# Patient Record
Sex: Male | Born: 1960
Health system: Southern US, Community
[De-identification: ages and names within clinical notes are randomized; demographics above are authoritative.]

## PROBLEM LIST (undated history)

## (undated) DIAGNOSIS — I619 Nontraumatic intracerebral hemorrhage, unspecified: Secondary | ICD-10-CM

## (undated) DIAGNOSIS — J869 Pyothorax without fistula: Secondary | ICD-10-CM

## (undated) DIAGNOSIS — E119 Type 2 diabetes mellitus without complications: Secondary | ICD-10-CM

## (undated) DIAGNOSIS — G9340 Encephalopathy, unspecified: Secondary | ICD-10-CM

## (undated) DIAGNOSIS — I5020 Unspecified systolic (congestive) heart failure: Secondary | ICD-10-CM

## (undated) DIAGNOSIS — I429 Cardiomyopathy, unspecified: Secondary | ICD-10-CM

## (undated) DIAGNOSIS — L732 Hidradenitis suppurativa: Secondary | ICD-10-CM

## (undated) DIAGNOSIS — K659 Peritonitis, unspecified: Secondary | ICD-10-CM

## (undated) DIAGNOSIS — N289 Disorder of kidney and ureter, unspecified: Secondary | ICD-10-CM

## (undated) DIAGNOSIS — A419 Sepsis, unspecified organism: Secondary | ICD-10-CM

## (undated) DIAGNOSIS — I639 Cerebral infarction, unspecified: Secondary | ICD-10-CM

## (undated) DIAGNOSIS — K5090 Crohn's disease, unspecified, without complications: Secondary | ICD-10-CM

## (undated) DIAGNOSIS — I82409 Acute embolism and thrombosis of unspecified deep veins of unspecified lower extremity: Secondary | ICD-10-CM

## (undated) DIAGNOSIS — D649 Anemia, unspecified: Secondary | ICD-10-CM

## (undated) DIAGNOSIS — I2510 Atherosclerotic heart disease of native coronary artery without angina pectoris: Secondary | ICD-10-CM

## (undated) DIAGNOSIS — M009 Pyogenic arthritis, unspecified: Secondary | ICD-10-CM

## (undated) DIAGNOSIS — I10 Essential (primary) hypertension: Secondary | ICD-10-CM

## (undated) DIAGNOSIS — N186 End stage renal disease: Secondary | ICD-10-CM

## (undated) HISTORY — DX: Cardiomyopathy, unspecified: I42.9

## (undated) HISTORY — DX: Unspecified systolic (congestive) heart failure: I50.20

## (undated) HISTORY — DX: End stage renal disease: N18.6

## (undated) HISTORY — PX: ANGIOPLASTY: SHX39

## (undated) HISTORY — PX: ABDOMINAL SURGERY: SHX537

## (undated) HISTORY — PX: COLONOSCOPY: SHX174

## (undated) HISTORY — DX: Pyogenic arthritis, unspecified: M00.9

## (undated) HISTORY — DX: Sepsis, unspecified organism: A41.9

## (undated) HISTORY — DX: Atherosclerotic heart disease of native coronary artery without angina pectoris: I25.10

## (undated) HISTORY — DX: Peritonitis, unspecified: K65.9

---
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
## 2009-02-24 ENCOUNTER — Ambulatory Visit: Payer: Self-pay | Admitting: Family Medicine

## 2010-02-02 ENCOUNTER — Ambulatory Visit: Payer: Self-pay | Admitting: Nephrology

## 2011-08-03 DIAGNOSIS — K5090 Crohn's disease, unspecified, without complications: Secondary | ICD-10-CM | POA: Insufficient documentation

## 2011-08-21 ENCOUNTER — Emergency Department: Payer: Self-pay | Admitting: Emergency Medicine

## 2012-03-28 DIAGNOSIS — E11649 Type 2 diabetes mellitus with hypoglycemia without coma: Secondary | ICD-10-CM | POA: Insufficient documentation

## 2012-03-28 DIAGNOSIS — Z794 Long term (current) use of insulin: Secondary | ICD-10-CM | POA: Insufficient documentation

## 2012-03-28 DIAGNOSIS — R809 Proteinuria, unspecified: Secondary | ICD-10-CM | POA: Insufficient documentation

## 2012-03-28 DIAGNOSIS — E1129 Type 2 diabetes mellitus with other diabetic kidney complication: Secondary | ICD-10-CM | POA: Insufficient documentation

## 2013-01-25 DIAGNOSIS — N185 Chronic kidney disease, stage 5: Secondary | ICD-10-CM | POA: Insufficient documentation

## 2013-01-25 DIAGNOSIS — E559 Vitamin D deficiency, unspecified: Secondary | ICD-10-CM | POA: Insufficient documentation

## 2013-08-13 DIAGNOSIS — N4340 Spermatocele of epididymis, unspecified: Secondary | ICD-10-CM | POA: Insufficient documentation

## 2013-08-13 DIAGNOSIS — N138 Other obstructive and reflux uropathy: Secondary | ICD-10-CM | POA: Insufficient documentation

## 2013-09-11 DIAGNOSIS — R972 Elevated prostate specific antigen [PSA]: Secondary | ICD-10-CM | POA: Insufficient documentation

## 2013-11-02 DIAGNOSIS — N4231 Prostatic intraepithelial neoplasia: Secondary | ICD-10-CM | POA: Insufficient documentation

## 2014-07-24 ENCOUNTER — Ambulatory Visit: Payer: Self-pay | Admitting: Internal Medicine

## 2014-07-24 LAB — HEMOGLOBIN A1C: Hemoglobin A1C: 8.3 % — ABNORMAL HIGH (ref 4.2–6.3)

## 2014-07-24 LAB — CBC WITH DIFFERENTIAL/PLATELET
Basophil #: 0.1 10*3/uL (ref 0.0–0.1)
Basophil %: 0.3 %
Eosinophil #: 0 10*3/uL (ref 0.0–0.7)
Eosinophil %: 0.1 %
HCT: 29.2 % — ABNORMAL LOW (ref 40.0–52.0)
HGB: 8.8 g/dL — ABNORMAL LOW (ref 13.0–18.0)
Lymphocyte #: 2.2 10*3/uL (ref 1.0–3.6)
Lymphocyte %: 9 %
MCH: 25.2 pg — ABNORMAL LOW (ref 26.0–34.0)
MCHC: 30.2 g/dL — ABNORMAL LOW (ref 32.0–36.0)
MCV: 83 fL (ref 80–100)
Monocyte #: 1.2 x10 3/mm — ABNORMAL HIGH (ref 0.2–1.0)
Monocyte %: 5.1 %
Neutrophil #: 21 10*3/uL — ABNORMAL HIGH (ref 1.4–6.5)
Neutrophil %: 85.5 %
Platelet: 279 10*3/uL (ref 150–440)
RBC: 3.5 10*6/uL — ABNORMAL LOW (ref 4.40–5.90)
RDW: 15.7 % — ABNORMAL HIGH (ref 11.5–14.5)
WBC: 24.6 10*3/uL — ABNORMAL HIGH (ref 3.8–10.6)

## 2014-07-24 LAB — BASIC METABOLIC PANEL
Anion Gap: 10 (ref 7–16)
BUN: 62 mg/dL — ABNORMAL HIGH (ref 7–18)
Calcium, Total: 9.3 mg/dL (ref 8.5–10.1)
Chloride: 103 mmol/L (ref 98–107)
Co2: 21 mmol/L (ref 21–32)
Creatinine: 4.95 mg/dL — ABNORMAL HIGH (ref 0.60–1.30)
EGFR (African American): 16 — ABNORMAL LOW
EGFR (Non-African Amer.): 13 — ABNORMAL LOW
Glucose: 168 mg/dL — ABNORMAL HIGH (ref 65–99)
Osmolality: 290 (ref 275–301)
Potassium: 4.3 mmol/L (ref 3.5–5.1)
Sodium: 134 mmol/L — ABNORMAL LOW (ref 136–145)

## 2014-07-24 LAB — PROTEIN / CREATININE RATIO, URINE
Creatinine, Urine: 231.6 mg/dL — ABNORMAL HIGH (ref 30.0–125.0)
Protein, Random Urine: 225 mg/dL — ABNORMAL HIGH (ref 0–12)
Protein/Creat. Ratio: 971 mg/gCREAT — ABNORMAL HIGH (ref 0–200)

## 2014-08-16 DIAGNOSIS — I82409 Acute embolism and thrombosis of unspecified deep veins of unspecified lower extremity: Secondary | ICD-10-CM

## 2014-08-16 DIAGNOSIS — Z86718 Personal history of other venous thrombosis and embolism: Secondary | ICD-10-CM | POA: Insufficient documentation

## 2014-09-04 DIAGNOSIS — I872 Venous insufficiency (chronic) (peripheral): Secondary | ICD-10-CM | POA: Insufficient documentation

## 2014-10-18 DIAGNOSIS — I82409 Acute embolism and thrombosis of unspecified deep veins of unspecified lower extremity: Secondary | ICD-10-CM

## 2014-10-18 HISTORY — DX: Acute embolism and thrombosis of unspecified deep veins of unspecified lower extremity: I82.409

## 2015-01-20 DIAGNOSIS — E872 Acidosis, unspecified: Secondary | ICD-10-CM | POA: Insufficient documentation

## 2015-02-26 ENCOUNTER — Other Ambulatory Visit: Payer: Self-pay | Admitting: Family Medicine

## 2015-02-26 ENCOUNTER — Ambulatory Visit
Admission: RE | Admit: 2015-02-26 | Discharge: 2015-02-26 | Disposition: A | Discharge status: Home / Self Care | Payer: BLUE CROSS/BLUE SHIELD | Source: Ambulatory Visit | Attending: Family Medicine | Admitting: Family Medicine

## 2015-02-26 DIAGNOSIS — R042 Hemoptysis: Secondary | ICD-10-CM

## 2015-05-26 DIAGNOSIS — N189 Chronic kidney disease, unspecified: Secondary | ICD-10-CM | POA: Insufficient documentation

## 2015-05-26 DIAGNOSIS — D631 Anemia in chronic kidney disease: Secondary | ICD-10-CM | POA: Insufficient documentation

## 2015-08-25 ENCOUNTER — Other Ambulatory Visit: Payer: Self-pay | Admitting: Family Medicine

## 2015-08-25 DIAGNOSIS — N184 Chronic kidney disease, stage 4 (severe): Principal | ICD-10-CM

## 2015-08-25 DIAGNOSIS — E1122 Type 2 diabetes mellitus with diabetic chronic kidney disease: Secondary | ICD-10-CM

## 2015-08-29 ENCOUNTER — Other Ambulatory Visit: Payer: Self-pay | Admitting: Family Medicine

## 2015-08-29 DIAGNOSIS — R609 Edema, unspecified: Secondary | ICD-10-CM | POA: Insufficient documentation

## 2015-08-29 DIAGNOSIS — I999 Unspecified disorder of circulatory system: Secondary | ICD-10-CM | POA: Insufficient documentation

## 2015-08-29 DIAGNOSIS — E785 Hyperlipidemia, unspecified: Secondary | ICD-10-CM | POA: Insufficient documentation

## 2015-08-29 DIAGNOSIS — N529 Male erectile dysfunction, unspecified: Secondary | ICD-10-CM | POA: Insufficient documentation

## 2015-08-29 DIAGNOSIS — E7800 Pure hypercholesterolemia, unspecified: Secondary | ICD-10-CM | POA: Insufficient documentation

## 2015-08-29 DIAGNOSIS — I10 Essential (primary) hypertension: Secondary | ICD-10-CM | POA: Insufficient documentation

## 2015-08-29 DIAGNOSIS — M9979 Connective tissue and disc stenosis of intervertebral foramina of abdomen and other regions: Secondary | ICD-10-CM | POA: Insufficient documentation

## 2015-08-29 NOTE — Telephone Encounter (Signed)
Last OV 02/2015  Thanks,   -Mickel Baas

## 2015-09-28 ENCOUNTER — Other Ambulatory Visit: Payer: Self-pay | Admitting: Family Medicine

## 2015-09-28 DIAGNOSIS — I10 Essential (primary) hypertension: Secondary | ICD-10-CM

## 2015-10-23 ENCOUNTER — Other Ambulatory Visit: Payer: Self-pay | Admitting: Family Medicine

## 2015-10-23 DIAGNOSIS — E1122 Type 2 diabetes mellitus with diabetic chronic kidney disease: Secondary | ICD-10-CM

## 2015-10-23 DIAGNOSIS — N184 Chronic kidney disease, stage 4 (severe): Principal | ICD-10-CM

## 2015-10-23 NOTE — Telephone Encounter (Signed)
Last OV 02/2015  Thanks,   -Taelyn Broecker  

## 2015-11-06 ENCOUNTER — Emergency Department: Payer: BLUE CROSS/BLUE SHIELD

## 2015-11-06 ENCOUNTER — Inpatient Hospital Stay
Admission: EM | Admit: 2015-11-06 | Discharge: 2015-11-07 | DRG: 683 | Disposition: A | Discharge status: Home / Self Care | Payer: BLUE CROSS/BLUE SHIELD | Attending: Internal Medicine | Admitting: Internal Medicine

## 2015-11-06 ENCOUNTER — Encounter: Payer: Self-pay | Admitting: Emergency Medicine

## 2015-11-06 DIAGNOSIS — Z7682 Awaiting organ transplant status: Secondary | ICD-10-CM

## 2015-11-06 DIAGNOSIS — Z79899 Other long term (current) drug therapy: Secondary | ICD-10-CM

## 2015-11-06 DIAGNOSIS — E11649 Type 2 diabetes mellitus with hypoglycemia without coma: Secondary | ICD-10-CM | POA: Diagnosis present

## 2015-11-06 DIAGNOSIS — N184 Chronic kidney disease, stage 4 (severe): Secondary | ICD-10-CM | POA: Diagnosis present

## 2015-11-06 DIAGNOSIS — I120 Hypertensive chronic kidney disease with stage 5 chronic kidney disease or end stage renal disease: Secondary | ICD-10-CM | POA: Diagnosis present

## 2015-11-06 DIAGNOSIS — Z86718 Personal history of other venous thrombosis and embolism: Secondary | ICD-10-CM

## 2015-11-06 DIAGNOSIS — N179 Acute kidney failure, unspecified: Secondary | ICD-10-CM | POA: Diagnosis present

## 2015-11-06 DIAGNOSIS — Z992 Dependence on renal dialysis: Secondary | ICD-10-CM

## 2015-11-06 DIAGNOSIS — D631 Anemia in chronic kidney disease: Secondary | ICD-10-CM | POA: Diagnosis present

## 2015-11-06 DIAGNOSIS — Z881 Allergy status to other antibiotic agents status: Secondary | ICD-10-CM | POA: Diagnosis not present

## 2015-11-06 DIAGNOSIS — K5090 Crohn's disease, unspecified, without complications: Secondary | ICD-10-CM | POA: Diagnosis present

## 2015-11-06 DIAGNOSIS — Z794 Long term (current) use of insulin: Secondary | ICD-10-CM | POA: Diagnosis not present

## 2015-11-06 DIAGNOSIS — Z888 Allergy status to other drugs, medicaments and biological substances status: Secondary | ICD-10-CM | POA: Diagnosis not present

## 2015-11-06 DIAGNOSIS — E1122 Type 2 diabetes mellitus with diabetic chronic kidney disease: Secondary | ICD-10-CM | POA: Diagnosis present

## 2015-11-06 DIAGNOSIS — N186 End stage renal disease: Secondary | ICD-10-CM | POA: Diagnosis present

## 2015-11-06 DIAGNOSIS — N19 Unspecified kidney failure: Secondary | ICD-10-CM

## 2015-11-06 DIAGNOSIS — E162 Hypoglycemia, unspecified: Secondary | ICD-10-CM

## 2015-11-06 HISTORY — DX: Hidradenitis suppurativa: L73.2

## 2015-11-06 HISTORY — DX: Acute embolism and thrombosis of unspecified deep veins of unspecified lower extremity: I82.409

## 2015-11-06 HISTORY — DX: Crohn's disease, unspecified, without complications: K50.90

## 2015-11-06 HISTORY — DX: Anemia, unspecified: D64.9

## 2015-11-06 HISTORY — DX: Essential (primary) hypertension: I10

## 2015-11-06 HISTORY — DX: Disorder of kidney and ureter, unspecified: N28.9

## 2015-11-06 HISTORY — DX: Type 2 diabetes mellitus without complications: E11.9

## 2015-11-06 LAB — CBC
HCT: 26.6 % — ABNORMAL LOW (ref 40.0–52.0)
Hemoglobin: 8.5 g/dL — ABNORMAL LOW (ref 13.0–18.0)
MCH: 25.4 pg — ABNORMAL LOW (ref 26.0–34.0)
MCHC: 31.9 g/dL — ABNORMAL LOW (ref 32.0–36.0)
MCV: 79.3 fL — ABNORMAL LOW (ref 80.0–100.0)
Platelets: 179 10*3/uL (ref 150–440)
RBC: 3.35 MIL/uL — ABNORMAL LOW (ref 4.40–5.90)
RDW: 18.4 % — ABNORMAL HIGH (ref 11.5–14.5)
WBC: 7.1 10*3/uL (ref 3.8–10.6)

## 2015-11-06 LAB — GLUCOSE, CAPILLARY
Glucose-Capillary: 38 mg/dL — CL (ref 65–99)
Glucose-Capillary: 55 mg/dL — ABNORMAL LOW (ref 65–99)
Glucose-Capillary: 72 mg/dL (ref 65–99)
Glucose-Capillary: 119 mg/dL — ABNORMAL HIGH (ref 65–99)
Glucose-Capillary: 143 mg/dL — ABNORMAL HIGH (ref 65–99)
Glucose-Capillary: 143 mg/dL — ABNORMAL HIGH (ref 65–99)

## 2015-11-06 LAB — MAGNESIUM: Magnesium: 1.5 mg/dL — ABNORMAL LOW (ref 1.7–2.4)

## 2015-11-06 LAB — COMPREHENSIVE METABOLIC PANEL
ALT: 12 U/L — ABNORMAL LOW (ref 17–63)
AST: 17 U/L (ref 15–41)
Albumin: 3.3 g/dL — ABNORMAL LOW (ref 3.5–5.0)
Alkaline Phosphatase: 74 U/L (ref 38–126)
Anion gap: 12 (ref 5–15)
BUN: 67 mg/dL — ABNORMAL HIGH (ref 6–20)
CO2: 22 mmol/L (ref 22–32)
Calcium: 6.4 mg/dL — CL (ref 8.9–10.3)
Chloride: 106 mmol/L (ref 101–111)
Creatinine, Ser: 6.93 mg/dL — ABNORMAL HIGH (ref 0.61–1.24)
GFR calc Af Amer: 9 mL/min — ABNORMAL LOW (ref >60)
GFR calc non Af Amer: 8 mL/min — ABNORMAL LOW (ref >60)
Glucose, Bld: 42 mg/dL — CL (ref 65–99)
Potassium: 4.1 mmol/L (ref 3.5–5.1)
Sodium: 140 mmol/L (ref 135–145)
Total Bilirubin: 0.4 mg/dL (ref 0.3–1.2)
Total Protein: 7.8 g/dL (ref 6.5–8.1)

## 2015-11-06 MED ORDER — HEPARIN SODIUM (PORCINE) 5000 UNIT/ML IJ SOLN
5000.0000 [IU] | Freq: Three times a day (TID) | INTRAMUSCULAR | Status: DC
  Administered 2015-11-06 – 2015-11-07 (×2): 5000 [IU] via SUBCUTANEOUS
  Filled 2015-11-06 (×2): qty 1

## 2015-11-06 MED ORDER — MAGNESIUM SULFATE 2 GM/50ML IV SOLN
2.0000 g | Freq: Once | INTRAVENOUS | Status: AC
  Administered 2015-11-06: 2 g via INTRAVENOUS
  Filled 2015-11-06: qty 50

## 2015-11-06 MED ORDER — CARVEDILOL 12.5 MG PO TABS
12.5000 mg | ORAL_TABLET | Freq: Two times a day (BID) | ORAL | Status: DC
  Administered 2015-11-06 – 2015-11-07 (×2): 12.5 mg via ORAL
  Filled 2015-11-06 (×4): qty 1

## 2015-11-06 MED ORDER — SODIUM CHLORIDE 0.9 % IV SOLN
1.0000 g | Freq: Once | INTRAVENOUS | Status: AC
  Administered 2015-11-06: 1 g via INTRAVENOUS
  Filled 2015-11-06: qty 10

## 2015-11-06 MED ORDER — CALCITRIOL 0.25 MCG PO CAPS
0.5000 ug | ORAL_CAPSULE | ORAL | Status: DC
  Administered 2015-11-06 (×2): 0.5 ug via ORAL
  Filled 2015-11-06: qty 1

## 2015-11-06 MED ORDER — INSULIN ASPART 100 UNIT/ML ~~LOC~~ SOLN
0.0000 [IU] | Freq: Three times a day (TID) | SUBCUTANEOUS | Status: DC
Start: 2015-11-07 — End: 2015-11-07
  Administered 2015-11-07: 1 [IU] via SUBCUTANEOUS
  Filled 2015-11-06: qty 1

## 2015-11-06 MED ORDER — SODIUM CHLORIDE 0.9 % IV SOLN
INTRAVENOUS | Status: DC
  Administered 2015-11-06 – 2015-11-07 (×2): via INTRAVENOUS

## 2015-11-06 MED ORDER — AMLODIPINE BESYLATE 10 MG PO TABS
10.0000 mg | ORAL_TABLET | Freq: Every day | ORAL | Status: DC
  Administered 2015-11-06 – 2015-11-07 (×2): 10 mg via ORAL
  Filled 2015-11-06 (×2): qty 1

## 2015-11-06 MED ORDER — ACETAMINOPHEN 650 MG RE SUPP: 650.0000 mg | Freq: Four times a day (QID) | RECTAL | Status: DC | PRN

## 2015-11-06 MED ORDER — INSULIN ASPART 100 UNIT/ML ~~LOC~~ SOLN
0.0000 [IU] | Freq: Every day | SUBCUTANEOUS | Status: DC
Start: 2015-11-06 — End: 2015-11-07

## 2015-11-06 MED ORDER — ACETAMINOPHEN 325 MG PO TABS: 650.0000 mg | ORAL_TABLET | Freq: Four times a day (QID) | ORAL | Status: DC | PRN

## 2015-11-06 MED ORDER — PRAVASTATIN SODIUM 20 MG PO TABS
40.0000 mg | ORAL_TABLET | Freq: Every day | ORAL | Status: DC
  Administered 2015-11-06 – 2015-11-07 (×2): 40 mg via ORAL
  Filled 2015-11-06 (×3): qty 2

## 2015-11-06 MED ORDER — DEXTROSE 50 % IV SOLN
1.0000 | Freq: Once | INTRAVENOUS | Status: AC
  Administered 2015-11-06: 50 mL via INTRAVENOUS
  Filled 2015-11-06: qty 50

## 2015-11-06 MED ORDER — SODIUM CHLORIDE 0.9 % IV BOLUS (SEPSIS)
500.0000 mL | Freq: Once | INTRAVENOUS | Status: AC
  Administered 2015-11-06: 500 mL via INTRAVENOUS

## 2015-11-06 NOTE — ED Notes (Signed)
Admitting MD at bedside.

## 2015-11-06 NOTE — ED Provider Notes (Signed)
[facility] Emergency Department Provider Note  ____________________________________________  Time seen: Approximately 3:03 PM  I have reviewed the triage vital signs and the nursing notes.   HISTORY  Chief Complaint Hypoglycemia    HPI [name] is a 55 y.o. male with CKD IV, hypertension, Crohn's disease,  Anemia, history of DVT in the left lower extremity, not chronically anticoagulated, diabetes treated only with sliding scale Humalog and nightly Lantus who presents for evaluation of hypoglycemia today, currently severe, no modifying factors. The patient reports that he awoke this morning in his usual state of health and took 15 units of Humalog without checking his blood sugar. He reports he usually does this and there has been no change in his insulin regimen recently. He then ate a sausage biscuit and about an hour later began feeling "like my sugar was dropping". He reports that he was going to go to the vending machine to eat a candy bar however his coworker told him "I was acting funny". He does not think he fainted but his wife received a phone call from coworkers stating that he might have fainted. He reports he feels a little bit better after eating. He denies any pain complaints. He denies any chest pain, difficulty breathing, palpitations, headache, numbness, weakness. No recent cough, sneezing, runny nose, congestion, vomiting, diarrhea, fevers or chills. He was treated for cellulitis of the left leg several weeks ago but reports that that has resolved. He reports chronic intermittent swelling of the left lower extremity since his diagnosis of DVT approximately a year ago.   Past Medical History  Diagnosis Date  . Diabetes mellitus without complication ([place])   . Renal disorder   . Hypertension   . Crohn disease [facility])     Patient Active Problem List   Diagnosis Date Noted  . Narrowing of intervertebral disc space 08/29/2015  . Vascular  disorder of lower extremity 08/29/2015  . Failure of erection 08/29/2015  . Accumulation of fluid in tissues 08/29/2015  . Hypercholesteremia 08/29/2015  . BP (high blood pressure) 08/29/2015  . Deep vein thrombosis of lower extremity ([place]) 08/16/2014  . Abnormal prostate specific antigen 09/11/2013  . Benign prostatic hyperplasia with urinary obstruction 08/13/2013  . CD (Crohn's disease) ([place]) 02/28/2013  . Diabetes ([place]) 01/25/2013  . CKD stage 4 due to type 2 diabetes mellitus ([place]) 01/25/2013  . Chronic kidney disease (CKD), stage III (moderate) 03/28/2012  . Absolute anemia 08/03/2011    Past Surgical History  Procedure Laterality Date  . Abdominal surgery      Current Outpatient Rx  Name  Route  Sig  Dispense  Refill  . acetaminophen (TYLENOL) 500 MG tablet   Oral   Take 500 mg by mouth every 6 (six) hours as needed.         Marland Kitchen amLODipine (NORVASC) 10 MG tablet      TAKE ONE TABLET BY MOUTH ONCE DAILY   30 tablet   0     Pt needs to schedule a follow up office visit befo ...   . calcitRIOL (ROCALTROL) 0.25 MCG capsule   Oral   Take 0.5 mcg by mouth every other day.      1   . carvedilol (COREG) 12.5 MG tablet   Oral   Take 12.5 mg by mouth 2 (two) times daily.      3   . furosemide (LASIX) 40 MG tablet   Oral   Take 20-40 mg by mouth daily. Take 40  mg in morning and 20 mg at night      4   . LANTUS SOLOSTAR 100 UNIT/ML Solostar Pen      INJECT 45 UNITS SUBCUTANEOUSLY ONCE DAILY. INCREASE BY 1 UNIT A DAY UNTIL SUGARS ARE IMPROVED   15 pen   1   . pravastatin (PRAVACHOL) 40 MG tablet   Oral   Take 40 mg by mouth daily.      2     Allergies Vancomycin; Methotrexate; and Tape  No family history on file.  Social History Social History  Substance Use Topics  . Smoking status: Never Smoker   . Smokeless tobacco: None  . Alcohol Use: No    Review of Systems Constitutional: No fever/chills Eyes: No visual changes. ENT: No sore  throat. Cardiovascular: Denies chest pain. Respiratory: Denies shortness of breath. Gastrointestinal: No abdominal pain.  No nausea, no vomiting.  No diarrhea.  No constipation. Genitourinary: Negative for dysuria. Musculoskeletal: Negative for back pain. Skin: Negative for rash. Neurological: Negative for headaches, focal weakness or numbness.  10-point ROS otherwise negative.  ____________________________________________   PHYSICAL EXAM:  VITAL SIGNS: ED Triage Vitals  Enc Vitals Group     BP 11/06/15 1414 162/78 mmHg     Pulse Rate 11/06/15 1414 56     Resp 11/06/15 1414 16     Temp --      Temp src --      SpO2 11/06/15 1414 98 %     Weight 11/06/15 1414 250 lb (113.399 kg)     Height 11/06/15 1414 5' 10"  (1.778 m)     Head Cir --      Peak Flow --      Pain Score --      Pain Loc --      Pain Edu? --      Excl. in [place]? --     Constitutional: Alert and oriented. Well appearing and in no acute distress. Sitting up in bed, eating graham crackers, drinking milk. Eyes: Conjunctivae are normal. PERRL. EOMI. Head: Atraumatic. Nose: No congestion/rhinnorhea. Mouth/Throat: Mucous membranes are moist.  Oropharynx non-erythematous. Neck: No stridor. Cardiovascular: Normal rate, regular rhythm. Grossly normal heart sounds.  Good peripheral circulation. Respiratory: Normal respiratory effort.  No retractions. Lungs CTAB. Gastrointestinal: Soft and nontender. No distention. No CVA tenderness. Genitourinary: deferred Musculoskeletal: 2+ edema of the left lower extremity with venous stasis color change of the lower leg, palpable DP pulse bilaterally, no calf tenderness Neurologic:  Normal speech and language. No gross focal neurologic deficits are appreciated. No gait instability. 5 out of 5 strength in bilateral upper and lower extremities, sensation intact to light touch throughout. Cranial nerves II through XII intact. Skin:  Skin is warm, dry and intact. No rash  noted. Psychiatric: Mood and affect are normal. Speech and behavior are normal.  ____________________________________________   LABS (all labs ordered are listed, but only abnormal results are displayed)  Labs Reviewed  COMPREHENSIVE METABOLIC PANEL - Abnormal; Notable for the following:    Glucose, Bld 42 (*)    BUN 67 (*)    Creatinine, Ser 6.93 (*)    Calcium 6.4 (*)    Albumin 3.3 (*)    ALT 12 (*)    GFR calc non Af Amer 8 (*)    GFR calc Af Amer 9 (*)    All other components within normal limits  CBC - Abnormal; Notable for the following:    RBC 3.35 (*)    Hemoglobin 8.5 (*)  HCT 26.6 (*)    MCV 79.3 (*)    MCH 25.4 (*)    MCHC 31.9 (*)    RDW 18.4 (*)    All other components within normal limits  GLUCOSE, CAPILLARY - Abnormal; Notable for the following:    Glucose-Capillary 38 (*)    All other components within normal limits  GLUCOSE, CAPILLARY - Abnormal; Notable for the following:    Glucose-Capillary 55 (*)    All other components within normal limits  MAGNESIUM - Abnormal; Notable for the following:    Magnesium 1.5 (*)    All other components within normal limits  GLUCOSE, CAPILLARY - Abnormal; Notable for the following:    Glucose-Capillary 119 (*)    All other components within normal limits  GLUCOSE, CAPILLARY - Abnormal; Notable for the following:    Glucose-Capillary 143 (*)    All other components within normal limits  GLUCOSE, CAPILLARY - Abnormal; Notable for the following:    Glucose-Capillary 143 (*)    All other components within normal limits  GLUCOSE, CAPILLARY  CBG MONITORING, ED   ____________________________________________  EKG  ED ECG REPORT I, Joanne Gavel, the attending physician, personally viewed and interpreted this ECG.   Date: 11/06/2015  EKG Time: 14:21  Rate: 55  Rhythm: atrial fibrillation, rate 55  Axis: normal  Intervals: QTCs lightly prolonged at 526 ms.  ST&T Change: No acute ST elevation. No acute ST  depression. ____________________________________________  RADIOLOGY  CT head IMPRESSION: 1. No acute intracranial abnormalities. 2. Mild chronic microvascular ischemic changes in the cerebral white matter, as above.  CXR IMPRESSION: 1. No acute intracranial abnormalities. 2. Mild chronic microvascular ischemic changes in the cerebral white matter, as above.  Doppler ultrasound left leg  IMPRESSION: No evidence of deep venous thrombosis. ____________________________________________   PROCEDURES  Procedure(s) performed: None  Critical Care performed: No  ____________________________________________   INITIAL IMPRESSION / ASSESSMENT AND PLAN / ED COURSE  Pertinent labs & imaging results that were available during my care of the patient were reviewed by me and considered in my medical decision making (see chart for details).  Creedence B Asfour is a 55 y.o. male with CKD IV, hypertension, Crohn's disease, history of DVT in the left lower extremity, not chronically anticoagulated, diabetes treated only with sliding scale Humalog and nightly Lantus who presents for evaluation of hypoglycemia today as well as possibly altered mental status which has resolved. It is unclear whether not he had a fainting spell or not as there are conflicting details. On exam, he is very well-appearing and in no acute distress. Vital signs are stable, he is afebrile. He is an intact neurological examination. He is alert and oriented. His glucose on arrival was 38, improved only to 55 after eating. We'll give an amp of D50. We'll obtain basic labs, screening EKG. Given that it is unclear whether not he fainted today, we'll obtain screening x-ray and CT head. We'll check his blood sugar every hour and reassess for disposition.  ----------------------------------------- 6:45 PM on 11/06/2015 ----------------------------------------- Patient reports that he feels well. He has had a several normal glucoses  over the past 3 hours. Labs reviewed and are notable for a creatinine elevation at 6.93, baseline appears to be closer to 4.5. I have ordered IV fluids. Calcium is critically low at 6.4. We'll give calcium. Magnesium was low and repleted. QTC has narrowed from 526 ms to 513 ms on repeat EKG after Mg repletion. Case discussed with hospitalist for admission. Case discussed  with Dr. Holley Raring of nephrology as well. ____________________________________________   FINAL CLINICAL IMPRESSION(S) / ED DIAGNOSES  Final diagnoses:  Hypoglycemia      Joanne Gavel, MD 11/06/15 2315

## 2015-11-06 NOTE — ED Notes (Signed)
Blood Sugar 72

## 2015-11-06 NOTE — ED Notes (Signed)
Attempted to call report. The receiving nurse was in an isolation room. Gave my name and number and waiting for a call back.

## 2015-11-06 NOTE — ED Notes (Signed)
Brought in via ems from work  Per ems he had low blood sugar       Pt is diaphoretic and sl lethargic

## 2015-11-06 NOTE — H&P (Signed)
[facility] [facility] NAME: [name]    MR#:  [id number]  DATE OF BIRTH:  1961-07-24  DATE OF ADMISSION:  11/06/2015  PRIMARY CARE PHYSICIAN: Margarita Rana, MD   REQUESTING/REFERRING PHYSICIAN: Dr. Edd Fabian  CHIEF COMPLAINT:   Chief Complaint  Patient presents with  . Hypoglycemia    HISTORY OF PRESENT ILLNESS:  [name]  is a 55 y.o. male with a known history of end-stage renal disease approaching need for hemodialysis, diabetes mellitus type 2 requiring insulin, hypertension presents today with syncope due to hypoglycemia. He was in his usual state of health this morning, ate breakfast, took his usual dose of Humalog without checking his blood sugar. About 1 hour later he became jittery and sweaty, he was unable to find anything to eat and became disoriented. Coworkers saw him standing at the water fountain looking very confused. They put him in a chair and he had possible syncopal episode. He did not have any palpitation, chest pain, shortness of breath or focal neurologic changes. On presentation to the emergency room his blood sugar is 38. He is also found to be in acute on chronic renal failure with worsening creatinine from 5.9 in 09/2015 to 6.9 today.  PAST MEDICAL HISTORY:   Past Medical History  Diagnosis Date  . Diabetes mellitus without complication ([place])   . Renal disorder   . Hypertension   . Crohn disease ([place])   . Hidradenitis suppurativa   . Anemia   . DVT of lower extremity (deep venous thrombosis) ([place]) 2016    PAST SURGICAL HISTORY:   Past Surgical History  Procedure Laterality Date  . Abdominal surgery      SOCIAL HISTORY:   Social History  Substance Use Topics  . Smoking status: Never Smoker   . Smokeless tobacco: Not on file  . Alcohol Use: No    FAMILY HISTORY:   Family History  Problem Relation Age of Onset  . Irritable bowel syndrome Sister     DRUG ALLERGIES:   Allergies   Allergen Reactions  . Vancomycin Shortness Of Breath    Eyes watering, SOB, wheezing  . Methotrexate Other (See Comments)    Blood count drops  . Tape     REVIEW OF SYSTEMS:   Review of Systems  Constitutional: Positive for malaise/fatigue. Negative for fever, chills and weight loss.  HENT: Negative for congestion and hearing loss.   Eyes: Negative for blurred vision and pain.  Respiratory: Negative for cough, hemoptysis, sputum production, shortness of breath and stridor.   Cardiovascular: Positive for leg swelling. Negative for chest pain, palpitations and orthopnea.  Gastrointestinal: Negative for nausea, vomiting, abdominal pain, diarrhea, constipation and blood in stool.  Genitourinary: Negative for dysuria and frequency.  Musculoskeletal: Negative for myalgias, back pain, joint pain and neck pain.  Skin: Negative for rash.  Neurological: Positive for dizziness, tingling, loss of consciousness and weakness. Negative for focal weakness and headaches.  Endo/Heme/Allergies: Does not bruise/bleed easily.  Psychiatric/Behavioral: Negative for depression and hallucinations. The patient is not nervous/anxious.     MEDICATIONS AT HOME:   Prior to Admission medications   Medication Sig Start Date End Date Taking? Authorizing Provider  acetaminophen (TYLENOL) 500 MG tablet Take 500 mg by mouth every 6 (six) hours as needed.   Yes Historical Provider, MD  amLODipine (NORVASC) 10 MG tablet TAKE ONE TABLET BY MOUTH ONCE DAILY 08/29/15  Yes Margarita Rana, MD  calcitRIOL (ROCALTROL) 0.25 MCG capsule Take  0.5 mcg by mouth every other day. 11/02/15  Yes Historical Provider, MD  carvedilol (COREG) 12.5 MG tablet Take 12.5 mg by mouth 2 (two) times daily. 11/02/15  Yes Historical Provider, MD  furosemide (LASIX) 40 MG tablet Take 20-40 mg by mouth daily. Take 40 mg in morning and 20 mg at night 10/24/15  Yes Historical Provider, MD  LANTUS SOLOSTAR 100 UNIT/ML Solostar Pen INJECT 45 UNITS  SUBCUTANEOUSLY ONCE DAILY. INCREASE BY 1 UNIT A DAY UNTIL SUGARS ARE IMPROVED 10/23/15  Yes Margarita Rana, MD  pravastatin (PRAVACHOL) 40 MG tablet Take 40 mg by mouth daily. 10/24/15  Yes Historical Provider, MD      VITAL SIGNS:  Blood pressure 159/93, pulse 73, resp. rate 19, height 5\' 10"  (1.778 m), weight 113.399 kg (250 lb), SpO2 100 %.  PHYSICAL EXAMINATION:  GENERAL:  55 y.o.-year-old patient lying in the bed with no acute distress.  EYES: Pupils equal, round, reactive to light and accommodation. No scleral icterus. Extraocular muscles intact.  HEENT: Head atraumatic, normocephalic. Oropharynx and nasopharynx clear. Mucous membranes are pink and moist NECK:  Supple, no jugular venous distention. No thyroid enlargement, no tenderness.  LUNGS: Normal breath sounds bilaterally, no wheezing, rales,rhonchi or crepitation. No use of accessory muscles of respiration.  CARDIOVASCULAR: S1, S2 normal. No murmurs, rubs, or gallops.  ABDOMEN: Soft, nontender, nondistended. Bowel sounds present. No organomegaly or mass. No guarding no rebound EXTREMITIES: Trace pedal edema over right lower extremity, 1+ pedal edema over the left lower extremity with pitting, cyanosis, or clubbing.  NEUROLOGIC: Cranial nerves II through XII are intact. Muscle strength 5/5 in all extremities. Sensation intact. Gait not checked.  PSYCHIATRIC: The patient is alert and oriented x 3.  SKIN: No obvious rash, lesion, or ulcer.   LABORATORY PANEL:   CBC  Recent Labs Lab 11/06/15 1433  WBC 7.1  HGB 8.5*  HCT 26.6*  PLT 179   ------------------------------------------------------------------------------------------------------------------  Chemistries   Recent Labs Lab 11/06/15 1433  NA 140  K 4.1  CL 106  CO2 22  GLUCOSE 42*  BUN 67*  CREATININE 6.93*  CALCIUM 6.4*  MG 1.5*  AST 17  ALT 12*  ALKPHOS 74  BILITOT 0.4    ------------------------------------------------------------------------------------------------------------------  Cardiac Enzymes No results for input(s): TROPONINI in the last 168 hours. ------------------------------------------------------------------------------------------------------------------  RADIOLOGY:  Dg Chest 2 View  11/06/2015  CLINICAL DATA:  Pt had an episode at work today, his sugar dropped, syncope episode, edema in left leg EXAM: CHEST  2 VIEW COMPARISON:  02/26/2015 FINDINGS: The heart is enlarged. There are no focal consolidations or pleural effusions. No pulmonary edema. Mildly prominent interstitial markings appear stable. IMPRESSION: Cardiomegaly. Electronically Signed   By: Nolon Nations M.D.   On: 11/06/2015 16:54   Ct Head Wo Contrast  11/06/2015  CLINICAL DATA:  55 year old male with history of headaches and confusion. Low blood sugar. EXAM: CT HEAD WITHOUT CONTRAST TECHNIQUE: Contiguous axial images were obtained from the base of the skull through the vertex without intravenous contrast. COMPARISON:  No priors. FINDINGS: Patchy areas of decreased attenuation are noted throughout the deep and periventricular white matter of the cerebral hemispheres bilaterally, compatible with mild chronic microvascular ischemic disease. No acute intracranial abnormalities. Specifically, no evidence of acute intracranial hemorrhage, no definite findings of acute/subacute cerebral ischemia, no mass, mass effect, hydrocephalus or abnormal intra or extra-axial fluid collections. Visualized paranasal sinuses and mastoids are well pneumatized. No acute displaced skull fractures are identified. IMPRESSION: 1. No acute intracranial abnormalities. 2.  Mild chronic microvascular ischemic changes in the cerebral white matter, as above. Electronically Signed   By: Vinnie Langton M.D.   On: 11/06/2015 16:56   US Venous Img Lower Unilateral Left  11/06/2015  CLINICAL DATA:  55 year old male  with intermittent left lower extremity swelling for the past year EXAM: LEFT LOWER EXTREMITY VENOUS DOPPLER ULTRASOUND TECHNIQUE: Gray-scale sonography with graded compression, as well as color Doppler and duplex ultrasound were performed to evaluate the lower extremity deep venous systems from the level of the common femoral vein and including the common femoral, femoral, profunda femoral, popliteal and calf veins including the posterior tibial, peroneal and gastrocnemius veins when visible. The superficial great saphenous vein was also interrogated. Spectral Doppler was utilized to evaluate flow at rest and with distal augmentation maneuvers in the common femoral, femoral and popliteal veins. COMPARISON:  Prior duplex ultrasound 07/24/2014 FINDINGS: Contralateral Common Femoral Vein: Respiratory phasicity is normal and symmetric with the symptomatic side. No evidence of thrombus. Normal compressibility. Common Femoral Vein: No evidence of thrombus. Normal compressibility, respiratory phasicity and response to augmentation. Saphenofemoral Junction: No evidence of thrombus. Normal compressibility and flow on color Doppler imaging. Profunda Femoral Vein: No evidence of thrombus. Normal compressibility and flow on color Doppler imaging. Femoral Vein: No evidence of thrombus. Normal compressibility, respiratory phasicity and response to augmentation. Popliteal Vein: No evidence of thrombus. Normal compressibility, respiratory phasicity and response to augmentation. Calf Veins: No evidence of thrombus. Normal compressibility and flow on color Doppler imaging. Superficial Great Saphenous Vein: No evidence of thrombus. Normal compressibility and flow on color Doppler imaging. Venous Reflux:  None. Other Findings: Mildly prominent superficial inguinal lymph nodes in the left groin. These nodes were present on the prior ultrasound from October of 2015. There is been no significant interval change. IMPRESSION: No evidence of  deep venous thrombosis. Electronically Signed   By: Jacqulynn Cadet M.D.   On: 11/06/2015 17:28    EKG:   Orders placed or performed during the hospital encounter of 11/06/15  . ED EKG  . ED EKG  . EKG 12-Lead  . EKG 12-Lead  . ED EKG  . ED EKG  . EKG 12-Lead  . EKG 12-Lead    IMPRESSION AND PLAN:   1. Acute renal failure on CK D4: He has end-stage renal disease and is in the process of obtaining dialysis access as well as applying to the transplant list. Creatinine is worse today. We'll provide gentle hydration in hopes of improving renal function. [facility] consult nephrology. He usually follows with you and see nephrology.  2. Hypoglycemia: He reports that his oral intake has been normal. May need to adjust insulin doses due to worsening renal function. He has received 1 amp of D50 in the emergency room and blood sugars are now 143. Check A1c, start sliding scale insulin. Hold long acting insulin until blood sugars improve  3. Hypertension: Blood pressure well controlled at this time. Continue Norvasc and Coreg  4. Hypocalcemia: Has received calcium gluconate. Continue to monitor.  5. Hypomagnesemia: Has received 2 g of magnesium. Continue to monitor.  6. Anemia of chronic disease: Due to renal disease. Stable. Receives epogen through [facility] nephrology. No bleeding noted.  7. History of DVT: Reports that he took 6 months of warfarin and is now off of anticoagulation. Has chronic postphlebitic changes which are stable.  8. Prophylaxis: Heparin for DVT prophylaxis. No GI prophylaxis  All the records are reviewed and case discussed with ED provider. Management plans discussed  with the patient and his wife and they are in agreement.  CODE STATUS: Full  TOTAL TIME TAKING CARE OF THIS PATIENT:55 minutes.  Greater than 50% of time spent in coordination of care and counseling.  Myrtis Ser M.D on 11/06/2015 at 7:43 PM  Between 7am to 6pm - Pager - [phone]  After 6pm go to  [email or web address] - password [email or web address] [facility]  [place] [facility]  Office  [phone]  CC: Primary care physician; Margarita Rana, MD

## 2015-11-07 ENCOUNTER — Inpatient Hospital Stay: Payer: BLUE CROSS/BLUE SHIELD

## 2015-11-07 LAB — RENAL FUNCTION PANEL
Albumin: 2.8 g/dL — ABNORMAL LOW (ref 3.5–5.0)
Anion gap: 11 (ref 5–15)
BUN: 64 mg/dL — ABNORMAL HIGH (ref 6–20)
CO2: 21 mmol/L — ABNORMAL LOW (ref 22–32)
Calcium: 6.5 mg/dL — ABNORMAL LOW (ref 8.9–10.3)
Chloride: 108 mmol/L (ref 101–111)
Creatinine, Ser: 6.58 mg/dL — ABNORMAL HIGH (ref 0.61–1.24)
GFR calc Af Amer: 10 mL/min — ABNORMAL LOW (ref >60)
GFR calc non Af Amer: 9 mL/min — ABNORMAL LOW (ref >60)
Glucose, Bld: 73 mg/dL (ref 65–99)
Phosphorus: 6.8 mg/dL — ABNORMAL HIGH (ref 2.5–4.6)
Potassium: 4 mmol/L (ref 3.5–5.1)
Sodium: 140 mmol/L (ref 135–145)

## 2015-11-07 LAB — CBC
HCT: 24.7 % — ABNORMAL LOW (ref 40.0–52.0)
Hemoglobin: 8 g/dL — ABNORMAL LOW (ref 13.0–18.0)
MCH: 25.4 pg — ABNORMAL LOW (ref 26.0–34.0)
MCHC: 32.3 g/dL (ref 32.0–36.0)
MCV: 78.6 fL — ABNORMAL LOW (ref 80.0–100.0)
Platelets: 154 10*3/uL (ref 150–440)
RBC: 3.14 MIL/uL — ABNORMAL LOW (ref 4.40–5.90)
RDW: 18.7 % — ABNORMAL HIGH (ref 11.5–14.5)
WBC: 6.2 10*3/uL (ref 3.8–10.6)

## 2015-11-07 LAB — GLUCOSE, CAPILLARY
Glucose-Capillary: 100 mg/dL — ABNORMAL HIGH (ref 65–99)
Glucose-Capillary: 90 mg/dL (ref 65–99)
Glucose-Capillary: 137 mg/dL — ABNORMAL HIGH (ref 65–99)

## 2015-11-07 LAB — HEMOGLOBIN A1C: Hgb A1c MFr Bld: 7.1 % — ABNORMAL HIGH (ref 4.0–6.0)

## 2015-11-07 MED ORDER — INSULIN GLARGINE 100 UNIT/ML SOLOSTAR PEN: 10.0000 [IU] | PEN_INJECTOR | Freq: Every day | SUBCUTANEOUS | Status: DC

## 2015-11-07 NOTE — Discharge Summary (Signed)
[facility] [place] NAME: [name]    MR#:  [id number]  DATE OF BIRTH:  1961-08-26  DATE OF ADMISSION:  11/06/2015 ADMITTING PHYSICIAN: Aldean Jewett, MD  DATE OF DISCHARGE: 11/07/2015  PRIMARY CARE PHYSICIAN: Margarita Rana, MD    ADMISSION DIAGNOSIS:  Hypocalcemia [E83.51] Hypomagnesemia [E83.42] Renal failure [N19] Hypoglycemia [E16.2] Acute renal failure, unspecified acute renal failure type ([place]) [N17.9]  DISCHARGE DIAGNOSIS:  Active Problems:   Acute renal failure superimposed on stage 4 chronic kidney disease ([place])   SECONDARY DIAGNOSIS:   Past Medical History  Diagnosis Date  . Diabetes mellitus without complication ([place])   . Renal disorder   . Hypertension   . Crohn disease ([place])   . Hidradenitis suppurativa   . Anemia   . DVT of lower extremity (deep venous thrombosis) ([place]) 2016    HOSPITAL COURSE:   55 year old male with chronic kidney disease stage IV need for hemodialysis and type 2 diabetes who presented with hypoglycemia. Further details please refer the H&P.  1. Hypoglycemia: This is due to the fact the patient's kidney function is decreasing and he is continuing to take higher dose of insulin. His blood sugars have improved. Patient was seen by the diabetic coordinator. He will decrease Lantus to 10 units at bedtime. He will follow instructions as advised by the diabetes coordinator to monitor his blood sugars every before meals and daily at bedtime.  2. Chronic kidney disease stage IV/5: Patient is approaching dialysis. His creatinine baseline is approximate 6. His creatinine has improved with IV fluids. We also held Lasix. I spoke with [facility] nephrology on call. Patient has an appointment on Monday to see his regular nephrologist. Patient is asymptomatic and creatinine is decreasing. Renal ultrasound was normal. Electrolytes were within normal limits. Therefore patient will be discharged and have  follow-up as planned 8 AM on Monday morning with [facility] nephrology. He was told that if he should become symptomatic with shortness of breath, fatigue then he should come to the ER.  3. Anemia of chronic disease: This is due to renal disease. Patient will have follow-up on Monday for his anemia.  4. Essential hypertension: Patient will continue Norvasc and Coreg.  5. History of DVT: Patient is now off of anticoagulation and was treated for 6 months.  DISCHARGE CONDITIONS AND DIET:   Patient stable for discharge on a renal diet/diabetic diet  CONSULTS OBTAINED:  Treatment Team:  Anthonette Legato, MD  DRUG ALLERGIES:   Allergies  Allergen Reactions  . Vancomycin Shortness Of Breath    Eyes watering, SOB, wheezing  . Methotrexate Other (See Comments)    Blood count drops  . Tape     DISCHARGE MEDICATIONS:   Current Discharge Medication List    CONTINUE these medications which have CHANGED   Details  Insulin Glargine (LANTUS SOLOSTAR) 100 UNIT/ML Solostar Pen Inject 10 Units into the skin daily at 10 pm. Qty: 15 pen, Refills: 1   Associated Diagnoses: CKD stage 4 due to type 2 diabetes mellitus ([place])      CONTINUE these medications which have NOT CHANGED   Details  acetaminophen (TYLENOL) 500 MG tablet Take 500 mg by mouth every 6 (six) hours as needed.    amLODipine (NORVASC) 10 MG tablet TAKE ONE TABLET BY MOUTH ONCE DAILY Qty: 30 tablet, Refills: 0   Associated Diagnoses: Essential hypertension    calcitRIOL (ROCALTROL) 0.25 MCG capsule Take 0.5 mcg by mouth every other day. Refills:  1    carvedilol (COREG) 12.5 MG tablet Take 12.5 mg by mouth 2 (two) times daily. Refills: 3    pravastatin (PRAVACHOL) 40 MG tablet Take 40 mg by mouth daily. Refills: 2      STOP taking these medications     furosemide (LASIX) 40 MG tablet               Today   CHIEF COMPLAINT:  Patient is doing well this morning. Patient reports no chest pain, shortness of breath or  fatigue.  VITAL SIGNS:  Blood pressure 171/79, pulse 72, temperature 98.7 F (37.1 C), temperature source Oral, resp. rate 18, height 5' 10"  (1.778 m), weight 108.092 kg (238 lb 4.8 oz), SpO2 99 %.   REVIEW OF SYSTEMS:  Review of Systems  Constitutional: Negative for fever, chills and malaise/fatigue.  HENT: Negative for sore throat.   Eyes: Negative for blurred vision.  Respiratory: Negative for cough, hemoptysis, shortness of breath and wheezing.   Cardiovascular: Negative for chest pain, palpitations and leg swelling.  Gastrointestinal: Negative for nausea, vomiting, abdominal pain, diarrhea and blood in stool.  Genitourinary: Negative for dysuria.  Musculoskeletal: Negative for back pain.  Neurological: Negative for dizziness, tremors and headaches.  Endo/Heme/Allergies: Does not bruise/bleed easily.     PHYSICAL EXAMINATION:  GENERAL:  55 y.o.-year-old patient lying in the bed with no acute distress.  NECK:  Supple, no jugular venous distention. No thyroid enlargement, no tenderness.  LUNGS: Normal breath sounds bilaterally, no wheezing, rales,rhonchi  No use of accessory muscles of respiration.  CARDIOVASCULAR: S1, S2 normal. No murmurs, rubs, or gallops.  ABDOMEN: Soft, non-tender, non-distended. Bowel sounds present. No organomegaly or mass.  EXTREMITIES: No pedal edema, cyanosis, or clubbing.  PSYCHIATRIC: The patient is alert and oriented x 3.  SKIN: No obvious rash, lesion, or ulcer.   DATA REVIEW:   CBC  Recent Labs Lab 11/07/15 0446  WBC 6.2  HGB 8.0*  HCT 24.7*  PLT 154    Chemistries   Recent Labs Lab 11/06/15 1433 11/07/15 0406  NA 140 140  K 4.1 4.0  CL 106 108  CO2 22 21*  GLUCOSE 42* 73  BUN 67* 64*  CREATININE 6.93* 6.58*  CALCIUM 6.4* 6.5*  MG 1.5*  --   AST 17  --   ALT 12*  --   ALKPHOS 74  --   BILITOT 0.4  --     Cardiac Enzymes No results for input(s): TROPONINI in the last 168 hours.  Microbiology Results   @[email or web address] @  RADIOLOGY:  Dg Chest 2 View  11/06/2015  CLINICAL DATA:  Pt had an episode at work today, his sugar dropped, syncope episode, edema in left leg EXAM: CHEST  2 VIEW COMPARISON:  02/26/2015 FINDINGS: The heart is enlarged. There are no focal consolidations or pleural effusions. No pulmonary edema. Mildly prominent interstitial markings appear stable. IMPRESSION: Cardiomegaly. Electronically Signed   By: Nolon Nations M.D.   On: 11/06/2015 16:54   Ct Head Wo Contrast  11/06/2015  CLINICAL DATA:  55 year old male with history of headaches and confusion. Low blood sugar. EXAM: CT HEAD WITHOUT CONTRAST TECHNIQUE: Contiguous axial images were obtained from the base of the skull through the vertex without intravenous contrast. COMPARISON:  No priors. FINDINGS: Patchy areas of decreased attenuation are noted throughout the deep and periventricular white matter of the cerebral hemispheres bilaterally, compatible with mild chronic microvascular ischemic disease. No acute intracranial abnormalities. Specifically, no evidence of acute intracranial hemorrhage, no definite  findings of acute/subacute cerebral ischemia, no mass, mass effect, hydrocephalus or abnormal intra or extra-axial fluid collections. Visualized paranasal sinuses and mastoids are well pneumatized. No acute displaced skull fractures are identified. IMPRESSION: 1. No acute intracranial abnormalities. 2. Mild chronic microvascular ischemic changes in the cerebral white matter, as above. Electronically Signed   By: Vinnie Langton M.D.   On: 11/06/2015 16:56   US Renal  11/07/2015  CLINICAL DATA:  Renal failure EXAM: RENAL / URINARY TRACT ULTRASOUND COMPLETE COMPARISON:  None. FINDINGS: Right Kidney: Length: 13.5 cm. Mildly increased echotexture. No mass or hydronephrosis. Left Kidney: Length: 12.6 cm. Mildly increased echotexture. No mass or hydronephrosis. Bladder: Appears normal for degree of bladder distention. Mildly prominent  prostate. IMPRESSION: Mildly increased echotexture in the kidneys bilaterally suggesting chronic medical renal disease. No hydronephrosis. Electronically Signed   By: Rolm Baptise M.D.   On: 11/07/2015 10:25   US Venous Img Lower Unilateral Left  11/06/2015  CLINICAL DATA:  55 year old male with intermittent left lower extremity swelling for the past year EXAM: LEFT LOWER EXTREMITY VENOUS DOPPLER ULTRASOUND TECHNIQUE: Gray-scale sonography with graded compression, as well as color Doppler and duplex ultrasound were performed to evaluate the lower extremity deep venous systems from the level of the common femoral vein and including the common femoral, femoral, profunda femoral, popliteal and calf veins including the posterior tibial, peroneal and gastrocnemius veins when visible. The superficial great saphenous vein was also interrogated. Spectral Doppler was utilized to evaluate flow at rest and with distal augmentation maneuvers in the common femoral, femoral and popliteal veins. COMPARISON:  Prior duplex ultrasound 07/24/2014 FINDINGS: Contralateral Common Femoral Vein: Respiratory phasicity is normal and symmetric with the symptomatic side. No evidence of thrombus. Normal compressibility. Common Femoral Vein: No evidence of thrombus. Normal compressibility, respiratory phasicity and response to augmentation. Saphenofemoral Junction: No evidence of thrombus. Normal compressibility and flow on color Doppler imaging. Profunda Femoral Vein: No evidence of thrombus. Normal compressibility and flow on color Doppler imaging. Femoral Vein: No evidence of thrombus. Normal compressibility, respiratory phasicity and response to augmentation. Popliteal Vein: No evidence of thrombus. Normal compressibility, respiratory phasicity and response to augmentation. Calf Veins: No evidence of thrombus. Normal compressibility and flow on color Doppler imaging. Superficial Great Saphenous Vein: No evidence of thrombus. Normal  compressibility and flow on color Doppler imaging. Venous Reflux:  None. Other Findings: Mildly prominent superficial inguinal lymph nodes in the left groin. These nodes were present on the prior ultrasound from October of 2015. There is been no significant interval change. IMPRESSION: No evidence of deep venous thrombosis. Electronically Signed   By: Jacqulynn Cadet M.D.   On: 11/06/2015 17:28      Management plans discussed with the patient and he is in agreement. Stable for discharge   Patient should follow up with [facility] nephrology on Monday  CODE STATUS:     Code Status Orders        Start     Ordered   11/06/15 2036  Full code   Continuous     11/06/15 2035    Code Status History    Date Active Date Inactive Code Status Order ID Comments User Context   This patient has a current code status but no historical code status.      TOTAL TIME TAKING CARE OF THIS PATIENT: 35 minutes.    Note: This dictation was prepared with Dragon dictation along with smaller phrase technology. Any transcriptional errors that result from this process are unintentional.  Jaquin Coy M.D on 11/07/2015 at 11:30 AM  Between 7am to 6pm - Pager - [phone] After 6pm go to [email or web address] - password [email or web address]  [place] [facility]  Office  [phone]  CC: Primary care physician; Margarita Rana, MD

## 2015-11-07 NOTE — Progress Notes (Signed)
Inpatient Diabetes Program Recommendations  AACE/ADA: New Consensus Statement on Inpatient Glycemic Control (2015)  Target Ranges:  Prepandial:   less than 140 mg/dL      Peak postprandial:   less than 180 mg/dL (1-2 hours)      Critically ill patients:  140 - 180 mg/dL   Review of Glycemic Control  Results for EYTHAN, BEBEAU (MRN [id number]) as of 11/07/2015 09:19  Ref. Range 11/06/2015 15:16 11/06/2015 16:53 11/06/2015 18:01 11/06/2015 18:31 11/07/2015 08:01  Glucose-Capillary Latest Ref Range: 65-99 mg/dL 72 119 (H) 143 (H) 143 (H) 90    Diabetes history: Type 2 Outpatient Diabetes medications: Ordered- Lantus 45 units qhs, Humalog 15 units tid      Takes-Lantus 20-25 units qhs, Humalog 15 units tid Current orders for Inpatient glycemic control: Novolog correction 0-9 units tid, Novolog 0-5 units qhs  Inpatient Diabetes Program Recommendations: Spoke with patient- he Does Not take Humalog insulin as ordered.   Please consider ordering Lantus 10 units qhs and Novolog correction 0-9 units tid (will likely require a consistent Novolog mealtime insulin but will continue to follow and make recommendations)  Gentry Fitz, RN, BA, MHA, CDE Diabetes Coordinator Inpatient Diabetes Program  [phone] (Team Pager) [phone] ([place]) 11/07/2015 9:24 AM

## 2015-11-07 NOTE — Care Management (Signed)
I have notified [facility] dialysis liaison of patient admission.

## 2015-11-07 NOTE — Progress Notes (Signed)
Brief Nutrition Note:  Dietitian Consult for Diet Education received. Pt discharged from [facility]. Education materials mailed to address provided in [facility] on 11/07/2015.  Dwyane Luo, RD, LDN Pager [phone] Weekend/On-Call Pager [phone]

## 2016-02-04 DIAGNOSIS — M009 Pyogenic arthritis, unspecified: Secondary | ICD-10-CM | POA: Insufficient documentation

## 2016-02-04 HISTORY — DX: Pyogenic arthritis, unspecified: M00.9

## 2016-02-04 HISTORY — PX: KNEE SURGERY: SHX244

## 2016-02-10 ENCOUNTER — Telehealth: Payer: Self-pay | Admitting: Family Medicine

## 2016-02-10 NOTE — Telephone Encounter (Signed)
Talked with Dr. Nancie Neas, patient being discharged from [facility] on IV antibiotics.  Will sign paper work as needed.  Thanks.

## 2016-02-20 ENCOUNTER — Encounter: Payer: Self-pay | Admitting: Family Medicine

## 2016-02-20 ENCOUNTER — Ambulatory Visit (INDEPENDENT_AMBULATORY_CARE_PROVIDER_SITE_OTHER): Payer: BLUE CROSS/BLUE SHIELD | Admitting: Family Medicine

## 2016-02-20 VITALS — BP 146/76 | HR 64 | Temp 98.3°F | Resp 16 | Wt 232.0 lb

## 2016-02-20 DIAGNOSIS — E7800 Pure hypercholesterolemia, unspecified: Secondary | ICD-10-CM

## 2016-02-20 DIAGNOSIS — Z09 Encounter for follow-up examination after completed treatment for conditions other than malignant neoplasm: Secondary | ICD-10-CM

## 2016-02-20 DIAGNOSIS — R079 Chest pain, unspecified: Secondary | ICD-10-CM | POA: Insufficient documentation

## 2016-02-20 DIAGNOSIS — E1129 Type 2 diabetes mellitus with other diabetic kidney complication: Secondary | ICD-10-CM

## 2016-02-20 DIAGNOSIS — K120 Recurrent oral aphthae: Secondary | ICD-10-CM | POA: Insufficient documentation

## 2016-02-20 DIAGNOSIS — M009 Pyogenic arthritis, unspecified: Secondary | ICD-10-CM | POA: Diagnosis not present

## 2016-02-20 DIAGNOSIS — L732 Hidradenitis suppurativa: Secondary | ICD-10-CM | POA: Insufficient documentation

## 2016-02-20 DIAGNOSIS — M79606 Pain in leg, unspecified: Secondary | ICD-10-CM | POA: Insufficient documentation

## 2016-02-20 DIAGNOSIS — G629 Polyneuropathy, unspecified: Secondary | ICD-10-CM | POA: Insufficient documentation

## 2016-02-20 NOTE — Progress Notes (Signed)
Patient ID: [name], male   DOB: 03-14-1961, 55 y.o.   MRN: [id number]       Patient: [name] Male    DOB: 1961-10-16   55 y.o.   MRN: [id number] Visit Date: 02/20/2016  Today's Provider: Margarita Rana, MD   Chief Complaint  Patient presents with  . Hospitalization Follow-up   Subjective:    HPI  Follow up Hospitalization  Patient was admitted to [facility] on 02/03/2016 and discharged on 02/10/2016. He was treated for knee surgery. Treatment for this included. He reports excellent compliance with treatment. He reports this condition is Improved.   Reviewed hospital records and treatment plan.    ------------------------------------------------------------------------------------     Diabetes Mellitus Type II, Follow-up:   Lab Results  Component Value Date   HGBA1C 7.1* 11/06/2015   HGBA1C 8.3* 07/24/2014    Last seen for diabetes over 1 year ago.  Management since then includes no changes. He reports excellent compliance with treatment. He is not having side effects. Current symptoms include none and have been stable. Home blood sugar records: fasting 85-135  Episodes of hypoglycemia? Yes    Current Insulin Regimen: Novolog and Lantus Most Recent Eye Exam: no to date. Weight trend: stable Prior visit with dietician: no Current diet: in general, a "healthy" diet   Current exercise: none  Pertinent Labs:    Component Value Date/Time   CREATININE 6.58* 11/07/2015 0406   CREATININE 4.95* 07/24/2014 1524    Wt Readings from Last 3 Encounters:  02/20/16 232 lb (105.235 kg)  11/06/15 238 lb 4.8 oz (108.092 kg)    Also, has been taking his cholesterol medication without any difficulty.   Has not had labs checked in quite awhile.    ------------------------------------------------------------------------   Allergies  Allergen Reactions  . Vancomycin Shortness Of Breath    Eyes watering, SOB, wheezing  . Methotrexate Other (See Comments)   Blood count drops  . Tape    Previous Medications   ACETAMINOPHEN (TYLENOL) 500 MG TABLET    Take 500 mg by mouth every 6 (six) hours as needed.   AMLODIPINE (NORVASC) 10 MG TABLET    TAKE ONE TABLET BY MOUTH ONCE DAILY   CALCITRIOL (ROCALTROL) 0.25 MCG CAPSULE    Take 0.5 mcg by mouth every other day.   CARVEDILOL (COREG) 12.5 MG TABLET    Take 12.5 mg by mouth 2 (two) times daily.   FERROUS SULFATE 325 (65 FE) MG TABLET    Take 325 mg by mouth 2 (two) times daily with a meal.   FOLIC ACID (FOLVITE) 1 MG TABLET    Take 1 mg by mouth daily.   INSULIN ASPART (NOVOLOG) 100 UNIT/ML INJECTION    Inject 10 Units into the skin 3 (three) times daily before meals.   INSULIN GLARGINE (LANTUS SOLOSTAR) 100 UNIT/ML SOLOSTAR PEN    Inject 10 Units into the skin daily at 10 pm.   PRAVASTATIN (PRAVACHOL) 40 MG TABLET    Take 40 mg by mouth daily.   SODIUM BICARBONATE 650 MG TABLET    Take 650 mg by mouth 2 (two) times daily.    Review of Systems  Constitutional: Negative.   Endocrine: Negative.   Musculoskeletal: Positive for arthralgias.    Social History  Substance Use Topics  . Smoking status: Never Smoker   . Smokeless tobacco: Never Used  . Alcohol Use: No   Objective:   BP 146/76 mmHg  Pulse 64  Temp(Src) 98.3 F (36.8 C) (Oral)  Resp 16  Wt 232 lb (105.235 kg)  SpO2 100%  Physical Exam  Constitutional: He is oriented to person, place, and time. He appears well-developed and well-nourished.  Musculoskeletal:  Walking with cane. Left knee in brace.   Neurological: He is alert and oriented to person, place, and time.       Assessment & Plan:     1. Hospital discharge follow-up Stable. Follow up with ID as below.   2. Pyogenic arthritis of right knee joint, due to unspecified organism ([place]) Stable. Patient advised to continue amoxicillin 500 as prescribed by Dr. Uvaldo Rising ID from [facility].  3. Type 2 diabetes mellitus with other diabetic kidney complication (H[facility]) Stable. Patient  advised to continue current medication and plan of care. Will check labs.  - Hemoglobin A1c  4. Hypercholesteremia Condition is stable. Please continue current medication and  plan of care as noted.  Will check labs.   - Lipid Panel With LDL/HDL Ratio      Patient seen and examined by Dr. Jerrell Belfast, and note scribed by Philbert Riser. [place], CMA.  I have reviewed the document for accuracy and completeness and I agree with above. - Jerrell Belfast, MD   Margarita Rana, MD  [facility]

## 2016-02-21 LAB — LIPID PANEL WITH LDL/HDL RATIO
Cholesterol, Total: 167 mg/dL (ref 100–199)
HDL: 43 mg/dL (ref >39)
LDL Calculated: 97 mg/dL (ref 0–99)
LDl/HDL Ratio: 2.3 ratio units (ref 0.0–3.6)
Triglycerides: 136 mg/dL (ref 0–149)
VLDL Cholesterol Cal: 27 mg/dL (ref 5–40)

## 2016-02-21 LAB — HEMOGLOBIN A1C
Est. average glucose Bld gHb Est-mCnc: 177 mg/dL
Hgb A1c MFr Bld: 7.8 % — ABNORMAL HIGH (ref 4.8–5.6)

## 2016-02-23 ENCOUNTER — Telehealth: Payer: Self-pay

## 2016-02-23 NOTE — Telephone Encounter (Signed)
-----   Message from Margarita Rana, MD sent at 02/21/2016  8:05 AM EDT ----- Cholesterol looks good.   HgbA1c is  7.8.  Not as good as previous. May be related to infection.  Please continue to adjust insulin and recheck labs in 3 months with Dr. Caryn Section. Thanks.

## 2016-02-23 NOTE — Telephone Encounter (Signed)
Left message to call back  

## 2016-02-25 NOTE — Telephone Encounter (Signed)
Advised pt of lab results. Pt verbally acknowledges understanding. Raif Chachere Drozdowski, CMA   

## 2016-03-16 ENCOUNTER — Other Ambulatory Visit: Payer: Self-pay | Admitting: Family Medicine

## 2016-03-16 DIAGNOSIS — N529 Male erectile dysfunction, unspecified: Secondary | ICD-10-CM

## 2016-03-30 ENCOUNTER — Telehealth: Payer: Self-pay | Admitting: Family Medicine

## 2016-03-30 NOTE — Telephone Encounter (Signed)
Pt called stated that he was advised by [facility] that he needed to call and schedule an appt to get a TB skin test. I advised that we don't do TB skin test in the office, we order the blood test for patients to have done at the lab. I advised pt that a message has been sent to Dr. Venia Minks. Please advise Thanks TNP

## 2016-03-30 NOTE — Telephone Encounter (Signed)
Hassan Rowan that we do not perform PPD skin tests, and would order the Quantiferon Gold. Reports their office uses [facility] as well, and may be able to order the lab themselves. Lovey Newcomer is going to report this to Dr. Johnney Ou, and call back if needed. Renaldo Fiddler, CMA

## 2016-03-30 NOTE — Telephone Encounter (Signed)
Please review-aa 

## 2016-03-30 NOTE — Telephone Encounter (Signed)
Lovey Newcomer [facility] ) calling from Dr. Johnney Ou office  to see if Dr. Venia Minks would do a PPD Placement tomorrow morning on pt. Please call [facility] @ [phone]. Thanks CC

## 2016-03-30 NOTE — Telephone Encounter (Signed)
Ok to order. Takes a few days. Needs to get soon. Thanks.

## 2016-03-30 NOTE — Telephone Encounter (Signed)
Sandy calling back to check on status of her message from earlier. Lovey Newcomer is wanting a return call by this afternoon if possible.  Thanks CC

## 2016-04-12 DIAGNOSIS — Z992 Dependence on renal dialysis: Secondary | ICD-10-CM

## 2016-04-12 DIAGNOSIS — N186 End stage renal disease: Secondary | ICD-10-CM | POA: Insufficient documentation

## 2016-04-12 DIAGNOSIS — L03116 Cellulitis of left lower limb: Secondary | ICD-10-CM | POA: Insufficient documentation

## 2016-04-20 ENCOUNTER — Other Ambulatory Visit: Payer: Self-pay | Admitting: Family Medicine

## 2016-04-20 DIAGNOSIS — E1129 Type 2 diabetes mellitus with other diabetic kidney complication: Secondary | ICD-10-CM

## 2016-04-22 NOTE — Telephone Encounter (Signed)
LOV 02/20/2016. Has appt 04/27/2016. Renaldo Fiddler, CMA

## 2016-04-27 ENCOUNTER — Ambulatory Visit (INDEPENDENT_AMBULATORY_CARE_PROVIDER_SITE_OTHER): Payer: BLUE CROSS/BLUE SHIELD | Admitting: Family Medicine

## 2016-04-27 ENCOUNTER — Encounter: Payer: Self-pay | Admitting: Family Medicine

## 2016-04-27 VITALS — BP 116/64 | HR 68 | Temp 98.4°F | Resp 16 | Wt 213.0 lb

## 2016-04-27 DIAGNOSIS — N529 Male erectile dysfunction, unspecified: Secondary | ICD-10-CM

## 2016-04-27 DIAGNOSIS — L03116 Cellulitis of left lower limb: Secondary | ICD-10-CM

## 2016-04-27 MED ORDER — SILDENAFIL CITRATE 100 MG PO TABS: 100.0000 mg | ORAL_TABLET | Freq: Every day | ORAL | Status: DC | PRN

## 2016-04-27 NOTE — Progress Notes (Signed)
Patient: [name] Male    DOB: 09/17/1961   55 y.o.   MRN: [id number] Visit Date: 04/27/2016  Today's Provider: [name], MD   Chief Complaint  Patient presents with  . Hospitalization Follow-up  . Cellulitis    Left leg   Subjective:    HPI    Follow up Hospitalization  Patient was admitted to [facility] on 04/12/2016 and discharged on 06/272017. He was treated for Cellulitis on his left leg. Treatment for this included IV linezolid and was discharged on Clindamycin. He was also given PRBC. PVL was negative for DVT He states he has been to dialysis since discharge and states has had labs checked.  He reports excellent compliance with treatment. He reports this condition is Improved.  ------------------------------------------------------------------------------------     Allergies  Allergen Reactions  . Vancomycin Shortness Of Breath    Eyes watering, SOB, wheezing  . Methotrexate Other (See Comments)    Blood count drops  . Tape    Current Meds  Medication Sig  . acetaminophen (TYLENOL) 500 MG tablet Take 500 mg by mouth every 6 (six) hours as needed.  Marland Kitchen amLODipine (NORVASC) 10 MG tablet TAKE ONE TABLET BY MOUTH ONCE DAILY  . calcitRIOL (ROCALTROL) 0.25 MCG capsule Take 0.5 mcg by mouth 2 (two) times daily.   . carvedilol (COREG) 12.5 MG tablet Take 12.5 mg by mouth 2 (two) times daily.  . clindamycin (CLEOCIN) 150 MG capsule   . ferrous sulfate 325 (65 FE) MG tablet Take 325 mg by mouth 2 (two) times daily with a meal.  . folic acid (FOLVITE) 1 MG tablet Take 1 mg by mouth daily.  Marland Kitchen inFLIXimab (REMICADE) 100 MG injection Inject into the vein every 6 (six) weeks.   . ONE TOUCH ULTRA TEST test strip USE TO CHECK BLOOD SUGAR THREE TIMES DAILY  . pravastatin (PRAVACHOL) 40 MG tablet Take 40 mg by mouth daily.  . sodium bicarbonate 650 MG tablet Take 650 mg by mouth 2 (two) times daily.  Marland Kitchen VIAGRA 100 MG tablet TAKE ONE TABLET BY MOUTH EVERY DAY AS  NEEDED    Review of Systems  Constitutional: Negative.   Respiratory: Negative.   Cardiovascular: Positive for leg swelling (Left leg swelling; secondary to cellulitis. Pt reports it has improved greatly. ). Negative for chest pain and palpitations.  Gastrointestinal: Positive for nausea (Pt reports having occasional nausea secondary to the antibiotic. ). Negative for vomiting, abdominal pain, diarrhea, constipation, blood in stool, abdominal distention, anal bleeding and rectal pain.  Neurological: Negative for dizziness, light-headedness and headaches.    Social History  Substance Use Topics  . Smoking status: Never Smoker   . Smokeless tobacco: Never Used  . Alcohol Use: No   Objective:   BP 116/64 mmHg  Pulse 68  Temp(Src) 98.4 F (36.9 C) (Oral)  Resp 16  Wt 213 lb (96.616 kg)  Physical Exam   General Appearance:    Alert, cooperative, no distress  Eyes:    PERRL, conjunctiva/corneas clear, EOM's intact       Lungs:     Clear to auscultation bilaterally, respirations unlabored  Heart:    Regular rate and rhythm  Neurologic:   Awake, alert, oriented x 3. No apparent focal neurological           defect.   Extremities:   2+ edema of left lower leg from ankle to proximal calf. No erythema. Not tender. Not unusually warm to touch.  Assessment & Plan:     1. Cellulitis of left lower extremity Greatly improved. Has been recurrent problem likely due to ESRD. He is to finish up clindamycin and follow up in 2 weeks to make sure no recurrence. Will also be due for A1c.   2. Erectile dysfunction, unspecified erectile dysfunction type Requests refill for Viagra.  - sildenafil (VIAGRA) 100 MG tablet; Take 1 tablet (100 mg total) by mouth daily as needed.  Dispense: 6 tablet; Refill: 1       [name], MD  [facility]

## 2016-05-11 ENCOUNTER — Encounter: Payer: Self-pay | Admitting: Family Medicine

## 2016-05-11 ENCOUNTER — Ambulatory Visit (INDEPENDENT_AMBULATORY_CARE_PROVIDER_SITE_OTHER): Payer: BLUE CROSS/BLUE SHIELD | Admitting: Family Medicine

## 2016-05-11 VITALS — BP 100/60 | HR 72 | Temp 98.7°F | Resp 16 | Ht 70.0 in | Wt 214.0 lb

## 2016-05-11 DIAGNOSIS — E1165 Type 2 diabetes mellitus with hyperglycemia: Secondary | ICD-10-CM | POA: Diagnosis not present

## 2016-05-11 DIAGNOSIS — L03116 Cellulitis of left lower limb: Secondary | ICD-10-CM

## 2016-05-11 DIAGNOSIS — E1122 Type 2 diabetes mellitus with diabetic chronic kidney disease: Secondary | ICD-10-CM

## 2016-05-11 DIAGNOSIS — Z794 Long term (current) use of insulin: Secondary | ICD-10-CM

## 2016-05-11 DIAGNOSIS — N186 End stage renal disease: Secondary | ICD-10-CM

## 2016-05-11 DIAGNOSIS — Z992 Dependence on renal dialysis: Secondary | ICD-10-CM

## 2016-05-11 DIAGNOSIS — E1129 Type 2 diabetes mellitus with other diabetic kidney complication: Secondary | ICD-10-CM

## 2016-05-11 LAB — POCT GLYCOSYLATED HEMOGLOBIN (HGB A1C): Hemoglobin A1C: 8.7

## 2016-05-11 MED ORDER — INSULIN GLARGINE 100 UNIT/ML SOLOSTAR PEN: 24.0000 [IU] | PEN_INJECTOR | Freq: Every day | SUBCUTANEOUS | 3 refills | Status: DC

## 2016-05-11 NOTE — Progress Notes (Signed)
Patient: [name] Male    DOB: [birth year]   55 y.o.   MRN: [id number] Visit Date: 05/11/2016  Today's Provider: [name], MD   Chief Complaint  Patient presents with  . Follow-up  . Diabetes  . Cellulitis   Subjective:    HPI   Cellulitis of left lower extremity Greatly improved. Has been recurrent problem likely due to ESRD. He is to finish up clindamycin and follow up in 2 weeks to make sure no recurrence. Symptoms have improved.    Diabetes Mellitus Type II, Follow-up:   Lab Results  Component Value Date   HGBA1C 7.8 (H) 02/20/2016   HGBA1C 7.1 (H) 11/06/2015   HGBA1C 8.3 (H) 07/24/2014   Last seen for diabetes 2 months ago.  Management since then includes; no changes. He reports good compliance with treatment. He is not having side effects. none Current symptoms include none and have been unchanged. Home blood sugar records: fasting range: 200's  Episodes of hypoglycemia? no   Current Insulin Regimen: 20 units Lantus at bedtime and 5-8 units before meals.  Most Recent Eye Exam: due Weight trend: stable Prior visit with dietician: no Current diet: in general, an "unhealthy" diet Current exercise: none  ----------------------------------------------------------------    Allergies  Allergen Reactions  . Vancomycin Shortness Of Breath    Eyes watering, SOB, wheezing  . Methotrexate Other (See Comments)    Blood count drops  . Tape    Current Meds  Medication Sig  . acetaminophen (TYLENOL) 500 MG tablet Take 500 mg by mouth every 6 (six) hours as needed.  Marland Kitchen amLODipine (NORVASC) 10 MG tablet TAKE ONE TABLET BY MOUTH ONCE DAILY  . calcitRIOL (ROCALTROL) 0.25 MCG capsule Take 0.5 mcg by mouth 2 (two) times daily.   . carvedilol (COREG) 12.5 MG tablet Take 12.5 mg by mouth 2 (two) times daily.  . ferrous sulfate 325 (65 FE) MG tablet Take 325 mg by mouth 2 (two) times daily with a meal.  . folic acid (FOLVITE) 1 MG tablet Take 1 mg by  mouth daily.  Marland Kitchen inFLIXimab (REMICADE) 100 MG injection Inject into the vein every 6 (six) weeks.   Marland Kitchen LANTUS SOLOSTAR 100 UNIT/ML Solostar Pen INJECT 45 UNITS SUBCUTANEOUSLY ONCE DAILY. INCREASE BY 1 UNIT A DAY UNTIL SUGARS ARE IMPROVED (Patient taking differently: Inject 15-20 units at bedtime.)  . NOVOLOG FLEXPEN 100 UNIT/ML FlexPen INJECT 15 UNITS SUBCUTANEOUSLY THREE TIMES DAILY (Patient taking differently: Inject 5-6 Units before a meal.)  . ONE TOUCH ULTRA TEST test strip USE TO CHECK BLOOD SUGAR THREE TIMES DAILY  . pravastatin (PRAVACHOL) 40 MG tablet Take 40 mg by mouth daily.  . sildenafil (VIAGRA) 100 MG tablet Take 1 tablet (100 mg total) by mouth daily as needed.  . sodium bicarbonate 650 MG tablet Take 650 mg by mouth 2 (two) times daily.    Review of Systems  Constitutional: Negative for appetite change, chills and fever.  Respiratory: Negative for chest tightness, shortness of breath and wheezing.   Cardiovascular: Negative for chest pain and palpitations.  Gastrointestinal: Negative for abdominal pain, nausea and vomiting.    Social History  Substance Use Topics  . Smoking status: Never Smoker  . Smokeless tobacco: Never Used  . Alcohol use No   Objective:   BP 100/60 (BP Location: Left Arm, Patient Position: Sitting, Cuff Size: Large)   Pulse 72   Temp 98.7 F (37.1 C) (Oral)   Resp 16  Ht 5\' 10"  (1.778 m)   Wt 214 lb (97.1 kg)   SpO2 98%   BMI 30.71 kg/m   Physical Exam   General Appearance:    Alert, cooperative, no distress  Eyes:    PERRL, conjunctiva/corneas clear, EOM's intact       Lungs:     Clear to auscultation bilaterally, respirations unlabored  Heart:    Regular rate and rhythm  Neurologic:   Awake, alert, oriented x 3. No apparent focal neurological           defect.   Ext:  2+ edema left LE, tr on right. No redness, tenderness or erythema.    A1c=8.7      Assessment & Plan:     1. Uncontrolled type 2 diabetes mellitus with chronic  kidney disease, with long-term current use of insulin, unspecified CKD stage (HCC) Increase lantus from 20 to 24 units daily Follow up to check A1c in 3 months.   - Insulin Glargine (LANTUS SOLOSTAR) 100 UNIT/ML Solostar Pen; Inject 24 Units into the skin daily at 10 pm.  Dispense: 1 pen; Refill: 3  2. Cellulitis of left lower extremity Resolved. Edema greatly improved since starting dialysis by patient report. Call if any other symptoms return.   3. End stage renal failure on dialysis [facility]) Doing better since starting dialysis        [name], MD  [place]

## 2016-06-23 ENCOUNTER — Ambulatory Visit: Payer: BLUE CROSS/BLUE SHIELD | Admitting: Family Medicine

## 2016-08-12 ENCOUNTER — Ambulatory Visit: Payer: BLUE CROSS/BLUE SHIELD | Admitting: Family Medicine

## 2016-08-18 ENCOUNTER — Ambulatory Visit (INDEPENDENT_AMBULATORY_CARE_PROVIDER_SITE_OTHER): Payer: Self-pay | Admitting: Family Medicine

## 2016-08-18 ENCOUNTER — Encounter: Payer: Self-pay | Admitting: Family Medicine

## 2016-08-18 VITALS — BP 138/70 | HR 59 | Temp 98.2°F | Resp 16 | Ht 70.0 in | Wt 227.0 lb

## 2016-08-18 DIAGNOSIS — E1129 Type 2 diabetes mellitus with other diabetic kidney complication: Secondary | ICD-10-CM

## 2016-08-18 DIAGNOSIS — Z794 Long term (current) use of insulin: Secondary | ICD-10-CM

## 2016-08-18 LAB — POCT GLYCOSYLATED HEMOGLOBIN (HGB A1C)
Est. average glucose Bld gHb Est-mCnc: 272
Hemoglobin A1C: 11.1

## 2016-08-18 MED ORDER — INSULIN GLARGINE 100 UNIT/ML SOLOSTAR PEN: 30.0000 [IU] | PEN_INJECTOR | Freq: Every day | SUBCUTANEOUS | 3 refills | Status: DC

## 2016-08-18 MED ORDER — INSULIN ASPART 100 UNIT/ML FLEXPEN: PEN_INJECTOR | SUBCUTANEOUS | 3 refills | Status: DC

## 2016-08-18 NOTE — Patient Instructions (Signed)
Please contact your eyecare professional to schedule a routine eye exam  Increase Lantus insulin to 30 units daily

## 2016-08-18 NOTE — Progress Notes (Signed)
Patient: [name] Male    DOB: 1960/10/31   55 y.o.   MRN: [id number] Visit Date: 08/18/2016  Today's Provider: [name], MD   Chief Complaint  Patient presents with  . Follow-up  . Diabetes   Subjective:    HPI  End stage renal failure on dialysis [facility]) Is now doing home dialysis and generally feeling better.    Diabetes Mellitus Type II, Follow-up:   Lab Results  Component Value Date   HGBA1C 8.7 05/11/2016   HGBA1C 7.8 (H) 02/20/2016   HGBA1C 7.1 (H) 11/06/2015   Last seen for diabetes 3 months ago.  Management since then includes; increased Lantus from 20 to 24 units qd. He reports good compliance with treatment. He is not having side effects. none Current symptoms include none and have been unchanged. Home blood sugar records: fasting range: 174-200  Episodes of hypoglycemia? no   Current Insulin Regimen: 24 units of lantus qd and 8-15 units Novolog by SS before meals. Most Recent Eye Exam: due Weight trend: stable Prior visit with dietician: no Current diet: in general, an "unhealthy" diet Current exercise: none  ----------------------------------------------------------------    Allergies  Allergen Reactions  . Vancomycin Shortness Of Breath    Eyes watering, SOB, wheezing  . Methotrexate Other (See Comments)    Blood count drops  . Tape      Current Outpatient Prescriptions:  .  acetaminophen (TYLENOL) 500 MG tablet, Take 500 mg by mouth every 6 (six) hours as needed., Disp: , Rfl:  .  amLODipine (NORVASC) 10 MG tablet, TAKE ONE TABLET BY MOUTH ONCE DAILY, Disp: 30 tablet, Rfl: 0 .  calcitRIOL (ROCALTROL) 0.25 MCG capsule, Take 0.5 mcg by mouth 2 (two) times daily. , Disp: , Rfl: 1 .  carvedilol (COREG) 12.5 MG tablet, Take 12.5 mg by mouth 2 (two) times daily., Disp: , Rfl: 3 .  ferrous sulfate 325 (65 FE) MG tablet, Take 325 mg by mouth 2 (two) times daily with a meal., Disp: , Rfl:  .  folic acid (FOLVITE) 1 MG tablet, Take  1 mg by mouth daily., Disp: , Rfl:  .  inFLIXimab (REMICADE) 100 MG injection, Inject into the vein every 6 (six) weeks. , Disp: , Rfl:  .  Insulin Glargine (LANTUS SOLOSTAR) 100 UNIT/ML Solostar Pen, Inject 24 Units into the skin daily at 10 pm., Disp: 1 pen, Rfl: 3 .  NOVOLOG FLEXPEN 100 UNIT/ML FlexPen, INJECT 15 UNITS SUBCUTANEOUSLY THREE TIMES DAILY (Patient taking differently: Inject 5-6 Units before a meal.), Disp: 1 pen, Rfl: 3 .  ONE TOUCH ULTRA TEST test strip, USE TO CHECK BLOOD SUGAR THREE TIMES DAILY, Disp: 100 each, Rfl: 3 .  pravastatin (PRAVACHOL) 40 MG tablet, Take 40 mg by mouth daily., Disp: , Rfl: 2 .  sildenafil (VIAGRA) 100 MG tablet, Take 1 tablet (100 mg total) by mouth daily as needed., Disp: 6 tablet, Rfl: 1 .  sodium bicarbonate 650 MG tablet, Take 650 mg by mouth 2 (two) times daily., Disp: , Rfl:   Review of Systems  Constitutional: Negative for appetite change, chills and fever.  Respiratory: Negative for chest tightness, shortness of breath and wheezing.   Cardiovascular: Negative for chest pain and palpitations.  Gastrointestinal: Negative for abdominal pain, nausea and vomiting.    Social History  Substance Use Topics  . Smoking status: Never Smoker  . Smokeless tobacco: Never Used  . Alcohol use No   Objective:   BP 138/70 (  BP Location: Left Arm, Patient Position: Sitting, Cuff Size: Large)   Pulse (!) 59   Temp 98.2 F (36.8 C) (Oral)   Resp 16   Ht 5\' 10"  (1.778 m)   Wt 227 lb (103 kg)   SpO2 98%   BMI 32.57 kg/m    Depression screen [facility] 2/9 08/18/2016  Decreased Interest 0  Down, Depressed, Hopeless 0  PHQ - 2 Score 0  Altered sleeping 1  Tired, decreased energy 1  Change in appetite 1  Feeling bad or failure about yourself  2  Trouble concentrating 0  Moving slowly or fidgety/restless 0  Suicidal thoughts 0  PHQ-9 Score 5  Difficult doing work/chores Somewhat difficult     Physical Exam   General Appearance:    Alert,  cooperative, no distress  Eyes:    PERRL, conjunctiva/corneas clear, EOM's intact       Lungs:     Clear to auscultation bilaterally, respirations unlabored  Heart:    Regular rate and rhythm  Neurologic:   Awake, alert, oriented x 3. No apparent focal neurological           defect.       Results for orders placed or performed in visit on 08/18/16  POCT glycosylated hemoglobin (Hb A1C)  Result Value Ref Range   Hemoglobin A1C 11.1    Est. average glucose Bld gHb Est-mCnc 272        Assessment & Plan:     1. Type 2 diabetes mellitus with other diabetic kidney complication, with long-term current use of insulin (HCC) Uncontrolled. Increase Lantus to 30. Continue SSI novolog. Follow up 3 months.  - POCT glycosylated hemoglobin (Hb A1C) - Insulin Glargine (LANTUS SOLOSTAR) 100 UNIT/ML Solostar Pen; Inject 30 Units into the skin daily at 10 pm.  Dispense: 1 pen; Refill: 3       [name], MD  [facility]

## 2016-11-18 ENCOUNTER — Encounter: Payer: Self-pay | Admitting: Family Medicine

## 2016-11-18 ENCOUNTER — Ambulatory Visit (INDEPENDENT_AMBULATORY_CARE_PROVIDER_SITE_OTHER): Payer: BLUE CROSS/BLUE SHIELD | Admitting: Family Medicine

## 2016-11-18 VITALS — BP 144/80 | HR 72 | Temp 98.5°F | Resp 16 | Wt 229.0 lb

## 2016-11-18 DIAGNOSIS — E1129 Type 2 diabetes mellitus with other diabetic kidney complication: Secondary | ICD-10-CM | POA: Diagnosis not present

## 2016-11-18 DIAGNOSIS — Z794 Long term (current) use of insulin: Secondary | ICD-10-CM | POA: Diagnosis not present

## 2016-11-18 DIAGNOSIS — E559 Vitamin D deficiency, unspecified: Secondary | ICD-10-CM | POA: Diagnosis not present

## 2016-11-18 DIAGNOSIS — G629 Polyneuropathy, unspecified: Secondary | ICD-10-CM | POA: Diagnosis not present

## 2016-11-18 NOTE — Progress Notes (Signed)
Patient: [name] Male    DOB: Oct 23, 1960   56 y.o.   MRN: [id number] Visit Date: 11/18/2016  Today's Provider: [name], MD   Chief Complaint  Patient presents with  . Diabetes  . Hand Pain   Subjective:    HPI  Diabetes Mellitus Type II, Follow-up:   Lab Results  Component Value Date   HGBA1C 11.1 08/18/2016   HGBA1C 8.7 05/11/2016   HGBA1C 7.8 (H) 02/20/2016    Last seen for diabetes 3 months ago.  Management since then includes increasing Lantus to 30 units daily. He reports good compliance with treatment. He is not having side effects.  Current symptoms include none and have been stable. Home blood sugar records: fasting range: 101 this morning  Episodes of hypoglycemia? no   Current Insulin Regimen: lantus 36 daily and sliding scale novolog Most Recent Eye Exam: Patient has one scheduled later this month.  Weight trend: stable Prior visit with dietician: no Current diet: in general, an "unhealthy" diet Current exercise: none  Pertinent Labs:    Component Value Date/Time   CHOL 167 02/20/2016 1522   TRIG 136 02/20/2016 1522   HDL 43 02/20/2016 1522   LDLCALC 97 02/20/2016 1522   CREATININE 6.58 (H) 11/07/2015 0406   CREATININE 4.95 (H) 07/24/2014 1524    Wt Readings from Last 3 Encounters:  11/18/16 229 lb (103.9 kg)  08/18/16 227 lb (103 kg)  05/11/16 214 lb (97.1 kg)     Hand Pain: Patient comes in today c/o pain in both hands. He reports that he has had pain for about 1 month. He describes pain as an achy feeling with occasional numbness. He reports that his left hand is worse than the right.      Allergies  Allergen Reactions  . Vancomycin Shortness Of Breath    Eyes watering, SOB, wheezing  . Methotrexate Other (See Comments)    Blood count drops  . Tape      Current Outpatient Prescriptions:  .  acetaminophen (TYLENOL) 500 MG tablet, Take 500 mg by mouth every 6 (six) hours as needed., Disp: , Rfl:  .  amLODipine  (NORVASC) 10 MG tablet, TAKE ONE TABLET BY MOUTH ONCE DAILY, Disp: 30 tablet, Rfl: 0 .  calcitRIOL (ROCALTROL) 0.25 MCG capsule, Take 0.5 mcg by mouth 2 (two) times daily. , Disp: , Rfl: 1 .  carvedilol (COREG) 12.5 MG tablet, Take 12.5 mg by mouth 2 (two) times daily., Disp: , Rfl: 3 .  ferrous sulfate 325 (65 FE) MG tablet, Take 325 mg by mouth 2 (two) times daily with a meal., Disp: , Rfl:  .  folic acid (FOLVITE) 1 MG tablet, Take 1 mg by mouth daily., Disp: , Rfl:  .  inFLIXimab (REMICADE) 100 MG injection, Inject into the vein every 6 (six) weeks. , Disp: , Rfl:  .  insulin aspart (NOVOLOG FLEXPEN) 100 UNIT/ML FlexPen, Take 8-15 units three times a day before each meal, Disp: 1 pen, Rfl: 3 .  Insulin Glargine (LANTUS SOLOSTAR) 100 UNIT/ML Solostar Pen, Inject 30 Units into the skin daily at 10 pm., Disp: 1 pen, Rfl: 3 .  ONE TOUCH ULTRA TEST test strip, USE TO CHECK BLOOD SUGAR THREE TIMES DAILY, Disp: 100 each, Rfl: 3 .  pravastatin (PRAVACHOL) 40 MG tablet, Take 40 mg by mouth daily., Disp: , Rfl: 2 .  sildenafil (VIAGRA) 100 MG tablet, Take 1 tablet (100 mg total) by mouth daily as needed.,  Disp: 6 tablet, Rfl: 1 .  sodium bicarbonate 650 MG tablet, Take 650 mg by mouth 2 (two) times daily., Disp: , Rfl:   Review of Systems  Constitutional: Negative.   Respiratory: Negative.   Cardiovascular: Negative.   Endocrine: Negative.   Musculoskeletal: Positive for arthralgias, joint swelling and myalgias.  Neurological: Positive for numbness.    Social History  Substance Use Topics  . Smoking status: Never Smoker  . Smokeless tobacco: Never Used  . Alcohol use No   Objective:   BP (!) 144/80 (BP Location: Right Arm, Patient Position: Sitting, Cuff Size: Large)   Pulse 72   Temp 98.5 F (36.9 C)   Resp 16   Wt 229 lb (103.9 kg)   SpO2 96%   BMI 32.86 kg/m   Physical Exam   General Appearance:    Alert, cooperative, no distress  Eyes:    PERRL, conjunctiva/corneas clear,  EOM's intact       Lungs:     Clear to auscultation bilaterally, respirations unlabored  Heart:    Regular rate and rhythm  Neurologic:   Awake, alert, oriented x 3. No apparent focal neurological           defect.       POCT HgbA1c error due to low hemoglobin.     Assessment & Plan:     1. Type 2 diabetes mellitus with other diabetic kidney complication, with long-term current use of insulin (HCC) Home sugars significantly since titration up on basal insulin. See how serum A1c looks.  - Hemoglobin A1c  2. Avitaminosis D  - VITAMIN D 25 Hydroxy (Vit-D Deficiency, Fractures)  3. Neuropathy ([place])  - VITAMIN D 25 Hydroxy (Vit-D Deficiency, Fractures) - Vitamin B12       [name], MD  [facility]

## 2016-11-19 LAB — VITAMIN B12: Vitamin B-12: 374 pg/mL (ref 232–1245)

## 2016-11-19 LAB — HEMOGLOBIN A1C
Est. average glucose Bld gHb Est-mCnc: 140 mg/dL
Hgb A1c MFr Bld: 6.5 % — ABNORMAL HIGH (ref 4.8–5.6)

## 2016-11-19 LAB — VITAMIN D 25 HYDROXY (VIT D DEFICIENCY, FRACTURES): Vit D, 25-Hydroxy: 17.5 ng/mL — ABNORMAL LOW (ref 30.0–100.0)

## 2016-12-06 ENCOUNTER — Encounter: Payer: Self-pay | Admitting: Family Medicine

## 2016-12-09 ENCOUNTER — Other Ambulatory Visit: Payer: Self-pay | Admitting: Family Medicine

## 2016-12-10 ENCOUNTER — Other Ambulatory Visit: Payer: Self-pay | Admitting: Family Medicine

## 2016-12-10 MED ORDER — GLUCOSE BLOOD VI STRP: ORAL_STRIP | 12 refills | Status: DC

## 2016-12-10 MED ORDER — BAYER CONTOUR MONITOR W/DEVICE KIT: PACK | 0 refills | Status: AC

## 2017-03-31 ENCOUNTER — Emergency Department: Payer: BLUE CROSS/BLUE SHIELD

## 2017-03-31 ENCOUNTER — Emergency Department
Admission: EM | Admit: 2017-03-31 | Discharge: 2017-03-31 | Disposition: A | Discharge status: Home / Self Care | Payer: BLUE CROSS/BLUE SHIELD | Attending: Emergency Medicine | Admitting: Emergency Medicine

## 2017-03-31 ENCOUNTER — Encounter: Payer: Self-pay | Admitting: Emergency Medicine

## 2017-03-31 DIAGNOSIS — Y929 Unspecified place or not applicable: Secondary | ICD-10-CM | POA: Insufficient documentation

## 2017-03-31 DIAGNOSIS — E119 Type 2 diabetes mellitus without complications: Secondary | ICD-10-CM | POA: Diagnosis not present

## 2017-03-31 DIAGNOSIS — I120 Hypertensive chronic kidney disease with stage 5 chronic kidney disease or end stage renal disease: Secondary | ICD-10-CM | POA: Insufficient documentation

## 2017-03-31 DIAGNOSIS — Y999 Unspecified external cause status: Secondary | ICD-10-CM | POA: Insufficient documentation

## 2017-03-31 DIAGNOSIS — Z992 Dependence on renal dialysis: Secondary | ICD-10-CM | POA: Diagnosis not present

## 2017-03-31 DIAGNOSIS — Y93K9 Activity, other involving animal care: Secondary | ICD-10-CM | POA: Diagnosis not present

## 2017-03-31 DIAGNOSIS — L231 Allergic contact dermatitis due to adhesives: Secondary | ICD-10-CM | POA: Diagnosis not present

## 2017-03-31 DIAGNOSIS — S299XXA Unspecified injury of thorax, initial encounter: Secondary | ICD-10-CM | POA: Diagnosis present

## 2017-03-31 DIAGNOSIS — W010XXA Fall on same level from slipping, tripping and stumbling without subsequent striking against object, initial encounter: Secondary | ICD-10-CM | POA: Insufficient documentation

## 2017-03-31 DIAGNOSIS — S2232XA Fracture of one rib, left side, initial encounter for closed fracture: Secondary | ICD-10-CM | POA: Insufficient documentation

## 2017-03-31 DIAGNOSIS — Z794 Long term (current) use of insulin: Secondary | ICD-10-CM | POA: Diagnosis not present

## 2017-03-31 DIAGNOSIS — N186 End stage renal disease: Secondary | ICD-10-CM | POA: Diagnosis not present

## 2017-03-31 MED ORDER — MELOXICAM 7.5 MG PO TABS: 7.5000 mg | ORAL_TABLET | Freq: Every day | ORAL | 1 refills | Status: AC

## 2017-03-31 MED ORDER — OXYCODONE-ACETAMINOPHEN 5-325 MG PO TABS: 1.0000 | ORAL_TABLET | Freq: Once | ORAL | Status: DC

## 2017-03-31 MED ORDER — OXYCODONE-ACETAMINOPHEN 5-325 MG PO TABS: 1.0000 | ORAL_TABLET | Freq: Three times a day (TID) | ORAL | 0 refills | Status: AC | PRN

## 2017-03-31 NOTE — ED Provider Notes (Signed)
[facility] Emergency Department Provider Note  ____________________________________________  Time seen: Approximately 7:33 PM  I have reviewed the triage vital signs and the nursing notes.   HISTORY  Chief Complaint Fall and Rib Injury    HPI [name] is a 56 y.o. male presenting to the emergency department with 6 out of 10 left anterior rib pain after patient was chasing a neighbor's dog earlier this morning. Patient states that he fell during the chase. Patient did not hit his head or lose consciousness. Patient reports that his symptoms are worsened with deep inspiration and with movement or coughing. Patient has been taking Tylenol. He denies chest tightness, chest pain or shortness of breath.   Past Medical History:  Diagnosis Date  . Anemia   . Crohn disease ([place])   . Diabetes mellitus without complication ([place])   . DVT of lower extremity (deep venous thrombosis) ([place]) 2016  . Hidradenitis suppurativa   . Hypertension   . Renal disorder     Patient Active Problem List   Diagnosis Date Noted  . End stage renal failure on dialysis ([place]) 04/12/2016  . Aphthae 02/20/2016  . Hidradenitis 02/20/2016  . Leg pain 02/20/2016  . Microalbuminuria 02/20/2016  . Neuropathy 02/20/2016  . Chest pain 02/20/2016  . Pyogenic arthritis of knee ([place]) 02/04/2016  . Narrowing of intervertebral disc space 08/29/2015  . Vascular disorder of lower extremity 08/29/2015  . Failure of erection 08/29/2015  . Accumulation of fluid in tissues 08/29/2015  . Hypercholesteremia 08/29/2015  . BP (high blood pressure) 08/29/2015  . Anemia due to chronic kidney disease 05/26/2015  . Anemia in chronic illness 05/26/2015  . Venous insufficiency of leg 09/04/2014  . Deep vein thrombosis of lower extremity ([place]) 08/16/2014  . Prostatic intraepithelial neoplasia 11/02/2013  . Abnormal prostate specific antigen 09/11/2013  . Elevated prostate specific antigen (PSA)  09/11/2013  . Benign prostatic hyperplasia with urinary obstruction 08/13/2013  . Spermatocele 08/13/2013  . CD (Crohn's disease) ([place]) 02/28/2013  . Chronic kidney disease, stage V ([place]) 01/25/2013  . Avitaminosis D 01/25/2013  . Abnormal presence of protein in urine 03/28/2012  . Diabetes ([place]) 03/28/2012  . Absolute anemia 08/03/2011  . Crohn's disease ([place]) 08/03/2011    Past Surgical History:  Procedure Laterality Date  . ABDOMINAL SURGERY    . KNEE SURGERY Left 02/04/2016   [facility]    Prior to Admission medications   Medication Sig Start Date End Date Taking? Authorizing Provider  acetaminophen (TYLENOL) 500 MG tablet Take 500 mg by mouth every 6 (six) hours as needed.    [provider]  amLODipine (NORVASC) 10 MG tablet TAKE ONE TABLET BY MOUTH ONCE DAILY 08/29/15   Margarita Rana, MD  calcitRIOL (ROCALTROL) 0.25 MCG capsule Take 0.5 mcg by mouth 2 (two) times daily.  11/02/15   [provider]  carvedilol (COREG) 12.5 MG tablet Take 12.5 mg by mouth 2 (two) times daily. 11/02/15   [provider]  ferrous sulfate 325 (65 FE) MG tablet Take 325 mg by mouth 2 (two) times daily with a meal.    [provider]  folic acid (FOLVITE) 1 MG tablet Take 1 mg by mouth daily.    [provider]  glucose blood (BAYER CONTOUR TEST) test strip Check sugar three times daily for insulin dependent diabetes 12/10/16   [name], MD  inFLIXimab (REMICADE) 100 MG injection Inject into the vein every 6 (six) weeks.     [provider]  insulin aspart (NOVOLOG FLEXPEN) 100 UNIT/ML FlexPen Take 8-15 units three times a day before each meal 08/18/16   [name], MD  LANTUS SOLOSTAR 100 UNIT/ML Solostar Pen INJECT 45 UNITS SUBCUTANEOUSLY ONCE DAILY -  INCREASE  BY  1  UNIT  DAILY  UNTIL  SUGARS  ARE  IMPROVED 12/09/16   [name], MD  meloxicam (MOBIC) 7.5 MG tablet Take 1 tablet (7.5 mg total) by mouth daily. 03/31/17 04/07/17  Lannie Fields, PA-C  oxyCODONE-acetaminophen (ROXICET) 5-325 MG tablet Take 1 tablet by mouth every 8 (eight) hours as needed for severe pain. 03/31/17 04/03/17  Lannie Fields, PA-C  pravastatin (PRAVACHOL) 40 MG tablet Take 40 mg by mouth daily. 10/24/15   [provider]  sildenafil (VIAGRA) 100 MG tablet Take 1 tablet (100 mg total) by mouth daily as needed. 04/27/16   [name], MD  sodium bicarbonate 650 MG tablet Take 650 mg by mouth 2 (two) times daily.    [provider]    Allergies Vancomycin; Methotrexate; and Tape  Family History  Problem Relation Age of Onset  . Irritable bowel syndrome Sister   . Diabetes Sister   . Heart disease Mother   . Diabetes Mother   . Heart disease Father   . Rheumatic fever Father        as child  . Psoriasis Brother   . Arthritis Brother   . Diabetes Sister   . Diabetes Sister     Social History Social History  Substance Use Topics  . Smoking status: Never Smoker  . Smokeless tobacco: Never Used  . Alcohol use No     Review of Systems  Constitutional: No fever/chills Eyes: No visual changes. No discharge ENT: No upper respiratory complaints. Cardiovascular: no chest pain. Respiratory: no cough. No SOB. Musculoskeletal: Patient has left anterior rib pain.  Skin: Negative for rash, abrasions, lacerations, ecchymosis. Neurological: Negative for headaches, focal weakness or numbness.  ____________________________________________   PHYSICAL EXAM:  VITAL SIGNS: ED Triage Vitals  Enc Vitals Group     BP 03/31/17 1805 (!) 176/85     Pulse Rate 03/31/17 1805 64     Resp 03/31/17 1805 18     Temp 03/31/17 1805 98.7 F (37.1 C)     Temp Source 03/31/17 1805 Oral     SpO2 03/31/17 1805 97 %     Weight 03/31/17 1805 226 lb (102.5 kg)     Height 03/31/17 1805 5' 11"  (1.803 m)     Head Circumference --      Peak Flow --      Pain Score 03/31/17 1811 3     Pain Loc --      Pain Edu? --      Excl. in [place]? --       Constitutional: Alert and oriented. Well appearing and in no acute distress. Eyes: Conjunctivae are normal. PERRL. EOMI. Head: Atraumatic. Neck: Full range of motion. No pain with palpation along the cervical spine. Cardiovascular: Normal rate, regular rhythm. Normal S1 and S2.  Good peripheral circulation. Respiratory: Normal respiratory effort without tachypnea or retractions. Lungs CTAB. Good air entry to the bases with no decreased or absent breath sounds. Musculoskeletal: Patient has 5 out of 5 strength in the upper extremities bilaterally. Patient has pain elicited to palpation over the left anterior ribs, 6 through 10th. Palpable radial, ulnar and dorsalis pedis pulses bilaterally and symmetrically. Neurologic:  Normal speech and language. No  gross focal neurologic deficits are appreciated.  Skin:  Skin is warm, dry and intact. No rash noted. Psychiatric: Mood and affect are normal. Speech and behavior are normal. Patient exhibits appropriate insight and judgement.   ____________________________________________   LABS (all labs ordered are listed, but only abnormal results are displayed)  Labs Reviewed - No data to display ____________________________________________  EKG   ____________________________________________  RADIOLOGY Unk Pinto, personally viewed and evaluated these images (plain radiographs) as part of my medical decision making, as well as reviewing the written report by the radiologist.  Dg Ribs Unilateral W/chest Left  Result Date: 03/31/2017 CLINICAL DATA:  Patient fell while chasing a dog today. Pain to the left ribs EXAM: LEFT RIBS AND CHEST - 3+ VIEW COMPARISON:  None. FINDINGS: The lungs are clear without focal pneumonia, edema, pneumothorax or pleural effusion. Interstitial markings are diffusely coarsened with chronic features. The cardiopericardial silhouette is within normal limits for size. Oblique views of the left ribs the show  nondisplaced fracture of the left sixth rib. IMPRESSION: Nondisplaced fracture left sixth rib. Electronically Signed   By: Misty Stanley M.D.   On: 03/31/2017 19:03    ____________________________________________    PROCEDURES  Procedure(s) performed:    Procedures    Medications - No data to display   ____________________________________________   INITIAL IMPRESSION / ASSESSMENT AND PLAN / ED COURSE  Pertinent labs & imaging results that were available during my care of the patient were reviewed by me and considered in my medical decision making (see chart for details).  Review of the [place] CSRS was performed in accordance of the [place] prior to dispensing any controlled drugs.     Assessment and plan: Left Rib Fracture Patient presents to the emergency department with left anterior rib pain. DG left ribs reveals a nondisplaced sixth anterior rib fracture. Patient was given a Roxicet the emergency department. He was discharged with Mobic and Roxicet for pain and inflammation. A referral was made to orthopedics, Dr. Mack Guise. Vital signs are reassuring prior to discharge. All patient questions were answered. ____________________________________________  FINAL CLINICAL IMPRESSION(S) / ED DIAGNOSES  Final diagnoses:  Closed fracture of one rib of left side, initial encounter      NEW MEDICATIONS STARTED DURING THIS VISIT:  New Prescriptions   MELOXICAM (MOBIC) 7.5 MG TABLET    Take 1 tablet (7.5 mg total) by mouth daily.   OXYCODONE-ACETAMINOPHEN (ROXICET) 5-325 MG TABLET    Take 1 tablet by mouth every 8 (eight) hours as needed for severe pain.        This chart was dictated using voice recognition software/Dragon. Despite best efforts to proofread, errors can occur which can change the meaning. Any change was purely unintentional.    Lannie Fields, PA-C 03/31/17 1941    Drenda Freeze, MD 03/31/17 2014

## 2017-03-31 NOTE — ED Notes (Signed)
See triage note presents with pain to left lateral rib  S/p fall states he was chasing a dog

## 2017-03-31 NOTE — ED Triage Notes (Signed)
Pt to ED via POV with c/o mechanical fall after chasing  dog today. Pt c/o pain to LEFT rib cage area, worsens with cough. Pt ambulatory, denies any LOC or other injuries.

## 2017-03-31 NOTE — ED Notes (Signed)

## 2017-04-13 ENCOUNTER — Emergency Department
Admission: EM | Admit: 2017-04-13 | Discharge: 2017-04-13 | Disposition: A | Discharge status: Other Acute Inpatient Hospital | Payer: BLUE CROSS/BLUE SHIELD | Attending: Emergency Medicine | Admitting: Emergency Medicine

## 2017-04-13 ENCOUNTER — Emergency Department: Payer: BLUE CROSS/BLUE SHIELD

## 2017-04-13 DIAGNOSIS — Z992 Dependence on renal dialysis: Secondary | ICD-10-CM | POA: Diagnosis not present

## 2017-04-13 DIAGNOSIS — I629 Nontraumatic intracranial hemorrhage, unspecified: Secondary | ICD-10-CM | POA: Diagnosis not present

## 2017-04-13 DIAGNOSIS — N186 End stage renal disease: Secondary | ICD-10-CM | POA: Diagnosis not present

## 2017-04-13 DIAGNOSIS — R4182 Altered mental status, unspecified: Secondary | ICD-10-CM | POA: Diagnosis present

## 2017-04-13 DIAGNOSIS — E871 Hypo-osmolality and hyponatremia: Secondary | ICD-10-CM | POA: Insufficient documentation

## 2017-04-13 DIAGNOSIS — R739 Hyperglycemia, unspecified: Secondary | ICD-10-CM

## 2017-04-13 DIAGNOSIS — E1122 Type 2 diabetes mellitus with diabetic chronic kidney disease: Secondary | ICD-10-CM | POA: Insufficient documentation

## 2017-04-13 DIAGNOSIS — Z794 Long term (current) use of insulin: Secondary | ICD-10-CM | POA: Diagnosis not present

## 2017-04-13 DIAGNOSIS — G40901 Epilepsy, unspecified, not intractable, with status epilepticus: Secondary | ICD-10-CM | POA: Diagnosis not present

## 2017-04-13 DIAGNOSIS — E1165 Type 2 diabetes mellitus with hyperglycemia: Secondary | ICD-10-CM | POA: Diagnosis not present

## 2017-04-13 DIAGNOSIS — I120 Hypertensive chronic kidney disease with stage 5 chronic kidney disease or end stage renal disease: Secondary | ICD-10-CM | POA: Diagnosis not present

## 2017-04-13 DIAGNOSIS — I619 Nontraumatic intracerebral hemorrhage, unspecified: Secondary | ICD-10-CM

## 2017-04-13 LAB — BLOOD GAS, ARTERIAL
Acid-base deficit: 1.5 mmol/L (ref 0.0–2.0)
Bicarbonate: 22.8 mmol/L (ref 20.0–28.0)
FIO2: 0.5
MECHVT: 500 mL
Mechanical Rate: 14
O2 Saturation: 87.4 %
PEEP: 10 cmH2O
Patient temperature: 37
RATE: 24 resp/min
pCO2 arterial: 36 mmHg (ref 32.0–48.0)
pH, Arterial: 7.41 (ref 7.350–7.450)
pO2, Arterial: 53 mmHg — ABNORMAL LOW (ref 83.0–108.0)

## 2017-04-13 LAB — CBC WITH DIFFERENTIAL/PLATELET
Basophils Absolute: 0 10*3/uL (ref 0–0.1)
Basophils Relative: 0 %
Eosinophils Absolute: 0 10*3/uL (ref 0–0.7)
Eosinophils Relative: 0 %
HCT: 35.1 % — ABNORMAL LOW (ref 40.0–52.0)
Hemoglobin: 10.9 g/dL — ABNORMAL LOW (ref 13.0–18.0)
Lymphocytes Relative: 4 %
Lymphs Abs: 0.5 10*3/uL — ABNORMAL LOW (ref 1.0–3.6)
MCH: 27.1 pg (ref 26.0–34.0)
MCHC: 31.1 g/dL — ABNORMAL LOW (ref 32.0–36.0)
MCV: 86.9 fL (ref 80.0–100.0)
Monocytes Absolute: 0.5 10*3/uL (ref 0.2–1.0)
Monocytes Relative: 3 %
Neutro Abs: 12.8 10*3/uL — ABNORMAL HIGH (ref 1.4–6.5)
Neutrophils Relative %: 93 %
Platelets: 270 10*3/uL (ref 150–440)
RBC: 4.04 MIL/uL — ABNORMAL LOW (ref 4.40–5.90)
RDW: 17.5 % — ABNORMAL HIGH (ref 11.5–14.5)
WBC: 13.9 10*3/uL — ABNORMAL HIGH (ref 3.8–10.6)

## 2017-04-13 LAB — URINE DRUG SCREEN, QUALITATIVE (ARMC ONLY)
Amphetamines, Ur Screen: NOT DETECTED
Barbiturates, Ur Screen: NOT DETECTED
Benzodiazepine, Ur Scrn: NOT DETECTED
Cannabinoid 50 Ng, Ur ~~LOC~~: POSITIVE — AB
Cocaine Metabolite,Ur ~~LOC~~: NOT DETECTED
MDMA (Ecstasy)Ur Screen: NOT DETECTED
Methadone Scn, Ur: NOT DETECTED
Opiate, Ur Screen: NOT DETECTED
Phencyclidine (PCP) Ur S: NOT DETECTED
Tricyclic, Ur Screen: NOT DETECTED

## 2017-04-13 LAB — SALICYLATE LEVEL: Salicylate Lvl: <7 mg/dL (ref 2.8–30.0)

## 2017-04-13 LAB — COMPREHENSIVE METABOLIC PANEL
ALT: 22 U/L (ref 17–63)
AST: 19 U/L (ref 15–41)
Albumin: 3.4 g/dL — ABNORMAL LOW (ref 3.5–5.0)
Alkaline Phosphatase: 90 U/L (ref 38–126)
Anion gap: 14 (ref 5–15)
BUN: 74 mg/dL — ABNORMAL HIGH (ref 6–20)
CO2: 26 mmol/L (ref 22–32)
Calcium: 8.1 mg/dL — ABNORMAL LOW (ref 8.9–10.3)
Chloride: 84 mmol/L — ABNORMAL LOW (ref 101–111)
Creatinine, Ser: 12.35 mg/dL — ABNORMAL HIGH (ref 0.61–1.24)
GFR calc Af Amer: 5 mL/min — ABNORMAL LOW (ref >60)
GFR calc non Af Amer: 4 mL/min — ABNORMAL LOW (ref >60)
Glucose, Bld: 919 mg/dL (ref 65–99)
Potassium: 3.9 mmol/L (ref 3.5–5.1)
Sodium: 124 mmol/L — ABNORMAL LOW (ref 135–145)
Total Bilirubin: 0.7 mg/dL (ref 0.3–1.2)
Total Protein: 7.9 g/dL (ref 6.5–8.1)

## 2017-04-13 LAB — URINALYSIS, COMPLETE (UACMP) WITH MICROSCOPIC
Bacteria, UA: NONE SEEN
Bilirubin Urine: NEGATIVE
Glucose, UA: >=500 mg/dL — AB
Ketones, ur: NEGATIVE mg/dL
Leukocytes, UA: NEGATIVE
Nitrite: NEGATIVE
Protein, ur: 100 mg/dL — AB
Specific Gravity, Urine: 1.016 (ref 1.005–1.030)
pH: 6 (ref 5.0–8.0)

## 2017-04-13 LAB — ETHANOL: Alcohol, Ethyl (B): <5 mg/dL (ref <5)

## 2017-04-13 LAB — PROTIME-INR
INR: 1.1
Prothrombin Time: 14.2 seconds (ref 11.4–15.2)

## 2017-04-13 LAB — ACETAMINOPHEN LEVEL: Acetaminophen (Tylenol), Serum: <10 ug/mL — ABNORMAL LOW (ref 10–30)

## 2017-04-13 MED ORDER — ROCURONIUM BROMIDE 50 MG/5ML IV SOLN
INTRAVENOUS | Status: AC | PRN
  Administered 2017-04-13: 80 mg via INTRAVENOUS

## 2017-04-13 MED ORDER — SODIUM CHLORIDE 0.9 % IV BOLUS (SEPSIS)
1000.0000 mL | Freq: Once | INTRAVENOUS | Status: AC
  Administered 2017-04-13: 1000 mL via INTRAVENOUS

## 2017-04-13 MED ORDER — PHENYTOIN SODIUM 50 MG/ML IJ SOLN
1000.0000 mg | Freq: Once | INTRAMUSCULAR | Status: AC
  Administered 2017-04-13: 1000 mg via INTRAVENOUS
  Filled 2017-04-13: qty 20

## 2017-04-13 MED ORDER — FENTANYL CITRATE (PF) 100 MCG/2ML IJ SOLN
INTRAMUSCULAR | Status: AC
  Filled 2017-04-13: qty 2

## 2017-04-13 MED ORDER — FENTANYL CITRATE (PF) 100 MCG/2ML IJ SOLN
100.0000 ug | INTRAMUSCULAR | Status: DC | PRN
  Administered 2017-04-13: 100 ug via INTRAVENOUS

## 2017-04-13 MED ORDER — PROPOFOL 1000 MG/100ML IV EMUL
0.0000 ug/kg/min | INTRAVENOUS | Status: DC
  Administered 2017-04-13: 60 ug/kg/min via INTRAVENOUS
  Administered 2017-04-13: 40 ug/kg/min via INTRAVENOUS
  Filled 2017-04-13 (×2): qty 100

## 2017-04-13 MED ORDER — LORAZEPAM 2 MG/ML IJ SOLN
2.0000 mg | Freq: Once | INTRAMUSCULAR | Status: AC
  Administered 2017-04-13: 2 mg via INTRAVENOUS

## 2017-04-13 MED ORDER — LABETALOL HCL 5 MG/ML IV SOLN
10.0000 mg | INTRAVENOUS | Status: DC | PRN
  Filled 2017-04-13: qty 4

## 2017-04-13 MED ORDER — FENTANYL CITRATE (PF) 100 MCG/2ML IJ SOLN
INTRAMUSCULAR | Status: DC | PRN
  Administered 2017-04-13: 100 ug via INTRAVENOUS

## 2017-04-13 MED ORDER — PROPOFOL 1000 MG/100ML IV EMUL
INTRAVENOUS | Status: AC | PRN
  Administered 2017-04-13: 20 ug/kg/min via INTRAVENOUS

## 2017-04-13 MED ORDER — PROPOFOL 10 MG/ML IV BOLUS
INTRAVENOUS | Status: AC | PRN
  Administered 2017-04-13: 200 mg via INTRAVENOUS

## 2017-04-13 MED ORDER — SODIUM CHLORIDE 0.9 % IV SOLN
1000.0000 mg | Freq: Once | INTRAVENOUS | Status: AC
  Administered 2017-04-13: 1000 mg via INTRAVENOUS
  Filled 2017-04-13: qty 20

## 2017-04-13 NOTE — ED Notes (Signed)
[facility] transfer center for Neurosurgery ICU  Spoke to [place]

## 2017-04-13 NOTE — ED Notes (Signed)
Pt left with Brigid Re and Sarah. Pt propofol discontinued by transport.

## 2017-04-13 NOTE — ED Notes (Signed)
[facility] accepted patient to MICU  1615

## 2017-04-13 NOTE — ED Triage Notes (Signed)
Pt arrives to ER with seizure like activity and altered mental status.

## 2017-04-13 NOTE — ED Notes (Signed)
Pt resting comfortably. Keeping hands still at this time.

## 2017-04-13 NOTE — ED Notes (Signed)
HOB elevated to approx 30 degrees per order from Dr. Joni Fears.

## 2017-04-13 NOTE — ED Notes (Signed)
No call back from [facility] transport regarding if patient is going by ground or air. Helicopter here.

## 2017-04-13 NOTE — ED Notes (Signed)
New bottle of propofol hung.

## 2017-04-13 NOTE — ED Notes (Addendum)
Report given to [facility], at MICU and Wells Guiles with [employer].  States that trasnport will call back with regards to if patient is going via helicopter or ground.

## 2017-04-13 NOTE — ED Notes (Signed)
Critical result of glucose 919 verbal readback to Dr. Joni Fears.

## 2017-04-13 NOTE — ED Notes (Signed)
Called Lynn at CT Scan to [employer] all images  [phone]

## 2017-04-13 NOTE — ED Provider Notes (Signed)
[facility] Emergency Department Provider Note  ____________________________________________  Time seen: Approximately 2:15 PM  I have reviewed the triage vital signs and the nursing notes.   HISTORY  Chief Complaint Altered mental status Level 5 caveat:  Portions of the history and physical were unable to be obtained due to: Unresponsive    HPI [name] is a 56 y.o. male comes to the ED with altered mental status. EMS report that the patient had been observed to be having generalized shaking activity for 2 days according to the family. At other times they had told EMS that it was all morning today. Patient was noted to have bloody emesis at home as well. Family had reported the patient had a fall 1 week ago.     Past Medical History:  Diagnosis Date  . Anemia   . Crohn disease ([place])   . Diabetes mellitus without complication ([place])   . DVT of lower extremity (deep venous thrombosis) ([place]) 2016  . Hidradenitis suppurativa   . Hypertension   . Renal disorder      Patient Active Problem List   Diagnosis Date Noted  . End stage renal failure on dialysis ([place]) 04/12/2016  . Aphthae 02/20/2016  . Hidradenitis 02/20/2016  . Leg pain 02/20/2016  . Microalbuminuria 02/20/2016  . Neuropathy 02/20/2016  . Chest pain 02/20/2016  . Pyogenic arthritis of knee ([place]) 02/04/2016  . Narrowing of intervertebral disc space 08/29/2015  . Vascular disorder of lower extremity 08/29/2015  . Failure of erection 08/29/2015  . Accumulation of fluid in tissues 08/29/2015  . Hypercholesteremia 08/29/2015  . BP (high blood pressure) 08/29/2015  . Anemia due to chronic kidney disease 05/26/2015  . Anemia in chronic illness 05/26/2015  . Venous insufficiency of leg 09/04/2014  . Deep vein thrombosis of lower extremity ([place]) 08/16/2014  . Prostatic intraepithelial neoplasia 11/02/2013  . Abnormal prostate specific antigen 09/11/2013  . Elevated prostate specific  antigen (PSA) 09/11/2013  . Benign prostatic hyperplasia with urinary obstruction 08/13/2013  . Spermatocele 08/13/2013  . CD (Crohn's disease) ([place]) 02/28/2013  . Chronic kidney disease, stage V ([place]) 01/25/2013  . Avitaminosis D 01/25/2013  . Abnormal presence of protein in urine 03/28/2012  . Diabetes ([place]) 03/28/2012  . Absolute anemia 08/03/2011  . Crohn's disease ([place]) 08/03/2011     Past Surgical History:  Procedure Laterality Date  . ABDOMINAL SURGERY    . KNEE SURGERY Left 02/04/2016   [facility]     Prior to Admission medications   Medication Sig Start Date End Date Taking? Authorizing Provider  amLODipine (NORVASC) 10 MG tablet TAKE ONE TABLET BY MOUTH ONCE DAILY 08/29/15  Yes Margarita Rana, MD  calcitRIOL (ROCALTROL) 0.25 MCG capsule Take 0.5 mcg by mouth 2 (two) times daily.  11/02/15  Yes [provider]  carvedilol (COREG) 25 MG tablet Take 25 mg by mouth 2 (two) times daily.  11/02/15  Yes [provider]  ferrous sulfate 325 (65 FE) MG tablet Take 325 mg by mouth 2 (two) times daily with a meal.   Yes [provider]  folic acid (FOLVITE) 1 MG tablet Take 1 mg by mouth daily.   Yes [provider]  LANTUS SOLOSTAR 100 UNIT/ML Solostar Pen INJECT 45 UNITS SUBCUTANEOUSLY ONCE DAILY -  INCREASE  BY  1  UNIT  DAILY  UNTIL  SUGARS  ARE  IMPROVED 12/09/16  Yes [name], MD  pravastatin (PRAVACHOL) 40 MG tablet Take 40 mg by mouth daily.  10/24/15  Yes [provider]  acetaminophen (TYLENOL) 500 MG tablet Take 500 mg by mouth every 6 (six) hours as needed.    [provider]  glucose blood (BAYER CONTOUR TEST) test strip Check sugar three times daily for insulin dependent diabetes 12/10/16   [name], MD  inFLIXimab (REMICADE) 100 MG injection Inject into the vein every 6 (six) weeks.     [provider]  insulin aspart (NOVOLOG FLEXPEN) 100 UNIT/ML FlexPen Take 8-15 units three times a day before each meal  08/18/16   [name], MD  sildenafil (VIAGRA) 100 MG tablet Take 1 tablet (100 mg total) by mouth daily as needed. 04/27/16   [name], MD  sodium bicarbonate 650 MG tablet Take 650 mg by mouth 2 (two) times daily.    [provider]     Allergies Vancomycin; Methotrexate; and Tape   Family History  Problem Relation Age of Onset  . Irritable bowel syndrome Sister   . Diabetes Sister   . Heart disease Mother   . Diabetes Mother   . Heart disease Father   . Rheumatic fever Father        as child  . Psoriasis Brother   . Arthritis Brother   . Diabetes Sister   . Diabetes Sister     Social History Social History  Substance Use Topics  . Smoking status: Never Smoker  . Smokeless tobacco: Never Used  . Alcohol use No    Review of Systems Unable to obtain due to altered mental status  ____________________________________________   PHYSICAL EXAM:  VITAL SIGNS: ED Triage Vitals [04/13/17 1348]  Enc Vitals Group     BP (!) 166/115     Pulse Rate (!) 101     Resp (!) 33     Temp      Temp src      SpO2 94 %     Weight      Height      Head Circumference      Peak Flow      Pain Score      Pain Loc      Pain Edu?      Excl. in Winter?     Vital signs reviewed, nursing assessments reviewed.   Constitutional:   Seizing, unresponsive. Eyes:   No scleral icterus.  Gaze fixed to the left ENT   Head:   Normocephalic and atraumatic.   Nose:   No congestion/rhinnorhea. No epistaxis   Mouth/Throat:   Small laceration of left lateral tongue. No gag reflex..    Neck:   No meningismus. No crepitus Hematological/Lymphatic/Immunilogical:   No cervical lymphadenopathy. Cardiovascular:   Tachycardia heart rate 105. Symmetric bilateral radial and DP pulses.  No murmurs.  Respiratory:   Spontaneous respirations, shallow and irregular. Initial oxygen saturation 94-96% on nonrebreather.. Gastrointestinal:   Soft and nontender. Non distended.  There is no CVA tenderness.  No rebound, rigidity, or guarding. No ecchymosis. PD catheter in place Genitourinary:   Unremarkable. Extensive scars tissue likely related to history of hidradenitis Musculoskeletal:   Normal range of motion in all extremities. No joint effusions.  No lower extremity tenderness.  No edema. Neurologic:   Clinically seizing, unresponsive Skin:    Skin is warm, dry and intact. No rash noted.  No petechiae, purpura, or bullae.  ____________________________________________    LABS (pertinent positives/negatives) (all labs ordered are listed, but only abnormal results are displayed) Labs Reviewed  COMPREHENSIVE METABOLIC  PANEL - Abnormal; Notable for the following:       Result Value   Sodium 124 (*)    Chloride 84 (*)    Glucose, Bld 919 (*)    BUN 74 (*)    Creatinine, Ser 12.35 (*)    Calcium 8.1 (*)    Albumin 3.4 (*)    GFR calc non Af Amer 4 (*)    GFR calc Af Amer 5 (*)    All other components within normal limits  ACETAMINOPHEN LEVEL - Abnormal; Notable for the following:    Acetaminophen (Tylenol), Serum <10 (*)    All other components within normal limits  CBC WITH DIFFERENTIAL/PLATELET - Abnormal; Notable for the following:    WBC 13.9 (*)    RBC 4.04 (*)    Hemoglobin 10.9 (*)    HCT 35.1 (*)    MCHC 31.1 (*)    RDW 17.5 (*)    Neutro Abs 12.8 (*)    Lymphs Abs 0.5 (*)    All other components within normal limits  URINALYSIS, COMPLETE (UACMP) WITH MICROSCOPIC - Abnormal; Notable for the following:    Color, Urine YELLOW (*)    APPearance HAZY (*)    Glucose, UA >=500 (*)    Hgb urine dipstick MODERATE (*)    Protein, ur 100 (*)    Squamous Epithelial / LPF 0-5 (*)    All other components within normal limits  URINE DRUG SCREEN, QUALITATIVE ([facility] ONLY) - Abnormal; Notable for the following:    Cannabinoid 50 Ng, Ur Ecru POSITIVE (*)    All other components within normal limits  BLOOD GAS, ARTERIAL - Abnormal; Notable for the following:     pO2, Arterial 53 (*)    All other components within normal limits  URINE CULTURE  ETHANOL  SALICYLATE LEVEL  PROTIME-INR  TYPE AND SCREEN  TYPE AND SCREEN   ____________________________________________   EKG  Interpreted by me Sinus rhythm rate of 86, normal axis, prolonged QTC of 511. Normal QRS ST segments and T waves. No acute ischemic changes.  ____________________________________________    RADIOLOGY  Ct Head Wo Contrast  Result Date: 04/13/2017 CLINICAL DATA:  Status epilepticus. Altered mental status. Fall 1 week ago. EXAM: CT HEAD WITHOUT CONTRAST TECHNIQUE: Contiguous axial images were obtained from the base of the skull through the vertex without intravenous contrast. COMPARISON:  CT head 11/06/2015 FINDINGS: Brain: Right frontal lobe hypodensity and involving cortex and white matter. Scattered small areas of mild hyper density within the hypodensity likely due to hemorrhagic infarction. Mass lesion considered less likely. Ventricle size normal. No shift of the midline structure. No other acute infarct. Vascular: Negative for hyperdense vessel. Skull: Negative Sinuses/Orbits: Mucosal edema paranasal sinuses.  Normal orbit Other: Non IMPRESSION: Right frontal hypodensity most compatible with subacute infarct and superimposed mild hemorrhage. MRI brain without with contrast suggested confirm and to exclude underlying mass lesion. These results were called by telephone at the time of interpretation on 04/13/2017 at 2:49 pm to Dr. Carrie Mew , who verbally acknowledged these results. Electronically Signed   By: Franchot Gallo M.D.   On: 04/13/2017 14:49   Dg Chest Portable 1 View  Result Date: 04/13/2017 CLINICAL DATA:  Intubation. EXAM: PORTABLE CHEST 1 VIEW COMPARISON:  03/31/2017. FINDINGS: Endotracheal tube noted with tip 2.6 cm above the carina. NG tube noted with tip below left hemidiaphragm. Mild gastric distention. Heart size normal. No focal infiltrate. No pleural  effusion or pneumothorax . IMPRESSION: 1. Endotracheal tube  noted with tip 2.6 cm above the carina. NG tube noted with tip projected over the stomach. Mild gastric distention. 2. No acute cardiopulmonary disease . Electronically Signed   By: Marcello Moores  Register   On: 04/13/2017 14:22    ____________________________________________   PROCEDURES Procedures CRITICAL CARE Performed by: Carrie Mew   Total critical care time: 35 minutes  Critical care time was exclusive of separately billable procedures and treating other patients.  Critical care was necessary to treat or prevent imminent or life-threatening deterioration.  Critical care was time spent personally by me on the following activities: development of treatment plan with patient and/or surrogate as well as nursing, discussions with consultants, evaluation of patient's response to treatment, examination of patient, obtaining history from patient or surrogate, ordering and performing treatments and interventions, ordering and review of laboratory studies, ordering and review of radiographic studies, pulse oximetry and re-evaluation of patient's condition.  INTUBATION Performed by: Joni Fears, Myndi Wamble  Required items: required blood products, implants, devices, and special equipment available Patient identity confirmed: provided demographic data and hospital-assigned identification number Time out: Immediately prior to procedure a "time out" was called to verify the correct patient, procedure, equipment, support staff and site/side marked as required.  Indications: Airway protection   Intubation method: Direct laryngoscopy Preoxygenation: 100% BVM. Patient was difficult to oxygenate with a peak oxygen level prior to intubation of 90%   Sedatives: Propofol  Paralytic: Rocuronium  Tube Size: 8-0 cuffed  Post-procedure assessment: chest rise and ETCO2 monitor Breath sounds: equal and absent over the epigastrium Tube secured  with: ETT holder Chest x-ray interpreted by radiologist and me.  Chest x-ray findings: endotracheal tube in appropriate position  Intubation was somewhat challenging. There were lots of secretions in the pharynx requiring suctioning. Initial intubation was attempted with kaleidoscope to minimize airway trauma, and it appeared that he was successfully intubated with this method, but despite symmetric breath sounds and no breath sounds in the stomach, oxygen saturation remained very low reaching a nadir of 33% . The tube was therefore removed, and the intubation was reattempted with direct laryngoscopy which provided a grade 1 view and easy intubation with symmetric breath sounds in the chest and no breath sounds over the stomach. During both attempts there was fogging the tube. Good color change.   No bradycardia or cardiac arrest during the procedure. On the vent, oxygen saturation reached 100% with initiation of PEEP.     ____________________________________________   INITIAL IMPRESSION / ASSESSMENT AND PLAN / ED COURSE  Pertinent labs & imaging results that were available during my care of the patient were reviewed by me and considered in my medical decision making (see chart for details).  Patient presents with status epilepticus, high blood pressure, concern for intracranial hemorrhage. Patient intubated on arrival due to critical illness and airway protection needed. We'll obtain CT head and labs and plan for ICU admission. OG-tube and Foley.  Clinical Course as of Apr 15 2313  Wed Apr 13, 2017  1543 Discussed with neurology Dr. Doy Mince, critical care doctor [place], and neurosurgery Dr. Cari Caraway. Consensus the patient is to be transferred to a neuro ICU. Dr. Doy Mince suggests going ahead and loading the patient with Dilantin. Discussed with family, they prefer [facility].  [PS]  1557 No DKA. Continue IVF. Noted low po2 on abg, but pt was agitated and fighting vent at the time of draw.  So2 is  95% on 50% fio2 when pt is calm. Pt's condition and care discussed with family at  length, they are fully updated.  Ketones, ur: NEGATIVE [PS]  9169 D/w [facility] transfer center, awaiting callback from neuro ICU  [PS]    Clinical Course User Index [PS] Carrie Mew, MD   Patient was accepted to neuro ICU, but they did note there would be a delay in having a bed assignment so they suggested contacting the medical ICU. I spoke with the medical ICU team and they did accept and had beds so the patient was ultimate transfer to the [facility] MICU.  ____________________________________________   FINAL CLINICAL IMPRESSION(S) / ED DIAGNOSES  Final diagnoses:  Status epilepticus ([place])  Hemorrhagic stroke ([place])  Hyperglycemia  Hyponatremia      New Prescriptions   No medications on file     Portions of this note were generated with dragon dictation software. Dictation errors may occur despite best attempts at proofreading.    Carrie Mew, MD 04/15/17 2315

## 2017-04-13 NOTE — ED Notes (Signed)
Called [facility] to inquire about transportation, informed needed to be RN to [employer], initiated process 1729. Asked why at 1640 was not RN to RN initiated, informed they had been busy.

## 2017-04-13 NOTE — ED Notes (Signed)
Family at bedside. 

## 2017-04-13 NOTE — ED Notes (Signed)
Called [facility] to inquire about transportation by [employer]

## 2017-04-13 NOTE — ED Notes (Signed)
ED Provider at bedside. 

## 2017-04-13 NOTE — ED Notes (Signed)
[employer] at bedside.

## 2017-04-13 NOTE — ED Notes (Signed)
PIV to right side infiltrated. Removed by this RN. Attempt start another PIV X2 by this RN unsuccessful. Dilantin stopped at this time due to only having 1 IV access.

## 2017-04-15 DIAGNOSIS — R569 Unspecified convulsions: Secondary | ICD-10-CM | POA: Insufficient documentation

## 2017-04-15 DIAGNOSIS — Z8673 Personal history of transient ischemic attack (TIA), and cerebral infarction without residual deficits: Secondary | ICD-10-CM | POA: Insufficient documentation

## 2017-04-15 DIAGNOSIS — G40909 Epilepsy, unspecified, not intractable, without status epilepticus: Secondary | ICD-10-CM | POA: Insufficient documentation

## 2017-04-15 LAB — URINE CULTURE: Culture: <10000 — AB

## 2017-04-19 ENCOUNTER — Telehealth: Payer: Self-pay

## 2017-04-19 NOTE — Telephone Encounter (Signed)
Patient is been discharged from [facility] and the scheduler did not know what is the reason for patient's stay or discharge. He said records will be faxed over. Appointment for hospital follow up was made for [facility]

## 2017-04-21 ENCOUNTER — Emergency Department: Payer: BLUE CROSS/BLUE SHIELD

## 2017-04-21 ENCOUNTER — Inpatient Hospital Stay
Admission: EM | Admit: 2017-04-21 | Discharge: 2017-04-25 | DRG: 371 | Disposition: A | Discharge status: Home Health | Payer: BLUE CROSS/BLUE SHIELD | Attending: Internal Medicine | Admitting: Internal Medicine

## 2017-04-21 ENCOUNTER — Telehealth: Payer: Self-pay | Admitting: Emergency Medicine

## 2017-04-21 ENCOUNTER — Encounter: Payer: Self-pay | Admitting: *Deleted

## 2017-04-21 DIAGNOSIS — Z833 Family history of diabetes mellitus: Secondary | ICD-10-CM

## 2017-04-21 DIAGNOSIS — R410 Disorientation, unspecified: Secondary | ICD-10-CM

## 2017-04-21 DIAGNOSIS — K659 Peritonitis, unspecified: Principal | ICD-10-CM | POA: Diagnosis present

## 2017-04-21 DIAGNOSIS — Z86718 Personal history of other venous thrombosis and embolism: Secondary | ICD-10-CM | POA: Diagnosis not present

## 2017-04-21 DIAGNOSIS — I10 Essential (primary) hypertension: Secondary | ICD-10-CM

## 2017-04-21 DIAGNOSIS — N186 End stage renal disease: Secondary | ICD-10-CM | POA: Diagnosis present

## 2017-04-21 DIAGNOSIS — I120 Hypertensive chronic kidney disease with stage 5 chronic kidney disease or end stage renal disease: Secondary | ICD-10-CM | POA: Diagnosis present

## 2017-04-21 DIAGNOSIS — K5090 Crohn's disease, unspecified, without complications: Secondary | ICD-10-CM | POA: Diagnosis present

## 2017-04-21 DIAGNOSIS — Z881 Allergy status to other antibiotic agents status: Secondary | ICD-10-CM

## 2017-04-21 DIAGNOSIS — E1165 Type 2 diabetes mellitus with hyperglycemia: Secondary | ICD-10-CM | POA: Diagnosis present

## 2017-04-21 DIAGNOSIS — T380X5A Adverse effect of glucocorticoids and synthetic analogues, initial encounter: Secondary | ICD-10-CM | POA: Diagnosis present

## 2017-04-21 DIAGNOSIS — R739 Hyperglycemia, unspecified: Secondary | ICD-10-CM

## 2017-04-21 DIAGNOSIS — Z8261 Family history of arthritis: Secondary | ICD-10-CM | POA: Diagnosis not present

## 2017-04-21 DIAGNOSIS — Z8673 Personal history of transient ischemic attack (TIA), and cerebral infarction without residual deficits: Secondary | ICD-10-CM

## 2017-04-21 DIAGNOSIS — D72829 Elevated white blood cell count, unspecified: Secondary | ICD-10-CM

## 2017-04-21 DIAGNOSIS — Z888 Allergy status to other drugs, medicaments and biological substances status: Secondary | ICD-10-CM | POA: Diagnosis not present

## 2017-04-21 DIAGNOSIS — N400 Enlarged prostate without lower urinary tract symptoms: Secondary | ICD-10-CM | POA: Diagnosis present

## 2017-04-21 DIAGNOSIS — Z91048 Other nonmedicinal substance allergy status: Secondary | ICD-10-CM | POA: Diagnosis not present

## 2017-04-21 DIAGNOSIS — Z8249 Family history of ischemic heart disease and other diseases of the circulatory system: Secondary | ICD-10-CM | POA: Diagnosis not present

## 2017-04-21 DIAGNOSIS — Z992 Dependence on renal dialysis: Secondary | ICD-10-CM | POA: Diagnosis not present

## 2017-04-21 DIAGNOSIS — Z794 Long term (current) use of insulin: Secondary | ICD-10-CM

## 2017-04-21 DIAGNOSIS — Z7982 Long term (current) use of aspirin: Secondary | ICD-10-CM | POA: Diagnosis not present

## 2017-04-21 DIAGNOSIS — J209 Acute bronchitis, unspecified: Secondary | ICD-10-CM | POA: Diagnosis present

## 2017-04-21 DIAGNOSIS — R1310 Dysphagia, unspecified: Secondary | ICD-10-CM | POA: Diagnosis present

## 2017-04-21 DIAGNOSIS — N2581 Secondary hyperparathyroidism of renal origin: Secondary | ICD-10-CM | POA: Diagnosis present

## 2017-04-21 DIAGNOSIS — I672 Cerebral atherosclerosis: Secondary | ICD-10-CM | POA: Diagnosis present

## 2017-04-21 DIAGNOSIS — E1122 Type 2 diabetes mellitus with diabetic chronic kidney disease: Secondary | ICD-10-CM | POA: Diagnosis present

## 2017-04-21 DIAGNOSIS — D631 Anemia in chronic kidney disease: Secondary | ICD-10-CM | POA: Diagnosis present

## 2017-04-21 DIAGNOSIS — J45909 Unspecified asthma, uncomplicated: Secondary | ICD-10-CM | POA: Diagnosis present

## 2017-04-21 DIAGNOSIS — R109 Unspecified abdominal pain: Secondary | ICD-10-CM

## 2017-04-21 HISTORY — DX: Nontraumatic intracerebral hemorrhage, unspecified: I61.9

## 2017-04-21 HISTORY — DX: Peritonitis, unspecified: K65.9

## 2017-04-21 LAB — CBC
HCT: 27.8 % — ABNORMAL LOW (ref 40.0–52.0)
Hemoglobin: 8.8 g/dL — ABNORMAL LOW (ref 13.0–18.0)
MCH: 26.5 pg (ref 26.0–34.0)
MCHC: 31.8 g/dL — ABNORMAL LOW (ref 32.0–36.0)
MCV: 83.5 fL (ref 80.0–100.0)
Platelets: 272 10*3/uL (ref 150–440)
RBC: 3.33 MIL/uL — ABNORMAL LOW (ref 4.40–5.90)
RDW: 17.9 % — ABNORMAL HIGH (ref 11.5–14.5)
WBC: 32.4 10*3/uL — ABNORMAL HIGH (ref 3.8–10.6)

## 2017-04-21 LAB — BASIC METABOLIC PANEL
Anion gap: 18 — ABNORMAL HIGH (ref 5–15)
BUN: 76 mg/dL — ABNORMAL HIGH (ref 6–20)
CO2: 24 mmol/L (ref 22–32)
Calcium: 8 mg/dL — ABNORMAL LOW (ref 8.9–10.3)
Chloride: 89 mmol/L — ABNORMAL LOW (ref 101–111)
Creatinine, Ser: 11.52 mg/dL — ABNORMAL HIGH (ref 0.61–1.24)
GFR calc Af Amer: 5 mL/min — ABNORMAL LOW (ref >60)
GFR calc non Af Amer: 4 mL/min — ABNORMAL LOW (ref >60)
Glucose, Bld: 481 mg/dL — ABNORMAL HIGH (ref 65–99)
Potassium: 3.4 mmol/L — ABNORMAL LOW (ref 3.5–5.1)
Sodium: 131 mmol/L — ABNORMAL LOW (ref 135–145)

## 2017-04-21 LAB — GLUCOSE, CAPILLARY
Glucose-Capillary: 409 mg/dL — ABNORMAL HIGH (ref 65–99)
Glucose-Capillary: 387 mg/dL — ABNORMAL HIGH (ref 65–99)
Glucose-Capillary: 439 mg/dL — ABNORMAL HIGH (ref 65–99)

## 2017-04-21 LAB — LACTIC ACID, PLASMA
Lactic Acid, Venous: 2.2 mmol/L (ref 0.5–1.9)
Lactic Acid, Venous: 1.6 mmol/L (ref 0.5–1.9)

## 2017-04-21 MED ORDER — INSULIN GLARGINE 100 UNIT/ML ~~LOC~~ SOLN
50.0000 [IU] | Freq: Every day | SUBCUTANEOUS | Status: DC
  Administered 2017-04-21: 50 [IU] via SUBCUTANEOUS
  Filled 2017-04-21 (×2): qty 0.5

## 2017-04-21 MED ORDER — ACETAMINOPHEN 650 MG RE SUPP: 650.0000 mg | Freq: Four times a day (QID) | RECTAL | Status: DC | PRN

## 2017-04-21 MED ORDER — ACETAMINOPHEN 500 MG PO TABS: 500.0000 mg | ORAL_TABLET | Freq: Four times a day (QID) | ORAL | Status: DC | PRN

## 2017-04-21 MED ORDER — CALCITRIOL 0.25 MCG PO CAPS
0.5000 ug | ORAL_CAPSULE | Freq: Two times a day (BID) | ORAL | Status: DC
  Administered 2017-04-21 – 2017-04-25 (×8): 0.5 ug via ORAL
  Filled 2017-04-21: qty 1
  Filled 2017-04-21 (×2): qty 2
  Filled 2017-04-21: qty 1
  Filled 2017-04-21 (×3): qty 2
  Filled 2017-04-21: qty 1
  Filled 2017-04-21: qty 2

## 2017-04-21 MED ORDER — INSULIN ASPART 100 UNIT/ML ~~LOC~~ SOLN
20.0000 [IU] | Freq: Once | SUBCUTANEOUS | Status: AC
  Administered 2017-04-21: 20 [IU] via SUBCUTANEOUS
  Filled 2017-04-21: qty 1

## 2017-04-21 MED ORDER — HEPARIN 1000 UNIT/ML FOR PERITONEAL DIALYSIS: 500.0000 [IU] | INTRAMUSCULAR | Status: DC | PRN

## 2017-04-21 MED ORDER — MEROPENEM-SODIUM CHLORIDE 500 MG/50ML IV SOLR
500.0000 mg | INTRAVENOUS | Status: DC
  Administered 2017-04-21 – 2017-04-24 (×4): 500 mg via INTRAVENOUS
  Filled 2017-04-21 (×6): qty 50

## 2017-04-21 MED ORDER — ACETAMINOPHEN 325 MG PO TABS
650.0000 mg | ORAL_TABLET | Freq: Four times a day (QID) | ORAL | Status: DC | PRN
  Administered 2017-04-21 – 2017-04-22 (×3): 650 mg via ORAL
  Filled 2017-04-21 (×3): qty 2

## 2017-04-21 MED ORDER — SODIUM BICARBONATE 650 MG PO TABS
650.0000 mg | ORAL_TABLET | Freq: Two times a day (BID) | ORAL | Status: DC
  Administered 2017-04-21 – 2017-04-25 (×8): 650 mg via ORAL
  Filled 2017-04-21 (×8): qty 1

## 2017-04-21 MED ORDER — SODIUM CHLORIDE 0.9 % IV SOLN
Freq: Once | INTRAVENOUS | Status: AC
  Administered 2017-04-21: 16:00:00 via INTRAVENOUS

## 2017-04-21 MED ORDER — FOLIC ACID 1 MG PO TABS
1.0000 mg | ORAL_TABLET | Freq: Every day | ORAL | Status: DC
  Administered 2017-04-21 – 2017-04-25 (×5): 1 mg via ORAL
  Filled 2017-04-21 (×5): qty 1

## 2017-04-21 MED ORDER — GLUCOSE 40 % PO GEL
ORAL | Status: AC
  Filled 2017-04-21: qty 1

## 2017-04-21 MED ORDER — INSULIN ASPART 100 UNIT/ML ~~LOC~~ SOLN
4.0000 [IU] | Freq: Three times a day (TID) | SUBCUTANEOUS | Status: DC
  Filled 2017-04-21 (×3): qty 1

## 2017-04-21 MED ORDER — PIPERACILLIN-TAZOBACTAM 3.375 G IVPB
3.3750 g | Freq: Once | INTRAVENOUS | Status: AC
  Administered 2017-04-21: 3.375 g via INTRAVENOUS
  Filled 2017-04-21: qty 50

## 2017-04-21 MED ORDER — GENTAMICIN SULFATE 0.1 % EX CREA
1.0000 "application " | TOPICAL_CREAM | Freq: Every day | CUTANEOUS | Status: DC
  Administered 2017-04-22 – 2017-04-24 (×4): 1 via TOPICAL
  Filled 2017-04-21: qty 15

## 2017-04-21 MED ORDER — CARVEDILOL 25 MG PO TABS
25.0000 mg | ORAL_TABLET | Freq: Two times a day (BID) | ORAL | Status: DC
  Administered 2017-04-21 – 2017-04-25 (×8): 25 mg via ORAL
  Filled 2017-04-21 (×8): qty 1

## 2017-04-21 MED ORDER — ONDANSETRON HCL 4 MG/2ML IJ SOLN: 4.0000 mg | Freq: Four times a day (QID) | INTRAMUSCULAR | Status: DC | PRN

## 2017-04-21 MED ORDER — AMLODIPINE BESYLATE 10 MG PO TABS
10.0000 mg | ORAL_TABLET | Freq: Every day | ORAL | Status: DC
  Administered 2017-04-22 – 2017-04-25 (×4): 10 mg via ORAL
  Filled 2017-04-21 (×4): qty 1

## 2017-04-21 MED ORDER — PRAVASTATIN SODIUM 20 MG PO TABS
40.0000 mg | ORAL_TABLET | Freq: Every day | ORAL | Status: DC
  Administered 2017-04-21 – 2017-04-25 (×5): 40 mg via ORAL
  Filled 2017-04-21 (×5): qty 2

## 2017-04-21 MED ORDER — FERROUS SULFATE 325 (65 FE) MG PO TABS
325.0000 mg | ORAL_TABLET | Freq: Two times a day (BID) | ORAL | Status: DC
  Administered 2017-04-22 – 2017-04-23 (×3): 325 mg via ORAL
  Filled 2017-04-21 (×3): qty 1

## 2017-04-21 MED ORDER — INSULIN ASPART 100 UNIT/ML ~~LOC~~ SOLN
0.0000 [IU] | Freq: Three times a day (TID) | SUBCUTANEOUS | Status: DC
  Administered 2017-04-22: 3 [IU] via SUBCUTANEOUS
  Administered 2017-04-22: 10 [IU] via SUBCUTANEOUS
  Administered 2017-04-22: 3 [IU] via SUBCUTANEOUS
  Administered 2017-04-23: 7 [IU] via SUBCUTANEOUS
  Administered 2017-04-23: 15 [IU] via SUBCUTANEOUS
  Administered 2017-04-23: 12 [IU] via SUBCUTANEOUS
  Administered 2017-04-24: 15 [IU] via SUBCUTANEOUS
  Administered 2017-04-24: 2 [IU] via SUBCUTANEOUS
  Administered 2017-04-25: 11 [IU] via SUBCUTANEOUS
  Filled 2017-04-21 (×9): qty 1

## 2017-04-21 MED ORDER — ONDANSETRON HCL 4 MG PO TABS: 4.0000 mg | ORAL_TABLET | Freq: Four times a day (QID) | ORAL | Status: DC | PRN

## 2017-04-21 MED ORDER — HYDROCODONE-ACETAMINOPHEN 5-325 MG PO TABS
1.0000 | ORAL_TABLET | ORAL | Status: DC | PRN
  Administered 2017-04-24 (×2): 2 via ORAL
  Filled 2017-04-21 (×2): qty 2

## 2017-04-21 NOTE — Telephone Encounter (Signed)
That's fine

## 2017-04-21 NOTE — ED Provider Notes (Signed)
[facility] Emergency Department Provider Note       Time seen: ----------------------------------------- 3:45 PM on 04/21/2017 -----------------------------------------     I have reviewed the triage vital signs and the nursing notes.   HISTORY   Chief Complaint Hyperglycemia    HPI [name] is a 56 y.o. male who presents to the ED for hyperglycemia started last night and persisted into this morning. Family reports blood sugars in the 500s at home. Patient reports she did not want his sugars to by mouth so he drank orange juice thinking he was hypoglycemic. Patient reports he took his diabetes medicines before leaving home to come in here. He denies fevers or chills, has had a cough and some diarrhea. He was recently admitted at [facility] for a hemorrhagic stroke. Patient states he is on peritoneal dialysis and recently his dialysis catheter was withdrawn a little bit. He did receive a dose of antibiotics via his peritoneal dialysis catheter.   Past Medical History:  Diagnosis Date  . Anemia   . Crohn disease ([place])   . Diabetes mellitus without complication ([place])   . DVT of lower extremity (deep venous thrombosis) ([place]) 2016  . Hidradenitis suppurativa   . Hypertension   . Renal disorder     Patient Active Problem List   Diagnosis Date Noted  . End stage renal failure on dialysis ([place]) 04/12/2016  . Aphthae 02/20/2016  . Hidradenitis 02/20/2016  . Leg pain 02/20/2016  . Microalbuminuria 02/20/2016  . Neuropathy 02/20/2016  . Chest pain 02/20/2016  . Pyogenic arthritis of knee ([place]) 02/04/2016  . Narrowing of intervertebral disc space 08/29/2015  . Vascular disorder of lower extremity 08/29/2015  . Failure of erection 08/29/2015  . Accumulation of fluid in tissues 08/29/2015  . Hypercholesteremia 08/29/2015  . BP (high blood pressure) 08/29/2015  . Anemia due to chronic kidney disease 05/26/2015  . Anemia in chronic illness 05/26/2015  .  Venous insufficiency of leg 09/04/2014  . Deep vein thrombosis of lower extremity ([place]) 08/16/2014  . Prostatic intraepithelial neoplasia 11/02/2013  . Abnormal prostate specific antigen 09/11/2013  . Elevated prostate specific antigen (PSA) 09/11/2013  . Benign prostatic hyperplasia with urinary obstruction 08/13/2013  . Spermatocele 08/13/2013  . CD (Crohn's disease) ([place]) 02/28/2013  . Chronic kidney disease, stage V ([place]) 01/25/2013  . Avitaminosis D 01/25/2013  . Abnormal presence of protein in urine 03/28/2012  . Diabetes ([place]) 03/28/2012  . Absolute anemia 08/03/2011  . Crohn's disease ([place]) 08/03/2011    Past Surgical History:  Procedure Laterality Date  . ABDOMINAL SURGERY    . KNEE SURGERY Left 02/04/2016   [facility]    Allergies Vancomycin; Methotrexate; and Tape  Social History Social History  Substance Use Topics  . Smoking status: Never Smoker  . Smokeless tobacco: Never Used  . Alcohol use No    Review of Systems Constitutional: Negative for fever. Eyes: Negative for vision changes ENT:  Negative for congestion, sore throat Cardiovascular: Negative for chest pain. Respiratory: Negative for shortness of breath.Positive for cough Gastrointestinal: Negative for abdominal pain, Positive for diarrhea Genitourinary: Negative for dysuria. Musculoskeletal: Negative for back pain. Skin: Negative for rash. Neurological: Positive for weakness  All systems negative/normal/unremarkable except as stated in the HPI  ____________________________________________   PHYSICAL EXAM:  VITAL SIGNS: ED Triage Vitals  Enc Vitals Group     BP 04/21/17 1422 (!) 90/49     Pulse Rate 04/21/17 1422 76     Resp 04/21/17 1422 16  Temp 04/21/17 1422 99.2 F (37.3 C)     Temp Source 04/21/17 1422 Oral     SpO2 04/21/17 1422 96 %     Weight 04/21/17 1423 226 lb (102.5 kg)     Height 04/21/17 1423 5' 11"  (1.803 m)     Head Circumference --      Peak Flow --      Pain Score  --      Pain Loc --      Pain Edu? --      Excl. in [place]? --     Constitutional: Alert and oriented. Well appearing and in no distress. Eyes: Conjunctivae are normal. Normal extraocular movements. ENT   Head: Normocephalic and atraumatic.   Nose: No congestion/rhinnorhea.   Mouth/Throat: Mucous membranes are moist.   Neck: No stridor. Cardiovascular: Normal rate, regular rhythm. No murmurs, rubs, or gallops. Respiratory: Normal respiratory effort without tachypnea nor retractions. Breath sounds are clear and equal bilaterally. No wheezes/rales/rhonchi. Gastrointestinal: Soft and nontender. Normal bowel sounds. Dialysis catheter is unremarkable, dialysate appears clear Musculoskeletal: Nontender with normal range of motion in extremities. No lower extremity tenderness nor edema. Neurologic:  Normal speech and language. No gross focal neurologic deficits are appreciated.  Skin:  Skin is warm, dry and intact. No rash noted. Psychiatric: Mood and affect are normal. Speech and behavior are normal.  ___________________________________________  ED COURSE:  Pertinent labs & imaging results that were available during my care of the patient were reviewed by me and considered in my medical decision making (see chart for details). Patient presents for hyperglycemia, we will assess with labs and imaging as indicated.   Procedures ____________________________________________   LABS (pertinent positives/negatives)  Labs Reviewed  BASIC METABOLIC PANEL - Abnormal; Notable for the following:       Result Value   Sodium 131 (*)    Potassium 3.4 (*)    Chloride 89 (*)    Glucose, Bld 481 (*)    BUN 76 (*)    Creatinine, Ser 11.52 (*)    Calcium 8.0 (*)    GFR calc non Af Amer 4 (*)    GFR calc Af Amer 5 (*)    Anion gap 18 (*)    All other components within normal limits  CBC - Abnormal; Notable for the following:    WBC 32.4 (*)    RBC 3.33 (*)    Hemoglobin 8.8 (*)    HCT  27.8 (*)    MCHC 31.8 (*)    RDW 17.9 (*)    All other components within normal limits  GLUCOSE, CAPILLARY - Abnormal; Notable for the following:    Glucose-Capillary 409 (*)    All other components within normal limits  LACTIC ACID, PLASMA - Abnormal; Notable for the following:    Lactic Acid, Venous 2.2 (*)    All other components within normal limits  CULTURE, BLOOD (ROUTINE X 2)  CULTURE, BLOOD (ROUTINE X 2)  GRAM STAIN  BODY FLUID CULTURE  URINALYSIS, COMPLETE (UACMP) WITH MICROSCOPIC  LACTIC ACID, PLASMA  CBG MONITORING, ED  CRITICAL CARE Performed by: Earleen Newport   Total critical care time: 30 minutes  Critical care time was exclusive of separately billable procedures and treating other patients.  Critical care was necessary to treat or prevent imminent or life-threatening deterioration.  Critical care was time spent personally by me on the following activities: development of treatment plan with patient and/or surrogate as well as nursing, discussions with consultants, evaluation of  patient's response to treatment, examination of patient, obtaining history from patient or surrogate, ordering and performing treatments and interventions, ordering and review of laboratory studies, ordering and review of radiographic studies, pulse oximetry and re-evaluation of patient's condition.   RADIOLOGY Images were viewed by me  Chest x-ray IMPRESSION: 1. The appearance of the chest suggests mild congestive heart failure, as above. CT head IMPRESSION: Re- demonstration of right frontal hypodensity, presumed to represent evolving subacute/ chronic infarction in this patient with given clinical history of prior stroke. The gyriform hyperdensity may represent evolving cortical necrosis/ mineralization, although superimposed small volume petechial hemorrhage not excluded. No evidence of subarachnoid hemorrhage or mass effect.  Intracranial  atherosclerosis. ____________________________________________  FINAL ASSESSMENT AND PLAN  Hyperglycemia, end-stage renal disease on peritoneal dialysis, leukocytosis  Plan: Patient's labs and imaging were dictated above. Patient had presented for hyperglycemia and was found to have marked leukocytosis. This is presumed to be secondary to infection which is likely causing the hyperglycemia as well. We have ordered broad-spectrum antibiotics. I discussed with nephrology on call. We will obtain a sample of his dialysate fluid. Patient has not produced any urine for [place] at this time. I will discuss with the hospitalist for admission.   Earleen Newport, MD   Note: This note was generated in part or whole with voice recognition software. Voice recognition is usually quite accurate but there are transcription errors that can and very often do occur. I apologize for any typographical errors that were not detected and corrected.     Earleen Newport, MD 04/21/17 [phone]

## 2017-04-21 NOTE — ED Triage Notes (Signed)
PT to ED with family reporting hyperglycemia last night and again this morning. Family reports CBG was 540 at home. Pt reports he did not want sugars to bottom out so he drank orange juice this morning. Pt denies other symptoms and denies pain other than a headache the he reports he has had since d/c from [facility] after a stroke last week. Pt reports he took diabetes medication before leaving home for ED. CBG at this time is 430.

## 2017-04-21 NOTE — Progress Notes (Signed)
Start of pd

## 2017-04-21 NOTE — H&P (Signed)
[place] [facility] [place] NAME: [name]    MR#:  [id number]  DATE OF BIRTH:  [birth year]  DATE OF ADMISSION:  04/21/2017  PRIMARY CARE PHYSICIAN: [name], MD   REQUESTING/REFERRING PHYSICIAN: Lenise Arena mD  CHIEF COMPLAINT:   Chief Complaint  Patient presents with  . Hyperglycemia    HISTORY OF PRESENT ILLNESS: [name]  is a 56 y.o. male with a known history of Diabetes type 2, Crohn's disease, end-stage renal disease on pericardial dialysis was actually hospitalized at [facility] and discharged last week after he was transferred there for intracranial hemorrhage which was conservatively treated with medical management. Patient apparently fell on Tuesday on his abdomen. So yesterday his paratonia or dialysis Or had to be adjusted at [facility] nephrology office. According to him. Now he is coming to the emergency room because his blood sugars are noted to be high at home. Patient's WBC count is noted to be significantly elevated. There is concern for peritonitis. Patient does complain of nausea and some vomiting and diarrhea but no abdominal pain. Did not have any fevers at home. Denies any chest pain or palpitations. Does complain of some chest congestion     PAST MEDICAL HISTORY:   Past Medical History:  Diagnosis Date  . Anemia   . Crohn disease ([place])   . Diabetes mellitus without complication ([place])   . DVT of lower extremity (deep venous thrombosis) ([place]) 2016  . Hidradenitis suppurativa   . Hypertension   . ICH (intracerebral hemorrhage) ([place])   . Renal disorder     PAST SURGICAL HISTORY: Past Surgical History:  Procedure Laterality Date  . ABDOMINAL SURGERY    . KNEE SURGERY Left 02/04/2016   [facility]    SOCIAL HISTORY:  Social History  Substance Use Topics  . Smoking status: Never Smoker  . Smokeless tobacco: Never Used  . Alcohol use No    FAMILY HISTORY:  Family History  Problem Relation Age of Onset  . Irritable  bowel syndrome Sister   . Diabetes Sister   . Heart disease Mother   . Diabetes Mother   . Heart disease Father   . Rheumatic fever Father        as child  . Psoriasis Brother   . Arthritis Brother   . Diabetes Sister   . Diabetes Sister     DRUG ALLERGIES:  Allergies  Allergen Reactions  . Vancomycin Shortness Of Breath    Eyes watering, SOB, wheezing  . Methotrexate Other (See Comments)    Blood count drops  . Tape     REVIEW OF SYSTEMS:   CONSTITUTIONAL: No fever, fatigue or weakness.  EYES: No blurred or double vision.  EARS, NOSE, AND THROAT: No tinnitus or ear pain.  RESPIRATORY: No cough, shortness of breath, wheezing or hemoptysis.  CARDIOVASCULAR: No chest pain, orthopnea, edema.  GASTROINTESTINAL: Positive nausea, vomiting, diarrhea or abdominal pain.  GENITOURINARY: No dysuria, hematuria.  ENDOCRINE: No polyuria, nocturia,  HEMATOLOGY: No anemia, easy bruising or bleeding SKIN: No rash or lesion. MUSCULOSKELETAL: No joint pain or arthritis.   NEUROLOGIC: No tingling, numbness, weakness.  PSYCHIATRY: No anxiety or depression.   MEDICATIONS AT HOME:  Prior to Admission medications   Medication Sig Start Date End Date Taking? Authorizing Provider  acetaminophen (TYLENOL) 500 MG tablet Take 500 mg by mouth every 6 (six) hours as needed.   Yes [provider]  amLODipine (NORVASC) 10 MG tablet TAKE ONE TABLET BY  MOUTH ONCE DAILY 08/29/15  Yes Margarita Rana, MD  aspirin EC 81 MG tablet Take 81 mg by mouth daily.   Yes [provider]  atorvastatin (LIPITOR) 80 MG tablet Take 80 mg by mouth daily. 04/20/17  Yes [provider]  calcitRIOL (ROCALTROL) 0.25 MCG capsule Take 0.5 mcg by mouth daily.  11/02/15  Yes [provider]  carvedilol (COREG) 25 MG tablet Take 25 mg by mouth 2 (two) times daily.  11/02/15  Yes [provider]  furosemide (LASIX) 80 MG tablet Take 80 mg by mouth 2 (two) times daily.   Yes [provider]  inFLIXimab (REMICADE) 100 MG injection Inject into the vein every 6 (six) weeks.    Yes [provider]  insulin aspart (NOVOLOG FLEXPEN) 100 UNIT/ML FlexPen Take 8-15 units three times a day before each meal Patient taking differently: Inject 6 Units into the skin 3 (three) times daily with meals.  08/18/16  Yes [name], MD  LANTUS SOLOSTAR 100 UNIT/ML Solostar Pen INJECT 45 UNITS SUBCUTANEOUSLY ONCE DAILY -  INCREASE  BY  1  UNIT  DAILY  UNTIL  SUGARS  ARE  IMPROVED Patient taking differently: INJECT 18 UNITS SUBCUTANEOUSLY QHS 12/09/16  Yes [name], MD  levETIRAcetam (KEPPRA) 1000 MG tablet Take 1,000 mg by mouth at bedtime.  04/20/17  Yes [provider]  lisinopril (PRINIVIL,ZESTRIL) 40 MG tablet Take 40 mg by mouth daily. 04/20/17  Yes [provider]  sildenafil (VIAGRA) 100 MG tablet Take 1 tablet (100 mg total) by mouth daily as needed. 04/27/16  Yes [name], MD  sodium bicarbonate 650 MG tablet Take 650 mg by mouth 2 (two) times daily.   Yes [provider]  glucose blood (BAYER CONTOUR TEST) test strip Check sugar three times daily for insulin dependent diabetes 12/10/16   [name], MD      PHYSICAL EXAMINATION:   VITAL SIGNS: Blood pressure 123/61, pulse 79, temperature 99.2 F (37.3 C), temperature source Oral, resp. rate 17, height 5' 11"  (1.803 m), weight 226 lb (102.5 kg), SpO2 93 %.  GENERAL:  56 y.o.-year-old patient lying in the bed with no acute distress.  EYES: Pupils equal, round, reactive to light and accommodation. No scleral icterus. Extraocular muscles intact.  HEENT: Head atraumatic, normocephalic. Oropharynx and nasopharynx clear.  NECK:  Supple, no jugular venous distention. No thyroid enlargement, no tenderness.  LUNGS: Normal breath sounds bilaterally, no wheezing, rales,rhonchi or crepitation. No use of accessory muscles of respiration.  CARDIOVASCULAR: S1, S2 normal. No murmurs,  rubs, or gallops.  ABDOMEN: Soft, nontender, nondistended. Bowel sounds present. No organomegaly or mass. peritonial dialyses cathter  appears intact no drainage noted EXTREMITIES: No pedal edema, cyanosis, or clubbing.  NEUROLOGIC: Cranial nerves II through XII are intact. Muscle strength 5/5 in all extremities. Sensation intact. Gait not checked.  PSYCHIATRIC: The patient is alert and oriented x 3.  SKIN: No obvious rash, lesion, or ulcer.   LABORATORY PANEL:   CBC  Recent Labs Lab 04/21/17 1422  WBC 32.4*  HGB 8.8*  HCT 27.8*  PLT 272  MCV 83.5  MCH 26.5  MCHC 31.8*  RDW 17.9*   ------------------------------------------------------------------------------------------------------------------  Chemistries   Recent Labs Lab 04/21/17 1422  NA 131*  K 3.4*  CL 89*  CO2 24  GLUCOSE 481*  BUN 76*  CREATININE 11.52*  CALCIUM 8.0*   ------------------------------------------------------------------------------------------------------------------ estimated creatinine clearance is 8.9 mL/min (A) (by C-G formula based on SCr  of 11.52 mg/dL (H)). ------------------------------------------------------------------------------------------------------------------ No results for input(s): TSH, T4TOTAL, T3FREE, THYROIDAB in the last 72 hours.  Invalid input(s): FREET3   Coagulation profile No results for input(s): INR, PROTIME in the last 168 hours. ------------------------------------------------------------------------------------------------------------------- No results for input(s): DDIMER in the last 72 hours. -------------------------------------------------------------------------------------------------------------------  Cardiac Enzymes No results for input(s): CKMB, TROPONINI, MYOGLOBIN in the last 168 hours.  Invalid input(s): CK ------------------------------------------------------------------------------------------------------------------ Invalid input(s):  POCBNP  ---------------------------------------------------------------------------------------------------------------  Urinalysis    Component Value Date/Time   COLORURINE YELLOW (A) 04/13/2017 1455   APPEARANCEUR HAZY (A) 04/13/2017 1455   LABSPEC 1.016 04/13/2017 1455   PHURINE 6.0 04/13/2017 1455   GLUCOSEU >=500 (A) 04/13/2017 1455   HGBUR MODERATE (A) 04/13/2017 1455   BILIRUBINUR NEGATIVE 04/13/2017 1455   KETONESUR NEGATIVE 04/13/2017 1455   PROTEINUR 100 (A) 04/13/2017 1455   NITRITE NEGATIVE 04/13/2017 1455   LEUKOCYTESUR NEGATIVE 04/13/2017 1455     RADIOLOGY: Dg Chest 2 View  Result Date: 04/21/2017 CLINICAL DATA:  56 year old male with history of hyperglycemia and vomiting. Diarrhea earlier days. EXAM: CHEST  2 VIEW COMPARISON:  Chest x-ray 04/13/2017. FINDINGS: There is cephalization of the pulmonary vasculature and slight indistinctness of the interstitial markings suggestive of mild pulmonary edema. Trace bilateral pleural effusions. Mild cardiomegaly. Upper mediastinal contours are within normal limits. Aortic atherosclerosis. IMPRESSION: 1. The appearance of the chest suggests mild congestive heart failure, as above. Electronically Signed   By: Vinnie Langton M.D.   On: 04/21/2017 15:41   Ct Head Wo Contrast  Result Date: 04/21/2017 CLINICAL DATA:  56 year old male with a history of prior stroke with admission at [place]. Headache EXAM: CT HEAD WITHOUT CONTRAST TECHNIQUE: Contiguous axial images were obtained from the base of the skull through the vertex without intravenous contrast. COMPARISON:  04/13/2017, 11/06/2015 FINDINGS: Brain: Re- demonstration of confluent hypodensity in the right frontal lobe in this patient with known prior stroke. Gyriform hyperdensity with configuration of the frontal gyration. No definite evidence of acute subarachnoid hemorrhage, subdural hemorrhage, or epidural hemorrhage. No local mass effect. Unchanged configuration of the  ventricles. Remainder of the gray-white differentiation maintained. Vascular: Calcifications of the anterior circulation. Skull: No displaced fracture. Sinuses/Orbits: Unremarkable Other: None IMPRESSION: Re- demonstration of right frontal hypodensity, presumed to represent evolving subacute/ chronic infarction in this patient with given clinical history of prior stroke. The gyriform hyperdensity may represent evolving cortical necrosis/ mineralization, although superimposed small volume petechial hemorrhage not excluded. No evidence of subarachnoid hemorrhage or mass effect. Intracranial atherosclerosis. Electronically Signed   By: Corrie Mckusick D.O.   On: 04/21/2017 16:29    EKG: Orders placed or performed during the hospital encounter of 04/13/17  . ED EKG  . ED EKG    IMPRESSION AND PLAN: Patient is a 56 year old African-American male who is on peritonial  dialysis presents with hyperglycemia  1. Acute. Peritonitis likely related to the PD catheter Fluid sample has been sent for culture Continue meropenem for now Nephrology has been notified Depending on fluid Evaluation may need the catheter out  2. Hyperglycemia likely related to underlying infection I will increase his Lantus dose at nighttime as well as place him on a high sensitivity sliding scale We will also place him on pre-meal insulin schedule  3. End-stage renal disease further therapy per nephrology  4. Essential hypertension we'll continue amlodipine  5. Misc: heparin dvt proph   All the records are reviewed and case discussed with ED provider. Management plans discussed with the patient, family and they are  in agreement.  CODE STATUS: Code Status History    Date Active Date Inactive Code Status Order ID Comments User Context   11/06/2015  8:35 PM 11/07/2015  6:18 PM Full Code [id number]  Aldean Jewett, MD ED       TOTAL TIME TAKING CARE OF THIS PATIENT:55 minutes.    Dustin Flock M.D on 04/21/2017 at  6:10 PM  Between 7am to 6pm - Pager - [phone]  After 6pm go to [email or web address] - password [email or web address] [place] [facility]  Office  [phone]  CC: Primary care physician; [name], MD

## 2017-04-21 NOTE — ED Notes (Signed)
Patient unable to provide urine sample at this time

## 2017-04-21 NOTE — Progress Notes (Signed)
PD start. Pt alert, no c/o. Dressing change. Pre PD weight was 102.2 kg.

## 2017-04-21 NOTE — Progress Notes (Signed)
Pharmacist - Prescriber Communication  Meropenem dose modified from 1 gm IV Q8H to 500 mg IV Q24H due to peritoneal dialysis and CrCl < 10 mL/min.  Vanya Carberry A. [place], [place].D., BCPS Clinical Pharmacist 04/21/2017 [phone]

## 2017-04-21 NOTE — ED Notes (Signed)
Patient transported to CT 

## 2017-04-21 NOTE — ED Notes (Signed)
Patient transported to X-ray 

## 2017-04-21 NOTE — Telephone Encounter (Signed)
[name] with home health nursing wants verbal orders for nursing once a week for 7 weeks and twice a week for 2 weeks. Also verbal order to continue OT and PT and speech therapy. Please advise.   Sharyn Lull [phone]. She states that she has a secure voicemail and can leave this on her VM.

## 2017-04-21 NOTE — Progress Notes (Addendum)
per pharmacy place order for insulin 20 units 1 time as this is what sliding scale coverage should be and  It is not set to start until tomorrow morning.

## 2017-04-21 NOTE — Progress Notes (Signed)
Per Dr. Posey Pronto okay to just give 1 time dose of 20 units as sliding scale and not 4 units for meal coverage. Will place 1 time order for 20 units.

## 2017-04-21 NOTE — Telephone Encounter (Signed)
[employer] as below.

## 2017-04-21 NOTE — ED Notes (Signed)
FIRST NURSE NOTE-per daughter sugars at home over 500 and getting higher. On insulin, has been taking.  Has had vomiting/diarrhea.

## 2017-04-22 ENCOUNTER — Inpatient Hospital Stay: Payer: BLUE CROSS/BLUE SHIELD

## 2017-04-22 LAB — URINALYSIS, COMPLETE (UACMP) WITH MICROSCOPIC
Bilirubin Urine: NEGATIVE
Glucose, UA: >=500 mg/dL — AB
Ketones, ur: NEGATIVE mg/dL
Nitrite: NEGATIVE
Protein, ur: 100 mg/dL — AB
Specific Gravity, Urine: 1.017 (ref 1.005–1.030)
pH: 5 (ref 5.0–8.0)

## 2017-04-22 LAB — CBC
HCT: 27.2 % — ABNORMAL LOW (ref 40.0–52.0)
HCT: 27.9 % — ABNORMAL LOW (ref 40.0–52.0)
Hemoglobin: 8.9 g/dL — ABNORMAL LOW (ref 13.0–18.0)
Hemoglobin: 8.9 g/dL — ABNORMAL LOW (ref 13.0–18.0)
MCH: 27.2 pg (ref 26.0–34.0)
MCH: 26.3 pg (ref 26.0–34.0)
MCHC: 32.8 g/dL (ref 32.0–36.0)
MCHC: 31.8 g/dL — ABNORMAL LOW (ref 32.0–36.0)
MCV: 83 fL (ref 80.0–100.0)
MCV: 82.7 fL (ref 80.0–100.0)
Platelets: 267 10*3/uL (ref 150–440)
Platelets: 294 10*3/uL (ref 150–440)
RBC: 3.28 MIL/uL — ABNORMAL LOW (ref 4.40–5.90)
RBC: 3.37 MIL/uL — ABNORMAL LOW (ref 4.40–5.90)
RDW: 17.8 % — ABNORMAL HIGH (ref 11.5–14.5)
RDW: 17.9 % — ABNORMAL HIGH (ref 11.5–14.5)
WBC: 28.1 10*3/uL — ABNORMAL HIGH (ref 3.8–10.6)
WBC: 25 10*3/uL — ABNORMAL HIGH (ref 3.8–10.6)

## 2017-04-22 LAB — BASIC METABOLIC PANEL
Anion gap: 16 — ABNORMAL HIGH (ref 5–15)
BUN: 75 mg/dL — ABNORMAL HIGH (ref 6–20)
CO2: 25 mmol/L (ref 22–32)
Calcium: 8.4 mg/dL — ABNORMAL LOW (ref 8.9–10.3)
Chloride: 92 mmol/L — ABNORMAL LOW (ref 101–111)
Creatinine, Ser: 10.56 mg/dL — ABNORMAL HIGH (ref 0.61–1.24)
GFR calc Af Amer: 6 mL/min — ABNORMAL LOW (ref >60)
GFR calc non Af Amer: 5 mL/min — ABNORMAL LOW (ref >60)
Glucose, Bld: 505 mg/dL (ref 65–99)
Potassium: 3.3 mmol/L — ABNORMAL LOW (ref 3.5–5.1)
Sodium: 133 mmol/L — ABNORMAL LOW (ref 135–145)

## 2017-04-22 LAB — GLUCOSE, CAPILLARY
Glucose-Capillary: 450 mg/dL — ABNORMAL HIGH (ref 65–99)
Glucose-Capillary: 453 mg/dL — ABNORMAL HIGH (ref 65–99)
Glucose-Capillary: 472 mg/dL — ABNORMAL HIGH (ref 65–99)
Glucose-Capillary: 231 mg/dL — ABNORMAL HIGH (ref 65–99)
Glucose-Capillary: 145 mg/dL — ABNORMAL HIGH (ref 65–99)
Glucose-Capillary: 135 mg/dL — ABNORMAL HIGH (ref 65–99)
Glucose-Capillary: 140 mg/dL — ABNORMAL HIGH (ref 65–99)
Glucose-Capillary: 190 mg/dL — ABNORMAL HIGH (ref 65–99)

## 2017-04-22 LAB — HEMOGLOBIN A1C
Hgb A1c MFr Bld: 10.1 % — ABNORMAL HIGH (ref 4.8–5.6)
Mean Plasma Glucose: 243 mg/dL

## 2017-04-22 MED ORDER — IPRATROPIUM-ALBUTEROL 0.5-2.5 (3) MG/3ML IN SOLN
3.0000 mL | RESPIRATORY_TRACT | Status: DC | PRN
  Administered 2017-04-23: 3 mL via RESPIRATORY_TRACT
  Filled 2017-04-22: qty 3

## 2017-04-22 MED ORDER — INSULIN GLARGINE 100 UNIT/ML ~~LOC~~ SOLN
30.0000 [IU] | Freq: Every day | SUBCUTANEOUS | Status: DC
  Administered 2017-04-22: 30 [IU] via SUBCUTANEOUS
  Filled 2017-04-22 (×2): qty 0.3

## 2017-04-22 MED ORDER — IPRATROPIUM-ALBUTEROL 0.5-2.5 (3) MG/3ML IN SOLN
3.0000 mL | RESPIRATORY_TRACT | Status: DC
  Administered 2017-04-22: 3 mL via RESPIRATORY_TRACT
  Filled 2017-04-22: qty 3

## 2017-04-22 MED ORDER — INSULIN ASPART 100 UNIT/ML ~~LOC~~ SOLN
24.0000 [IU] | Freq: Once | SUBCUTANEOUS | Status: AC
  Administered 2017-04-22: 24 [IU] via SUBCUTANEOUS
  Filled 2017-04-22: qty 1

## 2017-04-22 MED ORDER — METHYLPREDNISOLONE SODIUM SUCC 40 MG IJ SOLR
40.0000 mg | Freq: Every day | INTRAMUSCULAR | Status: DC
  Administered 2017-04-22 – 2017-04-24 (×3): 40 mg via INTRAVENOUS
  Filled 2017-04-22 (×3): qty 1

## 2017-04-22 MED ORDER — PANTOPRAZOLE SODIUM 40 MG IV SOLR
40.0000 mg | Freq: Two times a day (BID) | INTRAVENOUS | Status: DC
  Administered 2017-04-22 – 2017-04-25 (×7): 40 mg via INTRAVENOUS
  Filled 2017-04-22 (×7): qty 40

## 2017-04-22 NOTE — Progress Notes (Signed)
Patient has audible wheezing and being tachypniec. MD was notified and order received.

## 2017-04-22 NOTE — Progress Notes (Addendum)
[place] [facility] [place] NAME: [name]    MR#:  [id number]  DATE OF BIRTH:  09/03/1961  SUBJECTIVE: Patient is seen at bedside, admitted for fall, found to to have infection and possible peritonitis. Wife thinks that he has throat pain and difficulty swallowing and she does not think he has peritonitis. Patient has no abdominal pain.  Low Grade temp. Was at [facility] recently. Was intubated that time. Since then after extubation he is been having throat pain, difficulty swallowing, burning in the chest. He also has some cough unable to get the phlegm and when he coughs he is having pain in his abdomen. Her sugars have been in the range of 500s last night, now it's better.  was just hospitalized at [facility] from 6/27 to 7/3 for hemorrhagic conversion of ischemic stroke.   Patient went home when his dialysis catheter was contaminated. Patient then went to the dialysis center on Wednesday 7/4 where he was given empiric ceftazidime and cephalexin. He had a new catheter placed.   He started to develop elevated glucose levels, subjective chills and abdominal pain and presented to [facility].       CHIEF COMPLAINT:   Chief Complaint  Patient presents with  . Hyperglycemia    REVIEW OF SYSTEMS:    Review of Systems  Constitutional: Negative for chills and fever.  HENT: Negative for hearing loss and sore throat.   Eyes: Negative for blurred vision, double vision and photophobia.  Respiratory: Positive for cough and sputum production. Negative for hemoptysis and shortness of breath.   Cardiovascular: Negative for palpitations, orthopnea and leg swelling.  Gastrointestinal: Positive for heartburn. Negative for abdominal pain, diarrhea and vomiting.  Genitourinary: Negative for dysuria and urgency.  Musculoskeletal: Negative for myalgias and neck pain.  Skin: Negative for rash.  Neurological: Negative for dizziness, focal weakness, seizures, weakness and  headaches.  Psychiatric/Behavioral: Negative for memory loss. The patient does not have insomnia.     Nutrition:  Tolerating Diet: Tolerating PT:      DRUG ALLERGIES:   Allergies  Allergen Reactions  . Vancomycin Shortness Of Breath    Eyes watering, SOB, wheezing  . Methotrexate Other (See Comments)    Blood count drops  . Tape     VITALS:  Blood pressure (!) 101/59, pulse 79, temperature 99.5 F (37.5 C), temperature source Oral, resp. rate (!) 0, height 5' 11"  (1.803 m), weight 101.3 kg (223 lb 4.8 oz), SpO2 97 %.  PHYSICAL EXAMINATION:   Physical Exam  GENERAL:  56 y.o.-year-old patient lying in the bed with no acute distress.  EYES: Pupils equal, round, reactive to light and accommodation. No scleral icterus. Extraocular muscles intact.  HEENT: Head atraumatic, normocephalic. Oropharynx and nasopharynx clear.  NECK:  Supple, no jugular venous distention. No thyroid enlargement, no tenderness.  LUNGS: Normal breath sounds bilaterally, no wheezing, rales,rhonchi or crepitation. No use of accessory muscles of respiration.  CARDIOVASCULAR: S1, S2 normal. No murmurs, rubs, or gallops.  ABDOMEN: Soft, nontender, nondistended. Bowel sounds present. No organomegaly or mass. Peritoneal catheter site is clean. EXTREMITIES: No pedal edema, cyanosis, or clubbing.  NEUROLOGIC: Cranial nerves II through XII are intact. Muscle strength 5/5 in all extremities. Sensation intact. Gait not checked.  PSYCHIATRIC: The patient is alert and oriented x 3.  SKIN: No obvious rash, lesion, or ulcer.    LABORATORY PANEL:   CBC  Recent Labs Lab 04/22/17 0546  WBC 28.1*  HGB 8.9*  HCT  27.2*  PLT 267   ------------------------------------------------------------------------------------------------------------------  Chemistries   Recent Labs Lab 04/22/17 0546  NA 133*  K 3.3*  CL 92*  CO2 25  GLUCOSE 505*  BUN 75*  CREATININE 10.56*  CALCIUM 8.4*    ------------------------------------------------------------------------------------------------------------------  Cardiac Enzymes No results for input(s): TROPONINI in the last 168 hours. ------------------------------------------------------------------------------------------------------------------  RADIOLOGY:  Dg Chest 2 View  Result Date: 04/21/2017 CLINICAL DATA:  56 year old male with history of hyperglycemia and vomiting. Diarrhea earlier days. EXAM: CHEST  2 VIEW COMPARISON:  Chest x-ray 04/13/2017. FINDINGS: There is cephalization of the pulmonary vasculature and slight indistinctness of the interstitial markings suggestive of mild pulmonary edema. Trace bilateral pleural effusions. Mild cardiomegaly. Upper mediastinal contours are within normal limits. Aortic atherosclerosis. IMPRESSION: 1. The appearance of the chest suggests mild congestive heart failure, as above. Electronically Signed   By: Vinnie Langton M.D.   On: 04/21/2017 15:41   Ct Head Wo Contrast  Result Date: 04/21/2017 CLINICAL DATA:  56 year old male with a history of prior stroke with admission at [facility]. Headache EXAM: CT HEAD WITHOUT CONTRAST TECHNIQUE: Contiguous axial images were obtained from the base of the skull through the vertex without intravenous contrast. COMPARISON:  04/13/2017, 11/06/2015 FINDINGS: Brain: Re- demonstration of confluent hypodensity in the right frontal lobe in this patient with known prior stroke. Gyriform hyperdensity with configuration of the frontal gyration. No definite evidence of acute subarachnoid hemorrhage, subdural hemorrhage, or epidural hemorrhage. No local mass effect. Unchanged configuration of the ventricles. Remainder of the gray-white differentiation maintained. Vascular: Calcifications of the anterior circulation. Skull: No displaced fracture. Sinuses/Orbits: Unremarkable Other: None IMPRESSION: Re- demonstration of right frontal hypodensity, presumed to represent  evolving subacute/ chronic infarction in this patient with given clinical history of prior stroke. The gyriform hyperdensity may represent evolving cortical necrosis/ mineralization, although superimposed small volume petechial hemorrhage not excluded. No evidence of subarachnoid hemorrhage or mass effect. Intracranial atherosclerosis. Electronically Signed   By: Corrie Mckusick D.O.   On: 04/21/2017 16:29     ASSESSMENT AND PLAN:   Active Problems:   Peritonitis ([place])  #1 history of fall, leukocytosis likely due to acute bronchitis and possible peritonitis. Patient has no abdominal pain, no purulent discharge from peritoneal catheter site, wife complains that he is been having throat pain, cough, unable to follow. Continue empiric antibiotics, follow cultures, nephrology consult for his need for peritoneal dialysis. Peritoneal catheter site is clean without evidence of infection. 2 diabetes mellitus type 2: Uncontrolled. Blood glucose more than 500 at times. Received a morning dose of Lantus, extra dose of NovoLog, no blood sugar is better in 200 range. No evidence of DKA. #3 history of Crohn's disease,; patient is on Remicade, denies any issues with diarrhea or abdominal pain.   4.history of right frontal lobe stroke: Small petechial hemorrhages by CAT scan. He is alert, awake, oriented.  NO AC,PT to follow D/w wife,RN  All the records are reviewed and case discussed with Care Management/Social Workerr. Management plans discussed with the patient, family and they are in agreement.  CODE STATUS:full  TOTAL TIME TAKING CARE OF THIS PATIENT: 77mnutes.   POSSIBLE D/C IN 1-2 DAYS, DEPENDING ON CLINICAL CONDITION.   KEpifanio LeschesM.D on 04/22/2017 at 1:05 PM  Between 7am to 6pm - Pager - [phone]  After 6pm go to [email or web address] - password [email or web address]  Office  [phone] CC: Primary care physician; FBirdie Sons MD

## 2017-04-22 NOTE — Progress Notes (Signed)
Lab informed of critical glucose of 505, pt stable, denies any distress. Dr. Estanislado Pandy notified, new orders for insulin received. Will monitor response.

## 2017-04-22 NOTE — Progress Notes (Signed)
PD COMPLETED

## 2017-04-22 NOTE — Evaluation (Signed)
Clinical/Bedside Swallow Evaluation Patient Details  Name: [name] MRN: [id number] Date of Birth: 07/02/1961  Today's Date: 04/22/2017 Time: SLP Start Time (ACUTE ONLY): 1400 SLP Stop Time (ACUTE ONLY): 1500 SLP Time Calculation (min) (ACUTE ONLY): 60 min  Past Medical History:  Past Medical History:  Diagnosis Date  . Anemia   . Crohn disease ([place])   . Diabetes mellitus without complication ([place])   . DVT of lower extremity (deep venous thrombosis) ([place]) 2016  . Hidradenitis suppurativa   . Hypertension   . ICH (intracerebral hemorrhage) ([place])   . Renal disorder    Past Surgical History:  Past Surgical History:  Procedure Laterality Date  . ABDOMINAL SURGERY    . KNEE SURGERY Left 02/04/2016   [facility]   HPI:  Pt  is a 56 y.o. male with a known history of multiple medical issues and dxs including IHC ~2 weeks ago, seizure activity(oral intubation/extubation during that time), diabetes type 2, Crohn's disease, end-stage renal disease on pericardial dialysis. Pt was actually hospitalized at [facility] ~6-7 days and discharged last week after he was transferred there for intracranial hemorrhage which was conservatively treated with medical management. His paratonia dialysis had to be adjusted at [facility] nephrology office according to him.  Now he is coming to the emergency room because his blood sugars are noted to be high at home. Patient's WBC count is noted to be significantly elevated. There is concern for peritonitis. Patient does complain of nausea and some vomiting and diarrhea but no abdominal pain. Did not have any fevers at home. Denies any chest pain or palpitations. Pt c/o throat pain when swallowing since extubation. Wife stated pt has not been sleeping or eating well since coming home from [facility].    Assessment / Plan / Recommendation Clinical Impression  This was a somewhat limited evaluation d/t pt having to leave the room for a Head CT (d/t concern for change of status). Pt presented  w/ slight-min drowiness but was fully awake to hold cup for drinking independently. Pt consumed few trials of ice chips and thin liquids via cup w/ no overt s/s of aspiration noted; no decline in respiratory status or change in vocal quality and timely oral phase management. Pt then consumed few tsp trials of puree w/ no overt s/s of aspiration noted, however, min increased oral phase time for bolus management and A-P transfer. Pt appeared to be hesitant in initiating bolus propulsion and the pharyngeal swallowing w/ puree trials - unsure if related to a physiological impact or related to his odynophagia (discomfort/pain) when swallowing. Wife and pt stated he has experienced this discomfort when swallowing since shortly after his oral extubation at [facility]. Due to the dx of IHC(R frontal) and oropharyngeal phase dysphagia w/ odynophagia present, recommend a modified diet to a Dysphagia level 2 w/ thin liquids w/ aspiration precautions, Pills in Puree(ice cream) for easier swallowing, and monitoring at meals for s/s of aspiration. Recommend a Dietician consult for the decreased appetite and overall intake for possible need for nutritional supplement (a drink form would be best at this time). NSG updated, Wife agreed. ST services would like to f/u w/ pt for further toleration of diet d/t the limited po trials assessed at this evaluation today d/t leaving for head CT. In addition, would recommend an ENT consult if pt continues to experience odynophagia (and d/t recent oral intubation/extubation). SLP Visit Diagnosis: Dysphagia, oropharyngeal phase (R13.12)    Aspiration Risk   (reduced following precautions)  Diet Recommendation  Dysphagia level 2 w/ Thin liquids; aspiration precautions; monitoring at meals for support  Medication Administration: Whole meds with puree (or Crushed if needed for easier swallowing)    Other  Recommendations Recommended Consults:  (Dietician consult) Oral Care Recommendations:  Oral care BID;Staff/trained caregiver to provide oral care;Patient independent with oral care; ENT consult as determined necessary  Follow up Recommendations Home health SLP (TBD)      Frequency and Duration min 3x week  1 week       Prognosis Prognosis for Safe Diet Advancement: Fair (-Good) Barriers/Prognosis Comment: premorbid deficits      Swallow Study   General Date of Onset: 04/21/17 HPI: Pt  is a 56 y.o. male with a known history of multiple medical issues and dxs including IHC ~2 weeks ago, seizure activity(oral intubation/extubation during that time), diabetes type 2, Crohn's disease, end-stage renal disease on pericardial dialysis. Pt was actually hospitalized at [facility] ~6-7 days and discharged last week after he was transferred there for intracranial hemorrhage which was conservatively treated with medical management. His paratonia dialysis had to be adjusted at [facility] nephrology office according to him.  Now he is coming to the emergency room because his blood sugars are noted to be high at home. Patient's WBC count is noted to be significantly elevated. There is concern for peritonitis. Patient does complain of nausea and some vomiting and diarrhea but no abdominal pain. Did not have any fevers at home. Denies any chest pain or palpitations. Pt c/o throat pain when swallowing since extubation. Wife stated pt has not been sleeping or eating well since coming home from [facility].  Type of Study: Bedside Swallow Evaluation Previous Swallow Assessment: pt stated he had a "swallowing test" while at [facility].  Diet Prior to this Study: Regular;Thin liquids Temperature Spikes Noted: Yes (temp 100; wbc 28.1) Respiratory Status: Room air History of Recent Intubation: No (not this admit; intubated 6.27.2018 for "few days") Behavior/Cognition: Alert;Cooperative;Pleasant mood;Distractible;Requires cueing (awake but min drowsy; verbally responsive given time) Oral Cavity Assessment: Dry (sticky) Oral Care  Completed by SLP: Recent completion by staff Oral Cavity - Dentition: Adequate natural dentition Vision: Functional for self-feeding Self-Feeding Abilities: Able to feed self;Needs assist;Needs set up Patient Positioning: Upright in chair Baseline Vocal Quality: Low vocal intensity (min pain(recent extubation)) Volitional Cough: Strong Volitional Swallow: Able to elicit    Oral/Motor/Sensory Function Overall Oral Motor/Sensory Function: Within functional limits   Ice Chips Ice chips: Within functional limits Presentation: Spoon (fed; 3 trials)   Thin Liquid Thin Liquid: Within functional limits Presentation: Cup;Self Fed (4 trials) Other Comments: pt had to leave for head CT    Nectar Thick Nectar Thick Liquid: Not tested   Honey Thick Honey Thick Liquid: Not tested   Puree Puree: Impaired Presentation: Self Fed;Spoon (assisted; 2 trials) Oral Phase Impairments:  (slower oral phase management) Oral Phase Functional Implications: Prolonged oral transit Pharyngeal Phase Impairments:  (none) Other Comments: pt appeared hesitant when swallowing d/t discomfort   Solid   GO   Solid: Not tested Other Comments: pt had to leave for head CT         Orinda Kenner, MS, CCC-SLP Kittie Krizan 04/22/2017,3:46 PM

## 2017-04-22 NOTE — Consult Note (Signed)
[facility]  CONSULT NOTE    Date: 04/22/2017                  Patient Name:  [name]  MRN: [id number]  DOB: 15-Sep-1961  Age / Sex: 56 y.o., male         PCP: [name], MD                 Service Requesting Consult: Dr. Vianne Bulls                 Reason for Consult: End Stage Renal Disease            History of Present Illness: [name] is a 56 y.o. black male with End Stage Renal Disease on peritoneal dialysis, hypertension, insulin dependent diabetes mellitus type II, history of intracerebral hemorrhage, history fo DVT, crohn's disease on infliximab, seizure disorder who was admitted to [facility] on 04/21/2017 for Hyperglycemia [R73.9] End-stage renal disease on peritoneal dialysis ([place]) [N18.6, Z99.2] Leukocytosis, unspecified type [D72.829]  Patient was just hospitalized at [facility] from 6/27 to 7/3 for hemorrhagic conversion of ischemic stroke.   Patient went home when his dialysis catheter was contaminated. Patient then went to the dialysis center on Wednesday 7/4 where he was given empiric ceftazidime and cephalexin. He had a new catheter placed.   He started to develop elevated glucose levels, subjective chills and abdominal pain and presented to [facility].   Peritoneal dialysis last night. Tolerated treatment well.   CCPD 9 hours 4 exchanges 2.5 L fills with no last fill. 2.5% dextrose.    Medications: Outpatient medications: Prescriptions Prior to Admission  Medication Sig Dispense Refill Last Dose  . acetaminophen (TYLENOL) 500 MG tablet Take 500 mg by mouth every 6 (six) hours as needed.   prn at prn  . amLODipine (NORVASC) 10 MG tablet TAKE ONE TABLET BY MOUTH ONCE DAILY 30 tablet 0 04/21/2017 at am  . aspirin EC 81 MG tablet Take 81 mg by mouth daily.   04/21/2017 at am  . atorvastatin (LIPITOR) 80 MG tablet Take 80 mg by mouth daily.  0 04/21/2017 at am  . calcitRIOL (ROCALTROL) 0.25 MCG capsule Take 0.5 mcg by mouth daily.   1 04/21/2017  at am  . carvedilol (COREG) 25 MG tablet Take 25 mg by mouth 2 (two) times daily.   3 04/21/2017 at am  . furosemide (LASIX) 80 MG tablet Take 80 mg by mouth 2 (two) times daily.   04/21/2017 at am  . inFLIXimab (REMICADE) 100 MG injection Inject into the vein every 6 (six) weeks.    Taking  . insulin aspart (NOVOLOG FLEXPEN) 100 UNIT/ML FlexPen Take 8-15 units three times a day before each meal (Patient taking differently: Inject 6 Units into the skin 3 (three) times daily with meals. ) 1 pen 3 04/21/2017 at Unknown time  . LANTUS SOLOSTAR 100 UNIT/ML Solostar Pen INJECT 45 UNITS SUBCUTANEOUSLY ONCE DAILY -  INCREASE  BY  1  UNIT  DAILY  UNTIL  SUGARS  ARE  IMPROVED (Patient taking differently: INJECT 18 UNITS SUBCUTANEOUSLY QHS) 5 pen 3 04/20/2017 at qhs  . levETIRAcetam (KEPPRA) 1000 MG tablet Take 1,000 mg by mouth at bedtime.   0 04/20/2017 at qhs  . lisinopril (PRINIVIL,ZESTRIL) 40 MG tablet Take 40 mg by mouth daily.  0 04/21/2017 at am  . sildenafil (VIAGRA) 100 MG tablet Take 1 tablet (100 mg total) by mouth daily as needed. 6  tablet 1 prn at prn  . sodium bicarbonate 650 MG tablet Take 650 mg by mouth 2 (two) times daily.   04/21/2017 at am  . glucose blood (BAYER CONTOUR TEST) test strip Check sugar three times daily for insulin dependent diabetes 100 each 12     Current medications: Current Facility-Administered Medications  Medication Dose Route Frequency Provider Last Rate Last Dose  . acetaminophen (TYLENOL) tablet 650 mg  650 mg Oral Q6H PRN Dustin Flock, MD   650 mg at 04/22/17 1220   Or  . acetaminophen (TYLENOL) suppository 650 mg  650 mg Rectal Q6H PRN Dustin Flock, MD      . amLODipine (NORVASC) tablet 10 mg  10 mg Oral Daily Dustin Flock, MD   10 mg at 04/22/17 1038  . calcitRIOL (ROCALTROL) capsule 0.5 mcg  0.5 mcg Oral BID Dustin Flock, MD   0.5 mcg at 04/22/17 1220  . carvedilol (COREG) tablet 25 mg  25 mg Oral BID Dustin Flock, MD   25 mg at 04/22/17 1038  . ferrous  sulfate tablet 325 mg  325 mg Oral BID WC Dustin Flock, MD   325 mg at 04/22/17 0820  . folic acid (FOLVITE) tablet 1 mg  1 mg Oral Daily Dustin Flock, MD   1 mg at 04/22/17 1038  . gentamicin cream (GARAMYCIN) 0.1 % 1 application  1 application Topical Daily Ishi Danser, MD      . HYDROcodone-acetaminophen (NORCO/VICODIN) 5-325 MG per tablet 1-2 tablet  1-2 tablet Oral Q4H PRN Dustin Flock, MD      . insulin aspart (novoLOG) injection 0-20 Units  0-20 Units Subcutaneous TID WC Dustin Flock, MD   10 Units at 04/22/17 0820  . insulin aspart (novoLOG) injection 4 Units  4 Units Subcutaneous TID WC Dustin Flock, MD      . insulin glargine (LANTUS) injection 30 Units  30 Units Subcutaneous Q2200 Epifanio Lesches, MD      . meropenem (MERREM) IVPB SOLR 500 mg  500 mg Intravenous Q24H Earleen [place], MD   Stopped at 04/21/17 2306  . ondansetron (ZOFRAN) tablet 4 mg  4 mg Oral Q6H PRN Dustin Flock, MD       Or  . ondansetron (ZOFRAN) injection 4 mg  4 mg Intravenous Q6H PRN Dustin Flock, MD      . pantoprazole (PROTONIX) injection 40 mg  40 mg Intravenous Q12H Epifanio Lesches, MD      . pravastatin (PRAVACHOL) tablet 40 mg  40 mg Oral Daily Dustin Flock, MD   40 mg at 04/22/17 1038  . sodium bicarbonate tablet 650 mg  650 mg Oral BID Dustin Flock, MD   650 mg at 04/22/17 1038      Allergies: Allergies  Allergen Reactions  . Vancomycin Shortness Of Breath    Eyes watering, SOB, wheezing  . Methotrexate Other (See Comments)    Blood count drops  . Tape       Past Medical History: Past Medical History:  Diagnosis Date  . Anemia   . Crohn disease ([place])   . Diabetes mellitus without complication ([place])   . DVT of lower extremity (deep venous thrombosis) ([place]) 2016  . Hidradenitis suppurativa   . Hypertension   . ICH (intracerebral hemorrhage) ([place])   . Renal disorder      Past Surgical History: Past Surgical History:  Procedure Laterality  Date  . ABDOMINAL SURGERY    . KNEE SURGERY Left 02/04/2016   [facility]  Family History: Family History  Problem Relation Age of Onset  . Irritable bowel syndrome Sister   . Diabetes Sister   . Heart disease Mother   . Diabetes Mother   . Heart disease Father   . Rheumatic fever Father        as child  . Psoriasis Brother   . Arthritis Brother   . Diabetes Sister   . Diabetes Sister      Social History: Social History   Social History  . Marital status: Married    Spouse name: N/A  . Number of children: N/A  . Years of education: N/A   Occupational History  . Not on file.   Social History Main Topics  . Smoking status: Never Smoker  . Smokeless tobacco: Never Used  . Alcohol use No  . Drug use: Yes    Types: Marijuana  . Sexual activity: Not on file   Other Topics Concern  . Not on file   Social History Narrative  . No narrative on file     Review of Systems: Review of Systems  Constitutional: Negative.  Negative for chills, diaphoresis, fever, malaise/fatigue and weight loss.  HENT: Negative.  Negative for congestion, ear discharge, ear pain, hearing loss, nosebleeds, sinus pain, sore throat and tinnitus.   Eyes: Negative.  Negative for blurred vision, double vision, photophobia, pain, discharge and redness.  Respiratory: Positive for shortness of breath and wheezing. Negative for cough, hemoptysis, sputum production and stridor.   Cardiovascular: Positive for leg swelling. Negative for chest pain, palpitations, orthopnea, claudication and PND.  Gastrointestinal: Positive for abdominal pain and constipation. Negative for blood in stool, diarrhea, heartburn, melena, nausea and vomiting.  Genitourinary: Negative.  Negative for dysuria, flank pain, frequency, hematuria and urgency.  Musculoskeletal: Negative.  Negative for back pain, falls, joint pain, myalgias and neck pain.  Skin: Negative.  Negative for itching and rash.  Neurological: Negative.  Negative  for dizziness, tingling, tremors, sensory change, speech change, focal weakness, seizures, loss of consciousness, weakness and headaches.  Endo/Heme/Allergies: Negative.  Negative for environmental allergies and polydipsia. Does not bruise/bleed easily.  Psychiatric/Behavioral: Negative.  Negative for depression, hallucinations, memory loss, substance abuse and suicidal ideas. The patient is not nervous/anxious and does not have insomnia.     Vital Signs: Blood pressure (!) 101/59, pulse 79, temperature 99.5 F (37.5 C), temperature source Oral, resp. rate (!) 0, height 5\' 11"  (1.803 m), weight 101.3 kg (223 lb 4.8 oz), SpO2 97 %.  Weight trends: Filed Weights   04/21/17 1423 04/22/17 0602  Weight: 102.5 kg (226 lb) 101.3 kg (223 lb 4.8 oz)    Physical Exam: General: NAD, sitting in chair  Head: Normocephalic, atraumatic. Moist oral mucosal membranes  Eyes: Anicteric, PERRL  Neck: Supple, trachea midline  Lungs:  Clear to auscultation  Heart: Regular rate and rhythm  Abdomen:  Soft, nontender,   Extremities: Trace bilatera peripheral edema.  Neurologic: Nonfocal, moving all four extremities  Skin: No lesions  Access: PD catheter, exit site clean     Lab results: Basic Metabolic Panel:  Recent Labs Lab 04/21/17 1422 04/22/17 0546  NA 131* 133*  K 3.4* 3.3*  CL 89* 92*  CO2 24 25  GLUCOSE 481* 505*  BUN 76* 75*  CREATININE 11.52* 10.56*  CALCIUM 8.0* 8.4*    Liver Function Tests: No results for input(s): AST, ALT, ALKPHOS, BILITOT, PROT, ALBUMIN in the last 168 hours. No results for input(s): LIPASE, AMYLASE in the last 168 hours.  No results for input(s): AMMONIA in the last 168 hours.  CBC:  Recent Labs Lab 04/21/17 1422 04/22/17 0546  WBC 32.4* 28.1*  HGB 8.8* 8.9*  HCT 27.8* 27.2*  MCV 83.5 83.0  PLT 272 267    Cardiac Enzymes: No results for input(s): CKTOTAL, CKMB, CKMBINDEX, TROPONINI in the last 168 hours.  BNP: Invalid input(s):  POCBNP  CBG:  Recent Labs Lab 04/22/17 0744 04/22/17 0745 04/22/17 0802 04/22/17 1025 04/22/17 1204  GLUCAP 472* 450* 453* 231* 145*    Microbiology: Results for orders placed or performed during the hospital encounter of 04/21/17  Blood culture (routine x 2)     Status: None (Preliminary result)   Collection Time: 04/21/17  3:43 PM  Result Value Ref Range Status   Specimen Description BLOOD BLOOD LEFT HAND  Final   Special Requests   Final    BOTTLES DRAWN AEROBIC AND ANAEROBIC Blood Culture results may not be optimal due to an excessive volume of blood received in culture bottles   Culture NO GROWTH < 24 HOURS  Final   Report Status PENDING  Incomplete  Blood culture (routine x 2)     Status: None (Preliminary result)   Collection Time: 04/21/17  3:43 PM  Result Value Ref Range Status   Specimen Description BLOOD RIGHT ANTECUBITAL  Final   Special Requests   Final    BOTTLES DRAWN AEROBIC AND ANAEROBIC Blood Culture results may not be optimal due to an excessive volume of blood received in culture bottles   Culture NO GROWTH < 24 HOURS  Final   Report Status PENDING  Incomplete    Coagulation Studies: No results for input(s): LABPROT, INR in the last 72 hours.  Urinalysis:  Recent Labs  04/21/17 1422  COLORURINE YELLOW*  LABSPEC 1.017  PHURINE 5.0  GLUCOSEU >=500*  HGBUR SMALL*  BILIRUBINUR NEGATIVE  KETONESUR NEGATIVE  PROTEINUR 100*  NITRITE NEGATIVE  LEUKOCYTESUR SMALL*      Imaging: Dg Chest 2 View  Result Date: 04/21/2017 CLINICAL DATA:  56 year old male with history of hyperglycemia and vomiting. Diarrhea earlier days. EXAM: CHEST  2 VIEW COMPARISON:  Chest x-ray 04/13/2017. FINDINGS: There is cephalization of the pulmonary vasculature and slight indistinctness of the interstitial markings suggestive of mild pulmonary edema. Trace bilateral pleural effusions. Mild cardiomegaly. Upper mediastinal contours are within normal limits. Aortic  atherosclerosis. IMPRESSION: 1. The appearance of the chest suggests mild congestive heart failure, as above. Electronically Signed   By: Vinnie Langton M.D.   On: 04/21/2017 15:41   Ct Head Wo Contrast  Result Date: 04/21/2017 CLINICAL DATA:  56 year old male with a history of prior stroke with admission at [facility]. Headache EXAM: CT HEAD WITHOUT CONTRAST TECHNIQUE: Contiguous axial images were obtained from the base of the skull through the vertex without intravenous contrast. COMPARISON:  04/13/2017, 11/06/2015 FINDINGS: Brain: Re- demonstration of confluent hypodensity in the right frontal lobe in this patient with known prior stroke. Gyriform hyperdensity with configuration of the frontal gyration. No definite evidence of acute subarachnoid hemorrhage, subdural hemorrhage, or epidural hemorrhage. No local mass effect. Unchanged configuration of the ventricles. Remainder of the gray-white differentiation maintained. Vascular: Calcifications of the anterior circulation. Skull: No displaced fracture. Sinuses/Orbits: Unremarkable Other: None IMPRESSION: Re- demonstration of right frontal hypodensity, presumed to represent evolving subacute/ chronic infarction in this patient with given clinical history of prior stroke. The gyriform hyperdensity may represent evolving cortical necrosis/ mineralization, although superimposed small volume petechial hemorrhage not excluded. No evidence of  subarachnoid hemorrhage or mass effect. Intracranial atherosclerosis. Electronically Signed   By: Corrie Mckusick D.O.   On: 04/21/2017 16:29      Assessment & Plan: Mr. Jaquell Seddon Graber is a 56 y.o. black male with End Stage Renal Disease on peritoneal dialysis, hypertension, insulin dependent diabetes mellitus type II, history of intracerebral hemorrhage, history fo DVT, crohn's disease on infliximab, seizure disorder who was admitted to [facility] on 04/21/2017 for Hyperglycemia [R73.9] End-stage renal disease on peritoneal  dialysis ([place]) [N18.6, Z99.2] Leukocytosis, unspecified type [D72.829]  Peritoneal Dialysis Prescription CCPD 9 hours 2.5 litres 4 exchanges. No last fill  [facility]   1. End Stage Renal Disease:  On peritoneal dialysis - Tolerated peritoneal dialysis last night. Orders prepared for tonight - on sodium bicarbonate  2. Abdominal pain: peritonitis versus constipation:  - check abdominal ultrasound - empiric antibiotics - pending cell count and cultures.   3. Hypertension: hypotensive this morning  - carvedilol  4. Diabetes mellitus type II with chronic kidney disease: insulin dependent. Not well controlled. Hemoglobin A1c 10.2% on 04/13/17  5. Anemia of chronic kidney disease: Hemoglobin 8.9 - consult neurology to see if patient may receive erythropoietin due to recent hemorrhagic stroke.   LOS: 1 Takeela Peil 7/6/201812:54 PM

## 2017-04-22 NOTE — Progress Notes (Addendum)
Inpatient Diabetes Program Recommendations  AACE/ADA: New Consensus Statement on Inpatient Glycemic Control (2015)  Target Ranges:  Prepandial:   less than 140 mg/dL      Peak postprandial:   less than 180 mg/dL (1-2 hours)      Critically ill patients:  140 - 180 mg/dL   Results for [name], [name] (MRN [id number]) as of 04/22/2017 09:31  Ref. Range 11/18/2016 10:18 04/21/2017 14:22  Hemoglobin A1C Latest Ref Range: 4.8 - 5.6 % 6.5 (H) 10.1 (H)   Results for [name], [name] (MRN [id number]) as of 04/22/2017 09:31  Ref. Range 04/21/2017 14:21 04/21/2017 18:15 04/21/2017 20:29 04/22/2017 07:44 04/22/2017 07:45 04/22/2017 08:02  Glucose-Capillary Latest Ref Range: 65 - 99 mg/dL 409 (H) 387 (H)  20 units Novolog 439 (H)  50 units Lantus 472 (H)  24 units Novolog 450 (H) 453 (H)  10 units Novolog    Admit with: Hyperglycemia/ Acute Peritonitis   History: DM, ESRD, ICH  Home DM Meds: Lantus 18 units QHS (per recent discharge summary from [facility]- Was taking Lantus 45 units daily per PCP notes)       Novolog 6 units TID (per recent discharge summary from [facility]- Was taking 8-15 units TID wit meals per PCP notes)  Current Insulin Orders: Lantus 50 units QHS      Novolog Resistant Correction Scale/ SSI (0-20 units) TID AC      Novolog 4 units TID with meals      -Note patient recently discharged from [facility] (06/27 through 07/03) for [place] treatment.  -Per review of Care Everywhere, pt's Lantus and Novolog insulins were reduced at time of discharge from the hospital.  Was taking Lantus 45 units daily + Novolog 8-15 units TID with meals prior to being hospitalized at [facility].  -Hyperglycemic on admission yesterday (glucose 481 mg/dl yesterday at 2pm).  -Note patient's A1c has worsened since [place] when pt saw his PCP (Dr. Caryn Section with [facility]).   Addendum 1:30pm- Spoke with pt earlier this morning with wife present at bedside.  Discussed pt's current A1c of 10.1%  and how this is increased from the last value of 6.5% back in February (drawn at PCP visit).  Reminded patient that his goal A1c is 7% or less per ADA standards to prevent both acute and long-term complications.  Encouraged patient to check his CBGs TID AC at home and to record all CBGs in a logbook for his PCP to review.  Has hospital follow-up visit with his PCP scheduled for 04/29/17 at 11am.  Discussed with pt and wife that we will be checking his CBGs QID in hospital and that we will be adjusting his insulins to keep his CBGs under control.  Encouraged pt to ask for additional CBG checks if needed if he has any symptoms for low CBGs.  Pt nor wife had any questions for me about his DM at this time.       --Will follow patient during hospitalization--  Wyn Quaker RN, MSN, CDE Diabetes Coordinator Inpatient Glycemic Control Team Team Pager: [phone] (8a-5p)

## 2017-04-22 NOTE — Progress Notes (Signed)
PD STARTED

## 2017-04-23 ENCOUNTER — Inpatient Hospital Stay: Payer: BLUE CROSS/BLUE SHIELD

## 2017-04-23 DIAGNOSIS — R410 Disorientation, unspecified: Secondary | ICD-10-CM

## 2017-04-23 LAB — GLUCOSE, CAPILLARY
Glucose-Capillary: 466 mg/dL — ABNORMAL HIGH (ref 65–99)
Glucose-Capillary: 468 mg/dL — ABNORMAL HIGH (ref 65–99)
Glucose-Capillary: 341 mg/dL — ABNORMAL HIGH (ref 65–99)
Glucose-Capillary: 217 mg/dL — ABNORMAL HIGH (ref 65–99)
Glucose-Capillary: 216 mg/dL — ABNORMAL HIGH (ref 65–99)

## 2017-04-23 LAB — HIV ANTIBODY (ROUTINE TESTING W REFLEX): HIV Screen 4th Generation wRfx: NONREACTIVE

## 2017-04-23 MED ORDER — INSULIN GLARGINE 100 UNIT/ML ~~LOC~~ SOLN
40.0000 [IU] | Freq: Every day | SUBCUTANEOUS | Status: DC
  Administered 2017-04-23 – 2017-04-24 (×2): 40 [IU] via SUBCUTANEOUS
  Filled 2017-04-23 (×3): qty 0.4

## 2017-04-23 MED ORDER — NYSTATIN 100000 UNIT/ML MT SUSP
5.0000 mL | Freq: Four times a day (QID) | OROMUCOSAL | Status: DC
  Administered 2017-04-23 – 2017-04-25 (×9): 500000 [IU] via OROMUCOSAL
  Filled 2017-04-23 (×9): qty 5

## 2017-04-23 MED ORDER — GUAIFENESIN-DM 100-10 MG/5ML PO SYRP
5.0000 mL | ORAL_SOLUTION | ORAL | Status: DC | PRN
  Administered 2017-04-23 – 2017-04-24 (×3): 5 mL via ORAL
  Filled 2017-04-23 (×3): qty 5

## 2017-04-23 MED ORDER — LACTULOSE 10 GM/15ML PO SOLN
30.0000 g | Freq: Once | ORAL | Status: AC
  Administered 2017-04-23: 30 g via ORAL
  Filled 2017-04-23: qty 60

## 2017-04-23 MED ORDER — FERRIC CITRATE 1 GM 210 MG(FE) PO TABS
420.0000 mg | ORAL_TABLET | Freq: Three times a day (TID) | ORAL | Status: DC
Start: 2017-04-23 — End: 2017-04-25
  Administered 2017-04-23 – 2017-04-25 (×7): 420 mg via ORAL
  Filled 2017-04-23 (×8): qty 2

## 2017-04-23 MED ORDER — INSULIN ASPART 100 UNIT/ML ~~LOC~~ SOLN
6.0000 [IU] | Freq: Three times a day (TID) | SUBCUTANEOUS | Status: DC
  Administered 2017-04-23 – 2017-04-25 (×5): 6 [IU] via SUBCUTANEOUS
  Filled 2017-04-23 (×5): qty 1

## 2017-04-23 NOTE — Evaluation (Signed)
Physical Therapy Evaluation Patient Details Name: [name] MRN: [id number] DOB: Jan 25, 1961 Today's Date: 04/23/2017   History of Present Illness  56 yo male had ischemic stroke and admitted for hemorrhagic progression noted after falling, had HD cath contamination and replaced this week.  Was intubated in [facility] after stroke, now cough and sore throat.  Uncontrolled DM blamed for progression of stroke.  Admitted to [facility] for elevated BS, concern for peritonitis and on aspiration precautions.  PMHx:  stroke, DM, DVT, HTN,   Clinical Impression  Pt is up to walk with assistance and noted his RW is safe with minor help.  He is too unsteady and requires too much help to walk alone, and talked with he and wife about HHPT and progressing to outpatient as he is released from home care. WIll follow acutely for same goals, strengthening and increasing standing and gait balance.     Follow Up Recommendations Home health PT;Supervision for mobility/OOB    Equipment Recommendations  None recommended by PT    Recommendations for Other Services       Precautions / Restrictions Precautions Precautions: Fall Restrictions Weight Bearing Restrictions: No      Mobility  Bed Mobility Overal bed mobility: Needs Assistance Bed Mobility: Supine to Sit     Supine to sit: Min guard;Min assist     General bed mobility comments: minor help to sit up and then can control sitting balance  Transfers Overall transfer level: Needs assistance Equipment used: Rolling walker (2 wheeled);1 person hand held assist Transfers: Sit to/from Stand Sit to Stand: From elevated surface;Min assist;Mod assist (cued hands)         General transfer comment: assistance to power up   Ambulation/Gait Ambulation/Gait assistance: Min guard;Min assist Ambulation Distance (Feet): 20 Feet Assistive device: Rolling walker (2 wheeled) Gait Pattern/deviations: Step-through pattern;Decreased stride length;Wide base of  support;Trunk flexed;Shuffle Gait velocity: reduced Gait velocity interpretation: Below normal speed for age/gender General Gait Details: minor help but includes initiating changes of direction for pt  Stairs            Wheelchair Mobility    Modified Rankin (Stroke Patients Only) Modified Rankin (Stroke Patients Only) Pre-Morbid Rankin Score: Moderate disability Modified Rankin: Moderately severe disability     Balance Overall balance assessment: Needs assistance Sitting-balance support: Feet supported Sitting balance-Leahy Scale: Fair   Postural control: Posterior lean Standing balance support: Bilateral upper extremity supported Standing balance-Leahy Scale: Poor                               Pertinent Vitals/Pain Pain Assessment: No/denies pain    Home Living Family/patient expects to be discharged to:: Private residence Living Arrangements: Spouse/significant other;Children Available Help at Discharge: Family;Available 24 hours/day Type of Home: House Home Access: Stairs to enter Entrance Stairs-Rails: Right;Left;Can reach both Entrance Stairs-Number of Steps: 4 Home Layout: One level Home Equipment: Environmental consultant - 2 wheels;Crutches      Prior Function Level of Independence: Needs assistance   Gait / Transfers Assistance Needed: RW with some supervision at home after dc from [facility]  ADL's / Homemaking Assistance Needed: wife is caring for home while pt recovers        Hand Dominance        Extremity/Trunk Assessment   Upper Extremity Assessment Upper Extremity Assessment: Generalized weakness    Lower Extremity Assessment Lower Extremity Assessment: Generalized weakness    Cervical / Trunk Assessment Cervical / Trunk  Assessment: Normal  Communication   Communication: No difficulties  Cognition Arousal/Alertness: Awake/alert Behavior During Therapy: Flat affect Overall Cognitive Status: Within Functional Limits for tasks assessed                                         General Comments General comments (skin integrity, edema, etc.): O2 sats were low with the effort, noted his start number was 92% and ending 93%    Exercises     Assessment/Plan    PT Assessment Patient needs continued PT services  PT Problem List Decreased strength;Decreased range of motion;Decreased activity tolerance;Decreased balance;Decreased mobility;Decreased coordination;Decreased cognition;Decreased knowledge of use of DME;Decreased safety awareness;Decreased knowledge of precautions       PT Treatment Interventions DME instruction;Gait training;Functional mobility training;Stair training;Therapeutic activities;Therapeutic exercise;Balance training;Neuromuscular re-education;Patient/family education    PT Goals (Current goals can be found in the Care Plan section)  Acute Rehab PT Goals Patient Stated Goal: to get up to walk PT Goal Formulation: With patient/family Time For Goal Achievement: 05/07/17 Potential to Achieve Goals: Good    Frequency 7X/week   Barriers to discharge Inaccessible home environment has stairs to enter house, needs to practice here    Co-evaluation               AM-PAC PT "6 Clicks" Daily Activity  Outcome Measure Difficulty turning over in bed (including adjusting bedclothes, sheets and blankets)?: Total Difficulty moving from lying on back to sitting on the side of the bed? : Total Difficulty sitting down on and standing up from a chair with arms (e.g., wheelchair, bedside commode, etc,.)?: Total Help needed moving to and from a bed to chair (including a wheelchair)?: A Little Help needed walking in hospital room?: A Little Help needed climbing 3-5 steps with a railing? : A Lot 6 Click Score: 11    End of Session Equipment Utilized During Treatment: Gait belt Activity Tolerance: Patient tolerated treatment well;Patient limited by fatigue Patient left: in chair;with call bell/phone  within reach;with chair alarm set;with family/visitor present Nurse Communication: Mobility status PT Visit Diagnosis: Hemiplegia and hemiparesis;Difficulty in walking, not elsewhere classified (R26.2);Unsteadiness on feet (R26.81) Hemiplegia - Right/Left: Left Hemiplegia - dominant/non-dominant: Non-dominant Hemiplegia - caused by: Nontraumatic intracerebral hemorrhage    Time: 1325-1347 PT Time Calculation (min) (ACUTE ONLY): 22 min   Charges:   PT Evaluation $PT Eval Moderate Complexity: 1 Procedure     PT G Codes:   PT G-Codes **NOT FOR INPATIENT CLASS** Functional Assessment Tool Used: AM-PAC 6 Clicks Basic Mobility    Ramond Dial 04/23/2017, 2:54 PM   Mee Hives, PT MS Acute Rehab Dept. Number: [place] and [place]

## 2017-04-23 NOTE — Progress Notes (Signed)
PD Started  

## 2017-04-23 NOTE — Progress Notes (Signed)
PT Cancellation Note  Patient Details Name: [name] MRN: [id number] DOB: 08-Feb-1961   Cancelled Treatment:    Reason Eval/Treat Not Completed: Patient at procedure or test/unavailable.  Will try to see later after [place] is done.  Family in room waiting for pt.   Ramond Dial 04/23/2017, 12:25 PM   Mee Hives, PT MS Acute Rehab Dept. Number: [place]

## 2017-04-23 NOTE — Progress Notes (Signed)
SLP Cancellation Note  Patient Details Name: [name] MRN: [id number] DOB: 1961-02-06   Cancelled treatment:       Reason Eval/Treat Not Completed: Patient at procedure or test/unavailable (chart reviewed; consulted pt/wife in room. Pt NPO)  Pt is currently NPO for abd test today. Pt and wife stated he tolerated his dysphagia diet last evening w/ no overt s/s of aspiration occurring. Pt is experiencing increased phlegm and spitting often; no decline in respiratory status noted and not on any O2 support - suspect could be GI phlegm related issues? MD consulted re: pt's status this morning and agreed w/ SLP recommendations for continuing current diet w/ aspiration precautions and adding a swish and swallow oral rinse for any potential thrush d/t extended illness and antibiotics. ST services will f/u on Monday. Discussed general aspiration precautions and diet consistency w/ pt/wife.     Orinda Kenner, MS, CCC-SLP Watson,Katherine 04/23/2017, 10:29 AM

## 2017-04-23 NOTE — Consult Note (Signed)
Reason for Consult: Referring Physician: [place]  CC: Lethargy  HPI: [name] is an 56 y.o. male with multiple medical problems recently hospitalized at [facility] and discharged last week after he was transferred there for hemorrhagic infarct which was conservatively treated with medical management. Presented to the emergency room because his blood sugars were noted to be high at home. Patient's WBC count was noted to be significantly elevated. There was concern for peritonitis. Has not slept well at night and has been more lethargic during the day.  Is close to baseline today per wife.  Past Medical History:  Diagnosis Date  . Anemia   . Crohn disease ([place])   . Diabetes mellitus without complication ([place])   . DVT of lower extremity (deep venous thrombosis) ([place]) 2016  . Hidradenitis suppurativa   . Hypertension   . ICH (intracerebral hemorrhage) ([place])   . Renal disorder     Past Surgical History:  Procedure Laterality Date  . ABDOMINAL SURGERY    . KNEE SURGERY Left 02/04/2016   [facility]    Family History  Problem Relation Age of Onset  . Irritable bowel syndrome Sister   . Diabetes Sister   . Heart disease Mother   . Diabetes Mother   . Heart disease Father   . Rheumatic fever Father        as child  . Psoriasis Brother   . Arthritis Brother   . Diabetes Sister   . Diabetes Sister     Social History:  reports that he has never smoked. He has never used smokeless tobacco. He reports that he uses drugs, including Marijuana. He reports that he does not drink alcohol.  Allergies  Allergen Reactions  . Vancomycin Shortness Of Breath    Eyes watering, SOB, wheezing  . Methotrexate Other (See Comments)    Blood count drops  . Tape     Medications:  I have reviewed the patient's current medications. Prior to Admission:  Prescriptions Prior to Admission  Medication Sig Dispense Refill Last Dose  . acetaminophen (TYLENOL) 500 MG tablet Take 500 mg by mouth every 6 (six)  hours as needed.   prn at prn  . amLODipine (NORVASC) 10 MG tablet TAKE ONE TABLET BY MOUTH ONCE DAILY 30 tablet 0 04/21/2017 at am  . aspirin EC 81 MG tablet Take 81 mg by mouth daily.   04/21/2017 at am  . atorvastatin (LIPITOR) 80 MG tablet Take 80 mg by mouth daily.  0 04/21/2017 at am  . calcitRIOL (ROCALTROL) 0.25 MCG capsule Take 0.5 mcg by mouth daily.   1 04/21/2017 at am  . carvedilol (COREG) 25 MG tablet Take 25 mg by mouth 2 (two) times daily.   3 04/21/2017 at am  . furosemide (LASIX) 80 MG tablet Take 80 mg by mouth 2 (two) times daily.   04/21/2017 at am  . inFLIXimab (REMICADE) 100 MG injection Inject into the vein every 6 (six) weeks.    Taking  . insulin aspart (NOVOLOG FLEXPEN) 100 UNIT/ML FlexPen Take 8-15 units three times a day before each meal (Patient taking differently: Inject 6 Units into the skin 3 (three) times daily with meals. ) 1 pen 3 04/21/2017 at Unknown time  . LANTUS SOLOSTAR 100 UNIT/ML Solostar Pen INJECT 45 UNITS SUBCUTANEOUSLY ONCE DAILY -  INCREASE  BY  1  UNIT  DAILY  UNTIL  SUGARS  ARE  IMPROVED (Patient taking differently: INJECT 18 UNITS SUBCUTANEOUSLY QHS) 5 pen 3 04/20/2017 at qhs  .  levETIRAcetam (KEPPRA) 1000 MG tablet Take 1,000 mg by mouth at bedtime.   0 04/20/2017 at qhs  . lisinopril (PRINIVIL,ZESTRIL) 40 MG tablet Take 40 mg by mouth daily.  0 04/21/2017 at am  . sildenafil (VIAGRA) 100 MG tablet Take 1 tablet (100 mg total) by mouth daily as needed. 6 tablet 1 prn at prn  . sodium bicarbonate 650 MG tablet Take 650 mg by mouth 2 (two) times daily.   04/21/2017 at am  . glucose blood (BAYER CONTOUR TEST) test strip Check sugar three times daily for insulin dependent diabetes 100 each 12    Scheduled: . amLODipine  10 mg Oral Daily  . calcitRIOL  0.5 mcg Oral BID  . carvedilol  25 mg Oral BID  . ferric citrate  420 mg Oral TID WC  . folic acid  1 mg Oral Daily  . gentamicin cream  1 application Topical Daily  . insulin aspart  0-20 Units Subcutaneous TID WC  .  insulin aspart  6 Units Subcutaneous TID WC  . insulin glargine  40 Units Subcutaneous Q2200  . methylPREDNISolone (SOLU-MEDROL) injection  40 mg Intravenous Daily  . nystatin  5 mL Mouth/Throat QID  . pantoprazole (PROTONIX) IV  40 mg Intravenous Q12H  . pravastatin  40 mg Oral Daily  . sodium bicarbonate  650 mg Oral BID    ROS: History obtained from the patient and wife  General ROS: easily fatigued Psychological ROS: emotionally labile Ophthalmic ROS: negative for - blurry vision, double vision, eye pain or loss of vision ENT ROS: negative for - epistaxis, nasal discharge, oral lesions, sore throat, tinnitus or vertigo Allergy and Immunology ROS: negative for - hives or itchy/watery eyes Hematological and Lymphatic ROS: negative for - bleeding problems, bruising or swollen lymph nodes Endocrine ROS: negative for - galactorrhea, hair pattern changes, polydipsia/polyuria or temperature intolerance Respiratory ROS: cough Cardiovascular ROS: negative for - chest pain, dyspnea on exertion, edema or irregular heartbeat Gastrointestinal ROS: abdominal pain, nausea/vomiting Genito-Urinary ROS: negative for - dysuria, hematuria, incontinence or urinary frequency/urgency Musculoskeletal ROS: negative for - joint swelling or muscular weakness Neurological ROS: as noted in HPI Dermatological ROS: negative for rash and skin lesion changes  Physical Examination: Blood pressure 118/65, pulse 79, temperature 98.4 F (36.9 C), temperature source Oral, resp. rate 20, height 5' 11"  (1.803 m), weight 100.5 kg (221 lb 8 oz), SpO2 94 %.  HEENT-  Normocephalic, no lesions, without obvious abnormality.  Normal external eye and conjunctiva.  Normal TM's bilaterally.  Normal auditory canals and external ears. Normal external nose, mucus membranes and septum.  Normal pharynx. Cardiovascular- S1, S2 normal, pulses palpable throughout   Lungs- chest clear, no wheezing, rales, normal symmetric air  entry Abdomen- soft, non-tender; bowel sounds normal; no masses,  no organomegaly Extremities- LE edema Lymph-no adenopathy palpable Musculoskeletal-no joint tenderness, deformity or swelling Skin-warm and dry, no hyperpigmentation, vitiligo, or suspicious lesions  Neurological Examination   Mental Status: Lethargic but able to be alerted. Thought content appropriate.  Speech fluent without evidence of aphasia.  Able to follow 3 step commands. Cranial Nerves: II: Discs flat bilaterally; Visual fields grossly normal, pupils equal, round, reactive to light and accommodation III,IV, VI: ptosis not present, extra-ocular motions intact bilaterally V,VII: left facial droop, facial light touch sensation normal bilaterally VIII: hearing normal bilaterally IX,X: gag reflex present XI: bilateral shoulder shrug XII: midline tongue extension Motor: Right : Upper extremity   5/5    Left:     Upper  extremity   5-/5  Lower extremity   5/5     Lower extremity   5-/5 Tone and bulk:normal tone throughout; no atrophy noted Sensory: Pinprick and light touch intact throughout, bilaterally Deep Tendon Reflexes: 2+ and symmetric throughout Plantars: Right: mute   Left: mute Cerebellar: Normal finger-to-nose and normal heel-to-shin testing bilaterally Gait: not tested due to safety concerns   Laboratory Studies:   Basic Metabolic Panel:  Recent Labs Lab 04/21/17 1422 04/22/17 0546  NA 131* 133*  K 3.4* 3.3*  CL 89* 92*  CO2 24 25  GLUCOSE 481* 505*  BUN 76* 75*  CREATININE 11.52* 10.56*  CALCIUM 8.0* 8.4*    Liver Function Tests: No results for input(s): AST, ALT, ALKPHOS, BILITOT, PROT, ALBUMIN in the last 168 hours. No results for input(s): LIPASE, AMYLASE in the last 168 hours. No results for input(s): AMMONIA in the last 168 hours.  CBC:  Recent Labs Lab 04/21/17 1422 04/22/17 0546 04/22/17 1458  WBC 32.4* 28.1* 25.0*  HGB 8.8* 8.9* 8.9*  HCT 27.8* 27.2* 27.9*  MCV 83.5  83.0 82.7  PLT 272 267 294    Cardiac Enzymes: No results for input(s): CKTOTAL, CKMB, CKMBINDEX, TROPONINI in the last 168 hours.  BNP: Invalid input(s): POCBNP  CBG:  Recent Labs Lab 04/22/17 1340 04/22/17 1653 04/22/17 2109 04/23/17 0737 04/23/17 0752  GLUCAP 135* 140* 190* 466* 53*    Microbiology: Results for orders placed or performed during the hospital encounter of 04/21/17  Blood culture (routine x 2)     Status: None (Preliminary result)   Collection Time: 04/21/17  3:43 PM  Result Value Ref Range Status   Specimen Description BLOOD BLOOD LEFT HAND  Final   Special Requests   Final    BOTTLES DRAWN AEROBIC AND ANAEROBIC Blood Culture results may not be optimal due to an excessive volume of blood received in culture bottles   Culture NO GROWTH 2 DAYS  Final   Report Status PENDING  Incomplete  Blood culture (routine x 2)     Status: None (Preliminary result)   Collection Time: 04/21/17  3:43 PM  Result Value Ref Range Status   Specimen Description BLOOD RIGHT ANTECUBITAL  Final   Special Requests   Final    BOTTLES DRAWN AEROBIC AND ANAEROBIC Blood Culture results may not be optimal due to an excessive volume of blood received in culture bottles   Culture NO GROWTH 2 DAYS  Final   Report Status PENDING  Incomplete    Coagulation Studies: No results for input(s): LABPROT, INR in the last 72 hours.  Urinalysis:  Recent Labs Lab 04/21/17 1422  COLORURINE YELLOW*  LABSPEC 1.017  PHURINE 5.0  GLUCOSEU >=500*  HGBUR SMALL*  BILIRUBINUR NEGATIVE  KETONESUR NEGATIVE  PROTEINUR 100*  NITRITE NEGATIVE  LEUKOCYTESUR SMALL*    Lipid Panel:     Component Value Date/Time   CHOL 167 02/20/2016 1522   TRIG 136 02/20/2016 1522   HDL 43 02/20/2016 1522   LDLCALC 97 02/20/2016 1522    HgbA1C:  Lab Results  Component Value Date   HGBA1C 10.1 (H) 04/21/2017    Urine Drug Screen:     Component Value Date/Time   LABOPIA NONE DETECTED 04/13/2017 1455    COCAINSCRNUR NONE DETECTED 04/13/2017 1455   LABBENZ NONE DETECTED 04/13/2017 1455   AMPHETMU NONE DETECTED 04/13/2017 1455   THCU POSITIVE (A) 04/13/2017 1455   LABBARB NONE DETECTED 04/13/2017 1455    Alcohol Level: No results for input(s):  ETH in the last 168 hours.   Imaging: Dg Chest 2 View  Result Date: 04/21/2017 CLINICAL DATA:  56 year old male with history of hyperglycemia and vomiting. Diarrhea earlier days. EXAM: CHEST  2 VIEW COMPARISON:  Chest x-ray 04/13/2017. FINDINGS: There is cephalization of the pulmonary vasculature and slight indistinctness of the interstitial markings suggestive of mild pulmonary edema. Trace bilateral pleural effusions. Mild cardiomegaly. Upper mediastinal contours are within normal limits. Aortic atherosclerosis. IMPRESSION: 1. The appearance of the chest suggests mild congestive heart failure, as above. Electronically Signed   By: Vinnie Langton M.D.   On: 04/21/2017 15:41   Ct Head Wo Contrast  Result Date: 04/22/2017 CLINICAL DATA:  Increasing confusion over the past 24 hours. Abnormal head CT with a likely right frontal infarct. EXAM: CT HEAD WITHOUT CONTRAST TECHNIQUE: Contiguous axial images were obtained from the base of the skull through the vertex without intravenous contrast. COMPARISON:  Head CT scan 04/21/2017, 04/13/2017 and 11/06/2015. FINDINGS: Brain: Hypoattenuation the right frontal lobe is again seen. Scattered areas of peripheral hyperattenuation about the lesion are again seen and not notably changed since the most recent examination and likely represent a small amount of associated hemorrhage. No subdural hemorrhage is identified. No midline shift or hydrocephalus. Vascular: Age advanced atherosclerosis of the carotid siphons is noted. Skull: Negative. Sinuses/Orbits: Negative. Other: None. IMPRESSION: No change in the appearance of hypoattenuation in the right frontal lobe likely due to infarct with an associated small amount of  hemorrhage. MRI with and without contrast could be used for confirmation. No new abnormality compared to the most recent exam. Electronically Signed   By: Inge Rise M.D.   On: 04/22/2017 15:01   Ct Head Wo Contrast  Result Date: 04/21/2017 CLINICAL DATA:  56 year old male with a history of prior stroke with admission at [facility]. Headache EXAM: CT HEAD WITHOUT CONTRAST TECHNIQUE: Contiguous axial images were obtained from the base of the skull through the vertex without intravenous contrast. COMPARISON:  04/13/2017, 11/06/2015 FINDINGS: Brain: Re- demonstration of confluent hypodensity in the right frontal lobe in this patient with known prior stroke. Gyriform hyperdensity with configuration of the frontal gyration. No definite evidence of acute subarachnoid hemorrhage, subdural hemorrhage, or epidural hemorrhage. No local mass effect. Unchanged configuration of the ventricles. Remainder of the gray-white differentiation maintained. Vascular: Calcifications of the anterior circulation. Skull: No displaced fracture. Sinuses/Orbits: Unremarkable Other: None IMPRESSION: Re- demonstration of right frontal hypodensity, presumed to represent evolving subacute/ chronic infarction in this patient with given clinical history of prior stroke. The gyriform hyperdensity may represent evolving cortical necrosis/ mineralization, although superimposed small volume petechial hemorrhage not excluded. No evidence of subarachnoid hemorrhage or mass effect. Intracranial atherosclerosis. Electronically Signed   By: Corrie Mckusick D.O.   On: 04/21/2017 16:29     Assessment/Plan: 56 year old male s/p recent hemorrhagic infarct.  On ASA.  Hemoglobin of 8.9 due to anemia of chronic disease.  Question as to whether to start patient on Procrit.  Procrit with an increased risk of stroke.  Patient also with multiple stroke risk factors that are not well controlled including blood sugars.  Recommendations: 1.  Would hold  Procrit for now.   2.  Continue therapy 3.  Continue ASA and attempts at blood sugar control.    Alexis Goodell, MD Neurology [phone] 04/23/2017, 11:11 AM

## 2017-04-23 NOTE — Progress Notes (Signed)
[place] [facility] [place] NAME: [name]    MR#:  [id number]  DATE OF BIRTH:  09/05/1961  SUBJECTIVE: Patient is seen at bedside, admitted for fall, found to to have infection and possible peritonitis. Wife thinks that he has throat pain and difficulty swallowing and she does not think he has peritonitis. Patient has no abdominal pain.  Low Grade temp. Was at [facility] recently. Was intubated that time. Since then after extubation he is been having throat pain, difficulty swallowing, burning in the chest. He also has some cough unable to get the phlegm and when he coughs he is having pain in his abdomen. Her sugars have been in the range of 500s last night, now it's better.  was just hospitalized at [facility] from 6/27 to 7/3 for hemorrhagic conversion of ischemic stroke.   Patient went home when his dialysis catheter was contaminated. Patient then went to the dialysis center on Wednesday 7/4 where he was given empiric ceftazidime and cephalexin. He had a new catheter placed.   He started to develop elevated glucose levels, subjective chills and abdominal pain and presented to [facility].       CHIEF COMPLAINT:   Chief Complaint  Patient presents with  . Hyperglycemia    REVIEW OF SYSTEMS:    Review of Systems  Constitutional: Negative for chills and fever.  HENT: Negative for hearing loss and sore throat.   Eyes: Negative for blurred vision, double vision and photophobia.  Respiratory: Positive for cough and sputum production. Negative for hemoptysis and shortness of breath.   Cardiovascular: Negative for palpitations, orthopnea and leg swelling.  Gastrointestinal: Positive for heartburn. Negative for abdominal pain, diarrhea and vomiting.  Genitourinary: Negative for dysuria and urgency.  Musculoskeletal: Negative for myalgias and neck pain.  Skin: Negative for rash.  Neurological: Negative for dizziness, focal weakness, seizures, weakness and  headaches.  Psychiatric/Behavioral: Negative for memory loss. The patient does not have insomnia.     Nutrition:  Tolerating Diet: Tolerating PT:      DRUG ALLERGIES:   Allergies  Allergen Reactions  . Vancomycin Shortness Of Breath    Eyes watering, SOB, wheezing  . Methotrexate Other (See Comments)    Blood count drops  . Tape     VITALS:  Blood pressure 118/65, pulse 79, temperature 98.4 F (36.9 C), temperature source Oral, resp. rate 20, height 5' 11"  (1.803 m), weight 100.5 kg (221 lb 8 oz), SpO2 94 %.  PHYSICAL EXAMINATION:   Physical Exam  GENERAL:  56 y.o.-year-old patient lying in the bed with no acute distress.  EYES: Pupils equal, round, reactive to light and accommodation. No scleral icterus. Extraocular muscles intact.  HEENT: Head atraumatic, normocephalic. Oropharynx and nasopharynx clear.  NECK:  Supple, no jugular venous distention. No thyroid enlargement, no tenderness.  LUNGS: Normal breath sounds bilaterally, no wheezing, rales,rhonchi or crepitation. No use of accessory muscles of respiration.  CARDIOVASCULAR: S1, S2 normal. No murmurs, rubs, or gallops.  ABDOMEN: Soft, nontender, nondistended. Bowel sounds present. No organomegaly or mass. Peritoneal catheter site is clean. EXTREMITIES: No pedal edema, cyanosis, or clubbing.  NEUROLOGIC: Cranial nerves II through XII are intact. Muscle strength 5/5 in all extremities. Sensation intact. Gait not checked.  PSYCHIATRIC: The patient is alert and oriented x 3.  SKIN: No obvious rash, lesion, or ulcer.    LABORATORY PANEL:   CBC  Recent Labs Lab 04/22/17 1458  WBC 25.0*  HGB 8.9*  HCT 27.9*  PLT 294   ------------------------------------------------------------------------------------------------------------------  Chemistries   Recent Labs Lab 04/22/17 0546  NA 133*  K 3.3*  CL 92*  CO2 25  GLUCOSE 505*  BUN 75*  CREATININE 10.56*  CALCIUM 8.4*    ------------------------------------------------------------------------------------------------------------------  Cardiac Enzymes No results for input(s): TROPONINI in the last 168 hours. ------------------------------------------------------------------------------------------------------------------  RADIOLOGY:  Dg Chest 2 View  Result Date: 04/21/2017 CLINICAL DATA:  56 year old male with history of hyperglycemia and vomiting. Diarrhea earlier days. EXAM: CHEST  2 VIEW COMPARISON:  Chest x-ray 04/13/2017. FINDINGS: There is cephalization of the pulmonary vasculature and slight indistinctness of the interstitial markings suggestive of mild pulmonary edema. Trace bilateral pleural effusions. Mild cardiomegaly. Upper mediastinal contours are within normal limits. Aortic atherosclerosis. IMPRESSION: 1. The appearance of the chest suggests mild congestive heart failure, as above. Electronically Signed   By: Vinnie Langton M.D.   On: 04/21/2017 15:41   Ct Head Wo Contrast  Result Date: 04/22/2017 CLINICAL DATA:  Increasing confusion over the past 24 hours. Abnormal head CT with a likely right frontal infarct. EXAM: CT HEAD WITHOUT CONTRAST TECHNIQUE: Contiguous axial images were obtained from the base of the skull through the vertex without intravenous contrast. COMPARISON:  Head CT scan 04/21/2017, 04/13/2017 and 11/06/2015. FINDINGS: Brain: Hypoattenuation the right frontal lobe is again seen. Scattered areas of peripheral hyperattenuation about the lesion are again seen and not notably changed since the most recent examination and likely represent a small amount of associated hemorrhage. No subdural hemorrhage is identified. No midline shift or hydrocephalus. Vascular: Age advanced atherosclerosis of the carotid siphons is noted. Skull: Negative. Sinuses/Orbits: Negative. Other: None. IMPRESSION: No change in the appearance of hypoattenuation in the right frontal lobe likely due to infarct with an  associated small amount of hemorrhage. MRI with and without contrast could be used for confirmation. No new abnormality compared to the most recent exam. Electronically Signed   By: Inge Rise M.D.   On: 04/22/2017 15:01   Ct Head Wo Contrast  Result Date: 04/21/2017 CLINICAL DATA:  56 year old male with a history of prior stroke with admission at [place]. Headache EXAM: CT HEAD WITHOUT CONTRAST TECHNIQUE: Contiguous axial images were obtained from the base of the skull through the vertex without intravenous contrast. COMPARISON:  04/13/2017, 11/06/2015 FINDINGS: Brain: Re- demonstration of confluent hypodensity in the right frontal lobe in this patient with known prior stroke. Gyriform hyperdensity with configuration of the frontal gyration. No definite evidence of acute subarachnoid hemorrhage, subdural hemorrhage, or epidural hemorrhage. No local mass effect. Unchanged configuration of the ventricles. Remainder of the gray-white differentiation maintained. Vascular: Calcifications of the anterior circulation. Skull: No displaced fracture. Sinuses/Orbits: Unremarkable Other: None IMPRESSION: Re- demonstration of right frontal hypodensity, presumed to represent evolving subacute/ chronic infarction in this patient with given clinical history of prior stroke. The gyriform hyperdensity may represent evolving cortical necrosis/ mineralization, although superimposed small volume petechial hemorrhage not excluded. No evidence of subarachnoid hemorrhage or mass effect. Intracranial atherosclerosis. Electronically Signed   By: Corrie Mckusick D.O.   On: 04/21/2017 16:29     ASSESSMENT AND PLAN:   Active Problems:   Peritonitis ([place])  #1 history of fall, leukocytosis likely due to acute bronchitis and possible peritonitis. Patient has no abdominal pain, no purulent discharge from peritoneal catheter site,continue Antibiotics, WBC is coming down. Abdominal  pain: Patient had abdominal ultrasound  today.continue PPI,treat constipation.  2 diabetes mellitus type 2: Uncontrolled. Due to steroids. Adjusted the Lantus, NovoLog.    #3  history of Crohn's disease,; patient is on Remicade, denies any issues with diarrhea or abdominal pain.   4.history of right frontal lobe stroke: Small petechial hemorrhages . Physical therapy consult. Continue statins, avoid aspirin for recent history of hemorrhage   odynophagia and dysphagia: Likely due to multifactorial from diabetes mellitus and stroke, ESRD, Crohn's disease. Admitted nystatin swish and state to see if it helps because wife said that after recent extubation he is feeling throat pain, difficulty swallowing. He isn't MB a strict [facility] was within normal limits, spoke with patient's speech therapist.Katherine.  NO AC,PT to follow   D/w wife,RN   All the records are reviewed and case discussed with Care Management/Social Workerr. Management plans discussed with the patient, family and they are in agreement.  CODE STATUS:full  TOTAL TIME TAKING CARE OF THIS PATIENT: 38mnutes.   POSSIBLE D/C IN 1-2 DAYS, DEPENDING ON CLINICAL CONDITION.   KEpifanio LeschesM.D on 04/23/2017 at 11:00 AM  Between 7am to 6pm - Pager - [phone]  After 6pm go to [email or web address] - password [email or web address]  Office  [phone] CC: Primary care physician; FBirdie Sons MD

## 2017-04-23 NOTE — Progress Notes (Signed)
[facility]  ROUNDING NOTE   Subjective:   Peritoneal dialysis last night. Tolerated treatment. UF 357mL.   Wife at bedside.   Objective:  Vital signs in last 24 hours:  Temp:  [98.4 F (36.9 C)-99.5 F (37.5 C)] 98.4 F (36.9 C) (07/07 0624) Pulse Rate:  [78-81] 79 (07/07 0624) Resp:  [0-28] 20 (07/07 0624) BP: (101-118)/(58-65) 118/65 (07/07 0624) SpO2:  [94 %-97 %] 94 % (07/07 0624) Weight:  [100.5 kg (221 lb 8 oz)] 100.5 kg (221 lb 8 oz) (07/07 0624)  Weight change: -2.041 kg (-4 lb 8 oz) Filed Weights   04/21/17 1423 04/22/17 0602 04/23/17 0624  Weight: 102.5 kg (226 lb) 101.3 kg (223 lb 4.8 oz) 100.5 kg (221 lb 8 oz)    Intake/Output: I/O last 3 completed shifts: In: 700 [P.O.:600; IV Piggyback:100] Out: 155 [Other:155]   Intake/Output this shift:  Total I/O In: -  Out: 331 [Other:331]  Physical Exam: General: NAD,   Head: Normocephalic, atraumatic. Moist oral mucosal membranes  Eyes: Anicteric, PERRL  Neck: Supple, trachea midline  Lungs:  Clear to auscultation  Heart: Regular rate and rhythm  Abdomen:  Soft, nontender,   Extremities: no peripheral edema.  Neurologic: Nonfocal, moving all four extremities  Skin: No lesions  Access: PD catheter    Basic Metabolic Panel:  Recent Labs Lab 04/21/17 1422 04/22/17 0546  NA 131* 133*  K 3.4* 3.3*  CL 89* 92*  CO2 24 25  GLUCOSE 481* 505*  BUN 76* 75*  CREATININE 11.52* 10.56*  CALCIUM 8.0* 8.4*    Liver Function Tests: No results for input(s): AST, ALT, ALKPHOS, BILITOT, PROT, ALBUMIN in the last 168 hours. No results for input(s): LIPASE, AMYLASE in the last 168 hours. No results for input(s): AMMONIA in the last 168 hours.  CBC:  Recent Labs Lab 04/21/17 1422 04/22/17 0546 04/22/17 1458  WBC 32.4* 28.1* 25.0*  HGB 8.8* 8.9* 8.9*  HCT 27.8* 27.2* 27.9*  MCV 83.5 83.0 82.7  PLT 272 267 294    Cardiac Enzymes: No results for input(s): CKTOTAL, CKMB, CKMBINDEX,  TROPONINI in the last 168 hours.  BNP: Invalid input(s): POCBNP  CBG:  Recent Labs Lab 04/22/17 1340 04/22/17 1653 04/22/17 2109 04/23/17 0737 04/23/17 0752  GLUCAP 135* 140* 190* 466* 92*    Microbiology: Results for orders placed or performed during the hospital encounter of 04/21/17  Blood culture (routine x 2)     Status: None (Preliminary result)   Collection Time: 04/21/17  3:43 PM  Result Value Ref Range Status   Specimen Description BLOOD BLOOD LEFT HAND  Final   Special Requests   Final    BOTTLES DRAWN AEROBIC AND ANAEROBIC Blood Culture results may not be optimal due to an excessive volume of blood received in culture bottles   Culture NO GROWTH 2 DAYS  Final   Report Status PENDING  Incomplete  Blood culture (routine x 2)     Status: None (Preliminary result)   Collection Time: 04/21/17  3:43 PM  Result Value Ref Range Status   Specimen Description BLOOD RIGHT ANTECUBITAL  Final   Special Requests   Final    BOTTLES DRAWN AEROBIC AND ANAEROBIC Blood Culture results may not be optimal due to an excessive volume of blood received in culture bottles   Culture NO GROWTH 2 DAYS  Final   Report Status PENDING  Incomplete    Coagulation Studies: No results for input(s): LABPROT, INR in the last 72 hours.  Urinalysis:  Recent Labs  04/21/17 1422  COLORURINE YELLOW*  LABSPEC 1.017  PHURINE 5.0  GLUCOSEU >=500*  HGBUR SMALL*  BILIRUBINUR NEGATIVE  KETONESUR NEGATIVE  PROTEINUR 100*  NITRITE NEGATIVE  LEUKOCYTESUR SMALL*      Imaging: Dg Chest 2 View  Result Date: 04/21/2017 CLINICAL DATA:  56 year old male with history of hyperglycemia and vomiting. Diarrhea earlier days. EXAM: CHEST  2 VIEW COMPARISON:  Chest x-ray 04/13/2017. FINDINGS: There is cephalization of the pulmonary vasculature and slight indistinctness of the interstitial markings suggestive of mild pulmonary edema. Trace bilateral pleural effusions. Mild cardiomegaly. Upper mediastinal  contours are within normal limits. Aortic atherosclerosis. IMPRESSION: 1. The appearance of the chest suggests mild congestive heart failure, as above. Electronically Signed   By: Vinnie Langton M.D.   On: 04/21/2017 15:41   Ct Head Wo Contrast  Result Date: 04/22/2017 CLINICAL DATA:  Increasing confusion over the past 24 hours. Abnormal head CT with a likely right frontal infarct. EXAM: CT HEAD WITHOUT CONTRAST TECHNIQUE: Contiguous axial images were obtained from the base of the skull through the vertex without intravenous contrast. COMPARISON:  Head CT scan 04/21/2017, 04/13/2017 and 11/06/2015. FINDINGS: Brain: Hypoattenuation the right frontal lobe is again seen. Scattered areas of peripheral hyperattenuation about the lesion are again seen and not notably changed since the most recent examination and likely represent a small amount of associated hemorrhage. No subdural hemorrhage is identified. No midline shift or hydrocephalus. Vascular: Age advanced atherosclerosis of the carotid siphons is noted. Skull: Negative. Sinuses/Orbits: Negative. Other: None. IMPRESSION: No change in the appearance of hypoattenuation in the right frontal lobe likely due to infarct with an associated small amount of hemorrhage. MRI with and without contrast could be used for confirmation. No new abnormality compared to the most recent exam. Electronically Signed   By: Inge Rise M.D.   On: 04/22/2017 15:01   Ct Head Wo Contrast  Result Date: 04/21/2017 CLINICAL DATA:  56 year old male with a history of prior stroke with admission at [place]. Headache EXAM: CT HEAD WITHOUT CONTRAST TECHNIQUE: Contiguous axial images were obtained from the base of the skull through the vertex without intravenous contrast. COMPARISON:  04/13/2017, 11/06/2015 FINDINGS: Brain: Re- demonstration of confluent hypodensity in the right frontal lobe in this patient with known prior stroke. Gyriform hyperdensity with configuration of the  frontal gyration. No definite evidence of acute subarachnoid hemorrhage, subdural hemorrhage, or epidural hemorrhage. No local mass effect. Unchanged configuration of the ventricles. Remainder of the gray-white differentiation maintained. Vascular: Calcifications of the anterior circulation. Skull: No displaced fracture. Sinuses/Orbits: Unremarkable Other: None IMPRESSION: Re- demonstration of right frontal hypodensity, presumed to represent evolving subacute/ chronic infarction in this patient with given clinical history of prior stroke. The gyriform hyperdensity may represent evolving cortical necrosis/ mineralization, although superimposed small volume petechial hemorrhage not excluded. No evidence of subarachnoid hemorrhage or mass effect. Intracranial atherosclerosis. Electronically Signed   By: Corrie Mckusick D.O.   On: 04/21/2017 16:29     Medications:   . meropenem (MERREM) IV Stopped (04/22/17 2000)   . amLODipine  10 mg Oral Daily  . calcitRIOL  0.5 mcg Oral BID  . carvedilol  25 mg Oral BID  . ferrous sulfate  325 mg Oral BID WC  . folic acid  1 mg Oral Daily  . gentamicin cream  1 application Topical Daily  . insulin aspart  0-20 Units Subcutaneous TID WC  . insulin aspart  4 Units Subcutaneous TID WC  . insulin  glargine  30 Units Subcutaneous Q2200  . methylPREDNISolone (SOLU-MEDROL) injection  40 mg Intravenous Daily  . pantoprazole (PROTONIX) IV  40 mg Intravenous Q12H  . pravastatin  40 mg Oral Daily  . sodium bicarbonate  650 mg Oral BID   acetaminophen **OR** acetaminophen, guaiFENesin-dextromethorphan, HYDROcodone-acetaminophen, ipratropium-albuterol, ondansetron **OR** ondansetron (ZOFRAN) IV  Assessment/ Plan:  [name] is a 56 y.o.  male [name] is a 56 y.o. black male with End Stage Renal Disease on peritoneal dialysis, hypertension, insulin dependent diabetes mellitus type II, history of intracerebral hemorrhage, history fo DVT, crohn's disease  on infliximab, seizure disorder who was admitted to [facility] on 04/21/2017   Peritoneal Dialysis Prescription CCPD 9 hours 2.5 litres 4 exchanges. No last fill  [facility] [place]   1. End Stage Renal Disease:  On peritoneal dialysis - Tolerated peritoneal dialysis last night. Orders prepared for tonight - on sodium bicarbonate  2. Abdominal pain: peritonitis versus constipation:  - check abdominal ultrasound - empiric antibiotics - pending cell count and cultures.  - start stool softeners.   3. Hypertension: blood pressure at goal.  - carvedilol, amlodipine  4. Diabetes mellitus type II with chronic kidney disease: insulin dependent. Not well controlled. Hemoglobin A1c 10.2% on 04/13/17  5. Anemia of chronic kidney disease: Hemoglobin 8.9 - consult neurology to see if patient may receive erythropoietin due to recent hemorrhagic stroke.  - Restart Auryxia  6. Secondary Hyperparathyroidism - restart Auryxia    LOS: 2 Niklaus Mamaril 7/7/201810:15 AM

## 2017-04-24 LAB — GLUCOSE, CAPILLARY
Glucose-Capillary: 329 mg/dL — ABNORMAL HIGH (ref 65–99)
Glucose-Capillary: 161 mg/dL — ABNORMAL HIGH (ref 65–99)
Glucose-Capillary: 71 mg/dL (ref 65–99)
Glucose-Capillary: 84 mg/dL (ref 65–99)
Glucose-Capillary: 123 mg/dL — ABNORMAL HIGH (ref 65–99)
Glucose-Capillary: 239 mg/dL — ABNORMAL HIGH (ref 65–99)

## 2017-04-24 LAB — CBC
HCT: 26.9 % — ABNORMAL LOW (ref 40.0–52.0)
Hemoglobin: 8.9 g/dL — ABNORMAL LOW (ref 13.0–18.0)
MCH: 27.8 pg (ref 26.0–34.0)
MCHC: 33.3 g/dL (ref 32.0–36.0)
MCV: 83.3 fL (ref 80.0–100.0)
Platelets: 260 10*3/uL (ref 150–440)
RBC: 3.22 MIL/uL — ABNORMAL LOW (ref 4.40–5.90)
RDW: 18.6 % — ABNORMAL HIGH (ref 11.5–14.5)
WBC: 19.8 10*3/uL — ABNORMAL HIGH (ref 3.8–10.6)

## 2017-04-24 MED ORDER — PREDNISONE 20 MG PO TABS
10.0000 mg | ORAL_TABLET | Freq: Every day | ORAL | Status: DC
  Administered 2017-04-25: 10 mg via ORAL
  Filled 2017-04-24: qty 1

## 2017-04-24 NOTE — Progress Notes (Signed)
Physical Therapy Treatment Patient Details Name: [name] MRN: [id number] DOB: Sep 04, 1961 Today's Date: 04/24/2017    History of Present Illness 56 yo male had ischemic stroke and admitted for hemorrhagic progression noted after falling, had HD cath contamination and replaced this week.  Was intubated in [facility] after stroke, now cough and sore throat.  Uncontrolled DM blamed for progression of stroke.  Admitted to [facility] for elevated BS, concern for peritonitis and on aspiration precautions.  PMHx:  stroke, DM, DVT, HTN,     PT Comments    Patient is progress well. He exhibits better balance and stance control being able to do hygiene tasks with 1 HHA on counter with supervision. Patient exhibits good weight shift and no loss of balance. He also progressed gait tasks with walking x175 feet with RW, supervision. Patient does require cues to increase step length and improve ankle DF at heel strike for better gait mechanics. He walks at a slower gait speed (1.05 feet/sec) indicating increased risk for falls. Patient would benefit from additional skilled PT intervention to improve strength, balance and gait safety;     Follow Up Recommendations  Home health PT;Supervision for mobility/OOB     Equipment Recommendations  None recommended by PT    Recommendations for Other Services       Precautions / Restrictions Precautions Precautions: Fall Restrictions Weight Bearing Restrictions: No    Mobility  Bed Mobility Overal bed mobility: Needs Assistance Bed Mobility: Supine to Sit     Supine to sit: Supervision;HOB elevated     General bed mobility comments: Requires min VCs for hand placement on bed rail; able to push self up well with elevated head of bed;   Transfers Overall transfer level: Needs assistance Equipment used: Rolling walker (2 wheeled) Transfers: Sit to/from Stand Sit to Stand: Supervision (cued hands)         General transfer comment: Transfers Sit<>Stand  commode with RW/grab bar, supervision exhibiting good safety and hand placement x1 reps; Transfers sit<>stand from bed with RW supervision with good hand placement and weight shift x1 reps;  Ambulation/Gait Ambulation/Gait assistance: Supervision Ambulation Distance (Feet): 175 Feet Assistive device: Rolling walker (2 wheeled) Gait Pattern/deviations: Step-through pattern;Decreased stride length;Trunk flexed;Shuffle Gait velocity: 1.05 m/s with RW Gait velocity interpretation: <1.8 ft/sec, indicative of risk for recurrent falls General Gait Details: patient ambulated with supervision exhibiting good safety awareness; He did require cues to increase step length and improve ankle DF at heel strike for less foot drag; Patient denies any fatigue;    Stairs            Wheelchair Mobility    Modified Rankin (Stroke Patients Only) Modified Rankin (Stroke Patients Only) Pre-Morbid Rankin Score: Moderate disability Modified Rankin: Moderately severe disability     Balance Overall balance assessment: Needs assistance Sitting-balance support: Feet supported Sitting balance-Leahy Scale: Good Sitting balance - Comments: demonstrates good trunk awareness with no loss of balance with UE unassisted;    Standing balance support: Single extremity supported;During functional activity Standing balance-Leahy Scale: Good Standing balance comment: Patient instructed in static standing balance with 1 HHA on counter with other UE doing hygiene including washing face, brushing teeth, combing hair x10 min; Patient able to complete all tasks with supervision exhibiting good stance control without loss of balance even when reaching outside base of support;  Cognition Arousal/Alertness: Awake/alert Behavior During Therapy: Flat affect Overall Cognitive Status: Within Functional Limits for tasks assessed                                         Exercises Other Exercises Other Exercises: Instructed patient in static/dynamic balance with standing tasks, see above. He is able to exhibit good trunk control without loss of balance;     General Comments        Pertinent Vitals/Pain Pain Assessment: No/denies pain    Home Living                      Prior Function            PT Goals (current goals can now be found in the care plan section) Acute Rehab PT Goals Patient Stated Goal: to get up to walk PT Goal Formulation: With patient/family Time For Goal Achievement: 05/07/17 Potential to Achieve Goals: Good Progress towards PT goals: Progressing toward goals    Frequency    7X/week      PT Plan      Co-evaluation              AM-PAC PT "6 Clicks" Daily Activity  Outcome Measure  Difficulty turning over in bed (including adjusting bedclothes, sheets and blankets)?: A Little Difficulty moving from lying on back to sitting on the side of the bed? : A Little Difficulty sitting down on and standing up from a chair with arms (e.g., wheelchair, bedside commode, etc,.)?: A Little Help needed moving to and from a bed to chair (including a wheelchair)?: A Little Help needed walking in hospital room?: A Little Help needed climbing 3-5 steps with a railing? : A Lot 6 Click Score: 17    End of Session Equipment Utilized During Treatment: Gait belt Activity Tolerance: Patient tolerated treatment well Patient left: in chair;with call bell/phone within reach;with chair alarm set;with family/visitor present Nurse Communication: Mobility status PT Visit Diagnosis: Hemiplegia and hemiparesis;Difficulty in walking, not elsewhere classified (R26.2);Unsteadiness on feet (R26.81) Hemiplegia - Right/Left: Left Hemiplegia - dominant/non-dominant: Non-dominant Hemiplegia - caused by: Nontraumatic intracerebral hemorrhage     Time: 1230-1302 PT Time Calculation (min) (ACUTE ONLY): 32 min  Charges:  $Gait  Training: 8-22 mins $Neuromuscular Re-education: 8-22 mins                    G Codes:         Hanalei Glace PT, DPT 04/24/2017, 2:00 PM

## 2017-04-24 NOTE — Progress Notes (Signed)
[facility] NAME: [name]    MR#:  [id number]  DATE OF BIRTH:  [birth year]  SUBJECTIVE: He says he feels slightly better today. Less difficulty swallowing. Slept well. Afebrile.     CHIEF COMPLAINT:   Chief Complaint  Patient presents with  . Hyperglycemia    REVIEW OF SYSTEMS:    Review of Systems  Constitutional: Negative for chills and fever.  HENT: Negative for hearing loss and sore throat.   Eyes: Negative for blurred vision, double vision and photophobia.  Respiratory: Negative for cough, hemoptysis, sputum production and shortness of breath.   Cardiovascular: Negative for palpitations, orthopnea and leg swelling.  Gastrointestinal: Negative for abdominal pain, diarrhea, heartburn and vomiting.  Genitourinary: Negative for dysuria and urgency.  Musculoskeletal: Negative for myalgias and neck pain.  Skin: Negative for rash.  Neurological: Negative for dizziness, focal weakness, seizures, weakness and headaches.  Psychiatric/Behavioral: Negative for memory loss. The patient does not have insomnia.     Nutrition:  Tolerating Diet: Tolerating PT:      DRUG ALLERGIES:   Allergies  Allergen Reactions  . Vancomycin Shortness Of Breath    Eyes watering, SOB, wheezing  . Methotrexate Other (See Comments)    Blood count drops  . Tape     VITALS:  Blood pressure 120/69, pulse 71, temperature 98.7 F (37.1 C), temperature source Oral, resp. rate 20, height 5' 11"  (1.803 m), weight 103.2 kg (227 lb 9.6 oz), SpO2 93 %.  PHYSICAL EXAMINATION:   Physical Exam  GENERAL:  56 y.o.-year-old patient lying in the bed with no acute distress. Over all  Ill-looking male. EYES: Pupils equal, round, reactive to light and accommodation. No scleral icterus. Extraocular muscles intact.  HEENT: Head atraumatic, normocephalic. Oropharynx and nasopharynx clear.  NECK:  Supple, no jugular venous distention. No thyroid  enlargement, no tenderness.  LUNGS: Normal breath sounds bilaterally, no wheezing, rales,rhonchi or crepitation. No use of accessory muscles of respiration.  CARDIOVASCULAR: S1, S2 normal. No murmurs, rubs, or gallops.  ABDOMEN: Soft, nontender, nondistended. Bowel sounds present. No organomegaly or mass. Peritoneal catheter site is clean. EXTREMITIES: No pedal edema, cyanosis, or clubbing.  NEUROLOGIC: Cranial nerves II through XII are intact. Muscle strength 5/5 in all extremities. Sensation intact. Gait not checked.  PSYCHIATRIC: The patient is alert and oriented x 3.  SKIN: No obvious rash, lesion, or ulcer.    LABORATORY PANEL:   CBC  Recent Labs Lab 04/24/17 0509  WBC 19.8*  HGB 8.9*  HCT 26.9*  PLT 260   ------------------------------------------------------------------------------------------------------------------  Chemistries   Recent Labs Lab 04/22/17 0546  NA 133*  K 3.3*  CL 92*  CO2 25  GLUCOSE 505*  BUN 75*  CREATININE 10.56*  CALCIUM 8.4*   ------------------------------------------------------------------------------------------------------------------  Cardiac Enzymes No results for input(s): TROPONINI in the last 168 hours. ------------------------------------------------------------------------------------------------------------------  RADIOLOGY:  Ct Head Wo Contrast  Result Date: 04/22/2017 CLINICAL DATA:  Increasing confusion over the past 24 hours. Abnormal head CT with a likely right frontal infarct. EXAM: CT HEAD WITHOUT CONTRAST TECHNIQUE: Contiguous axial images were obtained from the base of the skull through the vertex without intravenous contrast. COMPARISON:  Head CT scan 04/21/2017, 04/13/2017 and 11/06/2015. FINDINGS: Brain: Hypoattenuation the right frontal lobe is again seen. Scattered areas of peripheral hyperattenuation about the lesion are again seen and not notably changed since the most recent examination and likely represent a  small amount of associated hemorrhage. No subdural hemorrhage is  identified. No midline shift or hydrocephalus. Vascular: Age advanced atherosclerosis of the carotid siphons is noted. Skull: Negative. Sinuses/Orbits: Negative. Other: None. IMPRESSION: No change in the appearance of hypoattenuation in the right frontal lobe likely due to infarct with an associated small amount of hemorrhage. MRI with and without contrast could be used for confirmation. No new abnormality compared to the most recent exam. Electronically Signed   By: Inge Rise M.D.   On: 04/22/2017 15:01   US Abdomen Complete  Result Date: 04/23/2017 CLINICAL DATA:  Patient with abdominal pain for 1 week. EXAM: ABDOMEN ULTRASOUND COMPLETE COMPARISON:  Renal ultrasound 11/07/2015. FINDINGS: Gallbladder: Small amount of sludge within the gallbladder lumen. No gallbladder wall thickening or pericholecystic fluid. Negative sonographic Murphy sign. Common bile duct: Diameter: 4 mm Liver: Mildly increased in echogenicity. No focal lesion identified. IVC: No abnormality visualized. Pancreas: Visualized portion unremarkable. Spleen: Size and appearance within normal limits. Right Kidney: Length: 12.4 cm. Normal renal cortical thickness. Increased renal cortical echogenicity. 5 mm stone within the inferior pole. No hydronephrosis. Left Kidney: Length: 11.8 cm. Normal renal cortical thickness. Increased renal cortical echogenicity. No hydronephrosis. Abdominal aorta: No aneurysm visualized. Other findings: Bilateral pleural effusions. IMPRESSION: Possible small amount of sludge within the gallbladder lumen. No secondary signs to suggest acute cholecystitis. Echogenic kidneys bilaterally raising the possibility of chronic medical renal disease. Mild increased hepatic parenchymal echogenicity as can be seen with hepatic steatosis. Pleural effusions. Electronically Signed   By: Lovey Newcomer M.D.   On: 04/23/2017 11:20     ASSESSMENT AND PLAN:    Active Problems:   Peritonitis ([place])  #1 history of fall, leukocytosis likely due to acute bronchitis and possible peritonitis. Patient has no abdominal pain, no purulent discharge from peritoneal catheter site,continue Antibiotics, WBC is coming down.Blood cultures negative so far.  Abdominal  pain: Abdomen ultrasound showed sludge within the gallbladder no signs of acute cholecystitis. Tolerated diet without nausea. No acute surgical consult is needed at this time. With surgery as an outpatient..  2 diabetes mellitus type 2: Uncontrolled. Due to steroids. Adjusted the Lantus, NovoLog. decrease  IV steroids today, breathing is better, less wheezing today..    #3 history of Crohn's disease,; patient is on Remicade, denies any issues with diarrhea or abdominal pain.   4.history of right frontal lobe stroke: Small petechial hemorrhages . Physical therapy consult. Continue statins, avoid aspirin for recent history of hemorrhage or therapy recommended home and physical therapy.;   odynophagia and dysphagia:started on nystatin swish and swallow, he says is helping him.        D/w wife,RN   All the records are reviewed and case discussed with Care Management/Social Workerr. Management plans discussed with the patient, family and they are in agreement.  CODE STATUS:full  TOTAL TIME TAKING CARE OF THIS PATIENT: 84mnutes.   POSSIBLE D/C IN 1-2 DAYS, DEPENDING ON CLINICAL CONDITION.   KEpifanio LeschesM.D on 04/24/2017 at 10:33 AM  Between 7am to 6pm - Pager - [phone]  After 6pm go to [email or web address] - password [email or web address] [facility]  Office  [phone] CC: Primary care physician; FBirdie Sons MD

## 2017-04-24 NOTE — Progress Notes (Signed)
PD STARTED

## 2017-04-24 NOTE — Progress Notes (Signed)
[facility]  ROUNDING NOTE   Subjective:   Peritoneal dialysis last night. Tolerated treatment. UF 378mL. Dextrose 2.5%  More alert and oriented today.   Objective:  Vital signs in last 24 hours:  Temp:  [98.1 F (36.7 C)-98.9 F (37.2 C)] 98.7 F (37.1 C) (07/08 0525) Pulse Rate:  [69-77] 71 (07/08 0525) Resp:  [19-20] 20 (07/08 0525) BP: (114-120)/(63-69) 120/69 (07/08 0525) SpO2:  [93 %-97 %] 93 % (07/08 0525) Weight:  [103.2 kg (227 lb 9.6 oz)] 103.2 kg (227 lb 9.6 oz) (07/08 0500)  Weight change: 2.767 kg (6 lb 1.6 oz) Filed Weights   04/22/17 0602 04/23/17 0624 04/24/17 0500  Weight: 101.3 kg (223 lb 4.8 oz) 100.5 kg (221 lb 8 oz) 103.2 kg (227 lb 9.6 oz)    Intake/Output: I/O last 3 completed shifts: In: 27 [P.O.:480; IV Piggyback:50] Out: 331 [Other:331]   Intake/Output this shift:  Total I/O In: -  Out: 333 [Other:333]  Physical Exam: General: NAD,   Head: Normocephalic, atraumatic. Moist oral mucosal membranes  Eyes: Anicteric, PERRL  Neck: Supple, trachea midline  Lungs:  Clear to auscultation  Heart: Regular rate and rhythm  Abdomen:  Soft, nontender,   Extremities: no peripheral edema.  Neurologic: Nonfocal, moving all four extremities  Skin: No lesions  Access: PD catheter    Basic Metabolic Panel:  Recent Labs Lab 04/21/17 1422 04/22/17 0546  NA 131* 133*  K 3.4* 3.3*  CL 89* 92*  CO2 24 25  GLUCOSE 481* 505*  BUN 76* 75*  CREATININE 11.52* 10.56*  CALCIUM 8.0* 8.4*    Liver Function Tests: No results for input(s): AST, ALT, ALKPHOS, BILITOT, PROT, ALBUMIN in the last 168 hours. No results for input(s): LIPASE, AMYLASE in the last 168 hours. No results for input(s): AMMONIA in the last 168 hours.  CBC:  Recent Labs Lab 04/21/17 1422 04/22/17 0546 04/22/17 1458 04/24/17 0509  WBC 32.4* 28.1* 25.0* 19.8*  HGB 8.8* 8.9* 8.9* 8.9*  HCT 27.8* 27.2* 27.9* 26.9*  MCV 83.5 83.0 82.7 83.3  PLT 272 267 294 260     Cardiac Enzymes: No results for input(s): CKTOTAL, CKMB, CKMBINDEX, TROPONINI in the last 168 hours.  BNP: Invalid input(s): POCBNP  CBG:  Recent Labs Lab 04/23/17 1206 04/23/17 1650 04/23/17 2120 04/24/17 0747 04/24/17 1138  GLUCAP 341* 217* 216* 329* 161*    Microbiology: Results for orders placed or performed during the hospital encounter of 04/21/17  Blood culture (routine x 2)     Status: None (Preliminary result)   Collection Time: 04/21/17  3:43 PM  Result Value Ref Range Status   Specimen Description BLOOD BLOOD LEFT HAND  Final   Special Requests   Final    BOTTLES DRAWN AEROBIC AND ANAEROBIC Blood Culture results may not be optimal due to an excessive volume of blood received in culture bottles   Culture NO GROWTH 3 DAYS  Final   Report Status PENDING  Incomplete  Blood culture (routine x 2)     Status: None (Preliminary result)   Collection Time: 04/21/17  3:43 PM  Result Value Ref Range Status   Specimen Description BLOOD RIGHT ANTECUBITAL  Final   Special Requests   Final    BOTTLES DRAWN AEROBIC AND ANAEROBIC Blood Culture results may not be optimal due to an excessive volume of blood received in culture bottles   Culture NO GROWTH 3 DAYS  Final   Report Status PENDING  Incomplete    Coagulation Studies:  No results for input(s): LABPROT, INR in the last 72 hours.  Urinalysis:  Recent Labs  04/21/17 1422  COLORURINE YELLOW*  LABSPEC 1.017  PHURINE 5.0  GLUCOSEU >=500*  HGBUR SMALL*  BILIRUBINUR NEGATIVE  KETONESUR NEGATIVE  PROTEINUR 100*  NITRITE NEGATIVE  LEUKOCYTESUR SMALL*      Imaging: Ct Head Wo Contrast  Result Date: 04/22/2017 CLINICAL DATA:  Increasing confusion over the past 24 hours. Abnormal head CT with a likely right frontal infarct. EXAM: CT HEAD WITHOUT CONTRAST TECHNIQUE: Contiguous axial images were obtained from the base of the skull through the vertex without intravenous contrast. COMPARISON:  Head CT scan 04/21/2017,  04/13/2017 and 11/06/2015. FINDINGS: Brain: Hypoattenuation the right frontal lobe is again seen. Scattered areas of peripheral hyperattenuation about the lesion are again seen and not notably changed since the most recent examination and likely represent a small amount of associated hemorrhage. No subdural hemorrhage is identified. No midline shift or hydrocephalus. Vascular: Age advanced atherosclerosis of the carotid siphons is noted. Skull: Negative. Sinuses/Orbits: Negative. Other: None. IMPRESSION: No change in the appearance of hypoattenuation in the right frontal lobe likely due to infarct with an associated small amount of hemorrhage. MRI with and without contrast could be used for confirmation. No new abnormality compared to the most recent exam. Electronically Signed   By: Inge Rise M.D.   On: 04/22/2017 15:01   US Abdomen Complete  Result Date: 04/23/2017 CLINICAL DATA:  Patient with abdominal pain for 1 week. EXAM: ABDOMEN ULTRASOUND COMPLETE COMPARISON:  Renal ultrasound 11/07/2015. FINDINGS: Gallbladder: Small amount of sludge within the gallbladder lumen. No gallbladder wall thickening or pericholecystic fluid. Negative sonographic Murphy sign. Common bile duct: Diameter: 4 mm Liver: Mildly increased in echogenicity. No focal lesion identified. IVC: No abnormality visualized. Pancreas: Visualized portion unremarkable. Spleen: Size and appearance within normal limits. Right Kidney: Length: 12.4 cm. Normal renal cortical thickness. Increased renal cortical echogenicity. 5 mm stone within the inferior pole. No hydronephrosis. Left Kidney: Length: 11.8 cm. Normal renal cortical thickness. Increased renal cortical echogenicity. No hydronephrosis. Abdominal aorta: No aneurysm visualized. Other findings: Bilateral pleural effusions. IMPRESSION: Possible small amount of sludge within the gallbladder lumen. No secondary signs to suggest acute cholecystitis. Echogenic kidneys bilaterally raising the  possibility of chronic medical renal disease. Mild increased hepatic parenchymal echogenicity as can be seen with hepatic steatosis. Pleural effusions. Electronically Signed   By: Lovey Newcomer M.D.   On: 04/23/2017 11:20     Medications:   . meropenem (MERREM) IV Stopped (04/23/17 1758)   . amLODipine  10 mg Oral Daily  . calcitRIOL  0.5 mcg Oral BID  . carvedilol  25 mg Oral BID  . ferric citrate  420 mg Oral TID WC  . folic acid  1 mg Oral Daily  . gentamicin cream  1 application Topical Daily  . insulin aspart  0-20 Units Subcutaneous TID WC  . insulin aspart  6 Units Subcutaneous TID WC  . insulin glargine  40 Units Subcutaneous Q2200  . nystatin  5 mL Mouth/Throat QID  . pantoprazole (PROTONIX) IV  40 mg Intravenous Q12H  . pravastatin  40 mg Oral Daily  . [START ON 04/25/2017] predniSONE  10 mg Oral Q breakfast  . sodium bicarbonate  650 mg Oral BID   acetaminophen **OR** acetaminophen, guaiFENesin-dextromethorphan, HYDROcodone-acetaminophen, ipratropium-albuterol, ondansetron **OR** ondansetron (ZOFRAN) IV  Assessment/ Plan:  [name] is a 56 y.o.  male [name] is a 56 y.o. black male  with End Stage Renal Disease on peritoneal dialysis, hypertension, insulin dependent diabetes mellitus type II, history of intracerebral hemorrhage, history fo DVT, crohn's disease on infliximab, seizure disorder who was admitted to [facility] on 04/21/2017   Peritoneal Dialysis Prescription CCPD 9 hours 2.5 litres 4 exchanges. No last fill  [facility] [place]   1. End Stage Renal Disease:  On peritoneal dialysis - Tolerated peritoneal dialysis last night. Orders prepared for tonight - on sodium bicarbonate  2. Abdominal pain: peritonitis versus constipation: Cell count from [place] shows 64 with wbc 76%.  Empiric meropenem.  - pending cultures from [place].  - started stool softeners.   3. Hypertension: blood pressure at goal.  - carvedilol,  amlodipine  4. Diabetes mellitus type II with chronic kidney disease: insulin dependent. Not well controlled. Hemoglobin A1c 10.2% on 04/13/17  5. Anemia of chronic kidney disease: Hemoglobin 8.9 - consult neurology to see if patient may receive erythropoietin due to recent hemorrhagic stroke.  - Restarted Auryxia  6. Secondary Hyperparathyroidism - Auryxia    LOS: [place], Kaid Seeberger 7/8/201811:51 AM

## 2017-04-24 NOTE — Progress Notes (Signed)
PD COMPLETED

## 2017-04-25 LAB — RENAL FUNCTION PANEL
Albumin: 1.8 g/dL — ABNORMAL LOW (ref 3.5–5.0)
Anion gap: 17 — ABNORMAL HIGH (ref 5–15)
BUN: 94 mg/dL — ABNORMAL HIGH (ref 6–20)
CO2: 23 mmol/L (ref 22–32)
Calcium: 8.3 mg/dL — ABNORMAL LOW (ref 8.9–10.3)
Chloride: 92 mmol/L — ABNORMAL LOW (ref 101–111)
Creatinine, Ser: 10 mg/dL — ABNORMAL HIGH (ref 0.61–1.24)
GFR calc Af Amer: 6 mL/min — ABNORMAL LOW (ref >60)
GFR calc non Af Amer: 5 mL/min — ABNORMAL LOW (ref >60)
Glucose, Bld: 400 mg/dL — ABNORMAL HIGH (ref 65–99)
Phosphorus: 10.5 mg/dL — ABNORMAL HIGH (ref 2.5–4.6)
Potassium: 3.4 mmol/L — ABNORMAL LOW (ref 3.5–5.1)
Sodium: 132 mmol/L — ABNORMAL LOW (ref 135–145)

## 2017-04-25 LAB — GLUCOSE, CAPILLARY
Glucose-Capillary: 293 mg/dL — ABNORMAL HIGH (ref 65–99)
Glucose-Capillary: 110 mg/dL — ABNORMAL HIGH (ref 65–99)

## 2017-04-25 MED ORDER — INSULIN ASPART 100 UNIT/ML ~~LOC~~ SOLN: 0.0000 [IU] | Freq: Three times a day (TID) | SUBCUTANEOUS | Status: DC

## 2017-04-25 MED ORDER — NYSTATIN 100000 UNIT/ML MT SUSP: 5.0000 mL | Freq: Four times a day (QID) | OROMUCOSAL | 0 refills | Status: DC

## 2017-04-25 MED ORDER — PREDNISONE 10 MG PO TABS: 10.0000 mg | ORAL_TABLET | Freq: Every day | ORAL | 0 refills | Status: DC

## 2017-04-25 MED ORDER — INSULIN GLARGINE 100 UNIT/ML ~~LOC~~ SOLN: 40.0000 [IU] | Freq: Every day | SUBCUTANEOUS | 11 refills | Status: DC

## 2017-04-25 MED ORDER — LINEZOLID 600 MG PO TABS: 600.0000 mg | ORAL_TABLET | Freq: Every day | ORAL | 0 refills | Status: DC

## 2017-04-25 NOTE — Progress Notes (Signed)
Physical Therapy Treatment Patient Details Name: [name] MRN: [id number] DOB: 1961-05-23 Today's Date: 04/25/2017    History of Present Illness 56 yo male had ischemic stroke and admitted for hemorrhagic progression noted after falling, had HD cath contamination and replaced this week.  Was intubated in [facility] after stroke, now cough and sore throat.  Uncontrolled DM blamed for progression of stroke.  Admitted to [facility] for elevated BS, concern for peritonitis and on aspiration precautions.  PMHx:  stroke, DM, DVT, HTN,     PT Comments    Pt agreeable to PT; denies pain. Pt up in chair on arrival; denies much difficulty with bed mobility. Observed to perform sit to/from stand transfer with Mod I using rolling walker. Mild lightheadedness noted with upright position that does not change throughout session. Pt progressing ambulation distance, speed and quality using rolling walker; pt notes he prefers to use rolling walker at this time versus cane or no AD. Pt demonstrates stand balance with stand exercises with occasional need for 1 -2 upper extremity support required. Stair climbing performed for 4 steps with step to pattern and need for 1 upper extremity support and Min guard on other side for confidence/safety descending and bilateral upper extremity support for ascending. Continue PT to progress strength, endurance, balance to improve functional mobility and provide an optimal, safe return home.    Follow Up Recommendations  Home health PT;Supervision for mobility/OOB     Equipment Recommendations  None recommended by PT    Recommendations for Other Services       Precautions / Restrictions Precautions Precautions: Fall Restrictions Weight Bearing Restrictions: No    Mobility  Bed Mobility               General bed mobility comments: Not tested; up in chair  Transfers Overall transfer level: Modified independent Equipment used: Rolling walker (2 wheeled) Transfers: Sit  to/from Stand Sit to Stand: Modified independent (Device/Increase time)         General transfer comment: Good use of hands; prefers to use rw   Ambulation/Gait Ambulation/Gait assistance: Supervision;Min guard (Min guard initially for safety due to subjective dizziness) Ambulation Distance (Feet): 360 Feet Assistive device: Rolling walker (2 wheeled) (mild trunk flexion) Gait Pattern/deviations: Step-through pattern;Decreased stride length;Trunk flexed   Gait velocity interpretation: Below normal speed for age/gender General Gait Details: Fluid cadence with mild descrease in stride and mild trunk flexion. No LOB, good safety awareness. Does not mild lightheadedness that does not worsen/improve throughout walk.    Stairs Stairs: Yes   Stair Management: One rail Right;Step to pattern;One rail Left;Forwards;Sideways Number of Stairs: 4 General stair comments: Leads with LLE descending with R rail; mild unsteadiness/weakness. Leads with RLE with 2 hands on L rail ascending with mild weakness, but improved from descending. Requires Min guard  Wheelchair Mobility    Modified Rankin (Stroke Patients Only)       Balance Overall balance assessment: Needs assistance Sitting-balance support: Feet supported Sitting balance-Leahy Scale: Good     Standing balance support: No upper extremity supported Standing balance-Leahy Scale: Fair (Good with BUE support)                              Cognition Arousal/Alertness: Awake/alert Behavior During Therapy: WFL for tasks assessed/performed Overall Cognitive Status: Within Functional Limits for tasks assessed  General Comments: Carries conversation      Exercises General Exercises - Lower Extremity Hip ABduction/ADduction: Strengthening;Both;10 reps;Standing (1-2 UE support as needed) Straight Leg Raises: Strengthening;Both;10 reps;Standing (1 UE support) Hip  Flexion/Marching: Strengthening;Both;20 reps;Standing (no UE support) Heel Raises: Strengthening;Both;10 reps;Standing (No UE support, Min guard) Other Exercises Other Exercises: B hip extension 1 UE support 10x each    General Comments        Pertinent Vitals/Pain Pain Assessment: No/denies pain    Home Living                      Prior Function            PT Goals (current goals can now be found in the care plan section) Progress towards PT goals: Progressing toward goals    Frequency    7X/week      PT Plan Current plan remains appropriate    Co-evaluation              AM-PAC PT "6 Clicks" Daily Activity  Outcome Measure  Difficulty turning over in bed (including adjusting bedclothes, sheets and blankets)?: None Difficulty moving from lying on back to sitting on the side of the bed? : A Little Difficulty sitting down on and standing up from a chair with arms (e.g., wheelchair, bedside commode, etc,.)?: None Help needed moving to and from a bed to chair (including a wheelchair)?: None Help needed walking in hospital room?: A Little Help needed climbing 3-5 steps with a railing? : A Little 6 Click Score: 21    End of Session Equipment Utilized During Treatment: Gait belt Activity Tolerance: Patient tolerated treatment well Patient left: in chair;with call bell/phone within reach;with family/visitor present (No alarm; will call for assist)   PT Visit Diagnosis: Hemiplegia and hemiparesis;Difficulty in walking, not elsewhere classified (R26.2);Unsteadiness on feet (R26.81) Hemiplegia - Right/Left: Left Hemiplegia - dominant/non-dominant: Non-dominant Hemiplegia - caused by: Nontraumatic intracerebral hemorrhage     Time: 1030-1106 PT Time Calculation (min) (ACUTE ONLY): 36 min  Charges:  $Gait Training: 8-22 mins $Therapeutic Exercise: 8-22 mins                    G Codes:        Larae Grooms, PTA 04/25/2017, 12:06 PM

## 2017-04-25 NOTE — Progress Notes (Signed)
[facility]  ROUNDING NOTE   Subjective:    Patient is much improved today.  He walks with physical therapist in the hallway.  He states he is able to eat without nausea or vomiting.  No fevers. Requesting to go home.  Objective:  Vital signs in last 24 hours:  Temp:  [98.1 F (36.7 C)-99.1 F (37.3 C)] 98.3 F (36.8 C) (07/09 1216) Pulse Rate:  [65-71] 65 (07/09 1216) Resp:  [20] 20 (07/09 0513) BP: (112-138)/(64-69) 113/68 (07/09 1216) SpO2:  [95 %-99 %] 99 % (07/09 1216)  Weight change:  Filed Weights   04/22/17 0602 04/23/17 0624 04/24/17 0500  Weight: 101.3 kg (223 lb 4.8 oz) 100.5 kg (221 lb 8 oz) 103.2 kg (227 lb 9.6 oz)    Intake/Output: I/O last 3 completed shifts: In: -  Out: 333 [Other:333]   Intake/Output this shift:  No intake/output data recorded.  Physical Exam: General: NAD,   Head: Normocephalic, atraumatic. Moist oral mucosal membranes  Eyes: Anicteric,  Neck: Supple,   Lungs:  Clear to auscultation  Heart: Regular rate and rhythm  Abdomen:  Soft,    Extremities: Trace to 1+ peripheral edema.  Neurologic: Nonfocal, ambulatory  Skin: No lesions  Access: PD catheter    Basic Metabolic Panel:  Recent Labs Lab 04/21/17 1422 04/22/17 0546 04/25/17 0417  NA 131* 133* 132*  K 3.4* 3.3* 3.4*  CL 89* 92* 92*  CO2 24 25 23   GLUCOSE 481* 505* 400*  BUN 76* 75* 94*  CREATININE 11.52* 10.56* 10.00*  CALCIUM 8.0* 8.4* 8.3*  PHOS  --   --  10.5*    Liver Function Tests:  Recent Labs Lab 04/25/17 0417  ALBUMIN 1.8*   No results for input(s): LIPASE, AMYLASE in the last 168 hours. No results for input(s): AMMONIA in the last 168 hours.  CBC:  Recent Labs Lab 04/21/17 1422 04/22/17 0546 04/22/17 1458 04/24/17 0509  WBC 32.4* 28.1* 25.0* 19.8*  HGB 8.8* 8.9* 8.9* 8.9*  HCT 27.8* 27.2* 27.9* 26.9*  MCV 83.5 83.0 82.7 83.3  PLT 272 267 294 260    Cardiac Enzymes: No results for input(s): CKTOTAL, CKMB, CKMBINDEX,  TROPONINI in the last 168 hours.  BNP: Invalid input(s): POCBNP  CBG:  Recent Labs Lab 04/24/17 1815 04/24/17 1930 04/24/17 2212 04/25/17 0736 04/25/17 1216  GLUCAP 84 123* 239* 293* 110*    Microbiology: Results for orders placed or performed during the hospital encounter of 04/21/17  Blood culture (routine x 2)     Status: None (Preliminary result)   Collection Time: 04/21/17  3:43 PM  Result Value Ref Range Status   Specimen Description BLOOD BLOOD LEFT HAND  Final   Special Requests   Final    BOTTLES DRAWN AEROBIC AND ANAEROBIC Blood Culture results may not be optimal due to an excessive volume of blood received in culture bottles   Culture NO GROWTH 4 DAYS  Final   Report Status PENDING  Incomplete  Blood culture (routine x 2)     Status: None (Preliminary result)   Collection Time: 04/21/17  3:43 PM  Result Value Ref Range Status   Specimen Description BLOOD RIGHT ANTECUBITAL  Final   Special Requests   Final    BOTTLES DRAWN AEROBIC AND ANAEROBIC Blood Culture results may not be optimal due to an excessive volume of blood received in culture bottles   Culture NO GROWTH 4 DAYS  Final   Report Status PENDING  Incomplete  Coagulation Studies: No results for input(s): LABPROT, INR in the last 72 hours.  Urinalysis: No results for input(s): COLORURINE, LABSPEC, PHURINE, GLUCOSEU, HGBUR, BILIRUBINUR, KETONESUR, PROTEINUR, UROBILINOGEN, NITRITE, LEUKOCYTESUR in the last 72 hours.  Invalid input(s): APPERANCEUR    Imaging: No results found.   Medications:   . meropenem (MERREM) IV Stopped (04/24/17 1717)   . amLODipine  10 mg Oral Daily  . calcitRIOL  0.5 mcg Oral BID  . carvedilol  25 mg Oral BID  . ferric citrate  420 mg Oral TID WC  . folic acid  1 mg Oral Daily  . gentamicin cream  1 application Topical Daily  . insulin aspart  0-15 Units Subcutaneous TID WC  . insulin aspart  6 Units Subcutaneous TID WC  . insulin glargine  40 Units Subcutaneous  Q2200  . nystatin  5 mL Mouth/Throat QID  . pantoprazole (PROTONIX) IV  40 mg Intravenous Q12H  . pravastatin  40 mg Oral Daily  . predniSONE  10 mg Oral Q breakfast  . sodium bicarbonate  650 mg Oral BID   acetaminophen **OR** acetaminophen, guaiFENesin-dextromethorphan, HYDROcodone-acetaminophen, ipratropium-albuterol, ondansetron **OR** ondansetron (ZOFRAN) IV  Assessment/ Plan:  [name] is a 56 y.o.  male [name] is a 56 y.o. black male with End Stage Renal Disease on peritoneal dialysis, hypertension, insulin dependent diabetes mellitus type II, history of intracerebral hemorrhage, history fo DVT, crohn's disease on infliximab, seizure disorder who was admitted to [facility] on 04/21/2017   Peritoneal Dialysis Prescription CCPD 9 hours 2.5 litres 4 exchanges. No last fill  [facility] [place]   1. End Stage Renal Disease:  On peritoneal dialysis -  Patient is expected to discharge today.  He will continue his routine prescription at home.  2. Abdominal pain: peritonitis versus constipation: Cell count from [place] shows 64 with wbc 76%.  Outpatient preliminary microbiology reports showed growth of Staphylococcus hemolyticus.  It is sensitive to gentamicin, linezolid, rifampin and vancomycin.  It is resistant to other antibiotics on the panel.  Patient is allergic to vancomycin therefore discussed with Dr Vianne Bulls to use linezolid  3.  Diabetes mellitus type II with chronic kidney disease: insulin dependent. Not well controlled. Hemoglobin A1c 10.2% on 04/13/17  4. Anemia of chronic kidney disease: Hemoglobin 8.9 - probably should hold EPO for a few days to weeks given recent stroke - Restarted Auryxia  5. Secondary Hyperparathyroidism - Auryxia as binder    LOS: 4 [name] 7/9/20185:41 PM

## 2017-04-25 NOTE — Care Management (Signed)
Patient admitted with Peritonitis.  Patient lives with wife.  PCP [name].  Patient has RW in the home for ambulation.  Patient open with [facility] home health.  [place] with [facility] notified. Resumption orders placed for RN and PT.  Elvera Bicker dialysis liaison notified of discharge.  RNCM signing off.

## 2017-04-25 NOTE — Progress Notes (Signed)
Inpatient Diabetes Program Recommendations  AACE/ADA: New Consensus Statement on Inpatient Glycemic Control (2015)  Target Ranges:  Prepandial:   less than 140 mg/dL      Peak postprandial:   less than 180 mg/dL (1-2 hours)      Critically ill patients:  140 - 180 mg/dL   Lab Results  Component Value Date   GLUCAP 293 (H) 04/25/2017   HGBA1C 10.1 (H) 04/21/2017    Review of Glycemic Control  Results for Brady, [name] AMBLE (MRN [id number]) as of 04/25/2017 09:47  Ref. Range 04/24/2017 16:39 04/24/2017 18:15 04/24/2017 19:30 04/24/2017 22:12 04/25/2017 07:36  Glucose-Capillary Latest Ref Range: 65 - 99 mg/dL 71 84 123 (H) 239 (H) 293 (H)    History: DM  Home DM Meds: Lantus 18 units QHS (per recent discharge summary from [facility]- Was taking Lantus 45 units daily per PCP notes)                             Novolog 6 units TID (per recent discharge summary from [facility]- Was taking 8-15 units TID wit meals per PCP notes)  Current Insulin Orders: Lantus 40 units QHS                                       Novolog Resistant Correction Scale/ SSI (0-20 units) TID AC                                       Novolog 6 units TID with meals  Elevated CBG this am because wife and patient refused suppertime insulin.   Please decrease Novolog correction to moderate correction scale 0-15 units tid.   Continue all other insulins as ordered.   Gentry Fitz, RN, BA, MHA, CDE Diabetes Coordinator Inpatient Diabetes Program  [phone] (Team Pager) [phone] ([place]) 04/25/2017 9:50 AM

## 2017-04-25 NOTE — Progress Notes (Signed)
[place] [facility] [place] NAME: [name]    MR#:  [id number]  DATE OF BIRTH:  13-Jan-1961  SUBJECTIVE: seen at bedside, he is doing better denies any complaints. Waiting for culture data from [place]:   Chief Complaint  Patient presents with  . Hyperglycemia    REVIEW OF SYSTEMS:    Review of Systems  Constitutional: Negative for chills and fever.  HENT: Negative for hearing loss and sore throat.   Eyes: Negative for blurred vision, double vision and photophobia.  Respiratory: Negative for cough, hemoptysis, sputum production and shortness of breath.   Cardiovascular: Negative for palpitations, orthopnea and leg swelling.  Gastrointestinal: Negative for abdominal pain, diarrhea, heartburn and vomiting.  Genitourinary: Negative for dysuria and urgency.  Musculoskeletal: Negative for myalgias and neck pain.  Skin: Negative for rash.  Neurological: Negative for dizziness, focal weakness, seizures, weakness and headaches.  Psychiatric/Behavioral: Negative for memory loss. The patient does not have insomnia.     Nutrition:  Tolerating Diet: Tolerating PT:      DRUG ALLERGIES:   Allergies  Allergen Reactions  . Vancomycin Shortness Of Breath    Eyes watering, SOB, wheezing  . Methotrexate Other (See Comments)    Blood count drops  . Tape     VITALS:  Blood pressure 112/64, pulse 67, temperature 99.1 F (37.3 C), temperature source Oral, resp. rate 20, height 5' 11"  (1.803 m), weight 103.2 kg (227 lb 9.6 oz), SpO2 95 %.  PHYSICAL EXAMINATION:   Physical Exam  GENERAL:  56 y.o.-year-old patient lying in the bed with no acute distress. Over all  Ill-looking male. EYES: Pupils equal, round, reactive to light and accommodation. No scleral icterus. Extraocular muscles intact.  HEENT: Head atraumatic, normocephalic. Oropharynx and nasopharynx clear.  NECK:  Supple, no jugular venous distention. No thyroid  enlargement, no tenderness.  LUNGS: Normal breath sounds bilaterally, no wheezing, rales,rhonchi or crepitation. No use of accessory muscles of respiration.  CARDIOVASCULAR: S1, S2 normal. No murmurs, rubs, or gallops.  ABDOMEN: Soft, nontender, nondistended. Bowel sounds present. No organomegaly or mass. Peritoneal catheter site is clean. EXTREMITIES: No pedal edema, cyanosis, or clubbing.  NEUROLOGIC: Cranial nerves II through XII are intact. Muscle strength 5/5 in all extremities. Sensation intact. Gait not checked.  PSYCHIATRIC: The patient is alert and oriented x 3.  SKIN: No obvious rash, lesion, or ulcer.    LABORATORY PANEL:   CBC  Recent Labs Lab 04/24/17 0509  WBC 19.8*  HGB 8.9*  HCT 26.9*  PLT 260   ------------------------------------------------------------------------------------------------------------------  Chemistries   Recent Labs Lab 04/25/17 0417  NA 132*  K 3.4*  CL 92*  CO2 23  GLUCOSE 400*  BUN 94*  CREATININE 10.00*  CALCIUM 8.3*   ------------------------------------------------------------------------------------------------------------------  Cardiac Enzymes No results for input(s): TROPONINI in the last 168 hours. ------------------------------------------------------------------------------------------------------------------  RADIOLOGY:  No results found.   ASSESSMENT AND PLAN:   Active Problems:   Peritonitis ([place])  #1 history of fall, leukocytosis likely due to acute bronchitis and possible peritonitis. Patient has no abdominal pain, no purulent discharge from peritoneal catheter site,continue Antibiotics, WBC is coming down.Blood cultures negative so far.Blood cultures drawn peritoneal fluid, waiting for that information, cell count from diabetes at 64 with WBC 76%. He is on cefepime. Spoke with Dr. Candiss Norse who will update me, then we can plan for discharge.     Abdominal  pain: Abdomen ultrasound showed sludge within the  gallbladder no  signs of acute cholecystitis. Tolerated diet without nausea. No acute surgical consult is needed at this time. With surgery as an outpatient..  2 diabetes mellitus type 2: better now . Adjusted the Lantus, NovoLog.    #3 history of Crohn's disease,; patient is on Remicade, denies any issues with diarrhea or abdominal pain.   4.history of right frontal lobe stroke: Small petechial hemorrhages . Physical therapy consult. Continue statins, avoid aspirin for recent history of hemorrhage or therapy recommended home and physical therapy.;   odynophagia and dysphagia:started on nystatin swish and swallow, he says is helping him.    Likely discharge either today or tomorrow depending on culture data [place]    D/w wife,RN   All the records are reviewed and case discussed with Care Management/Social Workerr. Management plans discussed with the patient, family and they are in agreement.  CODE STATUS:full  TOTAL TIME TAKING CARE OF THIS PATIENT: 65mnutes.   POSSIBLE D/C IN 1-2 DAYS, DEPENDING ON CLINICAL CONDITION.   KEpifanio LeschesM.D on 04/25/2017 at 11:50 AM  Between 7am to 6pm - Pager - [phone]  After 6pm go to [email or web address] - password [email or web address] [facility]  Office  [phone] CC: Primary care physician; FBirdie Sons MD

## 2017-04-25 NOTE — Progress Notes (Signed)
Peritoneal fluid culturesfrom showed staph hemolyticus.pt has allergy to vanco;so we will give him zyvox fro 7 days and discharge him today.

## 2017-04-26 ENCOUNTER — Telehealth: Payer: Self-pay | Admitting: Family Medicine

## 2017-04-26 LAB — CULTURE, BLOOD (ROUTINE X 2)
Culture: NO GROWTH
Culture: NO GROWTH

## 2017-04-26 NOTE — Telephone Encounter (Signed)
Pt daughter called asking why the hospital told them to stop giving him tylenol.  She states he has been taking tylenol for a long time for pain as needed.  Their call back is [phone]  Thanks teri

## 2017-04-27 ENCOUNTER — Telehealth: Payer: Self-pay | Admitting: Family Medicine

## 2017-04-27 NOTE — Telephone Encounter (Signed)
Stephanie with [facility] requesting verbal order for OT  1 time for 1 week and 2 times a week for 4 weeks.  Please advise  teri

## 2017-04-27 NOTE — Telephone Encounter (Signed)
Advised daughter as below.

## 2017-04-27 NOTE — Telephone Encounter (Signed)
He should NOT take any anti-inflammatory medications such as advil, motrin, or aleve. but I don't see any reason that he can't take regular Tylenol.

## 2017-04-27 NOTE — Telephone Encounter (Signed)
Dr Caryn Section, Not sure if you needed to see this info or not.  Just an FYI to be saved to the chart?

## 2017-04-27 NOTE — Telephone Encounter (Signed)
Please review. Thanks!  

## 2017-04-27 NOTE — Telephone Encounter (Signed)
Message only.  Pt is enrolled in case mgmt services and recently discharge [place].  Just FYI  Thanks [employer]

## 2017-04-28 ENCOUNTER — Telehealth: Payer: Self-pay | Admitting: Family Medicine

## 2017-04-28 NOTE — Telephone Encounter (Signed)
Please review. Thanks!  

## 2017-04-28 NOTE — Telephone Encounter (Signed)
Please review-aa 

## 2017-04-28 NOTE — Telephone Encounter (Signed)
ok 

## 2017-04-28 NOTE — Telephone Encounter (Signed)
Stephanie advised-aa 

## 2017-04-28 NOTE — Telephone Encounter (Signed)
Nikki with [facility] called for speech 1 time a week for 3 weeks.  Please advise  Thanks, Con Memos

## 2017-04-28 NOTE — Discharge Summary (Signed)
[name], is a 56 y.o. male  DOB 05-17-1961  MRN [id number].  Admission date:  04/21/2017  Admitting Physician  Dustin Flock, MD  Discharge Date:  04/28/2017   Primary MD  [name], MD  Recommendations for primary care physician for things to follow:   Follow-up with PCP in 1 week   Admission Diagnosis  Hyperglycemia [R73.9] End-stage renal disease on peritoneal dialysis ([place]) [N18.6, Z99.2] Leukocytosis, unspecified type [D72.829]   Discharge Diagnosis  Hyperglycemia [R73.9] End-stage renal disease on peritoneal dialysis ([place]) [N18.6, Z99.2] Leukocytosis, unspecified type [D72.829]   Active Problems:   Peritonitis [facility])      Past Medical History:  Diagnosis Date  . Anemia   . Crohn disease ([place])   . Diabetes mellitus without complication ([place])   . DVT of lower extremity (deep venous thrombosis) ([place]) 2016  . Hidradenitis suppurativa   . Hypertension   . ICH (intracerebral hemorrhage) ([place])   . Renal disorder     Past Surgical History:  Procedure Laterality Date  . ABDOMINAL SURGERY    . KNEE SURGERY Left 02/04/2016   [facility]       History of present illness and  Hospital Course:     Kindly see H&P for history of present illness and admission details, please review complete Labs, Consult reports and Test reports for all details in brief  HPI  from the history and physical done on the day of admission [name]  is a 56 y.o. male with a known history of Diabetes type 2, Crohn's disease, end-stage renal disease on pericardial dialysis was actually hospitalized at [facility] and discharged last week after he was transferred there for intracranial hemorrhage which was conservatively treated with medical management. Patient apparently fell on Tuesday on his abdomen. UAccording to him. Now he is coming  to the emergency room because his blood sugars are noted to be high at home. Patient's WBC count is noted to be significantly elevated. There is concern for peritonitis. Patient does complain of nausea and some vomiting and diarrhea but no abdominal pain. Did not have any fevers at home. Denies any chest pain or palpitations. Does complain of some chest congestion   Hospital Course   Abdominal pain with infected dialysis catheter, patient had dialysis catheter changed at [facility] on July 4, cultures were sent from old catheter site and he was given empiric Fortaz and Keflex. And he had a new catheter placed. Patient was at [facility] from June 27 to July 3 for hemorrhagic conversion of ischemic stroke. Patient had peritoneal fluid culture showing staph hemolyticus from  [facility] dialysis center, discharged home with Zyvox as per nephrology instructions. Discharged with Zyvox 600 MG daily for 7 days.   #2. hyperglycemia due to uncontrolled diabetes mellitus; blood sugars were in 300s to 400 range when he is admitted, seen by diabetic coordinator, blood sugars were improved at the time of discharge, blood sugar 110 at the time of discharge. patient is on Lantus 40 units daily at bedtime, NovoLog 6 units 3 times a day,Novolog correction to moderate correction scale 0-15 units tid.   3.history of Crohn's disease,; patient is on Remicade, denies any issues with diarrhea or abdominal pain.   4.history of right frontal lobe stroke: Small petechial hemorrhages . Physical therapy consulted.. Continue statins, avoid aspirin for recent history of hemorrhage or therapy recommended home and physical therapy.; Discharge home with home health physical therapy.   odynophagia and dysphagia:started on nystatin swish and  swallow,    #5/reactive airway disease with wheezing, received IV steroids, bronchodilators, discharged with tapering course of prednisone. Wheezing improved after starting nebulizers, steroids. She  said that he had shortness of breath, trouble swallowing after extubation from recent stay at [facility]. Chest x-ray did not show any pneumonia.   Discharge Condition: stable   Follow UP  Follow-up Information    [name], MD. Go on 04/29/2017.   Specialty:  Family Medicine Why:  You already have a appointment April 23, 2017 at 11:00am. Contact information: [address] [place] [address] [phone]             Discharge Instructions  and  Discharge Medications     Discharge Instructions    Face-to-face encounter (required for Medicare/Medicaid patients)    Complete by:  As directed    I Valrie Jia certify that this patient is under my care and that I, or a nurse practitioner or physician's assistant working with me, had a face-to-face encounter that meets the physician face-to-face encounter requirements with this patient on 04/25/2017. The encounter with the patient was in whole, or in part for the following medical condition(s) which is the primary reason for home health care  htn  Subacute CVA dmii Peritoneal dialysis   The encounter with the patient was in whole, or in part, for the following medical condition, which is the primary reason for home health care:  whole   I certify that, based on my findings, the following services are medically necessary home health services:   Nursing Physical therapy     Reason for Medically Necessary Home Health Services:  Therapy- Personnel officer, Public librarian   My clinical findings support the need for the above services:  Unable to leave home safely without assistance and/or assistive device   Further, I certify that my clinical findings support that this patient is homebound due to:  Unable to leave home safely without assistance   Home Health    Complete by:  As directed    To provide the following care/treatments:   PT RN       Allergies as of 04/25/2017      Reactions    Vancomycin Shortness Of Breath   Eyes watering, SOB, wheezing   Methotrexate Other (See Comments)   Blood count drops   Tape       Medication List    STOP taking these medications   acetaminophen 500 MG tablet Commonly known as:  TYLENOL   LANTUS SOLOSTAR 100 UNIT/ML Solostar Pen Generic drug:  Insulin Glargine Replaced by:  insulin glargine 100 UNIT/ML injection     TAKE these medications   amLODipine 10 MG tablet Commonly known as:  NORVASC TAKE ONE TABLET BY MOUTH ONCE DAILY   aspirin EC 81 MG tablet Take 81 mg by mouth daily.   atorvastatin 80 MG tablet Commonly known as:  LIPITOR Take 80 mg by mouth daily.   calcitRIOL 0.25 MCG capsule Commonly known as:  ROCALTROL Take 0.5 mcg by mouth daily.   carvedilol 25 MG tablet Commonly known as:  COREG Take 25 mg by mouth 2 (two) times daily.   furosemide 80 MG tablet Commonly known as:  LASIX Take 80 mg by mouth 2 (two) times daily.   glucose blood test strip Commonly known as:  BAYER CONTOUR TEST Check sugar three times daily for insulin dependent diabetes   inFLIXimab 100 MG injection Commonly known as:  REMICADE Inject into the  vein every 6 (six) weeks.   insulin aspart 100 UNIT/ML FlexPen Commonly known as:  NOVOLOG FLEXPEN Take 8-15 units three times a day before each meal What changed:  how much to take  how to take this  when to take this  additional instructions   insulin glargine 100 UNIT/ML injection Commonly known as:  LANTUS Inject 0.4 mLs (40 Units total) into the skin daily at 10 pm. Replaces:  LANTUS SOLOSTAR 100 UNIT/ML Solostar Pen   levETIRAcetam 1000 MG tablet Commonly known as:  KEPPRA Take 1,000 mg by mouth at bedtime.   linezolid 600 MG tablet Commonly known as:  ZYVOX Take 1 tablet (600 mg total) by mouth daily.   lisinopril 40 MG tablet Commonly known as:  PRINIVIL,ZESTRIL Take 40 mg by mouth daily.   nystatin 100000 UNIT/ML suspension Commonly known as:   MYCOSTATIN Use as directed 5 mLs (500,000 Units total) in the mouth or throat 4 (four) times daily.   predniSONE 10 MG tablet Commonly known as:  DELTASONE Take 1 tablet (10 mg total) by mouth daily with breakfast.   sildenafil 100 MG tablet Commonly known as:  VIAGRA Take 1 tablet (100 mg total) by mouth daily as needed.   sodium bicarbonate 650 MG tablet Take 650 mg by mouth 2 (two) times daily.         Diet and Activity recommendation: See Discharge Instructions above   Consults obtained - nephrology   Major procedures and Radiology Reports - PLEASE review detailed and final reports for all details, in brief -      Dg Chest 2 View  Result Date: 04/21/2017 CLINICAL DATA:  56 year old male with history of hyperglycemia and vomiting. Diarrhea earlier days. EXAM: CHEST  2 VIEW COMPARISON:  Chest x-ray 04/13/2017. FINDINGS: There is cephalization of the pulmonary vasculature and slight indistinctness of the interstitial markings suggestive of mild pulmonary edema. Trace bilateral pleural effusions. Mild cardiomegaly. Upper mediastinal contours are within normal limits. Aortic atherosclerosis. IMPRESSION: 1. The appearance of the chest suggests mild congestive heart failure, as above. Electronically Signed   By: Vinnie Langton M.D.   On: 04/21/2017 15:41   Dg Ribs Unilateral W/chest Left  Result Date: 03/31/2017 CLINICAL DATA:  Patient fell while chasing a dog today. Pain to the left ribs EXAM: LEFT RIBS AND CHEST - 3+ VIEW COMPARISON:  None. FINDINGS: The lungs are clear without focal pneumonia, edema, pneumothorax or pleural effusion. Interstitial markings are diffusely coarsened with chronic features. The cardiopericardial silhouette is within normal limits for size. Oblique views of the left ribs the show nondisplaced fracture of the left sixth rib. IMPRESSION: Nondisplaced fracture left sixth rib. Electronically Signed   By: Misty Stanley M.D.   On: 03/31/2017 19:03   Ct Head  Wo Contrast  Result Date: 04/22/2017 CLINICAL DATA:  Increasing confusion over the past 24 hours. Abnormal head CT with a likely right frontal infarct. EXAM: CT HEAD WITHOUT CONTRAST TECHNIQUE: Contiguous axial images were obtained from the base of the skull through the vertex without intravenous contrast. COMPARISON:  Head CT scan 04/21/2017, 04/13/2017 and 11/06/2015. FINDINGS: Brain: Hypoattenuation the right frontal lobe is again seen. Scattered areas of peripheral hyperattenuation about the lesion are again seen and not notably changed since the most recent examination and likely represent a small amount of associated hemorrhage. No subdural hemorrhage is identified. No midline shift or hydrocephalus. Vascular: Age advanced atherosclerosis of the carotid siphons is noted. Skull: Negative. Sinuses/Orbits: Negative. Other: None. IMPRESSION: No change in  the appearance of hypoattenuation in the right frontal lobe likely due to infarct with an associated small amount of hemorrhage. MRI with and without contrast could be used for confirmation. No new abnormality compared to the most recent exam. Electronically Signed   By: Inge Rise M.D.   On: 04/22/2017 15:01   Ct Head Wo Contrast  Result Date: 04/21/2017 CLINICAL DATA:  56 year old male with a history of prior stroke with admission at [place]. Headache EXAM: CT HEAD WITHOUT CONTRAST TECHNIQUE: Contiguous axial images were obtained from the base of the skull through the vertex without intravenous contrast. COMPARISON:  04/13/2017, 11/06/2015 FINDINGS: Brain: Re- demonstration of confluent hypodensity in the right frontal lobe in this patient with known prior stroke. Gyriform hyperdensity with configuration of the frontal gyration. No definite evidence of acute subarachnoid hemorrhage, subdural hemorrhage, or epidural hemorrhage. No local mass effect. Unchanged configuration of the ventricles. Remainder of the gray-white differentiation maintained.  Vascular: Calcifications of the anterior circulation. Skull: No displaced fracture. Sinuses/Orbits: Unremarkable Other: None IMPRESSION: Re- demonstration of right frontal hypodensity, presumed to represent evolving subacute/ chronic infarction in this patient with given clinical history of prior stroke. The gyriform hyperdensity may represent evolving cortical necrosis/ mineralization, although superimposed small volume petechial hemorrhage not excluded. No evidence of subarachnoid hemorrhage or mass effect. Intracranial atherosclerosis. Electronically Signed   By: Corrie Mckusick D.O.   On: 04/21/2017 16:29   Ct Head Wo Contrast  Result Date: 04/13/2017 CLINICAL DATA:  Status epilepticus. Altered mental status. Fall 1 week ago. EXAM: CT HEAD WITHOUT CONTRAST TECHNIQUE: Contiguous axial images were obtained from the base of the skull through the vertex without intravenous contrast. COMPARISON:  CT head 11/06/2015 FINDINGS: Brain: Right frontal lobe hypodensity and involving cortex and white matter. Scattered small areas of mild hyper density within the hypodensity likely due to hemorrhagic infarction. Mass lesion considered less likely. Ventricle size normal. No shift of the midline structure. No other acute infarct. Vascular: Negative for hyperdense vessel. Skull: Negative Sinuses/Orbits: Mucosal edema paranasal sinuses.  Normal orbit Other: Non IMPRESSION: Right frontal hypodensity most compatible with subacute infarct and superimposed mild hemorrhage. MRI brain without with contrast suggested confirm and to exclude underlying mass lesion. These results were called by telephone at the time of interpretation on 04/13/2017 at 2:49 pm to Dr. Carrie Mew , who verbally acknowledged these results. Electronically Signed   By: Franchot Gallo M.D.   On: 04/13/2017 14:49   US Abdomen Complete  Result Date: 04/23/2017 CLINICAL DATA:  Patient with abdominal pain for 1 week. EXAM: ABDOMEN ULTRASOUND COMPLETE  COMPARISON:  Renal ultrasound 11/07/2015. FINDINGS: Gallbladder: Small amount of sludge within the gallbladder lumen. No gallbladder wall thickening or pericholecystic fluid. Negative sonographic Murphy sign. Common bile duct: Diameter: 4 mm Liver: Mildly increased in echogenicity. No focal lesion identified. IVC: No abnormality visualized. Pancreas: Visualized portion unremarkable. Spleen: Size and appearance within normal limits. Right Kidney: Length: 12.4 cm. Normal renal cortical thickness. Increased renal cortical echogenicity. 5 mm stone within the inferior pole. No hydronephrosis. Left Kidney: Length: 11.8 cm. Normal renal cortical thickness. Increased renal cortical echogenicity. No hydronephrosis. Abdominal aorta: No aneurysm visualized. Other findings: Bilateral pleural effusions. IMPRESSION: Possible small amount of sludge within the gallbladder lumen. No secondary signs to suggest acute cholecystitis. Echogenic kidneys bilaterally raising the possibility of chronic medical renal disease. Mild increased hepatic parenchymal echogenicity as can be seen with hepatic steatosis. Pleural effusions. Electronically Signed   By: Lovey Newcomer M.D.   On:  04/23/2017 11:20   Dg Chest Portable 1 View  Result Date: 04/13/2017 CLINICAL DATA:  Intubation. EXAM: PORTABLE CHEST 1 VIEW COMPARISON:  03/31/2017. FINDINGS: Endotracheal tube noted with tip 2.6 cm above the carina. NG tube noted with tip below left hemidiaphragm. Mild gastric distention. Heart size normal. No focal infiltrate. No pleural effusion or pneumothorax . IMPRESSION: 1. Endotracheal tube noted with tip 2.6 cm above the carina. NG tube noted with tip projected over the stomach. Mild gastric distention. 2. No acute cardiopulmonary disease . Electronically Signed   By: Marcello Moores  Register   On: 04/13/2017 14:22    Micro Results     Recent Results (from the past 240 hour(s))  Blood culture (routine x 2)     Status: None   Collection Time: 04/21/17   3:43 PM  Result Value Ref Range Status   Specimen Description BLOOD BLOOD LEFT HAND  Final   Special Requests   Final    BOTTLES DRAWN AEROBIC AND ANAEROBIC Blood Culture results may not be optimal due to an excessive volume of blood received in culture bottles   Culture NO GROWTH 5 DAYS  Final   Report Status 04/26/2017 FINAL  Final  Blood culture (routine x 2)     Status: None   Collection Time: 04/21/17  3:43 PM  Result Value Ref Range Status   Specimen Description BLOOD RIGHT ANTECUBITAL  Final   Special Requests   Final    BOTTLES DRAWN AEROBIC AND ANAEROBIC Blood Culture results may not be optimal due to an excessive volume of blood received in culture bottles   Culture NO GROWTH 5 DAYS  Final   Report Status 04/26/2017 FINAL  Final       Today   Subjective:   [name] today has no headache,no chest abdominal pain,no new weakness tingling or numbness, feels much better wants to go home today.   Objective:   Blood pressure 113/68, pulse 65, temperature 98.3 F (36.8 C), temperature source Oral, resp. rate 20, height 5' 11"  (1.803 m), weight 103.2 kg (227 lb 9.6 oz), SpO2 99 %.  No intake or output data in the 24 hours ending 04/28/17 1413  Exam Awake Alert, Oriented x 3, No new F.N deficits, Normal affect [place].AT,PERRAL Supple Neck,No JVD, No cervical lymphadenopathy appriciated.  Symmetrical Chest wall movement, Good air movement bilaterally, CTAB RRR,No Gallops,Rubs or new Murmurs, No Parasternal Heave +ve B.Sounds, Abd Soft, Non tender, No organomegaly appriciated, No rebound -guarding or rigidity. No Cyanosis, Clubbing or edema, No new Rash or bruise  Data Review   CBC w Diff:  Lab Results  Component Value Date   WBC 19.8 (H) 04/24/2017   HGB 8.9 (L) 04/24/2017   HGB 8.8 (L) 07/24/2014   HCT 26.9 (L) 04/24/2017   HCT 29.2 (L) 07/24/2014   PLT 260 04/24/2017   PLT 279 07/24/2014   LYMPHOPCT 4 04/13/2017   LYMPHOPCT 9.0 07/24/2014   MONOPCT 3  04/13/2017   MONOPCT 5.1 07/24/2014   EOSPCT 0 04/13/2017   EOSPCT 0.1 07/24/2014   BASOPCT 0 04/13/2017   BASOPCT 0.3 07/24/2014    CMP:  Lab Results  Component Value Date   NA 132 (L) 04/25/2017   NA 134 (L) 07/24/2014   K 3.4 (L) 04/25/2017   K 4.3 07/24/2014   CL 92 (L) 04/25/2017   CL 103 07/24/2014   CO2 23 04/25/2017   CO2 21 07/24/2014   BUN 94 (H) 04/25/2017   BUN 62 (H) 07/24/2014  CREATININE 10.00 (H) 04/25/2017   CREATININE 4.95 (H) 07/24/2014   PROT 7.9 04/13/2017   ALBUMIN 1.8 (L) 04/25/2017   BILITOT 0.7 04/13/2017   ALKPHOS 90 04/13/2017   AST 19 04/13/2017   ALT 22 04/13/2017  .   Total Time in preparing paper work, data evaluation and todays exam - 62 minutes  Camri Molloy M.D on 04/25/2017 at 2:13 PM    Note: This dictation was prepared with Dragon dictation along with smaller phrase technology. Any transcriptional errors that result from this process are unintentional.

## 2017-04-28 NOTE — Telephone Encounter (Signed)
OK 

## 2017-04-29 ENCOUNTER — Encounter: Payer: Self-pay | Admitting: Family Medicine

## 2017-04-29 ENCOUNTER — Ambulatory Visit (INDEPENDENT_AMBULATORY_CARE_PROVIDER_SITE_OTHER): Payer: BLUE CROSS/BLUE SHIELD | Admitting: Family Medicine

## 2017-04-29 VITALS — BP 100/60 | HR 71 | Temp 98.7°F | Resp 18 | Ht 71.0 in | Wt 218.0 lb

## 2017-04-29 DIAGNOSIS — K659 Peritonitis, unspecified: Secondary | ICD-10-CM

## 2017-04-29 DIAGNOSIS — I638 Other cerebral infarction: Secondary | ICD-10-CM | POA: Diagnosis not present

## 2017-04-29 DIAGNOSIS — N185 Chronic kidney disease, stage 5: Secondary | ICD-10-CM

## 2017-04-29 DIAGNOSIS — R569 Unspecified convulsions: Secondary | ICD-10-CM | POA: Diagnosis not present

## 2017-04-29 DIAGNOSIS — D649 Anemia, unspecified: Secondary | ICD-10-CM

## 2017-04-29 DIAGNOSIS — Z794 Long term (current) use of insulin: Secondary | ICD-10-CM

## 2017-04-29 DIAGNOSIS — I6389 Other cerebral infarction: Secondary | ICD-10-CM

## 2017-04-29 DIAGNOSIS — E1129 Type 2 diabetes mellitus with other diabetic kidney complication: Secondary | ICD-10-CM | POA: Diagnosis not present

## 2017-04-29 NOTE — Progress Notes (Signed)
Patient: [name] Male    DOB: Aug 29, 1961   56 y.o.   MRN: [id number] Visit Date: 04/29/2017  Today's Provider: [name], MD   Chief Complaint  Patient presents with  . Hospitalization Follow-up   Subjective:    HPI  Follow up Hospitalization  Patient was admitted to [facility] on 04/21/2017 and discharged on 04/25/2017. He was treated for hyperglycemia and found to have Peritonitis. Lab Results  Component Value Date   HGBA1C 10.1 (H) 04/21/2017  He reports he presented to hospital due to high blood sugars, but was also having abdominal pains which have since resolved.  Treatment for this included prescribing Zyvox, Nystatin and Prednisone. Home health was also ordered upon discharge.  He had previously been admitted to [facility] on 04/13/2017 after presenting to [facility] for seizure with suspect right frontal ICH.  Seizure was managed with fosphenytoic and converted 1gram daily keppra anticipating outpatient neurology follow up. However he was readmitted as above before neurology follow up was arranged.  He reports good compliance with treatment. He reports this condition is Improved. Patient currently takes 34 units daily of Lantus.  He reports his random blood sugars are averaging around 200's, but states they have been consistently above 300 fasting before taking insulin.  He continues to feel very fatigued, but no coughing, shortness of breath, abdominal pains, fevers, chills or sweats.  ------------------------------------------------------------------------------------       Allergies  Allergen Reactions  . Vancomycin Shortness Of Breath    Eyes watering, SOB, wheezing  . Methotrexate Other (See Comments)    Blood count drops  . Tape      Current Outpatient Prescriptions:  .  amLODipine (NORVASC) 10 MG tablet, TAKE ONE TABLET BY MOUTH ONCE DAILY, Disp: 30 tablet, Rfl: 0 .  aspirin EC 81 MG tablet, Take 81 mg by mouth daily., Disp: , Rfl:  .  atorvastatin  (LIPITOR) 80 MG tablet, Take 80 mg by mouth daily., Disp: , Rfl: 0 .  calcitRIOL (ROCALTROL) 0.25 MCG capsule, Take 0.5 mcg by mouth daily. , Disp: , Rfl: 1 .  carvedilol (COREG) 25 MG tablet, Take 25 mg by mouth 2 (two) times daily. , Disp: , Rfl: 3 .  furosemide (LASIX) 80 MG tablet, Take 80 mg by mouth 2 (two) times daily., Disp: , Rfl:  .  glucose blood (BAYER CONTOUR TEST) test strip, Check sugar three times daily for insulin dependent diabetes, Disp: 100 each, Rfl: 12 .  inFLIXimab (REMICADE) 100 MG injection, Inject into the vein every 6 (six) weeks. , Disp: , Rfl:  .  insulin aspart (NOVOLOG FLEXPEN) 100 UNIT/ML FlexPen, Take 8-15 units three times a day before each meal (Patient taking differently: Inject 6 Units into the skin 3 (three) times daily with meals. ), Disp: 1 pen, Rfl: 3 .  insulin glargine (LANTUS) 100 UNIT/ML injection, Inject 0.4 mLs (40 Units total) into the skin daily at 10 pm., Disp: 10 mL, Rfl: 11 .  levETIRAcetam (KEPPRA) 1000 MG tablet, Take 1,000 mg by mouth at bedtime. , Disp: , Rfl: 0 .  linezolid (ZYVOX) 600 MG tablet, Take 1 tablet (600 mg total) by mouth daily., Disp: 7 tablet, Rfl: 0 .  lisinopril (PRINIVIL,ZESTRIL) 40 MG tablet, Take 40 mg by mouth daily., Disp: , Rfl: 0 .  nystatin (MYCOSTATIN) 100000 UNIT/ML suspension, Use as directed 5 mLs (500,000 Units total) in the mouth or throat 4 (four) times daily., Disp: 60 mL, Rfl: 0 .  predniSONE (  DELTASONE) 10 MG tablet, Take 1 tablet (10 mg total) by mouth daily with breakfast., Disp: 5 tablet, Rfl: 0 .  sildenafil (VIAGRA) 100 MG tablet, Take 1 tablet (100 mg total) by mouth daily as needed., Disp: 6 tablet, Rfl: 1 .  sodium bicarbonate 650 MG tablet, Take 650 mg by mouth 2 (two) times daily., Disp: , Rfl:   Review of Systems  Constitutional: Positive for appetite change and fatigue. Negative for chills, diaphoresis and fever.  Respiratory: Positive for cough (productive with yellow/ brown phlegm), shortness of  breath and wheezing. Negative for chest tightness.   Cardiovascular: Positive for leg swelling. Negative for chest pain and palpitations.  Gastrointestinal: Positive for abdominal pain (improved ). Negative for nausea and vomiting.  Psychiatric/Behavioral: Positive for sleep disturbance.    Social History  Substance Use Topics  . Smoking status: Never Smoker  . Smokeless tobacco: Never Used  . Alcohol use No   Objective:   BP 100/60 (BP Location: Left Arm, Patient Position: Sitting, Cuff Size: Large)   Pulse 71   Temp 98.7 F (37.1 C) (Oral)   Resp 18   Ht 5\' 11"  (1.803 m)   Wt 218 lb (98.9 kg)   SpO2 94% Comment: room air  BMI 30.40 kg/m  There were no vitals filed for this visit.   Physical Exam   General Appearance:    Alert, cooperative, no distress, appears weak, but no focal weakness. Requiring walker to ambulate.   Eyes:    PERRL, conjunctiva/corneas clear, EOM's intact       Lungs:     Clear to auscultation bilaterally, respirations unlabored  Heart:    Regular rate and rhythm  Neurologic:   Awake, alert, oriented x 3. No apparent focal neurological           defect.   Abd:   No tenderness.         Assessment & Plan:     1. Cerebrovascular accident (CVA), hemorrhagic, due to other mechanism ([place])  Continue high intensity statin. Will check lipids when sugars are back under control. Needs follow up with neurology, he prefers to see local neurologist  - Ambulatory referral to Neurology  2. Seizure ([place]) Presumably secondary to CVA. He states he had EEG and [facility].  - Ambulatory referral to Neurology  3. Type 2 diabetes mellitus with other diabetic kidney complication, with long-term current use of insulin (HCC) Increase Lantus to 40 units a day for better control of fastings.   4. Anemia, unspecified type  - CBC  5. Chronic kidney disease, stage V [facility]) Peritoneal dialysis daily managed by Dr. Rexene Agent - Comprehensive metabolic panel  6. Peritonitis  ([place]) Symptomatically resolved.        [name], MD  [facility]

## 2017-04-29 NOTE — Telephone Encounter (Signed)
[name]

## 2017-04-29 NOTE — Patient Instructions (Addendum)
   Increase Lantus to 40 units daily.

## 2017-04-30 LAB — CBC
Hematocrit: 28.6 % — ABNORMAL LOW (ref 37.5–51.0)
Hemoglobin: 8.9 g/dL — ABNORMAL LOW (ref 13.0–17.7)
MCH: 27.1 pg (ref 26.6–33.0)
MCHC: 31.1 g/dL — ABNORMAL LOW (ref 31.5–35.7)
MCV: 87 fL (ref 79–97)
Platelets: 211 10*3/uL (ref 150–379)
RBC: 3.29 x10E6/uL — ABNORMAL LOW (ref 4.14–5.80)
RDW: 17.3 % — ABNORMAL HIGH (ref 12.3–15.4)
WBC: 22.6 10*3/uL (ref 3.4–10.8)

## 2017-04-30 LAB — COMPREHENSIVE METABOLIC PANEL
ALT: 14 IU/L (ref 0–44)
AST: 20 IU/L (ref 0–40)
Albumin/Globulin Ratio: 0.6 — ABNORMAL LOW (ref 1.2–2.2)
Albumin: 2.4 g/dL — ABNORMAL LOW (ref 3.5–5.5)
Alkaline Phosphatase: 117 IU/L (ref 39–117)
BUN/Creatinine Ratio: 8 — ABNORMAL LOW (ref 9–20)
BUN: 95 mg/dL (ref 6–24)
Bilirubin Total: 0.2 mg/dL (ref 0.0–1.2)
CO2: 23 mmol/L (ref 20–29)
Calcium: 8.1 mg/dL — ABNORMAL LOW (ref 8.7–10.2)
Chloride: 89 mmol/L — ABNORMAL LOW (ref 96–106)
Creatinine, Ser: 11.34 mg/dL — ABNORMAL HIGH (ref 0.76–1.27)
GFR calc Af Amer: 5 mL/min/{1.73_m2} — ABNORMAL LOW (ref >59)
GFR calc non Af Amer: 5 mL/min/{1.73_m2} — ABNORMAL LOW (ref >59)
Globulin, Total: 4 g/dL (ref 1.5–4.5)
Glucose: 157 mg/dL — ABNORMAL HIGH (ref 65–99)
Potassium: 3.7 mmol/L (ref 3.5–5.2)
Sodium: 137 mmol/L (ref 134–144)
Total Protein: 6.4 g/dL (ref 6.0–8.5)

## 2017-05-03 ENCOUNTER — Telehealth: Payer: Self-pay | Admitting: Family Medicine

## 2017-05-03 NOTE — Telephone Encounter (Signed)
Pt is returning call.  CB#[phone]/MW

## 2017-05-12 ENCOUNTER — Encounter: Payer: Self-pay | Admitting: Family Medicine

## 2017-05-12 ENCOUNTER — Ambulatory Visit (INDEPENDENT_AMBULATORY_CARE_PROVIDER_SITE_OTHER): Payer: BLUE CROSS/BLUE SHIELD | Admitting: Family Medicine

## 2017-05-12 VITALS — BP 88/60 | HR 84 | Temp 99.6°F | Resp 16 | Wt 190.0 lb

## 2017-05-12 DIAGNOSIS — Z794 Long term (current) use of insulin: Secondary | ICD-10-CM

## 2017-05-12 DIAGNOSIS — D631 Anemia in chronic kidney disease: Secondary | ICD-10-CM | POA: Diagnosis not present

## 2017-05-12 DIAGNOSIS — D72829 Elevated white blood cell count, unspecified: Secondary | ICD-10-CM | POA: Diagnosis not present

## 2017-05-12 DIAGNOSIS — R531 Weakness: Secondary | ICD-10-CM | POA: Diagnosis not present

## 2017-05-12 DIAGNOSIS — J40 Bronchitis, not specified as acute or chronic: Secondary | ICD-10-CM

## 2017-05-12 DIAGNOSIS — E1129 Type 2 diabetes mellitus with other diabetic kidney complication: Secondary | ICD-10-CM

## 2017-05-12 DIAGNOSIS — N189 Chronic kidney disease, unspecified: Secondary | ICD-10-CM

## 2017-05-12 MED ORDER — LISINOPRIL 20 MG PO TABS: 20.0000 mg | ORAL_TABLET | Freq: Every day | ORAL | 3 refills | Status: DC

## 2017-05-12 MED ORDER — LEVOFLOXACIN 750 MG PO TABS: 750.0000 mg | ORAL_TABLET | Freq: Every day | ORAL | 0 refills | Status: AC

## 2017-05-12 NOTE — Progress Notes (Signed)
Patient: [name] Male    DOB: 1961-05-07   56 y.o.   MRN: [id number] Visit Date: 05/12/2017  Today's Provider: [name], MD   Chief Complaint  Patient presents with  . Follow-up  . Fatigue  . Cough   Subjective:    HPI  Hospitalized at [facility] the last week of June for hemorrhogic CVA treated conservatively. Subsequently admitted [facility] 04/21/17 for hyperglycemia. Noted to have leukocytosis and abdominal pain at that time. He was treated for suspected perotinitis with broad spectrum abx adn discharged on 04/18/2017 with 7 day course of Zyvox.   He had remicade infusion at [facility] on 05/09/2017 shows persistent elevation of WBC, up to 19.7, and persistenet mild anemia with hgb dropping to 9.2  Today he states he continues to feel very fatigued with general malaise since discharge. He also report persistent cough, recently becoming productive of brown sputum. Is worse at night. No dyspnea. No blood in sputum.   His wife also reports that his sugars have been down to 37s. Is taking 34 units Lantus, not using Novolog at al due to low sugars. His BP has also remained low since discharge, despite having discontinued amlodipine. He remains on carvedilol furosemide, and lisinopril.    He reports poor appetite since discharge, but is no longer having any abdominal pains.     Wt Readings from Last 3 Encounters:  05/12/17 190 lb (86.2 kg)  04/29/17 218 lb (98.9 kg)  04/24/17 227 lb 9.6 oz (103.2 kg)    \ BP Readings from Last 3 Encounters:  05/12/17 (!) 88/60  04/29/17 100/60  04/25/17 113/68     Allergies  Allergen Reactions  . Vancomycin Shortness Of Breath    Eyes watering, SOB, wheezing  . Methotrexate Other (See Comments)    Blood count drops  . Tape      Current Outpatient Prescriptions:  .  aspirin EC 81 MG tablet, Take 81 mg by mouth daily., Disp: , Rfl:  .  atorvastatin (LIPITOR) 80 MG tablet, Take 80 mg by mouth daily., Disp: , Rfl: 0 .  calcitRIOL  (ROCALTROL) 0.25 MCG capsule, Take 0.5 mcg by mouth daily. , Disp: , Rfl: 1 .  carvedilol (COREG) 25 MG tablet, Take 25 mg by mouth 2 (two) times daily. , Disp: , Rfl: 3 .  furosemide (LASIX) 80 MG tablet, Take 80 mg by mouth 2 (two) times daily., Disp: , Rfl:  .  glucose blood (BAYER CONTOUR TEST) test strip, Check sugar three times daily for insulin dependent diabetes, Disp: 100 each, Rfl: 12 .  inFLIXimab (REMICADE) 100 MG injection, Inject into the vein every 6 (six) weeks. , Disp: , Rfl:  .  insulin aspart (NOVOLOG FLEXPEN) 100 UNIT/ML FlexPen, Take 8-15 units three times a day before each meal (Patient taking differently: Inject 6 Units into the skin 3 (three) times daily with meals. ), Disp: 1 pen, Rfl: 3 .  insulin glargine (LANTUS) 100 UNIT/ML injection, Inject 0.4 mLs (40 Units total) into the skin daily at 10 pm., Disp: 10 mL, Rfl: 11 .  levETIRAcetam (KEPPRA) 1000 MG tablet, Take 1,000 mg by mouth at bedtime. , Disp: , Rfl: 0 .  lisinopril (PRINIVIL,ZESTRIL) 40 MG tablet, Take 40 mg by mouth daily., Disp: , Rfl: 0 .  sildenafil (VIAGRA) 100 MG tablet, Take 1 tablet (100 mg total) by mouth daily as needed., Disp: 6 tablet, Rfl: 1 .  sodium bicarbonate 650 MG tablet, Take 650 mg  by mouth 2 (two) times daily., Disp: , Rfl:   Review of Systems  Constitutional: Positive for appetite change and fatigue. Negative for chills and fever.  HENT: Positive for congestion.   Respiratory: Positive for cough. Negative for chest tightness, shortness of breath and wheezing.   Cardiovascular: Negative for chest pain and palpitations.  Gastrointestinal: Positive for diarrhea. Negative for abdominal pain, nausea and vomiting.  Musculoskeletal: Positive for gait problem.  Neurological: Positive for weakness.    Social History  Substance Use Topics  . Smoking status: Never Smoker  . Smokeless tobacco: Never Used  . Alcohol use No   Objective:   BP (!) 88/60 (BP Location: Right Arm, Patient  Position: Sitting, Cuff Size: Large)   Pulse 84   Temp 99.6 F (37.6 C) (Oral)   Resp 16   Wt 190 lb (86.2 kg)   SpO2 (!) 88%   BMI 26.50 kg/m     Physical Exam   General Appearance:    Alert, cooperative, no distress, appears weak.   Eyes:    PERRL, conjunctiva/corneas clear, EOM's intact       Lungs:     Occasional expiratory rhonchi, respirations unlabored  Heart:    Regular rate and rhythm  Neurologic:   Awake, alert, oriented x 3. No apparent focal neurological           defect.           Assessment & Plan:     1. Weakness Multifactorial. Likely exacerbate by low blood sugars and low BP. Will reduce Lantus to 20 units daily and lisinopril to 20mg  daily.   2. Leukocytosis, unspecified type No sign of peritonitis at this point. Starting on levoquin for LRI as below.Recheck CBC at follow up.   3. Bronchitis  - levofloxacin (LEVAQUIN) 750 MG tablet; Take 1 tablet (750 mg total) by mouth daily.  Dispense: 10 tablet; Refill: 0  4. Type 2 diabetes mellitus with other diabetic kidney complication, with long-term current use of insulin (HCC) Reduce lantus to 20units daily.   5. Anemia due to chronic kidney disease Recheck at follow up.        [name], MD  [facility]

## 2017-05-12 NOTE — Patient Instructions (Addendum)
   Reduce dose of Lantus (Insulin glargine) to 20 units every day.    You make cut Keppra, atorvastatin, and sodium bicarbonate tablets in half to make them easier to swallow   Change lisinopril dose to 50m, a new prescription has been sent to your pharmacy

## 2017-05-13 ENCOUNTER — Telehealth: Payer: Self-pay | Admitting: Family Medicine

## 2017-05-13 NOTE — Telephone Encounter (Signed)
Contacted patient's wife [name] to get some further details. She states patient has not ate in several days due to difficulty swallowing and sore throat. She states patient had the same symptoms while in the hospital and they were unsure if it was thrush or result from being incubated.   [name] also wanted to let Dr. Caryn Section know that patient is telling her that he is depressed. She states it was mentioned at his OV yesterday, but she wanted to know your recommendations for treatment.  Please see other message from therapist regarding hypotension and discontinuing Lisinopril.   Please advise.

## 2017-05-13 NOTE — Telephone Encounter (Signed)
Avery with OT for [facility] called to say Mr. Ang blood pressure has been consistently low  90/57 today.  Very fatigue.  OT wants to discontinue blood for the weekend to see if I twill go up  Please advise  [phone]  Thanks teri

## 2017-05-13 NOTE — Telephone Encounter (Signed)
Pt wife called states pt is having a hard time swalling due to a sore throat.  Pt has has this issue before and in the past he got a Rx for Nystatin.  Pt rec'd this through the hospital.  [place].  ZM#[phone]

## 2017-05-13 NOTE — Telephone Encounter (Signed)
May d/c lisinopril

## 2017-05-13 NOTE — Telephone Encounter (Signed)
[name] with [facility] advised.

## 2017-05-16 DIAGNOSIS — I69354 Hemiplegia and hemiparesis following cerebral infarction affecting left non-dominant side: Secondary | ICD-10-CM | POA: Diagnosis not present

## 2017-05-16 DIAGNOSIS — I120 Hypertensive chronic kidney disease with stage 5 chronic kidney disease or end stage renal disease: Secondary | ICD-10-CM | POA: Diagnosis not present

## 2017-05-16 DIAGNOSIS — E1122 Type 2 diabetes mellitus with diabetic chronic kidney disease: Secondary | ICD-10-CM | POA: Diagnosis not present

## 2017-05-16 DIAGNOSIS — E1165 Type 2 diabetes mellitus with hyperglycemia: Secondary | ICD-10-CM | POA: Diagnosis not present

## 2017-05-16 MED ORDER — NYSTATIN 100000 UNIT/ML MT SUSP: 5.0000 mL | Freq: Four times a day (QID) | OROMUCOSAL | 2 refills | Status: DC

## 2017-05-16 NOTE — Telephone Encounter (Signed)
Have sent prescription for nystatin. I think depression will improve when his energy improves, but if he would like to try antidepressant then can send in prescription for sertraline 29m, 1/2 tablet daily for 6 days, then increase to 1 tablet daily, #30, rf x 1.

## 2017-05-16 NOTE — Telephone Encounter (Signed)
Wife, Mrs Flessner, advised, they want to wait on starting anything for depression at this time-aa

## 2017-05-17 ENCOUNTER — Telehealth: Payer: Self-pay | Admitting: Family Medicine

## 2017-05-17 DIAGNOSIS — R0602 Shortness of breath: Secondary | ICD-10-CM

## 2017-05-17 NOTE — Telephone Encounter (Signed)
Ok to order 

## 2017-05-17 NOTE — Telephone Encounter (Signed)
He needs chest XR PA and lateral today   Also, decrease carvedilolol to 1/2 tablet BID

## 2017-05-17 NOTE — Telephone Encounter (Signed)
OK to order bedside commode

## 2017-05-17 NOTE — Telephone Encounter (Signed)
[name] (sp?) wanted to inform you that pt is being treated for bronchitis with abx, but is still experiencing SOB. His O2 sats were 88% when checked yesterday. He also fell on Saturday due to loss of balance. There was no injury with the fall. Please advise.

## 2017-05-17 NOTE — Telephone Encounter (Signed)
Cindra with [facility] left message on voicemail asking for a nurse to return her call. CB# [phone]. Please advise. Thanks TNP

## 2017-05-17 NOTE — Telephone Encounter (Signed)
lmtcb

## 2017-05-17 NOTE — Telephone Encounter (Signed)
Order faxed.

## 2017-05-17 NOTE — Telephone Encounter (Signed)
Avery from [facility] called wanting to order a bed side commode.  He asked if this could be faxed to [phone] Attnt: Robynn Pane  Thanks  teri

## 2017-05-17 NOTE — Telephone Encounter (Signed)
Left message with daughter to call back.

## 2017-05-18 NOTE — Addendum Note (Signed)
Addended by: Wilburt Finlay L on: 05/18/2017 02:20 PM   Modules accepted: Orders

## 2017-05-18 NOTE — Telephone Encounter (Signed)
Patient called back c/o shortness of breath. He denies any chest pain, headache, numbness of his extremities, or blurred vision. Patient was advised to decrease carvedilol to 1/2 tablet BID, and order was placed for CXR. Patient was advised to have this done today.

## 2017-05-19 ENCOUNTER — Ambulatory Visit (HOSPITAL_COMMUNITY)
Admission: AD | Admit: 2017-05-19 | Discharge: 2017-05-19 | Disposition: A | Discharge status: Home / Self Care | Payer: BLUE CROSS/BLUE SHIELD | Source: Other Acute Inpatient Hospital | Attending: Emergency Medicine | Admitting: Emergency Medicine

## 2017-05-19 ENCOUNTER — Encounter: Payer: Self-pay | Admitting: Emergency Medicine

## 2017-05-19 ENCOUNTER — Telehealth: Payer: Self-pay

## 2017-05-19 ENCOUNTER — Ambulatory Visit
Admission: RE | Admit: 2017-05-19 | Discharge: 2017-05-19 | Disposition: A | Discharge status: Home / Self Care | Payer: BLUE CROSS/BLUE SHIELD | Source: Ambulatory Visit | Attending: Family Medicine | Admitting: Family Medicine

## 2017-05-19 ENCOUNTER — Emergency Department
Admission: EM | Admit: 2017-05-19 | Discharge: 2017-05-19 | Disposition: A | Discharge status: Other Acute Inpatient Hospital | Payer: BLUE CROSS/BLUE SHIELD | Attending: Emergency Medicine | Admitting: Emergency Medicine

## 2017-05-19 ENCOUNTER — Emergency Department: Payer: BLUE CROSS/BLUE SHIELD

## 2017-05-19 DIAGNOSIS — R918 Other nonspecific abnormal finding of lung field: Secondary | ICD-10-CM

## 2017-05-19 DIAGNOSIS — I120 Hypertensive chronic kidney disease with stage 5 chronic kidney disease or end stage renal disease: Secondary | ICD-10-CM | POA: Insufficient documentation

## 2017-05-19 DIAGNOSIS — J9859 Other diseases of mediastinum, not elsewhere classified: Secondary | ICD-10-CM

## 2017-05-19 DIAGNOSIS — R0602 Shortness of breath: Secondary | ICD-10-CM | POA: Insufficient documentation

## 2017-05-19 DIAGNOSIS — I700 Atherosclerosis of aorta: Secondary | ICD-10-CM | POA: Insufficient documentation

## 2017-05-19 DIAGNOSIS — I517 Cardiomegaly: Secondary | ICD-10-CM

## 2017-05-19 DIAGNOSIS — N185 Chronic kidney disease, stage 5: Secondary | ICD-10-CM | POA: Insufficient documentation

## 2017-05-19 DIAGNOSIS — Z794 Long term (current) use of insulin: Secondary | ICD-10-CM | POA: Insufficient documentation

## 2017-05-19 DIAGNOSIS — E119 Type 2 diabetes mellitus without complications: Secondary | ICD-10-CM | POA: Insufficient documentation

## 2017-05-19 DIAGNOSIS — J90 Pleural effusion, not elsewhere classified: Secondary | ICD-10-CM

## 2017-05-19 DIAGNOSIS — R0902 Hypoxemia: Secondary | ICD-10-CM

## 2017-05-19 DIAGNOSIS — Z7982 Long term (current) use of aspirin: Secondary | ICD-10-CM | POA: Insufficient documentation

## 2017-05-19 DIAGNOSIS — Z79899 Other long term (current) drug therapy: Secondary | ICD-10-CM | POA: Diagnosis not present

## 2017-05-19 LAB — CBC WITH DIFFERENTIAL/PLATELET
Band Neutrophils: 2 %
Basophils Absolute: 0 10*3/uL (ref 0–0.1)
Basophils Relative: 0 %
Blasts: 0 %
Eosinophils Absolute: 0 10*3/uL (ref 0–0.7)
Eosinophils Relative: 0 %
HCT: 26.8 % — ABNORMAL LOW (ref 40.0–52.0)
Hemoglobin: 8.5 g/dL — ABNORMAL LOW (ref 13.0–18.0)
Lymphocytes Relative: 10 %
Lymphs Abs: 1.8 10*3/uL (ref 1.0–3.6)
MCH: 26.9 pg (ref 26.0–34.0)
MCHC: 31.7 g/dL — ABNORMAL LOW (ref 32.0–36.0)
MCV: 84.8 fL (ref 80.0–100.0)
Metamyelocytes Relative: 0 %
Monocytes Absolute: 0.5 10*3/uL (ref 0.2–1.0)
Monocytes Relative: 3 %
Myelocytes: 1 %
Neutro Abs: 15.4 10*3/uL — ABNORMAL HIGH (ref 1.4–6.5)
Neutrophils Relative %: 84 %
Other: 0 %
Platelets: 193 10*3/uL (ref 150–440)
Promyelocytes Absolute: 0 %
RBC: 3.16 MIL/uL — ABNORMAL LOW (ref 4.40–5.90)
RDW: 18.6 % — ABNORMAL HIGH (ref 11.5–14.5)
WBC: 17.7 10*3/uL — ABNORMAL HIGH (ref 3.8–10.6)
nRBC: 0 /100 WBC

## 2017-05-19 LAB — COMPREHENSIVE METABOLIC PANEL
ALT: 14 U/L — ABNORMAL LOW (ref 17–63)
AST: 22 U/L (ref 15–41)
Albumin: 2 g/dL — ABNORMAL LOW (ref 3.5–5.0)
Alkaline Phosphatase: 64 U/L (ref 38–126)
Anion gap: 17 — ABNORMAL HIGH (ref 5–15)
BUN: 89 mg/dL — ABNORMAL HIGH (ref 6–20)
CO2: 25 mmol/L (ref 22–32)
Calcium: 7.9 mg/dL — ABNORMAL LOW (ref 8.9–10.3)
Chloride: 87 mmol/L — ABNORMAL LOW (ref 101–111)
Creatinine, Ser: 14.29 mg/dL — ABNORMAL HIGH (ref 0.61–1.24)
GFR calc Af Amer: 4 mL/min — ABNORMAL LOW (ref >60)
GFR calc non Af Amer: 3 mL/min — ABNORMAL LOW (ref >60)
Glucose, Bld: 254 mg/dL — ABNORMAL HIGH (ref 65–99)
Potassium: 3.2 mmol/L — ABNORMAL LOW (ref 3.5–5.1)
Sodium: 129 mmol/L — ABNORMAL LOW (ref 135–145)
Total Bilirubin: 0.7 mg/dL (ref 0.3–1.2)
Total Protein: 7 g/dL (ref 6.5–8.1)

## 2017-05-19 LAB — RAPID HIV SCREEN (HIV 1/2 AB+AG)
HIV 1/2 Antibodies: NONREACTIVE
HIV-1 P24 Antigen - HIV24: NONREACTIVE

## 2017-05-19 LAB — LACTIC ACID, PLASMA: Lactic Acid, Venous: 1.5 mmol/L (ref 0.5–1.9)

## 2017-05-19 LAB — PROTIME-INR
INR: 1.21
Prothrombin Time: 15.4 seconds — ABNORMAL HIGH (ref 11.4–15.2)

## 2017-05-19 LAB — TROPONIN I: Troponin I: 0.1 ng/mL (ref <0.03)

## 2017-05-19 LAB — BRAIN NATRIURETIC PEPTIDE: B Natriuretic Peptide: 157 pg/mL — ABNORMAL HIGH (ref 0.0–100.0)

## 2017-05-19 MED ORDER — PIPERACILLIN-TAZOBACTAM 3.375 G IVPB 30 MIN
3.3750 g | Freq: Once | INTRAVENOUS | Status: AC
  Administered 2017-05-19: 3.375 g via INTRAVENOUS

## 2017-05-19 MED ORDER — IOPAMIDOL (ISOVUE-300) INJECTION 61%
75.0000 mL | Freq: Once | INTRAVENOUS | Status: AC | PRN
  Administered 2017-05-19: 75 mL via INTRAVENOUS

## 2017-05-19 MED ORDER — CLINDAMYCIN PHOSPHATE 600 MG/50ML IV SOLN
600.0000 mg | Freq: Once | INTRAVENOUS | Status: AC
  Administered 2017-05-19: 600 mg via INTRAVENOUS
  Filled 2017-05-19: qty 50

## 2017-05-19 MED ORDER — SODIUM CHLORIDE 0.9 % IV SOLN
Freq: Once | INTRAVENOUS | Status: AC
  Administered 2017-05-19: 20:00:00 via INTRAVENOUS

## 2017-05-19 NOTE — Telephone Encounter (Signed)
-----   Message from [name], MD sent at 05/19/2017  1:29 PM EDT ----- Patient has fluid on his lung and possibly a lung abscess. He needs to go ER for further evaluation and treatment

## 2017-05-19 NOTE — ED Notes (Signed)
Date and time results received: 05/19/17    Test: troponin Critical Value: 0.10  Name of Provider Notified: Dr. Burlene Arnt

## 2017-05-19 NOTE — ED Notes (Signed)
Pt states he lost 30 lbs in a month, daughter states 40 lbs lost in last month.  Daughter states pt fell a month ago and broke a rib, given pain medication. Made him loopy and forgetful. States he forgot to take his BP medications and suffered a stroke from it. Daughter states that pt has had a decline since then.   Pt stated he needed to defecate, ambulatory on oxygen to toilet to defecate, used walker.

## 2017-05-19 NOTE — ED Notes (Signed)
Pt assist to toilet by this RN. Steady gait with walker noted. Pt in NAD. Returned to bed with no complications. VSS.

## 2017-05-19 NOTE — ED Notes (Signed)
Pt states SOB and cough with yellow sputum x 2 weeks. States has been receiving peritoneal dialysis (RLQ) but today received a R chest hemodialysis catheter. Then had x ray done and was told possible lung abcess with fluid on lung and told to come to ED. Pt answers questions and speech is clear. Pt states SOB while lying and moving. Pt appears in no distress. Was 77% on RA initially, placed on 2 L nasal cannula and 97% currently.

## 2017-05-19 NOTE — ED Notes (Signed)
Pt requested a longer bed because feet were hanging off. Pt informed that the ED doesn't have that but this RN could put pt in a recliner. Pt stated that he would try that. Pt moved to recliner without difficulty. Pt verbalized comfort.

## 2017-05-19 NOTE — ED Notes (Signed)
Pharmacy tech at bedside 

## 2017-05-19 NOTE — ED Provider Notes (Addendum)
[facility] Emergency Department Provider Note  ____________________________________________   I have reviewed the triage vital signs and the nursing notes.   HISTORY  Chief Complaint Shortness of Breath    HPI [name] is a 56 y.o. male who is end-stage renal disease, on peritoneal dialysis but willl start again regular dialysis tomorrow, had a Port-A-Cath placed today on theright chest, has been short of breath, has he thinks 30-40 pound weight loss over the last month. He is on Remicade for Crohn's disease, presents today complaining terms of breath. He said shortness of breath for several days at least a week he believes. He was found to have low sats, chest x-ray was obtained as an outpatient which showed an effusion and he is sent here for further evaluation. He denies fever, cough, leg swelling. He states he is taking out the normal amount of dialysis ultrafiltrate. He denies any chest pain. He just states he feels short of breath worse when he lies flat sometimes but also when he walks around. No cough no wheeze. Patient was recently admitted what was presumed to be SBP, with a leukocytosis however the leukocytosis did persist until at least 2 weeks ago.  Past Medical History:  Diagnosis Date  . Anemia   . Crohn disease ([place])   . Diabetes mellitus without complication ([place])   . DVT of lower extremity (deep venous thrombosis) ([place]) 2016  . Hidradenitis suppurativa   . Hypertension   . ICH (intracerebral hemorrhage) ([place])   . Renal disorder     Patient Active Problem List   Diagnosis Date Noted  . Peritonitis ([place]) 04/21/2017  . Stroke ([place]) 04/15/2017  . Seizure ([place]) 04/15/2017  . End stage renal failure on dialysis ([place]) 04/12/2016  . Aphthae 02/20/2016  . Hidradenitis 02/20/2016  . Leg pain 02/20/2016  . Microalbuminuria 02/20/2016  . Neuropathy 02/20/2016  . Chest pain 02/20/2016  . Pyogenic arthritis of knee ([place]) 02/04/2016  .  Narrowing of intervertebral disc space 08/29/2015  . Vascular disorder of lower extremity 08/29/2015  . Failure of erection 08/29/2015  . Accumulation of fluid in tissues 08/29/2015  . Hypercholesteremia 08/29/2015  . BP (high blood pressure) 08/29/2015  . Anemia due to chronic kidney disease 05/26/2015  . Venous insufficiency of leg 09/04/2014  . Deep vein thrombosis of lower extremity ([place]) 08/16/2014  . Prostatic intraepithelial neoplasia 11/02/2013  . Abnormal prostate specific antigen 09/11/2013  . Elevated prostate specific antigen (PSA) 09/11/2013  . Benign prostatic hyperplasia with urinary obstruction 08/13/2013  . Spermatocele 08/13/2013  . Chronic kidney disease, stage V ([employer]) 01/25/2013  . Avitaminosis D 01/25/2013  . Abnormal presence of protein in urine 03/28/2012  . Diabetes ([place]) 03/28/2012  . Crohn's disease ([place]) 08/03/2011    Past Surgical History:  Procedure Laterality Date  . ABDOMINAL SURGERY    . KNEE SURGERY Left 02/04/2016   [facility]    Prior to Admission medications   Medication Sig Start Date End Date Taking? Authorizing Provider  aspirin EC 81 MG tablet Take 81 mg by mouth daily.    [provider]  atorvastatin (LIPITOR) 80 MG tablet Take 80 mg by mouth daily. 04/20/17   [provider]  calcitRIOL (ROCALTROL) 0.25 MCG capsule Take 0.5 mcg by mouth daily.  11/02/15   [provider]  carvedilol (COREG) 25 MG tablet Take 25 mg by mouth 2 (two) times daily.  11/02/15   [provider]  furosemide (LASIX) 80 MG tablet Take 80 mg  by mouth 2 (two) times daily.    [provider]  glucose blood (BAYER CONTOUR TEST) test strip Check sugar three times daily for insulin dependent diabetes 12/10/16   [name], MD  inFLIXimab (REMICADE) 100 MG injection Inject into the vein every 6 (six) weeks.     [provider]  insulin aspart (NOVOLOG FLEXPEN) 100 UNIT/ML FlexPen Take 8-15 units three times a day before each  meal Patient taking differently: Inject 6 Units into the skin 3 (three) times daily with meals.  08/18/16   [name], MD  insulin glargine (LANTUS) 100 UNIT/ML injection Inject 0.4 mLs (40 Units total) into the skin daily at 10 pm. 04/25/17   Epifanio Lesches, MD  levETIRAcetam (KEPPRA) 1000 MG tablet Take 1,000 mg by mouth at bedtime.  04/20/17   [provider]  levofloxacin (LEVAQUIN) 750 MG tablet Take 1 tablet (750 mg total) by mouth daily. 05/12/17 05/22/17  [name], MD  lisinopril (PRINIVIL,ZESTRIL) 20 MG tablet Take 1 tablet (20 mg total) by mouth daily. 05/12/17   [name], MD  nystatin (MYCOSTATIN) 100000 UNIT/ML suspension Take 5 mLs (500,000 Units total) by mouth 4 (four) times daily. 05/16/17   [name], MD  sildenafil (VIAGRA) 100 MG tablet Take 1 tablet (100 mg total) by mouth daily as needed. 04/27/16   [name], MD  sodium bicarbonate 650 MG tablet Take 650 mg by mouth 2 (two) times daily.    [provider]    Allergies Vancomycin; Methotrexate; and Tape  Family History  Problem Relation Age of Onset  . Irritable bowel syndrome Sister   . Diabetes Sister   . Heart disease Mother   . Diabetes Mother   . Heart disease Father   . Rheumatic fever Father        as child  . Psoriasis Brother   . Arthritis Brother   . Diabetes Sister   . Diabetes Sister     Social History Social History  Substance Use Topics  . Smoking status: Never Smoker  . Smokeless tobacco: Never Used  . Alcohol use No    Review of Systems Constitutional: No fever/chills Eyes: No visual changes. ENT: No sore throat. No stiff neck no neck pain Cardiovascular: Denies chest pain. Respiratory: Positive shortness of breath. Gastrointestinal:   no vomiting.  No diarrhea.  No constipation. Genitourinary: Negative for dysuria. Musculoskeletal: Negative lower extremity swelling Skin: Negative for rash. Neurological: Negative for severe headaches,  focal weakness or numbness.   ____________________________________________   PHYSICAL EXAM:  VITAL SIGNS: ED Triage Vitals  Enc Vitals Group     BP 05/19/17 1456 (!) 96/56     Pulse Rate 05/19/17 1456 79     Resp 05/19/17 1456 20     Temp 05/19/17 1456 98.5 F (36.9 C)     Temp Source 05/19/17 1456 Oral     SpO2 05/19/17 1527 99 %     Weight 05/19/17 1457 190 lb (86.2 kg)     Height 05/19/17 1457 5' 11"  (1.803 m)     Head Circumference --      Peak Flow --      Pain Score --      Pain Loc --      Pain Edu? --      Excl. in [place]? --     Constitutional: Alert and oriented. Well appearing and in no acute distress. Eyes: Conjunctivae are normal Head: Atraumatic HEENT: No congestion/rhinnorhea. Mucous  membranes are moist.  Oropharynx non-erythematous Neck:   Nontender with no meningismus, no masses, no stridor Cardiovascular: Normal rate, regular rhythm. Grossly normal heart sounds, Albeit somewhat muffled questionable rub.  Good peripheral circulation. Respiratory: Normal respiratory effort.  No retractions. Diminished in the bases on the right, otherwise, no wheeze or rhonchi appreciated mild increased rr Abdominal: Soft and nontender. No distention. No guarding no rebound Back:  There is no focal tenderness or step off.  there is no midline tenderness there are no lesions noted. there is no CVA tenderness Musculoskeletal: No lower extremity tenderness, no upper extremity tenderness. No joint effusions, no DVT signs strong distal pulses mild bilateral edema Neurologic:  Normal speech and language. No gross focal neurologic deficits are appreciated.  Skin:  Skin is warm, dry and intact. No rash noted. Psychiatric: Mood and affect are normal. Speech and behavior are normal.  ____________________________________________   LABS (all labs ordered are listed, but only abnormal results are displayed)  Labs Reviewed  COMPREHENSIVE METABOLIC PANEL - Abnormal; Notable for the  following:       Result Value   Sodium 129 (*)    Potassium 3.2 (*)    Chloride 87 (*)    Glucose, Bld 254 (*)    BUN 89 (*)    Creatinine, Ser 14.29 (*)    Calcium 7.9 (*)    Albumin 2.0 (*)    ALT 14 (*)    GFR calc non Af Amer 3 (*)    GFR calc Af Amer 4 (*)    Anion gap 17 (*)    All other components within normal limits  CBC WITH DIFFERENTIAL/PLATELET - Abnormal; Notable for the following:    WBC 17.7 (*)    RBC 3.16 (*)    Hemoglobin 8.5 (*)    HCT 26.8 (*)    MCHC 31.7 (*)    RDW 18.6 (*)    All other components within normal limits  TROPONIN I - Abnormal; Notable for the following:    Troponin I 0.10 (*)    All other components within normal limits  PROTIME-INR - Abnormal; Notable for the following:    Prothrombin Time 15.4 (*)    All other components within normal limits  CULTURE, BLOOD (ROUTINE X 2)  CULTURE, BLOOD (ROUTINE X 2)  LACTIC ACID, PLASMA  LACTIC ACID, PLASMA  BRAIN NATRIURETIC PEPTIDE   ____________________________________________  EKG  I personally interpreted any EKGs ordered by me or triage  ____________________________________________  RADIOLOGY  I reviewed any imaging ordered by me or triage that were performed during my shift and, if possible, patient and/or family made aware of any abnormal findings. ____________________________________________   PROCEDURES  Procedure(s) performed: None  Procedures  Critical Care performed: CRITICAL CARE Performed by: Schuyler Amor   Total critical care time: 45 minutes  Critical care time was exclusive of separately billable procedures and treating other patients.  Critical care was necessary to treat or prevent imminent or life-threatening deterioration.  Critical care was time spent personally by me on the following activities: development of treatment plan with patient and/or surrogate as well as nursing, discussions with consultants, evaluation of patient's response to treatment,  examination of patient, obtaining history from patient or surrogate, ordering and performing treatments and interventions, ordering and review of laboratory studies, ordering and review of radiographic studies, pulse oximetry and re-evaluation of patient's condition.   ____________________________________________   INITIAL IMPRESSION / ASSESSMENT AND PLAN / ED COURSE  Pertinent labs & imaging results  that were available during my care of the patient were reviewed by me and considered in my medical decision making (see chart for details).  ----------------------------------------- 4:10 PM on 05/19/2017 -----------------------------------------  Paging ID  ----------------------------------------- 4:41 PM on 05/19/2017 -----------------------------------------  Discussed with Dr. Adrian Prows of infectious disease who agrees with starting Zosyn and clindamycin given patient's vancomycin allergy. Differential does include lymphoma, which I think is likely, or infectious among other pathologies. Low suspicion for TB and patient has had interferon gold negative. We will send HIV test. Patient made aware of findings. We are paging Dr. Faith Rogue of CT surgery for further assessment of whether the patient can safely stay here for aspiration or he needs to be sent. Of note, there is no indication of pericardial tamponade on exam or CT fortunately.  ----------------------------------------- 4:49 PM on 05/19/2017 -----------------------------------------  Discussed with Dr. Faith Rogue of her thoracic surgery, who will evaluate the images and tell me if he feels the patient is appropriate to stay in this facility. Patient is in no acute distress, we'll keep the family informed as we know what the plan is.,  ----------------------------------------- 5:00 PM on 05/19/2017 -----------------------------------------  Dr. Faith Rogue did call back, he states that he looks at the patient's CT scan, degree of  mediastinal involvement suggest the patient transcends the ability of this facility to take care of him, as Dr. Genevive Bi does not have the capacity to sternotomy here. I have therefore paged [facility] at family request, we are waiting for [facility] CT surgery to call back. Patient remains asymptomatic here essentially on oxygen. Lactic is negative. He has had no fevers at all either today or for the last several weeks during the time he has been gradually getting more short of breath. All this to inclines one to suspect that this might be more of an oncologic process however, clearly without CT read one must be aggressive with antibiotics which we are being. We are awaiting definitive disposition from consultation with CT surgery at [facility]. Family aware of all findings.  ----------------------------------------- 6:58 PM on 05/19/2017 -----------------------------------------  Patient has been accepted by [facility] by Dr. Laverta Baltimore, we are waiting placement with a bed the patient in no acute distress. He does have slightly increased respiratory rate when he lies flat when he sits up he gets better he is not complaining of ongoing dyspnea he feels much better nausea and sats are good vital signs thus far reassuring, no evidence of eminent decompensation. HIV is nonreactive. CT surgery agrees with our management thus far. Awaiting transfer.   ----------------------------------------- 7:31 PM on 05/19/2017 -----------------------------------------  Sitting up in the chair, patient's respiratory rate is 17, he is in no acute distress, he is breathing comfortably and he has no issues at this moment. We are still awaiting transfer from [facility].   ____________________________________________   FINAL CLINICAL IMPRESSION(S) / ED DIAGNOSES  Final diagnoses:  None      This chart was dictated using voice recognition software.  Despite best efforts to proofread,  errors can occur which can change meaning.      Schuyler Amor,  MD 05/19/17 1642    Schuyler Amor, MD 05/19/17 1650    Schuyler Amor, MD 05/19/17 1731    Schuyler Amor, MD 05/19/17 Mauro Kaufmann    Schuyler Amor, MD 05/19/17 7416    Schuyler Amor, MD 05/20/17 [phone]

## 2017-05-19 NOTE — ED Notes (Signed)
Pt resting at this time. Family at bedside

## 2017-05-19 NOTE — ED Triage Notes (Addendum)
Pt here from PCP office. Went for [facility]. Xray shows lung abscess vs hydropneumothorax. BP normally 992 systolic. Pt has PD port but just got HD permcath to right chest placed today.

## 2017-05-19 NOTE — ED Notes (Signed)
Called pharmacy for clindamycin

## 2017-05-19 NOTE — Telephone Encounter (Signed)
Pt advised.Marland KitchenMarland KitchenHe agreed to go to the ED.  He says he has a ride.  Thanks,   -Mickel Baas

## 2017-05-19 NOTE — ED Notes (Signed)
EMTALA form reviewed for completion

## 2017-05-20 DIAGNOSIS — J869 Pyothorax without fistula: Secondary | ICD-10-CM | POA: Insufficient documentation

## 2017-05-20 HISTORY — DX: Pyothorax without fistula: J86.9

## 2017-05-20 LAB — PATHOLOGIST SMEAR REVIEW

## 2017-05-23 LAB — HM HEPATITIS C SCREENING LAB: HM Hepatitis Screen: NEGATIVE

## 2017-05-24 ENCOUNTER — Ambulatory Visit: Payer: Self-pay | Admitting: Family Medicine

## 2017-05-24 LAB — CULTURE, BLOOD (ROUTINE X 2)
Culture: NO GROWTH
Culture: NO GROWTH
Special Requests: ADEQUATE

## 2017-05-25 ENCOUNTER — Telehealth: Payer: Self-pay

## 2017-05-25 NOTE — Telephone Encounter (Signed)
Received a curtesy call from nurse case manager from [facility] that patient was recently admitted at [facility]. Patient has pneumonia and pleural effusions from what she can see in her system, she can not tell if he has been discharged yet. Just wanted to let the provider know she states-aa

## 2017-05-26 ENCOUNTER — Telehealth: Payer: Self-pay | Admitting: Family Medicine

## 2017-05-26 NOTE — Telephone Encounter (Signed)
James with [facility] called stating he was suppose to see pt Tuesday and today.  Pt has been in the hospital since Monday and he has not been able to see pt.  KS#[phone]/IX

## 2017-05-30 ENCOUNTER — Other Ambulatory Visit: Payer: Self-pay | Admitting: Family Medicine

## 2017-05-30 DIAGNOSIS — J40 Bronchitis, not specified as acute or chronic: Secondary | ICD-10-CM

## 2017-05-31 NOTE — Telephone Encounter (Signed)
Received refill request for levofloxacin, but I haven't heard anything from patient. Please see if he is still in hospital and why they are sending refill request for antibiotic.

## 2017-05-31 NOTE — Telephone Encounter (Signed)
Spoke with patient's wife, and she reports that the patient is currently taking Augmentin. She says that he just got out of the hospital yesterday.   She is also requesting a refill on amlodipine and furosemide because the patient is out. He uses [facility] on [address].   She also mentions that the patient missed his neurology appt due to being in the hospital. She is asking if this can be rescheduled? Please advise. Thanks!

## 2017-06-01 ENCOUNTER — Telehealth: Payer: Self-pay | Admitting: Family Medicine

## 2017-06-01 MED ORDER — AMLODIPINE BESYLATE 10 MG PO TABS: 10.0000 mg | ORAL_TABLET | Freq: Every day | ORAL | 5 refills | Status: DC | PRN

## 2017-06-01 MED ORDER — FUROSEMIDE 80 MG PO TABS: 80.0000 mg | ORAL_TABLET | Freq: Two times a day (BID) | ORAL | 4 refills | Status: DC

## 2017-06-01 NOTE — Telephone Encounter (Signed)
Dr. Weber Cooks office wanted patient to call to reschedule neurology appointment. Number is [phone]

## 2017-06-01 NOTE — Telephone Encounter (Signed)
Verbal okay given to Lana with [facility].

## 2017-06-01 NOTE — Telephone Encounter (Signed)
Lana with [facility] called asking for verbal order to continue skilled home health and wound care.  Can leave message if she does not answer.  [phone]  Thanks Con Memos

## 2017-06-01 NOTE — Telephone Encounter (Signed)
Please advise 

## 2017-06-01 NOTE — Telephone Encounter (Signed)
OK to continue home health as requested below.

## 2017-06-01 NOTE — Telephone Encounter (Signed)
Advised patient's wife as below.  

## 2017-06-02 ENCOUNTER — Telehealth: Payer: Self-pay | Admitting: Family Medicine

## 2017-06-02 NOTE — Telephone Encounter (Signed)
Tammy with [facility] request a verbal order to see pt for nursing 2 times a week for 4 weeks.  CB#[phone]/MW

## 2017-06-02 NOTE — Telephone Encounter (Signed)
Stephanie with [facility] request a verbal order for occupational therapy, 1 time a week for 1 week and 2 times for for 4 weeks.  FM#[phone]/VP

## 2017-06-02 NOTE — Telephone Encounter (Signed)
OK for home nursing twice a week as below.

## 2017-06-02 NOTE — Telephone Encounter (Signed)
Please review. Thanks!  

## 2017-06-03 ENCOUNTER — Telehealth: Payer: Self-pay | Admitting: Family Medicine

## 2017-06-03 NOTE — Telephone Encounter (Signed)
Please advise 

## 2017-06-03 NOTE — Telephone Encounter (Signed)
Pt wife [name] is asking if the FMLA paper work is ready.  This will need to be faxed or she will need to pick this up on Tuesday morning.  QK#[phone]/LV

## 2017-06-03 NOTE — Telephone Encounter (Signed)
OK 

## 2017-06-03 NOTE — Telephone Encounter (Signed)
Stephanie with [facility] was given verbal ok.

## 2017-06-03 NOTE — Telephone Encounter (Signed)
Left detailed message on Tammy's vm giving okay for pt.

## 2017-06-04 NOTE — Telephone Encounter (Signed)
I have not seen any FMLA forms for him

## 2017-06-06 NOTE — Telephone Encounter (Signed)
Patient's wife [name] advised. She states she faxed FMLA forms on 8/9. She is going to re fax forms today. She needs them complete tomorrow so her FMLA will not be denied. Molli Hazard that Dr. Caryn Section is out of the office today. Will put forms on his desk when we receive them.

## 2017-06-09 ENCOUNTER — Inpatient Hospital Stay: Payer: Self-pay | Admitting: Family Medicine

## 2017-06-09 ENCOUNTER — Ambulatory Visit (INDEPENDENT_AMBULATORY_CARE_PROVIDER_SITE_OTHER): Payer: BLUE CROSS/BLUE SHIELD | Admitting: Family Medicine

## 2017-06-09 ENCOUNTER — Encounter: Payer: Self-pay | Admitting: Family Medicine

## 2017-06-09 VITALS — BP 148/68 | HR 84 | Temp 98.4°F | Resp 16 | Wt 195.0 lb

## 2017-06-09 DIAGNOSIS — Z8673 Personal history of transient ischemic attack (TIA), and cerebral infarction without residual deficits: Secondary | ICD-10-CM | POA: Diagnosis not present

## 2017-06-09 DIAGNOSIS — J869 Pyothorax without fistula: Secondary | ICD-10-CM

## 2017-06-09 DIAGNOSIS — Z992 Dependence on renal dialysis: Secondary | ICD-10-CM

## 2017-06-09 DIAGNOSIS — Z794 Long term (current) use of insulin: Secondary | ICD-10-CM | POA: Diagnosis not present

## 2017-06-09 DIAGNOSIS — E1129 Type 2 diabetes mellitus with other diabetic kidney complication: Secondary | ICD-10-CM

## 2017-06-09 DIAGNOSIS — N186 End stage renal disease: Secondary | ICD-10-CM

## 2017-06-09 MED ORDER — ATORVASTATIN CALCIUM 80 MG PO TABS: 80.0000 mg | ORAL_TABLET | Freq: Every day | ORAL | 3 refills | Status: DC

## 2017-06-09 NOTE — Progress Notes (Signed)
Patient: [name] Male    DOB: [birth year]   56 y.o.   MRN: [id number] Visit Date: 06/09/2017  Today's Provider: [name], MD   Chief Complaint  Patient presents with  . Hospitalization Follow-up   Subjective:    HPI  Follow up Hospitalization  Patient was admitted to [facility] on 05/19/2017 and discharged on 05/30/2017. He was treated for empyema and septic shock. Upon discharge patient was transitioned to an oral antibiotic regimen to be contnued through 06/11/2017. Patient was to continue Physical therapy and Occupational therapy 3 times a week. Patient was discharged home with home health care and was to follow up with thoracic surgery clinic where he has follow up scheduled for 8-28 He reports good compliance with treatment. He reports this condition is Improved. Patient still has pain in the upper right side near the incision site. He states his energy level is steadily improving, his appetite is slowly improving. Gets short of breath with exertion, but is steadily improving.   He states his blood sugars are running in the low 100s, no hypoglycemia. Not requiring mealtime insulin, only taking 7 units of Lantus at night  He continues hemodialysis and has follow up at [facility] September 10.   ------------------------------------------------------------------------------------       Allergies  Allergen Reactions  . Vancomycin Shortness Of Breath    Eyes watering, SOB, wheezing  . Methotrexate Other (See Comments)    Blood count drops  . Tape      Current Outpatient Prescriptions:  .  amLODipine (NORVASC) 10 MG tablet, Take 1 tablet (10 mg total) by mouth daily as needed., Disp: 30 tablet, Rfl: 5 .  aspirin EC 81 MG tablet, Take 81 mg by mouth daily., Disp: , Rfl:  .  atorvastatin (LIPITOR) 80 MG tablet, Take 80 mg by mouth daily., Disp: , Rfl: 0 .  calcitRIOL (ROCALTROL) 0.25 MCG capsule, Take 0.5 mcg by mouth daily. , Disp: , Rfl: 1 .  carvedilol  (COREG) 25 MG tablet, Take 12.5 mg by mouth 2 (two) times daily. , Disp: , Rfl: 3 .  furosemide (LASIX) 80 MG tablet, Take 1 tablet (80 mg total) by mouth 2 (two) times daily., Disp: 60 tablet, Rfl: 4 .  glucose blood (BAYER CONTOUR TEST) test strip, Check sugar three times daily for insulin dependent diabetes, Disp: 100 each, Rfl: 12 .  inFLIXimab (REMICADE) 100 MG injection, Inject into the vein every 6 (six) weeks. , Disp: , Rfl:  .  insulin aspart (NOVOLOG FLEXPEN) 100 UNIT/ML FlexPen, Take 8-15 units three times a day before each meal (Patient taking differently: Inject 6 Units into the skin 3 (three) times daily with meals. ), Disp: 1 pen, Rfl: 3 .  insulin glargine (LANTUS) 100 UNIT/ML injection, Inject 0.4 mLs (40 Units total) into the skin daily at 10 pm., Disp: 10 mL, Rfl: 11 .  levETIRAcetam (KEPPRA) 1000 MG tablet, Take 1,000 mg by mouth at bedtime. , Disp: , Rfl: 0 .  lisinopril (PRINIVIL,ZESTRIL) 20 MG tablet, Take 1 tablet (20 mg total) by mouth daily. (Patient not taking: Reported on 05/19/2017), Disp: 90 tablet, Rfl: 3 .  nystatin (MYCOSTATIN) 100000 UNIT/ML suspension, Take 5 mLs (500,000 Units total) by mouth 4 (four) times daily., Disp: 60 mL, Rfl: 2 .  sildenafil (VIAGRA) 100 MG tablet, Take 1 tablet (100 mg total) by mouth daily as needed., Disp: 6 tablet, Rfl: 1 .  sodium bicarbonate 650 MG tablet, Take 650 mg by mouth  2 (two) times daily., Disp: , Rfl:   Review of Systems  Constitutional: Positive for fatigue. Negative for appetite change, chills, diaphoresis and fever.  Respiratory: Negative for chest tightness, shortness of breath and wheezing.   Cardiovascular: Positive for leg swelling (in feet and ankles). Negative for chest pain and palpitations.  Gastrointestinal: Positive for abdominal pain (upper right side). Negative for blood in stool, constipation, diarrhea, nausea and vomiting.  Musculoskeletal: Positive for myalgias (pain in his feet).    Social History    Substance Use Topics  . Smoking status: Never Smoker  . Smokeless tobacco: Never Used  . Alcohol use No   Objective:   BP (!) 148/68 (BP Location: Right Arm, Patient Position: Sitting, Cuff Size: Normal)   Pulse 84   Temp 98.4 F (36.9 C) (Oral)   Resp 16   Wt 195 lb (88.5 kg)   BMI 27.20 kg/m  There were no vitals filed for this visit.   Physical Exam   General Appearance:    Alert, cooperative, no distress  Eyes:    PERRL, conjunctiva/corneas clear, EOM's intact       Lungs:     Clear to auscultation bilaterally, respirations unlabored  Heart:    Regular rate and rhythm  Neurologic:   Awake, alert, oriented x 3. No apparent focal neurological           defect.           Assessment & Plan:     1. History of CVA (cerebrovascular accident) Doing well with change from pravastatin to atorvastatin after CVA. Refilled today.  - atorvastatin (LIPITOR) 80 MG tablet; Take 1 tablet (80 mg total) by mouth daily.  Dispense: 90 tablet; Refill: 3  2. Type 2 diabetes mellitus with other diabetic kidney complication, with long-term current use of insulin (HCC) Stable on 7 units lantus daily.   3. End stage renal failure on dialysis [facility]) Continues dialysis with follow up at [facility] scheduled on 06-27-2017  4. Empyema [facility]) Recovering well, is to finish antibiotic and follow up [facility] next week as scheduled.         [name], MD  [facility]

## 2017-06-14 ENCOUNTER — Telehealth: Payer: Self-pay

## 2017-06-14 NOTE — Telephone Encounter (Signed)
[name] with [facility] called to let us know that patient missed PT appointment due to other appointments scheduled. sd

## 2017-06-16 ENCOUNTER — Telehealth: Payer: Self-pay | Admitting: Family Medicine

## 2017-06-16 NOTE — Telephone Encounter (Signed)
OK 

## 2017-06-16 NOTE — Telephone Encounter (Signed)
Verbal order given to Lana with [facility]

## 2017-06-16 NOTE — Telephone Encounter (Signed)
[name] is requesting a verbal order for continued skilled nursing for 60 days.  1 visit a week for 8 weeks and 3 visits as needed.  CB#[phone]/MW

## 2017-06-16 NOTE — Telephone Encounter (Signed)
Please review-aa 

## 2017-06-17 ENCOUNTER — Emergency Department
Admission: EM | Admit: 2017-06-17 | Discharge: 2017-06-17 | Disposition: A | Discharge status: Home / Self Care | Payer: BLUE CROSS/BLUE SHIELD | Attending: Emergency Medicine | Admitting: Emergency Medicine

## 2017-06-17 ENCOUNTER — Other Ambulatory Visit
Admission: RE | Admit: 2017-06-17 | Discharge: 2017-06-17 | Disposition: A | Discharge status: Home / Self Care | Payer: BLUE CROSS/BLUE SHIELD | Source: Other Acute Inpatient Hospital | Attending: Nephrology | Admitting: Nephrology

## 2017-06-17 ENCOUNTER — Encounter: Payer: Self-pay | Admitting: Emergency Medicine

## 2017-06-17 DIAGNOSIS — Z79899 Other long term (current) drug therapy: Secondary | ICD-10-CM | POA: Diagnosis not present

## 2017-06-17 DIAGNOSIS — I120 Hypertensive chronic kidney disease with stage 5 chronic kidney disease or end stage renal disease: Secondary | ICD-10-CM | POA: Diagnosis not present

## 2017-06-17 DIAGNOSIS — N186 End stage renal disease: Secondary | ICD-10-CM | POA: Insufficient documentation

## 2017-06-17 DIAGNOSIS — Z992 Dependence on renal dialysis: Secondary | ICD-10-CM | POA: Diagnosis not present

## 2017-06-17 DIAGNOSIS — E119 Type 2 diabetes mellitus without complications: Secondary | ICD-10-CM | POA: Insufficient documentation

## 2017-06-17 DIAGNOSIS — R710 Precipitous drop in hematocrit: Secondary | ICD-10-CM | POA: Diagnosis present

## 2017-06-17 DIAGNOSIS — Z8673 Personal history of transient ischemic attack (TIA), and cerebral infarction without residual deficits: Secondary | ICD-10-CM | POA: Insufficient documentation

## 2017-06-17 DIAGNOSIS — Z794 Long term (current) use of insulin: Secondary | ICD-10-CM | POA: Insufficient documentation

## 2017-06-17 DIAGNOSIS — E876 Hypokalemia: Secondary | ICD-10-CM | POA: Diagnosis not present

## 2017-06-17 DIAGNOSIS — Z7982 Long term (current) use of aspirin: Secondary | ICD-10-CM | POA: Insufficient documentation

## 2017-06-17 DIAGNOSIS — E1140 Type 2 diabetes mellitus with diabetic neuropathy, unspecified: Secondary | ICD-10-CM | POA: Insufficient documentation

## 2017-06-17 LAB — BASIC METABOLIC PANEL
Anion gap: 8 (ref 5–15)
BUN: <5 mg/dL — ABNORMAL LOW (ref 6–20)
CO2: 30 mmol/L (ref 22–32)
Calcium: 8.2 mg/dL — ABNORMAL LOW (ref 8.9–10.3)
Chloride: 96 mmol/L — ABNORMAL LOW (ref 101–111)
Creatinine, Ser: 2.11 mg/dL — ABNORMAL HIGH (ref 0.61–1.24)
GFR calc Af Amer: 39 mL/min — ABNORMAL LOW (ref >60)
GFR calc non Af Amer: 34 mL/min — ABNORMAL LOW (ref >60)
Glucose, Bld: 110 mg/dL — ABNORMAL HIGH (ref 65–99)
Potassium: 2.8 mmol/L — ABNORMAL LOW (ref 3.5–5.1)
Sodium: 134 mmol/L — ABNORMAL LOW (ref 135–145)

## 2017-06-17 LAB — CBC WITH DIFFERENTIAL/PLATELET
Basophils Absolute: 0.1 10*3/uL (ref 0–0.1)
Basophils Relative: 1 %
Eosinophils Absolute: 0.1 10*3/uL (ref 0–0.7)
Eosinophils Relative: 1 %
HCT: 23.1 % — ABNORMAL LOW (ref 40.0–52.0)
Hemoglobin: 7.5 g/dL — ABNORMAL LOW (ref 13.0–18.0)
Lymphocytes Relative: 25 %
Lymphs Abs: 2.2 10*3/uL (ref 1.0–3.6)
MCH: 28.6 pg (ref 26.0–34.0)
MCHC: 32.4 g/dL (ref 32.0–36.0)
MCV: 88.3 fL (ref 80.0–100.0)
Monocytes Absolute: 0.9 10*3/uL (ref 0.2–1.0)
Monocytes Relative: 10 %
Neutro Abs: 5.3 10*3/uL (ref 1.4–6.5)
Neutrophils Relative %: 63 %
Platelets: 367 10*3/uL (ref 150–440)
RBC: 2.61 MIL/uL — ABNORMAL LOW (ref 4.40–5.90)
RDW: 17.2 % — ABNORMAL HIGH (ref 11.5–14.5)
WBC: 8.6 10*3/uL (ref 3.8–10.6)

## 2017-06-17 LAB — COMPREHENSIVE METABOLIC PANEL
ALT: 8 U/L — ABNORMAL LOW (ref 17–63)
AST: 16 U/L (ref 15–41)
Albumin: 2.2 g/dL — ABNORMAL LOW (ref 3.5–5.0)
Alkaline Phosphatase: 81 U/L (ref 38–126)
Anion gap: 9 (ref 5–15)
BUN: <5 mg/dL — ABNORMAL LOW (ref 6–20)
CO2: 29 mmol/L (ref 22–32)
Calcium: 7.9 mg/dL — ABNORMAL LOW (ref 8.9–10.3)
Chloride: 95 mmol/L — ABNORMAL LOW (ref 101–111)
Creatinine, Ser: 1.69 mg/dL — ABNORMAL HIGH (ref 0.61–1.24)
GFR calc Af Amer: 51 mL/min — ABNORMAL LOW (ref >60)
GFR calc non Af Amer: 44 mL/min — ABNORMAL LOW (ref >60)
Glucose, Bld: 109 mg/dL — ABNORMAL HIGH (ref 65–99)
Potassium: 2.4 mmol/L — CL (ref 3.5–5.1)
Sodium: 133 mmol/L — ABNORMAL LOW (ref 135–145)
Total Bilirubin: 0.6 mg/dL (ref 0.3–1.2)
Total Protein: 7 g/dL (ref 6.5–8.1)

## 2017-06-17 LAB — TYPE AND SCREEN
ABO/RH(D): B POS
Antibody Screen: NEGATIVE

## 2017-06-17 LAB — HEMOGLOBIN: Hemoglobin: 6.7 g/dL — ABNORMAL LOW (ref 13.0–18.0)

## 2017-06-17 MED ORDER — POTASSIUM CHLORIDE 20 MEQ/15ML (10%) PO SOLN: 40.0000 meq | Freq: Once | ORAL | Status: DC

## 2017-06-17 MED ORDER — POTASSIUM CHLORIDE 20 MEQ/15ML (10%) PO SOLN
20.0000 meq | Freq: Once | ORAL | Status: AC
  Administered 2017-06-17: 20 meq via ORAL
  Filled 2017-06-17: qty 15

## 2017-06-17 MED ORDER — POTASSIUM CHLORIDE 10 MEQ/100ML IV SOLN
10.0000 meq | Freq: Once | INTRAVENOUS | Status: AC
  Administered 2017-06-17: 10 meq via INTRAVENOUS
  Filled 2017-06-17: qty 100

## 2017-06-17 MED ORDER — POTASSIUM CHLORIDE CRYS ER 20 MEQ PO TBCR: 40.0000 meq | EXTENDED_RELEASE_TABLET | Freq: Once | ORAL | Status: DC

## 2017-06-17 MED ORDER — POTASSIUM CHLORIDE 20 MEQ/15ML (10%) PO SOLN
10.0000 meq | Freq: Once | ORAL | Status: AC
  Administered 2017-06-17: 10 meq via ORAL
  Filled 2017-06-17: qty 7.5

## 2017-06-17 MED ORDER — POTASSIUM CHLORIDE CRYS ER 20 MEQ PO TBCR
40.0000 meq | EXTENDED_RELEASE_TABLET | Freq: Once | ORAL | Status: AC
  Administered 2017-06-17: 40 meq via ORAL
  Filled 2017-06-17: qty 2

## 2017-06-17 NOTE — ED Provider Notes (Signed)
[facility] Emergency Department Provider Note   ____________________________________________   First MD Initiated Contact with Patient 06/17/17 1736     (approximate)  I have reviewed the triage vital signs and the nursing notes.   HISTORY  Chief Complaint Low hemoglobin    HPI [name] is a 56 y.o. male Comes into the emergency room after being told his blood count was low and to go to the emergency room. His blood count initially was 6.7 but on repeat was 7.5. This was done after dialysis. His potassium was also somewhat low. I discussed this with the dialysis doctor who recommended some by mouth and IV potassium.this was done his potassium was repeated at somewhat better now I will give him a little bit more potassium in the left him go. Patient has been feeling well the whole time he is not weak or lightheaded or short of breath or anything else.   Past Medical History:  Diagnosis Date  . Anemia   . Crohn disease ([place])   . Diabetes mellitus without complication ([place])   . DVT of lower extremity (deep venous thrombosis) ([place]) 2016  . Hidradenitis suppurativa   . Hypertension   . ICH (intracerebral hemorrhage) ([place])   . Renal disorder     Patient Active Problem List   Diagnosis Date Noted  . Empyema ([place]) 05/20/2017  . Peritonitis ([place]) 04/21/2017  . History of CVA (cerebrovascular accident) 04/15/2017  . Seizure ([place]) 04/15/2017  . End stage renal failure on dialysis ([place]) 04/12/2016  . Aphthae 02/20/2016  . Hidradenitis 02/20/2016  . Leg pain 02/20/2016  . Microalbuminuria 02/20/2016  . Neuropathy 02/20/2016  . Chest pain 02/20/2016  . Pyogenic arthritis of knee ([place]) 02/04/2016  . Narrowing of intervertebral disc space 08/29/2015  . Vascular disorder of lower extremity 08/29/2015  . Failure of erection 08/29/2015  . Accumulation of fluid in tissues 08/29/2015  . Hypercholesteremia 08/29/2015  . BP (high blood pressure)  08/29/2015  . Anemia due to chronic kidney disease 05/26/2015  . Venous insufficiency of leg 09/04/2014  . Deep vein thrombosis of lower extremity ([place]) 08/16/2014  . Prostatic intraepithelial neoplasia 11/02/2013  . Elevated prostate specific antigen (PSA) 09/11/2013  . Benign prostatic hyperplasia with urinary obstruction 08/13/2013  . Spermatocele 08/13/2013  . Avitaminosis D 01/25/2013  . Abnormal presence of protein in urine 03/28/2012  . Diabetes ([place]) 03/28/2012  . Crohn's disease ([place]) 08/03/2011    Past Surgical History:  Procedure Laterality Date  . ABDOMINAL SURGERY    . KNEE SURGERY Left 02/04/2016   [facility]    Prior to Admission medications   Medication Sig Start Date End Date Taking? Authorizing Provider  amLODipine (NORVASC) 10 MG tablet Take 1 tablet (10 mg total) by mouth daily as needed. 06/01/17  Yes [name], MD  aspirin EC 81 MG tablet Take 81 mg by mouth daily.   Yes [provider]  atorvastatin (LIPITOR) 80 MG tablet Take 1 tablet (80 mg total) by mouth daily. 06/09/17  Yes [name], MD  calcitRIOL (ROCALTROL) 0.25 MCG capsule Take 0.5 mcg by mouth daily.  11/02/15  Yes [provider]  carvedilol (COREG) 25 MG tablet Take 12.5 mg by mouth 2 (two) times daily.  11/02/15  Yes [provider]  furosemide (LASIX) 80 MG tablet Take 1 tablet (80 mg total) by mouth 2 (two) times daily. 06/01/17  Yes [name], MD  glucose blood (BAYER CONTOUR TEST) test strip Check  sugar three times daily for insulin dependent diabetes 12/10/16  Yes [name], [name], MD  inFLIXimab (REMICADE) 100 MG injection Inject into the vein every 6 (six) weeks.    Yes [provider]  insulin glargine (LANTUS) 100 UNIT/ML injection Inject 0.4 mLs (40 Units total) into the skin daily at 10 pm. Patient taking differently: Inject 7 Units into the skin at bedtime.  04/25/17  Yes Epifanio Lesches, MD  levETIRAcetam (KEPPRA) 1000 MG tablet Take 1,000  mg by mouth at bedtime.  04/20/17  Yes [provider]  oxyCODONE (OXY IR/ROXICODONE) 5 MG immediate release tablet Take 1 tablet by mouth as needed. 05/30/17  Yes [provider]  polyethylene glycol (MIRALAX / GLYCOLAX) packet Take 1 packet by mouth as needed. 05/30/17  Yes [provider]  sodium bicarbonate 650 MG tablet Take 650 mg by mouth 2 (two) times daily.   Yes [provider]  triamcinolone ointment (KENALOG) 0.1 % Apply 1 application topically as needed.  05/02/17  Yes [provider]  insulin aspart (NOVOLOG FLEXPEN) 100 UNIT/ML FlexPen Take 8-15 units three times a day before each meal Patient not taking: Reported on 06/17/2017 08/18/16   [name], MD  lisinopril (PRINIVIL,ZESTRIL) 20 MG tablet Take 1 tablet (20 mg total) by mouth daily. Patient not taking: Reported on 05/19/2017 05/12/17   [name], MD  nystatin (MYCOSTATIN) 100000 UNIT/ML suspension Take 5 mLs (500,000 Units total) by mouth 4 (four) times daily. Patient not taking: Reported on 06/17/2017 05/16/17   [name], MD  sildenafil (VIAGRA) 100 MG tablet Take 1 tablet (100 mg total) by mouth daily as needed. Patient not taking: Reported on 06/17/2017 04/27/16   [name], MD    Allergies Vancomycin; Methotrexate; and Tape  Family History  Problem Relation Age of Onset  . Irritable bowel syndrome Sister   . Diabetes Sister   . Heart disease Mother   . Diabetes Mother   . Heart disease Father   . Rheumatic fever Father        as child  . Psoriasis Brother   . Arthritis Brother   . Diabetes Sister   . Diabetes Sister     Social History Social History  Substance Use Topics  . Smoking status: Never Smoker  . Smokeless tobacco: Never Used  . Alcohol use No    Review of Systems  Constitutional: No fever/chills Eyes: No visual changes. ENT: No sore throat. Cardiovascular: Denies chest pain. Respiratory: Denies shortness of  breath. Gastrointestinal: No abdominal pain.  No nausea, no vomiting.  No diarrhea.  No constipation. Genitourinary: Negative for dysuria. Musculoskeletal: Negative for back pain. Skin: Negative for rash. Neurological: Negative for headaches, focal weakness   ____________________________________________   PHYSICAL EXAM:  VITAL SIGNS: ED Triage Vitals  Enc Vitals Group     BP 06/17/17 1701 140/67     Pulse Rate 06/17/17 1701 75     Resp 06/17/17 1701 18     Temp 06/17/17 1701 99 F (37.2 C)     Temp Source 06/17/17 1701 Oral     SpO2 06/17/17 1701 100 %     Weight 06/17/17 1702 228 lb (103.4 kg)     Height 06/17/17 1702 5' 11"  (1.803 m)     Head Circumference --      Peak Flow --      Pain Score 06/17/17 1705 3     Pain Loc --      Pain Edu? --  Excl. in [place]? --     Constitutional: Alert and oriented. Well appearing and in no acute distress. Eyes: Conjunctivae are normal.  Head: Atraumatic. Nose: No congestion/rhinnorhea. Mouth/Throat: Mucous membranes are moist.  Oropharynx non-erythematous. Neck: No stridor. Cardiovascular: Normal rate, regular rhythm. Grossly normal heart sounds.  Good peripheral circulation. Respiratory: Normal respiratory effort.  No retractions. Lungs CTAB. Gastrointestinal: Soft and nontender. No distention. No abdominal bruits. No CVA tenderness. Rectal:Normal tone no prostate tenderness Hemoccult negative  Musculoskeletal: No lower extremity tenderness nor edema.  No joint effusions. Neurologic:  Normal speech and language. No gross focal neurologic deficits are appreciated.  Skin:  Skin is warm, dry and intact. No rash noted. Psychiatric: Mood and affect are normal. Speech and behavior are normal.  ____________________________________________   LABS (all labs ordered are listed, but only abnormal results are displayed)  Labs Reviewed  CBC WITH DIFFERENTIAL/PLATELET - Abnormal; Notable for the following:       Result Value   RBC 2.61  (*)    Hemoglobin 7.5 (*)    HCT 23.1 (*)    RDW 17.2 (*)    All other components within normal limits  COMPREHENSIVE METABOLIC PANEL - Abnormal; Notable for the following:    Sodium 133 (*)    Potassium 2.4 (*)    Chloride 95 (*)    Glucose, Bld 109 (*)    BUN <5 (*)    Creatinine, Ser 1.69 (*)    Calcium 7.9 (*)    Albumin 2.2 (*)    ALT 8 (*)    GFR calc non Af Amer 44 (*)    GFR calc Af Amer 51 (*)    All other components within normal limits  BASIC METABOLIC PANEL - Abnormal; Notable for the following:    Sodium 134 (*)    Potassium 2.8 (*)    Chloride 96 (*)    Glucose, Bld 110 (*)    BUN <5 (*)    Creatinine, Ser 2.11 (*)    Calcium 8.2 (*)    GFR calc non Af Amer 34 (*)    GFR calc Af Amer 39 (*)    All other components within normal limits  TYPE AND SCREEN   ____________________________________________  EKG   ____________________________________________  RADIOLOGY    ____________________________________________   PROCEDURES  Procedure(s) performed:   Procedures  Critical Care performed:   ____________________________________________   INITIAL IMPRESSION / ASSESSMENT AND PLAN / ED COURSE  Pertinent labs & imaging results that were available during my care of the patient were reviewed by me and considered in my medical decision making (see chart for details).        ____________________________________________   FINAL CLINICAL IMPRESSION(S) / ED DIAGNOSES  Final diagnoses:  Hypokalemia      NEW MEDICATIONS STARTED DURING THIS VISIT:  New Prescriptions   No medications on file     Note:  This document was prepared using Dragon voice recognition software and may include unintentional dictation errors.    Nena Polio, MD 06/17/17 [phone]

## 2017-06-17 NOTE — Discharge Instructions (Signed)
Please return to the emergency room for any weakness or cramps or lightheadedness or any other problems. Make sure you get your  dialysis in 2 days as planned.we'll give you one more dose of potassium to take in the morning otherwise do not take any more potassium.

## 2017-06-17 NOTE — ED Triage Notes (Signed)
Pt sent by PCP for evaluation of hemoglobin 6.7. Pt denies any known bleeding. Pt reports side removed Tuesday to right side after having a lung infection.

## 2017-06-17 NOTE — ED Notes (Signed)
AAOx3.  Skin warm and dry.  NAD 

## 2017-06-22 ENCOUNTER — Telehealth: Payer: Self-pay | Admitting: Family Medicine

## 2017-06-22 NOTE — Telephone Encounter (Signed)
[facility] just wanted to advise [place] of a missed visit today.  They will see him tomorrow for PT  Thanks teri

## 2017-06-30 ENCOUNTER — Telehealth: Payer: Self-pay | Admitting: Family Medicine

## 2017-06-30 NOTE — Telephone Encounter (Signed)
Home health wants verbal for OT for 2 times a week for 4 weeks.  Please give verbal [phone]  Thanks Con Memos

## 2017-06-30 NOTE — Telephone Encounter (Signed)
That's fine

## 2017-07-04 NOTE — Telephone Encounter (Signed)
L/M advising below.  

## 2017-07-08 DIAGNOSIS — T827XXD Infection and inflammatory reaction due to other cardiac and vascular devices, implants and grafts, subsequent encounter: Secondary | ICD-10-CM | POA: Diagnosis not present

## 2017-07-08 DIAGNOSIS — J869 Pyothorax without fistula: Secondary | ICD-10-CM | POA: Diagnosis not present

## 2017-07-08 DIAGNOSIS — I69354 Hemiplegia and hemiparesis following cerebral infarction affecting left non-dominant side: Secondary | ICD-10-CM | POA: Diagnosis not present

## 2017-07-08 DIAGNOSIS — Z48813 Encounter for surgical aftercare following surgery on the respiratory system: Secondary | ICD-10-CM | POA: Diagnosis not present

## 2017-07-21 ENCOUNTER — Encounter: Payer: Self-pay | Admitting: Family Medicine

## 2017-07-21 ENCOUNTER — Ambulatory Visit (INDEPENDENT_AMBULATORY_CARE_PROVIDER_SITE_OTHER): Payer: BLUE CROSS/BLUE SHIELD | Admitting: Family Medicine

## 2017-07-21 VITALS — BP 155/88 | HR 115 | Temp 98.1°F | Resp 16 | Ht 71.0 in | Wt 186.0 lb

## 2017-07-21 DIAGNOSIS — Z23 Encounter for immunization: Secondary | ICD-10-CM

## 2017-07-21 DIAGNOSIS — L600 Ingrowing nail: Secondary | ICD-10-CM

## 2017-07-21 MED ORDER — CEPHALEXIN 500 MG PO CAPS: 500.0000 mg | ORAL_CAPSULE | Freq: Every day | ORAL | 0 refills | Status: AC

## 2017-07-21 NOTE — Patient Instructions (Signed)
Ingrown Toenail An ingrown toenail occurs when the corner or sides of your toenail grow into the surrounding skin. The big toe is most commonly affected, but it can happen to any of your toes. If your ingrown toenail is not treated, you will be at risk for infection. What are the causes? This condition may be caused by:  Wearing shoes that are too small or tight.  Injury or trauma, such as stubbing your toe or having your toe stepped on.  Improper cutting or care of your toenails.  Being born with (congenital) nail or foot abnormalities, such as having a nail that is too big for your toe.  What increases the risk? Risk factors for an ingrown toenail include:  Age. Your nails tend to thicken as you get older, so ingrown nails are more common in older people.  Diabetes.  Cutting your toenails incorrectly.  Blood circulation problems.  What are the signs or symptoms? Symptoms may include:  Pain, soreness, or tenderness.  Redness.  Swelling.  Hardening of the skin surrounding the toe.  Your ingrown toenail may be infected if there is fluid, pus, or drainage. How is this diagnosed? An ingrown toenail may be diagnosed by medical history and physical exam. If your toenail is infected, your health care provider may test a sample of the drainage. How is this treated? Treatment depends on the severity of your ingrown toenail. Some ingrown toenails may be treated at home. More severe or infected ingrown toenails may require surgery to remove all or part of the nail. Infected ingrown toenails may also be treated with antibiotic medicines. Follow these instructions at home:  If you were prescribed an antibiotic medicine, finish all of it even if you start to feel better.  Soak your foot in warm soapy water for 20 minutes, 3 times per day or as directed by your health care provider.  Carefully lift the edge of the nail away from the sore skin by wedging a small piece of cotton under  the corner of the nail. This may help with the pain. Be careful not to cause more injury to the area.  Wear shoes that fit well. If your ingrown toenail is causing you pain, try wearing sandals, if possible.  Trim your toenails regularly and carefully. Do not cut them in a curved shape. Cut your toenails straight across. This prevents injury to the skin at the corners of the toenail.  Keep your feet clean and dry.  If you are having trouble walking and are given crutches by your health care provider, use them as directed.  Do not pick at your toenail or try to remove it yourself.  Take medicines only as directed by your health care provider.  Keep all follow-up visits as directed by your health care provider. This is important. Contact a health care provider if:  Your symptoms do not improve with treatment. Get help right away if:  You have red streaks that start at your foot and go up your leg.  You have a fever.  You have increased redness, swelling, or pain.  You have fluid, blood, or pus coming from your toenail. This information is not intended to replace advice given to you by your health care provider. Make sure you discuss any questions you have with your health care provider. Document Released: 10/01/2000 Document Revised: 03/05/2016 Document Reviewed: 08/28/2014 Elsevier Interactive Patient Education  2018 Elsevier Inc.  

## 2017-07-21 NOTE — Progress Notes (Signed)
Patient: [name] Male    DOB: 18-Apr-1961   56 y.o.   MRN: [id number] Visit Date: 07/21/2017  Today's Provider: [name], MD   Chief Complaint  Patient presents with  . Sore   Subjective:    Patient states that his right big toe has a sore near the cuticle. Patient thinks it mat be an ingrown toenail. His toe started hurting 2 weeks ago. He has been soaking his foot in epsom salt which seems to be helping . However his toe is very painful.       Allergies  Allergen Reactions  . Vancomycin Shortness Of Breath    Eyes watering, SOB, wheezing  . Methotrexate Other (See Comments)    Blood count drops  . Tape      Current Outpatient Prescriptions:  .  amLODipine (NORVASC) 10 MG tablet, Take 1 tablet (10 mg total) by mouth daily as needed., Disp: 30 tablet, Rfl: 5 .  aspirin EC 81 MG tablet, Take 81 mg by mouth daily., Disp: , Rfl:  .  atorvastatin (LIPITOR) 80 MG tablet, Take 1 tablet (80 mg total) by mouth daily., Disp: 90 tablet, Rfl: 3 .  calcitRIOL (ROCALTROL) 0.25 MCG capsule, Take 0.5 mcg by mouth daily. , Disp: , Rfl: 1 .  carvedilol (COREG) 25 MG tablet, Take 12.5 mg by mouth 2 (two) times daily. , Disp: , Rfl: 3 .  furosemide (LASIX) 80 MG tablet, Take 1 tablet (80 mg total) by mouth 2 (two) times daily., Disp: 60 tablet, Rfl: 4 .  glucose blood (BAYER CONTOUR TEST) test strip, Check sugar three times daily for insulin dependent diabetes, Disp: 100 each, Rfl: 12 .  inFLIXimab (REMICADE) 100 MG injection, Inject into the vein every 6 (six) weeks. , Disp: , Rfl:  .  insulin aspart (NOVOLOG FLEXPEN) 100 UNIT/ML FlexPen, Take 8-15 units three times a day before each meal, Disp: 1 pen, Rfl: 3 .  insulin glargine (LANTUS) 100 UNIT/ML injection, Inject 0.4 mLs (40 Units total) into the skin daily at 10 pm. (Patient taking differently: Inject 7 Units into the skin at bedtime. ), Disp: 10 mL, Rfl: 11 .  levETIRAcetam (KEPPRA) 1000 MG tablet, Take 1,000 mg by  mouth at bedtime. , Disp: , Rfl: 0 .  lisinopril (PRINIVIL,ZESTRIL) 20 MG tablet, Take 1 tablet (20 mg total) by mouth daily., Disp: 90 tablet, Rfl: 3 .  nystatin (MYCOSTATIN) 100000 UNIT/ML suspension, Take 5 mLs (500,000 Units total) by mouth 4 (four) times daily., Disp: 60 mL, Rfl: 2 .  oxyCODONE (OXY IR/ROXICODONE) 5 MG immediate release tablet, Take 1 tablet by mouth as needed., Disp: , Rfl: 0 .  polyethylene glycol (MIRALAX / GLYCOLAX) packet, Take 1 packet by mouth as needed., Disp: , Rfl: 0 .  sildenafil (VIAGRA) 100 MG tablet, Take 1 tablet (100 mg total) by mouth daily as needed., Disp: 6 tablet, Rfl: 1 .  sodium bicarbonate 650 MG tablet, Take 650 mg by mouth 2 (two) times daily., Disp: , Rfl:  .  triamcinolone ointment (KENALOG) 0.1 %, Apply 1 application topically as needed. , Disp: , Rfl: 0  Review of Systems  Constitutional: Negative for appetite change, chills and fever.  Respiratory: Negative for chest tightness, shortness of breath and wheezing.   Cardiovascular: Negative for chest pain and palpitations.  Gastrointestinal: Negative for abdominal pain, nausea and vomiting.    Social History  Substance Use Topics  . Smoking status: Never Smoker  . Smokeless  tobacco: Never Used  . Alcohol use No   Objective:   BP (!) 155/88 (BP Location: Right Arm, Patient Position: Sitting, Cuff Size: Normal)   Pulse (!) 115   Temp 98.1 F (36.7 C) (Oral)   Resp 16   Ht 5' 11"  (1.803 m)   Wt 186 lb (84.4 kg)   SpO2 99%   BMI 25.94 kg/m  Vitals:   07/21/17 0836  BP: (!) 155/88  Pulse: (!) 115  Resp: 16  Temp: 98.1 F (36.7 C)  TempSrc: Oral  SpO2: 99%  Weight: 186 lb (84.4 kg)  Height: 5' 11"  (1.803 m)     Physical Exam  General appearance: alert, well developed, well nourished, cooperative and in no distress Head: Normocephalic, without obvious abnormality, atraumatic Respiratory: Respirations even and unlabored, normal respiratory rate Extremities: right great  toenail ingrown with surrounding erythema and swelling with no discharge.      Assessment & Plan:     1. Ingrown toenail of right foot with infection  - Ambulatory referral to Podiatry - cephALEXin (KEFLEX) 500 MG capsule; Take 1 capsule (500 mg total) by mouth daily. (take after dialysis)  Dispense: 10 capsule; Refill: 0  Call if symptoms change or if not rapidly improving.     2. Need for influenza vaccination  - Flu Vaccine QUAD 36+ mos IM       [name], MD  [facility]

## 2017-07-28 ENCOUNTER — Ambulatory Visit: Payer: Self-pay | Admitting: Podiatry

## 2017-08-02 ENCOUNTER — Ambulatory Visit (INDEPENDENT_AMBULATORY_CARE_PROVIDER_SITE_OTHER): Payer: BLUE CROSS/BLUE SHIELD | Admitting: Podiatry

## 2017-08-02 ENCOUNTER — Encounter: Payer: Self-pay | Admitting: Podiatry

## 2017-08-02 VITALS — HR 73

## 2017-08-02 DIAGNOSIS — I998 Other disorder of circulatory system: Secondary | ICD-10-CM

## 2017-08-02 MED ORDER — HYDROCODONE-ACETAMINOPHEN 5-300 MG PO TABS
1.0000 | ORAL_TABLET | Freq: Three times a day (TID) | ORAL | 0 refills | Status: DC | PRN
Start: 2017-08-02 — End: 2017-10-20

## 2017-08-02 NOTE — Progress Notes (Signed)
   HPI: 56 year old male with a history of diabetes mellitus presents today for severe pain and tenderness to the right great toe as well as the second digit right foot. Patient states that he's experienced significant pain and tenderness and he is unable to sleep at nighttime. Patient states he has not slept for the past week. Patient denies trauma. No alleviating factors.  Past Medical History:  Diagnosis Date  . Anemia   . Crohn disease ([place])   . Diabetes mellitus without complication ([place])   . DVT of lower extremity (deep venous thrombosis) ([place]) 2016  . Hidradenitis suppurativa   . Hypertension   . ICH (intracerebral hemorrhage) ([place])   . Renal disorder      Physical Exam: General: The patient is alert and oriented x3 in no acute distress.  Dermatology: The digits to the right great toe as well as second digit right foot are cold to touch compared to the contralateral limb. Absent capillary refill. There appears to be some dry stable ischemia to the right great toe lateral border. Right foot is cold compared to the contralateral limb  Vascular: Palpable pedal pulses bilaterally. Although pedal pulses are palpable to the right foot there is ischemia noted to the digits 1 and 2 of the right foot as well as cold extremity compared to the contralateral limb.  Neurological: Epicritic and protective threshold grossly intact bilaterally.   Musculoskeletal Exam: Range of motion within normal limits to all pedal and ankle joints bilateral. Muscle strength 5/5 in all groups bilateral.   Assessment: -  Critical limb ischemia right lower extremity - Ischemic digits 1-2 right foot - Diabetes mellitus   Plan of Care:  - Patient was evaluated today. - Today I explained to the patient that his symptoms are mostly of a vascular etiology. - Today we will place an immediate referral for vascular consultation -  Prescription for Vicodin 5/325 mg #30 - Return to clinic in 4 weeks once vascular  workup and consultation is been performed   Edrick Kins, DPM [facility]  Dr. Edrick Kins, DPM    [address], [place] [address]                Office [phone]  Fax [phone]

## 2017-08-04 ENCOUNTER — Ambulatory Visit: Payer: Self-pay | Admitting: Podiatry

## 2017-08-05 ENCOUNTER — Other Ambulatory Visit (INDEPENDENT_AMBULATORY_CARE_PROVIDER_SITE_OTHER): Payer: Self-pay | Admitting: Vascular Surgery

## 2017-08-05 ENCOUNTER — Ambulatory Visit (INDEPENDENT_AMBULATORY_CARE_PROVIDER_SITE_OTHER): Payer: BLUE CROSS/BLUE SHIELD | Admitting: Vascular Surgery

## 2017-08-05 ENCOUNTER — Encounter (INDEPENDENT_AMBULATORY_CARE_PROVIDER_SITE_OTHER): Payer: Self-pay | Admitting: Vascular Surgery

## 2017-08-05 ENCOUNTER — Encounter (INDEPENDENT_AMBULATORY_CARE_PROVIDER_SITE_OTHER): Payer: Self-pay

## 2017-08-05 ENCOUNTER — Ambulatory Visit: Payer: Self-pay | Admitting: Podiatry

## 2017-08-05 VITALS — BP 151/83 | HR 64 | Resp 16 | Ht 71.0 in | Wt 181.0 lb

## 2017-08-05 DIAGNOSIS — E1129 Type 2 diabetes mellitus with other diabetic kidney complication: Secondary | ICD-10-CM | POA: Diagnosis not present

## 2017-08-05 DIAGNOSIS — Z992 Dependence on renal dialysis: Secondary | ICD-10-CM | POA: Diagnosis not present

## 2017-08-05 DIAGNOSIS — E7800 Pure hypercholesterolemia, unspecified: Secondary | ICD-10-CM | POA: Diagnosis not present

## 2017-08-05 DIAGNOSIS — I70221 Atherosclerosis of native arteries of extremities with rest pain, right leg: Secondary | ICD-10-CM

## 2017-08-05 DIAGNOSIS — N186 End stage renal disease: Secondary | ICD-10-CM

## 2017-08-05 DIAGNOSIS — Z794 Long term (current) use of insulin: Secondary | ICD-10-CM | POA: Diagnosis not present

## 2017-08-05 DIAGNOSIS — I10 Essential (primary) hypertension: Secondary | ICD-10-CM | POA: Diagnosis not present

## 2017-08-05 DIAGNOSIS — I70229 Atherosclerosis of native arteries of extremities with rest pain, unspecified extremity: Secondary | ICD-10-CM | POA: Insufficient documentation

## 2017-08-05 NOTE — Assessment & Plan Note (Signed)
blood glucose control important in reducing the progression of atherosclerotic disease. Also, involved in wound healing. On appropriate medications.  

## 2017-08-05 NOTE — Assessment & Plan Note (Signed)
The certainly represents an atherosclerotic risk factor and impedes wound healing.

## 2017-08-05 NOTE — Progress Notes (Signed)
Patient ID: [name], male   DOB: [birth year], 56 y.o.   MRN: [id number]  Chief Complaint  Patient presents with  . New Patient (Initial Visit)    PAD    HPI [name] is a 56 y.o. male.  I am asked to see the patient by Dr. Amalia Hailey in Podiatry for evaluation of ischemic toes on the right foot.  The patient reports about 1 month worth of severe pain in his right foot.  Narcotic pain medication is not relieving the pain.  Nothing is really helped.  He saw his podiatrist earlier this week and with the discoloration of his first and second toes on the right foot he was referred urgently to our office.  He denies a previous history of vascular disease or treatment to the lower extremities to his knowledge.  He is on dialysis on Mondays, with Fridays.  He has multiple other atherosclerotic risk factors as listed below.  He denies any left leg symptoms.  Prior to this event, he had some pain in his legs with activity that could have been claudication.  He denies previous open ulcerations or infection.  The discoloration of the toes has not improved.   Past Medical History:  Diagnosis Date  . Anemia   . Crohn disease ([place])   . Diabetes mellitus without complication ([place])   . DVT of lower extremity (deep venous thrombosis) ([place]) 2016  . Hidradenitis suppurativa   . Hypertension   . ICH (intracerebral hemorrhage) ([place])   . Renal disorder     Past Surgical History:  Procedure Laterality Date  . ABDOMINAL SURGERY    . KNEE SURGERY Left 02/04/2016   [facility]    Family History  Problem Relation Age of Onset  . Irritable bowel syndrome Sister   . Diabetes Sister   . Heart disease Mother   . Diabetes Mother   . Heart disease Father   . Rheumatic fever Father        as child  . Psoriasis Brother   . Arthritis Brother   . Diabetes Sister   . Diabetes Sister      Social History Social History  Substance Use Topics  . Smoking status: Never Smoker  . Smokeless tobacco: Never  Used  . Alcohol use No  No IVDU  Allergies  Allergen Reactions  . Vancomycin Shortness Of Breath    Eyes watering, SOB, wheezing  . Methotrexate Other (See Comments)    Blood count drops  . Tape     Current Outpatient Prescriptions  Medication Sig Dispense Refill  . amLODipine (NORVASC) 10 MG tablet Take 1 tablet (10 mg total) by mouth daily as needed. 30 tablet 5  . aspirin EC 81 MG tablet Take 81 mg by mouth daily.    Marland Kitchen atorvastatin (LIPITOR) 80 MG tablet Take 1 tablet (80 mg total) by mouth daily. 90 tablet 3  . calcitRIOL (ROCALTROL) 0.25 MCG capsule Take 0.5 mcg by mouth daily.   1  . carvedilol (COREG) 25 MG tablet Take 12.5 mg by mouth 2 (two) times daily.   3  . furosemide (LASIX) 80 MG tablet Take 1 tablet (80 mg total) by mouth 2 (two) times daily. 60 tablet 4  . glucose blood (BAYER CONTOUR TEST) test strip Check sugar three times daily for insulin dependent diabetes 100 each 12  . Hydrocodone-Acetaminophen (VICODIN) 5-300 MG TABS Take 1 tablet by mouth every 8 (eight) hours as needed. 30 each 0  . inFLIXimab (  REMICADE) 100 MG injection Inject into the vein every 6 (six) weeks.     . insulin aspart (NOVOLOG FLEXPEN) 100 UNIT/ML FlexPen Take 8-15 units three times a day before each meal 1 pen 3  . insulin glargine (LANTUS) 100 UNIT/ML injection Inject 0.4 mLs (40 Units total) into the skin daily at 10 pm. (Patient taking differently: Inject 7 Units into the skin at bedtime. ) 10 mL 11  . levETIRAcetam (KEPPRA) 1000 MG tablet Take 1,000 mg by mouth at bedtime.   0  . lisinopril (PRINIVIL,ZESTRIL) 20 MG tablet Take 1 tablet (20 mg total) by mouth daily. 90 tablet 3  . nystatin (MYCOSTATIN) 100000 UNIT/ML suspension Take 5 mLs (500,000 Units total) by mouth 4 (four) times daily. 60 mL 2  . oxyCODONE (OXY IR/ROXICODONE) 5 MG immediate release tablet Take 1 tablet by mouth as needed.  0  . polyethylene glycol (MIRALAX / GLYCOLAX) packet Take 1 packet by mouth as needed.  0  .  sildenafil (VIAGRA) 100 MG tablet Take 1 tablet (100 mg total) by mouth daily as needed. 6 tablet 1  . sodium bicarbonate 650 MG tablet Take 650 mg by mouth 2 (two) times daily.    Marland Kitchen triamcinolone ointment (KENALOG) 0.1 % Apply 1 application topically as needed.   0   No current facility-administered medications for this visit.       REVIEW OF SYSTEMS (Negative unless checked)  Constitutional: []Weight loss  []Fever  []Chills Cardiac: []Chest pain   []Chest pressure   []Palpitations   []Shortness of breath when laying flat   []Shortness of breath at rest   []Shortness of breath with exertion. Vascular:  []Pain in legs with walking   []Pain in legs at rest   []Pain in legs when laying flat   []Claudication   []Pain in feet when walking  []Pain in feet at rest  []Pain in feet when laying flat   []History of DVT   []Phlebitis   []Swelling in legs   []Varicose veins   []Non-healing ulcers Pulmonary:   []Uses home oxygen   []Productive cough   []Hemoptysis   []Wheeze  []COPD   []Asthma Neurologic:  []Dizziness  []Blackouts   []Seizures   [x]History of stroke   []History of TIA  []Aphasia   []Temporary blindness   []Dysphagia   []Weakness or numbness in arms   []Weakness or numbness in legs Musculoskeletal:  [x]Arthritis   []Joint swelling   []Joint pain   []Low back pain Hematologic:  []Easy bruising  []Easy bleeding   []Hypercoagulable state   [x]Anemic  []Hepatitis Gastrointestinal:  [x]Blood in stool   []Vomiting blood  [x]Gastroesophageal reflux/heartburn   [x]Abdominal pain Genitourinary:  [x]Chronic kidney disease   []Difficult urination  []Frequent urination  []Burning with urination   []Hematuria Skin:  []Rashes   []Ulcers   []Wounds Psychological:  []History of anxiety   [] History of major depression.    Physical Exam BP (!) 151/83 (BP Location: Right Arm)   Pulse 64   Resp 16   Ht 5' 11" (1.803 m)   Wt 82.1 kg (181 lb)   BMI 25.24 kg/m  Gen:  WD/WN, NAD Head: [place]/AT, No  temporalis wasting.  Ear/Nose/Throat: Hearing grossly intact, nares w/o erythema or drainage, oropharynx w/o Erythema/Exudate Eyes: Conjunctiva clear, sclera non-icteric  Neck: trachea midline.  No JVD.  Pulmonary:  Good air movement, respirations not labored, no use of accessory muscles Cardiac: RRR, no JVD Vascular: Catheter in place in right subclavicular  region Vessel Right Left  Radial Palpable Palpable                          PT  trace palpable  1+ palpable  DP  1+ palpable  1+ palpable   Gastrointestinal: soft, non-tender/non-distended. Musculoskeletal: M/S 5/5 throughout.  Dark discoloration present in the first and second toes on the right foot.  There is impending ulceration on the lateral aspect of the right great toe.  Right first and second toes have No deformity or atrophy.  1-2+ right lower extremity edema, trace left lower extremity edema. Neurologic: Sensation grossly intact in extremities.  Symmetrical.  Speech is fluent. Motor exam as listed above. Psychiatric: Judgment intact, Mood & affect appropriate for pt's clinical situation. Dermatologic: Right foot as described above   Radiology No results found.  Labs Recent Results (from the past 2160 hour(s))  Comprehensive metabolic panel     Status: Abnormal   Collection Time: 05/19/17  3:22 PM  Result Value Ref Range   Sodium 129 (L) 135 - 145 mmol/L   Potassium 3.2 (L) 3.5 - 5.1 mmol/L   Chloride 87 (L) 101 - 111 mmol/L   CO2 25 22 - 32 mmol/L   Glucose, Bld 254 (H) 65 - 99 mg/dL   BUN 89 (H) 6 - 20 mg/dL   Creatinine, Ser 14.29 (H) 0.61 - 1.24 mg/dL   Calcium 7.9 (L) 8.9 - 10.3 mg/dL   Total Protein 7.0 6.5 - 8.1 g/dL   Albumin 2.0 (L) 3.5 - 5.0 g/dL   AST 22 15 - 41 U/L   ALT 14 (L) 17 - 63 U/L   Alkaline Phosphatase 64 38 - 126 U/L   Total Bilirubin 0.7 0.3 - 1.2 mg/dL   GFR calc non Af Amer 3 (L) >60 mL/min   GFR calc Af Amer 4 (L) >60 mL/min    Comment: (NOTE) The eGFR has been calculated using  the CKD EPI equation. This calculation has not been validated in all clinical situations. eGFR's persistently <60 mL/min signify possible Chronic Kidney Disease.    Anion gap 17 (H) 5 - 15  CBC with Differential     Status: Abnormal   Collection Time: 05/19/17  3:22 PM  Result Value Ref Range   WBC 17.7 (H) 3.8 - 10.6 K/uL   RBC 3.16 (L) 4.40 - 5.90 MIL/uL   Hemoglobin 8.5 (L) 13.0 - 18.0 g/dL   HCT 26.8 (L) 40.0 - 52.0 %   MCV 84.8 80.0 - 100.0 fL   MCH 26.9 26.0 - 34.0 pg   MCHC 31.7 (L) 32.0 - 36.0 g/dL   RDW 18.6 (H) 11.5 - 14.5 %   Platelets 193 150 - 440 K/uL   Neutrophils Relative % 84 %   Lymphocytes Relative 10 %   Monocytes Relative 3 %   Eosinophils Relative 0 %   Basophils Relative 0 %   Band Neutrophils 2 %   Metamyelocytes Relative 0 %   Myelocytes 1 %   Promyelocytes Absolute 0 %   Blasts 0 %   nRBC 0 0 /100 WBC   Other 0 %   Neutro Abs 15.4 (H) 1.4 - 6.5 K/uL   Lymphs Abs 1.8 1.0 - 3.6 K/uL   Monocytes Absolute 0.5 0.2 - 1.0 K/uL   Eosinophils Absolute 0.0 0 - 0.7 K/uL   Basophils Absolute 0.0 0 - 0.1 K/uL   RBC Morphology POLYCHROMASIA PRESENT     Comment:  MIXED RBC POPULATION   WBC Morphology HYPERSEGMENTED NEUT   Lactic acid, plasma     Status: None   Collection Time: 05/19/17  3:22 PM  Result Value Ref Range   Lactic Acid, Venous 1.5 0.5 - 1.9 mmol/L  Troponin I     Status: Abnormal   Collection Time: 05/19/17  3:22 PM  Result Value Ref Range   Troponin I 0.10 (HH) <0.03 ng/mL    Comment: CRITICAL RESULT CALLED TO, READ BACK BY AND VERIFIED WITH KATIE BUMGARNER 05/19/17 1559 KLW   Culture, blood (routine x 2)     Status: None   Collection Time: 05/19/17  3:22 PM  Result Value Ref Range   Specimen Description BLOOD RIGHT ANTECUBITAL    Special Requests      BOTTLES DRAWN AEROBIC AND ANAEROBIC Blood Culture adequate volume   Culture NO GROWTH 5 DAYS    Report Status 05/24/2017 FINAL   Brain natriuretic peptide     Status: Abnormal   Collection  Time: 05/19/17  3:22 PM  Result Value Ref Range   B Natriuretic Peptide 157.0 (H) 0.0 - 100.0 pg/mL  Protime-INR     Status: Abnormal   Collection Time: 05/19/17  3:22 PM  Result Value Ref Range   Prothrombin Time 15.4 (H) 11.4 - 15.2 seconds   INR 1.21   Pathologist smear review     Status: None   Collection Time: 05/19/17  3:22 PM  Result Value Ref Range   Path Review      Peripheral blood smear reveals mild leukocytosis with neutrophila and slight left shift, suggestive of a reactive process.    Comment: Normocytic anemia. Unremarkable platelets. Reviewed by Dellia Nims Reuel Derby, M.D.   Culture, blood (routine x 2)     Status: None   Collection Time: 05/19/17  3:57 PM  Result Value Ref Range   Specimen Description BLOOD LEFT ANTECUBITAL    Special Requests      BOTTLES DRAWN AEROBIC AND ANAEROBIC Blood Culture results may not be optimal due to an inadequate volume of blood received in culture bottles   Culture NO GROWTH 5 DAYS    Report Status 05/24/2017 FINAL   Rapid HIV screen (HIV 1/2 Ab+Ag)     Status: None   Collection Time: 05/19/17  4:42 PM  Result Value Ref Range   HIV-1 P24 Antigen - HIV24 NON REACTIVE NON REACTIVE   HIV 1/2 Antibodies NON REACTIVE NON REACTIVE   Interpretation (HIV Ag Ab)      A non reactive test result means that HIV 1 or HIV 2 antibodies and HIV 1 p24 antigen were not detected in the specimen.  Hemoglobin     Status: Abnormal   Collection Time: 06/17/17 12:00 PM  Result Value Ref Range   Hemoglobin 6.7 (L) 13.0 - 18.0 g/dL  CBC with Differential     Status: Abnormal   Collection Time: 06/17/17  5:28 PM  Result Value Ref Range   WBC 8.6 3.8 - 10.6 K/uL   RBC 2.61 (L) 4.40 - 5.90 MIL/uL   Hemoglobin 7.5 (L) 13.0 - 18.0 g/dL   HCT 23.1 (L) 40.0 - 52.0 %   MCV 88.3 80.0 - 100.0 fL   MCH 28.6 26.0 - 34.0 pg   MCHC 32.4 32.0 - 36.0 g/dL   RDW 17.2 (H) 11.5 - 14.5 %   Platelets 367 150 - 440 K/uL   Neutrophils Relative % 63 %   Lymphocytes Relative  25 %   Monocytes  Relative 10 %   Eosinophils Relative 1 %   Basophils Relative 1 %   Neutro Abs 5.3 1.4 - 6.5 K/uL   Lymphs Abs 2.2 1.0 - 3.6 K/uL   Monocytes Absolute 0.9 0.2 - 1.0 K/uL   Eosinophils Absolute 0.1 0 - 0.7 K/uL   Basophils Absolute 0.1 0 - 0.1 K/uL   Smear Review MORPHOLOGY UNREMARKABLE   Comprehensive metabolic panel     Status: Abnormal   Collection Time: 06/17/17  5:28 PM  Result Value Ref Range   Sodium 133 (L) 135 - 145 mmol/L   Potassium 2.4 (LL) 3.5 - 5.1 mmol/L    Comment: CRITICAL RESULT CALLED TO, READ BACK BY AND VERIFIED WITH JANE RYAN @ 1832 06/17/17 BY [facility]    Chloride 95 (L) 101 - 111 mmol/L   CO2 29 22 - 32 mmol/L   Glucose, Bld 109 (H) 65 - 99 mg/dL   BUN <5 (L) 6 - 20 mg/dL   Creatinine, Ser 1.69 (H) 0.61 - 1.24 mg/dL   Calcium 7.9 (L) 8.9 - 10.3 mg/dL   Total Protein 7.0 6.5 - 8.1 g/dL   Albumin 2.2 (L) 3.5 - 5.0 g/dL   AST 16 15 - 41 U/L   ALT 8 (L) 17 - 63 U/L   Alkaline Phosphatase 81 38 - 126 U/L   Total Bilirubin 0.6 0.3 - 1.2 mg/dL   GFR calc non Af Amer 44 (L) >60 mL/min   GFR calc Af Amer 51 (L) >60 mL/min    Comment: (NOTE) The eGFR has been calculated using the CKD EPI equation. This calculation has not been validated in all clinical situations. eGFR's persistently <60 mL/min signify possible Chronic Kidney Disease.    Anion gap 9 5 - 15  Type and screen     Status: None   Collection Time: 06/17/17  5:28 PM  Result Value Ref Range   ABO/RH(D) B POS    Antibody Screen NEG    Sample Expiration 37/90/2409   Basic metabolic panel     Status: Abnormal   Collection Time: 06/17/17  9:09 PM  Result Value Ref Range   Sodium 134 (L) 135 - 145 mmol/L   Potassium 2.8 (L) 3.5 - 5.1 mmol/L   Chloride 96 (L) 101 - 111 mmol/L   CO2 30 22 - 32 mmol/L   Glucose, Bld 110 (H) 65 - 99 mg/dL   BUN <5 (L) 6 - 20 mg/dL   Creatinine, Ser 2.11 (H) 0.61 - 1.24 mg/dL   Calcium 8.2 (L) 8.9 - 10.3 mg/dL   GFR calc non Af Amer 34 (L) >60 mL/min    GFR calc Af Amer 39 (L) >60 mL/min    Comment: (NOTE) The eGFR has been calculated using the CKD EPI equation. This calculation has not been validated in all clinical situations. eGFR's persistently <60 mL/min signify possible Chronic Kidney Disease.    Anion gap 8 5 - 15    Assessment/Plan:  Hypercholesteremia lipid control important in reducing the progression of atherosclerotic disease. Continue statin therapy   End stage renal failure on dialysis [facility]) The certainly represents an atherosclerotic risk factor and impedes wound healing.  Type 2 diabetes mellitus with kidney complication, with long-term current use of insulin (HCC) blood glucose control important in reducing the progression of atherosclerotic disease. Also, involved in wound healing. On appropriate medications.   Hypertension blood pressure control important in reducing the progression of atherosclerotic disease. On appropriate oral medications.   Atherosclerosis of native  arteries of extremity with rest pain [facility]) The patient has a clinical appearance of atheroembolus to the right foot and the skin color is now worrisome for early gangrenous changes.  We discussed that this is generally associated with atherosclerotic lesions with distal embolization.  With his renal failure, diabetes, and other risk factors he it is at high risk of limb loss.  I have discussed the grave nature of the situation with the patient.  I recommended angiography be performed at his convenience in the very near future.  I discussed the risks and benefits of this procedure.  He voices his understanding and is agreeable to proceed.      Leotis Pain 08/05/2017, 11:01 AM   This note was created with Dragon medical transcription system.  Any errors from dictation are unintentional.

## 2017-08-05 NOTE — Assessment & Plan Note (Signed)
lipid control important in reducing the progression of atherosclerotic disease. Continue statin therapy  

## 2017-08-05 NOTE — Assessment & Plan Note (Signed)
The patient has a clinical appearance of atheroembolus to the right foot and the skin color is now worrisome for early gangrenous changes.  We discussed that this is generally associated with atherosclerotic lesions with distal embolization.  With his renal failure, diabetes, and other risk factors he it is at high risk of limb loss.  I have discussed the grave nature of the situation with the patient.  I recommended angiography be performed at his convenience in the very near future.  I discussed the risks and benefits of this procedure.  He voices his understanding and is agreeable to proceed.

## 2017-08-05 NOTE — Assessment & Plan Note (Signed)
blood pressure control important in reducing the progression of atherosclerotic disease. On appropriate oral medications.  

## 2017-08-05 NOTE — Patient Instructions (Signed)

## 2017-08-08 ENCOUNTER — Ambulatory Visit
Admission: RE | Admit: 2017-08-08 | Discharge: 2017-08-08 | Disposition: A | Discharge status: Home / Self Care | Payer: BLUE CROSS/BLUE SHIELD | Source: Ambulatory Visit | Attending: Vascular Surgery | Admitting: Vascular Surgery

## 2017-08-08 ENCOUNTER — Encounter
Admission: RE | Disposition: A | Discharge status: Home / Self Care | Payer: Self-pay | Source: Ambulatory Visit | Attending: Vascular Surgery

## 2017-08-08 DIAGNOSIS — I70221 Atherosclerosis of native arteries of extremities with rest pain, right leg: Secondary | ICD-10-CM | POA: Insufficient documentation

## 2017-08-08 DIAGNOSIS — Z8249 Family history of ischemic heart disease and other diseases of the circulatory system: Secondary | ICD-10-CM | POA: Insufficient documentation

## 2017-08-08 DIAGNOSIS — L819 Disorder of pigmentation, unspecified: Secondary | ICD-10-CM | POA: Diagnosis not present

## 2017-08-08 DIAGNOSIS — Z8679 Personal history of other diseases of the circulatory system: Secondary | ICD-10-CM | POA: Insufficient documentation

## 2017-08-08 DIAGNOSIS — E1129 Type 2 diabetes mellitus with other diabetic kidney complication: Secondary | ICD-10-CM | POA: Diagnosis not present

## 2017-08-08 DIAGNOSIS — Z7982 Long term (current) use of aspirin: Secondary | ICD-10-CM | POA: Insufficient documentation

## 2017-08-08 DIAGNOSIS — N186 End stage renal disease: Secondary | ICD-10-CM | POA: Diagnosis not present

## 2017-08-08 DIAGNOSIS — K5090 Crohn's disease, unspecified, without complications: Secondary | ICD-10-CM | POA: Diagnosis not present

## 2017-08-08 DIAGNOSIS — Z9889 Other specified postprocedural states: Secondary | ICD-10-CM | POA: Insufficient documentation

## 2017-08-08 DIAGNOSIS — I120 Hypertensive chronic kidney disease with stage 5 chronic kidney disease or end stage renal disease: Secondary | ICD-10-CM | POA: Insufficient documentation

## 2017-08-08 DIAGNOSIS — Z794 Long term (current) use of insulin: Secondary | ICD-10-CM | POA: Insufficient documentation

## 2017-08-08 DIAGNOSIS — Z833 Family history of diabetes mellitus: Secondary | ICD-10-CM | POA: Insufficient documentation

## 2017-08-08 DIAGNOSIS — E1122 Type 2 diabetes mellitus with diabetic chronic kidney disease: Secondary | ICD-10-CM | POA: Insufficient documentation

## 2017-08-08 DIAGNOSIS — Z992 Dependence on renal dialysis: Secondary | ICD-10-CM | POA: Insufficient documentation

## 2017-08-08 DIAGNOSIS — L732 Hidradenitis suppurativa: Secondary | ICD-10-CM | POA: Insufficient documentation

## 2017-08-08 DIAGNOSIS — Z86718 Personal history of other venous thrombosis and embolism: Secondary | ICD-10-CM | POA: Diagnosis not present

## 2017-08-08 DIAGNOSIS — E7800 Pure hypercholesterolemia, unspecified: Secondary | ICD-10-CM | POA: Insufficient documentation

## 2017-08-08 DIAGNOSIS — Z8261 Family history of arthritis: Secondary | ICD-10-CM | POA: Insufficient documentation

## 2017-08-08 DIAGNOSIS — Z8379 Family history of other diseases of the digestive system: Secondary | ICD-10-CM | POA: Insufficient documentation

## 2017-08-08 HISTORY — PX: LOWER EXTREMITY ANGIOGRAPHY: CATH118251

## 2017-08-08 HISTORY — PX: LOWER EXTREMITY INTERVENTION: CATH118252

## 2017-08-08 LAB — POTASSIUM (ARMC VASCULAR LAB ONLY): Potassium (ARMC vascular lab): 3.8 (ref 3.5–5.1)

## 2017-08-08 LAB — GLUCOSE, CAPILLARY: Glucose-Capillary: 159 mg/dL — ABNORMAL HIGH (ref 65–99)

## 2017-08-08 SURGERY — LOWER EXTREMITY ANGIOGRAPHY
Anesthesia: Moderate Sedation | Laterality: Right

## 2017-08-08 MED ORDER — FAMOTIDINE 20 MG PO TABS: 40.0000 mg | ORAL_TABLET | ORAL | Status: DC | PRN

## 2017-08-08 MED ORDER — SODIUM CHLORIDE 0.9 % IV SOLN
INTRAVENOUS | Status: DC
  Administered 2017-08-08: 10:00:00 via INTRAVENOUS

## 2017-08-08 MED ORDER — HEPARIN SODIUM (PORCINE) 1000 UNIT/ML IJ SOLN
INTRAMUSCULAR | Status: AC
  Filled 2017-08-08: qty 1

## 2017-08-08 MED ORDER — CLOPIDOGREL BISULFATE 75 MG PO TABS: 75.0000 mg | ORAL_TABLET | Freq: Every day | ORAL | 11 refills | Status: DC

## 2017-08-08 MED ORDER — HEPARIN (PORCINE) IN NACL 2-0.9 UNIT/ML-% IJ SOLN
INTRAMUSCULAR | Status: AC
  Filled 2017-08-08: qty 1000

## 2017-08-08 MED ORDER — IOPAMIDOL (ISOVUE-300) INJECTION 61%
INTRAVENOUS | Status: DC | PRN
  Administered 2017-08-08: 85 mL via INTRAVENOUS

## 2017-08-08 MED ORDER — HYDROMORPHONE HCL 1 MG/ML IJ SOLN: 1.0000 mg | Freq: Once | INTRAMUSCULAR | Status: DC | PRN

## 2017-08-08 MED ORDER — LIDOCAINE-EPINEPHRINE (PF) 1 %-1:200000 IJ SOLN
INTRAMUSCULAR | Status: AC
  Filled 2017-08-08: qty 30

## 2017-08-08 MED ORDER — NITROGLYCERIN 5 MG/ML IV SOLN
INTRAVENOUS | Status: AC
  Filled 2017-08-08: qty 10

## 2017-08-08 MED ORDER — METHYLPREDNISOLONE SODIUM SUCC 125 MG IJ SOLR: 125.0000 mg | INTRAMUSCULAR | Status: DC | PRN

## 2017-08-08 MED ORDER — ONDANSETRON HCL 4 MG/2ML IJ SOLN: 4.0000 mg | Freq: Four times a day (QID) | INTRAMUSCULAR | Status: DC | PRN

## 2017-08-08 MED ORDER — SODIUM CHLORIDE 0.9 % IJ SOLN
INTRAMUSCULAR | Status: AC
  Filled 2017-08-08: qty 50

## 2017-08-08 MED ORDER — CEFAZOLIN SODIUM-DEXTROSE 2-4 GM/100ML-% IV SOLN
INTRAVENOUS | Status: AC
  Filled 2017-08-08: qty 100

## 2017-08-08 MED ORDER — MIDAZOLAM HCL 5 MG/5ML IJ SOLN
INTRAMUSCULAR | Status: AC
  Filled 2017-08-08: qty 5

## 2017-08-08 MED ORDER — MIDAZOLAM HCL 2 MG/2ML IJ SOLN
INTRAMUSCULAR | Status: DC | PRN
  Administered 2017-08-08: 2 mg via INTRAVENOUS

## 2017-08-08 MED ORDER — HEPARIN SODIUM (PORCINE) 1000 UNIT/ML IJ SOLN
INTRAMUSCULAR | Status: DC | PRN
  Administered 2017-08-08: 4000 [IU] via INTRAVENOUS

## 2017-08-08 MED ORDER — CEFAZOLIN SODIUM-DEXTROSE 2-4 GM/100ML-% IV SOLN: 2.0000 g | Freq: Once | INTRAVENOUS | Status: DC

## 2017-08-08 MED ORDER — FENTANYL CITRATE (PF) 100 MCG/2ML IJ SOLN
INTRAMUSCULAR | Status: AC
  Filled 2017-08-08: qty 2

## 2017-08-08 MED ORDER — FENTANYL CITRATE (PF) 100 MCG/2ML IJ SOLN
INTRAMUSCULAR | Status: DC | PRN
Start: 2017-08-08 — End: 2017-08-08
  Administered 2017-08-08: 50 ug via INTRAVENOUS

## 2017-08-08 SURGICAL SUPPLY — 17 items
BALLN LUTONIX DCB 6X80X130 (BALLOONS) ×3
BALLN ULTRVRSE 2X150X150 (BALLOONS) ×1
BALLN ULTRVRSE 2X150X150 OTW (BALLOONS) ×2
BALLOON LUTONIX DCB 6X80X130 (BALLOONS) ×2 IMPLANT
BALLOON ULTRVRSE 2X150X150 OTW (BALLOONS) ×2 IMPLANT
CATH BEACON 5 .038 100 VERT TP (CATHETERS) ×3 IMPLANT
CATH PIG 70CM (CATHETERS) ×3 IMPLANT
DEVICE PRESTO INFLATION (MISCELLANEOUS) ×3 IMPLANT
DEVICE STARCLOSE SE CLOSURE (Vascular Products) ×3 IMPLANT
PACK ANGIOGRAPHY (CUSTOM PROCEDURE TRAY) ×3 IMPLANT
SHEATH ANL2 6FRX45 HC (SHEATH) ×3 IMPLANT
SHEATH BRITE TIP 5FRX11 (SHEATH) ×3 IMPLANT
SYR MEDRAD MARK V 150ML (SYRINGE) ×3 IMPLANT
TUBING CONTRAST HIGH PRESS 72 (TUBING) ×3 IMPLANT
WIRE G V18X300CM (WIRE) ×3 IMPLANT
WIRE J 3MM .035X145CM (WIRE) ×3 IMPLANT
WIRE MAGIC TORQUE 260C (WIRE) ×3 IMPLANT

## 2017-08-08 NOTE — H&P (Signed)
[facility] History & Physical Update  The patient was interviewed and re-examined.  The patient's previous History and Physical has been reviewed and is unchanged.  There is no change in the plan of care. We plan to proceed with the scheduled procedure.  Leotis Pain, MD  08/08/2017, 9:24 AM

## 2017-08-08 NOTE — Op Note (Signed)
[facility] Percutaneous Study/Intervention Procedural Note   Date of Surgery: 08/08/2017  Surgeon(s):Kurt Hoffmeier   Assistants:none  Pre-operative Diagnosis: PAD with rest pain and early gangrenous changes right foot  Post-operative diagnosis: Same  Procedure(s) Performed: 1. Ultrasound guidance for vascular access left femoral artery 2. Catheter placement into right posterior tibial artery from left femoral approach 3. Aortogram and selective right lower extremity angiogram 4. Percutaneous transluminal angioplasty of right proximal to mid superficial femoral artery with 6 mm diameter by 8 cm length Lutonix drug-coated angioplasty balloon 5. Percutaneous transluminal angioplasty of the right distal posterior tibial artery with 2 mm diameter by 15 cm length angioplasty balloon  6. StarClose closure device femoral artery  EBL: 5 cc  Contrast: 85 cc  Fluoro Time: 5.2 minutes  Moderate Conscious Sedation Time: approximately 35 minutes using 2 mg of Versed and 50 Mcg of Fentanyl  Indications: Patient is a 56 y.o.male with severe pain in the right foot and toes. The patient has dark discoloration of the first and second toe on the right foot with what appeared to be early gangrenous changes. The patient is brought in for angiography for further evaluation and potential treatment. Risks and benefits are discussed and informed consent is obtained  Procedure: The patient was identified and appropriate procedural time out was performed. The patient was then placed supine on the table and prepped and draped in the usual sterile fashion.Moderate conscious sedation was administered during a face to face encounter with the patient throughout the procedure with my supervision of the RN administering medicines and monitoring the patient's vital signs, pulse oximetry, telemetry and mental status  throughout from the start of the procedure until the patient was taken to the recovery room. Ultrasound was used to evaluate the left common femoral artery. It was patent . A digital ultrasound image was acquired. A Seldinger needle was used to access the left common femoral artery under direct ultrasound guidance and a permanent image was performed. A 0.035 J wire was advanced without resistance and a 5Fr sheath was placed. Pigtail catheter was placed into the aorta and an AP aortogram was performed. This demonstrated normal renal arteries and normal aorta and iliac segments without significant stenosis. I then crossed the aortic bifurcation and advanced to the right femoral head. Selective right lower extremity angiogram was then performed.  Distal opacification was poor due to a slow circulation time, and a catheter had to be placed in the popliteal artery to visualize the tibial vessels.  This demonstrated highly calcific vessels with a normal common femoral artery and profunda femoris artery.  There was a focal irregular stenosis of the right proximal to mid superficial femoral artery of about 60%.  The vessel then normalized and was normal throughout the popliteal artery down to the tibial trifurcation.  The anterior tibial artery appeared to have a mild to moderate stenosis at its origin, but it occluded distally at the level of the ankle without reconstitution in the foot.  The peroneal artery was the best runoff distally.  The posterior tibial artery was normal in the proximal mid segment, and had a short segment occlusion in the distal segment and then became reasonably normal for several centimeters until it became faint and terminated in the foot. The patient was systemically heparinized and a 6 [place] Ansell sheath was then placed over the [employer] wire. I then used a Kumpe catheter and the Magic torque wire to navigate through the SFA lesion.  This was treated  with a 6 mm diameter by 8 cm  length Lutonix drug-coated angioplasty balloon inflated to 10 atm in the proximal to mid superficial femoral artery.  Completion angiogram showed only about a 15% residual stenosis without dissection.  I then exchanged for 0.018 wire through the Kumpe catheter.  I did not feel like treating the proximal anterior tibial stenosis would be of much benefit as it did not have significant collaterals and is occluded above the foot.  I then tried to treat the posterior tibial lesion.  There was a short segment occlusion before the vessel then later terminated in the foot.  There was still significant intrinsic foot disease that we would not be able to help much with.  I was able to cross this lesion without much difficulty with the V 18 wire.  A 2 mm diameter by 15 cm length angioplasty balloon was then taken down to just above the ankle and the posterior tibial artery on the right.  This was inflated to 15 atm for 1 minute.  Completion angiogram showed still sluggish flow with no significant blood flow beyond the area of stenosis/occlusion in the flow in the foot really was not made much better.  This was likely a combination of intrinsic foot disease as well as some vasospasm.  I gave some intra-arterial nitroglycerin and remove the wire but the flow remained sluggish.  The peroneal artery remained continuous without focal stenosis felt we had done all that we could to help his disease at this point. I elected to terminate the procedure. The sheath was removed and StarClose closure device was deployed in the left femoral artery with excellent hemostatic result. The patient was taken to the recovery room in stable condition having tolerated the procedure well.  Findings:  Aortogram: normal renal arteries and normal aorta and iliac segments without significant stenosis Right lower Extremity:Highly calcific vessels with a normal common femoral artery and profunda femoris artery.  There was a  focal irregular stenosis of the right proximal to mid superficial femoral artery of about 60%.  The vessel then normalized and was normal throughout the popliteal artery down to the tibial trifurcation.  The anterior tibial artery appeared to have a mild to moderate stenosis at its origin, but it occluded distally at the level of the ankle without reconstitution in the foot.  The peroneal artery was the best runoff distally.  The posterior tibial artery was normal in the proximal mid segment, and had a short segment occlusion in the distal segment and then became reasonably normal for several centimeters until it became faint and terminated in the foot.   Disposition: Patient was taken to the recovery room in stable condition having tolerated the procedure well.  Complications: None  Leotis Pain 08/08/2017 11:17 AM   This note was created with Dragon Medical transcription system. Any errors in dictation are purely unintentional.

## 2017-08-08 NOTE — Discharge Instructions (Signed)
Angiogram, Care After °This sheet gives you information about how to care for yourself after your procedure. Your doctor may also give you more specific instructions. If you have problems or questions, contact your doctor. °Follow these instructions at home: °Insertion site care °· Follow instructions from your doctor about how to take care of your long, thin tube (catheter) insertion area. Make sure you: °? Wash your hands with soap and water before you change your bandage (dressing). If you cannot use soap and water, use hand sanitizer. °? Change your bandage as told by your doctor. °? Leave stitches (sutures), skin glue, or skin tape (adhesive) strips in place. They may need to stay in place for 2 weeks or longer. If tape strips get loose and curl up, you may trim the loose edges. Do not remove tape strips completely unless your doctor says it is okay. °· Do not take baths, swim, or use a hot tub until your doctor says it is okay. °· You may shower 24-48 hours after the procedure or as told by your doctor. °? Gently wash the area with plain soap and water. °? Pat the area dry with a clean towel. °? Do not rub the area. This may cause bleeding. °· Do not apply powder or lotion to the area. Keep the area clean and dry. °· Check your insertion area every day for signs of infection. Check for: °? More redness, swelling, or pain. °? Fluid or blood. °? Warmth. °? Pus or a bad smell. °Activity °· Rest as told by your doctor, usually for 1-2 days. °· Do not lift anything that is heavier than 10 lbs. (4.5 kg) or as told by your doctor. °· Do not drive for 24 hours if you were given a medicine to help you relax (sedative). °· Do not drive or use heavy machinery while taking prescription pain medicine. °General instructions °· Go back to your normal activities as told by your doctor, usually in about a week. Ask your doctor what activities are safe for you. °· If the insertion area starts to bleed, lie flat and put pressure  on the area. If the bleeding does not stop, get help right away. This is an emergency. °· Drink enough fluid to keep your pee (urine) clear or pale yellow. °· Take over-the-counter and prescription medicines only as told by your doctor. °· Keep all follow-up visits as told by your doctor. This is important. °Contact a doctor if: °· You have a fever. °· You have chills. °· You have more redness, swelling, or pain around your insertion area. °· You have fluid or blood coming from your insertion area. °· The insertion area feels warm to the touch. °· You have pus or a bad smell coming from your insertion area. °· You have more bruising around the insertion area. °· Blood collects in the tissue around the insertion area (hematoma) that may be painful to the touch. °Get help right away if: °· You have a lot of pain in the insertion area. °· The insertion area swells very fast. °· The insertion area is bleeding, and the bleeding does not stop after holding steady pressure on the area. °· The area near or just beyond the insertion area becomes pale, cool, tingly, or numb. °These symptoms may be an emergency. Do not wait to see if the symptoms will go away. Get medical help right away. Call your local emergency services (911 in the [place] Do not drive yourself to the hospital. °Summary °·   After the procedure, it is common to have bruising and tenderness at the long, thin tube insertion area. °· After the procedure, it is important to rest and drink plenty of fluids. °· Do not take baths, swim, or use a hot tub until your doctor says it is okay to do so. You may shower 24-48 hours after the procedure or as told by your doctor. °· If the insertion area starts to bleed, lie flat and put pressure on the area. If the bleeding does not stop, get help right away. This is an emergency. °This information is not intended to replace advice given to you by your health care provider. Make sure you discuss any questions you have with  your health care provider. °Document Released: 12/31/2008 Document Revised: 09/28/2016 Document Reviewed: 09/28/2016 °Elsevier Interactive Patient Education © 2017 [employer] °Moderate Conscious Sedation, Adult, Care After °These instructions provide you with information about caring for yourself after your procedure. Your health care provider may also give you more specific instructions. Your treatment has been planned according to current medical practices, but problems sometimes occur. Call your health care provider if you have any problems or questions after your procedure. °What can I expect after the procedure? °After your procedure, it is common: °· To feel sleepy for several hours. °· To feel clumsy and have poor balance for several hours. °· To have poor judgment for several hours. °· To vomit if you eat too soon. ° °Follow these instructions at home: °For at least 24 hours after the procedure: ° °· Do not: °? Participate in activities where you could fall or become injured. °? Drive. °? Use heavy machinery. °? Drink alcohol. °? Take sleeping pills or medicines that cause drowsiness. °? Make important decisions or sign legal documents. °? Take care of children on your own. °· Rest. °Eating and drinking °· Follow the diet recommended by your health care provider. °· If you vomit: °? Drink water, juice, or soup when you can drink without vomiting. °? Make sure you have little or no nausea before eating solid foods. °General instructions °· Have a responsible adult stay with you until you are awake and alert. °· Take over-the-counter and prescription medicines only as told by your health care provider. °· If you smoke, do not smoke without supervision. °· Keep all follow-up visits as told by your health care provider. This is important. °Contact a health care provider if: °· You keep feeling nauseous or you keep vomiting. °· You feel light-headed. °· You develop a rash. °· You have a fever. °Get help right  away if: °· You have trouble breathing. °This information is not intended to replace advice given to you by your health care provider. Make sure you discuss any questions you have with your health care provider. °Document Released: 07/25/2013 Document Revised: 03/08/2016 Document Reviewed: 01/24/2016 °Elsevier Interactive Patient Education © 2018 [employer] ° °

## 2017-08-09 ENCOUNTER — Encounter: Payer: Self-pay | Admitting: Vascular Surgery

## 2017-08-11 ENCOUNTER — Telehealth: Payer: Self-pay | Admitting: Podiatry

## 2017-08-11 NOTE — Telephone Encounter (Signed)
This is my second message. I'm calling back from the message I left earlier. I went and saw the vascular doctor like Dr. Amalia Hailey told me to do. I'm still in a lot of pain and I wanted to know if Dr. Amalia Hailey can see me for the ingrown nail procedure. Please call me back at [phone].

## 2017-08-11 NOTE — Telephone Encounter (Signed)
Returned patient call, he states that he was seen by Dr. Lucky Cowboy and had procedure done, but still experiencing a lot of pain in the great and 2nd toe.   I informed him that he should call the vascular doctor and have him reevaluate his legs since he was having a lot of pain after the procedure.  Patient verbalized understanding.

## 2017-08-11 NOTE — Telephone Encounter (Signed)
I saw Dr. Amalia Hailey about a week ago and he suggested I see a vein specialist. I did and they did the test. I'm in a lot of pain from the ingrown toenails and want to know if Dr. Amalia Hailey can do something about them now. Please call me back at [phone] and you can leave me a message. Thank you.

## 2017-08-12 ENCOUNTER — Telehealth (INDEPENDENT_AMBULATORY_CARE_PROVIDER_SITE_OTHER): Payer: Self-pay

## 2017-08-12 NOTE — Telephone Encounter (Signed)
Spoke with the patient and let him know Dr. Bunnie Domino recommendations. The patient is scheduled on Tuesday for an ABI and to be seen.

## 2017-08-12 NOTE — Telephone Encounter (Signed)
Patient called stating his foot hurts much worse and in his groin area there is a wire sticking out from his angio on 08/08/17.

## 2017-08-13 ENCOUNTER — Emergency Department
Admission: EM | Admit: 2017-08-13 | Discharge: 2017-08-13 | Disposition: A | Discharge status: Home / Self Care | Payer: BLUE CROSS/BLUE SHIELD | Attending: Emergency Medicine | Admitting: Emergency Medicine

## 2017-08-13 ENCOUNTER — Emergency Department: Payer: BLUE CROSS/BLUE SHIELD

## 2017-08-13 ENCOUNTER — Encounter: Payer: Self-pay | Admitting: Emergency Medicine

## 2017-08-13 DIAGNOSIS — Z7982 Long term (current) use of aspirin: Secondary | ICD-10-CM | POA: Insufficient documentation

## 2017-08-13 DIAGNOSIS — M79674 Pain in right toe(s): Secondary | ICD-10-CM

## 2017-08-13 DIAGNOSIS — E119 Type 2 diabetes mellitus without complications: Secondary | ICD-10-CM | POA: Insufficient documentation

## 2017-08-13 DIAGNOSIS — I10 Essential (primary) hypertension: Secondary | ICD-10-CM | POA: Insufficient documentation

## 2017-08-13 DIAGNOSIS — Z79899 Other long term (current) drug therapy: Secondary | ICD-10-CM | POA: Diagnosis not present

## 2017-08-13 DIAGNOSIS — M79671 Pain in right foot: Secondary | ICD-10-CM | POA: Diagnosis present

## 2017-08-13 DIAGNOSIS — Z7902 Long term (current) use of antithrombotics/antiplatelets: Secondary | ICD-10-CM | POA: Insufficient documentation

## 2017-08-13 LAB — COMPREHENSIVE METABOLIC PANEL
ALT: <5 U/L — ABNORMAL LOW (ref 17–63)
AST: 15 U/L (ref 15–41)
Albumin: 3.1 g/dL — ABNORMAL LOW (ref 3.5–5.0)
Alkaline Phosphatase: 88 U/L (ref 38–126)
Anion gap: 12 (ref 5–15)
BUN: 14 mg/dL (ref 6–20)
CO2: 30 mmol/L (ref 22–32)
Calcium: 9.4 mg/dL (ref 8.9–10.3)
Chloride: 97 mmol/L — ABNORMAL LOW (ref 101–111)
Creatinine, Ser: 4.49 mg/dL — ABNORMAL HIGH (ref 0.61–1.24)
GFR calc Af Amer: 16 mL/min — ABNORMAL LOW (ref >60)
GFR calc non Af Amer: 13 mL/min — ABNORMAL LOW (ref >60)
Glucose, Bld: 179 mg/dL — ABNORMAL HIGH (ref 65–99)
Potassium: 3.6 mmol/L (ref 3.5–5.1)
Sodium: 139 mmol/L (ref 135–145)
Total Bilirubin: 0.7 mg/dL (ref 0.3–1.2)
Total Protein: 8 g/dL (ref 6.5–8.1)

## 2017-08-13 LAB — CBC WITH DIFFERENTIAL/PLATELET
Basophils Absolute: 0 10*3/uL (ref 0–0.1)
Basophils Relative: 1 %
Eosinophils Absolute: 0.1 10*3/uL (ref 0–0.7)
Eosinophils Relative: 1 %
HCT: 27.7 % — ABNORMAL LOW (ref 40.0–52.0)
Hemoglobin: 8.9 g/dL — ABNORMAL LOW (ref 13.0–18.0)
Lymphocytes Relative: 19 %
Lymphs Abs: 1.1 10*3/uL (ref 1.0–3.6)
MCH: 25.8 pg — ABNORMAL LOW (ref 26.0–34.0)
MCHC: 32.1 g/dL (ref 32.0–36.0)
MCV: 80.6 fL (ref 80.0–100.0)
Monocytes Absolute: 0.4 10*3/uL (ref 0.2–1.0)
Monocytes Relative: 7 %
Neutro Abs: 4.3 10*3/uL (ref 1.4–6.5)
Neutrophils Relative %: 72 %
Platelets: 239 10*3/uL (ref 150–440)
RBC: 3.43 MIL/uL — ABNORMAL LOW (ref 4.40–5.90)
RDW: 17.6 % — ABNORMAL HIGH (ref 11.5–14.5)
WBC: 6 10*3/uL (ref 3.8–10.6)

## 2017-08-13 LAB — CK: Total CK: 43 U/L — ABNORMAL LOW (ref 49–397)

## 2017-08-13 LAB — APTT: aPTT: 34 seconds (ref 24–36)

## 2017-08-13 LAB — PROTIME-INR
INR: 1.02
Prothrombin Time: 13.3 seconds (ref 11.4–15.2)

## 2017-08-13 MED ORDER — OXYCODONE-ACETAMINOPHEN 5-325 MG PO TABS: 1.0000 | ORAL_TABLET | ORAL | 0 refills | Status: DC | PRN

## 2017-08-13 MED ORDER — CEPHALEXIN 500 MG PO CAPS: 500.0000 mg | ORAL_CAPSULE | Freq: Four times a day (QID) | ORAL | 0 refills | Status: AC

## 2017-08-13 NOTE — Discharge Instructions (Signed)
Please seek medical attention for any high fevers, chest pain, shortness of breath, change in behavior, persistent vomiting, bloody stool or any other new or concerning symptoms.  

## 2017-08-13 NOTE — ED Provider Notes (Signed)
[facility] Emergency Department Provider Note   ____________________________________________   I have reviewed the triage vital signs and the nursing notes.   HISTORY  Chief Complaint Leg Pain   History limited by: Not Limited   HPI [name] is a 56 y.o. male who presents to the emergency department today because of concern for right toe pain.   LOCATION:right great and 2nd toe DURATION:at least a month TIMING: fairly constant, worsening SEVERITY: severe CONTEXT: patient has recently undergone angioplasty by vascular surgery on the right foot five days ago.   MODIFYING FACTORS: has been taking tylenol at home without significant relief ASSOCIATED SYMPTOMS: denies any fevers. Denies chest pain. No shortness of breath.  Per medical record review patient has a history of CKD on dialysis. Recent angioplasty.  Past Medical History:  Diagnosis Date  . Anemia   . Crohn disease ([place])   . Diabetes mellitus without complication ([place])   . DVT of lower extremity (deep venous thrombosis) ([place]) 2016  . Hidradenitis suppurativa   . Hypertension   . ICH (intracerebral hemorrhage) ([place])   . Renal disorder     Patient Active Problem List   Diagnosis Date Noted  . Atherosclerosis of native arteries of extremity with rest pain ([place]) 08/05/2017  . Empyema ([place]) 05/20/2017  . Peritonitis ([place]) 04/21/2017  . History of CVA (cerebrovascular accident) 04/15/2017  . Seizure ([place]) 04/15/2017  . End stage renal failure on dialysis ([place]) 04/12/2016  . Aphthae 02/20/2016  . Hidradenitis 02/20/2016  . Leg pain 02/20/2016  . Microalbuminuria 02/20/2016  . Neuropathy 02/20/2016  . Chest pain 02/20/2016  . Pyogenic arthritis of knee ([place]) 02/04/2016  . Narrowing of intervertebral disc space 08/29/2015  . Vascular disorder of lower extremity 08/29/2015  . Failure of erection 08/29/2015  . Accumulation of fluid in tissues 08/29/2015  . Hypercholesteremia  08/29/2015  . Hypertension 08/29/2015  . Anemia due to chronic kidney disease 05/26/2015  . Venous insufficiency of leg 09/04/2014  . Deep vein thrombosis of lower extremity ([place]) 08/16/2014  . Prostatic intraepithelial neoplasia 11/02/2013  . Elevated prostate specific antigen (PSA) 09/11/2013  . Benign prostatic hyperplasia with urinary obstruction 08/13/2013  . Spermatocele 08/13/2013  . Avitaminosis D 01/25/2013  . Abnormal presence of protein in urine 03/28/2012  . Type 2 diabetes mellitus with kidney complication, with long-term current use of insulin ([place]) 03/28/2012  . Crohn's disease ([place]) 08/03/2011    Past Surgical History:  Procedure Laterality Date  . ABDOMINAL SURGERY    . KNEE SURGERY Left 02/04/2016   [facility]  . LOWER EXTREMITY ANGIOGRAPHY Right 08/08/2017   Procedure: Lower Extremity Angiography;  Surgeon: Algernon Huxley, MD;  Location: [facility];  Service: Cardiovascular;  Laterality: Right;  . LOWER EXTREMITY INTERVENTION  08/08/2017   Procedure: LOWER EXTREMITY INTERVENTION;  Surgeon: Algernon Huxley, MD;  Location: [facility];  Service: Cardiovascular;;    Prior to Admission medications   Medication Sig Start Date End Date Taking? Authorizing Provider  amLODipine (NORVASC) 10 MG tablet Take 1 tablet (10 mg total) by mouth daily as needed. 06/01/17   [name], MD  aspirin EC 81 MG tablet Take 81 mg by mouth daily.    [provider]  atorvastatin (LIPITOR) 80 MG tablet Take 1 tablet (80 mg total) by mouth daily. 06/09/17   [name], MD  calcitRIOL (ROCALTROL) 0.25 MCG capsule Take 0.5 mcg by mouth daily.  11/02/15   [provider]  carvedilol (COREG) 25 MG tablet Take 12.5 mg by mouth 2 (two) times daily.  11/02/15   [provider]  clopidogrel (PLAVIX) 75 MG tablet Take 1 tablet (75 mg total) by mouth daily. 08/08/17   Algernon Huxley, MD  furosemide (LASIX) 80 MG tablet Take 1 tablet (80 mg total) by mouth 2  (two) times daily. 06/01/17   [name], MD  glucose blood (BAYER CONTOUR TEST) test strip Check sugar three times daily for insulin dependent diabetes 12/10/16   [name], MD  Hydrocodone-Acetaminophen (VICODIN) 5-300 MG TABS Take 1 tablet by mouth every 8 (eight) hours as needed. 08/02/17   Edrick Kins, DPM  inFLIXimab (REMICADE) 100 MG injection Inject into the vein every 6 (six) weeks.     [provider]  insulin aspart (NOVOLOG FLEXPEN) 100 UNIT/ML FlexPen Take 8-15 units three times a day before each meal 08/18/16   [name], [name], MD  insulin glargine (LANTUS) 100 UNIT/ML injection Inject 0.4 mLs (40 Units total) into the skin daily at 10 pm. Patient taking differently: Inject 7 Units into the skin at bedtime.  04/25/17   Epifanio Lesches, MD  levETIRAcetam (KEPPRA) 1000 MG tablet Take 1,000 mg by mouth at bedtime.  04/20/17   [provider]  lisinopril (PRINIVIL,ZESTRIL) 20 MG tablet Take 1 tablet (20 mg total) by mouth daily. 05/12/17   [name], MD  nystatin (MYCOSTATIN) 100000 UNIT/ML suspension Take 5 mLs (500,000 Units total) by mouth 4 (four) times daily. 05/16/17   [name], MD  oxyCODONE (OXY IR/ROXICODONE) 5 MG immediate release tablet Take 1 tablet by mouth as needed. 05/30/17   [provider]  polyethylene glycol (MIRALAX / GLYCOLAX) packet Take 1 packet by mouth as needed. 05/30/17   [provider]  sildenafil (VIAGRA) 100 MG tablet Take 1 tablet (100 mg total) by mouth daily as needed. 04/27/16   [name], MD  sodium bicarbonate 650 MG tablet Take 650 mg by mouth 2 (two) times daily.    [provider]  triamcinolone ointment (KENALOG) 0.1 % Apply 1 application topically as needed.  05/02/17   [provider]    Allergies Vancomycin; Methotrexate; and Tape  Family History  Problem Relation Age of Onset  . Irritable bowel syndrome Sister   . Diabetes Sister   . Heart disease Mother    . Diabetes Mother   . Heart disease Father   . Rheumatic fever Father        as child  . Psoriasis Brother   . Arthritis Brother   . Diabetes Sister   . Diabetes Sister     Social History Social History  Substance Use Topics  . Smoking status: Never Smoker  . Smokeless tobacco: Never Used  . Alcohol use No   Review of Systems Constitutional: No fever/chills Eyes: No visual changes. ENT: No sore throat. Cardiovascular: Denies chest pain. Respiratory: Denies shortness of breath. Gastrointestinal: No abdominal pain.  No nausea, no vomiting.  No diarrhea.   Genitourinary: Negative for dysuria. Musculoskeletal: Positive for right 1st and 2nd toe.  Skin: Positive for skin change over the right 1st and 2nd toe Neurological: Negative for headaches, focal weakness or numbness.  ____________________________________________   PHYSICAL EXAM:  VITAL SIGNS: ED Triage Vitals  Enc Vitals Group     BP 08/13/17 1403 (!) 154/80     Pulse Rate 08/13/17 1403 67     Resp 08/13/17 1403 16  Temp 08/13/17 1403 98.9 F (37.2 C)     Temp Source 08/13/17 1403 Oral     SpO2 08/13/17 1403 100 %     Weight 08/13/17 1403 180 lb (81.6 kg)     Height 08/13/17 1403 5\' 11"  (1.803 m)     Head Circumference --      Peak Flow --      Pain Score 08/13/17 1402 10   Constitutional: Alert and oriented. Well appearing and in no distress. Eyes: Conjunctivae are normal.  ENT   Head: Normocephalic and atraumatic.   Nose: No congestion/rhinnorhea.   Mouth/Throat: Mucous membranes are moist.   Neck: No stridor. Hematological/Lymphatic/Immunilogical: No cervical lymphadenopathy. Cardiovascular: Normal rate, regular rhythm.  No murmurs, rubs, or gallops.  Respiratory: Normal respiratory effort without tachypnea nor retractions. Breath sounds are clear and equal bilaterally. No wheezes/rales/rhonchi. Gastrointestinal: Soft and non tender. No rebound. No guarding.  Genitourinary:  Deferred Musculoskeletal: Some blackness to the tip of the right 1st and 2nd toe. Some surrounding erythema. Faintly palpable right DP.  Neurologic:  Normal speech and language. No gross focal neurologic deficits are appreciated.  Skin:  Blackness and redness to tips of right 1st and 2nd toe.  Psychiatric: Mood and affect are normal. Speech and behavior are normal. Patient exhibits appropriate insight and judgment.  ____________________________________________    LABS (pertinent positives/negatives)  INR 1.02 CK 43 CBC hgb 8.9, WBC 6.0 CMP Cr 4.49  ____________________________________________   EKG  None  ____________________________________________    RADIOLOGY  Right foot No acute findings  ____________________________________________   PROCEDURES  Procedures  ____________________________________________   INITIAL IMPRESSION / ASSESSMENT AND PLAN / ED COURSE  Pertinent labs & imaging results that were available during my care of the patient were reviewed by me and considered in my medical decision making (see chart for details).  Patient presented to the emergency department today because of concerns for worsening right great and second toe pain. ddx worsening claudication, infection, osteomyelitis amongst other etiologies. Patient also had some complaint of a wire sticking out of groin incision site, but site still with original dressing and no wire appreciated. ABI average .91. X-ray without evidence of osseous involvement. At this point concern for infection. Will plan on prescribing antibiotic. Will also give patient further pain medication ([facility] drug database was queried prior to prescription). Patient already has follow up with vascular scheduled in 3 days. Discussed plan with patient and family. _______________________________________   FINAL CLINICAL IMPRESSION(S) / ED DIAGNOSES  Final diagnoses:  Toe pain, right     Note: This dictation was prepared  with Dragon dictation. Any transcriptional errors that result from this process are unintentional     Nance Pear, MD 08/13/17 [phone]

## 2017-08-13 NOTE — ED Notes (Addendum)
Right Ankle Blood Pressure.   1) 154/54 (84) 2) 165/55 (86)

## 2017-08-13 NOTE — ED Triage Notes (Signed)
Pt had angiography with dr dew last Monday.  Has had worsening pain from foot up leg on right.  Black necrotic like area to right great toe that pt feels has increased since procedure.  Foot dusky that pt also feels has increased a little since procedure.  Could not wait until FU with dr dew Tuesday r/ t pain. Faint doppler DP present on right.

## 2017-08-15 ENCOUNTER — Encounter (INDEPENDENT_AMBULATORY_CARE_PROVIDER_SITE_OTHER): Payer: BLUE CROSS/BLUE SHIELD

## 2017-08-15 ENCOUNTER — Ambulatory Visit (INDEPENDENT_AMBULATORY_CARE_PROVIDER_SITE_OTHER): Payer: BLUE CROSS/BLUE SHIELD | Admitting: Vascular Surgery

## 2017-08-15 ENCOUNTER — Other Ambulatory Visit (INDEPENDENT_AMBULATORY_CARE_PROVIDER_SITE_OTHER): Payer: Self-pay | Admitting: Vascular Surgery

## 2017-08-15 DIAGNOSIS — I739 Peripheral vascular disease, unspecified: Secondary | ICD-10-CM

## 2017-08-16 ENCOUNTER — Ambulatory Visit (INDEPENDENT_AMBULATORY_CARE_PROVIDER_SITE_OTHER): Payer: BLUE CROSS/BLUE SHIELD

## 2017-08-16 ENCOUNTER — Other Ambulatory Visit (INDEPENDENT_AMBULATORY_CARE_PROVIDER_SITE_OTHER): Payer: Self-pay | Admitting: Vascular Surgery

## 2017-08-16 ENCOUNTER — Encounter (INDEPENDENT_AMBULATORY_CARE_PROVIDER_SITE_OTHER): Payer: Self-pay

## 2017-08-16 ENCOUNTER — Ambulatory Visit (INDEPENDENT_AMBULATORY_CARE_PROVIDER_SITE_OTHER): Payer: BLUE CROSS/BLUE SHIELD | Admitting: Vascular Surgery

## 2017-08-16 ENCOUNTER — Encounter (INDEPENDENT_AMBULATORY_CARE_PROVIDER_SITE_OTHER): Payer: Self-pay | Admitting: Vascular Surgery

## 2017-08-16 VITALS — BP 139/87 | HR 66 | Resp 17 | Ht 71.0 in | Wt 183.0 lb

## 2017-08-16 DIAGNOSIS — I70221 Atherosclerosis of native arteries of extremities with rest pain, right leg: Secondary | ICD-10-CM | POA: Diagnosis not present

## 2017-08-16 DIAGNOSIS — E1129 Type 2 diabetes mellitus with other diabetic kidney complication: Secondary | ICD-10-CM | POA: Diagnosis not present

## 2017-08-16 DIAGNOSIS — I739 Peripheral vascular disease, unspecified: Secondary | ICD-10-CM

## 2017-08-16 DIAGNOSIS — Z794 Long term (current) use of insulin: Secondary | ICD-10-CM

## 2017-08-16 DIAGNOSIS — E7800 Pure hypercholesterolemia, unspecified: Secondary | ICD-10-CM | POA: Diagnosis not present

## 2017-08-16 MED ORDER — OXYCODONE-ACETAMINOPHEN 5-325 MG PO TABS: ORAL_TABLET | ORAL | 0 refills | Status: DC

## 2017-08-16 NOTE — Progress Notes (Signed)
Subjective:    Patient ID: [name], male    DOB: 29-Dec-1960, 56 y.o.   MRN: [id number] Chief Complaint  Patient presents with  . Follow-up    Right Foot pain   Patient presents with a chief complaint of worsening right foot pain. On 08/08/2017 the patient underwent Ultrasound guidance for vascular access left femoral artery, Catheter placement into right posterior tibial artery from left femoral approach, Aortogram and selective right lower extremity angiogram, Percutaneous transluminal angioplasty of right proximal to mid superficial femoral artery with 6 mm diameter by 8 cm length Lutonix drug-coated angioplasty balloon, Percutaneous transluminal angioplasty of the right distal posterior tibial artery with 2 mm diameter by 15 cm length angioplasty balloon with starClose closure device femoral artery. Patient presents today with worsening right foot pain.He was seen in the [facility] ED on 08/13/2017 for this pain. He denies any wound development to the right foot. The patient underwent a bilateral ABI which was notable for non-compressible vessels due to medial calcification. The right ankle-brachial index suggests critical right lower extremity ischemia.The left ankle-brachial index adjust critical left lower extremity ischemia. Patient denies any fever, nausea or vomiting.   Review of Systems  Constitutional: Negative.   HENT: Negative.   Eyes: Negative.   Respiratory: Negative.   Cardiovascular:       Right foot.  Gastrointestinal: Negative.   Endocrine: Negative.   Genitourinary: Negative.   Musculoskeletal: Negative.   Skin: Negative.   Allergic/Immunologic: Negative.   Neurological: Negative.   Hematological: Negative.   Psychiatric/Behavioral: Negative.       Objective:   Physical Exam  Constitutional: He is oriented to person, place, and time. He appears well-developed and well-nourished. No distress.  HENT:  Head: Normocephalic and  atraumatic.  Eyes: Pupils are equal, round, and reactive to light. Conjunctivae are normal.  Neck: Normal range of motion.  Cardiovascular: Normal rate, regular rhythm, normal heart sounds and intact distal pulses.   Pulses:      Radial pulses are 2+ on the right side, and 2+ on the left side.  Unable to palpate pedal pulses. The right foot is cooler when compared to left.  Pulmonary/Chest: Effort normal.  Musculoskeletal: Normal range of motion. He exhibits no edema.  Neurological: He is alert and oriented to person, place, and time.  Skin: He is not diaphoretic.  Right foot: Skin is intact. There is no ulceration noted. Toes have reddish hue.   Psychiatric: He has a normal mood and affect. His behavior is normal. Judgment and thought content normal.  Vitals reviewed.  BP 139/87 (BP Location: Right Arm)   Pulse 66   Resp 17   Ht 5' 11"  (1.803 m)   Wt 183 lb (83 kg)   BMI 25.52 kg/m   Past Medical History:  Diagnosis Date  . Anemia   . Crohn disease ([place])   . Diabetes mellitus without complication ([place])   . DVT of lower extremity (deep venous thrombosis) ([place]) 2016  . Hidradenitis suppurativa   . Hypertension   . ICH (intracerebral hemorrhage) ([place])   . Renal disorder    Social History   Social History  . Marital status: Married    Spouse name: N/A  . Number of children: N/A  . Years of education: N/A   Occupational History  . Not on file.   Social History Main Topics  . Smoking status: Never Smoker  . Smokeless tobacco: Never Used  . Alcohol use No  .  Drug use: Yes    Types: Marijuana  . Sexual activity: Not on file   Other Topics Concern  . Not on file   Social History Narrative  . No narrative on file   Past Surgical History:  Procedure Laterality Date  . ABDOMINAL SURGERY    . KNEE SURGERY Left 02/04/2016   [facility]  . LOWER EXTREMITY ANGIOGRAPHY Right 08/08/2017   Procedure: Lower Extremity Angiography;  Surgeon: Algernon Huxley, MD;  Location: [facility];  Service: Cardiovascular;  Laterality: Right;  . LOWER EXTREMITY INTERVENTION  08/08/2017   Procedure: LOWER EXTREMITY INTERVENTION;  Surgeon: Algernon Huxley, MD;  Location: [facility];  Service: Cardiovascular;;   Family History  Problem Relation Age of Onset  . Irritable bowel syndrome Sister   . Diabetes Sister   . Heart disease Mother   . Diabetes Mother   . Heart disease Father   . Rheumatic fever Father        as child  . Psoriasis Brother   . Arthritis Brother   . Diabetes Sister   . Diabetes Sister    Allergies  Allergen Reactions  . Vancomycin Shortness Of Breath    Eyes watering, SOB, wheezing  . Methotrexate Other (See Comments)    Blood count drops  . Tape       Assessment & Plan:  Patient presents with a chief complaint of worsening right foot pain. On 08/08/2017 the patient underwent Ultrasound guidance for vascular access left femoral artery, Catheter placement into right posterior tibial artery from left femoral approach, Aortogram and selective right lower extremity angiogram, Percutaneous transluminal angioplasty of right proximal to mid superficial femoral artery with 6 mm diameter by 8 cm length Lutonix drug-coated angioplasty balloon, Percutaneous transluminal angioplasty of the right distal posterior tibial artery with 2 mm diameter by 15 cm length angioplasty balloon with starClose closure device femoral artery. Patient presents today with worsening right foot pain.He was seen in the [facility] ED on 08/13/2017 for this pain. He denies any wound development to the right foot. The patient underwent a bilateral ABI which was notable for non-compressible vessels due to medial calcification. The right ankle-brachial index suggests critical right lower extremity ischemia.The left ankle-brachial index adjust critical left lower extremity ischemia. Patient denies any fever, nausea or vomiting.  1. Atherosclerosis of native  artery of right lower extremity with rest pain ([place]) - Stable Patient with recent right lower extremity intervention however the patient presents today with worsening pain to the right foot. No pedal pulses present on exam. ABI with severe ischemia to the right lower extremity Recommend a right lower extremity angiogram with possible intervention in an attempt to assess anatomy and possibly revascularize the extremity to prevent tissue loss. Procedure, risks and benefits explained to the patient All questions answered Patient is willing to proceed Percocet 5/325 mg 1-2 tabs every 4-6 hours as needed for pain given to the patient due to his increasing pain level  2. Hypercholesteremia - Stable Encouraged good control as its slows the progression of atherosclerotic disease  3. Type 2 diabetes mellitus with other diabetic kidney complication, with long-term current use of insulin (H[facility]) - Stable Encouraged good control as its slows the progression of atherosclerotic disease  Current Outpatient Prescriptions on File Prior to Visit  Medication Sig Dispense Refill  . amLODipine (NORVASC) 10 MG tablet Take 1 tablet (10 mg total) by mouth daily as needed. 30 tablet 5  . aspirin EC  81 MG tablet Take 81 mg by mouth daily.    Marland Kitchen atorvastatin (LIPITOR) 80 MG tablet Take 1 tablet (80 mg total) by mouth daily. 90 tablet 3  . calcitRIOL (ROCALTROL) 0.25 MCG capsule Take 0.5 mcg by mouth daily.   1  . carvedilol (COREG) 25 MG tablet Take 12.5 mg by mouth 2 (two) times daily.   3  . cephALEXin (KEFLEX) 500 MG capsule Take 1 capsule (500 mg total) by mouth 4 (four) times daily. 40 capsule 0  . clopidogrel (PLAVIX) 75 MG tablet Take 1 tablet (75 mg total) by mouth daily. 30 tablet 11  . furosemide (LASIX) 80 MG tablet Take 1 tablet (80 mg total) by mouth 2 (two) times daily. 60 tablet 4  . glucose blood (BAYER CONTOUR TEST) test strip Check sugar three times daily for insulin dependent diabetes 100 each 12  .  Hydrocodone-Acetaminophen (VICODIN) 5-300 MG TABS Take 1 tablet by mouth every 8 (eight) hours as needed. 30 each 0  . inFLIXimab (REMICADE) 100 MG injection Inject into the vein every 6 (six) weeks.     . insulin aspart (NOVOLOG FLEXPEN) 100 UNIT/ML FlexPen Take 8-15 units three times a day before each meal 1 pen 3  . insulin glargine (LANTUS) 100 UNIT/ML injection Inject 0.4 mLs (40 Units total) into the skin daily at 10 pm. (Patient taking differently: Inject 7 Units into the skin at bedtime. ) 10 mL 11  . levETIRAcetam (KEPPRA) 1000 MG tablet Take 1,000 mg by mouth at bedtime.   0  . lisinopril (PRINIVIL,ZESTRIL) 20 MG tablet Take 1 tablet (20 mg total) by mouth daily. 90 tablet 3  . nystatin (MYCOSTATIN) 100000 UNIT/ML suspension Take 5 mLs (500,000 Units total) by mouth 4 (four) times daily. 60 mL 2  . oxyCODONE (OXY IR/ROXICODONE) 5 MG immediate release tablet Take 1 tablet by mouth as needed.  0  . polyethylene glycol (MIRALAX / GLYCOLAX) packet Take 1 packet by mouth as needed.  0  . sildenafil (VIAGRA) 100 MG tablet Take 1 tablet (100 mg total) by mouth daily as needed. 6 tablet 1  . sodium bicarbonate 650 MG tablet Take 650 mg by mouth 2 (two) times daily.    Marland Kitchen triamcinolone ointment (KENALOG) 0.1 % Apply 1 application topically as needed.   0   No current facility-administered medications on file prior to visit.    There are no Patient Instructions on file for this visit. No Follow-up on file.  KIMBERLY A STEGMAYER, PA-C

## 2017-08-17 MED ORDER — CEFAZOLIN SODIUM-DEXTROSE 1-4 GM/50ML-% IV SOLN
1.0000 g | Freq: Once | INTRAVENOUS | Status: AC
  Administered 2017-08-22: 1 g via INTRAVENOUS

## 2017-08-18 HISTORY — PX: BELOW KNEE LEG AMPUTATION: SUR23

## 2017-08-22 ENCOUNTER — Encounter
Admission: RE | Disposition: A | Discharge status: Home / Self Care | Payer: Self-pay | Source: Ambulatory Visit | Attending: Vascular Surgery

## 2017-08-22 ENCOUNTER — Encounter: Payer: Self-pay | Admitting: *Deleted

## 2017-08-22 ENCOUNTER — Ambulatory Visit
Admission: RE | Admit: 2017-08-22 | Discharge: 2017-08-22 | Disposition: A | Discharge status: Home / Self Care | Payer: BLUE CROSS/BLUE SHIELD | Source: Ambulatory Visit | Attending: Vascular Surgery | Admitting: Vascular Surgery

## 2017-08-22 DIAGNOSIS — I70228 Atherosclerosis of native arteries of extremities with rest pain, other extremity: Secondary | ICD-10-CM | POA: Diagnosis present

## 2017-08-22 DIAGNOSIS — Z8379 Family history of other diseases of the digestive system: Secondary | ICD-10-CM | POA: Insufficient documentation

## 2017-08-22 DIAGNOSIS — Z87820 Personal history of traumatic brain injury: Secondary | ICD-10-CM | POA: Insufficient documentation

## 2017-08-22 DIAGNOSIS — Z7902 Long term (current) use of antithrombotics/antiplatelets: Secondary | ICD-10-CM | POA: Diagnosis not present

## 2017-08-22 DIAGNOSIS — I10 Essential (primary) hypertension: Secondary | ICD-10-CM | POA: Diagnosis not present

## 2017-08-22 DIAGNOSIS — Z794 Long term (current) use of insulin: Secondary | ICD-10-CM | POA: Insufficient documentation

## 2017-08-22 DIAGNOSIS — E1129 Type 2 diabetes mellitus with other diabetic kidney complication: Secondary | ICD-10-CM | POA: Insufficient documentation

## 2017-08-22 DIAGNOSIS — K5090 Crohn's disease, unspecified, without complications: Secondary | ICD-10-CM | POA: Insufficient documentation

## 2017-08-22 DIAGNOSIS — Z79899 Other long term (current) drug therapy: Secondary | ICD-10-CM | POA: Diagnosis not present

## 2017-08-22 DIAGNOSIS — Z888 Allergy status to other drugs, medicaments and biological substances status: Secondary | ICD-10-CM | POA: Diagnosis not present

## 2017-08-22 DIAGNOSIS — Z833 Family history of diabetes mellitus: Secondary | ICD-10-CM | POA: Insufficient documentation

## 2017-08-22 DIAGNOSIS — Z881 Allergy status to other antibiotic agents status: Secondary | ICD-10-CM | POA: Diagnosis not present

## 2017-08-22 DIAGNOSIS — L732 Hidradenitis suppurativa: Secondary | ICD-10-CM | POA: Diagnosis not present

## 2017-08-22 DIAGNOSIS — Z955 Presence of coronary angioplasty implant and graft: Secondary | ICD-10-CM | POA: Diagnosis not present

## 2017-08-22 DIAGNOSIS — Z8261 Family history of arthritis: Secondary | ICD-10-CM | POA: Diagnosis not present

## 2017-08-22 DIAGNOSIS — Z9109 Other allergy status, other than to drugs and biological substances: Secondary | ICD-10-CM | POA: Insufficient documentation

## 2017-08-22 DIAGNOSIS — E7800 Pure hypercholesterolemia, unspecified: Secondary | ICD-10-CM | POA: Diagnosis not present

## 2017-08-22 DIAGNOSIS — I70221 Atherosclerosis of native arteries of extremities with rest pain, right leg: Secondary | ICD-10-CM | POA: Diagnosis not present

## 2017-08-22 DIAGNOSIS — Z7982 Long term (current) use of aspirin: Secondary | ICD-10-CM | POA: Diagnosis not present

## 2017-08-22 DIAGNOSIS — Z86718 Personal history of other venous thrombosis and embolism: Secondary | ICD-10-CM | POA: Diagnosis not present

## 2017-08-22 DIAGNOSIS — Z8249 Family history of ischemic heart disease and other diseases of the circulatory system: Secondary | ICD-10-CM | POA: Insufficient documentation

## 2017-08-22 HISTORY — PX: LOWER EXTREMITY ANGIOGRAPHY: CATH118251

## 2017-08-22 HISTORY — PX: LOWER EXTREMITY INTERVENTION: CATH118252

## 2017-08-22 LAB — POTASSIUM (ARMC VASCULAR LAB ONLY): Potassium (ARMC vascular lab): 3.3 — ABNORMAL LOW (ref 3.5–5.1)

## 2017-08-22 LAB — GLUCOSE, CAPILLARY
Glucose-Capillary: 117 mg/dL — ABNORMAL HIGH (ref 65–99)
Glucose-Capillary: 126 mg/dL — ABNORMAL HIGH (ref 65–99)

## 2017-08-22 SURGERY — LOWER EXTREMITY ANGIOGRAPHY
Anesthesia: Moderate Sedation | Laterality: Right

## 2017-08-22 MED ORDER — HEPARIN SODIUM (PORCINE) 1000 UNIT/ML IJ SOLN
INTRAMUSCULAR | Status: AC
  Filled 2017-08-22: qty 1

## 2017-08-22 MED ORDER — HYDRALAZINE HCL 20 MG/ML IJ SOLN: 5.0000 mg | INTRAMUSCULAR | Status: DC | PRN

## 2017-08-22 MED ORDER — ONDANSETRON HCL 4 MG/2ML IJ SOLN: 4.0000 mg | Freq: Four times a day (QID) | INTRAMUSCULAR | Status: DC | PRN

## 2017-08-22 MED ORDER — IOPAMIDOL (ISOVUE-300) INJECTION 61%
INTRAVENOUS | Status: DC | PRN
Start: 2017-08-22 — End: 2017-08-22
  Administered 2017-08-22: 40 mL via INTRAVENOUS

## 2017-08-22 MED ORDER — HEPARIN (PORCINE) IN NACL 2-0.9 UNIT/ML-% IJ SOLN
INTRAMUSCULAR | Status: AC
  Filled 2017-08-22: qty 1000

## 2017-08-22 MED ORDER — SODIUM CHLORIDE 0.9 % IV SOLN: INTRAVENOUS | Status: DC

## 2017-08-22 MED ORDER — HEPARIN SODIUM (PORCINE) 1000 UNIT/ML IJ SOLN
INTRAMUSCULAR | Status: DC | PRN
  Administered 2017-08-22: 3000 [IU] via INTRAVENOUS

## 2017-08-22 MED ORDER — FAMOTIDINE 20 MG PO TABS: 40.0000 mg | ORAL_TABLET | ORAL | Status: DC | PRN

## 2017-08-22 MED ORDER — SODIUM CHLORIDE 0.9 % IV SOLN: 250.0000 mL | INTRAVENOUS | Status: DC | PRN

## 2017-08-22 MED ORDER — SODIUM CHLORIDE 0.9% FLUSH: 3.0000 mL | INTRAVENOUS | Status: DC | PRN

## 2017-08-22 MED ORDER — SODIUM CHLORIDE 0.9% FLUSH: 3.0000 mL | Freq: Two times a day (BID) | INTRAVENOUS | Status: DC

## 2017-08-22 MED ORDER — LABETALOL HCL 5 MG/ML IV SOLN: 10.0000 mg | INTRAVENOUS | Status: DC | PRN

## 2017-08-22 MED ORDER — METHYLPREDNISOLONE SODIUM SUCC 125 MG IJ SOLR: 125.0000 mg | INTRAMUSCULAR | Status: DC | PRN

## 2017-08-22 MED ORDER — MIDAZOLAM HCL 2 MG/2ML IJ SOLN
INTRAMUSCULAR | Status: DC | PRN
Start: 2017-08-22 — End: 2017-08-22
  Administered 2017-08-22: 2 mg via INTRAVENOUS

## 2017-08-22 MED ORDER — HYDROMORPHONE HCL 1 MG/ML IJ SOLN
1.0000 mg | Freq: Once | INTRAMUSCULAR | Status: AC | PRN
  Administered 2017-08-22: 0.5 mg via INTRAVENOUS

## 2017-08-22 MED ORDER — FENTANYL CITRATE (PF) 100 MCG/2ML IJ SOLN
INTRAMUSCULAR | Status: DC | PRN
  Administered 2017-08-22 (×2): 50 ug via INTRAVENOUS

## 2017-08-22 MED ORDER — HYDROMORPHONE HCL 1 MG/ML IJ SOLN
INTRAMUSCULAR | Status: AC
  Filled 2017-08-22: qty 0.5

## 2017-08-22 MED ORDER — FENTANYL CITRATE (PF) 100 MCG/2ML IJ SOLN
INTRAMUSCULAR | Status: AC
  Filled 2017-08-22: qty 2

## 2017-08-22 MED ORDER — MIDAZOLAM HCL 2 MG/2ML IJ SOLN
INTRAMUSCULAR | Status: AC
  Filled 2017-08-22: qty 4

## 2017-08-22 MED ORDER — LIDOCAINE-EPINEPHRINE (PF) 1 %-1:200000 IJ SOLN
INTRAMUSCULAR | Status: AC
  Filled 2017-08-22: qty 30

## 2017-08-22 MED ORDER — SODIUM CHLORIDE 0.9 % IV SOLN
INTRAVENOUS | Status: DC
  Administered 2017-08-22: 12:00:00 via INTRAVENOUS

## 2017-08-22 SURGICAL SUPPLY — 12 items
BALLN ULTRVRSE 3.5X40X150 (BALLOONS) ×3
BALLOON ULTRVRSE 3.5X40X150 (BALLOONS) ×2 IMPLANT
CATH BEACON 5 .035 65 RIM TIP (CATHETERS) ×3 IMPLANT
CATH BEACON 5 .038 100 VERT TP (CATHETERS) ×3 IMPLANT
DEVICE PRESTO INFLATION (MISCELLANEOUS) ×3 IMPLANT
DEVICE STARCLOSE SE CLOSURE (Vascular Products) ×3 IMPLANT
PACK ANGIOGRAPHY (CUSTOM PROCEDURE TRAY) ×3 IMPLANT
SHEATH ANL2 6FRX45 HC (SHEATH) ×3 IMPLANT
SHEATH BRITE TIP 5FRX11 (SHEATH) ×3 IMPLANT
WIRE G V18X300CM (WIRE) ×3 IMPLANT
WIRE J 3MM .035X145CM (WIRE) ×3 IMPLANT
WIRE MAGIC TORQUE 260C (WIRE) ×3 IMPLANT

## 2017-08-22 NOTE — Progress Notes (Signed)
Dr. Lucky Cowboy at bedside speaking with pt. & spouse re: procedure. Both verbalize understanding.

## 2017-08-22 NOTE — Op Note (Signed)
[facility] Percutaneous Study/Intervention Procedural Note   Date of Surgery: 08/22/2017  Surgeon(s):DEW,JASON   Assistants:none  Pre-operative Diagnosis: PAD with rest Brady and early gangrenous changes on the right foot  Post-operative diagnosis: Same  Procedure(s) Performed: 1. Ultrasound guidance for vascular access left femoral artery 2. Catheter placement into right anterior tibial artery from left femoral approach 3. Selective right lower extremity angiogram 4. Percutaneous transluminal angioplasty of the proximal right anterior tibial artery a 3.5 mm diameter by 4 cm length angioplasty balloon 5. StarClose closure device left femoral artery  EBL: 5 cc  Contrast: 40 cc  Fluoro Time: 2.7 minutes  Moderate Conscious Sedation Time: approximately 20 minutes using 2 mg of Versed and 100 Mcg of Fentanyl  Indications: Patient is a 56 y.o.male with continued rest Brady and no improvement in the gangrenous changes on his toes after previous intervention last month. The patient has noninvasive study showing flat digital tracings. The patient is brought in for angiography for further evaluation and potential treatment. Risks and benefits are discussed and informed consent is obtained  Procedure: The patient was identified and appropriate procedural time out was performed. The patient was then placed supine on the table and prepped and draped in the usual sterile fashion.Moderate conscious sedation was administered during a face to face encounter with the patient throughout the procedure with my supervision of the RN administering medicines and monitoring the patient's vital signs, pulse oximetry, telemetry and mental status throughout from the start of the procedure until the patient was taken to the recovery room. Ultrasound was used to evaluate the left common femoral artery. It  was patent . A digital ultrasound image was acquired. A Seldinger needle was used to access the left common femoral artery under direct ultrasound guidance and a permanent image was performed. A 0.035 J wire was advanced without resistance and a 5Fr sheath was placed. Aortogram was not performed due to a recent normal aortogram.  Using a rim catheter, I then crossed the aortic bifurcation and advanced to the right femoral head. Selective right lower extremity angiogram was then performed. This demonstrated the common femoral artery was widely patent.  The previous SFA intervention was patent and there was no significant recurrent stenosis or other stenosis in the SFA or popliteal arteries.  The proximal anterior tibial artery did have about a 70% stenosis that was not seen until we went into an LAO projection but was present.  The tibioperoneal trunk, proximal peroneal arteries, and posterior tibial arteries did not have significant stenosis.  There were no other focal stenosis in the tibial vessels with all 3 tibial vessels essentially terminating around the level of the ankle or proximal foot with almost no intrinsic flow seen in the foot at this point. The patient was systemically heparinized and a 6 [place] Ansell sheath was then placed over the Magic torque wire. I then used a Kumpe catheter and the V 18 wire to cross the anterior tibial artery stenosis and confirm intraluminal flow distally.  The catheter was then removed.  A 3.5 mm diameter by 4 cm length angioplasty balloon was then used to treat the proximal anterior tibial artery.  A significant waist was seen with angioplasty which resolved at burst inflation pressure.  This was held for 1 minute.  Completion angiogram following this showed no significant residual stenosis in the proximal anterior tibial artery. I elected to terminate the procedure. The sheath was removed and StarClose closure device was deployed in the left  femoral artery with  excellent hemostatic result. The patient was taken to the recovery room in stable condition having tolerated the procedure well.  Findings:   Right lower Extremity:The common femoral artery was widely patent.  The previous SFA intervention was patent and there was no significant recurrent stenosis or other stenosis in the SFA or popliteal arteries.  The proximal anterior tibial artery did have about a 70% stenosis that was not seen until we went into an LAO projection but was present.  The tibioperoneal trunk, proximal peroneal arteries, and posterior tibial arteries did not have significant stenosis.  There were no other focal stenosis in the tibial vessels with all 3 tibial vessels essentially terminating around the level of the ankle or proximal foot with almost no intrinsic flow seen in the foot at this point.   Disposition: Patient was taken to the recovery room in stable condition having tolerated the procedure well.  Complications: None  [name] 08/22/2017 12:42 PM   This note was created with Dragon Medical transcription system. Any errors in dictation are purely unintentional.

## 2017-08-22 NOTE — H&P (Signed)
[facility] History & Physical Update  The patient was interviewed and re-examined.  The patient's previous History and Physical has been reviewed and is unchanged.  There is no change in the plan of care. We plan to proceed with the scheduled procedure.  Leotis Pain, MD  08/22/2017, 11:38 AM

## 2017-08-23 ENCOUNTER — Encounter: Payer: Self-pay | Admitting: Vascular Surgery

## 2017-09-14 ENCOUNTER — Other Ambulatory Visit (INDEPENDENT_AMBULATORY_CARE_PROVIDER_SITE_OTHER): Payer: Self-pay | Admitting: Vascular Surgery

## 2017-09-14 DIAGNOSIS — I739 Peripheral vascular disease, unspecified: Secondary | ICD-10-CM

## 2017-09-15 MED ORDER — AMLODIPINE BESYLATE 10 MG PO TABS
10.00 mg | ORAL_TABLET | ORAL | Status: DC
Start: 2017-09-16 — End: 2017-09-15

## 2017-09-15 MED ORDER — MORPHINE SULFATE (PF) 4 MG/ML IV SOLN
4.00 mg | INTRAVENOUS | Status: DC
Start: ? — End: 2017-09-15

## 2017-09-15 MED ORDER — SENNOSIDES 8.6 MG PO TABS
2.00 | ORAL_TABLET | ORAL | Status: DC
Start: 2017-09-15 — End: 2017-09-15

## 2017-09-15 MED ORDER — SEVELAMER CARBONATE 800 MG PO TABS
800.00 mg | ORAL_TABLET | ORAL | Status: DC
Start: 2017-09-15 — End: 2017-09-15

## 2017-09-15 MED ORDER — ALBUMIN HUMAN 25 % IV SOLN
25.00 | INTRAVENOUS | Status: DC
Start: ? — End: 2017-09-15

## 2017-09-15 MED ORDER — ATORVASTATIN CALCIUM 80 MG PO TABS
80.00 mg | ORAL_TABLET | ORAL | Status: DC
Start: 2017-09-15 — End: 2017-09-15

## 2017-09-15 MED ORDER — EPOETIN ALFA 2000 UNIT/ML IJ SOLN
8000.00 | INTRAMUSCULAR | Status: DC
Start: 2017-09-16 — End: 2017-09-15

## 2017-09-15 MED ORDER — INSULIN LISPRO 100 UNIT/ML ~~LOC~~ SOLN
.00 | SUBCUTANEOUS | Status: DC
Start: 2017-09-15 — End: 2017-09-15

## 2017-09-15 MED ORDER — GENERIC EXTERNAL MEDICATION
2.00 | Status: DC
Start: ? — End: 2017-09-15

## 2017-09-15 MED ORDER — LEVETIRACETAM 500 MG PO TABS
1000.00 mg | ORAL_TABLET | ORAL | Status: DC
Start: 2017-09-15 — End: 2017-09-15

## 2017-09-15 MED ORDER — POLYETHYLENE GLYCOL 3350 17 G PO PACK
17.00 | PACK | ORAL | Status: DC
Start: 2017-09-16 — End: 2017-09-15

## 2017-09-15 MED ORDER — ACETAMINOPHEN 325 MG PO TABS
650.00 mg | ORAL_TABLET | ORAL | Status: DC
Start: ? — End: 2017-09-15

## 2017-09-15 MED ORDER — SENNOSIDES 8.6 MG PO TABS
2.00 | ORAL_TABLET | ORAL | Status: DC
Start: ? — End: 2017-09-15

## 2017-09-15 MED ORDER — SODIUM CHLORIDE 0.9 % IV SOLN
INTRAVENOUS | Status: DC
Start: ? — End: 2017-09-15

## 2017-09-15 MED ORDER — CARVEDILOL 25 MG PO TABS
25.00 mg | ORAL_TABLET | ORAL | Status: DC
Start: 2017-09-15 — End: 2017-09-15

## 2017-09-15 MED ORDER — GENERIC EXTERNAL MEDICATION
Status: DC
Start: ? — End: 2017-09-15

## 2017-09-15 MED ORDER — SODIUM BICARBONATE 650 MG PO TABS
650.00 mg | ORAL_TABLET | ORAL | Status: DC
Start: 2017-09-15 — End: 2017-09-15

## 2017-09-15 MED ORDER — DEXTROSE 50 % IV SOLN
12.50 | INTRAVENOUS | Status: DC
Start: ? — End: 2017-09-15

## 2017-09-15 MED ORDER — HEPARIN SODIUM (PORCINE) 1000 UNIT/ML IJ SOLN
2.20 | INTRAMUSCULAR | Status: DC
Start: ? — End: 2017-09-15

## 2017-09-15 MED ORDER — GENERIC EXTERNAL MEDICATION
2.20 | Status: DC
Start: ? — End: 2017-09-15

## 2017-09-15 MED ORDER — ACETAMINOPHEN 500 MG PO TABS
1000.00 mg | ORAL_TABLET | ORAL | Status: DC
Start: 2017-09-15 — End: 2017-09-15

## 2017-09-15 MED ORDER — CALCITRIOL 0.5 MCG PO CAPS
0.50 | ORAL_CAPSULE | ORAL | Status: DC
Start: 2017-09-16 — End: 2017-09-15

## 2017-09-15 MED ORDER — ONDANSETRON HCL 8 MG PO TABS
8.00 mg | ORAL_TABLET | ORAL | Status: DC
Start: ? — End: 2017-09-15

## 2017-09-15 MED ORDER — HEPARIN SODIUM (PORCINE) 1000 UNIT/ML IJ SOLN
2.00 | INTRAMUSCULAR | Status: DC
Start: ? — End: 2017-09-15

## 2017-09-16 ENCOUNTER — Encounter (INDEPENDENT_AMBULATORY_CARE_PROVIDER_SITE_OTHER): Payer: BLUE CROSS/BLUE SHIELD

## 2017-09-16 ENCOUNTER — Ambulatory Visit (INDEPENDENT_AMBULATORY_CARE_PROVIDER_SITE_OTHER): Payer: BLUE CROSS/BLUE SHIELD | Admitting: Vascular Surgery

## 2017-09-30 ENCOUNTER — Telehealth: Payer: Self-pay

## 2017-10-04 NOTE — Telephone Encounter (Signed)
error 

## 2017-10-06 ENCOUNTER — Encounter: Payer: BLUE CROSS/BLUE SHIELD | Attending: Nurse Practitioner | Admitting: Nurse Practitioner

## 2017-10-06 DIAGNOSIS — Z8673 Personal history of transient ischemic attack (TIA), and cerebral infarction without residual deficits: Secondary | ICD-10-CM | POA: Insufficient documentation

## 2017-10-06 DIAGNOSIS — Z794 Long term (current) use of insulin: Secondary | ICD-10-CM | POA: Diagnosis not present

## 2017-10-06 DIAGNOSIS — E11622 Type 2 diabetes mellitus with other skin ulcer: Secondary | ICD-10-CM | POA: Insufficient documentation

## 2017-10-06 DIAGNOSIS — Z89511 Acquired absence of right leg below knee: Secondary | ICD-10-CM | POA: Diagnosis not present

## 2017-10-06 DIAGNOSIS — K5090 Crohn's disease, unspecified, without complications: Secondary | ICD-10-CM | POA: Diagnosis not present

## 2017-10-06 DIAGNOSIS — E1122 Type 2 diabetes mellitus with diabetic chronic kidney disease: Secondary | ICD-10-CM | POA: Diagnosis not present

## 2017-10-06 DIAGNOSIS — Z993 Dependence on wheelchair: Secondary | ICD-10-CM | POA: Insufficient documentation

## 2017-10-06 DIAGNOSIS — N186 End stage renal disease: Secondary | ICD-10-CM | POA: Diagnosis not present

## 2017-10-06 DIAGNOSIS — I120 Hypertensive chronic kidney disease with stage 5 chronic kidney disease or end stage renal disease: Secondary | ICD-10-CM | POA: Insufficient documentation

## 2017-10-06 DIAGNOSIS — Z87891 Personal history of nicotine dependence: Secondary | ICD-10-CM | POA: Insufficient documentation

## 2017-10-06 DIAGNOSIS — L89153 Pressure ulcer of sacral region, stage 3: Secondary | ICD-10-CM | POA: Insufficient documentation

## 2017-10-08 NOTE — Progress Notes (Signed)
[name] ([id number]) Visit Report for 10/06/2017 Allergy List Details Patient Name: Brady, [name] Date of Service: 10/06/2017 8:00 AM Medical Record Number: [id number] Patient Account Number: [id number] Date of Birth/Sex: 04-Nov-1960 (56 y.o. Male) Treating RN: Montey Hora Primary Care Tashanna Dolin: [name] Other Clinician: Referring Windsor Goeken: Juluis Pitch Treating Juniper Snyders/Extender: Lawanda Cousins Weeks in Treatment: 0 Allergies Active Allergies adhesive cefepime methotrexate silicon vancomycin Allergy Notes Electronic Signature(s) Signed: 10/06/2017 4:38:02 PM By: Montey Hora Entered By: Montey Hora on 10/06/2017 09:16:30 [name], [name] ([id number]) -------------------------------------------------------------------------------- Arrival Information Details Patient Name: Brady, [name] Date of Service: 10/06/2017 8:00 AM Medical Record Number: [id number] Patient Account Number: [id number] Date of Birth/Sex: Jul 11, 1961 (56 y.o. Male) Treating RN: Montey Hora Primary Care Sena Clouatre: [name] Other Clinician: Referring Manna Gose: Juluis Pitch Treating Tadan Shill/Extender: Cathie Olden in Treatment: 0 Visit Information Patient Arrived: Wheel Chair Arrival Time: 08:26 Accompanied By: staff Transfer Assistance: Manual Patient Identification Verified: Yes Secondary Verification Process Completed: Yes Patient Has Alerts: Yes Patient Alerts: DMII Electronic Signature(s) Signed: 10/06/2017 4:38:02 PM By: Montey Hora Entered By: Montey Hora on 10/06/2017 08:27:57 [name], [name] ([id number]) -------------------------------------------------------------------------------- Clinic Level of Care Assessment Details Patient Name: Brady, [name] Date of Service: 10/06/2017 8:00 AM Medical Record Number: [id number] Patient Account Number: [id number] Date of Birth/Sex: [birth year] (56 y.o. Male) Treating RN: Montey Hora Primary  Care Danalee Flath: [name] Other Clinician: Referring Quinnie Barcelo: Juluis Pitch Treating Monick Rena/Extender: Cathie Olden in Treatment: 0 Clinic Level of Care Assessment Items TOOL 1 Quantity Score []  - Use when EandM and Procedure is performed on INITIAL visit 0 ASSESSMENTS - Nursing Assessment / Reassessment X - General Physical Exam (combine w/ comprehensive assessment (listed just below) when 1 20 performed on new pt. evals) X- 1 25 Comprehensive Assessment (HX, ROS, Risk Assessments, Wounds Hx, etc.) ASSESSMENTS - Wound and Skin Assessment / Reassessment []  - Dermatologic / Skin Assessment (not related to wound area) 0 ASSESSMENTS - Ostomy and/or Continence Assessment and Care []  - Incontinence Assessment and Management 0 []  - 0 Ostomy Care Assessment and Management (repouching, etc.) PROCESS - Coordination of Care X - Simple Patient / Family Education for ongoing care 1 15 []  - 0 Complex (extensive) Patient / Family Education for ongoing care X- 1 10 Staff obtains Programmer, systems, Records, Test Results / Process Orders []  - 0 Staff telephones HHA, Nursing Homes / Clarify orders / etc []  - 0 Routine Transfer to another Facility (non-emergent condition) []  - 0 Routine Hospital Admission (non-emergent condition) X- 1 15 New Admissions / Biomedical engineer / Ordering NPWT, Apligraf, etc. []  - 0 Emergency Hospital Admission (emergent condition) PROCESS - Special Needs []  - Pediatric / Minor Patient Management 0 []  - 0 Isolation Patient Management []  - 0 Hearing / Language / Visual special needs []  - 0 Assessment of Community assistance (transportation, D/C planning, etc.) []  - 0 Additional assistance / Altered mentation X- 1 15 Support Surface(s) Assessment (bed, cushion, seat, etc.) [name], [name] ([id number]) INTERVENTIONS - Miscellaneous []  - External ear exam 0 []  - 0 Patient Transfer (multiple staff / Civil Service fast streamer / Similar devices) []  - 0 Simple  Staple / Suture removal (25 or less) []  - 0 Complex Staple / Suture removal (26 or more) []  - 0 Hypo/Hyperglycemic Management (do not check if billed separately) []  - 0 Ankle / Brachial Index (ABI) - do not check if billed separately Has the patient been seen at the hospital within the last three years: Yes Total Score: 100 Level  Of Care: New/Established - Level 3 Electronic Signature(s) Signed: 10/06/2017 4:38:02 PM By: Montey Hora Entered By: Montey Hora on 10/06/2017 09:47:49 [name], [name] ([id number]) -------------------------------------------------------------------------------- Encounter Discharge Information Details Patient Name: Brady, [name] Date of Service: 10/06/2017 8:00 AM Medical Record Number: [id number] Patient Account Number: [id number] Date of Birth/Sex: [birth year] (56 y.o. Male) Treating RN: Montey Hora Primary Care Latron Ribas: [name] Other Clinician: Referring Tyah Acord: Juluis Pitch Treating Leroy Trim/Extender: Cathie Olden in Treatment: 0 Encounter Discharge Information Items Discharge Pain Level: 0 Discharge Condition: Stable Ambulatory Status: Wheelchair Discharge Destination: Nursing Home Transportation: Private Auto Accompanied By: staff Schedule Follow-up Appointment: Yes Medication Reconciliation completed and No provided to Patient/Care Mckinleigh Schuchart: Provided on Clinical Summary of Care: 10/06/2017 Form Type Recipient Paper Patient MM Electronic Signature(s) Signed: 10/06/2017 9:48:34 AM By: Montey Hora Entered By: Montey Hora on 10/06/2017 09:48:34 Brady, [name] ([id number]) -------------------------------------------------------------------------------- Multi Wound Chart Details Patient Name: Brady, [name] Date of Service: 10/06/2017 8:00 AM Medical Record Number: [id number] Patient Account Number: [id number] Date of Birth/Sex: 1961-09-12 (56 y.o. Male) Treating RN: Montey Hora Primary Care  Jermine Bibbee: [name] Other Clinician: Referring Gladyse Corvin: Lovie Macadamia, DAVID Treating Oneika Simonian/Extender: Cathie Olden in Treatment: 0 Vital Signs Height(in): 71 Pulse(bpm): 19 Weight(lbs): 178 Blood Pressure(mmHg): 134/75 Body Mass Index(BMI): 25 Temperature(F): 98.1 Respiratory Rate 18 (breaths/min): Photos: [N/A:N/A] Wound Location: Sacrum - Midline N/A N/A Wounding Event: Pressure Injury N/A N/A Primary Etiology: Pressure Ulcer N/A N/A Comorbid History: Hypertension, Peripheral N/A N/A Arterial Disease, Crohnos, Type II Diabetes Date Acquired: 09/05/2017 N/A N/A Weeks of Treatment: 0 N/A N/A Wound Status: Open N/A N/A Measurements L x W x D 3.4x0.8x0.1 N/A N/A (cm) Area (cm) : 2.136 N/A N/A Volume (cm) : 0.214 N/A N/A Classification: Category/Stage III N/A N/A Exudate Amount: Large N/A N/A Exudate Type: Purulent N/A N/A Exudate Color: yellow, brown, green N/A N/A Wound Margin: Flat and Intact N/A N/A Granulation Amount: Small (1-33%) N/A N/A Granulation Quality: Red N/A N/A Necrotic Amount: Large (67-100%) N/A N/A Exposed Structures: Fat Layer (Subcutaneous N/A N/A Tissue) Exposed: Yes Fascia: No Tendon: No Muscle: No Joint: No Bone: No Epithelialization: None N/A N/A Rentfrow, Burech B. ([id number]) Debridement: Debridement ([id number]) N/A N/A Pre-procedure 09:15 N/A N/A Verification/Time Out Taken: Pain Control: Lidocaine 4% Topical Solution N/A N/A Tissue Debrided: Fibrin/Slough, Subcutaneous N/A N/A Level: Skin/Subcutaneous Tissue N/A N/A Debridement Area (sq cm): 2.72 N/A N/A Instrument: Curette N/A N/A Bleeding: Minimum N/A N/A Hemostasis Achieved: Pressure N/A N/A Procedural Pain: 0 N/A N/A Post Procedural Pain: 0 N/A N/A Debridement Treatment Procedure was tolerated well N/A N/A Response: Post Debridement 3.4x0.8x0.2 N/A N/A Measurements L x W x D (cm) Post Debridement Volume: 0.427 N/A N/A (cm) Post Debridement Stage:  Category/Stage III N/A N/A Periwound Skin Texture: Excoriation: No N/A N/A Induration: No Callus: No Crepitus: No Rash: No Scarring: No Periwound Skin Moisture: Maceration: No N/A N/A Dry/Scaly: No Periwound Skin Color: Atrophie Blanche: No N/A N/A Cyanosis: No Ecchymosis: No Erythema: No Hemosiderin Staining: No Mottled: No Pallor: No Rubor: No Temperature: No Abnormality N/A N/A Tenderness on Palpation: Yes N/A N/A Wound Preparation: Ulcer Cleansing: N/A N/A Rinsed/Irrigated with Saline Topical Anesthetic Applied: Other: lidocaine 4% Procedures Performed: Debridement N/A N/A Treatment Notes Wound #1 (Midline Sacrum) 1. Cleansed with: Clean wound with Normal Saline 2. Anesthetic Topical Lidocaine 4% cream to wound bed prior to debridement 4. Dressing Applied: Prisma Ag 5. Secondary Dressing Applied Bordered Foam Dressing Dry Gauze Knope, Kaladin B. ([id number]) Electronic Signature(s) Signed: 10/06/2017 4:55:16 PM By:  Coulter, Leah Previous Signature: 10/06/2017 9:47:20 AM Version By: Montey Hora Entered By: Lawanda Cousins on 10/06/2017 10:21:51 Brady, [name] ([id number]) -------------------------------------------------------------------------------- [place] Details Patient Name: Brady, [name] Date of Service: 10/06/2017 8:00 AM Medical Record Number: [id number] Patient Account Number: [id number] Date of Birth/Sex: May 01, 1961 (56 y.o. Male) Treating RN: Montey Hora Primary Care Mohd. Derflinger: [name] Other Clinician: Referring Duff Pozzi: Juluis Pitch Treating Nioka Thorington/Extender: Cathie Olden in Treatment: 0 Active Inactive ` Abuse / Safety / Falls / Self Care Management Nursing Diagnoses: Impaired physical mobility Goals: Patient will remain injury free related to falls Date Initiated: 10/06/2017 Target Resolution Date: 12/24/2017 Goal Status: Active Interventions: Assess fall risk on admission and as  needed Notes: ` Orientation to the Wound Care Program Nursing Diagnoses: Knowledge deficit related to the wound healing center program Goals: Patient/caregiver will verbalize understanding of the [facility] Program Date Initiated: 10/06/2017 Target Resolution Date: 12/24/2017 Goal Status: Active Interventions: Provide education on orientation to the wound center Notes: ` Wound/Skin Impairment Nursing Diagnoses: Impaired tissue integrity Goals: Ulcer/skin breakdown will heal within 14 weeks Date Initiated: 10/06/2017 Target Resolution Date: 12/24/2017 Goal Status: Active Interventions: Erman, Avant B. ([id number]) Assess patient/caregiver ability to obtain necessary supplies Assess patient/caregiver ability to perform ulcer/skin care regimen upon admission and as needed Assess ulceration(s) every visit Notes: Electronic Signature(s) Signed: 10/06/2017 9:47:00 AM By: Montey Hora Entered By: Montey Hora on 10/06/2017 09:46:59 Reggio, Damel B. ([id number]) -------------------------------------------------------------------------------- Pain Assessment Details Patient Name: Brady, [name] Date of Service: 10/06/2017 8:00 AM Medical Record Number: [id number] Patient Account Number: [id number] Date of Birth/Sex: Oct 08, 1961 (56 y.o. Male) Treating RN: Montey Hora Primary Care Eldar Robitaille: [name] Other Clinician: Referring Britain Saber: Juluis Pitch Treating Trenton Passow/Extender: Cathie Olden in Treatment: 0 Active Problems Location of Pain Severity and Description of Pain Patient Has Paino Yes Site Locations Pain Location: Pain in Ulcers With Dressing Change: Yes Duration of the Pain. Constant / Intermittento Constant Pain Management and Medication Current Pain Management: Notes Topical or injectable lidocaine is offered to patient for acute pain when surgical debridement is performed. If needed, Patient is instructed to use over the counter  pain medication for the following 24-48 hours after debridement. Wound care MDs do not prescribed pain medications. Patient has chronic pain or uncontrolled pain. Patient has been instructed to make an appointment with their Primary Care Physician for pain management. Electronic Signature(s) Signed: 10/06/2017 4:38:02 PM By: Montey Hora Entered By: Montey Hora on 10/06/2017 08:29:07 Gearhart, [name] ([id number]) -------------------------------------------------------------------------------- Patient/Caregiver Education Details Patient Name: Kimberlin, Rhyse B. Date of Service: 10/06/2017 8:00 AM Medical Record Number: [id number] Patient Account Number: [id number] Date of Birth/Gender: [birth year] (56 y.o. Male) Treating RN: Montey Hora Primary Care Physician: [name] Other Clinician: Referring Physician: Juluis Pitch Treating Physician/Extender: Cathie Olden in Treatment: 0 Education Assessment Education Provided To: Caregiver SNF nurses via written orders Education Topics Provided Wound/Skin Impairment: Handouts: Other: wound care as ordered Methods: Printed Electronic Signature(s) Signed: 10/06/2017 4:38:02 PM By: Montey Hora Entered By: Montey Hora on 10/06/2017 09:49:11 Brady, [name] ([id number]) -------------------------------------------------------------------------------- Wound Assessment Details Patient Name: Brady, [name] Date of Service: 10/06/2017 8:00 AM Medical Record Number: [id number] Patient Account Number: [id number] Date of Birth/Sex: 26-Nov-1960 (56 y.o. Male) Treating RN: Montey Hora Primary Care Lawren Sexson: [name] Other Clinician: Referring Draco Malczewski: Juluis Pitch Treating Mc Bloodworth/Extender: Lawanda Cousins Weeks in Treatment: 0 Wound Status Wound Number: 1 Primary Pressure Ulcer Etiology: Wound Location: Sacrum - Midline Wound Open Wounding Event: Pressure Injury Status:  Date Acquired:  09/05/2017 Comorbid Hypertension, Peripheral Arterial Disease, Weeks Of Treatment: 0 History: Crohnos, Type II Diabetes Clustered Wound: No Photos Photo Uploaded By: Montey Hora on 10/06/2017 09:50:48 Wound Measurements Length: (cm) 3.4 Width: (cm) 0.8 Depth: (cm) 0.1 Area: (cm) 2.136 Volume: (cm) 0.214 % Reduction in Area: % Reduction in Volume: Epithelialization: None Tunneling: No Undermining: No Wound Description Classification: Category/Stage III Wound Margin: Flat and Intact Exudate Amount: Large Exudate Type: Purulent Exudate Color: yellow, brown, green Foul Odor After Cleansing: No Slough/Fibrino Yes Wound Bed Granulation Amount: Small (1-33%) Exposed Structure Granulation Quality: Red Fascia Exposed: No Necrotic Amount: Large (67-100%) Fat Layer (Subcutaneous Tissue) Exposed: Yes Necrotic Quality: Adherent Slough Tendon Exposed: No Muscle Exposed: No Joint Exposed: No Bone Exposed: No Periwound Skin Texture Harbaugh, [name] ([id number]) Texture Color No Abnormalities Noted: No No Abnormalities Noted: No Callus: No Atrophie Blanche: No Crepitus: No Cyanosis: No Excoriation: No Ecchymosis: No Induration: No Erythema: No Rash: No Hemosiderin Staining: No Scarring: No Mottled: No Pallor: No Moisture Rubor: No No Abnormalities Noted: No Dry / Scaly: No Temperature / Pain Maceration: No Temperature: No Abnormality Tenderness on Palpation: Yes Wound Preparation Ulcer Cleansing: Rinsed/Irrigated with Saline Topical Anesthetic Applied: Other: lidocaine 4%, Treatment Notes Wound #1 (Midline Sacrum) 1. Cleansed with: Clean wound with Normal Saline 2. Anesthetic Topical Lidocaine 4% cream to wound bed prior to debridement 4. Dressing Applied: Prisma Ag 5. Secondary Dressing Applied Bordered Foam Dressing Dry Gauze Electronic Signature(s) Signed: 10/06/2017 4:38:02 PM By: Montey Hora Entered By: Montey Hora on 10/06/2017  09:02:03 Welter, [name] ([id number]) -------------------------------------------------------------------------------- [place] Details Patient Name: Simone, Deepak B. Date of Service: 10/06/2017 8:00 AM Medical Record Number: [id number] Patient Account Number: [id number] Date of Birth/Sex: [birth year] (56 y.o. Male) Treating RN: Montey Hora Primary Care Lupita Rosales: [name] Other Clinician: Referring Jaydis Duchene: Lovie Macadamia, DAVID Treating Eliot Popper/Extender: Cathie Olden in Treatment: 0 Vital Signs Time Taken: 08:29 Temperature (F): 98.1 Height (in): 71 Pulse (bpm): 68 Source: Measured Respiratory Rate (breaths/min): 18 Weight (lbs): 178 Blood Pressure (mmHg): 134/75 Source: Measured Reference Range: 80 - 120 mg / dl Body Mass Index (BMI): 24.8 Electronic Signature(s) Signed: 10/06/2017 4:38:02 PM By: Montey Hora Entered By: Montey Hora on 10/06/2017 08:29:38

## 2017-10-08 NOTE — Progress Notes (Signed)
Katzman, IZEKIEL FLEGEL ([id number]) Visit Report for 10/06/2017 Abuse/Suicide Risk Screen Details Patient Name: [name], [name] Date of Service: 10/06/2017 8:00 AM Medical Record Number: [id number] Patient Account Number: [id number] Date of Birth/Sex: 03/15/1961 (56 y.o. Male) Treating RN: Montey Hora Primary Care Azarian Starace: [name] Other Clinician: Referring Nelva Hauk: Lovie Macadamia, DAVID Treating Nyxon Strupp/Extender: Cathie Olden in Treatment: 0 Abuse/Suicide Risk Screen Items Answer ABUSE/SUICIDE RISK SCREEN: Has anyone close to you tried to hurt or harm you recentlyo No Do you feel uncomfortable with anyone in your familyo No Has anyone forced you do things that you didnot want to doo No Do you have any thoughts of harming yourselfo No Patient displays signs or symptoms of abuse and/or neglect. No Electronic Signature(s) Signed: 10/06/2017 4:38:02 PM By: Montey Hora Entered By: Montey Hora on 10/06/2017 08:31:04 Klonowski, Toribio Harbour ([id number]) -------------------------------------------------------------------------------- Activities of Daily Living Details Patient Name: [name], [name] Date of Service: 10/06/2017 8:00 AM Medical Record Number: [id number] Patient Account Number: [id number] Date of Birth/Sex: 23-Feb-1961 (56 y.o. Male) Treating RN: Montey Hora Primary Care Jaysie Benthall: [name] Other Clinician: Referring Elizette Shek: Juluis Pitch Treating Kaisy Severino/Extender: Cathie Olden in Treatment: 0 Activities of Daily Living Items Answer Activities of Daily Living (Please select one for each item) Drive Automobile Not Able Take Medications Need Assistance Use Telephone Completely Able Care for Appearance Completely Able Use Toilet Need Assistance Bath / Shower Need Assistance Dress Self Need Assistance Feed Self Completely Able Walk Need Assistance Get In / Out Bed Need Assistance Housework Need Assistance Prepare Meals Need  Assistance Handle Money Completely Able Shop for Self Need Assistance Electronic Signature(s) Signed: 10/06/2017 4:38:02 PM By: Montey Hora Entered By: Montey Hora on 10/06/2017 08:31:48 Millea, Toribio Harbour ([id number]) -------------------------------------------------------------------------------- Education Assessment Details Patient Name: [name], [name] Date of Service: 10/06/2017 8:00 AM Medical Record Number: [id number] Patient Account Number: [id number] Date of Birth/Sex: 11-May-1961 (56 y.o. Male) Treating RN: Montey Hora Primary Care Sharlot Sturkey: [name] Other Clinician: Referring Mayanna Garlitz: Juluis Pitch Treating Suheily Birks/Extender: Cathie Olden in Treatment: 0 Primary Learner Assessed: Caregiver SNF nurses Learning Preferences/Education Level/Primary Language Learning Preference: Printed Material Highest Education Level: College or Above Preferred Language: English Cognitive Barrier Assessment/Beliefs Language Barrier: No Translator Needed: No Memory Deficit: No Emotional Barrier: No Cultural/Religious Beliefs Affecting Medical Care: No Physical Barrier Assessment Impaired Vision: No Impaired Hearing: No Decreased Hand dexterity: No Knowledge/Comprehension Assessment Knowledge Level: Medium Comprehension Level: Medium Ability to understand written Medium instructions: Ability to understand verbal Medium instructions: Motivation Assessment Anxiety Level: Calm Cooperation: Cooperative Education Importance: Acknowledges Need Interest in Health Problems: Asks Questions Perception: Coherent Willingness to Engage in Self- Medium Management Activities: Readiness to Engage in Self- Medium Management Activities: Electronic Signature(s) Signed: 10/06/2017 4:38:02 PM By: Montey Hora Entered By: Montey Hora on 10/06/2017 08:32:13 Detlefsen, Toribio Harbour  ([id number]) -------------------------------------------------------------------------------- Fall Risk Assessment Details Patient Name: [name], [name] Date of Service: 10/06/2017 8:00 AM Medical Record Number: [id number] Patient Account Number: [id number] Date of Birth/Sex: 05-15-1961 (56 y.o. Male) Treating RN: Montey Hora Primary Care Lailany Enoch: [name] Other Clinician: Referring Gracyn Santillanes: Juluis Pitch Treating Giovanni Biby/Extender: Cathie Olden in Treatment: 0 Fall Risk Assessment Items Have you had 2 or more falls in the last 12 monthso 0 No Have you had any fall that resulted in injury in the last 12 monthso 0 No FALL RISK ASSESSMENT: History of falling - immediate or within 3 months 25 Yes Secondary diagnosis 0 No Ambulatory aid None/bed rest/wheelchair/nurse 0 No Crutches/cane/walker 15 Yes Furniture 0 No  IV Access/Saline Lock 0 No Gait/Training Normal/bed rest/immobile 0 No Weak 10 Yes Impaired 0 No Mental Status Oriented to own ability 0 Yes Electronic Signature(s) Signed: 10/06/2017 4:38:02 PM By: Montey Hora Entered By: Montey Hora on 10/06/2017 08:32:30 Godeaux, Toribio Harbour ([id number]) -------------------------------------------------------------------------------- Nutrition Risk Assessment Details Patient Name: [name], [name] Date of Service: 10/06/2017 8:00 AM Medical Record Number: [id number] Patient Account Number: [id number] Date of Birth/Sex: 11-Jul-1961 (56 y.o. Male) Treating RN: Montey Hora Primary Care Ela Moffat: [name] Other Clinician: Referring Colie Josten: Lovie Macadamia, DAVID Treating Mandela Bello/Extender: Cathie Olden in Treatment: 0 Height (in): 71 Weight (lbs): 178 Body Mass Index (BMI): 24.8 Nutrition Risk Assessment Items NUTRITION RISK SCREEN: I have an illness or condition that made me change the kind and/or amount of 0 No food I eat I eat fewer than two meals per day 0 No I eat few fruits and  vegetables, or milk products 0 No I have three or more drinks of beer, liquor or wine almost every day 0 No I have tooth or mouth problems that make it hard for me to eat 0 No I don't always have enough money to buy the food I need 0 No I eat alone most of the time 0 No I take three or more different prescribed or over-the-counter drugs a day 1 Yes Without wanting to, I have lost or gained 10 pounds in the last six months 0 No I am not always physically able to shop, cook and/or feed myself 0 No Nutrition Protocols Good Risk Protocol 0 No interventions needed Moderate Risk Protocol Electronic Signature(s) Signed: 10/06/2017 4:38:02 PM By: Montey Hora Entered By: Montey Hora on 10/06/2017 08:32:41

## 2017-10-08 NOTE — Progress Notes (Signed)
Strauss, BAKER MORONTA ([id number]) Visit Report for 10/06/2017 Chief Complaint Document Details Patient Name: Brady, [name] Date of Service: 10/06/2017 8:00 AM Medical Record Number: [id number] Patient Account Number: [id number] Date of Birth/Sex: 1960-11-22 (56 y.o. Male) Treating RN: Montey Hora Primary Care Provider: [name] Other Clinician: Referring Provider: Juluis Pitch Treating Provider/Extender: Cathie Olden in Treatment: 0 Information Obtained from: Patient Chief Complaint He is here for evaluation of sacral pressure ulcer Electronic Signature(s) Signed: 10/06/2017 4:55:16 PM By: Lawanda Cousins Entered By: Lawanda Cousins on 10/06/2017 10:22:40 Brady, [name] ([id number]) -------------------------------------------------------------------------------- Debridement Details Patient Name: Brady, [name] Date of Service: 10/06/2017 8:00 AM Medical Record Number: [id number] Patient Account Number: [id number] Date of Birth/Sex: 08-23-1961 (56 y.o. Male) Treating RN: Montey Hora Primary Care Provider: [name] Other Clinician: Referring Provider: Juluis Pitch Treating Provider/Extender: Cathie Olden in Treatment: 0 Debridement Performed for Wound #1 Midline Sacrum Assessment: Performed By: Physician Lawanda Cousins, NP Debridement: Debridement Pre-procedure Verification/Time Yes - 09:15 Out Taken: Start Time: 09:15 Pain Control: Lidocaine 4% Topical Solution Level: Skin/Subcutaneous Tissue Total Area Debrided (L x W): 3.4 (cm) x 0.8 (cm) = 2.72 (cm) Tissue and other material Viable, Non-Viable, Fibrin/Slough, Subcutaneous debrided: Instrument: Curette Bleeding: Minimum Hemostasis Achieved: Pressure End Time: 09:17 Procedural Pain: 0 Post Procedural Pain: 0 Response to Treatment: Procedure was tolerated well Post Debridement Measurements of Total Wound Length: (cm) 3.4 Stage: Category/Stage III Width: (cm) 0.8 Depth: (cm)  0.2 Volume: (cm) 0.427 Character of Wound/Ulcer Post Improved Debridement: Post Procedure Diagnosis Same as Pre-procedure Electronic Signature(s) Signed: 10/06/2017 4:38:02 PM By: Montey Hora Signed: 10/06/2017 4:55:16 PM By: Lawanda Cousins Entered By: Lawanda Cousins on 10/06/2017 10:22:17 Brady, [name] ([id number]) -------------------------------------------------------------------------------- HPI Details Patient Name: Brady, [name] Date of Service: 10/06/2017 8:00 AM Medical Record Number: [id number] Patient Account Number: [id number] Date of Birth/Sex: 05/15/1961 (56 y.o. Male) Treating RN: Montey Hora Primary Care Provider: [name] Other Clinician: Referring Provider: Juluis Pitch Treating Provider/Extender: Cathie Olden in Treatment: 0 History of Present Illness Location: Patient has a ulcer to Sacrum Quality: denies pain Severity: denies pain Duration: months, but unable to clearly articulate timeframe Context: ulcer occurred secondary to pressure HPI Description: 10-06-17 he is here for initial evaluation of the sacral ulcer. He is unable to articulate exactly this is a states "months". 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 He is currently in a facility, [facility]. He is unsure if he has a pressure reduction mattress. He admits to sitting the majority of the day and sleeping on his back. We discussed the need for lateral sleeping, which he states he is able to do. He voices no complaints of pain, discomfort, fever, chills, ill feeling. He tolerated debridement. Electronic Signature(s) Signed: 10/06/2017 4:55:16 PM By: Lawanda Cousins Entered By: Lawanda Cousins on 10/06/2017 10:44:39 [name] ([id number]) -------------------------------------------------------------------------------- Physical Exam Details Patient Name: Brady, [name] Date of Service: 10/06/2017 8:00  AM Medical Record Number: [id number] Patient Account Number: [id number] Date of Birth/Sex: [birth year] (56 y.o. Male) Treating RN: Montey Hora Primary Care Provider: [name] Other Clinician: Referring Provider: Juluis Pitch Treating Provider/Extender: Lawanda Cousins Weeks in Treatment: 0 Constitutional wnl. afebrile. Respiratory non-labored. Musculoskeletal arrives in wheelchair, assistance to exam chair. Electronic Signature(s) Signed: 10/06/2017 4:55:16 PM By: Lawanda Cousins Entered By: Lawanda Cousins on 10/06/2017 10:45:32 [name] ([id number]) -------------------------------------------------------------------------------- Physician Orders Details Patient Name: Brady, [name] Date of Service: 10/06/2017 8:00 AM  Medical Record Number: [id number] Patient Account Number: [id number] Date of Birth/Sex: 1961-03-21 (56 y.o. Male) Treating RN: Montey Hora Primary Care Provider: [name] Other Clinician: Referring Provider: Juluis Pitch Treating Provider/Extender: Cathie Olden in Treatment: 0 Verbal / Phone Orders: No Diagnosis Coding Wound Cleansing Wound #1 Midline Sacrum o Clean wound with Normal Saline. o May Shower, gently pat wound dry prior to applying new dressing. Anesthetic (add to Medication List) Wound #1 Midline Sacrum o Topical Lidocaine 4% cream applied to wound bed prior to debridement (In Clinic Only). Primary Wound Dressing Wound #1 Midline Sacrum o Prisma Ag - or collagen with silver equivalent Secondary Dressing Wound #1 Midline Sacrum o Dry Gauze o Boardered Foam Dressing Dressing Change Frequency Wound #1 Midline Sacrum o Change dressing every day. Follow-up Appointments o Return Appointment in 2 weeks. Off-Loading Wound #1 Midline Sacrum o Gel wheelchair cushion - SNF to provide gel overlay cushion for wheelchair and dialysis use o Mattress - SNF to please provide Group 2 mattress surface  for patient Additional Orders / Instructions Wound #1 Midline Sacrum o Other: - SNF to provide Dietary Consult for patient Electronic Signature(s) Signed: 10/06/2017 4:55:16 PM By: Lawanda Cousins Entered By: Lawanda Cousins on 10/06/2017 10:45:47 [name], [name] ([id number]) -------------------------------------------------------------------------------- Problem List Details Patient Name: Odonnel, Aijalon B. Date of Service: 10/06/2017 8:00 AM Medical Record Number: [id number] Patient Account Number: [id number] Date of Birth/Sex: 08/12/1961 (56 y.o. Male) Treating RN: Montey Hora Primary Care Provider: [name] Other Clinician: Referring Provider: Juluis Pitch Treating Provider/Extender: Lawanda Cousins Weeks in Treatment: 0 Active Problems ICD-10 Encounter Code Description Active Date Diagnosis L89.153 Pressure ulcer of sacral region, stage 3 10/06/2017 Yes Z89.511 Acquired absence of right leg below knee 10/06/2017 Yes N18.6 End stage renal disease 10/06/2017 Yes E11.622 Type 2 diabetes mellitus with other skin ulcer 10/06/2017 Yes Inactive Problems Resolved Problems Electronic Signature(s) Signed: 10/06/2017 4:55:16 PM By: Lawanda Cousins Entered By: Lawanda Cousins on 10/06/2017 10:21:36 [name] ([id number]) -------------------------------------------------------------------------------- Progress Note Details Patient Name: Brady, [name] Date of Service: 10/06/2017 8:00 AM Medical Record Number: [id number] Patient Account Number: [id number] Date of Birth/Sex: 1961-05-22 (56 y.o. Male) Treating RN: Montey Hora Primary Care Provider: [name] Other Clinician: Referring Provider: Juluis Pitch Treating Provider/Extender: Cathie Olden in Treatment: 0 Subjective Chief Complaint Information obtained from Patient He is here for evaluation of sacral pressure ulcer History of Present Illness (HPI) The following HPI elements were documented  for the patient's wound: Location: Patient has a ulcer to Sacrum Quality: denies pain Severity: denies pain Duration: months, but unable to clearly articulate timeframe Context: ulcer occurred secondary to pressure 10-06-17 he is here for initial evaluation of the sacral ulcer. He is unable to articulate exactly this is a states "months". 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 He is currently in a facility, [facility]. He is unsure if he has a pressure reduction mattress. He admits to sitting the majority of the day and sleeping on his back. We discussed the need for lateral sleeping, which he states he is able to do. He voices no complaints of pain, discomfort, fever, chills, ill feeling. He tolerated debridement. Wound History Patient presents with 1 open wound that has been present for approximately about 2 months. Patient has been treating wound in the following manner: santyl. Laboratory tests have not been performed in the last month. Patient reportedly has not tested positive for an antibiotic resistant  organism. Patient reportedly has not tested positive for osteomyelitis. Patient reportedly has had testing performed to evaluate circulation in the legs. Patient History Information obtained from Patient. Allergies adhesive, cefepime, methotrexate, silicon, vancomycin Family History Diabetes - Mother,Siblings, No family history of Cancer, Heart Disease, Hereditary Spherocytosis, Hypertension, Kidney Disease, Lung Disease, Seizures, Stroke, Thyroid Problems, Tuberculosis. Social History Former smoker - quit 20+ years ago, Marital Status - Married, Alcohol Use - Never, Drug Use - No History, Caffeine Use - Daily. Medical History Eyes Denies history of Cataracts, Glaucoma, Optic Neuritis Hematologic/Lymphatic Denies history of Anemia, Hemophilia, Human Immunodeficiency Virus, Lymphedema, Sickle Cell Disease Erb, Ashwin B.  ([id number]) Respiratory Denies history of Aspiration, Asthma, Chronic Obstructive Pulmonary Disease (COPD), Pneumothorax, Sleep Apnea, Tuberculosis Cardiovascular Patient has history of Hypertension, Peripheral Arterial Disease Denies history of Angina, Arrhythmia, Congestive Heart Failure, Coronary Artery Disease, Deep Vein Thrombosis, Hypotension, Myocardial Infarction, Peripheral Venous Disease, Phlebitis, Vasculitis Gastrointestinal Patient has history of Crohn s Denies history of Cirrhosis , Colitis, Hepatitis A, Hepatitis B, Hepatitis C Endocrine Patient has history of Type II Diabetes Immunological Denies history of Lupus Erythematosus, Raynaud s, Scleroderma Musculoskeletal Denies history of Gout, Rheumatoid Arthritis, Osteoarthritis, Osteomyelitis Neurologic Denies history of Dementia, Neuropathy, Quadriplegia, Paraplegia, Seizure Disorder Oncologic Denies history of Received Chemotherapy, Received Radiation Patient is treated with Insulin. Blood sugar is tested. Medical And Surgical History Notes Genitourinary renal disorder - CKD Neurologic CVA - 3 months ago Review of Systems (ROS) Constitutional Symptoms (General Health) The patient has no complaints or symptoms. Eyes The patient has no complaints or symptoms. Ear/Nose/Mouth/Throat The patient has no complaints or symptoms. Hematologic/Lymphatic The patient has no complaints or symptoms. Respiratory The patient has no complaints or symptoms. Genitourinary Complains or has symptoms of Kidney failure/ Dialysis - HD MWF. Immunological The patient has no complaints or symptoms. Integumentary (Skin) The patient has no complaints or symptoms. Musculoskeletal The patient has no complaints or symptoms. Neurologic The patient has no complaints or symptoms. Oncologic The patient has no complaints or symptoms. Psychiatric The patient has no complaints or symptoms. Wiland, Timmothy B.  ([id number]) Objective Constitutional wnl. afebrile. Vitals Time Taken: 8:29 AM, Height: 71 in, Source: Measured, Weight: 178 lbs, Source: Measured, BMI: 24.8, Temperature: 98.1 F, Pulse: 68 bpm, Respiratory Rate: 18 breaths/min, Blood Pressure: 134/75 mmHg. Respiratory non-labored. Musculoskeletal arrives in wheelchair, assistance to exam chair. Integumentary (Hair, Skin) Wound #1 status is Open. Original cause of wound was Pressure Injury. The wound is located on the Midline Sacrum. The wound measures 3.4cm length x 0.8cm width x 0.1cm depth; 2.136cm^2 area and 0.214cm^3 volume. There is Fat Layer (Subcutaneous Tissue) Exposed exposed. There is no tunneling or undermining noted. There is a large amount of purulent drainage noted. The wound margin is flat and intact. There is small (1-33%) red granulation within the wound bed. There is a large (67-100%) amount of necrotic tissue within the wound bed including Adherent Slough. The periwound skin appearance did not exhibit: Callus, Crepitus, Excoriation, Induration, Rash, Scarring, Dry/Scaly, Maceration, Atrophie Blanche, Cyanosis, Ecchymosis, Hemosiderin Staining, Mottled, Pallor, Rubor, Erythema. Periwound temperature was noted as No Abnormality. The periwound has tenderness on palpation. Assessment Active Problems ICD-10 L89.153 - Pressure ulcer of sacral region, stage 3 Z89.511 - Acquired absence of right leg below knee N18.6 - End stage renal disease E11.622 - Type 2 diabetes mellitus with other skin ulcer Procedures Wound #1 Pre-procedure diagnosis of Wound #1 is a Pressure Ulcer located on the Midline Sacrum . There was a Skin/Subcutaneous Tissue Debridement ([id number])  debridement with total area of 2.72 sq cm performed by Lawanda Cousins, NP. with the following instrument(s): Curette to remove Viable and Non-Viable tissue/material including Fibrin/Slough and Subcutaneous after achieving pain control using Lidocaine 4% Topical  Solution. A time out was conducted at 09:15, prior to the start of the procedure. A Minimum amount of bleeding was controlled with Pressure. The procedure was tolerated well with a pain level of 0 throughout and a pain level of 0 following the procedure. Post Debridement Measurements: 3.4cm length x 0.8cm width x 0.2cm depth; 0.427cm^3 volume. Post debridement Stage noted as Category/Stage III. Brady, [name] ([id number]) Character of Wound/Ulcer Post Debridement is improved. Post procedure Diagnosis Wound #1: Same as Pre-Procedure Plan Wound Cleansing: Wound #1 Midline Sacrum: Clean wound with Normal Saline. May Shower, gently pat wound dry prior to applying new dressing. Anesthetic (add to Medication List): Wound #1 Midline Sacrum: Topical Lidocaine 4% cream applied to wound bed prior to debridement (In Clinic Only). Primary Wound Dressing: Wound #1 Midline Sacrum: Prisma Ag - or collagen with silver equivalent Secondary Dressing: Wound #1 Midline Sacrum: Dry Gauze Boardered Foam Dressing Dressing Change Frequency: Wound #1 Midline Sacrum: Change dressing every day. Follow-up Appointments: Return Appointment in 2 weeks. Off-Loading: Wound #1 Midline Sacrum: Gel wheelchair cushion - SNF to provide gel overlay cushion for wheelchair and dialysis use Mattress - SNF to please provide Group 2 mattress surface for patient Additional Orders / Instructions: Wound #1 Midline Sacrum: Other: - SNF to provide Dietary Consult for patient 1. group II surface 2. pressure reditribution chair cushion 3. offloading/side sleeping 4. dietician evaluation at facility, will follow their recommendations 5. prisma, border foam 6. follow up next week Brady, [name] ([id number]) Electronic Signature(s) Signed: 10/06/2017 4:55:16 PM By: Lawanda Cousins Entered By: Lawanda Cousins on 10/06/2017 10:47:57 [name]  ([id number]) -------------------------------------------------------------------------------- ROS/PFSH Details Patient Name: Rabideau, Boruch B. Date of Service: 10/06/2017 8:00 AM Medical Record Number: [id number] Patient Account Number: [id number] Date of Birth/Sex: 01/16/1961 (56 y.o. Male) Treating RN: Montey Hora Primary Care Provider: [name] Other Clinician: Referring Provider: Lovie Macadamia, DAVID Treating Provider/Extender: Cathie Olden in Treatment: 0 Information Obtained From Patient Wound History Do you currently have one or more open woundso Yes How many open wounds do you currently haveo 1 Approximately how long have you had your woundso about 2 months How have you been treating your wound(s) until nowo santyl Has your wound(s) ever healed and then re-openedo No Have you had any lab work done in the past montho No Have you tested positive for an antibiotic resistant organism (MRSA, VRE)o No Have you tested positive for osteomyelitis (bone infection)o No Have you had any tests for circulation on your legso Yes Who ordered the testo PCP Where was the test doneo [facility] Genitourinary Complaints and Symptoms: Positive for: Kidney failure/ Dialysis - HD MWF Medical History: Past Medical History Notes: renal disorder - CKD Constitutional Symptoms (General Health) Complaints and Symptoms: No Complaints or Symptoms Eyes Complaints and Symptoms: No Complaints or Symptoms Medical History: Negative for: Cataracts; Glaucoma; Optic Neuritis Ear/Nose/Mouth/Throat Complaints and Symptoms: No Complaints or Symptoms Hematologic/Lymphatic Complaints and Symptoms: No Complaints or Symptoms Medical History: Ramo, Yasmin B. ([id number]) Negative for: Anemia; Hemophilia; Human Immunodeficiency Virus; Lymphedema; Sickle Cell Disease Respiratory Complaints and Symptoms: No Complaints or Symptoms Medical History: Negative for: Aspiration; Asthma; Chronic Obstructive  Pulmonary Disease (COPD); Pneumothorax; Sleep Apnea; Tuberculosis Cardiovascular Medical History: Positive for: Hypertension; Peripheral Arterial Disease Negative for: Angina; Arrhythmia; Congestive Heart Failure; Coronary  Artery Disease; Deep Vein Thrombosis; Hypotension; Myocardial Infarction; Peripheral Venous Disease; Phlebitis; Vasculitis Gastrointestinal Medical History: Positive for: Crohnos Negative for: Cirrhosis ; Colitis; Hepatitis A; Hepatitis B; Hepatitis C Endocrine Medical History: Positive for: Type II Diabetes Time with diabetes: 20+ years Treated with: Insulin Blood sugar tested every day: Yes Tested : Immunological Complaints and Symptoms: No Complaints or Symptoms Medical History: Negative for: Lupus Erythematosus; Raynaudos; Scleroderma Integumentary (Skin) Complaints and Symptoms: No Complaints or Symptoms Musculoskeletal Complaints and Symptoms: No Complaints or Symptoms Medical History: Negative for: Gout; Rheumatoid Arthritis; Osteoarthritis; Osteomyelitis Neurologic Complaints and Symptoms: No Complaints or Symptoms Medical History: Cooter, Abdalrahman B. ([id number]) Negative for: Dementia; Neuropathy; Quadriplegia; Paraplegia; Seizure Disorder Past Medical History Notes: CVA - 3 months ago Oncologic Complaints and Symptoms: No Complaints or Symptoms Medical History: Negative for: Received Chemotherapy; Received Radiation Psychiatric Complaints and Symptoms: No Complaints or Symptoms Immunizations Pneumococcal Vaccine: Received Pneumococcal Vaccination: Yes Implantable Devices Family and Social History Cancer: No; Diabetes: Yes - Mother,Siblings; Heart Disease: No; Hereditary Spherocytosis: No; Hypertension: No; Kidney Disease: No; Lung Disease: No; Seizures: No; Stroke: No; Thyroid Problems: No; Tuberculosis: No; Former smoker - quit 20+ years ago; Marital Status - Married; Alcohol Use: Never; Drug Use: No History; Caffeine Use: Daily;  Financial Concerns: No; Food, Clothing or Shelter Needs: No; Support System Lacking: No; Transportation Concerns: No; Advanced Directives: No; Patient does not want information on Advanced Directives Electronic Signature(s) Signed: 10/06/2017 4:38:02 PM By: Montey Hora Signed: 10/06/2017 4:55:16 PM By: Lawanda Cousins Entered By: Montey Hora on 10/06/2017 09:16:54 Vara, [name] ([id number]) -------------------------------------------------------------------------------- [place] Details Patient Name: Stoltz, Trell B. Date of Service: 10/06/2017 Medical Record Number: [id number] Patient Account Number: [id number] Date of Birth/Sex: August 05, 1961 (56 y.o. Male) Treating RN: Montey Hora Primary Care Provider: [name] Other Clinician: Referring Provider: Juluis Pitch Treating Provider/Extender: Cathie Olden in Treatment: 0 Diagnosis Coding ICD-10 Codes Code Description [phone] Pressure ulcer of sacral region, stage 3 Z89.511 Acquired absence of right leg below knee N18.6 End stage renal disease E11.622 Type 2 diabetes mellitus with other skin ulcer Facility Procedures CPT4 Code: [id number] Description: 99213 - WOUND CARE VISIT-LEV 3 EST PT Modifier: Quantity: 1 CPT4 Code: 60630160 Description: 11042 - DEB SUBQ TISSUE 20 SQ CM/< ICD-10 Diagnosis Description L89.153 Pressure ulcer of sacral region, stage 3 Modifier: Quantity: 1 Physician Procedures CPT4 Code: [id number] Description: WC PHYS LEVEL 3 o NEW PT ICD-10 Diagnosis Description L89.153 Pressure ulcer of sacral region, stage 3 E11.622 Type 2 diabetes mellitus with other skin ulcer Modifier: Quantity: 1 CPT4 Code: [id number] Description: 11042 - WC PHYS SUBQ TISS 20 SQ CM ICD-10 Diagnosis Description L89.153 Pressure ulcer of sacral region, stage 3 Modifier: Quantity: 1 Electronic Signature(s) Signed: 10/06/2017 4:55:16 PM By: Lawanda Cousins Entered By: Lawanda Cousins on 10/06/2017 10:48:29

## 2017-10-14 ENCOUNTER — Other Ambulatory Visit
Admission: RE | Admit: 2017-10-14 | Discharge: 2017-10-14 | Disposition: A | Discharge status: Home / Self Care | Payer: BLUE CROSS/BLUE SHIELD | Source: Ambulatory Visit | Attending: Internal Medicine | Admitting: Internal Medicine

## 2017-10-14 ENCOUNTER — Ambulatory Visit (INDEPENDENT_AMBULATORY_CARE_PROVIDER_SITE_OTHER): Payer: BLUE CROSS/BLUE SHIELD | Admitting: Podiatry

## 2017-10-14 DIAGNOSIS — I70245 Atherosclerosis of native arteries of left leg with ulceration of other part of foot: Secondary | ICD-10-CM

## 2017-10-14 DIAGNOSIS — E0843 Diabetes mellitus due to underlying condition with diabetic autonomic (poly)neuropathy: Secondary | ICD-10-CM | POA: Diagnosis not present

## 2017-10-14 DIAGNOSIS — L97522 Non-pressure chronic ulcer of other part of left foot with fat layer exposed: Secondary | ICD-10-CM

## 2017-10-14 DIAGNOSIS — N186 End stage renal disease: Secondary | ICD-10-CM | POA: Diagnosis present

## 2017-10-14 LAB — HEMOGLOBIN: Hemoglobin: 9 g/dL — ABNORMAL LOW (ref 13.0–18.0)

## 2017-10-20 ENCOUNTER — Encounter: Payer: Self-pay | Admitting: Family Medicine

## 2017-10-20 ENCOUNTER — Ambulatory Visit: Payer: BLUE CROSS/BLUE SHIELD | Admitting: Family Medicine

## 2017-10-20 ENCOUNTER — Ambulatory Visit: Payer: BLUE CROSS/BLUE SHIELD | Admitting: Physician Assistant

## 2017-10-20 VITALS — BP 118/72 | HR 76 | Temp 98.8°F | Resp 16

## 2017-10-20 DIAGNOSIS — Z89511 Acquired absence of right leg below knee: Secondary | ICD-10-CM | POA: Diagnosis not present

## 2017-10-20 DIAGNOSIS — Z794 Long term (current) use of insulin: Secondary | ICD-10-CM | POA: Diagnosis not present

## 2017-10-20 DIAGNOSIS — I999 Unspecified disorder of circulatory system: Secondary | ICD-10-CM | POA: Diagnosis not present

## 2017-10-20 DIAGNOSIS — E1129 Type 2 diabetes mellitus with other diabetic kidney complication: Secondary | ICD-10-CM | POA: Diagnosis not present

## 2017-10-20 DIAGNOSIS — I739 Peripheral vascular disease, unspecified: Secondary | ICD-10-CM

## 2017-10-20 LAB — POCT GLYCOSYLATED HEMOGLOBIN (HGB A1C)
Est. average glucose Bld gHb Est-mCnc: 123
Hemoglobin A1C: 5.9

## 2017-10-20 LAB — HM DIABETES EYE EXAM

## 2017-10-20 MED ORDER — INSULIN PEN NEEDLE 32G X 6 MM MISC: 12 refills | Status: DC

## 2017-10-20 MED ORDER — GABAPENTIN 100 MG PO CAPS: 200.0000 mg | ORAL_CAPSULE | Freq: Two times a day (BID) | ORAL | 12 refills | Status: DC

## 2017-10-20 MED ORDER — ALCOHOL SWABS PADS: MEDICATED_PAD | 12 refills | Status: DC

## 2017-10-20 MED ORDER — GLUCOSE BLOOD VI STRP: ORAL_STRIP | 12 refills | Status: DC

## 2017-10-20 NOTE — Progress Notes (Signed)
Patient: [name] Male    DOB: September 14, 1961   57 y.o.   MRN: [id number] Visit Date: 10/20/2017  Today's Provider: [name], MD   Chief Complaint  Patient presents with  . Transitions Of Care   Subjective:    HPI  Follow-up for foot amputation: From 09/07/2017-patient had amputation of right leg, below the knee due to ischemia. Patient was discharged from hospital to [facility] on 12/27. His wife reports they consult was ordered for [facility] and are waiting to here from them to schedule first home visit. . Patient is being followed by wound care and has an upcoming appointment 10/25/2017 at 1:45pm. He has follow up vascular surgery on 11/01/2016. Wife is changing dressings daily and reports it has been dry with no redness. Dressing is dry today.  Is currently taking 12 units Lantus insulin and reports fasting sugars have been 80s to 90s. Is starting to eat better, but still not needs to use short acting insulin.   Continue 3x weekly dialysis in [place].    Allergies  Allergen Reactions  . Vancomycin Shortness Of Breath    Eyes watering, SOB, wheezing  . Methotrexate Other (See Comments)    Blood count drops  . Tape      Current Outpatient Medications:  .  amLODipine (NORVASC) 10 MG tablet, Take 1 tablet (10 mg total) by mouth daily as needed., Disp: 30 tablet, Rfl: 5 .  aspirin EC 81 MG tablet, Take 81 mg by mouth daily., Disp: , Rfl:  .  atorvastatin (LIPITOR) 80 MG tablet, Take 1 tablet (80 mg total) by mouth daily., Disp: 90 tablet, Rfl: 3 .  calcitRIOL (ROCALTROL) 0.25 MCG capsule, Take 0.5 mcg by mouth daily. , Disp: , Rfl: 1 .  carvedilol (COREG) 25 MG tablet, Take 12.5 mg by mouth 2 (two) times daily. , Disp: , Rfl: 3 .  clopidogrel (PLAVIX) 75 MG tablet, Take 1 tablet (75 mg total) by mouth daily., Disp: 30 tablet, Rfl: 11 .  ferrous sulfate 325 (65 FE) MG tablet, Take 1 tablet by mouth daily., Disp: , Rfl:  .  furosemide (LASIX) 80 MG tablet,  Take 1 tablet (80 mg total) by mouth 2 (two) times daily., Disp: 60 tablet, Rfl: 4 .  gabapentin (NEURONTIN) 100 MG capsule, Take 200 mg by mouth 2 (two) times daily., Disp: , Rfl:  .  glucose blood (BAYER CONTOUR TEST) test strip, Check sugar three times daily for insulin dependent diabetes, Disp: 100 each, Rfl: 12 .  HYDROcodone-acetaminophen (NORCO/VICODIN) 5-325 MG tablet, Take 1 tablet by mouth every 4 (four) hours as needed., Disp: , Rfl:  .  inFLIXimab (REMICADE) 100 MG injection, Inject into the vein every 6 (six) weeks. , Disp: , Rfl:  .  insulin aspart (NOVOLOG FLEXPEN) 100 UNIT/ML FlexPen, Take 8-15 units three times a day before each meal, Disp: 1 pen, Rfl: 3 .  insulin glargine (LANTUS) 100 UNIT/ML injection, Inject 0.4 mLs (40 Units total) into the skin daily at 10 pm. (Patient taking differently: Inject 7 Units into the skin at bedtime. ), Disp: 10 mL, Rfl: 11 .  levETIRAcetam (KEPPRA) 1000 MG tablet, Take 1,000 mg by mouth at bedtime. , Disp: , Rfl: 0 .  lisinopril (PRINIVIL,ZESTRIL) 10 MG tablet, Take 1 tablet by mouth daily., Disp: , Rfl: 1 .  mirtazapine (REMERON) 15 MG tablet, Take 7.5 mg by mouth at bedtime., Disp: , Rfl:  .  nystatin (MYCOSTATIN) 100000 UNIT/ML suspension, Take  5 mLs (500,000 Units total) by mouth 4 (four) times daily., Disp: 60 mL, Rfl: 2 .  polyethylene glycol (MIRALAX / GLYCOLAX) packet, Take 1 packet by mouth as needed., Disp: , Rfl: 0 .  sevelamer carbonate (RENVELA) 800 MG tablet, Take 800 mg by mouth 3 (three) times daily., Disp: , Rfl:  .  sildenafil (VIAGRA) 100 MG tablet, Take 1 tablet (100 mg total) by mouth daily as needed., Disp: 6 tablet, Rfl: 1 .  sodium bicarbonate 650 MG tablet, Take 650 mg by mouth 2 (two) times daily., Disp: , Rfl:  .  triamcinolone ointment (KENALOG) 0.1 %, Apply 1 application topically as needed. , Disp: , Rfl: 0  Review of Systems  Constitutional: Negative for appetite change, chills and fever.  Respiratory: Negative for  chest tightness, shortness of breath and wheezing.   Cardiovascular: Negative for chest pain and palpitations.  Gastrointestinal: Negative for abdominal pain, nausea and vomiting.    Social History   Tobacco Use  . Smoking status: Never Smoker  . Smokeless tobacco: Never Used  Substance Use Topics  . Alcohol use: No   Objective:   BP 118/72 (BP Location: Left Arm, Patient Position: Sitting, Cuff Size: Large)   Pulse 76   Temp 98.8 F (37.1 C) (Oral)   Resp 16     Physical Exam   General Appearance:    Alert, cooperative, no distress  Eyes:    PERRL, conjunctiva/corneas clear, EOM's intact       Lungs:     Clear to auscultation bilaterally, respirations unlabored  Heart:    Regular rate and rhythm      Results for orders placed or performed in visit on 10/20/17  POCT HgB A1C  Result Value Ref Range   Hemoglobin A1C 5.9    Est. average glucose Bld gHb Est-mCnc 123         Assessment & Plan:      1. Type 2 diabetes mellitus with other diabetic kidney complication, with long-term current use of insulin (HCC) Well controlled.  Continue current medications.   - POCT HgB A1C - Alcohol Swabs PADS; Use as directed to check blood sugar three times daily for insulin dependent type 2 diabetes.  Dispense: 100 each; Refill: 12  2. S/P BKA (below knee amputation) unilateral, right ([place]) Doing well post-op, no signs of infection. Follow up vascular surgery 11-01-17 as scheduled.   3. Vascular disorder of lower extremity Tolerating increased statin dose well. Check lipids at follow up in about 6 weeks.        [name], MD  [facility]

## 2017-10-20 NOTE — Progress Notes (Signed)
   Subjective:  57 year old male with past medical history of type 2 diabetes mellitus and right BKA presents today for evaluation of an ulceration to the left great toe that appeared about 1 month ago.  He describes it as a black spot on the toe and states it is worsening.  He reports associated pain. Touching the area or applying pressure increases the pain.  There are no alleviating factors noted.  He has not done anything to treat the symptoms. He also complains of elongated, thickened nails of the left foot.  He reports pain while wearing shoes. He is unable to trim his own nails. Patient presents today for further treatment and evaluation.   Past Medical History:  Diagnosis Date  . Anemia   . Crohn disease ([place])   . Diabetes mellitus without complication ([place])   . DVT of lower extremity (deep venous thrombosis) ([place]) 2016  . Hidradenitis suppurativa   . Hypertension   . ICH (intracerebral hemorrhage) ([place])   . Renal disorder       Objective/Physical Exam General: The patient is alert and oriented x3 in no acute distress.  Dermatology:  Wound #1 noted to the left great toe measuring approximately 0.3 x 0.2 x 0.1 cm (LxWxD).   To the noted ulceration(s), there is no eschar. There is a moderate amount of slough, fibrin, and necrotic tissue noted. Granulation tissue and wound base is red. There is a minimal amount of serosanguineous drainage noted. There is no exposed bone muscle-tendon ligament or joint. There is no malodor. Periwound integrity is intact. Skin is warm, dry and supple bilateral lower extremities. Nails 1-5 of the left foot are tender, long, thickened and dystrophic with subungual debris, consistent with onychomycosis. No signs of infection noted.   Vascular: Palpable pedal pulses bilaterally. No edema or erythema noted. Capillary refill within normal limits.  Neurological: Epicritic and protective threshold absent bilaterally.   Musculoskeletal Exam: Range of  motion within normal limits to all pedal and ankle joints bilateral. Muscle strength 5/5 in all groups bilateral.   Assessment: #1 ulceration of the left great toe secondary to diabetes mellitus #2 diabetes mellitus w/ peripheral neuropathy #3 Onychomycosis of nail due to dermatophyte left #4 History of BKA right   Plan of Care:  #1 Patient was evaluated. #2 medically necessary excisional debridement including subcutaneous tissue was performed using a tissue nipper and a chisel blade. Excisional debridement of all the necrotic nonviable tissue down to healthy bleeding viable tissue was performed with post-debridement measurements same as pre-. #3 the wound was cleansed and dry sterile dressing applied. #4 patient recently had right BKA at [facility] on 09/07/17. #5 Mechanical debridement of nails 1-5 of the left foot performed using a nail nipper. Filed with dremel without incident. #6 Recommended antibiotic ointment and Band-Aid daily. #7 return to clinic in 3 weeks.   Edrick Kins, DPM [facility]  Dr. Edrick Kins, [place]                                        [place], [place] [address]                Office [phone]  Fax [phone]

## 2017-10-25 ENCOUNTER — Encounter: Payer: BLUE CROSS/BLUE SHIELD | Attending: Physician Assistant | Admitting: Physician Assistant

## 2017-10-25 ENCOUNTER — Telehealth: Payer: Self-pay | Admitting: Family Medicine

## 2017-10-25 DIAGNOSIS — L89153 Pressure ulcer of sacral region, stage 3: Secondary | ICD-10-CM | POA: Diagnosis not present

## 2017-10-25 DIAGNOSIS — Z89511 Acquired absence of right leg below knee: Secondary | ICD-10-CM | POA: Insufficient documentation

## 2017-10-25 DIAGNOSIS — E11622 Type 2 diabetes mellitus with other skin ulcer: Secondary | ICD-10-CM | POA: Diagnosis present

## 2017-10-25 DIAGNOSIS — N186 End stage renal disease: Secondary | ICD-10-CM | POA: Insufficient documentation

## 2017-10-25 DIAGNOSIS — Z8673 Personal history of transient ischemic attack (TIA), and cerebral infarction without residual deficits: Secondary | ICD-10-CM | POA: Diagnosis not present

## 2017-10-25 DIAGNOSIS — Z794 Long term (current) use of insulin: Secondary | ICD-10-CM | POA: Insufficient documentation

## 2017-10-25 DIAGNOSIS — Z992 Dependence on renal dialysis: Secondary | ICD-10-CM | POA: Insufficient documentation

## 2017-10-25 DIAGNOSIS — Z87891 Personal history of nicotine dependence: Secondary | ICD-10-CM | POA: Insufficient documentation

## 2017-10-25 DIAGNOSIS — I120 Hypertensive chronic kidney disease with stage 5 chronic kidney disease or end stage renal disease: Secondary | ICD-10-CM | POA: Diagnosis not present

## 2017-10-25 DIAGNOSIS — E1122 Type 2 diabetes mellitus with diabetic chronic kidney disease: Secondary | ICD-10-CM | POA: Insufficient documentation

## 2017-10-25 NOTE — Telephone Encounter (Signed)
Leda Gauze physical therapist from [facility] called and need verbal orders for PT on this patient.   CB# [phone]

## 2017-10-25 NOTE — Telephone Encounter (Signed)
OK 

## 2017-10-25 NOTE — Telephone Encounter (Signed)
Marya Amsler was given verbal order.

## 2017-10-26 ENCOUNTER — Encounter: Payer: Self-pay | Admitting: *Deleted

## 2017-10-26 NOTE — Progress Notes (Signed)
Viles, KATAI MARSICO ([id number]) Visit Report for 10/25/2017 Arrival Information Details Patient Name: [name], [name]. Date of Service: 10/25/2017 1:45 PM Medical Record Number: [id number] Patient Account Number: [id number] Date of Birth/Sex: [birth year] (57 y.o. Male) Treating RN: Roger Shelter Primary Care Haden Cavenaugh: [name] Other Clinician: Referring Teal Bontrager: [name] Treating Dyamon Sosinski/Extender: Melburn Hake, HOYT Weeks in Treatment: 2 Visit Information History Since Last Visit All ordered tests and consults were completed: No Patient Arrived: Wheel Chair Added or deleted any medications: No Arrival Time: 13:04 Any new allergies or adverse reactions: No Accompanied By: caregiver Had a fall or experienced change in No Transfer Assistance: EasyPivot Patient activities of daily living that may affect Lift risk of falls: Patient Identification Verified: Yes Signs or symptoms of abuse/neglect since last visito No Secondary Verification Process Yes Hospitalized since last visit: No Completed: Pain Present Now: No Patient Has Alerts: Yes Patient Alerts: DMII Electronic Signature(s) Signed: 10/25/2017 2:51:30 PM By: Roger Shelter Entered By: Roger Shelter on 10/25/2017 13:04:35 [name], [name] ([id number]) -------------------------------------------------------------------------------- Encounter Discharge Information Details Patient Name: [name], [name]. Date of Service: 10/25/2017 1:45 PM Medical Record Number: [id number] Patient Account Number: [id number] Date of Birth/Sex: 02/11/1961 (57 y.o. Male) Treating RN: Roger Shelter Primary Care Marquice Uddin: [name] Other Clinician: Referring Eion Timbrook: [name] Treating Rosan Calbert/Extender: Melburn Hake, HOYT Weeks in Treatment: 2 Encounter Discharge Information Items Discharge Pain Level: 0 Discharge Condition: Stable Ambulatory Status: Wheelchair Discharge Destination: Home Transportation: Private  Auto Accompanied By: brother Schedule Follow-up Appointment: Yes Medication Reconciliation completed and No provided to Patient/Care Mateus Rewerts: Provided on Clinical Summary of Care: 10/25/2017 Form Type Recipient Paper Patient MM Electronic Signature(s) Signed: 10/25/2017 2:51:30 PM By: Roger Shelter Entered By: Roger Shelter on 10/25/2017 13:29:46 [name], [name] ([id number]) -------------------------------------------------------------------------------- Lower Extremity Assessment Details Patient Name: [name]. Date of Service: 10/25/2017 1:45 PM Medical Record Number: [id number] Patient Account Number: [id number] Date of Birth/Sex: [birth year] (57 y.o. Male) Treating RN: Roger Shelter Primary Care Caeleigh Prohaska: [name] Other Clinician: Referring Vesta Wheeland: [name] Treating Jacquees Gongora/Extender: Melburn Hake, HOYT Weeks in Treatment: 2 Electronic Signature(s) Signed: 10/25/2017 2:51:30 PM By: Roger Shelter Entered By: Roger Shelter on 10/25/2017 13:11:22 [name]. ([id number]) -------------------------------------------------------------------------------- Multi Wound Chart Details Patient Name: [name]. Date of Service: 10/25/2017 1:45 PM Medical Record Number: [id number] Patient Account Number: [id number] Date of Birth/Sex: 1960-11-05 (57 y.o. Male) Treating RN: Roger Shelter Primary Care Maria Gallicchio: [name] Other Clinician: Referring Suhana Wilner: [name] Treating Shashana Fullington/Extender: Melburn Hake, HOYT Weeks in Treatment: 2 Vital Signs Height(in): 71 Pulse(bpm): 78 Weight(lbs): 178 Blood Pressure(mmHg): 142/69 Body Mass Index(BMI): 25 Temperature(F): 97.9 Respiratory Rate 18 (breaths/min): Photos: [1:No Photos] [N/A:N/A] Wound Location: [1:Sacrum - Midline] [N/A:N/A] Wounding Event: [1:Pressure Injury] [N/A:N/A] Primary Etiology: [1:Pressure Ulcer] [N/A:N/A] Comorbid History: [1:Hypertension, Peripheral Arterial Disease,  Crohnos, Type II Diabetes] [N/A:N/A] Date Acquired: [1:09/05/2017] [N/A:N/A] Weeks of Treatment: [1:2] [N/A:N/A] Wound Status: [1:Open] [N/A:N/A] Measurements L x W x D [1:1.4x0.6x0.1] [N/A:N/A] (cm) Area (cm) : [1:0.66] [N/A:N/A] Volume (cm) : [1:0.066] [N/A:N/A] % Reduction in Area: [1:69.10%] [N/A:N/A] % Reduction in Volume: [1:69.20%] [N/A:N/A] Classification: [1:Category/Stage III] [N/A:N/A] Exudate Amount: [1:Medium] [N/A:N/A] Exudate Type: [1:Serous] [N/A:N/A] Exudate Color: [1:amber] [N/A:N/A] Wound Margin: [1:Flat and Intact] [N/A:N/A] Granulation Amount: [1:Medium (34-66%)] [N/A:N/A] Granulation Quality: [1:Red] [N/A:N/A] Necrotic Amount: [1:Medium (34-66%)] [N/A:N/A] Exposed Structures: [1:Fat Layer (Subcutaneous Tissue) Exposed: Yes Fascia: No Tendon: No Muscle: No Joint: No Bone: No] [N/A:N/A] Epithelialization: [1:None] [N/A:N/A] Periwound Skin Texture: [1:Scarring: Yes Excoriation: No Induration: No Callus: No] [N/A:N/A] Crepitus: No Rash:  No Periwound Skin Moisture: Maceration: No N/A N/A Dry/Scaly: No Periwound Skin Color: Atrophie Blanche: No N/A N/A Cyanosis: No Ecchymosis: No Erythema: No Hemosiderin Staining: No Mottled: No Pallor: No Rubor: No Temperature: No Abnormality N/A N/A Tenderness on Palpation: Yes N/A N/A Wound Preparation: Ulcer Cleansing: N/A N/A Rinsed/Irrigated with Saline Topical Anesthetic Applied: Other: lidocaine 4% Treatment Notes Electronic Signature(s) Signed: 10/25/2017 2:51:30 PM By: Roger Shelter Entered By: Roger Shelter on 10/25/2017 13:16:28 [name], [name] ([id number]) -------------------------------------------------------------------------------- [place] Details Patient Name: [name], [name]. Date of Service: 10/25/2017 1:45 PM Medical Record Number: [id number] Patient Account Number: [id number] Date of Birth/Sex: 07/12/1961 (57 y.o. Male) Treating RN: Roger Shelter Primary Care  Zayana Salvador: [name] Other Clinician: Referring Harriett Azar: [name] Treating Thales Knipple/Extender: Melburn Hake, HOYT Weeks in Treatment: 2 Active Inactive ` Abuse / Safety / Falls / Self Care Management Nursing Diagnoses: Impaired physical mobility Goals: Patient will remain injury free related to falls Date Initiated: 10/06/2017 Target Resolution Date: 12/24/2017 Goal Status: Active Interventions: Assess fall risk on admission and as needed Notes: ` Orientation to the Wound Care Program Nursing Diagnoses: Knowledge deficit related to the wound healing center program Goals: Patient/caregiver will verbalize understanding of the [place] Program Date Initiated: 10/06/2017 Target Resolution Date: 12/24/2017 Goal Status: Active Interventions: Provide education on orientation to the wound center Notes: ` Wound/Skin Impairment Nursing Diagnoses: Impaired tissue integrity Goals: Ulcer/skin breakdown will heal within 14 weeks Date Initiated: 10/06/2017 Target Resolution Date: 12/24/2017 Goal Status: Active Interventions: [name], [name]. ([id number]) Assess patient/caregiver ability to obtain necessary supplies Assess patient/caregiver ability to perform ulcer/skin care regimen upon admission and as needed Assess ulceration(s) every visit Notes: Electronic Signature(s) Signed: 10/25/2017 2:51:30 PM By: Roger Shelter Entered By: Roger Shelter on 10/25/2017 13:16:18 [name], [name]. ([id number]) -------------------------------------------------------------------------------- Pain Assessment Details Patient Name: [name], [name]. Date of Service: 10/25/2017 1:45 PM Medical Record Number: [id number] Patient Account Number: [id number] Date of Birth/Sex: 05/07/1961 (57 y.o. Male) Treating RN: Roger Shelter Primary Care Johannes Everage: [name] Other Clinician: Referring Avry Monteleone: [name] Treating Paul Torpey/Extender: Melburn Hake, HOYT Weeks in Treatment:  2 Active Problems Location of Pain Severity and Description of Pain Patient Has Paino No Site Locations Pain Management and Medication Current Pain Management: Electronic Signature(s) Signed: 10/25/2017 2:51:30 PM By: Roger Shelter Entered By: Roger Shelter on 10/25/2017 13:05:18 [name], [name] ([id number]) -------------------------------------------------------------------------------- Patient/Caregiver Education Details Patient Name: [name], [name]. Date of Service: 10/25/2017 1:45 PM Medical Record Number: [id number] Patient Account Number: [id number] Date of Birth/Gender: 1960-12-08 (57 y.o. Male) Treating RN: Roger Shelter Primary Care Physician: [name] Other Clinician: Referring Physician: [name] Treating Physician/Extender: Sharalyn Ink in Treatment: 2 Education Assessment Education Provided To: Patient Education Topics Provided Wound/Skin Impairment: Handouts: Caring for Your Ulcer Methods: Explain/Verbal Responses: State content correctly Electronic Signature(s) Signed: 10/25/2017 2:51:30 PM By: Roger Shelter Entered By: Roger Shelter on 10/25/2017 13:29:51 [name], [name]. ([id number]) -------------------------------------------------------------------------------- Wound Assessment Details Patient Name: [name], Terel Brady. Date of Service: 10/25/2017 1:45 PM Medical Record Number: [id number] Patient Account Number: [id number] Date of Birth/Sex: 06-Dec-1960 (57 y.o. Male) Treating RN: Roger Shelter Primary Care Zeba Luby: [name] Other Clinician: Referring Elonzo Sopp: [name] Treating Grier Czerwinski/Extender: Melburn Hake, HOYT Weeks in Treatment: 2 Wound Status Wound Number: 1 Primary Pressure Ulcer Etiology: Wound Location: Sacrum - Midline Wound Open Wounding Event: Pressure Injury Status: Date Acquired: 09/05/2017 Comorbid Hypertension, Peripheral Arterial Disease, Weeks Of Treatment: 2 History: Crohnos, Type II  Diabetes Clustered Wound: No Photos Photo  Uploaded By: Roger Shelter on 10/25/2017 14:51:12 Wound Measurements Length: (cm) 1.4 Width: (cm) 0.6 Depth: (cm) 0.1 Area: (cm) 0.66 Volume: (cm) 0.066 % Reduction in Area: 69.1% % Reduction in Volume: 69.2% Epithelialization: None Tunneling: No Undermining: No Wound Description Classification: Category/Stage III Wound Margin: Flat and Intact Exudate Amount: Medium Exudate Type: Serous Exudate Color: amber Foul Odor After Cleansing: No Slough/Fibrino Yes Wound Bed Granulation Amount: Medium (34-66%) Exposed Structure Granulation Quality: Red Fascia Exposed: No Necrotic Amount: Medium (34-66%) Fat Layer (Subcutaneous Tissue) Exposed: Yes Necrotic Quality: Adherent Slough Tendon Exposed: No Muscle Exposed: No Joint Exposed: No Bone Exposed: No Periwound Skin Texture Muller, Hayven Brady. ([id number]) Texture Color No Abnormalities Noted: No No Abnormalities Noted: No Callus: No Atrophie Blanche: No Crepitus: No Cyanosis: No Excoriation: No Ecchymosis: No Induration: No Erythema: No Rash: No Hemosiderin Staining: No Scarring: Yes Mottled: No Pallor: No Moisture Rubor: No No Abnormalities Noted: No Dry / Scaly: No Temperature / Pain Maceration: No Temperature: No Abnormality Tenderness on Palpation: Yes Wound Preparation Ulcer Cleansing: Rinsed/Irrigated with Saline Topical Anesthetic Applied: Other: lidocaine 4%, Treatment Notes Wound #1 (Midline Sacrum) 1. Cleansed with: Clean wound with Normal Saline 2. Anesthetic Topical Lidocaine 4% cream to wound bed prior to debridement 4. Dressing Applied: Prisma Ag 5. Secondary Dressing Applied Bordered Foam Dressing Electronic Signature(s) Signed: 10/25/2017 2:51:30 PM By: Roger Shelter Entered By: Roger Shelter on 10/25/2017 13:11:10 Runyon, [name] ([id number]) -------------------------------------------------------------------------------- Vitals  Details Patient Name: Barthold, Riyan Brady. Date of Service: 10/25/2017 1:45 PM Medical Record Number: [id number] Patient Account Number: [id number] Date of Birth/Sex: 12-22-1960 (57 y.o. Male) Treating RN: Roger Shelter Primary Care Shinika Estelle: [name] Other Clinician: Referring Abdulaziz Toman: [name] Treating Augusta Mirkin/Extender: Melburn Hake, HOYT Weeks in Treatment: 2 Vital Signs Time Taken: 01:05 Temperature (F): 97.9 Height (in): 71 Pulse (bpm): 66 Weight (lbs): 178 Respiratory Rate (breaths/min): 18 Body Mass Index (BMI): 24.8 Blood Pressure (mmHg): 142/69 Reference Range: 80 - 120 mg / dl Electronic Signature(s) Signed: 10/25/2017 2:51:30 PM By: Roger Shelter Entered By: Roger Shelter on 10/25/2017 13:07:43

## 2017-10-27 NOTE — Progress Notes (Signed)
[name], [name] ([id number]) Visit Report for 10/25/2017 Chief Complaint Document Details Patient Name: [name], [name] Date of Service: 10/25/2017 1:45 PM Medical Record Number: [id number] Patient Account Number: [id number] Date of Birth/Sex: 07/26/1961 (57 y.o. Male) Treating RN: Roger Shelter Primary Care Provider: [name] Other Clinician: Referring Provider: [name] Treating Provider/Extender: Melburn Hake, HOYT Weeks in Treatment: 2 Information Obtained from: Patient Chief Complaint He is here for evaluation of sacral pressure ulcer Electronic Signature(s) Signed: 10/26/2017 8:32:41 AM By: Worthy Keeler PA-C Entered By: Worthy Keeler on 10/25/2017 13:15:06 [name], [name] ([id number]) -------------------------------------------------------------------------------- Debridement Details Patient Name: [name], [name] Date of Service: 10/25/2017 1:45 PM Medical Record Number: [id number] Patient Account Number: [id number] Date of Birth/Sex: [birth year] (57 y.o. Male) Treating RN: Roger Shelter Primary Care Provider: [name] Other Clinician: Referring Provider: [name] Treating Provider/Extender: Melburn Hake, HOYT Weeks in Treatment: 2 Debridement Performed for Wound #1 Midline Sacrum Assessment: Performed By: Physician STONE III, HOYT E., PA-C Debridement: Debridement Pre-procedure Verification/Time Yes - 01:18 Out Taken: Start Time: 01:18 Pain Control: Other : lidocaine 4% Level: Skin/Subcutaneous Tissue Total Area Debrided (L x W): 1.4 (cm) x 0.6 (cm) = 0.84 (cm) Tissue and other material Viable, Non-Viable, Fat, Fibrin/Slough, Subcutaneous debrided: Instrument: Curette Bleeding: Minimum Hemostasis Achieved: Pressure End Time: 01:20 Procedural Pain: 0 Post Procedural Pain: 0 Response to Treatment: Procedure was tolerated well Post Debridement Measurements of Total Wound Length: (cm) 1.4 Stage: Category/Stage III Width: (cm)  0.6 Depth: (cm) 0.1 Volume: (cm) 0.066 Character of Wound/Ulcer Post Stable Debridement: Post Procedure Diagnosis Same as Pre-procedure Electronic Signature(s) Signed: 10/25/2017 2:51:30 PM By: Roger Shelter Signed: 10/26/2017 8:32:41 AM By: Worthy Keeler PA-C Entered By: Roger Shelter on 10/25/2017 13:20:36 [name], [name] ([id number]) -------------------------------------------------------------------------------- HPI Details Patient Name: [name], [name] Date of Service: 10/25/2017 1:45 PM Medical Record Number: [id number] Patient Account Number: [id number] Date of Birth/Sex: [birth year] (57 y.o. Male) Treating RN: Roger Shelter Primary Care Provider: [name] Other Clinician: Referring Provider: [name] Treating Provider/Extender: Melburn Hake, HOYT Weeks in Treatment: 2 History of Present Illness Location: Patient has a ulcer to Sacrum Quality: denies pain Severity: denies pain Duration: months, but unable to clearly articulate timeframe Context: ulcer occurred secondary to pressure HPI Description: 10-06-17 he is here for initial evaluation of the sacral ulcer. He is unable to articulate exactly this is a states "months". 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 He is currently in a facility, [facility]. He is unsure if he has a pressure reduction mattress. He admits to sitting the majority of the day and sleeping on his back. We discussed the need for lateral sleeping, which he states he is able to do. He voices no complaints of pain, discomfort, fever, chills, ill feeling. He tolerated debridement. 10/26/17 on evaluation today patient sacral wound appears to be doing excellent. He has been tolerating the dressing changes without complication. He does have some discomfort especially when he has to sit in the chair at dialysis for an extended period of time. With that being said other than that he tries  to stay offloaded as much as possible and I think he has done a very good job in that regard. There does not appear to be any new pressure injury. Electronic Signature(s) Signed: 10/26/2017 8:32:41 AM By: Worthy Keeler PA-C Entered By: Worthy Keeler on 10/25/2017 16:52:58 [name], [name] ([id number]) -------------------------------------------------------------------------------- Physical Exam Details Patient  Name: [name], [name] Date of Service: 10/25/2017 1:45 PM Medical Record Number: [id number] Patient Account Number: [id number] Date of Birth/Sex: [birth year] (57 y.o. Male) Treating RN: Roger Shelter Primary Care Provider: [name] Other Clinician: Referring Provider: [name] Treating Provider/Extender: Melburn Hake, HOYT Weeks in Treatment: 2 Constitutional Well-nourished and well-hydrated in no acute distress. Respiratory normal breathing without difficulty. Psychiatric this patient is able to make decisions and demonstrates good insight into disease process. Alert and Oriented x 3. pleasant and cooperative. Notes Patient's wound bed does show some Slough covering at this time. Fortunately there does not appear to be any infection note purulent discharge and there is a significant amount of epithelialization even more upon closer inspection than we initially thought. Obviously this is excellent news. Patient did tolerate sharp debridement today to clean off the slough noted without any complication. Electronic Signature(s) Signed: 10/26/2017 8:32:41 AM By: Worthy Keeler PA-C Entered By: Worthy Keeler on 10/25/2017 16:53:56 [name], [name] ([id number]) -------------------------------------------------------------------------------- Physician Orders Details Patient Name: [name], [name] Date of Service: 10/25/2017 1:45 PM Medical Record Number: [id number] Patient Account Number: [id number] Date of Birth/Sex: [birth year] (57 y.o. Male) Treating RN: Roger Shelter Primary Care Provider: [name] Other Clinician: Referring Provider: [name] Treating Provider/Extender: Melburn Hake, HOYT Weeks in Treatment: 2 Verbal / Phone Orders: No Diagnosis Coding ICD-10 Coding Code Description L89.153 Pressure ulcer of sacral region, stage 3 Z89.511 Acquired absence of right leg below knee N18.6 End stage renal disease E11.622 Type 2 diabetes mellitus with other skin ulcer Wound Cleansing Wound #1 Midline Sacrum o Clean wound with Normal Saline. o May Shower, gently pat wound dry prior to applying new dressing. Anesthetic (add to Medication List) Wound #1 Midline Sacrum o Topical Lidocaine 4% cream applied to wound bed prior to debridement (In Clinic Only). Primary Wound Dressing Wound #1 Midline Sacrum o Prisma Ag - or collagen with silver equivalent Secondary Dressing Wound #1 Midline Sacrum o Dry Gauze o Boardered Foam Dressing Dressing Change Frequency Wound #1 Midline Sacrum o Change dressing every day. Follow-up Appointments o Return Appointment in 2 weeks. Electronic Signature(s) Signed: 10/25/2017 2:51:30 PM By: Roger Shelter Signed: 10/26/2017 8:32:41 AM By: Worthy Keeler PA-C Entered By: Roger Shelter on 10/25/2017 13:27:40 Preece, [name] ([id number]) -------------------------------------------------------------------------------- Problem List Details Patient Name: Miers, Oather B. Date of Service: 10/25/2017 1:45 PM Medical Record Number: [id number] Patient Account Number: [id number] Date of Birth/Sex: 12/18/1960 (57 y.o. Male) Treating RN: Roger Shelter Primary Care Provider: [name] Other Clinician: Referring Provider: [name] Treating Provider/Extender: Melburn Hake, HOYT Weeks in Treatment: 2 Active Problems ICD-10 Encounter Code Description Active Date Diagnosis L89.153 Pressure ulcer of sacral region, stage 3 10/06/2017 Yes Z89.511 Acquired absence of right leg below  knee 10/06/2017 Yes N18.6 End stage renal disease 10/06/2017 Yes E11.622 Type 2 diabetes mellitus with other skin ulcer 10/06/2017 Yes Inactive Problems Resolved Problems Electronic Signature(s) Signed: 10/26/2017 8:32:41 AM By: Worthy Keeler PA-C Entered By: Worthy Keeler on 10/25/2017 13:14:57 Sargeant, [name] ([id number]) -------------------------------------------------------------------------------- Progress Note Details Patient Name: [name], [name] Date of Service: 10/25/2017 1:45 PM Medical Record Number: [id number] Patient Account Number: [id number] Date of Birth/Sex: 1961/07/01 (58 y.o. Male) Treating RN: Roger Shelter Primary Care Provider: [name] Other Clinician: Referring Provider: [name] Treating Provider/Extender: Melburn Hake, HOYT Weeks in Treatment: 2 Subjective Chief Complaint Information obtained from Patient He is here for evaluation of sacral pressure ulcer History of Present Illness (HPI) The following HPI elements were  documented for the patient's wound: Location: Patient has a ulcer to Sacrum Quality: denies pain Severity: denies pain Duration: months, but unable to clearly articulate timeframe Context: ulcer occurred secondary to pressure 10-06-17 he is here for initial evaluation of the sacral ulcer. He is unable to articulate exactly this is a states "months". 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 He is currently in a facility, [facility]. He is unsure if he has a pressure reduction mattress. He admits to sitting the majority of the day and sleeping on his back. We discussed the need for lateral sleeping, which he states he is able to do. He voices no complaints of pain, discomfort, fever, chills, ill feeling. He tolerated debridement. 10/26/17 on evaluation today patient sacral wound appears to be doing excellent. He has been tolerating the dressing changes without complication.  He does have some discomfort especially when he has to sit in the chair at dialysis for an extended period of time. With that being said other than that he tries to stay offloaded as much as possible and I think he has done a very good job in that regard. There does not appear to be any new pressure injury. Patient History Information obtained from Patient. Family History Diabetes - Mother,Siblings, No family history of Cancer, Heart Disease, Hereditary Spherocytosis, Hypertension, Kidney Disease, Lung Disease, Seizures, Stroke, Thyroid Problems, Tuberculosis. Social History Former smoker - quit 20+ years ago, Marital Status - Married, Alcohol Use - Never, Drug Use - No History, Caffeine Use - Daily. Medical And Surgical History Notes Genitourinary renal disorder - CKD Neurologic CVA - 3 months ago Review of Systems (ROS) Constitutional Symptoms (General Health) Denies complaints or symptoms of Fever, Chills. Respiratory Kotecki, Bernd B. ([id number]) The patient has no complaints or symptoms. Cardiovascular The patient has no complaints or symptoms. Psychiatric The patient has no complaints or symptoms. Objective Constitutional Well-nourished and well-hydrated in no acute distress. Vitals Time Taken: 1:05 AM, Height: 71 in, Weight: 178 lbs, BMI: 24.8, Temperature: 97.9 F, Pulse: 66 bpm, Respiratory Rate: 18 breaths/min, Blood Pressure: 142/69 mmHg. Respiratory normal breathing without difficulty. Psychiatric this patient is able to make decisions and demonstrates good insight into disease process. Alert and Oriented x 3. pleasant and cooperative. General Notes: Patient's wound bed does show some Slough covering at this time. Fortunately there does not appear to be any infection note purulent discharge and there is a significant amount of epithelialization even more upon closer inspection than we initially thought. Obviously this is excellent news. Patient did tolerate sharp  debridement today to clean off the slough noted without any complication. Integumentary (Hair, Skin) Wound #1 status is Open. Original cause of wound was Pressure Injury. The wound is located on the Midline Sacrum. The wound measures 1.4cm length x 0.6cm width x 0.1cm depth; 0.66cm^2 area and 0.066cm^3 volume. There is Fat Layer (Subcutaneous Tissue) Exposed exposed. There is no tunneling or undermining noted. There is a medium amount of serous drainage noted. The wound margin is flat and intact. There is medium (34-66%) red granulation within the wound bed. There is a medium (34-66%) amount of necrotic tissue within the wound bed including Adherent Slough. The periwound skin appearance exhibited: Scarring. The periwound skin appearance did not exhibit: Callus, Crepitus, Excoriation, Induration, Rash, Dry/Scaly, Maceration, Atrophie Blanche, Cyanosis, Ecchymosis, Hemosiderin Staining, Mottled, Pallor, Rubor, Erythema. Periwound temperature was noted as No Abnormality. The periwound has tenderness on palpation. Assessment Active  Problems ICD-10 L89.153 - Pressure ulcer of sacral region, stage 3 Z89.511 - Acquired absence of right leg below knee N18.6 - End stage renal disease E11.622 - Type 2 diabetes mellitus with other skin ulcer [name], [name] ([id number]) Procedures Wound #1 Pre-procedure diagnosis of Wound #1 is a Pressure Ulcer located on the Midline Sacrum . There was a Skin/Subcutaneous Tissue Debridement ([id number]) debridement with total area of 0.84 sq cm performed by STONE III, HOYT E., PA-C. with the following instrument(s): Curette to remove Viable and Non-Viable tissue/material including Fat Layer (and Subcutaneous Tissue) Exposed, Fibrin/Slough, and Subcutaneous after achieving pain control using Other (lidocaine 4%). A time out was conducted at 01:18, prior to the start of the procedure. A Minimum amount of bleeding was controlled with Pressure. The procedure was  tolerated well with a pain level of 0 throughout and a pain level of 0 following the procedure. Post Debridement Measurements: 1.4cm length x 0.6cm width x 0.1cm depth; 0.066cm^3 volume. Post debridement Stage noted as Category/Stage III. Character of Wound/Ulcer Post Debridement is stable. Post procedure Diagnosis Wound #1: Same as Pre-Procedure Plan Wound Cleansing: Wound #1 Midline Sacrum: Clean wound with Normal Saline. May Shower, gently pat wound dry prior to applying new dressing. Anesthetic (add to Medication List): Wound #1 Midline Sacrum: Topical Lidocaine 4% cream applied to wound bed prior to debridement (In Clinic Only). Primary Wound Dressing: Wound #1 Midline Sacrum: Prisma Ag - or collagen with silver equivalent Secondary Dressing: Wound #1 Midline Sacrum: Dry Gauze Boardered Foam Dressing Dressing Change Frequency: Wound #1 Midline Sacrum: Change dressing every day. Follow-up Appointments: Return Appointment in 2 weeks. I'm gonna recommend that we continue at this point with the current wound care orders which is the silver collagen dressing. Patient is in agreement with this plan and we will see him for reevaluation in two weeks. Please see above for specific wound care orders. We will see patient for re-evaluation in 1 week(s) here in the clinic. If anything worsens or changes patient will contact our office for additional recommendations. [name], [name] ([id number]) Electronic Signature(s) Signed: 10/26/2017 8:32:41 AM By: Worthy Keeler PA-C Entered By: Worthy Keeler on 10/25/2017 16:54:35 [name], [name] ([id number]) -------------------------------------------------------------------------------- ROS/PFSH Details Patient Name: Gilberto, Timouthy B. Date of Service: 10/25/2017 1:45 PM Medical Record Number: [id number] Patient Account Number: [id number] Date of Birth/Sex: 03-20-1961 (57 y.o. Male) Treating RN: Roger Shelter Primary Care Provider: [name] Other Clinician: Referring Provider: [name] Treating Provider/Extender: Melburn Hake, HOYT Weeks in Treatment: 2 Information Obtained From Patient Wound History Do you currently have one or more open woundso Yes How many open wounds do you currently haveo 1 Approximately how long have you had your woundso about 2 months How have you been treating your wound(s) until nowo santyl Has your wound(s) ever healed and then re-openedo No Have you had any lab work done in the past montho No Have you tested positive for an antibiotic resistant organism (MRSA, VRE)o No Have you tested positive for osteomyelitis (bone infection)o No Have you had any tests for circulation on your legso Yes Who ordered the testo PCP Where was the test doneo [facility] Constitutional Symptoms (General Health) Complaints and Symptoms: Negative for: Fever; Chills Eyes Medical History: Negative for: Cataracts; Glaucoma; Optic Neuritis Hematologic/Lymphatic Medical History: Negative for: Anemia; Hemophilia; Human Immunodeficiency Virus; Lymphedema; Sickle Cell Disease Respiratory Complaints and Symptoms: No Complaints or Symptoms Medical History: Negative for: Aspiration; Asthma; Chronic Obstructive Pulmonary Disease (COPD); Pneumothorax; Sleep Apnea; Tuberculosis Cardiovascular  Complaints and Symptoms: No Complaints or Symptoms Medical History: Positive for: Hypertension; Peripheral Arterial Disease Negative for: Angina; Arrhythmia; Congestive Heart Failure; Coronary Artery Disease; Deep Vein Thrombosis; Hypotension; Myocardial Infarction; Peripheral Venous Disease; Phlebitis; Vasculitis [name], [name] ([id number]) Gastrointestinal Medical History: Positive for: Crohnos Negative for: Cirrhosis ; Colitis; Hepatitis A; Hepatitis B; Hepatitis C Endocrine Medical History: Positive for: Type II Diabetes Time with diabetes: 20+ years Treated with: Insulin Blood sugar tested every day: Yes Tested  : Genitourinary Medical History: Past Medical History Notes: renal disorder - CKD Immunological Medical History: Negative for: Lupus Erythematosus; Raynaudos; Scleroderma Musculoskeletal Medical History: Negative for: Gout; Rheumatoid Arthritis; Osteoarthritis; Osteomyelitis Neurologic Medical History: Negative for: Dementia; Neuropathy; Quadriplegia; Paraplegia; Seizure Disorder Past Medical History Notes: CVA - 3 months ago Oncologic Medical History: Negative for: Received Chemotherapy; Received Radiation Psychiatric Complaints and Symptoms: No Complaints or Symptoms Immunizations Pneumococcal Vaccine: Received Pneumococcal Vaccination: Yes Implantable Devices Family and Social History Cancer: No; Diabetes: Yes - Mother,Siblings; Heart Disease: No; Hereditary Spherocytosis: No; Hypertension: No; Kidney Disease: No; Lung Disease: No; Seizures: No; Stroke: No; Thyroid Problems: No; Tuberculosis: No; Former smoker - quit 20+ years ago; Marital Status - Married; Alcohol Use: Never; Drug Use: No History; Caffeine Use: Daily; Financial Concerns: No; Food, Clothing or Shelter Needs: No; Support System Lacking: No; Transportation Concerns: No; Advanced Directives: No; Patient does not want information on [name], KORTLAND NICHOLS. ([id number]) Physician Affirmation I have reviewed and agree with the above information. Electronic Signature(s) Signed: 10/26/2017 8:32:41 AM By: Worthy Keeler PA-C Signed: 10/26/2017 3:18:20 PM By: Roger Shelter Entered By: Worthy Keeler on 10/25/2017 16:53:20 Glahn, [name] ([id number]) -------------------------------------------------------------------------------- SuperBill Details Patient Name: Taulbee, Bralen B. Date of Service: 10/25/2017 Medical Record Number: [id number] Patient Account Number: [id number] Date of Birth/Sex: 09-Sep-1961 (57 y.o. Male) Treating RN: Roger Shelter Primary Care Provider: [name] Other  Clinician: Referring Provider: [name] Treating Provider/Extender: Melburn Hake, HOYT Weeks in Treatment: 2 Diagnosis Coding ICD-10 Codes Code Description L89.153 Pressure ulcer of sacral region, stage 3 Z89.511 Acquired absence of right leg below knee N18.6 End stage renal disease E11.622 Type 2 diabetes mellitus with other skin ulcer Facility Procedures CPT4 Code: [id number] Description: 08811 - DEB SUBQ TISSUE 20 SQ CM/< ICD-10 Diagnosis Description L89.153 Pressure ulcer of sacral region, stage 3 Modifier: Quantity: 1 Physician Procedures CPT4 Code: [id number] Description: 85929 - WC PHYS SUBQ TISS 20 SQ CM ICD-10 Diagnosis Description L89.153 Pressure ulcer of sacral region, stage 3 Modifier: Quantity: 1 Electronic Signature(s) Signed: 10/26/2017 8:32:41 AM By: Worthy Keeler PA-C Entered By: Worthy Keeler on 10/25/2017 16:54:45

## 2017-10-28 ENCOUNTER — Telehealth: Payer: Self-pay | Admitting: Family Medicine

## 2017-10-28 NOTE — Telephone Encounter (Signed)
OK 

## 2017-10-28 NOTE — Telephone Encounter (Signed)
lmtcb-Anastasiya Estell Harpin, RMA

## 2017-10-28 NOTE — Telephone Encounter (Signed)
Colletta Maryland from [facility] called and needs verbal orders for occupational therapy  1 time a week for 1 week 2 times a week for 4 weeks 1 times a week for 1 week  CB# [phone]

## 2017-10-31 NOTE — Telephone Encounter (Signed)
Gave verbal ok-per Dr.[name] as documented below to Roosevelt from [facility].  Thanks,  -Joseline

## 2017-10-31 NOTE — Telephone Encounter (Signed)
[name] from [facility] is calling back and requesting a call back.  CB# [phone]

## 2017-11-04 ENCOUNTER — Telehealth: Payer: Self-pay | Admitting: Family Medicine

## 2017-11-04 NOTE — Telephone Encounter (Signed)
Please review. Thanks!  

## 2017-11-04 NOTE — Telephone Encounter (Signed)
Pt had a missed physical therapy home visit today because of OT patient visit this morning and dialysis.  Sela Hua with [facility] - [phone]

## 2017-11-08 DIAGNOSIS — I120 Hypertensive chronic kidney disease with stage 5 chronic kidney disease or end stage renal disease: Secondary | ICD-10-CM | POA: Diagnosis not present

## 2017-11-08 DIAGNOSIS — Z4781 Encounter for orthopedic aftercare following surgical amputation: Secondary | ICD-10-CM | POA: Diagnosis not present

## 2017-11-08 DIAGNOSIS — N186 End stage renal disease: Secondary | ICD-10-CM | POA: Diagnosis not present

## 2017-11-08 DIAGNOSIS — E1122 Type 2 diabetes mellitus with diabetic chronic kidney disease: Secondary | ICD-10-CM | POA: Diagnosis not present

## 2017-11-10 ENCOUNTER — Encounter: Payer: BLUE CROSS/BLUE SHIELD | Admitting: Nurse Practitioner

## 2017-11-10 DIAGNOSIS — E11622 Type 2 diabetes mellitus with other skin ulcer: Secondary | ICD-10-CM | POA: Diagnosis not present

## 2017-11-11 NOTE — Progress Notes (Signed)
AHIJAH, DEVERY ([id number]) Visit Report for 11/10/2017 Chief Complaint Document Details Patient Name: [name], [name] Date of Service: 11/10/2017 10:15 AM Medical Record Number: [id number] Patient Account Number: [id number] Date of Birth/Sex: [birth year] (57 y.o. Male) Treating RN: Montey Hora Primary Care Provider: [name] Other Clinician: Referring Provider: [name] Treating Provider/Extender: Cathie Olden in Treatment: 5 Information Obtained from: Patient Chief Complaint He is here for evaluation of sacral pressure ulcer Electronic Signature(s) Signed: 11/10/2017 4:11:02 PM By: Lawanda Cousins Entered By: Lawanda Cousins on 11/10/2017 11:39:26 Fendrick, Toribio Harbour ([id number]) -------------------------------------------------------------------------------- Debridement Details Patient Name: [name], [name] Date of Service: 11/10/2017 10:15 AM Medical Record Number: [id number] Patient Account Number: [id number] Date of Birth/Sex: Jan 25, 1961 (58 y.o. Male) Treating RN: Montey Hora Primary Care Provider: [name] Other Clinician: Referring Provider: [name] Treating Provider/Extender: Cathie Olden in Treatment: 5 Debridement Performed for Wound #1 Midline Sacrum Assessment: Performed By: Physician Lawanda Cousins, NP Debridement: Debridement Pre-procedure Verification/Time Yes - 11:03 Out Taken: Start Time: 11:04 Pain Control: Other : lidocaine 4% Level: Skin/Subcutaneous Tissue Total Area Debrided (L x W): 1.6 (cm) x 0.8 (cm) = 1.28 (cm) Tissue and other material Viable, Non-Viable, Fibrin/Slough, Subcutaneous debrided: Instrument: Curette Bleeding: Minimum Hemostasis Achieved: Pressure End Time: 11:05 Procedural Pain: 0 Post Procedural Pain: 0 Response to Treatment: Procedure was tolerated well Post Debridement Measurements of Total Wound Length: (cm) 1.6 Stage: Category/Stage III Width: (cm) 0.8 Depth: (cm) 0.1 Volume:  (cm) 0.101 Character of Wound/Ulcer Post Stable Debridement: Post Procedure Diagnosis Same as Pre-procedure Electronic Signature(s) Signed: 11/10/2017 3:59:18 PM By: Montey Hora Signed: 11/10/2017 4:11:02 PM By: Lawanda Cousins Entered By: Lawanda Cousins on 11/10/2017 11:39:19 Haworth, Day B. ([id number]) -------------------------------------------------------------------------------- HPI Details Patient Name: [name], [name] Date of Service: 11/10/2017 10:15 AM Medical Record Number: [id number] Patient Account Number: [id number] Date of Birth/Sex: November 02, 1960 (57 y.o. Male) Treating RN: Montey Hora Primary Care Provider: [name] Other Clinician: Referring Provider: [name] Treating Provider/Extender: Cathie Olden in Treatment: 5 History of Present Illness Location: Patient has a ulcer to Sacrum Quality: denies pain Severity: denies pain Duration: months, but unable to clearly articulate timeframe Context: ulcer occurred secondary to pressure HPI Description: 10-06-17 he is here for initial evaluation of the sacral ulcer. He is unable to articulate exactly this is a states "months". 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 He is currently in a facility, [facility]. He is unsure if he has a pressure reduction mattress. He admits to sitting the majority of the day and sleeping on his back. We discussed the need for lateral sleeping, which he states he is able to do. He voices no complaints of pain, discomfort, fever, chills, ill feeling. He tolerated debridement. 10/26/17 on evaluation today patient sacral wound appears to be doing excellent. He has been tolerating the dressing changes without complication. He does have some discomfort especially when he has to sit in the chair at dialysis for an extended period of time. With that being said other than that he tries to stay offloaded as much as possible and I  think he has done a very good job in that regard. There does not appear to be any new pressure injury. 11/10/17 He is here in follow up evaluation of a sacral ulcer. He is now home, have been using prisma. He presents with essentially unchanged measurements and increased in nonviable tissue. Prior to prisma they were using santyl, will  transition back to santyl (spouse to call clinic if they do not have santyl at home). We discussed more aggressive offloading as he continues to sleep on his back, improved diet as he tends to eat "junk food". He does use a pressure redistribution cushion while in dialysis. Follow up in two weeks per their preference. Electronic Signature(s) Signed: 11/10/2017 4:11:02 PM By: Lawanda Cousins Entered By: Lawanda Cousins on 11/10/2017 11:42:38 Hewson, Toribio Harbour ([id number]) -------------------------------------------------------------------------------- Physician Orders Details Patient Name: Neubecker, Tyller B. Date of Service: 11/10/2017 10:15 AM Medical Record Number: [id number] Patient Account Number: [id number] Date of Birth/Sex: [birth year] (57 y.o. Male) Treating RN: [name] Primary Care Provider: [name] Other Clinician: Referring Provider: [name] Treating Provider/Extender: Cathie Olden in Treatment: 5 Verbal / Phone Orders: No Diagnosis Coding Wound Cleansing Wound #1 Midline Sacrum o Clean wound with Normal Saline. o May Shower, gently pat wound dry prior to applying new dressing. Anesthetic (add to Medication List) Wound #1 Midline Sacrum o Topical Lidocaine 4% cream applied to wound bed prior to debridement (In Clinic Only). Skin Barriers/Peri-Wound Care Wound #1 Midline Sacrum o Skin Prep Primary Wound Dressing Wound #1 Midline Sacrum o Santyl Ointment Secondary Dressing Wound #1 Midline Sacrum o Dry Gauze o Boardered Foam Dressing Dressing Change Frequency Wound #1 Midline Sacrum o Change dressing every  day. Follow-up Appointments o Return Appointment in 2 weeks. Off-Loading Wound #1 Midline Sacrum o Roho cushion for wheelchair o Mattress o Turn and reposition every 2 hours Additional Orders / Instructions Wound #1 Midline Sacrum o Increase protein intake. Medications-please add to medication list. Wound #1 Midline Sacrum Falco, Jeoffrey B. ([id number]) o Santyl Enzymatic Ointment Patient Medications Allergies: adhesive, cefepime, methotrexate, silicon, vancomycin Notifications Medication Indication Start End lidocaine DOSE topical 4 % cream - cream topical Electronic Signature(s) Signed: 11/10/2017 4:11:02 PM By: Lawanda Cousins Entered By: Lawanda Cousins on 11/10/2017 11:43:01 Bonnette, Toribio Harbour ([id number]) -------------------------------------------------------------------------------- Problem List Details Patient Name: [name], [name] Date of Service: 11/10/2017 10:15 AM Medical Record Number: [id number] Patient Account Number: [id number] Date of Birth/Sex: 09-18-1961 (57 y.o. Male) Treating RN: Montey Hora Primary Care Provider: [name] Other Clinician: Referring Provider: [name] Treating Provider/Extender: Lawanda Cousins Weeks in Treatment: 5 Active Problems ICD-10 Encounter Code Description Active Date Diagnosis L89.153 Pressure ulcer of sacral region, stage 3 10/06/2017 Yes Z89.511 Acquired absence of right leg below knee 10/06/2017 Yes N18.6 End stage renal disease 10/06/2017 Yes E11.622 Type 2 diabetes mellitus with other skin ulcer 10/06/2017 Yes Inactive Problems Resolved Problems Electronic Signature(s) Signed: 11/10/2017 4:11:02 PM By: Lawanda Cousins Entered By: Lawanda Cousins on 11/10/2017 11:39:05 Violante, Toribio Harbour ([id number]) -------------------------------------------------------------------------------- Progress Note Details Patient Name: [name], [name] Date of Service: 11/10/2017 10:15 AM Medical Record Number:  [id number] Patient Account Number: [id number] Date of Birth/Sex: 1960/11/20 (57 y.o. Male) Treating RN: Montey Hora Primary Care Provider: [name] Other Clinician: Referring Provider: [name] Treating Provider/Extender: Cathie Olden in Treatment: 5 Subjective Chief Complaint Information obtained from Patient He is here for evaluation of sacral pressure ulcer History of Present Illness (HPI) The following HPI elements were documented for the patient's wound: Location: Patient has a ulcer to Sacrum Quality: denies pain Severity: denies pain Duration: months, but unable to clearly articulate timeframe Context: ulcer occurred secondary to pressure 10-06-17 he is here for initial evaluation of the sacral ulcer. He is unable to articulate exactly this is a states "months". 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 He is currently in a facility, [facility]. He is unsure if he has a pressure reduction mattress. He admits to sitting the majority of the day and sleeping on his back. We discussed the need for lateral sleeping, which he states he is able to do. He voices no complaints of pain, discomfort, fever, chills, ill feeling. He tolerated debridement. 10/26/17 on evaluation today patient sacral wound appears to be doing excellent. He has been tolerating the dressing changes without complication. He does have some discomfort especially when he has to sit in the chair at dialysis for an extended period of time. With that being said other than that he tries to stay offloaded as much as possible and I think he has done a very good job in that regard. There does not appear to be any new pressure injury. 11/10/17 He is here in follow up evaluation of a sacral ulcer. He is now home, have been using prisma. He presents with essentially unchanged measurements and increased in nonviable tissue. Prior to prisma they were using  santyl, will transition back to santyl (spouse to call clinic if they do not have santyl at home). We discussed more aggressive offloading as he continues to sleep on his back, improved diet as he tends to eat "junk food". He does use a pressure redistribution cushion while in dialysis. Follow up in two weeks per their preference. Patient History Information obtained from Patient. Family History Diabetes - Mother,Siblings, No family history of Cancer, Heart Disease, Hereditary Spherocytosis, Hypertension, Kidney Disease, Lung Disease, Seizures, Stroke, Thyroid Problems, Tuberculosis. Social History Former smoker - quit 20+ years ago, Marital Status - Married, Alcohol Use - Never, Drug Use - No History, Caffeine Use - Daily. Medical And Surgical History Notes Genitourinary renal disorder - CKD Neurologic Jamie, Yousuf B. ([id number]) CVA - 3 months ago Objective Constitutional Vitals Time Taken: 10:43 AM, Height: 71 in, Weight: 178 lbs, BMI: 24.8, Temperature: 98.7 F, Pulse: 68 bpm, Respiratory Rate: 16 breaths/min, Blood Pressure: 120/62 mmHg. Integumentary (Hair, Skin) Wound #1 status is Open. Original cause of wound was Pressure Injury. The wound is located on the Midline Sacrum. The wound measures 1.6cm length x 0.8cm width x 0.1cm depth; 1.005cm^2 area and 0.101cm^3 volume. There is Fat Layer (Subcutaneous Tissue) Exposed exposed. There is no tunneling or undermining noted. There is a medium amount of serous drainage noted. The wound margin is flat and intact. There is no granulation within the wound bed. There is a large (67-100%) amount of necrotic tissue within the wound bed including Adherent Slough. The periwound skin appearance exhibited: Scarring, Ecchymosis. The periwound skin appearance did not exhibit: Callus, Crepitus, Excoriation, Induration, Rash, Dry/Scaly, Maceration, Atrophie Blanche, Cyanosis, Hemosiderin Staining, Mottled, Pallor, Rubor, Erythema.  Periwound temperature was noted as No Abnormality. The periwound has tenderness on palpation. Assessment Active Problems ICD-10 L89.153 - Pressure ulcer of sacral region, stage 3 Z89.511 - Acquired absence of right leg below knee N18.6 - End stage renal disease E11.622 - Type 2 diabetes mellitus with other skin ulcer Procedures Wound #1 Pre-procedure diagnosis of Wound #1 is a Pressure Ulcer located on the Midline Sacrum . There was a Skin/Subcutaneous Tissue Debridement ([id number]) debridement with total area of 1.28 sq cm performed by Lawanda Cousins, NP. with the following instrument(s): Curette to remove Viable and Non-Viable tissue/material including Fibrin/Slough and Subcutaneous after achieving pain control using Other (lidocaine 4%). A time out was conducted at 11:03, prior to the start of the procedure.  A Minimum amount of bleeding was controlled with Pressure. The procedure was tolerated well with a pain level of 0 throughout and a pain level of 0 following the procedure. Post Debridement Measurements: 1.6cm length x 0.8cm width x 0.1cm depth; 0.101cm^3 volume. Post debridement Stage noted as Category/Stage III. Character of Wound/Ulcer Post Debridement is stable. Nifong, Josiah B. ([id number]) Post procedure Diagnosis Wound #1: Same as Pre-Procedure Plan Wound Cleansing: Wound #1 Midline Sacrum: Clean wound with Normal Saline. May Shower, gently pat wound dry prior to applying new dressing. Anesthetic (add to Medication List): Wound #1 Midline Sacrum: Topical Lidocaine 4% cream applied to wound bed prior to debridement (In Clinic Only). Skin Barriers/Peri-Wound Care: Wound #1 Midline Sacrum: Skin Prep Primary Wound Dressing: Wound #1 Midline Sacrum: Santyl Ointment Secondary Dressing: Wound #1 Midline Sacrum: Dry Gauze Boardered Foam Dressing Dressing Change Frequency: Wound #1 Midline Sacrum: Change dressing every day. Follow-up Appointments: Return Appointment  in 2 weeks. Off-Loading: Wound #1 Midline Sacrum: Roho cushion for wheelchair Mattress Turn and reposition every 2 hours Additional Orders / Instructions: Wound #1 Midline Sacrum: Increase protein intake. Medications-please add to medication list.: Wound #1 Midline Sacrum: Santyl Enzymatic Ointment The following medication(s) was prescribed: lidocaine topical 4 % cream cream topical was prescribed at facility 1. transition back to santyl daily 2. more aggressive offloading 3. healthier diet 4. follow up in two weeks Coreas, Cadan B. ([id number]) Electronic Signature(s) Signed: 11/10/2017 4:11:02 PM By: Lawanda Cousins Entered By: Lawanda Cousins on 11/10/2017 11:43:34 Jahr, Toribio Harbour ([id number]) -------------------------------------------------------------------------------- ROS/PFSH Details Patient Name: [name], [name] Date of Service: 11/10/2017 10:15 AM Medical Record Number: [id number] Patient Account Number: [id number] Date of Birth/Sex: 07-20-1961 (56 y.o. Male) Treating RN: Montey Hora Primary Care Provider: [name] Other Clinician: Referring Provider: [name] Treating Provider/Extender: Cathie Olden in Treatment: 5 Information Obtained From Patient Wound History Do you currently have one or more open woundso Yes How many open wounds do you currently haveo 1 Approximately how long have you had your woundso about 2 months How have you been treating your wound(s) until nowo santyl Has your wound(s) ever healed and then re-openedo No Have you had any lab work done in the past montho No Have you tested positive for an antibiotic resistant organism (MRSA, VRE)o No Have you tested positive for osteomyelitis (bone infection)o No Have you had any tests for circulation on your legso Yes Who ordered the testo PCP Where was the test doneo [facility] Medical History: Negative for: Cataracts; Glaucoma; Optic Neuritis Hematologic/Lymphatic Medical  History: Negative for: Anemia; Hemophilia; Human Immunodeficiency Virus; Lymphedema; Sickle Cell Disease Respiratory Medical History: Negative for: Aspiration; Asthma; Chronic Obstructive Pulmonary Disease (COPD); Pneumothorax; Sleep Apnea; Tuberculosis Cardiovascular Medical History: Positive for: Hypertension; Peripheral Arterial Disease Negative for: Angina; Arrhythmia; Congestive Heart Failure; Coronary Artery Disease; Deep Vein Thrombosis; Hypotension; Myocardial Infarction; Peripheral Venous Disease; Phlebitis; Vasculitis Gastrointestinal Medical History: Positive for: Crohnos Negative for: Cirrhosis ; Colitis; Hepatitis A; Hepatitis B; Hepatitis C Endocrine Medical History: Positive for: Type II Diabetes [name], [name] ([id number]) Time with diabetes: 20+ years Treated with: Insulin Blood sugar tested every day: Yes Tested : Genitourinary Medical History: Past Medical History Notes: renal disorder - CKD Immunological Medical History: Negative for: Lupus Erythematosus; Raynaudos; Scleroderma Musculoskeletal Medical History: Negative for: Gout; Rheumatoid Arthritis; Osteoarthritis; Osteomyelitis Neurologic Medical History: Negative for: Dementia; Neuropathy; Quadriplegia; Paraplegia; Seizure Disorder Past Medical History Notes: CVA - 3 months ago Oncologic Medical History: Negative for: Received Chemotherapy; Received Radiation Immunizations Pneumococcal Vaccine:  Received Pneumococcal Vaccination: Yes Implantable Devices Family and Social History Cancer: No; Diabetes: Yes - Mother,Siblings; Heart Disease: No; Hereditary Spherocytosis: No; Hypertension: No; Kidney Disease: No; Lung Disease: No; Seizures: No; Stroke: No; Thyroid Problems: No; Tuberculosis: No; Former smoker - quit 20+ years ago; Marital Status - Married; Alcohol Use: Never; Drug Use: No History; Caffeine Use: Daily; Financial Concerns: No; Food, Clothing or Shelter Needs: No; Support System  Lacking: No; Transportation Concerns: No; Advanced Directives: No; Patient does not want information on Advanced Directives Physician Affirmation I have reviewed and agree with the above information. Electronic Signature(s) Signed: 11/10/2017 3:59:18 PM By: Montey Hora Signed: 11/10/2017 4:11:02 PM By: Lawanda Cousins Entered By: Lawanda Cousins on 11/10/2017 11:42:50 Nieland, Toribio Harbour ([id number]) -------------------------------------------------------------------------------- SuperBill Details Patient Name: [name], [name] Date of Service: 11/10/2017 Medical Record Number: [id number] Patient Account Number: [id number] Date of Birth/Sex: 06-02-1961 (57 y.o. Male) Treating RN: Montey Hora Primary Care Provider: [name] Other Clinician: Referring Provider: [name] Treating Provider/Extender: Lawanda Cousins Weeks in Treatment: 5 Diagnosis Coding ICD-10 Codes Code Description [phone] Pressure ulcer of sacral region, stage 3 Z89.511 Acquired absence of right leg below knee N18.6 End stage renal disease E11.622 Type 2 diabetes mellitus with other skin ulcer Facility Procedures CPT4 Code: [id number] Description: 30076 - DEB SUBQ TISSUE 20 SQ CM/< ICD-10 Diagnosis Description L89.153 Pressure ulcer of sacral region, stage 3 Z89.511 Acquired absence of right leg below knee N18.6 End stage renal disease E11.622 Type 2 diabetes mellitus with other skin  ulcer Modifier: Quantity: 1 Physician Procedures CPT4 Code: [id number] Description: 11042 - WC PHYS SUBQ TISS 20 SQ CM ICD-10 Diagnosis Description L89.153 Pressure ulcer of sacral region, stage 3 Z89.511 Acquired absence of right leg below knee N18.6 End stage renal disease E11.622 Type 2 diabetes mellitus with other skin  ulcer Modifier: Quantity: 1 Electronic Signature(s) Signed: 11/10/2017 4:11:02 PM By: Lawanda Cousins Entered By: Lawanda Cousins on 11/10/2017 11:43:45

## 2017-11-15 NOTE — Progress Notes (Signed)
RANDALE, CARVALHO ([id number]) Visit Report for 11/10/2017 Arrival Information Details Patient Name: Brady, [name] Date of Service: 11/10/2017 10:15 AM Medical Record Number: [id number] Patient Account Number: [id number] Date of Birth/Sex: 02-26-1961 (57 y.o. Male) Treating RN: [name] Primary Care Jaiyanna Safran: [name] Other Clinician: Referring Latajah Thuman: [name] Treating Tasheema Perrone/Extender: Cathie Olden in Treatment: 5 Visit Information History Since Last Visit Added or deleted any medications: No Patient Arrived: Wheel Chair Any new allergies or adverse reactions: No Arrival Time: 10:41 Had a fall or experienced change in No Accompanied By: caregiver activities of daily living that may affect Transfer Assistance: EasyPivot Patient risk of falls: Lift Signs or symptoms of abuse/neglect since last visito No Patient Identification Verified: Yes Hospitalized since last visit: No Secondary Verification Process Yes Has Dressing in Place as Prescribed: Yes Completed: Pain Present Now: No Patient Has Alerts: Yes Patient Alerts: DMII Electronic Signature(s) Signed: 11/10/2017 5:01:37 PM By: Gretta Cool, BSN, RN, CWS, Kim RN, BSN Entered By: Gretta Cool, BSN, RN, CWS, Kim on 11/10/2017 10:43:31 [name], [name] ([id number]) -------------------------------------------------------------------------------- Encounter Discharge Information Details Patient Name: Brady, [name] Date of Service: 11/10/2017 10:15 AM Medical Record Number: [id number] Patient Account Number: [id number] Date of Birth/Sex: 08/26/1961 (57 y.o. Male) Treating RN: [name] Primary Care Terril Chestnut: [name] Other Clinician: Referring Walburga Hudman: [name] Treating Evonda Enge/Extender: Cathie Olden in Treatment: 5 Encounter Discharge Information Items Discharge Pain Level: 0 Discharge Condition: Stable Ambulatory Status: Wheelchair Discharge Destination: Home Transportation: Private  Auto Accompanied By: wife Schedule Follow-up Appointment: Yes Medication Reconciliation completed and Yes provided to Patient/Care Mahalia Dykes: Provided on Clinical Summary of Care: 11/10/2017 Form Type Recipient Paper Patient MM Electronic Signature(s) Signed: 11/15/2017 10:47:22 AM By: Ruthine Dose Entered By: Ruthine Dose on 11/10/2017 11:22:44 [name], [name] ([id number]) -------------------------------------------------------------------------------- Lower Extremity Assessment Details Patient Name: Brady, [name] Date of Service: 11/10/2017 10:15 AM Medical Record Number: [id number] Patient Account Number: [id number] Date of Birth/Sex: 03-06-1961 (57 y.o. Male) Treating RN: [name] Primary Care Arriona Prest: [name] Other Clinician: Referring Deshara Rossi: [name] Treating Eduarda Scrivens/Extender: Cathie Olden in Treatment: 5 Electronic Signature(s) Signed: 11/10/2017 5:01:37 PM By: Gretta Cool, BSN, RN, CWS, Kim RN, BSN Entered By: Gretta Cool, BSN, RN, CWS, Kim on 11/10/2017 10:51:11 [name], [name] ([id number]) -------------------------------------------------------------------------------- Multi Wound Chart Details Patient Name: Brady, [name] Date of Service: 11/10/2017 10:15 AM Medical Record Number: [id number] Patient Account Number: [id number] Date of Birth/Sex: [birth year] (56 y.o. Male) Treating RN: [name] Primary Care Emmajean Ratledge: [name] Other Clinician: Referring Reynard Christoffersen: [name] Treating Lakhia Gengler/Extender: Cathie Olden in Treatment: 5 Vital Signs Height(in): 71 Pulse(bpm): 56 Weight(lbs): 178 Blood Pressure(mmHg): 120/62 Body Mass Index(BMI): 25 Temperature(F): 98.7 Respiratory Rate 16 (breaths/min): Photos: [1:No Photos] [N/A:N/A] Wound Location: [1:Sacrum - Midline] [N/A:N/A] Wounding Event: [1:Pressure Injury] [N/A:N/A] Primary Etiology: [1:Pressure Ulcer] [N/A:N/A] Comorbid History: [1:Hypertension, Peripheral Arterial  Disease, Crohnos, Type II Diabetes] [N/A:N/A] Date Acquired: [1:09/05/2017] [N/A:N/A] Weeks of Treatment: [1:5] [N/A:N/A] Wound Status: [1:Open] [N/A:N/A] Measurements L x W x D [1:1.6x0.8x0.1] [N/A:N/A] (cm) Area (cm) : [1:1.005] [N/A:N/A] Volume (cm) : [1:0.101] [N/A:N/A] % Reduction in Area: [1:52.90%] [N/A:N/A] % Reduction in Volume: [1:52.80%] [N/A:N/A] Classification: [1:Category/Stage III] [N/A:N/A] Exudate Amount: [1:Medium] [N/A:N/A] Exudate Type: [1:Serous] [N/A:N/A] Exudate Color: [1:amber] [N/A:N/A] Wound Margin: [1:Flat and Intact] [N/A:N/A] Granulation Amount: [1:None Present (0%)] [N/A:N/A] Necrotic Amount: [1:Large (67-100%)] [N/A:N/A] Exposed Structures: [1:Fat Layer (Subcutaneous Tissue) Exposed: Yes Fascia: No Tendon: No Muscle: No Joint: No Bone: No] [N/A:N/A] Epithelialization: [1:None] [N/A:N/A] Debridement: [1:Debridement ([id number])] [N/A:N/A] Pre-procedure [1:11:03] [N/A:N/A]  Verification/Time Out Taken: Pain Control: [1:Other] [N/A:N/A] Tissue Debrided: [1:Fibrin/Slough, Subcutaneous] [N/A:N/A] Level: [1:Skin/Subcutaneous Tissue] [N/A:N/A] Debridement Area (sq cm): 1.28 N/A N/A Instrument: Curette N/A N/A Bleeding: Minimum N/A N/A Hemostasis Achieved: Pressure N/A N/A Procedural Pain: 0 N/A N/A Post Procedural Pain: 0 N/A N/A Debridement Treatment Procedure was tolerated well N/A N/A Response: Post Debridement 1.6x0.8x0.1 N/A N/A Measurements L x W x D (cm) Post Debridement Volume: 0.101 N/A N/A (cm) Post Debridement Stage: Category/Stage III N/A N/A Periwound Skin Texture: Scarring: Yes N/A N/A Excoriation: No Induration: No Callus: No Crepitus: No Rash: No Periwound Skin Moisture: Maceration: No N/A N/A Dry/Scaly: No Periwound Skin Color: Ecchymosis: Yes N/A N/A Atrophie Blanche: No Cyanosis: No Erythema: No Hemosiderin Staining: No Mottled: No Pallor: No Rubor: No Temperature: No Abnormality N/A N/A Tenderness on  Palpation: Yes N/A N/A Wound Preparation: Ulcer Cleansing: N/A N/A Rinsed/Irrigated with Saline Topical Anesthetic Applied: Other: lidocaine 4% Procedures Performed: Debridement N/A N/A Treatment Notes Wound #1 (Midline Sacrum) 1. Cleansed with: Clean wound with Normal Saline 2. Anesthetic Topical Lidocaine 4% cream to wound bed prior to debridement 3. Peri-wound Care: Skin Prep 4. Dressing Applied: Santyl Ointment 5. Secondary Dressing Applied Bordered Foam Dressing Electronic Signature(s) Signed: 11/10/2017 4:11:02 PM By: Magnus Ivan, [name] ([id number]) Entered By: Lawanda Cousins on 11/10/2017 11:39:10 Edman, [name] ([id number]) -------------------------------------------------------------------------------- [place] Details Patient Name: Brady, [name] Date of Service: 11/10/2017 10:15 AM Medical Record Number: [id number] Patient Account Number: [id number] Date of Birth/Sex: 1961/08/15 (57 y.o. Male) Treating RN: [name] Primary Care Jimena Wieczorek: [name] Other Clinician: Referring Derril Franek: [name] Treating Aldous Housel/Extender: Cathie Olden in Treatment: 5 Active Inactive ` Abuse / Safety / Falls / Self Care Management Nursing Diagnoses: Impaired physical mobility Goals: Patient will remain injury free related to falls Date Initiated: 10/06/2017 Target Resolution Date: 12/24/2017 Goal Status: Active Interventions: Assess fall risk on admission and as needed Notes: ` Orientation to the Wound Care Program Nursing Diagnoses: Knowledge deficit related to the wound healing center program Goals: Patient/caregiver will verbalize understanding of the [facility] Program Date Initiated: 10/06/2017 Target Resolution Date: 12/24/2017 Goal Status: Active Interventions: Provide education on orientation to the wound center Notes: ` Pressure Nursing Diagnoses: Knowledge deficit related to management of  pressures ulcers Goals: Patient will remain free from development of additional pressure ulcers Date Initiated: 11/10/2017 Target Resolution Date: 11/25/2017 Goal Status: Active Interventions: Brady, [name] ([id number]) Assess: immobility, friction, shearing, incontinence upon admission and as needed Assess offloading mechanisms upon admission and as needed Treatment Activities: Patient referred for pressure reduction/relief devices : 11/10/2017 Notes: ` Wound/Skin Impairment Nursing Diagnoses: Impaired tissue integrity Goals: Ulcer/skin breakdown will heal within 14 weeks Date Initiated: 10/06/2017 Target Resolution Date: 12/24/2017 Goal Status: Active Interventions: Assess patient/caregiver ability to obtain necessary supplies Assess patient/caregiver ability to perform ulcer/skin care regimen upon admission and as needed Assess ulceration(s) every visit Notes: Electronic Signature(s) Signed: 11/10/2017 5:01:37 PM By: Gretta Cool, BSN, RN, CWS, Kim RN, BSN Entered By: Gretta Cool, BSN, RN, CWS, Kim on 11/10/2017 10:51:39 [name] ([id number]) -------------------------------------------------------------------------------- Pain Assessment Details Patient Name: Brady, [name] Date of Service: 11/10/2017 10:15 AM Medical Record Number: [id number] Patient Account Number: [id number] Date of Birth/Sex: October 05, 1961 (57 y.o. Male) Treating RN: [name] Primary Care Christabell Loseke: [name] Other Clinician: Referring Rana Adorno: [name] Treating Sony Schlarb/Extender: Cathie Olden in Treatment: 5 Active Problems Location of Pain Severity and Description of Pain Patient Has Paino No Site Locations With Dressing Change: No  Pain Management and Medication Current Pain Management: Electronic Signature(s) Signed: 11/10/2017 5:01:37 PM By: Gretta Cool, BSN, RN, CWS, Kim RN, BSN Entered By: Gretta Cool, BSN, RN, CWS, Kim on 11/10/2017 10:43:41 [name]  ([id number]) -------------------------------------------------------------------------------- Patient/Caregiver Education Details Patient Name: Brady, [name] Date of Service: 11/10/2017 10:15 AM Medical Record Number: [id number] Patient Account Number: [id number] Date of Birth/Gender: 09-Jul-1961 (57 y.o. Male) Treating RN: [name] Primary Care Physician: [name] Other Clinician: Referring Physician: [name] Treating Physician/Extender: Cathie Olden in Treatment: 5 Education Assessment Education Provided To: Patient Education Topics Provided Wound/Skin Impairment: Handouts: Caring for Your Ulcer Methods: Demonstration, Explain/Verbal Responses: State content correctly Electronic Signature(s) Signed: 11/10/2017 5:01:37 PM By: Gretta Cool, BSN, RN, CWS, Kim RN, BSN Entered By: Gretta Cool, BSN, RN, CWS, Kim on 11/10/2017 11:17:35 Nuzzo, [name] ([id number]) -------------------------------------------------------------------------------- Wound Assessment Details Patient Name: Brady, [name] Date of Service: 11/10/2017 10:15 AM Medical Record Number: [id number] Patient Account Number: [id number] Date of Birth/Sex: [birth year] (57 y.o. Male) Treating RN: [name] Primary Care Kimble Delaurentis: [name] Other Clinician: Referring Cacey Willow: [name] Treating Messi Twedt/Extender: Lawanda Cousins Weeks in Treatment: 5 Wound Status Wound Number: 1 Primary Pressure Ulcer Etiology: Wound Location: Sacrum - Midline Wound Open Wounding Event: Pressure Injury Status: Date Acquired: 09/05/2017 Comorbid Hypertension, Peripheral Arterial Disease, Weeks Of Treatment: 5 History: Crohnos, Type II Diabetes Clustered Wound: No Photos Photo Uploaded By: Gretta Cool, BSN, RN, CWS, Kim on 11/10/2017 12:33:18 Wound Measurements Length: (cm) 1.6 Width: (cm) 0.8 Depth: (cm) 0.1 Area: (cm) 1.005 Volume: (cm) 0.101 % Reduction in Area: 52.9% % Reduction in Volume:  52.8% Epithelialization: None Tunneling: No Undermining: No Wound Description Classification: Category/Stage III Foul Odor Wound Margin: Flat and Intact Slough/Fi Exudate Amount: Medium Exudate Type: Serous Exudate Color: amber After Cleansing: No brino Yes Wound Bed Granulation Amount: None Present (0%) Exposed Structure Necrotic Amount: Large (67-100%) Fascia Exposed: No Necrotic Quality: Adherent Slough Fat Layer (Subcutaneous Tissue) Exposed: Yes Tendon Exposed: No Muscle Exposed: No Joint Exposed: No Bone Exposed: No Periwound Skin Texture Texture Color No Abnormalities Noted: No No Abnormalities Noted: No Callus: No Atrophie Blanche: No Brady, [name] ([id number]) Crepitus: No Cyanosis: No Excoriation: No Ecchymosis: Yes Induration: No Erythema: No Rash: No Hemosiderin Staining: No Scarring: Yes Mottled: No Pallor: No Moisture Rubor: No No Abnormalities Noted: No Dry / Scaly: No Temperature / Pain Maceration: No Temperature: No Abnormality Tenderness on Palpation: Yes Wound Preparation Ulcer Cleansing: Rinsed/Irrigated with Saline Topical Anesthetic Applied: Other: lidocaine 4%, Treatment Notes Wound #1 (Midline Sacrum) 1. Cleansed with: Clean wound with Normal Saline 2. Anesthetic Topical Lidocaine 4% cream to wound bed prior to debridement 3. Peri-wound Care: Skin Prep 4. Dressing Applied: Santyl Ointment 5. Secondary Dressing Applied Bordered Foam Dressing Electronic Signature(s) Signed: 11/10/2017 5:01:37 PM By: Gretta Cool, BSN, RN, CWS, Kim RN, BSN Entered By: Gretta Cool, BSN, RN, CWS, Kim on 11/10/2017 10:51:01 [name] ([id number]) -------------------------------------------------------------------------------- [place] Details Patient Name: Bump, Humzah B. Date of Service: 11/10/2017 10:15 AM Medical Record Number: [id number] Patient Account Number: [id number] Date of Birth/Sex: [birth year] (57 y.o. Male) Treating RN: [name] Primary Care Rayann Jolley: [name] Other Clinician: Referring Katana Berthold: [name] Treating Chibueze Beasley/Extender: Cathie Olden in Treatment: 5 Vital Signs Time Taken: 10:43 Temperature (F): 98.7 Height (in): 71 Pulse (bpm): 68 Weight (lbs): 178 Respiratory Rate (breaths/min): 16 Body Mass Index (BMI): 24.8 Blood Pressure (mmHg): 120/62 Reference Range: 80 - 120 mg / dl Electronic Signature(s) Signed: 11/10/2017 5:01:37 PM By: Gretta Cool, BSN, RN, CWS, Kim  RN, BSN Entered By: Gretta Cool, BSN, RN, CWS, Kim on 11/10/2017 10:45:58

## 2017-11-17 ENCOUNTER — Telehealth: Payer: Self-pay | Admitting: Family Medicine

## 2017-11-17 NOTE — Telephone Encounter (Signed)
Sela Hua with [facility] called to let Dr. Caryn Section know that pt miss his home visit that was scheduled for today. Jeneen Rinks stated wife advised that pt had an appt with another provider today. Please advise. Thanks TNP

## 2017-11-17 NOTE — Telephone Encounter (Signed)
Please advise 

## 2017-11-24 ENCOUNTER — Encounter: Payer: BLUE CROSS/BLUE SHIELD | Attending: Nurse Practitioner | Admitting: Nurse Practitioner

## 2017-11-24 DIAGNOSIS — Z8673 Personal history of transient ischemic attack (TIA), and cerebral infarction without residual deficits: Secondary | ICD-10-CM | POA: Insufficient documentation

## 2017-11-24 DIAGNOSIS — Z992 Dependence on renal dialysis: Secondary | ICD-10-CM | POA: Diagnosis not present

## 2017-11-24 DIAGNOSIS — E11622 Type 2 diabetes mellitus with other skin ulcer: Secondary | ICD-10-CM | POA: Insufficient documentation

## 2017-11-24 DIAGNOSIS — Z87891 Personal history of nicotine dependence: Secondary | ICD-10-CM | POA: Insufficient documentation

## 2017-11-24 DIAGNOSIS — L89153 Pressure ulcer of sacral region, stage 3: Secondary | ICD-10-CM | POA: Insufficient documentation

## 2017-11-24 DIAGNOSIS — N186 End stage renal disease: Secondary | ICD-10-CM | POA: Diagnosis not present

## 2017-11-24 DIAGNOSIS — E1122 Type 2 diabetes mellitus with diabetic chronic kidney disease: Secondary | ICD-10-CM | POA: Diagnosis not present

## 2017-11-24 DIAGNOSIS — I120 Hypertensive chronic kidney disease with stage 5 chronic kidney disease or end stage renal disease: Secondary | ICD-10-CM | POA: Insufficient documentation

## 2017-11-24 DIAGNOSIS — E1151 Type 2 diabetes mellitus with diabetic peripheral angiopathy without gangrene: Secondary | ICD-10-CM | POA: Diagnosis not present

## 2017-11-24 DIAGNOSIS — Z89511 Acquired absence of right leg below knee: Secondary | ICD-10-CM | POA: Insufficient documentation

## 2017-11-25 DIAGNOSIS — N186 End stage renal disease: Secondary | ICD-10-CM | POA: Diagnosis not present

## 2017-11-25 DIAGNOSIS — I120 Hypertensive chronic kidney disease with stage 5 chronic kidney disease or end stage renal disease: Secondary | ICD-10-CM | POA: Diagnosis not present

## 2017-11-25 DIAGNOSIS — E1122 Type 2 diabetes mellitus with diabetic chronic kidney disease: Secondary | ICD-10-CM | POA: Diagnosis not present

## 2017-11-25 DIAGNOSIS — Z4781 Encounter for orthopedic aftercare following surgical amputation: Secondary | ICD-10-CM | POA: Diagnosis not present

## 2017-11-25 NOTE — Progress Notes (Signed)
[name] ([id number]) Visit Report for 11/24/2017 Chief Complaint Document Details Patient Name: Brady, [name] Date of Service: 11/24/2017 10:15 AM Medical Record Number: [id number] Patient Account Number: [id number] Date of Birth/Sex: 13-Feb-1961 (57 y.o. Male) Treating RN: Montey Hora Primary Care Provider: [name] Other Clinician: Referring Provider: [name] Treating Provider/Extender: Cathie Olden in Treatment: 7 Information Obtained from: Patient Chief Complaint He is here for evaluation of sacral pressure ulcer and non-healing wound to right bka stump Electronic Signature(s) Signed: 11/24/2017 10:45:17 AM By: Lawanda Cousins Entered By: Lawanda Cousins on 11/24/2017 10:45:16 [name] ([id number]) -------------------------------------------------------------------------------- Debridement Details Patient Name: Brady, [name] Date of Service: 11/24/2017 10:15 AM Medical Record Number: [id number] Patient Account Number: [id number] Date of Birth/Sex: May 11, 1961 (57 y.o. Male) Treating RN: Montey Hora Primary Care Provider: [name] Other Clinician: Referring Provider: [name] Treating Provider/Extender: Cathie Olden in Treatment: 7 Debridement Performed for Wound #1 Midline Sacrum Assessment: Performed By: Physician Lawanda Cousins, NP Debridement: Debridement Pre-procedure Verification/Time Yes - 10:36 Out Taken: Start Time: 10:36 Pain Control: Lidocaine 4% Topical Solution Level: Skin/Subcutaneous Tissue Total Area Debrided (L x W): 1.1 (cm) x 0.5 (cm) = 0.55 (cm) Tissue and other material Viable, Non-Viable, Fibrin/Slough, Subcutaneous debrided: Instrument: Blade Bleeding: Minimum Hemostasis Achieved: Pressure End Time: 10:39 Procedural Pain: 0 Post Procedural Pain: 0 Response to Treatment: Procedure was tolerated well Post Debridement Measurements of Total Wound Length: (cm) 1.1 Stage: Category/Stage  III Width: (cm) 0.5 Depth: (cm) 0.2 Volume: (cm) 0.086 Character of Wound/Ulcer Post Improved Debridement: Post Procedure Diagnosis Same as Pre-procedure Electronic Signature(s) Signed: 11/24/2017 10:44:05 AM By: Lawanda Cousins Signed: 11/24/2017 4:11:27 PM By: Montey Hora Entered By: Lawanda Cousins on 11/24/2017 10:44:05 [name] ([id number]) -------------------------------------------------------------------------------- HPI Details Patient Name: Brady, [name] Date of Service: 11/24/2017 10:15 AM Medical Record Number: [id number] Patient Account Number: [id number] Date of Birth/Sex: 28-Mar-1961 (57 y.o. Male) Treating RN: Montey Hora Primary Care Provider: [name] Other Clinician: Referring Provider: [name] Treating Provider/Extender: Cathie Olden in Treatment: 7 History of Present Illness Location: Patient has a ulcer to Sacrum Quality: denies pain Severity: denies pain Duration: months, but unable to clearly articulate timeframe Context: ulcer occurred secondary to pressure HPI Description: 10-06-17 he is here for initial evaluation of the sacral ulcer. He is unable to articulate exactly this is a states "months". 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 He is currently in a facility, [facility]. He is unsure if he has a pressure reduction mattress. He admits to sitting the majority of the day and sleeping on his back. We discussed the need for lateral sleeping, which he states he is able to do. He voices no complaints of pain, discomfort, fever, chills, ill feeling. He tolerated debridement. 10/26/17 on evaluation today patient sacral wound appears to be doing excellent. He has been tolerating the dressing changes without complication. He does have some discomfort especially when he has to sit in the chair at dialysis for an extended period of time. With that being said other than that he  tries to stay offloaded as much as possible and I think he has done a very good job in that regard. There does not appear to be any new pressure injury. 11/10/17 He is here in follow up evaluation of a sacral ulcer. He is now home, have been using prisma. He presents with essentially unchanged measurements and increased in nonviable tissue. Prior  to prisma they were using santyl, will transition back to santyl (spouse to call clinic if they do not have santyl at home). We discussed more aggressive offloading as he continues to sleep on his back, improved diet as he tends to eat "junk food". He does use a pressure redistribution cushion while in dialysis. Follow up in two weeks per their preference. 11/24/17-he is here in follow-up evaluation for a sacral ulcer and dates that his vascular surgeon at [facility] wants to "know if we can use the same medicine" on the BKA stump. Patient's bowel did contact the office, they do not have Santyl at home as originally thought. We will initiate Santyl to both wounds today. He states he does "the best I can" as far as repositioning but has inconsistent offloading while sleeping. He does use the chair cushion while in dialysis. Continue with follow-up in 2 weeks per their preference Electronic Signature(s) Signed: 11/24/2017 10:47:44 AM By: Lawanda Cousins Entered By: Lawanda Cousins on 11/24/2017 10:47:43 [name] ([id number]) -------------------------------------------------------------------------------- Physician Orders Details Patient Name: Brady, [name] Date of Service: 11/24/2017 10:15 AM Medical Record Number: [id number] Patient Account Number: [id number] Date of Birth/Sex: [birth year] (57 y.o. Male) Treating RN: Montey Hora Primary Care Provider: [name] Other Clinician: Referring Provider: [name] Treating Provider/Extender: Cathie Olden in Treatment: 7 Verbal / Phone Orders: No Diagnosis Coding Wound Cleansing Wound #1  Midline Sacrum o Clean wound with Normal Saline. o May Shower, gently pat wound dry prior to applying new dressing. Wound #2 Right Amputation Site - Below Knee o Clean wound with Normal Saline. o May Shower, gently pat wound dry prior to applying new dressing. Anesthetic (add to Medication List) Wound #1 Midline Sacrum o Topical Lidocaine 4% cream applied to wound bed prior to debridement (In Clinic Only). Wound #2 Right Amputation Site - Below Knee o Topical Lidocaine 4% cream applied to wound bed prior to debridement (In Clinic Only). Skin Barriers/Peri-Wound Care Wound #1 Midline Sacrum o Skin Prep Wound #2 Right Amputation Site - Below Knee o Skin Prep Primary Wound Dressing Wound #1 Midline Sacrum o Santyl Ointment Wound #2 Right Amputation Site - Below Knee o Santyl Ointment Secondary Dressing Wound #1 Midline Sacrum o Dry Gauze o Boardered Foam Dressing Wound #2 Right Amputation Site - Below Knee o Dry Gauze o Boardered Foam Dressing Dressing Change Frequency Wound #1 Midline Sacrum o Change dressing every day. Brady, [name] ([id number]) Wound #2 Right Amputation Site - Below Knee o Change dressing every day. Follow-up Appointments Wound #1 Midline Sacrum o Return Appointment in 2 weeks. Wound #2 Right Amputation Site - Below Knee o Return Appointment in 2 weeks. Off-Loading Wound #1 Midline Sacrum o Roho cushion for wheelchair o Mattress o Turn and reposition every 2 hours Wound #2 Right Amputation Site - Below Knee o Roho cushion for wheelchair o Mattress o Turn and reposition every 2 hours Additional Orders / Instructions Wound #1 Midline Sacrum o Increase protein intake. Wound #2 Right Amputation Site - Below Knee o Increase protein intake. Home Health Wound #1 [place] Visits - [place] Please call [place] and let us know which company is visiting  patient so signed orders can be faxed - signed orders sent with patient - [place] Nurse may visit PRN to address patientos wound care needs. o FACE TO FACE ENCOUNTER: MEDICARE and MEDICAID PATIENTS: I certify that this patient is under my care and  that I had a face-to-face encounter that meets the physician face-to-face encounter requirements with this patient on this date. The encounter with the patient was in whole or in part for the following MEDICAL CONDITION: (primary reason for [place]) MEDICAL NECESSITY: I certify, that based on my findings, NURSING services are a medically necessary home health service. HOME BOUND STATUS: I certify that my clinical findings support that this patient is homebound (i.e., Due to illness or injury, pt requires aid of supportive devices such as crutches, cane, wheelchairs, walkers, the use of special transportation or the assistance of another person to leave their place of residence. There is a normal inability to leave the home and doing so requires considerable and taxing effort. Other absences are for medical reasons / religious services and are infrequent or of short duration when for other reasons). o If current dressing causes regression in wound condition, may D/C ordered dressing product/s and apply Normal Saline Moist Dressing daily until next [place] / Other MD appointment. [place] of regression in wound condition at [phone]. o Please direct any NON-WOUND related issues/requests for orders to patient's Primary Care Physician Wound #2 Right Amputation Site - Below Knee o [place] Visits - [place] Please call [place] and let us know which company is visiting patient so signed orders can be faxed - signed orders sent with patient - [place] Nurse may visit PRN to address patientos wound care needs. o FACE TO FACE ENCOUNTER:  MEDICARE and MEDICAID PATIENTS: I certify that this patient is under my care and that I had a face-to-face encounter that meets the physician face-to-face encounter requirements with this patient on this date. The encounter with the patient was in whole or in part for the following [place] ([id number]) CONDITION: (primary reason for Home Healthcare) MEDICAL NECESSITY: I certify, that based on my findings, NURSING services are a medically necessary home health service. HOME BOUND STATUS: I certify that my clinical findings support that this patient is homebound (i.e., Due to illness or injury, pt requires aid of supportive devices such as crutches, cane, wheelchairs, walkers, the use of special transportation or the assistance of another person to leave their place of residence. There is a normal inability to leave the home and doing so requires considerable and taxing effort. Other absences are for medical reasons / religious services and are infrequent or of short duration when for other reasons). o If current dressing causes regression in wound condition, may D/C ordered dressing product/s and apply Normal Saline Moist Dressing daily until next [place] / Other MD appointment. [place] of regression in wound condition at [phone]. o Please direct any NON-WOUND related issues/requests for orders to patient's Primary Care Physician Medications-please add to medication list. Wound #1 Midline Sacrum o Santyl Enzymatic Ointment Wound #2 Right Amputation Site - Below Knee o Santyl Enzymatic Ointment Electronic Signature(s) Signed: 11/24/2017 3:49:02 PM By: Lawanda Cousins Entered By: Lawanda Cousins on 11/24/2017 11:25:10 Brady, Toribio Brady ([id number]) -------------------------------------------------------------------------------- Problem List Details Patient Name: Brady, [name] Date of Service: 11/24/2017 10:15 AM Medical Record Number:  [id number] Patient Account Number: [id number] Date of Birth/Sex: 04/21/1961 (57 y.o. Male) Treating RN: Montey Hora Primary Care Provider: [name] Other Clinician: Referring Provider: [name] Treating Provider/Extender: Cathie Olden in Treatment: 7 Active Problems ICD-10 Encounter Code Description Active Date Diagnosis L89.153 Pressure ulcer of sacral region, stage 3 10/06/2017  Yes Z89.511 Acquired absence of right leg below knee 10/06/2017 Yes N18.6 End stage renal disease 10/06/2017 Yes E11.622 Type 2 diabetes mellitus with other skin ulcer 10/06/2017 Yes S81.801S Unspecified open wound, right lower leg, sequela 11/24/2017 Yes T81.31XS Disruption of external operation (surgical) wound, not elsewhere 11/24/2017 Yes classified, sequela Inactive Problems Resolved Problems Electronic Signature(s) Signed: 11/24/2017 10:44:51 AM By: Lawanda Cousins Previous Signature: 11/24/2017 10:43:31 AM Version By: Lawanda Cousins Entered By: Lawanda Cousins on 11/24/2017 10:44:50 Huneke, Toribio Brady ([id number]) -------------------------------------------------------------------------------- Progress Note Details Patient Name: Brady, [name] Date of Service: 11/24/2017 10:15 AM Medical Record Number: [id number] Patient Account Number: [id number] Date of Birth/Sex: Feb 24, 1961 (57 y.o. Male) Treating RN: Montey Hora Primary Care Provider: [name] Other Clinician: Referring Provider: [name] Treating Provider/Extender: Cathie Olden in Treatment: 7 Subjective Chief Complaint Information obtained from Patient He is here for evaluation of sacral pressure ulcer and non-healing wound to right bka stump History of Present Illness (HPI) The following HPI elements were documented for the patient's wound: Location: Patient has a ulcer to Sacrum Quality: denies pain Severity: denies pain Duration: months, but unable to clearly articulate timeframe Context: ulcer occurred  secondary to pressure 10-06-17 he is here for initial evaluation of the sacral ulcer. He is unable to articulate exactly this is a states "months". 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 He is currently in a facility, [facility]. He is unsure if he has a pressure reduction mattress. He admits to sitting the majority of the day and sleeping on his back. We discussed the need for lateral sleeping, which he states he is able to do. He voices no complaints of pain, discomfort, fever, chills, ill feeling. He tolerated debridement. 10/26/17 on evaluation today patient sacral wound appears to be doing excellent. He has been tolerating the dressing changes without complication. He does have some discomfort especially when he has to sit in the chair at dialysis for an extended period of time. With that being said other than that he tries to stay offloaded as much as possible and I think he has done a very good job in that regard. There does not appear to be any new pressure injury. 11/10/17 He is here in follow up evaluation of a sacral ulcer. He is now home, have been using prisma. He presents with essentially unchanged measurements and increased in nonviable tissue. Prior to prisma they were using santyl, will transition back to santyl (spouse to call clinic if they do not have santyl at home). We discussed more aggressive offloading as he continues to sleep on his back, improved diet as he tends to eat "junk food". He does use a pressure redistribution cushion while in dialysis. Follow up in two weeks per their preference. 11/24/17-he is here in follow-up evaluation for a sacral ulcer and dates that his vascular surgeon at [facility] wants to "know if we can use the same medicine" on the BKA stump. Patient's bowel did contact the office, they do not have Santyl at home as originally thought. We will initiate Santyl to both wounds today. He states he does "the  best I can" as far as repositioning but has inconsistent offloading while sleeping. He does use the chair cushion while in dialysis. Continue with follow-up in 2 weeks per their preference Patient History Information obtained from Patient. Family History Diabetes - Mother,Siblings, No family history of Cancer, Heart Disease, Hereditary Spherocytosis, Hypertension, Kidney Disease, Lung  Disease, Seizures, Stroke, Thyroid Problems, Tuberculosis. Social History Former smoker - quit 20+ years ago, Marital Status - Married, Alcohol Use - Never, Drug Use - No History, Caffeine Use - [place], Lenvil B. ([id number]) Daily. Medical And Surgical History Notes Genitourinary renal disorder - CKD Neurologic CVA - 3 months ago Objective Constitutional Vitals Time Taken: 10:27 AM, Height: 71 in, Weight: 178 lbs, BMI: 24.8, Temperature: 98.4 F, Pulse: 66 bpm, Respiratory Rate: 18 breaths/min, Blood Pressure: 126/80 mmHg. Integumentary (Hair, Skin) Wound #1 status is Open. Original cause of wound was Pressure Injury. The wound is located on the Midline Sacrum. The wound measures 1.1cm length x 0.5cm width x 0.1cm depth; 0.432cm^2 area and 0.043cm^3 volume. There is Fat Layer (Subcutaneous Tissue) Exposed exposed. There is no tunneling or undermining noted. There is a medium amount of serous drainage noted. The wound margin is flat and intact. There is medium (34-66%) pink granulation within the wound bed. There is a medium (34-66%) amount of necrotic tissue within the wound bed including Adherent Slough. The periwound skin appearance exhibited: Scarring, Ecchymosis. The periwound skin appearance did not exhibit: Callus, Crepitus, Excoriation, Induration, Rash, Dry/Scaly, Maceration, Atrophie Blanche, Cyanosis, Hemosiderin Staining, Mottled, Pallor, Rubor, Erythema. Periwound temperature was noted as No Abnormality. Wound #2 status is Open. Original cause of wound was Surgical Injury. The wound is  located on the Right Amputation Site - Below Knee. The wound measures 0.6cm length x 0.6cm width x 0.2cm depth; 0.283cm^2 area and 0.057cm^3 volume. There is Fat Layer (Subcutaneous Tissue) Exposed exposed. There is no tunneling or undermining noted. There is a large amount of serous drainage noted. The wound margin is flat and intact. There is no granulation within the wound bed. There is a large (67-100%) amount of necrotic tissue within the wound bed including Eschar and Adherent Slough. The periwound skin appearance did not exhibit: Callus, Crepitus, Excoriation, Induration, Rash, Scarring, Dry/Scaly, Maceration, Atrophie Blanche, Cyanosis, Ecchymosis, Hemosiderin Staining, Mottled, Pallor, Rubor, Erythema. Periwound temperature was noted as No Abnormality. Assessment Active Problems ICD-10 L89.153 - Pressure ulcer of sacral region, stage 3 Z89.511 - Acquired absence of right leg below knee N18.6 - End stage renal disease E11.622 - Type 2 diabetes mellitus with other skin ulcer S81.801S - Unspecified open wound, right lower leg, sequela T81.31XS - Disruption of external operation (surgical) wound, not elsewhere classified, sequela Brady, [name] ([id number]) Procedures Wound #1 Pre-procedure diagnosis of Wound #1 is a Pressure Ulcer located on the Midline Sacrum . There was a Skin/Subcutaneous Tissue Debridement ([id number]) debridement with total area of 0.55 sq cm performed by Lawanda Cousins, NP. with the following instrument(s): Blade to remove Viable and Non-Viable tissue/material including Fibrin/Slough and Subcutaneous after achieving pain control using Lidocaine 4% Topical Solution. A time out was conducted at 10:36, prior to the start of the procedure. A Minimum amount of bleeding was controlled with Pressure. The procedure was tolerated well with a pain level of 0 throughout and a pain level of 0 following the procedure. Post Debridement Measurements: 1.1cm length x 0.5cm  width x 0.2cm depth; 0.086cm^3 volume. Post debridement Stage noted as Category/Stage III. Character of Wound/Ulcer Post Debridement is improved. Post procedure Diagnosis Wound #1: Same as Pre-Procedure Plan Wound Cleansing: Wound #1 Midline Sacrum: Clean wound with Normal Saline. May Shower, gently pat wound dry prior to applying new dressing. Wound #2 Right Amputation Site - Below Knee: Clean wound with Normal Saline. May Shower, gently pat wound dry prior to applying new dressing. Anesthetic (add to Medication  List): Wound #1 Midline Sacrum: Topical Lidocaine 4% cream applied to wound bed prior to debridement (In Clinic Only). Wound #2 Right Amputation Site - Below Knee: Topical Lidocaine 4% cream applied to wound bed prior to debridement (In Clinic Only). Skin Barriers/Peri-Wound Care: Wound #1 Midline Sacrum: Skin Prep Wound #2 Right Amputation Site - Below Knee: Skin Prep Primary Wound Dressing: Wound #1 Midline Sacrum: Santyl Ointment Wound #2 Right Amputation Site - Below Knee: Santyl Ointment Secondary Dressing: Wound #1 Midline Sacrum: Dry Gauze Boardered Foam Dressing Wound #2 Right Amputation Site - Below Knee: Dry Gauze Boardered Foam Dressing Dressing Change Frequency: Wound #1 Midline Sacrum: Change dressing every day. Gugel, [name] ([id number]) Wound #2 Right Amputation Site - Below Knee: Change dressing every day. Follow-up Appointments: Wound #1 Midline Sacrum: Return Appointment in 2 weeks. Wound #2 Right Amputation Site - Below Knee: Return Appointment in 2 weeks. Off-Loading: Wound #1 Midline Sacrum: Roho cushion for wheelchair Mattress Turn and reposition every 2 hours Wound #2 Right Amputation Site - Below Knee: Roho cushion for wheelchair Mattress Turn and reposition every 2 hours Additional Orders / Instructions: Wound #1 Midline Sacrum: Increase protein intake. Wound #2 Right Amputation Site - Below Knee: Increase protein  intake. Home Health: Wound #1 Midline Sacrum: [place] Visits - [place] Please call [facility] and let us know which company is visiting patient so signed orders can be faxed - signed orders sent with patient - [place] Nurse may visit PRN to address patient s wound care needs. FACE TO FACE ENCOUNTER: MEDICARE and MEDICAID PATIENTS: I certify that this patient is under my care and that I had a face-to-face encounter that meets the physician face-to-face encounter requirements with this patient on this date. The encounter with the patient was in whole or in part for the following MEDICAL CONDITION: (primary reason for [place]) MEDICAL NECESSITY: I certify, that based on my findings, NURSING services are a medically necessary home health service. HOME BOUND STATUS: I certify that my clinical findings support that this patient is homebound (i.e., Due to illness or injury, pt requires aid of supportive devices such as crutches, cane, wheelchairs, walkers, the use of special transportation or the assistance of another person to leave their place of residence. There is a normal inability to leave the home and doing so requires considerable and taxing effort. Other absences are for medical reasons / religious services and are infrequent or of short duration when for other reasons). If current dressing causes regression in wound condition, may D/C ordered dressing product/s and apply Normal Saline Moist Dressing daily until next [place] / Other MD appointment. [place] of regression in wound condition at [phone]. Please direct any NON-WOUND related issues/requests for orders to patient's Primary Care Physician Wound #2 Right Amputation Site - Below Knee: [place] Visits - [place] Please call [place] and let us know which company is visiting patient so signed orders can be faxed - signed orders  sent with patient - [place] Nurse may visit PRN to address patient s wound care needs. FACE TO FACE ENCOUNTER: MEDICARE and MEDICAID PATIENTS: I certify that this patient is under my care and that I had a face-to-face encounter that meets the physician face-to-face encounter requirements with this patient on this date. The encounter with the patient was in whole or in part for the following MEDICAL CONDITION: (primary reason for Home  Healthcare) MEDICAL NECESSITY: I certify, that based on my findings, NURSING services are a medically necessary home health service. HOME BOUND STATUS: I certify that my clinical findings support that this patient is homebound (i.e., Due to illness or injury, pt requires aid of supportive devices such as crutches, cane, wheelchairs, walkers, the use of special transportation or the assistance of another person to leave their place of residence. There is a normal inability to leave the home and doing so requires considerable and taxing effort. Other absences are for medical reasons / religious services and are infrequent or of short duration when for other reasons). If current dressing causes regression in wound condition, may D/C ordered dressing product/s and apply Normal Saline Moist Dressing daily until next [place] / Other MD appointment. [place] of regression in wound condition at [phone]. Please direct any NON-WOUND related issues/requests for orders to patient's Primary Care Physician Medications-please add to medication list.: Wound #1 Midline Sacrum: Santyl Enzymatic Ointment Wound #2 Right Amputation Site - Below Knee: Brady, [name] ([id number]) Santyl Enzymatic Ointment 1. Santyl to both wounds 2. Continue with aggressive offloading, as tolerated and able 3. Follow-up in 2 weeks Electronic Signature(s) Signed: 11/24/2017 11:25:31 AM By: Lawanda Cousins Entered By: Lawanda Cousins on 11/24/2017  11:25:31 Prust, Toribio Brady ([id number]) -------------------------------------------------------------------------------- ROS/PFSH Details Patient Name: Brady, [name] Date of Service: 11/24/2017 10:15 AM Medical Record Number: [id number] Patient Account Number: [id number] Date of Birth/Sex: [birth year] (57 y.o. Male) Treating RN: Montey Hora Primary Care Provider: [name] Other Clinician: Referring Provider: [name] Treating Provider/Extender: Cathie Olden in Treatment: 7 Information Obtained From Patient Wound History Do you currently have one or more open woundso Yes How many open wounds do you currently haveo 1 Approximately how long have you had your woundso about 2 months How have you been treating your wound(s) until nowo santyl Has your wound(s) ever healed and then re-openedo No Have you had any lab work done in the past montho No Have you tested positive for an antibiotic resistant organism (MRSA, VRE)o No Have you tested positive for osteomyelitis (bone infection)o No Have you had any tests for circulation on your legso Yes Who ordered the testo PCP Where was the test doneo [facility] Medical History: Negative for: Cataracts; Glaucoma; Optic Neuritis Hematologic/Lymphatic Medical History: Negative for: Anemia; Hemophilia; Human Immunodeficiency Virus; Lymphedema; Sickle Cell Disease Respiratory Medical History: Negative for: Aspiration; Asthma; Chronic Obstructive Pulmonary Disease (COPD); Pneumothorax; Sleep Apnea; Tuberculosis Cardiovascular Medical History: Positive for: Hypertension; Peripheral Arterial Disease Negative for: Angina; Arrhythmia; Congestive Heart Failure; Coronary Artery Disease; Deep Vein Thrombosis; Hypotension; Myocardial Infarction; Peripheral Venous Disease; Phlebitis; Vasculitis Gastrointestinal Medical History: Positive for: Crohnos Negative for: Cirrhosis ; Colitis; Hepatitis A; Hepatitis B; Hepatitis  C Endocrine Medical History: Positive for: Type II Diabetes Brady, [name] ([id number]) Time with diabetes: 20+ years Treated with: Insulin Blood sugar tested every day: Yes Tested : Genitourinary Medical History: Past Medical History Notes: renal disorder - CKD Immunological Medical History: Negative for: Lupus Erythematosus; Raynaudos; Scleroderma Musculoskeletal Medical History: Negative for: Gout; Rheumatoid Arthritis; Osteoarthritis; Osteomyelitis Neurologic Medical History: Negative for: Dementia; Neuropathy; Quadriplegia; Paraplegia; Seizure Disorder Past Medical History Notes: CVA - 3 months ago Oncologic Medical History: Negative for: Received Chemotherapy; Received Radiation Immunizations Pneumococcal Vaccine: Received Pneumococcal Vaccination: Yes Implantable Devices Family and Social History Cancer: No; Diabetes: Yes - Mother,Siblings; Heart Disease: No; Hereditary Spherocytosis: No; Hypertension: No; Kidney Disease: No; Lung Disease: No; Seizures: No; Stroke: No; Thyroid  Problems: No; Tuberculosis: No; Former smoker - quit 20+ years ago; Marital Status - Married; Alcohol Use: Never; Drug Use: No History; Caffeine Use: Daily; Financial Concerns: No; Food, Clothing or Shelter Needs: No; Support System Lacking: No; Transportation Concerns: No; Advanced Directives: No; Patient does not want information on Advanced Directives Physician Affirmation I have reviewed and agree with the above information. Electronic Signature(s) Signed: 11/24/2017 3:49:02 PM By: Lawanda Cousins Signed: 11/24/2017 4:11:27 PM By: Montey Hora Entered By: Lawanda Cousins on 11/24/2017 10:47:58 Patteson, Toribio Brady ([id number]) -------------------------------------------------------------------------------- SuperBill Details Patient Name: Nunziata, [name] Date of Service: 11/24/2017 Medical Record Number: [id number] Patient Account Number: [id number] Date of Birth/Sex: 12-17-1960 (57 y.o.  Male) Treating RN: Montey Hora Primary Care Provider: [name] Other Clinician: Referring Provider: [name] Treating Provider/Extender: Cathie Olden in Treatment: 7 Diagnosis Coding ICD-10 Codes Code Description [phone] Pressure ulcer of sacral region, stage 3 Z89.511 Acquired absence of right leg below knee N18.6 End stage renal disease E11.622 Type 2 diabetes mellitus with other skin ulcer S81.801S Unspecified open wound, right lower leg, sequela T81.31XS Disruption of external operation (surgical) wound, not elsewhere classified, sequela Facility Procedures CPT4 Code: [id number] Description: 11042 - DEB SUBQ TISSUE 20 SQ CM/< ICD-10 Diagnosis Description L89.153 Pressure ulcer of sacral region, stage 3 N18.6 End stage renal disease E11.622 Type 2 diabetes mellitus with other skin ulcer Modifier: Quantity: 1 Physician Procedures CPT4 Code Description: [id number] 35361 - WC PHYS LEVEL 3 - EST PT ICD-10 Diagnosis Description S81.801S Unspecified open wound, right lower leg, sequela T81.31XS Disruption of external operation (surgical) wound, not elsewhe Z89.511 Acquired absence of  right leg below knee E11.622 Type 2 diabetes mellitus with other skin ulcer Modifier: re classified, se Quantity: 1 quela CPT4 Code Description: [id number] 08676 - WC PHYS SUBQ TISS 20 SQ CM ICD-10 Diagnosis Description L89.153 Pressure ulcer of sacral region, stage 3 N18.6 End stage renal disease E11.622 Type 2 diabetes mellitus with other skin ulcer Modifier: Quantity: 1 Electronic Signature(s) Signed: 11/24/2017 11:26:13 AM By: Lawanda Cousins Entered By: Lawanda Cousins on 11/24/2017 11:26:13

## 2017-11-25 NOTE — Progress Notes (Signed)
Surles, SANTANA EDELL ([id number]) Visit Report for 11/24/2017 Arrival Information Details Patient Name: Intriago, Rishit B. Date of Service: 11/24/2017 10:15 AM Medical Record Number: [id number] Patient Account Number: [id number] Date of Birth/Sex: 01-Apr-1961 (57 y.o. Male) Treating RN: Montey Hora Primary Care Korde Jeppsen: [name] Other Clinician: Referring Taye Cato: [name] Treating Miles Leyda/Extender: Cathie Olden in Treatment: 7 Visit Information History Since Last Visit Added or deleted any medications: No Patient Arrived: Wheel Chair Any new allergies or adverse reactions: No Arrival Time: 10:26 Had a fall or experienced change in No Accompanied By: self activities of daily living that may affect Transfer Assistance: None risk of falls: Patient Identification Verified: Yes Signs or symptoms of abuse/neglect since last visito No Secondary Verification Process Completed: Yes Hospitalized since last visit: No Patient Has Alerts: Yes Has Dressing in Place as Prescribed: Yes Patient Alerts: DMII Pain Present Now: No Electronic Signature(s) Signed: 11/24/2017 4:11:27 PM By: Montey Hora Entered By: Montey Hora on 11/24/2017 10:26:46 Debold, Toribio Harbour ([id number]) -------------------------------------------------------------------------------- Encounter Discharge Information Details Patient Name: Forman, Gabrien B. Date of Service: 11/24/2017 10:15 AM Medical Record Number: [id number] Patient Account Number: [id number] Date of Birth/Sex: 12/07/1960 (57 y.o. Male) Treating RN: Montey Hora Primary Care Gina Leblond: [name] Other Clinician: Referring Rayelynn Loyal: [name] Treating Neli Fofana/Extender: Cathie Olden in Treatment: 7 Encounter Discharge Information Items Discharge Pain Level: 0 Discharge Condition: Stable Ambulatory Status: Wheelchair Discharge Destination: Home Private Transportation: Auto Accompanied By: self Schedule Follow-up  Appointment: Yes Medication Reconciliation completed and provided No to Patient/Care Ovid Witman: Clinical Summary of Care: Electronic Signature(s) Signed: 11/24/2017 3:31:47 PM By: Montey Hora Entered By: Montey Hora on 11/24/2017 15:31:47 Schirm, Toribio Harbour ([id number]) -------------------------------------------------------------------------------- Multi Wound Chart Details Patient Name: Reczek, Devone B. Date of Service: 11/24/2017 10:15 AM Medical Record Number: [id number] Patient Account Number: [id number] Date of Birth/Sex: 26-Sep-1961 (57 y.o. Male) Treating RN: Montey Hora Primary Care Brooklynn Brandenburg: [name] Other Clinician: Referring Armstrong Creasy: [name] Treating Aundreya Souffrant/Extender: Cathie Olden in Treatment: 7 Vital Signs Height(in): 71 Pulse(bpm): 101 Weight(lbs): 178 Blood Pressure(mmHg): 126/80 Body Mass Index(BMI): 25 Temperature(F): 98.4 Respiratory Rate 18 (breaths/min): Photos: [1:No Photos] [2:No Photos] [N/A:N/A] Wound Location: [1:Sacrum - Midline] [2:Right Amputation Site - Below Knee] [N/A:N/A] Wounding Event: [1:Pressure Injury] [2:Surgical Injury] [N/A:N/A] Primary Etiology: [1:Pressure Ulcer] [2:Open Surgical Wound] [N/A:N/A] Comorbid History: [1:Hypertension, Peripheral Arterial Disease, Crohnos, Type II Diabetes] [2:Hypertension, Peripheral Arterial Disease, Crohnos, Type II Diabetes] [N/A:N/A] Date Acquired: [1:09/05/2017] [2:06/27/2017] [N/A:N/A] Weeks of Treatment: [1:7] [2:0] [N/A:N/A] Wound Status: [1:Open] [2:Open] [N/A:N/A] Measurements L x W x D [1:1.1x0.5x0.1] [2:0.6x0.6x0.2] [N/A:N/A] (cm) Area (cm) : [1:0.432] [2:0.283] [N/A:N/A] Volume (cm) : [1:0.043] [2:0.057] [N/A:N/A] % Reduction in Area: [1:79.80%] [2:N/A] [N/A:N/A] % Reduction in Volume: [1:79.90%] [2:N/A] [N/A:N/A] Classification: [1:Category/Stage III] [2:Full Thickness Without Exposed Support Structures] [N/A:N/A] Exudate Amount: [1:Medium] [2:Large]  [N/A:N/A] Exudate Type: [1:Serous] [2:Serous] [N/A:N/A] Exudate Color: [1:amber] [2:amber] [N/A:N/A] Wound Margin: [1:Flat and Intact] [2:Flat and Intact] [N/A:N/A] Granulation Amount: [1:Medium (34-66%)] [2:None Present (0%)] [N/A:N/A] Granulation Quality: [1:Pink] [2:N/A] [N/A:N/A] Necrotic Amount: [1:Medium (34-66%)] [2:Large (67-100%)] [N/A:N/A] Necrotic Tissue: [1:Adherent Slough] [2:Eschar, Adherent Slough] [N/A:N/A] Exposed Structures: [1:Fat Layer (Subcutaneous Tissue) Exposed: Yes Fascia: No Tendon: No Muscle: No Joint: No Bone: No] [2:Fat Layer (Subcutaneous Tissue) Exposed: Yes Fascia: No Tendon: No Muscle: No Joint: No Bone: No] [N/A:N/A] Epithelialization: [1:Small (1-33%)] [2:None] [N/A:N/A] Debridement: [1:Debridement ([id number]) 10:36] [2:N/A N/A] [N/A:N/A N/A] Pre-procedure Verification/Time Out Taken: Pain Control: Lidocaine 4% Topical Solution N/A N/A Tissue Debrided: Fibrin/Slough, Subcutaneous N/A N/A Level: Skin/Subcutaneous Tissue N/A N/A Debridement  Area (sq cm): 0.55 N/A N/A Instrument: Blade N/A N/A Bleeding: Minimum N/A N/A Hemostasis Achieved: Pressure N/A N/A Procedural Pain: 0 N/A N/A Post Procedural Pain: 0 N/A N/A Debridement Treatment Procedure was tolerated well N/A N/A Response: Post Debridement 1.1x0.5x0.2 N/A N/A Measurements L x W x D (cm) Post Debridement Volume: 0.086 N/A N/A (cm) Post Debridement Stage: Category/Stage III N/A N/A Periwound Skin Texture: Scarring: Yes Excoriation: No N/A Excoriation: No Induration: No Induration: No Callus: No Callus: No Crepitus: No Crepitus: No Rash: No Rash: No Scarring: No Periwound Skin Moisture: Maceration: No Maceration: No N/A Dry/Scaly: No Dry/Scaly: No Periwound Skin Color: Ecchymosis: Yes Atrophie Blanche: No N/A Atrophie Blanche: No Cyanosis: No Cyanosis: No Ecchymosis: No Erythema: No Erythema: No Hemosiderin Staining: No Hemosiderin Staining: No Mottled: No Mottled:  No Pallor: No Pallor: No Rubor: No Rubor: No Temperature: No Abnormality No Abnormality N/A Tenderness on Palpation: No No N/A Wound Preparation: Ulcer Cleansing: Ulcer Cleansing: N/A Rinsed/Irrigated with Saline Rinsed/Irrigated with Saline Topical Anesthetic Applied: Topical Anesthetic Applied: Other: lidocaine 4% None Procedures Performed: Debridement N/A N/A Treatment Notes Electronic Signature(s) Signed: 11/24/2017 11:24:56 AM By: Lawanda Cousins Previous Signature: 11/24/2017 10:43:42 AM Version By: Lawanda Cousins Entered By: Lawanda Cousins on 11/24/2017 11:24:56 Bakken, Toribio Harbour ([id number]) -------------------------------------------------------------------------------- [place] Details Patient Name: Mentzer, Eluterio B. Date of Service: 11/24/2017 10:15 AM Medical Record Number: [id number] Patient Account Number: [id number] Date of Birth/Sex: 05-06-1961 (57 y.o. Male) Treating RN: Montey Hora Primary Care Zayvian Mcmurtry: [name] Other Clinician: Referring Markice Torbert: [name] Treating Adalea Handler/Extender: Cathie Olden in Treatment: 7 Active Inactive ` Abuse / Safety / Falls / Self Care Management Nursing Diagnoses: Impaired physical mobility Goals: Patient will remain injury free related to falls Date Initiated: 10/06/2017 Target Resolution Date: 12/24/2017 Goal Status: Active Interventions: Assess fall risk on admission and as needed Notes: ` Orientation to the Wound Care Program Nursing Diagnoses: Knowledge deficit related to the wound healing center program Goals: Patient/caregiver will verbalize understanding of the [facility] Program Date Initiated: 10/06/2017 Target Resolution Date: 12/24/2017 Goal Status: Active Interventions: Provide education on orientation to the wound center Notes: ` Pressure Nursing Diagnoses: Knowledge deficit related to management of pressures ulcers Goals: Patient will remain free from  development of additional pressure ulcers Date Initiated: 11/10/2017 Target Resolution Date: 11/25/2017 Goal Status: Active Interventions: Gervase, Asar B. ([id number]) Assess: immobility, friction, shearing, incontinence upon admission and as needed Assess offloading mechanisms upon admission and as needed Treatment Activities: Patient referred for pressure reduction/relief devices : 11/10/2017 Notes: ` Wound/Skin Impairment Nursing Diagnoses: Impaired tissue integrity Goals: Ulcer/skin breakdown will heal within 14 weeks Date Initiated: 10/06/2017 Target Resolution Date: 12/24/2017 Goal Status: Active Interventions: Assess patient/caregiver ability to obtain necessary supplies Assess patient/caregiver ability to perform ulcer/skin care regimen upon admission and as needed Assess ulceration(s) every visit Notes: Electronic Signature(s) Signed: 11/24/2017 4:11:27 PM By: Montey Hora Entered By: Montey Hora on 11/24/2017 10:37:25 Mcilhenny, Toribio Harbour ([id number]) -------------------------------------------------------------------------------- Pain Assessment Details Patient Name: Yochim, Stokely B. Date of Service: 11/24/2017 10:15 AM Medical Record Number: [id number] Patient Account Number: [id number] Date of Birth/Sex: 1960/12/25 (57 y.o. Male) Treating RN: Montey Hora Primary Care Kasiyah Platter: [name] Other Clinician: Referring Kache Mcclurg: [name] Treating Fany Cavanaugh/Extender: Cathie Olden in Treatment: 7 Active Problems Location of Pain Severity and Description of Pain Patient Has Paino No Site Locations Pain Management and Medication Current Pain Management: Electronic Signature(s) Signed: 11/24/2017 4:11:27 PM By: Montey Hora Entered By: Montey Hora on 11/24/2017 10:26:52 Thornberry, Avante  Jacinto Reap ([id number]) -------------------------------------------------------------------------------- Patient/Caregiver Education Details Patient Name: Kandler, Lincon  B. Date of Service: 11/24/2017 10:15 AM Medical Record Number: [id number] Patient Account Number: [id number] Date of Birth/Gender: [birth year] (57 y.o. Male) Treating RN: Montey Hora Primary Care Physician: [name] Other Clinician: Referring Physician: [name] Treating Physician/Extender: Cathie Olden in Treatment: 7 Education Assessment Education Provided To: Patient Education Topics Provided Wound/Skin Impairment: Handouts: Other: wound care to continue as ordered Methods: Explain/Verbal Responses: State content correctly Electronic Signature(s) Signed: 11/24/2017 4:11:27 PM By: Montey Hora Entered By: Montey Hora on 11/24/2017 15:32:08 Guster, Naftula B. ([id number]) -------------------------------------------------------------------------------- Wound Assessment Details Patient Name: Kovalcik, Perlie B. Date of Service: 11/24/2017 10:15 AM Medical Record Number: [id number] Patient Account Number: [id number] Date of Birth/Sex: 02-15-1961 (57 y.o. Male) Treating RN: Montey Hora Primary Care Collyns Mcquigg: [name] Other Clinician: Referring Valeda Corzine: [name] Treating Tayler Heiden/Extender: Lawanda Cousins Weeks in Treatment: 7 Wound Status Wound Number: 1 Primary Pressure Ulcer Etiology: Wound Location: Sacrum - Midline Wound Open Wounding Event: Pressure Injury Status: Date Acquired: 09/05/2017 Comorbid Hypertension, Peripheral Arterial Disease, Weeks Of Treatment: 7 History: Crohnos, Type II Diabetes Clustered Wound: No Photos Photo Uploaded By: Montey Hora on 11/24/2017 14:35:02 Wound Measurements Length: (cm) 1.1 Width: (cm) 0.5 Depth: (cm) 0.1 Area: (cm) 0.432 Volume: (cm) 0.043 % Reduction in Area: 79.8% % Reduction in Volume: 79.9% Epithelialization: Small (1-33%) Tunneling: No Undermining: No Wound Description Classification: Category/Stage III Wound Margin: Flat and Intact Exudate Amount: Medium Exudate Type:  Serous Exudate Color: amber Foul Odor After Cleansing: No Slough/Fibrino Yes Wound Bed Granulation Amount: Medium (34-66%) Exposed Structure Granulation Quality: Pink Fascia Exposed: No Necrotic Amount: Medium (34-66%) Fat Layer (Subcutaneous Tissue) Exposed: Yes Necrotic Quality: Adherent Slough Tendon Exposed: No Muscle Exposed: No Joint Exposed: No Bone Exposed: No Periwound Skin Texture Stong, Keelin B. ([id number]) Texture Color No Abnormalities Noted: No No Abnormalities Noted: No Callus: No Atrophie Blanche: No Crepitus: No Cyanosis: No Excoriation: No Ecchymosis: Yes Induration: No Erythema: No Rash: No Hemosiderin Staining: No Scarring: Yes Mottled: No Pallor: No Moisture Rubor: No No Abnormalities Noted: No Dry / Scaly: No Temperature / Pain Maceration: No Temperature: No Abnormality Wound Preparation Ulcer Cleansing: Rinsed/Irrigated with Saline Topical Anesthetic Applied: Other: lidocaine 4%, Treatment Notes Wound #1 (Midline Sacrum) 1. Cleansed with: Clean wound with Normal Saline 2. Anesthetic Topical Lidocaine 4% cream to wound bed prior to debridement 3. Peri-wound Care: Skin Prep 4. Dressing Applied: Santyl Ointment 5. Secondary Dressing Applied Bordered Foam Dressing Electronic Signature(s) Signed: 11/24/2017 4:11:27 PM By: Montey Hora Entered By: Montey Hora on 11/24/2017 10:33:39 Ambers, Toribio Harbour ([id number]) -------------------------------------------------------------------------------- Wound Assessment Details Patient Name: Dewey, Aikam B. Date of Service: 11/24/2017 10:15 AM Medical Record Number: [id number] Patient Account Number: [id number] Date of Birth/Sex: Nov 06, 1960 (57 y.o. Male) Treating RN: Montey Hora Primary Care Nilson Tabora: [name] Other Clinician: Referring Itati Brocksmith: [name] Treating Lachell Rochette/Extender: Lawanda Cousins Weeks in Treatment: 7 Wound Status Wound Number: 2 Primary Open Surgical  Wound Etiology: Wound Location: Right Amputation Site - Below Knee Wound Open Wounding Event: Surgical Injury Status: Date Acquired: 06/27/2017 Comorbid Hypertension, Peripheral Arterial Disease, Weeks Of Treatment: 0 History: Crohnos, Type II Diabetes Clustered Wound: No Photos Photo Uploaded By: Montey Hora on 11/24/2017 14:35:02 Wound Measurements Length: (cm) 0.6 Width: (cm) 0.6 Depth: (cm) 0.2 Area: (cm) 0.283 Volume: (cm) 0.057 % Reduction in Area: % Reduction in Volume: Epithelialization: None Tunneling: No Undermining: No Wound Description Full Thickness Without Exposed Support Classification: Structures Wound Margin: Flat and Intact Exudate Large  Amount: Exudate Type: Serous Exudate Color: amber Foul Odor After Cleansing: No Slough/Fibrino Yes Wound Bed Granulation Amount: None Present (0%) Exposed Structure Necrotic Amount: Large (67-100%) Fascia Exposed: No Necrotic Quality: Eschar, Adherent Slough Fat Layer (Subcutaneous Tissue) Exposed: Yes Tendon Exposed: No Muscle Exposed: No Joint Exposed: No Bone Exposed: No Magloire, Oaklen B. ([id number]) Periwound Skin Texture Texture Color No Abnormalities Noted: No No Abnormalities Noted: No Callus: No Atrophie Blanche: No Crepitus: No Cyanosis: No Excoriation: No Ecchymosis: No Induration: No Erythema: No Rash: No Hemosiderin Staining: No Scarring: No Mottled: No Pallor: No Moisture Rubor: No No Abnormalities Noted: No Dry / Scaly: No Temperature / Pain Maceration: No Temperature: No Abnormality Wound Preparation Ulcer Cleansing: Rinsed/Irrigated with Saline Topical Anesthetic Applied: None Treatment Notes Wound #2 (Right Amputation Site - Below Knee) 1. Cleansed with: Clean wound with Normal Saline 2. Anesthetic Topical Lidocaine 4% cream to wound bed prior to debridement 3. Peri-wound Care: Skin Prep 4. Dressing Applied: Santyl Ointment 5. Secondary Dressing  Applied Bordered Foam Dressing Electronic Signature(s) Signed: 11/24/2017 4:11:27 PM By: Montey Hora Entered By: Montey Hora on 11/24/2017 10:45:53 Kiner, Toribio Harbour ([id number]) -------------------------------------------------------------------------------- [place] Details Patient Name: Lopresti, Srihari B. Date of Service: 11/24/2017 10:15 AM Medical Record Number: [id number] Patient Account Number: [id number] Date of Birth/Sex: 1961-09-05 (57 y.o. Male) Treating RN: Montey Hora Primary Care Kaleeah Gingerich: [name] Other Clinician: Referring Sujey Gundry: [name] Treating Mable Dara/Extender: Cathie Olden in Treatment: 7 Vital Signs Time Taken: 10:27 Temperature (F): 98.4 Height (in): 71 Pulse (bpm): 66 Weight (lbs): 178 Respiratory Rate (breaths/min): 18 Body Mass Index (BMI): 24.8 Blood Pressure (mmHg): 126/80 Reference Range: 80 - 120 mg / dl Electronic Signature(s) Signed: 11/24/2017 4:11:27 PM By: Montey Hora Entered By: Montey Hora on 11/24/2017 10:28:16

## 2017-11-29 ENCOUNTER — Telehealth: Payer: Self-pay | Admitting: Family Medicine

## 2017-11-29 NOTE — Telephone Encounter (Signed)
[name] with [facility] is requesting verbal orders for in home occupational therapy as follows:  Twice a week for 2 weeks Once a week for 1 week  Please advise. Thanks TNP

## 2017-11-29 NOTE — Telephone Encounter (Signed)
[name] as below.

## 2017-11-29 NOTE — Telephone Encounter (Signed)
Please review. Thanks!  

## 2017-11-29 NOTE — Telephone Encounter (Signed)
That's fine

## 2017-12-01 ENCOUNTER — Encounter: Payer: Self-pay | Admitting: Family Medicine

## 2017-12-01 ENCOUNTER — Ambulatory Visit: Payer: BLUE CROSS/BLUE SHIELD | Admitting: Nurse Practitioner

## 2017-12-01 ENCOUNTER — Ambulatory Visit (INDEPENDENT_AMBULATORY_CARE_PROVIDER_SITE_OTHER): Payer: BLUE CROSS/BLUE SHIELD | Admitting: Family Medicine

## 2017-12-01 VITALS — BP 110/68 | HR 63 | Temp 98.5°F | Resp 16

## 2017-12-01 DIAGNOSIS — Z992 Dependence on renal dialysis: Secondary | ICD-10-CM | POA: Diagnosis not present

## 2017-12-01 DIAGNOSIS — N186 End stage renal disease: Secondary | ICD-10-CM | POA: Diagnosis not present

## 2017-12-01 DIAGNOSIS — Z8673 Personal history of transient ischemic attack (TIA), and cerebral infarction without residual deficits: Secondary | ICD-10-CM

## 2017-12-01 DIAGNOSIS — E7800 Pure hypercholesterolemia, unspecified: Secondary | ICD-10-CM | POA: Diagnosis not present

## 2017-12-01 DIAGNOSIS — R569 Unspecified convulsions: Secondary | ICD-10-CM | POA: Diagnosis not present

## 2017-12-01 MED ORDER — SEVELAMER CARBONATE 800 MG PO TABS
800.0000 mg | ORAL_TABLET | Freq: Three times a day (TID) | ORAL | 1 refills | Status: DC
Start: 2017-12-01 — End: 2018-03-08

## 2017-12-01 NOTE — Progress Notes (Signed)
Patient: [name] Male    DOB: 1961/07/28   57 y.o.   MRN: [id number] Visit Date: 12/01/2017  Today's Provider: [name], MD   Chief Complaint  Patient presents with  . Follow-up   Subjective:    HPI  Vascular disorder of lower extremity From 10/20/2017-no changes were made. Continue regular visit to wound care clinic for sore on stump which is greatly improved. He is noted to have had atorvastatin increased to 80mg  and reports he is tolerating well.   He was started on Keppra in July 2018 by Dr. Albertine Patricia he was admitted to [facility] for CVA and seizures. He was supposed to have had follow up [facility] neurology, but this has not yet been arranged due to other reoccuring health events.  Review of [facility] telephone message from January 2019 looks like he is supposed to be following up with Dr. Augustin Coupe, but they have not yet made contact to get this scheduled.    Allergies  Allergen Reactions  . Vancomycin Shortness Of Breath    Eyes watering, SOB, wheezing  . Methotrexate Other (See Comments)    Blood count drops  . Tape      Current Outpatient Medications:  .  Alcohol Swabs PADS, Use as directed to check blood sugar three times daily for insulin dependent type 2 diabetes., Disp: 100 each, Rfl: 12 .  amLODipine (NORVASC) 10 MG tablet, Take 1 tablet (10 mg total) by mouth daily as needed., Disp: 30 tablet, Rfl: 5 .  aspirin EC 81 MG tablet, Take 81 mg by mouth daily., Disp: , Rfl:  .  atorvastatin (LIPITOR) 80 MG tablet, Take 1 tablet (80 mg total) by mouth daily., Disp: 90 tablet, Rfl: 3 .  calcitRIOL (ROCALTROL) 0.25 MCG capsule, Take 0.5 mcg by mouth daily. , Disp: , Rfl: 1 .  carvedilol (COREG) 25 MG tablet, Take 12.5 mg by mouth 2 (two) times daily. , Disp: , Rfl: 3 .  clopidogrel (PLAVIX) 75 MG tablet, Take 1 tablet (75 mg total) by mouth daily., Disp: 30 tablet, Rfl: 11 .  ferrous sulfate 325 (65 FE) MG tablet, Take 1 tablet by mouth daily., Disp: , Rfl:  .  furosemide  (LASIX) 80 MG tablet, Take 1 tablet (80 mg total) by mouth 2 (two) times daily., Disp: 60 tablet, Rfl: 4 .  gabapentin (NEURONTIN) 100 MG capsule, Take 2 capsules (200 mg total) by mouth 2 (two) times daily., Disp: 120 capsule, Rfl: 12 .  glucose blood (BAYER CONTOUR TEST) test strip, Check sugar three times daily for insulin dependent diabetes, Disp: 100 each, Rfl: 12 .  HYDROcodone-acetaminophen (NORCO/VICODIN) 5-325 MG tablet, Take 1 tablet by mouth every 4 (four) hours as needed., Disp: , Rfl:  .  inFLIXimab (REMICADE) 100 MG injection, Inject into the vein every 6 (six) weeks. , Disp: , Rfl:  .  insulin aspart (NOVOLOG FLEXPEN) 100 UNIT/ML FlexPen, Take 8-15 units three times a day before each meal, Disp: 1 pen, Rfl: 3 .  insulin glargine (LANTUS) 100 UNIT/ML injection, Inject 0.4 mLs (40 Units total) into the skin daily at 10 pm. (Patient taking differently: Inject 7 Units into the skin at bedtime. ), Disp: 10 mL, Rfl: 11 .  Insulin Pen Needle 32G X 6 MM MISC, Use to inject insulin via pen up to three times daily for insulin dependent type 2 diabetes., Disp: 100 each, Rfl: 12 .  levETIRAcetam (KEPPRA) 1000 MG tablet, Take 1,000 mg by mouth  at bedtime. , Disp: , Rfl: 0 .  lisinopril (PRINIVIL,ZESTRIL) 10 MG tablet, Take 1 tablet by mouth daily., Disp: , Rfl: 1 .  mirtazapine (REMERON) 15 MG tablet, Take 7.5 mg by mouth at bedtime., Disp: , Rfl:  .  nystatin (MYCOSTATIN) 100000 UNIT/ML suspension, Take 5 mLs (500,000 Units total) by mouth 4 (four) times daily., Disp: 60 mL, Rfl: 2 .  polyethylene glycol (MIRALAX / GLYCOLAX) packet, Take 1 packet by mouth as needed., Disp: , Rfl: 0 .  sevelamer carbonate (RENVELA) 800 MG tablet, Take 800 mg by mouth 3 (three) times daily., Disp: , Rfl:  .  sildenafil (VIAGRA) 100 MG tablet, Take 1 tablet (100 mg total) by mouth daily as needed., Disp: 6 tablet, Rfl: 1 .  sodium bicarbonate 650 MG tablet, Take 650 mg by mouth 2 (two) times daily., Disp: , Rfl:  .   triamcinolone ointment (KENALOG) 0.1 %, Apply 1 application topically as needed. , Disp: , Rfl: 0  Review of Systems  Constitutional: Negative for appetite change, chills and fever.  Respiratory: Negative for chest tightness, shortness of breath and wheezing.   Cardiovascular: Negative for chest pain and palpitations.  Gastrointestinal: Negative for abdominal pain, nausea and vomiting.    Social History   Tobacco Use  . Smoking status: Never Smoker  . Smokeless tobacco: Never Used  Substance Use Topics  . Alcohol use: No   Objective:   BP 110/68 (BP Location: Right Arm, Patient Position: Sitting, Cuff Size: Normal)   Pulse 63   Temp 98.5 F (36.9 C) (Oral)   Resp 16   SpO2 99%  Vitals:   12/01/17 1122  BP: 110/68  Pulse: 63  Resp: 16  Temp: 98.5 F (36.9 C)  TempSrc: Oral  SpO2: 99%     Physical Exam   General Appearance:    Alert, cooperative, no distress  Eyes:    PERRL, conjunctiva/corneas clear, EOM's intact       Lungs:     Clear to auscultation bilaterally, respirations unlabored  Heart:    Regular rate and rhythm  Ext:   Small erosion tip of stump. No erythema. No swelling.           Assessment & Plan:     1. Hypercholesteremia Requires high intensity statin, is tolerating 80mg  atorvastatin well.  - Lipid panel - Hepatic function panel  2. Seizure ([place]) Remains on keppra with no subsequent seizures. Need to get in with [facility] neurology as he has not had outpatient follow up since hospitalization last summer.  - Ambulatory referral to Neurology  3. History of CVA (cerebrovascular accident)  - Ambulatory referral to Neurology  4. End stage renal failure on dialysis [facility]) He is completely out of renvela which is refilled today. Advise will need to be refilled by nephrology in the future if he the intention is to remain on this medications.  - sevelamer carbonate (RENVELA) 800 MG tablet; Take 1 tablet (800 mg total) by mouth 3 (three) times daily.   Dispense: 90 tablet; Refill: 1  Return in about 3 months (around 02/28/2018).       [name], MD  [facility]

## 2017-12-02 LAB — LIPID PANEL
Chol/HDL Ratio: 2.4 ratio (ref 0.0–5.0)
Cholesterol, Total: 117 mg/dL (ref 100–199)
HDL: 48 mg/dL (ref >39)
LDL Calculated: 53 mg/dL (ref 0–99)
Triglycerides: 79 mg/dL (ref 0–149)
VLDL Cholesterol Cal: 16 mg/dL (ref 5–40)

## 2017-12-02 LAB — HEPATIC FUNCTION PANEL
ALT: 6 IU/L (ref 0–44)
AST: 9 IU/L (ref 0–40)
Albumin: 3.8 g/dL (ref 3.5–5.5)
Alkaline Phosphatase: 89 IU/L (ref 39–117)
Bilirubin Total: 0.2 mg/dL (ref 0.0–1.2)
Bilirubin, Direct: 0.08 mg/dL (ref 0.00–0.40)
Total Protein: 7.3 g/dL (ref 6.0–8.5)

## 2017-12-04 ENCOUNTER — Inpatient Hospital Stay
Admission: EM | Admit: 2017-12-04 | Discharge: 2017-12-12 | DRG: 871 | Disposition: A | Discharge status: Home / Self Care | Payer: BLUE CROSS/BLUE SHIELD | Attending: Internal Medicine | Admitting: Internal Medicine

## 2017-12-04 ENCOUNTER — Emergency Department: Payer: BLUE CROSS/BLUE SHIELD

## 2017-12-04 ENCOUNTER — Other Ambulatory Visit: Payer: Self-pay

## 2017-12-04 ENCOUNTER — Encounter: Payer: Self-pay | Admitting: Emergency Medicine

## 2017-12-04 DIAGNOSIS — Z86718 Personal history of other venous thrombosis and embolism: Secondary | ICD-10-CM

## 2017-12-04 DIAGNOSIS — L03314 Cellulitis of groin: Secondary | ICD-10-CM | POA: Diagnosis present

## 2017-12-04 DIAGNOSIS — T82838A Hemorrhage of vascular prosthetic devices, implants and grafts, initial encounter: Secondary | ICD-10-CM | POA: Diagnosis not present

## 2017-12-04 DIAGNOSIS — Z833 Family history of diabetes mellitus: Secondary | ICD-10-CM

## 2017-12-04 DIAGNOSIS — T8241XA Breakdown (mechanical) of vascular dialysis catheter, initial encounter: Secondary | ICD-10-CM | POA: Diagnosis present

## 2017-12-04 DIAGNOSIS — Z7902 Long term (current) use of antithrombotics/antiplatelets: Secondary | ICD-10-CM

## 2017-12-04 DIAGNOSIS — Z7982 Long term (current) use of aspirin: Secondary | ICD-10-CM

## 2017-12-04 DIAGNOSIS — R509 Fever, unspecified: Secondary | ICD-10-CM

## 2017-12-04 DIAGNOSIS — Z992 Dependence on renal dialysis: Secondary | ICD-10-CM | POA: Diagnosis not present

## 2017-12-04 DIAGNOSIS — I120 Hypertensive chronic kidney disease with stage 5 chronic kidney disease or end stage renal disease: Secondary | ICD-10-CM | POA: Diagnosis present

## 2017-12-04 DIAGNOSIS — N186 End stage renal disease: Secondary | ICD-10-CM | POA: Diagnosis present

## 2017-12-04 DIAGNOSIS — G9341 Metabolic encephalopathy: Secondary | ICD-10-CM | POA: Diagnosis present

## 2017-12-04 DIAGNOSIS — Y832 Surgical operation with anastomosis, bypass or graft as the cause of abnormal reaction of the patient, or of later complication, without mention of misadventure at the time of the procedure: Secondary | ICD-10-CM | POA: Diagnosis not present

## 2017-12-04 DIAGNOSIS — N492 Inflammatory disorders of scrotum: Secondary | ICD-10-CM | POA: Diagnosis present

## 2017-12-04 DIAGNOSIS — G40909 Epilepsy, unspecified, not intractable, without status epilepticus: Secondary | ICD-10-CM | POA: Diagnosis present

## 2017-12-04 DIAGNOSIS — E785 Hyperlipidemia, unspecified: Secondary | ICD-10-CM | POA: Diagnosis present

## 2017-12-04 DIAGNOSIS — L89151 Pressure ulcer of sacral region, stage 1: Secondary | ICD-10-CM | POA: Diagnosis not present

## 2017-12-04 DIAGNOSIS — D631 Anemia in chronic kidney disease: Secondary | ICD-10-CM | POA: Diagnosis present

## 2017-12-04 DIAGNOSIS — Z8249 Family history of ischemic heart disease and other diseases of the circulatory system: Secondary | ICD-10-CM

## 2017-12-04 DIAGNOSIS — E11649 Type 2 diabetes mellitus with hypoglycemia without coma: Secondary | ICD-10-CM | POA: Diagnosis present

## 2017-12-04 DIAGNOSIS — G9340 Encephalopathy, unspecified: Secondary | ICD-10-CM | POA: Diagnosis present

## 2017-12-04 DIAGNOSIS — Z794 Long term (current) use of insulin: Secondary | ICD-10-CM

## 2017-12-04 DIAGNOSIS — T82868A Thrombosis of vascular prosthetic devices, implants and grafts, initial encounter: Secondary | ICD-10-CM | POA: Diagnosis not present

## 2017-12-04 DIAGNOSIS — E1122 Type 2 diabetes mellitus with diabetic chronic kidney disease: Secondary | ICD-10-CM | POA: Diagnosis present

## 2017-12-04 DIAGNOSIS — Z881 Allergy status to other antibiotic agents status: Secondary | ICD-10-CM

## 2017-12-04 DIAGNOSIS — Z89511 Acquired absence of right leg below knee: Secondary | ICD-10-CM

## 2017-12-04 DIAGNOSIS — L02214 Cutaneous abscess of groin: Secondary | ICD-10-CM | POA: Diagnosis present

## 2017-12-04 DIAGNOSIS — L8990 Pressure ulcer of unspecified site, unspecified stage: Secondary | ICD-10-CM

## 2017-12-04 DIAGNOSIS — Z79899 Other long term (current) drug therapy: Secondary | ICD-10-CM

## 2017-12-04 DIAGNOSIS — Y712 Prosthetic and other implants, materials and accessory cardiovascular devices associated with adverse incidents: Secondary | ICD-10-CM | POA: Diagnosis present

## 2017-12-04 DIAGNOSIS — L89152 Pressure ulcer of sacral region, stage 2: Secondary | ICD-10-CM | POA: Diagnosis present

## 2017-12-04 DIAGNOSIS — N2581 Secondary hyperparathyroidism of renal origin: Secondary | ICD-10-CM | POA: Diagnosis present

## 2017-12-04 DIAGNOSIS — Z8673 Personal history of transient ischemic attack (TIA), and cerebral infarction without residual deficits: Secondary | ICD-10-CM

## 2017-12-04 DIAGNOSIS — A419 Sepsis, unspecified organism: Secondary | ICD-10-CM | POA: Diagnosis present

## 2017-12-04 HISTORY — DX: Encephalopathy, unspecified: G93.40

## 2017-12-04 LAB — CBC WITH DIFFERENTIAL/PLATELET
Basophils Absolute: 0 10*3/uL (ref 0–0.1)
Basophils Relative: 0 %
Eosinophils Absolute: 0 10*3/uL (ref 0–0.7)
Eosinophils Relative: 0 %
HCT: 37.4 % — ABNORMAL LOW (ref 40.0–52.0)
Hemoglobin: 11.7 g/dL — ABNORMAL LOW (ref 13.0–18.0)
Lymphocytes Relative: 5 %
Lymphs Abs: 0.8 10*3/uL — ABNORMAL LOW (ref 1.0–3.6)
MCH: 27.6 pg (ref 26.0–34.0)
MCHC: 31.2 g/dL — ABNORMAL LOW (ref 32.0–36.0)
MCV: 88.2 fL (ref 80.0–100.0)
Monocytes Absolute: 0.5 10*3/uL (ref 0.2–1.0)
Monocytes Relative: 3 %
Neutro Abs: 15.4 10*3/uL — ABNORMAL HIGH (ref 1.4–6.5)
Neutrophils Relative %: 92 %
Platelets: 283 10*3/uL (ref 150–440)
RBC: 4.24 MIL/uL — ABNORMAL LOW (ref 4.40–5.90)
RDW: 17.6 % — ABNORMAL HIGH (ref 11.5–14.5)
WBC: 16.8 10*3/uL — ABNORMAL HIGH (ref 3.8–10.6)

## 2017-12-04 LAB — INFLUENZA PANEL BY PCR (TYPE A & B)
Influenza A By PCR: NEGATIVE
Influenza B By PCR: NEGATIVE

## 2017-12-04 LAB — URINALYSIS, COMPLETE (UACMP) WITH MICROSCOPIC
Bilirubin Urine: NEGATIVE
Glucose, UA: >=500 mg/dL — AB
Hgb urine dipstick: NEGATIVE
Ketones, ur: NEGATIVE mg/dL
Leukocytes, UA: NEGATIVE
Nitrite: NEGATIVE
Protein, ur: 100 mg/dL — AB
Specific Gravity, Urine: 1.011 (ref 1.005–1.030)
pH: 6 (ref 5.0–8.0)

## 2017-12-04 LAB — COMPREHENSIVE METABOLIC PANEL
ALT: 10 U/L — ABNORMAL LOW (ref 17–63)
AST: 11 U/L — ABNORMAL LOW (ref 15–41)
Albumin: 3.9 g/dL (ref 3.5–5.0)
Alkaline Phosphatase: 87 U/L (ref 38–126)
Anion gap: 16 — ABNORMAL HIGH (ref 5–15)
BUN: 44 mg/dL — ABNORMAL HIGH (ref 6–20)
CO2: 23 mmol/L (ref 22–32)
Calcium: 9.3 mg/dL (ref 8.9–10.3)
Chloride: 100 mmol/L — ABNORMAL LOW (ref 101–111)
Creatinine, Ser: 6.95 mg/dL — ABNORMAL HIGH (ref 0.61–1.24)
GFR calc Af Amer: 9 mL/min — ABNORMAL LOW (ref >60)
GFR calc non Af Amer: 8 mL/min — ABNORMAL LOW (ref >60)
Glucose, Bld: 200 mg/dL — ABNORMAL HIGH (ref 65–99)
Potassium: 4.1 mmol/L (ref 3.5–5.1)
Sodium: 139 mmol/L (ref 135–145)
Total Bilirubin: 0.5 mg/dL (ref 0.3–1.2)
Total Protein: 8.8 g/dL — ABNORMAL HIGH (ref 6.5–8.1)

## 2017-12-04 LAB — PROTIME-INR
INR: 1.12
Prothrombin Time: 14.3 seconds (ref 11.4–15.2)

## 2017-12-04 LAB — CBC
HCT: 32.2 % — ABNORMAL LOW (ref 40.0–52.0)
Hemoglobin: 10.1 g/dL — ABNORMAL LOW (ref 13.0–18.0)
MCH: 28 pg (ref 26.0–34.0)
MCHC: 31.5 g/dL — ABNORMAL LOW (ref 32.0–36.0)
MCV: 89 fL (ref 80.0–100.0)
Platelets: 242 10*3/uL (ref 150–440)
RBC: 3.62 MIL/uL — ABNORMAL LOW (ref 4.40–5.90)
RDW: 18.3 % — ABNORMAL HIGH (ref 11.5–14.5)
WBC: 14 10*3/uL — ABNORMAL HIGH (ref 3.8–10.6)

## 2017-12-04 LAB — BRAIN NATRIURETIC PEPTIDE: B Natriuretic Peptide: 177 pg/mL — ABNORMAL HIGH (ref 0.0–100.0)

## 2017-12-04 LAB — CREATININE, SERUM
Creatinine, Ser: 7.1 mg/dL — ABNORMAL HIGH (ref 0.61–1.24)
GFR calc Af Amer: 9 mL/min — ABNORMAL LOW (ref >60)
GFR calc non Af Amer: 8 mL/min — ABNORMAL LOW (ref >60)

## 2017-12-04 LAB — GLUCOSE, CAPILLARY
Glucose-Capillary: 220 mg/dL — ABNORMAL HIGH (ref 65–99)
Glucose-Capillary: 243 mg/dL — ABNORMAL HIGH (ref 65–99)
Glucose-Capillary: 164 mg/dL — ABNORMAL HIGH (ref 65–99)
Glucose-Capillary: 104 mg/dL — ABNORMAL HIGH (ref 65–99)

## 2017-12-04 LAB — APTT: aPTT: 37 seconds — ABNORMAL HIGH (ref 24–36)

## 2017-12-04 LAB — TROPONIN I: Troponin I: 0.05 ng/mL (ref <0.03)

## 2017-12-04 LAB — MRSA PCR SCREENING: MRSA by PCR: NEGATIVE

## 2017-12-04 LAB — AMMONIA: Ammonia: 29 umol/L (ref 9–35)

## 2017-12-04 LAB — LACTIC ACID, PLASMA: Lactic Acid, Venous: 1.4 mmol/L (ref 0.5–1.9)

## 2017-12-04 LAB — PROCALCITONIN: Procalcitonin: 2.08 ng/mL

## 2017-12-04 MED ORDER — LEVETIRACETAM 500 MG PO TABS
1000.0000 mg | ORAL_TABLET | Freq: Every day | ORAL | Status: DC
  Administered 2017-12-04 – 2017-12-11 (×8): 1000 mg via ORAL
  Filled 2017-12-04 (×8): qty 2

## 2017-12-04 MED ORDER — POLYETHYLENE GLYCOL 3350 17 G PO PACK: 1.0000 | PACK | ORAL | Status: DC | PRN

## 2017-12-04 MED ORDER — INSULIN ASPART 100 UNIT/ML ~~LOC~~ SOLN: 0.0000 [IU] | Freq: Every day | SUBCUTANEOUS | Status: DC

## 2017-12-04 MED ORDER — ONDANSETRON HCL 4 MG PO TABS: 4.0000 mg | ORAL_TABLET | Freq: Four times a day (QID) | ORAL | Status: DC | PRN

## 2017-12-04 MED ORDER — MIRTAZAPINE 15 MG PO TABS
7.5000 mg | ORAL_TABLET | Freq: Every day | ORAL | Status: DC
  Administered 2017-12-04 – 2017-12-11 (×8): 7.5 mg via ORAL
  Filled 2017-12-04 (×8): qty 1

## 2017-12-04 MED ORDER — CARVEDILOL 12.5 MG PO TABS
12.5000 mg | ORAL_TABLET | Freq: Two times a day (BID) | ORAL | Status: DC
  Administered 2017-12-04 – 2017-12-11 (×15): 12.5 mg via ORAL
  Filled 2017-12-04 (×15): qty 1

## 2017-12-04 MED ORDER — FERROUS SULFATE 325 (65 FE) MG PO TABS
325.0000 mg | ORAL_TABLET | Freq: Every day | ORAL | Status: DC
Start: 2017-12-04 — End: 2017-12-09
  Administered 2017-12-04 – 2017-12-09 (×6): 325 mg via ORAL
  Filled 2017-12-04 (×6): qty 1

## 2017-12-04 MED ORDER — INSULIN ASPART 100 UNIT/ML ~~LOC~~ SOLN
0.0000 [IU] | Freq: Three times a day (TID) | SUBCUTANEOUS | Status: DC
  Administered 2017-12-04 (×2): 5 [IU] via SUBCUTANEOUS
  Administered 2017-12-04: 3 [IU] via SUBCUTANEOUS
  Administered 2017-12-07 (×2): 5 [IU] via SUBCUTANEOUS
  Administered 2017-12-08: 2 [IU] via SUBCUTANEOUS
  Administered 2017-12-08: 3 [IU] via SUBCUTANEOUS
  Administered 2017-12-08: 2 [IU] via SUBCUTANEOUS
  Administered 2017-12-09 – 2017-12-10 (×4): 3 [IU] via SUBCUTANEOUS
  Administered 2017-12-11: 2 [IU] via SUBCUTANEOUS
  Administered 2017-12-11: 5 [IU] via SUBCUTANEOUS
  Administered 2017-12-12: 2 [IU] via SUBCUTANEOUS
  Filled 2017-12-04 (×12): qty 1

## 2017-12-04 MED ORDER — CALCITRIOL 0.25 MCG PO CAPS
0.5000 ug | ORAL_CAPSULE | Freq: Every day | ORAL | Status: DC
  Administered 2017-12-04 – 2017-12-11 (×7): 0.5 ug via ORAL
  Filled 2017-12-04 (×2): qty 1
  Filled 2017-12-04: qty 2
  Filled 2017-12-04 (×4): qty 1
  Filled 2017-12-04: qty 2
  Filled 2017-12-04: qty 1

## 2017-12-04 MED ORDER — SODIUM CHLORIDE 0.9 % IV SOLN: 500.0000 mg | INTRAVENOUS | Status: DC

## 2017-12-04 MED ORDER — AMLODIPINE BESYLATE 5 MG PO TABS
5.0000 mg | ORAL_TABLET | Freq: Every day | ORAL | Status: DC
  Administered 2017-12-04 – 2017-12-08 (×5): 5 mg via ORAL
  Filled 2017-12-04 (×5): qty 1

## 2017-12-04 MED ORDER — SODIUM CHLORIDE 0.9 % IV SOLN
480.0000 mg | INTRAVENOUS | Status: DC
  Administered 2017-12-04 – 2017-12-08 (×2): 480 mg via INTRAVENOUS
  Filled 2017-12-04 (×4): qty 9.6

## 2017-12-04 MED ORDER — INSULIN GLARGINE 100 UNIT/ML ~~LOC~~ SOLN
40.0000 [IU] | Freq: Every day | SUBCUTANEOUS | Status: DC
  Administered 2017-12-05: 40 [IU] via SUBCUTANEOUS
  Filled 2017-12-04 (×3): qty 0.4

## 2017-12-04 MED ORDER — HEPARIN SODIUM (PORCINE) 5000 UNIT/ML IJ SOLN
5000.0000 [IU] | Freq: Three times a day (TID) | INTRAMUSCULAR | Status: DC
  Administered 2017-12-04 – 2017-12-10 (×15): 5000 [IU] via SUBCUTANEOUS
  Filled 2017-12-04 (×14): qty 1

## 2017-12-04 MED ORDER — TRIAMCINOLONE ACETONIDE 0.1 % EX OINT
1.0000 "application " | TOPICAL_OINTMENT | CUTANEOUS | Status: DC | PRN
  Administered 2017-12-09: 1 via TOPICAL
  Filled 2017-12-04: qty 15

## 2017-12-04 MED ORDER — SODIUM CHLORIDE 0.9 % IV SOLN
2.0000 g | Freq: Once | INTRAVENOUS | Status: DC
Start: 2017-12-04 — End: 2017-12-04

## 2017-12-04 MED ORDER — INSULIN ASPART 100 UNIT/ML ~~LOC~~ SOLN
8.0000 [IU] | Freq: Three times a day (TID) | SUBCUTANEOUS | Status: DC
  Administered 2017-12-04 – 2017-12-05 (×5): 8 [IU] via SUBCUTANEOUS
  Filled 2017-12-04 (×5): qty 1

## 2017-12-04 MED ORDER — SODIUM CHLORIDE 0.9% FLUSH
3.0000 mL | Freq: Two times a day (BID) | INTRAVENOUS | Status: DC
  Administered 2017-12-04 – 2017-12-10 (×12): 3 mL via INTRAVENOUS

## 2017-12-04 MED ORDER — LEVOFLOXACIN IN D5W 500 MG/100ML IV SOLN: 500.0000 mg | INTRAVENOUS | Status: DC

## 2017-12-04 MED ORDER — SODIUM CHLORIDE 0.9 % IV SOLN: 250.0000 mL | INTRAVENOUS | Status: DC | PRN

## 2017-12-04 MED ORDER — SODIUM CHLORIDE 0.9 % IV SOLN
500.0000 mg | INTRAVENOUS | Status: DC
  Filled 2017-12-04: qty 0.5

## 2017-12-04 MED ORDER — ACETAMINOPHEN 325 MG PO TABS
650.0000 mg | ORAL_TABLET | Freq: Four times a day (QID) | ORAL | Status: DC | PRN
Start: 2017-12-04 — End: 2017-12-12
  Administered 2017-12-04 – 2017-12-08 (×3): 650 mg via ORAL
  Filled 2017-12-04 (×3): qty 2

## 2017-12-04 MED ORDER — SEVELAMER CARBONATE 800 MG PO TABS
800.0000 mg | ORAL_TABLET | Freq: Three times a day (TID) | ORAL | Status: DC
  Administered 2017-12-04 – 2017-12-11 (×16): 800 mg via ORAL
  Filled 2017-12-04 (×18): qty 1

## 2017-12-04 MED ORDER — ATORVASTATIN CALCIUM 20 MG PO TABS
80.0000 mg | ORAL_TABLET | Freq: Every day | ORAL | Status: DC
  Administered 2017-12-04 – 2017-12-08 (×5): 80 mg via ORAL
  Filled 2017-12-04 (×5): qty 4

## 2017-12-04 MED ORDER — SODIUM BICARBONATE 650 MG PO TABS
650.0000 mg | ORAL_TABLET | Freq: Two times a day (BID) | ORAL | Status: DC
  Administered 2017-12-04 – 2017-12-11 (×15): 650 mg via ORAL
  Filled 2017-12-04 (×15): qty 1

## 2017-12-04 MED ORDER — DOCUSATE SODIUM 100 MG PO CAPS
100.0000 mg | ORAL_CAPSULE | Freq: Two times a day (BID) | ORAL | Status: DC
  Administered 2017-12-04 – 2017-12-11 (×12): 100 mg via ORAL
  Filled 2017-12-04 (×14): qty 1

## 2017-12-04 MED ORDER — CLOPIDOGREL BISULFATE 75 MG PO TABS
75.0000 mg | ORAL_TABLET | Freq: Every day | ORAL | Status: DC
  Administered 2017-12-04 – 2017-12-09 (×6): 75 mg via ORAL
  Filled 2017-12-04 (×6): qty 1

## 2017-12-04 MED ORDER — LISINOPRIL 10 MG PO TABS
10.0000 mg | ORAL_TABLET | Freq: Every day | ORAL | Status: DC
  Administered 2017-12-04 – 2017-12-08 (×5): 10 mg via ORAL
  Filled 2017-12-04 (×5): qty 1

## 2017-12-04 MED ORDER — OSELTAMIVIR PHOSPHATE 30 MG PO CAPS: 30.0000 mg | ORAL_CAPSULE | ORAL | Status: DC

## 2017-12-04 MED ORDER — SODIUM CHLORIDE 0.9 % IV SOLN: 1.0000 g | Freq: Three times a day (TID) | INTRAVENOUS | Status: DC

## 2017-12-04 MED ORDER — SODIUM CHLORIDE 0.9 % IV BOLUS (SEPSIS)
500.0000 mL | Freq: Once | INTRAVENOUS | Status: AC
  Administered 2017-12-04: 500 mL via INTRAVENOUS

## 2017-12-04 MED ORDER — LEVOFLOXACIN IN D5W 750 MG/150ML IV SOLN
750.0000 mg | Freq: Once | INTRAVENOUS | Status: AC
  Administered 2017-12-04: 750 mg via INTRAVENOUS
  Filled 2017-12-04: qty 150

## 2017-12-04 MED ORDER — SODIUM CHLORIDE 0.9 % IV SOLN
500.0000 mg | Freq: Once | INTRAVENOUS | Status: AC
  Administered 2017-12-04: 500 mg via INTRAVENOUS
  Filled 2017-12-04: qty 0.5

## 2017-12-04 MED ORDER — SODIUM CHLORIDE 0.9 % IV SOLN
500.0000 mg | INTRAVENOUS | Status: DC
  Administered 2017-12-05 – 2017-12-08 (×4): 500 mg via INTRAVENOUS
  Filled 2017-12-04 (×5): qty 0.5

## 2017-12-04 MED ORDER — AZTREONAM 2 G IJ SOLR
2.0000 g | Freq: Once | INTRAMUSCULAR | Status: AC
  Administered 2017-12-04: 2 g via INTRAVENOUS
  Filled 2017-12-04: qty 2

## 2017-12-04 MED ORDER — SODIUM CHLORIDE 0.9% FLUSH: 3.0000 mL | INTRAVENOUS | Status: DC | PRN

## 2017-12-04 MED ORDER — ACETAMINOPHEN 500 MG PO TABS
1000.0000 mg | ORAL_TABLET | Freq: Once | ORAL | Status: AC
  Administered 2017-12-04: 1000 mg via ORAL
  Filled 2017-12-04: qty 2

## 2017-12-04 MED ORDER — INSULIN ASPART 100 UNIT/ML ~~LOC~~ SOLN
0.0000 [IU] | Freq: Three times a day (TID) | SUBCUTANEOUS | Status: DC
  Filled 2017-12-04 (×3): qty 1

## 2017-12-04 MED ORDER — FUROSEMIDE 40 MG PO TABS
80.0000 mg | ORAL_TABLET | Freq: Two times a day (BID) | ORAL | Status: DC
  Administered 2017-12-04 – 2017-12-11 (×13): 80 mg via ORAL
  Filled 2017-12-04 (×2): qty 4
  Filled 2017-12-04 (×9): qty 2
  Filled 2017-12-04: qty 4
  Filled 2017-12-04: qty 2

## 2017-12-04 MED ORDER — ACETAMINOPHEN 650 MG RE SUPP: 650.0000 mg | Freq: Four times a day (QID) | RECTAL | Status: DC | PRN

## 2017-12-04 MED ORDER — ASPIRIN EC 81 MG PO TBEC
81.0000 mg | DELAYED_RELEASE_TABLET | Freq: Every day | ORAL | Status: DC
  Administered 2017-12-04 – 2017-12-09 (×6): 81 mg via ORAL
  Filled 2017-12-04 (×6): qty 1

## 2017-12-04 MED ORDER — ONDANSETRON HCL 4 MG/2ML IJ SOLN: 4.0000 mg | Freq: Four times a day (QID) | INTRAMUSCULAR | Status: DC | PRN

## 2017-12-04 MED ORDER — LEVOFLOXACIN IN D5W 750 MG/150ML IV SOLN: 750.0000 mg | Freq: Once | INTRAVENOUS | Status: DC

## 2017-12-04 NOTE — ED Provider Notes (Signed)
[name] Emergency Department Provider Note   ____________________________________________   First MD Initiated Contact with Patient 12/04/17 0032     (approximate)  I have reviewed the triage vital signs and the nursing notes.   HISTORY  Chief Complaint Fever    HPI [name] is a 57 y.o. male brought to the ED from home via EMS with a chief complaint of fever and decreased level of consciousness.  Patient has a history of ESRD on HD M/W/F, hypertension, diabetes who his family called EMS for decreased LOC and fever of 103 F.  Patient took Tylenol approximately 8 PM.  He is awake and talkative on arrival to the ED.  Complains of fever, chills, body aches, sore to his bottom which is being treated by the wound care center.  Reportedly he is currently on an antibiotic for this.  Denies associated headache, neck pain, chest pain, shortness of breath, abdominal pain, nausea, vomiting, diarrhea.  Makes some urine daily.  Denies recent travel or trauma.  Past Medical History:  Diagnosis Date  . Anemia   . Crohn disease ([place])   . Diabetes mellitus without complication ([place])   . DVT of lower extremity (deep venous thrombosis) ([place]) 2016  . Hidradenitis suppurativa   . Hypertension   . ICH (intracerebral hemorrhage) ([place])   . Renal disorder     Patient Active Problem List   Diagnosis Date Noted  . Encephalopathy 12/04/2017  . S/P BKA (below knee amputation) unilateral, right ([place]) 10/20/2017  . Atherosclerosis of native arteries of extremity with rest pain ([place]) 08/05/2017  . Empyema ([place]) 05/20/2017  . Peritonitis ([place]) 04/21/2017  . History of CVA (cerebrovascular accident) 04/15/2017  . Seizure ([place]) 04/15/2017  . End stage renal failure on dialysis ([place]) 04/12/2016  . Aphthae 02/20/2016  . Hidradenitis 02/20/2016  . Leg pain 02/20/2016  . Microalbuminuria 02/20/2016  . Neuropathy 02/20/2016  . Chest pain 02/20/2016  . Pyogenic arthritis  of knee ([place]) 02/04/2016  . Narrowing of intervertebral disc space 08/29/2015  . Vascular disorder of lower extremity 08/29/2015  . Failure of erection 08/29/2015  . Accumulation of fluid in tissues 08/29/2015  . Hypercholesteremia 08/29/2015  . Hypertension 08/29/2015  . Anemia due to chronic kidney disease 05/26/2015  . Venous insufficiency of leg 09/04/2014  . Deep vein thrombosis of lower extremity ([place]) 08/16/2014  . Prostatic intraepithelial neoplasia 11/02/2013  . Elevated prostate specific antigen (PSA) 09/11/2013  . Benign prostatic hyperplasia with urinary obstruction 08/13/2013  . Spermatocele 08/13/2013  . Avitaminosis D 01/25/2013  . Abnormal presence of protein in urine 03/28/2012  . Type 2 diabetes mellitus with kidney complication, with long-term current use of insulin ([place]) 03/28/2012  . Crohn's disease ([place]) 08/03/2011    Past Surgical History:  Procedure Laterality Date  . ABDOMINAL SURGERY    . BELOW KNEE LEG AMPUTATION Right 08/2017   [facility]  . KNEE SURGERY Left 02/04/2016   [facility]  . LOWER EXTREMITY ANGIOGRAPHY Right 08/08/2017   Procedure: Lower Extremity Angiography;  Surgeon: Algernon Huxley, MD;  Location: [place] CV [facility];  Service: Cardiovascular;  Laterality: Right;  . LOWER EXTREMITY ANGIOGRAPHY Right 08/22/2017   Procedure: Lower Extremity Angiography;  Surgeon: Algernon Huxley, MD;  Location: [place] [facility];  Service: Cardiovascular;  Laterality: Right;  . LOWER EXTREMITY INTERVENTION  08/08/2017   Procedure: LOWER EXTREMITY INTERVENTION;  Surgeon: Algernon Huxley, MD;  Location: [place] CV [facility];  Service: Cardiovascular;;  . LOWER EXTREMITY  INTERVENTION  08/22/2017   Procedure: LOWER EXTREMITY INTERVENTION;  Surgeon: Algernon Huxley, MD;  Location: [place] CV [facility];  Service: Cardiovascular;;    Prior to Admission medications   Medication Sig Start Date End Date Taking? Authorizing Provider  Alcohol Swabs PADS Use as directed to check blood sugar  three times daily for insulin dependent type 2 diabetes. 10/20/17  Yes [name], MD  amLODipine (NORVASC) 10 MG tablet Take 1 tablet (10 mg total) by mouth daily as needed. 06/01/17  Yes [name], MD  aspirin EC 81 MG tablet Take 81 mg by mouth daily.   Yes [provider]  atorvastatin (LIPITOR) 80 MG tablet Take 1 tablet (80 mg total) by mouth daily. 06/09/17  Yes [name], MD  calcitRIOL (ROCALTROL) 0.25 MCG capsule Take 0.5 mcg by mouth daily.  11/02/15  Yes [provider]  carvedilol (COREG) 25 MG tablet Take 12.5 mg by mouth 2 (two) times daily.  11/02/15  Yes [provider]  clopidogrel (PLAVIX) 75 MG tablet Take 1 tablet (75 mg total) by mouth daily. 08/08/17  Yes Dew, Erskine Squibb, MD  ferrous sulfate 325 (65 FE) MG tablet Take 1 tablet by mouth daily. 03/24/15  Yes [provider]  furosemide (LASIX) 80 MG tablet Take 1 tablet (80 mg total) by mouth 2 (two) times daily. 06/01/17  Yes [name], MD  gabapentin (NEURONTIN) 100 MG capsule Take 2 capsules (200 mg total) by mouth 2 (two) times daily. 10/20/17  Yes [name], MD  glucose blood (BAYER CONTOUR TEST) test strip Check sugar three times daily for insulin dependent diabetes 10/20/17  Yes [name], [name], MD  HYDROcodone-acetaminophen (NORCO/VICODIN) 5-325 MG tablet Take 1 tablet by mouth every 4 (four) hours as needed.   Yes [provider]  inFLIXimab (REMICADE) 100 MG injection Inject into the vein every 6 (six) weeks.    Yes [provider]  insulin aspart (NOVOLOG FLEXPEN) 100 UNIT/ML FlexPen Take 8-15 units three times a day before each meal 08/18/16  Yes [name], [name], MD  insulin glargine (LANTUS) 100 UNIT/ML injection Inject 0.4 mLs (40 Units total) into the skin daily at 10 pm. 04/25/17  Yes Epifanio Lesches, MD  Insulin Pen Needle 32G X 6 MM MISC Use to inject insulin via pen up to three times daily for insulin dependent type 2 diabetes. 10/20/17   Yes [name], MD  levETIRAcetam (KEPPRA) 1000 MG tablet Take 1,000 mg by mouth at bedtime.  04/20/17  Yes [provider]  lisinopril (PRINIVIL,ZESTRIL) 10 MG tablet Take 1 tablet by mouth daily. 08/20/17  Yes [provider]  mirtazapine (REMERON) 15 MG tablet Take 7.5 mg by mouth at bedtime.   Yes [provider]  nystatin (MYCOSTATIN) 100000 UNIT/ML suspension Take 5 mLs (500,000 Units total) by mouth 4 (four) times daily. 05/16/17  Yes [name], MD  polyethylene glycol [facility] / Floria Raveling) packet Take 1 packet by mouth as needed. 05/30/17  Yes [provider]  sevelamer carbonate (RENVELA) 800 MG tablet Take 1 tablet (800 mg total) by mouth 3 (three) times daily. 12/01/17 12/01/18 Yes [name], MD  sildenafil (VIAGRA) 100 MG tablet Take 1 tablet (100 mg total) by mouth daily as needed. 04/27/16  Yes [name], MD  sodium bicarbonate 650 MG tablet Take 650 mg by mouth 2 (two) times daily.   Yes [provider]  triamcinolone ointment (KENALOG) 0.1 % Apply  1 application topically as needed.  05/02/17  Yes [provider]    Allergies Vancomycin; Cefepime; Methotrexate; and Tape  Family History  Problem Relation Age of Onset  . Irritable bowel syndrome Sister   . Diabetes Sister   . Heart disease Mother   . Diabetes Mother   . Heart disease Father   . Rheumatic fever Father        as child  . Psoriasis Brother   . Arthritis Brother   . Diabetes Sister   . Diabetes Sister     Social History Social History   Tobacco Use  . Smoking status: Never Smoker  . Smokeless tobacco: Never Used  Substance Use Topics  . Alcohol use: No  . Drug use: Yes    Types: Marijuana    Review of Systems  Constitutional: Positive for fever/chills.  Positive for myalgias. Eyes: No visual changes. ENT: No sore throat. Cardiovascular: Denies chest pain. Respiratory: Denies shortness of breath. Gastrointestinal: No  abdominal pain.  No nausea, no vomiting.  No diarrhea.  No constipation. Genitourinary: Negative for dysuria. Musculoskeletal: Negative for back pain. Skin: Negative for rash. Neurological: Negative for headaches, focal weakness or numbness.   ____________________________________________   PHYSICAL EXAM:  VITAL SIGNS: ED Triage Vitals  Enc Vitals Group     BP      Pulse      Resp      Temp      Temp src      SpO2      Weight      Height      Head Circumference      Peak Flow      Pain Score      Pain Loc      Pain Edu?      Excl. in [place]?     Constitutional: Alert and oriented.  Chronically ill appearing and in mild acute distress. Eyes: Conjunctivae are normal. PERRL. EOMI. Head: Atraumatic. Nose: No congestion/rhinnorhea. Mouth/Throat: Mucous membranes are moist.  Oropharynx non-erythematous. Neck: No stridor.  Supple neck without meningismus. Cardiovascular: Normal rate, regular rhythm. Grossly normal heart sounds.  Good peripheral circulation. Respiratory: Normal respiratory effort.  No retractions. Lungs CTAB. Gastrointestinal: Soft and nontender to light or deep palpation. No distention. No abdominal bruits. No CVA tenderness. Musculoskeletal: Right BKA.  Neurologic: Alert and oriented x3.  CN II-XII grossly intact.  Normal speech and language. No gross focal neurologic deficits are appreciated.  Skin:  Skin is hot, dry and intact. No rash noted.  No petechiae.  Stage I sacral decubitus ulcer.  Small dry ulcer at the bottom of patient's right stump. Psychiatric: Mood and affect are normal. Speech and behavior are normal.  ____________________________________________   LABS (all labs ordered are listed, but only abnormal results are displayed)  Labs Reviewed  COMPREHENSIVE METABOLIC PANEL - Abnormal; Notable for the following components:      Result Value   Chloride 100 (*)    Glucose, Bld 200 (*)    BUN 44 (*)    Creatinine, Ser 6.95 (*)    Total Protein  8.8 (*)    AST 11 (*)    ALT 10 (*)    GFR calc non Af Amer 8 (*)    GFR calc Af Amer 9 (*)    Anion gap 16 (*)    All other components within normal limits  CBC WITH DIFFERENTIAL/PLATELET - Abnormal; Notable for the following components:   WBC 16.8 (*)  RBC 4.24 (*)    Hemoglobin 11.7 (*)    HCT 37.4 (*)    MCHC 31.2 (*)    RDW 17.6 (*)    Neutro Abs 15.4 (*)    Lymphs Abs 0.8 (*)    All other components within normal limits  TROPONIN I - Abnormal; Notable for the following components:   Troponin I 0.05 (*)    All other components within normal limits  BRAIN NATRIURETIC PEPTIDE - Abnormal; Notable for the following components:   B Natriuretic Peptide 177.0 (*)    All other components within normal limits  URINALYSIS, COMPLETE (UACMP) WITH MICROSCOPIC - Abnormal; Notable for the following components:   Color, Urine YELLOW (*)    APPearance HAZY (*)    Glucose, UA >=500 (*)    Protein, ur 100 (*)    Bacteria, UA RARE (*)    Squamous Epithelial / LPF 0-5 (*)    All other components within normal limits  CULTURE, BLOOD (ROUTINE X 2)  CULTURE, BLOOD (ROUTINE X 2)  URINE CULTURE  RESPIRATORY PANEL BY PCR  LACTIC ACID, PLASMA  INFLUENZA PANEL BY PCR (TYPE A & B)  PROTIME-INR  APTT  PROCALCITONIN   ____________________________________________  EKG  ED ECG REPORT I, Levell Tavano J, the attending physician, personally viewed and interpreted this ECG.   Date: 12/04/2017  EKG Time: 0117  Rate: 83  Rhythm: normal EKG, normal sinus rhythm  Axis: Normal  Intervals:none  ST&T Change: Nonspecific  ____________________________________________  RADIOLOGY  ED MD interpretation: No pneumonia  Official radiology report(s): Dg Chest Port 1 View  Result Date: 12/04/2017 CLINICAL DATA:  Fever.  Sepsis. EXAM: PORTABLE CHEST 1 VIEW COMPARISON:  Radiographs and CT August 2018 FINDINGS: Right-sided dialysis catheter with tips in the SVC. Heart size upper normal with normal  mediastinal contours. Small right pleural effusion versus right pleural thickening or scarring. No pulmonary edema. No confluent airspace disease. No pneumothorax. IMPRESSION: Small right pleural effusion or thickening/scarring. No focal consolidation. Electronically Signed   By: Jeb Levering M.D.   On: 12/04/2017 01:01    ____________________________________________   PROCEDURES  Procedure(s) performed: None  Procedures  Critical Care performed: No  ____________________________________________   INITIAL IMPRESSION / ASSESSMENT AND PLAN / ED COURSE  As part of my medical decision making, I reviewed the following data within the [place] notes reviewed and incorporated, Labs reviewed, EKG interpreted, Old chart reviewed, Radiograph reviewed, Discussed with admitting physician and Notes from prior ED visits.   57 year old male with complex medical history including ESRD on HD, CVA, diabetes, wound currently being treated at wound care center plus antibiotics, who presents with decreased level of consciousness and fever.  Differential diagnosis includes but is not limited to sepsis, influenza, wound infection, metabolic, etc.  ED code sepsis initiated upon patient's arrival.  Will be judicious with IV fluids given patient's renal failure status.  Patient did have full dialysis on Friday.  Clinical Course as of Dec 04 434  Sun Dec 04, 2017  3474 Patient afebrile.  Updated patient and family of laboratory, imaging and urinalysis results.  Discussed with hospitalist who will evaluate patient in emergency department for admission.  [JS]    Clinical Course User Index [JS] Paulette Blanch, MD     ____________________________________________   FINAL CLINICAL IMPRESSION(S) / ED DIAGNOSES  Final diagnoses:  Fever, unspecified fever cause  Sepsis, due to unspecified organism [facility])     ED Discharge Orders    None  Note:  This document was  prepared using Dragon voice recognition software and may include unintentional dictation errors.    Paulette Blanch, MD 12/04/17 [id number]

## 2017-12-04 NOTE — Progress Notes (Signed)
Pharmacy Antibiotic Note  [name] is a 57 y.o. male admitted on 12/04/2017 with sepsis.  Pharmacy has been consulted for aztreonam/levaquin, now switched to meropenem dosing. Patient admitted as a code sepsis and received one dose of aztreonam 2g and levaquin 750 mg IV x 1. Spoke w/ MD to broaden to meropenem and d/c levaquin, did also want to add Linezolid for broad gram positive coverage; however, that is restricted to ID.  Plan: Patient received aztreonam 2g and levaquin 750 mg IV x 1  Will switch to and start meropenem 500 mg IV q24h per hemodialysis dosing (to be given after dialysis on dialysis days) patient has HD on MWF. Consider adding broad gram positive coverage (patient allx to vanc -- was going to start linezolid, but restricted to ID)  Height: 5\' 11"  (180.3 cm) Weight: 177 lb (80.3 kg) IBW/kg (Calculated) : 75.3  Temp (24hrs), Avg:100.1 F (37.8 C), Min:98.5 F (36.9 C), Max:102.7 F (39.3 C)  Recent Labs  Lab 12/04/17 0042 12/04/17 0517  WBC 16.8* 14.0*  CREATININE 6.95* 7.10*  LATICACIDVEN 1.4  --     Estimated Creatinine Clearance: 12.5 mL/min (A) (by C-G formula based on SCr of 7.1 mg/dL (H)).    Allergies  Allergen Reactions  . Vancomycin Shortness Of Breath    Eyes watering, SOB, wheezing  . Cefepime Other (See Comments)  . Methotrexate Other (See Comments)    Blood count drops  . Tape     Thank you for allowing pharmacy to be a part of this patient's care.  Tobie Lords, PharmD, BCPS Clinical Pharmacist 12/04/2017

## 2017-12-04 NOTE — ED Notes (Signed)
Code Sepsis called into [facility]

## 2017-12-04 NOTE — ED Notes (Signed)
RN aware of ready bed

## 2017-12-04 NOTE — H&P (Signed)
[facility] NAME: [name]    MR#:  [id number]  DATE OF BIRTH:  04-28-1961  DATE OF ADMISSION:  12/04/2017  PRIMARY CARE PHYSICIAN: [name], MD   REQUESTING/REFERRING PHYSICIAN:   CHIEF COMPLAINT:   Chief Complaint  Patient presents with  . Fever    HISTORY OF PRESENT ILLNESS: [name]  is a 57 y.o. male with a known history per below, presenting from home with acute fever of 103, altered mental status, more confused, less responsive, chills, diffuse body aches, rhinorrhea, brought to the emergency room via EMS, most of the information is coming from the patient's wife who is a good historian, patient is poor historian-noted history of stroke, emergency room patient is febrile, tachypneic, leukocytosis of 16,000, chest x-ray noted for small right pleural effusion, urinalysis negative, lactic acid normal, creatinine 6.9-typically receives dialysis on Mondays/Wednesdays/Fridays, code sepsis called in the emergency room/initiated, started on empiric antibiotics, patient evaluated at the bedside with family, patient is now on his neurological baseline discussion with the patient's wife, patient is now been admitted for acute encephalopathy most likely secondary to acute febrile illness of unknown etiology.  PAST MEDICAL HISTORY:   Past Medical History:  Diagnosis Date  . Anemia   . Crohn disease ([place])   . Diabetes mellitus without complication ([place])   . DVT of lower extremity (deep venous thrombosis) ([place]) 2016  . Hidradenitis suppurativa   . Hypertension   . ICH (intracerebral hemorrhage) ([place])   . Renal disorder     PAST SURGICAL HISTORY:  Past Surgical History:  Procedure Laterality Date  . ABDOMINAL SURGERY    . BELOW KNEE LEG AMPUTATION Right 08/2017   [facility]  . KNEE SURGERY Left 02/04/2016   [facility]  . LOWER EXTREMITY ANGIOGRAPHY Right 08/08/2017   Procedure: Lower Extremity Angiography;  Surgeon: Algernon Huxley,  MD;  Location: [facility];  Service: Cardiovascular;  Laterality: Right;  . LOWER EXTREMITY ANGIOGRAPHY Right 08/22/2017   Procedure: Lower Extremity Angiography;  Surgeon: Algernon Huxley, MD;  Location: [facility];  Service: Cardiovascular;  Laterality: Right;  . LOWER EXTREMITY INTERVENTION  08/08/2017   Procedure: LOWER EXTREMITY INTERVENTION;  Surgeon: Algernon Huxley, MD;  Location: [facility];  Service: Cardiovascular;;  . LOWER EXTREMITY INTERVENTION  08/22/2017   Procedure: LOWER EXTREMITY INTERVENTION;  Surgeon: Algernon Huxley, MD;  Location: [facility];  Service: Cardiovascular;;    SOCIAL HISTORY:  Social History   Tobacco Use  . Smoking status: Never Smoker  . Smokeless tobacco: Never Used  Substance Use Topics  . Alcohol use: No    FAMILY HISTORY:  Family History  Problem Relation Age of Onset  . Irritable bowel syndrome Sister   . Diabetes Sister   . Heart disease Mother   . Diabetes Mother   . Heart disease Father   . Rheumatic fever Father        as child  . Psoriasis Brother   . Arthritis Brother   . Diabetes Sister   . Diabetes Sister     DRUG ALLERGIES:  Allergies  Allergen Reactions  . Vancomycin Shortness Of Breath    Eyes watering, SOB, wheezing  . Cefepime Other (See Comments)  . Methotrexate Other (See Comments)    Blood count drops  . Tape     REVIEW OF SYSTEMS:   CONSTITUTIONAL: +No fever, fatigue, gen. weakness.  EYES: No blurred or  double vision.  EARS, NOSE, AND THROAT: No tinnitus or ear pain. + Rhinorrhea RESPIRATORY: No cough, shortness of breath, wheezing or hemoptysis.  CARDIOVASCULAR: No chest pain, orthopnea, edema.  GASTROINTESTINAL: No nausea, vomiting, diarrhea or abdominal pain.  GENITOURINARY: No dysuria, hematuria.  ENDOCRINE: No polyuria, nocturia,  HEMATOLOGY: No anemia, easy bruising or bleeding SKIN: No rash or lesion.  Chronic stage I-II sacral decubitus wound, healed right stump  wound MUSCULOSKELETAL: No joint pain or arthritis.   NEUROLOGIC: No tingling, numbness, weakness.  PSYCHIATRY: No anxiety or depression.   MEDICATIONS AT HOME:  Prior to Admission medications   Medication Sig Start Date End Date Taking? Authorizing Provider  Alcohol Swabs PADS Use as directed to check blood sugar three times daily for insulin dependent type 2 diabetes. 10/20/17  Yes [name], MD  amLODipine (NORVASC) 10 MG tablet Take 1 tablet (10 mg total) by mouth daily as needed. 06/01/17  Yes [name], MD  aspirin EC 81 MG tablet Take 81 mg by mouth daily.   Yes [provider]  atorvastatin (LIPITOR) 80 MG tablet Take 1 tablet (80 mg total) by mouth daily. 06/09/17  Yes [name], MD  calcitRIOL (ROCALTROL) 0.25 MCG capsule Take 0.5 mcg by mouth daily.  11/02/15  Yes [provider]  carvedilol (COREG) 25 MG tablet Take 12.5 mg by mouth 2 (two) times daily.  11/02/15  Yes [provider]  clopidogrel (PLAVIX) 75 MG tablet Take 1 tablet (75 mg total) by mouth daily. 08/08/17  Yes Dew, Erskine Squibb, MD  ferrous sulfate 325 (65 FE) MG tablet Take 1 tablet by mouth daily. 03/24/15  Yes [provider]  furosemide (LASIX) 80 MG tablet Take 1 tablet (80 mg total) by mouth 2 (two) times daily. 06/01/17  Yes [name], MD  gabapentin (NEURONTIN) 100 MG capsule Take 2 capsules (200 mg total) by mouth 2 (two) times daily. 10/20/17  Yes [name], MD  glucose blood (BAYER CONTOUR TEST) test strip Check sugar three times daily for insulin dependent diabetes 10/20/17  Yes [name], [name], MD  HYDROcodone-acetaminophen (NORCO/VICODIN) 5-325 MG tablet Take 1 tablet by mouth every 4 (four) hours as needed.   Yes [provider]  inFLIXimab (REMICADE) 100 MG injection Inject into the vein every 6 (six) weeks.    Yes [provider]  insulin aspart (NOVOLOG FLEXPEN) 100 UNIT/ML FlexPen Take 8-15 units three times a day before each meal  08/18/16  Yes [name], [name], MD  insulin glargine (LANTUS) 100 UNIT/ML injection Inject 0.4 mLs (40 Units total) into the skin daily at 10 pm. 04/25/17  Yes Epifanio Lesches, MD  Insulin Pen Needle 32G X 6 MM MISC Use to inject insulin via pen up to three times daily for insulin dependent type 2 diabetes. 10/20/17  Yes [name], MD  levETIRAcetam (KEPPRA) 1000 MG tablet Take 1,000 mg by mouth at bedtime.  04/20/17  Yes [provider]  lisinopril (PRINIVIL,ZESTRIL) 10 MG tablet Take 1 tablet by mouth daily. 08/20/17  Yes [provider]  mirtazapine (REMERON) 15 MG tablet Take 7.5 mg by mouth at bedtime.   Yes [provider]  nystatin (MYCOSTATIN) 100000 UNIT/ML suspension Take 5 mLs (500,000 Units total) by mouth 4 (four) times daily. 05/16/17  Yes [name], MD  polyethylene glycol [facility] / Floria Raveling) packet Take 1 packet by mouth as needed. 05/30/17  Yes [provider]  sevelamer carbonate (RENVELA) 800 MG tablet  Take 1 tablet (800 mg total) by mouth 3 (three) times daily. 12/01/17 12/01/18 Yes [name], MD  sildenafil (VIAGRA) 100 MG tablet Take 1 tablet (100 mg total) by mouth daily as needed. 04/27/16  Yes [name], MD  sodium bicarbonate 650 MG tablet Take 650 mg by mouth 2 (two) times daily.   Yes [provider]  triamcinolone ointment (KENALOG) 0.1 % Apply 1 application topically as needed.  05/02/17  Yes [provider]      PHYSICAL EXAMINATION:   VITAL SIGNS: Blood pressure 112/66, pulse 79, temperature 99.1 F (37.3 C), temperature source Oral, resp. rate 18, height 5\' 11"  (1.803 m), weight 80.7 kg (178 lb), SpO2 95 %.  GENERAL:  57 y.o.-year-old patient lying in the bed with no acute distress.  EYES: Pupils equal, round, reactive to light and accommodation. No scleral icterus. Extraocular muscles intact.  HEENT: Head atraumatic, normocephalic. Oropharynx and nasopharynx clear.  NECK:  Supple, no  jugular venous distention. No thyroid enlargement, no tenderness.  LUNGS: Normal breath sounds bilaterally, no wheezing, rales,rhonchi or crepitation. No use of accessory muscles of respiration.  CARDIOVASCULAR: S1, S2 normal. No murmurs, rubs, or gallops.  ABDOMEN: Soft, nontender, nondistended. Bowel sounds present. No organomegaly or mass.  EXTREMITIES: No pedal edema, cyanosis, or clubbing.  NEUROLOGIC: Cranial nerves II through XII are intact. MAES. Gait not checked.  PSYCHIATRIC: The patient is alert and oriented x 3.  SKIN: No obvious rash, lesion,+ stage I/II sacral decubitus wound, healed right stump wound   LABORATORY PANEL:   CBC Recent Labs  Lab 12/04/17 0042  WBC 16.8*  HGB 11.7*  HCT 37.4*  PLT 283  MCV 88.2  MCH 27.6  MCHC 31.2*  RDW 17.6*  LYMPHSABS 0.8*  MONOABS 0.5  EOSABS 0.0  BASOSABS 0.0   ------------------------------------------------------------------------------------------------------------------  Chemistries  Recent Labs  Lab 12/01/17 1158 12/04/17 0042  NA  --  139  K  --  4.1  CL  --  100*  CO2  --  23  GLUCOSE  --  200*  BUN  --  44*  CREATININE  --  6.95*  CALCIUM  --  9.3  AST 9 11*  ALT 6 10*  ALKPHOS 89 87  BILITOT 0.2 0.5   ------------------------------------------------------------------------------------------------------------------ estimated creatinine clearance is 12.8 mL/min (A) (by C-G formula based on SCr of 6.95 mg/dL (H)). ------------------------------------------------------------------------------------------------------------------ No results for input(s): TSH, T4TOTAL, T3FREE, THYROIDAB in the last 72 hours.  Invalid input(s): FREET3   Coagulation profile No results for input(s): INR, PROTIME in the last 168 hours. ------------------------------------------------------------------------------------------------------------------- No results for input(s): DDIMER in the last 72  hours. -------------------------------------------------------------------------------------------------------------------  Cardiac Enzymes Recent Labs  Lab 12/04/17 0042  TROPONINI 0.05*   ------------------------------------------------------------------------------------------------------------------ Invalid input(s): POCBNP  ---------------------------------------------------------------------------------------------------------------  Urinalysis    Component Value Date/Time   COLORURINE YELLOW (A) 12/04/2017 0240   APPEARANCEUR HAZY (A) 12/04/2017 0240   LABSPEC 1.011 12/04/2017 0240   PHURINE 6.0 12/04/2017 0240   GLUCOSEU >=500 (A) 12/04/2017 0240   HGBUR NEGATIVE 12/04/2017 0240   BILIRUBINUR NEGATIVE 12/04/2017 0240   KETONESUR NEGATIVE 12/04/2017 0240   PROTEINUR 100 (A) 12/04/2017 0240   NITRITE NEGATIVE 12/04/2017 0240   LEUKOCYTESUR NEGATIVE 12/04/2017 0240     RADIOLOGY: Dg Chest Port 1 View  Result Date: 12/04/2017 CLINICAL DATA:  Fever.  Sepsis. EXAM: PORTABLE CHEST 1 VIEW COMPARISON:  Radiographs and CT August 2018 FINDINGS: Right-sided dialysis catheter with tips in the SVC. Heart size upper normal with normal  mediastinal contours. Small right pleural effusion versus right pleural thickening or scarring. No pulmonary edema. No confluent airspace disease. No pneumothorax. IMPRESSION: Small right pleural effusion or thickening/scarring. No focal consolidation. Electronically Signed   By: Jeb Levering M.D.   On: 12/04/2017 01:01    EKG: Orders placed or performed during the hospital encounter of 12/04/17  . ED EKG 12-Lead  . ED EKG 12-Lead  . EKG 12-Lead  . EKG 12-Lead    IMPRESSION AND PLAN: 1 acute febrile illness of unknown etiology with SIRs Influenza negative, chest x-ray noted for small right pleural effusion, leukocytosis of 16,000, fever of 103 at home-102.7 in the emergency room, tachypnea, UA negative, noted rhinorrhea Possible etiologies  still include acute influenza or other viral upper respiratory tract infection, acute bacterial infection of unknown source, acute line infection Admit to regular nursing floor bed on our sepsis protocol, continue empiric Levaquin/aztreonam given allergies, follow-up on cultures, Tamiflu twice daily for 5-day course, check viral respiratory panel, Tylenol as needed fevers, gentle IV fluids for rehydration given end-stage renal disease, and continue close medical monitoring   2 chronic stage I/II sacral decubitus wound  Appears noninfected  Local wound care   3 chronic end-stage renal disease  On hemodialysis Mondays/Wednesdays/Fridays  Noted right chest temporary hemodialysis catheter in place, no arm fistula, prior history of peritoneal dialysis  Nephrology consulted for hemodialysis needs   4 chronic diabetes mellitus type 2  Stable  Sliding scale insulin with Accu-Cheks per routine   5 chronic benign essential hypertension Stable Continue home regiment  6 acute encephalopathy Most likely secondary to acute febrile illness, noted history of strokes hold sedating agents, neurochecks per routine , Check ammonia level, RPR, and continue close medical monitoring    All the records are reviewed and case discussed with ED provider. Management plans discussed with the patient, family and they are in agreement.  CODE STATUS: Code Status History    Date Active Date Inactive Code Status Order ID Comments User Context   08/22/2017 12:44 08/22/2017 18:00 Full Code [id number]  Algernon Huxley, MD Inpatient   04/21/2017 18:19 04/25/2017 19:15 Full Code [id number]  Dustin Flock, MD Inpatient   11/06/2015 20:35 11/07/2015 18:18 Full Code [id number]  Aldean Jewett, MD ED       TOTAL TIME TAKING CARE OF THIS PATIENT: 45 minutes.    Avel Peace Braylynn Lewing M.D on 12/04/2017   Between 7am to 6pm - Pager - [phone]  After 6pm go to [email or web address] - password [email or web address] [facility]   Office  [phone]  CC: Primary care physician; [name], MD   Note: This dictation was prepared with Dragon dictation along with smaller phrase technology. Any transcriptional errors that result from this process are unintentional.

## 2017-12-04 NOTE — Progress Notes (Signed)
CODE SEPSIS - PHARMACY COMMUNICATION  **Broad Spectrum Antibiotics should be administered within 1 hour of Sepsis diagnosis**  Time Code Sepsis Called/Page Received: 0041  Antibiotics Ordered: levaquin/merrem  Time of 1st antibiotic administration: 0332  Additional action taken by pharmacy:   If necessary, Name of Provider/Nurse Contacted:     Tobie Lords ,PharmD Clinical Pharmacist  12/04/2017  3:43 AM

## 2017-12-04 NOTE — Progress Notes (Signed)
[facility]  ROUNDING NOTE   Subjective:  Patient known to [place] from prior admissions. Normally he is followed by [facility] nephrology. He undergoes dialysis on Monday, Wednesday, Friday. Patient brought in by his wife for fever. His influenza screen was found to be negative. He has been dosed with aztreonam as well as Levaquin. Patient has allergy to vancomycin.   Objective:  Vital signs in last 24 hours:  Temp:  [98.5 F (36.9 C)-102.7 F (39.3 C)] 98.5 F (36.9 C) (02/17 0519) Pulse Rate:  [68-86] 68 (02/17 0519) Resp:  [16-22] 16 (02/17 0519) BP: (112-139)/(64-76) 137/72 (02/17 0953) SpO2:  [95 %-98 %] 97 % (02/17 0519) Weight:  [80.3 kg (177 lb)-80.7 kg (178 lb)] 80.3 kg (177 lb) (02/17 0519)  Weight change:  Filed Weights   12/04/17 0040 12/04/17 0519  Weight: 80.7 kg (178 lb) 80.3 kg (177 lb)    Intake/Output: No intake/output data recorded.   Intake/Output this shift:  Total I/O In: 240 [P.O.:240] Out: -   Physical Exam: General: No acute distress  Head: Normocephalic, atraumatic. Moist oral mucosal membranes  Eyes: Anicteric  Neck: Supple, trachea midline  Lungs:  Clear to auscultation, normal effort  Heart: S1S2 no rubs  Abdomen:  Soft, nontender, bowel sounds present  Extremities: R BKA, trace LE edema  Neurologic: Awake, alert, following commands  Skin: No lesions  Access: R IJ permcath, no drainage from catheter    Basic Metabolic Panel: Recent Labs  Lab 12/04/17 0042 12/04/17 0517  NA 139  --   K 4.1  --   CL 100*  --   CO2 23  --   GLUCOSE 200*  --   BUN 44*  --   CREATININE 6.95* 7.10*  CALCIUM 9.3  --     Liver Function Tests: Recent Labs  Lab 12/01/17 1158 12/04/17 0042  AST 9 11*  ALT 6 10*  ALKPHOS 89 87  BILITOT 0.2 0.5  PROT 7.3 8.8*  ALBUMIN 3.8 3.9   No results for input(s): LIPASE, AMYLASE in the last 168 hours. Recent Labs  Lab 12/04/17 0517  AMMONIA 29    CBC: Recent Labs  Lab 12/04/17 0042  12/04/17 0517  WBC 16.8* 14.0*  NEUTROABS 15.4*  --   HGB 11.7* 10.1*  HCT 37.4* 32.2*  MCV 88.2 89.0  PLT 283 242    Cardiac Enzymes: Recent Labs  Lab 12/04/17 0042  TROPONINI 0.05*    BNP: Invalid input(s): POCBNP  CBG: Recent Labs  Lab 12/04/17 0729 12/04/17 1215  GLUCAP 220* 243*    Microbiology: Results for orders placed or performed during the hospital encounter of 12/04/17  Blood Culture (routine x 2)     Status: None (Preliminary result)   Collection Time: 12/04/17 12:42 AM  Result Value Ref Range Status   Specimen Description BLOOD RIGHT FOREARM  Final   Special Requests   Final    BOTTLES DRAWN AEROBIC AND ANAEROBIC Blood Culture adequate volume   Culture   Final    NO GROWTH < 12 HOURS Performed at [facility], [address] [place], [place] [address]    Report Status PENDING  Incomplete  Blood Culture (routine x 2)     Status: None (Preliminary result)   Collection Time: 12/04/17 12:42 AM  Result Value Ref Range Status   Specimen Description BLOOD RIGHT WRIST  Final   Special Requests   Final    BOTTLES DRAWN AEROBIC AND ANAEROBIC Blood Culture adequate volume  Culture   Final    NO GROWTH < 12 HOURS Performed at [facility], [address] [place], [place] [address]    Report Status PENDING  Incomplete  MRSA PCR Screening     Status: None   Collection Time: 12/04/17  5:57 AM  Result Value Ref Range Status   MRSA by PCR NEGATIVE NEGATIVE Final    Comment:        The GeneXpert MRSA Assay (FDA approved for NASAL specimens only), is one component of a comprehensive MRSA colonization surveillance program. It is not intended to diagnose MRSA infection nor to guide or monitor treatment for MRSA infections. Performed at [facility], [address] [place], [place] [address]     Coagulation Studies: Recent Labs    12/04/17 0517  LABPROT 14.3  INR 1.12    Urinalysis: Recent Labs    12/04/17 0240   COLORURINE YELLOW*  LABSPEC 1.011  PHURINE 6.0  GLUCOSEU >=500*  HGBUR NEGATIVE  BILIRUBINUR NEGATIVE  KETONESUR NEGATIVE  PROTEINUR 100*  NITRITE NEGATIVE  LEUKOCYTESUR NEGATIVE      Imaging: Dg Chest Port 1 View  Result Date: 12/04/2017 CLINICAL DATA:  Fever.  Sepsis. EXAM: PORTABLE CHEST 1 VIEW COMPARISON:  Radiographs and CT August 2018 FINDINGS: Right-sided dialysis catheter with tips in the SVC. Heart size upper normal with normal mediastinal contours. Small right pleural effusion versus right pleural thickening or scarring. No pulmonary edema. No confluent airspace disease. No pneumothorax. IMPRESSION: Small right pleural effusion or thickening/scarring. No focal consolidation. Electronically Signed   By: Jeb Levering M.D.   On: 12/04/2017 01:01     Medications:   . sodium chloride    . DAPTOmycin (CUBICIN)  IV    . [START ON 12/05/2017] meropenem (MERREM) IV     . amLODipine  5 mg Oral Daily  . aspirin EC  81 mg Oral Daily  . atorvastatin  80 mg Oral Daily  . calcitRIOL  0.5 mcg Oral Daily  . carvedilol  12.5 mg Oral BID  . clopidogrel  75 mg Oral Daily  . docusate sodium  100 mg Oral BID  . ferrous sulfate  325 mg Oral Daily  . furosemide  80 mg Oral BID  . heparin  5,000 Units Subcutaneous Q8H  . insulin aspart  0-15 Units Subcutaneous TID WC  . insulin aspart  0-15 Units Subcutaneous TID WC  . insulin aspart  0-5 Units Subcutaneous QHS  . insulin aspart  0-5 Units Subcutaneous QHS  . insulin aspart  8 Units Subcutaneous TID WC  . insulin glargine  40 Units Subcutaneous Q2200  . levETIRAcetam  1,000 mg Oral QHS  . lisinopril  10 mg Oral Daily  . mirtazapine  7.5 mg Oral QHS  . sevelamer carbonate  800 mg Oral TID WC  . sodium bicarbonate  650 mg Oral BID  . sodium chloride flush  3 mL Intravenous Q12H   sodium chloride, acetaminophen **OR** acetaminophen, ondansetron **OR** ondansetron (ZOFRAN) IV, polyethylene glycol, sodium chloride flush,  triamcinolone ointment  Assessment/ Plan:  57 y.o. male with past medical history of ESRD on HD MWF followed by [facility] nephrology, diabetes mellitus type 2, history of DVT, right below the knee amputation, hypertension, history of intracerebral hemorrhage, anemia of chronic kidney disease, secondary hyperparathyroidism, seizure disorder who presents now with fever.  [facility] nephrology/[address]/MWF.  1.  ESRD on HD MWF.  Patient did undergo dialysis on Friday.  No urgent indication for dialysis today.  We will plan for dialysis again tomorrow using his right internal jugular PermCath.  2.  Fever.  Influenza screen was found to be negative.  Chest x-ray negative for obvious pneumonia.  Right internal jugular PermCath was inspected and no drainage from the tract noted.  We recommend some gram-positive cocci coverage.  Therefore I discussed the case with pharmacy and daptomycin will be added empirically for now as he has a vancomycin allergy.  3.  Anemia of chronic kidney disease.  Hemoglobin currently 10.1.  Hold off on Epogen for now.  4.  Secondary hyperparathyroidism.  Check intact PTH and phosphorus with next dialysis treatment.  Maintain the patient on Renvela 800 mg's p.o. 3 times daily.   LOS: 0 Geonna Lockyer 2/17/20191:17 PM

## 2017-12-04 NOTE — Progress Notes (Signed)
Pharmacy Antibiotic Note  [name] is a 57 y.o. male admitted on 12/04/2017 with sepsis.  Pharmacy has been consulted for aztreonam/levaquin, now switched to meropenem dosing. Patient admitted as a code sepsis and received one dose of aztreonam 2g and levaquin 750 mg IV x 1.   2/17 Per Dr Holley Raring patient needs Gram positive coverage. Pharmacy now consulted for daptomycin dosing.   Plan: Continue  meropenem 500 mg IV q24h per hemodialysis dosing (to be given after dialysis on dialysis days) patient has HD on MWF.  Start Daptomycin  6 mg/kg IV once every 48 hours following hemodialysis on hemodialysis days.     Height: 5\' 11"  (180.3 cm) Weight: 177 lb (80.3 kg) IBW/kg (Calculated) : 75.3  Temp (24hrs), Avg:100.1 F (37.8 C), Min:98.5 F (36.9 C), Max:102.7 F (39.3 C)  Recent Labs  Lab 12/04/17 0042 12/04/17 0517  WBC 16.8* 14.0*  CREATININE 6.95* 7.10*  LATICACIDVEN 1.4  --     Estimated Creatinine Clearance: 12.5 mL/min (A) (by C-G formula based on SCr of 7.1 mg/dL (H)).    Allergies  Allergen Reactions  . Vancomycin Shortness Of Breath    Eyes watering, SOB, wheezing  . Cefepime Other (See Comments)  . Methotrexate Other (See Comments)    Blood count drops  . Tape     Thank you for allowing pharmacy to be a part of this patient's care.  Tobie Lords, PharmD, BCPS Clinical Pharmacist 12/04/2017

## 2017-12-04 NOTE — ED Triage Notes (Signed)
Patient presents to Emergency Department via EMS with complaints of fever.  Pt is DM and has right chest port (M W F dialysis), right BTK amputation with the year, and pt is being treated at the wound center for decubitus ulcer on sacrum.  EMS reports pt is hot and dry.

## 2017-12-04 NOTE — ED Notes (Signed)
Date and time results received: 12/04/17 0141 (use smartphrase ".now" to insert current time)  Test: Troponin Critical Value: 0.05  Name of Provider Notified: Beather Arbour

## 2017-12-05 DIAGNOSIS — L89151 Pressure ulcer of sacral region, stage 1: Secondary | ICD-10-CM

## 2017-12-05 LAB — RESPIRATORY PANEL BY PCR

## 2017-12-05 LAB — URINE CULTURE: Culture: NO GROWTH

## 2017-12-05 LAB — GLUCOSE, CAPILLARY
Glucose-Capillary: 110 mg/dL — ABNORMAL HIGH (ref 65–99)
Glucose-Capillary: 108 mg/dL — ABNORMAL HIGH (ref 65–99)
Glucose-Capillary: 96 mg/dL (ref 65–99)
Glucose-Capillary: 108 mg/dL — ABNORMAL HIGH (ref 65–99)

## 2017-12-05 LAB — RPR: RPR Ser Ql: NONREACTIVE

## 2017-12-05 NOTE — Progress Notes (Signed)
Patient ID: [name], male   DOB: 12-22-1960, 57 y.o.   MRN: [id number]  [facility] PROGRESS NOTE  Takahiro Godinho Colin HDQ:[id number] DOB: October 25, 1960 DOA: 12/04/2017 PCP: [name], MD  HPI/Subjective: Patient feeling better.  He stated he had a fever and chill and then he did not recall anything else that happened.  Patient denies any other symptoms.  He states that he does have some boils that caused him some issues.  Objective: Vitals:   12/05/17 0859 12/05/17 1230  BP: (!) 158/74 132/68  Pulse: 61 63  Resp:  18  Temp:  98.2 F (36.8 C)  SpO2:  98%    Filed Weights   12/04/17 0040 12/04/17 0519  Weight: 80.7 kg (178 lb) 80.3 kg (177 lb)    ROS: Review of Systems  Constitutional: Negative for chills and fever.  Eyes: Negative for blurred vision.  Respiratory: Negative for cough and shortness of breath.   Cardiovascular: Negative for chest pain.  Gastrointestinal: Negative for abdominal pain, constipation, diarrhea, nausea and vomiting.  Genitourinary: Negative for dysuria.  Musculoskeletal: Negative for joint pain.  Neurological: Negative for dizziness and headaches.   Exam: Physical Exam  Constitutional: He is oriented to person, place, and time.  HENT:  Nose: No mucosal edema.  Mouth/Throat: No oropharyngeal exudate or posterior oropharyngeal edema.  Eyes: Conjunctivae, EOM and lids are normal. Pupils are equal, round, and reactive to light.  Neck: No JVD present. Carotid bruit is not present. No edema present. No thyroid mass and no thyromegaly present.  Cardiovascular: S1 normal and S2 normal. Exam reveals no gallop.  No murmur heard. Pulses:      Dorsalis pedis pulses are 2+ on the right side, and 2+ on the left side.  Respiratory: No respiratory distress. He has no wheezes. He has no rhonchi. He has no rales.  GI: Soft. Bowel sounds are normal. There is no tenderness.  Musculoskeletal:       Right ankle: He exhibits no swelling.       Left  ankle: He exhibits no swelling.  Lymphadenopathy:    He has no cervical adenopathy.  Neurological: He is alert and oriented to person, place, and time. No cranial nerve deficit.  Skin: Skin is warm. Nails show no clubbing.  Right groin looks like some chronic changes and some scarring there but he does have an area of fluctuance that I was able to squeeze out some pus.  This was sent off for culture.  Psychiatric: He has a normal mood and affect.      Data Reviewed: Basic Metabolic Panel: Recent Labs  Lab 12/04/17 0042 12/04/17 0517  NA 139  --   K 4.1  --   CL 100*  --   CO2 23  --   GLUCOSE 200*  --   BUN 44*  --   CREATININE 6.95* 7.10*  CALCIUM 9.3  --    Liver Function Tests: Recent Labs  Lab 12/01/17 1158 12/04/17 0042  AST 9 11*  ALT 6 10*  ALKPHOS 89 87  BILITOT 0.2 0.5  PROT 7.3 8.8*  ALBUMIN 3.8 3.9    Recent Labs  Lab 12/04/17 0517  AMMONIA 29   CBC: Recent Labs  Lab 12/04/17 0042 12/04/17 0517  WBC 16.8* 14.0*  NEUTROABS 15.4*  --   HGB 11.7* 10.1*  HCT 37.4* 32.2*  MCV 88.2 89.0  PLT 283 242   Cardiac Enzymes: Recent Labs  Lab 12/04/17 0042  TROPONINI 0.05*  BNP (last 3 results) Recent Labs    05/19/17 1522 12/04/17 0042  BNP 157.0* 177.0*    CBG: Recent Labs  Lab 12/04/17 1215 12/04/17 1704 12/04/17 2121 12/05/17 0737 12/05/17 1202  GLUCAP 243* 164* 104* 110* 108*    Recent Results (from the past 240 hour(s))  Blood Culture (routine x 2)     Status: None (Preliminary result)   Collection Time: 12/04/17 12:42 AM  Result Value Ref Range Status   Specimen Description BLOOD RIGHT FOREARM  Final   Special Requests   Final    BOTTLES DRAWN AEROBIC AND ANAEROBIC Blood Culture adequate volume   Culture   Final    NO GROWTH 1 DAY Performed at [facility], [address] [place], [place] [address]    Report Status PENDING  Incomplete  Blood Culture (routine x 2)     Status: None (Preliminary result)    Collection Time: 12/04/17 12:42 AM  Result Value Ref Range Status   Specimen Description BLOOD RIGHT WRIST  Final   Special Requests   Final    BOTTLES DRAWN AEROBIC AND ANAEROBIC Blood Culture adequate volume   Culture   Final    NO GROWTH 1 DAY Performed at [facility], [address], [place], [place] [address]    Report Status PENDING  Incomplete  Urine culture     Status: None   Collection Time: 12/04/17  2:40 AM  Result Value Ref Range Status   Specimen Description   Final    URINE, RANDOM Performed at [facility], [address], [place], [place] [address]    Special Requests   Final    NONE Performed at [facility], [address] [place], [place] [address]    Culture   Final    NO GROWTH Performed at [facility], [place] [address], [place], [place] [address]    Report Status 12/05/2017 FINAL  Final  Respiratory Panel by PCR     Status: None   Collection Time: 12/04/17  3:40 AM  Result Value Ref Range Status   Adenovirus NOT DETECTED NOT DETECTED Final   Coronavirus 229E NOT DETECTED NOT DETECTED Final   Coronavirus HKU1 NOT DETECTED NOT DETECTED Final   Coronavirus NL63 NOT DETECTED NOT DETECTED Final   Coronavirus OC43 NOT DETECTED NOT DETECTED Final   Metapneumovirus NOT DETECTED NOT DETECTED Final   Rhinovirus / Enterovirus NOT DETECTED NOT DETECTED Final   Influenza A NOT DETECTED NOT DETECTED Final   Influenza B NOT DETECTED NOT DETECTED Final   Parainfluenza Virus 1 NOT DETECTED NOT DETECTED Final   Parainfluenza Virus 2 NOT DETECTED NOT DETECTED Final   Parainfluenza Virus 3 NOT DETECTED NOT DETECTED Final   Parainfluenza Virus 4 NOT DETECTED NOT DETECTED Final   Respiratory Syncytial Virus NOT DETECTED NOT DETECTED Final   Bordetella pertussis NOT DETECTED NOT DETECTED Final   Chlamydophila pneumoniae NOT DETECTED NOT DETECTED Final   Mycoplasma pneumoniae NOT DETECTED NOT DETECTED Final    Comment: Performed at  [facility], [place] [address], [place],  [address]  MRSA PCR Screening     Status: None   Collection Time: 12/04/17  5:57 AM  Result Value Ref Range Status   MRSA by PCR NEGATIVE NEGATIVE Final    Comment:        The GeneXpert MRSA Assay (FDA approved for NASAL specimens only), is one component of a comprehensive MRSA colonization surveillance program. It is not  intended to diagnose MRSA infection nor to guide or monitor treatment for MRSA infections. Performed at [facility], [address] [place], [place] [address]   Aerobic/Anaerobic Culture (surgical/deep wound)     Status: None (Preliminary result)   Collection Time: 12/05/17 10:11 AM  Result Value Ref Range Status   Specimen Description   Final    GROIN RIGHT Performed at [facility], [place] [address] [place], [place] [address]    Special Requests   Final    Immunocompromised Performed at [facility], [address] [place], [place] [address]    Gram Stain PENDING  Incomplete   Culture PENDING  Incomplete   Report Status PENDING  Incomplete     Studies: Dg Chest Port 1 View  Result Date: 12/04/2017 CLINICAL DATA:  Fever.  Sepsis. EXAM: PORTABLE CHEST 1 VIEW COMPARISON:  Radiographs and CT August 2018 FINDINGS: Right-sided dialysis catheter with tips in the SVC. Heart size upper normal with normal mediastinal contours. Small right pleural effusion versus right pleural thickening or scarring. No pulmonary edema. No confluent airspace disease. No pneumothorax. IMPRESSION: Small right pleural effusion or thickening/scarring. No focal consolidation. Electronically Signed   By: Jeb Levering M.D.   On: 12/04/2017 01:01    Scheduled Meds: . amLODipine  5 mg Oral Daily  . aspirin EC  81 mg Oral Daily  . atorvastatin  80 mg Oral Daily  . calcitRIOL  0.5 mcg Oral Daily  . carvedilol  12.5 mg Oral BID  . clopidogrel  75 mg Oral Daily  . docusate sodium  100 mg Oral BID  . ferrous  sulfate  325 mg Oral Daily  . furosemide  80 mg Oral BID  . heparin  5,000 Units Subcutaneous Q8H  . insulin aspart  0-15 Units Subcutaneous TID WC  . insulin aspart  0-5 Units Subcutaneous QHS  . insulin aspart  8 Units Subcutaneous TID WC  . insulin glargine  40 Units Subcutaneous Q2200  . levETIRAcetam  1,000 mg Oral QHS  . lisinopril  10 mg Oral Daily  . mirtazapine  7.5 mg Oral QHS  . sevelamer carbonate  800 mg Oral TID WC  . sodium bicarbonate  650 mg Oral BID  . sodium chloride flush  3 mL Intravenous Q12H   Continuous Infusions: . sodium chloride    . DAPTOmycin (CUBICIN)  IV Stopped (12/04/17 1537)  . meropenem (MERREM) IV      Assessment/Plan:  1. Right groin abscess and boil.  This could be the source of the patient's fever.  Patient on daptomycin and meropenem.  Follow-up cultures.  So far blood cultures are negative.  I put in for surgical consultation by patient was down for dialysis. 2. Acute encephalopathy this has improved. 3. End-stage renal disease on dialysis today 4. Essential hypertension continue usual medications 5. History of seizure on Keppra 6. Hyperlipidemia unspecified on atorvastatin 7. Type 2 diabetes mellitus on sliding scale and glargine insulin  Code Status:     Code Status Orders  (From admission, onward)        Start     Ordered   12/04/17 0511  Full code  Continuous     12/04/17 0511    Code Status History    Date Active Date Inactive Code Status Order ID Comments User Context   08/22/2017 12:44 08/22/2017 18:00 Full Code [id number]  Algernon Huxley, MD Inpatient   04/21/2017 18:19 04/25/2017 19:15 Full Code [id number]  Dustin Flock, MD  Inpatient   11/06/2015 20:35 11/07/2015 18:18 Full Code [id number]  Aldean Jewett, MD ED     Family Communication: Left message for wife [name] on the phone Disposition Plan: To be determined  Consultants:  Nephrology  General surgery  Antibiotics:  Meropenem  Cubicin  Time spent: 26  minutes  [place]

## 2017-12-05 NOTE — Progress Notes (Signed)
[facility]  ROUNDING NOTE   Subjective:  Patient known to [place] from prior admissions. Normally he is followed by [facility] nephrology. He undergoes dialysis on Monday, Wednesday, Friday. Patient brought in by his wife for fever. His influenza screen was found to be negative.  Patient seen prior to starting dialysis.  Unfortunately his catheter malfunctioned today It also appears to be sliding out of place   Objective:  Vital signs in last 24 hours:  Temp:  [98.2 F (36.8 C)-98.7 F (37.1 C)] 98.3 F (36.8 C) (02/18 1623) Pulse Rate:  [61-66] 66 (02/18 1623) Resp:  [15-20] 18 (02/18 1623) BP: (132-158)/(62-77) 138/73 (02/18 1623) SpO2:  [98 %-100 %] 98 % (02/18 1623)  Weight change:  Filed Weights   12/04/17 0040 12/04/17 0519  Weight: 80.7 kg (178 lb) 80.3 kg (177 lb)    Intake/Output: I/O last 3 completed shifts: In: 62 [P.O.:420; IV Piggyback:100] Out: 300 [Urine:300]   Intake/Output this shift:  Total I/O In: 240 [P.O.:240] Out: -   Physical Exam: General: No acute distress  Head: Normocephalic, atraumatic. Moist oral mucosal membranes  Eyes: Anicteric  Neck: Supple, trachea midline  Lungs:  Clear to auscultation, normal effort  Heart: S1S2 no rubs  Abdomen:  Soft, nontender, bowel sounds present,   Extremities: R BKA, trace LE edema  Neurologic: Awake, alert, following commands  Skin: right groin abscess  Access: R IJ permcath,     Basic Metabolic Panel: Recent Labs  Lab 12/04/17 0042 12/04/17 0517  NA 139  --   K 4.1  --   CL 100*  --   CO2 23  --   GLUCOSE 200*  --   BUN 44*  --   CREATININE 6.95* 7.10*  CALCIUM 9.3  --     Liver Function Tests: Recent Labs  Lab 12/01/17 1158 12/04/17 0042  AST 9 11*  ALT 6 10*  ALKPHOS 89 87  BILITOT 0.2 0.5  PROT 7.3 8.8*  ALBUMIN 3.8 3.9   No results for input(s): LIPASE, AMYLASE in the last 168 hours. Recent Labs  Lab 12/04/17 0517  AMMONIA 29    CBC: Recent Labs  Lab  12/04/17 0042 12/04/17 0517  WBC 16.8* 14.0*  NEUTROABS 15.4*  --   HGB 11.7* 10.1*  HCT 37.4* 32.2*  MCV 88.2 89.0  PLT 283 242    Cardiac Enzymes: Recent Labs  Lab 12/04/17 0042  TROPONINI 0.05*    BNP: Invalid input(s): POCBNP  CBG: Recent Labs  Lab 12/04/17 1215 12/04/17 1704 12/04/17 2121 12/05/17 0737 12/05/17 1202  GLUCAP 243* 164* 104* 110* 108*    Microbiology: Results for orders placed or performed during the hospital encounter of 12/04/17  Blood Culture (routine x 2)     Status: None (Preliminary result)   Collection Time: 12/04/17 12:42 AM  Result Value Ref Range Status   Specimen Description BLOOD RIGHT FOREARM  Final   Special Requests   Final    BOTTLES DRAWN AEROBIC AND ANAEROBIC Blood Culture adequate volume   Culture   Final    NO GROWTH 1 DAY Performed at [facility], [address] [place], [place] [address]    Report Status PENDING  Incomplete  Blood Culture (routine x 2)     Status: None (Preliminary result)   Collection Time: 12/04/17 12:42 AM  Result Value Ref Range Status   Specimen Description BLOOD RIGHT WRIST  Final   Special Requests   Final    BOTTLES DRAWN AEROBIC  AND ANAEROBIC Blood Culture adequate volume   Culture   Final    NO GROWTH 1 DAY Performed at [facility], [address] [place], [place] [address]    Report Status PENDING  Incomplete  Urine culture     Status: None   Collection Time: 12/04/17  2:40 AM  Result Value Ref Range Status   Specimen Description   Final    URINE, RANDOM Performed at [facility], [address], [place], [place] [address]    Special Requests   Final    NONE Performed at [facility], [address], [place], [place] [address]    Culture   Final    NO GROWTH Performed at [facility], [place] [address], [place], [place] [address]    Report Status 12/05/2017 FINAL  Final  Respiratory Panel by PCR     Status: None   Collection Time:  12/04/17  3:40 AM  Result Value Ref Range Status   Adenovirus NOT DETECTED NOT DETECTED Final   Coronavirus 229E NOT DETECTED NOT DETECTED Final   Coronavirus HKU1 NOT DETECTED NOT DETECTED Final   Coronavirus NL63 NOT DETECTED NOT DETECTED Final   Coronavirus OC43 NOT DETECTED NOT DETECTED Final   Metapneumovirus NOT DETECTED NOT DETECTED Final   Rhinovirus / Enterovirus NOT DETECTED NOT DETECTED Final   Influenza A NOT DETECTED NOT DETECTED Final   Influenza B NOT DETECTED NOT DETECTED Final   Parainfluenza Virus 1 NOT DETECTED NOT DETECTED Final   Parainfluenza Virus 2 NOT DETECTED NOT DETECTED Final   Parainfluenza Virus 3 NOT DETECTED NOT DETECTED Final   Parainfluenza Virus 4 NOT DETECTED NOT DETECTED Final   Respiratory Syncytial Virus NOT DETECTED NOT DETECTED Final   Bordetella pertussis NOT DETECTED NOT DETECTED Final   Chlamydophila pneumoniae NOT DETECTED NOT DETECTED Final   Mycoplasma pneumoniae NOT DETECTED NOT DETECTED Final    Comment: Performed at [facility], [place] [address], [place], [place] [address]  MRSA PCR Screening     Status: None   Collection Time: 12/04/17  5:57 AM  Result Value Ref Range Status   MRSA by PCR NEGATIVE NEGATIVE Final    Comment:        The GeneXpert MRSA Assay (FDA approved for NASAL specimens only), is one component of a comprehensive MRSA colonization surveillance program. It is not intended to diagnose MRSA infection nor to guide or monitor treatment for MRSA infections. Performed at [facility], [address] [place], [place] [address]   Aerobic/Anaerobic Culture (surgical/deep wound)     Status: None (Preliminary result)   Collection Time: 12/05/17 10:11 AM  Result Value Ref Range Status   Specimen Description   Final    GROIN RIGHT Performed at [facility], [place] [address], [place], [place] [address]    Special Requests   Final    Immunocompromised Performed at [facility], [place], [place] [address]    Gram Stain   Final    RARE WBC PRESENT, PREDOMINANTLY PMN MODERATE GRAM NEGATIVE RODS FEW GRAM POSITIVE COCCI IN PAIRS FEW GRAM POSITIVE RODS Performed at [facility], [place] [address] [place], [place] [address]    Culture PENDING  Incomplete   Report Status PENDING  Incomplete    Coagulation Studies: Recent Labs    12/04/17 0517  LABPROT 14.3  INR 1.12    Urinalysis: Recent Labs    12/04/17 0240  COLORURINE YELLOW*  LABSPEC 1.011  PHURINE 6.0  GLUCOSEU >=500*  HGBUR NEGATIVE  BILIRUBINUR NEGATIVE  KETONESUR NEGATIVE  PROTEINUR 100*  NITRITE NEGATIVE  LEUKOCYTESUR NEGATIVE      Imaging: Dg Chest Port 1 View  Result Date: 12/04/2017 CLINICAL DATA:  Fever.  Sepsis. EXAM: PORTABLE CHEST 1 VIEW COMPARISON:  Radiographs and CT August 2018 FINDINGS: Right-sided dialysis catheter with tips in the SVC. Heart size upper normal with normal mediastinal contours. Small right pleural effusion versus right pleural thickening or scarring. No pulmonary edema. No confluent airspace disease. No pneumothorax. IMPRESSION: Small right pleural effusion or thickening/scarring. No focal consolidation. Electronically Signed   By: Jeb Levering M.D.   On: 12/04/2017 01:01     Medications:   . sodium chloride    . DAPTOmycin (CUBICIN)  IV Stopped (12/04/17 1537)  . meropenem (MERREM) IV     . amLODipine  5 mg Oral Daily  . aspirin EC  81 mg Oral Daily  . atorvastatin  80 mg Oral Daily  . calcitRIOL  0.5 mcg Oral Daily  . carvedilol  12.5 mg Oral BID  . clopidogrel  75 mg Oral Daily  . docusate sodium  100 mg Oral BID  . ferrous sulfate  325 mg Oral Daily  . furosemide  80 mg Oral BID  . heparin  5,000 Units Subcutaneous Q8H  . insulin aspart  0-15 Units Subcutaneous TID WC  . insulin aspart  0-5 Units Subcutaneous QHS  . insulin aspart  8 Units Subcutaneous TID WC  . insulin glargine  40 Units Subcutaneous Q2200  . levETIRAcetam  1,000  mg Oral QHS  . lisinopril  10 mg Oral Daily  . mirtazapine  7.5 mg Oral QHS  . sevelamer carbonate  800 mg Oral TID WC  . sodium bicarbonate  650 mg Oral BID  . sodium chloride flush  3 mL Intravenous Q12H   sodium chloride, acetaminophen **OR** acetaminophen, ondansetron **OR** ondansetron (ZOFRAN) IV, polyethylene glycol, sodium chloride flush, triamcinolone ointment  Assessment/ Plan:  57 y.o. male with past medical history of ESRD on HD MWF followed by [facility] nephrology, diabetes mellitus type 2, history of DVT, right below the knee amputation, hypertension, history of intracerebral hemorrhage, anemia of chronic kidney disease, secondary hyperparathyroidism, seizure disorder who presents now with fever.  [facility] nephrology/[address]/MWF.  1.  ESRD on HD MWF.  Patient could not get dialysis today due to access malfunction Discussed with vascular surgeon Dr. Lucky Cowboy. Because of ongoing abscess, a temporary dialysis catheter will be placed until infection is adequately controlled.  He will then get a new tunneled dialysis catheter  2.  Fever.  Influenza screen was found to be negative.  Chest x-ray negative for obvious pneumonia.   Potential source of right groin abscess.  Patient is currently getting IV antibiotics WBC count elevated at 14.0  3.  Anemia of chronic kidney disease.  Hemoglobin currently 10.1.   We will resume low-dose EPO with hemodialysis  4.  Secondary hyperparathyroidism.  Monitor phosphorus.  Maintain the patient on Renvela 800 mg p.o. 3 times daily.   LOS: [place] 2/18/20194:44 PM

## 2017-12-05 NOTE — Progress Notes (Signed)
Dr Candiss Norse was notified to ask about the plan for the patient since his perm cath is not working.  He said he has already spoke with Dr Lucky Cowboy.  Order received to put in a consult

## 2017-12-05 NOTE — Progress Notes (Signed)
Pre HD  

## 2017-12-05 NOTE — Consult Note (Signed)
Surgical Consultation  12/05/2017  [name] is an 57 y.o. male.   Referring Physician:Wieting  CC:rt groin abscess  HPI: Pt with mult med problems, ESRD. Has rt groin cellulitis and Hx of HS. Also has decub, early stage  Past Medical History:  Diagnosis Date  . Anemia   . Crohn disease ([place])   . Diabetes mellitus without complication ([place])   . DVT of lower extremity (deep venous thrombosis) ([place]) 2016  . Hidradenitis suppurativa   . Hypertension   . ICH (intracerebral hemorrhage) ([place])   . Renal disorder     Past Surgical History:  Procedure Laterality Date  . ABDOMINAL SURGERY    . BELOW KNEE LEG AMPUTATION Right 08/2017   [facility]  . KNEE SURGERY Left 02/04/2016   [facility]  . LOWER EXTREMITY ANGIOGRAPHY Right 08/08/2017   Procedure: Lower Extremity Angiography;  Surgeon: Algernon Huxley, MD;  Location: [facility];  Service: Cardiovascular;  Laterality: Right;  . LOWER EXTREMITY ANGIOGRAPHY Right 08/22/2017   Procedure: Lower Extremity Angiography;  Surgeon: Algernon Huxley, MD;  Location: [facility];  Service: Cardiovascular;  Laterality: Right;  . LOWER EXTREMITY INTERVENTION  08/08/2017   Procedure: LOWER EXTREMITY INTERVENTION;  Surgeon: Algernon Huxley, MD;  Location: [facility];  Service: Cardiovascular;;  . LOWER EXTREMITY INTERVENTION  08/22/2017   Procedure: LOWER EXTREMITY INTERVENTION;  Surgeon: Algernon Huxley, MD;  Location: [facility];  Service: Cardiovascular;;    Family History  Problem Relation Age of Onset  . Irritable bowel syndrome Sister   . Diabetes Sister   . Heart disease Mother   . Diabetes Mother   . Heart disease Father   . Rheumatic fever Father        as child  . Psoriasis Brother   . Arthritis Brother   . Diabetes Sister   . Diabetes Sister     Social History:  reports that  has never smoked. he has never used smokeless tobacco. He reports that he uses drugs. Drug: Marijuana. He reports that he does not drink  alcohol.  Allergies:  Allergies  Allergen Reactions  . Vancomycin Shortness Of Breath    Eyes watering, SOB, wheezing  . Cefepime Other (See Comments)  . Methotrexate Other (See Comments)    Blood count drops  . Tape     Medications reviewed.   Review of Systems:   Review of Systems  Unable to perform ROS: Medical condition     Physical Exam:  BP 138/73 (BP Location: Right Arm)   Pulse 66   Temp 98.3 F (36.8 C) (Oral)   Resp 18   Ht '5\' 11"'  (1.803 m)   Wt 177 lb (80.3 kg)   SpO2 98%   BMI 24.69 kg/m   Physical Exam  Musculoskeletal:  Rt BKA, wrapped  Neurological: He is alert.  Skin: Skin is warm and dry. No rash noted. There is erythema.  Rt groin with min erythema, no fluctuance, nontender HS scars both groins  Sacral decub, early stage  Vitals reviewed.     Results for orders placed or performed during the hospital encounter of 12/04/17 (from the past 48 hour(s))  Comprehensive metabolic panel     Status: Abnormal   Collection Time: 12/04/17 12:42 AM  Result Value Ref Range   Sodium 139 135 - 145 mmol/L   Potassium 4.1 3.5 - 5.1 mmol/L   Chloride 100 (L) 101 - 111 mmol/L   CO2 23 22 -  32 mmol/L   Glucose, Bld 200 (H) 65 - 99 mg/dL   BUN 44 (H) 6 - 20 mg/dL   Creatinine, Ser 6.95 (H) 0.61 - 1.24 mg/dL   Calcium 9.3 8.9 - 10.3 mg/dL   Total Protein 8.8 (H) 6.5 - 8.1 g/dL   Albumin 3.9 3.5 - 5.0 g/dL   AST 11 (L) 15 - 41 U/L   ALT 10 (L) 17 - 63 U/L   Alkaline Phosphatase 87 38 - 126 U/L   Total Bilirubin 0.5 0.3 - 1.2 mg/dL   GFR calc non Af Amer 8 (L) >60 mL/min   GFR calc Af Amer 9 (L) >60 mL/min    Comment: (NOTE) The eGFR has been calculated using the CKD EPI equation. This calculation has not been validated in all clinical situations. eGFR's persistently <60 mL/min signify possible Chronic Kidney Disease.    Anion gap 16 (H) 5 - 15    Comment: Performed at [facility], [address] [place], [place] [address]  CBC WITH  DIFFERENTIAL     Status: Abnormal   Collection Time: 12/04/17 12:42 AM  Result Value Ref Range   WBC 16.8 (H) 3.8 - 10.6 K/uL   RBC 4.24 (L) 4.40 - 5.90 MIL/uL   Hemoglobin 11.7 (L) 13.0 - 18.0 g/dL   HCT 37.4 (L) 40.0 - 52.0 %   MCV 88.2 80.0 - 100.0 fL   MCH 27.6 26.0 - 34.0 pg   MCHC 31.2 (L) 32.0 - 36.0 g/dL   RDW 17.6 (H) 11.5 - 14.5 %   Platelets 283 150 - 440 K/uL   Neutrophils Relative % 92 %   Neutro Abs 15.4 (H) 1.4 - 6.5 K/uL   Lymphocytes Relative 5 %   Lymphs Abs 0.8 (L) 1.0 - 3.6 K/uL   Monocytes Relative 3 %   Monocytes Absolute 0.5 0.2 - 1.0 K/uL   Eosinophils Relative 0 %   Eosinophils Absolute 0.0 0 - 0.7 K/uL   Basophils Relative 0 %   Basophils Absolute 0.0 0 - 0.1 K/uL    Comment: Performed at [facility], [address] [place], [place] [address]  Blood Culture (routine x 2)     Status: None (Preliminary result)   Collection Time: 12/04/17 12:42 AM  Result Value Ref Range   Specimen Description BLOOD RIGHT FOREARM    Special Requests      BOTTLES DRAWN AEROBIC AND ANAEROBIC Blood Culture adequate volume   Culture      NO GROWTH 1 DAY Performed at [facility], [address] [place], [place] [address]    Report Status PENDING   Blood Culture (routine x 2)     Status: None (Preliminary result)   Collection Time: 12/04/17 12:42 AM  Result Value Ref Range   Specimen Description BLOOD RIGHT WRIST    Special Requests      BOTTLES DRAWN AEROBIC AND ANAEROBIC Blood Culture adequate volume   Culture      NO GROWTH 1 DAY Performed at [facility], [address] [place], [place] [address]    Report Status PENDING   Lactic acid, plasma     Status: None   Collection Time: 12/04/17 12:42 AM  Result Value Ref Range   Lactic Acid, Venous 1.4 0.5 - 1.9 mmol/L    Comment: Performed at [facility], [address] [place], [place] [address]  Troponin I     Status: Abnormal   Collection Time: 12/04/17 12:42 AM  Result Value Ref Range   Troponin I 0.05 (HH) <0.03 ng/mL    Comment: CRITICAL RESULT CALLED TO, READ BACK BY AND VERIFIED WITH KASEY WELCH AT 0139 12/04/17.PMH Performed at [facility], [address] [place], [place] [address]   Brain natriuretic peptide     Status: Abnormal   Collection Time: 12/04/17 12:42 AM  Result Value Ref Range   B Natriuretic Peptide 177.0 (H) 0.0 - 100.0 pg/mL    Comment: Performed at [facility], [address] [place], [place] [address]  Influenza panel by PCR (type A & B)     Status: None   Collection Time: 12/04/17 12:42 AM  Result Value Ref Range   Influenza A By PCR NEGATIVE NEGATIVE   Influenza B By PCR NEGATIVE NEGATIVE    Comment: (NOTE) The Xpert Xpress Flu assay is intended as an aid in the diagnosis of  influenza and should not be used as a sole basis for treatment.  This  assay is FDA approved for nasopharyngeal swab specimens only. Nasal  washings and aspirates are unacceptable for Xpert Xpress Flu testing. Performed at [facility], [address] [place], [place] [address]   Procalcitonin     Status: None   Collection Time: 12/04/17 12:42 AM  Result Value Ref Range   Procalcitonin 2.08 ng/mL    Comment:        Interpretation: PCT > 2 ng/mL: Systemic infection (sepsis) is likely, unless other causes are known. (NOTE)       Sepsis PCT Algorithm           Lower Respiratory Tract                                      Infection PCT Algorithm    ----------------------------     ----------------------------         PCT < 0.25 ng/mL                PCT < 0.10 ng/mL         Strongly encourage             Strongly discourage   discontinuation of antibiotics    initiation of antibiotics    ----------------------------     -----------------------------       PCT 0.25 - 0.50 ng/mL            PCT 0.10 - 0.25 ng/mL               OR       >80% decrease in PCT            Discourage initiation of                                             antibiotics      Encourage discontinuation           of antibiotics    ----------------------------     -----------------------------         PCT >= 0.50 ng/mL              PCT 0.26 - 0.50 ng/mL               AND       <80% decrease in PCT  Encourage initiation of                                             antibiotics       Encourage continuation           of antibiotics    ----------------------------     -----------------------------        PCT >= 0.50 ng/mL                  PCT > 0.50 ng/mL               AND         increase in PCT                  Strongly encourage                                      initiation of antibiotics    Strongly encourage escalation           of antibiotics                                     -----------------------------                                           PCT <= 0.25 ng/mL                                                 OR                                        > 80% decrease in PCT                                     Discontinue / Do not initiate                                             antibiotics Performed at [facility], [address] [place], [place] [address]   Urinalysis, Complete w Microscopic     Status: Abnormal   Collection Time: 12/04/17  2:40 AM  Result Value Ref Range   Color, Urine YELLOW (A) YELLOW   APPearance HAZY (A) CLEAR   Specific Gravity, Urine 1.011 1.005 - 1.030   pH 6.0 5.0 - 8.0   Glucose, UA >=500 (A) NEGATIVE mg/dL   Hgb urine dipstick NEGATIVE NEGATIVE   Bilirubin Urine NEGATIVE NEGATIVE   Ketones, ur NEGATIVE NEGATIVE mg/dL   Protein, ur 100 (A) NEGATIVE mg/dL   Nitrite NEGATIVE NEGATIVE   Leukocytes, UA NEGATIVE NEGATIVE   RBC / HPF 0-5 0 - 5 RBC/hpf   WBC,  UA 0-5 0 - 5 WBC/hpf   Bacteria, UA RARE (A) NONE SEEN   Squamous Epithelial / LPF 0-5 (A) NONE SEEN    Comment: Performed at [facility], [address], [place], [place] [address]   Urine culture     Status: None   Collection Time: 12/04/17  2:40 AM  Result Value Ref Range   Specimen Description      URINE, RANDOM Performed at [facility], [address], [place], [place] [address]    Special Requests      NONE Performed at [facility], [address] [place], [place] [address]    Culture      NO GROWTH Performed at [facility], [place] [address] [place], [place] [address]    Report Status 12/05/2017 FINAL   Respiratory Panel by PCR     Status: None   Collection Time: 12/04/17  3:40 AM  Result Value Ref Range   Adenovirus NOT DETECTED NOT DETECTED   Coronavirus 229E NOT DETECTED NOT DETECTED   Coronavirus HKU1 NOT DETECTED NOT DETECTED   Coronavirus NL63 NOT DETECTED NOT DETECTED   Coronavirus OC43 NOT DETECTED NOT DETECTED   Metapneumovirus NOT DETECTED NOT DETECTED   Rhinovirus / Enterovirus NOT DETECTED NOT DETECTED   Influenza A NOT DETECTED NOT DETECTED   Influenza B NOT DETECTED NOT DETECTED   Parainfluenza Virus 1 NOT DETECTED NOT DETECTED   Parainfluenza Virus 2 NOT DETECTED NOT DETECTED   Parainfluenza Virus 3 NOT DETECTED NOT DETECTED   Parainfluenza Virus 4 NOT DETECTED NOT DETECTED   Respiratory Syncytial Virus NOT DETECTED NOT DETECTED   Bordetella pertussis NOT DETECTED NOT DETECTED   Chlamydophila pneumoniae NOT DETECTED NOT DETECTED   Mycoplasma pneumoniae NOT DETECTED NOT DETECTED    Comment: Performed at [facility] [address] [place], [place] [address]  Protime-INR     Status: None   Collection Time: 12/04/17  5:17 AM  Result Value Ref Range   Prothrombin Time 14.3 11.4 - 15.2 seconds   INR 1.12     Comment: Performed at [facility], [address] [place], [place] [address]  APTT     Status: Abnormal   Collection Time: 12/04/17  5:17 AM  Result Value Ref Range   aPTT 37 (H) 24 - 36 seconds    Comment:        IF BASELINE aPTT IS ELEVATED, SUGGEST PATIENT RISK ASSESSMENT BE  USED TO DETERMINE APPROPRIATE ANTICOAGULANT THERAPY. Performed at [facility], [address] [place], [place] [address]   CBC     Status: Abnormal   Collection Time: 12/04/17  5:17 AM  Result Value Ref Range   WBC 14.0 (H) 3.8 - 10.6 K/uL   RBC 3.62 (L) 4.40 - 5.90 MIL/uL   Hemoglobin 10.1 (L) 13.0 - 18.0 g/dL   HCT 32.2 (L) 40.0 - 52.0 %   MCV 89.0 80.0 - 100.0 fL   MCH 28.0 26.0 - 34.0 pg   MCHC 31.5 (L) 32.0 - 36.0 g/dL   RDW 18.3 (H) 11.5 - 14.5 %   Platelets 242 150 - 440 K/uL    Comment: Performed at [facility], [address] [place], [place] [address]  Creatinine, serum     Status: Abnormal   Collection Time: 12/04/17  5:17 AM  Result Value Ref Range   Creatinine, Ser 7.10 (H) 0.61 - 1.24 mg/dL   GFR calc non Af Amer 8 (L) >  60 mL/min   GFR calc Af Amer 9 (L) >60 mL/min    Comment: (NOTE) The eGFR has been calculated using the CKD EPI equation. This calculation has not been validated in all clinical situations. eGFR's persistently <60 mL/min signify possible Chronic Kidney Disease. Performed at [facility], [address] [place], [place] [address]   Ammonia     Status: None   Collection Time: 12/04/17  5:17 AM  Result Value Ref Range   Ammonia 29 9 - 35 umol/L    Comment: Performed at [facility], [address] [place], [place] [address]  RPR     Status: None   Collection Time: 12/04/17  5:17 AM  Result Value Ref Range   RPR Ser Ql Non Reactive Non Reactive    Comment: (NOTE) Performed At: [facility] [place], [place] [id number] Rush Farmer MD [id number] Performed at [facility], [address] [place], [place] [address]   MRSA PCR Screening     Status: None   Collection Time: 12/04/17  5:57 AM  Result Value Ref Range   MRSA by PCR NEGATIVE NEGATIVE    Comment:        The GeneXpert MRSA Assay (FDA approved for NASAL specimens only), is one component of  a comprehensive MRSA colonization surveillance program. It is not intended to diagnose MRSA infection nor to guide or monitor treatment for MRSA infections. Performed at [facility], [address] [place], [place] [address]   Glucose, capillary     Status: Abnormal   Collection Time: 12/04/17  7:29 AM  Result Value Ref Range   Glucose-Capillary 220 (H) 65 - 99 mg/dL  Glucose, capillary     Status: Abnormal   Collection Time: 12/04/17 12:15 PM  Result Value Ref Range   Glucose-Capillary 243 (H) 65 - 99 mg/dL  Glucose, capillary     Status: Abnormal   Collection Time: 12/04/17  5:04 PM  Result Value Ref Range   Glucose-Capillary 164 (H) 65 - 99 mg/dL  Glucose, capillary     Status: Abnormal   Collection Time: 12/04/17  9:21 PM  Result Value Ref Range   Glucose-Capillary 104 (H) 65 - 99 mg/dL  Glucose, capillary     Status: Abnormal   Collection Time: 12/05/17  7:37 AM  Result Value Ref Range   Glucose-Capillary 110 (H) 65 - 99 mg/dL  Aerobic/Anaerobic Culture (surgical/deep wound)     Status: None (Preliminary result)   Collection Time: 12/05/17 10:11 AM  Result Value Ref Range   Specimen Description      GROIN RIGHT Performed at [facility], [place] [address] [place], [place] [address]    Special Requests      Immunocompromised Performed at [facility], [place], [place] [address]    Gram Stain      RARE WBC PRESENT, PREDOMINANTLY PMN MODERATE GRAM NEGATIVE RODS FEW GRAM POSITIVE COCCI IN PAIRS FEW GRAM POSITIVE RODS Performed at [facility], [place] [address] [place], [place] [address]    Culture PENDING    Report Status PENDING   Glucose, capillary     Status: Abnormal   Collection Time: 12/05/17 12:02 PM  Result Value Ref Range   Glucose-Capillary 108 (H) 65 - 99 mg/dL  Glucose, capillary     Status: None   Collection Time: 12/05/17  4:47 PM  Result Value Ref Range   Glucose-Capillary 96 65 - 99 mg/dL  Dg  Chest Port 1 View  Result Date: 12/04/2017 CLINICAL DATA:  Fever.  Sepsis. EXAM: PORTABLE CHEST 1 VIEW COMPARISON:  Radiographs and CT August 2018 FINDINGS: Right-sided dialysis catheter with tips in the SVC. Heart size upper normal with normal mediastinal contours. Small right pleural effusion versus right pleural thickening or scarring. No pulmonary edema. No confluent airspace disease. No pneumothorax. IMPRESSION: Small right pleural effusion or thickening/scarring. No focal consolidation. Electronically Signed   By: Jeb Levering M.D.   On: 12/04/2017 01:01    Assessment/Plan:  Pt with Hx of hidradenitis suppurativa and rt groin cellulitis. No drainable abscess. Also has early stage sacral decub   No surgical plans now unless worsens or abx fail will follow Florene Glen, MD, FACS

## 2017-12-05 NOTE — Progress Notes (Signed)
Patient sent down earlier to dialysis

## 2017-12-06 ENCOUNTER — Telehealth: Payer: Self-pay

## 2017-12-06 ENCOUNTER — Encounter
Admission: EM | Disposition: A | Discharge status: Home / Self Care | Payer: Self-pay | Source: Home / Self Care | Attending: Internal Medicine

## 2017-12-06 ENCOUNTER — Inpatient Hospital Stay: Payer: BLUE CROSS/BLUE SHIELD

## 2017-12-06 ENCOUNTER — Encounter: Payer: Self-pay | Admitting: Radiology

## 2017-12-06 DIAGNOSIS — N186 End stage renal disease: Secondary | ICD-10-CM

## 2017-12-06 LAB — GLUCOSE, CAPILLARY
Glucose-Capillary: 139 mg/dL — ABNORMAL HIGH (ref 65–99)
Glucose-Capillary: 48 mg/dL — ABNORMAL LOW (ref 65–99)
Glucose-Capillary: 130 mg/dL — ABNORMAL HIGH (ref 65–99)

## 2017-12-06 SURGERY — DIALYSIS/PERMA CATHETER REMOVAL
Anesthesia: Moderate Sedation

## 2017-12-06 MED ORDER — LIVING WELL WITH DIABETES BOOK
Freq: Once | Status: AC
  Administered 2017-12-06: 19:00:00
  Filled 2017-12-06: qty 1

## 2017-12-06 MED ORDER — INSULIN ASPART 100 UNIT/ML ~~LOC~~ SOLN
3.0000 [IU] | Freq: Three times a day (TID) | SUBCUTANEOUS | Status: DC
  Administered 2017-12-08 – 2017-12-11 (×7): 3 [IU] via SUBCUTANEOUS
  Filled 2017-12-06 (×10): qty 1

## 2017-12-06 MED ORDER — INSULIN GLARGINE 100 UNIT/ML ~~LOC~~ SOLN
20.0000 [IU] | Freq: Every day | SUBCUTANEOUS | Status: DC
  Filled 2017-12-06: qty 0.2

## 2017-12-06 MED ORDER — DEXTROSE 50 % IV SOLN
25.0000 g | Freq: Once | INTRAVENOUS | Status: AC
  Administered 2017-12-06: 50 mL via INTRAVENOUS

## 2017-12-06 MED ORDER — LIDOCAINE-EPINEPHRINE (PF) 1 %-1:200000 IJ SOLN
INTRAMUSCULAR | Status: DC | PRN
  Administered 2017-12-06: 20 mL via INTRADERMAL

## 2017-12-06 MED ORDER — INSULIN GLARGINE 100 UNIT/ML ~~LOC~~ SOLN
17.0000 [IU] | Freq: Every day | SUBCUTANEOUS | Status: DC
  Filled 2017-12-06 (×2): qty 0.17

## 2017-12-06 MED ORDER — DEXTROSE 250 MG/ML IV SOLN: 25.0000 g | Freq: Once | INTRAVENOUS | Status: DC

## 2017-12-06 MED ORDER — DEXTROSE 50 % IV SOLN
INTRAVENOUS | Status: AC
  Administered 2017-12-06: 50 mL via INTRAVENOUS
  Filled 2017-12-06: qty 50

## 2017-12-06 MED ORDER — IOPAMIDOL (ISOVUE-300) INJECTION 61%
100.0000 mL | Freq: Once | INTRAVENOUS | Status: AC | PRN
  Administered 2017-12-06: 100 mL via INTRAVENOUS

## 2017-12-06 SURGICAL SUPPLY — 2 items
FORCEPS HALSTEAD CVD 5IN STRL (INSTRUMENTS) ×2 IMPLANT
TRAY LACERAT/PLASTIC (MISCELLANEOUS) ×2 IMPLANT

## 2017-12-06 NOTE — Progress Notes (Signed)
Patient ID: [name], male   DOB: [birth year], 57 y.o.   MRN: [id number]  [facility] [employer] PROGRESS NOTE  [name] ZHY:[id number] DOB: 02/11/1961 DOA: 12/04/2017 PCP: [name], MD  HPI/Subjective: Patient feels well and was asking when he can go home.  Right groin still has a little bit of drainage but less than yesterday.  No further fever.  Objective: Vitals:   12/06/17 1353 12/06/17 1540  BP: (!) 166/66 (!) 182/91  Pulse: 65 66  Resp: 16 13  Temp: 98.1 F (36.7 C) 98 F (36.7 C)  SpO2: 100% 100%    Filed Weights   12/04/17 0040 12/04/17 0519 12/06/17 0600  Weight: 80.7 kg (178 lb) 80.3 kg (177 lb) 79.5 kg (175 lb 4.3 oz)    ROS: Review of Systems  Constitutional: Negative for chills and fever.  Eyes: Negative for blurred vision.  Respiratory: Negative for cough and shortness of breath.   Cardiovascular: Negative for chest pain.  Gastrointestinal: Negative for abdominal pain, constipation, diarrhea, nausea and vomiting.  Genitourinary: Negative for dysuria.  Musculoskeletal: Negative for joint pain.  Neurological: Negative for dizziness and headaches.   Exam: Physical Exam  Constitutional: He is oriented to person, place, and time.  HENT:  Nose: No mucosal edema.  Mouth/Throat: No oropharyngeal exudate or posterior oropharyngeal edema.  Eyes: Conjunctivae, EOM and lids are normal. Pupils are equal, round, and reactive to light.  Neck: No JVD present. Carotid bruit is not present. No edema present. No thyroid mass and no thyromegaly present.  Cardiovascular: S1 normal and S2 normal. Exam reveals no gallop.  No murmur heard. Pulses:      Dorsalis pedis pulses are 2+ on the right side, and 2+ on the left side.  Respiratory: No respiratory distress. He has no wheezes. He has no rhonchi. He has no rales.  GI: Soft. Bowel sounds are normal. There is no tenderness.  Musculoskeletal:       Right knee: He exhibits no swelling.       Left ankle: He  exhibits no swelling.  Lymphadenopathy:    He has no cervical adenopathy.  Neurological: He is alert and oriented to person, place, and time. No cranial nerve deficit.  Skin: Skin is warm. Nails show no clubbing.  Right groin looks like some chronic changes and some scarring.  Still with some drainage.  Psychiatric: He has a normal mood and affect.      Data Reviewed: Basic Metabolic Panel: Recent Labs  Lab 12/04/17 0042 12/04/17 0517  NA 139  --   K 4.1  --   CL 100*  --   CO2 23  --   GLUCOSE 200*  --   BUN 44*  --   CREATININE 6.95* 7.10*  CALCIUM 9.3  --    Liver Function Tests: Recent Labs  Lab 12/01/17 1158 12/04/17 0042  AST 9 11*  ALT 6 10*  ALKPHOS 89 87  BILITOT 0.2 0.5  PROT 7.3 8.8*  ALBUMIN 3.8 3.9    Recent Labs  Lab 12/04/17 0517  AMMONIA 29   CBC: Recent Labs  Lab 12/04/17 0042 12/04/17 0517  WBC 16.8* 14.0*  NEUTROABS 15.4*  --   HGB 11.7* 10.1*  HCT 37.4* 32.2*  MCV 88.2 89.0  PLT 283 242   Cardiac Enzymes: Recent Labs  Lab 12/04/17 0042  TROPONINI 0.05*   BNP (last 3 results) Recent Labs    05/19/17 1522 12/04/17 0042  BNP 157.0* 177.0*  CBG: Recent Labs  Lab 12/05/17 1647 12/05/17 2056 12/06/17 0739 12/06/17 0820 12/06/17 1145  GLUCAP 96 108* 48* 139* 130*    Recent Results (from the past 240 hour(s))  Blood Culture (routine x 2)     Status: None (Preliminary result)   Collection Time: 12/04/17 12:42 AM  Result Value Ref Range Status   Specimen Description BLOOD RIGHT FOREARM  Final   Special Requests   Final    BOTTLES DRAWN AEROBIC AND ANAEROBIC Blood Culture adequate volume   Culture   Final    NO GROWTH 2 DAYS Performed at [facility], [address], [place], [place] [address]    Report Status PENDING  Incomplete  Blood Culture (routine x 2)     Status: None (Preliminary result)   Collection Time: 12/04/17 12:42 AM  Result Value Ref Range Status   Specimen Description BLOOD RIGHT  WRIST  Final   Special Requests   Final    BOTTLES DRAWN AEROBIC AND ANAEROBIC Blood Culture adequate volume   Culture   Final    NO GROWTH 2 DAYS Performed at [facility], [address], [place], [place] [address]    Report Status PENDING  Incomplete  Urine culture     Status: None   Collection Time: 12/04/17  2:40 AM  Result Value Ref Range Status   Specimen Description   Final    URINE, RANDOM Performed at [facility], [address], [place], [place] [address]    Special Requests   Final    NONE Performed at [facility], [address], [place], [place] [address]    Culture   Final    NO GROWTH Performed at [facility], [place] [address] [place], [place] [address]    Report Status 12/05/2017 FINAL  Final  Respiratory Panel by PCR     Status: None   Collection Time: 12/04/17  3:40 AM  Result Value Ref Range Status   Adenovirus NOT DETECTED NOT DETECTED Final   Coronavirus 229E NOT DETECTED NOT DETECTED Final   Coronavirus HKU1 NOT DETECTED NOT DETECTED Final   Coronavirus NL63 NOT DETECTED NOT DETECTED Final   Coronavirus OC43 NOT DETECTED NOT DETECTED Final   Metapneumovirus NOT DETECTED NOT DETECTED Final   Rhinovirus / Enterovirus NOT DETECTED NOT DETECTED Final   Influenza A NOT DETECTED NOT DETECTED Final   Influenza B NOT DETECTED NOT DETECTED Final   Parainfluenza Virus 1 NOT DETECTED NOT DETECTED Final   Parainfluenza Virus 2 NOT DETECTED NOT DETECTED Final   Parainfluenza Virus 3 NOT DETECTED NOT DETECTED Final   Parainfluenza Virus 4 NOT DETECTED NOT DETECTED Final   Respiratory Syncytial Virus NOT DETECTED NOT DETECTED Final   Bordetella pertussis NOT DETECTED NOT DETECTED Final   Chlamydophila pneumoniae NOT DETECTED NOT DETECTED Final   Mycoplasma pneumoniae NOT DETECTED NOT DETECTED Final    Comment: Performed at [facility], [place] [address] [place], [place] [address]  MRSA PCR Screening     Status: None    Collection Time: 12/04/17  5:57 AM  Result Value Ref Range Status   MRSA by PCR NEGATIVE NEGATIVE Final    Comment:        The GeneXpert MRSA Assay (FDA approved for NASAL specimens only), is one component of a comprehensive MRSA colonization surveillance program. It is not intended to diagnose MRSA infection nor to guide or monitor treatment for MRSA infections. Performed at [facility], [phone]  [address] [place], [place] [address]   Aerobic/Anaerobic Culture (surgical/deep wound)     Status: None (Preliminary result)   Collection Time: 12/05/17 10:11 AM  Result Value Ref Range Status   Specimen Description   Final    GROIN RIGHT Performed at [facility], [place] [address] [place], [place] [address]    Special Requests   Final    Immunocompromised Performed at [facility], [place],  [address]    Gram Stain   Final    RARE WBC PRESENT, PREDOMINANTLY PMN MODERATE GRAM NEGATIVE RODS FEW GRAM POSITIVE COCCI IN PAIRS FEW GRAM POSITIVE RODS    Culture   Final    CULTURE REINCUBATED FOR BETTER GROWTH Performed at [facility], [place] [address], [place],  [address]    Report Status PENDING  Incomplete     Studies: No results found.  Scheduled Meds: . amLODipine  5 mg Oral Daily  . aspirin EC  81 mg Oral Daily  . atorvastatin  80 mg Oral Daily  . calcitRIOL  0.5 mcg Oral Daily  . carvedilol  12.5 mg Oral BID  . clopidogrel  75 mg Oral Daily  . docusate sodium  100 mg Oral BID  . ferrous sulfate  325 mg Oral Daily  . furosemide  80 mg Oral BID  . heparin  5,000 Units Subcutaneous Q8H  . insulin aspart  0-15 Units Subcutaneous TID WC  . insulin aspart  0-5 Units Subcutaneous QHS  . insulin aspart  3 Units Subcutaneous TID WC  . insulin glargine  17 Units Subcutaneous Q2200  . levETIRAcetam  1,000 mg Oral QHS  . lisinopril  10 mg Oral Daily  . living well with diabetes book   Does not apply Once  . mirtazapine   7.5 mg Oral QHS  . sevelamer carbonate  800 mg Oral TID WC  . sodium bicarbonate  650 mg Oral BID  . sodium chloride flush  3 mL Intravenous Q12H   Continuous Infusions: . sodium chloride    . DAPTOmycin (CUBICIN)  IV Stopped (12/04/17 1537)  . meropenem (MERREM) IV Stopped (12/05/17 1830)    Assessment/Plan:  1. Right groin abscess and boil.  This could be the source of the patient's fever. Patient on daptomycin and meropenem.  Follow-up cultures.  So far blood cultures are negative.  Appreciate surgical evaluation.  No surgery needed as per surgery. 2. Dialysis catheter dysfunction.  Tunnel catheter will have to be removed and replaced at some point as per vascular surgery. 3. Acute encephalopathy this has improved. 4. End-stage renal disease.  Dialysis as per nephrology 5. Essential hypertension continue usual medications 6. History of seizure on Keppra 7. Hyperlipidemia unspecified on atorvastatin 8. Type 2 diabetes mellitus on sliding scale and glargine insulin  Code Status:     Code Status Orders  (From admission, onward)        Start     Ordered   12/04/17 0511  Full code  Continuous     12/04/17 0511    Code Status History    Date Active Date Inactive Code Status Order ID Comments User Context   08/22/2017 12:44 08/22/2017 18:00 Full Code [id number]  Algernon Huxley, MD Inpatient   04/21/2017 18:19 04/25/2017 19:15 Full Code [id number]  Dustin Flock, MD Inpatient   11/06/2015 20:35 11/07/2015 18:18 Full Code [id number]  Aldean Jewett, MD ED     Family Communication: Spoke with wife on the  phone yesterday Disposition Plan: home once per minute catheter replaced.  This can potentially be done on Thursday  Consultants:  Nephrology  General surgery  Antibiotics:  Meropenem  Cubicin  Time spent: 25 minutes  [place]

## 2017-12-06 NOTE — Progress Notes (Addendum)
Inpatient Diabetes Program Recommendations  AACE/ADA: New Consensus Statement on Inpatient Glycemic Control (2015)  Target Ranges:  Prepandial:   less than 140 mg/dL      Peak postprandial:   less than 180 mg/dL (1-2 hours)      Critically ill patients:  140 - 180 mg/dL   Lab Results  Component Value Date   GLUCAP 48 (L) 12/06/2017   HGBA1C 5.9 10/20/2017    Review of Glycemic Control  Results for Brady, [name] MONSANTO (MRN [id number]) as of 12/06/2017 08:20  Ref. Range 12/05/2017 07:37 12/05/2017 12:02 12/05/2017 16:47 12/05/2017 20:56 12/06/2017 07:39  Glucose-Capillary Latest Ref Range: 65 - 99 mg/dL 110 (H) 108 (H) 96 108 (H) 48 (L)    Diabetes history: Type 2 with ESRD Outpatient Diabetes medications: Novolog 8-15 units tid with meals, Lantus 40 units qhs  **Spoke to patient at the bedside regarding Lantus and Novolog insulin - he tells me he is taking Lantus 0-17 units qhs and Novolog 0-12 units .  Current orders for Inpatient glycemic control: Novolog 3 units tid with meals, Lantus 20 units qhs, Novolog 0-5 units qhs and Novolog 0-15 units tid  Inpatient Diabetes Program Recommendations: Meal time insulin was given at each meal yesterday (8 units) despite intake of only 25% and 30% because the insulin was not ordered in an order set.    Agree with current orders of Lantus 20 units qhs, mealtime Novolog 3 units tid with parameters and Novolog 0-5 units qhs.  Because of ESRD, I would consider decreasing Novolog correction to 0-9 units tid.   Addendum: Spoke to patient at the bedside regarding Lantus and Novolog insulin - he tells me he is taking Lantus 0-17 units qhs and Novolog 0-12 units .  He sometimes does not take any basal or bolus depending on what his blood sugar is when he goes into the meal or at bedtime.  Discussed the importance of what he is eating to decide on how much Novolog  he takes but he needs to discuss this with his MD.  I reviewed carbohydrates and gave him the  general guidelines of 45-60 grams of carb/meal and 15 grams/snack.  I reviewed the menu he had at the bedside to give instruction and have ordered the Living Well with Diabetes book.  He did not want to see a dietitian.   Based on my conversation with the patient, please decrease Lantus insulin to 10 units qhs- text sent to MD   Gentry Fitz, RN, BA, MHA, CDE Diabetes Coordinator Inpatient Diabetes Program  [phone] (Team Pager) [phone] ([place]) 12/06/2017 8:29 AM

## 2017-12-06 NOTE — Progress Notes (Signed)
[facility]  ROUNDING NOTE   Subjective:  Patient known to [place] from prior admissions. Normally he is followed by [facility] nephrology. He undergoes dialysis on Monday, Wednesday, Friday. Patient brought in by his wife for fever. His influenza screen was found to be negative.  Patient has cathter malfunction   Objective:  Vital signs in last 24 hours:  Temp:  [98 F (36.7 C)-98.5 F (36.9 C)] 98 F (36.7 C) (02/19 0417) Pulse Rate:  [63-71] 68 (02/19 0417) Resp:  [15-21] 21 (02/19 0417) BP: (132-175)/(68-82) 175/82 (02/19 0417) SpO2:  [98 %-100 %] 100 % (02/19 0417) Weight:  [79.5 kg (175 lb 4.3 oz)] 79.5 kg (175 lb 4.3 oz) (02/19 0600)  Weight change:  Filed Weights   12/04/17 0040 12/04/17 0519 12/06/17 0600  Weight: 80.7 kg (178 lb) 80.3 kg (177 lb) 79.5 kg (175 lb 4.3 oz)    Intake/Output: I/O last 3 completed shifts: In: 643 [P.O.:540; I.V.:3; IV Piggyback:100] Out: 300 [Urine:300]   Intake/Output this shift:  Total I/O In: 240 [P.O.:240] Out: -   Physical Exam: General: No acute distress  Head: Normocephalic, atraumatic. Moist oral mucosal membranes  Eyes: Anicteric  Neck: Supple, trachea midline  Lungs:  Clear to auscultation, normal effort  Heart: S1S2 no rubs  Abdomen:  Soft, nontender, bowel sounds present,   Extremities: R BKA, trace LE edema  Neurologic: Awake, alert, following commands  Skin: right groin abscess  Access: R IJ permcath,     Basic Metabolic Panel: Recent Labs  Lab 12/04/17 0042 12/04/17 0517  NA 139  --   K 4.1  --   CL 100*  --   CO2 23  --   GLUCOSE 200*  --   BUN 44*  --   CREATININE 6.95* 7.10*  CALCIUM 9.3  --     Liver Function Tests: Recent Labs  Lab 12/01/17 1158 12/04/17 0042  AST 9 11*  ALT 6 10*  ALKPHOS 89 87  BILITOT 0.2 0.5  PROT 7.3 8.8*  ALBUMIN 3.8 3.9   No results for input(s): LIPASE, AMYLASE in the last 168 hours. Recent Labs  Lab 12/04/17 0517  AMMONIA 29    CBC: Recent  Labs  Lab 12/04/17 0042 12/04/17 0517  WBC 16.8* 14.0*  NEUTROABS 15.4*  --   HGB 11.7* 10.1*  HCT 37.4* 32.2*  MCV 88.2 89.0  PLT 283 242    Cardiac Enzymes: Recent Labs  Lab 12/04/17 0042  TROPONINI 0.05*    BNP: Invalid input(s): POCBNP  CBG: Recent Labs  Lab 12/05/17 1647 12/05/17 2056 12/06/17 0739 12/06/17 0820 12/06/17 1145  GLUCAP 96 108* 48* 139* 130*    Microbiology: Results for orders placed or performed during the hospital encounter of 12/04/17  Blood Culture (routine x 2)     Status: None (Preliminary result)   Collection Time: 12/04/17 12:42 AM  Result Value Ref Range Status   Specimen Description BLOOD RIGHT FOREARM  Final   Special Requests   Final    BOTTLES DRAWN AEROBIC AND ANAEROBIC Blood Culture adequate volume   Culture   Final    NO GROWTH 2 DAYS Performed at [facility], [address] [place], [place] [address]    Report Status PENDING  Incomplete  Blood Culture (routine x 2)     Status: None (Preliminary result)   Collection Time: 12/04/17 12:42 AM  Result Value Ref Range Status   Specimen Description BLOOD RIGHT WRIST  Final   Special Requests  Final    BOTTLES DRAWN AEROBIC AND ANAEROBIC Blood Culture adequate volume   Culture   Final    NO GROWTH 2 DAYS Performed at [facility], [address] [place], [place] [address]    Report Status PENDING  Incomplete  Urine culture     Status: None   Collection Time: 12/04/17  2:40 AM  Result Value Ref Range Status   Specimen Description   Final    URINE, RANDOM Performed at [facility], [address] [place], [place] [address]    Special Requests   Final    NONE Performed at [facility], [address], [place], [place] [address]    Culture   Final    NO GROWTH Performed at [facility], [place] [address] [place], [place] [address]    Report Status 12/05/2017 FINAL  Final  Respiratory Panel by PCR     Status: None    Collection Time: 12/04/17  3:40 AM  Result Value Ref Range Status   Adenovirus NOT DETECTED NOT DETECTED Final   Coronavirus 229E NOT DETECTED NOT DETECTED Final   Coronavirus HKU1 NOT DETECTED NOT DETECTED Final   Coronavirus NL63 NOT DETECTED NOT DETECTED Final   Coronavirus OC43 NOT DETECTED NOT DETECTED Final   Metapneumovirus NOT DETECTED NOT DETECTED Final   Rhinovirus / Enterovirus NOT DETECTED NOT DETECTED Final   Influenza A NOT DETECTED NOT DETECTED Final   Influenza B NOT DETECTED NOT DETECTED Final   Parainfluenza Virus 1 NOT DETECTED NOT DETECTED Final   Parainfluenza Virus 2 NOT DETECTED NOT DETECTED Final   Parainfluenza Virus 3 NOT DETECTED NOT DETECTED Final   Parainfluenza Virus 4 NOT DETECTED NOT DETECTED Final   Respiratory Syncytial Virus NOT DETECTED NOT DETECTED Final   Bordetella pertussis NOT DETECTED NOT DETECTED Final   Chlamydophila pneumoniae NOT DETECTED NOT DETECTED Final   Mycoplasma pneumoniae NOT DETECTED NOT DETECTED Final    Comment: Performed at [facility], [place] [address], [place], [place] [address]  MRSA PCR Screening     Status: None   Collection Time: 12/04/17  5:57 AM  Result Value Ref Range Status   MRSA by PCR NEGATIVE NEGATIVE Final    Comment:        The GeneXpert MRSA Assay (FDA approved for NASAL specimens only), is one component of a comprehensive MRSA colonization surveillance program. It is not intended to diagnose MRSA infection nor to guide or monitor treatment for MRSA infections. Performed at [facility], [address] [place], [place] [address]   Aerobic/Anaerobic Culture (surgical/deep wound)     Status: None (Preliminary result)   Collection Time: 12/05/17 10:11 AM  Result Value Ref Range Status   Specimen Description   Final    GROIN RIGHT Performed at [facility], [place] [address] [place], [place] [address]    Special Requests   Final    Immunocompromised Performed at [facility], [place], [place] [address]    Gram Stain   Final    RARE WBC PRESENT, PREDOMINANTLY PMN MODERATE GRAM NEGATIVE RODS FEW GRAM POSITIVE COCCI IN PAIRS FEW GRAM POSITIVE RODS Performed at [facility], [place] [address] [place], [place] [address]    Culture PENDING  Incomplete   Report Status PENDING  Incomplete    Coagulation Studies: Recent Labs    12/04/17 0517  LABPROT 14.3  INR 1.12    Urinalysis: Recent  Labs    12/04/17 0240  COLORURINE YELLOW*  LABSPEC 1.011  PHURINE 6.0  GLUCOSEU >=500*  HGBUR NEGATIVE  BILIRUBINUR NEGATIVE  KETONESUR NEGATIVE  PROTEINUR 100*  NITRITE NEGATIVE  LEUKOCYTESUR NEGATIVE      Imaging: No results found.   Medications:   . sodium chloride    . DAPTOmycin (CUBICIN)  IV Stopped (12/04/17 1537)  . meropenem (MERREM) IV Stopped (12/05/17 1830)   . amLODipine  5 mg Oral Daily  . aspirin EC  81 mg Oral Daily  . atorvastatin  80 mg Oral Daily  . calcitRIOL  0.5 mcg Oral Daily  . carvedilol  12.5 mg Oral BID  . clopidogrel  75 mg Oral Daily  . docusate sodium  100 mg Oral BID  . ferrous sulfate  325 mg Oral Daily  . furosemide  80 mg Oral BID  . heparin  5,000 Units Subcutaneous Q8H  . insulin aspart  0-15 Units Subcutaneous TID WC  . insulin aspart  0-5 Units Subcutaneous QHS  . insulin aspart  3 Units Subcutaneous TID WC  . insulin glargine  20 Units Subcutaneous Q2200  . levETIRAcetam  1,000 mg Oral QHS  . lisinopril  10 mg Oral Daily  . mirtazapine  7.5 mg Oral QHS  . sevelamer carbonate  800 mg Oral TID WC  . sodium bicarbonate  650 mg Oral BID  . sodium chloride flush  3 mL Intravenous Q12H   sodium chloride, acetaminophen **OR** acetaminophen, ondansetron **OR** ondansetron (ZOFRAN) IV, polyethylene glycol, sodium chloride flush, triamcinolone ointment  Assessment/ Plan:  57 y.o. male with past medical history of ESRD on HD MWF followed by [facility] nephrology, diabetes mellitus type 2, history  of DVT, right below the knee amputation, hypertension, history of intracerebral hemorrhage, anemia of chronic kidney disease, secondary hyperparathyroidism, seizure disorder who presents now with fever.  [facility] nephrology/[address]/MWF.  1.  ESRD on HD MWF.  Patient could not get dialysis due to access malfunction Because of ongoing abscess, a temporary dialysis catheter will be placed until infection is adequately controlled.  He will then get a new tunneled dialysis catheter later in the week  2.  Fever.  Influenza screen was found to be negative.  Chest x-ray negative for obvious pneumonia.   Potential source of right groin abscess.  Patient is currently getting IV antibiotics WBC count elevated at 14.0  3.  Anemia of chronic kidney disease.  Hemoglobin currently 10.1.   We will resume low-dose EPO with hemodialysis  4.  Secondary hyperparathyroidism.  Monitor phosphorus.  Maintain the patient on Renvela 800 mg p.o. 3 times daily.   LOS: 2 [name] [name] 2/19/201912:02 PM

## 2017-12-06 NOTE — Progress Notes (Signed)
Discussed case with Dr. Delana Meyer Patient has a nonfunctioning PermCath but also a right groin access and is getting IV antibiotics for it. Plan to remove PermCath and culture the tip Weight 1-2 days to ensure that the cath tip culture is negative Patient can then have PermCath and avoid an extra procedure of having a temporary dialysis catheter If in the meantime, patient needs urgent dialysis, then we will consider temporary catheter

## 2017-12-06 NOTE — Op Note (Signed)
  OPERATIVE NOTE   PROCEDURE: 1. Removal of a right IJ tunneled dialysis catheter  PRE-OPERATIVE DIAGNOSIS: Complication of dialysis catheter, End stage renal disease  POST-OPERATIVE DIAGNOSIS: Same  SURGEON: Hortencia Pilar, M.D.  ANESTHESIA: Local anesthetic with 1% lidocaine with epinephrine   ESTIMATED BLOOD LOSS: Minimal   FINDING(S): 1. Catheter intact   SPECIMEN(S):  Catheter  INDICATIONS:   [name] is a 57 y.o. male who presents with signs of sepsis.  He is therefore undergoing removal of his tunneled catheter to avoid septic complications.   DESCRIPTION: After obtaining full informed written consent, the patient was positioned supine. The right IJ catheter and surrounding area is prepped and draped in a sterile fashion. The cuff was localized by palpation and noted to be less than 3 cm from the exit site. After appropriate timeout is called, 1% lidocaine with epinephrine is infiltrated into the surrounding tissues around the cuff. Small transverse incision is created at the exit site with an 11 blade scalpel and the dissection was carried up along the catheter to expose the cuff of the tunneled catheter.  The catheter cuff is then freed from the surrounding attachments and adhesions. Once the catheter has been freed circumferentially it is removed in 1 piece. Light pressure was held at the base of the neck.   Antibiotic ointment and a sterile dressing is applied to the exit site. Patient tolerated procedure well and there were no complications.  COMPLICATIONS: None  CONDITION: Unchanged  Hortencia Pilar, M.D. [facility] Office: [phone]  12/06/2017,7:53 PM

## 2017-12-06 NOTE — Telephone Encounter (Signed)
Sela Hua from [facility] had called office to let Dr. Caryn Section know that patient was scheduled to have PT today but he was notified that patient has been in hospital since Saturday. He wanted Dr. Caryn Section to be aware of this if he hasn't already seen hospital note. KW

## 2017-12-06 NOTE — Progress Notes (Addendum)
Pt blood sugar 48. Notified Dr Leslye Peer. Ordered to push an amp of D50. CBG rechecked 15 minutes after administering D50. CBG now 139.   Patient was kept NPO on night shift for possible surgery. Verified with DR Leslye Peer that patient would not be going to surgery. Patient to continue with Renal diet with 1200 fluid restriction. Breakfast ordered.

## 2017-12-07 LAB — GLUCOSE, CAPILLARY
Glucose-Capillary: 204 mg/dL — ABNORMAL HIGH (ref 65–99)
Glucose-Capillary: 131 mg/dL — ABNORMAL HIGH (ref 65–99)
Glucose-Capillary: 41 mg/dL — CL (ref 65–99)
Glucose-Capillary: 216 mg/dL — ABNORMAL HIGH (ref 65–99)
Glucose-Capillary: 202 mg/dL — ABNORMAL HIGH (ref 65–99)
Glucose-Capillary: 148 mg/dL — ABNORMAL HIGH (ref 65–99)

## 2017-12-07 MED ORDER — NEPRO/CARBSTEADY PO LIQD
237.0000 mL | Freq: Two times a day (BID) | ORAL | Status: DC
  Administered 2017-12-07 – 2017-12-08 (×3): 237 mL via ORAL

## 2017-12-07 MED ORDER — DEXTROSE 50 % IV SOLN
INTRAVENOUS | Status: AC
  Filled 2017-12-07: qty 50

## 2017-12-07 MED ORDER — RENA-VITE PO TABS
1.0000 | ORAL_TABLET | Freq: Every day | ORAL | Status: DC
  Administered 2017-12-07 – 2017-12-11 (×5): 1 via ORAL
  Filled 2017-12-07 (×5): qty 1

## 2017-12-07 MED ORDER — DEXTROSE 50 % IV SOLN
1.0000 | Freq: Once | INTRAVENOUS | Status: AC
  Administered 2017-12-07: 50 mL via INTRAVENOUS

## 2017-12-07 NOTE — Progress Notes (Addendum)
Inpatient Diabetes Program Recommendations  AACE/ADA: New Consensus Statement on Inpatient Glycemic Control (2015)  Target Ranges:  Prepandial:   less than 140 mg/dL      Peak postprandial:   less than 180 mg/dL (1-2 hours)      Critically ill patients:  140 - 180 mg/dL   Results for Washburn, [name] (MRN [id number]) as of 12/07/2017 09:59  Ref. Range 12/06/2017 07:39 12/06/2017 08:20 12/06/2017 11:45 12/06/2017 20:57  Glucose-Capillary Latest Ref Range: 65 - 99 mg/dL 48 (L) 139 (H) 130 (H) 204 (H)   Results for Smith, [name] (MRN [id number]) as of 12/07/2017 09:59  Ref. Range 12/07/2017 07:38  Glucose-Capillary Latest Ref Range: 65 - 99 mg/dL 41 (LL)     Diabetes history: Type 2 with ESRD  Outpatient DM meds: Novolog 8-15 units tid with meals    Lantus 40 units qhs  **DM Coordinator Spoke to patient at the bedside yesterday regarding Lantus and Novolog insulin - he told our team he is taking Lantus 0-17 units qhs and Novolog 0-12 units   Current Orders: Lantus 17 units QHS   Novolog Moderate Correction Scale/ SSI (0-15 units) TID AC + HS   Novolog 3 units TID with meals        MD- Note that patient received 40 units Lantus on the PM of 02/18.  Woke up 02/19 with severe Hypoglycemia.  Please note that Lantus dose was HELD last PM (12/06/17) (per [facility])  Patient still with Hypoglycemia this AM despite Lantus being HELD last PM.  May consider further reducing Lantus dose to 10 units QHS (per patient, he is taking anywhere from 0 to 17 units Lantus at bedtime at home)     --Will follow patient during hospitalization--  Wyn Quaker RN, MSN, CDE Diabetes Coordinator Inpatient Glycemic Control Team Team Pager: [phone] (8a-5p)

## 2017-12-07 NOTE — Progress Notes (Signed)
Patient ID: [name], male   DOB: 06/27/1961, 57 y.o.   MRN: [id number]  [facility] [employer] PROGRESS NOTE  [name] FBP:[id number] DOB: 03-29-1961 DOA: 12/04/2017 PCP: [name], MD  HPI/Subjective: Patient feels well and was asking when he can go home.  Right groin still has a little bit of drainage   No further fever. Is completely alert and oriented.  Objective: Vitals:   12/07/17 0507 12/07/17 1504  BP: (!) 161/71 (!) 144/76  Pulse: 63 66  Resp: 20 18  Temp: 97.7 F (36.5 C) 98.3 F (36.8 C)  SpO2: 100% 99%    Filed Weights   12/04/17 0040 12/04/17 0519 12/06/17 0600  Weight: 80.7 kg (178 lb) 80.3 kg (177 lb) 79.5 kg (175 lb 4.3 oz)    ROS: Review of Systems  Constitutional: Negative for chills and fever.  Eyes: Negative for blurred vision.  Respiratory: Negative for cough and shortness of breath.   Cardiovascular: Negative for chest pain.  Gastrointestinal: Negative for abdominal pain, constipation, diarrhea, nausea and vomiting.  Genitourinary: Negative for dysuria.  Musculoskeletal: Negative for joint pain.  Neurological: Negative for dizziness and headaches.   Exam: Physical Exam  Constitutional: He is oriented to person, place, and time.  HENT:  Nose: No mucosal edema.  Mouth/Throat: No oropharyngeal exudate or posterior oropharyngeal edema.  Eyes: Conjunctivae, EOM and lids are normal. Pupils are equal, round, and reactive to light.  Neck: No JVD present. Carotid bruit is not present. No edema present. No thyroid mass and no thyromegaly present.  Cardiovascular: S1 normal and S2 normal. Exam reveals no gallop.  No murmur heard. Pulses:      Dorsalis pedis pulses are 2+ on the right side, and 2+ on the left side.  Respiratory: No respiratory distress. He has no wheezes. He has no rhonchi. He has no rales.  GI: Soft. Bowel sounds are normal. There is no tenderness.  Musculoskeletal:       Right knee: He exhibits no swelling.       Left  ankle: He exhibits no swelling.  Lymphadenopathy:    He has no cervical adenopathy.  Neurological: He is alert and oriented to person, place, and time. No cranial nerve deficit.  Skin: Skin is warm. Nails show no clubbing.  Right groin looks like some chronic changes and some scarring.  Still with some drainage. Right scrotum has some induration and thickening in the skin.  Psychiatric: He has a normal mood and affect.      Data Reviewed: Basic Metabolic Panel: Recent Labs  Lab 12/04/17 0042 12/04/17 0517  NA 139  --   K 4.1  --   CL 100*  --   CO2 23  --   GLUCOSE 200*  --   BUN 44*  --   CREATININE 6.95* 7.10*  CALCIUM 9.3  --    Liver Function Tests: Recent Labs  Lab 12/01/17 1158 12/04/17 0042  AST 9 11*  ALT 6 10*  ALKPHOS 89 87  BILITOT 0.2 0.5  PROT 7.3 8.8*  ALBUMIN 3.8 3.9    Recent Labs  Lab 12/04/17 0517  AMMONIA 29   CBC: Recent Labs  Lab 12/04/17 0042 12/04/17 0517  WBC 16.8* 14.0*  NEUTROABS 15.4*  --   HGB 11.7* 10.1*  HCT 37.4* 32.2*  MCV 88.2 89.0  PLT 283 242   Cardiac Enzymes: Recent Labs  Lab 12/04/17 0042  TROPONINI 0.05*   BNP (last 3 results) Recent Labs  05/19/17 1522 12/04/17 0042  BNP 157.0* 177.0*    CBG: Recent Labs  Lab 12/06/17 1145 12/06/17 2057 12/07/17 0738 12/07/17 0808 12/07/17 1206  GLUCAP 130* 204* 41* 131* 216*    Recent Results (from the past 240 hour(s))  Blood Culture (routine x 2)     Status: None (Preliminary result)   Collection Time: 12/04/17 12:42 AM  Result Value Ref Range Status   Specimen Description BLOOD RIGHT FOREARM  Final   Special Requests   Final    BOTTLES DRAWN AEROBIC AND ANAEROBIC Blood Culture adequate volume   Culture   Final    NO GROWTH 3 DAYS Performed at [facility], [address], [place], [place] [address]    Report Status PENDING  Incomplete  Blood Culture (routine x 2)     Status: None (Preliminary result)   Collection Time: 12/04/17 12:42  AM  Result Value Ref Range Status   Specimen Description BLOOD RIGHT WRIST  Final   Special Requests   Final    BOTTLES DRAWN AEROBIC AND ANAEROBIC Blood Culture adequate volume   Culture   Final    NO GROWTH 3 DAYS Performed at [facility], [address], [place], [place] [address]    Report Status PENDING  Incomplete  Urine culture     Status: None   Collection Time: 12/04/17  2:40 AM  Result Value Ref Range Status   Specimen Description   Final    URINE, RANDOM Performed at [facility], [address] [place], [place] [address]    Special Requests   Final    NONE Performed at [facility], [address], [place], [place] [address]    Culture   Final    NO GROWTH Performed at [facility], [place] [address] [place], [place] [address]    Report Status 12/05/2017 FINAL  Final  Respiratory Panel by PCR     Status: None   Collection Time: 12/04/17  3:40 AM  Result Value Ref Range Status   Adenovirus NOT DETECTED NOT DETECTED Final   Coronavirus 229E NOT DETECTED NOT DETECTED Final   Coronavirus HKU1 NOT DETECTED NOT DETECTED Final   Coronavirus NL63 NOT DETECTED NOT DETECTED Final   Coronavirus OC43 NOT DETECTED NOT DETECTED Final   Metapneumovirus NOT DETECTED NOT DETECTED Final   Rhinovirus / Enterovirus NOT DETECTED NOT DETECTED Final   Influenza A NOT DETECTED NOT DETECTED Final   Influenza B NOT DETECTED NOT DETECTED Final   Parainfluenza Virus 1 NOT DETECTED NOT DETECTED Final   Parainfluenza Virus 2 NOT DETECTED NOT DETECTED Final   Parainfluenza Virus 3 NOT DETECTED NOT DETECTED Final   Parainfluenza Virus 4 NOT DETECTED NOT DETECTED Final   Respiratory Syncytial Virus NOT DETECTED NOT DETECTED Final   Bordetella pertussis NOT DETECTED NOT DETECTED Final   Chlamydophila pneumoniae NOT DETECTED NOT DETECTED Final   Mycoplasma pneumoniae NOT DETECTED NOT DETECTED Final    Comment: Performed at [facility], [place]  [address] [place], [place] [address]  MRSA PCR Screening     Status: None   Collection Time: 12/04/17  5:57 AM  Result Value Ref Range Status   MRSA by PCR NEGATIVE NEGATIVE Final    Comment:        The GeneXpert MRSA Assay (FDA approved for NASAL specimens only), is one component of a comprehensive MRSA colonization surveillance program. It is not intended to diagnose MRSA infection nor to guide or  monitor treatment for MRSA infections. Performed at [facility], [address] [place], [place] [address]   Aerobic/Anaerobic Culture (surgical/deep wound)     Status: None (Preliminary result)   Collection Time: 12/05/17 10:11 AM  Result Value Ref Range Status   Specimen Description   Final    GROIN RIGHT Performed at [facility], [place] [address] [place], [place] [address]    Special Requests   Final    Immunocompromised Performed at [facility], [place], [place] [address]    Gram Stain   Final    RARE WBC PRESENT, PREDOMINANTLY PMN MODERATE GRAM NEGATIVE RODS FEW GRAM POSITIVE COCCI IN PAIRS FEW GRAM POSITIVE RODS Performed at [facility], [place] [address], [place], [place] [address]    Culture   Final    NORMAL SKIN FLORA NO ANAEROBES ISOLATED; CULTURE IN PROGRESS FOR 5 DAYS    Report Status PENDING  Incomplete  Cath Tip Culture     Status: None (Preliminary result)   Collection Time: 12/06/17  5:41 PM  Result Value Ref Range Status   Specimen Description   Final    CATH TIP Performed at [facility], [address], [place], [place] [address]    Special Requests   Final    NONE Performed at [facility], [address] [place], [place] [address]    Culture   Final    NO GROWTH < 24 HOURS Performed at [facility], [place] [address] [place], [place] [address]    Report Status PENDING  Incomplete     Studies: Ct Pelvis W Contrast  Result Date: 12/06/2017 CLINICAL DATA:  57 year old male  presenting with signs of sepsis. EXAM: CT PELVIS WITH CONTRAST TECHNIQUE: Multidetector CT imaging of the pelvis was performed using the standard protocol following the bolus administration of intravenous contrast. CONTRAST:  180m ISOVUE-300 IOPAMIDOL (ISOVUE-300) INJECTION 61% COMPARISON:  None. FINDINGS: Urinary Tract: Nondistended urinary bladder which may account for the slight circumferential thickened appearance of the bladder wall. Otherwise, consider cystitis. No distal uropathy is noted. No focal mural thickening of the bladder. Bowel:  No bowel obstruction or inflammation.  Normal appendix. Vascular/Lymphatic: Extensive aortoiliac and branch vessel atherosclerosis query diabetes. Small bilateral inguinal lymph nodes are identified, the largest on the right measuring 9 mm short axis and on the left 6 mm short axis. No operator, iliac chain or para-aortic lymphadenopathy. Reproductive:  Enlarged prostate measuring 4.9 x 6.1 x 4.5 cm. Other:  No free air nor free fluid. Musculoskeletal: Degenerative disc disease L4-5 with vacuum disc phenomenon. No acute nor suspicious osseous abnormality. Bone island noted of the right iliac bone adjacent to the SI joint. IMPRESSION: 1. No pathologically enlarged lymph nodes. Small subcentimeter short axis inguinal nodes are seen bilaterally. 2. Enlarged prostate consistent. 3. Extensive atherosclerotic vascular calcifications of the aortoiliac and bifemoral arteries as well as the branch vessels. Findings can be seen in diabetics. Electronically Signed   By: DAshley RoyaltyM.D.   On: 12/06/2017 20:51    Scheduled Meds: . amLODipine  5 mg Oral Daily  . aspirin EC  81 mg Oral Daily  . atorvastatin  80 mg Oral Daily  . calcitRIOL  0.5 mcg Oral Daily  . carvedilol  12.5 mg Oral BID  . clopidogrel  75 mg Oral Daily  . docusate sodium  100 mg Oral BID  . feeding supplement (NEPRO CARB STEADY)  237 mL Oral BID BM  .  ferrous sulfate  325 mg Oral Daily  . furosemide  80  mg Oral BID  . heparin  5,000 Units Subcutaneous Q8H  . insulin aspart  0-15 Units Subcutaneous TID WC  . insulin aspart  0-5 Units Subcutaneous QHS  . insulin aspart  3 Units Subcutaneous TID WC  . levETIRAcetam  1,000 mg Oral QHS  . lisinopril  10 mg Oral Daily  . mirtazapine  7.5 mg Oral QHS  . multivitamin  1 tablet Oral QHS  . sevelamer carbonate  800 mg Oral TID WC  . sodium bicarbonate  650 mg Oral BID  . sodium chloride flush  3 mL Intravenous Q12H   Continuous Infusions: . sodium chloride    . DAPTOmycin (CUBICIN)  IV Stopped (12/04/17 1537)  . meropenem (MERREM) IV Stopped (12/06/17 1937)    Assessment/Plan:  1. Right groin / scrotal abscess and boil.  This could be the source of the patient's fever. Patient on daptomycin and meropenem.  Follow-up cultures- negative.  So far blood cultures are negative.  Appreciate surgical evaluation.  No surgery needed as per surgery. 2. Dialysis catheter dysfunction.        Catheter is removed by vascular and the tape is sent for culture, placing the new catheter depending on the culture results and his improvement. 3.  Acute encephalopathy this has improved. 3. End-stage renal disease.  Dialysis as per nephrology 4. Essential hypertension continue usual medications 5. History of seizure on Keppra 6. Hyperlipidemia unspecified on atorvastatin 7. Type 2 diabetes mellitus on sliding scale and glargine insulin     As he had hypoglycemia in the early morning last 2 days, stopped his long-acting insulin.  Code Status:     Code Status Orders  (From admission, onward)        Start     Ordered   12/04/17 0511  Full code  Continuous     12/04/17 0511    Code Status History    Date Active Date Inactive Code Status Order ID Comments User Context   08/22/2017 12:44 08/22/2017 18:00 Full Code [id number]  Algernon Huxley, MD Inpatient   04/21/2017 18:19 04/25/2017 19:15 Full Code [id number]  Dustin Flock, MD Inpatient   11/06/2015 20:35  11/07/2015 18:18 Full Code [id number]  Aldean Jewett, MD ED     Family Communication: Spoke with wife on the phone . Disposition Plan: home once catheter replaced.  This can potentially be done on Thursday  Consultants:  Nephrology  General surgery  Antibiotics:  Meropenem  Cubicin  Time spent: 25 minutes  [employer]

## 2017-12-07 NOTE — Progress Notes (Incomplete)
Initial Nutrition Assessment  DOCUMENTATION CODES:      INTERVENTION:  ***   NUTRITION DIAGNOSIS:     related to   as evidenced by  .  ***  GOAL:      ***  MONITOR:      REASON FOR ASSESSMENT:        ASSESSMENT:      ***   57 yo male admitted 2/17 with acute encephalopathy, sepsis, infected catheter sites, and pressure injuries. PMH ESRD, T2DM, HTN, Crohn's, CVA, Hidradenitis suppurativa.   1/3-- Rt BKA secondary to PVD  Patient Active Problem List   Diagnosis Date Noted  . Encephalopathy 12/04/2017  . Pressure injury of skin 12/04/2017  . S/P BKA (below knee amputation) unilateral, right ([place]) 10/20/2017  . Atherosclerosis of native arteries of extremity with rest pain ([place]) 08/05/2017  . Empyema ([place]) 05/20/2017  . Peritonitis ([place]) 04/21/2017  . History of CVA (cerebrovascular accident) 04/15/2017  . Seizure ([place]) 04/15/2017  . End stage renal failure on dialysis ([name]) 04/12/2016  . Aphthae 02/20/2016  . Hidradenitis 02/20/2016  . Leg pain 02/20/2016  . Microalbuminuria 02/20/2016  . Neuropathy 02/20/2016  . Chest pain 02/20/2016  . Pyogenic arthritis of knee ([place]) 02/04/2016  . Narrowing of intervertebral disc space 08/29/2015  . Vascular disorder of lower extremity 08/29/2015  . Failure of erection 08/29/2015  . Accumulation of fluid in tissues 08/29/2015  . Hypercholesteremia 08/29/2015  . Hypertension 08/29/2015  . Anemia due to chronic kidney disease 05/26/2015  . Venous insufficiency of leg 09/04/2014  . Deep vein thrombosis of lower extremity ([place]) 08/16/2014  . Prostatic intraepithelial neoplasia 11/02/2013  . Elevated prostate specific antigen (PSA) 09/11/2013  . Benign prostatic hyperplasia with urinary obstruction 08/13/2013  . Spermatocele 08/13/2013  . Avitaminosis D 01/25/2013  . Abnormal presence of protein in urine 03/28/2012  . Type 2 diabetes mellitus with kidney complication, with long-term current use of insulin ([place])  03/28/2012  . Crohn's disease ([place]) 08/03/2011  -  Lines/Drains/Wounds:  Subclavian HD catheter placed 2/17 Dyfunctional PD catheter Stage II pressure injury  Medications:  Calcitriol Colace Ferrous sulfate Lasix Insulin MS, HS, at meals lantus keppra RENVELA Sodium bicarbonate NaCl flush + infusion IV Merrem (possible side effects: nausea, vomiting, diarrhea, constipation)   Labs:  CBG's 204, 41, 131, 216 Cr 7.1 (H) RBC 10.1 (L)  HGB 32.2 (L)      NUTRITION - FOCUSED PHYSICAL EXAM:  {RD Focused Exam List:21252}  Diet Order:  Diet renal with fluid restriction Fluid restriction: 1200 mL Fluid; Room service appropriate? Yes; Fluid consistency: Thin Aspiration precautions Fall precautions  EDUCATION NEEDS:      Skin:  Skin Assessment: Skin Integrity Issues: Skin Integrity Issues:: Stage II, Other (Comment) Stage II: sacrum Other: infected PD catheter  Last BM:  2/19  Height:   Ht Readings from Last 1 Encounters:  12/04/17 5\' 11"  (1.803 m)    Weight:   Wt Readings from Last 1 Encounters:  12/06/17 175 lb 4.3 oz (79.5 kg)    Ideal Body Weight:     BMI:  Body mass index is 24.44 kg/m.  Estimated Nutritional Needs:   Kcal:     Protein:     Fluid:       ***

## 2017-12-08 ENCOUNTER — Ambulatory Visit: Payer: BLUE CROSS/BLUE SHIELD | Admitting: Nurse Practitioner

## 2017-12-08 LAB — BASIC METABOLIC PANEL
Anion gap: 17 — ABNORMAL HIGH (ref 5–15)
BUN: 96 mg/dL — ABNORMAL HIGH (ref 6–20)
CO2: 19 mmol/L — ABNORMAL LOW (ref 22–32)
Calcium: 9.1 mg/dL (ref 8.9–10.3)
Chloride: 106 mmol/L (ref 101–111)
Creatinine, Ser: 11.51 mg/dL — ABNORMAL HIGH (ref 0.61–1.24)
GFR calc Af Amer: 5 mL/min — ABNORMAL LOW (ref >60)
GFR calc non Af Amer: 4 mL/min — ABNORMAL LOW (ref >60)
Glucose, Bld: 131 mg/dL — ABNORMAL HIGH (ref 65–99)
Potassium: 5.3 mmol/L — ABNORMAL HIGH (ref 3.5–5.1)
Sodium: 142 mmol/L (ref 135–145)

## 2017-12-08 LAB — CBC
HCT: 30.1 % — ABNORMAL LOW (ref 40.0–52.0)
Hemoglobin: 9.7 g/dL — ABNORMAL LOW (ref 13.0–18.0)
MCH: 28.1 pg (ref 26.0–34.0)
MCHC: 32.3 g/dL (ref 32.0–36.0)
MCV: 87 fL (ref 80.0–100.0)
Platelets: 209 10*3/uL (ref 150–440)
RBC: 3.46 MIL/uL — ABNORMAL LOW (ref 4.40–5.90)
RDW: 17.6 % — ABNORMAL HIGH (ref 11.5–14.5)
WBC: 5.6 10*3/uL (ref 3.8–10.6)

## 2017-12-08 LAB — GLUCOSE, CAPILLARY
Glucose-Capillary: 121 mg/dL — ABNORMAL HIGH (ref 65–99)
Glucose-Capillary: 139 mg/dL — ABNORMAL HIGH (ref 65–99)
Glucose-Capillary: 181 mg/dL — ABNORMAL HIGH (ref 65–99)
Glucose-Capillary: 198 mg/dL — ABNORMAL HIGH (ref 65–99)

## 2017-12-08 MED ORDER — PATIROMER SORBITEX CALCIUM 8.4 G PO PACK
8.4000 g | PACK | Freq: Once | ORAL | Status: AC
  Administered 2017-12-08: 8.4 g via ORAL
  Filled 2017-12-08: qty 4

## 2017-12-08 MED ORDER — COLLAGENASE 250 UNIT/GM EX OINT
TOPICAL_OINTMENT | Freq: Every day | CUTANEOUS | Status: DC
  Administered 2017-12-08 – 2017-12-11 (×4): via TOPICAL
  Filled 2017-12-08 (×2): qty 30

## 2017-12-08 NOTE — Consult Note (Addendum)
Pharmacy Antibiotic Note  [name] is a 57 y.o. male admitted on 12/04/2017 with sepsis.  Pharmacy has been consulted for vancomycin and merropenem dosing. Patient has a vancomycin allergy. Pt has right groin abscess and boil. Pt with fever. Permcath has been removed and cultured NG <24hr. Wound cx grew normal skin flora. Bcx NG.  Plan: Continue  meropenem 500 mg IV q24h per hemodialysis dosing (to be given after dialysis on dialysis days) patient has HD on MWF.  Continue Daptomycin  6 mg/kg IV once every 48 hours following hemodialysis on hemodialysis days.    Follow up on cath tip cx Pt feeling better, no fevers-consider deescalation of abx   Height: 5\' 11"  (180.3 cm) Weight: 175 lb 4.3 oz (79.5 kg) IBW/kg (Calculated) : 75.3  Temp (24hrs), Avg:98.2 F (36.8 C), Min:97.7 F (36.5 C), Max:98.6 F (37 C)  Recent Labs  Lab 12/04/17 0042 12/04/17 0517 12/08/17 0320  WBC 16.8* 14.0* 5.6  CREATININE 6.95* 7.10* 11.51*  LATICACIDVEN 1.4  --   --     Estimated Creatinine Clearance: 7.7 mL/min (A) (by C-G formula based on SCr of 11.51 mg/dL (H)).    Allergies  Allergen Reactions  . Vancomycin Shortness Of Breath    Eyes watering, SOB, wheezing  . Cefepime Other (See Comments)  . Methotrexate Other (See Comments)    Blood count drops  . Tape     Antimicrobials this admission: 2/17 meropenem >>  2/17 dapto >>   Dose adjustments this admission:   Microbiology results: 2/17 BCx: NG 5 days Wound cx: normal flora Cath tip NG <24hr  Thank you for allowing pharmacy to be a part of this patient's care.  Ramond Dial, Pharm.D, BCPS Clinical Pharmacist  12/08/2017 11:04 AM

## 2017-12-08 NOTE — Progress Notes (Signed)
[facility]  ROUNDING NOTE   Subjective:  Patient known to [place] from prior admissions.   He undergoes dialysis on Monday, Wednesday, Friday. Patient brought in by his wife for fever. His influenza screen was found to be negative. Source of fever is thought to be rt groin abscess  Permcath malfunctioned. It was removed 2/21 Denies any acute c/o   Objective:  Vital signs in last 24 hours:  Temp:  [97.7 F (36.5 C)-98.6 F (37 C)] 97.7 F (36.5 C) (02/21 0539) Pulse Rate:  [64-69] 66 (02/21 1116) Resp:  [18-20] 18 (02/21 0539) BP: (144-163)/(72-82) 163/73 (02/21 1116) SpO2:  [98 %-100 %] 98 % (02/21 0539)  Weight change:  Filed Weights   12/04/17 0040 12/04/17 0519 12/06/17 0600  Weight: 80.7 kg (178 lb) 80.3 kg (177 lb) 79.5 kg (175 lb 4.3 oz)    Intake/Output: I/O last 3 completed shifts: In: 96 [P.O.:720] Out: 0    Intake/Output this shift:  Total I/O In: 240 [P.O.:240] Out: 300 [Urine:300]  Physical Exam: General: No acute distress  Head: Normocephalic, atraumatic. Moist oral mucosal membranes  Eyes: Anicteric  Neck: Supple, trachea midline  Lungs:  Clear to auscultation, normal effort  Heart: S1S2 no rubs  Abdomen:  Soft, nontender, bowel sounds present,   Extremities: R BKA, trace LE edema  Neurologic: Awake, alert, following commands  Skin: right groin abscess  Access:      Basic Metabolic Panel: Recent Labs  Lab 12/04/17 0042 12/04/17 0517 12/08/17 0320  NA 139  --  142  K 4.1  --  5.3*  CL 100*  --  106  CO2 23  --  19*  GLUCOSE 200*  --  131*  BUN 44*  --  96*  CREATININE 6.95* 7.10* 11.51*  CALCIUM 9.3  --  9.1    Liver Function Tests: Recent Labs  Lab 12/04/17 0042  AST 11*  ALT 10*  ALKPHOS 87  BILITOT 0.5  PROT 8.8*  ALBUMIN 3.9   No results for input(s): LIPASE, AMYLASE in the last 168 hours. Recent Labs  Lab 12/04/17 0517  AMMONIA 29    CBC: Recent Labs  Lab 12/04/17 0042 12/04/17 0517  12/08/17 0320  WBC 16.8* 14.0* 5.6  NEUTROABS 15.4*  --   --   HGB 11.7* 10.1* 9.7*  HCT 37.4* 32.2* 30.1*  MCV 88.2 89.0 87.0  PLT 283 242 209    Cardiac Enzymes: Recent Labs  Lab 12/04/17 0042  TROPONINI 0.05*    BNP: Invalid input(s): POCBNP  CBG: Recent Labs  Lab 12/07/17 1206 12/07/17 1801 12/07/17 2109 12/08/17 0804 12/08/17 1141  GLUCAP 216* 202* 148* 121* 139*    Microbiology: Results for orders placed or performed during the hospital encounter of 12/04/17  Blood Culture (routine x 2)     Status: None (Preliminary result)   Collection Time: 12/04/17 12:42 AM  Result Value Ref Range Status   Specimen Description BLOOD RIGHT FOREARM  Final   Special Requests   Final    BOTTLES DRAWN AEROBIC AND ANAEROBIC Blood Culture adequate volume   Culture   Final    NO GROWTH 4 DAYS Performed at [facility], [address] [place], [place] [address]    Report Status PENDING  Incomplete  Blood Culture (routine x 2)     Status: None (Preliminary result)   Collection Time: 12/04/17 12:42 AM  Result Value Ref Range Status   Specimen Description BLOOD RIGHT WRIST  Final   Special  Requests   Final    BOTTLES DRAWN AEROBIC AND ANAEROBIC Blood Culture adequate volume   Culture   Final    NO GROWTH 4 DAYS Performed at [facility], [address] [place], [place] [address]    Report Status PENDING  Incomplete  Urine culture     Status: None   Collection Time: 12/04/17  2:40 AM  Result Value Ref Range Status   Specimen Description   Final    URINE, RANDOM Performed at [facility], [address], [place], [place] [address]    Special Requests   Final    NONE Performed at [facility], [address], [place], [place] [address]    Culture   Final    NO GROWTH Performed at [facility], [place] [address], [place], [place] [address]    Report Status 12/05/2017 FINAL  Final  Respiratory Panel by PCR     Status: None    Collection Time: 12/04/17  3:40 AM  Result Value Ref Range Status   Adenovirus NOT DETECTED NOT DETECTED Final   Coronavirus 229E NOT DETECTED NOT DETECTED Final   Coronavirus HKU1 NOT DETECTED NOT DETECTED Final   Coronavirus NL63 NOT DETECTED NOT DETECTED Final   Coronavirus OC43 NOT DETECTED NOT DETECTED Final   Metapneumovirus NOT DETECTED NOT DETECTED Final   Rhinovirus / Enterovirus NOT DETECTED NOT DETECTED Final   Influenza A NOT DETECTED NOT DETECTED Final   Influenza B NOT DETECTED NOT DETECTED Final   Parainfluenza Virus 1 NOT DETECTED NOT DETECTED Final   Parainfluenza Virus 2 NOT DETECTED NOT DETECTED Final   Parainfluenza Virus 3 NOT DETECTED NOT DETECTED Final   Parainfluenza Virus 4 NOT DETECTED NOT DETECTED Final   Respiratory Syncytial Virus NOT DETECTED NOT DETECTED Final   Bordetella pertussis NOT DETECTED NOT DETECTED Final   Chlamydophila pneumoniae NOT DETECTED NOT DETECTED Final   Mycoplasma pneumoniae NOT DETECTED NOT DETECTED Final    Comment: Performed at [facility], [place] [address], [place], [place] [address]  MRSA PCR Screening     Status: None   Collection Time: 12/04/17  5:57 AM  Result Value Ref Range Status   MRSA by PCR NEGATIVE NEGATIVE Final    Comment:        The GeneXpert MRSA Assay (FDA approved for NASAL specimens only), is one component of a comprehensive MRSA colonization surveillance program. It is not intended to diagnose MRSA infection nor to guide or monitor treatment for MRSA infections. Performed at [facility], [address] [place], [place] [address]   Aerobic/Anaerobic Culture (surgical/deep wound)     Status: None (Preliminary result)   Collection Time: 12/05/17 10:11 AM  Result Value Ref Range Status   Specimen Description   Final    GROIN RIGHT Performed at [facility], [place] [address] [place], [place] [address]    Special Requests   Final    Immunocompromised Performed at [facility], [place], [place] [address]    Gram Stain   Final    RARE WBC PRESENT, PREDOMINANTLY PMN MODERATE GRAM NEGATIVE RODS FEW GRAM POSITIVE COCCI IN PAIRS FEW GRAM POSITIVE RODS Performed at [facility], [place] [address] [place] [address], [place],  [address]    Culture   Final    NORMAL SKIN FLORA NO ANAEROBES ISOLATED; CULTURE IN PROGRESS FOR 5 DAYS    Report Status PENDING  Incomplete  Cath Tip Culture  Status: None (Preliminary result)   Collection Time: 12/06/17  5:41 PM  Result Value Ref Range Status   Specimen Description   Final    CATH TIP Performed at [facility], [address] [place], [place] [address]    Special Requests   Final    NONE Performed at [facility], [address], [place], [place] [address]    Culture   Final    NO GROWTH < 24 HOURS Performed at [facility], [place] [address], [place],  [address]    Report Status PENDING  Incomplete    Coagulation Studies: No results for input(s): LABPROT, INR in the last 72 hours.  Urinalysis: No results for input(s): COLORURINE, LABSPEC, PHURINE, GLUCOSEU, HGBUR, BILIRUBINUR, KETONESUR, PROTEINUR, UROBILINOGEN, NITRITE, LEUKOCYTESUR in the last 72 hours.  Invalid input(s): APPERANCEUR    Imaging: Ct Pelvis W Contrast  Result Date: 12/06/2017 CLINICAL DATA:  57 year old male presenting with signs of sepsis. EXAM: CT PELVIS WITH CONTRAST TECHNIQUE: Multidetector CT imaging of the pelvis was performed using the standard protocol following the bolus administration of intravenous contrast. CONTRAST:  171mL ISOVUE-300 IOPAMIDOL (ISOVUE-300) INJECTION 61% COMPARISON:  None. FINDINGS: Urinary Tract: Nondistended urinary bladder which may account for the slight circumferential thickened appearance of the bladder wall. Otherwise, consider cystitis. No distal uropathy is noted. No focal mural thickening of the bladder. Bowel:  No bowel obstruction or inflammation.  Normal  appendix. Vascular/Lymphatic: Extensive aortoiliac and branch vessel atherosclerosis query diabetes. Small bilateral inguinal lymph nodes are identified, the largest on the right measuring 9 mm short axis and on the left 6 mm short axis. No operator, iliac chain or para-aortic lymphadenopathy. Reproductive:  Enlarged prostate measuring 4.9 x 6.1 x 4.5 cm. Other:  No free air nor free fluid. Musculoskeletal: Degenerative disc disease L4-5 with vacuum disc phenomenon. No acute nor suspicious osseous abnormality. Bone island noted of the right iliac bone adjacent to the SI joint. IMPRESSION: 1. No pathologically enlarged lymph nodes. Small subcentimeter short axis inguinal nodes are seen bilaterally. 2. Enlarged prostate consistent. 3. Extensive atherosclerotic vascular calcifications of the aortoiliac and bifemoral arteries as well as the branch vessels. Findings can be seen in diabetics. Electronically Signed   By: Ashley Royalty M.D.   On: 12/06/2017 20:51     Medications:   . sodium chloride    . DAPTOmycin (CUBICIN)  IV 480 mg (12/08/17 1308)  . meropenem (MERREM) IV Stopped (12/07/17 1910)   . amLODipine  5 mg Oral Daily  . aspirin EC  81 mg Oral Daily  . atorvastatin  80 mg Oral Daily  . calcitRIOL  0.5 mcg Oral Daily  . carvedilol  12.5 mg Oral BID  . clopidogrel  75 mg Oral Daily  . collagenase   Topical Daily  . docusate sodium  100 mg Oral BID  . feeding supplement (NEPRO CARB STEADY)  237 mL Oral BID BM  . ferrous sulfate  325 mg Oral Daily  . furosemide  80 mg Oral BID  . heparin  5,000 Units Subcutaneous Q8H  . insulin aspart  0-15 Units Subcutaneous TID WC  . insulin aspart  0-5 Units Subcutaneous QHS  . insulin aspart  3 Units Subcutaneous TID WC  . levETIRAcetam  1,000 mg Oral QHS  . lisinopril  10 mg Oral Daily  . mirtazapine  7.5 mg Oral QHS  . multivitamin  1 tablet Oral QHS  . sevelamer carbonate  800 mg Oral TID WC  . sodium  bicarbonate  650 mg Oral BID  . sodium  chloride flush  3 mL Intravenous Q12H   sodium chloride, acetaminophen **OR** acetaminophen, ondansetron **OR** ondansetron (ZOFRAN) IV, polyethylene glycol, sodium chloride flush, triamcinolone ointment  Assessment/ Plan:  57 y.o. male with past medical history of ESRD on HD MWF followed by [facility] nephrology, diabetes mellitus type 2, history of DVT, right below the knee amputation, hypertension, history of intracerebral hemorrhage, anemia of chronic kidney disease, secondary hyperparathyroidism, seizure disorder who presents now with fever.  [facility] nephrology/[address]/MWF.  1.  ESRD on HD MWF.  Patient could not get dialysis due to access malfunction  Tunneled dialysis cathter to be placed tomorrow NPO after MN One dose of veltassa  2.  Fever.  Influenza screen was found to be negative.  Chest x-ray negative for obvious pneumonia.   Potential source of right groin abscess.  Patient is currently getting IV antibiotics WBC count normalized  3.  Anemia of chronic kidney disease.  Hemoglobin currently 9.7.   We will resume low-dose EPO with hemodialysis  4.  Secondary hyperparathyroidism.  Monitor phosphorus.  Maintain the patient on Renvela 800 mg p.o. 3 times daily.   LOS: [place] 2/21/20192:40 PM

## 2017-12-08 NOTE — Progress Notes (Signed)
Inpatient Diabetes Program Recommendations  AACE/ADA: New Consensus Statement on Inpatient Glycemic Control (2015)  Target Ranges:  Prepandial:   less than 140 mg/dL      Peak postprandial:   less than 180 mg/dL (1-2 hours)      Critically ill patients:  140 - 180 mg/dL   Lab Results  Component Value Date   GLUCAP 148 (H) 12/07/2017   HGBA1C 5.9 10/20/2017    Review of Glycemic Control  Results for [name], [name] (MRN [id number]) as of 12/08/2017 08:45  Ref. Range 12/07/2017 08:08 12/07/2017 12:06 12/07/2017 18:01 12/07/2017 21:09 12/08/2017 08:04  Glucose-Capillary Latest Ref Range: 65 - 99 mg/dL 131 (H) 216 (H) 202 (H) 148 (H) 121 (H)   Diabetes history:Type 2 with ESRD  Outpatient DM meds:                                        **DM Coordinator Spoke to patient at the bedside yesterday regarding Lantus and Novolog insulin - he told our team he is taking Lantus 0-17 units qhs and Novolog 0-12 units   Current Orders:  Novolog Moderate Correction Scale/ SSI (0-15 units) TID AC, Novolog 0-5 units qhs,  Novolog 3 units TID with meals   Please decrease Novolog correction to 0-9 units tid *poor renal function**  Gentry Fitz, RN, [place], , CDE Diabetes Coordinator Inpatient Diabetes Program  [phone] (Team Pager) [phone] ([place]) 12/08/2017 8:46 AM

## 2017-12-08 NOTE — Consult Note (Signed)
[place] Nurse wound consult note Reason for Consult:Unstageable pressure injury to sacrum, present on admission and seen at wound care center. Nonhealing full thickness injury to right AKA stump, seen at wound care center. Both wounds with 50% slough, will continue Santyl.  Wound type:pressure to sacrum and device related to right stump from prosthesis Pressure Injury POA: Yes Measurement: sacrum:  1 cm x 0.5 cm wound bed is 50% slough Right stump:  0.5 cm x 1 cm wound bed is 50% slough Wound ORV:IFBPPH Drainage (amount, consistency, odor) minimal serosanguinous Periwound:intact Dressing procedure/placement/frequency:Cleanse wounds to sacrum and right stump with NS and pat dry.  Apply santyl to wound bed. Cover with NS moist 2x2 and cover with silicone foam.  Peel back foam and replace santyl dressing daily.  Change foam every three days.  Will not follow at this time.  Please re-consult if needed.  Domenic Moras RN BSN [place] Pager [phone]

## 2017-12-08 NOTE — Progress Notes (Signed)
Patient ID: [name], male   DOB: [birth year], 57 y.o.   MRN: [id number]  [facility] PROGRESS NOTE  [name] HTD:[id number] DOB: 1961-04-05 DOA: 12/04/2017 PCP: [name], MD  HPI/Subjective: Patient feels well and was asking when he can go home.  Right groin still has a little bit of drainage   No further fever. Is completely alert and oriented.  Objective: Vitals:   12/08/17 1115 12/08/17 1116  BP: (!) 163/73 (!) 163/73  Pulse:  66  Resp:    Temp:    SpO2:      Filed Weights   12/04/17 0040 12/04/17 0519 12/06/17 0600  Weight: 80.7 kg (178 lb) 80.3 kg (177 lb) 79.5 kg (175 lb 4.3 oz)    ROS: Review of Systems  Constitutional: Negative for chills and fever.  Eyes: Negative for blurred vision.  Respiratory: Negative for cough and shortness of breath.   Cardiovascular: Negative for chest pain.  Gastrointestinal: Negative for abdominal pain, constipation, diarrhea, nausea and vomiting.  Genitourinary: Negative for dysuria.  Musculoskeletal: Negative for joint pain.  Neurological: Negative for dizziness and headaches.   Exam: Physical Exam  Constitutional: He is oriented to person, place, and time.  HENT:  Nose: No mucosal edema.  Mouth/Throat: No oropharyngeal exudate or posterior oropharyngeal edema.  Eyes: Conjunctivae, EOM and lids are normal. Pupils are equal, round, and reactive to light.  Neck: No JVD present. Carotid bruit is not present. No edema present. No thyroid mass and no thyromegaly present.  Cardiovascular: S1 normal and S2 normal. Exam reveals no gallop.  No murmur heard. Pulses:      Dorsalis pedis pulses are 2+ on the right side, and 2+ on the left side.  Respiratory: No respiratory distress. He has no wheezes. He has no rhonchi. He has no rales.  GI: Soft. Bowel sounds are normal. There is no tenderness.  Musculoskeletal:       Right knee: He exhibits no swelling.       Left ankle: He exhibits no swelling.  Lymphadenopathy:     He has no cervical adenopathy.  Neurological: He is alert and oriented to person, place, and time. No cranial nerve deficit.  Skin: Skin is warm. Nails show no clubbing.  Right groin looks like some chronic changes and some scarring.  Still with some drainage. Right scrotum has some induration and thickening in the skin.  Psychiatric: He has a normal mood and affect.      Data Reviewed: Basic Metabolic Panel: Recent Labs  Lab 12/04/17 0042 12/04/17 0517 12/08/17 0320  NA 139  --  142  K 4.1  --  5.3*  CL 100*  --  106  CO2 23  --  19*  GLUCOSE 200*  --  131*  BUN 44*  --  96*  CREATININE 6.95* 7.10* 11.51*  CALCIUM 9.3  --  9.1   Liver Function Tests: Recent Labs  Lab 12/04/17 0042  AST 11*  ALT 10*  ALKPHOS 87  BILITOT 0.5  PROT 8.8*  ALBUMIN 3.9    Recent Labs  Lab 12/04/17 0517  AMMONIA 29   CBC: Recent Labs  Lab 12/04/17 0042 12/04/17 0517 12/08/17 0320  WBC 16.8* 14.0* 5.6  NEUTROABS 15.4*  --   --   HGB 11.7* 10.1* 9.7*  HCT 37.4* 32.2* 30.1*  MCV 88.2 89.0 87.0  PLT 283 242 209   Cardiac Enzymes: Recent Labs  Lab 12/04/17 0042  TROPONINI 0.05*   BNP (last  3 results) Recent Labs    05/19/17 1522 12/04/17 0042  BNP 157.0* 177.0*    CBG: Recent Labs  Lab 12/07/17 1206 12/07/17 1801 12/07/17 2109 12/08/17 0804 12/08/17 1141  GLUCAP 216* 202* 148* 121* 139*    Recent Results (from the past 240 hour(s))  Blood Culture (routine x 2)     Status: None (Preliminary result)   Collection Time: 12/04/17 12:42 AM  Result Value Ref Range Status   Specimen Description BLOOD RIGHT FOREARM  Final   Special Requests   Final    BOTTLES DRAWN AEROBIC AND ANAEROBIC Blood Culture adequate volume   Culture   Final    NO GROWTH 4 DAYS Performed at [facility], [address] [place], [place] [address]    Report Status PENDING  Incomplete  Blood Culture (routine x 2)     Status: None (Preliminary result)   Collection Time:  12/04/17 12:42 AM  Result Value Ref Range Status   Specimen Description BLOOD RIGHT WRIST  Final   Special Requests   Final    BOTTLES DRAWN AEROBIC AND ANAEROBIC Blood Culture adequate volume   Culture   Final    NO GROWTH 4 DAYS Performed at [facility], [address], [place], [place] [address]    Report Status PENDING  Incomplete  Urine culture     Status: None   Collection Time: 12/04/17  2:40 AM  Result Value Ref Range Status   Specimen Description   Final    URINE, RANDOM Performed at [facility], [address], [place], [place] [address]    Special Requests   Final    NONE Performed at [facility], [address], [place], [place] [address]    Culture   Final    NO GROWTH Performed at [facility], [place] [address] [place], [place] [address]    Report Status 12/05/2017 FINAL  Final  Respiratory Panel by PCR     Status: None   Collection Time: 12/04/17  3:40 AM  Result Value Ref Range Status   Adenovirus NOT DETECTED NOT DETECTED Final   Coronavirus 229E NOT DETECTED NOT DETECTED Final   Coronavirus HKU1 NOT DETECTED NOT DETECTED Final   Coronavirus NL63 NOT DETECTED NOT DETECTED Final   Coronavirus OC43 NOT DETECTED NOT DETECTED Final   Metapneumovirus NOT DETECTED NOT DETECTED Final   Rhinovirus / Enterovirus NOT DETECTED NOT DETECTED Final   Influenza A NOT DETECTED NOT DETECTED Final   Influenza B NOT DETECTED NOT DETECTED Final   Parainfluenza Virus 1 NOT DETECTED NOT DETECTED Final   Parainfluenza Virus 2 NOT DETECTED NOT DETECTED Final   Parainfluenza Virus 3 NOT DETECTED NOT DETECTED Final   Parainfluenza Virus 4 NOT DETECTED NOT DETECTED Final   Respiratory Syncytial Virus NOT DETECTED NOT DETECTED Final   Bordetella pertussis NOT DETECTED NOT DETECTED Final   Chlamydophila pneumoniae NOT DETECTED NOT DETECTED Final   Mycoplasma pneumoniae NOT DETECTED NOT DETECTED Final    Comment: Performed at [place], [place] [address], [place], [place] [address]  MRSA PCR Screening     Status: None   Collection Time: 12/04/17  5:57 AM  Result Value Ref Range Status   MRSA by PCR NEGATIVE NEGATIVE Final    Comment:        The GeneXpert MRSA Assay (FDA approved for NASAL specimens only), is one component of a comprehensive MRSA colonization surveillance program. It is not intended to  diagnose MRSA infection nor to guide or monitor treatment for MRSA infections. Performed at [facility], [address] [place], [place] [address]   Aerobic/Anaerobic Culture (surgical/deep wound)     Status: None (Preliminary result)   Collection Time: 12/05/17 10:11 AM  Result Value Ref Range Status   Specimen Description   Final    GROIN RIGHT Performed at [facility], [place] [address], [place], [place] [address]    Special Requests   Final    Immunocompromised Performed at [facility], [place], [place] [address]    Gram Stain   Final    RARE WBC PRESENT, PREDOMINANTLY PMN MODERATE GRAM NEGATIVE RODS FEW GRAM POSITIVE COCCI IN PAIRS FEW GRAM POSITIVE RODS Performed at [facility], [place] [address] [place], [place] [address]    Culture   Final    NORMAL SKIN FLORA NO ANAEROBES ISOLATED; CULTURE IN PROGRESS FOR 5 DAYS    Report Status PENDING  Incomplete  Cath Tip Culture     Status: None (Preliminary result)   Collection Time: 12/06/17  5:41 PM  Result Value Ref Range Status   Specimen Description   Final    CATH TIP Performed at [facility], [address] [place] [address], [place], [place] [address]    Special Requests   Final    NONE Performed at [facility], [address] [place], [place] [address]    Culture   Final    NO GROWTH 2 DAYS Performed at [facility], [place] [address], [place], [place] [address]    Report Status PENDING  Incomplete     Studies: Ct Pelvis W Contrast  Result Date: 12/06/2017 CLINICAL DATA:  57 year old  male presenting with signs of sepsis. EXAM: CT PELVIS WITH CONTRAST TECHNIQUE: Multidetector CT imaging of the pelvis was performed using the standard protocol following the bolus administration of intravenous contrast. CONTRAST:  172m ISOVUE-300 IOPAMIDOL (ISOVUE-300) INJECTION 61% COMPARISON:  None. FINDINGS: Urinary Tract: Nondistended urinary bladder which may account for the slight circumferential thickened appearance of the bladder wall. Otherwise, consider cystitis. No distal uropathy is noted. No focal mural thickening of the bladder. Bowel:  No bowel obstruction or inflammation.  Normal appendix. Vascular/Lymphatic: Extensive aortoiliac and branch vessel atherosclerosis query diabetes. Small bilateral inguinal lymph nodes are identified, the largest on the right measuring 9 mm short axis and on the left 6 mm short axis. No operator, iliac chain or para-aortic lymphadenopathy. Reproductive:  Enlarged prostate measuring 4.9 x 6.1 x 4.5 cm. Other:  No free air nor free fluid. Musculoskeletal: Degenerative disc disease L4-5 with vacuum disc phenomenon. No acute nor suspicious osseous abnormality. Bone island noted of the right iliac bone adjacent to the SI joint. IMPRESSION: 1. No pathologically enlarged lymph nodes. Small subcentimeter short axis inguinal nodes are seen bilaterally. 2. Enlarged prostate consistent. 3. Extensive atherosclerotic vascular calcifications of the aortoiliac and bifemoral arteries as well as the branch vessels. Findings can be seen in diabetics. Electronically Signed   By: DAshley RoyaltyM.D.   On: 12/06/2017 20:51    Scheduled Meds: . amLODipine  5 mg Oral Daily  . aspirin EC  81 mg Oral Daily  . atorvastatin  80 mg Oral Daily  . calcitRIOL  0.5 mcg Oral Daily  . carvedilol  12.5 mg Oral BID  . clopidogrel  75 mg Oral Daily  . collagenase   Topical Daily  . docusate sodium  100 mg Oral BID  .  feeding supplement (NEPRO CARB STEADY)  237 mL Oral BID BM  . ferrous sulfate   325 mg Oral Daily  . furosemide  80 mg Oral BID  . heparin  5,000 Units Subcutaneous Q8H  . insulin aspart  0-15 Units Subcutaneous TID WC  . insulin aspart  0-5 Units Subcutaneous QHS  . insulin aspart  3 Units Subcutaneous TID WC  . levETIRAcetam  1,000 mg Oral QHS  . lisinopril  10 mg Oral Daily  . mirtazapine  7.5 mg Oral QHS  . multivitamin  1 tablet Oral QHS  . sevelamer carbonate  800 mg Oral TID WC  . sodium bicarbonate  650 mg Oral BID  . sodium chloride flush  3 mL Intravenous Q12H   Continuous Infusions: . sodium chloride    . DAPTOmycin (CUBICIN)  IV Stopped (12/08/17 1338)  . meropenem (MERREM) IV Stopped (12/07/17 1910)    Assessment/Plan:  1. Right groin / scrotal abscess and boil.  This could be the source of the patient's fever. Patient on daptomycin and meropenem.  Follow-up cultures- negative.  So far blood cultures are negative.  Appreciate surgical evaluation.  No surgery needed as per surgery. 2. Dialysis catheter dysfunction.        Catheter is removed by vascular and the tape is sent for culture, placing the new catheter tomorrow, cx are negative so far. 3.  Acute encephalopathy this has improved. 3. End-stage renal disease.  Dialysis as per nephrology 4. Essential hypertension continue usual medications 5. History of seizure on Keppra 6. Hyperlipidemia unspecified on atorvastatin 7. Type 2 diabetes mellitus on sliding scale and glargine insulin     As he had hypoglycemia in the early morning last 2 days, stopped his long-acting insulin.  Code Status:     Code Status Orders  (From admission, onward)        Start     Ordered   12/04/17 0511  Full code  Continuous     12/04/17 0511    Code Status History    Date Active Date Inactive Code Status Order ID Comments User Context   08/22/2017 12:44 08/22/2017 18:00 Full Code [id number]  Algernon Huxley, MD Inpatient   04/21/2017 18:19 04/25/2017 19:15 Full Code [id number]  Dustin Flock, MD Inpatient    11/06/2015 20:35 11/07/2015 18:18 Full Code [id number]  Aldean Jewett, MD ED     Family Communication: Spoke with wife on the phone . Disposition Plan: home once catheter replaced.  This can potentially be done on Thursday  Consultants:  Nephrology  General surgery  Antibiotics:  Meropenem  Cubicin  Time spent: 25 minutes  [employer]

## 2017-12-09 ENCOUNTER — Encounter
Admission: EM | Disposition: A | Discharge status: Home / Self Care | Payer: Self-pay | Source: Home / Self Care | Attending: Internal Medicine

## 2017-12-09 ENCOUNTER — Inpatient Hospital Stay
Admission: EM | Disposition: A | Discharge status: Home / Self Care | Payer: Self-pay | Source: Home / Self Care | Attending: Internal Medicine

## 2017-12-09 DIAGNOSIS — T82868A Thrombosis of vascular prosthetic devices, implants and grafts, initial encounter: Secondary | ICD-10-CM

## 2017-12-09 DIAGNOSIS — N186 End stage renal disease: Secondary | ICD-10-CM

## 2017-12-09 HISTORY — PX: DIALYSIS/PERMA CATHETER INSERTION: CATH118288

## 2017-12-09 HISTORY — PX: DIALYSIS/PERMA CATHETER REMOVAL: CATH118289

## 2017-12-09 LAB — CULTURE, BLOOD (ROUTINE X 2)
Culture: NO GROWTH
Culture: NO GROWTH
Special Requests: ADEQUATE
Special Requests: ADEQUATE

## 2017-12-09 LAB — GLUCOSE, CAPILLARY
Glucose-Capillary: 154 mg/dL — ABNORMAL HIGH (ref 65–99)
Glucose-Capillary: 99 mg/dL (ref 65–99)
Glucose-Capillary: 92 mg/dL (ref 65–99)
Glucose-Capillary: 95 mg/dL (ref 65–99)
Glucose-Capillary: 181 mg/dL — ABNORMAL HIGH (ref 65–99)

## 2017-12-09 SURGERY — DIALYSIS/PERMA CATHETER INSERTION
Anesthesia: Moderate Sedation

## 2017-12-09 SURGERY — DIALYSIS/PERMA CATHETER REMOVAL
Anesthesia: Moderate Sedation | Laterality: Left

## 2017-12-09 MED ORDER — CLINDAMYCIN PHOSPHATE 300 MG/50ML IV SOLN
INTRAVENOUS | Status: AC
  Filled 2017-12-09: qty 50

## 2017-12-09 MED ORDER — MIDAZOLAM HCL 5 MG/5ML IJ SOLN
INTRAMUSCULAR | Status: AC
Start: 2017-12-09 — End: 2017-12-09
  Filled 2017-12-09: qty 5

## 2017-12-09 MED ORDER — FENTANYL CITRATE (PF) 100 MCG/2ML IJ SOLN
INTRAMUSCULAR | Status: DC | PRN
  Administered 2017-12-09: 50 ug via INTRAVENOUS
  Administered 2017-12-09 (×2): 25 ug via INTRAVENOUS

## 2017-12-09 MED ORDER — MIDAZOLAM HCL 5 MG/5ML IJ SOLN
INTRAMUSCULAR | Status: AC
  Filled 2017-12-09: qty 5

## 2017-12-09 MED ORDER — HEPARIN (PORCINE) IN NACL 2-0.9 UNIT/ML-% IJ SOLN
INTRAMUSCULAR | Status: AC
  Filled 2017-12-09: qty 500

## 2017-12-09 MED ORDER — EPOETIN ALFA 10000 UNIT/ML IJ SOLN
4000.0000 [IU] | INTRAMUSCULAR | Status: DC
  Administered 2017-12-09 – 2017-12-12 (×2): 4000 [IU] via INTRAVENOUS

## 2017-12-09 MED ORDER — FENTANYL CITRATE (PF) 100 MCG/2ML IJ SOLN
INTRAMUSCULAR | Status: AC
  Filled 2017-12-09: qty 2

## 2017-12-09 MED ORDER — LIDOCAINE HCL (PF) 1 % IJ SOLN
INTRAMUSCULAR | Status: AC
  Filled 2017-12-09: qty 30

## 2017-12-09 MED ORDER — HEPARIN SODIUM (PORCINE) 10000 UNIT/ML IJ SOLN
INTRAMUSCULAR | Status: AC
  Filled 2017-12-09: qty 1

## 2017-12-09 MED ORDER — MIDAZOLAM HCL 2 MG/2ML IJ SOLN
INTRAMUSCULAR | Status: DC | PRN
  Administered 2017-12-09 (×3): 1 mg via INTRAVENOUS

## 2017-12-09 MED ORDER — DOXYCYCLINE HYCLATE 100 MG PO TABS
100.0000 mg | ORAL_TABLET | Freq: Two times a day (BID) | ORAL | Status: DC
  Administered 2017-12-10 – 2017-12-11 (×5): 100 mg via ORAL
  Filled 2017-12-09 (×9): qty 1

## 2017-12-09 MED ORDER — LIDOCAINE-EPINEPHRINE (PF) 1 %-1:200000 IJ SOLN
INTRAMUSCULAR | Status: AC
  Filled 2017-12-09: qty 30

## 2017-12-09 MED ORDER — CLINDAMYCIN PHOSPHATE 300 MG/50ML IV SOLN
300.0000 mg | INTRAVENOUS | Status: AC
  Administered 2017-12-09: 300 mg via INTRAVENOUS
  Filled 2017-12-09: qty 50

## 2017-12-09 MED ORDER — FERROUS SULFATE 325 (65 FE) MG PO TABS
325.0000 mg | ORAL_TABLET | ORAL | Status: DC
  Administered 2017-12-10 – 2017-12-11 (×2): 325 mg via ORAL
  Filled 2017-12-09 (×5): qty 1

## 2017-12-09 MED ORDER — LISINOPRIL 10 MG PO TABS
10.0000 mg | ORAL_TABLET | Freq: Every day | ORAL | Status: DC
  Administered 2017-12-10 – 2017-12-11 (×3): 10 mg via ORAL
  Filled 2017-12-09 (×3): qty 1

## 2017-12-09 MED ORDER — MIDAZOLAM HCL 2 MG/2ML IJ SOLN
INTRAMUSCULAR | Status: DC | PRN
  Administered 2017-12-09: 1 mg via INTRAVENOUS

## 2017-12-09 MED ORDER — NEPRO/CARBSTEADY PO LIQD
237.0000 mL | Freq: Two times a day (BID) | ORAL | Status: DC
  Administered 2017-12-11 (×2): 237 mL via ORAL

## 2017-12-09 MED ORDER — AMLODIPINE BESYLATE 5 MG PO TABS
5.0000 mg | ORAL_TABLET | Freq: Every day | ORAL | Status: DC
  Administered 2017-12-10 – 2017-12-11 (×3): 5 mg via ORAL
  Filled 2017-12-09 (×4): qty 1

## 2017-12-09 MED ORDER — SODIUM CHLORIDE 0.9 % IV SOLN
INTRAVENOUS | Status: DC
  Administered 2017-12-09: 16:00:00 via INTRAVENOUS

## 2017-12-09 MED ORDER — IOPAMIDOL (ISOVUE-300) INJECTION 61%
INTRAVENOUS | Status: DC | PRN
  Administered 2017-12-09: 5 mL via INTRA_ARTERIAL

## 2017-12-09 SURGICAL SUPPLY — 9 items
CATH BEACON 5 .035 40 KMP TP (CATHETERS) ×1 IMPLANT
CATH BEACON 5 .038 40 KMP TP (CATHETERS) ×1
CATH PALINDROME RT-P 15FX23CM (CATHETERS) ×2 IMPLANT
NEEDLE ENTRY 21GA 7CM ECHOTIP (NEEDLE) ×2 IMPLANT
SET INTRO CAPELLA COAXIAL (SET/KITS/TRAYS/PACK) ×2 IMPLANT
SUT MNCRL AB 4-0 PS2 18 (SUTURE) ×2 IMPLANT
SUT SILK 0 FSL (SUTURE) ×2 IMPLANT
TRAY SUT REMOVAL LITTAUER SCS (KITS) ×2 IMPLANT
WIRE J 3MM .035X145CM (WIRE) ×2 IMPLANT

## 2017-12-09 SURGICAL SUPPLY — 3 items
KIT DIALYSIS CATH TRI 30X13 (CATHETERS) ×2 IMPLANT
PACK ANGIOGRAPHY (CUSTOM PROCEDURE TRAY) ×2 IMPLANT
SHEATH BRITE TIP 5FRX11 (SHEATH) ×2 IMPLANT

## 2017-12-09 NOTE — Progress Notes (Signed)
[facility]  ROUNDING NOTE   Subjective:  Patient known to [place] from prior admissions.   He undergoes dialysis on Monday, Wednesday, Friday. Patient brought in by his wife for fever. His influenza screen was found to be negative. Source of fever is thought to be rt groin abscess  Permcath malfunctioned. It was removed 2/21 Denies any acute c/o Ready for permcath and asking about going home   Objective:  Vital signs in last 24 hours:  Temp:  [98.1 F (36.7 C)-98.7 F (37.1 C)] 98.1 F (36.7 C) (02/22 0629) Pulse Rate:  [63-71] 63 (02/22 0629) Resp:  [18] 18 (02/22 0629) BP: (158-163)/(72-76) 158/76 (02/22 0629) SpO2:  [97 %-99 %] 99 % (02/22 0629)  Weight change:  Filed Weights   12/04/17 0040 12/04/17 0519 12/06/17 0600  Weight: 80.7 kg (178 lb) 80.3 kg (177 lb) 79.5 kg (175 lb 4.3 oz)    Intake/Output: I/O last 3 completed shifts: In: 1000 [P.O.:900; IV Piggyback:100] Out: 300 [Urine:300]   Intake/Output this shift:  No intake/output data recorded.  Physical Exam: General: No acute distress  Head: Normocephalic, atraumatic. Moist oral mucosal membranes  Eyes: Anicteric  Neck: Supple, trachea midline  Lungs:  Clear to auscultation, normal effort  Heart: S1S2 no rubs  Abdomen:  Soft, nontender, bowel sounds present,   Extremities: R BKA, trace LE edema  Neurologic: Awake, alert, following commands  Skin: right groin abscess  Access: To be placed    Basic Metabolic Panel: Recent Labs  Lab 12/04/17 0042 12/04/17 0517 12/08/17 0320  NA 139  --  142  K 4.1  --  5.3*  CL 100*  --  106  CO2 23  --  19*  GLUCOSE 200*  --  131*  BUN 44*  --  96*  CREATININE 6.95* 7.10* 11.51*  CALCIUM 9.3  --  9.1    Liver Function Tests: Recent Labs  Lab 12/04/17 0042  AST 11*  ALT 10*  ALKPHOS 87  BILITOT 0.5  PROT 8.8*  ALBUMIN 3.9   No results for input(s): LIPASE, AMYLASE in the last 168 hours. Recent Labs  Lab 12/04/17 0517  AMMONIA 29     CBC: Recent Labs  Lab 12/04/17 0042 12/04/17 0517 12/08/17 0320  WBC 16.8* 14.0* 5.6  NEUTROABS 15.4*  --   --   HGB 11.7* 10.1* 9.7*  HCT 37.4* 32.2* 30.1*  MCV 88.2 89.0 87.0  PLT 283 242 209    Cardiac Enzymes: Recent Labs  Lab 12/04/17 0042  TROPONINI 0.05*    BNP: Invalid input(s): POCBNP  CBG: Recent Labs  Lab 12/08/17 0804 12/08/17 1141 12/08/17 1730 12/08/17 2230 12/09/17 0754  GLUCAP 121* 139* 181* 198* 154*    Microbiology: Results for orders placed or performed during the hospital encounter of 12/04/17  Blood Culture (routine x 2)     Status: None   Collection Time: 12/04/17 12:42 AM  Result Value Ref Range Status   Specimen Description BLOOD RIGHT FOREARM  Final   Special Requests   Final    BOTTLES DRAWN AEROBIC AND ANAEROBIC Blood Culture adequate volume   Culture   Final    NO GROWTH 5 DAYS Performed at [facility], [address] [place], [place] [address]    Report Status 12/09/2017 FINAL  Final  Blood Culture (routine x 2)     Status: None   Collection Time: 12/04/17 12:42 AM  Result Value Ref Range Status   Specimen Description BLOOD RIGHT WRIST  Final   Special Requests   Final    BOTTLES DRAWN AEROBIC AND ANAEROBIC Blood Culture adequate volume   Culture   Final    NO GROWTH 5 DAYS Performed at [facility], [address] [place], [place] [address]    Report Status 12/09/2017 FINAL  Final  Urine culture     Status: None   Collection Time: 12/04/17  2:40 AM  Result Value Ref Range Status   Specimen Description   Final    URINE, RANDOM Performed at [facility], [address] [place], [place] [address]    Special Requests   Final    NONE Performed at [facility], [address] [place], [place] [address]    Culture   Final    NO GROWTH Performed at [facility], [place] [address], [place], [place] [address]    Report Status 12/05/2017 FINAL  Final  Respiratory Panel  by PCR     Status: None   Collection Time: 12/04/17  3:40 AM  Result Value Ref Range Status   Adenovirus NOT DETECTED NOT DETECTED Final   Coronavirus 229E NOT DETECTED NOT DETECTED Final   Coronavirus HKU1 NOT DETECTED NOT DETECTED Final   Coronavirus NL63 NOT DETECTED NOT DETECTED Final   Coronavirus OC43 NOT DETECTED NOT DETECTED Final   Metapneumovirus NOT DETECTED NOT DETECTED Final   Rhinovirus / Enterovirus NOT DETECTED NOT DETECTED Final   Influenza A NOT DETECTED NOT DETECTED Final   Influenza B NOT DETECTED NOT DETECTED Final   Parainfluenza Virus 1 NOT DETECTED NOT DETECTED Final   Parainfluenza Virus 2 NOT DETECTED NOT DETECTED Final   Parainfluenza Virus 3 NOT DETECTED NOT DETECTED Final   Parainfluenza Virus 4 NOT DETECTED NOT DETECTED Final   Respiratory Syncytial Virus NOT DETECTED NOT DETECTED Final   Bordetella pertussis NOT DETECTED NOT DETECTED Final   Chlamydophila pneumoniae NOT DETECTED NOT DETECTED Final   Mycoplasma pneumoniae NOT DETECTED NOT DETECTED Final    Comment: Performed at [facility], [place] [address] [place], [place] [address]  MRSA PCR Screening     Status: None   Collection Time: 12/04/17  5:57 AM  Result Value Ref Range Status   MRSA by PCR NEGATIVE NEGATIVE Final    Comment:        The GeneXpert MRSA Assay (FDA approved for NASAL specimens only), is one component of a comprehensive MRSA colonization surveillance program. It is not intended to diagnose MRSA infection nor to guide or monitor treatment for MRSA infections. Performed at [facility], [address] [place], [place] [address]   Aerobic/Anaerobic Culture (surgical/deep wound)     Status: None (Preliminary result)   Collection Time: 12/05/17 10:11 AM  Result Value Ref Range Status   Specimen Description   Final    GROIN RIGHT Performed at [facility], [place] [address] [place], [place] [address]    Special Requests   Final     Immunocompromised Performed at [facility], [place], [place] [address]    Gram Stain   Final    RARE WBC PRESENT, PREDOMINANTLY PMN MODERATE GRAM NEGATIVE RODS FEW GRAM POSITIVE COCCI IN PAIRS FEW GRAM POSITIVE RODS Performed at [facility], [place] [address], [place], [place] [address]    Culture   Final    NORMAL SKIN FLORA NO ANAEROBES ISOLATED; CULTURE IN PROGRESS FOR 5 DAYS    Report Status PENDING  Incomplete  Cath Tip Culture     Status: None (Preliminary result)   Collection Time: 12/06/17  5:41 PM  Result Value Ref Range Status   Specimen Description   Final    CATH TIP Performed at [facility], [address], [place], [place] [address]    Special Requests   Final    NONE Performed at [facility], [address], [place], [place] [address]    Culture   Final    NO GROWTH 2 DAYS Performed at [facility], [place] [address] [place], [place] [address]    Report Status PENDING  Incomplete    Coagulation Studies: No results for input(s): LABPROT, INR in the last 72 hours.  Urinalysis: No results for input(s): COLORURINE, LABSPEC, PHURINE, GLUCOSEU, HGBUR, BILIRUBINUR, KETONESUR, PROTEINUR, UROBILINOGEN, NITRITE, LEUKOCYTESUR in the last 72 hours.  Invalid input(s): APPERANCEUR    Imaging: No results found.   Medications:   . sodium chloride    . DAPTOmycin (CUBICIN)  IV Stopped (12/08/17 1338)  . meropenem (MERREM) IV 500 mg (12/08/17 1745)   . amLODipine  5 mg Oral QHS  . aspirin EC  81 mg Oral Daily  . atorvastatin  80 mg Oral Daily  . calcitRIOL  0.5 mcg Oral Daily  . carvedilol  12.5 mg Oral BID  . clopidogrel  75 mg Oral Daily  . collagenase   Topical Daily  . docusate sodium  100 mg Oral BID  . feeding supplement (NEPRO CARB STEADY)  237 mL Oral BID BM  . ferrous sulfate  325 mg Oral Daily  . furosemide  80 mg Oral BID  . heparin  5,000 Units Subcutaneous Q8H  . insulin aspart  0-15  Units Subcutaneous TID WC  . insulin aspart  0-5 Units Subcutaneous QHS  . insulin aspart  3 Units Subcutaneous TID WC  . levETIRAcetam  1,000 mg Oral QHS  . lisinopril  10 mg Oral QHS  . mirtazapine  7.5 mg Oral QHS  . multivitamin  1 tablet Oral QHS  . sevelamer carbonate  800 mg Oral TID WC  . sodium bicarbonate  650 mg Oral BID  . sodium chloride flush  3 mL Intravenous Q12H   sodium chloride, acetaminophen **OR** acetaminophen, ondansetron **OR** ondansetron (ZOFRAN) IV, polyethylene glycol, sodium chloride flush, triamcinolone ointment  Assessment/ Plan:  57 y.o. male with past medical history of ESRD on HD MWF followed by [facility] nephrology, diabetes mellitus type 2, history of DVT, right below the knee amputation, hypertension, history of intracerebral hemorrhage, anemia of chronic kidney disease, secondary hyperparathyroidism, seizure disorder who presents now with fever.  [facility] nephrology/[address]/MWF.  1.  ESRD on HD MWF.  Patient could not get dialysis due to access malfunction  Tunneled dialysis cathter to be placed today Dialysis after cathter is placed  2.  Fever.  Influenza screen was found to be negative.  Chest x-ray negative for obvious pneumonia.   Potential source of right groin abscess.  Patient is currently getting IV antibiotics WBC count normalized  3.  Anemia of chronic kidney disease.  Hemoglobin currently 9.7.   We will resume low-dose EPO with hemodialysis  4.  Secondary hyperparathyroidism.  Monitor phosphorus.  Maintain the patient on Renvela 800 mg p.o. 3 times daily.  With meals   LOS: 5 Kealy Lewter 2/22/20199:07 AM

## 2017-12-09 NOTE — Progress Notes (Signed)
Pt with oozing from left chest site. Dr. Delana Meyer called and to bedside to assess. Surgical dressing removed. Site oozing slowly but consistently. Per MD to remove [facility] and insert Temp HD Cath in groin. Wife to bedside and consent obtained for new Temp Cath. Pt VSS denies pain.

## 2017-12-09 NOTE — Progress Notes (Signed)
Pre HD assessment  

## 2017-12-09 NOTE — Progress Notes (Signed)
Post HD assessment. Pt tolerated tx well without c/o or complications. Net UF 585.

## 2017-12-09 NOTE — Care Management (Signed)
Patient for perm cath placement today.  Patient will require home health resumption orders for Rn, PT, and OT at discharge.

## 2017-12-09 NOTE — Op Note (Signed)
[facility]   OPERATIVE NOTE     PROCEDURE: 1. Insertion of a left IJ tunneled dialysis catheter. 2. Catheter placement and cannulation under ultrasound and fluoroscopic guidance  PRE-OPERATIVE DIAGNOSIS: end-stage renal requiring hemodialysis  POST-OPERATIVE DIAGNOSIS: same as above  SURGEON: Hortencia Pilar  ANESTHESIA: Conscious sedation was administered under my direct supervision by the interventional radiology RN. IV Versed plus fentanyl were utilized. Continuous ECG, pulse oximetry and blood pressure was monitored throughout the entire procedure.  Conscious sedation was for a total of 32 minutes.  ESTIMATED BLOOD LOSS: Minimal  FINDING(S): 1.  Tips of the catheter in the right atrium on fluoroscopy 2.  No obvious pneumothorax on fluoroscopy  SPECIMEN(S):  none  INDICATIONS:   [name] is a 57 y.o. male  presents with end stage renal disease.  Therefore, the patient requires a tunneled dialysis catheter placement.  The patient is informed of  the risks catheter placement include but are not limited to: bleeding, infection, central venous injury, pneumothorax, possible venous stenosis, possible malpositioning in the venous system, and possible infections related to long-term catheter presence.  The patient was aware of these risks and agreed to proceed.  DESCRIPTION: The patient was taken back to Special Procedure suite.  Prior to sedation, the patient was given IV antibiotics.  After obtaining adequate sedation, the patient was prepped and draped in the standard fashion for a chest or neck tunneled dialysis catheter placement.  Appropriate Time Out is called.     The the left neck and chest wall are then infiltrated with 1% Lidocaine with epinepherine.  A 23 cm tip to cuff palindrome catheter is then selected, opened on the back table and prepped. Ultrasound is placed in a sterile sleeve.  Under ultrasound guidance, the left internal jugular vein  is examined and is noted to be echolucent and easily compressible indicating patency.   An image is recorded for the permanent record.  The left internal jugular vein is cannulated with the microneedle under direct ultrasound vissualization.  A Microwire followed by a micro sheath is then inserted without difficulty.   A J-wire was then advanced under fluoroscopic guidance into the inferior vena cava and the wire was secured.  Small counter incision was then made at the wire insertion site. A small pocket was fashioned with blunt dissection to allow easier passage of the cuff.  The dilator and peel-away sheath are then advanced over the wire under fluoroscopic guidance. The catheters and advanced through the peel-away sheath. It is approximated to the chest wall after verifying the tips at the atrial caval junction and an exit site is selected.  Small incision is made at the selected exit site and the tunneling device was passed subcutaneously to the neck counter incision. Catheter is then pulled through the subcutaneous tunnel. The catheter is then verified for tip position under fluoroscopy, transected and the hub assembly connected.    Each port was tested by aspirating and flushing.  No resistance was noted.  Each port was then thoroughly flushed with heparinized saline.  The catheter was secured in placed with two interrupted stitches of 0 silk tied to the catheter.  The neck incision was closed with a U-stitch of 4-0 Monocryl.  The neck and chest incision were cleaned and sterile bandages applied including a Biopatch.  Each port was then packed with concentrated heparin (1000 Units/mL) at the manufacturer recommended volumes to each port.  Sterile caps were applied to each port.  On  completion fluoroscopy, the tips of the catheter were in the right atrium, and there was no evidence of pneumothorax.  COMPLICATIONS: None  CONDITION: Unchanged   Hortencia Pilar [facility] Office:  [phone]   12/09/2017, 4:53 PM

## 2017-12-09 NOTE — Progress Notes (Signed)
Post HD assessment  

## 2017-12-09 NOTE — Plan of Care (Signed)
Pt off unit for perm cath placement and plans to go to dialysis after

## 2017-12-09 NOTE — Progress Notes (Signed)
Inpatient Diabetes Program Recommendations  AACE/ADA: New Consensus Statement on Inpatient Glycemic Control (2015)  Target Ranges:  Prepandial:   less than 140 mg/dL      Peak postprandial:   less than 180 mg/dL (1-2 hours)      Critically ill patients:  140 - 180 mg/dL   Lab Results  Component Value Date   GLUCAP 154 (H) 12/09/2017   HGBA1C 5.9 10/20/2017    Review of Glycemic Control  Results for Brady, [name] OJEDA (MRN [id number]) as of 12/09/2017 10:35  Ref. Range 12/08/2017 08:04 12/08/2017 11:41 12/08/2017 17:30 12/08/2017 22:30 12/09/2017 07:54  Glucose-Capillary Latest Ref Range: 65 - 99 mg/dL 121 (H) 139 (H) 181 (H) 198 (H) 154 (H)   Diabetes history:Type 2 with ESRD  OutpatientDMmeds:    **DM CoordinatorSpoke to patient at the bedsideregarding Lantus and Novolog insulin - he told me heis taking Lantus 0-17 units qhs and Novolog 0-12 units tid  Current Orders:  Novolog Moderate Correction Scale/ SSI (0-15 units) TID AC, Novolog 0-5 units qhs,  Novolog 3 units TID with meals     Consider starting Lantus 5 units qhs and decrease Novolog correction to sensitive correction. D/C hs Novolog 0-5 units. (ESRD- large doses of Novolog may "stack" and cause hypoglycemia  Gentry Fitz, RN, [place], [place], CDE Diabetes Coordinator Inpatient Diabetes Program  [phone] (Team Pager) [phone] ([place]) 12/09/2017 10:35 AM

## 2017-12-09 NOTE — Progress Notes (Signed)
Patient ID: [name], male   DOB: 04-Aug-1961, 57 y.o.   MRN: [id number]  [employer] [facility] PROGRESS NOTE  [name] CBS:[id number] DOB: [birth year] DOA: 12/04/2017 PCP: [name], MD  HPI/Subjective:  No fever or WBC count. Right groin / scrotum swelling improved.  Objective: Vitals:   12/09/17 2030 12/09/17 2045  BP: (!) 161/90 (!) 176/91  Pulse: 71 75  Resp: 11 12  Temp:    SpO2: 100% 100%    Filed Weights   12/06/17 0600 12/09/17 1533 12/09/17 1913  Weight: 79.5 kg (175 lb 4.3 oz) 79.4 kg (175 lb) 84.4 kg (186 lb 1.1 oz)    ROS: Review of Systems  Constitutional: Negative for chills, fever and malaise/fatigue.  HENT: Negative for congestion, ear discharge, hearing loss, nosebleeds and sore throat.   Eyes: Negative for blurred vision and double vision.  Respiratory: Negative for cough and shortness of breath.   Cardiovascular: Negative for chest pain, palpitations and leg swelling.  Gastrointestinal: Negative for abdominal pain, constipation, diarrhea, nausea and vomiting.  Genitourinary: Negative for dysuria and urgency.  Musculoskeletal: Negative for joint pain and myalgias.  Skin: Negative for rash.  Neurological: Negative for dizziness, tremors, focal weakness and headaches.   Exam: Physical Exam  Constitutional: He is oriented to person, place, and time.  HENT:  Nose: No mucosal edema.  Mouth/Throat: No oropharyngeal exudate or posterior oropharyngeal edema.  Eyes: Conjunctivae, EOM and lids are normal. Pupils are equal, round, and reactive to light.  Neck: No JVD present. Carotid bruit is not present. No edema present. No thyroid mass and no thyromegaly present.  Cardiovascular: S1 normal and S2 normal. Exam reveals no gallop.  No murmur heard. Pulses:      Dorsalis pedis pulses are 2+ on the left side.  Respiratory: No respiratory distress. He has no wheezes. He has no rhonchi. He has no rales.  GI: Soft. Bowel sounds are normal. There is no  tenderness.  Musculoskeletal:       Right knee: He exhibits no swelling.       Left ankle: He exhibits no swelling.  Right side BKA present.  Lymphadenopathy:    He has no cervical adenopathy.  Neurological: He is alert and oriented to person, place, and time. No cranial nerve deficit.  Skin: Skin is warm. Nails show no clubbing.  Right groin looks like some chronic changes and some scarring.  no drainage. Right scrotum has some induration and thickening in the skin.  Psychiatric: He has a normal mood and affect.      Data Reviewed: Basic Metabolic Panel: Recent Labs  Lab 12/04/17 0042 12/04/17 0517 12/08/17 0320  NA 139  --  142  K 4.1  --  5.3*  CL 100*  --  106  CO2 23  --  19*  GLUCOSE 200*  --  131*  BUN 44*  --  96*  CREATININE 6.95* 7.10* 11.51*  CALCIUM 9.3  --  9.1   Liver Function Tests: Recent Labs  Lab 12/04/17 0042  AST 11*  ALT 10*  ALKPHOS 87  BILITOT 0.5  PROT 8.8*  ALBUMIN 3.9    Recent Labs  Lab 12/04/17 0517  AMMONIA 29   CBC: Recent Labs  Lab 12/04/17 0042 12/04/17 0517 12/08/17 0320  WBC 16.8* 14.0* 5.6  NEUTROABS 15.4*  --   --   HGB 11.7* 10.1* 9.7*  HCT 37.4* 32.2* 30.1*  MCV 88.2 89.0 87.0  PLT 283 242 209   Cardiac  Enzymes: Recent Labs  Lab 12/04/17 0042  TROPONINI 0.05*   BNP (last 3 results) Recent Labs    05/19/17 1522 12/04/17 0042  BNP 157.0* 177.0*    CBG: Recent Labs  Lab 12/08/17 2230 12/09/17 0754 12/09/17 1139 12/09/17 1734 12/09/17 1851  GLUCAP 198* 154* 99 92 95    Recent Results (from the past 240 hour(s))  Blood Culture (routine x 2)     Status: None   Collection Time: 12/04/17 12:42 AM  Result Value Ref Range Status   Specimen Description BLOOD RIGHT FOREARM  Final   Special Requests   Final    BOTTLES DRAWN AEROBIC AND ANAEROBIC Blood Culture adequate volume   Culture   Final    NO GROWTH 5 DAYS Performed at [facility], [address], [place], [place] [address]     Report Status 12/09/2017 FINAL  Final  Blood Culture (routine x 2)     Status: None   Collection Time: 12/04/17 12:42 AM  Result Value Ref Range Status   Specimen Description BLOOD RIGHT WRIST  Final   Special Requests   Final    BOTTLES DRAWN AEROBIC AND ANAEROBIC Blood Culture adequate volume   Culture   Final    NO GROWTH 5 DAYS Performed at [facility], [address] [place], [place] [address]    Report Status 12/09/2017 FINAL  Final  Urine culture     Status: None   Collection Time: 12/04/17  2:40 AM  Result Value Ref Range Status   Specimen Description   Final    URINE, RANDOM Performed at [facility], [address] [place], [place] [address]    Special Requests   Final    NONE Performed at [facility], [address] [place], [place] [address]    Culture   Final    NO GROWTH Performed at [facility], [place] [address] [place], [place] [address]    Report Status 12/05/2017 FINAL  Final  Respiratory Panel by PCR     Status: None   Collection Time: 12/04/17  3:40 AM  Result Value Ref Range Status   Adenovirus NOT DETECTED NOT DETECTED Final   Coronavirus 229E NOT DETECTED NOT DETECTED Final   Coronavirus HKU1 NOT DETECTED NOT DETECTED Final   Coronavirus NL63 NOT DETECTED NOT DETECTED Final   Coronavirus OC43 NOT DETECTED NOT DETECTED Final   Metapneumovirus NOT DETECTED NOT DETECTED Final   Rhinovirus / Enterovirus NOT DETECTED NOT DETECTED Final   Influenza A NOT DETECTED NOT DETECTED Final   Influenza B NOT DETECTED NOT DETECTED Final   Parainfluenza Virus 1 NOT DETECTED NOT DETECTED Final   Parainfluenza Virus 2 NOT DETECTED NOT DETECTED Final   Parainfluenza Virus 3 NOT DETECTED NOT DETECTED Final   Parainfluenza Virus 4 NOT DETECTED NOT DETECTED Final   Respiratory Syncytial Virus NOT DETECTED NOT DETECTED Final   Bordetella pertussis NOT DETECTED NOT DETECTED Final   Chlamydophila pneumoniae NOT DETECTED NOT DETECTED  Final   Mycoplasma pneumoniae NOT DETECTED NOT DETECTED Final    Comment: Performed at [facility], [place] [address] [place], [place] [address]  MRSA PCR Screening     Status: None   Collection Time: 12/04/17  5:57 AM  Result Value Ref Range Status   MRSA by PCR NEGATIVE NEGATIVE Final    Comment:        The GeneXpert MRSA Assay (FDA approved for NASAL specimens only), is one component  of a comprehensive MRSA colonization surveillance program. It is not intended to diagnose MRSA infection nor to guide or monitor treatment for MRSA infections. Performed at [facility], [address] [place], [place] [address]   Aerobic/Anaerobic Culture (surgical/deep wound)     Status: None (Preliminary result)   Collection Time: 12/05/17 10:11 AM  Result Value Ref Range Status   Specimen Description   Final    GROIN RIGHT Performed at [facility], [place] [address] [place], [place] [address]    Special Requests   Final    Immunocompromised Performed at [facility], [place], [place] [address]    Gram Stain   Final    RARE WBC PRESENT, PREDOMINANTLY PMN MODERATE GRAM NEGATIVE RODS FEW GRAM POSITIVE COCCI IN PAIRS FEW GRAM POSITIVE RODS Performed at [facility], [place] [address], [place], [place] [address]    Culture   Final    NORMAL SKIN FLORA NO ANAEROBES ISOLATED; CULTURE IN PROGRESS FOR 5 DAYS    Report Status PENDING  Incomplete  Cath Tip Culture     Status: None (Preliminary result)   Collection Time: 12/06/17  5:41 PM  Result Value Ref Range Status   Specimen Description   Final    CATH TIP Performed at [facility], [address] [place], [place] [address]    Special Requests   Final    NONE Performed at [facility], [address] [place], [place] [address]    Culture   Final    NO GROWTH 3 DAYS Performed at [facility], [place] [address], [place], [place] [address]    Report Status  PENDING  Incomplete     Studies: No results found.  Scheduled Meds: . amLODipine  5 mg Oral QHS  . atorvastatin  80 mg Oral Daily  . calcitRIOL  0.5 mcg Oral Daily  . carvedilol  12.5 mg Oral BID  . collagenase   Topical Daily  . docusate sodium  100 mg Oral BID  . doxycycline  100 mg Oral Q12H  . epoetin (EPOGEN/PROCRIT) injection  4,000 Units Intravenous Q M,W,F-HD  . feeding supplement (NEPRO CARB STEADY)  237 mL Oral BID BM  . [START ON 12/10/2017] feeding supplement (NEPRO CARB STEADY)  237 mL Oral BID BM  . [START ON 12/10/2017] ferrous sulfate  325 mg Oral Q24H  . furosemide  80 mg Oral BID  . heparin  5,000 Units Subcutaneous Q8H  . insulin aspart  0-15 Units Subcutaneous TID WC  . insulin aspart  3 Units Subcutaneous TID WC  . levETIRAcetam  1,000 mg Oral QHS  . lisinopril  10 mg Oral QHS  . mirtazapine  7.5 mg Oral QHS  . multivitamin  1 tablet Oral QHS  . sevelamer carbonate  800 mg Oral TID WC  . sodium bicarbonate  650 mg Oral BID  . sodium chloride flush  3 mL Intravenous Q12H   Continuous Infusions: . sodium chloride    . sodium chloride 20 mL/hr at 12/09/17 1542  . clindamycin      Assessment/Plan:  1. Right groin / scrotal abscess and boil.  This could be the source of the patient's fever. Patient on daptomycin and meropenem.  Follow-up cultures- negative.  So far blood cultures are negative.  Appreciate surgical evaluation.  No intervention needed as per surgery.      We may give total 10 ays of Abx course,, then stop, if  improved. 2. Dialysis catheter dysfunction.        Catheter is removed by vascular and the tip is sent for culture, negative.    Placed a new tunneled catheter ( 12/09/17)- had bleeding from the site, so removed and later a temp catheter placed as pt had no HD for 5 days.( 12/09/17 evening) 3.  Acute encephalopathy this has improved. 3. End-stage renal disease.  Dialysis as per nephrology 4. Essential hypertension continue usual  medications 5. History of seizure on Keppra 6. Hyperlipidemia unspecified on atorvastatin 7. Type 2 diabetes mellitus on sliding scale and glargine insulin     As he had hypoglycemia in the early morning, stopped his long-acting insulin. Stable now.  Code Status:     Code Status Orders  (From admission, onward)        Start     Ordered   12/04/17 0511  Full code  Continuous     12/04/17 0511    Code Status History    Date Active Date Inactive Code Status Order ID Comments User Context   08/22/2017 12:44 08/22/2017 18:00 Full Code [id number]  Algernon Huxley, MD Inpatient   04/21/2017 18:19 04/25/2017 19:15 Full Code [id number]  Dustin Flock, MD Inpatient   11/06/2015 20:35 11/07/2015 18:18 Full Code [id number]  Aldean Jewett, MD ED     Family Communication: Spoke with wife on the phone . Disposition Plan: home once catheter replaced.  This can potentially be done on Thursday  Consultants:  Nephrology  General surgery  Antibiotics:  Meropenem  Cubicin  Time spent: 35 minutes  [employer]

## 2017-12-09 NOTE — Progress Notes (Signed)
HD tx start 

## 2017-12-09 NOTE — Op Note (Signed)
  OPERATIVE NOTE   PROCEDURE: 1. Insertion of temporary dialysis catheter catheter right femoral approach. 2. Removal of left IJ tunneled dialysis catheter  PRE-OPERATIVE DIAGNOSIS: Complication of tunneled dialysis catheter with bleeding; end-stage renal disease  POST-OPERATIVE DIAGNOSIS: Same  SURGEON: Katha Cabal M.D.  ANESTHESIA: 1% lidocaine local infiltration  ESTIMATED BLOOD LOSS: Minimal cc  INDICATIONS:   [name] is a 57 y.o. male who presents with bleeding from his tunneled catheter.  This is in spite of extra pursestring sutures and injection with lidocaine mixed with epinephrine.  Therefore, this catheter will require removal to allow for hemostasis.  Temporary catheter will be placed as the patient has not had dialysis in 5 days.  The risks and benefits were reviewed with the patient all questions answered all are in agreement with proceeding.  DESCRIPTION: After obtaining full informed written consent, the patient was positioned supine. The right groin was prepped and draped in a sterile fashion. Ultrasound was placed in a sterile sleeve. Ultrasound was utilized to identify the right common femoral vein which is noted to be echolucent and compressible indicating patency. Images recorded for the permanent record. Under real-time visualization a Seldinger needle is inserted into the vein and the guidewires advanced however initially the wire would not pass beyond 20 cm.  A 5 French sheath was then inserted over the wire and a hand-injection of contrast used to demonstrate the venous anatomy.  No strictures were identified and under fluoroscopic guidance the wire was advanced into the IVC.  Small counterincision was made at the wire insertion site. Dilator is passed over the wire and the temporary dialysis catheter catheter is fed over the wire without difficulty.  All lumens aspirate and flush easily and are packed with heparin saline. Catheter secured to the skin  of the right thigh with 2-0 silk. A sterile dressing is applied with Biopatch.  Attention is then turned to the left neck and chest wall.  The left IJ catheter and surrounding area is prepped and draped in a sterile fashion. The cuff was localized by palpation and noted to be less than 3 cm from the exit site. After appropriate timeout is called, 1% lidocaine with epinephrine is infiltrated into the surrounding tissues around the cuff. Small transverse incision is created at the exit site with an 11 blade scalpel and the dissection was carried up along the catheter to expose the cuff of the tunneled catheter.  The catheter cuff is then freed from the surrounding attachments and adhesions. Once the catheter has been freed circumferentially it is removed in 1 piece. Light pressure was held at the base of the neck.   Pursestring suture of 4-0 Monocryl was placed and a sterile dressing is applied to the exit site. Patient tolerated procedure well and there were no complications.   COMPLICATIONS: None  CONDITION: Unchanged  Hortencia Pilar Office:  [phone] 12/09/2017, 6:31 PM

## 2017-12-09 NOTE — Progress Notes (Signed)
HD tx end  

## 2017-12-10 LAB — CATH TIP CULTURE: Culture: NO GROWTH

## 2017-12-10 LAB — HEPATITIS B SURFACE ANTIGEN: Hepatitis B Surface Ag: NEGATIVE

## 2017-12-10 LAB — GLUCOSE, CAPILLARY
Glucose-Capillary: 165 mg/dL — ABNORMAL HIGH (ref 65–99)
Glucose-Capillary: 171 mg/dL — ABNORMAL HIGH (ref 65–99)
Glucose-Capillary: 90 mg/dL (ref 65–99)
Glucose-Capillary: 124 mg/dL — ABNORMAL HIGH (ref 65–99)
Glucose-Capillary: 129 mg/dL — ABNORMAL HIGH (ref 65–99)

## 2017-12-10 LAB — AEROBIC/ANAEROBIC CULTURE (SURGICAL/DEEP WOUND)

## 2017-12-10 LAB — HEPATITIS B SURFACE ANTIBODY, QUANTITATIVE: Hepatitis B-Post: 59.6 m[IU]/mL (ref >9.9)

## 2017-12-10 LAB — AEROBIC/ANAEROBIC CULTURE W GRAM STAIN (SURGICAL/DEEP WOUND)

## 2017-12-10 LAB — CK: Total CK: 53 U/L (ref 49–397)

## 2017-12-10 MED ORDER — HALOPERIDOL LACTATE 5 MG/ML IJ SOLN
2.0000 mg | Freq: Once | INTRAMUSCULAR | Status: AC
Start: 2017-12-11 — End: 2017-12-11
  Administered 2017-12-11: 2 mg via INTRAVENOUS
  Filled 2017-12-10: qty 0.4

## 2017-12-10 MED ORDER — SODIUM CHLORIDE 0.9 % IV SOLN
480.0000 mg | INTRAVENOUS | Status: DC
  Administered 2017-12-10: 480 mg via INTRAVENOUS
  Filled 2017-12-10 (×2): qty 9.6

## 2017-12-10 MED ORDER — HYDRALAZINE HCL 20 MG/ML IJ SOLN: 10.0000 mg | Freq: Four times a day (QID) | INTRAMUSCULAR | Status: DC | PRN

## 2017-12-10 NOTE — Progress Notes (Signed)
HD tx end  

## 2017-12-10 NOTE — Progress Notes (Signed)
Pre HD assessment  

## 2017-12-10 NOTE — Progress Notes (Signed)
Post HD assessment. Pt tolerated tx well, without c/o or complications. Net UF 517, goal met.

## 2017-12-10 NOTE — Progress Notes (Signed)
[facility] [place] NAME: [name]    MR#:  [id number]  DATE OF BIRTH:  [birth year]  SUBJECTIVE:  CHIEF COMPLAINT:   Chief Complaint  Patient presents with  . Fever   - Has a right groin temporary dialysis catheter. Some bleeding noted around it.   -still has low-grade fevers  REVIEW OF SYSTEMS:  Review of Systems  Constitutional: Positive for fever and malaise/fatigue. Negative for chills.  HENT: Negative for congestion, ear discharge, hearing loss and nosebleeds.   Eyes: Negative for blurred vision and double vision.  Respiratory: Negative for cough, shortness of breath and wheezing.   Cardiovascular: Negative for chest pain and palpitations.  Gastrointestinal: Negative for abdominal pain, constipation, diarrhea, nausea and vomiting.  Genitourinary: Negative for dysuria.  Musculoskeletal: Positive for myalgias.  Neurological: Negative for dizziness, speech change, focal weakness, seizures and headaches.    DRUG ALLERGIES:   Allergies  Allergen Reactions  . Vancomycin Shortness Of Breath    Eyes watering, SOB, wheezing  . Cefepime Other (See Comments)  . Methotrexate Other (See Comments)    Blood count drops  . Tape     VITALS:  Blood pressure (!) 163/91, pulse 64, temperature 99 F (37.2 C), temperature source Oral, resp. rate 15, height 5' 11"  (1.803 m), weight 84.7 kg (186 lb 11.7 oz), SpO2 100 %.  PHYSICAL EXAMINATION:  Physical Exam  GENERAL:  57 y.o.-year-old patient lying in the bed with no acute distress.  EYES: Pupils equal, round, reactive to light and accommodation. No scleral icterus. Extraocular muscles intact.  HEENT: Head atraumatic, normocephalic. Oropharynx and nasopharynx clear.  NECK:  Supple, no jugular venous distention. No thyroid enlargement, no tenderness.  LUNGS: Normal breath sounds bilaterally, no wheezing, rales,rhonchi or crepitation. No use of accessory muscles of respiration.  Decreased bibasilar breath sounds noted CARDIOVASCULAR: S1, S2 normal. No murmurs, rubs, or gallops.  ABDOMEN: Soft, nontender, nondistended. Bowel sounds present. No organomegaly or mass.  EXTREMITIES: No pedal edema, cyanosis, or clubbing. Right groin dialysis catheter noted. NEUROLOGIC: Cranial nerves II through XII are intact. Muscle strength 5/5 in all extremities. Sensation intact. Gait not checked. Global weakness PSYCHIATRIC: The patient is alert and oriented x 3.  SKIN: No obvious rash, lesion, or ulcer.    LABORATORY PANEL:   CBC Recent Labs  Lab 12/08/17 0320  WBC 5.6  HGB 9.7*  HCT 30.1*  PLT 209   ------------------------------------------------------------------------------------------------------------------  Chemistries  Recent Labs  Lab 12/04/17 0042  12/08/17 0320  NA 139  --  142  K 4.1  --  5.3*  CL 100*  --  106  CO2 23  --  19*  GLUCOSE 200*  --  131*  BUN 44*  --  96*  CREATININE 6.95*   < > 11.51*  CALCIUM 9.3  --  9.1  AST 11*  --   --   ALT 10*  --   --   ALKPHOS 87  --   --   BILITOT 0.5  --   --    < > = values in this interval not displayed.   ------------------------------------------------------------------------------------------------------------------  Cardiac Enzymes Recent Labs  Lab 12/04/17 0042  TROPONINI 0.05*   ------------------------------------------------------------------------------------------------------------------  RADIOLOGY:  No results found.  EKG:   Orders placed or performed during the hospital encounter of 12/04/17  . ED EKG 12-Lead  . ED EKG 12-Lead  . EKG 12-Lead  . EKG 12-Lead    ASSESSMENT AND PLAN:  57 year old African-American male with past medical history significant for end-stage renal disease on hemodialysis, history of DVT, history of intracranial hemorrhage, anemia of chronic disease, diabetes mellitus brought to the hospital secondary to sepsis with fever, leukocytosis and an  encephalopathy  - Sepsis-secondary to Right groin abscess-currently on daptomycin, doxycycline. Blood cultures are negative so far. Appreciate surgical evaluation, no intervention necessary. -Currently also has a right groin catheter. Monitor carefully  -End-stage renal disease-on Monday, Wednesday and Friday dialysis. -Appreciate nephrology consult. For dialysis again today -We'll need permacath placement, likely Monday or Tuesday. For now has a right groin temporary dialysis catheter.  -Hypertension-continue home medications. On lisinopril, Norvasc and Coreg.  -Anemia of chronic disease-on iron supplements.  -Seizure disorder-continue Keppra  -DVT prophylaxis-on subcutaneous heparin, will discontinue due to bleeding   All the records are reviewed and case discussed with Care Management/Social Workerr. Management plans discussed with the patient, family and they are in agreement.  CODE STATUS: Full Code  TOTAL TIME TAKING CARE OF THIS PATIENT: 38  minutes.   POSSIBLE D/C IN 2-3 DAYS, DEPENDING ON CLINICAL CONDITION.   Gladstone Lighter M.D on 12/10/2017 at 3:16 PM  Between 7am to 6pm - Pager - [phone]  After 6pm go to [email or web address] - password [email or web address] [facility]  Office  [phone]  CC: Primary care physician; [name], MD

## 2017-12-10 NOTE — Progress Notes (Signed)
Notified by dialysis nurse that patient has low-grade temperature We will restart Cubicin IV

## 2017-12-10 NOTE — Progress Notes (Signed)
Post HD assessment  

## 2017-12-10 NOTE — Progress Notes (Signed)
Pre HD assessment. Pt's temp. slightly elevated prior to coming down for HD. Temp. prior to HD tx 99.0, MD ordered antibiotics to be given during HD.

## 2017-12-10 NOTE — Progress Notes (Signed)
HD tx start 

## 2017-12-10 NOTE — Progress Notes (Signed)
[facility]  ROUNDING NOTE   Subjective:  Patient known to [place] from prior admissions.   He undergoes dialysis on Monday, Wednesday, Friday. Patient brought in by his wife for fever. His influenza screen was found to be negative. Source of fever is thought to be rt groin abscess Patient had malfunction of his dialysis cath which was removed And a new catheter was placed on the left IJ on February 21.  Unfortunately, patient had a complication of excessive bleeding from exit site therefore the catheter was removed and compression dressing was placed.  A temporary dialysis catheter was placed in the right groin  Patient underwent dialysis on Friday Currently he is being seen on dialysis and is tolerating well He is able to eat and drink without any nausea    HEMODIALYSIS FLOWSHEET:  Blood Flow Rate (mL/min): 300 mL/min Arterial Pressure (mmHg): -140 mmHg Venous Pressure (mmHg): 120 mmHg Transmembrane Pressure (mmHg): 50 mmHg Ultrafiltration Rate (mL/min): 340 mL/min Dialysate Flow Rate (mL/min): 600 ml/min Conductivity: Machine : 14 Conductivity: Machine : 14 Dialysis Fluid Bolus: Normal Saline Bolus Amount (mL): 250 mL    Objective:  Vital signs in last 24 hours:  Temp:  [97.9 F (36.6 C)-100.3 F (37.9 C)] 100.3 F (37.9 C) (02/23 1238) Pulse Rate:  [63-78] 75 (02/23 1238) Resp:  [8-20] 20 (02/23 1238) BP: (143-201)/(68-96) 155/81 (02/23 1238) SpO2:  [92 %-100 %] 99 % (02/23 1238) Weight:  [79.4 kg (175 lb)-84.4 kg (186 lb 1.1 oz)] 83.5 kg (184 lb 1.4 oz) (02/22 2240)  Weight change:  Filed Weights   12/09/17 1533 12/09/17 1913 12/09/17 2240  Weight: 79.4 kg (175 lb) 84.4 kg (186 lb 1.1 oz) 83.5 kg (184 lb 1.4 oz)    Intake/Output: I/O last 3 completed shifts: In: 220 [P.O.:120; IV Piggyback:100] Out: 785 [Urine:200; Other:585]   Intake/Output this shift:  No intake/output data recorded.  Physical Exam: General: No acute distress  Head:  Normocephalic, atraumatic. Moist oral mucosal membranes  Eyes: Anicteric  Neck: Supple, trachea midline  Lungs:  Clear to auscultation, normal effort  Heart: S1S2 no rubs  Abdomen:  Soft, nontender, bowel sounds present,   Extremities: R BKA, trace LE edema  Neurologic: Awake, alert, following commands  Skin: right groin abscess  Access:  Right femoral Cath    Basic Metabolic Panel: Recent Labs  Lab 12/04/17 0042 12/04/17 0517 12/08/17 0320  NA 139  --  142  K 4.1  --  5.3*  CL 100*  --  106  CO2 23  --  19*  GLUCOSE 200*  --  131*  BUN 44*  --  96*  CREATININE 6.95* 7.10* 11.51*  CALCIUM 9.3  --  9.1    Liver Function Tests: Recent Labs  Lab 12/04/17 0042  AST 11*  ALT 10*  ALKPHOS 87  BILITOT 0.5  PROT 8.8*  ALBUMIN 3.9   No results for input(s): LIPASE, AMYLASE in the last 168 hours. Recent Labs  Lab 12/04/17 0517  AMMONIA 29    CBC: Recent Labs  Lab 12/04/17 0042 12/04/17 0517 12/08/17 0320  WBC 16.8* 14.0* 5.6  NEUTROABS 15.4*  --   --   HGB 11.7* 10.1* 9.7*  HCT 37.4* 32.2* 30.1*  MCV 88.2 89.0 87.0  PLT 283 242 209    Cardiac Enzymes: Recent Labs  Lab 12/04/17 0042  TROPONINI 0.05*    BNP: Invalid input(s): POCBNP  CBG: Recent Labs  Lab 12/09/17 1734 12/09/17 1851 12/09/17 2301 12/10/17 0736 12/10/17  [place]*    Microbiology: Results for orders placed or performed during the hospital encounter of 12/04/17  Blood Culture (routine x 2)     Status: None   Collection Time: 12/04/17 12:42 AM  Result Value Ref Range Status   Specimen Description BLOOD RIGHT FOREARM  Final   Special Requests   Final    BOTTLES DRAWN AEROBIC AND ANAEROBIC Blood Culture adequate volume   Culture   Final    NO GROWTH 5 DAYS Performed at [facility], [address], [place], [place] [address]    Report Status 12/09/2017 FINAL  Final  Blood Culture (routine x 2)     Status: None   Collection Time: 12/04/17  12:42 AM  Result Value Ref Range Status   Specimen Description BLOOD RIGHT WRIST  Final   Special Requests   Final    BOTTLES DRAWN AEROBIC AND ANAEROBIC Blood Culture adequate volume   Culture   Final    NO GROWTH 5 DAYS Performed at [facility], [address] [place], [place] [address]    Report Status 12/09/2017 FINAL  Final  Urine culture     Status: None   Collection Time: 12/04/17  2:40 AM  Result Value Ref Range Status   Specimen Description   Final    URINE, RANDOM Performed at [facility], [address], [place], [place] [address]    Special Requests   Final    NONE Performed at [facility], [address], [place], [place] [address]    Culture   Final    NO GROWTH Performed at [facility], [place] [address] [place], [place] [address]    Report Status 12/05/2017 FINAL  Final  Respiratory Panel by PCR     Status: None   Collection Time: 12/04/17  3:40 AM  Result Value Ref Range Status   Adenovirus NOT DETECTED NOT DETECTED Final   Coronavirus 229E NOT DETECTED NOT DETECTED Final   Coronavirus HKU1 NOT DETECTED NOT DETECTED Final   Coronavirus NL63 NOT DETECTED NOT DETECTED Final   Coronavirus OC43 NOT DETECTED NOT DETECTED Final   Metapneumovirus NOT DETECTED NOT DETECTED Final   Rhinovirus / Enterovirus NOT DETECTED NOT DETECTED Final   Influenza A NOT DETECTED NOT DETECTED Final   Influenza B NOT DETECTED NOT DETECTED Final   Parainfluenza Virus 1 NOT DETECTED NOT DETECTED Final   Parainfluenza Virus 2 NOT DETECTED NOT DETECTED Final   Parainfluenza Virus 3 NOT DETECTED NOT DETECTED Final   Parainfluenza Virus 4 NOT DETECTED NOT DETECTED Final   Respiratory Syncytial Virus NOT DETECTED NOT DETECTED Final   Bordetella pertussis NOT DETECTED NOT DETECTED Final   Chlamydophila pneumoniae NOT DETECTED NOT DETECTED Final   Mycoplasma pneumoniae NOT DETECTED NOT DETECTED Final    Comment: Performed at [facility],  [place] [address], [place], [place] [address]  MRSA PCR Screening     Status: None   Collection Time: 12/04/17  5:57 AM  Result Value Ref Range Status   MRSA by PCR NEGATIVE NEGATIVE Final    Comment:        The GeneXpert MRSA Assay (FDA approved for NASAL specimens only), is one component of a comprehensive MRSA colonization surveillance program. It is not intended to diagnose MRSA infection nor to guide or monitor treatment for MRSA infections. Performed at [facility], [address], [place], [place] [address]   Aerobic/Anaerobic Culture (  surgical/deep wound)     Status: None (Preliminary result)   Collection Time: 12/05/17 10:11 AM  Result Value Ref Range Status   Specimen Description   Final    GROIN RIGHT Performed at [facility], [place] [address] [place], [place] [address]    Special Requests   Final    Immunocompromised Performed at [facility], [place], [place] [address]    Gram Stain   Final    RARE WBC PRESENT, PREDOMINANTLY PMN MODERATE GRAM NEGATIVE RODS FEW GRAM POSITIVE COCCI IN PAIRS FEW GRAM POSITIVE RODS Performed at [facility], [place] [address] [place], [place] [address]    Culture   Final    NORMAL SKIN FLORA NO ANAEROBES ISOLATED; CULTURE IN PROGRESS FOR 5 DAYS    Report Status PENDING  Incomplete  Cath Tip Culture     Status: None   Collection Time: 12/06/17  5:41 PM  Result Value Ref Range Status   Specimen Description   Final    CATH TIP Performed at [facility], [address] [place], [place] [address]    Special Requests   Final    NONE Performed at [facility], [address], [place], [place] [address]    Culture   Final    NO GROWTH 3 DAYS Performed at [facility], [place] [address] [place],  [address]    Report Status 12/10/2017 FINAL  Final    Coagulation Studies: No results for input(s): LABPROT, INR in the last 72 hours.  Urinalysis: No results  for input(s): COLORURINE, LABSPEC, PHURINE, GLUCOSEU, HGBUR, BILIRUBINUR, KETONESUR, PROTEINUR, UROBILINOGEN, NITRITE, LEUKOCYTESUR in the last 72 hours.  Invalid input(s): APPERANCEUR    Imaging: No results found.   Medications:   . sodium chloride    . sodium chloride 20 mL/hr at 12/09/17 1542   . amLODipine  5 mg Oral QHS  . atorvastatin  80 mg Oral Daily  . calcitRIOL  0.5 mcg Oral Daily  . carvedilol  12.5 mg Oral BID  . collagenase   Topical Daily  . docusate sodium  100 mg Oral BID  . doxycycline  100 mg Oral Q12H  . epoetin (EPOGEN/PROCRIT) injection  4,000 Units Intravenous Q M,W,F-HD  . feeding supplement (NEPRO CARB STEADY)  237 mL Oral BID BM  . feeding supplement (NEPRO CARB STEADY)  237 mL Oral BID BM  . ferrous sulfate  325 mg Oral Q24H  . furosemide  80 mg Oral BID  . heparin  5,000 Units Subcutaneous Q8H  . insulin aspart  0-15 Units Subcutaneous TID WC  . insulin aspart  3 Units Subcutaneous TID WC  . levETIRAcetam  1,000 mg Oral QHS  . lisinopril  10 mg Oral QHS  . mirtazapine  7.5 mg Oral QHS  . multivitamin  1 tablet Oral QHS  . sevelamer carbonate  800 mg Oral TID WC  . sodium bicarbonate  650 mg Oral BID  . sodium chloride flush  3 mL Intravenous Q12H   sodium chloride, acetaminophen **OR** acetaminophen, ondansetron **OR** ondansetron (ZOFRAN) IV, polyethylene glycol, sodium chloride flush, triamcinolone ointment  Assessment/ Plan:  57 y.o. male with past medical history of ESRD on HD MWF followed by [facility] nephrology, diabetes mellitus type 2, history of DVT, right below the knee amputation, hypertension, history of intracerebral hemorrhage, anemia of chronic kidney disease, secondary hyperparathyroidism, seizure disorder who presents now with fever.  [facility] nephrology/[address]/MWF.  1.  ESRD on HD MWF.  Patient could not get dialysis due to access malfunction  Tunneled dialysis cathter was placed on Friday but unfortunately had a complication  of excessive bleeding.  Aspirin, Plavix have been temporarily stopped. Patient now has a temporary dialysis catheter in the right femoral New catheter to be placed Monday or Tuesday as per vascular schedule Next hemodialysis planned for Monday  2.  Fever.  Influenza screen was found to be negative.  Chest x-ray negative for obvious pneumonia.   Potential source of right groin abscess.  Antibiotics as per hospitalist team WBC count normalized  3.  Anemia of chronic kidney disease.  Hemoglobin currently 9.7.   Continue low-dose EPO with hemodialysis  4.  Secondary hyperparathyroidism.  Monitor phosphorus.  Maintain the patient on Renvela 800 mg p.o. 3 times daily.  With meals   LOS: 6 Caetano Oberhaus 2/23/20191:24 PM

## 2017-12-11 LAB — BASIC METABOLIC PANEL
Anion gap: 12 (ref 5–15)
BUN: 26 mg/dL — ABNORMAL HIGH (ref 6–20)
CO2: 28 mmol/L (ref 22–32)
Calcium: 8.7 mg/dL — ABNORMAL LOW (ref 8.9–10.3)
Chloride: 99 mmol/L — ABNORMAL LOW (ref 101–111)
Creatinine, Ser: 4.74 mg/dL — ABNORMAL HIGH (ref 0.61–1.24)
GFR calc Af Amer: 15 mL/min — ABNORMAL LOW (ref >60)
GFR calc non Af Amer: 13 mL/min — ABNORMAL LOW (ref >60)
Glucose, Bld: 148 mg/dL — ABNORMAL HIGH (ref 65–99)
Potassium: 4 mmol/L (ref 3.5–5.1)
Sodium: 139 mmol/L (ref 135–145)

## 2017-12-11 LAB — CBC
HCT: 28.5 % — ABNORMAL LOW (ref 40.0–52.0)
Hemoglobin: 9 g/dL — ABNORMAL LOW (ref 13.0–18.0)
MCH: 27.3 pg (ref 26.0–34.0)
MCHC: 31.7 g/dL — ABNORMAL LOW (ref 32.0–36.0)
MCV: 86.2 fL (ref 80.0–100.0)
Platelets: 223 10*3/uL (ref 150–440)
RBC: 3.31 MIL/uL — ABNORMAL LOW (ref 4.40–5.90)
RDW: 17.5 % — ABNORMAL HIGH (ref 11.5–14.5)
WBC: 8 10*3/uL (ref 3.8–10.6)

## 2017-12-11 LAB — GLUCOSE, CAPILLARY
Glucose-Capillary: 138 mg/dL — ABNORMAL HIGH (ref 65–99)
Glucose-Capillary: 245 mg/dL — ABNORMAL HIGH (ref 65–99)
Glucose-Capillary: 95 mg/dL (ref 65–99)
Glucose-Capillary: 181 mg/dL — ABNORMAL HIGH (ref 65–99)

## 2017-12-11 MED ORDER — LORAZEPAM 2 MG/ML IJ SOLN
1.0000 mg | Freq: Four times a day (QID) | INTRAMUSCULAR | Status: DC | PRN
  Filled 2017-12-11: qty 1

## 2017-12-11 NOTE — Progress Notes (Signed)
[facility] [facility] [place] NAME: [name]    MR#:  [id number]  DATE OF BIRTH:  02-21-1961  SUBJECTIVE:  CHIEF COMPLAINT:   Chief Complaint  Patient presents with  . Fever   - Patient noted to be agitated last evening and required Haldol. Alert and oriented this morning. Had a fall where he was trying to put his pants on and slid to the floor. Did not hit his head.  REVIEW OF SYSTEMS:  Review of Systems  Constitutional: Positive for fever and malaise/fatigue. Negative for chills.  HENT: Negative for congestion, ear discharge, hearing loss and nosebleeds.   Eyes: Negative for blurred vision and double vision.  Respiratory: Negative for cough, shortness of breath and wheezing.   Cardiovascular: Negative for chest pain and palpitations.  Gastrointestinal: Negative for abdominal pain, constipation, diarrhea, nausea and vomiting.  Genitourinary: Negative for dysuria.  Musculoskeletal: Positive for myalgias.  Neurological: Negative for dizziness, speech change, focal weakness, seizures and headaches.    DRUG ALLERGIES:   Allergies  Allergen Reactions  . Vancomycin Shortness Of Breath    Eyes watering, SOB, wheezing  . Cefepime Other (See Comments)  . Methotrexate Other (See Comments)    Blood count drops  . Tape     VITALS:  Blood pressure (!) 165/83, pulse 74, temperature 98.4 F (36.9 C), temperature source Oral, resp. rate 20, height 5' 11"  (1.803 m), weight 86.2 kg (190 lb 0.6 oz), SpO2 90 %.  PHYSICAL EXAMINATION:  Physical Exam  GENERAL:  57 y.o.-year-old patient lying in the bed with no acute distress.  EYES: Pupils equal, round, reactive to light and accommodation. No scleral icterus. Extraocular muscles intact.  HEENT: Head atraumatic, normocephalic. Oropharynx and nasopharynx clear.  NECK:  Supple, no jugular venous distention. No thyroid enlargement, no tenderness.  LUNGS: Normal breath sounds bilaterally, no wheezing,  rales,rhonchi or crepitation. No use of accessory muscles of respiration. Decreased bibasilar breath sounds noted CARDIOVASCULAR: S1, S2 normal. No murmurs, rubs, or gallops.  ABDOMEN: Soft, nontender, nondistended. Bowel sounds present. No organomegaly or mass.  EXTREMITIES: No pedal edema, cyanosis, or clubbing. Right groin dialysis catheter noted. No bleeding noted around the dressing. Right BKA NEUROLOGIC: Cranial nerves II through XII are intact. Muscle strength 5/5 in all extremities. Sensation intact. Gait not checked. Global weakness PSYCHIATRIC: The patient is alert and oriented x 3.  SKIN: No obvious rash, lesion, or ulcer.    LABORATORY PANEL:   CBC Recent Labs  Lab 12/11/17 0608  WBC 8.0  HGB 9.0*  HCT 28.5*  PLT 223   ------------------------------------------------------------------------------------------------------------------  Chemistries  Recent Labs  Lab 12/11/17 0608  NA 139  K 4.0  CL 99*  CO2 28  GLUCOSE 148*  BUN 26*  CREATININE 4.74*  CALCIUM 8.7*   ------------------------------------------------------------------------------------------------------------------  Cardiac Enzymes No results for input(s): TROPONINI in the last 168 hours. ------------------------------------------------------------------------------------------------------------------  RADIOLOGY:  No results found.  EKG:   Orders placed or performed during the hospital encounter of 12/04/17  . ED EKG 12-Lead  . ED EKG 12-Lead  . EKG 12-Lead  . EKG 12-Lead    ASSESSMENT AND PLAN:   57 year old African-American male with past medical history significant for end-stage renal disease on hemodialysis, history of DVT, history of intracranial hemorrhage, anemia of chronic disease, diabetes mellitus brought to the hospital secondary to sepsis with fever, leukocytosis and an encephalopathy  1.- Sepsis-secondary to Right groin abscess-currently on daptomycin, doxycycline. -Wound  cultures growing group B  strep. Discontinue daptomycin. -Daptomycin was started because patient is allergic to vancomycin. Continue just doxycycline. Low-grade fevers yesterday, none today. -Permanent dialysis catheter tip cultures are negative so far. Blood cultures are negative so far.  -Appreciate surgical evaluation, no intervention necessary. -Currently also has a right groin catheter. Monitor carefully  2. End-stage renal disease-on Monday, Wednesday and Friday dialysis. -Appreciate nephrology consult. Had dialysis yesterday per schedule -We'll need permacath placement, likely Monday or Tuesday. For now has a right groin temporary dialysis catheter.  3. Hypertension-continue home medications. On lisinopril, Norvasc and Coreg.  4.-Anemia of chronic disease-on iron supplements.  5. Seizure disorder-continue Keppra  6. DVT prophylaxis- held subcutaneous heparin due to bleeding at the temporary dialysis catheter insertion site yesterday   All the records are reviewed and case discussed with Care Management/Social Workerr. Management plans discussed with the patient, family and they are in agreement.  CODE STATUS: Full Code  TOTAL TIME TAKING CARE OF THIS PATIENT: 38  minutes.   POSSIBLE D/C IN 2-3 DAYS, DEPENDING ON CLINICAL CONDITION.   Gladstone Lighter M.D on 12/11/2017 at 3:12 PM  Between 7am to 6pm - Pager - [phone]  After 6pm go to [email or web address] - password [email or web address] [facility]  Office  [phone]  CC: Primary care physician; [name], MD

## 2017-12-11 NOTE — Progress Notes (Signed)
Notified Dr. Jannifer [place] of patient being anxious and agitated. New order put in at this time.

## 2017-12-11 NOTE — Progress Notes (Signed)
[facility]  ROUNDING NOTE   Subjective:  Patient known to [place] from prior admissions.   He undergoes dialysis on Monday, Wednesday, Friday. Patient brought in by his wife for fever. His influenza screen was found to be negative. Source of fever is thought to be rt groin abscess Patient had malfunction of his dialysis cath which was removed And a new catheter was placed on the left IJ on February 21.  Unfortunately, patient had a complication of excessive bleeding from exit site therefore the catheter was removed and compression dressing was placed.  A temporary dialysis catheter was placed in the right groin  Patient underwent dialysis on Friday and saturday He is able to eat and drink without any nausea Patient slid down from his bed onto the floor while putting on his pants.  He says that it happened at home all the time.  He did not hit anything.  Denies any pain  Objective:  Vital signs in last 24 hours:  Temp:  [98.4 F (36.9 C)-99 F (37.2 C)] 98.4 F (36.9 C) (02/24 0431) Pulse Rate:  [65-75] 74 (02/24 0431) Resp:  [15-20] 20 (02/24 0431) BP: (156-172)/(81-101) 165/83 (02/24 0431) SpO2:  [90 %-100 %] 90 % (02/24 0431) Weight:  [86.2 kg (190 lb 0.6 oz)] 86.2 kg (190 lb 0.6 oz) (02/23 1650)  Weight change: 5.32 kg (11 lb 11.7 oz) Filed Weights   12/09/17 2240 12/10/17 1252 12/10/17 1650  Weight: 83.5 kg (184 lb 1.4 oz) 84.7 kg (186 lb 11.7 oz) 86.2 kg (190 lb 0.6 oz)    Intake/Output: I/O last 3 completed shifts: In: 109.6 [IV Piggyback:109.6] Out: 5573 [Urine:475; UKGUR:4270]   Intake/Output this shift:  No intake/output data recorded.  Physical Exam: General: No acute distress,  Head: Normocephalic, atraumatic. Moist oral mucosal membranes  Eyes: Anicteric  Neck: Supple, trachea midline  Lungs:  Clear to auscultation, normal effort  Heart: S1S2 no rubs  Abdomen:  Soft, nontender, bowel sounds present,   Extremities: R BKA, trace LE edema   Neurologic: Awake, alert, following commands     Access:  Right femoral Cath    Basic Metabolic Panel: Recent Labs  Lab 12/08/17 0320 12/11/17 0608  NA 142 139  K 5.3* 4.0  CL 106 99*  CO2 19* 28  GLUCOSE 131* 148*  BUN 96* 26*  CREATININE 11.51* 4.74*  CALCIUM 9.1 8.7*    Liver Function Tests: No results for input(s): AST, ALT, ALKPHOS, BILITOT, PROT, ALBUMIN in the last 168 hours. No results for input(s): LIPASE, AMYLASE in the last 168 hours. No results for input(s): AMMONIA in the last 168 hours.  CBC: Recent Labs  Lab 12/08/17 0320 12/11/17 0608  WBC 5.6 8.0  HGB 9.7* 9.0*  HCT 30.1* 28.5*  MCV 87.0 86.2  PLT 209 223    Cardiac Enzymes: Recent Labs  Lab 12/10/17 1438  CKTOTAL 53    BNP: Invalid input(s): POCBNP  CBG: Recent Labs  Lab 12/10/17 1726 12/10/17 1920 12/10/17 2112 12/11/17 0729 12/11/17 1146  GLUCAP 90 124* 129* 138* 245*    Microbiology: Results for orders placed or performed during the hospital encounter of 12/04/17  Blood Culture (routine x 2)     Status: None   Collection Time: 12/04/17 12:42 AM  Result Value Ref Range Status   Specimen Description BLOOD RIGHT FOREARM  Final   Special Requests   Final    BOTTLES DRAWN AEROBIC AND ANAEROBIC Blood Culture adequate volume   Culture   Final  NO GROWTH 5 DAYS Performed at [facility], [address] [place], [place] [address]    Report Status 12/09/2017 FINAL  Final  Blood Culture (routine x 2)     Status: None   Collection Time: 12/04/17 12:42 AM  Result Value Ref Range Status   Specimen Description BLOOD RIGHT WRIST  Final   Special Requests   Final    BOTTLES DRAWN AEROBIC AND ANAEROBIC Blood Culture adequate volume   Culture   Final    NO GROWTH 5 DAYS Performed at [facility], [address], [place], [place] [address]    Report Status 12/09/2017 FINAL  Final  Urine culture     Status: None   Collection Time: 12/04/17  2:40 AM  Result  Value Ref Range Status   Specimen Description   Final    URINE, RANDOM Performed at [facility], [address], [place], [place] [address]    Special Requests   Final    NONE Performed at [name], [address], [place], [place] [address]    Culture   Final    NO GROWTH Performed at [facility], [place] [address], [place], [place] [address]    Report Status 12/05/2017 FINAL  Final  Respiratory Panel by PCR     Status: None   Collection Time: 12/04/17  3:40 AM  Result Value Ref Range Status   Adenovirus NOT DETECTED NOT DETECTED Final   Coronavirus 229E NOT DETECTED NOT DETECTED Final   Coronavirus HKU1 NOT DETECTED NOT DETECTED Final   Coronavirus NL63 NOT DETECTED NOT DETECTED Final   Coronavirus OC43 NOT DETECTED NOT DETECTED Final   Metapneumovirus NOT DETECTED NOT DETECTED Final   Rhinovirus / Enterovirus NOT DETECTED NOT DETECTED Final   Influenza A NOT DETECTED NOT DETECTED Final   Influenza B NOT DETECTED NOT DETECTED Final   Parainfluenza Virus 1 NOT DETECTED NOT DETECTED Final   Parainfluenza Virus 2 NOT DETECTED NOT DETECTED Final   Parainfluenza Virus 3 NOT DETECTED NOT DETECTED Final   Parainfluenza Virus 4 NOT DETECTED NOT DETECTED Final   Respiratory Syncytial Virus NOT DETECTED NOT DETECTED Final   Bordetella pertussis NOT DETECTED NOT DETECTED Final   Chlamydophila pneumoniae NOT DETECTED NOT DETECTED Final   Mycoplasma pneumoniae NOT DETECTED NOT DETECTED Final    Comment: Performed at [facility], [place] [address] [place], [place] [address]  MRSA PCR Screening     Status: None   Collection Time: 12/04/17  5:57 AM  Result Value Ref Range Status   MRSA by PCR NEGATIVE NEGATIVE Final    Comment:        The GeneXpert MRSA Assay (FDA approved for NASAL specimens only), is one component of a comprehensive MRSA colonization surveillance program. It is not intended to diagnose MRSA infection nor to guide or monitor  treatment for MRSA infections. Performed at [facility], [address] [place], [place] [address]   Aerobic/Anaerobic Culture (surgical/deep wound)     Status: None   Collection Time: 12/05/17 10:11 AM  Result Value Ref Range Status   Specimen Description   Final    GROIN RIGHT Performed at [facility], [place] [address], [place], [place] [address]    Special Requests   Final    Immunocompromised Performed at [facility], [place], [place] [address]    Gram Stain   Final    RARE WBC PRESENT, PREDOMINANTLY PMN  MODERATE GRAM NEGATIVE RODS FEW GRAM POSITIVE COCCI IN PAIRS FEW GRAM POSITIVE RODS Performed at [facility], [place] [address], [place], [place] [address]    Culture   Final    RARE GROUP B STREP(S.AGALACTIAE)ISOLATED TESTING AGAINST S. AGALACTIAE NOT ROUTINELY PERFORMED DUE TO PREDICTABILITY OF AMP/PEN/VAN SUSCEPTIBILITY. WITHIN MIXED NORMAL SKIN FLORA MIXED ANAEROBIC FLORA PRESENT.  CALL LAB IF FURTHER IID REQUIRED.    Report Status 12/10/2017 FINAL  Final  Cath Tip Culture     Status: None   Collection Time: 12/06/17  5:41 PM  Result Value Ref Range Status   Specimen Description   Final    CATH TIP Performed at [facility], [address], [place], [place] [address]    Special Requests   Final    NONE Performed at [facility], [address], [place], [place] [address]    Culture   Final    NO GROWTH 3 DAYS Performed at [facility], [place] [address] [place], [place] [address]    Report Status 12/10/2017 FINAL  Final    Coagulation Studies: No results for input(s): LABPROT, INR in the last 72 hours.  Urinalysis: No results for input(s): COLORURINE, LABSPEC, PHURINE, GLUCOSEU, HGBUR, BILIRUBINUR, KETONESUR, PROTEINUR, UROBILINOGEN, NITRITE, LEUKOCYTESUR in the last 72 hours.  Invalid input(s): APPERANCEUR    Imaging: No results found.   Medications:   . DAPTOmycin (CUBICIN)   IV Stopped (12/10/17 1551)   . amLODipine  5 mg Oral QHS  . calcitRIOL  0.5 mcg Oral Daily  . carvedilol  12.5 mg Oral BID  . collagenase   Topical Daily  . docusate sodium  100 mg Oral BID  . doxycycline  100 mg Oral Q12H  . epoetin (EPOGEN/PROCRIT) injection  4,000 Units Intravenous Q M,W,F-HD  . feeding supplement (NEPRO CARB STEADY)  237 mL Oral BID BM  . ferrous sulfate  325 mg Oral Q24H  . furosemide  80 mg Oral BID  . insulin aspart  0-15 Units Subcutaneous TID WC  . insulin aspart  3 Units Subcutaneous TID WC  . levETIRAcetam  1,000 mg Oral QHS  . lisinopril  10 mg Oral QHS  . mirtazapine  7.5 mg Oral QHS  . multivitamin  1 tablet Oral QHS  . sevelamer carbonate  800 mg Oral TID WC  . sodium bicarbonate  650 mg Oral BID   acetaminophen **OR** acetaminophen, hydrALAZINE, LORazepam, ondansetron **OR** ondansetron (ZOFRAN) IV, polyethylene glycol, triamcinolone ointment  Assessment/ Plan:  57 y.o. African-American male with past medical history of ESRD on HD MWF followed by [facility] nephrology, diabetes mellitus type 2, history of DVT, right below the knee amputation, hypertension, history of intracerebral hemorrhage, anemia of chronic kidney disease, secondary hyperparathyroidism, seizure disorder who presents now with fever.  [facility] nephrology/[address]/MWF.  1.  ESRD on HD MWF.    Tunneled dialysis cathter was placed on Friday but unfortunately had a complication of excessive bleeding.  Aspirin, Plavix have been temporarily stopped. Patient now has a right femoral temporary dialysis catheter  New catheter to be placed Monday or Tuesday as per vascular schedule Next hemodialysis planned for Monday  2.  Fever.  Influenza screen was found to be negative.  Chest x-ray negative for obvious pneumonia.   Potential source of right groin abscess.  Antibiotics as per hospitalist team WBC count normalized Due to recurrence of low-grade fever, daptomycin was restarted  empirically clinically, patient looks well  3.  Anemia of chronic  kidney disease.  Hemoglobin currently 9.0 Continue low-dose EPO with hemodialysis  4.  Secondary hyperparathyroidism.  Monitor phosphorus.  Maintain the patient on Renvela 800 mg p.o. 3 times daily with meals    LOS: 7 Iniya Matzek 2/24/20193:02 PM

## 2017-12-11 NOTE — Plan of Care (Signed)
Pt with improving AMS. Lorazepam given once for anxiety/irritability with good results. Right femoral line in place. Santyl applied to sacral wound. NPO after midnight for perm cath placement.

## 2017-12-12 ENCOUNTER — Encounter: Payer: Self-pay | Admitting: Vascular Surgery

## 2017-12-12 ENCOUNTER — Inpatient Hospital Stay
Admission: EM | Disposition: A | Discharge status: Home / Self Care | Payer: Self-pay | Source: Home / Self Care | Attending: Internal Medicine

## 2017-12-12 DIAGNOSIS — N186 End stage renal disease: Secondary | ICD-10-CM

## 2017-12-12 HISTORY — PX: DIALYSIS/PERMA CATHETER INSERTION: CATH118288

## 2017-12-12 LAB — BASIC METABOLIC PANEL
Anion gap: 10 (ref 5–15)
BUN: 49 mg/dL — ABNORMAL HIGH (ref 6–20)
CO2: 26 mmol/L (ref 22–32)
Calcium: 8.8 mg/dL — ABNORMAL LOW (ref 8.9–10.3)
Chloride: 100 mmol/L — ABNORMAL LOW (ref 101–111)
Creatinine, Ser: 6.98 mg/dL — ABNORMAL HIGH (ref 0.61–1.24)
GFR calc Af Amer: 9 mL/min — ABNORMAL LOW (ref >60)
GFR calc non Af Amer: 8 mL/min — ABNORMAL LOW (ref >60)
Glucose, Bld: 126 mg/dL — ABNORMAL HIGH (ref 65–99)
Potassium: 4.4 mmol/L (ref 3.5–5.1)
Sodium: 136 mmol/L (ref 135–145)

## 2017-12-12 LAB — GLUCOSE, CAPILLARY
Glucose-Capillary: 131 mg/dL — ABNORMAL HIGH (ref 65–99)
Glucose-Capillary: 102 mg/dL — ABNORMAL HIGH (ref 65–99)
Glucose-Capillary: 143 mg/dL — ABNORMAL HIGH (ref 65–99)

## 2017-12-12 SURGERY — DIALYSIS/PERMA CATHETER INSERTION
Anesthesia: Moderate Sedation

## 2017-12-12 MED ORDER — HYDROCODONE-ACETAMINOPHEN 5-325 MG PO TABS: 1.0000 | ORAL_TABLET | Freq: Four times a day (QID) | ORAL | 0 refills | Status: DC | PRN

## 2017-12-12 MED ORDER — MIDAZOLAM HCL 5 MG/5ML IJ SOLN
INTRAMUSCULAR | Status: AC
  Filled 2017-12-12: qty 5

## 2017-12-12 MED ORDER — DEXTROSE 50 % IV SOLN
12.5000 g | Freq: Once | INTRAVENOUS | Status: AC
  Administered 2017-12-12: 12.5 g via INTRAVENOUS

## 2017-12-12 MED ORDER — DOXYCYCLINE HYCLATE 100 MG PO TABS: 100.0000 mg | ORAL_TABLET | Freq: Two times a day (BID) | ORAL | 0 refills | Status: AC

## 2017-12-12 MED ORDER — HEPARIN SODIUM (PORCINE) 10000 UNIT/ML IJ SOLN
INTRAMUSCULAR | Status: AC
  Filled 2017-12-12: qty 1

## 2017-12-12 MED ORDER — LIDOCAINE-EPINEPHRINE (PF) 1 %-1:200000 IJ SOLN
INTRAMUSCULAR | Status: AC
  Filled 2017-12-12: qty 30

## 2017-12-12 MED ORDER — FENTANYL CITRATE (PF) 100 MCG/2ML IJ SOLN
INTRAMUSCULAR | Status: DC | PRN
  Administered 2017-12-12 (×3): 50 ug via INTRAVENOUS

## 2017-12-12 MED ORDER — OCUVITE-LUTEIN PO CAPS
1.0000 | ORAL_CAPSULE | Freq: Every day | ORAL | Status: DC
  Filled 2017-12-12: qty 1

## 2017-12-12 MED ORDER — MIDAZOLAM HCL 2 MG/2ML IJ SOLN
INTRAMUSCULAR | Status: DC | PRN
  Administered 2017-12-12 (×2): 1 mg via INTRAVENOUS
  Administered 2017-12-12: 2 mg via INTRAVENOUS

## 2017-12-12 MED ORDER — FENTANYL CITRATE (PF) 100 MCG/2ML IJ SOLN
INTRAMUSCULAR | Status: AC
  Filled 2017-12-12: qty 2

## 2017-12-12 MED ORDER — HEPARIN (PORCINE) IN NACL 2-0.9 UNIT/ML-% IJ SOLN
INTRAMUSCULAR | Status: AC
  Filled 2017-12-12: qty 500

## 2017-12-12 MED ORDER — SODIUM CHLORIDE 0.9 % IV SOLN
INTRAVENOUS | Status: DC
  Administered 2017-12-12: 1000 mL via INTRAVENOUS

## 2017-12-12 MED ORDER — HEPARIN (PORCINE) IN NACL 2-0.9 UNIT/ML-% IJ SOLN
INTRAMUSCULAR | Status: AC
Start: 2017-12-12 — End: 2017-12-12
  Filled 2017-12-12: qty 500

## 2017-12-12 MED ORDER — INSULIN ASPART 100 UNIT/ML FLEXPEN: PEN_INJECTOR | SUBCUTANEOUS | 3 refills | Status: DC

## 2017-12-12 MED ORDER — DEXTROSE 50 % IV SOLN
INTRAVENOUS | Status: AC
  Filled 2017-12-12: qty 50

## 2017-12-12 SURGICAL SUPPLY — 6 items
CATH PALINDROME RT-P 15FX23CM (CATHETERS) ×2 IMPLANT
DERMABOND ADVANCED (GAUZE/BANDAGES/DRESSINGS) ×1
DERMABOND ADVANCED .7 DNX12 (GAUZE/BANDAGES/DRESSINGS) ×1 IMPLANT
PACK ANGIOGRAPHY (CUSTOM PROCEDURE TRAY) ×2 IMPLANT
SUT MNCRL AB 4-0 PS2 18 (SUTURE) ×2 IMPLANT
SUT PROLENE 0 CT 1 30 (SUTURE) ×2 IMPLANT

## 2017-12-12 NOTE — Progress Notes (Signed)
[name]

## 2017-12-12 NOTE — H&P (Signed)
[facility] History & Physical Update  The patient was interviewed and re-examined.  The patient's previous History and Physical has been reviewed and is unchanged.  There is no change in the plan of care. We plan to proceed with the scheduled procedure.  Leotis Pain, MD  12/12/2017, 3:25 PM

## 2017-12-12 NOTE — Progress Notes (Signed)
Post HD TX  

## 2017-12-12 NOTE — Op Note (Signed)
OPERATIVE NOTE    PRE-OPERATIVE DIAGNOSIS: 1. ESRD   POST-OPERATIVE DIAGNOSIS: same as above  PROCEDURE: 1. Ultrasound guidance for vascular access to the left internal jugular vein 2. Fluoroscopic guidance for placement of catheter 3. Placement of a 23 cm tip to cuff tunneled hemodialysis catheter via the left internal jugular vein  SURGEON: Leotis Pain, MD  ANESTHESIA:  Local with Moderate conscious sedation for approximately 15 minutes using 4 mg of Versed and 150 mcg of Fentanyl  ESTIMATED BLOOD LOSS: 2 cc  FLUORO TIME: less than one minute  CONTRAST: none  FINDING(S): 1.  Patent left internal jugular vein  SPECIMEN(S):  None  INDICATIONS:   [name] is a 57 y.o. male who presents with renal failure.  His right IJ permcath was removed for infection recently.  The patient needs long term dialysis access for their ESRD, and a Permcath is necessary.  Risks and benefits are discussed and informed consent is obtained.    DESCRIPTION: After obtaining full informed written consent, the patient was brought back to the vascular suited. The patient's left neck and chest were sterilely prepped and draped in a sterile surgical field was created. Moderate conscious sedation was administered during a face to face encounter with the patient throughout the procedure with my supervision of the RN administering medicines and monitoring the patient's vital signs, pulse oximetry, telemetry and mental status throughout from the start of the procedure until the patient was taken to the recovery room.  The left internal jugular vein was visualized with ultrasound and found to be patent. It was then accessed under direct ultrasound guidance and a permanent image was recorded. A wire was placed. After skin nick and dilatation, the peel-away sheath was placed over the wire. I then turned my attention to an area under the clavicle. Approximately 1-2 fingerbreadths below the clavicle a small  counterincision was created and tunneled from the subclavicular incision to the access site. Using fluoroscopic guidance, a 23 centimeter tip to cuff tunneled hemodialysis catheter was selected, and tunneled from the subclavicular incision to the access site. It was then placed through the peel-away sheath and the peel-away sheath was removed. Using fluoroscopic guidance the catheter tips were parked in the right atrium. The appropriate distal connectors were placed. It withdrew blood well and flushed easily with heparinized saline and a concentrated heparin solution was then placed. It was secured to the chest wall with 2 Prolene sutures. The access incision was closed single 4-0 Monocryl. A 4-0 Monocryl pursestring suture was placed around the exit site. Sterile dressings were placed. The patient tolerated the procedure well and was taken to the recovery room in stable condition.  COMPLICATIONS: None  CONDITION: Stable  Leotis Pain  12/12/2017, 5:34 PM   This note was created with Dragon Medical transcription system. Any errors in dictation are purely unintentional.

## 2017-12-12 NOTE — Progress Notes (Signed)
[facility]  ROUNDING NOTE   Subjective:   Seen and examined on hemodialysis. Tolerating treatment well. Temp HD right femoral catheter   Objective:  Vital signs in last 24 hours:  Temp:  [98.8 F (37.1 C)-98.9 F (37.2 C)] 98.8 F (37.1 C) (02/25 0711) Pulse Rate:  [63-78] 63 (02/25 0711) Resp:  [18-20] 18 (02/25 0711) BP: (132-154)/(62-86) 132/62 (02/25 0711) SpO2:  [95 %-99 %] 99 % (02/25 0711)  Weight change:  Filed Weights   12/09/17 2240 12/10/17 1252 12/10/17 1650  Weight: 83.5 kg (184 lb 1.4 oz) 84.7 kg (186 lb 11.7 oz) 86.2 kg (190 lb 0.6 oz)    Intake/Output: I/O last 3 completed shifts: In: 0  Out: 575 [Urine:575]   Intake/Output this shift:  No intake/output data recorded.  Physical Exam: General: No acute distress,  Head: Normocephalic, atraumatic. Moist oral mucosal membranes  Eyes: Anicteric  Neck: Supple, trachea midline  Lungs:  Clear to auscultation, normal effort  Heart: S1S2 no rubs  Abdomen:  Soft, nontender, bowel sounds present  Extremities: R BKA, no LE edema  Neurologic: Awake, alert, following commands  Access: Right femoral Cath    Basic Metabolic Panel: Recent Labs  Lab 12/08/17 0320 12/11/17 0608 12/12/17 0542  NA 142 139 136  K 5.3* 4.0 4.4  CL 106 99* 100*  CO2 19* 28 26  GLUCOSE 131* 148* 126*  BUN 96* 26* 49*  CREATININE 11.51* 4.74* 6.98*  CALCIUM 9.1 8.7* 8.8*    Liver Function Tests: No results for input(s): AST, ALT, ALKPHOS, BILITOT, PROT, ALBUMIN in the last 168 hours. No results for input(s): LIPASE, AMYLASE in the last 168 hours. No results for input(s): AMMONIA in the last 168 hours.  CBC: Recent Labs  Lab 12/08/17 0320 12/11/17 0608  WBC 5.6 8.0  HGB 9.7* 9.0*  HCT 30.1* 28.5*  MCV 87.0 86.2  PLT 209 223    Cardiac Enzymes: Recent Labs  Lab 12/10/17 1438  CKTOTAL 53    BNP: Invalid input(s): POCBNP  CBG: Recent Labs  Lab 12/11/17 1146 12/11/17 1638 12/11/17 2139  12/12/17 0740 12/12/17 1153  GLUCAP 245* 95 181* 131* 102*    Microbiology: Results for orders placed or performed during the hospital encounter of 12/04/17  Blood Culture (routine x 2)     Status: None   Collection Time: 12/04/17 12:42 AM  Result Value Ref Range Status   Specimen Description BLOOD RIGHT FOREARM  Final   Special Requests   Final    BOTTLES DRAWN AEROBIC AND ANAEROBIC Blood Culture adequate volume   Culture   Final    NO GROWTH 5 DAYS Performed at [facility], [address] [place], [place] [address]    Report Status 12/09/2017 FINAL  Final  Blood Culture (routine x 2)     Status: None   Collection Time: 12/04/17 12:42 AM  Result Value Ref Range Status   Specimen Description BLOOD RIGHT WRIST  Final   Special Requests   Final    BOTTLES DRAWN AEROBIC AND ANAEROBIC Blood Culture adequate volume   Culture   Final    NO GROWTH 5 DAYS Performed at [facility], [address], [place], [place] [address]    Report Status 12/09/2017 FINAL  Final  Urine culture     Status: None   Collection Time: 12/04/17  2:40 AM  Result Value Ref Range Status   Specimen Description   Final    URINE, RANDOM Performed at [facility], [address] [place], [place] [address]    Special Requests   Final    NONE Performed at [facility], [address] [place], [place] [address]    Culture   Final    NO GROWTH Performed at [facility], [place] [address] [place], [place] [address]    Report Status 12/05/2017 FINAL  Final  Respiratory Panel by PCR     Status: None   Collection Time: 12/04/17  3:40 AM  Result Value Ref Range Status   Adenovirus NOT DETECTED NOT DETECTED Final   Coronavirus 229E NOT DETECTED NOT DETECTED Final   Coronavirus HKU1 NOT DETECTED NOT DETECTED Final   Coronavirus NL63 NOT DETECTED NOT DETECTED Final   Coronavirus OC43 NOT DETECTED NOT DETECTED Final   Metapneumovirus NOT DETECTED NOT DETECTED Final    Rhinovirus / Enterovirus NOT DETECTED NOT DETECTED Final   Influenza A NOT DETECTED NOT DETECTED Final   Influenza B NOT DETECTED NOT DETECTED Final   Parainfluenza Virus 1 NOT DETECTED NOT DETECTED Final   Parainfluenza Virus 2 NOT DETECTED NOT DETECTED Final   Parainfluenza Virus 3 NOT DETECTED NOT DETECTED Final   Parainfluenza Virus 4 NOT DETECTED NOT DETECTED Final   Respiratory Syncytial Virus NOT DETECTED NOT DETECTED Final   Bordetella pertussis NOT DETECTED NOT DETECTED Final   Chlamydophila pneumoniae NOT DETECTED NOT DETECTED Final   Mycoplasma pneumoniae NOT DETECTED NOT DETECTED Final    Comment: Performed at [facility], [place] [address] [place], [place] [address]  MRSA PCR Screening     Status: None   Collection Time: 12/04/17  5:57 AM  Result Value Ref Range Status   MRSA by PCR NEGATIVE NEGATIVE Final    Comment:        The GeneXpert MRSA Assay (FDA approved for NASAL specimens only), is one component of a comprehensive MRSA colonization surveillance program. It is not intended to diagnose MRSA infection nor to guide or monitor treatment for MRSA infections. Performed at [facility], [address] [place], [place] [address]   Aerobic/Anaerobic Culture (surgical/deep wound)     Status: None   Collection Time: 12/05/17 10:11 AM  Result Value Ref Range Status   Specimen Description   Final    GROIN RIGHT Performed at [facility], [place] [address], [place], [place] [address]    Special Requests   Final    Immunocompromised Performed at [facility], [place], [place] [address]    Gram Stain   Final    RARE WBC PRESENT, PREDOMINANTLY PMN MODERATE GRAM NEGATIVE RODS FEW GRAM POSITIVE COCCI IN PAIRS FEW GRAM POSITIVE RODS Performed at [facility], [place] [address], [place], [place] [address]    Culture   Final    RARE GROUP B STREP(S.AGALACTIAE)ISOLATED TESTING AGAINST S. AGALACTIAE NOT ROUTINELY  PERFORMED DUE TO PREDICTABILITY OF AMP/PEN/VAN SUSCEPTIBILITY. WITHIN MIXED NORMAL SKIN FLORA MIXED ANAEROBIC FLORA PRESENT.  CALL LAB IF FURTHER IID REQUIRED.    Report Status 12/10/2017 FINAL  Final  Cath Tip Culture     Status: None   Collection Time: 12/06/17  5:41 PM  Result Value Ref Range Status   Specimen Description   Final    CATH TIP Performed at [facility], [address], [place], [place] [address]    Special Requests   Final    NONE Performed at [facility], [address] [place], [place] [address]  Culture   Final    NO GROWTH 3 DAYS Performed at [facility], [place] [address], [place], [place] [address]    Report Status 12/10/2017 FINAL  Final    Coagulation Studies: No results for input(s): LABPROT, INR in the last 72 hours.  Urinalysis: No results for input(s): COLORURINE, LABSPEC, PHURINE, GLUCOSEU, HGBUR, BILIRUBINUR, KETONESUR, PROTEINUR, UROBILINOGEN, NITRITE, LEUKOCYTESUR in the last 72 hours.  Invalid input(s): APPERANCEUR    Imaging: No results found.   Medications:   . sodium chloride     . amLODipine  5 mg Oral QHS  . calcitRIOL  0.5 mcg Oral Daily  . carvedilol  12.5 mg Oral BID  . collagenase   Topical Daily  . docusate sodium  100 mg Oral BID  . doxycycline  100 mg Oral Q12H  . epoetin (EPOGEN/PROCRIT) injection  4,000 Units Intravenous Q M,W,F-HD  . feeding supplement (NEPRO CARB STEADY)  237 mL Oral BID BM  . ferrous sulfate  325 mg Oral Q24H  . furosemide  80 mg Oral BID  . insulin aspart  0-15 Units Subcutaneous TID WC  . insulin aspart  3 Units Subcutaneous TID WC  . levETIRAcetam  1,000 mg Oral QHS  . lisinopril  10 mg Oral QHS  . mirtazapine  7.5 mg Oral QHS  . multivitamin  1 tablet Oral QHS  . multivitamin-lutein  1 capsule Oral q1800  . sevelamer carbonate  800 mg Oral TID WC  . sodium bicarbonate  650 mg Oral BID   acetaminophen **OR** acetaminophen, hydrALAZINE, LORazepam, ondansetron  **OR** ondansetron (ZOFRAN) IV, polyethylene glycol, triamcinolone ointment  Assessment/ Plan:  57 y.o. black male with ESRD on HD MWF followed by [facility] nephrology, diabetes mellitus type 2, history of DVT, right below the knee amputation, hypertension, history of intracerebral hemorrhage, anemia of chronic kidney disease, secondary hyperparathyroidism, seizure disorder   [facility] nephrology/[address]/MWF.  1.  ESRD on HD MWF. Treatment through right femoral temp catheter Tunneled catheter to be placed later today.   2.  Hypertension: blood pressure is at goal.  - amlodipine, carvedilol, furosemide, lisinopril  3.  Anemia of chronic kidney disease.  Hemoglobin 9.0 - EPO with HD.    4.  Secondary hyperparathyroidism.   - sevelamer with meals - calcitriol.    LOS: 8 Bayler Gehrig 2/25/20193:52 PM

## 2017-12-12 NOTE — Progress Notes (Signed)
Initial Nutrition Assessment  DOCUMENTATION CODES:   Not applicable  INTERVENTION:   Nepro Shake po BID, each supplement provides 425 kcal and 19 grams protein  Rena-vite daily   Ocuvite daily for wound healing (provides zinc, vitamin A, vitamin C, Vitamin E, copper, and selenium)  Bowel regimen as needed per MD  NUTRITION DIAGNOSIS:   Increased nutrient needs related to chronic illness(ESRD on HD) as evidenced by increased estimated needs from protein.  GOAL:   Patient will meet greater than or equal to 90% of their needs  MONITOR:   PO intake, Supplement acceptance, Labs, Weight trends, I & O's, Skin  REASON FOR ASSESSMENT:   LOS    ASSESSMENT:   57-year-old African-American male with past medical history significant for end-stage renal disease on hemodialysis, history of DVT, history of intracranial hemorrhage, anemia of chronic disease, diabetes mellitus brought to the hospital secondary to sepsis with fever, leukocytosis and an encephalopathy   Met with pt in room 2/20. Pt reported good appetite and oral intake at baseline. Pt was sitting up and eating at time of RD visit. Pt documented to be eating 30-100% of meals. Pt with new AMS since RD visit. Pt ordered for Nepro and Rena-vite; RD will add Ocuvite to encourage wound healing. Per chart, pt weight stable pta but appears to have gained 13lbs since admit. RD will continue to monitor. Pt NPO today for perm cath placement.      Medications reviewed and include: calcitriol, colace, doxycycline, epoetin, ferrous sulfate, lasix, insulin, remeron, rena-vite, Na biacarb  Labs reviewed: K 4.4 wnl, Cl 100(L), BUN 49(H), creat 6.98(H), Ca 8.8(L) Hgb 9.0(L), Hct 28.5(L)  Nutrition-Focused physical exam completed. Findings are no fat depletion, no muscle depletion, and mild generalized edema.   Diet Order:  Aspiration precautions Fall precautions Diet NPO time specified  EDUCATION NEEDS:   Education needs have been  addressed  Skin:  Skin Assessment: (Stage I sacrum )  Last BM:  2/24- TYPE 3  Height:   Ht Readings from Last 1 Encounters:  12/09/17 5' 11" (1.803 m)    Weight:   Wt Readings from Last 1 Encounters:  12/10/17 190 lb 0.6 oz (86.2 kg)    Ideal Body Weight:  78.2 kg  BMI:  Body mass index is 26.5 kg/m.  Estimated Nutritional Needs:   Kcal:  2100-2400kcal/day   Protein:  95-113g/day   Fluid:  per MD    MS, RD, LDN Pager #- [phone] After Hours Pager: [phone]  

## 2017-12-12 NOTE — Progress Notes (Signed)
Patient had femoral catheter removed by Hoyle Sauer, RN and Lewie Chamber, RN  With pressure dressing held in place. No bleeding or hematoma with assessment 30 minutes after removed. Peripheral IVs removed. Discharge instructions provided and understood by both patient and spouse.Prescription provided to patient.  Belongings placed in bags and patient was discharged to home.VSS  Patient left via wheelchair accompanied by staff and spouse with belongings to spouse's vehicle.

## 2017-12-12 NOTE — Progress Notes (Signed)
Pre HD Tx  

## 2017-12-12 NOTE — Progress Notes (Signed)
[facility] at [place] NAME: [name]    MR#:  [id number]  DATE OF BIRTH:  05-Aug-1961  SUBJECTIVE:  CHIEF COMPLAINT:   Chief Complaint  Patient presents with  . Fever   - No confusion. Alert and oriented. -For dialysis today. Permacath placement scheduled this afternoon.  REVIEW OF SYSTEMS:  Review of Systems  Constitutional: Negative for chills, fever and malaise/fatigue.  HENT: Negative for congestion, ear discharge, hearing loss and nosebleeds.   Eyes: Negative for blurred vision and double vision.  Respiratory: Negative for cough, shortness of breath and wheezing.   Cardiovascular: Negative for chest pain and palpitations.  Gastrointestinal: Negative for abdominal pain, constipation, diarrhea, nausea and vomiting.  Genitourinary: Negative for dysuria.  Musculoskeletal: Positive for myalgias.  Neurological: Negative for dizziness, speech change, focal weakness, seizures and headaches.    DRUG ALLERGIES:   Allergies  Allergen Reactions  . Vancomycin Shortness Of Breath    Eyes watering, SOB, wheezing  . Cefepime Other (See Comments)  . Methotrexate Other (See Comments)    Blood count drops  . Tape     VITALS:  Blood pressure 132/62, pulse 63, temperature 98.8 F (37.1 C), temperature source Oral, resp. rate 18, height 5' 11"  (1.803 m), weight 86.2 kg (190 lb 0.6 oz), SpO2 99 %.  PHYSICAL EXAMINATION:  Physical Exam  GENERAL:  57 y.o.-year-old patient lying in the bed with no acute distress.  EYES: Pupils equal, round, reactive to light and accommodation. No scleral icterus. Extraocular muscles intact.  HEENT: Head atraumatic, normocephalic. Oropharynx and nasopharynx clear.  NECK:  Supple, no jugular venous distention. No thyroid enlargement, no tenderness.  LUNGS: Normal breath sounds bilaterally, no wheezing, rales,rhonchi or crepitation. No use of accessory muscles of respiration. Decreased bibasilar breath  sounds noted CARDIOVASCULAR: S1, S2 normal. No murmurs, rubs, or gallops.  ABDOMEN: Soft, nontender, nondistended. Bowel sounds present. No organomegaly or mass.  EXTREMITIES: No pedal edema, cyanosis, or clubbing. Right groin dialysis catheter noted. No bleeding noted around the dressing. Right BKA NEUROLOGIC: Cranial nerves II through XII are intact. Muscle strength 5/5 in all extremities. Sensation intact. Gait not checked. Global weakness PSYCHIATRIC: The patient is alert and oriented x 3.  SKIN: No obvious rash, lesion, or ulcer.    LABORATORY PANEL:   CBC Recent Labs  Lab 12/11/17 0608  WBC 8.0  HGB 9.0*  HCT 28.5*  PLT 223   ------------------------------------------------------------------------------------------------------------------  Chemistries  Recent Labs  Lab 12/12/17 0542  NA 136  K 4.4  CL 100*  CO2 26  GLUCOSE 126*  BUN 49*  CREATININE 6.98*  CALCIUM 8.8*   ------------------------------------------------------------------------------------------------------------------  Cardiac Enzymes No results for input(s): TROPONINI in the last 168 hours. ------------------------------------------------------------------------------------------------------------------  RADIOLOGY:  No results found.  EKG:   Orders placed or performed during the hospital encounter of 12/04/17  . ED EKG 12-Lead  . ED EKG 12-Lead  . EKG 12-Lead  . EKG 12-Lead    ASSESSMENT AND PLAN:   57 year old African-American male with past medical history significant for end-stage renal disease on hemodialysis, history of DVT, history of intracranial hemorrhage, anemia of chronic disease, diabetes mellitus brought to the hospital secondary to sepsis with fever, leukocytosis and an encephalopathy  1.- Sepsis-secondary to Right groin abscess-currently on daptomycin, doxycycline. -Wound cultures growing group B strep. Discontinued daptomycin. -Daptomycin was started because patient is  allergic to vancomycin. Continue just doxycycline. Low-grade fevers 2 days ago, none today. -Permanent dialysis catheter tip cultures  are negative so far. Blood cultures are negative so far.  -Appreciate surgical evaluation, no intervention necessary. -Currently also has a right groin catheter. Monitor carefully  2. End-stage renal disease-on Monday, Wednesday and Friday dialysis. -Appreciate nephrology consult.  -Will have dialysis today per schedule -We'll need permacath placement, today. For now has a right groin temporary dialysis catheter. Will be removed today  3. Hypertension-continue home medications. On lisinopril, Norvasc and Coreg.  4.-Anemia of chronic disease-on iron supplements.  5. Seizure disorder-continue Keppra  6. DVT prophylaxis- held subcutaneous heparin due to bleeding at the temporary dialysis catheter insertion site-none now  Updated his wife   All the records are reviewed and case discussed with Care Management/Social Workerr. Management plans discussed with the patient, family and they are in agreement.  CODE STATUS: Full Code  TOTAL TIME TAKING CARE OF THIS PATIENT: 37  minutes.   POSSIBLE D/C TODAY, DEPENDING ON CLINICAL CONDITION.   Gladstone Lighter M.D on 12/12/2017 at 12:05 PM  Between 7am to 6pm - Pager - [phone]  After 6pm go to [email or web address] - password [email or web address] [facility]  Office  [phone]  CC: Primary care physician; [name], MD

## 2017-12-13 ENCOUNTER — Telehealth: Payer: Self-pay

## 2017-12-13 ENCOUNTER — Encounter: Payer: Self-pay | Admitting: Vascular Surgery

## 2017-12-13 LAB — GLUCOSE, CAPILLARY: Glucose-Capillary: 84 mg/dL (ref 65–99)

## 2017-12-14 NOTE — Care Management (Signed)
Notified by Gari Crown from [facility] that patient discharged 12/12/17 at 8:45 pm without resumption of care orders.  [name] states they will obtain them from PCP

## 2017-12-14 NOTE — Discharge Summary (Signed)
[facility] at [place] NAME: [name]    MR#:  [id number]  DATE OF BIRTH:  1961-09-13  DATE OF ADMISSION:  12/04/2017   ADMITTING PHYSICIAN: Gorden Harms, MD  DATE OF DISCHARGE: 12/12/2017  8:50 PM  PRIMARY CARE PHYSICIAN: [name], MD   ADMISSION DIAGNOSIS:   Sepsis ([place]) [A41.9] Sepsis, due to unspecified organism ([place]) [A41.9] Fever, unspecified fever cause [R50.9]  DISCHARGE DIAGNOSIS:   Active Problems:   Encephalopathy   Pressure injury of skin   SECONDARY DIAGNOSIS:   Past Medical History:  Diagnosis Date  . Anemia   . Crohn disease ([place])   . Diabetes mellitus without complication ([place])   . DVT of lower extremity (deep venous thrombosis) ([place]) 2016  . Hidradenitis suppurativa   . Hypertension   . ICH (intracerebral hemorrhage) ([place])   . Renal disorder     HOSPITAL COURSE:   57 year old African-American male with past medical history significant for end-stage renal disease on hemodialysis, history of DVT, history of intracranial hemorrhage, anemia of chronic disease, diabetes mellitus brought to the hospital secondary to sepsis with fever, leukocytosis and an encephalopathy  1.- Sepsis-secondary to Right groin abscess-currently on daptomycin, doxycycline. -Wound cultures growing group B strep.  -Daptomycin was started because patient is allergic to vancomycin. Daptomycin was discontinued at discharge.  -Continue just doxycycline. Low-grade fevers 2 days ago, none today. -Permanent dialysis catheter tip cultures are negative so far. Blood cultures are negative so far.  -Appreciate surgical evaluation, no intervention necessary.  2. End-stage renal disease-on Monday, Wednesday and Friday dialysis. -Appreciate nephrology consult.  -had dialysis prior to discharge per schedule -s/p new permacath placement, prior to discharge. -Patient had a temporary right groin catheter placed for dialysis which was  removed prior to discharge  3. Hypertension-continue home medications. On lisinopril, Norvasc and Coreg.  4.-Anemia of chronic disease-on iron supplements.  5. Seizure disorder-continue Keppra   Stable and eager to be discharged    DISCHARGE CONDITIONS:   Guarded  CONSULTS OBTAINED:   Treatment Team:  Anthonette Legato, MD  DRUG ALLERGIES:   Allergies  Allergen Reactions  . Vancomycin Shortness Of Breath    Eyes watering, SOB, wheezing  . Cefepime Other (See Comments)  . Methotrexate Other (See Comments)    Blood count drops  . Tape    DISCHARGE MEDICATIONS:   Allergies as of 12/12/2017      Reactions   Vancomycin Shortness Of Breath   Eyes watering, SOB, wheezing   Cefepime Other (See Comments)   Methotrexate Other (See Comments)   Blood count drops   Tape       Medication List    STOP taking these medications   aspirin EC 81 MG tablet   gabapentin 100 MG capsule Commonly known as:  NEURONTIN   insulin glargine 100 UNIT/ML injection Commonly known as:  LANTUS   nystatin 100000 UNIT/ML suspension Commonly known as:  MYCOSTATIN   sildenafil 100 MG tablet Commonly known as:  VIAGRA     TAKE these medications   Alcohol Swabs Pads Use as directed to check blood sugar three times daily for insulin dependent type 2 diabetes.   amLODipine 10 MG tablet Commonly known as:  NORVASC Take 1 tablet (10 mg total) by mouth daily as needed.   atorvastatin 80 MG tablet Commonly known as:  LIPITOR Take 1 tablet (80 mg total) by mouth daily.   calcitRIOL 0.25 MCG capsule Commonly known as:  ROCALTROL Take 0.5 mcg by mouth daily.   carvedilol 25 MG tablet Commonly known as:  COREG Take 12.5 mg by mouth 2 (two) times daily.   clopidogrel 75 MG tablet Commonly known as:  PLAVIX Take 1 tablet (75 mg total) by mouth daily.   doxycycline 100 MG tablet Commonly known as:  VIBRA-TABS Take 1 tablet (100 mg total) by mouth every 12 (twelve) hours for 5  days. X 5 more days   ferrous sulfate 325 (65 FE) MG tablet Take 1 tablet by mouth daily.   furosemide 80 MG tablet Commonly known as:  LASIX Take 1 tablet (80 mg total) by mouth 2 (two) times daily.   glucose blood test strip Commonly known as:  BAYER CONTOUR TEST Check sugar three times daily for insulin dependent diabetes   HYDROcodone-acetaminophen 5-325 MG tablet Commonly known as:  NORCO/VICODIN Take 1 tablet by mouth every 6 (six) hours as needed for moderate pain or severe pain. What changed:    when to take this  reasons to take this   inFLIXimab 100 MG injection Commonly known as:  REMICADE Inject into the vein every 6 (six) weeks.   insulin aspart 100 UNIT/ML FlexPen Commonly known as:  NOVOLOG FLEXPEN Take 3 units three times a day before each meal- hold if eating <50% of a meal What changed:  additional instructions   Insulin Pen Needle 32G X 6 MM Misc Use to inject insulin via pen up to three times daily for insulin dependent type 2 diabetes.   levETIRAcetam 1000 MG tablet Commonly known as:  KEPPRA Take 1,000 mg by mouth at bedtime.   lisinopril 10 MG tablet Commonly known as:  PRINIVIL,ZESTRIL Take 1 tablet by mouth daily.   mirtazapine 15 MG tablet Commonly known as:  REMERON Take 7.5 mg by mouth at bedtime.   polyethylene glycol packet Commonly known as:  MIRALAX / GLYCOLAX Take 1 packet by mouth as needed.   sevelamer carbonate 800 MG tablet Commonly known as:  RENVELA Take 1 tablet (800 mg total) by mouth 3 (three) times daily.   sodium bicarbonate 650 MG tablet Take 650 mg by mouth 2 (two) times daily.   triamcinolone ointment 0.1 % Commonly known as:  KENALOG Apply 1 application topically as needed.        DISCHARGE INSTRUCTIONS:   1. Vascular follow-up in 2-3 weeks 2. Dialysis follow-up in 2 days as prior scheduled 3. PCP follow-up in 1-2 weeks  DIET:   Renal diet  ACTIVITY:   Activity as tolerated  OXYGEN:   Home  Oxygen: No.  Oxygen Delivery: room air  DISCHARGE LOCATION:   home   If you experience worsening of your admission symptoms, develop shortness of breath, life threatening emergency, suicidal or homicidal thoughts you must seek medical attention immediately by calling 911 or calling your MD immediately  if symptoms less severe.  You Must read complete instructions/literature along with all the possible adverse reactions/side effects for all the Medicines you take and that have been prescribed to you. Take any new Medicines after you have completely understood and accpet all the possible adverse reactions/side effects.   Please note  You were cared for by a hospitalist during your hospital stay. If you have any questions about your discharge medications or the care you received while you were in the hospital after you are discharged, you can call the unit and asked to speak with the hospitalist on call if the hospitalist that took care of you  is not available. Once you are discharged, your primary care physician will handle any further medical issues. Please note that NO REFILLS for any discharge medications will be authorized once you are discharged, as it is imperative that you return to your primary care physician (or establish a relationship with a primary care physician if you do not have one) for your aftercare needs so that they can reassess your need for medications and monitor your lab values.    On the day of Discharge:  VITAL SIGNS:   Blood pressure 133/81, pulse 74, temperature 98.3 F (36.8 C), temperature source Oral, resp. rate 18, height 5' 11"  (1.803 m), weight 86.2 kg (190 lb 0.6 oz), SpO2 100 %.  PHYSICAL EXAMINATION:    GENERAL:  57 y.o.-year-old patient lying in the bed with no acute distress.  EYES: Pupils equal, round, reactive to light and accommodation. No scleral icterus. Extraocular muscles intact.  HEENT: Head atraumatic, normocephalic. Oropharynx and nasopharynx  clear.  NECK:  Supple, no jugular venous distention. No thyroid enlargement, no tenderness.  LUNGS: Normal breath sounds bilaterally, no wheezing, rales,rhonchi or crepitation. No use of accessory muscles of respiration. Decreased bibasilar breath sounds noted CARDIOVASCULAR: S1, S2 normal. No murmurs, rubs, or gallops.  ABDOMEN: Soft, nontender, nondistended. Bowel sounds present. No organomegaly or mass.  EXTREMITIES: No pedal edema, cyanosis, or clubbing. Right groin dialysis catheter noted. No bleeding noted around the dressing. Right BKA NEUROLOGIC: Cranial nerves II through XII are intact. Muscle strength 5/5 in all extremities. Sensation intact. Gait not checked. Global weakness PSYCHIATRIC: The patient is alert and oriented x 3.  SKIN: No obvious rash, lesion, or ulcer.      DATA REVIEW:   CBC Recent Labs  Lab 12/11/17 0608  WBC 8.0  HGB 9.0*  HCT 28.5*  PLT 223    Chemistries  Recent Labs  Lab 12/12/17 0542  NA 136  K 4.4  CL 100*  CO2 26  GLUCOSE 126*  BUN 49*  CREATININE 6.98*  CALCIUM 8.8*     Microbiology Results  Results for orders placed or performed during the hospital encounter of 12/04/17  Blood Culture (routine x 2)     Status: None   Collection Time: 12/04/17 12:42 AM  Result Value Ref Range Status   Specimen Description BLOOD RIGHT FOREARM  Final   Special Requests   Final    BOTTLES DRAWN AEROBIC AND ANAEROBIC Blood Culture adequate volume   Culture   Final    NO GROWTH 5 DAYS Performed at [facility], [address], [place], [place] [address]    Report Status 12/09/2017 FINAL  Final  Blood Culture (routine x 2)     Status: None   Collection Time: 12/04/17 12:42 AM  Result Value Ref Range Status   Specimen Description BLOOD RIGHT WRIST  Final   Special Requests   Final    BOTTLES DRAWN AEROBIC AND ANAEROBIC Blood Culture adequate volume   Culture   Final    NO GROWTH 5 DAYS Performed at [facility], [address] [place], [place] [address]    Report Status 12/09/2017 FINAL  Final  Urine culture     Status: None   Collection Time: 12/04/17  2:40 AM  Result Value Ref Range Status   Specimen Description   Final    URINE, RANDOM Performed at [facility], [address], [place], [place] [address]    Special Requests   Final    NONE Performed  at [facility], [address] [place], [place] [address]    Culture   Final    NO GROWTH Performed at [facility], [place] [address] [place], [place] [address]    Report Status 12/05/2017 FINAL  Final  Respiratory Panel by PCR     Status: None   Collection Time: 12/04/17  3:40 AM  Result Value Ref Range Status   Adenovirus NOT DETECTED NOT DETECTED Final   Coronavirus 229E NOT DETECTED NOT DETECTED Final   Coronavirus HKU1 NOT DETECTED NOT DETECTED Final   Coronavirus NL63 NOT DETECTED NOT DETECTED Final   Coronavirus OC43 NOT DETECTED NOT DETECTED Final   Metapneumovirus NOT DETECTED NOT DETECTED Final   Rhinovirus / Enterovirus NOT DETECTED NOT DETECTED Final   Influenza A NOT DETECTED NOT DETECTED Final   Influenza B NOT DETECTED NOT DETECTED Final   Parainfluenza Virus 1 NOT DETECTED NOT DETECTED Final   Parainfluenza Virus 2 NOT DETECTED NOT DETECTED Final   Parainfluenza Virus 3 NOT DETECTED NOT DETECTED Final   Parainfluenza Virus 4 NOT DETECTED NOT DETECTED Final   Respiratory Syncytial Virus NOT DETECTED NOT DETECTED Final   Bordetella pertussis NOT DETECTED NOT DETECTED Final   Chlamydophila pneumoniae NOT DETECTED NOT DETECTED Final   Mycoplasma pneumoniae NOT DETECTED NOT DETECTED Final    Comment: Performed at [facility], [place] [address] [place], [place] [address]  MRSA PCR Screening     Status: None   Collection Time: 12/04/17  5:57 AM  Result Value Ref Range Status   MRSA by PCR NEGATIVE NEGATIVE Final    Comment:        The GeneXpert MRSA Assay (FDA approved for NASAL specimens only), is  one component of a comprehensive MRSA colonization surveillance program. It is not intended to diagnose MRSA infection nor to guide or monitor treatment for MRSA infections. Performed at [facility], [address] [place], [place] [address]   Aerobic/Anaerobic Culture (surgical/deep wound)     Status: None   Collection Time: 12/05/17 10:11 AM  Result Value Ref Range Status   Specimen Description   Final    GROIN RIGHT Performed at [facility], [place] [address], [place], [place] [address]    Special Requests   Final    Immunocompromised Performed at [facility], [place], [place] [address]    Gram Stain   Final    RARE WBC PRESENT, PREDOMINANTLY PMN MODERATE GRAM NEGATIVE RODS FEW GRAM POSITIVE COCCI IN PAIRS FEW GRAM POSITIVE RODS Performed at [facility], [place] [address], [place], [place] [address]    Culture   Final    RARE GROUP B STREP(S.AGALACTIAE)ISOLATED TESTING AGAINST S. AGALACTIAE NOT ROUTINELY PERFORMED DUE TO PREDICTABILITY OF AMP/PEN/VAN SUSCEPTIBILITY. WITHIN MIXED NORMAL SKIN FLORA MIXED ANAEROBIC FLORA PRESENT.  CALL LAB IF FURTHER IID REQUIRED.    Report Status 12/10/2017 FINAL  Final  Cath Tip Culture     Status: None   Collection Time: 12/06/17  5:41 PM  Result Value Ref Range Status   Specimen Description   Final    CATH TIP Performed at [facility], [address], [place], [place] [address]    Special Requests   Final    NONE Performed at [facility], [address] [place],  [address]    Culture   Final    NO GROWTH 3 DAYS Performed at [facility], [place]  [address], [place], [place] [address]    Report Status 12/10/2017 FINAL  Final    RADIOLOGY:  No results found.   Management plans discussed with the patient, family and they are in agreement.  CODE STATUS:  Code Status History    Date Active Date Inactive Code Status Order ID Comments User Context     12/04/2017 05:11 12/12/2017 23:55 Full Code [id number]  Gorden Harms, MD Inpatient   08/22/2017 12:44 08/22/2017 18:00 Full Code [id number]  Algernon Huxley, MD Inpatient   04/21/2017 18:19 04/25/2017 19:15 Full Code [id number]  Dustin Flock, MD Inpatient   11/06/2015 20:35 11/07/2015 18:18 Full Code [id number]  Aldean Jewett, MD ED      TOTAL TIME TAKING CARE OF THIS PATIENT: 38 minutes.    Lillan Mccreadie M.D on 12/14/2017 at 4:00 PM  Between 7am to 6pm - Pager - [phone]  After 6pm go to [email or web address] - Proofreader  [facility]  Office  [phone]  CC: Primary care physician; [name], MD   Note: This dictation was prepared with Dragon dictation along with smaller phrase technology. Any transcriptional errors that result from this process are unintentional.

## 2017-12-16 NOTE — Telephone Encounter (Signed)
Tried to contact pt on two separate occasions and left messages to CB. Pt has not returned my calls. FYI! -MM

## 2017-12-22 ENCOUNTER — Encounter: Payer: BLUE CROSS/BLUE SHIELD | Attending: Nurse Practitioner | Admitting: Nurse Practitioner

## 2017-12-22 DIAGNOSIS — L97819 Non-pressure chronic ulcer of other part of right lower leg with unspecified severity: Secondary | ICD-10-CM | POA: Insufficient documentation

## 2017-12-22 DIAGNOSIS — I120 Hypertensive chronic kidney disease with stage 5 chronic kidney disease or end stage renal disease: Secondary | ICD-10-CM | POA: Diagnosis not present

## 2017-12-22 DIAGNOSIS — E11622 Type 2 diabetes mellitus with other skin ulcer: Secondary | ICD-10-CM | POA: Insufficient documentation

## 2017-12-22 DIAGNOSIS — Z87891 Personal history of nicotine dependence: Secondary | ICD-10-CM | POA: Insufficient documentation

## 2017-12-22 DIAGNOSIS — I739 Peripheral vascular disease, unspecified: Secondary | ICD-10-CM | POA: Diagnosis not present

## 2017-12-22 DIAGNOSIS — Z8673 Personal history of transient ischemic attack (TIA), and cerebral infarction without residual deficits: Secondary | ICD-10-CM | POA: Diagnosis not present

## 2017-12-22 DIAGNOSIS — E1122 Type 2 diabetes mellitus with diabetic chronic kidney disease: Secondary | ICD-10-CM | POA: Insufficient documentation

## 2017-12-22 DIAGNOSIS — N186 End stage renal disease: Secondary | ICD-10-CM | POA: Diagnosis not present

## 2017-12-22 DIAGNOSIS — L89153 Pressure ulcer of sacral region, stage 3: Secondary | ICD-10-CM | POA: Insufficient documentation

## 2017-12-23 NOTE — Progress Notes (Signed)
Laham, MYCAH FORMICA ([id number]) Visit Report for 12/22/2017 Chief Complaint Document Details Patient Name: [name], [name] Date of Service: 12/22/2017 3:00 PM Medical Record Number: [id number] Patient Account Number: [id number] Date of Birth/Sex: 1961-08-22 (57 y.o. Male) Treating RN: [name] Primary Care Provider: [name] Other Clinician: Referring Provider: [name] Treating Provider/Extender: Cathie Olden in Treatment: 11 Information Obtained from: Patient Chief Complaint He is here for evaluation of sacral pressure ulcer and non-healing wound to right bka stump Electronic Signature(s) Signed: 12/22/2017 3:13:42 PM By: Lawanda Cousins Entered By: Lawanda Cousins on 12/22/2017 15:13:42 Plack, Blair B. ([id number]) -------------------------------------------------------------------------------- Debridement Details Patient Name: [name], [name] Date of Service: 12/22/2017 3:00 PM Medical Record Number: [id number] Patient Account Number: [id number] Date of Birth/Sex: 1961/03/06 (57 y.o. Male) Treating RN: [name] Primary Care Provider: [name] Other Clinician: Referring Provider: [name] Treating Provider/Extender: Cathie Olden in Treatment: 11 Debridement Performed for Wound #2 Right Amputation Site - Below Knee Assessment: Performed By: Physician Lawanda Cousins, NP Debridement: Debridement Pre-procedure Verification/Time Yes - 14:46 Out Taken: Start Time: 14:47 Pain Control: Lidocaine 4% Topical Solution Level: Skin/Subcutaneous Tissue Total Area Debrided (L x W): 0.3 (cm) x 0.3 (cm) = 0.09 (cm) Tissue and other material Viable, Non-Viable, Exudate, Fibrin/Slough, Subcutaneous debrided: Instrument: Curette Bleeding: Minimum Hemostasis Achieved: Pressure End Time: 14:50 Procedural Pain: 0 Post Procedural Pain: 0 Response to Treatment: Procedure was tolerated well Post Debridement Measurements of Total Wound Length: (cm)  0.3 Width: (cm) 0.3 Depth: (cm) 0.7 Volume: (cm) 0.049 Character of Wound/Ulcer Post Debridement: Requires Further Debridement Post Procedure Diagnosis Same as Pre-procedure Electronic Signature(s) Signed: 12/22/2017 4:33:49 PM By: Alric Quan Signed: 12/22/2017 5:22:04 PM By: Lawanda Cousins Entered By: Alric Quan on 12/22/2017 14:50:10 Folsom, Fleet B. ([id number]) -------------------------------------------------------------------------------- HPI Details Patient Name: [name], [name] Date of Service: 12/22/2017 3:00 PM Medical Record Number: [id number] Patient Account Number: [id number] Date of Birth/Sex: August 27, 1961 (57 y.o. Male) Treating RN: [name] Primary Care Provider: [name] Other Clinician: Referring Provider: [name] Treating Provider/Extender: Cathie Olden in Treatment: 11 History of Present Illness Location: Patient has a ulcer to Sacrum Quality: denies pain Severity: denies pain Duration: months, but unable to clearly articulate timeframe Context: ulcer occurred secondary to pressure HPI Description: 10-06-17 he is here for initial evaluation of the sacral ulcer. He is unable to articulate exactly this is a states "months". 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 He is currently in a facility, [facility]. He is unsure if he has a pressure reduction mattress. He admits to sitting the majority of the day and sleeping on his back. We discussed the need for lateral sleeping, which he states he is able to do. He voices no complaints of pain, discomfort, fever, chills, ill feeling. He tolerated debridement. 10/26/17 on evaluation today patient sacral wound appears to be doing excellent. He has been tolerating the dressing changes without complication. He does have some discomfort especially when he has to sit in the chair at dialysis for an extended period of time. With that being  said other than that he tries to stay offloaded as much as possible and I think he has done a very good job in that regard. There does not appear to be any new pressure injury. 11/10/17 He is here in follow up evaluation of a sacral ulcer. He is now home, have been using prisma. He presents with essentially unchanged measurements and increased  in nonviable tissue. Prior to prisma they were using santyl, will transition back to santyl (spouse to call clinic if they do not have santyl at home). We discussed more aggressive offloading as he continues to sleep on his back, improved diet as he tends to eat "junk food". He does use a pressure redistribution cushion while in dialysis. Follow up in two weeks per their preference. 11/24/17-he is here in follow-up evaluation for a sacral ulcer and dates that his vascular surgeon at [facility] wants to "know if we can use the same medicine" on the BKA stump. Patient's bowel did contact the office, they do not have Santyl at home as originally thought. We will initiate Santyl to both wounds today. He states he does "the best I can" as far as repositioning but has inconsistent offloading while sleeping. He does use the chair cushion while in dialysis. Continue with follow-up in 2 weeks per their preference 12/22/17-he is here in follow-up evaluation for a sacral ulcer and right BKA stump ulcer. He was hospitalized at [facility] from 2/17-2/25 for sepsis due to right groin abscess, but her surgical consult note this is hidradenitis and required no surgical intervention. He was treated on the inpatient side with daptomycin. He was discharged on doxycycline which he has completed. he presents today with a healed sacral ulcer. He has a follow-up appointment at [facility] in a couple of weeks regarding the right BKA stump. He will follow-up in 2 weeks per his preference. Electronic Signature(s) Signed: 12/22/2017 3:23:45 PM By: Lawanda Cousins Entered By: Lawanda Cousins on  12/22/2017 15:23:45 Allcock, Toribio Harbour ([id number]) -------------------------------------------------------------------------------- Physician Orders Details Patient Name: Mode, [name] Date of Service: 12/22/2017 3:00 PM Medical Record Number: [id number] Patient Account Number: [id number] Date of Birth/Sex: [birth year] (57 y.o. Male) Treating RN: [name] Primary Care Provider: [name] Other Clinician: Referring Provider: [name] Treating Provider/Extender: Cathie Olden in Treatment: 61 Verbal / Phone Orders: Yes Clinician: Carolyne Fiscal, Debi Read Back and Verified: Yes Diagnosis Coding Wound Cleansing Wound #2 Right Amputation Site - Below Knee o Clean wound with Normal Saline. o May Shower, gently pat wound dry prior to applying new dressing. Anesthetic (add to Medication List) Wound #2 Right Amputation Site - Below Knee o Topical Lidocaine 4% cream applied to wound bed prior to debridement (In Clinic Only). Skin Barriers/Peri-Wound Care Wound #2 Right Amputation Site - Below Knee o Skin Prep Primary Wound Dressing Wound #2 Right Amputation Site - Below Knee o Santyl Ointment Secondary Dressing Wound #2 Right Amputation Site - Below Knee o Boardered Foam Dressing - or telfa island Dressing Change Frequency Wound #2 Right Amputation Site - Below Knee o Change dressing every day. Follow-up Appointments Wound #2 Right Amputation Site - Below Knee o Return Appointment in 2 weeks. Off-Loading Wound #2 Right Amputation Site - Below Knee o Roho cushion for wheelchair o Mattress o Turn and reposition every 2 hours Additional Orders / Instructions Wound #2 Right Amputation Site - Below Knee o Increase protein intake. Medications-please add to medication list. Correia, Xaidyn B. ([id number]) Wound #2 Right Amputation Site - Below Knee o Santyl Enzymatic Ointment Patient Medications Allergies: adhesive, cefepime, methotrexate,  silicon, vancomycin Notifications Medication Indication Start End lidocaine DOSE 1 - topical 4 % cream - 1 cream topical Electronic Signature(s) Signed: 12/22/2017 4:33:49 PM By: Alric Quan Signed: 12/22/2017 5:22:04 PM By: Lawanda Cousins Entered By: Alric Quan on 12/22/2017 14:51:34 Carrol, Toribio Harbour ([id number]) -------------------------------------------------------------------------------- Prescription 12/22/2017 Patient Name: Thiemann, Elvert B. Provider: Rene Kocher,  Cleave Ternes NP Date of Birth: Jul 26, 1961 NPI#: [id number] Sex: Jerilynn Mages DEA#: [id number] Phone #: [phone] License #: Patient Address: [facility] [place], [place] [address] [address], [place], [place] [address] [phone] Allergies adhesive cefepime methotrexate silicon vancomycin Medication Medication: Route: Strength: Form: lidocaine topical 4% cream Class: TOPICAL LOCAL ANESTHETICS Dose: Frequency / Time: Indication: 1 1 cream topical Number of Refills: Number of Units: 0 Generic Substitution: Start Date: End Date: Administered at [place]: Yes Time Administered: Time Discontinued: Note to Pharmacy: Signature(s): Date(s): Tudisco, JAIS DEMIR ([id number]) Electronic Signature(s) Signed: 12/22/2017 4:33:49 PM By: Alric Quan Signed: 12/22/2017 5:22:04 PM By: Lawanda Cousins Entered By: Alric Quan on 12/22/2017 14:51:34 Erisman, Toribio Harbour ([id number]) --------------------------------------------------------------------------------  Problem List Details Patient Name: Westberg, Laquinton B. Date of Service: 12/22/2017 3:00 PM Medical Record Number: [id number] Patient Account Number: [id number] Date of Birth/Sex: 06-11-1961 (57 y.o. Male) Treating RN: [name] Primary Care Provider: [name] Other Clinician: Referring Provider: [name] Treating Provider/Extender:  Cathie Olden in Treatment: 11 Active Problems ICD-10 Encounter Code Description Active Date Diagnosis L89.153 Pressure ulcer of sacral region, stage 3 10/06/2017 Yes Z89.511 Acquired absence of right leg below knee 10/06/2017 Yes N18.6 End stage renal disease 10/06/2017 Yes E11.622 Type 2 diabetes mellitus with other skin ulcer 10/06/2017 Yes S81.801S Unspecified open wound, right lower leg, sequela 11/24/2017 Yes T81.31XS Disruption of external operation (surgical) wound, not elsewhere 11/24/2017 Yes classified, sequela Inactive Problems Resolved Problems Electronic Signature(s) Signed: 12/22/2017 3:13:26 PM By: Lawanda Cousins Entered By: Lawanda Cousins on 12/22/2017 15:13:26 Kochanski, Luisangel B. ([id number]) -------------------------------------------------------------------------------- Progress Note Details Patient Name: Cookston, Selassie B. Date of Service: 12/22/2017 3:00 PM Medical Record Number: [id number] Patient Account Number: [id number] Date of Birth/Sex: 1961-03-09 (57 y.o. Male) Treating RN: [name] Primary Care Provider: [name] Other Clinician: Referring Provider: [name] Treating Provider/Extender: Cathie Olden in Treatment: 11 Subjective Chief Complaint Information obtained from Patient He is here for evaluation of sacral pressure ulcer and non-healing wound to right bka stump History of Present Illness (HPI) The following HPI elements were documented for the patient's wound: Location: Patient has a ulcer to Sacrum Quality: denies pain Severity: denies pain Duration: months, but unable to clearly articulate timeframe Context: ulcer occurred secondary to pressure 10-06-17 he is here for initial evaluation of the sacral ulcer. He is unable to articulate exactly this is a states "months". 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 He is currently in a facility, [facility]. He is unsure if  he has a pressure reduction mattress. He admits to sitting the majority of the day and sleeping on his back. We discussed the need for lateral sleeping, which he states he is able to do. He voices no complaints of pain, discomfort, fever, chills, ill feeling. He tolerated debridement. 10/26/17 on evaluation today patient sacral wound appears to be doing excellent. He has been tolerating the dressing changes without complication. He does have some discomfort especially when he has to sit in the chair at dialysis for an extended period of time. With that being said other than that he tries to stay offloaded as much as possible and I think he has done a very good job in that regard. There does not appear to be any new pressure injury. 11/10/17 He is here in follow up evaluation of a sacral ulcer. He is now home,  have been using prisma. He presents with essentially unchanged measurements and increased in nonviable tissue. Prior to prisma they were using santyl, will transition back to santyl (spouse to call clinic if they do not have santyl at home). We discussed more aggressive offloading as he continues to sleep on his back, improved diet as he tends to eat "junk food". He does use a pressure redistribution cushion while in dialysis. Follow up in two weeks per their preference. 11/24/17-he is here in follow-up evaluation for a sacral ulcer and dates that his vascular surgeon at [facility] wants to "know if we can use the same medicine" on the BKA stump. Patient's bowel did contact the office, they do not have Santyl at home as originally thought. We will initiate Santyl to both wounds today. He states he does "the best I can" as far as repositioning but has inconsistent offloading while sleeping. He does use the chair cushion while in dialysis. Continue with follow-up in 2 weeks per their preference 12/22/17-he is here in follow-up evaluation for a sacral ulcer and right BKA stump ulcer. He was hospitalized at  [facility] regional from 2/17-2/25 for sepsis due to right groin abscess, but her surgical consult note this is hidradenitis and required no surgical intervention. He was treated on the inpatient side with daptomycin. He was discharged on doxycycline which he has completed. he presents today with a healed sacral ulcer. He has a follow-up appointment at [facility] in a couple of weeks regarding the right BKA stump. He will follow-up in 2 weeks per his preference. Patient History Information obtained from Patient. Family History Diabetes - Mother,Siblings, Schaner, Jasiyah B. ([id number]) No family history of Cancer, Heart Disease, Hereditary Spherocytosis, Hypertension, Kidney Disease, Lung Disease, Seizures, Stroke, Thyroid Problems, Tuberculosis. Social History Former smoker - quit 20+ years ago, Marital Status - Married, Alcohol Use - Never, Drug Use - No History, Caffeine Use - Daily. Medical And Surgical History Notes Genitourinary renal disorder - CKD Neurologic CVA - 3 months ago Objective Constitutional Vitals Time Taken: 2:38 PM, Height: 71 in, Weight: 178 lbs, BMI: 24.8, Temperature: 97.7 F, Pulse: 62 bpm, Respiratory Rate: 16 breaths/min, Blood Pressure: 127/73 mmHg. Integumentary (Hair, Skin) Wound #1 status is Healed - Epithelialized. Original cause of wound was Pressure Injury. The wound is located on the Midline Sacrum. The wound measures 0cm length x 0cm width x 0cm depth; 0cm^2 area and 0cm^3 volume. Wound #2 status is Open. Original cause of wound was Surgical Injury. The wound is located on the Right Amputation Site - Below Knee. The wound measures 0.3cm length x 0.3cm width x 0.2cm depth; 0.071cm^2 area and 0.014cm^3 volume. There is Fat Layer (Subcutaneous Tissue) Exposed exposed. There is no tunneling or undermining noted. There is a large amount of serous drainage noted. The wound margin is flat and intact. There is no granulation within the wound bed. There is a large  (67-100%) amount of necrotic tissue within the wound bed including Eschar. The periwound skin appearance did not exhibit: Callus, Crepitus, Excoriation, Induration, Rash, Scarring, Dry/Scaly, Maceration, Atrophie Blanche, Cyanosis, Ecchymosis, Hemosiderin Staining, Mottled, Pallor, Rubor, Erythema. Periwound temperature was noted as No Abnormality. Assessment Active Problems ICD-10 L89.153 - Pressure ulcer of sacral region, stage 3 Z89.511 - Acquired absence of right leg below knee N18.6 - End stage renal disease E11.622 - Type 2 diabetes mellitus with other skin ulcer S81.801S - Unspecified open wound, right lower leg, sequela T81.31XS - Disruption of external operation (surgical) wound, not elsewhere classified, sequela Winfield,  ROMIE TAY ([id number]) Procedures Wound #2 Pre-procedure diagnosis of Wound #2 is an Open Surgical Wound located on the Right Amputation Site - Below Knee . There was a Skin/Subcutaneous Tissue Debridement ([id number]) debridement with total area of 0.09 sq cm performed by Lawanda Cousins, NP. with the following instrument(s): Curette to remove Viable and Non-Viable tissue/material including Exudate, Fibrin/Slough, and Subcutaneous after achieving pain control using Lidocaine 4% Topical Solution. A time out was conducted at 14:46, prior to the start of the procedure. A Minimum amount of bleeding was controlled with Pressure. The procedure was tolerated well with a pain level of 0 throughout and a pain level of 0 following the procedure. Post Debridement Measurements: 0.3cm length x 0.3cm width x 0.7cm depth; 0.049cm^3 volume. Character of Wound/Ulcer Post Debridement requires further debridement. Post procedure Diagnosis Wound #2: Same as Pre-Procedure Plan Wound Cleansing: Wound #2 Right Amputation Site - Below Knee: Clean wound with Normal Saline. May Shower, gently pat wound dry prior to applying new dressing. Anesthetic (add to Medication List): Wound #2  Right Amputation Site - Below Knee: Topical Lidocaine 4% cream applied to wound bed prior to debridement (In Clinic Only). Skin Barriers/Peri-Wound Care: Wound #2 Right Amputation Site - Below Knee: Skin Prep Primary Wound Dressing: Wound #2 Right Amputation Site - Below Knee: Santyl Ointment Secondary Dressing: Wound #2 Right Amputation Site - Below Knee: Boardered Foam Dressing - or telfa island Dressing Change Frequency: Wound #2 Right Amputation Site - Below Knee: Change dressing every day. Follow-up Appointments: Wound #2 Right Amputation Site - Below Knee: Return Appointment in 2 weeks. Off-Loading: Wound #2 Right Amputation Site - Below Knee: Roho cushion for wheelchair Mattress Turn and reposition every 2 hours Additional Orders / Instructions: Wound #2 Right Amputation Site - Below Knee: Increase protein intake. Medications-please add to medication list.: Wound #2 Right Amputation Site - Below Knee: Santyl Enzymatic Ointment The following medication(s) was prescribed: lidocaine topical 4 % cream 1 1 cream topical was prescribed at facility [place], Yancy B. ([id number]) 1. santyl to right bka stump ulcer 2. follow up in two weeks Electronic Signature(s) Signed: 12/22/2017 3:35:04 PM By: Lawanda Cousins Entered By: Lawanda Cousins on 12/22/2017 15:35:03 Pizzuto, Toribio Harbour ([id number]) -------------------------------------------------------------------------------- ROS/PFSH Details Patient Name: Ury, Son B. Date of Service: 12/22/2017 3:00 PM Medical Record Number: [id number] Patient Account Number: [id number] Date of Birth/Sex: 1961-06-27 (57 y.o. Male) Treating RN: [name] Primary Care Provider: [name] Other Clinician: Referring Provider: [name] Treating Provider/Extender: Cathie Olden in Treatment: 11 Information Obtained From Patient Wound History Do you currently have one or more open woundso Yes How many open wounds do you  currently haveo 1 Approximately how long have you had your woundso about 2 months How have you been treating your wound(s) until nowo santyl Has your wound(s) ever healed and then re-openedo No Have you had any lab work done in the past montho No Have you tested positive for an antibiotic resistant organism (MRSA, VRE)o No Have you tested positive for osteomyelitis (bone infection)o No Have you had any tests for circulation on your legso Yes Who ordered the testo PCP Where was the test doneo [facility] Medical History: Negative for: Cataracts; Glaucoma; Optic Neuritis Hematologic/Lymphatic Medical History: Negative for: Anemia; Hemophilia; Human Immunodeficiency Virus; Lymphedema; Sickle Cell Disease Respiratory Medical History: Negative for: Aspiration; Asthma; Chronic Obstructive Pulmonary Disease (COPD); Pneumothorax; Sleep Apnea; Tuberculosis Cardiovascular Medical History: Positive for: Hypertension; Peripheral Arterial Disease Negative for: Angina; Arrhythmia; Congestive Heart Failure; Coronary Artery Disease; Deep  Vein Thrombosis; Hypotension; Myocardial Infarction; Peripheral Venous Disease; Phlebitis; Vasculitis Gastrointestinal Medical History: Positive for: Crohnos Negative for: Cirrhosis ; Colitis; Hepatitis A; Hepatitis B; Hepatitis C Endocrine Medical History: Positive for: Type II Diabetes Ybarbo, Jerek B. ([id number]) Time with diabetes: 20+ years Treated with: Insulin Blood sugar tested every day: Yes Tested : Genitourinary Medical History: Past Medical History Notes: renal disorder - CKD Immunological Medical History: Negative for: Lupus Erythematosus; Raynaudos; Scleroderma Musculoskeletal Medical History: Negative for: Gout; Rheumatoid Arthritis; Osteoarthritis; Osteomyelitis Neurologic Medical History: Negative for: Dementia; Neuropathy; Quadriplegia; Paraplegia; Seizure Disorder Past Medical History Notes: CVA - 3 months ago Oncologic Medical  History: Negative for: Received Chemotherapy; Received Radiation Immunizations Pneumococcal Vaccine: Received Pneumococcal Vaccination: Yes Implantable Devices Family and Social History Cancer: No; Diabetes: Yes - Mother,Siblings; Heart Disease: No; Hereditary Spherocytosis: No; Hypertension: No; Kidney Disease: No; Lung Disease: No; Seizures: No; Stroke: No; Thyroid Problems: No; Tuberculosis: No; Former smoker - quit 20+ years ago; Marital Status - Married; Alcohol Use: Never; Drug Use: No History; Caffeine Use: Daily; Financial Concerns: No; Food, Clothing or Shelter Needs: No; Support System Lacking: No; Transportation Concerns: No; Advanced Directives: No; Patient does not want information on Advanced Directives Physician Affirmation I have reviewed and agree with the above information. Electronic Signature(s) Signed: 12/22/2017 4:33:49 PM By: Alric Quan Signed: 12/22/2017 5:22:04 PM By: Lawanda Cousins Entered By: Lawanda Cousins on 12/22/2017 15:24:00 Strange, Toribio Harbour ([id number]) -------------------------------------------------------------------------------- SuperBill Details Patient Name: Reising, Torie B. Date of Service: 12/22/2017 Medical Record Number: [id number] Patient Account Number: [id number] Date of Birth/Sex: 02/25/1961 (57 y.o. Male) Treating RN: [name] Primary Care Provider: [name] Other Clinician: Referring Provider: [name] Treating Provider/Extender: Cathie Olden in Treatment: 11 Diagnosis Coding ICD-10 Codes Code Description L89.153 Pressure ulcer of sacral region, stage 3 Z89.511 Acquired absence of right leg below knee N18.6 End stage renal disease E11.622 Type 2 diabetes mellitus with other skin ulcer S81.801S Unspecified open wound, right lower leg, sequela T81.31XS Disruption of external operation (surgical) wound, not elsewhere classified, sequela Facility Procedures CPT4 Code: [id number] Description: 76720 - DEB SUBQ  TISSUE 20 SQ CM/< ICD-10 Diagnosis Description S81.801S Unspecified open wound, right lower leg, sequela E11.622 Type 2 diabetes mellitus with other skin ulcer Modifier: Quantity: 1 Physician Procedures CPT4 Code: 9470962 Description: 83662 - WC PHYS SUBQ TISS 20 SQ CM ICD-10 Diagnosis Description S81.801S Unspecified open wound, right lower leg, sequela E11.622 Type 2 diabetes mellitus with other skin ulcer Modifier: Quantity: 1 Electronic Signature(s) Signed: 12/22/2017 3:35:25 PM By: Lawanda Cousins Entered By: Lawanda Cousins on 12/22/2017 15:35:25

## 2017-12-23 NOTE — Progress Notes (Signed)
Kirker, TURHAN CHILL ([id number]) Visit Report for 12/22/2017 Arrival Information Details Patient Name: Brogden, Edenilson B. Date of Service: 12/22/2017 3:00 PM Medical Record Number: [id number] Patient Account Number: [id number] Date of Birth/Sex: [birth year] (57 y.o. Male) Treating RN: Montey Hora Primary Care Tishina Lown: [name] Other Clinician: Referring Charmeka Freeburg: [name] Treating Yamilette Garretson/Extender: Cathie Olden in Treatment: 11 Visit Information History Since Last Visit Added or deleted any medications: No Patient Arrived: Wheel Chair Any new allergies or adverse reactions: No Arrival Time: 14:36 Had a fall or experienced change in No Accompanied By: son activities of daily living that may affect Transfer Assistance: None risk of falls: Patient Identification Verified: Yes Signs or symptoms of abuse/neglect since last visito No Secondary Verification Process Completed: Yes Hospitalized since last visit: No Patient Has Alerts: Yes Has Dressing in Place as Prescribed: Yes Patient Alerts: DMII Pain Present Now: No Electronic Signature(s) Signed: 12/22/2017 5:02:17 PM By: Montey Hora Entered By: Montey Hora on 12/22/2017 14:36:55 Feigenbaum, Toribio Harbour ([id number]) -------------------------------------------------------------------------------- Encounter Discharge Information Details Patient Name: Whitebread, Jaking B. Date of Service: 12/22/2017 3:00 PM Medical Record Number: [id number] Patient Account Number: [id number] Date of Birth/Sex: 04/16/1961 (57 y.o. Male) Treating RN: Roger Shelter Primary Care Falisha Osment: [name] Other Clinician: Referring Jatia Musa: [name] Treating Avishai Reihl/Extender: Cathie Olden in Treatment: 98 Encounter Discharge Information Items Discharge Pain Level: 0 Discharge Condition: Stable Ambulatory Status: Wheelchair Discharge Destination: Home Private Transportation: Auto Accompanied By: son Schedule Follow-up  Appointment: Yes Medication Reconciliation completed and provided No to Patient/Care Charistopher Rumble: Clinical Summary of Care: Electronic Signature(s) Signed: 12/22/2017 4:51:26 PM By: Roger Shelter Entered By: Roger Shelter on 12/22/2017 15:01:57 Maulden, Toribio Harbour ([id number]) -------------------------------------------------------------------------------- Lower Extremity Assessment Details Patient Name: Donson, Osinachi B. Date of Service: 12/22/2017 3:00 PM Medical Record Number: [id number] Patient Account Number: [id number] Date of Birth/Sex: 01-11-1961 (57 y.o. Male) Treating RN: Montey Hora Primary Care Eugenia Eldredge: [name] Other Clinician: Referring Jalene Demo: [name] Treating John Vasconcelos/Extender: Cathie Olden in Treatment: 11 Vascular Assessment Pulses: Posterior Tibial Popliteal Palpable: [Right:Yes] Extremity colors, hair growth, and conditions: Extremity Color: [Right:Normal] Hair Growth on Extremity: [Right:Yes] Temperature of Extremity: [Right:Warm] Capillary Refill: [Right:< 3 seconds] Electronic Signature(s) Signed: 12/22/2017 5:02:17 PM By: Montey Hora Entered By: Montey Hora on 12/22/2017 14:42:55 Pilson, Ivo B. ([id number]) -------------------------------------------------------------------------------- Multi Wound Chart Details Patient Name: Reiley, Macgregor B. Date of Service: 12/22/2017 3:00 PM Medical Record Number: [id number] Patient Account Number: [id number] Date of Birth/Sex: [birth year] (57 y.o. Male) Treating RN: [name] Primary Care Exilda Wilhite: [name] Other Clinician: Referring Maddox Bratcher: [name] Treating Jandel Patriarca/Extender: Cathie Olden in Treatment: 11 Vital Signs Height(in): 81 Pulse(bpm): 61 Weight(lbs): 178 Blood Pressure(mmHg): 127/73 Body Mass Index(BMI): 25 Temperature(F): 97.7 Respiratory Rate 16 (breaths/min): Photos: [1:No Photos] [N/A:N/A] Wound Location: Midline Sacrum Right  Amputation Site - Below N/A Knee Wounding Event: Pressure Injury Surgical Injury N/A Primary Etiology: Pressure Ulcer Open Surgical Wound N/A Comorbid History: N/A Hypertension, Peripheral N/A Arterial Disease, Crohnos, Type II Diabetes Date Acquired: 09/05/2017 06/27/2017 N/A Weeks of Treatment: 11 4 N/A Wound Status: Healed - Epithelialized Open N/A Measurements L x W x D 0x0x0 0.3x0.3x0.2 N/A (cm) Area (cm) : 0 0.071 N/A Volume (cm) : 0 0.014 N/A % Reduction in Area: 100.00% 74.90% N/A % Reduction in Volume: 100.00% 75.40% N/A Classification: Category/Stage III Full Thickness Without N/A Exposed Support Structures Exudate Amount: N/A Large N/A Exudate Type: N/A Serous N/A Exudate Color: N/A amber N/A Wound Margin: N/A Flat and Intact N/A Granulation Amount: N/A None Present (  0%) N/A Necrotic Amount: N/A Large (67-100%) N/A Necrotic Tissue: N/A Eschar N/A Epithelialization: N/A None N/A Debridement: N/A Debridement ([id number]) N/A Pre-procedure N/A 14:46 N/A Verification/Time Out Taken: Prabhu, Isiac B. ([id number]) Pain Control: N/A Lidocaine 4% Topical Solution N/A Tissue Debrided: N/A Fibrin/Slough, Exudates, N/A Subcutaneous Level: N/A Skin/Subcutaneous Tissue N/A Debridement Area (sq cm): N/A 0.09 N/A Instrument: N/A Curette N/A Bleeding: N/A Minimum N/A Hemostasis Achieved: N/A Pressure N/A Procedural Pain: N/A 0 N/A Post Procedural Pain: N/A 0 N/A Debridement Treatment N/A Procedure was tolerated well N/A Response: Post Debridement N/A 0.3x0.3x0.7 N/A Measurements L x W x D (cm) Post Debridement Volume: N/A 0.049 N/A (cm) Periwound Skin Texture: No Abnormalities Noted Excoriation: No N/A Induration: No Callus: No Crepitus: No Rash: No Scarring: No Periwound Skin Moisture: No Abnormalities Noted Maceration: No N/A Dry/Scaly: No Periwound Skin Color: No Abnormalities Noted Atrophie Blanche: No N/A Cyanosis: No Ecchymosis: No Erythema:  No Hemosiderin Staining: No Mottled: No Pallor: No Rubor: No Temperature: N/A No Abnormality N/A Tenderness on Palpation: No No N/A Wound Preparation: N/A Ulcer Cleansing: N/A Rinsed/Irrigated with Saline Topical Anesthetic Applied: Other: lidocaine 4% Procedures Performed: N/A Debridement N/A Treatment Notes Wound #2 (Right Amputation Site - Below Knee) 1. Cleansed with: Clean wound with Normal Saline 2. Anesthetic Topical Lidocaine 4% cream to wound bed prior to debridement 4. Dressing Applied: Santyl Ointment 5. Secondary Dressing Applied Bordered Foam Dressing Electronic Signature(s) Signed: 12/22/2017 3:13:32 PM By: Magnus Ivan, Toribio Harbour ([id number]) Entered By: Lawanda Cousins on 12/22/2017 15:13:31 Mallek, Toribio Harbour ([id number]) -------------------------------------------------------------------------------- [place] Details Patient Name: Semmel, Ermias B. Date of Service: 12/22/2017 3:00 PM Medical Record Number: [id number] Patient Account Number: [id number] Date of Birth/Sex: [birth year] (57 y.o. Male) Treating RN: [name] Primary Care Meilin Brosh: [name] Other Clinician: Referring Sherril Shipman: [name] Treating Marisha Renier/Extender: Cathie Olden in Treatment: 11 Active Inactive ` Abuse / Safety / Falls / Self Care Management Nursing Diagnoses: Impaired physical mobility Goals: Patient will remain injury free related to falls Date Initiated: 10/06/2017 Target Resolution Date: 12/24/2017 Goal Status: Active Interventions: Assess fall risk on admission and as needed Notes: ` Orientation to the Wound Care Program Nursing Diagnoses: Knowledge deficit related to the wound healing center program Goals: Patient/caregiver will verbalize understanding of the [place] Program Date Initiated: 10/06/2017 Target Resolution Date: 12/24/2017 Goal Status: Active Interventions: Provide education on orientation to the  wound center Notes: ` Pressure Nursing Diagnoses: Knowledge deficit related to management of pressures ulcers Goals: Patient will remain free from development of additional pressure ulcers Date Initiated: 11/10/2017 Target Resolution Date: 11/25/2017 Goal Status: Active Interventions: Piscitello, Edword B. ([id number]) Assess: immobility, friction, shearing, incontinence upon admission and as needed Assess offloading mechanisms upon admission and as needed Treatment Activities: Patient referred for pressure reduction/relief devices : 11/10/2017 Notes: ` Wound/Skin Impairment Nursing Diagnoses: Impaired tissue integrity Goals: Ulcer/skin breakdown will heal within 14 weeks Date Initiated: 10/06/2017 Target Resolution Date: 12/24/2017 Goal Status: Active Interventions: Assess patient/caregiver ability to obtain necessary supplies Assess patient/caregiver ability to perform ulcer/skin care regimen upon admission and as needed Assess ulceration(s) every visit Notes: Electronic Signature(s) Signed: 12/22/2017 4:33:49 PM By: Alric Quan Entered By: Alric Quan on 12/22/2017 14:46:06 Mindel, Kaydan B. ([id number]) -------------------------------------------------------------------------------- Pain Assessment Details Patient Name: Arp, Alann B. Date of Service: 12/22/2017 3:00 PM Medical Record Number: [id number] Patient Account Number: [id number] Date of Birth/Sex: Jun 02, 1961 (57 y.o. Male) Treating RN: Montey Hora Primary Care Aaliya Maultsby: [name] Other Clinician: Referring Charle Clear: [name] Treating  Gionna Polak/Extender: Lawanda Cousins Weeks in Treatment: 11 Active Problems Location of Pain Severity and Description of Pain Patient Has Paino No Site Locations Pain Management and Medication Current Pain Management: Notes Topical or injectable lidocaine is offered to patient for acute pain when surgical debridement is performed. If needed, Patient is instructed  to use over the counter pain medication for the following 24-48 hours after debridement. Wound care MDs do not prescribed pain medications. Patient has chronic pain or uncontrolled pain. Patient has been instructed to make an appointment with their Primary Care Physician for pain management. Electronic Signature(s) Signed: 12/22/2017 5:02:17 PM By: Montey Hora Entered By: Montey Hora on 12/22/2017 14:37:31 Perea, Toribio Harbour ([id number]) -------------------------------------------------------------------------------- Patient/Caregiver Education Details Patient Name: Kooi, Kendarius B. Date of Service: 12/22/2017 3:00 PM Medical Record Number: [id number] Patient Account Number: [id number] Date of Birth/Gender: [birth year] (57 y.o. Male) Treating RN: Roger Shelter Primary Care Physician: [name] Other Clinician: Referring Physician: [name] Treating Physician/Extender: Cathie Olden in Treatment: 11 Education Assessment Education Provided To: Patient Education Topics Provided Wound Debridement: Handouts: Wound Debridement Methods: Explain/Verbal Responses: State content correctly Wound/Skin Impairment: Handouts: Caring for Your Ulcer Methods: Explain/Verbal Responses: State content correctly Electronic Signature(s) Signed: 12/22/2017 4:51:26 PM By: Roger Shelter Entered By: Roger Shelter on 12/22/2017 15:02:14 Crafts, Khyri B. ([id number]) -------------------------------------------------------------------------------- Wound Assessment Details Patient Name: Poet, Wang B. Date of Service: 12/22/2017 3:00 PM Medical Record Number: [id number] Patient Account Number: [id number] Date of Birth/Sex: 1961/09/01 (57 y.o. Male) Treating RN: Montey Hora Primary Care Halah Whiteside: [name] Other Clinician: Referring Jewel Venditto: [name] Treating Adilson Grafton/Extender: Lawanda Cousins Weeks in Treatment: 11 Wound Status Wound Number: 1 Primary Etiology:  Pressure Ulcer Wound Location: Midline Sacrum Wound Status: Healed - Epithelialized Wounding Event: Pressure Injury Date Acquired: 09/05/2017 Weeks Of Treatment: 11 Clustered Wound: No Wound Measurements Length: (cm) 0 Width: (cm) 0 Depth: (cm) 0 Area: (cm) 0 Volume: (cm) 0 % Reduction in Area: 100% % Reduction in Volume: 100% Wound Description Classification: Category/Stage III Periwound Skin Texture Texture Color No Abnormalities Noted: No No Abnormalities Noted: No Moisture No Abnormalities Noted: No Electronic Signature(s) Signed: 12/22/2017 5:02:17 PM By: Montey Hora Entered By: Montey Hora on 12/22/2017 14:42:18 Bittel, Tiernan B. ([id number]) -------------------------------------------------------------------------------- Wound Assessment Details Patient Name: Vanderweide, Janos B. Date of Service: 12/22/2017 3:00 PM Medical Record Number: [id number] Patient Account Number: [id number] Date of Birth/Sex: [birth year] (57 y.o. Male) Treating RN: Montey Hora Primary Care Aerin Delany: [name] Other Clinician: Referring Larine Fielding: [name] Treating Temitayo Covalt/Extender: Lawanda Cousins Weeks in Treatment: 11 Wound Status Wound Number: 2 Primary Open Surgical Wound Etiology: Wound Location: Right Amputation Site - Below Knee Wound Open Wounding Event: Surgical Injury Status: Date Acquired: 06/27/2017 Comorbid Hypertension, Peripheral Arterial Disease, Weeks Of Treatment: 4 History: Crohnos, Type II Diabetes Clustered Wound: No Photos Photo Uploaded By: Montey Hora on 12/22/2017 15:00:58 Wound Measurements Length: (cm) 0.3 Width: (cm) 0.3 Depth: (cm) 0.2 Area: (cm) 0.071 Volume: (cm) 0.014 % Reduction in Area: 74.9% % Reduction in Volume: 75.4% Epithelialization: None Tunneling: No Undermining: No Wound Description Full Thickness Without Exposed Support Classification: Structures Wound Margin: Flat and  Intact Exudate Large Amount: Exudate Type: Serous Exudate Color: amber Foul Odor After Cleansing: No Slough/Fibrino Yes Wound Bed Granulation Amount: None Present (0%) Exposed Structure Necrotic Amount: Large (67-100%) Fascia Exposed: No Necrotic Quality: Eschar Fat Layer (Subcutaneous Tissue) Exposed: Yes Tendon Exposed: No Muscle Exposed: No Joint Exposed: No Bone Exposed: No Choung, Izzy B. ([id number]) Periwound Skin Texture Texture Color No Abnormalities Noted:  No No Abnormalities Noted: No Callus: No Atrophie Blanche: No Crepitus: No Cyanosis: No Excoriation: No Ecchymosis: No Induration: No Erythema: No Rash: No Hemosiderin Staining: No Scarring: No Mottled: No Pallor: No Moisture Rubor: No No Abnormalities Noted: No Dry / Scaly: No Temperature / Pain Maceration: No Temperature: No Abnormality Wound Preparation Ulcer Cleansing: Rinsed/Irrigated with Saline Topical Anesthetic Applied: Other: lidocaine 4%, Treatment Notes Wound #2 (Right Amputation Site - Below Knee) 1. Cleansed with: Clean wound with Normal Saline 2. Anesthetic Topical Lidocaine 4% cream to wound bed prior to debridement 4. Dressing Applied: Santyl Ointment 5. Secondary Dressing Applied Bordered Foam Dressing Electronic Signature(s) Signed: 12/22/2017 5:02:17 PM By: Montey Hora Entered By: Montey Hora on 12/22/2017 14:42:38 Jaco, Toribio Harbour ([id number]) -------------------------------------------------------------------------------- [place] Details Patient Name: Nees, Tyrique B. Date of Service: 12/22/2017 3:00 PM Medical Record Number: [id number] Patient Account Number: [id number] Date of Birth/Sex: [birth year] (57 y.o. Male) Treating RN: Montey Hora Primary Care Edyth Glomb: [name] Other Clinician: Referring Brayln Duque: [name] Treating Lowen Barringer/Extender: Cathie Olden in Treatment: 11 Vital Signs Time Taken: 14:38 Temperature (F): 97.7 Height  (in): 71 Pulse (bpm): 62 Weight (lbs): 178 Respiratory Rate (breaths/min): 16 Body Mass Index (BMI): 24.8 Blood Pressure (mmHg): 127/73 Reference Range: 80 - 120 mg / dl Electronic Signature(s) Signed: 12/22/2017 5:02:17 PM By: Montey Hora Entered By: Montey Hora on 12/22/2017 14:39:47

## 2017-12-25 ENCOUNTER — Other Ambulatory Visit: Payer: Self-pay | Admitting: Family Medicine

## 2017-12-25 DIAGNOSIS — E1129 Type 2 diabetes mellitus with other diabetic kidney complication: Secondary | ICD-10-CM

## 2017-12-27 ENCOUNTER — Ambulatory Visit (INDEPENDENT_AMBULATORY_CARE_PROVIDER_SITE_OTHER): Payer: BLUE CROSS/BLUE SHIELD | Admitting: Family Medicine

## 2017-12-27 ENCOUNTER — Encounter: Payer: Self-pay | Admitting: Family Medicine

## 2017-12-27 DIAGNOSIS — A419 Sepsis, unspecified organism: Secondary | ICD-10-CM

## 2017-12-27 NOTE — Progress Notes (Signed)
Patient: [name] Male    DOB: 1961-09-18   57 y.o.   MRN: [id number] Visit Date: 12/27/2017  Today's Provider: [name], MD   Chief Complaint  Patient presents with  . Hospitalization Follow-up   Subjective:    HPI   Follow up Hospitalization  Patient was admitted to [facility] on 12/04/2017 and discharged on 12/12/2017. He was treated for Sepsis and fever. Treatment for this included; wound culture obtained, growing group B strep. Patient was treated with Daptomycin. Upon discharge patient was changed to doxycycline. Patient was advised to follow-up with vascular in 2-3 weeks. Also follow-up with Dialysis in 2 days after discharge. Telephone follow up was done on 12/13/2017. He reports good compliance with treatment. He reports this condition is Improved. Patient reports that he has soreness around his catheter.  Patient has not seen any other doctors or specialist since being discharged from the hospital. Patient did not bring his medications today, and he is unsure of what he takes.   ------------------------------------------------------------------------------------     Allergies  Allergen Reactions  . Vancomycin Shortness Of Breath    Eyes watering, SOB, wheezing  . Cefepime Other (See Comments)  . Methotrexate Other (See Comments)    Blood count drops  . Tape      Current Outpatient Medications:  .  Alcohol Swabs PADS, Use as directed to check blood sugar three times daily for insulin dependent type 2 diabetes., Disp: 100 each, Rfl: 12 .  amLODipine (NORVASC) 10 MG tablet, Take 1 tablet (10 mg total) by mouth daily as needed., Disp: 30 tablet, Rfl: 5 .  atorvastatin (LIPITOR) 80 MG tablet, Take 1 tablet (80 mg total) by mouth daily., Disp: 90 tablet, Rfl: 3 .  calcitRIOL (ROCALTROL) 0.25 MCG capsule, Take 0.5 mcg by mouth daily. , Disp: , Rfl: 1 .  carvedilol (COREG) 25 MG tablet, Take 12.5 mg by mouth 2 (two) times daily. , Disp: , Rfl: 3 .   clopidogrel (PLAVIX) 75 MG tablet, Take 1 tablet (75 mg total) by mouth daily., Disp: 30 tablet, Rfl: 11 .  ferrous sulfate 325 (65 FE) MG tablet, Take 1 tablet by mouth daily., Disp: , Rfl:  .  furosemide (LASIX) 80 MG tablet, Take 1 tablet (80 mg total) by mouth 2 (two) times daily., Disp: 60 tablet, Rfl: 4 .  glucose blood (BAYER CONTOUR TEST) test strip, Check sugar three times daily for insulin dependent diabetes, Disp: 100 each, Rfl: 12 .  HYDROcodone-acetaminophen (NORCO/VICODIN) 5-325 MG tablet, Take 1 tablet by mouth every 6 (six) hours as needed for moderate pain or severe pain., Disp: 20 tablet, Rfl: 0 .  inFLIXimab (REMICADE) 100 MG injection, Inject into the vein every 6 (six) weeks. , Disp: , Rfl:  .  insulin aspart (NOVOLOG FLEXPEN) 100 UNIT/ML FlexPen, Take 3 units three times a day before each meal- hold if eating <50% of a meal, Disp: 1 pen, Rfl: 3 .  Insulin Pen Needle 32G X 6 MM MISC, Use to inject insulin via pen up to three times daily for insulin dependent type 2 diabetes., Disp: 100 each, Rfl: 12 .  levETIRAcetam (KEPPRA) 1000 MG tablet, Take 1,000 mg by mouth at bedtime. , Disp: , Rfl: 0 .  lisinopril (PRINIVIL,ZESTRIL) 10 MG tablet, Take 1 tablet by mouth daily., Disp: , Rfl: 1 .  mirtazapine (REMERON) 15 MG tablet, Take 7.5 mg by mouth at bedtime., Disp: , Rfl:  .  NOVOLOG FLEXPEN 100 UNIT/ML FlexPen,  INJECT 15 UNITS SUBCUTANEOUSLY THREE TIMES DAILY, Disp: 15 mL, Rfl: 3 .  polyethylene glycol (MIRALAX / GLYCOLAX) packet, Take 1 packet by mouth as needed., Disp: , Rfl: 0 .  sevelamer carbonate (RENVELA) 800 MG tablet, Take 1 tablet (800 mg total) by mouth 3 (three) times daily., Disp: 90 tablet, Rfl: 1 .  sodium bicarbonate 650 MG tablet, Take 650 mg by mouth 2 (two) times daily., Disp: , Rfl:  .  triamcinolone ointment (KENALOG) 0.1 %, Apply 1 application topically as needed. , Disp: , Rfl: 0  Review of Systems  Constitutional: Negative for appetite change, chills and  fever.  Respiratory: Negative for chest tightness, shortness of breath and wheezing.   Cardiovascular: Negative for chest pain and palpitations.  Gastrointestinal: Negative for abdominal pain, nausea and vomiting.    Social History   Tobacco Use  . Smoking status: Never Smoker  . Smokeless tobacco: Never Used  Substance Use Topics  . Alcohol use: No   Objective:   BP 116/78 (BP Location: Left Arm, Cuff Size: Large)   Pulse 71   Temp 98.7 F (37.1 C) (Oral)   Resp 18   SpO2 99% Comment: room air Vitals:   12/27/17 1354 12/27/17 1357  BP: 120/80 116/78  Pulse: 71   Resp: 18   Temp: 98.7 F (37.1 C)   TempSrc: Oral   SpO2: 99%      Physical Exam   General Appearance:    Alert, cooperative, no distress  Eyes:    PERRL, conjunctiva/corneas clear, EOM's intact       Lungs:     Clear to auscultation bilaterally, respirations unlabored  Heart:    Regular rate and rhythm  Neurologic:   Awake, alert, oriented x 3. No apparent focal neurological           defect.   Skin:  Slight erythema around catheter sight, no discharge.        Assessment & Plan:     1. Sepsis, due to unspecified organism ([place]) Resolved. Continue routine follow up with dialysis and renal.   Return in about 1 month (around 01/27/2018).       [name], MD  [facility]

## 2018-01-05 ENCOUNTER — Encounter: Payer: BLUE CROSS/BLUE SHIELD | Admitting: Nurse Practitioner

## 2018-01-05 DIAGNOSIS — E11622 Type 2 diabetes mellitus with other skin ulcer: Secondary | ICD-10-CM | POA: Diagnosis not present

## 2018-01-06 ENCOUNTER — Other Ambulatory Visit
Admission: RE | Admit: 2018-01-06 | Discharge: 2018-01-06 | Disposition: A | Discharge status: Home / Self Care | Payer: BLUE CROSS/BLUE SHIELD | Source: Other Acute Inpatient Hospital | Attending: Dermatology | Admitting: Dermatology

## 2018-01-06 DIAGNOSIS — B999 Unspecified infectious disease: Secondary | ICD-10-CM | POA: Diagnosis present

## 2018-01-06 DIAGNOSIS — B952 Enterococcus as the cause of diseases classified elsewhere: Secondary | ICD-10-CM | POA: Diagnosis not present

## 2018-01-08 LAB — AEROBIC CULTURE W GRAM STAIN (SUPERFICIAL SPECIMEN)

## 2018-01-08 LAB — AEROBIC CULTURE  (SUPERFICIAL SPECIMEN)

## 2018-01-12 ENCOUNTER — Encounter (INDEPENDENT_AMBULATORY_CARE_PROVIDER_SITE_OTHER): Payer: BLUE CROSS/BLUE SHIELD

## 2018-01-12 DIAGNOSIS — A419 Sepsis, unspecified organism: Secondary | ICD-10-CM

## 2018-01-12 HISTORY — DX: Sepsis, unspecified organism: A41.9

## 2018-01-19 ENCOUNTER — Encounter: Payer: BLUE CROSS/BLUE SHIELD | Attending: Nurse Practitioner | Admitting: Nurse Practitioner

## 2018-01-19 DIAGNOSIS — I120 Hypertensive chronic kidney disease with stage 5 chronic kidney disease or end stage renal disease: Secondary | ICD-10-CM | POA: Insufficient documentation

## 2018-01-19 DIAGNOSIS — Z992 Dependence on renal dialysis: Secondary | ICD-10-CM | POA: Insufficient documentation

## 2018-01-19 DIAGNOSIS — E1122 Type 2 diabetes mellitus with diabetic chronic kidney disease: Secondary | ICD-10-CM | POA: Insufficient documentation

## 2018-01-19 DIAGNOSIS — Z794 Long term (current) use of insulin: Secondary | ICD-10-CM | POA: Diagnosis not present

## 2018-01-19 DIAGNOSIS — L089 Local infection of the skin and subcutaneous tissue, unspecified: Secondary | ICD-10-CM | POA: Diagnosis not present

## 2018-01-19 DIAGNOSIS — L02214 Cutaneous abscess of groin: Secondary | ICD-10-CM | POA: Insufficient documentation

## 2018-01-19 DIAGNOSIS — N189 Chronic kidney disease, unspecified: Secondary | ICD-10-CM | POA: Diagnosis not present

## 2018-01-19 DIAGNOSIS — E11622 Type 2 diabetes mellitus with other skin ulcer: Secondary | ICD-10-CM | POA: Insufficient documentation

## 2018-01-19 DIAGNOSIS — T8789 Other complications of amputation stump: Secondary | ICD-10-CM | POA: Insufficient documentation

## 2018-01-19 DIAGNOSIS — L97812 Non-pressure chronic ulcer of other part of right lower leg with fat layer exposed: Secondary | ICD-10-CM | POA: Insufficient documentation

## 2018-01-19 DIAGNOSIS — B952 Enterococcus as the cause of diseases classified elsewhere: Secondary | ICD-10-CM | POA: Insufficient documentation

## 2018-01-19 DIAGNOSIS — E1151 Type 2 diabetes mellitus with diabetic peripheral angiopathy without gangrene: Secondary | ICD-10-CM | POA: Diagnosis not present

## 2018-01-19 DIAGNOSIS — Z881 Allergy status to other antibiotic agents status: Secondary | ICD-10-CM | POA: Insufficient documentation

## 2018-01-19 DIAGNOSIS — Z89511 Acquired absence of right leg below knee: Secondary | ICD-10-CM | POA: Insufficient documentation

## 2018-01-19 DIAGNOSIS — L732 Hidradenitis suppurativa: Secondary | ICD-10-CM | POA: Insufficient documentation

## 2018-01-20 NOTE — Progress Notes (Signed)
Wichert, XAIDEN FLEIG ([id number]) Visit Report for 01/19/2018 Chief Complaint Document Details Patient Name: Brady, [name] Date of Service: 01/19/2018 1:00 PM Medical Record Number: [id number] Patient Account Number: [id number] Date of Birth/Sex: 09-Apr-1961 (57 y.o. M) Treating RN: [name] Primary Care Provider: [name] Other Clinician: Referring Provider: [name] Treating Provider/Extender: Cathie Olden in Treatment: 15 Information Obtained from: Patient Chief Complaint He is here for evaluation of right bka stump ulcer Electronic Signature(s) Signed: 01/19/2018 1:43:30 PM By: Lawanda Cousins Entered By: Lawanda Cousins on 01/19/2018 13:43:30 [name] ([id number]) -------------------------------------------------------------------------------- HPI Details Patient Name: [name], [name] Date of Service: 01/19/2018 1:00 PM Medical Record Number: [id number] Patient Account Number: [id number] Date of Birth/Sex: February 07, 1961 (57 y.o. M) Treating RN: [name] Primary Care Provider: [name] Other Clinician: Referring Provider: [name] Treating Provider/Extender: Cathie Olden in Treatment: 15 History of Present Illness Location: Patient has a ulcer to Sacrum Quality: denies pain Severity: denies pain Duration: months, but unable to clearly articulate timeframe Context: ulcer occurred secondary to pressure HPI Description: 10-06-17 he is here for initial evaluation of the sacral ulcer. He is unable to articulate exactly this is a states "months". 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 He is currently in a facility, [facility]. He is unsure if he has a pressure reduction mattress. He admits to sitting the majority of the day and sleeping on his back. We discussed the need for lateral sleeping, which he states he is able to do. He voices no complaints of pain, discomfort, fever,  chills, ill feeling. He tolerated debridement. 10/26/17 on evaluation today patient sacral wound appears to be doing excellent. He has been tolerating the dressing changes without complication. He does have some discomfort especially when he has to sit in the chair at dialysis for an extended period of time. With that being said other than that he tries to stay offloaded as much as possible and I think he has done a very good job in that regard. There does not appear to be any new pressure injury. 11/10/17 He is here in follow up evaluation of a sacral ulcer. He is now home, have been using prisma. He presents with essentially unchanged measurements and increased in nonviable tissue. Prior to prisma they were using santyl, will transition back to santyl (spouse to call clinic if they do not have santyl at home). We discussed more aggressive offloading as he continues to sleep on his back, improved diet as he tends to eat "junk food". He does use a pressure redistribution cushion while in dialysis. Follow up in two weeks per their preference. 11/24/17-he is here in follow-up evaluation for a sacral ulcer and dates that his vascular surgeon at [facility] wants to "know if we can use the same medicine" on the BKA stump. Patient's bowel did contact the office, they do not have Santyl at home as originally thought. We will initiate Santyl to both wounds today. He states he does "the best I can" as far as repositioning but has inconsistent offloading while sleeping. He does use the chair cushion while in dialysis. Continue with follow-up in 2 weeks per their preference 12/22/17-he is here in follow-up evaluation for a sacral ulcer and right BKA stump ulcer. He was hospitalized at [facility] regional from 2/17-2/25 for sepsis due to right groin abscess, but her surgical consult note this is hidradenitis and required no surgical intervention. He was treated  on the inpatient side with daptomycin. He was discharged on  doxycycline which he has completed. he presents today with a healed sacral ulcer. He has a follow-up appointment at [facility] in a couple of weeks regarding the right BKA stump. He will follow-up in 2 weeks per his preference. 01/05/18-He is here in follow up evaluation for right BKA stump ulcer. There is improvement of this ulcer and we will continue with Santyl. He has seen vascular medicine at [facility] regarding this with no changes in treatment plan. He does have a history of hidradenitis and is following Dr. Otho Ket at [facility] dermatology which he saw Monday. He notes increased pain but no change in drainage to his groin. We will culture this today and hold off on antibiotic therapy until sensitivities resulted; he see Dr Otho Ket in three weeks 01/19/18-He is here in follow-up evaluation for right BKA stump. There is improvement, we will change to Prisma. The culture that was obtained from his right groin last week grew Enterococcus faecalis; Monday he was started on Augmentin. He states he is tolerating the Augmentin with no adverse GI effects. He has no appointment later this month with dermatology. He will follow-up here in 2 weeks Electronic Signature(s) Signed: 01/19/2018 1:45:12 PM By: Lawanda Cousins Previous Signature: 01/19/2018 1:44:38 PM Version By: Lawanda Cousins Entered By: Lawanda Cousins on 01/19/2018 13:45:11 [name] ([id number]) [name] ([id number]) -------------------------------------------------------------------------------- Physician Orders Details Patient Name: [name], [name] Date of Service: 01/19/2018 1:00 PM Medical Record Number: [id number] Patient Account Number: [id number] Date of Birth/Sex: 02-12-1961 (57 y.o. M) Treating RN: [name] Primary Care Provider: [name] Other Clinician: Referring Provider: [name] Treating Provider/Extender: Cathie Olden in Treatment: 15 Verbal / Phone Orders: Yes Clinician: Carolyne Fiscal, Debi Read Back and  Verified: Yes Diagnosis Coding Wound Cleansing Wound #2 Right Amputation Site - Below Knee o Clean wound with Normal Saline. o May Shower, gently pat wound dry prior to applying new dressing. Anesthetic (add to Medication List) Wound #2 Right Amputation Site - Below Knee o Topical Lidocaine 4% cream applied to wound bed prior to debridement (In Clinic Only). Skin Barriers/Peri-Wound Care Wound #2 Right Amputation Site - Below Knee o Skin Prep Primary Wound Dressing Wound #2 Right Amputation Site - Below Knee o Silver Collagen - prisma ag place into wound dry and then lightly moisten with normal saline Secondary Dressing Wound #2 Right Amputation Site - Below Knee o Boardered Foam Dressing - or telfa island Dressing Change Frequency Wound #2 Right Amputation Site - Below Knee o Change dressing every day. Follow-up Appointments Wound #2 Right Amputation Site - Below Knee o Return Appointment in 2 weeks. Off-Loading Wound #2 Right Amputation Site - Below Knee o Roho cushion for wheelchair o Mattress o Turn and reposition every 2 hours Additional Orders / Instructions Wound #2 Right Amputation Site - Below Knee o Increase protein intake. Medications-please add to medication list. Brady, [name] ([id number]) Wound #2 Right Amputation Site - Below Knee o Santyl Enzymatic Ointment Patient Medications Allergies: adhesive, cefepime, methotrexate, silicon, vancomycin Notifications Medication Indication Start End lidocaine DOSE 1 - topical 4 % cream - 1 cream topical Electronic Signature(s) Signed: 01/19/2018 4:32:30 PM By: Alric Quan Signed: 01/19/2018 4:47:35 PM By: Lawanda Cousins Entered By: Alric Quan on 01/19/2018 13:38:46 Smejkal, [name] ([id number]) -------------------------------------------------------------------------------- Prescription 01/19/2018 Patient Name: Cornia, Hatcher B. Provider: Lawanda Cousins NP Date of Birth:  1961-02-09 NPI#: [id number] Sex: Jerilynn Mages DEA#: [id number] Phone #: [phone] License #: Patient Address: [facility] [place], [place] [address] [address], [place],  [address] [phone] Allergies adhesive cefepime methotrexate silicon vancomycin Medication Medication: Route: Strength: Form: lidocaine topical 4% cream Class: TOPICAL LOCAL ANESTHETICS Dose: Frequency / Time: Indication: 1 1 cream topical Number of Refills: Number of Units: 0 Generic Substitution: Start Date: End Date: Administered at [place]: Yes Time Administered: Time Discontinued: Note to Pharmacy: Signature(s): Date(s): Kuntzman, JENNIFER HOLLAND ([id number]) Electronic Signature(s) Signed: 01/19/2018 4:32:30 PM By: Alric Quan Signed: 01/19/2018 4:47:35 PM By: Lawanda Cousins Entered By: Alric Quan on 01/19/2018 13:38:47 Kawamoto, [name] B. ([id number]) --------------------------------------------------------------------------------  Problem List Details Patient Name: Nadeem, Kaynen B. Date of Service: 01/19/2018 1:00 PM Medical Record Number: [id number] Patient Account Number: [id number] Date of Birth/Sex: Feb 18, 1961 (57 y.o. M) Treating RN: [name] Primary Care Provider: [name] Other Clinician: Referring Provider: [name] Treating Provider/Extender: Cathie Olden in Treatment: 15 Active Problems ICD-10 Impacting Encounter Code Description Active Date Wound Healing Diagnosis S81.801S Unspecified open wound, right lower leg, sequela 11/24/2017 Yes Z89.511 Acquired absence of right leg below knee 10/06/2017 Yes N18.6 End stage renal disease 10/06/2017 Yes E11.622 Type 2 diabetes mellitus with other skin ulcer 10/06/2017 Yes T81.31XS Disruption of external operation (surgical) wound, not 11/24/2017 Yes elsewhere classified, sequela L73.2  Hidradenitis suppurativa 01/05/2018 Yes Inactive Problems Resolved Problems ICD-10 Code Description Active Date Resolved Date L89.153 Pressure ulcer of sacral region, stage 3 10/06/2017 12/22/2017 Electronic Signature(s) Signed: 01/19/2018 1:43:14 PM By: Lawanda Cousins Entered By: Lawanda Cousins on 01/19/2018 13:43:13 Brady, [name] ([id number]) -------------------------------------------------------------------------------- Progress Note Details Patient Name: Brady, [name] Date of Service: 01/19/2018 1:00 PM Medical Record Number: [id number] Patient Account Number: [id number] Date of Birth/Sex: Mar 17, 1961 (57 y.o. M) Treating RN: [name] Primary Care Provider: [name] Other Clinician: Referring Provider: [name] Treating Provider/Extender: Cathie Olden in Treatment: 15 Subjective Chief Complaint Information obtained from Patient He is here for evaluation of right bka stump ulcer History of Present Illness (HPI) The following HPI elements were documented for the patient's wound: Location: Patient has a ulcer to Sacrum Quality: denies pain Severity: denies pain Duration: months, but unable to clearly articulate timeframe Context: ulcer occurred secondary to pressure 10-06-17 he is here for initial evaluation of the sacral ulcer. He is unable to articulate exactly this is a states "months". 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 He is currently in a facility, [facility]. He is unsure if he has a pressure reduction mattress. He admits to sitting the majority of the day and sleeping on his back. We discussed the need for lateral sleeping, which he states he is able to do. He voices no complaints of pain, discomfort, fever, chills, ill feeling. He tolerated debridement. 10/26/17 on evaluation today patient sacral wound appears to be doing excellent. He has been tolerating the dressing changes without  complication. He does have some discomfort especially when he has to sit in the chair at dialysis for an extended period of time. With that being said other than that he tries to stay offloaded as much as possible and I think he has done a very good job in that regard. There does not appear to be any new pressure injury. 11/10/17 He is here in follow up evaluation of a sacral ulcer. He is now home, have been using prisma. He presents with essentially unchanged measurements and  increased in nonviable tissue. Prior to prisma they were using santyl, will transition back to santyl (spouse to call clinic if they do not have santyl at home). We discussed more aggressive offloading as he continues to sleep on his back, improved diet as he tends to eat "junk food". He does use a pressure redistribution cushion while in dialysis. Follow up in two weeks per their preference. 11/24/17-he is here in follow-up evaluation for a sacral ulcer and dates that his vascular surgeon at [facility] wants to "know if we can use the same medicine" on the BKA stump. Patient's bowel did contact the office, they do not have Santyl at home as originally thought. We will initiate Santyl to both wounds today. He states he does "the best I can" as far as repositioning but has inconsistent offloading while sleeping. He does use the chair cushion while in dialysis. Continue with follow-up in 2 weeks per their preference 12/22/17-he is here in follow-up evaluation for a sacral ulcer and right BKA stump ulcer. He was hospitalized at [facility] regional from 2/17-2/25 for sepsis due to right groin abscess, but her surgical consult note this is hidradenitis and required no surgical intervention. He was treated on the inpatient side with daptomycin. He was discharged on doxycycline which he has completed. he presents today with a healed sacral ulcer. He has a follow-up appointment at [facility] in a couple of weeks regarding the right BKA stump. He will  follow-up in 2 weeks per his preference. 01/05/18-He is here in follow up evaluation for right BKA stump ulcer. There is improvement of this ulcer and we will continue with Santyl. He has seen vascular medicine at [facility] regarding this with no changes in treatment plan. He does have a history of hidradenitis and is following Dr. Otho Ket at [facility] dermatology which he saw Monday. He notes increased pain but no change in drainage to his groin. We will culture this today and hold off on antibiotic therapy until sensitivities resulted; he see Dr Otho Ket in three weeks 01/19/18-He is here in follow-up evaluation for right BKA stump. There is improvement, we will change to Prisma. The culture that was obtained from his right groin last week grew Enterococcus faecalis; Monday he was started on Augmentin. He states Brady, [name] ([id number]) he is tolerating the Augmentin with no adverse GI effects. He has no appointment later this month with dermatology. He will follow-up here in 2 weeks Patient History Information obtained from Patient. Family History Diabetes - Mother,Siblings, No family history of Cancer, Heart Disease, Hereditary Spherocytosis, Hypertension, Kidney Disease, Lung Disease, Seizures, Stroke, Thyroid Problems, Tuberculosis. Social History Former smoker - quit 20+ years ago, Marital Status - Married, Alcohol Use - Never, Drug Use - No History, Caffeine Use - Daily. Medical And Surgical History Notes Genitourinary renal disorder - CKD Neurologic CVA - 3 months ago Objective Constitutional Vitals Time Taken: 1:34 AM, Height: 71 in, Weight: 178 lbs, BMI: 24.8, Temperature: 98.3 F, Pulse: 60 bpm, Respiratory Rate: 18 breaths/min, Blood Pressure: 140/78 mmHg. Integumentary (Hair, Skin) Wound #2 status is Open. Original cause of wound was Surgical Injury. The wound is located on the Right Amputation Site - Below Knee. The wound measures 0.2cm length x 0.2cm width x 0.4cm depth; 0.031cm^2  area and 0.013cm^3 volume. There is Fat Layer (Subcutaneous Tissue) Exposed exposed. There is no tunneling or undermining noted. There is a medium amount of serous drainage noted. The wound margin is flat and intact. There is no granulation within the wound  bed. There is a large (67-100%) amount of necrotic tissue within the wound bed including Eschar. The periwound skin appearance did not exhibit: Callus, Crepitus, Excoriation, Induration, Rash, Scarring, Dry/Scaly, Maceration, Atrophie Blanche, Cyanosis, Ecchymosis, Hemosiderin Staining, Mottled, Pallor, Rubor, Erythema. Periwound temperature was noted as No Abnormality. Assessment Active Problems ICD-10 I95.188C - Unspecified open wound, right lower leg, sequela Z89.511 - Acquired absence of right leg below knee Brady, [name] ([id number]) N18.6 - End stage renal disease E11.622 - Type 2 diabetes mellitus with other skin ulcer T81.31XS - Disruption of external operation (surgical) wound, not elsewhere classified, sequela L73.2 - Hidradenitis suppurativa Plan Wound Cleansing: Wound #2 Right Amputation Site - Below Knee: Clean wound with Normal Saline. May Shower, gently pat wound dry prior to applying new dressing. Anesthetic (add to Medication List): Wound #2 Right Amputation Site - Below Knee: Topical Lidocaine 4% cream applied to wound bed prior to debridement (In Clinic Only). Skin Barriers/Peri-Wound Care: Wound #2 Right Amputation Site - Below Knee: Skin Prep Primary Wound Dressing: Wound #2 Right Amputation Site - Below Knee: Silver Collagen - prisma ag place into wound dry and then lightly moisten with normal saline Secondary Dressing: Wound #2 Right Amputation Site - Below Knee: Boardered Foam Dressing - or telfa island Dressing Change Frequency: Wound #2 Right Amputation Site - Below Knee: Change dressing every day. Follow-up Appointments: Wound #2 Right Amputation Site - Below Knee: Return Appointment in 2  weeks. Off-Loading: Wound #2 Right Amputation Site - Below Knee: Roho cushion for wheelchair Mattress Turn and reposition every 2 hours Additional Orders / Instructions: Wound #2 Right Amputation Site - Below Knee: Increase protein intake. Medications-please add to medication list.: Wound #2 Right Amputation Site - Below Knee: Santyl Enzymatic Ointment The following medication(s) was prescribed: lidocaine topical 4 % cream 1 1 cream topical was prescribed at facility 1. right bka stump-prisma 2. continue augmentin 3. follow up in two weeks Brady, [name] ([id number]) Electronic Signature(s) Signed: 01/19/2018 1:46:11 PM By: Lawanda Cousins Entered By: Lawanda Cousins on 01/19/2018 13:46:11 Ingram, [name] ([id number]) -------------------------------------------------------------------------------- ROS/PFSH Details Patient Name: Brady, [name] Date of Service: 01/19/2018 1:00 PM Medical Record Number: [id number] Patient Account Number: [id number] Date of Birth/Sex: [birth year] (57 y.o. M) Treating RN: [name] Primary Care Provider: [name] Other Clinician: Referring Provider: [name] Treating Provider/Extender: Cathie Olden in Treatment: 15 Information Obtained From Patient Wound History Do you currently have one or more open woundso Yes How many open wounds do you currently haveo 1 Approximately how long have you had your woundso about 2 months How have you been treating your wound(s) until nowo santyl Has your wound(s) ever healed and then re-openedo No Have you had any lab work done in the past montho No Have you tested positive for an antibiotic resistant organism (MRSA, VRE)o No Have you tested positive for osteomyelitis (bone infection)o No Have you had any tests for circulation on your legso Yes Who ordered the testo PCP Where was the test doneo [facility] Medical History: Negative for: Cataracts; Glaucoma; Optic  Neuritis Hematologic/Lymphatic Medical History: Negative for: Anemia; Hemophilia; Human Immunodeficiency Virus; Lymphedema; Sickle Cell Disease Respiratory Medical History: Negative for: Aspiration; Asthma; Chronic Obstructive Pulmonary Disease (COPD); Pneumothorax; Sleep Apnea; Tuberculosis Cardiovascular Medical History: Positive for: Hypertension; Peripheral Arterial Disease Negative for: Angina; Arrhythmia; Congestive Heart Failure; Coronary Artery Disease; Deep Vein Thrombosis; Hypotension; Myocardial Infarction; Peripheral Venous Disease; Phlebitis; Vasculitis Gastrointestinal Medical History: Positive for: Crohnos Negative for: Cirrhosis ; Colitis; Hepatitis A; Hepatitis  B; Hepatitis C Endocrine Medical History: Positive for: Type II Diabetes Brady, [name] ([id number]) Time with diabetes: 20+ years Treated with: Insulin Blood sugar tested every day: Yes Tested : Genitourinary Medical History: Past Medical History Notes: renal disorder - CKD Immunological Medical History: Negative for: Lupus Erythematosus; Raynaudos; Scleroderma Musculoskeletal Medical History: Negative for: Gout; Rheumatoid Arthritis; Osteoarthritis; Osteomyelitis Neurologic Medical History: Negative for: Dementia; Neuropathy; Quadriplegia; Paraplegia; Seizure Disorder Past Medical History Notes: CVA - 3 months ago Oncologic Medical History: Negative for: Received Chemotherapy; Received Radiation Immunizations Pneumococcal Vaccine: Received Pneumococcal Vaccination: Yes Implantable Devices Family and Social History Cancer: No; Diabetes: Yes - Mother,Siblings; Heart Disease: No; Hereditary Spherocytosis: No; Hypertension: No; Kidney Disease: No; Lung Disease: No; Seizures: No; Stroke: No; Thyroid Problems: No; Tuberculosis: No; Former smoker - quit 20+ years ago; Marital Status - Married; Alcohol Use: Never; Drug Use: No History; Caffeine Use: Daily; Financial Concerns: No; Food, Clothing  or Shelter Needs: No; Support System Lacking: No; Transportation Concerns: No; Advanced Directives: No; Patient does not want information on Advanced Directives Physician Affirmation I have reviewed and agree with the above information. Electronic Signature(s) Signed: 01/19/2018 4:32:30 PM By: Alric Quan Signed: 01/19/2018 4:47:35 PM By: Lawanda Cousins Entered By: Lawanda Cousins on 01/19/2018 13:45:24 Mcmackin, [name] ([id number]) -------------------------------------------------------------------------------- [place] Details Patient Name: Treadwell, Axiel B. Date of Service: 01/19/2018 Medical Record Number: [id number] Patient Account Number: [id number] Date of Birth/Sex: 10-16-1961 (57 y.o. M) Treating RN: [name] Primary Care Provider: [name] Other Clinician: Referring Provider: [name] Treating Provider/Extender: Cathie Olden in Treatment: 15 Diagnosis Coding ICD-10 Codes Code Description A76.811X Unspecified open wound, right lower leg, sequela Z89.511 Acquired absence of right leg below knee N18.6 End stage renal disease E11.622 Type 2 diabetes mellitus with other skin ulcer T81.31XS Disruption of external operation (surgical) wound, not elsewhere classified, sequela L73.2 Hidradenitis suppurativa Physician Procedures CPT4 Code: [id number] Description: 59741 - WC PHYS LEVEL 3 - EST PT ICD-10 Diagnosis Description S81.801S Unspecified open wound, right lower leg, sequela Z89.511 Acquired absence of right leg below knee Modifier: Quantity: 1 Electronic Signature(s) Signed: 01/19/2018 1:46:27 PM By: Lawanda Cousins Entered By: Lawanda Cousins on 01/19/2018 13:46:27

## 2018-01-20 NOTE — Progress Notes (Signed)
[name], [name] ([id number]) Visit Report for 01/19/2018 Arrival Information Details Patient Name: [name], [name] Date of Service: 01/19/2018 1:00 PM Medical Record Number: [id number] Patient Account Number: [id number] Date of Birth/Sex: 1961/01/01 (57 y.o. M) Treating RN: Roger Shelter Primary Care Jaden Abreu: [name] Other Clinician: Referring Zeric Baranowski: [name] Treating Visente Kirker/Extender: Cathie Olden in Treatment: 15 Visit Information History Since Last Visit All ordered tests and consults were completed: No Patient Arrived: Wheel Chair Added or deleted any medications: No Arrival Time: 13:03 Any new allergies or adverse reactions: No Accompanied By: son Had a fall or experienced change in No Transfer Assistance: None activities of daily living that may affect Patient Identification Verified: Yes risk of falls: Secondary Verification Process Completed: Yes Signs or symptoms of abuse/neglect since last visito No Patient Has Alerts: Yes Hospitalized since last visit: No Patient Alerts: DMII Implantable device outside of the clinic excluding No cellular tissue based products placed in the center since last visit: Pain Present Now: No Electronic Signature(s) Signed: 01/19/2018 2:53:14 PM By: Roger Shelter Entered By: Roger Shelter on 01/19/2018 13:04:45 Szydlowski, Toribio Harbour ([id number]) -------------------------------------------------------------------------------- Encounter Discharge Information Details Patient Name: [name], [name] Date of Service: 01/19/2018 1:00 PM Medical Record Number: [id number] Patient Account Number: [id number] Date of Birth/Sex: 19-Sep-1961 (57 y.o. M) Treating RN: [name] Primary Care Dmetrius Ambs: [name] Other Clinician: Referring Anntoinette Haefele: [name] Treating Rodman Recupero/Extender: Cathie Olden in Treatment: 15 Encounter Discharge Information Items Discharge Pain Level: 0 Discharge Condition:  Stable Ambulatory Status: Wheelchair Discharge Destination: Home Transportation: Private Auto Accompanied By: son Schedule Follow-up Appointment: Yes Medication Reconciliation completed and No provided to Patient/Care Hovanes Hymas: Provided on Clinical Summary of Care: 01/19/2018 Form Type Recipient Paper Patient MM Electronic Signature(s) Signed: 01/19/2018 2:53:14 PM By: Roger Shelter Entered By: Roger Shelter on 01/19/2018 13:59:48 Palin, Toribio Harbour ([id number]) -------------------------------------------------------------------------------- Lower Extremity Assessment Details Patient Name: [name], [name] Date of Service: 01/19/2018 1:00 PM Medical Record Number: [id number] Patient Account Number: [id number] Date of Birth/Sex: [birth year] (57 y.o. M) Treating RN: Roger Shelter Primary Care Nasri Boakye: [name] Other Clinician: Referring Maleia Weems: [name] Treating Carrin Vannostrand/Extender: Lawanda Cousins Weeks in Treatment: 15 Electronic Signature(s) Signed: 01/19/2018 2:53:14 PM By: Roger Shelter Entered By: Roger Shelter on 01/19/2018 13:11:22 Oleson, Dione BMarland Kitchen ([id number]) -------------------------------------------------------------------------------- Multi Wound Chart Details Patient Name: [name], [name] Date of Service: 01/19/2018 1:00 PM Medical Record Number: [id number] Patient Account Number: [id number] Date of Birth/Sex: 1960/12/18 (57 y.o. M) Treating RN: [name] Primary Care Tahra Hitzeman: [name] Other Clinician: Referring Mohamud Mrozek: [name] Treating Thad Osoria/Extender: Cathie Olden in Treatment: 15 Vital Signs Height(in): 71 Pulse(bpm): 60 Weight(lbs): 178 Blood Pressure(mmHg): 140/78 Body Mass Index(BMI): 25 Temperature(F): 98.3 Respiratory Rate 18 (breaths/min): Photos: [2:No Photos] [N/A:N/A] Wound Location: [2:Right Amputation Site - Below Knee] [N/A:N/A] Wounding Event: [2:Surgical Injury] [N/A:N/A] Primary  Etiology: [2:Open Surgical Wound] [N/A:N/A] Comorbid History: [2:Hypertension, Peripheral Arterial Disease, Crohnos, Type II Diabetes] [N/A:N/A] Date Acquired: [2:06/27/2017] [N/A:N/A] Weeks of Treatment: [2:8] [N/A:N/A] Wound Status: [2:Open] [N/A:N/A] Measurements L x W x D [2:0.2x0.2x0.4] [N/A:N/A] (cm) Area (cm) : [2:0.031] [N/A:N/A] Volume (cm) : [2:0.013] [N/A:N/A] % Reduction in Area: [2:89.00%] [N/A:N/A] % Reduction in Volume: [2:77.20%] [N/A:N/A] Classification: [2:Full Thickness Without Exposed Support Structures] [N/A:N/A] Exudate Amount: [2:Medium] [N/A:N/A] Exudate Type: [2:Serous] [N/A:N/A] Exudate Color: [2:amber] [N/A:N/A] Wound Margin: [2:Flat and Intact] [N/A:N/A] Granulation Amount: [2:None Present (0%)] [N/A:N/A] Necrotic Amount: [2:Large (67-100%)] [N/A:N/A] Necrotic Tissue: [2:Eschar] [N/A:N/A] Exposed Structures: [2:Fat Layer (Subcutaneous Tissue) Exposed: Yes Fascia: No Tendon: No Muscle: No Joint:  No Bone: No] [N/A:N/A] Epithelialization: [2:None] [N/A:N/A] Periwound Skin Texture: [2:Excoriation: No Induration: No Callus: No] [N/A:N/A] Crepitus: No Rash: No Scarring: No Periwound Skin Moisture: Maceration: No N/A N/A Dry/Scaly: No Periwound Skin Color: Atrophie Blanche: No N/A N/A Cyanosis: No Ecchymosis: No Erythema: No Hemosiderin Staining: No Mottled: No Pallor: No Rubor: No Temperature: No Abnormality N/A N/A Tenderness on Palpation: No N/A N/A Wound Preparation: Ulcer Cleansing: N/A N/A Rinsed/Irrigated with Saline Topical Anesthetic Applied: Other: lidocaine 4% Treatment Notes Electronic Signature(s) Signed: 01/19/2018 1:43:19 PM By: Lawanda Cousins Entered By: Lawanda Cousins on 01/19/2018 13:43:19 Hurlbut, Toribio Harbour ([id number]) -------------------------------------------------------------------------------- [place] Details Patient Name: [name], [name] Date of Service: 01/19/2018 1:00 PM Medical Record Number:  [id number] Patient Account Number: [id number] Date of Birth/Sex: [birth year] (57 y.o. M) Treating RN: [name] Primary Care Kamori Barbier: [name] Other Clinician: Referring Christino Mcglinchey: [name] Treating Annielee Jemmott/Extender: Cathie Olden in Treatment: 15 Active Inactive ` Abuse / Safety / Falls / Self Care Management Nursing Diagnoses: Impaired physical mobility Goals: Patient will remain injury free related to falls Date Initiated: 10/06/2017 Target Resolution Date: 12/24/2017 Goal Status: Active Interventions: Assess fall risk on admission and as needed Notes: ` Orientation to the Wound Care Program Nursing Diagnoses: Knowledge deficit related to the wound healing center program Goals: Patient/caregiver will verbalize understanding of the [facility] Date Initiated: 10/06/2017 Target Resolution Date: 12/24/2017 Goal Status: Active Interventions: Provide education on orientation to the wound center Notes: ` Pressure Nursing Diagnoses: Knowledge deficit related to management of pressures ulcers Goals: Patient will remain free from development of additional pressure ulcers Date Initiated: 11/10/2017 Target Resolution Date: 11/25/2017 Goal Status: Active Interventions: Vaughn, Rehaan B. ([id number]) Assess: immobility, friction, shearing, incontinence upon admission and as needed Assess offloading mechanisms upon admission and as needed Treatment Activities: Patient referred for pressure reduction/relief devices : 11/10/2017 Notes: ` Wound/Skin Impairment Nursing Diagnoses: Impaired tissue integrity Goals: Ulcer/skin breakdown will heal within 14 weeks Date Initiated: 10/06/2017 Target Resolution Date: 12/24/2017 Goal Status: Active Interventions: Assess patient/caregiver ability to obtain necessary supplies Assess patient/caregiver ability to perform ulcer/skin care regimen upon admission and as needed Assess ulceration(s) every  visit Notes: Electronic Signature(s) Signed: 01/19/2018 4:32:30 PM By: Alric Quan Entered By: Alric Quan on 01/19/2018 13:35:46 [name], [name] ([id number]) -------------------------------------------------------------------------------- Pain Assessment Details Patient Name: [name], [name] Date of Service: 01/19/2018 1:00 PM Medical Record Number: [id number] Patient Account Number: [id number] Date of Birth/Sex: 09/02/1961 (57 y.o. M) Treating RN: Roger Shelter Primary Care Kc Summerson: [name] Other Clinician: Referring Jaanai Salemi: [name] Treating Yuri Flener/Extender: Cathie Olden in Treatment: 15 Active Problems Location of Pain Severity and Description of Pain Patient Has Paino No Site Locations Pain Management and Medication Current Pain Management: Electronic Signature(s) Signed: 01/19/2018 2:53:14 PM By: Roger Shelter Entered By: Roger Shelter on 01/19/2018 13:04:53 Pettinato, Toribio Harbour ([id number]) -------------------------------------------------------------------------------- Patient/Caregiver Education Details Patient Name: [name], [name] Date of Service: 01/19/2018 1:00 PM Medical Record Number: [id number] Patient Account Number: [id number] Date of Birth/Gender: Feb 23, 1961 (57 y.o. M) Treating RN: Roger Shelter Primary Care Physician: [name] Other Clinician: Referring Physician: [name] Treating Physician/Extender: Cathie Olden in Treatment: 15 Education Assessment Education Provided To: Patient Education Topics Provided Wound Debridement: Handouts: Wound Debridement Methods: Explain/Verbal Responses: State content correctly Wound/Skin Impairment: Handouts: Caring for Your Ulcer Methods: Explain/Verbal Responses: State content correctly Electronic Signature(s) Signed: 01/19/2018 2:53:14 PM By: Roger Shelter Entered By: Roger Shelter on 01/19/2018 14:00:03 [name], [name]  ([id number]) -------------------------------------------------------------------------------- Wound Assessment Details  Patient Name: [name], [name] Date of Service: 01/19/2018 1:00 PM Medical Record Number: [id number] Patient Account Number: [id number] Date of Birth/Sex: 1961/07/01 (57 y.o. M) Treating RN: Roger Shelter Primary Care Avangeline Stockburger: [name] Other Clinician: Referring Markell Schrier: [name] Treating Enes Wegener/Extender: Cathie Olden in Treatment: 15 Wound Status Wound Number: 2 Primary Open Surgical Wound Etiology: Wound Location: Right Amputation Site - Below Knee Wound Open Wounding Event: Surgical Injury Status: Date Acquired: 06/27/2017 Comorbid Hypertension, Peripheral Arterial Disease, Weeks Of Treatment: 8 History: Crohnos, Type II Diabetes Clustered Wound: No Photos Photo Uploaded By: Roger Shelter on 01/19/2018 14:56:54 Wound Measurements Length: (cm) 0.2 Width: (cm) 0.2 Depth: (cm) 0.4 Area: (cm) 0.031 Volume: (cm) 0.013 % Reduction in Area: 89% % Reduction in Volume: 77.2% Epithelialization: None Tunneling: No Undermining: No Wound Description Full Thickness Without Exposed Support Classification: Structures Wound Margin: Flat and Intact Exudate Medium Amount: Exudate Type: Serous Exudate Color: amber Foul Odor After Cleansing: No Slough/Fibrino Yes Wound Bed Granulation Amount: None Present (0%) Exposed Structure Necrotic Amount: Large (67-100%) Fascia Exposed: No Necrotic Quality: Eschar Fat Layer (Subcutaneous Tissue) Exposed: Yes Tendon Exposed: No Muscle Exposed: No Joint Exposed: No Bone Exposed: No [name], [name] ([id number]) Periwound Skin Texture Texture Color No Abnormalities Noted: No No Abnormalities Noted: No Callus: No Atrophie Blanche: No Crepitus: No Cyanosis: No Excoriation: No Ecchymosis: No Induration: No Erythema: No Rash: No Hemosiderin Staining: No Scarring: No Mottled:  No Pallor: No Moisture Rubor: No No Abnormalities Noted: No Dry / Scaly: No Temperature / Pain Maceration: No Temperature: No Abnormality Wound Preparation Ulcer Cleansing: Rinsed/Irrigated with Saline Topical Anesthetic Applied: Other: lidocaine 4%, Treatment Notes Wound #2 (Right Amputation Site - Below Knee) 1. Cleansed with: Clean wound with Normal Saline 2. Anesthetic Topical Lidocaine 4% cream to wound bed prior to debridement 4. Dressing Applied: Prisma Ag 5. Secondary Dressing Applied Bordered Foam Dressing Electronic Signature(s) Signed: 01/19/2018 2:53:14 PM By: Roger Shelter Entered By: Roger Shelter on 01/19/2018 13:10:20 [name], Toribio Harbour ([id number]) -------------------------------------------------------------------------------- Vitals Details Patient Name: [name], [name] Date of Service: 01/19/2018 1:00 PM Medical Record Number: [id number] Patient Account Number: [id number] Date of Birth/Sex: 1961-07-12 (57 y.o. M) Treating RN: Roger Shelter Primary Care Adama Ferber: [name] Other Clinician: Referring Fields Oros: [name] Treating Geanine Vandekamp/Extender: Cathie Olden in Treatment: 15 Vital Signs Time Taken: 01:34 Temperature (F): 98.3 Height (in): 71 Pulse (bpm): 60 Weight (lbs): 178 Respiratory Rate (breaths/min): 18 Body Mass Index (BMI): 24.8 Blood Pressure (mmHg): 140/78 Reference Range: 80 - 120 mg / dl Electronic Signature(s) Signed: 01/19/2018 2:53:14 PM By: Roger Shelter Entered By: Roger Shelter on 01/19/2018 13:06:50

## 2018-01-31 ENCOUNTER — Encounter: Payer: Self-pay | Admitting: Family Medicine

## 2018-01-31 ENCOUNTER — Ambulatory Visit (INDEPENDENT_AMBULATORY_CARE_PROVIDER_SITE_OTHER): Payer: BLUE CROSS/BLUE SHIELD | Admitting: Family Medicine

## 2018-01-31 VITALS — BP 140/70 | HR 82 | Temp 99.0°F | Resp 16

## 2018-01-31 DIAGNOSIS — Z992 Dependence on renal dialysis: Secondary | ICD-10-CM | POA: Diagnosis not present

## 2018-01-31 DIAGNOSIS — Z23 Encounter for immunization: Secondary | ICD-10-CM

## 2018-01-31 DIAGNOSIS — E1129 Type 2 diabetes mellitus with other diabetic kidney complication: Secondary | ICD-10-CM

## 2018-01-31 DIAGNOSIS — Z794 Long term (current) use of insulin: Secondary | ICD-10-CM

## 2018-01-31 DIAGNOSIS — J301 Allergic rhinitis due to pollen: Secondary | ICD-10-CM | POA: Diagnosis not present

## 2018-01-31 DIAGNOSIS — N186 End stage renal disease: Secondary | ICD-10-CM | POA: Diagnosis not present

## 2018-01-31 LAB — POCT GLYCOSYLATED HEMOGLOBIN (HGB A1C)
Est. average glucose Bld gHb Est-mCnc: 154
Hemoglobin A1C: 7

## 2018-01-31 NOTE — Patient Instructions (Signed)
   You can take OTC loratadine (Claritin) or fexofenadine (Allegra for allergies   You should also use nasal SALINE spray to rinse out pollens   Do not take any medication that has a decongestant in it

## 2018-01-31 NOTE — Progress Notes (Signed)
Patient: [name] Male    DOB: 1961/08/02   57 y.o.   MRN: [id number] Visit Date: 01/31/2018  Today's Provider: [name], MD   Chief Complaint  Patient presents with  . Diabetes   Subjective:    HPI   Diabetes Mellitus Type II, Follow-up:   Lab Results  Component Value Date   HGBA1C 5.9 10/20/2017   HGBA1C 10.1 (H) 04/21/2017   HGBA1C 6.5 (H) 11/18/2016   Last seen for diabetes 3 months ago.  Management since then includes; no changes. He reports good compliance with treatment. He is not having side effects.  Current symptoms include none and have been stable. Home blood sugar records: fasting range: 130's  Episodes of hypoglycemia? no   Current Insulin Regimen: Lantus 12-17 units at night, and Novolog sliding scale 1-2 times a day Most Recent Eye Exam: <1 year ago Weight trend: unable to weight (due to wheel chair) Prior visit with dietician: no Current diet: in general, an "unhealthy" diet Current exercise: none  ------------------------------------------------------------------------  Sepsis, due to unspecified organism ([place]) From 12/27/2017-Resolved. Continue routine follow up with dialysis and renal. He has been following up at wound clinic    Allergies  Allergen Reactions  . Vancomycin Shortness Of Breath    Eyes watering, SOB, wheezing  . Cefepime Other (See Comments)  . Methotrexate Other (See Comments)    Blood count drops  . Tape      Current Outpatient Medications:  .  insulin glargine (LANTUS) 100 UNIT/ML injection, Inject into the skin at bedtime. Inject as directed by physician, Disp: , Rfl:  .  Alcohol Swabs PADS, Use as directed to check blood sugar three times daily for insulin dependent type 2 diabetes., Disp: 100 each, Rfl: 12 .  amLODipine (NORVASC) 10 MG tablet, Take 1 tablet (10 mg total) by mouth daily as needed., Disp: 30 tablet, Rfl: 5 .  atorvastatin (LIPITOR) 80 MG tablet, Take 1 tablet (80 mg total) by mouth  daily., Disp: 90 tablet, Rfl: 3 .  calcitRIOL (ROCALTROL) 0.25 MCG capsule, Take 0.5 mcg by mouth daily. , Disp: , Rfl: 1 .  carvedilol (COREG) 25 MG tablet, Take 12.5 mg by mouth 2 (two) times daily. , Disp: , Rfl: 3 .  clopidogrel (PLAVIX) 75 MG tablet, Take 1 tablet (75 mg total) by mouth daily., Disp: 30 tablet, Rfl: 11 .  ferrous sulfate 325 (65 FE) MG tablet, Take 1 tablet by mouth daily., Disp: , Rfl:  .  furosemide (LASIX) 80 MG tablet, Take 1 tablet (80 mg total) by mouth 2 (two) times daily., Disp: 60 tablet, Rfl: 4 .  glucose blood (BAYER CONTOUR TEST) test strip, Check sugar three times daily for insulin dependent diabetes, Disp: 100 each, Rfl: 12 .  HYDROcodone-acetaminophen (NORCO/VICODIN) 5-325 MG tablet, Take 1 tablet by mouth every 6 (six) hours as needed for moderate pain or severe pain., Disp: 20 tablet, Rfl: 0 .  inFLIXimab (REMICADE) 100 MG injection, Inject into the vein every 6 (six) weeks. , Disp: , Rfl:  .  Insulin Pen Needle 32G X 6 MM MISC, Use to inject insulin via pen up to three times daily for insulin dependent type 2 diabetes., Disp: 100 each, Rfl: 12 .  levETIRAcetam (KEPPRA) 1000 MG tablet, Take 1,000 mg by mouth at bedtime. , Disp: , Rfl: 0 .  lisinopril (PRINIVIL,ZESTRIL) 10 MG tablet, Take 1 tablet by mouth daily., Disp: , Rfl: 1 .  mirtazapine (REMERON) 15 MG  tablet, Take 7.5 mg by mouth at bedtime., Disp: , Rfl:  .  NOVOLOG FLEXPEN 100 UNIT/ML FlexPen, INJECT 15 UNITS SUBCUTANEOUSLY THREE TIMES DAILY, Disp: 15 mL, Rfl: 3 .  polyethylene glycol (MIRALAX / GLYCOLAX) packet, Take 1 packet by mouth as needed., Disp: , Rfl: 0 .  sevelamer carbonate (RENVELA) 800 MG tablet, Take 1 tablet (800 mg total) by mouth 3 (three) times daily., Disp: 90 tablet, Rfl: 1 .  sodium bicarbonate 650 MG tablet, Take 650 mg by mouth 2 (two) times daily., Disp: , Rfl:  .  triamcinolone ointment (KENALOG) 0.1 %, Apply 1 application topically as needed. , Disp: , Rfl: 0  Review of  Systems  Constitutional: Negative for appetite change, chills and fever.  Respiratory: Negative for chest tightness, shortness of breath and wheezing.   Cardiovascular: Negative for chest pain and palpitations.  Gastrointestinal: Negative for abdominal pain, nausea and vomiting.    Social History   Tobacco Use  . Smoking status: Never Smoker  . Smokeless tobacco: Never Used  Substance Use Topics  . Alcohol use: No   Objective:   BP 140/70 (BP Location: Right Arm, Patient Position: Sitting, Cuff Size: Normal)   Pulse 82   Temp 99 F (37.2 C) (Oral)   Resp 16     Physical Exam   General Appearance:    Alert, cooperative, no distress  Eyes:    PERRL, conjunctiva/corneas clear, EOM's intact       Lungs:     Clear to auscultation bilaterally, respirations unlabored  Heart:    Regular rate and rhythm  Neurologic:   Awake, alert, oriented x 3. No apparent focal neurological           defect.        Results for orders placed or performed in visit on 01/31/18  POCT HgB A1C  Result Value Ref Range   Hemoglobin A1C 7.0    Est. average glucose Bld gHb Est-mCnc 154        Assessment & Plan:     1. Type 2 diabetes mellitus with other diabetic kidney complication, with long-term current use of insulin ([place]) Fairly well controlled on current regiment which we will continue.  - POCT HgB A1C - insulin glargine (LANTUS) 100 UNIT/ML injection; Inject into the skin at bedtime. Inject as directed by physician  2. End stage renal failure on dialysis ([facility]) Stable, continue regular follow up with renal.   3. Allergic rhinitis due to pollen, unspecified seasonality He asked about what he could take for allergies with the heavy pollen this year. Advised he could take OTC loratadine or fexofenadine, but not to take any decongestants.   4. Need for tetanus, diphtheria, and acellular pertussis (Tdap) vaccine  - Tdap vaccine greater than or equal to 7yo IM  Return in about 4 months  (around 06/02/2018).       [name], MD  [facility]

## 2018-02-02 ENCOUNTER — Encounter: Payer: BLUE CROSS/BLUE SHIELD | Admitting: Nurse Practitioner

## 2018-02-02 DIAGNOSIS — E11622 Type 2 diabetes mellitus with other skin ulcer: Secondary | ICD-10-CM | POA: Diagnosis not present

## 2018-02-07 NOTE — Progress Notes (Signed)
[name] ([id number]) Visit Report for 02/02/2018 Chief Complaint Document Details Patient Name: Brady, [name] Date of Service: 02/02/2018 8:45 AM Medical Record Number: [id number] Patient Account Number: [id number] Date of Birth/Sex: December 29, 1960 (57 y.o. M) Treating RN: [name] Primary Care Provider: [name] Other Clinician: Referring Provider: [name] Treating Provider/Extender: Cathie Olden in Treatment: 17 Information Obtained from: Patient Chief Complaint He is here for evaluation of right bka stump ulcer Electronic Signature(s) Signed: 02/02/2018 9:38:55 AM By: Lawanda Cousins Entered By: Lawanda Cousins on 02/02/2018 09:38:54 [name] ([id number]) -------------------------------------------------------------------------------- HPI Details Patient Name: Brady, [name] Date of Service: 02/02/2018 8:45 AM Medical Record Number: [id number] Patient Account Number: [id number] Date of Birth/Sex: 12/04/1960 (58 y.o. M) Treating RN: [name] Primary Care Provider: [name] Other Clinician: Referring Provider: [name] Treating Provider/Extender: Cathie Olden in Treatment: 17 History of Present Illness Location: Patient has a ulcer to Sacrum Quality: denies pain Severity: denies pain Duration: months, but unable to clearly articulate timeframe Context: ulcer occurred secondary to pressure HPI Description: 10-06-17 he is here for initial evaluation of the sacral ulcer. He is unable to articulate exactly this is a states "months". 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 He is currently in a facility, [facility]. He is unsure if he has a pressure reduction mattress. He admits to sitting the majority of the day and sleeping on his back. We discussed the need for lateral sleeping, which he states he is able to do. He voices no complaints of pain, discomfort, fever,  chills, ill feeling. He tolerated debridement. 10/26/17 on evaluation today patient sacral wound appears to be doing excellent. He has been tolerating the dressing changes without complication. He does have some discomfort especially when he has to sit in the chair at dialysis for an extended period of time. With that being said other than that he tries to stay offloaded as much as possible and I think he has done a very good job in that regard. There does not appear to be any new pressure injury. 11/10/17 He is here in follow up evaluation of a sacral ulcer. He is now home, have been using prisma. He presents with essentially unchanged measurements and increased in nonviable tissue. Prior to prisma they were using santyl, will transition back to santyl (spouse to call clinic if they do not have santyl at home). We discussed more aggressive offloading as he continues to sleep on his back, improved diet as he tends to eat "junk food". He does use a pressure redistribution cushion while in dialysis. Follow up in two weeks per their preference. 11/24/17-he is here in follow-up evaluation for a sacral ulcer and dates that his vascular surgeon at [facility] wants to "know if we can use the same medicine" on the BKA stump. Patient's bowel did contact the office, they do not have Santyl at home as originally thought. We will initiate Santyl to both wounds today. He states he does "the best I can" as far as repositioning but has inconsistent offloading while sleeping. He does use the chair cushion while in dialysis. Continue with follow-up in 2 weeks per their preference 12/22/17-he is here in follow-up evaluation for a sacral ulcer and right BKA stump ulcer. He was hospitalized at [facility] from 2/17-2/25 for sepsis due to right groin abscess, but her surgical consult note this is hidradenitis and required no surgical intervention. He was treated  on the inpatient side with daptomycin. He was discharged on  doxycycline which he has completed. he presents today with a healed sacral ulcer. He has a follow-up appointment at [facility] in a couple of weeks regarding the right BKA stump. He will follow-up in 2 weeks per his preference. 01/05/18-He is here in follow up evaluation for right BKA stump ulcer. There is improvement of this ulcer and we will continue with Santyl. He has seen vascular medicine at [facility] regarding this with no changes in treatment plan. He does have a history of hidradenitis and is following Dr. Otho Ket at [facility] dermatology which he saw Monday. He notes increased pain but no change in drainage to his groin. We will culture this today and hold off on antibiotic therapy until sensitivities resulted; he see Dr Otho Ket in three weeks 01/19/18-He is here in follow-up evaluation for right BKA stump. There is improvement, we will change to Prisma. The culture that was obtained from his right groin last week grew Enterococcus faecalis; Monday he was started on Augmentin. He states he is tolerating the Augmentin with no adverse GI effects. He has no appointment later this month with dermatology. He will follow-up here in 2 weeks 02/02/18-He is here in preparation for right BKA stump ulcer. There is minimal improvement but we will continue with Prisma. He does have a follow-up appointment with vascular medicine this afternoon. His appointment with dermatology as in June not later this month as he originally thought. He does note improvement in drainage from his right groin/hidradenitis. He will follow-up in 2 weeks per his preference. [name] ([id number]) Electronic Signature(s) Signed: 02/02/2018 9:39:58 AM By: Lawanda Cousins Entered By: Lawanda Cousins on 02/02/2018 09:39:58 [name] ([id number]) -------------------------------------------------------------------------------- Physician Orders Details Patient Name: [name], [name] Date of Service: 02/02/2018 8:45 AM Medical Record Number:  [id number] Patient Account Number: [id number] Date of Birth/Sex: 12/22/1960 (57 y.o. M) Treating RN: [name] Primary Care Provider: [name] Other Clinician: Referring Provider: [name] Treating Provider/Extender: Cathie Olden in Treatment: 43 Verbal / Phone Orders: Yes Clinician: Carolyne Fiscal, Debi Read Back and Verified: Yes Diagnosis Coding Wound Cleansing Wound #2 Right Amputation Site - Below Knee o Clean wound with Normal Saline. o May Shower, gently pat wound dry prior to applying new dressing. Anesthetic (add to Medication List) Wound #2 Right Amputation Site - Below Knee o Topical Lidocaine 4% cream applied to wound bed prior to debridement (In Clinic Only). Skin Barriers/Peri-Wound Care Wound #2 Right Amputation Site - Below Knee o Skin Prep Primary Wound Dressing Wound #2 Right Amputation Site - Below Knee o Silver Collagen - prisma ag place into wound dry and then lightly moisten with normal saline Secondary Dressing Wound #2 Right Amputation Site - Below Knee o Boardered Foam Dressing - or telfa island Dressing Change Frequency Wound #2 Right Amputation Site - Below Knee o Change dressing every other day. Follow-up Appointments Wound #2 Right Amputation Site - Below Knee o Return Appointment in 2 weeks. Off-Loading Wound #2 Right Amputation Site - Below Knee o Roho cushion for wheelchair o Mattress o Turn and reposition every 2 hours Additional Orders / Instructions Wound #2 Right Amputation Site - Below Knee o Increase protein intake. Patient Medications Brady, [name] ([id number]) Allergies: adhesive, cefepime, methotrexate, silicon, vancomycin Notifications Medication Indication Start End lidocaine DOSE 1 - topical 4 % cream - 1 cream topical Electronic Signature(s) Signed: 02/02/2018 3:41:30 PM By: Lawanda Cousins Signed: 02/02/2018 5:13:37 PM By: Alric Quan Entered By:  Alric Quan on  02/02/2018 86:76:72 Brady, [name] DOLLINS ([id number]) -------------------------------------------------------------------------------- Prescription 02/02/2018 Patient Name: Brady, [name] Provider: Lawanda Cousins NP Date of Birth: 11/04/1960 NPI#: [id number] Sex: Jerilynn Mages DEA#: [id number] Phone #: [phone] License #: Patient Address: [facility] [place], [place] [address] [address], [place], [place] [address] [phone] Allergies adhesive cefepime methotrexate silicon vancomycin Medication Medication: Route: Strength: Form: lidocaine topical 4% cream Class: TOPICAL LOCAL ANESTHETICS Dose: Frequency / Time: Indication: 1 1 cream topical Number of Refills: Number of Units: 0 Generic Substitution: Start Date: End Date: Administered at [place]: Yes Time Administered: Time Discontinued: Note to Pharmacy: Signature(s): Date(s): Quinonez, ERICH KOCHAN ([id number]) Electronic Signature(s) Signed: 02/02/2018 3:41:30 PM By: Lawanda Cousins Signed: 02/02/2018 5:13:37 PM By: Alric Quan Entered By: Alric Quan on 02/02/2018 09:27:24 Bucher, [name] ([id number]) --------------------------------------------------------------------------------  Problem List Details Patient Name: Spohr, Xaviar B. Date of Service: 02/02/2018 8:45 AM Medical Record Number: [id number] Patient Account Number: [id number] Date of Birth/Sex: Sep 25, 1961 (57 y.o. M) Treating RN: [name] Primary Care Provider: [name] Other Clinician: Referring Provider: [name] Treating Provider/Extender: Cathie Olden in Treatment: 17 Active Problems ICD-10 Impacting Encounter Code Description Active Date Wound Healing Diagnosis S81.801S Unspecified open wound, right lower leg, sequela 11/24/2017 Yes Z89.511 Acquired absence of right leg below knee 10/06/2017  Yes N18.6 End stage renal disease 10/06/2017 Yes E11.622 Type 2 diabetes mellitus with other skin ulcer 10/06/2017 Yes T81.31XS Disruption of external operation (surgical) wound, not 11/24/2017 Yes elsewhere classified, sequela L73.2 Hidradenitis suppurativa 01/05/2018 Yes Inactive Problems Resolved Problems ICD-10 Code Description Active Date Resolved Date L89.153 Pressure ulcer of sacral region, stage 3 10/06/2017 12/22/2017 Electronic Signature(s) Signed: 02/02/2018 9:38:37 AM By: Lawanda Cousins Entered By: Lawanda Cousins on 02/02/2018 09:38:36 Wible, [name] ([id number]) -------------------------------------------------------------------------------- Progress Note Details Patient Name: Brady, [name] Date of Service: 02/02/2018 8:45 AM Medical Record Number: [id number] Patient Account Number: [id number] Date of Birth/Sex: [birth year] (57 y.o. M) Treating RN: [name] Primary Care Provider: [name] Other Clinician: Referring Provider: [name] Treating Provider/Extender: Cathie Olden in Treatment: 17 Subjective Chief Complaint Information obtained from Patient He is here for evaluation of right bka stump ulcer History of Present Illness (HPI) The following HPI elements were documented for the patient's wound: Location: Patient has a ulcer to Sacrum Quality: denies pain Severity: denies pain Duration: months, but unable to clearly articulate timeframe Context: ulcer occurred secondary to pressure 10-06-17 he is here for initial evaluation of the sacral ulcer. He is unable to articulate exactly this is a states "months". 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 He is currently in a facility, [facility]. He is unsure if he has a pressure reduction mattress. He admits to sitting the majority of the day and sleeping on his back. We discussed the need for lateral sleeping, which he states he is able to do.  He voices no complaints of pain, discomfort, fever, chills, ill feeling. He tolerated debridement. 10/26/17 on evaluation today patient sacral wound appears to be doing excellent. He has been tolerating the dressing changes without complication. He does have some discomfort especially when he has to sit in the chair at dialysis for an extended period of time. With that being said other than that he tries to stay offloaded as much as possible and I think he has done a very good job  in that regard. There does not appear to be any new pressure injury. 11/10/17 He is here in follow up evaluation of a sacral ulcer. He is now home, have been using prisma. He presents with essentially unchanged measurements and increased in nonviable tissue. Prior to prisma they were using santyl, will transition back to santyl (spouse to call clinic if they do not have santyl at home). We discussed more aggressive offloading as he continues to sleep on his back, improved diet as he tends to eat "junk food". He does use a pressure redistribution cushion while in dialysis. Follow up in two weeks per their preference. 11/24/17-he is here in follow-up evaluation for a sacral ulcer and dates that his vascular surgeon at [facility] wants to "know if we can use the same medicine" on the BKA stump. Patient's bowel did contact the office, they do not have Santyl at home as originally thought. We will initiate Santyl to both wounds today. He states he does "the best I can" as far as repositioning but has inconsistent offloading while sleeping. He does use the chair cushion while in dialysis. Continue with follow-up in 2 weeks per their preference 12/22/17-he is here in follow-up evaluation for a sacral ulcer and right BKA stump ulcer. He was hospitalized at [facility] from 2/17-2/25 for sepsis due to right groin abscess, but her surgical consult note this is hidradenitis and required no surgical intervention. He was treated on the  inpatient side with daptomycin. He was discharged on doxycycline which he has completed. he presents today with a healed sacral ulcer. He has a follow-up appointment at [facility] in a couple of weeks regarding the right BKA stump. He will follow-up in 2 weeks per his preference. 01/05/18-He is here in follow up evaluation for right BKA stump ulcer. There is improvement of this ulcer and we will continue with Santyl. He has seen vascular medicine at [facility] regarding this with no changes in treatment plan. He does have a history of hidradenitis and is following Dr. Otho Ket at [facility] dermatology which he saw Monday. He notes increased pain but no change in drainage to his groin. We will culture this today and hold off on antibiotic therapy until sensitivities resulted; he see Dr Otho Ket in three weeks 01/19/18-He is here in follow-up evaluation for right BKA stump. There is improvement, we will change to Prisma. The culture that was obtained from his right groin last week grew Enterococcus faecalis; Monday he was started on Augmentin. He states Brady, [name] ([id number]) he is tolerating the Augmentin with no adverse GI effects. He has no appointment later this month with dermatology. He will follow-up here in 2 weeks 02/02/18-He is here in preparation for right BKA stump ulcer. There is minimal improvement but we will continue with Prisma. He does have a follow-up appointment with vascular medicine this afternoon. His appointment with dermatology as in June not later this month as he originally thought. He does note improvement in drainage from his right groin/hidradenitis. He will follow-up in 2 weeks per his preference. Patient History Information obtained from Patient. Family History Diabetes - Mother,Siblings, No family history of Cancer, Heart Disease, Hereditary Spherocytosis, Hypertension, Kidney Disease, Lung Disease, Seizures, Stroke, Thyroid Problems, Tuberculosis. Social History Former smoker - quit  20+ years ago, Marital Status - Married, Alcohol Use - Never, Drug Use - No History, Caffeine Use - Daily. Medical And Surgical History Notes Genitourinary renal disorder - CKD Neurologic CVA - 3 months ago Objective Constitutional Vitals Time Taken:  9:17 AM, Height: 71 in, Weight: 178 lbs, BMI: 24.8, Temperature: 98.4 F, Pulse: 53 bpm, Respiratory Rate: 18 breaths/min, Blood Pressure: 149/75 mmHg. Integumentary (Hair, Skin) Wound #2 status is Open. Original cause of wound was Surgical Injury. The wound is located on the Right Amputation Site - Below Knee. The wound measures 0.2cm length x 0.2cm width x 0.3cm depth; 0.031cm^2 area and 0.009cm^3 volume. There is Fat Layer (Subcutaneous Tissue) Exposed exposed. There is no tunneling or undermining noted. There is a medium amount of serous drainage noted. The wound margin is flat and intact. There is large (67-100%) red granulation within the wound bed. There is a small (1-33%) amount of necrotic tissue within the wound bed including Adherent Slough. The periwound skin appearance did not exhibit: Callus, Crepitus, Excoriation, Induration, Rash, Scarring, Dry/Scaly, Maceration, Atrophie Blanche, Cyanosis, Ecchymosis, Hemosiderin Staining, Mottled, Pallor, Rubor, Erythema. Periwound temperature was noted as No Abnormality. Assessment Brady, [name] ([id number]) Active Problems ICD-10 S81.801S - Unspecified open wound, right lower leg, sequela Z89.511 - Acquired absence of right leg below knee N18.6 - End stage renal disease E11.622 - Type 2 diabetes mellitus with other skin ulcer T81.31XS - Disruption of external operation (surgical) wound, not elsewhere classified, sequela L73.2 - Hidradenitis suppurativa Plan Wound Cleansing: Wound #2 Right Amputation Site - Below Knee: Clean wound with Normal Saline. May Shower, gently pat wound dry prior to applying new dressing. Anesthetic (add to Medication List): Wound #2 Right Amputation  Site - Below Knee: Topical Lidocaine 4% cream applied to wound bed prior to debridement (In Clinic Only). Skin Barriers/Peri-Wound Care: Wound #2 Right Amputation Site - Below Knee: Skin Prep Primary Wound Dressing: Wound #2 Right Amputation Site - Below Knee: Silver Collagen - prisma ag place into wound dry and then lightly moisten with normal saline Secondary Dressing: Wound #2 Right Amputation Site - Below Knee: Boardered Foam Dressing - or telfa island Dressing Change Frequency: Wound #2 Right Amputation Site - Below Knee: Change dressing every other day. Follow-up Appointments: Wound #2 Right Amputation Site - Below Knee: Return Appointment in 2 weeks. Off-Loading: Wound #2 Right Amputation Site - Below Knee: Roho cushion for wheelchair Mattress Turn and reposition every 2 hours Additional Orders / Instructions: Wound #2 Right Amputation Site - Below Knee: Increase protein intake. The following medication(s) was prescribed: lidocaine topical 4 % cream 1 1 cream topical was prescribed at facility Electronic Signature(s) Signed: 02/02/2018 9:52:36 AM By: Lawanda Cousins Entered By: Lawanda Cousins on 02/02/2018 09:52:35 Hach, [name] ([id number]) Siems, [name] ([id number]) -------------------------------------------------------------------------------- ROS/PFSH Details Patient Name: Brady, [name] Date of Service: 02/02/2018 8:45 AM Medical Record Number: [id number] Patient Account Number: [id number] Date of Birth/Sex: 08/11/1961 (57 y.o. M) Treating RN: [name] Primary Care Provider: [name] Other Clinician: Referring Provider: [name] Treating Provider/Extender: Cathie Olden in Treatment: 17 Information Obtained From Patient Wound History Do you currently have one or more open woundso Yes How many open wounds do you currently haveo 1 Approximately how long have you had your woundso about 2 months How have you been treating your  wound(s) until nowo santyl Has your wound(s) ever healed and then re-openedo No Have you had any lab work done in the past montho No Have you tested positive for an antibiotic resistant organism (MRSA, VRE)o No Have you tested positive for osteomyelitis (bone infection)o No Have you had any tests for circulation on your legso Yes Who ordered the testo PCP Where was the test doneo [facility] Medical History:  Negative for: Cataracts; Glaucoma; Optic Neuritis Hematologic/Lymphatic Medical History: Negative for: Anemia; Hemophilia; Human Immunodeficiency Virus; Lymphedema; Sickle Cell Disease Respiratory Medical History: Negative for: Aspiration; Asthma; Chronic Obstructive Pulmonary Disease (COPD); Pneumothorax; Sleep Apnea; Tuberculosis Cardiovascular Medical History: Positive for: Hypertension; Peripheral Arterial Disease Negative for: Angina; Arrhythmia; Congestive Heart Failure; Coronary Artery Disease; Deep Vein Thrombosis; Hypotension; Myocardial Infarction; Peripheral Venous Disease; Phlebitis; Vasculitis Gastrointestinal Medical History: Positive for: Crohnos Negative for: Cirrhosis ; Colitis; Hepatitis A; Hepatitis B; Hepatitis C Endocrine Medical History: Positive for: Type II Diabetes Brady, [name] ([id number]) Time with diabetes: 20+ years Treated with: Insulin Blood sugar tested every day: Yes Tested : Genitourinary Medical History: Past Medical History Notes: renal disorder - CKD Immunological Medical History: Negative for: Lupus Erythematosus; Raynaudos; Scleroderma Musculoskeletal Medical History: Negative for: Gout; Rheumatoid Arthritis; Osteoarthritis; Osteomyelitis Neurologic Medical History: Negative for: Dementia; Neuropathy; Quadriplegia; Paraplegia; Seizure Disorder Past Medical History Notes: CVA - 3 months ago Oncologic Medical History: Negative for: Received Chemotherapy; Received Radiation Immunizations Pneumococcal Vaccine: Received  Pneumococcal Vaccination: Yes Implantable Devices Family and Social History Cancer: No; Diabetes: Yes - Mother,Siblings; Heart Disease: No; Hereditary Spherocytosis: No; Hypertension: No; Kidney Disease: No; Lung Disease: No; Seizures: No; Stroke: No; Thyroid Problems: No; Tuberculosis: No; Former smoker - quit 20+ years ago; Marital Status - Married; Alcohol Use: Never; Drug Use: No History; Caffeine Use: Daily; Financial Concerns: No; Food, Clothing or Shelter Needs: No; Support System Lacking: No; Transportation Concerns: No; Advanced Directives: No; Patient does not want information on Advanced Directives Physician Affirmation I have reviewed and agree with the above information. Electronic Signature(s) Signed: 02/02/2018 3:41:30 PM By: Lawanda Cousins Signed: 02/02/2018 5:13:37 PM By: Alric Quan Entered By: Lawanda Cousins on 02/02/2018 09:40:10 Ghattas, [name] ([id number]) -------------------------------------------------------------------------------- [place] Details Patient Name: Alberto, Numan B. Date of Service: 02/02/2018 Medical Record Number: [id number] Patient Account Number: [id number] Date of Birth/Sex: 21-Jun-1961 (57 y.o. M) Treating RN: [name] Primary Care Provider: [name] Other Clinician: Referring Provider: [name] Treating Provider/Extender: Cathie Olden in Treatment: 17 Diagnosis Coding ICD-10 Codes Code Description Q22.979G Unspecified open wound, right lower leg, sequela Z89.511 Acquired absence of right leg below knee N18.6 End stage renal disease E11.622 Type 2 diabetes mellitus with other skin ulcer T81.31XS Disruption of external operation (surgical) wound, not elsewhere classified, sequela L73.2 Hidradenitis suppurativa Facility Procedures CPT4 Code: [id number] Description: 99213 - WOUND CARE VISIT-LEV 3 EST PT Modifier: Quantity: 1 Physician Procedures CPT4 Code: [id number] Description: 99213 - WC PHYS LEVEL 3 - EST PT  ICD-10 Diagnosis Description S81.801S Unspecified open wound, right lower leg, sequela Modifier: Quantity: 1 Electronic Signature(s) Signed: 02/02/2018 10:21:21 AM By: Alric Quan Signed: 02/02/2018 3:41:30 PM By: Lawanda Cousins Previous Signature: 02/02/2018 9:52:55 AM Version By: Lawanda Cousins Entered By: Alric Quan on 02/02/2018 10:21:21

## 2018-02-08 ENCOUNTER — Other Ambulatory Visit
Admission: RE | Admit: 2018-02-08 | Discharge: 2018-02-08 | Disposition: A | Discharge status: Home / Self Care | Payer: BLUE CROSS/BLUE SHIELD | Source: Ambulatory Visit | Attending: Internal Medicine | Admitting: Internal Medicine

## 2018-02-08 ENCOUNTER — Encounter: Payer: BLUE CROSS/BLUE SHIELD | Admitting: Internal Medicine

## 2018-02-08 DIAGNOSIS — E11622 Type 2 diabetes mellitus with other skin ulcer: Secondary | ICD-10-CM | POA: Diagnosis not present

## 2018-02-08 DIAGNOSIS — B999 Unspecified infectious disease: Secondary | ICD-10-CM | POA: Diagnosis present

## 2018-02-08 NOTE — Progress Notes (Signed)
[name] ([id number]) Visit Report for 02/02/2018 Arrival Information Details Patient Name: [name], [name] Date of Service: 02/02/2018 8:45 AM Medical Record Number: [id number] Patient Account Number: [id number] Date of Birth/Sex: 03-26-1961 (57 y.o. M) Treating RN: Montey Hora Primary Care Vinessa Macconnell: [name] Other Clinician: Referring Dorthy Magnussen: [name] Treating Ethleen Lormand/Extender: Cathie Olden in Treatment: 55 Visit Information History Since Last Visit Added or deleted any medications: No Patient Arrived: Wheel Chair Any new allergies or adverse reactions: No Arrival Time: 09:16 Had a fall or experienced change in No Accompanied By: spouse activities of daily living that may affect Transfer Assistance: None risk of falls: Patient Identification Verified: Yes Signs or symptoms of abuse/neglect since last visito No Secondary Verification Process Completed: Yes Hospitalized since last visit: No Patient Has Alerts: Yes Implantable device outside of the clinic excluding No Patient Alerts: DMII cellular tissue based products placed in the center since last visit: Has Dressing in Place as Prescribed: Yes Pain Present Now: No Electronic Signature(s) Signed: 02/02/2018 5:09:22 PM By: Montey Hora Entered By: Montey Hora on 02/02/2018 09:17:08 [name], [name] ([id number]) -------------------------------------------------------------------------------- Clinic Level of Care Assessment Details Patient Name: [name], [name] Date of Service: 02/02/2018 8:45 AM Medical Record Number: [id number] Patient Account Number: [id number] Date of Birth/Sex: April 14, 1961 (56 y.o. M) Treating RN: [name] Primary Care Hanifah Royse: [name] Other Clinician: Referring Chinmay Squier: [name] Treating Braycen Burandt/Extender: Cathie Olden in Treatment: 17 Clinic Level of Care Assessment Items TOOL 4 Quantity Score X - Use when only an EandM is performed  on FOLLOW-UP visit 1 0 ASSESSMENTS - Nursing Assessment / Reassessment X - Reassessment of Co-morbidities (includes updates in patient status) 1 10 X- 1 5 Reassessment of Adherence to Treatment Plan ASSESSMENTS - Wound and Skin Assessment / Reassessment X - Simple Wound Assessment / Reassessment - one wound 1 5 []  - 0 Complex Wound Assessment / Reassessment - multiple wounds []  - 0 Dermatologic / Skin Assessment (not related to wound area) ASSESSMENTS - Focused Assessment []  - Circumferential Edema Measurements - multi extremities 0 []  - 0 Nutritional Assessment / Counseling / Intervention []  - 0 Lower Extremity Assessment (monofilament, tuning fork, pulses) []  - 0 Peripheral Arterial Disease Assessment (using hand held doppler) ASSESSMENTS - Ostomy and/or Continence Assessment and Care []  - Incontinence Assessment and Management 0 []  - 0 Ostomy Care Assessment and Management (repouching, etc.) PROCESS - Coordination of Care X - Simple Patient / Family Education for ongoing care 1 15 []  - 0 Complex (extensive) Patient / Family Education for ongoing care []  - 0 Staff obtains Programmer, systems, Records, Test Results / Process Orders []  - 0 Staff telephones HHA, Nursing Homes / Clarify orders / etc []  - 0 Routine Transfer to another Facility (non-emergent condition) []  - 0 Routine Hospital Admission (non-emergent condition) []  - 0 New Admissions / Biomedical engineer / Ordering NPWT, Apligraf, etc. []  - 0 Emergency Hospital Admission (emergent condition) X- 1 10 Simple Discharge Coordination Davidoff, Rykker B. ([id number]) []  - 0 Complex (extensive) Discharge Coordination PROCESS - Special Needs []  - Pediatric / Minor Patient Management 0 []  - 0 Isolation Patient Management []  - 0 Hearing / Language / Visual special needs []  - 0 Assessment of Community assistance (transportation, D/C planning, etc.) []  - 0 Additional assistance / Altered mentation []  - 0 Support  Surface(s) Assessment (bed, cushion, seat, etc.) INTERVENTIONS - Wound Cleansing / Measurement X - Simple Wound Cleansing - one wound 1 5 []  - 0 Complex Wound Cleansing -  multiple wounds X- 1 5 Wound Imaging (photographs - any number of wounds) []  - 0 Wound Tracing (instead of photographs) X- 1 5 Simple Wound Measurement - one wound []  - 0 Complex Wound Measurement - multiple wounds INTERVENTIONS - Wound Dressings X - Small Wound Dressing one or multiple wounds 1 10 []  - 0 Medium Wound Dressing one or multiple wounds []  - 0 Large Wound Dressing one or multiple wounds X- 1 5 Application of Medications - topical []  - 0 Application of Medications - injection INTERVENTIONS - Miscellaneous []  - External ear exam 0 []  - 0 Specimen Collection (cultures, biopsies, blood, body fluids, etc.) []  - 0 Specimen(s) / Culture(s) sent or taken to Lab for analysis []  - 0 Patient Transfer (multiple staff / Civil Service fast streamer / Similar devices) []  - 0 Simple Staple / Suture removal (25 or less) []  - 0 Complex Staple / Suture removal (26 or more) []  - 0 Hypo / Hyperglycemic Management (close monitor of Blood Glucose) []  - 0 Ankle / Brachial Index (ABI) - do not check if billed separately X- 1 5 Vital Signs [name], [name] ([id number]) Has the patient been seen at the hospital within the last three years: Yes Total Score: 80 Level Of Care: New/Established - Level 3 Electronic Signature(s) Signed: 02/02/2018 5:13:37 PM By: Alric Quan Entered By: Alric Quan on 02/02/2018 10:21:13 [name], [name] ([id number]) -------------------------------------------------------------------------------- Encounter Discharge Information Details Patient Name: [name], [name] Date of Service: 02/02/2018 8:45 AM Medical Record Number: [id number] Patient Account Number: [id number] Date of Birth/Sex: October 25, 1960 (57 y.o. M) Treating RN: [name] Primary Care Masato Pettie: [name] Other  Clinician: Referring Idonia Zollinger: [name] Treating Key Cen/Extender: Cathie Olden in Treatment: 17 Encounter Discharge Information Items Discharge Pain Level: 0 Discharge Condition: Stable Ambulatory Status: Wheelchair Discharge Destination: Home Transportation: Private Auto Accompanied By: wife Schedule Follow-up Appointment: Yes Medication Reconciliation completed and No provided to Patient/Care Meili Kleckley: Provided on Clinical Summary of Care: 02/02/2018 Form Type Recipient Paper Patient MM Electronic Signature(s) Signed: 02/03/2018 3:53:00 PM By: Ruthine Dose Entered By: Ruthine Dose on 02/02/2018 09:38:55 Gaultney, [name] ([id number]) -------------------------------------------------------------------------------- Lower Extremity Assessment Details Patient Name: [name], [name] Date of Service: 02/02/2018 8:45 AM Medical Record Number: [id number] Patient Account Number: [id number] Date of Birth/Sex: 1960/11/01 (57 y.o. M) Treating RN: Montey Hora Primary Care Makhi Muzquiz: [name] Other Clinician: Referring Jasiri Hanawalt: [name] Treating Ande Therrell/Extender: Cathie Olden in Treatment: 17 Vascular Assessment Pulses: Posterior Tibial Popliteal Palpable: [Right:Yes] Extremity colors, hair growth, and conditions: Extremity Color: [Right:Normal] Hair Growth on Extremity: [Right:Yes] Temperature of Extremity: [Right:Warm] Capillary Refill: [Right:< 3 seconds] Electronic Signature(s) Signed: 02/02/2018 5:09:22 PM By: Montey Hora Entered By: Montey Hora on 02/02/2018 09:22:29 [name], [name] ([id number]) -------------------------------------------------------------------------------- Multi Wound Chart Details Patient Name: [name], [name] Date of Service: 02/02/2018 8:45 AM Medical Record Number: [id number] Patient Account Number: [id number] Date of Birth/Sex: [birth year] (57 y.o. M) Treating RN: [name] Primary Care Desirey Keahey:  [name] Other Clinician: Referring Jerod Mcquain: [name] Treating Rhiannan Kievit/Extender: Cathie Olden in Treatment: 17 Vital Signs Height(in): 71 Pulse(bpm): 73 Weight(lbs): 178 Blood Pressure(mmHg): 149/75 Body Mass Index(BMI): 25 Temperature(F): 98.4 Respiratory Rate 18 (breaths/min): Photos: [2:No Photos] [N/A:N/A] Wound Location: [2:Right Amputation Site - Below Knee] [N/A:N/A] Wounding Event: [2:Surgical Injury] [N/A:N/A] Primary Etiology: [2:Open Surgical Wound] [N/A:N/A] Comorbid History: [2:Hypertension, Peripheral Arterial Disease, Crohnos, Type II Diabetes] [N/A:N/A] Date Acquired: [2:06/27/2017] [N/A:N/A] Weeks of Treatment: [2:10] [N/A:N/A] Wound Status: [2:Open] [N/A:N/A] Measurements L x W x D [2:0.2x0.2x0.3] [N/A:N/A] (cm)  Area (cm) : [2:0.031] [N/A:N/A] Volume (cm) : [2:0.009] [N/A:N/A] % Reduction in Area: [2:89.00%] [N/A:N/A] % Reduction in Volume: [2:84.20%] [N/A:N/A] Classification: [2:Full Thickness Without Exposed Support Structures] [N/A:N/A] Exudate Amount: [2:Medium] [N/A:N/A] Exudate Type: [2:Serous] [N/A:N/A] Exudate Color: [2:amber] [N/A:N/A] Wound Margin: [2:Flat and Intact] [N/A:N/A] Granulation Amount: [2:Large (67-100%)] [N/A:N/A] Granulation Quality: [2:Red] [N/A:N/A] Necrotic Amount: [2:Small (1-33%)] [N/A:N/A] Exposed Structures: [2:Fat Layer (Subcutaneous Tissue) Exposed: Yes Fascia: No Tendon: No Muscle: No Joint: No Bone: No] [N/A:N/A] Epithelialization: [2:None] [N/A:N/A] Periwound Skin Texture: [2:Excoriation: No Induration: No Callus: No] [N/A:N/A] Crepitus: No Rash: No Scarring: No Periwound Skin Moisture: Maceration: No N/A N/A Dry/Scaly: No Periwound Skin Color: Atrophie Blanche: No N/A N/A Cyanosis: No Ecchymosis: No Erythema: No Hemosiderin Staining: No Mottled: No Pallor: No Rubor: No Temperature: No Abnormality N/A N/A Tenderness on Palpation: No N/A N/A Wound Preparation: Ulcer Cleansing: N/A  N/A Rinsed/Irrigated with Saline Topical Anesthetic Applied: Other: lidocaine 4% Treatment Notes Wound #2 (Right Amputation Site - Below Knee) 1. Cleansed with: Clean wound with Normal Saline 2. Anesthetic Topical Lidocaine 4% cream to wound bed prior to debridement 3. Peri-wound Care: Skin Prep 4. Dressing Applied: Prisma Ag 5. Secondary Dressing Applied Bordered Foam Dressing Electronic Signature(s) Signed: 02/02/2018 9:38:44 AM By: Lawanda Cousins Entered By: Lawanda Cousins on 02/02/2018 09:38:43 [name], [name] ([id number]) -------------------------------------------------------------------------------- [name] Details Patient Name: [name], [name] Date of Service: 02/02/2018 8:45 AM Medical Record Number: [id number] Patient Account Number: [id number] Date of Birth/Sex: [birth year] (57 y.o. M) Treating RN: [name] Primary Care Damesha Lawler: [name] Other Clinician: Referring Bonham Zingale: [name] Treating Corsica Franson/Extender: Cathie Olden in Treatment: 77 Active Inactive ` Abuse / Safety / Falls / Self Care Management Nursing Diagnoses: Impaired physical mobility Goals: Patient will remain injury free related to falls Date Initiated: 10/06/2017 Target Resolution Date: 12/24/2017 Goal Status: Active Interventions: Assess fall risk on admission and as needed Notes: ` Orientation to the Wound Care Program Nursing Diagnoses: Knowledge deficit related to the wound healing center program Goals: Patient/caregiver will verbalize understanding of the [facility] Program Date Initiated: 10/06/2017 Target Resolution Date: 12/24/2017 Goal Status: Active Interventions: Provide education on orientation to the wound center Notes: ` Pressure Nursing Diagnoses: Knowledge deficit related to management of pressures ulcers Goals: Patient will remain free from development of additional pressure ulcers Date Initiated:  11/10/2017 Target Resolution Date: 11/25/2017 Goal Status: Active Interventions: Krausz, Lonn B. ([id number]) Assess: immobility, friction, shearing, incontinence upon admission and as needed Assess offloading mechanisms upon admission and as needed Treatment Activities: Patient referred for pressure reduction/relief devices : 11/10/2017 Notes: ` Wound/Skin Impairment Nursing Diagnoses: Impaired tissue integrity Goals: Ulcer/skin breakdown will heal within 14 weeks Date Initiated: 10/06/2017 Target Resolution Date: 12/24/2017 Goal Status: Active Interventions: Assess patient/caregiver ability to obtain necessary supplies Assess patient/caregiver ability to perform ulcer/skin care regimen upon admission and as needed Assess ulceration(s) every visit Notes: Electronic Signature(s) Signed: 02/02/2018 5:13:37 PM By: Alric Quan Entered By: Alric Quan on 02/02/2018 09:25:41 Banta, Bronson B. ([id number]) -------------------------------------------------------------------------------- Pain Assessment Details Patient Name: [name], [name] Date of Service: 02/02/2018 8:45 AM Medical Record Number: [id number] Patient Account Number: [id number] Date of Birth/Sex: [birth year] (57 y.o. M) Treating RN: Montey Hora Primary Care Tiegan Terpstra: [name] Other Clinician: Referring Mylea Roarty: [name] Treating Oron Westrup/Extender: Cathie Olden in Treatment: 17 Active Problems Location of Pain Severity and Description of Pain Patient Has Paino No Site Locations Pain Management and Medication Current Pain Management: Electronic Signature(s) Signed: 02/02/2018 5:09:22 PM By: Montey Hora  Entered By: Montey Hora on 02/02/2018 09:17:15 [name], [name] ([id number]) -------------------------------------------------------------------------------- Patient/Caregiver Education Details Patient Name: Guderian, Jatniel B. Date of Service: 02/02/2018 8:45 AM Medical Record  Number: [id number] Patient Account Number: [id number] Date of Birth/Gender: 1961/05/14 (57 y.o. M) Treating RN: [name] Primary Care Physician: [name] Other Clinician: Referring Physician: [name] Treating Physician/Extender: Cathie Olden in Treatment: 17 Education Assessment Education Provided To: Patient Education Topics Provided Wound/Skin Impairment: Handouts: Caring for Your Ulcer, Other: change dressing as ordered Methods: Demonstration, Explain/Verbal Responses: State content correctly Electronic Signature(s) Signed: 02/02/2018 5:13:37 PM By: Alric Quan Entered By: Alric Quan on 02/02/2018 09:31:46 Masek, Magic B. ([id number]) -------------------------------------------------------------------------------- Wound Assessment Details Patient Name: Dattilio, Coty B. Date of Service: 02/02/2018 8:45 AM Medical Record Number: [id number] Patient Account Number: [id number] Date of Birth/Sex: 03-Oct-1961 (57 y.o. M) Treating RN: Montey Hora Primary Care Damare Serano: [name] Other Clinician: Referring Govind Furey: [name] Treating Isma Tietje/Extender: Lawanda Cousins Weeks in Treatment: 17 Wound Status Wound Number: 2 Primary Open Surgical Wound Etiology: Wound Location: Right Amputation Site - Below Knee Wound Open Wounding Event: Surgical Injury Status: Date Acquired: 06/27/2017 Comorbid Hypertension, Peripheral Arterial Disease, Weeks Of Treatment: 10 History: Crohnos, Type II Diabetes Clustered Wound: No Photos Photo Uploaded By: Montey Hora on 02/02/2018 10:14:03 Wound Measurements Length: (cm) 0.2 Width: (cm) 0.2 Depth: (cm) 0.3 Area: (cm) 0.031 Volume: (cm) 0.009 % Reduction in Area: 89% % Reduction in Volume: 84.2% Epithelialization: None Tunneling: No Undermining: No Wound Description Full Thickness Without Exposed Support Classification: Structures Wound Margin: Flat and  Intact Exudate Medium Amount: Exudate Type: Serous Exudate Color: amber Foul Odor After Cleansing: No Slough/Fibrino Yes Wound Bed Granulation Amount: Large (67-100%) Exposed Structure Granulation Quality: Red Fascia Exposed: No Necrotic Amount: Small (1-33%) Fat Layer (Subcutaneous Tissue) Exposed: Yes Necrotic Quality: Adherent Slough Tendon Exposed: No Muscle Exposed: No Joint Exposed: No Bone Exposed: No Behney, Anzel B. ([id number]) Periwound Skin Texture Texture Color No Abnormalities Noted: No No Abnormalities Noted: No Callus: No Atrophie Blanche: No Crepitus: No Cyanosis: No Excoriation: No Ecchymosis: No Induration: No Erythema: No Rash: No Hemosiderin Staining: No Scarring: No Mottled: No Pallor: No Moisture Rubor: No No Abnormalities Noted: No Dry / Scaly: No Temperature / Pain Maceration: No Temperature: No Abnormality Wound Preparation Ulcer Cleansing: Rinsed/Irrigated with Saline Topical Anesthetic Applied: Other: lidocaine 4%, Electronic Signature(s) Signed: 02/02/2018 5:09:22 PM By: Montey Hora Entered By: Montey Hora on 02/02/2018 09:21:56 Buesing, [name] ([id number]) -------------------------------------------------------------------------------- Vitals Details Patient Name: Guizar, Wilian B. Date of Service: 02/02/2018 8:45 AM Medical Record Number: [id number] Patient Account Number: [id number] Date of Birth/Sex: 05/02/1961 (57 y.o. M) Treating RN: Montey Hora Primary Care Nacole Fluhr: [name] Other Clinician: Referring Imoni Kohen: [name] Treating Ashleigh Luckow/Extender: Cathie Olden in Treatment: 17 Vital Signs Time Taken: 09:17 Temperature (F): 98.4 Height (in): 71 Pulse (bpm): 53 Weight (lbs): 178 Respiratory Rate (breaths/min): 18 Body Mass Index (BMI): 24.8 Blood Pressure (mmHg): 149/75 Reference Range: 80 - 120 mg / dl Electronic Signature(s) Signed: 02/02/2018 5:09:22 PM By: Montey Hora Entered  By: Montey Hora on 02/02/2018 09:17:57

## 2018-02-10 LAB — AEROBIC CULTURE W GRAM STAIN (SUPERFICIAL SPECIMEN)

## 2018-02-10 LAB — AEROBIC CULTURE  (SUPERFICIAL SPECIMEN)

## 2018-02-12 NOTE — Progress Notes (Signed)
Richoux, DAUNDRE BIEL ([id number]) Visit Report for 02/08/2018 HPI Details Patient Name: [name], [name] Date of Service: 02/08/2018 9:15 AM Medical Record Number: [id number] Patient Account Number: [id number] Date of Birth/Sex: 1961/01/20 (57 y.o. M) Treating RN: [name] Primary Care Provider: [name] Other Clinician: Referring Provider: [name] Treating Provider/Extender: [name]: 17 History of Present Illness Location: Patient has a ulcer to Sacrum Quality: denies pain Severity: denies pain Duration: months, but unable to clearly articulate timeframe Context: ulcer occurred secondary to pressure HPI Description: 10-06-17 he is here for initial evaluation of the sacral ulcer. He is unable to articulate exactly this is a states "months". 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 He is currently in a facility, [facility]. He is unsure if he has a pressure reduction mattress. He admits to sitting the majority of the day and sleeping on his back. We discussed the need for lateral sleeping, which he states he is able to do. He voices no complaints of pain, discomfort, fever, chills, ill feeling. He tolerated debridement. 10/26/17 on evaluation today patient sacral wound appears to be doing excellent. He has been tolerating the dressing changes without complication. He does have some discomfort especially when he has to sit in the chair at dialysis for an extended period of time. With that being said other than that he tries to stay offloaded as much as possible and I think he has done a very good job in that regard. There does not appear to be any new pressure injury. 11/10/17 He is here in follow up evaluation of a sacral ulcer. He is now home, have been using prisma. He presents with essentially unchanged measurements and increased in nonviable tissue. Prior to prisma they were using santyl, will  transition back to santyl (spouse to call clinic if they do not have santyl at home). We discussed more aggressive offloading as he continues to sleep on his back, improved diet as he tends to eat "junk food". He does use a pressure redistribution cushion while in dialysis. Follow up in two weeks per their preference. 11/24/17-he is here in follow-up evaluation for a sacral ulcer and dates that his vascular surgeon at [facility] wants to "know if we can use the same medicine" on the BKA stump. Patient's bowel did contact the office, they do not have Santyl at home as originally thought. We will initiate Santyl to both wounds today. He states he does "the best I can" as far as repositioning but has inconsistent offloading while sleeping. He does use the chair cushion while in dialysis. Continue with follow-up in 2 weeks per their preference 12/22/17-he is here in follow-up evaluation for a sacral ulcer and right BKA stump ulcer. He was hospitalized at [facility] from 2/17-2/25 for sepsis due to right groin abscess, but her surgical consult note this is hidradenitis and required no surgical intervention. He was treated on the inpatient side with daptomycin. He was discharged on doxycycline which he has completed. he presents today with a healed sacral ulcer. He has a follow-up appointment at [facility] in a couple of weeks regarding the right BKA stump. He will follow-up in 2 weeks per his preference. 01/05/18-He is here in follow up evaluation for right BKA stump ulcer. There is improvement of this ulcer and we will continue with Santyl. He has seen vascular medicine at [facility] regarding this with no changes in treatment plan. He  does have a history of hidradenitis and is following Dr. Otho Ket at [facility] dermatology which he saw Monday. He notes increased pain but no change in drainage to his groin. We will culture this today and hold off on antibiotic therapy until sensitivities resulted; he see Dr Otho Ket in three  weeks 01/19/18-He is here in follow-up evaluation for right BKA stump. There is improvement, we will change to Prisma. The culture that was obtained from his right groin last week grew Enterococcus faecalis; Monday he was started on Augmentin. He states he is tolerating the Augmentin with no adverse GI effects. He has no appointment later this month with dermatology. He will follow-up here in 2 weeks 02/02/18-He is here in preparation for right BKA stump ulcer. There is minimal improvement but we will continue with Prisma. He does have a follow-up appointment with vascular medicine this afternoon. His appointment with dermatology as in June not later this month as he originally thought. He does note improvement in drainage from his right groin/hidradenitis. He will Kornegay, Rylan B. ([id number]) follow-up in 2 weeks per his preference. 02/08/18; he was brought in earlier today to see [place] with regards to concerns raised by his wife about purulent drainage coming out of the stump area around the wound that she noted on Sunday for the first time. He is not complaining of pain. There are no systemic symptoms. He was on Augmentin earlier this week for Enterococcus faecalis that was cultured from his right groin. Purulent drainage also noted by our intake nurse when the dressing was changed Electronic Signature(s) Signed: 02/08/2018 5:40:08 PM By: Linton Ham MD Entered By: Linton Ham on 02/08/2018 10:06:51 [name], [name] ([id number]) -------------------------------------------------------------------------------- Physical Exam Details Patient Name: [name], [name] Date of Service: 02/08/2018 9:15 AM Medical Record Number: [id number] Patient Account Number: [id number] Date of Birth/Sex: Mar 09, 1961 (57 y.o. M) Treating RN: [name] Primary Care Provider: [name] Other Clinician: Referring Provider: [name] Treating Provider/Extender: [name]:  17 Constitutional Sitting or standing Blood Pressure is within target range for patient.. Pulse regular and within target range for patient.Marland Kitchen Respirations regular, non-labored and within target range.. Temperature is normal and within the target range for the patient.Marland Kitchen appears in no distress. Respiratory Respiratory effort is easy and symmetric bilaterally. Rate is normal at rest and on room air.. . Cardiovascular Heart rhythm and rate regular, without murmur or gallop.. he has a risk pulse in the popliteal fossa on the right. No evidence of ischemia. Lymphatic none palpable in the popliteal or inguinal area. Integumentary (Hair, Skin) there is some mild skin irritation laterally to the wound area. I don't think this represents cellulitis although I'm not too sure. Looks more like tape irritation perhaps. Psychiatric No evidence of depression, anxiety, or agitation. Calm, cooperative, and communicative. Appropriate interactions and affect.. Notes 02/08/18; small probing area in the right stump medially. This goes down a centimeter. No clear purulent drainage. As noted there is some erythema laterally from the wound area although this looks more like tape irritation than anything else. Otherwise the stump appeared normal. There was no tenderness nothing that appeared to be an abscess.no evidence of ischemia of the stump itself Electronic Signature(s) Signed: 02/08/2018 5:40:08 PM By: Linton Ham MD Entered By: Linton Ham on 02/08/2018 10:10:22 [name], [name] ([id number]) -------------------------------------------------------------------------------- Physician Orders Details Patient Name: [name], [name] Date of Service: 02/08/2018 9:15 AM Medical Record Number: [id number] Patient Account Number: [id number] Date of Birth/Sex: 1961/04/08 (57 y.o. M) Treating  RN: [name] Primary Care Provider: [name] Other Clinician: Referring Provider: [name] Treating  Provider/Extender: [name]: 32 Verbal / Phone Orders: No Diagnosis Coding Wound Cleansing Wound #2 Right Amputation Site - Below Knee o Clean wound with Normal Saline. Anesthetic (add to Medication List) Wound #2 Right Amputation Site - Below Knee o Topical Lidocaine 4% cream applied to wound bed prior to debridement (In Clinic Only). Primary Wound Dressing o Pack wound with: - s. cell Secondary Dressing Wound #2 Right Amputation Site - Below Knee o Boardered Foam Dressing Dressing Change Frequency Wound #2 Right Amputation Site - Below Knee o Change dressing every day. Follow-up Appointments Wound #2 Right Amputation Site - Below Knee o Return Appointment in 1 week. Medications-please add to medication list. Wound #2 Right Amputation Site - Below Knee o P.O. Antibiotics - Doxycycline Laboratory o Bacteria identified in Wound by Culture (MICRO) - Right stump BKA oooo LOINC Code: 3810-1 oooo Convenience Name: Wound culture routine Patient Medications Allergies: adhesive, cefepime, methotrexate, silicon, vancomycin Notifications Medication Indication Start End doxycycline monohydrate stump infection 02/08/2018 DOSE oral 100 mg capsule - 1 capsule oral bid for 7 days [name], [name] ([id number]) Electronic Signature(s) Signed: 02/08/2018 10:13:53 AM By: Linton Ham MD Entered By: Linton Ham on 02/08/2018 10:13:52 Spindel, [name] ([id number]) -------------------------------------------------------------------------------- Problem List Details Patient Name: [name], [name] Date of Service: 02/08/2018 9:15 AM Medical Record Number: [id number] Patient Account Number: [id number] Date of Birth/Sex: [birth year] (57 y.o. M) Treating RN: [name] Primary Care Provider: [name] Other Clinician: Referring Provider: [name] Treating Provider/Extender: [name]: 17 Active  Problems ICD-10 Impacting Encounter Code Description Active Date Wound Healing Diagnosis S81.801S Unspecified open wound, right lower leg, sequela 11/24/2017 Yes Z89.511 Acquired absence of right leg below knee 10/06/2017 Yes N18.6 End stage renal disease 10/06/2017 Yes E11.622 Type 2 diabetes mellitus with other skin ulcer 10/06/2017 Yes T81.31XS Disruption of external operation (surgical) wound, not 11/24/2017 Yes elsewhere classified, sequela L73.2 Hidradenitis suppurativa 01/05/2018 Yes Inactive Problems Resolved Problems ICD-10 Code Description Active Date Resolved Date L89.153 Pressure ulcer of sacral region, stage 3 10/06/2017 12/22/2017 Electronic Signature(s) Signed: 02/08/2018 5:40:08 PM By: Linton Ham MD Entered By: Linton Ham on 02/08/2018 10:05:07 [name], [name] ([id number]) -------------------------------------------------------------------------------- Progress Note Details Patient Name: [name], [name] Date of Service: 02/08/2018 9:15 AM Medical Record Number: [id number] Patient Account Number: [id number] Date of Birth/Sex: 03-25-1961 (57 y.o. M) Treating RN: [name] Primary Care Provider: [name] Other Clinician: Referring Provider: [name] Treating Provider/Extender: [name]: 17 Subjective History of Present Illness (HPI) The following HPI elements were documented for the patient's wound: Location: Patient has a ulcer to Sacrum Quality: denies pain Severity: denies pain Duration: months, but unable to clearly articulate timeframe Context: ulcer occurred secondary to pressure 10-06-17 he is here for initial evaluation of the sacral ulcer. He is unable to articulate exactly this is a states "months". 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 He is currently in a facility, [facility]. He is unsure if he has a pressure reduction mattress. He admits to sitting  the majority of the day and sleeping on his back. We discussed the need for lateral sleeping, which he states he is able to do. He voices no complaints of pain, discomfort, fever, chills, ill feeling. He tolerated debridement. 10/26/17 on evaluation today patient sacral wound  appears to be doing excellent. He has been tolerating the dressing changes without complication. He does have some discomfort especially when he has to sit in the chair at dialysis for an extended period of time. With that being said other than that he tries to stay offloaded as much as possible and I think he has done a very good job in that regard. There does not appear to be any new pressure injury. 11/10/17 He is here in follow up evaluation of a sacral ulcer. He is now home, have been using prisma. He presents with essentially unchanged measurements and increased in nonviable tissue. Prior to prisma they were using santyl, will transition back to santyl (spouse to call clinic if they do not have santyl at home). We discussed more aggressive offloading as he continues to sleep on his back, improved diet as he tends to eat "junk food". He does use a pressure redistribution cushion while in dialysis. Follow up in two weeks per their preference. 11/24/17-he is here in follow-up evaluation for a sacral ulcer and dates that his vascular surgeon at [facility] wants to "know if we can use the same medicine" on the BKA stump. Patient's bowel did contact the office, they do not have Santyl at home as originally thought. We will initiate Santyl to both wounds today. He states he does "the best I can" as far as repositioning but has inconsistent offloading while sleeping. He does use the chair cushion while in dialysis. Continue with follow-up in 2 weeks per their preference 12/22/17-he is here in follow-up evaluation for a sacral ulcer and right BKA stump ulcer. He was hospitalized at [facility] from 2/17-2/25 for sepsis due to right groin  abscess, but her surgical consult note this is hidradenitis and required no surgical intervention. He was treated on the inpatient side with daptomycin. He was discharged on doxycycline which he has completed. he presents today with a healed sacral ulcer. He has a follow-up appointment at [facility] in a couple of weeks regarding the right BKA stump. He will follow-up in 2 weeks per his preference. 01/05/18-He is here in follow up evaluation for right BKA stump ulcer. There is improvement of this ulcer and we will continue with Santyl. He has seen vascular medicine at [facility] regarding this with no changes in treatment plan. He does have a history of hidradenitis and is following Dr. Otho Ket at [facility] dermatology which he saw Monday. He notes increased pain but no change in drainage to his groin. We will culture this today and hold off on antibiotic therapy until sensitivities resulted; he see Dr Otho Ket in three weeks 01/19/18-He is here in follow-up evaluation for right BKA stump. There is improvement, we will change to Prisma. The culture that was obtained from his right groin last week grew Enterococcus faecalis; Monday he was started on Augmentin. He states he is tolerating the Augmentin with no adverse GI effects. He has no appointment later this month with dermatology. He will follow-up here in 2 weeks 02/02/18-He is here in preparation for right BKA stump ulcer. There is minimal improvement but we will continue with Prisma. He does have a follow-up appointment with vascular medicine this afternoon. His appointment with dermatology as in June not later this month as he originally thought. He does note improvement in drainage from his right groin/hidradenitis. He will [name], [name] ([id number]) follow-up in 2 weeks per his preference. 02/08/18; he was brought in earlier today to see [place] with regards to concerns raised by  his wife about purulent drainage coming out of the stump area around the wound that she  noted on Sunday for the first time. He is not complaining of pain. There are no systemic symptoms. He was on Augmentin earlier this week for Enterococcus faecalis that was cultured from his right groin. Purulent drainage also noted by our intake nurse when the dressing was changed Objective Constitutional Sitting or standing Blood Pressure is within target range for patient.. Pulse regular and within target range for patient.Marland Kitchen Respirations regular, non-labored and within target range.. Temperature is normal and within the target range for the patient.Marland Kitchen appears in no distress. Vitals Time Taken: 9:31 AM, Height: 71 in, Weight: 178 lbs, BMI: 24.8, Temperature: 97.9 F, Pulse: 62 bpm, Respiratory Rate: 18 breaths/min, Blood Pressure: 129/76 mmHg. Respiratory Respiratory effort is easy and symmetric bilaterally. Rate is normal at rest and on room air.. Cardiovascular Heart rhythm and rate regular, without murmur or gallop.. he has a risk pulse in the popliteal fossa on the right. No evidence of ischemia. Lymphatic none palpable in the popliteal or inguinal area. Psychiatric No evidence of depression, anxiety, or agitation. Calm, cooperative, and communicative. Appropriate interactions and affect.. General Notes: 02/08/18; small probing area in the right stump medially. This goes down a centimeter. No clear purulent drainage. As noted there is some erythema laterally from the wound area although this looks more like tape irritation than anything else. Otherwise the stump appeared normal. There was no tenderness nothing that appeared to be an abscess.no evidence of ischemia of the stump itself Integumentary (Hair, Skin) there is some mild skin irritation laterally to the wound area. I don't think this represents cellulitis although I'm not too sure. Looks more like tape irritation perhaps. Wound #2 status is Open. Original cause of wound was Surgical Injury. The wound is located on the Right  Amputation Site - Below Knee. The wound measures 0.3cm length x 0.3cm width x 0.5cm depth; 0.071cm^2 area and 0.035cm^3 volume. There is Fat Layer (Subcutaneous Tissue) Exposed exposed. There is no undermining noted, however, there is tunneling at 1:00 with a maximum distance of 0.5cm. There is a large amount of purulent drainage noted. The wound margin is flat and intact. There is large (67-100%) red granulation within the wound bed. There is a small (1-33%) amount of necrotic tissue within the wound bed including Adherent Slough. The periwound skin appearance exhibited: Excoriation, Erythema. The periwound skin appearance did not exhibit: Callus, Crepitus, Induration, Rash, Scarring, Dry/Scaly, Maceration, Atrophie Blanche, Cyanosis, Ecchymosis, Hemosiderin Staining, Mottled, Pallor, Rubor. The surrounding wound skin color is noted with erythema which is circumferential. Periwound temperature was noted as No Abnormality. Alridge, Fenton B. ([id number]) Assessment Active Problems ICD-10 S81.801S - Unspecified open wound, right lower leg, sequela Z89.511 - Acquired absence of right leg below knee N18.6 - End stage renal disease E11.622 - Type 2 diabetes mellitus with other skin ulcer T81.31XS - Disruption of external operation (surgical) wound, not elsewhere classified, sequela L73.2 - Hidradenitis suppurativa Plan Wound Cleansing: Wound #2 Right Amputation Site - Below Knee: Clean wound with Normal Saline. Anesthetic (add to Medication List): Wound #2 Right Amputation Site - Below Knee: Topical Lidocaine 4% cream applied to wound bed prior to debridement (In Clinic Only). Primary Wound Dressing: Pack wound with: - s. cell Secondary Dressing: Wound #2 Right Amputation Site - Below Knee: Boardered Foam Dressing Dressing Change Frequency: Wound #2 Right Amputation Site - Below Knee: Change dressing every day. Follow-up Appointments: Wound #2 Right Amputation Site -  Below Knee: Return  Appointment in 1 week. Medications-please add to medication list.: Wound #2 Right Amputation Site - Below Knee: P.O. Antibiotics - Doxycycline Laboratory ordered were: Wound culture routine - Right stump BKA The following medication(s) was prescribed: doxycycline monohydrate oral 100 mg capsule 1 capsule oral bid for 7 days for stump infection starting 02/08/2018 #1 change the primary dressing to silver alginate strips #2 CandS done of the actual wound area scant sanguinous drainage. #3 Emperic doxycycline 100 twice a day Electronic Signature(s) [name], [name] ([id number]) Signed: 02/08/2018 10:14:23 AM By: Linton Ham MD Entered By: Linton Ham on 02/08/2018 10:14:23 [name], [name] ([id number]) -------------------------------------------------------------------------------- SuperBill Details Patient Name: [name], [name] Date of Service: 02/08/2018 Medical Record Number: [id number] Patient Account Number: [id number] Date of Birth/Sex: Sep 24, 1961 (57 y.o. M) Treating RN: [name] Primary Care Provider: [name] Other Clinician: Referring Provider: [name] Treating Provider/Extender: [name]: 17 Diagnosis Coding ICD-10 Codes Code Description Z98.022H Unspecified open wound, right lower leg, sequela Z89.511 Acquired absence of right leg below knee N18.6 End stage renal disease E11.622 Type 2 diabetes mellitus with other skin ulcer T81.31XS Disruption of external operation (surgical) wound, not elsewhere classified, sequela L73.2 Hidradenitis suppurativa L03.115 Cellulitis of right lower limb Facility Procedures CPT4 Code: [id number] Description: 99213 - WOUND CARE VISIT-LEV 3 EST PT Modifier: Quantity: 1 Physician Procedures CPT4 Code Description: [id number] 75301 - WC PHYS LEVEL 3 - EST PT ICD-10 Diagnosis Description T81.31XS Disruption of external operation (surgical) wound, not elsewh S81.801S Unspecified open wound, right  lower leg, sequela L03.115 Cellulitis of right  lower limb Modifier: ere classified, s Quantity: 1 equela Electronic Signature(s) Signed: 02/08/2018 5:40:08 PM By: Linton Ham MD Entered By: Linton Ham on 02/08/2018 10:15:17

## 2018-02-14 NOTE — Progress Notes (Signed)
[name] ([id number]) Visit Report for 02/08/2018 Arrival Information Details Patient Name: [name], [name]. Date of Service: 02/08/2018 9:15 AM Medical Record Number: [id number] Patient Account Number: [id number] Date of Birth/Sex: 1961/02/01 (57 y.o. M) Treating RN: [name] Primary Care [name]: [name] Other Clinician: Referring [name]: [name] Treating [name]/Extender: [name] in Treatment: 48 Visit Information History Since Last Visit All ordered tests and consults were completed: No Patient Arrived: Wheel Chair Added or deleted any medications: No Arrival Time: 09:29 Any new allergies or adverse reactions: No Accompanied By: wife Had a fall or experienced change in No activities of daily living that may affect Transfer Assistance: Other risk of falls: Patient Identification Verified: Yes Signs or symptoms of abuse/neglect since last visito No Secondary Verification Process Completed: Yes Hospitalized since last visit: No Patient Requires Transmission-Based No Implantable device outside of the clinic excluding No Precautions: cellular tissue based products placed in the center Patient Has Alerts: Yes since last visit: Patient Alerts: DMII Has Dressing in Place as Prescribed: Yes Pain Present Now: Yes Electronic Signature(s) Signed: 02/10/2018 4:29:16 PM By: Alric Quan Entered By: Alric Quan on 02/08/2018 09:30:55 [name] ([id number]) -------------------------------------------------------------------------------- Clinic Level of Care Assessment Details Patient Name: [name], [name]. Date of Service: 02/08/2018 9:15 AM Medical Record Number: [id number] Patient Account Number: [id number] Date of Birth/Sex: 1961/09/03 (57 y.o. M) Treating RN: [name] Primary Care Ginia Rudell: [name] Other Clinician: Referring [name]: [name] Treating [name]/Extender: [name] in  Treatment: 17 Clinic Level of Care Assessment Items TOOL 4 Quantity Score []  - Use when only an EandM is performed on FOLLOW-UP visit 0 ASSESSMENTS - Nursing Assessment / Reassessment []  - Reassessment of Co-morbidities (includes updates in patient status) 0 X- 1 5 Reassessment of Adherence to Treatment Plan ASSESSMENTS - Wound and Skin Assessment / Reassessment X - Simple Wound Assessment / Reassessment - one wound 1 5 []  - 0 Complex Wound Assessment / Reassessment - multiple wounds []  - 0 Dermatologic / Skin Assessment (not related to wound area) ASSESSMENTS - Focused Assessment []  - Circumferential Edema Measurements - multi extremities 0 []  - 0 Nutritional Assessment / Counseling / Intervention []  - 0 Lower Extremity Assessment (monofilament, tuning fork, pulses) []  - 0 Peripheral Arterial Disease Assessment (using hand held doppler) ASSESSMENTS - Ostomy and/or Continence Assessment and Care []  - Incontinence Assessment and Management 0 []  - 0 Ostomy Care Assessment and Management (repouching, etc.) PROCESS - Coordination of Care X - Simple Patient / Family Education for ongoing care 1 15 []  - 0 Complex (extensive) Patient / Family Education for ongoing care X- 1 10 Staff obtains Programmer, systems, Records, Test Results / Process Orders []  - 0 Staff telephones HHA, Nursing Homes / Clarify orders / etc []  - 0 Routine Transfer to another Facility (non-emergent condition) []  - 0 Routine Hospital Admission (non-emergent condition) []  - 0 New Admissions / Biomedical engineer / Ordering NPWT, Apligraf, etc. []  - 0 Emergency Hospital Admission (emergent condition) X- 1 10 Simple Discharge Coordination [name]. ([id number]) []  - 0 Complex (extensive) Discharge Coordination PROCESS - Special Needs []  - Pediatric / Minor Patient Management 0 []  - 0 Isolation Patient Management []  - 0 Hearing / Language / Visual special needs []  - 0 Assessment of Community  assistance (transportation, D/C planning, etc.) []  - 0 Additional assistance / Altered mentation []  - 0 Support Surface(s) Assessment (bed, cushion, seat, etc.) INTERVENTIONS - Wound Cleansing / Measurement X - Simple Wound  Cleansing - one wound 1 5 []  - 0 Complex Wound Cleansing - multiple wounds X- 1 5 Wound Imaging (photographs - any number of wounds) []  - 0 Wound Tracing (instead of photographs) X- 1 5 Simple Wound Measurement - one wound []  - 0 Complex Wound Measurement - multiple wounds INTERVENTIONS - Wound Dressings []  - Small Wound Dressing one or multiple wounds 0 X- 1 15 Medium Wound Dressing one or multiple wounds []  - 0 Large Wound Dressing one or multiple wounds []  - 0 Application of Medications - topical []  - 0 Application of Medications - injection INTERVENTIONS - Miscellaneous []  - External ear exam 0 []  - 0 Specimen Collection (cultures, biopsies, blood, body fluids, etc.) []  - 0 Specimen(s) / Culture(s) sent or taken to Lab for analysis []  - 0 Patient Transfer (multiple staff / Civil Service fast streamer / Similar devices) []  - 0 Simple Staple / Suture removal (25 or less) []  - 0 Complex Staple / Suture removal (26 or more) []  - 0 Hypo / Hyperglycemic Management (close monitor of Blood Glucose) []  - 0 Ankle / Brachial Index (ABI) - do not check if billed separately X- 1 5 Vital Signs [name]. ([id number]) Has the patient been seen at the hospital within the last three years: Yes Total Score: 80 Level Of Care: New/Established - Level 3 Electronic Signature(s) Signed: 02/08/2018 12:27:24 PM By: [name], BSN, RN, CWS, Kim RN, BSN Entered By: [name], BSN, RN, CWS, [name] on 02/08/2018 09:57:44 [name], [name] ([id number]) -------------------------------------------------------------------------------- Encounter Discharge Information Details Patient Name: [name]. Date of Service: 02/08/2018 9:15 AM Medical Record Number: [id number] Patient Account  Number: [id number] Date of Birth/Sex: 08/22/1961 (57 y.o. M) Treating RN: [name] Primary Care Scotty Weigelt: [name] Other Clinician: Referring [name]: [name] Treating [name] Fusco/Extender: [name] in Treatment: 17 Encounter Discharge Information Items Discharge Pain Level: 0 Discharge Condition: Stable Ambulatory Status: Wheelchair Discharge Destination: Home Transportation: Private Auto Accompanied By: spouse Schedule Follow-up Appointment: Yes Medication Reconciliation completed and No provided to Patient/Care Kairon Shock: Provided on Clinical Summary of Care: 02/08/2018 Form Type Recipient Paper Patient MM Electronic Signature(s) Signed: 02/08/2018 12:43:08 PM By: Montey Hora Entered By: Montey Hora on 02/08/2018 12:43:08 Kaucher, [name] ([id number]) -------------------------------------------------------------------------------- Lower Extremity Assessment Details Patient Name: Surace, Bartt Brady. Date of Service: 02/08/2018 9:15 AM Medical Record Number: [id number] Patient Account Number: [id number] Date of Birth/Sex: 07-Oct-1961 (56 y.o. M) Treating RN: [name] Primary Care Aleksey Newbern: [name] Other Clinician: Referring Soraya Paquette: [name] Treating Santiaga Butzin/Extender: [name] in Treatment: 17 Vascular Assessment Pulses: Posterior Tibial Extremity colors, hair growth, and conditions: Extremity Color: [Right:Normal] Temperature of Extremity: [Right:Warm] Notes R BKA Electronic Signature(s) Signed: 02/10/2018 4:29:16 PM By: Alric Quan Entered By: Alric Quan on 02/08/2018 09:40:06 [name], [name]. ([id number]) -------------------------------------------------------------------------------- Multi Wound Chart Details Patient Name: [name], [name]. Date of Service: 02/08/2018 9:15 AM Medical Record Number: [id number] Patient Account Number: [id number] Date of Birth/Sex: 1961-05-13 (57 y.o.  M) Treating RN: [name] Primary Care Toniette Devera: [name] Other Clinician: Referring Emanual Lamountain: [name] Treating Fue Cervenka/Extender: [name] in Treatment: 17 Vital Signs Height(in): 71 Pulse(bpm): 79 Weight(lbs): 178 Blood Pressure(mmHg): 129/76 Body Mass Index(BMI): 25 Temperature(F): 97.9 Respiratory Rate 18 (breaths/min): Photos: [2:No Photos] [N/A:N/A] Wound Location: [2:Right Amputation Site - Below Knee] [N/A:N/A] Wounding Event: [2:Surgical Injury] [N/A:N/A] Primary Etiology: [2:Open Surgical Wound] [N/A:N/A] Comorbid History: [2:Hypertension, Peripheral Arterial Disease, Crohnos, Type II Diabetes] [N/A:N/A] Date Acquired: [2:06/27/2017] [N/A:N/A] Weeks of Treatment: [2:10] [N/A:N/A]  Wound Status: [2:Open] [N/A:N/A] Measurements L x W x D [2:0.3x0.3x0.5] [N/A:N/A] (cm) Area (cm) : [2:0.071] [N/A:N/A] Volume (cm) : [2:0.035] [N/A:N/A] % Reduction in Area: [2:74.90%] [N/A:N/A] % Reduction in Volume: [2:38.60%] [N/A:N/A] Position 1 (o'clock): [2:1] Maximum Distance 1 (cm): [2:0.5] Tunneling: [2:Yes] [N/A:N/A] Classification: [2:Full Thickness Without Exposed Support Structures] [N/A:N/A] Exudate Amount: [2:Large] [N/A:N/A] Exudate Type: [2:Purulent] [N/A:N/A] Exudate Color: [2:yellow, brown, green] [N/A:N/A] Wound Margin: [2:Flat and Intact] [N/A:N/A] Granulation Amount: [2:Large (67-100%)] [N/A:N/A] Granulation Quality: [2:Red] [N/A:N/A] Necrotic Amount: [2:Small (1-33%)] [N/A:N/A] Exposed Structures: [2:Fat Layer (Subcutaneous Tissue) Exposed: Yes Fascia: No Tendon: No Muscle: No Joint: No Bone: No] [N/A:N/A] Epithelialization: [2:None] [N/A:N/A] Periwound Skin Texture: Excoriation: Yes N/A N/A Induration: No Callus: No Crepitus: No Rash: No Scarring: No Periwound Skin Moisture: Maceration: No N/A N/A Dry/Scaly: No Periwound Skin Color: Erythema: Yes N/A N/A Atrophie Blanche: No Cyanosis: No Ecchymosis: No Hemosiderin  Staining: No Mottled: No Pallor: No Rubor: No Erythema Location: Circumferential N/A N/A Temperature: No Abnormality N/A N/A Tenderness on Palpation: No N/A N/A Wound Preparation: Ulcer Cleansing: N/A N/A Rinsed/Irrigated with Saline Topical Anesthetic Applied: Other: lidocaine 4% Treatment Notes Electronic Signature(s) Signed: 02/08/2018 5:40:08 PM By: Linton Ham MD Entered By: Linton Ham on 02/08/2018 10:05:26 Ruddy, [name] ([id number]) -------------------------------------------------------------------------------- [place] Details Patient Name: [name], [name]. Date of Service: 02/08/2018 9:15 AM Medical Record Number: [id number] Patient Account Number: [id number] Date of Birth/Sex: 12-25-1960 (57 y.o. M) Treating RN: [name] Primary Care Carma Dwiggins: [name] Other Clinician: Referring Annabella Elford: [name] Treating Jerri Glauser/Extender: [name] in Treatment: 61 Active Inactive ` Abuse / Safety / Falls / Self Care Management Nursing Diagnoses: Impaired physical mobility Goals: Patient will remain injury free related to falls Date Initiated: 10/06/2017 Target Resolution Date: 12/24/2017 Goal Status: Active Interventions: Assess fall risk on admission and as needed Notes: ` Orientation to the Wound Care Program Nursing Diagnoses: Knowledge deficit related to the wound healing center program Goals: Patient/caregiver will verbalize understanding of the [place] Program Date Initiated: 10/06/2017 Target Resolution Date: 12/24/2017 Goal Status: Active Interventions: Provide education on orientation to the wound center Notes: ` Pressure Nursing Diagnoses: Knowledge deficit related to management of pressures ulcers Goals: Patient will remain free from development of additional pressure ulcers Date Initiated: 11/10/2017 Target Resolution Date: 11/25/2017 Goal Status: Active Interventions: Placencia,  Langdon Brady. ([id number]) Assess: immobility, friction, shearing, incontinence upon admission and as needed Assess offloading mechanisms upon admission and as needed Treatment Activities: Patient referred for pressure reduction/relief devices : 11/10/2017 Notes: ` Wound/Skin Impairment Nursing Diagnoses: Impaired tissue integrity Goals: Ulcer/skin breakdown will heal within 14 weeks Date Initiated: 10/06/2017 Target Resolution Date: 12/24/2017 Goal Status: Active Interventions: Assess patient/caregiver ability to obtain necessary supplies Assess patient/caregiver ability to perform ulcer/skin care regimen upon admission and as needed Assess ulceration(s) every visit Notes: Electronic Signature(s) Signed: 02/08/2018 12:27:24 PM By: [name], BSN, RN, CWS, Kim RN, BSN Entered By: [name], BSN, RN, CWS, [name] on 02/08/2018 09:49:30 Rollinson, [name] ([id number]) -------------------------------------------------------------------------------- Pain Assessment Details Patient Name: [name], [name]. Date of Service: 02/08/2018 9:15 AM Medical Record Number: [id number] Patient Account Number: [id number] Date of Birth/Sex: [birth year] (57 y.o. M) Treating RN: [name] Primary Care Cesily Cuoco: [name] Other Clinician: Referring Gara Kincade: [name] Treating Emmilynn Marut/Extender: [name] in Treatment: 17 Active Problems Location of Pain Severity and Description of Pain Patient Has Paino Yes Site Locations Pain Location: Pain in Ulcers Rate the pain. Current Pain Level: 3 Character of Pain Describe the  Pain: Aching Pain Management and Medication Current Pain Management: Notes Topical or injectable lidocaine is offered to patient for acute pain when surgical debridement is performed. If needed, Patient is instructed to use over the counter pain medication for the following 24-48 hours after debridement. Wound care MDs do not prescribed pain medications. Patient has  chronic pain or uncontrolled pain. Patient has been instructed to make an appointment with their Primary Care Physician for pain management. Electronic Signature(s) Signed: 02/10/2018 4:29:16 PM By: Alric Quan Entered By: Alric Quan on 02/08/2018 09:31:38 [name], [name] ([id number]) -------------------------------------------------------------------------------- Patient/Caregiver Education Details Patient Name: [name], [name]. Date of Service: 02/08/2018 9:15 AM Medical Record Number: [id number] Patient Account Number: [id number] Date of Birth/Gender: Sep 29, 1961 (57 y.o. M) Treating RN: Montey Hora Primary Care Physician: [name] Other Clinician: Referring Physician: Lelon Brady Treating Physician/Extender: [name] in Treatment: 65 Education Assessment Education Provided To: Patient and Caregiver Education Topics Provided Wound/Skin Impairment: Handouts: Other: wound care as ordered Methods: Demonstration, Explain/Verbal Responses: State content correctly Electronic Signature(s) Signed: 02/08/2018 5:00:43 PM By: Montey Hora Entered By: Montey Hora on 02/08/2018 12:44:26 [name], [name] ([id number]) -------------------------------------------------------------------------------- Wound Assessment Details Patient Name: [name], [name]. Date of Service: 02/08/2018 9:15 AM Medical Record Number: [id number] Patient Account Number: [id number] Date of Birth/Sex: 30-Mar-1961 (57 y.o. M) Treating RN: [name] Primary Care Elma Shands: [name] Other Clinician: Referring Loris Winrow: [name] Treating Jacquita Mulhearn/Extender: [name] in Treatment: 17 Wound Status Wound Number: 2 Primary Open Surgical Wound Etiology: Wound Location: Right Amputation Site - Below Knee Wound Open Wounding Event: Surgical Injury Status: Date Acquired: 06/27/2017 Comorbid Hypertension, Peripheral Arterial Disease, Weeks Of Treatment:  10 History: Crohnos, Type II Diabetes Clustered Wound: No Photos Photo Uploaded By: Roger Shelter on 02/08/2018 16:54:50 Wound Measurements Length: (cm) 0.3 Width: (cm) 0.3 Depth: (cm) 0.5 Area: (cm) 0.071 Volume: (cm) 0.035 % Reduction in Area: 74.9% % Reduction in Volume: 38.6% Epithelialization: None Tunneling: Yes Position (o'clock): 1 Maximum Distance: (cm) 0.5 Undermining: No Wound Description Full Thickness Without Exposed Support Classification: Structures Wound Margin: Flat and Intact Exudate Large Amount: Exudate Type: Purulent Exudate Color: yellow, brown, green Foul Odor After Cleansing: No Slough/Fibrino Yes Wound Bed Granulation Amount: Large (67-100%) Exposed Structure Granulation Quality: Red Fascia Exposed: No Necrotic Amount: Small (1-33%) Fat Layer (Subcutaneous Tissue) Exposed: Yes Necrotic Quality: Adherent Slough Tendon Exposed: No [name], [name]. ([id number]) Muscle Exposed: No Joint Exposed: No Bone Exposed: No Periwound Skin Texture Texture Color No Abnormalities Noted: No No Abnormalities Noted: No Callus: No Atrophie Blanche: No Crepitus: No Cyanosis: No Excoriation: Yes Ecchymosis: No Induration: No Erythema: Yes Rash: No Erythema Location: Circumferential Scarring: No Hemosiderin Staining: No Mottled: No Moisture Pallor: No No Abnormalities Noted: No Rubor: No Dry / Scaly: No Maceration: No Temperature / Pain Temperature: No Abnormality Wound Preparation Ulcer Cleansing: Rinsed/Irrigated with Saline Topical Anesthetic Applied: Other: lidocaine 4%, Treatment Notes Wound #2 (Right Amputation Site - Below Knee) 1. Cleansed with: Clean wound with Normal Saline 2. Anesthetic Topical Lidocaine 4% cream to wound bed prior to debridement 4. Dressing Applied: Other dressing (specify in notes) 5. Secondary Dressing Applied Bordered Foam Dressing Notes [employer] Electronic Signature(s) Signed: 02/10/2018  4:29:16 PM By: Alric Quan Entered By: Alric Quan on 02/08/2018 09:38:47 Dondlinger, [name] ([id number]) -------------------------------------------------------------------------------- [place] Details Patient Name: [name], [name]. Date of Service: 02/08/2018 9:15 AM Medical Record Number: [id number] Patient Account Number: [id number] Date of Birth/Sex: 02/16/1961 (57 y.o. M) Treating RN: [name] Primary  Care Sanae Willetts: [name] Other Clinician: Referring Loreal Schuessler: [name] Treating Holdan Stucke/Extender: [name] in Treatment: 17 Vital Signs Time Taken: 09:31 Temperature (F): 97.9 Height (in): 71 Pulse (bpm): 62 Weight (lbs): 178 Respiratory Rate (breaths/min): 18 Body Mass Index (BMI): 24.8 Blood Pressure (mmHg): 129/76 Reference Range: 80 - 120 mg / dl Electronic Signature(s) Signed: 02/10/2018 4:29:16 PM By: Alric Quan Entered By: Alric Quan on 02/08/2018 09:33:19

## 2018-02-16 ENCOUNTER — Encounter: Payer: BLUE CROSS/BLUE SHIELD | Attending: Nurse Practitioner | Admitting: Nurse Practitioner

## 2018-02-16 DIAGNOSIS — Z87891 Personal history of nicotine dependence: Secondary | ICD-10-CM | POA: Insufficient documentation

## 2018-02-16 DIAGNOSIS — X58XXXA Exposure to other specified factors, initial encounter: Secondary | ICD-10-CM | POA: Insufficient documentation

## 2018-02-16 DIAGNOSIS — Z8673 Personal history of transient ischemic attack (TIA), and cerebral infarction without residual deficits: Secondary | ICD-10-CM | POA: Insufficient documentation

## 2018-02-16 DIAGNOSIS — Z888 Allergy status to other drugs, medicaments and biological substances status: Secondary | ICD-10-CM | POA: Insufficient documentation

## 2018-02-16 DIAGNOSIS — N186 End stage renal disease: Secondary | ICD-10-CM | POA: Diagnosis not present

## 2018-02-16 DIAGNOSIS — Z89511 Acquired absence of right leg below knee: Secondary | ICD-10-CM | POA: Insufficient documentation

## 2018-02-16 DIAGNOSIS — L732 Hidradenitis suppurativa: Secondary | ICD-10-CM | POA: Diagnosis not present

## 2018-02-16 DIAGNOSIS — E11622 Type 2 diabetes mellitus with other skin ulcer: Secondary | ICD-10-CM | POA: Diagnosis not present

## 2018-02-16 DIAGNOSIS — L97919 Non-pressure chronic ulcer of unspecified part of right lower leg with unspecified severity: Secondary | ICD-10-CM | POA: Diagnosis not present

## 2018-02-16 DIAGNOSIS — Z992 Dependence on renal dialysis: Secondary | ICD-10-CM | POA: Diagnosis not present

## 2018-02-16 DIAGNOSIS — Z794 Long term (current) use of insulin: Secondary | ICD-10-CM | POA: Insufficient documentation

## 2018-02-16 DIAGNOSIS — I120 Hypertensive chronic kidney disease with stage 5 chronic kidney disease or end stage renal disease: Secondary | ICD-10-CM | POA: Diagnosis not present

## 2018-02-16 DIAGNOSIS — E1122 Type 2 diabetes mellitus with diabetic chronic kidney disease: Secondary | ICD-10-CM | POA: Diagnosis not present

## 2018-02-16 DIAGNOSIS — T8789 Other complications of amputation stump: Secondary | ICD-10-CM | POA: Insufficient documentation

## 2018-02-16 DIAGNOSIS — Z881 Allergy status to other antibiotic agents status: Secondary | ICD-10-CM | POA: Insufficient documentation

## 2018-02-20 ENCOUNTER — Telehealth: Payer: Self-pay | Admitting: Family Medicine

## 2018-02-20 MED ORDER — GLUCOSE BLOOD VI STRP: ORAL_STRIP | 12 refills | Status: DC

## 2018-02-20 MED ORDER — ONETOUCH ULTRA 2 W/DEVICE KIT: PACK | 0 refills | Status: AC

## 2018-02-20 MED ORDER — ONETOUCH LANCETS MISC: 11 refills | Status: DC

## 2018-02-20 NOTE — Telephone Encounter (Signed)
Pharmacy needs rx for One touch ultra meter, lancets and strips

## 2018-02-21 NOTE — Progress Notes (Signed)
Gauna, KACEN MELLINGER ([id number]) Visit Report for 02/16/2018 Arrival Information Details Patient Name: Pinson, Yehia B. Date of Service: 02/16/2018 10:00 AM Medical Record Number: [id number] Patient Account Number: [id number] Date of Birth/Sex: Nov 26, 1960 (57 y.o. M) Treating RN: Roger Shelter Primary Care Rudolf Blizard: [name] Other Clinician: Referring Azrael Maddix: [name] Treating Abagale Boulos/Extender: Cathie Olden in Treatment: 74 Visit Information History Since Last Visit All ordered tests and consults were completed: No Patient Arrived: Wheel Chair Added or deleted any medications: No Arrival Time: 10:31 Any new allergies or adverse reactions: No Accompanied By: wife Had a fall or experienced change in No Transfer Assistance: EasyPivot Patient activities of daily living that may affect Lift risk of falls: Patient Identification Verified: Yes Signs or symptoms of abuse/neglect since last visito No Secondary Verification Process Yes Hospitalized since last visit: No Completed: Implantable device outside of the clinic excluding No Patient Requires Transmission-Based No cellular tissue based products placed in the center Precautions: since last visit: Patient Has Alerts: Yes Pain Present Now: No Patient Alerts: DMII Electronic Signature(s) Signed: 02/16/2018 12:42:09 PM By: Roger Shelter Entered By: Roger Shelter on 02/16/2018 10:32:15 Standing, Toribio Harbour ([id number]) -------------------------------------------------------------------------------- Encounter Discharge Information Details Patient Name: Thalmann, Dartanian B. Date of Service: 02/16/2018 10:00 AM Medical Record Number: [id number] Patient Account Number: [id number] Date of Birth/Sex: 1961/09/15 (57 y.o. M) Treating RN: Roger Shelter Primary Care Zalia Hautala: [name] Other Clinician: Referring Kourtney Terriquez: [name] Treating Jaquia Benedicto/Extender: Cathie Olden in Treatment: 73 Encounter  Discharge Information Items Discharge Pain Level: 0 Discharge Condition: Stable Ambulatory Status: Wheelchair Discharge Destination: Home Transportation: Private Auto Accompanied By: wife Schedule Follow-up Appointment: Yes Medication Reconciliation completed and No provided to Patient/Care Mililani Murthy: Provided on Clinical Summary of Care: 02/16/2018 Form Type Recipient Paper Patient MM Electronic Signature(s) Signed: 02/16/2018 2:11:15 PM By: Ruthine Dose Entered By: Ruthine Dose on 02/16/2018 10:57:14 Molenda, Toribio Harbour ([id number]) -------------------------------------------------------------------------------- Lower Extremity Assessment Details Patient Name: Sampley, Kaydyn B. Date of Service: 02/16/2018 10:00 AM Medical Record Number: [id number] Patient Account Number: [id number] Date of Birth/Sex: 1961/03/30 (57 y.o. M) Treating RN: Roger Shelter Primary Care Ellary Casamento: [name] Other Clinician: Referring Shardea Cwynar: [name] Treating Geramy Lamorte/Extender: Lawanda Cousins Weeks in Treatment: 19 Electronic Signature(s) Signed: 02/16/2018 12:42:09 PM By: Roger Shelter Entered By: Roger Shelter on 02/16/2018 10:36:09 Shumard, Drako BMarland Kitchen ([id number]) -------------------------------------------------------------------------------- Multi Wound Chart Details Patient Name: Balzarini, Lavell B. Date of Service: 02/16/2018 10:00 AM Medical Record Number: [id number] Patient Account Number: [id number] Date of Birth/Sex: 1961/09/28 (57 y.o. M) Treating RN: [name] Primary Care Nadina Fomby: [name] Other Clinician: Referring Saraih Lorton: [name] Treating Kamaiya Antilla/Extender: Cathie Olden in Treatment: 19 Vital Signs Height(in): 71 Pulse(bpm): 65 Weight(lbs): 178 Blood Pressure(mmHg): 136/74 Body Mass Index(BMI): 25 Temperature(F): 98.0 Respiratory Rate 18 (breaths/min): Photos: [2:No Photos] [N/A:N/A] Wound Location: [2:Right Amputation Site - Below Knee]  [N/A:N/A] Wounding Event: [2:Surgical Injury] [N/A:N/A] Primary Etiology: [2:Open Surgical Wound] [N/A:N/A] Comorbid History: [2:Hypertension, Peripheral Arterial Disease, Crohnos, Type II Diabetes] [N/A:N/A] Date Acquired: [2:06/27/2017] [N/A:N/A] Weeks of Treatment: [2:12] [N/A:N/A] Wound Status: [2:Open] [N/A:N/A] Measurements L x W x D [2:0.4x0.4x0.6] [N/A:N/A] (cm) Area (cm) : [2:0.126] [N/A:N/A] Volume (cm) : [2:0.075] [N/A:N/A] % Reduction in Area: [2:55.50%] [N/A:N/A] % Reduction in Volume: [2:-31.60%] [N/A:N/A] Classification: [2:Full Thickness Without Exposed Support Structures] [N/A:N/A] Exudate Amount: [2:Medium] [N/A:N/A] Exudate Type: [2:Serous] [N/A:N/A] Exudate Color: [2:amber] [N/A:N/A] Wound Margin: [2:Flat and Intact] [N/A:N/A] Granulation Amount: [2:Large (67-100%)] [N/A:N/A] Granulation Quality: [2:Red] [N/A:N/A] Necrotic Amount: [2:Small (1-33%)] [N/A:N/A] Exposed Structures: [2:Fat Layer (Subcutaneous Tissue) Exposed: Yes Fascia:  No Tendon: No Muscle: No Joint: No Bone: No] [N/A:N/A] Epithelialization: [2:None] [N/A:N/A] Debridement: [2:Debridement - Excisional] [N/A:N/A] Pre-procedure [2:10:41] [N/A:N/A] Verification/Time Out Taken: Chevez, Alicia B. ([id number]) Pain Control: Lidocaine 4% Topical Solution N/A N/A Tissue Debrided: Subcutaneous, Slough N/A N/A Level: Skin/Subcutaneous Tissue N/A N/A Debridement Area (sq cm): 0.16 N/A N/A Instrument: Curette N/A N/A Bleeding: Minimum N/A N/A Hemostasis Achieved: Pressure N/A N/A Procedural Pain: 0 N/A N/A Post Procedural Pain: 0 N/A N/A Debridement Treatment Procedure was tolerated well N/A N/A Response: Post Debridement 0.4x0.4x0.7 N/A N/A Measurements L x W x D (cm) Post Debridement Volume: 0.088 N/A N/A (cm) Periwound Skin Texture: Excoriation: Yes N/A N/A Induration: No Callus: No Crepitus: No Rash: No Scarring: No Periwound Skin Moisture: Maceration: No N/A N/A Dry/Scaly:  No Periwound Skin Color: Atrophie Blanche: No N/A N/A Cyanosis: No Ecchymosis: No Erythema: No Hemosiderin Staining: No Mottled: No Pallor: No Rubor: No Temperature: No Abnormality N/A N/A Tenderness on Palpation: No N/A N/A Wound Preparation: Ulcer Cleansing: N/A N/A Rinsed/Irrigated with Saline Topical Anesthetic Applied: Other: lidocaine 4% Procedures Performed: Debridement N/A N/A Treatment Notes Electronic Signature(s) Signed: 02/16/2018 10:50:25 AM By: Lawanda Cousins Entered By: Lawanda Cousins on 02/16/2018 10:50:25 Brocksmith, Toribio Harbour ([id number]) -------------------------------------------------------------------------------- [place] Details Patient Name: Morocho, Toa B. Date of Service: 02/16/2018 10:00 AM Medical Record Number: [id number] Patient Account Number: [id number] Date of Birth/Sex: 1961-07-18 (57 y.o. M) Treating RN: [name] Primary Care Jaymason Ledesma: [name] Other Clinician: Referring Kellie Chisolm: [name] Treating Shereta Crothers/Extender: Cathie Olden in Treatment: 65 Active Inactive ` Abuse / Safety / Falls / Self Care Management Nursing Diagnoses: Impaired physical mobility Goals: Patient will remain injury free related to falls Date Initiated: 10/06/2017 Target Resolution Date: 12/24/2017 Goal Status: Active Interventions: Assess fall risk on admission and as needed Notes: ` Orientation to the Wound Care Program Nursing Diagnoses: Knowledge deficit related to the wound healing center program Goals: Patient/caregiver will verbalize understanding of the [facility] Date Initiated: 10/06/2017 Target Resolution Date: 12/24/2017 Goal Status: Active Interventions: Provide education on orientation to the wound center Notes: ` Pressure Nursing Diagnoses: Knowledge deficit related to management of pressures ulcers Goals: Patient will remain free from development of additional pressure ulcers Date  Initiated: 11/10/2017 Target Resolution Date: 11/25/2017 Goal Status: Active Interventions: Gubbels, Johnathon B. ([id number]) Assess: immobility, friction, shearing, incontinence upon admission and as needed Assess offloading mechanisms upon admission and as needed Treatment Activities: Patient referred for pressure reduction/relief devices : 11/10/2017 Notes: ` Wound/Skin Impairment Nursing Diagnoses: Impaired tissue integrity Goals: Ulcer/skin breakdown will heal within 14 weeks Date Initiated: 10/06/2017 Target Resolution Date: 12/24/2017 Goal Status: Active Interventions: Assess patient/caregiver ability to obtain necessary supplies Assess patient/caregiver ability to perform ulcer/skin care regimen upon admission and as needed Assess ulceration(s) every visit Notes: Electronic Signature(s) Signed: 02/17/2018 4:40:49 PM By: Alric Quan Entered By: Alric Quan on 02/16/2018 10:39:59 Vidrio, Leona B. ([id number]) -------------------------------------------------------------------------------- Pain Assessment Details Patient Name: Knudtson, Amos B. Date of Service: 02/16/2018 10:00 AM Medical Record Number: [id number] Patient Account Number: [id number] Date of Birth/Sex: 1961/03/08 (57 y.o. M) Treating RN: Roger Shelter Primary Care Elisabetta Mishra: [name] Other Clinician: Referring Dwyne Hasegawa: [name] Treating Jakolby Sedivy/Extender: Cathie Olden in Treatment: 81 Active Problems Location of Pain Severity and Description of Pain Patient Has Paino No Site Locations Pain Management and Medication Current Pain Management: Electronic Signature(s) Signed: 02/16/2018 12:42:09 PM By: Roger Shelter Entered By: Roger Shelter on 02/16/2018 10:32:23 Pattison, Toribio Harbour ([id number]) -------------------------------------------------------------------------------- Patient/Caregiver Education Details Patient Name:  Abid, Marquese B. Date of Service: 02/16/2018 10:00  AM Medical Record Number: [id number] Patient Account Number: [id number] Date of Birth/Gender: 25-Sep-1961 (57 y.o. M) Treating RN: Roger Shelter Primary Care Physician: [name] Other Clinician: Referring Physician: [name] Treating Physician/Extender: Cathie Olden in Treatment: 55 Education Assessment Education Provided To: Patient Education Topics Provided Wound Debridement: Handouts: Wound Debridement Methods: Explain/Verbal Responses: State content correctly Wound/Skin Impairment: Handouts: Caring for Your Ulcer Methods: Explain/Verbal Responses: State content correctly Electronic Signature(s) Signed: 02/16/2018 12:42:09 PM By: Roger Shelter Entered By: Roger Shelter on 02/16/2018 10:56:50 Roan, Ladarrious B. ([id number]) -------------------------------------------------------------------------------- Wound Assessment Details Patient Name: Latulippe, Lathaniel B. Date of Service: 02/16/2018 10:00 AM Medical Record Number: [id number] Patient Account Number: [id number] Date of Birth/Sex: [birth year] (57 y.o. M) Treating RN: Roger Shelter Primary Care Juli Odom: [name] Other Clinician: Referring Bilbo Carcamo: [name] Treating Trula Frede/Extender: Cathie Olden in Treatment: 19 Wound Status Wound Number: 2 Primary Open Surgical Wound Etiology: Wound Location: Right Amputation Site - Below Knee Wound Open Wounding Event: Surgical Injury Status: Date Acquired: 06/27/2017 Comorbid Hypertension, Peripheral Arterial Disease, Weeks Of Treatment: 12 History: Crohnos, Type II Diabetes Clustered Wound: No Photos Photo Uploaded By: Roger Shelter on 02/16/2018 12:43:46 Wound Measurements Length: (cm) 0.4 Width: (cm) 0.4 Depth: (cm) 0.6 Area: (cm) 0.126 Volume: (cm) 0.075 % Reduction in Area: 55.5% % Reduction in Volume: -31.6% Epithelialization: None Tunneling: No Undermining: No Wound Description Full Thickness Without Exposed  Support Classification: Structures Wound Margin: Flat and Intact Exudate Medium Amount: Exudate Type: Serous Exudate Color: amber Foul Odor After Cleansing: No Slough/Fibrino Yes Wound Bed Granulation Amount: Large (67-100%) Exposed Structure Granulation Quality: Red Fascia Exposed: No Necrotic Amount: Small (1-33%) Fat Layer (Subcutaneous Tissue) Exposed: Yes Necrotic Quality: Adherent Slough Tendon Exposed: No Muscle Exposed: No Joint Exposed: No Bone Exposed: No Tep, Lejuan B. ([id number]) Periwound Skin Texture Texture Color No Abnormalities Noted: No No Abnormalities Noted: No Callus: No Atrophie Blanche: No Crepitus: No Cyanosis: No Excoriation: Yes Ecchymosis: No Induration: No Erythema: No Rash: No Hemosiderin Staining: No Scarring: No Mottled: No Pallor: No Moisture Rubor: No No Abnormalities Noted: No Dry / Scaly: No Temperature / Pain Maceration: No Temperature: No Abnormality Wound Preparation Ulcer Cleansing: Rinsed/Irrigated with Saline Topical Anesthetic Applied: Other: lidocaine 4%, Treatment Notes Wound #2 (Right Amputation Site - Below Knee) 1. Cleansed with: Clean wound with Normal Saline 2. Anesthetic Topical Lidocaine 4% cream to wound bed prior to debridement 4. Dressing Applied: Prisma Ag 5. Secondary Dressing Applied Bordered Foam Dressing Notes silvercel over the prisma Electronic Signature(s) Signed: 02/16/2018 12:42:09 PM By: Roger Shelter Entered By: Roger Shelter on 02/16/2018 10:35:22 Raphael, Toribio Harbour ([id number]) -------------------------------------------------------------------------------- [place] Details Patient Name: Welke, Sheamus B. Date of Service: 02/16/2018 10:00 AM Medical Record Number: [id number] Patient Account Number: [id number] Date of Birth/Sex: [birth year] (57 y.o. M) Treating RN: Roger Shelter Primary Care Skye Rodarte: [name] Other Clinician: Referring Jamaiyah Pyle: [name] Treating  Aleyna Cueva/Extender: Cathie Olden in Treatment: 30 Vital Signs Time Taken: 10:32 Temperature (F): 98.0 Height (in): 71 Pulse (bpm): 58 Weight (lbs): 178 Respiratory Rate (breaths/min): 18 Body Mass Index (BMI): 24.8 Blood Pressure (mmHg): 136/74 Reference Range: 80 - 120 mg / dl Electronic Signature(s) Signed: 02/16/2018 12:42:09 PM By: Roger Shelter Entered By: Roger Shelter on 02/16/2018 10:32:40

## 2018-02-21 NOTE — Progress Notes (Signed)
[name] ([id number]) Visit Report for 02/16/2018 Chief Complaint Document Details Patient Name: Brady, [name] Date of Service: 02/16/2018 10:00 AM Medical Record Number: [id number] Patient Account Number: [id number] Date of Birth/Sex: 1961/05/15 (57 y.o. M) Treating RN: [name] Primary Care Provider: [name] Other Clinician: Referring Provider: [name] Treating Provider/Extender: Cathie Olden in Treatment: 56 Information Obtained from: Patient Chief Complaint He is here for evaluation of right bka stump ulcer Electronic Signature(s) Signed: 02/16/2018 10:51:59 AM By: Lawanda Cousins Entered By: Lawanda Cousins on 02/16/2018 10:51:58 [name] ([id number]) -------------------------------------------------------------------------------- Debridement Details Patient Name: Brady, [name] Date of Service: 02/16/2018 10:00 AM Medical Record Number: [id number] Patient Account Number: [id number] Date of Birth/Sex: [birth year] (57 y.o. M) Treating RN: [name] Primary Care Provider: [name] Other Clinician: Referring Provider: [name] Treating Provider/Extender: Cathie Olden in Treatment: 19 Debridement Performed for Wound #2 Right Amputation Site - Below Knee Assessment: Performed By: Physician Lawanda Cousins, NP Debridement Type: Debridement Pre-procedure Verification/Time Yes - 10:41 Out Taken: Start Time: 10:41 Pain Control: Lidocaine 4% Topical Solution Total Area Debrided (L x W): 0.4 (cm) x 0.4 (cm) = 0.16 (cm) Tissue and other material Viable, Non-Viable, Slough, Subcutaneous, Fibrin/Exudate, Slough debrided: Level: Skin/Subcutaneous Tissue Debridement Description: Excisional Instrument: Blade Bleeding: Minimum Hemostasis Achieved: Pressure End Time: 10:43 Procedural Pain: 0 Post Procedural Pain: 0 Response to Treatment: Procedure was tolerated well Post Debridement Measurements of Total Wound Length:  (cm) 0.4 Width: (cm) 0.4 Depth: (cm) 0.7 Volume: (cm) 0.088 Character of Wound/Ulcer Post Debridement: Requires Further Debridement Post Procedure Diagnosis Same as Pre-procedure Electronic Signature(s) Signed: 02/16/2018 10:50:53 AM By: Lawanda Cousins Signed: 02/17/2018 4:40:49 PM By: Alric Quan Entered By: Lawanda Cousins on 02/16/2018 10:50:53 [name] ([id number]) -------------------------------------------------------------------------------- HPI Details Patient Name: Brady, [name] Date of Service: 02/16/2018 10:00 AM Medical Record Number: [id number] Patient Account Number: [id number] Date of Birth/Sex: 03/20/1961 (57 y.o. M) Treating RN: [name] Primary Care Provider: [name] Other Clinician: Referring Provider: [name] Treating Provider/Extender: Cathie Olden in Treatment: 56 History of Present Illness Location: Patient has a ulcer to Sacrum Quality: denies pain Severity: denies pain Duration: months, but unable to clearly articulate timeframe Context: ulcer occurred secondary to pressure HPI Description: 10-06-17 he is here for initial evaluation of the sacral ulcer. He is unable to articulate exactly this is a states "months". 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 He is currently in a facility, [facility]. He is unsure if he has a pressure reduction mattress. He admits to sitting the majority of the day and sleeping on his back. We discussed the need for lateral sleeping, which he states he is able to do. He voices no complaints of pain, discomfort, fever, chills, ill feeling. He tolerated debridement. 10/26/17 on evaluation today patient sacral wound appears to be doing excellent. He has been tolerating the dressing changes without complication. He does have some discomfort especially when he has to sit in the chair at dialysis for an extended period of time. With that being  said other than that he tries to stay offloaded as much as possible and I think he has done a very good job in that regard. There does not appear to be any new pressure injury. 11/10/17 He is here in follow up evaluation of a sacral ulcer. He is now home, have been using prisma. He presents with essentially unchanged measurements and increased in  nonviable tissue. Prior to prisma they were using santyl, will transition back to santyl (spouse to call clinic if they do not have santyl at home). We discussed more aggressive offloading as he continues to sleep on his back, improved diet as he tends to eat "junk food". He does use a pressure redistribution cushion while in dialysis. Follow up in two weeks per their preference. 11/24/17-he is here in follow-up evaluation for a sacral ulcer and dates that his vascular surgeon at [facility] wants to "know if we can use the same medicine" on the BKA stump. Patient's bowel did contact the office, they do not have Santyl at home as originally thought. We will initiate Santyl to both wounds today. He states he does "the best I can" as far as repositioning but has inconsistent offloading while sleeping. He does use the chair cushion while in dialysis. Continue with follow-up in 2 weeks per their preference 12/22/17-he is here in follow-up evaluation for a sacral ulcer and right BKA stump ulcer. He was hospitalized at [facility] regional from 2/17-2/25 for sepsis due to right groin abscess, but her surgical consult note this is hidradenitis and required no surgical intervention. He was treated on the inpatient side with daptomycin. He was discharged on doxycycline which he has completed. he presents today with a healed sacral ulcer. He has a follow-up appointment at [facility] in a couple of weeks regarding the right BKA stump. He will follow-up in 2 weeks per his preference. 01/05/18-He is here in follow up evaluation for right BKA stump ulcer. There is improvement of this ulcer and  we will continue with Santyl. He has seen vascular medicine at [facility] regarding this with no changes in treatment plan. He does have a history of hidradenitis and is following Dr. Otho Ket at [facility] dermatology which he saw Monday. He notes increased pain but no change in drainage to his groin. We will culture this today and hold off on antibiotic therapy until sensitivities resulted; he see Dr Otho Ket in three weeks 01/19/18-He is here in follow-up evaluation for right BKA stump. There is improvement, we will change to Prisma. The culture that was obtained from his right groin last week grew Enterococcus faecalis; Monday he was started on Augmentin. He states he is tolerating the Augmentin with no adverse GI effects. He has no appointment later this month with dermatology. He will follow-up here in 2 weeks 02/02/18-He is here in preparation for right BKA stump ulcer. There is minimal improvement but we will continue with Prisma. He does have a follow-up appointment with vascular medicine this afternoon. His appointment with dermatology as in June not later this month as he originally thought. He does note improvement in drainage from his right groin/hidradenitis. He will follow-up in 2 weeks per his preference. 02/08/18; he was brought in earlier today to see [place] with regards to concerns raised by his wife about purulent drainage coming out of the stump area around the wound that she noted on Sunday for the first time. He is not complaining of pain. There are Lipford, Bernal B. ([id number]) no systemic symptoms. He was on Augmentin earlier this week for Enterococcus faecalis that was cultured from his right groin. Purulent drainage also noted by our intake nurse when the dressing was changed 02/16/18 He is here for follow up evaluation of right BKA stump. He had to be seen last week for increased erythema, edema and pain. Culture was obtained and he was initiated on doxycycline; culture grew oxacillin sensitive  staph  aureus. He will complete his doxycycline tomorrow morning. He is voicing no complaints or concerns. We will treat with collagen to the base and silvercel to fill. He will follow up in two weeks. Electronic Signature(s) Signed: 02/16/2018 11:44:57 AM By: Lawanda Cousins Entered By: Lawanda Cousins on 02/16/2018 11:44:57 [name] ([id number]) -------------------------------------------------------------------------------- Physician Orders Details Patient Name: Falk, Jarad B. Date of Service: 02/16/2018 10:00 AM Medical Record Number: [id number] Patient Account Number: [id number] Date of Birth/Sex: 03-24-1961 (57 y.o. M) Treating RN: [name] Primary Care Provider: [name] Other Clinician: Referring Provider: [name] Treating Provider/Extender: Cathie Olden in Treatment: 10 Verbal / Phone Orders: Yes Clinician: Carolyne Fiscal, Debi Read Back and Verified: Yes Diagnosis Coding Wound Cleansing Wound #2 Right Amputation Site - Below Knee o Clean wound with Normal Saline. Anesthetic (add to Medication List) Wound #2 Right Amputation Site - Below Knee o Topical Lidocaine 4% cream applied to wound bed prior to debridement (In Clinic Only). Skin Barriers/Peri-Wound Care Wound #2 Right Amputation Site - Below Knee o Skin Prep Primary Wound Dressing Wound #2 Right Amputation Site - Below Knee o Silver Alginate - place in wound o Silver Collagen - lightly moisten with saline place in the base of wound Secondary Dressing Wound #2 Right Amputation Site - Below Knee o Boardered Foam Dressing Dressing Change Frequency Wound #2 Right Amputation Site - Below Knee o Change dressing every day. Follow-up Appointments Wound #2 Right Amputation Site - Below Knee o Return Appointment in 1 week. Medications-please add to medication list. Wound #2 Right Amputation Site - Below Knee o P.O. Antibiotics - Doxycycline Patient Medications Allergies:  adhesive, cefepime, methotrexate, silicon, vancomycin Notifications Medication Indication Start End lidocaine DOSE 1 - topical 4 % cream - 1 cream topical Nawrot, Kha B. ([id number]) Electronic Signature(s) Signed: 02/16/2018 12:54:19 PM By: Lawanda Cousins Signed: 02/17/2018 4:40:49 PM By: Alric Quan Entered By: Alric Quan on 02/16/2018 10:48:08 [name] ([id number]) -------------------------------------------------------------------------------- Prescription 02/16/2018 Patient Name: [name], [name] Provider: Lawanda Cousins NP Date of Birth: 08-Feb-1961 NPI#: [id number] Sex: Jerilynn Mages DEA#: [id number] Phone #: [phone] License #: Patient Address: [facility] [place], [place] [address] [address] [place], [place] [address] [phone] Allergies adhesive cefepime methotrexate silicon vancomycin Medication Medication: Route: Strength: Form: lidocaine 4 % topical cream topical 4% cream Class: TOPICAL LOCAL ANESTHETICS Dose: Frequency / Time: Indication: 1 1 cream topical Number of Refills: Number of Units: 0 Generic Substitution: Start Date: End Date: One Time Use: Substitution Permitted No Note to Pharmacy: Signature(s): Date(s): Brady, [name] VOWELS ([id number]) Electronic Signature(s) Signed: 02/16/2018 12:54:19 PM By: Lawanda Cousins Signed: 02/17/2018 4:40:49 PM By: Alric Quan Entered By: Alric Quan on 02/16/2018 10:48:08 [name] ([id number]) --------------------------------------------------------------------------------  Problem List Details Patient Name: Crosland, Cesare B. Date of Service: 02/16/2018 10:00 AM Medical Record Number: [id number] Patient Account Number: [id number] Date of Birth/Sex: Jul 17, 1961 (57 y.o. M) Treating RN: [name] Primary Care Provider: [name] Other Clinician: Referring Provider: [name] Treating Provider/Extender: Cathie Olden in Treatment: 32 Active Problems ICD-10 Impacting Encounter Code Description Active Date Wound Healing Diagnosis S81.801S Unspecified open wound, right lower leg, sequela 11/24/2017 Yes Z89.511 Acquired absence of right leg below knee 10/06/2017 Yes N18.6 End stage renal disease 10/06/2017 Yes E11.622 Type 2 diabetes mellitus with other skin ulcer 10/06/2017 Yes T81.31XS Disruption of external operation (surgical) wound, not 11/24/2017 Yes elsewhere classified, sequela L73.2 Hidradenitis suppurativa 01/05/2018 Yes Inactive Problems  Resolved Problems ICD-10 Code Description Active Date Resolved Date L89.153 Pressure ulcer of sacral region, stage 3 10/06/2017 12/22/2017 Electronic Signature(s) Signed: 02/16/2018 10:49:57 AM By: Lawanda Cousins Entered By: Lawanda Cousins on 02/16/2018 10:49:57 Brady, [name] ([id number]) -------------------------------------------------------------------------------- Progress Note Details Patient Name: Coury, Khali B. Date of Service: 02/16/2018 10:00 AM Medical Record Number: [id number] Patient Account Number: [id number] Date of Birth/Sex: 06/26/1961 (57 y.o. M) Treating RN: [name] Primary Care Provider: [name] Other Clinician: Referring Provider: [name] Treating Provider/Extender: Cathie Olden in Treatment: 73 Subjective Chief Complaint Information obtained from Patient He is here for evaluation of right bka stump ulcer History of Present Illness (HPI) The following HPI elements were documented for the patient's wound: Location: Patient has a ulcer to Sacrum Quality: denies pain Severity: denies pain Duration: months, but unable to clearly articulate timeframe Context: ulcer occurred secondary to pressure 10-06-17 he is here for initial evaluation of the sacral ulcer. He is unable to articulate exactly this is a states "months". He had a right BKA in October and  is unaware if the pressure injury was there at that time. He is currently in a facility, [facility]. He is unsure if he has a pressure reduction mattress. He admits to sitting the majority of the day and sleeping on his back. We discussed the need for lateral sleeping, which he states he is able to do. He voices no complaints of pain, discomfort, fever, chills, ill feeling. He tolerated debridement. 10/26/17 on evaluation today patient sacral wound appears to be doing excellent. He has been tolerating the dressing changes without complication. He does have some discomfort especially when he has to sit in the chair at dialysis for an extended period of time. With that being said other than that he tries to stay offloaded as much as possible and I think he has done a very good job in that regard. There does not appear to be any new pressure injury. 11/10/17 He is here in follow up evaluation of a sacral ulcer. He is now home, have been using prisma. He presents with essentially unchanged measurements and increased in nonviable tissue. Prior to prisma they were using santyl, will transition back to santyl (spouse to call clinic if they do not have santyl at home). We discussed more aggressive offloading as he continues to sleep on his back, improved diet as he tends to eat "junk food". He does use a pressure redistribution cushion while in dialysis. Follow up in two weeks per their preference. 11/24/17-he is here in follow-up evaluation for a sacral ulcer and dates that his vascular surgeon at [facility] wants to "know if we can use the same medicine" on the BKA stump. Patient's bowel did contact the office, they do not have Santyl at home as originally thought. We will initiate Santyl to both wounds today. He states he does "the best I can" as far as repositioning but has inconsistent offloading while sleeping. He does use the chair cushion while in dialysis. Continue with follow-up in 2 weeks per their  preference 12/22/17-he is here in follow-up evaluation for a sacral ulcer and right BKA stump ulcer. He was hospitalized at [facility] regional from 2/17-2/25 for sepsis due to right groin abscess, but her surgical consult note this is hidradenitis and required no surgical intervention. He was treated on the inpatient side with daptomycin. He was discharged on doxycycline which he has completed. he presents today with a healed sacral ulcer. He has a follow-up appointment at [facility]  in a couple of weeks regarding the right BKA stump. He will follow-up in 2 weeks per his preference. 01/05/18-He is here in follow up evaluation for right BKA stump ulcer. There is improvement of this ulcer and we will continue with Santyl. He has seen vascular medicine at [facility] regarding this with no changes in treatment plan. He does have a history of hidradenitis and is following Dr. Otho Ket at [facility] dermatology which he saw Monday. He notes increased pain but no change in drainage to his groin. We will culture this today and hold off on antibiotic therapy until sensitivities resulted; he see Dr Otho Ket in three weeks 01/19/18-He is here in follow-up evaluation for right BKA stump. There is improvement, we will change to Prisma. The culture that was obtained from his right groin last week grew Enterococcus faecalis; Monday he was started on Augmentin. He states Brady, [name] ([id number]) he is tolerating the Augmentin with no adverse GI effects. He has no appointment later this month with dermatology. He will follow-up here in 2 weeks 02/02/18-He is here in preparation for right BKA stump ulcer. There is minimal improvement but we will continue with Prisma. He does have a follow-up appointment with vascular medicine this afternoon. His appointment with dermatology as in June not later this month as he originally thought. He does note improvement in drainage from his right groin/hidradenitis. He will follow-up in 2 weeks per his  preference. 02/08/18; he was brought in earlier today to see [place] with regards to concerns raised by his wife about purulent drainage coming out of the stump area around the wound that she noted on Sunday for the first time. He is not complaining of pain. There are no systemic symptoms. He was on Augmentin earlier this week for Enterococcus faecalis that was cultured from his right groin. Purulent drainage also noted by our intake nurse when the dressing was changed 02/16/18 He is here for follow up evaluation of right BKA stump. He had to be seen last week for increased erythema, edema and pain. Culture was obtained and he was initiated on doxycycline; culture grew oxacillin sensitive staph aureus. He will complete his doxycycline tomorrow morning. He is voicing no complaints or concerns. We will treat with collagen to the base and silvercel to fill. He will follow up in two weeks. Objective Constitutional Vitals Time Taken: 10:32 AM, Height: 71 in, Weight: 178 lbs, BMI: 24.8, Temperature: 98.0 F, Pulse: 58 bpm, Respiratory Rate: 18 breaths/min, Blood Pressure: 136/74 mmHg. Integumentary (Hair, Skin) Wound #2 status is Open. Original cause of wound was Surgical Injury. The wound is located on the Right Amputation Site - Below Knee. The wound measures 0.4cm length x 0.4cm width x 0.6cm depth; 0.126cm^2 area and 0.075cm^3 volume. There is Fat Layer (Subcutaneous Tissue) Exposed exposed. There is no tunneling or undermining noted. There is a medium amount of serous drainage noted. The wound margin is flat and intact. There is large (67-100%) red granulation within the wound bed. There is a small (1-33%) amount of necrotic tissue within the wound bed including Adherent Slough. The periwound skin appearance exhibited: Excoriation. The periwound skin appearance did not exhibit: Callus, Crepitus, Induration, Rash, Scarring, Dry/Scaly, Maceration, Atrophie Blanche, Cyanosis, Ecchymosis, Hemosiderin  Staining, Mottled, Pallor, Rubor, Erythema. Periwound temperature was noted as No Abnormality. Assessment Active Problems ICD-10 F62.130Q - Unspecified open wound, right lower leg, sequela Z89.511 - Acquired absence of right leg below knee N18.6 - End stage renal disease E11.622 - Type 2 diabetes mellitus  with other skin ulcer T81.31XS - Disruption of external operation (surgical) wound, not elsewhere classified, sequela L73.2 - Hidradenitis suppurativa [place], [place] B. ([id number]) Procedures Wound #2 Pre-procedure diagnosis of Wound #2 is an Open Surgical Wound located on the Right Amputation Site - Below Knee . There was a Excisional Skin/Subcutaneous Tissue Debridement with a total area of 0.16 sq cm performed by Lawanda Cousins, NP. With the following instrument(s): Blade. to remove Viable and Non-Viable tissue/material Material removed includes Subcutaneous Tissue, and Slough, Fibrin/Exudate, and [place] after achieving pain control using Lidocaine 4% Topical Solution. No specimens were taken. A time out was conducted at 10:41, prior to the start of the procedure. A Minimum amount of bleeding was controlled with Pressure. The procedure was tolerated well with a pain level of 0 throughout and a pain level of 0 following the procedure. Post Debridement Measurements: 0.4cm length x 0.4cm width x 0.7cm depth; 0.088cm^3 volume. Character of Wound/Ulcer Post Debridement requires further debridement. Post procedure Diagnosis Wound #2: Same as Pre-Procedure Plan Wound Cleansing: Wound #2 Right Amputation Site - Below Knee: Clean wound with Normal Saline. Anesthetic (add to Medication List): Wound #2 Right Amputation Site - Below Knee: Topical Lidocaine 4% cream applied to wound bed prior to debridement (In Clinic Only). Skin Barriers/Peri-Wound Care: Wound #2 Right Amputation Site - Below Knee: Skin Prep Primary Wound Dressing: Wound #2 Right Amputation Site - Below Knee: Silver  Alginate - place in wound Silver Collagen - lightly moisten with saline place in the base of wound Secondary Dressing: Wound #2 Right Amputation Site - Below Knee: Boardered Foam Dressing Dressing Change Frequency: Wound #2 Right Amputation Site - Below Knee: Change dressing every day. Follow-up Appointments: Wound #2 Right Amputation Site - Below Knee: Return Appointment in 1 week. Medications-please add to medication list.: Wound #2 Right Amputation Site - Below Knee: P.O. Antibiotics - Doxycycline The following medication(s) was prescribed: lidocaine topical 4 % cream 1 1 cream topical was prescribed at facility Electronic Signature(s) Signed: 02/16/2018 11:47:16 AM By: Magnus Ivan, [name] ([id number]) Entered By: Lawanda Cousins on 02/16/2018 11:47:16 [name] ([id number]) -------------------------------------------------------------------------------- SuperBill Details Patient Name: Blas, Rishit B. Date of Service: 02/16/2018 Medical Record Number: [id number] Patient Account Number: [id number] Date of Birth/Sex: 27-Oct-1960 (57 y.o. M) Treating RN: [name] Primary Care Provider: [name] Other Clinician: Referring Provider: [name] Treating Provider/Extender: Cathie Olden in Treatment: 19 Diagnosis Coding ICD-10 Codes Code Description K12.244L Unspecified open wound, right lower leg, sequela Z89.511 Acquired absence of right leg below knee N18.6 End stage renal disease E11.622 Type 2 diabetes mellitus with other skin ulcer T81.31XS Disruption of external operation (surgical) wound, not elsewhere classified, sequela L73.2 Hidradenitis suppurativa Facility Procedures CPT4 Code: [id number] Description: 02111 - DEB SUBQ TISSUE 20 SQ CM/< ICD-10 Diagnosis Description S81.801S Unspecified open wound, right lower leg, sequela Modifier: Quantity: 1 Physician Procedures CPT4 Code: [id number] Description: 41030 - WC PHYS SUBQ TISS 20 SQ CM  ICD-10 Diagnosis Description S81.801S Unspecified open wound, right lower leg, sequela Modifier: Quantity: 1 Electronic Signature(s) Signed: 02/16/2018 11:47:37 AM By: Lawanda Cousins Entered By: Lawanda Cousins on 02/16/2018 11:47:36

## 2018-02-28 ENCOUNTER — Other Ambulatory Visit: Payer: Self-pay | Admitting: Family Medicine

## 2018-02-28 ENCOUNTER — Ambulatory Visit: Payer: BLUE CROSS/BLUE SHIELD | Admitting: Family Medicine

## 2018-02-28 ENCOUNTER — Encounter: Payer: Self-pay | Admitting: Family Medicine

## 2018-02-28 VITALS — BP 130/70 | HR 68 | Temp 98.4°F | Resp 16 | Ht 71.0 in

## 2018-02-28 DIAGNOSIS — E1129 Type 2 diabetes mellitus with other diabetic kidney complication: Secondary | ICD-10-CM | POA: Diagnosis not present

## 2018-02-28 DIAGNOSIS — Z89511 Acquired absence of right leg below knee: Secondary | ICD-10-CM

## 2018-02-28 DIAGNOSIS — Z794 Long term (current) use of insulin: Secondary | ICD-10-CM

## 2018-02-28 DIAGNOSIS — L03032 Cellulitis of left toe: Secondary | ICD-10-CM | POA: Diagnosis not present

## 2018-02-28 MED ORDER — AMOXICILLIN-POT CLAVULANATE 875-125 MG PO TABS: 1.0000 | ORAL_TABLET | Freq: Two times a day (BID) | ORAL | 0 refills | Status: AC

## 2018-02-28 NOTE — Progress Notes (Signed)
Patient: [name] Male    DOB: [birth year]   57 y.o.   MRN: [id number] Visit Date: 02/28/2018  Today's Provider: [name], MD   Chief Complaint  Patient presents with  . Follow-up   Subjective:    HPI Here for routine follow up of right leg wound and diabetes. Continue regular visits to wound care clinic since LLE amputation. Anticipates getting fitting for prosthesis soon. Lab Results  Component Value Date   HGBA1C 7.0 01/31/2018   Reports that left foot has been a bit sore and swollen lately.     Allergies  Allergen Reactions  . Vancomycin Shortness Of Breath    Eyes watering, SOB, wheezing  . Cefepime Other (See Comments)  . Methotrexate Other (See Comments)    Blood count drops  . Tape      Current Outpatient Medications:  .  Alcohol Swabs PADS, Use as directed to check blood sugar three times daily for insulin dependent type 2 diabetes., Disp: 100 each, Rfl: 12 .  amLODipine (NORVASC) 10 MG tablet, Take 1 tablet (10 mg total) by mouth daily as needed., Disp: 30 tablet, Rfl: 5 .  atorvastatin (LIPITOR) 80 MG tablet, Take 1 tablet (80 mg total) by mouth daily., Disp: 90 tablet, Rfl: 3 .  Blood Glucose Monitoring Suppl (ONE TOUCH ULTRA 2) w/Device KIT, Use as directed to check blood sugar three times daily. E11.9, Disp: 1 each, Rfl: 0 .  calcitRIOL (ROCALTROL) 0.25 MCG capsule, Take 0.5 mcg by mouth daily. , Disp: , Rfl: 1 .  carvedilol (COREG) 25 MG tablet, Take 12.5 mg by mouth 2 (two) times daily. , Disp: , Rfl: 3 .  clopidogrel (PLAVIX) 75 MG tablet, Take 1 tablet (75 mg total) by mouth daily., Disp: 30 tablet, Rfl: 11 .  ferrous sulfate 325 (65 FE) MG tablet, Take 1 tablet by mouth daily., Disp: , Rfl:  .  furosemide (LASIX) 80 MG tablet, Take 1 tablet (80 mg total) by mouth 2 (two) times daily., Disp: 60 tablet, Rfl: 4 .  glucose blood (ONE TOUCH ULTRA TEST) test strip, Use as directed to check blood sugar three times daily. E11.9, Disp: 100 each,  Rfl: 12 .  HYDROcodone-acetaminophen (NORCO/VICODIN) 5-325 MG tablet, Take 1 tablet by mouth every 6 (six) hours as needed for moderate pain or severe pain., Disp: 20 tablet, Rfl: 0 .  inFLIXimab (REMICADE) 100 MG injection, Inject into the vein every 6 (six) weeks. , Disp: , Rfl:  .  insulin glargine (LANTUS) 100 UNIT/ML injection, Inject into the skin at bedtime. Inject as directed by physician, Disp: , Rfl:  .  Insulin Pen Needle 32G X 6 MM MISC, Use to inject insulin via pen up to three times daily for insulin dependent type 2 diabetes., Disp: 100 each, Rfl: 12 .  levETIRAcetam (KEPPRA) 1000 MG tablet, Take 1,000 mg by mouth at bedtime. , Disp: , Rfl: 0 .  lisinopril (PRINIVIL,ZESTRIL) 10 MG tablet, Take 1 tablet by mouth daily., Disp: , Rfl: 1 .  mirtazapine (REMERON) 15 MG tablet, Take 7.5 mg by mouth at bedtime., Disp: , Rfl:  .  NOVOLOG FLEXPEN 100 UNIT/ML FlexPen, INJECT 15 UNITS SUBCUTANEOUSLY THREE TIMES DAILY, Disp: 15 mL, Rfl: 3 .  ONE TOUCH LANCETS MISC, Use as directed to check blood sugar three times daily. E11.9, Disp: 100 each, Rfl: 11 .  polyethylene glycol (MIRALAX / GLYCOLAX) packet, Take 1 packet by mouth as needed., Disp: , Rfl: 0 .  sevelamer carbonate (RENVELA) 800 MG tablet, Take 1 tablet (800 mg total) by mouth 3 (three) times daily., Disp: 90 tablet, Rfl: 1 .  sodium bicarbonate 650 MG tablet, Take 650 mg by mouth 2 (two) times daily., Disp: , Rfl:  .  triamcinolone ointment (KENALOG) 0.1 %, Apply 1 application topically as needed. , Disp: , Rfl: 0  Review of Systems  Constitutional: Negative for appetite change, chills and fever.  Respiratory: Negative for chest tightness, shortness of breath and wheezing.   Cardiovascular: Negative for chest pain and palpitations.  Gastrointestinal: Negative for abdominal pain, nausea and vomiting.    Social History   Tobacco Use  . Smoking status: Never Smoker  . Smokeless tobacco: Never Used  Substance Use Topics  . Alcohol  use: No   Objective:   BP 130/70 (BP Location: Right Arm, Patient Position: Sitting, Cuff Size: Large)   Pulse 68   Temp 98.4 F (36.9 C) (Oral)   Resp 16   Ht _0  (1.803 m)   SpO2 99%   BMI 26.50 kg/m  Vitals:   02/28/18 1107  BP: 130/70  Pulse: 68  Resp: 16  Temp: 98.4 F (36.9 C)  TempSrc: Oral  SpO2: 99%  Height: _1  (1.803 m)     Physical Exam   General Appearance:    Alert, cooperative, no distress  Eyes:    PERRL, conjunctiva/corneas clear, EOM's intact       Lungs:     Clear to auscultation bilaterally, respirations unlabored  Heart:    Regular rate and rhythm  MS::   Healing wound on left stump completely scabbed over with no drainage or erythema. Left 3rd and 4th toes mildly inflamed, swollen and tender.           Assessment & Plan:     1. S/P BKA (below knee amputation) unilateral, right ([place]) Is nearly completely healed. Follow up wound care clinic as scheduled.   2. Cellulitis of toe of left foot start- amoxicillin-clavulanate (AUGMENTIN) 875-125 MG tablet; Take 1 tablet by mouth 2 (two) times daily for 10 days.  Dispense: 20 tablet; Refill: 0  He has upcoming appointments at wound clinic and with vascular surgery and will have them recheck toe. Call if redness and swelling not resolved within 10 days, or if symptoms worsen.   3. Type 2 diabetes mellitus with other diabetic kidney complication, with long-term current use of insulin (HCC) Well controlled.  Continue current medications.    Follow up diabetes in 3 months.         [name], MD  [facility]

## 2018-03-02 ENCOUNTER — Encounter: Payer: BLUE CROSS/BLUE SHIELD | Admitting: Nurse Practitioner

## 2018-03-02 DIAGNOSIS — E11622 Type 2 diabetes mellitus with other skin ulcer: Secondary | ICD-10-CM | POA: Diagnosis not present

## 2018-03-05 NOTE — Progress Notes (Signed)
Slape, DENISE BRAMBLETT ([id number]) Visit Report for 03/02/2018 Chief Complaint Document Details Patient Name: Allinson, Lennex B. Date of Service: 03/02/2018 9:30 AM Medical Record Number: [id number] Patient Account Number: [id number] Date of Birth/Sex: [birth year] (57 y.o. M) Treating RN: [name] Primary Care Provider: [name] Other Clinician: Referring Provider: [name] Treating Provider/Extender: Cathie Olden in Treatment: 21 Information Obtained from: Patient Chief Complaint He is here for evaluation of right bka stump ulcer Electronic Signature(s) Signed: 03/02/2018 10:24:51 AM By: Lawanda Cousins Entered By: Lawanda Cousins on 03/02/2018 10:24:50 Herard, Toribio Harbour ([id number]) -------------------------------------------------------------------------------- HPI Details Patient Name: Kitchens, Nickson B. Date of Service: 03/02/2018 9:30 AM Medical Record Number: [id number] Patient Account Number: [id number] Date of Birth/Sex: [birth year] (57 y.o. M) Treating RN: [name] Primary Care Provider: [name] Other Clinician: Referring Provider: [name] Treating Provider/Extender: Cathie Olden in Treatment: 21 History of Present Illness Location: Patient has a ulcer to Sacrum Quality: denies pain Severity: denies pain Duration: months, but unable to clearly articulate timeframe Context: ulcer occurred secondary to pressure HPI Description: 10-06-17 he is here for initial evaluation of the sacral ulcer. He is unable to articulate exactly this is a states "months". 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 He is currently in a facility, [facility]. He is unsure if he has a pressure reduction mattress. He admits to sitting the majority of the day and sleeping on his back. We discussed the need for lateral sleeping, which he states he is able to do. He voices no complaints of pain, discomfort, fever,  chills, ill feeling. He tolerated debridement. 10/26/17 on evaluation today patient sacral wound appears to be doing excellent. He has been tolerating the dressing changes without complication. He does have some discomfort especially when he has to sit in the chair at dialysis for an extended period of time. With that being said other than that he tries to stay offloaded as much as possible and I think he has done a very good job in that regard. There does not appear to be any new pressure injury. 11/10/17 He is here in follow up evaluation of a sacral ulcer. He is now home, have been using prisma. He presents with essentially unchanged measurements and increased in nonviable tissue. Prior to prisma they were using santyl, will transition back to santyl (spouse to call clinic if they do not have santyl at home). We discussed more aggressive offloading as he continues to sleep on his back, improved diet as he tends to eat "junk food". He does use a pressure redistribution cushion while in dialysis. Follow up in two weeks per their preference. 11/24/17-he is here in follow-up evaluation for a sacral ulcer and dates that his vascular surgeon at [facility] wants to "know if we can use the same medicine" on the BKA stump. Patient's bowel did contact the office, they do not have Santyl at home as originally thought. We will initiate Santyl to both wounds today. He states he does "the best I can" as far as repositioning but has inconsistent offloading while sleeping. He does use the chair cushion while in dialysis. Continue with follow-up in 2 weeks per their preference 12/22/17-he is here in follow-up evaluation for a sacral ulcer and right BKA stump ulcer. He was hospitalized at [facility] from 2/17-2/25 for sepsis due to right groin abscess, but her surgical consult note this is hidradenitis and required no surgical intervention. He was treated  on the inpatient side with daptomycin. He was discharged on  doxycycline which he has completed. he presents today with a healed sacral ulcer. He has a follow-up appointment at [facility] in a couple of weeks regarding the right BKA stump. He will follow-up in 2 weeks per his preference. 01/05/18-He is here in follow up evaluation for right BKA stump ulcer. There is improvement of this ulcer and we will continue with Santyl. He has seen vascular medicine at [facility] regarding this with no changes in treatment plan. He does have a history of hidradenitis and is following Dr. Otho Ket at [facility] dermatology which he saw Monday. He notes increased pain but no change in drainage to his groin. We will culture this today and hold off on antibiotic therapy until sensitivities resulted; he see Dr Otho Ket in three weeks 01/19/18-He is here in follow-up evaluation for right BKA stump. There is improvement, we will change to Prisma. The culture that was obtained from his right groin last week grew Enterococcus faecalis; Monday he was started on Augmentin. He states he is tolerating the Augmentin with no adverse GI effects. He has no appointment later this month with dermatology. He will follow-up here in 2 weeks 02/02/18-He is here in preparation for right BKA stump ulcer. There is minimal improvement but we will continue with Prisma. He does have a follow-up appointment with vascular medicine this afternoon. His appointment with dermatology as in June not later this month as he originally thought. He does note improvement in drainage from his right groin/hidradenitis. He will follow-up in 2 weeks per his preference. 02/08/18; he was brought in earlier today to see [place] with regards to concerns raised by his wife about purulent drainage coming out of the stump area around the wound that she noted on Sunday for the first time. He is not complaining of pain. There are Beining, Cornie B. ([id number]) no systemic symptoms. He was on Augmentin earlier this week for Enterococcus faecalis that was  cultured from his right groin. Purulent drainage also noted by our intake nurse when the dressing was changed 02/16/18 He is here for follow up evaluation of right BKA stump. He had to be seen last week for increased erythema, edema and pain. Culture was obtained and he was initiated on doxycycline; culture grew oxacillin sensitive staph aureus. He will complete his doxycycline tomorrow morning. He is voicing no complaints or concerns. We will treat with collagen to the base and silvercel to fill. He will follow up in two weeks. 03/02/18-He is here in follow-up evaluation for right BKA stump ulcer. There is improvement in both measurements and appearance. He states that last week he went to his primary care and was initiated on oral antibiotics for left foot and toe cellulitis; on exam today he has no edema, minimal erythema to the distal aspect of his toes, which could be vascular in nature. He does have dry skin noted to the left first second and third toes; there is an area of questionable open tissue or recently healed open area to the left great toe we will apply silvercel and monitor. He denies any drainage to this area is no evidence of drainage on today's exam we will air on the side of caution given his significant history. Electronic Signature(s) Signed: 03/02/2018 10:26:51 AM By: Lawanda Cousins Entered By: Lawanda Cousins on 03/02/2018 10:26:50 Langenberg, Toribio Harbour ([id number]) -------------------------------------------------------------------------------- Physician Orders Details Patient Name: Meharg, Vin B. Date of Service: 03/02/2018 9:30 AM Medical Record Number: [id number] Patient Account  Number: [id number] Date of Birth/Sex: October 22, 1960 (57 y.o. M) Treating RN: [name] Primary Care Provider: [name] Other Clinician: Referring Provider: [name] Treating Provider/Extender: Cathie Olden in Treatment: 70 Verbal / Phone Orders: Yes Clinician: Carolyne Fiscal,  Debi Read Back and Verified: Yes Diagnosis Coding Wound Cleansing Wound #2 Right Amputation Site - Below Knee o Clean wound with Normal Saline. Anesthetic (add to Medication List) Wound #2 Right Amputation Site - Below Knee o Topical Lidocaine 4% cream applied to wound bed prior to debridement (In Clinic Only). Skin Barriers/Peri-Wound Care Wound #2 Right Amputation Site - Below Knee o Skin Prep Primary Wound Dressing Wound #2 Right Amputation Site - Below Knee o Silver Alginate - place in wound o Silver Collagen - lightly moisten with saline place in the base of wound Secondary Dressing Wound #2 Right Amputation Site - Below Knee o Boardered Foam Dressing Dressing Change Frequency Wound #2 Right Amputation Site - Below Knee o Change dressing every day. Follow-up Appointments Wound #2 Right Amputation Site - Below Knee o Return Appointment in 1 week. Patient Medications Allergies: adhesive, cefepime, methotrexate, silicon, vancomycin Notifications Medication Indication Start End lidocaine DOSE 1 - topical 4 % cream - 1 cream topical Electronic Signature(s) Signed: 03/02/2018 5:24:28 PM By: Lawanda Cousins Signed: 03/03/2018 4:38:26 PM By: Keenan Bachelor, Toribio Harbour ([id number]) Entered By: Alric Quan on 03/02/2018 10:14:24 Vanover, Toribio Harbour ([id number]) -------------------------------------------------------------------------------- Prescription 03/02/2018 Patient Name: Wasilewski, Maxden B. Provider: Lawanda Cousins NP Date of Birth: 1961/02/06 NPI#: [id number] Sex: Jerilynn Mages DEA#: [id number] Phone #: [phone] License #: Patient Address: [facility] [place], [place] [address] [address] [place], [place] [address] [phone] Allergies adhesive cefepime methotrexate silicon vancomycin Medication Medication: Route: Strength: Form: lidocaine topical 4%  cream Class: TOPICAL LOCAL ANESTHETICS Dose: Frequency / Time: Indication: 1 1 cream topical Number of Refills: Number of Units: 0 Generic Substitution: Start Date: End Date: Administered at [place]: Yes Time Administered: Time Discontinued: Note to Pharmacy: Signature(s): Date(s): Morreale, DAGAN HEINZ ([id number]) Electronic Signature(s) Signed: 03/02/2018 5:24:28 PM By: Lawanda Cousins Signed: 03/03/2018 4:38:26 PM By: Alric Quan Entered By: Alric Quan on 03/02/2018 10:14:26 Glaeser, Toribio Harbour ([id number]) --------------------------------------------------------------------------------  Problem List Details Patient Name: Peters, Larwence B. Date of Service: 03/02/2018 9:30 AM Medical Record Number: [id number] Patient Account Number: [id number] Date of Birth/Sex: [birth year] (57 y.o. M) Treating RN: [name] Primary Care Provider: [name] Other Clinician: Referring Provider: [name] Treating Provider/Extender: Cathie Olden in Treatment: 21 Active Problems ICD-10 Impacting Encounter Code Description Active Date Wound Healing Diagnosis S81.801S Unspecified open wound, right lower leg, sequela 11/24/2017 Yes Z89.511 Acquired absence of right leg below knee 10/06/2017 Yes N18.6 End stage renal disease 10/06/2017 Yes E11.622 Type 2 diabetes mellitus with other skin ulcer 10/06/2017 Yes T81.31XS Disruption of external operation (surgical) wound, not 11/24/2017 Yes elsewhere classified, sequela L73.2 Hidradenitis suppurativa 01/05/2018 Yes Inactive Problems Resolved Problems ICD-10 Code Description Active Date Resolved Date L89.153 Pressure ulcer of sacral region, stage 3 10/06/2017 12/22/2017 Electronic Signature(s) Signed: 03/02/2018 10:24:31 AM By: Lawanda Cousins Entered By: Lawanda Cousins on 03/02/2018 10:24:30 Goodchild, Toribio Harbour  ([id number]) -------------------------------------------------------------------------------- Progress Note Details Patient Name: Maciejewski, Dehaven B. Date of Service: 03/02/2018 9:30 AM Medical Record Number: [id number] Patient Account Number: [id number] Date of Birth/Sex: 09-28-1961 (57 y.o. M) Treating RN: [name] Primary Care Provider: [name] Other Clinician: Referring Provider: [name] Treating Provider/Extender: Cathie Olden in Treatment: 52  Subjective Chief Complaint Information obtained from Patient He is here for evaluation of right bka stump ulcer History of Present Illness (HPI) The following HPI elements were documented for the patient's wound: Location: Patient has a ulcer to Sacrum Quality: denies pain Severity: denies pain Duration: months, but unable to clearly articulate timeframe Context: ulcer occurred secondary to pressure 10-06-17 he is here for initial evaluation of the sacral ulcer. He is unable to articulate exactly this is a states "months". 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 He is currently in a facility, [facility]. He is unsure if he has a pressure reduction mattress. He admits to sitting the majority of the day and sleeping on his back. We discussed the need for lateral sleeping, which he states he is able to do. He voices no complaints of pain, discomfort, fever, chills, ill feeling. He tolerated debridement. 10/26/17 on evaluation today patient sacral wound appears to be doing excellent. He has been tolerating the dressing changes without complication. He does have some discomfort especially when he has to sit in the chair at dialysis for an extended period of time. With that being said other than that he tries to stay offloaded as much as possible and I think he has done a very good job in that regard. There does not appear to be any new pressure injury. 11/10/17 He is here  in follow up evaluation of a sacral ulcer. He is now home, have been using prisma. He presents with essentially unchanged measurements and increased in nonviable tissue. Prior to prisma they were using santyl, will transition back to santyl (spouse to call clinic if they do not have santyl at home). We discussed more aggressive offloading as he continues to sleep on his back, improved diet as he tends to eat "junk food". He does use a pressure redistribution cushion while in dialysis. Follow up in two weeks per their preference. 11/24/17-he is here in follow-up evaluation for a sacral ulcer and dates that his vascular surgeon at [facility] wants to "know if we can use the same medicine" on the BKA stump. Patient's bowel did contact the office, they do not have Santyl at home as originally thought. We will initiate Santyl to both wounds today. He states he does "the best I can" as far as repositioning but has inconsistent offloading while sleeping. He does use the chair cushion while in dialysis. Continue with follow-up in 2 weeks per their preference 12/22/17-he is here in follow-up evaluation for a sacral ulcer and right BKA stump ulcer. He was hospitalized at [facility] from 2/17-2/25 for sepsis due to right groin abscess, but her surgical consult note this is hidradenitis and required no surgical intervention. He was treated on the inpatient side with daptomycin. He was discharged on doxycycline which he has completed. he presents today with a healed sacral ulcer. He has a follow-up appointment at [facility] in a couple of weeks regarding the right BKA stump. He will follow-up in 2 weeks per his preference. 01/05/18-He is here in follow up evaluation for right BKA stump ulcer. There is improvement of this ulcer and we will continue with Santyl. He has seen vascular medicine at [facility] regarding this with no changes in treatment plan. He does have a history of hidradenitis and is following Dr. Otho Ket at [facility]  dermatology which he saw Monday. He notes increased pain but no change in drainage to his groin. We will culture  this today and hold off on antibiotic therapy until sensitivities resulted; he see Dr Otho Ket in three weeks 01/19/18-He is here in follow-up evaluation for right BKA stump. There is improvement, we will change to Prisma. The culture that was obtained from his right groin last week grew Enterococcus faecalis; Monday he was started on Augmentin. He states Downard, Zev B. ([id number]) he is tolerating the Augmentin with no adverse GI effects. He has no appointment later this month with dermatology. He will follow-up here in 2 weeks 02/02/18-He is here in preparation for right BKA stump ulcer. There is minimal improvement but we will continue with Prisma. He does have a follow-up appointment with vascular medicine this afternoon. His appointment with dermatology as in June not later this month as he originally thought. He does note improvement in drainage from his right groin/hidradenitis. He will follow-up in 2 weeks per his preference. 02/08/18; he was brought in earlier today to see [place] with regards to concerns raised by his wife about purulent drainage coming out of the stump area around the wound that she noted on Sunday for the first time. He is not complaining of pain. There are no systemic symptoms. He was on Augmentin earlier this week for Enterococcus faecalis that was cultured from his right groin. Purulent drainage also noted by our intake nurse when the dressing was changed 02/16/18 He is here for follow up evaluation of right BKA stump. He had to be seen last week for increased erythema, edema and pain. Culture was obtained and he was initiated on doxycycline; culture grew oxacillin sensitive staph aureus. He will complete his doxycycline tomorrow morning. He is voicing no complaints or concerns. We will treat with collagen to the base and silvercel to fill. He will follow up in two  weeks. 03/02/18-He is here in follow-up evaluation for right BKA stump ulcer. There is improvement in both measurements and appearance. He states that last week he went to his primary care and was initiated on oral antibiotics for left foot and toe cellulitis; on exam today he has no edema, minimal erythema to the distal aspect of his toes, which could be vascular in nature. He does have dry skin noted to the left first second and third toes; there is an area of questionable open tissue or recently healed open area to the left great toe we will apply silvercel and monitor. He denies any drainage to this area is no evidence of drainage on today's exam we will air on the side of caution given his significant history. Patient History Information obtained from Patient. Family History Diabetes - Mother,Siblings, No family history of Cancer, Heart Disease, Hereditary Spherocytosis, Hypertension, Kidney Disease, Lung Disease, Seizures, Stroke, Thyroid Problems, Tuberculosis. Social History Former smoker - quit 20+ years ago, Marital Status - Married, Alcohol Use - Never, Drug Use - No History, Caffeine Use - Daily. Medical And Surgical History Notes Genitourinary renal disorder - CKD Neurologic CVA - 3 months ago Objective Constitutional Vitals Time Taken: 9:51 AM, Height: 71 in, Weight: 178 lbs, BMI: 24.8, Temperature: 98.2 F, Pulse: 59 bpm, Respiratory Rate: 18 breaths/min, Blood Pressure: 132/72 mmHg. Integumentary (Hair, Skin) Wound #2 status is Open. Original cause of wound was Surgical Injury. The wound is located on the Right Amputation Site - Stlaurent, Rett B. ([id number]) Below Knee. The wound measures 0.3cm length x 0.2cm width x 0.4cm depth; 0.047cm^2 area and 0.019cm^3 volume. There is Fat Layer (Subcutaneous Tissue) Exposed exposed. There is no tunneling or undermining noted. There  is a medium amount of serous drainage noted. The wound margin is flat and intact. There is large  (67-100%) red granulation within the wound bed. There is a small (1-33%) amount of necrotic tissue within the wound bed including Adherent Slough. The periwound skin appearance exhibited: Excoriation. The periwound skin appearance did not exhibit: Callus, Crepitus, Induration, Rash, Scarring, Dry/Scaly, Maceration, Atrophie Blanche, Cyanosis, Ecchymosis, Hemosiderin Staining, Mottled, Pallor, Rubor, Erythema. Periwound temperature was noted as No Abnormality. Assessment Active Problems ICD-10 X10.626R - Unspecified open wound, right lower leg, sequela Z89.511 - Acquired absence of right leg below knee N18.6 - End stage renal disease E11.622 - Type 2 diabetes mellitus with other skin ulcer T81.31XS - Disruption of external operation (surgical) wound, not elsewhere classified, sequela L73.2 - Hidradenitis suppurativa Plan Wound Cleansing: Wound #2 Right Amputation Site - Below Knee: Clean wound with Normal Saline. Anesthetic (add to Medication List): Wound #2 Right Amputation Site - Below Knee: Topical Lidocaine 4% cream applied to wound bed prior to debridement (In Clinic Only). Skin Barriers/Peri-Wound Care: Wound #2 Right Amputation Site - Below Knee: Skin Prep Primary Wound Dressing: Wound #2 Right Amputation Site - Below Knee: Silver Alginate - place in wound Silver Collagen - lightly moisten with saline place in the base of wound Secondary Dressing: Wound #2 Right Amputation Site - Below Knee: Boardered Foam Dressing Dressing Change Frequency: Wound #2 Right Amputation Site - Below Knee: Change dressing every day. Follow-up Appointments: Wound #2 Right Amputation Site - Below Knee: Return Appointment in 1 week. The following medication(s) was prescribed: lidocaine topical 4 % cream 1 1 cream topical was prescribed at facility Parlow, Nilson B. ([id number]) he was encouraged to have vascular medicine evaluate the left lower extremity at his next follow-up Electronic  Signature(s) Signed: 03/02/2018 10:27:37 AM By: Lawanda Cousins Entered By: Lawanda Cousins on 03/02/2018 10:27:36 Graul, Toribio Harbour ([id number]) -------------------------------------------------------------------------------- ROS/PFSH Details Patient Name: Sauseda, Jakie B. Date of Service: 03/02/2018 9:30 AM Medical Record Number: [id number] Patient Account Number: [id number] Date of Birth/Sex: Sep 25, 1961 (57 y.o. M) Treating RN: [name] Primary Care Provider: [name] Other Clinician: Referring Provider: [name] Treating Provider/Extender: Cathie Olden in Treatment: 21 Information Obtained From Patient Wound History Do you currently have one or more open woundso Yes How many open wounds do you currently haveo 1 Approximately how long have you had your woundso about 2 months How have you been treating your wound(s) until nowo santyl Has your wound(s) ever healed and then re-openedo No Have you had any lab work done in the past montho No Have you tested positive for an antibiotic resistant organism (MRSA, VRE)o No Have you tested positive for osteomyelitis (bone infection)o No Have you had any tests for circulation on your legso Yes Who ordered the testo PCP Where was the test doneo [facility] Medical History: Negative for: Cataracts; Glaucoma; Optic Neuritis Hematologic/Lymphatic Medical History: Negative for: Anemia; Hemophilia; Human Immunodeficiency Virus; Lymphedema; Sickle Cell Disease Respiratory Medical History: Negative for: Aspiration; Asthma; Chronic Obstructive Pulmonary Disease (COPD); Pneumothorax; Sleep Apnea; Tuberculosis Cardiovascular Medical History: Positive for: Hypertension; Peripheral Arterial Disease Negative for: Angina; Arrhythmia; Congestive Heart Failure; Coronary Artery Disease; Deep Vein Thrombosis; Hypotension; Myocardial Infarction; Peripheral Venous Disease; Phlebitis; Vasculitis Gastrointestinal Medical  History: Positive for: Crohnos Negative for: Cirrhosis ; Colitis; Hepatitis A; Hepatitis B; Hepatitis C Endocrine Medical History: Positive for: Type II Diabetes Stanczak, Byrd B. ([id number]) Time with diabetes: 20+ years Treated with: Insulin Blood sugar tested every day: Yes Tested : Genitourinary Medical  History: Past Medical History Notes: renal disorder - CKD Immunological Medical History: Negative for: Lupus Erythematosus; Raynaudos; Scleroderma Musculoskeletal Medical History: Negative for: Gout; Rheumatoid Arthritis; Osteoarthritis; Osteomyelitis Neurologic Medical History: Negative for: Dementia; Neuropathy; Quadriplegia; Paraplegia; Seizure Disorder Past Medical History Notes: CVA - 3 months ago Oncologic Medical History: Negative for: Received Chemotherapy; Received Radiation Immunizations Pneumococcal Vaccine: Received Pneumococcal Vaccination: Yes Implantable Devices Family and Social History Cancer: No; Diabetes: Yes - Mother,Siblings; Heart Disease: No; Hereditary Spherocytosis: No; Hypertension: No; Kidney Disease: No; Lung Disease: No; Seizures: No; Stroke: No; Thyroid Problems: No; Tuberculosis: No; Former smoker - quit 20+ years ago; Marital Status - Married; Alcohol Use: Never; Drug Use: No History; Caffeine Use: Daily; Financial Concerns: No; Food, Clothing or Shelter Needs: No; Support System Lacking: No; Transportation Concerns: No; Advanced Directives: No; Patient does not want information on Advanced Directives Physician Affirmation I have reviewed and agree with the above information. Electronic Signature(s) Signed: 03/02/2018 5:24:28 PM By: Lawanda Cousins Signed: 03/03/2018 4:38:26 PM By: Alric Quan Entered By: Lawanda Cousins on 03/02/2018 10:27:02 Rippeon, Toribio Harbour ([id number]) -------------------------------------------------------------------------------- [place] Details Patient Name: Legner, Stacey B. Date of Service:  03/02/2018 Medical Record Number: [id number] Patient Account Number: [id number] Date of Birth/Sex: Sep 19, 1961 (57 y.o. M) Treating RN: [name] Primary Care Provider: [name] Other Clinician: Referring Provider: [name] Treating Provider/Extender: Cathie Olden in Treatment: 21 Diagnosis Coding ICD-10 Codes Code Description S81.801S Unspecified open wound, right lower leg, sequela Z89.511 Acquired absence of right leg below knee N18.6 End stage renal disease E11.622 Type 2 diabetes mellitus with other skin ulcer T81.31XS Disruption of external operation (surgical) wound, not elsewhere classified, sequela L73.2 Hidradenitis suppurativa Facility Procedures CPT4 Code: 70177939 Description: 99213 - WOUND CARE VISIT-LEV 3 EST PT Modifier: Quantity: 1 Physician Procedures CPT4 Code: 0300923 Description: 99213 - WC PHYS LEVEL 3 - EST PT ICD-10 Diagnosis Description S81.801S Unspecified open wound, right lower leg, sequela Z89.511 Acquired absence of right leg below knee E11.622 Type 2 diabetes mellitus with other skin ulcer Modifier: Quantity: 1 Electronic Signature(s) Signed: 03/02/2018 1:25:18 PM By: Alric Quan Signed: 03/02/2018 5:24:28 PM By: Lawanda Cousins Previous Signature: 03/02/2018 10:28:05 AM Version By: Lawanda Cousins Entered By: Alric Quan on 03/02/2018 13:25:18

## 2018-03-05 NOTE — Progress Notes (Signed)
DERWOOD, BECRAFT ([id number]) Visit Report for 03/02/2018 Arrival Information Details Patient Name: Brady, [name] Date of Service: 03/02/2018 9:30 AM Medical Record Number: [id number] Patient Account Number: [id number] Date of Birth/Sex: 08/22/1961 (57 y.o. M) Treating RN: Montey Hora Primary Care Keela Rubert: [name] Other Clinician: Referring Octavia Velador: [name] Treating Kinslea Frances/Extender: Cathie Olden in Treatment: 21 Visit Information History Since Last Visit Added or deleted any medications: No Patient Arrived: Wheel Chair Any new allergies or adverse reactions: No Arrival Time: 09:50 Had a fall or experienced change in No activities of daily living that may affect Accompanied By: dtr risk of falls: Transfer Assistance: None Signs or symptoms of abuse/neglect since last visito No Patient Identification Verified: Yes Hospitalized since last visit: No Secondary Verification Process Completed: Yes Implantable device outside of the clinic excluding No Patient Requires Transmission-Based No cellular tissue based products placed in the center Precautions: since last visit: Patient Has Alerts: Yes Has Dressing in Place as Prescribed: Yes Patient Alerts: DMII Pain Present Now: No Electronic Signature(s) Signed: 03/02/2018 4:44:42 PM By: Montey Hora Entered By: Montey Hora on 03/02/2018 09:50:32 [name] ([id number]) -------------------------------------------------------------------------------- Clinic Level of Care Assessment Details Patient Name: Brady, [name] Date of Service: 03/02/2018 9:30 AM Medical Record Number: [id number] Patient Account Number: [id number] Date of Birth/Sex: 06-May-1961 (57 y.o. M) Treating RN: [name] Primary Care Anael Rosch: [name] Other Clinician: Referring Jassmine Vandruff: [name] Treating Ameisha Mcclellan/Extender: Cathie Olden in Treatment: 21 Clinic Level of Care Assessment Items TOOL 4  Quantity Score X - Use when only an EandM is performed on FOLLOW-UP visit 1 0 ASSESSMENTS - Nursing Assessment / Reassessment X - Reassessment of Co-morbidities (includes updates in patient status) 1 10 X- 1 5 Reassessment of Adherence to Treatment Plan ASSESSMENTS - Wound and Skin Assessment / Reassessment X - Simple Wound Assessment / Reassessment - one wound 1 5 []  - 0 Complex Wound Assessment / Reassessment - multiple wounds []  - 0 Dermatologic / Skin Assessment (not related to wound area) ASSESSMENTS - Focused Assessment []  - Circumferential Edema Measurements - multi extremities 0 []  - 0 Nutritional Assessment / Counseling / Intervention []  - 0 Lower Extremity Assessment (monofilament, tuning fork, pulses) []  - 0 Peripheral Arterial Disease Assessment (using hand held doppler) ASSESSMENTS - Ostomy and/or Continence Assessment and Care []  - Incontinence Assessment and Management 0 []  - 0 Ostomy Care Assessment and Management (repouching, etc.) PROCESS - Coordination of Care X - Simple Patient / Family Education for ongoing care 1 15 []  - 0 Complex (extensive) Patient / Family Education for ongoing care []  - 0 Staff obtains Programmer, systems, Records, Test Results / Process Orders []  - 0 Staff telephones HHA, Nursing Homes / Clarify orders / etc []  - 0 Routine Transfer to another Facility (non-emergent condition) []  - 0 Routine Hospital Admission (non-emergent condition) []  - 0 New Admissions / Biomedical engineer / Ordering NPWT, Apligraf, etc. []  - 0 Emergency Hospital Admission (emergent condition) X- 1 10 Simple Discharge Coordination Brady, [name] ([id number]) []  - 0 Complex (extensive) Discharge Coordination PROCESS - Special Needs []  - Pediatric / Minor Patient Management 0 []  - 0 Isolation Patient Management []  - 0 Hearing / Language / Visual special needs []  - 0 Assessment of Community assistance (transportation, D/C planning, etc.) []  - 0 Additional  assistance / Altered mentation []  - 0 Support Surface(s) Assessment (bed, cushion, seat, etc.) INTERVENTIONS - Wound Cleansing / Measurement X - Simple Wound Cleansing - one wound 1 5 []  -  0 Complex Wound Cleansing - multiple wounds X- 1 5 Wound Imaging (photographs - any number of wounds) []  - 0 Wound Tracing (instead of photographs) X- 1 5 Simple Wound Measurement - one wound []  - 0 Complex Wound Measurement - multiple wounds INTERVENTIONS - Wound Dressings X - Small Wound Dressing one or multiple wounds 1 10 []  - 0 Medium Wound Dressing one or multiple wounds []  - 0 Large Wound Dressing one or multiple wounds X- 1 5 Application of Medications - topical []  - 0 Application of Medications - injection INTERVENTIONS - Miscellaneous []  - External ear exam 0 []  - 0 Specimen Collection (cultures, biopsies, blood, body fluids, etc.) []  - 0 Specimen(s) / Culture(s) sent or taken to Lab for analysis []  - 0 Patient Transfer (multiple staff / Civil Service fast streamer / Similar devices) []  - 0 Simple Staple / Suture removal (25 or less) []  - 0 Complex Staple / Suture removal (26 or more) []  - 0 Hypo / Hyperglycemic Management (close monitor of Blood Glucose) []  - 0 Ankle / Brachial Index (ABI) - do not check if billed separately X- 1 5 Vital Signs Brady, [name] ([id number]) Has the patient been seen at the hospital within the last three years: Yes Total Score: 80 Level Of Care: New/Established - Level 3 Electronic Signature(s) Signed: 03/03/2018 4:38:26 PM By: Alric Quan Entered By: Alric Quan on 03/02/2018 13:25:05 [name] ([id number]) -------------------------------------------------------------------------------- Encounter Discharge Information Details Patient Name: [name], [name] Date of Service: 03/02/2018 9:30 AM Medical Record Number: [id number] Patient Account Number: [id number] Date of Birth/Sex: [birth year] (57 y.o. M) Treating RN: [name] Primary Care Lacinda Curvin: [name] Other Clinician: Referring Seamus Warehime: [name] Treating Nickia Boesen/Extender: Cathie Olden in Treatment: 21 Encounter Discharge Information Items Discharge Condition: Stable Ambulatory Status: Wheelchair Discharge Destination: Home Transportation: Private Auto Accompanied By: daughter Schedule Follow-up Appointment: Yes Clinical Summary of Care: Electronic Signature(s) Signed: 03/03/2018 4:38:26 PM By: Alric Quan Entered By: Alric Quan on 03/02/2018 10:25:08 [place], [name] ([id number]) -------------------------------------------------------------------------------- Lower Extremity Assessment Details Patient Name: Brady, [name] Date of Service: 03/02/2018 9:30 AM Medical Record Number: [id number] Patient Account Number: [id number] Date of Birth/Sex: 1961/05/24 (57 y.o. M) Treating RN: Montey Hora Primary Care Macallister Ashmead: [name] Other Clinician: Referring Megann Easterwood: [name] Treating Jalene Demo/Extender: Lawanda Cousins Weeks in Treatment: 21 Edema Assessment Assessed: [Left: No] [Right: No] Edema: [Left: N] [Right: o] Vascular Assessment Pulses: Posterior Tibial Popliteal Palpable: [Right:Yes] Extremity colors, hair growth, and conditions: Extremity Color: [Right:Hyperpigmented] Hair Growth on Extremity: [Right:Yes] Temperature of Extremity: [Right:Warm] Capillary Refill: [Right:< 3 seconds] Electronic Signature(s) Signed: 03/02/2018 4:44:42 PM By: Montey Hora Entered By: Montey Hora on 03/02/2018 09:59:53 Joost, Kyngston B. ([id number]) -------------------------------------------------------------------------------- Multi Wound Chart Details Patient Name: Brady, [name] Date of Service: 03/02/2018 9:30 AM Medical Record Number: [id number] Patient Account Number: [id number] Date of Birth/Sex: 1961/02/03 (57 y.o. M) Treating RN: [name] Primary Care Nakeisha Greenhouse: [name] Other  Clinician: Referring Rumor Sun: [name] Treating Faizah Kandler/Extender: Cathie Olden in Treatment: 21 Vital Signs Height(in): 71 Pulse(bpm): 4 Weight(lbs): 178 Blood Pressure(mmHg): 132/72 Body Mass Index(BMI): 25 Temperature(F): 98.2 Respiratory Rate 18 (breaths/min): Photos: [2:No Photos] [N/A:N/A] Wound Location: [2:Right Amputation Site - Below Knee] [N/A:N/A] Wounding Event: [2:Surgical Injury] [N/A:N/A] Primary Etiology: [2:Open Surgical Wound] [N/A:N/A] Comorbid History: [2:Hypertension, Peripheral Arterial Disease, Crohnos, Type II Diabetes] [N/A:N/A] Date Acquired: [2:06/27/2017] [N/A:N/A] Weeks of Treatment: [2:14] [N/A:N/A] Wound Status: [2:Open] [N/A:N/A] Measurements L x W x D [2:0.3x0.2x0.4] [N/A:N/A] (cm) Area (cm) : [2:0.047] [N/A:N/A]  Volume (cm) : [2:0.019] [N/A:N/A] % Reduction in Area: [2:83.40%] [N/A:N/A] % Reduction in Volume: [2:66.70%] [N/A:N/A] Classification: [2:Full Thickness Without Exposed Support Structures] [N/A:N/A] Exudate Amount: [2:Medium] [N/A:N/A] Exudate Type: [2:Serous] [N/A:N/A] Exudate Color: [2:amber] [N/A:N/A] Wound Margin: [2:Flat and Intact] [N/A:N/A] Granulation Amount: [2:Large (67-100%)] [N/A:N/A] Granulation Quality: [2:Red] [N/A:N/A] Necrotic Amount: [2:Small (1-33%)] [N/A:N/A] Exposed Structures: [2:Fat Layer (Subcutaneous Tissue) Exposed: Yes Fascia: No Tendon: No Muscle: No Joint: No Bone: No] [N/A:N/A] Epithelialization: [2:Small (1-33%)] [N/A:N/A] Periwound Skin Texture: [2:Excoriation: Yes Induration: No Callus: No] [N/A:N/A] Crepitus: No Rash: No Scarring: No Periwound Skin Moisture: Maceration: No N/A N/A Dry/Scaly: No Periwound Skin Color: Atrophie Blanche: No N/A N/A Cyanosis: No Ecchymosis: No Erythema: No Hemosiderin Staining: No Mottled: No Pallor: No Rubor: No Temperature: No Abnormality N/A N/A Tenderness on Palpation: No N/A N/A Wound Preparation: Ulcer Cleansing: N/A  N/A Rinsed/Irrigated with Saline Topical Anesthetic Applied: Other: lidocaine 4% Treatment Notes Wound #2 (Right Amputation Site - Below Knee) 1. Cleansed with: Clean wound with Normal Saline 2. Anesthetic Topical Lidocaine 4% cream to wound bed prior to debridement 3. Peri-wound Care: Skin Prep 4. Dressing Applied: Calcium Alginate Other dressing (specify in notes) 5. Secondary Dressing Applied Bordered Foam Dressing Notes silvercel over the prisma Electronic Signature(s) Signed: 03/02/2018 10:24:38 AM By: Lawanda Cousins Entered By: Lawanda Cousins on 03/02/2018 10:24:37 Sprung, [name] ([id number]) -------------------------------------------------------------------------------- [place] Details Patient Name: Brady, [name] Date of Service: 03/02/2018 9:30 AM Medical Record Number: [id number] Patient Account Number: [id number] Date of Birth/Sex: [birth year] (57 y.o. M) Treating RN: [name] Primary Care Abriel Hattery: [name] Other Clinician: Referring Gay Moncivais: [name] Treating Alfredia Desanctis/Extender: Cathie Olden in Treatment: 21 Active Inactive ` Abuse / Safety / Falls / Self Care Management Nursing Diagnoses: Impaired physical mobility Goals: Patient will remain injury free related to falls Date Initiated: 10/06/2017 Target Resolution Date: 12/24/2017 Goal Status: Active Interventions: Assess fall risk on admission and as needed Notes: ` Orientation to the Wound Care Program Nursing Diagnoses: Knowledge deficit related to the wound healing center program Goals: Patient/caregiver will verbalize understanding of the [facility] Program Date Initiated: 10/06/2017 Target Resolution Date: 12/24/2017 Goal Status: Active Interventions: Provide education on orientation to the wound center Notes: ` Pressure Nursing Diagnoses: Knowledge deficit related to management of pressures ulcers Goals: Patient will remain free  from development of additional pressure ulcers Date Initiated: 11/10/2017 Target Resolution Date: 11/25/2017 Goal Status: Active Interventions: Brady, [name] ([id number]) Assess: immobility, friction, shearing, incontinence upon admission and as needed Assess offloading mechanisms upon admission and as needed Treatment Activities: Patient referred for pressure reduction/relief devices : 11/10/2017 Notes: ` Wound/Skin Impairment Nursing Diagnoses: Impaired tissue integrity Goals: Ulcer/skin breakdown will heal within 14 weeks Date Initiated: 10/06/2017 Target Resolution Date: 12/24/2017 Goal Status: Active Interventions: Assess patient/caregiver ability to obtain necessary supplies Assess patient/caregiver ability to perform ulcer/skin care regimen upon admission and as needed Assess ulceration(s) every visit Notes: Electronic Signature(s) Signed: 03/03/2018 4:38:26 PM By: Alric Quan Entered By: Alric Quan on 03/02/2018 10:13:13 Buntin, Chesky B. ([id number]) -------------------------------------------------------------------------------- Pain Assessment Details Patient Name: Brady, [name] Date of Service: 03/02/2018 9:30 AM Medical Record Number: [id number] Patient Account Number: [id number] Date of Birth/Sex: 08-14-1961 (57 y.o. M) Treating RN: Montey Hora Primary Care Matina Rodier: [name] Other Clinician: Referring Sahar Ryback: [name] Treating Jamarl Pew/Extender: Cathie Olden in Treatment: 21 Active Problems Location of Pain Severity and Description of Pain Patient Has Paino No Site Locations Pain Management and Medication Current Pain Management: Electronic Signature(s) Signed:  03/02/2018 4:44:42 PM By: Montey Hora Entered By: Montey Hora on 03/02/2018 09:50:38 Paxson, [name] ([id number]) -------------------------------------------------------------------------------- Patient/Caregiver Education Details Patient Name: Brady,  [name] Date of Service: 03/02/2018 9:30 AM Medical Record Number: [id number] Patient Account Number: [id number] Date of Birth/Gender: 19-Nov-1960 (57 y.o. M) Treating RN: [name] Primary Care Physician: [name] Other Clinician: Referring Physician: [name] Treating Physician/Extender: Cathie Olden in Treatment: 21 Education Assessment Education Provided To: Patient Education Topics Provided Wound/Skin Impairment: Handouts: Caring for Your Ulcer, Other: change dressing as ordered Methods: Demonstration, Explain/Verbal Responses: State content correctly Electronic Signature(s) Signed: 03/03/2018 4:38:26 PM By: Alric Quan Entered By: Alric Quan on 03/02/2018 10:25:32 Chopp, [name] ([id number]) -------------------------------------------------------------------------------- Wound Assessment Details Patient Name: Brady, [name] Date of Service: 03/02/2018 9:30 AM Medical Record Number: [id number] Patient Account Number: [id number] Date of Birth/Sex: 1960/12/27 (57 y.o. M) Treating RN: Montey Hora Primary Care Bernetha Anschutz: [name] Other Clinician: Referring Bennetta Rudden: [name] Treating Halee Glynn/Extender: Cathie Olden in Treatment: 21 Wound Status Wound Number: 2 Primary Open Surgical Wound Etiology: Wound Location: Right Amputation Site - Below Knee Wound Open Wounding Event: Surgical Injury Status: Date Acquired: 06/27/2017 Comorbid Hypertension, Peripheral Arterial Disease, Weeks Of Treatment: 14 History: Crohnos, Type II Diabetes Clustered Wound: No Photos Photo Uploaded By: Montey Hora on 03/02/2018 16:12:57 Wound Measurements Length: (cm) 0.3 Width: (cm) 0.2 Depth: (cm) 0.4 Area: (cm) 0.047 Volume: (cm) 0.019 % Reduction in Area: 83.4% % Reduction in Volume: 66.7% Epithelialization: Small (1-33%) Tunneling: No Undermining: No Wound Description Full Thickness Without Exposed  Support Classification: Structures Wound Margin: Flat and Intact Exudate Medium Amount: Exudate Type: Serous Exudate Color: amber Foul Odor After Cleansing: No Slough/Fibrino Yes Wound Bed Granulation Amount: Large (67-100%) Exposed Structure Granulation Quality: Red Fascia Exposed: No Necrotic Amount: Small (1-33%) Fat Layer (Subcutaneous Tissue) Exposed: Yes Necrotic Quality: Adherent Slough Tendon Exposed: No Muscle Exposed: No Joint Exposed: No Bone Exposed: No Brady, [name] ([id number]) Periwound Skin Texture Texture Color No Abnormalities Noted: No No Abnormalities Noted: No Callus: No Atrophie Blanche: No Crepitus: No Cyanosis: No Excoriation: Yes Ecchymosis: No Induration: No Erythema: No Rash: No Hemosiderin Staining: No Scarring: No Mottled: No Pallor: No Moisture Rubor: No No Abnormalities Noted: No Dry / Scaly: No Temperature / Pain Maceration: No Temperature: No Abnormality Wound Preparation Ulcer Cleansing: Rinsed/Irrigated with Saline Topical Anesthetic Applied: Other: lidocaine 4%, Treatment Notes Wound #2 (Right Amputation Site - Below Knee) 1. Cleansed with: Clean wound with Normal Saline 2. Anesthetic Topical Lidocaine 4% cream to wound bed prior to debridement 3. Peri-wound Care: Skin Prep 4. Dressing Applied: Calcium Alginate Other dressing (specify in notes) 5. Secondary Dressing Applied Bordered Foam Dressing Notes silvercel over the prisma Electronic Signature(s) Signed: 03/02/2018 4:44:42 PM By: Montey Hora Entered By: Montey Hora on 03/02/2018 09:59:04 Brady, [name] ([id number]) -------------------------------------------------------------------------------- [place] Details Patient Name: Tunney, Jared B. Date of Service: 03/02/2018 9:30 AM Medical Record Number: [id number] Patient Account Number: [id number] Date of Birth/Sex: 1961-09-22 (57 y.o. M) Treating RN: Montey Hora Primary Care Norwin Aleman: [name] Other Clinician: Referring Kalan Rinn: [name] Treating Tammara Massing/Extender: Cathie Olden in Treatment: 21 Vital Signs Time Taken: 09:51 Temperature (F): 98.2 Height (in): 71 Pulse (bpm): 59 Weight (lbs): 178 Respiratory Rate (breaths/min): 18 Body Mass Index (BMI): 24.8 Blood Pressure (mmHg): 132/72 Reference Range: 80 - 120 mg / dl Electronic Signature(s) Signed: 03/02/2018 4:44:42 PM By: Montey Hora Entered By: Montey Hora on 03/02/2018 09:58:43

## 2018-03-08 ENCOUNTER — Other Ambulatory Visit: Payer: Self-pay | Admitting: Family Medicine

## 2018-03-08 DIAGNOSIS — N186 End stage renal disease: Secondary | ICD-10-CM

## 2018-03-08 DIAGNOSIS — Z992 Dependence on renal dialysis: Principal | ICD-10-CM

## 2018-03-16 ENCOUNTER — Encounter: Payer: BLUE CROSS/BLUE SHIELD | Admitting: Nurse Practitioner

## 2018-03-16 DIAGNOSIS — E11622 Type 2 diabetes mellitus with other skin ulcer: Secondary | ICD-10-CM | POA: Diagnosis not present

## 2018-03-18 NOTE — Progress Notes (Signed)
KARMELO, BASS ([id number]) Visit Report for 01/05/2018 Chief Complaint Document Details Patient Name: [name], [name] Date of Service: 01/05/2018 2:30 PM Medical Record Number: [id number] Patient Account Number: [id number] Date of Birth/Sex: [birth year] (57 y.o. M) Treating RN: [name] Primary Care Provider: [name] Other Clinician: Referring Provider: [name] Treating Provider/Extender: Cathie Olden in Treatment: 13 Information Obtained from: Patient Chief Complaint He is here for evaluation of right bka stump ulcer Electronic Signature(s) Signed: 01/05/2018 2:59:00 PM By: Lawanda Cousins Entered By: Lawanda Cousins on 01/05/2018 14:59:00 Strand, Toribio Harbour ([id number]) -------------------------------------------------------------------------------- HPI Details Patient Name: Murata, [name] Date of Service: 01/05/2018 2:30 PM Medical Record Number: [id number] Patient Account Number: [id number] Date of Birth/Sex: Aug 11, 1961 (57 y.o. M) Treating RN: [name] Primary Care Provider: [name] Other Clinician: Referring Provider: [name] Treating Provider/Extender: Cathie Olden in Treatment: 13 History of Present Illness Location: Patient has a ulcer to Sacrum Quality: denies pain Severity: denies pain Duration: months, but unable to clearly articulate timeframe Context: ulcer occurred secondary to pressure HPI Description: 10-06-17 he is here for initial evaluation of the sacral ulcer. He is unable to articulate exactly this is a states "months". 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 He is currently in a facility, [facility]. He is unsure if he has a pressure reduction mattress. He admits to sitting the majority of the day and sleeping on his back. We discussed the need for lateral sleeping, which he states he is able to do. He voices no complaints of pain, discomfort, fever,  chills, ill feeling. He tolerated debridement. 10/26/17 on evaluation today patient sacral wound appears to be doing excellent. He has been tolerating the dressing changes without complication. He does have some discomfort especially when he has to sit in the chair at dialysis for an extended period of time. With that being said other than that he tries to stay offloaded as much as possible and I think he has done a very good job in that regard. There does not appear to be any new pressure injury. 11/10/17 He is here in follow up evaluation of a sacral ulcer. He is now home, have been using prisma. He presents with essentially unchanged measurements and increased in nonviable tissue. Prior to prisma they were using santyl, will transition back to santyl (spouse to call clinic if they do not have santyl at home). We discussed more aggressive offloading as he continues to sleep on his back, improved diet as he tends to eat "junk food". He does use a pressure redistribution cushion while in dialysis. Follow up in two weeks per their preference. 11/24/17-he is here in follow-up evaluation for a sacral ulcer and dates that his vascular surgeon at [facility] wants to "know if we can use the same medicine" on the BKA stump. Patient's bowel did contact the office, they do not have Santyl at home as originally thought. We will initiate Santyl to both wounds today. He states he does "the best I can" as far as repositioning but has inconsistent offloading while sleeping. He does use the chair cushion while in dialysis. Continue with follow-up in 2 weeks per their preference 12/22/17-he is here in follow-up evaluation for a sacral ulcer and right BKA stump ulcer. He was hospitalized at [facility] regional from 2/17-2/25 for sepsis due to right groin abscess, but her surgical consult note this is hidradenitis and required no surgical intervention. He was treated  on the inpatient side with daptomycin. He was discharged on  doxycycline which he has completed. he presents today with a healed sacral ulcer. He has a follow-up appointment at [facility] in a couple of weeks regarding the right BKA stump. He will follow-up in 2 weeks per his preference. 01/05/18-He is here in follow up evaluation for right BKA stump ulcer. There is improvement of this ulcer and we will continue with Santyl. He has seen vascular medicine at [facility] regarding this with no changes in treatment plan. He does have a history of hidradenitis and is following Dr. Otho Ket at [facility] dermatology which he saw Monday. He notes increased pain but no change in drainage to his groin. We will culture this today and hold off on antibiotic therapy until sensitivities resulted; he see Dr Otho Ket in three weeks Electronic Signature(s) Signed: 01/05/2018 3:02:41 PM By: Lawanda Cousins Entered By: Lawanda Cousins on 01/05/2018 15:02:41 Mcquilkin, Toribio Harbour ([id number]) -------------------------------------------------------------------------------- Physician Orders Details Patient Name: Velasquez, [name] Date of Service: 01/05/2018 2:30 PM Medical Record Number: [id number] Patient Account Number: [id number] Date of Birth/Sex: [birth year] (57 y.o. M) Treating RN: [name] Primary Care Provider: [name] Other Clinician: Referring Provider: [name] Treating Provider/Extender: Cathie Olden in Treatment: 88 Verbal / Phone Orders: Yes Clinician: Carolyne Fiscal, Debi Read Back and Verified: Yes Diagnosis Coding Wound Cleansing Wound #2 Right Amputation Site - Below Knee o Clean wound with Normal Saline. o May Shower, gently pat wound dry prior to applying new dressing. Anesthetic (add to Medication List) Wound #2 Right Amputation Site - Below Knee o Topical Lidocaine 4% cream applied to wound bed prior to debridement (In Clinic Only). Skin Barriers/Peri-Wound Care Wound #2 Right Amputation Site - Below Knee o Skin Prep Primary Wound Dressing Wound #2  Right Amputation Site - Below Knee o Santyl Ointment Secondary Dressing Wound #2 Right Amputation Site - Below Knee o Boardered Foam Dressing - or telfa island Dressing Change Frequency Wound #2 Right Amputation Site - Below Knee o Change dressing every day. Follow-up Appointments Wound #2 Right Amputation Site - Below Knee o Return Appointment in 2 weeks. Off-Loading Wound #2 Right Amputation Site - Below Knee o Roho cushion for wheelchair o Mattress o Turn and reposition every 2 hours Additional Orders / Instructions Wound #2 Right Amputation Site - Below Knee o Increase protein intake. Medications-please add to medication list. Shaker, BRONISLAW SWITZER. ([id number]) Wound #2 Right Amputation Site - Below Knee o Santyl Enzymatic Ointment Laboratory o Bacteria identified in Wound by Culture (MICRO) - right groin oooo LOINC Code: 5366-4 oooo Convenience Name: Wound culture routine Patient Medications Allergies: adhesive, cefepime, methotrexate, silicon, vancomycin Notifications Medication Indication Start End lidocaine DOSE 1 - topical 4 % cream - 1 cream topical Augmentin 01/10/2018 DOSE 1 - oral 875 mg-125 mg tablet - 1 tablet oral taken 2 times a day for 14 days. Take with a meal Electronic Signature(s) Signed: 01/10/2018 10:10:27 AM By: Worthy Keeler PA-C Signed: 03/17/2018 6:55:59 AM By: Lawanda Cousins Previous Signature: 01/06/2018 8:01:31 AM Version By: Lawanda Cousins Previous Signature: 01/09/2018 4:28:49 PM Version By: Alric Quan Entered By: Worthy Keeler on 01/10/2018 10:10:26 Corporan, Toribio Harbour ([id number]) -------------------------------------------------------------------------------- Prescription 01/05/2018 Patient Name: Podolsky, Easter B. Provider: Lawanda Cousins NP Date of Birth: [birth year] NPI#: [id number] Sex: Jerilynn Mages DEA#: [id number] Phone #: [phone] License #: Patient Address: [place] [address] [place] [facility] [place], [place] [address] [address] [place], [place] [address]  [phone] Allergies adhesive cefepime methotrexate silicon vancomycin Medication Medication: Route: Strength: Form: lidocaine 4 % topical cream topical 4% cream Class: TOPICAL LOCAL ANESTHETICS Dose: Frequency / Time: Indication: 1 1 cream topical Number of Refills: Number of Units: 0 Generic Substitution: Start Date: End Date: One Time Use: Substitution Permitted No Note to Pharmacy: Signature(s): Date(s): Lopez, MALEKE FERIA ([id number]) Electronic Signature(s) Signed: 01/11/2018 12:10:29 AM By: Worthy Keeler PA-C Signed: 03/17/2018 6:55:59 AM By: Lawanda Cousins Previous Signature: 01/06/2018 8:01:31 AM Version By: Lawanda Cousins Previous Signature: 01/09/2018 4:28:49 PM Version By: Alric Quan Entered By: Worthy Keeler on 01/10/2018 10:10:28 Pierro, Keionte B. ([id number]) --------------------------------------------------------------------------------  Problem List Details Patient Name: Lybeck, Larrell B. Date of Service: 01/05/2018 2:30 PM Medical Record Number: [id number] Patient Account Number: [id number] Date of Birth/Sex: 1961-10-15 (57 y.o. M) Treating RN: [name] Primary Care Provider: [name] Other Clinician: Referring Provider: [name] Treating Provider/Extender: Cathie Olden in Treatment: 13 Active Problems ICD-10 Impacting Encounter Code Description Active Date Wound Healing Diagnosis S81.801S Unspecified open wound, right lower leg, sequela 11/24/2017 No Yes Z89.511 Acquired absence of right leg below knee 10/06/2017 No Yes N18.6 End stage renal disease 10/06/2017 No Yes E11.622 Type 2 diabetes mellitus with other skin ulcer 10/06/2017 No Yes T81.31XS Disruption of external operation (surgical) wound, not 11/24/2017 No Yes elsewhere classified, sequela L73.2 Hidradenitis suppurativa 01/05/2018 No Yes Inactive  Problems Resolved Problems ICD-10 Code Description Active Date Resolved Date L89.153 Pressure ulcer of sacral region, stage 3 10/06/2017 12/22/2017 Electronic Signature(s) Signed: 01/05/2018 2:56:52 PM By: Lawanda Cousins Previous Signature: 01/05/2018 2:55:42 PM Version By: Lawanda Cousins Entered By: Lawanda Cousins on 01/05/2018 14:56:52 Donaghey, Taro B. ([id number]) -------------------------------------------------------------------------------- Progress Note Details Patient Name: Mesenbrink, Agapito B. Date of Service: 01/05/2018 2:30 PM Medical Record Number: [id number] Patient Account Number: [id number] Date of Birth/Sex: 05/09/1961 (57 y.o. M) Treating RN: [name] Primary Care Provider: [name] Other Clinician: Referring Provider: [name] Treating Provider/Extender: Cathie Olden in Treatment: 13 Subjective Chief Complaint Information obtained from Patient He is here for evaluation of right bka stump ulcer History of Present Illness (HPI) The following HPI elements were documented for the patient's wound: Location: Patient has a ulcer to Sacrum Quality: denies pain Severity: denies pain Duration: months, but unable to clearly articulate timeframe Context: ulcer occurred secondary to pressure 10-06-17 he is here for initial evaluation of the sacral ulcer. He is unable to articulate exactly this is a states "months". 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 He is currently in a facility, [facility]. He is unsure if he has a pressure reduction mattress. He admits to sitting the majority of the day and sleeping on his back. We discussed the need for lateral sleeping, which he states he is able to do. He voices no complaints of pain, discomfort, fever, chills, ill feeling. He tolerated debridement. 10/26/17 on evaluation today patient sacral wound appears to be doing excellent. He has been tolerating the dressing  changes without complication. He does have some discomfort especially when he has to sit in the chair at dialysis for an extended period of time. With that being said other than that he tries to stay offloaded as much as possible and I think he has done a very good job in that regard. There does not appear to be any new pressure injury. 11/10/17 He is here in follow up evaluation of a sacral ulcer. He is now  home, have been using prisma. He presents with essentially unchanged measurements and increased in nonviable tissue. Prior to prisma they were using santyl, will transition back to santyl (spouse to call clinic if they do not have santyl at home). We discussed more aggressive offloading as he continues to sleep on his back, improved diet as he tends to eat "junk food". He does use a pressure redistribution cushion while in dialysis. Follow up in two weeks per their preference. 11/24/17-he is here in follow-up evaluation for a sacral ulcer and dates that his vascular surgeon at [facility] wants to "know if we can use the same medicine" on the BKA stump. Patient's bowel did contact the office, they do not have Santyl at home as originally thought. We will initiate Santyl to both wounds today. He states he does "the best I can" as far as repositioning but has inconsistent offloading while sleeping. He does use the chair cushion while in dialysis. Continue with follow-up in 2 weeks per their preference 12/22/17-he is here in follow-up evaluation for a sacral ulcer and right BKA stump ulcer. He was hospitalized at [facility] regional from 2/17-2/25 for sepsis due to right groin abscess, but her surgical consult note this is hidradenitis and required no surgical intervention. He was treated on the inpatient side with daptomycin. He was discharged on doxycycline which he has completed. he presents today with a healed sacral ulcer. He has a follow-up appointment at [facility] in a couple of weeks regarding the right BKA  stump. He will follow-up in 2 weeks per his preference. 01/05/18-He is here in follow up evaluation for right BKA stump ulcer. There is improvement of this ulcer and we will continue with Santyl. He has seen vascular medicine at [facility] regarding this with no changes in treatment plan. He does have a history of hidradenitis and is following Dr. Otho Ket at [facility] dermatology which he saw Monday. He notes increased pain but no change in drainage to his groin. We will culture this today and hold off on antibiotic therapy until sensitivities resulted; he see Dr Otho Ket in three weeks Amstutz, Taytum B. ([id number]) Patient History Information obtained from Patient. Family History Diabetes - Mother,Siblings, No family history of Cancer, Heart Disease, Hereditary Spherocytosis, Hypertension, Kidney Disease, Lung Disease, Seizures, Stroke, Thyroid Problems, Tuberculosis. Social History Former smoker - quit 20+ years ago, Marital Status - Married, Alcohol Use - Never, Drug Use - No History, Caffeine Use - Daily. Medical And Surgical History Notes Genitourinary renal disorder - CKD Neurologic CVA - 3 months ago Objective Constitutional Vitals Time Taken: 2:29 PM, Height: 71 in, Weight: 178 lbs, BMI: 24.8, Temperature: 97.8 F, Pulse: 59 bpm, Respiratory Rate: 18 breaths/min, Blood Pressure: 128/67 mmHg. Integumentary (Hair, Skin) Wound #2 status is Open. Original cause of wound was Surgical Injury. The wound is located on the Right Amputation Site - Below Knee. The wound measures 0.2cm length x 0.2cm width x 0.4cm depth; 0.031cm^2 area and 0.013cm^3 volume. There is Fat Layer (Subcutaneous Tissue) Exposed exposed. There is no tunneling or undermining noted. There is a large amount of serous drainage noted. The wound margin is flat and intact. There is no granulation within the wound bed. There is a large (67-100%) amount of necrotic tissue within the wound bed including Eschar. The periwound skin appearance  did not exhibit: Callus, Crepitus, Excoriation, Induration, Rash, Scarring, Dry/Scaly, Maceration, Atrophie Blanche, Cyanosis, Ecchymosis, Hemosiderin Staining, Mottled, Pallor, Rubor, Erythema. Periwound temperature was noted as No Abnormality. Assessment Active Problems ICD-10 M84.132G -  Unspecified open wound, right lower leg, sequela Z89.511 - Acquired absence of right leg below knee N18.6 - End stage renal disease E11.622 - Type 2 diabetes mellitus with other skin ulcer T81.31XS - Disruption of external operation (surgical) wound, not elsewhere classified, sequela L73.2 - Hidradenitis suppurativa Gabler, Estell B. ([id number]) Plan Wound Cleansing: Wound #2 Right Amputation Site - Below Knee: Clean wound with Normal Saline. May Shower, gently pat wound dry prior to applying new dressing. Anesthetic (add to Medication List): Wound #2 Right Amputation Site - Below Knee: Topical Lidocaine 4% cream applied to wound bed prior to debridement (In Clinic Only). Skin Barriers/Peri-Wound Care: Wound #2 Right Amputation Site - Below Knee: Skin Prep Primary Wound Dressing: Wound #2 Right Amputation Site - Below Knee: Santyl Ointment Secondary Dressing: Wound #2 Right Amputation Site - Below Knee: Boardered Foam Dressing - or telfa island Dressing Change Frequency: Wound #2 Right Amputation Site - Below Knee: Change dressing every day. Follow-up Appointments: Wound #2 Right Amputation Site - Below Knee: Return Appointment in 2 weeks. Off-Loading: Wound #2 Right Amputation Site - Below Knee: Roho cushion for wheelchair Mattress Turn and reposition every 2 hours Additional Orders / Instructions: Wound #2 Right Amputation Site - Below Knee: Increase protein intake. Medications-please add to medication list.: Wound #2 Right Amputation Site - Below Knee: Santyl Enzymatic Ointment Laboratory ordered were: Wound culture routine - right groin The following medication(s) was  prescribed: lidocaine topical 4 % cream 1 1 cream topical was prescribed at facility Augmentin oral 875 mg-125 mg tablet 1 1 tablet oral taken 2 times a day for 14 days. Take with a meal starting 01/10/2018 1. santly daily BKA stump 2. culture right groin drainage- antibiotic per sensitivity results 3. follow up in two weeks Electronic Signature(s) Breese, OTTIE TILLERY ([id number]) Signed: 01/12/2018 9:05:59 PM By: Lawanda Cousins Previous Signature: 01/05/2018 3:03:38 PM Version By: Lawanda Cousins Entered By: Lawanda Cousins on 01/12/2018 21:05:59 Cockerell, Toribio Harbour ([id number]) -------------------------------------------------------------------------------- ROS/PFSH Details Patient Name: Heward, Fairley B. Date of Service: 01/05/2018 2:30 PM Medical Record Number: [id number] Patient Account Number: [id number] Date of Birth/Sex: [birth year] (57 y.o. M) Treating RN: [name] Primary Care Provider: [name] Other Clinician: Referring Provider: [name] Treating Provider/Extender: Cathie Olden in Treatment: 13 Information Obtained From Patient Wound History Do you currently have one or more open woundso Yes How many open wounds do you currently haveo 1 Approximately how long have you had your woundso about 2 months How have you been treating your wound(s) until nowo santyl Has your wound(s) ever healed and then re-openedo No Have you had any lab work done in the past montho No Have you tested positive for an antibiotic resistant organism (MRSA, VRE)o No Have you tested positive for osteomyelitis (bone infection)o No Have you had any tests for circulation on your legso Yes Who ordered the testo PCP Where was the test doneo [facility] Medical History: Negative for: Cataracts; Glaucoma; Optic Neuritis Hematologic/Lymphatic Medical History: Negative for: Anemia; Hemophilia; Human Immunodeficiency Virus; Lymphedema; Sickle Cell Disease Respiratory Medical History: Negative  for: Aspiration; Asthma; Chronic Obstructive Pulmonary Disease (COPD); Pneumothorax; Sleep Apnea; Tuberculosis Cardiovascular Medical History: Positive for: Hypertension; Peripheral Arterial Disease Negative for: Angina; Arrhythmia; Congestive Heart Failure; Coronary Artery Disease; Deep Vein Thrombosis; Hypotension; Myocardial Infarction; Peripheral Venous Disease; Phlebitis; Vasculitis Gastrointestinal Medical History: Positive for: Crohnos Negative for: Cirrhosis ; Colitis; Hepatitis A; Hepatitis B; Hepatitis C Endocrine Medical History: Positive for: Type II Diabetes Slates, Jaiden B. ([id number]) Time with diabetes: 20+  years Treated with: Insulin Blood sugar tested every day: Yes Tested : Genitourinary Medical History: Past Medical History Notes: renal disorder - CKD Immunological Medical History: Negative for: Lupus Erythematosus; Raynaudos; Scleroderma Musculoskeletal Medical History: Negative for: Gout; Rheumatoid Arthritis; Osteoarthritis; Osteomyelitis Neurologic Medical History: Negative for: Dementia; Neuropathy; Quadriplegia; Paraplegia; Seizure Disorder Past Medical History Notes: CVA - 3 months ago Oncologic Medical History: Negative for: Received Chemotherapy; Received Radiation Immunizations Pneumococcal Vaccine: Received Pneumococcal Vaccination: Yes Implantable Devices Family and Social History Cancer: No; Diabetes: Yes - Mother,Siblings; Heart Disease: No; Hereditary Spherocytosis: No; Hypertension: No; Kidney Disease: No; Lung Disease: No; Seizures: No; Stroke: No; Thyroid Problems: No; Tuberculosis: No; Former smoker - quit 20+ years ago; Marital Status - Married; Alcohol Use: Never; Drug Use: No History; Caffeine Use: Daily; Financial Concerns: No; Food, Clothing or Shelter Needs: No; Support System Lacking: No; Transportation Concerns: No; Advanced Directives: No; Patient does not want information on Advanced Directives Physician Affirmation I  have reviewed and agree with the above information. Electronic Signature(s) Signed: 01/06/2018 8:01:31 AM By: Lawanda Cousins Signed: 01/09/2018 4:28:49 PM By: Alric Quan Entered By: Lawanda Cousins on 01/05/2018 15:02:48 Zapien, Toribio Harbour ([id number]) -------------------------------------------------------------------------------- SuperBill Details Patient Name: Wohler, Errick B. Date of Service: 01/05/2018 Medical Record Number: [id number] Patient Account Number: [id number] Date of Birth/Sex: 1961-10-09 (57 y.o. M) Treating RN: [name] Primary Care Provider: [name] Other Clinician: Referring Provider: [name] Treating Provider/Extender: Cathie Olden in Treatment: 13 Diagnosis Coding ICD-10 Codes Code Description Y40.347Q Unspecified open wound, right lower leg, sequela Z89.511 Acquired absence of right leg below knee N18.6 End stage renal disease E11.622 Type 2 diabetes mellitus with other skin ulcer T81.31XS Disruption of external operation (surgical) wound, not elsewhere classified, sequela L73.2 Hidradenitis suppurativa Facility Procedures CPT4 Code: [id number] Description: 99213 - WOUND CARE VISIT-LEV 3 EST PT Modifier: Quantity: 1 Physician Procedures CPT4 Code: [id number] Description: 99213 - WC PHYS LEVEL 3 - EST PT ICD-10 Diagnosis Description S81.801S Unspecified open wound, right lower leg, sequela L73.2 Hidradenitis suppurativa Modifier: Quantity: 1 Electronic Signature(s) Signed: 01/06/2018 4:20:01 PM By: Alric Quan Signed: 01/07/2018 8:13:54 PM By: Lawanda Cousins Previous Signature: 01/05/2018 3:03:52 PM Version By: Lawanda Cousins Entered By: Alric Quan on 01/06/2018 16:20:01

## 2018-03-19 NOTE — Progress Notes (Signed)
[name] ([id number]) Visit Report for 03/16/2018 Arrival Information Details Patient Name: [name], [name] Date of Service: 03/16/2018 10:00 AM Medical Record Number: [id number] Patient Account Number: [id number] Date of Birth/Sex: 03/04/1961 (57 y.o. M) Treating RN: Montey Hora Primary Care Ahmir Bracken: [name] Other Clinician: Referring Asiah Befort: [name] Treating Devon Pretty/Extender: Cathie Olden in Treatment: 23 Visit Information History Since Last Visit Added or deleted any medications: No Patient Arrived: Wheel Chair Any new allergies or adverse reactions: No Arrival Time: 10:28 Had a fall or experienced change in No activities of daily living that may affect Accompanied By: spouse risk of falls: Transfer Assistance: None Signs or symptoms of abuse/neglect since last visito No Patient Identification Verified: Yes Hospitalized since last visit: No Secondary Verification Process Completed: Yes Implantable device outside of the clinic excluding No Patient Requires Transmission-Based No cellular tissue based products placed in the center Precautions: since last visit: Patient Has Alerts: Yes Has Dressing in Place as Prescribed: Yes Patient Alerts: DMII Pain Present Now: No Electronic Signature(s) Signed: 03/16/2018 4:32:43 PM By: Montey Hora Entered By: Montey Hora on 03/16/2018 10:29:22 [name], [name] ([id number]) -------------------------------------------------------------------------------- Clinic Level of Care Assessment Details Patient Name: [name], [name] Date of Service: 03/16/2018 10:00 AM Medical Record Number: [id number] Patient Account Number: [id number] Date of Birth/Sex: 04/17/1961 (57 y.o. M) Treating RN: [name] Primary Care Enaya Howze: [name] Other Clinician: Referring Haani Bakula: [name] Treating Kyrra Prada/Extender: Cathie Olden in Treatment: 23 Clinic Level of Care Assessment Items TOOL 4  Quantity Score X - Use when only an EandM is performed on FOLLOW-UP visit 1 0 ASSESSMENTS - Nursing Assessment / Reassessment X - Reassessment of Co-morbidities (includes updates in patient status) 1 10 X- 1 5 Reassessment of Adherence to Treatment Plan ASSESSMENTS - Wound and Skin Assessment / Reassessment X - Simple Wound Assessment / Reassessment - one wound 1 5 []  - 0 Complex Wound Assessment / Reassessment - multiple wounds []  - 0 Dermatologic / Skin Assessment (not related to wound area) ASSESSMENTS - Focused Assessment []  - Circumferential Edema Measurements - multi extremities 0 []  - 0 Nutritional Assessment / Counseling / Intervention []  - 0 Lower Extremity Assessment (monofilament, tuning fork, pulses) []  - 0 Peripheral Arterial Disease Assessment (using hand held doppler) ASSESSMENTS - Ostomy and/or Continence Assessment and Care []  - Incontinence Assessment and Management 0 []  - 0 Ostomy Care Assessment and Management (repouching, etc.) PROCESS - Coordination of Care X - Simple Patient / Family Education for ongoing care 1 15 []  - 0 Complex (extensive) Patient / Family Education for ongoing care []  - 0 Staff obtains Programmer, systems, Records, Test Results / Process Orders []  - 0 Staff telephones HHA, Nursing Homes / Clarify orders / etc []  - 0 Routine Transfer to another Facility (non-emergent condition) []  - 0 Routine Hospital Admission (non-emergent condition) []  - 0 New Admissions / Biomedical engineer / Ordering NPWT, Apligraf, etc. []  - 0 Emergency Hospital Admission (emergent condition) X- 1 10 Simple Discharge Coordination [name], [name] ([id number]) []  - 0 Complex (extensive) Discharge Coordination PROCESS - Special Needs []  - Pediatric / Minor Patient Management 0 []  - 0 Isolation Patient Management []  - 0 Hearing / Language / Visual special needs []  - 0 Assessment of Community assistance (transportation, D/C planning, etc.) []  - 0 Additional  assistance / Altered mentation []  - 0 Support Surface(s) Assessment (bed, cushion, seat, etc.) INTERVENTIONS - Wound Cleansing / Measurement X - Simple Wound Cleansing - one wound 1 5 []  -  0 Complex Wound Cleansing - multiple wounds X- 1 5 Wound Imaging (photographs - any number of wounds) []  - 0 Wound Tracing (instead of photographs) X- 1 5 Simple Wound Measurement - one wound []  - 0 Complex Wound Measurement - multiple wounds INTERVENTIONS - Wound Dressings X - Small Wound Dressing one or multiple wounds 1 10 []  - 0 Medium Wound Dressing one or multiple wounds []  - 0 Large Wound Dressing one or multiple wounds X- 1 5 Application of Medications - topical []  - 0 Application of Medications - injection INTERVENTIONS - Miscellaneous []  - External ear exam 0 []  - 0 Specimen Collection (cultures, biopsies, blood, body fluids, etc.) []  - 0 Specimen(s) / Culture(s) sent or taken to Lab for analysis []  - 0 Patient Transfer (multiple staff / Civil Service fast streamer / Similar devices) []  - 0 Simple Staple / Suture removal (25 or less) []  - 0 Complex Staple / Suture removal (26 or more) []  - 0 Hypo / Hyperglycemic Management (close monitor of Blood Glucose) []  - 0 Ankle / Brachial Index (ABI) - do not check if billed separately X- 1 5 Vital Signs [name], [name] ([id number]) Has the patient been seen at the hospital within the last three years: Yes Total Score: 80 Level Of Care: New/Established - Level 3 Electronic Signature(s) Signed: 03/17/2018 5:38:20 PM By: Alric Quan Entered By: Alric Quan on 03/16/2018 17:11:42 [name], [name] ([id number]) -------------------------------------------------------------------------------- Complex / Palliative Patient Assessment Details Patient Name: [name], [name] Date of Service: 03/16/2018 10:00 AM Medical Record Number: [id number] Patient Account Number: [id number] Date of Birth/Sex: May 25, 1961 (57 y.o. M) Treating RN: [name] Primary Care Ahmaya Ostermiller: [name] Other Clinician: Referring Bemnet Trovato: [name] Treating Oluwanifemi Susman/Extender: Cathie Olden in Treatment: 23 Palliative Management Criteria Complex Wound Management Criteria Patient has remarkable or complex co-morbidities requiring medications or treatments that extend wound healing times. Examples: o Diabetes mellitus with chronic renal failure or end stage renal disease requiring dialysis o Advanced or poorly controlled rheumatoid arthritis o Diabetes mellitus and end stage chronic obstructive pulmonary disease o Active cancer with current chemo- or radiation therapy DM/BKA/ESRD Care Approach Wound Care Plan: Complex Wound Management Electronic Signature(s) Signed: 03/16/2018 5:49:31 PM By: Gretta Cool, BSN, RN, CWS, Kim RN, BSN Signed: 03/17/2018 6:54:27 AM By: Lawanda Cousins Entered By: Gretta Cool BSN, RN, CWS, Kim on 03/16/2018 17:49:31 Soper, Toribio Harbour ([id number]) -------------------------------------------------------------------------------- Encounter Discharge Information Details Patient Name: Lindon, [name] Date of Service: 03/16/2018 10:00 AM Medical Record Number: [id number] Patient Account Number: [id number] Date of Birth/Sex: 1961-09-18 (57 y.o. M) Treating RN: Roger Shelter Primary Care Darline Faith: [name] Other Clinician: Referring Daney Moor: [name] Treating Pius Byrom/Extender: Cathie Olden in Treatment: 47 Encounter Discharge Information Items Discharge Condition: Stable Ambulatory Status: Wheelchair Discharge Destination: Home Transportation: Private Auto Accompanied By: wife Schedule Follow-up Appointment: Yes Clinical Summary of Care: Electronic Signature(s) Signed: 03/16/2018 11:59:50 AM By: Roger Shelter Entered By: Roger Shelter on 03/16/2018 11:13:23 Flanery, Toribio Harbour ([id number]) -------------------------------------------------------------------------------- Lower  Extremity Assessment Details Patient Name: Charrette, Henley B. Date of Service: 03/16/2018 10:00 AM Medical Record Number: [id number] Patient Account Number: [id number] Date of Birth/Sex: August 28, 1961 (57 y.o. M) Treating RN: Montey Hora Primary Care Karcyn Menn: [name] Other Clinician: Referring Jennica Tagliaferri: [name] Treating Kiari Hosmer/Extender: Lawanda Cousins Weeks in Treatment: 23 Edema Assessment Assessed: [Left: No] [Right: No] Edema: [Left: N] [Right: o] Vascular Assessment Pulses: Posterior Tibial Popliteal Palpable: [Right:Yes] Extremity colors, hair growth, and conditions: Extremity Color: [Right:Normal] Hair Growth on Extremity: [Right:Yes] Temperature of Extremity: [  Right:Warm] Capillary Refill: [Right:< 3 seconds] Electronic Signature(s) Signed: 03/16/2018 4:32:43 PM By: Montey Hora Entered By: Montey Hora on 03/16/2018 10:38:16 Grismore, Toribio Harbour ([id number]) -------------------------------------------------------------------------------- Multi Wound Chart Details Patient Name: Leino, [name] Date of Service: 03/16/2018 10:00 AM Medical Record Number: [id number] Patient Account Number: [id number] Date of Birth/Sex: 1961/01/04 (57 y.o. M) Treating RN: [name] Primary Care Rodneshia Greenhouse: [name] Other Clinician: Referring Mattheo Swindle: [name] Treating Irianna Gilday/Extender: Cathie Olden in Treatment: 23 Vital Signs Height(in): 71 Pulse(bpm): 42 Weight(lbs): 178 Blood Pressure(mmHg): 158/80 Body Mass Index(BMI): 25 Temperature(F): 98.4 Respiratory Rate 18 (breaths/min): Photos: [N/A:N/A] Wound Location: Right Amputation Site - Below N/A N/A Knee Wounding Event: Surgical Injury N/A N/A Primary Etiology: Open Surgical Wound N/A N/A Comorbid History: Hypertension, Peripheral N/A N/A Arterial Disease, Crohnos, Type II Diabetes Date Acquired: 06/27/2017 N/A N/A Weeks of Treatment: 16 N/A N/A Wound Status: Open N/A N/A Measurements  L x W x D 0.2x0.2x0.3 N/A N/A (cm) Area (cm) : 0.031 N/A N/A Volume (cm) : 0.009 N/A N/A % Reduction in Area: 89.00% N/A N/A % Reduction in Volume: 84.20% N/A N/A Classification: Full Thickness Without N/A N/A Exposed Support Structures Exudate Amount: Small N/A N/A Exudate Type: Serous N/A N/A Exudate Color: amber N/A N/A Wound Margin: Flat and Intact N/A N/A Granulation Amount: Large (67-100%) N/A N/A Granulation Quality: Red N/A N/A Necrotic Amount: Small (1-33%) N/A N/A Exposed Structures: Fat Layer (Subcutaneous N/A N/A Tissue) Exposed: Yes Fascia: No Tendon: No Lippert, [name] ([id number]) Muscle: No Joint: No Bone: No Epithelialization: Small (1-33%) N/A N/A Periwound Skin Texture: Excoriation: Yes N/A N/A Induration: No Callus: No Crepitus: No Rash: No Scarring: No Periwound Skin Moisture: Maceration: No N/A N/A Dry/Scaly: No Periwound Skin Color: Atrophie Blanche: No N/A N/A Cyanosis: No Ecchymosis: No Erythema: No Hemosiderin Staining: No Mottled: No Pallor: No Rubor: No Temperature: No Abnormality N/A N/A Tenderness on Palpation: No N/A N/A Wound Preparation: Ulcer Cleansing: N/A N/A Rinsed/Irrigated with Saline Topical Anesthetic Applied: Other: lidocaine 4% Treatment Notes Wound #2 (Right Amputation Site - Below Knee) 1. Cleansed with: Clean wound with Normal Saline 2. Anesthetic Topical Lidocaine 4% cream to wound bed prior to debridement 3. Peri-wound Care: Skin Prep Other peri-wound care (specify in notes) 5. Secondary Dressing Applied Bordered Foam Dressing Notes triple antibiotic cream Electronic Signature(s) Signed: 03/16/2018 11:51:44 AM By: Lawanda Cousins Entered By: Lawanda Cousins on 03/16/2018 11:51:44 Okey, Toribio Harbour ([id number]) -------------------------------------------------------------------------------- [place] Details Patient Name: Graham, [name] Date of Service: 03/16/2018 10:00 AM Medical  Record Number: [id number] Patient Account Number: [id number] Date of Birth/Sex: July 31, 1961 (57 y.o. M) Treating RN: [name] Primary Care Rayne Loiseau: [name] Other Clinician: Referring Taelar Gronewold: [name] Treating Kyri Dai/Extender: Cathie Olden in Treatment: 56 Active Inactive ` Abuse / Safety / Falls / Self Care Management Nursing Diagnoses: Impaired physical mobility Goals: Patient will remain injury free related to falls Date Initiated: 10/06/2017 Target Resolution Date: 12/24/2017 Goal Status: Active Interventions: Assess fall risk on admission and as needed Notes: ` Orientation to the Wound Care Program Nursing Diagnoses: Knowledge deficit related to the wound healing center program Goals: Patient/caregiver will verbalize understanding of the [place] Date Initiated: 10/06/2017 Target Resolution Date: 12/24/2017 Goal Status: Active Interventions: Provide education on orientation to the wound center Notes: ` Pressure Nursing Diagnoses: Knowledge deficit related to management of pressures ulcers Goals: Patient will remain free from development of additional pressure ulcers Date Initiated: 11/10/2017 Target Resolution Date: 11/25/2017 Goal Status: Active Interventions: Murri,  [name] ([id number]) Assess: immobility, friction, shearing, incontinence upon admission and as needed Assess offloading mechanisms upon admission and as needed Treatment Activities: Patient referred for pressure reduction/relief devices : 11/10/2017 Notes: ` Wound/Skin Impairment Nursing Diagnoses: Impaired tissue integrity Goals: Ulcer/skin breakdown will heal within 14 weeks Date Initiated: 10/06/2017 Target Resolution Date: 12/24/2017 Goal Status: Active Interventions: Assess patient/caregiver ability to obtain necessary supplies Assess patient/caregiver ability to perform ulcer/skin care regimen upon admission and as needed Assess ulceration(s)  every visit Notes: Electronic Signature(s) Signed: 03/17/2018 5:38:20 PM By: Alric Quan Entered By: Alric Quan on 03/16/2018 10:48:37 Deardorff, [name] ([id number]) -------------------------------------------------------------------------------- Pain Assessment Details Patient Name: Huizinga, [name] Date of Service: 03/16/2018 10:00 AM Medical Record Number: [id number] Patient Account Number: [id number] Date of Birth/Sex: 06-18-1961 (57 y.o. M) Treating RN: Montey Hora Primary Care Beverly Ferner: [name] Other Clinician: Referring Jessilyn Catino: [name] Treating Shlome Baldree/Extender: Cathie Olden in Treatment: 23 Active Problems Location of Pain Severity and Description of Pain Patient Has Paino No Site Locations Pain Management and Medication Current Pain Management: Electronic Signature(s) Signed: 03/16/2018 4:32:43 PM By: Montey Hora Entered By: Montey Hora on 03/16/2018 10:29:29 Meine, Toribio Harbour ([id number]) -------------------------------------------------------------------------------- Patient/Caregiver Education Details Patient Name: Debenedetto, [name] Date of Service: 03/16/2018 10:00 AM Medical Record Number: [id number] Patient Account Number: [id number] Date of Birth/Gender: [birth year] (57 y.o. M) Treating RN: Roger Shelter Primary Care Physician: [name] Other Clinician: Referring Physician: [name] Treating Physician/Extender: Cathie Olden in Treatment: 57 Education Assessment Education Provided To: Patient Education Topics Provided Wound/Skin Impairment: Handouts: Caring for Your Ulcer Methods: Explain/Verbal Responses: State content correctly Electronic Signature(s) Signed: 03/16/2018 11:59:50 AM By: Roger Shelter Entered By: Roger Shelter on 03/16/2018 11:14:01 Wiesen, [name] ([id number]) -------------------------------------------------------------------------------- Wound Assessment Details Patient  Name: Regner, [name] Date of Service: 03/16/2018 10:00 AM Medical Record Number: [id number] Patient Account Number: [id number] Date of Birth/Sex: September 20, 1961 (57 y.o. M) Treating RN: Montey Hora Primary Care Elery Cadenhead: [name] Other Clinician: Referring Arthor Gorter: [name] Treating Undray Allman/Extender: Lawanda Cousins Weeks in Treatment: 23 Wound Status Wound Number: 2 Primary Open Surgical Wound Etiology: Wound Location: Right Amputation Site - Below Knee Wound Open Wounding Event: Surgical Injury Status: Date Acquired: 06/27/2017 Comorbid Hypertension, Peripheral Arterial Disease, Weeks Of Treatment: 16 History: Crohnos, Type II Diabetes Clustered Wound: No Photos Photo Uploaded By: Montey Hora on 03/16/2018 10:50:30 Wound Measurements Length: (cm) 0.2 Width: (cm) 0.2 Depth: (cm) 0.3 Area: (cm) 0.031 Volume: (cm) 0.009 % Reduction in Area: 89% % Reduction in Volume: 84.2% Epithelialization: Small (1-33%) Tunneling: No Undermining: No Wound Description Full Thickness Without Exposed Support Classification: Structures Wound Margin: Flat and Intact Exudate Small Amount: Exudate Type: Serous Exudate Color: amber Foul Odor After Cleansing: No Slough/Fibrino Yes Wound Bed Granulation Amount: Large (67-100%) Exposed Structure Granulation Quality: Red Fascia Exposed: No Necrotic Amount: Small (1-33%) Fat Layer (Subcutaneous Tissue) Exposed: Yes Necrotic Quality: Adherent Slough Tendon Exposed: No Muscle Exposed: No Joint Exposed: No Bone Exposed: No Hunkele, [name] ([id number]) Periwound Skin Texture Texture Color No Abnormalities Noted: No No Abnormalities Noted: No Callus: No Atrophie Blanche: No Crepitus: No Cyanosis: No Excoriation: Yes Ecchymosis: No Induration: No Erythema: No Rash: No Hemosiderin Staining: No Scarring: No Mottled: No Pallor: No Moisture Rubor: No No Abnormalities Noted: No Dry / Scaly: No Temperature /  Pain Maceration: No Temperature: No Abnormality Wound Preparation Ulcer Cleansing: Rinsed/Irrigated with Saline Topical Anesthetic Applied: Other: lidocaine 4%, Treatment Notes Wound #2 (Right Amputation Site - Below Knee) 1. Cleansed with: Clean  wound with Normal Saline 2. Anesthetic Topical Lidocaine 4% cream to wound bed prior to debridement 3. Peri-wound Care: Skin Prep Other peri-wound care (specify in notes) 5. Secondary Dressing Applied Bordered Foam Dressing Notes triple antibiotic cream Electronic Signature(s) Signed: 03/16/2018 4:32:43 PM By: Montey Hora Entered By: Montey Hora on 03/16/2018 10:37:52 Snipe, Toribio Harbour ([id number]) -------------------------------------------------------------------------------- [place] Details Patient Name: Starzyk, [name] Date of Service: 03/16/2018 10:00 AM Medical Record Number: [id number] Patient Account Number: [id number] Date of Birth/Sex: 05-Feb-1961 (57 y.o. M) Treating RN: Montey Hora Primary Care Lajuan Godbee: [name] Other Clinician: Referring Efosa Treichler: [name] Treating Tajuanna Burnett/Extender: Cathie Olden in Treatment: 23 Vital Signs Time Taken: 10:30 Temperature (F): 98.4 Height (in): 71 Pulse (bpm): 62 Weight (lbs): 178 Respiratory Rate (breaths/min): 18 Body Mass Index (BMI): 24.8 Blood Pressure (mmHg): 158/80 Reference Range: 80 - 120 mg / dl Electronic Signature(s) Signed: 03/16/2018 4:32:43 PM By: Montey Hora Entered By: Montey Hora on 03/16/2018 10:38:29

## 2018-03-19 NOTE — Progress Notes (Signed)
Beyene, EVERSON MOTT ([id number]) Visit Report for 03/16/2018 Chief Complaint Document Details Patient Name: Cui, Airam B. Date of Service: 03/16/2018 10:00 AM Medical Record Number: [id number] Patient Account Number: [id number] Date of Birth/Sex: 08-12-1961 (57 y.o. M) Treating RN: [name] Primary Care Provider: [name] Other Clinician: Referring Provider: [name] Treating Provider/Extender: Cathie Olden in Treatment: 23 Information Obtained from: Patient Chief Complaint He is here for evaluation of right bka stump ulcer Electronic Signature(s) Signed: 03/16/2018 11:52:13 AM By: Lawanda Cousins Entered By: Lawanda Cousins on 03/16/2018 11:52:12 Vazques, Duvall B. ([id number]) -------------------------------------------------------------------------------- HPI Details Patient Name: Gauthier, Yuepheng B. Date of Service: 03/16/2018 10:00 AM Medical Record Number: [id number] Patient Account Number: [id number] Date of Birth/Sex: Dec 17, 1960 (57 y.o. M) Treating RN: [name] Primary Care Provider: [name] Other Clinician: Referring Provider: [name] Treating Provider/Extender: Cathie Olden in Treatment: 23 History of Present Illness Location: Patient has a ulcer to Sacrum Quality: denies pain Severity: denies pain Duration: months, but unable to clearly articulate timeframe Context: ulcer occurred secondary to pressure HPI Description: 10-06-17 he is here for initial evaluation of the sacral ulcer. He is unable to articulate exactly this is a states "months". 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 He is currently in a facility, [facility]. He is unsure if he has a pressure reduction mattress. He admits to sitting the majority of the day and sleeping on his back. We discussed the need for lateral sleeping, which he states he is able to do. He voices no complaints of pain, discomfort,  fever, chills, ill feeling. He tolerated debridement. 10/26/17 on evaluation today patient sacral wound appears to be doing excellent. He has been tolerating the dressing changes without complication. He does have some discomfort especially when he has to sit in the chair at dialysis for an extended period of time. With that being said other than that he tries to stay offloaded as much as possible and I think he has done a very good job in that regard. There does not appear to be any new pressure injury. 11/10/17 He is here in follow up evaluation of a sacral ulcer. He is now home, have been using prisma. He presents with essentially unchanged measurements and increased in nonviable tissue. Prior to prisma they were using santyl, will transition back to santyl (spouse to call clinic if they do not have santyl at home). We discussed more aggressive offloading as he continues to sleep on his back, improved diet as he tends to eat "junk food". He does use a pressure redistribution cushion while in dialysis. Follow up in two weeks per their preference. 11/24/17-he is here in follow-up evaluation for a sacral ulcer and dates that his vascular surgeon at [facility] wants to "know if we can use the same medicine" on the BKA stump. Patient's bowel did contact the office, they do not have Santyl at home as originally thought. We will initiate Santyl to both wounds today. He states he does "the best I can" as far as repositioning but has inconsistent offloading while sleeping. He does use the chair cushion while in dialysis. Continue with follow-up in 2 weeks per their preference 12/22/17-he is here in follow-up evaluation for a sacral ulcer and right BKA stump ulcer. He was hospitalized at [facility] from 2/17-2/25 for sepsis due to right groin abscess, but her surgical consult note this is hidradenitis and required no surgical intervention. He was treated  on the inpatient side with daptomycin. He was discharged on  doxycycline which he has completed. he presents today with a healed sacral ulcer. He has a follow-up appointment at [facility] in a couple of weeks regarding the right BKA stump. He will follow-up in 2 weeks per his preference. 01/05/18-He is here in follow up evaluation for right BKA stump ulcer. There is improvement of this ulcer and we will continue with Santyl. He has seen vascular medicine at [facility] regarding this with no changes in treatment plan. He does have a history of hidradenitis and is following Dr. Otho Ket at [facility] dermatology which he saw Monday. He notes increased pain but no change in drainage to his groin. We will culture this today and hold off on antibiotic therapy until sensitivities resulted; he see Dr Otho Ket in three weeks 01/19/18-He is here in follow-up evaluation for right BKA stump. There is improvement, we will change to Prisma. The culture that was obtained from his right groin last week grew Enterococcus faecalis; Monday he was started on Augmentin. He states he is tolerating the Augmentin with no adverse GI effects. He has no appointment later this month with dermatology. He will follow-up here in 2 weeks 02/02/18-He is here in preparation for right BKA stump ulcer. There is minimal improvement but we will continue with Prisma. He does have a follow-up appointment with vascular medicine this afternoon. His appointment with dermatology as in June not later this month as he originally thought. He does note improvement in drainage from his right groin/hidradenitis. He will follow-up in 2 weeks per his preference. 02/08/18; he was brought in earlier today to see [place] with regards to concerns raised by his wife about purulent drainage coming out of the stump area around the wound that she noted on Sunday for the first time. He is not complaining of pain. There are Vent, Mars B. ([id number]) no systemic symptoms. He was on Augmentin earlier this week for Enterococcus faecalis that was  cultured from his right groin. Purulent drainage also noted by our intake nurse when the dressing was changed 02/16/18 He is here for follow up evaluation of right BKA stump. He had to be seen last week for increased erythema, edema and pain. Culture was obtained and he was initiated on doxycycline; culture grew oxacillin sensitive staph aureus. He will complete his doxycycline tomorrow morning. He is voicing no complaints or concerns. We will treat with collagen to the base and silvercel to fill. He will follow up in two weeks. 03/02/18-He is here in follow-up evaluation for right BKA stump ulcer. There is improvement in both measurements and appearance. He states that last week he went to his primary care and was initiated on oral antibiotics for left foot and toe cellulitis; on exam today he has no edema, minimal erythema to the distal aspect of his toes, which could be vascular in nature. He does have dry skin noted to the left first second and third toes; there is an area of questionable open tissue or recently healed open area to the left great toe we will apply silvercel and monitor. He denies any drainage to this area is no evidence of drainage on today's exam we will air on the side of caution given his significant history. 03/16/18-He is here in follow-up medication for right BKA stump ulcer. There continues to be improvement although there is surrounding induration, no erythema, no fluctuance, no thermal variation, no pain. The ulcer is too shallow for packing, we will try antibiotic  ointment. There is persistent erythema and slight edema to the left toes, essentially unchanged, with no open area. He has an appointment with vascular medicine on/about 6/18 Electronic Signature(s) Signed: 03/16/2018 11:53:47 AM By: Lawanda Cousins Entered By: Lawanda Cousins on 03/16/2018 11:53:46 Vanepps, Toribio Harbour  ([id number]) -------------------------------------------------------------------------------- Physician Orders Details Patient Name: Hensley, Edith B. Date of Service: 03/16/2018 10:00 AM Medical Record Number: [id number] Patient Account Number: [id number] Date of Birth/Sex: Oct 19, 1960 (57 y.o. M) Treating RN: [name] Primary Care Provider: [name] Other Clinician: Referring Provider: [name] Treating Provider/Extender: Cathie Olden in Treatment: 64 Verbal / Phone Orders: Yes Clinician: Carolyne Fiscal, Debi Read Back and Verified: Yes Diagnosis Coding Wound Cleansing Wound #2 Right Amputation Site - Below Knee o Clean wound with Normal Saline. Anesthetic (add to Medication List) Wound #2 Right Amputation Site - Below Knee o Topical Lidocaine 4% cream applied to wound bed prior to debridement (In Clinic Only). Skin Barriers/Peri-Wound Care Wound #2 Right Amputation Site - Below Knee o Skin Prep Primary Wound Dressing Wound #2 Right Amputation Site - Below Knee o Other: - antibiotic ointment Secondary Dressing Wound #2 Right Amputation Site - Below Knee o Boardered Foam Dressing Dressing Change Frequency Wound #2 Right Amputation Site - Below Knee o Change dressing every day. Follow-up Appointments Wound #2 Right Amputation Site - Below Knee o Return Appointment in 1 week. Patient Medications Allergies: adhesive, cefepime, methotrexate, silicon, vancomycin Notifications Medication Indication Start End lidocaine DOSE 1 - topical 4 % cream - 1 cream topical Electronic Signature(s) Signed: 03/17/2018 6:54:27 AM By: Lawanda Cousins Signed: 03/17/2018 5:38:20 PM By: Keenan Bachelor, Toribio Harbour ([id number]) Entered By: Alric Quan on 03/16/2018 11:03:02 Chestang, Toribio Harbour ([id number]) -------------------------------------------------------------------------------- Prescription 03/16/2018 Patient Name: Pelaez, Jondavid B. Provider:  Lawanda Cousins NP Date of Birth: 11-Dec-1960 NPI#: [id number] Sex: Jerilynn Mages DEA#: [id number] Phone #: [phone] License #: Patient Address: [facility] [place], [place] [address] [address], [place], [place] [address] [phone] Allergies adhesive cefepime methotrexate silicon vancomycin Medication Medication: Route: Strength: Form: lidocaine 4 % topical cream topical 4% cream Class: TOPICAL LOCAL ANESTHETICS Dose: Frequency / Time: Indication: 1 1 cream topical Number of Refills: Number of Units: 0 Generic Substitution: Start Date: End Date: One Time Use: Substitution Permitted No Note to Pharmacy: Signature(s): Date(s): Gillingham, WILMAN TUCKER ([id number]) Electronic Signature(s) Signed: 03/17/2018 6:54:27 AM By: Lawanda Cousins Signed: 03/17/2018 5:38:20 PM By: Alric Quan Entered By: Alric Quan on 03/16/2018 11:03:03 Maclaren, Eward B. ([id number]) --------------------------------------------------------------------------------  Problem List Details Patient Name: Mccall, Tiquan B. Date of Service: 03/16/2018 10:00 AM Medical Record Number: [id number] Patient Account Number: [id number] Date of Birth/Sex: Mar 29, 1961 (57 y.o. M) Treating RN: [name] Primary Care Provider: [name] Other Clinician: Referring Provider: [name] Treating Provider/Extender: Cathie Olden in Treatment: 23 Active Problems ICD-10 Impacting Encounter Code Description Active Date Wound Healing Diagnosis S81.801S Unspecified open wound, right lower leg, sequela 11/24/2017 No Yes Z89.511 Acquired absence of right leg below knee 10/06/2017 No Yes N18.6 End stage renal disease 10/06/2017 No Yes E11.622 Type 2 diabetes mellitus with other skin ulcer 10/06/2017 No Yes T81.31XS Disruption of external operation (surgical) wound, not 11/24/2017 No Yes elsewhere classified,  sequela L73.2 Hidradenitis suppurativa 01/05/2018 No Yes Inactive Problems Resolved Problems ICD-10 Code Description Active Date Resolved Date L89.153 Pressure ulcer of sacral region, stage 3 10/06/2017 12/22/2017 Electronic Signature(s) Signed: 03/16/2018 11:49:14 AM By: Lawanda Cousins Entered By: Lawanda Cousins on 03/16/2018  Edmunds, Libero B. ([id number]) -------------------------------------------------------------------------------- Progress Note Details Patient Name: Petter, Nayshawn B. Date of Service: 03/16/2018 10:00 AM Medical Record Number: [id number] Patient Account Number: [id number] Date of Birth/Sex: November 17, 1960 (57 y.o. M) Treating RN: [name] Primary Care Provider: [name] Other Clinician: Referring Provider: [name] Treating Provider/Extender: Cathie Olden in Treatment: 24 Subjective Chief Complaint Information obtained from Patient He is here for evaluation of right bka stump ulcer History of Present Illness (HPI) The following HPI elements were documented for the patient's wound: Location: Patient has a ulcer to Sacrum Quality: denies pain Severity: denies pain Duration: months, but unable to clearly articulate timeframe Context: ulcer occurred secondary to pressure 10-06-17 he is here for initial evaluation of the sacral ulcer. He is unable to articulate exactly this is a states "months". 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 He is currently in a facility, [facility]. He is unsure if he has a pressure reduction mattress. He admits to sitting the majority of the day and sleeping on his back. We discussed the need for lateral sleeping, which he states he is able to do. He voices no complaints of pain, discomfort, fever, chills, ill feeling. He tolerated debridement. 10/26/17 on evaluation today patient sacral wound appears to be doing excellent. He has been tolerating the dressing  changes without complication. He does have some discomfort especially when he has to sit in the chair at dialysis for an extended period of time. With that being said other than that he tries to stay offloaded as much as possible and I think he has done a very good job in that regard. There does not appear to be any new pressure injury. 11/10/17 He is here in follow up evaluation of a sacral ulcer. He is now home, have been using prisma. He presents with essentially unchanged measurements and increased in nonviable tissue. Prior to prisma they were using santyl, will transition back to santyl (spouse to call clinic if they do not have santyl at home). We discussed more aggressive offloading as he continues to sleep on his back, improved diet as he tends to eat "junk food". He does use a pressure redistribution cushion while in dialysis. Follow up in two weeks per their preference. 11/24/17-he is here in follow-up evaluation for a sacral ulcer and dates that his vascular surgeon at [facility] wants to "know if we can use the same medicine" on the BKA stump. Patient's bowel did contact the office, they do not have Santyl at home as originally thought. We will initiate Santyl to both wounds today. He states he does "the best I can" as far as repositioning but has inconsistent offloading while sleeping. He does use the chair cushion while in dialysis. Continue with follow-up in 2 weeks per their preference 12/22/17-he is here in follow-up evaluation for a sacral ulcer and right BKA stump ulcer. He was hospitalized at [facility] from 2/17-2/25 for sepsis due to right groin abscess, but her surgical consult note this is hidradenitis and required no surgical intervention. He was treated on the inpatient side with daptomycin. He was discharged on doxycycline which he has completed. he presents today with a healed sacral ulcer. He has a follow-up appointment at [facility] in a couple of weeks regarding the right BKA  stump. He will follow-up in 2 weeks per his preference. 01/05/18-He is here in follow up evaluation for right BKA stump ulcer. There is improvement  of this ulcer and we will continue with Santyl. He has seen vascular medicine at [facility] regarding this with no changes in treatment plan. He does have a history of hidradenitis and is following Dr. Otho Ket at [facility] dermatology which he saw Monday. He notes increased pain but no change in drainage to his groin. We will culture this today and hold off on antibiotic therapy until sensitivities resulted; he see Dr Otho Ket in three weeks 01/19/18-He is here in follow-up evaluation for right BKA stump. There is improvement, we will change to Prisma. The culture that was obtained from his right groin last week grew Enterococcus faecalis; Monday he was started on Augmentin. He states Corning, Khoa B. ([id number]) he is tolerating the Augmentin with no adverse GI effects. He has no appointment later this month with dermatology. He will follow-up here in 2 weeks 02/02/18-He is here in preparation for right BKA stump ulcer. There is minimal improvement but we will continue with Prisma. He does have a follow-up appointment with vascular medicine this afternoon. His appointment with dermatology as in June not later this month as he originally thought. He does note improvement in drainage from his right groin/hidradenitis. He will follow-up in 2 weeks per his preference. 02/08/18; he was brought in earlier today to see [place] with regards to concerns raised by his wife about purulent drainage coming out of the stump area around the wound that she noted on Sunday for the first time. He is not complaining of pain. There are no systemic symptoms. He was on Augmentin earlier this week for Enterococcus faecalis that was cultured from his right groin. Purulent drainage also noted by our intake nurse when the dressing was changed 02/16/18 He is here for follow up evaluation of right BKA  stump. He had to be seen last week for increased erythema, edema and pain. Culture was obtained and he was initiated on doxycycline; culture grew oxacillin sensitive staph aureus. He will complete his doxycycline tomorrow morning. He is voicing no complaints or concerns. We will treat with collagen to the base and silvercel to fill. He will follow up in two weeks. 03/02/18-He is here in follow-up evaluation for right BKA stump ulcer. There is improvement in both measurements and appearance. He states that last week he went to his primary care and was initiated on oral antibiotics for left foot and toe cellulitis; on exam today he has no edema, minimal erythema to the distal aspect of his toes, which could be vascular in nature. He does have dry skin noted to the left first second and third toes; there is an area of questionable open tissue or recently healed open area to the left great toe we will apply silvercel and monitor. He denies any drainage to this area is no evidence of drainage on today's exam we will air on the side of caution given his significant history. 03/16/18-He is here in follow-up medication for right BKA stump ulcer. There continues to be improvement although there is surrounding induration, no erythema, no fluctuance, no thermal variation, no pain. The ulcer is too shallow for packing, we will try antibiotic ointment. There is persistent erythema and slight edema to the left toes, essentially unchanged, with no open area. He has an appointment with vascular medicine on/about 6/18 Patient History Information obtained from Patient. Family History Diabetes - Mother,Siblings, No family history of Cancer, Heart Disease, Hereditary Spherocytosis, Hypertension, Kidney Disease, Lung Disease, Seizures, Stroke, Thyroid Problems, Tuberculosis. Social History Former smoker - quit 20+ years  ago, Marital Status - Married, Alcohol Use - Never, Drug Use - No History, Caffeine Use  - Daily. Medical And Surgical History Notes Genitourinary renal disorder - CKD Neurologic CVA - 3 months ago Objective Constitutional Vitals Time Taken: 10:30 AM, Height: 71 in, Weight: 178 lbs, BMI: 24.8, Temperature: 98.4 F, Pulse: 62 bpm, Respiratory Rate: 18 breaths/min, Blood Pressure: 158/80 mmHg. Michelotti, Demontay B. ([id number]) Integumentary (Hair, Skin) Wound #2 status is Open. Original cause of wound was Surgical Injury. The wound is located on the Right Amputation Site - Below Knee. The wound measures 0.2cm length x 0.2cm width x 0.3cm depth; 0.031cm^2 area and 0.009cm^3 volume. There is Fat Layer (Subcutaneous Tissue) Exposed exposed. There is no tunneling or undermining noted. There is a small amount of serous drainage noted. The wound margin is flat and intact. There is large (67-100%) red granulation within the wound bed. There is a small (1-33%) amount of necrotic tissue within the wound bed including Adherent Slough. The periwound skin appearance exhibited: Excoriation. The periwound skin appearance did not exhibit: Callus, Crepitus, Induration, Rash, Scarring, Dry/Scaly, Maceration, Atrophie Blanche, Cyanosis, Ecchymosis, Hemosiderin Staining, Mottled, Pallor, Rubor, Erythema. Periwound temperature was noted as No Abnormality. Assessment Active Problems ICD-10 S28.315V - Unspecified open wound, right lower leg, sequela Z89.511 - Acquired absence of right leg below knee N18.6 - End stage renal disease E11.622 - Type 2 diabetes mellitus with other skin ulcer T81.31XS - Disruption of external operation (surgical) wound, not elsewhere classified, sequela L73.2 - Hidradenitis suppurativa Plan Wound Cleansing: Wound #2 Right Amputation Site - Below Knee: Clean wound with Normal Saline. Anesthetic (add to Medication List): Wound #2 Right Amputation Site - Below Knee: Topical Lidocaine 4% cream applied to wound bed prior to debridement (In Clinic Only). Skin  Barriers/Peri-Wound Care: Wound #2 Right Amputation Site - Below Knee: Skin Prep Primary Wound Dressing: Wound #2 Right Amputation Site - Below Knee: Other: - antibiotic ointment Secondary Dressing: Wound #2 Right Amputation Site - Below Knee: Boardered Foam Dressing Dressing Change Frequency: Wound #2 Right Amputation Site - Below Knee: Change dressing every day. Follow-up Appointments: Wound #2 Right Amputation Site - Below Knee: Return Appointment in 1 week. The following medication(s) was prescribed: lidocaine topical 4 % cream 1 1 cream topical was prescribed at facility Tinkey, KAIEN PEZZULLO ([id number]) Electronic Signature(s) Signed: 03/16/2018 11:54:14 AM By: Lawanda Cousins Entered By: Lawanda Cousins on 03/16/2018 11:54:14 Surratt, Toribio Harbour ([id number]) -------------------------------------------------------------------------------- ROS/PFSH Details Patient Name: Geidel, Binyamin B. Date of Service: 03/16/2018 10:00 AM Medical Record Number: [id number] Patient Account Number: [id number] Date of Birth/Sex: 09/29/1961 (57 y.o. M) Treating RN: [name] Primary Care Provider: [name] Other Clinician: Referring Provider: [name] Treating Provider/Extender: Cathie Olden in Treatment: 23 Information Obtained From Patient Wound History Do you currently have one or more open woundso Yes How many open wounds do you currently haveo 1 Approximately how long have you had your woundso about 2 months How have you been treating your wound(s) until nowo santyl Has your wound(s) ever healed and then re-openedo No Have you had any lab work done in the past montho No Have you tested positive for an antibiotic resistant organism (MRSA, VRE)o No Have you tested positive for osteomyelitis (bone infection)o No Have you had any tests for circulation on your legso Yes Who ordered the testo PCP Where was the test doneo [facility] Medical History: Negative for: Cataracts;  Glaucoma; Optic Neuritis Hematologic/Lymphatic Medical History: Negative for: Anemia; Hemophilia; Human Immunodeficiency Virus; Lymphedema; Sickle  Cell Disease Respiratory Medical History: Negative for: Aspiration; Asthma; Chronic Obstructive Pulmonary Disease (COPD); Pneumothorax; Sleep Apnea; Tuberculosis Cardiovascular Medical History: Positive for: Hypertension; Peripheral Arterial Disease Negative for: Angina; Arrhythmia; Congestive Heart Failure; Coronary Artery Disease; Deep Vein Thrombosis; Hypotension; Myocardial Infarction; Peripheral Venous Disease; Phlebitis; Vasculitis Gastrointestinal Medical History: Positive for: Crohnos Negative for: Cirrhosis ; Colitis; Hepatitis A; Hepatitis B; Hepatitis C Endocrine Medical History: Positive for: Type II Diabetes [place], Vint B. ([id number]) Time with diabetes: 20+ years Treated with: Insulin Blood sugar tested every day: Yes Tested : Genitourinary Medical History: Past Medical History Notes: renal disorder - CKD Immunological Medical History: Negative for: Lupus Erythematosus; Raynaudos; Scleroderma Musculoskeletal Medical History: Negative for: Gout; Rheumatoid Arthritis; Osteoarthritis; Osteomyelitis Neurologic Medical History: Negative for: Dementia; Neuropathy; Quadriplegia; Paraplegia; Seizure Disorder Past Medical History Notes: CVA - 3 months ago Oncologic Medical History: Negative for: Received Chemotherapy; Received Radiation Immunizations Pneumococcal Vaccine: Received Pneumococcal Vaccination: Yes Implantable Devices Family and Social History Cancer: No; Diabetes: Yes - Mother,Siblings; Heart Disease: No; Hereditary Spherocytosis: No; Hypertension: No; Kidney Disease: No; Lung Disease: No; Seizures: No; Stroke: No; Thyroid Problems: No; Tuberculosis: No; Former smoker - quit 20+ years ago; Marital Status - Married; Alcohol Use: Never; Drug Use: No History; Caffeine Use: Daily; Financial Concerns: No;  Food, Clothing or Shelter Needs: No; Support System Lacking: No; Transportation Concerns: No; Advanced Directives: No; Patient does not want information on Advanced Directives Physician Affirmation I have reviewed and agree with the above information. Electronic Signature(s) Signed: 03/17/2018 6:54:27 AM By: Lawanda Cousins Signed: 03/17/2018 5:38:20 PM By: Alric Quan Entered By: Lawanda Cousins on 03/16/2018 11:53:57 Tillison, Toribio Harbour ([id number]) -------------------------------------------------------------------------------- SuperBill Details Patient Name: Babiarz, Jullien B. Date of Service: 03/16/2018 Medical Record Number: [id number] Patient Account Number: [id number] Date of Birth/Sex: Feb 02, 1961 (57 y.o. M) Treating RN: [name] Primary Care Provider: [name] Other Clinician: Referring Provider: [name] Treating Provider/Extender: Cathie Olden in Treatment: 23 Diagnosis Coding ICD-10 Codes Code Description S81.801S Unspecified open wound, right lower leg, sequela Z89.511 Acquired absence of right leg below knee N18.6 End stage renal disease E11.622 Type 2 diabetes mellitus with other skin ulcer T81.31XS Disruption of external operation (surgical) wound, not elsewhere classified, sequela L73.2 Hidradenitis suppurativa Facility Procedures CPT4 Code: [id number] Description: 99213 - WOUND CARE VISIT-LEV 3 EST PT Modifier: Quantity: 1 Physician Procedures CPT4 Code: [id number] Description: 99213 - WC PHYS LEVEL 3 - EST PT ICD-10 Diagnosis Description S81.801S Unspecified open wound, right lower leg, sequela Modifier: Quantity: 1 Electronic Signature(s) Signed: 03/16/2018 5:11:52 PM By: Alric Quan Signed: 03/17/2018 6:54:27 AM By: Lawanda Cousins Previous Signature: 03/16/2018 11:54:30 AM Version By: Lawanda Cousins Entered By: Alric Quan on 03/16/2018 17:11:51

## 2018-03-30 ENCOUNTER — Encounter: Payer: BLUE CROSS/BLUE SHIELD | Attending: Nurse Practitioner | Admitting: Nurse Practitioner

## 2018-03-30 DIAGNOSIS — E11622 Type 2 diabetes mellitus with other skin ulcer: Secondary | ICD-10-CM | POA: Diagnosis not present

## 2018-03-30 DIAGNOSIS — Z833 Family history of diabetes mellitus: Secondary | ICD-10-CM | POA: Insufficient documentation

## 2018-03-30 DIAGNOSIS — Z992 Dependence on renal dialysis: Secondary | ICD-10-CM | POA: Diagnosis not present

## 2018-03-30 DIAGNOSIS — Z87891 Personal history of nicotine dependence: Secondary | ICD-10-CM | POA: Diagnosis not present

## 2018-03-30 DIAGNOSIS — Z8673 Personal history of transient ischemic attack (TIA), and cerebral infarction without residual deficits: Secondary | ICD-10-CM | POA: Diagnosis not present

## 2018-03-30 DIAGNOSIS — X58XXXA Exposure to other specified factors, initial encounter: Secondary | ICD-10-CM | POA: Insufficient documentation

## 2018-03-30 DIAGNOSIS — E1122 Type 2 diabetes mellitus with diabetic chronic kidney disease: Secondary | ICD-10-CM | POA: Insufficient documentation

## 2018-03-30 DIAGNOSIS — E1151 Type 2 diabetes mellitus with diabetic peripheral angiopathy without gangrene: Secondary | ICD-10-CM | POA: Insufficient documentation

## 2018-03-30 DIAGNOSIS — T8789 Other complications of amputation stump: Secondary | ICD-10-CM | POA: Diagnosis not present

## 2018-03-30 DIAGNOSIS — L89153 Pressure ulcer of sacral region, stage 3: Secondary | ICD-10-CM | POA: Insufficient documentation

## 2018-03-30 DIAGNOSIS — L97819 Non-pressure chronic ulcer of other part of right lower leg with unspecified severity: Secondary | ICD-10-CM | POA: Diagnosis not present

## 2018-03-30 DIAGNOSIS — I120 Hypertensive chronic kidney disease with stage 5 chronic kidney disease or end stage renal disease: Secondary | ICD-10-CM | POA: Diagnosis not present

## 2018-03-30 DIAGNOSIS — N186 End stage renal disease: Secondary | ICD-10-CM | POA: Diagnosis not present

## 2018-04-02 NOTE — Progress Notes (Signed)
LEJON, AFZAL ([id number]) Visit Report for 03/30/2018 Chief Complaint Document Details Patient Name: Brady, [name] Date of Service: 03/30/2018 9:30 AM Medical Record Number: [id number] Patient Account Number: [id number] Date of Birth/Sex: November 08, 1960 (57 y.o. M) Treating RN: [name] Primary Care Provider: Lelon Brady Other Clinician: Referring Provider: Lelon Brady Treating Provider/Extender: [name] in Treatment: 25 Information Obtained from: Patient Chief Complaint He is here for evaluation of right bka stump ulcer Electronic Signature(s) Signed: 03/30/2018 11:08:19 AM By: [name] Entered By: [name] on 03/30/2018 11:08:18 [name] ([id number]) -------------------------------------------------------------------------------- HPI Details Patient Name: Brady, [name] Date of Service: 03/30/2018 9:30 AM Medical Record Number: [id number] Patient Account Number: [id number] Date of Birth/Sex: 18-May-1961 (57 y.o. M) Treating RN: [name] Primary Care Provider: Lelon Brady Other Clinician: Referring Provider: Lelon Brady Treating Provider/Extender: [name] in Treatment: 25 History of Present Illness Location: Patient has a ulcer to Sacrum Quality: denies pain Severity: denies pain Duration: months, but unable to clearly articulate timeframe Context: ulcer occurred secondary to pressure HPI Description: 10-06-17 he is here for initial evaluation of the sacral ulcer. He is unable to articulate exactly this is a states "months". 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 He is currently in a facility, [facility]. He is unsure if he has a pressure reduction mattress. He admits to sitting the majority of the day and sleeping on his back. We discussed the need for lateral sleeping, which he states he is able to do. He voices no complaints of pain, discomfort, fever,  chills, ill feeling. He tolerated debridement. 10/26/17 on evaluation today patient sacral wound appears to be doing excellent. He has been tolerating the dressing changes without complication. He does have some discomfort especially when he has to sit in the chair at dialysis for an extended period of time. With that being said other than that he tries to stay offloaded as much as possible and I think he has done a very good job in that regard. There does not appear to be any new pressure injury. 11/10/17 He is here in follow up evaluation of a sacral ulcer. He is now home, have been using prisma. He presents with essentially unchanged measurements and increased in nonviable tissue. Prior to prisma they were using santyl, will transition back to santyl (spouse to call clinic if they do not have santyl at home). We discussed more aggressive offloading as he continues to sleep on his back, improved diet as he tends to eat "junk food". He does use a pressure redistribution cushion while in dialysis. Follow up in two weeks per their preference. 11/24/17-he is here in follow-up evaluation for a sacral ulcer and dates that his vascular surgeon at [facility] wants to "know if we can use the same medicine" on the BKA stump. Patient's bowel did contact the office, they do not have Santyl at home as originally thought. We will initiate Santyl to both wounds today. He states he does "the best I can" as far as repositioning but has inconsistent offloading while sleeping. He does use the chair cushion while in dialysis. Continue with follow-up in 2 weeks per their preference 12/22/17-he is here in follow-up evaluation for a sacral ulcer and right BKA stump ulcer. He was hospitalized at [facility] from 2/17-2/25 for sepsis due to right groin abscess, but her surgical consult note this is hidradenitis and required no surgical intervention. He was treated  on the inpatient side with daptomycin. He was discharged on  doxycycline which he has completed. he presents today with a healed sacral ulcer. He has a follow-up appointment at [facility] in a couple of weeks regarding the right BKA stump. He will follow-up in 2 weeks per his preference. 01/05/18-He is here in follow up evaluation for right BKA stump ulcer. There is improvement of this ulcer and we will continue with Santyl. He has seen vascular medicine at [facility] regarding this with no changes in treatment plan. He does have a history of hidradenitis and is following Dr. Otho Brady at [facility] dermatology which he saw Monday. He notes increased pain but no change in drainage to his groin. We will culture this today and hold off on antibiotic therapy until sensitivities resulted; he see Dr [name] in three weeks 01/19/18-He is here in follow-up evaluation for right BKA stump. There is improvement, we will change to Prisma. The culture that was obtained from his right groin last week grew Enterococcus faecalis; Monday he was started on Augmentin. He states he is tolerating the Augmentin with no adverse GI effects. He has no appointment later this month with dermatology. He will follow-up here in 2 weeks 02/02/18-He is here in preparation for right BKA stump ulcer. There is minimal improvement but we will continue with Prisma. He does have a follow-up appointment with vascular medicine this afternoon. His appointment with dermatology as in June not later this month as he originally thought. He does note improvement in drainage from his right groin/hidradenitis. He will follow-up in 2 weeks per his preference. 02/08/18; he was brought in earlier today to see [place] with regards to concerns raised by his wife about purulent drainage coming out of the stump area around the wound that she noted on Sunday for the first time. He is not complaining of pain. There are [name], [name] ([id number]) no systemic symptoms. He was on Augmentin earlier this week for Enterococcus faecalis that was  cultured from his right groin. Purulent drainage also noted by our intake nurse when the dressing was changed 02/16/18 He is here for follow up evaluation of right BKA stump. He had to be seen last week for increased erythema, edema and pain. Culture was obtained and he was initiated on doxycycline; culture grew oxacillin sensitive staph aureus. He will complete his doxycycline tomorrow morning. He is voicing no complaints or concerns. We will treat with collagen to the base and silvercel to fill. He will follow up in two weeks. 03/02/18-He is here in follow-up evaluation for right BKA stump ulcer. There is improvement in both measurements and appearance. He states that last week he went to his primary care and was initiated on oral antibiotics for left foot and toe cellulitis; on exam today he has no edema, minimal erythema to the distal aspect of his toes, which could be vascular in nature. He does have dry skin noted to the left first second and third toes; there is an area of questionable open tissue or recently healed open area to the left great toe we will apply silvercel and monitor. He denies any drainage to this area is no evidence of drainage on today's exam we will air on the side of caution given his significant history. 03/16/18-He is here in follow-up medication for right BKA stump ulcer. There continues to be improvement although there is surrounding induration, no erythema, no fluctuance, no thermal variation, no pain. The ulcer is too shallow for packing, we will try antibiotic  ointment. There is persistent erythema and slight edema to the left toes, essentially unchanged, with no open area. He has an appointment with vascular medicine on/about 6/18 03/30/18-He is here for evaluation for right BKA stump ulcer. This appears epithelialized with no evidence of drainage over the past 2 weeks. We will continue with antibiotic ointment for 1 additional week. He follows with vascular medicine  on 6/18. The left foot and toes continue to have erythema, cool to touch. There are areas to left great toe that were not present at his last evaluation; appears like injury from removal of dry skin. He was advised to contact his vascular surgeon due to change in color and temperature over the last 2 weeks. He will be discharged from wound care services at this time Electronic Signature(s) Signed: 03/30/2018 11:09:54 AM By: [name] Entered By: [name] on 03/30/2018 11:09:53 Guinn, [name] ([id number]) -------------------------------------------------------------------------------- Physician Orders Details Patient Name: Brady, [name] Date of Service: 03/30/2018 9:30 AM Medical Record Number: [id number] Patient Account Number: [id number] Date of Birth/Sex: 10-11-1961 (57 y.o. M) Treating RN: [name] Primary Care Provider: Lelon Brady Other Clinician: Referring Provider: Lelon Brady Treating Provider/Extender: [name] in Treatment: 25 Verbal / Phone Orders: Yes Clinician: Carolyne Fiscal, Debi Read Back and Verified: Yes Diagnosis Coding Discharge From [facility] Services o Discharge from [place] area clean and dry. Place antibiotic ointment on area for a week. Please call our office if you have any questions or concerns. Electronic Signature(s) Signed: 03/30/2018 3:04:13 PM By: Alric Quan Signed: 03/30/2018 3:35:38 PM By: [name] Entered By: Alric Quan on 03/30/2018 10:08:01 Mccullars, [name] ([id number]) -------------------------------------------------------------------------------- Problem List Details Patient Name: Feig, Leray B. Date of Service: 03/30/2018 9:30 AM Medical Record Number: [id number] Patient Account Number: [id number] Date of Birth/Sex: 1961/04/21 (57 y.o. M) Treating RN: [name] Primary Care Provider: Lelon Brady Other Clinician: Referring Provider: Lelon Brady Treating  Provider/Extender: [name] in Treatment: 25 Active Problems ICD-10 Impacting Encounter Code Description Active Date Wound Healing Diagnosis S81.801S Unspecified open wound, right lower leg, sequela 11/24/2017 No Yes Z89.511 Acquired absence of right leg below knee 10/06/2017 No Yes N18.6 End stage renal disease 10/06/2017 No Yes E11.622 Type 2 diabetes mellitus with other skin ulcer 10/06/2017 No Yes T81.31XS Disruption of external operation (surgical) wound, not 11/24/2017 No Yes elsewhere classified, sequela L73.2 Hidradenitis suppurativa 01/05/2018 No Yes Inactive Problems Resolved Problems ICD-10 Code Description Active Date Resolved Date L89.153 Pressure ulcer of sacral region, stage 3 10/06/2017 12/22/2017 Electronic Signature(s) Signed: 03/30/2018 10:21:15 AM By: [name] Entered By: [name] on 03/30/2018 10:21:14 Coba, [name] ([id number]) -------------------------------------------------------------------------------- Progress Note Details Patient Name: Brady, [name] Date of Service: 03/30/2018 9:30 AM Medical Record Number: [id number] Patient Account Number: [id number] Date of Birth/Sex: [birth year] (57 y.o. M) Treating RN: [name] Primary Care Provider: Lelon Brady Other Clinician: Referring Provider: Lelon Brady Treating Provider/Extender: [name] in Treatment: 25 Subjective Chief Complaint Information obtained from Patient He is here for evaluation of right bka stump ulcer History of Present Illness (HPI) The following HPI elements were documented for the patient's wound: Location: Patient has a ulcer to Sacrum Quality: denies pain Severity: denies pain Duration: months, but unable to clearly articulate timeframe Context: ulcer occurred secondary to pressure 10-06-17 he is here for initial evaluation of the sacral ulcer. He is unable to articulate exactly this is a states "months". He had a right BKA in October and  is unaware if the  pressure injury was there at that time. He is currently in a facility, [facility]. He is unsure if he has a pressure reduction mattress. He admits to sitting the majority of the day and sleeping on his back. We discussed the need for lateral sleeping, which he states he is able to do. He voices no complaints of pain, discomfort, fever, chills, ill feeling. He tolerated debridement. 10/26/17 on evaluation today patient sacral wound appears to be doing excellent. He has been tolerating the dressing changes without complication. He does have some discomfort especially when he has to sit in the chair at dialysis for an extended period of time. With that being said other than that he tries to stay offloaded as much as possible and I think he has done a very good job in that regard. There does not appear to be any new pressure injury. 11/10/17 He is here in follow up evaluation of a sacral ulcer. He is now home, have been using prisma. He presents with essentially unchanged measurements and increased in nonviable tissue. Prior to prisma they were using santyl, will transition back to santyl (spouse to call clinic if they do not have santyl at home). We discussed more aggressive offloading as he continues to sleep on his back, improved diet as he tends to eat "junk food". He does use a pressure redistribution cushion while in dialysis. Follow up in two weeks per their preference. 11/24/17-he is here in follow-up evaluation for a sacral ulcer and dates that his vascular surgeon at [facility] wants to "know if we can use the same medicine" on the BKA stump. Patient's bowel did contact the office, they do not have Santyl at home as originally thought. We will initiate Santyl to both wounds today. He states he does "the best I can" as far as repositioning but has inconsistent offloading while sleeping. He does use the chair cushion while in dialysis. Continue with follow-up in 2 weeks per their  preference 12/22/17-he is here in follow-up evaluation for a sacral ulcer and right BKA stump ulcer. He was hospitalized at [facility] from 2/17-2/25 for sepsis due to right groin abscess, but her surgical consult note this is hidradenitis and required no surgical intervention. He was treated on the inpatient side with daptomycin. He was discharged on doxycycline which he has completed. he presents today with a healed sacral ulcer. He has a follow-up appointment at [facility] in a couple of weeks regarding the right BKA stump. He will follow-up in 2 weeks per his preference. 01/05/18-He is here in follow up evaluation for right BKA stump ulcer. There is improvement of this ulcer and we will continue with Santyl. He has seen vascular medicine at [facility] regarding this with no changes in treatment plan. He does have a history of hidradenitis and is following Dr. Otho Brady at [facility] dermatology which he saw Monday. He notes increased pain but no change in drainage to his groin. We will culture this today and hold off on antibiotic therapy until sensitivities resulted; he see Dr [name] in three weeks 01/19/18-He is here in follow-up evaluation for right BKA stump. There is improvement, we will change to Prisma. The culture that was obtained from his right groin last week grew Enterococcus faecalis; Monday he was started on Augmentin. He states Horrigan, Jarmar B. ([id number]) he is tolerating the Augmentin with no adverse GI effects. He has no appointment later this month with dermatology. He will follow-up here in 2 weeks 02/02/18-He is here in preparation for  right BKA stump ulcer. There is minimal improvement but we will continue with Prisma. He does have a follow-up appointment with vascular medicine this afternoon. His appointment with dermatology as in June not later this month as he originally thought. He does note improvement in drainage from his right groin/hidradenitis. He will follow-up in 2 weeks per his  preference. 02/08/18; he was brought in earlier today to see [place] with regards to concerns raised by his wife about purulent drainage coming out of the stump area around the wound that she noted on Sunday for the first time. He is not complaining of pain. There are no systemic symptoms. He was on Augmentin earlier this week for Enterococcus faecalis that was cultured from his right groin. Purulent drainage also noted by our intake nurse when the dressing was changed 02/16/18 He is here for follow up evaluation of right BKA stump. He had to be seen last week for increased erythema, edema and pain. Culture was obtained and he was initiated on doxycycline; culture grew oxacillin sensitive staph aureus. He will complete his doxycycline tomorrow morning. He is voicing no complaints or concerns. We will treat with collagen to the base and silvercel to fill. He will follow up in two weeks. 03/02/18-He is here in follow-up evaluation for right BKA stump ulcer. There is improvement in both measurements and appearance. He states that last week he went to his primary care and was initiated on oral antibiotics for left foot and toe cellulitis; on exam today he has no edema, minimal erythema to the distal aspect of his toes, which could be vascular in nature. He does have dry skin noted to the left first second and third toes; there is an area of questionable open tissue or recently healed open area to the left great toe we will apply silvercel and monitor. He denies any drainage to this area is no evidence of drainage on today's exam we will air on the side of caution given his significant history. 03/16/18-He is here in follow-up medication for right BKA stump ulcer. There continues to be improvement although there is surrounding induration, no erythema, no fluctuance, no thermal variation, no pain. The ulcer is too shallow for packing, we will try antibiotic ointment. There is persistent erythema and slight edema to  the left toes, essentially unchanged, with no open area. He has an appointment with vascular medicine on/about 6/18 03/30/18-He is here for evaluation for right BKA stump ulcer. This appears epithelialized with no evidence of drainage over the past 2 weeks. We will continue with antibiotic ointment for 1 additional week. He follows with vascular medicine on 6/18. The left foot and toes continue to have erythema, cool to touch. There are areas to left great toe that were not present at his last evaluation; appears like injury from removal of dry skin. He was advised to contact his vascular surgeon due to change in color and temperature over the last 2 weeks. He will be discharged from wound care services at this time Wound History Patient reportedly has not tested positive for osteomyelitis. Patient reportedly has had testing performed to evaluate circulation in the legs. Patient History Information obtained from Patient. Family History Diabetes - Mother,Siblings, No family history of Cancer, Heart Disease, Hereditary Spherocytosis, Hypertension, Kidney Disease, Lung Disease, Seizures, Stroke, Thyroid Problems, Tuberculosis. Social History Former smoker - quit 20+ years ago, Marital Status - Married, Alcohol Use - Never, Drug Use - No History, Caffeine Use - Daily. Medical And Surgical History Notes  Genitourinary renal disorder - CKD Neurologic CVA - 3 months ago Brady, [name] ([id number]) Objective Constitutional Vitals Time Taken: 9:47 AM, Height: 71 in, Weight: 178 lbs, BMI: 24.8, Temperature: 98.3 F, Pulse: 56 bpm, Respiratory Rate: 16 breaths/min, Blood Pressure: 131/72 mmHg. Integumentary (Hair, Skin) Wound #2 status is Healed - Epithelialized. Original cause of wound was Surgical Injury. The wound is located on the Right Amputation Site - Below Knee. The wound measures 0cm length x 0cm width x 0cm depth; 0cm^2 area and 0cm^3 volume. There is a none present amount of drainage  noted. The wound margin is flat and intact. There is no granulation within the wound bed. There is a small (1-33%) amount of necrotic tissue within the wound bed including Adherent Slough. The periwound skin appearance exhibited: Scarring. The periwound skin appearance did not exhibit: Callus, Crepitus, Excoriation, Induration, Rash, Dry/Scaly, Maceration, Atrophie Blanche, Cyanosis, Ecchymosis, Hemosiderin Staining, Mottled, Pallor, Rubor, Erythema. Periwound temperature was noted as No Abnormality. Assessment Active Problems ICD-10 Unspecified open wound, right lower leg, sequela Acquired absence of right leg below knee End stage renal disease Type 2 diabetes mellitus with other skin ulcer Disruption of external operation (surgical) wound, not elsewhere classified, sequela Hidradenitis suppurativa Plan Discharge From [facility] Services: Discharge from [place] area clean and dry. Place antibiotic ointment on area for a week. Please call our office if you have any questions or concerns. Electronic Signature(s) Signed: 03/30/2018 11:12:19 AM By: [name] Entered By: [name] on 03/30/2018 11:12:19 Vecchiarelli, [name] ([id number]) -------------------------------------------------------------------------------- ROS/PFSH Details Patient Name: Brady, [name] Date of Service: 03/30/2018 9:30 AM Medical Record Number: [id number] Patient Account Number: [id number] Date of Birth/Sex: 02-27-1961 (57 y.o. M) Treating RN: [name] Primary Care Provider: Lelon Brady Other Clinician: Referring Provider: Lelon Brady Treating Provider/Extender: [name] in Treatment: 25 Information Obtained From Patient Wound History Do you currently have one or more open woundso No Have you tested positive for osteomyelitis (bone infection)o No Have you had any tests for circulation on your legso Yes Who ordered the testo PCP Where was the test doneo  [facility] Medical History: Negative for: Cataracts; Glaucoma; Optic Neuritis Hematologic/Lymphatic Medical History: Negative for: Anemia; Hemophilia; Human Immunodeficiency Virus; Lymphedema; Sickle Cell Disease Respiratory Medical History: Negative for: Aspiration; Asthma; Chronic Obstructive Pulmonary Disease (COPD); Pneumothorax; Sleep Apnea; Tuberculosis Cardiovascular Medical History: Positive for: Hypertension; Peripheral Arterial Disease Negative for: Angina; Arrhythmia; Congestive Heart Failure; Coronary Artery Disease; Deep Vein Thrombosis; Hypotension; Myocardial Infarction; Peripheral Venous Disease; Phlebitis; Vasculitis Gastrointestinal Medical History: Positive for: Crohnos Negative for: Cirrhosis ; Colitis; Hepatitis A; Hepatitis B; Hepatitis C Endocrine Medical History: Positive for: Type II Diabetes Time with diabetes: 20+ years Treated with: Insulin Blood sugar tested every day: Yes Tested : Genitourinary Dingus, [name] ([id number]) Medical History: Past Medical History Notes: renal disorder - CKD Immunological Medical History: Negative for: Lupus Erythematosus; Raynaudos; Scleroderma Musculoskeletal Medical History: Negative for: Gout; Rheumatoid Arthritis; Osteoarthritis; Osteomyelitis Neurologic Medical History: Negative for: Dementia; Neuropathy; Quadriplegia; Paraplegia; Seizure Disorder Past Medical History Notes: CVA - 3 months ago Oncologic Medical History: Negative for: Received Chemotherapy; Received Radiation Immunizations Pneumococcal Vaccine: Received Pneumococcal Vaccination: Yes Implantable Devices Family and Social History Cancer: No; Diabetes: Yes - Mother,Siblings; Heart Disease: No; Hereditary Spherocytosis: No; Hypertension: No; Kidney Disease: No; Lung Disease: No; Seizures: No; Stroke: No; Thyroid Problems: No; Tuberculosis: No; Former smoker - quit 20+ years ago; Marital Status - Married; Alcohol Use: Never; Drug Use: No  History; Caffeine Use:  Daily; Financial Concerns: No; Food, Clothing or Shelter Needs: No; Support System Lacking: No; Transportation Concerns: No; Advanced Directives: No; Patient does not want information on Advanced Directives Physician Affirmation I have reviewed and agree with the above information. Electronic Signature(s) Signed: 03/30/2018 3:04:13 PM By: Alric Quan Signed: 03/30/2018 3:35:38 PM By: [name] Entered By: [name] on 03/30/2018 11:10:16 Kutzer, [name] ([id number]) -------------------------------------------------------------------------------- SuperBill Details Patient Name: Brady, [name] Date of Service: 03/30/2018 Medical Record Number: [id number] Patient Account Number: [id number] Date of Birth/Sex: 1960-11-13 (57 y.o. M) Treating RN: [name] Primary Care Provider: Lelon Brady Other Clinician: Referring Provider: Lelon Brady Treating Provider/Extender: [name] in Treatment: 25 Diagnosis Coding ICD-10 Codes Code Description S81.801S Unspecified open wound, right lower leg, sequela Z89.511 Acquired absence of right leg below knee N18.6 End stage renal disease E11.622 Type 2 diabetes mellitus with other skin ulcer T81.31XS Disruption of external operation (surgical) wound, not elsewhere classified, sequela L73.2 Hidradenitis suppurativa Facility Procedures CPT4 Code: 17616073 Description: 71062 - WOUND CARE VISIT-LEV 2 EST PT Modifier: Quantity: 1 Physician Procedures CPT4 Code: 6948546 Description: 27035 - WC PHYS LEVEL 2 - EST PT ICD-10 Diagnosis Description S81.801S Unspecified open wound, right lower leg, sequela Modifier: Quantity: 1 Electronic Signature(s) Signed: 03/30/2018 11:27:19 AM By: Alric Quan Signed: 03/30/2018 3:35:38 PM By: [name] Previous Signature: 03/30/2018 11:12:35 AM Version By: [name] Entered By: Alric Quan on 03/30/2018 11:27:18

## 2018-04-02 NOTE — Progress Notes (Signed)
[name] ([id number]) Visit Report for 03/30/2018 Arrival Information Details Patient Name: [name], [name] Date of Service: 03/30/2018 9:30 AM Medical Record Number: [id number] Patient Account Number: [id number] Date of Birth/Sex: 1961/06/09 (57 y.o. M) Treating RN: Secundino Ginger Primary Care Walta [place]: [name] Other Clinician: Referring Elajah Kunsman: [name] Treating Malaki Koury/Extender: Cathie Olden in Treatment: 25 Visit Information History Since Last Visit Added or deleted any medications: No Patient Arrived: Wheel Chair Any new allergies or adverse reactions: No Arrival Time: 09:45 Had a fall or experienced change in No activities of daily living that may affect Accompanied By: spouse risk of falls: Transfer Assistance: None Signs or symptoms of abuse/neglect since last visito No Patient Identification Verified: Yes Hospitalized since last visit: No Secondary Verification Process Completed: Yes Implantable device outside of the clinic excluding No Patient Requires Transmission-Based No cellular tissue based products placed in the center Precautions: since last visit: Patient Has Alerts: Yes Has Dressing in Place as Prescribed: Yes Patient Alerts: DMII Pain Present Now: No Electronic Signature(s) Signed: 03/30/2018 2:00:15 PM By: Secundino Ginger Entered By: Secundino Ginger on 03/30/2018 09:46:28 [name], [name] ([id number]) -------------------------------------------------------------------------------- Clinic Level of Care Assessment Details Patient Name: [name], [name] Date of Service: 03/30/2018 9:30 AM Medical Record Number: [id number] Patient Account Number: [id number] Date of Birth/Sex: [birth year] (57 y.o. M) Treating RN: [name] Primary Care Assia Meanor: [name] Other Clinician: Referring Elzie Knisley: [name] Treating Gee Habig/Extender: Cathie Olden in Treatment: 25 Clinic Level of Care Assessment Items TOOL 4 Quantity Score X  - Use when only an EandM is performed on FOLLOW-UP visit 1 0 ASSESSMENTS - Nursing Assessment / Reassessment X - Reassessment of Co-morbidities (includes updates in patient status) 1 10 X- 1 5 Reassessment of Adherence to Treatment Plan ASSESSMENTS - Wound and Skin Assessment / Reassessment X - Simple Wound Assessment / Reassessment - one wound 1 5 []  - 0 Complex Wound Assessment / Reassessment - multiple wounds []  - 0 Dermatologic / Skin Assessment (not related to wound area) ASSESSMENTS - Focused Assessment []  - Circumferential Edema Measurements - multi extremities 0 []  - 0 Nutritional Assessment / Counseling / Intervention []  - 0 Lower Extremity Assessment (monofilament, tuning fork, pulses) []  - 0 Peripheral Arterial Disease Assessment (using hand held doppler) ASSESSMENTS - Ostomy and/or Continence Assessment and Care []  - Incontinence Assessment and Management 0 []  - 0 Ostomy Care Assessment and Management (repouching, etc.) PROCESS - Coordination of Care X - Simple Patient / Family Education for ongoing care 1 15 []  - 0 Complex (extensive) Patient / Family Education for ongoing care []  - 0 Staff obtains Programmer, systems, Records, Test Results / Process Orders []  - 0 Staff telephones HHA, Nursing Homes / Clarify orders / etc []  - 0 Routine Transfer to another Facility (non-emergent condition) []  - 0 Routine Hospital Admission (non-emergent condition) []  - 0 New Admissions / Biomedical engineer / Ordering NPWT, Apligraf, etc. []  - 0 Emergency Hospital Admission (emergent condition) X- 1 10 Simple Discharge Coordination [name], [name] ([id number]) []  - 0 Complex (extensive) Discharge Coordination PROCESS - Special Needs []  - Pediatric / Minor Patient Management 0 []  - 0 Isolation Patient Management []  - 0 Hearing / Language / Visual special needs []  - 0 Assessment of Community assistance (transportation, D/C planning, etc.) []  - 0 Additional assistance /  Altered mentation []  - 0 Support Surface(s) Assessment (bed, cushion, seat, etc.) INTERVENTIONS - Wound Cleansing / Measurement X - Simple Wound Cleansing - one wound 1 5 []  -  0 Complex Wound Cleansing - multiple wounds X- 1 5 Wound Imaging (photographs - any number of wounds) []  - 0 Wound Tracing (instead of photographs) []  - 0 Simple Wound Measurement - one wound []  - 0 Complex Wound Measurement - multiple wounds INTERVENTIONS - Wound Dressings []  - Small Wound Dressing one or multiple wounds 0 []  - 0 Medium Wound Dressing one or multiple wounds []  - 0 Large Wound Dressing one or multiple wounds []  - 0 Application of Medications - topical []  - 0 Application of Medications - injection INTERVENTIONS - Miscellaneous []  - External ear exam 0 []  - 0 Specimen Collection (cultures, biopsies, blood, body fluids, etc.) []  - 0 Specimen(s) / Culture(s) sent or taken to Lab for analysis []  - 0 Patient Transfer (multiple staff / Civil Service fast streamer / Similar devices) []  - 0 Simple Staple / Suture removal (25 or less) []  - 0 Complex Staple / Suture removal (26 or more) []  - 0 Hypo / Hyperglycemic Management (close monitor of Blood Glucose) []  - 0 Ankle / Brachial Index (ABI) - do not check if billed separately X- 1 5 Vital Signs [name], [name] ([id number]) Has the patient been seen at the hospital within the last three years: Yes Total Score: 60 Level Of Care: New/Established - Level 2 Electronic Signature(s) Signed: 03/30/2018 3:04:13 PM By: Alric Quan Entered By: Alric Quan on 03/30/2018 11:26:53 [name], [name] ([id number]) -------------------------------------------------------------------------------- Encounter Discharge Information Details Patient Name: [name], [name] Date of Service: 03/30/2018 9:30 AM Medical Record Number: [id number] Patient Account Number: [id number] Date of Birth/Sex: [birth year] (57 y.o. M) Treating RN: [name] Primary Care  Najmo Pardue: [name] Other Clinician: Referring Desha Bitner: [name] Treating Malikah Principato/Extender: Cathie Olden in Treatment: 25 Encounter Discharge Information Items Discharge Condition: Stable Ambulatory Status: Wheelchair Discharge Destination: Home Transportation: Private Auto Accompanied By: spouse Schedule Follow-up Appointment: No Clinical Summary of Care: Electronic Signature(s) Signed: 03/30/2018 3:04:13 PM By: Alric Quan Entered By: Alric Quan on 03/30/2018 10:08:21 Elliott, [name] ([id number]) -------------------------------------------------------------------------------- Lower Extremity Assessment Details Patient Name: Fusilier, Kazuma B. Date of Service: 03/30/2018 9:30 AM Medical Record Number: [id number] Patient Account Number: [id number] Date of Birth/Sex: 1961/08/12 (57 y.o. M) Treating RN: Secundino Ginger Primary Care Hazim Treadway: [name] Other Clinician: Referring Bruna Dills: [name] Treating Jennilee Demarco/Extender: Lawanda Cousins Weeks in Treatment: 25 Electronic Signature(s) Signed: 03/30/2018 2:00:15 PM By: Secundino Ginger Entered By: Secundino Ginger on 03/30/2018 09:59:37 Corbit, Tayshawn B. ([id number]) -------------------------------------------------------------------------------- Multi Wound Chart Details Patient Name: Rajan, Samuel B. Date of Service: 03/30/2018 9:30 AM Medical Record Number: [id number] Patient Account Number: [id number] Date of Birth/Sex: [birth year] (57 y.o. M) Treating RN: [name] Primary Care Nylan Nakatani: [name] Other Clinician: Referring Georgie Eduardo: [name] Treating Keyosha Tiedt/Extender: Cathie Olden in Treatment: 25 Vital Signs Height(in): 71 Pulse(bpm): 9 Weight(lbs): 178 Blood Pressure(mmHg): 131/72 Body Mass Index(BMI): 25 Temperature(F): 98.3 Respiratory Rate 16 (breaths/min): Photos: [N/A:N/A] Wound Location: Right Amputation Site - Below N/A N/A Knee Wounding Event: Surgical Injury  N/A N/A Primary Etiology: Open Surgical Wound N/A N/A Comorbid History: Hypertension, Peripheral N/A N/A Arterial Disease, Crohnos, Type II Diabetes Date Acquired: 06/27/2017 N/A N/A Weeks of Treatment: 18 N/A N/A Wound Status: Healed - Epithelialized N/A N/A Measurements L x W x D 0x0x0 N/A N/A (cm) Area (cm) : 0 N/A N/A Volume (cm) : 0 N/A N/A % Reduction in Area: 100.00% N/A N/A % Reduction in Volume: 100.00% N/A N/A Classification: Full Thickness Without N/A N/A Exposed Support Structures Exudate Amount: None Present N/A N/A  Wound Margin: Flat and Intact N/A N/A Granulation Amount: None Present (0%) N/A N/A Necrotic Amount: Small (1-33%) N/A N/A Exposed Structures: Fascia: No N/A N/A Fat Layer (Subcutaneous Tissue) Exposed: No Tendon: No Muscle: No Joint: No Bone: No Wehrly, Isael B. ([id number]) Epithelialization: None N/A N/A Periwound Skin Texture: Scarring: Yes N/A N/A Excoriation: No Induration: No Callus: No Crepitus: No Rash: No Periwound Skin Moisture: Maceration: No N/A N/A Dry/Scaly: No Periwound Skin Color: Atrophie Blanche: No N/A N/A Cyanosis: No Ecchymosis: No Erythema: No Hemosiderin Staining: No Mottled: No Pallor: No Rubor: No Temperature: No Abnormality N/A N/A Tenderness on Palpation: No N/A N/A Wound Preparation: Ulcer Cleansing: N/A N/A Rinsed/Irrigated with Saline Treatment Notes Electronic Signature(s) Signed: 03/30/2018 10:21:31 AM By: Lawanda Cousins Entered By: Lawanda Cousins on 03/30/2018 10:21:30 [name], [name] ([id number]) -------------------------------------------------------------------------------- [place] Details Patient Name: [name], [name] Date of Service: 03/30/2018 9:30 AM Medical Record Number: [id number] Patient Account Number: [id number] Date of Birth/Sex: [birth year] (57 y.o. M) Treating RN: [name] Primary Care Scott Vanderveer: [name] Other Clinician: Referring Saphia Vanderford:  [name] Treating Bethanee Redondo/Extender: Lawanda Cousins Weeks in Treatment: 25 Active Inactive Electronic Signature(s) Signed: 03/30/2018 3:04:13 PM By: Alric Quan Entered By: Alric Quan on 03/30/2018 10:06:34 [name], [name] ([id number]) -------------------------------------------------------------------------------- Pain Assessment Details Patient Name: [name], [name] Date of Service: 03/30/2018 9:30 AM Medical Record Number: [id number] Patient Account Number: [id number] Date of Birth/Sex: 29-Dec-1960 (57 y.o. M) Treating RN: Secundino Ginger Primary Care Lemya Greenwell: [name] Other Clinician: Referring Haig Gerardo: [name] Treating Kerianne Gurr/Extender: Cathie Olden in Treatment: 25 Active Problems Location of Pain Severity and Description of Pain Patient Has Paino No Site Locations Pain Management and Medication Current Pain Management: Goals for Pain Management Topical or injectable lidocaine is offered to patient for acute pain when surgical debridement is performed. If needed, Patient is instructed to use over the counter pain medication for the following 24-48 hours after debridement. Wound care MDs do not prescribed pain medications. Patient has chronic pain or uncontrolled pain. Patient has been instructed to make an appointment with their Primary Care Physician for pain management. Electronic Signature(s) Signed: 03/30/2018 2:00:15 PM By: Secundino Ginger Entered By: Secundino Ginger on 03/30/2018 09:47:09 [name], [name] ([id number]) -------------------------------------------------------------------------------- Patient/Caregiver Education Details Patient Name: [name], Valen B. Date of Service: 03/30/2018 9:30 AM Medical Record Number: [id number] Patient Account Number: [id number] Date of Birth/Gender: [birth year] (57 y.o. M) Treating RN: [name] Primary Care Physician: [name] Other Clinician: Referring Physician: [name] Treating  Physician/Extender: Cathie Olden in Treatment: 25 Education Assessment Education Provided To: Patient Education Topics Provided Wound/Skin Impairment: Handouts: Other: Please call our office if you have any questions or concerns. Methods: Explain/Verbal Responses: State content correctly Electronic Signature(s) Signed: 03/30/2018 3:04:13 PM By: Alric Quan Entered By: Alric Quan on 03/30/2018 10:08:47 Constantine, [name] ([id number]) -------------------------------------------------------------------------------- Wound Assessment Details Patient Name: Moors, Dontre B. Date of Service: 03/30/2018 9:30 AM Medical Record Number: [id number] Patient Account Number: [id number] Date of Birth/Sex: [birth year] (57 y.o. M) Treating RN: [name] Primary Care Adryan Druckenmiller: [name] Other Clinician: Referring Kariem Wolfson: [name] Treating Ralph Benavidez/Extender: Lawanda Cousins Weeks in Treatment: 25 Wound Status Wound Number: 2 Primary Open Surgical Wound Etiology: Wound Location: Right Amputation Site - Below Knee Wound Healed - Epithelialized Wounding Event: Surgical Injury Status: Date Acquired: 06/27/2017 Comorbid Hypertension, Peripheral Arterial Disease, Weeks Of Treatment: 18 History: Crohnos, Type II Diabetes Clustered Wound: No Photos Photo Uploaded By: Secundino Ginger on 03/30/2018 10:05:49 Wound Measurements Length: (cm) 0 %  Reduc Width: (cm) 0 % Reduc Depth: (cm) 0 Epithel Area: (cm) 0 Volume: (cm) 0 tion in Area: 100% tion in Volume: 100% ialization: None Wound Description Full Thickness Without Exposed Support Foul Od Classification: Structures Slough/ Wound Margin: Flat and Intact Exudate None Present Amount: or After Cleansing: No Fibrino No Wound Bed Granulation Amount: None Present (0%) Exposed Structure Necrotic Amount: Small (1-33%) Fascia Exposed: No Necrotic Quality: Adherent Slough Fat Layer (Subcutaneous Tissue) Exposed:  No Tendon Exposed: No Muscle Exposed: No Joint Exposed: No Bone Exposed: No Periwound Skin Texture Texture Color Kilmer, Rainen B. ([id number]) No Abnormalities Noted: No No Abnormalities Noted: No Callus: No Atrophie Blanche: No Crepitus: No Cyanosis: No Excoriation: No Ecchymosis: No Induration: No Erythema: No Rash: No Hemosiderin Staining: No Scarring: Yes Mottled: No Pallor: No Moisture Rubor: No No Abnormalities Noted: No Dry / Scaly: No Temperature / Pain Maceration: No Temperature: No Abnormality Wound Preparation Ulcer Cleansing: Rinsed/Irrigated with Saline Electronic Signature(s) Signed: 03/30/2018 3:04:13 PM By: Alric Quan Entered By: Alric Quan on 03/30/2018 10:06:59 [place], [name] ([id number]) -------------------------------------------------------------------------------- Vitals Details Patient Name: Carn, Cobey B. Date of Service: 03/30/2018 9:30 AM Medical Record Number: [id number] Patient Account Number: [id number] Date of Birth/Sex: April 25, 1961 (57 y.o. M) Treating RN: Secundino Ginger Primary Care Dilynn Munroe: [name] Other Clinician: Referring Keionna Kinnaird: [name] Treating Lashondra Vaquerano/Extender: Cathie Olden in Treatment: 25 Vital Signs Time Taken: 09:47 Temperature (F): 98.3 Height (in): 71 Pulse (bpm): 56 Weight (lbs): 178 Respiratory Rate (breaths/min): 16 Body Mass Index (BMI): 24.8 Blood Pressure (mmHg): 131/72 Reference Range: 80 - 120 mg / dl Electronic Signature(s) Signed: 03/30/2018 2:00:15 PM By: Secundino Ginger Entered BySecundino Ginger on 03/30/2018 09:52:04

## 2018-04-12 MED ORDER — GENERIC EXTERNAL MEDICATION
1.70 | Status: DC
Start: ? — End: 2018-04-12

## 2018-04-12 MED ORDER — ASPIRIN 81 MG PO CHEW
81.00 | CHEWABLE_TABLET | ORAL | Status: DC
Start: 2018-04-13 — End: 2018-04-12

## 2018-04-12 MED ORDER — GENERIC EXTERNAL MEDICATION
Status: DC
Start: ? — End: 2018-04-12

## 2018-04-12 MED ORDER — CARVEDILOL 6.25 MG PO TABS
25.00 | ORAL_TABLET | ORAL | Status: DC
Start: 2018-04-12 — End: 2018-04-12

## 2018-04-12 MED ORDER — CALCITRIOL 0.5 MCG PO CAPS
0.50 | ORAL_CAPSULE | ORAL | Status: DC
Start: ? — End: 2018-04-12

## 2018-04-12 MED ORDER — DEXTROSE 10 % IV SOLN
12.50 | INTRAVENOUS | Status: DC
Start: ? — End: 2018-04-12

## 2018-04-12 MED ORDER — SEVELAMER CARBONATE 800 MG PO TABS
800.00 | ORAL_TABLET | ORAL | Status: DC
Start: 2018-04-12 — End: 2018-04-12

## 2018-04-12 MED ORDER — ATORVASTATIN CALCIUM 40 MG PO TABS
80.00 | ORAL_TABLET | ORAL | Status: DC
Start: 2018-04-12 — End: 2018-04-12

## 2018-04-12 MED ORDER — ACETAMINOPHEN 325 MG PO TABS
650.00 | ORAL_TABLET | ORAL | Status: DC
Start: ? — End: 2018-04-12

## 2018-04-12 MED ORDER — FUROSEMIDE 20 MG PO TABS
80.00 | ORAL_TABLET | ORAL | Status: DC
Start: 2018-04-12 — End: 2018-04-12

## 2018-04-12 MED ORDER — INSULIN GLARGINE 100 UNIT/ML ~~LOC~~ SOLN
7.00 | SUBCUTANEOUS | Status: DC
Start: 2018-04-12 — End: 2018-04-12

## 2018-04-12 MED ORDER — AMLODIPINE BESYLATE 10 MG PO TABS
10.00 | ORAL_TABLET | ORAL | Status: DC
Start: 2018-04-12 — End: 2018-04-12

## 2018-04-12 MED ORDER — LEVETIRACETAM 500 MG PO TABS
750.00 | ORAL_TABLET | ORAL | Status: DC
Start: 2018-04-12 — End: 2018-04-12

## 2018-04-12 MED ORDER — CLOPIDOGREL BISULFATE 75 MG PO TABS
75.00 | ORAL_TABLET | ORAL | Status: DC
Start: 2018-04-13 — End: 2018-04-12

## 2018-04-12 MED ORDER — INSULIN LISPRO 100 UNIT/ML ~~LOC~~ SOLN
0.00 | SUBCUTANEOUS | Status: DC
Start: 2018-04-12 — End: 2018-04-12

## 2018-04-12 MED ORDER — SODIUM BICARBONATE 650 MG PO TABS
650.00 | ORAL_TABLET | ORAL | Status: DC
Start: 2018-04-12 — End: 2018-04-12

## 2018-04-17 ENCOUNTER — Ambulatory Visit: Payer: Self-pay | Admitting: Family Medicine

## 2018-04-21 ENCOUNTER — Encounter: Payer: Self-pay | Admitting: Family Medicine

## 2018-04-26 ENCOUNTER — Encounter: Payer: Self-pay | Admitting: Family Medicine

## 2018-04-26 ENCOUNTER — Ambulatory Visit: Payer: BLUE CROSS/BLUE SHIELD | Admitting: Family Medicine

## 2018-04-26 VITALS — BP 140/70 | HR 67 | Temp 98.7°F | Resp 18

## 2018-04-26 DIAGNOSIS — I739 Peripheral vascular disease, unspecified: Secondary | ICD-10-CM | POA: Diagnosis not present

## 2018-04-26 DIAGNOSIS — L02512 Cutaneous abscess of left hand: Secondary | ICD-10-CM | POA: Diagnosis not present

## 2018-04-26 DIAGNOSIS — E1129 Type 2 diabetes mellitus with other diabetic kidney complication: Secondary | ICD-10-CM

## 2018-04-26 DIAGNOSIS — Z794 Long term (current) use of insulin: Secondary | ICD-10-CM | POA: Diagnosis not present

## 2018-04-26 MED ORDER — AMOXICILLIN-POT CLAVULANATE 875-125 MG PO TABS: 1.0000 | ORAL_TABLET | Freq: Two times a day (BID) | ORAL | 0 refills | Status: DC

## 2018-04-26 MED ORDER — HYDROCODONE-ACETAMINOPHEN 7.5-325 MG PO TABS: 1.0000 | ORAL_TABLET | Freq: Four times a day (QID) | ORAL | 0 refills | Status: AC | PRN

## 2018-04-26 MED ORDER — FREESTYLE LIBRE SENSOR SYSTEM MISC: 12 refills | Status: DC

## 2018-04-26 NOTE — Progress Notes (Signed)
Patient: [name] Male    DOB: 07-13-1961   57 y.o.   MRN: [id number] Visit Date: 04/26/2018  Today's Provider: [name], MD   Chief Complaint  Patient presents with  . Hospitalization Follow-up   Subjective:    HPI  Follow up Hospitalization  Patient was admitted to [facility] on 04/10/2018 and discharged on 04/12/2018. He was treated for left foot ischemia with fem-pop angioplasty. He had sore on his toe which wasn't healing before the procedure. Treatment for this included admission and starting on Heparin drip.  He reports good compliance with treatment. He reports this condition is Improved. Patient states he still has some pain in his legs.  He also report sore on his left distal left finger which has been draining off and on for a couple of weeks. Drainage is yellow and sometimes looks purulent by patient report.   ------------------------------------------------------------------------------------       Allergies  Allergen Reactions  . Vancomycin Shortness Of Breath    Eyes watering, SOB, wheezing  . Cefepime Other (See Comments)  . Methotrexate Other (See Comments)    Blood count drops  . Tape      Current Outpatient Medications:  .  Alcohol Swabs PADS, Use as directed to check blood sugar three times daily for insulin dependent type 2 diabetes., Disp: 100 each, Rfl: 12 .  amLODipine (NORVASC) 10 MG tablet, TAKE 1 TABLET BY MOUTH ONCE DAILY AS NEEDED, Disp: 30 tablet, Rfl: 12 .  aspirin EC 81 MG tablet, Take 81 mg by mouth daily., Disp: , Rfl:  .  atorvastatin (LIPITOR) 80 MG tablet, Take 1 tablet (80 mg total) by mouth daily., Disp: 90 tablet, Rfl: 3 .  Blood Glucose Monitoring Suppl (ONE TOUCH ULTRA 2) w/Device KIT, Use as directed to check blood sugar three times daily. E11.9, Disp: 1 each, Rfl: 0 .  carvedilol (COREG) 25 MG tablet, Take 12.5 mg by mouth 2 (two) times daily. , Disp: , Rfl: 3 .  clopidogrel (PLAVIX) 75 MG tablet, Take  1 tablet (75 mg total) by mouth daily., Disp: 30 tablet, Rfl: 11 .  ferrous sulfate 325 (65 FE) MG tablet, Take 1 tablet by mouth daily., Disp: , Rfl:  .  furosemide (LASIX) 80 MG tablet, Take 1 tablet (80 mg total) by mouth 2 (two) times daily., Disp: 60 tablet, Rfl: 4 .  glucose blood (ONE TOUCH ULTRA TEST) test strip, Use as directed to check blood sugar three times daily. E11.9, Disp: 100 each, Rfl: 12 .  HYDROcodone-acetaminophen (NORCO/VICODIN) 5-325 MG tablet, Take 1 tablet by mouth every 6 (six) hours as needed for moderate pain or severe pain., Disp: 20 tablet, Rfl: 0 .  inFLIXimab (REMICADE) 100 MG injection, Inject into the vein every 6 (six) weeks. , Disp: , Rfl:  .  insulin glargine (LANTUS) 100 UNIT/ML injection, Inject into the skin at bedtime. Inject as directed by physician, Disp: , Rfl:  .  Insulin Pen Needle 32G X 6 MM MISC, Use to inject insulin via pen up to three times daily for insulin dependent type 2 diabetes., Disp: 100 each, Rfl: 12 .  levETIRAcetam (KEPPRA) 1000 MG tablet, Take 1,000 mg by mouth at bedtime. , Disp: , Rfl: 0 .  lisinopril (PRINIVIL,ZESTRIL) 10 MG tablet, Take 1 tablet by mouth daily., Disp: , Rfl: 1 .  mirtazapine (REMERON) 15 MG tablet, Take 7.5 mg by mouth at bedtime., Disp: , Rfl:  .  NOVOLOG FLEXPEN 100  UNIT/ML FlexPen, INJECT 15 UNITS SUBCUTANEOUSLY THREE TIMES DAILY, Disp: 15 mL, Rfl: 3 .  ONE TOUCH LANCETS MISC, Use as directed to check blood sugar three times daily. E11.9, Disp: 100 each, Rfl: 11 .  polyethylene glycol (MIRALAX / GLYCOLAX) packet, Take 1 packet by mouth as needed., Disp: , Rfl: 0 .  sevelamer carbonate (RENVELA) 800 MG tablet, TAKE 1 TABLET BY MOUTH THREE TIMES DAILY, Disp: 270 tablet, Rfl: 3 .  sodium bicarbonate 650 MG tablet, Take 650 mg by mouth 2 (two) times daily., Disp: , Rfl:  .  triamcinolone ointment (KENALOG) 0.1 %, Apply 1 application topically as needed. , Disp: , Rfl: 0  Review of Systems  Constitutional: Positive  for appetite change and fatigue. Negative for chills and fever.  Respiratory: Negative for chest tightness, shortness of breath and wheezing.   Cardiovascular: Negative for chest pain and palpitations.  Gastrointestinal: Positive for constipation. Negative for abdominal pain, nausea and vomiting.  Skin: Positive for color change (in finger of left hand) and wound (injury to finger on left hand).    Social History   Tobacco Use  . Smoking status: Never Smoker  . Smokeless tobacco: Never Used  Substance Use Topics  . Alcohol use: No   Objective:   BP 140/70 (BP Location: Right Arm, Patient Position: Sitting, Cuff Size: Normal)   Pulse 67   Temp 98.7 F (37.1 C) (Oral)   Resp 18   SpO2 97% Comment: room air Vitals:   04/26/18 1052  BP: 140/70  Pulse: 67  Resp: 18  Temp: 98.7 F (37.1 C)  TempSrc: Oral  SpO2: 97%     Physical Exam   General Appearance:    Alert, cooperative, no distress  Eyes:    PERRL, conjunctiva/corneas clear, EOM's intact       Lungs:     Clear to auscultation bilaterally, respirations unlabored  Heart:    Regular rate and rhythm  Derm:   Scantly draining wound on distal left finger adjacent to nail. Mildly swollen and erythematous.   Neurologic:   Awake, alert, oriented x 3. No apparent focal neurological           defect.   Ext:   Left pedal pulses present but week.        Assessment & Plan:     1. Abscess of finger of left hand  - amoxicillin-clavulanate (AUGMENTIN) 875-125 MG tablet; Take 1 tablet by mouth 2 (two) times daily for 7 days.  Dispense: 14 tablet; Refill: 0 - Aerobic culture  2. PVD (peripheral vascular disease) with claudication (HCC) Doing well 2 weeks post fem-pop angioplasty. Follow up vascular at [facility] as scheduled.   3. Type 2 diabetes mellitus with other diabetic kidney complication, with long-term current use of insulin ([place]) He is having a lot of problems with sores on fingers from frequent blood sticks and requests  trial of Freestyle Libra.        [name], MD  [facility]

## 2018-04-29 LAB — AEROBIC CULTURE

## 2018-05-01 ENCOUNTER — Telehealth: Payer: Self-pay | Admitting: *Deleted

## 2018-05-01 DIAGNOSIS — L02512 Cutaneous abscess of left hand: Secondary | ICD-10-CM

## 2018-05-01 MED ORDER — AMOXICILLIN-POT CLAVULANATE 875-125 MG PO TABS: 1.0000 | ORAL_TABLET | Freq: Two times a day (BID) | ORAL | 0 refills | Status: AC

## 2018-05-01 MED ORDER — FREESTYLE LIBRE READER DEVI: 1.0000 [IU] | Freq: Every day | 1 refills | Status: DC

## 2018-05-01 MED ORDER — FREESTYLE LIBRE READER DEVI: 1.0000 [IU] | Freq: Every day | 12 refills | Status: DC

## 2018-05-01 NOTE — Telephone Encounter (Signed)
Left detailed message on pt's vm notifying him rx was sent in to pharmacy.

## 2018-05-01 NOTE — Telephone Encounter (Signed)
Patient's wife was notified of results. Expressed understanding. Refill sent to pharmacy.

## 2018-05-01 NOTE — Telephone Encounter (Signed)
-----   Message from [name], MD sent at 04/30/2018  8:07 PM EDT ----- Culture shows staph infection which is senstive to antibiotic that was prescribed. Need to stay on augmenting one more week. Please send in refill to take BID for 7 more days.

## 2018-05-01 NOTE — Telephone Encounter (Signed)
Have sent in new prescription for reader

## 2018-05-01 NOTE — Telephone Encounter (Signed)
Patient's wife [name] stated they received the Freestyle sensor that sticks in the arm . However they did not get the wand or reader for the sensor. [name] stated device was 2 different rx's. Please advise?

## 2018-05-20 MED ORDER — DEXTROSE 10 % IV SOLN
12.50 | INTRAVENOUS | Status: DC
Start: ? — End: 2018-05-20

## 2018-05-20 MED ORDER — SEVELAMER CARBONATE 800 MG PO TABS
800.00 | ORAL_TABLET | ORAL | Status: DC
Start: 2018-05-20 — End: 2018-05-20

## 2018-05-20 MED ORDER — LEVETIRACETAM 750 MG PO TABS
750.00 | ORAL_TABLET | ORAL | Status: DC
Start: 2018-05-20 — End: 2018-05-20

## 2018-05-20 MED ORDER — LIDOCAINE-PRILOCAINE 2.5-2.5 % EX CREA
2.50 | TOPICAL_CREAM | CUTANEOUS | Status: DC
Start: ? — End: 2018-05-20

## 2018-05-20 MED ORDER — CARVEDILOL 25 MG PO TABS
25.00 | ORAL_TABLET | ORAL | Status: DC
Start: 2018-05-20 — End: 2018-05-20

## 2018-05-20 MED ORDER — AMLODIPINE BESYLATE 10 MG PO TABS
10.00 | ORAL_TABLET | ORAL | Status: DC
Start: 2018-05-20 — End: 2018-05-20

## 2018-05-20 MED ORDER — OXYCODONE HCL 5 MG PO TABS
5.00 | ORAL_TABLET | ORAL | Status: DC
Start: ? — End: 2018-05-20

## 2018-05-20 MED ORDER — INSULIN GLARGINE 100 UNIT/ML ~~LOC~~ SOLN
7.00 | SUBCUTANEOUS | Status: DC
Start: 2018-05-20 — End: 2018-05-20

## 2018-05-20 MED ORDER — ACETAMINOPHEN 325 MG PO TABS
650.00 | ORAL_TABLET | ORAL | Status: DC
Start: ? — End: 2018-05-20

## 2018-05-20 MED ORDER — EPOETIN ALFA-EPBX 4000 UNIT/ML IJ SOLN
8000.00 | INTRAMUSCULAR | Status: DC
Start: 2018-05-22 — End: 2018-05-20

## 2018-05-20 MED ORDER — HEPARIN SODIUM (PORCINE) 5000 UNIT/ML IJ SOLN
5000.00 | INTRAMUSCULAR | Status: DC
Start: 2018-05-20 — End: 2018-05-20

## 2018-05-20 MED ORDER — PARICALCITOL 2 MCG/ML IV SOLN
1.25 | INTRAVENOUS | Status: DC
Start: 2018-05-22 — End: 2018-05-20

## 2018-05-20 MED ORDER — GENERIC EXTERNAL MEDICATION
125.00 | Status: DC
Start: ? — End: 2018-05-20

## 2018-05-20 MED ORDER — ATORVASTATIN CALCIUM 80 MG PO TABS
80.00 | ORAL_TABLET | ORAL | Status: DC
Start: 2018-05-20 — End: 2018-05-20

## 2018-05-20 MED ORDER — ASPIRIN 81 MG PO CHEW
81.00 | CHEWABLE_TABLET | ORAL | Status: DC
Start: 2018-05-21 — End: 2018-05-20

## 2018-05-20 MED ORDER — FUROSEMIDE 80 MG PO TABS
80.00 | ORAL_TABLET | ORAL | Status: DC
Start: 2018-05-20 — End: 2018-05-20

## 2018-05-20 MED ORDER — INSULIN LISPRO 100 UNIT/ML ~~LOC~~ SOLN
.00 | SUBCUTANEOUS | Status: DC
Start: 2018-05-20 — End: 2018-05-20

## 2018-05-20 MED ORDER — DOXYCYCLINE HYCLATE 100 MG PO TABS
100.00 | ORAL_TABLET | ORAL | Status: DC
Start: 2018-05-20 — End: 2018-05-20

## 2018-05-20 MED ORDER — LEVOFLOXACIN 500 MG PO TABS
500.00 | ORAL_TABLET | ORAL | Status: DC
Start: 2018-05-22 — End: 2018-05-20

## 2018-05-20 MED ORDER — POLYETHYLENE GLYCOL 3350 17 G PO PACK
17.00 | PACK | ORAL | Status: DC
Start: 2018-05-21 — End: 2018-05-20

## 2018-05-20 MED ORDER — HYDROMORPHONE HCL 1 MG/ML IJ SOLN
0.50 | INTRAMUSCULAR | Status: DC
Start: ? — End: 2018-05-20

## 2018-05-20 MED ORDER — CLOPIDOGREL BISULFATE 75 MG PO TABS
75.00 | ORAL_TABLET | ORAL | Status: DC
Start: 2018-05-21 — End: 2018-05-20

## 2018-05-20 MED ORDER — HEPARIN SODIUM (PORCINE) 1000 UNIT/ML IJ SOLN
2000.00 | INTRAMUSCULAR | Status: DC
Start: ? — End: 2018-05-20

## 2018-05-22 ENCOUNTER — Other Ambulatory Visit: Payer: Self-pay

## 2018-05-22 DIAGNOSIS — E1129 Type 2 diabetes mellitus with other diabetic kidney complication: Secondary | ICD-10-CM

## 2018-05-22 DIAGNOSIS — Z794 Long term (current) use of insulin: Principal | ICD-10-CM

## 2018-05-22 NOTE — Telephone Encounter (Signed)
Patient is requesting refills. Thanks!

## 2018-05-23 MED ORDER — INSULIN GLARGINE 100 UNIT/ML ~~LOC~~ SOLN: SUBCUTANEOUS | 5 refills | Status: DC

## 2018-05-29 NOTE — Progress Notes (Deleted)
Patient: [name] Male    DOB: 08-19-1961   57 y.o.   MRN: [id number] Visit Date: 05/29/2018  Today's Provider: [name], MD   No chief complaint on file.  Subjective:    HPI   Diabetes Mellitus Type II, Follow-up:   Lab Results  Component Value Date   HGBA1C 7.0 01/31/2018   HGBA1C 5.9 10/20/2017   HGBA1C 10.1 (H) 04/21/2017   Last seen for diabetes 4 months ago.  Management since then includes; patient requested a trial of Freestyle libra due to having a lot of problems with sores on fingers. He reports {excellent/good/fair/poor:19665} compliance with treatment. He {ACTION; IS/IS MGN:00370488} having side effects. *** Current symptoms include {Symptoms; diabetes:14075} and have been {Desc; course:15616}. Home blood sugar records: {diabetes glucometry results:16657}  Episodes of hypoglycemia? {yes***/no:17258}   Current Insulin Regimen: *** Most Recent Eye Exam: *** Weight trend: {trend:16658} Prior visit with dietician: {yes/no:17258} Current diet: {diet habits:16563} Current exercise: {exercise types:16438}  ------------------------------------------------------------------------  End stage renal failure on dialysis ([place]) From 01/31/2018-no changes. Stable, continue regular follow up with renal.    Allergies  Allergen Reactions  . Vancomycin Shortness Of Breath    Eyes watering, SOB, wheezing  . Cefepime Other (See Comments)  . Methotrexate Other (See Comments)    Blood count drops  . Tape      Current Outpatient Medications:  .  Alcohol Swabs PADS, Use as directed to check blood sugar three times daily for insulin dependent type 2 diabetes., Disp: 100 each, Rfl: 12 .  amLODipine (NORVASC) 10 MG tablet, TAKE 1 TABLET BY MOUTH ONCE DAILY AS NEEDED, Disp: 30 tablet, Rfl: 12 .  aspirin EC 81 MG tablet, Take 81 mg by mouth daily., Disp: , Rfl:  .  atorvastatin (LIPITOR) 80 MG tablet, Take 1 tablet (80 mg total) by mouth daily., Disp: 90  tablet, Rfl: 3 .  Blood Glucose Monitoring Suppl (ONE TOUCH ULTRA 2) w/Device KIT, Use as directed to check blood sugar three times daily. E11.9, Disp: 1 each, Rfl: 0 .  carvedilol (COREG) 25 MG tablet, Take 12.5 mg by mouth 2 (two) times daily. , Disp: , Rfl: 3 .  clopidogrel (PLAVIX) 75 MG tablet, Take 1 tablet (75 mg total) by mouth daily., Disp: 30 tablet, Rfl: 11 .  Continuous Blood Gluc Receiver (FREESTYLE LIBRE READER) DEVI, 1 Units by Does not apply route daily. USE TO CHECK BLOOD SUGAR UP TO FOUR TIMES A DAY AS DIRECTED. DX INSULIN DEPENDENT DIABETES, Disp: 1 Device, Rfl: 1 .  Continuous Blood Gluc Sensor ([place]) MISC, Use to check blood sugar four times daily for insulin dependent diabetes., Disp: 1 each, Rfl: 12 .  ferrous sulfate 325 (65 FE) MG tablet, Take 1 tablet by mouth daily., Disp: , Rfl:  .  furosemide (LASIX) 80 MG tablet, Take 1 tablet (80 mg total) by mouth 2 (two) times daily., Disp: 60 tablet, Rfl: 4 .  glucose blood (ONE TOUCH ULTRA TEST) test strip, Use as directed to check blood sugar three times daily. E11.9, Disp: 100 each, Rfl: 12 .  inFLIXimab (REMICADE) 100 MG injection, Inject into the vein every 6 (six) weeks. , Disp: , Rfl:  .  insulin glargine (LANTUS) 100 UNIT/ML injection, Inject as directed by physician up to 20 units daily, Disp: 10 mL, Rfl: 5 .  Insulin Pen Needle 32G X 6 MM MISC, Use to inject insulin via pen up to three times daily for insulin  dependent type 2 diabetes., Disp: 100 each, Rfl: 12 .  levETIRAcetam (KEPPRA) 1000 MG tablet, Take 1,000 mg by mouth at bedtime. , Disp: , Rfl: 0 .  lisinopril (PRINIVIL,ZESTRIL) 10 MG tablet, Take 1 tablet by mouth daily., Disp: , Rfl: 1 .  mirtazapine (REMERON) 15 MG tablet, Take 7.5 mg by mouth at bedtime., Disp: , Rfl:  .  NOVOLOG FLEXPEN 100 UNIT/ML FlexPen, INJECT 15 UNITS SUBCUTANEOUSLY THREE TIMES DAILY, Disp: 15 mL, Rfl: 3 .  ONE TOUCH LANCETS MISC, Use as directed to check blood sugar  three times daily. E11.9, Disp: 100 each, Rfl: 11 .  polyethylene glycol (MIRALAX / GLYCOLAX) packet, Take 1 packet by mouth as needed., Disp: , Rfl: 0 .  sevelamer carbonate (RENVELA) 800 MG tablet, TAKE 1 TABLET BY MOUTH THREE TIMES DAILY, Disp: 270 tablet, Rfl: 3 .  sodium bicarbonate 650 MG tablet, Take 650 mg by mouth 2 (two) times daily., Disp: , Rfl:  .  triamcinolone ointment (KENALOG) 0.1 %, Apply 1 application topically as needed. , Disp: , Rfl: 0  Review of Systems  Constitutional: Negative for appetite change, chills and fever.  Respiratory: Negative for chest tightness, shortness of breath and wheezing.   Cardiovascular: Negative for chest pain and palpitations.  Gastrointestinal: Negative for abdominal pain, nausea and vomiting.    Social History   Tobacco Use  . Smoking status: Never Smoker  . Smokeless tobacco: Never Used  Substance Use Topics  . Alcohol use: No   Objective:   There were no vitals taken for this visit. There were no vitals filed for this visit.   Physical Exam      Assessment & Plan:           [name], MD  [facility]

## 2018-05-30 ENCOUNTER — Telehealth: Payer: Self-pay

## 2018-05-30 ENCOUNTER — Ambulatory Visit: Payer: Self-pay | Admitting: Family Medicine

## 2018-05-30 NOTE — Telephone Encounter (Signed)
FYI: Patient's wife [name] called stating that patient missed his appointment today due to being hospitalized at [facility]. Patient had a toe amputation last week. They plan on discharging patient later in the week. Patient will then be discharged to rehab. [name] states that she is going to be faxing over her FMLA paperwork today for Dr. Caryn Section to fill out. She says she has missed several days of work due taking [name] to doctors appointments and to the hospital.

## 2018-06-01 MED ORDER — HEPARIN SODIUM (PORCINE) 1000 UNIT/ML IJ SOLN
2000.00 | INTRAMUSCULAR | Status: DC
Start: ? — End: 2018-06-01

## 2018-06-01 MED ORDER — INSULIN REGULAR HUMAN 100 UNIT/ML IJ SOLN
0.00 | INTRAMUSCULAR | Status: DC
Start: 2018-06-01 — End: 2018-06-01

## 2018-06-01 MED ORDER — LISINOPRIL 10 MG PO TABS
10.00 | ORAL_TABLET | ORAL | Status: DC
Start: 2018-06-02 — End: 2018-06-01

## 2018-06-01 MED ORDER — ONDANSETRON 4 MG PO TBDP
4.00 | ORAL_TABLET | ORAL | Status: DC
Start: ? — End: 2018-06-01

## 2018-06-01 MED ORDER — EPOETIN ALFA-EPBX 4000 UNIT/ML IJ SOLN
8000.00 | INTRAMUSCULAR | Status: DC
Start: 2018-06-02 — End: 2018-06-01

## 2018-06-01 MED ORDER — GENERIC EXTERNAL MEDICATION
15.00 | Status: DC
Start: ? — End: 2018-06-01

## 2018-06-01 MED ORDER — ATORVASTATIN CALCIUM 80 MG PO TABS
80.00 | ORAL_TABLET | ORAL | Status: DC
Start: 2018-06-02 — End: 2018-06-01

## 2018-06-01 MED ORDER — BISACODYL 10 MG RE SUPP
10.00 | RECTAL | Status: DC
Start: ? — End: 2018-06-01

## 2018-06-01 MED ORDER — ACETAMINOPHEN 500 MG PO TABS
1000.00 | ORAL_TABLET | ORAL | Status: DC
Start: 2018-06-01 — End: 2018-06-01

## 2018-06-01 MED ORDER — GENERIC EXTERNAL MEDICATION
1.00 | Status: DC
Start: 2018-06-01 — End: 2018-06-01

## 2018-06-01 MED ORDER — POLYETHYLENE GLYCOL 3350 17 G PO PACK
17.00 | PACK | ORAL | Status: DC
Start: 2018-06-01 — End: 2018-06-01

## 2018-06-01 MED ORDER — OXYCODONE HCL 5 MG PO TABS
10.00 | ORAL_TABLET | ORAL | Status: DC
Start: ? — End: 2018-06-01

## 2018-06-01 MED ORDER — DOCUSATE SODIUM 100 MG PO CAPS
100.00 | ORAL_CAPSULE | ORAL | Status: DC
Start: 2018-06-01 — End: 2018-06-01

## 2018-06-01 MED ORDER — HYDROXYZINE HCL 25 MG PO TABS
25.00 | ORAL_TABLET | ORAL | Status: DC
Start: ? — End: 2018-06-01

## 2018-06-01 MED ORDER — AMLODIPINE BESYLATE 10 MG PO TABS
10.00 | ORAL_TABLET | ORAL | Status: DC
Start: 2018-06-01 — End: 2018-06-01

## 2018-06-01 MED ORDER — LIDOCAINE 5 % EX PTCH
3.00 | MEDICATED_PATCH | CUTANEOUS | Status: DC
Start: 2018-06-02 — End: 2018-06-01

## 2018-06-01 MED ORDER — DEXTROSE 10 % IV SOLN
12.50 | INTRAVENOUS | Status: DC
Start: ? — End: 2018-06-01

## 2018-06-01 MED ORDER — DICLOFENAC SODIUM 1 % TD GEL
4.00 | TRANSDERMAL | Status: DC
Start: 2018-06-01 — End: 2018-06-01

## 2018-06-01 MED ORDER — GENERIC EXTERNAL MEDICATION
Status: DC
Start: ? — End: 2018-06-01

## 2018-06-01 MED ORDER — ASPIRIN 81 MG PO CHEW
81.00 | CHEWABLE_TABLET | ORAL | Status: DC
Start: 2018-06-02 — End: 2018-06-01

## 2018-06-01 MED ORDER — TAMSULOSIN HCL 0.4 MG PO CAPS
0.40 | ORAL_CAPSULE | ORAL | Status: DC
Start: 2018-06-02 — End: 2018-06-01

## 2018-06-01 MED ORDER — LEVETIRACETAM 500 MG PO TABS
750.00 | ORAL_TABLET | ORAL | Status: DC
Start: 2018-06-01 — End: 2018-06-01

## 2018-06-01 MED ORDER — HEPARIN SODIUM (PORCINE) 5000 UNIT/ML IJ SOLN
5000.00 | INTRAMUSCULAR | Status: DC
Start: 2018-06-01 — End: 2018-06-01

## 2018-06-05 ENCOUNTER — Telehealth: Payer: Self-pay | Admitting: Family Medicine

## 2018-06-05 ENCOUNTER — Encounter: Payer: Self-pay | Admitting: Emergency Medicine

## 2018-06-05 ENCOUNTER — Emergency Department
Admission: EM | Admit: 2018-06-05 | Discharge: 2018-06-05 | Disposition: A | Discharge status: Home / Self Care | Payer: BLUE CROSS/BLUE SHIELD | Attending: Emergency Medicine | Admitting: Emergency Medicine

## 2018-06-05 DIAGNOSIS — E1140 Type 2 diabetes mellitus with diabetic neuropathy, unspecified: Secondary | ICD-10-CM | POA: Insufficient documentation

## 2018-06-05 DIAGNOSIS — Z86718 Personal history of other venous thrombosis and embolism: Secondary | ICD-10-CM | POA: Diagnosis not present

## 2018-06-05 DIAGNOSIS — N186 End stage renal disease: Secondary | ICD-10-CM | POA: Insufficient documentation

## 2018-06-05 DIAGNOSIS — Z992 Dependence on renal dialysis: Secondary | ICD-10-CM | POA: Insufficient documentation

## 2018-06-05 DIAGNOSIS — Z794 Long term (current) use of insulin: Secondary | ICD-10-CM | POA: Insufficient documentation

## 2018-06-05 DIAGNOSIS — Z79899 Other long term (current) drug therapy: Secondary | ICD-10-CM | POA: Insufficient documentation

## 2018-06-05 DIAGNOSIS — E1122 Type 2 diabetes mellitus with diabetic chronic kidney disease: Secondary | ICD-10-CM | POA: Insufficient documentation

## 2018-06-05 DIAGNOSIS — Z8673 Personal history of transient ischemic attack (TIA), and cerebral infarction without residual deficits: Secondary | ICD-10-CM | POA: Insufficient documentation

## 2018-06-05 DIAGNOSIS — Z7982 Long term (current) use of aspirin: Secondary | ICD-10-CM | POA: Insufficient documentation

## 2018-06-05 DIAGNOSIS — Z7902 Long term (current) use of antithrombotics/antiplatelets: Secondary | ICD-10-CM | POA: Insufficient documentation

## 2018-06-05 DIAGNOSIS — I120 Hypertensive chronic kidney disease with stage 5 chronic kidney disease or end stage renal disease: Secondary | ICD-10-CM | POA: Insufficient documentation

## 2018-06-05 DIAGNOSIS — G8918 Other acute postprocedural pain: Secondary | ICD-10-CM | POA: Diagnosis present

## 2018-06-05 MED ORDER — OXYCODONE-ACETAMINOPHEN 5-325 MG PO TABS
1.0000 | ORAL_TABLET | Freq: Once | ORAL | Status: AC
  Administered 2018-06-05: 1 via ORAL
  Filled 2018-06-05: qty 1

## 2018-06-05 NOTE — ED Triage Notes (Signed)
Pt reports he had all of his toes on his left foot amputated at [facility] last week. Pt reports the area continues to throb and he thinks he is bleeding.  Pt reports that the pain is so bad he had to leave dialysis. Pt reports they can finish him today if he gets back in time or he can go tomorrow.

## 2018-06-05 NOTE — Telephone Encounter (Signed)
Patient's wife advised form is ready.

## 2018-06-05 NOTE — Telephone Encounter (Signed)
Pt's wife called asking if we have received the fax for the papers  asking for extended FMLA time for her taking care of her husband.  She said she needs to know if they have been filled out.   She faxed then to [place] last week.  They can be faxed  back to her or she can come by and pick them up when they are ready.  Pt's CB# [phone]  Thanks Con Memos

## 2018-06-05 NOTE — Discharge Instructions (Addendum)
If you have fever, increased pain, redness from the area, your foot gets hot, you have significant bleeding return to the emergency room.  Follow closely with your surgeons tomorrow.

## 2018-06-05 NOTE — ED Triage Notes (Signed)
ARrives via [facility].  Had toes amputated last week, but is concerned with possible bleeding to surgical site.  Patient did not have dialysis today.  If patient is discharged before 1600, patient can have dialysis today, if not he can reschedule for tomorrow.

## 2018-06-05 NOTE — ED Provider Notes (Signed)
[facility] Emergency Department Provider Note  ____________________________________________   I have reviewed the triage vital signs and the nursing notes. Where available I have reviewed prior notes and, if possible and indicated, outside hospital notes.    HISTORY  Chief Complaint Foot Pain    HPI [name] is a 57 y.o. male  Had his toes amputated at [facility] last week, states he had a "little bit of blood on the tissue" and it made him concerned.  He is taking oxycodone at home, has good pain relief from it but he is at dialysis and was unable to get his Percocet.  For these reasons apparently he came in.  He has no other complaints.  He is not currently bleeding and he would like to go  No fever, no complaints of infection symptoms  Past Medical History:  Diagnosis Date  . Anemia   . Crohn disease ([place])   . Diabetes mellitus without complication ([place])   . DVT of lower extremity (deep venous thrombosis) ([place]) 2016  . Hidradenitis suppurativa   . Hypertension   . ICH (intracerebral hemorrhage) ([place])   . Peritonitis ([place]) 04/21/2017  . Pyogenic arthritis of knee ([place]) 02/04/2016  . Renal disorder   . Sepsis ([place]) 01/12/2018    Patient Active Problem List   Diagnosis Date Noted  . Encephalopathy 12/04/2017  . Pressure injury of skin 12/04/2017  . S/P BKA (below knee amputation) unilateral, right ([place]) 10/20/2017  . Atherosclerosis of native arteries of extremity with rest pain ([place]) 08/05/2017  . Empyema ([place]) 05/20/2017  . History of CVA (cerebrovascular accident) 04/15/2017  . Seizure ([place]) 04/15/2017  . End stage renal failure on dialysis ([place]) 04/12/2016  . Aphthae 02/20/2016  . Hidradenitis 02/20/2016  . Leg pain 02/20/2016  . Neuropathy 02/20/2016  . Narrowing of intervertebral disc space 08/29/2015  . Vascular disorder of lower extremity 08/29/2015  . Failure of erection 08/29/2015  . Accumulation of fluid in tissues 08/29/2015  .  Hypercholesteremia 08/29/2015  . Hypertension 08/29/2015  . Anemia due to chronic kidney disease 05/26/2015  . Venous insufficiency of leg 09/04/2014  . Deep vein thrombosis of lower extremity ([place]) 08/16/2014  . Prostatic intraepithelial neoplasia 11/02/2013  . Elevated prostate specific antigen (PSA) 09/11/2013  . Benign prostatic hyperplasia with urinary obstruction 08/13/2013  . Spermatocele 08/13/2013  . Avitaminosis D 01/25/2013  . Abnormal presence of protein in urine 03/28/2012  . Type 2 diabetes mellitus with kidney complication, with long-term current use of insulin ([place]) 03/28/2012  . Crohn's disease ([place]) 08/03/2011    Past Surgical History:  Procedure Laterality Date  . ABDOMINAL SURGERY    . ANGIOPLASTY Left    left fem-pop at [facility] 04-11-2018  . BELOW KNEE LEG AMPUTATION Right 08/2017   [facility]  . DIALYSIS/PERMA CATHETER INSERTION N/A 12/09/2017   Procedure: DIALYSIS/PERMA CATHETER INSERTION;  Surgeon: Katha Cabal, MD;  Location: [place] [facility];  Service: Cardiovascular;  Laterality: N/A;  . DIALYSIS/PERMA CATHETER INSERTION N/A 12/12/2017   Procedure: DIALYSIS/PERMA CATHETER INSERTION;  Surgeon: Algernon Huxley, MD;  Location: [place] [facility];  Service: Cardiovascular;  Laterality: N/A;  . DIALYSIS/PERMA CATHETER REMOVAL Left 12/09/2017   Procedure: DIALYSIS/PERMA CATHETER REMOVAL;  Surgeon: Katha Cabal, MD;  Location: [place] [facility];  Service: Cardiovascular;  Laterality: Left;  . KNEE SURGERY Left 02/04/2016   [facility]  . LOWER EXTREMITY ANGIOGRAPHY Right 08/08/2017   Procedure: Lower Extremity Angiography;  Surgeon: Algernon Huxley, MD;  Location: [place] [facility];  Service: Cardiovascular;  Laterality: Right;  . LOWER EXTREMITY ANGIOGRAPHY Right 08/22/2017   Procedure: Lower Extremity Angiography;  Surgeon: Algernon Huxley, MD;  Location: [place] [facility];  Service: Cardiovascular;  Laterality: Right;  . LOWER EXTREMITY INTERVENTION  08/08/2017    Procedure: LOWER EXTREMITY INTERVENTION;  Surgeon: Algernon Huxley, MD;  Location: [place] [facility];  Service: Cardiovascular;;  . LOWER EXTREMITY INTERVENTION  08/22/2017   Procedure: LOWER EXTREMITY INTERVENTION;  Surgeon: Algernon Huxley, MD;  Location: [place] [facility];  Service: Cardiovascular;;    Prior to Admission medications   Medication Sig Start Date End Date Taking? Authorizing Provider  Alcohol Swabs PADS Use as directed to check blood sugar three times daily for insulin dependent type 2 diabetes. 10/20/17   [name], MD  amLODipine (NORVASC) 10 MG tablet TAKE 1 TABLET BY MOUTH ONCE DAILY AS NEEDED 02/28/18   [name], MD  aspirin EC 81 MG tablet Take 81 mg by mouth daily.    [provider]  atorvastatin (LIPITOR) 80 MG tablet Take 1 tablet (80 mg total) by mouth daily. 06/09/17   [name], MD  Blood Glucose Monitoring Suppl (ONE TOUCH ULTRA 2) w/Device KIT Use as directed to check blood sugar three times daily. E11.9 02/20/18   [name], MD  carvedilol (COREG) 25 MG tablet Take 12.5 mg by mouth 2 (two) times daily.  11/02/15   [provider]  clopidogrel (PLAVIX) 75 MG tablet Take 1 tablet (75 mg total) by mouth daily. 08/08/17   Algernon Huxley, MD  Continuous Blood Gluc Receiver (FREESTYLE LIBRE READER) DEVI 1 Units by Does not apply route daily. USE TO CHECK BLOOD SUGAR UP TO FOUR TIMES A DAY AS DIRECTED. DX INSULIN DEPENDENT DIABETES 05/01/18   [name], MD  Continuous Blood Gluc Sensor ([place]) MISC Use to check blood sugar four times daily for insulin dependent diabetes. 04/26/18   [name], MD  ferrous sulfate 325 (65 FE) MG tablet Take 1 tablet by mouth daily. 03/24/15   [provider]  furosemide (LASIX) 80 MG tablet Take 1 tablet (80 mg total) by mouth 2 (two) times daily. 06/01/17   [name], MD  glucose blood (ONE TOUCH ULTRA TEST) test strip Use as directed to check blood sugar  three times daily. E11.9 02/20/18   [name], MD  inFLIXimab (REMICADE) 100 MG injection Inject into the vein every 6 (six) weeks.     [provider]  insulin glargine (LANTUS) 100 UNIT/ML injection Inject as directed by physician up to 20 units daily 05/23/18   [name], MD  Insulin Pen Needle 32G X 6 MM MISC Use to inject insulin via pen up to three times daily for insulin dependent type 2 diabetes. 10/20/17   [name], MD  levETIRAcetam (KEPPRA) 1000 MG tablet Take 1,000 mg by mouth at bedtime.  04/20/17   [provider]  lisinopril (PRINIVIL,ZESTRIL) 10 MG tablet Take 1 tablet by mouth daily. 08/20/17   [provider]  mirtazapine (REMERON) 15 MG tablet Take 7.5 mg by mouth at bedtime.    [provider]  NOVOLOG FLEXPEN 100 UNIT/ML FlexPen INJECT 15 UNITS SUBCUTANEOUSLY THREE TIMES DAILY 12/25/17   [name], MD  ONE [facility] LANCETS MISC Use as directed to check blood sugar three times daily. E11.9 02/20/18   [name], MD  polyethylene glycol (MIRALAX / GLYCOLAX) packet Take 1 packet by mouth as needed. 05/30/17   [provider]  sevelamer carbonate (RENVELA) 800 MG tablet TAKE 1 TABLET BY MOUTH THREE TIMES DAILY 03/09/18   [name], MD  sodium bicarbonate 650 MG tablet Take 650 mg by mouth 2 (two) times daily.    [provider]  triamcinolone ointment (KENALOG) 0.1 % Apply 1 application topically as needed.  05/02/17   [provider]    Allergies Vancomycin; Cefepime; Methotrexate; and Tape  Family History  Problem Relation Age of Onset  . Irritable bowel syndrome Sister   . Diabetes Sister   . Heart disease Mother   . Diabetes Mother   . Heart disease Father   . Rheumatic fever Father        as child  . Psoriasis Brother   . Arthritis Brother   . Diabetes Sister   . Diabetes Sister     Social History Social History   Tobacco Use  . Smoking status: Never Smoker  . Smokeless  tobacco: Never Used  Substance Use Topics  . Alcohol use: No  . Drug use: Yes    Types: Marijuana    Review of Systems Constitutional: No fever/chills Eyes: No visual changes. ENT: No sore throat. No stiff neck no neck pain Cardiovascular: Denies chest pain. Respiratory: Denies shortness of breath. Gastrointestinal:   no vomiting.  No diarrhea.  No constipation. Genitourinary: Negative for dysuria. Musculoskeletal: Negative lower extremity swelling Skin: Negative for rash. Neurological: Negative for severe headaches, focal weakness or numbness.   ____________________________________________   PHYSICAL EXAM:  VITAL SIGNS: ED Triage Vitals [06/05/18 1438]  Enc Vitals Group     BP 115/75     Pulse Rate 66     Resp 20     Temp 98.1 F (36.7 C)     Temp Source Oral     SpO2 97 %     Weight 200 lb (90.7 kg)     Height _0  (1.803 m)     Head Circumference      Peak Flow      Pain Score 8     Pain Loc      Pain Edu?      Excl. in [place]?     Constitutional: Alert and oriented. Well appearing and in no acute distress. Eyes: Conjunctivae are normal Head: Atraumatic Musculoskeletal: No lower extremity tenderness, no upper extremity tenderness. No joint effusions, no DVT right foot is surgically missing, left foot shows recent surgery sutures are in place, there is faint but palpable distal pulses, the foot is warm but not hot there is no erythema there is no purulence, there is no bleeding, looks like a good normal postoperative foot for him Neurologic:  Normal speech and language. No gross focal neurologic deficits are appreciated.  Skin:  Skin is warm, dry and intact. No rash noted. Psychiatric: Mood and affect are normal. Speech and behavior are normal.  ____________________________________________   LABS (all labs ordered are listed, but only abnormal results are displayed)  Labs Reviewed - No data to display  Pertinent labs  results that were available during my  care of the patient were reviewed by me and considered in my medical decision making (see chart for details). ____________________________________________  EKG  I personally interpreted any EKGs ordered by me or triage  ____________________________________________  RADIOLOGY  Pertinent labs & imaging results that were available during my care of the patient were reviewed  by me and considered in my medical decision making (see chart for details). If possible, patient and/or family made aware of any abnormal findings.  No results found. ____________________________________________    PROCEDURES  Procedure(s) performed: None  Procedures  Critical Care performed: None  ____________________________________________   INITIAL IMPRESSION / ASSESSMENT AND PLAN / ED COURSE  Pertinent labs & imaging results that were available during my care of the patient were reviewed by me and considered in my medical decision making (see chart for details).  Patient here for postop from his recent foot surgery, apparently had some blood on the tissue is not actively bleeding at this time no evidence of significant bleeding no evidence of infection no evidence of ischemia, we will discharge the patient with close outpatient follow-up, we will give him a Percocet here, he does also have Percocet to take at home later.    ____________________________________________   FINAL CLINICAL IMPRESSION(S) / ED DIAGNOSES  Final diagnoses:  Post-op pain      This chart was dictated using voice recognition software.  Despite best efforts to proofread,  errors can occur which can change meaning.      Schuyler Amor, MD 06/05/18 1550

## 2018-06-05 NOTE — ED Notes (Signed)
See triage note. Pt c/o increased pain in right foot. Pulses present but weak. Some redness just proximal to sutures on top of foot. No warmth noted by this RN. Pt reports taking oxycodone approx 1pm today.

## 2018-06-05 NOTE — ED Notes (Signed)
Attempted to get in touch with [facility] x3. Unsuccessful.

## 2018-06-05 NOTE — ED Notes (Signed)
Called [facility] for transport to [place]

## 2018-06-07 ENCOUNTER — Telehealth: Payer: Self-pay | Admitting: Family Medicine

## 2018-06-07 NOTE — Telephone Encounter (Signed)
Form received and placed on Dr. Maralyn Sago desk.

## 2018-06-07 NOTE — Telephone Encounter (Signed)
Received [employer] form. Placed form in providers box. Thanks CC

## 2018-06-16 ENCOUNTER — Telehealth: Payer: Self-pay | Admitting: Family Medicine

## 2018-06-16 NOTE — Telephone Encounter (Signed)
I don't understand this message. I don't have the FMLA forms to review.

## 2018-06-16 NOTE — Telephone Encounter (Signed)
Please advise 

## 2018-06-16 NOTE — Telephone Encounter (Signed)
[name] calling regarding FMLA paperwork,  Incapacity part C3A.  Was added to paperwork not initials added and need to know if all dx are related.    Paperwork will not be able to be completed until clarification.

## 2018-06-20 NOTE — Telephone Encounter (Signed)
[name] called to follow up on FMLA paper work.

## 2018-06-20 NOTE — Telephone Encounter (Signed)
Pt returned call  Thanks  teri

## 2018-06-20 NOTE — Telephone Encounter (Signed)
Per Sharyn Lull, Dr. Caryn Section already filled her FMLA forms out several weeks ago and they were faxed at that time. Today we received FMLA forms through fax for patients wife [name] on 06/15/2018. I tried calling [name] to clarify what changes needed to be made on these forms. I spoke with Dr. Caryn Section, who has a paper that list the changes that are needed for the FMLA forms. Forms have been given to Dr. Caryn Section to make changes.

## 2018-06-21 NOTE — Telephone Encounter (Signed)
Forms completed

## 2018-06-22 HISTORY — PX: LEG AMPUTATION THROUGH LOWER TIBIA AND FIBULA: SHX697

## 2018-06-28 DIAGNOSIS — I739 Peripheral vascular disease, unspecified: Secondary | ICD-10-CM | POA: Insufficient documentation

## 2018-06-28 MED ORDER — PARICALCITOL 2 MCG/ML IV SOLN
1.00 | INTRAVENOUS | Status: DC
Start: 2018-06-30 — End: 2018-06-28

## 2018-06-28 MED ORDER — LEVETIRACETAM 500 MG PO TABS
750.00 | ORAL_TABLET | ORAL | Status: DC
Start: 2018-06-28 — End: 2018-06-28

## 2018-06-28 MED ORDER — ATORVASTATIN CALCIUM 80 MG PO TABS
80.00 | ORAL_TABLET | ORAL | Status: DC
Start: 2018-06-28 — End: 2018-06-28

## 2018-06-28 MED ORDER — GLYCERIN (ADULT) 2 G RE SUPP
1.00 | RECTAL | Status: DC
Start: ? — End: 2018-06-28

## 2018-06-28 MED ORDER — POLYETHYLENE GLYCOL 3350 17 G PO PACK
17.00 | PACK | ORAL | Status: DC
Start: 2018-06-29 — End: 2018-06-28

## 2018-06-28 MED ORDER — AMLODIPINE BESYLATE 10 MG PO TABS
10.00 | ORAL_TABLET | ORAL | Status: DC
Start: 2018-06-29 — End: 2018-06-28

## 2018-06-28 MED ORDER — GENERIC EXTERNAL MEDICATION
5.00 | Status: DC
Start: ? — End: 2018-06-28

## 2018-06-28 MED ORDER — LISINOPRIL 10 MG PO TABS
10.00 | ORAL_TABLET | ORAL | Status: DC
Start: 2018-06-29 — End: 2018-06-28

## 2018-06-28 MED ORDER — HEPARIN SODIUM (PORCINE) 5000 UNIT/ML IJ SOLN
5000.00 | INTRAMUSCULAR | Status: DC
Start: 2018-06-28 — End: 2018-06-28

## 2018-06-28 MED ORDER — DEXTROSE 10 % IV SOLN
12.50 | INTRAVENOUS | Status: DC
Start: ? — End: 2018-06-28

## 2018-06-28 MED ORDER — INSULIN LISPRO 100 UNIT/ML ~~LOC~~ SOLN
0.00 | SUBCUTANEOUS | Status: DC
Start: 2018-06-28 — End: 2018-06-28

## 2018-06-28 MED ORDER — GENERIC EXTERNAL MEDICATION
16000.00 | Status: DC
Start: ? — End: 2018-06-28

## 2018-06-28 MED ORDER — INSULIN GLARGINE 100 UNIT/ML ~~LOC~~ SOLN
10.00 | SUBCUTANEOUS | Status: DC
Start: 2018-06-28 — End: 2018-06-28

## 2018-06-28 MED ORDER — DOCUSATE SODIUM 100 MG PO CAPS
100.00 | ORAL_CAPSULE | ORAL | Status: DC
Start: 2018-06-29 — End: 2018-06-28

## 2018-06-28 MED ORDER — HEPARIN SODIUM (PORCINE) 1000 UNIT/ML IJ SOLN
1000.00 | INTRAMUSCULAR | Status: DC
Start: ? — End: 2018-06-28

## 2018-06-28 MED ORDER — ACETAMINOPHEN 325 MG PO TABS
325.00 | ORAL_TABLET | ORAL | Status: DC
Start: ? — End: 2018-06-28

## 2018-06-28 MED ORDER — GABAPENTIN 300 MG PO CAPS
300.00 | ORAL_CAPSULE | ORAL | Status: DC
Start: 2018-06-28 — End: 2018-06-28

## 2018-06-28 MED ORDER — ASPIRIN 81 MG PO CHEW
81.00 | CHEWABLE_TABLET | ORAL | Status: DC
Start: 2018-06-29 — End: 2018-06-28

## 2018-07-25 ENCOUNTER — Encounter: Payer: Self-pay | Admitting: Family Medicine

## 2018-07-25 ENCOUNTER — Ambulatory Visit (INDEPENDENT_AMBULATORY_CARE_PROVIDER_SITE_OTHER): Payer: BLUE CROSS/BLUE SHIELD | Admitting: Family Medicine

## 2018-07-25 VITALS — BP 124/64 | HR 68 | Temp 98.4°F | Resp 18

## 2018-07-25 DIAGNOSIS — Z794 Long term (current) use of insulin: Secondary | ICD-10-CM

## 2018-07-25 DIAGNOSIS — M79645 Pain in left finger(s): Secondary | ICD-10-CM

## 2018-07-25 DIAGNOSIS — E1129 Type 2 diabetes mellitus with other diabetic kidney complication: Secondary | ICD-10-CM

## 2018-07-25 DIAGNOSIS — Z89512 Acquired absence of left leg below knee: Secondary | ICD-10-CM

## 2018-07-25 DIAGNOSIS — Z89511 Acquired absence of right leg below knee: Secondary | ICD-10-CM

## 2018-07-25 LAB — POCT GLYCOSYLATED HEMOGLOBIN (HGB A1C): Hemoglobin A1C: 6.3 % — AB (ref 4.0–5.6)

## 2018-07-25 MED ORDER — SULFAMETHOXAZOLE-TRIMETHOPRIM 800-160 MG PO TABS: 1.0000 | ORAL_TABLET | Freq: Two times a day (BID) | ORAL | 0 refills | Status: AC

## 2018-07-25 MED ORDER — INSULIN GLARGINE 100 UNIT/ML ~~LOC~~ SOLN: SUBCUTANEOUS | 5 refills | Status: DC

## 2018-07-25 NOTE — Progress Notes (Signed)
Patient: [name] Male    DOB: 06/05/1961   57 y.o.   MRN: [id number] Visit Date: 07/25/2018  Today's Provider: [name], MD   Chief Complaint  Patient presents with  . Hospitalization Follow-up   Subjective:    HPI  Follow up Hospitalization  Patient was admitted to [facility] on 06/22/2018 and discharged on 06/28/2018 to [facility]. He was discharged from [facility] on 07-22-2018 He was treated for Peripheral vascular disease and Ischemic foot pain at rest. Treatment for this included amputation of left lower leg, started Antibiotic . Telephone follow up was not done He reports good compliance with treatment. He reports this condition is Improved. He was having severe LLE pain and states is having almost not pain since amputation. He is anticipating getting prosthesis for both lower legs in the coming weeks.  Home PT is supposed to be contacting him soon to begin services.    He also reports he smashed his left third finger in his wheelchair several weeks ago and is still sore and painful.   ------------------------------------------------------------------------------------  Follow up DM Lab Results  Component Value Date   HGBA1C 7.0 01/31/2018   He states he takes Lantus on SSI between 7 and 17 units every nigh Will skip it if sugar is in the 80s or lower.  Denies any hypoglycemia.   Allergies  Allergen Reactions  . Vancomycin Shortness Of Breath    Eyes watering, SOB, wheezing  . Cefepime Other (See Comments)  . Methotrexate Other (See Comments)    Blood count drops  . Tape      Current Outpatient Medications:  .  Alcohol Swabs PADS, Use as directed to check blood sugar three times daily for insulin dependent type 2 diabetes., Disp: 100 each, Rfl: 12 .  amLODipine (NORVASC) 10 MG tablet, TAKE 1 TABLET BY MOUTH ONCE DAILY AS NEEDED, Disp: 30 tablet, Rfl: 12 .  aspirin EC 81 MG tablet, Take 81 mg by mouth daily., Disp: , Rfl:  .  atorvastatin  (LIPITOR) 80 MG tablet, Take 1 tablet (80 mg total) by mouth daily., Disp: 90 tablet, Rfl: 3 .  Blood Glucose Monitoring Suppl (ONE TOUCH ULTRA 2) w/Device KIT, Use as directed to check blood sugar three times daily. E11.9, Disp: 1 each, Rfl: 0 .  calcitRIOL (ROCALTROL) 0.25 MCG capsule, Take by mouth., Disp: , Rfl:  .  carvedilol (COREG) 25 MG tablet, Take 12.5 mg by mouth 2 (two) times daily. , Disp: , Rfl: 3 .  clopidogrel (PLAVIX) 75 MG tablet, Take 1 tablet (75 mg total) by mouth daily., Disp: 30 tablet, Rfl: 11 .  Continuous Blood Gluc Receiver (FREESTYLE LIBRE READER) DEVI, 1 Units by Does not apply route daily. USE TO CHECK BLOOD SUGAR UP TO FOUR TIMES A DAY AS DIRECTED. DX INSULIN DEPENDENT DIABETES, Disp: 1 Device, Rfl: 1 .  Continuous Blood Gluc Sensor ([place]) MISC, Use to check blood sugar four times daily for insulin dependent diabetes., Disp: 1 each, Rfl: 12 .  ferrous sulfate 325 (65 FE) MG tablet, Take 1 tablet by mouth daily., Disp: , Rfl:  .  furosemide (LASIX) 80 MG tablet, Take 1 tablet (80 mg total) by mouth 2 (two) times daily., Disp: 60 tablet, Rfl: 4 .  glucose blood (ONE TOUCH ULTRA TEST) test strip, Use as directed to check blood sugar three times daily. E11.9, Disp: 100 each, Rfl: 12 .  inFLIXimab (REMICADE) 100 MG injection, Inject into  the vein every 6 (six) weeks. , Disp: , Rfl:  .  insulin glargine (LANTUS) 100 UNIT/ML injection, Inject as directed by physician up to 20 units daily (Patient taking differently: Inject as directed by physician up to 20 units daily. Use a sliding scale), Disp: 10 mL, Rfl: 5 .  Insulin Pen Needle 32G X 6 MM MISC, Use to inject insulin via pen up to three times daily for insulin dependent type 2 diabetes., Disp: 100 each, Rfl: 12 .  levETIRAcetam (KEPPRA) 1000 MG tablet, Take 1,000 mg by mouth at bedtime. , Disp: , Rfl: 0 .  lisinopril (PRINIVIL,ZESTRIL) 10 MG tablet, Take 1 tablet by mouth daily. Tuesday, Thursday,  Saturday, and Sunday- Non Dialysis days, Disp: , Rfl: 1 .  mirtazapine (REMERON) 15 MG tablet, Take 7.5 mg by mouth at bedtime., Disp: , Rfl:  .  NOVOLOG FLEXPEN 100 UNIT/ML FlexPen, INJECT 15 UNITS SUBCUTANEOUSLY THREE TIMES DAILY (Patient taking differently: 4 units before meals), Disp: 15 mL, Rfl: 3 .  ONE TOUCH LANCETS MISC, Use as directed to check blood sugar three times daily. E11.9, Disp: 100 each, Rfl: 11 .  oxyCODONE (OXY IR/ROXICODONE) 5 MG immediate release tablet, Take by mouth., Disp: , Rfl:  .  polyethylene glycol (MIRALAX / GLYCOLAX) packet, Take 1 packet by mouth as needed., Disp: , Rfl: 0 .  sevelamer carbonate (RENVELA) 800 MG tablet, TAKE 1 TABLET BY MOUTH THREE TIMES DAILY, Disp: 270 tablet, Rfl: 3 .  sodium bicarbonate 650 MG tablet, Take 650 mg by mouth 2 (two) times daily., Disp: , Rfl:  .  triamcinolone ointment (KENALOG) 0.1 %, Apply 1 application topically as needed. , Disp: , Rfl: 0  Review of Systems  Constitutional: Negative.   Respiratory: Negative.   Cardiovascular: Negative.   Endocrine: Negative.   Musculoskeletal: Negative.     Social History   Tobacco Use  . Smoking status: Never Smoker  . Smokeless tobacco: Never Used  Substance Use Topics  . Alcohol use: No   Objective:   BP 124/64 (BP Location: Right Arm, Patient Position: Sitting, Cuff Size: Normal)   Pulse 68   Temp 98.4 F (36.9 C) (Oral)   Resp 18   SpO2 99%  Vitals:   07/25/18 1101  BP: 124/64  Pulse: 68  Resp: 18  Temp: 98.4 F (36.9 C)  TempSrc: Oral  SpO2: 99%     Physical Exam  General Appearance:    Alert, cooperative, no distress, obese  Eyes:    PERRL, conjunctiva/corneas clear, EOM's intact       Lungs:     Clear to auscultation bilaterally, respirations unlabored  Heart:    Regular rate and rhythm  Ext::   Left distal third digit mild swollen and tender with badly damaged nail. Faint erythema. No discharge.   Left stump minimally swollen, no erythema. No  discharge. Healing well.       Results for orders placed or performed in visit on 07/25/18  POCT HgB A1C  Result Value Ref Range   Hemoglobin A1C 6.3 (A) 4.0 - 5.6 %   HbA1c POC (<> result, manual entry)     HbA1c, POC (prediabetic range)     HbA1c, POC (controlled diabetic range)          Assessment & Plan:     1. Type 2 diabetes mellitus with other diabetic kidney complication, with long-term current use of insulin (HCC) Well controlled.  Continue current medications.   - insulin glargine (LANTUS) 100 UNIT/ML  injection; Inject as directed by physician up to 20 units daily  Dispense: 10 mL; Refill: 5 - POCT HgB A1C  2. S/P BKA (below knee amputation) bilateral (HCC) Doing well 1 month post op for left BKA. Continue PT and he is going to be working on getting bilateral prosthesis.   3. Finger pain, left Crushing injury over a month old. Is to soak finger in epsom salts and start- sulfamethoxazole-trimethoprim (BACTRIM DS,SEPTRA DS) 800-160 MG tablet; Take 1 tablet by mouth 2 (two) times daily for 14 days.  Dispense: 28 tablet; Refill: 0  Call for dermatology referral if not greatly improved in 1-2 weeks.       [name], MD  [facility]

## 2018-08-03 ENCOUNTER — Telehealth: Payer: Self-pay | Admitting: Family Medicine

## 2018-08-03 NOTE — Telephone Encounter (Signed)
[name] with [facility] is requesting verbal orders for in home physical therapy as follows:  Twice a week for 4 weeks  [name] stated if not able to answer please leave orders on voicemail. Please advise. Thanks TNP

## 2018-08-03 NOTE — Telephone Encounter (Signed)
[name] with [facility] is requesting verbal orders for in home occupational therapy as follows:  Once a week for 1 week Twice a week for 2 weeks  Please advise. Thanks TNP

## 2018-08-03 NOTE — Telephone Encounter (Signed)
Please advise 

## 2018-08-03 NOTE — Telephone Encounter (Signed)
That's fine

## 2018-08-04 ENCOUNTER — Telehealth: Payer: Self-pay | Admitting: Family Medicine

## 2018-08-04 NOTE — Telephone Encounter (Signed)
That's fine

## 2018-08-04 NOTE — Telephone Encounter (Signed)
Advised and L/M as below.

## 2018-08-04 NOTE — Telephone Encounter (Signed)
[place] w/ [place] [phone]   Needing Verbal Orders:  For skilled nursing services for 60 days.    Thanks, [employer]

## 2018-08-04 NOTE — Telephone Encounter (Signed)
Advised 

## 2018-08-07 ENCOUNTER — Other Ambulatory Visit: Payer: Self-pay | Admitting: Family Medicine

## 2018-08-07 DIAGNOSIS — Z8673 Personal history of transient ischemic attack (TIA), and cerebral infarction without residual deficits: Secondary | ICD-10-CM

## 2018-08-07 NOTE — Telephone Encounter (Signed)
Left message advising [facility] home [facility].  Thanks,   -Mickel Baas

## 2018-08-07 NOTE — Telephone Encounter (Signed)
That's fine

## 2018-08-11 ENCOUNTER — Other Ambulatory Visit
Admission: RE | Admit: 2018-08-11 | Discharge: 2018-08-11 | Disposition: A | Discharge status: Home / Self Care | Payer: BLUE CROSS/BLUE SHIELD | Source: Ambulatory Visit | Attending: Nephrology | Admitting: Nephrology

## 2018-08-11 DIAGNOSIS — N186 End stage renal disease: Secondary | ICD-10-CM | POA: Diagnosis not present

## 2018-08-11 LAB — POTASSIUM: Potassium: 3.7 mmol/L (ref 3.5–5.1)

## 2018-08-22 ENCOUNTER — Other Ambulatory Visit: Payer: Self-pay | Admitting: Family Medicine

## 2018-08-22 MED ORDER — GABAPENTIN 100 MG PO CAPS: 100.0000 mg | ORAL_CAPSULE | Freq: Two times a day (BID) | ORAL | 5 refills | Status: DC

## 2018-08-22 NOTE — Progress Notes (Signed)
Request from [facility] [address] via fax for gabapentin 171m cap bid per discharge from [facility].

## 2018-08-25 ENCOUNTER — Other Ambulatory Visit: Payer: Self-pay | Admitting: Family Medicine

## 2018-08-25 MED ORDER — FUROSEMIDE 80 MG PO TABS: 80.0000 mg | ORAL_TABLET | Freq: Two times a day (BID) | ORAL | 4 refills | Status: DC

## 2018-08-25 MED ORDER — LISINOPRIL 10 MG PO TABS: 10.0000 mg | ORAL_TABLET | Freq: Every day | ORAL | 5 refills | Status: DC

## 2018-08-25 NOTE — Telephone Encounter (Signed)
Please review. Thanks!  

## 2018-08-25 NOTE — Telephone Encounter (Signed)
[facility] faxed refill request for the following medications:  furosemide (LASIX) 80 MG tablet  Qty: 60  Please advise.

## 2018-08-25 NOTE — Telephone Encounter (Signed)
[facility] faxed refill request for the following medications:  insulin glargine (LANTUS) 100 UNIT/ML injection  Last fill date: 07/25/2018  lisinopril (PRINIVIL,ZESTRIL) 10 MG tablet   Please advise.

## 2018-08-31 ENCOUNTER — Telehealth: Payer: Self-pay | Admitting: Family Medicine

## 2018-08-31 NOTE — Telephone Encounter (Signed)
Please advise 

## 2018-08-31 NOTE — Telephone Encounter (Signed)
That's fine

## 2018-08-31 NOTE — Telephone Encounter (Signed)
[name], [place] w/ [facility] [phone]  OT Verbal Orders for:   2 times a week for 3 weeks 1 time a week for 1 week  Thanks, [place]

## 2018-09-01 NOTE — Telephone Encounter (Signed)
Tried calling, unable to leave a message.  Mailbox is full.  Thanks,   -Mickel Baas

## 2018-09-01 NOTE — Telephone Encounter (Signed)
Tried calling; [facility] is full.   Thanks,   -Mickel Baas

## 2018-09-04 NOTE — Telephone Encounter (Signed)
Left detailed message on [name]'s vm giving verbal ok.

## 2018-09-11 ENCOUNTER — Other Ambulatory Visit: Payer: Self-pay | Admitting: Family Medicine

## 2018-09-11 DIAGNOSIS — Z992 Dependence on renal dialysis: Principal | ICD-10-CM

## 2018-09-11 DIAGNOSIS — N186 End stage renal disease: Secondary | ICD-10-CM

## 2018-09-11 NOTE — Telephone Encounter (Signed)
[facility] faxed refill request for the following medications:  sevelamer carbonate (RENVELA) 800 MG tablet   Please advise.

## 2018-09-12 MED ORDER — SEVELAMER CARBONATE 800 MG PO TABS: 800.0000 mg | ORAL_TABLET | Freq: Three times a day (TID) | ORAL | 3 refills | Status: DC

## 2018-09-27 ENCOUNTER — Telehealth: Payer: Self-pay

## 2018-09-27 NOTE — Telephone Encounter (Signed)
Opened in error

## 2018-11-28 ENCOUNTER — Ambulatory Visit: Payer: BLUE CROSS/BLUE SHIELD | Admitting: Family Medicine

## 2018-12-25 ENCOUNTER — Other Ambulatory Visit: Payer: Self-pay | Admitting: Family Medicine

## 2018-12-25 DIAGNOSIS — E1129 Type 2 diabetes mellitus with other diabetic kidney complication: Secondary | ICD-10-CM

## 2018-12-25 DIAGNOSIS — Z794 Long term (current) use of insulin: Principal | ICD-10-CM

## 2018-12-25 MED ORDER — INSULIN GLARGINE 100 UNIT/ML ~~LOC~~ SOLN: SUBCUTANEOUS | 5 refills | Status: DC

## 2018-12-25 NOTE — Telephone Encounter (Signed)
Patient is requesting a refill on Lantus pen. Is the medication in "orders"  For a pen or vial? Patient prefers the Lantus pen. Please advise.

## 2018-12-25 NOTE — Telephone Encounter (Signed)
Patient needs refill on Lantus pen sent to [facility]

## 2019-01-05 ENCOUNTER — Other Ambulatory Visit: Payer: Self-pay

## 2019-01-05 ENCOUNTER — Other Ambulatory Visit
Admission: RE | Admit: 2019-01-05 | Discharge: 2019-01-05 | Disposition: A | Discharge status: Home / Self Care | Payer: Medicare Other | Source: Ambulatory Visit | Attending: Nephrology | Admitting: Nephrology

## 2019-01-05 ENCOUNTER — Inpatient Hospital Stay
Admission: EM | Admit: 2019-01-05 | Discharge: 2019-01-08 | DRG: 811 | Disposition: A | Discharge status: Home / Self Care | Payer: Medicare Other | Source: Ambulatory Visit | Attending: Internal Medicine | Admitting: Internal Medicine

## 2019-01-05 DIAGNOSIS — Z9862 Peripheral vascular angioplasty status: Secondary | ICD-10-CM | POA: Diagnosis not present

## 2019-01-05 DIAGNOSIS — D509 Iron deficiency anemia, unspecified: Secondary | ICD-10-CM | POA: Diagnosis present

## 2019-01-05 DIAGNOSIS — Z89511 Acquired absence of right leg below knee: Secondary | ICD-10-CM

## 2019-01-05 DIAGNOSIS — Z8719 Personal history of other diseases of the digestive system: Secondary | ICD-10-CM | POA: Diagnosis not present

## 2019-01-05 DIAGNOSIS — N2581 Secondary hyperparathyroidism of renal origin: Secondary | ICD-10-CM | POA: Diagnosis present

## 2019-01-05 DIAGNOSIS — D62 Acute posthemorrhagic anemia: Principal | ICD-10-CM | POA: Diagnosis present

## 2019-01-05 DIAGNOSIS — Z8249 Family history of ischemic heart disease and other diseases of the circulatory system: Secondary | ICD-10-CM | POA: Diagnosis not present

## 2019-01-05 DIAGNOSIS — Z794 Long term (current) use of insulin: Secondary | ICD-10-CM | POA: Diagnosis not present

## 2019-01-05 DIAGNOSIS — Z992 Dependence on renal dialysis: Secondary | ICD-10-CM | POA: Diagnosis not present

## 2019-01-05 DIAGNOSIS — N186 End stage renal disease: Secondary | ICD-10-CM | POA: Insufficient documentation

## 2019-01-05 DIAGNOSIS — Z7902 Long term (current) use of antithrombotics/antiplatelets: Secondary | ICD-10-CM

## 2019-01-05 DIAGNOSIS — G40909 Epilepsy, unspecified, not intractable, without status epilepticus: Secondary | ICD-10-CM | POA: Diagnosis present

## 2019-01-05 DIAGNOSIS — Z833 Family history of diabetes mellitus: Secondary | ICD-10-CM

## 2019-01-05 DIAGNOSIS — E7800 Pure hypercholesterolemia, unspecified: Secondary | ICD-10-CM | POA: Diagnosis present

## 2019-01-05 DIAGNOSIS — K5090 Crohn's disease, unspecified, without complications: Secondary | ICD-10-CM | POA: Diagnosis present

## 2019-01-05 DIAGNOSIS — I120 Hypertensive chronic kidney disease with stage 5 chronic kidney disease or end stage renal disease: Secondary | ICD-10-CM | POA: Diagnosis present

## 2019-01-05 DIAGNOSIS — D631 Anemia in chronic kidney disease: Secondary | ICD-10-CM | POA: Diagnosis present

## 2019-01-05 DIAGNOSIS — Z881 Allergy status to other antibiotic agents status: Secondary | ICD-10-CM | POA: Diagnosis not present

## 2019-01-05 DIAGNOSIS — Z79899 Other long term (current) drug therapy: Secondary | ICD-10-CM | POA: Diagnosis not present

## 2019-01-05 DIAGNOSIS — D649 Anemia, unspecified: Secondary | ICD-10-CM

## 2019-01-05 DIAGNOSIS — Z8673 Personal history of transient ischemic attack (TIA), and cerebral infarction without residual deficits: Secondary | ICD-10-CM

## 2019-01-05 DIAGNOSIS — Z8261 Family history of arthritis: Secondary | ICD-10-CM | POA: Diagnosis not present

## 2019-01-05 DIAGNOSIS — I739 Peripheral vascular disease, unspecified: Secondary | ICD-10-CM | POA: Diagnosis present

## 2019-01-05 DIAGNOSIS — E876 Hypokalemia: Secondary | ICD-10-CM | POA: Diagnosis present

## 2019-01-05 DIAGNOSIS — Z7982 Long term (current) use of aspirin: Secondary | ICD-10-CM | POA: Diagnosis not present

## 2019-01-05 DIAGNOSIS — Z888 Allergy status to other drugs, medicaments and biological substances status: Secondary | ICD-10-CM

## 2019-01-05 DIAGNOSIS — E1122 Type 2 diabetes mellitus with diabetic chronic kidney disease: Secondary | ICD-10-CM | POA: Diagnosis present

## 2019-01-05 DIAGNOSIS — Z8379 Family history of other diseases of the digestive system: Secondary | ICD-10-CM

## 2019-01-05 DIAGNOSIS — Z792 Long term (current) use of antibiotics: Secondary | ICD-10-CM

## 2019-01-05 DIAGNOSIS — K921 Melena: Secondary | ICD-10-CM

## 2019-01-05 DIAGNOSIS — Z86718 Personal history of other venous thrombosis and embolism: Secondary | ICD-10-CM

## 2019-01-05 LAB — COMPREHENSIVE METABOLIC PANEL
ALT: 10 U/L (ref 0–44)
AST: 12 U/L — ABNORMAL LOW (ref 15–41)
Albumin: 2.8 g/dL — ABNORMAL LOW (ref 3.5–5.0)
Alkaline Phosphatase: 86 U/L (ref 38–126)
Anion gap: 9 (ref 5–15)
BUN: 14 mg/dL (ref 6–20)
CO2: 29 mmol/L (ref 22–32)
Calcium: 8.2 mg/dL — ABNORMAL LOW (ref 8.9–10.3)
Chloride: 98 mmol/L (ref 98–111)
Creatinine, Ser: 2.61 mg/dL — ABNORMAL HIGH (ref 0.61–1.24)
GFR calc Af Amer: 30 mL/min — ABNORMAL LOW (ref >60)
GFR calc non Af Amer: 26 mL/min — ABNORMAL LOW (ref >60)
Glucose, Bld: 175 mg/dL — ABNORMAL HIGH (ref 70–99)
Potassium: 3.2 mmol/L — ABNORMAL LOW (ref 3.5–5.1)
Sodium: 136 mmol/L (ref 135–145)
Total Bilirubin: 0.3 mg/dL (ref 0.3–1.2)
Total Protein: 7.6 g/dL (ref 6.5–8.1)

## 2019-01-05 LAB — CBC
HCT: 17.6 % — ABNORMAL LOW (ref 39.0–52.0)
Hemoglobin: 5.4 g/dL — ABNORMAL LOW (ref 13.0–17.0)
MCH: 29.2 pg (ref 26.0–34.0)
MCHC: 30.7 g/dL (ref 30.0–36.0)
MCV: 95.1 fL (ref 80.0–100.0)
Platelets: 304 10*3/uL (ref 150–400)
RBC: 1.85 MIL/uL — ABNORMAL LOW (ref 4.22–5.81)
RDW: 15.9 % — ABNORMAL HIGH (ref 11.5–15.5)
WBC: 8 10*3/uL (ref 4.0–10.5)
nRBC: 0 % (ref 0.0–0.2)

## 2019-01-05 LAB — TROPONIN I: Troponin I: <0.03 ng/mL (ref <0.03)

## 2019-01-05 LAB — IRON AND TIBC
Iron: 30 ug/dL — ABNORMAL LOW (ref 45–182)
Saturation Ratios: 19 % (ref 17.9–39.5)
TIBC: 157 ug/dL — ABNORMAL LOW (ref 250–450)
UIBC: 127 ug/dL

## 2019-01-05 LAB — HEMOGLOBIN AND HEMATOCRIT, BLOOD
HCT: 15.3 % — ABNORMAL LOW (ref 39.0–52.0)
Hemoglobin: 4.8 g/dL — CL (ref 13.0–17.0)

## 2019-01-05 LAB — PROTIME-INR
INR: 1 (ref 0.8–1.2)
Prothrombin Time: 13 seconds (ref 11.4–15.2)

## 2019-01-05 LAB — PREPARE RBC (CROSSMATCH)

## 2019-01-05 LAB — APTT: aPTT: 36 seconds (ref 24–36)

## 2019-01-05 LAB — HEMOGLOBIN: Hemoglobin: 5.8 g/dL — ABNORMAL LOW (ref 13.0–17.0)

## 2019-01-05 MED ORDER — PANTOPRAZOLE SODIUM 40 MG IV SOLR: 40.0000 mg | Freq: Two times a day (BID) | INTRAVENOUS | Status: DC

## 2019-01-05 MED ORDER — LEVETIRACETAM 750 MG PO TABS
750.0000 mg | ORAL_TABLET | Freq: Every day | ORAL | Status: DC
  Administered 2019-01-05 – 2019-01-07 (×3): 750 mg via ORAL
  Filled 2019-01-05 (×4): qty 1

## 2019-01-05 MED ORDER — ATORVASTATIN CALCIUM 80 MG PO TABS
80.0000 mg | ORAL_TABLET | Freq: Every day | ORAL | Status: DC
  Administered 2019-01-05 – 2019-01-07 (×3): 80 mg via ORAL
  Filled 2019-01-05 (×2): qty 4
  Filled 2019-01-05 (×3): qty 1
  Filled 2019-01-05: qty 4
  Filled 2019-01-05: qty 1

## 2019-01-05 MED ORDER — AMLODIPINE BESYLATE 10 MG PO TABS: 10.0000 mg | ORAL_TABLET | Freq: Every day | ORAL | Status: DC | PRN

## 2019-01-05 MED ORDER — ACETAMINOPHEN 650 MG RE SUPP: 650.0000 mg | Freq: Four times a day (QID) | RECTAL | Status: DC | PRN

## 2019-01-05 MED ORDER — SODIUM CHLORIDE 0.9 % IV SOLN
10.0000 mL/h | Freq: Once | INTRAVENOUS | Status: AC
  Administered 2019-01-05: 10 mL/h via INTRAVENOUS

## 2019-01-05 MED ORDER — SODIUM CHLORIDE 0.9 % IV SOLN
80.0000 mg | Freq: Once | INTRAVENOUS | Status: DC
  Filled 2019-01-05: qty 80

## 2019-01-05 MED ORDER — ACETAMINOPHEN 325 MG PO TABS
650.0000 mg | ORAL_TABLET | Freq: Four times a day (QID) | ORAL | Status: DC | PRN
  Administered 2019-01-05: 650 mg via ORAL
  Filled 2019-01-05: qty 2

## 2019-01-05 MED ORDER — RIFAMPIN 300 MG PO CAPS
300.0000 mg | ORAL_CAPSULE | Freq: Two times a day (BID) | ORAL | Status: DC
  Administered 2019-01-05 – 2019-01-07 (×4): 300 mg via ORAL
  Filled 2019-01-05 (×7): qty 1

## 2019-01-05 MED ORDER — ONDANSETRON HCL 4 MG PO TABS: 4.0000 mg | ORAL_TABLET | Freq: Four times a day (QID) | ORAL | Status: DC | PRN

## 2019-01-05 MED ORDER — SEVELAMER CARBONATE 800 MG PO TABS
800.0000 mg | ORAL_TABLET | Freq: Three times a day (TID) | ORAL | Status: DC
  Administered 2019-01-06 – 2019-01-07 (×5): 800 mg via ORAL
  Filled 2019-01-05 (×5): qty 1

## 2019-01-05 MED ORDER — POLYETHYLENE GLYCOL 3350 17 G PO PACK: 17.0000 g | PACK | Freq: Every day | ORAL | Status: DC | PRN

## 2019-01-05 MED ORDER — FERRIC CITRATE 1 GM 210 MG(FE) PO TABS
420.0000 mg | ORAL_TABLET | Freq: Three times a day (TID) | ORAL | Status: DC
  Administered 2019-01-06 – 2019-01-07 (×5): 420 mg via ORAL
  Filled 2019-01-05 (×9): qty 2

## 2019-01-05 MED ORDER — INSULIN ASPART 100 UNIT/ML ~~LOC~~ SOLN
0.0000 [IU] | Freq: Three times a day (TID) | SUBCUTANEOUS | Status: DC
  Administered 2019-01-06: 1 [IU] via SUBCUTANEOUS
  Administered 2019-01-06: 2 [IU] via SUBCUTANEOUS
  Administered 2019-01-07: 1 [IU] via SUBCUTANEOUS
  Filled 2019-01-05 (×2): qty 1

## 2019-01-05 MED ORDER — SODIUM CHLORIDE 0.9 % IV SOLN
8.0000 mg/h | INTRAVENOUS | Status: DC
  Filled 2019-01-05: qty 80

## 2019-01-05 MED ORDER — ONDANSETRON HCL 4 MG/2ML IJ SOLN: 4.0000 mg | Freq: Four times a day (QID) | INTRAMUSCULAR | Status: DC | PRN

## 2019-01-05 MED ORDER — GABAPENTIN 100 MG PO CAPS
100.0000 mg | ORAL_CAPSULE | Freq: Two times a day (BID) | ORAL | Status: DC
  Administered 2019-01-05 – 2019-01-07 (×4): 100 mg via ORAL
  Filled 2019-01-05 (×4): qty 1

## 2019-01-05 MED ORDER — PANTOPRAZOLE SODIUM 40 MG IV SOLR
40.0000 mg | Freq: Two times a day (BID) | INTRAVENOUS | Status: DC
  Administered 2019-01-05 – 2019-01-07 (×5): 40 mg via INTRAVENOUS
  Filled 2019-01-05 (×5): qty 40

## 2019-01-05 NOTE — ED Provider Notes (Signed)
[facility] Emergency Department Provider Note  ____________________________________________  Time seen: Approximately 5:46 PM  I have reviewed the triage vital signs and the nursing notes.   HISTORY  Chief Complaint Anemia    HPI [name] is a 58 y.o. male with a history of ESRD on HD having completed dialysis today, Crohn's disease, presenting with melena and anemia.  The patient reports that for the past week he has been having black stool, no diarrhea.  No abdominal pain, nausea or vomiting.  He does not drink alcohol and has not had any epigastric pain.  He has noted exertional dyspnea without any lightheadedness or syncope.  He has not had any chest pain.  Today he was found to be profoundly anemic in the outpatient setting with a hemoglobin of 5.5 and was referred here for further evaluation and treatment.  Last colonoscopy is self-reported to be 2 to 3 years ago normal.  Past Medical History:  Diagnosis Date  . Anemia   . Crohn disease ([place])   . Diabetes mellitus without complication ([place])   . DVT of lower extremity (deep venous thrombosis) ([place]) 2016  . Hidradenitis suppurativa   . Hypertension   . ICH (intracerebral hemorrhage) ([place])   . Peritonitis ([place]) 04/21/2017  . Pyogenic arthritis of knee ([place]) 02/04/2016  . Renal disorder   . Sepsis ([place]) 01/12/2018    Patient Active Problem List   Diagnosis Date Noted  . Peripheral vascular disease ([place]) 06/28/2018  . Encephalopathy 12/04/2017  . Pressure injury of skin 12/04/2017  . S/P BKA (below knee amputation) unilateral, right ([place]) 10/20/2017  . Atherosclerosis of native arteries of extremity with rest pain ([place]) 08/05/2017  . Empyema ([place]) 05/20/2017  . History of CVA (cerebrovascular accident) 04/15/2017  . Seizure ([place]) 04/15/2017  . End stage renal failure on dialysis ([place]) 04/12/2016  . Aphthae 02/20/2016  . Hidradenitis 02/20/2016  . Leg pain 02/20/2016  . Neuropathy 02/20/2016   . Narrowing of intervertebral disc space 08/29/2015  . Vascular disorder of lower extremity 08/29/2015  . Failure of erection 08/29/2015  . Accumulation of fluid in tissues 08/29/2015  . Hypercholesteremia 08/29/2015  . Hypertension 08/29/2015  . Anemia due to chronic kidney disease 05/26/2015  . Venous insufficiency of leg 09/04/2014  . Deep vein thrombosis of lower extremity ([place]) 08/16/2014  . Prostatic intraepithelial neoplasia 11/02/2013  . Elevated prostate specific antigen (PSA) 09/11/2013  . Benign prostatic hyperplasia with urinary obstruction 08/13/2013  . Spermatocele 08/13/2013  . Avitaminosis D 01/25/2013  . Abnormal presence of protein in urine 03/28/2012  . Type 2 diabetes mellitus with kidney complication, with long-term current use of insulin ([place]) 03/28/2012  . Crohn's disease ([place]) 08/03/2011    Past Surgical History:  Procedure Laterality Date  . ABDOMINAL SURGERY    . ANGIOPLASTY Left    left fem-pop at [facility] 04-11-2018  . BELOW KNEE LEG AMPUTATION Right 08/2017   [facility]  . DIALYSIS/PERMA CATHETER INSERTION N/A 12/09/2017   Procedure: DIALYSIS/PERMA CATHETER INSERTION;  Surgeon: Katha Cabal, MD;  Location: [place] [facility];  Service: Cardiovascular;  Laterality: N/A;  . DIALYSIS/PERMA CATHETER INSERTION N/A 12/12/2017   Procedure: DIALYSIS/PERMA CATHETER INSERTION;  Surgeon: Algernon Huxley, MD;  Location: [place] [facility];  Service: Cardiovascular;  Laterality: N/A;  . DIALYSIS/PERMA CATHETER REMOVAL Left 12/09/2017   Procedure: DIALYSIS/PERMA CATHETER REMOVAL;  Surgeon: Katha Cabal, MD;  Location: [place] [facility];  Service: Cardiovascular;  Laterality: Left;  . KNEE SURGERY  Left 02/04/2016   [facility]  . LOWER EXTREMITY ANGIOGRAPHY Right 08/08/2017   Procedure: Lower Extremity Angiography;  Surgeon: Algernon Huxley, MD;  Location: [place] [facility];  Service: Cardiovascular;  Laterality: Right;  . LOWER EXTREMITY ANGIOGRAPHY Right 08/22/2017    Procedure: Lower Extremity Angiography;  Surgeon: Algernon Huxley, MD;  Location: [place] [facility];  Service: Cardiovascular;  Laterality: Right;  . LOWER EXTREMITY INTERVENTION  08/08/2017   Procedure: LOWER EXTREMITY INTERVENTION;  Surgeon: Algernon Huxley, MD;  Location: [place] [facility];  Service: Cardiovascular;;  . LOWER EXTREMITY INTERVENTION  08/22/2017   Procedure: LOWER EXTREMITY INTERVENTION;  Surgeon: Algernon Huxley, MD;  Location: [place] [facility];  Service: Cardiovascular;;    Current Outpatient Rx  . Order #: [id number] Class: Normal  . Order #: [id number] Class: Normal  . Order #: [id number] Class: Historical Med  . Order #: [id number] Class: Normal  . Order #: [id number] Class: Normal  . Order #: [id number] Class: Historical Med  . Order #: [id number] Class: Historical Med  . Order #: [id number] Class: Print  . Order #: [id number] Class: Normal  . Order #: [id number] Class: Normal  . Order #: [id number] Class: Historical Med  . Order #: [id number] Class: Normal  . Order #: [id number] Class: Normal  . Order #: [id number] Class: Normal  . Order #: [id number] Class: Historical Med  . Order #: [id number] Class: Normal  . Order #: [id number] Class: Normal  . Order #: [id number] Class: Historical Med  . Order #: [id number] Class: Normal  . Order #: [id number] Class: Historical Med  . Order #: [id number] Class: Normal  . Order #: [id number] Class: Normal  . Order #: [id number] Class: Historical Med  . Order #: [id number] Class: Historical Med  . Order #: [id number] Class: Normal  . Order #: [id number] Class: Historical Med  . Order #: [id number] Class: Historical Med    Allergies Vancomycin; Cefepime; Methotrexate; and Tape  Family History  Problem Relation Age of Onset  . Irritable bowel syndrome Sister   . Diabetes Sister   . Heart disease Mother   . Diabetes Mother   . Heart disease Father   . Rheumatic fever Father        as child  . Psoriasis Brother   . Arthritis Brother   . Diabetes Sister   .  Diabetes Sister     Social History Social History   Tobacco Use  . Smoking status: Never Smoker  . Smokeless tobacco: Never Used  Substance Use Topics  . Alcohol use: No  . Drug use: Yes    Types: Marijuana    Review of Systems Constitutional: No fever/chills.  Negative lightheadedness or syncope. Eyes: No visual changes. ENT: No sore throat. No congestion or rhinorrhea. Cardiovascular: Denies chest pain. Denies palpitations. Respiratory: Positive exertional shortness of breath.  No cough. Gastrointestinal: No abdominal pain.  No nausea, no vomiting.  No diarrhea.  No constipation.  Positive black stool. Genitourinary: Negative for dysuria. Musculoskeletal: Negative for back pain. Skin: Negative for rash. Neurological: Negative for headaches. No focal numbness, tingling or weakness.     ____________________________________________   PHYSICAL EXAM:  VITAL SIGNS: ED Triage Vitals  Enc Vitals Group     BP 01/05/19 1621 (!) 120/53     Pulse Rate 01/05/19 1619 81     Resp 01/05/19 1619 18     Temp 01/05/19 1619 98.7 F (37.1 C)     Temp Source 01/05/19 1619 Oral     SpO2 01/05/19 1619 99 %     Weight 01/05/19 1619 191 lb 12.8 oz (87 kg)  Height 01/05/19 1619 5\' 11"  (1.803 m)     Head Circumference --      [facility] --      Pain Score 01/05/19 1619 0     Pain Loc --      Pain Edu? --      Excl. in [place]? --     Constitutional: Alert and oriented.Answers questions appropriately.  Chronically ill-appearing Eyes: Conjunctivae are normal.  EOMI. No scleral icterus. Head: Atraumatic. Nose: No congestion/rhinnorhea. Mouth/Throat: Mucous membranes are moist.  Neck: No stridor.  Supple.  Mild JVD.  No meningismus. Cardiovascular: Normal rate, regular rhythm. No murmurs, rubs or gallops.  Respiratory: Normal respiratory effort.  No accessory muscle use or retractions. Lungs CTAB.  No wheezes, rales or ronchi. Gastrointestinal: Soft, nontender and nondistended.  No  guarding or rebound.  No peritoneal signs. GU: There is not a large amount of stool in the rectal vault but the small amount of stool that I have is brown and guaiac negative. Musculoskeletal: Right BKA with prosthetic in place. Neurologic:  A&Ox3.  Speech is clear.  Face and smile are symmetric.  EOMI.  Moves all extremities well. Skin:  Skin is warm, dry and intact. No rash noted. Psychiatric: Mood and affect are normal. Speech and behavior are normal.  Normal judgement.  ____________________________________________   LABS (all labs ordered are listed, but only abnormal results are displayed)  Labs Reviewed  COMPREHENSIVE METABOLIC PANEL - Abnormal; Notable for the following components:      Result Value   Potassium 3.2 (*)    Glucose, Bld 175 (*)    Creatinine, Ser 2.61 (*)    Calcium 8.2 (*)    Albumin 2.8 (*)    AST 12 (*)    GFR calc non Af Amer 26 (*)    GFR calc Af Amer 30 (*)    All other components within normal limits  CBC - Abnormal; Notable for the following components:   RBC 1.85 (*)    Hemoglobin 5.4 (*)    HCT 17.6 (*)    RDW 15.9 (*)    All other components within normal limits  PROTIME-INR  APTT  TYPE AND SCREEN  PREPARE RBC (CROSSMATCH)   ____________________________________________  EKG  ED ECG REPORT I, Anne-Caroline Mariea Clonts, the attending physician, personally viewed and interpreted this ECG.   Date: 01/05/2019  EKG Time: 1817  Rate: 65  Rhythm: normal sinus rhythm  Axis: normal  Intervals:none  ST&T Change: No STEMI  ____________________________________________  RADIOLOGY  No results found.  ____________________________________________   PROCEDURES  Procedure(s) performed: None  Procedures  Critical Care performed: No ____________________________________________   INITIAL IMPRESSION / ASSESSMENT AND PLAN / ED COURSE  Pertinent labs & imaging results that were available during my care of the patient were reviewed by me and  considered in my medical decision making (see chart for details).  58 y.o. male with a history of ESRD on HD last dialyzed today presenting with 1 week of black stool, exertional dyspnea, now found to have hemoglobin of 5.  Overall, I am concerned about a GI bleed.  The patient did not have a large amount of stool but I do not see evidence of bleeding.  Given melena, I would be more concerned about an upper GI bleed and Protonix has been ordered.  Anemia of chronic disease is also possible.  Certainly, he could be having a GI bleed from his Crohn's but his symptoms do not include any diarrhea or fever, no  abdominal pain, so an acute flare is also less likely.  The patient will be transfused; at this time I have ordered 1 unit of blood due to our blood bank shortage, and he will be reevaluated for additional blood administration by the admitting hospitalist.  Plan admission at this time.  ____________________________________________  FINAL CLINICAL IMPRESSION(S) / ED DIAGNOSES  Final diagnoses:  Black stool  Symptomatic anemia         NEW MEDICATIONS STARTED DURING THIS VISIT:  New Prescriptions   No medications on file      Eula Listen, MD 01/05/19 [phone]

## 2019-01-05 NOTE — ED Triage Notes (Signed)
Patient states he was at the dialysis clinic and was told his blood count was too low.  Patient states they said his hemoglobin was under 5.  Patient had a complete dialysis treatment today.  Patient is in no obvious distress at this time.

## 2019-01-05 NOTE — Progress Notes (Signed)
Family Meeting Note  Patient has history of end-stage renal disease on hemodialysis, hypertension, diabetes comes to the emergency room from dialysis after his hemoglobin was noted to be 5.0. Patient is currently getting one unit blood transfusion. He is being admitted for further evaluation. G.I. consultation has been placed. Patient otherwise is hemodynamically stable. Code status address patient is a full code  Time spent 16 minutes Fritzi Mandes, MD

## 2019-01-05 NOTE — H&P (Signed)
[name] [place] NAME: [name]    MR#:  [id number]  DATE OF BIRTH:  1960/10/21  DATE OF ADMISSION:  01/05/2019  PRIMARY CARE PHYSICIAN: [name], MD   REQUESTING/REFERRING PHYSICIAN: Dr. Mariea Clonts  CHIEF COMPLAINT:   Low hemoglobin  Dter. present in the ER HISTORY OF PRESENT ILLNESS:  [name]  is a 58 y.o. male with a known history of iron deficiency anemia, Crohn's disease, diabetes, end-stage renal disease on hemodialysis comes to the emergency room from dialysis after completion of with low hemoglobin. He was told his hemoglobin of 5.0  Patient denies any black colored stools any NSAIDs or any blood per rectum. Any acid reflux symptoms. He has had colonoscopy in the remote past colon polyps removed. Does not remember getting EGD done.  His labs from [facility] shows he is deficiency anemia on iron pills.  Heme occult stools was negative in the ER.  Patient currently is getting one unit of blood transfusion. G.I. consultation has been placed for Dr. Mcarthur Rossetti message sent  PAST MEDICAL HISTORY:   Past Medical History:  Diagnosis Date  . Anemia   . Crohn disease ([place])   . Diabetes mellitus without complication ([place])   . DVT of lower extremity (deep venous thrombosis) ([place]) 2016  . Hidradenitis suppurativa   . Hypertension   . ICH (intracerebral hemorrhage) ([place])   . Peritonitis ([place]) 04/21/2017  . Pyogenic arthritis of knee ([place]) 02/04/2016  . Renal disorder   . Sepsis ([place]) 01/12/2018    PAST SURGICAL HISTOIRY:   Past Surgical History:  Procedure Laterality Date  . ABDOMINAL SURGERY    . ANGIOPLASTY Left    left [facility]pop at [facility] 04-11-2018  . BELOW KNEE LEG AMPUTATION Right 08/2017   [facility]  . DIALYSIS/PERMA CATHETER INSERTION N/A 12/09/2017   Procedure: DIALYSIS/PERMA CATHETER INSERTION;  Surgeon: Katha Cabal, MD;  Location: [place] [facility];  Service: Cardiovascular;  Laterality: N/A;  .  DIALYSIS/PERMA CATHETER INSERTION N/A 12/12/2017   Procedure: DIALYSIS/PERMA CATHETER INSERTION;  Surgeon: Algernon Huxley, MD;  Location: [place] [facility];  Service: Cardiovascular;  Laterality: N/A;  . DIALYSIS/PERMA CATHETER REMOVAL Left 12/09/2017   Procedure: DIALYSIS/PERMA CATHETER REMOVAL;  Surgeon: Katha Cabal, MD;  Location: [place] [facility];  Service: Cardiovascular;  Laterality: Left;  . KNEE SURGERY Left 02/04/2016   [facility]  . LOWER EXTREMITY ANGIOGRAPHY Right 08/08/2017   Procedure: Lower Extremity Angiography;  Surgeon: Algernon Huxley, MD;  Location: [place] [facility];  Service: Cardiovascular;  Laterality: Right;  . LOWER EXTREMITY ANGIOGRAPHY Right 08/22/2017   Procedure: Lower Extremity Angiography;  Surgeon: Algernon Huxley, MD;  Location: [place] [facility];  Service: Cardiovascular;  Laterality: Right;  . LOWER EXTREMITY INTERVENTION  08/08/2017   Procedure: LOWER EXTREMITY INTERVENTION;  Surgeon: Algernon Huxley, MD;  Location: [place] [facility];  Service: Cardiovascular;;  . LOWER EXTREMITY INTERVENTION  08/22/2017   Procedure: LOWER EXTREMITY INTERVENTION;  Surgeon: Algernon Huxley, MD;  Location: [place] [facility];  Service: Cardiovascular;;    SOCIAL HISTORY:   Social History   Tobacco Use  . Smoking status: Never Smoker  . Smokeless tobacco: Never Used  Substance Use Topics  . Alcohol use: No    FAMILY HISTORY:   Family History  Problem Relation Age of Onset  . Irritable bowel syndrome Sister   . Diabetes Sister   . Heart disease Mother   .  Diabetes Mother   . Heart disease Father   . Rheumatic fever Father        as child  . Psoriasis Brother   . Arthritis Brother   . Diabetes Sister   . Diabetes Sister     DRUG ALLERGIES:   Allergies  Allergen Reactions  . Methotrexate Other (See Comments)    Blood count drops  . Vancomycin Shortness Of Breath    Eyes watering, SOB, wheezing  . Cefepime Other (See Comments)  . Tape     REVIEW OF  SYSTEMS:  Review of Systems  Constitutional: Negative for chills, fever and weight loss.  HENT: Negative for ear discharge, ear pain and nosebleeds.   Eyes: Negative for blurred vision, pain and discharge.  Respiratory: Negative for sputum production, shortness of breath, wheezing and stridor.   Cardiovascular: Negative for chest pain, palpitations, orthopnea and PND.  Gastrointestinal: Negative for abdominal pain, diarrhea, nausea and vomiting.  Genitourinary: Negative for frequency and urgency.  Musculoskeletal: Negative for back pain and joint pain.  Neurological: Positive for weakness. Negative for sensory change, speech change and focal weakness.  Psychiatric/Behavioral: Negative for depression and hallucinations. The patient is not nervous/anxious.      MEDICATIONS AT HOME:   Prior to Admission medications   Medication Sig Start Date End Date Taking? Authorizing Provider  Adalimumab ([name] PEN) 40 MG/0.4ML PNKT Inject 40 mg into the muscle once a week. 11/27/18  Yes [provider]  amLODipine (NORVASC) 10 MG tablet TAKE 1 TABLET BY MOUTH ONCE DAILY AS NEEDED 02/28/18  Yes [name], MD  aspirin EC 81 MG tablet Take 81 mg by mouth daily.   Yes [provider]  atorvastatin (LIPITOR) 80 MG tablet TAKE 1 TABLET BY MOUTH ONCE DAILY 08/08/18  Yes [name], MD  AURYXIA 1 GM 210 MG(Fe) tablet Take 420 mg by mouth 3 (three) times daily. 10/23/18  Yes [provider]  clopidogrel (PLAVIX) 75 MG tablet Take 1 tablet (75 mg total) by mouth daily. 08/08/17  Yes Dew, Erskine Squibb, MD  doxycycline (MONODOX) 100 MG capsule Take 100 mg by mouth 2 (two) times daily. 01/01/19  Yes [provider]  furosemide (LASIX) 80 MG tablet Take 1 tablet (80 mg total) by mouth 2 (two) times daily. 08/25/18  Yes [name], MD  gabapentin (NEURONTIN) 100 MG capsule Take 1 capsule (100 mg total) by mouth 2 (two) times daily. 08/22/18  Yes [name], MD  insulin  glargine (LANTUS) 100 UNIT/ML injection Inject as directed by physician up to 20 units daily 12/25/18  Yes [name], MD  levETIRAcetam (KEPPRA) 750 MG tablet Take 750 mg by mouth at bedtime. 10/16/18  Yes [provider]  lidocaine-prilocaine (EMLA) cream Apply 1 application topically as needed. 11/13/18  Yes [provider]  rifampin (RIFADIN) 300 MG capsule Take 300 mg by mouth 2 (two) times daily. 01/01/19  Yes [provider]  sevelamer carbonate (RENVELA) 800 MG tablet Take 1 tablet (800 mg total) by mouth 3 (three) times daily. 09/12/18  Yes [name], MD  triamcinolone ointment (KENALOG) 0.1 % Apply 1 application topically as needed.  05/02/17  Yes [provider]  Alcohol Swabs PADS Use as directed to check blood sugar three times daily for insulin dependent type 2 diabetes. 10/20/17   [name], MD  Blood Glucose Monitoring Suppl (ONE TOUCH ULTRA 2) w/Device KIT Use as directed to check blood sugar three times daily.  E11.9 02/20/18   [name], MD  Continuous Blood Gluc Receiver (FREESTYLE LIBRE READER) DEVI 1 Units by Does not apply route daily. USE TO CHECK BLOOD SUGAR UP TO FOUR TIMES A DAY AS DIRECTED. DX INSULIN DEPENDENT DIABETES 05/01/18   [name], MD  Continuous Blood Gluc Sensor ([place]) MISC Use to check blood sugar four times daily for insulin dependent diabetes. 04/26/18   [name], MD  glucose blood (ONE TOUCH ULTRA TEST) test strip Use as directed to check blood sugar three times daily. E11.9 02/20/18   [name], MD  Insulin Pen Needle 32G X 6 MM MISC Use to inject insulin via pen up to three times daily for insulin dependent type 2 diabetes. 10/20/17   [name], MD  NOVOLOG FLEXPEN 100 UNIT/ML FlexPen INJECT 15 UNITS SUBCUTANEOUSLY THREE TIMES DAILY Patient taking differently: 4 units before meals 12/25/17   [name], MD  ONE [facility] LANCETS MISC Use as directed to  check blood sugar three times daily. E11.9 02/20/18   [name], MD      VITAL SIGNS:  Blood pressure 131/74, pulse 67, temperature 98.5 F (36.9 C), temperature source Oral, resp. rate 18, height _0  (1.803 m), weight 87 kg, SpO2 100 %.  PHYSICAL EXAMINATION:  GENERAL:  58 y.o.-year-old patient lying in the bed with no acute distress. Appears chronically ill EYES: Pupils equal, round, reactive to light and accommodation. No scleral icterus. Generalized pallor extraocular muscles intact.  HEENT: Head atraumatic, normocephalic. Oropharynx and nasopharynx clear.  NECK:  Supple, no jugular venous distention. No thyroid enlargement, no tenderness.  LUNGS: Normal breath sounds bilaterally, no wheezing, rales,rhonchi or crepitation. No use of accessory muscles of respiration.  CARDIOVASCULAR: S1, S2 normal. No murmurs, rubs, or gallops.  ABDOMEN: Soft, nontender, nondistended. Bowel sounds present. No organomegaly or mass.  EXTREMITIES: amputation NEUROLOGIC: Cranial nerves II through XII are intact. Muscle strength 5/5 in all extremities. Sensation intact. Gait not checked.  PSYCHIATRIC: The patient is alert and oriented x 3.  SKIN: No obvious rash, lesion, or ulcer.   LABORATORY PANEL:   CBC Recent Labs  Lab 01/05/19 1628  WBC 8.0  HGB 5.4*  HCT 17.6*  PLT 304   ------------------------------------------------------------------------------------------------------------------  Chemistries  Recent Labs  Lab 01/05/19 1628  NA 136  K 3.2*  CL 98  CO2 29  GLUCOSE 175*  BUN 14  CREATININE 2.61*  CALCIUM 8.2*  AST 12*  ALT 10  ALKPHOS 86  BILITOT 0.3   ------------------------------------------------------------------------------------------------------------------  Cardiac Enzymes Recent Labs  Lab 01/05/19 1754  TROPONINI <0.03   ------------------------------------------------------------------------------------------------------------------  RADIOLOGY:   No results found.  EKG:    IMPRESSION AND PLAN:   Abdiel Brady  is a 58 y.o. male with a known history of iron deficiency anemia, Crohn's disease, diabetes, end-stage renal disease on hemodialysis comes to the emergency room from dialysis after completion of with low hemoglobin. He was told his hemoglobin of 5.0  1. Acute on chronic anemia -patient has iron deficiency anemia on iron pills -hemoglobin 7.5--9.2 -heme occult stools negative in the ER -will repeat again in the lab -IV Protonix BID -holding aspirin and Plavix -G.I. consultation placed with [name]--- haiku message sent -keep patient NPO in case he needs G.I. eval  2. End-stage renal disease on hemodialysis -continue in-house hemodialysis -discussed with Dr. Zollie Scale  3. Type II diabetes sliding scale insulin  4. DVT prophylaxis patient has amputation and with  anemia will not be able to use antiplatelet agents and or SCD    All the records are reviewed and case discussed with ED provider.   CODE STATUS: full  TOTAL TIME TAKING CARE OF THIS PATIENT: *50** minutes.    Fritzi Mandes M.D on 01/05/2019 at 6:45 PM  Between 7am to 6pm - Pager - [phone]  After 6pm go to [email or web address] - password [email or web address] [facility]  [facility]  Office  [phone]  CC: Primary care physician; [name], MD

## 2019-01-05 NOTE — ED Notes (Addendum)
ED TO INPATIENT HANDOFF REPORT  ED Nurse Name and Phone #: Larene Beach [phone]  S Name/Age/Gender [name] 58 y.o. male Room/Bed: ED03A/ED03A  Code Status   Code Status: Prior  Home/SNF/Other Home Patient oriented to: self, place, time and situation Is this baseline? Yes   Triage Complete: Triage complete  Chief Complaint dizzness; anemia  Triage Note Patient states he was at the dialysis clinic and was told his blood count was too low.  Patient states they said his hemoglobin was under 5.  Patient had a complete dialysis treatment today.  Patient is in no obvious distress at this time.     Allergies Allergies  Allergen Reactions  . Methotrexate Other (See Comments)    Blood count drops  . Vancomycin Shortness Of Breath    Eyes watering, SOB, wheezing  . Cefepime Other (See Comments)  . Tape     Level of Care/Admitting Diagnosis ED Disposition    ED Disposition Condition [facility] Area: [place] [100120]  Level of Care: Med-Surg [16]  Diagnosis: Anemia [213086]  Admitting Physician: Odessa Fleming  Attending Physician: Odessa Fleming  Estimated length of stay: past midnight tomorrow  Certification:: I certify this patient will need inpatient services for at least 2 midnights  PT Class (Do Not Modify): Inpatient [101]  PT Acc Code (Do Not Modify): Private [1]       B Medical/Surgery History Past Medical History:  Diagnosis Date  . Anemia   . Crohn disease ([place])   . Diabetes mellitus without complication ([place])   . DVT of lower extremity (deep venous thrombosis) ([place]) 2016  . Hidradenitis suppurativa   . Hypertension   . ICH (intracerebral hemorrhage) ([place])   . Peritonitis ([place]) 04/21/2017  . Pyogenic arthritis of knee ([place]) 02/04/2016  . Renal disorder   . Sepsis ([place]) 01/12/2018   Past Surgical History:  Procedure Laterality Date  . ABDOMINAL SURGERY    . ANGIOPLASTY Left    left [facility]  04-11-2018  . BELOW KNEE LEG AMPUTATION Right 08/2017   [facility]  . DIALYSIS/PERMA CATHETER INSERTION N/A 12/09/2017   Procedure: DIALYSIS/PERMA CATHETER INSERTION;  Surgeon: Katha Cabal, MD;  Location: [place] [facility];  Service: Cardiovascular;  Laterality: N/A;  . DIALYSIS/PERMA CATHETER INSERTION N/A 12/12/2017   Procedure: DIALYSIS/PERMA CATHETER INSERTION;  Surgeon: Algernon Huxley, MD;  Location: [place] [facility];  Service: Cardiovascular;  Laterality: N/A;  . DIALYSIS/PERMA CATHETER REMOVAL Left 12/09/2017   Procedure: DIALYSIS/PERMA CATHETER REMOVAL;  Surgeon: Katha Cabal, MD;  Location: [place] [facility];  Service: Cardiovascular;  Laterality: Left;  . KNEE SURGERY Left 02/04/2016   [facility]  . LOWER EXTREMITY ANGIOGRAPHY Right 08/08/2017   Procedure: Lower Extremity Angiography;  Surgeon: Algernon Huxley, MD;  Location: [place] [facility];  Service: Cardiovascular;  Laterality: Right;  . LOWER EXTREMITY ANGIOGRAPHY Right 08/22/2017   Procedure: Lower Extremity Angiography;  Surgeon: Algernon Huxley, MD;  Location: [place] [facility];  Service: Cardiovascular;  Laterality: Right;  . LOWER EXTREMITY INTERVENTION  08/08/2017   Procedure: LOWER EXTREMITY INTERVENTION;  Surgeon: Algernon Huxley, MD;  Location: [place] [facility];  Service: Cardiovascular;;  . LOWER EXTREMITY INTERVENTION  08/22/2017   Procedure: LOWER EXTREMITY INTERVENTION;  Surgeon: Algernon Huxley, MD;  Location: [place] [facility];  Service: Cardiovascular;;     A IV Location/Drains/Wounds Patient Lines/Drains/Airways Status   Active Line/Drains/Airways    Name:  Placement date:   Placement time:   Site:   Days:   Peripheral IV 01/05/19 Left Forearm   01/05/19    1738    Forearm   less than 1   Incision (Closed) 12/09/17 Neck Anterior;Left   12/09/17    2300     392   Incision (Closed) 12/09/17 Breast Left;Medial;Upper   12/09/17    2300     392   Pressure Injury 12/04/17 Stage I -  Intact skin with  non-blanchable redness of a localized area usually over a bony prominence. partially healing   12/04/17    0530     397          Intake/Output Last 24 hours No intake or output data in the 24 hours ending 01/05/19 1852  Labs/Imaging Results for orders placed or performed during the hospital encounter of 01/05/19 (from the past 48 hour(s))  Comprehensive metabolic panel     Status: Abnormal   Collection Time: 01/05/19  4:28 PM  Result Value Ref Range   Sodium 136 135 - 145 mmol/L   Potassium 3.2 (L) 3.5 - 5.1 mmol/L   Chloride 98 98 - 111 mmol/L   CO2 29 22 - 32 mmol/L   Glucose, Bld 175 (H) 70 - 99 mg/dL   BUN 14 6 - 20 mg/dL   Creatinine, Ser 2.61 (H) 0.61 - 1.24 mg/dL   Calcium 8.2 (L) 8.9 - 10.3 mg/dL   Total Protein 7.6 6.5 - 8.1 g/dL   Albumin 2.8 (L) 3.5 - 5.0 g/dL   AST 12 (L) 15 - 41 U/L   ALT 10 0 - 44 U/L   Alkaline Phosphatase 86 38 - 126 U/L   Total Bilirubin 0.3 0.3 - 1.2 mg/dL   GFR calc non Af Amer 26 (L) >60 mL/min   GFR calc Af Amer 30 (L) >60 mL/min   Anion gap 9 5 - 15    Comment: Performed at [facility], [address] [place], [place] [address]  CBC     Status: Abnormal   Collection Time: 01/05/19  4:28 PM  Result Value Ref Range   WBC 8.0 4.0 - 10.5 K/uL   RBC 1.85 (L) 4.22 - 5.81 MIL/uL   Hemoglobin 5.4 (L) 13.0 - 17.0 g/dL   HCT 17.6 (L) 39.0 - 52.0 %   MCV 95.1 80.0 - 100.0 fL   MCH 29.2 26.0 - 34.0 pg   MCHC 30.7 30.0 - 36.0 g/dL   RDW 15.9 (H) 11.5 - 15.5 %   Platelets 304 150 - 400 K/uL   nRBC 0.0 0.0 - 0.2 %    Comment: Performed at [facility], [address] [place], [place] [address]  Type and screen [place]     Status: None (Preliminary result)   Collection Time: 01/05/19  4:28 PM  Result Value Ref Range   ABO/RH(D) B POS    Antibody Screen NEG    Sample Expiration 01/08/2019    Unit Number [id number]    Blood Component Type RED CELLS,LR    Unit division 00    Status of Unit  ISSUED    Transfusion Status OK TO TRANSFUSE    Crossmatch Result      Compatible Performed at [facility], [address] [place], [place] [address]   Protime-INR     Status: None   Collection Time: 01/05/19  5:42 PM  Result Value Ref Range   Prothrombin Time 13.0 11.4 -  15.2 seconds   INR 1.0 0.8 - 1.2    Comment: (NOTE) INR goal varies based on device and disease states. Performed at [facility], [address] [place], [place] [address]   APTT     Status: None   Collection Time: 01/05/19  5:42 PM  Result Value Ref Range   aPTT 36 24 - 36 seconds    Comment: Performed at [facility], [address] [place], [place] [address]  Troponin I - Add-On to previous collection     Status: None   Collection Time: 01/05/19  5:54 PM  Result Value Ref Range   Troponin I <0.03 <0.03 ng/mL    Comment: Performed at [facility], [address] [name], [place] [address]  Prepare RBC     Status: None   Collection Time: 01/05/19  6:00 PM  Result Value Ref Range   Order Confirmation      ORDER PROCESSED BY BLOOD BANK Performed at [facility], [address] [place], [place] [address]    No results found.  Pending Labs Unresulted Labs (From admission, onward)    Start     Ordered   01/05/19 1825  Iron and TIBC  Add-on,   AD     01/05/19 1825   Unscheduled  Occult blood card to lab, stool  As needed,   STAT     01/05/19 1825   Signed and Held  Hemoglobin  Once,   R    Comments:  2 hours after BT    Signed and Held          Vitals/Pain Today's Vitals   01/05/19 1815 01/05/19 1820 01/05/19 1830 01/05/19 1845  BP:  131/74 133/74 114/65  Pulse: 65 67 66 66  Resp: 15 18 20  (!) 23  Temp:  98.5 F (36.9 C)    TempSrc:  Oral    SpO2: 100% 100% 100% 99%  Weight:      Height:      PainSc:        Isolation Precautions No active isolations  Medications Medications  pantoprazole (PROTONIX) injection 40 mg (has no  administration in time range)  0.9 %  sodium chloride infusion (10 mL/hr Intravenous Bolus 01/05/19 1806)    Mobility walks Moderate fall risk   Focused Assessments other: pt sent for low hgb/hct with outpatient; pt was cold, tired, SOB, etc from low hbg   R Recommendations: See Admitting Provider Note  Report given to:   Additional Notes: pt AOx4, handling transfusion well, only c/o of being cold and tired; attempted x2 to get second IV while running blood without success; cannot stick left arm

## 2019-01-06 LAB — CBC WITH DIFFERENTIAL/PLATELET
Abs Immature Granulocytes: 0.03 10*3/uL (ref 0.00–0.07)
Basophils Absolute: 0 10*3/uL (ref 0.0–0.1)
Basophils Relative: 1 %
Eosinophils Absolute: 0.1 10*3/uL (ref 0.0–0.5)
Eosinophils Relative: 2 %
HCT: 19.9 % — ABNORMAL LOW (ref 39.0–52.0)
Hemoglobin: 6 g/dL — ABNORMAL LOW (ref 13.0–17.0)
Immature Granulocytes: 1 %
Lymphocytes Relative: 25 %
Lymphs Abs: 1.6 10*3/uL (ref 0.7–4.0)
MCH: 28.4 pg (ref 26.0–34.0)
MCHC: 30.2 g/dL (ref 30.0–36.0)
MCV: 94.3 fL (ref 80.0–100.0)
Monocytes Absolute: 0.4 10*3/uL (ref 0.1–1.0)
Monocytes Relative: 7 %
Neutro Abs: 4.1 10*3/uL (ref 1.7–7.7)
Neutrophils Relative %: 64 %
Platelets: 245 10*3/uL (ref 150–400)
RBC: 2.11 MIL/uL — ABNORMAL LOW (ref 4.22–5.81)
RDW: 15.4 % (ref 11.5–15.5)
WBC: 6.2 10*3/uL (ref 4.0–10.5)
nRBC: 0 % (ref 0.0–0.2)

## 2019-01-06 LAB — HEMOGLOBIN AND HEMATOCRIT, BLOOD
HCT: 21.3 % — ABNORMAL LOW (ref 39.0–52.0)
HCT: 22.5 % — ABNORMAL LOW (ref 39.0–52.0)
Hemoglobin: 6.6 g/dL — ABNORMAL LOW (ref 13.0–17.0)
Hemoglobin: 7.1 g/dL — ABNORMAL LOW (ref 13.0–17.0)

## 2019-01-06 LAB — MRSA PCR SCREENING: MRSA by PCR: NEGATIVE

## 2019-01-06 LAB — PREPARE RBC (CROSSMATCH)

## 2019-01-06 MED ORDER — POTASSIUM CHLORIDE 20 MEQ PO PACK
40.0000 meq | PACK | Freq: Once | ORAL | Status: AC
  Administered 2019-01-06: 40 meq via ORAL
  Filled 2019-01-06: qty 2

## 2019-01-06 MED ORDER — LISINOPRIL 10 MG PO TABS
10.0000 mg | ORAL_TABLET | ORAL | Status: DC
  Administered 2019-01-07: 10 mg via ORAL
  Filled 2019-01-06: qty 1

## 2019-01-06 MED ORDER — SODIUM CHLORIDE 0.9% IV SOLUTION: Freq: Once | INTRAVENOUS | Status: DC

## 2019-01-06 NOTE — Consult Note (Signed)
[facility] GI Inpatient Consult Note   Kathline Magic, M.D.  Reason for Consult:  Anemia   Attending Requesting Consult: Terri Piedra, M.D.  History of Present Illness: [name] is a 58 y.o. male with a history of Renal failure Crohn's disease, diabetes and hypertension presenting for hospital admission after blood work at hemodialysis yesterday showed a hemoglobin of 4.8.  Patient denies any abdominal pain, hematochezia, hematemesis or melena.  He says he does not feel unwell.  He has had no diarrhea or any flare of Crohn's disease recently.  He underwent a colonoscopy at [facility] and 2015 revealing mucosal scarring but no active colitis.  Benign polyps were removed from the colon at the time. The patient's hemoglobin has increased to 6.0 after 1 unit of packed red blood cell transfusion.  He denies shortness of breath or chest pain at this time. The patient has been complaining of some "dark stools" and there appears to be varying accounts of the patient's history although he tells me that he has had dark stools "for a long time" secondary to taking iron supplementation although he may have spoken to another physician saying that they were black and tarry and had worsened over the last few days.  Patient denies a family history of colon cancer. The patient denies any nausea vomiting reflux, dysphagia or weight loss.  Past Medical History:  Past Medical History:  Diagnosis Date  . Anemia   . Crohn disease ([place])   . Diabetes mellitus without complication ([place])   . DVT of lower extremity (deep venous thrombosis) ([place]) 2016  . Hidradenitis suppurativa   . Hypertension   . ICH (intracerebral hemorrhage) ([place])   . Peritonitis ([place]) 04/21/2017  . Pyogenic arthritis of knee ([place]) 02/04/2016  . Renal disorder   . Sepsis ([place]) 01/12/2018    Problem List: Patient Active Problem List   Diagnosis Date Noted  . Anemia 01/05/2019  . Peripheral vascular  disease ([place]) 06/28/2018  . Encephalopathy 12/04/2017  . Pressure injury of skin 12/04/2017  . S/P BKA (below knee amputation) unilateral, right ([place]) 10/20/2017  . Atherosclerosis of native arteries of extremity with rest pain ([place]) 08/05/2017  . Empyema ([place]) 05/20/2017  . History of CVA (cerebrovascular accident) 04/15/2017  . Seizure ([place]) 04/15/2017  . End stage renal failure on dialysis ([place]) 04/12/2016  . Aphthae 02/20/2016  . Hidradenitis 02/20/2016  . Leg pain 02/20/2016  . Neuropathy 02/20/2016  . Narrowing of intervertebral disc space 08/29/2015  . Vascular disorder of lower extremity 08/29/2015  . Failure of erection 08/29/2015  . Accumulation of fluid in tissues 08/29/2015  . Hypercholesteremia 08/29/2015  . Hypertension 08/29/2015  . Anemia due to chronic kidney disease 05/26/2015  . Venous insufficiency of leg 09/04/2014  . Deep vein thrombosis of lower extremity ([place]) 08/16/2014  . Prostatic intraepithelial neoplasia 11/02/2013  . Elevated prostate specific antigen (PSA) 09/11/2013  . Benign prostatic hyperplasia with urinary obstruction 08/13/2013  . Spermatocele 08/13/2013  . Avitaminosis D 01/25/2013  . Abnormal presence of protein in urine 03/28/2012  . Type 2 diabetes mellitus with kidney complication, with long-term current use of insulin ([place]) 03/28/2012  . Crohn's disease ([place]) 08/03/2011    Past Surgical History: Past Surgical History:  Procedure Laterality Date  . ABDOMINAL SURGERY    . ANGIOPLASTY Left    left fem-pop at [facility] 04-11-2018  . BELOW KNEE LEG AMPUTATION Right 08/2017   [facility]  . DIALYSIS/PERMA CATHETER INSERTION N/A 12/09/2017  Procedure: DIALYSIS/PERMA CATHETER INSERTION;  Surgeon: Katha Cabal, MD;  Location: [place] [facility];  Service: Cardiovascular;  Laterality: N/A;  . DIALYSIS/PERMA CATHETER INSERTION N/A 12/12/2017   Procedure: DIALYSIS/PERMA CATHETER INSERTION;  Surgeon: Algernon Huxley, MD;  Location: [place] [facility];   Service: Cardiovascular;  Laterality: N/A;  . DIALYSIS/PERMA CATHETER REMOVAL Left 12/09/2017   Procedure: DIALYSIS/PERMA CATHETER REMOVAL;  Surgeon: Katha Cabal, MD;  Location: [place] [facility];  Service: Cardiovascular;  Laterality: Left;  . KNEE SURGERY Left 02/04/2016   [facility]  . LOWER EXTREMITY ANGIOGRAPHY Right 08/08/2017   Procedure: Lower Extremity Angiography;  Surgeon: Algernon Huxley, MD;  Location: [place] [facility];  Service: Cardiovascular;  Laterality: Right;  . LOWER EXTREMITY ANGIOGRAPHY Right 08/22/2017   Procedure: Lower Extremity Angiography;  Surgeon: Algernon Huxley, MD;  Location: [place] [facility];  Service: Cardiovascular;  Laterality: Right;  . LOWER EXTREMITY INTERVENTION  08/08/2017   Procedure: LOWER EXTREMITY INTERVENTION;  Surgeon: Algernon Huxley, MD;  Location: [place] [facility];  Service: Cardiovascular;;  . LOWER EXTREMITY INTERVENTION  08/22/2017   Procedure: LOWER EXTREMITY INTERVENTION;  Surgeon: Algernon Huxley, MD;  Location: [place] [facility];  Service: Cardiovascular;;    Allergies: Allergies  Allergen Reactions  . Methotrexate Other (See Comments)    Blood count drops  . Vancomycin Shortness Of Breath    Eyes watering, SOB, wheezing  . Cefepime Other (See Comments)  . Tape     Home Medications: Medications Prior to Admission  Medication Sig Dispense Refill Last Dose  . Adalimumab ([name] PEN) 40 MG/0.4ML PNKT Inject 40 mg into the muscle once a week.   Past Week at Unknown time  . amLODipine (NORVASC) 10 MG tablet TAKE 1 TABLET BY MOUTH ONCE DAILY AS NEEDED 30 tablet 12 01/04/2019 at 2000  . aspirin EC 81 MG tablet Take 81 mg by mouth daily.   01/05/2019 at 0800  . atorvastatin (LIPITOR) 80 MG tablet TAKE 1 TABLET BY MOUTH ONCE DAILY 30 tablet 11 01/04/2019 at 2000  . AURYXIA 1 GM 210 MG(Fe) tablet Take 420 mg by mouth 3 (three) times daily.   01/05/2019 at 0800  . clopidogrel (PLAVIX) 75 MG tablet Take 1 tablet (75 mg total) by mouth daily.  30 tablet 11 01/05/2019 at 0800  . doxycycline (MONODOX) 100 MG capsule Take 100 mg by mouth 2 (two) times daily.   01/05/2019 at 0800  . furosemide (LASIX) 80 MG tablet Take 1 tablet (80 mg total) by mouth 2 (two) times daily. 60 tablet 4 01/05/2019 at 0800  . gabapentin (NEURONTIN) 100 MG capsule Take 1 capsule (100 mg total) by mouth 2 (two) times daily. 60 capsule 5 01/05/2019 at 0800  . insulin glargine (LANTUS) 100 UNIT/ML injection Inject as directed by physician up to 20 units daily 10 mL 5 01/04/2019 at 2000  . levETIRAcetam (KEPPRA) 750 MG tablet Take 750 mg by mouth at bedtime.   01/04/2019 at 2000  . lidocaine-prilocaine (EMLA) cream Apply 1 application topically as needed.   Past Week at prn  . rifampin (RIFADIN) 300 MG capsule Take 300 mg by mouth 2 (two) times daily.   01/05/2019 at 0800  . sevelamer carbonate (RENVELA) 800 MG tablet Take 1 tablet (800 mg total) by mouth 3 (three) times daily. 270 tablet 3 01/05/2019 at 0800  . triamcinolone ointment (KENALOG) 0.1 % Apply 1 application topically as needed.   0 Past Week at Unknown time  .  Alcohol Swabs PADS Use as directed to check blood sugar three times daily for insulin dependent type 2 diabetes. 100 each 12 Taking  . Blood Glucose Monitoring Suppl (ONE TOUCH ULTRA 2) w/Device KIT Use as directed to check blood sugar three times daily. E11.9 1 each 0 Taking  . Continuous Blood Gluc Receiver (FREESTYLE LIBRE READER) DEVI 1 Units by Does not apply route daily. USE TO CHECK BLOOD SUGAR UP TO FOUR TIMES A DAY AS DIRECTED. DX INSULIN DEPENDENT DIABETES 1 Device 1 Taking  . Continuous Blood Gluc Sensor (FREESTYLE LIBRE SENSOR SYSTEM) MISC Use to check blood sugar four times daily for insulin dependent diabetes. 1 each 12 Taking  . glucose blood (ONE TOUCH ULTRA TEST) test strip Use as directed to check blood sugar three times daily. E11.9 100 each 12 Taking  . Insulin Pen Needle 32G X 6 MM MISC Use to inject insulin via pen up to three times daily  for insulin dependent type 2 diabetes. 100 each 12 Taking  . NOVOLOG FLEXPEN 100 UNIT/ML FlexPen INJECT 15 UNITS SUBCUTANEOUSLY THREE TIMES DAILY (Patient taking differently: 4 units before meals) 15 mL 3 Taking  . ONE TOUCH LANCETS MISC Use as directed to check blood sugar three times daily. E11.9 100 each 11 Taking   Home medication reconciliation was completed with the patient.   Scheduled Inpatient Medications:   . sodium chloride   Intravenous Once  . atorvastatin  80 mg Oral Daily  . ferric citrate  420 mg Oral TID WC  . gabapentin  100 mg Oral BID  . insulin aspart  0-9 Units Subcutaneous TID WC  . levETIRAcetam  750 mg Oral QHS  . pantoprazole (PROTONIX) IV  40 mg Intravenous Q12H  . potassium chloride  40 mEq Oral Once  . rifampin  300 mg Oral BID  . sevelamer carbonate  800 mg Oral TID WC    Continuous Inpatient Infusions:    PRN Inpatient Medications:  acetaminophen **OR** acetaminophen, amLODipine, ondansetron **OR** ondansetron (ZOFRAN) IV, polyethylene glycol  Family History: family history includes Arthritis in his brother; Diabetes in his mother, sister, sister, and sister; Heart disease in his father and mother; Irritable bowel syndrome in his sister; Psoriasis in his brother; Rheumatic fever in his father.   GI Family History: Negative  Social History:   reports that he has never smoked. He has never used smokeless tobacco. He reports current drug use. Drug: Marijuana. He reports that he does not drink alcohol. The patient denies ETOH, tobacco, or drug use.    Review of Systems: Review of Systems - General ROS: negative Psychological ROS: negative ENT ROS: negative Allergy and Immunology ROS: negative Hematological and Lymphatic ROS: negative Respiratory ROS: no cough, shortness of breath, or wheezing Cardiovascular ROS: no chest pain or dyspnea on exertion Genito-Urinary ROS: no dysuria, trouble voiding, or hematuria Musculoskeletal ROS: negative  Neurological ROS: no TIA or stroke symptoms Dermatological ROS: negative  Physical Examination: BP 116/70   Pulse (!) 58   Temp 98.9 F (37.2 C) (Oral)   Resp 16   Ht '5\' 11"'  (1.803 m)   Wt 87 kg   SpO2 100%   BMI 26.75 kg/m  Physical Exam Vitals signs reviewed.  Constitutional:      General: He is not in acute distress.    Appearance: Normal appearance. He is not ill-appearing, toxic-appearing or diaphoretic.  HENT:     Head: Normocephalic.     Right Ear: Tympanic membrane normal.  Nose: Nose normal.     Mouth/Throat:     Mouth: Mucous membranes are moist.  Eyes:     General: No scleral icterus.    Extraocular Movements: Extraocular movements intact.     Conjunctiva/sclera: Conjunctivae normal.     Pupils: Pupils are equal, round, and reactive to light.  Neck:     Musculoskeletal: Normal range of motion.  Cardiovascular:     Pulses: Normal pulses.  Pulmonary:     Effort: Pulmonary effort is normal.     Breath sounds: No stridor. No rhonchi.  Abdominal:     General: Abdomen is flat. There is no distension.     Palpations: There is no mass.     Tenderness: There is no abdominal tenderness. There is no guarding or rebound.     Hernia: No hernia is present.  Musculoskeletal:       Legs:  Skin:    General: Skin is warm and dry.  Neurological:     General: No focal deficit present.     Mental Status: He is alert.  Psychiatric:        Mood and Affect: Mood normal.     Data: Lab Results  Component Value Date   WBC 6.2 01/06/2019   HGB 6.0 (L) 01/06/2019   HCT 19.9 (L) 01/06/2019   MCV 94.3 01/06/2019   PLT 245 01/06/2019   Recent Labs  Lab 01/05/19 1628 01/05/19 2228 01/06/19 0641  HGB 5.4* 5.8* 6.0*   Lab Results  Component Value Date   NA 136 01/05/2019   K 3.2 (L) 01/05/2019   CL 98 01/05/2019   CO2 29 01/05/2019   BUN 14 01/05/2019   CREATININE 2.61 (H) 01/05/2019   Lab Results  Component Value Date   ALT 10 01/05/2019   AST 12 (L)  01/05/2019   ALKPHOS 86 01/05/2019   BILITOT 0.3 01/05/2019   Recent Labs  Lab 01/05/19 1742  APTT 36  INR 1.0   CBC Latest Ref Rng & Units 01/06/2019 01/05/2019 01/05/2019  WBC 4.0 - 10.5 K/uL 6.2 - 8.0  Hemoglobin 13.0 - 17.0 g/dL 6.0(L) 5.8(L) 5.4(L)  Hematocrit 39.0 - 52.0 % 19.9(L) - 17.6(L)  Platelets 150 - 400 K/uL 245 - 304    STUDIES: No results found. '@IMAGES' @  Assessment: 1.  Anemia- this is significant and etiology is unknown and there appears to be no overt or microscopic amount of GI blood loss based upon recent testing.  Cannot rule out an occult GI malignancy however without further investigation.In the absence of overt gastrointestinal bleeding, however, this is not considered an urgent investigation to be undertaken at this time. 2.  History of Crohn's disease, asymptomatic, treated with [name]. 3.  End-stage renal disease on hemodialysis. 4.  Diabetes mellitus.  Recommendations: 1.  I discussed the possibility of proceeding with EGD and colonoscopy to evaluate profound asymptomatic anemia.The patient understands the nature of the planned procedure, indications, risks, alternatives and potential complications including but not limited to bleeding, infection, perforation, damage to internal organs and possible oversedation/side effects from anesthesia.  The patient is also aware of increased risk of contracting viral infections. the patient DECLINES  ALL INVASIVE PROCEDURES, INCLUDING BUT NOT LIMITED TO EGD AND COLONOSCOPY, and would like to wait to perform procedures at a later date.  2.  Continue supportive care and transfuse as clinically necessary.  3.  I would advise elective colonoscopy and upper endoscopy can be done in 2 to 4 weeks time if the risk  will be presumably less from an infection standpoint to proceed.  4.  Resume renal diet.  5.  Case and patient wishes were discussed with Dr. Margaretmary Eddy, the attending physician.  6. Will sign off for now. Call me at  anytime if I can be of assistance.  Please refer to procedure notes for findings, recommendations and patient disposition/instructions.  Thank you for the consult. Please call with questions or concerns.  Olean Ree, "Lanny Hurst MD [facility] [place], [place] [address] [phone]  01/06/2019 1:34 PM

## 2019-01-06 NOTE — Progress Notes (Signed)
Per pt and wife [name], Pt takes lisinopril 10 mg in am Sat, Sun, Tue, and Thur. (non HD days). MD Gouru paged and per MD ok to restart lisinopril. Last BP 147/76. Pt refused to take it this evening but he will take it tomorrow morning. Pt and wife updated about tx plan and the need for him to stay overnight to be monitored w Hgb of 6.6. Patient was educated about how critical that number is and he verbalized understanding but seem somehow upset about staying another day.

## 2019-01-06 NOTE — Progress Notes (Signed)
[facility]  ROUNDING NOTE   Subjective:  Patient well-known to [place] from prior admissions. Resting comfortably in bed. Hemoglobin was quite low upon admission at 4.8. Patient status post transfusion and hemoglobin currently 6.0. Receiving another unit. He has noted black stools but felt this was secondary to [place].   Objective:  Vital signs in last 24 hours:  Temp:  [98.3 F (36.8 C)-98.9 F (37.2 C)] 98.9 F (37.2 C) (03/21 1104) Pulse Rate:  [58-81] 58 (03/21 1104) Resp:  [14-23] 16 (03/21 1104) BP: (111-144)/(53-79) 116/70 (03/21 1104) SpO2:  [98 %-100 %] 100 % (03/21 1104) Weight:  [87 kg] 87 kg (03/20 1619)  Weight change:  Filed Weights   01/05/19 1619  Weight: 87 kg    Intake/Output: I/O last 3 completed shifts: In: 340 [Blood:340] Out: -    Intake/Output this shift:  No intake/output data recorded.  Physical Exam: General: No acute distress  Head: Normocephalic, atraumatic. Moist oral mucosal membranes  Eyes: Anicteric  Neck: Supple, trachea midline  Lungs:  Clear to auscultation, normal effort  Heart: S1S2 no rubs  Abdomen:  Soft, nontender, bowel sounds present  Extremities: No peripheral edema, b/l LE amputation  Neurologic: Awake, alert, following commands  Skin: No lesions  Access: LUE AVF    Basic Metabolic Panel: Recent Labs  Lab 01/05/19 1628  NA 136  K 3.2*  CL 98  CO2 29  GLUCOSE 175*  BUN 14  CREATININE 2.61*  CALCIUM 8.2*    Liver Function Tests: Recent Labs  Lab 01/05/19 1628  AST 12*  ALT 10  ALKPHOS 86  BILITOT 0.3  PROT 7.6  ALBUMIN 2.8*   No results for input(s): LIPASE, AMYLASE in the last 168 hours. No results for input(s): AMMONIA in the last 168 hours.  CBC: Recent Labs  Lab 01/05/19 1300 01/05/19 1628 01/05/19 2228 01/06/19 0641  WBC  --  8.0  --  6.2  NEUTROABS  --   --   --  4.1  HGB 4.8* 5.4* 5.8* 6.0*  HCT 15.3* 17.6*  --  19.9*  MCV  --  95.1  --  94.3  PLT  --  304  --  245     Cardiac Enzymes: Recent Labs  Lab 01/05/19 1754  TROPONINI <0.03    BNP: Invalid input(s): POCBNP  CBG: No results for input(s): GLUCAP in the last 168 hours.  Microbiology: Results for orders placed or performed during the hospital encounter of 01/05/19  MRSA PCR Screening     Status: None   Collection Time: 01/05/19 10:36 PM  Result Value Ref Range Status   MRSA by PCR NEGATIVE NEGATIVE Final    Comment:        The GeneXpert MRSA Assay (FDA approved for NASAL specimens only), is one component of a comprehensive MRSA colonization surveillance program. It is not intended to diagnose MRSA infection nor to guide or monitor treatment for MRSA infections. Performed at [facility], [address] [place], [place] [address]     Coagulation Studies: Recent Labs    01/05/19 1742  LABPROT 13.0  INR 1.0    Urinalysis: No results for input(s): COLORURINE, LABSPEC, PHURINE, GLUCOSEU, HGBUR, BILIRUBINUR, KETONESUR, PROTEINUR, UROBILINOGEN, NITRITE, LEUKOCYTESUR in the last 72 hours.  Invalid input(s): APPERANCEUR    Imaging: No results found.   Medications:    . sodium chloride   Intravenous Once  . atorvastatin  80 mg Oral Daily  . ferric citrate  420 mg Oral TID  WC  . gabapentin  100 mg Oral BID  . insulin aspart  0-9 Units Subcutaneous TID WC  . levETIRAcetam  750 mg Oral QHS  . pantoprazole (PROTONIX) IV  40 mg Intravenous Q12H  . potassium chloride  40 mEq Oral Once  . rifampin  300 mg Oral BID  . sevelamer carbonate  800 mg Oral TID WC   acetaminophen **OR** acetaminophen, amLODipine, ondansetron **OR** ondansetron (ZOFRAN) IV, polyethylene glycol  Assessment/ Plan:  58 y.o. male with ESRD on HD MWF followed by [facility] nephrology, diabetes mellitus type 2, history of DVT, right below the knee amputation, hypertension, history of intracerebral hemorrhage, anemia of chronic kidney disease, secondary hyperparathyroidism, seizure disorder    [facility] nephrology/[address]/MWF.  1.  ESRD on HD MWF.  Patient did undergo hemodialysis as an outpatient yesterday.  No acute indication for dialysis today.  We will plan for dialysis again on Monday.  2.  Anemia of chronic kidney disease.  Hemoglobin quite low at 4.8 upon admission.  Undergoing second blood transfusion today.  Agree with gastroenterology consultation.  Patient has been having black stools but of note he is on ferric citrate which can lead to this as well.  3.  Secondary hyperparathyroidism.  Check intact PTH and phosphorus with next dialysis treatment.  Otherwise maintain the patient on Auryxia 2 tablets p.o. 3 times daily with meals.  4.  Diabetes mellitus type 2 with chronic kidney disease.  Insulin management as per hospitalist.   LOS: 1 Adisa Litt 3/21/202012:09 PM

## 2019-01-06 NOTE — Progress Notes (Signed)
[facility] [place] NAME: [name]    MR#:  [id number]  DATE OF BIRTH:  1961/01/31  SUBJECTIVE:  CHIEF COMPLAINT: Patient is reporting black tarry stool for the past several days, he assumed his home medications Renvela causing black stool.  Denies any abdominal pain.  Received 1 unit of blood transfusion yesterday hemoglobin at 6.0 today denies any nausea vomiting Gets hemodialysis on Monday Wednesday and Friday  REVIEW OF SYSTEMS:  CONSTITUTIONAL: No fever, fatigue or weakness.  EYES: No blurred or double vision.  EARS, NOSE, AND THROAT: No tinnitus or ear pain.  RESPIRATORY: No cough, shortness of breath, wheezing or hemoptysis.  CARDIOVASCULAR: No chest pain, orthopnea, edema.  GASTROINTESTINAL: No nausea, vomiting, diarrhea or abdominal pain.  Reports black tarry stool GENITOURINARY: No dysuria, hematuria.  ENDOCRINE: No polyuria, nocturia,  HEMATOLOGY: No anemia, easy bruising or bleeding SKIN: No rash or lesion. MUSCULOSKELETAL: No joint pain or arthritis.   NEUROLOGIC: No tingling, numbness, weakness.  PSYCHIATRY: No anxiety or depression.   DRUG ALLERGIES:   Allergies  Allergen Reactions  . Methotrexate Other (See Comments)    Blood count drops  . Vancomycin Shortness Of Breath    Eyes watering, SOB, wheezing  . Cefepime Other (See Comments)  . Tape     VITALS:  Blood pressure 116/70, pulse (!) 58, temperature 98.9 F (37.2 C), temperature source Oral, resp. rate 16, height 5' 11"  (1.803 m), weight 87 kg, SpO2 100 %.  PHYSICAL EXAMINATION:  GENERAL:  58 y.o.-year-old patient lying in the bed with no acute distress.  EYES: Pupils equal, round, reactive to light and accommodation. No scleral icterus. Extraocular muscles intact.  HEENT: Head atraumatic, normocephalic. Oropharynx and nasopharynx clear.  NECK:  Supple, no jugular venous distention. No thyroid enlargement, no tenderness.  LUNGS: Normal breath  sounds bilaterally, no wheezing, rales,rhonchi or crepitation. No use of accessory muscles of respiration.  CARDIOVASCULAR: S1, S2 normal. No murmurs, rubs, or gallops.  ABDOMEN: Soft, nontender, nondistended. Bowel sounds present.  EXTREMITIES: No pedal edema, cyanosis, or clubbing.  Bilateral BKA NEUROLOGIC: Awake alert and oriented x3 sensation intact. Gait not checked.  PSYCHIATRIC: The patient is alert and oriented x 3.  SKIN: No obvious rash, lesion, or ulcer.    LABORATORY PANEL:   CBC Recent Labs  Lab 01/06/19 0641  WBC 6.2  HGB 6.0*  HCT 19.9*  PLT 245   ------------------------------------------------------------------------------------------------------------------  Chemistries  Recent Labs  Lab 01/05/19 1628  NA 136  K 3.2*  CL 98  CO2 29  GLUCOSE 175*  BUN 14  CREATININE 2.61*  CALCIUM 8.2*  AST 12*  ALT 10  ALKPHOS 86  BILITOT 0.3   ------------------------------------------------------------------------------------------------------------------  Cardiac Enzymes Recent Labs  Lab 01/05/19 1754  TROPONINI <0.03   ------------------------------------------------------------------------------------------------------------------  RADIOLOGY:  No results found.  EKG:   Orders placed or performed during the hospital encounter of 01/05/19  . ED EKG  . ED EKG    ASSESSMENT AND PLAN:   [name]  is a 58 y.o. male with a known history of iron deficiency anemia, Crohn's disease, diabetes, end-stage renal disease on hemodialysis comes to the emergency room from dialysis after completion of with low hemoglobin. He was told his hemoglobin of 5.0  1. Acute on chronic anemia-patient is reporting melena -patient has iron deficiency anemia on iron pills -hemoglobin 5.4-5.8-1 unit blood transfusion -6.0-1 unit blood transfusion ordered -heme occult stools negative in the ER -will repeat again in  the lab -IV Protonix BID -holding aspirin and  Plavix -G.I. consultation placed with [name]--- haiku message sent, he is aware of the consult -keep patient NPO until GI sees the patient -Monitor hemoglobin hematocrit closely  2. End-stage renal disease on hemodialysis  -continue in-house hemodialysis -Dr. Zollie Scale consulted.  Patient had hemodialysis yesterday on Friday   3. Type II diabetes sliding scale insulin  4.  Hypokalemia Provide potassium supplements and monitor    DVT prophylaxis patient has amputation and with anemia will not be able to use antiplatelet agents and or SCD     All the records are reviewed and case discussed with Care Management/Social Workerr. Management plans discussed with the patient, he is  in agreement.  CODE STATUS: FC  TOTAL TIME TAKING CARE OF THIS PATIENT: 37 minutes.   POSSIBLE D/C IN 1-2  DAYS, DEPENDING ON CLINICAL CONDITION.  Note: This dictation was prepared with Dragon dictation along with smaller phrase technology. Any transcriptional errors that result from this process are unintentional.   Nicholes Mango M.D on 01/06/2019 at 11:52 AM  Between 7am to 6pm - Pager - [phone] After 6pm go to [email or web address] - password [email or web address] [facility]  Office  [phone]  CC: Primary care physician; [name], MD

## 2019-01-06 NOTE — Progress Notes (Signed)
Recheck Hgb 6.6. MD Gouru made aware.

## 2019-01-07 LAB — PREPARE RBC (CROSSMATCH)

## 2019-01-07 LAB — CBC
HCT: 21 % — ABNORMAL LOW (ref 39.0–52.0)
Hemoglobin: 6.6 g/dL — ABNORMAL LOW (ref 13.0–17.0)
MCH: 29.3 pg (ref 26.0–34.0)
MCHC: 31.4 g/dL (ref 30.0–36.0)
MCV: 93.3 fL (ref 80.0–100.0)
Platelets: 227 10*3/uL (ref 150–400)
RBC: 2.25 MIL/uL — ABNORMAL LOW (ref 4.22–5.81)
RDW: 14.9 % (ref 11.5–15.5)
WBC: 7.7 10*3/uL (ref 4.0–10.5)
nRBC: 0 % (ref 0.0–0.2)

## 2019-01-07 LAB — HEMOGLOBIN AND HEMATOCRIT, BLOOD
HCT: 23.1 % — ABNORMAL LOW (ref 39.0–52.0)
Hemoglobin: 7.2 g/dL — ABNORMAL LOW (ref 13.0–17.0)

## 2019-01-07 MED ORDER — SODIUM CHLORIDE 0.9% IV SOLUTION
Freq: Once | INTRAVENOUS | Status: AC
  Administered 2019-01-07: 10:00:00 via INTRAVENOUS

## 2019-01-07 NOTE — Progress Notes (Signed)
Notified Dr. Marcille Blanco of drop in Hgb to 6.6(3/22 @ 2am) from 7.1 at 1946(3/21). No new orders at this time. Pnt is not currently having bowel movements and no visible evidence of bleeding. Denies dizziness, shortness of breath. Will report to day nurse.

## 2019-01-07 NOTE — Progress Notes (Signed)
[facility] [place] NAME: Joaopedro Daoud    MR#:  [id number]  DATE OF BIRTH:  30-Oct-1960  SUBJECTIVE:  CHIEF COMPLAINT: Patient is reporting black tarry stool , hemoglobin is still dropping after 2 units of blood transfusion Denies any abdominal pain.  Received 2 units of blood transfusion  Gets hemodialysis on Monday Wednesday and Friday  REVIEW OF SYSTEMS:  CONSTITUTIONAL: No fever, fatigue or weakness.  EYES: No blurred or double vision.  EARS, NOSE, AND THROAT: No tinnitus or ear pain.  RESPIRATORY: No cough, shortness of breath, wheezing or hemoptysis.  CARDIOVASCULAR: No chest pain, orthopnea, edema.  GASTROINTESTINAL: No nausea, vomiting, diarrhea or abdominal pain.  Reports black tarry stool GENITOURINARY: No dysuria, hematuria.  ENDOCRINE: No polyuria, nocturia,  HEMATOLOGY: No anemia, easy bruising or bleeding SKIN: No rash or lesion. MUSCULOSKELETAL: No joint pain or arthritis.   NEUROLOGIC: No tingling, numbness, weakness.  PSYCHIATRY: No anxiety or depression.   DRUG ALLERGIES:   Allergies  Allergen Reactions  . Methotrexate Other (See Comments)    Blood count drops  . Vancomycin Shortness Of Breath    Eyes watering, SOB, wheezing  . Cefepime Other (See Comments)  . Tape     VITALS:  Blood pressure (!) 155/81, pulse 63, temperature 98.2 F (36.8 C), temperature source Oral, resp. rate 20, height 5' 11"  (1.803 m), weight 87 kg, SpO2 100 %.  PHYSICAL EXAMINATION:  GENERAL:  58 y.o.-year-old patient lying in the bed with no acute distress.  EYES: Pupils equal, round, reactive to light and accommodation. No scleral icterus. Extraocular muscles intact.  HEENT: Head atraumatic, normocephalic. Oropharynx and nasopharynx clear.  NECK:  Supple, no jugular venous distention. No thyroid enlargement, no tenderness.  LUNGS: Normal breath sounds bilaterally, no wheezing, rales,rhonchi or crepitation. No use of accessory  muscles of respiration.  CARDIOVASCULAR: S1, S2 normal. No murmurs, rubs, or gallops.  ABDOMEN: Soft, nontender, nondistended. Bowel sounds present.  EXTREMITIES: No pedal edema, cyanosis, or clubbing.  Bilateral BKA NEUROLOGIC: Awake alert and oriented x3 sensation intact. Gait not checked.  PSYCHIATRIC: The patient is alert and oriented x 3.  SKIN: No obvious rash, lesion, or ulcer.    LABORATORY PANEL:   CBC Recent Labs  Lab 01/07/19 0202 01/07/19 0906  WBC 7.7  --   HGB 6.6* 7.2*  HCT 21.0* 23.1*  PLT 227  --    ------------------------------------------------------------------------------------------------------------------  Chemistries  Recent Labs  Lab 01/05/19 1628  NA 136  K 3.2*  CL 98  CO2 29  GLUCOSE 175*  BUN 14  CREATININE 2.61*  CALCIUM 8.2*  AST 12*  ALT 10  ALKPHOS 86  BILITOT 0.3   ------------------------------------------------------------------------------------------------------------------  Cardiac Enzymes Recent Labs  Lab 01/05/19 1754  TROPONINI <0.03   ------------------------------------------------------------------------------------------------------------------  RADIOLOGY:  No results found.  EKG:   Orders placed or performed during the hospital encounter of 01/05/19  . ED EKG  . ED EKG    ASSESSMENT AND PLAN:   Roc Melaragno  is a 58 y.o. male with a known history of iron deficiency anemia, Crohn's disease, diabetes, end-stage renal disease on hemodialysis comes to the emergency room from dialysis after completion of with low hemoglobin. He was told his hemoglobin of 5.0  1. Acute on chronic anemia-patient is reporting melena -patient has iron deficiency anemia on iron pills -hemoglobin 5.4-5.8-1 unit blood transfusion -6.0-   2 unit blood transfusion so far ' hb 6.6-1 more unit of blood transfusion today -  heme occult stools negative in the ER -will repeat again in the lab -IV Protonix BID -holding aspirin and  Plavix -G.I. has seen the patient and patient refused any kind of procedures.  GI is recommending outpatient follow-up with gastroenterology and EGD and colonoscopy in the next 3 to 4 weeks as an outpatient -Monitor hemoglobin hematocrit closely  2. End-stage renal disease on hemodialysis  -continue in-house hemodialysis -Dr. Zollie Scale consulted.  Patient had hemodialysis yesterday on Friday Repeat hemodialysis in a.m.   3. Type II diabetes sliding scale insulin  4.  Hypokalemia Provide potassium supplements and monitor    DVT prophylaxis patient has amputation and with anemia will not be able to use antiplatelet agents and or SCD     All the records are reviewed and case discussed with Care Management/Social Workerr. Management plans discussed with the patient, he is  in agreement.  CODE STATUS: FC  TOTAL TIME TAKING CARE OF THIS PATIENT: 35 minutes.   POSSIBLE D/C IN 1-2  DAYS, DEPENDING ON CLINICAL CONDITION.  Note: This dictation was prepared with Dragon dictation along with smaller phrase technology. Any transcriptional errors that result from this process are unintentional.   Nicholes Mango M.D on 01/07/2019 at 1:43 PM  Between 7am to 6pm - Pager - [phone] After 6pm go to [email or web address] - password [email or web address] [facility]  Office  [phone]  CC: Primary care physician; [name], MD

## 2019-01-07 NOTE — Progress Notes (Signed)
[facility]  ROUNDING NOTE   Subjective:  Patient seen at bedside. Receiving another unit of blood today. Due for dialysis again tomorrow. Patient was offered EGD and colonoscopy but declined at this time.  Objective:  Vital signs in last 24 hours:  Temp:  [97.7 F (36.5 C)-98.9 F (37.2 C)] 98.2 F (36.8 C) (03/22 1137) Pulse Rate:  [63-77] 63 (03/22 1137) Resp:  [18-20] 20 (03/22 1052) BP: (122-156)/(70-82) 155/81 (03/22 1137) SpO2:  [100 %] 100 % (03/22 1026)  Weight change:  Filed Weights   01/05/19 1619  Weight: 87 kg    Intake/Output: I/O last 3 completed shifts: In: 093 [P.O.:240; I.V.:10; Blood:635] Out: 225 [Urine:225]   Intake/Output this shift:  No intake/output data recorded.  Physical Exam: General: No acute distress  Head: Normocephalic, atraumatic. Moist oral mucosal membranes  Eyes: Anicteric  Neck: Supple, trachea midline  Lungs:  Clear to auscultation, normal effort  Heart: S1S2 no rubs  Abdomen:  Soft, nontender, bowel sounds present  Extremities: No peripheral edema, b/l LE amputation  Neurologic: Awake, alert, following commands  Skin: No lesions  Access: LUE AVF    Basic Metabolic Panel: Recent Labs  Lab 01/05/19 1628  NA 136  K 3.2*  CL 98  CO2 29  GLUCOSE 175*  BUN 14  CREATININE 2.61*  CALCIUM 8.2*    Liver Function Tests: Recent Labs  Lab 01/05/19 1628  AST 12*  ALT 10  ALKPHOS 86  BILITOT 0.3  PROT 7.6  ALBUMIN 2.8*   No results for input(s): LIPASE, AMYLASE in the last 168 hours. No results for input(s): AMMONIA in the last 168 hours.  CBC: Recent Labs  Lab 01/05/19 1628  01/06/19 0641 01/06/19 1449 01/06/19 1946 01/07/19 0202 01/07/19 0906  WBC 8.0  --  6.2  --   --  7.7  --   NEUTROABS  --   --  4.1  --   --   --   --   HGB 5.4*   < > 6.0* 6.6* 7.1* 6.6* 7.2*  HCT 17.6*  --  19.9* 21.3* 22.5* 21.0* 23.1*  MCV 95.1  --  94.3  --   --  93.3  --   PLT 304  --  245  --   --  227  --    <  > = values in this interval not displayed.    Cardiac Enzymes: Recent Labs  Lab 01/05/19 1754  TROPONINI <0.03    BNP: Invalid input(s): POCBNP  CBG: No results for input(s): GLUCAP in the last 168 hours.  Microbiology: Results for orders placed or performed during the hospital encounter of 01/05/19  MRSA PCR Screening     Status: None   Collection Time: 01/05/19 10:36 PM  Result Value Ref Range Status   MRSA by PCR NEGATIVE NEGATIVE Final    Comment:        The GeneXpert MRSA Assay (FDA approved for NASAL specimens only), is one component of a comprehensive MRSA colonization surveillance program. It is not intended to diagnose MRSA infection nor to guide or monitor treatment for MRSA infections. Performed at [facility], [address] [place], [place] [address]     Coagulation Studies: Recent Labs    01/05/19 1742  LABPROT 13.0  INR 1.0    Urinalysis: No results for input(s): COLORURINE, LABSPEC, PHURINE, GLUCOSEU, HGBUR, BILIRUBINUR, KETONESUR, PROTEINUR, UROBILINOGEN, NITRITE, LEUKOCYTESUR in the last 72 hours.  Invalid input(s): APPERANCEUR    Imaging: No results  found.   Medications:    . sodium chloride   Intravenous Once  . atorvastatin  80 mg Oral Daily  . ferric citrate  420 mg Oral TID WC  . gabapentin  100 mg Oral BID  . insulin aspart  0-9 Units Subcutaneous TID WC  . levETIRAcetam  750 mg Oral QHS  . lisinopril  10 mg Oral Q T,Th,S,Su  . pantoprazole (PROTONIX) IV  40 mg Intravenous Q12H  . rifampin  300 mg Oral BID  . sevelamer carbonate  800 mg Oral TID WC   acetaminophen **OR** acetaminophen, amLODipine, ondansetron **OR** ondansetron (ZOFRAN) IV, polyethylene glycol  Assessment/ Plan:  58 y.o. male with ESRD on HD MWF followed by [facility] nephrology, diabetes mellitus type 2, history of DVT, right below the knee amputation, hypertension, history of intracerebral hemorrhage, anemia of chronic kidney disease, secondary  hyperparathyroidism, seizure disorder   [facility] nephrology/[address]/MWF.  1.  ESRD on HD MWF.  Patient had dialysis on Friday as an outpatient.  We will plan for hemodialysis again tomorrow.  Note indication for dialysis at the moment.  2.  Anemia of chronic kidney disease.  Patient has received multiple units of blood transfusion this admission.  He is currently receiving an additional unit at the moment.  Most recent hemoglobin was 7.2.  3.  Secondary hyperparathyroidism.  Check serum phosphorus tomorrow.  Otherwise continue the patient on current dosage of Auryxia.  4.  Diabetes mellitus type 2 with chronic kidney disease.  Insulin management as per hospitalist.   LOS: 2 Dandra Velardi 3/22/20201:21 PM

## 2019-01-08 LAB — BPAM RBC
Blood Product Expiration Date: 202004172359
Blood Product Expiration Date: 202004182359
Blood Product Expiration Date: 202004182359
ISSUE DATE / TIME: 202003201752
ISSUE DATE / TIME: 202003211040
ISSUE DATE / TIME: 202003221033
Unit Type and Rh: 7300
Unit Type and Rh: 7300
Unit Type and Rh: 7300

## 2019-01-08 LAB — GLUCOSE, CAPILLARY
Glucose-Capillary: 155 mg/dL — ABNORMAL HIGH (ref 70–99)
Glucose-Capillary: 143 mg/dL — ABNORMAL HIGH (ref 70–99)
Glucose-Capillary: 117 mg/dL — ABNORMAL HIGH (ref 70–99)
Glucose-Capillary: 196 mg/dL — ABNORMAL HIGH (ref 70–99)
Glucose-Capillary: 194 mg/dL — ABNORMAL HIGH (ref 70–99)
Glucose-Capillary: 256 mg/dL — ABNORMAL HIGH (ref 70–99)
Glucose-Capillary: 151 mg/dL — ABNORMAL HIGH (ref 70–99)
Glucose-Capillary: 180 mg/dL — ABNORMAL HIGH (ref 70–99)

## 2019-01-08 LAB — TYPE AND SCREEN
ABO/RH(D): B POS
Antibody Screen: NEGATIVE
Unit division: 0
Unit division: 0
Unit division: 0

## 2019-01-08 LAB — CBC
HCT: 24.1 % — ABNORMAL LOW (ref 39.0–52.0)
Hemoglobin: 7.6 g/dL — ABNORMAL LOW (ref 13.0–17.0)
MCH: 29.5 pg (ref 26.0–34.0)
MCHC: 31.5 g/dL (ref 30.0–36.0)
MCV: 93.4 fL (ref 80.0–100.0)
Platelets: 217 10*3/uL (ref 150–400)
RBC: 2.58 MIL/uL — ABNORMAL LOW (ref 4.22–5.81)
RDW: 15 % (ref 11.5–15.5)
WBC: 6.2 10*3/uL (ref 4.0–10.5)
nRBC: 0 % (ref 0.0–0.2)

## 2019-01-08 MED ORDER — PANTOPRAZOLE SODIUM 40 MG PO TBEC: 40.0000 mg | DELAYED_RELEASE_TABLET | Freq: Two times a day (BID) | ORAL | 0 refills | Status: DC

## 2019-01-08 MED ORDER — PANTOPRAZOLE SODIUM 40 MG PO TBEC: 40.0000 mg | DELAYED_RELEASE_TABLET | Freq: Two times a day (BID) | ORAL | Status: DC

## 2019-01-08 MED ORDER — EPOETIN ALFA 10000 UNIT/ML IJ SOLN
20000.0000 [IU] | INTRAMUSCULAR | Status: DC
  Administered 2019-01-08: 20000 [IU] via INTRAVENOUS

## 2019-01-08 NOTE — Discharge Instructions (Signed)
Follow up PCP/GI clinic to see if resume ASA and/or plavix.

## 2019-01-08 NOTE — Progress Notes (Signed)
Pre HD Tx Assessment   01/08/19 0915  Neurological  Level of Consciousness Alert  Orientation Level Oriented X4  Respiratory  Respiratory Pattern Regular  Chest Assessment Chest expansion symmetrical  Bilateral Breath Sounds Clear;Diminished  Cardiac  Pulse Regular  Heart Sounds S1, S2  ECG Monitor Yes  Vascular  R Radial Pulse +2  L Radial Pulse +2  R Dorsalis Pedis Pulse  (BKA)  L Dorsalis Pedis Pulse  (BKA)  Integumentary  Integumentary (WDL) X  Skin Color Appropriate for ethnicity  Skin Condition Dry  Skin Integrity MASD  Musculoskeletal  Musculoskeletal (WDL) X  Generalized Weakness Yes  GU Assessment  Genitourinary (WDL) X  Genitourinary Symptoms Oliguria  Psychosocial  Psychosocial (WDL) WDL

## 2019-01-08 NOTE — Progress Notes (Signed)
Pre HD Tx   01/08/19 0900  Vital Signs  Temp 98 F (36.7 C)  Temp Source Oral  Pulse Rate 60  Pulse Rate Source Monitor  Resp 19  BP (!) 156/78  BP Location Right Arm  BP Method Automatic  Patient Position (if appropriate) Lying  Oxygen Therapy  SpO2 100 %  O2 Device Room Air  Pain Assessment  Pain Scale 0-10  Pain Score 0  Dialysis Weight  Weight 88 kg  Type of Weight Pre-Dialysis  Time-Out for Hemodialysis  What Procedure? Hemodialysis  Pt Identifiers(min of two) First/Last Name;MRN/Account#  Correct Site? Yes  Correct Side? Yes  Correct Procedure? Yes  Consents Verified? Yes  Rad Studies Available? N/A  Safety Precautions Reviewed? Yes  Engineer, civil (consulting) Number 4  Station Number 4  UF/Alarm Test Passed  Conductivity: Meter 14  Conductivity: Machine  14  pH 7.4  Reverse Osmosis Main  Normal Saline Lot Number [id number]  Dialyzer Lot Number [id number]  Disposable Set Lot Number [id number]  Machine Temperature 98.6 F (37 C)  Musician and Audible Yes  Blood Lines Intact and Secured Yes  Pre Treatment Patient Checks  Vascular access used during treatment Fistula  Hepatitis B Surface Antigen Results Negative  Date Hepatitis B Surface Antigen Drawn 07/31/18  Hepatitis B Surface Antibody  (>10)  Date Hepatitis B Surface Antibody Drawn 07/31/18  Hemodialysis Consent Verified Yes  Hemodialysis Standing Orders Initiated Yes  ECG (Telemetry) Monitor On Yes  Prime Ordered Normal Saline  Length of  DialysisTreatment -hour(s) 3.5 Hour(s)  Dialysis Treatment Comments Na 140  Dialyzer Elisio 17H NR  Dialysate 2K, 2.5 Ca  Dialysate Flow Ordered 800  Blood Flow Rate Ordered 400 mL/min  Ultrafiltration Goal 1 Liters  Dialysis Blood Pressure Support Ordered Normal Saline  Education / Care Plan  Dialysis Education Provided Yes  Documented Education in Care Plan Yes  Fistula / Graft Left Upper arm Arteriovenous fistula  No Placement Date or Time found.   Placed  prior to admission: Yes  Orientation: Left  Access Location: Upper arm  Access Type: Arteriovenous fistula  Site Condition No complications  Fistula / Graft Assessment Present;Thrill;Bruit  Status Accessed  Needle Size 15  Drainage Description None     01/08/19 0900  Vital Signs  Temp 98 F (36.7 C)  Temp Source Oral  Pulse Rate 60  Pulse Rate Source Monitor  Resp 19  BP (!) 156/78  BP Location Right Arm  BP Method Automatic  Patient Position (if appropriate) Lying  Oxygen Therapy  SpO2 100 %  O2 Device Room Air  Pain Assessment  Pain Scale 0-10  Pain Score 0  Dialysis Weight  Weight 88 kg  Type of Weight Pre-Dialysis  Time-Out for Hemodialysis  What Procedure? Hemodialysis  Pt Identifiers(min of two) First/Last Name;MRN/Account#  Correct Site? Yes  Correct Side? Yes  Correct Procedure? Yes  Consents Verified? Yes  Rad Studies Available? N/A  Safety Precautions Reviewed? Yes  Engineer, civil (consulting) Number 4  Station Number 4  UF/Alarm Test Passed  Conductivity: Meter 14  Conductivity: Machine  14  pH 7.4  Reverse Osmosis Main  Normal Saline Lot Number [id number]  Dialyzer Lot Number [id number]  Disposable Set Lot Number [id number]  Machine Temperature 98.6 F (37 C)  Musician and Audible Yes  Blood Lines Intact and Secured Yes  Pre Treatment Patient Checks  Vascular access used during treatment Fistula  Hepatitis B Surface Antigen Results Negative  Date Hepatitis B Surface Antigen Drawn 07/31/18  Hepatitis B Surface Antibody  (>10)  Date Hepatitis B Surface Antibody Drawn 07/31/18  Hemodialysis Consent Verified Yes  Hemodialysis Standing Orders Initiated Yes  ECG (Telemetry) Monitor On Yes  Prime Ordered Normal Saline  Length of  DialysisTreatment -hour(s) 3.5 Hour(s)  Dialysis Treatment Comments Na 140  Dialyzer Elisio 17H NR  Dialysate 2K, 2.5 Ca  Dialysate Flow Ordered 800  Blood Flow Rate Ordered 400 mL/min  Ultrafiltration Goal 1 Liters   Dialysis Blood Pressure Support Ordered Normal Saline  Education / Care Plan  Dialysis Education Provided Yes  Documented Education in Care Plan Yes  Fistula / Graft Left Upper arm Arteriovenous fistula  No Placement Date or Time found.   Placed prior to admission: Yes  Orientation: Left  Access Location: Upper arm  Access Type: Arteriovenous fistula  Site Condition No complications  Fistula / Graft Assessment Present;Thrill;Bruit  Status Accessed  Needle Size 15  Drainage Description None

## 2019-01-08 NOTE — Discharge Summary (Signed)
[name] at [place] NAME: [name]    MR#:  [id number]  DATE OF BIRTH:  23-Apr-1961  DATE OF ADMISSION:  01/05/2019   ADMITTING PHYSICIAN: Fritzi Mandes, MD  DATE OF DISCHARGE: 01/08/2019  PRIMARY CARE PHYSICIAN: [name], MD   ADMISSION DIAGNOSIS:  Black stool [K92.1] Symptomatic anemia [D64.9] DISCHARGE DIAGNOSIS:  Active Problems:   Anemia  SECONDARY DIAGNOSIS:   Past Medical History:  Diagnosis Date  . Anemia   . Crohn disease ([place])   . Diabetes mellitus without complication ([place])   . DVT of lower extremity (deep venous thrombosis) ([place]) 2016  . Hidradenitis suppurativa   . Hypertension   . ICH (intracerebral hemorrhage) ([place])   . Peritonitis ([place]) 04/21/2017  . Pyogenic arthritis of knee ([place]) 02/04/2016  . Renal disorder   . Sepsis ([place]) 01/12/2018   HOSPITAL COURSE:  MarlonMarrowis a58 y.o.malewith a known history of iron deficiency anemia, Crohn's disease, diabetes, end-stage renal disease on hemodialysis comes to the emergency room from dialysis after completion of with low hemoglobin. He was told his hemoglobin of5.0  1.Acute on chronic anemia-patient is reporting melena -patient has iron deficiency anemia on iron pills -hemoglobin 5.4-5.8-1 unit blood transfusion -6.0-   2 unit blood transfusion so far ' hb 6.6-1 more unit of blood transfusion today -heme occult stools negative in the ER -will repeat again in the lab -IV Protonix BID, change to po. -holding aspirin and Plavix -G.I. has seen the patient and patient refused any kind of procedures.  GI is recommending outpatient follow-up with gastroenterology and EGD and colonoscopy in the next 3 to 4 weeks as an outpatient. The patient has no melena or bloody stool.  He denies any symptoms.  He wants to go home.  He needs to follow-up PCP or GI physician for resuming aspirin and Plavix.  2.End-stage renal disease on hemodialysis  -continue  in-house hemodialysis.  Patient has hemodialysis today.   3.Type II diabetes sliding scale insulin  4.  Hypokalemia Provided potassium supplements and follow-up BMP as outpatient. I discussed with Dr. Juleen China. DISCHARGE CONDITIONS:  Stable, discharge to home today. CONSULTS OBTAINED:  Treatment Team:  Anthonette Legato, MD DRUG ALLERGIES:   Allergies  Allergen Reactions  . Methotrexate Other (See Comments)    Blood count drops  . Vancomycin Shortness Of Breath    Eyes watering, SOB, wheezing  . Cefepime Other (See Comments)  . Tape    DISCHARGE MEDICATIONS:   Allergies as of 01/08/2019      Reactions   Methotrexate Other (See Comments)   Blood count drops   Vancomycin Shortness Of Breath   Eyes watering, SOB, wheezing   Cefepime Other (See Comments)   Tape       Medication List    STOP taking these medications   aspirin EC 81 MG tablet   clopidogrel 75 MG tablet Commonly known as:  Plavix     TAKE these medications   Alcohol Swabs Pads Use as directed to check blood sugar three times daily for insulin dependent type 2 diabetes.   amLODipine 10 MG tablet Commonly known as:  NORVASC TAKE 1 TABLET BY MOUTH ONCE DAILY AS NEEDED   atorvastatin 80 MG tablet Commonly known as:  LIPITOR TAKE 1 TABLET BY MOUTH ONCE DAILY   Auryxia 1 GM 210 MG(Fe) tablet Generic drug:  ferric citrate Take 420 mg by mouth 3 (three) times daily.   doxycycline 100 MG capsule Commonly known  as:  MONODOX Take 100 mg by mouth 2 (two) times daily.   FreeStyle [employer] 1 Units by Does not apply route daily. USE TO CHECK BLOOD SUGAR UP TO FOUR TIMES A DAY AS DIRECTED. DX INSULIN DEPENDENT DIABETES   FreeStyle Libre Sensor System Misc Use to check blood sugar four times daily for insulin dependent diabetes.   furosemide 80 MG tablet Commonly known as:  LASIX Take 1 tablet (80 mg total) by mouth 2 (two) times daily.   gabapentin 100 MG capsule Commonly known as:   NEURONTIN Take 1 capsule (100 mg total) by mouth 2 (two) times daily.   glucose blood test strip Commonly known as:  ONE TOUCH ULTRA TEST Use as directed to check blood sugar three times daily. E11.9   [name] Pen 40 MG/0.4ML Pnkt Generic drug:  Adalimumab Inject 40 mg into the muscle once a week.   insulin glargine 100 UNIT/ML injection Commonly known as:  LANTUS Inject as directed by physician up to 20 units daily   Insulin Pen Needle 32G X 6 MM Misc Use to inject insulin via pen up to three times daily for insulin dependent type 2 diabetes.   levETIRAcetam 750 MG tablet Commonly known as:  KEPPRA Take 750 mg by mouth at bedtime.   lidocaine-prilocaine cream Commonly known as:  EMLA Apply 1 application topically as needed.   NovoLOG FlexPen 100 UNIT/ML FlexPen Generic drug:  insulin aspart INJECT 15 UNITS SUBCUTANEOUSLY THREE TIMES DAILY What changed:  See the new instructions.   ONE TOUCH LANCETS Misc Use as directed to check blood sugar three times daily. E11.9   ONE TOUCH ULTRA 2 w/Device Kit Use as directed to check blood sugar three times daily. E11.9   pantoprazole 40 MG tablet Commonly known as:  PROTONIX Take 1 tablet (40 mg total) by mouth 2 (two) times daily before a meal.   rifampin 300 MG capsule Commonly known as:  RIFADIN Take 300 mg by mouth 2 (two) times daily.   sevelamer carbonate 800 MG tablet Commonly known as:  RENVELA Take 1 tablet (800 mg total) by mouth 3 (three) times daily.   triamcinolone ointment 0.1 % Commonly known as:  KENALOG Apply 1 application topically as needed.        DISCHARGE INSTRUCTIONS:  See AVS.  If you experience worsening of your admission symptoms, develop shortness of breath, life threatening emergency, suicidal or homicidal thoughts you must seek medical attention immediately by calling 911 or calling your MD immediately  if symptoms less severe.  You Must read complete instructions/literature along with  all the possible adverse reactions/side effects for all the Medicines you take and that have been prescribed to you. Take any new Medicines after you have completely understood and accpet all the possible adverse reactions/side effects.   Please note  You were cared for by a hospitalist during your hospital stay. If you have any questions about your discharge medications or the care you received while you were in the hospital after you are discharged, you can call the unit and asked to speak with the hospitalist on call if the hospitalist that took care of you is not available. Once you are discharged, your primary care physician will handle any further medical issues. Please note that NO REFILLS for any discharge medications will be authorized once you are discharged, as it is imperative that you return to your primary care physician (or establish a relationship with a primary care physician if you do  not have one) for your aftercare needs so that they can reassess your need for medications and monitor your lab values.    On the day of Discharge:  VITAL SIGNS:  Blood pressure (!) 146/79, pulse 62, temperature 98.5 F (36.9 C), temperature source Oral, resp. rate 18, height 5' 11"  (1.803 m), weight 87 kg, SpO2 100 %. PHYSICAL EXAMINATION:  GENERAL:  58 y.o.-year-old patient lying in the bed with no acute distress.  EYES: Pupils equal, round, reactive to light and accommodation. No scleral icterus. Extraocular muscles intact.  HEENT: Head atraumatic, normocephalic. Oropharynx and nasopharynx clear.  NECK:  Supple, no jugular venous distention. No thyroid enlargement, no tenderness.  LUNGS: Normal breath sounds bilaterally, no wheezing, rales,rhonchi or crepitation. No use of accessory muscles of respiration.  CARDIOVASCULAR: S1, S2 normal. No murmurs, rubs, or gallops.  ABDOMEN: Soft, non-tender, non-distended. Bowel sounds present. No organomegaly or mass.  EXTREMITIES: No pedal edema, cyanosis,  or clubbing.  NEUROLOGIC: Cranial nerves II through XII are intact. Muscle strength 5/5 in all extremities. Sensation intact. Gait not checked.  PSYCHIATRIC: The patient is alert and oriented x 3.  SKIN: No obvious rash, lesion, or ulcer.  DATA REVIEW:   CBC Recent Labs  Lab 01/08/19 0516  WBC 6.2  HGB 7.6*  HCT 24.1*  PLT 217    Chemistries  Recent Labs  Lab 01/05/19 1628  NA 136  K 3.2*  CL 98  CO2 29  GLUCOSE 175*  BUN 14  CREATININE 2.61*  CALCIUM 8.2*  AST 12*  ALT 10  ALKPHOS 96  BILITOT 0.3     Microbiology Results  Results for orders placed or performed during the hospital encounter of 01/05/19  MRSA PCR Screening     Status: None   Collection Time: 01/05/19 10:36 PM  Result Value Ref Range Status   MRSA by PCR NEGATIVE NEGATIVE Final    Comment:        The GeneXpert MRSA Assay (FDA approved for NASAL specimens only), is one component of a comprehensive MRSA colonization surveillance program. It is not intended to diagnose MRSA infection nor to guide or monitor treatment for MRSA infections. Performed at [facility], [address] [place], [place] [address]     RADIOLOGY:  No results found.   Management plans discussed with the patient, family and they are in agreement.  CODE STATUS: Full Code   TOTAL TIME TAKING CARE OF THIS PATIENT: 33 minutes.    Demetrios Loll M.D on 01/08/2019 at 1:10 PM  Between 7am to 6pm - Pager - [phone]  After 6pm go to [email or web address] - Proofreader  [facility]  Office  [phone]  CC: Primary care physician; [name], MD   Note: This dictation was prepared with Dragon dictation along with smaller phrase technology. Any transcriptional errors that result from this process are unintentional.

## 2019-01-08 NOTE — Progress Notes (Signed)
HD Tx Start   01/08/19 0930  Vital Signs  Pulse Rate 65  Pulse Rate Source Monitor  Resp 17  BP (!) 157/75  BP Location Right Arm  Oxygen Therapy  SpO2 100 %  During Hemodialysis Assessment  Blood Flow Rate (mL/min) 400 mL/min  Arterial Pressure (mmHg) -180 mmHg  Venous Pressure (mmHg) 210 mmHg  Transmembrane Pressure (mmHg) 50 mmHg  Ultrafiltration Rate (mL/min) 460 mL/min (460 mL per HOUR removal)  Dialysate Flow Rate (mL/min) 800 ml/min  Conductivity: Machine  13.8  HD Safety Checks Performed Yes  Dialysis Fluid Bolus Normal Saline  Bolus Amount (mL) 250 mL  Intra-Hemodialysis Comments Tx initiated

## 2019-01-08 NOTE — Progress Notes (Signed)
Patient arrived back from HD, he stated he would take his medications when he got home with his wife and eat lunch there.   His discharge instructions were given to him, along with his prescription. He verbalized understanding. PIV x1 was removed and he dressed himself and applied his bilateral prosthetics independently.  Patient discharged home with wife via wheelchair.

## 2019-01-08 NOTE — Progress Notes (Signed)
[facility]  ROUNDING NOTE   Subjective:   Seen and examined on hemodialysis treatment. Tolerating treatment well.     HEMODIALYSIS FLOWSHEET:  Blood Flow Rate (mL/min): 400 mL/min Arterial Pressure (mmHg): -190 mmHg Venous Pressure (mmHg): 230 mmHg Transmembrane Pressure (mmHg): 40 mmHg Ultrafiltration Rate (mL/min): 460 mL/min Dialysate Flow Rate (mL/min): 800 ml/min Conductivity: Machine : 13.8 Conductivity: Machine : 13.8 Dialysis Fluid Bolus: Normal Saline Bolus Amount (mL): 250 mL    Objective:  Vital signs in last 24 hours:  Temp:  [98 F (36.7 C)-99.2 F (37.3 C)] 98 F (36.7 C) (03/23 0900) Pulse Rate:  [57-71] 62 (03/23 1215) Resp:  [11-20] 11 (03/23 1215) BP: (136-171)/(70-88) 141/80 (03/23 1215) SpO2:  [99 %-100 %] 100 % (03/23 1215) Weight:  [88 kg] 88 kg (03/23 0900)  Weight change:  Filed Weights   01/05/19 1619 01/08/19 0900  Weight: 87 kg 88 kg    Intake/Output: I/O last 3 completed shifts: In: 240 [P.O.:240] Out: 200 [Urine:200]   Intake/Output this shift:  Total I/O In: 240 [P.O.:240] Out: -   Physical Exam: General: No acute distress  Head: Normocephalic, atraumatic. Moist oral mucosal membranes  Eyes: Anicteric  Neck: Supple, trachea midline  Lungs:  Clear to auscultation, normal effort  Heart: regular  Abdomen:  Soft, mild tenderness to palpation.   Extremities: Bilateral BKA  Neurologic: Awake, alert, oriented  Skin: No lesions  Access: LUE AVF    Basic Metabolic Panel: Recent Labs  Lab 01/05/19 1628  NA 136  K 3.2*  CL 98  CO2 29  GLUCOSE 175*  BUN 14  CREATININE 2.61*  CALCIUM 8.2*    Liver Function Tests: Recent Labs  Lab 01/05/19 1628  AST 12*  ALT 10  ALKPHOS 86  BILITOT 0.3  PROT 7.6  ALBUMIN 2.8*   No results for input(s): LIPASE, AMYLASE in the last 168 hours. No results for input(s): AMMONIA in the last 168 hours.  CBC: Recent Labs  Lab 01/05/19 1628  01/06/19 0641  01/06/19 1449 01/06/19 1946 01/07/19 0202 01/07/19 0906 01/08/19 0516  WBC 8.0  --  6.2  --   --  7.7  --  6.2  NEUTROABS  --   --  4.1  --   --   --   --   --   HGB 5.4*   < > 6.0* 6.6* 7.1* 6.6* 7.2* 7.6*  HCT 17.6*  --  19.9* 21.3* 22.5* 21.0* 23.1* 24.1*  MCV 95.1  --  94.3  --   --  93.3  --  93.4  PLT 304  --  245  --   --  227  --  217   < > = values in this interval not displayed.    Cardiac Enzymes: Recent Labs  Lab 01/05/19 1754  TROPONINI <0.03    BNP: Invalid input(s): POCBNP  CBG: Recent Labs  Lab 01/06/19 1138 01/06/19 1704 01/06/19 2059 01/07/19 2113 01/08/19 0726  GLUCAP 117* 196* 194* 256* 151*    Microbiology: Results for orders placed or performed during the hospital encounter of 01/05/19  MRSA PCR Screening     Status: None   Collection Time: 01/05/19 10:36 PM  Result Value Ref Range Status   MRSA by PCR NEGATIVE NEGATIVE Final    Comment:        The GeneXpert MRSA Assay (FDA approved for NASAL specimens only), is one component of a comprehensive MRSA colonization surveillance program. It is not intended to diagnose MRSA  infection nor to guide or monitor treatment for MRSA infections. Performed at [facility], [address] [place], [place] [address]     Coagulation Studies: Recent Labs    01/05/19 1742  LABPROT 13.0  INR 1.0    Urinalysis: No results for input(s): COLORURINE, LABSPEC, PHURINE, GLUCOSEU, HGBUR, BILIRUBINUR, KETONESUR, PROTEINUR, UROBILINOGEN, NITRITE, LEUKOCYTESUR in the last 72 hours.  Invalid input(s): APPERANCEUR    Imaging: No results found.   Medications:    . sodium chloride   Intravenous Once  . atorvastatin  80 mg Oral Daily  . ferric citrate  420 mg Oral TID WC  . gabapentin  100 mg Oral BID  . insulin aspart  0-9 Units Subcutaneous TID WC  . levETIRAcetam  750 mg Oral QHS  . lisinopril  10 mg Oral Q T,Th,S,Su  . pantoprazole  40 mg Oral BID AC  . rifampin  300 mg Oral BID   . sevelamer carbonate  800 mg Oral TID WC   acetaminophen **OR** acetaminophen, amLODipine, ondansetron **OR** ondansetron (ZOFRAN) IV, polyethylene glycol  Assessment/ Plan:  [name] is a 58 y.o. black male with ESRD on HD MWF followed by [facility] nephrology, diabetes mellitus type 2, history of DVT, bilateral below the knee amputations, hypertension, history of intracerebral hemorrhage, anemia of chronic kidney disease, secondary hyperparathyroidism, seizure disorder   [facility] nephrology/[address]/MWF.  1.  ESRD on HD MWF.  Seen and examined on hemodialysis. Tolerating treatment well.   2.  Anemia of chronic kidney disease.  Status post PRBC transfusions on 3/20, 3/21 - EPO with HD treatment  3.  Secondary hyperparathyroidism.   [name].  4.  Hypertension:  - lisinopril   LOS: 3 Aryona Sill 3/23/202012:27 PM

## 2019-01-09 LAB — PARATHYROID HORMONE, INTACT (NO CA): PTH: 283 pg/mL — ABNORMAL HIGH (ref 15–65)

## 2019-01-10 ENCOUNTER — Inpatient Hospital Stay: Payer: Medicare Other | Admitting: Family Medicine

## 2019-01-27 ENCOUNTER — Other Ambulatory Visit: Payer: Self-pay | Admitting: Family Medicine

## 2019-01-29 ENCOUNTER — Other Ambulatory Visit: Payer: Self-pay | Admitting: Family Medicine

## 2019-01-29 MED ORDER — GABAPENTIN 100 MG PO CAPS: 100.0000 mg | ORAL_CAPSULE | Freq: Two times a day (BID) | ORAL | 2 refills | Status: DC

## 2019-01-29 NOTE — Telephone Encounter (Signed)
[facility] faxed refill request for the following medications:  gabapentin (NEURONTIN) 100 MG capsule  90 day supply  Please advise. Thanks TNP

## 2019-02-16 ENCOUNTER — Other Ambulatory Visit: Payer: Self-pay

## 2019-02-16 DIAGNOSIS — E1129 Type 2 diabetes mellitus with other diabetic kidney complication: Secondary | ICD-10-CM

## 2019-02-16 NOTE — Telephone Encounter (Signed)
[name] [phone]. Would to speak with you about husband's medications and an order for a new wheelchair.

## 2019-02-19 NOTE — Telephone Encounter (Signed)
[name] states pt needs a new wheelchair and would like an order for it.  Will they need an appointment to document the need for one?  Can it be a virtual visit?   Pt also needs refills on Novolog and Lantus.  They are requesting Lantus Flexpen instead of the vial.  Please send to [facility] in [place].   Thanks,   -Mickel Baas

## 2019-02-22 MED ORDER — INSULIN ASPART 100 UNIT/ML FLEXPEN: PEN_INJECTOR | SUBCUTANEOUS | 3 refills | Status: DC

## 2019-03-02 ENCOUNTER — Other Ambulatory Visit
Admission: RE | Admit: 2019-03-02 | Discharge: 2019-03-02 | Disposition: A | Discharge status: Home / Self Care | Payer: Medicare Other | Source: Ambulatory Visit | Attending: Nephrology | Admitting: Nephrology

## 2019-03-02 DIAGNOSIS — N186 End stage renal disease: Secondary | ICD-10-CM | POA: Insufficient documentation

## 2019-03-02 LAB — HEMOGLOBIN AND HEMATOCRIT, BLOOD
HCT: 24.9 % — ABNORMAL LOW (ref 39.0–52.0)
Hemoglobin: 7.7 g/dL — ABNORMAL LOW (ref 13.0–17.0)

## 2019-04-28 ENCOUNTER — Other Ambulatory Visit: Payer: Self-pay | Admitting: Family Medicine

## 2019-06-19 HISTORY — PX: AMPUTATION FINGER: SHX6594

## 2019-07-06 ENCOUNTER — Other Ambulatory Visit: Payer: Self-pay | Admitting: Family Medicine

## 2019-07-06 DIAGNOSIS — Z8673 Personal history of transient ischemic attack (TIA), and cerebral infarction without residual deficits: Secondary | ICD-10-CM

## 2019-07-06 DIAGNOSIS — Z125 Encounter for screening for malignant neoplasm of prostate: Secondary | ICD-10-CM

## 2019-07-06 DIAGNOSIS — Z0000 Encounter for general adult medical examination without abnormal findings: Secondary | ICD-10-CM

## 2019-07-06 MED ORDER — ATORVASTATIN CALCIUM 80 MG PO TABS: 80.0000 mg | ORAL_TABLET | Freq: Every day | ORAL | 3 refills | Status: DC

## 2019-07-19 ENCOUNTER — Other Ambulatory Visit: Payer: Self-pay

## 2019-07-19 MED ORDER — AMLODIPINE BESYLATE 10 MG PO TABS: 10.0000 mg | ORAL_TABLET | Freq: Every day | ORAL | 0 refills | Status: DC | PRN

## 2019-07-19 NOTE — Telephone Encounter (Signed)
Please advise 

## 2019-07-19 NOTE — Telephone Encounter (Signed)
This is [name]'s patient. Patient's wife [name] called stating that patient has been out of his blood pressure medication for 2 days. Last office visit was over a year ago. The pharmacy has refills remaining on current prescription, but patient's insurance requires a 90 day supply instead for 30 day supply. Patient has an appointment to follow up with Dr. Caryn Section on 07/24/2019 at 2:40pm. Please review 90 day supply refill request for Amlodipine. Patient has been out for 2 days.   [facility]

## 2019-07-24 ENCOUNTER — Other Ambulatory Visit: Payer: Self-pay | Admitting: Family Medicine

## 2019-07-24 ENCOUNTER — Encounter: Payer: Self-pay | Admitting: Family Medicine

## 2019-07-24 ENCOUNTER — Other Ambulatory Visit: Payer: Self-pay

## 2019-07-24 ENCOUNTER — Ambulatory Visit (INDEPENDENT_AMBULATORY_CARE_PROVIDER_SITE_OTHER): Payer: Medicare Other | Admitting: Family Medicine

## 2019-07-24 VITALS — BP 121/68 | HR 68 | Temp 97.3°F | Resp 15 | Wt 156.4 lb

## 2019-07-24 DIAGNOSIS — Z89511 Acquired absence of right leg below knee: Secondary | ICD-10-CM

## 2019-07-24 DIAGNOSIS — Z992 Dependence on renal dialysis: Secondary | ICD-10-CM

## 2019-07-24 DIAGNOSIS — N186 End stage renal disease: Secondary | ICD-10-CM

## 2019-07-24 DIAGNOSIS — I10 Essential (primary) hypertension: Secondary | ICD-10-CM | POA: Diagnosis not present

## 2019-07-24 DIAGNOSIS — I739 Peripheral vascular disease, unspecified: Secondary | ICD-10-CM

## 2019-07-24 DIAGNOSIS — Z794 Long term (current) use of insulin: Secondary | ICD-10-CM | POA: Diagnosis not present

## 2019-07-24 DIAGNOSIS — Z89512 Acquired absence of left leg below knee: Secondary | ICD-10-CM

## 2019-07-24 DIAGNOSIS — E1129 Type 2 diabetes mellitus with other diabetic kidney complication: Secondary | ICD-10-CM | POA: Diagnosis not present

## 2019-07-24 LAB — POCT GLYCOSYLATED HEMOGLOBIN (HGB A1C): Hemoglobin A1C: 7.8 % — AB (ref 4.0–5.6)

## 2019-07-24 MED ORDER — LANTUS SOLOSTAR 100 UNIT/ML ~~LOC~~ SOPN: 10.0000 [IU] | PEN_INJECTOR | Freq: Every evening | SUBCUTANEOUS | 3 refills | Status: DC

## 2019-07-24 NOTE — Patient Instructions (Addendum)
.   Please review the attached list of medications and notify my office if there are any errors.   . Please bring all of your medications to every appointment so we can make sure that our medication list is the same as yours.   . It is especially important to get the annual flu vaccine this year. If you haven't had it already, please go to your pharmacy or call the office as soon as possible to schedule you flu shot.   Start taking Lantus 10 units every evening    Check blood sugar before each meal      If blood sugar is >=150, but <200, take 5 units Novolog insulin before eating     If blood sugar is >=200, but <250, take 6 units Novolog insulin     If blood sugar is >=250, but <300, take 7 units Novolog insulin     If blood sugar is >=300, but <350, take 8 units Novolog insulin     If blood sugar is >=350, but <400  take 9 units Novolog insulin     If blood sugar is >=400  take 10 units Novolog insulin      Please record all blood sugars and bring record to your next appointment.

## 2019-07-24 NOTE — Progress Notes (Signed)
Patient: [name] Male    DOB: July 23, 1961   58 y.o.   MRN: [id number] Visit Date: 07/24/2019  Today's Provider: [name], MD   Chief Complaint  Patient presents with  . Hypertension  . Diabetes   Subjective:     HPI  Diabetes Mellitus Type II, Follow-up:   Lab Results  Component Value Date   HGBA1C 7.8 (A) 07/24/2019   HGBA1C 6.3 (A) 07/25/2018   HGBA1C 7.0 01/31/2018    Last seen for diabetes 11 months ago.  Management since then includes none. He reports excellent compliance with treatment. He is not having side effects.  Current symptoms include none and have been stable. Home blood sugar records: 120-140  Episodes of hypoglycemia? no   Current Insulin regimen: Lantus  7-10 Units daily and Novolog 5units before meals  Most Recent Eye Exam:10/20/2017 Weight trend: stable Prior visit with dietician: No Current exercise: none Current diet habits: in general, an "unhealthy" diet, on average, 2 meals per day  Pertinent Labs:    Component Value Date/Time   CHOL 117 12/01/2017 1158   TRIG 79 12/01/2017 1158   HDL 48 12/01/2017 1158   LDLCALC 53 12/01/2017 1158   CREATININE 2.61 (H) 01/05/2019 1628   CREATININE 4.95 (H) 07/24/2014 1524    Wt Readings from Last 3 Encounters:  01/08/19 191 lb 12.8 oz (87 kg)  06/05/18 200 lb (90.7 kg)  12/10/17 190 lb 0.6 oz (86.2 kg)    ------------------------------------------------------------------------  Hypertension, follow-up:  BP Readings from Last 3 Encounters:  01/08/19 (!) 153/81  07/25/18 124/64  06/05/18 121/74    He was last seen for hypertension >12 months ago.   Management changes since that visit include continue the same medications. He reports excellent compliance with treatment. He is not having side effects.  He is not exercising. He is not adherent to low salt diet.   Outside blood pressures are systolic in the 081K and diastolic 75. He is experiencing none.  Patient  denies chest pain, chest pressure/discomfort, claudication, dyspnea, exertional chest pressure/discomfort, fatigue, irregular heart beat, lower extremity edema, near-syncope, orthopnea, palpitations, paroxysmal nocturnal dyspnea, syncope and tachypnea.   Cardiovascular risk factors include advanced age (older than 58 for men, 57 for women), diabetes mellitus, hypertension and male gender.  Use of agents associated with hypertension: none.     Weight trend: stable Wt Readings from Last 3 Encounters:  01/08/19 191 lb 12.8 oz (87 kg)  06/05/18 200 lb (90.7 kg)  12/10/17 190 lb 0.6 oz (86.2 kg)    Current diet: in general, an "unhealthy" diet  ------------------------------------------------------------------------  Allergies  Allergen Reactions  . Methotrexate Other (See Comments)    Blood count drops  . Vancomycin Shortness Of Breath    Eyes watering, SOB, wheezing  . Cefepime Other (See Comments)  . Tape      Current Outpatient Medications:  .  Adalimumab ([name] PEN) 40 MG/0.4ML PNKT, Inject 40 mg into the muscle once a week., Disp: , Rfl:  .  Alcohol Swabs PADS, Use as directed to check blood sugar three times daily for insulin dependent type 2 diabetes., Disp: 100 each, Rfl: 12 .  amLODipine (NORVASC) 10 MG tablet, Take 1 tablet (10 mg total) by mouth daily as needed., Disp: 90 tablet, Rfl: 0 .  atorvastatin (LIPITOR) 80 MG tablet, Take 1 tablet (80 mg total) by mouth daily., Disp: 90 tablet, Rfl: 3 .  AURYXIA 1 GM 210 MG(Fe) tablet, Take 420  mg by mouth 3 (three) times daily., Disp: , Rfl:  .  Blood Glucose Monitoring Suppl (ONE TOUCH ULTRA 2) w/Device KIT, Use as directed to check blood sugar three times daily. E11.9, Disp: 1 each, Rfl: 0 .  Continuous Blood Gluc Receiver (FREESTYLE LIBRE READER) DEVI, 1 Units by Does not apply route daily. USE TO CHECK BLOOD SUGAR UP TO FOUR TIMES A DAY AS DIRECTED. DX INSULIN DEPENDENT DIABETES, Disp: 1 Device, Rfl: 1 .  Continuous Blood Gluc  Sensor ([place]) MISC, Use to check blood sugar four times daily for insulin dependent diabetes., Disp: 1 each, Rfl: 12 .  furosemide (LASIX) 80 MG tablet, Take 1 tablet (80 mg total) by mouth 2 (two) times daily., Disp: 180 tablet, Rfl: 3 .  gabapentin (NEURONTIN) 100 MG capsule, Take 1 capsule (100 mg total) by mouth 2 (two) times daily., Disp: 180 capsule, Rfl: 2 .  glucose blood (ONE TOUCH ULTRA TEST) test strip, Use as directed to check blood sugar three times daily. E11.9, Disp: 100 each, Rfl: 12 .  insulin aspart (NOVOLOG FLEXPEN) 100 UNIT/ML FlexPen, Inject up to 15 units before meals as directed by physician, Disp: 15 mL, Rfl: 3 .  insulin glargine (LANTUS) 100 UNIT/ML injection, Inject as directed by physician up to 20 units daily, Disp: 10 mL, Rfl: 5 .  Insulin Pen Needle 32G X 6 MM MISC, Use to inject insulin via pen up to three times daily for insulin dependent type 2 diabetes., Disp: 100 each, Rfl: 12 .  levETIRAcetam (KEPPRA) 250 MG tablet, TK 1 T PO BID, Disp: , Rfl:  .  lidocaine-prilocaine (EMLA) cream, Apply 1 application topically as needed., Disp: , Rfl:  .  lisinopril (ZESTRIL) 10 MG tablet, TAKE 1 TABLET BY MOUTH ONCE DAILY ON TUESDAY, THURSDAY, SATURDAY, AND SUNDAY-NON DIALYSIS DAYS., Disp: 30 tablet, Rfl: 12 .  ONE TOUCH LANCETS MISC, Use as directed to check blood sugar three times daily. E11.9, Disp: 100 each, Rfl: 11 .  sevelamer carbonate (RENVELA) 800 MG tablet, Take 1 tablet (800 mg total) by mouth 3 (three) times daily., Disp: 270 tablet, Rfl: 3 .  triamcinolone ointment (KENALOG) 0.1 %, Apply 1 application topically as needed. , Disp: , Rfl: 0  Review of Systems  Social History   Tobacco Use  . Smoking status: Never Smoker  . Smokeless tobacco: Never Used  Substance Use Topics  . Alcohol use: No      Objective:   BP 121/68   Pulse 68   Temp (!) 97.3 F (36.3 C) (Oral)   Resp 15   Wt 156 lb 6.4 oz (70.9 kg)   BMI 21.81 kg/m     Physical Exam   General Appearance:    Well developed, well nourished male in no acute distress  Eyes:    PERRL, conjunctiva/corneas clear, EOM's intact       Lungs:     Clear to auscultation bilaterally, respirations unlabored  Heart:    Normal heart rate. Normal rhythm. No murmurs, rubs, or gallops.   MS:   Below knee amputation of both lower extremities is noted. Left finger amputation noted.   Neurologic:   Awake, alert, oriented x 3. No apparent focal neurological           defect.         Results for orders placed or performed in visit on 07/24/19  POCT glycosylated hemoglobin (Hb A1C)  Result Value Ref Range   Hemoglobin A1C 7.8 (A)  4.0 - 5.6 %   HbA1c POC (<> result, manual entry)     HbA1c, POC (prediabetic range)     HbA1c, POC (controlled diabetic range)         Assessment & Plan    1. Type 2 diabetes mellitus with other diabetic kidney complication, with long-term current use of insulin ([place]) Counseled that Lantus is a basal insulin and should take the same dose every day. He would like to change to Pen.  - Insulin Glargine (LANTUS SOLOSTAR) 100 UNIT/ML Solostar Pen; Inject 10 Units into the skin every evening.  Dispense: 5 pen; Refill: 3   He reports he is currently taking 5units Novolog before every meal but having some labile blood sugars. He is given SSI instructions as per patient instructions.   2. Hypertension Well controlled.  Continue current medications.     3. S/P bilateral BKA (below knee amputation) ([place]) Doing well with bilateral prosthetic lower legs.   4. End stage renal failure on dialysis ([place]) Stable.   5. Peripheral vascular disease ([place]) Continue atorvastatin and routine vascular follow up.    The entirety of the information documented in the History of Present Illness, Review of Systems and Physical Exam were personally obtained by me. Portions of this information were initially documented by Minette Headland, CMA and reviewed by me  for thoroughness and accuracy.      [name], MD  [facility]

## 2019-07-27 ENCOUNTER — Encounter: Payer: Self-pay | Admitting: Family Medicine

## 2019-08-29 ENCOUNTER — Telehealth: Payer: Self-pay | Admitting: Family Medicine

## 2019-08-29 NOTE — Telephone Encounter (Signed)
Please advise patient we received forms for disability, but we need more info. Need to do exact date he became unable to work and limitation that prevent him from returning to his previous job.

## 2019-08-29 NOTE — Telephone Encounter (Signed)
I called an spoke to patient and advised him as below. Patient states he needs to look this information up. He will call us back with the answers. If patient doesn't call back, we will need to reach back out to him about this message.

## 2019-09-04 NOTE — Telephone Encounter (Signed)
I called and spoke with patient and his wife. Patient's wife reports that she is not 100% sure of the exact date he became unable to work but she thinks it was around the date of 02/09/2016.  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

## 2019-09-06 NOTE — Telephone Encounter (Signed)
He needs to make appointment to go over this form. It is very extensive and I need much more information in order to complete it. In the meantime they need to find out what the exact date was that he last worked. If he worked part time need to know the dates of that, and if he had a change in his job duties at any time due to disability, need the exact dates of that. Form does not allow guesswork.

## 2019-09-07 NOTE — Telephone Encounter (Signed)
I called patient and advised him as below. Patient verbally voiced understanding. I made an appointment for patient to come in the office Wednesday 09/12/2019 at 10:40am. Patient is unable to come to appointment on this day due to dialysis. Patient is available to come in on Tuesdays or Thursdays. Patient will have his wife call back to reschedule appointment.

## 2019-09-07 NOTE — Telephone Encounter (Signed)
Does he needs a 40 minute time slot? If so, can I offer them an appointment to come in on this Wednesday 09/12/2019 at 10:40am? You have an

## 2019-09-11 ENCOUNTER — Ambulatory Visit (INDEPENDENT_AMBULATORY_CARE_PROVIDER_SITE_OTHER): Payer: Medicare Other | Admitting: Family Medicine

## 2019-09-11 ENCOUNTER — Encounter: Payer: Self-pay | Admitting: Family Medicine

## 2019-09-11 ENCOUNTER — Other Ambulatory Visit: Payer: Self-pay

## 2019-09-11 VITALS — BP 156/77 | HR 57 | Temp 97.1°F | Wt 157.4 lb

## 2019-09-11 DIAGNOSIS — N186 End stage renal disease: Secondary | ICD-10-CM

## 2019-09-11 DIAGNOSIS — D631 Anemia in chronic kidney disease: Secondary | ICD-10-CM

## 2019-09-11 DIAGNOSIS — G40909 Epilepsy, unspecified, not intractable, without status epilepticus: Secondary | ICD-10-CM | POA: Diagnosis not present

## 2019-09-11 DIAGNOSIS — I739 Peripheral vascular disease, unspecified: Secondary | ICD-10-CM

## 2019-09-11 DIAGNOSIS — E1129 Type 2 diabetes mellitus with other diabetic kidney complication: Secondary | ICD-10-CM

## 2019-09-11 DIAGNOSIS — K5080 Crohn's disease of both small and large intestine without complications: Secondary | ICD-10-CM

## 2019-09-11 DIAGNOSIS — R5383 Other fatigue: Secondary | ICD-10-CM

## 2019-09-11 DIAGNOSIS — Z992 Dependence on renal dialysis: Secondary | ICD-10-CM

## 2019-09-11 DIAGNOSIS — Z8673 Personal history of transient ischemic attack (TIA), and cerebral infarction without residual deficits: Secondary | ICD-10-CM

## 2019-09-11 DIAGNOSIS — Z89512 Acquired absence of left leg below knee: Secondary | ICD-10-CM

## 2019-09-11 DIAGNOSIS — G629 Polyneuropathy, unspecified: Secondary | ICD-10-CM

## 2019-09-11 DIAGNOSIS — Z794 Long term (current) use of insulin: Secondary | ICD-10-CM

## 2019-09-11 DIAGNOSIS — G3184 Mild cognitive impairment, so stated: Secondary | ICD-10-CM

## 2019-09-11 DIAGNOSIS — Z89511 Acquired absence of right leg below knee: Secondary | ICD-10-CM

## 2019-09-11 NOTE — Progress Notes (Signed)
Patient: [name] Male    DOB: [birth year]   58 y.o.   MRN: [id number] Visit Date: 09/11/2019  Today's Provider: [name], MD   Chief Complaint  Patient presents with  . disability forms   Subjective:     HPI  Disability Evaluation: Patient coming in to review and complete renewal of disability forms. He reports he initially went on disability in 2017 when he went on dialysis. 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 He was admitted to [facility] on 02-03-2016 with pyogenic arthritis of left knee joint requiring synovectomy. Prior to hospitalization he had CKD stage 5, and progressed to ESRD while hospitalized, started on dialysis which he has required every since. He was initially unable to work due to recovery from knee surgery, extreme fatigue from ESRD and having to have hemodialysis three time a week. He was subsequently hospitalized for hemorrhagic stroke with seizure June 27th 2018, empyema on 05/19/2017, and has had to have multiple amputations of multiple extremities due to severe peripheral vascular disease. He continues to require frequent follow up with [facility] neurology maintained on Keppra for seizure prophylaxis and mild cognitive impairment making it impossible to perform his previous work duties. , and vascular, and continues to be on 3 x weekly hemodialysis. He also had Crohn's disease previousy followed at [facility], although has not required follow for several years followed at   Allergies  Allergen Reactions  . Methotrexate Other (See Comments)    Blood count drops  . Vancomycin Shortness Of Breath    Eyes watering, SOB, wheezing  . Cefepime Other (See Comments)  . Tape      Current Outpatient Medications:  .  Adalimumab ([name] PEN) 40 MG/0.4ML PNKT, Inject 40 mg into the muscle once a week., Disp: , Rfl:  .  Alcohol Swabs PADS, Use as directed to check blood sugar three times daily for insulin dependent type 2 diabetes., Disp: 100 each, Rfl: 12 .   amLODipine (NORVASC) 10 MG tablet, Take 1 tablet (10 mg total) by mouth daily as needed., Disp: 90 tablet, Rfl: 0 .  atorvastatin (LIPITOR) 80 MG tablet, Take 1 tablet (80 mg total) by mouth daily., Disp: 90 tablet, Rfl: 3 .  AURYXIA 1 GM 210 MG(Fe) tablet, Take 420 mg by mouth 3 (three) times daily., Disp: , Rfl:  .  Blood Glucose Monitoring Suppl (ONE TOUCH ULTRA 2) w/Device KIT, Use as directed to check blood sugar three times daily. E11.9, Disp: 1 each, Rfl: 0 .  Continuous Blood Gluc Receiver (FREESTYLE LIBRE READER) DEVI, 1 Units by Does not apply route daily. USE TO CHECK BLOOD SUGAR UP TO FOUR TIMES A DAY AS DIRECTED. DX INSULIN DEPENDENT DIABETES, Disp: 1 Device, Rfl: 1 .  Continuous Blood Gluc Sensor ([place]) MISC, Use to check blood sugar four times daily for insulin dependent diabetes., Disp: 1 each, Rfl: 12 .  furosemide (LASIX) 80 MG tablet, Take 1 tablet (80 mg total) by mouth 2 (two) times daily., Disp: 180 tablet, Rfl: 3 .  gabapentin (NEURONTIN) 100 MG capsule, Take 1 capsule (100 mg total) by mouth 2 (two) times daily., Disp: 180 capsule, Rfl: 2 .  glucose blood (ONE TOUCH ULTRA TEST) test strip, Use as directed to check blood sugar three times daily. E11.9, Disp: 100 each, Rfl: 12 .  insulin aspart (NOVOLOG FLEXPEN) 100 UNIT/ML FlexPen, Inject up to 15 units before meals as directed by physician, Disp: 15  mL, Rfl: 3 .  Insulin Glargine (LANTUS SOLOSTAR) 100 UNIT/ML Solostar Pen, Inject 10 Units into the skin every evening., Disp: 5 pen, Rfl: 3 .  Insulin Pen Needle 32G X 6 MM MISC, Use to inject insulin via pen up to three times daily for insulin dependent type 2 diabetes., Disp: 100 each, Rfl: 12 .  levETIRAcetam (KEPPRA) 250 MG tablet, TK 1 T PO BID, Disp: , Rfl:  .  lidocaine-prilocaine (EMLA) cream, Apply 1 application topically as needed., Disp: , Rfl:  .  lisinopril (ZESTRIL) 10 MG tablet, TAKE 1 TABLET BY MOUTH ONCE DAILY ON TUESDAY, THURSDAY, SATURDAY,  AND SUNDAY-NON DIALYSIS DAYS., Disp: 30 tablet, Rfl: 12 .  ONE TOUCH LANCETS MISC, Use as directed to check blood sugar three times daily. E11.9, Disp: 100 each, Rfl: 11 .  sevelamer carbonate (RENVELA) 800 MG tablet, Take 1 tablet (800 mg total) by mouth 3 (three) times daily., Disp: 270 tablet, Rfl: 3 .  triamcinolone ointment (KENALOG) 0.1 %, Apply 1 application topically as needed. , Disp: , Rfl: 0  Review of Systems  Constitutional: Positive for fatigue. Negative for appetite change, chills and fever.  Respiratory: Negative for chest tightness, shortness of breath and wheezing.   Cardiovascular: Negative for chest pain and palpitations.  Gastrointestinal: Negative for abdominal pain, nausea and vomiting.  Psychiatric/Behavioral: Positive for confusion. Negative for agitation. The patient is not nervous/anxious.     Social History   Tobacco Use  . Smoking status: Never Smoker  . Smokeless tobacco: Never Used  Substance Use Topics  . Alcohol use: No      Objective:   BP (!) 156/77 (BP Location: Right Arm, Patient Position: Sitting, Cuff Size: Normal)   Pulse (!) 57   Temp (!) 97.1 F (36.2 C) (Temporal)   Wt 157 lb 6.4 oz (71.4 kg)   SpO2 99%   BMI 21.95 kg/m  Vitals:   09/11/19 1444  BP: (!) 156/77  Pulse: (!) 57  Temp: (!) 97.1 F (36.2 C)  TempSrc: Temporal  SpO2: 99%  Weight: 157 lb 6.4 oz (71.4 kg)  Body mass index is 21.95 kg/m.   Physical Exam   General Appearance:    Well developed, well nourished male in no acute distress. Sitting in [place]  Eyes:    PERRL, conjunctiva/corneas clear, EOM's intact       Lungs:     Clear to auscultation bilaterally, respirations unlabored  Heart:    Bradycardic. Normal rhythm. No murmurs, rubs, or gallops.   MS:   Below knee amputation of left lower extremity is noted. Below knee amputation of right lower extremity is noted. Left finger amputation noted.   Neurologic:   Awake, alert, oriented x 3. No apparent focal  neurological           defect.          Assessment & Plan    1. Seizure disorder ([place]) Secondary to CVA 2017, well controlled on Keppra. Continue regular follow up [facility] neurology  2. Crohn's disease of both small and large intestine without complication ([place]) Quiet. Previously followed [facility]  3. Peripheral vascular disease ([place])   4. Type 2 diabetes mellitus with other diabetic kidney complication, with long-term current use of insulin ([facility]) Well controlled.  Continue current medications.    5. Neuropathy   6. Mild cognitive impairment   7. End stage renal failure on dialysis ([place])   8. S/P bilateral BKA (below knee amputation) ([place])   9. History of  CVA (cerebrovascular accident)   10. Anemia due to chronic kidney disease, on chronic dialysis ([place])   11. Other fatigue  Patient severely limited by multiple chronic medical conditions listed above making it impossible to work and is not expected to be able to in the future. Marland Kitchen Extensively reviewed his medical history and state of current conditions. Completed forms for renewal of disability.      [name], MD  [facility]

## 2019-09-12 ENCOUNTER — Ambulatory Visit: Payer: Self-pay | Admitting: Family Medicine

## 2019-09-30 DIAGNOSIS — G3184 Mild cognitive impairment, so stated: Secondary | ICD-10-CM | POA: Insufficient documentation

## 2019-10-23 ENCOUNTER — Telehealth: Payer: Self-pay

## 2019-10-23 ENCOUNTER — Ambulatory Visit: Payer: Self-pay | Admitting: Family Medicine

## 2019-10-23 NOTE — Progress Notes (Deleted)
Patient: [name] Male    DOB: December 01, 1960   59 y.o.   MRN: [id number] Visit Date: 10/23/2019  Today's Provider: [name], MD   No chief complaint on file.  Subjective:     HPI  Diabetes Mellitus Type II, Follow-up:   Recent Labs       Lab Results  Component Value Date   HGBA1C 7.8 (A) 07/24/2019   HGBA1C 6.3 (A) 07/25/2018   HGBA1C 7.0 01/31/2018      Last seen for diabetes 3 months ago.  Management since then includes counseled that Lantus is a basal insulin and should take the same dose every day. He reports excellent compliance with treatment. He is not having side effects.  Current symptoms include none  Home blood sugar records:   Episodes of hypoglycemia? no              Current Insulin regimen: Lantus  7-10 Units daily and Novolog 5 units before meals  Most Recent Eye Exam:10/20/2017 Weight trend: stable Prior visit with dietician: No Current exercise: none Current diet habits: in general, an "unhealthy" diet, on average, 2 meals per day  Pertinent Labs: Lab Results  Component Value Date   CHOL 117 12/01/2017   HDL 48 12/01/2017   LDLCALC 53 12/01/2017   TRIG 79 12/01/2017   CHOLHDL 2.4 12/01/2017   Wt Readings from Last 3 Encounters:  09/11/19 157 lb 6.4 oz (71.4 kg)  07/24/19 156 lb 6.4 oz (70.9 kg)  01/08/19 191 lb 12.8 oz (87 kg)    ------------------------------------------------------------------------  Hypertension, follow-up:   BP Readings from Last 3 Encounters:  09/11/19 (!) 156/77  07/24/19 121/68  01/08/19 (!) 153/81    He was last seen for hypertension 3 months ago.   Management changes since that visit include continue the same medications. He reports excellent compliance with treatment. He is not having side effects.  He is not exercising. He is not adherent to low salt diet.   Outside blood pressures  He is experiencing none.  Patient denies chest pain, chest pressure/discomfort,  claudication, dyspnea, exertional chest pressure/discomfort, fatigue, irregular heart beat, lower extremity edema, near-syncope, orthopnea, palpitations, paroxysmal nocturnal dyspnea, syncope and tachypnea.   Cardiovascular risk factors include advanced age (older than 7 for men, 53 for women), diabetes mellitus, hypertension and male gender.  Use of agents associated with hypertension: none.                Weight trend: stable Wt Readings from Last 3 Encounters:  09/11/19 157 lb 6.4 oz (71.4 kg)  07/24/19 156 lb 6.4 oz (70.9 kg)  01/08/19 191 lb 12.8 oz (87 kg)   Current diet: in general, an "unhealthy" diet  ------------------------------------------------------------------------  Allergies  Allergen Reactions  . Methotrexate Other (See Comments)    Blood count drops  . Vancomycin Shortness Of Breath    Eyes watering, SOB, wheezing  . Cefepime Other (See Comments)  . Tape      Current Outpatient Medications:  .  Adalimumab ([name] PEN) 40 MG/0.4ML PNKT, Inject 40 mg into the muscle once a week., Disp: , Rfl:  .  Alcohol Swabs PADS, Use as directed to check blood sugar three times daily for insulin dependent type 2 diabetes., Disp: 100 each, Rfl: 12 .  amLODipine (NORVASC) 10 MG tablet, Take 1 tablet (10 mg total) by mouth daily as needed., Disp: 90 tablet, Rfl: 0 .  atorvastatin (LIPITOR) 80 MG tablet, Take 1 tablet (80  mg total) by mouth daily., Disp: 90 tablet, Rfl: 3 .  AURYXIA 1 GM 210 MG(Fe) tablet, Take 420 mg by mouth 3 (three) times daily., Disp: , Rfl:  .  Blood Glucose Monitoring Suppl (ONE TOUCH ULTRA 2) w/Device KIT, Use as directed to check blood sugar three times daily. E11.9, Disp: 1 each, Rfl: 0 .  Continuous Blood Gluc Receiver (FREESTYLE LIBRE READER) DEVI, 1 Units by Does not apply route daily. USE TO CHECK BLOOD SUGAR UP TO FOUR TIMES A DAY AS DIRECTED. DX INSULIN DEPENDENT DIABETES, Disp: 1 Device, Rfl: 1 .  Continuous Blood Gluc Sensor ([place]) MISC, Use to check blood sugar four times daily for insulin dependent diabetes., Disp: 1 each, Rfl: 12 .  furosemide (LASIX) 80 MG tablet, Take 1 tablet (80 mg total) by mouth 2 (two) times daily., Disp: 180 tablet, Rfl: 3 .  gabapentin (NEURONTIN) 100 MG capsule, Take 1 capsule (100 mg total) by mouth 2 (two) times daily., Disp: 180 capsule, Rfl: 2 .  glucose blood (ONE TOUCH ULTRA TEST) test strip, Use as directed to check blood sugar three times daily. E11.9, Disp: 100 each, Rfl: 12 .  insulin aspart (NOVOLOG FLEXPEN) 100 UNIT/ML FlexPen, Inject up to 15 units before meals as directed by physician, Disp: 15 mL, Rfl: 3 .  Insulin Glargine (LANTUS SOLOSTAR) 100 UNIT/ML Solostar Pen, Inject 10 Units into the skin every evening., Disp: 5 pen, Rfl: 3 .  Insulin Pen Needle 32G X 6 MM MISC, Use to inject insulin via pen up to three times daily for insulin dependent type 2 diabetes., Disp: 100 each, Rfl: 12 .  levETIRAcetam (KEPPRA) 250 MG tablet, TK 1 T PO BID, Disp: , Rfl:  .  lidocaine-prilocaine (EMLA) cream, Apply 1 application topically as needed., Disp: , Rfl:  .  lisinopril (ZESTRIL) 10 MG tablet, TAKE 1 TABLET BY MOUTH ONCE DAILY ON TUESDAY, THURSDAY, SATURDAY, AND SUNDAY-NON DIALYSIS DAYS., Disp: 30 tablet, Rfl: 12 .  ONE TOUCH LANCETS MISC, Use as directed to check blood sugar three times daily. E11.9, Disp: 100 each, Rfl: 11 .  sevelamer carbonate (RENVELA) 800 MG tablet, Take 1 tablet (800 mg total) by mouth 3 (three) times daily., Disp: 270 tablet, Rfl: 3 .  triamcinolone ointment (KENALOG) 0.1 %, Apply 1 application topically as needed. , Disp: , Rfl: 0  Review of Systems  Constitutional: Negative.   Respiratory: Negative.   Cardiovascular: Negative.   Musculoskeletal: Negative.     Social History   Tobacco Use  . Smoking status: Never Smoker  . Smokeless tobacco: Never Used  Substance Use Topics  . Alcohol use: No      Objective:   There were no vitals taken  for this visit. There were no vitals filed for this visit.There is no height or weight on file to calculate BMI.   Physical Exam   No results found for any visits on 10/23/19.     Assessment & Plan        [name], MD  [facility]

## 2019-10-23 NOTE — Telephone Encounter (Signed)
Copied from [place] [id number]. Topic: Quick Sport and exercise psychologist Patient (Clinic Use ONLY) >> Oct 23, 2019  9:03 AM Randal Buba, CMA wrote: Reason for CRM: Called patient to complete COVID prescreen for appointment today at 1:40pm. Ok for [facility] to complete Prescreen. >> Oct 23, 2019  9:43 AM Scherrie Gerlach wrote: Pt states he was just in 11/24 and this is too soon. Pt prefers to wait until Feb to come in.  He is doing fine.

## 2019-10-23 NOTE — Telephone Encounter (Signed)
Copied from [place] [id number]. Topic: Quick Sport and exercise psychologist Patient (Clinic Use ONLY) >> Oct 23, 2019  9:03 AM Randal Buba, CMA wrote: Reason for CRM: Called patient to complete COVID prescreen for appointment today at 1:40pm. Ok for [facility] to complete Prescreen. >> Oct 23, 2019  9:43 AM Scherrie Gerlach wrote: Pt states he was just in 11/24 and this is too soon. Pt prefers to wait until Feb to come in.  He is doing fine.

## 2019-10-28 ENCOUNTER — Other Ambulatory Visit: Payer: Self-pay | Admitting: Physician Assistant

## 2019-12-11 ENCOUNTER — Ambulatory Visit (INDEPENDENT_AMBULATORY_CARE_PROVIDER_SITE_OTHER): Payer: Medicare Other | Admitting: Family Medicine

## 2019-12-11 ENCOUNTER — Other Ambulatory Visit: Payer: Self-pay

## 2019-12-11 ENCOUNTER — Encounter: Payer: Self-pay | Admitting: Family Medicine

## 2019-12-11 VITALS — BP 161/75 | HR 63 | Temp 97.3°F | Wt 151.8 lb

## 2019-12-11 DIAGNOSIS — E1129 Type 2 diabetes mellitus with other diabetic kidney complication: Secondary | ICD-10-CM | POA: Diagnosis not present

## 2019-12-11 DIAGNOSIS — G40909 Epilepsy, unspecified, not intractable, without status epilepticus: Secondary | ICD-10-CM

## 2019-12-11 DIAGNOSIS — I739 Peripheral vascular disease, unspecified: Secondary | ICD-10-CM | POA: Diagnosis not present

## 2019-12-11 DIAGNOSIS — Z89512 Acquired absence of left leg below knee: Secondary | ICD-10-CM

## 2019-12-11 DIAGNOSIS — Z89511 Acquired absence of right leg below knee: Secondary | ICD-10-CM

## 2019-12-11 DIAGNOSIS — N186 End stage renal disease: Secondary | ICD-10-CM | POA: Diagnosis not present

## 2019-12-11 DIAGNOSIS — Z992 Dependence on renal dialysis: Secondary | ICD-10-CM

## 2019-12-11 DIAGNOSIS — Z794 Long term (current) use of insulin: Secondary | ICD-10-CM | POA: Diagnosis not present

## 2019-12-11 LAB — POCT GLYCOSYLATED HEMOGLOBIN (HGB A1C)
Est. average glucose Bld gHb Est-mCnc: 146
Hemoglobin A1C: 6.7 % — AB (ref 4.0–5.6)

## 2019-12-11 MED ORDER — SEVELAMER CARBONATE 800 MG PO TABS: 800.0000 mg | ORAL_TABLET | Freq: Three times a day (TID) | ORAL | 3 refills | Status: DC

## 2019-12-11 MED ORDER — LISINOPRIL 10 MG PO TABS: ORAL_TABLET | ORAL | 4 refills | Status: DC

## 2019-12-11 NOTE — Patient Instructions (Signed)
.   Please review the attached list of medications and notify my office if there are any errors.   . Please bring all of your medications to every appointment so we can make sure that our medication list is the same as yours.   . Please contact your eyecare professional to schedule a routine eye exam

## 2019-12-11 NOTE — Progress Notes (Signed)
Patient: [name] Male    DOB: 14-Jun-1961   59 y.o.   MRN: [id number] Visit Date: 12/11/2019  Today's Provider: [name], MD   Chief Complaint  Patient presents with  . Diabetes  . Hypertension   Subjective:     HPI  Diabetes Mellitus Type II, Follow-up:   Recent Labs       Lab Results  Component Value Date   HGBA1C 7.8 (A) 07/24/2019   HGBA1C 6.3 (A) 07/25/2018   HGBA1C 7.0 01/31/2018      Last seen for diabetes 4 months ago.  Management since then includes counseled that Lantus is a basal insulin and should take the same dose every day.  He reports excellent compliance with treatment. He is not having side effects.  Current symptoms include none  Home blood sugar records: fasting range: 120-140 and evening ranges between 300-400.  Episodes of hypoglycemia? yes a couple times              Current Insulin regimen: Lantus  5-10 Units daily and Novolog 5 units before each meals  Most Recent Eye Exam:10/20/2017 Weight trend: stable Prior visit with dietician: No Current exercise: none Current diet habits: in general, an "unhealthy" diet, on average, 2 meals per day  Pertinent Labs: Labs (Brief)          Component Value Date/Time   CHOL 117 12/01/2017 1158   TRIG 79 12/01/2017 1158   HDL 48 12/01/2017 1158   LDLCALC 53 12/01/2017 1158   CREATININE 2.61 (H) 01/05/2019 1628   CREATININE 4.95 (H) 07/24/2014 1524         Wt Readings from Last 3 Encounters:  01/08/19 191 lb 12.8 oz (87 kg)  06/05/18 200 lb (90.7 kg)  12/10/17 190 lb 0.6 oz (86.2 kg)    ------------------------------------------------------------------------  Hypertension, follow-up:     BP Readings from Last 3 Encounters:  01/08/19 (!) 153/81  07/25/18 124/64  06/05/18 121/74    He was last seen for hypertension 4 months ago.   Management changes since that visit include continue the same medications. He reports excellent compliance with  treatment. He is not having side effects.  He is not exercising. He is not adherent to low salt diet.   Outside blood pressures are being checked dialysis He is experiencing none.  Patient denies chest pain, chest pressure/discomfort, claudication, dyspnea, exertional chest pressure/discomfort, fatigue, irregular heart beat, lower extremity edema, near-syncope, orthopnea, palpitations, paroxysmal nocturnal dyspnea, syncope and tachypnea.   Cardiovascular risk factors include advanced age (older than 38 for men, 3 for women), diabetes mellitus, hypertension and male gender.  Use of agents associated with hypertension: none.                Weight trend: stable    Wt Readings from Last 3 Encounters:  01/08/19 191 lb 12.8 oz (87 kg)  06/05/18 200 lb (90.7 kg)  12/10/17 190 lb 0.6 oz (86.2 kg)    Current diet: in general, an "unhealthy" diet    Allergies  Allergen Reactions  . Methotrexate Other (See Comments)    Blood count drops  . Vancomycin Shortness Of Breath    Eyes watering, SOB, wheezing  . Cefepime Other (See Comments)  . Tape      Current Outpatient Medications:  .  Adalimumab ([name] PEN) 40 MG/0.4ML PNKT, Inject 40 mg into the muscle once a week., Disp: , Rfl:  .  Alcohol Swabs PADS, Use as directed to  check blood sugar three times daily for insulin dependent type 2 diabetes., Disp: 100 each, Rfl: 12 .  amLODipine (NORVASC) 10 MG tablet, TAKE 1 TABLET(10 MG) BY MOUTH DAILY AS NEEDED, Disp: 90 tablet, Rfl: 0 .  atorvastatin (LIPITOR) 80 MG tablet, Take 1 tablet (80 mg total) by mouth daily., Disp: 90 tablet, Rfl: 3 .  AURYXIA 1 GM 210 MG(Fe) tablet, Take 420 mg by mouth 3 (three) times daily., Disp: , Rfl:  .  Blood Glucose Monitoring Suppl (ONE TOUCH ULTRA 2) w/Device KIT, Use as directed to check blood sugar three times daily. E11.9, Disp: 1 each, Rfl: 0 .  Continuous Blood Gluc Receiver (FREESTYLE LIBRE READER) DEVI, 1 Units by Does not apply route daily. USE TO  CHECK BLOOD SUGAR UP TO FOUR TIMES A DAY AS DIRECTED. DX INSULIN DEPENDENT DIABETES, Disp: 1 Device, Rfl: 1 .  Continuous Blood Gluc Sensor ([place]) MISC, Use to check blood sugar four times daily for insulin dependent diabetes., Disp: 1 each, Rfl: 12 .  furosemide (LASIX) 80 MG tablet, Take 1 tablet (80 mg total) by mouth 2 (two) times daily., Disp: 180 tablet, Rfl: 3 .  gabapentin (NEURONTIN) 100 MG capsule, Take 1 capsule (100 mg total) by mouth 2 (two) times daily., Disp: 180 capsule, Rfl: 2 .  glucose blood (ONE TOUCH ULTRA TEST) test strip, Use as directed to check blood sugar three times daily. E11.9, Disp: 100 each, Rfl: 12 .  insulin aspart (NOVOLOG FLEXPEN) 100 UNIT/ML FlexPen, Inject up to 15 units before meals as directed by physician, Disp: 15 mL, Rfl: 3 .  Insulin Glargine (LANTUS SOLOSTAR) 100 UNIT/ML Solostar Pen, Inject 10 Units into the skin every evening., Disp: 5 pen, Rfl: 3 .  Insulin Pen Needle 32G X 6 MM MISC, Use to inject insulin via pen up to three times daily for insulin dependent type 2 diabetes., Disp: 100 each, Rfl: 12 .  levETIRAcetam (KEPPRA) 250 MG tablet, TK 1 T PO BID, Disp: , Rfl:  .  lidocaine-prilocaine (EMLA) cream, Apply 1 application topically as needed., Disp: , Rfl:  .  lisinopril (ZESTRIL) 10 MG tablet, TAKE 1 TABLET BY MOUTH ONCE DAILY ON TUESDAY, THURSDAY, SATURDAY, AND SUNDAY-NON DIALYSIS DAYS., Disp: 30 tablet, Rfl: 12 .  ONE TOUCH LANCETS MISC, Use as directed to check blood sugar three times daily. E11.9, Disp: 100 each, Rfl: 11 .  sevelamer carbonate (RENVELA) 800 MG tablet, Take 1 tablet (800 mg total) by mouth 3 (three) times daily., Disp: 270 tablet, Rfl: 3 .  triamcinolone ointment (KENALOG) 0.1 %, Apply 1 application topically as needed. , Disp: , Rfl: 0  Review of Systems  Social History   Tobacco Use  . Smoking status: Never Smoker  . Smokeless tobacco: Never Used  Substance Use Topics  . Alcohol use: No        Objective:   BP (!) 161/75 (BP Location: Right Arm, Patient Position: Sitting, Cuff Size: Normal)   Pulse 63   Temp (!) 97.3 F (36.3 C) (Temporal)   Wt 151 lb 12.8 oz (68.9 kg)   BMI 21.17 kg/m  Vitals:   12/11/19 1114  BP: (!) 161/75  Pulse: 63  Temp: (!) 97.3 F (36.3 C)  TempSrc: Temporal  Weight: 151 lb 12.8 oz (68.9 kg)  Body mass index is 21.17 kg/m.   Physical Exam   General: Appearance:    Well developed, well nourished male in no acute distress  Eyes:    PERRL,  conjunctiva/corneas clear, EOM's intact       Lungs:     Clear to auscultation bilaterally, respirations unlabored  Heart:    Normal heart rate. Normal rhythm. No murmurs, rubs, or gallops.   MS:   Below knee amputation of left lower extremity is noted. Below knee amputation of right lower extremity is noted. Left index finger amputation noted.   Neurologic:   Awake, alert, oriented x 3. No apparent focal neurological           defect.        Results for orders placed or performed in visit on 12/11/19  POCT HgB A1C  Result Value Ref Range   Hemoglobin A1C 6.7 (A) 4.0 - 5.6 %   Est. average glucose Bld gHb Est-mCnc 146        Assessment & Plan    1. Type 2 diabetes mellitus with other diabetic kidney complication, with long-term current use of insulin (HCC) Well controlled.  Continue current medications.    2. End stage renal failure on dialysis ([name]) refill- sevelamer carbonate (RENVELA) 800 MG tablet; Take 1 tablet (800 mg total) by mouth 3 (three) times daily.  Dispense: 270 tablet; Refill: 3  3. S/P bilateral BKA (below knee amputation) ([name]) Doing fairly well with current prosthetic lower legs.   4. Peripheral vascular disease (HCC) Asymptomatic. Compliant with medication.  Continue aggressive risk factor modification.    5. Seizure disorder ([name]) Doing well with Keppra with no seizures on current medication regiment.   Future Appointments  Date Time Provider Ray  04/22/2020  11:00 AM Caryn Section, [name], MD [facility]      The entirety of the information documented in the History of Present Illness, Review of Systems and Physical Exam were personally obtained by me. Portions of this information were initially documented by Idelle Jo, CMA and reviewed by me for thoroughness and accuracy.   [name] Huh, MD  [facility]

## 2020-01-25 ENCOUNTER — Other Ambulatory Visit: Payer: Self-pay | Admitting: Physician Assistant

## 2020-02-27 ENCOUNTER — Telehealth: Payer: Self-pay | Admitting: Family Medicine

## 2020-02-27 NOTE — Telephone Encounter (Signed)
Left message for patient to schedule Annual Wellness Visit(AWVI) .Please schedule with Nurse Health Advisor [facility], [place].

## 2020-02-29 ENCOUNTER — Telehealth: Payer: Self-pay | Admitting: Family Medicine

## 2020-02-29 NOTE — Telephone Encounter (Signed)
Left message for patient to schedule Annual Wellness Visit. Marland KitchenPlease schedule with Nurse Health Advisor [facility], [place].

## 2020-03-20 ENCOUNTER — Other Ambulatory Visit: Payer: Self-pay

## 2020-03-20 ENCOUNTER — Emergency Department
Admission: EM | Admit: 2020-03-20 | Discharge: 2020-03-20 | Disposition: A | Discharge status: Home / Self Care | Payer: Medicare Other | Attending: Emergency Medicine | Admitting: Emergency Medicine

## 2020-03-20 DIAGNOSIS — Z794 Long term (current) use of insulin: Secondary | ICD-10-CM | POA: Insufficient documentation

## 2020-03-20 DIAGNOSIS — N186 End stage renal disease: Secondary | ICD-10-CM | POA: Insufficient documentation

## 2020-03-20 DIAGNOSIS — Z992 Dependence on renal dialysis: Secondary | ICD-10-CM | POA: Insufficient documentation

## 2020-03-20 DIAGNOSIS — T82838A Hemorrhage of vascular prosthetic devices, implants and grafts, initial encounter: Secondary | ICD-10-CM | POA: Insufficient documentation

## 2020-03-20 DIAGNOSIS — E1122 Type 2 diabetes mellitus with diabetic chronic kidney disease: Secondary | ICD-10-CM | POA: Diagnosis not present

## 2020-03-20 DIAGNOSIS — F1210 Cannabis abuse, uncomplicated: Secondary | ICD-10-CM | POA: Diagnosis not present

## 2020-03-20 LAB — CBC
HCT: 27.2 % — ABNORMAL LOW (ref 39.0–52.0)
Hemoglobin: 8.6 g/dL — ABNORMAL LOW (ref 13.0–17.0)
MCH: 27.6 pg (ref 26.0–34.0)
MCHC: 31.6 g/dL (ref 30.0–36.0)
MCV: 87.2 fL (ref 80.0–100.0)
Platelets: 93 10*3/uL — ABNORMAL LOW (ref 150–400)
RBC: 3.12 MIL/uL — ABNORMAL LOW (ref 4.22–5.81)
RDW: 21.3 % — ABNORMAL HIGH (ref 11.5–15.5)
WBC: 6.6 10*3/uL (ref 4.0–10.5)
nRBC: 0 % (ref 0.0–0.2)

## 2020-03-20 LAB — BASIC METABOLIC PANEL
Anion gap: 10 (ref 5–15)
BUN: 27 mg/dL — ABNORMAL HIGH (ref 6–20)
CO2: 31 mmol/L (ref 22–32)
Calcium: 8.9 mg/dL (ref 8.9–10.3)
Chloride: 100 mmol/L (ref 98–111)
Creatinine, Ser: 5.96 mg/dL — ABNORMAL HIGH (ref 0.61–1.24)
GFR calc Af Amer: 11 mL/min — ABNORMAL LOW (ref >60)
GFR calc non Af Amer: 10 mL/min — ABNORMAL LOW (ref >60)
Glucose, Bld: 154 mg/dL — ABNORMAL HIGH (ref 70–99)
Potassium: 3.6 mmol/L (ref 3.5–5.1)
Sodium: 141 mmol/L (ref 135–145)

## 2020-03-20 NOTE — Discharge Instructions (Addendum)
As we discussed please talk to your kidney doctor or primary care provider about your low platelet count today. They will want to recheck it and keep an eye on it. Please seek medical attention for any high fevers, chest pain, shortness of breath, change in behavior, persistent vomiting, bloody stool or any other new or concerning symptoms.

## 2020-03-20 NOTE — ED Provider Notes (Signed)
[facility] Emergency Department Provider Note  ____________________________________________   I have reviewed the triage vital signs and the nursing notes.   HISTORY  Chief Complaint access bleeding   History limited by: Not Limited   HPI [name] is a 59 y.o. male who presents to the emergency department today because of concern from bleeding from dialysis access site. Patient states that he noticed some continued bleeding from his left upper arm dialysis site after he finished his session yesterday. He gets dialysis MWF. He had tried dressing the bleeding at home but has had continued bleeding. The patient says that he has had bleeding in the past from his access site.   Records reviewed. Per medical record review patient has a history of ESRD on dialysis.   Past Medical History:  Diagnosis Date  . Crohn disease ([place])   . DVT of lower extremity (deep venous thrombosis) ([place]) 2016  . Empyema ([place]) 05/20/2017  . Encephalopathy 12/04/2017  . Hidradenitis suppurativa   . ICH (intracerebral hemorrhage) ([place])   . Peritonitis ([place]) 04/21/2017  . Pyogenic arthritis of knee ([place]) 02/04/2016  . Renal disorder   . Sepsis ([place]) 01/12/2018    Patient Active Problem List   Diagnosis Date Noted  . Mild cognitive impairment 09/30/2019  . Anemia 01/05/2019  . Peripheral vascular disease ([place]) 06/28/2018  . Pressure injury of skin 12/04/2017  . S/P bilateral BKA (below knee amputation) ([place]) 10/20/2017  . Atherosclerosis of native arteries of extremity with rest pain ([place]) 08/05/2017  . History of CVA (cerebrovascular accident) 04/15/2017  . Seizure disorder ([place]) 04/15/2017  . End stage renal failure on dialysis ([place]) 04/12/2016  . Aphthae 02/20/2016  . Hidradenitis 02/20/2016  . Leg pain 02/20/2016  . Neuropathy 02/20/2016  . Narrowing of intervertebral disc space 08/29/2015  . Vascular disorder of lower extremity 08/29/2015  . Failure of erection  08/29/2015  . Hypercholesteremia 08/29/2015  . Hypertension 08/29/2015  . Anemia due to chronic kidney disease 05/26/2015  . Venous insufficiency of leg 09/04/2014  . History of deep vein thrombosis (DVT) of lower extremity 08/16/2014  . Prostatic intraepithelial neoplasia 11/02/2013  . Elevated prostate specific antigen (PSA) 09/11/2013  . Benign prostatic hyperplasia with urinary obstruction 08/13/2013  . Spermatocele 08/13/2013  . Avitaminosis D 01/25/2013  . Type 2 diabetes mellitus with kidney complication, with long-term current use of insulin ([place]) 03/28/2012  . Crohn's disease ([place]) 08/03/2011    Past Surgical History:  Procedure Laterality Date  . ABDOMINAL SURGERY    . AMPUTATION FINGER Left 06/2019   [facility]  . ANGIOPLASTY Left    left fem-pop at [facility] 04-11-2018  . BELOW KNEE LEG AMPUTATION Right 08/2017   [facility]  . DIALYSIS/PERMA CATHETER INSERTION N/A 12/09/2017   Procedure: DIALYSIS/PERMA CATHETER INSERTION;  Surgeon: Katha Cabal, MD;  Location: [place] [facility];  Service: Cardiovascular;  Laterality: N/A;  . DIALYSIS/PERMA CATHETER INSERTION N/A 12/12/2017   Procedure: DIALYSIS/PERMA CATHETER INSERTION;  Surgeon: Algernon Huxley, MD;  Location: [place] [facility];  Service: Cardiovascular;  Laterality: N/A;  . DIALYSIS/PERMA CATHETER REMOVAL Left 12/09/2017   Procedure: DIALYSIS/PERMA CATHETER REMOVAL;  Surgeon: Katha Cabal, MD;  Location: [place] [facility];  Service: Cardiovascular;  Laterality: Left;  . KNEE SURGERY Left 02/04/2016   [facility]  . LEG AMPUTATION THROUGH LOWER TIBIA AND FIBULA Left 06/22/2018   [facility]  . LOWER EXTREMITY ANGIOGRAPHY Right 08/08/2017   Procedure: Lower Extremity Angiography;  Surgeon: Lucky Cowboy,  Erskine Squibb, MD;  Location: [place] [facility];  Service: Cardiovascular;  Laterality: Right;  . LOWER EXTREMITY ANGIOGRAPHY Right 08/22/2017   Procedure: Lower Extremity Angiography;  Surgeon: Algernon Huxley, MD;   Location: [place] [facility];  Service: Cardiovascular;  Laterality: Right;  . LOWER EXTREMITY INTERVENTION  08/08/2017   Procedure: LOWER EXTREMITY INTERVENTION;  Surgeon: Algernon Huxley, MD;  Location: [place] [facility];  Service: Cardiovascular;;  . LOWER EXTREMITY INTERVENTION  08/22/2017   Procedure: LOWER EXTREMITY INTERVENTION;  Surgeon: Algernon Huxley, MD;  Location: [place] [facility];  Service: Cardiovascular;;    Prior to Admission medications   Medication Sig Start Date End Date Taking? Authorizing Provider  Adalimumab ([name] PEN) 40 MG/0.4ML PNKT Inject 40 mg into the muscle once a week. 11/27/18   [provider]  Alcohol Swabs PADS Use as directed to check blood sugar three times daily for insulin dependent type 2 diabetes. 10/20/17   [name], MD  amLODipine (NORVASC) 10 MG tablet TAKE 1 TABLET(10 MG) BY MOUTH DAILY AS NEEDED 01/25/20   [name], MD  atorvastatin (LIPITOR) 80 MG tablet Take 1 tablet (80 mg total) by mouth daily. 07/06/19   [name], MD  AURYXIA 1 GM 210 MG(Fe) tablet Take 420 mg by mouth 3 (three) times daily. 10/23/18   [provider]  Blood Glucose Monitoring Suppl (ONE TOUCH ULTRA 2) w/Device KIT Use as directed to check blood sugar three times daily. E11.9 02/20/18   [name], MD  Continuous Blood Gluc Receiver (FREESTYLE LIBRE READER) DEVI 1 Units by Does not apply route daily. USE TO CHECK BLOOD SUGAR UP TO FOUR TIMES A DAY AS DIRECTED. DX INSULIN DEPENDENT DIABETES 05/01/18   [name], MD  Continuous Blood Gluc Sensor ([place]) MISC Use to check blood sugar four times daily for insulin dependent diabetes. 04/26/18   [name], MD  furosemide (LASIX) 80 MG tablet Take 1 tablet (80 mg total) by mouth 2 (two) times daily. 04/28/19   [name], MD  gabapentin (NEURONTIN) 100 MG capsule Take 1 capsule (100 mg total) by mouth 2 (two) times daily. 01/29/19   [name], MD   glucose blood (ONE TOUCH ULTRA TEST) test strip Use as directed to check blood sugar three times daily. E11.9 02/20/18   [name], MD  insulin aspart (NOVOLOG FLEXPEN) 100 UNIT/ML FlexPen Inject up to 15 units before meals as directed by physician 02/22/19   [name], MD  Insulin Glargine (LANTUS SOLOSTAR) 100 UNIT/ML Solostar Pen Inject 10 Units into the skin every evening. 07/24/19   [name], MD  Insulin Pen Needle 32G X 6 MM MISC Use to inject insulin via pen up to three times daily for insulin dependent type 2 diabetes. 10/20/17   [name], MD  levETIRAcetam (KEPPRA) 250 MG tablet TK 1 T PO BID 07/06/19   [provider]  lidocaine-prilocaine (EMLA) cream Apply 1 application topically as needed. 11/13/18   [provider]  lisinopril (ZESTRIL) 10 MG tablet TAKE 1 TABLET BY MOUTH ONCE DAILY ON TUESDAY, THURSDAY, SATURDAY, AND SUNDAY-NON DIALYSIS DAYS. 12/11/19   [name], MD  ONE [facility] LANCETS MISC Use as directed to check blood sugar three times daily. E11.9 02/20/18   [name], MD  sevelamer carbonate (RENVELA) 800 MG tablet Take 1 tablet (800 mg total) by mouth 3 (three) times daily. 12/11/19  [name], MD  triamcinolone ointment (KENALOG) 0.1 % Apply 1 application topically as needed.  05/02/17   [provider]    Allergies Methotrexate, Vancomycin, Cefepime, and Tape  Family History  Problem Relation Age of Onset  . Irritable bowel syndrome Sister   . Diabetes Sister   . Heart disease Mother   . Diabetes Mother   . Heart disease Father   . Rheumatic fever Father        as child  . Psoriasis Brother   . Arthritis Brother   . Diabetes Sister   . Diabetes Sister     Social History Social History   Tobacco Use  . Smoking status: Never Smoker  . Smokeless tobacco: Never Used  Substance Use Topics  . Alcohol use: No  . Drug use: Yes    Types: Marijuana    Review of Systems Constitutional: No  fever/chills Eyes: No visual changes. ENT: No sore throat. Cardiovascular: Denies chest pain. Respiratory: Denies shortness of breath. Gastrointestinal: No abdominal pain.  No nausea, no vomiting.  No diarrhea.   Genitourinary: Negative for dysuria. Musculoskeletal: Negative for back pain. Skin: Positive for bleeding from dialysis access center.  Neurological: Negative for headaches, focal weakness or numbness.  ____________________________________________   PHYSICAL EXAM:  VITAL SIGNS: ED Triage Vitals  Enc Vitals Group     BP 03/20/20 1444 (!) 203/77     Pulse Rate 03/20/20 1444 71     Resp 03/20/20 1444 18     Temp 03/20/20 1444 98.5 F (36.9 C)     Temp src --      SpO2 03/20/20 1444 100 %     Weight 03/20/20 1443 141 lb 1.5 oz (64 kg)     Height 03/20/20 1443 5' 11"  (1.803 m)     Head Circumference --      Peak Flow --      Pain Score 03/20/20 1449 0   Constitutional: Alert and oriented.  Eyes: Conjunctivae are normal.  ENT      Head: Normocephalic and atraumatic.      Nose: No congestion/rhinnorhea.      Mouth/Throat: Mucous membranes are moist.      Neck: No stridor. Hematological/Lymphatic/Immunilogical: No cervical lymphadenopathy. Cardiovascular: Normal rate, regular rhythm.  No murmurs, rubs, or gallops.  Respiratory: Normal respiratory effort without tachypnea nor retractions. Breath sounds are clear and equal bilaterally. No wheezes/rales/rhonchi. Gastrointestinal: Soft and non tender. No rebound. No guarding.  Genitourinary: Deferred Musculoskeletal: Normal range of motion in all extremities. No lower extremity edema. Neurologic:  Normal speech and language. No gross focal neurologic deficits are appreciated.  Skin:  Slight amount of bleeding from dialysis access site in left upper arm.  Psychiatric: Mood and affect are normal. Speech and behavior are normal. Patient exhibits appropriate insight and  judgment.  ____________________________________________    LABS (pertinent positives/negatives)  BMP na 141, k 3.6, glu 154, cr 5.96 CBC wbc 6.6, hgb 8.6, plt 93  ____________________________________________   EKG  None  ____________________________________________    RADIOLOGY  None  ____________________________________________   PROCEDURES  Procedures  ____________________________________________   INITIAL IMPRESSION / ASSESSMENT AND PLAN / ED COURSE  Pertinent labs & imaging results that were available during my care of the patient were reviewed by me and considered in my medical decision making (see chart for details).   Patient presented to the emergency department today because of concern for bleeding from his dialysis access site. The patient did have a small amount  of bleeding here. Dermabond was used to stop further bleeding. Blood work shows an anemia which is consistent with baseline. Platelet count was low. Discussed this with the patient. The patient was instructed to follow up for recheck platelet count.   ___________________________________________   FINAL CLINICAL IMPRESSION(S) / ED DIAGNOSES  Final diagnoses:  Bleeding from dialysis shunt, initial encounter [facility])     Note: This dictation was prepared with Dragon dictation. Any transcriptional errors that result from this process are unintentional     Nance Pear, MD 03/20/20 [phone]

## 2020-03-20 NOTE — ED Triage Notes (Signed)
Pt comes via POV from home with c/o access bleeding. Pt states he had dialysis yesterday and bandage placed after treatment. Pt states this am he took off bandage and it was still bleeding.  Pt states this has happened in past and he had to get a stent placed but not this much bleeding.  Pt denies any other issues.  New bandages placed at this time with gauze wrap and bleeding controlled.

## 2020-03-27 ENCOUNTER — Ambulatory Visit: Payer: Medicare Other | Admitting: Adult Health

## 2020-04-14 NOTE — Progress Notes (Signed)
I,Roshena L Chambers,acting as a scribe for [name], MD.,have documented all relevant documentation on the behalf of [name], MD,as directed by  [name], MD while in the presence of [name], MD.   Established patient visit   Patient: [name]   DOB: March 28, 1961   59 y.o. Male  MRN: [id number] Visit Date: 04/15/2020  Today's healthcare provider: [name], MD   Chief Complaint  Patient presents with  . Diabetes  . Hypertension     Subjective    HPI Diabetes Mellitus Type II, Follow-up  Lab Results  Component Value Date   HGBA1C 5.3 04/15/2020   HGBA1C 6.7 (A) 12/11/2019   HGBA1C 7.8 (A) 07/24/2019   Wt Readings from Last 3 Encounters:  04/15/20 150 lb (68 kg)  03/20/20 141 lb 1.5 oz (64 kg)  12/11/19 151 lb 12.8 oz (68.9 kg)   Last seen for diabetes 4 months ago.  Management since then includes continue same medication. He reports good compliance with treatment. He is not having side effects.  Symptoms: No fatigue No foot ulcerations  No appetite changes No nausea  No paresthesia of the feet  No polydipsia  No polyuria No visual disturbances   No vomiting     Home blood sugar records: blood sugars are not checked  Episodes of hypoglycemia? No    Current insulin regiment: Lantus 8-10 units only when sugar is over 300.   Fast acting insulin Novolog pen  5 units  Most Recent Eye Exam: not UTD Current exercise: none Current diet habits: in general, an "unhealthy" diet  Pertinent Labs: Lab Results  Component Value Date   CHOL 117 12/01/2017   HDL 48 12/01/2017   LDLCALC 53 12/01/2017   TRIG 79 12/01/2017   CHOLHDL 2.4 12/01/2017   Lab Results  Component Value Date   NA 141 03/20/2020   K 3.6 03/20/2020   CREATININE 5.96 (H) 03/20/2020   GFRNONAA 10 (L) 03/20/2020   GFRAA 11 (L) 03/20/2020   GLUCOSE 154 (H) 03/20/2020      ---------------------------------------------------------------------------------------------------  Follow up for Seizure disorder:  The patient was last seen for this 4 months ago. Changes made at last visit include none; continue same medications.  He reports good compliance with treatment. He feels that condition is Unchanged. He is not having side effects.   -----------------------------------------------------------------------------------------  Hypertension, follow-up  BP Readings from Last 3 Encounters:  04/15/20 (!) 190/66  03/20/20 (!) 181/81  12/11/19 (!) 161/75   Wt Readings from Last 3 Encounters:  04/15/20 150 lb (68 kg)  03/20/20 141 lb 1.5 oz (64 kg)  12/11/19 151 lb 12.8 oz (68.9 kg)     He was last seen for hypertension 8 months ago.   Management since that visit includes continue same medication.  He reports good compliance with treatment. He is not having side effects.  He is following a Regular diet. He is not exercising. He does not smoke.  Use of agents associated with hypertension: none.   Outside blood pressures are checked during dialysis. Symptoms: No chest pain No chest pressure  No palpitations No syncope  No dyspnea No orthopnea  No paroxysmal nocturnal dyspnea No lower extremity edema   Pertinent labs: Lab Results  Component Value Date   CHOL 117 12/01/2017   HDL 48 12/01/2017   LDLCALC 53 12/01/2017   TRIG 79 12/01/2017   CHOLHDL 2.4 12/01/2017   Lab Results  Component Value Date   NA 141 03/20/2020  K 3.6 03/20/2020   CREATININE 5.96 (H) 03/20/2020   GFRNONAA 10 (L) 03/20/2020   GFRAA 11 (L) 03/20/2020   GLUCOSE 154 (H) 03/20/2020     The ASCVD Risk score Mikey Bussing DC Jr., et al., 2013) failed to calculate for the following reasons:   The patient has a prior MI or stroke diagnosis   ---------------------------------------------------------------------------------------------------  Follow up ER visit  Patient was  seen in ER on 03/20/2020 and 04/10/2020 at [facility]. He was treated for bleeding from dialysis shunt, problem with vascular access and anemia. He reports good compliance with treatment. He reports this condition is Improved.  -----------------------------------------------------------------------------------------      Medications: Outpatient Medications Prior to Visit  Medication Sig  . Adalimumab ([name] PEN) 40 MG/0.4ML PNKT Inject 40 mg into the muscle once a week.  . Alcohol Swabs PADS Use as directed to check blood sugar three times daily for insulin dependent type 2 diabetes.  Marland Kitchen amLODipine (NORVASC) 10 MG tablet TAKE 1 TABLET(10 MG) BY MOUTH DAILY AS NEEDED  . atorvastatin (LIPITOR) 80 MG tablet Take 1 tablet (80 mg total) by mouth daily.  Lorin Picket 1 GM 210 MG(Fe) tablet Take 420 mg by mouth 3 (three) times daily.  . Blood Glucose Monitoring Suppl (ONE TOUCH ULTRA 2) w/Device KIT Use as directed to check blood sugar three times daily. E11.9  . carvedilol (COREG) 25 MG tablet Take 25 mg by mouth 2 (two) times daily.  . furosemide (LASIX) 80 MG tablet Take 1 tablet (80 mg total) by mouth 2 (two) times daily.  Marland Kitchen gabapentin (NEURONTIN) 100 MG capsule Take 1 capsule (100 mg total) by mouth 2 (two) times daily.  . Insulin Glargine (LANTUS SOLOSTAR) 100 UNIT/ML Solostar Pen Inject 10 Units into the skin every evening.  . irbesartan (AVAPRO) 75 MG tablet Take 1 tablet by mouth daily.  Marland Kitchen levETIRAcetam (KEPPRA) 250 MG tablet TK 1 T PO BID  . lidocaine-prilocaine (EMLA) cream Apply 1 application topically as needed.  Marland Kitchen lisinopril (ZESTRIL) 10 MG tablet TAKE 1 TABLET BY MOUTH ONCE DAILY ON TUESDAY, THURSDAY, SATURDAY, AND SUNDAY-NON DIALYSIS DAYS.  Marland Kitchen sevelamer carbonate (RENVELA) 800 MG tablet Take 1 tablet (800 mg total) by mouth 3 (three) times daily.  Marland Kitchen sulfamethoxazole-trimethoprim (BACTRIM) 400-80 MG tablet TAKE 1 TABLET BY MOUTH TWICE DAILY AS NEEDED FOR 10 TO 14 DAYS FOR FLARES (Patient not  taking: Reported on 04/15/2020)  . [DISCONTINUED] triamcinolone ointment (KENALOG) 0.1 % Apply 1 application topically as needed.  (Patient not taking: Reported on 04/15/2020)   No facility-administered medications prior to visit.    Review of Systems  Constitutional: Negative for appetite change, chills and fever.  HENT: Positive for tinnitus.   Respiratory: Negative for chest tightness, shortness of breath and wheezing.   Cardiovascular: Negative for chest pain and palpitations.  Gastrointestinal: Negative for abdominal pain, nausea and vomiting.      Objective    BP (!) 190/66 (BP Location: Right Arm, Cuff Size: Normal)   Pulse 63   Temp (!) 97.5 F (36.4 C) (Temporal)   Resp 16   Wt 150 lb (68 kg)   SpO2 95% Comment: room air  BMI 20.92 kg/m    Physical Exam   General: Appearance:    Thin male in no acute distress  Eyes:    PERRL, conjunctiva/corneas clear, EOM's intact       Lungs:     Clear to auscultation bilaterally, respirations unlabored  Heart:    Normal heart rate. Normal rhythm.  No murmurs, rubs, or gallops.   MS:   Below knee amputation of left lower extremity is noted. Below knee amputation of right lower extremity is noted. Left finger amputation noted.   Neurologic:   Awake, alert, oriented x 3. No apparent focal neurological           defect.        Results for orders placed or performed in visit on 04/15/20  POCT HgB A1C  Result Value Ref Range   Hemoglobin A1C 5.3 4.0 - 5.6 %   Est. average glucose Bld gHb Est-mCnc 105     Assessment & Plan     1. Type 2 diabetes mellitus with other diabetic kidney complication, with long-term current use of insulin ([place]) Very well controlled. Continue current insulin regiment.   2. Seizure disorder ([place]) Well controlled. Continue current medications.    3. Essential hypertension Labile BP, but recently started on irbesartan and restarted on carvedilol by nephrology. Continue current medications.    4. Colon  cancer screening  - Cologuard  He is to follow up with vascular and nephrology regarding complication for dialysis port.         The entirety of the information documented in the History of Present Illness, Review of Systems and Physical Exam were personally obtained by me. Portions of this information were initially documented by the CMA and reviewed by me for thoroughness and accuracy.      [name], MD  [facility] [phone] (phone) [phone] (fax)  [place]

## 2020-04-15 ENCOUNTER — Other Ambulatory Visit: Payer: Self-pay

## 2020-04-15 ENCOUNTER — Encounter: Payer: Self-pay | Admitting: Family Medicine

## 2020-04-15 ENCOUNTER — Ambulatory Visit (INDEPENDENT_AMBULATORY_CARE_PROVIDER_SITE_OTHER): Payer: Medicare Other | Admitting: Family Medicine

## 2020-04-15 VITALS — BP 190/66 | HR 63 | Temp 97.5°F | Resp 16 | Wt 150.0 lb

## 2020-04-15 DIAGNOSIS — I10 Essential (primary) hypertension: Secondary | ICD-10-CM | POA: Diagnosis not present

## 2020-04-15 DIAGNOSIS — Z1211 Encounter for screening for malignant neoplasm of colon: Secondary | ICD-10-CM

## 2020-04-15 DIAGNOSIS — G40909 Epilepsy, unspecified, not intractable, without status epilepticus: Secondary | ICD-10-CM | POA: Diagnosis not present

## 2020-04-15 DIAGNOSIS — E1129 Type 2 diabetes mellitus with other diabetic kidney complication: Secondary | ICD-10-CM | POA: Diagnosis not present

## 2020-04-15 DIAGNOSIS — Z794 Long term (current) use of insulin: Secondary | ICD-10-CM | POA: Diagnosis not present

## 2020-04-15 LAB — POCT GLYCOSYLATED HEMOGLOBIN (HGB A1C)
Est. average glucose Bld gHb Est-mCnc: 105
Hemoglobin A1C: 5.3 % (ref 4.0–5.6)

## 2020-04-22 ENCOUNTER — Ambulatory Visit: Payer: Medicare Other | Admitting: Family Medicine

## 2020-04-23 ENCOUNTER — Other Ambulatory Visit: Payer: Self-pay | Admitting: Family Medicine

## 2020-04-23 NOTE — Telephone Encounter (Signed)
Requested Prescriptions  Pending Prescriptions Disp Refills  . amLODipine (NORVASC) 10 MG tablet [Pharmacy Med Name: AMLODIPINE BESYLATE 10MG TABLETS] 90 tablet 0    Sig: TAKE 1 TABLET(10 MG) BY MOUTH DAILY AS NEEDED     Cardiovascular:  Calcium Channel Blockers Failed - 04/23/2020  3:31 AM      Failed - Last BP in normal range    BP Readings from Last 1 Encounters:  04/15/20 (!) 190/66         Passed - Valid encounter within last 6 months    Recent Outpatient Visits          1 week ago Type 2 diabetes mellitus with other diabetic kidney complication, with long-term current use of insulin ([place])   [facility] [name], MD   4 months ago Type 2 diabetes mellitus with other diabetic kidney complication, with long-term current use of insulin ([place])   [facility] [name], MD   7 months ago Peripheral vascular disease [facility])   [facility] [name], MD   9 months ago Type 2 diabetes mellitus with other diabetic kidney complication, with long-term current use of insulin [facility])   [facility] [name], MD   1 year ago Type 2 diabetes mellitus with other diabetic kidney complication, with long-term current use of insulin [facility])   [facility] [name], MD      Future Appointments            In 3 months [name], [name], MD [facility], [facility]           Patient seen by provider on 04/15/2020.  Noted patient had recently had other antihypertensive agents started / adjusted for HTN.

## 2020-05-02 ENCOUNTER — Other Ambulatory Visit: Payer: Self-pay | Admitting: Family Medicine

## 2020-05-13 ENCOUNTER — Telehealth: Payer: Self-pay | Admitting: Family Medicine

## 2020-05-13 NOTE — Telephone Encounter (Signed)
Copied from [place] [phone]. Topic: Medicare AWV >> May 13, 2020  4:06 PM Cher Nakai R wrote: Reason for CRM:  Left message for patient to call back and schedule Medicare Annual Wellness Visit (AWV) either virtually or in office.  No hx of AWV  Please schedule at anytime with [facility] Health Advisor.

## 2020-05-26 NOTE — Progress Notes (Signed)
Established patient visit   Patient: [name]   DOB: 1961-04-15   59 y.o. Male  MRN: [id number] Visit Date: 05/27/2020  Today's healthcare provider: [name], MD   No chief complaint on file.  Subjective    HPI  Patient is here today to have Biometric screening form completed for insurance. He has multiple complicated medication problems, currently on dialysis 3 x weekly. He reports nephrologist recently started him irbesartan for his blood pressure and he is not having any adverse effects from medication. He is fatigued, but otherwise feels well. No dyspnea, no fevers, chills, sweats, nausea, vomiting. He does report poor appetite for quite a while.      Medications: Outpatient Medications Prior to Visit  Medication Sig  . Adalimumab ([name] PEN) 40 MG/0.4ML PNKT Inject 40 mg into the muscle once a week.  . Alcohol Swabs PADS Use as directed to check blood sugar three times daily for insulin dependent type 2 diabetes.  Marland Kitchen amLODipine (NORVASC) 10 MG tablet TAKE 1 TABLET(10 MG) BY MOUTH DAILY AS NEEDED  . atorvastatin (LIPITOR) 80 MG tablet Take 1 tablet (80 mg total) by mouth daily.  Lorin Picket 1 GM 210 MG(Fe) tablet Take 420 mg by mouth 3 (three) times daily.  . Blood Glucose Monitoring Suppl (ONE TOUCH ULTRA 2) w/Device KIT Use as directed to check blood sugar three times daily. E11.9  . carvedilol (COREG) 25 MG tablet Take 25 mg by mouth 2 (two) times daily.  . furosemide (LASIX) 80 MG tablet TAKE 1 TABLET(80 MG) BY MOUTH TWICE DAILY  . gabapentin (NEURONTIN) 100 MG capsule Take 1 capsule (100 mg total) by mouth 2 (two) times daily.  . Insulin Glargine (LANTUS SOLOSTAR) 100 UNIT/ML Solostar Pen Inject 10 Units into the skin every evening.  . irbesartan (AVAPRO) 75 MG tablet Take 1 tablet by mouth daily.  Marland Kitchen levETIRAcetam (KEPPRA) 250 MG tablet TK 1 T PO BID  . lidocaine-prilocaine (EMLA) cream Apply 1 application topically as needed.  . sevelamer carbonate (RENVELA)  800 MG tablet Take 1 tablet (800 mg total) by mouth 3 (three) times daily.  Marland Kitchen sulfamethoxazole-trimethoprim (BACTRIM) 400-80 MG tablet TAKE 1 TABLET BY MOUTH TWICE DAILY AS NEEDED FOR 10 TO 14 DAYS FOR FLARES  . [DISCONTINUED] lisinopril (ZESTRIL) 10 MG tablet TAKE 1 TABLET BY MOUTH ONCE DAILY ON TUESDAY, THURSDAY, SATURDAY, AND SUNDAY-NON DIALYSIS DAYS.   No facility-administered medications prior to visit.    Review of Systems  HENT: Positive for congestion.   Respiratory: Positive for cough (clear). Negative for shortness of breath.   Cardiovascular: Negative for chest pain and palpitations.  Gastrointestinal: Negative for abdominal pain.      Objective    BP (!) 166/84 (BP Location: Right Arm, Patient Position: Sitting, Cuff Size: Normal)   Pulse 65   Temp 98.3 F (36.8 C) (Oral)   Ht 5' 11"  (1.803 m)   Wt 147 lb (66.7 kg)   SpO2 99%   BMI 20.50 kg/m    Physical Exam   General: Appearance:    Well developed, well nourished male in no acute distress  Eyes:    PERRL, conjunctiva/corneas clear, EOM's intact       Lungs:     Clear to auscultation bilaterally, respirations unlabored  Heart:    Normal heart rate. Normal rhythm. No murmurs, rubs, or gallops.   MS:   Below knee amputation of left lower extremity is noted. Below knee amputation of right lower extremity is  noted.   Neurologic:   Awake, alert, oriented x 3. No apparent focal neurological           defect.          Assessment & Plan     1. Type 2 diabetes mellitus with other diabetic kidney complication, with long-term current use of insulin ([place]) Labs done for insurance Biometric screening - Comprehensive metabolic panel - Lipid panel - Hemoglobin A1c  2. Encounter for screening for COVID-19  - SARS-CoV-2 Semi-Quantitative Total Antibody, Spike  3. Crohn's disease of both small and large intestine without complication ([place]) Stable, he does report poor appetite.  On [name] by [facility] dermatology for cutaneous  Crohn's.   Future Appointments  Date Time Provider [place]  08/19/2020  1:40 PM Caryn Section [name], MD [facility]         The entirety of the information documented in the History of Present Illness, Review of Systems and Physical Exam were personally obtained by me. Portions of this information were initially documented by the CMA and reviewed by me for thoroughness and accuracy.      [name], MD  [facility] [phone] (phone) [phone] (fax)  [place]

## 2020-05-27 ENCOUNTER — Other Ambulatory Visit: Payer: Self-pay

## 2020-05-27 ENCOUNTER — Ambulatory Visit (INDEPENDENT_AMBULATORY_CARE_PROVIDER_SITE_OTHER): Payer: Medicare Other | Admitting: Family Medicine

## 2020-05-27 VITALS — BP 166/84 | HR 65 | Temp 98.3°F | Ht 71.0 in | Wt 147.0 lb

## 2020-05-27 DIAGNOSIS — K5080 Crohn's disease of both small and large intestine without complications: Secondary | ICD-10-CM | POA: Diagnosis not present

## 2020-05-27 DIAGNOSIS — E1129 Type 2 diabetes mellitus with other diabetic kidney complication: Secondary | ICD-10-CM | POA: Diagnosis not present

## 2020-05-27 DIAGNOSIS — Z1152 Encounter for screening for COVID-19: Secondary | ICD-10-CM

## 2020-05-27 DIAGNOSIS — Z794 Long term (current) use of insulin: Secondary | ICD-10-CM | POA: Diagnosis not present

## 2020-05-28 LAB — COMPREHENSIVE METABOLIC PANEL
ALT: 6 IU/L (ref 0–44)
AST: 11 IU/L (ref 0–40)
Albumin/Globulin Ratio: 1.3 (ref 1.2–2.2)
Albumin: 4.5 g/dL (ref 3.8–4.9)
Alkaline Phosphatase: 248 IU/L — ABNORMAL HIGH (ref 48–121)
BUN/Creatinine Ratio: 5 — ABNORMAL LOW (ref 9–20)
BUN: 23 mg/dL (ref 6–24)
Bilirubin Total: 0.5 mg/dL (ref 0.0–1.2)
CO2: 26 mmol/L (ref 20–29)
Calcium: 9.7 mg/dL (ref 8.7–10.2)
Chloride: 97 mmol/L (ref 96–106)
Creatinine, Ser: 5.03 mg/dL — ABNORMAL HIGH (ref 0.76–1.27)
GFR calc Af Amer: 14 mL/min/{1.73_m2} — ABNORMAL LOW (ref >59)
GFR calc non Af Amer: 12 mL/min/{1.73_m2} — ABNORMAL LOW (ref >59)
Globulin, Total: 3.5 g/dL (ref 1.5–4.5)
Glucose: 139 mg/dL — ABNORMAL HIGH (ref 65–99)
Potassium: 3.9 mmol/L (ref 3.5–5.2)
Sodium: 140 mmol/L (ref 134–144)
Total Protein: 8 g/dL (ref 6.0–8.5)

## 2020-05-28 LAB — HEMOGLOBIN A1C
Est. average glucose Bld gHb Est-mCnc: 111 mg/dL
Hgb A1c MFr Bld: 5.5 % (ref 4.8–5.6)

## 2020-05-28 LAB — SARS-COV-2 SEMI-QUANTITATIVE TOTAL ANTIBODY, SPIKE
SARS-CoV-2 Semi-Quant Total Ab: 951.1 U/mL (ref <0.8)
SARS-CoV-2 Spike Ab Interp: POSITIVE

## 2020-05-28 LAB — LIPID PANEL
Chol/HDL Ratio: 2.2 ratio (ref 0.0–5.0)
Cholesterol, Total: 134 mg/dL (ref 100–199)
HDL: 62 mg/dL (ref >39)
LDL Chol Calc (NIH): 56 mg/dL (ref 0–99)
Triglycerides: 82 mg/dL (ref 0–149)
VLDL Cholesterol Cal: 16 mg/dL (ref 5–40)

## 2020-06-18 NOTE — Progress Notes (Signed)
Subjective:   [name] is a 59 y.o. male who presents for an Initial Medicare Annual Wellness Visit.  I connected with [name] today by telephone and verified that I am speaking with the correct person using two identifiers. Location patient: home Location provider: work Persons participating in the virtual visit: patient, provider.   I discussed the limitations, risks, security and privacy concerns of performing an evaluation and management service by telephone and the availability of in person appointments. I also discussed with the patient that there may be a patient responsible charge related to this service. The patient expressed understanding and verbally consented to this telephonic visit.    Interactive audio and video telecommunications were attempted between this provider and patient, however failed, due to patient having technical difficulties OR patient did not have access to video capability.  We continued and completed visit with audio only.   Review of Systems    N/A  Cardiac Risk Factors include: advanced age (>9mn, >>5women);diabetes mellitus;hypertension;male gender;dyslipidemia     Objective:    Today's Vitals   06/19/20 0957  PainSc: 2    There is no height or weight on file to calculate BMI.  Advanced Directives 06/19/2020 03/20/2020 01/05/2019 01/05/2019 01/05/2019 06/05/2018 12/04/2017  Does Patient Have a Medical Advance Directive? _0  No No  Would patient like information on creating a medical advance directive? No - Patient declined - No - Patient declined - No - Patient declined No - Patient declined No - Patient declined    Current Medications (verified) Outpatient Encounter Medications as of 06/19/2020  Medication Sig  . Adalimumab ([name] PEN) 40 MG/0.4ML PNKT Inject 40 mg into the muscle once a week.  . Alcohol Swabs PADS Use as directed to check blood sugar three times daily for insulin dependent type 2 diabetes.  .Marland KitchenamLODipine  (NORVASC) 10 MG tablet TAKE 1 TABLET(10 MG) BY MOUTH DAILY AS NEEDED  . atorvastatin (LIPITOR) 80 MG tablet Take 1 tablet (80 mg total) by mouth daily.  .Lorin Picket1 GM 210 MG(Fe) tablet Take 420 mg by mouth 3 (three) times daily.  . Blood Glucose Monitoring Suppl (ONE TOUCH ULTRA 2) w/Device KIT Use as directed to check blood sugar three times daily. E11.9  . carvedilol (COREG) 25 MG tablet Take 25 mg by mouth 2 (two) times daily.  . furosemide (LASIX) 80 MG tablet TAKE 1 TABLET(80 MG) BY MOUTH TWICE DAILY  . Insulin Glargine (LANTUS SOLOSTAR) 100 UNIT/ML Solostar Pen Inject 10 Units into the skin every evening.  . levETIRAcetam (KEPPRA) 250 MG tablet TK 1 T PO BID  . lidocaine-prilocaine (EMLA) cream Apply 1 application topically as needed.  .Marland Kitchenlisinopril (ZESTRIL) 10 MG tablet Take 10 mg by mouth daily.   .Marland Kitchengabapentin (NEURONTIN) 100 MG capsule Take 1 capsule (100 mg total) by mouth 2 (two) times daily.  . irbesartan (AVAPRO) 75 MG tablet Take 1 tablet by mouth daily.  . sevelamer carbonate (RENVELA) 800 MG tablet Take 1 tablet (800 mg total) by mouth 3 (three) times daily.  .Marland Kitchensulfamethoxazole-trimethoprim (BACTRIM) 400-80 MG tablet TAKE 1 TABLET BY MOUTH TWICE DAILY AS NEEDED FOR 10 TO 14 DAYS FOR FLARES (Patient not taking: Reported on 06/19/2020)   No facility-administered encounter medications on file as of 06/19/2020.    Allergies (verified) Methotrexate, Vancomycin, Cefepime, and Tape   History: Past Medical History:  Diagnosis Date  . Crohn disease ([place]   . Diabetes mellitus without complication ([place]   .  DVT of lower extremity (deep venous thrombosis) ([place]) 2016  . Empyema ([place]) 05/20/2017  . Encephalopathy 12/04/2017  . Hidradenitis suppurativa   . ICH (intracerebral hemorrhage) ([place])   . Peritonitis ([place]) 04/21/2017  . Pyogenic arthritis of knee ([place]) 02/04/2016  . Renal disorder   . Sepsis ([place]) 01/12/2018   Past Surgical History:  Procedure Laterality Date  . ABDOMINAL  SURGERY    . AMPUTATION FINGER Left 06/2019   [facility]  . ANGIOPLASTY Left    left [facility]pop at [facility] 04-11-2018  . BELOW KNEE LEG AMPUTATION Right 08/2017   [facility]  . DIALYSIS/PERMA CATHETER INSERTION N/A 12/09/2017   Procedure: DIALYSIS/PERMA CATHETER INSERTION;  Surgeon: Katha Cabal, MD;  Location: [place] [facility];  Service: Cardiovascular;  Laterality: N/A;  . DIALYSIS/PERMA CATHETER INSERTION N/A 12/12/2017   Procedure: DIALYSIS/PERMA CATHETER INSERTION;  Surgeon: Algernon Huxley, MD;  Location: [place] [facility];  Service: Cardiovascular;  Laterality: N/A;  . DIALYSIS/PERMA CATHETER REMOVAL Left 12/09/2017   Procedure: DIALYSIS/PERMA CATHETER REMOVAL;  Surgeon: Katha Cabal, MD;  Location: [place] [facility];  Service: Cardiovascular;  Laterality: Left;  . KNEE SURGERY Left 02/04/2016   [facility]  . LEG AMPUTATION THROUGH LOWER TIBIA AND FIBULA Left 06/22/2018   [facility]  . LOWER EXTREMITY ANGIOGRAPHY Right 08/08/2017   Procedure: Lower Extremity Angiography;  Surgeon: Algernon Huxley, MD;  Location: [place] [facility];  Service: Cardiovascular;  Laterality: Right;  . LOWER EXTREMITY ANGIOGRAPHY Right 08/22/2017   Procedure: Lower Extremity Angiography;  Surgeon: Algernon Huxley, MD;  Location: [place] [facility];  Service: Cardiovascular;  Laterality: Right;  . LOWER EXTREMITY INTERVENTION  08/08/2017   Procedure: LOWER EXTREMITY INTERVENTION;  Surgeon: Algernon Huxley, MD;  Location: [place] [facility];  Service: Cardiovascular;;  . LOWER EXTREMITY INTERVENTION  08/22/2017   Procedure: LOWER EXTREMITY INTERVENTION;  Surgeon: Algernon Huxley, MD;  Location: [place] [facility];  Service: Cardiovascular;;   Family History  Problem Relation Age of Onset  . Irritable bowel syndrome Sister   . Diabetes Sister   . Heart disease Mother   . Diabetes Mother   . Heart disease Father   . Rheumatic fever Father        as child  . Psoriasis Brother   . Arthritis  Brother   . Diabetes Sister   . Diabetes Sister    Social History   Socioeconomic History  . Marital status: Married    Spouse name: Not on file  . Number of children: 2  . Years of education: Not on file  . Highest education level: Associate degree: occupational, Hotel manager, or vocational program  Occupational History  . Occupation: disable  Tobacco Use  . Smoking status: Never Smoker  . Smokeless tobacco: Never Used  . Tobacco comment: smokes marijuana  Substance and Sexual Activity  . Alcohol use: No  . Drug use: Yes    Types: Marijuana  . Sexual activity: Not Currently  Other Topics Concern  . Not on file  Social History Narrative  . Not on file   Social Determinants of Health   Financial Resource Strain: Low Risk   . Difficulty of Paying Living Expenses: Not hard at all  Food Insecurity: No Food Insecurity  . Worried About Charity fundraiser in the Last Year: Never true  . Ran Out of Food in the Last Year: Never true  Transportation Needs: No Transportation Needs  . Lack of Transportation (Medical):  No  . Lack of Transportation (Non-Medical): No  Physical Activity: Inactive  . Days of Exercise per Week: 0 days  . Minutes of Exercise per Session: 0 min  Stress: No Stress Concern Present  . Feeling of Stress : Only a little  Social Connections: Moderately Isolated  . Frequency of Communication with Friends and Family: Twice a week  . Frequency of Social Gatherings with Friends and Family: Three times a week  . Attends Religious Services: Never  . Active Member of Clubs or Organizations: No  . Attends Archivist Meetings: Never  . Marital Status: Married    Tobacco Counseling Counseling given: Not Answered Comment: smokes marijuana   Clinical Intake:  Pre-visit preparation completed: Yes  Pain : 0-10 Pain Score: 2  Pain Type: Chronic pain (Due HS.) Pain Location: Groin Pain Descriptors / Indicators: Aching Pain Frequency: Constant Pain  Relieving Factors: Takes Tylenol an needed for pain.  Pain Relieving Factors: Takes Tylenol an needed for pain.  Nutritional Risks: None Diabetes: Yes  How often do you need to have someone help you when you read instructions, pamphlets, or other written materials from your doctor or pharmacy?: 1 - Never  Diabetic? Yes  Nutrition Risk Assessment:  Has the patient had any N/V/D within the last 2 months?  No  Does the patient have any non-healing wounds?  No  Has the patient had any unintentional weight loss or weight gain?  No   Diabetes:  Is the patient diabetic?  Yes  If diabetic, was a CBG obtained today?  No  Did the patient bring in their glucometer from home?  No  How often do you monitor your CBG's? A couple times weekly.   Financial Strains and Diabetes Management:  Are you having any financial strains with the device, your supplies or your medication? No .  Does the patient want to be seen by Chronic Care Management for management of their diabetes?  No  Would the patient like to be referred to a Nutritionist or for Diabetic Management?  No   Diabetic Exams:  Diabetic Eye Exam: Overdue for diabetic eye exam. Pt has been advised about the importance in completing this exam. Patient advised to call and schedule an eye exam.  Diabetic foot exam: Does not qualify due to amputation.   Interpreter Needed?: No  Information entered by :: [facility], LPN   Activities of Daily Living In your present state of health, do you have any difficulty performing the following activities: 06/19/2020 04/15/2020  Hearing? N N  Vision? N N  Comment Wears eye glasses. -  Difficulty concentrating or making decisions? N Y  Walking or climbing stairs? N Y  Dressing or bathing? N Y  Doing errands, shopping? Y Y  Comment Does not drive. -  Preparing Food and eating ? Y -  Comment Wife does all the cooking. -  Using the Toilet? N -  In the past six months, have you accidently leaked  urine? N -  Do you have problems with loss of bowel control? N -  Managing your Medications? N -  Managing your Finances? N -  Housekeeping or managing your Housekeeping? Y -  Comment Wife does all the house work. -  Some recent data might be hidden    Patient Care Team: [name], MD as PCP - General (Family Medicine) Franki Cabot, MD (Neurology) Enriqueta Shutter, MD as Referring Physician (Dermatology) [facility], [facility]) Sharlene Dory, MD as  Referring Physician  Indicate any recent Medical Services you may have received from other than [facility] providers in the past year (date may be approximate).     Assessment:   This is a routine wellness examination for [name].  Hearing/Vision screen No exam data present  Dietary issues and exercise activities discussed: Current Exercise Habits: The patient does not participate in regular exercise at present, Exercise limited by: Other - see comments (Due to amputation.)  Goals    . LIFESTYLE - DECREASE FALLS RISK     Recommend to remove any items from the home that may cause slips or trips.      Depression Screen PHQ 2/9 Scores 06/19/2020 04/15/2020 01/31/2018 08/18/2016  PHQ - 2 Score 1 0 1 0  PHQ- 9 Score - - 4 5    Fall Risk Fall Risk  06/19/2020  Falls in the past year? 1  Number falls in past yr: 1  Injury with Fall? 0  Risk for fall due to : Impaired mobility  Risk for fall due to: Comment Is an amputee.  Follow up Falls prevention discussed    Any stairs in or around the home? Yes  If so, are there any without handrails? No  Home free of loose throw rugs in walkways, pet beds, electrical cords, etc? Yes  Adequate lighting in your home to reduce risk of falls? Yes   ASSISTIVE DEVICES UTILIZED TO PREVENT FALLS:  Life alert? No  Use of a cane, walker or w/c? Yes  Grab bars in the bathroom? No  Shower chair or bench in shower? Yes  Elevated toilet seat or a handicapped toilet? Yes     Cognitive Function: Declined today.        Immunizations Immunization History  Administered Date(s) Administered  . Influenza,inj,Quad PF,6+ Mos 07/21/2017  . Influenza-Unspecified 07/18/2016, 06/27/2018, 07/25/2019  . Moderna SARS-COVID-2 Vaccination 12/24/2019, 01/21/2020  . Pneumococcal-Unspecified 10/01/2011  . Tdap 01/31/2018    TDAP status: Up to date Flu Vaccine status: Up to date Pneumococcal vaccine status: Up to date Covid-19 vaccine status: Completed vaccines  Qualifies for Shingles Vaccine? Yes   Zostavax completed No   Shingrix Completed?: No.    Education has been provided regarding the importance of this vaccine. Patient has been advised to call insurance company to determine out of pocket expense if they have not yet received this vaccine. Advised may also receive vaccine at local pharmacy or Health Dept. Verbalized acceptance and understanding.  Screening Tests Health Maintenance  Topic Date Due  . PNEUMOCOCCAL POLYSACCHARIDE VACCINE AGE 75-64 HIGH RISK  Never done  . COLONOSCOPY  Never done  . OPHTHALMOLOGY EXAM  10/20/2018  . INFLUENZA VACCINE  05/18/2020  . HEMOGLOBIN A1C  11/27/2020  . TETANUS/TDAP  02/01/2028  . COVID-19 Vaccine  Completed  . Hepatitis C Screening  Completed  . HIV Screening  Completed    Health Maintenance  Health Maintenance Due  Topic Date Due  . PNEUMOCOCCAL POLYSACCHARIDE VACCINE AGE 75-64 HIGH RISK  Never done  . COLONOSCOPY  Never done  . OPHTHALMOLOGY EXAM  10/20/2018  . INFLUENZA VACCINE  05/18/2020    Colorectal cancer screening: Referral to GI placed today. Pt aware the office will call re: appt.  Lung Cancer Screening: (Low Dose CT Chest recommended if Age 57-80 years, 30 pack-year currently smoking OR have quit w/in 15years.) does not qualify.    Additional Screening:  Hepatitis C Screening: Up to date  Vision Screening: Recommended annual ophthalmology exams for early detection  of glaucoma and other  disorders of the eye. Is the patient up to date with their annual eye exam?  Yes  Who is the provider or what is the name of the office in which the patient attends annual eye exams? [facility] If pt is not established with a provider, would they like to be referred to a provider to establish care? No .   Dental Screening: Recommended annual dental exams for proper oral hygiene  Community Resource Referral / Chronic Care Management: CRR required this visit?  No   CCM required this visit?  No      Plan:     I have personally reviewed and noted the following in the patient's chart:   . Medical and social history . Use of alcohol, tobacco or illicit drugs  . Current medications and supplements . Functional ability and status . Nutritional status . Physical activity . Advanced directives . List of other physicians . Hospitalizations, surgeries, and ER visits in previous 12 months . Vitals . Screenings to include cognitive, depression, and falls . Referrals and appointments  In addition, I have reviewed and discussed with patient certain preventive protocols, quality metrics, and best practice recommendations. A written personalized care plan for preventive services as well as general preventive health recommendations were provided to patient.     Kelsei Defino [place], [place]   11/22/971   Nurse Notes: Pt needs a influenza vaccine when available. Pt plans to set up an eye exam this year.

## 2020-06-19 ENCOUNTER — Ambulatory Visit (INDEPENDENT_AMBULATORY_CARE_PROVIDER_SITE_OTHER): Payer: Medicare Other

## 2020-06-19 ENCOUNTER — Other Ambulatory Visit: Payer: Self-pay

## 2020-06-19 DIAGNOSIS — Z1211 Encounter for screening for malignant neoplasm of colon: Secondary | ICD-10-CM | POA: Diagnosis not present

## 2020-06-19 DIAGNOSIS — Z0000 Encounter for general adult medical examination without abnormal findings: Secondary | ICD-10-CM | POA: Diagnosis not present

## 2020-06-19 NOTE — Patient Instructions (Signed)
[name] , Thank you for taking time to come for your Medicare Wellness Visit. I appreciate your ongoing commitment to your health goals. Please review the following plan we discussed and let me know if I can assist you in the future.   Screening recommendations/referrals: Colonoscopy: Referral to GI placed today. Pt aware the office will call re: appt. Recommended yearly ophthalmology/optometry visit for glaucoma screening and checkup Recommended yearly dental visit for hygiene and checkup  Vaccinations: Influenza vaccine: Due fall 2021 Pneumococcal vaccine: Completed series Tdap vaccine: Up to date, due 01/2028 Shingles vaccine: Shingrix discussed. Please contact your pharmacy for coverage information.     Advanced directives: Advance directive discussed with you today. Even though you declined this today please call our office should you change your mind and we can give you the proper paperwork for you to fill out.  Conditions/risks identified: Fall risk prevention discussed today.   Next appointment: 08/19/20 @ 1:40 PM with Dr [name]. Declined scheduling an AWV for 2022 at this time.   Preventive Care 58 Years and Older, Male Preventive care refers to lifestyle choices and visits with your health care provider that can promote health and wellness. What does preventive care include?  A yearly physical exam. This is also called an annual well check.  Dental exams once or twice a year.  Routine eye exams. Ask your health care provider how often you should have your eyes checked.  Personal lifestyle choices, including:  Daily care of your teeth and gums.  Regular physical activity.  Eating a healthy diet.  Avoiding tobacco and drug use.  Limiting alcohol use.  Practicing safe sex.  Taking low doses of aspirin every day.  Taking vitamin and mineral supplements as recommended by your health care provider. What happens during an annual well check? The services and  screenings done by your health care provider during your annual well check will depend on your age, overall health, lifestyle risk factors, and family history of disease. Counseling  Your health care provider may ask you questions about your:  Alcohol use.  Tobacco use.  Drug use.  Emotional well-being.  Home and relationship well-being.  Sexual activity.  Eating habits.  History of falls.  Memory and ability to understand (cognition).  Work and work Statistician. Screening  You may have the following tests or measurements:  Height, weight, and BMI.  Blood pressure.  Lipid and cholesterol levels. These may be checked every 5 years, or more frequently if you are over 66 years old.  Skin check.  Lung cancer screening. You may have this screening every year starting at age 58 if you have a 30-pack-year history of smoking and currently smoke or have quit within the past 15 years.  Fecal occult blood test (FOBT) of the stool. You may have this test every year starting at age 58.  Flexible sigmoidoscopy or colonoscopy. You may have a sigmoidoscopy every 5 years or a colonoscopy every 10 years starting at age 69.  Prostate cancer screening. Recommendations will vary depending on your family history and other risks.  Hepatitis C blood test.  Hepatitis B blood test.  Sexually transmitted disease (STD) testing.  Diabetes screening. This is done by checking your blood sugar (glucose) after you have not eaten for a while (fasting). You may have this done every 1-3 years.  Abdominal aortic aneurysm (AAA) screening. You may need this if you are a current or former smoker.  Osteoporosis. You may be screened starting at age 7  if you are at high risk. Talk with your health care provider about your test results, treatment options, and if necessary, the need for more tests. Vaccines  Your health care provider may recommend certain vaccines, such as:  Influenza vaccine. This is  recommended every year.  Tetanus, diphtheria, and acellular pertussis (Tdap, Td) vaccine. You may need a Td booster every 10 years.  Zoster vaccine. You may need this after age 58.  Pneumococcal 13-valent conjugate (PCV13) vaccine. One dose is recommended after age 58.  Pneumococcal polysaccharide (PPSV23) vaccine. One dose is recommended after age 58. Talk to your health care provider about which screenings and vaccines you need and how often you need them. This information is not intended to replace advice given to you by your health care provider. Make sure you discuss any questions you have with your health care provider. Document Released: 10/31/2015 Document Revised: 06/23/2016 Document Reviewed: 08/05/2015 Elsevier Interactive Patient Education  2017 [employer] Prevention in the Home Falls can cause injuries. They can happen to people of all ages. There are many things you can do to make your home safe and to help prevent falls. What can I do on the outside of my home?  Regularly fix the edges of walkways and driveways and fix any cracks.  Remove anything that might make you trip as you walk through a door, such as a raised step or threshold.  Trim any bushes or trees on the path to your home.  Use bright outdoor lighting.  Clear any walking paths of anything that might make someone trip, such as rocks or tools.  Regularly check to see if handrails are loose or broken. Make sure that both sides of any steps have handrails.  Any raised decks and porches should have guardrails on the edges.  Have any leaves, snow, or ice cleared regularly.  Use sand or salt on walking paths during winter.  Clean up any spills in your garage right away. This includes oil or grease spills. What can I do in the bathroom?  Use night lights.  Install grab bars by the toilet and in the tub and shower. Do not use towel bars as grab bars.  Use non-skid mats or decals in the tub or  shower.  If you need to sit down in the shower, use a plastic, non-slip stool.  Keep the floor dry. Clean up any water that spills on the floor as soon as it happens.  Remove soap buildup in the tub or shower regularly.  Attach bath mats securely with double-sided non-slip rug tape.  Do not have throw rugs and other things on the floor that can make you trip. What can I do in the bedroom?  Use night lights.  Make sure that you have a light by your bed that is easy to reach.  Do not use any sheets or blankets that are too big for your bed. They should not hang down onto the floor.  Have a firm chair that has side arms. You can use this for support while you get dressed.  Do not have throw rugs and other things on the floor that can make you trip. What can I do in the kitchen?  Clean up any spills right away.  Avoid walking on wet floors.  Keep items that you use a lot in easy-to-reach places.  If you need to reach something above you, use a strong step stool that has a grab bar.  Keep electrical  cords out of the way.  Do not use floor polish or wax that makes floors slippery. If you must use wax, use non-skid floor wax.  Do not have throw rugs and other things on the floor that can make you trip. What can I do with my stairs?  Do not leave any items on the stairs.  Make sure that there are handrails on both sides of the stairs and use them. Fix handrails that are broken or loose. Make sure that handrails are as long as the stairways.  Check any carpeting to make sure that it is firmly attached to the stairs. Fix any carpet that is loose or worn.  Avoid having throw rugs at the top or bottom of the stairs. If you do have throw rugs, attach them to the floor with carpet tape.  Make sure that you have a light switch at the top of the stairs and the bottom of the stairs. If you do not have them, ask someone to add them for you. What else can I do to help prevent  falls?  Wear shoes that:  Do not have high heels.  Have rubber bottoms.  Are comfortable and fit you well.  Are closed at the toe. Do not wear sandals.  If you use a stepladder:  Make sure that it is fully opened. Do not climb a closed stepladder.  Make sure that both sides of the stepladder are locked into place.  Ask someone to hold it for you, if possible.  Clearly mark and make sure that you can see:  Any grab bars or handrails.  First and last steps.  Where the edge of each step is.  Use tools that help you move around (mobility aids) if they are needed. These include:  Canes.  Walkers.  Scooters.  Crutches.  Turn on the lights when you go into a dark area. Replace any light bulbs as soon as they burn out.  Set up your furniture so you have a clear path. Avoid moving your furniture around.  If any of your floors are uneven, fix them.  If there are any pets around you, be aware of where they are.  Review your medicines with your doctor. Some medicines can make you feel dizzy. This can increase your chance of falling. Ask your doctor what other things that you can do to help prevent falls. This information is not intended to replace advice given to you by your health care provider. Make sure you discuss any questions you have with your health care provider. Document Released: 07/31/2009 Document Revised: 03/11/2016 Document Reviewed: 11/08/2014 Elsevier Interactive Patient Education  2017 [employer].

## 2020-06-25 ENCOUNTER — Other Ambulatory Visit: Payer: Self-pay | Admitting: Family Medicine

## 2020-06-25 DIAGNOSIS — Z8673 Personal history of transient ischemic attack (TIA), and cerebral infarction without residual deficits: Secondary | ICD-10-CM

## 2020-06-25 NOTE — Telephone Encounter (Signed)
Requested Prescriptions  Pending Prescriptions Disp Refills  . atorvastatin (LIPITOR) 80 MG tablet [Pharmacy Med Name: ATORVASTATIN 80MG TABLETS] 90 tablet 3    Sig: TAKE 1 TABLET(80 MG) BY MOUTH DAILY     Cardiovascular:  Antilipid - Statins Failed - 06/25/2020  3:21 AM      Failed - LDL in normal range and within 360 days    LDL Chol Calc (NIH)  Date Value Ref Range Status  05/27/2020 56 0 - 99 mg/dL Final         Passed - Total Cholesterol in normal range and within 360 days    Cholesterol, Total  Date Value Ref Range Status  05/27/2020 134 100 - 199 mg/dL Final         Passed - HDL in normal range and within 360 days    HDL  Date Value Ref Range Status  05/27/2020 62 >39 mg/dL Final         Passed - Triglycerides in normal range and within 360 days    Triglycerides  Date Value Ref Range Status  05/27/2020 82 0 - 149 mg/dL Final         Passed - Patient is not pregnant      Passed - Valid encounter within last 12 months    Recent Outpatient Visits          4 weeks ago Type 2 diabetes mellitus with other diabetic kidney complication, with long-term current use of insulin ([place])   [facility] [name], MD   2 months ago Type 2 diabetes mellitus with other diabetic kidney complication, with long-term current use of insulin ([place])   [facility] [name], MD   6 months ago Type 2 diabetes mellitus with other diabetic kidney complication, with long-term current use of insulin ([place])   [facility] [name], MD   9 months ago Peripheral vascular disease [facility])   [facility] [name], MD   11 months ago Type 2 diabetes mellitus with other diabetic kidney complication, with long-term current use of insulin [facility])   [place], [name], MD      Future Appointments            In 2 days  [place]   In 1 month [name], [name], MD [facility], [place]

## 2020-06-27 ENCOUNTER — Telehealth: Payer: Self-pay

## 2020-06-27 ENCOUNTER — Other Ambulatory Visit: Payer: Self-pay | Admitting: Family Medicine

## 2020-06-27 ENCOUNTER — Telehealth (INDEPENDENT_AMBULATORY_CARE_PROVIDER_SITE_OTHER): Payer: Self-pay

## 2020-06-27 DIAGNOSIS — I10 Essential (primary) hypertension: Secondary | ICD-10-CM

## 2020-06-27 DIAGNOSIS — R6889 Other general symptoms and signs: Secondary | ICD-10-CM

## 2020-06-27 MED ORDER — CARVEDILOL 25 MG PO TABS: 25.0000 mg | ORAL_TABLET | Freq: Two times a day (BID) | ORAL | 12 refills | Status: AC

## 2020-06-27 NOTE — Telephone Encounter (Signed)
Requested medication (s) are due for refill today -yes  Requested medication (s) are on the active medication list -yes  Future visit scheduled -yes  Last refill: 04/09/20  Notes to clinic: Request for RF of medication prescribed by historical provider  Requested Prescriptions  Pending Prescriptions Disp Refills   carvedilol (COREG) 25 MG tablet      Sig: Take 1 tablet (25 mg total) by mouth 2 (two) times daily.      Cardiovascular:  Beta Blockers Failed - 06/27/2020 11:10 AM      Failed - Last BP in normal range    BP Readings from Last 1 Encounters:  05/27/20 (!) 166/84          Passed - Last Heart Rate in normal range    Pulse Readings from Last 1 Encounters:  05/27/20 65          Passed - Valid encounter within last 6 months    Recent Outpatient Visits           1 month ago Type 2 diabetes mellitus with other diabetic kidney complication, with long-term current use of insulin ([place])   [facility] [name], MD   2 months ago Type 2 diabetes mellitus with other diabetic kidney complication, with long-term current use of insulin ([place])   [facility] [name], MD   6 months ago Type 2 diabetes mellitus with other diabetic kidney complication, with long-term current use of insulin [facility])   [facility] [name], MD   9 months ago Peripheral vascular disease [facility])   [facility] [name], MD   11 months ago Type 2 diabetes mellitus with other diabetic kidney complication, with long-term current use of insulin [facility])   [facility] [name], MD       Future Appointments             In 1 month [name], [name], MD [facility]                Requested Prescriptions  Pending Prescriptions Disp Refills   carvedilol (COREG) 25 MG tablet      Sig: Take 1 tablet (25 mg total) by mouth 2 (two) times daily.      Cardiovascular:  Beta  Blockers Failed - 06/27/2020 11:10 AM      Failed - Last BP in normal range    BP Readings from Last 1 Encounters:  05/27/20 (!) 166/84          Passed - Last Heart Rate in normal range    Pulse Readings from Last 1 Encounters:  05/27/20 65          Passed - Valid encounter within last 6 months    Recent Outpatient Visits           1 month ago Type 2 diabetes mellitus with other diabetic kidney complication, with long-term current use of insulin ([place])   [facility] [name], MD   2 months ago Type 2 diabetes mellitus with other diabetic kidney complication, with long-term current use of insulin [facility])   [facility] [name], MD   6 months ago Type 2 diabetes mellitus with other diabetic kidney complication, with long-term current use of insulin [facility])   [facility] [name], MD   9 months ago Peripheral vascular disease [facility])   [facility] [name], MD  11 months ago Type 2 diabetes mellitus with other diabetic kidney complication, with long-term current use of insulin [facility])   [facility] [name], MD       Future Appointments             In 1 month [name], [name], MD [facility], [place]

## 2020-06-27 NOTE — Telephone Encounter (Signed)
Please advise on refill request. It is listed as historical in patient's chart.

## 2020-06-27 NOTE — Telephone Encounter (Signed)
Copied from [place] [id number]. Topic: Quick Communication - See Telephone Encounter >> Jun 27, 2020 11:28 AM Blase Mess A wrote: CRM for notification. See Telephone encounter for: 06/27/20.Patient's wife, [name] is calling to speak to Dr. Maralyn Sago nurse for advise. Patient's wife has concerns regarding the patient's memory, depression, and wanting to drive. Wife has seen a decline in the patient's memory in the last 3 months. Patient is in denial about his memory, and past does not want to eat and has lost a lot of weight.  [name] does not know what to do. [name] is wanting a referral for neurologist with [facility].Please advise Cb- [phone]

## 2020-06-27 NOTE — Telephone Encounter (Signed)
[name],  I contacted patient to discuss scheduling colonoscopy. Pt gave me permission to discuss with wife.  During our conversation and my chart review I noticed that this patient has Crohn's.  I asked his wife if he was having any GI issues.  She stated that he is having a Crohn's flare up because he has been out of [name] for 2 weeks and has not been able to get it.  I then asked so he has a GI doctor she said she thought he did.  He does not have a GI doctor because the [name] was prescribed by Dr.  Edison Simon who is [facility].  Wife is very frustrated and concerned.  She has contacted Dr. Louanne Skye office several times.  They have not called her back.  Since he's is experiencing diarrhea, and has Crohn's I did not know how we should address this going forward to schedule a colonoscopy because he needs a GI physician.  I've included his PCP Dr. Caryn Section on this message.   Please advise.  Thanks,  [place], [place]

## 2020-06-27 NOTE — Telephone Encounter (Signed)
Dr Caryn Section  This patient needs to be evaluated in GI clinic first If he is going through flare up and having diarrhea, please do stool studies to rule out infection, if negative, start him on short course of prednisone 76m daily until he sees one of [place]phas Dar[place] [address] [place] [id number] Main: [phone] Fax: [phone]: [phone]

## 2020-06-27 NOTE — Telephone Encounter (Signed)
Medication: carvedilol (COREG) 25 MG tablet [[id number]]  Has the patient contacted their pharmacy? YES (Agent: If no, request that the patient contact the pharmacy for the refill.) (Agent: If yes, when and what did the pharmacy advise?)  Preferred Pharmacy (with phone number or street name): [facility] [place], [place] - [address]  Phone:  [phone] Fax:  [phone]     Agent: Please be advised that RX refills may take up to 3 business days. We ask that you follow-up with your pharmacy.

## 2020-06-30 NOTE — Telephone Encounter (Signed)
Orders placed.

## 2020-07-22 NOTE — Telephone Encounter (Signed)
Appt was scheduled for patient.    Thanks, [place], [place]

## 2020-07-29 ENCOUNTER — Other Ambulatory Visit: Payer: Self-pay | Admitting: Family Medicine

## 2020-08-19 ENCOUNTER — Other Ambulatory Visit: Payer: Self-pay

## 2020-08-19 ENCOUNTER — Ambulatory Visit (INDEPENDENT_AMBULATORY_CARE_PROVIDER_SITE_OTHER): Payer: Medicare Other | Admitting: Family Medicine

## 2020-08-19 VITALS — BP 167/67 | HR 52 | Temp 98.3°F | Wt 145.0 lb

## 2020-08-19 DIAGNOSIS — Z794 Long term (current) use of insulin: Secondary | ICD-10-CM

## 2020-08-19 DIAGNOSIS — E1129 Type 2 diabetes mellitus with other diabetic kidney complication: Secondary | ICD-10-CM

## 2020-08-19 DIAGNOSIS — I10 Essential (primary) hypertension: Secondary | ICD-10-CM | POA: Diagnosis not present

## 2020-08-19 DIAGNOSIS — R29898 Other symptoms and signs involving the musculoskeletal system: Secondary | ICD-10-CM

## 2020-08-19 DIAGNOSIS — F32A Depression, unspecified: Secondary | ICD-10-CM | POA: Diagnosis not present

## 2020-08-19 MED ORDER — BUPROPION HCL ER (XL) 150 MG PO TB24: 150.0000 mg | ORAL_TABLET | Freq: Every day | ORAL | 0 refills | Status: DC

## 2020-08-19 NOTE — Progress Notes (Signed)
Established patient visit   Patient: [name]   DOB: 1961/04/01   59 y.o. Male  MRN: [id number] Visit Date: 08/19/2020  Today's healthcare provider: [name], MD   No chief complaint on file.  Subjective    HPI  Hypertension, follow-up  BP Readings from Last 3 Encounters:  08/19/20 (!) 167/67  05/27/20 (!) 166/84  04/15/20 (!) 190/66   Wt Readings from Last 3 Encounters:  08/19/20 145 lb (65.8 kg)  05/27/20 147 lb (66.7 kg)  04/15/20 150 lb (68 kg)     He was last seen for hypertension 4 months ago.  BP at that visit was 190/66. Management since that visit includes continue same medications.  He reports good compliance with treatment. He is not having side effects.  He is following a Regular diet. He is exercising. He does not smoke.  Use of agents associated with hypertension: none.   Outside blood pressures are not being checked  . Symptoms: No chest pain No chest pressure  No palpitations No syncope  No dyspnea No orthopnea  No paroxysmal nocturnal dyspnea No lower extremity edema   Pertinent labs: Lab Results  Component Value Date   CHOL 134 05/27/2020   HDL 62 05/27/2020   LDLCALC 56 05/27/2020   TRIG 82 05/27/2020   CHOLHDL 2.2 05/27/2020   Lab Results  Component Value Date   NA 140 05/27/2020   K 3.9 05/27/2020   CREATININE 5.03 (H) 05/27/2020   GFRNONAA 12 (L) 05/27/2020   GFRAA 14 (L) 05/27/2020   GLUCOSE 139 (H) 05/27/2020     The ASCVD Risk score Mikey Bussing DC Jr., et al., 2013) failed to calculate for the following reasons:   The patient has a prior MI or stroke diagnosis   --------------------------------------------------------------------------------------------------- He was previously on insulin for diabetes, last a1c was 5.5 on 05/27/2020. His wife is checking his blood sugar and reports it typically runs in the 140s to 150s when she checks it.   Follow up for Seizure disorder:  The patient was last seen for this 4  months ago. Changes made at last visit include none; continue same medication.  Patient states has not had any seizures recently.   ----------------------------------------------------------------------------------------- Wife is very concerned that the patient is declining in health and does not tell his doctors how bad he is.  She became upset during the visit because she said he does not want her to say anything about how bad he is getting.  Patient admits to feeling very fatigued and somewhat depressed. He does not sleep well.       Medications: Outpatient Medications Prior to Visit  Medication Sig Note  . Adalimumab ([name] PEN) 40 MG/0.4ML PNKT Inject 40 mg into the muscle once a week.    . Alcohol Swabs PADS Use as directed to check blood sugar three times daily for insulin dependent type 2 diabetes.   Marland Kitchen amLODipine (NORVASC) 10 MG tablet TAKE 1 TABLET(10 MG) BY MOUTH DAILY AS NEEDED   . atorvastatin (LIPITOR) 80 MG tablet TAKE 1 TABLET(80 MG) BY MOUTH DAILY   . AURYXIA 1 GM 210 MG(Fe) tablet Take 420 mg by mouth 3 (three) times daily.   . Blood Glucose Monitoring Suppl (ONE TOUCH ULTRA 2) w/Device KIT Use as directed to check blood sugar three times daily. E11.9   . carvedilol (COREG) 25 MG tablet Take 1 tablet (25 mg total) by mouth 2 (two) times daily.   . furosemide (LASIX) 80 MG tablet  TAKE 1 TABLET(80 MG) BY MOUTH TWICE DAILY   . gabapentin (NEURONTIN) 100 MG capsule Take 1 capsule (100 mg total) by mouth 2 (two) times daily. (Patient taking differently: Take 100 mg by mouth 3 (three) times daily. )   . irbesartan (AVAPRO) 75 MG tablet Take 1 tablet by mouth 2 (two) times a week.    . levETIRAcetam (KEPPRA) 250 MG tablet TK 1 T PO BID   . lisinopril (ZESTRIL) 10 MG tablet Take 10 mg by mouth daily.    . sevelamer carbonate (RENVELA) 800 MG tablet Take 1 tablet (800 mg total) by mouth 3 (three) times daily.   Marland Kitchen sulfamethoxazole-trimethoprim (BACTRIM) 400-80 MG tablet TAKE 1  TABLET BY MOUTH TWICE DAILY AS NEEDED FOR 10 TO 14 DAYS FOR FLARES (Patient not taking: Reported on 06/19/2020)   . [DISCONTINUED] Insulin Glargine (LANTUS SOLOSTAR) 100 UNIT/ML Solostar Pen Inject 10 Units into the skin every evening. (Patient not taking: Reported on 08/19/2020) 08/19/2020: previously stopped taking  . [DISCONTINUED] lidocaine-prilocaine (EMLA) cream Apply 1 application topically as needed. (Patient not taking: Reported on 08/19/2020)    No facility-administered medications prior to visit.        Objective    BP (!) 167/67 (BP Location: Right Arm, Patient Position: Sitting, Cuff Size: Normal)   Pulse (!) 52   Temp 98.3 F (36.8 C) (Oral)   Wt 145 lb (65.8 kg)   SpO2 100%   BMI 20.22 kg/m     Physical Exam   General: Appearance:    Thin male sitting in wheelchair in NAD. Requires assistance to stand and walk  Eyes:    PERRL, conjunctiva/corneas clear, EOM's intact       Lungs:     Clear to auscultation bilaterally, respirations unlabored  Heart:    Bradycardic. Normal rhythm. No murmurs, rubs, or gallops.   MS:   Below knee amputation of left lower extremity is noted. Below knee amputation of right lower extremity is noted.   Neurologic:   Awake, alert, oriented x 3. No apparent focal neurological           defect.         Depression screen [facility] 2/9 08/19/2020 06/19/2020 04/15/2020  Decreased Interest 1 0 0  Down, Depressed, Hopeless 0 1 0  PHQ - 2 Score 1 1 0  Altered sleeping 3 - -  Tired, decreased energy 3 - -  Change in appetite 3 - -  Feeling bad or failure about yourself  1 - -  Trouble concentrating 1 - -  Moving slowly or fidgety/restless 2 - -  Suicidal thoughts 0 - -  PHQ-9 Score 14 - -  Difficult doing work/chores Somewhat difficult - -     Assessment & Plan     1. Primary hypertension Uncontrolled. Is primarily managed by nephrologist. Noted that he has had an ARB added to his ACEI. His wife states that his nephrologist is aware he on both  medications. Advised her to double check to make sure that was his intention.   2. Weakness of lower extremity, unspecified laterality  - Ambulatory referral to [place] for physical therapy.   3. Depression, unspecified depression type Start - buPROPion (WELLBUTRIN XL) 150 MG 24 hr tablet; Take 1 tablet (150 mg total) by mouth daily.  Dispense: 90 tablet; Refill: 0  4. Type 2 diabetes mellitus with other diabetic kidney complication, with long-term current use of insulin (HCC) Now off all diabetic medications with most recent A1c being  normal. Patient reports A1c has been checked recently by nephrology and was normal.   Future Appointments  Date Time Provider [place]  09/18/2020  1:45 PM Lin Landsman, MD [facility] None  11/25/2020  1:40 PM Caryn Section, [name], MD [facility]        The entirety of the information documented in the History of Present Illness, Review of Systems and Physical Exam were personally obtained by me. Portions of this information were initially documented by the CMA and reviewed by me for thoroughness and accuracy.      [name], MD  [facility] [phone] (phone) [phone] (fax)  [place]

## 2020-08-19 NOTE — Patient Instructions (Addendum)
.   Please review the attached list of medications and notify my office if there are any errors.    Be sure to check your blood sugar on days you don't have dialysis   Let me know if you HgbA1c from dialysis center gets above 7.5   Double check with your kidney specialist about taking both the lisinopril and irbesartan

## 2020-09-09 DIAGNOSIS — I120 Hypertensive chronic kidney disease with stage 5 chronic kidney disease or end stage renal disease: Secondary | ICD-10-CM

## 2020-09-09 DIAGNOSIS — E1122 Type 2 diabetes mellitus with diabetic chronic kidney disease: Secondary | ICD-10-CM

## 2020-09-09 DIAGNOSIS — K5090 Crohn's disease, unspecified, without complications: Secondary | ICD-10-CM

## 2020-09-09 DIAGNOSIS — N186 End stage renal disease: Secondary | ICD-10-CM

## 2020-09-09 DIAGNOSIS — E1140 Type 2 diabetes mellitus with diabetic neuropathy, unspecified: Secondary | ICD-10-CM

## 2020-09-09 DIAGNOSIS — I872 Venous insufficiency (chronic) (peripheral): Secondary | ICD-10-CM

## 2020-09-09 DIAGNOSIS — F32A Depression, unspecified: Secondary | ICD-10-CM

## 2020-09-09 DIAGNOSIS — D631 Anemia in chronic kidney disease: Secondary | ICD-10-CM

## 2020-09-09 DIAGNOSIS — E1151 Type 2 diabetes mellitus with diabetic peripheral angiopathy without gangrene: Secondary | ICD-10-CM

## 2020-09-09 DIAGNOSIS — I2510 Atherosclerotic heart disease of native coronary artery without angina pectoris: Secondary | ICD-10-CM

## 2020-09-10 ENCOUNTER — Telehealth: Payer: Self-pay

## 2020-09-10 NOTE — Telephone Encounter (Signed)
Copied from [place] [id number]. Topic: Quick Communication - See Telephone Encounter >> Sep 10, 2020 10:28 AM [place] wrote: CRM for notification. See Telephone encounter for: 09/10/20.Shaneka with [facility] calling stating the fax for pt "Plan of care for [facility] "did not come thru, only got first pg, resend to fax [phone] contact # [phone] ext [phone]

## 2020-09-12 ENCOUNTER — Other Ambulatory Visit: Payer: Self-pay

## 2020-09-12 ENCOUNTER — Emergency Department (HOSPITAL_COMMUNITY): Payer: Medicare Other

## 2020-09-12 ENCOUNTER — Inpatient Hospital Stay (HOSPITAL_COMMUNITY)
Admission: EM | Admit: 2020-09-12 | Discharge: 2020-09-14 | DRG: 100 | Disposition: A | Discharge status: Home Health | Payer: Medicare Other | Attending: Student | Admitting: Student

## 2020-09-12 ENCOUNTER — Encounter (HOSPITAL_COMMUNITY): Payer: Self-pay | Admitting: Emergency Medicine

## 2020-09-12 DIAGNOSIS — I5042 Chronic combined systolic (congestive) and diastolic (congestive) heart failure: Secondary | ICD-10-CM | POA: Diagnosis present

## 2020-09-12 DIAGNOSIS — Z888 Allergy status to other drugs, medicaments and biological substances status: Secondary | ICD-10-CM

## 2020-09-12 DIAGNOSIS — N186 End stage renal disease: Secondary | ICD-10-CM | POA: Diagnosis present

## 2020-09-12 DIAGNOSIS — E1140 Type 2 diabetes mellitus with diabetic neuropathy, unspecified: Secondary | ICD-10-CM | POA: Diagnosis present

## 2020-09-12 DIAGNOSIS — E11649 Type 2 diabetes mellitus with hypoglycemia without coma: Secondary | ICD-10-CM

## 2020-09-12 DIAGNOSIS — W19XXXA Unspecified fall, initial encounter: Secondary | ICD-10-CM

## 2020-09-12 DIAGNOSIS — G3184 Mild cognitive impairment, so stated: Secondary | ICD-10-CM | POA: Diagnosis present

## 2020-09-12 DIAGNOSIS — Z833 Family history of diabetes mellitus: Secondary | ICD-10-CM | POA: Diagnosis not present

## 2020-09-12 DIAGNOSIS — N138 Other obstructive and reflux uropathy: Secondary | ICD-10-CM | POA: Diagnosis present

## 2020-09-12 DIAGNOSIS — G40909 Epilepsy, unspecified, not intractable, without status epilepticus: Secondary | ICD-10-CM | POA: Diagnosis present

## 2020-09-12 DIAGNOSIS — Z8673 Personal history of transient ischemic attack (TIA), and cerebral infarction without residual deficits: Secondary | ICD-10-CM | POA: Diagnosis not present

## 2020-09-12 DIAGNOSIS — N189 Chronic kidney disease, unspecified: Secondary | ICD-10-CM | POA: Diagnosis present

## 2020-09-12 DIAGNOSIS — E7800 Pure hypercholesterolemia, unspecified: Secondary | ICD-10-CM | POA: Diagnosis present

## 2020-09-12 DIAGNOSIS — R5381 Other malaise: Secondary | ICD-10-CM | POA: Diagnosis present

## 2020-09-12 DIAGNOSIS — Z79899 Other long term (current) drug therapy: Secondary | ICD-10-CM

## 2020-09-12 DIAGNOSIS — Z89511 Acquired absence of right leg below knee: Secondary | ICD-10-CM | POA: Diagnosis not present

## 2020-09-12 DIAGNOSIS — I6389 Other cerebral infarction: Secondary | ICD-10-CM | POA: Diagnosis not present

## 2020-09-12 DIAGNOSIS — Z20822 Contact with and (suspected) exposure to covid-19: Secondary | ICD-10-CM | POA: Diagnosis present

## 2020-09-12 DIAGNOSIS — Z86718 Personal history of other venous thrombosis and embolism: Secondary | ICD-10-CM

## 2020-09-12 DIAGNOSIS — I953 Hypotension of hemodialysis: Secondary | ICD-10-CM | POA: Diagnosis not present

## 2020-09-12 DIAGNOSIS — K5090 Crohn's disease, unspecified, without complications: Secondary | ICD-10-CM | POA: Diagnosis present

## 2020-09-12 DIAGNOSIS — I10 Essential (primary) hypertension: Secondary | ICD-10-CM | POA: Diagnosis present

## 2020-09-12 DIAGNOSIS — R404 Transient alteration of awareness: Secondary | ICD-10-CM | POA: Diagnosis present

## 2020-09-12 DIAGNOSIS — E1151 Type 2 diabetes mellitus with diabetic peripheral angiopathy without gangrene: Secondary | ICD-10-CM | POA: Diagnosis present

## 2020-09-12 DIAGNOSIS — L732 Hidradenitis suppurativa: Secondary | ICD-10-CM | POA: Diagnosis present

## 2020-09-12 DIAGNOSIS — Y92009 Unspecified place in unspecified non-institutional (private) residence as the place of occurrence of the external cause: Secondary | ICD-10-CM | POA: Diagnosis not present

## 2020-09-12 DIAGNOSIS — G8324 Monoplegia of upper limb affecting left nondominant side: Secondary | ICD-10-CM | POA: Diagnosis present

## 2020-09-12 DIAGNOSIS — Z7982 Long term (current) use of aspirin: Secondary | ICD-10-CM

## 2020-09-12 DIAGNOSIS — N2581 Secondary hyperparathyroidism of renal origin: Secondary | ICD-10-CM | POA: Diagnosis present

## 2020-09-12 DIAGNOSIS — I160 Hypertensive urgency: Secondary | ICD-10-CM | POA: Diagnosis present

## 2020-09-12 DIAGNOSIS — R569 Unspecified convulsions: Secondary | ICD-10-CM

## 2020-09-12 DIAGNOSIS — E1122 Type 2 diabetes mellitus with diabetic chronic kidney disease: Secondary | ICD-10-CM | POA: Diagnosis present

## 2020-09-12 DIAGNOSIS — I132 Hypertensive heart and chronic kidney disease with heart failure and with stage 5 chronic kidney disease, or end stage renal disease: Secondary | ICD-10-CM | POA: Diagnosis present

## 2020-09-12 DIAGNOSIS — E875 Hyperkalemia: Secondary | ICD-10-CM | POA: Diagnosis present

## 2020-09-12 DIAGNOSIS — Z794 Long term (current) use of insulin: Secondary | ICD-10-CM

## 2020-09-12 DIAGNOSIS — R531 Weakness: Secondary | ICD-10-CM

## 2020-09-12 DIAGNOSIS — K50919 Crohn's disease, unspecified, with unspecified complications: Secondary | ICD-10-CM | POA: Diagnosis not present

## 2020-09-12 DIAGNOSIS — E1129 Type 2 diabetes mellitus with other diabetic kidney complication: Secondary | ICD-10-CM

## 2020-09-12 DIAGNOSIS — I872 Venous insufficiency (chronic) (peripheral): Secondary | ICD-10-CM | POA: Diagnosis present

## 2020-09-12 DIAGNOSIS — R4182 Altered mental status, unspecified: Secondary | ICD-10-CM | POA: Diagnosis not present

## 2020-09-12 DIAGNOSIS — Z992 Dependence on renal dialysis: Secondary | ICD-10-CM | POA: Diagnosis not present

## 2020-09-12 DIAGNOSIS — Z91048 Other nonmedicinal substance allergy status: Secondary | ICD-10-CM

## 2020-09-12 DIAGNOSIS — D631 Anemia in chronic kidney disease: Secondary | ICD-10-CM | POA: Diagnosis present

## 2020-09-12 DIAGNOSIS — I679 Cerebrovascular disease, unspecified: Secondary | ICD-10-CM | POA: Insufficient documentation

## 2020-09-12 DIAGNOSIS — Z89512 Acquired absence of left leg below knee: Secondary | ICD-10-CM | POA: Diagnosis not present

## 2020-09-12 DIAGNOSIS — I639 Cerebral infarction, unspecified: Secondary | ICD-10-CM | POA: Insufficient documentation

## 2020-09-12 DIAGNOSIS — N401 Enlarged prostate with lower urinary tract symptoms: Secondary | ICD-10-CM | POA: Diagnosis present

## 2020-09-12 DIAGNOSIS — Z881 Allergy status to other antibiotic agents status: Secondary | ICD-10-CM

## 2020-09-12 DIAGNOSIS — L8990 Pressure ulcer of unspecified site, unspecified stage: Secondary | ICD-10-CM | POA: Diagnosis present

## 2020-09-12 DIAGNOSIS — W182XXA Fall in (into) shower or empty bathtub, initial encounter: Secondary | ICD-10-CM | POA: Diagnosis present

## 2020-09-12 DIAGNOSIS — Z89022 Acquired absence of left finger(s): Secondary | ICD-10-CM

## 2020-09-12 HISTORY — DX: Unspecified place in unspecified non-institutional (private) residence as the place of occurrence of the external cause: Y92.009

## 2020-09-12 HISTORY — DX: Unspecified fall, initial encounter: W19.XXXA

## 2020-09-12 LAB — I-STAT CHEM 8, ED
BUN: 47 mg/dL — ABNORMAL HIGH (ref 6–20)
Calcium, Ion: 1.1 mmol/L — ABNORMAL LOW (ref 1.15–1.40)
Chloride: 102 mmol/L (ref 98–111)
Creatinine, Ser: 9.6 mg/dL — ABNORMAL HIGH (ref 0.61–1.24)
Glucose, Bld: 178 mg/dL — ABNORMAL HIGH (ref 70–99)
HCT: 29 % — ABNORMAL LOW (ref 39.0–52.0)
Hemoglobin: 9.9 g/dL — ABNORMAL LOW (ref 13.0–17.0)
Potassium: 5.7 mmol/L — ABNORMAL HIGH (ref 3.5–5.1)
Sodium: 138 mmol/L (ref 135–145)
TCO2: 25 mmol/L (ref 22–32)

## 2020-09-12 LAB — CBC
HCT: 29.6 % — ABNORMAL LOW (ref 39.0–52.0)
Hemoglobin: 8.8 g/dL — ABNORMAL LOW (ref 13.0–17.0)
MCH: 26.6 pg (ref 26.0–34.0)
MCHC: 29.7 g/dL — ABNORMAL LOW (ref 30.0–36.0)
MCV: 89.4 fL (ref 80.0–100.0)
Platelets: 134 10*3/uL — ABNORMAL LOW (ref 150–400)
RBC: 3.31 MIL/uL — ABNORMAL LOW (ref 4.22–5.81)
RDW: 19.7 % — ABNORMAL HIGH (ref 11.5–15.5)
WBC: 15.7 10*3/uL — ABNORMAL HIGH (ref 4.0–10.5)
nRBC: 0 % (ref 0.0–0.2)

## 2020-09-12 LAB — COMPREHENSIVE METABOLIC PANEL
ALT: 8 U/L (ref 0–44)
AST: 11 U/L — ABNORMAL LOW (ref 15–41)
Albumin: 3 g/dL — ABNORMAL LOW (ref 3.5–5.0)
Alkaline Phosphatase: 73 U/L (ref 38–126)
Anion gap: 13 (ref 5–15)
BUN: 48 mg/dL — ABNORMAL HIGH (ref 6–20)
CO2: 25 mmol/L (ref 22–32)
Calcium: 9.1 mg/dL (ref 8.9–10.3)
Chloride: 98 mmol/L (ref 98–111)
Creatinine, Ser: 9.57 mg/dL — ABNORMAL HIGH (ref 0.61–1.24)
GFR, Estimated: 6 mL/min — ABNORMAL LOW (ref >60)
Glucose, Bld: 187 mg/dL — ABNORMAL HIGH (ref 70–99)
Potassium: 5.5 mmol/L — ABNORMAL HIGH (ref 3.5–5.1)
Sodium: 136 mmol/L (ref 135–145)
Total Bilirubin: 0.6 mg/dL (ref 0.3–1.2)
Total Protein: 7.3 g/dL (ref 6.5–8.1)

## 2020-09-12 LAB — DIFFERENTIAL
Abs Immature Granulocytes: 0.11 10*3/uL — ABNORMAL HIGH (ref 0.00–0.07)
Basophils Absolute: 0 10*3/uL (ref 0.0–0.1)
Basophils Relative: 0 %
Eosinophils Absolute: 0.2 10*3/uL (ref 0.0–0.5)
Eosinophils Relative: 1 %
Immature Granulocytes: 1 %
Lymphocytes Relative: 8 %
Lymphs Abs: 1.2 10*3/uL (ref 0.7–4.0)
Monocytes Absolute: 0.3 10*3/uL (ref 0.1–1.0)
Monocytes Relative: 2 %
Neutro Abs: 13.8 10*3/uL — ABNORMAL HIGH (ref 1.7–7.7)
Neutrophils Relative %: 88 %

## 2020-09-12 LAB — PHOSPHORUS: Phosphorus: 5.4 mg/dL — ABNORMAL HIGH (ref 2.5–4.6)

## 2020-09-12 LAB — PROTIME-INR
INR: 1.1 (ref 0.8–1.2)
Prothrombin Time: 13.7 seconds (ref 11.4–15.2)

## 2020-09-12 LAB — RESP PANEL BY RT-PCR (FLU A&B, COVID) ARPGX2
Influenza A by PCR: NEGATIVE
Influenza B by PCR: NEGATIVE
SARS Coronavirus 2 by RT PCR: NEGATIVE

## 2020-09-12 LAB — MAGNESIUM: Magnesium: 1.9 mg/dL (ref 1.7–2.4)

## 2020-09-12 LAB — ETHANOL: Alcohol, Ethyl (B): <10 mg/dL (ref <10)

## 2020-09-12 LAB — APTT: aPTT: 31 seconds (ref 24–36)

## 2020-09-12 LAB — CBG MONITORING, ED: Glucose-Capillary: 169 mg/dL — ABNORMAL HIGH (ref 70–99)

## 2020-09-12 MED ORDER — SODIUM ZIRCONIUM CYCLOSILICATE 5 G PO PACK
5.0000 g | PACK | Freq: Once | ORAL | Status: AC
  Administered 2020-09-12: 5 g via ORAL
  Filled 2020-09-12: qty 1

## 2020-09-12 MED ORDER — LEVETIRACETAM IN NACL 500 MG/100ML IV SOLN
500.0000 mg | Freq: Once | INTRAVENOUS | Status: DC
  Filled 2020-09-12: qty 100

## 2020-09-12 MED ORDER — LORAZEPAM 2 MG/ML IJ SOLN
1.0000 mg | Freq: Once | INTRAMUSCULAR | Status: AC
  Administered 2020-09-12: 1 mg via INTRAVENOUS
  Filled 2020-09-12: qty 1

## 2020-09-12 MED ORDER — CARVEDILOL 3.125 MG PO TABS: 25.0000 mg | ORAL_TABLET | Freq: Two times a day (BID) | ORAL | Status: DC

## 2020-09-12 MED ORDER — ACETAMINOPHEN 650 MG RE SUPP: 650.0000 mg | RECTAL | Status: DC | PRN

## 2020-09-12 MED ORDER — SEVELAMER CARBONATE 800 MG PO TABS
800.0000 mg | ORAL_TABLET | Freq: Three times a day (TID) | ORAL | Status: DC
  Administered 2020-09-12 – 2020-09-14 (×5): 800 mg via ORAL
  Filled 2020-09-12 (×5): qty 1

## 2020-09-12 MED ORDER — BUPROPION HCL ER (XL) 150 MG PO TB24: 150.0000 mg | ORAL_TABLET | Freq: Every day | ORAL | Status: DC

## 2020-09-12 MED ORDER — GABAPENTIN 300 MG PO CAPS
300.0000 mg | ORAL_CAPSULE | Freq: Two times a day (BID) | ORAL | Status: DC
  Administered 2020-09-12 – 2020-09-13 (×2): 300 mg via ORAL
  Filled 2020-09-12 (×2): qty 1

## 2020-09-12 MED ORDER — SODIUM CHLORIDE 0.9 % IV SOLN
75.0000 mL/h | INTRAVENOUS | Status: DC
  Administered 2020-09-12: 75 mL/h via INTRAVENOUS

## 2020-09-12 MED ORDER — LORAZEPAM 2 MG/ML IJ SOLN: 4.0000 mg | INTRAMUSCULAR | Status: AC

## 2020-09-12 MED ORDER — LEVETIRACETAM IN NACL 1000 MG/100ML IV SOLN
1000.0000 mg | Freq: Once | INTRAVENOUS | Status: AC
  Administered 2020-09-12: 1000 mg via INTRAVENOUS
  Filled 2020-09-12: qty 100

## 2020-09-12 MED ORDER — LEVETIRACETAM IN NACL 500 MG/100ML IV SOLN
500.0000 mg | Freq: Two times a day (BID) | INTRAVENOUS | Status: DC
  Administered 2020-09-12 – 2020-09-13 (×2): 500 mg via INTRAVENOUS
  Filled 2020-09-12 (×2): qty 100

## 2020-09-12 MED ORDER — CHLORHEXIDINE GLUCONATE CLOTH 2 % EX PADS
6.0000 | MEDICATED_PAD | Freq: Every day | CUTANEOUS | Status: DC
  Administered 2020-09-14: 6 via TOPICAL

## 2020-09-12 MED ORDER — LISINOPRIL 10 MG PO TABS: 20.0000 mg | ORAL_TABLET | Freq: Every day | ORAL | Status: DC

## 2020-09-12 MED ORDER — SODIUM ZIRCONIUM CYCLOSILICATE 10 G PO PACK
10.0000 g | PACK | Freq: Once | ORAL | Status: AC
  Administered 2020-09-12: 10 g via ORAL
  Filled 2020-09-12: qty 1

## 2020-09-12 MED ORDER — ACETAMINOPHEN 325 MG PO TABS: 650.0000 mg | ORAL_TABLET | ORAL | Status: DC | PRN

## 2020-09-12 MED ORDER — HEPARIN SODIUM (PORCINE) 5000 UNIT/ML IJ SOLN
5000.0000 [IU] | Freq: Three times a day (TID) | INTRAMUSCULAR | Status: DC
  Administered 2020-09-12 – 2020-09-14 (×4): 5000 [IU] via SUBCUTANEOUS
  Filled 2020-09-12 (×5): qty 1

## 2020-09-12 MED ORDER — ACETAMINOPHEN 160 MG/5ML PO SOLN: 650.0000 mg | ORAL | Status: DC | PRN

## 2020-09-12 MED ORDER — SODIUM ZIRCONIUM CYCLOSILICATE 10 G PO PACK: 10.0000 g | PACK | Freq: Once | ORAL | Status: DC

## 2020-09-12 MED ORDER — AMLODIPINE BESYLATE 5 MG PO TABS: 10.0000 mg | ORAL_TABLET | Freq: Every day | ORAL | Status: DC

## 2020-09-12 MED ORDER — HYDRALAZINE HCL 20 MG/ML IJ SOLN: 10.0000 mg | Freq: Four times a day (QID) | INTRAMUSCULAR | Status: DC | PRN

## 2020-09-12 MED ORDER — ATORVASTATIN CALCIUM 80 MG PO TABS
80.0000 mg | ORAL_TABLET | Freq: Every day | ORAL | Status: DC
  Administered 2020-09-12 – 2020-09-14 (×3): 80 mg via ORAL
  Filled 2020-09-12: qty 1
  Filled 2020-09-12: qty 8
  Filled 2020-09-12: qty 1

## 2020-09-12 MED ORDER — STROKE: EARLY STAGES OF RECOVERY BOOK
Freq: Once | Status: AC
  Filled 2020-09-12: qty 1

## 2020-09-12 MED ORDER — LORAZEPAM 2 MG/ML IJ SOLN
INTRAMUSCULAR | Status: AC
  Administered 2020-09-12: 1 mg via INTRAVENOUS
  Filled 2020-09-12: qty 1

## 2020-09-12 MED ORDER — ASPIRIN 81 MG PO CHEW
81.0000 mg | CHEWABLE_TABLET | Freq: Every day | ORAL | Status: DC
  Administered 2020-09-12 – 2020-09-14 (×3): 81 mg via ORAL
  Filled 2020-09-12 (×3): qty 1

## 2020-09-12 NOTE — H&P (Signed)
History and Physical    [name] PVV:[id number] DOB: 20-Nov-1960 DOA: 09/12/2020  PCP: [name], MD    Patient coming from:  Home   Chief Complaint:  Code stroke.   HPI: [name] is a 59 y.o. male with medical history significant of  DM II, DVT's, H/O cva in 2018 and seen by neurologist Dr. Lorrin Goodell.PT is also a ESRD on HD- dr Hollie Salk. Potassium is 5.5 and was treated in ED.Per wife pt was on way for HD and seemed confused , went to shower and thinks he fell. Pt also had some mumbling. Noted to have tongue biting and urinary incontinence.Pt has h/o seizures and sees neurology and Keppra dose was cut back on previous visit. Per wife earlier today he was Oriented but when I went in room he said is was 31 and reagan was president he knew he was at [facility].    ED Course:  Vitals:   09/12/20 1600 09/12/20 1615 09/12/20 1748 09/12/20 1900  BP: (!) 185/69 (!) 173/66 (!) 124/52 117/89  Pulse: 64 60 70 71  Resp: 13 13 15  (!) 21  Temp:      TempSrc:      SpO2: 100% 100% 96% 98%  Weight:      Height:      In ed pt's initial head ct was negative and MRI was not done due to intolerance. Neuro saw pt and recommend  admit for eval and monitor and repeate head CT.   Review of Systems:  Review of Systems  Unable to perform ROS: Mental status change     Past Medical History:  Diagnosis Date  . Crohn disease ([place])   . Diabetes mellitus without complication ([place])   . DVT of lower extremity (deep venous thrombosis) ([place]) 2016  . Empyema ([place]) 05/20/2017  . Encephalopathy 12/04/2017  . Hidradenitis suppurativa   . ICH (intracerebral hemorrhage) ([place])   . Peritonitis ([place]) 04/21/2017  . Pyogenic arthritis of knee ([place]) 02/04/2016  . Renal disorder   . Sepsis ([place]) 01/12/2018    Past Surgical History:  Procedure Laterality Date  . ABDOMINAL SURGERY    . AMPUTATION FINGER Left 06/2019   [facility]  . ANGIOPLASTY Left    [facility]  [facility] 04-11-2018  . BELOW KNEE LEG AMPUTATION Right 08/2017   [facility]  . DIALYSIS/PERMA CATHETER INSERTION N/A 12/09/2017   Procedure: DIALYSIS/PERMA CATHETER INSERTION;  Surgeon: Katha Cabal, MD;  Location: [place] [facility];  Service: Cardiovascular;  Laterality: N/A;  . DIALYSIS/PERMA CATHETER INSERTION N/A 12/12/2017   Procedure: DIALYSIS/PERMA CATHETER INSERTION;  Surgeon: Algernon Huxley, MD;  Location: [place] [facility];  Service: Cardiovascular;  Laterality: N/A;  . DIALYSIS/PERMA CATHETER REMOVAL Left 12/09/2017   Procedure: DIALYSIS/PERMA CATHETER REMOVAL;  Surgeon: Katha Cabal, MD;  Location: [place] [facility];  Service: Cardiovascular;  Laterality: Left;  . KNEE SURGERY Left 02/04/2016   [facility]  . LEG AMPUTATION THROUGH LOWER TIBIA AND FIBULA Left 06/22/2018   [facility]  . LOWER EXTREMITY ANGIOGRAPHY Right 08/08/2017   Procedure: Lower Extremity Angiography;  Surgeon: Algernon Huxley, MD;  Location: [place] [facility];  Service: Cardiovascular;  Laterality: Right;  . LOWER EXTREMITY ANGIOGRAPHY Right 08/22/2017   Procedure: Lower Extremity Angiography;  Surgeon: Algernon Huxley, MD;  Location: [place] [facility];  Service: Cardiovascular;  Laterality: Right;  . LOWER EXTREMITY INTERVENTION  08/08/2017   Procedure: LOWER EXTREMITY INTERVENTION;  Surgeon: Algernon Huxley,  MD;  Location: [place] [facility];  Service: Cardiovascular;;  . LOWER EXTREMITY INTERVENTION  08/22/2017   Procedure: LOWER EXTREMITY INTERVENTION;  Surgeon: Algernon Huxley, MD;  Location: [place] [facility];  Service: Cardiovascular;;     reports that he has never smoked. He has never used smokeless tobacco. He reports current drug use. Drug: Marijuana. He reports that he does not drink alcohol.  Allergies  Allergen Reactions  . Methotrexate Other (See Comments)    Blood count drops  . Vancomycin Shortness Of Breath    Eyes watering, SOB, wheezing  . Cefepime Other (See Comments)  . Tape     Family History    Problem Relation Age of Onset  . Irritable bowel syndrome Sister   . Diabetes Sister   . Heart disease Mother   . Diabetes Mother   . Heart disease Father   . Rheumatic fever Father        as child  . Psoriasis Brother   . Arthritis Brother   . Diabetes Sister   . Diabetes Sister     Prior to Admission medications   Medication Sig Start Date End Date Taking? Authorizing Provider  acetaminophen (TYLENOL) 500 MG tablet Take 1,000 mg by mouth daily as needed for moderate pain or headache.    Yes [provider]  Adalimumab ([name] PEN) 40 MG/0.4ML PNKT Inject 40 mg into the muscle once a week.  11/27/18  Yes [provider]  amLODipine (NORVASC) 10 MG tablet TAKE 1 TABLET(10 MG) BY MOUTH DAILY AS NEEDED Patient taking differently: Take 10 mg by mouth at bedtime.  04/23/20  Yes [name], MD  atorvastatin (LIPITOR) 80 MG tablet TAKE 1 TABLET(80 MG) BY MOUTH DAILY Patient taking differently: Take 80 mg by mouth daily.  06/25/20  Yes [name], MD  AURYXIA 1 GM 210 MG(Fe) tablet Take 420 mg by mouth in the morning and at bedtime.  10/23/18  Yes [provider]  carvedilol (COREG) 25 MG tablet Take 1 tablet (25 mg total) by mouth 2 (two) times daily. 06/27/20  Yes [name], MD  furosemide (LASIX) 80 MG tablet TAKE 1 TABLET(80 MG) BY MOUTH TWICE DAILY Patient taking differently: Take 80 mg by mouth 2 (two) times daily.  05/02/20  Yes [name], MD  gabapentin (NEURONTIN) 100 MG capsule Take 300 mg by mouth 2 (two) times daily.  08/14/20 11/12/20 Yes [provider]  insulin aspart (NOVOLOG) 100 UNIT/ML FlexPen Inject 3 Units into the skin 3 (three) times daily with meals.   Yes [provider]  insulin glargine (LANTUS) 100 UNIT/ML injection Inject 10 Units into the skin at bedtime.  12/25/18  Yes [provider]  levETIRAcetam (KEPPRA) 250 MG tablet Take 250 mg by mouth at bedtime.  08/14/20 11/12/20 Yes [provider]  lisinopril (ZESTRIL) 20 MG tablet Take 20 mg by mouth daily.  09/08/20 09/08/21 Yes [provider]  sevelamer carbonate (RENVELA) 800 MG tablet Take 1 tablet (800 mg total) by mouth 3 (three) times daily. Patient taking differently: Take 800 mg by mouth in the morning and at bedtime.  12/11/19  Yes [name], MD  Alcohol Swabs PADS Use as directed to check blood sugar three times daily for insulin dependent type 2 diabetes. 10/20/17   [name], MD  Blood Glucose Monitoring Suppl (ONE TOUCH ULTRA 2) w/Device KIT Use as directed to check blood sugar three times daily. E11.9 02/20/18  [name], MD  buPROPion (WELLBUTRIN XL) 150 MG 24 hr tablet Take 1 tablet (150 mg total) by mouth daily. Patient not taking: Reported on 09/12/2020 08/19/20   [name], MD  gabapentin (NEURONTIN) 100 MG capsule Take 1 capsule (100 mg total) by mouth 2 (two) times daily. Patient not taking: Reported on 09/12/2020 01/29/19   [name], MD  levETIRAcetam (KEPPRA) 250 MG tablet Take 250 mg by mouth 2 (two) times daily.  Patient not taking: Reported on 09/12/2020 07/06/19   [provider]  lisinopril (ZESTRIL) 10 MG tablet Take 10 mg by mouth daily.  06/18/20   [provider]  sulfamethoxazole-trimethoprim (BACTRIM) 400-80 MG tablet TAKE 1 TABLET BY MOUTH TWICE DAILY AS NEEDED FOR 10 TO 14 DAYS FOR FLARES Patient not taking: Reported on 06/19/2020 01/02/20   [provider]    Physical Exam: Vitals:   09/12/20 1600 09/12/20 1615 09/12/20 1748 09/12/20 1900  BP: (!) 185/69 (!) 173/66 (!) 124/52 117/89  Pulse: 64 60 70 71  Resp: 13 13 15  (!) 21  Temp:      TempSrc:      SpO2: 100% 100% 96% 98%  Weight:      Height:        Physical Exam Constitutional:      General: He is not in acute distress. HENT:     Head: Normocephalic and atraumatic.     Right Ear: External ear normal.     Left Ear: External ear normal.     Nose: Nose  normal.     Mouth/Throat:     Mouth: Mucous membranes are moist.  Eyes:     Extraocular Movements: Extraocular movements intact.     Pupils: Pupils are equal, round, and reactive to light.  Cardiovascular:     Rate and Rhythm: Normal rate and regular rhythm.     Heart sounds: Normal heart sounds.  Pulmonary:     Effort: Pulmonary effort is normal.     Breath sounds: Normal breath sounds.  Abdominal:     General: Bowel sounds are normal. There is no distension or abdominal bruit.     Palpations: Abdomen is soft. There is no hepatomegaly or mass.     Tenderness: There is no abdominal tenderness.  Musculoskeletal:     Right Lower Extremity: Right leg is amputated below knee.     Left Lower Extremity: Left leg is amputated below knee.  Skin:    General: Skin is warm.  Neurological:     General: No focal deficit present.     Mental Status: He is alert. He is disoriented.     Sensory: No sensory deficit.  Psychiatric:        Mood and Affect: Mood normal.     Labs on Admission: I have personally reviewed following labs and imaging studies   No results for input(s): CKTOTAL, CKMB, TROPONINI in the last 72 hours. Lab Results  Component Value Date   WBC 15.7 (H) 09/12/2020   HGB 9.9 (L) 09/12/2020   HCT 29.0 (L) 09/12/2020   MCV 89.4 09/12/2020   PLT 134 (L) 09/12/2020    Recent Labs  Lab 09/12/20 1115 09/12/20 1115 09/12/20 1119  NA 136   < > 138  K 5.5*   < > 5.7*  CL 98   < > 102  CO2 25  --   --   BUN 48*   < > 47*  CREATININE 9.57*   < > 9.60*  CALCIUM 9.1  --   --   PROT 7.3  --   --   BILITOT 0.6  --   --   ALKPHOS 73  --   --   ALT 8  --   --   AST 11*  --   --   GLUCOSE 187*   < > 178*   < > = values in this interval not displayed.   Lab Results  Component Value Date   CHOL 134 05/27/2020   HDL 62 05/27/2020   LDLCALC 56 05/27/2020   TRIG 82 05/27/2020   No results found for: DDIMER Invalid input(s): POCBNP  Urinalysis    Component Value  Date/Time   COLORURINE YELLOW (A) 12/04/2017 0240   APPEARANCEUR HAZY (A) 12/04/2017 0240   LABSPEC 1.011 12/04/2017 0240   PHURINE 6.0 12/04/2017 0240   GLUCOSEU >=500 (A) 12/04/2017 0240   HGBUR NEGATIVE 12/04/2017 0240   BILIRUBINUR NEGATIVE 12/04/2017 0240   KETONESUR NEGATIVE 12/04/2017 0240   PROTEINUR 100 (A) 12/04/2017 0240   NITRITE NEGATIVE 12/04/2017 0240   LEUKOCYTESUR NEGATIVE 12/04/2017 0240     COVID-19 Labs  No results for input(s): DDIMER, FERRITIN, LDH, CRP in the last 72 hours.  No results found for: [place] on Admission: MR Brain Wo Contrast (neuro protocol)  Result Date: 09/12/2020 CLINICAL DATA:  Stroke follow-up. EXAM: MRI HEAD WITHOUT CONTRAST TECHNIQUE: Multiplanar, multiecho pulse sequences of the brain and surrounding structures were obtained without intravenous contrast. COMPARISON:  Head CT 09/12/2020 FINDINGS: The examination could not be completed due to the patient's condition. Only incomplete axial diffusion imaging was obtained (specifically only the B0 acquisition). This does not allow assessment for an acute infarct. A chronic right MCA infarct is again noted involving the frontal lobe and insula with associated dystrophic calcification. T2 hyperintensities elsewhere in the cerebral white matter bilaterally are incompletely evaluated and nonspecific but may reflect moderate chronic small vessel ischemic disease. There is age advanced cerebral atrophy. There is no significant intracranial mass effect or sizable extra-axial fluid collection. IMPRESSION: 1. Incomplete examination consisting of only a partial DWI acquisition. This does not allow assessment for an acute infarct. 2. Chronic right MCA infarct. Electronically Signed   By: Logan Bores M.D.   On: 09/12/2020 17:46   DG Chest Port 1 View  Result Date: 09/12/2020 CLINICAL DATA:  Dialysis. EXAM: PORTABLE CHEST 1 VIEW COMPARISON:  Chest x-ray 12/04/2017. FINDINGS: Dialysis  catheter stable position. Heart size stable. No pulmonary venous congestion. No focal infiltrate. No pleural effusion or pneumothorax. Old left rib fractures noted. No pneumothorax. IMPRESSION: 1. Dialysis catheter stable position. 2. No acute cardiopulmonary disease. Electronically Signed   By: Marcello Moores  Register   On: 09/12/2020 11:55   CT HEAD CODE STROKE WO CONTRAST  Result Date: 09/12/2020 CLINICAL DATA:  Code stroke. EXAM: CT HEAD WITHOUT CONTRAST TECHNIQUE: Contiguous axial images were obtained from the base of the skull through the vertex without intravenous contrast. COMPARISON:  04/22/2017 FINDINGS: Brain: There is no acute intracranial hemorrhage. Chronic right frontal infarct with involvement of the operculum and adjacent insula. There is associated dystrophic calcification. Patchy and confluent areas of hypoattenuation in the supratentorial white matter are nonspecific but probably reflect moderate to advanced chronic microvascular ischemic changes. Prominence of the ventricles and sulci reflects generalized parenchymal volume loss. These findings have progressed since 2018. There is a small area of lucency within the anterior right thalamus. No extra-axial collection. Vascular: No hyperdense vessel. Intracranial atherosclerotic calcification is  present the skull base. Skull: Unremarkable. Sinuses/Orbits: No acute abnormality. Other: Mastoid air cells are clear. ASPECTS [facility] Stroke Program Early CT Score) - Ganglionic level infarction (caudate, lentiform nuclei, internal capsule, insula, M1-M3 cortex): 7 - Supraganglionic infarction (M4-M6 cortex): 3 Total score (0-10 with 10 being normal): 10, accounting for chronic infarct. IMPRESSION: There is no acute intracranial hemorrhage or evidence of acute infarction. ASPECT score is 10. Age-indeterminate anterior right thalamic infarct. Progression of parenchymal volume loss and chronic microvascular ischemic changes since 2018. These results were  communicated to Dr. Lorrin Goodell At 11:30 am on 09/12/2020 by text page via the [facility] messaging system. Electronically Signed   By: Macy Mis M.D.   On: 09/12/2020 11:44    EKG: Independently reviewed.  SR 61, LVH.    Assessment/Plan Principal Problem:   Fall at home, initial encounter Active Problems:   History of CVA (cerebrovascular accident)   Seizure disorder ([place])   AMS (altered mental status)   Hypertension   Anemia due to chronic kidney disease   Crohn's disease ([place])   Hyperkalemia   Type 2 diabetes mellitus with kidney complication, with long-term current use of insulin (HCC)   End stage renal failure on dialysis ([place])   S/P bilateral BKA (below knee amputation) ([place])   Pressure injury of skin   Fall: -fall precaution. -PT consult. -suspect fall secondary to Syncope/ cva/seizure/ cardiac related ? Dysrhythmia or MI. -Will admit to evaluate further.   AMS/ h/o cva/ seizure d/o: Initial head ct negative.  Repeat ct 24 hours later. Appreciate neurology consult.  Admit with ischemic stroke and seizure protocol.  Keppra iv.PRN lorazepam.  Cont asa, atorvastatin.  Home regimen with amlodipine , coreg and lisinopril held to allow for permissive htn.   HTN; Home regimen with amlodipine , coreg and lisinopril held to allow for permissive htn.  PRN hydralazine if sbp is 190 and above.   Anemia due to ESRD on HD: Follow cbc /type screen and transfuse.   Crohn;s disease: Stable. Previously followed at [place]. On [name] by [facility] Dr. Otho Ket for cutaneous Crohn's disease and hidradenitis suppurativa.   Hyperkalemia: Pt given low kal per nephrology.  HD per nephrology.    DM II; Glycemic protocol . Hold home insulin regimen until pt is on a diet.   ESRD on HD / 2nd hyperparathyroidism: HD per nephrology/ auryxia cont po once diet in place.     DVT prophylaxis:  Heparin  Code Status:  Full code   Family Communication:  None at bedside.    Disposition Plan:  Home   Consults called:  Neurology-Dr.khaliqdina- Per EDMD. Nephrology-Dr.Upton- Per EDMD.  Admission status: Inpatient.   Para Skeans MD [facility] [phone] How to contact the [facility] Attending or Consulting provider [place] or covering provider during after hours [place], for this patient?    1. Check the care team in [facility] and look for a) attending/consulting [facility] provider listed and b) the [facility] team listed 2. Log into [email or web address] and use [place]'s universal password to access. If you do not have the password, please contact the hospital operator. 3. Locate the [facility] provider you are looking for under [facility] and page to a number that you can be directly reached. 4. If you still have difficulty reaching the provider, please page the [facility] (Director on Call) for the Hospitalists listed on amion for assistance. [email or web address] Password [facility] 09/12/2020, 7:41 PM

## 2020-09-12 NOTE — ED Provider Notes (Signed)
[name] EMERGENCY DEPARTMENT Provider Note   CSN: [id number] Arrival date & time: 09/12/20  1108  An emergency department physician performed an initial assessment on this suspected stroke patient at 28.  History Chief Complaint  Patient presents with  . Code Stroke    [name] is a 59 y.o. male with PMHx HTN, hypercholesteremia, Diabetes, ESRD on Dialysis MWF, CVA, Seizure disorder who presents to the ED today via EMS for code stroke. Per EMS they were called out after pt was found laying face down in the bathtub by his wife. She reports that she felt like he was having some slurred speech however when she turned him over onto his back she did not feel like he was slurring his speech. He was noted to have some left arm weakness with EMS however difficult to say if this was from a previous stroke or not. Wife had told EMS that a seizure precipitated pt's previous stroke in 2018. Pt was found to have bitten his tongue and had urinary incontinence. He was scheduled to have dialysis today - has not missed any sessions besides today.   Pt states he believes he slipped after taking a bath today. He vaguely remembers biting his tongue however he is not sure why. The next thing he remembers was his wife crouching over him and stating she was going to call the paramedics. Pt denies any precipitating events prior to him falling. No other complaints per patient at this time.   The history is provided by the patient, the EMS personnel and medical records.       Past Medical History:  Diagnosis Date  . Crohn disease ([place])   . Diabetes mellitus without complication ([place])   . DVT of lower extremity (deep venous thrombosis) ([place]) 2016  . Empyema ([place]) 05/20/2017  . Encephalopathy 12/04/2017  . Hidradenitis suppurativa   . ICH (intracerebral hemorrhage) ([place])   . Peritonitis ([place]) 04/21/2017  . Pyogenic arthritis of knee ([place]) 02/04/2016  . Renal disorder   . Sepsis ([place])  01/12/2018    Patient Active Problem List   Diagnosis Date Noted  . Mild cognitive impairment 09/30/2019  . Anemia 01/05/2019  . Peripheral vascular disease ([place]) 06/28/2018  . Pressure injury of skin 12/04/2017  . S/P bilateral BKA (below knee amputation) ([place]) 10/20/2017  . Atherosclerosis of native arteries of extremity with rest pain ([place]) 08/05/2017  . History of CVA (cerebrovascular accident) 04/15/2017  . Seizure disorder ([place]) 04/15/2017  . End stage renal failure on dialysis ([place]) 04/12/2016  . Aphthae 02/20/2016  . Hidradenitis 02/20/2016  . Leg pain 02/20/2016  . Neuropathy 02/20/2016  . Narrowing of intervertebral disc space 08/29/2015  . Vascular disorder of lower extremity 08/29/2015  . Failure of erection 08/29/2015  . Hypercholesteremia 08/29/2015  . Hypertension 08/29/2015  . Anemia due to chronic kidney disease 05/26/2015  . Venous insufficiency of leg 09/04/2014  . History of deep vein thrombosis (DVT) of lower extremity 08/16/2014  . Prostatic intraepithelial neoplasia 11/02/2013  . Elevated prostate specific antigen (PSA) 09/11/2013  . Benign prostatic hyperplasia with urinary obstruction 08/13/2013  . Spermatocele 08/13/2013  . Avitaminosis D 01/25/2013  . Type 2 diabetes mellitus with kidney complication, with long-term current use of insulin ([place]) 03/28/2012  . Crohn's disease ([place]) 08/03/2011    Past Surgical History:  Procedure Laterality Date  . ABDOMINAL SURGERY    . AMPUTATION FINGER Left 06/2019   [facility]  . ANGIOPLASTY Left  left fem[facility]pop at [facility] 04-11-2018  . BELOW KNEE LEG AMPUTATION Right 08/2017   [facility]  . DIALYSIS/PERMA CATHETER INSERTION N/A 12/09/2017   Procedure: DIALYSIS/PERMA CATHETER INSERTION;  Surgeon: Katha Cabal, MD;  Location: [facility];  Service: Cardiovascular;  Laterality: N/A;  . DIALYSIS/PERMA CATHETER INSERTION N/A 12/12/2017   Procedure: DIALYSIS/PERMA CATHETER INSERTION;   Surgeon: Algernon Huxley, MD;  Location: [facility];  Service: Cardiovascular;  Laterality: N/A;  . DIALYSIS/PERMA CATHETER REMOVAL Left 12/09/2017   Procedure: DIALYSIS/PERMA CATHETER REMOVAL;  Surgeon: Katha Cabal, MD;  Location: [facility];  Service: Cardiovascular;  Laterality: Left;  . KNEE SURGERY Left 02/04/2016   [facility]  . LEG AMPUTATION THROUGH LOWER TIBIA AND FIBULA Left 06/22/2018   [facility]  . LOWER EXTREMITY ANGIOGRAPHY Right 08/08/2017   Procedure: Lower Extremity Angiography;  Surgeon: Algernon Huxley, MD;  Location: [facility];  Service: Cardiovascular;  Laterality: Right;  . LOWER EXTREMITY ANGIOGRAPHY Right 08/22/2017   Procedure: Lower Extremity Angiography;  Surgeon: Algernon Huxley, MD;  Location: [facility];  Service: Cardiovascular;  Laterality: Right;  . LOWER EXTREMITY INTERVENTION  08/08/2017   Procedure: LOWER EXTREMITY INTERVENTION;  Surgeon: Algernon Huxley, MD;  Location: [facility];  Service: Cardiovascular;;  . LOWER EXTREMITY INTERVENTION  08/22/2017   Procedure: LOWER EXTREMITY INTERVENTION;  Surgeon: Algernon Huxley, MD;  Location: [facility];  Service: Cardiovascular;;       Family History  Problem Relation Age of Onset  . Irritable bowel syndrome Sister   . Diabetes Sister   . Heart disease Mother   . Diabetes Mother   . Heart disease Father   . Rheumatic fever Father        as child  . Psoriasis Brother   . Arthritis Brother   . Diabetes Sister   . Diabetes Sister     Social History   Tobacco Use  . Smoking status: Never Smoker  . Smokeless tobacco: Never Used  . Tobacco comment: smokes marijuana  Substance Use Topics  . Alcohol use: No  . Drug use: Yes    Types: Marijuana    Home Medications Prior to Admission medications   Medication Sig Start Date End Date Taking? Authorizing Provider  acetaminophen (TYLENOL) 500 MG tablet Take 1,000 mg by mouth daily as needed for moderate pain or  headache.    Yes [provider]  Adalimumab ([name] PEN) 40 MG/0.4ML PNKT Inject 40 mg into the muscle once a week.  11/27/18  Yes [provider]  amLODipine (NORVASC) 10 MG tablet TAKE 1 TABLET(10 MG) BY MOUTH DAILY AS NEEDED Patient taking differently: Take 10 mg by mouth at bedtime.  04/23/20  Yes [name], MD  atorvastatin (LIPITOR) 80 MG tablet TAKE 1 TABLET(80 MG) BY MOUTH DAILY Patient taking differently: Take 80 mg by mouth daily.  06/25/20  Yes [name], MD  AURYXIA 1 GM 210 MG(Fe) tablet Take 420 mg by mouth in the morning and at bedtime.  10/23/18  Yes [provider]  carvedilol (COREG) 25 MG tablet Take 1 tablet (25 mg total) by mouth 2 (two) times daily. 06/27/20  Yes [name], MD  furosemide (LASIX) 80 MG tablet TAKE 1 TABLET(80 MG) BY MOUTH TWICE DAILY Patient taking differently: Take 80 mg by mouth 2 (two) times daily.  05/02/20  Yes [name], MD  gabapentin (NEURONTIN) 100 MG capsule Take 300 mg  by mouth 2 (two) times daily.  08/14/20 11/12/20 Yes [provider]  insulin aspart (NOVOLOG) 100 UNIT/ML FlexPen Inject 3 Units into the skin 3 (three) times daily with meals.   Yes [provider]  insulin glargine (LANTUS) 100 UNIT/ML injection Inject 10 Units into the skin at bedtime.  12/25/18  Yes [provider]  levETIRAcetam (KEPPRA) 250 MG tablet Take 250 mg by mouth at bedtime.  08/14/20 11/12/20 Yes [provider]  lisinopril (ZESTRIL) 20 MG tablet Take 20 mg by mouth daily.  09/08/20 09/08/21 Yes [provider]  sevelamer carbonate (RENVELA) 800 MG tablet Take 1 tablet (800 mg total) by mouth 3 (three) times daily. Patient taking differently: Take 800 mg by mouth in the morning and at bedtime.  12/11/19  Yes [name], MD  Alcohol Swabs PADS Use as directed to check blood sugar three times daily for insulin dependent type 2 diabetes. 10/20/17   [name], MD  Blood  Glucose Monitoring Suppl (ONE TOUCH ULTRA 2) w/Device KIT Use as directed to check blood sugar three times daily. E11.9 02/20/18   [name], MD  buPROPion (WELLBUTRIN XL) 150 MG 24 hr tablet Take 1 tablet (150 mg total) by mouth daily. Patient not taking: Reported on 09/12/2020 08/19/20   [name], MD  gabapentin (NEURONTIN) 100 MG capsule Take 1 capsule (100 mg total) by mouth 2 (two) times daily. Patient not taking: Reported on 09/12/2020 01/29/19   [name], MD  levETIRAcetam (KEPPRA) 250 MG tablet Take 250 mg by mouth 2 (two) times daily.  Patient not taking: Reported on 09/12/2020 07/06/19   [provider]  lisinopril (ZESTRIL) 10 MG tablet Take 10 mg by mouth daily.  06/18/20   [provider]  sulfamethoxazole-trimethoprim (BACTRIM) 400-80 MG tablet TAKE 1 TABLET BY MOUTH TWICE DAILY AS NEEDED FOR 10 TO 14 DAYS FOR FLARES Patient not taking: Reported on 06/19/2020 01/02/20   [provider]    Allergies    Methotrexate, Vancomycin, Cefepime, and Tape  Review of Systems   Review of Systems  Constitutional: Negative for chills and fever.  Respiratory: Negative for shortness of breath.   Cardiovascular: Negative for chest pain.  Neurological: Positive for seizures and weakness.  All other systems reviewed and are negative.   Physical Exam Updated Vital Signs Pulse 60   Temp 98.9 F (37.2 C) (Oral)   Resp 14   Ht 5' 11"  (1.803 m)   Wt 65.5 kg   SpO2 98%   BMI 20.14 kg/m   Physical Exam Vitals and nursing note reviewed.  Constitutional:      Appearance: He is not ill-appearing or diaphoretic.  HENT:     Head: Normocephalic.     Comments: Small laceration noted to tongue Eyes:     Extraocular Movements: Extraocular movements intact.     Conjunctiva/sclera: Conjunctivae normal.     Pupils: Pupils are equal, round, and reactive to light.  Cardiovascular:     Rate and Rhythm: Normal rate and regular rhythm.     Pulses: Normal  pulses.  Pulmonary:     Effort: Pulmonary effort is normal.     Breath sounds: Normal breath sounds. No wheezing, rhonchi or rales.  Abdominal:     Palpations: Abdomen is soft.     Tenderness: There is no abdominal tenderness. There is no guarding or rebound.  Musculoskeletal:     Cervical back: Neck supple.     Comments: AV fistula  LUE with good thrill  Bilateral BKAs  Skin:    General: Skin is warm and dry.  Neurological:     Mental Status: He is alert.     Comments: Alert and oriented to self, place, time and event.   Speech is fluent, clear without dysarthria or dysphasia.   Strength 4/5 to LUE with mild arm drift. 5/5 in right upper/bilateral lower extremities  Sensation intact in upper/lower extremities   Negative Romberg.  Normal finger-to-nose CN I not tested  CN II grossly intact visual fields bilaterally.  CN III, IV, VI PERRLA and EOMs intact bilaterally  CN V Intact sensation to sharp and light touch to the face  CN VII facial movements symmetric  CN VIII not tested  CN IX, X no uvula deviation, symmetric rise of soft palate  CN XI 5/5 SCM and trapezius strength bilaterally  CN XII Midline tongue protrusion, symmetric L/R movements       ED Results / Procedures / Treatments   Labs (all labs ordered are listed, but only abnormal results are displayed) Labs Reviewed  CBC - Abnormal; Notable for the following components:      Result Value   WBC 15.7 (*)    RBC 3.31 (*)    Hemoglobin 8.8 (*)    HCT 29.6 (*)    MCHC 29.7 (*)    RDW 19.7 (*)    Platelets 134 (*)    All other components within normal limits  DIFFERENTIAL - Abnormal; Notable for the following components:   Neutro Abs 13.8 (*)    Abs Immature Granulocytes 0.11 (*)    All other components within normal limits  COMPREHENSIVE METABOLIC PANEL - Abnormal; Notable for the following components:   Potassium 5.5 (*)    Glucose, Bld 187 (*)    BUN 48 (*)    Creatinine, Ser 9.57 (*)    Albumin 3.0  (*)    AST 11 (*)    GFR, Estimated 6 (*)    All other components within normal limits  I-STAT CHEM 8, ED - Abnormal; Notable for the following components:   Potassium 5.7 (*)    BUN 47 (*)    Creatinine, Ser 9.60 (*)    Glucose, Bld 178 (*)    Calcium, Ion 1.10 (*)    Hemoglobin 9.9 (*)    HCT 29.0 (*)    All other components within normal limits  CBG MONITORING, ED - Abnormal; Notable for the following components:   Glucose-Capillary 169 (*)    All other components within normal limits  RESP PANEL BY RT-PCR (FLU A&B, COVID) ARPGX2  ETHANOL  PROTIME-INR  APTT  RAPID URINE DRUG SCREEN, HOSP PERFORMED  URINALYSIS, ROUTINE W REFLEX MICROSCOPIC  MAGNESIUM  PHOSPHORUS  HEPATITIS B CORE ANTIBODY, TOTAL  HEPATITIS B SURFACE ANTIBODY, QUANTITATIVE  HEPATITIS B SURFACE ANTIGEN    EKG EKG Interpretation  Date/Time:  Friday September 12 2020 11:33:12 EST Ventricular Rate:  61 PR Interval:    QRS Duration: 100 QT Interval:  468 QTC Calculation: 472 R Axis:   52 Text Interpretation: Sinus rhythm Left ventricular hypertrophy since last tracing no significant change Confirmed by Daleen Bo [phone]) on 09/12/2020 11:35:07 AM   Radiology MR Brain Wo Contrast (neuro protocol)  Result Date: 09/12/2020 CLINICAL DATA:  Stroke follow-up. EXAM: MRI HEAD WITHOUT CONTRAST TECHNIQUE: Multiplanar, multiecho pulse sequences of the brain and surrounding structures were obtained without intravenous contrast. COMPARISON:  Head CT 09/12/2020 FINDINGS: The examination could not be completed  due to the patient's condition. Only incomplete axial diffusion imaging was obtained (specifically only the B0 acquisition). This does not allow assessment for an acute infarct. A chronic right MCA infarct is again noted involving the frontal lobe and insula with associated dystrophic calcification. T2 hyperintensities elsewhere in the cerebral white matter bilaterally are incompletely evaluated and nonspecific  but may reflect moderate chronic small vessel ischemic disease. There is age advanced cerebral atrophy. There is no significant intracranial mass effect or sizable extra-axial fluid collection. IMPRESSION: 1. Incomplete examination consisting of only a partial DWI acquisition. This does not allow assessment for an acute infarct. 2. Chronic right MCA infarct. Electronically Signed   By: Logan Bores M.D.   On: 09/12/2020 17:46   DG Chest Port 1 View  Result Date: 09/12/2020 CLINICAL DATA:  Dialysis. EXAM: PORTABLE CHEST 1 VIEW COMPARISON:  Chest x-ray 12/04/2017. FINDINGS: Dialysis catheter stable position. Heart size stable. No pulmonary venous congestion. No focal infiltrate. No pleural effusion or pneumothorax. Old left rib fractures noted. No pneumothorax. IMPRESSION: 1. Dialysis catheter stable position. 2. No acute cardiopulmonary disease. Electronically Signed   By: Marcello Moores  Register   On: 09/12/2020 11:55   CT HEAD CODE STROKE WO CONTRAST  Result Date: 09/12/2020 CLINICAL DATA:  Code stroke. EXAM: CT HEAD WITHOUT CONTRAST TECHNIQUE: Contiguous axial images were obtained from the base of the skull through the vertex without intravenous contrast. COMPARISON:  04/22/2017 FINDINGS: Brain: There is no acute intracranial hemorrhage. Chronic right frontal infarct with involvement of the operculum and adjacent insula. There is associated dystrophic calcification. Patchy and confluent areas of hypoattenuation in the supratentorial white matter are nonspecific but probably reflect moderate to advanced chronic microvascular ischemic changes. Prominence of the ventricles and sulci reflects generalized parenchymal volume loss. These findings have progressed since 2018. There is a small area of lucency within the anterior right thalamus. No extra-axial collection. Vascular: No hyperdense vessel. Intracranial atherosclerotic calcification is present the skull base. Skull: Unremarkable. Sinuses/Orbits: No acute  abnormality. Other: Mastoid air cells are clear. ASPECTS [facility] Stroke Program Early CT Score) - Ganglionic level infarction (caudate, lentiform nuclei, internal capsule, insula, M1-M3 cortex): 7 - Supraganglionic infarction (M4-M6 cortex): 3 Total score (0-10 with 10 being normal): 10, accounting for chronic infarct. IMPRESSION: There is no acute intracranial hemorrhage or evidence of acute infarction. ASPECT score is 10. Age-indeterminate anterior right thalamic infarct. Progression of parenchymal volume loss and chronic microvascular ischemic changes since 2018. These results were communicated to Dr. Lorrin Goodell At 11:30 am on 09/12/2020 by text page via the [facility] messaging system. Electronically Signed   By: Macy Mis M.D.   On: 09/12/2020 11:44    Procedures Procedures (including critical care time)  Medications Ordered in ED Medications  Chlorhexidine Gluconate Cloth 2 % PADS 6 each (has no administration in time range)  sodium zirconium cyclosilicate (LOKELMA) packet 10 g (has no administration in time range)  levETIRAcetam (KEPPRA) IVPB 1000 mg/100 mL premix (0 mg Intravenous Stopped 09/12/20 1231)  sodium zirconium cyclosilicate (LOKELMA) packet 5 g (5 g Oral Not Given 09/12/20 1431)  LORazepam (ATIVAN) injection 1 mg (1 mg Intravenous Given 09/12/20 1620)  LORazepam (ATIVAN) 2 MG/ML injection (1 mg Intravenous Given 09/12/20 1718)    ED Course  I have reviewed the triage vital signs and the nursing notes.  Pertinent labs & imaging results that were available during my care of the patient were reviewed by me and considered in my medical decision making (see chart for details).  MDM Rules/Calculators/A&P                          59 year old male who presents to the ED today initially activated as a code stroke per EMS. He was found lying face down in the bathtub without any water in the tub by his wife and she noted some slurring of speech however the seem to quickly  resolved. He was noted to have some left upper extremity weakness by EMS and therefore code stroke was initiated as last known normal was 9:45 AM this morning. Patient with history of stroke in the past which appeared to be precipitated by seizure. He was noted to have bitten his tongue and had urinary incontinence as well. On arrival to the ED patient was evaluated at the bridge, there was some concern for left facial droop however this was not appreciated by neuro hospitalist. He did have some left upper extremity weakness 4 out of 5 with some left arm drift. He was taken immediately to CT scan and evaluated further. CT scan without any signs of bleed. NIH score of two. Difficult to say if patient's left arm weakness is secondary to a previous stroke and therefore he is not a TPA candidate at this time. There is a strong suspicion for seizure at this time however given his previous stroke was precipitated by seizure and is recommended to get an MRI of his brain. Neuro Hospitalist Dr. Lorrin Goodell recommends Keppra loaded with 1000. If his MRI brain is reassuring there is no further recommendations at this time. He did miss his dialysis appointment today and therefore may require nephrology involvement. Will assess for fluid overload.   CBC with leukocytosis 15.7 and hgb 8.8. Suspect leukocytosis elevated s/2 seizure today. Low suspicion for infection.  CXR clear without signs of fluid overload or infection today CMP with creatinine 9.57 and potassium 5.5; will provide 5 g lokelma at this time as recommended by attending physician Dr. Eulis Foster. Will plan to discuss with nephrology once he is cleared from a neuro aspect  Pt returned from MRI. Nursing staff informed that pt passed his swallow screen however he was handed the lokelma cup and he spilt it on himself. Will reorder. Nephrologist Dr. Joylene Grapes at bed side - saw that pt was here and came to evaluate him. It appears they are very full upstairs in the dialysis  center - spoke about pt being admitted with dialysis likely tomorrow vs being discharged home if stroke work up negative with dialysis later tonight. Will keep him updated.   14:55 PM I spoke with MRI department as I was unable to see the images crossing over. It appears that pt did not tolerate MRI well and he was sent back to the ER. No one including myself or nursing staff were notified that pt did not complete his MRI. Pt will need to go back to MRI and ativan ordered to be given once transport is here. Unfortunately this delayed patients work up significantly.   Pt eventually taken back to MRI. I was called later on stating that he was trying to "crawl" out of the machine and unable to stay still despite 1 mg ativan. I went over to the MRI department myself with nursing staff and provided an additional 1 mg ativan. I personally visualized pt unwilling to stay still for any longer than 1-2 minutes at a time. I attempted to have an honest discussion with him regarding the fact that  we need the MRI in order to determine if he has a stroke however this did not seem to phase him. He was ultimately brought back to his room without successful MRI.   I spoke with Dr. Lorrin Goodell who recommends if pt cannot tolerate MRI that he will need a repeat CT scan in the morning. Will plan to admit at this time with plan for dialysis today or tomorrow while here. Will call nephrology back regarding same.   MRI called - stating they were able to get the axial view of the brain. Have crossed over for radiology to look at - unfortunately not enough information to state whether pt had an acute stroke.   Discussed case with nephrologist Dr. Hollie Salk who will plan for dialysis tomorrow morning.   [facility]ist Dr. Posey Pronto will come evaluate patient for admission.   This note was prepared using Dragon voice recognition software and may include unintentional dictation errors due to the inherent limitations of voice  recognition software.  Final Clinical Impression(s) / ED Diagnoses Final diagnoses:  Seizure ([place])  Left-sided weakness  ESRD (end stage renal disease) [facility])    Rx / DC Orders ED Discharge Orders    None       Eustaquio Maize, PA-C 09/12/20 1844    Daleen Bo, MD 09/14/20 [phone]

## 2020-09-12 NOTE — Progress Notes (Signed)
Pt uncooperative. [name] kept trying to roll over to his side and turning his head from side to side. We used a strap to hold him safely on the table but could not immobilize his head . Tried multiple times to get scan done.  Unable to image at this time.

## 2020-09-12 NOTE — ED Notes (Signed)
Pt's disposition seems to have slightly changed. After Pt vomited, he had a blank look on his face but quickly returned to normal. Pt initially has been very laid back and easy to get along with. Pt is becoming slightly more agitated, stating twice that he wanted to put his pants on and go home. Wife at bedside agreed that Pt has ahd a slight chage in disposition, PA notified, Modified Check was 0

## 2020-09-12 NOTE — ED Notes (Signed)
Patient transported to MRI 

## 2020-09-12 NOTE — Progress Notes (Signed)
MR Tech, Ashlyn, attempted to scan pt. Had to run into room behind tech due to pt wiggling out of scanner and removing anterior head coil. RN and PA came to give more meds to attempt MRI once more. Both remained present while study was re-attempted but pt started once again started to wiggle out of scanner and remove anterior head piece. Scan aborted.

## 2020-09-12 NOTE — ED Provider Notes (Signed)
  Face-to-face evaluation   History: Patient presents for evaluation of a period of altered mental status, possibly seizure which occurred as he was riding himself to go to dialysis today.  His wife helped him, in his wheelchair, to get into the bathroom then left him there to shower as per usual.  A few minutes later she heard a crash, and came into the bathroom, and found him lying over the edge of the tub, with his face inside the tub, which did not have water in it.  He had not yet started the shower.  She states that his voice was muffled because he was lying on his face but when she helped him turn his head he could talk and was alert.  She summoned EMS who transferred him here.  Patient's last dialysis day was 3 days ago.  Physical exam: Alert, cooperative.  No dysarthria.  No aphasia.  Tongue injury left anterior with bruising and abrasion.  He is able lift both arms and legs off the stretcher independently, there is left arm pronator drift.  Medical screening examination/treatment/procedure(s) were conducted as a shared visit with non-physician practitioner(s) and myself.  I personally evaluated the patient during the encounter   Daleen Bo, MD 09/14/20 [phone]

## 2020-09-12 NOTE — ED Triage Notes (Signed)
Pt arrived via EMS from home. Pt found face down iin bath tub by wife at [place]. No water in Tub. Wife thought Pt was slurring but what not slurring speech in EMS arrival. Pt also has left sided weakness. Pt is a dialysis pt and has Hx of CVA.

## 2020-09-12 NOTE — Code Documentation (Signed)
Stroke Response Documentation Code Documentation  [name] is a 59 y.o. male arriving to [facility] ED via [facility] on 09/12/2020 with past medical hx of diabetes, prior right frontal ischemic stroke in 4944 which was complicated by seizures, CKD on dialysis, history of bilateral BKA. Code stroke was activated by EMS. Patient from home where he was LKW at [facility] and now complaining of left sided weakness and slurred speech. Pt was found by his wife lying face down in an empty bath tub. He was noted to have bit his tongue and have loss of bladder control. On aspirin 81 mg daily. Stroke team at the bedside upon patient arrival. Labs drawn and patient cleared for CT by Dr. Tyrone Nine. Patient to CT with team. NIHSS 2, see documentation for details and code stroke times. Patient with left facial droop and left arm weakness on exam. The following imaging was completed: CT. Patient is not a candidate for tPA due to symptoms resolving. Giving 1059m IV loading dose of Keppra now.    Care/Plan: q15 minute VS and q30 minute NIHSS until 1415 Bedside handoff with A. BRonita Hipps RN    SCasimer Bilis Rapid Response RN

## 2020-09-12 NOTE — Consult Note (Signed)
ESRD Consult Note  Requesting provider: Daleen Bo Service requesting consult: ER Reason for consult: ESRD, provision of dialysis Indication for acute dialysis?: End Stage Renal Disease  Outpatient dialysis unit: [facility] schedule: MWF  Assessment/Recommendations: [name] is a/an 59 y.o. male with a past medical history notable for ESRD on HD presenting with weakness concerning for stroke   # ESRD: Dialysis today or tomorrow morning pending nursing availability. 3.5hrs, 2K, 2.5Ca, Na 137, Bicarbonate 35, Dialyzer: NIPRO. Hepatitis and COVID screening # Volume/ hypertension: EDW 60.5kg. Run even with dialysis. Holding BP meds for permissive HTN # Anemia of Chronic Kidney Disease: Hemoglobin 9.9. Unclear home regimen. Hold ESA for now # Secondary Hyperparathyroidism/Hyperphosphatemia: Can continue auryxia once diet ordered. Unclear regimen otherwise. Can obtain records from HD unit if he remains inpatient # Vascular access: LUE AVF # Hospital problem: Weakness concerning for stroke: awaiting MRI results. Dispo pending MRI. Neuro following. Anti-epileptics per nephro #Hyperkalemia: Agree with Lokelma 10g. Will repeat dose in 4 hours.  # Additional recommendations: - Dose all meds for creatinine clearance < 10 ml/min  - Unless absolutely necessary, no MRIs with gadolinium.  - Implement save arm precautions.  Prefer needle sticks in the dorsum of the hands or wrists.  No blood pressure measurements in arm. - If blood transfusion is requested during hemodialysis sessions, please alert [place] prior to the session.   Outpt Orders: MWF, [place], LUE AVF, DFR 600, BFR 400, EDW 60.5kg, NIPRO, 3.5hrs  Recommendations were discussed with the primary team.   History of Present Illness: [name] is a/an 59 y.o. male with a past medical history of ESRD who presents with weakness.  Patient presented to the emergency display after concern for  possible stroke.  Patient was on the way to dialysis with his wife when he became altered.  Was taken home by his wife and was helped into his wheelchair and brought into the bathroom.  Later the patient's wife heard a crash and saw the patient had fallen in the shower and could not get up.  He was face down but was able to talk and was alert.  Unclear if there was seizure activity.  EMS was called and he was brought to the emergency department.  Neurology evaluated patient and did not feel that TPA was indicated.  He has undergone MRI and is awaiting results.  The patient last had dialysis on Tuesday.  He is typically MWF but schedule was adjusted for the holidays.  Today his labs demonstrated potassium of 5.7.  He denies any fevers or chills at this time. He admits to some confusion.  Medications:  Current Facility-Administered Medications  Medication Dose Route Frequency Provider Last Rate Last Admin  . sodium zirconium cyclosilicate (LOKELMA) packet 10 g  10 g Oral Once Eustaquio Maize, PA-C       Current Outpatient Medications  Medication Sig Dispense Refill  . acetaminophen (TYLENOL) 500 MG tablet Take 1,000 mg by mouth daily as needed for moderate pain or headache.     . Adalimumab ([name] PEN) 40 MG/0.4ML PNKT Inject 40 mg into the muscle once a week.     Marland Kitchen amLODipine (NORVASC) 10 MG tablet TAKE 1 TABLET(10 MG) BY MOUTH DAILY AS NEEDED (Patient taking differently: Take 10 mg by mouth at bedtime. ) 90 tablet 1  . atorvastatin (LIPITOR) 80 MG tablet TAKE 1 TABLET(80 MG) BY MOUTH DAILY (Patient taking differently: Take 80 mg by mouth daily. ) 90 tablet  3  . AURYXIA 1 GM 210 MG(Fe) tablet Take 420 mg by mouth in the morning and at bedtime.     . carvedilol (COREG) 25 MG tablet Take 1 tablet (25 mg total) by mouth 2 (two) times daily. 60 tablet 12  . furosemide (LASIX) 80 MG tablet TAKE 1 TABLET(80 MG) BY MOUTH TWICE DAILY (Patient taking differently: Take 80 mg by mouth 2 (two) times daily. )  180 tablet 1  . gabapentin (NEURONTIN) 100 MG capsule Take 300 mg by mouth 2 (two) times daily.     . insulin aspart (NOVOLOG) 100 UNIT/ML FlexPen Inject 3 Units into the skin 3 (three) times daily with meals.    . insulin glargine (LANTUS) 100 UNIT/ML injection Inject 10 Units into the skin at bedtime.     . levETIRAcetam (KEPPRA) 250 MG tablet Take 250 mg by mouth at bedtime.     Marland Kitchen lisinopril (ZESTRIL) 20 MG tablet Take 20 mg by mouth daily.     . sevelamer carbonate (RENVELA) 800 MG tablet Take 1 tablet (800 mg total) by mouth 3 (three) times daily. (Patient taking differently: Take 800 mg by mouth in the morning and at bedtime. ) 270 tablet 3  . Alcohol Swabs PADS Use as directed to check blood sugar three times daily for insulin dependent type 2 diabetes. 100 each 12  . Blood Glucose Monitoring Suppl (ONE TOUCH ULTRA 2) w/Device KIT Use as directed to check blood sugar three times daily. E11.9 1 each 0  . buPROPion (WELLBUTRIN XL) 150 MG 24 hr tablet Take 1 tablet (150 mg total) by mouth daily. (Patient not taking: Reported on 09/12/2020) 90 tablet 0  . gabapentin (NEURONTIN) 100 MG capsule Take 1 capsule (100 mg total) by mouth 2 (two) times daily. (Patient not taking: Reported on 09/12/2020) 180 capsule 2  . levETIRAcetam (KEPPRA) 250 MG tablet Take 250 mg by mouth 2 (two) times daily.  (Patient not taking: Reported on 09/12/2020)    . lisinopril (ZESTRIL) 10 MG tablet Take 10 mg by mouth daily.     Marland Kitchen sulfamethoxazole-trimethoprim (BACTRIM) 400-80 MG tablet TAKE 1 TABLET BY MOUTH TWICE DAILY AS NEEDED FOR 10 TO 14 DAYS FOR FLARES (Patient not taking: Reported on 06/19/2020)       ALLERGIES Methotrexate, Vancomycin, Cefepime, and Tape  MEDICAL HISTORY Past Medical History:  Diagnosis Date  . Crohn disease ([place])   . Diabetes mellitus without complication ([place])   . DVT of lower extremity (deep venous thrombosis) ([place]) 2016  . Empyema ([place]) 05/20/2017  . Encephalopathy 12/04/2017  .  Hidradenitis suppurativa   . ICH (intracerebral hemorrhage) ([place])   . Peritonitis ([place]) 04/21/2017  . Pyogenic arthritis of knee ([place]) 02/04/2016  . Renal disorder   . Sepsis ([place]) 01/12/2018     SOCIAL HISTORY Social History   Socioeconomic History  . Marital status: Married    Spouse name: Not on file  . Number of children: 2  . Years of education: Not on file  . Highest education level: Associate degree: occupational, Hotel manager, or vocational program  Occupational History  . Occupation: disable  Tobacco Use  . Smoking status: Never Smoker  . Smokeless tobacco: Never Used  . Tobacco comment: smokes marijuana  Substance and Sexual Activity  . Alcohol use: No  . Drug use: Yes    Types: Marijuana  . Sexual activity: Not Currently  Other Topics Concern  . Not on file  Social History Narrative  . Not on file  Social Determinants of Health   Financial Resource Strain: Low Risk   . Difficulty of Paying Living Expenses: Not hard at all  Food Insecurity: No Food Insecurity  . Worried About Charity fundraiser in the Last Year: Never true  . Ran Out of Food in the Last Year: Never true  Transportation Needs: No Transportation Needs  . Lack of Transportation (Medical): No  . Lack of Transportation (Non-Medical): No  Physical Activity: Inactive  . Days of Exercise per Week: 0 days  . Minutes of Exercise per Session: 0 min  Stress: No Stress Concern Present  . Feeling of Stress : Only a little  Social Connections: Moderately Isolated  . Frequency of Communication with Friends and Family: Twice a week  . Frequency of Social Gatherings with Friends and Family: Three times a week  . Attends Religious Services: Never  . Active Member of Clubs or Organizations: No  . Attends Archivist Meetings: Never  . Marital Status: Married  Human resources officer Violence: Not At Risk  . Fear of Current or Ex-Partner: No  . Emotionally Abused: No  . Physically Abused: No  . Sexually  Abused: No     FAMILY HISTORY Family History  Problem Relation Age of Onset  . Irritable bowel syndrome Sister   . Diabetes Sister   . Heart disease Mother   . Diabetes Mother   . Heart disease Father   . Rheumatic fever Father        as child  . Psoriasis Brother   . Arthritis Brother   . Diabetes Sister   . Diabetes Sister      Review of Systems: 12 systems were reviewed and negative except per HPI  Physical Exam: Vitals:   09/12/20 1245 09/12/20 1415  BP: (!) 176/134 (!) 186/82  Pulse:  63  Resp: 11 (!) 25  Temp:    SpO2:  100%   Total I/O In: 100 [IV Piggyback:100] Out: -   Intake/Output Summary (Last 24 hours) at 09/12/2020 1454 Last data filed at 09/12/2020 1231 Gross per 24 hour  Intake 100 ml  Output --  Net 100 ml   General: well-appearing, no acute distress HEENT: anicteric sclera, MMM CV: normal rate, no murmurs, no edema Lungs: bilateral chest rise, normal wob Abd: soft, non-tender, non-distended Skin: no visible lesions or rashes Psych: alert, engaged, appropriate mood and affect Neuro: left mouth drop, weak left grip, 3/5 left arm strength, mild confusion but answers most questions Access: LUE AVF w/ bruit and thrill  Test Results Reviewed Lab Results  Component Value Date   NA 138 09/12/2020   K 5.7 (H) 09/12/2020   CL 102 09/12/2020   CO2 25 09/12/2020   BUN 47 (H) 09/12/2020   CREATININE 9.60 (H) 09/12/2020   CALCIUM 9.1 09/12/2020   ALBUMIN 3.0 (L) 09/12/2020   PHOS 10.5 (H) 04/25/2017    I have reviewed relevant outside healthcare records

## 2020-09-12 NOTE — ED Notes (Signed)
MRI called for pt pick up

## 2020-09-12 NOTE — Consult Note (Signed)
NEUROLOGY CONSULTATION NOTE   Date of service: September 12, 2020 Patient Name: [name] MRN:  [id number] DOB:  December 29, 1960 Reason for consult: "stroke code"  History of Present Illness  [name] is a 59 y.o. male with PMH significant for diabetes, history of DVTs, prior right frontal ischemic stroke in 4098 which was complicated by seizures, CKD on dialysis, history of bilateral below-knee amputations who presents today as a stroke code with left-sided weakness per EMS.  History was obtained from patient and discussion with his wife over the phone.  Patient reports that he got up in the morning and was getting ready to get to his dialysis.  His wife endorsed says that he seemed a little bit more confused when he woke up today.  He is usually able to transfer to his wheelchair from the bed by himself but today he was trying to turn over his wheelchair around which is very unusual for him.  He was able to transfer himself to the wheelchair and get to the shower.  She thinks that he slipped in the shower and fell.  Patient endorses that he in fact did slip.  Wife noted the patient was mumbling but at the same time he had fallen face first and his head and face was twisted.  She reports that when he she helped him turn around, he was no longer mumbling.  Patient endorses that he has somewhat poor recollection of the actual event but his tongue is sore and he did lost control of his bladder.  On evaluation of his mild, he appears to have had a left lateral tongue bite.  Patient's wife reports that there have been issues with his memory over the last few months and he seems to be more more forgetful.  She states that he is scheduled for an inpatient neurology visit at the end of this month.  He takes aspirin at home daily.  Wife reports that about 3 weeks ago, his Keppra dose was cut down to 250 mg daily with an additional dose on dialysis days.  Prior to that, he was on 250 mg twice daily.  LKW:  0945 NIHSS: 1 for mild LUE drift MRS: 2 TPA: no, too mild to treat, prior hx of ICH Thrombectomy: No, too mild to treat.   ROS   Constitutional Denies weight loss, fever and chills.   HEENT Denies changes in vision and hearing.   Respiratory Denies SOB and cough.   CV Denies palpitations and CP   GI Denies abdominal pain, nausea, vomiting and diarrhea.   GU Denies dysuria and urinary frequency.   MSK Denies myalgia and joint pain.   Skin Denies rash and pruritus.   Neurological Denies headache and syncope.   Psychiatric Denies recent changes in mood. Denies anxiety and depression.    Past History   Past Medical History:  Diagnosis Date  . Crohn disease ([place])   . Diabetes mellitus without complication ([place])   . DVT of lower extremity (deep venous thrombosis) ([place]) 2016  . Empyema ([place]) 05/20/2017  . Encephalopathy 12/04/2017  . Hidradenitis suppurativa   . ICH (intracerebral hemorrhage) ([place])   . Peritonitis ([place]) 04/21/2017  . Pyogenic arthritis of knee ([place]) 02/04/2016  . Renal disorder   . Sepsis ([place]) 01/12/2018   Past Surgical History:  Procedure Laterality Date  . ABDOMINAL SURGERY    . AMPUTATION FINGER Left 06/2019   [facility]  . ANGIOPLASTY Left    [facility]11-2018  . BELOW KNEE LEG AMPUTATION Right 08/2017   [facility]  . DIALYSIS/PERMA CATHETER INSERTION N/A 12/09/2017   Procedure: DIALYSIS/PERMA CATHETER INSERTION;  Surgeon: Katha Cabal, MD;  Location: [facility];  Service: Cardiovascular;  Laterality: N/A;  . DIALYSIS/PERMA CATHETER INSERTION N/A 12/12/2017   Procedure: DIALYSIS/PERMA CATHETER INSERTION;  Surgeon: Algernon Huxley, MD;  Location: [facility];  Service: Cardiovascular;  Laterality: N/A;  . DIALYSIS/PERMA CATHETER REMOVAL Left 12/09/2017   Procedure: DIALYSIS/PERMA CATHETER REMOVAL;  Surgeon: Katha Cabal, MD;  Location: [facility];  Service: Cardiovascular;  Laterality: Left;   . KNEE SURGERY Left 02/04/2016   [facility]  . LEG AMPUTATION THROUGH LOWER TIBIA AND FIBULA Left 06/22/2018   [facility]  . LOWER EXTREMITY ANGIOGRAPHY Right 08/08/2017   Procedure: Lower Extremity Angiography;  Surgeon: Algernon Huxley, MD;  Location: [place] [facility];  Service: Cardiovascular;  Laterality: Right;  . LOWER EXTREMITY ANGIOGRAPHY Right 08/22/2017   Procedure: Lower Extremity Angiography;  Surgeon: Algernon Huxley, MD;  Location: [place] [facility] LAB;  Service: Cardiovascular;  Laterality: Right;  . LOWER EXTREMITY INTERVENTION  08/08/2017   Procedure: LOWER EXTREMITY INTERVENTION;  Surgeon: Algernon Huxley, MD;  Location: [facility];  Service: Cardiovascular;;  . LOWER EXTREMITY INTERVENTION  08/22/2017   Procedure: LOWER EXTREMITY INTERVENTION;  Surgeon: Algernon Huxley, MD;  Location: [place] [facility];  Service: Cardiovascular;;   Family History  Problem Relation Age of Onset  . Irritable bowel syndrome Sister   . Diabetes Sister   . Heart disease Mother   . Diabetes Mother   . Heart disease Father   . Rheumatic fever Father        as child  . Psoriasis Brother   . Arthritis Brother   . Diabetes Sister   . Diabetes Sister    Social History   Socioeconomic History  . Marital status: Married    Spouse name: Not on file  . Number of children: 2  . Years of education: Not on file  . Highest education level: Associate degree: occupational, Hotel manager, or vocational program  Occupational History  . Occupation: disable  Tobacco Use  . Smoking status: Never Smoker  . Smokeless tobacco: Never Used  . Tobacco comment: smokes marijuana  Substance and Sexual Activity  . Alcohol use: No  . Drug use: Yes    Types: Marijuana  . Sexual activity: Not Currently  Other Topics Concern  . Not on file  Social History Narrative  . Not on file   Social Determinants of Health   Financial Resource Strain: Low Risk   . Difficulty of Paying Living Expenses: Not hard at all  Food  Insecurity: No Food Insecurity  . Worried About Charity fundraiser in the Last Year: Never true  . Ran Out of Food in the Last Year: Never true  Transportation Needs: No Transportation Needs  . Lack of Transportation (Medical): No  . Lack of Transportation (Non-Medical): No  Physical Activity: Inactive  . Days of Exercise per Week: 0 days  . Minutes of Exercise per Session: 0 min  Stress: No Stress Concern Present  . Feeling of Stress : Only a little  Social Connections: Moderately Isolated  . Frequency of Communication with Friends and Family: Twice a week  . Frequency of Social Gatherings with Friends and Family: Three times a week  . Attends Religious Services: Never  . Active Member of Clubs or  Organizations: No  . Attends Archivist Meetings: Never  . Marital Status: Married   Allergies  Allergen Reactions  . Methotrexate Other (See Comments)    Blood count drops  . Vancomycin Shortness Of Breath    Eyes watering, SOB, wheezing  . Cefepime Other (See Comments)  . Tape     Medications  (Not in a hospital admission)    Vitals   Vitals:   09/12/20 1100  Weight: 65.5 kg  Height: 5' 11"  (1.803 m)     Body mass index is 20.14 kg/m.  Physical Exam   General: Laying comfortably in bed; in no acute distress.  HENT: Normal oropharynx and mucosa. Normal external appearance of ears and nose.  Neck: Supple, no pain or tenderness  CV: No JVD. No peripheral edema.  Pulmonary: Symmetric Chest rise. Normal respiratory effort.  Abdomen: Soft to touch, non-tender.  Ext: No cyanosis, edema, or deformity  Skin: No rash. Normal palpation of skin.   Musculoskeletal: Normal digits and nails by inspection. No clubbing.   Neurologic Examination  Mental status/Cognition: Alert, oriented to self, place, month and year, good attention.  Speech/language: Fluent, comprehension intact, object naming intact, repetition intact. Cranial nerves:   CN II Pupils equal and  reactive to light, no VF deficits   CN III,IV,VI EOM intact, no gaze preference or deviation, no nystagmus   CN V normal sensation in V1, V2, and V3 segments bilaterally   CN VII no asymmetry, no nasolabial fold flattening   CN VIII normal hearing to speech   CN IX & X normal palatal elevation, no uvular deviation   CN XI 5/5 head turn and 5/5 shoulder shrug bilaterally   CN XII midline tongue protrusion   Motor:  Muscle bulk: poor, tone normal, pronator drift Yes, LUE tremor no Mvmt Root Nerve  Muscle Right Left Comments  SA C5/6 Ax Deltoid 5 4+   EF C5/6 Mc Biceps 5 5   EE C6/7/8 Rad Triceps 5 4+   WF C6/7 Med FCR 5 5   WE C7/8 PIN ECU 5 5   F Ab C8/T1 U ADM/FDI 5 5   HF L1/2/3 Fem Illopsoas 5 5   KE L2/3/4 Fem Quad - -   DF L4/5 D Peron Tib Ant - -   PF S1/2 Tibial Grc/Sol - -    Reflexes:  Right Left Comments  Pectoralis      Biceps (C5/6) 1 1   Brachioradialis (C5/6) 1 1    Triceps (C6/7) 1 1    Patellar (L3/4) 1 1    Achilles (S1)      Hoffman      Plantar     Jaw jerk    Sensation:  Light touch Intact throughout   Pin prick    Temperature    Vibration   Proprioception    Coordination/Complex Motor:  - Finger to Nose intact BL - Heel to shin unable to perform 2/2 BL BKA.  Labs   CBC:  Recent Labs  Lab 09/12/20 1115 09/12/20 1119  WBC 15.7*  --   NEUTROABS 13.8*  --   HGB 8.8* 9.9*  HCT 29.6* 29.0*  MCV 89.4  --   PLT 134*  --     Basic Metabolic Panel:  Lab Results  Component Value Date   NA 138 09/12/2020   K 5.7 (H) 09/12/2020   CO2 26 05/27/2020   GLUCOSE 178 (H) 09/12/2020   BUN 47 (H) 09/12/2020   CREATININE 9.60 (  H) 09/12/2020   CALCIUM 9.7 05/27/2020   GFRNONAA 12 (L) 05/27/2020   GFRAA 14 (L) 05/27/2020   Lipid Panel:  Lab Results  Component Value Date   LDLCALC 56 05/27/2020   HgbA1c:  Lab Results  Component Value Date   HGBA1C 5.5 05/27/2020   Urine Drug Screen:     Component Value Date/Time   LABOPIA NONE DETECTED  04/13/2017 1455   COCAINSCRNUR NONE DETECTED 04/13/2017 1455   LABBENZ NONE DETECTED 04/13/2017 1455   AMPHETMU NONE DETECTED 04/13/2017 1455   THCU POSITIVE (A) 04/13/2017 1455   LABBARB NONE DETECTED 04/13/2017 1455    Alcohol Level     Component Value Date/Time   ETH <5 04/13/2017 1350     Results for orders placed during the hospital encounter of 04/21/17  CT HEAD WO CONTRAST  Narrative CLINICAL DATA:  Increasing confusion over the past 24 hours. Abnormal head CT with a likely right frontal infarct.  EXAM: CT HEAD WITHOUT CONTRAST  TECHNIQUE: Contiguous axial images were obtained from the base of the skull through the vertex without intravenous contrast.  COMPARISON:  Head CT scan 04/21/2017, 04/13/2017 and 11/06/2015.  FINDINGS: Brain: Hypoattenuation the right frontal lobe is again seen. Scattered areas of peripheral hyperattenuation about the lesion are again seen and not notably changed since the most recent examination and likely represent a small amount of associated hemorrhage. No subdural hemorrhage is identified. No midline shift or hydrocephalus.  Vascular: Age advanced atherosclerosis of the carotid siphons is noted.  Skull: Negative.  Sinuses/Orbits: Negative.  Other: None.  IMPRESSION: No change in the appearance of hypoattenuation in the right frontal lobe likely due to infarct with an associated small amount of hemorrhage. MRI with and without contrast could be used for confirmation. No new abnormality compared to the most recent exam.   Impression   [name] is a 59 y.o. male with PMH significant for diabetes, history of DVTs, prior right frontal ischemic stroke in 2703 which was complicated by seizures, CKD on dialysis, history of bilateral below-knee amputations who presents today as a stroke code with left-sided weakness per EMS.  His presentation especially with a tongue bite, loss of bladder control and resolving weakness is  more consistent with a seizure than a stroke.  I suspect that decreasing his Keppra dose about 3 weeks ago probably contributed to this. However, given his prior right MCA stroke presented as a seizure in 2018, we will get MRI brain without contrast just to make sure there is no new stroke.  Recommendations  -I ordered Keppra load of 1 g IV once. -I recommend increasing his Keppra to 500 mg daily and that he should get his daily dose of Keppra on days of dialysis right after his dialysis. -I recommend MRI brain without contrast.  If the MRI brain does not show a new stroke, I would not recommend further work-up. -Wife concerned about patient missing his dialysis session today.  I did discuss this with the ED providers and they are considering getting nephrology on board. -No driving for 6 months until he is seizure-free. -Follow-up with his neurologist outpatient. ______________________________________________________________________   Thank you for the opportunity to take part in the care of this patient. If you have any further questions, please contact the neurology consultation attending.  Signed,  [place] Pager Number [phone]

## 2020-09-12 NOTE — Progress Notes (Signed)
Pt came for MRI around 1630 and had ativan prior. He was unable to hold still and follow commands. RN was called and came to give more ativan, but pt was still moving around and pulling at the head coil and trying to crawl out of the machine.

## 2020-09-12 NOTE — ED Notes (Signed)
Pt vomited after changing him. He stated that he was slightly nasueaed before we changed hm and moving him made it worse, md notified

## 2020-09-12 NOTE — ED Notes (Signed)
Off floor to MRI

## 2020-09-12 NOTE — ED Notes (Signed)
Patient attempted to urinate however was unsuccessful wife states that he typically only makes urine once a day and he urinated this morning when he woke up. Sometimes he urinates a second time but that is only after dialysis and it is not much.

## 2020-09-12 NOTE — ED Notes (Signed)
Back from MRI.

## 2020-09-13 ENCOUNTER — Inpatient Hospital Stay (HOSPITAL_COMMUNITY): Payer: Medicare Other

## 2020-09-13 DIAGNOSIS — R4182 Altered mental status, unspecified: Secondary | ICD-10-CM | POA: Diagnosis not present

## 2020-09-13 DIAGNOSIS — I6389 Other cerebral infarction: Secondary | ICD-10-CM | POA: Diagnosis not present

## 2020-09-13 DIAGNOSIS — K50919 Crohn's disease, unspecified, with unspecified complications: Secondary | ICD-10-CM | POA: Diagnosis not present

## 2020-09-13 DIAGNOSIS — E1122 Type 2 diabetes mellitus with diabetic chronic kidney disease: Secondary | ICD-10-CM

## 2020-09-13 DIAGNOSIS — N186 End stage renal disease: Secondary | ICD-10-CM | POA: Diagnosis not present

## 2020-09-13 DIAGNOSIS — W19XXXA Unspecified fall, initial encounter: Secondary | ICD-10-CM | POA: Diagnosis not present

## 2020-09-13 DIAGNOSIS — Z794 Long term (current) use of insulin: Secondary | ICD-10-CM

## 2020-09-13 DIAGNOSIS — D631 Anemia in chronic kidney disease: Secondary | ICD-10-CM

## 2020-09-13 DIAGNOSIS — Z992 Dependence on renal dialysis: Secondary | ICD-10-CM

## 2020-09-13 DIAGNOSIS — Z8673 Personal history of transient ischemic attack (TIA), and cerebral infarction without residual deficits: Secondary | ICD-10-CM | POA: Diagnosis not present

## 2020-09-13 DIAGNOSIS — Y92009 Unspecified place in unspecified non-institutional (private) residence as the place of occurrence of the external cause: Secondary | ICD-10-CM

## 2020-09-13 DIAGNOSIS — E875 Hyperkalemia: Secondary | ICD-10-CM

## 2020-09-13 DIAGNOSIS — I10 Essential (primary) hypertension: Secondary | ICD-10-CM

## 2020-09-13 DIAGNOSIS — R569 Unspecified convulsions: Secondary | ICD-10-CM | POA: Diagnosis not present

## 2020-09-13 LAB — GLUCOSE, CAPILLARY
Glucose-Capillary: 103 mg/dL — ABNORMAL HIGH (ref 70–99)
Glucose-Capillary: 86 mg/dL (ref 70–99)
Glucose-Capillary: 156 mg/dL — ABNORMAL HIGH (ref 70–99)
Glucose-Capillary: 92 mg/dL (ref 70–99)

## 2020-09-13 LAB — ECHOCARDIOGRAM COMPLETE
Area-P 1/2: 3.63 cm2
Height: 71 in
S' Lateral: 3.7 cm
Single Plane A4C EF: 46.1 %
Weight: 2310.42 oz

## 2020-09-13 LAB — CBC
HCT: 26.3 % — ABNORMAL LOW (ref 39.0–52.0)
Hemoglobin: 8.1 g/dL — ABNORMAL LOW (ref 13.0–17.0)
MCH: 26.7 pg (ref 26.0–34.0)
MCHC: 30.8 g/dL (ref 30.0–36.0)
MCV: 86.8 fL (ref 80.0–100.0)
Platelets: 127 10*3/uL — ABNORMAL LOW (ref 150–400)
RBC: 3.03 MIL/uL — ABNORMAL LOW (ref 4.22–5.81)
RDW: 19.6 % — ABNORMAL HIGH (ref 11.5–15.5)
WBC: 10.2 10*3/uL (ref 4.0–10.5)
nRBC: 0 % (ref 0.0–0.2)

## 2020-09-13 LAB — HIV ANTIBODY (ROUTINE TESTING W REFLEX): HIV Screen 4th Generation wRfx: NONREACTIVE

## 2020-09-13 LAB — RENAL FUNCTION PANEL
Albumin: 2.9 g/dL — ABNORMAL LOW (ref 3.5–5.0)
Anion gap: 16 — ABNORMAL HIGH (ref 5–15)
BUN: 58 mg/dL — ABNORMAL HIGH (ref 6–20)
CO2: 23 mmol/L (ref 22–32)
Calcium: 8.9 mg/dL (ref 8.9–10.3)
Chloride: 99 mmol/L (ref 98–111)
Creatinine, Ser: 10.15 mg/dL — ABNORMAL HIGH (ref 0.61–1.24)
GFR, Estimated: 5 mL/min — ABNORMAL LOW (ref >60)
Glucose, Bld: 110 mg/dL — ABNORMAL HIGH (ref 70–99)
Phosphorus: 5.8 mg/dL — ABNORMAL HIGH (ref 2.5–4.6)
Potassium: 5.2 mmol/L — ABNORMAL HIGH (ref 3.5–5.1)
Sodium: 138 mmol/L (ref 135–145)

## 2020-09-13 LAB — LIPID PANEL
Cholesterol: 109 mg/dL (ref 0–200)
HDL: 40 mg/dL — ABNORMAL LOW (ref >40)
LDL Cholesterol: 51 mg/dL (ref 0–99)
Total CHOL/HDL Ratio: 2.7 RATIO
Triglycerides: 90 mg/dL (ref <150)
VLDL: 18 mg/dL (ref 0–40)

## 2020-09-13 LAB — HEMOGLOBIN A1C
Hgb A1c MFr Bld: 6.7 % — ABNORMAL HIGH (ref 4.8–5.6)
Mean Plasma Glucose: 145.59 mg/dL

## 2020-09-13 LAB — HEPATITIS B SURFACE ANTIGEN: Hepatitis B Surface Ag: NONREACTIVE

## 2020-09-13 MED ORDER — CARVEDILOL 6.25 MG PO TABS
6.2500 mg | ORAL_TABLET | Freq: Two times a day (BID) | ORAL | Status: DC
  Administered 2020-09-13 (×2): 6.25 mg via ORAL
  Filled 2020-09-13 (×2): qty 1

## 2020-09-13 MED ORDER — SODIUM CHLORIDE 0.9 % IV SOLN: 100.0000 mL | INTRAVENOUS | Status: DC | PRN

## 2020-09-13 MED ORDER — AMLODIPINE BESYLATE 10 MG PO TABS
10.0000 mg | ORAL_TABLET | Freq: Every day | ORAL | Status: DC
  Administered 2020-09-13: 10 mg via ORAL
  Filled 2020-09-13: qty 1

## 2020-09-13 MED ORDER — HEPARIN SODIUM (PORCINE) 1000 UNIT/ML DIALYSIS: 1000.0000 [IU] | INTRAMUSCULAR | Status: DC | PRN

## 2020-09-13 MED ORDER — INSULIN ASPART 100 UNIT/ML ~~LOC~~ SOLN: 0.0000 [IU] | Freq: Every day | SUBCUTANEOUS | Status: DC

## 2020-09-13 MED ORDER — FERRIC CITRATE 1 GM 210 MG(FE) PO TABS
420.0000 mg | ORAL_TABLET | Freq: Three times a day (TID) | ORAL | Status: DC
  Administered 2020-09-13 – 2020-09-14 (×3): 420 mg via ORAL
  Filled 2020-09-13 (×4): qty 2

## 2020-09-13 MED ORDER — INSULIN ASPART 100 UNIT/ML ~~LOC~~ SOLN: 0.0000 [IU] | Freq: Three times a day (TID) | SUBCUTANEOUS | Status: DC

## 2020-09-13 MED ORDER — INSULIN ASPART 100 UNIT/ML ~~LOC~~ SOLN
0.0000 [IU] | Freq: Three times a day (TID) | SUBCUTANEOUS | Status: DC
  Administered 2020-09-13: 1 [IU] via SUBCUTANEOUS

## 2020-09-13 MED ORDER — GABAPENTIN 300 MG PO CAPS
300.0000 mg | ORAL_CAPSULE | Freq: Every day | ORAL | Status: DC
  Filled 2020-09-13: qty 1

## 2020-09-13 MED ORDER — LIDOCAINE HCL (PF) 1 % IJ SOLN: 5.0000 mL | INTRAMUSCULAR | Status: DC | PRN

## 2020-09-13 MED ORDER — PENTAFLUOROPROP-TETRAFLUOROETH EX AERO: 1.0000 "application " | INHALATION_SPRAY | CUTANEOUS | Status: DC | PRN

## 2020-09-13 MED ORDER — LEVETIRACETAM 500 MG PO TABS: 500.0000 mg | ORAL_TABLET | ORAL | Status: DC

## 2020-09-13 MED ORDER — INSULIN ASPART 100 UNIT/ML ~~LOC~~ SOLN
2.0000 [IU] | Freq: Three times a day (TID) | SUBCUTANEOUS | Status: DC
  Administered 2020-09-14 (×2): 2 [IU] via SUBCUTANEOUS

## 2020-09-13 MED ORDER — BUPROPION HCL ER (XL) 150 MG PO TB24
150.0000 mg | ORAL_TABLET | ORAL | Status: DC
  Administered 2020-09-13: 150 mg via ORAL
  Filled 2020-09-13: qty 1

## 2020-09-13 MED ORDER — LIDOCAINE-PRILOCAINE 2.5-2.5 % EX CREA: 1.0000 "application " | TOPICAL_CREAM | CUTANEOUS | Status: DC | PRN

## 2020-09-13 MED ORDER — ALTEPLASE 2 MG IJ SOLR: 2.0000 mg | Freq: Once | INTRAMUSCULAR | Status: DC | PRN

## 2020-09-13 MED ORDER — LEVETIRACETAM 500 MG PO TABS
500.0000 mg | ORAL_TABLET | Freq: Every day | ORAL | Status: DC
  Administered 2020-09-14: 500 mg via ORAL
  Filled 2020-09-13: qty 1

## 2020-09-13 NOTE — Progress Notes (Signed)
OT Cancellation Note  Patient Details Name: [name] MRN: [id number] DOB: [birth year]   Cancelled Treatment:    Reason Eval/Treat Not Completed: Patient at procedure or test/ unavailable Pt currently in hemodialysis.  [place] Circle, OTR/L Acute Rehab Services Pager [phone] Office [phone]     Almon Register 09/13/2020, 9:30 AM

## 2020-09-13 NOTE — Progress Notes (Signed)
PT Cancellation Note  Patient Details Name: [name] MRN: [id number] DOB: Mar 21, 1961   Cancelled Treatment:    Reason Eval/Treat Not Completed: Patient at procedure or test/unavailable patient at HD, RN reports after HD he is scheduled for ECHO and then cartoid [place]. Will attempt to return if time/schedule allow and if he is available.    Windell Norfolk, DPT, PN1   Supplemental Physical Therapist [facility]    Pager [phone] Acute Rehab Office [phone]

## 2020-09-13 NOTE — Progress Notes (Addendum)
NEUROLOGY CONSULTATION PROGRESS NOTE   Date of service: September 13, 2020 Patient Name: [name] MRN:  [id number] DOB:  11/15/1960  Brief HPI  [name] is a 59 y.o. male with PMH significant for diabetes, history of DVTs, prior right frontal ischemic stroke in 6962 which was complicated by seizures, CKD on dialysis, history of bilateral below-knee amputations who presented with slumped face down in a tub, confused, and left sided weakness. The event was unwitnessed. Found to have bit his left lateral tongue and lost control of his bladder and resolving weakness on presentation.  Of note, Keppra dose was decreased about 3 weeks prior to presentation. We increased his KEppra dose and given his prior right MCA stroke presented as a seizure in 2018, we planned an MRI brain without contrast just to make sure there is no new stroke.   Interval Hx   Was notified by team late yesterday that patient was unable to get MRI despite adequate dose of Ativan. He was unable to stay still and the images obtained were inconclusive.  He is awake alert today after dialysis. Denies any pain. No acute events overnight.  Vitals   Vitals:   09/12/20 2100 09/12/20 2145 09/13/20 0009 09/13/20 0409  BP: (!) 158/66 (!) 130/107 128/66 (!) 146/71  Pulse: 66 64 (!) 57 64  Resp: 18 15 14    Temp:  98.7 F (37.1 C) 98.6 F (37 C) 98.7 F (37.1 C)  TempSrc:  Oral Oral Oral  SpO2: 93% 94% 98% 97%  Weight:      Height:         Body mass index is 20.14 kg/m.  Physical Exam   General: Laying comfortably in bed; in no acute distress.  HENT: Normal oropharynx and mucosa. Normal external appearance of ears and nose.  Neck: Supple, no pain or tenderness  CV: No JVD. No peripheral edema.  Pulmonary: Symmetric Chest rise. Normal respiratory effort.  Abdomen: Soft to touch, non-tender.  Ext: No cyanosis, edema, or deformity  Skin: No rash. Normal palpation of skin.   Musculoskeletal: Normal digits and  nails by inspection. No clubbing.   Neurologic Examination  Mental status/Cognition: Alert, oriented to self, place, month and year, good attention.  Speech/language: Fluent, comprehension intact, object naming intact, repetition intact.  Cranial nerves:   CN II Pupils equal and reactive to light, no VF deficits    CN III,IV,VI EOM intact, no gaze preference or deviation, no nystagmus    CN V normal sensation in V1, V2, and V3 segments bilaterally    CN VII no asymmetry, no nasolabial fold flattening    CN VIII normal hearing to speech    CN IX & X normal palatal elevation, no uvular deviation    CN XI 5/5 head turn and 5/5 shoulder shrug bilaterally    CN XII midline tongue protrusion    Motor:  Muscle bulk: poor, tone normal, pronator drift none tremor none Mvmt Root Nerve  Muscle Right Left Comments  SA C5/6 Ax Deltoid 5 4+   EF C5/6 Mc Biceps 5 5   EE C6/7/8 Rad Triceps 5 5   WF C6/7 Med FCR 5 5   WE C7/8 PIN ECU 5 5   F Ab C8/T1 U ADM/FDI 5 5   HF L1/2/3 Fem Illopsoas 5 5   KE L2/3/4 Fem Quad 5 5   DF L4/5 D Peron Tib Ant 5 5   PF S1/2 Tibial Grc/Sol 5 5    Reflexes:  Right Left Comments  Pectoralis      Biceps (C5/6) 1 1   Brachioradialis (C5/6) 1 1    Triceps (C6/7) 1 1    Patellar (L3/4) 1 1    Achilles (S1)      Hoffman      Plantar     Jaw jerk    Sensation:  Light touch Intact throughout   Pin prick    Temperature    Vibration   Proprioception    Coordination/Complex Motor:  - Finger to Nose intact BL - Heel to shin intact BL - Rapid alternating movement are normal   Labs   Basic Metabolic Panel:  Lab Results  Component Value Date   NA 138 09/12/2020   K 5.7 (H) 09/12/2020   CO2 25 09/12/2020   GLUCOSE 178 (H) 09/12/2020   BUN 47 (H) 09/12/2020   CREATININE 9.60 (H) 09/12/2020   CALCIUM 9.1 09/12/2020   GFRNONAA 6 (L) 09/12/2020   GFRAA 14 (L) 05/27/2020   HbA1c:  Lab Results  Component Value Date   HGBA1C 6.7 (H) 09/13/2020   LDL:   Lab Results  Component Value Date   LDLCALC 51 09/13/2020   Urine Drug Screen:     Component Value Date/Time   LABOPIA NONE DETECTED 04/13/2017 1455   COCAINSCRNUR NONE DETECTED 04/13/2017 1455   LABBENZ NONE DETECTED 04/13/2017 1455   AMPHETMU NONE DETECTED 04/13/2017 1455   THCU POSITIVE (A) 04/13/2017 1455   LABBARB NONE DETECTED 04/13/2017 1455    Alcohol Level     Component Value Date/Time   ETH <10 09/12/2020 1115   No results found for: PHENYTOIN, ZONISAMIDE, LAMOTRIGINE, LEVETIRACETA No results found for: PHENYTOIN, PHENOBARB, VALPROATE, CBMZ  Imaging and Diagnostic studies  Results for orders placed during the hospital encounter of 09/12/20  MR Brain Wo Contrast (neuro protocol) 1. Incomplete examination consisting of only a partial DWI acquisition. This does not allow assessment for an acute infarct. 2. Chronic right MCA infarct.  CTH without contrast: There is no acute intracranial hemorrhage or evidence of acute infarction. ASPECT score is 10.  Age-indeterminate anterior right thalamic infarct.  Progression of parenchymal volume loss and chronic microvascular ischemic changes since 2018.  Impression   [name] is a 59 y.o. male with PMH significant for diabetes, history of DVTs, prior right frontal ischemic stroke in 3300 which was complicated by seizures, CKD on dialysis, history of bilateral below-knee amputations who presented with slumped face down in a tub, confused, and left sided weakness about 3 weeks after decrease in KEppra dose. His neurologic examination is notable for mild LUE weakness which is residual from his prior stroke.  Recommendations  - Unable to stay still for MRI Brain. Given low suspicion for stroke, we will get a repeat CTH today to evaluate for any large territory stroke. No further workup necessary if the [facility] is negative. - Continue Keppra 5839m daily. Take an additional 50539mon days of dialysis, right after dialysis -  No driving for 39m539month Follow up with neurologist outpatient. ______________________________________________________________________   Thank you for the opportunity to take part in the care of this patient. If you have any further questions, please contact the neurology consultation attending.  Signed,  SalDonnetta Simpersiad [facility] Pager Number [phone] precautions: Per [facility], patients with seizures are not allowed to drive until they have been seizure-free for six months and cleared by a physician    Use caution when using heavy equipment  or power tools. Avoid working on ladders or at heights. Take showers instead of baths. Ensure the water temperature is not too high on the home water heater. Do not go swimming alone. Do not lock yourself in a room alone (i.e. bathroom). When caring for infants or small children, sit down when holding, feeding, or changing them to minimize risk of injury to the child in the event you have a seizure. Maintain good sleep hygiene. Avoid alcohol.    If patient has another seizure, call 911 and bring them back to the ED if: A.  The seizure lasts longer than 5 minutes.      B.  The patient doesn't wake shortly after the seizure or has new problems such as difficulty seeing, speaking or moving following the seizure C.  The patient was injured during the seizure D.  The patient has a temperature over 102 F (39C) E.  The patient vomited during the seizure and now is having trouble breathing    During the Seizure   - First, ensure adequate ventilation and place patients on the floor on their left side  Loosen clothing around the neck and ensure the airway is patent. If the patient is clenching the teeth, do not force the mouth open with any object as this can cause severe damage - Remove all items from the surrounding that can be hazardous. The patient may be oblivious to what's happening and may not even know what  he or she is doing. If the patient is confused and wandering, either gently guide him/her away and block access to outside areas - Reassure the individual and be comforting - Call 911. In most cases, the seizure ends before EMS arrives. However, there are cases when seizures may last over 3 to 5 minutes. Or the individual may have developed breathing difficulties or severe injuries. If a pregnant patient or a person with diabetes develops a seizure, it is prudent to call an ambulance. - Finally, if the patient does not regain full consciousness, then call EMS. Most patients will remain confused for about 45 to 90 minutes after a seizure, so you must use judgment in calling for help. - Avoid restraints but make sure the patient is in a bed with padded side rails - Place the individual in a lateral position with the neck slightly flexed; this will help the saliva drain from the mouth and prevent the tongue from falling backward - Remove all nearby furniture and other hazards from the area - Provide verbal assurance as the individual is regaining consciousness - Provide the patient with privacy if possible - Call for help and start treatment as ordered by the caregiver    After the Seizure (Postictal Stage)   After a seizure, most patients experience confusion, fatigue, muscle pain and/or a headache. Thus, one should permit the individual to sleep. For the next few days, reassurance is essential. Being calm and helping reorient the person is also of importance.   Most seizures are painless and end spontaneously. Seizures are not harmful to others but can lead to complications such as stress on the lungs, brain and the heart. Individuals with prior lung problems may develop labored breathing and respiratory distress.

## 2020-09-13 NOTE — Evaluation (Signed)
Speech Language Pathology Evaluation Patient Details Name: ARY LAVINE MRN: [id number] DOB: 10/29/1960 Today's Date: 09/13/2020 Time: 7782-4235 SLP Time Calculation (min) (ACUTE ONLY): 20 min  Problem List:  Patient Active Problem List   Diagnosis Date Noted  . CVA (cerebral vascular accident) ([place]) 09/12/2020  . AMS (altered mental status) 09/12/2020  . Hyperkalemia 09/12/2020  . Fall at home, initial encounter 09/12/2020  . Mild cognitive impairment 09/30/2019  . Anemia 01/05/2019  . Peripheral vascular disease ([place]) 06/28/2018  . Pressure injury of skin 12/04/2017  . S/P bilateral BKA (below knee amputation) ([place]) 10/20/2017  . Atherosclerosis of native arteries of extremity with rest pain ([place]) 08/05/2017  . History of CVA (cerebrovascular accident) 04/15/2017  . Seizure disorder ([place]) 04/15/2017  . End stage renal failure on dialysis ([place]) 04/12/2016  . Aphthae 02/20/2016  . Hidradenitis 02/20/2016  . Leg pain 02/20/2016  . Neuropathy 02/20/2016  . Narrowing of intervertebral disc space 08/29/2015  . Vascular disorder of lower extremity 08/29/2015  . Failure of erection 08/29/2015  . Hypercholesteremia 08/29/2015  . Hypertension 08/29/2015  . Anemia due to chronic kidney disease 05/26/2015  . Venous insufficiency of leg 09/04/2014  . History of deep vein thrombosis (DVT) of lower extremity 08/16/2014  . Prostatic intraepithelial neoplasia 11/02/2013  . Elevated prostate specific antigen (PSA) 09/11/2013  . Benign prostatic hyperplasia with urinary obstruction 08/13/2013  . Spermatocele 08/13/2013  . Avitaminosis D 01/25/2013  . Type 2 diabetes mellitus with kidney complication, with long-term current use of insulin ([place]) 03/28/2012  . Crohn's disease ([place]) 08/03/2011   Past Medical History:  Past Medical History:  Diagnosis Date  . Crohn disease ([place])   . Diabetes mellitus without complication ([place])   . DVT of lower extremity (deep venous thrombosis) ([place])  2016  . Empyema ([place]) 05/20/2017  . Encephalopathy 12/04/2017  . Hidradenitis suppurativa   . ICH (intracerebral hemorrhage) ([place])   . Peritonitis ([place]) 04/21/2017  . Pyogenic arthritis of knee ([place]) 02/04/2016  . Renal disorder   . Sepsis ([place]) 01/12/2018   Past Surgical History:  Past Surgical History:  Procedure Laterality Date  . ABDOMINAL SURGERY    . AMPUTATION FINGER Left 06/2019   [facility]  . ANGIOPLASTY Left    [facility]-11-2018  . BELOW KNEE LEG AMPUTATION Right 08/2017   [facility]  . DIALYSIS/PERMA CATHETER INSERTION N/A 12/09/2017   Procedure: DIALYSIS/PERMA CATHETER INSERTION;  Surgeon: Katha Cabal, MD;  Location: [place] [facility];  Service: Cardiovascular;  Laterality: N/A;  . DIALYSIS/PERMA CATHETER INSERTION N/A 12/12/2017   Procedure: DIALYSIS/PERMA CATHETER INSERTION;  Surgeon: Algernon Huxley, MD;  Location: [place] [facility];  Service: Cardiovascular;  Laterality: N/A;  . DIALYSIS/PERMA CATHETER REMOVAL Left 12/09/2017   Procedure: DIALYSIS/PERMA CATHETER REMOVAL;  Surgeon: Katha Cabal, MD;  Location: [place] [facility];  Service: Cardiovascular;  Laterality: Left;  . KNEE SURGERY Left 02/04/2016   [facility]  . LEG AMPUTATION THROUGH LOWER TIBIA AND FIBULA Left 06/22/2018   [facility]  . LOWER EXTREMITY ANGIOGRAPHY Right 08/08/2017   Procedure: Lower Extremity Angiography;  Surgeon: Algernon Huxley, MD;  Location: [place] [facility];  Service: Cardiovascular;  Laterality: Right;  . LOWER EXTREMITY ANGIOGRAPHY Right 08/22/2017   Procedure: Lower Extremity Angiography;  Surgeon: Algernon Huxley, MD;  Location: [place] [facility];  Service: Cardiovascular;  Laterality: Right;  . LOWER EXTREMITY INTERVENTION  08/08/2017   Procedure: LOWER EXTREMITY INTERVENTION;  Surgeon: Algernon Huxley,  MD;  Location: [place] [facility];  Service: Cardiovascular;;  . LOWER EXTREMITY INTERVENTION  08/22/2017   Procedure: LOWER EXTREMITY INTERVENTION;   Surgeon: Algernon Huxley, MD;  Location: [place] [facility];  Service: Cardiovascular;;   HPI:  59 y.o. male with PMH significant for diabetes, history of DVTs, prior right frontal ischemic stroke in 4327 which was complicated by seizures, CKD on dialysis, history of bilateral below-knee amputations who presented with slumped face down in a tub, confused, and left sided weakness. CT head negative, concern for breakthrough seizure given recent reduction of Keppra. At baseline, pt with memory loss; leads sedentary lifestyle.    Assessment / Plan / Recommendation Clinical Impression  Pt presents with acute on chronic cognitive changes.  Hx of right frontal CVA with deficits in memory per wife, who was present for exam.  Pt demonstrated difficulty with selective attention, being highly distractible by auditory and visual stimuli in immediate environment.  Short term recall was poor, and slightly worse than usual according to Mrs. Curley.  Pt's speech was clear and fluent; language intact.  His response time was slow and processing was delayed.  With cues, he was able to complete simple mental calculations requiring working memory. He was able to problem-solve through basic tasks while preparing his lunch tray to be eaten.  Given low cognitive demands in his home environment and improvements thus far in cognition since admission, no SLP f/u is recommended. D/W pt, wife, who agree.     SLP Assessment  SLP Recommendation/Assessment: Patient does not need any further Speech Lanaguage Pathology Services SLP Visit Diagnosis: Cognitive communication deficit (R41.841)    Follow Up Recommendations  None    Frequency and Duration           SLP Evaluation Cognition  Overall Cognitive Status: Impaired/Different from baseline Arousal/Alertness: Awake/alert Orientation Level: Disoriented to time;Oriented to person;Oriented to place Attention: Sustained Sustained Attention: Impaired Sustained Attention  Impairment: Verbal basic Memory: Impaired Memory Impairment: Storage deficit;Retrieval deficit Awareness: Impaired Awareness Impairment: Intellectual impairment Safety/Judgment: Impaired       Comprehension  Auditory Comprehension Overall Auditory Comprehension: Appears within functional limits for tasks assessed Reading Comprehension Reading Status: Not tested    Expression Expression Primary Mode of Expression: Verbal Verbal Expression Overall Verbal Expression: Appears within functional limits for tasks assessed Written Expression Dominant Hand: Right Written Expression: Not tested   Oral / Motor  Oral Motor/Sensory Function Overall Oral Motor/Sensory Function: Within functional limits Motor Speech Overall Motor Speech: Appears within functional limits for tasks assessed   GO                    Juan Quam Laurice 09/13/2020, 2:56 PM  Tyshea Imel L. Tivis Ringer, [place] Office number [phone] Pager [phone]

## 2020-09-13 NOTE — Progress Notes (Addendum)
PROGRESS NOTE  [name] HUD:[id number] DOB: [birth year]   PCP: [name], MD  Patient is from: Home.  Lives with his wife.  Uses wheelchair at baseline.  Able to transfer  DOA: 09/12/2020 LOS: 1  Chief complaints: Altered mental status and possible fall  Brief Narrative / Interim history: 59 year old male with history of ESRD on HD MWF, prior right frontal ischemic CVA, DM-2, bilateral BKA, seizure disorder and DVTs brought to ED by EMS as code stroke due to left-sided weakness.  Reportedly somewhat confused when he woke up in the morning and slipped and fell in the bathroom with face and head first.  He might have some LOC.  He also reports sore tongue and loss of bladder control during this event.  Reportedly, his Keppra dose was recently cut down to 250 mg daily with an additional 250 mg on dialysis days.  In ED, SBP as high as 201.  WBC 15.7.  Hgb 8.8> 9.9.  Platelet 134.  K5.5. Cr 9.57.  BUN 48. P 5.4.  Coag labs and EtOH within normal.  CT head without contrast without acute finding and aspect 10.  EKG NSR with LVH.  Influenza and COVID-19 PCR nonreactive.  MRI brain attempted but patient was not able to tolerate even with light sedation with doses of Ativan.  Neurology increased his Keppra to 500 mg twice daily.  Nephrology consulted  Subjective: Seen and examined earlier this morning while on dialysis.  No major events overnight of this morning.  No complaints.  Denies headache, vision change, chest pain, shortness of breath, GI or UTI symptoms.  Still feels some soreness in his tongue on the left.  Denies focal numbness or tingling or weakness.  Objective: Vitals:   09/13/20 1030 09/13/20 1100 09/13/20 1144 09/13/20 1239  BP: (!) 162/65 (!) 166/42 (!) 143/56 (!) 146/71  Pulse:    67  Resp:   15 16  Temp:   98.7 F (37.1 C) 98.3 F (36.8 C)  TempSrc:   Oral Oral  SpO2:   100% 100%  Weight:   65.5 kg   Height:        Intake/Output Summary (Last 24 hours) at  09/13/2020 1319 Last data filed at 09/13/2020 1121 Gross per 24 hour  Intake --  Output 0 ml  Net 0 ml   Filed Weights   09/12/20 1100 09/12/20 1141 09/13/20 1144  Weight: 65.5 kg 65.5 kg 65.5 kg    Examination:  GENERAL: No apparent distress.  Nontoxic. HEENT: MMM.  Vision and hearing grossly intact.  NECK: Supple.  No apparent JVD.  RESP: On RA.  No IWOB.  Fair aeration bilaterally. CVS:  RRR. Heart sounds normal.  ABD/GI/GU: BS+. Abd soft, NTND.  MSK/EXT:  Moves extremities.  Bilateral BKA. SKIN: no apparent skin lesion or wound NEURO: Awake, alert and oriented to self, place and year but not date of months.  No apparent focal neuro deficit. PSYCH: Calm. Normal affect.  Procedures:  None  Microbiology summarized: CHYIF-02 and influenza PCR nonreactive.  Assessment & Plan: Fall at home/possible breakthrough seizure: concern about seizure given recent reduction of his Keppra, tongue bite, loss of bladder control and transient left-sided weakness. Unlikely CVA given quick resolution of his symptoms but TIA is a possibility. CT head without acute finding and aspect 10.  Unfortunately, patient did not tolerate MRI even with light sedation.  EKG sinus rhythm with LVH.  He is on high-dose gabapentin for his renal function. -Neurology recs  -  Keppra load with 1 g IV-done. -Keppra increased to 500 mg daily with additional 500 mg MWF after HD. Verified with neurology -No driving for 6 months until he is seizure-free. -Outpatient follow-up with his neurologist -Follow echocardiogram and EEG -Fall and seizure precautions -Adjust medications for renal function. -PT/OT eval  Uncontrolled hypertension: SBP as high as 201 on arrival but improved. -Resume home medications  ESRD on HD MWF/renal osteodystrophy/hyperkalemia -Per nephrology.  Anemia of renal disease: Baseline Hgb 8-9>> 8.1.  Stable. -Continue home iron supplement  History of Crohn's disease: Stable.  Previously  followed at [facility].  On [name] injection -Outpatient follow-up.  Controlled DM-2: A1c 6.7%. Recent Labs  Lab 09/12/20 1112 09/13/20 0636 09/13/20 1240  GLUCAP 169* 103* 86  -Start SSI-ESRD  Debility/bilateral BKA: Uses wheelchair at baseline. -PT/OT consult  Body mass index is 20.14 kg/m.         DVT prophylaxis:  heparin injection 5,000 Units Start: 09/12/20 2200  Code Status: Full code Family Communication: Patient and/or RN. Available if any question.  Status is: Inpatient  Remains inpatient appropriate because:Persistent severe electrolyte disturbances, Altered mental status, Ongoing diagnostic testing needed not appropriate for outpatient work up, Unsafe d/c plan and Inpatient level of care appropriate due to severity of illness   Dispo: The patient is from: Home              Anticipated d/c is to: Home              Anticipated d/c date is: 1 day              Patient currently is not medically stable to d/c.       Consultants:  Neurology Nephrology   Sch Meds:  Scheduled Meds: . amLODipine  10 mg Oral QHS  . aspirin  81 mg Oral Daily  . atorvastatin  80 mg Oral Daily  . [START ON 09/14/2020] buPROPion  150 mg Oral Q72H  . carvedilol  6.25 mg Oral BID  . Chlorhexidine Gluconate Cloth  6 each Topical Q0600  . ferric citrate  420 mg Oral TID WC  . gabapentin  300 mg Oral BID  . heparin  5,000 Units Subcutaneous Q8H  . insulin aspart  0-5 Units Subcutaneous QHS  . insulin aspart  0-6 Units Subcutaneous TID WC  . insulin aspart  2 Units Subcutaneous TID WC  . sevelamer carbonate  800 mg Oral TID with meals   Continuous Infusions: . levETIRAcetam 500 mg (09/13/20 1248)   PRN Meds:.acetaminophen **OR** acetaminophen (TYLENOL) oral liquid 160 mg/5 mL **OR** acetaminophen, hydrALAZINE  Antimicrobials: Anti-infectives (From admission, onward)   None       I have personally reviewed the following labs and images: CBC: Recent Labs  Lab  09/12/20 1115 09/12/20 1119 09/13/20 0722  WBC 15.7*  --  10.2  NEUTROABS 13.8*  --   --   HGB 8.8* 9.9* 8.1*  HCT 29.6* 29.0* 26.3*  MCV 89.4  --  86.8  PLT 134*  --  127*   BMP &GFR Recent Labs  Lab 09/12/20 1115 09/12/20 1119 09/13/20 0722  NA 136 138 138  K 5.5* 5.7* 5.2*  CL 98 102 99  CO2 25  --  23  GLUCOSE 187* 178* 110*  BUN 48* 47* 58*  CREATININE 9.57* 9.60* 10.15*  CALCIUM 9.1  --  8.9  MG 1.9  --   --   PHOS 5.4*  --  5.8*   Estimated Creatinine Clearance: 7.3 mL/min (  A) (by C-G formula based on SCr of 10.15 mg/dL (H)). Liver & Pancreas: Recent Labs  Lab 09/12/20 1115 09/13/20 0722  AST 11*  --   ALT 8  --   ALKPHOS 73  --   BILITOT 0.6  --   PROT 7.3  --   ALBUMIN 3.0* 2.9*   No results for input(s): LIPASE, AMYLASE in the last 168 hours. No results for input(s): AMMONIA in the last 168 hours. Diabetic: Recent Labs    09/13/20 0325  HGBA1C 6.7*   Recent Labs  Lab 09/12/20 1112 09/13/20 0636 09/13/20 1240  GLUCAP 169* 103* 86   Cardiac Enzymes: No results for input(s): CKTOTAL, CKMB, CKMBINDEX, TROPONINI in the last 168 hours. No results for input(s): PROBNP in the last 8760 hours. Coagulation Profile: Recent Labs  Lab 09/12/20 1115  INR 1.1   Thyroid Function Tests: No results for input(s): TSH, T4TOTAL, FREET4, T3FREE, THYROIDAB in the last 72 hours. Lipid Profile: Recent Labs    09/13/20 0325  CHOL 109  HDL 40*  LDLCALC 51  TRIG 90  CHOLHDL 2.7   Anemia Panel: No results for input(s): VITAMINB12, FOLATE, FERRITIN, TIBC, IRON, RETICCTPCT in the last 72 hours. Urine analysis:    Component Value Date/Time   COLORURINE YELLOW (A) 12/04/2017 0240   APPEARANCEUR HAZY (A) 12/04/2017 0240   LABSPEC 1.011 12/04/2017 0240   PHURINE 6.0 12/04/2017 0240   GLUCOSEU >=500 (A) 12/04/2017 0240   HGBUR NEGATIVE 12/04/2017 0240   BILIRUBINUR NEGATIVE 12/04/2017 0240   KETONESUR NEGATIVE 12/04/2017 0240   PROTEINUR 100 (A)  12/04/2017 0240   NITRITE NEGATIVE 12/04/2017 0240   LEUKOCYTESUR NEGATIVE 12/04/2017 0240   Sepsis Labs: Invalid input(s): PROCALCITONIN, [place]  Microbiology: Recent Results (from the past 240 hour(s))  Resp Panel by RT-PCR (Flu A&B, Covid) Nasopharyngeal Swab     Status: None   Collection Time: 09/12/20  3:08 PM   Specimen: Nasopharyngeal Swab; Nasopharyngeal(NP) swabs in vial transport medium  Result Value Ref Range Status   SARS Coronavirus 2 by RT PCR NEGATIVE NEGATIVE Final    Comment: (NOTE) SARS-CoV-2 target nucleic acids are NOT DETECTED.  The SARS-CoV-2 RNA is generally detectable in upper respiratory specimens during the acute phase of infection. The lowest concentration of SARS-CoV-2 viral copies this assay can detect is 138 copies/mL. A negative result does not preclude SARS-Cov-2 infection and should not be used as the sole basis for treatment or other patient management decisions. A negative result may occur with  improper specimen collection/handling, submission of specimen other than nasopharyngeal swab, presence of viral mutation(s) within the areas targeted by this assay, and inadequate number of viral copies(<138 copies/mL). A negative result must be combined with clinical observations, patient history, and epidemiological information. The expected result is Negative.  Fact Sheet for Patients:  EntrepreneurPulse.com.au  Fact Sheet for Healthcare Providers:  IncredibleEmployment.be  This test is no t yet approved or cleared by the [place] FDA and  has been authorized for detection and/or diagnosis of SARS-CoV-2 by FDA under an Emergency Use Authorization (EUA). This EUA will remain  in effect (meaning this test can be used) for the duration of the COVID-19 declaration under Section 564(b)(1) of the Act, 21 U.S.C.section 360bbb-3(b)(1), unless the authorization is terminated  or revoked sooner.        Influenza A by PCR NEGATIVE NEGATIVE Final   Influenza B by PCR NEGATIVE NEGATIVE Final    Comment: (NOTE) The Xpert Xpress SARS-CoV-2/FLU/RSV plus assay is intended as  an aid in the diagnosis of influenza from Nasopharyngeal swab specimens and should not be used as a sole basis for treatment. Nasal washings and aspirates are unacceptable for Xpert Xpress SARS-CoV-2/FLU/RSV testing.  Fact Sheet for Patients: EntrepreneurPulse.com.au  Fact Sheet for Healthcare Providers: IncredibleEmployment.be  This test is not yet approved or cleared by the [place] FDA and has been authorized for detection and/or diagnosis of SARS-CoV-2 by FDA under an Emergency Use Authorization (EUA). This EUA will remain in effect (meaning this test can be used) for the duration of the COVID-19 declaration under Section 564(b)(1) of the Act, 21 U.S.C. section 360bbb-3(b)(1), unless the authorization is terminated or revoked.  Performed at [facility], [place] [address], [place], [place] [address]     Radiology Studies: MR Brain Wo Contrast (neuro protocol)  Result Date: 09/12/2020 CLINICAL DATA:  Stroke follow-up. EXAM: MRI HEAD WITHOUT CONTRAST TECHNIQUE: Multiplanar, multiecho pulse sequences of the brain and surrounding structures were obtained without intravenous contrast. COMPARISON:  Head CT 09/12/2020 FINDINGS: The examination could not be completed due to the patient's condition. Only incomplete axial diffusion imaging was obtained (specifically only the B0 acquisition). This does not allow assessment for an acute infarct. A chronic right MCA infarct is again noted involving the frontal lobe and insula with associated dystrophic calcification. T2 hyperintensities elsewhere in the cerebral white matter bilaterally are incompletely evaluated and nonspecific but may reflect moderate chronic small vessel ischemic disease. There is age advanced cerebral atrophy.  There is no significant intracranial mass effect or sizable extra-axial fluid collection. IMPRESSION: 1. Incomplete examination consisting of only a partial DWI acquisition. This does not allow assessment for an acute infarct. 2. Chronic right MCA infarct. Electronically Signed   By: Logan Bores M.D.   On: 09/12/2020 17:46      Milliana Reddoch T. Mastic  If 7PM-7AM, please contact night-coverage [email or web address] 09/13/2020, 1:19 PM

## 2020-09-13 NOTE — Progress Notes (Signed)
  Echocardiogram 2D Echocardiogram has been performed.  [name] F 09/13/2020, 4:09 PM

## 2020-09-13 NOTE — Procedures (Signed)
Patient Name: [name]  MRN: [id number]  Epilepsy Attending: Lora Havens  Referring Physician/Provider: Dr Florina Ou Date: 09/13/2020 Duration: 31.16 mins  Patient history: 59 y.o. male with PMH significant for diabetes, history of DVTs, prior right frontal ischemic stroke in 7414 which was complicated by seizures, CKD on dialysis, history of bilateral below-knee amputations who presented with slumped face down in a tub, confused, and left sided weakness about 3 weeks after decrease in Keppra dose. EEG to evaluate for seizure.  Level of alertness: Awake  AEDs during EEG study: LEV  Technical aspects: This EEG study was done with scalp electrodes positioned according to the 10-20 International system of electrode placement. Electrical activity was acquired at a sampling rate of 500Hz  and reviewed with a high frequency filter of 70Hz  and a low frequency filter of 1Hz . EEG data were recorded continuously and digitally stored.   Description: The posterior dominant rhythm consists of 8 Hz activity of moderate voltage (25-35 uV) seen predominantly in posterior head regions, symmetric and reactive to eye opening and eye closing. Hyperventilation did not show any EEG change.  Physiologic photic driving was not seen during photic stimulation.    IMPRESSION: This study is within normal limits. No seizures or epileptiform discharges were seen throughout the recording.  Idamae Coccia Barbra Sarks

## 2020-09-13 NOTE — Progress Notes (Signed)
Nephrology Follow-Up Consult note   Assessment/Recommendations: [name] is a/an 59 y.o. male with a past medical history significant for past medical history notable for ESRD on HD presenting with weakness concerning for stroke   # ESRD: Dialysis today. 3.5hrs, 2K, 2.5Ca, Na 137, Bicarbonate 35, Dialyzer: NIPRO. Resume MWF after today # Volume/ hypertension: EDW 60.5kg. Run even with dialysis. Holding BP meds for permissive HTN. Restart as neuro sees fit # Anemia of Chronic Kidney Disease: Hemoglobin 8.1. Will try to obtain ESA records. # Secondary Hyperparathyroidism/Hyperphosphatemia: Can continue auryxia with diet. Unclear regimen otherwise. Can obtain records from HD unit if he remains inpatient # Vascular access: LUE AVF # Weakness concerning for stroke: Neuro following. Anti-epileptics per neuro.  #Hyperkalemia: Dialysis today  # Additional recommendations: - Dose all meds for creatinine clearance <10 ml/min  - Unless absolutely necessary, no MRIs with gadolinium.  - Implement save arm precautions. Prefer needle sticks in the dorsum of the hands or wrists. No blood pressure measurements in arm. - If blood transfusion is requested during hemodialysis sessions, please alert [place] prior to the session.   Outpt Orders: MWF, [place], LUE AVF, DFR 600, BFR 400, EDW 60.5kg, NIPRO, 3.5hrs   Recommendations conveyed to primary service.    [facility] 09/13/2020 10:17 AM  ___________________________________________________________  CC: weakness  Interval History/Subjective: Examined on dialysis today.  No significant issues overnight.  Unsure if his weakness is improved.   Medications:  Current Facility-Administered Medications  Medication Dose Route Frequency Provider Last Rate Last Admin  . 0.9 %  sodium chloride infusion  100 mL Intravenous PRN Reesa Chew, MD      . 0.9 %  sodium chloride infusion  100 mL  Intravenous PRN Reesa Chew, MD      . 0.9 %  sodium chloride infusion  75 mL/hr Intravenous Continuous Para Skeans, MD 75 mL/hr at 09/12/20 2122 75 mL/hr at 09/12/20 2122  . acetaminophen (TYLENOL) tablet 650 mg  650 mg Oral Q4H PRN Para Skeans, MD       Or  . acetaminophen (TYLENOL) 160 MG/5ML solution 650 mg  650 mg Per Tube Q4H PRN Para Skeans, MD       Or  . acetaminophen (TYLENOL) suppository 650 mg  650 mg Rectal Q4H PRN Florina Ou V, MD      . alteplase (CATHFLO ACTIVASE) injection 2 mg  2 mg Intracatheter Once PRN Reesa Chew, MD      . aspirin chewable tablet 81 mg  81 mg Oral Daily Para Skeans, MD   81 mg at 09/12/20 2109  . atorvastatin (LIPITOR) tablet 80 mg  80 mg Oral Daily Para Skeans, MD   80 mg at 09/12/20 2109  . Chlorhexidine Gluconate Cloth 2 % PADS 6 each  6 each Topical Q0600 Reesa Chew, MD      . gabapentin (NEURONTIN) capsule 300 mg  300 mg Oral BID Florina Ou V, MD   300 mg at 09/12/20 2109  . heparin injection 1,000 Units  1,000 Units Dialysis PRN Reesa Chew, MD      . heparin injection 5,000 Units  5,000 Units Subcutaneous Q8H Para Skeans, MD   5,000 Units at 09/12/20 2109  . hydrALAZINE (APRESOLINE) injection 10 mg  10 mg Intravenous Q6H PRN Florina Ou V, MD      . insulin aspart (novoLOG) injection 0-5 Units  0-5 Units Subcutaneous QHS Gonfa,  Charlesetta Ivory, MD      . insulin aspart (novoLOG) injection 0-6 Units  0-6 Units Subcutaneous TID WC Gonfa, Taye T, MD      . insulin aspart (novoLOG) injection 2 Units  2 Units Subcutaneous TID WC Gonfa, Taye T, MD      . levETIRAcetam (KEPPRA) IVPB 500 mg/100 mL premix  500 mg Intravenous Q12H Florina Ou V, MD 400 mL/hr at 09/12/20 2232 500 mg at 09/12/20 2232  . lidocaine (PF) (XYLOCAINE) 1 % injection 5 mL  5 mL Intradermal PRN Reesa Chew, MD      . lidocaine-prilocaine (EMLA) cream 1 application  1 application Topical PRN Reesa Chew, MD      . pentafluoroprop-tetrafluoroeth  (GEBAUERS) aerosol 1 application  1 application Topical PRN Reesa Chew, MD      . sevelamer carbonate (RENVELA) tablet 800 mg  800 mg Oral TID with meals Para Skeans, MD   800 mg at 09/12/20 2109      Review of Systems: 10 systems reviewed and negative except per interval history/subjective  Physical Exam: Vitals:   09/13/20 0930 09/13/20 1000  BP: 132/62 (!) 163/57  Pulse:    Resp:    Temp:    SpO2:     No intake/output data recorded.  Intake/Output Summary (Last 24 hours) at 09/13/2020 1017 Last data filed at 09/12/2020 1231 Gross per 24 hour  Intake 100 ml  Output --  Net 100 ml   General: well-appearing, no acute distress HEENT: anicteric sclera, MMM CV: normal rate, no murmurs, no edema Lungs: bilateral chest rise, normal wob Abd: soft, non-tender, non-distended Skin: no visible lesions or rashes Psych: alert, engaged, appropriate mood and affect Neuro: Weakness in left arm and hand Access: LUE AVF w/ bruit and thrill   Test Results I personally reviewed new and old clinical labs and radiology tests Lab Results  Component Value Date   NA 138 09/13/2020   K 5.2 (H) 09/13/2020   CL 99 09/13/2020   CO2 23 09/13/2020   BUN 58 (H) 09/13/2020   CREATININE 10.15 (H) 09/13/2020   CALCIUM 8.9 09/13/2020   ALBUMIN 2.9 (L) 09/13/2020   PHOS 5.8 (H) 09/13/2020

## 2020-09-13 NOTE — Progress Notes (Signed)
EEG complete - results pending 

## 2020-09-14 ENCOUNTER — Inpatient Hospital Stay (HOSPITAL_COMMUNITY): Payer: Medicare Other

## 2020-09-14 DIAGNOSIS — R531 Weakness: Secondary | ICD-10-CM

## 2020-09-14 DIAGNOSIS — I5042 Chronic combined systolic (congestive) and diastolic (congestive) heart failure: Secondary | ICD-10-CM

## 2020-09-14 DIAGNOSIS — Z89511 Acquired absence of right leg below knee: Secondary | ICD-10-CM

## 2020-09-14 DIAGNOSIS — Z89512 Acquired absence of left leg below knee: Secondary | ICD-10-CM

## 2020-09-14 DIAGNOSIS — R4182 Altered mental status, unspecified: Secondary | ICD-10-CM

## 2020-09-14 DIAGNOSIS — R569 Unspecified convulsions: Secondary | ICD-10-CM | POA: Diagnosis not present

## 2020-09-14 DIAGNOSIS — E1129 Type 2 diabetes mellitus with other diabetic kidney complication: Secondary | ICD-10-CM

## 2020-09-14 DIAGNOSIS — I10 Essential (primary) hypertension: Secondary | ICD-10-CM | POA: Diagnosis not present

## 2020-09-14 DIAGNOSIS — W19XXXA Unspecified fall, initial encounter: Secondary | ICD-10-CM | POA: Diagnosis not present

## 2020-09-14 DIAGNOSIS — N186 End stage renal disease: Secondary | ICD-10-CM | POA: Diagnosis not present

## 2020-09-14 LAB — CBC
HCT: 23.6 % — ABNORMAL LOW (ref 39.0–52.0)
Hemoglobin: 7.2 g/dL — ABNORMAL LOW (ref 13.0–17.0)
MCH: 26.4 pg (ref 26.0–34.0)
MCHC: 30.5 g/dL (ref 30.0–36.0)
MCV: 86.4 fL (ref 80.0–100.0)
Platelets: 107 10*3/uL — ABNORMAL LOW (ref 150–400)
RBC: 2.73 MIL/uL — ABNORMAL LOW (ref 4.22–5.81)
RDW: 19.1 % — ABNORMAL HIGH (ref 11.5–15.5)
WBC: 8.3 10*3/uL (ref 4.0–10.5)
nRBC: 0 % (ref 0.0–0.2)

## 2020-09-14 LAB — RENAL FUNCTION PANEL
Albumin: 2.6 g/dL — ABNORMAL LOW (ref 3.5–5.0)
Anion gap: 12 (ref 5–15)
BUN: 33 mg/dL — ABNORMAL HIGH (ref 6–20)
CO2: 26 mmol/L (ref 22–32)
Calcium: 8.5 mg/dL — ABNORMAL LOW (ref 8.9–10.3)
Chloride: 99 mmol/L (ref 98–111)
Creatinine, Ser: 6.65 mg/dL — ABNORMAL HIGH (ref 0.61–1.24)
GFR, Estimated: 9 mL/min — ABNORMAL LOW (ref >60)
Glucose, Bld: 130 mg/dL — ABNORMAL HIGH (ref 70–99)
Phosphorus: 3.8 mg/dL (ref 2.5–4.6)
Potassium: 4 mmol/L (ref 3.5–5.1)
Sodium: 137 mmol/L (ref 135–145)

## 2020-09-14 LAB — FERRITIN: Ferritin: 776 ng/mL — ABNORMAL HIGH (ref 24–336)

## 2020-09-14 LAB — HEPATITIS B CORE ANTIBODY, TOTAL: Hep B Core Total Ab: NONREACTIVE

## 2020-09-14 LAB — MAGNESIUM: Magnesium: 1.8 mg/dL (ref 1.7–2.4)

## 2020-09-14 LAB — GLUCOSE, CAPILLARY
Glucose-Capillary: 119 mg/dL — ABNORMAL HIGH (ref 70–99)
Glucose-Capillary: 142 mg/dL — ABNORMAL HIGH (ref 70–99)

## 2020-09-14 LAB — IRON AND TIBC
Iron: 44 ug/dL — ABNORMAL LOW (ref 45–182)
Saturation Ratios: 35 % (ref 17.9–39.5)
TIBC: 127 ug/dL — ABNORMAL LOW (ref 250–450)
UIBC: 83 ug/dL

## 2020-09-14 MED ORDER — CARVEDILOL 12.5 MG PO TABS
25.0000 mg | ORAL_TABLET | Freq: Two times a day (BID) | ORAL | Status: DC
  Administered 2020-09-14: 25 mg via ORAL
  Filled 2020-09-14: qty 2

## 2020-09-14 MED ORDER — FUROSEMIDE 40 MG PO TABS
80.0000 mg | ORAL_TABLET | Freq: Two times a day (BID) | ORAL | Status: DC
  Administered 2020-09-14: 80 mg via ORAL
  Filled 2020-09-14: qty 2

## 2020-09-14 MED ORDER — LEVETIRACETAM 500 MG PO TABS
ORAL_TABLET | ORAL | 1 refills | Status: DC
Start: 2020-09-14 — End: 2021-05-04

## 2020-09-14 MED ORDER — HYDRALAZINE HCL 20 MG/ML IJ SOLN
10.0000 mg | INTRAMUSCULAR | Status: DC | PRN
  Administered 2020-09-14: 10 mg via INTRAVENOUS
  Filled 2020-09-14: qty 1

## 2020-09-14 MED ORDER — GABAPENTIN 300 MG PO CAPS
300.0000 mg | ORAL_CAPSULE | Freq: Every day | ORAL | 1 refills | Status: DC
Start: 2020-09-14 — End: 2021-04-01

## 2020-09-14 MED ORDER — LISINOPRIL 20 MG PO TABS
20.0000 mg | ORAL_TABLET | Freq: Every day | ORAL | Status: DC
  Administered 2020-09-14: 20 mg via ORAL
  Filled 2020-09-14: qty 1

## 2020-09-14 NOTE — Plan of Care (Signed)
  Problem: Education: Goal: Knowledge of disease or condition will improve Outcome: Progressing Goal: Knowledge of secondary prevention will improve Outcome: Progressing Goal: Knowledge of patient specific risk factors addressed and post discharge goals established will improve Outcome: Progressing   Problem: Coping: Goal: Will verbalize positive feelings about self Outcome: Progressing Goal: Will identify appropriate support needs Outcome: Progressing   Problem: Self-Care: Goal: Ability to participate in self-care as condition permits will improve Outcome: Progressing Goal: Verbalization of feelings and concerns over difficulty with self-care will improve Outcome: Progressing Goal: Ability to communicate needs accurately will improve Outcome: Progressing   Problem: Nutrition: Goal: Risk of aspiration will decrease Outcome: Progressing Goal: Dietary intake will improve Outcome: Progressing   Problem: Ischemic Stroke/TIA Tissue Perfusion: Goal: Complications of ischemic stroke/TIA will be minimized Outcome: Progressing

## 2020-09-14 NOTE — Progress Notes (Signed)
Brief Neuro Update:  CTH negative. No further inpatient neurological workup recommend. Neurology inpatient team will signoff. Follow up with his neurologist outpatient. Please feel free to contact us with any questions or concerns.  [place] Pager Number [phone]

## 2020-09-14 NOTE — Progress Notes (Signed)
Nephrology Follow-Up Consult note   Assessment/Recommendations: [name] is a/an 59 y.o. male with a past medical history significant for past medical history notable for ESRD on HD presenting with weakness concerning for stroke   # ESRD: Dialysis tomorrow if he remains inpatient. 3.5hrs, 2K, 2.5Ca, Na 137, Bicarbonate 35, Dialyzer: NIPRO. # Volume/ hypertension: EDW 60.5kg.   Achieve dry weight as tolerated.  Can restart home blood pressure medicines as needed # Anemia of Chronic Kidney Disease: Hemoglobin  7.2 today.  Check iron stores and consider ESA tomorrow # Secondary Hyperparathyroidism/Hyperphosphatemia: Can continue auryxia with diet. # Vascular access: LUE AVF # Weakness concerning for stroke: Neuro following. Anti-epileptics per neuro. now less concern for stroke based on their work-up #Hyperkalemia: Resolved with dialysis.  Potassium 4 today  # Additional recommendations: - Dose all meds for creatinine clearance <10 ml/min  - Unless absolutely necessary, no MRIs with gadolinium.  - Implement save arm precautions. Prefer needle sticks in the dorsum of the hands or wrists. No blood pressure measurements in arm. - If blood transfusion is requested during hemodialysis sessions, please alert [place] prior to the session.   Outpt Orders: MWF, [place], LUE AVF, DFR 600, BFR 400, EDW 60.5kg, NIPRO, 3.5hrs   Recommendations conveyed to primary service.    [facility] 09/14/2020 10:14 AM  ___________________________________________________________  CC: weakness  Interval History/Subjective: Patient feels well today without any complaints.  Not sure when he will leave   Medications:  Current Facility-Administered Medications  Medication Dose Route Frequency Provider Last Rate Last Admin  . acetaminophen (TYLENOL) tablet 650 mg  650 mg Oral Q4H PRN Para Skeans, MD       Or  . acetaminophen (TYLENOL) 160 MG/5ML solution  650 mg  650 mg Per Tube Q4H PRN Para Skeans, MD       Or  . acetaminophen (TYLENOL) suppository 650 mg  650 mg Rectal Q4H PRN Para Skeans, MD      . amLODipine (NORVASC) tablet 10 mg  10 mg Oral QHS Wendee Beavers T, MD   10 mg at 09/13/20 2222  . aspirin chewable tablet 81 mg  81 mg Oral Daily Para Skeans, MD   81 mg at 09/13/20 1235  . atorvastatin (LIPITOR) tablet 80 mg  80 mg Oral Daily Para Skeans, MD   80 mg at 09/13/20 1235  . buPROPion (WELLBUTRIN XL) 24 hr tablet 150 mg  150 mg Oral Q72H Wendee Beavers T, MD   150 mg at 09/13/20 2333  . carvedilol (COREG) tablet 25 mg  25 mg Oral BID Wendee Beavers T, MD      . Chlorhexidine Gluconate Cloth 2 % PADS 6 each  6 each Topical Q0600 Reesa Chew, MD   6 each at 09/14/20 0549  . ferric citrate (AURYXIA) tablet 420 mg  420 mg Oral TID WC Wendee Beavers T, MD   420 mg at 09/14/20 0828  . furosemide (LASIX) tablet 80 mg  80 mg Oral BID Wendee Beavers T, MD   80 mg at 09/14/20 0828  . gabapentin (NEURONTIN) capsule 300 mg  300 mg Oral QHS Gonfa, Taye T, MD      . heparin injection 5,000 Units  5,000 Units Subcutaneous Q8H Para Skeans, MD   5,000 Units at 09/14/20 0545  . hydrALAZINE (APRESOLINE) injection 10 mg  10 mg Intravenous Q6H PRN Para Skeans, MD      . insulin aspart (  novoLOG) injection 0-5 Units  0-5 Units Subcutaneous QHS Gonfa, Taye T, MD      . insulin aspart (novoLOG) injection 0-6 Units  0-6 Units Subcutaneous TID WC Mercy Riding, MD   1 Units at 09/13/20 1751  . insulin aspart (novoLOG) injection 2 Units  2 Units Subcutaneous TID WC Mercy Riding, MD   2 Units at 09/14/20 0828  . levETIRAcetam (KEPPRA) tablet 500 mg  500 mg Oral Daily Gonfa, Taye T, MD      . levETIRAcetam (KEPPRA) tablet 500 mg  500 mg Oral Q M,W,F,Sa-1800 Gonfa, Taye T, MD      . lisinopril (ZESTRIL) tablet 20 mg  20 mg Oral Daily Gonfa, Taye T, MD      . sevelamer carbonate (RENVELA) tablet 800 mg  800 mg Oral TID with meals Florina Ou V, MD   800 mg at  09/14/20 0698      Review of Systems: 10 systems reviewed and negative except per interval history/subjective  Physical Exam: Vitals:   09/14/20 0600 09/14/20 0815  BP:  (!) 182/79  Pulse:  69  Resp: 19 19  Temp:  98.8 F (37.1 C)  SpO2:  99%   No intake/output data recorded.  Intake/Output Summary (Last 24 hours) at 09/14/2020 1014 Last data filed at 09/13/2020 1121 Gross per 24 hour  Intake --  Output 0 ml  Net 0 ml   General: well-appearing, no acute distress HEENT: anicteric sclera, MMM CV: normal rate, no murmurs, no edema Lungs: bilateral chest rise, normal wob Abd: soft, non-tender, non-distended Skin: no visible lesions or rashes Psych: alert, engaged, appropriate mood and affect Access: LUE AVF w/ bruit and thrill   Test Results I personally reviewed new and old clinical labs and radiology tests Lab Results  Component Value Date   NA 137 09/14/2020   K 4.0 09/14/2020   CL 99 09/14/2020   CO2 26 09/14/2020   BUN 33 (H) 09/14/2020   CREATININE 6.65 (H) 09/14/2020   CALCIUM 8.5 (L) 09/14/2020   ALBUMIN 2.6 (L) 09/14/2020   PHOS 3.8 09/14/2020

## 2020-09-14 NOTE — Progress Notes (Signed)
Occupational Therapy Evaluation Patient Details Name: [name] MRN: [id number] DOB: 05-02-1961 Today's Date: 09/14/2020    History of Present Illness [name] is a 59 y.o. male with medical history significant of  DM II, DVT's, H/O cva in 2018, Bil LE amputee and ESRD on HD. Per wife pt was on way for HD and seemed confused , went to shower and thinks he fell. Pt also had some mumbling. Noted to have tongue biting and urinary incontinence.Pt has h/o seizures. TWS:FKCLEXN right MCA infarct, could not get full MRI due to pt trying to get out of MRI machine   Clinical Impression   Pt admitted with the above and demonstrates the below listed deficits.  He presents with impaired balance, decreased activity tolerance, decreased cognition, impaired safety awareness.  He currently requires min guard assist for ADLs and functional transfers.  He lives with his wife and reports he was able to perform ADLs mod I, but is sedentary and does not perform IADLs.   He has all needed DME.  Recommend HHOT due to decreased endurance, as well as impaired cognition.  All further needs can be addressed by [place].     Follow Up Recommendations  Home health OT;Supervision/Assistance - 24 hour    Equipment Recommendations  None recommended by OT    Recommendations for Other Services       Precautions / Restrictions Precautions Precautions: Fall Precaution Comments: Hx of falls, bil BKAs Required Braces or Orthoses: Other Brace Other Brace: prostheses in room Restrictions Weight Bearing Restrictions: No      Mobility Bed Mobility Overal bed mobility: Needs Assistance Bed Mobility: Supine to Sit     Supine to sit: Supervision;HOB elevated     General bed mobility comments: Pt sitting up on EOB     Transfers Overall transfer level: Needs assistance Equipment used: None;Rolling walker (2 wheeled) Transfers: Sit to/from [facility] Sit to Stand: Min guard Stand pivot  transfers: Min guard       General transfer comment: close min guard assist for safety.  He is slow to rise.  Lt foot occasionally bumps RW leg     Balance Overall balance assessment: Needs assistance;History of Falls Sitting-balance support: Feet supported;No upper extremity supported Sitting balance-Leahy Scale: Good Sitting balance - Comments: Able to donn/doff prostheses 3 times sitting EOB wtihout difficulty as well as initiate donning pants.   Standing balance support: During functional activity Standing balance-Leahy Scale: Poor Standing balance comment: Requires UE support in standing and assist to pull up pants without RW support.                           ADL either performed or assessed with clinical judgement   ADL Overall ADL's : Needs assistance/impaired Eating/Feeding: Independent   Grooming: Wash/dry hands;Oral care;Wash/dry face;Brushing hair;Min guard;Standing   Upper Body Bathing: Set up;Supervision/ safety;Sitting   Lower Body Bathing: Min guard;Sit to/from stand   Upper Body Dressing : Set up;Supervision/safety;Sitting   Lower Body Dressing: Min guard;Sit to/from stand   Toilet Transfer: Min guard;Ambulation;Comfort height toilet;BSC;Grab bars;RW   Toileting- Water quality scientist and Hygiene: Min guard;Sit to/from stand       Functional mobility during ADLs: Min guard       Vision Baseline Vision/History: Wears glasses Wears Glasses: At all times Patient Visual Report: No change from baseline       Perception Perception Perception Tested?: Yes   Praxis Praxis Praxis tested?: Within functional  limits    Pertinent Vitals/Pain Pain Assessment: No/denies pain     Hand Dominance Right   Extremity/Trunk Assessment Upper Extremity Assessment Upper Extremity Assessment: Generalized weakness   Lower Extremity Assessment Lower Extremity Assessment: Defer to PT evaluation   Cervical / Trunk Assessment Cervical / Trunk  Assessment: Normal   Communication Communication Communication: No difficulties   Cognition Arousal/Alertness: Awake/alert Behavior During Therapy: WFL for tasks assessed/performed Overall Cognitive Status: Impaired/Different from baseline Area of Impairment: Memory;Attention;Awareness;Problem solving                   Current Attention Level: Sustained Memory: Decreased short-term memory     Awareness: Intellectual Problem Solving: Slow processing;Requires verbal cues General Comments: Does not recall incident leading to admission. "it is all a little fuzzy" Reports h/o memory deficits. Oriented to month, year. He is easily Distracted.Pt donned prosteheses, then doffed them as he forgot to don his pants.  He requires redirection to task frequently due to distractiblity.  Requires min - mod cues for safety awareness.  Wife not present to provide info re: baseline    General Comments  Pt reports prosthesis rub his limbs causing pain.  He has not contacted his prosthetists about this - encouraged him to do so     Exercises     Shoulder Instructions      Home Living Family/patient expects to be discharged to:: Private residence Living Arrangements: Spouse/significant other;Children Available Help at Discharge: Family;Available PRN/intermittently Type of Home: House Home Access: Stairs to enter;Ramped entrance Entrance Stairs-Number of Steps: 4 Entrance Stairs-Rails: Right;Left;Can reach both Home Layout: One level     Bathroom Shower/Tub: Teacher, early years/pre: Handicapped height     Home Equipment: Tub bench;Walker - 2 wheels;Crutches      Lives With: Spouse    Prior Functioning/Environment Level of Independence: Independent with assistive device(s)        Comments: Does ADLs independently, does not do any IADLs. Reports 1 fall. Reports using RW for ambulation.        OT Problem List: Decreased activity tolerance;Impaired balance (sitting  and/or standing);Decreased cognition;Decreased safety awareness;Decreased knowledge of use of DME or AE      OT Treatment/Interventions:      OT Goals(Current goals can be found in the care plan section) Acute Rehab OT Goals Patient Stated Goal: to be able to drive  OT Goal Formulation: All assessment and education complete, DC therapy  OT Frequency:     Barriers to D/C:            Co-evaluation PT/OT/SLP Co-Evaluation/Treatment: Yes Reason for Co-Treatment: To address functional/ADL transfers PT goals addressed during session: Mobility/safety with mobility;Balance;Strengthening/ROM;Proper use of DME OT goals addressed during session: ADL's and self-care      AM-PAC OT "6 Clicks" Daily Activity     Outcome Measure Help from another person eating meals?: None Help from another person taking care of personal grooming?: A Little Help from another person toileting, which includes using toliet, bedpan, or urinal?: A Little Help from another person bathing (including washing, rinsing, drying)?: A Little Help from another person to put on and taking off regular upper body clothing?: A Little Help from another person to put on and taking off regular lower body clothing?: A Little 6 Click Score: 19   End of Session Equipment Utilized During Treatment: Rolling walker Nurse Communication: Mobility status  Activity Tolerance: Patient tolerated treatment well Patient left: in bed;with call bell/phone within reach;with bed alarm set  OT Visit Diagnosis: Unsteadiness on feet (R26.81);Cognitive communication deficit (R41.841)                Time: 9767-3419 OT Time Calculation (min): 28 min Charges:  OT General Charges $OT Visit: 1 Visit OT Evaluation $OT Eval Moderate Complexity: 1 Mod  Nilsa Nutting., OTR/L Acute Rehabilitation Services Pager [phone] Office [phone]   Lucille Passy M 09/14/2020, 1:03 PM

## 2020-09-14 NOTE — Progress Notes (Signed)
VASCULAR LAB    Carotid duplex has been performed.  See CV proc for preliminary results.   Lilleigh Hechavarria, RVT 09/14/2020, 9:39 AM

## 2020-09-14 NOTE — Evaluation (Signed)
Physical Therapy Evaluation Patient Details Name: [name] MRN: [id number] DOB: Jul 26, 1961 Today's Date: 09/14/2020   History of Present Illness  [name] is a 59 y.o. male with medical history significant of  DM II, DVT's, H/O cva in 2018, Bil LE amputee and ESRD on HD. Per wife pt was on way for HD and seemed confused , went to shower and thinks he fell. Pt also had some mumbling. Noted to have tongue biting and urinary incontinence.Pt has h/o seizures. EHU:DJSHFWY right MCA infarct, could not get full MRI due to pt trying to get out of MRI machine  Clinical Impression  Patient presents with generalized weakness, impaired balance, impaired cognition and impaired mobility s/p above. Pt reports being mod I with RW and ADLs PTA. Wife does IADLs. Reports some falls at home. Today, pt requires Min guard-Min A for transfers and gait training due to left foot bumping into the walker leg, RW proximity and BLE weakness. Noted to have cognitive deficits related to attention, memory and problem solving as well. Recommend close supervision for OOB mobility at home as well as use of RW for safety. Will follow acutely to maximize independence and mobility prior to return home.    Follow Up Recommendations Home health PT;Supervision for mobility/OOB    Equipment Recommendations  None recommended by PT    Recommendations for Other Services       Precautions / Restrictions Precautions Precautions: Fall Precaution Comments: Hx of falls, bil BKAs Required Braces or Orthoses: Other Brace Other Brace: prostheses in room Restrictions Weight Bearing Restrictions: No      Mobility  Bed Mobility Overal bed mobility: Needs Assistance Bed Mobility: Supine to Sit     Supine to sit: Supervision;HOB elevated     General bed mobility comments: Comes into long sitting without difficulty.    Transfers Overall transfer level: Needs assistance Equipment used: None;Rolling walker (2  wheeled) Transfers: Sit to/from Stand Sit to Stand: Min guard         General transfer comment: Close Min guard for safety. Slow to rise, needing RW for support once upright. Stood from EOB x3  Ambulation/Gait Ambulation/Gait assistance: Min guard;Min Web designer (Feet): 75 Feet Assistive device: Rolling walker (2 wheeled) Gait Pattern/deviations: Step-through pattern;Trunk flexed Gait velocity: decreased Gait velocity interpretation: <1.31 ft/sec, indicative of household ambulator General Gait Details: Slow, unsteady gait with LLE turned out into hip ER with left foot bumping into the RW leg numerous times despite cues. Cues for RW proximity/management esp with turns. Bil knee instability noted but no buckling.  Stairs            Wheelchair Mobility    Modified Rankin (Stroke Patients Only)       Balance Overall balance assessment: Needs assistance;History of Falls Sitting-balance support: Feet supported;No upper extremity supported Sitting balance-Leahy Scale: Good Sitting balance - Comments: Able to donn/doff prostheses 3 times sitting EOB wtihout difficulty as well as initiate donning pants.   Standing balance support: During functional activity Standing balance-Leahy Scale: Poor Standing balance comment: Requires UE support in standing and assist to pull up pants without RW support.                             Pertinent Vitals/Pain Pain Assessment: No/denies pain    Home Living Family/patient expects to be discharged to:: Private residence Living Arrangements: Spouse/significant other;Children Available Help at Discharge: Family;Available PRN/intermittently Type of Home: [place]  Access: Stairs to enter;Ramped entrance Entrance Stairs-Rails: Right;Left;Can reach both Entrance Stairs-Number of Steps: 4 Home Layout: One level Home Equipment: Tub bench;Walker - 2 wheels;Crutches      Prior Function Level of Independence: Independent  with assistive device(s)         Comments: Does ADLs independently, does not do any IADLs. Reports 1 fall. Reports using RW for ambulation.     Hand Dominance   Dominant Hand: Right    Extremity/Trunk Assessment   Upper Extremity Assessment Upper Extremity Assessment: Defer to OT evaluation    Lower Extremity Assessment Lower Extremity Assessment: Generalized weakness (but functional)    Cervical / Trunk Assessment Cervical / Trunk Assessment: Normal  Communication   Communication: No difficulties  Cognition Arousal/Alertness: Awake/alert Behavior During Therapy: WFL for tasks assessed/performed Overall Cognitive Status: Impaired/Different from baseline Area of Impairment: Memory;Attention;Awareness;Problem solving                   Current Attention Level: Sustained Memory: Decreased short-term memory     Awareness: Intellectual Problem Solving: Slow processing;Requires verbal cues General Comments: Does not recall incident leading to admission. "it is all a little fuzzy" Reports memory is not good anyways. Oriented to month, year. Distractable. Pt donned prostheses and then doffed them asking for gauze to place over residual limb. Donned them again and then doffed them due to forgetting to put on pants first.      General Comments General comments (skin integrity, edema, etc.): Placed foam pad on right residual limb due to bony prominence causing pain with ambulation- pt does this at home as well.    Exercises     Assessment/Plan    PT Assessment Patient needs continued PT services  PT Problem List Decreased strength;Decreased mobility;Decreased cognition;Decreased activity tolerance;Decreased balance;Decreased knowledge of use of DME       PT Treatment Interventions Therapeutic activities;DME instruction;Gait training;Therapeutic exercise;Patient/family education;Balance training;Functional mobility training    PT Goals (Current goals can be found in  the Care Plan section)  Acute Rehab PT Goals Patient Stated Goal: to be able to drive PT Goal Formulation: With patient Time For Goal Achievement: 09/28/20 Potential to Achieve Goals: Fair    Frequency Min 3X/week   Barriers to discharge        Co-evaluation PT/OT/SLP Co-Evaluation/Treatment: Yes Reason for Co-Treatment: To address functional/ADL transfers PT goals addressed during session: Mobility/safety with mobility;Balance;Strengthening/ROM;Proper use of DME         AM-PAC PT "6 Clicks" Mobility  Outcome Measure Help needed turning from your back to your side while in a flat bed without using bedrails?: None Help needed moving from lying on your back to sitting on the side of a flat bed without using bedrails?: None Help needed moving to and from a bed to a chair (including a wheelchair)?: A Little Help needed standing up from a chair using your arms (e.g., wheelchair or bedside chair)?: A Little Help needed to walk in hospital room?: A Little Help needed climbing 3-5 steps with a railing? : A Little 6 Click Score: 20    End of Session   Activity Tolerance: Patient limited by fatigue;Patient tolerated treatment well Patient left: in bed;with call bell/phone within reach;with bed alarm set (sitting EOB) Nurse Communication: Mobility status PT Visit Diagnosis: Difficulty in walking, not elsewhere classified (R26.2);Muscle weakness (generalized) (M62.81)    Time: 1761-6073 PT Time Calculation (min) (ACUTE ONLY): 39 min   Charges:   PT Evaluation $PT Eval Moderate Complexity: 1 Mod PT  Treatments $Gait Training: 8-22 mins        Marisa Severin, PT, DPT Acute Rehabilitation Services Pager [phone] Office [phone]      Lacie Draft 09/14/2020, 12:40 PM

## 2020-09-14 NOTE — TOC Transition Note (Signed)
Transition of Care [facility]) - CM/SW Discharge Note   Patient Details  Name: [name] MRN: [id number] Date of Birth: 1961-06-16  [facility] CM/SW Contact:  Pollie Friar, RN Phone Number: 09/14/2020, 2:48 PM   Clinical Narrative:    Pt is discharging home with resumption of [place] services through [facility]. CM called Britney with [facility] and informed her of d/c and orders.  No DME needs.  Spouse to provide transport home.    Final next level of care: Home w Home Health Services Barriers to Discharge: No Barriers Identified   Patient Goals and CMS Choice   CMS Medicare.gov Compare Post Acute Care list provided to:: Patient Choice offered to / list presented to : Patient  Discharge Placement                       Discharge Plan and Services                          HH Arranged: PT, OT [facility] Agency: [facility] [place] Date [facility] Agency Contacted: 09/14/20   Representative spoke with at [place]: [place] Interventions     Readmission Risk Interventions No flowsheet data found.

## 2020-09-14 NOTE — Discharge Summary (Signed)
Physician Discharge Summary  Medford Staheli [name]:[id number] DOB: [birth year] DOA: 09/12/2020  PCP: [name], MD  Admit date: 09/12/2020 Discharge date: 09/14/2020  Admitted From: Home Disposition: Home  Recommendations for Outpatient Follow-up:  1. Follow ups as below. 2. Please obtain CBC/BMP/Mag at follow up 3. Please follow up on the following pending results: Final result on carotid ultrasound  Home Health: PT/OT Equipment/Devices: Patient has appropriate DME  Discharge Condition: Stable CODE STATUS: Full code   Follow-up [name], [facility] Follow up.   Specialty: Home Health Services Why: The home health agency will contact you for the first home visit. Contact information: [address] [place] [address] [phone]              Hospital Course: 59 year old male with history of ESRD on HD MWF, prior right frontal ischemic CVA, DM-2, bilateral BKA, seizure disorder and DVTs brought to ED by EMS as code stroke due to left-sided weakness.  Reportedly somewhat confused when he woke up in the morning and slipped and fell in the bathroom with face and head first.  He might have some LOC.  He also reports sore tongue and loss of bladder control during this event.  Reportedly, his Keppra dose was recently cut down to 250 mg daily with an additional 250 mg on dialysis days.  In ED, SBP as high as 201.  WBC 15.7.  Hgb 8.8> 9.9.  Platelet 134.  K5.5. Cr 9.57.  BUN 48. P 5.4.  Coag labs and EtOH within normal.  CT head without contrast without acute finding and aspect 10.  EKG NSR with LVH.  Influenza and COVID-19 PCR nonreactive.  MRI brain attempted but patient was not able to tolerate even with light sedation with doses of Ativan.  Neurology increased his Keppra to 500 mg daily with additional dose of 500 mg after dialysis on dialysis days. Nephrology consulted, and dialyzed patient.  EEG negative for seizure or epileptiform discharge.   TTE with LVEF of 40 to 45% moderate LVH, global hypokinesis and G2DD.  It is unclear if echo finding is new or old as there is no prior echo to compare to.  Preliminary result on carotid Doppler without significant finding.  On the day of discharge, patient felt well.  Neurology signed off and did not recommend any further inpatient neurological work-up.  Cleared by nephrology as well.  Therapy recommended home health PT/OT.  Discussed findings and plan with patient's wife over the phone prior to discharge.  See individual problem list below for more on hospital course.  Discharge Diagnoses:  Fall at home/possible breakthrough seizure: concern about seizure given recent reduction of his Keppra, tongue bite, loss of bladder control and transient left-sided weakness. Unlikely CVA given quick resolution of his symptoms but TIA is a possibility. CT head without acute finding and aspect 10.  Unfortunately, patient did not tolerate MRI even with light sedation.  EKG sinus rhythm with LVH.  He is on high-dose gabapentin for his renal function.  EEG negative for seizure or epileptiform discharge.  TTE finding as above. -Neurology recommended Keppra to 500 mg daily with additional 500 mg MWF after HD and cleared patient for discharge with outpatient follow-up with his neurologist. -Outpatient follow-up with his neurologist -Adjusted medications for renal function-reduced his gabapentin. -Home health PT/OT ordered.  Patient has appropriate DME's.  Uncontrolled hypertension: Improved but slightly elevated partly in the setting of holding his home antihypertensive medication -  Resume home medications -Reassess at follow-up.  Chronic combined CHF: unclear if this is acute or chronic as there is no prior echo to compare to. -Fluid management with dialysis -Continue home Lasix 80 mg twice daily-still makes some urine. -GDMT-already on Coreg and lisinopril.  ESRD on HD MWF/renal  osteodystrophy/hyperkalemia -Per nephrology.  Anemia of renal disease: Baseline Hgb 8-9>> 8.1> 7.2.  Drop is likely dilutional as all the cell lines dropped. Was not able to take fluid off with HD yesterday due to hypotension. -Continue home iron supplement  -Recheck at follow-up.  History of Crohn's disease: Stable.  Previously followed at [facility].  On [name] injection -Outpatient follow-up.  Controlled DM-2: A1c 6.7%. -Continue home medications   Debility/bilateral BKA: Uses wheelchair at baseline. -PT/OT ordered.   Body mass index is 20.14 kg/m.            Discharge Exam: Vitals:   09/14/20 1245 09/14/20 1349  BP: (!) 196/87 (!) 161/73  Pulse:    Resp:    Temp:    SpO2:      GENERAL: No apparent distress.  Sitting on the edge of the bed. HEENT: MMM.  Vision and hearing grossly intact.  NECK: Supple.  No apparent JVD.  RESP: On RA.  No IWOB.  Fair aeration bilaterally. CVS:  RRR. Heart sounds normal.  ABD/GI/GU: Bowel sounds present. Soft. Non tender.  MSK/EXT: Bilateral BKA. SKIN: no apparent skin lesion or wound NEURO: Awake, alert and oriented appropriately.  No apparent focal neuro deficit. PSYCH: Calm. Normal affect.   Discharge Instructions  Discharge Instructions    Call MD for:  difficulty breathing, headache or visual disturbances   Complete by: As directed    Call MD for:  persistant dizziness or light-headedness   Complete by: As directed    Diet - low sodium heart healthy   Complete by: As directed    With fluid restriction to less than 1200 cc a day   Diet Carb Modified   Complete by: As directed    Discharge instructions   Complete by: As directed    It has been a pleasure taking care of you!  You were hospitalized after fall and confusion and likely due to breakthrough seizure.  Our neurologist have adjusted your seizure medication.  Please review your new medication list and the directions on your medications before you take them.   Please follow-up with your neurologist in 2 to 3 weeks or sooner if needed.   Take care,   Increase activity slowly   Complete by: As directed      Allergies as of 09/14/2020      Reactions   Methotrexate Other (See Comments)   Blood count drops   Vancomycin Shortness Of Breath   Eyes watering, SOB, wheezing   Cefepime Other (See Comments)   Tape       Medication List    STOP taking these medications   buPROPion 150 MG 24 hr tablet Commonly known as: Wellbutrin XL     TAKE these medications   acetaminophen 500 MG tablet Commonly known as: TYLENOL Take 1,000 mg by mouth daily as needed for moderate pain or headache.   Alcohol Swabs Pads Use as directed to check blood sugar three times daily for insulin dependent type 2 diabetes.   amLODipine 10 MG tablet Commonly known as: NORVASC TAKE 1 TABLET(10 MG) BY MOUTH DAILY AS NEEDED What changed: See the new instructions. Notes to patient: Daily at bedtime   atorvastatin 80 MG  tablet Commonly known as: LIPITOR TAKE 1 TABLET(80 MG) BY MOUTH DAILY What changed: See the new instructions.   Auryxia 1 GM 210 MG(Fe) tablet Generic drug: ferric citrate Take 420 mg by mouth in the morning and at bedtime.   carvedilol 25 MG tablet Commonly known as: COREG Take 1 tablet (25 mg total) by mouth 2 (two) times daily.   furosemide 80 MG tablet Commonly known as: LASIX TAKE 1 TABLET(80 MG) BY MOUTH TWICE DAILY What changed: See the new instructions.   gabapentin 300 MG capsule Commonly known as: NEURONTIN Take 1 capsule (300 mg total) by mouth at bedtime. What changed:   medication strength  when to take this   [name] Pen 40 MG/0.4ML Pnkt Generic drug: Adalimumab Inject 40 mg into the muscle once a week.   insulin aspart 100 UNIT/ML FlexPen Commonly known as: NOVOLOG Inject 3 Units into the skin 3 (three) times daily with meals.   insulin glargine 100 UNIT/ML injection Commonly known as: LANTUS Inject 10 Units into  the skin at bedtime.   levETIRAcetam 500 MG tablet Commonly known as: KEPPRA Take 1 tablet (500 mg total) by mouth daily AND 1 tablet (500 mg total) every Monday, Wednesday, and Friday. After dialysis. What changed:   medication strength  See the new instructions.   lisinopril 20 MG tablet Commonly known as: ZESTRIL Take 20 mg by mouth daily. What changed: Another medication with the same name was removed. Continue taking this medication, and follow the directions you see here.   ONE TOUCH ULTRA 2 w/Device Kit Use as directed to check blood sugar three times daily. E11.9   sevelamer carbonate 800 MG tablet Commonly known as: RENVELA Take 1 tablet (800 mg total) by mouth 3 (three) times daily. What changed: when to take this       Consultations:  Neurology  Nephrology  Procedures/Studies:  2D Echo on 09/13/2020 1. Left ventricular ejection fraction, by estimation, is 40 to 45%. The  left ventricle has mildly decreased function. The left ventricle  demonstrates global hypokinesis. There is moderate concentric left  ventricular hypertrophy. Left ventricular  diastolic parameters are consistent with Grade II diastolic dysfunction  (pseudonormalization). Elevated left atrial pressure.  2. Right ventricular systolic function is mildly reduced. The right  ventricular size is normal. Tricuspid regurgitation signal is inadequate  for assessing PA pressure.  3. Left atrial size was mildly dilated.  4. Right atrial size was mildly dilated.  5. The mitral valve is normal in structure. Trivial mitral valve  regurgitation.  6. The aortic valve is tricuspid. There is mild calcification of the  aortic valve. There is mild thickening of the aortic valve. Aortic valve  regurgitation is not visualized. Mild aortic valve sclerosis is present,  with no evidence of aortic valve  stenosis.  7. Aortic dilatation noted. There is mild dilatation of the ascending  aorta, measuring  40 mm.  8. The inferior vena cava is normal in size with <50% respiratory  variability, suggesting right atrial pressure of 8 mmHg.    EEG  Result Date: 09/13/2020 Lora Havens, MD     09/13/2020  5:50 PM Patient Name: [name] MRN: [id number] Epilepsy Attending: Lora Havens Referring Physician/Provider: Dr Florina Ou Date: 09/13/2020 Duration: 31.16 mins Patient history: 59 y.o. male with PMH significant for diabetes, history of DVTs, prior right frontal ischemic stroke in 3704 which was complicated by seizures, CKD on dialysis, history of bilateral below-knee amputations who presented with slumped face  down in a tub, confused, and left sided weakness about 3 weeks after decrease in Keppra dose. EEG to evaluate for seizure. Level of alertness: Awake AEDs during EEG study: LEV Technical aspects: This EEG study was done with scalp electrodes positioned according to the 10-20 International system of electrode placement. Electrical activity was acquired at a sampling rate of _0  and reviewed with a high frequency filter of _1  and a low frequency filter of _2 . EEG data were recorded continuously and digitally stored. Description: The posterior dominant rhythm consists of 8 Hz activity of moderate voltage (25-35 uV) seen predominantly in posterior head regions, symmetric and reactive to eye opening and eye closing. Hyperventilation did not show any EEG change.  Physiologic photic driving was not seen during photic stimulation.  IMPRESSION: This study is within normal limits. No seizures or epileptiform discharges were seen throughout the recording. Lora Havens   CT HEAD WO CONTRAST  Result Date: 09/13/2020 CLINICAL DATA:  Stroke follow-up. EXAM: CT HEAD WITHOUT CONTRAST TECHNIQUE: Contiguous axial images were obtained from the base of the skull through the vertex without intravenous contrast. COMPARISON:  September 12, 2020 FINDINGS: Brain: There is a chronic right MCA infarct,  stable in appearance from the study 1 day ago. No evidence of intracranial hemorrhage, hydrocephalus, mass lesions or mass effect. Moderate brain parenchymal volume loss and deep white matter microangiopathy. Vascular: Calcific atherosclerotic disease of the intracranial arteries. Skull: Normal. Negative for fracture or focal lesion. Sinuses/Orbits: No acute finding. Other: Partially visualized apparent subcutaneous hematoma of the upper posterior neck. IMPRESSION: 1. Chronic stable right MCA infarct. 2. Moderate brain parenchymal atrophy and chronic microvascular disease. 3. Partially visualized apparent subcutaneous hematoma of the upper posterior neck. Electronically Signed   By: Fidela Salisbury M.D.   On: 09/13/2020 19:48   MR Brain Wo Contrast (neuro protocol)  Result Date: 09/12/2020 CLINICAL DATA:  Stroke follow-up. EXAM: MRI HEAD WITHOUT CONTRAST TECHNIQUE: Multiplanar, multiecho pulse sequences of the brain and surrounding structures were obtained without intravenous contrast. COMPARISON:  Head CT 09/12/2020 FINDINGS: The examination could not be completed due to the patient's condition. Only incomplete axial diffusion imaging was obtained (specifically only the B0 acquisition). This does not allow assessment for an acute infarct. A chronic right MCA infarct is again noted involving the frontal lobe and insula with associated dystrophic calcification. T2 hyperintensities elsewhere in the cerebral white matter bilaterally are incompletely evaluated and nonspecific but may reflect moderate chronic small vessel ischemic disease. There is age advanced cerebral atrophy. There is no significant intracranial mass effect or sizable extra-axial fluid collection. IMPRESSION: 1. Incomplete examination consisting of only a partial DWI acquisition. This does not allow assessment for an acute infarct. 2. Chronic right MCA infarct. Electronically Signed   By: Logan Bores M.D.   On: 09/12/2020 17:46   DG Chest  Port 1 View  Result Date: 09/12/2020 CLINICAL DATA:  Dialysis. EXAM: PORTABLE CHEST 1 VIEW COMPARISON:  Chest x-ray 12/04/2017. FINDINGS: Dialysis catheter stable position. Heart size stable. No pulmonary venous congestion. No focal infiltrate. No pleural effusion or pneumothorax. Old left rib fractures noted. No pneumothorax. IMPRESSION: 1. Dialysis catheter stable position. 2. No acute cardiopulmonary disease. Electronically Signed   By: Marcello Moores  Register   On: 09/12/2020 11:55   ECHOCARDIOGRAM COMPLETE  Result Date: 09/13/2020    ECHOCARDIOGRAM REPORT   Patient Name:   DERRYL UHER Ostrovsky Date of Exam: 09/13/2020 Medical Rec #:  [id number]       Height:  71.0 in Accession #:    [id number]      Weight:       144.4 lb Date of Birth:  10-21-1960       BSA:          1.836 m Patient Age:    59 years        BP:           163/57 mmHg Patient Gender: M               HR:           62 bpm. Exam Location:  Inpatient Procedure: 2D Echo, Cardiac Doppler and Color Doppler Indications:    CVA  History:        Patient has no prior history of Echocardiogram examinations.                 PAD. [place].  Sonographer:    Merrie Roof RDCS Referring Phys: [place]  1. Left ventricular ejection fraction, by estimation, is 40 to 45%. The left ventricle has mildly decreased function. The left ventricle demonstrates global hypokinesis. There is moderate concentric left ventricular hypertrophy. Left ventricular diastolic parameters are consistent with Grade II diastolic dysfunction (pseudonormalization). Elevated left atrial pressure.  2. Right ventricular systolic function is mildly reduced. The right ventricular size is normal. Tricuspid regurgitation signal is inadequate for assessing PA pressure.  3. Left atrial size was mildly dilated.  4. Right atrial size was mildly dilated.  5. The mitral valve is normal in structure. Trivial mitral valve regurgitation.  6. The aortic valve is tricuspid. There is mild  calcification of the aortic valve. There is mild thickening of the aortic valve. Aortic valve regurgitation is not visualized. Mild aortic valve sclerosis is present, with no evidence of aortic valve stenosis.  7. Aortic dilatation noted. There is mild dilatation of the ascending aorta, measuring 40 mm.  8. The inferior vena cava is normal in size with <50% respiratory variability, suggesting right atrial pressure of 8 mmHg. FINDINGS  Left Ventricle: Left ventricular ejection fraction, by estimation, is 40 to 45%. The left ventricle has mildly decreased function. The left ventricle demonstrates global hypokinesis. The left ventricular internal cavity size was normal in size. There is  moderate concentric left ventricular hypertrophy. Left ventricular diastolic parameters are consistent with Grade II diastolic dysfunction (pseudonormalization). Elevated left atrial pressure. Right Ventricle: The right ventricular size is normal. No increase in right ventricular wall thickness. Right ventricular systolic function is mildly reduced. Tricuspid regurgitation signal is inadequate for assessing PA pressure. Left Atrium: Left atrial size was mildly dilated. Right Atrium: Right atrial size was mildly dilated. Pericardium: There is no evidence of pericardial effusion. Mitral Valve: The mitral valve is normal in structure. Mild to moderate mitral annular calcification. Trivial mitral valve regurgitation. Tricuspid Valve: The tricuspid valve is normal in structure. Tricuspid valve regurgitation is not demonstrated. Aortic Valve: The aortic valve is tricuspid. There is mild calcification of the aortic valve. There is mild thickening of the aortic valve. Aortic valve regurgitation is not visualized. Mild aortic valve sclerosis is present, with no evidence of aortic valve stenosis. Pulmonic Valve: The pulmonic valve was normal in structure. Pulmonic valve regurgitation is not visualized. Aorta: Aortic dilatation noted. There is  mild dilatation of the ascending aorta, measuring 40 mm. Venous: The inferior vena cava is normal in size with less than 50% respiratory variability, suggesting right atrial pressure of 8 mmHg. IAS/Shunts: No atrial level shunt  detected by color flow Doppler.  LEFT VENTRICLE PLAX 2D LVIDd:         4.60 cm      Diastology LVIDs:         3.70 cm      LV e' medial:    5.11 cm/s LV PW:         1.50 cm      LV E/e' medial:  22.9 LV IVS:        104.00 cm    LV e' lateral:   10.30 cm/s LVOT diam:     2.20 cm      LV E/e' lateral: 11.4 LVOT Area:     3.80 cm  LV Volumes (MOD) LV vol d, MOD A4C: 193.0 ml LV vol s, MOD A4C: 104.0 ml LV SV MOD A4C:     193.0 ml RIGHT VENTRICLE RV Basal diam:  4.50 cm RV Mid diam:    3.80 cm LEFT ATRIUM              Index        RIGHT ATRIUM           Index LA diam:        4.20 cm  2.29 cm/m   RA Area:     19.80 cm LA Vol (A2C):   199.0 ml 108.39 ml/m RA Volume:   51.40 ml  28.00 ml/m LA Vol (A4C):   98.6 ml  53.70 ml/m LA Biplane Vol: 145.0 ml 78.98 ml/m   AORTA Ao Root diam: 3.60 cm Ao Asc diam:  4.00 cm MITRAL VALVE MV Area (PHT): 3.63 cm     SHUNTS MV Decel Time: 209 msec     Systemic Diam: 2.20 cm MV E velocity: 117.00 cm/s MV A velocity: 104.00 cm/s MV E/A ratio:  1.12 Mihai Croitoru MD Electronically signed by Sanda Klein MD Signature Date/Time: 09/13/2020/4:24:03 PM    Final    CT HEAD CODE STROKE WO CONTRAST  Result Date: 09/12/2020 CLINICAL DATA:  Code stroke. EXAM: CT HEAD WITHOUT CONTRAST TECHNIQUE: Contiguous axial images were obtained from the base of the skull through the vertex without intravenous contrast. COMPARISON:  04/22/2017 FINDINGS: Brain: There is no acute intracranial hemorrhage. Chronic right frontal infarct with involvement of the operculum and adjacent insula. There is associated dystrophic calcification. Patchy and confluent areas of hypoattenuation in the supratentorial white matter are nonspecific but probably reflect moderate to advanced chronic  microvascular ischemic changes. Prominence of the ventricles and sulci reflects generalized parenchymal volume loss. These findings have progressed since 2018. There is a small area of lucency within the anterior right thalamus. No extra-axial collection. Vascular: No hyperdense vessel. Intracranial atherosclerotic calcification is present the skull base. Skull: Unremarkable. Sinuses/Orbits: No acute abnormality. Other: Mastoid air cells are clear. ASPECTS [place] Stroke Program Early CT Score) - Ganglionic level infarction (caudate, lentiform nuclei, internal capsule, insula, M1-M3 cortex): 7 - Supraganglionic infarction (M4-M6 cortex): 3 Total score (0-10 with 10 being normal): 10, accounting for chronic infarct. IMPRESSION: There is no acute intracranial hemorrhage or evidence of acute infarction. ASPECT score is 10. Age-indeterminate anterior right thalamic infarct. Progression of parenchymal volume loss and chronic microvascular ischemic changes since 2018. These results were communicated to Dr. Lorrin Goodell At 11:30 am on 09/12/2020 by text page via the [facility] messaging system. Electronically Signed   By: Macy Mis M.D.   On: 09/12/2020 11:44   VAS US CAROTID (at [facility] and [facility] only)  Result Date: 09/14/2020 Carotid Arterial Duplex  Study Indications:       Weakness and confusion. Risk Factors:      Diabetes, prior CVA (2018) Other Factors:     ESRD, on dialysis. Bilateral BKA. Seizure disorder. Comparison Study:  No prior study Performing Technologist: Sharion Dove RVS  Examination Guidelines: A complete evaluation includes B-mode imaging, spectral Doppler, color Doppler, and power Doppler as needed of all accessible portions of each vessel. Bilateral testing is considered an integral part of a complete examination. Limited examinations for reoccurring indications may be performed as noted.  Right Carotid Findings: +----------+--------+--------+--------+------------------+--------+           PSV  cm/sEDV cm/sStenosisPlaque DescriptionComments +----------+--------+--------+--------+------------------+--------+ CCA Prox  77      14                                         +----------+--------+--------+--------+------------------+--------+ CCA Distal60      17                                         +----------+--------+--------+--------+------------------+--------+ ICA Prox  60      13              heterogenous               +----------+--------+--------+--------+------------------+--------+ ICA Distal64      17                                         +----------+--------+--------+--------+------------------+--------+ ECA       53      14                                         +----------+--------+--------+--------+------------------+--------+ +----------+--------+-------+--------+-------------------+           PSV cm/sEDV cmsDescribeArm Pressure (mmHG) +----------+--------+-------+--------+-------------------+ [id number]                                         +----------+--------+-------+--------+-------------------+ +---------+--------+--+--------+-+ VertebralPSV cm/s52EDV cm/s7 +---------+--------+--+--------+-+  Left Carotid Findings: +----------+--------+--------+--------+------------------+--------+           PSV cm/sEDV cm/sStenosisPlaque DescriptionComments +----------+--------+--------+--------+------------------+--------+ CCA Prox  84      11                                         +----------+--------+--------+--------+------------------+--------+ CCA Distal86      16                                         +----------+--------+--------+--------+------------------+--------+ ICA Prox  63      16              heterogenous               +----------+--------+--------+--------+------------------+--------+ ICA Distal59      18                                          +----------+--------+--------+--------+------------------+--------+  ECA       76      11                                         +----------+--------+--------+--------+------------------+--------+ +----------+--------+--------+--------+-------------------+           PSV cm/sEDV cm/sDescribeArm Pressure (mmHG) +----------+--------+--------+--------+-------------------+ [id number]                                          +----------+--------+--------+--------+-------------------+ +---------+--------+--+--------+--+ VertebralPSV cm/s61EDV cm/s14 +---------+--------+--+--------+--+   Summary: Right Carotid: The extracranial vessels were near-normal with only minimal wall                thickening or plaque. Left Carotid: The extracranial vessels were near-normal with only minimal wall               thickening or plaque. Vertebrals:  Bilateral vertebral arteries demonstrate antegrade flow. Subclavians: Normal flow hemodynamics were seen in bilateral subclavian              arteries. *See table(s) above for measurements and observations.     Preliminary         The results of significant diagnostics from this hospitalization (including imaging, microbiology, ancillary and laboratory) are listed below for reference.     Microbiology: Recent Results (from the past 240 hour(s))  Resp Panel by RT-PCR (Flu A&B, Covid) Nasopharyngeal Swab     Status: None   Collection Time: 09/12/20  3:08 PM   Specimen: Nasopharyngeal Swab; Nasopharyngeal(NP) swabs in vial transport medium  Result Value Ref Range Status   SARS Coronavirus 2 by RT PCR NEGATIVE NEGATIVE Final    Comment: (NOTE) SARS-CoV-2 target nucleic acids are NOT DETECTED.  The SARS-CoV-2 RNA is generally detectable in upper respiratory specimens during the acute phase of infection. The lowest concentration of SARS-CoV-2 viral copies this assay can detect is 138 copies/mL. A negative result does not preclude  SARS-Cov-2 infection and should not be used as the sole basis for treatment or other patient management decisions. A negative result may occur with  improper specimen collection/handling, submission of specimen other than nasopharyngeal swab, presence of viral mutation(s) within the areas targeted by this assay, and inadequate number of viral copies(<138 copies/mL). A negative result must be combined with clinical observations, patient history, and epidemiological information. The expected result is Negative.  Fact Sheet for Patients:  EntrepreneurPulse.com.au  Fact Sheet for Healthcare Providers:  IncredibleEmployment.be  This test is no t yet approved or cleared by the [place] FDA and  has been authorized for detection and/or diagnosis of SARS-CoV-2 by FDA under an Emergency Use Authorization (EUA). This EUA will remain  in effect (meaning this test can be used) for the duration of the COVID-19 declaration under Section 564(b)(1) of the Act, 21 U.S.C.section 360bbb-3(b)(1), unless the authorization is terminated  or revoked sooner.       Influenza A by PCR NEGATIVE NEGATIVE Final   Influenza B by PCR NEGATIVE NEGATIVE Final    Comment: (NOTE) The Xpert Xpress SARS-CoV-2/FLU/RSV plus assay is intended as an aid in the diagnosis of influenza from Nasopharyngeal swab specimens and should not be used as a sole basis for treatment. Nasal washings and aspirates are unacceptable for Xpert Xpress SARS-CoV-2/FLU/RSV testing.  Fact Sheet for Patients: EntrepreneurPulse.com.au  Fact  Sheet for Healthcare Providers: IncredibleEmployment.be  This test is not yet approved or cleared by the [place] and has been authorized for detection and/or diagnosis of SARS-CoV-2 by FDA under an Emergency Use Authorization (EUA). This EUA will remain in effect (meaning this test can be used) for the duration of  the COVID-19 declaration under Section 564(b)(1) of the Act, 21 U.S.C. section 360bbb-3(b)(1), unless the authorization is terminated or revoked.  Performed at [facility], [place] [address] [place], [place] [address]      Labs: BNP (last 3 results) No results for input(s): BNP in the last 8760 hours. Basic Metabolic Panel: Recent Labs  Lab 09/12/20 1115 09/12/20 1119 09/13/20 0722 09/14/20 0310  NA 136 138 138 137  K 5.5* 5.7* 5.2* 4.0  CL 98 102 99 99  CO2 25  --  23 26  GLUCOSE 187* 178* 110* 130*  BUN 48* 47* 58* 33*  CREATININE 9.57* 9.60* 10.15* 6.65*  CALCIUM 9.1  --  8.9 8.5*  MG 1.9  --   --  1.8  PHOS 5.4*  --  5.8* 3.8   Liver Function Tests: Recent Labs  Lab 09/12/20 1115 09/13/20 0722 09/14/20 0310  AST 11*  --   --   ALT 8  --   --   ALKPHOS 73  --   --   BILITOT 0.6  --   --   PROT 7.3  --   --   ALBUMIN 3.0* 2.9* 2.6*   No results for input(s): LIPASE, AMYLASE in the last 168 hours. No results for input(s): AMMONIA in the last 168 hours. CBC: Recent Labs  Lab 09/12/20 1115 09/12/20 1119 09/13/20 0722 09/14/20 0310  WBC 15.7*  --  10.2 8.3  NEUTROABS 13.8*  --   --   --   HGB 8.8* 9.9* 8.1* 7.2*  HCT 29.6* 29.0* 26.3* 23.6*  MCV 89.4  --  86.8 86.4  PLT 134*  --  127* 107*   Cardiac Enzymes: No results for input(s): CKTOTAL, CKMB, CKMBINDEX, TROPONINI in the last 168 hours. BNP: Invalid input(s): POCBNP CBG: Recent Labs  Lab 09/13/20 1240 09/13/20 1709 09/13/20 2114 09/14/20 0621 09/14/20 1222  GLUCAP 86 156* 92 119* 142*   D-Dimer No results for input(s): DDIMER in the last 72 hours. Hgb A1c Recent Labs    09/13/20 0325  HGBA1C 6.7*   Lipid Profile Recent Labs    09/13/20 0325  CHOL 109  HDL 40*  LDLCALC 51  TRIG 90  CHOLHDL 2.7   Thyroid function studies No results for input(s): TSH, T4TOTAL, T3FREE, THYROIDAB in the last 72 hours.  Invalid input(s): FREET3 Anemia work up Recent Labs     09/14/20 1055  FERRITIN 776*  TIBC 127*  IRON 44*   Urinalysis    Component Value Date/Time   COLORURINE YELLOW (A) 12/04/2017 0240   APPEARANCEUR HAZY (A) 12/04/2017 0240   LABSPEC 1.011 12/04/2017 0240   PHURINE 6.0 12/04/2017 0240   GLUCOSEU >=500 (A) 12/04/2017 0240   HGBUR NEGATIVE 12/04/2017 0240   BILIRUBINUR NEGATIVE 12/04/2017 0240   KETONESUR NEGATIVE 12/04/2017 0240   PROTEINUR 100 (A) 12/04/2017 0240   NITRITE NEGATIVE 12/04/2017 0240   LEUKOCYTESUR NEGATIVE 12/04/2017 0240   Sepsis Labs Invalid input(s): PROCALCITONIN,  WBC,  LACTICIDVEN   Time coordinating discharge: 35 minutes  SIGNED:  Mercy Riding, MD  [facility] 09/14/2020, 5:08 PM  If 7PM-7AM, please contact night-coverage [email or web address]

## 2020-09-15 LAB — HEPATITIS B SURFACE ANTIBODY, QUANTITATIVE: Hep B S AB Quant (Post): 46.8 m[IU]/mL (ref >9.9)

## 2020-09-16 NOTE — Telephone Encounter (Signed)
Could one of you refax that form.

## 2020-09-17 NOTE — Telephone Encounter (Signed)
Done. TNP

## 2020-09-18 ENCOUNTER — Other Ambulatory Visit: Payer: Self-pay

## 2020-09-18 ENCOUNTER — Encounter: Payer: Self-pay | Admitting: Gastroenterology

## 2020-09-18 ENCOUNTER — Ambulatory Visit (INDEPENDENT_AMBULATORY_CARE_PROVIDER_SITE_OTHER): Payer: Medicare Other | Admitting: Gastroenterology

## 2020-09-18 VITALS — BP 174/74 | HR 64 | Temp 98.5°F | Ht 71.0 in | Wt 145.4 lb

## 2020-09-18 DIAGNOSIS — Z1211 Encounter for screening for malignant neoplasm of colon: Secondary | ICD-10-CM

## 2020-09-18 DIAGNOSIS — D649 Anemia, unspecified: Secondary | ICD-10-CM | POA: Diagnosis not present

## 2020-09-18 DIAGNOSIS — E538 Deficiency of other specified B group vitamins: Secondary | ICD-10-CM | POA: Diagnosis not present

## 2020-09-18 MED ORDER — NA SULFATE-K SULFATE-MG SULF 17.5-3.13-1.6 GM/177ML PO SOLN: 354.0000 mL | Freq: Once | ORAL | 0 refills | Status: AC

## 2020-09-18 NOTE — Progress Notes (Signed)
Cephas Darby, MD [address]  [place]  [place], [place] [address]  Main: [phone]  Fax: [phone]    Gastroenterology Consultation  Referring Provider:     [name], MD Primary Care Physician:  [name], MD Primary Gastroenterologist:  Dr. Cephas Darby Reason for Consultation:     ?  Crohn's disease        HPI:   [name] is a 59 y.o. male referred by Dr. [name], MD  for consultation & management of possible Crohn's.  Patient has history of diabetes complicated by peripheral artery disease, bilateral below-knee amputation, history of stroke with residual left arm weakness, end-stage renal disease on hemodialysis, hidradenitis suppurativa currently maintained on [name] weekly.  Patient was originally referred for screening colonoscopy and given the diagnosis of Crohn's disease, he was scheduled to be seen in the office today.  Apparently, patient reports that the diagnosis of Crohn's was not definite.  He had 2 colonoscopies in the past, no one mentioned about it.  He denies any GI symptoms.  He does have severe anemia of chronic kidney disease.  Ferritin levels are elevated.  Currently taking oral iron.  Patient is accompanied by his wife today.  She reports gradual loss of appetite, decreased p.o. intake.  Patient lost several pounds within last 1 to 2 years due to several medical problems  Patient does not smoke, never smoked, does not drink alcohol  NSAIDs: None  Antiplts/Anticoagulants/Anti thrombotics: None  GI Procedures: Colonoscopy more than 5 years ago He denies family history of GI malignancy First cousin with Crohn's disease  Past Medical History:  Diagnosis Date  . Crohn disease ([place])   . Diabetes mellitus without complication ([place])   . DVT of lower extremity (deep venous thrombosis) ([place]) 2016  . Empyema ([place]) 05/20/2017  . Encephalopathy 12/04/2017  . Hidradenitis suppurativa   . ICH (intracerebral hemorrhage)  ([place])   . Peritonitis ([place]) 04/21/2017  . Pyogenic arthritis of knee ([place]) 02/04/2016  . Renal disorder   . Sepsis ([place]) 01/12/2018    Past Surgical History:  Procedure Laterality Date  . ABDOMINAL SURGERY    . AMPUTATION FINGER Left 06/2019   [facility]  . ANGIOPLASTY Left    [facility]pop [facility] 04-11-2018  . BELOW KNEE LEG AMPUTATION Right 08/2017   [facility]  . DIALYSIS/PERMA CATHETER INSERTION N/A 12/09/2017   Procedure: DIALYSIS/PERMA CATHETER INSERTION;  Surgeon: Katha Cabal, MD;  Location: [place] [facility];  Service: Cardiovascular;  Laterality: N/A;  . DIALYSIS/PERMA CATHETER INSERTION N/A 12/12/2017   Procedure: DIALYSIS/PERMA CATHETER INSERTION;  Surgeon: Algernon Huxley, MD;  Location: [place] [facility];  Service: Cardiovascular;  Laterality: N/A;  . DIALYSIS/PERMA CATHETER REMOVAL Left 12/09/2017   Procedure: DIALYSIS/PERMA CATHETER REMOVAL;  Surgeon: Katha Cabal, MD;  Location: [place] [facility];  Service: Cardiovascular;  Laterality: Left;  . KNEE SURGERY Left 02/04/2016   [facility]  . LEG AMPUTATION THROUGH LOWER TIBIA AND FIBULA Left 06/22/2018   [facility]  . LOWER EXTREMITY ANGIOGRAPHY Right 08/08/2017   Procedure: Lower Extremity Angiography;  Surgeon: Algernon Huxley, MD;  Location: [place] [facility];  Service: Cardiovascular;  Laterality: Right;  . LOWER EXTREMITY ANGIOGRAPHY Right 08/22/2017   Procedure: Lower Extremity Angiography;  Surgeon: Algernon Huxley, MD;  Location: [place] [facility];  Service: Cardiovascular;  Laterality: Right;  . LOWER EXTREMITY INTERVENTION  08/08/2017   Procedure: LOWER EXTREMITY INTERVENTION;  Surgeon: Algernon Huxley, MD;  Location: [place] [facility];  Service: Cardiovascular;;  . LOWER EXTREMITY INTERVENTION  08/22/2017   Procedure: LOWER EXTREMITY INTERVENTION;  Surgeon: Algernon Huxley, MD;  Location: [place] [facility];  Service: Cardiovascular;;     Current Outpatient Medications:  .  acetaminophen  (TYLENOL) 500 MG tablet, Take 1,000 mg by mouth daily as needed for moderate pain or headache. , Disp: , Rfl:  .  Adalimumab ([name] PEN) 40 MG/0.4ML PNKT, Inject 40 mg into the muscle once a week. , Disp: , Rfl:  .  Alcohol Swabs PADS, Use as directed to check blood sugar three times daily for insulin dependent type 2 diabetes., Disp: 100 each, Rfl: 12 .  amLODipine (NORVASC) 10 MG tablet, TAKE 1 TABLET(10 MG) BY MOUTH DAILY AS NEEDED (Patient taking differently: Take 10 mg by mouth at bedtime. ), Disp: 90 tablet, Rfl: 1 .  atorvastatin (LIPITOR) 80 MG tablet, TAKE 1 TABLET(80 MG) BY MOUTH DAILY (Patient taking differently: Take 80 mg by mouth daily. ), Disp: 90 tablet, Rfl: 3 .  AURYXIA 1 GM 210 MG(Fe) tablet, Take 420 mg by mouth in the morning and at bedtime. , Disp: , Rfl:  .  Blood Glucose Monitoring Suppl (ONE TOUCH ULTRA 2) w/Device KIT, Use as directed to check blood sugar three times daily. E11.9, Disp: 1 each, Rfl: 0 .  carvedilol (COREG) 25 MG tablet, Take 1 tablet (25 mg total) by mouth 2 (two) times daily., Disp: 60 tablet, Rfl: 12 .  furosemide (LASIX) 80 MG tablet, TAKE 1 TABLET(80 MG) BY MOUTH TWICE DAILY (Patient taking differently: Take 80 mg by mouth 2 (two) times daily. ), Disp: 180 tablet, Rfl: 1 .  gabapentin (NEURONTIN) 300 MG capsule, Take 1 capsule (300 mg total) by mouth at bedtime., Disp: 90 capsule, Rfl: 1 .  insulin aspart (NOVOLOG) 100 UNIT/ML FlexPen, Inject 3 Units into the skin 3 (three) times daily with meals., Disp: , Rfl:  .  insulin glargine (LANTUS) 100 UNIT/ML injection, Inject 10 Units into the skin at bedtime. , Disp: , Rfl:  .  levETIRAcetam (KEPPRA) 500 MG tablet, Take 1 tablet (500 mg total) by mouth daily AND 1 tablet (500 mg total) every Monday, Wednesday, and Friday. After dialysis., Disp: 110 tablet, Rfl: 1 .  lisinopril (ZESTRIL) 20 MG tablet, Take 20 mg by mouth daily. , Disp: , Rfl:  .  sevelamer carbonate (RENVELA) 800 MG tablet, Take 1 tablet (800  mg total) by mouth 3 (three) times daily. (Patient taking differently: Take 800 mg by mouth in the morning and at bedtime. ), Disp: 270 tablet, Rfl: 3   Family History  Problem Relation Age of Onset  . Irritable bowel syndrome Sister   . Diabetes Sister   . Heart disease Mother   . Diabetes Mother   . Heart disease Father   . Rheumatic fever Father        as child  . Psoriasis Brother   . Arthritis Brother   . Diabetes Sister   . Diabetes Sister      Social History   Tobacco Use  . Smoking status: Never Smoker  . Smokeless tobacco: Never Used  . Tobacco comment: smokes marijuana  Substance Use Topics  . Alcohol use: No  . Drug use: Yes    Types: Marijuana    Allergies as of 09/18/2020 - Review Complete 09/18/2020  Allergen Reaction Noted  . Methotrexate Other (See Comments) 11/06/2015  . Vancomycin Shortness  Of Breath 11/06/2015  . Cefepime Other (See Comments) 09/04/2017  . Tape  08/25/2015    Review of Systems:    All systems reviewed and negative except where noted in HPI.   Physical Exam:  BP (!) 174/74 (BP Location: Left Arm, Patient Position: Sitting, Cuff Size: Normal)   Pulse 64   Temp 98.5 F (36.9 C) (Oral)   Ht 5' 11" (1.803 m)   Wt 145 lb 6 oz (65.9 kg)   BMI 20.28 kg/m  No LMP for male patient.  General:   Alert, thin built, pleasant and cooperative in NAD Head:  Normocephalic and atraumatic. Eyes:  Sclera clear, no icterus.   Conjunctiva pale. Ears:  Normal auditory acuity. Nose:  No deformity, discharge, or lesions. Mouth:  No deformity or lesions,oropharynx pink & moist. Neck:  Supple; no masses or thyromegaly. Lungs:  Respirations even and unlabored.  Clear throughout to auscultation.   No wheezes, crackles, or rhonchi. No acute distress. Heart:  Regular rate and rhythm; no murmurs, clicks, rubs, or gallops. Abdomen:  Normal bowel sounds. Soft, non-tender and non-distended without masses, hepatosplenomegaly or hernias noted.  No guarding  or rebound tenderness.   Rectal: Not performed Msk: Bilateral below-knee amputation with prosthesis in place Pulses:  Normal pulses noted. Extremities:  No clubbing or edema.  No cyanosis. Neurologic:  Alert and oriented x3; left upper extremity weakness Skin:  Intact without significant lesions or rashes. No jaundice. Lymph Nodes:  No significant cervical adenopathy. Psych:  Alert and cooperative. Normal mood and affect.  Imaging Studies: No recent abdominal imaging  Assessment and Plan:   Dorwin B Eke is a 59 y.o. African-American male with history of diabetes on insulin complicated by end-stage renal disease on hemodialysis, cerebrovascular accident with residual left-sided weakness, peripheral artery disease, bilateral below-knee amputation with prosthesis, hidradenitis suppurativa currently maintained on weekly [name]  ?  Crohn's Recommend colonoscopy with TI evaluation  Colon cancer screening Recommend colonoscopy  Normocytic anemia Patient appears pale, recent hemoglobin is 7.1 on 09/14/2020 when he was admitted to the hospital for seizure.  He denies signs and symptoms of active GI bleed, likely secondary to underlying CKD Ferritin levels are elevated.  Do not suspect iron deficiency Check B12 and folate panel Patient reports that he will have labs done during dialysis this week   I have discussed alternative options, risks & benefits,  which include, but are not limited to, bleeding, infection, perforation,respiratory complication & drug reaction.  The patient agrees with this plan & written consent will be obtained.      Follow up based on the colonoscopy results   Cephas Darby, MD

## 2020-09-19 LAB — B12 AND FOLATE PANEL
Folate: 4.9 ng/mL (ref >3.0)
Vitamin B-12: 205 pg/mL — ABNORMAL LOW (ref 232–1245)

## 2020-09-22 ENCOUNTER — Telehealth: Payer: Self-pay

## 2020-09-22 ENCOUNTER — Other Ambulatory Visit: Payer: Self-pay | Admitting: Family Medicine

## 2020-09-22 DIAGNOSIS — N186 End stage renal disease: Secondary | ICD-10-CM

## 2020-09-22 NOTE — Telephone Encounter (Signed)
Copied from [place] [id number]. Topic: Quick Communication - Home Health Verbal Orders >> Sep 22, 2020 12:21 PM Gillis Ends D wrote: Caller/Agency: [facility] Number: [phone] Requesting OT/PT/Skilled Nursing/Social Work/Speech Therapy: Nursing Frequency: RN resumption and evaluation

## 2020-09-23 ENCOUNTER — Telehealth: Payer: Self-pay

## 2020-09-23 MED ORDER — CYANOCOBALAMIN 1000 MCG/ML IJ SOLN: INTRAMUSCULAR | 0 refills | Status: DC

## 2020-09-23 NOTE — Telephone Encounter (Signed)
Patient wife verbalized understanding of results

## 2020-09-23 NOTE — Telephone Encounter (Signed)
Called and left a message for call back sent vitamin b12 to the pharmacy

## 2020-09-23 NOTE — Telephone Encounter (Signed)
-----   Message from Lin Landsman, MD sent at 09/22/2020  6:56 PM EST ----- Patient has B12 deficiency, recommend B12 injection 1061mcg weekly for 4 weeks followed by once a month for 3 months  RV

## 2020-09-26 ENCOUNTER — Other Ambulatory Visit
Admission: RE | Admit: 2020-09-26 | Discharge: 2020-09-26 | Disposition: A | Discharge status: Home / Self Care | Payer: Medicare Other | Source: Ambulatory Visit | Attending: Gastroenterology | Admitting: Gastroenterology

## 2020-09-26 ENCOUNTER — Other Ambulatory Visit: Payer: Self-pay

## 2020-09-26 DIAGNOSIS — Z20822 Contact with and (suspected) exposure to covid-19: Secondary | ICD-10-CM | POA: Diagnosis not present

## 2020-09-26 DIAGNOSIS — Z01812 Encounter for preprocedural laboratory examination: Secondary | ICD-10-CM | POA: Diagnosis present

## 2020-09-27 LAB — SARS CORONAVIRUS 2 (TAT 6-24 HRS): SARS Coronavirus 2: NEGATIVE

## 2020-09-30 ENCOUNTER — Other Ambulatory Visit: Payer: Self-pay

## 2020-09-30 ENCOUNTER — Telehealth: Payer: Self-pay

## 2020-09-30 ENCOUNTER — Encounter: Payer: Self-pay | Admitting: Anesthesiology

## 2020-09-30 ENCOUNTER — Encounter
Admission: RE | Disposition: A | Discharge status: Home / Self Care | Payer: Self-pay | Source: Home / Self Care | Attending: Gastroenterology

## 2020-09-30 ENCOUNTER — Ambulatory Visit
Admission: RE | Admit: 2020-09-30 | Discharge: 2020-09-30 | Disposition: A | Discharge status: Home / Self Care | Payer: Medicare Other | Attending: Gastroenterology | Admitting: Gastroenterology

## 2020-09-30 DIAGNOSIS — Z1211 Encounter for screening for malignant neoplasm of colon: Secondary | ICD-10-CM | POA: Insufficient documentation

## 2020-09-30 DIAGNOSIS — E119 Type 2 diabetes mellitus without complications: Secondary | ICD-10-CM | POA: Insufficient documentation

## 2020-09-30 DIAGNOSIS — Z538 Procedure and treatment not carried out for other reasons: Secondary | ICD-10-CM | POA: Insufficient documentation

## 2020-09-30 DIAGNOSIS — R1110 Vomiting, unspecified: Secondary | ICD-10-CM | POA: Diagnosis not present

## 2020-09-30 LAB — GLUCOSE, CAPILLARY: Glucose-Capillary: 138 mg/dL — ABNORMAL HIGH (ref 70–99)

## 2020-09-30 SURGERY — COLONOSCOPY WITH PROPOFOL
Anesthesia: General

## 2020-09-30 MED ORDER — MAGNESIUM CITRATE PO SOLN: 1.0000 | Freq: Once | ORAL | 0 refills | Status: AC

## 2020-09-30 MED ORDER — GOLYTELY 236 G PO SOLR: 4000.0000 mL | Freq: Once | ORAL | 0 refills | Status: AC

## 2020-09-30 NOTE — Progress Notes (Signed)
Patient was throwing up before procedure and not completely cleaned out. The team decided it was best to postpone procedure

## 2020-09-30 NOTE — Telephone Encounter (Signed)
Called and talk to patient wife and scheduled patient for 10/28/2020. Informed her that his covid test would be 10/24/2020. Explained instructions for the 2 day prep and mailed them to patient and released to mychart. Updated referral and sent prep to the pharmacy

## 2020-09-30 NOTE — Telephone Encounter (Signed)
-----   Message from Lin Landsman, MD sent at 09/30/2020  8:45 AM EST ----- Regarding: reschedule colonoscopy He was actively vomiting in pre-op, so colonoscopy cancelled  Will plan for colonoscopy in January 2022 with 2 day prep  RV

## 2020-09-30 NOTE — H&P (Addendum)
  Cephas Darby, MD [address]  [place]  [place], [place] [address]  Main: [phone]  Fax: [phone] Pager: [phone]  Patient was actively vomiting in the pre-op, clear liquid emesis Discussed about intubation in order to perform colonoscopy safely, patient doesn't wish to proceed with it at this time and we felt it's reasonable as the procedure is elective  Will plan for slow 2 day prep in January 2022      Sherri Sear, MD  09/30/2020, 8:30 AM

## 2020-10-12 ENCOUNTER — Encounter: Payer: Self-pay | Admitting: Family Medicine

## 2020-10-12 DIAGNOSIS — E538 Deficiency of other specified B group vitamins: Secondary | ICD-10-CM | POA: Insufficient documentation

## 2020-10-20 ENCOUNTER — Other Ambulatory Visit: Payer: Self-pay | Admitting: Family Medicine

## 2020-10-20 DIAGNOSIS — F32A Depression, unspecified: Secondary | ICD-10-CM

## 2020-10-24 ENCOUNTER — Other Ambulatory Visit
Admission: RE | Admit: 2020-10-24 | Discharge: 2020-10-24 | Disposition: A | Discharge status: Home / Self Care | Payer: Medicare Other | Source: Ambulatory Visit | Attending: Gastroenterology | Admitting: Gastroenterology

## 2020-10-24 DIAGNOSIS — Z01812 Encounter for preprocedural laboratory examination: Secondary | ICD-10-CM | POA: Diagnosis present

## 2020-10-24 DIAGNOSIS — Z20822 Contact with and (suspected) exposure to covid-19: Secondary | ICD-10-CM | POA: Insufficient documentation

## 2020-10-25 LAB — SARS CORONAVIRUS 2 (TAT 6-24 HRS): SARS Coronavirus 2: NEGATIVE

## 2020-10-27 ENCOUNTER — Encounter: Payer: Self-pay | Admitting: Gastroenterology

## 2020-10-28 ENCOUNTER — Other Ambulatory Visit: Payer: Self-pay

## 2020-10-28 ENCOUNTER — Ambulatory Visit: Payer: Medicare Other | Admitting: Registered Nurse

## 2020-10-28 ENCOUNTER — Ambulatory Visit
Admission: RE | Admit: 2020-10-28 | Discharge: 2020-10-28 | Disposition: A | Discharge status: Home / Self Care | Payer: Medicare Other | Attending: Gastroenterology | Admitting: Gastroenterology

## 2020-10-28 ENCOUNTER — Encounter
Admission: RE | Disposition: A | Discharge status: Home / Self Care | Payer: Self-pay | Source: Home / Self Care | Attending: Gastroenterology

## 2020-10-28 ENCOUNTER — Encounter: Payer: Self-pay | Admitting: Gastroenterology

## 2020-10-28 DIAGNOSIS — Z89511 Acquired absence of right leg below knee: Secondary | ICD-10-CM | POA: Insufficient documentation

## 2020-10-28 DIAGNOSIS — Z79899 Other long term (current) drug therapy: Secondary | ICD-10-CM | POA: Insufficient documentation

## 2020-10-28 DIAGNOSIS — Z8673 Personal history of transient ischemic attack (TIA), and cerebral infarction without residual deficits: Secondary | ICD-10-CM | POA: Diagnosis not present

## 2020-10-28 DIAGNOSIS — Z86718 Personal history of other venous thrombosis and embolism: Secondary | ICD-10-CM | POA: Insufficient documentation

## 2020-10-28 DIAGNOSIS — Z538 Procedure and treatment not carried out for other reasons: Secondary | ICD-10-CM | POA: Insufficient documentation

## 2020-10-28 DIAGNOSIS — Z794 Long term (current) use of insulin: Secondary | ICD-10-CM | POA: Insufficient documentation

## 2020-10-28 DIAGNOSIS — Z89022 Acquired absence of left finger(s): Secondary | ICD-10-CM | POA: Insufficient documentation

## 2020-10-28 DIAGNOSIS — Z1211 Encounter for screening for malignant neoplasm of colon: Secondary | ICD-10-CM | POA: Diagnosis not present

## 2020-10-28 HISTORY — DX: Cerebral infarction, unspecified: I63.9

## 2020-10-28 HISTORY — PX: COLONOSCOPY WITH PROPOFOL: SHX5780

## 2020-10-28 LAB — GLUCOSE, CAPILLARY: Glucose-Capillary: 112 mg/dL — ABNORMAL HIGH (ref 70–99)

## 2020-10-28 SURGERY — COLONOSCOPY WITH PROPOFOL
Anesthesia: General

## 2020-10-28 MED ORDER — PROPOFOL 500 MG/50ML IV EMUL
INTRAVENOUS | Status: AC
  Filled 2020-10-28: qty 50

## 2020-10-28 MED ORDER — SODIUM CHLORIDE 0.9 % IV SOLN: INTRAVENOUS | Status: DC

## 2020-10-28 MED ORDER — LIDOCAINE HCL (PF) 2 % IJ SOLN
INTRAMUSCULAR | Status: AC
  Filled 2020-10-28: qty 5

## 2020-10-28 NOTE — Anesthesia Preprocedure Evaluation (Signed)
Anesthesia Evaluation  Patient identified by MRN, date of birth, ID band Patient awake    Reviewed: Allergy & Precautions, NPO status , Patient's Chart, lab work & pertinent test results  History of Anesthesia Complications Negative for: history of anesthetic complications  Airway Mallampati: II       Dental   Pulmonary neg sleep apnea, neg COPD, Patient abstained from smoking.Not current smoker,           Cardiovascular hypertension, Pt. on medications (-) Past MI and (-) CHF (-) dysrhythmias (-) Valvular Problems/Murmurs     Neuro/Psych Seizures - (with CVA x 1), Well Controlled,  CVA, No Residual Symptoms    GI/Hepatic Neg liver ROS, neg GERD  ,  Endo/Other  diabetes, Type 2, Oral Hypoglycemic Agents, Insulin Dependent  Renal/GU ESRF and DialysisRenal disease     Musculoskeletal   Abdominal   Peds  Hematology   Anesthesia Other Findings   Reproductive/Obstetrics                            Anesthesia Physical Anesthesia Plan  ASA: III  Anesthesia Plan: General   Post-op Pain Management:    Induction: Intravenous  PONV Risk Score and Plan: 2 and Propofol infusion and TIVA  Airway Management Planned: Nasal Cannula  Additional Equipment:   Intra-op Plan:   Post-operative Plan:   Informed Consent: I have reviewed the patients History and Physical, chart, labs and discussed the procedure including the risks, benefits and alternatives for the proposed anesthesia with the patient or authorized representative who has indicated his/her understanding and acceptance.       Plan Discussed with:   Anesthesia Plan Comments:         Anesthesia Quick Evaluation

## 2020-10-28 NOTE — Op Note (Signed)
[facility] Gastroenterology Patient Name: [name] Procedure Date: 10/28/2020 10:04 AM MRN: [id number] Account #: [id number] Date of Birth: June 28, 1961 Admit Type: Outpatient Age: 60 Room: [facility] ENDO ROOM 3 Gender: Male Note Status: Finalized Procedure:             Colonoscopy Indications:           Screening for colorectal malignant neoplasm, This is                         the patient's first colonoscopy Providers:             Lin Landsman MD, MD Referring MD:          [name]. Caryn Section, MD (Referring MD) Medicines:             General Anesthesia Complications:         No immediate complications. Estimated blood loss: None. Procedure:             Pre-Anesthesia Assessment:                        - Prior to the procedure, a History and Physical was                         performed, and patient medications and allergies were                         reviewed. The patient is competent. The risks and                         benefits of the procedure and the sedation options and                         risks were discussed with the patient. All questions                         were answered and informed consent was obtained.                         Patient identification and proposed procedure were                         verified by the physician, the nurse, the                         anesthesiologist, the anesthetist and the technician                         in the pre-procedure area in the procedure room in the                         endoscopy suite. Mental Status Examination: alert and                         oriented. Airway Examination: normal oropharyngeal                         airway and neck mobility. Respiratory Examination:  clear to auscultation. CV Examination: normal.                         Prophylactic Antibiotics: The patient does not require                         prophylactic antibiotics. Prior Anticoagulants:  The                         patient has taken no previous anticoagulant or                         antiplatelet agents. ASA Grade Assessment: III - A                         patient with severe systemic disease. After reviewing                         the risks and benefits, the patient was deemed in                         satisfactory condition to undergo the procedure. The                         anesthesia plan was to use general anesthesia.                         Immediately prior to administration of medications,                         the patient was re-assessed for adequacy to receive                         sedatives. The heart rate, respiratory rate, oxygen                         saturations, blood pressure, adequacy of pulmonary                         ventilation, and response to care were monitored                         throughout the procedure. The physical status of the                         patient was re-assessed after the procedure.                        After obtaining informed consent, the colonoscope was                         passed under direct vision. Throughout the procedure,                         the patient's blood pressure, pulse, and oxygen                         saturations were monitored continuously. The  Colonoscope was introduced through the anus with the                         intention of advancing to the cecum. The scope was                         advanced to the rectum before the procedure was                         aborted. Medications were given. The colonoscopy was                         extremely difficult due to poor bowel prep. Successful                         completion of the procedure was aided by aborted                         procedure. Findings:      The perianal and digital rectal examinations were normal. Pertinent       negatives include normal sphincter tone and no palpable rectal lesions.       Copious quantities of semi-solid stool was found in the rectum,       precluding visualization. Impression:            - Stool in the rectum.                        - No specimens collected. Recommendation:        - Discharge patient to home (with spouse).                        - Clear liquid diet today.                        - Repeat colonoscopy tomorrow because the bowel                         preparation was poor. Procedure Code(s):     --- Professional ---                        D6222, 53, Colorectal cancer screening; colonoscopy on                         individual not meeting criteria for high risk Diagnosis Code(s):     --- Professional ---                        Z12.11, Encounter for screening for malignant neoplasm                         of colon CPT copyright 2019 [employer]. All rights reserved. The codes documented in this report are preliminary and upon coder review may  be revised to meet current compliance requirements. Dr. Ulyess Mort Lin Landsman MD, MD 10/28/2020 10:17:34 AM This report has been signed electronically. Number of Addenda: 0 Note Initiated On: 10/28/2020 10:04 AM Total Procedure Duration: 0 hours 1 minute 54 seconds  Estimated Blood Loss:  Estimated blood  loss: none.      [facility]

## 2020-10-28 NOTE — Transfer of Care (Signed)
Immediate Anesthesia Transfer of Care Note  Patient: [name]  Procedure(s) Performed: Procedure(s): COLONOSCOPY WITH PROPOFOL (N/A)  Patient Location: PACU and Endoscopy Unit  Anesthesia Type:General  Level of Consciousness: sedated  Airway & Oxygen Therapy: Patient Spontanous Breathing and Patient connected to nasal cannula oxygen  Post-op Assessment: Report given to RN and Post -op Vital signs reviewed and stable  Post vital signs: Reviewed and stable  Last Vitals:  Vitals:   10/28/20 0937 10/28/20 1020  BP: (!) 183/78 (!) 142/56  Pulse: (!) 57 (!) 57  Resp: 20 15  Temp: 36.7 C 36.6 C  SpO2: 375% 436%    Complications: No apparent anesthesia complications

## 2020-10-28 NOTE — Anesthesia Postprocedure Evaluation (Signed)
Anesthesia Post Note  Patient: [name]  Procedure(s) Performed: COLONOSCOPY WITH PROPOFOL (N/A )  Patient location during evaluation: Endoscopy Anesthesia Type: General Level of consciousness: awake and alert Pain management: pain level controlled Vital Signs Assessment: post-procedure vital signs reviewed and stable Respiratory status: spontaneous breathing and respiratory function stable Cardiovascular status: stable Anesthetic complications: no   No complications documented.   Last Vitals:  Vitals:   10/28/20 1040 10/28/20 1050  BP:    Pulse: 60   Resp:    Temp:    SpO2: 99% 100%    Last Pain:  Vitals:   10/28/20 1050  TempSrc:   PainSc: 0-No pain                 [name]

## 2020-10-28 NOTE — Brief Op Note (Signed)
Procedure aborted. Poor prep

## 2020-10-28 NOTE — Anesthesia Procedure Notes (Signed)
Date/Time: 10/28/2020 10:08 AM Performed by: Doreen Salvage, CRNA Pre-anesthesia Checklist: Patient identified, Emergency Drugs available, Suction available and Patient being monitored Patient Re-evaluated:Patient Re-evaluated prior to induction Oxygen Delivery Method: Nasal cannula Induction Type: IV induction Dental Injury: Teeth and Oropharynx as per pre-operative assessment  Comments: Nasal cannula with etCO2 monitoring

## 2020-10-28 NOTE — H&P (Signed)
[name] Darby, MD [address]  [place]  [place], [place] [address]  Main: [phone]  Fax: [phone] Pager: [phone]  Primary Care Physician:  [name], MD Primary Gastroenterologist:  Dr. Cephas Brady  Pre-Procedure History & Physical: HPI:  [name] is a 60 y.o. male is here for an colonoscopy.   Past Medical History:  Diagnosis Date  . Crohn disease ([place])   . Diabetes mellitus without complication ([place])   . DVT of lower extremity (deep venous thrombosis) ([place]) 2016  . Empyema ([place]) 05/20/2017  . Encephalopathy 12/04/2017  . Fall at home, initial encounter 09/12/2020  . Hidradenitis suppurativa   . Hypertension   . ICH (intracerebral hemorrhage) ([place])   . Peritonitis ([place]) 04/21/2017  . Pyogenic arthritis of knee ([place]) 02/04/2016  . Renal disorder   . Sepsis ([place]) 01/12/2018  . Stroke [facility])     Past Surgical History:  Procedure Laterality Date  . ABDOMINAL SURGERY    . AMPUTATION FINGER Left 06/2019   [facility]  . ANGIOPLASTY Left    left fem-pop at [facility] 04-11-2018  . BELOW KNEE LEG AMPUTATION Right 08/2017   [facility]  . COLONOSCOPY    . DIALYSIS/PERMA CATHETER INSERTION N/A 12/09/2017   Procedure: DIALYSIS/PERMA CATHETER INSERTION;  Surgeon: Katha Cabal, MD;  Location: [place] [facility];  Service: Cardiovascular;  Laterality: N/A;  . DIALYSIS/PERMA CATHETER INSERTION N/A 12/12/2017   Procedure: DIALYSIS/PERMA CATHETER INSERTION;  Surgeon: Algernon Huxley, MD;  Location: [place] [facility];  Service: Cardiovascular;  Laterality: N/A;  . DIALYSIS/PERMA CATHETER REMOVAL Left 12/09/2017   Procedure: DIALYSIS/PERMA CATHETER REMOVAL;  Surgeon: Katha Cabal, MD;  Location: [place] [facility];  Service: Cardiovascular;  Laterality: Left;  . KNEE SURGERY Left 02/04/2016   [facility]  . LEG AMPUTATION THROUGH LOWER TIBIA AND FIBULA Left 06/22/2018   [facility]  . LOWER EXTREMITY ANGIOGRAPHY Right 08/08/2017    Procedure: Lower Extremity Angiography;  Surgeon: Algernon Huxley, MD;  Location: [place] [facility];  Service: Cardiovascular;  Laterality: Right;  . LOWER EXTREMITY ANGIOGRAPHY Right 08/22/2017   Procedure: Lower Extremity Angiography;  Surgeon: Algernon Huxley, MD;  Location: [place] [facility];  Service: Cardiovascular;  Laterality: Right;  . LOWER EXTREMITY INTERVENTION  08/08/2017   Procedure: LOWER EXTREMITY INTERVENTION;  Surgeon: Algernon Huxley, MD;  Location: [place] [facility];  Service: Cardiovascular;;  . LOWER EXTREMITY INTERVENTION  08/22/2017   Procedure: LOWER EXTREMITY INTERVENTION;  Surgeon: Algernon Huxley, MD;  Location: [place] [facility];  Service: Cardiovascular;;    Prior to Admission medications   Medication Sig Start Date End Date Taking? Authorizing Provider  acetaminophen (TYLENOL) 500 MG tablet Take 1,000 mg by mouth daily as needed for moderate pain or headache.    Yes [provider]  Adalimumab ([name] PEN) 40 MG/0.4ML PNKT Inject 40 mg into the muscle once a week.  11/27/18  Yes [provider]  Alcohol Swabs PADS Use as directed to check blood sugar three times daily for insulin dependent type 2 diabetes. 10/20/17   [name], MD  amLODipine (NORVASC) 10 MG tablet TAKE 1 TABLET(10 MG) BY MOUTH DAILY AS NEEDED Patient taking differently: Take 10 mg by mouth at bedtime. 04/23/20   [name], MD  atorvastatin (LIPITOR) 80 MG tablet TAKE 1 TABLET(80 MG) BY MOUTH DAILY Patient taking differently: Take 80 mg by mouth daily. 06/25/20   [name], MD  AURYXIA 1 GM 210 MG(Fe) tablet Take 420 mg by mouth in the morning and at bedtime.  10/23/18   [provider]  Blood Glucose Monitoring Suppl (ONE TOUCH ULTRA 2) w/Device KIT Use as directed to check blood sugar three times daily. E11.9 02/20/18   [name], MD  carvedilol (COREG) 25 MG tablet Take 1 tablet (25 mg total) by mouth 2 (two) times daily. 06/27/20   [name], MD   cyanocobalamin (,VITAMIN B-12,) 1000 MCG/ML injection Inject 34m once a week for 4 weeks and then once a month for 3 months 09/23/20   VLin Landsman MD  furosemide (LASIX) 80 MG tablet TAKE 1 TABLET(80 MG) BY MOUTH TWICE DAILY Patient taking differently: Take 80 mg by mouth 2 (two) times daily. 05/02/20   FBirdie Sons MD  gabapentin (NEURONTIN) 300 MG capsule Take 1 capsule (300 mg total) by mouth at bedtime. 09/14/20   GMercy Riding MD  insulin aspart (NOVOLOG) 100 UNIT/ML FlexPen Inject 3 Units into the skin 3 (three) times daily with meals.    [provider]  insulin glargine (LANTUS) 100 UNIT/ML injection Inject 10 Units into the skin at bedtime.  12/25/18   [provider]  levETIRAcetam (KEPPRA) 500 MG tablet Take 1 tablet (500 mg total) by mouth daily AND 1 tablet (500 mg total) every Monday, Wednesday, and Friday. After dialysis. 09/14/20   GMercy Riding MD  lisinopril (ZESTRIL) 20 MG tablet Take 20 mg by mouth daily.  09/08/20 09/08/21  [provider]  sevelamer carbonate (RENVELA) 800 MG tablet TAKE 1 TABLET(800 MG) BY MOUTH THREE TIMES DAILY 09/22/20   FBirdie Sons MD    Allergies as of 09/30/2020 - Review Complete 09/30/2020  Allergen Reaction Noted  . Methotrexate Other (See Comments) 11/06/2015  . Vancomycin Shortness Of Breath 11/06/2015  . Cefepime Other (See Comments) 09/04/2017  . Tape  08/25/2015    Family History  Problem Relation Age of Onset  . Irritable bowel syndrome Sister   . Diabetes Sister   . Heart disease Mother   . Diabetes Mother   . Heart disease Father   . Rheumatic fever Father        as child  . Psoriasis Brother   . Arthritis Brother   . Diabetes Sister   . Diabetes Sister     Social History   Socioeconomic History  . Marital status: Married    Spouse name: Not on file  . Number of children: 2  . Years of education: Not on file  . Highest education level: Associate degree: occupational,  tHotel manager or vocational program  Occupational History  . Occupation: disable  Tobacco Use  . Smoking status: Never Smoker  . Smokeless tobacco: Never Used  . Tobacco comment: smokes marijuana  Vaping Use  . Vaping Use: Never used  Substance and Sexual Activity  . Alcohol use: No  . Drug use: Yes    Types: Marijuana  . Sexual activity: Not Currently  Other Topics Concern  . Not on file  Social History Narrative  . Not on file   Social Determinants of Health   Financial Resource Strain: Low Risk   . Difficulty of Paying Living Expenses: Not hard at all  Food Insecurity: No Food Insecurity  . Worried About RCharity fundraiserin the Last Year: Never true  . Ran Out of Food in the Last Year: Never true  Transportation Needs: No Transportation Needs  . Lack  of Transportation (Medical): No  . Lack of Transportation (Non-Medical): No  Physical Activity: Inactive  . Days of Exercise per Week: 0 days  . Minutes of Exercise per Session: 0 min  Stress: No Stress Concern Present  . Feeling of Stress : Only a little  Social Connections: Moderately Isolated  . Frequency of Communication with Friends and Family: Twice a week  . Frequency of Social Gatherings with Friends and Family: Three times a week  . Attends Religious Services: Never  . Active Member of Clubs or Organizations: No  . Attends Archivist Meetings: Never  . Marital Status: Married  Human resources officer Violence: Not At Risk  . Fear of Current or Ex-Partner: No  . Emotionally Abused: No  . Physically Abused: No  . Sexually Abused: No    Review of Systems: See HPI, otherwise negative ROS  Physical Exam: BP (!) 183/78   Pulse (!) 57   Temp 98 F (36.7 C) (Temporal)   Resp 20   Ht 5' 11" (1.803 m)   Wt 66.7 kg   SpO2 100%   BMI 20.50 kg/m  General:   Alert,  pleasant and cooperative in NAD Head:  Normocephalic and atraumatic. Neck:  Supple; no masses or thyromegaly. Lungs:  Clear throughout to  auscultation.    Heart:  Regular rate and rhythm. Abdomen:  Soft, nontender and nondistended. Normal bowel sounds, without guarding, and without rebound.   Neurologic:  Alert and  oriented x4;  grossly normal neurologically.  Impression/Plan: Lavon B Korson is here for an colonoscopy to be performed for colon cancer screening  Risks, benefits, limitations, and alternatives regarding  colonoscopy have been reviewed with the patient.  Questions have been answered.  All parties agreeable.   Sherri Sear, MD  10/28/2020, 9:52 AM

## 2020-10-29 ENCOUNTER — Encounter: Payer: Self-pay | Admitting: Gastroenterology

## 2020-10-29 ENCOUNTER — Encounter (INDEPENDENT_AMBULATORY_CARE_PROVIDER_SITE_OTHER): Payer: Medicare Other | Admitting: Family Medicine

## 2020-10-29 DIAGNOSIS — E1122 Type 2 diabetes mellitus with diabetic chronic kidney disease: Secondary | ICD-10-CM

## 2020-10-29 DIAGNOSIS — I872 Venous insufficiency (chronic) (peripheral): Secondary | ICD-10-CM

## 2020-10-29 DIAGNOSIS — G40909 Epilepsy, unspecified, not intractable, without status epilepticus: Secondary | ICD-10-CM

## 2020-10-29 DIAGNOSIS — E1151 Type 2 diabetes mellitus with diabetic peripheral angiopathy without gangrene: Secondary | ICD-10-CM

## 2020-10-29 DIAGNOSIS — I2510 Atherosclerotic heart disease of native coronary artery without angina pectoris: Secondary | ICD-10-CM

## 2020-10-29 DIAGNOSIS — I69354 Hemiplegia and hemiparesis following cerebral infarction affecting left non-dominant side: Secondary | ICD-10-CM | POA: Diagnosis not present

## 2020-10-29 DIAGNOSIS — E1140 Type 2 diabetes mellitus with diabetic neuropathy, unspecified: Secondary | ICD-10-CM | POA: Diagnosis not present

## 2020-10-29 DIAGNOSIS — N186 End stage renal disease: Secondary | ICD-10-CM

## 2020-10-29 DIAGNOSIS — I120 Hypertensive chronic kidney disease with stage 5 chronic kidney disease or end stage renal disease: Secondary | ICD-10-CM | POA: Diagnosis not present

## 2020-10-29 DIAGNOSIS — D631 Anemia in chronic kidney disease: Secondary | ICD-10-CM

## 2020-11-04 ENCOUNTER — Other Ambulatory Visit: Payer: Self-pay | Admitting: Family Medicine

## 2020-11-04 ENCOUNTER — Telehealth: Payer: Self-pay | Admitting: Family Medicine

## 2020-11-04 NOTE — Telephone Encounter (Signed)
Medication Refill - Medication: [name]   Has the patient contacted their pharmacy? Yes.   Pts wife states that the pharmacy is telling them they have sent over the request for this medication refill multiple times with no response. Please advise (Agent: If no, request that the patient contact the pharmacy for the refill.) (Agent: If yes, when and what did the pharmacy advise?)  Preferred Pharmacy (with phone number or street name):  [facility] [place], [address]  [place] [address]  Phone: [phone] Fax: [phone]  Hours: Not open 24 hours     Agent: Please be advised that RX refills may take up to 3 business days. We ask that you follow-up with your pharmacy.

## 2020-11-04 NOTE — Telephone Encounter (Signed)
Requested medication (s) are due for refill today - unsure  Requested medication (s) are on the active medication list -yes  Future visit scheduled -yes  Last refill: 10/23/18  Notes to clinic: Request RF of historical medication, not assigned protocol- sent for review   Requested Prescriptions  Pending Prescriptions Disp Refills   AURYXIA 1 GM 210 MG(Fe) tablet 270 tablet     Sig: Take 2 tablets (420 mg total) by mouth in the morning and at bedtime.      Off-Protocol Failed - 11/04/2020  1:34 PM      Failed - Medication not assigned to a protocol, review manually.      Passed - Valid encounter within last 12 months    Recent Outpatient Visits           2 months ago Primary hypertension   [facility] [name], MD   5 months ago Type 2 diabetes mellitus with other diabetic kidney complication, with long-term current use of insulin ([place])   [facility] [name], MD   6 months ago Type 2 diabetes mellitus with other diabetic kidney complication, with long-term current use of insulin [facility])   [facility] [name], MD   10 months ago Type 2 diabetes mellitus with other diabetic kidney complication, with long-term current use of insulin [facility])   [facility] [name], MD   1 year ago Peripheral vascular disease [facility])   [place] [name], MD       Future Appointments             In 3 weeks [name], [name], MD [facility]                 Requested Prescriptions  Pending Prescriptions Disp Refills   AURYXIA 1 GM 210 MG(Fe) tablet 270 tablet     Sig: Take 2 tablets (420 mg total) by mouth in the morning and at bedtime.      Off-Protocol Failed - 11/04/2020  1:34 PM      Failed - Medication not assigned to a protocol, review manually.      Passed - Valid encounter within last 12 months    Recent Outpatient Visits           2 months ago  Primary hypertension   [facility] [name], MD   5 months ago Type 2 diabetes mellitus with other diabetic kidney complication, with long-term current use of insulin ([place])   [facility] [name], MD   6 months ago Type 2 diabetes mellitus with other diabetic kidney complication, with long-term current use of insulin [facility])   [facility] [name], MD   10 months ago Type 2 diabetes mellitus with other diabetic kidney complication, with long-term current use of insulin [facility])   [facility] [name], MD   1 year ago Peripheral vascular disease [facility])   [place], Donald E, MD       Future Appointments             In 3 weeks [name], [name], MD [facility]

## 2020-11-06 NOTE — Telephone Encounter (Signed)
This medication is for his kidneys and was prescribed by Dr. Holley Raring.Please advise they need to contact his nephrologist for prescription.

## 2020-11-11 ENCOUNTER — Other Ambulatory Visit: Payer: Self-pay | Admitting: Family Medicine

## 2020-11-13 ENCOUNTER — Telehealth: Payer: Self-pay

## 2020-11-13 NOTE — Telephone Encounter (Signed)
Copied from [place] [id number]. Topic: Quick Communication - Home Health Verbal Orders >> Nov 13, 2020 11:35 AM Gillis Ends D wrote: Caller/Agency: [facility] Number:[phone] Requesting OT/PT/Skilled Nursing/Social Work/Speech Therapy: Nursing evaluation Frequency: (The reason needed for medication and diease management and B12 injections.)

## 2020-11-14 NOTE — Telephone Encounter (Signed)
That's fine. Can see three times a week for lower extremity weakness, history of CVA, left sided weakness and end stage renal disease.

## 2020-11-17 NOTE — Telephone Encounter (Signed)
[name] as below.

## 2020-11-18 ENCOUNTER — Inpatient Hospital Stay
Admission: EM | Admit: 2020-11-18 | Discharge: 2020-11-25 | DRG: 377 | Disposition: A | Discharge status: Home Health | Payer: Medicare Other | Attending: Internal Medicine | Admitting: Internal Medicine

## 2020-11-18 ENCOUNTER — Emergency Department: Payer: Medicare Other

## 2020-11-18 ENCOUNTER — Other Ambulatory Visit: Payer: Self-pay

## 2020-11-18 ENCOUNTER — Encounter: Payer: Self-pay | Admitting: Emergency Medicine

## 2020-11-18 DIAGNOSIS — E1122 Type 2 diabetes mellitus with diabetic chronic kidney disease: Secondary | ICD-10-CM | POA: Diagnosis present

## 2020-11-18 DIAGNOSIS — D122 Benign neoplasm of ascending colon: Secondary | ICD-10-CM | POA: Diagnosis present

## 2020-11-18 DIAGNOSIS — R197 Diarrhea, unspecified: Secondary | ICD-10-CM | POA: Diagnosis not present

## 2020-11-18 DIAGNOSIS — E1151 Type 2 diabetes mellitus with diabetic peripheral angiopathy without gangrene: Secondary | ICD-10-CM | POA: Diagnosis present

## 2020-11-18 DIAGNOSIS — D120 Benign neoplasm of cecum: Secondary | ICD-10-CM | POA: Diagnosis present

## 2020-11-18 DIAGNOSIS — E11649 Type 2 diabetes mellitus with hypoglycemia without coma: Secondary | ICD-10-CM

## 2020-11-18 DIAGNOSIS — G40909 Epilepsy, unspecified, not intractable, without status epilepticus: Secondary | ICD-10-CM

## 2020-11-18 DIAGNOSIS — K449 Diaphragmatic hernia without obstruction or gangrene: Secondary | ICD-10-CM | POA: Diagnosis present

## 2020-11-18 DIAGNOSIS — D125 Benign neoplasm of sigmoid colon: Secondary | ICD-10-CM | POA: Diagnosis present

## 2020-11-18 DIAGNOSIS — I10 Essential (primary) hypertension: Secondary | ICD-10-CM | POA: Diagnosis present

## 2020-11-18 DIAGNOSIS — E538 Deficiency of other specified B group vitamins: Secondary | ICD-10-CM | POA: Diagnosis present

## 2020-11-18 DIAGNOSIS — Z881 Allergy status to other antibiotic agents status: Secondary | ICD-10-CM

## 2020-11-18 DIAGNOSIS — K5090 Crohn's disease, unspecified, without complications: Secondary | ICD-10-CM | POA: Diagnosis present

## 2020-11-18 DIAGNOSIS — E7800 Pure hypercholesterolemia, unspecified: Secondary | ICD-10-CM | POA: Diagnosis present

## 2020-11-18 DIAGNOSIS — Z79899 Other long term (current) drug therapy: Secondary | ICD-10-CM

## 2020-11-18 DIAGNOSIS — G9341 Metabolic encephalopathy: Secondary | ICD-10-CM | POA: Diagnosis present

## 2020-11-18 DIAGNOSIS — D124 Benign neoplasm of descending colon: Secondary | ICD-10-CM | POA: Diagnosis present

## 2020-11-18 DIAGNOSIS — N186 End stage renal disease: Secondary | ICD-10-CM | POA: Diagnosis present

## 2020-11-18 DIAGNOSIS — I5043 Acute on chronic combined systolic (congestive) and diastolic (congestive) heart failure: Secondary | ICD-10-CM | POA: Diagnosis present

## 2020-11-18 DIAGNOSIS — Z20822 Contact with and (suspected) exposure to covid-19: Secondary | ICD-10-CM | POA: Diagnosis present

## 2020-11-18 DIAGNOSIS — Z992 Dependence on renal dialysis: Secondary | ICD-10-CM

## 2020-11-18 DIAGNOSIS — K91840 Postprocedural hemorrhage and hematoma of a digestive system organ or structure following a digestive system procedure: Secondary | ICD-10-CM | POA: Diagnosis not present

## 2020-11-18 DIAGNOSIS — Z8673 Personal history of transient ischemic attack (TIA), and cerebral infarction without residual deficits: Secondary | ICD-10-CM

## 2020-11-18 DIAGNOSIS — Z89512 Acquired absence of left leg below knee: Secondary | ICD-10-CM

## 2020-11-18 DIAGNOSIS — R4182 Altered mental status, unspecified: Secondary | ICD-10-CM | POA: Diagnosis not present

## 2020-11-18 DIAGNOSIS — F1290 Cannabis use, unspecified, uncomplicated: Secondary | ICD-10-CM | POA: Diagnosis present

## 2020-11-18 DIAGNOSIS — K2980 Duodenitis without bleeding: Secondary | ICD-10-CM | POA: Diagnosis present

## 2020-11-18 DIAGNOSIS — Z794 Long term (current) use of insulin: Secondary | ICD-10-CM

## 2020-11-18 DIAGNOSIS — E785 Hyperlipidemia, unspecified: Secondary | ICD-10-CM | POA: Diagnosis present

## 2020-11-18 DIAGNOSIS — I132 Hypertensive heart and chronic kidney disease with heart failure and with stage 5 chronic kidney disease, or end stage renal disease: Secondary | ICD-10-CM | POA: Diagnosis present

## 2020-11-18 DIAGNOSIS — I5022 Chronic systolic (congestive) heart failure: Secondary | ICD-10-CM | POA: Diagnosis present

## 2020-11-18 DIAGNOSIS — Z8249 Family history of ischemic heart disease and other diseases of the circulatory system: Secondary | ICD-10-CM

## 2020-11-18 DIAGNOSIS — K921 Melena: Secondary | ICD-10-CM | POA: Diagnosis present

## 2020-11-18 DIAGNOSIS — D649 Anemia, unspecified: Secondary | ICD-10-CM | POA: Diagnosis present

## 2020-11-18 DIAGNOSIS — Z89511 Acquired absence of right leg below knee: Secondary | ICD-10-CM

## 2020-11-18 DIAGNOSIS — E1129 Type 2 diabetes mellitus with other diabetic kidney complication: Secondary | ICD-10-CM

## 2020-11-18 DIAGNOSIS — K50919 Crohn's disease, unspecified, with unspecified complications: Secondary | ICD-10-CM | POA: Diagnosis not present

## 2020-11-18 DIAGNOSIS — Z89022 Acquired absence of left finger(s): Secondary | ICD-10-CM

## 2020-11-18 DIAGNOSIS — K635 Polyp of colon: Secondary | ICD-10-CM | POA: Diagnosis not present

## 2020-11-18 DIAGNOSIS — F0150 Vascular dementia without behavioral disturbance: Secondary | ICD-10-CM | POA: Diagnosis present

## 2020-11-18 DIAGNOSIS — R410 Disorientation, unspecified: Secondary | ICD-10-CM | POA: Diagnosis not present

## 2020-11-18 DIAGNOSIS — D123 Benign neoplasm of transverse colon: Secondary | ICD-10-CM | POA: Diagnosis present

## 2020-11-18 DIAGNOSIS — K922 Gastrointestinal hemorrhage, unspecified: Secondary | ICD-10-CM | POA: Diagnosis not present

## 2020-11-18 DIAGNOSIS — R419 Unspecified symptoms and signs involving cognitive functions and awareness: Secondary | ICD-10-CM | POA: Diagnosis not present

## 2020-11-18 DIAGNOSIS — Z86718 Personal history of other venous thrombosis and embolism: Secondary | ICD-10-CM

## 2020-11-18 DIAGNOSIS — Z8261 Family history of arthritis: Secondary | ICD-10-CM

## 2020-11-18 DIAGNOSIS — Z833 Family history of diabetes mellitus: Secondary | ICD-10-CM

## 2020-11-18 DIAGNOSIS — Y838 Other surgical procedures as the cause of abnormal reaction of the patient, or of later complication, without mention of misadventure at the time of the procedure: Secondary | ICD-10-CM | POA: Diagnosis not present

## 2020-11-18 DIAGNOSIS — R509 Fever, unspecified: Secondary | ICD-10-CM

## 2020-11-18 DIAGNOSIS — N2581 Secondary hyperparathyroidism of renal origin: Secondary | ICD-10-CM | POA: Diagnosis present

## 2020-11-18 DIAGNOSIS — D631 Anemia in chronic kidney disease: Secondary | ICD-10-CM

## 2020-11-18 HISTORY — DX: Metabolic encephalopathy: G93.41

## 2020-11-18 LAB — BASIC METABOLIC PANEL
Anion gap: 16 — ABNORMAL HIGH (ref 5–15)
BUN: 30 mg/dL — ABNORMAL HIGH (ref 6–20)
CO2: 27 mmol/L (ref 22–32)
Calcium: 9.3 mg/dL (ref 8.9–10.3)
Chloride: 95 mmol/L — ABNORMAL LOW (ref 98–111)
Creatinine, Ser: 5.67 mg/dL — ABNORMAL HIGH (ref 0.61–1.24)
GFR, Estimated: 11 mL/min — ABNORMAL LOW (ref >60)
Glucose, Bld: 161 mg/dL — ABNORMAL HIGH (ref 70–99)
Potassium: 3.6 mmol/L (ref 3.5–5.1)
Sodium: 138 mmol/L (ref 135–145)

## 2020-11-18 LAB — SARS CORONAVIRUS 2 BY RT PCR (HOSPITAL ORDER, PERFORMED IN ~~LOC~~ HOSPITAL LAB): SARS Coronavirus 2: NEGATIVE

## 2020-11-18 LAB — CBC
HCT: 27.5 % — ABNORMAL LOW (ref 39.0–52.0)
HCT: 24.5 % — ABNORMAL LOW (ref 39.0–52.0)
HCT: 25.4 % — ABNORMAL LOW (ref 39.0–52.0)
Hemoglobin: 8.5 g/dL — ABNORMAL LOW (ref 13.0–17.0)
Hemoglobin: 7.5 g/dL — ABNORMAL LOW (ref 13.0–17.0)
Hemoglobin: 7.8 g/dL — ABNORMAL LOW (ref 13.0–17.0)
MCH: 26.2 pg (ref 26.0–34.0)
MCH: 26 pg (ref 26.0–34.0)
MCH: 26.3 pg (ref 26.0–34.0)
MCHC: 30.9 g/dL (ref 30.0–36.0)
MCHC: 30.6 g/dL (ref 30.0–36.0)
MCHC: 30.7 g/dL (ref 30.0–36.0)
MCV: 84.9 fL (ref 80.0–100.0)
MCV: 85.1 fL (ref 80.0–100.0)
MCV: 85.5 fL (ref 80.0–100.0)
Platelets: 128 10*3/uL — ABNORMAL LOW (ref 150–400)
Platelets: 111 10*3/uL — ABNORMAL LOW (ref 150–400)
Platelets: 108 10*3/uL — ABNORMAL LOW (ref 150–400)
RBC: 3.24 MIL/uL — ABNORMAL LOW (ref 4.22–5.81)
RBC: 2.88 MIL/uL — ABNORMAL LOW (ref 4.22–5.81)
RBC: 2.97 MIL/uL — ABNORMAL LOW (ref 4.22–5.81)
RDW: 20.4 % — ABNORMAL HIGH (ref 11.5–15.5)
RDW: 19.9 % — ABNORMAL HIGH (ref 11.5–15.5)
RDW: 19.9 % — ABNORMAL HIGH (ref 11.5–15.5)
WBC: 9.2 10*3/uL (ref 4.0–10.5)
WBC: 7.7 10*3/uL (ref 4.0–10.5)
WBC: 6.7 10*3/uL (ref 4.0–10.5)
nRBC: 0 % (ref 0.0–0.2)
nRBC: 0 % (ref 0.0–0.2)
nRBC: 0 % (ref 0.0–0.2)

## 2020-11-18 LAB — PROTIME-INR
INR: 1.1 (ref 0.8–1.2)
Prothrombin Time: 13.6 seconds (ref 11.4–15.2)

## 2020-11-18 LAB — GLUCOSE, CAPILLARY
Glucose-Capillary: 116 mg/dL — ABNORMAL HIGH (ref 70–99)
Glucose-Capillary: 105 mg/dL — ABNORMAL HIGH (ref 70–99)
Glucose-Capillary: 102 mg/dL — ABNORMAL HIGH (ref 70–99)

## 2020-11-18 LAB — APTT: aPTT: 35 seconds (ref 24–36)

## 2020-11-18 LAB — POC SARS CORONAVIRUS 2 AG -  ED: SARS Coronavirus 2 Ag: NEGATIVE

## 2020-11-18 LAB — LACTIC ACID, PLASMA: Lactic Acid, Venous: 1 mmol/L (ref 0.5–1.9)

## 2020-11-18 MED ORDER — ATORVASTATIN CALCIUM 20 MG PO TABS
80.0000 mg | ORAL_TABLET | Freq: Every day | ORAL | Status: DC
  Administered 2020-11-18 – 2020-11-24 (×7): 80 mg via ORAL
  Filled 2020-11-18 (×7): qty 4

## 2020-11-18 MED ORDER — AMLODIPINE BESYLATE 10 MG PO TABS
10.0000 mg | ORAL_TABLET | Freq: Every day | ORAL | Status: DC
  Administered 2020-11-18 – 2020-11-24 (×7): 10 mg via ORAL
  Filled 2020-11-18 (×7): qty 1

## 2020-11-18 MED ORDER — LISINOPRIL 20 MG PO TABS
20.0000 mg | ORAL_TABLET | Freq: Every day | ORAL | Status: DC
  Administered 2020-11-18 – 2020-11-25 (×6): 20 mg via ORAL
  Filled 2020-11-18 (×8): qty 1

## 2020-11-18 MED ORDER — LORAZEPAM 2 MG/ML IJ SOLN: 2.0000 mg | INTRAMUSCULAR | Status: DC | PRN

## 2020-11-18 MED ORDER — INSULIN GLARGINE 100 UNIT/ML ~~LOC~~ SOLN
7.0000 [IU] | Freq: Every day | SUBCUTANEOUS | Status: DC
  Filled 2020-11-18 (×4): qty 0.07

## 2020-11-18 MED ORDER — GABAPENTIN 300 MG PO CAPS
300.0000 mg | ORAL_CAPSULE | Freq: Every day | ORAL | Status: DC
  Administered 2020-11-18 – 2020-11-24 (×7): 300 mg via ORAL
  Filled 2020-11-18 (×7): qty 1

## 2020-11-18 MED ORDER — SEVELAMER CARBONATE 800 MG PO TABS
800.0000 mg | ORAL_TABLET | Freq: Three times a day (TID) | ORAL | Status: DC
  Administered 2020-11-20 – 2020-11-25 (×9): 800 mg via ORAL
  Filled 2020-11-18 (×11): qty 1

## 2020-11-18 MED ORDER — SODIUM CHLORIDE 0.9 % IV SOLN
8.0000 mg/h | INTRAVENOUS | Status: AC
  Administered 2020-11-18 – 2020-11-21 (×8): 8 mg/h via INTRAVENOUS
  Filled 2020-11-18 (×6): qty 80

## 2020-11-18 MED ORDER — PIPERACILLIN-TAZOBACTAM IN DEX 2-0.25 GM/50ML IV SOLN
2.2500 g | Freq: Three times a day (TID) | INTRAVENOUS | Status: DC
  Administered 2020-11-18 – 2020-11-21 (×9): 2.25 g via INTRAVENOUS
  Filled 2020-11-18 (×12): qty 50

## 2020-11-18 MED ORDER — INSULIN ASPART 100 UNIT/ML ~~LOC~~ SOLN
0.0000 [IU] | Freq: Three times a day (TID) | SUBCUTANEOUS | Status: DC
  Administered 2020-11-20 – 2020-11-22 (×3): 1 [IU] via SUBCUTANEOUS
  Administered 2020-11-22 – 2020-11-23 (×2): 2 [IU] via SUBCUTANEOUS
  Filled 2020-11-18 (×6): qty 1

## 2020-11-18 MED ORDER — ACETAMINOPHEN 325 MG PO TABS
650.0000 mg | ORAL_TABLET | Freq: Four times a day (QID) | ORAL | Status: DC | PRN
  Administered 2020-11-20: 650 mg via ORAL
  Filled 2020-11-18: qty 2

## 2020-11-18 MED ORDER — LEVOFLOXACIN IN D5W 750 MG/150ML IV SOLN
750.0000 mg | Freq: Once | INTRAVENOUS | Status: DC
  Administered 2020-11-18: 750 mg via INTRAVENOUS
  Filled 2020-11-18: qty 150

## 2020-11-18 MED ORDER — LEVETIRACETAM ER 500 MG PO TB24
500.0000 mg | ORAL_TABLET | Freq: Every day | ORAL | Status: DC
  Administered 2020-11-18: 500 mg via ORAL
  Filled 2020-11-18 (×2): qty 1

## 2020-11-18 MED ORDER — LEVETIRACETAM ER 500 MG PO TB24
500.0000 mg | ORAL_TABLET | ORAL | Status: DC
  Filled 2020-11-18: qty 1

## 2020-11-18 MED ORDER — FUROSEMIDE 40 MG PO TABS
80.0000 mg | ORAL_TABLET | Freq: Two times a day (BID) | ORAL | Status: DC
Start: 2020-11-19 — End: 2020-11-25
  Administered 2020-11-19 – 2020-11-25 (×10): 80 mg via ORAL
  Filled 2020-11-18 (×12): qty 2

## 2020-11-18 MED ORDER — ACETAMINOPHEN 500 MG PO TABS
1000.0000 mg | ORAL_TABLET | Freq: Once | ORAL | Status: AC
  Administered 2020-11-18: 1000 mg via ORAL
  Filled 2020-11-18: qty 2

## 2020-11-18 MED ORDER — PANTOPRAZOLE SODIUM 40 MG IV SOLR
40.0000 mg | Freq: Two times a day (BID) | INTRAVENOUS | Status: DC
  Administered 2020-11-22 – 2020-11-24 (×7): 40 mg via INTRAVENOUS
  Filled 2020-11-18 (×8): qty 40

## 2020-11-18 MED ORDER — FERRIC CITRATE 1 GM 210 MG(FE) PO TABS
420.0000 mg | ORAL_TABLET | Freq: Two times a day (BID) | ORAL | Status: DC
  Administered 2020-11-18 – 2020-11-24 (×11): 420 mg via ORAL
  Filled 2020-11-18 (×15): qty 2

## 2020-11-18 MED ORDER — SODIUM CHLORIDE 0.9 % IV SOLN
INTRAVENOUS | Status: DC | PRN
  Administered 2020-11-18 – 2020-11-20 (×4): 500 mL via INTRAVENOUS
  Administered 2020-11-21: 250 mL via INTRAVENOUS
  Administered 2020-11-21 (×2): 500 mL via INTRAVENOUS
  Administered 2020-11-22: 250 mL via INTRAVENOUS

## 2020-11-18 MED ORDER — SODIUM CHLORIDE 0.9 % IV SOLN
80.0000 mg | Freq: Once | INTRAVENOUS | Status: DC
  Filled 2020-11-18 (×2): qty 80

## 2020-11-18 MED ORDER — CARVEDILOL 25 MG PO TABS
25.0000 mg | ORAL_TABLET | Freq: Two times a day (BID) | ORAL | Status: DC
  Administered 2020-11-18 – 2020-11-25 (×11): 25 mg via ORAL
  Filled 2020-11-18 (×13): qty 1

## 2020-11-18 MED ORDER — INSULIN ASPART 100 UNIT/ML ~~LOC~~ SOLN: 0.0000 [IU] | Freq: Every day | SUBCUTANEOUS | Status: DC

## 2020-11-18 MED ORDER — PIPERACILLIN-TAZOBACTAM 3.375 G IVPB
3.3750 g | Freq: Once | INTRAVENOUS | Status: AC
  Administered 2020-11-18: 3.375 g via INTRAVENOUS
  Filled 2020-11-18: qty 50

## 2020-11-18 MED ORDER — METRONIDAZOLE IN NACL 5-0.79 MG/ML-% IV SOLN
500.0000 mg | Freq: Once | INTRAVENOUS | Status: DC
  Filled 2020-11-18: qty 100

## 2020-11-18 MED ORDER — ONDANSETRON HCL 4 MG PO TABS: 4.0000 mg | ORAL_TABLET | Freq: Four times a day (QID) | ORAL | Status: DC | PRN

## 2020-11-18 MED ORDER — HYDRALAZINE HCL 20 MG/ML IJ SOLN
5.0000 mg | INTRAMUSCULAR | Status: DC | PRN
  Administered 2020-11-19 – 2020-11-22 (×7): 5 mg via INTRAVENOUS
  Filled 2020-11-18 (×7): qty 1

## 2020-11-18 NOTE — ED Notes (Signed)
Pt lifted from wheelchair to stretcher, bilat leg amputations noted. Pt c/o diarrhea x 2 yesterday with rectal pain. Denies problems urinating, states he doesn't urinate much. Pt appears fatigued. Denies vomiting or fever at home.

## 2020-11-18 NOTE — ED Notes (Signed)
Pt placed on bedpan to have BM. Provided callbell to alert RN when he's done.

## 2020-11-18 NOTE — Consult Note (Signed)
[name] Lame, MD [name]  [address] [name], [name] [address] Phone: [phone] Fax : [phone]  Consultation  Referring Provider:     Dr. Blaine Hamper Primary Care Physician:  [name], MD Primary Gastroenterologist:  [name]         Reason for Consultation:      Bloody stools  Date of Admission:  11/18/2020 Date of Consultation:  11/18/2020         HPI:   [name] is a 60 y.o. male who was admitted with weakness and altered mental status.  The patient was seen by [name] back in March 2020 and at that time had refused any workup including an EGD or colonoscopy and stated he did not want any invasive procedures.  The patient was then seen by [name] at the end of last year with a questionable history of Crohn's disease.  The patient has a history of bilateral below-knee amputations with CVAs and end-stage renal disease.  The patient is on hemodialysis.  The patient when asked today if he had Crohn's disease he is not sure that he did although from Dr. Ricky Stabs note it appears that there is some history of the patient being treated with [name].  When [name] saw the patient he had been diagnosed with severe anemia of chronic kidney disease with elevated ferritin and he was taking iron supplementation.  The patient was set up for a colonoscopy with recommendations of evaluation the terminal ileum. On January 11 of this year the patient came for his colonoscopy and it was aborted when there was a large amount of stool seen. On admission today the patient was sent for a CT scan of the abdomen that showed:  IMPRESSION: 1. Marked circumferential thickening of the distal rectum and anus with suspected open fistula/dehiscence posteriorly. Adjacent skin and soft tissue thickening in the surrounding soft tissues likely reflecting chronic decubitus ulcer. Correlate with physical exam. 2. Prominent rectal stool ball. 3. Thickened appearance of the urinary bladder wall,  which may be accentuated by underdistention. Correlate with urinalysis to exclude cystitis. 4. Cholelithiasis without evidence of acute cholecystitis. 5. Marked extensive atherosclerosis. 6. Right sided pleural thickening and pleural calcification. 7. No acute osseous findings or evidence of osteomyelitis. Findings suggestive of renal osteodystrophy.    Past Medical History:  Diagnosis Date  . Crohn disease ([place])   . Diabetes mellitus without complication ([place])   . DVT of lower extremity (deep venous thrombosis) ([place]) 2016  . Empyema ([place]) 05/20/2017  . Encephalopathy 12/04/2017  . Fall at home, initial encounter 09/12/2020  . Hidradenitis suppurativa   . Hypertension   . ICH (intracerebral hemorrhage) ([place])   . Peritonitis ([place]) 04/21/2017  . Pyogenic arthritis of knee ([place]) 02/04/2016  . Renal disorder   . Sepsis ([place]) 01/12/2018  . Stroke [facility])     Past Surgical History:  Procedure Laterality Date  . ABDOMINAL SURGERY    . AMPUTATION FINGER Left 06/2019   [facility]  . ANGIOPLASTY Left    left fem-pop at [facility] 04-11-2018  . BELOW KNEE LEG AMPUTATION Right 08/2017   [facility]  . COLONOSCOPY    . COLONOSCOPY WITH PROPOFOL N/A 10/28/2020   Procedure: COLONOSCOPY WITH PROPOFOL;  Surgeon: Lin Landsman, MD;  Location: [facility] ENDOSCOPY;  Service: Gastroenterology;  Laterality: N/A;  . DIALYSIS/PERMA CATHETER INSERTION N/A 12/09/2017   Procedure: DIALYSIS/PERMA CATHETER INSERTION;  Surgeon: Katha Cabal, MD;  Location: [place] INVASIVE CV  LAB;  Service: Cardiovascular;  Laterality: N/A;  . DIALYSIS/PERMA CATHETER INSERTION N/A 12/12/2017   Procedure: DIALYSIS/PERMA CATHETER INSERTION;  Surgeon: Algernon Huxley, MD;  Location: [place] CV LAB;  Service: Cardiovascular;  Laterality: N/A;  . DIALYSIS/PERMA CATHETER REMOVAL Left 12/09/2017   Procedure: DIALYSIS/PERMA CATHETER REMOVAL;  Surgeon: Katha Cabal, MD;  Location: [place] CV LAB;   Service: Cardiovascular;  Laterality: Left;  . KNEE SURGERY Left 02/04/2016   [facility]  . LEG AMPUTATION THROUGH LOWER TIBIA AND FIBULA Left 06/22/2018   [facility]  . LOWER EXTREMITY ANGIOGRAPHY Right 08/08/2017   Procedure: Lower Extremity Angiography;  Surgeon: Algernon Huxley, MD;  Location: [place] CV LAB;  Service: Cardiovascular;  Laterality: Right;  . LOWER EXTREMITY ANGIOGRAPHY Right 08/22/2017   Procedure: Lower Extremity Angiography;  Surgeon: Algernon Huxley, MD;  Location: [place] CV LAB;  Service: Cardiovascular;  Laterality: Right;  . LOWER EXTREMITY INTERVENTION  08/08/2017   Procedure: LOWER EXTREMITY INTERVENTION;  Surgeon: Algernon Huxley, MD;  Location: [place] CV LAB;  Service: Cardiovascular;;  . LOWER EXTREMITY INTERVENTION  08/22/2017   Procedure: LOWER EXTREMITY INTERVENTION;  Surgeon: Algernon Huxley, MD;  Location: [place] CV LAB;  Service: Cardiovascular;;    Prior to Admission medications   Medication Sig Start Date End Date Taking? Authorizing Provider  acetaminophen (TYLENOL) 500 MG tablet Take 1,000 mg by mouth daily as needed for moderate pain or headache.     [provider]  Adalimumab ([name] PEN) 40 MG/0.4ML PNKT Inject 40 mg into the muscle once a week.  11/27/18   [provider]  Alcohol Swabs PADS Use as directed to check blood sugar three times daily for insulin dependent type 2 diabetes. 10/20/17   [name], MD  amLODipine (NORVASC) 10 MG tablet TAKE 1 TABLET(10 MG) BY MOUTH DAILY AS NEEDED 11/11/20   [name], MD  atorvastatin (LIPITOR) 80 MG tablet TAKE 1 TABLET(80 MG) BY MOUTH DAILY Patient taking differently: Take 80 mg by mouth daily. 06/25/20   [name], MD  AURYXIA 1 GM 210 MG(Fe) tablet Take 420 mg by mouth in the morning and at bedtime.  10/23/18   [provider]  Blood Glucose Monitoring Suppl (ONE TOUCH ULTRA 2) w/Device KIT Use as directed to check blood sugar three times daily. E11.9 02/20/18    [name], MD  carvedilol (COREG) 25 MG tablet Take 1 tablet (25 mg total) by mouth 2 (two) times daily. 06/27/20   [name], MD  cyanocobalamin (,VITAMIN B-12,) 1000 MCG/ML injection Inject 62m once a week for 4 weeks and then once a month for 3 months 09/23/20   VLin Landsman MD  furosemide (LASIX) 80 MG tablet TAKE 1 TABLET(80 MG) BY MOUTH TWICE DAILY 11/04/20   FBirdie Sons MD  gabapentin (NEURONTIN) 300 MG capsule Take 1 capsule (300 mg total) by mouth at bedtime. 09/14/20   GMercy Riding MD  insulin aspart (NOVOLOG) 100 UNIT/ML FlexPen Inject 3 Units into the skin 3 (three) times daily with meals.    [provider]  insulin glargine (LANTUS) 100 UNIT/ML injection Inject 10 Units into the skin at bedtime.  12/25/18   [provider]  levETIRAcetam (KEPPRA) 500 MG tablet Take 1 tablet (500 mg total) by mouth daily AND 1 tablet (500 mg total) every Monday, Wednesday, and Friday. After dialysis. 09/14/20   GMercy Riding MD  lisinopril (ZESTRIL) 20 MG tablet Take 20  mg by mouth daily.  09/08/20 09/08/21  [provider]  sevelamer carbonate (RENVELA) 800 MG tablet TAKE 1 TABLET(800 MG) BY MOUTH THREE TIMES DAILY 09/22/20   [name], MD    Family History  Problem Relation Age of Onset  . Irritable bowel syndrome Sister   . Diabetes Sister   . Heart disease Mother   . Diabetes Mother   . Heart disease Father   . Rheumatic fever Father        as child  . Psoriasis Brother   . Arthritis Brother   . Diabetes Sister   . Diabetes Sister      Social History   Tobacco Use  . Smoking status: Never Smoker  . Smokeless tobacco: Never Used  . Tobacco comment: smokes marijuana  Vaping Use  . Vaping Use: Never used  Substance Use Topics  . Alcohol use: No  . Drug use: Yes    Types: Marijuana    Allergies as of 11/18/2020 - Review Complete 11/18/2020  Allergen Reaction Noted  . Methotrexate Other (See Comments) 11/06/2015  .  Vancomycin Shortness Of Breath 11/06/2015  . Cefepime Other (See Comments) 09/04/2017  . Tape  08/25/2015    Review of Systems:    All systems reviewed and negative except where noted in HPI.   Physical Exam:  Vital signs in last 24 hours: Temp:  [99.6 F (37.6 C)-101 F (38.3 C)] 99.6 F (37.6 C) (02/01 1628) Pulse Rate:  [64-74] 64 (02/01 1628) Resp:  [16-17] 17 (02/01 1628) BP: (163-187)/(60-109) 163/60 (02/01 1628) SpO2:  [98 %-100 %] 98 % (02/01 1315) Weight:  [60.6 kg-66.7 kg] 60.6 kg (02/01 1628)   General:   Pleasant, cooperative in NAD Head:  Normocephalic and atraumatic. Eyes:   No icterus.   Conjunctiva pink. PERRLA. Ears:  Normal auditory acuity. Neck:  Supple; no masses or thyroidomegaly Lungs: Respirations even and unlabored. Lungs clear to auscultation bilaterally.   No wheezes, crackles, or rhonchi.  Heart:  Regular rate and rhythm;  Without murmur, clicks, rubs or gallops Abdomen:  Soft, nondistended, nontender. Normal bowel sounds. No appreciable masses or hepatomegaly.  No rebound or guarding.  Rectal:  External exam did not show any decubitus ulcers or signs of inflammation or abscess Msk:  Symmetrical without gross deformities.   Extremities:  Bilateral BKA's Neurologic:  Alert;  grossly normal neurologically. Skin:  Intact without significant lesions or rashes. Cervical Nodes:  No significant cervical adenopathy. Psych:  Alert and cooperative. Normal affect.  LAB RESULTS: Recent Labs    11/18/20 1101  WBC 9.2  HGB 8.5*  HCT 27.5*  PLT 128*   BMET Recent Labs    11/18/20 1101  NA 138  K 3.6  CL 95*  CO2 27  GLUCOSE 161*  BUN 30*  CREATININE 5.67*  CALCIUM 9.3   LFT No results for input(s): PROT, ALBUMIN, AST, ALT, ALKPHOS, BILITOT, BILIDIR, IBILI in the last 72 hours. PT/INR No results for input(s): LABPROT, INR in the last 72 hours.  STUDIES: CT ABDOMEN PELVIS WO CONTRAST  Result Date: 11/18/2020 CLINICAL DATA:  Weakness.   Clinical suspicion for diverticulitis. EXAM: CT ABDOMEN AND PELVIS WITHOUT CONTRAST TECHNIQUE: Multidetector CT imaging of the abdomen and pelvis was performed following the standard protocol without IV contrast. COMPARISON:  CT pelvis 12/06/2017, CT chest 05/19/2017 FINDINGS: Lower chest: Right sided pleural thickening and pleural calcification. Mild right basilar subsegmental atelectasis. Mild cardiomegaly. Coronary artery calcification. Hypoattenuation of the cardiac blood pool indicative of  anemia. Hepatobiliary: No focal liver abnormality. Small stones layer within the gallbladder lumen. No pericholecystic inflammatory changes by CT. Pancreas: Unremarkable. No pancreatic ductal dilatation or surrounding inflammatory changes. Spleen: Normal in size without focal abnormality. Adrenals/Urinary Tract: Diffuse bilateral adrenal gland thickening, similar to prior CT. Kidneys are normal in size. No hydronephrosis. Thickened appearance of the urinary bladder wall, which may be accentuated by underdistention. Stomach/Bowel: Stomach within normal limits. No dilated loops of small bowel to suggest obstruction. Normal appendix present in the right lower quadrant (series 2, image 61). Marked circumferential thickening of the distal rectum and anus with suspected open fistula/dehiscence posteriorly (series 2, images 93-94). Adjacent skin and soft tissue thickening in the surrounding soft tissues likely reflecting chronic decubitus ulcer. Prominent rectal stool ball. Remainder of the colon is within normal limits. Vascular/Lymphatic: Marked extensive atherosclerotic calcification including extensive small vessel arterial calcification. No lymphadenopathy evident. Reproductive: Prostate gland within normal limits. Other: No ascites. No abdominopelvic fluid collection. Tiny fat containing umbilical hernia. No pneumoperitoneum. Musculoskeletal: Mild diffusely sclerotic appearance of the osseous structures likely reflecting  sequela of renal osteodystrophy. No bony erosions or destructive changes. Multiple scattered Schmorl's nodes within the lumbar spine. Stable sclerotic bone island at the posterior left iliac bone. IMPRESSION: 1. Marked circumferential thickening of the distal rectum and anus with suspected open fistula/dehiscence posteriorly. Adjacent skin and soft tissue thickening in the surrounding soft tissues likely reflecting chronic decubitus ulcer. Correlate with physical exam. 2. Prominent rectal stool ball. 3. Thickened appearance of the urinary bladder wall, which may be accentuated by underdistention. Correlate with urinalysis to exclude cystitis. 4. Cholelithiasis without evidence of acute cholecystitis. 5. Marked extensive atherosclerosis. 6. Right sided pleural thickening and pleural calcification. 7. No acute osseous findings or evidence of osteomyelitis. Findings suggestive of renal osteodystrophy. Electronically Signed   By: Davina Poke D.O.   On: 11/18/2020 13:11   DG Chest 1 View  Result Date: 11/18/2020 CLINICAL DATA:  Altered mental status. EXAM: CHEST  1 VIEW COMPARISON:  Chest x-ray dated September 12, 2020. FINDINGS: The heart size and mediastinal contours are within normal limits. Atherosclerotic calcification of the aortic arch. Normal pulmonary vascularity. Chronic mild interstitial coarsening at the lung bases. No focal consolidation, pleural effusion, or pneumothorax. No acute osseous abnormality. Unchanged left axillary stent. IMPRESSION: 1. No active disease. Electronically Signed   By: Titus Dubin M.D.   On: 11/18/2020 12:13   CT Head Wo Contrast  Result Date: 11/18/2020 CLINICAL DATA:  Mental status change. EXAM: CT HEAD WITHOUT CONTRAST TECHNIQUE: Contiguous axial images were obtained from the base of the skull through the vertex without intravenous contrast. COMPARISON:  CT head 09/13/2020 FINDINGS: Brain: No change in chronic left MCA infarct in the frontal lobe and insula.  Dystrophic calcification in the infarct. Patchy white matter hypodensity unchanged. Generalized atrophy.  Negative for acute infarct, hemorrhage, mass. Vascular: Negative for hyperdense vessel Skull: Negative Sinuses/Orbits: Negative Other: Motion degraded study IMPRESSION: No acute abnormality Chronic right MCA infarct. Atrophy and chronic microvascular ischemic change in the white matter. Electronically Signed   By: Franchot Gallo M.D.   On: 11/18/2020 12:56      Impression / Plan:   Assessment: Principal Problem:   Bloody diarrhea Active Problems:   Hypercholesteremia   Hypertension   Crohn's disease ([name])   Type 2 diabetes mellitus with kidney complication, with long-term current use of insulin (HCC)   End stage renal failure on dialysis [facility])   History of CVA (cerebrovascular accident)  Seizure disorder ([place])   CVA (cerebral vascular accident) ([place])   Acute metabolic encephalopathy   Anemia in ESRD (end-stage renal disease) ([place])   Chronic systolic CHF (congestive heart failure) (HCC)   [name] is a 60 y.o. y/o male with multiple medical issues with a report of bloody stools although the stools on my examination were black.  The patient had an attempted colonoscopy back in January of this year (a few weeks ago) and his colon was devoid of stool and the procedure could not be completed. Despite the CT scan findings there was no external signs of a decubitus ulcer or abscess.  The patient's diaper was filled with black stool.  Plan:  This patient will need a evaluation of his GI tract which include looking for source of his black stools which would entail an upper endoscopy.  In addition to that there has been some concern about whether or not he has Crohn's disease and his CT scan showing thickening around the rectum and anus should be evaluated with a colonoscopy and to rule out any inflammatory bowel disease. The patient should be stabilized and his mental status improved  prior to him being able to take a prep for colonoscopy.  In light of his last colonoscopy not being cleaned out I believe a 2 day prep would be reasonable for this patient.  The patient may be started on a clear liquid diet pending the prep for the colonoscopy.  The patient has been explained the plan and agrees with it.  Thank you for involving me in the care of this patient.      LOS: 0 days   [name] Lame, MD, [facility] 11/18/2020, 4:47 PM,  Pager [phone] 7am-5pm  Check AMION for 5pm -7am coverage and on weekends   Note: This dictation was prepared with Dragon dictation along with smaller phrase technology. Any transcriptional errors that result from this process are unintentional.

## 2020-11-18 NOTE — Consult Note (Signed)
PHARMACY -  BRIEF ANTIBIOTIC NOTE   Pharmacy has received consult(s) for intra-abdominal infection from an ED provider.  The patient's profile has been reviewed for ht/wt/allergies/indication/available labs.    One time order(s) placed for levofloxacin. Discussed with Dr. Quentin Cornwall, pt received pip/tazo in the past 2018-2019 - no note of reaction in the chart. Will switch levofloxacin to pip/tazo x 1.   Further antibiotics/pharmacy consults should be ordered by admitting physician if indicated.                       Thank you, Oswald Hillock 11/18/2020  2:30 PM

## 2020-11-18 NOTE — Consult Note (Signed)
Pharmacy Antibiotic Note  [name] is a 60 y.o. male admitted on 11/18/2020 with intrabdominal infection. Pt. Has pmh of Crohn's disease, ESRD on HD, peritonitis, and diabetes. Pt came into ED w/ bloody diarrhea, confusion and fever. WBC's are WNL and Tmax 101. Pharmacy has been consulted for Zosyn dosing.  Plan: Initiate Zosyn 3.375gm IV loading dose x1 Start Zosyn 2.25gm IV q8h  Will f/u Bcx, and Scr.   Height: 5' 11"  (180.3 cm) Weight: 66.7 kg (147 lb) IBW/kg (Calculated) : 75.3  Temp (24hrs), Avg:100.5 F (38.1 C), Min:100 F (37.8 C), Max:101 F (38.3 C)  Recent Labs  Lab 11/18/20 1101 11/18/20 1125  WBC 9.2  --   CREATININE 5.67*  --   LATICACIDVEN  --  1.0    Estimated Creatinine Clearance: 13.4 mL/min (A) (by C-G formula based on SCr of 5.67 mg/dL (H)).    Allergies  Allergen Reactions   Methotrexate Other (See Comments)    Blood count drops   Vancomycin Shortness Of Breath    Eyes watering, SOB, wheezing   Cefepime Other (See Comments)   Tape     Antimicrobials this admission: 2/1 Zosyn >>   Microbiology results: 2/1 BCx: pending     Thank you for allowing pharmacy to be a part of this patient's care.  Emogene Morgan, Pharmacy Student  11/18/2020 4:10 PM

## 2020-11-18 NOTE — ED Notes (Signed)
Pt taken to xray 

## 2020-11-18 NOTE — ED Notes (Signed)
Pt taken to CT.

## 2020-11-18 NOTE — ED Notes (Signed)
Pt cleaned up with warm wipes. Dark thick stool noted. EDP at bedside to test stool. Positive for blood. Dirty clothes removed from pt. Clean brief placed on pt.

## 2020-11-18 NOTE — ED Triage Notes (Signed)
Pt comes into the ED via [facility] from home c/o weakness and feeling disoriented.  Pt states he woke up not knowing where he was and that's not normal.  PT also states he is weaker than normal.  Pt unable to answer all questions at this time for triage. Pt has even and unlabored respirations.  EMS left prior to report being given.

## 2020-11-18 NOTE — ED Provider Notes (Signed)
[facility] Emergency Department Provider Note    Event Date/Time   First MD Initiated Contact with Patient 11/18/20 1133     (approximate)  I have reviewed the triage vital signs and the nursing notes.   HISTORY  Chief Complaint Weakness and Altered Mental Status    HPI Scorpio B States is a 60 y.o. male history of Crohn's disease end-stage renal disease on dialysis Monday Wednesday Friday presents to the ER for generalized malaise fever and confusion weakness.  States he has been having some intermittent abdominal pain.  No cough or chest pain.  No recent antibiotics    Past Medical History:  Diagnosis Date  . Crohn disease ([place])   . Diabetes mellitus without complication ([place])   . DVT of lower extremity (deep venous thrombosis) ([place]) 2016  . Empyema ([place]) 05/20/2017  . Encephalopathy 12/04/2017  . Fall at home, initial encounter 09/12/2020  . Hidradenitis suppurativa   . Hypertension   . ICH (intracerebral hemorrhage) ([place])   . Peritonitis ([place]) 04/21/2017  . Pyogenic arthritis of knee ([place]) 02/04/2016  . Renal disorder   . Sepsis ([place]) 01/12/2018  . Stroke [facility])    Family History  Problem Relation Age of Onset  . Irritable bowel syndrome Sister   . Diabetes Sister   . Heart disease Mother   . Diabetes Mother   . Heart disease Father   . Rheumatic fever Father        as child  . Psoriasis Brother   . Arthritis Brother   . Diabetes Sister   . Diabetes Sister    Past Surgical History:  Procedure Laterality Date  . ABDOMINAL SURGERY    . AMPUTATION FINGER Left 06/2019   [facility]  . ANGIOPLASTY Left    left [facility]pop [facility] 04-11-2018  . BELOW KNEE LEG AMPUTATION Right 08/2017   [facility]  . COLONOSCOPY    . COLONOSCOPY WITH PROPOFOL N/A 10/28/2020   Procedure: COLONOSCOPY WITH PROPOFOL;  Surgeon: Lin Landsman, MD;  Location: [facility] ENDOSCOPY;  Service: Gastroenterology;  Laterality: N/A;  . DIALYSIS/PERMA  CATHETER INSERTION N/A 12/09/2017   Procedure: DIALYSIS/PERMA CATHETER INSERTION;  Surgeon: Katha Cabal, MD;  Location: [place] CV LAB;  Service: Cardiovascular;  Laterality: N/A;  . DIALYSIS/PERMA CATHETER INSERTION N/A 12/12/2017   Procedure: DIALYSIS/PERMA CATHETER INSERTION;  Surgeon: Algernon Huxley, MD;  Location: [place] CV LAB;  Service: Cardiovascular;  Laterality: N/A;  . DIALYSIS/PERMA CATHETER REMOVAL Left 12/09/2017   Procedure: DIALYSIS/PERMA CATHETER REMOVAL;  Surgeon: Katha Cabal, MD;  Location: [place] CV LAB;  Service: Cardiovascular;  Laterality: Left;  . KNEE SURGERY Left 02/04/2016   [facility]  . LEG AMPUTATION THROUGH LOWER TIBIA AND FIBULA Left 06/22/2018   [facility]  . LOWER EXTREMITY ANGIOGRAPHY Right 08/08/2017   Procedure: Lower Extremity Angiography;  Surgeon: Algernon Huxley, MD;  Location: [place] CV LAB;  Service: Cardiovascular;  Laterality: Right;  . LOWER EXTREMITY ANGIOGRAPHY Right 08/22/2017   Procedure: Lower Extremity Angiography;  Surgeon: Algernon Huxley, MD;  Location: [place] CV LAB;  Service: Cardiovascular;  Laterality: Right;  . LOWER EXTREMITY INTERVENTION  08/08/2017   Procedure: LOWER EXTREMITY INTERVENTION;  Surgeon: Algernon Huxley, MD;  Location: [place] CV LAB;  Service: Cardiovascular;;  . LOWER EXTREMITY INTERVENTION  08/22/2017   Procedure: LOWER EXTREMITY INTERVENTION;  Surgeon: Algernon Huxley, MD;  Location: [place] CV LAB;  Service: Cardiovascular;;   Patient Active Problem List  Diagnosis Date Noted  . Bloody diarrhea 11/18/2020  . Acute metabolic encephalopathy 54/00/8676  . Anemia in ESRD (end-stage renal disease) ([place]) 11/18/2020  . Chronic systolic CHF (congestive heart failure) ([place]) 11/18/2020  . Screening for colon cancer   . B12 deficiency 10/12/2020  . CVA (cerebral vascular accident) ([place]) 09/12/2020  . AMS (altered mental status) 09/12/2020  . Hyperkalemia 09/12/2020  . Mild cognitive  impairment 09/30/2019  . Anemia 01/05/2019  . Peripheral vascular disease ([place]) 06/28/2018  . Pressure injury of skin 12/04/2017  . S/P bilateral BKA (below knee amputation) ([place]) 10/20/2017  . Atherosclerosis of native arteries of extremity with rest pain ([place]) 08/05/2017  . History of CVA (cerebrovascular accident) 04/15/2017  . Seizure disorder ([place]) 04/15/2017  . End stage renal failure on dialysis ([place]) 04/12/2016  . Aphthae 02/20/2016  . Hidradenitis 02/20/2016  . Leg pain 02/20/2016  . Neuropathy 02/20/2016  . Narrowing of intervertebral disc space 08/29/2015  . Vascular disorder of lower extremity 08/29/2015  . Failure of erection 08/29/2015  . Hypercholesteremia 08/29/2015  . Hypertension 08/29/2015  . Anemia due to chronic kidney disease 05/26/2015  . Venous insufficiency of leg 09/04/2014  . History of deep vein thrombosis (DVT) of lower extremity 08/16/2014  . Prostatic intraepithelial neoplasia 11/02/2013  . Elevated prostate specific antigen (PSA) 09/11/2013  . Benign prostatic hyperplasia with urinary obstruction 08/13/2013  . Spermatocele 08/13/2013  . Avitaminosis D 01/25/2013  . Type 2 diabetes mellitus with kidney complication, with long-term current use of insulin ([place]) 03/28/2012  . Crohn's disease ([place]) 08/03/2011      Prior to Admission medications   Medication Sig Start Date End Date Taking? Authorizing Provider  acetaminophen (TYLENOL) 500 MG tablet Take 1,000 mg by mouth daily as needed for moderate pain or headache.     [provider]  Adalimumab ([name] PEN) 40 MG/0.4ML PNKT Inject 40 mg into the muscle once a week.  11/27/18   [provider]  Alcohol Swabs PADS Use as directed to check blood sugar three times daily for insulin dependent type 2 diabetes. 10/20/17   [name], MD  amLODipine (NORVASC) 10 MG tablet TAKE 1 TABLET(10 MG) BY MOUTH DAILY AS NEEDED 11/11/20   [name], MD  atorvastatin (LIPITOR) 80 MG tablet  TAKE 1 TABLET(80 MG) BY MOUTH DAILY Patient taking differently: Take 80 mg by mouth daily. 06/25/20   [name], MD  AURYXIA 1 GM 210 MG(Fe) tablet Take 420 mg by mouth in the morning and at bedtime.  10/23/18   [provider]  Blood Glucose Monitoring Suppl (ONE TOUCH ULTRA 2) w/Device KIT Use as directed to check blood sugar three times daily. E11.9 02/20/18   [name], MD  carvedilol (COREG) 25 MG tablet Take 1 tablet (25 mg total) by mouth 2 (two) times daily. 06/27/20   [name], MD  cyanocobalamin (,VITAMIN B-12,) 1000 MCG/ML injection Inject 70m once a week for 4 weeks and then once a month for 3 months 09/23/20   VLin Landsman MD  furosemide (LASIX) 80 MG tablet TAKE 1 TABLET(80 MG) BY MOUTH TWICE DAILY 11/04/20   FBirdie Sons MD  gabapentin (NEURONTIN) 300 MG capsule Take 1 capsule (300 mg total) by mouth at bedtime. 09/14/20   GMercy Riding MD  insulin aspart (NOVOLOG) 100 UNIT/ML FlexPen Inject 3 Units into the skin 3 (three) times daily with meals.    [provider]  insulin glargine (LANTUS) 100 UNIT/ML injection  Inject 10 Units into the skin at bedtime.  12/25/18   [provider]  levETIRAcetam (KEPPRA) 500 MG tablet Take 1 tablet (500 mg total) by mouth daily AND 1 tablet (500 mg total) every Monday, Wednesday, and Friday. After dialysis. 09/14/20   Mercy Riding, MD  lisinopril (ZESTRIL) 20 MG tablet Take 20 mg by mouth daily.  09/08/20 09/08/21  [provider]  sevelamer carbonate (RENVELA) 800 MG tablet TAKE 1 TABLET(800 MG) BY MOUTH THREE TIMES DAILY 09/22/20   [name], MD    Allergies Methotrexate, Vancomycin, Cefepime, and Tape    Social History Social History   Tobacco Use  . Smoking status: Never Smoker  . Smokeless tobacco: Never Used  . Tobacco comment: smokes marijuana  Vaping Use  . Vaping Use: Never used  Substance Use Topics  . Alcohol use: No  . Drug use: Yes    Types: Marijuana     Review of Systems Patient denies headaches, rhinorrhea, blurry vision, numbness, shortness of breath, chest pain, edema, cough, abdominal pain, nausea, vomiting, diarrhea, dysuria, fevers, rashes or hallucinations unless otherwise stated above in HPI. ____________________________________________   PHYSICAL EXAM:  VITAL SIGNS: Vitals:   11/18/20 1315 11/18/20 1416  BP: (!) 183/109   Pulse: 74   Resp: 16   Temp:  (!) 101 F (38.3 C)  SpO2: 98%     Constitutional: Alert, drowsy.  Eyes: Conjunctivae are normal.  Head: Atraumatic. Nose: No congestion/rhinnorhea. Mouth/Throat: Mucous membranes are moist.   Neck: No stridor. Painless ROM.  Cardiovascular: Normal rate, regular rhythm. Grossly normal heart sounds.  Good peripheral circulation. Respiratory: Normal respiratory effort.  No retractions. Lungs CTAB. Gastrointestinal: Soft and nontender. No distention. No abdominal bruits. No CVA tenderness. Genitourinary: Dark melanotic stool.  No fistula no surrounding cellulitic changes.  No sign of dehiscence or fistula. Musculoskeletal: No lower extremity tenderness nor edema.  No joint effusions. Neurologic:  Normal speech and language. No gross focal neurologic deficits are appreciated. No facial droop Skin:  Skin is warm, dry and intact. No rash noted. Psychiatric: Mood and affect are normal. Speech and behavior are normal.  ____________________________________________   LABS (all labs ordered are listed, but only abnormal results are displayed)  Results for orders placed or performed during the hospital encounter of 11/18/20 (from the past 24 hour(s))  Basic metabolic panel     Status: Abnormal   Collection Time: 11/18/20 11:01 AM  Result Value Ref Range   Sodium 138 135 - 145 mmol/L   Potassium 3.6 3.5 - 5.1 mmol/L   Chloride 95 (L) 98 - 111 mmol/L   CO2 27 22 - 32 mmol/L   Glucose, Bld 161 (H) 70 - 99 mg/dL   BUN 30 (H) 6 - 20 mg/dL   Creatinine, Ser 5.67 (H) 0.61 -  1.24 mg/dL   Calcium 9.3 8.9 - 10.3 mg/dL   GFR, Estimated 11 (L) >60 mL/min   Anion gap 16 (H) 5 - 15  CBC     Status: Abnormal   Collection Time: 11/18/20 11:01 AM  Result Value Ref Range   WBC 9.2 4.0 - 10.5 K/uL   RBC 3.24 (L) 4.22 - 5.81 MIL/uL   Hemoglobin 8.5 (L) 13.0 - 17.0 g/dL   HCT 27.5 (L) 39.0 - 52.0 %   MCV 84.9 80.0 - 100.0 fL   MCH 26.2 26.0 - 34.0 pg   MCHC 30.9 30.0 - 36.0 g/dL   RDW 20.4 (H) 11.5 - 15.5 %  Platelets 128 (L) 150 - 400 K/uL   nRBC 0.0 0.0 - 0.2 %  Lactic acid, plasma     Status: None   Collection Time: 11/18/20 11:25 AM  Result Value Ref Range   Lactic Acid, Venous 1.0 0.5 - 1.9 mmol/L  POC SARS Coronavirus 2 Ag-ED - Nasal Swab     Status: None   Collection Time: 11/18/20 12:03 PM  Result Value Ref Range   SARS Coronavirus 2 Ag NEGATIVE NEGATIVE  SARS Coronavirus 2 by RT PCR (hospital order, performed in [facility] hospital lab) Nasopharyngeal Nasopharyngeal Swab     Status: None   Collection Time: 11/18/20  1:01 PM   Specimen: Nasopharyngeal Swab  Result Value Ref Range   SARS Coronavirus 2 NEGATIVE NEGATIVE   ____________________________________________  EKG My review and personal interpretation at Time: 11:04   Indication: weakness  Rate: 75  Rhythm: sinus Axis: normal Other: normal intervals,. Nonspecific st abn ____________________________________________  RADIOLOGY  I personally reviewed all radiographic images ordered to evaluate for the above acute complaints and reviewed radiology reports and findings.  These findings were personally discussed with the patient.  Please see medical record for radiology report.  ____________________________________________   PROCEDURES  Procedure(s) performed:  Procedures    Critical Care performed: no ____________________________________________   INITIAL IMPRESSION / ASSESSMENT AND PLAN / ED COURSE  Pertinent labs & imaging results that were available during my care of the patient  were reviewed by me and considered in my medical decision making (see chart for details).   DDX: Dehydration, sepsis, pna, uti, hypoglycemia, cva, drug effect, withdrawal, encephalitis   Ilir B Hazell is a 60 y.o. who presents to the ED with presentation as described above.  Patient with broad differential potentially causing these symptoms.  Blood work as well as CT imaging will be ordered for the but differential.  Clinical Course as of 11/18/20 1512  Tue Nov 18, 2020  1413 No obvious proctitis, fistula or cellulitic changes in the perirectal area but does have black soft formed melanotic stool that is guaiac positive.  Core temperature is 101.  Lactate normal but as he is a dialysis patient with fever without other clear source will give dose of broad-spectrum antibiotics obtain cultures as well as start on IV Protonix infusion and discuss case with hospitalist for admission. [PR]    Clinical Course User Index [PR] Merlyn Lot, MD    The patient was evaluated in Emergency Department today for the symptoms described in the history of present illness. He/she was evaluated in the context of the global COVID-19 pandemic, which necessitated consideration that the patient might be at risk for infection with the SARS-CoV-2 virus that causes COVID-19. Institutional protocols and algorithms that pertain to the evaluation of patients at risk for COVID-19 are in a state of rapid change based on information released by regulatory bodies including the CDC and federal and state organizations. These policies and algorithms were followed during the patient's care in the ED.  As part of my medical decision making, I reviewed the following data within the [facility] notes reviewed and incorporated, Labs reviewed, notes from prior ED visits and [place] Controlled Substance Database   ____________________________________________   FINAL CLINICAL IMPRESSION(S) / ED DIAGNOSES  Final  diagnoses:  Altered mental status, unspecified altered mental status type  Melena  Fever, unspecified fever cause      NEW MEDICATIONS STARTED DURING THIS VISIT:  New Prescriptions   No medications on file  Note:  This document was prepared using Dragon voice recognition software and may include unintentional dictation errors.    Merlyn Lot, MD 11/18/20 [phone]

## 2020-11-18 NOTE — ED Notes (Signed)
Evelena Peat, NT to floor with pt now

## 2020-11-18 NOTE — Progress Notes (Signed)
Received patient from ED. Wife at bedside. Patient alert and orient x4. Left upper arm fistula. No complaints at this time. Will continue to monitor.

## 2020-11-18 NOTE — Plan of Care (Signed)

## 2020-11-18 NOTE — H&P (Signed)
History and Physical    [name] IHW:[id number] DOB: 02/02/1961 DOA: 11/18/2020  Referring MD/NP/PA:   PCP: [name], MD   Patient coming from:  The patient is coming from home.  At baseline, pt is independent for most of ADL.        Chief Complaint: rectal pain with melena   HPI: [name] is a 60 y.o. male with medical history significant Crohn's Dz, ESRD-HD (MWF), HTN, HLD, DM, stroke, seizure, sCHF, anemia, ICH, PVD, DVT not anticoagulated, bilateral BKA, presents with AMS and rectal pain with bleeding and melena.  Per patient's wife who is at the bedside, patient has been lethargic and mildly confused since yesterday.  He moves all extremities normally.  No facial droop or slurred speech.  Patient complains of rectal pain and black stool. He denies nausea, vomiting, abdominal pain.  Denies diarrhea.  Patient denies fever and chills, but he has temperature 101 in ED. patient has some mild dry cough, but no shortness breath, chest pain.  Wife states that patient urinated twice today which is unusual to him.  ED Course: pt was found to have hemoglobin 8.5 (7.2 on 09/14/2020), pending UA, negative Covid PCR, potassium 3.6, creatinine 5.67, BUN 30, temperature 101, blood pressure 183/109, heart rate 74, RR 16, chest x-ray negative.  CT of head is negative for acute intracranial abnormalities.  Patient is admitted to [facility] bed as inpatient.  Dr. Allen Norris of GI is consulted  CT of abdomen/pelvis: 1. Marked circumferential thickening of the distal rectum and anus with suspected open fistula/dehiscence posteriorly. Adjacent skin and soft tissue thickening in the surrounding soft tissues likely reflecting chronic decubitus ulcer. Correlate with physical exam. 2. Prominent rectal stool ball. 3. Thickened appearance of the urinary bladder wall, which may be accentuated by underdistention. Correlate with urinalysis to exclude cystitis. 4. Cholelithiasis without evidence of  acute cholecystitis. 5. Marked extensive atherosclerosis. 6. Right sided pleural thickening and pleural calcification. 7. No acute osseous findings or evidence of osteomyelitis. Findings suggestive of renal osteodystrophy.   Review of Systems:   General: no fevers, chills, no body weight gain, has fatigue HEENT: no blurry vision, hearing changes or sore throat Respiratory: no dyspnea, coughing, wheezing CV: no chest pain, no palpitations GI: Has rectal pain, black stool.  No nausea, vomiting, abdominal pain GU: no dysuria, burning on urination, increased urinary frequency, hematuria  Ext: no leg edema Neuro: no unilateral weakness, numbness, or tingling, no vision change or hearing loss. Has confusion Skin: no rash, no skin tear. MSK: No muscle spasm, no deformity, no limitation of range of movement in spin Heme: No easy bruising.  Travel history: No recent long distant travel.  Allergy:  Allergies  Allergen Reactions  . Methotrexate Other (See Comments)    Blood count drops  . Vancomycin Shortness Of Breath    Eyes watering, SOB, wheezing  . Cefepime Other (See Comments)  . Tape     Past Medical History:  Diagnosis Date  . Crohn disease ([place])   . Diabetes mellitus without complication ([place])   . DVT of lower extremity (deep venous thrombosis) ([place]) 2016  . Empyema ([place]) 05/20/2017  . Encephalopathy 12/04/2017  . Fall at home, initial encounter 09/12/2020  . Hidradenitis suppurativa   . Hypertension   . ICH (intracerebral hemorrhage) ([place])   . Peritonitis ([place]) 04/21/2017  . Pyogenic arthritis of knee ([place]) 02/04/2016  . Renal disorder   . Sepsis ([place]) 01/12/2018  . Stroke [facility])  Past Surgical History:  Procedure Laterality Date  . ABDOMINAL SURGERY    . AMPUTATION FINGER Left 06/2019   [facility]  . ANGIOPLASTY Left    left fem-pop at [facility] 04-11-2018  . BELOW KNEE LEG AMPUTATION Right 08/2017   [facility]  . COLONOSCOPY    . COLONOSCOPY  WITH PROPOFOL N/A 10/28/2020   Procedure: COLONOSCOPY WITH PROPOFOL;  Surgeon: Lin Landsman, MD;  Location: [facility] ENDOSCOPY;  Service: Gastroenterology;  Laterality: N/A;  . DIALYSIS/PERMA CATHETER INSERTION N/A 12/09/2017   Procedure: DIALYSIS/PERMA CATHETER INSERTION;  Surgeon: Katha Cabal, MD;  Location: [facility] CV LAB;  Service: Cardiovascular;  Laterality: N/A;  . DIALYSIS/PERMA CATHETER INSERTION N/A 12/12/2017   Procedure: DIALYSIS/PERMA CATHETER INSERTION;  Surgeon: Algernon Huxley, MD;  Location: [facility] CV LAB;  Service: Cardiovascular;  Laterality: N/A;  . DIALYSIS/PERMA CATHETER REMOVAL Left 12/09/2017   Procedure: DIALYSIS/PERMA CATHETER REMOVAL;  Surgeon: Katha Cabal, MD;  Location: [facility] CV LAB;  Service: Cardiovascular;  Laterality: Left;  . KNEE SURGERY Left 02/04/2016   [facility]  . LEG AMPUTATION THROUGH LOWER TIBIA AND FIBULA Left 06/22/2018   [facility]  . LOWER EXTREMITY ANGIOGRAPHY Right 08/08/2017   Procedure: Lower Extremity Angiography;  Surgeon: Algernon Huxley, MD;  Location: [facility] CV LAB;  Service: Cardiovascular;  Laterality: Right;  . LOWER EXTREMITY ANGIOGRAPHY Right 08/22/2017   Procedure: Lower Extremity Angiography;  Surgeon: Algernon Huxley, MD;  Location: [facility] CV LAB;  Service: Cardiovascular;  Laterality: Right;  . LOWER EXTREMITY INTERVENTION  08/08/2017   Procedure: LOWER EXTREMITY INTERVENTION;  Surgeon: Algernon Huxley, MD;  Location: [facility] CV LAB;  Service: Cardiovascular;;  . LOWER EXTREMITY INTERVENTION  08/22/2017   Procedure: LOWER EXTREMITY INTERVENTION;  Surgeon: Algernon Huxley, MD;  Location: [facility] CV LAB;  Service: Cardiovascular;;    Social History:  reports that he has never smoked. He has never used smokeless tobacco. He reports current drug use. Drug: Marijuana. He reports that he does not drink alcohol.  Family History:  Family History  Problem Relation Age of Onset  . Irritable bowel syndrome  Sister   . Diabetes Sister   . Heart disease Mother   . Diabetes Mother   . Heart disease Father   . Rheumatic fever Father        as child  . Psoriasis Brother   . Arthritis Brother   . Diabetes Sister   . Diabetes Sister      Prior to Admission medications   Medication Sig Start Date End Date Taking? Authorizing Provider  acetaminophen (TYLENOL) 500 MG tablet Take 1,000 mg by mouth daily as needed for moderate pain or headache.     [provider]  Adalimumab ([name] PEN) 40 MG/0.4ML PNKT Inject 40 mg into the muscle once a week.  11/27/18   [provider]  Alcohol Swabs PADS Use as directed to check blood sugar three times daily for insulin dependent type 2 diabetes. 10/20/17   [name], MD  amLODipine (NORVASC) 10 MG tablet TAKE 1 TABLET(10 MG) BY MOUTH DAILY AS NEEDED 11/11/20   [name], MD  atorvastatin (LIPITOR) 80 MG tablet TAKE 1 TABLET(80 MG) BY MOUTH DAILY Patient taking differently: Take 80 mg by mouth daily. 06/25/20   [name], MD  AURYXIA 1 GM 210 MG(Fe) tablet Take 420 mg by mouth in the morning and at bedtime.  10/23/18   [provider]  Blood Glucose  Monitoring Suppl (ONE TOUCH ULTRA 2) w/Device KIT Use as directed to check blood sugar three times daily. E11.9 02/20/18   [name], MD  carvedilol (COREG) 25 MG tablet Take 1 tablet (25 mg total) by mouth 2 (two) times daily. 06/27/20   [name], MD  cyanocobalamin (,VITAMIN B-12,) 1000 MCG/ML injection Inject 66m once a week for 4 weeks and then once a month for 3 months 09/23/20   VLin Landsman MD  furosemide (LASIX) 80 MG tablet TAKE 1 TABLET(80 MG) BY MOUTH TWICE DAILY 11/04/20   FBirdie Sons MD  gabapentin (NEURONTIN) 300 MG capsule Take 1 capsule (300 mg total) by mouth at bedtime. 09/14/20   GMercy Riding MD  insulin aspart (NOVOLOG) 100 UNIT/ML FlexPen Inject 3 Units into the skin 3 (three) times daily with meals.    [provider]   insulin glargine (LANTUS) 100 UNIT/ML injection Inject 10 Units into the skin at bedtime.  12/25/18   [provider]  levETIRAcetam (KEPPRA) 500 MG tablet Take 1 tablet (500 mg total) by mouth daily AND 1 tablet (500 mg total) every Monday, Wednesday, and Friday. After dialysis. 09/14/20   GMercy Riding MD  lisinopril (ZESTRIL) 20 MG tablet Take 20 mg by mouth daily.  09/08/20 09/08/21  [provider]  sevelamer carbonate (RENVELA) 800 MG tablet TAKE 1 TABLET(800 MG) BY MOUTH THREE TIMES DAILY 09/22/20   FBirdie Sons MD    Physical Exam: Vitals:   11/18/20 1059 11/18/20 1315 11/18/20 1416 11/18/20 1628  BP:  (!) 183/109  (!) 163/60  Pulse:  74  64  Resp:  16  17  Temp:   (!) 101 F (38.3 C) 99.6 F (37.6 C)  TempSrc:   Rectal Oral  SpO2:  98%    Weight: 66.7 kg   60.6 kg  Height: 5' 11" (1.803 m)   5' 11" (1.803 m)   General: Not in acute distress HEENT:       Eyes: PERRL, EOMI, no scleral icterus.       ENT: No discharge from the ears and nose, no pharynx injection, no tonsillar enlargement.        Neck: No JVD, no bruit, no mass felt. Heme: No neck lymph node enlargement. Cardiac: S1/S2, RRR, No murmurs, No gallops or rubs. Respiratory: No rales, wheezing, rhonchi or rubs. GI: Soft, nondistended, no abdominal tenderness, no rebound pain, no organomegaly, BS present. GU: No hematuria Ext: No pitting leg edema bilaterally. S/p of bilateral BKA Musculoskeletal: No joint deformities, No joint redness or warmth, no limitation of ROM in spin. Skin: No rashes.  Neuro: mildly confused, lethargic, but is still oriented X3, cranial nerves II-XII grossly intact, moves all extremities. Psych: Patient is not psychotic, no suicidal or hemocidal ideation.  Labs on Admission: I have personally reviewed following labs and imaging studies  CBC: Recent Labs  Lab 11/18/20 1101 11/18/20 1644  WBC 9.2 7.7  HGB 8.5* 7.5*  HCT 27.5* 24.5*  MCV 84.9 85.1  PLT 128*  1481   Basic Metabolic Panel: Recent Labs  Lab 11/18/20 1101  NA 138  K 3.6  CL 95*  CO2 27  GLUCOSE 161*  BUN 30*  CREATININE 5.67*  CALCIUM 9.3   GFR: Estimated Creatinine Clearance: 12.2 mL/min (A) (by C-G formula based on SCr of 5.67 mg/dL (H)). Liver Function Tests: No results for input(s): AST, ALT, ALKPHOS, BILITOT, PROT, ALBUMIN in the last 168 hours. No results for input(s):  LIPASE, AMYLASE in the last 168 hours. No results for input(s): AMMONIA in the last 168 hours. Coagulation Profile: Recent Labs  Lab 11/18/20 1101  INR 1.1   Cardiac Enzymes: No results for input(s): CKTOTAL, CKMB, CKMBINDEX, TROPONINI in the last 168 hours. BNP (last 3 results) No results for input(s): PROBNP in the last 8760 hours. HbA1C: No results for input(s): HGBA1C in the last 72 hours. CBG: Recent Labs  Lab 11/18/20 1735  GLUCAP 116*   Lipid Profile: No results for input(s): CHOL, HDL, LDLCALC, TRIG, CHOLHDL, LDLDIRECT in the last 72 hours. Thyroid Function Tests: No results for input(s): TSH, T4TOTAL, FREET4, T3FREE, THYROIDAB in the last 72 hours. Anemia Panel: No results for input(s): VITAMINB12, FOLATE, FERRITIN, TIBC, IRON, RETICCTPCT in the last 72 hours. Urine analysis:    Component Value Date/Time   COLORURINE YELLOW (A) 12/04/2017 0240   APPEARANCEUR HAZY (A) 12/04/2017 0240   LABSPEC 1.011 12/04/2017 0240   PHURINE 6.0 12/04/2017 0240   GLUCOSEU >=500 (A) 12/04/2017 0240   HGBUR NEGATIVE 12/04/2017 0240   BILIRUBINUR NEGATIVE 12/04/2017 0240   KETONESUR NEGATIVE 12/04/2017 0240   PROTEINUR 100 (A) 12/04/2017 0240   NITRITE NEGATIVE 12/04/2017 0240   LEUKOCYTESUR NEGATIVE 12/04/2017 0240   Sepsis Labs: _0 (procalcitonin:4,lacticidven:4) ) Recent Results (from the past 240 hour(s))  SARS Coronavirus 2 by RT PCR (hospital order, performed in [place] hospital lab) Nasopharyngeal Nasopharyngeal Swab     Status: None   Collection Time: 11/18/20  1:01  PM   Specimen: Nasopharyngeal Swab  Result Value Ref Range Status   SARS Coronavirus 2 NEGATIVE NEGATIVE Final    Comment: (NOTE) SARS-CoV-2 target nucleic acids are NOT DETECTED.  The SARS-CoV-2 RNA is generally detectable in upper and lower respiratory specimens during the acute phase of infection. The lowest concentration of SARS-CoV-2 viral copies this assay can detect is 250 copies / mL. A negative result does not preclude SARS-CoV-2 infection and should not be used as the sole basis for treatment or other patient management decisions.  A negative result may occur with improper specimen collection / handling, submission of specimen other than nasopharyngeal swab, presence of viral mutation(s) within the areas targeted by this assay, and inadequate number of viral copies (<250 copies / mL). A negative result must be combined with clinical observations, patient history, and epidemiological information.  Fact Sheet for Patients:   StrictlyIdeas.no  Fact Sheet for Healthcare Providers: BankingDealers.co.za  This test is not yet approved or  cleared by the [place] FDA and has been authorized for detection and/or diagnosis of SARS-CoV-2 by FDA under an Emergency Use Authorization (EUA).  This EUA will remain in effect (meaning this test can be used) for the duration of the COVID-19 declaration under Section 564(b)(1) of the Act, 21 U.S.C. section 360bbb-3(b)(1), unless the authorization is terminated or revoked sooner.  Performed at [facility], [address], [place], [place] [address]      Radiological Exams on Admission: CT ABDOMEN PELVIS WO CONTRAST  Result Date: 11/18/2020 CLINICAL DATA:  Weakness.  Clinical suspicion for diverticulitis. EXAM: CT ABDOMEN AND PELVIS WITHOUT CONTRAST TECHNIQUE: Multidetector CT imaging of the abdomen and pelvis was performed following the standard protocol without IV contrast.  COMPARISON:  CT pelvis 12/06/2017, CT chest 05/19/2017 FINDINGS: Lower chest: Right sided pleural thickening and pleural calcification. Mild right basilar subsegmental atelectasis. Mild cardiomegaly. Coronary artery calcification. Hypoattenuation of the cardiac blood pool indicative of anemia. Hepatobiliary: No focal liver abnormality. Small stones layer within the  gallbladder lumen. No pericholecystic inflammatory changes by CT. Pancreas: Unremarkable. No pancreatic ductal dilatation or surrounding inflammatory changes. Spleen: Normal in size without focal abnormality. Adrenals/Urinary Tract: Diffuse bilateral adrenal gland thickening, similar to prior CT. Kidneys are normal in size. No hydronephrosis. Thickened appearance of the urinary bladder wall, which may be accentuated by underdistention. Stomach/Bowel: Stomach within normal limits. No dilated loops of small bowel to suggest obstruction. Normal appendix present in the right lower quadrant (series 2, image 61). Marked circumferential thickening of the distal rectum and anus with suspected open fistula/dehiscence posteriorly (series 2, images 93-94). Adjacent skin and soft tissue thickening in the surrounding soft tissues likely reflecting chronic decubitus ulcer. Prominent rectal stool ball. Remainder of the colon is within normal limits. Vascular/Lymphatic: Marked extensive atherosclerotic calcification including extensive small vessel arterial calcification. No lymphadenopathy evident. Reproductive: Prostate gland within normal limits. Other: No ascites. No abdominopelvic fluid collection. Tiny fat containing umbilical hernia. No pneumoperitoneum. Musculoskeletal: Mild diffusely sclerotic appearance of the osseous structures likely reflecting sequela of renal osteodystrophy. No bony erosions or destructive changes. Multiple scattered Schmorl's nodes within the lumbar spine. Stable sclerotic bone island at the posterior left iliac bone. IMPRESSION: 1. Marked  circumferential thickening of the distal rectum and anus with suspected open fistula/dehiscence posteriorly. Adjacent skin and soft tissue thickening in the surrounding soft tissues likely reflecting chronic decubitus ulcer. Correlate with physical exam. 2. Prominent rectal stool ball. 3. Thickened appearance of the urinary bladder wall, which may be accentuated by underdistention. Correlate with urinalysis to exclude cystitis. 4. Cholelithiasis without evidence of acute cholecystitis. 5. Marked extensive atherosclerosis. 6. Right sided pleural thickening and pleural calcification. 7. No acute osseous findings or evidence of osteomyelitis. Findings suggestive of renal osteodystrophy. Electronically Signed   By: Davina Poke D.O.   On: 11/18/2020 13:11   DG Chest 1 View  Result Date: 11/18/2020 CLINICAL DATA:  Altered mental status. EXAM: CHEST  1 VIEW COMPARISON:  Chest x-ray dated September 12, 2020. FINDINGS: The heart size and mediastinal contours are within normal limits. Atherosclerotic calcification of the aortic arch. Normal pulmonary vascularity. Chronic mild interstitial coarsening at the lung bases. No focal consolidation, pleural effusion, or pneumothorax. No acute osseous abnormality. Unchanged left axillary stent. IMPRESSION: 1. No active disease. Electronically Signed   By: Titus Dubin M.D.   On: 11/18/2020 12:13   CT Head Wo Contrast  Result Date: 11/18/2020 CLINICAL DATA:  Mental status change. EXAM: CT HEAD WITHOUT CONTRAST TECHNIQUE: Contiguous axial images were obtained from the base of the skull through the vertex without intravenous contrast. COMPARISON:  CT head 09/13/2020 FINDINGS: Brain: No change in chronic left MCA infarct in the frontal lobe and insula. Dystrophic calcification in the infarct. Patchy white matter hypodensity unchanged. Generalized atrophy.  Negative for acute infarct, hemorrhage, mass. Vascular: Negative for hyperdense vessel Skull: Negative Sinuses/Orbits:  Negative Other: Motion degraded study IMPRESSION: No acute abnormality Chronic right MCA infarct. Atrophy and chronic microvascular ischemic change in the white matter. Electronically Signed   By: Franchot Gallo M.D.   On: 11/18/2020 12:56     EKG: I have personally reviewed.  Not done in ED, will get one.   Assessment/Plan Principal Problem:   Melena Active Problems:   Hypercholesteremia   Hypertension   Crohn's disease ([place])   Type 2 diabetes mellitus with kidney complication, with long-term current use of insulin (HCC)   End stage renal failure on dialysis [facility])   History of CVA (cerebrovascular accident)   Seizure disorder ([place])  CVA (cerebral vascular accident) ([place])   Acute metabolic encephalopathy   Anemia in ESRD (end-stage renal disease) ([place])   Chronic systolic CHF (congestive heart failure) ([place])   Melena: Patient has some melena, denies nausea, vomiting, diarrhea.  He has rectal pain, which may be due to history of hidradenitis per his wife.  Patient has fever of 101 in ED.  Does not meet criteria for sepsis.  Since patient has history of Crohn's disease, not sure if patient has Crohn's disease flareup.  Dr. Allen Norris of GI is consulted.  Per Dr. Allen Norris, patient will need 2 days of preparation for colonoscopy since he did not have good repair for colonoscopy recently.  -Admit to [facility] bed as inpatient -IV Zosyn was started in ED, will continue -Follow-up of blood culture -Clear liquid diet okay per GI -f/u UA  Crohn's disease: pt is receiving [name] injection weekly -f/u GI recommendations  Hypercholesteremia -lipitor  Hypertension -IV hydralazine as needed -Amlodipine, Coreg, lisinopril  Type 2 diabetes mellitus with kidney complication, with long-term current use of insulin ([place]): Recent A1c 6.7, well controlled.  Patient is taking NovoLog and Lantus -Sliding scale insulin -Decrease Lantus dose from 10 to 7 units daily  End stage renal failure on dialysis (MWF):  Last dialysis was yesterday -Message sent to Dr. Candiss Norse for renal for dialysis  History of CVA (cerebrovascular accident) -Lipitor  Seizure -Seizure precaution -When necessary Ativan for seizure -Continue Home medications: Keppra  Acute metabolic encephalopathy: Etiology is not clear.  CT head negative.  No focal neuro deficit on physical examination.  Possibly due to ongoing infection -Frequent neuro check  Anemia in ESRD (end-stage renal disease) ([place]) -Follow-up by CBC -Continue iron supplement  Chronic systolic CHF (congestive heart failure) ([place]): 2D echo on 09/13/2020 showed EF of 40-50% with grade 2 diastolic dysfunction.  Patient does not have respiratory distress.  No pulmonary edema on chest x-ray.  CHF is compensated. -Volume management by dialysis per renal         DVT ppx: SCD Code Status: Full code Family Communication:   Yes, patient's wife at bed side Disposition Plan:  Anticipate discharge back to previous environment Consults called:  Dr. Allen Norris of GI and Dr. Candiss Norse of renal Admission status and Level of care: Med-Surg as inpt   Status is: Inpatient.  Patient has multiple comorbidities, now presents with melena with fever.  Patient also has altered mental status.  Per GI, patient will need to 2 days of preparation for colonoscopy.  His presentation is highly complicated.  Patient is at high risk of deteriorating.  Will need to be treated in hospital for at least 2 days  Dispo: The patient is from: Home              Anticipated d/c is to: Home              Anticipated d/c date is: 2 days              Patient currently is not medically stable to d/c.   Difficult to place patient No          Date of Service 11/18/2020    Ivor Costa [facility]   If 7PM-7AM, please contact night-coverage [email or web address] 11/18/2020, 5:52 PM

## 2020-11-19 DIAGNOSIS — K921 Melena: Secondary | ICD-10-CM | POA: Diagnosis not present

## 2020-11-19 LAB — CBC
HCT: 24.1 % — ABNORMAL LOW (ref 39.0–52.0)
HCT: 25.8 % — ABNORMAL LOW (ref 39.0–52.0)
HCT: 22.9 % — ABNORMAL LOW (ref 39.0–52.0)
Hemoglobin: 7.5 g/dL — ABNORMAL LOW (ref 13.0–17.0)
Hemoglobin: 8 g/dL — ABNORMAL LOW (ref 13.0–17.0)
Hemoglobin: 7 g/dL — ABNORMAL LOW (ref 13.0–17.0)
MCH: 26.3 pg (ref 26.0–34.0)
MCH: 26.2 pg (ref 26.0–34.0)
MCH: 26.1 pg (ref 26.0–34.0)
MCHC: 31.1 g/dL (ref 30.0–36.0)
MCHC: 31 g/dL (ref 30.0–36.0)
MCHC: 30.6 g/dL (ref 30.0–36.0)
MCV: 84.6 fL (ref 80.0–100.0)
MCV: 84.6 fL (ref 80.0–100.0)
MCV: 85.4 fL (ref 80.0–100.0)
Platelets: 104 10*3/uL — ABNORMAL LOW (ref 150–400)
Platelets: 106 10*3/uL — ABNORMAL LOW (ref 150–400)
Platelets: 98 10*3/uL — ABNORMAL LOW (ref 150–400)
RBC: 2.85 MIL/uL — ABNORMAL LOW (ref 4.22–5.81)
RBC: 3.05 MIL/uL — ABNORMAL LOW (ref 4.22–5.81)
RBC: 2.68 MIL/uL — ABNORMAL LOW (ref 4.22–5.81)
RDW: 19.8 % — ABNORMAL HIGH (ref 11.5–15.5)
RDW: 20.1 % — ABNORMAL HIGH (ref 11.5–15.5)
RDW: 19.5 % — ABNORMAL HIGH (ref 11.5–15.5)
WBC: 6.6 10*3/uL (ref 4.0–10.5)
WBC: 7.2 10*3/uL (ref 4.0–10.5)
WBC: 7.2 10*3/uL (ref 4.0–10.5)
nRBC: 0 % (ref 0.0–0.2)
nRBC: 0 % (ref 0.0–0.2)
nRBC: 0 % (ref 0.0–0.2)

## 2020-11-19 LAB — GLUCOSE, CAPILLARY
Glucose-Capillary: 94 mg/dL (ref 70–99)
Glucose-Capillary: 97 mg/dL (ref 70–99)
Glucose-Capillary: 81 mg/dL (ref 70–99)
Glucose-Capillary: 78 mg/dL (ref 70–99)

## 2020-11-19 LAB — PREPARE RBC (CROSSMATCH)

## 2020-11-19 LAB — BASIC METABOLIC PANEL
Anion gap: 15 (ref 5–15)
BUN: 40 mg/dL — ABNORMAL HIGH (ref 6–20)
CO2: 26 mmol/L (ref 22–32)
Calcium: 8.8 mg/dL — ABNORMAL LOW (ref 8.9–10.3)
Chloride: 97 mmol/L — ABNORMAL LOW (ref 98–111)
Creatinine, Ser: 6.99 mg/dL — ABNORMAL HIGH (ref 0.61–1.24)
GFR, Estimated: 8 mL/min — ABNORMAL LOW (ref >60)
Glucose, Bld: 105 mg/dL — ABNORMAL HIGH (ref 70–99)
Potassium: 3.6 mmol/L (ref 3.5–5.1)
Sodium: 138 mmol/L (ref 135–145)

## 2020-11-19 MED ORDER — EPOETIN ALFA 10000 UNIT/ML IJ SOLN: 4000.0000 [IU] | INTRAMUSCULAR | Status: DC

## 2020-11-19 MED ORDER — PEG 3350-KCL-NA BICARB-NACL 420 G PO SOLR
4000.0000 mL | Freq: Once | ORAL | Status: AC
  Administered 2020-11-19: 4000 mL via ORAL
  Filled 2020-11-19: qty 4000

## 2020-11-19 MED ORDER — EPOETIN ALFA 10000 UNIT/ML IJ SOLN
4000.0000 [IU] | INTRAMUSCULAR | Status: DC
  Administered 2020-11-19 – 2020-11-21 (×2): 4000 [IU] via INTRAVENOUS
  Filled 2020-11-19: qty 1

## 2020-11-19 MED ORDER — CHLORHEXIDINE GLUCONATE CLOTH 2 % EX PADS
6.0000 | MEDICATED_PAD | Freq: Every day | CUTANEOUS | Status: DC
  Administered 2020-11-20 – 2020-11-21 (×2): 6 via TOPICAL

## 2020-11-19 MED ORDER — SODIUM CHLORIDE 0.9% IV SOLUTION: Freq: Once | INTRAVENOUS | Status: AC

## 2020-11-19 MED ORDER — LEVETIRACETAM 500 MG PO TABS
500.0000 mg | ORAL_TABLET | Freq: Every day | ORAL | Status: DC
  Administered 2020-11-19 – 2020-11-25 (×6): 500 mg via ORAL
  Filled 2020-11-19 (×7): qty 1

## 2020-11-19 MED ORDER — LEVETIRACETAM 500 MG PO TABS
500.0000 mg | ORAL_TABLET | ORAL | Status: DC
  Administered 2020-11-19 – 2020-11-21 (×2): 500 mg via ORAL
  Filled 2020-11-19 (×3): qty 1

## 2020-11-19 NOTE — Progress Notes (Signed)
PROGRESS NOTE    [name]  OAC:[id number] DOB: Aug 22, 1961 DOA: 11/18/2020 PCP: [name], MD    Brief Narrative:  [name] is a 60 y.o. male with medical history significant Crohn's Dz, ESRD-HD (MWF), HTN, HLD, DM, stroke, seizure, sCHF, anemia, ICH, PVD, DVT not anticoagulated, bilateral BKA, presents with AMS and rectal pain with bleeding and melena.  2/2-has not had any further melena.  Hematuria persists today.  Consultants:   Nephrology, GI  Procedures: Hemodialysis CT of abdomen/pelvis: 1. Marked circumferential thickening of the distal rectum and anus with suspected open fistula/dehiscence posteriorly. Adjacent skin and soft tissue thickening in the surrounding soft tissues likely reflecting chronic decubitus ulcer. Correlate with physical exam. 2. Prominent rectal stool ball. 3. Thickened appearance of the urinary bladder wall, which may be accentuated by underdistention. Correlate with urinalysis to exclude cystitis. 4. Cholelithiasis without evidence of acute cholecystitis. 5. Marked extensive atherosclerosis. 6. Right sided pleural thickening and pleural calcification. 7. No acute osseous findings or evidence of osteomyelitis. Findings suggestive of renal osteodystrophy   Antimicrobials:   Zosyn   Subjective: Has no complaints.  Denies shortness of breath, abdominal pain, or dizziness  Objective: Vitals:   11/19/20 0103 11/19/20 0446 11/19/20 0739 11/19/20 1134  BP: (!) 138/58 (!) 145/64 (!) 152/66 (!) 165/78  Pulse: (!) 59 (!) 59 61 64  Resp: 20 18 15 16   Temp: 99.4 F (37.4 C) 99.3 F (37.4 C) 99.2 F (37.3 C) 98.6 F (37 C)  TempSrc:  Oral Oral Oral  SpO2: 97% 100% 100% 100%  Weight:      Height:        Intake/Output Summary (Last 24 hours) at 11/19/2020 1706 Last data filed at 11/18/2020 1800 Gross per 24 hour  Intake 61.64 ml  Output --  Net 61.64 ml   Filed Weights   11/18/20 1059 11/18/20 1628  Weight: 66.7 kg 60.6  kg    Examination:  General exam: Appears calm and comfortable  Respiratory system: Clear to auscultation. Respiratory effort normal. Cardiovascular system: S1 & S2 heard, RRR. No JVD, murmurs, rubs, gallops or clicks. Gastrointestinal system: Abdomen is nondistended, soft and nontender.  Normal bowel sounds heard. Central nervous system: Alert and oriented.  Grossly intact Extremities: No edema Skin: Warm and dry Psychiatry: Judgement and insight appear normal. Mood & affect appropriate.     Data Reviewed: I have personally reviewed following labs and imaging studies  CBC: Recent Labs  Lab 11/18/20 1101 11/18/20 1644 11/18/20 2124 11/19/20 0321 11/19/20 0911  WBC 9.2 7.7 6.7 6.6 7.2  HGB 8.5* 7.5* 7.8* 7.5* 8.0*  HCT 27.5* 24.5* 25.4* 24.1* 25.8*  MCV 84.9 85.1 85.5 84.6 84.6  PLT 128* 111* 108* 104* 010*   Basic Metabolic Panel: Recent Labs  Lab 11/18/20 1101 11/19/20 0321  NA 138 138  K 3.6 3.6  CL 95* 97*  CO2 27 26  GLUCOSE 161* 105*  BUN 30* 40*  CREATININE 5.67* 6.99*  CALCIUM 9.3 8.8*   GFR: Estimated Creatinine Clearance: 9.9 mL/min (A) (by C-G formula based on SCr of 6.99 mg/dL (H)). Liver Function Tests: No results for input(s): AST, ALT, ALKPHOS, BILITOT, PROT, ALBUMIN in the last 168 hours. No results for input(s): LIPASE, AMYLASE in the last 168 hours. No results for input(s): AMMONIA in the last 168 hours. Coagulation Profile: Recent Labs  Lab 11/18/20 1101  INR 1.1   Cardiac Enzymes: No results for input(s): CKTOTAL, CKMB, CKMBINDEX, TROPONINI in the last 168 hours.  BNP (last 3 results) No results for input(s): PROBNP in the last 8760 hours. HbA1C: No results for input(s): HGBA1C in the last 72 hours. CBG: Recent Labs  Lab 11/18/20 1735 11/18/20 2106 11/18/20 2137 11/19/20 0755 11/19/20 1133  GLUCAP 116* 105* 102* 94 97   Lipid Profile: No results for input(s): CHOL, HDL, LDLCALC, TRIG, CHOLHDL, LDLDIRECT in the last 72  hours. Thyroid Function Tests: No results for input(s): TSH, T4TOTAL, FREET4, T3FREE, THYROIDAB in the last 72 hours. Anemia Panel: No results for input(s): VITAMINB12, FOLATE, FERRITIN, TIBC, IRON, RETICCTPCT in the last 72 hours. Sepsis Labs: Recent Labs  Lab 11/18/20 1125  LATICACIDVEN 1.0    Recent Results (from the past 240 hour(s))  Blood culture (routine x 2)     Status: None (Preliminary result)   Collection Time: 11/18/20  1:01 PM   Specimen: BLOOD  Result Value Ref Range Status   Specimen Description BLOOD RIGHT ANTECUBITAL  Final   Special Requests   Final    BOTTLES DRAWN AEROBIC AND ANAEROBIC Blood Culture results may not be optimal due to an excessive volume of blood received in culture bottles   Culture   Final    NO GROWTH < 24 HOURS Performed at [facility], [address] [place], [place] [address]    Report Status PENDING  Incomplete  SARS Coronavirus 2 by RT PCR (hospital order, performed in [place] hospital lab) Nasopharyngeal Nasopharyngeal Swab     Status: None   Collection Time: 11/18/20  1:01 PM   Specimen: Nasopharyngeal Swab  Result Value Ref Range Status   SARS Coronavirus 2 NEGATIVE NEGATIVE Final    Comment: (NOTE) SARS-CoV-2 target nucleic acids are NOT DETECTED.  The SARS-CoV-2 RNA is generally detectable in upper and lower respiratory specimens during the acute phase of infection. The lowest concentration of SARS-CoV-2 viral copies this assay can detect is 250 copies / mL. A negative result does not preclude SARS-CoV-2 infection and should not be used as the sole basis for treatment or other patient management decisions.  A negative result may occur with improper specimen collection / handling, submission of specimen other than nasopharyngeal swab, presence of viral mutation(s) within the areas targeted by this assay, and inadequate number of viral copies (<250 copies / mL). A negative result must be combined with  clinical observations, patient history, and epidemiological information.  Fact Sheet for Patients:   StrictlyIdeas.no  Fact Sheet for Healthcare Providers: BankingDealers.co.za  This test is not yet approved or  cleared by the [place] FDA and has been authorized for detection and/or diagnosis of SARS-CoV-2 by FDA under an Emergency Use Authorization (EUA).  This EUA will remain in effect (meaning this test can be used) for the duration of the COVID-19 declaration under Section 564(b)(1) of the Act, 21 U.S.C. section 360bbb-3(b)(1), unless the authorization is terminated or revoked sooner.  Performed at [facility], [address] [place], [place] [address]   Blood culture (routine x 2)     Status: None (Preliminary result)   Collection Time: 11/18/20  4:42 PM   Specimen: BLOOD  Result Value Ref Range Status   Specimen Description BLOOD LEFT ANTECUBITAL  Final   Special Requests   Final    BOTTLES DRAWN AEROBIC AND ANAEROBIC Blood Culture adequate volume   Culture   Final    NO GROWTH < 24 HOURS Performed at [facility], [address], [place], [place] [address]    Report Status PENDING  Incomplete         Radiology Studies: CT ABDOMEN PELVIS WO CONTRAST  Result Date: 11/18/2020 CLINICAL DATA:  Weakness.  Clinical suspicion for diverticulitis. EXAM: CT ABDOMEN AND PELVIS WITHOUT CONTRAST TECHNIQUE: Multidetector CT imaging of the abdomen and pelvis was performed following the standard protocol without IV contrast. COMPARISON:  CT pelvis 12/06/2017, CT chest 05/19/2017 FINDINGS: Lower chest: Right sided pleural thickening and pleural calcification. Mild right basilar subsegmental atelectasis. Mild cardiomegaly. Coronary artery calcification. Hypoattenuation of the cardiac blood pool indicative of anemia. Hepatobiliary: No focal liver abnormality. Small stones layer within the gallbladder lumen. No  pericholecystic inflammatory changes by CT. Pancreas: Unremarkable. No pancreatic ductal dilatation or surrounding inflammatory changes. Spleen: Normal in size without focal abnormality. Adrenals/Urinary Tract: Diffuse bilateral adrenal gland thickening, similar to prior CT. Kidneys are normal in size. No hydronephrosis. Thickened appearance of the urinary bladder wall, which may be accentuated by underdistention. Stomach/Bowel: Stomach within normal limits. No dilated loops of small bowel to suggest obstruction. Normal appendix present in the right lower quadrant (series 2, image 61). Marked circumferential thickening of the distal rectum and anus with suspected open fistula/dehiscence posteriorly (series 2, images 93-94). Adjacent skin and soft tissue thickening in the surrounding soft tissues likely reflecting chronic decubitus ulcer. Prominent rectal stool ball. Remainder of the colon is within normal limits. Vascular/Lymphatic: Marked extensive atherosclerotic calcification including extensive small vessel arterial calcification. No lymphadenopathy evident. Reproductive: Prostate gland within normal limits. Other: No ascites. No abdominopelvic fluid collection. Tiny fat containing umbilical hernia. No pneumoperitoneum. Musculoskeletal: Mild diffusely sclerotic appearance of the osseous structures likely reflecting sequela of renal osteodystrophy. No bony erosions or destructive changes. Multiple scattered Schmorl's nodes within the lumbar spine. Stable sclerotic bone island at the posterior left iliac bone. IMPRESSION: 1. Marked circumferential thickening of the distal rectum and anus with suspected open fistula/dehiscence posteriorly. Adjacent skin and soft tissue thickening in the surrounding soft tissues likely reflecting chronic decubitus ulcer. Correlate with physical exam. 2. Prominent rectal stool ball. 3. Thickened appearance of the urinary bladder wall, which may be accentuated by underdistention.  Correlate with urinalysis to exclude cystitis. 4. Cholelithiasis without evidence of acute cholecystitis. 5. Marked extensive atherosclerosis. 6. Right sided pleural thickening and pleural calcification. 7. No acute osseous findings or evidence of osteomyelitis. Findings suggestive of renal osteodystrophy. Electronically Signed   By: Davina Poke D.O.   On: 11/18/2020 13:11   DG Chest 1 View  Result Date: 11/18/2020 CLINICAL DATA:  Altered mental status. EXAM: CHEST  1 VIEW COMPARISON:  Chest x-ray dated September 12, 2020. FINDINGS: The heart size and mediastinal contours are within normal limits. Atherosclerotic calcification of the aortic arch. Normal pulmonary vascularity. Chronic mild interstitial coarsening at the lung bases. No focal consolidation, pleural effusion, or pneumothorax. No acute osseous abnormality. Unchanged left axillary stent. IMPRESSION: 1. No active disease. Electronically Signed   By: Titus Dubin M.D.   On: 11/18/2020 12:13   CT Head Wo Contrast  Result Date: 11/18/2020 CLINICAL DATA:  Mental status change. EXAM: CT HEAD WITHOUT CONTRAST TECHNIQUE: Contiguous axial images were obtained from the base of the skull through the vertex without intravenous contrast. COMPARISON:  CT head 09/13/2020 FINDINGS: Brain: No change in chronic left MCA infarct in the frontal lobe and insula. Dystrophic calcification in the infarct. Patchy white matter hypodensity unchanged. Generalized atrophy.  Negative for acute infarct, hemorrhage, mass. Vascular: Negative for hyperdense vessel Skull: Negative Sinuses/Orbits: Negative Other: Motion degraded study IMPRESSION: No acute abnormality Chronic right  MCA infarct. Atrophy and chronic microvascular ischemic change in the white matter. Electronically Signed   By: Franchot Gallo M.D.   On: 11/18/2020 12:56        Scheduled Meds: . amLODipine  10 mg Oral Daily  . atorvastatin  80 mg Oral Daily  . carvedilol  25 mg Oral BID  . [START ON  11/20/2020] Chlorhexidine Gluconate Cloth  6 each Topical Q0600  . epoetin (EPOGEN/PROCRIT) injection  4,000 Units Intravenous Q M,W,F-HD  . ferric citrate  420 mg Oral BID  . furosemide  80 mg Oral BID  . gabapentin  300 mg Oral QHS  . insulin aspart  0-5 Units Subcutaneous QHS  . insulin aspart  0-9 Units Subcutaneous TID WC  . insulin glargine  7 Units Subcutaneous QHS  . levETIRAcetam  500 mg Oral Daily  . levETIRAcetam  500 mg Oral Q M,W,F-1800  . lisinopril  20 mg Oral Daily  . [START ON 11/22/2020] pantoprazole  40 mg Intravenous Q12H  . polyethylene glycol-electrolytes  4,000 mL Oral Once  . sevelamer carbonate  800 mg Oral TID WC   Continuous Infusions: . sodium chloride 500 mL (11/19/20 0640)  . pantoprozole (PROTONIX) infusion 8 mg/hr (11/19/20 1037)  . pantoprazole (PROTONIX) 80 mg IVPB    . piperacillin-tazobactam (ZOSYN)  IV 2.25 g (11/19/20 0641)    Assessment & Plan:   Principal Problem:   Melena Active Problems:   Hypercholesteremia   Hypertension   Crohn's disease ([place])   Type 2 diabetes mellitus with kidney complication, with long-term current use of insulin (HCC)   End stage renal failure on dialysis ([place])   History of CVA (cerebrovascular accident)   Seizure disorder ([place])   Acute metabolic encephalopathy   Anemia in ESRD (end-stage renal disease) ([place])   Chronic systolic CHF (congestive heart failure) ([place])   Melena: Patient has some melena, denies nausea, vomiting, diarrhea.  He has rectal pain, which may be due to history of hidradenitis per his wife.  Patient has fever of 101 in ED.  Does not meet criteria for sepsis.  Since patient has history of Crohn's disease, not sure if patient has Crohn's disease flareup.   GI following, plan for colonoscopy possibly in a.m.  N.p.o. at midnight tonight  Continue IV Zosyn  Crohn's disease: pt is receiving [name] injection weekly Follow GIs recommendation above, signed for  colonoscopy  Hypercholesteremia Continue statins   Hypertension Stable Continue amlodipine, Coreg, lisinopril IV hydralazine as needed    Type 2 diabetes mellitus with kidney complication, with long-term current use of insulin ([place]): Recent A1c 6.7, well controlled.  Patient is taking NovoLog and Lantus -BG stable  Continue Lantus and R-ISS    End stage renal failure on dialysis (MWF):  Hemodialysis today  Nephrology following    History of CVA (cerebrovascular accident) Continue Lipitor  Seizure -Seizure precaution -When necessary Ativan for seizure -Continue Home medications: Keppra  Acute metabolic encephalopathy: Etiology is not clear.  CT head negative.  No focal neuro deficit on physical examination.  Possibly due to ongoing infection -Frequent neuro check  Anemia in ESRD (end-stage renal disease) ([place]) -Follow-up by CBC -Continue iron supplement  Chronic systolic CHF (congestive heart failure) ([place]): 2D echo on 09/13/2020 showed EF of 40-50% with grade 2 diastolic dysfunction.  Patient does not have respiratory distress.  No pulmonary edema on chest x-ray.  CHF is compensated. -Volume management by dialysis per renal        DVT prophylaxis: SCD Code  Status: Full Family Communication: None at bedside  Status is: Inpatient  Remains inpatient appropriate because:Inpatient level of care appropriate due to severity of illness   Dispo: The patient is from: Home              Anticipated d/c is to: Home              Anticipated d/c date is: 2 days              Patient currently is not medically stable to d/c.   Difficult to place patient No patient needs further diagnostic work-up, needs colonoscopy.            LOS: 1 day   Time spent: 35 minutes with more than 50% of COC    Nolberto Hanlon, MD [facility] Pager [phone]  If 7PM-7AM, please contact night-coverage 11/19/2020, 5:06 PM

## 2020-11-19 NOTE — Progress Notes (Signed)
[facility], [place] 11/19/20  Subjective:   LOS: 1 Patient known to our practice from previous admission in 2020 Patient was admitted for lethargy and confusion. He is being prepped for colonoscopy tomorrow. His last dialysis was Monday. Denies any problems during dialysis. He had a temperature of 101 in the ER.  Objective:  Vital signs in last 24 hours:  Temp:  [98.9 F (37.2 C)-101 F (38.3 C)] 99.2 F (37.3 C) (02/02 0739) Pulse Rate:  [59-74] 61 (02/02 0739) Resp:  [15-20] 15 (02/02 0739) BP: (138-183)/(58-109) 152/66 (02/02 0739) SpO2:  [97 %-100 %] 100 % (02/02 0739) Weight:  [60.6 kg] 60.6 kg (02/01 1628)  Weight change:  Filed Weights   11/18/20 1059 11/18/20 1628  Weight: 66.7 kg 60.6 kg    Intake/Output:    Intake/Output Summary (Last 24 hours) at 11/19/2020 1117 Last data filed at 11/18/2020 1800 Gross per 24 hour  Intake 61.64 ml  Output --  Net 61.64 ml    Physical Exam: General:  No acute distress, laying in the bed  HEENT  anicteric, moist oral mucous membrane  Pulm/lungs  normal breathing effort, lungs are clear to auscultation  CVS/Heart  regular rhythm, no rub or gallop  Abdomen:   Soft, nontender  Extremities:  No peripheral edema, bilateral BKA  Neurologic:  Alert, oriented, able to follow commands  Skin:  No acute rashes  Left arm AV fistula  Basic Metabolic Panel:  Recent Labs  Lab 11/18/20 1101 11/19/20 0321  NA 138 138  K 3.6 3.6  CL 95* 97*  CO2 27 26  GLUCOSE 161* 105*  BUN 30* 40*  CREATININE 5.67* 6.99*  CALCIUM 9.3 8.8*     CBC: Recent Labs  Lab 11/18/20 1101 11/18/20 1644 11/18/20 2124 11/19/20 0321 11/19/20 0911  WBC 9.2 7.7 6.7 6.6 7.2  HGB 8.5* 7.5* 7.8* 7.5* 8.0*  HCT 27.5* 24.5* 25.4* 24.1* 25.8*  MCV 84.9 85.1 85.5 84.6 84.6  PLT 128* 111* 108* 104* 106*      Lab Results  Component Value Date   HEPBSAG NON REACTIVE 09/13/2020      Microbiology:  Recent Results (from  the past 240 hour(s))  Blood culture (routine x 2)     Status: None (Preliminary result)   Collection Time: 11/18/20  1:01 PM   Specimen: BLOOD  Result Value Ref Range Status   Specimen Description BLOOD RIGHT ANTECUBITAL  Final   Special Requests   Final    BOTTLES DRAWN AEROBIC AND ANAEROBIC Blood Culture results may not be optimal due to an excessive volume of blood received in culture bottles   Culture   Final    NO GROWTH < 24 HOURS Performed at [facility], [address] [place], [place] [address]    Report Status PENDING  Incomplete  SARS Coronavirus 2 by RT PCR (hospital order, performed in [place] hospital lab) Nasopharyngeal Nasopharyngeal Swab     Status: None   Collection Time: 11/18/20  1:01 PM   Specimen: Nasopharyngeal Swab  Result Value Ref Range Status   SARS Coronavirus 2 NEGATIVE NEGATIVE Final    Comment: (NOTE) SARS-CoV-2 target nucleic acids are NOT DETECTED.  The SARS-CoV-2 RNA is generally detectable in upper and lower respiratory specimens during the acute phase of infection. The lowest concentration of SARS-CoV-2 viral copies this assay can detect is 250 copies / mL. A negative result does not preclude SARS-CoV-2 infection and should not be used as the sole basis  for treatment or other patient management decisions.  A negative result may occur with improper specimen collection / handling, submission of specimen other than nasopharyngeal swab, presence of viral mutation(s) within the areas targeted by this assay, and inadequate number of viral copies (<250 copies / mL). A negative result must be combined with clinical observations, patient history, and epidemiological information.  Fact Sheet for Patients:   StrictlyIdeas.no  Fact Sheet for Healthcare Providers: BankingDealers.co.za  This test is not yet approved or  cleared by the [place] FDA and has been authorized for detection  and/or diagnosis of SARS-CoV-2 by FDA under an Emergency Use Authorization (EUA).  This EUA will remain in effect (meaning this test can be used) for the duration of the COVID-19 declaration under Section 564(b)(1) of the Act, 21 U.S.C. section 360bbb-3(b)(1), unless the authorization is terminated or revoked sooner.  Performed at [facility], [address] [place], [place] [address]   Blood culture (routine x 2)     Status: None (Preliminary result)   Collection Time: 11/18/20  4:42 PM   Specimen: BLOOD  Result Value Ref Range Status   Specimen Description BLOOD LEFT ANTECUBITAL  Final   Special Requests   Final    BOTTLES DRAWN AEROBIC AND ANAEROBIC Blood Culture adequate volume   Culture   Final    NO GROWTH < 24 HOURS Performed at [facility], [address] [place], [place] [address]    Report Status PENDING  Incomplete    Coagulation Studies: Recent Labs    11/18/20 1101  LABPROT 13.6  INR 1.1    Urinalysis: No results for input(s): COLORURINE, LABSPEC, PHURINE, GLUCOSEU, HGBUR, BILIRUBINUR, KETONESUR, PROTEINUR, UROBILINOGEN, NITRITE, LEUKOCYTESUR in the last 72 hours.  Invalid input(s): APPERANCEUR    Imaging: CT ABDOMEN PELVIS WO CONTRAST  Result Date: 11/18/2020 CLINICAL DATA:  Weakness.  Clinical suspicion for diverticulitis. EXAM: CT ABDOMEN AND PELVIS WITHOUT CONTRAST TECHNIQUE: Multidetector CT imaging of the abdomen and pelvis was performed following the standard protocol without IV contrast. COMPARISON:  CT pelvis 12/06/2017, CT chest 05/19/2017 FINDINGS: Lower chest: Right sided pleural thickening and pleural calcification. Mild right basilar subsegmental atelectasis. Mild cardiomegaly. Coronary artery calcification. Hypoattenuation of the cardiac blood pool indicative of anemia. Hepatobiliary: No focal liver abnormality. Small stones layer within the gallbladder lumen. No pericholecystic inflammatory changes by CT. Pancreas:  Unremarkable. No pancreatic ductal dilatation or surrounding inflammatory changes. Spleen: Normal in size without focal abnormality. Adrenals/Urinary Tract: Diffuse bilateral adrenal gland thickening, similar to prior CT. Kidneys are normal in size. No hydronephrosis. Thickened appearance of the urinary bladder wall, which may be accentuated by underdistention. Stomach/Bowel: Stomach within normal limits. No dilated loops of small bowel to suggest obstruction. Normal appendix present in the right lower quadrant (series 2, image 61). Marked circumferential thickening of the distal rectum and anus with suspected open fistula/dehiscence posteriorly (series 2, images 93-94). Adjacent skin and soft tissue thickening in the surrounding soft tissues likely reflecting chronic decubitus ulcer. Prominent rectal stool ball. Remainder of the colon is within normal limits. Vascular/Lymphatic: Marked extensive atherosclerotic calcification including extensive small vessel arterial calcification. No lymphadenopathy evident. Reproductive: Prostate gland within normal limits. Other: No ascites. No abdominopelvic fluid collection. Tiny fat containing umbilical hernia. No pneumoperitoneum. Musculoskeletal: Mild diffusely sclerotic appearance of the osseous structures likely reflecting sequela of renal osteodystrophy. No bony erosions or destructive changes. Multiple scattered Schmorl's nodes within the lumbar spine. Stable sclerotic bone island at the posterior left iliac bone. IMPRESSION: 1.  Marked circumferential thickening of the distal rectum and anus with suspected open fistula/dehiscence posteriorly. Adjacent skin and soft tissue thickening in the surrounding soft tissues likely reflecting chronic decubitus ulcer. Correlate with physical exam. 2. Prominent rectal stool ball. 3. Thickened appearance of the urinary bladder wall, which may be accentuated by underdistention. Correlate with urinalysis to exclude cystitis. 4.  Cholelithiasis without evidence of acute cholecystitis. 5. Marked extensive atherosclerosis. 6. Right sided pleural thickening and pleural calcification. 7. No acute osseous findings or evidence of osteomyelitis. Findings suggestive of renal osteodystrophy. Electronically Signed   By: Davina Poke D.O.   On: 11/18/2020 13:11   DG Chest 1 View  Result Date: 11/18/2020 CLINICAL DATA:  Altered mental status. EXAM: CHEST  1 VIEW COMPARISON:  Chest x-ray dated September 12, 2020. FINDINGS: The heart size and mediastinal contours are within normal limits. Atherosclerotic calcification of the aortic arch. Normal pulmonary vascularity. Chronic mild interstitial coarsening at the lung bases. No focal consolidation, pleural effusion, or pneumothorax. No acute osseous abnormality. Unchanged left axillary stent. IMPRESSION: 1. No active disease. Electronically Signed   By: Titus Dubin M.D.   On: 11/18/2020 12:13   CT Head Wo Contrast  Result Date: 11/18/2020 CLINICAL DATA:  Mental status change. EXAM: CT HEAD WITHOUT CONTRAST TECHNIQUE: Contiguous axial images were obtained from the base of the skull through the vertex without intravenous contrast. COMPARISON:  CT head 09/13/2020 FINDINGS: Brain: No change in chronic left MCA infarct in the frontal lobe and insula. Dystrophic calcification in the infarct. Patchy white matter hypodensity unchanged. Generalized atrophy.  Negative for acute infarct, hemorrhage, mass. Vascular: Negative for hyperdense vessel Skull: Negative Sinuses/Orbits: Negative Other: Motion degraded study IMPRESSION: No acute abnormality Chronic right MCA infarct. Atrophy and chronic microvascular ischemic change in the white matter. Electronically Signed   By: Franchot Gallo M.D.   On: 11/18/2020 12:56     Medications:   . sodium chloride 500 mL (11/19/20 0640)  . pantoprozole (PROTONIX) infusion 8 mg/hr (11/19/20 1037)  . pantoprazole (PROTONIX) 80 mg IVPB    . piperacillin-tazobactam  (ZOSYN)  IV 2.25 g (11/19/20 0641)   . amLODipine  10 mg Oral Daily  . atorvastatin  80 mg Oral Daily  . carvedilol  25 mg Oral BID  . ferric citrate  420 mg Oral BID  . furosemide  80 mg Oral BID  . gabapentin  300 mg Oral QHS  . insulin aspart  0-5 Units Subcutaneous QHS  . insulin aspart  0-9 Units Subcutaneous TID WC  . insulin glargine  7 Units Subcutaneous QHS  . levETIRAcetam  500 mg Oral Daily  . levETIRAcetam  500 mg Oral Q M,W,F-1800  . lisinopril  20 mg Oral Daily  . [START ON 11/22/2020] pantoprazole  40 mg Intravenous Q12H  . polyethylene glycol-electrolytes  4,000 mL Oral Once  . sevelamer carbonate  800 mg Oral TID WC   sodium chloride, acetaminophen, hydrALAZINE, LORazepam, ondansetron  Assessment/ Plan:  60 y.o. male with ESRD on HD MWF followed by [facility] nephrology, diabetes mellitus type 2, history of DVT, bilateral below the knee amputations, hypertension, history of intracerebral hemorrhage, anemia of chronic kidney disease, secondary hyperparathyroidism, seizure disorder    was admitted on 11/18/2020 for  Principal Problem:   Melena Active Problems:   Hypercholesteremia   Hypertension   Crohn's disease ([place])   Type 2 diabetes mellitus with kidney complication, with long-term current use of insulin ([place])   End stage renal failure on dialysis ([place])  History of CVA (cerebrovascular accident)   Seizure disorder ([place])   Acute metabolic encephalopathy   Anemia in ESRD (end-stage renal disease) ([facility])   Chronic systolic CHF (congestive heart failure) ([facility])  Melena [K92.1] Bloody diarrhea [R19.7] Fever, unspecified fever cause [R50.9] Altered mental status, unspecified altered mental status type [R41.82] Rectal bleeding [K62.5]  #. ESRD [facility] nephrology/[address]/MWF. Electrolytes and volume status are acceptable We will schedule routine hemodialysis today  #. Anemia of CKD  Lab Results  Component Value Date   HGB 8.0 (L) 11/19/2020   Low dose EPO  with HD  #. Secondary hyperparathyroidism of renal origin N 25.81      Component Value Date/Time   PTH 283 (H) 01/08/2019 0516   Lab Results  Component Value Date   PHOS 3.8 09/14/2020   Monitor calcium and phos level during this admission   #. Diabetes type 2 with CKD Hemoglobin A1C (%)  Date Value  07/24/2014 8.3 (H)   Hgb A1c MFr Bld (%)  Date Value  09/13/2020 6.7 (H)  Currently treated with Lantus 7 units at bedtime and NovoLog  #Suspected Crohn's disease GI evaluation is ongoing. Marked circumferential thickening of distal rectum and anus with suspected open fistula dehiscence is suspected. Prominent rectal stool ball is noted. Patient previously had refused invasive evaluation including colonoscopy. Now, colonoscopy is planned for tomorrow    LOS: [name] 2/2/202211:17 [place], [place]

## 2020-11-20 ENCOUNTER — Encounter
Admission: EM | Disposition: A | Discharge status: Home Health | Payer: Self-pay | Source: Home / Self Care | Attending: Internal Medicine

## 2020-11-20 ENCOUNTER — Inpatient Hospital Stay: Payer: Medicare Other | Admitting: Certified Registered Nurse Anesthetist

## 2020-11-20 ENCOUNTER — Encounter: Payer: Self-pay | Admitting: Internal Medicine

## 2020-11-20 DIAGNOSIS — K921 Melena: Secondary | ICD-10-CM | POA: Diagnosis not present

## 2020-11-20 HISTORY — PX: ESOPHAGOGASTRODUODENOSCOPY (EGD) WITH PROPOFOL: SHX5813

## 2020-11-20 LAB — CBC
HCT: 27.2 % — ABNORMAL LOW (ref 39.0–52.0)
HCT: 28.8 % — ABNORMAL LOW (ref 39.0–52.0)
Hemoglobin: 8.6 g/dL — ABNORMAL LOW (ref 13.0–17.0)
Hemoglobin: 8.9 g/dL — ABNORMAL LOW (ref 13.0–17.0)
MCH: 26.7 pg (ref 26.0–34.0)
MCH: 26.2 pg (ref 26.0–34.0)
MCHC: 31.6 g/dL (ref 30.0–36.0)
MCHC: 30.9 g/dL (ref 30.0–36.0)
MCV: 84.5 fL (ref 80.0–100.0)
MCV: 84.7 fL (ref 80.0–100.0)
Platelets: 108 10*3/uL — ABNORMAL LOW (ref 150–400)
Platelets: 120 10*3/uL — ABNORMAL LOW (ref 150–400)
RBC: 3.22 MIL/uL — ABNORMAL LOW (ref 4.22–5.81)
RBC: 3.4 MIL/uL — ABNORMAL LOW (ref 4.22–5.81)
RDW: 19 % — ABNORMAL HIGH (ref 11.5–15.5)
RDW: 19.4 % — ABNORMAL HIGH (ref 11.5–15.5)
WBC: 7.7 10*3/uL (ref 4.0–10.5)
WBC: 7.4 10*3/uL (ref 4.0–10.5)
nRBC: 0 % (ref 0.0–0.2)
nRBC: 0 % (ref 0.0–0.2)

## 2020-11-20 LAB — BASIC METABOLIC PANEL
Anion gap: 14 (ref 5–15)
BUN: 25 mg/dL — ABNORMAL HIGH (ref 6–20)
CO2: 25 mmol/L (ref 22–32)
Calcium: 8.6 mg/dL — ABNORMAL LOW (ref 8.9–10.3)
Chloride: 97 mmol/L — ABNORMAL LOW (ref 98–111)
Creatinine, Ser: 4.55 mg/dL — ABNORMAL HIGH (ref 0.61–1.24)
GFR, Estimated: 14 mL/min — ABNORMAL LOW (ref >60)
Glucose, Bld: 71 mg/dL (ref 70–99)
Potassium: 3.8 mmol/L (ref 3.5–5.1)
Sodium: 136 mmol/L (ref 135–145)

## 2020-11-20 LAB — GLUCOSE, CAPILLARY
Glucose-Capillary: 66 mg/dL — ABNORMAL LOW (ref 70–99)
Glucose-Capillary: 88 mg/dL (ref 70–99)
Glucose-Capillary: 169 mg/dL — ABNORMAL HIGH (ref 70–99)
Glucose-Capillary: 139 mg/dL — ABNORMAL HIGH (ref 70–99)
Glucose-Capillary: 121 mg/dL — ABNORMAL HIGH (ref 70–99)
Glucose-Capillary: 116 mg/dL — ABNORMAL HIGH (ref 70–99)

## 2020-11-20 LAB — TYPE AND SCREEN
ABO/RH(D): B POS
Antibody Screen: NEGATIVE
Unit division: 0

## 2020-11-20 LAB — BPAM RBC
Blood Product Expiration Date: 202203032359
ISSUE DATE / TIME: 202202022333
Unit Type and Rh: 7300

## 2020-11-20 SURGERY — ESOPHAGOGASTRODUODENOSCOPY (EGD) WITH PROPOFOL
Anesthesia: General

## 2020-11-20 MED ORDER — PROPOFOL 10 MG/ML IV BOLUS
INTRAVENOUS | Status: AC
  Filled 2020-11-20: qty 20

## 2020-11-20 MED ORDER — PROPOFOL 500 MG/50ML IV EMUL
INTRAVENOUS | Status: DC | PRN
  Administered 2020-11-20: 100 ug/kg/min via INTRAVENOUS

## 2020-11-20 MED ORDER — PEG 3350-KCL-NA BICARB-NACL 420 G PO SOLR
4000.0000 mL | Freq: Once | ORAL | Status: AC
  Administered 2020-11-20: 4000 mL via ORAL
  Filled 2020-11-20: qty 4000

## 2020-11-20 MED ORDER — PROPOFOL 10 MG/ML IV BOLUS
INTRAVENOUS | Status: DC | PRN
  Administered 2020-11-20 (×2): 20 mg via INTRAVENOUS
  Administered 2020-11-20: 50 mg via INTRAVENOUS

## 2020-11-20 MED ORDER — LIDOCAINE HCL (CARDIAC) PF 100 MG/5ML IV SOSY
PREFILLED_SYRINGE | INTRAVENOUS | Status: DC | PRN
  Administered 2020-11-20: 50 mg via INTRAVENOUS

## 2020-11-20 NOTE — Progress Notes (Signed)
Patient is alert, oriented to himself, the place, the date, and why he is in Endoscopy, states "the doctor is going to look down my throat and into my stomach to see what is going on".  Dr Lavone Neri, Anesthesiologist, discussed the anesthesia plan and patient verbalized understanding.  Patient verbalized concern with possibility of nausea/vomiting with the anesthesia and Dr. Lavone Neri informed the patient that he would be observed for any nausea/vomiting or other anesthesia complications and each would be treated accordingly.  Patient verbally agreed to the anesthesia plan.  It is felt that the patient is able to consent to the procedure and sign his own consent.

## 2020-11-20 NOTE — Progress Notes (Signed)
   11/20/20 1600  Assess: MEWS Score  Temp 98 F (36.7 C)  BP (!) 203/78  Pulse Rate 65  ECG Heart Rate 67  Resp 14  SpO2 100 %  O2 Device Room Air   Pt BP is elevated. Dr. Kurtis Bushman notified via secure chat. PRN medication given.

## 2020-11-20 NOTE — Progress Notes (Signed)
PROGRESS NOTE    [name]  YSA:[id number] DOB: 08-08-1961 DOA: 11/18/2020 PCP: [name], MD    Brief Narrative:  [name] is a 60 y.o. male with medical history significant Crohn's Dz, ESRD-HD (MWF), HTN, HLD, DM, stroke, seizure, sCHF, anemia, ICH, PVD, DVT not anticoagulated, bilateral BKA, presents with AMS and rectal pain with bleeding and melena.  2/2-has not had any further melena.  Hematuria persists today. 2/3-EGD today. No cscope as nsg reported he did not finish is prep  Consultants:   Nephrology, GI  Procedures: Hemodialysis CT of abdomen/pelvis: 1. Marked circumferential thickening of the distal rectum and anus with suspected open fistula/dehiscence posteriorly. Adjacent skin and soft tissue thickening in the surrounding soft tissues likely reflecting chronic decubitus ulcer. Correlate with physical exam. 2. Prominent rectal stool ball. 3. Thickened appearance of the urinary bladder wall, which may be accentuated by underdistention. Correlate with urinalysis to exclude cystitis. 4. Cholelithiasis without evidence of acute cholecystitis. 5. Marked extensive atherosclerosis. 6. Right sided pleural thickening and pleural calcification. 7. No acute osseous findings or evidence of osteomyelitis. Findings suggestive of renal osteodystrophy   Antimicrobials:   Zosyn   Subjective: Has no complaints this am . No cp, sob, abd pain. No melena reported. Wants to wear his prosthesis to use the bathroom   Objective: Vitals:   11/20/20 1313 11/20/20 1323 11/20/20 1357 11/20/20 1600  BP: (!) 178/72 (!) 178/73 (!) 187/73 (!) 203/78  Pulse: (!) 59 (!) 59 (!) 58 65  Resp: 14 13 18    Temp:   98 F (36.7 C) 98 F (36.7 C)  TempSrc:   Oral Oral  SpO2: 99% 99% 100% 100%  Weight:      Height:        Intake/Output Summary (Last 24 hours) at 11/20/2020 1607 Last data filed at 11/20/2020 1500 Gross per 24 hour  Intake 1700.42 ml  Output --  Net 1700.42  ml   Filed Weights   11/18/20 1059 11/18/20 1628  Weight: 66.7 kg 60.6 kg    Examination:  Calm, comfortable CTA no wheeze rales rhonchi's Regular S1-S2 no gallops Soft benign positive bowel sounds Bilateral BKA Alert oriented x3, grossly intact Mood and affect appropriate in current setting    Data Reviewed: I have personally reviewed following labs and imaging studies  CBC: Recent Labs  Lab 11/19/20 0321 11/19/20 0911 11/19/20 1822 11/20/20 0416 11/20/20 0932  WBC 6.6 7.2 7.2 7.7 7.4  HGB 7.5* 8.0* 7.0* 8.6* 8.9*  HCT 24.1* 25.8* 22.9* 27.2* 28.8*  MCV 84.6 84.6 85.4 84.5 84.7  PLT 104* 106* 98* 108* 323*   Basic Metabolic Panel: Recent Labs  Lab 11/18/20 1101 11/19/20 0321 11/20/20 0416  NA 138 138 136  K 3.6 3.6 3.8  CL 95* 97* 97*  CO2 27 26 25   GLUCOSE 161* 105* 71  BUN 30* 40* 25*  CREATININE 5.67* 6.99* 4.55*  CALCIUM 9.3 8.8* 8.6*   GFR: Estimated Creatinine Clearance: 15.2 mL/min (A) (by C-G formula based on SCr of 4.55 mg/dL (H)). Liver Function Tests: No results for input(s): AST, ALT, ALKPHOS, BILITOT, PROT, ALBUMIN in the last 168 hours. No results for input(s): LIPASE, AMYLASE in the last 168 hours. No results for input(s): AMMONIA in the last 168 hours. Coagulation Profile: Recent Labs  Lab 11/18/20 1101  INR 1.1   Cardiac Enzymes: No results for input(s): CKTOTAL, CKMB, CKMBINDEX, TROPONINI in the last 168 hours. BNP (last 3 results) No results for input(s): PROBNP  in the last 8760 hours. HbA1C: No results for input(s): HGBA1C in the last 72 hours. CBG: Recent Labs  Lab 11/19/20 2102 11/20/20 0747 11/20/20 0827 11/20/20 1111 11/20/20 1427  GLUCAP 78 66* 88 169* 139*   Lipid Profile: No results for input(s): CHOL, HDL, LDLCALC, TRIG, CHOLHDL, LDLDIRECT in the last 72 hours. Thyroid Function Tests: No results for input(s): TSH, T4TOTAL, FREET4, T3FREE, THYROIDAB in the last 72 hours. Anemia Panel: No results for  input(s): VITAMINB12, FOLATE, FERRITIN, TIBC, IRON, RETICCTPCT in the last 72 hours. Sepsis Labs: Recent Labs  Lab 11/18/20 1125  LATICACIDVEN 1.0    Recent Results (from the past 240 hour(s))  Blood culture (routine x 2)     Status: None (Preliminary result)   Collection Time: 11/18/20  1:01 PM   Specimen: BLOOD  Result Value Ref Range Status   Specimen Description BLOOD RIGHT ANTECUBITAL  Final   Special Requests   Final    BOTTLES DRAWN AEROBIC AND ANAEROBIC Blood Culture results may not be optimal due to an excessive volume of blood received in culture bottles   Culture   Final    NO GROWTH 2 DAYS Performed at [facility], [address], [place], [place] [address]    Report Status PENDING  Incomplete  SARS Coronavirus 2 by RT PCR (hospital order, performed in [place] hospital lab) Nasopharyngeal Nasopharyngeal Swab     Status: None   Collection Time: 11/18/20  1:01 PM   Specimen: Nasopharyngeal Swab  Result Value Ref Range Status   SARS Coronavirus 2 NEGATIVE NEGATIVE Final    Comment: (NOTE) SARS-CoV-2 target nucleic acids are NOT DETECTED.  The SARS-CoV-2 RNA is generally detectable in upper and lower respiratory specimens during the acute phase of infection. The lowest concentration of SARS-CoV-2 viral copies this assay can detect is 250 copies / mL. A negative result does not preclude SARS-CoV-2 infection and should not be used as the sole basis for treatment or other patient management decisions.  A negative result may occur with improper specimen collection / handling, submission of specimen other than nasopharyngeal swab, presence of viral mutation(s) within the areas targeted by this assay, and inadequate number of viral copies (<250 copies / mL). A negative result must be combined with clinical observations, patient history, and epidemiological information.  Fact Sheet for Patients:   StrictlyIdeas.no  Fact Sheet for  Healthcare Providers: BankingDealers.co.za  This test is not yet approved or  cleared by the [place] FDA and has been authorized for detection and/or diagnosis of SARS-CoV-2 by FDA under an Emergency Use Authorization (EUA).  This EUA will remain in effect (meaning this test can be used) for the duration of the COVID-19 declaration under Section 564(b)(1) of the Act, 21 U.S.C. section 360bbb-3(b)(1), unless the authorization is terminated or revoked sooner.  Performed at [facility], [address] [place], [place] [address]   Blood culture (routine x 2)     Status: None (Preliminary result)   Collection Time: 11/18/20  4:42 PM   Specimen: BLOOD  Result Value Ref Range Status   Specimen Description BLOOD LEFT ANTECUBITAL  Final   Special Requests   Final    BOTTLES DRAWN AEROBIC AND ANAEROBIC Blood Culture adequate volume   Culture   Final    NO GROWTH 2 DAYS Performed at [facility], [address] [place], [place] [address]    Report Status PENDING  Incomplete         Radiology Studies:  No results found.      Scheduled Meds: . amLODipine  10 mg Oral Daily  . atorvastatin  80 mg Oral Daily  . carvedilol  25 mg Oral BID  . Chlorhexidine Gluconate Cloth  6 each Topical Q0600  . epoetin (EPOGEN/PROCRIT) injection  4,000 Units Intravenous Q M,W,F-HD  . ferric citrate  420 mg Oral BID  . furosemide  80 mg Oral BID  . gabapentin  300 mg Oral QHS  . insulin aspart  0-5 Units Subcutaneous QHS  . insulin aspart  0-9 Units Subcutaneous TID WC  . insulin glargine  7 Units Subcutaneous QHS  . levETIRAcetam  500 mg Oral Daily  . levETIRAcetam  500 mg Oral Q M,W,F-1800  . lisinopril  20 mg Oral Daily  . [START ON 11/22/2020] pantoprazole  40 mg Intravenous Q12H  . sevelamer carbonate  800 mg Oral TID WC   Continuous Infusions: . sodium chloride 10 mL/hr at 11/20/20 1216  . pantoprozole (PROTONIX) infusion 8 mg/hr (11/20/20  0942)  . pantoprazole (PROTONIX) 80 mg IVPB    . piperacillin-tazobactam (ZOSYN)  IV 2.25 g (11/20/20 0948)    Assessment & Plan:   Principal Problem:   Melena Active Problems:   Hypercholesteremia   Hypertension   Crohn's disease ([place])   Type 2 diabetes mellitus with kidney complication, with long-term current use of insulin (HCC)   End stage renal failure on dialysis ([place])   History of CVA (cerebrovascular accident)   Seizure disorder ([place])   Acute metabolic encephalopathy   Anemia in ESRD (end-stage renal disease) ([place])   Chronic systolic CHF (congestive heart failure) ([place])   Melena: Patient has some melena, denies nausea, vomiting, diarrhea.  He has rectal pain, which may be due to history of hidradenitis per his wife.  Patient has fever of 101 in ED.  Does not meet criteria for sepsis.  Since patient has history of Crohn's disease, not sure if patient has Crohn's disease flareup.   2/3-patient did not finish prep, colonoscopy for tomorrow  Status post EGD this afternoon found with duodenitis and a small hiatal hernia  GI following  Continue IV Zosyn    Crohn's disease:  Patient on weekly [name] injection  Plan for colonoscopy in a.m.    Hypercholesteremia Continue statins   Hypertension Overall stable Continue amlodipine, Coreg, lisinopril  Continue IV hydralazine as needed     Type 2 diabetes mellitus with kidney complication, with long-term current use of insulin ([place]): Recent A1c 6.7, well controlled.  Patient is taking NovoLog and Lantus -BG stable  Continue Lantus and R-ISS    End stage renal failure on dialysis (MWF):  Nephrology following  Hemodialysis in a.m.   History of CVA (cerebrovascular accident) Continue Lipitor  Seizure -Seizure precaution -When necessary Ativan for seizure -Continue Home medications: Keppra  Acute metabolic encephalopathy: Etiology is not clear.  CT head negative.  No focal neuro deficit on physical  examination.  Possibly due to ongoing infection 2/3-spoke to wife who is concerned about patient being confused in the evening and reports he is not at his baseline.  Although both times I have seen him in the morning patient is appropriate and not confused.  Wife would like neurology to be consulted. After much discussion we will obtain MRI of the brain as patient's wife states patient could not get up on the day of admission and she was scared that he had a stroke. Neurology consulted   Anemia in ESRD (end-stage renal disease) ([place]) -  Follow-up by CBC -Continue iron supplement  Chronic systolic CHF (congestive heart failure) ([place]): 2D echo on 09/13/2020 showed EF of 40-50% with grade 2 diastolic dysfunction.  Patient does not have respiratory distress.  No pulmonary edema on chest x-ray.  CHF is compensated. -Volume management by dialysis per renal        DVT prophylaxis: SCD Code Status: Full Family Communication: None at bedside  Status is: Inpatient  Remains inpatient appropriate because:Inpatient level of care appropriate due to severity of illness   Dispo: The patient is from: Home              Anticipated d/c is to: Home              Anticipated d/c date is: 2 days              Patient currently is not medically stable to d/c.   Difficult to place patient No patient needs further diagnostic work-up, needs colonoscopy.  Needs MRI of the brain, neurology consult            LOS: 2 days   Time spent: 35 minutes with more than 50% of COC    Nolberto Hanlon, MD [facility] Pager [phone]  If 7PM-7AM, please contact night-coverage 11/20/2020, 4:07 PM

## 2020-11-20 NOTE — Progress Notes (Signed)
   11/20/20 1752  Assess: MEWS Score  Temp 98.3 F (36.8 C)  BP (!) 208/81  Pulse Rate 70  Resp 20  SpO2 96 %  O2 Device Room Air   Pt BP elevated. Dr. Kurtis Bushman notified via secure chat.

## 2020-11-20 NOTE — Transfer of Care (Signed)
Immediate Anesthesia Transfer of Care Note  Patient: [name]  Procedure(s) Performed: ESOPHAGOGASTRODUODENOSCOPY (EGD) WITH PROPOFOL (N/A )  Patient Location: Endoscopy Unit  Anesthesia Type:General  Level of Consciousness: awake, alert  and oriented  Airway & Oxygen Therapy: Patient Spontanous Breathing  Post-op Assessment: Report given to RN and Post -op Vital signs reviewed and stable  Post vital signs: Reviewed and stable  Last Vitals:  Vitals Value Taken Time  BP 172/76 11/20/20 1255  Temp    Pulse 60 11/20/20 1255  Resp 16 11/20/20 1255  SpO2 99 % 11/20/20 1255  Vitals shown include unvalidated device data.  Last Pain:  Vitals:   11/20/20 1109  TempSrc: Temporal  PainSc: 0-No pain         Complications: No complications documented.

## 2020-11-20 NOTE — Progress Notes (Signed)
Inpatient Diabetes Program Recommendations  AACE/ADA: New Consensus Statement on Inpatient Glycemic Control (2015)  Target Ranges:  Prepandial:   less than 140 mg/dL      [place] postprandial:   less than 180 mg/dL (1-2 hours)      Critically ill patients:  140 - 180 mg/dL   Lab Results  Component Value Date   GLUCAP 169 (H) 11/20/2020   HGBA1C 6.7 (H) 09/13/2020   Results for Nicastro, MALIKAH LAKEY (MRN [id number]) as of 11/20/2020 12:52  Ref. Range 11/19/2020 18:14 11/19/2020 21:02 11/20/2020 07:47 11/20/2020 08:27 11/20/2020 11:11  Glucose-Capillary Latest Ref Range: 70 - 99 mg/dL 81 78 66 (L) 88 169 (H)   Review of Glycemic Control  Diabetes history: type 2 Outpatient Diabetes medications: Lantus 7 units at HS, Novolog 3 units TID Current orders for Inpatient glycemic control: Lantus 7 units at HS, Novolog SENSITIVE correction scale TID & HS.  Inpatient Diabetes Program Recommendations:    Noted that blood sugars have been less than 70 mg/dl due to patient being NPO. If blood sugars continue to be low, recommend discontinuing the Lantus 7 units at HS and continuing the Novolog SENSITIVE correction scale TID and 0-5 units HS scale.   Harvel Ricks RN BSN CDE Diabetes Coordinator Pager: [phone]  8am-5pm

## 2020-11-20 NOTE — TOC Initial Note (Signed)
Transition of Care [facility]) - Initial/Assessment Note    Patient Details  Name: [name] MRN: [id number] Date of Birth: May 26, 1961  Transition of [facility]) CM/SW Contact:    Beverly Sessions, RN Phone Number: 11/20/2020, 1:55 PM  Clinical Narrative:                 Patient admitted from home with melena Patient with AMS Assessment completed via phone with wife  PCP Caryn Section - wife transports Outpatient HD - [facility] transports Pharmacy [facility] - denies issues obtaining medications  History of bilateral amputation.  Patient has his prosthesis in the hospital room.  Has WC and RW in the home  Patient has had [facility] in the past.  Requested MD to order PT eval If HH recommended they would like to use [facility] again If SNF recommended wife agreeable but not interested in [facility] in [place]     Expected Discharge Plan: [place] Barriers to Discharge: Continued Medical Work up   Patient Goals and CMS Choice        Expected Discharge Plan and Services Expected Discharge Plan: [place]   Discharge Planning Services: CM Consult   Living arrangements for the past 2 months: Single Family Home                                      Prior Living Arrangements/Services Living arrangements for the past 2 months: Single Family Home Lives with:: Spouse                   Activities of Daily Living Home Assistive Devices/Equipment: Prosthesis,Walker (specify type) ADL Screening (condition at time of admission) Patient's cognitive ability adequate to safely complete daily activities?: Yes Is the patient deaf or have difficulty hearing?: No Does the patient have difficulty seeing, even when wearing glasses/contacts?: No Does the patient have difficulty concentrating, remembering, or making decisions?: Yes Patient able to express need for assistance with ADLs?: Yes Does the patient have difficulty dressing or bathing?:  Yes Independently performs ADLs?: Yes (appropriate for developmental age) Does the patient have difficulty walking or climbing stairs?: Yes Weakness of Legs: Both Weakness of Arms/Hands: None  Permission Sought/Granted                  Emotional Assessment              Admission diagnosis:  Melena [K92.1] Bloody diarrhea [R19.7] Fever, unspecified fever cause [R50.9] Altered mental status, unspecified altered mental status type [R41.82] Rectal bleeding [K62.5] Patient Active Problem List   Diagnosis Date Noted  . Acute metabolic encephalopathy 73/22/0254  . Anemia in ESRD (end-stage renal disease) ([place]) 11/18/2020  . Chronic systolic CHF (congestive heart failure) ([place]) 11/18/2020  . Melena   . Screening for colon cancer   . B12 deficiency 10/12/2020  . CVA (cerebral vascular accident) ([place]) 09/12/2020  . AMS (altered mental status) 09/12/2020  . Hyperkalemia 09/12/2020  . Mild cognitive impairment 09/30/2019  . Anemia 01/05/2019  . Peripheral vascular disease ([place]) 06/28/2018  . Pressure injury of skin 12/04/2017  . S/P bilateral BKA (below knee amputation) ([place]) 10/20/2017  . Atherosclerosis of native arteries of extremity with rest pain ([place]) 08/05/2017  . History of CVA (cerebrovascular accident) 04/15/2017  . Seizure disorder ([place]) 04/15/2017  . End stage renal failure on dialysis ([place]) 04/12/2016  . Aphthae 02/20/2016  .  Hidradenitis 02/20/2016  . Leg pain 02/20/2016  . Neuropathy 02/20/2016  . Narrowing of intervertebral disc space 08/29/2015  . Vascular disorder of lower extremity 08/29/2015  . Failure of erection 08/29/2015  . Hypercholesteremia 08/29/2015  . Hypertension 08/29/2015  . Anemia due to chronic kidney disease 05/26/2015  . Venous insufficiency of leg 09/04/2014  . History of deep vein thrombosis (DVT) of lower extremity 08/16/2014  . Prostatic intraepithelial neoplasia 11/02/2013  . Elevated prostate specific antigen (PSA) 09/11/2013   . Benign prostatic hyperplasia with urinary obstruction 08/13/2013  . Spermatocele 08/13/2013  . Avitaminosis D 01/25/2013  . Type 2 diabetes mellitus with kidney complication, with long-term current use of insulin ([place]) 03/28/2012  . Crohn's disease ([place]) 08/03/2011   PCP:  [name], MD Pharmacy:   [facility] [phone] - [address], [address] [place] [address] Phone: [phone] Fax: [phone]     Social Determinants of Health (SDOH) Interventions    Readmission Risk Interventions Readmission Risk Prevention Plan 11/20/2020  [facility] or Home Care Consult Complete  Palliative Care Screening Not Applicable  Medication Review (RN Care Manager) Complete  Some recent data might be hidden

## 2020-11-20 NOTE — Op Note (Addendum)
[facility] Gastroenterology Patient Name: [name] Procedure Date: 11/20/2020 12:16 PM MRN: [id number] Account #: [id number] Date of Birth: [birth year] Admit Type: Inpatient Age: 60 Room: [facility] ENDO ROOM 4 Gender: Male Note Status: Finalized Procedure:             Upper GI endoscopy Indications:           Melena Providers:             [name] MD, MD Referring MD:          [name]. Caryn Section, MD (Referring MD) Medicines:             Propofol per Anesthesia Complications:         No immediate complications. Procedure:             Pre-Anesthesia Assessment:                        - Prior to the procedure, a History and Physical was                         performed, and patient medications and allergies were                         reviewed. The patient's tolerance of previous                         anesthesia was also reviewed. The risks and benefits                         of the procedure and the sedation options and risks                         were discussed with the patient. All questions were                         answered, and informed consent was obtained. Prior                         Anticoagulants: The patient has taken no previous                         anticoagulant or antiplatelet agents. ASA Grade                         Assessment: III - A patient with severe systemic                         disease. After reviewing the risks and benefits, the                         patient was deemed in satisfactory condition to                         undergo the procedure.                        After obtaining informed consent, the endoscope was  passed under direct vision. Throughout the procedure,                         the patient's blood pressure, pulse, and oxygen                         saturations were monitored continuously. The Endoscope                         was introduced through the mouth, and advanced to the                          second part of duodenum. The upper GI endoscopy was                         accomplished without difficulty. The patient tolerated                         the procedure well. Findings:      A small hiatal hernia was present.      The stomach was normal.      Localized mild inflammation characterized by erythema was found in the       duodenal bulb. Impression:            - Small hiatal hernia.                        - Normal stomach.                        - Duodenitis.                        - No specimens collected. Recommendation:        - Return patient to hospital ward for ongoing care.                        - Clear liquid diet.                        - Continue present medications.                        - Perform a colonoscopy tomorrow. Procedure Code(s):     --- Professional ---                        [phone], Esophagogastroduodenoscopy, flexible,                         transoral; diagnostic, including collection of                         specimen(s) by brushing or washing, when performed                         (separate procedure) Diagnosis Code(s):     --- Professional ---                        K92.1, Melena (includes Hematochezia)  K29.80, Duodenitis without bleeding CPT copyright 2019 American Medical Association. All rights reserved. The codes documented in this report are preliminary and upon coder review may  be revised to meet current compliance requirements. [name] MD, MD 11/20/2020 12:45:34 PM This report has been signed electronically. Number of Addenda: 0 Note Initiated On: 11/20/2020 12:16 PM Estimated Blood Loss:  Estimated blood loss: none.      [facility]

## 2020-11-20 NOTE — Progress Notes (Addendum)
Pt is back in room 218. Pt keeps saying that he is sitting out in the hall and no one is watching him. Tried to explain to the patient that he is back in the room that he was before going down to the procedure. Pt stated that he is not. Pt stated that no one will listen to him about being in the hall. Pt is more confused then when he left for the EGD. Dr. Kurtis Bushman notified via secure chat.

## 2020-11-20 NOTE — Progress Notes (Signed)
Pt bs was 66. Gave pt 240 mL of apple juice. Pt bs went up to 88. Dr. Kurtis Bushman notified via secure chat.

## 2020-11-20 NOTE — Anesthesia Preprocedure Evaluation (Signed)
Anesthesia Evaluation  Patient identified by MRN, date of birth, ID band Patient awake    Reviewed: Allergy & Precautions, H&P , NPO status , reviewed documented beta blocker date and time   Airway Mallampati: II  TM Distance: >3 FB Neck ROM: full    Dental  (+) Missing, Teeth Intact   Pulmonary    Pulmonary exam normal        Cardiovascular hypertension, + Peripheral Vascular Disease and +CHF  Normal cardiovascular exam     Neuro/Psych Seizures -,  CVA, No Residual Symptoms    GI/Hepatic Rare GERD   Endo/Other  diabetes  Renal/GU ESRF and DialysisRenal disease     Musculoskeletal  (+) Arthritis ,   Abdominal   Peds  Hematology  (+) Blood dyscrasia, anemia ,   Anesthesia Other Findings Past Medical History: No date: Crohn disease ([place]) No date: Diabetes mellitus without complication ([place]) 1751: DVT of lower extremity (deep venous thrombosis) ([place]) 05/20/2017: Empyema ([place]) 12/04/2017: Encephalopathy 09/12/2020: Fall at home, initial encounter No date: Hidradenitis suppurativa No date: Hypertension No date: ICH (intracerebral hemorrhage) ([place]) 04/21/2017: Peritonitis ([place]) 02/04/2016: Pyogenic arthritis of knee ([place]) No date: Renal disorder 01/12/2018: Sepsis ([place]) No date: Stroke [facility])  Past Surgical History: No date: ABDOMINAL SURGERY 06/2019: AMPUTATION FINGER; Left     Comment:  PR AMPUTATION LONG FINGER/THUMB+[facility] No date: ANGIOPLASTY; Left     Comment:  left fem-pop at [facility] 04-11-2018 08/2017: BELOW KNEE LEG AMPUTATION; Right     Comment:  [facility] No date: COLONOSCOPY 10/28/2020: COLONOSCOPY WITH PROPOFOL; N/A     Comment:  Procedure: COLONOSCOPY WITH PROPOFOL;  Surgeon: Lin Landsman, MD;  Location: [place];  Service:               Gastroenterology;  Laterality: N/A; 12/09/2017: DIALYSIS/PERMA CATHETER INSERTION; N/A     Comment:  Procedure: DIALYSIS/PERMA CATHETER  INSERTION;  Surgeon:               Katha Cabal, MD;  Location: [place] CV LAB;               Service: Cardiovascular;  Laterality: N/A; 12/12/2017: DIALYSIS/PERMA CATHETER INSERTION; N/A     Comment:  Procedure: DIALYSIS/PERMA CATHETER INSERTION;  Surgeon:               Algernon Huxley, MD;  Location: [place] CV LAB;                Service: Cardiovascular;  Laterality: N/A; 12/09/2017: DIALYSIS/PERMA CATHETER REMOVAL; Left     Comment:  Procedure: DIALYSIS/PERMA CATHETER REMOVAL;  Surgeon:               Katha Cabal, MD;  Location: [employer] CV LAB;               Service: Cardiovascular;  Laterality: Left; 02/04/2016: KNEE SURGERY; Left     Comment:  [facility] 06/22/2018: LEG AMPUTATION THROUGH LOWER TIBIA AND FIBULA; Left     Comment:  [facility] 08/08/2017: LOWER EXTREMITY ANGIOGRAPHY; Right     Comment:  Procedure: Lower Extremity Angiography;  Surgeon: Algernon Huxley, MD;  Location: [place] CV LAB;  Service:               Cardiovascular;  Laterality: Right; 08/22/2017: LOWER EXTREMITY ANGIOGRAPHY; Right     Comment:  Procedure: Lower Extremity Angiography;  Surgeon: Algernon Huxley, MD;  Location: [place] CV LAB;  Service:               Cardiovascular;  Laterality: Right; 08/08/2017: LOWER EXTREMITY INTERVENTION     Comment:  Procedure: LOWER EXTREMITY INTERVENTION;  Surgeon: Algernon Huxley, MD;  Location: [place] CV LAB;  Service:               Cardiovascular;; 08/22/2017: LOWER EXTREMITY INTERVENTION     Comment:  Procedure: LOWER EXTREMITY INTERVENTION;  Surgeon: Algernon Huxley, MD;  Location: [place] CV LAB;  Service:               Cardiovascular;;  BMI    Body Mass Index: 18.63 kg/m      Reproductive/Obstetrics                             Anesthesia Physical Anesthesia Plan  ASA: IV  Anesthesia Plan: General   Post-op Pain Management:    Induction:  Intravenous  PONV Risk Score and Plan: 2 and TIVA  Airway Management Planned: Nasal Cannula and Natural Airway  Additional Equipment:   Intra-op Plan:   Post-operative Plan:   Informed Consent: I have reviewed the patients History and Physical, chart, labs and discussed the procedure including the risks, benefits and alternatives for the proposed anesthesia with the patient or authorized representative who has indicated his/her understanding and acceptance.     Dental Advisory Given  Plan Discussed with: CRNA  Anesthesia Plan Comments:         Anesthesia Quick Evaluation

## 2020-11-21 ENCOUNTER — Inpatient Hospital Stay: Payer: Medicare Other | Admitting: Anesthesiology

## 2020-11-21 ENCOUNTER — Encounter
Admission: EM | Disposition: A | Discharge status: Home Health | Payer: Self-pay | Source: Home / Self Care | Attending: Internal Medicine

## 2020-11-21 ENCOUNTER — Other Ambulatory Visit: Payer: Self-pay | Admitting: Neurology

## 2020-11-21 ENCOUNTER — Inpatient Hospital Stay: Payer: Medicare Other

## 2020-11-21 ENCOUNTER — Encounter: Payer: Self-pay | Admitting: Internal Medicine

## 2020-11-21 DIAGNOSIS — R410 Disorientation, unspecified: Secondary | ICD-10-CM | POA: Diagnosis not present

## 2020-11-21 DIAGNOSIS — R419 Unspecified symptoms and signs involving cognitive functions and awareness: Secondary | ICD-10-CM | POA: Diagnosis not present

## 2020-11-21 DIAGNOSIS — K921 Melena: Secondary | ICD-10-CM | POA: Diagnosis not present

## 2020-11-21 DIAGNOSIS — K635 Polyp of colon: Secondary | ICD-10-CM | POA: Diagnosis not present

## 2020-11-21 HISTORY — PX: COLONOSCOPY WITH PROPOFOL: SHX5780

## 2020-11-21 LAB — AMMONIA: Ammonia: 9 umol/L (ref 9–35)

## 2020-11-21 LAB — GLUCOSE, CAPILLARY
Glucose-Capillary: 123 mg/dL — ABNORMAL HIGH (ref 70–99)
Glucose-Capillary: 109 mg/dL — ABNORMAL HIGH (ref 70–99)
Glucose-Capillary: 79 mg/dL (ref 70–99)

## 2020-11-21 LAB — VITAMIN B12: Vitamin B-12: 1099 pg/mL — ABNORMAL HIGH (ref 180–914)

## 2020-11-21 LAB — TSH: TSH: 3.86 u[IU]/mL (ref 0.350–4.500)

## 2020-11-21 LAB — PHOSPHORUS: Phosphorus: 3.7 mg/dL (ref 2.5–4.6)

## 2020-11-21 SURGERY — COLONOSCOPY WITH PROPOFOL
Anesthesia: General

## 2020-11-21 MED ORDER — PROPOFOL 10 MG/ML IV BOLUS
INTRAVENOUS | Status: AC
  Filled 2020-11-21: qty 20

## 2020-11-21 MED ORDER — PHENYLEPHRINE HCL (PRESSORS) 10 MG/ML IV SOLN
INTRAVENOUS | Status: DC | PRN
  Administered 2020-11-21: 50 ug via INTRAVENOUS

## 2020-11-21 MED ORDER — EPHEDRINE SULFATE 50 MG/ML IJ SOLN
INTRAMUSCULAR | Status: DC | PRN
  Administered 2020-11-21 (×4): 5 mg via INTRAVENOUS

## 2020-11-21 MED ORDER — PROPOFOL 500 MG/50ML IV EMUL
INTRAVENOUS | Status: DC | PRN
  Administered 2020-11-21: 140 ug/kg/min via INTRAVENOUS

## 2020-11-21 MED ORDER — PROPOFOL 500 MG/50ML IV EMUL
INTRAVENOUS | Status: AC
  Filled 2020-11-21: qty 50

## 2020-11-21 MED ORDER — PROPOFOL 10 MG/ML IV BOLUS
INTRAVENOUS | Status: DC | PRN
  Administered 2020-11-21: 30 mg via INTRAVENOUS
  Administered 2020-11-21: 20 mg via INTRAVENOUS
  Administered 2020-11-21: 10 mg via INTRAVENOUS
  Administered 2020-11-21 (×2): 20 mg via INTRAVENOUS
  Administered 2020-11-21: 40 mg via INTRAVENOUS

## 2020-11-21 MED ORDER — SODIUM CHLORIDE 0.9 % IV SOLN: INTRAVENOUS | Status: DC

## 2020-11-21 MED ORDER — CYANOCOBALAMIN 1000 MCG/ML IJ SOLN
1000.0000 ug | Freq: Once | INTRAMUSCULAR | Status: DC
  Filled 2020-11-21: qty 1

## 2020-11-21 MED ORDER — LIDOCAINE HCL (CARDIAC) PF 100 MG/5ML IV SOSY
PREFILLED_SYRINGE | INTRAVENOUS | Status: DC | PRN
  Administered 2020-11-21: 60 mg via INTRAVENOUS

## 2020-11-21 NOTE — Anesthesia Preprocedure Evaluation (Signed)
Anesthesia Evaluation  Patient identified by MRN, date of birth, ID band Patient awake    Reviewed: Allergy & Precautions, H&P , NPO status , reviewed documented beta blocker date and time   History of Anesthesia Complications Negative for: history of anesthetic complications  Airway Mallampati: II  TM Distance: >3 FB Neck ROM: full    Dental  (+) Missing, Teeth Intact   Pulmonary neg pulmonary ROS, neg shortness of breath,    Pulmonary exam normal        Cardiovascular hypertension, (-) angina+ Peripheral Vascular Disease and +CHF  Normal cardiovascular exam     Neuro/Psych Seizures -,  AMS with worse confusion today.  Cleared by neurology. CVA, No Residual Symptoms    GI/Hepatic Rare GERD   Endo/Other  diabetes  Renal/GU ESRF and DialysisRenal disease     Musculoskeletal  (+) Arthritis ,   Abdominal   Peds  Hematology  (+) Blood dyscrasia, anemia ,   Anesthesia Other Findings Past Medical History: No date: Crohn disease ([place]) No date: Diabetes mellitus without complication ([place]) 1610: DVT of lower extremity (deep venous thrombosis) ([place]) 05/20/2017: Empyema ([place]) 12/04/2017: Encephalopathy 09/12/2020: Fall at home, initial encounter No date: Hidradenitis suppurativa No date: Hypertension No date: ICH (intracerebral hemorrhage) ([place]) 04/21/2017: Peritonitis ([place]) 02/04/2016: Pyogenic arthritis of knee ([place]) No date: Renal disorder 01/12/2018: Sepsis ([place]) No date: Stroke [facility])  Past Surgical History: No date: ABDOMINAL SURGERY 06/2019: AMPUTATION FINGER; Left     Comment:  PR AMPUTATION LONG FINGER/THUMB+FLAPSUNC No date: ANGIOPLASTY; Left     Comment:  left fem-pop [facility] 04-11-2018 08/2017: BELOW KNEE LEG AMPUTATION; Right     Comment:  [facility] No date: COLONOSCOPY 10/28/2020: COLONOSCOPY WITH PROPOFOL; N/A     Comment:  Procedure: COLONOSCOPY WITH PROPOFOL;  Surgeon: Lin Landsman,  MD;  Location: [place];  Service:               Gastroenterology;  Laterality: N/A; 12/09/2017: DIALYSIS/PERMA CATHETER INSERTION; N/A     Comment:  Procedure: DIALYSIS/PERMA CATHETER INSERTION;  Surgeon:               Katha Cabal, MD;  Location: [place] CV LAB;               Service: Cardiovascular;  Laterality: N/A; 12/12/2017: DIALYSIS/PERMA CATHETER INSERTION; N/A     Comment:  Procedure: DIALYSIS/PERMA CATHETER INSERTION;  Surgeon:               Algernon Huxley, MD;  Location: [place] CV LAB;                Service: Cardiovascular;  Laterality: N/A; 12/09/2017: DIALYSIS/PERMA CATHETER REMOVAL; Left     Comment:  Procedure: DIALYSIS/PERMA CATHETER REMOVAL;  Surgeon:               Katha Cabal, MD;  Location: [place] CV LAB;               Service: Cardiovascular;  Laterality: Left; 02/04/2016: KNEE SURGERY; Left     Comment:  [facility] 06/22/2018: LEG AMPUTATION THROUGH LOWER TIBIA AND FIBULA; Left     Comment:  [facility] 08/08/2017: LOWER EXTREMITY ANGIOGRAPHY; Right     Comment:  Procedure: Lower Extremity Angiography;  Surgeon: Algernon Huxley, MD;  Location: [place] CV LAB;  Service:  Cardiovascular;  Laterality: Right; 08/22/2017: LOWER EXTREMITY ANGIOGRAPHY; Right     Comment:  Procedure: Lower Extremity Angiography;  Surgeon: Algernon Huxley, MD;  Location: [place] CV LAB;  Service:               Cardiovascular;  Laterality: Right; 08/08/2017: LOWER EXTREMITY INTERVENTION     Comment:  Procedure: LOWER EXTREMITY INTERVENTION;  Surgeon: Algernon Huxley, MD;  Location: [place] CV LAB;  Service:               Cardiovascular;; 08/22/2017: LOWER EXTREMITY INTERVENTION     Comment:  Procedure: LOWER EXTREMITY INTERVENTION;  Surgeon: Algernon Huxley, MD;  Location: [place] CV LAB;  Service:               Cardiovascular;;  BMI    Body Mass Index: 18.63 kg/m       Reproductive/Obstetrics                             Anesthesia Physical  Anesthesia Plan  ASA: IV  Anesthesia Plan: General   Post-op Pain Management:    Induction: Intravenous  PONV Risk Score and Plan: 2 and TIVA  Airway Management Planned: Nasal Cannula and Natural Airway  Additional Equipment:   Intra-op Plan:   Post-operative Plan:   Informed Consent: I have reviewed the patients History and Physical, chart, labs and discussed the procedure including the risks, benefits and alternatives for the proposed anesthesia with the patient or authorized representative who has indicated his/her understanding and acceptance.     Dental Advisory Given  Plan Discussed with: CRNA  Anesthesia Plan Comments:         Anesthesia Quick Evaluation

## 2020-11-21 NOTE — Anesthesia Procedure Notes (Signed)
Procedure Name: MAC Date/Time: 11/21/2020 12:05 PM Performed by: Lily Peer, Arizbeth Cawthorn, CRNA Pre-anesthesia Checklist: Patient identified, Emergency Drugs available, Suction available, Patient being monitored and Timeout performed Oxygen Delivery Method: Nasal cannula Induction Type: IV induction

## 2020-11-21 NOTE — Anesthesia Postprocedure Evaluation (Signed)
Anesthesia Post Note  Patient: [name]  Procedure(s) Performed: COLONOSCOPY WITH PROPOFOL (N/A )  Patient location during evaluation: Endoscopy Anesthesia Type: General Level of consciousness: awake and alert Pain management: pain level controlled Vital Signs Assessment: post-procedure vital signs reviewed and stable Respiratory status: spontaneous breathing, nonlabored ventilation, respiratory function stable and patient connected to nasal cannula oxygen Cardiovascular status: blood pressure returned to baseline and stable Postop Assessment: no apparent nausea or vomiting Anesthetic complications: no   No complications documented.   Last Vitals:  Vitals:   11/21/20 1314 11/21/20 1324  BP: (!) 153/59 (!) 154/59  Pulse: 60 61  Resp: 18 16  Temp:    SpO2:      Last Pain:  Vitals:   11/21/20 1304  TempSrc: Temporal  PainSc: Asleep                 Martha Clan

## 2020-11-21 NOTE — Op Note (Signed)
[facility] Gastroenterology Patient Name: [name] Procedure Date: 11/21/2020 12:01 PM MRN: [id number] Account #: [id number] Date of Birth: 07-01-1961 Admit Type: Outpatient Age: 60 Room: [facility] ENDO ROOM 1 Gender: Male Note Status: Finalized Procedure:             Colonoscopy Indications:           Hematochezia Providers:             [name] MD, MD Medicines:             Propofol per Anesthesia Complications:         No immediate complications. Procedure:             Pre-Anesthesia Assessment:                        - Prior to the procedure, a History and Physical was                         performed, and patient medications and allergies were                         reviewed. The patient's tolerance of previous                         anesthesia was also reviewed. The risks and benefits                         of the procedure and the sedation options and risks                         were discussed with the patient. All questions were                         answered, and informed consent was obtained. Prior                         Anticoagulants: The patient has taken no previous                         anticoagulant or antiplatelet agents. ASA Grade                         Assessment: II - A patient with mild systemic disease.                         After reviewing the risks and benefits, the patient                         was deemed in satisfactory condition to undergo the                         procedure.                        After obtaining informed consent, the colonoscope was                         passed under direct vision. Throughout the procedure,  the patient's blood pressure, pulse, and oxygen                         saturations were monitored continuously. The                         Colonoscope was introduced through the anus and                         advanced to the the cecum, identified by appendiceal                          orifice and ileocecal valve. The colonoscopy was                         performed without difficulty. The patient tolerated                         the procedure well. The quality of the bowel                         preparation was fair. Findings:      The perianal and digital rectal examinations were normal.      The terminal ileum appeared normal. This was biopsied with a cold       forceps for histology.      A 9 mm polyp was found in the ileocecal valve. The polyp was sessile.       The polyp was removed with a hot snare. Resection and retrieval were       complete.      Two sessile polyps were found in the ascending colon. The polyps were 2       to 3 mm in size. These polyps were removed with a cold biopsy forceps.       Resection and retrieval were complete.      Five sessile polyps were found in the transverse colon. The polyps were       6 to 9 mm in size. These polyps were removed with a cold snare.       Resection and retrieval were complete.      Five sessile polyps were found in the descending colon. The polyps were       4 to 7 mm in size. These polyps were removed with a cold snare.       Resection and retrieval were complete.      A 5 mm polyp was found in the sigmoid colon. The polyp was sessile. The       polyp was removed with a cold snare. Resection and retrieval were       complete.      A localized area of mildly erythematous mucosa was found in the rectum.       Biopsies were taken with a cold forceps for histology. Impression:            - Preparation of the colon was fair.                        - The examined portion of the ileum was normal.                         Biopsied.                        -  One 9 mm polyp at the ileocecal valve, removed with                         a hot snare. Resected and retrieved.                        - Two 2 to 3 mm polyps in the ascending colon, removed                         with a cold biopsy forceps.  Resected and retrieved.                        - Five 6 to 9 mm polyps in the transverse colon,                         removed with a cold snare. Resected and retrieved.                        - Five 4 to 7 mm polyps in the descending colon,                         removed with a cold snare. Resected and retrieved.                        - One 5 mm polyp in the sigmoid colon, removed with a                         cold snare. Resected and retrieved.                        - Erythematous mucosa in the rectum. Biopsied. Recommendation:        - Return patient to hospital ward for ongoing care.                        - Resume previous diet.                        - Continue present medications.                        - Await pathology results. Procedure Code(s):     --- Professional ---                        [phone], Colonoscopy, flexible; with removal of                         tumor(s), polyp(s), or other lesion(s) by snare                         technique                        30076, 30, Colonoscopy, flexible; with biopsy, single                         or multiple Diagnosis Code(s):     --- Professional ---  K92.1, Melena (includes Hematochezia)                        K63.5, Polyp of colon CPT copyright 2019 American Medical Association. All rights reserved. The codes documented in this report are preliminary and upon coder review may  be revised to meet current compliance requirements. [name] MD, MD 11/21/2020 1:03:09 PM This report has been signed electronically. Number of Addenda: 0 Note Initiated On: 11/21/2020 12:01 PM Scope Withdrawal Time: 0 hours 38 minutes 8 seconds  Total Procedure Duration: 0 hours 44 minutes 32 seconds  Estimated Blood Loss:  Estimated blood loss: none.      [facility]

## 2020-11-21 NOTE — Consult Note (Signed)
Neurology Consultation Reason for Consult: Delirium Referring Physician: Amery, S  CC: Confusion  History is obtained from: Wife, chart review  HPI: [name] is a 60 y.o. male with a history of Crohn's disease, diabetes, ICH, seizures who presents with confusion in the setting of bowel inflammation.  He has a history of delirium, when in rehab previously, he would think people were in his room that were not there.  With situational changes, he has also become delirious, reporting that while at home, he would occasionally think he was back in rehab.  He was diagnosed with B12 deficiency recently started on B12 injections.  He also was evaluated by the neuropsychologist at [facility] and diagnosed with major neurocognitive deficit, likely vascular dementia.  His wife reports that he becomes confused at times, sometimes acting like he is "60 years old" and having problems with his memory, but other times doing better.  She does note that he seems worse at night and better during the day.  For evaluation of the symptoms, given his stroke history an MRI was obtained which demonstrates significant vascular disease burden with small vessel changes as well as a very large previous right frontal stroke.  He has a chronic open wound on his back.  Due to melena, he was evaluated by gastroenterology with his hospitalization, CT abdomen pelvis demonstrates thickening of the bowel wall consistent with inflammation.  Of note, he is due for a B12 injection today.  ROS: A 14 point ROS was performed and is negative except as noted in the HPI.   Past Medical History:  Diagnosis Date  . Crohn disease ([place])   . Diabetes mellitus without complication ([place])   . DVT of lower extremity (deep venous thrombosis) ([place]) 2016  . Empyema ([place]) 05/20/2017  . Encephalopathy 12/04/2017  . Fall at home, initial encounter 09/12/2020  . Hidradenitis suppurativa   . Hypertension   . ICH (intracerebral hemorrhage) ([place])   .  Peritonitis ([place]) 04/21/2017  . Pyogenic arthritis of knee ([place]) 02/04/2016  . Renal disorder   . Sepsis ([place]) 01/12/2018  . Stroke [facility])      Family History  Problem Relation Age of Onset  . Irritable bowel syndrome Sister   . Diabetes Sister   . Heart disease Mother   . Diabetes Mother   . Heart disease Father   . Rheumatic fever Father        as child  . Psoriasis Brother   . Arthritis Brother   . Diabetes Sister   . Diabetes Sister      Social History:  reports that he has never smoked. He has never used smokeless tobacco. He reports current drug use. Drug: Marijuana. He reports that he does not drink alcohol.   Exam: Current vital signs: BP (!) 163/65   Pulse 63   Temp 98.6 F (37 C) (Oral)   Resp 16   Ht 5' 11"  (1.803 m)   Wt 60.6 kg   SpO2 100%   BMI 18.63 kg/m  Vital signs in last 24 hours: Temp:  [98 F (36.7 C)-99.3 F (37.4 C)] 98.6 F (37 C) (02/04 0333) Pulse Rate:  [58-70] 63 (02/04 0512) Resp:  [12-24] 16 (02/04 0333) BP: (158-208)/(65-91) 163/65 (02/04 0512) SpO2:  [96 %-100 %] 100 % (02/04 0333)   Physical Exam  Constitutional: Appears well-developed and well-nourished.  Psych: Affect appropriate to situation Eyes: No scleral injection HENT: No OP obstruction MSK: no joint deformities.  Cardiovascular: Normal rate and regular  rhythm.  Respiratory: Effort normal, non-labored breathing GI: Soft.  No distension. There is no tenderness.   Neuro: Mental Status: Patient is awake, alert, oriented to person, place, month, year, and situation. No signs of aphasia or neglect He does have problems with attention, but is able to provide history well. Cranial Nerves: II: Visual Fields are full. Pupils are equal, round, and reactive to light.   III,IV, VI: EOMI without ptosis or diploplia.  V: Facial sensation is symmetric to temperature VII: Facial movement is symmetric.  VIII: hearing is intact to voice X: Uvula elevates symmetrically XI:  Shoulder shrug is symmetric. XII: tongue is midline without atrophy or fasciculations.  Motor: Tone is normal. Bulk is normal. 5/5 strength was present in all four extremities.  Sensory: Sensation is symmetric to light touch and temperature in the arms and legs. Cerebellar: No clear ataxia on finger-nose-finger     I have reviewed labs in epic and the results pertinent to this consultation are: B12 from 12/2-205  I have reviewed the images obtained: MRI brain-he has significant chronic ischemic disease as well as atrophy greater than I would expect given his age, also fairly large old right MCA territory infarct.  Impression: 60 year old male with known major neurocognitive disorder (likely vascular dementia) who has developed multifactorial delirium in the setting of bowel wall inflammation (which could allow some contribution of endotoxin from gram-negatives), situational change with hospitalization, and possible medication effects.  Though there is some risk of worsening delirium with anesthetic agents, given the need for his procedure I would not object from a neurological perspective.  Given he is due for a B12 injection, I would go ahead and give that while he is here also would check for other possible contributing factors including thyroid disease and hyperammonemia.  Is worsening at nighttime is not surprising to me as this is common with hospital-based delirium.  If it continues to be an issue, could use low-dose Seroquel given around 8 PM.  Recommendations: 1) B12, TSH, ammonia 2) I have ordered a B12 injection 3) he will likely need outpatient neurological follow-up, his wife lives in [place] and would favor following up with [facility] clinic, please provide the number to this clinic at discharge. 4) wife repeatedly expresses concerns about her ability to take care of him at home,?  Need for placement. 5) consider Seroquel 25 mg on an as-needed basis around 8 PM if nighttime  agitation/confusion continues to be an issue 6)  neurology will continue to be available on an as-needed basis.  Roland Rack, MD [facility] [phone]  If 7pm- 7am, please page neurology on call as listed in [place].

## 2020-11-21 NOTE — Progress Notes (Signed)
[facility], [place] 11/21/20  Subjective:   LOS: 3 Patient laying in bed on our arrival.  He is alert and oriented.  He has no acute complaints States he is able to eat meals.  Currently NPO for Colonoscopy today States he has been up OOB   Last HD was November 19, 2020  Objective:  Vital signs in last 24 hours:  Temp:  [98 F (36.7 C)-99.3 F (37.4 C)] 98.1 F (36.7 C) (02/04 1304) Pulse Rate:  [58-70] 61 (02/04 1324) Resp:  [14-20] 16 (02/04 1324) BP: (153-208)/(59-91) 154/59 (02/04 1324) SpO2:  [96 %-100 %] 100 % (02/04 1130) Weight:  [60.6 kg] 60.6 kg (02/04 1130)  Weight change:  Filed Weights   11/18/20 1059 11/18/20 1628 11/21/20 1130  Weight: 66.7 kg 60.6 kg 60.6 kg    Intake/Output:    Intake/Output Summary (Last 24 hours) at 11/21/2020 1355 Last data filed at 11/21/2020 1256 Gross per 24 hour  Intake 432.42 ml  Output --  Net 432.42 ml     Physical Exam: General: Laying in bed, resting quietly  HEENT Moist oral mucosa  Pulm/lungs clear  CVS/Heart regular  Abdomen:  Soft, non-tender  Extremities: Minimal edema  Neurologic: Alert, answers questions appropriately  Skin: No masses  Access: Left arm AV fistula       Basic Metabolic Panel:  Recent Labs  Lab 11/18/20 1101 11/19/20 0321 11/20/20 0416  NA 138 138 136  K 3.6 3.6 3.8  CL 95* 97* 97*  CO2 27 26 25   GLUCOSE 161* 105* 71  BUN 30* 40* 25*  CREATININE 5.67* 6.99* 4.55*  CALCIUM 9.3 8.8* 8.6*     CBC: Recent Labs  Lab 11/19/20 0321 11/19/20 0911 11/19/20 1822 11/20/20 0416 11/20/20 0932  WBC 6.6 7.2 7.2 7.7 7.4  HGB 7.5* 8.0* 7.0* 8.6* 8.9*  HCT 24.1* 25.8* 22.9* 27.2* 28.8*  MCV 84.6 84.6 85.4 84.5 84.7  PLT 104* 106* 98* 108* 120*      Lab Results  Component Value Date   HEPBSAG NON REACTIVE 09/13/2020      Microbiology:  Recent Results (from the past 240 hour(s))  Blood culture (routine x 2)     Status: None (Preliminary result)    Collection Time: 11/18/20  1:01 PM   Specimen: BLOOD  Result Value Ref Range Status   Specimen Description BLOOD RIGHT ANTECUBITAL  Final   Special Requests   Final    BOTTLES DRAWN AEROBIC AND ANAEROBIC Blood Culture results may not be optimal due to an excessive volume of blood received in culture bottles   Culture   Final    NO GROWTH 3 DAYS Performed at [facility], [address] [place], [place] [address]    Report Status PENDING  Incomplete  SARS Coronavirus 2 by RT PCR (hospital order, performed in [place] hospital lab) Nasopharyngeal Nasopharyngeal Swab     Status: None   Collection Time: 11/18/20  1:01 PM   Specimen: Nasopharyngeal Swab  Result Value Ref Range Status   SARS Coronavirus 2 NEGATIVE NEGATIVE Final    Comment: (NOTE) SARS-CoV-2 target nucleic acids are NOT DETECTED.  The SARS-CoV-2 RNA is generally detectable in upper and lower respiratory specimens during the acute phase of infection. The lowest concentration of SARS-CoV-2 viral copies this assay can detect is 250 copies / mL. A negative result does not preclude SARS-CoV-2 infection and should not be used as the sole basis for treatment or other patient management decisions.  A negative result may occur with improper specimen collection / handling, submission of specimen other than nasopharyngeal swab, presence of viral mutation(s) within the areas targeted by this assay, and inadequate number of viral copies (<250 copies / mL). A negative result must be combined with clinical observations, patient history, and epidemiological information.  Fact Sheet for Patients:   StrictlyIdeas.no  Fact Sheet for Healthcare Providers: BankingDealers.co.za  This test is not yet approved or  cleared by the [place] FDA and has been authorized for detection and/or diagnosis of SARS-CoV-2 by FDA under an Emergency Use Authorization (EUA).  This EUA will  remain in effect (meaning this test can be used) for the duration of the COVID-19 declaration under Section 564(b)(1) of the Act, 21 U.S.C. section 360bbb-3(b)(1), unless the authorization is terminated or revoked sooner.  Performed at [facility], [address] [place], [place] [address]   Blood culture (routine x 2)     Status: None (Preliminary result)   Collection Time: 11/18/20  4:42 PM   Specimen: BLOOD  Result Value Ref Range Status   Specimen Description BLOOD LEFT ANTECUBITAL  Final   Special Requests   Final    BOTTLES DRAWN AEROBIC AND ANAEROBIC Blood Culture adequate volume   Culture   Final    NO GROWTH 3 DAYS Performed at [facility], [address] [place], [place] [address]    Report Status PENDING  Incomplete    Coagulation Studies: No results for input(s): LABPROT, INR in the last 72 hours.  Urinalysis: No results for input(s): COLORURINE, LABSPEC, PHURINE, GLUCOSEU, HGBUR, BILIRUBINUR, KETONESUR, PROTEINUR, UROBILINOGEN, NITRITE, LEUKOCYTESUR in the last 72 hours.  Invalid input(s): APPERANCEUR    Imaging: MR BRAIN WO CONTRAST  Result Date: 11/21/2020 CLINICAL DATA:  Lethargy and confusion. EXAM: MRI HEAD WITHOUT CONTRAST TECHNIQUE: Multiplanar, multiecho pulse sequences of the brain and surrounding structures were obtained without intravenous contrast. COMPARISON:  09/12/2020 FINDINGS: Brain: No acute infarct, mass effect or extra-axial collection. Chronic blood products in the anterior right MCA territory. Hyperintense T2-weighted signal is moderately widespread throughout the white matter. Generalized volume loss without a clear lobar predilection. Old anterior right MCA territory infarct. Anterior left temporal lobe encephalomalacia. The midline structures are normal. Vascular: Major flow voids are preserved. Skull and upper cervical spine: Normal calvarium and skull base. Visualized upper cervical spine and soft tissues are normal.  Sinuses/Orbits:No paranasal sinus fluid levels or advanced mucosal thickening. No mastoid or middle ear effusion. Normal orbits. IMPRESSION: 1. No acute intracranial abnormality. 2. Old right MCA territory infarct and findings of chronic small vessel disease. Electronically Signed   By: Ulyses Jarred M.D.   On: 11/21/2020 02:52     Medications:   . sodium chloride 500 mL (11/21/20 0311)  . pantoprozole (PROTONIX) infusion 8 mg/hr (11/21/20 0310)  . pantoprazole (PROTONIX) 80 mg IVPB    . piperacillin-tazobactam (ZOSYN)  IV 2.25 g (11/21/20 0918)   . amLODipine  10 mg Oral Daily  . atorvastatin  80 mg Oral Daily  . carvedilol  25 mg Oral BID  . Chlorhexidine Gluconate Cloth  6 each Topical Q0600  . cyanocobalamin  1,000 mcg Intramuscular Once  . epoetin (EPOGEN/PROCRIT) injection  4,000 Units Intravenous Q M,W,F-HD  . ferric citrate  420 mg Oral BID  . furosemide  80 mg Oral BID  . gabapentin  300 mg Oral QHS  . insulin aspart  0-5 Units Subcutaneous QHS  . insulin aspart  0-9 Units Subcutaneous TID WC  .  levETIRAcetam  500 mg Oral Daily  . levETIRAcetam  500 mg Oral Q M,W,F-1800  . lisinopril  20 mg Oral Daily  . [START ON 11/22/2020] pantoprazole  40 mg Intravenous Q12H  . sevelamer carbonate  800 mg Oral TID WC   sodium chloride, acetaminophen, hydrALAZINE, LORazepam, ondansetron  Assessment/ Plan:  60 y.o. male with ESRD on HD MWF followed by [facility] nephrology, diabetes mellitus type 2, history of DVT,bilateralbelow the knee amputations, hypertension, history of intracerebral hemorrhage, anemia of chronic kidney disease, secondary hyperparathyroidism, seizure disorder  was admitted on 11/18/2020 for  Principal Problem:   Melena Active Problems:   Hypercholesteremia   Hypertension   Crohn's disease ([place])   Type 2 diabetes mellitus with kidney complication, with long-term current use of insulin (HCC)   End stage renal failure on dialysis ([place])   History of CVA (cerebrovascular  accident)   Seizure disorder ([place])   Acute metabolic encephalopathy   Anemia in ESRD (end-stage renal disease) ([place])   Chronic systolic CHF (congestive heart failure) (HCC)   Polyp of transverse colon  Melena [K92.1] Bloody diarrhea [R19.7] Fever, unspecified fever cause [R50.9] Altered mental status, unspecified altered mental status type [R41.82] Rectal bleeding [K62.5]   [facility] nephrology/[address]/ MWF  #. ESRD -plan for hemodialysis today after procedure -Next hemodialysis will be Monday   #. Anemia of CKD  Lab Results  Component Value Date   HGB 8.9 (L) 11/20/2020   Low dose EPO 4,000 units with HD  #. Secondary hyperparathyroidism of renal origin N 25.81  -Selevamer with meals    Component Value Date/Time   PTH 283 (H) 01/08/2019 0516   Lab Results  Component Value Date   PHOS 3.8 09/14/2020   Monitor calcium and phos levels   #. Diabetes type 2 with CKD Hemoglobin A1C (%)  Date Value  07/24/2014 8.3 (H)   Hgb A1c MFr Bld (%)  Date Value  09/13/2020 6.7 (H)  -Currently on Novolog SSI for meals and night time coverage  #Suspected Crohn's Disease -patient previously refused invasive studies.  -Currently getting colonoscopy to evaluate marked circumferential thickening of distal rectum and anus. -GI evaluation ongoing   LOS: 3 [place] 2/4/20221:55 PM   Patient was seen and evaluated with Colon Flattery, NP. She assisted with transcription of the note.   [address] [place], [place]

## 2020-11-21 NOTE — Progress Notes (Signed)
PROGRESS NOTE    [name]  QZR:[id number] DOB: [birth year] DOA: 11/18/2020 PCP: [name], MD    Brief Narrative:  [name] is a 60 y.o. male with medical history significant Crohn's Dz, ESRD-HD (MWF), HTN, HLD, DM, stroke, seizure, sCHF, anemia, ICH, PVD, DVT not anticoagulated, bilateral BKA, presents with AMS and rectal pain with bleeding and melena.  2/2-has not had any further melena.  Hematuria persists today. 2/3-EGD today. No cscope as nsg reported he did not finish is prep 2/4-s/p Cscope today. HD today too. Was confused yesterday post egd    Consultants:   Nephrology, GI  Procedures: Hemodialysis CT of abdomen/pelvis: 1. Marked circumferential thickening of the distal rectum and anus with suspected open fistula/dehiscence posteriorly. Adjacent skin and soft tissue thickening in the surrounding soft tissues likely reflecting chronic decubitus ulcer. Correlate with physical exam. 2. Prominent rectal stool ball. 3. Thickened appearance of the urinary bladder wall, which may be accentuated by underdistention. Correlate with urinalysis to exclude cystitis. 4. Cholelithiasis without evidence of acute cholecystitis. 5. Marked extensive atherosclerosis. 6. Right sided pleural thickening and pleural calcification. 7. No acute osseous findings or evidence of osteomyelitis. Findings suggestive of renal osteodystrophy   Antimicrobials:   Zosyn   Subjective: Has no complaints this am. No abd pain/n/v  Objective: Vitals:   11/21/20 1130 11/21/20 1304 11/21/20 1314 11/21/20 1324  BP: (!) 178/71  (!) 153/59 (!) 154/59  Pulse: 64  60 61  Resp: 16 20 18 16   Temp: 98.6 F (37 C) 98.1 F (36.7 C)    TempSrc: Temporal Temporal    SpO2: 100%     Weight: 60.6 kg     Height: 5' 11"  (1.803 m)       Intake/Output Summary (Last 24 hours) at 11/21/2020 1511 Last data filed at 11/21/2020 1256 Gross per 24 hour  Intake 300 ml  Output --  Net 300 ml   Filed  Weights   11/18/20 1059 11/18/20 1628 11/21/20 1130  Weight: 66.7 kg 60.6 kg 60.6 kg    Examination: Calm, nad cta no w/r/r Regular s1/s2 no gallop Soft benign + bs No edema Aaxox3 Mood and affect appropriate in current setting    Data Reviewed: I have personally reviewed following labs and imaging studies  CBC: Recent Labs  Lab 11/19/20 0321 11/19/20 0911 11/19/20 1822 11/20/20 0416 11/20/20 0932  WBC 6.6 7.2 7.2 7.7 7.4  HGB 7.5* 8.0* 7.0* 8.6* 8.9*  HCT 24.1* 25.8* 22.9* 27.2* 28.8*  MCV 84.6 84.6 85.4 84.5 84.7  PLT 104* 106* 98* 108* 354*   Basic Metabolic Panel: Recent Labs  Lab 11/18/20 1101 11/19/20 0321 11/20/20 0416  NA 138 138 136  K 3.6 3.6 3.8  CL 95* 97* 97*  CO2 27 26 25   GLUCOSE 161* 105* 71  BUN 30* 40* 25*  CREATININE 5.67* 6.99* 4.55*  CALCIUM 9.3 8.8* 8.6*   GFR: Estimated Creatinine Clearance: 15.2 mL/min (A) (by C-G formula based on SCr of 4.55 mg/dL (H)). Liver Function Tests: No results for input(s): AST, ALT, ALKPHOS, BILITOT, PROT, ALBUMIN in the last 168 hours. No results for input(s): LIPASE, AMYLASE in the last 168 hours. No results for input(s): AMMONIA in the last 168 hours. Coagulation Profile: Recent Labs  Lab 11/18/20 1101  INR 1.1   Cardiac Enzymes: No results for input(s): CKTOTAL, CKMB, CKMBINDEX, TROPONINI in the last 168 hours. BNP (last 3 results) No results for input(s): PROBNP in the last 8760 hours. HbA1C: No  results for input(s): HGBA1C in the last 72 hours. CBG: Recent Labs  Lab 11/20/20 1427 11/20/20 1648 11/20/20 2100 11/21/20 0728 11/21/20 1134  GLUCAP 139* 121* 116* 123* 109*   Lipid Profile: No results for input(s): CHOL, HDL, LDLCALC, TRIG, CHOLHDL, LDLDIRECT in the last 72 hours. Thyroid Function Tests: No results for input(s): TSH, T4TOTAL, FREET4, T3FREE, THYROIDAB in the last 72 hours. Anemia Panel: No results for input(s): VITAMINB12, FOLATE, FERRITIN, TIBC, IRON, RETICCTPCT in the  last 72 hours. Sepsis Labs: Recent Labs  Lab 11/18/20 1125  LATICACIDVEN 1.0    Recent Results (from the past 240 hour(s))  Blood culture (routine x 2)     Status: None (Preliminary result)   Collection Time: 11/18/20  1:01 PM   Specimen: BLOOD  Result Value Ref Range Status   Specimen Description BLOOD RIGHT ANTECUBITAL  Final   Special Requests   Final    BOTTLES DRAWN AEROBIC AND ANAEROBIC Blood Culture results may not be optimal due to an excessive volume of blood received in culture bottles   Culture   Final    NO GROWTH 3 DAYS Performed at [facility], [address], [place], [place] [address]    Report Status PENDING  Incomplete  SARS Coronavirus 2 by RT PCR (hospital order, performed in [place] hospital lab) Nasopharyngeal Nasopharyngeal Swab     Status: None   Collection Time: 11/18/20  1:01 PM   Specimen: Nasopharyngeal Swab  Result Value Ref Range Status   SARS Coronavirus 2 NEGATIVE NEGATIVE Final    Comment: (NOTE) SARS-CoV-2 target nucleic acids are NOT DETECTED.  The SARS-CoV-2 RNA is generally detectable in upper and lower respiratory specimens during the acute phase of infection. The lowest concentration of SARS-CoV-2 viral copies this assay can detect is 250 copies / mL. A negative result does not preclude SARS-CoV-2 infection and should not be used as the sole basis for treatment or other patient management decisions.  A negative result may occur with improper specimen collection / handling, submission of specimen other than nasopharyngeal swab, presence of viral mutation(s) within the areas targeted by this assay, and inadequate number of viral copies (<250 copies / mL). A negative result must be combined with clinical observations, patient history, and epidemiological information.  Fact Sheet for Patients:   StrictlyIdeas.no  Fact Sheet for Healthcare Providers: BankingDealers.co.za  This  test is not yet approved or  cleared by the [place] FDA and has been authorized for detection and/or diagnosis of SARS-CoV-2 by FDA under an Emergency Use Authorization (EUA).  This EUA will remain in effect (meaning this test can be used) for the duration of the COVID-19 declaration under Section 564(b)(1) of the Act, 21 U.S.C. section 360bbb-3(b)(1), unless the authorization is terminated or revoked sooner.  Performed at [facility], [address] [place], [place] [address]   Blood culture (routine x 2)     Status: None (Preliminary result)   Collection Time: 11/18/20  4:42 PM   Specimen: BLOOD  Result Value Ref Range Status   Specimen Description BLOOD LEFT ANTECUBITAL  Final   Special Requests   Final    BOTTLES DRAWN AEROBIC AND ANAEROBIC Blood Culture adequate volume   Culture   Final    NO GROWTH 3 DAYS Performed at [facility], [address] [place], [place] [address]    Report Status PENDING  Incomplete         Radiology Studies: MR BRAIN WO CONTRAST  Result Date:  11/21/2020 CLINICAL DATA:  Lethargy and confusion. EXAM: MRI HEAD WITHOUT CONTRAST TECHNIQUE: Multiplanar, multiecho pulse sequences of the brain and surrounding structures were obtained without intravenous contrast. COMPARISON:  09/12/2020 FINDINGS: Brain: No acute infarct, mass effect or extra-axial collection. Chronic blood products in the anterior right MCA territory. Hyperintense T2-weighted signal is moderately widespread throughout the white matter. Generalized volume loss without a clear lobar predilection. Old anterior right MCA territory infarct. Anterior left temporal lobe encephalomalacia. The midline structures are normal. Vascular: Major flow voids are preserved. Skull and upper cervical spine: Normal calvarium and skull base. Visualized upper cervical spine and soft tissues are normal. Sinuses/Orbits:No paranasal sinus fluid levels or advanced mucosal thickening. No  mastoid or middle ear effusion. Normal orbits. IMPRESSION: 1. No acute intracranial abnormality. 2. Old right MCA territory infarct and findings of chronic small vessel disease. Electronically Signed   By: Ulyses Jarred M.D.   On: 11/21/2020 02:52        Scheduled Meds: . amLODipine  10 mg Oral Daily  . atorvastatin  80 mg Oral Daily  . carvedilol  25 mg Oral BID  . Chlorhexidine Gluconate Cloth  6 each Topical Q0600  . cyanocobalamin  1,000 mcg Intramuscular Once  . epoetin (EPOGEN/PROCRIT) injection  4,000 Units Intravenous Q M,W,F-HD  . ferric citrate  420 mg Oral BID  . furosemide  80 mg Oral BID  . gabapentin  300 mg Oral QHS  . insulin aspart  0-5 Units Subcutaneous QHS  . insulin aspart  0-9 Units Subcutaneous TID WC  . levETIRAcetam  500 mg Oral Daily  . levETIRAcetam  500 mg Oral Q M,W,F-1800  . lisinopril  20 mg Oral Daily  . [START ON 11/22/2020] pantoprazole  40 mg Intravenous Q12H  . sevelamer carbonate  800 mg Oral TID WC   Continuous Infusions: . sodium chloride 500 mL (11/21/20 0311)  . pantoprazole (PROTONIX) 80 mg IVPB    . piperacillin-tazobactam (ZOSYN)  IV 2.25 g (11/21/20 0918)    Assessment & Plan:   Principal Problem:   Melena Active Problems:   Hypercholesteremia   Hypertension   Crohn's disease ([place])   Type 2 diabetes mellitus with kidney complication, with long-term current use of insulin (HCC)   End stage renal failure on dialysis ([place])   History of CVA (cerebrovascular accident)   Seizure disorder ([place])   Acute metabolic encephalopathy   Anemia in ESRD (end-stage renal disease) ([place])   Chronic systolic CHF (congestive heart failure) (HCC)   Polyp of transverse colon   Melena: Patient has some melena, denies nausea, vomiting, diarrhea.  He has rectal pain, which may be due to history of hidradenitis per his wife.  Patient has fever of 101 in ED.  Does not meet criteria for sepsis.  Since patient has history of Crohn's disease, not sure if  patient has Crohn's disease flareup.   2/4-[place] EGD found with duodenitis and small hiatal hernia done on 2/3  Status post colonoscopy this a.m. -found with multiple polyps, erythematous mucosa in the rectum which was biopsied.  Poor examined portion of the ileum which was normal.   Will need to follow-up with GI as outpatient  Per Dr. Verl Blalock via chat will discontinue Zosyn    Crohn's disease:  Patient on weekly [name] injection  2/4-status post C-scope please see above   Hypercholesteremia Continue statin   End stage renal failure on dialysis (MWF):  Nephrology following  Hemodialysis today   Hypertension Overall stable Continue amlodipine, Coreg, lisinopril  Continue IV hydralazine as needed     Type 2 diabetes mellitus with kidney complication, with long-term current use of insulin ([place]): Recent A1c 6.7, well controlled.  Patient is taking NovoLog and Lantus -BG stable  Continue Lantus and R-ISS       History of CVA (cerebrovascular accident) Continue Lipitor  Seizure -Seizure precaution -When necessary Ativan for seizure -Continue Home medications: Keppra  Acute metabolic encephalopathy: Etiology is not clear.  CT head negative.  No focal neuro deficit on physical examination.  Possibly due to ongoing infection 2/3-spoke to wife who is concerned about patient being confused in the evening and reports he is not at his baseline.  Although both times I have seen him in the morning patient is appropriate and not confused.  Wife would like neurology to be consulted. After much discussion we will obtain MRI of the brain as patient's wife states patient could not get up on the day of admission and she was scared that he had a stroke. Neurology consulted 3/4-mri negative for acute abn Neuro patient has chronic baseline delirium at times. Check B12, TSH and ammonia Nighttime worsening is common with hospital-based delirium.  Can add Seroquel if needed at 8  PM. Will need outpatient neurologic follow-up, wife favors [facility]. Wife has concerns about patient going home.  Will consult PT for evaluation of home versus SNF   Anemia in ESRD (end-stage renal disease) ([place]) -Follow-up by CBC -Continue iron supplement  Chronic systolic CHF (congestive heart failure) ([place]): 2D echo on 09/13/2020 showed EF of 40-50% with grade 2 diastolic dysfunction.  Patient does not have respiratory distress.  No pulmonary edema on chest x-ray.  CHF is compensated. -Volume management by dialysis per renal        DVT prophylaxis: SCD Code Status: Full Family Communication: None at bedside  Status is: Inpatient  Remains inpatient appropriate because:Inpatient level of care appropriate due to severity of illness   Dispo: The patient is from: Home              Anticipated d/c is to: TBD              Anticipated d/c date is: 2 days              Patient currently is not medically stable to d/c. w/u pending. PT evaluation pending.   Difficult to place patient No          LOS: 3 days   Time spent: 35 minutes with more than 50% of COC    Nolberto Hanlon, MD [facility] Pager [phone]  If 7PM-7AM, please contact night-coverage 11/21/2020, 3:11 PM

## 2020-11-21 NOTE — Plan of Care (Signed)
Continuing with plan of care. 

## 2020-11-21 NOTE — Care Management Important Message (Signed)
Important Message  Patient Details  Name: [name] MRN: [id number] Date of Birth: [birth year]   Medicare Important Message Given:  Yes     [name] 11/21/2020, 11:41 AM

## 2020-11-21 NOTE — Progress Notes (Signed)
PT Cancellation Note  Patient Details Name: [name] MRN: [id number] DOB: Dec 26, 1960   Cancelled Treatment:    Reason Eval/Treat Not Completed: Patient at procedure or test/unavailable (Consult received and chart reviewed. Patient currently off unit for procedure; will re-attempt at later time/date as medically appropriate and available.)   Genoveva Singleton H. Owens Shark, PT, DPT, NCS 11/21/20, 12:27 PM [phone]

## 2020-11-21 NOTE — Transfer of Care (Signed)
Immediate Anesthesia Transfer of Care Note  Patient: [name]  Procedure(s) Performed: COLONOSCOPY WITH PROPOFOL (N/A )  Patient Location: PACU and Endoscopy Unit  Anesthesia Type:General  Level of Consciousness: drowsy  Airway & Oxygen Therapy: Patient Spontanous Breathing  Post-op Assessment: Report given to RN and Post -op Vital signs reviewed and stable  Post vital signs: Reviewed and stable  Last Vitals:  Vitals Value Taken Time  BP 121/52 11/21/20 1305  Temp    Pulse 60 11/21/20 1306  Resp 19 11/21/20 1306  SpO2 100 % 11/21/20 1306  Vitals shown include unvalidated device data.  Last Pain:  Vitals:   11/21/20 1130  TempSrc: Temporal  PainSc: 0-No pain         Complications: No complications documented.

## 2020-11-21 NOTE — Procedures (Signed)
Patient in endo-try to do eeg later

## 2020-11-21 NOTE — Procedures (Signed)
Patient refused

## 2020-11-21 NOTE — Progress Notes (Signed)
Patient irritable in beginning of shift, drank about 2 cups of colon prep and refused to drink anymore (new jug of nulytely given to him shortly before shift change. Stated that he thinks it's a medication that he was given in the past making him have hallucinations, but does not recall what name of medication is. He believes he might be getting that medication again causing him to act differently.He thought the room was his son's new workplace. Has been A&Ox2-3, not to place or time, somewhat to situation. BP has been 254-982 systolic. Gave Hydralazine 5mg  IV at 2054 and 339, BP has came down to 641-583 systolic. He has been more cooperative after midnight. Went to bathroom twice with prosthetics and front wheel walker, has 2 bowel movement, first at midnight with brown, yellow loose stool and hard small formed lumps and second at 0330 medium size yellow brown stools. Went down for MRI around 0230, cooperative.

## 2020-11-22 DIAGNOSIS — K921 Melena: Secondary | ICD-10-CM | POA: Diagnosis not present

## 2020-11-22 DIAGNOSIS — K50919 Crohn's disease, unspecified, with unspecified complications: Secondary | ICD-10-CM | POA: Diagnosis not present

## 2020-11-22 DIAGNOSIS — R4182 Altered mental status, unspecified: Secondary | ICD-10-CM | POA: Diagnosis not present

## 2020-11-22 LAB — CBC
HCT: 25 % — ABNORMAL LOW (ref 39.0–52.0)
Hemoglobin: 7.5 g/dL — ABNORMAL LOW (ref 13.0–17.0)
MCH: 25.6 pg — ABNORMAL LOW (ref 26.0–34.0)
MCHC: 30 g/dL (ref 30.0–36.0)
MCV: 85.3 fL (ref 80.0–100.0)
Platelets: 118 10*3/uL — ABNORMAL LOW (ref 150–400)
RBC: 2.93 MIL/uL — ABNORMAL LOW (ref 4.22–5.81)
RDW: 19.2 % — ABNORMAL HIGH (ref 11.5–15.5)
WBC: 5.9 10*3/uL (ref 4.0–10.5)
nRBC: 0 % (ref 0.0–0.2)

## 2020-11-22 LAB — HEMOGLOBIN AND HEMATOCRIT, BLOOD
HCT: 21.4 % — ABNORMAL LOW (ref 39.0–52.0)
Hemoglobin: 6.6 g/dL — ABNORMAL LOW (ref 13.0–17.0)

## 2020-11-22 LAB — GLUCOSE, CAPILLARY
Glucose-Capillary: 130 mg/dL — ABNORMAL HIGH (ref 70–99)
Glucose-Capillary: 154 mg/dL — ABNORMAL HIGH (ref 70–99)
Glucose-Capillary: 143 mg/dL — ABNORMAL HIGH (ref 70–99)
Glucose-Capillary: 192 mg/dL — ABNORMAL HIGH (ref 70–99)

## 2020-11-22 MED ORDER — PIPERACILLIN-TAZOBACTAM IN DEX 2-0.25 GM/50ML IV SOLN
2.2500 g | Freq: Three times a day (TID) | INTRAVENOUS | Status: DC
  Administered 2020-11-22: 2.25 g via INTRAVENOUS
  Filled 2020-11-22 (×3): qty 50

## 2020-11-22 MED ORDER — PANTOPRAZOLE SODIUM 40 MG PO TBEC: 40.0000 mg | DELAYED_RELEASE_TABLET | Freq: Every day | ORAL | 0 refills | Status: DC

## 2020-11-22 MED ORDER — SODIUM CHLORIDE 0.9% IV SOLUTION: Freq: Once | INTRAVENOUS | Status: AC

## 2020-11-22 MED ORDER — HYDRALAZINE HCL 20 MG/ML IJ SOLN
10.0000 mg | INTRAMUSCULAR | Status: DC | PRN
  Administered 2020-11-22 – 2020-11-23 (×3): 10 mg via INTRAVENOUS
  Filled 2020-11-22 (×3): qty 1

## 2020-11-22 MED ORDER — HYDRALAZINE HCL 25 MG PO TABS
25.0000 mg | ORAL_TABLET | Freq: Three times a day (TID) | ORAL | Status: DC
  Administered 2020-11-22 – 2020-11-25 (×8): 25 mg via ORAL
  Filled 2020-11-22 (×8): qty 1

## 2020-11-22 MED ORDER — HYDRALAZINE HCL 25 MG PO TABS: 25.0000 mg | ORAL_TABLET | Freq: Three times a day (TID) | ORAL | 0 refills | Status: DC

## 2020-11-22 NOTE — Evaluation (Signed)
Physical Therapy Evaluation Patient Details Name: [name] MRN: [id number] DOB: 1961-10-17 Today's Date: 11/22/2020   History of Present Illness  [name] is a 60 y.o. male with medical history significant of  DM II, DVT's, H/O cva in 2018, Bil LE amputee and ESRD on HD. Per wife pt was on way for HD and seemed confused , went to shower and thinks he fell. Pt also had some mumbling. Noted to have tongue biting and urinary incontinence.Pt has h/o seizures. IOE:VOJJKKX right MCA infarct, could not get full MRI due to pt trying to get out of MRI machine  Clinical Impression  Pt independently donns and doffs prosthesis for gait training. Pt is able to safely and with supervision maneuver to and from bathroom. He demonstrates good eccentric control to lower himself to toilet height with the use of grab bars. Currently the pt presents with mild balance impairments, but overall per the pt is presenting at his PLOF. At this time the pt is demonstrating safety to return home with family care once medically stable.     Follow Up Recommendations Home health PT;Supervision - Intermittent    Equipment Recommendations    Pt reporting having all needs at home.    Recommendations for Other Services       Precautions / Restrictions Precautions Precautions: Fall Restrictions Weight Bearing Restrictions: No Other Position/Activity Restrictions: Bilateral BKA, prothesis (2) in room      Mobility  Bed Mobility Overal bed mobility: Independent             General bed mobility comments: Upon entry pt was seated at EOB.    Transfers Overall transfer level: Modified independent Equipment used: Rolling walker (2 wheeled);Ambulation equipment used                Ambulation/Gait Ambulation/Gait assistance: Modified independent (Device/Increase time) Gait Distance (Feet): 60 Feet Assistive device: Rolling walker (2 wheeled) Gait Pattern/deviations: Step-through pattern;Wide base  of support        Stairs            Wheelchair Mobility    Modified Rankin (Stroke Patients Only)       Balance Overall balance assessment: Mild deficits observed, not formally tested Sitting-balance support: No upper extremity supported Sitting balance-Leahy Scale: Good     Standing balance support: No upper extremity supported Standing balance-Leahy Scale: Fair Standing balance comment: Without BUE support the pt is unable to shift weight and presents with anterior-posterior sway.                             Pertinent Vitals/Pain Pain Assessment: No/denies pain    Home Living Family/patient expects to be discharged to:: Private residence Living Arrangements: Spouse/significant other Available Help at Discharge: Family;Available PRN/intermittently Type of Home: House Home Access: Ramped entrance     Home Layout: One level Home Equipment: Tub bench;Walker - 2 wheels;Crutches;Bedside commode      Prior Function Level of Independence: Independent with assistive device(s)         Comments: Does ADLs independently, does not do any IADLs. Reports using RW for ambulation.     Hand Dominance   Dominant Hand: Right    Extremity/Trunk Assessment   Upper Extremity Assessment Upper Extremity Assessment: Overall WFL for tasks assessed    Lower Extremity Assessment Lower Extremity Assessment: Overall WFL for tasks assessed    Cervical / Trunk Assessment Cervical / Trunk Assessment: Normal  Communication  Communication: No difficulties  Cognition Arousal/Alertness: Awake/alert Behavior During Therapy: WFL for tasks assessed/performed Overall Cognitive Status: Within Functional Limits for tasks assessed                                        General Comments      Exercises     Assessment/Plan    PT Assessment Patient needs continued PT services  PT Problem List Decreased balance;Decreased mobility       PT  Treatment Interventions Gait training;Therapeutic activities;Neuromuscular re-education;Functional mobility training    PT Goals (Current goals can be found in the Care Plan section)  Acute Rehab PT Goals Patient Stated Goal: to return home PT Goal Formulation: With patient Time For Goal Achievement: 12/06/20 Potential to Achieve Goals: Good    Frequency Min 2X/week   Barriers to discharge        Co-evaluation               AM-PAC PT "6 Clicks" Mobility  Outcome Measure Help needed turning from your back to your side while in a flat bed without using bedrails?: None Help needed moving from lying on your back to sitting on the side of a flat bed without using bedrails?: None Help needed moving to and from a bed to a chair (including a wheelchair)?: A Little Help needed standing up from a chair using your arms (e.g., wheelchair or bedside chair)?: A Little Help needed to walk in hospital room?: A Little Help needed climbing 3-5 steps with a railing? : A Lot 6 Click Score: 19    End of Session Equipment Utilized During Treatment: Gait belt Activity Tolerance: Patient tolerated treatment well Patient left: in bed;with call bell/phone within reach Nurse Communication: Mobility status PT Visit Diagnosis: Unsteadiness on feet (R26.81)    Time: 1610-9604 PT Time Calculation (min) (ACUTE ONLY): 27 min   Charges:   PT Evaluation $PT Eval Low Complexity: 1 Low PT Treatments $Gait Training: 8-22 mins        10:22 AM, 11/22/20 Jermika Olden A. Saverio Danker PT, DPT Physical Therapist - [facility]   [name] 11/22/2020, 10:22 AM

## 2020-11-22 NOTE — Progress Notes (Signed)
[name] Antigua, MD [address] [place], [place], [place], [address] [address], [place], [place], [place], [address] Phone: [phone]  Fax: [phone]   Subjective:  Patient reports small bowel movement this morning, with small amount of blood streaks. No abdominal pain, nausea or vomiting. Tolerating oral diet without difficulty  Objective: Exam: Vital signs in last 24 hours: Vitals:   11/21/20 2049 11/21/20 2319 11/22/20 0315 11/22/20 0506  BP: (!) 184/81 (!) 169/82 (!) 190/81 (!) 179/83  Pulse: 72 73 69 70  Resp: 18 16 16    Temp: 99.4 F (37.4 C) 98.3 F (36.8 C) 98 F (36.7 C)   TempSrc: Oral Oral Oral   SpO2: 100% 100% 96%   Weight:      Height:       Weight change:   Intake/Output Summary (Last 24 hours) at 11/22/2020 1749 Last data filed at 11/22/2020 0559 Gross per 24 hour  Intake 848.82 ml  Output 1438 ml  Net -589.18 ml    General: No acute distress, AAO x3 Abd: Soft, NT/ND, No HSM Skin: Warm, no rashes Neck: Supple, Trachea midline   Lab Results: Lab Results  Component Value Date   WBC 7.4 11/20/2020   HGB 8.9 (L) 11/20/2020   HCT 28.8 (L) 11/20/2020   MCV 84.7 11/20/2020   PLT 120 (L) 11/20/2020   Micro Results: Recent Results (from the past 240 hour(s))  Blood culture (routine x 2)     Status: None (Preliminary result)   Collection Time: 11/18/20  1:01 PM   Specimen: BLOOD  Result Value Ref Range Status   Specimen Description BLOOD RIGHT ANTECUBITAL  Final   Special Requests   Final    BOTTLES DRAWN AEROBIC AND ANAEROBIC Blood Culture results may not be optimal due to an excessive volume of blood received in culture bottles   Culture   Final    NO GROWTH 4 DAYS Performed at [facility], [address] [place], [place] [address]    Report Status PENDING  Incomplete  SARS Coronavirus 2 by RT PCR (hospital order, performed in [place] hospital lab) Nasopharyngeal Nasopharyngeal Swab     Status: None    Collection Time: 11/18/20  1:01 PM   Specimen: Nasopharyngeal Swab  Result Value Ref Range Status   SARS Coronavirus 2 NEGATIVE NEGATIVE Final    Comment: (NOTE) SARS-CoV-2 target nucleic acids are NOT DETECTED.  The SARS-CoV-2 RNA is generally detectable in upper and lower respiratory specimens during the acute phase of infection. The lowest concentration of SARS-CoV-2 viral copies this assay can detect is 250 copies / mL. A negative result does not preclude SARS-CoV-2 infection and should not be used as the sole basis for treatment or other patient management decisions.  A negative result may occur with improper specimen collection / handling, submission of specimen other than nasopharyngeal swab, presence of viral mutation(s) within the areas targeted by this assay, and inadequate number of viral copies (<250 copies / mL). A negative result must be combined with clinical observations, patient history, and epidemiological information.  Fact Sheet for Patients:   StrictlyIdeas.no  Fact Sheet for Healthcare Providers: BankingDealers.co.za  This test is not yet approved or  cleared by the [place] FDA and has been authorized for detection and/or diagnosis of SARS-CoV-2 by FDA under an Emergency Use Authorization (EUA).  This EUA will remain in effect (meaning this test can be used) for the duration of the COVID-19 declaration under Section 564(b)(1) of the Act,  21 U.S.C. section 360bbb-3(b)(1), unless the authorization is terminated or revoked sooner.  Performed at [facility], [address] [place], [place] [address]   Blood culture (routine x 2)     Status: None (Preliminary result)   Collection Time: 11/18/20  4:42 PM   Specimen: BLOOD  Result Value Ref Range Status   Specimen Description BLOOD LEFT ANTECUBITAL  Final   Special Requests   Final    BOTTLES DRAWN AEROBIC AND ANAEROBIC Blood Culture adequate  volume   Culture   Final    NO GROWTH 4 DAYS Performed at [facility], [address] [place], [place] [address]    Report Status PENDING  Incomplete   Studies/Results: MR BRAIN WO CONTRAST  Result Date: 11/21/2020 CLINICAL DATA:  Lethargy and confusion. EXAM: MRI HEAD WITHOUT CONTRAST TECHNIQUE: Multiplanar, multiecho pulse sequences of the brain and surrounding structures were obtained without intravenous contrast. COMPARISON:  09/12/2020 FINDINGS: Brain: No acute infarct, mass effect or extra-axial collection. Chronic blood products in the anterior right MCA territory. Hyperintense T2-weighted signal is moderately widespread throughout the white matter. Generalized volume loss without a clear lobar predilection. Old anterior right MCA territory infarct. Anterior left temporal lobe encephalomalacia. The midline structures are normal. Vascular: Major flow voids are preserved. Skull and upper cervical spine: Normal calvarium and skull base. Visualized upper cervical spine and soft tissues are normal. Sinuses/Orbits:No paranasal sinus fluid levels or advanced mucosal thickening. No mastoid or middle ear effusion. Normal orbits. IMPRESSION: 1. No acute intracranial abnormality. 2. Old right MCA territory infarct and findings of chronic small vessel disease. Electronically Signed   By: Ulyses Jarred M.D.   On: 11/21/2020 02:52   Medications:  Scheduled Meds: . amLODipine  10 mg Oral Daily  . atorvastatin  80 mg Oral Daily  . carvedilol  25 mg Oral BID  . cyanocobalamin  1,000 mcg Intramuscular Once  . epoetin (EPOGEN/PROCRIT) injection  4,000 Units Intravenous Q M,W,F-HD  . ferric citrate  420 mg Oral BID  . furosemide  80 mg Oral BID  . gabapentin  300 mg Oral QHS  . insulin aspart  0-5 Units Subcutaneous QHS  . insulin aspart  0-9 Units Subcutaneous TID WC  . levETIRAcetam  500 mg Oral Daily  . levETIRAcetam  500 mg Oral Q M,W,F-1800  . lisinopril  20 mg Oral Daily  .  pantoprazole  40 mg Intravenous Q12H  . sevelamer carbonate  800 mg Oral TID WC   Continuous Infusions: . sodium chloride 10 mL/hr at 11/22/20 0559  . pantoprazole (PROTONIX) 80 mg IVPB    . piperacillin-tazobactam (ZOSYN)  IV Stopped (11/22/20 0539)   PRN Meds:.sodium chloride, acetaminophen, hydrALAZINE, LORazepam, ondansetron   Assessment: Principal Problem:   Melena Active Problems:   Hypercholesteremia   Hypertension   Crohn's disease ([place])   Type 2 diabetes mellitus with kidney complication, with long-term current use of insulin (HCC)   End stage renal failure on dialysis ([place])   History of CVA (cerebrovascular accident)   Seizure disorder ([place])   Acute metabolic encephalopathy   Anemia in ESRD (end-stage renal disease) (HCC)   Chronic systolic CHF (congestive heart failure) (HCC)   Polyp of transverse colon    Plan: Patient underwent EGD and colonoscopy yesterday with Dr. Allen Norris EGD showed small hiatal hernia, duodenitis, normal stomach. Colonoscopy with several polyps removed, largest being 9 mm in size. Rectal erythema noted and biopsied. Pathology report pending Await pathology results  Would recommend repeat hemoglobin today  as last 1 was 2 days ago  Please page GI with any signs of active GI bleeding Patient currently hemodynamically stable Small streaks of blood in stool can be expected after his colonoscopy with biopsies yesterday. However, this should resolve. However, if this worsens please page GI for further evaluation   LOS: 4 days   [name] Antigua, MD 11/22/2020, 7:48 AM

## 2020-11-22 NOTE — TOC Progression Note (Signed)
Transition of Care [facility]) - Progression Note    Patient Details  Name: [name] MRN: [id number] Date of Birth: December 30, 1960  Transition of Care [facility]) CM/SW Contact  Elliot Gurney [place], [place] Phone Number: [phone] 11/22/2020, 2:37 PM  Clinical Narrative:    Phone call to [facility] to discuss referral for PT services. Per [facility], patient is still active and is just on hold status. Request made to contact them once patient is ready for discharge.  Atthew Coutant, LCSW Transitions of Care [phone]    Expected Discharge Plan: [place] Barriers to Discharge: Continued Medical Work up  Expected Discharge Plan and Services Expected Discharge Plan: [place]   Discharge Planning Services: CM Consult   Living arrangements for the past 2 months: Single Family Home                                       Social Determinants of Health (SDOH) Interventions    Readmission Risk Interventions Readmission Risk Prevention Plan 11/21/2020 11/20/2020  Transportation Screening Complete -  HRI or [place] - Complete  Palliative Care Screening - Not Applicable  Medication Review (RN Care Manager) - Complete  Some recent data might be hidden

## 2020-11-22 NOTE — Progress Notes (Signed)
PROGRESS NOTE    [name]  PNT:[id number] DOB: 02-12-1961 DOA: 11/18/2020 PCP: [name], MD    Brief Narrative:  [name] is a 60 y.o. male with medical history significant Crohn's Dz, ESRD-HD (MWF), HTN, HLD, DM, stroke, seizure, sCHF, anemia, ICH, PVD, DVT not anticoagulated, bilateral BKA, presents with AMS and rectal pain with bleeding and melena.  2/2-has not had any further melena.  Hematuria persists today. 2/3-EGD today. No cscope as nsg reported he did not finish is prep 2/4-s/p Cscope today. HD today too. Was confused yesterday post egd  2/5-Hg dropped from 8.9>>7. s/p 12 polyps removed yesterday.spoke to GI, recommend observing today to make sure hg remains stable.  Consultants:   Nephrology, GI  Procedures: Hemodialysis CT of abdomen/pelvis: 1. Marked circumferential thickening of the distal rectum and anus with suspected open fistula/dehiscence posteriorly. Adjacent skin and soft tissue thickening in the surrounding soft tissues likely reflecting chronic decubitus ulcer. Correlate with physical exam. 2. Prominent rectal stool ball. 3. Thickened appearance of the urinary bladder wall, which may be accentuated by underdistention. Correlate with urinalysis to exclude cystitis. 4. Cholelithiasis without evidence of acute cholecystitis. 5. Marked extensive atherosclerosis. 6. Right sided pleural thickening and pleural calcification. 7. No acute osseous findings or evidence of osteomyelitis. Findings suggestive of renal osteodystrophy   Antimicrobials:   Zosyn   Subjective: Has no complaints today. No n/v/d. Noticed small amount of blood in stool but reports much lighter today  Objective: Vitals:   11/21/20 2319 11/22/20 0315 11/22/20 0506 11/22/20 0853  BP: (!) 169/82 (!) 190/81 (!) 179/83 (!) 177/73  Pulse: 73 69 70 79  Resp: 16 16  18   Temp: 98.3 F (36.8 C) 98 F (36.7 C)    TempSrc: Oral Oral    SpO2: 100% 96%  100%  Weight:       Height:        Intake/Output Summary (Last 24 hours) at 11/22/2020 1019 Last data filed at 11/22/2020 0559 Gross per 24 hour  Intake 848.82 ml  Output 1438 ml  Net -589.18 ml   Filed Weights   11/18/20 1059 11/18/20 1628 11/21/20 1130  Weight: 66.7 kg 60.6 kg 60.6 kg    Examination: NAD, calm sitting at the side of the bed getting ready to walk with PT CTA, no wheeze rales rhonchi's Regular S1-S2 no gallops Soft benign positive bowel sounds Bilateral BKA Alert oriented x3, grossly intact Mood and affect appropriate in current setting    Data Reviewed: I have personally reviewed following labs and imaging studies  CBC: Recent Labs  Lab 11/19/20 0911 11/19/20 1822 11/20/20 0416 11/20/20 0932 11/22/20 0832  WBC 7.2 7.2 7.7 7.4 5.9  HGB 8.0* 7.0* 8.6* 8.9* 7.5*  HCT 25.8* 22.9* 27.2* 28.8* 25.0*  MCV 84.6 85.4 84.5 84.7 85.3  PLT 106* 98* 108* 120* 086*   Basic Metabolic Panel: Recent Labs  Lab 11/18/20 1101 11/19/20 0321 11/20/20 0416 11/21/20 1658  NA 138 138 136  --   K 3.6 3.6 3.8  --   CL 95* 97* 97*  --   CO2 27 26 25   --   GLUCOSE 161* 105* 71  --   BUN 30* 40* 25*  --   CREATININE 5.67* 6.99* 4.55*  --   CALCIUM 9.3 8.8* 8.6*  --   PHOS  --   --   --  3.7   GFR: Estimated Creatinine Clearance: 15.2 mL/min (A) (by C-G formula based on SCr of 4.55  mg/dL (H)). Liver Function Tests: No results for input(s): AST, ALT, ALKPHOS, BILITOT, PROT, ALBUMIN in the last 168 hours. No results for input(s): LIPASE, AMYLASE in the last 168 hours. Recent Labs  Lab 11/21/20 1658  AMMONIA 9   Coagulation Profile: Recent Labs  Lab 11/18/20 1101  INR 1.1   Cardiac Enzymes: No results for input(s): CKTOTAL, CKMB, CKMBINDEX, TROPONINI in the last 168 hours. BNP (last 3 results) No results for input(s): PROBNP in the last 8760 hours. HbA1C: No results for input(s): HGBA1C in the last 72 hours. CBG: Recent Labs  Lab 11/20/20 2100 11/21/20 0728  11/21/20 1134 11/21/20 2048 11/22/20 0814  GLUCAP 116* 123* 109* 79 130*   Lipid Profile: No results for input(s): CHOL, HDL, LDLCALC, TRIG, CHOLHDL, LDLDIRECT in the last 72 hours. Thyroid Function Tests: Recent Labs    11/21/20 1658  TSH 3.860   Anemia Panel: Recent Labs    11/21/20 1658  VITAMINB12 1,099*   Sepsis Labs: Recent Labs  Lab 11/18/20 1125  LATICACIDVEN 1.0    Recent Results (from the past 240 hour(s))  Blood culture (routine x 2)     Status: None (Preliminary result)   Collection Time: 11/18/20  1:01 PM   Specimen: BLOOD  Result Value Ref Range Status   Specimen Description BLOOD RIGHT ANTECUBITAL  Final   Special Requests   Final    BOTTLES DRAWN AEROBIC AND ANAEROBIC Blood Culture results may not be optimal due to an excessive volume of blood received in culture bottles   Culture   Final    NO GROWTH 4 DAYS Performed at [facility], [address] [place], [place] [address]    Report Status PENDING  Incomplete  SARS Coronavirus 2 by RT PCR (hospital order, performed in [place] hospital lab) Nasopharyngeal Nasopharyngeal Swab     Status: None   Collection Time: 11/18/20  1:01 PM   Specimen: Nasopharyngeal Swab  Result Value Ref Range Status   SARS Coronavirus 2 NEGATIVE NEGATIVE Final    Comment: (NOTE) SARS-CoV-2 target nucleic acids are NOT DETECTED.  The SARS-CoV-2 RNA is generally detectable in upper and lower respiratory specimens during the acute phase of infection. The lowest concentration of SARS-CoV-2 viral copies this assay can detect is 250 copies / mL. A negative result does not preclude SARS-CoV-2 infection and should not be used as the sole basis for treatment or other patient management decisions.  A negative result may occur with improper specimen collection / handling, submission of specimen other than nasopharyngeal swab, presence of viral mutation(s) within the areas targeted by this assay, and inadequate  number of viral copies (<250 copies / mL). A negative result must be combined with clinical observations, patient history, and epidemiological information.  Fact Sheet for Patients:   StrictlyIdeas.no  Fact Sheet for Healthcare Providers: BankingDealers.co.za  This test is not yet approved or  cleared by the [place] FDA and has been authorized for detection and/or diagnosis of SARS-CoV-2 by FDA under an Emergency Use Authorization (EUA).  This EUA will remain in effect (meaning this test can be used) for the duration of the COVID-19 declaration under Section 564(b)(1) of the Act, 21 U.S.C. section 360bbb-3(b)(1), unless the authorization is terminated or revoked sooner.  Performed at [facility], [address] [place], [place] [address]   Blood culture (routine x 2)     Status: None (Preliminary result)   Collection Time: 11/18/20  4:42 PM   Specimen: BLOOD  Result Value  Ref Range Status   Specimen Description BLOOD LEFT ANTECUBITAL  Final   Special Requests   Final    BOTTLES DRAWN AEROBIC AND ANAEROBIC Blood Culture adequate volume   Culture   Final    NO GROWTH 4 DAYS Performed at [facility], [address] [place], [place] [address]    Report Status PENDING  Incomplete         Radiology Studies: MR BRAIN WO CONTRAST  Result Date: 11/21/2020 CLINICAL DATA:  Lethargy and confusion. EXAM: MRI HEAD WITHOUT CONTRAST TECHNIQUE: Multiplanar, multiecho pulse sequences of the brain and surrounding structures were obtained without intravenous contrast. COMPARISON:  09/12/2020 FINDINGS: Brain: No acute infarct, mass effect or extra-axial collection. Chronic blood products in the anterior right MCA territory. Hyperintense T2-weighted signal is moderately widespread throughout the white matter. Generalized volume loss without a clear lobar predilection. Old anterior right MCA territory infarct. Anterior left  temporal lobe encephalomalacia. The midline structures are normal. Vascular: Major flow voids are preserved. Skull and upper cervical spine: Normal calvarium and skull base. Visualized upper cervical spine and soft tissues are normal. Sinuses/Orbits:No paranasal sinus fluid levels or advanced mucosal thickening. No mastoid or middle ear effusion. Normal orbits. IMPRESSION: 1. No acute intracranial abnormality. 2. Old right MCA territory infarct and findings of chronic small vessel disease. Electronically Signed   By: Ulyses Jarred M.D.   On: 11/21/2020 02:52        Scheduled Meds: . amLODipine  10 mg Oral Daily  . atorvastatin  80 mg Oral Daily  . carvedilol  25 mg Oral BID  . cyanocobalamin  1,000 mcg Intramuscular Once  . epoetin (EPOGEN/PROCRIT) injection  4,000 Units Intravenous Q M,W,F-HD  . ferric citrate  420 mg Oral BID  . furosemide  80 mg Oral BID  . gabapentin  300 mg Oral QHS  . hydrALAZINE  25 mg Oral Q8H  . insulin aspart  0-5 Units Subcutaneous QHS  . insulin aspart  0-9 Units Subcutaneous TID WC  . levETIRAcetam  500 mg Oral Daily  . levETIRAcetam  500 mg Oral Q M,W,F-1800  . lisinopril  20 mg Oral Daily  . pantoprazole  40 mg Intravenous Q12H  . sevelamer carbonate  800 mg Oral TID WC   Continuous Infusions: . sodium chloride 10 mL/hr at 11/22/20 0559  . pantoprazole (PROTONIX) 80 mg IVPB    . piperacillin-tazobactam (ZOSYN)  IV Stopped (11/22/20 0539)    Assessment & Plan:   Principal Problem:   Melena Active Problems:   Hypercholesteremia   Hypertension   Crohn's disease ([place])   Type 2 diabetes mellitus with kidney complication, with long-term current use of insulin (HCC)   End stage renal failure on dialysis ([place])   History of CVA (cerebrovascular accident)   Seizure disorder ([place])   Acute metabolic encephalopathy   Anemia in ESRD (end-stage renal disease) (HCC)   Chronic systolic CHF (congestive heart failure) (HCC)   Polyp of transverse  colon   Melena: Patient has some melena, denies nausea, vomiting, diarrhea.  He has rectal pain, which may be due to history of hidradenitis per his wife.  Patient has fever of 101 in ED.  Does not meet criteria for sepsis.  Since patient has history of Crohn's disease, not sure if patient has Crohn's disease flareup.   2/4-[place] EGD found with duodenitis and small hiatal hernia done on 2/3  Status post colonoscopy this a.m. -found with multiple polyps, erythematous mucosa in the rectum which was biopsied.  Poor examined portion of the ileum which was normal.   Will need to follow-up with GI as outpatient  2/5-spoke to Dr. Allen Norris yesterday and he did recommended discontinuing Zosyn as patient has no infection Hemoglobin dropped from 8.9>>>> 7.5 rest post several polyps removed yesterday with the largest being 9 mm in size Will monitor h/h today to make sure remains stable, if stable will dc in am. If hg <7 will transfuse.    Crohn's disease:  Patient on weekly [name] injection  2/5-s/p cscope as above findings    Hypercholesteremia Continue statin   End stage renal failure on dialysis (MWF):  Nephrology following  2/5-had HD yesterday Continue Epogen during dialysis   Hypertension Elevated Continue amlodipine, Coreg, lisinopril, and Lasix Add hydralazine 25 mg p.o. 3 times daily      Type 2 diabetes mellitus with kidney complication, with long-term current use of insulin ([place]): Recent A1c 6.7, well controlled.  Patient is taking NovoLog and Lantus -BG stable  Continue Lantus and R-ISS       History of CVA (cerebrovascular accident) Continue Lipitor  Seizure -Seizure precaution -When necessary Ativan for seizure -Continue Home medications: Keppra  Acute metabolic encephalopathy: Etiology is not clear.  CT head negative.  No focal neuro deficit on physical examination.  Possibly due to ongoing infection 2/3-spoke to wife who is concerned about patient being  confused in the evening and reports he is not at his baseline.  Although both times I have seen him in the morning patient is appropriate and not confused.  Wife would like neurology to be consulted. After much discussion we will obtain MRI of the brain as patient's wife states patient could not get up on the day of admission and she was scared that he had a stroke. Neurology consulted 3/4-mri negative for acute abn Neuro patient has chronic baseline delirium at times. Check B12, TSH and ammonia Nighttime worsening is common with hospital-based delirium.  Can add Seroquel if needed at 8 PM. Will need outpatient neurologic follow-up, wife favors [place] [facility]. Wife has concerns about patient going home.  Will consult PT for evaluation of home versus SNF   Anemia in ESRD (end-stage renal disease) ([place]) -Follow-up by CBC -Continue iron supplement  Chronic systolic CHF (congestive heart failure) ([place]): 2D echo on 09/13/2020 showed EF of 40-50% with grade 2 diastolic dysfunction.  Patient does not have respiratory distress.  No pulmonary edema on chest x-ray.  CHF is compensated. -Volume management by dialysis per renal        DVT prophylaxis: SCD Code Status: Full Family Communication: None at bedside  Status is: Inpatient  Remains inpatient appropriate because:Inpatient level of care appropriate due to severity of illness   Dispo: The patient is from: Home              Anticipated d/c is to: Home              Anticipated d/c date is: 1 day              Patient currently is not medically stable to d/c. Pt hg dropped  And had multiple polyps removed. Will need to monitor hg today to make sure remains stable v.s needing transfusion.   Difficult to place patient No          LOS: 4 days   Time spent: 35 minutes with more than 50% of COC    Nolberto Hanlon, MD [facility] Pager [phone]  If 7PM-7AM, please contact  night-coverage 11/22/2020, 10:19 AM

## 2020-11-22 NOTE — Progress Notes (Signed)
[facility], [place] 11/22/20  Subjective:   LOS: 4 Patient lying in bed in no acute distress, calm and pleasant. He received dialysis treatment yesterday after colonoscopy  Objective:  Vital signs in last 24 hours:  Temp:  [98 F (36.7 C)-99.4 F (37.4 C)] 98.8 F (37.1 C) (02/05 1113) Pulse Rate:  [63-79] 71 (02/05 1330) Resp:  [14-21] 18 (02/05 1330) BP: (168-200)/(69-85) 182/81 (02/05 1330) SpO2:  [96 %-100 %] 100 % (02/05 1330)  Weight change:  Filed Weights   11/18/20 1059 11/18/20 1628 11/21/20 1130  Weight: 66.7 kg 60.6 kg 60.6 kg    Intake/Output:    Intake/Output Summary (Last 24 hours) at 11/22/2020 1420 Last data filed at 11/22/2020 1351 Gross per 24 hour  Intake 1028.82 ml  Output 1538 ml  Net -509.18 ml     Physical Exam: General:  Resting in bed, in no acute distress  HEENT  normocephalic, atraumatic  Pulm/lungs  lungs clear to auscultation bilaterally, respiratory effort normal and symmetrical  CVS/Heart  S1-S2, no rubs or gallops  Abdomen:  Soft, non-tender, nondistended  Extremities:  Trace peripheral edema  Neurologic:  Awake, alert, forgetful  Skin: No masses or lesions  Access: Left arm AV fistula +bruit, + thrill       Basic Metabolic Panel:  Recent Labs  Lab 11/18/20 1101 11/19/20 0321 11/20/20 0416 11/21/20 1658  NA 138 138 136  --   K 3.6 3.6 3.8  --   CL 95* 97* 97*  --   CO2 27 26 25   --   GLUCOSE 161* 105* 71  --   BUN 30* 40* 25*  --   CREATININE 5.67* 6.99* 4.55*  --   CALCIUM 9.3 8.8* 8.6*  --   PHOS  --   --   --  3.7     CBC: Recent Labs  Lab 11/19/20 0911 11/19/20 1822 11/20/20 0416 11/20/20 0932 11/22/20 0832  WBC 7.2 7.2 7.7 7.4 5.9  HGB 8.0* 7.0* 8.6* 8.9* 7.5*  HCT 25.8* 22.9* 27.2* 28.8* 25.0*  MCV 84.6 85.4 84.5 84.7 85.3  PLT 106* 98* 108* 120* 118*      Lab Results  Component Value Date   HEPBSAG NON REACTIVE 09/13/2020      Microbiology:  Recent Results  (from the past 240 hour(s))  Blood culture (routine x 2)     Status: None (Preliminary result)   Collection Time: 11/18/20  1:01 PM   Specimen: BLOOD  Result Value Ref Range Status   Specimen Description BLOOD RIGHT ANTECUBITAL  Final   Special Requests   Final    BOTTLES DRAWN AEROBIC AND ANAEROBIC Blood Culture results may not be optimal due to an excessive volume of blood received in culture bottles   Culture   Final    NO GROWTH 4 DAYS Performed at [facility], [address] [place], [place] [address]    Report Status PENDING  Incomplete  SARS Coronavirus 2 by RT PCR (hospital order, performed in [place] hospital lab) Nasopharyngeal Nasopharyngeal Swab     Status: None   Collection Time: 11/18/20  1:01 PM   Specimen: Nasopharyngeal Swab  Result Value Ref Range Status   SARS Coronavirus 2 NEGATIVE NEGATIVE Final    Comment: (NOTE) SARS-CoV-2 target nucleic acids are NOT DETECTED.  The SARS-CoV-2 RNA is generally detectable in upper and lower respiratory specimens during the acute phase of infection. The lowest concentration of SARS-CoV-2 viral copies this assay can detect  is 250 copies / mL. A negative result does not preclude SARS-CoV-2 infection and should not be used as the sole basis for treatment or other patient management decisions.  A negative result may occur with improper specimen collection / handling, submission of specimen other than nasopharyngeal swab, presence of viral mutation(s) within the areas targeted by this assay, and inadequate number of viral copies (<250 copies / mL). A negative result must be combined with clinical observations, patient history, and epidemiological information.  Fact Sheet for Patients:   StrictlyIdeas.no  Fact Sheet for Healthcare Providers: BankingDealers.co.za  This test is not yet approved or  cleared by the [place] FDA and has been authorized for detection  and/or diagnosis of SARS-CoV-2 by FDA under an Emergency Use Authorization (EUA).  This EUA will remain in effect (meaning this test can be used) for the duration of the COVID-19 declaration under Section 564(b)(1) of the Act, 21 U.S.C. section 360bbb-3(b)(1), unless the authorization is terminated or revoked sooner.  Performed at [facility], [address] [place], [place] [address]   Blood culture (routine x 2)     Status: None (Preliminary result)   Collection Time: 11/18/20  4:42 PM   Specimen: BLOOD  Result Value Ref Range Status   Specimen Description BLOOD LEFT ANTECUBITAL  Final   Special Requests   Final    BOTTLES DRAWN AEROBIC AND ANAEROBIC Blood Culture adequate volume   Culture   Final    NO GROWTH 4 DAYS Performed at [facility], [address] [place], [place] [address]    Report Status PENDING  Incomplete    Coagulation Studies: No results for input(s): LABPROT, INR in the last 72 hours.  Urinalysis: No results for input(s): COLORURINE, LABSPEC, PHURINE, GLUCOSEU, HGBUR, BILIRUBINUR, KETONESUR, PROTEINUR, UROBILINOGEN, NITRITE, LEUKOCYTESUR in the last 72 hours.  Invalid input(s): APPERANCEUR    Imaging: MR BRAIN WO CONTRAST  Result Date: 11/21/2020 CLINICAL DATA:  Lethargy and confusion. EXAM: MRI HEAD WITHOUT CONTRAST TECHNIQUE: Multiplanar, multiecho pulse sequences of the brain and surrounding structures were obtained without intravenous contrast. COMPARISON:  09/12/2020 FINDINGS: Brain: No acute infarct, mass effect or extra-axial collection. Chronic blood products in the anterior right MCA territory. Hyperintense T2-weighted signal is moderately widespread throughout the white matter. Generalized volume loss without a clear lobar predilection. Old anterior right MCA territory infarct. Anterior left temporal lobe encephalomalacia. The midline structures are normal. Vascular: Major flow voids are preserved. Skull and upper cervical  spine: Normal calvarium and skull base. Visualized upper cervical spine and soft tissues are normal. Sinuses/Orbits:No paranasal sinus fluid levels or advanced mucosal thickening. No mastoid or middle ear effusion. Normal orbits. IMPRESSION: 1. No acute intracranial abnormality. 2. Old right MCA territory infarct and findings of chronic small vessel disease. Electronically Signed   By: Ulyses Jarred M.D.   On: 11/21/2020 02:52     Medications:   . sodium chloride 10 mL/hr at 11/22/20 0559   . amLODipine  10 mg Oral Daily  . atorvastatin  80 mg Oral Daily  . carvedilol  25 mg Oral BID  . cyanocobalamin  1,000 mcg Intramuscular Once  . epoetin (EPOGEN/PROCRIT) injection  4,000 Units Intravenous Q M,W,F-HD  . ferric citrate  420 mg Oral BID  . furosemide  80 mg Oral BID  . gabapentin  300 mg Oral QHS  . hydrALAZINE  25 mg Oral Q8H  . insulin aspart  0-5 Units Subcutaneous QHS  . insulin aspart  0-9 Units Subcutaneous TID  WC  . levETIRAcetam  500 mg Oral Daily  . levETIRAcetam  500 mg Oral Q M,W,F-1800  . lisinopril  20 mg Oral Daily  . pantoprazole  40 mg Intravenous Q12H  . sevelamer carbonate  800 mg Oral TID WC   sodium chloride, acetaminophen, hydrALAZINE, LORazepam, ondansetron  Assessment/ Plan:  60 y.o. male with ESRD on HD MWF followed by [facility] nephrology, diabetes mellitus type 2, history of DVT,bilateralbelow the knee amputations, hypertension, history of intracerebral hemorrhage, anemia of chronic kidney disease, secondary hyperparathyroidism, seizure disorder  was admitted on 11/18/2020 for  Principal Problem:   Melena Active Problems:   Hypercholesteremia   Hypertension   Crohn's disease ([place])   Type 2 diabetes mellitus with kidney complication, with long-term current use of insulin (HCC)   End stage renal failure on dialysis ([place])   History of CVA (cerebrovascular accident)   Seizure disorder ([place])   Acute metabolic encephalopathy   Anemia in ESRD (end-stage renal  disease) ([place])   Chronic systolic CHF (congestive heart failure) (HCC)   Polyp of transverse colon  Melena [K92.1] Bloody diarrhea [R19.7] Fever, unspecified fever cause [R50.9] Altered mental status, unspecified altered mental status type [R41.82] Rectal bleeding [K62.5]   [facility] nephrology/[address]/ MWF  #. ESRD -Patient received dialysis treatment yesterday Volume and electrolyte status acceptable No additional treatment required today We will continue Monday Wednesday Friday schedule   #. Anemia of CKD  Lab Results  Component Value Date   HGB 7.5 (L) 11/22/2020   We will continue Epogen with dialysis treatment  #. Secondary hyperparathyroidism of renal origin N 25.81      Component Value Date/Time   PTH 283 (H) 01/08/2019 0516   Lab Results  Component Value Date   PHOS 3.7 11/21/2020  We will continue monitoring bone mineral metabolism parameters   #. Diabetes type 2 with CKD Hemoglobin A1C (%)  Date Value  07/24/2014 8.3 (H)   Hgb A1c MFr Bld (%)  Date Value  09/13/2020 6.7 (H)  Blood glucose readings within acceptable range  #GI bleed Patient underwent EGD and colonoscopy yesterday, with biopsy He reports small streaks of bright red blood in stool, otherwise no complaints GI team following   LOS: [place] 2/5/20222:20 PM   .   [facility] [place], [place]

## 2020-11-22 NOTE — Progress Notes (Signed)
He is reportedly more appropriate and less confused.  On my evaluation, he is working with physical therapy, oriented to place, month, year.  He and his wife refused EEG yesterday, I do not think that this is urgent and could be done as an outpatient if needed at a later date.  Impression: 60 year old male with known major neurocognitive disorder (likely vascular dementia) who has developed multifactorial delirium in the setting of bowel wall inflammation (which could allow some contribution of endotoxin from gram-negatives), situational change with hospitalization, and possible medication effects.    No further inpatient neurological work-up needed at this time, please call with further questions or concerns.  Roland Rack, MD [facility] [phone]  If 7pm- 7am, please page neurology on call as listed in [place].

## 2020-11-23 DIAGNOSIS — K921 Melena: Secondary | ICD-10-CM | POA: Diagnosis not present

## 2020-11-23 DIAGNOSIS — K50919 Crohn's disease, unspecified, with unspecified complications: Secondary | ICD-10-CM | POA: Diagnosis not present

## 2020-11-23 LAB — PREPARE RBC (CROSSMATCH)

## 2020-11-23 LAB — CBC
HCT: 23.8 % — ABNORMAL LOW (ref 39.0–52.0)
Hemoglobin: 7.3 g/dL — ABNORMAL LOW (ref 13.0–17.0)
MCH: 25.9 pg — ABNORMAL LOW (ref 26.0–34.0)
MCHC: 30.7 g/dL (ref 30.0–36.0)
MCV: 84.4 fL (ref 80.0–100.0)
Platelets: 109 10*3/uL — ABNORMAL LOW (ref 150–400)
RBC: 2.82 MIL/uL — ABNORMAL LOW (ref 4.22–5.81)
RDW: 18.2 % — ABNORMAL HIGH (ref 11.5–15.5)
WBC: 6.5 10*3/uL (ref 4.0–10.5)
nRBC: 0 % (ref 0.0–0.2)

## 2020-11-23 LAB — CULTURE, BLOOD (ROUTINE X 2)
Culture: NO GROWTH
Culture: NO GROWTH
Special Requests: ADEQUATE

## 2020-11-23 LAB — HEMOGLOBIN: Hemoglobin: 8.9 g/dL — ABNORMAL LOW (ref 13.0–17.0)

## 2020-11-23 LAB — GLUCOSE, CAPILLARY
Glucose-Capillary: 155 mg/dL — ABNORMAL HIGH (ref 70–99)
Glucose-Capillary: 201 mg/dL — ABNORMAL HIGH (ref 70–99)
Glucose-Capillary: 159 mg/dL — ABNORMAL HIGH (ref 70–99)
Glucose-Capillary: 146 mg/dL — ABNORMAL HIGH (ref 70–99)

## 2020-11-23 MED ORDER — SODIUM CHLORIDE 0.9% IV SOLUTION: Freq: Once | INTRAVENOUS | Status: AC

## 2020-11-23 MED ORDER — ACETAMINOPHEN 325 MG PO TABS
650.0000 mg | ORAL_TABLET | Freq: Once | ORAL | Status: AC
  Administered 2020-11-23: 650 mg via ORAL
  Filled 2020-11-23: qty 2

## 2020-11-23 NOTE — Progress Notes (Addendum)
PROGRESS NOTE    [name]  IDP:[id number] DOB: 13-Nov-1960 DOA: 11/18/2020 PCP: [name], MD    Brief Narrative:  [name] is a 60 y.o. male with medical history significant Crohn's Dz, ESRD-HD (MWF), HTN, HLD, DM, stroke, seizure, sCHF, anemia, ICH, PVD, DVT not anticoagulated, bilateral BKA, presents with AMS and rectal pain with bleeding and melena.  2/2-has not had any further melena.  Hematuria persists today. 2/3-EGD today. No cscope as nsg reported he did not finish is prep 2/4-s/p Cscope today. HD today too. Was confused yesterday post egd  2/5-Hg dropped from 8.9>>7. s/p 12 polyps removed yesterday.spoke to GI, recommend observing today to make sure hg remains stable. 2/6-got transfused last night 1 unit for hemoglobin of 6.6.  This a.m. hemoglobin 7.3   Consultants:   Nephrology, GI  Procedures: Hemodialysis CT of abdomen/pelvis: 1. Marked circumferential thickening of the distal rectum and anus with suspected open fistula/dehiscence posteriorly. Adjacent skin and soft tissue thickening in the surrounding soft tissues likely reflecting chronic decubitus ulcer. Correlate with physical exam. 2. Prominent rectal stool ball. 3. Thickened appearance of the urinary bladder wall, which may be accentuated by underdistention. Correlate with urinalysis to exclude cystitis. 4. Cholelithiasis without evidence of acute cholecystitis. 5. Marked extensive atherosclerosis. 6. Right sided pleural thickening and pleural calcification. 7. No acute osseous findings or evidence of osteomyelitis. Findings suggestive of renal osteodystrophy   Antimicrobials:   Zosyn   Subjective: Has no complaints other than feeling cold.  No nausea, vomiting, shortness of breath or chest pain  Objective: Vitals:   11/22/20 2334 11/23/20 0307 11/23/20 0312 11/23/20 0430  BP: (!) 173/75 (!) 193/85 (!) 193/85 (!) 147/72  Pulse: 69 68 67   Resp: 18 18    Temp: 98.7 F (37.1 C)  98.2 F (36.8 C) 98.2 F (36.8 C)   TempSrc:  Oral Oral   SpO2: 100% 100% 100%   Weight:      Height:        Intake/Output Summary (Last 24 hours) at 11/23/2020 0818 Last data filed at 11/23/2020 0308 Gross per 24 hour  Intake 1202.03 ml  Output 100 ml  Net 1102.03 ml   Filed Weights   11/18/20 1059 11/18/20 1628 11/21/20 1130  Weight: 66.7 kg 60.6 kg 60.6 kg    Examination: Lying in bed, NAD, comfortable CTA, no wheeze rales rhonchi's Regular S1-S2 no gallops Soft benign positive bowel sounds Bilateral AKA Alert oriented x3 Mood and affect appropriate in current setting    Data Reviewed: I have personally reviewed following labs and imaging studies  CBC: Recent Labs  Lab 11/19/20 1822 11/20/20 0416 11/20/20 0932 11/22/20 0832 11/22/20 2029 11/23/20 0546  WBC 7.2 7.7 7.4 5.9  --  6.5  HGB 7.0* 8.6* 8.9* 7.5* 6.6* 7.3*  HCT 22.9* 27.2* 28.8* 25.0* 21.4* 23.8*  MCV 85.4 84.5 84.7 85.3  --  84.4  PLT 98* 108* 120* 118*  --  443*   Basic Metabolic Panel: Recent Labs  Lab 11/18/20 1101 11/19/20 0321 11/20/20 0416 11/21/20 1658  NA 138 138 136  --   K 3.6 3.6 3.8  --   CL 95* 97* 97*  --   CO2 27 26 25   --   GLUCOSE 161* 105* 71  --   BUN 30* 40* 25*  --   CREATININE 5.67* 6.99* 4.55*  --   CALCIUM 9.3 8.8* 8.6*  --   PHOS  --   --   --  3.7   GFR: Estimated Creatinine Clearance: 15.2 mL/min (A) (by C-G formula based on SCr of 4.55 mg/dL (H)). Liver Function Tests: No results for input(s): AST, ALT, ALKPHOS, BILITOT, PROT, ALBUMIN in the last 168 hours. No results for input(s): LIPASE, AMYLASE in the last 168 hours. Recent Labs  Lab 11/21/20 1658  AMMONIA 9   Coagulation Profile: Recent Labs  Lab 11/18/20 1101  INR 1.1   Cardiac Enzymes: No results for input(s): CKTOTAL, CKMB, CKMBINDEX, TROPONINI in the last 168 hours. BNP (last 3 results) No results for input(s): PROBNP in the last 8760 hours. HbA1C: No results for input(s): HGBA1C in the  last 72 hours. CBG: Recent Labs  Lab 11/22/20 0814 11/22/20 1152 11/22/20 1623 11/22/20 2124 11/23/20 0749  GLUCAP 130* 154* 143* 192* 155*   Lipid Profile: No results for input(s): CHOL, HDL, LDLCALC, TRIG, CHOLHDL, LDLDIRECT in the last 72 hours. Thyroid Function Tests: Recent Labs    11/21/20 1658  TSH 3.860   Anemia Panel: Recent Labs    11/21/20 1658  VITAMINB12 1,099*   Sepsis Labs: Recent Labs  Lab 11/18/20 1125  LATICACIDVEN 1.0    Recent Results (from the past 240 hour(s))  Blood culture (routine x 2)     Status: None   Collection Time: 11/18/20  1:01 PM   Specimen: BLOOD  Result Value Ref Range Status   Specimen Description BLOOD RIGHT ANTECUBITAL  Final   Special Requests   Final    BOTTLES DRAWN AEROBIC AND ANAEROBIC Blood Culture results may not be optimal due to an excessive volume of blood received in culture bottles   Culture   Final    NO GROWTH 5 DAYS Performed at [facility], [address], [place], [place] [address]    Report Status 11/23/2020 FINAL  Final  SARS Coronavirus 2 by RT PCR (hospital order, performed in [facility] hospital lab) Nasopharyngeal Nasopharyngeal Swab     Status: None   Collection Time: 11/18/20  1:01 PM   Specimen: Nasopharyngeal Swab  Result Value Ref Range Status   SARS Coronavirus 2 NEGATIVE NEGATIVE Final    Comment: (NOTE) SARS-CoV-2 target nucleic acids are NOT DETECTED.  The SARS-CoV-2 RNA is generally detectable in upper and lower respiratory specimens during the acute phase of infection. The lowest concentration of SARS-CoV-2 viral copies this assay can detect is 250 copies / mL. A negative result does not preclude SARS-CoV-2 infection and should not be used as the sole basis for treatment or other patient management decisions.  A negative result may occur with improper specimen collection / handling, submission of specimen other than nasopharyngeal swab, presence of viral mutation(s) within  the areas targeted by this assay, and inadequate number of viral copies (<250 copies / mL). A negative result must be combined with clinical observations, patient history, and epidemiological information.  Fact Sheet for Patients:   StrictlyIdeas.no  Fact Sheet for Healthcare Providers: BankingDealers.co.za  This test is not yet approved or  cleared by the [place] FDA and has been authorized for detection and/or diagnosis of SARS-CoV-2 by FDA under an Emergency Use Authorization (EUA).  This EUA will remain in effect (meaning this test can be used) for the duration of the COVID-19 declaration under Section 564(b)(1) of the Act, 21 U.S.C. section 360bbb-3(b)(1), unless the authorization is terminated or revoked sooner.  Performed at [facility], [address] [place], [place] [address]   Blood culture (routine x 2)     Status: None  Collection Time: 11/18/20  4:42 PM   Specimen: BLOOD  Result Value Ref Range Status   Specimen Description BLOOD LEFT ANTECUBITAL  Final   Special Requests   Final    BOTTLES DRAWN AEROBIC AND ANAEROBIC Blood Culture adequate volume   Culture   Final    NO GROWTH 5 DAYS Performed at [facility], [address] [place], [place] [address]    Report Status 11/23/2020 FINAL  Final         Radiology Studies: No results found.      Scheduled Meds: . amLODipine  10 mg Oral Daily  . atorvastatin  80 mg Oral Daily  . carvedilol  25 mg Oral BID  . cyanocobalamin  1,000 mcg Intramuscular Once  . epoetin (EPOGEN/PROCRIT) injection  4,000 Units Intravenous Q M,W,F-HD  . ferric citrate  420 mg Oral BID  . furosemide  80 mg Oral BID  . gabapentin  300 mg Oral QHS  . hydrALAZINE  25 mg Oral Q8H  . insulin aspart  0-5 Units Subcutaneous QHS  . insulin aspart  0-9 Units Subcutaneous TID WC  . levETIRAcetam  500 mg Oral Daily  . levETIRAcetam  500 mg Oral Q M,W,F-1800  .  lisinopril  20 mg Oral Daily  . pantoprazole  40 mg Intravenous Q12H  . sevelamer carbonate  800 mg Oral TID WC   Continuous Infusions: . sodium chloride 10 mL/hr at 11/22/20 0559    Assessment & Plan:   Principal Problem:   Melena Active Problems:   Hypercholesteremia   Hypertension   Crohn's disease ([place])   Type 2 diabetes mellitus with kidney complication, with long-term current use of insulin (HCC)   End stage renal failure on dialysis ([place])   History of CVA (cerebrovascular accident)   Seizure disorder ([place])   Acute metabolic encephalopathy   Anemia in ESRD (end-stage renal disease) (HCC)   Chronic systolic CHF (congestive heart failure) (HCC)   Polyp of transverse colon   Melena: Patient has some melena, denies nausea, vomiting, diarrhea.  He has rectal pain, which may be due to history of hidradenitis per his wife.  Patient has fever of 101 in ED.  Does not meet criteria for sepsis.  Since patient has history of Crohn's disease, not sure if patient has Crohn's disease flareup.   2/4-[place] EGD found with duodenitis and small hiatal hernia done on 2/3  Status post colonoscopy this a.m. -found with multiple polyps, erythematous mucosa in the rectum which was biopsied.  Poor examined portion of the ileum which was normal.   Will need to follow-up with GI as outpatient  2/5-spoke to Dr. Allen Norris yesterday and he did recommended discontinuing Zosyn as patient has no infection Hemoglobin dropped from 8.9>>>> 7.5 rest post several polyps removed yesterday with the largest being 9 mm in size 2/6-s/p 1 Unit prbc last night, hg this am 7.3, however pt had more bloody stool. Will transfuse 1 unit prbc now over 4 hrs Hemodynamically he is stable Continue PPI IV twice daily Avoid NSAIDs Serial CBC and transfuse as needed if hemoglobin 7 or less Possible scope in am if continues to bleed   Crohn's disease:  2/6-patient on weekly [name] injections  Status post C-scope as mentioned  above findings      Hypercholesteremia Continue statin   End stage renal failure on dialysis (MWF):  G following  HD in a.m.  Continue Epogen during dialysis     Hypertension Elevated Continue amlodipine, Coreg, lisinopril, and  Lasix Add hydralazine 25 mg p.o. 3 times daily      Type 2 diabetes mellitus with kidney complication, with long-term current use of insulin ([place]): Recent A1c 6.7, well controlled.  Patient is taking NovoLog and Lantus -BG stable  Continue Lantus and R-ISS       History of CVA (cerebrovascular accident) Continue Lipitor  Seizure -Seizure precaution -When necessary Ativan for seizure -Continue Home medications: Keppra  Acute metabolic encephalopathy: Etiology is not clear.  CT head negative.  No focal neuro deficit on physical examination.  Possibly due to ongoing infection 2/3-spoke to wife who is concerned about patient being confused in the evening and reports he is not at his baseline.  Although both times I have seen him in the morning patient is appropriate and not confused.  Wife would like neurology to be consulted. After much discussion we will obtain MRI of the brain as patient's wife states patient could not get up on the day of admission and she was scared that he had a stroke. Neurology consulted 3/4-mri negative for acute abn Neuro patient has chronic baseline delirium at times. Check B12, TSH and ammonia Nighttime worsening is common with hospital-based delirium.  Can add Seroquel if needed at 8 PM. Will need outpatient neurologic follow-up, wife favors [facility]. Wife has concerns about patient going home.  2/6-PT recommends Home with HH  Anemia in ESRD (end-stage renal disease) ([place]) -Follow-up by CBC -Continue iron supplement Continue Epogen during HD  Chronic systolic CHF (congestive heart failure) ([place]): 2D echo on 09/13/2020 showed EF of 40-50% with grade 2 diastolic dysfunction.  Patient does not have  respiratory distress.  No pulmonary edema on chest x-ray.  CHF is compensated. -Volume management by dialysis per renal        DVT prophylaxis: SCD Code Status: Full Family Communication: None at bedside  Status is: Inpatient  Remains inpatient appropriate because:Inpatient level of care appropriate due to severity of illness   Dispo: The patient is from: Home              Anticipated d/c is to: Home              Anticipated d/c date is: 1 -2 days              Patient currently is not medically stable to d/c. he is bleeding and requiring transfusion again, possibly for colonoscopy in am   Difficult to place patient No          LOS: 5 days   Time spent: 35 minutes with more than 50% of COC    Nolberto Hanlon, MD [facility] Pager [phone]  If 7PM-7AM, please contact night-coverage 11/23/2020, 8:18 AM

## 2020-11-23 NOTE — Progress Notes (Signed)
[facility], [place] 11/23/20  Subjective:   LOS: 5 Patient found sitting up at the side of the bed, nursing staff at the bedside assisting with personal care.  He denies worsening shortness of breath, nausea or vomiting, appetite fair.    Objective:  Vital signs in last 24 hours:  Temp:  [97.7 F (36.5 C)-98.9 F (37.2 C)] 98.5 F (36.9 C) (02/06 1324) Pulse Rate:  [67-72] 67 (02/06 1324) Resp:  [16-20] 18 (02/06 1324) BP: (147-193)/(71-89) 181/82 (02/06 1324) SpO2:  [97 %-100 %] 100 % (02/06 1324)  Weight change:  Filed Weights   11/18/20 1059 11/18/20 1628 11/21/20 1130  Weight: 66.7 kg 60.6 kg 60.6 kg    Intake/Output:    Intake/Output Summary (Last 24 hours) at 11/23/2020 1349 Last data filed at 11/23/2020 1043 Gross per 24 hour  Intake 1202.03 ml  Output -  Net 1202.03 ml     Physical Exam: General:  Pleasant, appears comfortable in bed  HEENT  normocephalic, atraumatic  Pulm/lungs  respirations symmetrical and unlabored, lungs clear  CVS/Heart  S1-S2, no rubs or gallops  Abdomen:  Soft, mild tenderness+  Extremities:  Bilateral lower extremity amputated, with prosthesis, no edema noted on upper legs or arms  Neurologic:  Able to answer simple questions appropriately, but forgetful  Skin: No masses or lesions  Access: Left arm AV fistula +bruit, + thrill       Basic Metabolic Panel:  Recent Labs  Lab 11/18/20 1101 11/19/20 0321 11/20/20 0416 11/21/20 1658  NA 138 138 136  --   K 3.6 3.6 3.8  --   CL 95* 97* 97*  --   CO2 27 26 25   --   GLUCOSE 161* 105* 71  --   BUN 30* 40* 25*  --   CREATININE 5.67* 6.99* 4.55*  --   CALCIUM 9.3 8.8* 8.6*  --   PHOS  --   --   --  3.7     CBC: Recent Labs  Lab 11/19/20 1822 11/20/20 0416 11/20/20 0932 11/22/20 0832 11/22/20 2029 11/23/20 0546  WBC 7.2 7.7 7.4 5.9  --  6.5  HGB 7.0* 8.6* 8.9* 7.5* 6.6* 7.3*  HCT 22.9* 27.2* 28.8* 25.0* 21.4* 23.8*  MCV 85.4 84.5 84.7 85.3   --  84.4  PLT 98* 108* 120* 118*  --  109*      Lab Results  Component Value Date   HEPBSAG NON REACTIVE 09/13/2020      Microbiology:  Recent Results (from the past 240 hour(s))  Blood culture (routine x 2)     Status: None   Collection Time: 11/18/20  1:01 PM   Specimen: BLOOD  Result Value Ref Range Status   Specimen Description BLOOD RIGHT ANTECUBITAL  Final   Special Requests   Final    BOTTLES DRAWN AEROBIC AND ANAEROBIC Blood Culture results may not be optimal due to an excessive volume of blood received in culture bottles   Culture   Final    NO GROWTH 5 DAYS Performed at [facility], [address] [place], [place] [address]    Report Status 11/23/2020 FINAL  Final  SARS Coronavirus 2 by RT PCR (hospital order, performed in [facility] hospital lab) Nasopharyngeal Nasopharyngeal Swab     Status: None   Collection Time: 11/18/20  1:01 PM   Specimen: Nasopharyngeal Swab  Result Value Ref Range Status   SARS Coronavirus 2 NEGATIVE NEGATIVE Final    Comment: (NOTE) SARS-CoV-2  target nucleic acids are NOT DETECTED.  The SARS-CoV-2 RNA is generally detectable in upper and lower respiratory specimens during the acute phase of infection. The lowest concentration of SARS-CoV-2 viral copies this assay can detect is 250 copies / mL. A negative result does not preclude SARS-CoV-2 infection and should not be used as the sole basis for treatment or other patient management decisions.  A negative result may occur with improper specimen collection / handling, submission of specimen other than nasopharyngeal swab, presence of viral mutation(s) within the areas targeted by this assay, and inadequate number of viral copies (<250 copies / mL). A negative result must be combined with clinical observations, patient history, and epidemiological information.  Fact Sheet for Patients:   StrictlyIdeas.no  Fact Sheet for Healthcare  Providers: BankingDealers.co.za  This test is not yet approved or  cleared by the [place] FDA and has been authorized for detection and/or diagnosis of SARS-CoV-2 by FDA under an Emergency Use Authorization (EUA).  This EUA will remain in effect (meaning this test can be used) for the duration of the COVID-19 declaration under Section 564(b)(1) of the Act, 21 U.S.C. section 360bbb-3(b)(1), unless the authorization is terminated or revoked sooner.  Performed at [facility], [address] [place], [place] [address]   Blood culture (routine x 2)     Status: None   Collection Time: 11/18/20  4:42 PM   Specimen: BLOOD  Result Value Ref Range Status   Specimen Description BLOOD LEFT ANTECUBITAL  Final   Special Requests   Final    BOTTLES DRAWN AEROBIC AND ANAEROBIC Blood Culture adequate volume   Culture   Final    NO GROWTH 5 DAYS Performed at [facility], [address], [place], [place] [address]    Report Status 11/23/2020 FINAL  Final    Coagulation Studies: No results for input(s): LABPROT, INR in the last 72 hours.  Urinalysis: No results for input(s): COLORURINE, LABSPEC, PHURINE, GLUCOSEU, HGBUR, BILIRUBINUR, KETONESUR, PROTEINUR, UROBILINOGEN, NITRITE, LEUKOCYTESUR in the last 72 hours.  Invalid input(s): APPERANCEUR    Imaging: No results found.   Medications:   . sodium chloride 10 mL/hr at 11/22/20 0559   . amLODipine  10 mg Oral Daily  . atorvastatin  80 mg Oral Daily  . carvedilol  25 mg Oral BID  . cyanocobalamin  1,000 mcg Intramuscular Once  . epoetin (EPOGEN/PROCRIT) injection  4,000 Units Intravenous Q M,W,F-HD  . ferric citrate  420 mg Oral BID  . furosemide  80 mg Oral BID  . gabapentin  300 mg Oral QHS  . hydrALAZINE  25 mg Oral Q8H  . insulin aspart  0-5 Units Subcutaneous QHS  . insulin aspart  0-9 Units Subcutaneous TID WC  . levETIRAcetam  500 mg Oral Daily  . levETIRAcetam  500 mg Oral Q  M,W,F-1800  . lisinopril  20 mg Oral Daily  . pantoprazole  40 mg Intravenous Q12H  . sevelamer carbonate  800 mg Oral TID WC   sodium chloride, acetaminophen, hydrALAZINE, LORazepam, ondansetron  Assessment/ Plan:  60 y.o. male with ESRD on HD MWF followed by [facility] nephrology, diabetes mellitus type 2, history of DVT,bilateralbelow the knee amputations, hypertension, history of intracerebral hemorrhage, anemia of chronic kidney disease, secondary hyperparathyroidism, seizure disorder  was admitted on 11/18/2020 for  Principal Problem:   Melena Active Problems:   Hypercholesteremia   Hypertension   Crohn's disease ([place])   Type 2 diabetes mellitus with kidney complication, with long-term current use  of insulin ([place])   End stage renal failure on dialysis [facility])   History of CVA (cerebrovascular accident)   Seizure disorder ([place])   Acute metabolic encephalopathy   Anemia in ESRD (end-stage renal disease) ([place])   Chronic systolic CHF (congestive heart failure) (HCC)   Polyp of transverse colon  Melena [K92.1] Bloody diarrhea [R19.7] Fever, unspecified fever cause [R50.9] Altered mental status, unspecified altered mental status type [R41.82] Rectal bleeding [K62.5]   [facility] neph[facility]/[address]/ MWF  #. ESRD Received dialysis treatment on Friday We will plan for dialysis tomorrow  Volume and electrolyte status acceptable today   #. Anemia of CKD  Lab Results  Component Value Date   HGB 7.3 (L) 11/23/2020   Continue Epogen 4000 units Mondays, Wednesdays, and Fridays with HD  #. Secondary hyperparathyroidism of renal origin N 25.81      Component Value Date/Time   PTH 283 (H) 01/08/2019 0516   Lab Results  Component Value Date   PHOS 3.7 11/21/2020   Continue sevelamer 800 mg p.o. 3 times daily   #. Diabetes type 2 with CKD Hemoglobin A1C (%)  Date Value  07/24/2014 8.3 (H)   Hgb A1c MFr Bld (%)  Date Value  09/13/2020 6.7 (H)  Blood glucose readings  within acceptable range  #GI bleed Patient denies active rectal bleeding, but reports stools are still dark in color EGD, colonoscopy, and biopsy was done on Friday by the GI team   LOS: [place] 2/6/20221:49 PM   .   [facility] [place], [place]

## 2020-11-23 NOTE — Progress Notes (Signed)
Pt had another bloody stool after blood completion.

## 2020-11-23 NOTE — Progress Notes (Addendum)
Vonda Antigua, MD [address], [place], [place], [place], [address] [address], [place], [place], [place], [address] Phone: [phone]  Fax: [phone]   Subjective: Patient sitting up in bed comfortably.  Ate solid food diet this morning without difficulty.  Does report bright red blood per rectum this morning.  Hemodynamically stable.   Objective: Exam: Vital signs in last 24 hours: Vitals:   11/23/20 0307 11/23/20 0312 11/23/20 0430 11/23/20 1030  BP: (!) 193/85 (!) 193/85 (!) 147/72 (!) 192/89  Pulse: 68 67  72  Resp: 18   19  Temp: 98.2 F (36.8 C) 98.2 F (36.8 C)  98 F (36.7 C)  TempSrc: Oral Oral  Oral  SpO2: 100% 100%  100%  Weight:      Height:       Weight change:   Intake/Output Summary (Last 24 hours) at 11/23/2020 1153 Last data filed at 11/23/2020 1043 Gross per 24 hour  Intake 1202.03 ml  Output -  Net 1202.03 ml    General: No acute distress, AAO x3 Abd: Soft, NT/ND, No HSM Skin: Warm, no rashes Neck: Supple, Trachea midline   Lab Results: Lab Results  Component Value Date   WBC 6.5 11/23/2020   HGB 7.3 (L) 11/23/2020   HCT 23.8 (L) 11/23/2020   MCV 84.4 11/23/2020   PLT 109 (L) 11/23/2020   Micro Results: Recent Results (from the past 240 hour(s))  Blood culture (routine x 2)     Status: None   Collection Time: 11/18/20  1:01 PM   Specimen: BLOOD  Result Value Ref Range Status   Specimen Description BLOOD RIGHT ANTECUBITAL  Final   Special Requests   Final    BOTTLES DRAWN AEROBIC AND ANAEROBIC Blood Culture results may not be optimal due to an excessive volume of blood received in culture bottles   Culture   Final    NO GROWTH 5 DAYS Performed at [facility], [address] [place], [place] [address]    Report Status 11/23/2020 FINAL  Final  SARS Coronavirus 2 by RT PCR (hospital order, performed in [place] hospital lab) Nasopharyngeal Nasopharyngeal Swab     Status: None   Collection Time: 11/18/20   1:01 PM   Specimen: Nasopharyngeal Swab  Result Value Ref Range Status   SARS Coronavirus 2 NEGATIVE NEGATIVE Final    Comment: (NOTE) SARS-CoV-2 target nucleic acids are NOT DETECTED.  The SARS-CoV-2 RNA is generally detectable in upper and lower respiratory specimens during the acute phase of infection. The lowest concentration of SARS-CoV-2 viral copies this assay can detect is 250 copies / mL. A negative result does not preclude SARS-CoV-2 infection and should not be used as the sole basis for treatment or other patient management decisions.  A negative result may occur with improper specimen collection / handling, submission of specimen other than nasopharyngeal swab, presence of viral mutation(s) within the areas targeted by this assay, and inadequate number of viral copies (<250 copies / mL). A negative result must be combined with clinical observations, patient history, and epidemiological information.  Fact Sheet for Patients:   StrictlyIdeas.no  Fact Sheet for Healthcare Providers: BankingDealers.co.za  This test is not yet approved or  cleared by the [place] FDA and has been authorized for detection and/or diagnosis of SARS-CoV-2 by FDA under an Emergency Use Authorization (EUA).  This EUA will remain in effect (meaning this test can be used) for the duration of the COVID-19 declaration under Section 564(b)(1) of  the Act, 21 U.S.C. section 360bbb-3(b)(1), unless the authorization is terminated or revoked sooner.  Performed at [facility], [address] [place], [place] [address]   Blood culture (routine x 2)     Status: None   Collection Time: 11/18/20  4:42 PM   Specimen: BLOOD  Result Value Ref Range Status   Specimen Description BLOOD LEFT ANTECUBITAL  Final   Special Requests   Final    BOTTLES DRAWN AEROBIC AND ANAEROBIC Blood Culture adequate volume   Culture   Final    NO GROWTH 5  DAYS Performed at [facility], [address] [place],  [address]    Report Status 11/23/2020 FINAL  Final   Studies/Results: No results found. Medications:  Scheduled Meds: . sodium chloride   Intravenous Once  . amLODipine  10 mg Oral Daily  . atorvastatin  80 mg Oral Daily  . carvedilol  25 mg Oral BID  . cyanocobalamin  1,000 mcg Intramuscular Once  . epoetin (EPOGEN/PROCRIT) injection  4,000 Units Intravenous Q M,W,F-HD  . ferric citrate  420 mg Oral BID  . furosemide  80 mg Oral BID  . gabapentin  300 mg Oral QHS  . hydrALAZINE  25 mg Oral Q8H  . insulin aspart  0-5 Units Subcutaneous QHS  . insulin aspart  0-9 Units Subcutaneous TID WC  . levETIRAcetam  500 mg Oral Daily  . levETIRAcetam  500 mg Oral Q M,W,F-1800  . lisinopril  20 mg Oral Daily  . pantoprazole  40 mg Intravenous Q12H  . sevelamer carbonate  800 mg Oral TID WC   Continuous Infusions: . sodium chloride 10 mL/hr at 11/22/20 0559   PRN Meds:.sodium chloride, acetaminophen, hydrALAZINE, LORazepam, ondansetron   Assessment: Principal Problem:   Melena Active Problems:   Hypercholesteremia   Hypertension   Crohn's disease (HCC)   Type 2 diabetes mellitus with kidney complication, with long-term current use of insulin (HCC)   End stage renal failure on dialysis ([place])   History of CVA (cerebrovascular accident)   Seizure disorder ([place])   Acute metabolic encephalopathy   Anemia in ESRD (end-stage renal disease) (HCC)   Chronic systolic CHF (congestive heart failure) (HCC)   Polyp of transverse colon    Plan: Patient's hemoglobin dropped to 6.6 last night and he has been reporting bright red blood per rectum intermittently since his recent colonoscopy  Given patient's episode of bright red blood per rectum this morning, I have recommended repeat and Dr. Kurtis Bushman has communicated this with the nursing staff to repeat hemoglobin after his blood transfusion  I have changed his diet to  clear liquid diet and if symptoms continue or further drop in hemoglobin occurs, we may need to consider colonoscopy to evaluate for post polypectomy bleed  Continue PPI twice daily at this time as well  PPI IV twice daily  Continue serial CBCs and transfuse PRN Avoid NSAIDs Maintain 2 large-bore IV lines Please page GI with any acute hemodynamic changes, or signs of active GI bleeding  Dr. Vicente Males to follow from tomorrow    LOS: 5 days   Vonda Antigua, MD 11/23/2020, 11:53 AM

## 2020-11-24 DIAGNOSIS — K921 Melena: Secondary | ICD-10-CM | POA: Diagnosis not present

## 2020-11-24 DIAGNOSIS — K922 Gastrointestinal hemorrhage, unspecified: Secondary | ICD-10-CM | POA: Diagnosis not present

## 2020-11-24 LAB — CBC
HCT: 28.6 % — ABNORMAL LOW (ref 39.0–52.0)
Hemoglobin: 9.1 g/dL — ABNORMAL LOW (ref 13.0–17.0)
MCH: 26.8 pg (ref 26.0–34.0)
MCHC: 31.8 g/dL (ref 30.0–36.0)
MCV: 84.4 fL (ref 80.0–100.0)
Platelets: 125 10*3/uL — ABNORMAL LOW (ref 150–400)
RBC: 3.39 MIL/uL — ABNORMAL LOW (ref 4.22–5.81)
RDW: 17.5 % — ABNORMAL HIGH (ref 11.5–15.5)
WBC: 8.6 10*3/uL (ref 4.0–10.5)
nRBC: 0 % (ref 0.0–0.2)

## 2020-11-24 LAB — BPAM RBC
Blood Product Expiration Date: 202203022359
Blood Product Expiration Date: 202203032359
ISSUE DATE / TIME: 202202052312
ISSUE DATE / TIME: 202202061254
Unit Type and Rh: 7300
Unit Type and Rh: 7300

## 2020-11-24 LAB — TYPE AND SCREEN
ABO/RH(D): B POS
Antibody Screen: NEGATIVE
Unit division: 0
Unit division: 0

## 2020-11-24 LAB — GLUCOSE, CAPILLARY
Glucose-Capillary: 116 mg/dL — ABNORMAL HIGH (ref 70–99)
Glucose-Capillary: 124 mg/dL — ABNORMAL HIGH (ref 70–99)
Glucose-Capillary: 112 mg/dL — ABNORMAL HIGH (ref 70–99)

## 2020-11-24 LAB — SURGICAL PATHOLOGY

## 2020-11-24 MED ORDER — HYDRALAZINE HCL 50 MG PO TABS: 50.0000 mg | ORAL_TABLET | Freq: Three times a day (TID) | ORAL | 0 refills | Status: DC

## 2020-11-24 MED ORDER — PANTOPRAZOLE SODIUM 40 MG PO TBEC: 40.0000 mg | DELAYED_RELEASE_TABLET | Freq: Two times a day (BID) | ORAL | 0 refills | Status: DC

## 2020-11-24 NOTE — Discharge Summary (Addendum)
[name] YQM:[id number] DOB: 17-Apr-1961 DOA: 11/18/2020  PCP: [name], MD  Admit date: 11/18/2020 Discharge date: 11/25/20 Admitted From: home Disposition:  Home   Recommendations for Outpatient Follow-up:  1. Follow up with PCP in 1 week 2. Please obtain BMP/CBC in one week 3. Neurology Dr. Manuella Ghazi in one week 4. GI [name] in 3 days   Home Health:yes    Discharge Condition:Stable CODE STATUS:full  Diet recommendation:renal carb control   Brief/Interim Summary: [name] is a 60 y.o. male with medical history significant Crohn's Dz, ESRD-HD (MWF), HTN, HLD, DM, stroke, seizure, sCHF, anemia, ICH, PVD, DVT not anticoagulated, bilateral BKA, presents with AMS and rectal pain with bleeding and melena.He has temperature 101 in ED.He had ct head negative. Had CT abd/pelvis and GI consulted. CT of abdomen/pelvis: 1. Marked circumferential thickening of the distal rectum and anus with suspected open fistula/dehiscence posteriorly. Adjacent skin and soft tissue thickening in the surrounding soft tissues likely reflecting chronic decubitus ulcer. Correlate with physical exam. 2. Prominent rectal stool ball. 3. Thickened appearance of the urinary bladder wall, which may be accentuated by underdistention. Correlate with urinalysis to exclude cystitis. 4. Cholelithiasis without evidence of acute cholecystitis. 5. Marked extensive atherosclerosis. 6. Right sided pleural thickening and pleural calcification. 7. No acute osseous findings or evidence of osteomyelitis. Findings suggestive of renal osteodystrophy   Melena:Patient has some melena, denies nausea, vomiting, diarrhea. He has rectal pain, which may be due to history of hidradenitis per his wife. Patient has fever of 101 in ED. Did not meet criteria for sepsis. Since patient has history of Crohn's disease,was not sureifpatient has Crohn's disease flareup.  GI was then consulted.  Patient underwent EGD and was  found with  duodenitis and small hiatal hernia done on 2/3  Patient underwent colonoscopy on 11/21/2020.  Was found with 9 mm polyp was found in the ileocecal valve.  Polyp was sessile.  Was resected.  Has several other sessile polyps found in ascending colon, transverse colon and descending and sigmoid colon.  There was mild localized erythematous mucosa in the rectum which was biopsied.  Please see full report.  Pathology pending. He did have post polypectomy bleed.  He is status post total of 2 unit packed red blood cell transfusion post polypectomy.  He has had no further blood in the stool Hemoglobin remained stable today at 9.1.  GI has cleared him to be discharged home with follow-up with [name] and having his CBC rechecked in few days. His IV PPI twice daily was switched to p.o. twice daily. Avoid NSAIDs.   2/8-addendum: patient was suppose to leave after HD, but since he finished late, wife could not pick him up as she had no help. No issues this am. He is doing well, without complaint.  Neurostatus stable. Will dc home today. Discussed with wife.  Crohn's disease: patient on weekly [name] injections  Status post C-scope as mentioned above findings      Hypercholesteremia Continue statin   End stage renal failure on dialysis (MWF): Nephrology following.   He will have dialysis today and his next dialysis will be Wednesday as outpatient Epogen during dialysis   Hypertension Continue amlodipine, Coreg, lisinopril, and Lasix Increase hydralazine to 50 mg 3 times daily Further management as outpatient by his PCP or nephrology      Type 2 diabetes mellitus with kidney complication, with long-term current use of insulin ([place]): Recent A1c 6.7, well controlled.  Follow outpatient meds  History of CVA (cerebrovascular accident) Continue Lipitor   Seizure Continue Keppra.   Acute metabolic encephalopathy: Etiology is not clear. CT  head negative. No focal neuro deficit on physical examination. Possibly due to ongoing infection developing ms change. Neurology consulted-has known neurocognitive ds (likely vascular dementia).  -mri negative for acute abnormality Normal B12, TSH and ammonia Pt refused EEG.  Per neurology pt should f/u with neurology as outpatient.   Anemia in ESRD (end-stage renal disease) (HCC) -Continue iron supplement Continue Epogen during HD  Chronic systolic CHF (congestive heart failure) ([place]): 2D echo on 09/13/2020 showed EF of 40-50% with grade 2 diastolic dysfunction. Compensated. -Volume management by dialysis per renal      Discharge Diagnoses:  Principal Problem:   Melena Active Problems:   Hypercholesteremia   Hypertension   Crohn's disease ([place])   Type 2 diabetes mellitus with kidney complication, with long-term current use of insulin (HCC)   End stage renal failure on dialysis [facility])   History of CVA (cerebrovascular accident)   Seizure disorder ([place])   Acute metabolic encephalopathy   Anemia in ESRD (end-stage renal disease) ([place])   Chronic systolic CHF (congestive heart failure) (HCC)   Polyp of transverse colon    Discharge Instructions   Allergies as of 11/24/2020      Reactions   Methotrexate Other (See Comments)   Blood count drops   Vancomycin Shortness Of Breath   Eyes watering, SOB, wheezing   Cefepime Other (See Comments)   Tape       Medication List    TAKE these medications   acetaminophen 500 MG tablet Commonly known as: TYLENOL Take 1,000 mg by mouth daily as needed for moderate pain or headache.   Alcohol Swabs Pads Use as directed to check blood sugar three times daily for insulin dependent type 2 diabetes.   amLODipine 10 MG tablet Commonly known as: NORVASC TAKE 1 TABLET(10 MG) BY MOUTH DAILY AS NEEDED What changed: See the new instructions.   atorvastatin 80 MG tablet Commonly known as: LIPITOR TAKE 1 TABLET(80 MG) BY MOUTH  DAILY What changed: See the new instructions.   Auryxia 1 GM 210 MG(Fe) tablet Generic drug: ferric citrate Take 420 mg by mouth in the morning and at bedtime.   carvedilol 25 MG tablet Commonly known as: COREG Take 1 tablet (25 mg total) by mouth 2 (two) times daily.   cyanocobalamin 1000 MCG/ML injection Commonly known as: (VITAMIN B-12) Inject 8ml once a week for 4 weeks and then once a month for 3 months   furosemide 80 MG tablet Commonly known as: LASIX TAKE 1 TABLET(80 MG) BY MOUTH TWICE DAILY What changed: See the new instructions.   gabapentin 300 MG capsule Commonly known as: NEURONTIN Take 1 capsule (300 mg total) by mouth at bedtime.   gabapentin 100 MG capsule Commonly known as: NEURONTIN Take 100 mg by mouth in the morning, at noon, in the evening, and at bedtime.   [name] Pen 40 MG/0.4ML Pnkt Generic drug: Adalimumab Inject 40 mg into the muscle once a week.   hydrALAZINE 50 MG tablet Commonly known as: APRESOLINE Take 1 tablet (50 mg total) by mouth every 8 (eight) hours.   insulin aspart 100 UNIT/ML FlexPen Commonly known as: NOVOLOG Inject 3 Units into the skin 3 (three) times daily with meals.   insulin glargine 100 UNIT/ML injection Commonly known as: LANTUS Inject 10 Units into the skin at bedtime.   levETIRAcetam 500 MG tablet Commonly known as: KEPPRA Take  1 tablet (500 mg total) by mouth daily AND 1 tablet (500 mg total) every Monday, Wednesday, and Friday. After dialysis.   lisinopril 20 MG tablet Commonly known as: ZESTRIL Take 20 mg by mouth daily.   ONE TOUCH ULTRA 2 w/Device Kit Use as directed to check blood sugar three times daily. E11.9   pantoprazole 40 MG tablet Commonly known as: Protonix Take 1 tablet (40 mg total) by mouth 2 (two) times daily.   sevelamer carbonate 800 MG tablet Commonly known as: RENVELA TAKE 1 TABLET(800 MG) BY MOUTH THREE TIMES DAILY       Follow-up Information    Lin Landsman, MD Follow  up in 3 day(s).   Specialty: Gastroenterology Why: needs cbc Contact information: [place] [address] [phone]        [name], MD Follow up in 1 week(s).   Specialty: Family Medicine Contact information: [address] [place] [address] [phone]        Vladimir Crofts, MD Follow up in 1 week(s).   Specialty: Neurology Contact information: [facility] [place] [address] [phone]              Allergies  Allergen Reactions  . Methotrexate Other (See Comments)    Blood count drops  . Vancomycin Shortness Of Breath    Eyes watering, SOB, wheezing  . Cefepime Other (See Comments)  . Tape     Consultations:  Neurology   GI   Procedures/Studies: CT ABDOMEN PELVIS WO CONTRAST  Result Date: 11/18/2020 CLINICAL DATA:  Weakness.  Clinical suspicion for diverticulitis. EXAM: CT ABDOMEN AND PELVIS WITHOUT CONTRAST TECHNIQUE: Multidetector CT imaging of the abdomen and pelvis was performed following the standard protocol without IV contrast. COMPARISON:  CT pelvis 12/06/2017, CT chest 05/19/2017 FINDINGS: Lower chest: Right sided pleural thickening and pleural calcification. Mild right basilar subsegmental atelectasis. Mild cardiomegaly. Coronary artery calcification. Hypoattenuation of the cardiac blood pool indicative of anemia. Hepatobiliary: No focal liver abnormality. Small stones layer within the gallbladder lumen. No pericholecystic inflammatory changes by CT. Pancreas: Unremarkable. No pancreatic ductal dilatation or surrounding inflammatory changes. Spleen: Normal in size without focal abnormality. Adrenals/Urinary Tract: Diffuse bilateral adrenal gland thickening, similar to prior CT. Kidneys are normal in size. No hydronephrosis. Thickened appearance of the urinary bladder wall, which may be accentuated by underdistention. Stomach/Bowel: Stomach within normal limits.  No dilated loops of small bowel to suggest obstruction. Normal appendix present in the right lower quadrant (series 2, image 61). Marked circumferential thickening of the distal rectum and anus with suspected open fistula/dehiscence posteriorly (series 2, images 93-94). Adjacent skin and soft tissue thickening in the surrounding soft tissues likely reflecting chronic decubitus ulcer. Prominent rectal stool ball. Remainder of the colon is within normal limits. Vascular/Lymphatic: Marked extensive atherosclerotic calcification including extensive small vessel arterial calcification. No lymphadenopathy evident. Reproductive: Prostate gland within normal limits. Other: No ascites. No abdominopelvic fluid collection. Tiny fat containing umbilical hernia. No pneumoperitoneum. Musculoskeletal: Mild diffusely sclerotic appearance of the osseous structures likely reflecting sequela of renal osteodystrophy. No bony erosions or destructive changes. Multiple scattered Schmorl's nodes within the lumbar spine. Stable sclerotic bone island at the posterior left iliac bone. IMPRESSION: 1. Marked circumferential thickening of the distal rectum and anus with suspected open fistula/dehiscence posteriorly. Adjacent skin and soft tissue thickening in the surrounding soft tissues likely reflecting chronic decubitus ulcer. Correlate with physical exam. 2. Prominent rectal stool ball. 3. Thickened  appearance of the urinary bladder wall, which may be accentuated by underdistention. Correlate with urinalysis to exclude cystitis. 4. Cholelithiasis without evidence of acute cholecystitis. 5. Marked extensive atherosclerosis. 6. Right sided pleural thickening and pleural calcification. 7. No acute osseous findings or evidence of osteomyelitis. Findings suggestive of renal osteodystrophy. Electronically Signed   By: Davina Poke D.O.   On: 11/18/2020 13:11   DG Chest 1 View  Result Date: 11/18/2020 CLINICAL DATA:  Altered mental status.  EXAM: CHEST  1 VIEW COMPARISON:  Chest x-ray dated September 12, 2020. FINDINGS: The heart size and mediastinal contours are within normal limits. Atherosclerotic calcification of the aortic arch. Normal pulmonary vascularity. Chronic mild interstitial coarsening at the lung bases. No focal consolidation, pleural effusion, or pneumothorax. No acute osseous abnormality. Unchanged left axillary stent. IMPRESSION: 1. No active disease. Electronically Signed   By: Titus Dubin M.D.   On: 11/18/2020 12:13   CT Head Wo Contrast  Result Date: 11/18/2020 CLINICAL DATA:  Mental status change. EXAM: CT HEAD WITHOUT CONTRAST TECHNIQUE: Contiguous axial images were obtained from the base of the skull through the vertex without intravenous contrast. COMPARISON:  CT head 09/13/2020 FINDINGS: Brain: No change in chronic left MCA infarct in the frontal lobe and insula. Dystrophic calcification in the infarct. Patchy white matter hypodensity unchanged. Generalized atrophy.  Negative for acute infarct, hemorrhage, mass. Vascular: Negative for hyperdense vessel Skull: Negative Sinuses/Orbits: Negative Other: Motion degraded study IMPRESSION: No acute abnormality Chronic right MCA infarct. Atrophy and chronic microvascular ischemic change in the white matter. Electronically Signed   By: Franchot Gallo M.D.   On: 11/18/2020 12:56   MR BRAIN WO CONTRAST  Result Date: 11/21/2020 CLINICAL DATA:  Lethargy and confusion. EXAM: MRI HEAD WITHOUT CONTRAST TECHNIQUE: Multiplanar, multiecho pulse sequences of the brain and surrounding structures were obtained without intravenous contrast. COMPARISON:  09/12/2020 FINDINGS: Brain: No acute infarct, mass effect or extra-axial collection. Chronic blood products in the anterior right MCA territory. Hyperintense T2-weighted signal is moderately widespread throughout the white matter. Generalized volume loss without a clear lobar predilection. Old anterior right MCA territory infarct. Anterior  left temporal lobe encephalomalacia. The midline structures are normal. Vascular: Major flow voids are preserved. Skull and upper cervical spine: Normal calvarium and skull base. Visualized upper cervical spine and soft tissues are normal. Sinuses/Orbits:No paranasal sinus fluid levels or advanced mucosal thickening. No mastoid or middle ear effusion. Normal orbits. IMPRESSION: 1. No acute intracranial abnormality. 2. Old right MCA territory infarct and findings of chronic small vessel disease. Electronically Signed   By: Ulyses Jarred M.D.   On: 11/21/2020 02:52       Subjective: No sob, cp, dizziness. Ambulating. No blood in stool   Discharge Exam: Vitals:   11/24/20 0757 11/24/20 1302  BP: (!) 182/79 (!) 168/75  Pulse: 65 66  Resp: 20 20  Temp: 98.2 F (36.8 C) 98.5 F (36.9 C)  SpO2: 100% 100%   Vitals:   11/23/20 2336 11/24/20 0416 11/24/20 0757 11/24/20 1302  BP: (!) 169/73 (!) 169/81 (!) 182/79 (!) 168/75  Pulse: 65 66 65 66  Resp: _0 Temp: 98.5 F (36.9 C) 98.4 F (36.9 C) 98.2 F (36.8 C) 98.5 F (36.9 C)  TempSrc: Oral  Oral   SpO2: 100% 100% 100% 100%  Weight:      Height:        General: Pt is alert, awake, not in acute distress Cardiovascular: RRR, S1/S2 +, no rubs, no  gallops Respiratory: CTA bilaterally, no wheezing, no rhonchi Abdominal: Soft, NT, ND, bowel sounds + Extremities: no edema, no cyanosis    The results of significant diagnostics from this hospitalization (including imaging, microbiology, ancillary and laboratory) are listed below for reference.     Microbiology: Recent Results (from the past 240 hour(s))  Blood culture (routine x 2)     Status: None   Collection Time: 11/18/20  1:01 PM   Specimen: BLOOD  Result Value Ref Range Status   Specimen Description BLOOD RIGHT ANTECUBITAL  Final   Special Requests   Final    BOTTLES DRAWN AEROBIC AND ANAEROBIC Blood Culture results may not be optimal due to an excessive volume of  blood received in culture bottles   Culture   Final    NO GROWTH 5 DAYS Performed at [facility], [address], [place], [place] [address]    Report Status 11/23/2020 FINAL  Final  SARS Coronavirus 2 by RT PCR (hospital order, performed in [facility] hospital lab) Nasopharyngeal Nasopharyngeal Swab     Status: None   Collection Time: 11/18/20  1:01 PM   Specimen: Nasopharyngeal Swab  Result Value Ref Range Status   SARS Coronavirus 2 NEGATIVE NEGATIVE Final    Comment: (NOTE) SARS-CoV-2 target nucleic acids are NOT DETECTED.  The SARS-CoV-2 RNA is generally detectable in upper and lower respiratory specimens during the acute phase of infection. The lowest concentration of SARS-CoV-2 viral copies this assay can detect is 250 copies / mL. A negative result does not preclude SARS-CoV-2 infection and should not be used as the sole basis for treatment or other patient management decisions.  A negative result may occur with improper specimen collection / handling, submission of specimen other than nasopharyngeal swab, presence of viral mutation(s) within the areas targeted by this assay, and inadequate number of viral copies (<250 copies / mL). A negative result must be combined with clinical observations, patient history, and epidemiological information.  Fact Sheet for Patients:   StrictlyIdeas.no  Fact Sheet for Healthcare Providers: BankingDealers.co.za  This test is not yet approved or  cleared by the [place] FDA and has been authorized for detection and/or diagnosis of SARS-CoV-2 by FDA under an Emergency Use Authorization (EUA).  This EUA will remain in effect (meaning this test can be used) for the duration of the COVID-19 declaration under Section 564(b)(1) of the Act, 21 U.S.C. section 360bbb-3(b)(1), unless the authorization is terminated or revoked sooner.  Performed at [facility], [address] [place], [place] [address]   Blood culture (routine x 2)     Status: None   Collection Time: 11/18/20  4:42 PM   Specimen: BLOOD  Result Value Ref Range Status   Specimen Description BLOOD LEFT ANTECUBITAL  Final   Special Requests   Final    BOTTLES DRAWN AEROBIC AND ANAEROBIC Blood Culture adequate volume   Culture   Final    NO GROWTH 5 DAYS Performed at [facility], [address], [place], [place] [address]    Report Status 11/23/2020 FINAL  Final     Labs: BNP (last 3 results) No results for input(s): BNP in the last 8760 hours. Basic Metabolic Panel: Recent Labs  Lab 11/18/20 1101 11/19/20 0321 11/20/20 0416 11/21/20 1658  NA 138 138 136  --   K 3.6 3.6 3.8  --   CL 95* 97* 97*  --   CO2 _0 --   GLUCOSE 161* 105*  71  --   BUN 30* 40* 25*  --   CREATININE 5.67* 6.99* 4.55*  --   CALCIUM 9.3 8.8* 8.6*  --   PHOS  --   --   --  3.7   Liver Function Tests: No results for input(s): AST, ALT, ALKPHOS, BILITOT, PROT, ALBUMIN in the last 168 hours. No results for input(s): LIPASE, AMYLASE in the last 168 hours. Recent Labs  Lab 11/21/20 1658  AMMONIA 9   CBC: Recent Labs  Lab 11/20/20 0416 11/20/20 0932 11/22/20 0832 11/22/20 2029 11/23/20 0546 11/23/20 1704 11/24/20 0848  WBC 7.7 7.4 5.9  --  6.5  --  8.6  HGB 8.6* 8.9* 7.5* 6.6* 7.3* 8.9* 9.1*  HCT 27.2* 28.8* 25.0* 21.4* 23.8*  --  28.6*  MCV 84.5 84.7 85.3  --  84.4  --  84.4  PLT 108* 120* 118*  --  109*  --  125*   Cardiac Enzymes: No results for input(s): CKTOTAL, CKMB, CKMBINDEX, TROPONINI in the last 168 hours. BNP: Invalid input(s): POCBNP CBG: Recent Labs  Lab 11/23/20 1337 11/23/20 1639 11/23/20 2212 11/24/20 0740 11/24/20 0747  GLUCAP 201* 159* 146* 124* 116*   D-Dimer No results for input(s): DDIMER in the last 72 hours. Hgb A1c No results for input(s): HGBA1C in the last 72 hours. Lipid Profile No results for input(s): CHOL, HDL, LDLCALC, TRIG,  CHOLHDL, LDLDIRECT in the last 72 hours. Thyroid function studies Recent Labs    11/21/20 1658  TSH 3.860   Anemia work up Recent Labs    11/21/20 1658  VITAMINB12 1,099*   Urinalysis    Component Value Date/Time   COLORURINE YELLOW (A) 12/04/2017 0240   APPEARANCEUR HAZY (A) 12/04/2017 0240   LABSPEC 1.011 12/04/2017 0240   PHURINE 6.0 12/04/2017 0240   GLUCOSEU >=500 (A) 12/04/2017 0240   HGBUR NEGATIVE 12/04/2017 0240   BILIRUBINUR NEGATIVE 12/04/2017 0240   KETONESUR NEGATIVE 12/04/2017 0240   PROTEINUR 100 (A) 12/04/2017 0240   NITRITE NEGATIVE 12/04/2017 0240   LEUKOCYTESUR NEGATIVE 12/04/2017 0240   Sepsis Labs Invalid input(s): PROCALCITONIN,  WBC,  LACTICIDVEN Microbiology Recent Results (from the past 240 hour(s))  Blood culture (routine x 2)     Status: None   Collection Time: 11/18/20  1:01 PM   Specimen: BLOOD  Result Value Ref Range Status   Specimen Description BLOOD RIGHT ANTECUBITAL  Final   Special Requests   Final    BOTTLES DRAWN AEROBIC AND ANAEROBIC Blood Culture results may not be optimal due to an excessive volume of blood received in culture bottles   Culture   Final    NO GROWTH 5 DAYS Performed at [facility], [address] [place], [place] [address]    Report Status 11/23/2020 FINAL  Final  SARS Coronavirus 2 by RT PCR (hospital order, performed in [facility] hospital lab) Nasopharyngeal Nasopharyngeal Swab     Status: None   Collection Time: 11/18/20  1:01 PM   Specimen: Nasopharyngeal Swab  Result Value Ref Range Status   SARS Coronavirus 2 NEGATIVE NEGATIVE Final    Comment: (NOTE) SARS-CoV-2 target nucleic acids are NOT DETECTED.  The SARS-CoV-2 RNA is generally detectable in upper and lower respiratory specimens during the acute phase of infection. The lowest concentration of SARS-CoV-2 viral copies this assay can detect is 250 copies / mL. A negative result does not preclude SARS-CoV-2 infection and should not be  used as the sole basis for treatment or other patient management  decisions.  A negative result may occur with improper specimen collection / handling, submission of specimen other than nasopharyngeal swab, presence of viral mutation(s) within the areas targeted by this assay, and inadequate number of viral copies (<250 copies / mL). A negative result must be combined with clinical observations, patient history, and epidemiological information.  Fact Sheet for Patients:   StrictlyIdeas.no  Fact Sheet for Healthcare Providers: BankingDealers.co.za  This test is not yet approved or  cleared by the [place] FDA and has been authorized for detection and/or diagnosis of SARS-CoV-2 by FDA under an Emergency Use Authorization (EUA).  This EUA will remain in effect (meaning this test can be used) for the duration of the COVID-19 declaration under Section 564(b)(1) of the Act, 21 U.S.C. section 360bbb-3(b)(1), unless the authorization is terminated or revoked sooner.  Performed at [facility], [address] [place], [place] [address]   Blood culture (routine x 2)     Status: None   Collection Time: 11/18/20  4:42 PM   Specimen: BLOOD  Result Value Ref Range Status   Specimen Description BLOOD LEFT ANTECUBITAL  Final   Special Requests   Final    BOTTLES DRAWN AEROBIC AND ANAEROBIC Blood Culture adequate volume   Culture   Final    NO GROWTH 5 DAYS Performed at [facility], [address], [place], [place] [address]    Report Status 11/23/2020 FINAL  Final     Time coordinating discharge: Over 30 minutes  SIGNED:   Nolberto Hanlon, MD  [facility] 11/24/2020, 1:59 PM Pager   If 7PM-7AM, please contact night-coverage [email or web address] Password [email or web address]

## 2020-11-24 NOTE — Progress Notes (Signed)
[facility], [place] 11/24/20  Subjective:   LOS: 6   Patient resting quietly in bed.  Alert and able to participate in assessment Denies shortness of breath Complains of remaining on clear liquid diet Unsure of any scheduled procedures   Objective:  Vital signs in last 24 hours:  Temp:  [98.2 F (36.8 C)-98.6 F (37 C)] 98.5 F (36.9 C) (02/07 1302) Pulse Rate:  [64-66] 66 (02/07 1302) Resp:  [16-20] 20 (02/07 1302) BP: (168-182)/(65-81) 168/75 (02/07 1302) SpO2:  [100 %] 100 % (02/07 1302)  Weight change:  Filed Weights   11/18/20 1059 11/18/20 1628 11/21/20 1130  Weight: 66.7 kg 60.6 kg 60.6 kg    Intake/Output:    Intake/Output Summary (Last 24 hours) at 11/24/2020 1330 Last data filed at 11/24/2020 1002 Gross per 24 hour  Intake 798.33 ml  Output --  Net 798.33 ml     Physical Exam: General:  Pleasant, appears comfortable in bed  HEENT  normocephalic, atraumatic  Pulm/lungs  respirations symmetrical and unlabored, lungs clear  CVS/Heart  S1-S2 present, no rubs or gallops  Abdomen:  Soft  Extremities:  Bilateral lower extremity amputated, with prosthesis, no edema noted on upper legs or arms  Neurologic:  Able to answer simple questions appropriately  Skin: No masses or lesions  Access: Left arm AV fistula +bruit, + thrill       Basic Metabolic Panel:  Recent Labs  Lab 11/18/20 1101 11/19/20 0321 11/20/20 0416 11/21/20 1658  NA 138 138 136  --   K 3.6 3.6 3.8  --   CL 95* 97* 97*  --   CO2 27 26 25   --   GLUCOSE 161* 105* 71  --   BUN 30* 40* 25*  --   CREATININE 5.67* 6.99* 4.55*  --   CALCIUM 9.3 8.8* 8.6*  --   PHOS  --   --   --  3.7     CBC: Recent Labs  Lab 11/20/20 0416 11/20/20 0932 11/22/20 0832 11/22/20 2029 11/23/20 0546 11/23/20 1704 11/24/20 0848  WBC 7.7 7.4 5.9  --  6.5  --  8.6  HGB 8.6* 8.9* 7.5* 6.6* 7.3* 8.9* 9.1*  HCT 27.2* 28.8* 25.0* 21.4* 23.8*  --  28.6*  MCV 84.5 84.7 85.3  --   84.4  --  84.4  PLT 108* 120* 118*  --  109*  --  125*      Lab Results  Component Value Date   HEPBSAG NON REACTIVE 09/13/2020      Microbiology:  Recent Results (from the past 240 hour(s))  Blood culture (routine x 2)     Status: None   Collection Time: 11/18/20  1:01 PM   Specimen: BLOOD  Result Value Ref Range Status   Specimen Description BLOOD RIGHT ANTECUBITAL  Final   Special Requests   Final    BOTTLES DRAWN AEROBIC AND ANAEROBIC Blood Culture results may not be optimal due to an excessive volume of blood received in culture bottles   Culture   Final    NO GROWTH 5 DAYS Performed at [facility], [address] [place],  [address]    Report Status 11/23/2020 FINAL  Final  SARS Coronavirus 2 by RT PCR (hospital order, performed in [facility] hospital lab) Nasopharyngeal Nasopharyngeal Swab     Status: None   Collection Time: 11/18/20  1:01 PM   Specimen: Nasopharyngeal Swab  Result Value Ref Range Status   SARS Coronavirus  2 NEGATIVE NEGATIVE Final    Comment: (NOTE) SARS-CoV-2 target nucleic acids are NOT DETECTED.  The SARS-CoV-2 RNA is generally detectable in upper and lower respiratory specimens during the acute phase of infection. The lowest concentration of SARS-CoV-2 viral copies this assay can detect is 250 copies / mL. A negative result does not preclude SARS-CoV-2 infection and should not be used as the sole basis for treatment or other patient management decisions.  A negative result may occur with improper specimen collection / handling, submission of specimen other than nasopharyngeal swab, presence of viral mutation(s) within the areas targeted by this assay, and inadequate number of viral copies (<250 copies / mL). A negative result must be combined with clinical observations, patient history, and epidemiological information.  Fact Sheet for Patients:   StrictlyIdeas.no  Fact Sheet for Healthcare  Providers: BankingDealers.co.za  This test is not yet approved or  cleared by the [place] FDA and has been authorized for detection and/or diagnosis of SARS-CoV-2 by FDA under an Emergency Use Authorization (EUA).  This EUA will remain in effect (meaning this test can be used) for the duration of the COVID-19 declaration under Section 564(b)(1) of the Act, 21 U.S.C. section 360bbb-3(b)(1), unless the authorization is terminated or revoked sooner.  Performed at [facility], [address] [place], [place] [address]   Blood culture (routine x 2)     Status: None   Collection Time: 11/18/20  4:42 PM   Specimen: BLOOD  Result Value Ref Range Status   Specimen Description BLOOD LEFT ANTECUBITAL  Final   Special Requests   Final    BOTTLES DRAWN AEROBIC AND ANAEROBIC Blood Culture adequate volume   Culture   Final    NO GROWTH 5 DAYS Performed at [facility], [address] [place], [place] [address]    Report Status 11/23/2020 FINAL  Final    Coagulation Studies: No results for input(s): LABPROT, INR in the last 72 hours.  Urinalysis: No results for input(s): COLORURINE, LABSPEC, PHURINE, GLUCOSEU, HGBUR, BILIRUBINUR, KETONESUR, PROTEINUR, UROBILINOGEN, NITRITE, LEUKOCYTESUR in the last 72 hours.  Invalid input(s): APPERANCEUR    Imaging: No results found.   Medications:   . sodium chloride 10 mL/hr at 11/22/20 0559   . amLODipine  10 mg Oral Daily  . atorvastatin  80 mg Oral Daily  . carvedilol  25 mg Oral BID  . cyanocobalamin  1,000 mcg Intramuscular Once  . epoetin (EPOGEN/PROCRIT) injection  4,000 Units Intravenous Q M,W,F-HD  . ferric citrate  420 mg Oral BID  . furosemide  80 mg Oral BID  . gabapentin  300 mg Oral QHS  . hydrALAZINE  25 mg Oral Q8H  . insulin aspart  0-5 Units Subcutaneous QHS  . insulin aspart  0-9 Units Subcutaneous TID WC  . levETIRAcetam  500 mg Oral Daily  . levETIRAcetam  500 mg Oral Q  M,W,F-1800  . lisinopril  20 mg Oral Daily  . pantoprazole  40 mg Intravenous Q12H  . sevelamer carbonate  800 mg Oral TID WC   sodium chloride, acetaminophen, hydrALAZINE, LORazepam, ondansetron  Assessment/ Plan:  60 y.o. male with ESRD on HD MWF followed by [facility] nephrology, diabetes mellitus type 2, history of DVT,bilateralbelow the knee amputations, hypertension, history of intracerebral hemorrhage, anemia of chronic kidney disease, secondary hyperparathyroidism, seizure disorder  was admitted on 11/18/2020 for  Principal Problem:   Melena Active Problems:   Hypercholesteremia   Hypertension   Crohn's disease ([place])   Type  2 diabetes mellitus with kidney complication, with long-term current use of insulin (HCC)   End stage renal failure on dialysis ([place])   History of CVA (cerebrovascular accident)   Seizure disorder ([place])   Acute metabolic encephalopathy   Anemia in ESRD (end-stage renal disease) ([place])   Chronic systolic CHF (congestive heart failure) (HCC)   Polyp of transverse colon  Melena [K92.1] Bloody diarrhea [R19.7] Fever, unspecified fever cause [R50.9] Altered mental status, unspecified altered mental status type [R41.82] Rectal bleeding [K62.5]   [facility] neph[facility]/[address]/ MWF  #. ESRD Will receive dialysis today. Volume and electrolyte status acceptable today   #. Anemia of CKD  Lab Results  Component Value Date   HGB 9.1 (L) 11/24/2020   Continue Epogen 4000 units Mondays, Wednesdays, and Fridays with HD  #. Secondary hyperparathyroidism of renal origin N 25.81      Component Value Date/Time   PTH 283 (H) 01/08/2019 0516   Lab Results  Component Value Date   PHOS 3.7 11/21/2020   Continue sevelamer 800 mg p.o. with meals   #. Diabetes type 2 with CKD Hemoglobin A1C (%)  Date Value  07/24/2014 8.3 (H)   Hgb A1c MFr Bld (%)  Date Value  09/13/2020 6.7 (H)  Blood glucose readings within acceptable range  #GI bleed Patient denies  active rectal bleeding, but reports stools are still dark in color EGD, colonoscopy, and biopsy was done on Friday by the GI team    LOS: [place] 2/7/20221:30 PM   .   [place], [place]

## 2020-11-24 NOTE — Care Management Important Message (Signed)
Important Message  Patient Details  Name: [name] MRN: [id number] Date of Birth: [birth year]   Medicare Important Message Given:  Yes     [name] 11/24/2020, 10:43 AM

## 2020-11-24 NOTE — Progress Notes (Signed)
[name] , MD [address] [place], [place], [place], [address] [address], [place], [place], [place], [address] Phone: [phone]  Fax: [phone]   Mattson B Villarin is being followed for post polypectomy bleed   Subjective: Denies any further bleeding , says bowel movement this am was brown    Objective: Vital signs in last 24 hours: Vitals:   11/23/20 2330 11/23/20 2336 11/24/20 0416 11/24/20 0757  BP: (!) 169/73 (!) 169/73 (!) 169/81 (!) 182/79  Pulse: 65 65 66 65  Resp: 17 17 16 20   Temp: 98.5 F (36.9 C) 98.5 F (36.9 C) 98.4 F (36.9 C) 98.2 F (36.8 C)  TempSrc: Oral Oral  Oral  SpO2: 100% 100% 100% 100%  Weight:      Height:       Weight change:   Intake/Output Summary (Last 24 hours) at 11/24/2020 1122 Last data filed at 11/24/2020 1002 Gross per 24 hour  Intake 798.33 ml  Output -  Net 798.33 ml     Exam:  Abdomen: soft, nontender, normal bowel sounds   Lab Results: @LABTEST2 @ Micro Results: Recent Results (from the past 240 hour(s))  Blood culture (routine x 2)     Status: None   Collection Time: 11/18/20  1:01 PM   Specimen: BLOOD  Result Value Ref Range Status   Specimen Description BLOOD RIGHT ANTECUBITAL  Final   Special Requests   Final    BOTTLES DRAWN AEROBIC AND ANAEROBIC Blood Culture results may not be optimal due to an excessive volume of blood received in culture bottles   Culture   Final    NO GROWTH 5 DAYS Performed at [facility], [address] [place], [place] [address]    Report Status 11/23/2020 FINAL  Final  SARS Coronavirus 2 by RT PCR (hospital order, performed in [facility] hospital lab) Nasopharyngeal Nasopharyngeal Swab     Status: None   Collection Time: 11/18/20  1:01 PM   Specimen: Nasopharyngeal Swab  Result Value Ref Range Status   SARS Coronavirus 2 NEGATIVE NEGATIVE Final    Comment: (NOTE) SARS-CoV-2 target nucleic acids are NOT DETECTED.  The SARS-CoV-2 RNA is generally  detectable in upper and lower respiratory specimens during the acute phase of infection. The lowest concentration of SARS-CoV-2 viral copies this assay can detect is 250 copies / mL. A negative result does not preclude SARS-CoV-2 infection and should not be used as the sole basis for treatment or other patient management decisions.  A negative result may occur with improper specimen collection / handling, submission of specimen other than nasopharyngeal swab, presence of viral mutation(s) within the areas targeted by this assay, and inadequate number of viral copies (<250 copies / mL). A negative result must be combined with clinical observations, patient history, and epidemiological information.  Fact Sheet for Patients:   StrictlyIdeas.no  Fact Sheet for Healthcare Providers: BankingDealers.co.za  This test is not yet approved or  cleared by the [place] FDA and has been authorized for detection and/or diagnosis of SARS-CoV-2 by FDA under an Emergency Use Authorization (EUA).  This EUA will remain in effect (meaning this test can be used) for the duration of the COVID-19 declaration under Section 564(b)(1) of the Act, 21 U.S.C. section 360bbb-3(b)(1), unless the authorization is terminated or revoked sooner.  Performed at [facility], [address], [place], [place] [address]   Blood culture (routine x 2)     Status: None   Collection Time: 11/18/20  4:42 PM   Specimen: BLOOD  Result Value Ref Range Status   Specimen Description BLOOD LEFT ANTECUBITAL  Final   Special Requests   Final    BOTTLES DRAWN AEROBIC AND ANAEROBIC Blood Culture adequate volume   Culture   Final    NO GROWTH 5 DAYS Performed at [facility], [address] [place], [place] [address]    Report Status 11/23/2020 FINAL  Final   Studies/Results: No results found. Medications: I have reviewed the patient's current  medications. Scheduled Meds: . amLODipine  10 mg Oral Daily  . atorvastatin  80 mg Oral Daily  . carvedilol  25 mg Oral BID  . cyanocobalamin  1,000 mcg Intramuscular Once  . epoetin (EPOGEN/PROCRIT) injection  4,000 Units Intravenous Q M,W,F-HD  . ferric citrate  420 mg Oral BID  . furosemide  80 mg Oral BID  . gabapentin  300 mg Oral QHS  . hydrALAZINE  25 mg Oral Q8H  . insulin aspart  0-5 Units Subcutaneous QHS  . insulin aspart  0-9 Units Subcutaneous TID WC  . levETIRAcetam  500 mg Oral Daily  . levETIRAcetam  500 mg Oral Q M,W,F-1800  . lisinopril  20 mg Oral Daily  . pantoprazole  40 mg Intravenous Q12H  . sevelamer carbonate  800 mg Oral TID WC   Continuous Infusions: . sodium chloride 10 mL/hr at 11/22/20 0559   PRN Meds:.sodium chloride, acetaminophen, hydrALAZINE, LORazepam, ondansetron CBC Latest Ref Rng & Units 11/24/2020 11/23/2020 11/23/2020  WBC 4.0 - 10.5 K/uL 8.6 - 6.5  Hemoglobin 13.0 - 17.0 g/dL 9.1(L) 8.9(L) 7.3(L)  Hematocrit 39.0 - 52.0 % 28.6(L) - 23.8(L)  Platelets 150 - 400 K/uL 125(L) - 109(L)     Assessment: Principal Problem:   Melena Active Problems:   Hypercholesteremia   Hypertension   Crohn's disease (HCC)   Type 2 diabetes mellitus with kidney complication, with long-term current use of insulin (HCC)   End stage renal failure on dialysis ([place])   History of CVA (cerebrovascular accident)   Seizure disorder ([place])   Acute metabolic encephalopathy   Anemia in ESRD (end-stage renal disease) (HCC)   Chronic systolic CHF (congestive heart failure) (HCC)   Polyp of transverse colon   [name] 60 y.o. ,male being followed for post polypectomy bleed. Hb stable . Bowel movement this am was brown per patient .   Plan: 1. Advance diet and if no visible further bleeding - can go home and have Hb checked by PCP in a few days    LOS: 6 days   [name] Bellows, MD 11/24/2020, 11:22 AM

## 2020-11-24 NOTE — Anesthesia Postprocedure Evaluation (Signed)
Anesthesia Post Note  Patient: [name]  Procedure(s) Performed: ESOPHAGOGASTRODUODENOSCOPY (EGD) WITH PROPOFOL (N/A )  Patient location during evaluation: Endoscopy Anesthesia Type: General Level of consciousness: awake and alert Pain management: pain level controlled Vital Signs Assessment: post-procedure vital signs reviewed and stable Respiratory status: spontaneous breathing, nonlabored ventilation and respiratory function stable Cardiovascular status: blood pressure returned to baseline and stable Postop Assessment: no apparent nausea or vomiting Anesthetic complications: no   No complications documented.   Last Vitals:  Vitals:   11/24/20 0757 11/24/20 1302  BP: (!) 182/79 (!) 168/75  Pulse: 65 66  Resp: 20 20  Temp: 36.8 C   SpO2: 100% 100%    Last Pain:  Vitals:   11/24/20 1000  TempSrc:   PainSc: 0-No pain                 Alphonsus Sias

## 2020-11-24 NOTE — Progress Notes (Signed)
Pt has had a discharge order pending after dialysis.When he  came back to his room, patient exhibited some confusion after dialysis. He disagree about the room and  disoriented with time. He keep saying that we are putting him in a different room.  It was  not the same mentation as before he left for dialysis. We called wife and wife is not comfortable taking him home tonight with this confusion.

## 2020-11-24 NOTE — TOC Transition Note (Signed)
Transition of Care [facility]) - CM/SW Discharge Note   Patient Details  Name: [name] MRN: [id number] Date of Birth: 1961/08/20  [facility] CM/SW Contact:  Beverly Sessions, RN Phone Number: 11/24/2020, 2:39 PM   Clinical Narrative:     Patient to discharge home today.  [place] with [facility] notified of discharge No DME needs   Final next level of care: Home w Home Health Services Barriers to Discharge: No Barriers Identified   Patient Goals and CMS Choice        Discharge Placement                       Discharge Plan and Services   Discharge Planning Services: CM Consult                      HH Arranged: PT,OT [facility] Agency: [facility] Date [facility] Agency Contacted: 11/24/20   Representative spoke with at [place]: [place] ([place]) Interventions     Readmission Risk Interventions Readmission Risk Prevention Plan 11/21/2020 11/20/2020  Transportation Screening Complete -  [place]RI or [place] - Complete  Palliative Care Screening - Not Applicable  Medication Review (RN Care Manager) - Complete  Some recent data might be hidden

## 2020-11-25 ENCOUNTER — Ambulatory Visit: Payer: Self-pay | Admitting: Family Medicine

## 2020-11-25 ENCOUNTER — Encounter: Payer: Self-pay | Admitting: Gastroenterology

## 2020-11-25 NOTE — Plan of Care (Signed)
Discharge order received. Patient mental status is at baseline. Vital signs stable . No signs of acute distress. Discharge instructions given to pt. Patient verbalized understanding. No other issues noted at this time.

## 2020-11-25 NOTE — Progress Notes (Signed)
Dr Damita Dunnings was made aware that patient seemed confused after dialysis and agreed to keep patient over night.  She did not feel comfortable taking him home tonight. She said she needs to have someone at home to help him in the house. Patient's wife would like confusion addressed further also.

## 2020-11-26 ENCOUNTER — Telehealth: Payer: Self-pay

## 2020-11-26 NOTE — Telephone Encounter (Signed)
HFU scheduled 12/05/20 @ 3:00 PM.

## 2020-11-26 NOTE — Telephone Encounter (Signed)
Transition Care Management Follow-up Telephone Call  Date of discharge and from where: [facility] on 11/25/20  How have you been since you were released from the hospital? Doing better, but is still weak. Pt states he has not had anymore confusion. Pt states he didn't sleep well last night due to having a lot on this mind and stress. Pt states his appetite is good. Declines pain, fever, SOB, dizziness or n/v/d.  Any questions or concerns? No   Items Reviewed:  Did the pt receive and understand the discharge instructions provided? Yes   Medications obtained and verified? No , declines reviewing over the phone.   Any new allergies since your discharge? No   Dietary orders reviewed? Yes  Do you have support at home? Yes   Other (ie: DME, Home Health, etc): N/A  Functional Questionnaire: (I = Independent and D = Dependent)  Bathing/Dressing- I   Meal Prep- I  Eating- I  Maintaining continence- I, has some urine leakage due to stress/anxiety.  Transferring/Ambulation- D, uses a wheelchair and has two prosthesis.  Managing Meds- D, wife manages medications.    Follow up appointments reviewed:    PCP Hospital f/u appt confirmed? Yes  scheduled to see Dr Caryn Section on 12/05/20 @ 3:00 PM.  [facility] f/u appt confirmed? Yes    Are transportation arrangements needed? No   If their condition worsens, is the pt aware to call  their PCP or go to the ED? Yes  Was the patient provided with contact information for the PCP's office or ED? Yes  Was the pt encouraged to call back with questions or concerns? Yes

## 2020-11-26 NOTE — Telephone Encounter (Addendum)
Can 1:20 virtual appt on Tuesday Feb 15th be moved to 1:00pm, and double book with this hospital follow for 40 minute visit also at 1:00pm.

## 2020-11-26 NOTE — Telephone Encounter (Signed)
Copied from [place] [id number]. Topic: Appointment Scheduling - Scheduling Inquiry for Clinic >> Nov 26, 2020 11:03 AM Tessa Lerner A wrote: Reason for CRM: Patient's wife has made contact requesting to schedule a HFU for patient Patient is only able to travel on Tuesdays and Thursdays Agent was unable to schedule a desired appointment at the time of call  Please contact to advise and schedule >> Nov 26, 2020  1:18 PM Truitt Merle wrote: Please advise where we can schedule this patient.  I do not see any availability at the times that are being asked.

## 2020-11-27 ENCOUNTER — Telehealth: Payer: Self-pay

## 2020-11-27 NOTE — Telephone Encounter (Signed)
Copied from [place] [id number]. Topic: Appointment Scheduling - Scheduling Inquiry for Clinic >> Nov 26, 2020 11:03 AM Tessa Lerner A wrote: Reason for CRM: Patient's wife has made contact requesting to schedule a HFU for patient Patient is only able to travel on Tuesdays and Thursdays Agent was unable to schedule a desired appointment at the time of call  Please contact to advise and schedule >> Nov 26, 2020  1:18 PM Truitt Merle wrote: Please advise where we can schedule this patient.  I do not see any availability at the times that are being asked.

## 2020-12-02 ENCOUNTER — Encounter: Payer: Self-pay | Admitting: Family Medicine

## 2020-12-02 ENCOUNTER — Encounter: Payer: Self-pay | Admitting: *Deleted

## 2020-12-02 ENCOUNTER — Ambulatory Visit: Payer: Medicare Other | Admitting: Gastroenterology

## 2020-12-02 ENCOUNTER — Other Ambulatory Visit: Payer: Self-pay

## 2020-12-02 ENCOUNTER — Ambulatory Visit (INDEPENDENT_AMBULATORY_CARE_PROVIDER_SITE_OTHER): Payer: Medicare Other | Admitting: Family Medicine

## 2020-12-02 VITALS — BP 180/73 | HR 63 | Temp 97.3°F | Ht 71.0 in | Wt 147.0 lb

## 2020-12-02 DIAGNOSIS — Z89511 Acquired absence of right leg below knee: Secondary | ICD-10-CM

## 2020-12-02 DIAGNOSIS — I739 Peripheral vascular disease, unspecified: Secondary | ICD-10-CM | POA: Diagnosis not present

## 2020-12-02 DIAGNOSIS — Z992 Dependence on renal dialysis: Secondary | ICD-10-CM

## 2020-12-02 DIAGNOSIS — I679 Cerebrovascular disease, unspecified: Secondary | ICD-10-CM

## 2020-12-02 DIAGNOSIS — Z89512 Acquired absence of left leg below knee: Secondary | ICD-10-CM

## 2020-12-02 DIAGNOSIS — D631 Anemia in chronic kidney disease: Secondary | ICD-10-CM

## 2020-12-02 DIAGNOSIS — I5022 Chronic systolic (congestive) heart failure: Secondary | ICD-10-CM

## 2020-12-02 DIAGNOSIS — N186 End stage renal disease: Secondary | ICD-10-CM

## 2020-12-02 DIAGNOSIS — R410 Disorientation, unspecified: Secondary | ICD-10-CM

## 2020-12-02 DIAGNOSIS — Z8673 Personal history of transient ischemic attack (TIA), and cerebral infarction without residual deficits: Secondary | ICD-10-CM

## 2020-12-02 NOTE — Patient Instructions (Addendum)
.   Please review the attached list of medications and notify my office if there are any errors.   . Take 118m gabapentin capsule along with the 3063mcapsule every night.

## 2020-12-02 NOTE — Progress Notes (Signed)
Established patient visit   Patient: [name]   DOB: November 07, 1960   60 y.o. Male  MRN: [id number] Visit Date: 12/02/2020  Today's healthcare provider: [name], MD   Chief Complaint  Patient presents with  . Hospitalization Follow-up   Subjective    HPI  Follow up Hospitalization  Patient was admitted to [facility] on 11/18/2020 and discharged on 11/24/2020. He was treated for altered mental status, unspecified altered mental status type, melena, fever with unspecified cause. Transfused 2 units PRBCs. MRI showed sequela of old right MCA territory infarct and findings of chronic small vessel disease. Treatment for this included EKG, hydralazine and protonix. He had EGD showing mild inflammation of duodenum and many adenomatous polyps were removed.  Telephone follow up was done on 11/26/2020. He had GI follow up scheduled this week but has to reschedule due to conflict with dialysis.  He reports excellent compliance with treatment. He reports this condition is improved. However his wife states his mental status is still not back to baseline.   ----------------------------------------------------------------------------------------- -    Medications: Outpatient Medications Prior to Visit  Medication Sig  . acetaminophen (TYLENOL) 500 MG tablet Take 1,000 mg by mouth daily as needed for moderate pain or headache.   . Adalimumab ([name] PEN) 40 MG/0.4ML PNKT Inject 40 mg into the muscle once a week.   . Alcohol Swabs PADS Use as directed to check blood sugar three times daily for insulin dependent type 2 diabetes.  Marland Kitchen amLODipine (NORVASC) 10 MG tablet TAKE 1 TABLET(10 MG) BY MOUTH DAILY AS NEEDED (Patient taking differently: Take 10 mg by mouth daily.)  . atorvastatin (LIPITOR) 80 MG tablet TAKE 1 TABLET(80 MG) BY MOUTH DAILY (Patient taking differently: Take 80 mg by mouth daily.)  . AURYXIA 1 GM 210 MG(Fe) tablet Take 420 mg by mouth in the morning and at bedtime.   . Blood  Glucose Monitoring Suppl (ONE TOUCH ULTRA 2) w/Device KIT Use as directed to check blood sugar three times daily. E11.9  . carvedilol (COREG) 25 MG tablet Take 1 tablet (25 mg total) by mouth 2 (two) times daily.  . cyanocobalamin (,VITAMIN B-12,) 1000 MCG/ML injection Inject 28m once a week for 4 weeks and then once a month for 3 months  . furosemide (LASIX) 80 MG tablet TAKE 1 TABLET(80 MG) BY MOUTH TWICE DAILY (Patient taking differently: Take 80 mg by mouth 2 (two) times daily.)  . gabapentin (NEURONTIN) 300 MG capsule Take 1 capsule (300 mg total) by mouth at bedtime.  . hydrALAZINE (APRESOLINE) 50 MG tablet Take 1 tablet (50 mg total) by mouth every 8 (eight) hours.  . insulin aspart (NOVOLOG) 100 UNIT/ML FlexPen Inject 3 Units into the skin 3 (three) times daily with meals.  . insulin glargine (LANTUS) 100 UNIT/ML injection Inject 10 Units into the skin at bedtime.   . levETIRAcetam (KEPPRA) 500 MG tablet Take 1 tablet (500 mg total) by mouth daily AND 1 tablet (500 mg total) every Monday, Wednesday, and Friday. After dialysis.  .Marland Kitchenlisinopril (ZESTRIL) 20 MG tablet Take 20 mg by mouth daily.   . sevelamer carbonate (RENVELA) 800 MG tablet TAKE 1 TABLET(800 MG) BY MOUTH THREE TIMES DAILY  . [DISCONTINUED] gabapentin (NEURONTIN) 100 MG capsule Take 100 mg by mouth in the morning, at noon, in the evening, and at bedtime.  . [DISCONTINUED] pantoprazole (PROTONIX) 40 MG tablet Take 1 tablet (40 mg total) by mouth 2 (two) times daily.   No facility-administered medications  prior to visit.    Review of Systems     Objective    BP (!) 180/73 (BP Location: Right Arm, Patient Position: Sitting, Cuff Size: Large)   Pulse 63   Temp (!) 97.3 F (36.3 C) (Temporal)   Ht 5' 11"  (1.803 m)   Wt 147 lb (66.7 kg)   BMI 20.50 kg/m     Physical Exam   General: Appearance:    Well developed, well nourished male in no acute distress  Eyes:    PERRL, conjunctiva/corneas clear, EOM's intact        Lungs:     Clear to auscultation bilaterally, respirations unlabored  Heart:    Normal heart rate. Normal rhythm. No murmurs, rubs, or gallops.   MS:    Below knee amputation of right lower extremity is noted.    Neurologic:   Awake, alert, oriented x 3. No apparent focal neurological           defect.         Assessment & Plan     1. Anemia in ESRD (end-stage renal disease) ([place])  - AMB Referral to [facility] - CBC  2. End stage renal failure on dialysis ([place])  - AMB Referral to [place]  3. Chronic systolic CHF (congestive heart failure) (HCC)  - AMB Referral to [facility]  4. Confusion  - Ammonia  5. Peripheral vascular disease ([place])  - AMB Referral to [facility]  6. S/P bilateral BKA (below knee amputation) (HCC)  - AMB Referral to [place]  7. Cerebrovascular disease   8. History of CVA (cerebrovascular accident)   9. Phantom limb pain - gabapentin (NEURONTIN) 100 MG capsule; Take 100 mg by mouth at bedtime. Take along with 353m capsule at bedtime   10. Hypertension Now on hydralazine. Consider increase to 100 if home Bps consistently elevated.        The entirety of the information documented in the History of Present Illness, Review of Systems and Physical Exam were personally obtained by me. Portions of this information were initially documented by the CMA and reviewed by me for thoroughness and accuracy.      DLelon Huh MD  [phone](phone) [phone](fax)  CMaeser

## 2020-12-03 ENCOUNTER — Telehealth: Payer: Self-pay | Admitting: *Deleted

## 2020-12-03 ENCOUNTER — Telehealth: Payer: Self-pay

## 2020-12-03 DIAGNOSIS — N186 End stage renal disease: Secondary | ICD-10-CM

## 2020-12-03 DIAGNOSIS — D631 Anemia in chronic kidney disease: Secondary | ICD-10-CM

## 2020-12-03 DIAGNOSIS — R410 Disorientation, unspecified: Secondary | ICD-10-CM

## 2020-12-03 DIAGNOSIS — R413 Other amnesia: Secondary | ICD-10-CM

## 2020-12-03 LAB — CBC
Hematocrit: 24.9 % — ABNORMAL LOW (ref 37.5–51.0)
Hemoglobin: 8 g/dL — ABNORMAL LOW (ref 13.0–17.7)
MCH: 26.4 pg — ABNORMAL LOW (ref 26.6–33.0)
MCHC: 32.1 g/dL (ref 31.5–35.7)
MCV: 82 fL (ref 79–97)
Platelets: 134 10*3/uL — ABNORMAL LOW (ref 150–450)
RBC: 3.03 x10E6/uL — ABNORMAL LOW (ref 4.14–5.80)
RDW: 17.5 % — ABNORMAL HIGH (ref 11.6–15.4)
WBC: 9.4 10*3/uL (ref 3.4–10.8)

## 2020-12-03 LAB — AMMONIA: Ammonia: 44 ug/dL (ref 40–200)

## 2020-12-03 NOTE — Telephone Encounter (Signed)
Made appointment 01/27/2021

## 2020-12-03 NOTE — Telephone Encounter (Signed)
-----   Message from Lin Landsman, MD sent at 12/03/2020  1:05 PM EST ----- Sure,   Caryl Pina, can you please call the patient and schedule his follow-up  RV ----- Message ----- From: [name], MD Sent: 12/03/2020  12:56 PM EST To: Lin Landsman, MD  FYI. Patient missed GI appt due to having dialysis. Is supposed to be calling to reschedule ASAP.

## 2020-12-03 NOTE — Telephone Encounter (Signed)
I called and advised patient's wife [name] of the results below. She agrees to referrals. Hematology referral order placed. She states patient has not been taking any iron supplements. [name] states that patient was told by another doctor to stop taking iron supplements. Carmen doesn't remember which doctor said that or how long ago that was. [name] states that patient had an appointment with GI yesterday but they missed that appointment. The soonest that GI had available for reschedule was in April.   [name] wanted to make sure that Dr. Caryn Section is aware that patient was diagnosed with Dementia while in the hospital. [name] says that this problem is on the top of her list of concers and she would like to have Neurology follow up on this. She is requesting a Neurology referral for Dementia. Please advise.

## 2020-12-03 NOTE — Telephone Encounter (Signed)
-----   Message from [name], MD sent at 12/03/2020 12:54 PM EST ----- Hemoglobin has dropped since discharge from 8.0 to 9.1. some of this is probably due to kidney disease. He should be taking a daily iron sulfate 365m. Needs to follow up with GI ASAP, have sent copy of labs. Also recommend referral to hematology. Ammonia levels are still pending.

## 2020-12-03 NOTE — Chronic Care Management (AMB) (Addendum)
  Chronic Care Management   Note  12/03/2020 Name: JEROMEY KRUER MRN: [id number] DOB: Feb 17, 1961  Toribio Harbour Pinn is a 60 y.o. year old male who is a primary care patient of [name], [name], MD. I reached out to [facility] by phone today in response to a referral sent by Mr. Otho Perl PCP, [name], MD.  Mr. Baskins was given information about Chronic Care Management services today including:  1. CCM service includes personalized support from designated clinical staff supervised by his physician, including individualized plan of care and coordination with other care providers 2. 24/7 contact phone numbers for assistance for urgent and routine care needs. 3. Service will only be billed when office clinical staff spend 20 minutes or more in a month to coordinate care. 4. Only one practitioner may furnish and bill the service in a calendar month. 5. The patient may stop CCM services at any time (effective at the end of the month) by phone call to the office staff. 6. The patient will be responsible for cost sharing (co-pay) of up to 20% of the service fee (after annual deductible is met).  Patient spouse [name] Dibenedetto DPR on file  verbally agreed to assistance and services provided by embedded care coordination/care management team today.  Follow up plan: Telephone appointment with care management team member scheduled for:12/23/2020  Avelyn Touch  Care Guide, Embedded Care Coordination [place]  Care Management  Direct Dial: [phone]

## 2020-12-04 NOTE — Telephone Encounter (Signed)
Neurology referral has been placed.

## 2020-12-05 ENCOUNTER — Other Ambulatory Visit: Payer: Self-pay

## 2020-12-05 ENCOUNTER — Inpatient Hospital Stay: Payer: Medicare Other | Admitting: Family Medicine

## 2020-12-05 ENCOUNTER — Encounter (INDEPENDENT_AMBULATORY_CARE_PROVIDER_SITE_OTHER): Payer: Medicare Other | Admitting: Family Medicine

## 2020-12-05 DIAGNOSIS — I132 Hypertensive heart and chronic kidney disease with heart failure and with stage 5 chronic kidney disease, or end stage renal disease: Secondary | ICD-10-CM | POA: Diagnosis not present

## 2020-12-05 DIAGNOSIS — I5022 Chronic systolic (congestive) heart failure: Secondary | ICD-10-CM

## 2020-12-05 DIAGNOSIS — K8020 Calculus of gallbladder without cholecystitis without obstruction: Secondary | ICD-10-CM

## 2020-12-05 DIAGNOSIS — N186 End stage renal disease: Secondary | ICD-10-CM

## 2020-12-05 DIAGNOSIS — E1140 Type 2 diabetes mellitus with diabetic neuropathy, unspecified: Secondary | ICD-10-CM | POA: Diagnosis not present

## 2020-12-05 DIAGNOSIS — E1122 Type 2 diabetes mellitus with diabetic chronic kidney disease: Secondary | ICD-10-CM

## 2020-12-05 DIAGNOSIS — G40909 Epilepsy, unspecified, not intractable, without status epilepticus: Secondary | ICD-10-CM

## 2020-12-05 DIAGNOSIS — I69354 Hemiplegia and hemiparesis following cerebral infarction affecting left non-dominant side: Secondary | ICD-10-CM | POA: Diagnosis not present

## 2020-12-05 DIAGNOSIS — D631 Anemia in chronic kidney disease: Secondary | ICD-10-CM

## 2020-12-05 DIAGNOSIS — D122 Benign neoplasm of ascending colon: Secondary | ICD-10-CM

## 2020-12-17 ENCOUNTER — Other Ambulatory Visit: Payer: Self-pay | Admitting: Family Medicine

## 2020-12-23 ENCOUNTER — Ambulatory Visit (INDEPENDENT_AMBULATORY_CARE_PROVIDER_SITE_OTHER): Payer: Medicare Other

## 2020-12-23 DIAGNOSIS — E1129 Type 2 diabetes mellitus with other diabetic kidney complication: Secondary | ICD-10-CM | POA: Diagnosis not present

## 2020-12-23 DIAGNOSIS — D631 Anemia in chronic kidney disease: Secondary | ICD-10-CM

## 2020-12-23 DIAGNOSIS — I10 Essential (primary) hypertension: Secondary | ICD-10-CM

## 2020-12-23 DIAGNOSIS — Z992 Dependence on renal dialysis: Secondary | ICD-10-CM | POA: Diagnosis not present

## 2020-12-23 DIAGNOSIS — Z794 Long term (current) use of insulin: Secondary | ICD-10-CM

## 2020-12-23 DIAGNOSIS — N186 End stage renal disease: Secondary | ICD-10-CM

## 2020-12-23 DIAGNOSIS — I5022 Chronic systolic (congestive) heart failure: Secondary | ICD-10-CM

## 2020-12-23 NOTE — Chronic Care Management (AMB) (Signed)
Chronic Care Management   Initial Visit Note  12/23/2020 Name: [name] MRN: [id number] DOB: 1961/07/05  Primary Care Provider: [name], MD Reason for referral : Chronic Care Management   [name] is a 60 y.o. year old male who is a primary care patient of [name], [name], MD. The CCM team was consulted for assistance with chronic disease management and care coordination. A telephonic outreach was conducted today with [name] and his spouse.  Review of [name] status, including review of consultants reports, relevant labs and test results was conducted today. Collaboration with appropriate care team members was performed as part of the comprehensive evaluation and provision of chronic care management services.    SDOH (Social Determinants of Health) assessments performed: Yes See Care Plan activities for detailed interventions related to SDOH  SDOH Interventions   Flowsheet Row Most Recent Value  SDOH Interventions   Food Insecurity Interventions Intervention Not Indicated  Transportation Interventions Intervention Not Indicated       Medications: Outpatient Encounter Medications as of 12/23/2020  Medication Sig Note  . acetaminophen (TYLENOL) 500 MG tablet Take 1,000 mg by mouth daily as needed for moderate pain or headache.    . Adalimumab ([name] PEN) 40 MG/0.4ML PNKT Inject 40 mg into the muscle once a week.    . Alcohol Swabs PADS Use as directed to check blood sugar three times daily for insulin dependent type 2 diabetes.   Marland Kitchen amLODipine (NORVASC) 10 MG tablet TAKE 1 TABLET(10 MG) BY MOUTH DAILY AS NEEDED (Patient taking differently: Take 10 mg by mouth daily.)   . atorvastatin (LIPITOR) 80 MG tablet TAKE 1 TABLET(80 MG) BY MOUTH DAILY (Patient taking differently: Take 80 mg by mouth daily.)   . carvedilol (COREG) 25 MG tablet Take 1 tablet (25 mg total) by mouth 2 (two) times daily.   . cyanocobalamin (,VITAMIN B-12,) 1000 MCG/ML injection Inject  41m once a week for 4 weeks and then once a month for 3 months   . furosemide (LASIX) 80 MG tablet TAKE 1 TABLET(80 MG) BY MOUTH TWICE DAILY (Patient taking differently: Take 80 mg by mouth 2 (two) times daily.)   . gabapentin (NEURONTIN) 100 MG capsule Take 100 mg by mouth at bedtime. Take along with 3018mcapsule at bedtime   . gabapentin (NEURONTIN) 300 MG capsule Take 1 capsule (300 mg total) by mouth at bedtime.   . insulin aspart (NOVOLOG) 100 UNIT/ML FlexPen Inject 3 Units into the skin 3 (three) times daily with meals.   . levETIRAcetam (KEPPRA) 500 MG tablet Take 1 tablet (500 mg total) by mouth daily AND 1 tablet (500 mg total) every Monday, Wednesday, and Friday. After dialysis.   . Marland Kitchenevelamer carbonate (RENVELA) 800 MG tablet TAKE 1 TABLET(800 MG) BY MOUTH THREE TIMES DAILY   . AURYXIA 1 GM 210 MG(Fe) tablet Take 420 mg by mouth in the morning and at bedtime.  12/23/2020: Reports taking 2 tablets three times a day.  . Blood Glucose Monitoring Suppl (ONE TOUCH ULTRA 2) w/Device KIT Use as directed to check blood sugar three times daily. E11.9   . hydrALAZINE (APRESOLINE) 50 MG tablet Take 1 tablet (50 mg total) by mouth every 8 (eight) hours. 12/23/2020: Reports taking twice a day  . insulin glargine (LANTUS) 100 UNIT/ML injection Inject 10 Units into the skin at bedtime.    . Marland Kitchenisinopril (ZESTRIL) 20 MG tablet Take 20 mg by mouth daily.  12/23/2020: Reports dose was increased to 4073m  daily   No facility-administered encounter medications on file as of 12/23/2020.     Objective:  Patient Care Plan: Diabetes Type 2 (Adult)    Problem Identified: Disease Progression (Diabetes, Type 2)     Long-Range Goal: Disease Progression Prevented or Minimized   Start Date: 12/23/2020  Expected End Date: 04/22/2021  Priority: High  Note:   Objective:  Lab Results  Component Value Date   HGBA1C 6.7 (H) 09/13/2020 .   Lab Results  Component Value Date   CREATININE 4.55 (H) 11/20/2020   CREATININE 6.99  (H) 11/19/2020   CREATININE 5.67 (H) 11/18/2020 .   Marland Kitchen No results found for: EGFR    Current Barriers:  . Chronic Disease Management support and educational needs r/t Diabetes self-management.  Case Manager Clinical Goal(s):  Marland Kitchen Over the next 120 days, patient will demonstrate improved adherence to prescribed treatment plan for Diabetes self management as evidenced by daily monitoring and recording of CBG, adherence to prescribed diet and adherence to prescribed medication regimen.   Interventions:  . Collaboration with [name], MD regarding development and update of comprehensive plan of care as evidenced by provider attestation and co-signature . Inter-disciplinary care team collaboration (see longitudinal plan of care) . Reviewed medications. Encouraged to take medications and administer insulin as prescribed. Spouse currently assisting with medication preparation/management. Encouraged to notify team with concerns regarding prescription cost. . Provided information regarding importance of consistent blood glucose monitoring. Reports no longer monitoring d/t not wanting to perform finger sticks. He is currently taking insulin. Also notes a decreased appetite for the last month. Discussed high risk for hypoglycemia d/t not checking levels prior to administering insulin. Encouraged to monitor consistently and maintain a log. . Reviewed s/sx of hypoglycemia and hyperglycemia along with recommended interventions. . Discussed nutritional intake. His diet is restricted d/t diabetes, heart disease and ESRD. He reports tolerating small high protein meals, usually twice a day but has noted a decreased appetite. He has attempted to consume various protein shakes. Discussed need to consider Nepro shakes. He will request a sample during dialysis tomorrow. His spouse will notify the care management team if he requires assistance with obtaining supplements. . Discussed importance of completing  recommended DM preventive care. He is a bilateral below the knee amputee. Will continue monitoring skin integrity daily. He is due for an eye exam. His spouse will call to schedule an exam within the next month.   Patient Goals/Self-Care Activities Over the next 120 days, patient will:  -Administer medications as prescribed (Spouse will continue to assist) -Attend all scheduled provider appointments -Monitor blood glucose levels consistently and utilize recommended interventions -Adhere to prescribed diet restrictions -Notify provider or care management team with questions and new concerns as needed.   Follow Up Plan:  Will follow up within the next 30 days.     Patient Care Plan: Heart Failure (Adult)    Problem Identified: Symptom Exacerbation (Heart Failure)     Long-Range Goal: Symptom Exacerbation Prevented or Minimized   Start Date: 12/23/2020  Expected End Date: 04/22/2021  Priority: High  Note:   Current Barriers:  . Chronic Disease Management support and educational needs r/t CHF.  Case Manager Clinical Goal(s):  Marland Kitchen Over the next 120 days, patient will not require hospitalization or emergent treatment d/t complications r/t CHF exacerbation.  Interventions:  . Collaboration with [name], MD regarding development and update of comprehensive plan of care as evidenced by provider attestation and co-signature . Inter-disciplinary care team  collaboration (see longitudinal plan of care) . Discussed current plan for CHF management. Encouraged to continue taking medication and diuretic as prescribed. Encouraged to continue monitoring sodium intake and comply with the fluid restrictions provided by the Nephrology team. Discussed importance of regularly assessing for symptoms of volume overload. He is a bilateral BKA and unable to weigh in the home. His spouse reports assessing for facial and abdominal edema specifically on days when he does not receive dialysis. Denies current  concerns. . Reviewed s/sx of CHF exacerbation and indications for seeking immediate medical attention.   Patient Goals/Self-Care Activities:  Over the next 120 days, patient will: -Take medications as prescribed (Spouse will continue to assist) -Continue assessing for s/sx of volume overload and notify provider with concerns -Seek immediate attention if experiencing severe/unrelieved symptoms  -Adhere to recommended diet and fluid restrictions    Follow Up Plan:  Will follow up within the next month   Patient Care Plan: Fall Risk (Adult)    Problem Identified: Fall Risk     Long-Range Goal: Absence of Fall and Fall-Related Injury   Start Date: 12/23/2020  Expected End Date: 04/22/2021  Priority: High  Note:   Current Barriers:  . High Risk for Falls r/t bilateral amputations below the knee. . Unable to perform ADLs independently . Unable to perform IADLs independently   Clinical Goal(s):  Marland Kitchen Over the next 120 days, patient will not experience falls or require emergent treatment d/t fall related injuries.  Interventions:  . Collaboration with [name], MD regarding development and update of comprehensive plan of care as evidenced by provider attestation and co-signature . Inter-disciplinary care team collaboration (see longitudinal plan of care) . Reviewed medications and discussed potential side effects such as dizziness and lightheadedness. . Assessed for falls within the last year. . Provided information regarding safety and fall prevention. Currently using a transfer bench as advised. He does note his risk for falls seems greater when transferring out of the bed into his chair. Encouraged to request assistance when needed and avoid attempting to transfer when he feels weak. Reports having a ramp to enter and exit the home. The Home Health Physical Therapy will be evaluating for in home needs such as rails and grab bars.  . Discussed need for assistance in the home. His  spouse currently serves as his primary care giver. He is currently receiving Home Health services with [facility]. His spouse has considered need for long term services. The current plan is to remain in the home. Encouraged to update the care management team if his functional status declines and a higher level of care is required.   Self-Care Deficits/Patient Goals:  -Utilize wheelchair and utilize proper transfer techniques -Keep pathways clear and well lit -Avoid overreaching to prevent accidental wheelchair tilting -Adhere to recommended safety precautions -Contact the provider or the care management team if level of care changes   Follow Up Plan:  Will follow up within the next month    Patient Care Plan: Hypertension and Hyperlipidemia    Problem Identified: Hypertension and Hyperlipidemia     Long-Range Goal: Hypertension and Hyperlipidemia Monitored   Start Date: 12/23/2020  Expected End Date: 04/22/2021  Priority: High  Note:    Objective:  . Last practice recorded BP readings:  BP Readings from Last 3 Encounters:  12/02/20 (!) 180/73  11/25/20 (!) 194/82  10/28/20 (!) 165/61 .   Marland Kitchen Most recent eGFR/CrCl: No results found for: EGFR  No components found for: CRCL  Lab Results  Component Value Date   CHOL 109 09/13/2020   HDL 40 (L) 09/13/2020   LDLCALC 51 09/13/2020   TRIG 90 09/13/2020   CHOLHDL 2.7 09/13/2020      Current Barriers:  . Chronic Disease Management support and educational needs r/t Hypertension and Hyperlipidemia.  Case Manager Clinical Goal(s):  Marland Kitchen Over the next 120 days, patient will demonstrate improved adherence to prescribed treatment plan as evidenced by taking all medications as prescribed, monitoring, and recording blood pressure and adhering recommended cardiac/renal/diabetic diet.   Interventions:  . Collaboration with [name], MD regarding development and update of comprehensive plan of care as evidenced by provider attestation and  co-signature . Inter-disciplinary care team collaboration (see longitudinal plan of care) . Reviewed medications. Encouraged to continue taking as prescribed and notify provider if unable to tolerate prescribed regimen.  . Provided information regarding established blood pressure parameters. Reports currently not monitoring in the home. Notes recent readings have been significantly elevated.  Strongly encouraged to monitor and record readings. Discussed indications for notifying a provider. . Discussed compliance with recommended diet. His diet is very restricted d/t renal failure, heart disease, and diabetes. Encouraged to continue eating small high protein meals as advised by the Nephrology team. Encouraged to read nutrition labels, monitor sodium intake and avoid highly processed foods when possible. . Discussed complications of uncontrolled blood pressure. Reviewed s/sx of heart attack, stroke and worsening symptoms that require immediate medical attention.    Patient Goals/Self-Care Activities -Administer medications as prescribed (Spouse will continue to assist) -Attend all scheduled provider appointments -Monitor and record blood pressure -Adhere to recommended diet -Notify provider or care management team with questions and new concerns as needed   Follow Up Plan:  Will follow up within the next month        [name] was given information about Chronic Care Management services including:  1. CCM service includes personalized support from designated clinical staff supervised by his physician, including individualized plan of care and coordination with other care providers 2. 24/7 contact phone numbers for assistance for urgent and routine care needs. 3. Service will only be billed when office clinical staff spend 20 minutes or more in a month to coordinate care. 4. Only one practitioner may furnish and bill the service in a calendar month. 5. The patient may stop CCM services  at any time (effective at the end of the month) by phone call to the office staff. 6. The patient will be responsible for cost sharing (co-pay) of up to 20% of the service fee (after annual deductible is met).  Patient agreed to services and verbal consent obtained.     PLAN A member of the care management team will follow up with [name] within the next 30 days.   Cristy [facility] Care Management [facility] [phone]

## 2020-12-25 ENCOUNTER — Other Ambulatory Visit: Payer: Self-pay

## 2020-12-25 ENCOUNTER — Encounter: Payer: Self-pay | Admitting: Oncology

## 2020-12-25 ENCOUNTER — Inpatient Hospital Stay: Payer: Medicare Other

## 2020-12-25 ENCOUNTER — Inpatient Hospital Stay: Payer: Medicare Other | Attending: Oncology | Admitting: Oncology

## 2020-12-25 VITALS — BP 180/74 | HR 65 | Temp 99.1°F | Resp 16

## 2020-12-25 DIAGNOSIS — Z79899 Other long term (current) drug therapy: Secondary | ICD-10-CM | POA: Diagnosis not present

## 2020-12-25 DIAGNOSIS — I130 Hypertensive heart and chronic kidney disease with heart failure and stage 1 through stage 4 chronic kidney disease, or unspecified chronic kidney disease: Secondary | ICD-10-CM | POA: Diagnosis not present

## 2020-12-25 DIAGNOSIS — D631 Anemia in chronic kidney disease: Secondary | ICD-10-CM

## 2020-12-25 DIAGNOSIS — D649 Anemia, unspecified: Secondary | ICD-10-CM

## 2020-12-25 DIAGNOSIS — Z992 Dependence on renal dialysis: Secondary | ICD-10-CM | POA: Diagnosis not present

## 2020-12-25 DIAGNOSIS — E7800 Pure hypercholesterolemia, unspecified: Secondary | ICD-10-CM | POA: Diagnosis not present

## 2020-12-25 DIAGNOSIS — E1122 Type 2 diabetes mellitus with diabetic chronic kidney disease: Secondary | ICD-10-CM | POA: Insufficient documentation

## 2020-12-25 DIAGNOSIS — I509 Heart failure, unspecified: Secondary | ICD-10-CM | POA: Diagnosis not present

## 2020-12-25 DIAGNOSIS — N185 Chronic kidney disease, stage 5: Secondary | ICD-10-CM

## 2020-12-25 DIAGNOSIS — I739 Peripheral vascular disease, unspecified: Secondary | ICD-10-CM | POA: Insufficient documentation

## 2020-12-25 DIAGNOSIS — Z87891 Personal history of nicotine dependence: Secondary | ICD-10-CM | POA: Diagnosis not present

## 2020-12-25 LAB — CBC WITH DIFFERENTIAL/PLATELET
Abs Immature Granulocytes: 0.07 10*3/uL (ref 0.00–0.07)
Basophils Absolute: 0 10*3/uL (ref 0.0–0.1)
Basophils Relative: 0 %
Eosinophils Absolute: 0.1 10*3/uL (ref 0.0–0.5)
Eosinophils Relative: 1 %
HCT: 21.9 % — ABNORMAL LOW (ref 39.0–52.0)
Hemoglobin: 6.9 g/dL — ABNORMAL LOW (ref 13.0–17.0)
Immature Granulocytes: 1 %
Lymphocytes Relative: 13 %
Lymphs Abs: 1.3 10*3/uL (ref 0.7–4.0)
MCH: 27.1 pg (ref 26.0–34.0)
MCHC: 31.5 g/dL (ref 30.0–36.0)
MCV: 85.9 fL (ref 80.0–100.0)
Monocytes Absolute: 0.4 10*3/uL (ref 0.1–1.0)
Monocytes Relative: 4 %
Neutro Abs: 8 10*3/uL — ABNORMAL HIGH (ref 1.7–7.7)
Neutrophils Relative %: 81 %
Platelets: 105 10*3/uL — ABNORMAL LOW (ref 150–400)
RBC: 2.55 MIL/uL — ABNORMAL LOW (ref 4.22–5.81)
RDW: 20.4 % — ABNORMAL HIGH (ref 11.5–15.5)
WBC: 9.9 10*3/uL (ref 4.0–10.5)
nRBC: 0 % (ref 0.0–0.2)

## 2020-12-25 LAB — COMPREHENSIVE METABOLIC PANEL
ALT: 7 U/L (ref 0–44)
AST: 14 U/L — ABNORMAL LOW (ref 15–41)
Albumin: 3.1 g/dL — ABNORMAL LOW (ref 3.5–5.0)
Alkaline Phosphatase: 60 U/L (ref 38–126)
Anion gap: 11 (ref 5–15)
BUN: 33 mg/dL — ABNORMAL HIGH (ref 6–20)
CO2: 29 mmol/L (ref 22–32)
Calcium: 8.8 mg/dL — ABNORMAL LOW (ref 8.9–10.3)
Chloride: 97 mmol/L — ABNORMAL LOW (ref 98–111)
Creatinine, Ser: 5.37 mg/dL — ABNORMAL HIGH (ref 0.61–1.24)
GFR, Estimated: 12 mL/min — ABNORMAL LOW (ref >60)
Glucose, Bld: 337 mg/dL — ABNORMAL HIGH (ref 70–99)
Potassium: 3.9 mmol/L (ref 3.5–5.1)
Sodium: 137 mmol/L (ref 135–145)
Total Bilirubin: 0.8 mg/dL (ref 0.3–1.2)
Total Protein: 7.6 g/dL (ref 6.5–8.1)

## 2020-12-25 LAB — RETICULOCYTES
Immature Retic Fract: 11.9 % (ref 2.3–15.9)
RBC.: 2.61 MIL/uL — ABNORMAL LOW (ref 4.22–5.81)
Retic Count, Absolute: 40.7 10*3/uL (ref 19.0–186.0)
Retic Ct Pct: 1.6 % (ref 0.4–3.1)

## 2020-12-25 LAB — IRON AND TIBC
Iron: 33 ug/dL — ABNORMAL LOW (ref 45–182)
Saturation Ratios: 21 % (ref 17.9–39.5)
TIBC: 160 ug/dL — ABNORMAL LOW (ref 250–450)
UIBC: 127 ug/dL

## 2020-12-25 LAB — LACTATE DEHYDROGENASE: LDH: 184 U/L (ref 98–192)

## 2020-12-25 LAB — FERRITIN: Ferritin: 658 ng/mL — ABNORMAL HIGH (ref 24–336)

## 2020-12-25 LAB — FOLATE: Folate: 7.2 ng/mL (ref >5.9)

## 2020-12-25 NOTE — Progress Notes (Signed)
Pt having pain in butt to hip on left rating it 7. On dialysis on [address]. B/p increased and per wife and pt. Both said it is continuous high. Snacks mostly threw the day, drinks water good. Has 1-3 BM a week.

## 2020-12-26 ENCOUNTER — Telehealth: Payer: Self-pay

## 2020-12-26 ENCOUNTER — Encounter: Payer: Self-pay | Admitting: Oncology

## 2020-12-26 LAB — HAPTOGLOBIN: Haptoglobin: 35 mg/dL (ref 29–370)

## 2020-12-26 NOTE — Telephone Encounter (Signed)
12/26/2020- Spoke with wife and gave her the date for the blood transfusion scheduled on 03/15 @8 :30 am arrival.

## 2020-12-26 NOTE — Progress Notes (Signed)
Hematology/Oncology Consult note [facility] Telephone:([phone] [phone]  Patient Care Team: [name], MD as PCP - General (Family Medicine) Franki Cabot, MD (Neurology) Enriqueta Shutter, MD as Referring Physician (Dermatology) Pa, [place] (Optometry) Sharlene Dory, MD as Referring Physician Horald Chestnut, DO as Consulting Physician (Nephrology) Lin Landsman, MD as Consulting Physician (Gastroenterology) Neldon Labella, RN as Case Manager   Name of the patient: [name]  [id number]  [birth year]    Reason for referral-anemia   Referring physician-Dr. [name]  Date of visit: 12/26/20   History of presenting illness-patient is a 60 year old male with a past medical history significant for congestive heart failure, hypertension, diabetes, hypercholesterolemia, peripheral vascular disease s/p bilateral BKA, end-stage renal disease on dialysis referred for anemia.Looking back at his prior CBCs over the last 4 years his hemoglobin has been mostly between 7-8 and intermittently drops down to less than 7 requiring blood transfusions.  White count has been normal and he has had mild thrombocytopenia with a platelet count fluctuating between 90s to 120s since June 2021 and prior to that his platelets were normal.Recently patient was admitted to the hospital for fever and melena.  He underwent EGD and colonoscopy.  EGD was unremarkable and colonoscopy showed multiple polyps which were negative for malignancy.  He has been receiving EPO through dialysis.  He is here with his wife today.  He has baseline fatigue.  He has been having Ongoing memory issues as well.  He has also been referred to neurology for the same.  ECOG PS- 2  Pain scale- 3   Review of systems- ROS  Allergies  Allergen Reactions  . Methotrexate Other (See Comments)    Blood count drops  . Vancomycin Shortness Of Breath    Eyes watering,  SOB, wheezing  . Cefepime Other (See Comments)  . Tape     Patient Active Problem List   Diagnosis Date Noted  . Polyp of transverse colon   . Anemia in ESRD (end-stage renal disease) ([place]) 11/18/2020  . Chronic systolic CHF (congestive heart failure) ([place]) 11/18/2020  . Melena   . B12 deficiency 10/12/2020  . Cerebrovascular disease 09/12/2020  . Hyperkalemia 09/12/2020  . Mild cognitive impairment 09/30/2019  . Anemia 01/05/2019  . Peripheral vascular disease ([place]) 06/28/2018  . Pressure injury of skin 12/04/2017  . S/P bilateral BKA (below knee amputation) ([place]) 10/20/2017  . Atherosclerosis of native arteries of extremity with rest pain ([place]) 08/05/2017  . History of CVA (cerebrovascular accident) 04/15/2017  . Seizure disorder ([place]) 04/15/2017  . End stage renal failure on dialysis ([place]) 04/12/2016  . Aphthae 02/20/2016  . Leg pain 02/20/2016  . Neuropathy 02/20/2016  . Narrowing of intervertebral disc space 08/29/2015  . Vascular disorder of lower extremity 08/29/2015  . Failure of erection 08/29/2015  . Hypercholesteremia 08/29/2015  . Hypertension 08/29/2015  . Anemia due to chronic kidney disease 05/26/2015  . Venous insufficiency of leg 09/04/2014  . History of deep vein thrombosis (DVT) of lower extremity 08/16/2014  . Prostatic intraepithelial neoplasia 11/02/2013  . Elevated prostate specific antigen (PSA) 09/11/2013  . Benign prostatic hyperplasia with urinary obstruction 08/13/2013  . Spermatocele 08/13/2013  . Avitaminosis D 01/25/2013  . Type 2 diabetes mellitus with kidney complication, with long-term current use of insulin ([place]) 03/28/2012  . Crohn's disease ([place]) 08/03/2011     Past Medical History:  Diagnosis Date  . Acute metabolic encephalopathy 03/20/1307  . Anemia   .  Crohn disease ([place])   . Diabetes mellitus without complication ([place])   . DVT of lower extremity (deep venous thrombosis) ([place]) 2016  . Empyema ([place]) 05/20/2017  . Encephalopathy  12/04/2017  . Fall at home, initial encounter 09/12/2020  . Hidradenitis suppurativa   . Hypertension   . ICH (intracerebral hemorrhage) ([place])   . Peritonitis ([place]) 04/21/2017  . Pyogenic arthritis of knee ([place]) 02/04/2016  . Renal disorder   . Sepsis ([place]) 01/12/2018  . Stroke [facility])      Past Surgical History:  Procedure Laterality Date  . ABDOMINAL SURGERY    . AMPUTATION FINGER Left 06/2019   [facility]  . ANGIOPLASTY Left    left fem[facility]pop [facility] 04-11-2018  . BELOW KNEE LEG AMPUTATION Right 08/2017   [facility]  . COLONOSCOPY    . COLONOSCOPY WITH PROPOFOL N/A 10/28/2020   Procedure: COLONOSCOPY WITH PROPOFOL;  Surgeon: Lin Landsman, MD;  Location[facility] ENDOSCOPY;  Service: Gastroenterology;  Laterality: N/A;  . COLONOSCOPY WITH PROPOFOL N/A 11/21/2020   Procedure: COLONOSCOPY WITH PROPOFOL;  Surgeon: [name], MD;  Location[facility] ENDOSCOPY;  Service: Endoscopy;  Laterality: N/A;  . DIALYSIS/PERMA CATHETER INSERTION N/A 12/09/2017   Procedure: DIALYSIS/PERMA CATHETER INSERTION;  Surgeon: Katha Cabal, MD;  Location: [place] CV LAB;  Service: Cardiovascular;  Laterality: N/A;  . DIALYSIS/PERMA CATHETER INSERTION N/A 12/12/2017   Procedure: DIALYSIS/PERMA CATHETER INSERTION;  Surgeon: Algernon Huxley, MD;  Location: [place] CV LAB;  Service: Cardiovascular;  Laterality: N/A;  . DIALYSIS/PERMA CATHETER REMOVAL Left 12/09/2017   Procedure: DIALYSIS/PERMA CATHETER REMOVAL;  Surgeon: Katha Cabal, MD;  Location: [place] CV LAB;  Service: Cardiovascular;  Laterality: Left;  . ESOPHAGOGASTRODUODENOSCOPY (EGD) WITH PROPOFOL N/A 11/20/2020   Procedure: ESOPHAGOGASTRODUODENOSCOPY (EGD) WITH PROPOFOL;  Surgeon: [name], MD;  Location: [facility] ENDOSCOPY;  Service: Endoscopy;  Laterality: N/A;  . KNEE SURGERY Left 02/04/2016   [facility]  . LEG AMPUTATION THROUGH LOWER TIBIA AND FIBULA Left 06/22/2018   [facility]  . LOWER EXTREMITY ANGIOGRAPHY Right  08/08/2017   Procedure: Lower Extremity Angiography;  Surgeon: Algernon Huxley, MD;  Location: [place] CV LAB;  Service: Cardiovascular;  Laterality: Right;  . LOWER EXTREMITY ANGIOGRAPHY Right 08/22/2017   Procedure: Lower Extremity Angiography;  Surgeon: Algernon Huxley, MD;  Location: [place] CV LAB;  Service: Cardiovascular;  Laterality: Right;  . LOWER EXTREMITY INTERVENTION  08/08/2017   Procedure: LOWER EXTREMITY INTERVENTION;  Surgeon: Algernon Huxley, MD;  Location: [place] CV LAB;  Service: Cardiovascular;;  . LOWER EXTREMITY INTERVENTION  08/22/2017   Procedure: LOWER EXTREMITY INTERVENTION;  Surgeon: Algernon Huxley, MD;  Location: [place] CV LAB;  Service: Cardiovascular;;    Social History   Socioeconomic History  . Marital status: Married    Spouse name: Not on file  . Number of children: 2  . Years of education: Not on file  . Highest education level: Associate degree: occupational, Hotel manager, or vocational program  Occupational History  . Occupation: disable  Tobacco Use  . Smoking status: Former Research scientist (life sciences)  . Smokeless tobacco: Never Used  . Tobacco comment: smokes marijuana  Vaping Use  . Vaping Use: Never used  Substance and Sexual Activity  . Alcohol use: No  . Drug use: Yes    Types: Marijuana  . Sexual activity: Not Currently  Other Topics Concern  . Not on file  Social History Narrative  . Not on file   Social Determinants of Health  Financial Resource Strain: Low Risk   . Difficulty of Paying Living Expenses: Not hard at all  Food Insecurity: No Food Insecurity  . Worried About Charity fundraiser in the Last Year: Never true  . Ran Out of Food in the Last Year: Never true  Transportation Needs: No Transportation Needs  . Lack of Transportation (Medical): No  . Lack of Transportation (Non-Medical): No  Physical Activity: Inactive  . Days of Exercise per Week: 0 days  . Minutes of Exercise per Session: 0 min  Stress: No Stress Concern  Present  . Feeling of Stress : Only a little  Social Connections: Moderately Isolated  . Frequency of Communication with Friends and Family: Twice a week  . Frequency of Social Gatherings with Friends and Family: Three times a week  . Attends Religious Services: Never  . Active Member of Clubs or Organizations: No  . Attends Archivist Meetings: Never  . Marital Status: Married  Human resources officer Violence: Not At Risk  . Fear of Current or Ex-Partner: No  . Emotionally Abused: No  . Physically Abused: No  . Sexually Abused: No     Family History  Problem Relation Age of Onset  . Irritable bowel syndrome Sister   . Diabetes Sister   . Heart disease Mother   . Diabetes Mother   . Heart disease Father   . Rheumatic fever Father        as child  . Psoriasis Brother   . Arthritis Brother   . Diabetes Sister   . Diabetes Sister      Current Outpatient Medications:  .  acetaminophen (TYLENOL) 500 MG tablet, Take 1,000 mg by mouth daily as needed for moderate pain or headache. , Disp: , Rfl:  .  Adalimumab ([name] PEN) 40 MG/0.4ML PNKT, Inject 40 mg into the muscle once a week. , Disp: , Rfl:  .  Alcohol Swabs PADS, Use as directed to check blood sugar three times daily for insulin dependent type 2 diabetes., Disp: 100 each, Rfl: 12 .  amLODipine (NORVASC) 10 MG tablet, TAKE 1 TABLET(10 MG) BY MOUTH DAILY AS NEEDED (Patient taking differently: Take 10 mg by mouth daily.), Disp: 90 tablet, Rfl: 0 .  atorvastatin (LIPITOR) 80 MG tablet, TAKE 1 TABLET(80 MG) BY MOUTH DAILY (Patient taking differently: Take 80 mg by mouth daily.), Disp: 90 tablet, Rfl: 3 .  AURYXIA 1 GM 210 MG(Fe) tablet, Take 420 mg by mouth in the morning and at bedtime. , Disp: , Rfl:  .  carvedilol (COREG) 25 MG tablet, Take 1 tablet (25 mg total) by mouth 2 (two) times daily., Disp: 60 tablet, Rfl: 12 .  cyanocobalamin (,VITAMIN B-12,) 1000 MCG/ML injection, Inject 34m once a week for 4 weeks and then once a  month for 3 months, Disp: 7 mL, Rfl: 0 .  furosemide (LASIX) 80 MG tablet, TAKE 1 TABLET(80 MG) BY MOUTH TWICE DAILY (Patient taking differently: Take 80 mg by mouth 2 (two) times daily.), Disp: 180 tablet, Rfl: 0 .  gabapentin (NEURONTIN) 100 MG capsule, Take 100 mg by mouth at bedtime. Take along with 3047mcapsule at bedtime, Disp: , Rfl:  .  gabapentin (NEURONTIN) 300 MG capsule, Take 1 capsule (300 mg total) by mouth at bedtime., Disp: 90 capsule, Rfl: 1 .  hydrALAZINE (APRESOLINE) 50 MG tablet, Take 1 tablet (50 mg total) by mouth every 8 (eight) hours., Disp: 90 tablet, Rfl: 0 .  levETIRAcetam (KEPPRA) 500 MG tablet, Take 1  tablet (500 mg total) by mouth daily AND 1 tablet (500 mg total) every Monday, Wednesday, and Friday. After dialysis., Disp: 110 tablet, Rfl: 1 .  lisinopril (ZESTRIL) 20 MG tablet, Take 20 mg by mouth daily. , Disp: , Rfl:  .  sevelamer carbonate (RENVELA) 800 MG tablet, TAKE 1 TABLET(800 MG) BY MOUTH THREE TIMES DAILY, Disp: 270 tablet, Rfl: 0 .  Blood Glucose Monitoring Suppl (ONE TOUCH ULTRA 2) w/Device KIT, Use as directed to check blood sugar three times daily. E11.9 (Patient not taking: Reported on 12/25/2020), Disp: 1 each, Rfl: 0 .  insulin aspart (NOVOLOG) 100 UNIT/ML FlexPen, Inject 3 Units into the skin 3 (three) times daily with meals. (Patient not taking: Reported on 12/25/2020), Disp: , Rfl:  .  insulin glargine (LANTUS) 100 UNIT/ML injection, Inject 10 Units into the skin at bedtime.  (Patient not taking: Reported on 12/25/2020), Disp: , Rfl:    Physical exam:  Vitals:   12/25/20 1510  BP: (!) 180/74  Pulse: 65  Resp: 16  Temp: 99.1 F (37.3 C)  TempSrc: Tympanic   Physical Exam     CMP Latest Ref Rng & Units 12/25/2020  Glucose 70 - 99 mg/dL 337(H)  BUN 6 - 20 mg/dL 33(H)  Creatinine 0.61 - 1.24 mg/dL 5.37(H)  Sodium 135 - 145 mmol/L 137  Potassium 3.5 - 5.1 mmol/L 3.9  Chloride 98 - 111 mmol/L 97(L)  CO2 22 - 32 mmol/L 29  Calcium 8.9 - 10.3  mg/dL 8.8(L)  Total Protein 6.5 - 8.1 g/dL 7.6  Total Bilirubin 0.3 - 1.2 mg/dL 0.8  Alkaline Phos 38 - 126 U/L 60  AST 15 - 41 U/L 14(L)  ALT 0 - 44 U/L 7   CBC Latest Ref Rng & Units 12/25/2020  WBC 4.0 - 10.5 K/uL 9.9  Hemoglobin 13.0 - 17.0 g/dL 6.9(L)  Hematocrit 39.0 - 52.0 % 21.9(L)  Platelets 150 - 400 K/uL 105(L)     Assessment and plan- Patient is a 60 y.o. male referred for normocytic anemia  Normocytic anemia: Possibly secondary to anemia of chronic kidney disease.  He has had a level of around 8 for the last 4 years without a significant improvement or decline.  Intermittently he drops his hemoglobin to less than 7 requiring blood transfusions.  He is receiving EPO through dialysis.  Recent TSH and B12 were normal.  He is taking monthly B12 injections at home.Today I will do a complete anemia work-up including CBC with differential, CMP, ferritin and iron studies, folate, reticulocyte count, LDH, B1, B6 and myeloma panel to see if he has any other reversible cause of anemia.  If the results of these tests are negative, one option is to pursue a bone Z[place]ta biopsy now versus down the line if his cytopenias worsen.  Patient did not have any significant thrombocytopenia back in 2020 but since the last 1 year he has had some mild thrombocytopenia between 100s to 120s which could point out to a hypoproliferative Cory process such as MDS.  I will see him back in 2 weeks time for a video visit.  After we checked his labs today his hemoglobin came back at 6.9 and we will plan to give him 1 unit of blood transfusion next week   Thank you for this kind referral and the opportunity to participate in the care of this patient   Visit Diagnosis 1. Anemia of chronic kidney failure, stage 5 ([place])   2. Normocytic anemia  Dr. Randa Evens, MD, MPH [facility] at [facility] [phone] 12/26/2020

## 2020-12-29 LAB — MULTIPLE MYELOMA PANEL, SERUM
Albumin SerPl Elph-Mcnc: 3.2 g/dL (ref 2.9–4.4)
Albumin/Glob SerPl: 0.9 (ref 0.7–1.7)
Alpha 1: 0.4 g/dL (ref 0.0–0.4)
Alpha2 Glob SerPl Elph-Mcnc: 0.5 g/dL (ref 0.4–1.0)
B-Globulin SerPl Elph-Mcnc: 1.2 g/dL (ref 0.7–1.3)
Gamma Glob SerPl Elph-Mcnc: 1.8 g/dL (ref 0.4–1.8)
Globulin, Total: 3.8 g/dL (ref 2.2–3.9)
IgA: 975 mg/dL — ABNORMAL HIGH (ref 90–386)
IgG (Immunoglobin G), Serum: 1604 mg/dL (ref 603–1613)
IgM (Immunoglobulin M), Srm: 47 mg/dL (ref 20–172)
Total Protein ELP: 7 g/dL (ref 6.0–8.5)

## 2020-12-29 NOTE — Patient Instructions (Signed)
Thank you for allowing the Chronic Care Management team to participate in your care.    Patient Care Plan: Diabetes Type 2 (Adult)    Problem Identified: Disease Progression (Diabetes, Type 2)     Long-Range Goal: Disease Progression Prevented or Minimized   Start Date: 12/23/2020  Expected End Date: 04/22/2021  Priority: High  Note:   Objective:  Lab Results  Component Value Date   HGBA1C 6.7 (H) 09/13/2020 .   Lab Results  Component Value Date   CREATININE 4.55 (H) 11/20/2020   CREATININE 6.99 (H) 11/19/2020   CREATININE 5.67 (H) 11/18/2020 .   Marland Kitchen No results found for: EGFR    Current Barriers:  . Chronic Disease Management support and educational needs r/t Diabetes self-management.  Case Manager Clinical Goal(s):  Marland Kitchen Over the next 120 days, patient will demonstrate improved adherence to prescribed treatment plan for Diabetes self management as evidenced by daily monitoring and recording of CBG, adherence to prescribed diet and adherence to prescribed medication regimen.   Interventions:  . Collaboration with [name], MD regarding development and update of comprehensive plan of care as evidenced by provider attestation and co-signature . Inter-disciplinary care team collaboration (see longitudinal plan of care) . Reviewed medications. Encouraged to take medications and administer insulin as prescribed. Spouse currently assisting with medication preparation/management. Encouraged to notify team with concerns regarding prescription cost. . Provided information regarding importance of consistent blood glucose monitoring. Reports no longer monitoring d/t not wanting to perform finger sticks. He is currently taking insulin. Also notes a decreased appetite for the last month. Discussed high risk for hypoglycemia d/t not checking levels prior to administering insulin. Encouraged to monitor consistently and maintain a log. . Reviewed s/sx of hypoglycemia and hyperglycemia along  with recommended interventions. . Discussed nutritional intake. His diet is restricted d/t diabetes, heart disease and ESRD. He reports tolerating small high protein meals, usually twice a day but has noted a decreased appetite. He has attempted to consume various protein shakes. Discussed need to consider Nepro shakes. He will request a sample during dialysis tomorrow. His spouse will notify the care management team if he requires assistance with obtaining supplements. . Discussed importance of completing recommended DM preventive care. He is a bilateral below the knee amputee. Will continue monitoring skin integrity daily. He is due for an eye exam. His spouse will call to schedule an exam within the next month.   Patient Goals/Self-Care Activities Over the next 120 days, patient will:  -Administer medications as prescribed (Spouse will continue to assist) -Attend all scheduled provider appointments -Monitor blood glucose levels consistently and utilize recommended interventions -Adhere to prescribed diet restrictions -Notify provider or care management team with questions and new concerns as needed.   Follow Up Plan:  Will follow up within the next 30 days.     Patient Care Plan: Heart Failure (Adult)    Problem Identified: Symptom Exacerbation (Heart Failure)     Long-Range Goal: Symptom Exacerbation Prevented or Minimized   Start Date: 12/23/2020  Expected End Date: 04/22/2021  Priority: High  Note:   Current Barriers:  . Chronic Disease Management support and educational needs r/t CHF.  Case Manager Clinical Goal(s):  Marland Kitchen Over the next 120 days, patient will not require hospitalization or emergent treatment d/t complications r/t CHF exacerbation.  Interventions:  . Collaboration with [name], MD regarding development and update of comprehensive plan of care as evidenced by provider attestation and co-signature . Inter-disciplinary care team collaboration (see  longitudinal  plan of care) . Discussed current plan for CHF management. Encouraged to continue taking medication and diuretic as prescribed. Encouraged to continue monitoring sodium intake and comply with the fluid restrictions provided by the Nephrology team. Discussed importance of regularly assessing for symptoms of volume overload. He is a bilateral BKA and unable to weigh in the home. His spouse reports assessing for facial and abdominal edema specifically on days when he does not receive dialysis. Denies current concerns. . Reviewed s/sx of CHF exacerbation and indications for seeking immediate medical attention.   Patient Goals/Self-Care Activities:  Over the next 120 days, patient will: -Take medications as prescribed (Spouse will continue to assist) -Continue assessing for s/sx of volume overload and notify provider with concerns -Seek immediate attention if experiencing severe/unrelieved symptoms  -Adhere to recommended diet and fluid restrictions    Follow Up Plan:  Will follow up within the next month   Patient Care Plan: Fall Risk (Adult)    Problem Identified: Fall Risk     Long-Range Goal: Absence of Fall and Fall-Related Injury   Start Date: 12/23/2020  Expected End Date: 04/22/2021  Priority: High  Note:   Current Barriers:  . High Risk for Falls r/t bilateral amputations below the knee. . Unable to perform ADLs independently . Unable to perform IADLs independently   Clinical Goal(s):  Marland Kitchen Over the next 120 days, patient will not experience falls or require emergent treatment d/t fall related injuries.  Interventions:  . Collaboration with [name], MD regarding development and update of comprehensive plan of care as evidenced by provider attestation and co-signature . Inter-disciplinary care team collaboration (see longitudinal plan of care) . Reviewed medications and discussed potential side effects such as dizziness and lightheadedness. . Assessed for falls within the  last year. . Provided information regarding safety and fall prevention. Currently using a transfer bench as advised. He does note his risk for falls seems greater when transferring out of the bed into his chair. Encouraged to request assistance when needed and avoid attempting to transfer when he feels weak. Reports having a ramp to enter and exit the home. The Home Health Physical [facility] will be evaluating for in home needs such as rails and grab bars.  . Discussed need for assistance in the home. His spouse currently serves as his primary care giver. He is currently receiving Home Health services with [facility]. His spouse has considered need for long term services. The current plan is to remain in the home. Encouraged to update the care management team if his functional status declines and a higher level of care is required.   Self-Care Deficits/Patient Goals:  -Utilize wheelchair and utilize proper transfer techniques -Keep pathways clear and well lit -Avoid overreaching to prevent accidental wheelchair tilting -Adhere to recommended safety precautions -Contact the provider or the care management team if level of care changes   Follow Up Plan:  Will follow up within the next month    Patient Care Plan: Hypertension and Hyperlipidemia    Problem Identified: Hypertension and Hyperlipidemia     Long-Range Goal: Hypertension and Hyperlipidemia Monitored   Start Date: 12/23/2020  Expected End Date: 04/22/2021  Priority: High  Note:    Objective:  . Last practice recorded BP readings:  BP Readings from Last 3 Encounters:  12/02/20 (!) 180/73  11/25/20 (!) 194/82  10/28/20 (!) 165/61 .   Marland Kitchen Most recent eGFR/CrCl: No results found for: EGFR  No components found for: CRCL  Lab Results  Component Value Date   CHOL 109 09/13/2020   HDL 40 (L) 09/13/2020   LDLCALC 51 09/13/2020   TRIG 90 09/13/2020   CHOLHDL 2.7 09/13/2020      Current Barriers:  . Chronic Disease Management support  and educational needs r/t Hypertension and Hyperlipidemia.  Case Manager Clinical Goal(s):  Marland Kitchen Over the next 120 days, patient will demonstrate improved adherence to prescribed treatment plan as evidenced by taking all medications as prescribed, monitoring, and recording blood pressure and adhering recommended cardiac/renal/diabetic diet.   Interventions:  . Collaboration with [name], MD regarding development and update of comprehensive plan of care as evidenced by provider attestation and co-signature . Inter-disciplinary care team collaboration (see longitudinal plan of care) . Reviewed medications. Encouraged to continue taking as prescribed and notify provider if unable to tolerate prescribed regimen.  . Provided information regarding established blood pressure parameters. Reports currently not monitoring in the home. Notes recent readings have been significantly elevated.  Strongly encouraged to monitor and record readings. Discussed indications for notifying a provider. . Discussed compliance with recommended diet. His diet is very restricted d/t renal failure, heart disease, and diabetes. Encouraged to continue eating small high protein meals as advised by the Nephrology team. Encouraged to read nutrition labels, monitor sodium intake and avoid highly processed foods when possible. . Discussed complications of uncontrolled blood pressure. Reviewed s/sx of heart attack, stroke and worsening symptoms that require immediate medical attention.    Patient Goals/Self-Care Activities -Administer medications as prescribed (Spouse will continue to assist) -Attend all scheduled provider appointments -Monitor and record blood pressure -Adhere to recommended diet -Notify provider or care management team with questions and new concerns as needed   Follow Up Plan:  Will follow up within the next month              Mr. Ventrella was given information about Chronic Care Management  services including:  1. CCM service includes personalized support from designated clinical staff supervised by his physician, including individualized plan of care and coordination with other care providers 2. 24/7 contact phone numbers for assistance for urgent and routine care needs. 3. Service will only be billed when office clinical staff spend 20 minutes or more in a month to coordinate care. 4. Only one practitioner may furnish and bill the service in a calendar month. 5. The patient may stop CCM services at any time (effective at the end of the month) by phone call to the office staff. 6. The patient will be responsible for cost sharing (co-pay) of up to 20% of the service fee (after annual deductible is met).  Patient agreed to services and verbal consent obtained.      Mr. Kochel and his spouse verbalized understanding of the information discussed during the telephonic outreach today. Declined need for mailed/printed instructions. A member of the care management team will follow up within the next 30 days.   Cristy Friedlander [facility] Care Management [facility] [phone]

## 2020-12-30 ENCOUNTER — Other Ambulatory Visit: Payer: Self-pay

## 2020-12-30 ENCOUNTER — Inpatient Hospital Stay: Payer: Medicare Other

## 2020-12-30 ENCOUNTER — Encounter (INDEPENDENT_AMBULATORY_CARE_PROVIDER_SITE_OTHER): Payer: Medicare Other | Admitting: Family Medicine

## 2020-12-30 DIAGNOSIS — G40909 Epilepsy, unspecified, not intractable, without status epilepticus: Secondary | ICD-10-CM

## 2020-12-30 DIAGNOSIS — N186 End stage renal disease: Secondary | ICD-10-CM

## 2020-12-30 DIAGNOSIS — I132 Hypertensive heart and chronic kidney disease with heart failure and with stage 5 chronic kidney disease, or end stage renal disease: Secondary | ICD-10-CM

## 2020-12-30 DIAGNOSIS — K8020 Calculus of gallbladder without cholecystitis without obstruction: Secondary | ICD-10-CM

## 2020-12-30 DIAGNOSIS — D631 Anemia in chronic kidney disease: Secondary | ICD-10-CM

## 2020-12-30 DIAGNOSIS — N185 Chronic kidney disease, stage 5: Secondary | ICD-10-CM

## 2020-12-30 DIAGNOSIS — I130 Hypertensive heart and chronic kidney disease with heart failure and stage 1 through stage 4 chronic kidney disease, or unspecified chronic kidney disease: Secondary | ICD-10-CM | POA: Diagnosis not present

## 2020-12-30 DIAGNOSIS — I5022 Chronic systolic (congestive) heart failure: Secondary | ICD-10-CM | POA: Diagnosis not present

## 2020-12-30 DIAGNOSIS — D122 Benign neoplasm of ascending colon: Secondary | ICD-10-CM

## 2020-12-30 DIAGNOSIS — E1140 Type 2 diabetes mellitus with diabetic neuropathy, unspecified: Secondary | ICD-10-CM

## 2020-12-30 DIAGNOSIS — I69354 Hemiplegia and hemiparesis following cerebral infarction affecting left non-dominant side: Secondary | ICD-10-CM | POA: Diagnosis not present

## 2020-12-30 DIAGNOSIS — E1122 Type 2 diabetes mellitus with diabetic chronic kidney disease: Secondary | ICD-10-CM | POA: Diagnosis not present

## 2020-12-30 DIAGNOSIS — E1151 Type 2 diabetes mellitus with diabetic peripheral angiopathy without gangrene: Secondary | ICD-10-CM

## 2020-12-30 DIAGNOSIS — I2510 Atherosclerotic heart disease of native coronary artery without angina pectoris: Secondary | ICD-10-CM

## 2020-12-30 LAB — PREPARE RBC (CROSSMATCH)

## 2020-12-30 MED ORDER — SODIUM CHLORIDE 0.9% IV SOLUTION
250.0000 mL | Freq: Once | INTRAVENOUS | Status: AC
  Administered 2020-12-30: 250 mL via INTRAVENOUS
  Filled 2020-12-30: qty 250

## 2020-12-30 MED ORDER — ACETAMINOPHEN 325 MG PO TABS
650.0000 mg | ORAL_TABLET | Freq: Once | ORAL | Status: AC
  Administered 2020-12-30: 650 mg via ORAL
  Filled 2020-12-30: qty 2

## 2020-12-30 NOTE — Addendum Note (Signed)
Addended by: Kern Alberta on: 12/30/2020 08:58 AM   Modules accepted: Orders

## 2020-12-30 NOTE — Progress Notes (Signed)
Per Judeen Hammans RN, Dr Janese Banks states ok to proceed with blood tranfusion in clinic today with pt's BP of 175/67

## 2020-12-31 LAB — BPAM RBC
Blood Product Expiration Date: 202204012359
ISSUE DATE / TIME: 202203151138
Unit Type and Rh: 1700

## 2020-12-31 LAB — TYPE AND SCREEN
ABO/RH(D): B POS
Antibody Screen: NEGATIVE
Unit division: 0

## 2021-01-01 LAB — VITAMIN B6: Vitamin B6: <1 ug/L — ABNORMAL LOW (ref 5.3–46.7)

## 2021-01-08 ENCOUNTER — Inpatient Hospital Stay: Payer: Medicare Other | Admitting: Oncology

## 2021-01-11 LAB — VITAMIN B1: Vitamin B1 (Thiamine): 84.6 nmol/L (ref 66.5–200.0)

## 2021-01-13 ENCOUNTER — Ambulatory Visit: Payer: Medicare Other

## 2021-01-13 DIAGNOSIS — N186 End stage renal disease: Secondary | ICD-10-CM

## 2021-01-13 DIAGNOSIS — E1129 Type 2 diabetes mellitus with other diabetic kidney complication: Secondary | ICD-10-CM | POA: Diagnosis not present

## 2021-01-13 DIAGNOSIS — I5022 Chronic systolic (congestive) heart failure: Secondary | ICD-10-CM

## 2021-01-13 DIAGNOSIS — Z794 Long term (current) use of insulin: Secondary | ICD-10-CM

## 2021-01-13 DIAGNOSIS — Z992 Dependence on renal dialysis: Secondary | ICD-10-CM

## 2021-01-13 DIAGNOSIS — Z9181 History of falling: Secondary | ICD-10-CM

## 2021-01-13 DIAGNOSIS — I10 Essential (primary) hypertension: Secondary | ICD-10-CM | POA: Diagnosis not present

## 2021-01-13 NOTE — Chronic Care Management (AMB) (Signed)
Chronic Care Management   Follow Up Note   01/13/2021 Name: [name] MRN: [id number] DOB: [birth year]  Primary Care Provider: [name], MD Reason for referral : Chronic Care Management  [name] is a 60 y.o. year old male who is a primary care patient of [name], [name], MD.   Review of Mr. Lawhorn status, including review of consultants reports, relevant labs and test results was conducted today. Collaboration with appropriate care team members was performed as part of the comprehensive evaluation and provision of chronic care management services.      SDOH (Social Determinants of Health) assessments performed: No     Outpatient Encounter Medications as of 01/13/2021  Medication Sig Note  . acetaminophen (TYLENOL) 500 MG tablet Take 1,000 mg by mouth daily as needed for moderate pain or headache.    . Adalimumab ([name] PEN) 40 MG/0.4ML PNKT Inject 40 mg into the muscle once a week.    . Alcohol Swabs PADS Use as directed to check blood sugar three times daily for insulin dependent type 2 diabetes.   Marland Kitchen amLODipine (NORVASC) 10 MG tablet TAKE 1 TABLET(10 MG) BY MOUTH DAILY AS NEEDED (Patient taking differently: Take 10 mg by mouth daily.)   . atorvastatin (LIPITOR) 80 MG tablet TAKE 1 TABLET(80 MG) BY MOUTH DAILY (Patient taking differently: Take 80 mg by mouth daily.)   . AURYXIA 1 GM 210 MG(Fe) tablet Take 420 mg by mouth in the morning and at bedtime.  12/23/2020: Reports taking 2 tablets three times a day.  . Blood Glucose Monitoring Suppl (ONE TOUCH ULTRA 2) w/Device KIT Use as directed to check blood sugar three times daily. E11.9 (Patient not taking: Reported on 12/25/2020)   . carvedilol (COREG) 25 MG tablet Take 1 tablet (25 mg total) by mouth 2 (two) times daily.   . cyanocobalamin (,VITAMIN B-12,) 1000 MCG/ML injection Inject 68m once a week for 4 weeks and then once a month for 3 months 12/25/2020: Once a month now  . furosemide (LASIX) 80 MG tablet TAKE 1  TABLET(80 MG) BY MOUTH TWICE DAILY (Patient taking differently: Take 80 mg by mouth 2 (two) times daily.)   . gabapentin (NEURONTIN) 100 MG capsule Take 100 mg by mouth at bedtime. Take along with 3011mcapsule at bedtime   . gabapentin (NEURONTIN) 300 MG capsule Take 1 capsule (300 mg total) by mouth at bedtime.   . hydrALAZINE (APRESOLINE) 50 MG tablet Take 1 tablet (50 mg total) by mouth every 8 (eight) hours. 12/23/2020: Reports taking twice a day  . insulin aspart (NOVOLOG) 100 UNIT/ML FlexPen Inject 3 Units into the skin 3 (three) times daily with meals. (Patient not taking: Reported on 12/25/2020)   . insulin glargine (LANTUS) 100 UNIT/ML injection Inject 10 Units into the skin at bedtime.  (Patient not taking: Reported on 12/25/2020)   . levETIRAcetam (KEPPRA) 500 MG tablet Take 1 tablet (500 mg total) by mouth daily AND 1 tablet (500 mg total) every Monday, Wednesday, and Friday. After dialysis.   . Marland Kitchenisinopril (ZESTRIL) 20 MG tablet Take 20 mg by mouth daily.  12/23/2020: Reports dose was increased to 4034maily  . sevelamer carbonate (RENVELA) 800 MG tablet TAKE 1 TABLET(800 MG) BY MOUTH THREE TIMES DAILY    No facility-administered encounter medications on file as of 01/13/2021.     Objective:  Patient Care Plan: ESRD  Problem Identified: Disease Progression     Long-Range Goal: Disease Progression Prevented or Minimized   Start  Date: 01/13/2021  Expected End Date: 06/13/2021  Priority: High   Current Barriers:  . Chronic Disease Management support and educational needs r/t End Stage Renal Disease.   Goals: . Over the next 120 days, patient will not require hospitalization or emergent care d/t complications r/t End Stage Renal Disease.  Interventions:  . Collaboration with [name], MD regarding development and update of comprehensive plan of care as evidenced by provider attestation and co-signature . Inter-disciplinary care team collaboration (see longitudinal plan of  care) . Reviewed current treatment plan. Currently attends hemodialysis on Monday, Wednesday and Friday. His condition is complicated by anemia. Reports requiring a blood transfusion two weeks ago. He is also scheduled for imaging and follow up with the vascular team due to complications/bleeding from his fistula.  . Reviewed medications. Advised to administer as prescribed and adjust/hold medications when instructed by his Nephrologist. Encouraged to notify the care management team with concerns r/t medication management or prescription costs. . Discussed nutritional intake and compliance with the recommended diet. Reports decreased appetite due to fatigue after dialysis treatments. He reports eating breakfast without difficulty Notices a decreased appetite for the remainder of the day. We discussed options for Nepro as a nutritional supplement, but he declined. He does report eating a high protein breakfast and a small protein snack later in the day. His spouse in interested in options for an appetite stimulate if his appetite does not improve.   Self-Care Activities/Patient Goals: -Administer medications as prescribed. -Complete recommended labs and attend provider appointments as scheduled. -Adhere to recommended high protein diet and fluid restrictions. -Follow recommended safety precautions. -Contact the provider or care management team with questions and new concerns as needed.   Follow Up Plan Will follow up next month       Patient Care Plan: Fall Risk (Adult)    Problem Identified: Fall Risk     Long-Range Goal: Absence of Fall and Fall-Related Injury   Start Date: 12/23/2020  Expected End Date: 04/22/2021  Priority: High  Note:   Current Barriers:  . High Risk for Falls r/t bilateral amputations below the knee. . Unable to perform ADLs independently . Unable to perform IADLs independently   Clinical Goal(s):  Marland Kitchen Over the next 120 days, patient will not experience falls or  require emergent treatment d/t fall related injuries.  Interventions:  . Collaboration with [name], MD regarding development and update of comprehensive plan of care as evidenced by provider attestation and co-signature . Inter-disciplinary care team collaboration (see longitudinal plan of care) . Reviewed information regarding safety and fall prevention. Assessed for recent falls. Reports falling twice within the past few weeks. One fall occurred when attempting to transfer from his chair to the shower bench. Reports another fall occurred when attempting to transfer out of bed. Denies injuries. Reports not informing his provider to complete a follow-up evaluation. His spouse indicates that he did not notify her when attempting to transfer and she was initially unaware of his fall. He denies concerns complaints of discomfort today. Discussed need to ensure assistance is available when attempting to transfer. He was strongly advised to avoid attempting to transfer when he feels weak due his high risk for falls.  . Reviewed in home needs.  His spouse continues to serve as his primary care giver. Declined current need for additional assistance. Agreed to update the team if this changes and assistance is needed.    Self-Care Deficits/Patient Goals:  -Utilize wheelchair and utilize proper transfer  techniques -Avoid attempting to transfer without assistance or when he feels weak. -Keep pathways clear and well lit -Avoid overreaching to prevent accidental wheelchair tilting -Adhere to recommended safety precautions -Contact the provider or the care management team if level of care changes and additional assistance is needed in the home.   Follow Up Plan:  Will follow up next month         PLAN A member of the care management team will follow up with Mr. Fallert next month.     Cristy Friedlander [facility] [phone]

## 2021-01-19 NOTE — Patient Instructions (Signed)
Thank you for allowing the Chronic Care Management team to participate in your care. It was a pleasure speaking with you today. Please feel free to contact me with questions.   Goals Addressed: Patient Care Plan: ESRD  Problem Identified: Disease Progression     Long-Range Goal: Disease Progression Prevented or Minimized   Start Date: 01/13/2021  Expected End Date: 06/13/2021  Priority: High   Current Barriers:  . Chronic Disease Management support and educational needs r/t End Stage Renal Disease.   Goals: . Over the next 120 days, patient will not require hospitalization or emergent care d/t complications r/t End Stage Renal Disease.  Interventions:  . Collaboration with [name], MD regarding development and update of comprehensive plan of care as evidenced by provider attestation and co-signature . Inter-disciplinary care team collaboration (see longitudinal plan of care) . Reviewed current treatment plan. Currently attends hemodialysis on Monday, Wednesday and Friday. His condition is complicated by anemia. Reports requiring a blood transfusion two weeks ago. He is also scheduled for imaging and follow up with the vascular team due to complications/bleeding from his fistula.  . Reviewed medications. Advised to administer as prescribed and adjust/hold medications when instructed by his Nephrologist. Encouraged to notify the care management team with concerns r/t medication management or prescription costs. . Discussed nutritional intake and compliance with the recommended diet. Reports decreased appetite due to fatigue after dialysis treatments. He reports eating breakfast without difficulty Notices a decreased appetite for the remainder of the day. We discussed options for Nepro as a nutritional supplement, but he declined. He does report eating a high protein breakfast and a small protein snack later in the day. His spouse in interested in options for an appetite stimulate if  his appetite does not improve.   Self-Care Activities/Patient Goals: -Administer medications as prescribed. -Complete recommended labs and attend provider appointments as scheduled. -Adhere to recommended high protein diet and fluid restrictions. -Follow recommended safety precautions. -Contact the provider or care management team with questions and new concerns as needed.   Follow Up Plan Will follow up next month       Patient Care Plan: Fall Risk (Adult)    Problem Identified: Fall Risk     Long-Range Goal: Absence of Fall and Fall-Related Injury   Start Date: 12/23/2020  Expected End Date: 04/22/2021  Priority: High  Note:   Current Barriers:  . High Risk for Falls r/t bilateral amputations below the knee. . Unable to perform ADLs independently . Unable to perform IADLs independently   Clinical Goal(s):  Marland Kitchen Over the next 120 days, patient will not experience falls or require emergent treatment d/t fall related injuries.  Interventions:  . Collaboration with [name], MD regarding development and update of comprehensive plan of care as evidenced by provider attestation and co-signature . Inter-disciplinary care team collaboration (see longitudinal plan of care) . Reviewed information regarding safety and fall prevention. Assessed for recent falls. Reports falling twice within the past few weeks. One fall occurred when attempting to transfer from his chair to the shower bench. Reports another fall occurred when attempting to transfer out of bed. Denies injuries. Reports not informing his provider to complete a follow-up evaluation. His spouse indicates that he did not notify her when attempting to transfer and she was initially unaware of his fall. He denies concerns complaints of discomfort today. Discussed need to ensure assistance is available when attempting to transfer. He was strongly advised to avoid attempting to transfer when he  feels weak due his high risk for  falls.  . Reviewed in home needs.  His spouse continues to serve as his primary care giver. Declined current need for additional assistance. Agreed to update the team if this changes and assistance is needed.    Self-Care Deficits/Patient Goals:  -Utilize wheelchair and utilize proper transfer techniques -Avoid attempting to transfer without assistance or when he feels weak. -Keep pathways clear and well lit -Avoid overreaching to prevent accidental wheelchair tilting -Adhere to recommended safety precautions -Contact the provider or the care management team if level of care changes and additional assistance is needed in the home.   Follow Up Plan:  Will follow up next month             [name] verbalized understanding of the information discussed during the telephonic outreach today. Declined need for mailed/printed instructions. A member of the care management team will follow up with [name] next month.     [name] [facility] Care Management [facility] [phone]

## 2021-01-20 ENCOUNTER — Telehealth: Payer: Self-pay | Admitting: Family Medicine

## 2021-01-20 ENCOUNTER — Other Ambulatory Visit: Payer: Self-pay | Admitting: Family Medicine

## 2021-01-20 MED ORDER — HYDRALAZINE HCL 50 MG PO TABS: 50.0000 mg | ORAL_TABLET | Freq: Three times a day (TID) | ORAL | 5 refills | Status: DC

## 2021-01-20 NOTE — Telephone Encounter (Signed)
We received a PA for Novolog insulin on this patient, but I think he's off of insulin now. Can you please check with patient and see if he is still using Novolog. If so we will need to change to humalog due to insurance formulary.

## 2021-01-20 NOTE — Telephone Encounter (Signed)
Spoke with patients wife who states that patient is no longer taking Novolog, she states that he still has a bottle of Novolog at home that is not expired. Patients wife request that you send in refill for Hydralazine to [facility] in [place], looking in chart it looks like on 12/23/20 it was documented that patient is stating medication twice a day.  Please review. KW

## 2021-01-21 ENCOUNTER — Other Ambulatory Visit (INDEPENDENT_AMBULATORY_CARE_PROVIDER_SITE_OTHER): Payer: Self-pay | Admitting: Vascular Surgery

## 2021-01-21 DIAGNOSIS — M7989 Other specified soft tissue disorders: Secondary | ICD-10-CM

## 2021-01-22 ENCOUNTER — Encounter (INDEPENDENT_AMBULATORY_CARE_PROVIDER_SITE_OTHER): Payer: Self-pay | Admitting: Vascular Surgery

## 2021-01-22 ENCOUNTER — Other Ambulatory Visit: Payer: Self-pay

## 2021-01-22 ENCOUNTER — Ambulatory Visit (INDEPENDENT_AMBULATORY_CARE_PROVIDER_SITE_OTHER): Payer: Medicare Other

## 2021-01-22 ENCOUNTER — Ambulatory Visit (INDEPENDENT_AMBULATORY_CARE_PROVIDER_SITE_OTHER): Payer: Medicare Other | Admitting: Vascular Surgery

## 2021-01-22 VITALS — BP 157/80 | HR 61 | Ht 71.0 in | Wt 142.0 lb

## 2021-01-22 DIAGNOSIS — E7800 Pure hypercholesterolemia, unspecified: Secondary | ICD-10-CM

## 2021-01-22 DIAGNOSIS — I739 Peripheral vascular disease, unspecified: Secondary | ICD-10-CM | POA: Diagnosis not present

## 2021-01-22 DIAGNOSIS — R600 Localized edema: Secondary | ICD-10-CM

## 2021-01-22 DIAGNOSIS — E1129 Type 2 diabetes mellitus with other diabetic kidney complication: Secondary | ICD-10-CM

## 2021-01-22 DIAGNOSIS — N186 End stage renal disease: Secondary | ICD-10-CM | POA: Diagnosis not present

## 2021-01-22 DIAGNOSIS — T829XXS Unspecified complication of cardiac and vascular prosthetic device, implant and graft, sequela: Secondary | ICD-10-CM

## 2021-01-22 DIAGNOSIS — M7989 Other specified soft tissue disorders: Secondary | ICD-10-CM

## 2021-01-22 DIAGNOSIS — Z794 Long term (current) use of insulin: Secondary | ICD-10-CM

## 2021-01-22 DIAGNOSIS — T829XXA Unspecified complication of cardiac and vascular prosthetic device, implant and graft, initial encounter: Secondary | ICD-10-CM | POA: Insufficient documentation

## 2021-01-22 DIAGNOSIS — D631 Anemia in chronic kidney disease: Secondary | ICD-10-CM

## 2021-01-22 NOTE — Progress Notes (Signed)
MRN : [id number]  [name] is a 60 y.o. (01/11/1961) male who presents with chief complaint of  Chief Complaint  Patient presents with  . New Patient (Initial Visit)    U/S needs to establish -Lov 2018  .  History of Present Illness:   The patient returns to the office for follow up regarding problem with the dialysis access. Currently the patient is maintained via a left arm brachial cephalic fistula  The patient has had multiple failed upper extremity accesses.  The patient notes a significant increase in bleeding time after decannulation.  The patient has also been informed that there is increased recirculation.    The patient denies hand pain or other symptoms consistent with steal phenomena.  No significant arm swelling.  The patient denies redness or swelling at the access site. The patient denies fever or chills at home or while on dialysis.  The patient denies amaurosis fugax or recent TIA symptoms. There are no recent neurological changes noted. The patient denies claudication symptoms or rest pain symptoms. The patient denies history of DVT, PE or superficial thrombophlebitis. The patient denies recent episodes of angina or shortness of breath.   Duplex ultrasound of the left arm AV fistula shows markedly reduced flow volume.   Current Meds  Medication Sig  . acetaminophen (TYLENOL) 500 MG tablet Take 1,000 mg by mouth daily as needed for moderate pain or headache.   . Adalimumab ([name] PEN) 40 MG/0.4ML PNKT Inject 40 mg into the muscle once a week.   . Alcohol Swabs PADS Use as directed to check blood sugar three times daily for insulin dependent type 2 diabetes.  Marland Kitchen amLODipine (NORVASC) 10 MG tablet TAKE 1 TABLET(10 MG) BY MOUTH DAILY AS NEEDED (Patient taking differently: Take 10 mg by mouth daily.)  . amoxicillin-clavulanate (AUGMENTIN) 875-125 MG tablet Take twice daily as needed for 2 weeks for flares  . atorvastatin (LIPITOR) 80 MG tablet TAKE 1 TABLET(80  MG) BY MOUTH DAILY (Patient taking differently: Take 80 mg by mouth daily.)  . AURYXIA 1 GM 210 MG(Fe) tablet Take 420 mg by mouth in the morning and at bedtime.   . Blood Glucose Monitoring Suppl (ONE TOUCH ULTRA 2) w/Device KIT Use as directed to check blood sugar three times daily. E11.9  . carvedilol (COREG) 25 MG tablet Take 1 tablet (25 mg total) by mouth 2 (two) times daily.  . cyanocobalamin (,VITAMIN B-12,) 1000 MCG/ML injection Inject 29m once a week for 4 weeks and then once a month for 3 months  . furosemide (LASIX) 80 MG tablet TAKE 1 TABLET(80 MG) BY MOUTH TWICE DAILY (Patient taking differently: Take 80 mg by mouth 2 (two) times daily.)  . gabapentin (NEURONTIN) 100 MG capsule Take 100 mg by mouth at bedtime. Take along with 3052mcapsule at bedtime  . gabapentin (NEURONTIN) 300 MG capsule Take 1 capsule (300 mg total) by mouth at bedtime.  . hydrALAZINE (APRESOLINE) 50 MG tablet Take 1 tablet (50 mg total) by mouth every 8 (eight) hours.  . insulin glargine (LANTUS) 100 UNIT/ML injection Inject 10 Units into the skin at bedtime.  . levETIRAcetam (KEPPRA) 500 MG tablet Take 1 tablet (500 mg total) by mouth daily AND 1 tablet (500 mg total) every Monday, Wednesday, and Friday. After dialysis.  . Marland Kitchenisinopril (ZESTRIL) 20 MG tablet Take 20 mg by mouth daily.   . sevelamer carbonate (RENVELA) 800 MG tablet TAKE 1 TABLET(800 MG) BY MOUTH THREE TIMES DAILY  . spironolactone (  ALDACTONE) 25 MG tablet Take 1 tablet by mouth daily.    Past Medical History:  Diagnosis Date  . Acute metabolic encephalopathy 11/26/2444  . Anemia   . Crohn disease ([place])   . Diabetes mellitus without complication ([place])   . DVT of lower extremity (deep venous thrombosis) ([place]) 2016  . Empyema ([place]) 05/20/2017  . Encephalopathy 12/04/2017  . Fall at home, initial encounter 09/12/2020  . Hidradenitis suppurativa   . Hypertension   . ICH (intracerebral hemorrhage) ([place])   . Peritonitis ([place]) 04/21/2017  . Pyogenic  arthritis of knee ([place]) 02/04/2016  . Renal disorder   . Sepsis ([place]) 01/12/2018  . Stroke [facility])     Past Surgical History:  Procedure Laterality Date  . ABDOMINAL SURGERY    . AMPUTATION FINGER Left 06/2019   [facility]  . ANGIOPLASTY Left    left fem-pop at [facility] 04-11-2018  . BELOW KNEE LEG AMPUTATION Right 08/2017   [facility]  . COLONOSCOPY    . COLONOSCOPY WITH PROPOFOL N/A 10/28/2020   Procedure: COLONOSCOPY WITH PROPOFOL;  Surgeon: Lin Landsman, MD;  Location: [facility] ENDOSCOPY;  Service: Gastroenterology;  Laterality: N/A;  . COLONOSCOPY WITH PROPOFOL N/A 11/21/2020   Procedure: COLONOSCOPY WITH PROPOFOL;  Surgeon: [name], MD;  Location: [facility] ENDOSCOPY;  Service: Endoscopy;  Laterality: N/A;  . DIALYSIS/PERMA CATHETER INSERTION N/A 12/09/2017   Procedure: DIALYSIS/PERMA CATHETER INSERTION;  Surgeon: Katha Cabal, MD;  Location: [place] CV LAB;  Service: Cardiovascular;  Laterality: N/A;  . DIALYSIS/PERMA CATHETER INSERTION N/A 12/12/2017   Procedure: DIALYSIS/PERMA CATHETER INSERTION;  Surgeon: Algernon Huxley, MD;  Location: [place] CV LAB;  Service: Cardiovascular;  Laterality: N/A;  . DIALYSIS/PERMA CATHETER REMOVAL Left 12/09/2017   Procedure: DIALYSIS/PERMA CATHETER REMOVAL;  Surgeon: Katha Cabal, MD;  Location: [place] CV LAB;  Service: Cardiovascular;  Laterality: Left;  . ESOPHAGOGASTRODUODENOSCOPY (EGD) WITH PROPOFOL N/A 11/20/2020   Procedure: ESOPHAGOGASTRODUODENOSCOPY (EGD) WITH PROPOFOL;  Surgeon: [name], MD;  Location: [facility] ENDOSCOPY;  Service: Endoscopy;  Laterality: N/A;  . KNEE SURGERY Left 02/04/2016   [facility]  . LEG AMPUTATION THROUGH LOWER TIBIA AND FIBULA Left 06/22/2018   [facility]  . LOWER EXTREMITY ANGIOGRAPHY Right 08/08/2017   Procedure: Lower Extremity Angiography;  Surgeon: Algernon Huxley, MD;  Location: [place] CV LAB;  Service: Cardiovascular;  Laterality: Right;  . LOWER EXTREMITY ANGIOGRAPHY  Right 08/22/2017   Procedure: Lower Extremity Angiography;  Surgeon: Algernon Huxley, MD;  Location: [place] CV LAB;  Service: Cardiovascular;  Laterality: Right;  . LOWER EXTREMITY INTERVENTION  08/08/2017   Procedure: LOWER EXTREMITY INTERVENTION;  Surgeon: Algernon Huxley, MD;  Location: [place] CV LAB;  Service: Cardiovascular;;  . LOWER EXTREMITY INTERVENTION  08/22/2017   Procedure: LOWER EXTREMITY INTERVENTION;  Surgeon: Algernon Huxley, MD;  Location: [place] CV LAB;  Service: Cardiovascular;;    Social History Social History   Tobacco Use  . Smoking status: Former Research scientist (life sciences)  . Smokeless tobacco: Never Used  . Tobacco comment: smokes marijuana  Vaping Use  . Vaping Use: Never used  Substance Use Topics  . Alcohol use: No  . Drug use: Yes    Types: Marijuana    Family History Family History  Problem Relation Age of Onset  . Irritable bowel syndrome Sister   . Diabetes Sister   . Heart disease Mother   . Diabetes Mother   . Heart disease Father   . Rheumatic fever Father  as child  . Psoriasis Brother   . Arthritis Brother   . Diabetes Sister   . Diabetes Sister   No family history of bleeding/clotting disorders, porphyria or autoimmune disease   Allergies  Allergen Reactions  . Methotrexate Other (See Comments)    Blood count drops  . Vancomycin Shortness Of Breath    Eyes watering, SOB, wheezing  . Cefepime Other (See Comments)  . Tape      REVIEW OF SYSTEMS (Negative unless checked)  Constitutional: _0 Weight loss  _1 Fever  _2 Chills Cardiac: _3 Chest pain   _4 Chest pressure   _5 Palpitations   _6 Shortness of breath when laying flat   _7 Shortness of breath with exertion. Vascular:  _8 Pain in legs with walking   _9 Pain in legs at rest  _10 History of DVT   _11 Phlebitis   _12 Swelling in legs   _13 Varicose veins   _14 Non-healing ulcers Pulmonary:   _15 Uses home oxygen   _16 Productive cough   _17 Hemoptysis   _18 Wheeze  _19 COPD   _20 Asthma Neurologic:   _21 Dizziness   _22 Seizures   _23 History of stroke   _24 History of TIA  _25 Aphasia   _26 Vissual changes   _27 Weakness or numbness in arm   _28 Weakness or numbness in leg Musculoskeletal:   _29 Joint swelling   _30 Joint pain   _31 Low back pain Hematologic:  _32 Easy bruising  _33 Easy bleeding   _34 Hypercoagulable state   _35 Anemic Gastrointestinal:  _36 Diarrhea   _37 Vomiting  _38 Gastroesophageal reflux/heartburn   _39 Difficulty swallowing. Genitourinary:  _40 Chronic kidney disease   _41 Difficult urination  _42 Frequent urination   _43 Blood in urine Skin:  _44 Rashes   _45 Ulcers  Psychological:  _46 History of anxiety   _47  History of major depression.  Physical Examination  Vitals:   01/22/21 1551  BP: (!) 157/80  Pulse: 61  Weight: 142 lb (64.4 kg)  Height: _48  (1.803 m)   Body mass index is 19.8 kg/m. Gen: WD/WN, NAD Head: Painter/AT, No temporalis wasting.  Ear/Nose/Throat: Hearing grossly intact, nares w/o erythema or drainage, poor dentition Eyes: PER, EOMI, sclera nonicteric.  Neck: Supple, no masses.  No bruit or JVD.  Pulmonary:  Good air movement, clear to auscultation bilaterally, no use of accessory muscles.  Cardiac: RRR, normal S1, S2, no Murmurs. Vascular:  Left brachial cephalic fistula mild aneurysmal changes weak thrill and weak bruit Vessel Right Left  Radial Palpable Palpable  Brachial Palpable Palpable  PT BKA BKA  DP BKA BKA  Gastrointestinal: soft, non-distended. No guarding/no peritoneal signs.  Musculoskeletal: M/S 5/5 throughout.  No deformity or atrophy.  Neurologic: CN 2-12 intact. Pain and light touch intact in extremities.  Symmetrical.  Speech is fluent. Motor exam as listed above. Psychiatric: Judgment intact, Mood & affect appropriate for pt's clinical situation. Dermatologic: No rashes or ulcers noted.  No changes consistent with cellulitis.   CBC Lab Results  Component Value Date   WBC 9.9 12/25/2020   HGB 6.9 (L) 12/25/2020   HCT 21.9 (L) 12/25/2020   MCV 85.9 12/25/2020    PLT 105 (L) 12/25/2020    BMET    Component Value Date/Time   NA 137 12/25/2020 1550   NA 140 05/27/2020 1006   NA 134 (L) 07/24/2014 1524   K 3.9 12/25/2020 1550   K 4.3 07/24/2014 1524   CL 97 (L) 12/25/2020 1550   CL 103 07/24/2014 1524   CO2 29 12/25/2020 1550   CO2 21 07/24/2014 1524   GLUCOSE 337 (H) 12/25/2020 1550   GLUCOSE 168 (H) 07/24/2014 1524   BUN 33 (H)  12/25/2020 1550   BUN 23 05/27/2020 1006   BUN 62 (H) 07/24/2014 1524   CREATININE 5.37 (H) 12/25/2020 1550   CREATININE 4.95 (H) 07/24/2014 1524   CALCIUM 8.8 (L) 12/25/2020 1550   CALCIUM 9.3 07/24/2014 1524   GFRNONAA 12 (L) 12/25/2020 1550   GFRNONAA 13 (L) 07/24/2014 1524   GFRAA 14 (L) 05/27/2020 1006   GFRAA 16 (L) 07/24/2014 1524   CrCl cannot be calculated (Patient's most recent lab result is older than the maximum 21 days allowed.).  COAG Lab Results  Component Value Date   INR 1.1 11/18/2020   INR 1.1 09/12/2020   INR 1.0 01/05/2019    Radiology VAS [place] (AVF,AVG)  Result Date: 01/22/2021 DIALYSIS ACCESS Access Type: Brachial-cephalic AVF. Performing Technologist: Almira Coaster RVS  Examination Guidelines: A complete evaluation includes B-mode imaging, spectral Doppler, color Doppler, and power Doppler as needed of all accessible portions of each vessel. Unilateral testing is considered an integral part of a complete examination. Limited examinations for reoccurring indications may be performed as noted.  Findings: +--------------------+----------+-----------------+--------+ AVF                 PSV (cm/s)Flow Vol (mL/min)Comments +--------------------+----------+-----------------+--------+ Native artery inflow   333           727                +--------------------+----------+-----------------+--------+ AVF Anastomosis        531                              +--------------------+----------+-----------------+--------+   +---------------+----------+-------------+----------+------------------+ OUTFLOW VEIN   PSV (cm/s)Diameter (cm)Depth (cm)     Describe      +---------------+----------+-------------+----------+------------------+ Subclavian vein    73                                              +---------------+----------+-------------+----------+------------------+ Confluence         68                                              +---------------+----------+-------------+----------+------------------+ Shoulder           52                                              +---------------+----------+-------------+----------+------------------+ Prox UA            39                                              +---------------+----------+-------------+----------+------------------+ Mid UA            114                           change in Diameter +---------------+----------+-------------+----------+------------------+ Dist UA           646  change in Diameter +---------------+----------+-------------+----------+------------------+  +--------------+-------------+---------+---------+----------+------------------+               Diameter (cm)  Depth  BranchingPSV (cm/s)   Flow Volume                                  (cm)                           (ml/min)      +--------------+-------------+---------+---------+----------+------------------+ Lt Rad Art                                       99                       Dist                                                                      +--------------+-------------+---------+---------+----------+------------------+  Summary: The Left Brachial Cephalic AVF appears to be patent throughout; Flow Volume appears to be Normal. Multiple Diameter changes seen in the Left Brachial Cephalic AVF. An Aneurysm was seen at the Take off of the AVF in the Left Arm measuring 1.50cms x 1.33cms.  *See table(s)  above for measurements and observations.  Diagnosing physician: Hortencia Pilar MD Electronically signed by Hortencia Pilar MD on 01/22/2021 at 5:03:16 PM.    --------------------------------------------------------------------------------   Final      Assessment/Plan 1. Complication of vascular access for dialysis, sequela Recommend:  The patient is experiencing increasing problems with their dialysis access.  Patient should have a fistulagram with the intention for intervention.  The intention for intervention is to restore appropriate flow and prevent thrombosis and possible loss of the access.  As well as improve the quality of dialysis therapy.  The risks, benefits and alternative therapies were reviewed in detail with the patient.  All questions were answered.  The patient agrees to proceed with angio/intervention.    2. Anemia in ESRD (end-stage renal disease) ([place]) At the present time the patient has adequate dialysis access.  Continue hemodialysis as ordered without interruption.  Avoid nephrotoxic medications and dehydration.  Further plans per nephrology  3. Type 2 diabetes mellitus with other diabetic kidney complication, with long-term current use of insulin (HCC) Continue hypoglycemic medications as already ordered, these medications have been reviewed and there are no changes at this time.  Hgb A1C to be monitored as already arranged by primary service   4. Peripheral vascular disease ([place])  Recommend:  The patient has evidence of atherosclerosis of the lower extremities with claudication.  The patient does not voice lifestyle limiting changes at this point in time.  Noninvasive studies do not suggest clinically significant change.  No invasive studies, angiography or surgery at this time The patient should continue walking and begin a more formal exercise program.  The patient should continue antiplatelet therapy and aggressive treatment of the lipid  abnormalities  No changes in the patient's medications at this time   5. Hypercholesteremia Continue statin as ordered and reviewed, no changes at this  time     Hortencia Pilar, MD  01/22/2021 8:13 PM

## 2021-01-27 ENCOUNTER — Other Ambulatory Visit: Payer: Self-pay

## 2021-01-27 ENCOUNTER — Ambulatory Visit (INDEPENDENT_AMBULATORY_CARE_PROVIDER_SITE_OTHER): Payer: Medicare Other | Admitting: Gastroenterology

## 2021-01-27 ENCOUNTER — Encounter: Payer: Self-pay | Admitting: Gastroenterology

## 2021-01-27 VITALS — BP 206/76 | HR 71 | Temp 98.4°F | Ht 71.0 in | Wt 147.0 lb

## 2021-01-27 DIAGNOSIS — N189 Chronic kidney disease, unspecified: Secondary | ICD-10-CM | POA: Diagnosis not present

## 2021-01-27 DIAGNOSIS — Z862 Personal history of diseases of the blood and blood-forming organs and certain disorders involving the immune mechanism: Secondary | ICD-10-CM | POA: Diagnosis not present

## 2021-01-27 NOTE — Progress Notes (Signed)
Cephas Darby, MD [address]  [place]  [place], [place] [address]  Main: [phone]  Fax: [phone]    Gastroenterology Consultation  Referring Provider:     [name], MD Primary Care Physician:  [name], MD Primary Gastroenterologist:  Dr. Cephas Darby Reason for Consultation:     ?  Crohn's disease        HPI:   [name] is a 60 y.o. male referred by Dr. [name], MD  for consultation & management of possible Crohn's.  Patient has history of diabetes complicated by peripheral artery disease, bilateral below-knee amputation, history of stroke with residual left arm weakness, end-stage renal disease on hemodialysis, hidradenitis suppurativa currently maintained on [name] weekly.  Patient was originally referred for screening colonoscopy and given the diagnosis of Crohn's disease, he was scheduled to be seen in the office today.  Apparently, patient reports that the diagnosis of Crohn's was not definite.  He had 2 colonoscopies in the past, no one mentioned about it.  He denies any GI symptoms.  He does have severe anemia of chronic kidney disease.  Ferritin levels are elevated.  Currently taking oral iron.  Patient is accompanied by his wife today.  She reports gradual loss of appetite, decreased p.o. intake.  Patient lost several pounds within last 1 to 2 years due to several medical problems  Patient does not smoke, never smoked, does not drink alcohol  Follow-up visit 01/27/21 Patient is referred to me for follow-up of anemia.  Patient's PCP, Dr. Caryn Section reached out to me few weeks ago due to drop in hemoglobin to 6.9.  Patient underwent bidirectional with no source of anemia identified.  He was also evaluated by Dr. Janese Banks, working diagnosis is anemia of chronic kidney disease.  He does not have evidence of iron deficiency, B12 or folate deficiency.  Patient denies any black stools, rectal bleeding.  Patient denies any GI symptoms.  NSAIDs:  None  Antiplts/Anticoagulants/Anti thrombotics: None  GI Procedures:  Upper endoscopy 11/20/2020 - Small hiatal hernia. - Normal stomach. - Duodenitis. - No specimens collected.  Colonoscopy 11/21/2020 - Preparation of the colon was fair. - The examined portion of the ileum was normal. Biopsied. - One 9 mm polyp at the ileocecal valve, removed with a hot snare. Resected and retrieved. - Two 2 to 3 mm polyps in the ascending colon, removed with a cold biopsy forceps. Resected and retrieved. - Five 6 to 9 mm polyps in the transverse colon, removed with a cold snare. Resected and retrieved. - Five 4 to 7 mm polyps in the descending colon, removed with a cold snare. Resected and retrieved. - One 5 mm polyp in the sigmoid colon, removed with a cold snare. Resected and retrieved. - Erythematous mucosa in the rectum. Biopsied. DIAGNOSIS:  A. TERMINAL ILEUM; COLD BIOPSY:  - ILEAL MUCOSA WITH NO SIGNIFICANT PATHOLOGIC ALTERATION.  - NEGATIVE FOR ACTIVE INFLAMMATION AND FEATURES OF CHRONICITY.  - NEGATIVE FOR DYSPLASIA AND MALIGNANCY.   B. ILEOCECAL VALVE POLYP; HOT SNARE:  - TUBULAR ADENOMA.  - NEGATIVE FOR HIGH-GRADE DYSPLASIA AND MALIGNANCY.   C. COLON POLYP, ASCENDING; COLD BIOPSY:  - TUBULAR ADENOMA.  - MELANOSIS COLI.  - NEGATIVE FOR HIGH-GRADE DYSPLASIA AND MALIGNANCY.   D. COLON POLYP X5, TRANSVERSE; COLD SNARE:  - TUBULAR ADENOMA, MULTIPLE FRAGMENTS.  - NEGATIVE FOR HIGH-GRADE DYSPLASIA AND MALIGNANCY.   E. COLON POLYP X5, DESCENDING; COLD SNARE:  - TUBULAR ADENOMA, MULTIPLE FRAGMENTS.  -  NEGATIVE FOR HIGH-GRADE DYSPLASIA AND MALIGNANCY.   F. COLON POLYP, SIGMOID; COLD SNARE:  - TUBULAR ADENOMA.  - NEGATIVE FOR HIGH-GRADE DYSPLASIA AND MALIGNANCY.   G. RECTUM; COLD BIOPSY:  - COLORECTAL MUCOSA WITH CONGESTION OF THE LAMINA PROPRIA.  - MELANOSIS COLI.  - NEGATIVE FOR ACTIVE INFLAMMATION AND FEATURES OF CHRONICITY.  - NEGATIVE FOR MICROSCOPIC COLITIS, DYSPLASIA, AND  MALIGNANCY.    He denies family history of GI malignancy First cousin with Crohn's disease  Past Medical History:  Diagnosis Date  . Acute metabolic encephalopathy 03/22/5373  . Anemia   . Crohn disease ([place])   . Diabetes mellitus without complication ([place])   . DVT of lower extremity (deep venous thrombosis) ([place]) 2016  . Empyema ([place]) 05/20/2017  . Encephalopathy 12/04/2017  . Fall at home, initial encounter 09/12/2020  . Hidradenitis suppurativa   . Hypertension   . ICH (intracerebral hemorrhage) ([place])   . Peritonitis ([place]) 04/21/2017  . Pyogenic arthritis of knee ([place]) 02/04/2016  . Renal disorder   . Sepsis ([place]) 01/12/2018  . Stroke [facility])     Past Surgical History:  Procedure Laterality Date  . ABDOMINAL SURGERY    . AMPUTATION FINGER Left 06/2019   [facility]  . ANGIOPLASTY Left    left fem-pop at [facility] 04-11-2018  . BELOW KNEE LEG AMPUTATION Right 08/2017   [facility]  . COLONOSCOPY    . COLONOSCOPY WITH PROPOFOL N/A 10/28/2020   Procedure: COLONOSCOPY WITH PROPOFOL;  Surgeon: Lin Landsman, MD;  Location[facility] ENDOSCOPY;  Service: Gastroenterology;  Laterality: N/A;  . COLONOSCOPY WITH PROPOFOL N/A 11/21/2020   Procedure: COLONOSCOPY WITH PROPOFOL;  Surgeon: [name], MD;  Location: [facility] ENDOSCOPY;  Service: Endoscopy;  Laterality: N/A;  . DIALYSIS/PERMA CATHETER INSERTION N/A 12/09/2017   Procedure: DIALYSIS/PERMA CATHETER INSERTION;  Surgeon: Katha Cabal, MD;  Location: [place] CV LAB;  Service: Cardiovascular;  Laterality: N/A;  . DIALYSIS/PERMA CATHETER INSERTION N/A 12/12/2017   Procedure: DIALYSIS/PERMA CATHETER INSERTION;  Surgeon: Algernon Huxley, MD;  Location: [place] CV LAB;  Service: Cardiovascular;  Laterality: N/A;  . DIALYSIS/PERMA CATHETER REMOVAL Left 12/09/2017   Procedure: DIALYSIS/PERMA CATHETER REMOVAL;  Surgeon: Katha Cabal, MD;  Location: [place] CV LAB;  Service: Cardiovascular;  Laterality:  Left;  . ESOPHAGOGASTRODUODENOSCOPY (EGD) WITH PROPOFOL N/A 11/20/2020   Procedure: ESOPHAGOGASTRODUODENOSCOPY (EGD) WITH PROPOFOL;  Surgeon: [name], MD;  Location: [facility] ENDOSCOPY;  Service: Endoscopy;  Laterality: N/A;  . KNEE SURGERY Left 02/04/2016   [facility]  . LEG AMPUTATION THROUGH LOWER TIBIA AND FIBULA Left 06/22/2018   [facility]  . LOWER EXTREMITY ANGIOGRAPHY Right 08/08/2017   Procedure: Lower Extremity Angiography;  Surgeon: Algernon Huxley, MD;  Location: [place] CV LAB;  Service: Cardiovascular;  Laterality: Right;  . LOWER EXTREMITY ANGIOGRAPHY Right 08/22/2017   Procedure: Lower Extremity Angiography;  Surgeon: Algernon Huxley, MD;  Location: [place] CV LAB;  Service: Cardiovascular;  Laterality: Right;  . LOWER EXTREMITY INTERVENTION  08/08/2017   Procedure: LOWER EXTREMITY INTERVENTION;  Surgeon: Algernon Huxley, MD;  Location: [place] CV LAB;  Service: Cardiovascular;;  . LOWER EXTREMITY INTERVENTION  08/22/2017   Procedure: LOWER EXTREMITY INTERVENTION;  Surgeon: Algernon Huxley, MD;  Location: [place] CV LAB;  Service: Cardiovascular;;     Current Outpatient Medications:  .  acetaminophen (TYLENOL) 500 MG tablet, Take 1,000 mg by mouth daily as needed for moderate pain or headache. , Disp: , Rfl:  .  Adalimumab ([name] PEN)  40 MG/0.4ML PNKT, Inject 40 mg into the muscle once a week. , Disp: , Rfl:  .  Alcohol Swabs PADS, Use as directed to check blood sugar three times daily for insulin dependent type 2 diabetes., Disp: 100 each, Rfl: 12 .  amLODipine (NORVASC) 10 MG tablet, TAKE 1 TABLET(10 MG) BY MOUTH DAILY AS NEEDED (Patient taking differently: Take 10 mg by mouth daily.), Disp: 90 tablet, Rfl: 0 .  amoxicillin-clavulanate (AUGMENTIN) 875-125 MG tablet, Take twice daily as needed for 2 weeks for flares, Disp: , Rfl:  .  atorvastatin (LIPITOR) 80 MG tablet, TAKE 1 TABLET(80 MG) BY MOUTH DAILY (Patient taking differently: Take 80 mg by mouth daily.), Disp: 90 tablet,  Rfl: 3 .  AURYXIA 1 GM 210 MG(Fe) tablet, Take 420 mg by mouth in the morning and at bedtime. , Disp: , Rfl:  .  Blood Glucose Monitoring Suppl (ONE TOUCH ULTRA 2) w/Device KIT, Use as directed to check blood sugar three times daily. E11.9, Disp: 1 each, Rfl: 0 .  carvedilol (COREG) 25 MG tablet, Take 1 tablet (25 mg total) by mouth 2 (two) times daily., Disp: 60 tablet, Rfl: 12 .  cyanocobalamin (,VITAMIN B-12,) 1000 MCG/ML injection, Inject 28m once a week for 4 weeks and then once a month for 3 months, Disp: 7 mL, Rfl: 0 .  furosemide (LASIX) 80 MG tablet, TAKE 1 TABLET(80 MG) BY MOUTH TWICE DAILY (Patient taking differently: Take 80 mg by mouth 2 (two) times daily.), Disp: 180 tablet, Rfl: 0 .  gabapentin (NEURONTIN) 100 MG capsule, Take 100 mg by mouth at bedtime. Take along with 303mcapsule at bedtime, Disp: , Rfl:  .  gabapentin (NEURONTIN) 300 MG capsule, Take 1 capsule (300 mg total) by mouth at bedtime., Disp: 90 capsule, Rfl: 1 .  hydrALAZINE (APRESOLINE) 50 MG tablet, Take 1 tablet (50 mg total) by mouth every 8 (eight) hours., Disp: 90 tablet, Rfl: 5 .  levETIRAcetam (KEPPRA) 500 MG tablet, Take 1 tablet (500 mg total) by mouth daily AND 1 tablet (500 mg total) every Monday, Wednesday, and Friday. After dialysis., Disp: 110 tablet, Rfl: 1 .  lisinopril (ZESTRIL) 40 MG tablet, , Disp: , Rfl:  .  sevelamer carbonate (RENVELA) 800 MG tablet, TAKE 1 TABLET(800 MG) BY MOUTH THREE TIMES DAILY, Disp: 270 tablet, Rfl: 0 .  spironolactone (ALDACTONE) 25 MG tablet, Take 1 tablet by mouth daily., Disp: , Rfl:    Family History  Problem Relation Age of Onset  . Irritable bowel syndrome Sister   . Diabetes Sister   . Heart disease Mother   . Diabetes Mother   . Heart disease Father   . Rheumatic fever Father        as child  . Psoriasis Brother   . Arthritis Brother   . Diabetes Sister   . Diabetes Sister      Social History   Tobacco Use  . Smoking status: Former SmResearch scientist (life sciences).  Smokeless tobacco: Never Used  . Tobacco comment: smokes marijuana  Vaping Use  . Vaping Use: Never used  Substance Use Topics  . Alcohol use: No  . Drug use: Yes    Types: Marijuana    Allergies as of 01/27/2021 - Review Complete 01/27/2021  Allergen Reaction Noted  . Methotrexate Other (See Comments) 11/06/2015  . Vancomycin Shortness Of Breath 11/06/2015  . Cefepime Other (See Comments) 09/04/2017  . Tape  08/25/2015    Review of Systems:    All systems reviewed and  negative except where noted in HPI.   Physical Exam:  BP (!) 206/76 (BP Location: Left Arm, Patient Position: Sitting, Cuff Size: Normal)   Pulse 71   Temp 98.4 F (36.9 C) (Oral)   Ht 5' 11"  (1.803 m)   Wt 147 lb (66.7 kg)   BMI 20.50 kg/m  No LMP for male patient.  General:   Alert, thin built, pleasant and cooperative in NAD Head:  Normocephalic and atraumatic. Eyes:  Sclera clear, no icterus.   Conjunctiva pale. Ears:  Normal auditory acuity. Nose:  No deformity, discharge, or lesions. Mouth:  No deformity or lesions,oropharynx pink & moist. Neck:  Supple; no masses or thyromegaly. Lungs:  Respirations even and unlabored.  Clear throughout to auscultation.   No wheezes, crackles, or rhonchi. No acute distress. Heart:  Regular rate and rhythm; no murmurs, clicks, rubs, or gallops. Abdomen:  Normal bowel sounds. Soft, non-tender and non-distended without masses, hepatosplenomegaly or hernias noted.  No guarding or rebound tenderness.   Rectal: Not performed Msk: Bilateral below-knee amputation with prosthesis in place Pulses:  Normal pulses noted. Extremities:  No clubbing or edema.  No cyanosis. Neurologic:  Alert and oriented x3; left upper extremity weakness Skin:  Intact without significant lesions or rashes. No jaundice. Lymph Nodes:  No significant cervical adenopathy. Psych:  Alert and cooperative. Normal mood and affect.  Imaging Studies: No recent abdominal imaging  Assessment and Plan:    Taksh B Higley is a 60 y.o. African-American male with history of diabetes on insulin complicated by end-stage renal disease on hemodialysis, cerebrovascular accident with residual left-sided weakness, peripheral artery disease, bilateral below-knee amputation with prosthesis, hidradenitis suppurativa currently maintained on weekly [name].  Patient does not have any evidence of inflammatory bowel disease  Normocytic anemia secondary to anemia of chronic kidney disease EGD was unremarkable, colonoscopy revealed multiple subcentimeter polyps that were removed Iron panel is consistent with anemia of chronic disease Normal B12 and folate levels Do not recommend any further GI work-up at this time.  If patient demonstrate any signs or symptoms to suggest GI bleed, recommend video capsule endoscopy at that time    Follow up as needed   Cephas Darby, MD

## 2021-02-03 ENCOUNTER — Telehealth (INDEPENDENT_AMBULATORY_CARE_PROVIDER_SITE_OTHER): Payer: Self-pay

## 2021-02-03 NOTE — Telephone Encounter (Signed)
I attempted to contact the patient's caregiver [name] and a message was left for a return call.

## 2021-02-06 ENCOUNTER — Inpatient Hospital Stay
Admission: EM | Admit: 2021-02-06 | Discharge: 2021-02-10 | DRG: 871 | Disposition: A | Discharge status: Home / Self Care | Payer: Medicare Other | Attending: Internal Medicine | Admitting: Internal Medicine

## 2021-02-06 ENCOUNTER — Emergency Department: Payer: Medicare Other

## 2021-02-06 ENCOUNTER — Other Ambulatory Visit: Payer: Self-pay

## 2021-02-06 DIAGNOSIS — Z8261 Family history of arthritis: Secondary | ICD-10-CM

## 2021-02-06 DIAGNOSIS — E11649 Type 2 diabetes mellitus with hypoglycemia without coma: Secondary | ICD-10-CM

## 2021-02-06 DIAGNOSIS — F0150 Vascular dementia without behavioral disturbance: Secondary | ICD-10-CM | POA: Diagnosis present

## 2021-02-06 DIAGNOSIS — E871 Hypo-osmolality and hyponatremia: Secondary | ICD-10-CM | POA: Diagnosis present

## 2021-02-06 DIAGNOSIS — L988 Other specified disorders of the skin and subcutaneous tissue: Secondary | ICD-10-CM

## 2021-02-06 DIAGNOSIS — I5042 Chronic combined systolic (congestive) and diastolic (congestive) heart failure: Secondary | ICD-10-CM | POA: Diagnosis present

## 2021-02-06 DIAGNOSIS — Z79899 Other long term (current) drug therapy: Secondary | ICD-10-CM | POA: Diagnosis not present

## 2021-02-06 DIAGNOSIS — E1122 Type 2 diabetes mellitus with diabetic chronic kidney disease: Secondary | ICD-10-CM | POA: Diagnosis present

## 2021-02-06 DIAGNOSIS — K5090 Crohn's disease, unspecified, without complications: Secondary | ICD-10-CM

## 2021-02-06 DIAGNOSIS — Z794 Long term (current) use of insulin: Secondary | ICD-10-CM

## 2021-02-06 DIAGNOSIS — Z8249 Family history of ischemic heart disease and other diseases of the circulatory system: Secondary | ICD-10-CM

## 2021-02-06 DIAGNOSIS — Z992 Dependence on renal dialysis: Secondary | ICD-10-CM | POA: Diagnosis not present

## 2021-02-06 DIAGNOSIS — G9341 Metabolic encephalopathy: Secondary | ICD-10-CM | POA: Diagnosis not present

## 2021-02-06 DIAGNOSIS — I132 Hypertensive heart and chronic kidney disease with heart failure and with stage 5 chronic kidney disease, or end stage renal disease: Secondary | ICD-10-CM | POA: Diagnosis present

## 2021-02-06 DIAGNOSIS — Z881 Allergy status to other antibiotic agents status: Secondary | ICD-10-CM

## 2021-02-06 DIAGNOSIS — Z89511 Acquired absence of right leg below knee: Secondary | ICD-10-CM | POA: Diagnosis not present

## 2021-02-06 DIAGNOSIS — L0231 Cutaneous abscess of buttock: Secondary | ICD-10-CM

## 2021-02-06 DIAGNOSIS — R4189 Other symptoms and signs involving cognitive functions and awareness: Secondary | ICD-10-CM | POA: Diagnosis present

## 2021-02-06 DIAGNOSIS — Z89512 Acquired absence of left leg below knee: Secondary | ICD-10-CM | POA: Diagnosis not present

## 2021-02-06 DIAGNOSIS — I160 Hypertensive urgency: Secondary | ICD-10-CM | POA: Diagnosis not present

## 2021-02-06 DIAGNOSIS — R404 Transient alteration of awareness: Secondary | ICD-10-CM | POA: Diagnosis not present

## 2021-02-06 DIAGNOSIS — Z89022 Acquired absence of left finger(s): Secondary | ICD-10-CM

## 2021-02-06 DIAGNOSIS — G40909 Epilepsy, unspecified, not intractable, without status epilepticus: Secondary | ICD-10-CM | POA: Diagnosis present

## 2021-02-06 DIAGNOSIS — R630 Anorexia: Secondary | ICD-10-CM | POA: Diagnosis present

## 2021-02-06 DIAGNOSIS — Z7960 Long term (current) use of unspecified immunomodulators and immunosuppressants: Secondary | ICD-10-CM

## 2021-02-06 DIAGNOSIS — N189 Chronic kidney disease, unspecified: Secondary | ICD-10-CM | POA: Diagnosis present

## 2021-02-06 DIAGNOSIS — D84821 Immunodeficiency due to drugs: Secondary | ICD-10-CM | POA: Diagnosis present

## 2021-02-06 DIAGNOSIS — I5022 Chronic systolic (congestive) heart failure: Secondary | ICD-10-CM | POA: Diagnosis present

## 2021-02-06 DIAGNOSIS — Z20822 Contact with and (suspected) exposure to covid-19: Secondary | ICD-10-CM | POA: Diagnosis present

## 2021-02-06 DIAGNOSIS — D631 Anemia in chronic kidney disease: Secondary | ICD-10-CM | POA: Diagnosis present

## 2021-02-06 DIAGNOSIS — L732 Hidradenitis suppurativa: Secondary | ICD-10-CM | POA: Diagnosis not present

## 2021-02-06 DIAGNOSIS — N2581 Secondary hyperparathyroidism of renal origin: Secondary | ICD-10-CM | POA: Diagnosis present

## 2021-02-06 DIAGNOSIS — E8889 Other specified metabolic disorders: Secondary | ICD-10-CM | POA: Diagnosis present

## 2021-02-06 DIAGNOSIS — Z86718 Personal history of other venous thrombosis and embolism: Secondary | ICD-10-CM

## 2021-02-06 DIAGNOSIS — N186 End stage renal disease: Secondary | ICD-10-CM | POA: Diagnosis not present

## 2021-02-06 DIAGNOSIS — E876 Hypokalemia: Secondary | ICD-10-CM | POA: Diagnosis not present

## 2021-02-06 DIAGNOSIS — Z87891 Personal history of nicotine dependence: Secondary | ICD-10-CM

## 2021-02-06 DIAGNOSIS — I5043 Acute on chronic combined systolic (congestive) and diastolic (congestive) heart failure: Secondary | ICD-10-CM | POA: Diagnosis present

## 2021-02-06 DIAGNOSIS — Z888 Allergy status to other drugs, medicaments and biological substances status: Secondary | ICD-10-CM

## 2021-02-06 DIAGNOSIS — I69398 Other sequelae of cerebral infarction: Secondary | ICD-10-CM

## 2021-02-06 DIAGNOSIS — E1129 Type 2 diabetes mellitus with other diabetic kidney complication: Secondary | ICD-10-CM

## 2021-02-06 DIAGNOSIS — Z8673 Personal history of transient ischemic attack (TIA), and cerebral infarction without residual deficits: Secondary | ICD-10-CM | POA: Diagnosis not present

## 2021-02-06 DIAGNOSIS — Z8719 Personal history of other diseases of the digestive system: Secondary | ICD-10-CM

## 2021-02-06 DIAGNOSIS — A419 Sepsis, unspecified organism: Secondary | ICD-10-CM | POA: Diagnosis not present

## 2021-02-06 DIAGNOSIS — Z833 Family history of diabetes mellitus: Secondary | ICD-10-CM

## 2021-02-06 DIAGNOSIS — L03317 Cellulitis of buttock: Secondary | ICD-10-CM | POA: Diagnosis present

## 2021-02-06 DIAGNOSIS — I10 Essential (primary) hypertension: Secondary | ICD-10-CM | POA: Diagnosis present

## 2021-02-06 DIAGNOSIS — R4182 Altered mental status, unspecified: Secondary | ICD-10-CM | POA: Diagnosis not present

## 2021-02-06 DIAGNOSIS — B9689 Other specified bacterial agents as the cause of diseases classified elsewhere: Secondary | ICD-10-CM | POA: Diagnosis not present

## 2021-02-06 LAB — CBC WITH DIFFERENTIAL/PLATELET
Abs Immature Granulocytes: 0.26 10*3/uL — ABNORMAL HIGH (ref 0.00–0.07)
Basophils Absolute: 0.1 10*3/uL (ref 0.0–0.1)
Basophils Relative: 0 %
Eosinophils Absolute: 0.1 10*3/uL (ref 0.0–0.5)
Eosinophils Relative: 0 %
HCT: 29.7 % — ABNORMAL LOW (ref 39.0–52.0)
Hemoglobin: 9.2 g/dL — ABNORMAL LOW (ref 13.0–17.0)
Immature Granulocytes: 1 %
Lymphocytes Relative: 3 %
Lymphs Abs: 0.5 10*3/uL — ABNORMAL LOW (ref 0.7–4.0)
MCH: 26.1 pg (ref 26.0–34.0)
MCHC: 31 g/dL (ref 30.0–36.0)
MCV: 84.4 fL (ref 80.0–100.0)
Monocytes Absolute: 0.6 10*3/uL (ref 0.1–1.0)
Monocytes Relative: 3 %
Neutro Abs: 16.9 10*3/uL — ABNORMAL HIGH (ref 1.7–7.7)
Neutrophils Relative %: 93 %
Platelets: 131 10*3/uL — ABNORMAL LOW (ref 150–400)
RBC: 3.52 MIL/uL — ABNORMAL LOW (ref 4.22–5.81)
RDW: 21.2 % — ABNORMAL HIGH (ref 11.5–15.5)
Smear Review: NORMAL
WBC: 18.4 10*3/uL — ABNORMAL HIGH (ref 4.0–10.5)
nRBC: 0 % (ref 0.0–0.2)

## 2021-02-06 LAB — URINALYSIS, COMPLETE (UACMP) WITH MICROSCOPIC
Bilirubin Urine: NEGATIVE
Glucose, UA: 150 mg/dL — AB
Ketones, ur: NEGATIVE mg/dL
Leukocytes,Ua: NEGATIVE
Nitrite: NEGATIVE
Protein, ur: >=300 mg/dL — AB
RBC / HPF: >50 RBC/hpf — ABNORMAL HIGH (ref 0–5)
Specific Gravity, Urine: 1.024 (ref 1.005–1.030)
pH: 7 (ref 5.0–8.0)

## 2021-02-06 LAB — COMPREHENSIVE METABOLIC PANEL
ALT: 8 U/L (ref 0–44)
AST: 13 U/L — ABNORMAL LOW (ref 15–41)
Albumin: 3.4 g/dL — ABNORMAL LOW (ref 3.5–5.0)
Alkaline Phosphatase: 64 U/L (ref 38–126)
Anion gap: 13 (ref 5–15)
BUN: 19 mg/dL (ref 6–20)
CO2: 29 mmol/L (ref 22–32)
Calcium: 8.7 mg/dL — ABNORMAL LOW (ref 8.9–10.3)
Chloride: 97 mmol/L — ABNORMAL LOW (ref 98–111)
Creatinine, Ser: 3.76 mg/dL — ABNORMAL HIGH (ref 0.61–1.24)
GFR, Estimated: 18 mL/min — ABNORMAL LOW (ref >60)
Glucose, Bld: 260 mg/dL — ABNORMAL HIGH (ref 70–99)
Potassium: 3 mmol/L — ABNORMAL LOW (ref 3.5–5.1)
Sodium: 139 mmol/L (ref 135–145)
Total Bilirubin: 0.9 mg/dL (ref 0.3–1.2)
Total Protein: 8.2 g/dL — ABNORMAL HIGH (ref 6.5–8.1)

## 2021-02-06 LAB — LACTIC ACID, PLASMA: Lactic Acid, Venous: 1.4 mmol/L (ref 0.5–1.9)

## 2021-02-06 LAB — PROTIME-INR
INR: 1.1 (ref 0.8–1.2)
Prothrombin Time: 14.3 seconds (ref 11.4–15.2)

## 2021-02-06 LAB — RESP PANEL BY RT-PCR (FLU A&B, COVID) ARPGX2
Influenza A by PCR: NEGATIVE
Influenza B by PCR: NEGATIVE
SARS Coronavirus 2 by RT PCR: NEGATIVE

## 2021-02-06 LAB — GLUCOSE, CAPILLARY: Glucose-Capillary: 256 mg/dL — ABNORMAL HIGH (ref 70–99)

## 2021-02-06 MED ORDER — KETOROLAC TROMETHAMINE 30 MG/ML IJ SOLN
30.0000 mg | Freq: Four times a day (QID) | INTRAMUSCULAR | Status: DC | PRN
  Administered 2021-02-08 – 2021-02-09 (×2): 30 mg via INTRAVENOUS
  Filled 2021-02-06 (×2): qty 1

## 2021-02-06 MED ORDER — ACETAMINOPHEN 325 MG PO TABS: 650.0000 mg | ORAL_TABLET | Freq: Four times a day (QID) | ORAL | Status: DC | PRN

## 2021-02-06 MED ORDER — LEVETIRACETAM 500 MG PO TABS
500.0000 mg | ORAL_TABLET | Freq: Two times a day (BID) | ORAL | Status: DC
  Administered 2021-02-07 – 2021-02-08 (×5): 500 mg via ORAL
  Filled 2021-02-06 (×5): qty 1

## 2021-02-06 MED ORDER — SODIUM CHLORIDE 0.9 % IV SOLN: INTRAVENOUS | Status: DC

## 2021-02-06 MED ORDER — INSULIN ASPART 100 UNIT/ML ~~LOC~~ SOLN
0.0000 [IU] | Freq: Three times a day (TID) | SUBCUTANEOUS | Status: DC
  Administered 2021-02-07: 3 [IU] via SUBCUTANEOUS
  Administered 2021-02-07 (×2): 1 [IU] via SUBCUTANEOUS
  Administered 2021-02-08 (×2): 2 [IU] via SUBCUTANEOUS
  Administered 2021-02-09 – 2021-02-10 (×3): 1 [IU] via SUBCUTANEOUS
  Filled 2021-02-06 (×9): qty 1

## 2021-02-06 MED ORDER — ACETAMINOPHEN 650 MG RE SUPP: 650.0000 mg | Freq: Four times a day (QID) | RECTAL | Status: DC | PRN

## 2021-02-06 MED ORDER — INSULIN ASPART 100 UNIT/ML ~~LOC~~ SOLN
0.0000 [IU] | Freq: Every day | SUBCUTANEOUS | Status: DC
  Administered 2021-02-07: 3 [IU] via SUBCUTANEOUS
  Filled 2021-02-06: qty 1

## 2021-02-06 MED ORDER — LINEZOLID 600 MG/300ML IV SOLN
600.0000 mg | Freq: Two times a day (BID) | INTRAVENOUS | Status: DC
  Administered 2021-02-07 – 2021-02-10 (×7): 600 mg via INTRAVENOUS
  Filled 2021-02-06 (×9): qty 300

## 2021-02-06 MED ORDER — ONDANSETRON HCL 4 MG/2ML IJ SOLN: 4.0000 mg | Freq: Once | INTRAMUSCULAR | Status: DC

## 2021-02-06 MED ORDER — HYDROMORPHONE HCL 1 MG/ML IJ SOLN
0.5000 mg | Freq: Once | INTRAMUSCULAR | Status: DC
Start: 2021-02-06 — End: 2021-02-10

## 2021-02-06 MED ORDER — SODIUM CHLORIDE 0.9 % IV SOLN
2.0000 g | INTRAVENOUS | Status: DC
  Administered 2021-02-07 – 2021-02-09 (×4): 2 g via INTRAVENOUS
  Filled 2021-02-06 (×4): qty 2
  Filled 2021-02-06: qty 20

## 2021-02-06 MED ORDER — HEPARIN SODIUM (PORCINE) 5000 UNIT/ML IJ SOLN
5000.0000 [IU] | Freq: Three times a day (TID) | INTRAMUSCULAR | Status: DC
  Administered 2021-02-07 – 2021-02-10 (×11): 5000 [IU] via SUBCUTANEOUS
  Filled 2021-02-06 (×11): qty 1

## 2021-02-06 MED ORDER — LACTATED RINGERS IV BOLUS (SEPSIS)
500.0000 mL | Freq: Once | INTRAVENOUS | Status: AC
  Administered 2021-02-07: 500 mL via INTRAVENOUS

## 2021-02-06 MED ORDER — HYDROCODONE-ACETAMINOPHEN 5-325 MG PO TABS: 1.0000 | ORAL_TABLET | ORAL | Status: DC | PRN

## 2021-02-06 MED ORDER — ONDANSETRON HCL 4 MG/2ML IJ SOLN
4.0000 mg | Freq: Four times a day (QID) | INTRAMUSCULAR | Status: DC | PRN
  Administered 2021-02-07: 4 mg via INTRAVENOUS
  Filled 2021-02-06: qty 2

## 2021-02-06 MED ORDER — ONDANSETRON HCL 4 MG PO TABS
4.0000 mg | ORAL_TABLET | Freq: Four times a day (QID) | ORAL | Status: DC | PRN
Start: 2021-02-06 — End: 2021-02-10

## 2021-02-06 MED ORDER — PIPERACILLIN-TAZOBACTAM 3.375 G IVPB 30 MIN
3.3750 g | Freq: Once | INTRAVENOUS | Status: AC
  Administered 2021-02-06: 3.375 g via INTRAVENOUS
  Filled 2021-02-06: qty 50

## 2021-02-06 MED ORDER — ACETAMINOPHEN 500 MG PO TABS
1000.0000 mg | ORAL_TABLET | Freq: Once | ORAL | Status: AC
  Administered 2021-02-06: 1000 mg via ORAL
  Filled 2021-02-06: qty 2

## 2021-02-06 NOTE — ED Provider Notes (Signed)
[facility] Emergency Department Provider Note  ____________________________________________   Event Date/Time   First MD Initiated Contact with Patient 02/06/21 1737     (approximate)  I have reviewed the triage vital signs and the nursing notes.   HISTORY  Chief Complaint Altered Mental Status    HPI [name] is a 60 y.o. male    here with fever and altered mental status.  The patient reportedly was in his usual state of health earlier today.  He currently has an abscess and drainage along his left buttocks but has a history of hidradenitis with [name] use and chronic infections.  He states that after dialysis, he began to feel like he was very confused.  The patient states that since then, he has had decreasing confusion, and now feels better.  He does endorse aching, throbbing, left buttocks pain.  He has had persistent drainage.  No cough or shortness of breath.  No nausea, vomiting, diarrhea.     Past Medical History:  Diagnosis Date  . Acute metabolic encephalopathy 7/0/2637  . Anemia   . Crohn disease ([place])   . Diabetes mellitus without complication ([place])   . DVT of lower extremity (deep venous thrombosis) ([place]) 2016  . Empyema ([place]) 05/20/2017  . Encephalopathy 12/04/2017  . Fall at home, initial encounter 09/12/2020  . Hidradenitis suppurativa   . Hypertension   . ICH (intracerebral hemorrhage) ([place])   . Peritonitis ([place]) 04/21/2017  . Pyogenic arthritis of knee ([place]) 02/04/2016  . Renal disorder   . Sepsis ([place]) 01/12/2018  . Stroke [facility])     Patient Active Problem List   Diagnosis Date Noted  . History of GI bleed 02/06/2021  . Long term current use of immunosuppressive drug 02/06/2021  . Disorder of skin due to Crohn's disease ([place]) 02/06/2021  . Complication of vascular access for dialysis 01/22/2021  . Polyp of transverse colon   . Acute metabolic encephalopathy 85/88/5027  . Anemia in ESRD (end-stage renal disease) ([place])  11/18/2020  . Chronic systolic CHF (congestive heart failure) ([place]) 11/18/2020  . Cerebrovascular disease 09/12/2020  . Hyperkalemia 09/12/2020  . Mild cognitive impairment 09/30/2019  . Peripheral vascular disease ([place]) 06/28/2018  . Sepsis ([place]) 01/12/2018  . Pressure injury of skin 12/04/2017  . S/P bilateral BKA (below knee amputation) ([place]) 10/20/2017  . Atherosclerosis of native arteries of extremity with rest pain ([place]) 08/05/2017  . History of CVA (cerebrovascular accident) 04/15/2017  . Seizure disorder ([place]) 04/15/2017  . End stage renal failure on dialysis ([place]) 04/12/2016  . Aphthae 02/20/2016  . Hidradenitis suppurativa 02/20/2016  . Leg pain 02/20/2016  . Neuropathy 02/20/2016  . Narrowing of intervertebral disc space 08/29/2015  . Vascular disorder of lower extremity 08/29/2015  . Failure of erection 08/29/2015  . Hypercholesteremia 08/29/2015  . Hypertension 08/29/2015  . Anemia due to chronic kidney disease 05/26/2015  . Venous insufficiency of leg 09/04/2014  . History of deep vein thrombosis (DVT) of lower extremity 08/16/2014  . Prostatic intraepithelial neoplasia 11/02/2013  . Elevated prostate specific antigen (PSA) 09/11/2013  . Benign prostatic hyperplasia with urinary obstruction 08/13/2013  . Spermatocele 08/13/2013  . Avitaminosis D 01/25/2013  . Type 2 diabetes mellitus with kidney complication, with long-term current use of insulin ([place]) 03/28/2012  . Crohn disease ([place]) 08/03/2011    Past Surgical History:  Procedure Laterality Date  . ABDOMINAL SURGERY    . AMPUTATION FINGER Left 06/2019   [facility]  . ANGIOPLASTY  Left    [facility]pop [facility] 04-11-2018  . BELOW KNEE LEG AMPUTATION Right 08/2017   [facility]  . COLONOSCOPY    . COLONOSCOPY WITH PROPOFOL N/A 10/28/2020   Procedure: COLONOSCOPY WITH PROPOFOL;  Surgeon: Lin Landsman, MD;  Location: [facility] ENDOSCOPY;  Service: Gastroenterology;  Laterality: N/A;  .  COLONOSCOPY WITH PROPOFOL N/A 11/21/2020   Procedure: COLONOSCOPY WITH PROPOFOL;  Surgeon: [name], MD;  Location: [facility] ENDOSCOPY;  Service: Endoscopy;  Laterality: N/A;  . DIALYSIS/PERMA CATHETER INSERTION N/A 12/09/2017   Procedure: DIALYSIS/PERMA CATHETER INSERTION;  Surgeon: Katha Cabal, MD;  Location: [place] CV LAB;  Service: Cardiovascular;  Laterality: N/A;  . DIALYSIS/PERMA CATHETER INSERTION N/A 12/12/2017   Procedure: DIALYSIS/PERMA CATHETER INSERTION;  Surgeon: Algernon Huxley, MD;  Location: [place] CV LAB;  Service: Cardiovascular;  Laterality: N/A;  . DIALYSIS/PERMA CATHETER REMOVAL Left 12/09/2017   Procedure: DIALYSIS/PERMA CATHETER REMOVAL;  Surgeon: Katha Cabal, MD;  Location: [place] CV LAB;  Service: Cardiovascular;  Laterality: Left;  . ESOPHAGOGASTRODUODENOSCOPY (EGD) WITH PROPOFOL N/A 11/20/2020   Procedure: ESOPHAGOGASTRODUODENOSCOPY (EGD) WITH PROPOFOL;  Surgeon: [name], MD;  Location: [facility] ENDOSCOPY;  Service: Endoscopy;  Laterality: N/A;  . KNEE SURGERY Left 02/04/2016   [facility]  . LEG AMPUTATION THROUGH LOWER TIBIA AND FIBULA Left 06/22/2018   [facility]  . LOWER EXTREMITY ANGIOGRAPHY Right 08/08/2017   Procedure: Lower Extremity Angiography;  Surgeon: Algernon Huxley, MD;  Location: [place] CV LAB;  Service: Cardiovascular;  Laterality: Right;  . LOWER EXTREMITY ANGIOGRAPHY Right 08/22/2017   Procedure: Lower Extremity Angiography;  Surgeon: Algernon Huxley, MD;  Location: [place] CV LAB;  Service: Cardiovascular;  Laterality: Right;  . LOWER EXTREMITY INTERVENTION  08/08/2017   Procedure: LOWER EXTREMITY INTERVENTION;  Surgeon: Algernon Huxley, MD;  Location: [place] CV LAB;  Service: Cardiovascular;;  . LOWER EXTREMITY INTERVENTION  08/22/2017   Procedure: LOWER EXTREMITY INTERVENTION;  Surgeon: Algernon Huxley, MD;  Location: [place] CV LAB;  Service: Cardiovascular;;    Prior to Admission medications   Medication Sig Start Date  End Date Taking? Authorizing Provider  acetaminophen (TYLENOL) 500 MG tablet Take 1,000 mg by mouth daily as needed for moderate pain or headache.    Yes [provider]  Adalimumab ([name] PEN) 40 MG/0.4ML PNKT Inject 40 mg into the muscle once a week.  11/27/18  Yes [provider]  amLODipine (NORVASC) 10 MG tablet TAKE 1 TABLET(10 MG) BY MOUTH DAILY AS NEEDED Patient taking differently: Take 10 mg by mouth at bedtime. 11/11/20  Yes [name], MD  atorvastatin (LIPITOR) 80 MG tablet TAKE 1 TABLET(80 MG) BY MOUTH DAILY Patient taking differently: Take 80 mg by mouth daily. 06/25/20  Yes [name], MD  AURYXIA 1 GM 210 MG(Fe) tablet Take 420 mg by mouth in the morning and at bedtime.  10/23/18  Yes [provider]  carvedilol (COREG) 25 MG tablet Take 1 tablet (25 mg total) by mouth 2 (two) times daily. 06/27/20  Yes [name], MD  furosemide (LASIX) 80 MG tablet TAKE 1 TABLET(80 MG) BY MOUTH TWICE DAILY Patient taking differently: Take 80 mg by mouth 2 (two) times daily. 11/04/20  Yes [name], MD  gabapentin (NEURONTIN) 100 MG capsule Take 100 mg by mouth at bedtime. Take along with 31m capsule at bedtime   Yes [provider]  gabapentin (NEURONTIN) 300 MG capsule Take 1 capsule (300 mg total) by mouth at  bedtime. 09/14/20  Yes Mercy Riding, MD  hydrALAZINE (APRESOLINE) 50 MG tablet Take 1 tablet (50 mg total) by mouth every 8 (eight) hours. 01/20/21  Yes [name], MD  levETIRAcetam (KEPPRA) 500 MG tablet Take 1 tablet (500 mg total) by mouth daily AND 1 tablet (500 mg total) every Monday, Wednesday, and Friday. After dialysis. 09/14/20  Yes Mercy Riding, MD  lisinopril (ZESTRIL) 40 MG tablet Take 40 mg by mouth daily. 12/17/20  Yes [provider]  sevelamer carbonate (RENVELA) 800 MG tablet TAKE 1 TABLET(800 MG) BY MOUTH THREE TIMES DAILY Patient taking differently: Take 800 mg by mouth 3 (three) times daily with meals.  09/22/20  Yes [name], MD  spironolactone (ALDACTONE) 25 MG tablet Take 1 tablet by mouth at bedtime. 12/17/20 12/17/21 Yes [provider]  Alcohol Swabs PADS Use as directed to check blood sugar three times daily for insulin dependent type 2 diabetes. 10/20/17   [name], MD  amoxicillin-clavulanate (AUGMENTIN) 875-125 MG tablet Take twice daily as needed for 2 weeks for flares Patient not taking: No sig reported 01/06/21   [provider]  Blood Glucose Monitoring Suppl (ONE TOUCH ULTRA 2) w/Device KIT Use as directed to check blood sugar three times daily. E11.9 02/20/18   [name], MD  cyanocobalamin (,VITAMIN B-12,) 1000 MCG/ML injection Inject 48m once a week for 4 weeks and then once a month for 3 months 09/23/20   VLin Landsman MD    Allergies Methotrexate, Vancomycin, Cefepime, and Tape  Family History  Problem Relation Age of Onset  . Irritable bowel syndrome Sister   . Diabetes Sister   . Heart disease Mother   . Diabetes Mother   . Heart disease Father   . Rheumatic fever Father        as child  . Psoriasis Brother   . Arthritis Brother   . Diabetes Sister   . Diabetes Sister     Social History Social History   Tobacco Use  . Smoking status: Former SResearch scientist (life sciences) . Smokeless tobacco: Never Used  . Tobacco comment: smokes marijuana  Vaping Use  . Vaping Use: Never used  Substance Use Topics  . Alcohol use: No  . Drug use: Yes    Types: Marijuana    Review of Systems  Review of Systems  Constitutional: Positive for chills, fatigue and fever.  HENT: Negative for sore throat.   Respiratory: Negative for shortness of breath.   Cardiovascular: Negative for chest pain.  Gastrointestinal: Negative for abdominal pain.  Genitourinary: Negative for flank pain.  Musculoskeletal: Negative for neck pain.  Skin: Positive for wound. Negative for rash.  Allergic/Immunologic: Negative for immunocompromised state.  Neurological: Negative  for weakness and numbness.  Hematological: Does not bruise/bleed easily.  Psychiatric/Behavioral: Positive for confusion.  All other systems reviewed and are negative.    ____________________________________________  PHYSICAL EXAM:      VITAL SIGNS: ED Triage Vitals  Enc Vitals Group     BP 02/06/21 1736 (!) 183/85     Pulse Rate 02/06/21 1734 79     Resp 02/06/21 1734 15     Temp 02/06/21 1734 (!) 101.7 F (38.7 C)     Temp Source 02/06/21 1734 Oral     SpO2 02/06/21 1734 100 %     Weight 02/06/21 1733 149 lb 0.5 oz (67.6 kg)     Height 02/06/21 1733 5' 11"  (1.803 m)     Head Circumference --  Peak Flow --      Pain Score 02/06/21 1735 0     Pain Loc --      Pain Edu? --      Excl. in [place]? --      Physical Exam Vitals and nursing note reviewed.  Constitutional:      General: He is not in acute distress.    Appearance: He is well-developed.  HENT:     Head: Normocephalic and atraumatic.  Eyes:     Conjunctiva/sclera: Conjunctivae normal.  Cardiovascular:     Rate and Rhythm: Normal rate and regular rhythm.     Heart sounds: Normal heart sounds. No murmur heard. No friction rub.  Pulmonary:     Effort: Pulmonary effort is normal. No respiratory distress.     Breath sounds: Normal breath sounds. No wheezing or rales.  Abdominal:     General: There is no distension.     Palpations: Abdomen is soft.     Tenderness: There is no abdominal tenderness.  Musculoskeletal:     Cervical back: Neck supple.  Skin:    General: Skin is warm.     Capillary Refill: Capillary refill takes less than 2 seconds.     Comments: Moderate warmth and fluctuance to the left lateral buttocks, draining copious amounts of purulent, foul-smelling liquid from the lateral gluteal skin.  Neurological:     Mental Status: He is alert and oriented to person, place, and time.     Motor: No abnormal muscle tone.       ____________________________________________   LABS (all labs ordered  are listed, but only abnormal results are displayed)  Labs Reviewed  CBC WITH DIFFERENTIAL/PLATELET - Abnormal; Notable for the following components:      Result Value   WBC 18.4 (*)    RBC 3.52 (*)    Hemoglobin 9.2 (*)    HCT 29.7 (*)    RDW 21.2 (*)    Platelets 131 (*)    Neutro Abs 16.9 (*)    Lymphs Abs 0.5 (*)    Abs Immature Granulocytes 0.26 (*)    All other components within normal limits  COMPREHENSIVE METABOLIC PANEL - Abnormal; Notable for the following components:   Potassium 3.0 (*)    Chloride 97 (*)    Glucose, Bld 260 (*)    Creatinine, Ser 3.76 (*)    Calcium 8.7 (*)    Total Protein 8.2 (*)    Albumin 3.4 (*)    AST 13 (*)    GFR, Estimated 18 (*)    All other components within normal limits  URINALYSIS, COMPLETE (UACMP) WITH MICROSCOPIC - Abnormal; Notable for the following components:   Color, Urine YELLOW (*)    APPearance CLOUDY (*)    Glucose, UA 150 (*)    Hgb urine dipstick MODERATE (*)    Protein, ur >=300 (*)    RBC / HPF >50 (*)    Bacteria, UA RARE (*)    All other components within normal limits  GLUCOSE, CAPILLARY - Abnormal; Notable for the following components:   Glucose-Capillary 256 (*)    All other components within normal limits  RESP PANEL BY RT-PCR (FLU A&B, COVID) ARPGX2  CULTURE, BLOOD (SINGLE)  URINE CULTURE  LACTIC ACID, PLASMA  PROTIME-INR  PATHOLOGIST SMEAR REVIEW  HEMOGLOBIN A1C  PROTIME-INR  CORTISOL-AM, BLOOD  PROCALCITONIN  BASIC METABOLIC PANEL  CBC    ____________________________________________  EKG: Normal sinus rhythm, ventricular rate 79.  PR 150, QRS 97, QTc  469.  No acute ST elevations or depressions. ________________________________________  RADIOLOGY All imaging, including plain films, CT scans, and ultrasounds, independently reviewed by me, and interpretations confirmed via formal radiology reads.  ED MD interpretation:   Chest x-ray: Possible trace right pleural effusion CT Head:  NACA  Official radiology report(s): CT ABDOMEN PELVIS WO CONTRAST  Result Date: 02/06/2021 CLINICAL DATA:  Abdominal pain and fever since dialysis. EXAM: CT ABDOMEN AND PELVIS WITHOUT CONTRAST TECHNIQUE: Multidetector CT imaging of the abdomen and pelvis was performed following the standard protocol without IV contrast. COMPARISON:  11/18/2020 FINDINGS: Lower chest: Motion artifact limits the examination. Small right pleural effusion with probable right pleural thickening and calcification. No change since prior study. Mild interstitial changes in the lung bases may indicate edema. Cardiac enlargement. Hepatobiliary: No focal liver lesions identified on noncontrast imaging. Small stones in the gallbladder. No gallbladder wall thickening or edema. Bile ducts are not dilated. Pancreas: Unremarkable. No pancreatic ductal dilatation or surrounding inflammatory changes. Spleen: Spleen is mildly enlarged.  No focal lesions identified. Adrenals/Urinary Tract: No adrenal gland nodules. Kidneys are symmetrical. No hydronephrosis or stone. Bladder is decompressed. Stomach/Bowel: Stomach, small bowel, and colon are mostly decompressed. Scattered stool in the colon. No inflammatory changes appreciated. Appendix is segmentally identified and appears normal. Vascular/Lymphatic: Extensive vascular calcification throughout the abdomen and pelvis. Aortic calcification. No aortic aneurysm. Reproductive: Prostate gland is diffusely enlarged. Other: No free air or free fluid in the abdomen. Subcutaneous soft tissue gas in the upper abdomen likely represents injection sites. Musculoskeletal: Diffuse bone sclerosis likely representing renal osteodystrophy. Degenerative changes in the lumbar spine. IMPRESSION: 1. No acute process demonstrated in the abdomen or pelvis. No evidence of bowel obstruction or inflammation. 2. Cholelithiasis without evidence of cholecystitis. 3. Extensive vascular calcification throughout the abdomen and  pelvis. 4. Enlarged prostate gland. 5. Small right pleural effusion with probable right pleural thickening and calcification. No change since prior study. 6. Diffuse bone sclerosis likely representing renal osteodystrophy. Aortic Atherosclerosis (ICD10-I70.0). Electronically Signed   By: Lucienne Capers M.D.   On: 02/06/2021 21:23   CT Head Wo Contrast  Result Date: 02/06/2021 CLINICAL DATA:  Delirium and confusion. Possible seizure today. Dialysis today. EXAM: CT HEAD WITHOUT CONTRAST TECHNIQUE: Contiguous axial images were obtained from the base of the skull through the vertex without intravenous contrast. COMPARISON:  CT 11/18/2020.  MRI 11/21/2020 FINDINGS: Brain: Diffuse cerebral atrophy. Mild ventricular dilatation consistent with central atrophy. Low-attenuation changes in the deep white matter consistent small vessel ischemia. Old area of encephalomalacia in the right frontal lobe with associated calcifications, likely old infarct. Asymmetric low-attenuation in the left temporal lobe limited visualization of this area due to streak artifact but this appears to correspond to a cystic lesion with some surrounding white matter edema on the previous MRI. This could represent old infarct or a cystic mass lesion. Suggest follow-up MRI with contrast for further evaluation. No acute intracranial hemorrhage. Vascular: No hyperdense vessel or unexpected calcification. Skull: Calvarium appears intact. Sinuses/Orbits: Paranasal sinuses and mastoid air cells are clear. Other: Hyperdense soft tissue swelling over the posterior neck, likely soft tissue subcutaneous hematoma. IMPRESSION: 1. No acute intracranial abnormalities. 2. Chronic atrophy and small vessel ischemic changes. 3. Old area of encephalomalacia in the right frontal lobe with associated calcifications, likely old infarct. 4. Asymmetric low-attenuation in the left temporal lobe appears to correspond to a cystic lesion with some surrounding white matter  edema on the previous MRI. This could represent old infarct or a cystic  mass lesion. Suggest follow-up MRI with contrast for further evaluation. 5. Hyperdense soft tissue swelling over the posterior neck, likely soft tissue subcutaneous hematoma. Electronically Signed   By: Lucienne Capers M.D.   On: 02/06/2021 19:59   DG Chest Port 1 View  Result Date: 02/06/2021 CLINICAL DATA:  Possible sepsis EXAM: PORTABLE CHEST 1 VIEW COMPARISON:  11/18/2020 FINDINGS: Stable cardiomegaly. Atherosclerotic calcification of the aortic knob. Chronic coarsened interstitial markings. No vascular congestion or overt edema. No focal consolidation. Possible trace right pleural effusion. No pneumothorax. Chronic healed left-sided rib fractures. Unchanged vascular stents. IMPRESSION: Possible trace right pleural effusion. No focal consolidation. Electronically Signed   By: Davina Poke D.O.   On: 02/06/2021 18:37    ____________________________________________  PROCEDURES   Procedure(s) performed (including Critical Care):  .1-3 Lead EKG Interpretation Performed by: Duffy Bruce, MD Authorized by: Duffy Bruce, MD     Interpretation: normal     ECG rate:  60-80   ECG rate assessment: normal     Rhythm: sinus rhythm     Ectopy: none     Conduction: normal   Comments:     Indication: sepsis .Critical Care Performed by: Duffy Bruce, MD Authorized by: Duffy Bruce, MD   Critical care provider statement:    Critical care time (minutes):  35   Critical care time was exclusive of:  Separately billable procedures and treating other patients and teaching time   Critical care was necessary to treat or prevent imminent or life-threatening deterioration of the following conditions:  Cardiac failure, circulatory failure and respiratory failure   Critical care was time spent personally by me on the following activities:  Development of treatment plan with patient or surrogate, discussions with  consultants, evaluation of patient's response to treatment, examination of patient, obtaining history from patient or surrogate, ordering and performing treatments and interventions, ordering and review of laboratory studies, ordering and review of radiographic studies, pulse oximetry, re-evaluation of patient's condition and review of old charts   I assumed direction of critical care for this patient from another provider in my specialty: no      ____________________________________________  INITIAL IMPRESSION / MDM / [place] / ED COURSE  As part of my medical decision making, I reviewed the following data within the [place] notes reviewed and incorporated, Old chart reviewed, Notes from prior ED visits, and [place] Controlled Substance Database       *[name] was evaluated in Emergency Department on 02/07/2021 for the symptoms described in the history of present illness. He was evaluated in the context of the global COVID-19 pandemic, which necessitated consideration that the patient might be at risk for infection with the SARS-CoV-2 virus that causes COVID-19. Institutional protocols and algorithms that pertain to the evaluation of patients at risk for COVID-19 are in a state of rapid change based on information released by regulatory bodies including the CDC and federal and state organizations. These policies and algorithms were followed during the patient's care in the ED.  Some ED evaluations and interventions may be delayed as a result of limited staffing during the pandemic.*     Medical Decision Making:  60 yo M here with fever, altered mental status, now improved. Pt febrile on arrival but otherwise nontoxic, HDS. He is on [name] for hydradenitis, and appears to have an active infection of his left gluteal area which I suspect is his likely source. Broad-spectrum ABX started. Will be cautious with fluids given ESRD  status and normal LA. Labs show  significant leukocytosis. UA also with possible UTI. Lytes acceptable for ESRD status. LA normal. CT scan without apparent abscess or surgical abnormality. Mental status improving with fever control. No focal deficits and CT head is negative. Will admit for IV ABX.  ____________________________________________  FINAL CLINICAL IMPRESSION(S) / ED DIAGNOSES  Final diagnoses:  Transient alteration of awareness  Sepsis without acute organ dysfunction, due to unspecified organism [facility])     MEDICATIONS GIVEN DURING THIS VISIT:  Medications  HYDROmorphone (DILAUDID) injection 0.5 mg (0 mg Intravenous Not Given 02/06/21 2100)  levETIRAcetam (KEPPRA) tablet 500 mg (500 mg Oral Given 02/07/21 0021)  insulin aspart (novoLOG) injection 0-9 Units (has no administration in time range)  insulin aspart (novoLOG) injection 0-5 Units (3 Units Subcutaneous Given 02/07/21 0020)  heparin injection 5,000 Units (5,000 Units Subcutaneous Given 02/07/21 0020)  0.9 %  sodium chloride infusion ( Intravenous New Bag/Given 02/07/21 0010)  cefTRIAXone (ROCEPHIN) 2 g in sodium chloride 0.9 % 100 mL IVPB (has no administration in time range)  acetaminophen (TYLENOL) tablet 650 mg (has no administration in time range)    Or  acetaminophen (TYLENOL) suppository 650 mg (has no administration in time range)  HYDROcodone-acetaminophen (NORCO/VICODIN) 5-325 MG per tablet 1-2 tablet (has no administration in time range)  ketorolac (TORADOL) 30 MG/ML injection 30 mg (has no administration in time range)  ondansetron (ZOFRAN) tablet 4 mg (has no administration in time range)    Or  ondansetron (ZOFRAN) injection 4 mg (has no administration in time range)  linezolid (ZYVOX) IVPB 600 mg (has no administration in time range)  piperacillin-tazobactam (ZOSYN) IVPB 3.375 g (0 g Intravenous Stopped 02/06/21 2000)  acetaminophen (TYLENOL) tablet 1,000 mg (1,000 mg Oral Given 02/06/21 2041)  lactated ringers bolus 500 mL (500 mLs  Intravenous New Bag/Given 02/07/21 0024)     ED Discharge Orders    None       Note:  This document was prepared using Dragon voice recognition software and may include unintentional dictation errors.   Duffy Bruce, MD 02/07/21 [phone]

## 2021-02-06 NOTE — H&P (Addendum)
History and Physical    Tad Fancher Klann DEY:[id number] DOB: [birth year] DOA: 02/06/2021  PCP: [name], MD   Patient coming from: Home  I have personally briefly reviewed patient's old medical records in [place]  Chief Complaint: Altered mental status  HPI: [name] is a 60 y.o. male with medical history significant for DM, HTN, ESRD on HD MWF, seizure disorder, CVA, hydradenitis suppurativa and cutaneous Crohn's on [name] and as needed Augmentin for flareups, followed by [facility] dermatology, status post recent hospitalization for GI bleed, presenting with altered mental status/possible seizure, who was brought to the emergency room with altered mental status and confusion following dialysis earlier in the day.  By arrival in the emergency room, his mentation was more improved.  He denied chest pain, shortness of breath, fever or chills or cough.  Denied nausea vomiting abdominal pain or change in bowel habits.  Did endorse feeling confused after dialysis.  Does complain of ongoing aching and throbbing of a chronic wound of the left buttock related to his hidradenitis which has had a persistent drainage. ED course on arrival: Febrile at 101.7 with pulse 79, BP 183/85 respirations 15 with O2 sat 100% on room air.  Blood work significant for leukocytosis of 18,000, hemoglobin 9.2 which is his baseline.  Lactic acid normal at 1.4.  Blood sugar 260 and potassium 3.0 but other labs in keeping with dialysis status.  Urinalysis abnormal but with negative nitrite and leukocyte esterase and rare bacteria. EKG, reviewed and interpreted myself: Normal sinus rhythm at 79 with no acute ST-T wave changes Imaging: CT head with no acute findings CT abdomen and pelvis: No acute findings but multiple chronic stable findings-see detailed findings below Chest x-ray possible trace right pleural effusion.  No focal consolidation  Patient was started on antibiotics for sepsis due to cellulitis and  possible UTI.  Hospitalist consulted for admission.  Review of Systems: As per HPI otherwise all other systems on review of systems negative.    Past Medical History:  Diagnosis Date  . Acute metabolic encephalopathy 12/16/4968  . Anemia   . Crohn disease ([place])   . Diabetes mellitus without complication ([place])   . DVT of lower extremity (deep venous thrombosis) ([place]) 2016  . Empyema ([place]) 05/20/2017  . Encephalopathy 12/04/2017  . Fall at home, initial encounter 09/12/2020  . Hidradenitis suppurativa   . Hypertension   . ICH (intracerebral hemorrhage) ([place])   . Peritonitis ([place]) 04/21/2017  . Pyogenic arthritis of knee ([place]) 02/04/2016  . Renal disorder   . Sepsis ([place]) 01/12/2018  . Stroke [facility])     Past Surgical History:  Procedure Laterality Date  . ABDOMINAL SURGERY    . AMPUTATION FINGER Left 06/2019   [facility]  . ANGIOPLASTY Left    left fem[facility]pop at [facility] 04-11-2018  . BELOW KNEE LEG AMPUTATION Right 08/2017   [facility]  . COLONOSCOPY    . COLONOSCOPY WITH PROPOFOL N/A 10/28/2020   Procedure: COLONOSCOPY WITH PROPOFOL;  Surgeon: Lin Landsman, MD;  Location[facility] ENDOSCOPY;  Service: Gastroenterology;  Laterality: N/A;  . COLONOSCOPY WITH PROPOFOL N/A 11/21/2020   Procedure: COLONOSCOPY WITH PROPOFOL;  Surgeon: [name], MD;  Location: [facility] ENDOSCOPY;  Service: Endoscopy;  Laterality: N/A;  . DIALYSIS/PERMA CATHETER INSERTION N/A 12/09/2017   Procedure: DIALYSIS/PERMA CATHETER INSERTION;  Surgeon: Katha Cabal, MD;  Location: [place] CV LAB;  Service: Cardiovascular;  Laterality: N/A;  . DIALYSIS/PERMA CATHETER INSERTION N/A 12/12/2017  Procedure: DIALYSIS/PERMA CATHETER INSERTION;  Surgeon: Algernon Huxley, MD;  Location: [place] CV LAB;  Service: Cardiovascular;  Laterality: N/A;  . DIALYSIS/PERMA CATHETER REMOVAL Left 12/09/2017   Procedure: DIALYSIS/PERMA CATHETER REMOVAL;  Surgeon: Katha Cabal, MD;  Location: [place]  CV LAB;  Service: Cardiovascular;  Laterality: Left;  . ESOPHAGOGASTRODUODENOSCOPY (EGD) WITH PROPOFOL N/A 11/20/2020   Procedure: ESOPHAGOGASTRODUODENOSCOPY (EGD) WITH PROPOFOL;  Surgeon: [name], MD;  Location: [facility] ENDOSCOPY;  Service: Endoscopy;  Laterality: N/A;  . KNEE SURGERY Left 02/04/2016   [facility]  . LEG AMPUTATION THROUGH LOWER TIBIA AND FIBULA Left 06/22/2018   [facility]  . LOWER EXTREMITY ANGIOGRAPHY Right 08/08/2017   Procedure: Lower Extremity Angiography;  Surgeon: Algernon Huxley, MD;  Location: [place] CV LAB;  Service: Cardiovascular;  Laterality: Right;  . LOWER EXTREMITY ANGIOGRAPHY Right 08/22/2017   Procedure: Lower Extremity Angiography;  Surgeon: Algernon Huxley, MD;  Location: [place] CV LAB;  Service: Cardiovascular;  Laterality: Right;  . LOWER EXTREMITY INTERVENTION  08/08/2017   Procedure: LOWER EXTREMITY INTERVENTION;  Surgeon: Algernon Huxley, MD;  Location: [place] CV LAB;  Service: Cardiovascular;;  . LOWER EXTREMITY INTERVENTION  08/22/2017   Procedure: LOWER EXTREMITY INTERVENTION;  Surgeon: Algernon Huxley, MD;  Location: [place] CV LAB;  Service: Cardiovascular;;     reports that he has quit smoking. He has never used smokeless tobacco. He reports current drug use. Drug: Marijuana. He reports that he does not drink alcohol.  Allergies  Allergen Reactions  . Methotrexate Other (See Comments)    Blood count drops  . Vancomycin Shortness Of Breath    Eyes watering, SOB, wheezing  . Cefepime Other (See Comments)  . Tape     Family History  Problem Relation Age of Onset  . Irritable bowel syndrome Sister   . Diabetes Sister   . Heart disease Mother   . Diabetes Mother   . Heart disease Father   . Rheumatic fever Father        as child  . Psoriasis Brother   . Arthritis Brother   . Diabetes Sister   . Diabetes Sister       Prior to Admission medications   Medication Sig Start Date End Date Taking? Authorizing Provider  acetaminophen  (TYLENOL) 500 MG tablet Take 1,000 mg by mouth daily as needed for moderate pain or headache.     [provider]  Adalimumab ([name] PEN) 40 MG/0.4ML PNKT Inject 40 mg into the muscle once a week.  11/27/18   [provider]  Alcohol Swabs PADS Use as directed to check blood sugar three times daily for insulin dependent type 2 diabetes. 10/20/17   [name], MD  amLODipine (NORVASC) 10 MG tablet TAKE 1 TABLET(10 MG) BY MOUTH DAILY AS NEEDED Patient taking differently: Take 10 mg by mouth daily. 11/11/20   [name], MD  amoxicillin-clavulanate (AUGMENTIN) 875-125 MG tablet Take twice daily as needed for 2 weeks for flares 01/06/21   [provider]  atorvastatin (LIPITOR) 80 MG tablet TAKE 1 TABLET(80 MG) BY MOUTH DAILY Patient taking differently: Take 80 mg by mouth daily. 06/25/20   [name], MD  AURYXIA 1 GM 210 MG(Fe) tablet Take 420 mg by mouth in the morning and at bedtime.  10/23/18   [provider]  Blood Glucose Monitoring Suppl (ONE TOUCH ULTRA 2) w/Device KIT Use as directed to check blood sugar three times daily. E11.9 02/20/18   [name],  [name], MD  carvedilol (COREG) 25 MG tablet Take 1 tablet (25 mg total) by mouth 2 (two) times daily. 06/27/20   [name], MD  cyanocobalamin (,VITAMIN B-12,) 1000 MCG/ML injection Inject 43m once a week for 4 weeks and then once a month for 3 months 09/23/20   VLin Landsman MD  furosemide (LASIX) 80 MG tablet TAKE 1 TABLET(80 MG) BY MOUTH TWICE DAILY Patient taking differently: Take 80 mg by mouth 2 (two) times daily. 11/04/20   FBirdie Sons MD  gabapentin (NEURONTIN) 100 MG capsule Take 100 mg by mouth at bedtime. Take along with 3050mcapsule at bedtime    [provider]  gabapentin (NEURONTIN) 300 MG capsule Take 1 capsule (300 mg total) by mouth at bedtime. 09/14/20   GoMercy RidingMD  hydrALAZINE (APRESOLINE) 50 MG tablet Take 1 tablet (50 mg total) by mouth every 8  (eight) hours. 01/20/21   FiBirdie SonsMD  levETIRAcetam (KEPPRA) 500 MG tablet Take 1 tablet (500 mg total) by mouth daily AND 1 tablet (500 mg total) every Monday, Wednesday, and Friday. After dialysis. 09/14/20   GoMercy RidingMD  lisinopril (ZESTRIL) 40 MG tablet  12/17/20   [provider]  sevelamer carbonate (RENVELA) 800 MG tablet TAKE 1 TABLET(800 MG) BY MOUTH THREE TIMES DAILY 09/22/20   FiBirdie SonsMD  spironolactone (ALDACTONE) 25 MG tablet Take 1 tablet by mouth daily. 12/17/20 12/17/21  [provider]    Physical Exam: Vitals:   02/06/21 1830 02/06/21 2000 02/06/21 2030 02/06/21 2035  BP: (!) 177/77 (!) 175/70 (!) 178/78   Pulse: 82 77 76 75  Resp: (!) 24 (!) 21 (!) 21 20  Temp:    99.2 F (37.3 C)  TempSrc:    Oral  SpO2: 99% 97% 98% 98%  Weight:      Height:         Vitals:   02/06/21 1830 02/06/21 2000 02/06/21 2030 02/06/21 2035  BP: (!) 177/77 (!) 175/70 (!) 178/78   Pulse: 82 77 76 75  Resp: (!) 24 (!) 21 (!) 21 20  Temp:    99.2 F (37.3 C)  TempSrc:    Oral  SpO2: 99% 97% 98% 98%  Weight:      Height:          Constitutional: Alert and oriented x 3 . Not in any apparent distress HEENT:      Head: Normocephalic and atraumatic.         Eyes: PERLA, EOMI, Conjunctivae are normal. Sclera is non-icteric.       Mouth/Throat: Mucous membranes are moist.       Neck: Supple with no signs of meningismus. Cardiovascular: Regular rate and rhythm. No murmurs, gallops, or rubs. 2+ symmetrical distal pulses are present . No JVD. No LE edema Respiratory: Respiratory effort normal .Lungs sounds clear bilaterally. No wheezes, crackles, or rhonchi.  Gastrointestinal: Soft, non tender, and non distended with positive bowel sounds.  Genitourinary: No CVA tenderness. Musculoskeletal: Nontender with normal range of motion in all extremities.  Bilateral BKA Neurologic:  Face is symmetric. Moving all extremities. No gross focal neurologic deficits  . Skin: Skin is warm, dry.  Lateral left buttock warmth, fluctuance with copious drainage of purulent material  Psychiatric: Mood and affect are normal    Labs on Admission: I have personally reviewed following labs and imaging studies  CBC: Recent Labs  Lab 02/06/21 1756  WBC 18.4*  NEUTROABS  16.9*  HGB 9.2*  HCT 29.7*  MCV 84.4  PLT 035*   Basic Metabolic Panel: Recent Labs  Lab 02/06/21 1756  NA 139  K 3.0*  CL 97*  CO2 29  GLUCOSE 260*  BUN 19  CREATININE 3.76*  CALCIUM 8.7*   GFR: Estimated Creatinine Clearance: 20.5 mL/min (A) (by C-G formula based on SCr of 3.76 mg/dL (H)). Liver Function Tests: Recent Labs  Lab 02/06/21 1756  AST 13*  ALT 8  ALKPHOS 64  BILITOT 0.9  PROT 8.2*  ALBUMIN 3.4*   No results for input(s): LIPASE, AMYLASE in the last 168 hours. No results for input(s): AMMONIA in the last 168 hours. Coagulation Profile: Recent Labs  Lab 02/06/21 1756  INR 1.1   Cardiac Enzymes: No results for input(s): CKTOTAL, CKMB, CKMBINDEX, TROPONINI in the last 168 hours. BNP (last 3 results) No results for input(s): PROBNP in the last 8760 hours. HbA1C: No results for input(s): HGBA1C in the last 72 hours. CBG: No results for input(s): GLUCAP in the last 168 hours. Lipid Profile: No results for input(s): CHOL, HDL, LDLCALC, TRIG, CHOLHDL, LDLDIRECT in the last 72 hours. Thyroid Function Tests: No results for input(s): TSH, T4TOTAL, FREET4, T3FREE, THYROIDAB in the last 72 hours. Anemia Panel: No results for input(s): VITAMINB12, FOLATE, FERRITIN, TIBC, IRON, RETICCTPCT in the last 72 hours. Urine analysis:    Component Value Date/Time   COLORURINE YELLOW (A) 02/06/2021 2041   APPEARANCEUR CLOUDY (A) 02/06/2021 2041   LABSPEC 1.024 02/06/2021 2041   PHURINE 7.0 02/06/2021 2041   GLUCOSEU 150 (A) 02/06/2021 2041   HGBUR MODERATE (A) 02/06/2021 2041   BILIRUBINUR NEGATIVE 02/06/2021 2041   KETONESUR NEGATIVE 02/06/2021 2041   PROTEINUR  >=300 (A) 02/06/2021 2041   NITRITE NEGATIVE 02/06/2021 2041   LEUKOCYTESUR NEGATIVE 02/06/2021 2041    Radiological Exams on Admission: CT ABDOMEN PELVIS WO CONTRAST  Result Date: 02/06/2021 CLINICAL DATA:  Abdominal pain and fever since dialysis. EXAM: CT ABDOMEN AND PELVIS WITHOUT CONTRAST TECHNIQUE: Multidetector CT imaging of the abdomen and pelvis was performed following the standard protocol without IV contrast. COMPARISON:  11/18/2020 FINDINGS: Lower chest: Motion artifact limits the examination. Small right pleural effusion with probable right pleural thickening and calcification. No change since prior study. Mild interstitial changes in the lung bases may indicate edema. Cardiac enlargement. Hepatobiliary: No focal liver lesions identified on noncontrast imaging. Small stones in the gallbladder. No gallbladder wall thickening or edema. Bile ducts are not dilated. Pancreas: Unremarkable. No pancreatic ductal dilatation or surrounding inflammatory changes. Spleen: Spleen is mildly enlarged.  No focal lesions identified. Adrenals/Urinary Tract: No adrenal gland nodules. Kidneys are symmetrical. No hydronephrosis or stone. Bladder is decompressed. Stomach/Bowel: Stomach, small bowel, and colon are mostly decompressed. Scattered stool in the colon. No inflammatory changes appreciated. Appendix is segmentally identified and appears normal. Vascular/Lymphatic: Extensive vascular calcification throughout the abdomen and pelvis. Aortic calcification. No aortic aneurysm. Reproductive: Prostate gland is diffusely enlarged. Other: No free air or free fluid in the abdomen. Subcutaneous soft tissue gas in the upper abdomen likely represents injection sites. Musculoskeletal: Diffuse bone sclerosis likely representing renal osteodystrophy. Degenerative changes in the lumbar spine. IMPRESSION: 1. No acute process demonstrated in the abdomen or pelvis. No evidence of bowel obstruction or inflammation. 2.  Cholelithiasis without evidence of cholecystitis. 3. Extensive vascular calcification throughout the abdomen and pelvis. 4. Enlarged prostate gland. 5. Small right pleural effusion with probable right pleural thickening and calcification. No change since prior study. 6. Diffuse  bone sclerosis likely representing renal osteodystrophy. Aortic Atherosclerosis (ICD10-I70.0). Electronically Signed   By: Lucienne Capers M.D.   On: 02/06/2021 21:23   CT Head Wo Contrast  Result Date: 02/06/2021 CLINICAL DATA:  Delirium and confusion. Possible seizure today. Dialysis today. EXAM: CT HEAD WITHOUT CONTRAST TECHNIQUE: Contiguous axial images were obtained from the base of the skull through the vertex without intravenous contrast. COMPARISON:  CT 11/18/2020.  MRI 11/21/2020 FINDINGS: Brain: Diffuse cerebral atrophy. Mild ventricular dilatation consistent with central atrophy. Low-attenuation changes in the deep white matter consistent small vessel ischemia. Old area of encephalomalacia in the right frontal lobe with associated calcifications, likely old infarct. Asymmetric low-attenuation in the left temporal lobe limited visualization of this area due to streak artifact but this appears to correspond to a cystic lesion with some surrounding white matter edema on the previous MRI. This could represent old infarct or a cystic mass lesion. Suggest follow-up MRI with contrast for further evaluation. No acute intracranial hemorrhage. Vascular: No hyperdense vessel or unexpected calcification. Skull: Calvarium appears intact. Sinuses/Orbits: Paranasal sinuses and mastoid air cells are clear. Other: Hyperdense soft tissue swelling over the posterior neck, likely soft tissue subcutaneous hematoma. IMPRESSION: 1. No acute intracranial abnormalities. 2. Chronic atrophy and small vessel ischemic changes. 3. Old area of encephalomalacia in the right frontal lobe with associated calcifications, likely old infarct. 4. Asymmetric  low-attenuation in the left temporal lobe appears to correspond to a cystic lesion with some surrounding white matter edema on the previous MRI. This could represent old infarct or a cystic mass lesion. Suggest follow-up MRI with contrast for further evaluation. 5. Hyperdense soft tissue swelling over the posterior neck, likely soft tissue subcutaneous hematoma. Electronically Signed   By: Lucienne Capers M.D.   On: 02/06/2021 19:59   DG Chest Port 1 View  Result Date: 02/06/2021 CLINICAL DATA:  Possible sepsis EXAM: PORTABLE CHEST 1 VIEW COMPARISON:  11/18/2020 FINDINGS: Stable cardiomegaly. Atherosclerotic calcification of the aortic knob. Chronic coarsened interstitial markings. No vascular congestion or overt edema. No focal consolidation. Possible trace right pleural effusion. No pneumothorax. Chronic healed left-sided rib fractures. Unchanged vascular stents. IMPRESSION: Possible trace right pleural effusion. No focal consolidation. Electronically Signed   By: Davina Poke D.O.   On: 02/06/2021 18:37     Assessment/Plan 60 year old male with history of DM, HTN, ESRD on HD, seizure disorder, CVA, hydradenitis suppurativa and cutaneous Crohn's on [name] and as needed Augmentin for flareups, followed by [facility] dermatology, status post recent hospitalization for GI bleed, presenting with altered mental status/possible seizure, who was brought to the emergency room with altered mental status and confusion following dialysis earlier in the day.  In ER, febrile with leukocytosis of 18,000.    Acute metabolic encephalopathy   Mild cognitive deficit - Resolving but likely related to acute infection - CT head showing no acute intracranial findings - Patient normally awake and alert but with confusion noted at dialysis.  No seizure activity was witnessed - Continue to monitor mental status - Fall and aspiration precautions - Patient was hospitalized in February 2022 with AMS and concern for  seizure    Sepsis secondary to cellulitis right buttock   Hidradenitis suppurativa   Long term current use of immunosuppressive drug/[name]   Disorder of skin due to Crohn's disease/cutaneous Crohn's - Patient has chronic wound with drainage of left buttock related to his hidradenitis suppurativa and is followed by [facility] dermatology and takes Augmentin as needed for flareups - At high risk for infection  due to being on long-term [name] - Presents with altered mental status, and is febrile at one 1.7 with leukocytosis of 18,000 - Source suspected cellulitis/abscess related to wound on right buttock - CT abdomen and pelvis showed no acute process - Urinalysis only minimally abnormal - Sepsis fluids - IV Zyvox (vancomycin allergy) and ceftriaxone - Follow urine culture to evaluate for UTI - Consider surgical consult in the a.m. for assessment of wound and need for exploration    Hypertension - Continue home meds pending med rec     ESRD on HD MWF   Anemia due to chronic kidney disease - Last dialysis 4/22 - Hemoglobin at 9.2 which is above baseline - Continue Auryxia and vitamin B12 pending med rec - Continue Renvela - Nephrology consult for continuation of dialysis   Type 2 diabetes mellitus with kidney complication, with long-term current use of insulin ([place]) - Sliding scale insulin coverage      History of CVA (cerebrovascular accident)   Post stroke seizure disorder and cognitive deficit - CT head with no acute findings and patient with no focal deficit - Continue antiplatelets and statins pending med rec - Continue neurologic checks    Seizure disorder status post CVA [facility]) - Patient had episodes of confusion in November 2021, again in February 2022 - Last EEG November 2021 was normal - Continue Keppra pending med rec-on 500 twice daily with an extra dose postdialysis per last neurology note - Acute seizure not suspected, neither is it ruled out - Consider neurology consult  in the a.m. - Neurologic checks and seizure precautions    S/P bilateral BKA (below knee amputation) ([place]) - Increase nursing assistance for transfers.  Trapeze above bed if  desired    Chronic combined systolic and diastolic CHF (congestive heart failure) ([place]) - Not acutely exacerbated - Last echo November 2021 with EF 40 to 45% and grade 2 diastolic dysfunction - Monitor for worsening in view of IV hydration and the treatment of sepsis - Continue lisinopril, furosemide, spironolactone, carvedilol  Essential hypertension - Continue amlodipine, lisinopril, Coreg and hydralazine    History of GI bleed - Recent GI bleed in February 2022, requiring 2 units PRBC - Hemoglobin at baseline - EGD with duodenitis, colonoscopy with polyp status post polypectomy - Has been off NSAIDs - Anorexia    DVT prophylaxis: Heparin Code Status: full code  Family Communication:  none  Disposition Plan: Back to previous home environment Consults called: Nephrology Status:At the time of admission, it appears that the appropriate admission status for this patient is INPATIENT. This is judged to be reasonable and necessary in order to provide the required intensity of service to ensure the patient's safety given the presenting symptoms, physical exam findings, and initial radiographic and laboratory data in the context of their  Comorbid conditions.   Patient requires inpatient status due to high intensity of service, high risk for further deterioration and high frequency of surveillance required.   I certify that at the point of admission it is my clinical judgment that the patient will require inpatient hospital care spanning beyond [place] MD [facility]     02/06/2021, 10:22 PM

## 2021-02-06 NOTE — Progress Notes (Signed)
PHARMACY -  BRIEF ANTIBIOTIC NOTE   Pharmacy has received consult(s) for Zosyn from an ED provider.  The patient's profile has been reviewed for ht/wt/allergies/indication/available labs.    One time order(s) placed for Zosyn 3.375g  Further antibiotics/pharmacy consults should be ordered by admitting physician if indicated.                       Thank you, Vira Blanco 02/06/2021  6:03 PM

## 2021-02-06 NOTE — Sepsis Progress Note (Signed)
elink is following this sepsis

## 2021-02-06 NOTE — ED Triage Notes (Signed)
Pt BIBA from home bc wife states patient is "acting a little different than usual". On arrival pt is alert and oriented x4. Pt had dialysis today and had 2L pulled off today. Pts wife thinks pt may have had a seizure today, but reports no signs of seizure like activity. Hx of dementia and seizures. Pt is compliant with seizure medications and has taken them today. Pt c/o feeling a little off since dialysis.

## 2021-02-06 NOTE — Progress Notes (Signed)
CODE SEPSIS - PHARMACY COMMUNICATION  **Broad Spectrum Antibiotics should be administered within 1 hour of Sepsis diagnosis**  Time Code Sepsis Called/Page Received: 17:57  Antibiotics Ordered: Zosyn  Time of 1st antibiotic administration: 18:42  Additional action taken by pharmacy: n/a  If necessary, Name of Provider/Nurse Contacted: n/a    Vira Blanco ,PharmD Clinical Pharmacist  02/06/2021  6:04 PM

## 2021-02-06 NOTE — ED Notes (Signed)
Request made for transport to the floor ?

## 2021-02-06 NOTE — ED Notes (Signed)
Clothes changed due to episode of urinary incontinence. Patient cleaned, brief in use. Small wound observed on left buttock, pt's wife reports this is an old issue that "sometimes drains". Cloudy, red, thick drainage observed from wound. Dr Ellender Hose notified.

## 2021-02-07 DIAGNOSIS — L732 Hidradenitis suppurativa: Secondary | ICD-10-CM

## 2021-02-07 DIAGNOSIS — N186 End stage renal disease: Secondary | ICD-10-CM

## 2021-02-07 DIAGNOSIS — L0231 Cutaneous abscess of buttock: Secondary | ICD-10-CM | POA: Diagnosis not present

## 2021-02-07 DIAGNOSIS — R4182 Altered mental status, unspecified: Secondary | ICD-10-CM | POA: Diagnosis not present

## 2021-02-07 DIAGNOSIS — E876 Hypokalemia: Secondary | ICD-10-CM

## 2021-02-07 DIAGNOSIS — G9341 Metabolic encephalopathy: Secondary | ICD-10-CM

## 2021-02-07 DIAGNOSIS — Z992 Dependence on renal dialysis: Secondary | ICD-10-CM

## 2021-02-07 DIAGNOSIS — I5022 Chronic systolic (congestive) heart failure: Secondary | ICD-10-CM

## 2021-02-07 LAB — GLUCOSE, CAPILLARY
Glucose-Capillary: 219 mg/dL — ABNORMAL HIGH (ref 70–99)
Glucose-Capillary: 129 mg/dL — ABNORMAL HIGH (ref 70–99)
Glucose-Capillary: 150 mg/dL — ABNORMAL HIGH (ref 70–99)
Glucose-Capillary: 130 mg/dL — ABNORMAL HIGH (ref 70–99)

## 2021-02-07 LAB — HEMOGLOBIN A1C
Hgb A1c MFr Bld: 6.3 % — ABNORMAL HIGH (ref 4.8–5.6)
Mean Plasma Glucose: 134.11 mg/dL

## 2021-02-07 LAB — CBC
HCT: 28.4 % — ABNORMAL LOW (ref 39.0–52.0)
Hemoglobin: 8.8 g/dL — ABNORMAL LOW (ref 13.0–17.0)
MCH: 26.5 pg (ref 26.0–34.0)
MCHC: 31 g/dL (ref 30.0–36.0)
MCV: 85.5 fL (ref 80.0–100.0)
Platelets: 119 10*3/uL — ABNORMAL LOW (ref 150–400)
RBC: 3.32 MIL/uL — ABNORMAL LOW (ref 4.22–5.81)
RDW: 21 % — ABNORMAL HIGH (ref 11.5–15.5)
WBC: 14 10*3/uL — ABNORMAL HIGH (ref 4.0–10.5)
nRBC: 0 % (ref 0.0–0.2)

## 2021-02-07 LAB — BASIC METABOLIC PANEL
Anion gap: 16 — ABNORMAL HIGH (ref 5–15)
BUN: 25 mg/dL — ABNORMAL HIGH (ref 6–20)
CO2: 28 mmol/L (ref 22–32)
Calcium: 8.7 mg/dL — ABNORMAL LOW (ref 8.9–10.3)
Chloride: 93 mmol/L — ABNORMAL LOW (ref 98–111)
Creatinine, Ser: 4.77 mg/dL — ABNORMAL HIGH (ref 0.61–1.24)
GFR, Estimated: 13 mL/min — ABNORMAL LOW (ref >60)
Glucose, Bld: 262 mg/dL — ABNORMAL HIGH (ref 70–99)
Potassium: 2.7 mmol/L — CL (ref 3.5–5.1)
Sodium: 137 mmol/L (ref 135–145)

## 2021-02-07 LAB — PROCALCITONIN: Procalcitonin: 33.53 ng/mL

## 2021-02-07 LAB — PROTIME-INR
INR: 1.2 (ref 0.8–1.2)
Prothrombin Time: 15.3 seconds — ABNORMAL HIGH (ref 11.4–15.2)

## 2021-02-07 LAB — POTASSIUM: Potassium: 3.9 mmol/L (ref 3.5–5.1)

## 2021-02-07 LAB — MAGNESIUM: Magnesium: 1.6 mg/dL — ABNORMAL LOW (ref 1.7–2.4)

## 2021-02-07 LAB — CORTISOL-AM, BLOOD: Cortisol - AM: 15.2 ug/dL (ref 6.7–22.6)

## 2021-02-07 MED ORDER — HYDRALAZINE HCL 50 MG PO TABS
50.0000 mg | ORAL_TABLET | Freq: Three times a day (TID) | ORAL | Status: DC
  Administered 2021-02-07 – 2021-02-09 (×6): 50 mg via ORAL
  Filled 2021-02-07 (×6): qty 1

## 2021-02-07 MED ORDER — MAGNESIUM SULFATE 2 GM/50ML IV SOLN
2.0000 g | Freq: Once | INTRAVENOUS | Status: AC
  Administered 2021-02-07: 2 g via INTRAVENOUS
  Filled 2021-02-07: qty 50

## 2021-02-07 MED ORDER — CARVEDILOL 25 MG PO TABS
25.0000 mg | ORAL_TABLET | Freq: Two times a day (BID) | ORAL | Status: DC
  Administered 2021-02-07 – 2021-02-10 (×7): 25 mg via ORAL
  Filled 2021-02-07 (×7): qty 1

## 2021-02-07 MED ORDER — POTASSIUM CHLORIDE 10 MEQ/100ML IV SOLN
10.0000 meq | INTRAVENOUS | Status: DC
  Administered 2021-02-07: 10 meq via INTRAVENOUS
  Filled 2021-02-07: qty 100

## 2021-02-07 MED ORDER — POTASSIUM CHLORIDE CRYS ER 20 MEQ PO TBCR
40.0000 meq | EXTENDED_RELEASE_TABLET | Freq: Once | ORAL | Status: AC
  Administered 2021-02-07: 40 meq via ORAL
  Filled 2021-02-07: qty 2

## 2021-02-07 MED ORDER — POTASSIUM CHLORIDE 10 MEQ/100ML IV SOLN
10.0000 meq | INTRAVENOUS | Status: DC
  Administered 2021-02-07 (×2): 10 meq via INTRAVENOUS
  Filled 2021-02-07 (×2): qty 100

## 2021-02-07 MED ORDER — AMLODIPINE BESYLATE 10 MG PO TABS
10.0000 mg | ORAL_TABLET | Freq: Every day | ORAL | Status: DC
  Administered 2021-02-07 – 2021-02-10 (×4): 10 mg via ORAL
  Filled 2021-02-07 (×4): qty 1

## 2021-02-07 MED ORDER — ATORVASTATIN CALCIUM 20 MG PO TABS
80.0000 mg | ORAL_TABLET | Freq: Every day | ORAL | Status: DC
  Administered 2021-02-07 – 2021-02-10 (×4): 80 mg via ORAL
  Filled 2021-02-07 (×4): qty 4

## 2021-02-07 MED ORDER — GABAPENTIN 300 MG PO CAPS
300.0000 mg | ORAL_CAPSULE | Freq: Every day | ORAL | Status: DC
  Administered 2021-02-07 – 2021-02-09 (×3): 300 mg via ORAL
  Filled 2021-02-07 (×3): qty 1

## 2021-02-07 NOTE — Consult Note (Signed)
Neurology Consultation  Reason for Consult: Seizure, altered mental status Referring Physician: Dr. Manuella Ghazi, hospitalist  CC: Altered mental status  History is obtained from:, Patient's wife, chart  HPI: [name] is a 60 y.o. male past medical history of Crohn's, ICH with right MCA territory encephalomalacia, diabetes, ESRD who was brought in for questionable seizure activity and altered mental status while at dialysis. He has been seen by neurology on a few occasions in the last few months for confusion and possible seizures. He has been evaluated by neuropsychology at [facility] and diagnosed with major neurocognitive deficit likely vascular dementia. Wife has been reported in the past that he has been behaving like a much older person and has been having trouble with his memory. Neurological consultation was obtained for probable breakthrough seizure at dialysis. The wife did not witness it but was told that he was altered. According to the H&P, he was brought in for confusion at dialysis but by the time he came to the emergency room his mentation was improved.  He did endorse feeling confused after dialysis and was complaining of ongoing aching and throbbing of chronic wound of the left buttock related to his hidradenitis with persistent discharge.  He also had a fever and leukocytosis, for that reason he was admitted to the hospital.  His wife is concerned that he has been acting confused at times and seems to have ongoing and progressive issues with his memory.  ROS: Full ROS was performed and is negative except as noted in the HPI.   Past Medical History:  Diagnosis Date  . Acute metabolic encephalopathy 11/23/6387  . Anemia   . Crohn disease ([place])   . Diabetes mellitus without complication ([place])   . DVT of lower extremity (deep venous thrombosis) ([place]) 2016  . Empyema ([place]) 05/20/2017  . Encephalopathy 12/04/2017  . Fall at home, initial encounter 09/12/2020  . Hidradenitis suppurativa    . Hypertension   . ICH (intracerebral hemorrhage) ([place])   . Peritonitis ([place]) 04/21/2017  . Pyogenic arthritis of knee ([place]) 02/04/2016  . Renal disorder   . Sepsis ([place]) 01/12/2018  . Stroke [facility])         Family History  Problem Relation Age of Onset  . Irritable bowel syndrome Sister   . Diabetes Sister   . Heart disease Mother   . Diabetes Mother   . Heart disease Father   . Rheumatic fever Father        as child  . Psoriasis Brother   . Arthritis Brother   . Diabetes Sister   . Diabetes Sister      Social History:   reports that he has quit smoking. He has never used smokeless tobacco. He reports current drug use. Drug: Marijuana. He reports that he does not drink alcohol.  Medications  Current Facility-Administered Medications:  .  0.9 %  sodium chloride infusion, , Intravenous, Continuous, Athena Masse, MD, Last Rate: 50 mL/hr at 02/07/21 0253, Infusion Verify at 02/07/21 0253 .  acetaminophen (TYLENOL) tablet 650 mg, 650 mg, Oral, Q6H PRN **OR** acetaminophen (TYLENOL) suppository 650 mg, 650 mg, Rectal, Q6H PRN, Judd Gaudier V, MD .  amLODipine (NORVASC) tablet 10 mg, 10 mg, Oral, Daily, Manuella Ghazi, Vipul, MD, 10 mg at 02/07/21 1430 .  atorvastatin (LIPITOR) tablet 80 mg, 80 mg, Oral, Daily, Manuella Ghazi, Vipul, MD, 80 mg at 02/07/21 1430 .  carvedilol (COREG) tablet 25 mg, 25 mg, Oral, BID, Manuella Ghazi, Vipul, MD, 25 mg at 02/07/21 1430 .  cefTRIAXone (ROCEPHIN) 2 g in sodium chloride 0.9 % 100 mL IVPB, 2 g, Intravenous, Q24H, Athena Masse, MD, Stopped at 02/07/21 0226 .  gabapentin (NEURONTIN) capsule 300 mg, 300 mg, Oral, QHS, Shah, Vipul, MD .  heparin injection 5,000 Units, 5,000 Units, Subcutaneous, Q8H, Athena Masse, MD, 5,000 Units at 02/07/21 1429 .  hydrALAZINE (APRESOLINE) tablet 50 mg, 50 mg, Oral, Q8H, Manuella Ghazi, Vipul, MD, 50 mg at 02/07/21 1430 .  HYDROcodone-acetaminophen (NORCO/VICODIN) 5-325 MG per tablet 1-2 tablet, 1-2 tablet, Oral, Q4H PRN, Judd Gaudier V,  MD .  HYDROmorphone (DILAUDID) injection 0.5 mg, 0.5 mg, Intravenous, Once, Duffy Bruce, MD .  insulin aspart (novoLOG) injection 0-5 Units, 0-5 Units, Subcutaneous, QHS, Athena Masse, MD, 3 Units at 02/07/21 0020 .  insulin aspart (novoLOG) injection 0-9 Units, 0-9 Units, Subcutaneous, TID WC, Athena Masse, MD, 1 Units at 02/07/21 1232 .  ketorolac (TORADOL) 30 MG/ML injection 30 mg, 30 mg, Intravenous, Q6H PRN, Athena Masse, MD .  levETIRAcetam (KEPPRA) tablet 500 mg, 500 mg, Oral, BID, Athena Masse, MD, 500 mg at 02/07/21 0835 .  linezolid (ZYVOX) IVPB 600 mg, 600 mg, Intravenous, Q12H, Athena Masse, MD, Last Rate: 300 mL/hr at 02/07/21 1432, 600 mg at 02/07/21 1432 .  ondansetron (ZOFRAN) tablet 4 mg, 4 mg, Oral, Q6H PRN **OR** ondansetron (ZOFRAN) injection 4 mg, 4 mg, Intravenous, Q6H PRN, Athena Masse, MD, 4 mg at 02/07/21 9983   Exam: Current vital signs: BP (!) 168/64 (BP Location: Right Arm)   Pulse 64   Temp 98.3 F (36.8 C) (Oral)   Resp 18   Ht 5' 11"  (1.803 m)   Wt 67.6 kg   SpO2 96%   BMI 20.79 kg/m  Vital signs in last 24 hours: Temp:  [98.2 F (36.8 C)-101.7 F (38.7 C)] 98.3 F (36.8 C) (04/23 1430) Pulse Rate:  [63-82] 64 (04/23 1430) Resp:  [15-24] 18 (04/23 1430) BP: (143-188)/(63-85) 168/64 (04/23 1430) SpO2:  [96 %-100 %] 96 % (04/23 1430) Weight:  [67.6 kg] 67.6 kg (04/22 1733) General: Awake alert in no distress HEENT: Normocephalic/atraumatic Lungs: Clear Cardiovascular: Regular rhythm Abdomen soft nondistended nontender Extremities: Bilateral leg amputations Neurological exam Awake alert oriented to the fact that he has at [facility], its the month of April, year 2022, he knows the president. He is able to follow commands. Has somewhat reduced attention concentration Mild dysarthria No aphasia Cranials: Pupils equal round react light, extract movements intact, visual fields full, facial sensation intact, face symmetric, tongue and  palate midline. Motor exam: Bilateral upper extremities 5/5 without drift.  Bilateral lower extremities 5/5 at the hip-amputations distally. Sensory exam: Intact to touch Cerebellar: No ataxia on finger-nose-finger testing   Labs I have reviewed labs in epic and the results pertinent to this consultation are:   CBC    Component Value Date/Time   WBC 14.0 (H) 02/07/2021 0414   RBC 3.32 (L) 02/07/2021 0414   HGB 8.8 (L) 02/07/2021 0414   HGB 8.0 (L) 12/02/2020 1414   HCT 28.4 (L) 02/07/2021 0414   HCT 24.9 (L) 12/02/2020 1414   PLT 119 (L) 02/07/2021 0414   PLT 134 (L) 12/02/2020 1414   MCV 85.5 02/07/2021 0414   MCV 82 12/02/2020 1414   MCV 83 07/24/2014 1524   MCH 26.5 02/07/2021 0414   MCHC 31.0 02/07/2021 0414   RDW 21.0 (H) 02/07/2021 0414   RDW 17.5 (H) 12/02/2020 1414   RDW 15.7 (H) 07/24/2014 1524  LYMPHSABS 0.5 (L) 02/06/2021 1756   LYMPHSABS 2.2 07/24/2014 1524   MONOABS 0.6 02/06/2021 1756   MONOABS 1.2 (H) 07/24/2014 1524   EOSABS 0.1 02/06/2021 1756   EOSABS 0.0 07/24/2014 1524   BASOSABS 0.1 02/06/2021 1756   BASOSABS 0.1 07/24/2014 1524    CMP     Component Value Date/Time   NA 137 02/07/2021 0414   NA 140 05/27/2020 1006   NA 134 (L) 07/24/2014 1524   K 3.9 02/07/2021 1045   K 4.3 07/24/2014 1524   CL 93 (L) 02/07/2021 0414   CL 103 07/24/2014 1524   CO2 28 02/07/2021 0414   CO2 21 07/24/2014 1524   GLUCOSE 262 (H) 02/07/2021 0414   GLUCOSE 168 (H) 07/24/2014 1524   BUN 25 (H) 02/07/2021 0414   BUN 23 05/27/2020 1006   BUN 62 (H) 07/24/2014 1524   CREATININE 4.77 (H) 02/07/2021 0414   CREATININE 4.95 (H) 07/24/2014 1524   CALCIUM 8.7 (L) 02/07/2021 0414   CALCIUM 9.3 07/24/2014 1524   PROT 8.2 (H) 02/06/2021 1756   PROT 8.0 05/27/2020 1006   ALBUMIN 3.4 (L) 02/06/2021 1756   ALBUMIN 4.5 05/27/2020 1006   AST 13 (L) 02/06/2021 1756   ALT 8 02/06/2021 1756   ALKPHOS 64 02/06/2021 1756   BILITOT 0.9 02/06/2021 1756   BILITOT 0.5  05/27/2020 1006   GFRNONAA 13 (L) 02/07/2021 0414   GFRNONAA 13 (L) 07/24/2014 1524   GFRAA 14 (L) 05/27/2020 1006   GFRAA 16 (L) 07/24/2014 1524   Imaging I have reviewed the images obtained:  CT-scan of the brain-no acute changes.  Chronic atrophy and small vessel disease.  Old area of encephalomalacia in the right frontal lobe with associated calcifications likely from his old ICH.  Asymmetric low-attenuation of the left temporal lobe appears to correspond to a cystic lesion with some surrounding white matter edema on the previous MRI-again old infarct or cystic mass lesion-follow-up MRI recommended by radiology.  On my review it looks similar to what was seen in the MRI February 2022.  Assessment:  60 year old with past history of Crohn's, right frontal ICH with encephalomalacia, diabetes, ESRD, vascular dementia, presenting for altered mental status/decreased level of consciousness and questionable seizure at dialysis. Currently on Keppra 500 twice daily at home with additional 500 mg Keppra dose on dialysis days. I suspect that his current presentation might reflect a breakthrough seizure in the setting of underlying infection or dialysis disequilibrium syndrome. His ongoing cognitive deficits are likely the natural history of his underlying cognitive deficit-likely vascular dementia. He has had neurocognitive testing done outpatient and I would recommend outpatient follow-up for his vascular dementia.  Impression:  Breakthrough seizure in the setting of underlying infection versus dialysis disequilibrium syndrome  Underlying vascular dementia  Prior ICH  Crohn's on [name] treatment  Recommendations:  At this time, would not recommend making any changes to his antiepileptics.  Continue Keppra 500 twice daily with additional 500 after dialysis as prior prescribed  I would recommend outpatient neurology follow-up for management and treatment of his vascular dementia.  I described  his imaging findings and showed the scans to his wife in detail who had been told that he has had extensive brain damage but was surprised to look at the images and verbalized understanding of the process of neurodegeneration associated post strokes and with vascular dementia.  Recent MRI was February 2022-do not see need for another MRI at this time.  Management of his underlying medical conditions and  infections per primary team as you are  Outpatient neurology follow-up  in the next 4 to 6 weeks.  Inpatient neurology service will be available as needed.  Please call with questions.  Plan discussed with Dr. Manuella Ghazi  -- Amie Portland, MD Neurologist [facility] Pager: [phone]

## 2021-02-07 NOTE — Progress Notes (Signed)
[place] [facility] [place] NAME: [name]    MR#:  [id number]  DATE OF BIRTH:  05/05/1961  SUBJECTIVE:  CHIEF COMPLAINT:   Chief Complaint  Patient presents with  . Altered Mental Status  Slow in response but very coherent.  Denies any other issues.  Showing his wound on his back.  Feels weak REVIEW OF SYSTEMS:  Review of Systems  Constitutional: Positive for malaise/fatigue. Negative for diaphoresis, fever and weight loss.  HENT: Negative for ear discharge, ear pain, hearing loss, nosebleeds, sore throat and tinnitus.   Eyes: Negative for blurred vision and pain.  Respiratory: Negative for cough, hemoptysis, shortness of breath and wheezing.   Cardiovascular: Negative for chest pain, palpitations, orthopnea and leg swelling.  Gastrointestinal: Negative for abdominal pain, blood in stool, constipation, diarrhea, heartburn, nausea and vomiting.  Genitourinary: Negative for dysuria, frequency and urgency.  Musculoskeletal: Negative for back pain and myalgias.  Skin: Positive for rash. Negative for itching.  Neurological: Negative for dizziness, tingling, tremors, focal weakness, seizures, weakness and headaches.  Psychiatric/Behavioral: Negative for depression. The patient is not nervous/anxious.    DRUG ALLERGIES:   Allergies  Allergen Reactions  . Methotrexate Other (See Comments)    Blood count drops  . Vancomycin Shortness Of Breath    Eyes watering, SOB, wheezing  . Cefepime Other (See Comments)  . Tape    VITALS:  Blood pressure (!) 163/63, pulse 63, temperature 98.6 F (37 C), temperature source Oral, resp. rate 16, height 5' 11"  (1.803 m), weight 67.6 kg, SpO2 97 %. PHYSICAL EXAMINATION:  Physical Exam  60 year old male lying in the bed comfortably without any acute distress Cardiovascular S1-S2 normal, no murmur rales or gallop Lungs clear to auscultation bilaterally no wheezing rales rhonchi crepitation Abdomen soft nontender nondistended  bowel sounds present Neurologically nonfocal exam.  He is slow in his responses likely mild cognitive issues Skin.  See picture   LABORATORY PANEL:  Male CBC Recent Labs  Lab 02/07/21 0414  WBC 14.0*  HGB 8.8*  HCT 28.4*  PLT 119*   ------------------------------------------------------------------------------------------------------------------ Chemistries  Recent Labs  Lab 02/06/21 1756 02/07/21 0414 02/07/21 1045  NA 139 137  --   K 3.0* 2.7* 3.9  CL 97* 93*  --   CO2 29 28  --   GLUCOSE 260* 262*  --   BUN 19 25*  --   CREATININE 3.76* 4.77*  --   CALCIUM 8.7* 8.7*  --   MG  --  1.6*  --   AST 13*  --   --   ALT 8  --   --   ALKPHOS 64  --   --   BILITOT 0.9  --   --    RADIOLOGY:  CT ABDOMEN PELVIS WO CONTRAST  Result Date: 02/06/2021 CLINICAL DATA:  Abdominal pain and fever since dialysis. EXAM: CT ABDOMEN AND PELVIS WITHOUT CONTRAST TECHNIQUE: Multidetector CT imaging of the abdomen and pelvis was performed following the standard protocol without IV contrast. COMPARISON:  11/18/2020 FINDINGS: Lower chest: Motion artifact limits the examination. Small right pleural effusion with probable right pleural thickening and calcification. No change since prior study. Mild interstitial changes in the lung bases may indicate edema. Cardiac enlargement. Hepatobiliary: No focal liver lesions identified on noncontrast imaging. Small stones in the gallbladder. No gallbladder wall thickening or edema. Bile ducts are not dilated. Pancreas: Unremarkable. No pancreatic ductal dilatation or surrounding inflammatory changes. Spleen: Spleen is mildly enlarged.  No focal lesions  identified. Adrenals/Urinary Tract: No adrenal gland nodules. Kidneys are symmetrical. No hydronephrosis or stone. Bladder is decompressed. Stomach/Bowel: Stomach, small bowel, and colon are mostly decompressed. Scattered stool in the colon. No inflammatory changes appreciated. Appendix is segmentally identified and  appears normal. Vascular/Lymphatic: Extensive vascular calcification throughout the abdomen and pelvis. Aortic calcification. No aortic aneurysm. Reproductive: Prostate gland is diffusely enlarged. Other: No free air or free fluid in the abdomen. Subcutaneous soft tissue gas in the upper abdomen likely represents injection sites. Musculoskeletal: Diffuse bone sclerosis likely representing renal osteodystrophy. Degenerative changes in the lumbar spine. IMPRESSION: 1. No acute process demonstrated in the abdomen or pelvis. No evidence of bowel obstruction or inflammation. 2. Cholelithiasis without evidence of cholecystitis. 3. Extensive vascular calcification throughout the abdomen and pelvis. 4. Enlarged prostate gland. 5. Small right pleural effusion with probable right pleural thickening and calcification. No change since prior study. 6. Diffuse bone sclerosis likely representing renal osteodystrophy. Aortic Atherosclerosis (ICD10-I70.0). Electronically Signed   By: Lucienne Capers M.D.   On: 02/06/2021 21:23   CT Head Wo Contrast  Result Date: 02/06/2021 CLINICAL DATA:  Delirium and confusion. Possible seizure today. Dialysis today. EXAM: CT HEAD WITHOUT CONTRAST TECHNIQUE: Contiguous axial images were obtained from the base of the skull through the vertex without intravenous contrast. COMPARISON:  CT 11/18/2020.  MRI 11/21/2020 FINDINGS: Brain: Diffuse cerebral atrophy. Mild ventricular dilatation consistent with central atrophy. Low-attenuation changes in the deep white matter consistent small vessel ischemia. Old area of encephalomalacia in the right frontal lobe with associated calcifications, likely old infarct. Asymmetric low-attenuation in the left temporal lobe limited visualization of this area due to streak artifact but this appears to correspond to a cystic lesion with some surrounding white matter edema on the previous MRI. This could represent old infarct or a cystic mass lesion. Suggest  follow-up MRI with contrast for further evaluation. No acute intracranial hemorrhage. Vascular: No hyperdense vessel or unexpected calcification. Skull: Calvarium appears intact. Sinuses/Orbits: Paranasal sinuses and mastoid air cells are clear. Other: Hyperdense soft tissue swelling over the posterior neck, likely soft tissue subcutaneous hematoma. IMPRESSION: 1. No acute intracranial abnormalities. 2. Chronic atrophy and small vessel ischemic changes. 3. Old area of encephalomalacia in the right frontal lobe with associated calcifications, likely old infarct. 4. Asymmetric low-attenuation in the left temporal lobe appears to correspond to a cystic lesion with some surrounding white matter edema on the previous MRI. This could represent old infarct or a cystic mass lesion. Suggest follow-up MRI with contrast for further evaluation. 5. Hyperdense soft tissue swelling over the posterior neck, likely soft tissue subcutaneous hematoma. Electronically Signed   By: Lucienne Capers M.D.   On: 02/06/2021 19:59   DG Chest Port 1 View  Result Date: 02/06/2021 CLINICAL DATA:  Possible sepsis EXAM: PORTABLE CHEST 1 VIEW COMPARISON:  11/18/2020 FINDINGS: Stable cardiomegaly. Atherosclerotic calcification of the aortic knob. Chronic coarsened interstitial markings. No vascular congestion or overt edema. No focal consolidation. Possible trace right pleural effusion. No pneumothorax. Chronic healed left-sided rib fractures. Unchanged vascular stents. IMPRESSION: Possible trace right pleural effusion. No focal consolidation. Electronically Signed   By: Davina Poke D.O.   On: 02/06/2021 18:37   ASSESSMENT AND PLAN:  60 y.o. male with medical history significant for DM, HTN, ESRD on HD MWF, seizure disorder, CVA, hydradenitis suppurativa and cutaneous Crohn's on [name] and as needed Augmentin for flareups, followed by [facility] dermatology, status post recent hospitalization for GI bleed admitted for altered mental status and  confusion following  dialysis earlier in the day concerning for seizure to his spouse    Acute metabolic encephalopathy   Mild cognitive deficit - Resolving but likely related to acute infection with underlying vascular dementia and multiple medical comorbidities including ESRD - CT head showing no acute intracranial findings -  No seizure activity was witnessed - Continue to monitor mental status - Fall and aspiration precautions - Patient was hospitalized in February 2022 with AMS and concern for seizure -I have requested neurology consult per wife's ask.  Patient follows at [facility] neuro as an outpatient    Sepsis secondary to cellulitis right buttock   Hidradenitis suppurativa   Long term current use of immunosuppressive drug/[name]   Disorder of skin due to Crohn's disease/cutaneous Crohn's - Patient has chronic wound with drainage of left buttock related to his hidradenitis suppurativa and is followed by [facility] dermatology and takes Augmentin as needed for flareups - At high risk for infection due to being on long-term [name] - Presents with altered mental status, and had T-max of 101.7 with leukocytosis of 18,000 on admission - Source suspected cellulitis/abscess related to wound on right buttock - CT abdomen and pelvis showed no acute process - Urinalysis only minimally abnormal -  Continue IV Zyvox (vancomycin allergy) and ceftriaxone for now - Follow urine culture to evaluate for UTI -  I have requested surgical consult for assessment of wound and need for exploration  Hypokalemia/hypomagnesemia Replete and recheck  Essential  Hypertension -  Resume home blood pressure medicine including amlodipine, Coreg and hydralazine     ESRD on HD MWF   Anemia due to chronic kidney disease - Last dialysis 4/22 - Hemoglobin at 8.8 - Continue Auryxia and vitamin B12  - Continue Renvela - Nephrology consult for continuation of dialysis   Type 2 diabetes mellitus with kidney  complication, with long-term current use of insulin (HCC) - Sliding scale insulin coverage      History of CVA (cerebrovascular accident)   Post stroke seizure disorder and cognitive deficit - CT head with no acute findings and patient with no focal deficit -  He is not on any antiplatelets per med rec may be due to history of GI bleed, resume statins  - Continue neurologic checks.  Neuro consult    Seizure disorder status post CVA [facility]) - Patient had episodes of confusion in November 2021, again in February 2022 - Last EEG November 2021 was normal - Continue Keppra 500 twice daily with an extra dose postdialysis per last neurology note - Acute seizure not suspected, neither is it ruled out -  neurology consult requested -Dr. Rory Percy aware - Neurologic checks and seizure precautions    S/P bilateral BKA (below knee amputation) ([place]) - Increase nursing assistance for transfers.  Trapeze above bed if  desired    Chronic combined systolic and diastolic CHF (congestive heart failure) ([place]) - Not acutely exacerbated - Last echo November 2021 with EF 40 to 45% and grade 2 diastolic dysfunction - Monitor for worsening in view of IV hydration and the treatment of sepsis - Continue lisinopril, furosemide, spironolactone, carvedilol    History of GI bleed - Recent GI bleed in February 2022, requiring 2 units PRBC - Hemoglobin at baseline - EGD with duodenitis, colonoscopy with polyp status post polypectomy - Has been off NSAIDs - Anorexia     Body mass index is 20.79 kg/m.  Net IO Since Admission: 259.39 mL [02/07/21 1401]      Status is: Inpatient  Remains inpatient  appropriate because:Ongoing diagnostic testing needed not appropriate for outpatient work up   Dispo: The patient is from: Home              Anticipated d/c is to: Home              Patient currently is not medically stable to d/c.   Difficult to place patient No   DVT prophylaxis:       heparin injection  5,000 Units Start: 02/06/21 2300     Family Communication: Updated patient's wife over phone on 4/23   All the records are reviewed and case discussed with Care Management/Social Worker. Management plans discussed with the patient, family and they are in agreement.  CODE STATUS: Full Code Level of care: Med-Surg  TOTAL TIME TAKING CARE OF THIS PATIENT: 35 minutes.   More than 50% of the time was spent in counseling/coordination of care: YES  POSSIBLE D/C IN 1-2 DAYS, DEPENDING ON CLINICAL CONDITION.   Max Sane M.D on 02/07/2021 at 2:01 PM  [facility]   CC: Primary care physician; [name], MD  Note: This dictation was prepared with Dragon dictation along with smaller phrase technology. Any transcriptional errors that result from this process are unintentional.

## 2021-02-07 NOTE — Progress Notes (Signed)
B Morrison notified pt vomitted after taking portassium

## 2021-02-07 NOTE — Consult Note (Signed)
Date of Consultation:  02/07/2021  Requesting Physician:  Max Sane, MD  Reason for Consultation:  Left buttocks abscess  History of Present Illness: [name] is a 60 y.o. male admitted yesterday with altered mental status and confusion.  The patient has a history of hydradenitis and has a chronic wound on his left buttocks.  The patient reports that it started about 3-4 weeks ago and used to be much more swollen and tender, and it's been draining spontaneously as well.  It has gotten better but still keeps draining.  The patient reports that he has been taking Augmentin for this, which was prescribed for his flare-up episodes.  He's also on [name] and is followed by [facility].  Given the persistent drainage/infection, general surgery was consulted.  Currently the patient seems to be mentating better.  Denies any fevers or chills, chest pain, shortness of breath, nausea, or vomiting.  He reports that he's had other abscesses in the past, and he's also had prior I&D procedures for them.  Of note, he has significant vascular disease and has had bilateral BKA.  Past Medical History: Past Medical History:  Diagnosis Date  . Acute metabolic encephalopathy 02/20/3874  . Anemia   . Crohn disease ([place])   . Diabetes mellitus without complication ([place])   . DVT of lower extremity (deep venous thrombosis) ([place]) 2016  . Empyema ([place]) 05/20/2017  . Encephalopathy 12/04/2017  . Fall at home, initial encounter 09/12/2020  . Hidradenitis suppurativa   . Hypertension   . ICH (intracerebral hemorrhage) ([place])   . Peritonitis ([place]) 04/21/2017  . Pyogenic arthritis of knee ([place]) 02/04/2016  . Renal disorder   . Sepsis ([place]) 01/12/2018  . Stroke [facility])      Past Surgical History: Past Surgical History:  Procedure Laterality Date  . ABDOMINAL SURGERY    . AMPUTATION FINGER Left 06/2019   [facility]  . ANGIOPLASTY Left    left fem[facility]pop at [facility] 04-11-2018  . BELOW KNEE  LEG AMPUTATION Right 08/2017   [facility]  . COLONOSCOPY    . COLONOSCOPY WITH PROPOFOL N/A 10/28/2020   Procedure: COLONOSCOPY WITH PROPOFOL;  Surgeon: Lin Landsman, MD;  Location: [facility] ENDOSCOPY;  Service: Gastroenterology;  Laterality: N/A;  . COLONOSCOPY WITH PROPOFOL N/A 11/21/2020   Procedure: COLONOSCOPY WITH PROPOFOL;  Surgeon: [name], MD;  Location: [facility] ENDOSCOPY;  Service: Endoscopy;  Laterality: N/A;  . DIALYSIS/PERMA CATHETER INSERTION N/A 12/09/2017   Procedure: DIALYSIS/PERMA CATHETER INSERTION;  Surgeon: Katha Cabal, MD;  Location: [place] CV LAB;  Service: Cardiovascular;  Laterality: N/A;  . DIALYSIS/PERMA CATHETER INSERTION N/A 12/12/2017   Procedure: DIALYSIS/PERMA CATHETER INSERTION;  Surgeon: Algernon Huxley, MD;  Location: [place] CV LAB;  Service: Cardiovascular;  Laterality: N/A;  . DIALYSIS/PERMA CATHETER REMOVAL Left 12/09/2017   Procedure: DIALYSIS/PERMA CATHETER REMOVAL;  Surgeon: Katha Cabal, MD;  Location: [place] CV LAB;  Service: Cardiovascular;  Laterality: Left;  . ESOPHAGOGASTRODUODENOSCOPY (EGD) WITH PROPOFOL N/A 11/20/2020   Procedure: ESOPHAGOGASTRODUODENOSCOPY (EGD) WITH PROPOFOL;  Surgeon: [name], MD;  Location: [facility] ENDOSCOPY;  Service: Endoscopy;  Laterality: N/A;  . KNEE SURGERY Left 02/04/2016   [facility]  . LEG AMPUTATION THROUGH LOWER TIBIA AND FIBULA Left 06/22/2018   [facility]  . LOWER EXTREMITY ANGIOGRAPHY Right 08/08/2017   Procedure: Lower Extremity Angiography;  Surgeon: Algernon Huxley, MD;  Location: [place] CV LAB;  Service: Cardiovascular;  Laterality: Right;  . LOWER EXTREMITY ANGIOGRAPHY Right 08/22/2017   Procedure:  Lower Extremity Angiography;  Surgeon: Algernon Huxley, MD;  Location: [place] CV LAB;  Service: Cardiovascular;  Laterality: Right;  . LOWER EXTREMITY INTERVENTION  08/08/2017   Procedure: LOWER EXTREMITY INTERVENTION;  Surgeon: Algernon Huxley, MD;  Location: [place] CV LAB;  Service:  Cardiovascular;;  . LOWER EXTREMITY INTERVENTION  08/22/2017   Procedure: LOWER EXTREMITY INTERVENTION;  Surgeon: Algernon Huxley, MD;  Location: [place] CV LAB;  Service: Cardiovascular;;    Home Medications: Prior to Admission medications   Medication Sig Start Date End Date Taking? Authorizing Provider  acetaminophen (TYLENOL) 500 MG tablet Take 1,000 mg by mouth daily as needed for moderate pain or headache.    Yes [provider]  Adalimumab ([name] PEN) 40 MG/0.4ML PNKT Inject 40 mg into the muscle once a week.  11/27/18  Yes [provider]  amLODipine (NORVASC) 10 MG tablet TAKE 1 TABLET(10 MG) BY MOUTH DAILY AS NEEDED Patient taking differently: Take 10 mg by mouth at bedtime. 11/11/20  Yes [name], MD  atorvastatin (LIPITOR) 80 MG tablet TAKE 1 TABLET(80 MG) BY MOUTH DAILY Patient taking differently: Take 80 mg by mouth daily. 06/25/20  Yes [name], MD  AURYXIA 1 GM 210 MG(Fe) tablet Take 420 mg by mouth in the morning and at bedtime.  10/23/18  Yes [provider]  carvedilol (COREG) 25 MG tablet Take 1 tablet (25 mg total) by mouth 2 (two) times daily. 06/27/20  Yes [name], MD  furosemide (LASIX) 80 MG tablet TAKE 1 TABLET(80 MG) BY MOUTH TWICE DAILY Patient taking differently: Take 80 mg by mouth 2 (two) times daily. 11/04/20  Yes [name], MD  gabapentin (NEURONTIN) 100 MG capsule Take 100 mg by mouth at bedtime. Take along with 32m capsule at bedtime   Yes [provider]  gabapentin (NEURONTIN) 300 MG capsule Take 1 capsule (300 mg total) by mouth at bedtime. 09/14/20  Yes GMercy Riding MD  hydrALAZINE (APRESOLINE) 50 MG tablet Take 1 tablet (50 mg total) by mouth every 8 (eight) hours. 01/20/21  Yes FBirdie Sons MD  levETIRAcetam (KEPPRA) 500 MG tablet Take 1 tablet (500 mg total) by mouth daily AND 1 tablet (500 mg total) every Monday, Wednesday, and Friday. After dialysis. 09/14/20  Yes GMercy Riding MD   lisinopril (ZESTRIL) 40 MG tablet Take 40 mg by mouth daily. 12/17/20  Yes [provider]  sevelamer carbonate (RENVELA) 800 MG tablet TAKE 1 TABLET(800 MG) BY MOUTH THREE TIMES DAILY Patient taking differently: Take 800 mg by mouth 3 (three) times daily with meals. 09/22/20  Yes FBirdie Sons MD  spironolactone (ALDACTONE) 25 MG tablet Take 1 tablet by mouth at bedtime. 12/17/20 12/17/21 Yes [provider]  Alcohol Swabs PADS Use as directed to check blood sugar three times daily for insulin dependent type 2 diabetes. 10/20/17   FBirdie Sons MD  amoxicillin-clavulanate (AUGMENTIN) 875-125 MG tablet Take twice daily as needed for 2 weeks for flares Patient not taking: No sig reported 01/06/21   [provider]  Blood Glucose Monitoring Suppl (ONE TOUCH ULTRA 2) w/Device KIT Use as directed to check blood sugar three times daily. E11.9 02/20/18   FBirdie Sons MD  cyanocobalamin (,VITAMIN B-12,) 1000 MCG/ML injection Inject 133monce a week for 4 weeks and then once a month for 3 months 09/23/20   VaLin LandsmanMD    Allergies: Allergies  Allergen Reactions  . Methotrexate  Other (See Comments)    Blood count drops  . Vancomycin Shortness Of Breath    Eyes watering, SOB, wheezing  . Cefepime Other (See Comments)  . Tape     Social History:  reports that he has quit smoking. He has never used smokeless tobacco. He reports current drug use. Drug: Marijuana. He reports that he does not drink alcohol.   Family History: Family History  Problem Relation Age of Onset  . Irritable bowel syndrome Sister   . Diabetes Sister   . Heart disease Mother   . Diabetes Mother   . Heart disease Father   . Rheumatic fever Father        as child  . Psoriasis Brother   . Arthritis Brother   . Diabetes Sister   . Diabetes Sister     Review of Systems: Review of Systems  Constitutional: Negative for chills and fever.  HENT: Negative for hearing loss.    Respiratory: Negative for shortness of breath.   Cardiovascular: Negative for chest pain.  Gastrointestinal: Negative for abdominal pain, nausea and vomiting.  Genitourinary: Negative for dysuria.  Musculoskeletal: Negative for myalgias.  Skin: Negative for rash.       Hydradenitis abscess.  Neurological: Negative for dizziness.  Psychiatric/Behavioral: Negative for depression.    Physical Exam BP (!) 159/71 (BP Location: Right Arm)   Pulse 60   Temp 98.9 F (37.2 C) (Oral)   Resp 18   Ht _0  (1.803 m)   Wt 67.6 kg   SpO2 94%   BMI 20.79 kg/m  CONSTITUTIONAL: No acute distress HEENT:  Normocephalic, atraumatic, extraocular motion intact. NECK: Trachea is midline, and there is no jugular venous distension. RESPIRATORY:  Lungs are clear, and breath sounds are equal bilaterally. Normal respiratory effort without pathologic use of accessory muscles. CARDIOVASCULAR: Heart is regular without murmurs, gallops, or rubs. GI: The abdomen is soft, non-distended, non-tender.  MUSCULOSKELETAL:  Bilateral BKA but is able to rotate in bed well by himself. SKIN: Patient has a small opening in the skin of the left buttocks with purulent drainage.  There is not much induration or cellulitis in this area, and only some firmness that extends about 2 cm.  He has scars in the perianal area consistent with prior I&D procedures.  NEUROLOGIC:  Motor and sensation is grossly normal.  Cranial nerves are grossly intact. PSYCH:  Alert and oriented to person, place and time. Affect is normal.  Laboratory Analysis: Results for orders placed or performed during the hospital encounter of 02/06/21 (from the past 24 hour(s))  Glucose, capillary     Status: Abnormal   Collection Time: 02/06/21 11:46 PM  Result Value Ref Range   Glucose-Capillary 256 (H) 70 - 99 mg/dL  Protime-INR     Status: Abnormal   Collection Time: 02/07/21  4:14 AM  Result Value Ref Range   Prothrombin Time 15.3 (H) 11.4 - 15.2  seconds   INR 1.2 0.8 - 1.2  Cortisol-am, blood     Status: None   Collection Time: 02/07/21  4:14 AM  Result Value Ref Range   Cortisol - AM 15.2 6.7 - 22.6 ug/dL  Procalcitonin     Status: None   Collection Time: 02/07/21  4:14 AM  Result Value Ref Range   Procalcitonin 33.53 ng/mL  Basic metabolic panel     Status: Abnormal   Collection Time: 02/07/21  4:14 AM  Result Value Ref Range   Sodium 137 135 - 145 mmol/L  Potassium 2.7 (LL) 3.5 - 5.1 mmol/L   Chloride 93 (L) 98 - 111 mmol/L   CO2 28 22 - 32 mmol/L   Glucose, Bld 262 (H) 70 - 99 mg/dL   BUN 25 (H) 6 - 20 mg/dL   Creatinine, Ser 4.77 (H) 0.61 - 1.24 mg/dL   Calcium 8.7 (L) 8.9 - 10.3 mg/dL   GFR, Estimated 13 (L) >60 mL/min   Anion gap 16 (H) 5 - 15  CBC     Status: Abnormal   Collection Time: 02/07/21  4:14 AM  Result Value Ref Range   WBC 14.0 (H) 4.0 - 10.5 K/uL   RBC 3.32 (L) 4.22 - 5.81 MIL/uL   Hemoglobin 8.8 (L) 13.0 - 17.0 g/dL   HCT 28.4 (L) 39.0 - 52.0 %   MCV 85.5 80.0 - 100.0 fL   MCH 26.5 26.0 - 34.0 pg   MCHC 31.0 30.0 - 36.0 g/dL   RDW 21.0 (H) 11.5 - 15.5 %   Platelets 119 (L) 150 - 400 K/uL   nRBC 0.0 0.0 - 0.2 %  Magnesium     Status: Abnormal   Collection Time: 02/07/21  4:14 AM  Result Value Ref Range   Magnesium 1.6 (L) 1.7 - 2.4 mg/dL  Glucose, capillary     Status: Abnormal   Collection Time: 02/07/21  8:01 AM  Result Value Ref Range   Glucose-Capillary 219 (H) 70 - 99 mg/dL  Potassium     Status: None   Collection Time: 02/07/21 10:45 AM  Result Value Ref Range   Potassium 3.9 3.5 - 5.1 mmol/L  Glucose, capillary     Status: Abnormal   Collection Time: 02/07/21 11:50 AM  Result Value Ref Range   Glucose-Capillary 129 (H) 70 - 99 mg/dL  Glucose, capillary     Status: Abnormal   Collection Time: 02/07/21  4:34 PM  Result Value Ref Range   Glucose-Capillary 150 (H) 70 - 99 mg/dL  Glucose, capillary     Status: Abnormal   Collection Time: 02/07/21  9:01 PM  Result Value Ref  Range   Glucose-Capillary 130 (H) 70 - 99 mg/dL    Imaging: No results found.  Assessment and Plan: This is a 60 y.o. male admitted with AMS, found to have small left buttocks abscess.  --Discussed with the patient and his wife that his abscess is small, though still drains purulent drainage because of the small exit site on the skin.  I think it would be best if we could I&D the area to get a bigger opening and let this drain better and heal.  However, it would mean making the wound bigger and needing packing of the wound with 1/4 or 1/2 inch gauze.  The patient has had this before and he is not too keen right now about doing procedures. --He's not toxic/septic, and do not think this was the source of his AMS. Given this, I think it's reasonable to wait until tomorrow so the patient can think whether he wants an I&D or not.   --Will see him again in the morning.  In the meantime, continue antibiotic management.  OK to use foam dressing to catch drainage while keeping less pressure on the wound.  Face-to-face time spent with the patient and care providers was 55 minutes, with more than 50% of the time spent counseling, educating, and coordinating care of the patient.     Melvyn Neth, MD [facility] Pg:  [phone]

## 2021-02-07 NOTE — Progress Notes (Signed)
Patients potassium is 2.7. B Morrison notified.

## 2021-02-07 NOTE — Progress Notes (Signed)
[name]  MRN: [id number]  DOB/AGE: 06/01/1961 60 y.o.  Primary Care Physician:[name], [name], MD  Admit date: 02/06/2021  Chief Complaint:  Chief Complaint  Patient presents with  . Altered Mental Status    S-Pt presented on  02/06/2021 with  Chief Complaint  Patient presents with  . Altered Mental Status   Patient is known to our group from previous admission in 2020 and 2022.  Patient is a 60 year old African-American male with a past medical history of diabetes mellitus, hypertension, end-stage renal disease on hemodialysis, Seizure disorder [name] is a 60 y.o. male with medical history significant for DM, HTN, ESRD on HD MWF, seizure disorder, CVA, hydradenitis suppurativa and Cutaneous Crohn's on [name] and as needed Augmentin for flareups, followed by [facility] dermatology, status post recent hospitalization for GI bleed, presenting with altered mental status/possible seizure, who was brought to the emergency room with altered mental status and confusion following dialysis yesterday.    Upon evaluation in the ER patient was found to be febrile with elevated WBC count of 18 K.  On examination patient was found to have aching and throbbing of a chronic wound of the left buttock related to his hidradenitis which has had a persistent drainage.  Patient was admitted with possible sepsis from cellulitis of his right buttock Nephrology was consulted for comanagement of dialysis patient Patient was seen today on second floor.  Patient offers no new specific physical complaints No complaint of fever/cough or chills No complaint of recent COVID exposure No complaint of nausea vomiting diarrhea Patient main concern in today's visit was he got different food than what he had ordered..     Medications . heparin  5,000 Units Subcutaneous Q8H  .  HYDROmorphone (DILAUDID) injection  0.5 mg Intravenous Once  . insulin aspart  0-5 Units Subcutaneous QHS  . insulin aspart  0-9  Units Subcutaneous TID WC  . levETIRAcetam  500 mg Oral BID         HTD:SKAJG from the symptoms mentioned above,there are no other symptoms referable to all systems reviewed.  Physical Exam: Vital signs in last 24 hours: Temp:  [98.2 F (36.8 C)-101.7 F (38.7 C)] 98.6 F (37 C) (04/23 0839) Pulse Rate:  [66-82] 69 (04/23 0839) Resp:  [15-24] 18 (04/23 0839) BP: (143-188)/(65-85) 143/79 (04/23 0839) SpO2:  [97 %-100 %] 99 % (04/23 0839) Weight:  [67.6 kg] 67.6 kg (04/22 1733) Weight change:     Intake/Output from previous day: 04/22 0701 - 04/23 0700 In: 159.4 [I.V.:59.4; IV Piggyback:100] Out: 20 [Urine:20] Total I/O In: 120 [P.O.:120] Out: -    Physical Exam:  General- pt is awake,alert, following commands  Resp- No acute REsp distress,L NO Rhonchi  CVS- S1S2 regular in rate and rhythm  GIT- BS+, soft, Non tender , Non distended  EXT- No LE Edema,  No Cyanosis  Access- LUE AVG   Lab Results:  CBC  Recent Labs    02/06/21 1756 02/07/21 0414  WBC 18.4* 14.0*  HGB 9.2* 8.8*  HCT 29.7* 28.4*  PLT 131* 119*    BMET  Recent Labs    02/06/21 1756 02/07/21 0414  NA 139 137  K 3.0* 2.7*  CL 97* 93*  CO2 29 28  GLUCOSE 260* 262*  BUN 19 25*  CREATININE 3.76* 4.77*  CALCIUM 8.7* 8.7*      Most recent Creatinine trend  Lab Results  Component Value Date   CREATININE 4.77 (H) 02/07/2021   CREATININE 3.76 (  H) 02/06/2021   CREATININE 5.37 (H) 12/25/2020      MICRO   Recent Results (from the past 240 hour(s))  Blood culture (single)     Status: None (Preliminary result)   Collection Time: 02/06/21  5:49 PM   Specimen: BLOOD  Result Value Ref Range Status   Specimen Description BLOOD RIGHT FA  Final   Special Requests   Final    BOTTLES DRAWN AEROBIC AND ANAEROBIC Blood Culture adequate volume   Culture   Final    NO GROWTH < 12 HOURS Performed at [facility], [address], [place], [place] [address]    Report Status  PENDING  Incomplete  Resp Panel by RT-PCR (Flu A&B, Covid) Nasopharyngeal Swab     Status: None   Collection Time: 02/06/21  6:45 PM   Specimen: Nasopharyngeal Swab; Nasopharyngeal(NP) swabs in vial transport medium  Result Value Ref Range Status   SARS Coronavirus 2 by RT PCR NEGATIVE NEGATIVE Final    Comment: (NOTE) SARS-CoV-2 target nucleic acids are NOT DETECTED.  The SARS-CoV-2 RNA is generally detectable in upper respiratory specimens during the acute phase of infection. The lowest concentration of SARS-CoV-2 viral copies this assay can detect is 138 copies/mL. A negative result does not preclude SARS-Cov-2 infection and should not be used as the sole basis for treatment or other patient management decisions. A negative result may occur with  improper specimen collection/handling, submission of specimen other than nasopharyngeal swab, presence of viral mutation(s) within the areas targeted by this assay, and inadequate number of viral copies(<138 copies/mL). A negative result must be combined with clinical observations, patient history, and epidemiological information. The expected result is Negative.  Fact Sheet for Patients:  EntrepreneurPulse.com.au  Fact Sheet for Healthcare Providers:  IncredibleEmployment.be  This test is no t yet approved or cleared by the [place] FDA and  has been authorized for detection and/or diagnosis of SARS-CoV-2 by FDA under an Emergency Use Authorization (EUA). This EUA will remain  in effect (meaning this test can be used) for the duration of the COVID-19 declaration under Section 564(b)(1) of the Act, 21 U.S.C.section 360bbb-3(b)(1), unless the authorization is terminated  or revoked sooner.       Influenza A by PCR NEGATIVE NEGATIVE Final   Influenza B by PCR NEGATIVE NEGATIVE Final    Comment: (NOTE) The Xpert Xpress SARS-CoV-2/FLU/RSV plus assay is intended as an aid in the diagnosis of  influenza from Nasopharyngeal swab specimens and should not be used as a sole basis for treatment. Nasal washings and aspirates are unacceptable for Xpert Xpress SARS-CoV-2/FLU/RSV testing.  Fact Sheet for Patients: EntrepreneurPulse.com.au  Fact Sheet for Healthcare Providers: IncredibleEmployment.be  This test is not yet approved or cleared by the [place] FDA and has been authorized for detection and/or diagnosis of SARS-CoV-2 by FDA under an Emergency Use Authorization (EUA). This EUA will remain in effect (meaning this test can be used) for the duration of the COVID-19 declaration under Section 564(b)(1) of the Act, 21 U.S.C. section 360bbb-3(b)(1), unless the authorization is terminated or revoked.  Performed at [facility], [address] [place], [place] [address]          Impression:   60 year old male with history of DM, HTN, ESRD on HD, seizure disorder, CVA, hydradenitis suppurativa and cutaneous Crohn's on [name] and as needed Augmentin for flareups, followed by [facility] dermatology, status post recent hospitalization for GI bleed, presenting with altered mental status/possible seizure, who was brought to the  emergency room with altered mental status and confusion following dialysis earlier in the day.  In ER, febrile with leukocytosis of 18,000.  1)Renal    End-stage renal disease Patient is on hemodialysis Patient is on Monday Wednesday Friday schedule Patient was last dialyzed yesterday No need for renal replacement therapy today  2)HTN    Blood pressure is stable    3)Anemia of chronic disease  CBC Latest Ref Rng & Units 02/07/2021 02/06/2021 12/25/2020  WBC 4.0 - 10.5 K/uL 14.0(H) 18.4(H) 9.9  Hemoglobin 13.0 - 17.0 g/dL 8.8(L) 9.2(L) 6.9(L)  Hematocrit 39.0 - 52.0 % 28.4(L) 29.7(L) 21.9(L)  Platelets 150 - 400 K/uL 119(L) 131(L) 105(L)       HGb is not at goal (9--11) Patient is on Epogen  protocol  4) Secondary hyperparathyroidism -CKD Mineral-Bone Disorder    Lab Results  Component Value Date   PTH 283 (H) 01/08/2019   CALCIUM 8.7 (L) 02/07/2021   CAION 1.10 (L) 09/12/2020   PHOS 3.7 11/21/2020    Secondary Hyperparathyroidism present Phosphorus at goal.   5)Sepsis secondary to right buttock cellulitis Patient is on antibiotics Primary team is following  6) Electrolytes   BMP Latest Ref Rng & Units 02/07/2021 02/06/2021 12/25/2020  Glucose 70 - 99 mg/dL 262(H) 260(H) 337(H)  BUN 6 - 20 mg/dL 25(H) 19 33(H)  Creatinine 0.61 - 1.24 mg/dL 4.77(H) 3.76(H) 5.37(H)  BUN/Creat Ratio 9 - 20 - - -  Sodium 135 - 145 mmol/L 137 139 137  Potassium 3.5 - 5.1 mmol/L 2.7(LL) 3.0(L) 3.9  Chloride 98 - 111 mmol/L 93(L) 97(L) 97(L)  CO2 22 - 32 mmol/L 28 29 29   Calcium 8.9 - 10.3 mg/dL 8.7(L) 8.7(L) 8.8(L)     Sodium Normonatremic   Potassium Hypokalemic Being replete, patient did get 40 mEq of p.o. KCl today   7)Acid base    Co2 at goal     Plan:   No need for renal replacement therapy today We will continue to follow     Idan Prime s [facility] 02/07/2021, 10:12 AM

## 2021-02-08 DIAGNOSIS — L03317 Cellulitis of buttock: Secondary | ICD-10-CM

## 2021-02-08 DIAGNOSIS — B9689 Other specified bacterial agents as the cause of diseases classified elsewhere: Secondary | ICD-10-CM | POA: Diagnosis not present

## 2021-02-08 DIAGNOSIS — D631 Anemia in chronic kidney disease: Secondary | ICD-10-CM

## 2021-02-08 DIAGNOSIS — L0231 Cutaneous abscess of buttock: Secondary | ICD-10-CM | POA: Diagnosis not present

## 2021-02-08 DIAGNOSIS — Z8673 Personal history of transient ischemic attack (TIA), and cerebral infarction without residual deficits: Secondary | ICD-10-CM

## 2021-02-08 LAB — URINE CULTURE: Culture: <10000 — AB

## 2021-02-08 LAB — GLUCOSE, CAPILLARY
Glucose-Capillary: 151 mg/dL — ABNORMAL HIGH (ref 70–99)
Glucose-Capillary: 101 mg/dL — ABNORMAL HIGH (ref 70–99)
Glucose-Capillary: 179 mg/dL — ABNORMAL HIGH (ref 70–99)
Glucose-Capillary: 170 mg/dL — ABNORMAL HIGH (ref 70–99)

## 2021-02-08 LAB — BASIC METABOLIC PANEL
Anion gap: 14 (ref 5–15)
BUN: 33 mg/dL — ABNORMAL HIGH (ref 6–20)
CO2: 25 mmol/L (ref 22–32)
Calcium: 8.6 mg/dL — ABNORMAL LOW (ref 8.9–10.3)
Chloride: 95 mmol/L — ABNORMAL LOW (ref 98–111)
Creatinine, Ser: 6.26 mg/dL — ABNORMAL HIGH (ref 0.61–1.24)
GFR, Estimated: 10 mL/min — ABNORMAL LOW (ref >60)
Glucose, Bld: 152 mg/dL — ABNORMAL HIGH (ref 70–99)
Potassium: 4.4 mmol/L (ref 3.5–5.1)
Sodium: 134 mmol/L — ABNORMAL LOW (ref 135–145)

## 2021-02-08 LAB — CBC
HCT: 28.2 % — ABNORMAL LOW (ref 39.0–52.0)
Hemoglobin: 8.9 g/dL — ABNORMAL LOW (ref 13.0–17.0)
MCH: 26.6 pg (ref 26.0–34.0)
MCHC: 31.6 g/dL (ref 30.0–36.0)
MCV: 84.4 fL (ref 80.0–100.0)
Platelets: 130 10*3/uL — ABNORMAL LOW (ref 150–400)
RBC: 3.34 MIL/uL — ABNORMAL LOW (ref 4.22–5.81)
RDW: 20.7 % — ABNORMAL HIGH (ref 11.5–15.5)
WBC: 8.7 10*3/uL (ref 4.0–10.5)
nRBC: 0 % (ref 0.0–0.2)

## 2021-02-08 MED ORDER — CHLORHEXIDINE GLUCONATE CLOTH 2 % EX PADS
6.0000 | MEDICATED_PAD | Freq: Every day | CUTANEOUS | Status: DC
  Administered 2021-02-09 – 2021-02-10 (×2): 6 via TOPICAL

## 2021-02-08 MED ORDER — LIDOCAINE-EPINEPHRINE (PF) 1.5 %-1:200000 IJ SOLN
10.0000 mL | Freq: Once | INTRAMUSCULAR | Status: AC
  Administered 2021-02-08: 10 mL via INTRADERMAL
  Filled 2021-02-08: qty 10

## 2021-02-08 MED ORDER — EPOETIN ALFA 10000 UNIT/ML IJ SOLN
10000.0000 [IU] | INTRAMUSCULAR | Status: DC
  Administered 2021-02-09: 10000 [IU] via INTRAVENOUS
  Filled 2021-02-08: qty 1

## 2021-02-08 NOTE — Evaluation (Signed)
Physical Therapy Evaluation Patient Details Name: [name] MRN: [id number] DOB: [birth year] Today's Date: 02/08/2021   History of Present Illness  [name] is a 60 y.o. male with medical history significant for DM, HTN, ESRD on HD MWF, seizure disorder, CVA, hydradenitis suppurativa and Cutaneous Crohn's on [name] and as needed Augmentin for flareups, followed by [facility] dermatology, status post recent hospitalization for GI bleed, presenting with altered mental status/possible seizure, who was brought to the emergency room with altered mental status and confusion following dialysis.  Clinical Impression  Pt seen for PT evaluation with co-tx with OT as nursing staff reporting pt required +3 assist this morning, but pt actually able to complete bed mobility without physical assistance, don BLE prosthesis independently & ambulate 55 ft with RW & min assist. Pt does demonstrate slow, deliberate movements, requiring increased time but appears to be aware of his deficits in regards to balance & gait. Will continue to see pt acutely to focus on activity tolerance & progress gait.     Follow Up Recommendations Home health PT;Supervision/Assistance - 24 hour    Equipment Recommendations  None recommended by PT (pt already has RW & w/c)    Recommendations for Other Services       Precautions / Restrictions Precautions Precautions: Fall Restrictions Weight Bearing Restrictions: No Other Position/Activity Restrictions: BLE prosthesis (hx of B BKA)      Mobility  Bed Mobility Overal bed mobility: Needs Assistance Bed Mobility: Supine to Sit     Supine to sit: Supervision;HOB elevated     General bed mobility comments: extra time, use of bed rails, slow movement    Transfers Overall transfer level: Needs assistance   Transfers: Sit to/from Stand Sit to Stand: Min guard         General transfer comment: STS from low EOB with BLE prosthesis &  RW  Ambulation/Gait Ambulation/Gait assistance: Min guard Gait Distance (Feet): 55 Feet Assistive device: Rolling walker (2 wheeled) Gait Pattern/deviations: Decreased step length - right;Decreased step length - left;Decreased stride length Gait velocity: decreased   General Gait Details: forward trunk lean on RW, after gait PT adjusted height of RW for more upright posture, cuing for upright posture to allow increased BLE (L>R) foot clearance as LLE prosthetic foot dragged at times  Stairs            Wheelchair Mobility    Modified Rankin (Stroke Patients Only)       Balance Overall balance assessment: Needs assistance Sitting-balance support: No upper extremity supported;Feet supported Sitting balance-Leahy Scale: Good     Standing balance support: During functional activity;No upper extremity supported Standing balance-Leahy Scale: Poor Standing balance comment: min assist when standing without BUE support to don mask                             Pertinent Vitals/Pain Pain Assessment: No/denies pain    Home Living Family/patient expects to be discharged to:: Private residence Living Arrangements: Spouse/significant other Available Help at Discharge: Family;Available PRN/intermittently Type of Home: House Home Access: Ramped entrance     Home Layout: One level Home Equipment: Tub bench;Walker - 2 wheels;Crutches;Bedside commode      Prior Function Level of Independence: Independent with assistive device(s)         Comments: Independent with RW for household distances, alos uses w/c     Hand Dominance        Extremity/Trunk Assessment  Lower Extremity Assessment Lower Extremity Assessment: Generalized weakness (hx of B BKA (pt reports 2 years ago), pt able to don BLE prosthesis without issue)       Communication   Communication: No difficulties  Cognition Arousal/Alertness: Awake/alert Behavior During Therapy: WFL for  tasks assessed/performed Overall Cognitive Status: Within Functional Limits for tasks assessed                                 General Comments: Pleasant gentleman, appreciative of PT/OT intervention, AxO x 4      General Comments      Exercises     Assessment/Plan    PT Assessment Patient needs continued PT services  PT Problem List Decreased strength;Decreased mobility;Decreased activity tolerance;Decreased balance       PT Treatment Interventions DME instruction;Therapeutic activities;Gait training;Therapeutic exercise;Patient/family education;Stair training;Balance training;Wheelchair mobility training;Functional mobility training;Neuromuscular re-education;Manual techniques    PT Goals (Current goals can be found in the Care Plan section)  Acute Rehab PT Goals Patient Stated Goal: get bettter PT Goal Formulation: With patient Time For Goal Achievement: 02/22/21 Potential to Achieve Goals: Good    Frequency Min 2X/week   Barriers to discharge        Co-evaluation PT/OT/SLP Co-Evaluation/Treatment: Yes Reason for Co-Treatment: For patient/therapist safety;To address functional/ADL transfers (nurse reporting pt required +3 assist this morning but actually did well during session) PT goals addressed during session: Mobility/safety with mobility;Balance;Proper use of DME         AM-PAC PT "6 Clicks" Mobility  Outcome Measure Help needed turning from your back to your side while in a flat bed without using bedrails?: None Help needed moving from lying on your back to sitting on the side of a flat bed without using bedrails?: A Little Help needed moving to and from a bed to a chair (including a wheelchair)?: A Little Help needed standing up from a chair using your arms (e.g., wheelchair or bedside chair)?: A Little Help needed to walk in hospital room?: A Little Help needed climbing 3-5 steps with a railing? : A Lot 6 Click Score: 18    End of Session  Equipment Utilized During Treatment: Gait belt (BLE prosthesis)   Patient left:  (sitting EOB in care of OT) Nurse Communication: Mobility status PT Visit Diagnosis: Muscle weakness (generalized) (M62.81);Other abnormalities of gait and mobility (R26.89)    Time: 6384-5364 PT Time Calculation (min) (ACUTE ONLY): 20 min   Charges:   PT Evaluation $PT Eval Low Complexity: [place], PT, DPT 02/08/21, 12:02 PM   Waunita Schooner 02/08/2021, 12:01 PM

## 2021-02-08 NOTE — Evaluation (Signed)
Occupational Therapy Evaluation Patient Details Name: [name] MRN: [id number] DOB: 10-17-1961 Today's Date: 02/08/2021    History of Present Illness [name] is a 60 y.o. male with medical history significant for b/l BKA, DM, HTN, ESRD on HD MWF, seizure disorder, CVA, hydradenitis suppurativa and Cutaneous Crohn's on [name] and as needed Augmentin for flareups, followed by [facility] dermatology, status post recent hospitalization for GI bleed, presenting with altered mental status/possible seizure, who was brought to the emergency room with altered mental status and confusion following dialysis.   Clinical Impression   Pt seen for OT/PT co-evaluation this date (RN reporting pt required +3 assist for functional mobility this AM, however pt MIN Ax1 throughout OT/PT eval). Pt A&Ox4 and reporting no pain. Prior to admission, pt was mod-independent in all ADLs and functional mobility with RW and b/l LE protheses, living in a 1 story home with wife. Pt currently presents with decreased strength, balance, and activity tolerance, and requires SUPERVISION to don/doff protheses, MIN A for functional mobility of short household distances with RW (~39f), and MIN A for standing grooming at sink-side. Pt would benefit from additional skilled OT services to improve activity tolerance, strength, and balance, and maximize return to PLOF. Upon discharge, recommend [employer].     Follow Up Recommendations  Home health OT;Supervision - Intermittent    Equipment Recommendations  None recommended by OT       Precautions / Restrictions Precautions Precautions: Fall Restrictions Weight Bearing Restrictions: No Other Position/Activity Restrictions: BLE prosthesis (hx of B BKA)      Mobility Bed Mobility Overal bed mobility: Needs Assistance Bed Mobility: Supine to Sit     Supine to sit: Supervision;HOB elevated     General bed mobility comments: extra time, use of bed rails, slow movement     Transfers Overall transfer level: Needs assistance Equipment used: Rolling walker (2 wheeled) Transfers: Sit to/from Stand Sit to Stand: Min guard         General transfer comment: STS from low EOB with BLE prosthesis & RW    Balance Overall balance assessment: Needs assistance Sitting-balance support: No upper extremity supported;Feet supported Sitting balance-Leahy Scale: Good Sitting balance - Comments: Good sitting balance at EOB during LB dressing   Standing balance support: During functional activity;No upper extremity supported Standing balance-Leahy Scale: Poor Standing balance comment: min assist when standing without BUE support on RW during standing ADLs                           ADL either performed or assessed with clinical judgement   ADL Overall ADL's : Needs assistance/impaired     Grooming: Wash/dry face;Oral care;Minimal assistance;Standing Grooming Details (indicate cue type and reason): MIN A for steadying             Lower Body Dressing: Supervision/safety;Set up;Sit to/from stand Lower Body Dressing Details (indicate cue type and reason): to don/doff protheses             Functional mobility during ADLs: Minimal assistance;Rolling walker                    Pertinent Vitals/Pain Pain Assessment: No/denies pain        Extremity/Trunk Assessment Upper Extremity Assessment Upper Extremity Assessment: Generalized weakness   Lower Extremity Assessment Lower Extremity Assessment: Generalized weakness (hx of B BKA (pt reports 2 years ago), pt able to don BLE prosthesis without issue)   Cervical /  Trunk Assessment Cervical / Trunk Assessment: Normal   Communication Communication Communication: No difficulties   Cognition Arousal/Alertness: Awake/alert Behavior During Therapy: WFL for tasks assessed/performed Overall Cognitive Status: Within Functional Limits for tasks assessed                                  General Comments: Pleasant and agreeable throughout. A&Ox4              Home Living Family/patient expects to be discharged to:: Private residence Living Arrangements: Spouse/significant other Available Help at Discharge: Family;Available PRN/intermittently Type of Home: House Home Access: Ramped entrance     Home Layout: One level     Bathroom Shower/Tub: Tub/shower unit         Home Equipment: Tub bench;Walker - 2 wheels;Crutches;Bedside commode          Prior Functioning/Environment Level of Independence: Independent with assistive device(s)        Comments: Independent with RW for household distances, also uses w/c. Independent with ADLs. Family assists with IADLs        OT Problem List: Decreased strength;Decreased activity tolerance;Impaired balance (sitting and/or standing)      OT Treatment/Interventions: Self-care/ADL training;Therapeutic exercise;Energy conservation;DME and/or AE instruction;Therapeutic activities;Patient/family education;Balance training    OT Goals(Current goals can be found in the care plan section) Acute Rehab OT Goals Patient Stated Goal: to go home OT Goal Formulation: With patient Time For Goal Achievement: 02/22/21 Potential to Achieve Goals: Good ADL Goals Pt Will Perform Grooming: with set-up;with supervision;standing Pt Will Transfer to Toilet: with supervision;ambulating;regular height toilet Pt Will Perform Toileting - Clothing Manipulation and hygiene: with modified independence;sitting/lateral leans  OT Frequency: Min 1X/week           Co-evaluation PT/OT/SLP Co-Evaluation/Treatment: Yes Reason for Co-Treatment: Complexity of the patient's impairments (multi-system involvement);Necessary to address cognition/behavior during functional activity;Other (comment) (nurse reporting pt required +3 assist this morning but actually did well during session) PT goals addressed during session: Mobility/safety with  mobility;Balance;Proper use of DME OT goals addressed during session: ADL's and self-care;Proper use of Adaptive equipment and DME      AM-PAC OT "6 Clicks" Daily Activity     Outcome Measure Help from another person eating meals?: None Help from another person taking care of personal grooming?: A Little Help from another person toileting, which includes using toliet, bedpan, or urinal?: A Little Help from another person bathing (including washing, rinsing, drying)?: A Little Help from another person to put on and taking off regular upper body clothing?: None Help from another person to put on and taking off regular lower body clothing?: A Little 6 Click Score: 20   End of Session Equipment Utilized During Treatment: Gait belt;Rolling walker Nurse Communication: Mobility status  Activity Tolerance: Patient tolerated treatment well Patient left: in bed;with call bell/phone within reach;with bed alarm set;with nursing/sitter in room  OT Visit Diagnosis: Muscle weakness (generalized) (M62.81)                Time: 0973-5329 OT Time Calculation (min): 35 min Charges:  OT General Charges $OT Visit: 1 Visit OT Evaluation $OT Eval Moderate Complexity: 1 Mod OT Treatments $Self Care/Home Management : 8-22 mins  Fredirick Maudlin, OTR/L Branch

## 2021-02-08 NOTE — Op Note (Signed)
  Procedure Date:  02/08/2021  Pre-operative Diagnosis:  Left buttocks abscess  Post-operative Diagnosis:  Left buttocks abscess  Procedure:  Incision and Drainage of left buttocks abscess  Surgeon:  Melvyn Neth, MD  Anesthesia:  3 ml of 1.5% lidocaine with epi  Estimated Blood Loss:  5 ml  Specimens:  Culture swab  Complications:  None  Indications for Procedure:  This is a 60 y.o. male with diagnosis of a chronic left buttocks abscess, requiring drainage procedure.  The risks of bleeding, abscess or infection, injury to surrounding structures, and need for further procedures were all discussed with the patient and was willing to proceed.  Description of Procedure: The patient's left buttocks was prepped using betadine.  Local anesthetic was infused intradermally.  A cruciate 1 cm incision was made over the abscess, revealing bloody and purulent fluid.  This fluid was swabbed for culture and sent to micro.  Small Kelly forceps were used to dissect around the abscess tissue to open any remaining pockets of purulent fluid.  After drainage was completed, the cavity was irrigated and cleaned.  There was some oozing from the patient being on Plavix, which was controlled with manual pressure.  The wound was packed with 1/4 inch gauze and covered with dry gauze and tape.  The patient tolerated the procedure well and all sharps were appropriately disposed of at the end of the case.   Melvyn Neth, MD

## 2021-02-08 NOTE — Progress Notes (Signed)
02/08/21  I&D done today of left buttocks abscess.  Dressed with 1/4 inch gauze packing and 4x4 gauze, followed by Mepilex foam dressing.    Continue antibiotics, follow cultures, can follow as outpatient after being discharged either with me or with [facility] who already manages his hydradenitis.   Olean Ree, MD

## 2021-02-08 NOTE — Progress Notes (Signed)
[place] [facility] [place] NAME: [name]    MR#:  [id number]  DATE OF BIRTH:  November 26, 1960  SUBJECTIVE:  CHIEF COMPLAINT:   Chief Complaint  Patient presents with  . Altered Mental Status  Denies any new complaints.  Agreeable to get I&D REVIEW OF SYSTEMS:  Review of Systems  Constitutional: Positive for malaise/fatigue. Negative for diaphoresis, fever and weight loss.  HENT: Negative for ear discharge, ear pain, hearing loss, nosebleeds, sore throat and tinnitus.   Eyes: Negative for blurred vision and pain.  Respiratory: Negative for cough, hemoptysis, shortness of breath and wheezing.   Cardiovascular: Negative for chest pain, palpitations, orthopnea and leg swelling.  Gastrointestinal: Negative for abdominal pain, blood in stool, constipation, diarrhea, heartburn, nausea and vomiting.  Genitourinary: Negative for dysuria, frequency and urgency.  Musculoskeletal: Negative for back pain and myalgias.  Skin: Positive for rash. Negative for itching.  Neurological: Negative for dizziness, tingling, tremors, focal weakness, seizures, weakness and headaches.  Psychiatric/Behavioral: Negative for depression. The patient is not nervous/anxious.    DRUG ALLERGIES:   Allergies  Allergen Reactions  . Methotrexate Other (See Comments)    Blood count drops  . Vancomycin Shortness Of Breath    Eyes watering, SOB, wheezing  . Cefepime Other (See Comments)  . Tape    VITALS:  Blood pressure (!) 179/75, pulse 61, temperature 98.7 F (37.1 C), temperature source Oral, resp. rate 18, height 5' 11"  (1.803 m), weight 67.6 kg, SpO2 93 %. PHYSICAL EXAMINATION:  Physical Exam  60 year old male lying in the bed comfortably without any acute distress Cardiovascular S1-S2 normal, no murmur rales or gallop Lungs clear to auscultation bilaterally no wheezing rales rhonchi crepitation Abdomen soft nontender nondistended bowel sounds present Neurologically nonfocal exam.   He is slow in his responses likely mild cognitive issues Skin.  See picture   LABORATORY PANEL:  Male CBC Recent Labs  Lab 02/08/21 0450  WBC 8.7  HGB 8.9*  HCT 28.2*  PLT 130*   ------------------------------------------------------------------------------------------------------------------ Chemistries  Recent Labs  Lab 02/06/21 1756 02/07/21 0414 02/07/21 1045 02/08/21 0450  NA 139 137  --  134*  K 3.0* 2.7*   < > 4.4  CL 97* 93*  --  95*  CO2 29 28  --  25  GLUCOSE 260* 262*  --  152*  BUN 19 25*  --  33*  CREATININE 3.76* 4.77*  --  6.26*  CALCIUM 8.7* 8.7*  --  8.6*  MG  --  1.6*  --   --   AST 13*  --   --   --   ALT 8  --   --   --   ALKPHOS 64  --   --   --   BILITOT 0.9  --   --   --    < > = values in this interval not displayed.   RADIOLOGY:  No results found. ASSESSMENT AND PLAN:  60 y.o. male with medical history significant for DM, HTN, ESRD on HD MWF, seizure disorder, CVA, hydradenitis suppurativa and cutaneous Crohn's on [name] and as needed Augmentin for flareups, followed by [facility] dermatology, status post recent hospitalization for GI bleed admitted for altered mental status and confusion following dialysis earlier in the day concerning for seizure to his spouse    Acute metabolic encephalopathy present on admission and now resolved   Vascular dementia Breakthrough seizure - likely related to acute infection with underlying vascular dementia and multiple medical comorbidities  including ESRD - CT head showing no acute intracranial findings -  No seizure activity was witnessed -Seen by neurology and recommends to continue [place] as ordered for now. - Outpatient neurology follow-up for evaluation and management of vascular dementia    Sepsis secondary to cellulitis right buttock   Hidradenitis suppurativa   Long term current use of immunosuppressive drug/[name]   Disorder of skin due to Crohn's disease/cutaneous Crohn's - Patient has chronic  wound with drainage of left buttock related to his hidradenitis suppurativa and is followed by [facility] dermatology and takes Augmentin as needed for flareups - At high risk for infection due to being on long-term [name] - Presents with altered mental status, and had T-max of 101.7 with leukocytosis of 18,000 on admission  - Source suspected cellulitis/abscess related to wound on right buttock - CT abdomen and pelvis showed no acute process - Urinalysis only minimally abnormal -  Continue IV Zyvox (vancomycin allergy) and ceftriaxone for now - Await urine culture to evaluate for UTI -  Discussed with surgery who evaluated him.  Patient and his wife are agreeable to have I&D while here.  Surgery is planning to have it done later today or tomorrow  Hypokalemia/hypomagnesemia Repleted and resolved  Essential  Hypertension -  Resume home blood pressure medicine including amlodipine, Coreg and hydralazine     ESRD on HD MWF   Anemia due to chronic kidney disease - Last dialysis 4/22 - Hemoglobin at 8.8 - Continue Auryxia and vitamin B12  - Continue Renvela - Nephrology for dialysis need   Type 2 diabetes mellitus with kidney complication, with long-term current use of insulin (HCC) - Sliding scale insulin coverage      History of CVA (cerebrovascular accident)   Post stroke seizure disorder and cognitive deficit - CT head with no acute findings and patient with no focal deficit -  Continue statins  - Continue neurologic checks    Seizure disorder status post CVA ([place]) Breakthrough seizure in the setting of underlying infection versus dialysis disequilibrium syndrome - Patient had episodes of confusion in November 2021, again in February 2022 - Last EEG November 2021 was normal - Continue Keppra 500 twice daily with an extra dose postdialysis per last neurology note - neurology seen-Dr. Rory Percy recommends continuing current dose of Keppra and outpatient neurology follow-up - Neurologic  checks and seizure precautions    S/P bilateral BKA (below knee amputation) ([place]) - Increase nursing assistance for transfers.  Trapeze above bed if  desired    Chronic combined systolic and diastolic CHF (congestive heart failure) ([place]) - Not acutely exacerbated - Last echo November 2021 with EF 40 to 45% and grade 2 diastolic dysfunction - Monitor for worsening in view of IV hydration and the treatment of sepsis - Continue lisinopril, furosemide, spironolactone, carvedilol    History of GI bleed - Recent GI bleed in February 2022, requiring 2 units PRBC - Hemoglobin at baseline - EGD with duodenitis, colonoscopy with polyp status post polypectomy - Has been off NSAIDs - Anorexia     Body mass index is 20.79 kg/m.  Net IO Since Admission: 1,630.15 mL [02/08/21 1549]      Status is: Inpatient  Remains inpatient appropriate because:Ongoing diagnostic testing needed not appropriate for outpatient work up   Dispo: The patient is from: Home              Anticipated d/c is to: Home in 1 to 2 days  Patient currently is not medically stable to d/c.   Difficult to place patient No   DVT prophylaxis:       heparin injection 5,000 Units Start: 02/06/21 2300     Family Communication: Updated patient's wife over phone on 4/24   All the records are reviewed and case discussed with Care Management/Social Worker. Management plans discussed with the patient, family and they are in agreement.  CODE STATUS: Full Code Level of care: Med-Surg  TOTAL TIME TAKING CARE OF THIS PATIENT: 35 minutes.   More than 50% of the time was spent in counseling/coordination of care: YES  POSSIBLE D/C IN 1-2 DAYS, DEPENDING ON CLINICAL CONDITION.   Max Sane M.D on 02/08/2021 at 3:49 PM  [facility]   CC: Primary care physician; [name], MD  Note: This dictation was prepared with Dragon dictation along with smaller phrase technology. Any transcriptional  errors that result from this process are unintentional.

## 2021-02-08 NOTE — Progress Notes (Signed)
[name]  MRN: [id number]  DOB/AGE: 1961/10/17 60 y.o.  Primary Care Physician:[name], [name], MD  Admit date: 02/06/2021  Chief Complaint:  Chief Complaint  Patient presents with  . Altered Mental Status    S-Pt presented on  02/06/2021 with  Chief Complaint  Patient presents with  . Altered Mental Status   Patient is known to our group from previous admission in 2020 and 2022.  Patient is a 60 year old African-American male with a past medical history of diabetes mellitus, hypertension, end-stage renal disease on hemodialysis, Seizure disorder [name] is a 60 y.o. male with medical history significant for DM, HTN, ESRD on HD MWF, seizure disorder, CVA, hydradenitis suppurativa and Cutaneous Crohn's on [name] and as needed Augmentin for flareups, followed by [facility] dermatology, status post recent hospitalization for GI bleed, presenting with altered mental status/possible seizure, who was brought to the emergency room with altered mental status and confusion following dialysis yesterday.    Upon evaluation in the ER patient was found to be febrile with elevated WBC count of 18 K.  On examination patient was found to have aching and throbbing of a chronic wound of the left buttock related to his hidradenitis which has had a persistent drainage.  Patient was admitted with possible sepsis from cellulitis of his right buttock Nephrology was consulted for comanagement of dialysis patient Patient was seen today on second floor. Patient offers no new specific physical complaints     Medications . amLODipine  10 mg Oral Daily  . atorvastatin  80 mg Oral Daily  . carvedilol  25 mg Oral BID  . gabapentin  300 mg Oral QHS  . heparin  5,000 Units Subcutaneous Q8H  . hydrALAZINE  50 mg Oral Q8H  .  HYDROmorphone (DILAUDID) injection  0.5 mg Intravenous Once  . insulin aspart  0-5 Units Subcutaneous QHS  . insulin aspart  0-9 Units Subcutaneous TID WC  . levETIRAcetam  500  mg Oral BID         FGH:WEXHB from the symptoms mentioned above,there are no other symptoms referable to all systems reviewed.  Physical Exam: Vital signs in last 24 hours: Temp:  [98.3 F (36.8 C)-99.2 F (37.3 C)] 99.2 F (37.3 C) (04/24 0939) Pulse Rate:  [59-66] 64 (04/24 0939) Resp:  [16-20] 20 (04/24 0939) BP: (159-175)/(63-80) 175/80 (04/24 0939) SpO2:  [94 %-97 %] 97 % (04/24 0939) Weight change:  Last BM Date: 02/02/21  Intake/Output from previous day: 04/23 0701 - 04/24 0700 In: 1250.8 [P.O.:240; I.V.:360.8; IV Piggyback:650] Out: -  Total I/O In: 240 [P.O.:240] Out: -    Physical Exam:  General- pt is awake,alert, following commands  Resp- No acute REsp distress,L NO Rhonchi  CVS- S1S2 regular in rate and rhythm  GIT- BS+, soft, Non tender , Non distended  EXT- No LE Edema,  No Cyanosis  Access- LUE AVG   Lab Results:  CBC  Recent Labs    02/07/21 0414 02/08/21 0450  WBC 14.0* 8.7  HGB 8.8* 8.9*  HCT 28.4* 28.2*  PLT 119* 130*    BMET  Recent Labs    02/07/21 0414 02/07/21 1045 02/08/21 0450  NA 137  --  134*  K 2.7* 3.9 4.4  CL 93*  --  95*  CO2 28  --  25  GLUCOSE 262*  --  152*  BUN 25*  --  33*  CREATININE 4.77*  --  6.26*  CALCIUM 8.7*  --  8.6*  Most recent Creatinine trend  Lab Results  Component Value Date   CREATININE 6.26 (H) 02/08/2021   CREATININE 4.77 (H) 02/07/2021   CREATININE 3.76 (H) 02/06/2021      MICRO   Recent Results (from the past 240 hour(s))  Blood culture (single)     Status: None (Preliminary result)   Collection Time: 02/06/21  5:49 PM   Specimen: BLOOD  Result Value Ref Range Status   Specimen Description BLOOD RIGHT FA  Final   Special Requests   Final    BOTTLES DRAWN AEROBIC AND ANAEROBIC Blood Culture adequate volume   Culture   Final    NO GROWTH 2 DAYS Performed at [facility], [address], [place], [place] [address]    Report Status PENDING   Incomplete  Resp Panel by RT-PCR (Flu A&B, Covid) Nasopharyngeal Swab     Status: None   Collection Time: 02/06/21  6:45 PM   Specimen: Nasopharyngeal Swab; Nasopharyngeal(NP) swabs in vial transport medium  Result Value Ref Range Status   SARS Coronavirus 2 by RT PCR NEGATIVE NEGATIVE Final    Comment: (NOTE) SARS-CoV-2 target nucleic acids are NOT DETECTED.  The SARS-CoV-2 RNA is generally detectable in upper respiratory specimens during the acute phase of infection. The lowest concentration of SARS-CoV-2 viral copies this assay can detect is 138 copies/mL. A negative result does not preclude SARS-Cov-2 infection and should not be used as the sole basis for treatment or other patient management decisions. A negative result may occur with  improper specimen collection/handling, submission of specimen other than nasopharyngeal swab, presence of viral mutation(s) within the areas targeted by this assay, and inadequate number of viral copies(<138 copies/mL). A negative result must be combined with clinical observations, patient history, and epidemiological information. The expected result is Negative.  Fact Sheet for Patients:  EntrepreneurPulse.com.au  Fact Sheet for Healthcare Providers:  IncredibleEmployment.be  This test is no t yet approved or cleared by the [place] FDA and  has been authorized for detection and/or diagnosis of SARS-CoV-2 by FDA under an Emergency Use Authorization (EUA). This EUA will remain  in effect (meaning this test can be used) for the duration of the COVID-19 declaration under Section 564(b)(1) of the Act, 21 U.S.C.section 360bbb-3(b)(1), unless the authorization is terminated  or revoked sooner.       Influenza A by PCR NEGATIVE NEGATIVE Final   Influenza B by PCR NEGATIVE NEGATIVE Final    Comment: (NOTE) The Xpert Xpress SARS-CoV-2/FLU/RSV plus assay is intended as an aid in the diagnosis of influenza  from Nasopharyngeal swab specimens and should not be used as a sole basis for treatment. Nasal washings and aspirates are unacceptable for Xpert Xpress SARS-CoV-2/FLU/RSV testing.  Fact Sheet for Patients: EntrepreneurPulse.com.au  Fact Sheet for Healthcare Providers: IncredibleEmployment.be  This test is not yet approved or cleared by the [place] FDA and has been authorized for detection and/or diagnosis of SARS-CoV-2 by FDA under an Emergency Use Authorization (EUA). This EUA will remain in effect (meaning this test can be used) for the duration of the COVID-19 declaration under Section 564(b)(1) of the Act, 21 U.S.C. section 360bbb-3(b)(1), unless the authorization is terminated or revoked.  Performed at [facility], [address], [place], [place] [address]   Urine culture     Status: Abnormal   Collection Time: 02/06/21  8:41 PM   Specimen: Urine, Random  Result Value Ref Range Status   Specimen Description   Final    URINE,  RANDOM Performed at [facility], [address], [place], [place] [address]    Special Requests   Final    NONE Performed at [facility], [address], [place], [place] [address]    Culture (A)  Final    <10,000 COLONIES/mL INSIGNIFICANT GROWTH Performed at [facility] [address], [place],  [address]    Report Status 02/08/2021 FINAL  Final         Impression:   60 year old male with history of DM, HTN, ESRD on HD, seizure disorder, CVA, hydradenitis suppurativa and cutaneous Crohn's on [name] and as needed Augmentin for flareups, followed by [facility] dermatology, status post recent hospitalization for GI bleed, presenting with altered mental status/possible seizure, who was brought to the emergency room with altered mental status and confusion following dialysis earlier in the day.  In ER, febrile with leukocytosis of 18,000.   Patient undergoes  dialysis as an outpatient at [facility] dialysis on [address] Patient outpatient treatment is Dialyzer ELI 17 H 2K/2.5 calcium bath Treatment time 3 hours 30 minutes Left AV G  Patient is on  Epogen per treatment Patient is on 2 mcg of Hectorol  1)Renal    End-stage renal disease Patient is on hemodialysis Patient is on Monday Wednesday Friday schedule Patient was last dialyzed on Friday No need for renal replacement therapy today  2)HTN    Blood pressure is stable    3)Anemia of chronic disease  CBC Latest Ref Rng & Units 02/08/2021 02/07/2021 02/06/2021  WBC 4.0 - 10.5 K/uL 8.7 14.0(H) 18.4(H)  Hemoglobin 13.0 - 17.0 g/dL 8.9(L) 8.8(L) 9.2(L)  Hematocrit 39.0 - 52.0 % 28.2(L) 28.4(L) 29.7(L)  Platelets 150 - 400 K/uL 130(L) 119(L) 131(L)       HGb is not at goal (9--11) Patient is on Epogen protocol  4) Secondary hyperparathyroidism -CKD Mineral-Bone Disorder    Lab Results  Component Value Date   PTH 283 (H) 01/08/2019   CALCIUM 8.6 (L) 02/08/2021   CAION 1.10 (L) 09/12/2020   PHOS 3.7 11/21/2020    Secondary Hyperparathyroidism present Phosphorus at goal.   5)Sepsis secondary to right buttock cellulitis Patient is on antibiotics Primary team is following  6) Electrolytes   BMP Latest Ref Rng & Units 02/08/2021 02/07/2021 02/07/2021  Glucose 70 - 99 mg/dL 152(H) - 262(H)  BUN 6 - 20 mg/dL 33(H) - 25(H)  Creatinine 0.61 - 1.24 mg/dL 6.26(H) - 4.77(H)  BUN/Creat Ratio 9 - 20 - - -  Sodium 135 - 145 mmol/L 134(L) - 137  Potassium 3.5 - 5.1 mmol/L 4.4 3.9 2.7(LL)  Chloride 98 - 111 mmol/L 95(L) - 93(L)  CO2 22 - 32 mmol/L 25 - 28  Calcium 8.9 - 10.3 mg/dL 8.6(L) - 8.7(L)     Sodium Hyponatremia Secondary to ESRD   Potassium Hypokalemic Now better   7)Acid base    Co2 at goal     Plan:  Will dialyze in a.m. No need for renal replacement therapy today We will keep patient on Epogen We will continue to follow     Shirlie Enck s  [facility] 02/08/2021, 10:48 AM

## 2021-02-08 NOTE — TOC Initial Note (Addendum)
Transition of Care [facility]) - Initial/Assessment Note    Patient Details  Name: [name] MRN: [id number] Date of Birth: 21-Aug-1961  Transition of Care [facility]) CM/SW Contact:    Magnus Ivan, LCSW Phone Number: 02/08/2021, 9:50 AM  Clinical Narrative:                CSW spoke with patient's wife, [name], for High Risk Screening due to patient's memory impairment. Patient lives with his wife and son. [name] works from home and provides transportation to appointments. Patient uses [employer] transportation for M, W, F Dialysis at [facility] on [address]. PCP is Dr. Hinton Lovely. Pharmacy is [facility] in [place]. Patient has a RW, rollator, tub bench, and wheelchair. [name] reported patient's wheelchair is not in good shape and he has fallen out of it a few times. She would like to check and see if he would qualify for a new one through insurance. She thinks he has had it for about 4 years, and understands usually insurance will only cover one every 5 years. CSW spoke with [place] with [employer] who stated their office would be able to check Monday to see if patient qualifies, he will follow up with weekday [facility] regarding this. Patient has had [facility] recently and went to [facility] in [place] about 3 years ago. [name] is agreeable to [facility] or SNF, depending on PT/OT evals. She would like to use [facility] for [facility]. She would not want [facility] for SNF. She said patient has had both COVID vaccines plus the booster. TOC will follow up after PT/OT evals.  1:30- Called wife to discuss [facility] recommendation. She is agreeable to PT, OT, Aide, and RN services. She is not sure if she will need Aide services, but thinks she may so wants to have them and will cancel those if needed. Referral made to [facility] Representative [place]. Waiting for confirmation.  Expected Discharge Plan: [place] Barriers to Discharge: Continued Medical Work up   Patient Goals and CMS Choice Patient states their goals for this  hospitalization and ongoing recovery are:: to return home CMS Medicare.gov Compare Post Acute Care list provided to:: Patient Represenative (must comment) Choice offered to / list presented to : Spouse  Expected Discharge Plan and Services Expected Discharge Plan: [place]       Living arrangements for the past 2 months: Single Family Home                                      Prior Living Arrangements/Services Living arrangements for the past 2 months: Single Family Home Lives with:: Spouse,Adult Children Patient language and need for interpreter reviewed:: Yes Do you feel safe going back to the place where you live?: Yes      Need for Family Participation in Patient Care: Yes (Comment) Care giver support system in place?: Yes (comment) Current home services: DME Criminal Activity/Legal Involvement Pertinent to Current Situation/Hospitalization: No - Comment as needed  Activities of Daily Living Home Assistive Devices/Equipment: CBG Meter,Eyeglasses,Prosthesis,Walker (specify type) ADL Screening (condition at time of admission) Patient's cognitive ability adequate to safely complete daily activities?: Yes Is the patient deaf or have difficulty hearing?: No Does the patient have difficulty seeing, even when wearing glasses/contacts?: No Does the patient have difficulty concentrating, remembering, or making decisions?: No Patient able to express need for assistance with ADLs?: Yes Does the  patient have difficulty dressing or bathing?: No Independently performs ADLs?: Yes (appropriate for developmental age) Does the patient have difficulty walking or climbing stairs?: No Weakness of Legs: None Weakness of Arms/Hands: None  Permission Sought/Granted Permission sought to share information with : Chartered certified accountant granted to share information with : Yes, Verbal Permission Granted     Permission granted to share info w AGENCY: HH,  SNF, DME as needed        Emotional Assessment       Orientation: : Fluctuating Orientation (Suspected and/or reported Sundowners) Alcohol / Substance Use: Not Applicable Psych Involvement: No (comment)  Admission diagnosis:  Transient alteration of awareness [R40.4] Sepsis ([place]) [A41.9] Sepsis without acute organ dysfunction, due to unspecified organism [facility]) [A41.9] Patient Active Problem List   Diagnosis Date Noted  . History of GI bleed 02/06/2021  . Long term current use of immunosuppressive drug 02/06/2021  . Disorder of skin due to Crohn's disease ([place]) 02/06/2021  . Complication of vascular access for dialysis 01/22/2021  . Polyp of transverse colon   . Acute metabolic encephalopathy 35/57/3220  . Anemia in ESRD (end-stage renal disease) ([place]) 11/18/2020  . Chronic systolic CHF (congestive heart failure) ([place]) 11/18/2020  . Cerebrovascular disease 09/12/2020  . Hyperkalemia 09/12/2020  . Mild cognitive impairment 09/30/2019  . Peripheral vascular disease ([place]) 06/28/2018  . Sepsis ([place]) 01/12/2018  . Pressure injury of skin 12/04/2017  . S/P bilateral BKA (below knee amputation) ([place]) 10/20/2017  . Atherosclerosis of native arteries of extremity with rest pain ([place]) 08/05/2017  . History of CVA (cerebrovascular accident) 04/15/2017  . Seizure disorder ([place]) 04/15/2017  . End stage renal failure on dialysis ([place]) 04/12/2016  . Aphthae 02/20/2016  . Hidradenitis suppurativa 02/20/2016  . Leg pain 02/20/2016  . Neuropathy 02/20/2016  . Narrowing of intervertebral disc space 08/29/2015  . Vascular disorder of lower extremity 08/29/2015  . Failure of erection 08/29/2015  . Hypercholesteremia 08/29/2015  . Hypertension 08/29/2015  . Anemia due to chronic kidney disease 05/26/2015  . Venous insufficiency of leg 09/04/2014  . History of deep vein thrombosis (DVT) of lower extremity 08/16/2014  . Prostatic intraepithelial neoplasia 11/02/2013  . Elevated prostate  specific antigen (PSA) 09/11/2013  . Benign prostatic hyperplasia with urinary obstruction 08/13/2013  . Spermatocele 08/13/2013  . Avitaminosis D 01/25/2013  . Type 2 diabetes mellitus with kidney complication, with long-term current use of insulin ([place]) 03/28/2012  . Crohn disease ([place]) 08/03/2011   PCP:  [name], MD Pharmacy:   [facility] [phone] - [address], [address] [place] [address] Phone: [phone] Fax: [phone]     Social Determinants of Health (SDOH) Interventions    Readmission Risk Interventions Readmission Risk Prevention Plan 02/08/2021 11/21/2020 11/20/2020  Transportation Screening Complete Complete -  PCP or Specialist Appt within 5-7 Days Complete - -  Home Care Screening Complete - -  Medication Review (RN CM) Complete - -  HRI or Home Care Consult - - Complete  Palliative Care Screening - - Not Applicable  Medication Review (RN Care Manager) - - Complete  Some recent data might be hidden

## 2021-02-09 ENCOUNTER — Other Ambulatory Visit (HOSPITAL_COMMUNITY): Payer: Self-pay

## 2021-02-09 DIAGNOSIS — L988 Other specified disorders of the skin and subcutaneous tissue: Secondary | ICD-10-CM

## 2021-02-09 DIAGNOSIS — K5090 Crohn's disease, unspecified, without complications: Secondary | ICD-10-CM

## 2021-02-09 LAB — GLUCOSE, CAPILLARY
Glucose-Capillary: 172 mg/dL — ABNORMAL HIGH (ref 70–99)
Glucose-Capillary: 149 mg/dL — ABNORMAL HIGH (ref 70–99)
Glucose-Capillary: 102 mg/dL — ABNORMAL HIGH (ref 70–99)

## 2021-02-09 LAB — BASIC METABOLIC PANEL
Anion gap: 15 (ref 5–15)
BUN: 45 mg/dL — ABNORMAL HIGH (ref 6–20)
CO2: 22 mmol/L (ref 22–32)
Calcium: 8.2 mg/dL — ABNORMAL LOW (ref 8.9–10.3)
Chloride: 92 mmol/L — ABNORMAL LOW (ref 98–111)
Creatinine, Ser: 7.14 mg/dL — ABNORMAL HIGH (ref 0.61–1.24)
GFR, Estimated: 8 mL/min — ABNORMAL LOW (ref >60)
Glucose, Bld: 170 mg/dL — ABNORMAL HIGH (ref 70–99)
Potassium: 4.4 mmol/L (ref 3.5–5.1)
Sodium: 129 mmol/L — ABNORMAL LOW (ref 135–145)

## 2021-02-09 LAB — CBC
HCT: 29.5 % — ABNORMAL LOW (ref 39.0–52.0)
Hemoglobin: 9 g/dL — ABNORMAL LOW (ref 13.0–17.0)
MCH: 26 pg (ref 26.0–34.0)
MCHC: 30.5 g/dL (ref 30.0–36.0)
MCV: 85.3 fL (ref 80.0–100.0)
Platelets: 135 10*3/uL — ABNORMAL LOW (ref 150–400)
RBC: 3.46 MIL/uL — ABNORMAL LOW (ref 4.22–5.81)
RDW: 20.1 % — ABNORMAL HIGH (ref 11.5–15.5)
WBC: 10.5 10*3/uL (ref 4.0–10.5)
nRBC: 0 % (ref 0.0–0.2)

## 2021-02-09 LAB — HEPATITIS B SURFACE ANTIGEN: Hepatitis B Surface Ag: NONREACTIVE

## 2021-02-09 LAB — HEPATITIS B SURFACE ANTIBODY,QUALITATIVE: Hep B S Ab: REACTIVE — AB

## 2021-02-09 LAB — HEPATITIS B CORE ANTIBODY, TOTAL: Hep B Core Total Ab: NONREACTIVE

## 2021-02-09 LAB — PATHOLOGIST SMEAR REVIEW

## 2021-02-09 MED ORDER — SODIUM CHLORIDE 0.9 % IV SOLN: 100.0000 mL | INTRAVENOUS | Status: DC | PRN

## 2021-02-09 MED ORDER — LIDOCAINE HCL (PF) 1 % IJ SOLN
5.0000 mL | INTRAMUSCULAR | Status: DC | PRN
  Filled 2021-02-09: qty 5

## 2021-02-09 MED ORDER — LIDOCAINE-PRILOCAINE 2.5-2.5 % EX CREA
1.0000 "application " | TOPICAL_CREAM | CUTANEOUS | Status: DC | PRN
  Filled 2021-02-09: qty 5

## 2021-02-09 MED ORDER — CLONIDINE HCL 0.1 MG PO TABS
0.1000 mg | ORAL_TABLET | Freq: Once | ORAL | Status: AC
  Administered 2021-02-09: 0.1 mg via ORAL

## 2021-02-09 MED ORDER — LEVETIRACETAM 500 MG PO TABS
500.0000 mg | ORAL_TABLET | ORAL | Status: DC
  Administered 2021-02-09: 500 mg via ORAL
  Filled 2021-02-09: qty 1

## 2021-02-09 MED ORDER — HYDRALAZINE HCL 50 MG PO TABS
50.0000 mg | ORAL_TABLET | Freq: Four times a day (QID) | ORAL | Status: DC
  Administered 2021-02-09 – 2021-02-10 (×4): 50 mg via ORAL
  Filled 2021-02-09 (×3): qty 1

## 2021-02-09 MED ORDER — PENTAFLUOROPROP-TETRAFLUOROETH EX AERO
1.0000 "application " | INHALATION_SPRAY | CUTANEOUS | Status: DC | PRN
  Filled 2021-02-09: qty 30

## 2021-02-09 MED ORDER — CLONIDINE HCL 0.1 MG PO TABS
0.1000 mg | ORAL_TABLET | Freq: Two times a day (BID) | ORAL | Status: DC
  Administered 2021-02-09 – 2021-02-10 (×2): 0.1 mg via ORAL
  Filled 2021-02-09 (×2): qty 1

## 2021-02-09 MED ORDER — HEPARIN SODIUM (PORCINE) 1000 UNIT/ML DIALYSIS
100.0000 [IU]/kg | INTRAMUSCULAR | Status: DC | PRN
  Filled 2021-02-09: qty 7

## 2021-02-09 MED ORDER — HYDRALAZINE HCL 20 MG/ML IJ SOLN
10.0000 mg | Freq: Once | INTRAMUSCULAR | Status: AC
  Administered 2021-02-09: 20 mg via INTRAVENOUS
  Filled 2021-02-09: qty 0.5

## 2021-02-09 MED ORDER — LEVETIRACETAM 500 MG PO TABS
500.0000 mg | ORAL_TABLET | Freq: Every day | ORAL | Status: DC
  Administered 2021-02-10: 500 mg via ORAL
  Filled 2021-02-09 (×2): qty 1

## 2021-02-09 MED ORDER — DIPHENHYDRAMINE HCL 50 MG/ML IJ SOLN: 25.0000 mg | Freq: Once | INTRAMUSCULAR | Status: DC

## 2021-02-09 MED ORDER — HEPARIN SODIUM (PORCINE) 1000 UNIT/ML DIALYSIS: 1000.0000 [IU] | INTRAMUSCULAR | Status: DC | PRN

## 2021-02-09 MED ORDER — ALTEPLASE 2 MG IJ SOLR: 2.0000 mg | Freq: Once | INTRAMUSCULAR | Status: DC | PRN

## 2021-02-09 NOTE — TOC Progression Note (Signed)
Transition of Care [facility]) - Progression Note    Patient Details  Name: [name] MRN: [id number] Date of Birth: Jun 08, 1961  Transition [facility] Care [facility]) CM/SW Contact  Beverly Sessions, RN Phone Number: 02/09/2021, 1:47 PM  Clinical Narrative:     Followed up with [name] from [place] to see if patient qualifies for a new WC.  He is to get back with me  Expected Discharge Plan: [place] Barriers to Discharge: Continued Medical Work up  Expected Discharge Plan and Services Expected Discharge Plan: [place] arrangements for the past 2 months: Single Family Home                                       Social Determinants of Health (SDOH) Interventions    Readmission Risk Interventions Readmission Risk Prevention Plan 02/08/2021 11/21/2020 11/20/2020  Transportation Screening Complete Complete -  PCP or Specialist Appt within 5-7 Days Complete - -  Home Care Screening Complete - -  Medication Review (RN CM) Complete - -  HRI or [place] - - Complete  Palliative Care Screening - - Not Applicable  Medication Review (RN Care Manager) - - Complete  Some recent data might be hidden

## 2021-02-09 NOTE — Care Management Important Message (Signed)
Important Message  Patient Details  Name: [name] MRN: [id number] Date of Birth: 21-Jul-1961   Medicare Important Message Given:  Yes     [name] 02/09/2021, 11:28 AM

## 2021-02-09 NOTE — Progress Notes (Signed)
[place], [place] 02/09/21  Subjective:   Hospital day # 3    Doing fair Seen during HD   Reports shortness of breath BP is elevated Requiring higher level of O2 -with NRB mask Renal: 04/24 0701 - 04/25 0700 In: 2565.5 [P.O.:240; I.V.:1525.5; IV Piggyback:800] Out: 0  Lab Results  Component Value Date   CREATININE 7.14 (H) 02/09/2021   CREATININE 6.26 (H) 02/08/2021   CREATININE 4.77 (H) 02/07/2021     Objective:  Vital signs in last 24 hours:  Temp:  [97.8 F (36.6 C)-98.9 F (37.2 C)] 98.5 F (36.9 C) (04/25 3825) Pulse Rate:  [60-66] 65 (04/25 0938) Resp:  [18-20] 20 (04/25 0938) BP: (153-185)/(56-75) 185/71 (04/25 0938) SpO2:  [90 %-100 %] 95 % (04/25 0938)  Weight change:  Filed Weights   02/06/21 1733  Weight: 67.6 kg    Intake/Output:    Intake/Output Summary (Last 24 hours) at 02/09/2021 1004 Last data filed at 02/09/2021 0300 Gross per 24 hour  Intake 2325.48 ml  Output 0 ml  Net 2325.48 ml     Physical Exam: General:  No acute distress, laying in the bed  HEENT  anicteric, moist oral mucous membrane  Pulm/lungs  dyspneic, decreased breath sounds at bases  CVS/Heart  regular rhythm, no rub or gallop  Abdomen:   Soft, nontender  Extremities:  No peripheral edema  Neurologic:  Alert, oriented, able to follow commands  Skin:  No acute rashes       Basic Metabolic Panel:  Recent Labs  Lab 02/06/21 1756 02/07/21 0414 02/07/21 1045 02/08/21 0450 02/09/21 0448  NA 139 137  --  134* 129*  K 3.0* 2.7* 3.9 4.4 4.4  CL 97* 93*  --  95* 92*  CO2 29 28  --  25 22  GLUCOSE 260* 262*  --  152* 170*  BUN 19 25*  --  33* 45*  CREATININE 3.76* 4.77*  --  6.26* 7.14*  CALCIUM 8.7* 8.7*  --  8.6* 8.2*  MG  --  1.6*  --   --   --      CBC: Recent Labs  Lab 02/06/21 1756 02/07/21 0414 02/08/21 0450 02/09/21 0448  WBC 18.4* 14.0* 8.7 10.5  NEUTROABS 16.9*  --   --   --   HGB 9.2* 8.8* 8.9* 9.0*  HCT 29.7*  28.4* 28.2* 29.5*  MCV 84.4 85.5 84.4 85.3  PLT 131* 119* 130* 135*      Lab Results  Component Value Date   HEPBSAG NON REACTIVE 09/13/2020      Microbiology:  Recent Results (from the past 240 hour(s))  Blood culture (single)     Status: None (Preliminary result)   Collection Time: 02/06/21  5:49 PM   Specimen: BLOOD  Result Value Ref Range Status   Specimen Description BLOOD RIGHT FA  Final   Special Requests   Final    BOTTLES DRAWN AEROBIC AND ANAEROBIC Blood Culture adequate volume   Culture   Final    NO GROWTH 3 DAYS Performed at [facility], [address] [place], [place] [address]    Report Status PENDING  Incomplete  Resp Panel by RT-PCR (Flu A&B, Covid) Nasopharyngeal Swab     Status: None   Collection Time: 02/06/21  6:45 PM   Specimen: Nasopharyngeal Swab; Nasopharyngeal(NP) swabs in vial transport medium  Result Value Ref Range Status   SARS Coronavirus 2 by RT PCR NEGATIVE NEGATIVE Final    Comment: (  NOTE) SARS-CoV-2 target nucleic acids are NOT DETECTED.  The SARS-CoV-2 RNA is generally detectable in upper respiratory specimens during the acute phase of infection. The lowest concentration of SARS-CoV-2 viral copies this assay can detect is 138 copies/mL. A negative result does not preclude SARS-Cov-2 infection and should not be used as the sole basis for treatment or other patient management decisions. A negative result may occur with  improper specimen collection/handling, submission of specimen other than nasopharyngeal swab, presence of viral mutation(s) within the areas targeted by this assay, and inadequate number of viral copies(<138 copies/mL). A negative result must be combined with clinical observations, patient history, and epidemiological information. The expected result is Negative.  Fact Sheet for Patients:  EntrepreneurPulse.com.au  Fact Sheet for Healthcare Providers:   IncredibleEmployment.be  This test is no t yet approved or cleared by the [place] FDA and  has been authorized for detection and/or diagnosis of SARS-CoV-2 by FDA under an Emergency Use Authorization (EUA). This EUA will remain  in effect (meaning this test can be used) for the duration of the COVID-19 declaration under Section 564(b)(1) of the Act, 21 U.S.C.section 360bbb-3(b)(1), unless the authorization is terminated  or revoked sooner.       Influenza A by PCR NEGATIVE NEGATIVE Final   Influenza B by PCR NEGATIVE NEGATIVE Final    Comment: (NOTE) The Xpert Xpress SARS-CoV-2/FLU/RSV plus assay is intended as an aid in the diagnosis of influenza from Nasopharyngeal swab specimens and should not be used as a sole basis for treatment. Nasal washings and aspirates are unacceptable for Xpert Xpress SARS-CoV-2/FLU/RSV testing.  Fact Sheet for Patients: EntrepreneurPulse.com.au  Fact Sheet for Healthcare Providers: IncredibleEmployment.be  This test is not yet approved or cleared by the [place] FDA and has been authorized for detection and/or diagnosis of SARS-CoV-2 by FDA under an Emergency Use Authorization (EUA). This EUA will remain in effect (meaning this test can be used) for the duration of the COVID-19 declaration under Section 564(b)(1) of the Act, 21 U.S.C. section 360bbb-3(b)(1), unless the authorization is terminated or revoked.  Performed at [facility], [address], [place], [place] [address]   Urine culture     Status: Abnormal   Collection Time: 02/06/21  8:41 PM   Specimen: Urine, Random  Result Value Ref Range Status   Specimen Description   Final    URINE, RANDOM Performed at [facility], [address] [place], [place] [address]    Special Requests   Final    NONE Performed at [facility], [address] [place], [place] [address]    Culture (A)   Final    <10,000 COLONIES/mL INSIGNIFICANT GROWTH Performed at [facility], [place] [address], [place], [place] [address]    Report Status 02/08/2021 FINAL  Final  Aerobic/Anaerobic Culture w Gram Stain (surgical/deep wound)     Status: None (Preliminary result)   Collection Time: 02/08/21  2:57 PM   Specimen: Buttocks; Abscess  Result Value Ref Range Status   Specimen Description   Final    BUTTOCKS Performed at [facility], [address] [place], [place] [address]    Special Requests   Final    Immunocompromised Performed at [facility], [place], [place] [address]    Gram Stain   Final    FEW WBC PRESENT, PREDOMINANTLY PMN RARE GRAM POSITIVE COCCI IN PAIRS IN CLUSTERS Performed at [facility], [place] [address], [place], [place]  [address]    Culture PENDING  Incomplete   Report Status PENDING  Incomplete    Coagulation Studies: Recent Labs    02/06/21 1756 02/07/21 0414  LABPROT 14.3 15.3*  INR 1.1 1.2    Urinalysis: Recent Labs    02/06/21 2041  COLORURINE YELLOW*  LABSPEC 1.024  PHURINE 7.0  GLUCOSEU 150*  HGBUR MODERATE*  BILIRUBINUR NEGATIVE  KETONESUR NEGATIVE  PROTEINUR >=300*  NITRITE NEGATIVE  LEUKOCYTESUR NEGATIVE      Imaging: No results found.   Medications:   . sodium chloride 50 mL/hr at 02/08/21 0928  . sodium chloride    . sodium chloride    . cefTRIAXone (ROCEPHIN)  IV 2 g (02/08/21 2312)  . linezolid (ZYVOX) IV 600 mg (02/09/21 0328)   . amLODipine  10 mg Oral Daily  . atorvastatin  80 mg Oral Daily  . carvedilol  25 mg Oral BID  . Chlorhexidine Gluconate Cloth  6 each Topical Q0600  . epoetin (EPOGEN/PROCRIT) injection  10,000 Units Intravenous Q M,W,F-HD  . gabapentin  300 mg Oral QHS  . heparin  5,000 Units Subcutaneous Q8H  . hydrALAZINE  50 mg Oral Q8H  .  HYDROmorphone (DILAUDID) injection  0.5 mg Intravenous Once  . insulin aspart  0-5 Units Subcutaneous QHS  . insulin  aspart  0-9 Units Subcutaneous TID WC  . levETIRAcetam  500 mg Oral BID  . levETIRAcetam  500 mg Oral Q M,W,F-1800   sodium chloride, sodium chloride, acetaminophen **OR** acetaminophen, alteplase, heparin, heparin, HYDROcodone-acetaminophen, ketorolac, lidocaine (PF), lidocaine-prilocaine, ondansetron **OR** ondansetron (ZOFRAN) IV, pentafluoroprop-tetrafluoroeth  Assessment/ Plan:  60 y.o. 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  admitted on 02/06/2021 for Transient alteration of awareness [R40.4] Sepsis ([place]) [A41.9] Sepsis without acute organ dysfunction, due to unspecified organism ([place]) [A41.9]   # ESRD HD MWF HD today   # Acute metabolic encephalopathy - ? Seizure - treated with Keppra and or sepsis  # Anemia of CKD Lab Results  Component Value Date   HGB 9.0 (L) 02/09/2021    - Continue scheduled EPO  # Secondary hyperparathyroidism of renal origin N 25.81  Lab Results  Component Value Date   PTH 283 (H) 01/08/2019   CALCIUM 8.2 (L) 02/09/2021   CAION 1.10 (L) 09/12/2020   PHOS 3.7 11/21/2020   monitor phos and Ca this admission  # Sepsis secondary to rt buttock cellulitis continued on Rocephin and Zyvox - dose with pharmacy assistance  # HTN urgency Given iv hydralazine and clonidine during HD Add scheduled clonidine    LOS: [place] 4/25/202210:04 AM  [place], [place]  Note: This note was prepared with Dragon dictation. Any transcription errors are unintentional

## 2021-02-09 NOTE — Progress Notes (Addendum)
[place] [facility] [place] NAME: [name]    MR#:  [id number]  DATE OF BIRTH:  1961-07-28  SUBJECTIVE:  CHIEF COMPLAINT:   Chief Complaint  Patient presents with  . Altered Mental Status  Denies any new complaints.  Requesting new wheelchair.  Status post I&D yesterday REVIEW OF SYSTEMS:  Review of Systems  Constitutional: Positive for malaise/fatigue. Negative for diaphoresis, fever and weight loss.  HENT: Negative for ear discharge, ear pain, hearing loss, nosebleeds, sore throat and tinnitus.   Eyes: Negative for blurred vision and pain.  Respiratory: Negative for cough, hemoptysis, shortness of breath and wheezing.   Cardiovascular: Negative for chest pain, palpitations, orthopnea and leg swelling.  Gastrointestinal: Negative for abdominal pain, blood in stool, constipation, diarrhea, heartburn, nausea and vomiting.  Genitourinary: Negative for dysuria, frequency and urgency.  Musculoskeletal: Negative for back pain and myalgias.  Skin: Positive for rash. Negative for itching.  Neurological: Negative for dizziness, tingling, tremors, focal weakness, seizures, weakness and headaches.  Psychiatric/Behavioral: Negative for depression. The patient is not nervous/anxious.    DRUG ALLERGIES:   Allergies  Allergen Reactions  . Methotrexate Other (See Comments)    Blood count drops  . Vancomycin Shortness Of Breath    Eyes watering, SOB, wheezing  . Cefepime Other (See Comments)  . Tape    VITALS:  Blood pressure (!) 185/71, pulse 65, temperature 98.5 F (36.9 C), temperature source Oral, resp. rate 20, height 5' 11"  (1.803 m), weight 67.6 kg, SpO2 95 %. PHYSICAL EXAMINATION:  Physical Exam  60 year old male lying in the bed comfortably without any acute distress Cardiovascular S1-S2 normal, no murmur rales or gallop Lungs clear to auscultation bilaterally no wheezing rales rhonchi crepitation Abdomen soft nontender nondistended bowel sounds  present Neurologically nonfocal exam.  He is slow in his responses likely mild cognitive issues Skin.  See picture   LABORATORY PANEL:  Male CBC Recent Labs  Lab 02/09/21 0448  WBC 10.5  HGB 9.0*  HCT 29.5*  PLT 135*   ------------------------------------------------------------------------------------------------------------------ Chemistries  Recent Labs  Lab 02/06/21 1756 02/07/21 0414 02/07/21 1045 02/09/21 0448  NA 139 137   < > 129*  K 3.0* 2.7*   < > 4.4  CL 97* 93*   < > 92*  CO2 29 28   < > 22  GLUCOSE 260* 262*   < > 170*  BUN 19 25*   < > 45*  CREATININE 3.76* 4.77*   < > 7.14*  CALCIUM 8.7* 8.7*   < > 8.2*  MG  --  1.6*  --   --   AST 13*  --   --   --   ALT 8  --   --   --   ALKPHOS 64  --   --   --   BILITOT 0.9  --   --   --    < > = values in this interval not displayed.   RADIOLOGY:  No results found. ASSESSMENT AND PLAN:  60 y.o. male with medical history significant for DM, HTN, ESRD on HD MWF, seizure disorder, CVA, hydradenitis suppurativa and cutaneous Crohn's on [name] and as needed Augmentin for flareups, followed by [facility] dermatology, status post recent hospitalization for GI bleed admitted for altered mental status and confusion following dialysis earlier in the day concerning for seizure to his spouse    Acute metabolic encephalopathy present on admission and now resolved   Vascular dementia Breakthrough seizure - likely related  to acute infection with underlying vascular dementia and multiple medical comorbidities including ESRD - CT head showing no acute intracranial findings -  No seizure activity was witnessed -Seen by neurology and recommends to continue [place] as ordered for now. - Outpatient neurology follow-up for evaluation and management of vascular dementia    Sepsis secondary to cellulitis right buttock   Hidradenitis suppurativa   Long term current use of immunosuppressive drug/[name]   Disorder of skin due to Crohn's  disease/cutaneous Crohn's - Patient has chronic wound with drainage of left buttock related to his hidradenitis suppurativa and is followed by [facility] dermatology and takes Augmentin as needed for flareups - At high risk for infection due to being on long-term [name] - Presents with altered mental status, and had T-max of 101.7 with leukocytosis of 18,000 on admission  - Source suspected cellulitis/abscess related to wound on right buttock - CT abdomen and pelvis showed no acute process - Urinalysis only minimally abnormal -  Continue IV Zyvox (vancomycin allergy) and ceftriaxone for now. Can switch him to PO Keflex - s/p I & d on 4/24  Hypokalemia/hypomagnesemia Repleted and resolved  Essential  Hypertension -  Continue amlodipine, Coreg and hydralazine.  Increase the dose of hydralazine from 50 mg every 8 to every 6 hours     ESRD on HD MWF   Anemia due to chronic kidney disease - Last dialysis 4/22 - Hemoglobin at 9 - Continue Auryxia and vitamin B12  - Continue Renvela - Nephrology for dialysis need.  He is due for dialysis today   Type 2 diabetes mellitus with kidney complication, with long-term current use of insulin (HCC) - Sliding scale insulin coverage      History of CVA (cerebrovascular accident)   Post stroke seizure disorder and cognitive deficit - CT head with no acute findings and patient with no focal deficit -  Continue statins  - Continue neurologic checks    Seizure disorder status post CVA ([place]) Breakthrough seizure in the setting of underlying infection versus dialysis disequilibrium syndrome - Patient had episodes of confusion in November 2021, again in February 2022 - Last EEG November 2021 was normal - Continue Keppra 500 twice daily with an extra dose postdialysis per last neurology note - neurology seen-Dr. Rory Percy recommends continuing current dose of Keppra and outpatient neurology follow-up - Neurologic checks and seizure precautions    S/P  bilateral BKA (below knee amputation) ([place]) - Increase nursing assistance for transfers.  Trapeze above bed if  desired.  Patient is requesting new wheelchair.  TOC is working on it    Chronic combined systolic and diastolic CHF (congestive heart failure) ([place]) - Not acutely exacerbated - Last echo November 2021 with EF 40 to 45% and grade 2 diastolic dysfunction - Continue lisinopril, furosemide, spironolactone, carvedilol    History of GI bleed - Recent GI bleed in February 2022, requiring 2 units PRBC - Hemoglobin at baseline - EGD with duodenitis, colonoscopy with polyp status post polypectomy - Has been off NSAIDs - Anorexia   Hyponatremia Sodium 129 should improve with dialysis  Body mass index is 20.79 kg/m.  Net IO Since Admission: 3,955.63 mL [02/09/21 1419]      Status is: Inpatient  Remains inpatient appropriate because:Ongoing diagnostic testing needed not appropriate for outpatient work up   Dispo: The patient is from: Home              Anticipated d/c is to: Home in 1 to 2 days  Patient currently is not medically stable to d/c.   Difficult to place patient No   DVT prophylaxis:       heparin injection 5,000 Units Start: 02/06/21 2300     Family Communication: Updated patient's wife over phone on 4/24   All the records are reviewed and case discussed with Care Management/Social Worker. Management plans discussed with the patient, family and they are in agreement.  CODE STATUS: Full Code Level of care: Med-Surg  TOTAL TIME TAKING CARE OF THIS PATIENT: 35 minutes.   More than 50% of the time was spent in counseling/coordination of care: YES  POSSIBLE D/C IN 1-2 DAYS, DEPENDING ON CLINICAL CONDITION.   Max Sane M.D on 02/09/2021 at 2:19 PM  [facility]   CC: Primary care physician; [name], MD  Note: This dictation was prepared with Dragon dictation along with smaller phrase technology. Any transcriptional errors  that result from this process are unintentional.

## 2021-02-10 DIAGNOSIS — L03317 Cellulitis of buttock: Secondary | ICD-10-CM

## 2021-02-10 DIAGNOSIS — R404 Transient alteration of awareness: Secondary | ICD-10-CM

## 2021-02-10 LAB — GLUCOSE, CAPILLARY
Glucose-Capillary: 125 mg/dL — ABNORMAL HIGH (ref 70–99)
Glucose-Capillary: 122 mg/dL — ABNORMAL HIGH (ref 70–99)

## 2021-02-10 LAB — HEPATITIS B SURFACE ANTIBODY, QUANTITATIVE: Hep B S AB Quant (Post): 20.5 m[IU]/mL (ref >9.9)

## 2021-02-10 MED ORDER — AMOXICILLIN-POT CLAVULANATE 875-125 MG PO TABS: 1.0000 | ORAL_TABLET | Freq: Two times a day (BID) | ORAL | 0 refills | Status: DC

## 2021-02-10 MED ORDER — CLONIDINE HCL 0.1 MG PO TABS: 0.1000 mg | ORAL_TABLET | Freq: Two times a day (BID) | ORAL | 0 refills | Status: DC

## 2021-02-10 NOTE — Plan of Care (Addendum)
Wife and son arrived to discharge patient.  Assisted patient with protheses and assisted to w/c. PIV removed with tip intact.  Patient discharged to home with home health and family. Reviewed discharge instructions, medication changes, wound care instructions, and appointment reminders with wife and patient who verbalized understanding.  Wound care supplies given to wife.

## 2021-02-10 NOTE — TOC Transition Note (Signed)
Transition of Care [facility]) - CM/SW Discharge Note   Patient Details  Name: [name] MRN: [id number] Date of Birth: 1961-05-26  Transition of Care [facility]) CM/SW Contact:  Beverly Sessions, RN Phone Number: 02/10/2021, 10:42 AM   Clinical Narrative:     Patient to discharge home today Wife to pick up at discharge.  Wife express concerns about patient's confusion this morning.  Notified MD and bedside RN. Per MD this is patient's baseline  [place] with [facility] notified of discharge. She is aware of wound care Reached out again to [facility] with [employer] to see if patient will qualify for new WC.  Will continue to follow up   Outpatient palliative referral made to [facility] with [facility]   Update: patient not eligible for new WC at this time.  Wife updated      Barriers to Discharge: Continued Medical Work up   Patient Goals and CMS Choice Patient states their goals for this hospitalization and ongoing recovery are:: to return home CMS Medicare.gov Compare Post Acute Care list provided to:: Patient Represenative (must comment) Choice offered to / list presented to : Spouse  Discharge Placement                       Discharge Plan and Services                                     Social Determinants of Health (SDOH) Interventions     Readmission Risk Interventions Readmission Risk Prevention Plan 02/10/2021 02/08/2021 11/21/2020  Transportation Screening Complete Complete Complete  PCP or Specialist Appt within 5-7 Days - Complete -  Home Care Screening - Complete -  Medication Review (RN CM) - Complete -  HRI or Home Care Consult Complete - -  Palliative Care Screening Complete - -  Medication Review (RN Care Manager) Complete - -  Some recent data might be hidden

## 2021-02-10 NOTE — Discharge Instructions (Signed)

## 2021-02-10 NOTE — TOC Transition Note (Addendum)
Transition of Care [facility]) - CM/SW Discharge Note   Patient Details  Name: [name] MRN: [id number] Date of Birth: 13-Mar-1961  Transition of Care [facility]) CM/SW Contact:  Beverly Sessions, RN Phone Number: 02/10/2021, 9:31 AM   Clinical Narrative:     Patient to discharge home today Wife to pick up at discharge.  Wife express concerns about patient's confusion this morning.  Notified MD and bedside RN. Per MD this is patient's baseline  [place] with [facility] notified of discharge. She is aware of wound care Reached out again to [facility] with [employer] to see if patient will qualify for new WC.  Will continue to follow up   Outpatient palliative referral made to [facility] with [facility]     Barriers to Discharge: Continued Medical Work up   Patient Goals and CMS Choice Patient states their goals for this hospitalization and ongoing recovery are:: to return home CMS Medicare.gov Compare Post Acute Care list provided to:: Patient Represenative (must comment) Choice offered to / list presented to : Spouse  Discharge Placement                       Discharge Plan and Services                                     Social Determinants of Health (SDOH) Interventions     Readmission Risk Interventions Readmission Risk Prevention Plan 02/10/2021 02/08/2021 11/21/2020  Transportation Screening Complete Complete Complete  PCP or Specialist Appt within 5-7 Days - Complete -  Home Care Screening - Complete -  Medication Review (RN CM) - Complete -  HRI or Home Care Consult Complete - -  Palliative Care Screening Complete - -  Medication Review (RN Care Manager) Complete - -  Some recent data might be hidden

## 2021-02-10 NOTE — Progress Notes (Signed)
[place] [facility]) Hospital Liaison RN note:  Received new referral for [facility] out patient palliative program to follow post discharge from [facility], TOC. Patient information given to referral. Plan is to discharge home today. We will follow for disposition.  Thank you for this referral.  Zandra Abts, RN [facility] Liaison [phone]

## 2021-02-10 NOTE — Progress Notes (Addendum)
[name], [place] 02/10/21  Subjective:   Hospital day # 4  Patient seen sitting in chair Completed breakfast Alert and oriented Denies shortness of breath   Renal: 04/25 0701 - 04/26 0700 In: 1646.6 [P.O.:240; I.V.:706.6; IV Piggyback:700] Out: 1784  Lab Results  Component Value Date   CREATININE 7.14 (H) 02/09/2021   CREATININE 6.26 (H) 02/08/2021   CREATININE 4.77 (H) 02/07/2021     Objective:  Vital signs in last 24 hours:  Temp:  [98.3 F (36.8 C)-99.5 F (37.5 C)] 98.7 F (37.1 C) (04/26 0700) Pulse Rate:  [63-85] 63 (04/26 0700) Resp:  [16-25] 18 (04/26 0700) BP: (157-213)/(61-159) 166/73 (04/26 0700) SpO2:  [94 %-97 %] 95 % (04/26 0700)  Weight change:  Filed Weights   02/06/21 1733  Weight: 67.6 kg    Intake/Output:    Intake/Output Summary (Last 24 hours) at 02/10/2021 0945 Last data filed at 02/10/2021 0400 Gross per 24 hour  Intake 1646.55 ml  Output 1784 ml  Net -137.45 ml     Physical Exam: General:  No acute distress, sitting in chair  HEENT  anicteric, moist oral mucous membrane  Pulm/lungs  normal breathing pattern,Clear breath sounds   CVS/Heart  regular rhythm, no rub or gallop  Abdomen:   Soft, nontender  Extremities:  No peripheral edema  Neurologic:  Alert, oriented, able to follow commands  Skin:  No acute rashes  Access: Lt AVF     Basic Metabolic Panel:  Recent Labs  Lab 02/06/21 1756 02/07/21 0414 02/07/21 1045 02/08/21 0450 02/09/21 0448  NA 139 137  --  134* 129*  K 3.0* 2.7* 3.9 4.4 4.4  CL 97* 93*  --  95* 92*  CO2 29 28  --  25 22  GLUCOSE 260* 262*  --  152* 170*  BUN 19 25*  --  33* 45*  CREATININE 3.76* 4.77*  --  6.26* 7.14*  CALCIUM 8.7* 8.7*  --  8.6* 8.2*  MG  --  1.6*  --   --   --      CBC: Recent Labs  Lab 02/06/21 1756 02/07/21 0414 02/08/21 0450 02/09/21 0448  WBC 18.4* 14.0* 8.7 10.5  NEUTROABS 16.9*  --   --   --   HGB 9.2* 8.8* 8.9* 9.0*  HCT  29.7* 28.4* 28.2* 29.5*  MCV 84.4 85.5 84.4 85.3  PLT 131* 119* 130* 135*      Lab Results  Component Value Date   HEPBSAG NON REACTIVE 02/09/2021   HEPBSAB Reactive (A) 02/09/2021      Microbiology:  Recent Results (from the past 240 hour(s))  Blood culture (single)     Status: None (Preliminary result)   Collection Time: 02/06/21  5:49 PM   Specimen: BLOOD  Result Value Ref Range Status   Specimen Description BLOOD RIGHT FA  Final   Special Requests   Final    BOTTLES DRAWN AEROBIC AND ANAEROBIC Blood Culture adequate volume   Culture   Final    NO GROWTH 4 DAYS Performed at [facility], [address], [place], [place] [address]    Report Status PENDING  Incomplete  Resp Panel by RT-PCR (Flu A&B, Covid) Nasopharyngeal Swab     Status: None   Collection Time: 02/06/21  6:45 PM   Specimen: Nasopharyngeal Swab; Nasopharyngeal(NP) swabs in vial transport medium  Result Value Ref Range Status   SARS Coronavirus 2 by RT PCR NEGATIVE NEGATIVE Final    Comment: (NOTE)  SARS-CoV-2 target nucleic acids are NOT DETECTED.  The SARS-CoV-2 RNA is generally detectable in upper respiratory specimens during the acute phase of infection. The lowest concentration of SARS-CoV-2 viral copies this assay can detect is 138 copies/mL. A negative result does not preclude SARS-Cov-2 infection and should not be used as the sole basis for treatment or other patient management decisions. A negative result may occur with  improper specimen collection/handling, submission of specimen other than nasopharyngeal swab, presence of viral mutation(s) within the areas targeted by this assay, and inadequate number of viral copies(<138 copies/mL). A negative result must be combined with clinical observations, patient history, and epidemiological information. The expected result is Negative.  Fact Sheet for Patients:  EntrepreneurPulse.com.au  Fact Sheet for Healthcare  Providers:  IncredibleEmployment.be  This test is no t yet approved or cleared by the [place] FDA and  has been authorized for detection and/or diagnosis of SARS-CoV-2 by FDA under an Emergency Use Authorization (EUA). This EUA will remain  in effect (meaning this test can be used) for the duration of the COVID-19 declaration under Section 564(b)(1) of the Act, 21 U.S.C.section 360bbb-3(b)(1), unless the authorization is terminated  or revoked sooner.       Influenza A by PCR NEGATIVE NEGATIVE Final   Influenza B by PCR NEGATIVE NEGATIVE Final    Comment: (NOTE) The Xpert Xpress SARS-CoV-2/FLU/RSV plus assay is intended as an aid in the diagnosis of influenza from Nasopharyngeal swab specimens and should not be used as a sole basis for treatment. Nasal washings and aspirates are unacceptable for Xpert Xpress SARS-CoV-2/FLU/RSV testing.  Fact Sheet for Patients: EntrepreneurPulse.com.au  Fact Sheet for Healthcare Providers: IncredibleEmployment.be  This test is not yet approved or cleared by the [place] FDA and has been authorized for detection and/or diagnosis of SARS-CoV-2 by FDA under an Emergency Use Authorization (EUA). This EUA will remain in effect (meaning this test can be used) for the duration of the COVID-19 declaration under Section 564(b)(1) of the Act, 21 U.S.C. section 360bbb-3(b)(1), unless the authorization is terminated or revoked.  Performed at [facility], [address] [place], [place] [address]   Urine culture     Status: Abnormal   Collection Time: 02/06/21  8:41 PM   Specimen: Urine, Random  Result Value Ref Range Status   Specimen Description   Final    URINE, RANDOM Performed at [facility], [address], [place], [place] [address]    Special Requests   Final    NONE Performed at [facility], [address] [place], [place] [address]     Culture (A)  Final    <10,000 COLONIES/mL INSIGNIFICANT GROWTH Performed at [facility], [place] [address] [place], [place] [address]    Report Status 02/08/2021 FINAL  Final  Aerobic/Anaerobic Culture w Gram Stain (surgical/deep wound)     Status: None (Preliminary result)   Collection Time: 02/08/21  2:57 PM   Specimen: Buttocks; Abscess  Result Value Ref Range Status   Specimen Description   Final    BUTTOCKS Performed at [facility], [address] [place],  [address]    Special Requests   Final    Immunocompromised Performed at [facility], [place], [place] [address]    Gram Stain   Final    FEW WBC PRESENT, PREDOMINANTLY PMN RARE GRAM POSITIVE COCCI IN PAIRS IN CLUSTERS    Culture   Final    NO GROWTH <  24 HOURS Performed at [facility], [place] [address], [place], [place] [address]    Report Status PENDING  Incomplete    Coagulation Studies: No results for input(s): LABPROT, INR in the last 72 hours.  Urinalysis: No results for input(s): COLORURINE, LABSPEC, PHURINE, GLUCOSEU, HGBUR, BILIRUBINUR, KETONESUR, PROTEINUR, UROBILINOGEN, NITRITE, LEUKOCYTESUR in the last 72 hours.  Invalid input(s): APPERANCEUR    Imaging: No results found.   Medications:   . sodium chloride 50 mL/hr at 02/09/21 1854  . cefTRIAXone (ROCEPHIN)  IV 2 g (02/09/21 2315)  . linezolid (ZYVOX) IV 600 mg (02/10/21 0330)   . amLODipine  10 mg Oral Daily  . atorvastatin  80 mg Oral Daily  . carvedilol  25 mg Oral BID  . Chlorhexidine Gluconate Cloth  6 each Topical Q0600  . cloNIDine  0.1 mg Oral BID  . diphenhydrAMINE  25 mg Intravenous Once  . epoetin (EPOGEN/PROCRIT) injection  10,000 Units Intravenous Q M,W,F-HD  . gabapentin  300 mg Oral QHS  . heparin  5,000 Units Subcutaneous Q8H  . hydrALAZINE  50 mg Oral Q6H  .  HYDROmorphone (DILAUDID) injection  0.5 mg Intravenous Once  . insulin aspart  0-5 Units Subcutaneous QHS  .  insulin aspart  0-9 Units Subcutaneous TID WC  . levETIRAcetam  500 mg Oral Q M,W,F-1800  . levETIRAcetam  500 mg Oral Daily   acetaminophen **OR** acetaminophen, HYDROcodone-acetaminophen, ketorolac, ondansetron **OR** ondansetron (ZOFRAN) IV  Assessment/ Plan:  60 y.o. male with DM, HTN, ESRD on HD, seizure disorder, CVA, hydradenitis suppurativa and cutaneous Crohn's on [name] and as needed Augmentin for flareups, followed by [facility] dermatology, status post recent hospitalization for GI bleed, presenting with altered mental status/possible seizure, who was brought to the emergency room with altered mental status and confusion following dialysis earlier in the day.  admitted on 02/06/2021 for Transient alteration of awareness [R40.4] Sepsis ([place]) [A41.9] Sepsis without acute organ dysfunction, due to unspecified organism ([place]) [A41.9]   # ESRD HD MWF Received dialysis yesterday Became short of breath and hypertensive Received Clonidine and Hydralazine Treatment completed with UF of 1.7L Next scheduled treatment tomorrow    # Acute metabolic encephalopathy - ? Cause due to Seizure ( treated with Keppra) and/or sepsis  # Anemia of CKD Lab Results  Component Value Date   HGB 9.0 (L) 02/09/2021    - Continue scheduled EPO  # Secondary hyperparathyroidism of renal origin N 25.81  Lab Results  Component Value Date   PTH 283 (H) 01/08/2019   CALCIUM 8.2 (L) 02/09/2021   CAION 1.10 (L) 09/12/2020   PHOS 3.7 11/21/2020   monitor phos and Ca this admission  # Sepsis secondary to rt buttock cellulitis continued on Rocephin and Zyvox - dose with pharmacy assistance  # HTN urgency Given iv hydralazine and clonidine during HD yesterday Improved Bps 140s/50s Clonidine at discharge    LOS: 4 Shantelle Breeze 4/26/20229:45 AM  Patient was seen and evaluated with Colon Flattery, NP.  Plan of care was discussed with patient as well as NP.  I agree with the note as documented.    [place], [place]  Note: This note was prepared with Dragon dictation. Any transcription errors are unintentional

## 2021-02-11 ENCOUNTER — Telehealth: Payer: Self-pay

## 2021-02-11 DIAGNOSIS — Z794 Long term (current) use of insulin: Secondary | ICD-10-CM

## 2021-02-11 DIAGNOSIS — G40909 Epilepsy, unspecified, not intractable, without status epilepticus: Secondary | ICD-10-CM

## 2021-02-11 DIAGNOSIS — E1129 Type 2 diabetes mellitus with other diabetic kidney complication: Secondary | ICD-10-CM

## 2021-02-11 DIAGNOSIS — M9979 Connective tissue and disc stenosis of intervertebral foramina of abdomen and other regions: Secondary | ICD-10-CM

## 2021-02-11 DIAGNOSIS — D631 Anemia in chronic kidney disease: Secondary | ICD-10-CM

## 2021-02-11 DIAGNOSIS — K50919 Crohn's disease, unspecified, with unspecified complications: Secondary | ICD-10-CM

## 2021-02-11 DIAGNOSIS — I5022 Chronic systolic (congestive) heart failure: Secondary | ICD-10-CM

## 2021-02-11 LAB — CULTURE, BLOOD (SINGLE)
Culture: NO GROWTH
Special Requests: ADEQUATE

## 2021-02-11 NOTE — Discharge Summary (Signed)
[facility] [place] NAME: [name]    MR#:  [id number]  DATE OF BIRTH:  1961/01/13  DATE OF ADMISSION:  02/06/2021   ADMITTING PHYSICIAN: Athena Masse, MD  DATE OF DISCHARGE: 02/10/2021  4:49 PM  PRIMARY CARE PHYSICIAN: [name], MD   ADMISSION DIAGNOSIS:  Transient alteration of awareness [R40.4] Sepsis ([place]) [A41.9] Sepsis without acute organ dysfunction, due to unspecified organism ([place]) [A41.9] DISCHARGE DIAGNOSIS:  Principal Problem:   Acute metabolic encephalopathy Active Problems:   Hypertension   Anemia due to chronic kidney disease   Hidradenitis suppurativa   Type 2 diabetes mellitus with kidney complication, with long-term current use of insulin (HCC)   End stage renal failure on dialysis ([place])   History of CVA (cerebrovascular accident)   Seizure disorder ([place])   S/P bilateral BKA (below knee amputation) ([place])   Sepsis ([place])   Transient alteration of awareness   Chronic systolic CHF (congestive heart failure) (HCC)   History of GI bleed   Long term current use of immunosuppressive drug   Disorder of skin due to Crohn's disease ([place])   Cellulitis and abscess of buttock  SECONDARY DIAGNOSIS:   Past Medical History:  Diagnosis Date  . Acute metabolic encephalopathy 01/25/4495  . Anemia   . Crohn disease ([place])   . Diabetes mellitus without complication ([place])   . DVT of lower extremity (deep venous thrombosis) ([place]) 2016  . Empyema ([place]) 05/20/2017  . Encephalopathy 12/04/2017  . Fall at home, initial encounter 09/12/2020  . Hidradenitis suppurativa   . Hypertension   . ICH (intracerebral hemorrhage) ([place])   . Peritonitis ([place]) 04/21/2017  . Pyogenic arthritis of knee ([place]) 02/04/2016  . Renal disorder   . Sepsis ([place]) 01/12/2018  . Stroke [facility])    HOSPITAL COURSE:  60 y.o.malewith medical history significant forDM, HTN, ESRD on HDMWF, seizure disorder, CVA, hydradenitis suppurativa and cutaneous Crohn's on [name]  and as needed Augmentin for flareups, followed by [facility] dermatology, status post recent hospitalization for GI bleed admitted for altered mental status and confusion following dialysis earlier in the day concerning for seizure to his spouse  Acute metabolic encephalopathy present on admission and now resolved Vascular dementia Breakthrough seizure - due to acute infection with underlying vascular dementia and multiple medical comorbidities including ESRD -CT head showing no acute intracranial findings - Outpatient neurology follow-up for evaluation and management of vascular dementia  Sepsis secondary to cellulitis/abscess right buttock - POA and sepsis resolved Hidradenitis suppurativa Long term current use of immunosuppressive drug/[name] Disorder of skin due to Crohn's disease/cutaneous Crohn's -Patient has chronic wound with drainage of left buttock related to his hidradenitis suppurativa and is followed by [facility] dermatology and takes Augmentin as needed for flareups -At high risk for infection due to being on long-term [name] -CT abdomen and pelvis showed no acute process - treated withIV Zyvox (vancomycinallergy)and ceftriaxone for now. Augmentin at DC - s/p I & d on 4/24  Hypokalemia/hypomagnesemia Repleted and resolved  EssentialHypertension - controlled  ESRD on HD MWF Anemia due to renal disease -dialyzed while in the hospital.  Type 2 diabetes mellitus with kidney complication, with long-term current use of insulin ([place]) - covered with Sliding scale while in the hospital  History of CVA (cerebrovascular accident) Post stroke seizure disorder and cognitive deficit -CT head with no acute findings and patient with no focal deficit  Seizure disorderstatus post CVA(HCC) Breakthrough seizure in the setting of underlying infection versus dialysis disequilibrium  syndrome -Patient had episodes of confusion in November 2021, again in  February 2022 -Last EEG November 2021 was normal -Continue Keppra 500 twice daily with an extra dose postdialysis per last neurology note -neurology seen-Dr. Rory Percy recommends continuing current dose of Keppra and outpatient neurology follow-up  S/P bilateral BKA (below knee amputation) ([place]) -Increase nursing assistance for transfers. Trapeze above bed ifdesired.  Patient is requesting new wheelchair.  TOC has arranged for same  Chronic combinedsystolicand diastolicCHF (congestive heart failure) ([place]) -Not acutely exacerbated -Last echo November 2021 with EF 40 to 45% and grade 2 diastolic dysfunction  History of GI bleed -Recent GI bleed in February 2022, requiring 2 units PRBC -Hemoglobin at baseline and stable this admission  Hyponatremia Corrected with dialysis   DISCHARGE CONDITIONS:  stable CONSULTS OBTAINED:   DRUG ALLERGIES:   Allergies  Allergen Reactions  . Methotrexate Other (See Comments)    Blood count drops  . Vancomycin Shortness Of Breath    Eyes watering, SOB, wheezing  . Cefepime Other (See Comments)  . Tape    DISCHARGE MEDICATIONS:   Allergies as of 02/10/2021      Reactions   Methotrexate Other (See Comments)   Blood count drops   Vancomycin Shortness Of Breath   Eyes watering, SOB, wheezing   Cefepime Other (See Comments)   Tape       Medication List    TAKE these medications   acetaminophen 500 MG tablet Commonly known as: TYLENOL Take 1,000 mg by mouth daily as needed for moderate pain or headache.   Alcohol Swabs Pads Use as directed to check blood sugar three times daily for insulin dependent type 2 diabetes.   amLODipine 10 MG tablet Commonly known as: NORVASC TAKE 1 TABLET(10 MG) BY MOUTH DAILY AS NEEDED What changed: See the new instructions.   amoxicillin-clavulanate 875-125 MG tablet Commonly known as: Augmentin Take 1 tablet by mouth every 12 (twelve) hours for 5 days. What changed: See the new  instructions.   atorvastatin 80 MG tablet Commonly known as: LIPITOR TAKE 1 TABLET(80 MG) BY MOUTH DAILY What changed: See the new instructions.   Auryxia 1 GM 210 MG(Fe) tablet Generic drug: ferric citrate Take 420 mg by mouth in the morning and at bedtime.   carvedilol 25 MG tablet Commonly known as: COREG Take 1 tablet (25 mg total) by mouth 2 (two) times daily.   cloNIDine 0.1 MG tablet Commonly known as: CATAPRES Take 1 tablet (0.1 mg total) by mouth 2 (two) times daily.   cyanocobalamin 1000 MCG/ML injection Commonly known as: (VITAMIN B-12) Inject 47m once a week for 4 weeks and then once a month for 3 months   furosemide 80 MG tablet Commonly known as: LASIX TAKE 1 TABLET(80 MG) BY MOUTH TWICE DAILY What changed: See the new instructions.   gabapentin 100 MG capsule Commonly known as: NEURONTIN Take 100 mg by mouth at bedtime. Take along with 3065mcapsule at bedtime   gabapentin 300 MG capsule Commonly known as: NEURONTIN Take 1 capsule (300 mg total) by mouth at bedtime.   [name] Pen 40 MG/0.4ML Pnkt Generic drug: Adalimumab Inject 40 mg into the muscle once a week.   hydrALAZINE 50 MG tablet Commonly known as: APRESOLINE Take 1 tablet (50 mg total) by mouth every 8 (eight) hours.   levETIRAcetam 500 MG tablet Commonly known as: KEPPRA Take 1 tablet (500 mg total) by mouth daily AND 1 tablet (500 mg total) every Monday, Wednesday, and Friday. After dialysis.  lisinopril 40 MG tablet Commonly known as: ZESTRIL Take 40 mg by mouth daily.   ONE TOUCH ULTRA 2 w/Device Kit Use as directed to check blood sugar three times daily. E11.9   sevelamer carbonate 800 MG tablet Commonly known as: RENVELA TAKE 1 TABLET(800 MG) BY MOUTH THREE TIMES DAILY What changed: See the new instructions.   spironolactone 25 MG tablet Commonly known as: ALDACTONE Take 1 tablet by mouth at bedtime.            Discharge Care Instructions  (From admission, onward)          Start     Ordered   02/10/21 0000  Discharge wound care:       Comments: Keep the dressing intact till follow-up with [facility] dermatology.  Change the dressing every other day if soiled to dry dressing   02/10/21 0818         DISCHARGE INSTRUCTIONS:   DIET:  Renal diet DISCHARGE CONDITION:  Stable ACTIVITY:  Activity as tolerated OXYGEN:  Home Oxygen: No.  Oxygen Delivery: room air DISCHARGE LOCATION:  home with [facility] RN and Palliative care to follow  If you experience worsening of your admission symptoms, develop shortness of breath, life threatening emergency, suicidal or homicidal thoughts you must seek medical attention immediately by calling 911 or calling your MD immediately  if symptoms less severe.  You Must read complete instructions/literature along with all the possible adverse reactions/side effects for all the Medicines you take and that have been prescribed to you. Take any new Medicines after you have completely understood and accpet all the possible adverse reactions/side effects.   Please note  You were cared for by a hospitalist during your hospital stay. If you have any questions about your discharge medications or the care you received while you were in the hospital after you are discharged, you can call the unit and asked to speak with the hospitalist on call if the hospitalist that took care of you is not available. Once you are discharged, your primary care physician will handle any further medical issues. Please note that NO REFILLS for any discharge medications will be authorized once you are discharged, as it is imperative that you return to your primary care physician (or establish a relationship with a primary care physician if you do not have one) for your aftercare needs so that they can reassess your need for medications and monitor your lab values.    On the day of Discharge:  VITAL SIGNS:  Blood pressure (!) 149/68, pulse 61, temperature 98 F  (36.7 C), temperature source Oral, resp. rate 18, height 5' 11"  (1.803 m), weight 67.6 kg, SpO2 97 %. PHYSICAL EXAMINATION:  GENERAL:  60 y.o.-year-old patient lying in the bed with no acute distress.  Cardiovascular S1-S2 normal, no murmur rales or gallop Lungs clear to auscultation bilaterally no wheezing rales rhonchi crepitation Abdomen soft nontender nondistended bowel sounds present Neurologically nonfocal exam.  He is slow in his responses likely mild cognitive issues DATA REVIEW:   CBC Recent Labs  Lab 02/09/21 0448  WBC 10.5  HGB 9.0*  HCT 29.5*  PLT 135*    Chemistries  Recent Labs  Lab 02/06/21 1756 02/07/21 0414 02/07/21 1045 02/09/21 0448  NA 139 137   < > 129*  K 3.0* 2.7*   < > 4.4  CL 97* 93*   < > 92*  CO2 29 28   < > 22  GLUCOSE 260* 262*   < >  170*  BUN 19 25*   < > 45*  CREATININE 3.76* 4.77*   < > 7.14*  CALCIUM 8.7* 8.7*   < > 8.2*  MG  --  1.6*  --   --   AST 13*  --   --   --   ALT 8  --   --   --   ALKPHOS 64  --   --   --   BILITOT 0.9  --   --   --    < > = values in this interval not displayed.     Outpatient follow-up  Follow-up Information    [name], MD. Schedule an appointment as soon as possible for a visit in 1 week(s).   Specialty: Family Medicine Contact information: [address] [place] [address] [phone]        Enriqueta Shutter, MD. Schedule an appointment as soon as possible for a visit in 1 week(s).   Specialty: Dermatology Contact information: [address] [place] [address] [phone]        Vladimir Crofts, MD. Schedule an appointment as soon as possible for a visit in 3 week(s).   Specialty: Neurology Contact information: [place] [facility] [place] [address] [phone]               72 Day Unplanned Readmission Risk Score   Flowsheet Row ED to Hosp-Admission (Discharged) from 02/06/2021 in [facility]  30 Day Unplanned Readmission Risk Score (%) 23.87 Filed at 02/10/2021 1600     This score is the patient's risk of an unplanned readmission within 30 days of being discharged (0 -100%). The score is based on dignosis, age, lab data, medications, orders, and past utilization.   Low:  0-14.9   Medium: 15-21.9   High: 22-29.9   Extreme: 30 and above         Management plans discussed with the patient, family and they are in agreement.  CODE STATUS: Prior   TOTAL TIME TAKING CARE OF THIS PATIENT: 45 minutes.    Max Sane M.D on 02/11/2021 at 4:01 PM  [facility]   CC: Primary care physician; [name], MD   Note: This dictation was prepared with Dragon dictation along with smaller phrase technology. Any transcriptional errors that result from this process are unintentional.

## 2021-02-11 NOTE — Telephone Encounter (Signed)
Copied from [place] [id number]. Topic: General - Other >> Feb 11, 2021  4:20 PM Pawlus, Brayton Layman A wrote: Reason for CRM: [facility] called stating the pt needs an order for palliative care, please advise.

## 2021-02-12 ENCOUNTER — Telehealth: Payer: Self-pay

## 2021-02-12 NOTE — Addendum Note (Signed)
Addended by: [name] on: 02/12/2021 09:37 AM   Modules accepted: Orders

## 2021-02-12 NOTE — Telephone Encounter (Signed)
Copied from [place] [id number]. Topic: Quick Communication - Appointment Cancellation >> Feb 12, 2021  4:00 PM Loma [place] wrote: Patient  wife called to  scheduled for HFU Patient HAS [id number]}  been scheduled for their appointment.Template is just letting sch HFU on MWF Pt has dialysis on MWF and needs a Tues or Thurs. Pls call [name], wife, back with a possible Tues,Thur date per Arbie Cookey. Pt discharged [facility] 4/26  Route to department's PEC pool.

## 2021-02-13 ENCOUNTER — Other Ambulatory Visit: Payer: Self-pay | Admitting: Surgery

## 2021-02-13 ENCOUNTER — Telehealth: Payer: Self-pay | Admitting: Nurse Practitioner

## 2021-02-13 ENCOUNTER — Ambulatory Visit: Payer: Medicare Other

## 2021-02-13 DIAGNOSIS — N186 End stage renal disease: Secondary | ICD-10-CM

## 2021-02-13 DIAGNOSIS — Z992 Dependence on renal dialysis: Secondary | ICD-10-CM

## 2021-02-13 LAB — AEROBIC/ANAEROBIC CULTURE W GRAM STAIN (SURGICAL/DEEP WOUND)

## 2021-02-13 MED ORDER — CIPROFLOXACIN HCL 500 MG PO TABS: 500.0000 mg | ORAL_TABLET | Freq: Every day | ORAL | 0 refills | Status: AC

## 2021-02-13 NOTE — Progress Notes (Signed)
02/13/21  Culture data from I&D of buttocks abscess came back, showing E coli, which is resistant to the Augmentin the patient is currently taking.  Discussed with his wife about results and new prescription for Cipro will be sent, 500 mg daily for 5 days (renally dosed).  Discussed with her that on her next visit to [facility] D[facility]atology, she should mention that the bacteria was resistant to Augmentin, and he may need a new antibiotic for his flare-up episodes.  Olean Ree, MD

## 2021-02-13 NOTE — Telephone Encounter (Signed)
Spoke with patient's wife, [name], regarding the Palliative referral/services and all questions were answered and she was in agreement with scheduling visit.  I have scheduled an In-home Consult for 03/05/21 @ 10:30 AM

## 2021-02-13 NOTE — Telephone Encounter (Signed)
Appointment scheduled for Tuesday, 02/17/2021 at 11am. Patient's wife advised of appointment.

## 2021-02-16 ENCOUNTER — Encounter: Payer: Medicare Other | Admitting: Family Medicine

## 2021-02-17 ENCOUNTER — Ambulatory Visit (INDEPENDENT_AMBULATORY_CARE_PROVIDER_SITE_OTHER): Payer: Medicare Other | Admitting: Family Medicine

## 2021-02-17 ENCOUNTER — Encounter: Payer: Self-pay | Admitting: Family Medicine

## 2021-02-17 ENCOUNTER — Other Ambulatory Visit: Payer: Self-pay

## 2021-02-17 VITALS — BP 163/73 | HR 62 | Temp 98.0°F | Wt 143.8 lb

## 2021-02-17 DIAGNOSIS — R630 Anorexia: Secondary | ICD-10-CM

## 2021-02-17 DIAGNOSIS — I132 Hypertensive heart and chronic kidney disease with heart failure and with stage 5 chronic kidney disease, or end stage renal disease: Secondary | ICD-10-CM | POA: Diagnosis not present

## 2021-02-17 DIAGNOSIS — G40909 Epilepsy, unspecified, not intractable, without status epilepticus: Secondary | ICD-10-CM

## 2021-02-17 DIAGNOSIS — K5090 Crohn's disease, unspecified, without complications: Secondary | ICD-10-CM

## 2021-02-17 DIAGNOSIS — L03317 Cellulitis of buttock: Secondary | ICD-10-CM

## 2021-02-17 DIAGNOSIS — I5022 Chronic systolic (congestive) heart failure: Secondary | ICD-10-CM

## 2021-02-17 DIAGNOSIS — D631 Anemia in chronic kidney disease: Secondary | ICD-10-CM

## 2021-02-17 DIAGNOSIS — N186 End stage renal disease: Secondary | ICD-10-CM

## 2021-02-17 DIAGNOSIS — E1122 Type 2 diabetes mellitus with diabetic chronic kidney disease: Secondary | ICD-10-CM

## 2021-02-17 DIAGNOSIS — I69354 Hemiplegia and hemiparesis following cerebral infarction affecting left non-dominant side: Secondary | ICD-10-CM

## 2021-02-17 DIAGNOSIS — G9341 Metabolic encephalopathy: Secondary | ICD-10-CM

## 2021-02-17 DIAGNOSIS — E1151 Type 2 diabetes mellitus with diabetic peripheral angiopathy without gangrene: Secondary | ICD-10-CM

## 2021-02-17 MED ORDER — CYPROHEPTADINE HCL 4 MG PO TABS: 4.0000 mg | ORAL_TABLET | Freq: Two times a day (BID) | ORAL | 2 refills | Status: DC

## 2021-02-17 NOTE — Progress Notes (Signed)
Established patient visit   Patient: [name]   DOB: 11/29/1960   60 y.o. Male  MRN: [id number] Visit Date: 02/17/2021  Today's healthcare provider: [name], MD   Chief Complaint  Patient presents with  . Hospitalization Follow-up   I,Porsha C McClurkin,acting as a scribe for [name], MD.,have documented all relevant documentation on the behalf of [name], MD,as directed by  [name], MD while in the presence of [name], MD.  Subjective    HPI  Follow up Hospitalization  Patient was admitted to [facility] on 02/06/2021 and discharged on 02/10/2021. He was treated for acute metabolic encephalopathy, sepsis, and cellulitis and abscess of buttock. Treatment for this included treating withIV Zyvox (vancomycinallergy)and ceftriaxone.  Augmentin was prescribed at discharge. Telephone follow up was not done. He reports good compliance with treatment. He reports this condition is improved.  ----------------------------------------------------------------------------------------- -      Medications: Outpatient Medications Prior to Visit  Medication Sig  . acetaminophen (TYLENOL) 500 MG tablet Take 1,000 mg by mouth daily as needed for moderate pain or headache.   . Adalimumab ([name] PEN) 40 MG/0.4ML PNKT Inject 40 mg into the muscle once a week.   . Alcohol Swabs PADS Use as directed to check blood sugar three times daily for insulin dependent type 2 diabetes.  Marland Kitchen amLODipine (NORVASC) 10 MG tablet TAKE 1 TABLET(10 MG) BY MOUTH DAILY AS NEEDED (Patient taking differently: Take 10 mg by mouth at bedtime.)  . atorvastatin (LIPITOR) 80 MG tablet TAKE 1 TABLET(80 MG) BY MOUTH DAILY (Patient taking differently: Take 80 mg by mouth daily.)  . AURYXIA 1 GM 210 MG(Fe) tablet Take 420 mg by mouth in the morning and at bedtime.   . Blood Glucose Monitoring Suppl (ONE TOUCH ULTRA 2) w/Device KIT Use as directed to check blood sugar three times daily. E11.9   . carvedilol (COREG) 25 MG tablet Take 1 tablet (25 mg total) by mouth 2 (two) times daily.  . ciprofloxacin (CIPRO) 500 MG tablet Take 1 tablet (500 mg total) by mouth daily for 5 days.  . cloNIDine (CATAPRES) 0.1 MG tablet Take 1 tablet (0.1 mg total) by mouth 2 (two) times daily.  . cyanocobalamin (,VITAMIN B-12,) 1000 MCG/ML injection Inject 25m once a week for 4 weeks and then once a month for 3 months  . furosemide (LASIX) 80 MG tablet TAKE 1 TABLET(80 MG) BY MOUTH TWICE DAILY (Patient taking differently: Take 80 mg by mouth 2 (two) times daily.)  . gabapentin (NEURONTIN) 100 MG capsule Take 100 mg by mouth at bedtime. Take along with 308mcapsule at bedtime  . gabapentin (NEURONTIN) 300 MG capsule Take 1 capsule (300 mg total) by mouth at bedtime.  . hydrALAZINE (APRESOLINE) 50 MG tablet Take 1 tablet (50 mg total) by mouth every 8 (eight) hours.  . levETIRAcetam (KEPPRA) 500 MG tablet Take 1 tablet (500 mg total) by mouth daily AND 1 tablet (500 mg total) every Monday, Wednesday, and Friday. After dialysis.  . Marland Kitchenisinopril (ZESTRIL) 40 MG tablet Take 40 mg by mouth daily.  . sevelamer carbonate (RENVELA) 800 MG tablet TAKE 1 TABLET(800 MG) BY MOUTH THREE TIMES DAILY (Patient taking differently: Take 800 mg by mouth 3 (three) times daily with meals.)  . spironolactone (ALDACTONE) 25 MG tablet Take 1 tablet by mouth at bedtime.   No facility-administered medications prior to visit.    Review of Systems  Constitutional: Negative for appetite change, chills and fever.  Respiratory:  Negative for chest tightness, shortness of breath and wheezing.   Cardiovascular: Negative for chest pain and palpitations.  Gastrointestinal: Negative for abdominal pain, nausea and vomiting.       Objective    BP (!) 163/73 (BP Location: Right Arm, Patient Position: Sitting, Cuff Size: Normal)   Pulse 62   Temp 98 F (36.7 C) (Oral)   Wt 143 lb 12.8 oz (65.2 kg)   SpO2 100%   BMI 20.06 kg/m      Physical Exam   General: Appearance:    Well developed, well nourished male in no acute distress  Eyes:    PERRL, conjunctiva/corneas clear, EOM's intact       Lungs:     Clear to auscultation bilaterally, respirations unlabored  Heart:    Normal heart rate. Normal rhythm. No murmurs, rubs, or gallops.   MS:   Below knee amputation of left lower extremity is noted. Below knee amputation of right lower extremity is noted.   Skin:   Wound is clean and dry minimal erythema.         Assessment & Plan     1. Cellulitis of buttock Healing well, no additional antibiotic needed.   2. Anorexia try- cyproheptadine (PERIACTIN) 4 MG tablet; Take 1 tablet (4 mg total) by mouth 2 (two) times daily. Before meals  Dispense: 60 tablet; Refill: 2   Continue follow up renal and vascular as scheduled. Follow up here in months.        The entirety of the information documented in the History of Present Illness, Review of Systems and Physical Exam were personally obtained by me. Portions of this information were initially documented by the CMA and reviewed by me for thoroughness and accuracy.      [name], MD  [facility] [phone] (phone) [phone] (fax)  [place]

## 2021-02-17 NOTE — Patient Instructions (Signed)
.   Please review the attached list of medications and notify my office if there are any errors.   . Check with palliative care about speech therapy for trouble swallowing food.

## 2021-02-18 ENCOUNTER — Telehealth (INDEPENDENT_AMBULATORY_CARE_PROVIDER_SITE_OTHER): Payer: Self-pay

## 2021-02-18 NOTE — Telephone Encounter (Signed)
Spoke with the patient and he is scheduled with Dr. Delana Meyer for a left arm fistulagram on 02/24/21 with a 1:30 pm arrival time to the [facility]. Covid testing on 02/20/21 between 8-2 pm at the [facility]. Pre-procedure instructions were discussed and will be mailed.

## 2021-02-19 NOTE — Chronic Care Management (AMB) (Signed)
  Chronic Care Management   Note   Name: [name] MRN: [id number] DOB: 10/08/1961   Brief outreach with [name] spouse [name]. Reports assisting [name] and preparing for dialysis at the time of the call. Denied urgent concerns. Agreeable to care management follow-up within the next two weeks.    Follow up plan: Anticipate follow-up within the next two weeks.   [facility] [phone]

## 2021-02-23 ENCOUNTER — Other Ambulatory Visit (INDEPENDENT_AMBULATORY_CARE_PROVIDER_SITE_OTHER): Payer: Self-pay | Admitting: Nurse Practitioner

## 2021-02-24 ENCOUNTER — Other Ambulatory Visit: Payer: Self-pay

## 2021-02-24 ENCOUNTER — Encounter
Admission: RE | Disposition: A | Discharge status: Home / Self Care | Payer: Self-pay | Source: Home / Self Care | Attending: Vascular Surgery

## 2021-02-24 ENCOUNTER — Ambulatory Visit
Admission: RE | Admit: 2021-02-24 | Discharge: 2021-02-24 | Disposition: A | Discharge status: Home / Self Care | Payer: Medicare Other | Attending: Vascular Surgery | Admitting: Vascular Surgery

## 2021-02-24 ENCOUNTER — Encounter: Payer: Self-pay | Admitting: Vascular Surgery

## 2021-02-24 DIAGNOSIS — Y832 Surgical operation with anastomosis, bypass or graft as the cause of abnormal reaction of the patient, or of later complication, without mention of misadventure at the time of the procedure: Secondary | ICD-10-CM | POA: Diagnosis not present

## 2021-02-24 DIAGNOSIS — N186 End stage renal disease: Secondary | ICD-10-CM

## 2021-02-24 DIAGNOSIS — Z89012 Acquired absence of left thumb: Secondary | ICD-10-CM | POA: Insufficient documentation

## 2021-02-24 DIAGNOSIS — Z89511 Acquired absence of right leg below knee: Secondary | ICD-10-CM | POA: Diagnosis not present

## 2021-02-24 DIAGNOSIS — Z881 Allergy status to other antibiotic agents status: Secondary | ICD-10-CM | POA: Insufficient documentation

## 2021-02-24 DIAGNOSIS — I120 Hypertensive chronic kidney disease with stage 5 chronic kidney disease or end stage renal disease: Secondary | ICD-10-CM | POA: Diagnosis not present

## 2021-02-24 DIAGNOSIS — E785 Hyperlipidemia, unspecified: Secondary | ICD-10-CM | POA: Insufficient documentation

## 2021-02-24 DIAGNOSIS — Z888 Allergy status to other drugs, medicaments and biological substances status: Secondary | ICD-10-CM | POA: Insufficient documentation

## 2021-02-24 DIAGNOSIS — Z992 Dependence on renal dialysis: Secondary | ICD-10-CM

## 2021-02-24 DIAGNOSIS — T82898A Other specified complication of vascular prosthetic devices, implants and grafts, initial encounter: Secondary | ICD-10-CM

## 2021-02-24 DIAGNOSIS — Z8619 Personal history of other infectious and parasitic diseases: Secondary | ICD-10-CM | POA: Diagnosis not present

## 2021-02-24 DIAGNOSIS — Z79899 Other long term (current) drug therapy: Secondary | ICD-10-CM | POA: Diagnosis not present

## 2021-02-24 DIAGNOSIS — Z8673 Personal history of transient ischemic attack (TIA), and cerebral infarction without residual deficits: Secondary | ICD-10-CM | POA: Insufficient documentation

## 2021-02-24 DIAGNOSIS — Z86718 Personal history of other venous thrombosis and embolism: Secondary | ICD-10-CM | POA: Insufficient documentation

## 2021-02-24 DIAGNOSIS — E1122 Type 2 diabetes mellitus with diabetic chronic kidney disease: Secondary | ICD-10-CM | POA: Diagnosis not present

## 2021-02-24 DIAGNOSIS — Z87891 Personal history of nicotine dependence: Secondary | ICD-10-CM | POA: Insufficient documentation

## 2021-02-24 DIAGNOSIS — T82858A Stenosis of vascular prosthetic devices, implants and grafts, initial encounter: Secondary | ICD-10-CM | POA: Insufficient documentation

## 2021-02-24 DIAGNOSIS — Z8249 Family history of ischemic heart disease and other diseases of the circulatory system: Secondary | ICD-10-CM | POA: Diagnosis not present

## 2021-02-24 DIAGNOSIS — Z89022 Acquired absence of left finger(s): Secondary | ICD-10-CM | POA: Insufficient documentation

## 2021-02-24 DIAGNOSIS — Z833 Family history of diabetes mellitus: Secondary | ICD-10-CM | POA: Diagnosis not present

## 2021-02-24 HISTORY — PX: A/V FISTULAGRAM: CATH118298

## 2021-02-24 LAB — GLUCOSE, CAPILLARY: Glucose-Capillary: 144 mg/dL — ABNORMAL HIGH (ref 70–99)

## 2021-02-24 LAB — POTASSIUM (ARMC VASCULAR LAB ONLY): Potassium (ARMC vascular lab): 3.3 — ABNORMAL LOW (ref 3.5–5.1)

## 2021-02-24 SURGERY — A/V FISTULAGRAM
Anesthesia: Moderate Sedation | Laterality: Left

## 2021-02-24 MED ORDER — METHYLPREDNISOLONE SODIUM SUCC 125 MG IJ SOLR: 125.0000 mg | Freq: Once | INTRAMUSCULAR | Status: DC | PRN

## 2021-02-24 MED ORDER — MIDAZOLAM HCL 2 MG/2ML IJ SOLN
INTRAMUSCULAR | Status: DC | PRN
  Administered 2021-02-24: 1 mg via INTRAVENOUS

## 2021-02-24 MED ORDER — CLINDAMYCIN PHOSPHATE 300 MG/50ML IV SOLN
INTRAVENOUS | Status: AC
  Filled 2021-02-24: qty 50

## 2021-02-24 MED ORDER — IODIXANOL 320 MG/ML IV SOLN
INTRAVENOUS | Status: DC | PRN
Start: 2021-02-24 — End: 2021-02-24
  Administered 2021-02-24: 25 mL

## 2021-02-24 MED ORDER — FAMOTIDINE 20 MG PO TABS: 40.0000 mg | ORAL_TABLET | Freq: Once | ORAL | Status: DC | PRN

## 2021-02-24 MED ORDER — HEPARIN SODIUM (PORCINE) 1000 UNIT/ML IJ SOLN
INTRAMUSCULAR | Status: AC
  Filled 2021-02-24: qty 1

## 2021-02-24 MED ORDER — CLINDAMYCIN PHOSPHATE 300 MG/50ML IV SOLN: 300.0000 mg | Freq: Once | INTRAVENOUS | Status: DC

## 2021-02-24 MED ORDER — HYDROMORPHONE HCL 1 MG/ML IJ SOLN: 1.0000 mg | Freq: Once | INTRAMUSCULAR | Status: DC | PRN

## 2021-02-24 MED ORDER — FENTANYL CITRATE (PF) 100 MCG/2ML IJ SOLN
INTRAMUSCULAR | Status: DC | PRN
  Administered 2021-02-24: 50 ug via INTRAVENOUS

## 2021-02-24 MED ORDER — MIDAZOLAM HCL 2 MG/2ML IJ SOLN
INTRAMUSCULAR | Status: AC
  Filled 2021-02-24: qty 2

## 2021-02-24 MED ORDER — ONDANSETRON HCL 4 MG/2ML IJ SOLN: 4.0000 mg | Freq: Four times a day (QID) | INTRAMUSCULAR | Status: DC | PRN

## 2021-02-24 MED ORDER — SODIUM CHLORIDE 0.9 % IV SOLN: INTRAVENOUS | Status: DC

## 2021-02-24 MED ORDER — MIDAZOLAM HCL 2 MG/ML PO SYRP: 8.0000 mg | ORAL_SOLUTION | Freq: Once | ORAL | Status: DC | PRN

## 2021-02-24 MED ORDER — FENTANYL CITRATE (PF) 100 MCG/2ML IJ SOLN
INTRAMUSCULAR | Status: AC
  Filled 2021-02-24: qty 2

## 2021-02-24 MED ORDER — DIPHENHYDRAMINE HCL 50 MG/ML IJ SOLN: 50.0000 mg | Freq: Once | INTRAMUSCULAR | Status: DC | PRN

## 2021-02-24 MED ORDER — HEPARIN SODIUM (PORCINE) 1000 UNIT/ML IJ SOLN
INTRAMUSCULAR | Status: DC | PRN
  Administered 2021-02-24: 4000 [IU] via INTRAVENOUS

## 2021-02-24 SURGICAL SUPPLY — 18 items
BALLN ATG 12X4X80 (BALLOONS) ×2
BALLN ATLAS 14X40X75 (BALLOONS) ×2
BALLN DORADO 10X40X80 (BALLOONS) ×2
BALLOON ATG 12X4X80 (BALLOONS) ×1 IMPLANT
BALLOON ATLAS 14X40X75 (BALLOONS) ×1 IMPLANT
BALLOON DORADO 10X40X80 (BALLOONS) ×1 IMPLANT
CATH KUMPE SOFT-VU 5FR 65 (CATHETERS) ×2 IMPLANT
COVER PROBE U/S 5X48 (MISCELLANEOUS) ×2 IMPLANT
DRAPE BRACHIAL (DRAPES) ×2 IMPLANT
GLIDEWIRE ADV .035X260CM (WIRE) ×2 IMPLANT
KIT ENCORE 26 ADVANTAGE (KITS) ×2 IMPLANT
NEEDLE ENTRY 21GA 7CM ECHOTIP (NEEDLE) ×2 IMPLANT
PACK ANGIOGRAPHY (CUSTOM PROCEDURE TRAY) ×2 IMPLANT
SHEATH BRITE TIP 6FRX5.5 (SHEATH) ×2 IMPLANT
SHEATH BRITE TIP 7FRX5.5 (SHEATH) ×2 IMPLANT
SHEATH BRITE TIP 8FR 5.5 (SHEATH) ×2 IMPLANT
STENT VENOVO 18X60X80 (Permanent Stent) ×2 IMPLANT
SUT MNCRL AB 4-0 PS2 18 (SUTURE) ×2 IMPLANT

## 2021-02-24 NOTE — Interval H&P Note (Signed)
History and Physical Interval Note:  02/24/2021 2:30 PM  [name]  has presented today for surgery, with the diagnosis of LT arm fistulagram   End Stage Renal.  The various methods of treatment have been discussed with the patient and family. After consideration of risks, benefits and other options for treatment, the patient has consented to  Procedure(s): A/V FISTULAGRAM (Left) as a surgical intervention.  The patient's history has been reviewed, patient examined, no change in status, stable for surgery.  I have reviewed the patient's chart and labs.  Questions were answered to the patient's satisfaction.     Hortencia Pilar

## 2021-02-24 NOTE — H&P (Signed)
@LOGO @   MRN : [id number]  [name] is a 60 y.o. (01/23/1961) male who presents with chief complaint of dialysis access is not working.  History of Present Illness:   The patient returns to the office for follow up regarding problem with the dialysis access. Currently the patient is maintained via a left upper extremity AV graft.    The dialysis center notes a significant increase in bleeding time after decannulation.  The dialysis center has informed us that there is increased recirculation.      No significant arm swelling.  No history of redness or swelling at the access site. The no history of fever or chills at home or while on dialysis.  The patient denies amaurosis fugax or recent TIA symptoms. There are no recent neurological changes noted. The patient denies claudication symptoms or rest pain symptoms. The patient denies history of DVT, PE or superficial thrombophlebitis. The patient denies recent episodes of angina or shortness of breath.      Current Meds  Medication Sig  . amLODipine (NORVASC) 10 MG tablet TAKE 1 TABLET(10 MG) BY MOUTH DAILY AS NEEDED (Patient taking differently: Take 10 mg by mouth at bedtime.)  . atorvastatin (LIPITOR) 80 MG tablet TAKE 1 TABLET(80 MG) BY MOUTH DAILY (Patient taking differently: Take 80 mg by mouth daily.)  . AURYXIA 1 GM 210 MG(Fe) tablet Take 420 mg by mouth in the morning and at bedtime.   . cloNIDine (CATAPRES) 0.1 MG tablet Take 1 tablet (0.1 mg total) by mouth 2 (two) times daily.  . cyanocobalamin (,VITAMIN B-12,) 1000 MCG/ML injection Inject 53m once a week for 4 weeks and then once a month for 3 months  . cyproheptadine (PERIACTIN) 4 MG tablet Take 1 tablet (4 mg total) by mouth 2 (two) times daily. Before meals  . furosemide (LASIX) 80 MG tablet TAKE 1 TABLET(80 MG) BY MOUTH TWICE DAILY (Patient taking differently: Take 80 mg by mouth 2 (two) times daily.)  . gabapentin (NEURONTIN) 100 MG capsule Take 100 mg by mouth at  bedtime. Take along with 3083mcapsule at bedtime  . gabapentin (NEURONTIN) 300 MG capsule Take 1 capsule (300 mg total) by mouth at bedtime.  . hydrALAZINE (APRESOLINE) 50 MG tablet Take 1 tablet (50 mg total) by mouth every 8 (eight) hours.  . levETIRAcetam (KEPPRA) 500 MG tablet Take 1 tablet (500 mg total) by mouth daily AND 1 tablet (500 mg total) every Monday, Wednesday, and Friday. After dialysis.  . Marland Kitchenisinopril (ZESTRIL) 40 MG tablet Take 40 mg by mouth daily.  . sevelamer carbonate (RENVELA) 800 MG tablet TAKE 1 TABLET(800 MG) BY MOUTH THREE TIMES DAILY (Patient taking differently: Take 800 mg by mouth 3 (three) times daily with meals.)  . spironolactone (ALDACTONE) 25 MG tablet Take 1 tablet by mouth at bedtime.    Past Medical History:  Diagnosis Date  . Acute metabolic encephalopathy 2/11/25/4130. Anemia   . Crohn disease ([facility] [place]  . Diabetes mellitus without complication ([facility]  . DVT of lower extremity (deep venous thrombosis) ([facility]2016  . Empyema (HCS[place]8/12/2016  . Encephalopathy 12/04/2017  . Fall at home, initial encounter 09/12/2020  . Hidradenitis suppurativa   . Hypertension   . ICH (intracerebral hemorrhage) (HC[facility]  . Peritonitis (HCR[place] Mar7/02/2017  . Pyogenic arthritis of knee (H[facility]4/19/2017  . Renal disorder   . Sepsis (HCOt[place]3/28/2019  . Stroke ([facility]    Past Surgical History:  Procedure Laterality Date  .  ABDOMINAL SURGERY    . AMPUTATION FINGER Left 06/2019   [facility]  . ANGIOPLASTY Left    left fem-pop at [facility] 04-11-2018  . BELOW KNEE LEG AMPUTATION Right 08/2017   [facility]  . COLONOSCOPY    . COLONOSCOPY WITH PROPOFOL N/A 10/28/2020   Procedure: COLONOSCOPY WITH PROPOFOL;  Surgeon: Lin Landsman, MD;  Location[facility] END[facility]COPY;  Service: Gastroenterology;  Laterality: N/A;  . COLONOSCOPY WITH PROPOFOL N/A 11/21/2020   Procedure: COLONOSCOPY WITH PROPOFOL;  Surgeon: [name], MD;  Location[facility]COPY;  Service: Endoscopy;   Laterality: N/A;  . DIALYSIS/PERMA CATHETER INSERTION N/A 12/09/2017   Procedure: DIALYSIS/PERMA CATHETER INSERTION;  Surgeon: Katha Cabal, MD;  Location: [place] CV LAB;  Service: Cardiovascular;  Laterality: N/A;  . DIALYSIS/PERMA CATHETER INSERTION N/A 12/12/2017   Procedure: DIALYSIS/PERMA CATHETER INSERTION;  Surgeon: Algernon Huxley, MD;  Location: [place] CV LAB;  Service: Cardiovascular;  Laterality: N/A;  . DIALYSIS/PERMA CATHETER REMOVAL Left 12/09/2017   Procedure: DIALYSIS/PERMA CATHETER REMOVAL;  Surgeon: Katha Cabal, MD;  Location: [place] CV LAB;  Service: Cardiovascular;  Laterality: Left;  . ESOPHAGOGASTRODUODENOSCOPY (EGD) WITH PROPOFOL N/A 11/20/2020   Procedure: ESOPHAGOGASTRODUODENOSCOPY (EGD) WITH PROPOFOL;  Surgeon: [name], MD;  Location: [facility] ENDOSCOPY;  Service: Endoscopy;  Laterality: N/A;  . KNEE SURGERY Left 02/04/2016   [facility]  . LEG AMPUTATION THROUGH LOWER TIBIA AND FIBULA Left 06/22/2018   [facility]  . LOWER EXTREMITY ANGIOGRAPHY Right 08/08/2017   Procedure: Lower Extremity Angiography;  Surgeon: Algernon Huxley, MD;  Location: [place] CV LAB;  Service: Cardiovascular;  Laterality: Right;  . LOWER EXTREMITY ANGIOGRAPHY Right 08/22/2017   Procedure: Lower Extremity Angiography;  Surgeon: Algernon Huxley, MD;  Location: [place] CV LAB;  Service: Cardiovascular;  Laterality: Right;  . LOWER EXTREMITY INTERVENTION  08/08/2017   Procedure: LOWER EXTREMITY INTERVENTION;  Surgeon: Algernon Huxley, MD;  Location: [place] CV LAB;  Service: Cardiovascular;;  . LOWER EXTREMITY INTERVENTION  08/22/2017   Procedure: LOWER EXTREMITY INTERVENTION;  Surgeon: Algernon Huxley, MD;  Location: [place] CV LAB;  Service: Cardiovascular;;    Social History Social History   Tobacco Use  . Smoking status: Former Research scientist (life sciences)  . Smokeless tobacco: Never Used  . Tobacco comment: smokes marijuana  Vaping Use  . Vaping Use: Never used  Substance Use Topics   . Alcohol use: No  . Drug use: Yes    Types: Marijuana    Family History Family History  Problem Relation Age of Onset  . Irritable bowel syndrome Sister   . Diabetes Sister   . Heart disease Mother   . Diabetes Mother   . Heart disease Father   . Rheumatic fever Father        as child  . Psoriasis Brother   . Arthritis Brother   . Diabetes Sister   . Diabetes Sister     Allergies  Allergen Reactions  . Methotrexate Other (See Comments)    Blood count drops  . Vancomycin Shortness Of Breath    Eyes watering, SOB, wheezing  . Cefepime Other (See Comments)  . Tape      REVIEW OF SYSTEMS (Negative unless checked)  Constitutional: [] Weight loss  [] Fever  [] Chills Cardiac: [] Chest pain   [] Chest pressure   [] Palpitations   [] Shortness of breath when laying flat   [] Shortness of breath with exertion. Vascular:  [] Pain in legs with walking   [] Pain in legs at rest  []   History of DVT   [] Phlebitis   [] Swelling in legs   [] Varicose veins   [] Non-healing ulcers Pulmonary:   [] Uses home oxygen   [] Productive cough   [] Hemoptysis   [] Wheeze  [] COPD   [] Asthma Neurologic:  [] Dizziness   [] Seizures   [x] History of stroke   [] History of TIA  [] Aphasia   [] Vissual changes   [] Weakness or numbness in arm   [x] Weakness or numbness in leg Musculoskeletal:   [] Joint swelling   [] Joint pain   [] Low back pain Hematologic:  [] Easy bruising  [] Easy bleeding   [] Hypercoagulable state   [] Anemic Gastrointestinal:  [] Diarrhea   [] Vomiting  [] Gastroesophageal reflux/heartburn   [] Difficulty swallowing. Genitourinary:  [x] Chronic kidney disease   [] Difficult urination  [] Frequent urination   [] Blood in urine Skin:  [] Rashes   [] Ulcers  Psychological:  [] History of anxiety   []  History of major depression.  Physical Examination  Vitals:   02/24/21 1358  BP: (!) 166/71  Pulse: 61  Resp: 14  SpO2: 100%  Weight: 65.8 kg  Height: 5' 11"  (1.803 m)   Body mass index is 20.22 kg/m. Gen: WD/WN,  NAD Head: [place]/AT, No temporalis wasting.  Ear/Nose/Throat: Hearing grossly intact, nares w/o erythema or drainage Eyes: PER, EOMI, sclera nonicteric.  Neck: Supple, no large masses.   Pulmonary:  Good air movement, no audible wheezing bilaterally, no use of accessory muscles.  Cardiac: RRR, no JVD Vascular: Left arm AV graft poor thrill weak bruit Vessel Right Left  Radial Palpable Palpable  Brachial Palpable Palpable  Popliteal  not palpable   not palpable  PT  BKA  BKA  DP  BKA  BKA  Gastrointestinal: Non-distended. No guarding/no peritoneal signs.  Musculoskeletal: M/S 5/5 throughout.  No deformity or atrophy.  Neurologic: CN 2-12 intact. Symmetrical.  Speech is fluent. Motor exam as listed above. Psychiatric: Judgment intact, Mood & affect appropriate for pt's clinical situation. Dermatologic: No rashes or ulcers noted.  No changes consistent with cellulitis.  CBC Lab Results  Component Value Date   WBC 10.5 02/09/2021   HGB 9.0 (L) 02/09/2021   HCT 29.5 (L) 02/09/2021   MCV 85.3 02/09/2021   PLT 135 (L) 02/09/2021    BMET    Component Value Date/Time   NA 129 (L) 02/09/2021 0448   NA 140 05/27/2020 1006   NA 134 (L) 07/24/2014 1524   K 4.4 02/09/2021 0448   K 4.3 07/24/2014 1524   CL 92 (L) 02/09/2021 0448   CL 103 07/24/2014 1524   CO2 22 02/09/2021 0448   CO2 21 07/24/2014 1524   GLUCOSE 170 (H) 02/09/2021 0448   GLUCOSE 168 (H) 07/24/2014 1524   BUN 45 (H) 02/09/2021 0448   BUN 23 05/27/2020 1006   BUN 62 (H) 07/24/2014 1524   CREATININE 7.14 (H) 02/09/2021 0448   CREATININE 4.95 (H) 07/24/2014 1524   CALCIUM 8.2 (L) 02/09/2021 0448   CALCIUM 9.3 07/24/2014 1524   GFRNONAA 8 (L) 02/09/2021 0448   GFRNONAA 13 (L) 07/24/2014 1524   GFRAA 14 (L) 05/27/2020 1006   GFRAA 16 (L) 07/24/2014 1524   Estimated Creatinine Clearance: 10.5 mL/min (A) (by C-G formula based on SCr of 7.14 mg/dL (H)).  COAG Lab Results  Component Value Date   INR 1.2 02/07/2021    INR 1.1 02/06/2021   INR 1.1 11/18/2020    Radiology CT ABDOMEN PELVIS WO CONTRAST  Result Date: 02/06/2021 CLINICAL DATA:  Abdominal pain and fever since dialysis. EXAM: CT ABDOMEN AND  PELVIS WITHOUT CONTRAST TECHNIQUE: Multidetector CT imaging of the abdomen and pelvis was performed following the standard protocol without IV contrast. COMPARISON:  11/18/2020 FINDINGS: Lower chest: Motion artifact limits the examination. Small right pleural effusion with probable right pleural thickening and calcification. No change since prior study. Mild interstitial changes in the lung bases may indicate edema. Cardiac enlargement. Hepatobiliary: No focal liver lesions identified on noncontrast imaging. Small stones in the gallbladder. No gallbladder wall thickening or edema. Bile ducts are not dilated. Pancreas: Unremarkable. No pancreatic ductal dilatation or surrounding inflammatory changes. Spleen: Spleen is mildly enlarged.  No focal lesions identified. Adrenals/Urinary Tract: No adrenal gland nodules. Kidneys are symmetrical. No hydronephrosis or stone. Bladder is decompressed. Stomach/Bowel: Stomach, small bowel, and colon are mostly decompressed. Scattered stool in the colon. No inflammatory changes appreciated. Appendix is segmentally identified and appears normal. Vascular/Lymphatic: Extensive vascular calcification throughout the abdomen and pelvis. Aortic calcification. No aortic aneurysm. Reproductive: Prostate gland is diffusely enlarged. Other: No free air or free fluid in the abdomen. Subcutaneous soft tissue gas in the upper abdomen likely represents injection sites. Musculoskeletal: Diffuse bone sclerosis likely representing renal osteodystrophy. Degenerative changes in the lumbar spine. IMPRESSION: 1. No acute process demonstrated in the abdomen or pelvis. No evidence of bowel obstruction or inflammation. 2. Cholelithiasis without evidence of cholecystitis. 3. Extensive vascular calcification throughout  the abdomen and pelvis. 4. Enlarged prostate gland. 5. Small right pleural effusion with probable right pleural thickening and calcification. No change since prior study. 6. Diffuse bone sclerosis likely representing renal osteodystrophy. Aortic Atherosclerosis (ICD10-I70.0). Electronically Signed   By: Lucienne Capers M.D.   On: 02/06/2021 21:23   CT Head Wo Contrast  Result Date: 02/06/2021 CLINICAL DATA:  Delirium and confusion. Possible seizure today. Dialysis today. EXAM: CT HEAD WITHOUT CONTRAST TECHNIQUE: Contiguous axial images were obtained from the base of the skull through the vertex without intravenous contrast. COMPARISON:  CT 11/18/2020.  MRI 11/21/2020 FINDINGS: Brain: Diffuse cerebral atrophy. Mild ventricular dilatation consistent with central atrophy. Low-attenuation changes in the deep white matter consistent small vessel ischemia. Old area of encephalomalacia in the right frontal lobe with associated calcifications, likely old infarct. Asymmetric low-attenuation in the left temporal lobe limited visualization of this area due to streak artifact but this appears to correspond to a cystic lesion with some surrounding white matter edema on the previous MRI. This could represent old infarct or a cystic mass lesion. Suggest follow-up MRI with contrast for further evaluation. No acute intracranial hemorrhage. Vascular: No hyperdense vessel or unexpected calcification. Skull: Calvarium appears intact. Sinuses/Orbits: Paranasal sinuses and mastoid air cells are clear. Other: Hyperdense soft tissue swelling over the posterior neck, likely soft tissue subcutaneous hematoma. IMPRESSION: 1. No acute intracranial abnormalities. 2. Chronic atrophy and small vessel ischemic changes. 3. Old area of encephalomalacia in the right frontal lobe with associated calcifications, likely old infarct. 4. Asymmetric low-attenuation in the left temporal lobe appears to correspond to a cystic lesion with some  surrounding white matter edema on the previous MRI. This could represent old infarct or a cystic mass lesion. Suggest follow-up MRI with contrast for further evaluation. 5. Hyperdense soft tissue swelling over the posterior neck, likely soft tissue subcutaneous hematoma. Electronically Signed   By: Lucienne Capers M.D.   On: 02/06/2021 19:59   DG Chest Port 1 View  Result Date: 02/06/2021 CLINICAL DATA:  Possible sepsis EXAM: PORTABLE CHEST 1 VIEW COMPARISON:  11/18/2020 FINDINGS: Stable cardiomegaly. Atherosclerotic calcification of the aortic knob. Chronic coarsened interstitial  markings. No vascular congestion or overt edema. No focal consolidation. Possible trace right pleural effusion. No pneumothorax. Chronic healed left-sided rib fractures. Unchanged vascular stents. IMPRESSION: Possible trace right pleural effusion. No focal consolidation. Electronically Signed   By: Davina Poke D.O.   On: 02/06/2021 18:37     Assessment/Plan 1.  End-stage renal disease on hemodialysis with poor graft function: Recommend:  The patient is experiencing increasing problems with their dialysis access.  Patient should have a left arm fistulagram with the intention for intervention.  The intention for intervention is to restore appropriate flow and prevent thrombosis and possible loss of the access.  As well as improve the quality of dialysis therapy.  The risks, benefits and alternative therapies were reviewed in detail with the patient.  All questions were answered.  The patient agrees to proceed with angio/intervention..  2.  Diabetes mellitus: Continue hypoglycemic medications as already ordered, these medications have been reviewed and there are no changes at this time.  Hgb A1C to be monitored as already arranged by primary service.  3.  Hypertension: Continue antihypertensive medications as already ordered, these medications have been reviewed and there are no changes at this time.  4.   Hyperlipidemia: Continue statin as ordered and reviewed, no changes at this time       Hortencia Pilar, MD  02/24/2021 2:17 PM

## 2021-02-24 NOTE — Op Note (Signed)
OPERATIVE NOTE   PROCEDURE: 1. Contrast injection left upper arm fistula AV access 2. Percutaneous transluminal angioplasty and stent placement left innominate vein  PRE-OPERATIVE DIAGNOSIS: Complication of dialysis access                                                       End Stage Renal Disease  POST-OPERATIVE DIAGNOSIS: same as above   SURGEON: Katha Cabal, M.D.  ANESTHESIA: Conscious sedation was administered under my direct supervision by the interventional radiology RN. IV Versed plus fentanyl were utilized. Continuous ECG, pulse oximetry and blood pressure was monitored throughout the entire procedure.  Conscious sedation was for a total of 37 minutes.  ESTIMATED BLOOD LOSS: minimal  FINDING(S): 1. Greater than 70% central venous stenosis within a previously stented segment  SPECIMEN(S):  None  CONTRAST: 25 cc  FLUOROSCOPY TIME: 5.2 minutes  INDICATIONS: [name] is a 60 y.o. male who  presents with malfunctioning left arm AV access.  The patient is scheduled for angiography with possible intervention of the AV access.  The patient is aware the risks include but are not limited to: bleeding, infection, thrombosis of the cannulated access, and possible anaphylactic reaction to the contrast.  The patient acknowledges if the access can not be salvaged a tunneled catheter will be needed and will be placed during this procedure.  The patient is aware of the risks of the procedure and elects to proceed with the angiogram and intervention.  DESCRIPTION: After full informed written consent was obtained, the patient was brought back to the Special Procedure suite and placed supine position.  Appropriate cardiopulmonary monitors were placed.  The left arm was prepped and draped in the standard fashion.  Appropriate timeout is called.   The left brachiocephalic fistula was cannulated with a micropuncture needle.  Cannulation was performed with ultrasound guidance.  Ultrasound was placed in a sterile sleeve, the AV access was interrogated and noted to be echolucent and compressible indicating patency. Image was recorded for the permanent record. The puncture is performed under continuous ultrasound visualization.   The microwire was advanced and the needle was exchanged for  a microsheath.  The J-wire was then advanced and a 6 Fr sheath inserted.  Hand injections were completed to image the access from the arterial anastomosis through the entire access.  The central venous structures were also imaged by hand injections.  Diagnostic interpretation: The fistula itself in the peripheral segment is patent there are 2 moderate size aneurysms that are free of thrombus at the locations he is being cannulated.  There is a stent at the cephalic confluence with the subclavian which is widely patent.  There are no hemodynamically significant stenoses identified in the peripheral segment.  The central venous anatomy demonstrates a greater than 70% stenosis within the innominate vein which is already been stented.  On the initial imaging the pre-existing innominate stent extends significantly into the superior vena cava, approximately 50% across the confluence of the right innominate.  Based on the images,  4000 units of heparin was given and the 6 French sheath was upsized to a 7 [place] sheath.  A wire was negotiated through the central strictures.  An 10 mm x 40 mm Dorado balloon was then used to predilate the stricture.  Inflation was to 16 atmospheres for 1 minute.  Repeat imaging demonstrated greater than 50% residual stenosis but improvement sufficient to allow for stenting.  A 18 mm x 60 mm Venovo stent was then opened onto the field and deployed across the lesion without difficulty.  It was initially post dilated with a 12 mm x 40 mm Atlas balloon with an inflation to 14 atm for 1 minute.  Secondary dilation was then performed with a 14 mm x 40 Atlas balloon inflated to 10 atm  for 1 minute.  Follow-up imaging demonstrates complete resolution of the stricture, less than 10% residual stenosis, with rapid flow of contrast through the central venous anatomy.  A 4-0 Monocryl purse-string suture was sewn around the sheath.  The sheath was removed and light pressure was applied.  A sterile bandage was applied to the puncture site.    COMPLICATIONS: None  CONDITION: Carlynn Purl, M.D [facility] Office: [phone]  02/24/2021 3:11 PM

## 2021-02-25 ENCOUNTER — Encounter: Payer: Self-pay | Admitting: Vascular Surgery

## 2021-03-02 ENCOUNTER — Other Ambulatory Visit: Payer: Self-pay | Admitting: Family Medicine

## 2021-03-02 NOTE — Telephone Encounter (Signed)
Future visit in 2 months  

## 2021-03-04 ENCOUNTER — Other Ambulatory Visit: Payer: Self-pay | Admitting: Family Medicine

## 2021-03-04 NOTE — Telephone Encounter (Signed)
Medication Refill - Medication:   levETIRAcetam (KEPPRA) 500 MG tablet   Has the patient contacted their pharmacy? Yes.  previously prescribed by a different provider, pt wanted to know if Dr Caryn Section would provide a refill.   Preferred Pharmacy (with phone number or street name):   [facility] [place], [address]  [place], [place] [address]  Phone:  [phone] Fax:  [phone]   Agent: Please be advised that RX refills may take up to 3 business days. We ask that you follow-up with your pharmacy.

## 2021-03-04 NOTE — Telephone Encounter (Signed)
  Notes to clinic: Patient would like to know if Dr. Caryn Section can fill this medication Please advise   Requested Prescriptions  Pending Prescriptions Disp Refills   levETIRAcetam (KEPPRA) 500 MG tablet 110 tablet 1    Sig: Take 1 tablet (500 mg total) by mouth daily AND 1 tablet (500 mg total) every Monday, Wednesday, and Friday. After dialysis.      Not Delegated - Neurology:  Anticonvulsants Failed - 03/04/2021  3:11 PM      Failed - This refill cannot be delegated      Failed - HCT in normal range and within 360 days    HCT  Date Value Ref Range Status  02/09/2021 29.5 (L) 39.0 - 52.0 % Final   Hematocrit  Date Value Ref Range Status  12/02/2020 24.9 (L) 37.5 - 51.0 % Final          Failed - HGB in normal range and within 360 days    Hemoglobin  Date Value Ref Range Status  02/09/2021 9.0 (L) 13.0 - 17.0 g/dL Final  12/02/2020 8.0 (L) 13.0 - 17.7 g/dL Final          Failed - PLT in normal range and within 360 days    Platelets  Date Value Ref Range Status  02/09/2021 135 (L) 150 - 400 K/uL Final  12/02/2020 134 (L) 150 - 450 x10E3/uL Final          Passed - WBC in normal range and within 360 days    WBC  Date Value Ref Range Status  02/09/2021 10.5 4.0 - 10.5 K/uL Final          Passed - Valid encounter within last 12 months    Recent Outpatient Visits           2 weeks ago    [facility] [name], MD   3 months ago Anemia in ESRD (end-stage renal disease) [facility])   [place]as, Donald E, MD   6 months ago Primary hypertension   [facility] [name], MD   9 months ago Type 2 diabetes mellitus with other diabetic kidney complication, with long-term current use of insulin [facility])   [facility] [name], MD   10 months ago Type 2 diabetes mellitus with other diabetic kidney complication, with long-term current use of insulin [facility])   [facility] [name], MD       Future Appointments             In 2 months [name], [name], MD [facility], [place]

## 2021-03-05 ENCOUNTER — Other Ambulatory Visit: Payer: Self-pay

## 2021-03-05 ENCOUNTER — Other Ambulatory Visit: Payer: Medicare Other | Admitting: Nurse Practitioner

## 2021-03-05 ENCOUNTER — Encounter: Payer: Self-pay | Admitting: Nurse Practitioner

## 2021-03-05 DIAGNOSIS — Z515 Encounter for palliative care: Secondary | ICD-10-CM

## 2021-03-05 DIAGNOSIS — R5381 Other malaise: Secondary | ICD-10-CM

## 2021-03-05 NOTE — Progress Notes (Signed)
[facility] Palliative Care Consult Note Telephone: [phone]  Fax: [phone]    Date of encounter: 03/05/21 PATIENT NAME: [name] [id number]   [phone] (home)  DOB: 06/15/1961 MRN: [id number] PRIMARY CARE PROVIDER:    [name], MD,  [address] [place] [address] [phone]  RESPONSIBLE PARTY:    Contact Information    Name Relation Home Work Mobile   Brady,[name] Spouse [phone]     Brady, [name] Daughter [phone]       I met face to face with patient and family in home. Palliative Care was asked to follow this patient by consultation request of  Brady, [name] Peri, MD to address advance care planning and complex medical decision making. This is the initial visit.  ASSESSMENT AND PLAN / RECOMMENDATIONS:   Advance Care Planning/Goals of Care: Goals include to maximize quality of life and symptom management. Our advance care planning conversation included a discussion about:     The value and importance of advance care planning   Experiences with loved ones who have been seriously ill or have died   Exploration of personal, cultural or spiritual beliefs that might influence medical decisions   Exploration of goals of care in the event of a sudden injury or illness   Identification and preparation of a healthcare agent   Review and updating or creation of an  advance directive document .  Decision not to resuscitate or to de-escalate disease focused treatments due to poor prognosis.  CODE STATUS: Full code  Symptom Management/Plan: 1. ACP; full code; blank MOST form left for further review and discussion. [name] wishes to continue hemodialysis, aggressive interventions  2. Debility secondary to ESRD/HD with generalized weakness; continue to encourage mobility, exercises as able.   3. Goals of Care: Goals include to maximize quality of life and symptom  management. Our advance care planning conversation included a discussion about:     The value and importance of advance care planning   Exploration of personal, cultural or spiritual beliefs that might influence medical decisions   Exploration of goals of care in the event of a sudden injury or illness   Identification and preparation of a healthcare agent   Review and updating or creation of an advance directive document.  4. Palliative care encounter; Palliative care encounter; Palliative medicine team will continue to support patient, patient's family, and medical team. Visit consisted of counseling and education dealing with the complex and emotionally intense issues of symptom management and palliative care in the setting of serious and potentially life-threatening illness  Follow up Palliative Care Visit: Palliative care will continue to follow for complex medical decision making, advance care planning, and clarification of goals. Return 4 weeks or prn.  I spent 60 minutes providing this consultation. More than 50% of the time in this consultation was spent in counseling and care coordination.  PPS: 50%  HOSPICE ELIGIBILITY/DIAGNOSIS: TBD  Chief Complaint: Initial palliative consult for complex medical decision making.  HISTORY OF PRESENT ILLNESS:  [name] is a 60 y.o. year old male  with  End stage renal disease currently on hemodialysis, Stroke, intracerebral hemorrhage, diabetes, anemia, Crohn's disease, hypertension, hidradenitis supportive, DVT, AV fistula gram, left amputation finger, bilateral below knee amputation, dialysis permacath. 4/ 22/2022 to 4/26/ 2022 four acute metabolic encephalopathy with vascular dementia, breakthrough seizure, sepsis secondary to Cellulitis right buttocks, electrolyte imbalance, and sage renal disease on hemodialysis Monday, Wednesday,  Friday. Same day admission 02/24/2021 for dialysis access not working followed by [name]. Current weight  143 with BMI 20.06. I called [name] to confirm PC initial visit and covid screening which was negative. I visited [name] and [name] in their home. We talked about purpose of PC, what is provided. We talked about life review, past medical history in the setting of chronic disease progression of ESRD requiring HD with late onset cva, bilateral amputee with prosthetics. We talked about functional level, ambulating with rolaid walker previously until broke. [name]. [name] does not like to use the regular walker. Now [name]. [name] usually stays in the w/c. [name]. [name] does ride transportation by himself M/W/F to dialysis. We talked about [name]. [name] able to bath with assistance, dress, feed himself. We talked about declined appetite. We talked at length about weight loss. We talked about possible swallowing evaluation, the larger pills are more difficult to get down. [name]. [name] does not appear to be choking or coughing during eating or drinking. We talked about challenges with loss of independence secondary to ESRD/HD with late onset CVA. We talked about how [name]. [name] feels about what has happened in his life. We talked about quality of life what that means to him. We talked about suffering. We talked about medical goals of care including aggressive, conservative, comfort care. We talked about CPR. [name] wishes are for aggressive interventions including CPR, full code with continue hemodialysis. [name] would want IVF, antibiotics but does not think he would want a feeding tube. We talked about MOST form. Blank MOST form left for further review, discussion. We talked about role PC in poc. [name] talked about the difficulties that he faces and the things that make his life good like his family. We talked about f/u PC visit in 4 weeks if needed or sooner if declines. [name] and [name] in agreement, appointment scheduled. Questions answered. Therapeutic listening, emotional support provided.   History obtained  from review of EMR, discussion with  interview with family, wife and [name].  I reviewed available labs, medications, imaging, studies and related documents from the EMR.  Records reviewed and summarized above.   ROS Full 14 system review of systems performed and negative with exception of: as per HPI.  Physical Exam: Constitutional: NAD General: frail appearing, thin, chronically ill male EYES:  lids intact ENMT: oral mucous membranes moist CV: S1S2, RRR, no LE edema Pulmonary: LCTA, no increased work of breathing, no cough, room air Abdomen: soft and non tender, GU: deferred MSK: moves all extremities, ambulatory with walker, max assist with bilateral prostestics Skin: warm and dry, no rashes or wounds on visible skin Neuro:  + generalized weakness,  mild cognitive impairment Psych: non-anxious affect, A and O x 3  CURRENT PROBLEM LIST:  Patient Active Problem List   Diagnosis Date Noted  . Cellulitis and abscess of buttock   . History of GI bleed 02/06/2021  . Long term current use of immunosuppressive drug 02/06/2021  . Disorder of skin due to Crohn's disease ([place]) 02/06/2021  . Complication of vascular access for dialysis 01/22/2021  . Polyp of transverse colon   . Acute metabolic encephalopathy 37/90/2409  . Anemia in ESRD (end-stage renal disease) ([place]) 11/18/2020  . Chronic systolic CHF (congestive heart failure) ([place]) 11/18/2020  . Cerebrovascular disease 09/12/2020  . Transient alteration of awareness 09/12/2020  . Hyperkalemia 09/12/2020  . Mild cognitive impairment 09/30/2019  . Peripheral vascular disease ([place]) 06/28/2018  .  Sepsis ([place]) 01/12/2018  . Pressure injury of skin 12/04/2017  . S/P bilateral BKA (below knee amputation) ([place]) 10/20/2017  . Atherosclerosis of native arteries of extremity with rest pain ([place]) 08/05/2017  . History of CVA (cerebrovascular accident) 04/15/2017  . Seizure disorder ([place]) 04/15/2017  . End stage renal failure on dialysis  ([place]) 04/12/2016  . Aphthae 02/20/2016  . Hidradenitis suppurativa 02/20/2016  . Leg pain 02/20/2016  . Neuropathy 02/20/2016  . Narrowing of intervertebral disc space 08/29/2015  . Vascular disorder of lower extremity 08/29/2015  . Failure of erection 08/29/2015  . Hypercholesteremia 08/29/2015  . Hypertension 08/29/2015  . Anemia due to chronic kidney disease 05/26/2015  . Venous insufficiency of leg 09/04/2014  . History of deep vein thrombosis (DVT) of lower extremity 08/16/2014  . Prostatic intraepithelial neoplasia 11/02/2013  . Elevated prostate specific antigen (PSA) 09/11/2013  . Benign prostatic hyperplasia with urinary obstruction 08/13/2013  . Spermatocele 08/13/2013  . Avitaminosis D 01/25/2013  . Type 2 diabetes mellitus with kidney complication, with long-term current use of insulin ([place]) 03/28/2012  . Crohn disease ([place]) 08/03/2011   PAST MEDICAL HISTORY:  Active Ambulatory Problems    Diagnosis Date Noted  . Narrowing of intervertebral disc space 08/29/2015  . History of deep vein thrombosis (DVT) of lower extremity 08/16/2014  . Vascular disorder of lower extremity 08/29/2015  . Failure of erection 08/29/2015  . Hypercholesteremia 08/29/2015  . Hypertension 08/29/2015  . Benign prostatic hyperplasia with urinary obstruction 08/13/2013  . Anemia due to chronic kidney disease 05/26/2015  . Aphthae 02/20/2016  . Crohn disease ([place]) 08/03/2011  . Elevated prostate specific antigen (PSA) 09/11/2013  . Hidradenitis suppurativa 02/20/2016  . Leg pain 02/20/2016  . Neuropathy 02/20/2016  . Prostatic intraepithelial neoplasia 11/02/2013  . Spermatocele 08/13/2013  . Venous insufficiency of leg 09/04/2014  . Type 2 diabetes mellitus with kidney complication, with long-term current use of insulin ([place]) 03/28/2012  . Avitaminosis D 01/25/2013  . End stage renal failure on dialysis ([place]) 04/12/2016  . History of CVA (cerebrovascular accident) 04/15/2017  . Seizure  disorder ([place]) 04/15/2017  . Atherosclerosis of native arteries of extremity with rest pain ([place]) 08/05/2017  . S/P bilateral BKA (below knee amputation) ([place]) 10/20/2017  . Pressure injury of skin 12/04/2017  . Sepsis ([place]) 01/12/2018  . Peripheral vascular disease ([place]) 06/28/2018  . Mild cognitive impairment 09/30/2019  . Cerebrovascular disease 09/12/2020  . Transient alteration of awareness 09/12/2020  . Hyperkalemia 09/12/2020  . Acute metabolic encephalopathy 16/07/9603  . Anemia in ESRD (end-stage renal disease) ([place]) 11/18/2020  . Chronic systolic CHF (congestive heart failure) ([place]) 11/18/2020  . Polyp of transverse colon   . Complication of vascular access for dialysis 01/22/2021  . History of GI bleed 02/06/2021  . Long term current use of immunosuppressive drug 02/06/2021  . Disorder of skin due to Crohn's disease ([place]) 02/06/2021  . Cellulitis and abscess of buttock    Resolved Ambulatory Problems    Diagnosis Date Noted  . Accumulation of fluid in tissues 08/29/2015  . Acute renal failure superimposed on stage 4 chronic kidney disease ([place]) 11/06/2015  . Chronic kidney disease, stage V ([place]) 01/25/2013  . Acidosis, metabolic 54/06/8118  . Chest pain 02/20/2016  . Abnormal presence of protein in urine 03/28/2012  . Pyogenic arthritis of knee ([place]) 02/04/2016  . Cellulitis of left lower extremity 04/12/2016  . Peritonitis ([name]) 04/21/2017  . Empyema ([place]) 05/20/2017  . Encephalopathy 12/04/2017  . Anemia 01/05/2019  . Fall  at home, initial encounter 09/12/2020  . B12 deficiency 10/12/2020  . Screening for colon cancer    Past Medical History:  Diagnosis Date  . Diabetes mellitus without complication ([place])   . DVT of lower extremity (deep venous thrombosis) ([place]) 2016  . ICH (intracerebral hemorrhage) ([place])   . Renal disorder   . Stroke [facility])    SOCIAL HX:  Social History   Tobacco Use  . Smoking status: Former Research scientist (life sciences)  . Smokeless tobacco: Never Used  .  Tobacco comment: smokes marijuana  Substance Use Topics  . Alcohol use: No   FAMILY HX:  Family History  Problem Relation Age of Onset  . Irritable bowel syndrome Sister   . Diabetes Sister   . Heart disease Mother   . Diabetes Mother   . Heart disease Father   . Rheumatic fever Father        as child  . Psoriasis Brother   . Arthritis Brother   . Diabetes Sister   . Diabetes Sister   Reviewed  ALLERGIES:  Allergies  Allergen Reactions  . Methotrexate Other (See Comments)    Blood count drops  . Vancomycin Shortness Of Breath    Eyes watering, SOB, wheezing  . Cefepime Other (See Comments)  . Tape      PERTINENT MEDICATIONS:  Outpatient Encounter Medications as of 03/05/2021  Medication Sig  . acetaminophen (TYLENOL) 500 MG tablet Take 1,000 mg by mouth daily as needed for moderate pain or headache.   . Adalimumab ([name] PEN) 40 MG/0.4ML PNKT Inject 40 mg into the muscle once a week.   . Alcohol Swabs PADS Use as directed to check blood sugar three times daily for insulin dependent type 2 diabetes.  Marland Kitchen amLODipine (NORVASC) 10 MG tablet TAKE 1 TABLET(10 MG) BY MOUTH DAILY AS NEEDED  . atorvastatin (LIPITOR) 80 MG tablet TAKE 1 TABLET(80 MG) BY MOUTH DAILY (Patient taking differently: Take 80 mg by mouth daily.)  . AURYXIA 1 GM 210 MG(Fe) tablet Take 420 mg by mouth in the morning and at bedtime.   . Blood Glucose Monitoring Suppl (ONE TOUCH ULTRA 2) w/Device KIT Use as directed to check blood sugar three times daily. E11.9  . carvedilol (COREG) 25 MG tablet Take 1 tablet (25 mg total) by mouth 2 (two) times daily.  . cloNIDine (CATAPRES) 0.1 MG tablet Take 1 tablet (0.1 mg total) by mouth 2 (two) times daily.  . cyanocobalamin (,VITAMIN B-12,) 1000 MCG/ML injection Inject 32m once a week for 4 weeks and then once a month for 3 months  . cyproheptadine (PERIACTIN) 4 MG tablet Take 1 tablet (4 mg total) by mouth 2 (two) times daily. Before meals  . furosemide (LASIX) 80 MG  tablet TAKE 1 TABLET(80 MG) BY MOUTH TWICE DAILY (Patient taking differently: Take 80 mg by mouth 2 (two) times daily.)  . gabapentin (NEURONTIN) 100 MG capsule Take 100 mg by mouth at bedtime. Take along with 3028mcapsule at bedtime  . gabapentin (NEURONTIN) 300 MG capsule Take 1 capsule (300 mg total) by mouth at bedtime.  . hydrALAZINE (APRESOLINE) 50 MG tablet Take 1 tablet (50 mg total) by mouth every 8 (eight) hours.  . levETIRAcetam (KEPPRA) 500 MG tablet Take 1 tablet (500 mg total) by mouth daily AND 1 tablet (500 mg total) every Monday, Wednesday, and Friday. After dialysis.  . Marland Kitchenisinopril (ZESTRIL) 40 MG tablet Take 40 mg by mouth daily.  . sevelamer carbonate (RENVELA) 800 MG tablet TAKE 1 TABLET(800 MG)  BY MOUTH THREE TIMES DAILY (Patient taking differently: Take 800 mg by mouth 3 (three) times daily with meals.)  . spironolactone (ALDACTONE) 25 MG tablet Take 1 tablet by mouth at bedtime.   No facility-administered encounter medications on file as of 03/05/2021.   Questions and concerns were addressed. The patient/family was encouraged to call with questions and/or concerns. My business card was provided. Provided general support and encouragement, no other unmet needs identified  Thank you for the opportunity to participate in the care of [name]. Vignola.  The palliative care team will continue to follow. Please call our office at [phone] if we can be of additional assistance.   This chart was dictated using voice recognition software. Despite best efforts to proofread, errors can occur which can change the documentation meaning.  Patryck Kilgore Z Kolton Kienle, NP , MSN, , ACHPN  COVID-19 PATIENT SCREENING TOOL Asked and negative response unless otherwise noted:   Have you had symptoms of covid, tested positive or been in contact with someone with symptoms/positive test in the past 5-10 days? NO

## 2021-03-06 ENCOUNTER — Telehealth: Payer: Self-pay

## 2021-03-06 ENCOUNTER — Telehealth: Payer: Self-pay | Admitting: Family Medicine

## 2021-03-06 NOTE — Telephone Encounter (Signed)
Home Health Verbal Orders - Caller/Agency: Betty/ [facility]  Callback Number: [phone] Requesting Speech Therapy:  Frequency: Eval / trouble swallowing

## 2021-03-06 NOTE — Telephone Encounter (Signed)
Verbal order given. Per Inez Catalina, an order will be faxed to our office for signature also.

## 2021-03-06 NOTE — Telephone Encounter (Signed)
That's fine

## 2021-03-06 NOTE — Telephone Encounter (Signed)
At the direction of Palliative NP, PCP office contacted to request order for ST eval due to difficulty swallowing large pills. Message left with nurse to relay to MD

## 2021-03-10 ENCOUNTER — Other Ambulatory Visit: Payer: Self-pay | Admitting: Family Medicine

## 2021-03-10 DIAGNOSIS — I5022 Chronic systolic (congestive) heart failure: Secondary | ICD-10-CM

## 2021-03-11 ENCOUNTER — Other Ambulatory Visit (INDEPENDENT_AMBULATORY_CARE_PROVIDER_SITE_OTHER): Payer: Self-pay | Admitting: Vascular Surgery

## 2021-03-11 DIAGNOSIS — T829XXS Unspecified complication of cardiac and vascular prosthetic device, implant and graft, sequela: Secondary | ICD-10-CM

## 2021-03-11 DIAGNOSIS — N186 End stage renal disease: Secondary | ICD-10-CM

## 2021-03-11 DIAGNOSIS — Z95820 Peripheral vascular angioplasty status with implants and grafts: Secondary | ICD-10-CM

## 2021-03-12 ENCOUNTER — Encounter (INDEPENDENT_AMBULATORY_CARE_PROVIDER_SITE_OTHER): Payer: Medicare Other

## 2021-03-12 ENCOUNTER — Ambulatory Visit (INDEPENDENT_AMBULATORY_CARE_PROVIDER_SITE_OTHER): Payer: Medicare Other | Admitting: Nurse Practitioner

## 2021-03-20 ENCOUNTER — Telehealth: Payer: Self-pay

## 2021-03-20 NOTE — Telephone Encounter (Signed)
Copied from [place] [phone]. Topic: General - Other >> Mar 20, 2021 11:00 AM Leward Quan A wrote: Reason for CRM: Patient wife [name] called in to inform dr Caryn Section that the [facility] forms filled out are incomplete. States that part C amount of leave needed is incomplete asking for this to be updated, re faxed  and to call for pick up please Ph# [phone]

## 2021-03-25 NOTE — Telephone Encounter (Signed)
Pts wife was following up on the status of the FMLA paperwork, pt stated it is urgent due to a deadline, please let the pt know when she can get the paperwork.

## 2021-03-26 ENCOUNTER — Telehealth: Payer: Self-pay

## 2021-03-26 NOTE — Telephone Encounter (Signed)
Copied from [place] [id number]. Topic: General - Other >> Mar 26, 2021 10:14 AM Yvette Rack wrote: Reason for CRM: Pt wife requests call back in regards to the [facility] paperwork. Pt wife stated she really needs Dr. Maralyn Sago nurse to call her back asap because there is a deadline and she does not want the claim to be closed out. Cb# [phone]

## 2021-03-26 NOTE — Telephone Encounter (Signed)
Spoke with pt's wife [name]. She advised that an end date was required by [employer] on part C # 3.b. The form has been placed in Dr. Maralyn Sago box with a sticky on the part of the form that needs and end date. [name] request the end date be 08/30/2021. Darden Amber

## 2021-03-26 NOTE — Telephone Encounter (Signed)
See telephone encounter 03/26/21.

## 2021-03-31 ENCOUNTER — Ambulatory Visit (INDEPENDENT_AMBULATORY_CARE_PROVIDER_SITE_OTHER): Payer: Medicare Other

## 2021-03-31 ENCOUNTER — Encounter (INDEPENDENT_AMBULATORY_CARE_PROVIDER_SITE_OTHER): Payer: Self-pay | Admitting: Nurse Practitioner

## 2021-03-31 ENCOUNTER — Other Ambulatory Visit: Payer: Self-pay

## 2021-03-31 ENCOUNTER — Ambulatory Visit (INDEPENDENT_AMBULATORY_CARE_PROVIDER_SITE_OTHER): Payer: Medicare Other | Admitting: Nurse Practitioner

## 2021-03-31 VITALS — BP 186/65 | HR 63 | Resp 16

## 2021-03-31 DIAGNOSIS — Z794 Long term (current) use of insulin: Secondary | ICD-10-CM

## 2021-03-31 DIAGNOSIS — Z95820 Peripheral vascular angioplasty status with implants and grafts: Secondary | ICD-10-CM | POA: Diagnosis not present

## 2021-03-31 DIAGNOSIS — N186 End stage renal disease: Secondary | ICD-10-CM

## 2021-03-31 DIAGNOSIS — T829XXS Unspecified complication of cardiac and vascular prosthetic device, implant and graft, sequela: Secondary | ICD-10-CM | POA: Diagnosis not present

## 2021-03-31 DIAGNOSIS — E1129 Type 2 diabetes mellitus with other diabetic kidney complication: Secondary | ICD-10-CM | POA: Diagnosis not present

## 2021-03-31 DIAGNOSIS — E7800 Pure hypercholesterolemia, unspecified: Secondary | ICD-10-CM | POA: Diagnosis not present

## 2021-04-01 ENCOUNTER — Other Ambulatory Visit: Payer: Self-pay

## 2021-04-01 MED ORDER — GABAPENTIN 300 MG PO CAPS: 300.0000 mg | ORAL_CAPSULE | Freq: Every day | ORAL | 4 refills | Status: DC

## 2021-04-01 NOTE — Telephone Encounter (Signed)
Corrected form faxed today and pt advised.

## 2021-04-01 NOTE — Telephone Encounter (Signed)
Copied from [place] [id number]. Topic: General - Other >> Mar 31, 2021  2:06 PM Pawlus, Brayton Layman A wrote: Reason for CRM: Pt was trying to refill gabapentin (NEURONTIN) 300 MG capsule, original perscription was written by a different provider but caller still wanted to know if Dr Caryn Section could refill it. Please advise if this would be possible.

## 2021-04-06 ENCOUNTER — Encounter (INDEPENDENT_AMBULATORY_CARE_PROVIDER_SITE_OTHER): Payer: Self-pay | Admitting: Nurse Practitioner

## 2021-04-06 NOTE — Progress Notes (Signed)
Subjective:    Patient ID: [name], male    DOB: 03/14/1961, 60 y.o.   MRN: [id number] Chief Complaint  Patient presents with   Follow-up    [facility] 2wk post a/v fistulagram    The patient returns to the office for followup status post intervention of the dialysis access left brachiocephalic AV fistula. Following the intervention the access function has significantly improved, with better flow rates and improved KT/V. The patient has not been experiencing increased bleeding times following decannulation and the patient denies increased recirculation. The patient denies an increase in arm swelling. At the present time the patient denies hand pain.  The patient denies amaurosis fugax or recent TIA symptoms. There are no recent neurological changes noted. The patient denies claudication symptoms or rest pain symptoms. The patient denies history of DVT, PE or superficial thrombophlebitis. The patient denies recent episodes of angina or shortness of breath.    Today the patient has a flow volume of 769.  Previous velocities were 727.  Small AV fistula still seen.  However, and he had not the bleeding that was experienced prior is much improved.      Review of Systems  Hematological:  Does not bruise/bleed easily.  All other systems reviewed and are negative.     Objective:   Physical Exam Vitals reviewed.  HENT:     Head: Normocephalic.  Cardiovascular:     Rate and Rhythm: Normal rate.     Pulses: Normal pulses.          Radial pulses are 2+ on the left side.     Arteriovenous access: Left arteriovenous access is present.    Comments: Good thrill and bruit Pulmonary:     Effort: Pulmonary effort is normal.  Neurological:     Mental Status: He is alert and oriented to person, place, and time.  Psychiatric:        Mood and Affect: Mood normal.        Behavior: Behavior normal.        Thought Content: Thought content normal.        Judgment: Judgment normal.    BP (!)  186/65 (BP Location: Right Arm)   Pulse 63   Resp 16   Past Medical History:  Diagnosis Date   Acute metabolic encephalopathy 03/19/3761   Anemia    Crohn disease ([place])    Diabetes mellitus without complication ([place])    DVT of lower extremity (deep venous thrombosis) ([place]) 2016   Empyema ([place]) 05/20/2017   Encephalopathy 12/04/2017   Fall at home, initial encounter 09/12/2020   Hidradenitis suppurativa    Hypertension    ICH (intracerebral hemorrhage) ([place])    Peritonitis ([place]) 04/21/2017   Pyogenic arthritis of knee ([place]) 02/04/2016   Renal disorder    Sepsis ([place]) 01/12/2018   Stroke [facility])     Social History   Socioeconomic History   Marital status: Married    Spouse name: Not on file   Number of children: 2   Years of education: Not on file   Highest education level: Associate degree: occupational, Hotel manager, or vocational program  Occupational History   Occupation: disable  Tobacco Use   Smoking status: Former    Pack years: 0.00   Smokeless tobacco: Never   Tobacco comments:    smokes marijuana  Vaping Use   Vaping Use: Never used  Substance and Sexual Activity   Alcohol use: No   Drug use: Yes    Types:  Marijuana   Sexual activity: Not Currently  Other Topics Concern   Not on file  Social History Narrative   Not on file   Social Determinants of Health   Financial Resource Strain: Low Risk    Difficulty of Paying Living Expenses: Not hard at all  Food Insecurity: No Food Insecurity   Worried About Running Out of Food in the Last Year: Never true   [place] in the Last Year: Never true  Transportation Needs: No Transportation Needs   Lack of Transportation (Medical): No   Lack of Transportation (Non-Medical): No  Physical Activity: Inactive   Days of Exercise per Week: 0 days   Minutes of Exercise per Session: 0 min  Stress: No Stress Concern Present   Feeling of Stress : Only a little  Social Connections: Moderately Isolated   Frequency of  Communication with Friends and Family: Twice a week   Frequency of Social Gatherings with Friends and Family: Three times a week   Attends Religious Services: Never   Active Member of Clubs or Organizations: No   Attends Archivist Meetings: Never   Marital Status: Married  Human resources officer Violence: Not At Risk   Fear of Current or Ex-Partner: No   Emotionally Abused: No   Physically Abused: No   Sexually Abused: No    Past Surgical History:  Procedure Laterality Date   A/V FISTULAGRAM Left 02/24/2021   Procedure: A/V FISTULAGRAM;  Surgeon: Katha Cabal, MD;  Location: [place] [facility];  Service: Cardiovascular;  Laterality: Left;   ABDOMINAL SURGERY     AMPUTATION FINGER Left 06/2019   PR AMPUTATION LONG FINGER/THUMB+FLAPS [facility]   ANGIOPLASTY Left    left fem-pop at [facility] 04-11-2018   BELOW KNEE LEG AMPUTATION Right 08/2017   [facility]   COLONOSCOPY     COLONOSCOPY WITH PROPOFOL N/A 10/28/2020   Procedure: COLONOSCOPY WITH PROPOFOL;  Surgeon: Lin Landsman, MD;  Location: [place];  Service: Gastroenterology;  Laterality: N/A;   COLONOSCOPY WITH PROPOFOL N/A 11/21/2020   Procedure: COLONOSCOPY WITH PROPOFOL;  Surgeon: [name], MD;  [facility] ENDOSCOPY;  Service: Endoscopy;  Laterality: N/A;   DIALYSIS/PERMA CATHETER INSERTION N/A 12/09/2017   Procedure: DIALYSIS/PERMA CATHETER INSERTION;  Surgeon: Katha Cabal, MD;  Location: [place] [facility];  Service: Cardiovascular;  Laterality: N/A;   DIALYSIS/PERMA CATHETER INSERTION N/A 12/12/2017   Procedure: DIALYSIS/PERMA CATHETER INSERTION;  Surgeon: Algernon Huxley, MD;  Location: [place] [facility];  Service: Cardiovascular;  Laterality: N/A;   DIALYSIS/PERMA CATHETER REMOVAL Left 12/09/2017   Procedure: DIALYSIS/PERMA CATHETER REMOVAL;  Surgeon: Katha Cabal, MD;  Location: [place] [facility];  Service: Cardiovascular;  Laterality: Left;   ESOPHAGOGASTRODUODENOSCOPY (EGD) WITH PROPOFOL N/A  11/20/2020   Procedure: ESOPHAGOGASTRODUODENOSCOPY (EGD) WITH PROPOFOL;  Surgeon: [name], MD;  Location: [facility] ENDOSCOPY;  Service: Endoscopy;  Laterality: N/A;   KNEE SURGERY Left 02/04/2016   [facility]   LEG AMPUTATION THROUGH LOWER TIBIA AND FIBULA Left 06/22/2018   [facility]   LOWER EXTREMITY ANGIOGRAPHY Right 08/08/2017   Procedure: Lower Extremity Angiography;  Surgeon: Algernon Huxley, MD;  Location: [place] [facility];  Service: Cardiovascular;  Laterality: Right;   LOWER EXTREMITY ANGIOGRAPHY Right 08/22/2017   Procedure: Lower Extremity Angiography;  Surgeon: Algernon Huxley, MD;  Location: [place] [facility];  Service: Cardiovascular;  Laterality: Right;   LOWER EXTREMITY INTERVENTION  08/08/2017   Procedure: LOWER EXTREMITY INTERVENTION;  Surgeon: Algernon Huxley, MD;  Location: [place] [facility];  Service: Cardiovascular;;   LOWER EXTREMITY INTERVENTION  08/22/2017   Procedure: LOWER EXTREMITY INTERVENTION;  Surgeon: Algernon Huxley, MD;  Location: [place] [facility];  Service: Cardiovascular;;    Family History  Problem Relation Age of Onset   Irritable bowel syndrome Sister    Diabetes Sister    Heart disease Mother    Diabetes Mother    Heart disease Father    Rheumatic fever Father        as child   Psoriasis Brother    Arthritis Brother    Diabetes Sister    Diabetes Sister     Allergies  Allergen Reactions   Methotrexate Other (See Comments)    Blood count drops   Vancomycin Shortness Of Breath    Eyes watering, SOB, wheezing   Cefepime Other (See Comments)   Tape     CBC Latest Ref Rng & Units 02/09/2021 02/08/2021 02/07/2021  WBC 4.0 - 10.5 K/uL 10.5 8.7 14.0(H)  Hemoglobin 13.0 - 17.0 g/dL 9.0(L) 8.9(L) 8.8(L)  Hematocrit 39.0 - 52.0 % 29.5(L) 28.2(L) 28.4(L)  Platelets 150 - 400 K/uL 135(L) 130(L) 119(L)      CMP     Component Value Date/Time   NA 129 (L) 02/09/2021 0448   NA 140 05/27/2020 1006   NA 134 (L) 07/24/2014 1524   K 4.4 02/09/2021 0448   K 4.3  07/24/2014 1524   CL 92 (L) 02/09/2021 0448   CL 103 07/24/2014 1524   CO2 22 02/09/2021 0448   CO2 21 07/24/2014 1524   GLUCOSE 170 (H) 02/09/2021 0448   GLUCOSE 168 (H) 07/24/2014 1524   BUN 45 (H) 02/09/2021 0448   BUN 23 05/27/2020 1006   BUN 62 (H) 07/24/2014 1524   CREATININE 7.14 (H) 02/09/2021 0448   CREATININE 4.95 (H) 07/24/2014 1524   CALCIUM 8.2 (L) 02/09/2021 0448   CALCIUM 9.3 07/24/2014 1524   PROT 8.2 (H) 02/06/2021 1756   PROT 8.0 05/27/2020 1006   ALBUMIN 3.4 (L) 02/06/2021 1756   ALBUMIN 4.5 05/27/2020 1006   AST 13 (L) 02/06/2021 1756   ALT 8 02/06/2021 1756   ALKPHOS 64 02/06/2021 1756   BILITOT 0.9 02/06/2021 1756   BILITOT 0.5 05/27/2020 1006   GFRNONAA 8 (L) 02/09/2021 0448   GFRNONAA 13 (L) 07/24/2014 1524   GFRAA 14 (L) 05/27/2020 1006   GFRAA 16 (L) 07/24/2014 1524     No results found.     Assessment & Plan:   1. ESRD (end stage renal disease) ([place]) Recommend:  The patient is doing well and currently has adequate dialysis access. The patient's dialysis center is not reporting any access issues. Flow pattern is stable when compared to the prior ultrasound.  The patient should have a duplex ultrasound of the dialysis access in 6 months. The patient will follow-up with me in the office after each ultrasound     2. Type 2 diabetes mellitus with other diabetic kidney complication, with long-term current use of insulin (HCC) Continue hypoglycemic medications as already ordered, these medications have been reviewed and there are no changes at this time.  Hgb A1C to be monitored as already arranged by primary service   3. Hypercholesteremia Continue statin as ordered and reviewed, no changes at this time    Current Outpatient Medications on File Prior to Visit  Medication Sig Dispense Refill   acetaminophen (TYLENOL) 500 MG tablet Take 1,000 mg by mouth daily as needed for moderate  pain or headache.      Adalimumab ([name] PEN) 40 MG/0.4ML  PNKT Inject 40 mg into the muscle once a week.      Alcohol Swabs PADS Use as directed to check blood sugar three times daily for insulin dependent type 2 diabetes. 100 each 12   amLODipine (NORVASC) 10 MG tablet TAKE 1 TABLET(10 MG) BY MOUTH DAILY AS NEEDED 90 tablet 0   atorvastatin (LIPITOR) 80 MG tablet TAKE 1 TABLET(80 MG) BY MOUTH DAILY (Patient taking differently: Take 80 mg by mouth daily.) 90 tablet 3   AURYXIA 1 GM 210 MG(Fe) tablet Take 420 mg by mouth in the morning and at bedtime.      Blood Glucose Monitoring Suppl (ONE TOUCH ULTRA 2) w/Device KIT Use as directed to check blood sugar three times daily. E11.9 1 each 0   carvedilol (COREG) 25 MG tablet Take 1 tablet (25 mg total) by mouth 2 (two) times daily. 60 tablet 12   cyanocobalamin (,VITAMIN B-12,) 1000 MCG/ML injection Inject 74m once a week for 4 weeks and then once a month for 3 months 7 mL 0   cyproheptadine (PERIACTIN) 4 MG tablet Take 1 tablet (4 mg total) by mouth 2 (two) times daily. Before meals 60 tablet 2   furosemide (LASIX) 80 MG tablet Take 1 tablet (80 mg total) by mouth 2 (two) times daily. 180 tablet 1   gabapentin (NEURONTIN) 100 MG capsule Take 100 mg by mouth at bedtime. Take along with 3066mcapsule at bedtime     hydrALAZINE (APRESOLINE) 50 MG tablet Take 1 tablet (50 mg total) by mouth every 8 (eight) hours. 90 tablet 5   levETIRAcetam (KEPPRA) 500 MG tablet Take 1 tablet (500 mg total) by mouth daily AND 1 tablet (500 mg total) every Monday, Wednesday, and Friday. After dialysis. 110 tablet 1   lisinopril (ZESTRIL) 40 MG tablet Take 40 mg by mouth daily.     sevelamer carbonate (RENVELA) 800 MG tablet TAKE 1 TABLET(800 MG) BY MOUTH THREE TIMES DAILY (Patient taking differently: Take 800 mg by mouth 3 (three) times daily with meals.) 270 tablet 0   spironolactone (ALDACTONE) 25 MG tablet Take 1 tablet by mouth at bedtime.     cloNIDine (CATAPRES) 0.1 MG tablet Take 1 tablet (0.1 mg total) by mouth 2 (two)  times daily. 60 tablet 0   No current facility-administered medications on file prior to visit.    There are no Patient Instructions on file for this visit. No follow-ups on file.   FaKris HartmannNP

## 2021-04-09 ENCOUNTER — Other Ambulatory Visit: Payer: Medicare Other | Admitting: Nurse Practitioner

## 2021-04-09 ENCOUNTER — Encounter: Payer: Self-pay | Admitting: Nurse Practitioner

## 2021-04-09 ENCOUNTER — Other Ambulatory Visit: Payer: Self-pay

## 2021-04-09 DIAGNOSIS — Z515 Encounter for palliative care: Secondary | ICD-10-CM

## 2021-04-09 DIAGNOSIS — E538 Deficiency of other specified B group vitamins: Secondary | ICD-10-CM

## 2021-04-09 DIAGNOSIS — R630 Anorexia: Secondary | ICD-10-CM

## 2021-04-09 NOTE — Progress Notes (Signed)
[facility] Consult Note Telephone: [phone]  Fax: [phone]    Date of encounter: 04/09/21 PATIENT NAME: [name] [id number]   [phone] (home)  DOB: 01-24-1961 MRN: [id number] PRIMARY CARE PROVIDER:    [name], MD,  [address] [place] [address] [phone]  RESPONSIBLE PARTY:    Contact Information     Name Relation Home Work Mobile   [name] Spouse [phone]     [name] Daughter [phone]        I met face to face with patient and wife in home. Palliative Care was asked to follow this patient by consultation request of  [name], [name], MD to address advance care planning and complex medical decision making. This is a follow up visit.  Symptom Management/Plan: 1. ACP; full code; blank MOST form left for further review and discussion. [name] wishes to continue hemodialysis, aggressive interventions   2. Debility secondary to ESRD/HD with generalized weakness; continue to encourage mobility, exercises as able.  3. Anorexia; discussed nutrition; speech evaluation not scheduled at home, will check to see which agency. We talked about supplements, weight and he is getting close to needed to get smaller clothes.    4. Goals of Care: Goals include to maximize quality of life and symptom management. Our advance care planning conversation included a discussion about:    The value and importance of advance care planning  Exploration of personal, cultural or spiritual beliefs that might influence medical decisions  Exploration of goals of care in the event of a sudden injury or illness  Identification and preparation of a healthcare agent  Review and updating or creation of an advance directive document.   5. Palliative care encounter; Palliative care encounter; Palliative medicine team will continue to support patient, patient's family, and  medical team. Visit consisted of counseling and education dealing with the complex and emotionally intense issues of symptom management and palliative care in the setting of serious and potentially life-threatening illness  6. B12 deficiency; administered 1071m/ml to left deltoid IM; lot ##[id number] exp 06/2022; follow up with [name] FCaryn Sectionto see if he wants to continue on injections or can switch to oral. [name] in agreement, may need level repeated  Follow up Palliative Care Visit: Palliative care will continue to follow for complex medical decision making, advance care planning, and clarification of goals. Return 8 weeks or prn.  I spent 60 minutes providing this consultation. More than 50% of the time in this consultation was spent in counseling and care coordination.  PPS: 50%  HOSPICE ELIGIBILITY/DIAGNOSIS: TBD  Chief Complaint: initial palliative consult for complex medical decision making  HISTORY OF PRESENT ILLNESS:  [name] is a 60y.o. year old male  with multiple medical problems including  End stage renal disease currently on hemodialysis, Stroke, intracerebral hemorrhage, diabetes, anemia, Crohn's disease, hypertension, hidradenitis supportive, DVT, AV fistula gram, left amputation finger, bilateral below knee amputation, dialysis permacath. 4/ 22/2022 to 4/26/ 2022 four acute metabolic encephalopathy with vascular dementia, breakthrough seizure, sepsis secondary to Cellulitis right buttocks, electrolyte imbalance, and sage renal disease on hemodialysis Monday, Wednesday, Friday. Same day admission 02/24/2021 for dialysis access not working followed by [name] SDelana Meyer I called [name] to confirm PC visit. I visited [name] in their home. [name] was sitting in the w/c at the kitchen table. We talked about purpose of PC visit. We talked about  how [name] has been feeling. We talked about symptoms of pain which currently he is comfortable. [name] denies  shortness of breath. We talked about appetite which is poor. We talked about [name] eating a good breakfast but then after his appetite declines through the day to little to eat for breakfast. We talked about nutrition, education. We talked about speech to evaluate for swallowing but that did not occur, will [name]/u. We talked about vitamin B12 injection where the [facility] home health nurse has been administering. The last dose is at the home 2 weeks late and last [place] declined to give per Ms. Brady as she did not have an order. B12 injection given. We talked about following up with [name] Caryn Brady to see if level needs to be repeated, if can switch to oral or would need to stay on injections. We talked about chronic disease progression of ESRD on HD. We talked about HD. [name] continues to ride transportation independently to and from HD. We talked about self care as able, self independence. We talked about medical goals of care. We talked about role PC in poc. We talked about [name]/u pc visit, scheduled. Therapeutic listening, emotional support provided. Questions answered to satisfaction  History obtained from review of EMR, discussion with Mrs and [name].  I reviewed available labs, medications, imaging, studies and related documents from the EMR.  Records reviewed and summarized above.   ROS Full 14 system review of systems performed and negative with exception of: as per HPI.   Physical Exam: Constitutional: NAD General: frail appearing, thin, chronically ill, debilitated male EYES: lids intact ENMT: oral mucous membranes moist, CV: S1S2, RRR, no LE edema Pulmonary: LCTA, no increased work of breathing, room air Abdomen: oft and non tender MSK: lift to w/c  Skin: warm and dry Neuro:  + generalized weakness,  no cognitive impairment Psych: non-anxious affect, A and O x 3  Questions and concerns were addressed. The patient/wifewas encouraged to call with questions and/or  concerns. My contact information was provided. Provided general support and encouragement, no other unmet needs identified   Thank you for the opportunity to participate in the care of [name].  The palliative care team will continue to follow. Please call our office at [phone] if we can be of additional assistance.   This chart was dictated using voice recognition software.  Despite best efforts to proofread,  errors can occur which can change the documentation meaning.   Yesika Rispoli Z Gael Delude, NP , MSN, ACHPN  COVID-19 PATIENT SCREENING TOOL Asked and negative response unless otherwise noted:   Have you had symptoms of covid, tested positive or been in contact with someone with symptoms/positive test in the past 5-10 days? NO

## 2021-04-10 ENCOUNTER — Telehealth: Payer: Self-pay

## 2021-04-10 NOTE — Telephone Encounter (Signed)
I haven't seen it in the incoming faxes.

## 2021-04-10 NOTE — Telephone Encounter (Signed)
I don't have this fax.

## 2021-04-10 NOTE — Telephone Encounter (Signed)
Vance Gather calling from [facility] is calling to request if order for [facility] for can be resent. [facility] states that they never received the signed orders for speech.  [facility] Fax- [phone] Meriel FlavorsAlmyra [phone]

## 2021-04-10 NOTE — Telephone Encounter (Signed)
I called [name] with [facility] and advised her that we never received the order to sign. [name] states that she is going to refax the order. Awaiting fax.

## 2021-04-10 NOTE — Telephone Encounter (Signed)
Did we ever receive a fax for Dr. Caryn Section to sign?

## 2021-04-10 NOTE — Telephone Encounter (Signed)
[name] Kelp do you have this form or do you know whether we received this form?

## 2021-04-10 NOTE — Telephone Encounter (Signed)
936 am.  Request received from [place], NP for Palliative Care to follow up on ST request from last month.  Phone call made to [facility] and message has been left requesting a call back.   940 am.  Incoming call from [facility].  Patient is currently an active patient until June 26 th. No orders were received from PCP office for [place].    947 am.  Phone call made to PCP office with request to fax orders to [facility].  Fax # [phone].

## 2021-04-13 ENCOUNTER — Telehealth: Payer: Medicare Other

## 2021-05-04 ENCOUNTER — Ambulatory Visit: Payer: Self-pay | Admitting: *Deleted

## 2021-05-04 ENCOUNTER — Other Ambulatory Visit: Payer: Self-pay | Admitting: Family Medicine

## 2021-05-04 ENCOUNTER — Telehealth: Payer: Self-pay

## 2021-05-04 NOTE — Telephone Encounter (Signed)
Reason for Disposition . MILD weakness (i.e., does not interfere with ability to work, go to school, normal activities) (Exception: mild weakness is a chronic symptom)  Answer Assessment - Initial Assessment Questions 1. MECHANISM: "How did the fall happen?"     [address] out of bed 2. DOMESTIC VIOLENCE AND ELDER ABUSE SCREENING: "Did you fall because someone pushed you or tried to hurt you?" If Yes, ask: "Are you safe now?"     No 3. ONSET: "When did the fall happen?" (e.g., minutes, hours, or days ago)     Middle of night 4. LOCATION: "What part of the body hit the ground?" (e.g., back, buttocks, head, hips, knees, hands, head, stomach)     Shoulder bruise 5. INJURY: "Did you hurt (injure) yourself when you fell?" If Yes, ask: "What did you injure? Tell me more about this?" (e.g., body area; type of injury; pain severity)"     Shoulder 6. PAIN: "Is there any pain?" If Yes, ask: "How bad is the pain?" (e.g., Scale 1-10; or mild,  moderate, severe)   - NONE (0): No pain   - MILD (1-3): Doesn't interfere with normal activities    - MODERATE (4-7): Interferes with normal activities or awakens from sleep    - SEVERE (8-10): Excruciating pain, unable to do any normal activities      None 7. SIZE: For cuts, bruises, or swelling, ask: "How large is it?" (e.g., inches or centimeters)      No cut 8. PREGNANCY: "Is there any chance you are pregnant?" "When was your last menstrual period?"     N/a 9. OTHER SYMPTOMS: "Do you have any other symptoms?" (e.g., dizziness, fever, weakness; new onset or worsening).      Feels winded, weaker 10. CAUSE: "What do you think caused the fall (or falling)?" (e.g., tripped, dizzy spell)       Unsure  Protocols used: Falls and [facility]

## 2021-05-04 NOTE — Telephone Encounter (Signed)
Patient fell yesterday 05/03/21   Patient's wife would like to be contacted by a member of staff to discuss further if possible   Patient was not with wife at the time of call and will be home around 3:30 PM   Please contact further   Attempted to call contact number for patient- left message on wife cell to call back- would like to speak to patient.

## 2021-05-04 NOTE — Telephone Encounter (Signed)
Did you want to work the patient in? Please review. Thanks!

## 2021-05-04 NOTE — Telephone Encounter (Signed)
I could see him Monday the 25th at 3:40. That's soonest I can work him in. If he gets any worse in meantime he will need to go to ER.

## 2021-05-04 NOTE — Telephone Encounter (Signed)
Another message has been sent to Dr. Caryn Section regarding this.

## 2021-05-04 NOTE — Telephone Encounter (Signed)
Wife reports pt. Has "been falling more often." Fell last Thursday and this morning pt. [place] out of bed. Bruise to shoulder, but no pain. No other injury. Wife states he seems weaker. Pt. Transfers into wheelchair, does not ambulate anymore per wife.Has an appointment 05/26/21, but would like to be seen sooner for this. Will see any provider available. Please advise wife. Also needs refill on Keppra 500 mg.

## 2021-05-04 NOTE — Telephone Encounter (Signed)
Please advise 

## 2021-05-04 NOTE — Telephone Encounter (Signed)
  Notes to clinic:  medication was filled by a different provider  Review for continued use and refill    Requested Prescriptions  Pending Prescriptions Disp Refills   levETIRAcetam (KEPPRA) 500 MG tablet [Pharmacy Med Name: LEVETIRACETAM 500MG TABLETS] 110 tablet 1    Sig: TAKE 1 TABLET BY MOUTH DAILY AND 1 TABLET BY MOUTH EVERY MONDAY, [place]      Not Delegated - Neurology:  Anticonvulsants Failed - 05/04/2021 11:45 AM      Failed - This refill cannot be delegated      Failed - HCT in normal range and within 360 days    HCT  Date Value Ref Range Status  02/09/2021 29.5 (L) 39.0 - 52.0 % Final   Hematocrit  Date Value Ref Range Status  12/02/2020 24.9 (L) 37.5 - 51.0 % Final          Failed - HGB in normal range and within 360 days    Hemoglobin  Date Value Ref Range Status  02/09/2021 9.0 (L) 13.0 - 17.0 g/dL Final  12/02/2020 8.0 (L) 13.0 - 17.7 g/dL Final          Failed - PLT in normal range and within 360 days    Platelets  Date Value Ref Range Status  02/09/2021 135 (L) 150 - 400 K/uL Final  12/02/2020 134 (L) 150 - 450 x10E3/uL Final          Passed - WBC in normal range and within 360 days    WBC  Date Value Ref Range Status  02/09/2021 10.5 4.0 - 10.5 K/uL Final          Passed - Valid encounter within last 12 months    Recent Outpatient Visits           2 months ago Cellulitis of buttock   [facility] [name], MD   5 months ago Anemia in ESRD (end-stage renal disease) ([place])   [place], Donald E, MD   8 months ago Primary hypertension   [facility] [name], MD   11 months ago Type 2 diabetes mellitus with other diabetic kidney complication, with long-term current use of insulin [facility])   [facility] [name], MD   1 year ago Type 2 diabetes mellitus with other diabetic kidney complication, with long-term current use of insulin  [facility])   [facility] [name], MD       Future Appointments             In 3 weeks [name], [name], MD [facility]

## 2021-05-04 NOTE — Telephone Encounter (Signed)
Advised patient. Appt scheduled.

## 2021-05-04 NOTE — Telephone Encounter (Signed)
Copied from [place] [id number]. Topic: Appointment Scheduling - Scheduling Inquiry for Clinic >> May 04, 2021 11:32 AM Tessa Lerner A wrote: Reason for CRM: Patient's wife would like to be contacted to schedule an office appt to see Dr. Caryn Section   The patient has fallen recently and their wife is concerned  Agent was unable to successfully schedule at the time of call   Please contact to further advise

## 2021-05-05 ENCOUNTER — Telehealth: Payer: Self-pay

## 2021-05-05 NOTE — Telephone Encounter (Signed)
Copied from [place] [phone] [id number]. Topic: General - Other >> May 04, 2021  5:01 PM Loma [place] wrote: Reason for CRM: Dr Lynnea Ferrier with D[facility] Unit calling in re to pt decline and wanting a cb from Dr Caryn Section tomorrow if possible. Her # is [phone]

## 2021-05-11 ENCOUNTER — Other Ambulatory Visit: Payer: Self-pay

## 2021-05-11 ENCOUNTER — Ambulatory Visit (INDEPENDENT_AMBULATORY_CARE_PROVIDER_SITE_OTHER): Payer: Medicare Other | Admitting: Family Medicine

## 2021-05-11 ENCOUNTER — Encounter: Payer: Self-pay | Admitting: Family Medicine

## 2021-05-11 VITALS — BP 167/72 | HR 67 | Wt 140.0 lb

## 2021-05-11 DIAGNOSIS — R0600 Dyspnea, unspecified: Secondary | ICD-10-CM

## 2021-05-11 DIAGNOSIS — R059 Cough, unspecified: Secondary | ICD-10-CM | POA: Diagnosis not present

## 2021-05-11 NOTE — Patient Instructions (Signed)
Please review the attached list of medications and notify my office if there are any errors.   Please go to the lab draw station in [address] on the second floor of [facility] . Normal hours are 8:00am to 11:30am and 1:00pm to 4:00pm Monday through Friday   Go to the [facility] on [facility] for chest Xray

## 2021-05-11 NOTE — Telephone Encounter (Signed)
Patient seen 05-10-2021

## 2021-05-11 NOTE — Progress Notes (Signed)
Established patient visit   Patient: [name]   DOB: 1961/07/31   60 y.o. Male  MRN: [id number] Visit Date: 05/11/2021  Today's healthcare provider: [name], MD   Chief Complaint  Patient presents with   Shortness of Breath   Subjective    Shortness of Breath This is a new problem. The current episode started in the past 7 days. Associated symptoms include sputum production. Pertinent negatives include no fever, headaches or wheezing. The symptoms are aggravated by lying flat. He has tried nothing for the symptoms.   He has also had cough sometimes productive of yellow phlegm for several months. Has been very fatigued with poor appetite the last several weeks as well. No fevers, chills, sweats, chest pain, or abdominal pain.     Medications: Outpatient Medications Prior to Visit  Medication Sig   acetaminophen (TYLENOL) 500 MG tablet Take 1,000 mg by mouth daily as needed for moderate pain or headache.    Adalimumab ([name] PEN) 40 MG/0.4ML PNKT Inject 40 mg into the muscle once a week.    Alcohol Swabs PADS Use as directed to check blood sugar three times daily for insulin dependent type 2 diabetes.   amLODipine (NORVASC) 10 MG tablet TAKE 1 TABLET(10 MG) BY MOUTH DAILY AS NEEDED   atorvastatin (LIPITOR) 80 MG tablet TAKE 1 TABLET(80 MG) BY MOUTH DAILY (Patient taking differently: Take 80 mg by mouth daily.)   AURYXIA 1 GM 210 MG(Fe) tablet Take 420 mg by mouth in the morning and at bedtime.    Blood Glucose Monitoring Suppl (ONE TOUCH ULTRA 2) w/Device KIT Use as directed to check blood sugar three times daily. E11.9   carvedilol (COREG) 25 MG tablet Take 1 tablet (25 mg total) by mouth 2 (two) times daily.   cyanocobalamin (,VITAMIN B-12,) 1000 MCG/ML injection Inject 72m once a week for 4 weeks and then once a month for 3 months   cyproheptadine (PERIACTIN) 4 MG tablet Take 1 tablet (4 mg total) by mouth 2 (two) times daily. Before meals   furosemide (LASIX)  80 MG tablet Take 1 tablet (80 mg total) by mouth 2 (two) times daily.   gabapentin (NEURONTIN) 100 MG capsule Take 100 mg by mouth at bedtime. Take along with 3035mcapsule at bedtime   gabapentin (NEURONTIN) 300 MG capsule Take 1 capsule (300 mg total) by mouth at bedtime.   hydrALAZINE (APRESOLINE) 50 MG tablet Take 1 tablet (50 mg total) by mouth every 8 (eight) hours.   levETIRAcetam (KEPPRA) 500 MG tablet TAKE 1 TABLET BY MOUTH DAILY AND 1 TABLET BY MOUTH EVERY MONDAY, WEDNESDAY AND FRIDAY AFTER DIALYSIS   lisinopril (ZESTRIL) 40 MG tablet Take 40 mg by mouth daily.   sevelamer carbonate (RENVELA) 800 MG tablet TAKE 1 TABLET(800 MG) BY MOUTH THREE TIMES DAILY (Patient taking differently: Take 800 mg by mouth 3 (three) times daily with meals.)   spironolactone (ALDACTONE) 25 MG tablet Take 1 tablet by mouth at bedtime.   cloNIDine (CATAPRES) 0.1 MG tablet Take 1 tablet (0.1 mg total) by mouth 2 (two) times daily.   No facility-administered medications prior to visit.    Review of Systems  Constitutional:  Positive for activity change, appetite change and fatigue. Negative for chills, diaphoresis, fever and unexpected weight change.  HENT: Negative.    Respiratory:  Positive for sputum production and shortness of breath. Negative for apnea, cough, choking, chest tightness, wheezing and stridor.   Cardiovascular: Negative.   Gastrointestinal:  Negative.   Musculoskeletal:  Negative for back pain.  Neurological:  Positive for weakness. Negative for dizziness, light-headedness and headaches.      Objective    BP (!) 167/72 (BP Location: Right Arm, Patient Position: Sitting, Cuff Size: Normal)   Pulse 67   Wt 140 lb (63.5 kg) Comment: Per Pt.  SpO2 100%   BMI 19.53 kg/m     Physical Exam    General: Appearance:    Thin male in no acute distress  Eyes:    PERRL, conjunctiva/corneas clear, EOM's intact       Lungs:     Clear to auscultation bilaterally, respirations unlabored   Heart:    Normal heart rate. Normal rhythm. No murmurs, rubs, or gallops.         Assessment & Plan     1. Cough  - CBC - Comprehensive metabolic panel - Brain natriuretic peptide - TSH - DG Chest 2 View; Future  2. Dyspnea, unspecified type  - CBC - Comprehensive metabolic panel - Brain natriuretic peptide - TSH - DG Chest 2 View; Future        The entirety of the information documented in the History of Present Illness, Review of Systems and Physical Exam were personally obtained by me. Portions of this information were initially documented by the CMA and reviewed by me for thoroughness and accuracy.     [name], MD  [facility] [phone] (phone) [phone] (fax)  [place]

## 2021-05-12 ENCOUNTER — Inpatient Hospital Stay
Admission: EM | Admit: 2021-05-12 | Discharge: 2021-05-15 | DRG: 640 | Disposition: A | Discharge status: Home Health | Payer: Medicare Other | Attending: Internal Medicine | Admitting: Internal Medicine

## 2021-05-12 ENCOUNTER — Ambulatory Visit
Admission: RE | Admit: 2021-05-12 | Discharge: 2021-05-12 | Disposition: A | Discharge status: Home / Self Care | Payer: Medicare Other | Source: Home / Self Care | Attending: Family Medicine | Admitting: Family Medicine

## 2021-05-12 ENCOUNTER — Ambulatory Visit
Admission: RE | Admit: 2021-05-12 | Discharge: 2021-05-12 | Disposition: A | Discharge status: Home / Self Care | Payer: Medicare Other | Source: Ambulatory Visit | Attending: Family Medicine | Admitting: Family Medicine

## 2021-05-12 ENCOUNTER — Emergency Department: Payer: Medicare Other

## 2021-05-12 ENCOUNTER — Other Ambulatory Visit: Payer: Self-pay

## 2021-05-12 ENCOUNTER — Telehealth: Payer: Self-pay | Admitting: *Deleted

## 2021-05-12 ENCOUNTER — Encounter: Payer: Self-pay | Admitting: *Deleted

## 2021-05-12 DIAGNOSIS — E8770 Fluid overload, unspecified: Secondary | ICD-10-CM | POA: Diagnosis present

## 2021-05-12 DIAGNOSIS — K5090 Crohn's disease, unspecified, without complications: Secondary | ICD-10-CM | POA: Diagnosis present

## 2021-05-12 DIAGNOSIS — Z992 Dependence on renal dialysis: Secondary | ICD-10-CM | POA: Diagnosis not present

## 2021-05-12 DIAGNOSIS — K922 Gastrointestinal hemorrhage, unspecified: Secondary | ICD-10-CM | POA: Diagnosis present

## 2021-05-12 DIAGNOSIS — I5021 Acute systolic (congestive) heart failure: Secondary | ICD-10-CM | POA: Diagnosis not present

## 2021-05-12 DIAGNOSIS — E1122 Type 2 diabetes mellitus with diabetic chronic kidney disease: Secondary | ICD-10-CM | POA: Diagnosis present

## 2021-05-12 DIAGNOSIS — Z833 Family history of diabetes mellitus: Secondary | ICD-10-CM | POA: Diagnosis not present

## 2021-05-12 DIAGNOSIS — N2581 Secondary hyperparathyroidism of renal origin: Secondary | ICD-10-CM | POA: Diagnosis present

## 2021-05-12 DIAGNOSIS — E43 Unspecified severe protein-calorie malnutrition: Secondary | ICD-10-CM | POA: Insufficient documentation

## 2021-05-12 DIAGNOSIS — I5023 Acute on chronic systolic (congestive) heart failure: Secondary | ICD-10-CM | POA: Diagnosis present

## 2021-05-12 DIAGNOSIS — E785 Hyperlipidemia, unspecified: Secondary | ICD-10-CM | POA: Diagnosis present

## 2021-05-12 DIAGNOSIS — Z20822 Contact with and (suspected) exposure to covid-19: Secondary | ICD-10-CM | POA: Diagnosis present

## 2021-05-12 DIAGNOSIS — R059 Cough, unspecified: Secondary | ICD-10-CM | POA: Insufficient documentation

## 2021-05-12 DIAGNOSIS — I132 Hypertensive heart and chronic kidney disease with heart failure and with stage 5 chronic kidney disease, or end stage renal disease: Secondary | ICD-10-CM | POA: Diagnosis present

## 2021-05-12 DIAGNOSIS — J810 Acute pulmonary edema: Secondary | ICD-10-CM

## 2021-05-12 DIAGNOSIS — R296 Repeated falls: Secondary | ICD-10-CM | POA: Diagnosis present

## 2021-05-12 DIAGNOSIS — R0602 Shortness of breath: Secondary | ICD-10-CM

## 2021-05-12 DIAGNOSIS — Z89511 Acquired absence of right leg below knee: Secondary | ICD-10-CM

## 2021-05-12 DIAGNOSIS — E876 Hypokalemia: Secondary | ICD-10-CM | POA: Diagnosis present

## 2021-05-12 DIAGNOSIS — Z888 Allergy status to other drugs, medicaments and biological substances status: Secondary | ICD-10-CM

## 2021-05-12 DIAGNOSIS — Z89512 Acquired absence of left leg below knee: Secondary | ICD-10-CM

## 2021-05-12 DIAGNOSIS — Z87891 Personal history of nicotine dependence: Secondary | ICD-10-CM

## 2021-05-12 DIAGNOSIS — N186 End stage renal disease: Secondary | ICD-10-CM | POA: Diagnosis present

## 2021-05-12 DIAGNOSIS — G40909 Epilepsy, unspecified, not intractable, without status epilepticus: Secondary | ICD-10-CM | POA: Diagnosis present

## 2021-05-12 DIAGNOSIS — J9811 Atelectasis: Secondary | ICD-10-CM | POA: Diagnosis present

## 2021-05-12 DIAGNOSIS — Z86718 Personal history of other venous thrombosis and embolism: Secondary | ICD-10-CM | POA: Diagnosis not present

## 2021-05-12 DIAGNOSIS — Z91048 Other nonmedicinal substance allergy status: Secondary | ICD-10-CM

## 2021-05-12 DIAGNOSIS — R627 Adult failure to thrive: Secondary | ICD-10-CM | POA: Diagnosis present

## 2021-05-12 DIAGNOSIS — Z9115 Patient's noncompliance with renal dialysis: Secondary | ICD-10-CM

## 2021-05-12 DIAGNOSIS — Z8673 Personal history of transient ischemic attack (TIA), and cerebral infarction without residual deficits: Secondary | ICD-10-CM

## 2021-05-12 DIAGNOSIS — Z8249 Family history of ischemic heart disease and other diseases of the circulatory system: Secondary | ICD-10-CM | POA: Diagnosis not present

## 2021-05-12 DIAGNOSIS — Z8261 Family history of arthritis: Secondary | ICD-10-CM | POA: Diagnosis not present

## 2021-05-12 DIAGNOSIS — R0600 Dyspnea, unspecified: Secondary | ICD-10-CM | POA: Insufficient documentation

## 2021-05-12 DIAGNOSIS — E1165 Type 2 diabetes mellitus with hyperglycemia: Secondary | ICD-10-CM | POA: Diagnosis present

## 2021-05-12 DIAGNOSIS — D631 Anemia in chronic kidney disease: Secondary | ICD-10-CM | POA: Diagnosis present

## 2021-05-12 DIAGNOSIS — R21 Rash and other nonspecific skin eruption: Secondary | ICD-10-CM | POA: Diagnosis present

## 2021-05-12 DIAGNOSIS — Z881 Allergy status to other antibiotic agents status: Secondary | ICD-10-CM

## 2021-05-12 DIAGNOSIS — E8779 Other fluid overload: Secondary | ICD-10-CM

## 2021-05-12 DIAGNOSIS — Z79899 Other long term (current) drug therapy: Secondary | ICD-10-CM

## 2021-05-12 DIAGNOSIS — L732 Hidradenitis suppurativa: Secondary | ICD-10-CM | POA: Diagnosis present

## 2021-05-12 LAB — COMPREHENSIVE METABOLIC PANEL
ALT: 4 IU/L (ref 0–44)
AST: 14 IU/L (ref 0–40)
Albumin/Globulin Ratio: 0.9 — ABNORMAL LOW (ref 1.2–2.2)
Albumin: 3.3 g/dL — ABNORMAL LOW (ref 3.8–4.9)
Alkaline Phosphatase: 79 IU/L (ref 44–121)
BUN/Creatinine Ratio: 6 — ABNORMAL LOW (ref 9–20)
BUN: 22 mg/dL (ref 6–24)
Bilirubin Total: 0.5 mg/dL (ref 0.0–1.2)
CO2: 31 mmol/L — ABNORMAL HIGH (ref 20–29)
Calcium: 8.2 mg/dL — ABNORMAL LOW (ref 8.7–10.2)
Chloride: 96 mmol/L (ref 96–106)
Creatinine, Ser: 3.63 mg/dL — ABNORMAL HIGH (ref 0.76–1.27)
Globulin, Total: 3.7 g/dL (ref 1.5–4.5)
Glucose: 217 mg/dL — ABNORMAL HIGH (ref 65–99)
Potassium: 3.3 mmol/L — ABNORMAL LOW (ref 3.5–5.2)
Sodium: 140 mmol/L (ref 134–144)
Total Protein: 7 g/dL (ref 6.0–8.5)
eGFR: 19 mL/min/{1.73_m2} — ABNORMAL LOW (ref >59)

## 2021-05-12 LAB — CBC
HCT: 26.8 % — ABNORMAL LOW (ref 39.0–52.0)
Hematocrit: 25.8 % — ABNORMAL LOW (ref 37.5–51.0)
Hemoglobin: 8 g/dL — ABNORMAL LOW (ref 13.0–17.7)
Hemoglobin: 8.2 g/dL — ABNORMAL LOW (ref 13.0–17.0)
MCH: 24.5 pg — ABNORMAL LOW (ref 26.6–33.0)
MCH: 25.4 pg — ABNORMAL LOW (ref 26.0–34.0)
MCHC: 31 g/dL — ABNORMAL LOW (ref 31.5–35.7)
MCHC: 30.6 g/dL (ref 30.0–36.0)
MCV: 79 fL (ref 79–97)
MCV: 83 fL (ref 80.0–100.0)
Platelets: 136 10*3/uL — ABNORMAL LOW (ref 150–450)
Platelets: 154 10*3/uL (ref 150–400)
RBC: 3.26 x10E6/uL — ABNORMAL LOW (ref 4.14–5.80)
RBC: 3.23 MIL/uL — ABNORMAL LOW (ref 4.22–5.81)
RDW: 19.4 % — ABNORMAL HIGH (ref 11.6–15.4)
RDW: 20.4 % — ABNORMAL HIGH (ref 11.5–15.5)
WBC: 5.8 10*3/uL (ref 3.4–10.8)
WBC: 6.2 10*3/uL (ref 4.0–10.5)
nRBC: 0 % (ref 0.0–0.2)

## 2021-05-12 LAB — BASIC METABOLIC PANEL
Anion gap: 11 (ref 5–15)
BUN: 37 mg/dL — ABNORMAL HIGH (ref 6–20)
CO2: 30 mmol/L (ref 22–32)
Calcium: 8.2 mg/dL — ABNORMAL LOW (ref 8.9–10.3)
Chloride: 95 mmol/L — ABNORMAL LOW (ref 98–111)
Creatinine, Ser: 5.49 mg/dL — ABNORMAL HIGH (ref 0.61–1.24)
GFR, Estimated: 11 mL/min — ABNORMAL LOW (ref >60)
Glucose, Bld: 170 mg/dL — ABNORMAL HIGH (ref 70–99)
Potassium: 3.3 mmol/L — ABNORMAL LOW (ref 3.5–5.1)
Sodium: 136 mmol/L (ref 135–145)

## 2021-05-12 LAB — RESP PANEL BY RT-PCR (FLU A&B, COVID) ARPGX2
Influenza A by PCR: NEGATIVE
Influenza B by PCR: NEGATIVE
SARS Coronavirus 2 by RT PCR: NEGATIVE

## 2021-05-12 LAB — TROPONIN I (HIGH SENSITIVITY)
Troponin I (High Sensitivity): 32 ng/L — ABNORMAL HIGH (ref <18)
Troponin I (High Sensitivity): 33 ng/L — ABNORMAL HIGH (ref <18)

## 2021-05-12 LAB — BRAIN NATRIURETIC PEPTIDE
B Natriuretic Peptide: >4500 pg/mL — ABNORMAL HIGH (ref 0.0–100.0)
BNP: 3779.1 pg/mL — ABNORMAL HIGH (ref 0.0–100.0)

## 2021-05-12 LAB — TSH: TSH: 2.28 u[IU]/mL (ref 0.450–4.500)

## 2021-05-12 MED ORDER — SODIUM CHLORIDE 0.9 % IV SOLN: 500.0000 mg | INTRAVENOUS | Status: DC

## 2021-05-12 MED ORDER — SODIUM CHLORIDE 0.9 % IV SOLN: 2.0000 g | INTRAVENOUS | Status: DC

## 2021-05-12 MED ORDER — FUROSEMIDE 10 MG/ML IJ SOLN
100.0000 mg | Freq: Two times a day (BID) | INTRAVENOUS | Status: DC
  Administered 2021-05-13: 100 mg via INTRAVENOUS
  Filled 2021-05-12 (×2): qty 10

## 2021-05-12 MED ORDER — POTASSIUM CHLORIDE 10 MEQ/100ML IV SOLN
10.0000 meq | INTRAVENOUS | Status: AC
Start: 2021-05-12 — End: 2021-05-13
  Administered 2021-05-13 (×2): 10 meq via INTRAVENOUS
  Filled 2021-05-12: qty 100

## 2021-05-12 MED ORDER — ALBUTEROL SULFATE (2.5 MG/3ML) 0.083% IN NEBU: 2.5000 mg | INHALATION_SOLUTION | RESPIRATORY_TRACT | Status: DC | PRN

## 2021-05-12 MED ORDER — SODIUM CHLORIDE 0.9 % IV SOLN
500.0000 mg | INTRAVENOUS | Status: AC
  Administered 2021-05-13 – 2021-05-14 (×3): 500 mg via INTRAVENOUS
  Filled 2021-05-12 (×3): qty 500

## 2021-05-12 MED ORDER — LEVETIRACETAM 500 MG PO TABS
500.0000 mg | ORAL_TABLET | ORAL | Status: DC
  Administered 2021-05-13 – 2021-05-15 (×3): 500 mg via ORAL
  Filled 2021-05-12 (×3): qty 1

## 2021-05-12 MED ORDER — LEVOFLOXACIN IN D5W 750 MG/150ML IV SOLN: 750.0000 mg | INTRAVENOUS | Status: DC

## 2021-05-12 MED ORDER — SODIUM CHLORIDE 0.9 % IV SOLN
2.0000 g | INTRAVENOUS | Status: DC
  Administered 2021-05-13 – 2021-05-15 (×3): 2 g via INTRAVENOUS
  Filled 2021-05-12 (×5): qty 20

## 2021-05-12 MED ORDER — LACTATED RINGERS IV SOLN: INTRAVENOUS | Status: DC

## 2021-05-12 NOTE — Telephone Encounter (Signed)
[facility]: Diane Call report chest X-ray: IMPRESSION: 1. Right basilar chest densities are compatible with pleural fluid with atelectasis and consolidation. Cannot exclude right lower lobe pneumonia. 2. Interstitial pulmonary edema. 3. These results will be called to the ordering clinician or representative by the Radiologist Assistant, and communication documented in the PACS or [employer].

## 2021-05-12 NOTE — ED Triage Notes (Addendum)
Pt sent to er for eval of abnormal labs by dr Caryn Section.  Pt has intermittent sob.  No chest pain.  No n/v  pt alert  speech clear.  Pt is bil aka. Pt had dialysis yesterday.  Family with pt.

## 2021-05-12 NOTE — ED Provider Notes (Addendum)
[facility] Emergency Department Provider Note   ____________________________________________   Event Date/Time   First MD Initiated Contact with Patient 05/12/21 2052     (approximate)  I have reviewed the triage vital signs and the nursing notes.   HISTORY  Chief Complaint Abnormal Lab    HPI [name] is a 60 y.o. male with the below stated past medical history including end-stage renal disease on dialysis M/W/F who presents for worsening shortness of breath  LOCATION: Chest DURATION: 1 week prior to arrival TIMING: Worsening since onset SEVERITY: Moderate QUALITY: Shortness of breath CONTEXT: Patient has history of volume overload and states this feels similar MODIFYING FACTORS: Speaking worsens the shortness of breath and partially relieves when he is resting comfortably ASSOCIATED SYMPTOMS: Cough productive of white sputum   Per medical record review last dialysis session was 7/22          Past Medical History:  Diagnosis Date   Acute metabolic encephalopathy 05/24/5642   Anemia    Crohn disease ([place])    Diabetes mellitus without complication ([place])    DVT of lower extremity (deep venous thrombosis) ([place]) 2016   Empyema ([place]) 05/20/2017   Encephalopathy 12/04/2017   Fall at home, initial encounter 09/12/2020   Hidradenitis suppurativa    Hypertension    ICH (intracerebral hemorrhage) ([place])    Peritonitis ([place]) 04/21/2017   Pyogenic arthritis of knee ([place]) 02/04/2016   Renal disorder    Sepsis ([place]) 01/12/2018   Stroke [facility])     Patient Active Problem List   Diagnosis Date Noted   Cellulitis and abscess of buttock    History of GI bleed 02/06/2021   Long term current use of immunosuppressive drug 02/06/2021   Disorder of skin due to Crohn's disease ([place]) 32/95/1884   Complication of vascular access for dialysis 01/22/2021   Polyp of transverse colon    Acute metabolic encephalopathy 16/60/6301   Anemia in ESRD (end-stage  renal disease) ([place]) 60/07/9322   Chronic systolic CHF (congestive heart failure) ([place]) 11/18/2020   Cerebrovascular disease 09/12/2020   Transient alteration of awareness 09/12/2020   Hyperkalemia 09/12/2020   Mild cognitive impairment 09/30/2019   Peripheral vascular disease ([place]) 06/28/2018   Sepsis ([place]) 01/12/2018   Pressure injury of skin 12/04/2017   S/P bilateral BKA (below knee amputation) ([place]) 10/20/2017   Atherosclerosis of native arteries of extremity with rest pain ([place]) 08/05/2017   History of CVA (cerebrovascular accident) 04/15/2017   Seizure disorder ([place]) 04/15/2017   End stage renal failure on dialysis ([place]) 04/12/2016   Aphthae 02/20/2016   Hidradenitis suppurativa 02/20/2016   Leg pain 02/20/2016   Neuropathy 02/20/2016   Narrowing of intervertebral disc space 08/29/2015   Vascular disorder of lower extremity 08/29/2015   Failure of erection 08/29/2015   Hypercholesteremia 08/29/2015   Hypertension 08/29/2015   Anemia due to chronic kidney disease 05/26/2015   Venous insufficiency of leg 09/04/2014   History of deep vein thrombosis (DVT) of lower extremity 08/16/2014   Prostatic intraepithelial neoplasia 11/02/2013   Elevated prostate specific antigen (PSA) 09/11/2013   Benign prostatic hyperplasia with urinary obstruction 08/13/2013   Spermatocele 08/13/2013   Avitaminosis D 01/25/2013   Type 2 diabetes mellitus with kidney complication, with long-term current use of insulin ([place]) 03/28/2012   Crohn disease ([place]) 08/03/2011    Past Surgical History:  Procedure Laterality Date   A/V FISTULAGRAM Left 02/24/2021   Procedure: A/V FISTULAGRAM;  Surgeon: Katha Cabal, MD;  [facility]  INVASIVE CV LAB;  Service: Cardiovascular;  Laterality: Left;   ABDOMINAL SURGERY     AMPUTATION FINGER Left 06/2019   PR AMPUTATION LONG FINGER/THUMB+FLAPS [facility]   ANGIOPLASTY Left    left fem-pop at [facility] 04-11-2018   BELOW KNEE LEG AMPUTATION Right 08/2017   [facility]    COLONOSCOPY     COLONOSCOPY WITH PROPOFOL N/A 10/28/2020   Procedure: COLONOSCOPY WITH PROPOFOL;  Surgeon: Lin Landsman, MD;  Location: [place];  Service: Gastroenterology;  Laterality: N/A;   COLONOSCOPY WITH PROPOFOL N/A 11/21/2020   Procedure: COLONOSCOPY WITH PROPOFOL;  Surgeon: [name], MD;  Location: [facility] ENDOSCOPY;  Service: Endoscopy;  Laterality: N/A;   DIALYSIS/PERMA CATHETER INSERTION N/A 12/09/2017   Procedure: DIALYSIS/PERMA CATHETER INSERTION;  Surgeon: Katha Cabal, MD;  Location: [place] CV LAB;  Service: Cardiovascular;  Laterality: N/A;   DIALYSIS/PERMA CATHETER INSERTION N/A 12/12/2017   Procedure: DIALYSIS/PERMA CATHETER INSERTION;  Surgeon: Algernon Huxley, MD;  Location: [place] CV LAB;  Service: Cardiovascular;  Laterality: N/A;   DIALYSIS/PERMA CATHETER REMOVAL Left 12/09/2017   Procedure: DIALYSIS/PERMA CATHETER REMOVAL;  Surgeon: Katha Cabal, MD;  Location: [place] CV LAB;  Service: Cardiovascular;  Laterality: Left;   ESOPHAGOGASTRODUODENOSCOPY (EGD) WITH PROPOFOL N/A 11/20/2020   Procedure: ESOPHAGOGASTRODUODENOSCOPY (EGD) WITH PROPOFOL;  Surgeon: [name], MD;  Location: [facility] ENDOSCOPY;  Service: Endoscopy;  Laterality: N/A;   KNEE SURGERY Left 02/04/2016   [facility]   LEG AMPUTATION THROUGH LOWER TIBIA AND FIBULA Left 06/22/2018   [facility]   LOWER EXTREMITY ANGIOGRAPHY Right 08/08/2017   Procedure: Lower Extremity Angiography;  Surgeon: Algernon Huxley, MD;  Location: [place] CV LAB;  Service: Cardiovascular;  Laterality: Right;   LOWER EXTREMITY ANGIOGRAPHY Right 08/22/2017   Procedure: Lower Extremity Angiography;  Surgeon: Algernon Huxley, MD;  Location: [place] CV LAB;  Service: Cardiovascular;  Laterality: Right;   LOWER EXTREMITY INTERVENTION  08/08/2017   Procedure: LOWER EXTREMITY INTERVENTION;  Surgeon: Algernon Huxley, MD;  Location: [place] CV LAB;  Service: Cardiovascular;;   LOWER EXTREMITY INTERVENTION   08/22/2017   Procedure: LOWER EXTREMITY INTERVENTION;  Surgeon: Algernon Huxley, MD;  Location: [place] CV LAB;  Service: Cardiovascular;;    Prior to Admission medications   Medication Sig Start Date End Date Taking? Authorizing Provider  acetaminophen (TYLENOL) 500 MG tablet Take 1,000 mg by mouth daily as needed for moderate pain or headache.     [provider]  Adalimumab ([name] PEN) 40 MG/0.4ML PNKT Inject 40 mg into the muscle once a week.  11/27/18   [provider]  Alcohol Swabs PADS Use as directed to check blood sugar three times daily for insulin dependent type 2 diabetes. 10/20/17   [name], MD  amLODipine (NORVASC) 10 MG tablet TAKE 1 TABLET(10 MG) BY MOUTH DAILY AS NEEDED 03/02/21   [name], MD  atorvastatin (LIPITOR) 80 MG tablet TAKE 1 TABLET(80 MG) BY MOUTH DAILY Patient taking differently: Take 80 mg by mouth daily. 06/25/20   [name], MD  AURYXIA 1 GM 210 MG(Fe) tablet Take 420 mg by mouth in the morning and at bedtime.  10/23/18   [provider]  Blood Glucose Monitoring Suppl (ONE TOUCH ULTRA 2) w/Device KIT Use as directed to check blood sugar three times daily. E11.9 02/20/18   [name], MD  carvedilol (COREG) 25 MG tablet Take 1 tablet (25 mg total) by mouth 2 (two) times daily. 06/27/20   [name],  [name], MD  cloNIDine (CATAPRES) 0.1 MG tablet Take 1 tablet (0.1 mg total) by mouth 2 (two) times daily. 02/10/21 03/12/21  Max Sane, MD  cyanocobalamin (,VITAMIN B-12,) 1000 MCG/ML injection Inject 53m once a week for 4 weeks and then once a month for 3 months 09/23/20   VLin Landsman MD  cyproheptadine (PERIACTIN) 4 MG tablet Take 1 tablet (4 mg total) by mouth 2 (two) times daily. Before meals 02/17/21   FBirdie Sons MD  furosemide (LASIX) 80 MG tablet Take 1 tablet (80 mg total) by mouth 2 (two) times daily. 03/10/21   FBirdie Sons MD  gabapentin (NEURONTIN) 100 MG capsule Take 100 mg by mouth at  bedtime. Take along with 3024mcapsule at bedtime    [provider]  gabapentin (NEURONTIN) 300 MG capsule Take 1 capsule (300 mg total) by mouth at bedtime. 04/01/21 06/25/22  FiBirdie SonsMD  hydrALAZINE (APRESOLINE) 50 MG tablet Take 1 tablet (50 mg total) by mouth every 8 (eight) hours. 01/20/21   FiBirdie SonsMD  levETIRAcetam (KEPPRA) 500 MG tablet TAKE 1 TABLET BY MOUTH DAILY AND 1 TABLET BY MOUTH EVERY MONDAY[facility] 05/04/21   FiBirdie SonsMD  lisinopril (ZESTRIL) 40 MG tablet Take 40 mg by mouth daily. 12/17/20   [provider]  sevelamer carbonate (RENVELA) 800 MG tablet TAKE 1 TABLET(800 MG) BY MOUTH THREE TIMES DAILY Patient taking differently: Take 800 mg by mouth 3 (three) times daily with meals. 09/22/20   FiBirdie SonsMD  spironolactone (ALDACTONE) 25 MG tablet Take 1 tablet by mouth at bedtime. 12/17/20 12/17/21  [provider]    Allergies Methotrexate, Vancomycin, Cefepime, and Tape  Family History  Problem Relation Age of Onset   Irritable bowel syndrome Sister    Diabetes Sister    Heart disease Mother    Diabetes Mother    Heart disease Father    Rheumatic fever Father        as child   Psoriasis Brother    Arthritis Brother    Diabetes Sister    Diabetes Sister     Social History Social History   Tobacco Use   Smoking status: Former   Smokeless tobacco: Never   Tobacco comments:    smokes marijuana  Vaping Use   Vaping Use: Never used  Substance Use Topics   Alcohol use: No   Drug use: Yes    Types: Marijuana    Review of Systems Constitutional: No fever/chills Eyes: No visual changes. ENT: No sore throat. Cardiovascular: Denies chest pain. Respiratory: Endorses shortness of breath. Gastrointestinal: No abdominal pain.  No nausea, no vomiting.  No diarrhea. Genitourinary: Negative for dysuria. Musculoskeletal: Negative for acute arthralgias Skin: Negative for  rash. Neurological: Negative for headaches, weakness/numbness/paresthesias in any extremity Psychiatric: Negative for suicidal ideation/homicidal ideation   ____________________________________________   PHYSICAL EXAM:  VITAL SIGNS: ED Triage Vitals  Enc Vitals Group     BP 05/12/21 1930 (!) 165/68     Pulse Rate 05/12/21 1930 65     Resp 05/12/21 1930 20     Temp 05/12/21 1930 98.4 F (36.9 C)     Temp Source 05/12/21 1930 Oral     SpO2 05/12/21 1930 100 %     Weight 05/12/21 1924 140 lb (63.5 kg)     Height 05/12/21 1924 5' 11" (1.803 m)     Head Circumference --  Peak Flow --      Pain Score 05/12/21 1924 0     Pain Loc --      Pain Edu? --      Excl. in [place]? --    Constitutional: Alert and oriented. Well appearing and in no acute distress. Eyes: Conjunctivae are normal. PERRL. Head: Atraumatic. Nose: No congestion/rhinnorhea. Mouth/Throat: Mucous membranes are moist. Neck: No stridor Cardiovascular: Grossly normal heart sounds.  Good peripheral circulation. Respiratory: Increased respiratory effort.  Rales over bilateral lung fields no retractions. Gastrointestinal: Soft and nontender. No distention. Musculoskeletal: Bilateral AKAs Neurologic:  Normal speech and language. No gross focal neurologic deficits are appreciated. Skin:  Skin is warm and dry. No rash noted. Psychiatric: Mood and affect are normal. Speech and behavior are normal.  ____________________________________________   LABS (all labs ordered are listed, but only abnormal results are displayed)  Labs Reviewed  BASIC METABOLIC PANEL - Abnormal; Notable for the following components:      Result Value   Potassium 3.3 (*)    Chloride 95 (*)    Glucose, Bld 170 (*)    BUN 37 (*)    Creatinine, Ser 5.49 (*)    Calcium 8.2 (*)    GFR, Estimated 11 (*)    All other components within normal limits  CBC - Abnormal; Notable for the following components:   RBC 3.23 (*)    Hemoglobin 8.2 (*)     HCT 26.8 (*)    MCH 25.4 (*)    RDW 20.4 (*)    All other components within normal limits  BRAIN NATRIURETIC PEPTIDE - Abnormal; Notable for the following components:   B Natriuretic Peptide >4,500.0 (*)    All other components within normal limits  TROPONIN I (HIGH SENSITIVITY) - Abnormal; Notable for the following components:   Troponin I (High Sensitivity) 32 (*)    All other components within normal limits  RESP PANEL BY RT-PCR (FLU A&B, COVID) [id number]  TROPONIN I (HIGH SENSITIVITY)   ____________________________________________  EKG  ED ECG REPORT I, Naaman Plummer, the attending physician, personally viewed and interpreted this ECG.  Date: 05/12/2021 EKG Time: 1935 Rate: 64 Rhythm: normal sinus rhythm QRS Axis: normal Intervals: normal ST/T Wave abnormalities: normal Narrative Interpretation: no evidence of acute ischemia  ____________________________________________  RADIOLOGY  ED MD interpretation: 2 view chest x-ray shows cardiomegaly with interstitial edema and bibasilar atelectasis with stable right pleural effusion  Official radiology report(s): DG Chest 2 View  Result Date: 05/12/2021 CLINICAL DATA:  Intermittent shortness of breath. EXAM: CHEST - 2 VIEW COMPARISON:  May 12, 2021 (12:01 p.m.) FINDINGS: Stable diffusely increased interstitial lung markings are seen. Mild to moderate severity areas of atelectasis and/or infiltrate are noted within the bilateral lung bases, right greater than left. A small, stable right pleural effusion is noted. No pneumothorax is identified. The cardiac silhouette is mildly enlarged and unchanged in size. Radiopaque stents are again seen overlying the superior mediastinum and left axilla. Multiple right-sided rib deformities are seen. IMPRESSION: 1. Cardiomegaly with mild, stable interstitial edema. 2. Stable mild to moderate severity bibasilar atelectasis and/or infiltrate, right greater than left. 3. Small, stable right pleural  effusion. Electronically Signed   By: Virgina Norfolk M.D.   On: 05/12/2021 20:21   DG Chest 2 View  Result Date: 05/12/2021 CLINICAL DATA:  Cough and shortness of breath. EXAM: CHEST - 2 VIEW COMPARISON:  02/06/2021 FINDINGS: Vascular stents involving the left central venous structures. Right basilar chest densities are compatible  with pleural fluid and compressive atelectasis. Cannot exclude airspace disease or pneumonia at the right lung base. Slightly increased interstitial lung densities bilaterally. Heart size is upper limits of normal. IMPRESSION: 1. Right basilar chest densities are compatible with pleural fluid with atelectasis and consolidation. Cannot exclude right lower lobe pneumonia. 2. Interstitial pulmonary edema. 3. These results will be called to the ordering clinician or representative by the Radiologist Assistant, and communication documented in the PACS or [employer]. Electronically Signed   By: Markus Daft M.D.   On: 05/12/2021 15:59    ____________________________________________   PROCEDURES  Procedure(s) performed (including Critical Care):  .1-3 Lead EKG Interpretation  Date/Time: 05/12/2021 9:45 PM Performed by: Naaman Plummer, MD Authorized by: Naaman Plummer, MD     Interpretation: normal     ECG rate:  67   ECG rate assessment: normal     Rhythm: sinus rhythm     Ectopy: none     Conduction: normal     ____________________________________________   INITIAL IMPRESSION / ASSESSMENT AND PLAN / ED COURSE  As part of my medical decision making, I reviewed the following data within the electronic medical record, if available:  Nursing notes reviewed and incorporated, Labs reviewed, EKG interpreted, Old chart reviewed, Radiograph reviewed and Notes from prior ED visits reviewed and incorporated        + dyspnea -LE edema Denies Non adherence to medication regimen  Workup: ECG, CBC, BMP, Troponin, BNP, CXR Findings: EKG: No STEMI and no evidence  of Brugadas sign, delta wave, epsilon wave, significantly prolonged QTc, or malignant arrhythmia. BNP: >4500 CXR: Bilateral pulmonary edema with cardiomegaly Based on history, exam and findings, presentation most consistent with acute on chronic heart failure. Low suspicion for PNA, ACS, tamponade, aortic dissection. Interventions: Oxygen, dialysis  Disposition (ESRD, missed HD): Plan admission to medicine with renal consult for expedited HD.      ____________________________________________   FINAL CLINICAL IMPRESSION(S) / ED DIAGNOSES  Final diagnoses:  Acute pulmonary edema (HCC)  Shortness of breath  Other hypervolemia     ED Discharge Orders     None        Note:  This document was prepared using Dragon voice recognition software and may include unintentional dictation errors.    Naaman Plummer, MD 05/12/21 2145    Naaman Plummer, MD 05/12/21 2145

## 2021-05-12 NOTE — H&P (Addendum)
History and Physical    [name] MRN:[id number] DOB: 01/20/1961 DOA: 05/12/2021  PCP: [name], Donald E, MD  Patient coming from:  home  I have personally briefly reviewed patient's old medical records in [place] [facility]  Chief Complaint: sob /cough/ chest pain- pcp ordered cxr- refer to ed for abn cxr  HPI: [name] is a 59 y.o. male with medical history significant of  ESRD MWF,HTN, DMII,Anemia,CVA,SZ d/o who presents in referral from pcp office. Patient per chart has had interim history of increase weakness with falls at home. He also over the last few days has noted intermittent sob/chest pain and cough. Patient followed with pcp for these varied complaints and on evaluation was noted to have  CXR noting fluid overload and also concern for possible consolidation. Due to these findings and patient symptoms , patient was referred to ed. Currently wife at bedside and is concerned about patient failure to thrive and continued weight loss. She does note patient symptoms is similar to his prior episode of PNA.  ED Course: In ED vitals: Wt 63.5 kg  Afeb, bp 167/73, hr 67, sat 100% on ra  Labs: wbc 5.8, hgb 8 at baseline, plt 154 Glu 217,K3.3m co2 31,cr 5.69 at around baseline bnp>4500no prior to compare TSH:2.2 Cxr 1. Right basilar chest densities are compatible with pleural fluid with atelectasis and consolidation. Cannot exclude right lower lobe pneumonia. 2. Interstitial pulmonary edema. 3. These results will be called to the ordering clinician or representative by the Radiologist Assistant, and communication documented in the PACS or [facility] Dashboard. 7/26/ 1. Cardiomegaly with mild, stable interstitial edema. 2. Stable mild to moderate severity bibasilar atelectasis and/or infiltrate, right greater than left. 3. Small, stable right pleural effusion.   EKG:nsr LVH  Tx: CTX/zithromax,lr  Review of Systems: As per HPI otherwise 10 point review of systems negative.    Past Medical History:  Diagnosis Date   Acute metabolic encephalopathy 11/18/2020   Anemia    Crohn disease (HCC)    Diabetes mellitus without complication (HCC)    DVT of lower extremity (deep venous thrombosis) (HCC) 2016   Empyema (HCC) 05/20/2017   Encephalopathy 12/04/2017   Fall at home, initial encounter 09/12/2020   Hidradenitis suppurativa    Hypertension    ICH (intracerebral hemorrhage) (HCC)    Peritonitis (HCC) 04/21/2017   Pyogenic arthritis of knee (HCC) 02/04/2016   Renal disorder    Sepsis (HCC) 01/12/2018   Stroke (HCC)     Past Surgical History:  Procedure Laterality Date   A/V FISTULAGRAM Left 02/24/2021   Procedure: A/V FISTULAGRAM;  Surgeon: Schnier, Gregory G, MD;  Location: [facility];  Service: Cardiovascular;  Laterality: Left;   ABDOMINAL SURGERY     AMPUTATION FINGER Left 06/2019   PR AMPUTATION LONG FINGER/THUMB+FLAPS [facility]   ANGIOPLASTY Left    left fem-pop at [facility] 04-11-2018   BELOW KNEE LEG AMPUTATION Right 08/2017   [facility]   COLONOSCOPY     COLONOSCOPY WITH PROPOFOL N/A 10/28/2020   Procedure: COLONOSCOPY WITH PROPOFOL;  Surgeon: Vanga, Rohini Reddy, MD;  Location: [facility];  Service: Gastroenterology;  Laterality: N/A;   COLONOSCOPY WITH PROPOFOL N/A 11/21/2020   Procedure: COLONOSCOPY WITH PROPOFOL;  Surgeon: Wohl, Darren, MD;  Location: [facility];  Service: Endoscopy;  Laterality: N/A;   DIALYSIS/PERMA CATHETER INSERTION N/A 12/09/2017   Procedure: DIALYSIS/PERMA CATHETER INSERTION;  Surgeon: Schnier, Gregory G, MD;  Location: [facility];  Service: Cardiovascular;  Laterality: N/A;     DIALYSIS/PERMA CATHETER INSERTION N/A 12/12/2017   Procedure: DIALYSIS/PERMA CATHETER INSERTION;  Surgeon: Algernon Huxley, MD;  Location: [place] [facility];  Service: Cardiovascular;  Laterality: N/A;   DIALYSIS/PERMA CATHETER REMOVAL Left 12/09/2017   Procedure: DIALYSIS/PERMA CATHETER REMOVAL;  Surgeon: Katha Cabal, MD;  Location: [place] [facility];  Service: Cardiovascular;  Laterality: Left;   ESOPHAGOGASTRODUODENOSCOPY (EGD) WITH PROPOFOL N/A 11/20/2020   Procedure: ESOPHAGOGASTRODUODENOSCOPY (EGD) WITH PROPOFOL;  Surgeon: [name], MD;  Location: [facility];  Service: Endoscopy;  Laterality: N/A;   KNEE SURGERY Left 02/04/2016   [facility]   LEG AMPUTATION THROUGH LOWER TIBIA AND FIBULA Left 06/22/2018   [facility]   LOWER EXTREMITY ANGIOGRAPHY Right 08/08/2017   Procedure: Lower Extremity Angiography;  Surgeon: Algernon Huxley, MD;  Location: [place] [facility];  Service: Cardiovascular;  Laterality: Right;   LOWER EXTREMITY ANGIOGRAPHY Right 08/22/2017   Procedure: Lower Extremity Angiography;  Surgeon: Algernon Huxley, MD;  Location: [place] [facility];  Service: Cardiovascular;  Laterality: Right;   LOWER EXTREMITY INTERVENTION  08/08/2017   Procedure: LOWER EXTREMITY INTERVENTION;  Surgeon: Algernon Huxley, MD;  Location: [place] [facility];  Service: Cardiovascular;;   LOWER EXTREMITY INTERVENTION  08/22/2017   Procedure: LOWER EXTREMITY INTERVENTION;  Surgeon: Algernon Huxley, MD;  Location: [place] [facility];  Service: Cardiovascular;;     reports that he has quit smoking. He has never used smokeless tobacco. He reports current drug use. Drug: Marijuana. He reports that he does not drink alcohol.  Allergies  Allergen Reactions   Methotrexate Other (See Comments)    Blood count drops   Vancomycin Shortness Of Breath    Eyes watering, SOB, wheezing   Cefepime Other (See Comments)   Tape     Family History  Problem Relation Age of Onset   Irritable bowel syndrome Sister    Diabetes Sister    Heart disease Mother    Diabetes Mother    Heart disease Father    Rheumatic fever Father        as child   Psoriasis Brother    Arthritis Brother    Diabetes Sister    Diabetes Sister    Prior to Admission medications   Medication Sig Start Date End Date Taking? Authorizing Provider  acetaminophen (TYLENOL) 500 MG  tablet Take 1,000 mg by mouth daily as needed for moderate pain or headache.     [provider]  Adalimumab ([name] PEN) 40 MG/0.4ML PNKT Inject 40 mg into the muscle once a week.  11/27/18   [provider]  Alcohol Swabs PADS Use as directed to check blood sugar three times daily for insulin dependent type 2 diabetes. 10/20/17   [name], MD  amLODipine (NORVASC) 10 MG tablet TAKE 1 TABLET(10 MG) BY MOUTH DAILY AS NEEDED 03/02/21   [name], MD  atorvastatin (LIPITOR) 80 MG tablet TAKE 1 TABLET(80 MG) BY MOUTH DAILY Patient taking differently: Take 80 mg by mouth daily. 06/25/20   [name], MD  AURYXIA 1 GM 210 MG(Fe) tablet Take 420 mg by mouth in the morning and at bedtime.  10/23/18   [provider]  Blood Glucose Monitoring Suppl (ONE TOUCH ULTRA 2) w/Device KIT Use as directed to check blood sugar three times daily. E11.9 02/20/18   [name], MD  carvedilol (COREG) 25 MG tablet Take 1 tablet (25 mg total) by mouth 2 (two) times daily. 06/27/20   [name],  MD  cloNIDine (CATAPRES) 0.1 MG tablet Take 1 tablet (0.1 mg total) by mouth 2 (two) times daily. 02/10/21 03/12/21  Shah, Vipul, MD  cyanocobalamin (,VITAMIN B-12,) 1000 MCG/ML injection Inject 1ml once a week for 4 weeks and then once a month for 3 months 09/23/20   Vanga, Rohini Reddy, MD  cyproheptadine (PERIACTIN) 4 MG tablet Take 1 tablet (4 mg total) by mouth 2 (two) times daily. Before meals 02/17/21   [name], Donald E, MD  furosemide (LASIX) 80 MG tablet Take 1 tablet (80 mg total) by mouth 2 (two) times daily. 03/10/21   [name], Donald E, MD  gabapentin (NEURONTIN) 100 MG capsule Take 100 mg by mouth at bedtime. Take along with 300mg capsule at bedtime    [provider]  gabapentin (NEURONTIN) 300 MG capsule Take 1 capsule (300 mg total) by mouth at bedtime. 04/01/21 06/25/22  [name], Donald E, MD  hydrALAZINE (APRESOLINE) 50 MG tablet Take 1 tablet (50 mg total) by mouth  every 8 (eight) hours. 01/20/21   [name], Donald E, MD  levETIRAcetam (KEPPRA) 500 MG tablet TAKE 1 TABLET BY MOUTH DAILY AND 1 TABLET BY MOUTH EVERY MONDAY, WEDNESDAY AND FRIDAY AFTER DIALYSIS 05/04/21   [name], Donald E, MD  lisinopril (ZESTRIL) 40 MG tablet Take 40 mg by mouth daily. 12/17/20   [provider]  sevelamer carbonate (RENVELA) 800 MG tablet TAKE 1 TABLET(800 MG) BY MOUTH THREE TIMES DAILY Patient taking differently: Take 800 mg by mouth 3 (three) times daily with meals. 09/22/20   [name], Donald E, MD  spironolactone (ALDACTONE) 25 MG tablet Take 1 tablet by mouth at bedtime. 12/17/20 12/17/21  [provider]    Physical Exam: Vitals:   05/12/21 1924 05/12/21 1930 05/12/21 2103  BP:  (!) 165/68 (!) 186/81  Pulse:  65 68  Resp:  20 20  Temp:  98.4 F (36.9 C)   TempSrc:  Oral   SpO2:  100% 97%  Weight: 63.5 kg    Height: 5' 11" (1.803 m)       Vitals:   05/12/21 1924 05/12/21 1930 05/12/21 2103  BP:  (!) 165/68 (!) 186/81  Pulse:  65 68  Resp:  20 20  Temp:  98.4 F (36.9 C)   TempSrc:  Oral   SpO2:  100% 97%  Weight: 63.5 kg    Height: 5' 11" (1.803 m)    Constitutional: NAD, calm, comfortable, cachetic Eyes: PERRL, lids and conjunctivae normal ENMT: Mucous membranes are moist. Posterior pharynx clear of any exudate or lesions.Normal dentition.  Neck: normal, supple, no masses, no thyromegaly Respiratory: clear to auscultation bilaterally, no wheezing, no crackles. Normal respiratory effort. No accessory muscle use.  Cardiovascular: Regular rate and rhythm, no murmurs / rubs / gallops. No extremity edema. 2+ pedal pulses. No carotid bruits.  Abdomen: no tenderness, no masses palpated. No hepatosplenomegaly. Bowel sounds positive.  Musculoskeletal: no clubbing / cyanosis. No joint deformity upper and lower extremities. Good ROM, no contractures. Normal muscle tone.  Skin: +follicular  rash on legs, No induration Neurologic: CN 2-12 grossly intact.  Sensation intact, DTR normal. Strength 5/5 in all 4.  Psychiatric: Normal judgment and insight. Alert and oriented x 3. Normal mood.    Labs on Admission: I have personally reviewed following labs and imaging studies  CBC: Recent Labs  Lab 05/11/21 1628 05/12/21 1928  WBC 5.8 6.2  HGB 8.0* 8.2*  HCT 25.8* 26.8*  MCV 79 83.0  PLT 136* 154   Basic   Metabolic Panel: Recent Labs  Lab 05/11/21 1628 05/12/21 1928  NA 140 136  K 3.3* 3.3*  CL 96 95*  CO2 31* 30  GLUCOSE 217* 170*  BUN 22 37*  CREATININE 3.63* 5.49*  CALCIUM 8.2* 8.2*   GFR: Estimated Creatinine Clearance: 13.2 mL/min (A) (by C-G formula based on SCr of 5.49 mg/dL (H)). Liver Function Tests: Recent Labs  Lab 05/11/21 1628  AST 14  ALT 4  ALKPHOS 79  BILITOT 0.5  PROT 7.0  ALBUMIN 3.3*   No results for input(s): LIPASE, AMYLASE in the last 168 hours. No results for input(s): AMMONIA in the last 168 hours. Coagulation Profile: No results for input(s): INR, PROTIME in the last 168 hours. Cardiac Enzymes: No results for input(s): CKTOTAL, CKMB, CKMBINDEX, TROPONINI in the last 168 hours. BNP (last 3 results) No results for input(s): PROBNP in the last 8760 hours. HbA1C: No results for input(s): HGBA1C in the last 72 hours. CBG: No results for input(s): GLUCAP in the last 168 hours. Lipid Profile: No results for input(s): CHOL, HDL, LDLCALC, TRIG, CHOLHDL, LDLDIRECT in the last 72 hours. Thyroid Function Tests: Recent Labs    05/11/21 1628  TSH 2.280   Anemia Panel: No results for input(s): VITAMINB12, FOLATE, FERRITIN, TIBC, IRON, RETICCTPCT in the last 72 hours. Urine analysis:    Component Value Date/Time   COLORURINE YELLOW (A) 02/06/2021 2041   APPEARANCEUR CLOUDY (A) 02/06/2021 2041   LABSPEC 1.024 02/06/2021 2041   PHURINE 7.0 02/06/2021 2041   GLUCOSEU 150 (A) 02/06/2021 2041   HGBUR MODERATE (A) 02/06/2021 2041   BILIRUBINUR NEGATIVE 02/06/2021 2041   KETONESUR NEGATIVE  02/06/2021 2041   PROTEINUR >=300 (A) 02/06/2021 2041   NITRITE NEGATIVE 02/06/2021 2041   LEUKOCYTESUR NEGATIVE 02/06/2021 2041    Radiological Exams on Admission: DG Chest 2 View  Result Date: 05/12/2021 CLINICAL DATA:  Intermittent shortness of breath. EXAM: CHEST - 2 VIEW COMPARISON:  May 12, 2021 (12:01 p.m.) FINDINGS: Stable diffusely increased interstitial lung markings are seen. Mild to moderate severity areas of atelectasis and/or infiltrate are noted within the bilateral lung bases, right greater than left. A small, stable right pleural effusion is noted. No pneumothorax is identified. The cardiac silhouette is mildly enlarged and unchanged in size. Radiopaque stents are again seen overlying the superior mediastinum and left axilla. Multiple right-sided rib deformities are seen. IMPRESSION: 1. Cardiomegaly with mild, stable interstitial edema. 2. Stable mild to moderate severity bibasilar atelectasis and/or infiltrate, right greater than left. 3. Small, stable right pleural effusion. Electronically Signed   By: Thaddeus  Houston M.D.   On: 05/12/2021 20:21   DG Chest 2 View  Result Date: 05/12/2021 CLINICAL DATA:  Cough and shortness of breath. EXAM: CHEST - 2 VIEW COMPARISON:  02/06/2021 FINDINGS: Vascular stents involving the left central venous structures. Right basilar chest densities are compatible with pleural fluid and compressive atelectasis. Cannot exclude airspace disease or pneumonia at the right lung base. Slightly increased interstitial lung densities bilaterally. Heart size is upper limits of normal. IMPRESSION: 1. Right basilar chest densities are compatible with pleural fluid with atelectasis and consolidation. Cannot exclude right lower lobe pneumonia. 2. Interstitial pulmonary edema. 3. These results will be called to the ordering clinician or representative by the Radiologist Assistant, and communication documented in the PACS or [facility] Dashboard. Electronically Signed    By: Adam  Henn M.D.   On: 05/12/2021 15:59    EKG: Independently reviewed.see above  Assessment/Plan  Fluid overload due to ESRD/Exacerbation   of Chronic systolic heart failure -patient missed HD session on Monday  -EF 40-45% 11/21 -case discussed with Dr Holley Raring by ED MD -plans for HD in am  -electrolytes stable , mild hypokalemia at 3.3 -continue carvediolol,lasix,hydralazine Spironolactone  CAP -place on CAP protocol  -continue with CTX/Azithromcyin  -de-escalate abx as able  -rec repeat cxr s/p ESRD  -CT thorax for better lung evaluation  Follicular rash  -on leg ? Due to prosthesis -supportive care for now, appears to be healing   HTN -stable continue norvasc,clonidine, HLD -continue statin   Hx of ICH -continue on keppra    DMII -check a1c  -place on glucose surveillance   Sz d/o  -continue keppra   Hidradenitis suppurativa  - [name]  Anemia -hx of gi bleed -stable    Med rec to be completed as able DVT prophylaxis: scd Code Status: full Family Communication: n/a Disposition Plan: patient  expected to be admitted greater than 2 midnights Consults called:  Renal Lateef  Admission status: inpatient   Clance Boll MD [facility]  If 7PM-7AM, please contact night-coverage [email or web address] Password [email or web address]  05/12/2021, 10:24 PM

## 2021-05-12 NOTE — Consult Note (Signed)
Brief Pharmacy Note:  Medication history reviewed per consult - pt has had Ceftriaxone and Cefazolin multiple times without documented incident - IV abx changed from Levaquin to Ceftriaxone and Azithromycin  Lu Duffel, PharmD, BCPS Clinical Pharmacist 05/12/2021 11:18 PM

## 2021-05-12 NOTE — Telephone Encounter (Signed)
I have advised patient's wife of this. FYI.

## 2021-05-13 ENCOUNTER — Inpatient Hospital Stay: Payer: Medicare Other

## 2021-05-13 ENCOUNTER — Inpatient Hospital Stay (HOSPITAL_COMMUNITY)
Admit: 2021-05-13 | Discharge: 2021-05-13 | Disposition: A | Discharge status: Home / Self Care | Payer: Medicare Other | Attending: Internal Medicine | Admitting: Internal Medicine

## 2021-05-13 DIAGNOSIS — I5021 Acute systolic (congestive) heart failure: Secondary | ICD-10-CM

## 2021-05-13 DIAGNOSIS — J810 Acute pulmonary edema: Secondary | ICD-10-CM | POA: Insufficient documentation

## 2021-05-13 DIAGNOSIS — E8779 Other fluid overload: Secondary | ICD-10-CM | POA: Diagnosis not present

## 2021-05-13 DIAGNOSIS — R0602 Shortness of breath: Secondary | ICD-10-CM | POA: Insufficient documentation

## 2021-05-13 LAB — ECHOCARDIOGRAM COMPLETE
AR max vel: 1.86 cm2
AV Area VTI: 2.06 cm2
AV Area mean vel: 2.15 cm2
AV Mean grad: 3 mmHg
AV Peak grad: 5.9 mmHg
Ao pk vel: 1.21 m/s
Area-P 1/2: 4.04 cm2
Calc EF: 46.8 %
Height: 71 in
S' Lateral: 4.6 cm
Single Plane A2C EF: 42.6 %
Single Plane A4C EF: 53.4 %
Weight: 2240 oz

## 2021-05-13 LAB — MRSA NEXT GEN BY PCR, NASAL: MRSA by PCR Next Gen: NOT DETECTED

## 2021-05-13 LAB — GLUCOSE, CAPILLARY
Glucose-Capillary: 123 mg/dL — ABNORMAL HIGH (ref 70–99)
Glucose-Capillary: 157 mg/dL — ABNORMAL HIGH (ref 70–99)

## 2021-05-13 LAB — PROCALCITONIN: Procalcitonin: 0.75 ng/mL

## 2021-05-13 MED ORDER — ATORVASTATIN CALCIUM 80 MG PO TABS
80.0000 mg | ORAL_TABLET | Freq: Every day | ORAL | Status: DC
  Administered 2021-05-13 – 2021-05-15 (×3): 80 mg via ORAL
  Filled 2021-05-13 (×2): qty 1
  Filled 2021-05-13: qty 4

## 2021-05-13 MED ORDER — AMLODIPINE BESYLATE 10 MG PO TABS
10.0000 mg | ORAL_TABLET | Freq: Every day | ORAL | Status: DC
  Administered 2021-05-13 – 2021-05-15 (×3): 10 mg via ORAL
  Filled 2021-05-13: qty 2
  Filled 2021-05-13 (×2): qty 1

## 2021-05-13 MED ORDER — FERRIC CITRATE 1 GM 210 MG(FE) PO TABS
420.0000 mg | ORAL_TABLET | Freq: Two times a day (BID) | ORAL | Status: DC
  Administered 2021-05-13 – 2021-05-15 (×4): 420 mg via ORAL
  Filled 2021-05-13 (×7): qty 2

## 2021-05-13 MED ORDER — ACETAMINOPHEN 325 MG PO TABS
ORAL_TABLET | ORAL | Status: AC
  Filled 2021-05-13: qty 2

## 2021-05-13 MED ORDER — HEPARIN SODIUM (PORCINE) 5000 UNIT/ML IJ SOLN
5000.0000 [IU] | Freq: Three times a day (TID) | INTRAMUSCULAR | Status: DC
  Administered 2021-05-13 – 2021-05-15 (×6): 5000 [IU] via SUBCUTANEOUS
  Filled 2021-05-13 (×6): qty 1

## 2021-05-13 MED ORDER — LISINOPRIL 20 MG PO TABS
40.0000 mg | ORAL_TABLET | Freq: Every day | ORAL | Status: DC
  Administered 2021-05-13 – 2021-05-15 (×3): 40 mg via ORAL
  Filled 2021-05-13: qty 4
  Filled 2021-05-13 (×2): qty 2

## 2021-05-13 MED ORDER — ACETAMINOPHEN 325 MG PO TABS
650.0000 mg | ORAL_TABLET | Freq: Once | ORAL | Status: AC
  Administered 2021-05-13: 650 mg via ORAL

## 2021-05-13 MED ORDER — SEVELAMER CARBONATE 800 MG PO TABS
800.0000 mg | ORAL_TABLET | Freq: Three times a day (TID) | ORAL | Status: DC
  Administered 2021-05-13 – 2021-05-14 (×2): 800 mg via ORAL
  Filled 2021-05-13 (×5): qty 1

## 2021-05-13 MED ORDER — ASPIRIN 81 MG PO CHEW
81.0000 mg | CHEWABLE_TABLET | Freq: Every day | ORAL | Status: DC
  Administered 2021-05-13 – 2021-05-15 (×3): 81 mg via ORAL
  Filled 2021-05-13 (×3): qty 1

## 2021-05-13 MED ORDER — LEVETIRACETAM 500 MG PO TABS
500.0000 mg | ORAL_TABLET | Freq: Every day | ORAL | Status: DC
  Administered 2021-05-14 – 2021-05-15 (×2): 500 mg via ORAL
  Filled 2021-05-13 (×2): qty 1

## 2021-05-13 MED ORDER — INSULIN ASPART 100 UNIT/ML IJ SOLN
0.0000 [IU] | Freq: Three times a day (TID) | INTRAMUSCULAR | Status: DC
  Administered 2021-05-14 – 2021-05-15 (×2): 1 [IU] via SUBCUTANEOUS
  Filled 2021-05-13 (×2): qty 1

## 2021-05-13 MED ORDER — HYDRALAZINE HCL 50 MG PO TABS
50.0000 mg | ORAL_TABLET | Freq: Three times a day (TID) | ORAL | Status: DC
  Administered 2021-05-13 – 2021-05-15 (×6): 50 mg via ORAL
  Filled 2021-05-13 (×6): qty 1

## 2021-05-13 MED ORDER — CHLORHEXIDINE GLUCONATE CLOTH 2 % EX PADS
6.0000 | MEDICATED_PAD | Freq: Every day | CUTANEOUS | Status: DC
  Administered 2021-05-14 – 2021-05-15 (×2): 6 via TOPICAL
  Filled 2021-05-13 (×2): qty 6

## 2021-05-13 MED ORDER — CLONIDINE HCL 0.1 MG PO TABS
0.1000 mg | ORAL_TABLET | Freq: Two times a day (BID) | ORAL | Status: DC
  Administered 2021-05-13 – 2021-05-14 (×3): 0.1 mg via ORAL
  Filled 2021-05-13 (×3): qty 1

## 2021-05-13 MED ORDER — GABAPENTIN 100 MG PO CAPS
100.0000 mg | ORAL_CAPSULE | Freq: Every day | ORAL | Status: DC
  Administered 2021-05-13 – 2021-05-14 (×2): 100 mg via ORAL
  Filled 2021-05-13 (×2): qty 1

## 2021-05-13 MED ORDER — SPIRONOLACTONE 25 MG PO TABS
25.0000 mg | ORAL_TABLET | Freq: Every day | ORAL | Status: DC
  Administered 2021-05-13 – 2021-05-15 (×3): 25 mg via ORAL
  Filled 2021-05-13 (×3): qty 1

## 2021-05-13 MED ORDER — INSULIN ASPART 100 UNIT/ML IJ SOLN: 0.0000 [IU] | Freq: Every day | INTRAMUSCULAR | Status: DC

## 2021-05-13 MED ORDER — CARVEDILOL 25 MG PO TABS
25.0000 mg | ORAL_TABLET | Freq: Two times a day (BID) | ORAL | Status: DC
  Administered 2021-05-13 – 2021-05-15 (×5): 25 mg via ORAL
  Filled 2021-05-13 (×2): qty 1
  Filled 2021-05-13: qty 4
  Filled 2021-05-13 (×2): qty 1

## 2021-05-13 NOTE — Progress Notes (Signed)
*  PRELIMINARY RESULTS* Echocardiogram 2D Echocardiogram has been performed.  Sherrie Sport 05/13/2021, 12:03 PM

## 2021-05-13 NOTE — Progress Notes (Signed)
This patient came from ED30, orders to dialyze for 3 hours for a uF of 1L net max. Patient tolerated the tx and has been sent to deisgnated room 244 after giving report to the unit RN. Vital signs stable post hd and hemostasis has been achieved.

## 2021-05-13 NOTE — ED Notes (Addendum)
Admitting provider at bedside. Verbally discussed with her pt spouse is inquiring about home medications. Left note for pharmacy tech to request med rec per MD request. Discussed with provider I was only able to obtain 1 set of blood cultures and that the pt request tylenol for pain. Pt given additional pillows, blanket, and sandwich tray as requested. Pt also made aware that we need a urine sample, pt says that he only urinates very small amounts.

## 2021-05-13 NOTE — Progress Notes (Signed)
PROGRESS NOTE    [name]  PQZ:[id number] DOB: 11/27/1960 DOA: 05/12/2021 PCP: [name], MD   Brief Narrative: Taken from H&P. [name] is a 60 y.o. male with medical history significant of  ESRD MWF,HTN, DMII,Anemia,CVA, seizure disorder who presents in referral from pcp office. Patient per chart has had interim history of increase weakness with falls at home. He also over the last few days has noted intermittent sob/chest pain and cough. Patient followed with pcp for these varied complaints and on evaluation was noted to have  CXR noting fluid overload and also concern for possible consolidation. Due to these findings and patient symptoms , patient was referred to ED.  Patient had her last dialysis on Friday, missed on Monday.  BNP markedly elevated.  No leukocytosis, afebrile.  Subjective: Patient was complaining of generalized weakness and cough for the past 2 weeks.  Per patient his appetite is is not good.  Denies any fever or chills.  No orthopnea or PND.  He chronically uses 2 pillows to sleep and no recent change.  Patient made minimum amount of urine at baseline.  Missed 1 dialysis on Monday.  Assessment & Plan:   Active Problems:   Fluid overload  Acute on chronic HFrEF secondary to missed dialysis. Patient with generalized weakness and missed 1 dialysis.  BNP markedly elevated above 4500.  Chest x-ray with vascular congestion and possible infiltrate.  CT chest without contrast was inconclusive and they were recommending repeat CT chest with contrast for follow-up if needed. Patient was started on Lasix infusion-minimally made urine.  No urine output since in the hospital. -Stop Lasix infusion. -Volume will be managed with dialysis. -Nephrology is on board, going for dialysis today. -Repeat echocardiogram done-pending results. -Continue with home dose of carvedilol and spironolactone.  ESRD.  Nephrology is on board. -Continue with scheduled  dialysis.  Questionable infiltrate on chest imaging.  Patient with no leukocytosis or fever.  Doubt he has any pneumonia.  He was started on CAP coverage with ceftriaxone and azithromycin. -Repeat chest x-ray after dialysis -Check procalcitonin-if negative we will stop antibiotics.  Hypertension.  Blood pressure elevated. On multiple antihypertensives at home-doubt about compliance. -Continue home medications and monitor.  Hyperlipidemia. -Continue statin.  Seizure disorder.  No acute concern -Continue with Keppra.  Type 2 diabetes mellitus.  CBG within goal.  A1c elevated at 8.2.  Patient was not on any antidiabetic medications at home. -Sensitive/renal SSI  History of anemia.  Most likely secondary to renal disease.  Also has an history of GI bleed.  Hemoglobin stable. -Continue with home dose of Auryxia.   Objective: Vitals:   05/13/21 1300 05/13/21 1315 05/13/21 1330 05/13/21 1345  BP: (!) 169/106 (!) 161/84 (!) 179/78 (!) 170/73  Pulse:   71 72  Resp:   (!) 22 20  Temp:      TempSrc:      SpO2:  97% 96% 98%  Weight:      Height:       No intake or output data in the 24 hours ending 05/13/21 1410 Filed Weights   05/12/21 1924  Weight: 63.5 kg    Examination:  General exam: Appears calm and comfortable  Respiratory system: Bilateral basal crackles. Respiratory effort normal. Cardiovascular system: S1 & S2 heard, RRR.  Gastrointestinal system: Soft, nontender, nondistended, bowel sounds positive. Central nervous system: Alert and oriented. No focal neurological deficits.Symmetric 5 x 5 power. Extremities: Bilateral BKA, no edema. Psychiatry: Judgement and insight appear  normal.   DVT prophylaxis: Heparin Code Status: Full Family Communication: Discussed with patient Disposition Plan:  Status is: Inpatient  Remains inpatient appropriate because:Inpatient level of care appropriate due to severity of illness  Dispo: The patient is from: Home               Anticipated d/c is to: Home              Patient currently is not medically stable to d/c.   Difficult to place patient No              Level of care: Progressive Cardiac  All the records are reviewed and case discussed with Care Management/Social Worker. Management plans discussed with the patient, nursing and they are in agreement.  Consultants:  Nephrology  Procedures:  Antimicrobials:  Ceftriaxone Zithromax  Data Reviewed: I have personally reviewed following labs and imaging studies  CBC: Recent Labs  Lab 05/11/21 1628 05/12/21 1928  WBC 5.8 6.2  HGB 8.0* 8.2*  HCT 25.8* 26.8*  MCV 79 83.0  PLT 136* 354   Basic Metabolic Panel: Recent Labs  Lab 05/11/21 1628 05/12/21 1928  NA 140 136  K 3.3* 3.3*  CL 96 95*  CO2 31* 30  GLUCOSE 217* 170*  BUN 22 37*  CREATININE 3.63* 5.49*  CALCIUM 8.2* 8.2*   GFR: Estimated Creatinine Clearance: 13.2 mL/min (A) (by C-G formula based on SCr of 5.49 mg/dL (H)). Liver Function Tests: Recent Labs  Lab 05/11/21 1628  AST 14  ALT 4  ALKPHOS 79  BILITOT 0.5  PROT 7.0  ALBUMIN 3.3*   No results for input(s): LIPASE, AMYLASE in the last 168 hours. No results for input(s): AMMONIA in the last 168 hours. Coagulation Profile: No results for input(s): INR, PROTIME in the last 168 hours. Cardiac Enzymes: No results for input(s): CKTOTAL, CKMB, CKMBINDEX, TROPONINI in the last 168 hours. BNP (last 3 results) No results for input(s): PROBNP in the last 8760 hours. HbA1C: No results for input(s): HGBA1C in the last 72 hours. CBG: No results for input(s): GLUCAP in the last 168 hours. Lipid Profile: No results for input(s): CHOL, HDL, LDLCALC, TRIG, CHOLHDL, LDLDIRECT in the last 72 hours. Thyroid Function Tests: Recent Labs    05/11/21 1628  TSH 2.280   Anemia Panel: No results for input(s): VITAMINB12, FOLATE, FERRITIN, TIBC, IRON, RETICCTPCT in the last 72 hours. Sepsis Labs: No results for input(s): PROCALCITON,  LATICACIDVEN in the last 168 hours.  Recent Results (from the past 240 hour(s))  Resp Panel by RT-PCR (Flu A&B, Covid) Nasopharyngeal Swab     Status: None   Collection Time: 05/12/21  9:03 PM   Specimen: Nasopharyngeal Swab; Nasopharyngeal(NP) swabs in vial transport medium  Result Value Ref Range Status   SARS Coronavirus 2 by RT PCR NEGATIVE NEGATIVE Final    Comment: (NOTE) SARS-CoV-2 target nucleic acids are NOT DETECTED.  The SARS-CoV-2 RNA is generally detectable in upper respiratory specimens during the acute phase of infection. The lowest concentration of SARS-CoV-2 viral copies this assay can detect is 138 copies/mL. A negative result does not preclude SARS-Cov-2 infection and should not be used as the sole basis for treatment or other patient management decisions. A negative result may occur with  improper specimen collection/handling, submission of specimen other than nasopharyngeal swab, presence of viral mutation(s) within the areas targeted by this assay, and inadequate number of viral copies(<138 copies/mL). A negative result must be combined with clinical observations, patient history, and epidemiological  information. The expected result is Negative.  Fact Sheet for Patients:  EntrepreneurPulse.com.au  Fact Sheet for Healthcare Providers:  IncredibleEmployment.be  This test is no t yet approved or cleared by the [place] FDA and  has been authorized for detection and/or diagnosis of SARS-CoV-2 by FDA under an Emergency Use Authorization (EUA). This EUA will remain  in effect (meaning this test can be used) for the duration of the COVID-19 declaration under Section 564(b)(1) of the Act, 21 U.S.C.section 360bbb-3(b)(1), unless the authorization is terminated  or revoked sooner.       Influenza A by PCR NEGATIVE NEGATIVE Final   Influenza B by PCR NEGATIVE NEGATIVE Final    Comment: (NOTE) The Xpert Xpress  SARS-CoV-2/FLU/RSV plus assay is intended as an aid in the diagnosis of influenza from Nasopharyngeal swab specimens and should not be used as a sole basis for treatment. Nasal washings and aspirates are unacceptable for Xpert Xpress SARS-CoV-2/FLU/RSV testing.  Fact Sheet for Patients: EntrepreneurPulse.com.au  Fact Sheet for Healthcare Providers: IncredibleEmployment.be  This test is not yet approved or cleared by the [place] FDA and has been authorized for detection and/or diagnosis of SARS-CoV-2 by FDA under an Emergency Use Authorization (EUA). This EUA will remain in effect (meaning this test can be used) for the duration of the COVID-19 declaration under Section 564(b)(1) of the Act, 21 U.S.C. section 360bbb-3(b)(1), unless the authorization is terminated or revoked.  Performed at [facility], [address] [place], [place] [address]   MRSA Next Gen by PCR, Nasal     Status: None   Collection Time: 05/12/21 11:06 PM   Specimen: Nasal Mucosa; Nasal Swab  Result Value Ref Range Status   MRSA by PCR Next Gen NOT DETECTED NOT DETECTED Final    Comment: (NOTE) The GeneXpert MRSA Assay (FDA approved for NASAL specimens only), is one component of a comprehensive MRSA colonization surveillance program. It is not intended to diagnose MRSA infection nor to guide or monitor treatment for MRSA infections. Test performance is not FDA approved in patients less than 44 years old. Performed at [facility], [address] [place], [place] [address]   Culture, blood (routine x 2) Call MD if unable to obtain prior to antibiotics being given     Status: None (Preliminary result)   Collection Time: 05/12/21 11:54 PM   Specimen: BLOOD  Result Value Ref Range Status   Specimen Description BLOOD RIGHT FOREARM  Final   Special Requests   Final    BOTTLES DRAWN AEROBIC ONLY Blood Culture results may not be optimal due to an  inadequate volume of blood received in culture bottles   Culture   Final    NO GROWTH < 12 HOURS Performed at [facility], [address], [place], [place] [address]    Report Status PENDING  Incomplete     Radiology Studies: DG Chest 2 View  Result Date: 05/12/2021 CLINICAL DATA:  Intermittent shortness of breath. EXAM: CHEST - 2 VIEW COMPARISON:  May 12, 2021 (12:01 p.m.) FINDINGS: Stable diffusely increased interstitial lung markings are seen. Mild to moderate severity areas of atelectasis and/or infiltrate are noted within the bilateral lung bases, right greater than left. A small, stable right pleural effusion is noted. No pneumothorax is identified. The cardiac silhouette is mildly enlarged and unchanged in size. Radiopaque stents are again seen overlying the superior mediastinum and left axilla. Multiple right-sided rib deformities are seen. IMPRESSION: 1. Cardiomegaly with mild, stable interstitial edema. 2.  Stable mild to moderate severity bibasilar atelectasis and/or infiltrate, right greater than left. 3. Small, stable right pleural effusion. Electronically Signed   By: Virgina Norfolk M.D.   On: 05/12/2021 20:21   DG Chest 2 View  Result Date: 05/12/2021 CLINICAL DATA:  Cough and shortness of breath. EXAM: CHEST - 2 VIEW COMPARISON:  02/06/2021 FINDINGS: Vascular stents involving the left central venous structures. Right basilar chest densities are compatible with pleural fluid and compressive atelectasis. Cannot exclude airspace disease or pneumonia at the right lung base. Slightly increased interstitial lung densities bilaterally. Heart size is upper limits of normal. IMPRESSION: 1. Right basilar chest densities are compatible with pleural fluid with atelectasis and consolidation. Cannot exclude right lower lobe pneumonia. 2. Interstitial pulmonary edema. 3. These results will be called to the ordering clinician or representative by the Radiologist Assistant, and  communication documented in the PACS or [employer]. Electronically Signed   By: Markus Daft M.D.   On: 05/12/2021 15:59   CT CHEST WO CONTRAST  Result Date: 05/13/2021 CLINICAL DATA:  Abnormal chest x-ray EXAM: CT CHEST WITHOUT CONTRAST TECHNIQUE: Multidetector CT imaging of the chest was performed following the standard protocol without IV contrast. COMPARISON:  Prior CT scan of the chest 05/19/2017 FINDINGS: Cardiovascular: Limited evaluation in the absence of intravenous contrast. Metallic stents are present in the left innominate vein and left axillary vein. The main pulmonary artery is enlarged at nearly form cm in diameter. The aorta is normal in caliber. Atherosclerotic calcifications noted in the aorta, branch arteries and throughout the coronary arteries. Marked cardiomegaly. The intracardiac blood pool is hypodense relative to the adjacent myocardium consistent with anemia. No pericardial effusion. Mediastinum/Nodes: Limited evaluation in the absence of intravenous contrast and asthenic neck body habitus with minimal fat planes to separate the soft tissue density structures of the mediastinum. Left hilar lymph node measures 1 cm in short axis slightly larger when compared with prior imaging. Ill-defined soft tissue thickening throughout the right paratracheal and right perihilar space very poorly evaluated without intravenous contrast. Correlation with prior CT imaging demonstrates evidence of prior mediastinal abscess in these locations. This may represent residual scarring. Lungs/Pleura: Interlobular septal thickening. Diffuse bronchial wall thickening throughout both lungs. Debris present within the distal trachea and right mainstem bronchus likely representing secretions. Ground-glass attenuation airspace opacities with areas of more confluence noted in the right perihilar lung and extending along the Peri bronchovascular tree. Small right pleural effusion with thickened and calcified pleural  rind. Upper Abdomen: Severe calcifications of visualized intra-abdominal vessels. Bilateral renal atrophy. No acute abnormality. Musculoskeletal: No acute fracture or aggressive appearing lytic or blastic osseous lesion. IMPRESSION: 1. Very limited evaluation in the absence of intravenous contrast, particularly given patient's very thin body habitus. Without fat planes to separate the similar density soft tissue structures within the mediastinum resolution is quite limited. 2. Cardiomegaly with pulmonary edema most consistent with CHF. 3. Changes are asymmetric and more impressive in the right perihilar region. In the appropriate clinical setting, a superimposed bronchopneumonia is a consideration. Malignancy is also possible, but considered less likely. Consider repeat CT scan of the chest with intravenous contrast after treatment of patient's presumed CHF/volume overload. 4. Small volume of secretions present within the distal trachea and right mainstem bronchus. 5. Probable chronic ill-defined soft tissue thickening within the right paratracheal mediastinum and right perihilar mediastinum may be secondary to prior mediastinal abscesses as seen on prior imaging from August of 2018. 6. Similarly, chronic small right-sided pleural effusion with  thickened and calcified pleural rind is also likely residua from the event in 2018. 7. Pulmonary arterial enlargement suggests underlying pulmonary arterial hypertension. 8. The intracardiac blood pool is hypodense relative to the adjacent myocardium consistent with anemia. 9. Aortic and advanced coronary artery atherosclerotic calcifications. 10. Metallic stents in the left brachiocephalic and axillary veins likely related to hemodialysis access. Aortic Atherosclerosis (ICD10-I70.0). Electronically Signed   By: Jacqulynn Cadet M.D.   On: 05/13/2021 06:29    Scheduled Meds:  acetaminophen       amLODipine  10 mg Oral Daily   aspirin  81 mg Oral Daily   atorvastatin   80 mg Oral Daily   carvedilol  25 mg Oral BID   Chlorhexidine Gluconate Cloth  6 each Topical Q0600   cloNIDine  0.1 mg Oral BID   ferric citrate  420 mg Oral BID WC   gabapentin  100 mg Oral QHS   hydrALAZINE  50 mg Oral Q8H   levETIRAcetam  500 mg Oral Q M,W,F   [START ON 05/14/2021] levETIRAcetam  500 mg Oral Daily   lisinopril  40 mg Oral Daily   sevelamer carbonate  800 mg Oral TID WC   spironolactone  25 mg Oral Daily   Continuous Infusions:  azithromycin Stopped (05/13/21 0256)   cefTRIAXone (ROCEPHIN)  IV Stopped (05/13/21 0509)     LOS: 1 day   Time spent: 45 minutes. More than 50% of the time was spent in counseling/coordination of care  Lorella Nimrod, MD [facility]  If 7PM-7AM, please contact night-coverage [email or web address]  05/13/2021, 2:10 PM   This record has been created using Systems analyst. Errors have been sought and corrected,but may not always be located. Such creation errors do not reflect on the standard of care.

## 2021-05-13 NOTE — ED Notes (Signed)
Kayla RN aware of assigned bed

## 2021-05-13 NOTE — Consult Note (Signed)
ANTICOAGULATION CONSULT NOTE - Follow Up Consult  Pharmacy Consult for heparin Indication: VTE prophylaxis  Allergies  Allergen Reactions   Methotrexate Other (See Comments)    Blood count drops   Vancomycin Shortness Of Breath    Eyes watering, SOB, wheezing   Cefepime Other (See Comments)   Tape     Patient Measurements: Height: 5' 11"  (180.3 cm) Weight: 63.5 kg (140 lb) IBW/kg (Calculated) : 75.3  Vital Signs: Temp: 98.3 F (36.8 C) (07/27 1230) Temp Source: Oral (07/27 1230) BP: 167/77 (07/27 1415) Pulse Rate: 70 (07/27 1415)  Labs: Recent Labs    05/11/21 1628 05/12/21 1928 05/12/21 2103  HGB 8.0* 8.2*  --   HCT 25.8* 26.8*  --   PLT 136* 154  --   CREATININE 3.63* 5.49*  --   TROPONINIHS  --  32* 33*    Estimated Creatinine Clearance: 13.2 mL/min (A) (by C-G formula based on SCr of 5.49 mg/dL (H)).   Medications:  No AC/APT PTA & no pertinent drug allergies.  Assessment: 60 y.o. male w/ h/o ESRD-HD MWF,HTN, T2DM ,Anemia, CVA, seizure disorder presenting on referral from pcp office.   Plts: 136>154 Hgb: 8>8.2  Goal of Therapy:  Monitor platelets by anticoagulation protocol: Yes   Plan:  Start Heparin SQ 5000 units q8h  Lorna Dibble 05/13/2021,2:31 PM

## 2021-05-13 NOTE — Progress Notes (Signed)
Pt wife and sister at bedside. Wife states that pt has never missed a dialysis appt.

## 2021-05-13 NOTE — ED Notes (Signed)
Vs assessed. Coffee given to pt. No other needs found at moment.

## 2021-05-13 NOTE — Progress Notes (Signed)
Pt arrived to floor via stretcher at this time. Alert and oriented x 2. Pt does not know why he is here or the date. Pt is refusing skin assessment at this time. Unable to assess bottom. Pt oriented to room, call light within reach and bed in lowest position. Will continue to monitor.

## 2021-05-13 NOTE — Progress Notes (Signed)
[facility]  ROUNDING NOTE   Subjective:   [name] is a 60 y.o. male with past medical history of anemia, diabetes, encephalopathy, hypertension,stroke and ESRD on dialysis. Patient presented to ED with shortness of breath and cough. He has been admitted to [facility] for Shortness of breath [R06.02] Acute pulmonary edema ([place]) [J81.0] Fluid overload [E87.70] Other hypervolemia [E87.79]   Patient receives his dialysis treatments at [place] supervised by [facility]. His last treatment was on last Friday. He is a poor historian at this time is unable to provide accurate facts for current illness. He denies shortness of breath, but currently on 2L [place]. Denies nausea and vomiting at  this time. Denies pain and discomfort.   We have been consulted to manage dialysis needs during this admission.    Objective:  Vital signs in last 24 hours:  Temp:  [98 F (36.7 C)-98.4 F (36.9 C)] 98 F (36.7 C) (07/27 0738) Pulse Rate:  [63-73] 73 (07/27 0835) Resp:  [13-20] 20 (07/27 0835) BP: (165-191)/(68-86) 189/86 (07/27 0835) SpO2:  [94 %-100 %] 100 % (07/27 0835) Weight:  [63.5 kg] 63.5 kg (07/26 1924)  Weight change:  Filed Weights   05/12/21 1924  Weight: 63.5 kg    Intake/Output: No intake/output data recorded.   Intake/Output this shift:  No intake/output data recorded.  Physical Exam: General: NAD, sitting up in bed  Head: Normocephalic, atraumatic. Moist oral mucosal membranes  Eyes: Anicteric  Neck: Supple  Lungs:  Bilateral crackles, normal breathing effort,  O2  Heart: Regular rate and rhythm  Abdomen:  Soft, nontender  Extremities:  1+ peripheral edema.  Neurologic: Nonfocal, moving all four extremities  Skin: No lesions  Access: LUE AVF    Basic Metabolic Panel: Recent Labs  Lab 05/11/21 1628 05/12/21 1928  NA 140 136  K 3.3* 3.3*  CL 96 95*  CO2 31* 30  GLUCOSE 217* 170*  BUN 22 37*  CREATININE 3.63* 5.49*  CALCIUM 8.2* 8.2*     Liver Function Tests: Recent Labs  Lab 05/11/21 1628  AST 14  ALT 4  ALKPHOS 79  BILITOT 0.5  PROT 7.0  ALBUMIN 3.3*   No results for input(s): LIPASE, AMYLASE in the last 168 hours. No results for input(s): AMMONIA in the last 168 hours.  CBC: Recent Labs  Lab 05/11/21 1628 05/12/21 1928  WBC 5.8 6.2  HGB 8.0* 8.2*  HCT 25.8* 26.8*  MCV 79 83.0  PLT 136* 154    Cardiac Enzymes: No results for input(s): CKTOTAL, CKMB, CKMBINDEX, TROPONINI in the last 168 hours.  BNP: Invalid input(s): POCBNP  CBG: No results for input(s): GLUCAP in the last 168 hours.  Microbiology: Results for orders placed or performed during the hospital encounter of 05/12/21  Resp Panel by RT-PCR (Flu A&B, Covid) Nasopharyngeal Swab     Status: None   Collection Time: 05/12/21  9:03 PM   Specimen: Nasopharyngeal Swab; Nasopharyngeal(NP) swabs in vial transport medium  Result Value Ref Range Status   SARS Coronavirus 2 by RT PCR NEGATIVE NEGATIVE Final    Comment: (NOTE) SARS-CoV-2 target nucleic acids are NOT DETECTED.  The SARS-CoV-2 RNA is generally detectable in upper respiratory specimens during the acute phase of infection. The lowest concentration of SARS-CoV-2 viral copies this assay can detect is 138 copies/mL. A negative result does not preclude SARS-Cov-2 infection and should not be used as the sole basis for treatment or other patient management decisions. A negative result may occur with  improper specimen collection/handling, submission of specimen other than nasopharyngeal swab, presence of viral mutation(s) within the areas targeted by this assay, and inadequate number of viral copies(<138 copies/mL). A negative result must be combined with clinical observations, patient history, and epidemiological information. The expected result is Negative.  Fact Sheet for Patients:  EntrepreneurPulse.com.au  Fact Sheet for Healthcare Providers:   IncredibleEmployment.be  This test is no t yet approved or cleared by the [place] FDA and  has been authorized for detection and/or diagnosis of SARS-CoV-2 by FDA under an Emergency Use Authorization (EUA). This EUA will remain  in effect (meaning this test can be used) for the duration of the COVID-19 declaration under Section 564(b)(1) of the Act, 21 U.S.C.section 360bbb-3(b)(1), unless the authorization is terminated  or revoked sooner.       Influenza A by PCR NEGATIVE NEGATIVE Final   Influenza B by PCR NEGATIVE NEGATIVE Final    Comment: (NOTE) The Xpert Xpress SARS-CoV-2/FLU/RSV plus assay is intended as an aid in the diagnosis of influenza from Nasopharyngeal swab specimens and should not be used as a sole basis for treatment. Nasal washings and aspirates are unacceptable for Xpert Xpress SARS-CoV-2/FLU/RSV testing.  Fact Sheet for Patients: EntrepreneurPulse.com.au  Fact Sheet for Healthcare Providers: IncredibleEmployment.be  This test is not yet approved or cleared by the [place] FDA and has been authorized for detection and/or diagnosis of SARS-CoV-2 by FDA under an Emergency Use Authorization (EUA). This EUA will remain in effect (meaning this test can be used) for the duration of the COVID-19 declaration under Section 564(b)(1) of the Act, 21 U.S.C. section 360bbb-3(b)(1), unless the authorization is terminated or revoked.  Performed at [facility], [address] [place], [place] [address]   MRSA Next Gen by PCR, Nasal     Status: None   Collection Time: 05/12/21 11:06 PM   Specimen: Nasal Mucosa; Nasal Swab  Result Value Ref Range Status   MRSA by PCR Next Gen NOT DETECTED NOT DETECTED Final    Comment: (NOTE) The GeneXpert MRSA Assay (FDA approved for NASAL specimens only), is one component of a comprehensive MRSA colonization surveillance program. It is not intended to  diagnose MRSA infection nor to guide or monitor treatment for MRSA infections. Test performance is not FDA approved in patients less than 73 years old. Performed at [facility], [address] [place], [place] [address]   Culture, blood (routine x 2) Call MD if unable to obtain prior to antibiotics being given     Status: None (Preliminary result)   Collection Time: 05/12/21 11:54 PM   Specimen: BLOOD  Result Value Ref Range Status   Specimen Description BLOOD RIGHT FOREARM  Final   Special Requests   Final    BOTTLES DRAWN AEROBIC ONLY Blood Culture results may not be optimal due to an inadequate volume of blood received in culture bottles   Culture   Final    NO GROWTH < 12 HOURS Performed at [facility], [address], [place], [place] [address]    Report Status PENDING  Incomplete    Coagulation Studies: No results for input(s): LABPROT, INR in the last 72 hours.  Urinalysis: No results for input(s): COLORURINE, LABSPEC, PHURINE, GLUCOSEU, HGBUR, BILIRUBINUR, KETONESUR, PROTEINUR, UROBILINOGEN, NITRITE, LEUKOCYTESUR in the last 72 hours.  Invalid input(s): APPERANCEUR    Imaging: DG Chest 2 View  Result Date: 05/12/2021 CLINICAL DATA:  Intermittent shortness of breath. EXAM: CHEST - 2 VIEW COMPARISON:  May 12, 2021 (12:01 p.m.) FINDINGS: Stable diffusely increased interstitial lung markings are seen. Mild to moderate severity areas of atelectasis and/or infiltrate are noted within the bilateral lung bases, right greater than left. A small, stable right pleural effusion is noted. No pneumothorax is identified. The cardiac silhouette is mildly enlarged and unchanged in size. Radiopaque stents are again seen overlying the superior mediastinum and left axilla. Multiple right-sided rib deformities are seen. IMPRESSION: 1. Cardiomegaly with mild, stable interstitial edema. 2. Stable mild to moderate severity bibasilar atelectasis and/or infiltrate, right greater  than left. 3. Small, stable right pleural effusion. Electronically Signed   By: Virgina Norfolk M.D.   On: 05/12/2021 20:21   DG Chest 2 View  Result Date: 05/12/2021 CLINICAL DATA:  Cough and shortness of breath. EXAM: CHEST - 2 VIEW COMPARISON:  02/06/2021 FINDINGS: Vascular stents involving the left central venous structures. Right basilar chest densities are compatible with pleural fluid and compressive atelectasis. Cannot exclude airspace disease or pneumonia at the right lung base. Slightly increased interstitial lung densities bilaterally. Heart size is upper limits of normal. IMPRESSION: 1. Right basilar chest densities are compatible with pleural fluid with atelectasis and consolidation. Cannot exclude right lower lobe pneumonia. 2. Interstitial pulmonary edema. 3. These results will be called to the ordering clinician or representative by the Radiologist Assistant, and communication documented in the PACS or [employer]. Electronically Signed   By: Markus Daft M.D.   On: 05/12/2021 15:59   CT CHEST WO CONTRAST  Result Date: 05/13/2021 CLINICAL DATA:  Abnormal chest x-ray EXAM: CT CHEST WITHOUT CONTRAST TECHNIQUE: Multidetector CT imaging of the chest was performed following the standard protocol without IV contrast. COMPARISON:  Prior CT scan of the chest 05/19/2017 FINDINGS: Cardiovascular: Limited evaluation in the absence of intravenous contrast. Metallic stents are present in the left innominate vein and left axillary vein. The main pulmonary artery is enlarged at nearly form cm in diameter. The aorta is normal in caliber. Atherosclerotic calcifications noted in the aorta, branch arteries and throughout the coronary arteries. Marked cardiomegaly. The intracardiac blood pool is hypodense relative to the adjacent myocardium consistent with anemia. No pericardial effusion. Mediastinum/Nodes: Limited evaluation in the absence of intravenous contrast and asthenic neck body habitus with minimal  fat planes to separate the soft tissue density structures of the mediastinum. Left hilar lymph node measures 1 cm in short axis slightly larger when compared with prior imaging. Ill-defined soft tissue thickening throughout the right paratracheal and right perihilar space very poorly evaluated without intravenous contrast. Correlation with prior CT imaging demonstrates evidence of prior mediastinal abscess in these locations. This may represent residual scarring. Lungs/Pleura: Interlobular septal thickening. Diffuse bronchial wall thickening throughout both lungs. Debris present within the distal trachea and right mainstem bronchus likely representing secretions. Ground-glass attenuation airspace opacities with areas of more confluence noted in the right perihilar lung and extending along the Peri bronchovascular tree. Small right pleural effusion with thickened and calcified pleural rind. Upper Abdomen: Severe calcifications of visualized intra-abdominal vessels. Bilateral renal atrophy. No acute abnormality. Musculoskeletal: No acute fracture or aggressive appearing lytic or blastic osseous lesion. IMPRESSION: 1. Very limited evaluation in the absence of intravenous contrast, particularly given patient's very thin body habitus. Without fat planes to separate the similar density soft tissue structures within the mediastinum resolution is quite limited. 2. Cardiomegaly with pulmonary edema most consistent with CHF. 3. Changes are asymmetric and more impressive in the right perihilar region. In the appropriate clinical setting, a superimposed bronchopneumonia is  a consideration. Malignancy is also possible, but considered less likely. Consider repeat CT scan of the chest with intravenous contrast after treatment of patient's presumed CHF/volume overload. 4. Small volume of secretions present within the distal trachea and right mainstem bronchus. 5. Probable chronic ill-defined soft tissue thickening within the right  paratracheal mediastinum and right perihilar mediastinum may be secondary to prior mediastinal abscesses as seen on prior imaging from August of 2018. 6. Similarly, chronic small right-sided pleural effusion with thickened and calcified pleural rind is also likely residua from the event in 2018. 7. Pulmonary arterial enlargement suggests underlying pulmonary arterial hypertension. 8. The intracardiac blood pool is hypodense relative to the adjacent myocardium consistent with anemia. 9. Aortic and advanced coronary artery atherosclerotic calcifications. 10. Metallic stents in the left brachiocephalic and axillary veins likely related to hemodialysis access. Aortic Atherosclerosis (ICD10-I70.0). Electronically Signed   By: Jacqulynn Cadet M.D.   On: 05/13/2021 06:29     Medications:    azithromycin Stopped (05/13/21 0256)   cefTRIAXone (ROCEPHIN)  IV Stopped (05/13/21 0509)   furosemide 100 mg (05/13/21 0829)    amLODipine  10 mg Oral Daily   aspirin  81 mg Oral Daily   atorvastatin  80 mg Oral Daily   carvedilol  25 mg Oral BID   Chlorhexidine Gluconate Cloth  6 each Topical Q0600   cloNIDine  0.1 mg Oral BID   ferric citrate  420 mg Oral BID WC   gabapentin  100 mg Oral QHS   hydrALAZINE  50 mg Oral Q8H   levETIRAcetam  500 mg Oral Q M,W,F   lisinopril  40 mg Oral Daily   sevelamer carbonate  800 mg Oral TID WC   spironolactone  25 mg Oral Daily   albuterol  Assessment/ Plan:  [name] is a 60 y.o.  male past medical history of anemia, diabetes, encephalopathy, hypertension,stroke and ESRD on dialysis. Patient presented to ED with shortness of breath and cough. He has been admitted to [facility] for Shortness of breath [R06.02] Acute pulmonary edema ([place]) [J81.0] Fluid overload [E87.70] Other hypervolemia [E87.79]   [facility] [address]/MWF/LUE AVF  End stage renal disease on dialysis  Will maintain outpatient schedule, if possible Missed treatment on Monday Treatment  scheduled today with low UF based on recent loss of appetite. UF 1L achieved to help with shortness of breath.  Next treatment will be Friday, unless shortness of breath worsens.   2. Anemia of chronic kidney disease  Lab Results  Component Value Date   HGB 8.2 (L) 05/12/2021   Hgb below goal Will consider EPO with treatments  3. Secondary Hyperparathyroidism:  Lab Results  Component Value Date   PTH 283 (H) 01/08/2019   CALCIUM 8.2 (L) 05/12/2021   CAION 1.10 (L) 09/12/2020   PHOS 3.7 11/21/2020  Phosphorus at goal, [facility] outpatient  Continue Renvela with meals    LOS: 1   7/27/20229:07 AM

## 2021-05-13 NOTE — Plan of Care (Signed)

## 2021-05-14 ENCOUNTER — Inpatient Hospital Stay: Payer: Medicare Other

## 2021-05-14 DIAGNOSIS — E43 Unspecified severe protein-calorie malnutrition: Secondary | ICD-10-CM | POA: Insufficient documentation

## 2021-05-14 DIAGNOSIS — E8779 Other fluid overload: Secondary | ICD-10-CM | POA: Diagnosis not present

## 2021-05-14 DIAGNOSIS — J810 Acute pulmonary edema: Secondary | ICD-10-CM | POA: Diagnosis not present

## 2021-05-14 DIAGNOSIS — R0602 Shortness of breath: Secondary | ICD-10-CM | POA: Diagnosis not present

## 2021-05-14 LAB — GLUCOSE, CAPILLARY
Glucose-Capillary: 139 mg/dL — ABNORMAL HIGH (ref 70–99)
Glucose-Capillary: 153 mg/dL — ABNORMAL HIGH (ref 70–99)
Glucose-Capillary: 150 mg/dL — ABNORMAL HIGH (ref 70–99)
Glucose-Capillary: 166 mg/dL — ABNORMAL HIGH (ref 70–99)

## 2021-05-14 MED ORDER — ENSURE ENLIVE PO LIQD
237.0000 mL | Freq: Three times a day (TID) | ORAL | Status: DC
  Administered 2021-05-14 – 2021-05-15 (×2): 237 mL via ORAL

## 2021-05-14 MED ORDER — CLONIDINE HCL 0.1 MG PO TABS
0.1000 mg | ORAL_TABLET | Freq: Once | ORAL | Status: AC
  Administered 2021-05-14: 0.1 mg via ORAL
  Filled 2021-05-14: qty 1

## 2021-05-14 MED ORDER — RENA-VITE PO TABS
1.0000 | ORAL_TABLET | Freq: Every day | ORAL | Status: DC
  Administered 2021-05-14: 1 via ORAL
  Filled 2021-05-14 (×2): qty 1

## 2021-05-14 MED ORDER — ADULT MULTIVITAMIN W/MINERALS CH
1.0000 | ORAL_TABLET | Freq: Every day | ORAL | Status: DC
  Administered 2021-05-15: 1 via ORAL
  Filled 2021-05-14: qty 1

## 2021-05-14 MED ORDER — CLONIDINE HCL 0.1 MG PO TABS
0.2000 mg | ORAL_TABLET | Freq: Two times a day (BID) | ORAL | Status: DC
  Administered 2021-05-14 – 2021-05-15 (×2): 0.2 mg via ORAL
  Filled 2021-05-14 (×2): qty 2

## 2021-05-14 NOTE — TOC Initial Note (Addendum)
Transition of Care [facility]) - Initial/Assessment Note    Patient Details  Name: [name] MRN: [id number] Date of Birth: [birth year]  [facility] CM/SW Contact:    Alberteen Sam, LCSW Phone Number: 05/14/2021, 9:51 AM  Clinical Narrative:                  CSW consulted for heart failure home health screen. CSW spoke with patient's spouse [name] who confirms patient PCP is Dr. Caryn Section, and that he is followed by [facility]. [name] reports that he has no issues getting medications or getting to and from appointments. Reports that he uses [facility] for HD while wife works, wife transports to other appointments. Is agreeable to [facility] services for PT, RN, and aide. No preference of agency. Reports no DME needs, has walker and 3in1 at home.   CSW has sent out [facility] referral to [place] with [facility] has accepted referral.   CSW to continue to follow for discharge planning needs.   Expected Discharge Plan: [place] Barriers to Discharge: Continued Medical Work up   Patient Goals and CMS Choice   CMS Medicare.gov Compare Post Acute Care list provided to:: Patient Represenative (must comment) (wife [name]) Choice offered to / list presented to : Spouse  Expected Discharge Plan and Services Expected Discharge Plan: [place] Acute Care Choice: [place] arrangements for the past 2 months: Single Family Home                                      Prior Living Arrangements/Services Living arrangements for the past 2 months: Single Family Home Lives with:: Spouse Patient language and need for interpreter reviewed:: Yes Do you feel safe going back to the place where you live?: Yes      Need for Family Participation in Patient Care: Yes (Comment) Care giver support system in place?: Yes (comment)   Criminal Activity/Legal Involvement Pertinent to Current Situation/Hospitalization: No - Comment as  needed  Activities of Daily Living Home Assistive Devices/Equipment: Shower chair with back, Tub transfer bench, Eyeglasses, Environmental consultant (specify type), Transfer board, Wheelchair ADL Screening (condition at time of admission) Patient's cognitive ability adequate to safely complete daily activities?: No Is the patient deaf or have difficulty hearing?: No Does the patient have difficulty seeing, even when wearing glasses/contacts?: No Does the patient have difficulty concentrating, remembering, or making decisions?: Yes Patient able to express need for assistance with ADLs?: Yes Does the patient have difficulty dressing or bathing?: Yes Independently performs ADLs?: No Communication: Appropriate for developmental age Dressing (OT): Needs assistance Is this a change from baseline?: Pre-admission baseline Grooming: Needs assistance Is this a change from baseline?: Pre-admission baseline Feeding: Needs assistance Is this a change from baseline?: Pre-admission baseline Bathing: Needs assistance Is this a change from baseline?: Pre-admission baseline Toileting: Needs assistance Is this a change from baseline?: Pre-admission baseline In/Out Bed: Needs assistance Is this a change from baseline?: Pre-admission baseline Walks in Home: Needs assistance Is this a change from baseline?: Pre-admission baseline Does the patient have difficulty walking or climbing stairs?: Yes Weakness of Legs: Both Weakness of Arms/Hands: None  Permission Sought/Granted      Share Information with NAME: [name]  Permission granted to share info w AGENCY: [place]  Permission granted to share info w Relationship: spouse  Permission granted to  share info w Contact Information: [phone]  Emotional Assessment   Attitude/Demeanor/Rapport: Gracious Affect (typically observed): Calm Orientation: : Oriented to Self Alcohol / Substance Use: Not Applicable Psych Involvement: No (comment)  Admission diagnosis:   Shortness of breath [R06.02] Acute pulmonary edema (HCC) [J81.0] Fluid overload [E87.70] Other hypervolemia [E87.79] Patient Active Problem List   Diagnosis Date Noted   Acute pulmonary edema (HCC)    Shortness of breath    Fluid overload 05/12/2021   Cellulitis and abscess of buttock    History of GI bleed 02/06/2021   Long term current use of immunosuppressive drug 02/06/2021   Disorder of skin due to Crohn's disease ([place]) 27/25/3664   Complication of vascular access for dialysis 01/22/2021   Polyp of transverse colon    Acute metabolic encephalopathy 40/34/7425   Anemia in ESRD (end-stage renal disease) ([place]) 95/63/8756   Chronic systolic CHF (congestive heart failure) ([place]) 11/18/2020   Cerebrovascular disease 09/12/2020   Transient alteration of awareness 09/12/2020   Hyperkalemia 09/12/2020   Mild cognitive impairment 09/30/2019   Peripheral vascular disease ([place]) 06/28/2018   Sepsis ([place]) 01/12/2018   Pressure injury of skin 12/04/2017   S/P bilateral BKA (below knee amputation) ([place]) 10/20/2017   Atherosclerosis of native arteries of extremity with rest pain ([place]) 08/05/2017   History of CVA (cerebrovascular accident) 04/15/2017   Seizure disorder ([place]) 04/15/2017   End stage renal failure on dialysis ([place]) 04/12/2016   Aphthae 02/20/2016   Hidradenitis suppurativa 02/20/2016   Leg pain 02/20/2016   Neuropathy 02/20/2016   Narrowing of intervertebral disc space 08/29/2015   Vascular disorder of lower extremity 08/29/2015   Failure of erection 08/29/2015   Hypercholesteremia 08/29/2015   Hypertension 08/29/2015   Anemia due to chronic kidney disease 05/26/2015   Venous insufficiency of leg 09/04/2014   History of deep vein thrombosis (DVT) of lower extremity 08/16/2014   Prostatic intraepithelial neoplasia 11/02/2013   Elevated prostate specific antigen (PSA) 09/11/2013   Benign prostatic hyperplasia with urinary obstruction 08/13/2013   Spermatocele 08/13/2013    Avitaminosis D 01/25/2013   Type 2 diabetes mellitus with kidney complication, with long-term current use of insulin ([place]) 03/28/2012   Crohn disease ([place]) 08/03/2011   PCP:  [name], MD Pharmacy:   [facility] [phone] [address] [facility], [place] - [place] AT [facility] OF [address] [place] [address] Phone: [phone] Fax: [phone]     Social Determinants of Health (SDOH) Interventions    Readmission Risk Interventions Readmission Risk Prevention Plan 02/10/2021 02/08/2021 11/21/2020  Transportation Screening Complete Complete Complete  PCP or Specialist Appt within 5-7 Days - Complete -  Home Care Screening - Complete -  Medication Review (RN CM) - Complete -  HRI or Home Care Consult Complete - -  Palliative Care Screening Complete - -  Medication Review (RN Care Manager) Complete - -  Some recent data might be hidden

## 2021-05-14 NOTE — Progress Notes (Signed)
Notified nurse

## 2021-05-14 NOTE — Progress Notes (Signed)
[facility]  ROUNDING NOTE   Subjective:   [name] is a 60 y.o. male with past medical history of anemia, diabetes, encephalopathy, hypertension,stroke and ESRD on dialysis. Patient presented to ED with shortness of breath and cough. He has been admitted to [facility] for Shortness of breath [R06.02] Acute pulmonary edema ([place]) [J81.0] Fluid overload [E87.70] Other hypervolemia [E87.79]   Patient receives his dialysis treatments at [name] supervised by [facility]. We have been consulted to manage dialysis needs during this admission.   Patient seen sitting on bedside Alert and oriented States he feels a lot better Tolerating small meals Denies shortness of breath Currently on room air   Objective:  Vital signs in last 24 hours:  Temp:  [98.1 F (36.7 C)-98.6 F (37 C)] 98.1 F (36.7 C) (07/28 1134) Pulse Rate:  [68-83] 74 (07/28 1134) Resp:  [16-22] 20 (07/28 1134) BP: (135-197)/(71-114) 169/74 (07/28 1134) SpO2:  [91 %-99 %] 93 % (07/28 1134) Weight:  [60.3 kg] 60.3 kg (07/28 0228)  Weight change: -3.204 kg Filed Weights   05/12/21 1924 05/14/21 0228  Weight: 63.5 kg 60.3 kg    Intake/Output: I/O last 3 completed shifts: In: 698.7 [IV Piggyback:698.7] Out: 1000 [Other:1000]   Intake/Output this shift:  No intake/output data recorded.  Physical Exam: General: NAD, sitting up on bedside  Head: Normocephalic, atraumatic. Moist oral mucosal membranes  Eyes: Anicteric  Lungs:  Bilateral crackles, normal breathing effort, [place] O2  Heart: Regular rate and rhythm  Abdomen:  Soft, nontender  Extremities:  1+ peripheral edema.  Neurologic: Nonfocal, moving all four extremities  Skin: No lesions  Access: LUE AVF    Basic Metabolic Panel: Recent Labs  Lab 05/11/21 1628 05/12/21 1928  NA 140 136  K 3.3* 3.3*  CL 96 95*  CO2 31* 30  GLUCOSE 217* 170*  BUN 22 37*  CREATININE 3.63* 5.49*  CALCIUM 8.2* 8.2*     Liver Function Tests: Recent  Labs  Lab 05/11/21 1628  AST 14  ALT 4  ALKPHOS 79  BILITOT 0.5  PROT 7.0  ALBUMIN 3.3*    No results for input(s): LIPASE, AMYLASE in the last 168 hours. No results for input(s): AMMONIA in the last 168 hours.  CBC: Recent Labs  Lab 05/11/21 1628 05/12/21 1928  WBC 5.8 6.2  HGB 8.0* 8.2*  HCT 25.8* 26.8*  MCV 79 83.0  PLT 136* 154     Cardiac Enzymes: No results for input(s): CKTOTAL, CKMB, CKMBINDEX, TROPONINI in the last 168 hours.  BNP: Invalid input(s): POCBNP  CBG: Recent Labs  Lab 05/13/21 1817 05/13/21 2127 05/14/21 0807 05/14/21 1211  GLUCAP 123* 157* 139* 153*    Microbiology: Results for orders placed or performed during the hospital encounter of 05/12/21  Resp Panel by RT-PCR (Flu A&B, Covid) Nasopharyngeal Swab     Status: None   Collection Time: 05/12/21  9:03 PM   Specimen: Nasopharyngeal Swab; Nasopharyngeal(NP) swabs in vial transport medium  Result Value Ref Range Status   SARS Coronavirus 2 by RT PCR NEGATIVE NEGATIVE Final    Comment: (NOTE) SARS-CoV-2 target nucleic acids are NOT DETECTED.  The SARS-CoV-2 RNA is generally detectable in upper respiratory specimens during the acute phase of infection. The lowest concentration of SARS-CoV-2 viral copies this assay can detect is 138 copies/mL. A negative result does not preclude SARS-Cov-2 infection and should not be used as the sole basis for treatment or other patient management decisions. A negative result may occur  with  improper specimen collection/handling, submission of specimen other than nasopharyngeal swab, presence of viral mutation(s) within the areas targeted by this assay, and inadequate number of viral copies(<138 copies/mL). A negative result must be combined with clinical observations, patient history, and epidemiological information. The expected result is Negative.  Fact Sheet for Patients:  EntrepreneurPulse.com.au  Fact Sheet for Healthcare  Providers:  IncredibleEmployment.be  This test is no t yet approved or cleared by the [place] FDA and  has been authorized for detection and/or diagnosis of SARS-CoV-2 by FDA under an Emergency Use Authorization (EUA). This EUA will remain  in effect (meaning this test can be used) for the duration of the COVID-19 declaration under Section 564(b)(1) of the Act, 21 U.S.C.section 360bbb-3(b)(1), unless the authorization is terminated  or revoked sooner.       Influenza A by PCR NEGATIVE NEGATIVE Final   Influenza B by PCR NEGATIVE NEGATIVE Final    Comment: (NOTE) The Xpert Xpress SARS-CoV-2/FLU/RSV plus assay is intended as an aid in the diagnosis of influenza from Nasopharyngeal swab specimens and should not be used as a sole basis for treatment. Nasal washings and aspirates are unacceptable for Xpert Xpress SARS-CoV-2/FLU/RSV testing.  Fact Sheet for Patients: EntrepreneurPulse.com.au  Fact Sheet for Healthcare Providers: IncredibleEmployment.be  This test is not yet approved or cleared by the [place] FDA and has been authorized for detection and/or diagnosis of SARS-CoV-2 by FDA under an Emergency Use Authorization (EUA). This EUA will remain in effect (meaning this test can be used) for the duration of the COVID-19 declaration under Section 564(b)(1) of the Act, 21 U.S.C. section 360bbb-3(b)(1), unless the authorization is terminated or revoked.  Performed at [facility], [address] [place], [place] [address]   MRSA Next Gen by PCR, Nasal     Status: None   Collection Time: 05/12/21 11:06 PM   Specimen: Nasal Mucosa; Nasal Swab  Result Value Ref Range Status   MRSA by PCR Next Gen NOT DETECTED NOT DETECTED Final    Comment: (NOTE) The GeneXpert MRSA Assay (FDA approved for NASAL specimens only), is one component of a comprehensive MRSA colonization surveillance program. It is not  intended to diagnose MRSA infection nor to guide or monitor treatment for MRSA infections. Test performance is not FDA approved in patients less than 74 years old. Performed at [facility], [address] [place], [place] [address]   Culture, blood (routine x 2) Call MD if unable to obtain prior to antibiotics being given     Status: None (Preliminary result)   Collection Time: 05/12/21 11:54 PM   Specimen: BLOOD  Result Value Ref Range Status   Specimen Description BLOOD RIGHT FOREARM  Final   Special Requests   Final    BOTTLES DRAWN AEROBIC ONLY Blood Culture results may not be optimal due to an inadequate volume of blood received in culture bottles   Culture   Final    NO GROWTH 1 DAY Performed at [facility], [address] [place], [place] [address]    Report Status PENDING  Incomplete    Coagulation Studies: No results for input(s): LABPROT, INR in the last 72 hours.  Urinalysis: No results for input(s): COLORURINE, LABSPEC, PHURINE, GLUCOSEU, HGBUR, BILIRUBINUR, KETONESUR, PROTEINUR, UROBILINOGEN, NITRITE, LEUKOCYTESUR in the last 72 hours.  Invalid input(s): APPERANCEUR    Imaging: DG Chest 2 View  Result Date: 05/12/2021 CLINICAL DATA:  Intermittent shortness of breath. EXAM: CHEST - 2 VIEW COMPARISON:  May 12, 2021 (12:01 p.m.) FINDINGS: Stable diffusely increased interstitial lung markings are seen. Mild to moderate severity areas of atelectasis and/or infiltrate are noted within the bilateral lung bases, right greater than left. A small, stable right pleural effusion is noted. No pneumothorax is identified. The cardiac silhouette is mildly enlarged and unchanged in size. Radiopaque stents are again seen overlying the superior mediastinum and left axilla. Multiple right-sided rib deformities are seen. IMPRESSION: 1. Cardiomegaly with mild, stable interstitial edema. 2. Stable mild to moderate severity bibasilar atelectasis and/or infiltrate, right  greater than left. 3. Small, stable right pleural effusion. Electronically Signed   By: Virgina Norfolk M.D.   On: 05/12/2021 20:21   CT CHEST WO CONTRAST  Result Date: 05/13/2021 CLINICAL DATA:  Abnormal chest x-ray EXAM: CT CHEST WITHOUT CONTRAST TECHNIQUE: Multidetector CT imaging of the chest was performed following the standard protocol without IV contrast. COMPARISON:  Prior CT scan of the chest 05/19/2017 FINDINGS: Cardiovascular: Limited evaluation in the absence of intravenous contrast. Metallic stents are present in the left innominate vein and left axillary vein. The main pulmonary artery is enlarged at nearly form cm in diameter. The aorta is normal in caliber. Atherosclerotic calcifications noted in the aorta, branch arteries and throughout the coronary arteries. Marked cardiomegaly. The intracardiac blood pool is hypodense relative to the adjacent myocardium consistent with anemia. No pericardial effusion. Mediastinum/Nodes: Limited evaluation in the absence of intravenous contrast and asthenic neck body habitus with minimal fat planes to separate the soft tissue density structures of the mediastinum. Left hilar lymph node measures 1 cm in short axis slightly larger when compared with prior imaging. Ill-defined soft tissue thickening throughout the right paratracheal and right perihilar space very poorly evaluated without intravenous contrast. Correlation with prior CT imaging demonstrates evidence of prior mediastinal abscess in these locations. This may represent residual scarring. Lungs/Pleura: Interlobular septal thickening. Diffuse bronchial wall thickening throughout both lungs. Debris present within the distal trachea and right mainstem bronchus likely representing secretions. Ground-glass attenuation airspace opacities with areas of more confluence noted in the right perihilar lung and extending along the Peri bronchovascular tree. Small right pleural effusion with thickened and calcified  pleural rind. Upper Abdomen: Severe calcifications of visualized intra-abdominal vessels. Bilateral renal atrophy. No acute abnormality. Musculoskeletal: No acute fracture or aggressive appearing lytic or blastic osseous lesion. IMPRESSION: 1. Very limited evaluation in the absence of intravenous contrast, particularly given patient's very thin body habitus. Without fat planes to separate the similar density soft tissue structures within the mediastinum resolution is quite limited. 2. Cardiomegaly with pulmonary edema most consistent with CHF. 3. Changes are asymmetric and more impressive in the right perihilar region. In the appropriate clinical setting, a superimposed bronchopneumonia is a consideration. Malignancy is also possible, but considered less likely. Consider repeat CT scan of the chest with intravenous contrast after treatment of patient's presumed CHF/volume overload. 4. Small volume of secretions present within the distal trachea and right mainstem bronchus. 5. Probable chronic ill-defined soft tissue thickening within the right paratracheal mediastinum and right perihilar mediastinum may be secondary to prior mediastinal abscesses as seen on prior imaging from August of 2018. 6. Similarly, chronic small right-sided pleural effusion with thickened and calcified pleural rind is also likely residua from the event in 2018. 7. Pulmonary arterial enlargement suggests underlying pulmonary arterial hypertension. 8. The intracardiac blood pool is hypodense relative to the adjacent myocardium consistent with anemia. 9. Aortic and advanced coronary artery atherosclerotic calcifications. 10. Metallic stents in the left brachiocephalic  and axillary veins likely related to hemodialysis access. Aortic Atherosclerosis (ICD10-I70.0). Electronically Signed   By: Jacqulynn Cadet M.D.   On: 05/13/2021 06:29   ECHOCARDIOGRAM COMPLETE  Result Date: 05/13/2021    ECHOCARDIOGRAM REPORT   Patient Name:   BRANNAN CASSEDY  [place] Date of Exam: 05/13/2021 Medical Rec #:  [id number]       Height:       71.0 in Accession #:    [id number]      Weight:       140.0 lb Date of Birth:  [birth year]       BSA:          1.812 m Patient Age:    64 years        BP:           152/71 mmHg Patient Gender: M               HR:           63 bpm. Exam Location:  [facility] Procedure: 2D Echo, Cardiac Doppler and Color Doppler Indications:     CHF-acute systolic K59.93  History:         Patient has prior history of Echocardiogram examinations, most                  recent 09/13/2020. Risk Factors:Hypertension and Diabetes.  Sonographer:     Sherrie Sport RDCS (AE) Referring Phys:  [id number] Victoriano Lain A THOMAS Diagnosing Phys: Nelva Bush MD IMPRESSIONS  1. Left ventricular ejection fraction, by estimation, is 30 to 35%. The left ventricle has moderately decreased function. The left ventricle demonstrates global hypokinesis. The left ventricular internal cavity size was mildly dilated. There is mild left ventricular hypertrophy. Left ventricular diastolic parameters are consistent with Grade II diastolic dysfunction (pseudonormalization). Elevated left atrial pressure.  2. Right ventricular systolic function is moderately reduced. The right ventricular size is moderately enlarged. There is normal pulmonary artery systolic pressure.  3. Left atrial size was severely dilated.  4. Large pleural effusion in the left lateral region.  5. The mitral valve is abnormal. Mild to moderate mitral valve regurgitation. No evidence of mitral stenosis. Moderate mitral annular calcification.  6. Tricuspid valve regurgitation is moderate.  7. The aortic valve is tricuspid. There is moderate calcification of the aortic valve. There is moderate thickening of the aortic valve. Aortic valve regurgitation is not visualized. Mild to moderate aortic valve sclerosis/calcification is present, without any evidence of aortic stenosis.  8. The inferior vena cava is normal in size with <50%  respiratory variability, suggesting right atrial pressure of 8 mmHg. FINDINGS  Left Ventricle: Left ventricular ejection fraction, by estimation, is 30 to 35%. The left ventricle has moderately decreased function. The left ventricle demonstrates global hypokinesis. The left ventricular internal cavity size was mildly dilated. There is mild left ventricular hypertrophy. Left ventricular diastolic parameters are consistent with Grade II diastolic dysfunction (pseudonormalization). Elevated left atrial pressure. Right Ventricle: The right ventricular size is moderately enlarged. No increase in right ventricular wall thickness. Right ventricular systolic function is moderately reduced. There is normal pulmonary artery systolic pressure. The tricuspid regurgitant velocity is 2.59 m/s, and with an assumed right atrial pressure of 8 mmHg, the estimated right ventricular systolic pressure is 39.0 mmHg. Left Atrium: Left atrial size was severely dilated. Right Atrium: Right atrial size was normal in size. Pericardium: There is no evidence of pericardial effusion. Mitral Valve: The mitral valve is abnormal. There is mild thickening of the mitral  valve leaflet(s). Moderate mitral annular calcification. Mild to moderate mitral valve regurgitation. No evidence of mitral valve stenosis. Tricuspid Valve: The tricuspid valve is grossly normal. Tricuspid valve regurgitation is moderate. Aortic Valve: The aortic valve is tricuspid. There is moderate calcification of the aortic valve. There is moderate thickening of the aortic valve. Aortic valve regurgitation is not visualized. Mild to moderate aortic valve sclerosis/calcification is present, without any evidence of aortic stenosis. Aortic valve mean gradient measures 3.0 mmHg. Aortic valve peak gradient measures 5.9 mmHg. Aortic valve area, by VTI measures 2.06 cm. Pulmonic Valve: The pulmonic valve was grossly normal. Pulmonic valve regurgitation is not visualized. No evidence of  pulmonic stenosis. Aorta: The aortic root is normal in size and structure. Pulmonary Artery: The pulmonary artery is of normal size. Venous: The inferior vena cava is normal in size with less than 50% respiratory variability, suggesting right atrial pressure of 8 mmHg. IAS/Shunts: There is right bowing of the interatrial septum, suggestive of elevated left atrial pressure. The interatrial septum was not well visualized. Additional Comments: There is a large pleural effusion in the left lateral region.  LEFT VENTRICLE PLAX 2D LVIDd:         5.70 cm      Diastology LVIDs:         4.60 cm      LV e' medial:    4.90 cm/s LV PW:         1.40 cm      LV E/e' medial:  21.0 LV IVS:        1.00 cm      LV e' lateral:   4.90 cm/s LVOT diam:     2.00 cm      LV E/e' lateral: 21.0 LV SV:         51 LV SV Index:   28 LVOT Area:     3.14 cm  LV Volumes (MOD) LV vol d, MOD A2C: 197.0 ml LV vol d, MOD A4C: 223.0 ml LV vol s, MOD A2C: 113.0 ml LV vol s, MOD A4C: 104.0 ml LV SV MOD A2C:     84.0 ml LV SV MOD A4C:     223.0 ml LV SV MOD BP:      103.0 ml RIGHT VENTRICLE RV Basal diam:  6.00 cm RV S prime:     9.79 cm/s TAPSE (M-mode): 1.6 cm LEFT ATRIUM              Index       RIGHT ATRIUM           Index LA diam:        5.70 cm  3.15 cm/m  RA Area:     16.80 cm LA Vol (A2C):   94.0 ml  51.88 ml/m RA Volume:   39.50 ml  21.80 ml/m LA Vol (A4C):   143.0 ml 78.92 ml/m LA Biplane Vol: 121.0 ml 66.78 ml/m  AORTIC VALVE                   PULMONIC VALVE AV Area (Vmax):    1.86 cm    PV Vmax:        0.78 m/s AV Area (Vmean):   2.15 cm    PV Peak grad:   2.5 mmHg AV Area (VTI):     2.06 cm    RVOT Peak grad: 4 mmHg AV Vmax:           121.00 cm/s AV Vmean:  70.600 cm/s AV VTI:            0.248 m AV Peak Grad:      5.9 mmHg AV Mean Grad:      3.0 mmHg LVOT Vmax:         71.80 cm/s LVOT Vmean:        48.400 cm/s LVOT VTI:          0.163 m LVOT/AV VTI ratio: 0.66  AORTA Ao Root diam: 2.70 cm MITRAL VALVE                 TRICUSPID VALVE MV Area (PHT): 4.04 cm     TR Peak grad:   26.8 mmHg MV Decel Time: 188 msec     TR Vmax:        259.00 cm/s MV E velocity: 103.00 cm/s MV A velocity: 55.30 cm/s   SHUNTS MV E/A ratio:  1.86         Systemic VTI:  0.16 m                             Systemic Diam: 2.00 cm Nelva Bush MD Electronically signed by Nelva Bush MD Signature Date/Time: 05/13/2021/4:59:08 PM    Final      Medications:    azithromycin Stopped (05/13/21 2338)   cefTRIAXone (ROCEPHIN)  IV Stopped (05/14/21 0008)    amLODipine  10 mg Oral Daily   aspirin  81 mg Oral Daily   atorvastatin  80 mg Oral Daily   carvedilol  25 mg Oral BID   Chlorhexidine Gluconate Cloth  6 each Topical Q0600   cloNIDine  0.2 mg Oral BID   ferric citrate  420 mg Oral BID WC   gabapentin  100 mg Oral QHS   heparin injection (subcutaneous)  5,000 Units Subcutaneous Q8H   hydrALAZINE  50 mg Oral Q8H   insulin aspart  0-5 Units Subcutaneous QHS   insulin aspart  0-6 Units Subcutaneous TID WC   levETIRAcetam  500 mg Oral Q M,W,F   levETIRAcetam  500 mg Oral Daily   lisinopril  40 mg Oral Daily   sevelamer carbonate  800 mg Oral TID WC   spironolactone  25 mg Oral Daily   albuterol  Assessment/ Plan:  Mr. Kwesi Sangha Attwood is a 60 y.o.  male past medical history of anemia, diabetes, encephalopathy, hypertension,stroke and ESRD on dialysis. Patient presented to ED with shortness of breath and cough. He has been admitted to [facility] for Shortness of breath [R06.02] Acute pulmonary edema ([place]) [J81.0] Fluid overload [E87.70] Other hypervolemia [E87.79]   [facility] [address]/MWF/LUE AVF  End stage renal disease on dialysis  Will maintain outpatient schedule, if possible According to wife, he had not missed a treatment. Received dialysis yesterday, tolerated well. Uf 1L achieved. If remains inpatient, will dialyze on Friday.   2. Anemia of chronic kidney disease  Lab Results  Component Value Date   HGB 8.2 (L)  05/12/2021   Hgb not at target Will discuss EPO outpatient  3. Secondary Hyperparathyroidism:  Lab Results  Component Value Date   PTH 283 (H) 01/08/2019   CALCIUM 8.2 (L) 05/12/2021   CAION 1.10 (L) 09/12/2020   PHOS 3.7 11/21/2020  A[place]xia outpatient  Renvela with meals    LOS: 2   7/28/20221:20 PM

## 2021-05-14 NOTE — Progress Notes (Signed)
Initial Nutrition Assessment  DOCUMENTATION CODES:   Severe malnutrition in context of chronic illness  INTERVENTION:   Ensure Enlive po TID, each supplement provides 350 kcal and 20 grams of protein  MVI po daily   Rena-vit po daily   Liberalize diet   Pt at high refeed risk; recommend monitor potassium, magnesium and phosphorus labs daily until stable  NUTRITION DIAGNOSIS:   Severe Malnutrition related to chronic illness (ESRD on HD, CHF, DM, Crohns) as evidenced by severe muscle depletion, severe fat depletion.  GOAL:   Patient will meet greater than or equal to 90% of their needs  MONITOR:   PO intake, Supplement acceptance, Labs, Weight trends, Skin, I & O's  REASON FOR ASSESSMENT:   Consult Assessment of nutrition requirement/status  ASSESSMENT:   60 y/o male with h/o CHF, ESRD on HD, b/l BKAs, DM, HTN, seizures, HLD, DVT, Crohns and BPH who is admitted with volume overload.  Met with pt in room today. Pt reports poor appetite and oral intake at baseline and in hospital. Pt reports that he is feeling a little better today and that he did eat about 60% of his lunch that included tilapia and corn; pt is documented to have eaten 25% of his lunch and only sips and bites of his breakfast. Pt reports that he does not drink supplements at home but he enjoys chocolate Ensure. RD discussed with pt the importance of adequate intake needed to preserve lean muscle. RD will add supplements and vitamins to help pt meet his estimated needs and to support losses from HD. RD will also liberalize pt's diet as pt is not eating enough to exceed any nutrient limits. Per chart, pt is down 17lbs(11%) over the past year.   Medications reviewed and include: aspirin, ferric citrate, heparin, insulin, renvela, aldactone, azithromycin, ceftriaxone   Labs reviewed: K 3.3(L), BUN 37(H), creat 5.49(H) BNP >4500(H) Hgb 8.2(L), Hct 26.8(L) Cbgs- 139, 153, 150 x 24 hrs AIC 6.3(H)-  4/22  NUTRITION - FOCUSED PHYSICAL EXAM:  Flowsheet Row Most Recent Value  Orbital Region Severe depletion  Upper Arm Region Severe depletion  Thoracic and Lumbar Region Severe depletion  Buccal Region Severe depletion  Temple Region Severe depletion  Clavicle Bone Region Severe depletion  Clavicle and Acromion Bone Region Severe depletion  Scapular Bone Region Severe depletion  Dorsal Hand Severe depletion  Patellar Region Severe depletion  Anterior Thigh Region Severe depletion  Posterior Calf Region Severe depletion  Edema (RD Assessment) None  Hair Reviewed  Eyes Reviewed  Mouth Reviewed  Skin Reviewed  Nails Reviewed   Diet Order:   Diet Order             Diet Carb Modified Fluid consistency: Thin; Room service appropriate? Yes; Fluid restriction: 1200 mL Fluid  Diet effective now                  EDUCATION NEEDS:   Education needs have been addressed  Skin:  Skin Assessment: Reviewed RN Assessment  Last BM:  pta  Height:   Ht Readings from Last 1 Encounters:  05/12/21 5' 11"  (1.803 m)    Weight:   Wt Readings from Last 1 Encounters:  05/14/21 60.3 kg    Ideal Body Weight:  68.8 kg (adjusted for b/l BKAs)  BMI:  Body mass index is 18.54 kg/m.  Estimated Nutritional Needs:   Kcal:  1900-2200kcal/day  Protein:  95-110g/day  Fluid:  UOP +1L  Koleen Distance MS, RD, LDN Please refer to  AMION for RD and/or RD on-call/weekend/after hours pager

## 2021-05-14 NOTE — Progress Notes (Signed)
PROGRESS NOTE    [name]  SJG:[id number] DOB: 1961-10-13 DOA: 05/12/2021 PCP: [name], MD   Brief Narrative: Taken from H&P. [name] is a 60 y.o. male with medical history significant of  ESRD MWF,HTN, DMII,Anemia,CVA, seizure disorder who presents in referral from pcp office. Patient per chart has had interim history of increase weakness with falls at home. He also over the last few days has noted intermittent sob/chest pain and cough. Patient followed with pcp for these varied complaints and on evaluation was noted to have  CXR noting fluid overload and also concern for possible consolidation. Due to these findings and patient symptoms , patient was referred to ED.  Patient had her last dialysis on Friday, missed on Monday per patient but according to wife he did not missed any dialysis.  BNP markedly elevated.  No leukocytosis, afebrile.  Received dialysis with 1 L of ultrafiltrate.  Significant improvement in respiratory status.  Wife requesting another night stay and getting dialysis while in the hospital tomorrow as she is concerned that he is feeling weak and she might not be able to take him there tomorrow.  Subjective: Patient seems improving, with some improvement in breathing and coughing.  Clinically appears euvolemic.  Appetite remained poor but according to patient he ate little better today.  Assessment & Plan:   Active Problems:   Fluid overload  Acute on chronic HFrEF secondary to missed dialysis?? Patient with generalized weakness and missed 1 dialysis.  BNP markedly elevated above 4500.  Chest x-ray with vascular congestion and possible infiltrate.  CT chest without contrast was inconclusive and they were recommending repeat CT chest with contrast for follow-up if needed. Patient was started on Lasix infusion-minimally made urine.  No urine output since in the hospital, Lasix infusion was discontinued.  Patient received dialysis yesterday.  Appears  euvolemic today. -Stop Lasix infusion. -Volume will be managed with dialysis. -Repeat echocardiogram-with EF of 30 to 35%, reduced right ventricular systolic function, dilated chambers, IVC with less than 50% collapsibility.  Large left pleural effusion. -Repeat chest x-ray if persistent effusion we can do thoracentesis. -Continue with home dose of carvedilol and spironolactone.  ESRD.  Nephrology is on board. -Continue with scheduled dialysis.  Questionable infiltrate on chest imaging.  Patient with no leukocytosis or fever.  Doubt he has any pneumonia.  He was started on CAP coverage with ceftriaxone and azithromycin. -Repeat chest x-ray after dialysis -Check procalcitonin-add 0.75. -Continue antibiotics.  Hypertension.  Blood pressure elevated. On multiple antihypertensives at home-doubt about compliance. -Continue home medications and monitor.  Hyperlipidemia. -Continue statin.  Seizure disorder.  No acute concern -Continue with Keppra.  Type 2 diabetes mellitus.  CBG within goal.  A1c elevated at 8.2.  Patient was not on any antidiabetic medications at home. -Sensitive/renal SSI  History of anemia.  Most likely secondary to renal disease.  Also has an history of GI bleed.  Hemoglobin stable. -Continue with home dose of Auryxia.   Objective: Vitals:   05/14/21 0745 05/14/21 0835 05/14/21 0942 05/14/21 1134  BP: (!) 190/82 (!) 197/78 (!) 165/76 (!) 169/74  Pulse: 77 82 72 74  Resp: 18   20  Temp: 98.2 F (36.8 C)   98.1 F (36.7 C)  TempSrc:      SpO2: 97%   93%  Weight:      Height:        Intake/Output Summary (Last 24 hours) at 05/14/2021 1541 Last data filed at 05/14/2021 1437 Gross  per 24 hour  Intake 778.68 ml  Output 1000 ml  Net -221.32 ml   Filed Weights   05/12/21 1924 05/14/21 0228  Weight: 63.5 kg 60.3 kg    Examination:  General.  Malnourished gentleman, in no acute distress. Pulmonary.  Lungs clear bilaterally, normal respiratory  effort. CV.  Regular rate and rhythm, no JVD, rub or murmur. Abdomen.  Soft, nontender, nondistended, BS positive. CNS.  Alert and oriented.  No focal neurologic deficit. Extremities.  Bilateral BKA Psychiatry.  Judgment and insight appears normal.   DVT prophylaxis: Heparin Code Status: Full Family Communication: Discussed with patient and updated the wife on phone. Disposition Plan:  Status is: Inpatient  Remains inpatient appropriate because:Inpatient level of care appropriate due to severity of illness  Dispo: The patient is from: Home              Anticipated d/c is to: Home              Patient currently is not medically stable to d/c.   Difficult to place patient No              Level of care: Progressive Cardiac  All the records are reviewed and case discussed with Care Management/Social Worker. Management plans discussed with the patient, nursing and they are in agreement.  Consultants:  Nephrology  Procedures:  Antimicrobials:  Ceftriaxone Zithromax  Data Reviewed: I have personally reviewed following labs and imaging studies  CBC: Recent Labs  Lab 05/11/21 1628 05/12/21 1928  WBC 5.8 6.2  HGB 8.0* 8.2*  HCT 25.8* 26.8*  MCV 79 83.0  PLT 136* 528    Basic Metabolic Panel: Recent Labs  Lab 05/11/21 1628 05/12/21 1928  NA 140 136  K 3.3* 3.3*  CL 96 95*  CO2 31* 30  GLUCOSE 217* 170*  BUN 22 37*  CREATININE 3.63* 5.49*  CALCIUM 8.2* 8.2*    GFR: Estimated Creatinine Clearance: 12.5 mL/min (A) (by C-G formula based on SCr of 5.49 mg/dL (H)). Liver Function Tests: Recent Labs  Lab 05/11/21 1628  AST 14  ALT 4  ALKPHOS 79  BILITOT 0.5  PROT 7.0  ALBUMIN 3.3*    No results for input(s): LIPASE, AMYLASE in the last 168 hours. No results for input(s): AMMONIA in the last 168 hours. Coagulation Profile: No results for input(s): INR, PROTIME in the last 168 hours. Cardiac Enzymes: No results for input(s): CKTOTAL, CKMB, CKMBINDEX,  TROPONINI in the last 168 hours. BNP (last 3 results) No results for input(s): PROBNP in the last 8760 hours. HbA1C: No results for input(s): HGBA1C in the last 72 hours. CBG: Recent Labs  Lab 05/13/21 1817 05/13/21 2127 05/14/21 0807 05/14/21 1211  GLUCAP 123* 157* 139* 153*   Lipid Profile: No results for input(s): CHOL, HDL, LDLCALC, TRIG, CHOLHDL, LDLDIRECT in the last 72 hours. Thyroid Function Tests: Recent Labs    05/11/21 1628  TSH 2.280    Anemia Panel: No results for input(s): VITAMINB12, FOLATE, FERRITIN, TIBC, IRON, RETICCTPCT in the last 72 hours. Sepsis Labs: Recent Labs  Lab 05/13/21 1700  PROCALCITON 0.75    Recent Results (from the past 240 hour(s))  Resp Panel by RT-PCR (Flu A&B, Covid) Nasopharyngeal Swab     Status: None   Collection Time: 05/12/21  9:03 PM   Specimen: Nasopharyngeal Swab; Nasopharyngeal(NP) swabs in vial transport medium  Result Value Ref Range Status   SARS Coronavirus 2 by RT PCR NEGATIVE NEGATIVE Final    Comment: (NOTE)  SARS-CoV-2 target nucleic acids are NOT DETECTED.  The SARS-CoV-2 RNA is generally detectable in upper respiratory specimens during the acute phase of infection. The lowest concentration of SARS-CoV-2 viral copies this assay can detect is 138 copies/mL. A negative result does not preclude SARS-Cov-2 infection and should not be used as the sole basis for treatment or other patient management decisions. A negative result may occur with  improper specimen collection/handling, submission of specimen other than nasopharyngeal swab, presence of viral mutation(s) within the areas targeted by this assay, and inadequate number of viral copies(<138 copies/mL). A negative result must be combined with clinical observations, patient history, and epidemiological information. The expected result is Negative.  Fact Sheet for Patients:  EntrepreneurPulse.com.au  Fact Sheet for Healthcare Providers:   IncredibleEmployment.be  This test is no t yet approved or cleared by the [place] FDA and  has been authorized for detection and/or diagnosis of SARS-CoV-2 by FDA under an Emergency Use Authorization (EUA). This EUA will remain  in effect (meaning this test can be used) for the duration of the COVID-19 declaration under Section 564(b)(1) of the Act, 21 U.S.C.section 360bbb-3(b)(1), unless the authorization is terminated  or revoked sooner.       Influenza A by PCR NEGATIVE NEGATIVE Final   Influenza B by PCR NEGATIVE NEGATIVE Final    Comment: (NOTE) The Xpert Xpress SARS-CoV-2/FLU/RSV plus assay is intended as an aid in the diagnosis of influenza from Nasopharyngeal swab specimens and should not be used as a sole basis for treatment. Nasal washings and aspirates are unacceptable for Xpert Xpress SARS-CoV-2/FLU/RSV testing.  Fact Sheet for Patients: EntrepreneurPulse.com.au  Fact Sheet for Healthcare Providers: IncredibleEmployment.be  This test is not yet approved or cleared by the [place] FDA and has been authorized for detection and/or diagnosis of SARS-CoV-2 by FDA under an Emergency Use Authorization (EUA). This EUA will remain in effect (meaning this test can be used) for the duration of the COVID-19 declaration under Section 564(b)(1) of the Act, 21 U.S.C. section 360bbb-3(b)(1), unless the authorization is terminated or revoked.  Performed at [facility], [address] [place], [place] [address]   MRSA Next Gen by PCR, Nasal     Status: None   Collection Time: 05/12/21 11:06 PM   Specimen: Nasal Mucosa; Nasal Swab  Result Value Ref Range Status   MRSA by PCR Next Gen NOT DETECTED NOT DETECTED Final    Comment: (NOTE) The GeneXpert MRSA Assay (FDA approved for NASAL specimens only), is one component of a comprehensive MRSA colonization surveillance program. It is not intended to  diagnose MRSA infection nor to guide or monitor treatment for MRSA infections. Test performance is not FDA approved in patients less than 37 years old. Performed at [facility], [address] [place], [place] [address]   Culture, blood (routine x 2) Call MD if unable to obtain prior to antibiotics being given     Status: None (Preliminary result)   Collection Time: 05/12/21 11:54 PM   Specimen: BLOOD  Result Value Ref Range Status   Specimen Description BLOOD RIGHT FOREARM  Final   Special Requests   Final    BOTTLES DRAWN AEROBIC ONLY Blood Culture results may not be optimal due to an inadequate volume of blood received in culture bottles   Culture   Final    NO GROWTH 1 DAY Performed at [facility], [address], [place], [place] [address]    Report Status PENDING  Incomplete  Radiology Studies: DG Chest 2 View  Result Date: 05/14/2021 CLINICAL DATA:  Shortness of breath. EXAM: CHEST - 2 VIEW COMPARISON:  May 12, 2021. FINDINGS: Stable cardiomegaly is noted. Bilateral perihilar and basilar densities are noted concerning for edema or atelectasis. Stable loculated right pleural effusion is noted. Bony thorax is unremarkable. IMPRESSION: Stable cardiomegaly. Stable bilateral perihilar and basilar edema or atelectasis. Stable loculated right pleural effusion. Electronically Signed   By: Marijo Conception M.D.   On: 05/14/2021 15:36   DG Chest 2 View  Result Date: 05/12/2021 CLINICAL DATA:  Intermittent shortness of breath. EXAM: CHEST - 2 VIEW COMPARISON:  May 12, 2021 (12:01 p.m.) FINDINGS: Stable diffusely increased interstitial lung markings are seen. Mild to moderate severity areas of atelectasis and/or infiltrate are noted within the bilateral lung bases, right greater than left. A small, stable right pleural effusion is noted. No pneumothorax is identified. The cardiac silhouette is mildly enlarged and unchanged in size. Radiopaque stents are again seen  overlying the superior mediastinum and left axilla. Multiple right-sided rib deformities are seen. IMPRESSION: 1. Cardiomegaly with mild, stable interstitial edema. 2. Stable mild to moderate severity bibasilar atelectasis and/or infiltrate, right greater than left. 3. Small, stable right pleural effusion. Electronically Signed   By: Virgina Norfolk M.D.   On: 05/12/2021 20:21   CT CHEST WO CONTRAST  Result Date: 05/13/2021 CLINICAL DATA:  Abnormal chest x-ray EXAM: CT CHEST WITHOUT CONTRAST TECHNIQUE: Multidetector CT imaging of the chest was performed following the standard protocol without IV contrast. COMPARISON:  Prior CT scan of the chest 05/19/2017 FINDINGS: Cardiovascular: Limited evaluation in the absence of intravenous contrast. Metallic stents are present in the left innominate vein and left axillary vein. The main pulmonary artery is enlarged at nearly form cm in diameter. The aorta is normal in caliber. Atherosclerotic calcifications noted in the aorta, branch arteries and throughout the coronary arteries. Marked cardiomegaly. The intracardiac blood pool is hypodense relative to the adjacent myocardium consistent with anemia. No pericardial effusion. Mediastinum/Nodes: Limited evaluation in the absence of intravenous contrast and asthenic neck body habitus with minimal fat planes to separate the soft tissue density structures of the mediastinum. Left hilar lymph node measures 1 cm in short axis slightly larger when compared with prior imaging. Ill-defined soft tissue thickening throughout the right paratracheal and right perihilar space very poorly evaluated without intravenous contrast. Correlation with prior CT imaging demonstrates evidence of prior mediastinal abscess in these locations. This may represent residual scarring. Lungs/Pleura: Interlobular septal thickening. Diffuse bronchial wall thickening throughout both lungs. Debris present within the distal trachea and right mainstem bronchus  likely representing secretions. Ground-glass attenuation airspace opacities with areas of more confluence noted in the right perihilar lung and extending along the Peri bronchovascular tree. Small right pleural effusion with thickened and calcified pleural rind. Upper Abdomen: Severe calcifications of visualized intra-abdominal vessels. Bilateral renal atrophy. No acute abnormality. Musculoskeletal: No acute fracture or aggressive appearing lytic or blastic osseous lesion. IMPRESSION: 1. Very limited evaluation in the absence of intravenous contrast, particularly given patient's very thin body habitus. Without fat planes to separate the similar density soft tissue structures within the mediastinum resolution is quite limited. 2. Cardiomegaly with pulmonary edema most consistent with CHF. 3. Changes are asymmetric and more impressive in the right perihilar region. In the appropriate clinical setting, a superimposed bronchopneumonia is a consideration. Malignancy is also possible, but considered less likely. Consider repeat CT scan of the chest with intravenous contrast after treatment of patient's  presumed CHF/volume overload. 4. Small volume of secretions present within the distal trachea and right mainstem bronchus. 5. Probable chronic ill-defined soft tissue thickening within the right paratracheal mediastinum and right perihilar mediastinum may be secondary to prior mediastinal abscesses as seen on prior imaging from August of 2018. 6. Similarly, chronic small right-sided pleural effusion with thickened and calcified pleural rind is also likely residua from the event in 2018. 7. Pulmonary arterial enlargement suggests underlying pulmonary arterial hypertension. 8. The intracardiac blood pool is hypodense relative to the adjacent myocardium consistent with anemia. 9. Aortic and advanced coronary artery atherosclerotic calcifications. 10. Metallic stents in the left brachiocephalic and axillary veins likely related  to hemodialysis access. Aortic Atherosclerosis (ICD10-I70.0). Electronically Signed   By: Jacqulynn Cadet M.D.   On: 05/13/2021 06:29   ECHOCARDIOGRAM COMPLETE  Result Date: 05/13/2021    ECHOCARDIOGRAM REPORT   Patient Name:   [name] Date of Exam: 05/13/2021 Medical Rec #:  [id number]       Height:       71.0 in Accession #:    [id number]      Weight:       140.0 lb Date of Birth:  Sep 10, 1961       BSA:          1.812 m Patient Age:    64 years        BP:           152/71 mmHg Patient Gender: M               HR:           63 bpm. Exam Location:  [facility] Procedure: 2D Echo, Cardiac Doppler and Color Doppler Indications:     CHF-acute systolic X54.00  History:         Patient has prior history of Echocardiogram examinations, most                  recent 09/13/2020. Risk Factors:Hypertension and Diabetes.  Sonographer:     Sherrie Sport RDCS (AE) Referring Phys:  [id number] Victoriano Lain A THOMAS Diagnosing Phys: Nelva Bush MD IMPRESSIONS  1. Left ventricular ejection fraction, by estimation, is 30 to 35%. The left ventricle has moderately decreased function. The left ventricle demonstrates global hypokinesis. The left ventricular internal cavity size was mildly dilated. There is mild left ventricular hypertrophy. Left ventricular diastolic parameters are consistent with Grade II diastolic dysfunction (pseudonormalization). Elevated left atrial pressure.  2. Right ventricular systolic function is moderately reduced. The right ventricular size is moderately enlarged. There is normal pulmonary artery systolic pressure.  3. Left atrial size was severely dilated.  4. Large pleural effusion in the left lateral region.  5. The mitral valve is abnormal. Mild to moderate mitral valve regurgitation. No evidence of mitral stenosis. Moderate mitral annular calcification.  6. Tricuspid valve regurgitation is moderate.  7. The aortic valve is tricuspid. There is moderate calcification of the aortic valve. There is moderate  thickening of the aortic valve. Aortic valve regurgitation is not visualized. Mild to moderate aortic valve sclerosis/calcification is present, without any evidence of aortic stenosis.  8. The inferior vena cava is normal in size with <50% respiratory variability, suggesting right atrial pressure of 8 mmHg. FINDINGS  Left Ventricle: Left ventricular ejection fraction, by estimation, is 30 to 35%. The left ventricle has moderately decreased function. The left ventricle demonstrates global hypokinesis. The left ventricular internal cavity size was mildly dilated. There is mild left ventricular hypertrophy. Left  ventricular diastolic parameters are consistent with Grade II diastolic dysfunction (pseudonormalization). Elevated left atrial pressure. Right Ventricle: The right ventricular size is moderately enlarged. No increase in right ventricular wall thickness. Right ventricular systolic function is moderately reduced. There is normal pulmonary artery systolic pressure. The tricuspid regurgitant velocity is 2.59 m/s, and with an assumed right atrial pressure of 8 mmHg, the estimated right ventricular systolic pressure is 02.5 mmHg. Left Atrium: Left atrial size was severely dilated. Right Atrium: Right atrial size was normal in size. Pericardium: There is no evidence of pericardial effusion. Mitral Valve: The mitral valve is abnormal. There is mild thickening of the mitral valve leaflet(s). Moderate mitral annular calcification. Mild to moderate mitral valve regurgitation. No evidence of mitral valve stenosis. Tricuspid Valve: The tricuspid valve is grossly normal. Tricuspid valve regurgitation is moderate. Aortic Valve: The aortic valve is tricuspid. There is moderate calcification of the aortic valve. There is moderate thickening of the aortic valve. Aortic valve regurgitation is not visualized. Mild to moderate aortic valve sclerosis/calcification is present, without any evidence of aortic stenosis. Aortic valve  mean gradient measures 3.0 mmHg. Aortic valve peak gradient measures 5.9 mmHg. Aortic valve area, by VTI measures 2.06 cm. Pulmonic Valve: The pulmonic valve was grossly normal. Pulmonic valve regurgitation is not visualized. No evidence of pulmonic stenosis. Aorta: The aortic root is normal in size and structure. Pulmonary Artery: The pulmonary artery is of normal size. Venous: The inferior vena cava is normal in size with less than 50% respiratory variability, suggesting right atrial pressure of 8 mmHg. IAS/Shunts: There is right bowing of the interatrial septum, suggestive of elevated left atrial pressure. The interatrial septum was not well visualized. Additional Comments: There is a large pleural effusion in the left lateral region.  LEFT VENTRICLE PLAX 2D LVIDd:         5.70 cm      Diastology LVIDs:         4.60 cm      LV e' medial:    4.90 cm/s LV PW:         1.40 cm      LV E/e' medial:  21.0 LV IVS:        1.00 cm      LV e' lateral:   4.90 cm/s LVOT diam:     2.00 cm      LV E/e' lateral: 21.0 LV SV:         51 LV SV Index:   28 LVOT Area:     3.14 cm  LV Volumes (MOD) LV vol d, MOD A2C: 197.0 ml LV vol d, MOD A4C: 223.0 ml LV vol s, MOD A2C: 113.0 ml LV vol s, MOD A4C: 104.0 ml LV SV MOD A2C:     84.0 ml LV SV MOD A4C:     223.0 ml LV SV MOD BP:      103.0 ml RIGHT VENTRICLE RV Basal diam:  6.00 cm RV S prime:     9.79 cm/s TAPSE (M-mode): 1.6 cm LEFT ATRIUM              Index       RIGHT ATRIUM           Index LA diam:        5.70 cm  3.15 cm/m  RA Area:     16.80 cm LA Vol (A2C):   94.0 ml  51.88 ml/m RA Volume:   39.50 ml  21.80 ml/m LA Vol (A4C):  143.0 ml 78.92 ml/m LA Biplane Vol: 121.0 ml 66.78 ml/m  AORTIC VALVE                   PULMONIC VALVE AV Area (Vmax):    1.86 cm    PV Vmax:        0.78 m/s AV Area (Vmean):   2.15 cm    PV Peak grad:   2.5 mmHg AV Area (VTI):     2.06 cm    RVOT Peak grad: 4 mmHg AV Vmax:           121.00 cm/s AV Vmean:          70.600 cm/s AV VTI:             0.248 m AV Peak Grad:      5.9 mmHg AV Mean Grad:      3.0 mmHg LVOT Vmax:         71.80 cm/s LVOT Vmean:        48.400 cm/s LVOT VTI:          0.163 m LVOT/AV VTI ratio: 0.66  AORTA Ao Root diam: 2.70 cm MITRAL VALVE                TRICUSPID VALVE MV Area (PHT): 4.04 cm     TR Peak grad:   26.8 mmHg MV Decel Time: 188 msec     TR Vmax:        259.00 cm/s MV E velocity: 103.00 cm/s MV A velocity: 55.30 cm/s   SHUNTS MV E/A ratio:  1.86         Systemic VTI:  0.16 m                             Systemic Diam: 2.00 cm Nelva Bush MD Electronically signed by Nelva Bush MD Signature Date/Time: 05/13/2021/4:59:08 PM    Final     Scheduled Meds:  amLODipine  10 mg Oral Daily   aspirin  81 mg Oral Daily   atorvastatin  80 mg Oral Daily   carvedilol  25 mg Oral BID   Chlorhexidine Gluconate Cloth  6 each Topical Q0600   cloNIDine  0.2 mg Oral BID   ferric citrate  420 mg Oral BID WC   gabapentin  100 mg Oral QHS   heparin injection (subcutaneous)  5,000 Units Subcutaneous Q8H   hydrALAZINE  50 mg Oral Q8H   insulin aspart  0-5 Units Subcutaneous QHS   insulin aspart  0-6 Units Subcutaneous TID WC   levETIRAcetam  500 mg Oral Q M,W,F   levETIRAcetam  500 mg Oral Daily   lisinopril  40 mg Oral Daily   sevelamer carbonate  800 mg Oral TID WC   spironolactone  25 mg Oral Daily   Continuous Infusions:  azithromycin Stopped (05/13/21 2338)   cefTRIAXone (ROCEPHIN)  IV Stopped (05/14/21 0008)     LOS: 2 days   Time spent: 36 minutes. More than 50% of the time was spent in counseling/coordination of care  Lorella Nimrod, MD [facility]  If 7PM-7AM, please contact night-coverage [email or web address]  05/14/2021, 3:41 PM   This record has been created using Systems analyst. Errors have been sought and corrected,but may not always be located. Such creation errors do not reflect on the standard of care.

## 2021-05-15 DIAGNOSIS — J810 Acute pulmonary edema: Secondary | ICD-10-CM | POA: Diagnosis not present

## 2021-05-15 DIAGNOSIS — E8779 Other fluid overload: Secondary | ICD-10-CM | POA: Diagnosis not present

## 2021-05-15 DIAGNOSIS — E43 Unspecified severe protein-calorie malnutrition: Secondary | ICD-10-CM

## 2021-05-15 DIAGNOSIS — R0602 Shortness of breath: Secondary | ICD-10-CM | POA: Diagnosis not present

## 2021-05-15 LAB — BASIC METABOLIC PANEL
Anion gap: 13 (ref 5–15)
BUN: 40 mg/dL — ABNORMAL HIGH (ref 6–20)
CO2: 28 mmol/L (ref 22–32)
Calcium: 8.8 mg/dL — ABNORMAL LOW (ref 8.9–10.3)
Chloride: 95 mmol/L — ABNORMAL LOW (ref 98–111)
Creatinine, Ser: 5.29 mg/dL — ABNORMAL HIGH (ref 0.61–1.24)
GFR, Estimated: 12 mL/min — ABNORMAL LOW (ref >60)
Glucose, Bld: 204 mg/dL — ABNORMAL HIGH (ref 70–99)
Potassium: 4 mmol/L (ref 3.5–5.1)
Sodium: 136 mmol/L (ref 135–145)

## 2021-05-15 LAB — GLUCOSE, CAPILLARY: Glucose-Capillary: 196 mg/dL — ABNORMAL HIGH (ref 70–99)

## 2021-05-15 MED ORDER — ENSURE ENLIVE PO LIQD: 237.0000 mL | Freq: Three times a day (TID) | ORAL | 12 refills | Status: AC

## 2021-05-15 MED ORDER — ACETAMINOPHEN 650 MG RE SUPP: 650.0000 mg | Freq: Four times a day (QID) | RECTAL | Status: DC | PRN

## 2021-05-15 MED ORDER — ACETAMINOPHEN 325 MG PO TABS
650.0000 mg | ORAL_TABLET | Freq: Four times a day (QID) | ORAL | Status: DC | PRN
  Administered 2021-05-15 (×2): 650 mg via ORAL
  Filled 2021-05-15: qty 2

## 2021-05-15 MED ORDER — ONDANSETRON HCL 4 MG/2ML IJ SOLN: 4.0000 mg | Freq: Four times a day (QID) | INTRAMUSCULAR | Status: DC | PRN

## 2021-05-15 MED ORDER — ACETAMINOPHEN 325 MG PO TABS
ORAL_TABLET | ORAL | Status: AC
  Filled 2021-05-15: qty 2

## 2021-05-15 MED ORDER — SENNOSIDES-DOCUSATE SODIUM 8.6-50 MG PO TABS: 1.0000 | ORAL_TABLET | Freq: Every evening | ORAL | Status: DC | PRN

## 2021-05-15 MED ORDER — LEVETIRACETAM 500 MG PO TABS
500.0000 mg | ORAL_TABLET | Freq: Every day | ORAL | 0 refills | Status: DC
Start: 2021-05-16 — End: 2021-08-02

## 2021-05-15 MED ORDER — ONDANSETRON HCL 4 MG PO TABS: 4.0000 mg | ORAL_TABLET | Freq: Four times a day (QID) | ORAL | Status: DC | PRN

## 2021-05-15 MED ORDER — RENA-VITE PO TABS: 1.0000 | ORAL_TABLET | Freq: Every day | ORAL | 0 refills | Status: DC

## 2021-05-15 MED ORDER — HYDRALAZINE HCL 100 MG PO TABS: 100.0000 mg | ORAL_TABLET | Freq: Three times a day (TID) | ORAL | 1 refills | Status: AC

## 2021-05-15 MED ORDER — CLONIDINE HCL 0.2 MG PO TABS: 0.2000 mg | ORAL_TABLET | Freq: Two times a day (BID) | ORAL | 11 refills | Status: DC

## 2021-05-15 MED ORDER — HYDRALAZINE HCL 50 MG PO TABS
100.0000 mg | ORAL_TABLET | Freq: Three times a day (TID) | ORAL | Status: DC
  Administered 2021-05-15: 100 mg via ORAL
  Filled 2021-05-15: qty 2

## 2021-05-15 MED ORDER — CEFDINIR 300 MG PO CAPS: 300.0000 mg | ORAL_CAPSULE | Freq: Two times a day (BID) | ORAL | 0 refills | Status: AC

## 2021-05-15 NOTE — Progress Notes (Signed)
Patient discharged to home via wheelchair, accompanied by family. Discharge teaching completed and included: new medication regimen, new prescriptions with education sheets, follow-up appointments made and reviewed, and signs and symptoms to report/return with immediately reviewed. Patient and spouse verbalize understanding and ask appropriate questions.

## 2021-05-15 NOTE — Progress Notes (Signed)
Pt. tolerated tx. without incident. Fld. removed 1.5 liter. AVF cannulated without difficulty,BFR within parameters, no excessive bleeding noted post tx. Tylenol administered per request for mild HA with relief. No concerns.

## 2021-05-15 NOTE — Discharge Summary (Signed)
Physician Discharge Summary  [name] NGE:[id number] DOB: [birth year] DOA: 05/12/2021  PCP: [name], MD  Admit date: 05/12/2021 Discharge date: 05/15/2021  Admitted From: Home Disposition: Home  Recommendations for Outpatient Follow-up:  Follow up with PCP in 1-2 weeks Follow-up with dialysis and nephrology Please obtain BMP/CBC in one week Please follow up on the following pending results: None  Home Health: Yes Equipment/Devices: Prosthetic legs Discharge Condition: Stable CODE STATUS: Full Diet recommendation: Heart Healthy / Carb Modified   Brief/Interim Summary: [name] is a 60 y.o. male with medical history significant of  ESRD MWF,HTN, DMII,Anemia,CVA, seizure disorder who presents in referral from pcp office. Patient per chart has had interim history of increase weakness with falls at home. He also over the last few days has noted intermittent sob/chest pain and cough. Patient followed with pcp for these varied complaints and on evaluation was noted to have  CXR noting fluid overload and also concern for possible consolidation. Due to these findings and patient symptoms , patient was referred to ED. There was some concern of 1 missed dialysis on Monday, according to wife he never missed any dialysis. He was found to have markedly elevated BNP above 4500 and pulmonary vascular congestion.  He was dialyzed twice during hospitalization and appears euvolemic before discharge.  He will continue with his routine dialysis. Repeat echocardiogram with EF of 30 to 35% along with reduced right ventricular systolic function, dilated chambers and IVC with less than 50% collapsibility.  There was some concern of pleural effusions.  Patient does has an history of right loculated effusion which seems stable.  There was some questionable infiltrate along with pulmonary vascular congestion on chest imaging.  He was treated with antibiotics and discharged home for 2 more days as  procalcitonin was 0.75 which can be misleading with ESRD.  Due to persistently elevated blood pressure his home dose of clonidine and hydralazine was increased.  Patient is on multiple antihypertensives and will need a close monitoring for optimization of his control.  Patient also has uncontrolled diabetes mellitus with A1c of 8.2.  Patient was not on any medications at home.  CBG remained within goal.  Patient might get benefit with low-dose insulin.  He was advised to follow-up with his primary care provider for further recommendations.  Patient remained stable and will continue rest of his home medications and follow-up with his providers.  Discharge Diagnoses:  Active Problems:   Fluid overload   Protein-calorie malnutrition, severe   Discharge Instructions  Discharge Instructions     Diet - low sodium heart healthy   Complete by: As directed    Discharge instructions   Complete by: As directed    It was pleasure taking care of you. You are being given antibiotics for 2 more days, please complete the course as directed. We increased the dose of hydralazine and clonidine because of your persistently elevated blood pressure, please start taking your new doses and follow-up very closely with your doctor for further recommendations. Keep yourself well-hydrated and continue with your dialysis according to your schedule.   Increase activity slowly   Complete by: As directed    No dressing needed   Complete by: As directed       Allergies as of 05/15/2021       Reactions   Methotrexate Other (See Comments)   Blood count drops   Vancomycin Shortness Of Breath   Eyes watering, SOB, wheezing   Cefepime Other (See Comments)  Tape         Medication List     STOP taking these medications    ciprofloxacin 500 MG tablet Commonly known as: CIPRO   cyanocobalamin 1000 MCG/ML injection Commonly known as: (VITAMIN B-12)   cyproheptadine 4 MG tablet Commonly known as:  PERIACTIN       TAKE these medications    acetaminophen 500 MG tablet Commonly known as: TYLENOL Take 1,000 mg by mouth daily as needed for moderate pain or headache.   Alcohol Swabs Pads Use as directed to check blood sugar three times daily for insulin dependent type 2 diabetes.   aspirin 81 MG chewable tablet Chew 81 mg by mouth daily.   atorvastatin 80 MG tablet Commonly known as: LIPITOR TAKE 1 TABLET(80 MG) BY MOUTH DAILY   Auryxia 1 GM 210 MG(Fe) tablet Generic drug: ferric citrate Take 420 mg by mouth in the morning and at bedtime.   carvedilol 25 MG tablet Commonly known as: COREG Take 1 tablet (25 mg total) by mouth 2 (two) times daily.   cefdinir 300 MG capsule Commonly known as: OMNICEF Take 1 capsule (300 mg total) by mouth 2 (two) times daily for 2 days.   cloNIDine 0.2 MG tablet Commonly known as: CATAPRES Take 1 tablet (0.2 mg total) by mouth 2 (two) times daily. What changed:  medication strength how much to take   feeding supplement Liqd Take 237 mLs by mouth 3 (three) times daily between meals.   furosemide 80 MG tablet Commonly known as: LASIX Take 1 tablet (80 mg total) by mouth 2 (two) times daily.   gabapentin 100 MG capsule Commonly known as: NEURONTIN Take 100 mg by mouth at bedtime. Take along with 370m capsule at bedtime What changed: Another medication with the same name was removed. Continue taking this medication, and follow the directions you see here.   [name] Pen 40 MG/0.4ML Pnkt Generic drug: Adalimumab Inject 40 mg into the muscle once a week.   hydrALAZINE 100 MG tablet Commonly known as: APRESOLINE Take 1 tablet (100 mg total) by mouth every 8 (eight) hours. What changed:  medication strength how much to take   levETIRAcetam 500 MG tablet Commonly known as: KEPPRA TAKE 1 TABLET BY MOUTH DAILY AND 1 TABLET BY MOUTH EVERY MONDAY, WEDNESDAY AND FRIDAY AFTER DIALYSIS What changed: Another medication with the same name  was changed. Make sure you understand how and when to take each.   levETIRAcetam 500 MG tablet Commonly known as: KEPPRA Take 1 tablet (500 mg total) by mouth daily. Start taking on: May 16, 2021 What changed:  medication strength how much to take when to take this   lisinopril 40 MG tablet Commonly known as: ZESTRIL Take 40 mg by mouth daily.   multivitamin Tabs tablet Take 1 tablet by mouth at bedtime.   ONE TOUCH ULTRA 2 w/Device Kit Use as directed to check blood sugar three times daily. E11.9   sevelamer carbonate 800 MG tablet Commonly known as: RENVELA TAKE 1 TABLET(800 MG) BY MOUTH THREE TIMES DAILY What changed: See the new instructions.   spironolactone 25 MG tablet Commonly known as: ALDACTONE Take 1 tablet by mouth at bedtime.       ASK your doctor about these medications    amLODipine 10 MG tablet Commonly known as: NORVASC TAKE 1 TABLET(10 MG) BY MOUTH DAILY AS NEEDED               Discharge Care Instructions  (From admission, onward)  Start     Ordered   05/15/21 0000  No dressing needed        05/15/21 1248            Follow-up Information     [name], MD. Schedule an appointment as soon as possible for a visit in 1 week(s).   Specialty: Family Medicine Contact information: [address] [place] [address] [phone]                Allergies  Allergen Reactions   Methotrexate Other (See Comments)    Blood count drops   Vancomycin Shortness Of Breath    Eyes watering, SOB, wheezing   Cefepime Other (See Comments)   Tape     Consultations: Nephrology  Procedures/Studies: DG Chest 2 View  Result Date: 05/14/2021 CLINICAL DATA:  Shortness of breath. EXAM: CHEST - 2 VIEW COMPARISON:  May 12, 2021. FINDINGS: Stable cardiomegaly is noted. Bilateral perihilar and basilar densities are noted concerning for edema or atelectasis. Stable loculated right pleural effusion is noted.  Bony thorax is unremarkable. IMPRESSION: Stable cardiomegaly. Stable bilateral perihilar and basilar edema or atelectasis. Stable loculated right pleural effusion. Electronically Signed   By: Marijo Conception M.D.   On: 05/14/2021 15:36   DG Chest 2 View  Result Date: 05/12/2021 CLINICAL DATA:  Intermittent shortness of breath. EXAM: CHEST - 2 VIEW COMPARISON:  May 12, 2021 (12:01 p.m.) FINDINGS: Stable diffusely increased interstitial lung markings are seen. Mild to moderate severity areas of atelectasis and/or infiltrate are noted within the bilateral lung bases, right greater than left. A small, stable right pleural effusion is noted. No pneumothorax is identified. The cardiac silhouette is mildly enlarged and unchanged in size. Radiopaque stents are again seen overlying the superior mediastinum and left axilla. Multiple right-sided rib deformities are seen. IMPRESSION: 1. Cardiomegaly with mild, stable interstitial edema. 2. Stable mild to moderate severity bibasilar atelectasis and/or infiltrate, right greater than left. 3. Small, stable right pleural effusion. Electronically Signed   By: Virgina Norfolk M.D.   On: 05/12/2021 20:21   DG Chest 2 View  Result Date: 05/12/2021 CLINICAL DATA:  Cough and shortness of breath. EXAM: CHEST - 2 VIEW COMPARISON:  02/06/2021 FINDINGS: Vascular stents involving the left central venous structures. Right basilar chest densities are compatible with pleural fluid and compressive atelectasis. Cannot exclude airspace disease or pneumonia at the right lung base. Slightly increased interstitial lung densities bilaterally. Heart size is upper limits of normal. IMPRESSION: 1. Right basilar chest densities are compatible with pleural fluid with atelectasis and consolidation. Cannot exclude right lower lobe pneumonia. 2. Interstitial pulmonary edema. 3. These results will be called to the ordering clinician or representative by the Radiologist Assistant, and communication  documented in the PACS or [employer]. Electronically Signed   By: Markus Daft M.D.   On: 05/12/2021 15:59   CT CHEST WO CONTRAST  Result Date: 05/13/2021 CLINICAL DATA:  Abnormal chest x-ray EXAM: CT CHEST WITHOUT CONTRAST TECHNIQUE: Multidetector CT imaging of the chest was performed following the standard protocol without IV contrast. COMPARISON:  Prior CT scan of the chest 05/19/2017 FINDINGS: Cardiovascular: Limited evaluation in the absence of intravenous contrast. Metallic stents are present in the left innominate vein and left axillary vein. The main pulmonary artery is enlarged at nearly form cm in diameter. The aorta is normal in caliber. Atherosclerotic calcifications noted in the aorta, branch arteries and throughout the coronary arteries. Marked cardiomegaly. The intracardiac blood pool  is hypodense relative to the adjacent myocardium consistent with anemia. No pericardial effusion. Mediastinum/Nodes: Limited evaluation in the absence of intravenous contrast and asthenic neck body habitus with minimal fat planes to separate the soft tissue density structures of the mediastinum. Left hilar lymph node measures 1 cm in short axis slightly larger when compared with prior imaging. Ill-defined soft tissue thickening throughout the right paratracheal and right perihilar space very poorly evaluated without intravenous contrast. Correlation with prior CT imaging demonstrates evidence of prior mediastinal abscess in these locations. This may represent residual scarring. Lungs/Pleura: Interlobular septal thickening. Diffuse bronchial wall thickening throughout both lungs. Debris present within the distal trachea and right mainstem bronchus likely representing secretions. Ground-glass attenuation airspace opacities with areas of more confluence noted in the right perihilar lung and extending along the Peri bronchovascular tree. Small right pleural effusion with thickened and calcified pleural rind. Upper  Abdomen: Severe calcifications of visualized intra-abdominal vessels. Bilateral renal atrophy. No acute abnormality. Musculoskeletal: No acute fracture or aggressive appearing lytic or blastic osseous lesion. IMPRESSION: 1. Very limited evaluation in the absence of intravenous contrast, particularly given patient's very thin body habitus. Without fat planes to separate the similar density soft tissue structures within the mediastinum resolution is quite limited. 2. Cardiomegaly with pulmonary edema most consistent with CHF. 3. Changes are asymmetric and more impressive in the right perihilar region. In the appropriate clinical setting, a superimposed bronchopneumonia is a consideration. Malignancy is also possible, but considered less likely. Consider repeat CT scan of the chest with intravenous contrast after treatment of patient's presumed CHF/volume overload. 4. Small volume of secretions present within the distal trachea and right mainstem bronchus. 5. Probable chronic ill-defined soft tissue thickening within the right paratracheal mediastinum and right perihilar mediastinum may be secondary to prior mediastinal abscesses as seen on prior imaging from August of 2018. 6. Similarly, chronic small right-sided pleural effusion with thickened and calcified pleural rind is also likely residua from the event in 2018. 7. Pulmonary arterial enlargement suggests underlying pulmonary arterial hypertension. 8. The intracardiac blood pool is hypodense relative to the adjacent myocardium consistent with anemia. 9. Aortic and advanced coronary artery atherosclerotic calcifications. 10. Metallic stents in the left brachiocephalic and axillary veins likely related to hemodialysis access. Aortic Atherosclerosis (ICD10-I70.0). Electronically Signed   By: Jacqulynn Cadet M.D.   On: 05/13/2021 06:29   ECHOCARDIOGRAM COMPLETE  Result Date: 05/13/2021    ECHOCARDIOGRAM REPORT   Patient Name:   [name] Date of Exam:  05/13/2021 Medical Rec #:  [id number]       Height:       71.0 in Accession #:    [id number]      Weight:       140.0 lb Date of Birth:  1960-11-24       BSA:          1.812 m Patient Age:    71 years        BP:           152/71 mmHg Patient Gender: M               HR:           63 bpm. Exam Location:  [facility] Procedure: 2D Echo, Cardiac Doppler and Color Doppler Indications:     CHF-acute systolic J03.00  History:         Patient has prior history of Echocardiogram examinations, most  recent 09/13/2020. Risk Factors:Hypertension and Diabetes.  Sonographer:     Sherrie Sport RDCS (AE) Referring Phys:  [id number] Victoriano Lain A THOMAS Diagnosing Phys: Nelva Bush MD IMPRESSIONS  1. Left ventricular ejection fraction, by estimation, is 30 to 35%. The left ventricle has moderately decreased function. The left ventricle demonstrates global hypokinesis. The left ventricular internal cavity size was mildly dilated. There is mild left ventricular hypertrophy. Left ventricular diastolic parameters are consistent with Grade II diastolic dysfunction (pseudonormalization). Elevated left atrial pressure.  2. Right ventricular systolic function is moderately reduced. The right ventricular size is moderately enlarged. There is normal pulmonary artery systolic pressure.  3. Left atrial size was severely dilated.  4. Large pleural effusion in the left lateral region.  5. The mitral valve is abnormal. Mild to moderate mitral valve regurgitation. No evidence of mitral stenosis. Moderate mitral annular calcification.  6. Tricuspid valve regurgitation is moderate.  7. The aortic valve is tricuspid. There is moderate calcification of the aortic valve. There is moderate thickening of the aortic valve. Aortic valve regurgitation is not visualized. Mild to moderate aortic valve sclerosis/calcification is present, without any evidence of aortic stenosis.  8. The inferior vena cava is normal in size with <50% respiratory variability,  suggesting right atrial pressure of 8 mmHg. FINDINGS  Left Ventricle: Left ventricular ejection fraction, by estimation, is 30 to 35%. The left ventricle has moderately decreased function. The left ventricle demonstrates global hypokinesis. The left ventricular internal cavity size was mildly dilated. There is mild left ventricular hypertrophy. Left ventricular diastolic parameters are consistent with Grade II diastolic dysfunction (pseudonormalization). Elevated left atrial pressure. Right Ventricle: The right ventricular size is moderately enlarged. No increase in right ventricular wall thickness. Right ventricular systolic function is moderately reduced. There is normal pulmonary artery systolic pressure. The tricuspid regurgitant velocity is 2.59 m/s, and with an assumed right atrial pressure of 8 mmHg, the estimated right ventricular systolic pressure is 35.7 mmHg. Left Atrium: Left atrial size was severely dilated. Right Atrium: Right atrial size was normal in size. Pericardium: There is no evidence of pericardial effusion. Mitral Valve: The mitral valve is abnormal. There is mild thickening of the mitral valve leaflet(s). Moderate mitral annular calcification. Mild to moderate mitral valve regurgitation. No evidence of mitral valve stenosis. Tricuspid Valve: The tricuspid valve is grossly normal. Tricuspid valve regurgitation is moderate. Aortic Valve: The aortic valve is tricuspid. There is moderate calcification of the aortic valve. There is moderate thickening of the aortic valve. Aortic valve regurgitation is not visualized. Mild to moderate aortic valve sclerosis/calcification is present, without any evidence of aortic stenosis. Aortic valve mean gradient measures 3.0 mmHg. Aortic valve peak gradient measures 5.9 mmHg. Aortic valve area, by VTI measures 2.06 cm. Pulmonic Valve: The pulmonic valve was grossly normal. Pulmonic valve regurgitation is not visualized. No evidence of pulmonic stenosis. Aorta:  The aortic root is normal in size and structure. Pulmonary Artery: The pulmonary artery is of normal size. Venous: The inferior vena cava is normal in size with less than 50% respiratory variability, suggesting right atrial pressure of 8 mmHg. IAS/Shunts: There is right bowing of the interatrial septum, suggestive of elevated left atrial pressure. The interatrial septum was not well visualized. Additional Comments: There is a large pleural effusion in the left lateral region.  LEFT VENTRICLE PLAX 2D LVIDd:         5.70 cm      Diastology LVIDs:         4.60 cm  LV e' medial:    4.90 cm/s LV PW:         1.40 cm      LV E/e' medial:  21.0 LV IVS:        1.00 cm      LV e' lateral:   4.90 cm/s LVOT diam:     2.00 cm      LV E/e' lateral: 21.0 LV SV:         51 LV SV Index:   28 LVOT Area:     3.14 cm  LV Volumes (MOD) LV vol d, MOD A2C: 197.0 ml LV vol d, MOD A4C: 223.0 ml LV vol s, MOD A2C: 113.0 ml LV vol s, MOD A4C: 104.0 ml LV SV MOD A2C:     84.0 ml LV SV MOD A4C:     223.0 ml LV SV MOD BP:      103.0 ml RIGHT VENTRICLE RV Basal diam:  6.00 cm RV S prime:     9.79 cm/s TAPSE (M-mode): 1.6 cm LEFT ATRIUM              Index       RIGHT ATRIUM           Index LA diam:        5.70 cm  3.15 cm/m  RA Area:     16.80 cm LA Vol (A2C):   94.0 ml  51.88 ml/m RA Volume:   39.50 ml  21.80 ml/m LA Vol (A4C):   143.0 ml 78.92 ml/m LA Biplane Vol: 121.0 ml 66.78 ml/m  AORTIC VALVE                   PULMONIC VALVE AV Area (Vmax):    1.86 cm    PV Vmax:        0.78 m/s AV Area (Vmean):   2.15 cm    PV Peak grad:   2.5 mmHg AV Area (VTI):     2.06 cm    RVOT Peak grad: 4 mmHg AV Vmax:           121.00 cm/s AV Vmean:          70.600 cm/s AV VTI:            0.248 m AV Peak Grad:      5.9 mmHg AV Mean Grad:      3.0 mmHg LVOT Vmax:         71.80 cm/s LVOT Vmean:        48.400 cm/s LVOT VTI:          0.163 m LVOT/AV VTI ratio: 0.66  AORTA Ao Root diam: 2.70 cm MITRAL VALVE                TRICUSPID VALVE MV Area (PHT):  4.04 cm     TR Peak grad:   26.8 mmHg MV Decel Time: 188 msec     TR Vmax:        259.00 cm/s MV E velocity: 103.00 cm/s MV A velocity: 55.30 cm/s   SHUNTS MV E/A ratio:  1.86         Systemic VTI:  0.16 m                             Systemic Diam: 2.00 cm Nelva Bush MD Electronically signed by Nelva Bush MD Signature Date/Time: 05/13/2021/4:59:08 PM    Final  Subjective: Patient was seen and examined today.  Getting ready to go for dialysis.  No new complaints.  Discharge Exam: Vitals:   05/15/21 1145 05/15/21 1200  BP: (!) 160/74 (!) 157/77  Pulse: 66 60  Resp: 17 15  Temp:    SpO2:     Vitals:   05/15/21 1115 05/15/21 1130 05/15/21 1145 05/15/21 1200  BP: (!) 172/82 (!) 162/135 (!) 160/74 (!) 157/77  Pulse: 73 73 66 60  Resp: (!) _0 Temp:      TempSrc:      SpO2:      Weight:      Height:        General: Pt is alert, awake, not in acute distress Cardiovascular: RRR, S1/S2 +, no rubs, no gallops Respiratory: CTA bilaterally, no wheezing, no rhonchi Abdominal: Soft, NT, ND, bowel sounds + Extremities: no edema, no cyanosis, bilateral BKA   The results of significant diagnostics from this hospitalization (including imaging, microbiology, ancillary and laboratory) are listed below for reference.    Microbiology: Recent Results (from the past 240 hour(s))  Resp Panel by RT-PCR (Flu A&B, Covid) Nasopharyngeal Swab     Status: None   Collection Time: 05/12/21  9:03 PM   Specimen: Nasopharyngeal Swab; Nasopharyngeal(NP) swabs in vial transport medium  Result Value Ref Range Status   SARS Coronavirus 2 by RT PCR NEGATIVE NEGATIVE Final    Comment: (NOTE) SARS-CoV-2 target nucleic acids are NOT DETECTED.  The SARS-CoV-2 RNA is generally detectable in upper respiratory specimens during the acute phase of infection. The lowest concentration of SARS-CoV-2 viral copies this assay can detect is 138 copies/mL. A negative result does not preclude  SARS-Cov-2 infection and should not be used as the sole basis for treatment or other patient management decisions. A negative result may occur with  improper specimen collection/handling, submission of specimen other than nasopharyngeal swab, presence of viral mutation(s) within the areas targeted by this assay, and inadequate number of viral copies(<138 copies/mL). A negative result must be combined with clinical observations, patient history, and epidemiological information. The expected result is Negative.  Fact Sheet for Patients:  EntrepreneurPulse.com.au  Fact Sheet for Healthcare Providers:  IncredibleEmployment.be  This test is no t yet approved or cleared by the [place] FDA and  has been authorized for detection and/or diagnosis of SARS-CoV-2 by FDA under an Emergency Use Authorization (EUA). This EUA will remain  in effect (meaning this test can be used) for the duration of the COVID-19 declaration under Section 564(b)(1) of the Act, 21 U.S.C.section 360bbb-3(b)(1), unless the authorization is terminated  or revoked sooner.       Influenza A by PCR NEGATIVE NEGATIVE Final   Influenza B by PCR NEGATIVE NEGATIVE Final    Comment: (NOTE) The Xpert Xpress SARS-CoV-2/FLU/RSV plus assay is intended as an aid in the diagnosis of influenza from Nasopharyngeal swab specimens and should not be used as a sole basis for treatment. Nasal washings and aspirates are unacceptable for Xpert Xpress SARS-CoV-2/FLU/RSV testing.  Fact Sheet for Patients: EntrepreneurPulse.com.au  Fact Sheet for Healthcare Providers: IncredibleEmployment.be  This test is not yet approved or cleared by the [place] FDA and has been authorized for detection and/or diagnosis of SARS-CoV-2 by FDA under an Emergency Use Authorization (EUA). This EUA will remain in effect (meaning this test can be used) for the duration of  the COVID-19 declaration under Section 564(b)(1) of the Act, 21 U.S.C. section 360bbb-3(b)(1), unless the authorization is terminated or  revoked.  Performed at [facility], [address] [place], [place] [address]   MRSA Next Gen by PCR, Nasal     Status: None   Collection Time: 05/12/21 11:06 PM   Specimen: Nasal Mucosa; Nasal Swab  Result Value Ref Range Status   MRSA by PCR Next Gen NOT DETECTED NOT DETECTED Final    Comment: (NOTE) The GeneXpert MRSA Assay (FDA approved for NASAL specimens only), is one component of a comprehensive MRSA colonization surveillance program. It is not intended to diagnose MRSA infection nor to guide or monitor treatment for MRSA infections. Test performance is not FDA approved in patients less than 24 years old. Performed at [facility], [address] [place], [place] [address]   Culture, blood (routine x 2) Call MD if unable to obtain prior to antibiotics being given     Status: None (Preliminary result)   Collection Time: 05/12/21 11:54 PM   Specimen: BLOOD  Result Value Ref Range Status   Specimen Description BLOOD RIGHT FOREARM  Final   Special Requests   Final    BOTTLES DRAWN AEROBIC ONLY Blood Culture results may not be optimal due to an inadequate volume of blood received in culture bottles   Culture   Final    NO GROWTH 1 DAY Performed at [facility], [address] [place], [place] [address]    Report Status PENDING  Incomplete     Labs: BNP (last 3 results) Recent Labs    05/11/21 1628 05/12/21 1928  BNP 3,779.1* >6,295.2*   Basic Metabolic Panel: Recent Labs  Lab 05/11/21 1628 05/12/21 1928 05/15/21 0649  NA 140 136 136  K 3.3* 3.3* 4.0  CL 96 95* 95*  CO2 31* 30 28  GLUCOSE 217* 170* 204*  BUN 22 37* 40*  CREATININE 3.63* 5.49* 5.29*  CALCIUM 8.2* 8.2* 8.8*   Liver Function Tests: Recent Labs  Lab 05/11/21 1628  AST 14  ALT 4  ALKPHOS 79  BILITOT 0.5  PROT 7.0   ALBUMIN 3.3*   No results for input(s): LIPASE, AMYLASE in the last 168 hours. No results for input(s): AMMONIA in the last 168 hours. CBC: Recent Labs  Lab 05/11/21 1628 05/12/21 1928  WBC 5.8 6.2  HGB 8.0* 8.2*  HCT 25.8* 26.8*  MCV 79 83.0  PLT 136* 154   Cardiac Enzymes: No results for input(s): CKTOTAL, CKMB, CKMBINDEX, TROPONINI in the last 168 hours. BNP: Invalid input(s): POCBNP CBG: Recent Labs  Lab 05/14/21 0807 05/14/21 1211 05/14/21 1618 05/14/21 2116 05/15/21 0816  GLUCAP 139* 153* 150* 166* 196*   D-Dimer No results for input(s): DDIMER in the last 72 hours. Hgb A1c No results for input(s): HGBA1C in the last 72 hours. Lipid Profile No results for input(s): CHOL, HDL, LDLCALC, TRIG, CHOLHDL, LDLDIRECT in the last 72 hours. Thyroid function studies No results for input(s): TSH, T4TOTAL, T3FREE, THYROIDAB in the last 72 hours.  Invalid input(s): FREET3 Anemia work up No results for input(s): VITAMINB12, FOLATE, FERRITIN, TIBC, IRON, RETICCTPCT in the last 72 hours. Urinalysis    Component Value Date/Time   COLORURINE YELLOW (A) 02/06/2021 2041   APPEARANCEUR CLOUDY (A) 02/06/2021 2041   LABSPEC 1.024 02/06/2021 2041   PHURINE 7.0 02/06/2021 2041   GLUCOSEU 150 (A) 02/06/2021 2041   HGBUR MODERATE (A) 02/06/2021 2041   BILIRUBINUR NEGATIVE 02/06/2021 2041   KETONESUR NEGATIVE 02/06/2021 2041   PROTEINUR >=300 (A) 02/06/2021 2041   NITRITE NEGATIVE 02/06/2021 2041  LEUKOCYTESUR NEGATIVE 02/06/2021 2041   Sepsis Labs Invalid input(s): PROCALCITONIN,  WBC,  LACTICIDVEN Microbiology Recent Results (from the past 240 hour(s))  Resp Panel by RT-PCR (Flu A&B, Covid) Nasopharyngeal Swab     Status: None   Collection Time: 05/12/21  9:03 PM   Specimen: Nasopharyngeal Swab; Nasopharyngeal(NP) swabs in vial transport medium  Result Value Ref Range Status   SARS Coronavirus 2 by RT PCR NEGATIVE NEGATIVE Final    Comment: (NOTE) SARS-CoV-2 target  nucleic acids are NOT DETECTED.  The SARS-CoV-2 RNA is generally detectable in upper respiratory specimens during the acute phase of infection. The lowest concentration of SARS-CoV-2 viral copies this assay can detect is 138 copies/mL. A negative result does not preclude SARS-Cov-2 infection and should not be used as the sole basis for treatment or other patient management decisions. A negative result may occur with  improper specimen collection/handling, submission of specimen other than nasopharyngeal swab, presence of viral mutation(s) within the areas targeted by this assay, and inadequate number of viral copies(<138 copies/mL). A negative result must be combined with clinical observations, patient history, and epidemiological information. The expected result is Negative.  Fact Sheet for Patients:  EntrepreneurPulse.com.au  Fact Sheet for Healthcare Providers:  IncredibleEmployment.be  This test is no t yet approved or cleared by the [place] FDA and  has been authorized for detection and/or diagnosis of SARS-CoV-2 by FDA under an Emergency Use Authorization (EUA). This EUA will remain  in effect (meaning this test can be used) for the duration of the COVID-19 declaration under Section 564(b)(1) of the Act, 21 U.S.C.section 360bbb-3(b)(1), unless the authorization is terminated  or revoked sooner.       Influenza A by PCR NEGATIVE NEGATIVE Final   Influenza B by PCR NEGATIVE NEGATIVE Final    Comment: (NOTE) The Xpert Xpress SARS-CoV-2/FLU/RSV plus assay is intended as an aid in the diagnosis of influenza from Nasopharyngeal swab specimens and should not be used as a sole basis for treatment. Nasal washings and aspirates are unacceptable for Xpert Xpress SARS-CoV-2/FLU/RSV testing.  Fact Sheet for Patients: EntrepreneurPulse.com.au  Fact Sheet for Healthcare  Providers: IncredibleEmployment.be  This test is not yet approved or cleared by the [place] FDA and has been authorized for detection and/or diagnosis of SARS-CoV-2 by FDA under an Emergency Use Authorization (EUA). This EUA will remain in effect (meaning this test can be used) for the duration of the COVID-19 declaration under Section 564(b)(1) of the Act, 21 U.S.C. section 360bbb-3(b)(1), unless the authorization is terminated or revoked.  Performed at [facility] [address] [place], [place] [address]   MRSA Next Gen by PCR, Nasal     Status: None   Collection Time: 05/12/21 11:06 PM   Specimen: Nasal Mucosa; Nasal Swab  Result Value Ref Range Status   MRSA by PCR Next Gen NOT DETECTED NOT DETECTED Final    Comment: (NOTE) The GeneXpert MRSA Assay (FDA approved for NASAL specimens only), is one component of a comprehensive MRSA colonization surveillance program. It is not intended to diagnose MRSA infection nor to guide or monitor treatment for MRSA infections. Test performance is not FDA approved in patients less than 77 years old. Performed at [facility], [address] [place], [place] [address]   Culture, blood (routine x 2) Call MD if unable to obtain prior to antibiotics being given     Status: None (Preliminary result)   Collection Time: 05/12/21 11:54 PM   Specimen: BLOOD  Result  Value Ref Range Status   Specimen Description BLOOD RIGHT FOREARM  Final   Special Requests   Final    BOTTLES DRAWN AEROBIC ONLY Blood Culture results may not be optimal due to an inadequate volume of blood received in culture bottles   Culture   Final    NO GROWTH 1 DAY Performed at [facility], [address] [place], [place] [address]    Report Status PENDING  Incomplete    Time coordinating discharge: Over 30 minutes  SIGNED:  Lorella Nimrod, MD  [facility] 05/15/2021, 12:50 PM  If 7PM-7AM, please contact  night-coverage [email or web address]  This record has been created using Systems analyst. Errors have been sought and corrected,but may not always be located. Such creation errors do not reflect on the standard of care.

## 2021-05-15 NOTE — Progress Notes (Signed)
Hemodialysis patient known at [facility] MWF 11:15am, patient uses [facility] for transportation. Patient stated no dialysis concerns. Please contact me with any dialysis placement concerns.  Elvera Bicker Dialysis Coordinator [phone]

## 2021-05-15 NOTE — TOC Transition Note (Signed)
Transition of Care [facility]) - CM/SW Discharge Note   Patient Details  Name: [name] MRN: [id number] Date of Birth: [birth year]  Transition of Care [facility]) CM/SW Contact:  Alberteen Sam, LCSW Phone Number: 05/15/2021, 3:39 PM   Clinical Narrative:     Patient will DC to: home Anticipated DC date: 05/15/21 Family notified: spouse [name]   Per MD patient ready for DC to home with [place] service, no other dc needs identified at this time.  CSW signing off.  Pricilla Riffle, LCSW    Final next level of care: [place] Barriers to Discharge: No Barriers Identified   Patient Goals and CMS Choice Patient states their goals for this hospitalization and ongoing recovery are:: to go home CMS Medicare.gov Compare Post Acute Care list provided to:: Patient Represenative (must comment) (spouse [name]) Choice offered to / list presented to : Spouse  Discharge Placement                    Patient and family notified of of transfer: 05/15/21  Discharge Plan and Services     Post Acute Care Choice: Home Health                    HH Arranged: PT, RN, Nurse's Aide [facility] Agency: [place] ([place]) Date HH Agency Contacted: 05/15/21 Time [place]: [place] Representative spoke with at [place]: [place] ([place]) Interventions     Readmission Risk Interventions Readmission Risk Prevention Plan 02/10/2021 02/08/2021 11/21/2020  Transportation Screening Complete Complete Complete  PCP or Specialist Appt within 5-7 Days - Complete -  Home Care Screening - Complete -  Medication Review (RN CM) - Complete -  HRI or Home Care Consult Complete - -  Palliative Care Screening Complete - -  Medication Review (RN Care Manager) Complete - -  Some recent data might be hidden

## 2021-05-15 NOTE — Care Management Important Message (Signed)
Important Message  Patient Details  Name: [name] MRN: [id number] Date of Birth: [birth year]   Medicare Important Message Given:  Yes     Juliann Pulse A Neilan Rizzo 05/15/2021, 12:24 PM

## 2021-05-15 NOTE — Progress Notes (Signed)
[facility]  ROUNDING NOTE   Subjective:   [name] is a 60 y.o. male with past medical history of anemia, diabetes, encephalopathy, hypertension,stroke and ESRD on dialysis. Patient presented to ED with shortness of breath and cough. He has been admitted to [facility] for Shortness of breath [R06.02] Acute pulmonary edema ([place]) [J81.0] Fluid overload [E87.70] Other hypervolemia [E87.79]   Patient receives his dialysis treatments at [facility] supervised by [facility]. We have been consulted to manage dialysis needs during this admission.   Patient seen sitting on bedside States he had some shortness of breath over night Tolerating meals Denies nausea and vomiting  Patient seen during dialysis   HEMODIALYSIS FLOWSHEET:  Blood Flow Rate (mL/min): 400 mL/min Arterial Pressure (mmHg): -110 mmHg Venous Pressure (mmHg): 90 mmHg Transmembrane Pressure (mmHg): 80 mmHg Ultrafiltration Rate (mL/min): 670 mL/min Dialysate Flow Rate (mL/min): 500 ml/min Conductivity: Machine : 13.9 Conductivity: Machine : 13.9 Dialysis Fluid Bolus: Normal Saline Bolus Amount (mL): 300 mL  Objective:  Vital signs in last 24 hours:  Temp:  [97.4 F (36.3 C)-97.8 F (36.6 C)] 97.4 F (36.3 C) (07/29 0837) Pulse Rate:  [57-84] 64 (07/29 1315) Resp:  [15-23] 15 (07/29 1200) BP: (152-189)/(73-135) 168/79 (07/29 1315) SpO2:  [95 %-100 %] 100 % (07/29 1100) Weight:  [62.5 kg] 62.5 kg (07/29 0521)  Weight change: 2.16 kg Filed Weights   05/12/21 1924 05/14/21 0228 05/15/21 0521  Weight: 63.5 kg 60.3 kg 62.5 kg    Intake/Output: I/O last 3 completed shifts: In: 1369.4 [P.O.:320; IV Piggyback:1049.4] Out: -    Intake/Output this shift:  No intake/output data recorded.  Physical Exam: General: NAD, sitting up on bedside  Head: Normocephalic, atraumatic. Moist oral mucosal membranes  Eyes: Anicteric  Lungs:  Bilateral crackles, normal breathing effort, [place] O2  Heart: Regular rate  and rhythm  Abdomen:  Soft, nontender  Extremities:  1+ peripheral edema.  Neurologic: Nonfocal, moving all four extremities  Skin: No lesions  Access: LUE AVF    Basic Metabolic Panel: Recent Labs  Lab 05/11/21 1628 05/12/21 1928 05/15/21 0649  NA 140 136 136  K 3.3* 3.3* 4.0  CL 96 95* 95*  CO2 31* 30 28  GLUCOSE 217* 170* 204*  BUN 22 37* 40*  CREATININE 3.63* 5.49* 5.29*  CALCIUM 8.2* 8.2* 8.8*     Liver Function Tests: Recent Labs  Lab 05/11/21 1628  AST 14  ALT 4  ALKPHOS 79  BILITOT 0.5  PROT 7.0  ALBUMIN 3.3*    No results for input(s): LIPASE, AMYLASE in the last 168 hours. No results for input(s): AMMONIA in the last 168 hours.  CBC: Recent Labs  Lab 05/11/21 1628 05/12/21 1928  WBC 5.8 6.2  HGB 8.0* 8.2*  HCT 25.8* 26.8*  MCV 79 83.0  PLT 136* 154     Cardiac Enzymes: No results for input(s): CKTOTAL, CKMB, CKMBINDEX, TROPONINI in the last 168 hours.  BNP: Invalid input(s): POCBNP  CBG: Recent Labs  Lab 05/14/21 0807 05/14/21 1211 05/14/21 1618 05/14/21 2116 05/15/21 0816  GLUCAP 139* 153* 150* 166* 196*     Microbiology: Results for orders placed or performed during the hospital encounter of 05/12/21  Resp Panel by RT-PCR (Flu A&B, Covid) Nasopharyngeal Swab     Status: None   Collection Time: 05/12/21  9:03 PM   Specimen: Nasopharyngeal Swab; Nasopharyngeal(NP) swabs in vial transport medium  Result Value Ref Range Status   SARS Coronavirus 2 by RT PCR NEGATIVE NEGATIVE  Final    Comment: (NOTE) SARS-CoV-2 target nucleic acids are NOT DETECTED.  The SARS-CoV-2 RNA is generally detectable in upper respiratory specimens during the acute phase of infection. The lowest concentration of SARS-CoV-2 viral copies this assay can detect is 138 copies/mL. A negative result does not preclude SARS-Cov-2 infection and should not be used as the sole basis for treatment or other patient management decisions. A negative result may occur  with  improper specimen collection/handling, submission of specimen other than nasopharyngeal swab, presence of viral mutation(s) within the areas targeted by this assay, and inadequate number of viral copies(<138 copies/mL). A negative result must be combined with clinical observations, patient history, and epidemiological information. The expected result is Negative.  Fact Sheet for Patients:  EntrepreneurPulse.com.au  Fact Sheet for Healthcare Providers:  IncredibleEmployment.be  This test is no t yet approved or cleared by the [place] FDA and  has been authorized for detection and/or diagnosis of SARS-CoV-2 by FDA under an Emergency Use Authorization (EUA). This EUA will remain  in effect (meaning this test can be used) for the duration of the COVID-19 declaration under Section 564(b)(1) of the Act, 21 U.S.C.section 360bbb-3(b)(1), unless the authorization is terminated  or revoked sooner.       Influenza A by PCR NEGATIVE NEGATIVE Final   Influenza B by PCR NEGATIVE NEGATIVE Final    Comment: (NOTE) The Xpert Xpress SARS-CoV-2/FLU/RSV plus assay is intended as an aid in the diagnosis of influenza from Nasopharyngeal swab specimens and should not be used as a sole basis for treatment. Nasal washings and aspirates are unacceptable for Xpert Xpress SARS-CoV-2/FLU/RSV testing.  Fact Sheet for Patients: EntrepreneurPulse.com.au  Fact Sheet for Healthcare Providers: IncredibleEmployment.be  This test is not yet approved or cleared by the [place] FDA and has been authorized for detection and/or diagnosis of SARS-CoV-2 by FDA under an Emergency Use Authorization (EUA). This EUA will remain in effect (meaning this test can be used) for the duration of the COVID-19 declaration under Section 564(b)(1) of the Act, 21 U.S.C. section 360bbb-3(b)(1), unless the authorization is terminated  or revoked.  Performed at [facility], [address] [place], [place] [address]   MRSA Next Gen by PCR, Nasal     Status: None   Collection Time: 05/12/21 11:06 PM   Specimen: Nasal Mucosa; Nasal Swab  Result Value Ref Range Status   MRSA by PCR Next Gen NOT DETECTED NOT DETECTED Final    Comment: (NOTE) The GeneXpert MRSA Assay (FDA approved for NASAL specimens only), is one component of a comprehensive MRSA colonization surveillance program. It is not intended to diagnose MRSA infection nor to guide or monitor treatment for MRSA infections. Test performance is not FDA approved in patients less than 70 years old. Performed at [facility], [address] [place], [place] [address]   Culture, blood (routine x 2) Call MD if unable to obtain prior to antibiotics being given     Status: None (Preliminary result)   Collection Time: 05/12/21 11:54 PM   Specimen: BLOOD  Result Value Ref Range Status   Specimen Description BLOOD RIGHT FOREARM  Final   Special Requests   Final    BOTTLES DRAWN AEROBIC ONLY Blood Culture results may not be optimal due to an inadequate volume of blood received in culture bottles   Culture   Final    NO GROWTH 1 DAY Performed at [facility], [address] [place], [place] [address]    Report  Status PENDING  Incomplete    Coagulation Studies: No results for input(s): LABPROT, INR in the last 72 hours.  Urinalysis: No results for input(s): COLORURINE, LABSPEC, PHURINE, GLUCOSEU, HGBUR, BILIRUBINUR, KETONESUR, PROTEINUR, UROBILINOGEN, NITRITE, LEUKOCYTESUR in the last 72 hours.  Invalid input(s): APPERANCEUR    Imaging: DG Chest 2 View  Result Date: 05/14/2021 CLINICAL DATA:  Shortness of breath. EXAM: CHEST - 2 VIEW COMPARISON:  May 12, 2021. FINDINGS: Stable cardiomegaly is noted. Bilateral perihilar and basilar densities are noted concerning for edema or atelectasis. Stable loculated right pleural effusion is  noted. Bony thorax is unremarkable. IMPRESSION: Stable cardiomegaly. Stable bilateral perihilar and basilar edema or atelectasis. Stable loculated right pleural effusion. Electronically Signed   By: Marijo Conception M.D.   On: 05/14/2021 15:36     Medications:    cefTRIAXone (ROCEPHIN)  IV Stopped (05/15/21 0124)    acetaminophen       amLODipine  10 mg Oral Daily   aspirin  81 mg Oral Daily   atorvastatin  80 mg Oral Daily   carvedilol  25 mg Oral BID   Chlorhexidine Gluconate Cloth  6 each Topical Q0600   cloNIDine  0.2 mg Oral BID   feeding supplement  237 mL Oral TID BM   ferric citrate  420 mg Oral BID WC   gabapentin  100 mg Oral QHS   heparin injection (subcutaneous)  5,000 Units Subcutaneous Q8H   hydrALAZINE  100 mg Oral Q8H   insulin aspart  0-5 Units Subcutaneous QHS   insulin aspart  0-6 Units Subcutaneous TID WC   levETIRAcetam  500 mg Oral Q M,W,F   levETIRAcetam  500 mg Oral Daily   lisinopril  40 mg Oral Daily   multivitamin  1 tablet Oral QHS   multivitamin with minerals  1 tablet Oral Daily   sevelamer carbonate  800 mg Oral TID WC   spironolactone  25 mg Oral Daily   acetaminophen **OR** acetaminophen, albuterol, ondansetron **OR** ondansetron (ZOFRAN) IV, senna-docusate  Assessment/ Plan:  Mr. Mervin Ramires [place] is a 60 y.o.  male past medical history of anemia, diabetes, encephalopathy, hypertension,stroke and ESRD on dialysis. Patient presented to ED with shortness of breath and cough. He has been admitted to [facility] for Shortness of breath [R06.02] Acute pulmonary edema ([place]) [J81.0] Fluid overload [E87.70] Other hypervolemia [E87.79]   [facility]/MWF/LUE AVF  End stage renal disease on dialysis  Will maintain outpatient schedule, if possible Scheduled to receive dialysis today Cleared to discharge after dialysis from renal stance.   2. Anemia of chronic kidney disease  Lab Results  Component Value Date   HGB 8.2 (L) 05/12/2021   Hgb below  goal EPO may be discussed outpatient  3. Secondary Hyperparathyroidism:  Lab Results  Component Value Date   PTH 283 (H) 01/08/2019   CALCIUM 8.8 (L) 05/15/2021   CAION 1.10 (L) 09/12/2020   PHOS 3.7 11/21/2020  [place] outpatient  Phosphorus at goal [place] with meals    LOS: 3   7/29/20221:53 PM

## 2021-05-18 ENCOUNTER — Emergency Department: Payer: Medicare Other

## 2021-05-18 ENCOUNTER — Inpatient Hospital Stay
Admission: EM | Admit: 2021-05-18 | Discharge: 2021-05-26 | DRG: 189 | Disposition: A | Discharge status: Skilled Nursing Facility | Payer: Medicare Other | Attending: Internal Medicine | Admitting: Internal Medicine

## 2021-05-18 ENCOUNTER — Observation Stay: Payer: Medicare Other

## 2021-05-18 ENCOUNTER — Other Ambulatory Visit: Payer: Self-pay

## 2021-05-18 DIAGNOSIS — Z992 Dependence on renal dialysis: Secondary | ICD-10-CM | POA: Diagnosis not present

## 2021-05-18 DIAGNOSIS — G40909 Epilepsy, unspecified, not intractable, without status epilepticus: Secondary | ICD-10-CM

## 2021-05-18 DIAGNOSIS — Z89511 Acquired absence of right leg below knee: Secondary | ICD-10-CM

## 2021-05-18 DIAGNOSIS — E1122 Type 2 diabetes mellitus with diabetic chronic kidney disease: Secondary | ICD-10-CM | POA: Diagnosis present

## 2021-05-18 DIAGNOSIS — Z86718 Personal history of other venous thrombosis and embolism: Secondary | ICD-10-CM

## 2021-05-18 DIAGNOSIS — L732 Hidradenitis suppurativa: Secondary | ICD-10-CM | POA: Diagnosis present

## 2021-05-18 DIAGNOSIS — I509 Heart failure, unspecified: Secondary | ICD-10-CM

## 2021-05-18 DIAGNOSIS — J9621 Acute and chronic respiratory failure with hypoxia: Secondary | ICD-10-CM | POA: Diagnosis not present

## 2021-05-18 DIAGNOSIS — I5022 Chronic systolic (congestive) heart failure: Secondary | ICD-10-CM | POA: Diagnosis present

## 2021-05-18 DIAGNOSIS — J189 Pneumonia, unspecified organism: Secondary | ICD-10-CM | POA: Diagnosis present

## 2021-05-18 DIAGNOSIS — J9601 Acute respiratory failure with hypoxia: Secondary | ICD-10-CM | POA: Diagnosis not present

## 2021-05-18 DIAGNOSIS — J440 Chronic obstructive pulmonary disease with acute lower respiratory infection: Secondary | ICD-10-CM | POA: Diagnosis present

## 2021-05-18 DIAGNOSIS — Z79899 Other long term (current) drug therapy: Secondary | ICD-10-CM

## 2021-05-18 DIAGNOSIS — R64 Cachexia: Secondary | ICD-10-CM | POA: Diagnosis present

## 2021-05-18 DIAGNOSIS — R0602 Shortness of breath: Secondary | ICD-10-CM | POA: Diagnosis not present

## 2021-05-18 DIAGNOSIS — N186 End stage renal disease: Secondary | ICD-10-CM | POA: Diagnosis not present

## 2021-05-18 DIAGNOSIS — I083 Combined rheumatic disorders of mitral, aortic and tricuspid valves: Secondary | ICD-10-CM | POA: Diagnosis present

## 2021-05-18 DIAGNOSIS — E1151 Type 2 diabetes mellitus with diabetic peripheral angiopathy without gangrene: Secondary | ICD-10-CM | POA: Diagnosis present

## 2021-05-18 DIAGNOSIS — N189 Chronic kidney disease, unspecified: Secondary | ICD-10-CM | POA: Diagnosis present

## 2021-05-18 DIAGNOSIS — J918 Pleural effusion in other conditions classified elsewhere: Secondary | ICD-10-CM | POA: Diagnosis present

## 2021-05-18 DIAGNOSIS — J90 Pleural effusion, not elsewhere classified: Secondary | ICD-10-CM

## 2021-05-18 DIAGNOSIS — Z87891 Personal history of nicotine dependence: Secondary | ICD-10-CM

## 2021-05-18 DIAGNOSIS — I132 Hypertensive heart and chronic kidney disease with heart failure and with stage 5 chronic kidney disease, or end stage renal disease: Secondary | ICD-10-CM | POA: Diagnosis present

## 2021-05-18 DIAGNOSIS — F0390 Unspecified dementia without behavioral disturbance: Secondary | ICD-10-CM | POA: Diagnosis present

## 2021-05-18 DIAGNOSIS — Z89512 Acquired absence of left leg below knee: Secondary | ICD-10-CM

## 2021-05-18 DIAGNOSIS — L89150 Pressure ulcer of sacral region, unstageable: Secondary | ICD-10-CM | POA: Diagnosis present

## 2021-05-18 DIAGNOSIS — K5090 Crohn's disease, unspecified, without complications: Secondary | ICD-10-CM | POA: Diagnosis present

## 2021-05-18 DIAGNOSIS — Z8673 Personal history of transient ischemic attack (TIA), and cerebral infarction without residual deficits: Secondary | ICD-10-CM

## 2021-05-18 DIAGNOSIS — Z794 Long term (current) use of insulin: Secondary | ICD-10-CM

## 2021-05-18 DIAGNOSIS — Z833 Family history of diabetes mellitus: Secondary | ICD-10-CM

## 2021-05-18 DIAGNOSIS — I5043 Acute on chronic combined systolic (congestive) and diastolic (congestive) heart failure: Secondary | ICD-10-CM | POA: Diagnosis present

## 2021-05-18 DIAGNOSIS — D649 Anemia, unspecified: Secondary | ICD-10-CM | POA: Diagnosis not present

## 2021-05-18 DIAGNOSIS — Z681 Body mass index (BMI) 19 or less, adult: Secondary | ICD-10-CM

## 2021-05-18 DIAGNOSIS — E11649 Type 2 diabetes mellitus with hypoglycemia without coma: Secondary | ICD-10-CM

## 2021-05-18 DIAGNOSIS — E43 Unspecified severe protein-calorie malnutrition: Secondary | ICD-10-CM | POA: Diagnosis present

## 2021-05-18 DIAGNOSIS — Z7982 Long term (current) use of aspirin: Secondary | ICD-10-CM

## 2021-05-18 DIAGNOSIS — I959 Hypotension, unspecified: Secondary | ICD-10-CM | POA: Diagnosis not present

## 2021-05-18 DIAGNOSIS — Z8249 Family history of ischemic heart disease and other diseases of the circulatory system: Secondary | ICD-10-CM

## 2021-05-18 DIAGNOSIS — Z7960 Long term (current) use of unspecified immunomodulators and immunosuppressants: Secondary | ICD-10-CM

## 2021-05-18 DIAGNOSIS — I10 Essential (primary) hypertension: Secondary | ICD-10-CM | POA: Diagnosis present

## 2021-05-18 DIAGNOSIS — I255 Ischemic cardiomyopathy: Secondary | ICD-10-CM | POA: Diagnosis present

## 2021-05-18 DIAGNOSIS — E785 Hyperlipidemia, unspecified: Secondary | ICD-10-CM | POA: Diagnosis present

## 2021-05-18 DIAGNOSIS — Z8261 Family history of arthritis: Secondary | ICD-10-CM

## 2021-05-18 DIAGNOSIS — E1129 Type 2 diabetes mellitus with other diabetic kidney complication: Secondary | ICD-10-CM

## 2021-05-18 DIAGNOSIS — Z20822 Contact with and (suspected) exposure to covid-19: Secondary | ICD-10-CM | POA: Diagnosis present

## 2021-05-18 DIAGNOSIS — D84821 Immunodeficiency due to drugs: Secondary | ICD-10-CM | POA: Diagnosis present

## 2021-05-18 DIAGNOSIS — D631 Anemia in chronic kidney disease: Secondary | ICD-10-CM | POA: Diagnosis present

## 2021-05-18 LAB — CBC WITH DIFFERENTIAL/PLATELET
Abs Immature Granulocytes: 0.13 10*3/uL — ABNORMAL HIGH (ref 0.00–0.07)
Basophils Absolute: 0 10*3/uL (ref 0.0–0.1)
Basophils Relative: 0 %
Eosinophils Absolute: 0.1 10*3/uL (ref 0.0–0.5)
Eosinophils Relative: 2 %
HCT: 22.8 % — ABNORMAL LOW (ref 39.0–52.0)
Hemoglobin: 6.8 g/dL — ABNORMAL LOW (ref 13.0–17.0)
Immature Granulocytes: 2 %
Lymphocytes Relative: 13 %
Lymphs Abs: 0.8 10*3/uL (ref 0.7–4.0)
MCH: 25.3 pg — ABNORMAL LOW (ref 26.0–34.0)
MCHC: 29.8 g/dL — ABNORMAL LOW (ref 30.0–36.0)
MCV: 84.8 fL (ref 80.0–100.0)
Monocytes Absolute: 0.6 10*3/uL (ref 0.1–1.0)
Monocytes Relative: 9 %
Neutro Abs: 4.5 10*3/uL (ref 1.7–7.7)
Neutrophils Relative %: 74 %
Platelets: 97 10*3/uL — ABNORMAL LOW (ref 150–400)
RBC: 2.69 MIL/uL — ABNORMAL LOW (ref 4.22–5.81)
RDW: 20.5 % — ABNORMAL HIGH (ref 11.5–15.5)
Smear Review: NORMAL
WBC: 6.1 10*3/uL (ref 4.0–10.5)
nRBC: 0 % (ref 0.0–0.2)

## 2021-05-18 LAB — BASIC METABOLIC PANEL
Anion gap: 12 (ref 5–15)
BUN: 49 mg/dL — ABNORMAL HIGH (ref 6–20)
CO2: 27 mmol/L (ref 22–32)
Calcium: 8.7 mg/dL — ABNORMAL LOW (ref 8.9–10.3)
Chloride: 95 mmol/L — ABNORMAL LOW (ref 98–111)
Creatinine, Ser: 6.3 mg/dL — ABNORMAL HIGH (ref 0.61–1.24)
GFR, Estimated: 10 mL/min — ABNORMAL LOW (ref >60)
Glucose, Bld: 183 mg/dL — ABNORMAL HIGH (ref 70–99)
Potassium: 4.6 mmol/L (ref 3.5–5.1)
Sodium: 134 mmol/L — ABNORMAL LOW (ref 135–145)

## 2021-05-18 LAB — HEMOGLOBIN A1C
Hgb A1c MFr Bld: 6 % — ABNORMAL HIGH (ref 4.8–5.6)
Mean Plasma Glucose: 125.5 mg/dL

## 2021-05-18 LAB — CBC
HCT: 25.9 % — ABNORMAL LOW (ref 39.0–52.0)
Hemoglobin: 7.8 g/dL — ABNORMAL LOW (ref 13.0–17.0)
MCH: 25.2 pg — ABNORMAL LOW (ref 26.0–34.0)
MCHC: 30.1 g/dL (ref 30.0–36.0)
MCV: 83.5 fL (ref 80.0–100.0)
Platelets: 123 10*3/uL — ABNORMAL LOW (ref 150–400)
RBC: 3.1 MIL/uL — ABNORMAL LOW (ref 4.22–5.81)
RDW: 20.5 % — ABNORMAL HIGH (ref 11.5–15.5)
WBC: 6.9 10*3/uL (ref 4.0–10.5)
nRBC: 0 % (ref 0.0–0.2)

## 2021-05-18 LAB — BODY FLUID CELL COUNT WITH DIFFERENTIAL
Eos, Fluid: 0 %
Lymphs, Fluid: 69 %
Monocyte-Macrophage-Serous Fluid: 25 %
Neutrophil Count, Fluid: 6 %
Total Nucleated Cell Count, Fluid: 139 cu mm

## 2021-05-18 LAB — TROPONIN I (HIGH SENSITIVITY)
Troponin I (High Sensitivity): 34 ng/L — ABNORMAL HIGH (ref <18)
Troponin I (High Sensitivity): 30 ng/L — ABNORMAL HIGH (ref <18)

## 2021-05-18 LAB — CULTURE, BLOOD (ROUTINE X 2): Culture: NO GROWTH

## 2021-05-18 LAB — ALBUMIN, PLEURAL OR PERITONEAL FLUID: Albumin, Fluid: 1 g/dL

## 2021-05-18 LAB — LACTATE DEHYDROGENASE, PLEURAL OR PERITONEAL FLUID: LD, Fluid: 70 U/L — ABNORMAL HIGH (ref 3–23)

## 2021-05-18 LAB — GLUCOSE, PLEURAL OR PERITONEAL FLUID: Glucose, Fluid: 193 mg/dL

## 2021-05-18 LAB — GLUCOSE, CAPILLARY
Glucose-Capillary: 133 mg/dL — ABNORMAL HIGH (ref 70–99)
Glucose-Capillary: 164 mg/dL — ABNORMAL HIGH (ref 70–99)
Glucose-Capillary: 181 mg/dL — ABNORMAL HIGH (ref 70–99)
Glucose-Capillary: 184 mg/dL — ABNORMAL HIGH (ref 70–99)
Glucose-Capillary: 189 mg/dL — ABNORMAL HIGH (ref 70–99)

## 2021-05-18 LAB — RESP PANEL BY RT-PCR (FLU A&B, COVID) ARPGX2
Influenza A by PCR: NEGATIVE
Influenza B by PCR: NEGATIVE
SARS Coronavirus 2 by RT PCR: NEGATIVE

## 2021-05-18 LAB — PROCALCITONIN: Procalcitonin: 1.09 ng/mL

## 2021-05-18 LAB — PROTEIN, PLEURAL OR PERITONEAL FLUID: Total protein, fluid: <3 g/dL

## 2021-05-18 LAB — CREATININE, SERUM
Creatinine, Ser: 6.48 mg/dL — ABNORMAL HIGH (ref 0.61–1.24)
GFR, Estimated: 9 mL/min — ABNORMAL LOW (ref >60)

## 2021-05-18 LAB — BRAIN NATRIURETIC PEPTIDE: B Natriuretic Peptide: >4500 pg/mL — ABNORMAL HIGH (ref 0.0–100.0)

## 2021-05-18 IMAGING — CT CT ABD-PELV W/O CM
2 of 4 series · 15 of 46 positions shown, 17 images · non-contrast
Comparison: 11/18/2020

CLINICAL DATA: Abdominal pain and fever since dialysis.

EXAM:
CT ABDOMEN AND PELVIS WITHOUT CONTRAST
TECHNIQUE: Multidetector CT imaging of the abdomen and pelvis was performed
following the standard protocol without IV contrast.

[Series 2: routine abd/pel wo · axial · 0.84mm/px · z∈[-918,-453]mm · 12 of 107 slices shown, 14 images]
[im 9/107  soft-tissue]
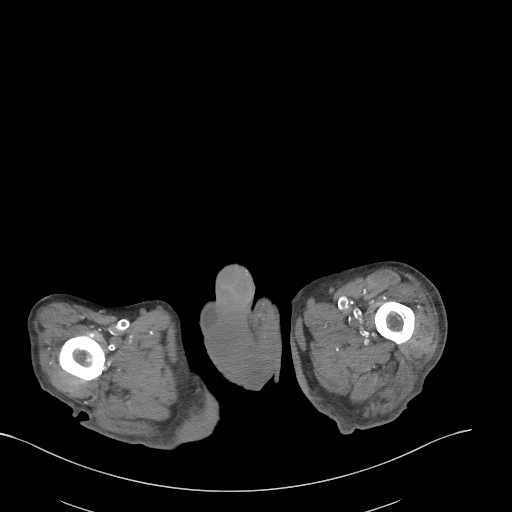
[im 9/107  bone]
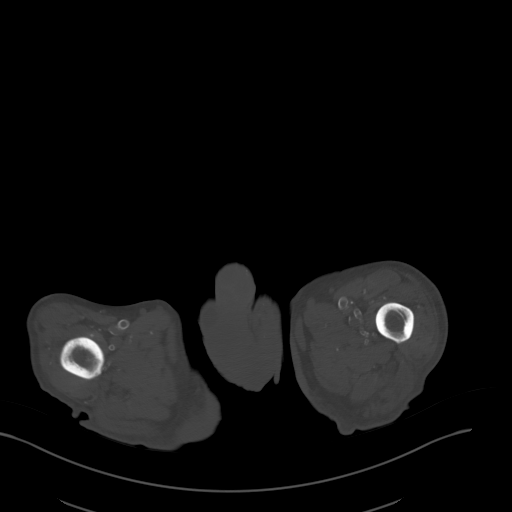
[im 17/107  soft-tissue]
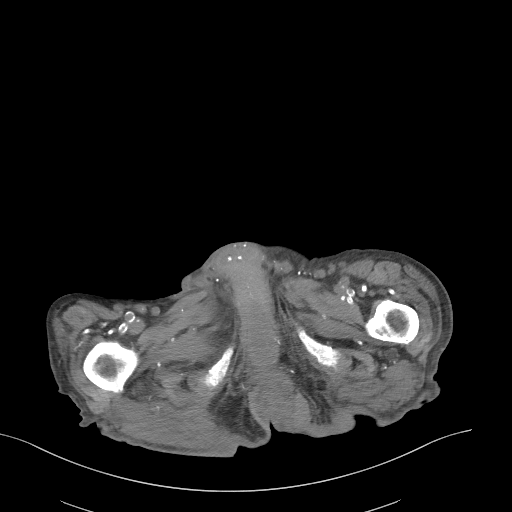
[im 26/107  soft-tissue]
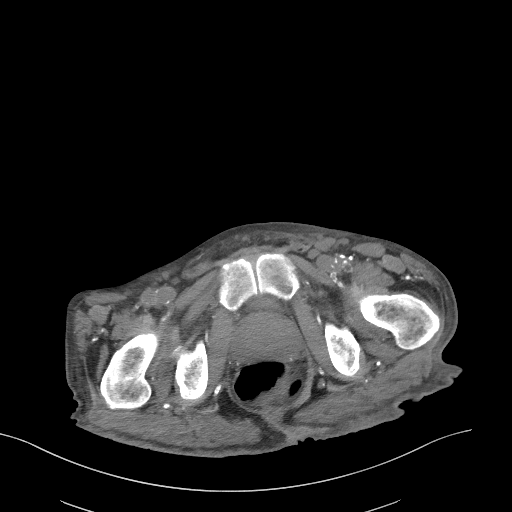
[im 34/107  soft-tissue]
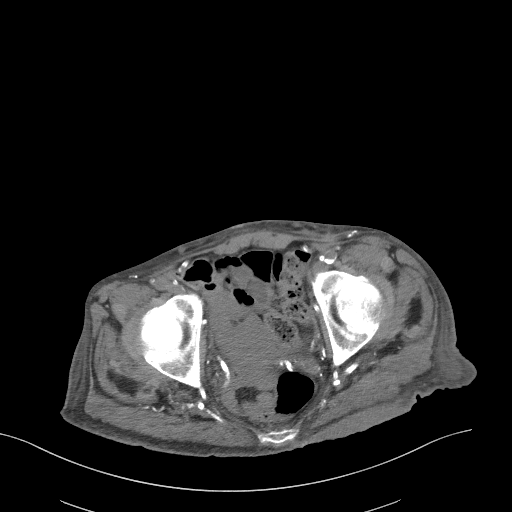
[im 43/107  soft-tissue]
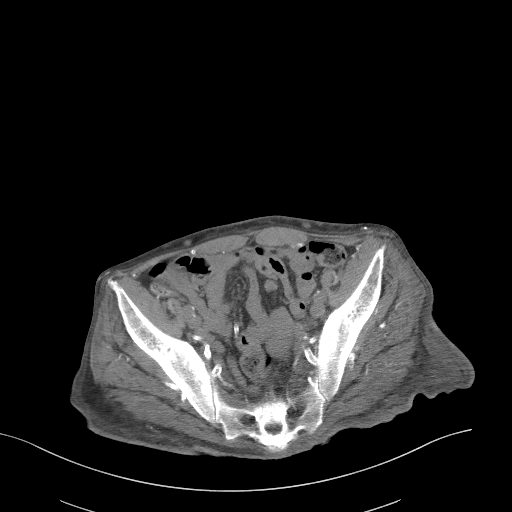
[im 51/107  soft-tissue]
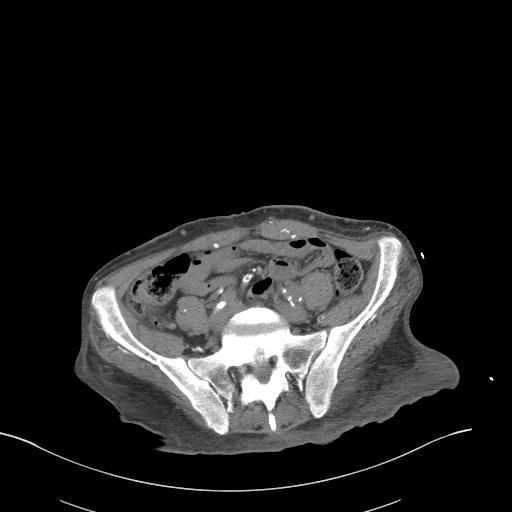
[im 60/107  soft-tissue]
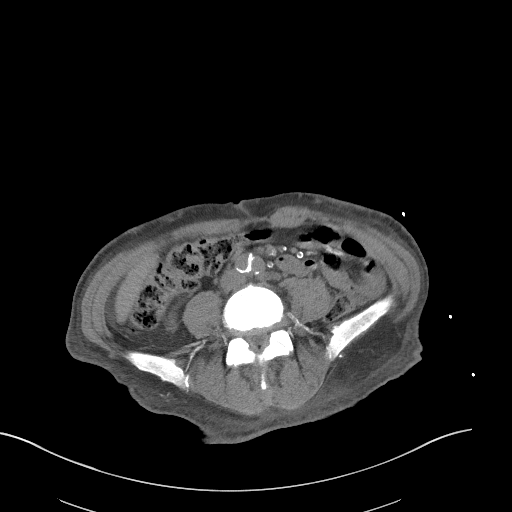
[im 68/107  soft-tissue]
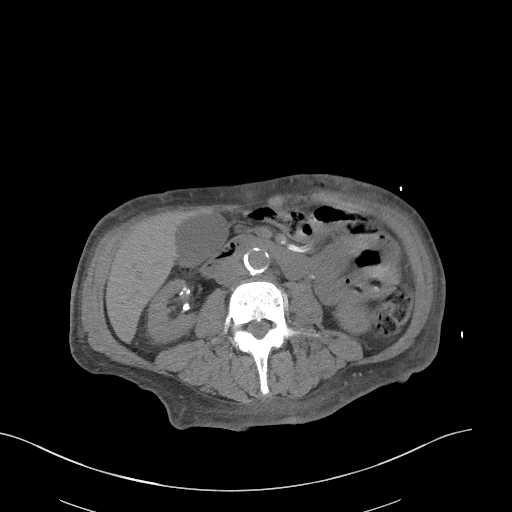
[im 77/107  soft-tissue]
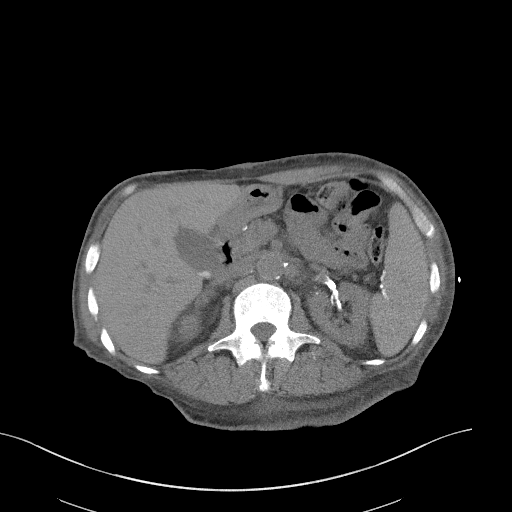
[im 77/107  bone]
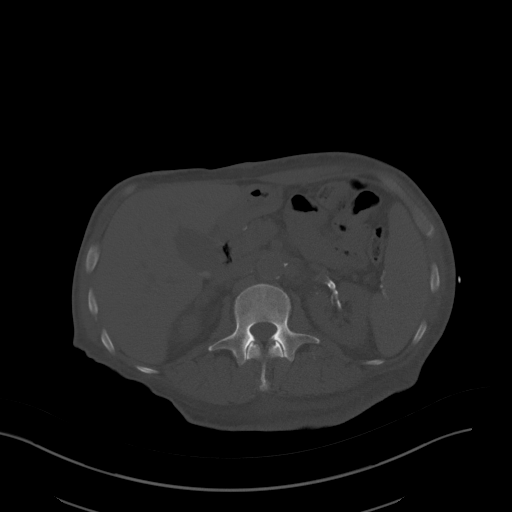
[im 85/107  soft-tissue]
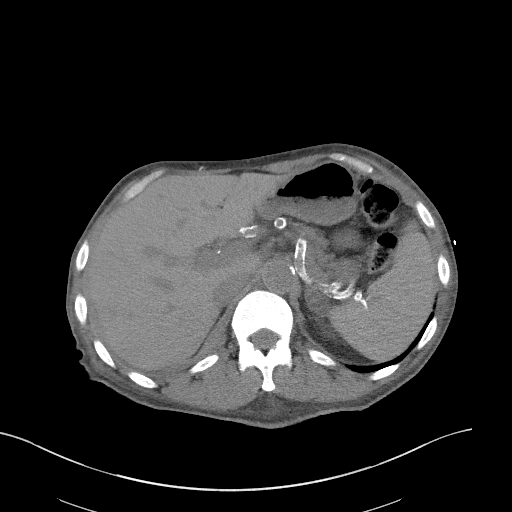
[im 94/107  soft-tissue]
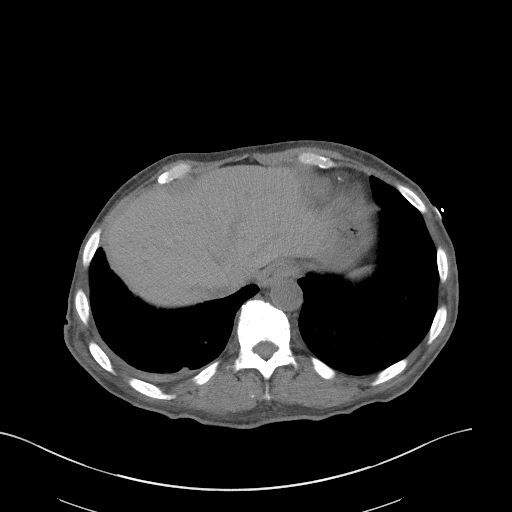
[im 102/107  soft-tissue]
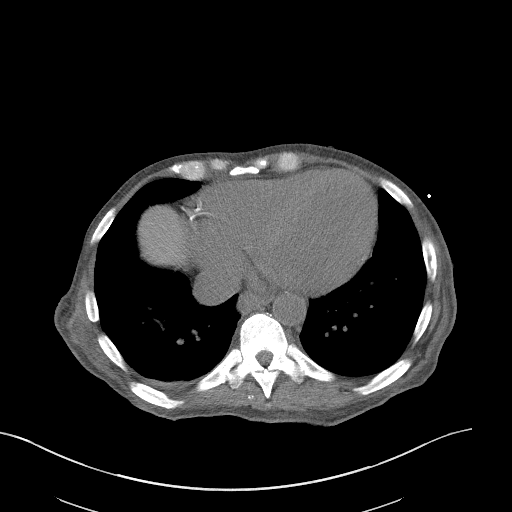

[Series 5: coronal st · coronal · 0.75mm/px · 3 of 91 slices shown]
[im 31/91  soft-tissue]
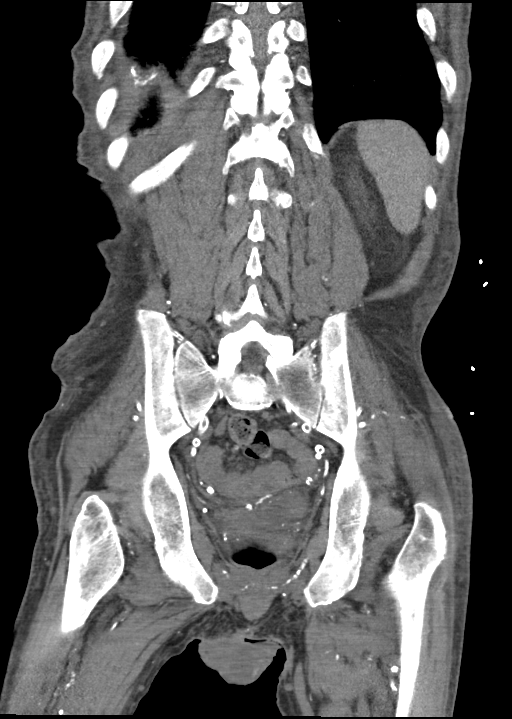
[im 41/91  soft-tissue]
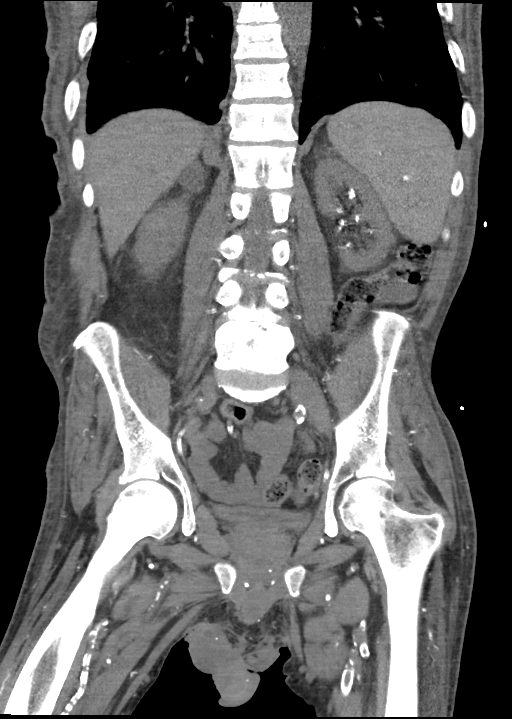
[im 51/91  soft-tissue]
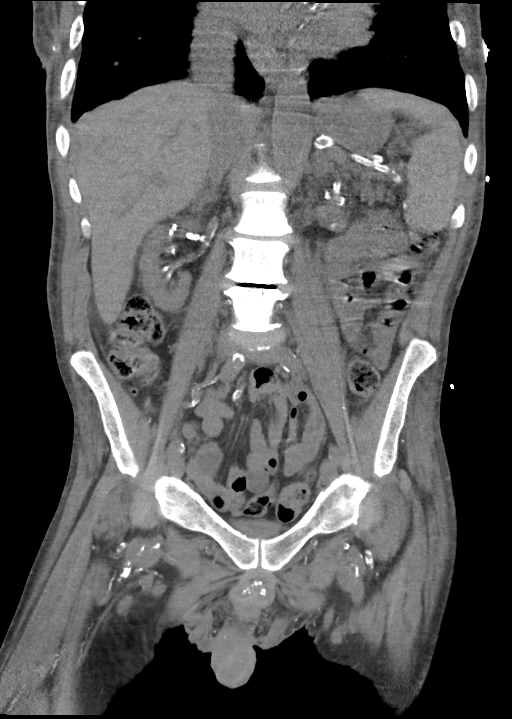

[15 of 46 positions shown; findings below may reference images not displayed]

FINDINGS: Lower chest: Motion artifact limits the examination. Small right
pleural effusion with probable right pleural thickening and
calcification. No change since prior study. Mild interstitial
changes in the lung bases may indicate edema. Cardiac enlargement.

Hepatobiliary: No focal liver lesions identified on noncontrast
imaging. Small stones in the gallbladder. No gallbladder wall
thickening or edema. Bile ducts are not dilated.

Pancreas: Unremarkable. No pancreatic ductal dilatation or
surrounding inflammatory changes.

Spleen: Spleen is mildly enlarged.  No focal lesions identified.

Adrenals/Urinary Tract: No adrenal gland nodules. Kidneys are
symmetrical. No hydronephrosis or stone. Bladder is decompressed.

Stomach/Bowel: Stomach, small bowel, and colon are mostly
decompressed. Scattered stool in the colon. No inflammatory changes
appreciated. Appendix is segmentally identified and appears normal.

Vascular/Lymphatic: Extensive vascular calcification throughout the
abdomen and pelvis. Aortic calcification. No aortic aneurysm.

Reproductive: Prostate gland is diffusely enlarged.

Other: No free air or free fluid in the abdomen. Subcutaneous soft
tissue gas in the upper abdomen likely represents injection sites.

Musculoskeletal: Diffuse bone sclerosis likely representing renal
osteodystrophy. Degenerative changes in the lumbar spine.
IMPRESSION: 1. No acute process demonstrated in the abdomen or pelvis. No
evidence of bowel obstruction or inflammation.
2. Cholelithiasis without evidence of cholecystitis.
3. Extensive vascular calcification throughout the abdomen and
pelvis.
4. Enlarged prostate gland.
5. Small right pleural effusion with probable right pleural
thickening and calcification. No change since prior study.
6. Diffuse bone sclerosis likely representing renal osteodystrophy.

Aortic Atherosclerosis ([id number]).

## 2021-05-18 MED ORDER — SPIRONOLACTONE 25 MG PO TABS
25.0000 mg | ORAL_TABLET | Freq: Every day | ORAL | Status: DC
  Filled 2021-05-18 (×2): qty 1

## 2021-05-18 MED ORDER — ATORVASTATIN CALCIUM 20 MG PO TABS
80.0000 mg | ORAL_TABLET | Freq: Every day | ORAL | Status: DC
  Administered 2021-05-19 – 2021-05-26 (×8): 80 mg via ORAL
  Filled 2021-05-18 (×9): qty 4

## 2021-05-18 MED ORDER — ASPIRIN 81 MG PO CHEW
81.0000 mg | CHEWABLE_TABLET | Freq: Every day | ORAL | Status: DC
  Administered 2021-05-19 – 2021-05-26 (×8): 81 mg via ORAL
  Filled 2021-05-18 (×9): qty 1

## 2021-05-18 MED ORDER — BUDESONIDE 0.25 MG/2ML IN SUSP
0.2500 mg | Freq: Two times a day (BID) | RESPIRATORY_TRACT | Status: DC
  Administered 2021-05-18 – 2021-05-26 (×14): 0.25 mg via RESPIRATORY_TRACT
  Filled 2021-05-18 (×15): qty 2

## 2021-05-18 MED ORDER — SODIUM CHLORIDE 0.9 % IV SOLN
500.0000 mg | INTRAVENOUS | Status: DC
  Administered 2021-05-20: 500 mg via INTRAVENOUS
  Filled 2021-05-18 (×2): qty 500

## 2021-05-18 MED ORDER — SODIUM CHLORIDE 0.9 % IV SOLN
1.0000 g | INTRAVENOUS | Status: AC
Start: 2021-05-20 — End: 2021-05-22
  Administered 2021-05-20 – 2021-05-21 (×2): 1 g via INTRAVENOUS
  Filled 2021-05-18: qty 1
  Filled 2021-05-18: qty 10
  Filled 2021-05-18: qty 1

## 2021-05-18 MED ORDER — INSULIN ASPART 100 UNIT/ML IJ SOLN
0.0000 [IU] | Freq: Three times a day (TID) | INTRAMUSCULAR | Status: DC
  Administered 2021-05-18 (×2): 1 [IU] via SUBCUTANEOUS
  Administered 2021-05-19 (×2): 4 [IU] via SUBCUTANEOUS
  Administered 2021-05-19: 2 [IU] via SUBCUTANEOUS
  Administered 2021-05-20: 3 [IU] via SUBCUTANEOUS
  Filled 2021-05-18 (×6): qty 1

## 2021-05-18 MED ORDER — FUROSEMIDE 10 MG/ML IJ SOLN
40.0000 mg | Freq: Once | INTRAMUSCULAR | Status: AC
  Administered 2021-05-18: 40 mg via INTRAVENOUS
  Filled 2021-05-18: qty 4

## 2021-05-18 MED ORDER — ENSURE ENLIVE PO LIQD: 237.0000 mL | Freq: Three times a day (TID) | ORAL | Status: DC

## 2021-05-18 MED ORDER — CARVEDILOL 25 MG PO TABS
25.0000 mg | ORAL_TABLET | Freq: Two times a day (BID) | ORAL | Status: DC
  Administered 2021-05-18 – 2021-05-26 (×14): 25 mg via ORAL
  Filled 2021-05-18 (×15): qty 1

## 2021-05-18 MED ORDER — LEVOFLOXACIN IN D5W 750 MG/150ML IV SOLN
750.0000 mg | Freq: Once | INTRAVENOUS | Status: AC
  Administered 2021-05-18: 750 mg via INTRAVENOUS
  Filled 2021-05-18: qty 150

## 2021-05-18 MED ORDER — CHLORHEXIDINE GLUCONATE 0.12 % MT SOLN
15.0000 mL | Freq: Two times a day (BID) | OROMUCOSAL | Status: DC
  Administered 2021-05-18 – 2021-05-21 (×6): 15 mL via OROMUCOSAL
  Filled 2021-05-18 (×6): qty 15

## 2021-05-18 MED ORDER — CHLORHEXIDINE GLUCONATE CLOTH 2 % EX PADS
6.0000 | MEDICATED_PAD | Freq: Every day | CUTANEOUS | Status: DC
  Administered 2021-05-18 – 2021-05-26 (×7): 6 via TOPICAL

## 2021-05-18 MED ORDER — RENA-VITE PO TABS
1.0000 | ORAL_TABLET | Freq: Every day | ORAL | Status: DC
  Administered 2021-05-18 – 2021-05-25 (×8): 1 via ORAL
  Filled 2021-05-18 (×10): qty 1

## 2021-05-18 MED ORDER — AMLODIPINE BESYLATE 10 MG PO TABS
10.0000 mg | ORAL_TABLET | Freq: Every day | ORAL | Status: DC
  Administered 2021-05-19: 10 mg via ORAL
  Filled 2021-05-18 (×2): qty 1

## 2021-05-18 MED ORDER — ACETAMINOPHEN 650 MG RE SUPP: 650.0000 mg | Freq: Four times a day (QID) | RECTAL | Status: DC | PRN

## 2021-05-18 MED ORDER — ARFORMOTEROL TARTRATE 15 MCG/2ML IN NEBU
15.0000 ug | INHALATION_SOLUTION | Freq: Two times a day (BID) | RESPIRATORY_TRACT | Status: DC
  Administered 2021-05-18 – 2021-05-26 (×13): 15 ug via RESPIRATORY_TRACT
  Filled 2021-05-18 (×19): qty 2

## 2021-05-18 MED ORDER — LISINOPRIL 20 MG PO TABS
40.0000 mg | ORAL_TABLET | Freq: Every day | ORAL | Status: DC
  Filled 2021-05-18: qty 2

## 2021-05-18 MED ORDER — HEPARIN SODIUM (PORCINE) 5000 UNIT/ML IJ SOLN
5000.0000 [IU] | Freq: Three times a day (TID) | INTRAMUSCULAR | Status: DC
  Administered 2021-05-18 – 2021-05-26 (×24): 5000 [IU] via SUBCUTANEOUS
  Filled 2021-05-18 (×24): qty 1

## 2021-05-18 MED ORDER — LEVOFLOXACIN IN D5W 500 MG/100ML IV SOLN: 500.0000 mg | INTRAVENOUS | Status: DC

## 2021-05-18 MED ORDER — NEPRO/CARBSTEADY PO LIQD
237.0000 mL | Freq: Three times a day (TID) | ORAL | Status: DC
  Administered 2021-05-19 – 2021-05-26 (×17): 237 mL via ORAL

## 2021-05-18 MED ORDER — FUROSEMIDE 20 MG PO TABS
80.0000 mg | ORAL_TABLET | Freq: Two times a day (BID) | ORAL | Status: DC
  Administered 2021-05-19: 80 mg via ORAL
  Filled 2021-05-18: qty 2
  Filled 2021-05-18: qty 4

## 2021-05-18 MED ORDER — ACETAMINOPHEN 325 MG PO TABS
650.0000 mg | ORAL_TABLET | Freq: Four times a day (QID) | ORAL | Status: DC | PRN
  Administered 2021-05-19 – 2021-05-26 (×8): 650 mg via ORAL
  Filled 2021-05-18 (×8): qty 2

## 2021-05-18 MED ORDER — ORAL CARE MOUTH RINSE
15.0000 mL | Freq: Two times a day (BID) | OROMUCOSAL | Status: DC
  Administered 2021-05-19 – 2021-05-20 (×4): 15 mL via OROMUCOSAL

## 2021-05-18 MED ORDER — IPRATROPIUM-ALBUTEROL 0.5-2.5 (3) MG/3ML IN SOLN
3.0000 mL | RESPIRATORY_TRACT | Status: DC
  Administered 2021-05-18 – 2021-05-20 (×10): 3 mL via RESPIRATORY_TRACT
  Filled 2021-05-18 (×10): qty 3

## 2021-05-18 MED ORDER — CLONIDINE HCL 0.1 MG PO TABS
0.2000 mg | ORAL_TABLET | Freq: Two times a day (BID) | ORAL | Status: DC
  Administered 2021-05-18 – 2021-05-19 (×3): 0.2 mg via ORAL
  Filled 2021-05-18 (×4): qty 2

## 2021-05-18 MED ORDER — SEVELAMER CARBONATE 800 MG PO TABS
800.0000 mg | ORAL_TABLET | Freq: Three times a day (TID) | ORAL | Status: DC
  Administered 2021-05-19: 800 mg via ORAL
  Filled 2021-05-18 (×3): qty 1

## 2021-05-18 MED ORDER — HYDRALAZINE HCL 50 MG PO TABS
100.0000 mg | ORAL_TABLET | Freq: Three times a day (TID) | ORAL | Status: DC
  Administered 2021-05-18 – 2021-05-19 (×4): 100 mg via ORAL
  Filled 2021-05-18 (×4): qty 2

## 2021-05-18 MED ORDER — LEVOFLOXACIN IN D5W 750 MG/150ML IV SOLN: 750.0000 mg | Freq: Once | INTRAVENOUS | Status: DC

## 2021-05-18 MED ORDER — GABAPENTIN 300 MG PO CAPS
400.0000 mg | ORAL_CAPSULE | Freq: Every day | ORAL | Status: DC
  Administered 2021-05-18 – 2021-05-25 (×8): 400 mg via ORAL
  Filled 2021-05-18 (×8): qty 1

## 2021-05-18 MED ORDER — LEVETIRACETAM 500 MG PO TABS
500.0000 mg | ORAL_TABLET | Freq: Every day | ORAL | Status: DC
  Administered 2021-05-18 – 2021-05-25 (×8): 500 mg via ORAL
  Filled 2021-05-18 (×12): qty 1

## 2021-05-18 MED ORDER — CHLORHEXIDINE GLUCONATE CLOTH 2 % EX PADS
6.0000 | MEDICATED_PAD | Freq: Every day | CUTANEOUS | Status: DC
  Administered 2021-05-18 – 2021-05-26 (×2): 6 via TOPICAL

## 2021-05-18 MED ORDER — METHYLPREDNISOLONE SODIUM SUCC 125 MG IJ SOLR
125.0000 mg | Freq: Once | INTRAMUSCULAR | Status: AC
  Administered 2021-05-18: 125 mg via INTRAVENOUS
  Filled 2021-05-18: qty 2

## 2021-05-18 NOTE — Progress Notes (Signed)
Notified by telemetry that a rapid response was called for [name] in dialysis, charge nurse was notified, and this RN went to HD department to assist. Patient was not in HD, notified at that time by HD RN that patient had been taken to ICU for hypoxia and to initiate bipap. Arrived in ICU, gave report to ICU, RN. MD was at the bedside and plans to update family member. Charge nurse and telemetry made aware of patient's transfer to ICU room 6.

## 2021-05-18 NOTE — ED Provider Notes (Addendum)
[facility] Emergency Department Provider Note  ____________________________________________  Time seen: Approximately 2:06 AM  I have reviewed the triage vital signs and the nursing notes.   HISTORY  Chief Complaint Respiratory Distress   HPI Elwood Bazinet Neitzke is a 60 y.o. male with a history of ESRD on HD, diabetes, empyema, intracerebral hemorrhage, hypertension, stroke, dementia who presents from home for shortness of breath.  History is gathered mostly from patient's wife was at bedside.  Patient was recently admitted to the hospital for 2 days and discharged 3 days ago.  According to the wife he has been having persistent symptoms since being discharged home.  He was admitted for respiratory distress in the setting of questionable pneumonia and fluid overload.  Has not missed any dialysis with the last treatment being on Friday.  He has had shortness of breath throughout most of the day today but this evening while laying flat the shortness of breath became severe.  When EMS arrived patient was satting in the 80s and transported on a nonrebreather at 15 L with improvement of his symptoms.  He has had a mild cough but no fever at home.  Patient denies any chest pain.  No vomiting or diarrhea  Past Medical History:  Diagnosis Date   Acute metabolic encephalopathy 0/01/8888   Anemia    Crohn disease ([place])    Diabetes mellitus without complication ([place])    DVT of lower extremity (deep venous thrombosis) ([place]) 2016   Empyema ([place]) 05/20/2017   Encephalopathy 12/04/2017   Fall at home, initial encounter 09/12/2020   Hidradenitis suppurativa    Hypertension    ICH (intracerebral hemorrhage) ([place])    Peritonitis ([place]) 04/21/2017   Pyogenic arthritis of knee ([place]) 02/04/2016   Renal disorder    Sepsis ([place]) 01/12/2018   Stroke [facility])     Patient Active Problem List   Diagnosis Date Noted   Acute respiratory failure with hypoxia ([place]) 05/18/2021   Protein-calorie  malnutrition, severe 05/14/2021   Acute pulmonary edema (HCC)    Shortness of breath    Fluid overload 05/12/2021   Cellulitis and abscess of buttock    History of GI bleed 02/06/2021   Long term current use of immunosuppressive drug 02/06/2021   Disorder of skin due to Crohn's disease ([place]) 16/94/5038   Complication of vascular access for dialysis 01/22/2021   Polyp of transverse colon    Acute metabolic encephalopathy 88/28/0034   Anemia in ESRD (end-stage renal disease) ([place]) 91/79/1505   Chronic systolic CHF (congestive heart failure) ([place]) 11/18/2020   Cerebrovascular disease 09/12/2020   Transient alteration of awareness 09/12/2020   Hyperkalemia 09/12/2020   Mild cognitive impairment 09/30/2019   Peripheral vascular disease ([employer]) 06/28/2018   Sepsis ([place]) 01/12/2018   Pressure injury of skin 12/04/2017   S/P bilateral BKA (below knee amputation) ([place]) 10/20/2017   Atherosclerosis of native arteries of extremity with rest pain ([place]) 08/05/2017   History of CVA (cerebrovascular accident) 04/15/2017   Seizure disorder ([place]) 04/15/2017   End stage renal failure on dialysis ([place]) 04/12/2016   Aphthae 02/20/2016   Hidradenitis suppurativa 02/20/2016   Leg pain 02/20/2016   Neuropathy 02/20/2016   Narrowing of intervertebral disc space 08/29/2015   Vascular disorder of lower extremity 08/29/2015   Failure of erection 08/29/2015   Hypercholesteremia 08/29/2015   Hypertension 08/29/2015   Anemia due to chronic kidney disease 05/26/2015   Venous insufficiency of leg 09/04/2014   History of deep vein thrombosis (DVT) of  lower extremity 08/16/2014   Prostatic intraepithelial neoplasia 11/02/2013   Elevated prostate specific antigen (PSA) 09/11/2013   Benign prostatic hyperplasia with urinary obstruction 08/13/2013   Spermatocele 08/13/2013   Avitaminosis D 01/25/2013   Type 2 diabetes mellitus with kidney complication, with long-term current use of insulin ([place]) 03/28/2012    Crohn disease ([place]) 08/03/2011    Past Surgical History:  Procedure Laterality Date   A/V FISTULAGRAM Left 02/24/2021   Procedure: A/V FISTULAGRAM;  Surgeon: Katha Cabal, MD;  Location: [place] CV LAB;  Service: Cardiovascular;  Laterality: Left;   ABDOMINAL SURGERY     AMPUTATION FINGER Left 06/2019   PR AMPUTATION LONG FINGER/THUMB+FLAPS [facility]   ANGIOPLASTY Left    [facility] 04-11-2018   BELOW KNEE LEG AMPUTATION Right 08/2017   [facility]   COLONOSCOPY     COLONOSCOPY WITH PROPOFOL N/A 10/28/2020   Procedure: COLONOSCOPY WITH PROPOFOL;  Surgeon: Lin Landsman, MD;  Location: [place];  Service: Gastroenterology;  Laterality: N/A;   COLONOSCOPY WITH PROPOFOL N/A 11/21/2020   Procedure: COLONOSCOPY WITH PROPOFOL;  Surgeon: [name], MD;  Location: [facility] ENDOSCOPY;  Service: Endoscopy;  Laterality: N/A;   DIALYSIS/PERMA CATHETER INSERTION N/A 12/09/2017   Procedure: DIALYSIS/PERMA CATHETER INSERTION;  Surgeon: Katha Cabal, MD;  Location: [place] CV LAB;  Service: Cardiovascular;  Laterality: N/A;   DIALYSIS/PERMA CATHETER INSERTION N/A 12/12/2017   Procedure: DIALYSIS/PERMA CATHETER INSERTION;  Surgeon: Algernon Huxley, MD;  Location: [place] CV LAB;  Service: Cardiovascular;  Laterality: N/A;   DIALYSIS/PERMA CATHETER REMOVAL Left 12/09/2017   Procedure: DIALYSIS/PERMA CATHETER REMOVAL;  Surgeon: Katha Cabal, MD;  Location: [place] CV LAB;  Service: Cardiovascular;  Laterality: Left;   ESOPHAGOGASTRODUODENOSCOPY (EGD) WITH PROPOFOL N/A 11/20/2020   Procedure: ESOPHAGOGASTRODUODENOSCOPY (EGD) WITH PROPOFOL;  Surgeon: [name], MD;  Location: [facility] ENDOSCOPY;  Service: Endoscopy;  Laterality: N/A;   KNEE SURGERY Left 02/04/2016   [facility]   LEG AMPUTATION THROUGH LOWER TIBIA AND FIBULA Left 06/22/2018   [facility]   LOWER EXTREMITY ANGIOGRAPHY Right 08/08/2017   Procedure: Lower Extremity Angiography;  Surgeon: Algernon Huxley, MD;  Location: [place] CV LAB;  Service: Cardiovascular;  Laterality: Right;   LOWER EXTREMITY ANGIOGRAPHY Right 08/22/2017   Procedure: Lower Extremity Angiography;  Surgeon: Algernon Huxley, MD;  Location: [place] CV LAB;  Service: Cardiovascular;  Laterality: Right;   LOWER EXTREMITY INTERVENTION  08/08/2017   Procedure: LOWER EXTREMITY INTERVENTION;  Surgeon: Algernon Huxley, MD;  Location: [place] CV LAB;  Service: Cardiovascular;;   LOWER EXTREMITY INTERVENTION  08/22/2017   Procedure: LOWER EXTREMITY INTERVENTION;  Surgeon: Algernon Huxley, MD;  Location: [place] CV LAB;  Service: Cardiovascular;;    Prior to Admission medications   Medication Sig Start Date End Date Taking? Authorizing Provider  acetaminophen (TYLENOL) 500 MG tablet Take 1,000 mg by mouth daily as needed for moderate pain or headache.    Yes [provider]  Alcohol Swabs PADS Use as directed to check blood sugar three times daily for insulin dependent type 2 diabetes. 10/20/17  Yes [name], MD  amLODipine (NORVASC) 10 MG tablet TAKE 1 TABLET(10 MG) BY MOUTH DAILY AS NEEDED Patient taking differently: Take 10 mg by mouth daily. 03/02/21  Yes [name], MD  aspirin 81 MG chewable tablet Chew 81 mg by mouth daily.   Yes [provider]  atorvastatin (LIPITOR) 80 MG tablet TAKE 1 TABLET(80 MG) BY MOUTH DAILY  06/25/20  Yes [name], MD  AURYXIA 1 GM 210 MG(Fe) tablet Take 420 mg by mouth in the morning and at bedtime.  10/23/18  Yes [provider]  Blood Glucose Monitoring Suppl (ONE TOUCH ULTRA 2) w/Device KIT Use as directed to check blood sugar three times daily. E11.9 02/20/18  Yes [name], MD  carvedilol (COREG) 25 MG tablet Take 1 tablet (25 mg total) by mouth 2 (two) times daily. 06/27/20  Yes [name], MD  cloNIDine (CATAPRES) 0.2 MG tablet Take 1 tablet (0.2 mg total) by mouth 2 (two) times daily. 05/15/21  Yes Lorella Nimrod, MD  feeding supplement (ENSURE ENLIVE /  ENSURE PLUS) LIQD Take 237 mLs by mouth 3 (three) times daily between meals. 05/15/21  Yes Lorella Nimrod, MD  furosemide (LASIX) 80 MG tablet Take 1 tablet (80 mg total) by mouth 2 (two) times daily. 03/10/21  Yes [name], MD  gabapentin (NEURONTIN) 100 MG capsule Take 100 mg by mouth at bedtime. Take along with 332m capsule at bedtime   Yes [provider]  hydrALAZINE (APRESOLINE) 100 MG tablet Take 1 tablet (100 mg total) by mouth every 8 (eight) hours. 05/15/21  Yes ALorella Nimrod MD  levETIRAcetam (KEPPRA) 500 MG tablet Take 1 tablet (500 mg total) by mouth daily. 05/16/21  Yes ALorella Nimrod MD  lisinopril (ZESTRIL) 40 MG tablet Take 40 mg by mouth daily. 12/17/20  Yes [provider]  multivitamin (RENA-VIT) TABS tablet Take 1 tablet by mouth at bedtime. 05/15/21  Yes ALorella Nimrod MD  sevelamer carbonate (RENVELA) 800 MG tablet TAKE 1 TABLET(800 MG) BY MOUTH THREE TIMES DAILY 09/22/20  Yes FBirdie Sons MD  spironolactone (ALDACTONE) 25 MG tablet Take 1 tablet by mouth at bedtime. 12/17/20 12/17/21 Yes [provider]  Adalimumab ([name] PEN) 40 MG/0.4ML PNKT Inject 40 mg into the muscle once a week.  11/27/18   [provider]    Allergies Methotrexate, Vancomycin, Cefepime, and Tape  Family History  Problem Relation Age of Onset   Irritable bowel syndrome Sister    Diabetes Sister    Heart disease Mother    Diabetes Mother    Heart disease Father    Rheumatic fever Father        as child   Psoriasis Brother    Arthritis Brother    Diabetes Sister    Diabetes Sister     Social History Social History   Tobacco Use   Smoking status: Former   Smokeless tobacco: Never   Tobacco comments:    smokes marijuana  Vaping Use   Vaping Use: Never used  Substance Use Topics   Alcohol use: No   Drug use: Yes    Types: Marijuana    Review of Systems  Constitutional: Negative for fever. Eyes: Negative for visual changes. ENT: Negative  for sore throat. Neck: No neck pain  Cardiovascular: Negative for chest pain. Respiratory: + shortness of breath. Gastrointestinal: Negative for abdominal pain, vomiting or diarrhea. Genitourinary: Negative for dysuria. Musculoskeletal: Negative for back pain. Skin: Negative for rash. Neurological: Negative for headaches, weakness or numbness. Psych: No SI or HI  ____________________________________________   PHYSICAL EXAM:  VITAL SIGNS: ED Triage Vitals  Enc Vitals Group     BP --      Pulse Rate 05/18/21 0134 67     Resp --      Temp --      Temp src --  SpO2 05/18/21 0133 96 %     Weight 05/18/21 0135 125 lb (56.7 kg)     Height 05/18/21 0135 5' 6"  (1.676 m)     Head Circumference --      Peak Flow --      Pain Score 05/18/21 0134 0     Pain Loc --      Pain Edu? --      Excl. in [place]? --     Constitutional: Alert and oriented x 2, chronically ill appearing in mild respiratory distress.  HEENT:      Head: Normocephalic and atraumatic.         Eyes: Conjunctivae are normal. Sclera is non-icteric.       Mouth/Throat: Mucous membranes are moist.       Neck: Supple with no signs of meningismus. Cardiovascular: Regular rate and rhythm. No murmurs, gallops, or rubs. Respiratory: Slightly increased work of breathing with severely diminished breath sounds bilaterally, no wheezing.  Patient was weaned off of a nonrebreather at 15 L to nasal cannula 4 L satting in the mid 90s Gastrointestinal: Soft, non tender, and non distended. Musculoskeletal:  No edema, cyanosis, or erythema of extremities. B/l BKAs Neurologic: Normal speech and language. Face is symmetric. Moving all extremities. No gross focal neurologic deficits are appreciated. Skin: Skin is warm, dry and intact. No rash noted. Psychiatric: Mood and affect are normal. Speech and behavior are normal.  ____________________________________________   LABS (all labs ordered are listed, but only abnormal results are  displayed)  Labs Reviewed  CBC WITH DIFFERENTIAL/PLATELET - Abnormal; Notable for the following components:      Result Value   RBC 2.69 (*)    Hemoglobin 6.8 (*)    HCT 22.8 (*)    MCH 25.3 (*)    MCHC 29.8 (*)    RDW 20.5 (*)    Platelets 97 (*)    Abs Immature Granulocytes 0.13 (*)    All other components within normal limits  BASIC METABOLIC PANEL - Abnormal; Notable for the following components:   Sodium 134 (*)    Chloride 95 (*)    Glucose, Bld 183 (*)    BUN 49 (*)    Creatinine, Ser 6.30 (*)    Calcium 8.7 (*)    GFR, Estimated 10 (*)    All other components within normal limits  BRAIN NATRIURETIC PEPTIDE - Abnormal; Notable for the following components:   B Natriuretic Peptide >4,500.0 (*)    All other components within normal limits  TROPONIN I (HIGH SENSITIVITY) - Abnormal; Notable for the following components:   Troponin I (High Sensitivity) 34 (*)    All other components within normal limits  TROPONIN I (HIGH SENSITIVITY) - Abnormal; Notable for the following components:   Troponin I (High Sensitivity) 30 (*)    All other components within normal limits  RESP PANEL BY RT-PCR (FLU A&B, COVID) ARPGX2  CULTURE, BLOOD (ROUTINE X 2)  CULTURE, BLOOD (ROUTINE X 2)  PROCALCITONIN  TYPE AND SCREEN   ____________________________________________  EKG  ED ECG REPORT I, Rudene Re, the attending physician, personally viewed and interpreted this ECG.  Sinus rhythm with a rate of 68, LVH with repolarization abnormalities, prolonged QTC, no ST elevations.  Unchanged when compared to prior from last admission ____________________________________________  RADIOLOGY  I have personally reviewed the images performed during this visit and I agree with the Radiologist's read.   Interpretation by Radiologist:  DG Chest Port 1 View  Result Date: 05/18/2021  CLINICAL DATA:  Shortness of breath. EXAM: PORTABLE CHEST 1 VIEW COMPARISON:  Chest x-ray 05/14/2021, CT chest  05/13/2021 FINDINGS: Redemonstration of vascular stents overlying the mediastinum as well as left axilla. Enlarged cardiac silhouette. The heart size and mediastinal contours are grossly unchanged. Aortic calcification. No focal consolidation. No pulmonary edema. Interval increase in size of a moderate volume left pleural effusion. At least trace right pleural effusion. Patchy interstitial and airspace opacities. No pneumothorax. No acute osseous abnormality. IMPRESSION: 1. Interval increase in size of a moderate volume left pleural effusion. 2. At least trace right pleural effusion. 3. Patchy interstitial and airspace opacities. Findings could represent infection versus inflammation or pulmonary edema. Electronically Signed   By: Iven Finn M.D.   On: 05/18/2021 02:19     ____________________________________________   PROCEDURES  Procedure(s) performed:yes .1-3 Lead EKG Interpretation  Date/Time: 05/18/2021 2:10 AM Performed by: Rudene Re, MD Authorized by: Rudene Re, MD     Interpretation: abnormal     ECG rate assessment: normal     Rhythm: sinus rhythm     Ectopy: none     Conduction: abnormal    Critical Care performed: yes  CRITICAL CARE Performed by: Rudene Re  ?  Total critical care time: 30 min  Critical care time was exclusive of separately billable procedures and treating other patients.  Critical care was necessary to treat or prevent imminent or life-threatening deterioration.  Critical care was time spent personally by me on the following activities: development of treatment plan with patient and/or surrogate as well as nursing, discussions with consultants, evaluation of patient's response to treatment, examination of patient, obtaining history from patient or surrogate, ordering and performing treatments and interventions, ordering and review of laboratory studies, ordering and review of radiographic studies, pulse oximetry and  re-evaluation of patient's condition.  ____________________________________________   INITIAL IMPRESSION / ASSESSMENT AND PLAN / ED COURSE  60 y.o. male with a history of ESRD on HD, diabetes, empyema, intracerebral hemorrhage, hypertension, stroke, dementia who presents from home for shortness of breath and orthopnea.  Patient recently admitted for the same in the setting of volume overload and possible pneumonia.  According to the wife patient symptoms have not improved since his past admission.  This evening he was hypoxic to 82% when EMS arrived.  Patient does not use oxygen at home.  No missed dialysis.  Upon arrival to the emergency room patient was weaned down to 4 L nasal cannula maintaining sats in the mid 90s.  He has severely diminished breath sounds bilaterally with no edema on his lower extremities.  Differential diagnoses including CHF exacerbation versus pneumonia versus COVID versus flu versus ACS versus PE  Patient placed on telemetry for close monitoring of cardiorespiratory status.  History gathered mostly from his wife's at bedside.  Old medical records reviewed.  Will get chest x-ray, labs, anticipate admission to the hospital.  _________________________ 3:51 AM on 05/18/2021 ----------------------------------------- Chest x-ray showing worsening pleural effusion with questionable pneumonia.  Since patient procalcitonin is trending up and he was treated with antibiotics on prior admission will cover with Levaquin.  BNP remains extremely elevated at greater than 4500.  COVID and flu negative.  Patient will most likely need a thoracentesis.  Remains on 4 L oxygen.  Will admit to the hospitalist service for    _____________________________________________ Please note:  Patient was evaluated in Emergency Department today for the symptoms described in the history of present illness. Patient was evaluated in the context of the global  COVID-19 pandemic, which necessitated  consideration that the patient might be at risk for infection with the SARS-CoV-2 virus that causes COVID-19. Institutional protocols and algorithms that pertain to the evaluation of patients at risk for COVID-19 are in a state of rapid change based on information released by regulatory bodies including the CDC and federal and state organizations. These policies and algorithms were followed during the patient's care in the ED.  Some ED evaluations and interventions may be delayed as a result of limited staffing during the pandemic.   [place] Controlled Substance Database was reviewed by me. ____________________________________________   FINAL CLINICAL IMPRESSION(S) / ED DIAGNOSES   Final diagnoses:  Acute respiratory failure with hypoxia (HCC)  Pleural effusion  Healthcare-associated pneumonia  Acute on chronic anemia      NEW MEDICATIONS STARTED DURING THIS VISIT:  ED Discharge Orders     None        Note:  This document was prepared using Dragon voice recognition software and may include unintentional dictation errors.    Alfred Levins, [place], MD 05/18/21 [place], [place], MD 05/18/21 [phone]

## 2021-05-18 NOTE — Progress Notes (Signed)
OT Cancellation Note  Patient Details Name: [name] MRN: [id number] DOB: Nov 09, 1960   Cancelled Treatment:    Reason Eval/Treat Not Completed: Other (comment). Pt s/p thoracentesis, now has orders for stat chest xray. Also note pt needs dialysis today, per chart. Will hold OT evaluation today and re-attempt next date as appropriate.   Hanley Hays, MPH, MS, OTR/L ascom [phone] 05/18/21, 12:12 PM

## 2021-05-18 NOTE — Progress Notes (Signed)
Pt. unable to tolerated tx d/t SOB and desaturation, SpO2<70, O2@6  LPM via [place]. RR called, non rebreather applied SpO2 @ 80-90%, pt transferred to ICU. Nephrology team notified.

## 2021-05-18 NOTE — Progress Notes (Signed)
Brief hospitalist update note.  This is a nonbillable note.  Please see same-day H&P from Dr. Hal Hope for full billable details.  Briefly, this is a 60 year old male history of ESRD on hemodialysis MWF, hypertension, seizure disorder, anemia, systolic chronic heart failure, hidradenitis on [name], bilateral BKA who presents to the ED for evaluation of worsening shortness of breath.  Patient was recently admitted and discharged on 7/29 for similar complaints.  Now presents with persistent fluid overload, possible pneumonia associated with parapneumonic effusion.  Patient is chronically ill and majority of history is gained by speaking with patient's wife.  Patient's wife states that shortness of breath progressively worsened after discharge.  Patient has no associated chest pain, productive cough, fever, chills.  Initially required 10 L oxygen to maintain saturation greater than 90.  Hemoglobin also dropped to 6.8 from baseline approximately 8.  Started on antibiotics for pneumonia, thoracentesis ordered, nephrology consulted  Ralene Muskrat MD  No charge

## 2021-05-18 NOTE — Progress Notes (Signed)
PT Cancellation Note  Patient Details Name: [name] MRN: [id number] DOB: [birth year]   Cancelled Treatment:    Reason Eval/Treat Not Completed: Other (comment). Pt s/p thoracentesis, now has orders for stat chest xray. Also note pt needs dialysis today, per chart. Will hold PT evaluation today and re-attempt next date as appropriate.    Lieutenant Diego PT, DPT 12:23 PM,05/18/21

## 2021-05-18 NOTE — Progress Notes (Signed)
Pharmacy Antibiotic Note  [name] is a 60 y.o. male w/ ESRD on MWF dialysis, admitted on 05/18/2021 with  HCAP .  Pharmacy has been consulted for Levaquin dosing.  Plan: Pt given Levaquin 750 mg once in ED.  Ordering Levaquin 500 mg q48h per indication and ESRD HD status.  Pharmacy will continue to follow.  Height: 5' 6"  (167.6 cm) Weight: 56.7 kg (125 lb) IBW/kg (Calculated) : 63.8  No data recorded.  Recent Labs  Lab 05/11/21 1628 05/12/21 1928 05/15/21 0649 05/18/21 0143  WBC 5.8 6.2  --  6.1  CREATININE 3.63* 5.49* 5.29* 6.30*    Estimated Creatinine Clearance: 10.3 mL/min (A) (by C-G formula based on SCr of 6.3 mg/dL (H)).    Allergies  Allergen Reactions   Methotrexate Other (See Comments)    Blood count drops   Vancomycin Shortness Of Breath    Eyes watering, SOB, wheezing   Cefepime Other (See Comments)   Tape     Antimicrobials this admission: 08/01 Levaquin >>   Microbiology results: 08/01 BCx: Pending  Thank you for allowing pharmacy to be a part of this patient's care.  Renda Rolls, PharmD, [facility] 05/18/2021 6:03 AM

## 2021-05-18 NOTE — Progress Notes (Signed)
[facility]  ROUNDING NOTE   Subjective:   [name] is a 60 y.o. male with past medical history of anemia, diabetes, encephalopathy, hypertension,stroke and ESRD on dialysis. Patient presented to ED with shortness of breath and cough. He has been admitted to [facility] for SOB (shortness of breath) [R06.02] Pleural effusion [J90] Healthcare-associated pneumonia [J18.9] Acute respiratory failure with hypoxia ([place]) [J96.01] Acute on chronic anemia [D64.9]   Patient seen siting up in bed Wife at bedside  Appears disoriented and anxious Currently on 5L [place] Poor appetite   Objective:  Vital signs in last 24 hours:  Temp:  [97.7 F (36.5 C)-97.8 F (36.6 C)] 97.8 F (36.6 C) (08/01 0751) Pulse Rate:  [64-79] 79 (08/01 0751) Resp:  [15-18] 18 (08/01 0751) BP: (143-165)/(62-89) 162/78 (08/01 0751) SpO2:  [91 %-100 %] 91 % (08/01 0751) Weight:  [56.7 kg] 56.7 kg (08/01 0716)  Weight change:  Filed Weights   05/18/21 0135 05/18/21 0716  Weight: 56.7 kg 56.7 kg    Intake/Output: No intake/output data recorded.   Intake/Output this shift:  No intake/output data recorded.  Physical Exam: General: NAD, sitting up in bed  Head: Normocephalic, atraumatic. Moist oral mucosal membranes  Eyes: Anicteric  Lungs:  Bilateral crackles, normal breathing effort, [place] O2  Heart: Regular rate and rhythm  Abdomen:  Soft, nontender  Extremities:  trace peripheral edema.  Neurologic: Alert, disoriented, moving all four extremities  Skin: No lesions  Access: LUE AVF    Basic Metabolic Panel: Recent Labs  Lab 05/11/21 1628 05/12/21 1928 05/15/21 0649 05/18/21 0143 05/18/21 0547  NA 140 136 136 134*  --   K 3.3* 3.3* 4.0 4.6  --   CL 96 95* 95* 95*  --   CO2 31* 30 28 27   --   GLUCOSE 217* 170* 204* 183*  --   BUN 22 37* 40* 49*  --   CREATININE 3.63* 5.49* 5.29* 6.30* 6.48*  CALCIUM 8.2* 8.2* 8.8* 8.7*  --      Liver Function Tests: Recent Labs  Lab  05/11/21 1628  AST 14  ALT 4  ALKPHOS 79  BILITOT 0.5  PROT 7.0  ALBUMIN 3.3*    No results for input(s): LIPASE, AMYLASE in the last 168 hours. No results for input(s): AMMONIA in the last 168 hours.  CBC: Recent Labs  Lab 05/11/21 1628 05/12/21 1928 05/18/21 0143 05/18/21 0547  WBC 5.8 6.2 6.1 6.9  NEUTROABS  --   --  4.5  --   HGB 8.0* 8.2* 6.8* 7.8*  HCT 25.8* 26.8* 22.8* 25.9*  MCV 79 83.0 84.8 83.5  PLT 136* 154 97* 123*     Cardiac Enzymes: No results for input(s): CKTOTAL, CKMB, CKMBINDEX, TROPONINI in the last 168 hours.  BNP: Invalid input(s): POCBNP  CBG: Recent Labs  Lab 05/14/21 1618 05/14/21 2116 05/15/21 0816 05/18/21 0657 05/18/21 0752  GLUCAP 150* 166* 196* 184* 189*     Microbiology: Results for orders placed or performed during the hospital encounter of 05/18/21  Resp Panel by RT-PCR (Flu A&B, Covid) Nasopharyngeal Swab     Status: None   Collection Time: 05/18/21  1:43 AM   Specimen: Nasopharyngeal Swab; Nasopharyngeal(NP) swabs in vial transport medium  Result Value Ref Range Status   SARS Coronavirus 2 by RT PCR NEGATIVE NEGATIVE Final    Comment: (NOTE) SARS-CoV-2 target nucleic acids are NOT DETECTED.  The SARS-CoV-2 RNA is generally detectable in upper respiratory specimens during the acute phase of  infection. The lowest concentration of SARS-CoV-2 viral copies this assay can detect is 138 copies/mL. A negative result does not preclude SARS-Cov-2 infection and should not be used as the sole basis for treatment or other patient management decisions. A negative result may occur with  improper specimen collection/handling, submission of specimen other than nasopharyngeal swab, presence of viral mutation(s) within the areas targeted by this assay, and inadequate number of viral copies(<138 copies/mL). A negative result must be combined with clinical observations, patient history, and epidemiological information. The expected  result is Negative.  Fact Sheet for Patients:  EntrepreneurPulse.com.au  Fact Sheet for Healthcare Providers:  IncredibleEmployment.be  This test is no t yet approved or cleared by the [place] FDA and  has been authorized for detection and/or diagnosis of SARS-CoV-2 by FDA under an Emergency Use Authorization (EUA). This EUA will remain  in effect (meaning this test can be used) for the duration of the COVID-19 declaration under Section 564(b)(1) of the Act, 21 U.S.C.section 360bbb-3(b)(1), unless the authorization is terminated  or revoked sooner.       Influenza A by PCR NEGATIVE NEGATIVE Final   Influenza B by PCR NEGATIVE NEGATIVE Final    Comment: (NOTE) The Xpert Xpress SARS-CoV-2/FLU/RSV plus assay is intended as an aid in the diagnosis of influenza from Nasopharyngeal swab specimens and should not be used as a sole basis for treatment. Nasal washings and aspirates are unacceptable for Xpert Xpress SARS-CoV-2/FLU/RSV testing.  Fact Sheet for Patients: EntrepreneurPulse.com.au  Fact Sheet for Healthcare Providers: IncredibleEmployment.be  This test is not yet approved or cleared by the [place] FDA and has been authorized for detection and/or diagnosis of SARS-CoV-2 by FDA under an Emergency Use Authorization (EUA). This EUA will remain in effect (meaning this test can be used) for the duration of the COVID-19 declaration under Section 564(b)(1) of the Act, 21 U.S.C. section 360bbb-3(b)(1), unless the authorization is terminated or revoked.  Performed at [facility], [address] [place], [place] [address]   Blood culture (routine x 2)     Status: None (Preliminary result)   Collection Time: 05/18/21  1:44 AM   Specimen: BLOOD  Result Value Ref Range Status   Specimen Description BLOOD RIGHT ANTECUBITAL  Final   Special Requests   Final    BOTTLES DRAWN AEROBIC AND  ANAEROBIC Blood Culture adequate volume   Culture   Final    NO GROWTH < 12 HOURS Performed at [facility], [address] [place], [place] [address]    Report Status PENDING  Incomplete  Blood culture (routine x 2)     Status: None (Preliminary result)   Collection Time: 05/18/21  1:44 AM   Specimen: BLOOD  Result Value Ref Range Status   Specimen Description BLOOD BLOOD RIGHT FOREARM  Final   Special Requests   Final    BOTTLES DRAWN AEROBIC AND ANAEROBIC Blood Culture adequate volume   Culture   Final    NO GROWTH < 12 HOURS Performed at [facility], [address] [place], [place] [address]    Report Status PENDING  Incomplete    Coagulation Studies: No results for input(s): LABPROT, INR in the last 72 hours.  Urinalysis: No results for input(s): COLORURINE, LABSPEC, PHURINE, GLUCOSEU, HGBUR, BILIRUBINUR, KETONESUR, PROTEINUR, UROBILINOGEN, NITRITE, LEUKOCYTESUR in the last 72 hours.  Invalid input(s): APPERANCEUR    Imaging: CT CHEST WO CONTRAST  Result Date: 05/18/2021 CLINICAL DATA:  60 year old male with history of chest pain or  shortness of breath. EXAM: CT CHEST WITHOUT CONTRAST TECHNIQUE: Multidetector CT imaging of the chest was performed following the standard protocol without IV contrast. COMPARISON:  Chest CT 05/13/2021. FINDINGS: Cardiovascular: Heart size is enlarged. There is no significant pericardial fluid, thickening or pericardial calcification. There is aortic atherosclerosis, as well as atherosclerosis of the great vessels of the mediastinum and the coronary arteries, including calcified atherosclerotic plaque in the left main, left anterior descending, left circumflex and right coronary arteries. Calcifications of the aortic valve and mitral annulus. Vascular stent in the left innominate vein. Mediastinum/Nodes: Assessment for lymphadenopathy is exceedingly limited on today's noncontrast CT examination, particularly in light of the  patient's thin body habitus. Thickening of the esophagus most notably in the mid esophagus (axial image 41 of series 2), poorly evaluated on today's noncontrast CT examination. No axillary lymphadenopathy. Lungs/Pleura: Right-sided pleural calcification. No left pleural calcification identified. Small right and moderate left pleural effusions. The majority of the left pleural effusion is sub pulmonic in distribution. There continues to be generalized mild ground-glass attenuation and interlobular septal thickening in the lungs bilaterally suggesting a background of mild interstitial pulmonary edema. Compared to the prior study, the left lower lobe is now opacified with only a small amount of volume loss, indicating predominantly airspace consolidation rather than simple atelectasis. Upper Abdomen: Aortic atherosclerosis. Musculoskeletal: There are no aggressive appearing lytic or blastic lesions noted in the visualized portions of the skeleton. IMPRESSION: 1. There continues to be evidence of congestive heart failure, as above. However, today's study also demonstrates new airspace consolidation in the left lower lobe concerning for pneumonia. 2. Small right and moderate left pleural effusions. Left pleural effusion has increased substantially in size and is predominantly sub pulmonic in location, potentially loculated. 3. Aortic atherosclerosis, in addition to left main and 3 vessel coronary artery disease. Please note that although the presence of coronary artery calcium documents the presence of coronary artery disease, the severity of this disease and any potential stenosis cannot be assessed on this non-gated CT examination. Assessment for potential risk factor modification, dietary therapy or pharmacologic therapy may be warranted, if clinically indicated. 4. There are calcifications of the aortic valve and mitral annulus. Echocardiographic correlation for evaluation of potential valvular dysfunction may be  warranted if clinically indicated. Aortic Atherosclerosis (ICD10-I70.0). Electronically Signed   By: Vinnie Langton M.D.   On: 05/18/2021 05:33   DG Chest Port 1 View  Result Date: 05/18/2021 CLINICAL DATA:  Shortness of breath. EXAM: PORTABLE CHEST 1 VIEW COMPARISON:  Chest x-ray 05/14/2021, CT chest 05/13/2021 FINDINGS: Redemonstration of vascular stents overlying the mediastinum as well as left axilla. Enlarged cardiac silhouette. The heart size and mediastinal contours are grossly unchanged. Aortic calcification. No focal consolidation. No pulmonary edema. Interval increase in size of a moderate volume left pleural effusion. At least trace right pleural effusion. Patchy interstitial and airspace opacities. No pneumothorax. No acute osseous abnormality. IMPRESSION: 1. Interval increase in size of a moderate volume left pleural effusion. 2. At least trace right pleural effusion. 3. Patchy interstitial and airspace opacities. Findings could represent infection versus inflammation or pulmonary edema. Electronically Signed   By: Iven Finn M.D.   On: 05/18/2021 02:19     Medications:    [START ON 05/20/2021] levofloxacin (LEVAQUIN) IV      amLODipine  10 mg Oral Daily   aspirin  81 mg Oral Daily   atorvastatin  80 mg Oral Daily   carvedilol  25 mg Oral BID   Chlorhexidine  Gluconate Cloth  6 each Topical Q0600   cloNIDine  0.2 mg Oral BID   feeding supplement  237 mL Oral TID BM   furosemide  80 mg Oral BID   gabapentin  400 mg Oral QHS   heparin  5,000 Units Subcutaneous Q8H   hydrALAZINE  100 mg Oral Q8H   insulin aspart  0-6 Units Subcutaneous TID WC   levETIRAcetam  500 mg Oral Daily   lisinopril  40 mg Oral Daily   multivitamin  1 tablet Oral QHS   sevelamer carbonate  800 mg Oral TID WC   spironolactone  25 mg Oral Daily   acetaminophen **OR** acetaminophen  Assessment/ Plan:  Mr. Latravion Graves Yingst is a 60 y.o.  male past medical history of anemia, diabetes, encephalopathy,  hypertension,stroke and ESRD on dialysis. Patient presented to ED with shortness of breath and cough. He has been admitted to [facility] for SOB (shortness of breath) [R06.02] Pleural effusion [J90] Healthcare-associated pneumonia [J18.9] Acute respiratory failure with hypoxia ([place]) [J96.01] Acute on chronic anemia [D64.9]   [facility]/MWF/LUE AVF  End stage renal disease on dialysis  Will maintain outpatient schedule, if possible Will receive dialysis today, no U. Next treatment scheduled for Wednesday  2. Anemia of chronic kidney disease  Lab Results  Component Value Date   HGB 7.8 (L) 05/18/2021  Hgb improved this am   3. Secondary Hyperparathyroidism:  Lab Results  Component Value Date   PTH 283 (H) 01/08/2019   CALCIUM 8.7 (L) 05/18/2021   CAION 1.10 (L) 09/12/2020   PHOS 3.7 11/21/2020  [place] outpatient  Will obtain an up to date phosphorus [place] with meals    LOS: 0   8/1/202210:36 AM

## 2021-05-18 NOTE — Progress Notes (Signed)
OT Cancellation Note  Patient Details Name: [name] MRN: [id number] DOB: 05-03-1961   Cancelled Treatment:    Reason Eval/Treat Not Completed: Medical issues which prohibited therapy;Patient not medically ready. Chart reviewed. Pt noted to have rapid response while in HD. Transferred to ICU. Due to change in medical status resulting in transfer to higher level of care, will complete current OT order. Please re-consult at later date/time as pt is medically appropriate for therapy intervention.   Hanley Hays, MPH, MS, OTR/L ascom [phone] 05/18/21, 4:53 PM

## 2021-05-18 NOTE — Care Management (Signed)
Received page from ICU charge RN.  Patient had rapid response called while in hemodialysis.  I went and evaluated the patient at bedside.  Found him to be on a nonrebreather with saturations in the low to mid 80s.  Work of breathing not especially increased.  Lung sounds very coarse and rhonchorous.  Unable to wean off nonrebreather.  Of note patient did get a thoracentesis with 1 L fluid removed today.  No fluid removal was attempted with hemodialysis.  Patient only completed about 30 minutes of his dialysis session.  Will be transferred to stepdown unit for closer monitoring.  BiPAP initiated.  COPD treatment started.  Case discussed at length with ICU attending.  Consultation greatly appreciated  Ralene Muskrat MD

## 2021-05-18 NOTE — Progress Notes (Signed)
Initial Nutrition Assessment  DOCUMENTATION CODES:  Severe malnutrition in context of chronic illness  INTERVENTION:  Liberalize diet to carb modified to encourage PO intake Rena-vit po daily  Nepro Shake po TID, each supplement provides 425 kcal and 19 grams protein  NUTRITION DIAGNOSIS:  Severe Malnutrition related to chronic illness (ESRD on HD) as evidenced by severe fat depletion, severe muscle depletion.  GOAL:  Patient will meet greater than or equal to 90% of their needs  MONITOR:  PO intake, Supplement acceptance, I & O's, Labs, Weight trends  REASON FOR ASSESSMENT:  Malnutrition Screening Tool, Consult Assessment of nutrition requirement/status  ASSESSMENT:  60 y/o male with h/o CHF, ESRD on HD, bilateral BKAs, DM, HTN, HLD, hx of CVA, and Crohns presented to ED with SOB. Admitted with similar presentation last week.  Pt followed by RD during last admission. Last assessment 7/28.   Pt being assisted with care at the first attempted visit and in HD at the second attempt. Discussed in rounds, pt to go for thoracentesis today (noted 1L clear dark yellow fluid removed).  Will liberalize diet and add supplements and vitamins to help pt meet his estimated needs and to support losses from HD. Electrolytes currently WNL.  Nutritionally Relevant Medications: Scheduled Meds:  atorvastatin  80 mg Oral Daily   feeding supplement  237 mL Oral TID BM   furosemide  80 mg Oral BID   insulin aspart  0-6 Units Subcutaneous TID WC   multivitamin  1 tablet Oral QHS   sevelamer carbonate  800 mg Oral TID WC   spironolactone  25 mg Oral Daily   Labs Reviewed: Na 134 BUN 49, creatinine 6.3  NUTRITION - FOCUSED PHYSICAL EXAM: Flowsheet Row Most Recent Value  Orbital Region Severe depletion  Upper Arm Region Severe depletion  Thoracic and Lumbar Region Severe depletion  Buccal Region Severe depletion  Temple Region Severe depletion  Clavicle Bone Region Severe depletion   Clavicle and Acromion Bone Region Severe depletion  Scapular Bone Region Severe depletion  Dorsal Hand Severe depletion  Patellar Region Severe depletion  Anterior Thigh Region Severe depletion  Posterior Calf Region Severe depletion  Edema (RD Assessment) None  Hair Reviewed  Eyes Reviewed  Mouth Reviewed  Skin Reviewed  Nails Reviewed   Diet Order:   Diet Order             Diet Carb Modified Fluid consistency: Thin; Room service appropriate? Yes; Fluid restriction: 2000 mL Fluid  Diet effective now                  EDUCATION NEEDS:  No education needs have been identified at this time  Skin:  Skin Assessment: Reviewed RN Assessment  Last BM:  8/1  Height:  Ht Readings from Last 1 Encounters:  05/18/21 5' 11"  (1.803 m)    Weight:  Wt Readings from Last 1 Encounters:  05/18/21 58.9 kg    Ideal Body Weight:  69 kg (adjusted by 11.8% for bilateral BKA)  BMI:  Body mass index is 19.8 kg/m. (Using 8/1 wt, 56.7 kg, adjusted for BKAs)  Estimated Nutritional Needs:  Kcal:  1900-2200 kcal/d Protein:  95-105 g/d Fluid:  UOP+1L/d  Ranell Patrick, RD, LDN Clinical Dietitian Pager on Amion

## 2021-05-18 NOTE — H&P (Signed)
History and Physical    Rc Amison Grilli ONG:[id number] DOB: 03-06-1961 DOA: 05/18/2021  PCP: [name], MD  [name] coming from: Home.  Chief Complaint: Shortness of breath.  HPI: [name] is a 60 y.o. male with history of ESRD on hemodialysis on Monday Wednesday Brady, [name], [name], [name], [name], [name] presents to the ER because of worsening shortness of breath.  [name] was recently admitted and discharged about 2 days ago after being treated for fluid overload and possible pneumonia.  Per [name]'s wife who provided most of the history [name] has been getting progressively short of breath over the last 2 days with no associated chest pain or productive cough fever chills.  ED Course: In the ER initially [name] required 10 L of oxygen to maintain sats more than 90%.  Chest x-ray shows worsening left-sided pleural effusion with possible consolidation.  Labs also show drop in hemoglobin from 8 it is around 6.8 now.  COVID test was negative.  [name] was started on antibiotics for pneumonia admitted for further work-up.  Review of Systems: As per HPI, rest all negative.   Past Medical History:  Diagnosis Date   Acute metabolic encephalopathy 01/19/101   [name]    Crohn disease ([place])    Diabetes mellitus without complication ([place])    DVT of lower extremity (deep venous thrombosis) ([place]) 2016   Empyema ([place]) 05/20/2017   Encephalopathy 12/04/2017   Fall at home, initial encounter 09/12/2020   Hidradenitis suppurativa    [name]    ICH (intracerebral hemorrhage) ([place])    Peritonitis ([place]) 04/21/2017   Pyogenic arthritis of knee ([place]) 02/04/2016   Renal Brady    Sepsis ([place]) 01/12/2018   Stroke [facility])     Past Surgical History:  Procedure Laterality Date   A/V FISTULAGRAM Left 02/24/2021   Procedure: A/V FISTULAGRAM;  Surgeon: Katha Cabal, MD;  Location: [place] [facility];  Service: Cardiovascular;   Laterality: Left;   ABDOMINAL SURGERY     AMPUTATION FINGER Left 06/2019   PR AMPUTATION LONG FINGER/THUMB+FLAPS [facility]   ANGIOPLASTY Left    left fem[facility]pop [facility] 04-11-2018   BELOW KNEE LEG AMPUTATION Right 08/2017   [facility]   COLONOSCOPY     COLONOSCOPY WITH PROPOFOL N/A 10/28/2020   Procedure: COLONOSCOPY WITH PROPOFOL;  Surgeon: Lin Landsman, MD;  Location: [place];  Service: Gastroenterology;  Laterality: N/A;   COLONOSCOPY WITH PROPOFOL N/A 11/21/2020   Procedure: COLONOSCOPY WITH PROPOFOL;  Surgeon: [name], MD;  Location: [facility] ENDOSCOPY;  Service: Endoscopy;  Laterality: N/A;   DIALYSIS/PERMA CATHETER INSERTION N/A 12/09/2017   Procedure: DIALYSIS/PERMA CATHETER INSERTION;  Surgeon: Katha Cabal, MD;  Location: [place] [facility];  Service: Cardiovascular;  Laterality: N/A;   DIALYSIS/PERMA CATHETER INSERTION N/A 12/12/2017   Procedure: DIALYSIS/PERMA CATHETER INSERTION;  Surgeon: Algernon Huxley, MD;  Location: [place] CV [facility];  Service: Cardiovascular;  Laterality: N/A;   DIALYSIS/PERMA CATHETER REMOVAL Left 12/09/2017   Procedure: DIALYSIS/PERMA CATHETER REMOVAL;  Surgeon: Katha Cabal, MD;  Location: [place] [facility];  Service: Cardiovascular;  Laterality: Left;   ESOPHAGOGASTRODUODENOSCOPY (EGD) WITH PROPOFOL N/A 11/20/2020   Procedure: ESOPHAGOGASTRODUODENOSCOPY (EGD) WITH PROPOFOL;  Surgeon: [name], MD;  Location: [facility] ENDOSCOPY;  Service: Endoscopy;  Laterality: N/A;   KNEE SURGERY Left 02/04/2016   [facility]   LEG AMPUTATION THROUGH LOWER TIBIA AND FIBULA Left 06/22/2018   [facility]   LOWER EXTREMITY ANGIOGRAPHY Right 08/08/2017   Procedure:  Lower Extremity Angiography;  Surgeon: Algernon Huxley, MD;  Location: [place] [facility];  Service: Cardiovascular;  Laterality: Right;   LOWER EXTREMITY ANGIOGRAPHY Right 08/22/2017   Procedure: Lower Extremity Angiography;  Surgeon: Algernon Huxley, MD;  Location: [place] [facility];  Service: Cardiovascular;   Laterality: Right;   LOWER EXTREMITY INTERVENTION  08/08/2017   Procedure: LOWER EXTREMITY INTERVENTION;  Surgeon: Algernon Huxley, MD;  Location: [place] [facility];  Service: Cardiovascular;;   LOWER EXTREMITY INTERVENTION  08/22/2017   Procedure: LOWER EXTREMITY INTERVENTION;  Surgeon: Algernon Huxley, MD;  Location: [place] [facility];  Service: Cardiovascular;;     reports that he has quit smoking. He has never used smokeless tobacco. He reports current drug use. Drug: Marijuana. He reports that he does not drink alcohol.  Allergies  Allergen Reactions   Methotrexate Other (See Comments)    Blood count drops   Vancomycin Shortness Of Breath    Eyes watering, SOB, wheezing   Cefepime Other (See Comments)   Tape     Family History  Problem Relation Age of Onset   Irritable bowel syndrome Sister    Diabetes Sister    Heart disease Mother    Diabetes Mother    Heart disease Father    Rheumatic fever Father        as child   Psoriasis Brother    Arthritis Brother    Diabetes Sister    Diabetes Sister     Prior to Admission medications   Medication Sig Start Date End Date Taking? Authorizing Provider  acetaminophen (TYLENOL) 500 MG tablet Take 1,000 mg by mouth daily as needed for moderate pain or headache.    Yes [provider]  Alcohol Swabs PADS Use as directed to check blood sugar three times daily for insulin dependent type 2 diabetes. 10/20/17  Yes [name], MD  amLODipine (NORVASC) 10 MG tablet TAKE 1 TABLET(10 MG) BY MOUTH DAILY AS NEEDED [name] taking differently: Take 10 mg by mouth daily. 03/02/21  Yes [name], MD  aspirin 81 MG chewable tablet Chew 81 mg by mouth daily.   Yes [provider]  atorvastatin (LIPITOR) 80 MG tablet TAKE 1 TABLET(80 MG) BY MOUTH DAILY 06/25/20  Yes [name], MD  AURYXIA 1 GM 210 MG(Fe) tablet Take 420 mg by mouth in the morning and at bedtime.  10/23/18  Yes [provider]  Blood Glucose  Monitoring Suppl (ONE TOUCH ULTRA 2) w/Device KIT Use as directed to check blood sugar three times daily. E11.9 02/20/18  Yes [name], MD  carvedilol (COREG) 25 MG tablet Take 1 tablet (25 mg total) by mouth 2 (two) times daily. 06/27/20  Yes [name], MD  cloNIDine (CATAPRES) 0.2 MG tablet Take 1 tablet (0.2 mg total) by mouth 2 (two) times daily. 05/15/21  Yes Lorella Nimrod, MD  feeding supplement (ENSURE ENLIVE / ENSURE PLUS) LIQD Take 237 mLs by mouth 3 (three) times daily between meals. 05/15/21  Yes Lorella Nimrod, MD  furosemide (LASIX) 80 MG tablet Take 1 tablet (80 mg total) by mouth 2 (two) times daily. 03/10/21  Yes [name], MD  gabapentin (NEURONTIN) 100 MG capsule Take 100 mg by mouth at bedtime. Take along with 333m capsule at bedtime   Yes [provider]  hydrALAZINE (APRESOLINE) 100 MG tablet Take 1 tablet (100 mg total) by mouth every 8 (eight) hours. 05/15/21  Yes ALorella Nimrod MD  levETIRAcetam (KEPPRA) 500 MG tablet Take 1 tablet (500 mg total) by mouth daily. 05/16/21  Yes Lorella Nimrod, MD  lisinopril (ZESTRIL) 40 MG tablet Take 40 mg by mouth daily. 12/17/20  Yes [provider]  multivitamin (RENA-VIT) TABS tablet Take 1 tablet by mouth at bedtime. 05/15/21  Yes Lorella Nimrod, MD  sevelamer carbonate (RENVELA) 800 MG tablet TAKE 1 TABLET(800 MG) BY MOUTH THREE TIMES DAILY 09/22/20  Yes [name], MD  spironolactone (ALDACTONE) 25 MG tablet Take 1 tablet by mouth at bedtime. 12/17/20 12/17/21 Yes [provider]  Adalimumab (Brady PEN) 40 MG/0.4ML PNKT Inject 40 mg into the muscle once a week.  11/27/18   [provider]    Physical Exam: Constitutional: Moderately built and nourished. Vitals:   05/18/21 0134 05/18/21 0135 05/18/21 0300 05/18/21 0330  BP:   (!) 163/83 (!) 143/62  Pulse: 67  65 64  Resp:   17 15  SpO2: 95%  97% 100%  Weight:  56.7 kg    Height:  _0  (1.676 m)     Eyes: Anicteric no pallor. ENMT:  No discharge from the ears eyes nose and mouth: Neck: No mass felt.  No neck rigidity. Respiratory: No rhonchi or crepitations. Cardiovascular: S1-S2 heard. Abdomen: Soft nontender bowel sounds present. Musculoskeletal: No rash. Skin: Has history of hydradenitis Neurologic: Alert awake oriented to place and person moving all extremities. Psychiatric: Oriented to name and place.   Labs on Admission: I have personally reviewed following labs and imaging studies  CBC: Recent Labs  Lab 05/11/21 1628 05/12/21 1928 05/18/21 0143  WBC 5.8 6.2 6.1  NEUTROABS  --   --  4.5  HGB 8.0* 8.2* 6.8*  HCT 25.8* 26.8* 22.8*  MCV 79 83.0 84.8  PLT 136* 154 97*   Basic Metabolic Panel: Recent Labs  Lab 05/11/21 1628 05/12/21 1928 05/15/21 0649 05/18/21 0143  NA 140 136 136 134*  K 3.3* 3.3* 4.0 4.6  CL 96 95* 95* 95*  CO2 31* _1 GLUCOSE 217* 170* 204* 183*  BUN 22 37* 40* 49*  CREATININE 3.63* 5.49* 5.29* 6.30*  CALCIUM 8.2* 8.2* 8.8* 8.7*   GFR: Estimated Creatinine Clearance: 10.3 mL/min (A) (by C-G formula based on SCr of 6.3 mg/dL (H)). Liver Function Tests: Recent Labs  Lab 05/11/21 1628  AST 14  ALT 4  ALKPHOS 79  BILITOT 0.5  PROT 7.0  ALBUMIN 3.3*   No results for input(s): LIPASE, AMYLASE in the last 168 hours. No results for input(s): AMMONIA in the last 168 hours. Coagulation Profile: No results for input(s): INR, PROTIME in the last 168 hours. Cardiac Enzymes: No results for input(s): CKTOTAL, CKMB, CKMBINDEX, TROPONINI in the last 168 hours. BNP (last 3 results) No results for input(s): PROBNP in the last 8760 hours. HbA1C: No results for input(s): HGBA1C in the last 72 hours. CBG: Recent Labs  Lab 05/14/21 0807 05/14/21 1211 05/14/21 1618 05/14/21 2116 05/15/21 0816  GLUCAP 139* 153* 150* 166* 196*   Lipid Profile: No results for input(s): CHOL, HDL, LDLCALC, TRIG, CHOLHDL, LDLDIRECT in the last 72 hours. Thyroid Function Tests: No  results for input(s): TSH, T4TOTAL, FREET4, T3FREE, THYROIDAB in the last 72 hours. [name] Panel: No results for input(s): VITAMINB12, FOLATE, FERRITIN, TIBC, IRON, RETICCTPCT in the last 72 hours. Urine analysis:    Component Value Date/Time   COLORURINE YELLOW (A) 02/06/2021 2041   APPEARANCEUR CLOUDY (A) 02/06/2021 2041   LABSPEC 1.024 02/06/2021 2041  PHURINE 7.0 02/06/2021 2041   GLUCOSEU 150 (A) 02/06/2021 2041   HGBUR MODERATE (A) 02/06/2021 2041   BILIRUBINUR NEGATIVE 02/06/2021 2041   KETONESUR NEGATIVE 02/06/2021 2041   PROTEINUR >=300 (A) 02/06/2021 2041   NITRITE NEGATIVE 02/06/2021 2041   LEUKOCYTESUR NEGATIVE 02/06/2021 2041   Sepsis Labs: _0 (procalcitonin:4,lacticidven:4) ) Recent Results (from the past 240 hour(s))  Resp Panel by RT-PCR (Flu A&B, Covid) Nasopharyngeal Swab     Status: None   Collection Time: 05/12/21  9:03 PM   Specimen: Nasopharyngeal Swab; Nasopharyngeal(NP) swabs in vial transport medium  Result Value Ref Range Status   SARS Coronavirus 2 by RT PCR NEGATIVE NEGATIVE Final    Comment: (NOTE) SARS-CoV-2 target nucleic acids are NOT DETECTED.  The SARS-CoV-2 RNA is generally detectable in upper respiratory specimens during the acute phase of infection. The lowest concentration of SARS-CoV-2 viral copies this assay can detect is 138 copies/mL. A negative result does not preclude SARS-Cov-2 infection and should not be used as the sole basis for treatment or other [name] management decisions. A negative result may occur with  improper specimen collection/handling, submission of specimen other than nasopharyngeal swab, presence of viral mutation(s) within the areas targeted by this assay, and inadequate number of viral copies(<138 copies/mL). A negative result must be combined with clinical observations, [name] history, and epidemiological information. The expected result is Negative.  Fact Sheet for Patients:   EntrepreneurPulse.com.au  Fact Sheet for Healthcare Providers:  IncredibleEmployment.be  This test is no t yet approved or cleared by the [place] FDA and  has been authorized for detection and/or diagnosis of SARS-CoV-2 by FDA under an Emergency Use Authorization (EUA). This EUA will remain  in effect (meaning this test can be used) for the duration of the COVID-19 declaration under Section 564(b)(1) of the Act, 21 U.S.C.section 360bbb-3(b)(1), unless the authorization is terminated  or revoked sooner.       Influenza A by PCR NEGATIVE NEGATIVE Final   Influenza B by PCR NEGATIVE NEGATIVE Final    Comment: (NOTE) The Xpert Xpress SARS-CoV-2/FLU/RSV plus assay is intended as an aid in the diagnosis of influenza from Nasopharyngeal swab specimens and should not be used as a sole basis for treatment. Nasal washings and aspirates are unacceptable for Xpert Xpress SARS-CoV-2/FLU/RSV testing.  Fact Sheet for Patients: EntrepreneurPulse.com.au  Fact Sheet for Healthcare Providers: IncredibleEmployment.be  This test is not yet approved or cleared by the [place] FDA and has been authorized for detection and/or diagnosis of SARS-CoV-2 by FDA under an Emergency Use Authorization (EUA). This EUA will remain in effect (meaning this test can be used) for the duration of the COVID-19 declaration under Section 564(b)(1) of the Act, 21 U.S.C. section 360bbb-3(b)(1), unless the authorization is terminated or revoked.  Performed at [facility], [address] [place], [place] [address]   MRSA Next Gen by PCR, Nasal     Status: None   Collection Time: 05/12/21 11:06 PM   Specimen: Nasal Mucosa; Nasal Swab  Result Value Ref Range Status   MRSA by PCR Next Gen NOT DETECTED NOT DETECTED Final    Comment: (NOTE) The GeneXpert MRSA Assay (FDA approved for NASAL specimens only), is one component of a  comprehensive MRSA colonization surveillance program. It is not intended to diagnose MRSA infection nor to guide or monitor treatment for MRSA infections. Test performance is not FDA approved in patients less than 14 years old. Performed at [facility], [address] [place], [place] [address]  Culture, blood (routine x 2) Call MD if unable to obtain prior to antibiotics being given     Status: None (Preliminary result)   Collection Time: 05/12/21 11:54 PM   Specimen: BLOOD  Result Value Ref Range Status   Specimen Description BLOOD RIGHT FOREARM  Final   Special Requests   Final    BOTTLES DRAWN AEROBIC ONLY Blood Culture results may not be optimal due to an inadequate volume of blood received in culture bottles   Culture   Final    NO GROWTH 2 DAYS Performed at [facility], [address] [place], [place] [address]    Report Status PENDING  Incomplete  Resp Panel by RT-PCR (Flu A&B, Covid) Nasopharyngeal Swab     Status: None   Collection Time: 05/18/21  1:43 AM   Specimen: Nasopharyngeal Swab; Nasopharyngeal(NP) swabs in vial transport medium  Result Value Ref Range Status   SARS Coronavirus 2 by RT PCR NEGATIVE NEGATIVE Final    Comment: (NOTE) SARS-CoV-2 target nucleic acids are NOT DETECTED.  The SARS-CoV-2 RNA is generally detectable in upper respiratory specimens during the acute phase of infection. The lowest concentration of SARS-CoV-2 viral copies this assay can detect is 138 copies/mL. A negative result does not preclude SARS-Cov-2 infection and should not be used as the sole basis for treatment or other [name] management decisions. A negative result may occur with  improper specimen collection/handling, submission of specimen other than nasopharyngeal swab, presence of viral mutation(s) within the areas targeted by this assay, and inadequate number of viral copies(<138 copies/mL). A negative result must be combined with clinical  observations, [name] history, and epidemiological information. The expected result is Negative.  Fact Sheet for Patients:  EntrepreneurPulse.com.au  Fact Sheet for Healthcare Providers:  IncredibleEmployment.be  This test is no t yet approved or cleared by the [place] FDA and  has been authorized for detection and/or diagnosis of SARS-CoV-2 by FDA under an Emergency Use Authorization (EUA). This EUA will remain  in effect (meaning this test can be used) for the duration of the COVID-19 declaration under Section 564(b)(1) of the Act, 21 U.S.C.section 360bbb-3(b)(1), unless the authorization is terminated  or revoked sooner.       Influenza A by PCR NEGATIVE NEGATIVE Final   Influenza B by PCR NEGATIVE NEGATIVE Final    Comment: (NOTE) The Xpert Xpress SARS-CoV-2/FLU/RSV plus assay is intended as an aid in the diagnosis of influenza from Nasopharyngeal swab specimens and should not be used as a sole basis for treatment. Nasal washings and aspirates are unacceptable for Xpert Xpress SARS-CoV-2/FLU/RSV testing.  Fact Sheet for Patients: EntrepreneurPulse.com.au  Fact Sheet for Healthcare Providers: IncredibleEmployment.be  This test is not yet approved or cleared by the [place] FDA and has been authorized for detection and/or diagnosis of SARS-CoV-2 by FDA under an Emergency Use Authorization (EUA). This EUA will remain in effect (meaning this test can be used) for the duration of the COVID-19 declaration under Section 564(b)(1) of the Act, 21 U.S.C. section 360bbb-3(b)(1), unless the authorization is terminated or revoked.  Performed at [facility], [address], [place], [place] [address]      Radiological Exams on Admission: CT CHEST WO CONTRAST  Result Date: 05/18/2021 CLINICAL DATA:  60 year old male with history of chest pain or shortness of breath. EXAM: CT CHEST WITHOUT  CONTRAST TECHNIQUE: Multidetector CT imaging of the chest was performed following the standard protocol without IV contrast. COMPARISON:  Chest CT 05/13/2021. FINDINGS: Cardiovascular: Heart  size is enlarged. There is no significant pericardial fluid, thickening or pericardial calcification. There is aortic atherosclerosis, as well as atherosclerosis of the great vessels of the mediastinum and the coronary arteries, including calcified atherosclerotic plaque in the left main, left anterior descending, left circumflex and right coronary arteries. Calcifications of the aortic valve and mitral annulus. Vascular stent in the left innominate vein. Mediastinum/Nodes: Assessment for lymphadenopathy is exceedingly limited on today's noncontrast CT examination, particularly in light of the [name]'s thin body habitus. Thickening of the esophagus most notably in the mid esophagus (axial image 41 of series 2), poorly evaluated on today's noncontrast CT examination. No axillary lymphadenopathy. Lungs/Pleura: Right-sided pleural calcification. No left pleural calcification identified. Small right and moderate left pleural effusions. The majority of the left pleural effusion is sub pulmonic in distribution. There continues to be generalized mild ground-glass attenuation and interlobular septal thickening in the lungs bilaterally suggesting a background of mild interstitial pulmonary edema. Compared to the prior study, the left lower lobe is now opacified with only a small amount of volume loss, indicating predominantly airspace consolidation rather than simple atelectasis. Upper Abdomen: Aortic atherosclerosis. Musculoskeletal: There are no aggressive appearing lytic or blastic lesions noted in the visualized portions of the skeleton. IMPRESSION: 1. There continues to be evidence of congestive heart Brady, as above. However, today's study also demonstrates new airspace consolidation in the left lower lobe concerning for  pneumonia. 2. Small right and moderate left pleural effusions. Left pleural effusion has increased substantially in size and is predominantly sub pulmonic in location, potentially loculated. 3. Aortic atherosclerosis, in addition to left main and 3 vessel coronary artery disease. Please note that although the presence of coronary artery calcium documents the presence of coronary artery disease, the severity of this disease and any potential stenosis cannot be assessed on this non-gated CT examination. Assessment for potential risk factor modification, dietary therapy or pharmacologic therapy may be warranted, if clinically indicated. 4. There are calcifications of the aortic valve and mitral annulus. Echocardiographic correlation for evaluation of potential valvular dysfunction may be warranted if clinically indicated. Aortic Atherosclerosis (ICD10-I70.0). Electronically Signed   By: Vinnie Langton M.D.   On: 05/18/2021 05:33   DG Chest Port 1 View  Result Date: 05/18/2021 CLINICAL DATA:  Shortness of breath. EXAM: PORTABLE CHEST 1 VIEW COMPARISON:  Chest x-ray 05/14/2021, CT chest 05/13/2021 FINDINGS: Redemonstration of vascular stents overlying the mediastinum as well as left axilla. Enlarged cardiac silhouette. The heart size and mediastinal contours are grossly unchanged. Aortic calcification. No focal consolidation. No pulmonary edema. Interval increase in size of a moderate volume left pleural effusion. At least trace right pleural effusion. Patchy interstitial and airspace opacities. No pneumothorax. No acute osseous abnormality. IMPRESSION: 1. Interval increase in size of a moderate volume left pleural effusion. 2. At least trace right pleural effusion. 3. Patchy interstitial and airspace opacities. Findings could represent infection versus inflammation or pulmonary edema. Electronically Signed   By: Iven Finn M.D.   On: 05/18/2021 02:19    EKG: Independently reviewed.  Normal sinus rhythm  atrial premature complexes.  Assessment/Plan Principal Problem:   Acute respiratory Brady with hypoxia (HCC) Active Problems:   [name]   [name] due to chronic kidney disease   Crohn disease (HCC)   Hidradenitis suppurativa   Type 2 diabetes mellitus with kidney complication, with long-term current use of insulin (HCC)   End stage renal Brady on dialysis [facility])   History of CVA (cerebrovascular accident)   [name] ([place])  S/P bilateral BKA (below knee amputation) (HCC)   Chronic [name] CHF (congestive heart Brady) ([place])   Long term current use of immunosuppressive drug    Acute respiratory Brady with hypoxia presently on 4 L at presentation [name] was on 10 L - likely combination of fluid overload with worsening left pleural effusion and possible consolidation.  I have ordered a CT chest without contrast to further study about the left pleural effusion.  May need thoracentesis.  [name] is on empiric antibiotics for pneumonia.  Consult nephrology for dialysis. Worsening [name] -transfused PRBC during dialysis.  Check [name] panel.  Follow CBC. [name] on lisinopril spironolactone Lasix amlodipine Coreg hydralazine. Diabetes mellitus type 2 presently not on any medication.  Will check CBGs closely. History of seizures on Keppra. History of hidradenitis suppurativa on Brady. Chronic [name] fluid management per nephrology during dialysis.  [name] is on lisinopril.   DVT prophylaxis: Heparin. Code Status: Full code. Family Communication: Discussed with [name] wife. Disposition Plan: Home. Consults called: None. Admission status: Observation.   Rise Patience MD [facility] Pager [phone].  If 7PM-7AM, please contact night-coverage [email or web address] Password [facility]  05/18/2021, 5:40 AM

## 2021-05-18 NOTE — Consult Note (Signed)
CRITICAL CARE PROGRESS NOTE    Name: [name] MRN: [id number] DOB: 02-25-1961     LOS: 0   SUBJECTIVE FINDINGS & SIGNIFICANT EVENTS    Patient description:  77 M with PMH of ESRD on MWF, htn, seizures, HFrEF, hidradenitis, came in due to acute on chronic hypoxemic respiratory failure.   In ER patient required HFNC at 10L. He had pleural effusion found on chest imaging and is s/p thoracentesis. PCCM consultation for acute respiratory distress with hypoxemia.   Lines/tubes :   Microbiology/Sepsis markers: Results for orders placed or performed during the hospital encounter of 05/18/21  Resp Panel by RT-PCR (Flu A&B, Covid) Nasopharyngeal Swab     Status: None   Collection Time: 05/18/21  1:43 AM   Specimen: Nasopharyngeal Swab; Nasopharyngeal(NP) swabs in vial transport medium  Result Value Ref Range Status   SARS Coronavirus 2 by RT PCR NEGATIVE NEGATIVE Final    Comment: (NOTE) SARS-CoV-2 target nucleic acids are NOT DETECTED.  The SARS-CoV-2 RNA is generally detectable in upper respiratory specimens during the acute phase of infection. The lowest concentration of SARS-CoV-2 viral copies this assay can detect is 138 copies/mL. A negative result does not preclude SARS-Cov-2 infection and should not be used as the sole basis for treatment or other patient management decisions. A negative result may occur with  improper specimen collection/handling, submission of specimen other than nasopharyngeal swab, presence of viral mutation(s) within the areas targeted by this assay, and inadequate number of viral copies(<138 copies/mL). A negative result must be combined with clinical observations, patient history, and epidemiological information. The expected result is Negative.  Fact Sheet for Patients:   EntrepreneurPulse.com.au  Fact Sheet for Healthcare Providers:  IncredibleEmployment.be  This test is no t yet approved or cleared by the [place] FDA and  has been authorized for detection and/or diagnosis of SARS-CoV-2 by FDA under an Emergency Use Authorization (EUA). This EUA will remain  in effect (meaning this test can be used) for the duration of the COVID-19 declaration under Section 564(b)(1) of the Act, 21 U.S.C.section 360bbb-3(b)(1), unless the authorization is terminated  or revoked sooner.       Influenza A by PCR NEGATIVE NEGATIVE Final   Influenza B by PCR NEGATIVE NEGATIVE Final    Comment: (NOTE) The Xpert Xpress SARS-CoV-2/FLU/RSV plus assay is intended as an aid in the diagnosis of influenza from Nasopharyngeal swab specimens and should not be used as a sole basis for treatment. Nasal washings and aspirates are unacceptable for Xpert Xpress SARS-CoV-2/FLU/RSV testing.  Fact Sheet for Patients: EntrepreneurPulse.com.au  Fact Sheet for Healthcare Providers: IncredibleEmployment.be  This test is not yet approved or cleared by the [place] FDA and has been authorized for detection and/or diagnosis of SARS-CoV-2 by FDA under an Emergency Use Authorization (EUA). This EUA will remain in effect (meaning this test can be used) for the duration of the COVID-19 declaration under Section 564(b)(1) of the Act, 21 U.S.C. section 360bbb-3(b)(1), unless the authorization is terminated or revoked.  Performed at [facility], [address] [place], [place] [address]   Blood culture (routine x 2)     Status: None (Preliminary result)   Collection Time: 05/18/21  1:44 AM   Specimen: BLOOD  Result Value Ref Range Status   Specimen Description BLOOD RIGHT ANTECUBITAL  Final   Special Requests   Final    BOTTLES DRAWN AEROBIC AND ANAEROBIC Blood Culture adequate volume   Culture    Final  NO GROWTH < 12 HOURS Performed at [facility], [place], [place] [address]    Report Status PENDING  Incomplete  Blood culture (routine x 2)     Status: None (Preliminary result)   Collection Time: 05/18/21  1:44 AM   Specimen: BLOOD  Result Value Ref Range Status   Specimen Description BLOOD BLOOD RIGHT FOREARM  Final   Special Requests   Final    BOTTLES DRAWN AEROBIC AND ANAEROBIC Blood Culture adequate volume   Culture   Final    NO GROWTH < 12 HOURS Performed at [facility], [address] [place], [place] [address]    Report Status PENDING  Incomplete    Anti-infectives:  Anti-infectives (From admission, onward)    Start     Dose/Rate Route Frequency Ordered Stop   05/20/21 0600  levofloxacin (LEVAQUIN) IVPB 500 mg  Status:  Discontinued       See Hyperspace for full Linked Orders Report.   500 mg 100 mL/hr over 60 Minutes Intravenous Every 48 hours 05/18/21 0558 05/18/21 0601   05/20/21 0600  cefTRIAXone (ROCEPHIN) 1 g in sodium chloride 0.9 % 100 mL IVPB        1 g 200 mL/hr over 30 Minutes Intravenous Every 24 hours 05/18/21 1224     05/20/21 0600  azithromycin (ZITHROMAX) 500 mg in sodium chloride 0.9 % 250 mL IVPB        500 mg 250 mL/hr over 60 Minutes Intravenous Every 24 hours 05/18/21 1224     05/20/21 0400  levofloxacin (LEVAQUIN) IVPB 500 mg  Status:  Discontinued        500 mg 100 mL/hr over 60 Minutes Intravenous Every 48 hours 05/18/21 0602 05/18/21 1224   05/18/21 0600  levofloxacin (LEVAQUIN) IVPB 750 mg  Status:  Discontinued       See Hyperspace for full Linked Orders Report.   750 mg 100 mL/hr over 90 Minutes Intravenous  Once 05/18/21 0558 05/18/21 0601   05/18/21 0345  levofloxacin (LEVAQUIN) IVPB 750 mg        750 mg 100 mL/hr over 90 Minutes Intravenous  Once 05/18/21 0335 05/18/21 0526        Consults: Treatment Team:  Ottie Glazier, MD     PAST MEDICAL HISTORY   Past Medical  History:  Diagnosis Date   Acute metabolic encephalopathy 11/21/4008   Anemia    Crohn disease ([place])    Diabetes mellitus without complication ([place])    DVT of lower extremity (deep venous thrombosis) ([place]) 2016   Empyema ([place]) 05/20/2017   Encephalopathy 12/04/2017   Fall at home, initial encounter 09/12/2020   Hidradenitis suppurativa    Hypertension    ICH (intracerebral hemorrhage) ([place])    Peritonitis ([place]) 04/21/2017   Pyogenic arthritis of knee ([place]) 02/04/2016   Renal disorder    Sepsis ([place]) 01/12/2018   Stroke [facility])      SURGICAL HISTORY   Past Surgical History:  Procedure Laterality Date   A/V FISTULAGRAM Left 02/24/2021   Procedure: A/V FISTULAGRAM;  Surgeon: Katha Cabal, MD;  Location: [place] [facility];  Service: Cardiovascular;  Laterality: Left;   ABDOMINAL SURGERY     AMPUTATION FINGER Left 06/2019   PR AMPUTATION LONG FINGER/THUMB+FLAPS [facility]   ANGIOPLASTY Left    left fem-pop at [facility] 04-11-2018   BELOW KNEE LEG AMPUTATION Right 08/2017   [facility]   COLONOSCOPY     COLONOSCOPY WITH PROPOFOL N/A 10/28/2020  Procedure: COLONOSCOPY WITH PROPOFOL;  Surgeon: Lin Landsman, MD;  Location: [facility] ENDOSCOPY;  Service: Gastroenterology;  Laterality: N/A;   COLONOSCOPY WITH PROPOFOL N/A 11/21/2020   Procedure: COLONOSCOPY WITH PROPOFOL;  Surgeon: [name], MD;  Location: [facility]OSCOPY;  Service: Endoscopy;  Laterality: N/A;   DIALYSIS/PERMA CATHETER INSERTION N/A 12/09/2017   Procedure: DIALYSIS/PERMA CATHETER INSERTION;  Surgeon: Katha Cabal, MD;  Location: [place] [facility];  Service: Cardiovascular;  Laterality: N/A;   DIALYSIS/PERMA CATHETER INSERTION N/A 12/12/2017   Procedure: DIALYSIS/PERMA CATHETER INSERTION;  Surgeon: Algernon Huxley, MD;  Location: [place] [facility];  Service: Cardiovascular;  Laterality: N/A;   DIALYSIS/PERMA CATHETER REMOVAL Left 12/09/2017   Procedure: DIALYSIS/PERMA CATHETER REMOVAL;  Surgeon: Katha Cabal, MD;  Location:  [place] [facility];  Service: Cardiovascular;  Laterality: Left;   ESOPHAGOGASTRODUODENOSCOPY (EGD) WITH PROPOFOL N/A 11/20/2020   Procedure: ESOPHAGOGASTRODUODENOSCOPY (EGD) WITH PROPOFOL;  Surgeon: [name], MD;  Location: [facility] ENDOSCOPY;  Service: Endoscopy;  Laterality: N/A;   KNEE SURGERY Left 02/04/2016   [facility]   LEG AMPUTATION THROUGH LOWER TIBIA AND FIBULA Left 06/22/2018   [facility]   LOWER EXTREMITY ANGIOGRAPHY Right 08/08/2017   Procedure: Lower Extremity Angiography;  Surgeon: Algernon Huxley, MD;  Location: [place] [facility];  Service: Cardiovascular;  Laterality: Right;   LOWER EXTREMITY ANGIOGRAPHY Right 08/22/2017   Procedure: Lower Extremity Angiography;  Surgeon: Algernon Huxley, MD;  Location: [place] [facility];  Service: Cardiovascular;  Laterality: Right;   LOWER EXTREMITY INTERVENTION  08/08/2017   Procedure: LOWER EXTREMITY INTERVENTION;  Surgeon: Algernon Huxley, MD;  Location: [place] [facility];  Service: Cardiovascular;;   LOWER EXTREMITY INTERVENTION  08/22/2017   Procedure: LOWER EXTREMITY INTERVENTION;  Surgeon: Algernon Huxley, MD;  Location: [place] [facility];  Service: Cardiovascular;;     FAMILY HISTORY   Family History  Problem Relation Age of Onset   Irritable bowel syndrome Sister    Diabetes Sister    Heart disease Mother    Diabetes Mother    Heart disease Father    Rheumatic fever Father        as child   Psoriasis Brother    Arthritis Brother    Diabetes Sister    Diabetes Sister      SOCIAL HISTORY   Social History   Tobacco Use   Smoking status: Former   Smokeless tobacco: Never   Tobacco comments:    smokes marijuana  Vaping Use   Vaping Use: Never used  Substance Use Topics   Alcohol use: No   Drug use: Yes    Types: Marijuana     MEDICATIONS   Current Medication:  Current Facility-Administered Medications:    acetaminophen (TYLENOL) tablet 650 mg, 650 mg, Oral, Q6H PRN **OR** acetaminophen (TYLENOL) suppository 650 mg,  650 mg, Rectal, Q6H PRN, Rise Patience, MD   amLODipine (NORVASC) tablet 10 mg, 10 mg, Oral, Daily, Rise Patience, MD   arformoterol (BROVANA) nebulizer solution 15 mcg, 15 mcg, Nebulization, BID, Sreenath, Sudheer B, MD   aspirin chewable tablet 81 mg, 81 mg, Oral, Daily, Rise Patience, MD   atorvastatin (LIPITOR) tablet 80 mg, 80 mg, Oral, Daily, Rise Patience, MD   [START ON 05/20/2021] azithromycin (ZITHROMAX) 500 mg in sodium chloride 0.9 % 250 mL IVPB, 500 mg, Intravenous, Q24H, Sreenath, Sudheer B, MD   budesonide (PULMICORT) nebulizer solution 0.25 mg, 0.25 mg, Nebulization, BID, Sreenath, Sudheer B, MD   carvedilol (  COREG) tablet 25 mg, 25 mg, Oral, BID, Rise Patience, MD   [START ON 05/20/2021] cefTRIAXone (ROCEPHIN) 1 g in sodium chloride 0.9 % 100 mL IVPB, 1 g, Intravenous, Q24H, Sreenath, Sudheer B, MD   chlorhexidine (PERIDEX) 0.12 % solution 15 mL, 15 mL, Mouth Rinse, BID, Lanney [name], Mihika Surrette, MD   Chlorhexidine Gluconate Cloth 2 % PADS 6 each, 6 each, Topical, Q0600, Colon Flattery, NP, 6 each at 05/18/21 1522   [START ON 05/19/2021] Chlorhexidine Gluconate Cloth 2 % PADS 6 each, 6 each, Topical, Q0600, Ottie Glazier, MD, 6 each at 05/18/21 1513   cloNIDine (CATAPRES) tablet 0.2 mg, 0.2 mg, Oral, BID, Rise Patience, MD   feeding supplement (NEPRO CARB STEADY) liquid 237 mL, 237 mL, Oral, TID BM, Sreenath, Sudheer B, MD   furosemide (LASIX) tablet 80 mg, 80 mg, Oral, BID, Rise Patience, MD   gabapentin (NEURONTIN) capsule 400 mg, 400 mg, Oral, QHS, Rise Patience, MD   heparin injection 5,000 Units, 5,000 Units, Subcutaneous, Q8H, Rise Patience, MD   hydrALAZINE (APRESOLINE) tablet 100 mg, 100 mg, Oral, Q8H, Rise Patience, MD   insulin aspart (novoLOG) injection 0-6 Units, 0-6 Units, Subcutaneous, TID WC, Sreenath, Sudheer B, MD, 1 Units at 05/18/21 1626   ipratropium-albuterol (DUONEB) 0.5-2.5 (3) MG/3ML nebulizer  solution 3 mL, 3 mL, Nebulization, Q4H, Sreenath, Sudheer B, MD, 3 mL at 05/18/21 1615   levETIRAcetam (KEPPRA) tablet 500 mg, 500 mg, Oral, Daily, Rise Patience, MD   lisinopril (ZESTRIL) tablet 40 mg, 40 mg, Oral, Daily, Rise Patience, MD   MEDLINE mouth rinse, 15 mL, Mouth Rinse, q12n4p, Ottie Glazier, MD   multivitamin (RENA-VIT) tablet 1 tablet, 1 tablet, Oral, QHS, Rise Patience, MD   sevelamer carbonate (RENVELA) tablet 800 mg, 800 mg, Oral, TID WC, Rise Patience, MD   spironolactone (ALDACTONE) tablet 25 mg, 25 mg, Oral, Daily, Rise Patience, MD    ALLERGIES   Methotrexate, Vancomycin, Cefepime, and Tape    REVIEW OF SYSTEMS       PHYSICAL EXAMINATION   Vital Signs: Temp:  [97.7 F (36.5 C)-99.4 F (37.4 C)] 99.4 F (37.4 C) (08/01 1507) Pulse Rate:  [64-92] 86 (08/01 1639) Resp:  [15-22] 18 (08/01 1639) BP: (143-168)/(62-89) 168/76 (08/01 1507) SpO2:  [84 %-100 %] 94 % (08/01 1639) FiO2 (%):  [40 %-100 %] 60 % (08/01 1639) Weight:  [56.7 kg-58.9 kg] 58.9 kg (08/01 1507)  GENERAL:older then stated age , mild distress HEAD: Normocephalic, atraumatic.  EYES: Pupils equal, round, reactive to light.  No scleral icterus.  MOUTH: Moist mucosal membrane. NECK: Supple. No thyromegaly. No nodules. No JVD.  PULMONARY: rhonchi bilaterally  CARDIOVASCULAR: S1 and S2. Regular rate and rhythm. No murmurs, rubs, or gallops.  GASTROINTESTINAL: Soft, nontender, non-distended. No masses. Positive bowel sounds. No hepatosplenomegaly.  MUSCULOSKELETAL: No swelling, clubbing, or edema. Bilaterl BKA amputation NEUROLOGIC: Mild distress due to acute illness SKIN:intact,warm,dry   PERTINENT DATA     Infusions:  [START ON 05/20/2021] azithromycin     [START ON 05/20/2021] cefTRIAXone (ROCEPHIN)  IV     Scheduled Medications:  amLODipine  10 mg Oral Daily   arformoterol  15 mcg Nebulization BID   aspirin  81 mg Oral Daily   atorvastatin  80  mg Oral Daily   budesonide (PULMICORT) nebulizer solution  0.25 mg Nebulization BID   carvedilol  25 mg Oral BID   chlorhexidine  15 mL Mouth Rinse BID   Chlorhexidine Gluconate  Cloth  6 each Topical Q0600   [START ON 05/19/2021] Chlorhexidine Gluconate Cloth  6 each Topical Q0600   cloNIDine  0.2 mg Oral BID   feeding supplement (NEPRO CARB STEADY)  237 mL Oral TID BM   furosemide  80 mg Oral BID   gabapentin  400 mg Oral QHS   heparin  5,000 Units Subcutaneous Q8H   hydrALAZINE  100 mg Oral Q8H   insulin aspart  0-6 Units Subcutaneous TID WC   ipratropium-albuterol  3 mL Nebulization Q4H   levETIRAcetam  500 mg Oral Daily   lisinopril  40 mg Oral Daily   mouth rinse  15 mL Mouth Rinse q12n4p   multivitamin  1 tablet Oral QHS   sevelamer carbonate  800 mg Oral TID WC   spironolactone  25 mg Oral Daily   PRN Medications: acetaminophen **OR** acetaminophen Hemodynamic parameters:   Intake/Output: No intake/output data recorded.  Ventilator  Settings: FiO2 (%):  [40 %-100 %] 60 %     LAB RESULTS:  Basic Metabolic Panel: Recent Labs  Lab 05/12/21 1928 05/15/21 0649 05/18/21 0143 05/18/21 0547  NA 136 136 134*  --   K 3.3* 4.0 4.6  --   CL 95* 95* 95*  --   CO2 30 28 27   --   GLUCOSE 170* 204* 183*  --   BUN 37* 40* 49*  --   CREATININE 5.49* 5.29* 6.30* 6.48*  CALCIUM 8.2* 8.8* 8.7*  --    Liver Function Tests: No results for input(s): AST, ALT, ALKPHOS, BILITOT, PROT, ALBUMIN in the last 168 hours. No results for input(s): LIPASE, AMYLASE in the last 168 hours. No results for input(s): AMMONIA in the last 168 hours. CBC: Recent Labs  Lab 05/12/21 1928 05/18/21 0143 05/18/21 0547  WBC 6.2 6.1 6.9  NEUTROABS  --  4.5  --   HGB 8.2* 6.8* 7.8*  HCT 26.8* 22.8* 25.9*  MCV 83.0 84.8 83.5  PLT 154 97* 123*   Cardiac Enzymes: No results for input(s): CKTOTAL, CKMB, CKMBINDEX, TROPONINI in the last 168 hours. BNP: Invalid input(s): POCBNP CBG: Recent Labs   Lab 05/15/21 0816 05/18/21 0657 05/18/21 0752 05/18/21 1319 05/18/21 1644  GLUCAP 196* 184* 189* 181* 133*       IMAGING RESULTS:  Imaging: CT CHEST WO CONTRAST  Result Date: 05/18/2021 CLINICAL DATA:  60 year old male with history of chest pain or shortness of breath. EXAM: CT CHEST WITHOUT CONTRAST TECHNIQUE: Multidetector CT imaging of the chest was performed following the standard protocol without IV contrast. COMPARISON:  Chest CT 05/13/2021. FINDINGS: Cardiovascular: Heart size is enlarged. There is no significant pericardial fluid, thickening or pericardial calcification. There is aortic atherosclerosis, as well as atherosclerosis of the great vessels of the mediastinum and the coronary arteries, including calcified atherosclerotic plaque in the left main, left anterior descending, left circumflex and right coronary arteries. Calcifications of the aortic valve and mitral annulus. Vascular stent in the left innominate vein. Mediastinum/Nodes: Assessment for lymphadenopathy is exceedingly limited on today's noncontrast CT examination, particularly in light of the patient's thin body habitus. Thickening of the esophagus most notably in the mid esophagus (axial image 41 of series 2), poorly evaluated on today's noncontrast CT examination. No axillary lymphadenopathy. Lungs/Pleura: Right-sided pleural calcification. No left pleural calcification identified. Small right and moderate left pleural effusions. The majority of the left pleural effusion is sub pulmonic in distribution. There continues to be generalized mild ground-glass attenuation and interlobular septal thickening in the lungs bilaterally  suggesting a background of mild interstitial pulmonary edema. Compared to the prior study, the left lower lobe is now opacified with only a small amount of volume loss, indicating predominantly airspace consolidation rather than simple atelectasis. Upper Abdomen: Aortic atherosclerosis.  Musculoskeletal: There are no aggressive appearing lytic or blastic lesions noted in the visualized portions of the skeleton. IMPRESSION: 1. There continues to be evidence of congestive heart failure, as above. However, today's study also demonstrates new airspace consolidation in the left lower lobe concerning for pneumonia. 2. Small right and moderate left pleural effusions. Left pleural effusion has increased substantially in size and is predominantly sub pulmonic in location, potentially loculated. 3. Aortic atherosclerosis, in addition to left main and 3 vessel coronary artery disease. Please note that although the presence of coronary artery calcium documents the presence of coronary artery disease, the severity of this disease and any potential stenosis cannot be assessed on this non-gated CT examination. Assessment for potential risk factor modification, dietary therapy or pharmacologic therapy may be warranted, if clinically indicated. 4. There are calcifications of the aortic valve and mitral annulus. Echocardiographic correlation for evaluation of potential valvular dysfunction may be warranted if clinically indicated. Aortic Atherosclerosis (ICD10-I70.0). Electronically Signed   By: Vinnie Langton M.D.   On: 05/18/2021 05:33   DG Chest Port 1 View  Result Date: 05/18/2021 CLINICAL DATA:  Respiratory distress. Recent left-sided thoracentesis. EXAM: PORTABLE CHEST 1 VIEW COMPARISON:  05/18/2021 FINDINGS: Portable semi upright view of the chest was obtained. Grossly stable cardiomediastinal contours. Aortic atherosclerosis. Moderate-sized right-sided pleural effusion with extensive airspace opacities within the right lung. Diffuse interstitial opacities throughout the left lung with slight improved aeration of the left lung base. No pneumothorax. Vascular stents again seen in the left axillary and brachiocephalic regions. IMPRESSION: 1. Slight improved aeration of the left lung base post thoracentesis.  No pneumothorax. 2. Otherwise stable chest with multifocal opacities and moderate right-sided pleural effusion. Electronically Signed   By: Davina Poke D.O.   On: 05/18/2021 15:37   DG Chest Port 1 View  Result Date: 05/18/2021 CLINICAL DATA:  Status post left thoracentesis EXAM: PORTABLE CHEST 1 VIEW COMPARISON:  05/18/2021, 1:54 a.m. FINDINGS: Interval reduction in volume of a previously seen large left pleural effusion, now small and layering, with improved aeration of the left lung. Interval increase in volume of right-sided pleural effusion with increased consolidation of the right lung. Cardiomegaly. Left axillary and brachiocephalic stents. IMPRESSION: 1. Interval reduction in volume of a previously seen large left pleural effusion, now small and layering, with improved aeration of the left lung. No pneumothorax. 2. Interval increase in volume of right-sided pleural effusion with increased consolidation of the right lung. 3. Cardiomegaly. Electronically Signed   By: Eddie Candle M.D.   On: 05/18/2021 12:51   DG Chest Port 1 View  Result Date: 05/18/2021 CLINICAL DATA:  Shortness of breath. EXAM: PORTABLE CHEST 1 VIEW COMPARISON:  Chest x-ray 05/14/2021, CT chest 05/13/2021 FINDINGS: Redemonstration of vascular stents overlying the mediastinum as well as left axilla. Enlarged cardiac silhouette. The heart size and mediastinal contours are grossly unchanged. Aortic calcification. No focal consolidation. No pulmonary edema. Interval increase in size of a moderate volume left pleural effusion. At least trace right pleural effusion. Patchy interstitial and airspace opacities. No pneumothorax. No acute osseous abnormality. IMPRESSION: 1. Interval increase in size of a moderate volume left pleural effusion. 2. At least trace right pleural effusion. 3. Patchy interstitial and airspace opacities. Findings could represent infection versus inflammation  or pulmonary edema. Electronically Signed   By: Iven Finn M.D.   On: 05/18/2021 02:19   US THORACENTESIS ASP PLEURAL SPACE W/IMG GUIDE  Result Date: 05/18/2021 INDICATION: Shortness of breath. Left-sided pleural effusion. Request for diagnostic and therapeutic thoracentesis. EXAM: ULTRASOUND GUIDED LEFT THORACENTESIS MEDICATIONS: 1% plain lidocaine, 5 mL COMPLICATIONS: None immediate. PROCEDURE: An ultrasound guided thoracentesis was thoroughly discussed with the patient and questions answered. The benefits, risks, alternatives and complications were also discussed. The patient understands and wishes to proceed with the procedure. Written consent was obtained. Ultrasound was performed to localize and mark an adequate pocket of fluid in the left chest. The area was then prepped and draped in the normal sterile fashion. 1% Lidocaine was used for local anesthesia. Under ultrasound guidance a 6 Fr Safe-T-Centesis catheter was introduced. Thoracentesis was performed. The catheter was removed and a dressing applied. FINDINGS: A total of approximately 1 L of clear, dark yellow fluid was removed. Samples were sent to the laboratory as requested by the clinical team. IMPRESSION: Successful ultrasound guided left thoracentesis yielding 1 L of pleural fluid. Read by: Ascencion Dike PA-C Electronically Signed   By: Jacqulynn Cadet M.D.   On: 05/18/2021 12:57   @[email or web address] @ CT CHEST WO CONTRAST  Result Date: 05/18/2021 CLINICAL DATA:  60 year old male with history of chest pain or shortness of breath. EXAM: CT CHEST WITHOUT CONTRAST TECHNIQUE: Multidetector CT imaging of the chest was performed following the standard protocol without IV contrast. COMPARISON:  Chest CT 05/13/2021. FINDINGS: Cardiovascular: Heart size is enlarged. There is no significant pericardial fluid, thickening or pericardial calcification. There is aortic atherosclerosis, as well as atherosclerosis of the great vessels of the mediastinum and the coronary arteries, including calcified atherosclerotic  plaque in the left main, left anterior descending, left circumflex and right coronary arteries. Calcifications of the aortic valve and mitral annulus. Vascular stent in the left innominate vein. Mediastinum/Nodes: Assessment for lymphadenopathy is exceedingly limited on today's noncontrast CT examination, particularly in light of the patient's thin body habitus. Thickening of the esophagus most notably in the mid esophagus (axial image 41 of series 2), poorly evaluated on today's noncontrast CT examination. No axillary lymphadenopathy. Lungs/Pleura: Right-sided pleural calcification. No left pleural calcification identified. Small right and moderate left pleural effusions. The majority of the left pleural effusion is sub pulmonic in distribution. There continues to be generalized mild ground-glass attenuation and interlobular septal thickening in the lungs bilaterally suggesting a background of mild interstitial pulmonary edema. Compared to the prior study, the left lower lobe is now opacified with only a small amount of volume loss, indicating predominantly airspace consolidation rather than simple atelectasis. Upper Abdomen: Aortic atherosclerosis. Musculoskeletal: There are no aggressive appearing lytic or blastic lesions noted in the visualized portions of the skeleton. IMPRESSION: 1. There continues to be evidence of congestive heart failure, as above. However, today's study also demonstrates new airspace consolidation in the left lower lobe concerning for pneumonia. 2. Small right and moderate left pleural effusions. Left pleural effusion has increased substantially in size and is predominantly sub pulmonic in location, potentially loculated. 3. Aortic atherosclerosis, in addition to left main and 3 vessel coronary artery disease. Please note that although the presence of coronary artery calcium documents the presence of coronary artery disease, the severity of this disease and any potential stenosis cannot be  assessed on this non-gated CT examination. Assessment for potential risk factor modification, dietary therapy or pharmacologic therapy may be warranted, if clinically indicated. 4. There are calcifications  of the aortic valve and mitral annulus. Echocardiographic correlation for evaluation of potential valvular dysfunction may be warranted if clinically indicated. Aortic Atherosclerosis (ICD10-I70.0). Electronically Signed   By: Vinnie Langton M.D.   On: 05/18/2021 05:33   DG Chest Port 1 View  Result Date: 05/18/2021 CLINICAL DATA:  Respiratory distress. Recent left-sided thoracentesis. EXAM: PORTABLE CHEST 1 VIEW COMPARISON:  05/18/2021 FINDINGS: Portable semi upright view of the chest was obtained. Grossly stable cardiomediastinal contours. Aortic atherosclerosis. Moderate-sized right-sided pleural effusion with extensive airspace opacities within the right lung. Diffuse interstitial opacities throughout the left lung with slight improved aeration of the left lung base. No pneumothorax. Vascular stents again seen in the left axillary and brachiocephalic regions. IMPRESSION: 1. Slight improved aeration of the left lung base post thoracentesis. No pneumothorax. 2. Otherwise stable chest with multifocal opacities and moderate right-sided pleural effusion. Electronically Signed   By: Davina Poke D.O.   On: 05/18/2021 15:37   DG Chest Port 1 View  Result Date: 05/18/2021 CLINICAL DATA:  Status post left thoracentesis EXAM: PORTABLE CHEST 1 VIEW COMPARISON:  05/18/2021, 1:54 a.m. FINDINGS: Interval reduction in volume of a previously seen large left pleural effusion, now small and layering, with improved aeration of the left lung. Interval increase in volume of right-sided pleural effusion with increased consolidation of the right lung. Cardiomegaly. Left axillary and brachiocephalic stents. IMPRESSION: 1. Interval reduction in volume of a previously seen large left pleural effusion, now small and  layering, with improved aeration of the left lung. No pneumothorax. 2. Interval increase in volume of right-sided pleural effusion with increased consolidation of the right lung. 3. Cardiomegaly. Electronically Signed   By: Eddie Candle M.D.   On: 05/18/2021 12:51   DG Chest Port 1 View  Result Date: 05/18/2021 CLINICAL DATA:  Shortness of breath. EXAM: PORTABLE CHEST 1 VIEW COMPARISON:  Chest x-ray 05/14/2021, CT chest 05/13/2021 FINDINGS: Redemonstration of vascular stents overlying the mediastinum as well as left axilla. Enlarged cardiac silhouette. The heart size and mediastinal contours are grossly unchanged. Aortic calcification. No focal consolidation. No pulmonary edema. Interval increase in size of a moderate volume left pleural effusion. At least trace right pleural effusion. Patchy interstitial and airspace opacities. No pneumothorax. No acute osseous abnormality. IMPRESSION: 1. Interval increase in size of a moderate volume left pleural effusion. 2. At least trace right pleural effusion. 3. Patchy interstitial and airspace opacities. Findings could represent infection versus inflammation or pulmonary edema. Electronically Signed   By: Iven Finn M.D.   On: 05/18/2021 02:19   US THORACENTESIS ASP PLEURAL SPACE W/IMG GUIDE  Result Date: 05/18/2021 INDICATION: Shortness of breath. Left-sided pleural effusion. Request for diagnostic and therapeutic thoracentesis. EXAM: ULTRASOUND GUIDED LEFT THORACENTESIS MEDICATIONS: 1% plain lidocaine, 5 mL COMPLICATIONS: None immediate. PROCEDURE: An ultrasound guided thoracentesis was thoroughly discussed with the patient and questions answered. The benefits, risks, alternatives and complications were also discussed. The patient understands and wishes to proceed with the procedure. Written consent was obtained. Ultrasound was performed to localize and mark an adequate pocket of fluid in the left chest. The area was then prepped and draped in the normal sterile  fashion. 1% Lidocaine was used for local anesthesia. Under ultrasound guidance a 6 Fr Safe-T-Centesis catheter was introduced. Thoracentesis was performed. The catheter was removed and a dressing applied. FINDINGS: A total of approximately 1 L of clear, dark yellow fluid was removed. Samples were sent to the laboratory as requested by the clinical team. IMPRESSION: Successful ultrasound guided left  thoracentesis yielding 1 L of pleural fluid. Read by: Ascencion Dike PA-C Electronically Signed   By: Jacqulynn Cadet M.D.   On: 05/18/2021 12:57         ASSESSMENT AND PLAN    -Multidisciplinary rounds held today  Acute on chronic Hypoxic Respiratory Failure - present on admission  -note patient is immunocompromised on biologic injection with [name] for HS - COVID19 negative   - supplemental O2 during my evaluation [place] - will perform infectious workup for pneumonia -Respiratory viral panel -serum fungitell -legionella ab -strep pneumoniae ur AG -Histoplasma Ur Ag -sputum resp cultures -AFB sputum expectorated specimen -sputum cytology  -reviewed pertinent imaging with patient today - ESR -on CAP regimen with rocephin /zithromax  Acute on chronic decompensated systolic CHF exacerbation with EF <35%  - agree with lasix 80 bid - appreciate renal and TRH team   -BNP>4500 -oxygen as needed -Lasix as tolerated -follow up cardiac enzymes as indicated ICU monitoring   Left pleural effusion  Monocyte and lymphycyte predominant transudate consistent with CHF/CKD related fluid -empirically treated for CAP with rocephin and zithromax  Renal Failure-ESRD -follow chem 7 -appreciate renal team -follow UO -continue condom Catheter-assess need daily   ID -continue IV abx as prescibed -follow up cultures  GI/Nutrition GI PROPHYLAXIS as indicated DIET-->TF's as tolerated Constipation protocol as indicated  ENDO - ICU hypoglycemic\Hyperglycemia protocol -check FSBS per  protocol   ELECTROLYTES -follow labs as needed -replace as needed -pharmacy consultation   DVT/GI PRX ordered -SCDs  TRANSFUSIONS AS NEEDED MONITOR FSBS ASSESS the need for LABS as needed   Critical care provider statement:    Critical care time (minutes):  33   Critical care time was exclusive of:  Separately billable procedures and treating other patients   Critical care was necessary to treat or prevent imminent or life-threatening deterioration of the following conditions:     Critical care was time spent personally by me on the following activities:  Development of treatment plan with patient or surrogate, discussions with consultants, evaluation of patient's response to treatment, examination of patient, obtaining history from patient or surrogate, ordering and performing treatments and interventions, ordering and review of laboratory studies and re-evaluation of patient's condition.  I assumed direction of critical care for this patient from another provider in my specialty: no    This document was prepared using Dragon voice recognition software and may include unintentional dictation errors.    Ottie Glazier, M.D.  Division of [place]

## 2021-05-18 NOTE — ED Triage Notes (Signed)
Came from home felt short of breath; was stat at 82 on room air; patient is now 96 on 10L; patient last had dialysis last Friday.

## 2021-05-18 NOTE — TOC Initial Note (Signed)
Transition of Care [facility]) - Initial/Assessment Note    Patient Details  Name: [name] MRN: [id number] Date of Birth: June 11, 1961  Transition of Care [facility]) CM/SW Contact:    Shelbie Hutching, RN Phone Number: 05/18/2021, 2:53 PM  Clinical Narrative:                 Patient placed under observation for acute respiratory failure with hypoxia, discharged on 7/29 (admitted for the same).  Patient is end stage renal disease on MWF HD, also has CHF.  Patient is on acute O2 at 4L.   RNCM met with patient at the bedside, patient reports that he lives with his wife at home, he has a wheelchair and walker, he has prosthetics and can stand and walk with his walker.  Patient is followed by [facility].   Patient voices that he wants to get better and get back home.  He is open with [facility] for RN, PT, and OT.   TOC will cont to follow.   Expected Discharge Plan: [place] Barriers to Discharge: Continued Medical Work up   Patient Goals and CMS Choice Patient states their goals for this hospitalization and ongoing recovery are:: Patient wants to get better and get back home CMS Medicare.gov Compare Post Acute Care list provided to:: Patient Choice offered to / list presented to : Patient, Spouse  Expected Discharge Plan and Services Expected Discharge Plan: [place]   Discharge Planning Services: CM Consult Post Acute Care Choice: Home Health, Resumption of Svcs/PTA Provider Living arrangements for the past 2 months: Single Family Home                 DME Arranged: N/A DME Agency: NA       HH Arranged: RN, PT, OT, Nurse's Aide [place] Agency: [place] ([facility]) Date HH Agency Contacted: 05/18/21 Time HH Agency Contacted: 79 Representative spoke with at [place]: Corene Cornea  Prior Living Arrangements/Services Living arrangements for the past 2 months: [place] Lives with:: Spouse Patient language and need for  interpreter reviewed:: Yes Do you feel safe going back to the place where you live?: Yes      Need for Family Participation in Patient Care: Yes (Comment) (COPD end stage) Care giver support system in place?: Yes (comment) (wife) Current home services: DME, Home RN, Home PT, Home OT (oxygen, walker, wheelchair) Criminal Activity/Legal Involvement Pertinent to Current Situation/Hospitalization: No - Comment as needed  Activities of Daily Living Home Assistive Devices/Equipment: Shower chair without back, Environmental consultant (specify type), Wheelchair, Raised toilet seat with rails, Transfer board, Eyeglasses, CBG Meter ADL Screening (condition at time of admission) Patient's cognitive ability adequate to safely complete daily activities?: No Is the patient deaf or have difficulty hearing?: No Does the patient have difficulty seeing, even when wearing glasses/contacts?: No Does the patient have difficulty concentrating, remembering, or making decisions?: Yes Patient able to express need for assistance with ADLs?: Yes Does the patient have difficulty dressing or bathing?: Yes Independently performs ADLs?: No Communication: Independent Dressing (OT): Needs assistance Is this a change from baseline?: Pre-admission baseline Grooming: Needs assistance Is this a change from baseline?: Pre-admission baseline Feeding: Needs assistance Is this a change from baseline?: Pre-admission baseline Bathing: Needs assistance Is this a change from baseline?: Pre-admission baseline Toileting: Needs assistance Is this a change from baseline?: Pre-admission baseline In/Out Bed: Needs assistance Is this a change from baseline?: Pre-admission baseline Walks in Home: Independent with  device (comment) Is this a change from baseline?: Pre-admission baseline Does the patient have difficulty walking or climbing stairs?: Yes Weakness of Legs: Both Weakness of Arms/Hands: None  Permission Sought/Granted Permission sought to  share information with : Case Manager, Family Supports, Other (comment) Permission granted to share information with : Yes, Verbal Permission Granted  Share Information with NAME: [name]  Permission granted to share info w AGENCY: [facility]  Permission granted to share info w Relationship: wife  Permission granted to share info w Contact Information: [phone]  Emotional Assessment   Attitude/Demeanor/Rapport: Engaged Affect (typically observed): Accepting Orientation: : Oriented to Self, Oriented to Situation, Oriented to Place Alcohol / Substance Use: Not Applicable Psych Involvement: No (comment)  Admission diagnosis:  SOB (shortness of breath) [R06.02] Pleural effusion [J90] Healthcare-associated pneumonia [J18.9] Acute respiratory failure with hypoxia (HCC) [J96.01] Acute on chronic anemia [D64.9] Patient Active Problem List   Diagnosis Date Noted   Acute respiratory failure with hypoxia ([place]) 05/18/2021   Protein-calorie malnutrition, severe 05/14/2021   Acute pulmonary edema (HCC)    Shortness of breath    Fluid overload 05/12/2021   Cellulitis and abscess of buttock    History of GI bleed 02/06/2021   Long term current use of immunosuppressive drug 02/06/2021   Disorder of skin due to Crohn's disease ([place]) 70/26/3785   Complication of vascular access for dialysis 01/22/2021   Polyp of transverse colon    Acute metabolic encephalopathy 88/50/2774   Acute on chronic anemia 12/87/8676   Chronic systolic CHF (congestive heart failure) ([place]) 11/18/2020   Cerebrovascular disease 09/12/2020   Transient alteration of awareness 09/12/2020   Hyperkalemia 09/12/2020   Mild cognitive impairment 09/30/2019   Peripheral vascular disease ([place]) 06/28/2018   Sepsis ([place]) 01/12/2018   Pressure injury of skin 12/04/2017   S/P bilateral BKA (below knee amputation) ([place]) 10/20/2017   Atherosclerosis of native arteries of extremity with rest pain ([place]) 08/05/2017   History of CVA  (cerebrovascular accident) 04/15/2017   Seizure disorder ([place]) 04/15/2017   End stage renal failure on dialysis ([place]) 04/12/2016   Aphthae 02/20/2016   Hidradenitis suppurativa 02/20/2016   Leg pain 02/20/2016   Neuropathy 02/20/2016   Narrowing of intervertebral disc space 08/29/2015   Vascular disorder of lower extremity 08/29/2015   Failure of erection 08/29/2015   Hypercholesteremia 08/29/2015   Hypertension 08/29/2015   Anemia due to chronic kidney disease 05/26/2015   Venous insufficiency of leg 09/04/2014   History of deep vein thrombosis (DVT) of lower extremity 08/16/2014   Prostatic intraepithelial neoplasia 11/02/2013   Elevated prostate specific antigen (PSA) 09/11/2013   Benign prostatic hyperplasia with urinary obstruction 08/13/2013   Spermatocele 08/13/2013   Avitaminosis D 01/25/2013   Type 2 diabetes mellitus with kidney complication, with long-term current use of insulin ([place]) 03/28/2012   Crohn disease ([place]) 08/03/2011   PCP:  [name], MD Pharmacy:   [facility] [place], [place] AT [facility] OF [address] [place] [address] Phone: [phone] Fax: [phone]     Social Determinants of Health (SDOH) Interventions    Readmission Risk Interventions Readmission Risk Prevention Plan 02/10/2021 02/08/2021 11/21/2020  Transportation Screening Complete Complete Complete  PCP or Specialist Appt within 5-7 Days - Complete -  Home Care Screening - Complete -  Medication Review (RN CM) - Complete -  HRI or Home Care Consult Complete - -  Palliative Care Screening Complete - -  Medication Review Press photographer) Complete - -  Some recent data might be hidden

## 2021-05-18 NOTE — Progress Notes (Signed)
Inpatient Diabetes Program Recommendations  AACE/ADA: New Consensus Statement on Inpatient Glycemic Control (2015)  Target Ranges:  Prepandial:   less than 140 mg/dL      Peak postprandial:   less than 180 mg/dL (1-2 hours)      Critically ill patients:  140 - 180 mg/dL   Results for Corsetti, [name] (MRN [id number]) as of 05/18/2021 09:51  Ref. Range 05/18/2021 07:52  Glucose-Capillary Latest Ref Range: 70 - 99 mg/dL 189 (H)    Admit with: Acute respiratory failure with hypoxia likely combination of fluid overload with worsening left pleural effusion and possible consolidation  History: DM2, ESRD  Home DM Meds: none listed  Current Orders: CBGs TID AC + HS     MD- Note CBG 189 this AM.  + History of type 2 Diabetes.  No meds at home.  Please consider adding Novolog 0-6 units TID AC + HS     --Will follow patient during hospitalization--  Wyn Quaker RN, MSN, CDE Diabetes Coordinator Inpatient Glycemic Control Team Team Pager: [phone] (8a-5p)

## 2021-05-18 NOTE — Procedures (Signed)
PROCEDURE SUMMARY:  Successful US guided left thoracentesis. Yielded 1 L of clear dark yellow fluid. Pt tolerated procedure well. No immediate complications.  Specimen was sent for labs. CXR ordered.  EBL < 5 mL  Ascencion Dike PA-C 05/18/2021 11:37 AM

## 2021-05-19 DIAGNOSIS — F0390 Unspecified dementia without behavioral disturbance: Secondary | ICD-10-CM | POA: Diagnosis present

## 2021-05-19 DIAGNOSIS — Z681 Body mass index (BMI) 19 or less, adult: Secondary | ICD-10-CM | POA: Diagnosis not present

## 2021-05-19 DIAGNOSIS — I5043 Acute on chronic combined systolic (congestive) and diastolic (congestive) heart failure: Secondary | ICD-10-CM | POA: Diagnosis present

## 2021-05-19 DIAGNOSIS — E43 Unspecified severe protein-calorie malnutrition: Secondary | ICD-10-CM | POA: Diagnosis present

## 2021-05-19 DIAGNOSIS — J918 Pleural effusion in other conditions classified elsewhere: Secondary | ICD-10-CM | POA: Diagnosis present

## 2021-05-19 DIAGNOSIS — Z992 Dependence on renal dialysis: Secondary | ICD-10-CM | POA: Diagnosis not present

## 2021-05-19 DIAGNOSIS — N186 End stage renal disease: Secondary | ICD-10-CM | POA: Diagnosis present

## 2021-05-19 DIAGNOSIS — L89150 Pressure ulcer of sacral region, unstageable: Secondary | ICD-10-CM | POA: Diagnosis present

## 2021-05-19 DIAGNOSIS — I10 Essential (primary) hypertension: Secondary | ICD-10-CM | POA: Diagnosis not present

## 2021-05-19 DIAGNOSIS — D84821 Immunodeficiency due to drugs: Secondary | ICD-10-CM | POA: Diagnosis present

## 2021-05-19 DIAGNOSIS — I5023 Acute on chronic systolic (congestive) heart failure: Secondary | ICD-10-CM

## 2021-05-19 DIAGNOSIS — E785 Hyperlipidemia, unspecified: Secondary | ICD-10-CM | POA: Diagnosis present

## 2021-05-19 DIAGNOSIS — D649 Anemia, unspecified: Secondary | ICD-10-CM | POA: Diagnosis not present

## 2021-05-19 DIAGNOSIS — J9601 Acute respiratory failure with hypoxia: Secondary | ICD-10-CM | POA: Diagnosis not present

## 2021-05-19 DIAGNOSIS — I132 Hypertensive heart and chronic kidney disease with heart failure and with stage 5 chronic kidney disease, or end stage renal disease: Secondary | ICD-10-CM | POA: Diagnosis present

## 2021-05-19 DIAGNOSIS — D631 Anemia in chronic kidney disease: Secondary | ICD-10-CM | POA: Diagnosis present

## 2021-05-19 DIAGNOSIS — I509 Heart failure, unspecified: Secondary | ICD-10-CM

## 2021-05-19 DIAGNOSIS — G40909 Epilepsy, unspecified, not intractable, without status epilepticus: Secondary | ICD-10-CM | POA: Diagnosis present

## 2021-05-19 DIAGNOSIS — I959 Hypotension, unspecified: Secondary | ICD-10-CM | POA: Diagnosis not present

## 2021-05-19 DIAGNOSIS — K5090 Crohn's disease, unspecified, without complications: Secondary | ICD-10-CM | POA: Diagnosis present

## 2021-05-19 DIAGNOSIS — I5022 Chronic systolic (congestive) heart failure: Secondary | ICD-10-CM | POA: Diagnosis not present

## 2021-05-19 DIAGNOSIS — R0602 Shortness of breath: Secondary | ICD-10-CM | POA: Diagnosis present

## 2021-05-19 DIAGNOSIS — J440 Chronic obstructive pulmonary disease with acute lower respiratory infection: Secondary | ICD-10-CM | POA: Diagnosis present

## 2021-05-19 DIAGNOSIS — E1122 Type 2 diabetes mellitus with diabetic chronic kidney disease: Secondary | ICD-10-CM | POA: Diagnosis present

## 2021-05-19 DIAGNOSIS — Z20822 Contact with and (suspected) exposure to covid-19: Secondary | ICD-10-CM | POA: Diagnosis present

## 2021-05-19 DIAGNOSIS — R64 Cachexia: Secondary | ICD-10-CM | POA: Diagnosis present

## 2021-05-19 DIAGNOSIS — J189 Pneumonia, unspecified organism: Secondary | ICD-10-CM | POA: Diagnosis present

## 2021-05-19 DIAGNOSIS — J9621 Acute and chronic respiratory failure with hypoxia: Secondary | ICD-10-CM | POA: Diagnosis present

## 2021-05-19 DIAGNOSIS — I255 Ischemic cardiomyopathy: Secondary | ICD-10-CM | POA: Diagnosis present

## 2021-05-19 DIAGNOSIS — E1151 Type 2 diabetes mellitus with diabetic peripheral angiopathy without gangrene: Secondary | ICD-10-CM | POA: Diagnosis present

## 2021-05-19 DIAGNOSIS — I083 Combined rheumatic disorders of mitral, aortic and tricuspid valves: Secondary | ICD-10-CM | POA: Diagnosis present

## 2021-05-19 DIAGNOSIS — Z7189 Other specified counseling: Secondary | ICD-10-CM | POA: Diagnosis not present

## 2021-05-19 LAB — CBC WITH DIFFERENTIAL/PLATELET
Abs Immature Granulocytes: 0.35 10*3/uL — ABNORMAL HIGH (ref 0.00–0.07)
Basophils Absolute: 0 10*3/uL (ref 0.0–0.1)
Basophils Relative: 0 %
Eosinophils Absolute: 0 10*3/uL (ref 0.0–0.5)
Eosinophils Relative: 0 %
HCT: 23.5 % — ABNORMAL LOW (ref 39.0–52.0)
Hemoglobin: 7.2 g/dL — ABNORMAL LOW (ref 13.0–17.0)
Immature Granulocytes: 6 %
Lymphocytes Relative: 8 %
Lymphs Abs: 0.5 10*3/uL — ABNORMAL LOW (ref 0.7–4.0)
MCH: 26.1 pg (ref 26.0–34.0)
MCHC: 30.6 g/dL (ref 30.0–36.0)
MCV: 85.1 fL (ref 80.0–100.0)
Monocytes Absolute: 0.1 10*3/uL (ref 0.1–1.0)
Monocytes Relative: 1 %
Neutro Abs: 5.2 10*3/uL (ref 1.7–7.7)
Neutrophils Relative %: 85 %
Platelets: 93 10*3/uL — ABNORMAL LOW (ref 150–400)
RBC: 2.76 MIL/uL — ABNORMAL LOW (ref 4.22–5.81)
RDW: 20.3 % — ABNORMAL HIGH (ref 11.5–15.5)
Smear Review: NORMAL
WBC: 6.1 10*3/uL (ref 4.0–10.5)
nRBC: 0 % (ref 0.0–0.2)

## 2021-05-19 LAB — BASIC METABOLIC PANEL
Anion gap: 14 (ref 5–15)
BUN: 60 mg/dL — ABNORMAL HIGH (ref 6–20)
CO2: 26 mmol/L (ref 22–32)
Calcium: 8.5 mg/dL — ABNORMAL LOW (ref 8.9–10.3)
Chloride: 97 mmol/L — ABNORMAL LOW (ref 98–111)
Creatinine, Ser: 6.88 mg/dL — ABNORMAL HIGH (ref 0.61–1.24)
GFR, Estimated: 9 mL/min — ABNORMAL LOW (ref >60)
Glucose, Bld: 200 mg/dL — ABNORMAL HIGH (ref 70–99)
Potassium: 5.3 mmol/L — ABNORMAL HIGH (ref 3.5–5.1)
Sodium: 137 mmol/L (ref 135–145)

## 2021-05-19 LAB — PROTEIN, BODY FLUID (OTHER): Total Protein, Body Fluid Other: 1.8 g/dL

## 2021-05-19 LAB — GLUCOSE, CAPILLARY
Glucose-Capillary: 218 mg/dL — ABNORMAL HIGH (ref 70–99)
Glucose-Capillary: 314 mg/dL — ABNORMAL HIGH (ref 70–99)
Glucose-Capillary: 309 mg/dL — ABNORMAL HIGH (ref 70–99)
Glucose-Capillary: 257 mg/dL — ABNORMAL HIGH (ref 70–99)
Glucose-Capillary: 278 mg/dL — ABNORMAL HIGH (ref 70–99)

## 2021-05-19 LAB — PATHOLOGIST SMEAR REVIEW

## 2021-05-19 LAB — D-DIMER, QUANTITATIVE: D-Dimer, Quant: 1.22 ug/mL-FEU — ABNORMAL HIGH (ref 0.00–0.50)

## 2021-05-19 LAB — PHOSPHORUS: Phosphorus: 6.9 mg/dL — ABNORMAL HIGH (ref 2.5–4.6)

## 2021-05-19 LAB — ACID FAST SMEAR (AFB, MYCOBACTERIA): Acid Fast Smear: NEGATIVE

## 2021-05-19 LAB — CYTOLOGY - NON PAP

## 2021-05-19 MED ORDER — ISOSORBIDE DINITRATE 10 MG PO TABS
10.0000 mg | ORAL_TABLET | Freq: Three times a day (TID) | ORAL | Status: DC
  Administered 2021-05-19 – 2021-05-20 (×2): 10 mg via ORAL
  Filled 2021-05-19 (×3): qty 1

## 2021-05-19 MED ORDER — ASCORBIC ACID 500 MG PO TABS
500.0000 mg | ORAL_TABLET | Freq: Two times a day (BID) | ORAL | Status: DC
  Administered 2021-05-19 – 2021-05-26 (×13): 500 mg via ORAL
  Filled 2021-05-19 (×13): qty 1

## 2021-05-19 MED ORDER — INSULIN ASPART 100 UNIT/ML IJ SOLN: 0.0000 [IU] | Freq: Three times a day (TID) | INTRAMUSCULAR | Status: DC

## 2021-05-19 MED ORDER — INSULIN ASPART 100 UNIT/ML IJ SOLN
0.0000 [IU] | Freq: Every day | INTRAMUSCULAR | Status: DC
Start: 2021-05-19 — End: 2021-05-20
  Administered 2021-05-19: 3 [IU] via SUBCUTANEOUS
  Filled 2021-05-19: qty 1

## 2021-05-19 MED ORDER — SEVELAMER CARBONATE 800 MG PO TABS
1600.0000 mg | ORAL_TABLET | Freq: Three times a day (TID) | ORAL | Status: DC
  Administered 2021-05-19 – 2021-05-26 (×19): 1600 mg via ORAL
  Filled 2021-05-19 (×19): qty 2

## 2021-05-19 MED ORDER — FERRIC CITRATE 1 GM 210 MG(FE) PO TABS
420.0000 mg | ORAL_TABLET | Freq: Three times a day (TID) | ORAL | Status: DC
  Administered 2021-05-19 – 2021-05-26 (×18): 420 mg via ORAL
  Filled 2021-05-19 (×24): qty 2

## 2021-05-19 MED ORDER — EPOETIN ALFA 10000 UNIT/ML IJ SOLN
10000.0000 [IU] | INTRAMUSCULAR | Status: DC
Start: 2021-05-20 — End: 2021-05-26
  Administered 2021-05-20 – 2021-05-22 (×2): 10000 [IU] via INTRAVENOUS
  Filled 2021-05-19 (×3): qty 1

## 2021-05-19 NOTE — Progress Notes (Signed)
Pt was not awake/alert enough to do the [facility]

## 2021-05-19 NOTE — Progress Notes (Signed)
CRITICAL CARE PROGRESS NOTE    Name: [name] MRN: [id number] DOB: 1961-06-20     LOS: 0   SUBJECTIVE FINDINGS & SIGNIFICANT EVENTS    Patient description:  60 M with PMH of ESRD on MWF, htn, seizures, HFrEF, hidradenitis, came in due to acute on chronic hypoxemic respiratory failure.   In ER patient required HFNC at 10L. He had pleural effusion found on chest imaging and is s/p thoracentesis. PCCM consultation for acute respiratory distress with hypoxemia.    05/19/21- patient is improved, he is able to feed himself.  There is plan for HD with net removal of fluid.  Treating for CHF exacerbation with pleural effusion.   Microbiology/Sepsis markers: Results for orders placed or performed during the hospital encounter of 05/18/21  Resp Panel by RT-PCR (Flu A&B, Covid) Nasopharyngeal Swab     Status: None   Collection Time: 05/18/21  1:43 AM   Specimen: Nasopharyngeal Swab; Nasopharyngeal(NP) swabs in vial transport medium  Result Value Ref Range Status   SARS Coronavirus 2 by RT PCR NEGATIVE NEGATIVE Final    Comment: (NOTE) SARS-CoV-2 target nucleic acids are NOT DETECTED.  The SARS-CoV-2 RNA is generally detectable in upper respiratory specimens during the acute phase of infection. The lowest concentration of SARS-CoV-2 viral copies this assay can detect is 138 copies/mL. A negative result does not preclude SARS-Cov-2 infection and should not be used as the sole basis for treatment or other patient management decisions. A negative result may occur with  improper specimen collection/handling, submission of specimen other than nasopharyngeal swab, presence of viral mutation(s) within the areas targeted by this assay, and inadequate number of viral copies(<138 copies/mL). A negative result must be  combined with clinical observations, patient history, and epidemiological information. The expected result is Negative.  Fact Sheet for Patients:  EntrepreneurPulse.com.au  Fact Sheet for Healthcare Providers:  IncredibleEmployment.be  This test is no t yet approved or cleared by the [place] FDA and  has been authorized for detection and/or diagnosis of SARS-CoV-2 by FDA under an Emergency Use Authorization (EUA). This EUA will remain  in effect (meaning this test can be used) for the duration of the COVID-19 declaration under Section 564(b)(1) of the Act, 21 U.S.C.section 360bbb-3(b)(1), unless the authorization is terminated  or revoked sooner.       Influenza A by PCR NEGATIVE NEGATIVE Final   Influenza B by PCR NEGATIVE NEGATIVE Final    Comment: (NOTE) The Xpert Xpress SARS-CoV-2/FLU/RSV plus assay is intended as an aid in the diagnosis of influenza from Nasopharyngeal swab specimens and should not be used as a sole basis for treatment. Nasal washings and aspirates are unacceptable for Xpert Xpress SARS-CoV-2/FLU/RSV testing.  Fact Sheet for Patients: EntrepreneurPulse.com.au  Fact Sheet for Healthcare Providers: IncredibleEmployment.be  This test is not yet approved or cleared by the [place] FDA and has been authorized for detection and/or diagnosis of SARS-CoV-2 by FDA under an Emergency Use Authorization (EUA). This EUA will remain in effect (meaning this test can be used) for the duration of the COVID-19 declaration under Section 564(b)(1) of the Act, 21 U.S.C. section 360bbb-3(b)(1), unless the authorization is terminated or revoked.  Performed at [facility], [address] [place], [place] [address]   Blood culture (routine x 2)     Status: None (Preliminary result)   Collection Time: 05/18/21  1:44 AM   Specimen: BLOOD  Result Value Ref Range Status   Specimen  Description BLOOD RIGHT ANTECUBITAL  Final   Special Requests   Final    BOTTLES DRAWN AEROBIC AND ANAEROBIC Blood Culture adequate volume   Culture   Final    NO GROWTH 1 DAY Performed at [facility], [address] [place], [place] [address]    Report Status PENDING  Incomplete  Blood culture (routine x 2)     Status: None (Preliminary result)   Collection Time: 05/18/21  1:44 AM   Specimen: BLOOD  Result Value Ref Range Status   Specimen Description BLOOD BLOOD RIGHT FOREARM  Final   Special Requests   Final    BOTTLES DRAWN AEROBIC AND ANAEROBIC Blood Culture adequate volume   Culture   Final    NO GROWTH 1 DAY Performed at [facility], [address] [place], [place] [address]    Report Status PENDING  Incomplete  Body fluid culture w Gram Stain     Status: None (Preliminary result)   Collection Time: 05/18/21 11:25 AM   Specimen: PATH Cytology Pleural fluid  Result Value Ref Range Status   Specimen Description   Final    PLEURAL Performed at [facility], [address] [place], [place] [address]    Special Requests   Final    NONE Performed at [facility], [address], [place], [place] [address]    Gram Stain   Final    RARE WBC PRESENT, PREDOMINANTLY MONONUCLEAR NO ORGANISMS SEEN Performed at [facility], [place] [address], [place],  [address]    Culture PENDING  Incomplete   Report Status PENDING  Incomplete    Anti-infectives:  Anti-infectives (From admission, onward)    Start     Dose/Rate Route Frequency Ordered Stop   05/20/21 0600  levofloxacin (LEVAQUIN) IVPB 500 mg  Status:  Discontinued       See Hyperspace for full Linked Orders Report.   500 mg 100 mL/hr over 60 Minutes Intravenous Every 48 hours 05/18/21 0558 05/18/21 0601   05/20/21 0600  cefTRIAXone (ROCEPHIN) 1 g in sodium chloride 0.9 % 100 mL IVPB        1 g 200 mL/hr over 30 Minutes Intravenous Every 24 hours 05/18/21 1224      05/20/21 0600  azithromycin (ZITHROMAX) 500 mg in sodium chloride 0.9 % 250 mL IVPB        500 mg 250 mL/hr over 60 Minutes Intravenous Every 24 hours 05/18/21 1224     05/20/21 0400  levofloxacin (LEVAQUIN) IVPB 500 mg  Status:  Discontinued        500 mg 100 mL/hr over 60 Minutes Intravenous Every 48 hours 05/18/21 0602 05/18/21 1224   05/18/21 0600  levofloxacin (LEVAQUIN) IVPB 750 mg  Status:  Discontinued       See Hyperspace for full Linked Orders Report.   750 mg 100 mL/hr over 90 Minutes Intravenous  Once 05/18/21 0558 05/18/21 0601   05/18/21 0345  levofloxacin (LEVAQUIN) IVPB 750 mg        750 mg 100 mL/hr over 90 Minutes Intravenous  Once 05/18/21 0335 05/18/21 0526        Consults: Treatment Team:  Ottie Glazier, MD     PAST MEDICAL HISTORY   Past Medical History:  Diagnosis Date   Acute metabolic encephalopathy 02/23/5276   Anemia    Crohn disease ([place])    Diabetes mellitus without complication ([place])    DVT of lower extremity (deep venous thrombosis) ([place]) 2016   Empyema ([place]) 05/20/2017  Encephalopathy 12/04/2017   Fall at home, initial encounter 09/12/2020   Hidradenitis suppurativa    Hypertension    ICH (intracerebral hemorrhage) ([place])    Peritonitis ([place]) 04/21/2017   Pyogenic arthritis of knee ([place]) 02/04/2016   Renal disorder    Sepsis ([place]) 01/12/2018   Stroke [facility])      SURGICAL HISTORY   Past Surgical History:  Procedure Laterality Date   A/V FISTULAGRAM Left 02/24/2021   Procedure: A/V FISTULAGRAM;  Surgeon: Katha Cabal, MD;  Location: [place] [facility];  Service: Cardiovascular;  Laterality: Left;   ABDOMINAL SURGERY     AMPUTATION FINGER Left 06/2019   PR AMPUTATION LONG FINGER/THUMB+FLAPS [facility]   ANGIOPLASTY Left    left fem-pop at [facility] 04-11-2018   BELOW KNEE LEG AMPUTATION Right 08/2017   [facility]   COLONOSCOPY     COLONOSCOPY WITH PROPOFOL N/A 10/28/2020   Procedure: COLONOSCOPY WITH PROPOFOL;  Surgeon: Lin Landsman, MD;   Location: [place];  Service: Gastroenterology;  Laterality: N/A;   COLONOSCOPY WITH PROPOFOL N/A 11/21/2020   Procedure: COLONOSCOPY WITH PROPOFOL;  Surgeon: [name], MD;  [facility] ENDOSCOPY;  Service: Endoscopy;  Laterality: N/A;   DIALYSIS/PERMA CATHETER INSERTION N/A 12/09/2017   Procedure: DIALYSIS/PERMA CATHETER INSERTION;  Surgeon: Katha Cabal, MD;  Location: [place] [facility];  Service: Cardiovascular;  Laterality: N/A;   DIALYSIS/PERMA CATHETER INSERTION N/A 12/12/2017   Procedure: DIALYSIS/PERMA CATHETER INSERTION;  Surgeon: Algernon Huxley, MD;  Location: [place] [facility];  Service: Cardiovascular;  Laterality: N/A;   DIALYSIS/PERMA CATHETER REMOVAL Left 12/09/2017   Procedure: DIALYSIS/PERMA CATHETER REMOVAL;  Surgeon: Katha Cabal, MD;  Location: [place] [facility];  Service: Cardiovascular;  Laterality: Left;   ESOPHAGOGASTRODUODENOSCOPY (EGD) WITH PROPOFOL N/A 11/20/2020   Procedure: ESOPHAGOGASTRODUODENOSCOPY (EGD) WITH PROPOFOL;  Surgeon: [name], MD;  Location: [facility] ENDOSCOPY;  Service: Endoscopy;  Laterality: N/A;   KNEE SURGERY Left 02/04/2016   [facility]   LEG AMPUTATION THROUGH LOWER TIBIA AND FIBULA Left 06/22/2018   [facility]   LOWER EXTREMITY ANGIOGRAPHY Right 08/08/2017   Procedure: Lower Extremity Angiography;  Surgeon: Algernon Huxley, MD;  Location: [place] [facility];  Service: Cardiovascular;  Laterality: Right;   LOWER EXTREMITY ANGIOGRAPHY Right 08/22/2017   Procedure: Lower Extremity Angiography;  Surgeon: Algernon Huxley, MD;  Location: [place] [facility];  Service: Cardiovascular;  Laterality: Right;   LOWER EXTREMITY INTERVENTION  08/08/2017   Procedure: LOWER EXTREMITY INTERVENTION;  Surgeon: Algernon Huxley, MD;  Location: [place] [facility];  Service: Cardiovascular;;   LOWER EXTREMITY INTERVENTION  08/22/2017   Procedure: LOWER EXTREMITY INTERVENTION;  Surgeon: Algernon Huxley, MD;  Location: [place] [facility];  Service: Cardiovascular;;      FAMILY HISTORY   Family History  Problem Relation Age of Onset   Irritable bowel syndrome Sister    Diabetes Sister    Heart disease Mother    Diabetes Mother    Heart disease Father    Rheumatic fever Father        as child   Psoriasis Brother    Arthritis Brother    Diabetes Sister    Diabetes Sister      SOCIAL HISTORY   Social History   Tobacco Use   Smoking status: Former   Smokeless tobacco: Never   Tobacco comments:    smokes marijuana  Vaping Use   Vaping Use: Never used  Substance Use Topics   Alcohol use: No  Drug use: Yes    Types: Marijuana     MEDICATIONS   Current Medication:  Current Facility-Administered Medications:    acetaminophen (TYLENOL) tablet 650 mg, 650 mg, Oral, Q6H PRN **OR** acetaminophen (TYLENOL) suppository 650 mg, 650 mg, Rectal, Q6H PRN, Rise Patience, MD   amLODipine (NORVASC) tablet 10 mg, 10 mg, Oral, Daily, Rise Patience, MD, 10 mg at 05/19/21 0818   arformoterol (BROVANA) nebulizer solution 15 mcg, 15 mcg, Nebulization, BID, Priscella Mann, Sudheer B, MD, 15 mcg at 05/19/21 9977   aspirin chewable tablet 81 mg, 81 mg, Oral, Daily, Rise Patience, MD, 81 mg at 05/19/21 0817   atorvastatin (LIPITOR) tablet 80 mg, 80 mg, Oral, Daily, Rise Patience, MD, 80 mg at 05/19/21 0817   [START ON 05/20/2021] azithromycin (ZITHROMAX) 500 mg in sodium chloride 0.9 % 250 mL IVPB, 500 mg, Intravenous, Q24H, Sreenath, Sudheer B, MD   budesonide (PULMICORT) nebulizer solution 0.25 mg, 0.25 mg, Nebulization, BID, Priscella Mann, Sudheer B, MD, 0.25 mg at 05/19/21 0711   carvedilol (COREG) tablet 25 mg, 25 mg, Oral, BID, Rise Patience, MD, 25 mg at 05/19/21 0817   [START ON 05/20/2021] cefTRIAXone (ROCEPHIN) 1 g in sodium chloride 0.9 % 100 mL IVPB, 1 g, Intravenous, Q24H, Sreenath, Sudheer B, MD   chlorhexidine (PERIDEX) 0.12 % solution 15 mL, 15 mL, Mouth Rinse, BID, Rama Mcclintock, MD, 15 mL at 05/19/21 0818    Chlorhexidine Gluconate Cloth 2 % PADS 6 each, 6 each, Topical, Q0600, Colon Flattery, NP, 6 each at 05/18/21 1522   Chlorhexidine Gluconate Cloth 2 % PADS 6 each, 6 each, Topical, Q0600, Ottie Glazier, MD, 6 each at 05/19/21 0500   cloNIDine (CATAPRES) tablet 0.2 mg, 0.2 mg, Oral, BID, Rise Patience, MD, 0.2 mg at 05/19/21 0817   [START ON 05/20/2021] epoetin alfa (EPOGEN) injection 10,000 Units, 10,000 Units, Intravenous, Q M,W,F-HD, Kolluru, Sarath, MD   feeding supplement (NEPRO CARB STEADY) liquid 237 mL, 237 mL, Oral, TID BM, Sreenath, Sudheer B, MD, 237 mL at 05/19/21 0858   ferric citrate (AURYXIA) tablet 420 mg, 420 mg, Oral, TID WC, Kolluru, Sarath, MD   furosemide (LASIX) tablet 80 mg, 80 mg, Oral, BID, Rise Patience, MD, 80 mg at 05/19/21 0817   gabapentin (NEURONTIN) capsule 400 mg, 400 mg, Oral, QHS, Gean Birchwood N, MD, 400 mg at 05/18/21 2100   heparin injection 5,000 Units, 5,000 Units, Subcutaneous, Q8H, Rise Patience, MD, 5,000 Units at 05/19/21 4142   hydrALAZINE (APRESOLINE) tablet 100 mg, 100 mg, Oral, Q8H, Rise Patience, MD, 100 mg at 05/19/21 0539   insulin aspart (novoLOG) injection 0-6 Units, 0-6 Units, Subcutaneous, TID WC, Sreenath, Sudheer B, MD, 2 Units at 05/19/21 0819   ipratropium-albuterol (DUONEB) 0.5-2.5 (3) MG/3ML nebulizer solution 3 mL, 3 mL, Nebulization, Q4H, Sreenath, Sudheer B, MD, 3 mL at 05/19/21 0711   levETIRAcetam (KEPPRA) tablet 500 mg, 500 mg, Oral, Daily, Rise Patience, MD, 500 mg at 05/18/21 2137   MEDLINE mouth rinse, 15 mL, Mouth Rinse, q12n4p, Liyat Faulkenberry, MD   multivitamin (RENA-VIT) tablet 1 tablet, 1 tablet, Oral, QHS, Rise Patience, MD, 1 tablet at 05/18/21 2100   sevelamer carbonate (RENVELA) tablet 1,600 mg, 1,600 mg, Oral, TID WC, Kolluru, Sarath, MD    ALLERGIES   Methotrexate, Vancomycin, Cefepime, and Tape    REVIEW OF SYSTEMS       PHYSICAL EXAMINATION   Vital  Signs: Temp:  [97.6 F (36.4 C)-99.4 F (37.4 C)]  97.6 F (36.4 C) (08/02 0414) Pulse Rate:  [71-97] 71 (08/01 2300) Resp:  [15-28] 15 (08/01 2300) BP: (133-183)/(63-96) 138/63 (08/02 0800) SpO2:  [69 %-100 %] 100 % (08/01 2300) FiO2 (%):  [40 %-100 %] 60 % (08/02 0444) Weight:  [56.4 kg-58.9 kg] 56.4 kg (08/02 0414)  GENERAL:older then stated age , mild distress HEAD: Normocephalic, atraumatic.  EYES: Pupils equal, round, reactive to light.  No scleral icterus.  MOUTH: Moist mucosal membrane. NECK: Supple. No thyromegaly. No nodules. No JVD.  PULMONARY: rhonchi bilaterally  CARDIOVASCULAR: S1 and S2. Regular rate and rhythm. No murmurs, rubs, or gallops.  GASTROINTESTINAL: Soft, nontender, non-distended. No masses. Positive bowel sounds. No hepatosplenomegaly.  MUSCULOSKELETAL: No swelling, clubbing, or edema. Bilaterl BKA amputation NEUROLOGIC: Mild distress due to acute illness SKIN:intact,warm,dry   PERTINENT DATA     Infusions:  [START ON 05/20/2021] azithromycin     [START ON 05/20/2021] cefTRIAXone (ROCEPHIN)  IV     Scheduled Medications:  amLODipine  10 mg Oral Daily   arformoterol  15 mcg Nebulization BID   aspirin  81 mg Oral Daily   atorvastatin  80 mg Oral Daily   budesonide (PULMICORT) nebulizer solution  0.25 mg Nebulization BID   carvedilol  25 mg Oral BID   chlorhexidine  15 mL Mouth Rinse BID   Chlorhexidine Gluconate Cloth  6 each Topical Q0600   Chlorhexidine Gluconate Cloth  6 each Topical Q0600   cloNIDine  0.2 mg Oral BID   [START ON 05/20/2021] epoetin (EPOGEN/PROCRIT) injection  10,000 Units Intravenous Q M,W,F-HD   feeding supplement (NEPRO CARB STEADY)  237 mL Oral TID BM   ferric citrate  420 mg Oral TID WC   furosemide  80 mg Oral BID   gabapentin  400 mg Oral QHS   heparin  5,000 Units Subcutaneous Q8H   hydrALAZINE  100 mg Oral Q8H   insulin aspart  0-6 Units Subcutaneous TID WC   ipratropium-albuterol  3 mL Nebulization Q4H    levETIRAcetam  500 mg Oral Daily   mouth rinse  15 mL Mouth Rinse q12n4p   multivitamin  1 tablet Oral QHS   sevelamer carbonate  1,600 mg Oral TID WC   PRN Medications: acetaminophen **OR** acetaminophen Hemodynamic parameters:   Intake/Output: 08/01 0701 - 08/02 0700 In: -  Out: -419   Ventilator  Settings: FiO2 (%):  [40 %-100 %] 60 %     LAB RESULTS:  Basic Metabolic Panel: Recent Labs  Lab 05/12/21 1928 05/15/21 0649 05/18/21 0143 05/18/21 0547 05/19/21 0405  NA 136 136 134*  --  137  K 3.3* 4.0 4.6  --  5.3*  CL 95* 95* 95*  --  97*  CO2 _0 --  26  GLUCOSE 170* 204* 183*  --  200*  BUN 37* 40* 49*  --  60*  CREATININE 5.49* 5.29* 6.30* 6.48* 6.88*  CALCIUM 8.2* 8.8* 8.7*  --  8.5*  PHOS  --   --   --   --  6.9*    Liver Function Tests: No results for input(s): AST, ALT, ALKPHOS, BILITOT, PROT, ALBUMIN in the last 168 hours. No results for input(s): LIPASE, AMYLASE in the last 168 hours. No results for input(s): AMMONIA in the last 168 hours. CBC: Recent Labs  Lab 05/12/21 1928 05/18/21 0143 05/18/21 0547 05/19/21 0405  WBC 6.2 6.1 6.9 6.1  NEUTROABS  --  4.5  --  5.2  HGB 8.2* 6.8* 7.8* 7.2*  HCT 26.8* 22.8* 25.9* 23.5*  MCV 83.0 84.8 83.5 85.1  PLT 154 97* 123* 93*    Cardiac Enzymes: No results for input(s): CKTOTAL, CKMB, CKMBINDEX, TROPONINI in the last 168 hours. BNP: Invalid input(s): POCBNP CBG: Recent Labs  Lab 05/18/21 0752 05/18/21 1319 05/18/21 1644 05/18/21 2338 05/19/21 0742  GLUCAP 189* 181* 133* 164* 218*        IMAGING RESULTS:  Imaging: CT CHEST WO CONTRAST  Result Date: 05/18/2021 CLINICAL DATA:  60 year old male with history of chest pain or shortness of breath. EXAM: CT CHEST WITHOUT CONTRAST TECHNIQUE: Multidetector CT imaging of the chest was performed following the standard protocol without IV contrast. COMPARISON:  Chest CT 05/13/2021. FINDINGS: Cardiovascular: Heart size is enlarged. There is no  significant pericardial fluid, thickening or pericardial calcification. There is aortic atherosclerosis, as well as atherosclerosis of the great vessels of the mediastinum and the coronary arteries, including calcified atherosclerotic plaque in the left main, left anterior descending, left circumflex and right coronary arteries. Calcifications of the aortic valve and mitral annulus. Vascular stent in the left innominate vein. Mediastinum/Nodes: Assessment for lymphadenopathy is exceedingly limited on today's noncontrast CT examination, particularly in light of the patient's thin body habitus. Thickening of the esophagus most notably in the mid esophagus (axial image 41 of series 2), poorly evaluated on today's noncontrast CT examination. No axillary lymphadenopathy. Lungs/Pleura: Right-sided pleural calcification. No left pleural calcification identified. Small right and moderate left pleural effusions. The majority of the left pleural effusion is sub pulmonic in distribution. There continues to be generalized mild ground-glass attenuation and interlobular septal thickening in the lungs bilaterally suggesting a background of mild interstitial pulmonary edema. Compared to the prior study, the left lower lobe is now opacified with only a small amount of volume loss, indicating predominantly airspace consolidation rather than simple atelectasis. Upper Abdomen: Aortic atherosclerosis. Musculoskeletal: There are no aggressive appearing lytic or blastic lesions noted in the visualized portions of the skeleton. IMPRESSION: 1. There continues to be evidence of congestive heart failure, as above. However, today's study also demonstrates new airspace consolidation in the left lower lobe concerning for pneumonia. 2. Small right and moderate left pleural effusions. Left pleural effusion has increased substantially in size and is predominantly sub pulmonic in location, potentially loculated. 3. Aortic atherosclerosis, in  addition to left main and 3 vessel coronary artery disease. Please note that although the presence of coronary artery calcium documents the presence of coronary artery disease, the severity of this disease and any potential stenosis cannot be assessed on this non-gated CT examination. Assessment for potential risk factor modification, dietary therapy or pharmacologic therapy may be warranted, if clinically indicated. 4. There are calcifications of the aortic valve and mitral annulus. Echocardiographic correlation for evaluation of potential valvular dysfunction may be warranted if clinically indicated. Aortic Atherosclerosis (ICD10-I70.0). Electronically Signed   By: Vinnie Langton M.D.   On: 05/18/2021 05:33   DG Chest Port 1 View  Result Date: 05/18/2021 CLINICAL DATA:  Respiratory distress. Recent left-sided thoracentesis. EXAM: PORTABLE CHEST 1 VIEW COMPARISON:  05/18/2021 FINDINGS: Portable semi upright view of the chest was obtained. Grossly stable cardiomediastinal contours. Aortic atherosclerosis. Moderate-sized right-sided pleural effusion with extensive airspace opacities within the right lung. Diffuse interstitial opacities throughout the left lung with slight improved aeration of the left lung base. No pneumothorax. Vascular stents again seen in the left axillary and brachiocephalic regions. IMPRESSION: 1. Slight improved aeration of the left lung base post thoracentesis. No pneumothorax. 2. Otherwise stable  chest with multifocal opacities and moderate right-sided pleural effusion. Electronically Signed   By: Davina Poke D.O.   On: 05/18/2021 15:37   DG Chest Port 1 View  Result Date: 05/18/2021 CLINICAL DATA:  Status post left thoracentesis EXAM: PORTABLE CHEST 1 VIEW COMPARISON:  05/18/2021, 1:54 a.m. FINDINGS: Interval reduction in volume of a previously seen large left pleural effusion, now small and layering, with improved aeration of the left lung. Interval increase in volume of  right-sided pleural effusion with increased consolidation of the right lung. Cardiomegaly. Left axillary and brachiocephalic stents. IMPRESSION: 1. Interval reduction in volume of a previously seen large left pleural effusion, now small and layering, with improved aeration of the left lung. No pneumothorax. 2. Interval increase in volume of right-sided pleural effusion with increased consolidation of the right lung. 3. Cardiomegaly. Electronically Signed   By: Eddie Candle M.D.   On: 05/18/2021 12:51   DG Chest Port 1 View  Result Date: 05/18/2021 CLINICAL DATA:  Shortness of breath. EXAM: PORTABLE CHEST 1 VIEW COMPARISON:  Chest x-ray 05/14/2021, CT chest 05/13/2021 FINDINGS: Redemonstration of vascular stents overlying the mediastinum as well as left axilla. Enlarged cardiac silhouette. The heart size and mediastinal contours are grossly unchanged. Aortic calcification. No focal consolidation. No pulmonary edema. Interval increase in size of a moderate volume left pleural effusion. At least trace right pleural effusion. Patchy interstitial and airspace opacities. No pneumothorax. No acute osseous abnormality. IMPRESSION: 1. Interval increase in size of a moderate volume left pleural effusion. 2. At least trace right pleural effusion. 3. Patchy interstitial and airspace opacities. Findings could represent infection versus inflammation or pulmonary edema. Electronically Signed   By: Iven Finn M.D.   On: 05/18/2021 02:19   US THORACENTESIS ASP PLEURAL SPACE W/IMG GUIDE  Result Date: 05/18/2021 INDICATION: Shortness of breath. Left-sided pleural effusion. Request for diagnostic and therapeutic thoracentesis. EXAM: ULTRASOUND GUIDED LEFT THORACENTESIS MEDICATIONS: 1% plain lidocaine, 5 mL COMPLICATIONS: None immediate. PROCEDURE: An ultrasound guided thoracentesis was thoroughly discussed with the patient and questions answered. The benefits, risks, alternatives and complications were also discussed. The  patient understands and wishes to proceed with the procedure. Written consent was obtained. Ultrasound was performed to localize and mark an adequate pocket of fluid in the left chest. The area was then prepped and draped in the normal sterile fashion. 1% Lidocaine was used for local anesthesia. Under ultrasound guidance a 6 Fr Safe-T-Centesis catheter was introduced. Thoracentesis was performed. The catheter was removed and a dressing applied. FINDINGS: A total of approximately 1 L of clear, dark yellow fluid was removed. Samples were sent to the laboratory as requested by the clinical team. IMPRESSION: Successful ultrasound guided left thoracentesis yielding 1 L of pleural fluid. Read by: Ascencion Dike PA-C Electronically Signed   By: Jacqulynn Cadet M.D.   On: 05/18/2021 12:57   _0 @ DG Chest Port 1 View  Result Date: 05/18/2021 CLINICAL DATA:  Respiratory distress. Recent left-sided thoracentesis. EXAM: PORTABLE CHEST 1 VIEW COMPARISON:  05/18/2021 FINDINGS: Portable semi upright view of the chest was obtained. Grossly stable cardiomediastinal contours. Aortic atherosclerosis. Moderate-sized right-sided pleural effusion with extensive airspace opacities within the right lung. Diffuse interstitial opacities throughout the left lung with slight improved aeration of the left lung base. No pneumothorax. Vascular stents again seen in the left axillary and brachiocephalic regions. IMPRESSION: 1. Slight improved aeration of the left lung base post thoracentesis. No pneumothorax. 2. Otherwise stable chest with multifocal opacities and moderate right-sided pleural effusion. Electronically Signed  By: Davina Poke D.O.   On: 05/18/2021 15:37   DG Chest Port 1 View  Result Date: 05/18/2021 CLINICAL DATA:  Status post left thoracentesis EXAM: PORTABLE CHEST 1 VIEW COMPARISON:  05/18/2021, 1:54 a.m. FINDINGS: Interval reduction in volume of a previously seen large left pleural effusion, now small and  layering, with improved aeration of the left lung. Interval increase in volume of right-sided pleural effusion with increased consolidation of the right lung. Cardiomegaly. Left axillary and brachiocephalic stents. IMPRESSION: 1. Interval reduction in volume of a previously seen large left pleural effusion, now small and layering, with improved aeration of the left lung. No pneumothorax. 2. Interval increase in volume of right-sided pleural effusion with increased consolidation of the right lung. 3. Cardiomegaly. Electronically Signed   By: Eddie Candle M.D.   On: 05/18/2021 12:51   US THORACENTESIS ASP PLEURAL SPACE W/IMG GUIDE  Result Date: 05/18/2021 INDICATION: Shortness of breath. Left-sided pleural effusion. Request for diagnostic and therapeutic thoracentesis. EXAM: ULTRASOUND GUIDED LEFT THORACENTESIS MEDICATIONS: 1% plain lidocaine, 5 mL COMPLICATIONS: None immediate. PROCEDURE: An ultrasound guided thoracentesis was thoroughly discussed with the patient and questions answered. The benefits, risks, alternatives and complications were also discussed. The patient understands and wishes to proceed with the procedure. Written consent was obtained. Ultrasound was performed to localize and mark an adequate pocket of fluid in the left chest. The area was then prepped and draped in the normal sterile fashion. 1% Lidocaine was used for local anesthesia. Under ultrasound guidance a 6 Fr Safe-T-Centesis catheter was introduced. Thoracentesis was performed. The catheter was removed and a dressing applied. FINDINGS: A total of approximately 1 L of clear, dark yellow fluid was removed. Samples were sent to the laboratory as requested by the clinical team. IMPRESSION: Successful ultrasound guided left thoracentesis yielding 1 L of pleural fluid. Read by: Ascencion Dike PA-C Electronically Signed   By: Jacqulynn Cadet M.D.   On: 05/18/2021 12:57         ASSESSMENT AND PLAN    -Multidisciplinary rounds held  today  Acute on chronic Hypoxic Respiratory Failure - present on admission  -note patient is immunocompromised on biologic injection with [name] for HS - COVID19 negative   - supplemental O2 during my evaluation Bipap - will perform infectious workup for pneumonia -Respiratory viral panel -serum fungitell -legionella ab -strep pneumoniae ur AG -Histoplasma Ur Ag -sputum resp cultures -AFB sputum expectorated specimen -sputum cytology  -reviewed pertinent imaging with patient today - ESR -on CAP regimen with rocephin /zithromax  Acute on chronic decompensated systolic CHF exacerbation with EF <35%  - agree with lasix 80 bid - appreciate renal and TRH team   -BNP>4500 -oxygen as needed -Lasix as tolerated -follow up cardiac enzymes as indicated ICU monitoring   Left pleural effusion  Monocyte and lymphycyte predominant transudate consistent with CHF/CKD related fluid -empirically treated for CAP with rocephin and zithromax  Renal Failure-ESRD -follow chem 7 -appreciate renal team -follow UO -continue condom Catheter-assess need daily   ID -continue IV abx as prescibed -follow up cultures  GI/Nutrition GI PROPHYLAXIS as indicated DIET-->TF's as tolerated Constipation protocol as indicated  ENDO - ICU hypoglycemic\Hyperglycemia protocol -check FSBS per protocol   ELECTROLYTES -follow labs as needed -replace as needed -pharmacy consultation   DVT/GI PRX ordered -SCDs  TRANSFUSIONS AS NEEDED MONITOR FSBS ASSESS the need for LABS as needed   Critical care provider statement:    Critical care time (minutes):  33   Critical care time was exclusive  of:  Separately billable procedures and treating other patients   Critical care was necessary to treat or prevent imminent or life-threatening deterioration of the following conditions:     Critical care was time spent personally by me on the following activities:  Development of treatment plan with patient or  surrogate, discussions with consultants, evaluation of patient's response to treatment, examination of patient, obtaining history from patient or surrogate, ordering and performing treatments and interventions, ordering and review of laboratory studies and re-evaluation of patient's condition.  I assumed direction of critical care for this patient from another provider in my specialty: no    This document was prepared using Dragon voice recognition software and may include unintentional dictation errors.    Ottie Glazier, M.D.  Division of [facility]

## 2021-05-19 NOTE — Progress Notes (Signed)
PT Cancellation Note  Patient Details Name: LAINE GIOVANETTI MRN: [id number] DOB: [birth year]   Cancelled Treatment:    Reason Eval/Treat Not Completed: Medical issues which prohibited therapy: Pt's Ka 5.3 and BG 314, both trending up and both outside guidelines for participation with PT service.  Will attempt to see pt at a future date/time as medically appropriate.    Linus Salmons PT, DPT 05/19/21, 2:35 PM

## 2021-05-19 NOTE — Progress Notes (Signed)
[facility]  ROUNDING NOTE   Subjective:   Hemodialysis treatment  yesterday. Hypoxic event during treatment. Completed his dialysis treatment. No ultrafiltration  Left thoracentesis with 1 liter of fluid removed.   Transferred to ICU.   This morning, patient is on HFNC. Weaned off BIPAP. Patient is sitting up. Drinking coffee.    Objective:  Vital signs in last 24 hours:  Temp:  [97.6 F (36.4 C)-99.4 F (37.4 C)] 97.6 F (36.4 C) (08/02 0414) Pulse Rate:  [71-97] 71 (08/01 2300) Resp:  [15-28] 15 (08/01 2300) BP: (133-183)/(63-96) 138/63 (08/02 0800) SpO2:  [69 %-100 %] 100 % (08/01 2300) FiO2 (%):  [40 %-100 %] 60 % (08/02 0444) Weight:  [56.4 kg-58.9 kg] 56.4 kg (08/02 0414)  Weight change: 0 kg Filed Weights   05/18/21 0716 05/18/21 1507 05/19/21 0414  Weight: 56.7 kg 58.9 kg 56.4 kg    Intake/Output: I/O last 3 completed shifts: In: -  Out: -419    Intake/Output this shift:  No intake/output data recorded.  Physical Exam: General: NAD, sitting up in bed  Head: Normocephalic, atraumatic. Moist oral mucosal membranes  Eyes: Anicteric  Lungs:  Bilateral crackles, normal breathing effort, [place] O2  Heart: Regular rate and rhythm  Abdomen:  Soft, nontender  Extremities:  Bilateral BKA  Neurologic: Alert, disoriented, moving all four extremities  Skin: No lesions  Access: LUE AVF    Basic Metabolic Panel: Recent Labs  Lab 05/12/21 1928 05/15/21 0649 05/18/21 0143 05/18/21 0547 05/19/21 0405  NA 136 136 134*  --  137  K 3.3* 4.0 4.6  --  5.3*  CL 95* 95* 95*  --  97*  CO2 30 28 27   --  26  GLUCOSE 170* 204* 183*  --  200*  BUN 37* 40* 49*  --  60*  CREATININE 5.49* 5.29* 6.30* 6.48* 6.88*  CALCIUM 8.2* 8.8* 8.7*  --  8.5*  PHOS  --   --   --   --  6.9*     Liver Function Tests: No results for input(s): AST, ALT, ALKPHOS, BILITOT, PROT, ALBUMIN in the last 168 hours.  No results for input(s): LIPASE, AMYLASE in the last 168  hours. No results for input(s): AMMONIA in the last 168 hours.  CBC: Recent Labs  Lab 05/12/21 1928 05/18/21 0143 05/18/21 0547 05/19/21 0405  WBC 6.2 6.1 6.9 6.1  NEUTROABS  --  4.5  --  5.2  HGB 8.2* 6.8* 7.8* 7.2*  HCT 26.8* 22.8* 25.9* 23.5*  MCV 83.0 84.8 83.5 85.1  PLT 154 97* 123* 93*     Cardiac Enzymes: No results for input(s): CKTOTAL, CKMB, CKMBINDEX, TROPONINI in the last 168 hours.  BNP: Invalid input(s): POCBNP  CBG: Recent Labs  Lab 05/18/21 0752 05/18/21 1319 05/18/21 1644 05/18/21 2338 05/19/21 0742  GLUCAP 189* 181* 133* 164* 218*     Microbiology: Results for orders placed or performed during the hospital encounter of 05/18/21  Resp Panel by RT-PCR (Flu A&B, Covid) Nasopharyngeal Swab     Status: None   Collection Time: 05/18/21  1:43 AM   Specimen: Nasopharyngeal Swab; Nasopharyngeal(NP) swabs in vial transport medium  Result Value Ref Range Status   SARS Coronavirus 2 by RT PCR NEGATIVE NEGATIVE Final    Comment: (NOTE) SARS-CoV-2 target nucleic acids are NOT DETECTED.  The SARS-CoV-2 RNA is generally detectable in upper respiratory specimens during the acute phase of infection. The lowest concentration of SARS-CoV-2 viral copies this assay can detect is  138 copies/mL. A negative result does not preclude SARS-Cov-2 infection and should not be used as the sole basis for treatment or other patient management decisions. A negative result may occur with  improper specimen collection/handling, submission of specimen other than nasopharyngeal swab, presence of viral mutation(s) within the areas targeted by this assay, and inadequate number of viral copies(<138 copies/mL). A negative result must be combined with clinical observations, patient history, and epidemiological information. The expected result is Negative.  Fact Sheet for Patients:  EntrepreneurPulse.com.au  Fact Sheet for Healthcare Providers:   IncredibleEmployment.be  This test is no t yet approved or cleared by the [place] FDA and  has been authorized for detection and/or diagnosis of SARS-CoV-2 by FDA under an Emergency Use Authorization (EUA). This EUA will remain  in effect (meaning this test can be used) for the duration of the COVID-19 declaration under Section 564(b)(1) of the Act, 21 U.S.C.section 360bbb-3(b)(1), unless the authorization is terminated  or revoked sooner.       Influenza A by PCR NEGATIVE NEGATIVE Final   Influenza B by PCR NEGATIVE NEGATIVE Final    Comment: (NOTE) The Xpert Xpress SARS-CoV-2/FLU/RSV plus assay is intended as an aid in the diagnosis of influenza from Nasopharyngeal swab specimens and should not be used as a sole basis for treatment. Nasal washings and aspirates are unacceptable for Xpert Xpress SARS-CoV-2/FLU/RSV testing.  Fact Sheet for Patients: EntrepreneurPulse.com.au  Fact Sheet for Healthcare Providers: IncredibleEmployment.be  This test is not yet approved or cleared by the [place] FDA and has been authorized for detection and/or diagnosis of SARS-CoV-2 by FDA under an Emergency Use Authorization (EUA). This EUA will remain in effect (meaning this test can be used) for the duration of the COVID-19 declaration under Section 564(b)(1) of the Act, 21 U.S.C. section 360bbb-3(b)(1), unless the authorization is terminated or revoked.  Performed at [facility], [address] [place], [place] [address]   Blood culture (routine x 2)     Status: None (Preliminary result)   Collection Time: 05/18/21  1:44 AM   Specimen: BLOOD  Result Value Ref Range Status   Specimen Description BLOOD RIGHT ANTECUBITAL  Final   Special Requests   Final    BOTTLES DRAWN AEROBIC AND ANAEROBIC Blood Culture adequate volume   Culture   Final    NO GROWTH 1 DAY Performed at [facility], [address], [place], [place] [address]    Report Status PENDING  Incomplete  Blood culture (routine x 2)     Status: None (Preliminary result)   Collection Time: 05/18/21  1:44 AM   Specimen: BLOOD  Result Value Ref Range Status   Specimen Description BLOOD BLOOD RIGHT FOREARM  Final   Special Requests   Final    BOTTLES DRAWN AEROBIC AND ANAEROBIC Blood Culture adequate volume   Culture   Final    NO GROWTH 1 DAY Performed at [facility], [address] [place], [place] [address]    Report Status PENDING  Incomplete  Body fluid culture w Gram Stain     Status: None (Preliminary result)   Collection Time: 05/18/21 11:25 AM   Specimen: PATH Cytology Pleural fluid  Result Value Ref Range Status   Specimen Description   Final    PLEURAL Performed at [facility], [address] [place], [place] [address]    Special Requests   Final    NONE Performed at [facility], [address] [place],  [place] [address]    Gram Stain   Final    RARE WBC PRESENT, PREDOMINANTLY MONONUCLEAR NO ORGANISMS SEEN Performed at [facility] [address], [place],  [address]    Culture PENDING  Incomplete   Report Status PENDING  Incomplete    Coagulation Studies: No results for input(s): LABPROT, INR in the last 72 hours.  Urinalysis: No results for input(s): COLORURINE, LABSPEC, PHURINE, GLUCOSEU, HGBUR, BILIRUBINUR, KETONESUR, PROTEINUR, UROBILINOGEN, NITRITE, LEUKOCYTESUR in the last 72 hours.  Invalid input(s): APPERANCEUR    Imaging: CT CHEST WO CONTRAST  Result Date: 05/18/2021 CLINICAL DATA:  60 year old male with history of chest pain or shortness of breath. EXAM: CT CHEST WITHOUT CONTRAST TECHNIQUE: Multidetector CT imaging of the chest was performed following the standard protocol without IV contrast. COMPARISON:  Chest CT 05/13/2021. FINDINGS: Cardiovascular: Heart size is enlarged. There is no significant pericardial fluid, thickening or  pericardial calcification. There is aortic atherosclerosis, as well as atherosclerosis of the great vessels of the mediastinum and the coronary arteries, including calcified atherosclerotic plaque in the left main, left anterior descending, left circumflex and right coronary arteries. Calcifications of the aortic valve and mitral annulus. Vascular stent in the left innominate vein. Mediastinum/Nodes: Assessment for lymphadenopathy is exceedingly limited on today's noncontrast CT examination, particularly in light of the patient's thin body habitus. Thickening of the esophagus most notably in the mid esophagus (axial image 41 of series 2), poorly evaluated on today's noncontrast CT examination. No axillary lymphadenopathy. Lungs/Pleura: Right-sided pleural calcification. No left pleural calcification identified. Small right and moderate left pleural effusions. The majority of the left pleural effusion is sub pulmonic in distribution. There continues to be generalized mild ground-glass attenuation and interlobular septal thickening in the lungs bilaterally suggesting a background of mild interstitial pulmonary edema. Compared to the prior study, the left lower lobe is now opacified with only a small amount of volume loss, indicating predominantly airspace consolidation rather than simple atelectasis. Upper Abdomen: Aortic atherosclerosis. Musculoskeletal: There are no aggressive appearing lytic or blastic lesions noted in the visualized portions of the skeleton. IMPRESSION: 1. There continues to be evidence of congestive heart failure, as above. However, today's study also demonstrates new airspace consolidation in the left lower lobe concerning for pneumonia. 2. Small right and moderate left pleural effusions. Left pleural effusion has increased substantially in size and is predominantly sub pulmonic in location, potentially loculated. 3. Aortic atherosclerosis, in addition to left main and 3 vessel coronary artery  disease. Please note that although the presence of coronary artery calcium documents the presence of coronary artery disease, the severity of this disease and any potential stenosis cannot be assessed on this non-gated CT examination. Assessment for potential risk factor modification, dietary therapy or pharmacologic therapy may be warranted, if clinically indicated. 4. There are calcifications of the aortic valve and mitral annulus. Echocardiographic correlation for evaluation of potential valvular dysfunction may be warranted if clinically indicated. Aortic Atherosclerosis (ICD10-I70.0). Electronically Signed   By: Vinnie Langton M.D.   On: 05/18/2021 05:33   DG Chest Port 1 View  Result Date: 05/18/2021 CLINICAL DATA:  Respiratory distress. Recent left-sided thoracentesis. EXAM: PORTABLE CHEST 1 VIEW COMPARISON:  05/18/2021 FINDINGS: Portable semi upright view of the chest was obtained. Grossly stable cardiomediastinal contours. Aortic atherosclerosis. Moderate-sized right-sided pleural effusion with extensive airspace opacities within the right lung. Diffuse interstitial opacities throughout the left lung with slight improved aeration of the left lung base. No pneumothorax. Vascular stents again seen in the  left axillary and brachiocephalic regions. IMPRESSION: 1. Slight improved aeration of the left lung base post thoracentesis. No pneumothorax. 2. Otherwise stable chest with multifocal opacities and moderate right-sided pleural effusion. Electronically Signed   By: Davina Poke D.O.   On: 05/18/2021 15:37   DG Chest Port 1 View  Result Date: 05/18/2021 CLINICAL DATA:  Status post left thoracentesis EXAM: PORTABLE CHEST 1 VIEW COMPARISON:  05/18/2021, 1:54 a.m. FINDINGS: Interval reduction in volume of a previously seen large left pleural effusion, now small and layering, with improved aeration of the left lung. Interval increase in volume of right-sided pleural effusion with increased consolidation  of the right lung. Cardiomegaly. Left axillary and brachiocephalic stents. IMPRESSION: 1. Interval reduction in volume of a previously seen large left pleural effusion, now small and layering, with improved aeration of the left lung. No pneumothorax. 2. Interval increase in volume of right-sided pleural effusion with increased consolidation of the right lung. 3. Cardiomegaly. Electronically Signed   By: Eddie Candle M.D.   On: 05/18/2021 12:51   DG Chest Port 1 View  Result Date: 05/18/2021 CLINICAL DATA:  Shortness of breath. EXAM: PORTABLE CHEST 1 VIEW COMPARISON:  Chest x-ray 05/14/2021, CT chest 05/13/2021 FINDINGS: Redemonstration of vascular stents overlying the mediastinum as well as left axilla. Enlarged cardiac silhouette. The heart size and mediastinal contours are grossly unchanged. Aortic calcification. No focal consolidation. No pulmonary edema. Interval increase in size of a moderate volume left pleural effusion. At least trace right pleural effusion. Patchy interstitial and airspace opacities. No pneumothorax. No acute osseous abnormality. IMPRESSION: 1. Interval increase in size of a moderate volume left pleural effusion. 2. At least trace right pleural effusion. 3. Patchy interstitial and airspace opacities. Findings could represent infection versus inflammation or pulmonary edema. Electronically Signed   By: Iven Finn M.D.   On: 05/18/2021 02:19   US THORACENTESIS ASP PLEURAL SPACE W/IMG GUIDE  Result Date: 05/18/2021 INDICATION: Shortness of breath. Left-sided pleural effusion. Request for diagnostic and therapeutic thoracentesis. EXAM: ULTRASOUND GUIDED LEFT THORACENTESIS MEDICATIONS: 1% plain lidocaine, 5 mL COMPLICATIONS: None immediate. PROCEDURE: An ultrasound guided thoracentesis was thoroughly discussed with the patient and questions answered. The benefits, risks, alternatives and complications were also discussed. The patient understands and wishes to proceed with the  procedure. Written consent was obtained. Ultrasound was performed to localize and mark an adequate pocket of fluid in the left chest. The area was then prepped and draped in the normal sterile fashion. 1% Lidocaine was used for local anesthesia. Under ultrasound guidance a 6 Fr Safe-T-Centesis catheter was introduced. Thoracentesis was performed. The catheter was removed and a dressing applied. FINDINGS: A total of approximately 1 L of clear, dark yellow fluid was removed. Samples were sent to the laboratory as requested by the clinical team. IMPRESSION: Successful ultrasound guided left thoracentesis yielding 1 L of pleural fluid. Read by: Ascencion Dike PA-C Electronically Signed   By: Jacqulynn Cadet M.D.   On: 05/18/2021 12:57     Medications:    [START ON 05/20/2021] azithromycin     [START ON 05/20/2021] cefTRIAXone (ROCEPHIN)  IV      amLODipine  10 mg Oral Daily   arformoterol  15 mcg Nebulization BID   aspirin  81 mg Oral Daily   atorvastatin  80 mg Oral Daily   budesonide (PULMICORT) nebulizer solution  0.25 mg Nebulization BID   carvedilol  25 mg Oral BID   chlorhexidine  15 mL Mouth Rinse BID   Chlorhexidine Gluconate Cloth  6 each Topical Q0600   Chlorhexidine Gluconate Cloth  6 each Topical Q0600   cloNIDine  0.2 mg Oral BID   feeding supplement (NEPRO CARB STEADY)  237 mL Oral TID BM   furosemide  80 mg Oral BID   gabapentin  400 mg Oral QHS   heparin  5,000 Units Subcutaneous Q8H   hydrALAZINE  100 mg Oral Q8H   insulin aspart  0-6 Units Subcutaneous TID WC   ipratropium-albuterol  3 mL Nebulization Q4H   levETIRAcetam  500 mg Oral Daily   mouth rinse  15 mL Mouth Rinse q12n4p   multivitamin  1 tablet Oral QHS   sevelamer carbonate  800 mg Oral TID WC   acetaminophen **OR** acetaminophen  Assessment/ Plan:  [name] is a 60 y.o. black male with end stage renal disease on hemodialysis, diabetes mellitus type II, hypertension, CVA, peripheral vascular disease  status post bilateral BKA and ESRD on dialysis. Patient presented to ED with shortness of breath and cough. He has been admitted to [facility] for SOB (shortness of breath) [R06.02] Pleural effusion [J90] Healthcare-associated pneumonia [J18.9] Acute respiratory failure with hypoxia ([place]) [J96.01] Acute on chronic anemia [D64.9]   [facility]/MWF/LUE AVF  End stage renal disease on dialysis  - Continue hemodialysis schedule, next treatment for tomorrow. Will attempt ultrafiltration on next treatment  2. Acute respiratory failure: requiring oxygen. Status post left thoracentisis.  - Continue supportive care - Stress dose steroids, supplemental oxgyen, nebs - Appreciate pulmonology input.   3. Anemia of chronic kidney disease  Lab Results  Component Value Date   HGB 7.2 (L) 05/19/2021  EPO with HD treatments.    4. Secondary Hyperparathyroidism: with hyperphosphatemia Lab Results  Component Value Date   PTH 283 (H) 01/08/2019   CALCIUM 8.5 (L) 05/19/2021   CAION 1.10 (L) 09/12/2020   PHOS 6.9 (H) 05/19/2021  - Auryxia and sevelamer with meals.   5. Hypertension: 138/63. Current regimen of clonidine, amlodipine, carvedilol, and hydralazine. Currently taking furosmeide 86m PO bid. Patient states he does not make much urine.     LOS: 0 Trisa Cranor 8/2/20229:37 AM

## 2021-05-19 NOTE — Progress Notes (Signed)
PT Cancellation Note  Patient Details Name: [name] MRN: [id number] DOB: [birth year]   Cancelled Treatment:    Reason Eval/Treat Not Completed: Other (comment). Chart reviewed. Pt noted to have rapid response while in HD. Transferred to ICU. Due to change in medical status resulting in transfer to higher level of care, will complete current PT order. Please re-consult at later date/time as pt is medically appropriate for therapy intervention.   Lieutenant Diego PT, DPT 8:13 AM,05/19/21

## 2021-05-19 NOTE — Progress Notes (Signed)
PROGRESS NOTE    [name]  VQQ:[id number] DOB: 05-06-1961 DOA: 05/18/2021 PCP: [name], MD    Brief Narrative:   60 year old male history of ESRD on hemodialysis MWF, hypertension, seizure disorder, anemia, systolic chronic heart failure, hidradenitis on [name], bilateral BKA who presents to the ED for evaluation of worsening shortness of breath.  Patient was recently admitted and discharged on 7/29 for similar complaints.  Now presents with persistent fluid overload, possible pneumonia associated with parapneumonic effusion.  Patient is chronically ill and majority of history is gained by speaking with patient's wife.  Patient's wife states that shortness of breath progressively worsened after discharge.  Patient has no associated chest pain, productive cough, fever, chills.  Initially required 10 L oxygen to maintain saturation greater than 90.  Hemoglobin also dropped to 6.8 from baseline approximate.ly 8.  Started on antibiotics for pneumonia, thoracentesis ordered, nephrology consulted.  8/1: Received page from ICU charge RN.  Patient had rapid response called while in hemodialysis.  I went and evaluated the patient at bedside.  Found him to be on a nonrebreather with saturations in the low to mid 80s.  Work of breathing not especially increased.  Lung sounds very coarse and rhonchorous.  Unable to wean off nonrebreather.  Of note patient did get a thoracentesis with 1 L fluid removed today.  No fluid removal was attempted with hemodialysis.  Patient only completed about 30 minutes of his dialysis session.   Will be transferred to stepdown unit for closer monitoring.  BiPAP initiated.  COPD treatment started.  Case discussed at length with ICU attending.  Consultation greatly appreciated  8/2: Respiratory status much improved.  Patient weaned off BiPAP.  Saturating well on 2 L nasal cannula.  Stable for transfer to medical floor.  Cardiology consult requested and  appreciated.   Assessment & Plan:   Principal Problem:   Acute respiratory failure with hypoxia (HCC) Active Problems:   Hypertension   Anemia due to chronic kidney disease   Crohn disease (HCC)   Hidradenitis suppurativa   Type 2 diabetes mellitus with kidney complication, with long-term current use of insulin (HCC)   End stage renal failure on dialysis [facility])   History of CVA (cerebrovascular accident)   Seizure disorder ([place])   S/P bilateral BKA (below knee amputation) ([place])   Acute on chronic anemia   Chronic systolic CHF (congestive heart failure) ([place])   Long term current use of immunosuppressive drug   Acute decompensated heart failure (HCC)  Acute respiratory failure with hypoxia Initial presentation 4 L Escalated to 10 then weaned back down Imaging significant for bilateral pleural effusions with possible consolidation Started on antibiotics for community-acquired pneumonia Nephrology consulted for dialysis Thoracentesis done 8/1, 1 L fluid removed RRT called during dialysis on 8/1 for worsening hypoxia Transferred to stepdown unit for BiPAP Weaned off BiPAP as of 8/2, saturating well on 2 L Plan: Transfer back to PCU Continue Lasix 80 mg p.o. twice daily Nephrology following for hemodialysis needs Consider right-sided thoracentesis if oxygenation continues to be a problem Regarding PE rule out this is a worthwhile consideration.  At this time patient is saturating well on 2 L.  He is not tachycardic.  He is hemodynamically stable.  No increased work of breathing.  If there is continued clinical concern for PE recommend coordination with nephrology and consideration for CT angiogram.  VQ scan likely not a much utility given his extensive lung disease.  Acute on chronic anemia of chronic kidney  disease Baseline hemoglobin in the eights Presented with hemoglobin 6.8 Transfuse 1 unit PRBC Hemoglobin responded appropriately  Community-acquired pneumonia On CAP  coverage, azithromycin and Rocephin Pulmonology consult requested, recommendations appreciated Patient is immunocompromised on [name] Extensive infectious work-up in progress Dr. Lanney [name] is following  Acute on chronic decompensated systolic and diastolic congestive heart failure Markedly elevated BNP on admission Patient does produce some urine Cardiology consulted 8/2 Plan: Continue Lasix 80 mg p.o. twice daily Continue beta-blocker Continue high intensity statin Volume management with hemodialysis  Essential hypertension On substantial p.o. regimen Improved with hemodialysis Continue current meds and hemodialysis Titrate as necessary to achieve BP goal of 130/80  End-stage renal disease on hemodialysis Nephrology following HD attempted 8/1, only completed 30 minutes then RRT called Plan: HD planned 8/3  Hyperlipidemia PTA statin  Type 2 diabetes mellitus Very sensitive sliding scale, escalate as necessary  Seizure disorder PTA Keppra  History of hidradenitis PTA [name]  Peripheral arterial disease status post bilateral BKA No acute issues    DVT prophylaxis: SQ heparin Code Status: Full code Family Communication: Wife [name] [phone] on 8/2 Disposition Plan: Status is: Inpatient  Remains inpatient appropriate because:Inpatient level of care appropriate due to severity of illness  Dispo: The patient is from: Home              Anticipated d/c is to: Home              Patient currently is not medically stable to d/c.   Difficult to place patient No  Acute hypoxic respiratory failure, multifactorial secondary to ESRD, decompensated heart failure, community-acquired pneumonia, bilateral pleural effusions.  Disposition plan unclear at this time.     Level of care: Progressive Cardiac  Consultants:  Pulmonary Cardiology Nephrology  Procedures:  Thoracentesis 8/1  Antimicrobials:  Ceftriaxone Azithromycin   Subjective: Patient seen and  examined.  Much more awake and alert this morning.  Sitting up in bed eating breakfast.  Work of breathing improved.  Objective: Vitals:   05/19/21 1100 05/19/21 1200 05/19/21 1300 05/19/21 1400  BP: (!) 144/68 (!) 113/58 (!) 116/57 (!) 141/64  Pulse: 66 61 (!) 58   Resp: 14 16 12    Temp:      TempSrc:      SpO2: 100% 100% 100%   Weight:      Height:       No intake or output data in the 24 hours ending 05/19/21 1446 Filed Weights   05/18/21 0716 05/18/21 1507 05/19/21 0414  Weight: 56.7 kg 58.9 kg 56.4 kg    Examination:  General exam: No acute distress.  Appears frail and chronically ill Respiratory system: Bilateral coarse crackles.  Normal work of breathing.  2 L Cardiovascular system: S1-S2, regular rate and rhythm, 2/6 systolic murmur  gastrointestinal system: Thin, nontender, nondistended, normal bowel sounds Central nervous system: Alert and oriented.  No focal deficits Extremities: Bilateral BKA Skin: Scattered excoriations.  No ulcers or induration Psychiatry: Judgement and insight appear normal. Mood & affect appropriate.     Data Reviewed: I have personally reviewed following labs and imaging studies  CBC: Recent Labs  Lab 05/12/21 1928 05/18/21 0143 05/18/21 0547 05/19/21 0405  WBC 6.2 6.1 6.9 6.1  NEUTROABS  --  4.5  --  5.2  HGB 8.2* 6.8* 7.8* 7.2*  HCT 26.8* 22.8* 25.9* 23.5*  MCV 83.0 84.8 83.5 85.1  PLT 154 97* 123* 93*   Basic Metabolic Panel: Recent Labs  Lab 05/12/21 1928 05/15/21 0649 05/18/21 0143  05/18/21 0547 05/19/21 0405  NA 136 136 134*  --  137  K 3.3* 4.0 4.6  --  5.3*  CL 95* 95* 95*  --  97*  CO2 30 28 27   --  26  GLUCOSE 170* 204* 183*  --  200*  BUN 37* 40* 49*  --  60*  CREATININE 5.49* 5.29* 6.30* 6.48* 6.88*  CALCIUM 8.2* 8.8* 8.7*  --  8.5*  PHOS  --   --   --   --  6.9*   GFR: Estimated Creatinine Clearance: 9.3 mL/min (A) (by C-G formula based on SCr of 6.88 mg/dL (H)). Liver Function Tests: No results for  input(s): AST, ALT, ALKPHOS, BILITOT, PROT, ALBUMIN in the last 168 hours. No results for input(s): LIPASE, AMYLASE in the last 168 hours. No results for input(s): AMMONIA in the last 168 hours. Coagulation Profile: No results for input(s): INR, PROTIME in the last 168 hours. Cardiac Enzymes: No results for input(s): CKTOTAL, CKMB, CKMBINDEX, TROPONINI in the last 168 hours. BNP (last 3 results) No results for input(s): PROBNP in the last 8760 hours. HbA1C: Recent Labs    05/18/21 0547  HGBA1C 6.0*   CBG: Recent Labs  Lab 05/18/21 1319 05/18/21 1644 05/18/21 2338 05/19/21 0742 05/19/21 1151  GLUCAP 181* 133* 164* 218* 314*   Lipid Profile: No results for input(s): CHOL, HDL, LDLCALC, TRIG, CHOLHDL, LDLDIRECT in the last 72 hours. Thyroid Function Tests: No results for input(s): TSH, T4TOTAL, FREET4, T3FREE, THYROIDAB in the last 72 hours. Anemia Panel: No results for input(s): VITAMINB12, FOLATE, FERRITIN, TIBC, IRON, RETICCTPCT in the last 72 hours. Sepsis Labs: Recent Labs  Lab 05/13/21 1700 05/18/21 0143  PROCALCITON 0.75 1.09    Recent Results (from the past 240 hour(s))  Resp Panel by RT-PCR (Flu A&B, Covid) Nasopharyngeal Swab     Status: None   Collection Time: 05/12/21  9:03 PM   Specimen: Nasopharyngeal Swab; Nasopharyngeal(NP) swabs in vial transport medium  Result Value Ref Range Status   SARS Coronavirus 2 by RT PCR NEGATIVE NEGATIVE Final    Comment: (NOTE) SARS-CoV-2 target nucleic acids are NOT DETECTED.  The SARS-CoV-2 RNA is generally detectable in upper respiratory specimens during the acute phase of infection. The lowest concentration of SARS-CoV-2 viral copies this assay can detect is 138 copies/mL. A negative result does not preclude SARS-Cov-2 infection and should not be used as the sole basis for treatment or other patient management decisions. A negative result may occur with  improper specimen collection/handling, submission of specimen  other than nasopharyngeal swab, presence of viral mutation(s) within the areas targeted by this assay, and inadequate number of viral copies(<138 copies/mL). A negative result must be combined with clinical observations, patient history, and epidemiological information. The expected result is Negative.  Fact Sheet for Patients:  EntrepreneurPulse.com.au  Fact Sheet for Healthcare Providers:  IncredibleEmployment.be  This test is no t yet approved or cleared by the [place] FDA and  has been authorized for detection and/or diagnosis of SARS-CoV-2 by FDA under an Emergency Use Authorization (EUA). This EUA will remain  in effect (meaning this test can be used) for the duration of the COVID-19 declaration under Section 564(b)(1) of the Act, 21 U.S.C.section 360bbb-3(b)(1), unless the authorization is terminated  or revoked sooner.       Influenza A by PCR NEGATIVE NEGATIVE Final   Influenza B by PCR NEGATIVE NEGATIVE Final    Comment: (NOTE) The Xpert Xpress SARS-CoV-2/FLU/RSV plus assay is intended as an  aid in the diagnosis of influenza from Nasopharyngeal swab specimens and should not be used as a sole basis for treatment. Nasal washings and aspirates are unacceptable for Xpert Xpress SARS-CoV-2/FLU/RSV testing.  Fact Sheet for Patients: EntrepreneurPulse.com.au  Fact Sheet for Healthcare Providers: IncredibleEmployment.be  This test is not yet approved or cleared by the [place] FDA and has been authorized for detection and/or diagnosis of SARS-CoV-2 by FDA under an Emergency Use Authorization (EUA). This EUA will remain in effect (meaning this test can be used) for the duration of the COVID-19 declaration under Section 564(b)(1) of the Act, 21 U.S.C. section 360bbb-3(b)(1), unless the authorization is terminated or revoked.  Performed at [facility], [address]  [place], [place] [address]   MRSA Next Gen by PCR, Nasal     Status: None   Collection Time: 05/12/21 11:06 PM   Specimen: Nasal Mucosa; Nasal Swab  Result Value Ref Range Status   MRSA by PCR Next Gen NOT DETECTED NOT DETECTED Final    Comment: (NOTE) The GeneXpert MRSA Assay (FDA approved for NASAL specimens only), is one component of a comprehensive MRSA colonization surveillance program. It is not intended to diagnose MRSA infection nor to guide or monitor treatment for MRSA infections. Test performance is not FDA approved in patients less than 74 years old. Performed at [facility], [address] [place], [place] [address]   Culture, blood (routine x 2) Call MD if unable to obtain prior to antibiotics being given     Status: None   Collection Time: 05/12/21 11:54 PM   Specimen: BLOOD  Result Value Ref Range Status   Specimen Description BLOOD RIGHT FOREARM  Final   Special Requests   Final    BOTTLES DRAWN AEROBIC ONLY Blood Culture results may not be optimal due to an inadequate volume of blood received in culture bottles   Culture   Final    NO GROWTH 5 DAYS Performed at [facility], [address] [place], [place] [address]    Report Status 05/18/2021 FINAL  Final  Resp Panel by RT-PCR (Flu A&B, Covid) Nasopharyngeal Swab     Status: None   Collection Time: 05/18/21  1:43 AM   Specimen: Nasopharyngeal Swab; Nasopharyngeal(NP) swabs in vial transport medium  Result Value Ref Range Status   SARS Coronavirus 2 by RT PCR NEGATIVE NEGATIVE Final    Comment: (NOTE) SARS-CoV-2 target nucleic acids are NOT DETECTED.  The SARS-CoV-2 RNA is generally detectable in upper respiratory specimens during the acute phase of infection. The lowest concentration of SARS-CoV-2 viral copies this assay can detect is 138 copies/mL. A negative result does not preclude SARS-Cov-2 infection and should not be used as the sole basis for treatment or other patient management  decisions. A negative result may occur with  improper specimen collection/handling, submission of specimen other than nasopharyngeal swab, presence of viral mutation(s) within the areas targeted by this assay, and inadequate number of viral copies(<138 copies/mL). A negative result must be combined with clinical observations, patient history, and epidemiological information. The expected result is Negative.  Fact Sheet for Patients:  EntrepreneurPulse.com.au  Fact Sheet for Healthcare Providers:  IncredibleEmployment.be  This test is no t yet approved or cleared by the [place] FDA and  has been authorized for detection and/or diagnosis of SARS-CoV-2 by FDA under an Emergency Use Authorization (EUA). This EUA will remain  in effect (meaning this test can be used) for the duration of the COVID-19 declaration under  Section 564(b)(1) of the Act, 21 U.S.C.section 360bbb-3(b)(1), unless the authorization is terminated  or revoked sooner.       Influenza A by PCR NEGATIVE NEGATIVE Final   Influenza B by PCR NEGATIVE NEGATIVE Final    Comment: (NOTE) The Xpert Xpress SARS-CoV-2/FLU/RSV plus assay is intended as an aid in the diagnosis of influenza from Nasopharyngeal swab specimens and should not be used as a sole basis for treatment. Nasal washings and aspirates are unacceptable for Xpert Xpress SARS-CoV-2/FLU/RSV testing.  Fact Sheet for Patients: EntrepreneurPulse.com.au  Fact Sheet for Healthcare Providers: IncredibleEmployment.be  This test is not yet approved or cleared by the [place] FDA and has been authorized for detection and/or diagnosis of SARS-CoV-2 by FDA under an Emergency Use Authorization (EUA). This EUA will remain in effect (meaning this test can be used) for the duration of the COVID-19 declaration under Section 564(b)(1) of the Act, 21 U.S.C. section 360bbb-3(b)(1), unless the  authorization is terminated or revoked.  Performed at [facility], [address] [place], [place] [address]   Blood culture (routine x 2)     Status: None (Preliminary result)   Collection Time: 05/18/21  1:44 AM   Specimen: BLOOD  Result Value Ref Range Status   Specimen Description BLOOD RIGHT ANTECUBITAL  Final   Special Requests   Final    BOTTLES DRAWN AEROBIC AND ANAEROBIC Blood Culture adequate volume   Culture   Final    NO GROWTH 1 DAY Performed at [facility], [address], [place], [place] [address]    Report Status PENDING  Incomplete  Blood culture (routine x 2)     Status: None (Preliminary result)   Collection Time: 05/18/21  1:44 AM   Specimen: BLOOD  Result Value Ref Range Status   Specimen Description BLOOD BLOOD RIGHT FOREARM  Final   Special Requests   Final    BOTTLES DRAWN AEROBIC AND ANAEROBIC Blood Culture adequate volume   Culture   Final    NO GROWTH 1 DAY Performed at [facility], [address] [place], [place] [address]    Report Status PENDING  Incomplete  Body fluid culture w Gram Stain     Status: None (Preliminary result)   Collection Time: 05/18/21 11:25 AM   Specimen: PATH Cytology Pleural fluid  Result Value Ref Range Status   Specimen Description   Final    PLEURAL Performed at [facility], [address], [place], [place] [address]    Special Requests   Final    NONE Performed at [facility], [address] [place], [place] [address]    Gram Stain   Final    RARE WBC PRESENT, PREDOMINANTLY MONONUCLEAR NO ORGANISMS SEEN    Culture   Final    NO GROWTH < 24 HOURS Performed at [facility], [place] [address] [place], [place] [address]    Report Status PENDING  Incomplete         Radiology Studies: CT CHEST WO CONTRAST  Result Date: 05/18/2021 CLINICAL DATA:  60 year old male with history of chest pain or shortness of breath. EXAM: CT CHEST WITHOUT CONTRAST  TECHNIQUE: Multidetector CT imaging of the chest was performed following the standard protocol without IV contrast. COMPARISON:  Chest CT 05/13/2021. FINDINGS: Cardiovascular: Heart size is enlarged. There is no significant pericardial fluid, thickening or pericardial calcification. There is aortic atherosclerosis, as well as atherosclerosis of the great vessels of the mediastinum and the coronary arteries,  including calcified atherosclerotic plaque in the left main, left anterior descending, left circumflex and right coronary arteries. Calcifications of the aortic valve and mitral annulus. Vascular stent in the left innominate vein. Mediastinum/Nodes: Assessment for lymphadenopathy is exceedingly limited on today's noncontrast CT examination, particularly in light of the patient's thin body habitus. Thickening of the esophagus most notably in the mid esophagus (axial image 41 of series 2), poorly evaluated on today's noncontrast CT examination. No axillary lymphadenopathy. Lungs/Pleura: Right-sided pleural calcification. No left pleural calcification identified. Small right and moderate left pleural effusions. The majority of the left pleural effusion is sub pulmonic in distribution. There continues to be generalized mild ground-glass attenuation and interlobular septal thickening in the lungs bilaterally suggesting a background of mild interstitial pulmonary edema. Compared to the prior study, the left lower lobe is now opacified with only a small amount of volume loss, indicating predominantly airspace consolidation rather than simple atelectasis. Upper Abdomen: Aortic atherosclerosis. Musculoskeletal: There are no aggressive appearing lytic or blastic lesions noted in the visualized portions of the skeleton. IMPRESSION: 1. There continues to be evidence of congestive heart failure, as above. However, today's study also demonstrates new airspace consolidation in the left lower lobe concerning for pneumonia. 2.  Small right and moderate left pleural effusions. Left pleural effusion has increased substantially in size and is predominantly sub pulmonic in location, potentially loculated. 3. Aortic atherosclerosis, in addition to left main and 3 vessel coronary artery disease. Please note that although the presence of coronary artery calcium documents the presence of coronary artery disease, the severity of this disease and any potential stenosis cannot be assessed on this non-gated CT examination. Assessment for potential risk factor modification, dietary therapy or pharmacologic therapy may be warranted, if clinically indicated. 4. There are calcifications of the aortic valve and mitral annulus. Echocardiographic correlation for evaluation of potential valvular dysfunction may be warranted if clinically indicated. Aortic Atherosclerosis (ICD10-I70.0). Electronically Signed   By: Vinnie Langton M.D.   On: 05/18/2021 05:33   DG Chest Port 1 View  Result Date: 05/18/2021 CLINICAL DATA:  Respiratory distress. Recent left-sided thoracentesis. EXAM: PORTABLE CHEST 1 VIEW COMPARISON:  05/18/2021 FINDINGS: Portable semi upright view of the chest was obtained. Grossly stable cardiomediastinal contours. Aortic atherosclerosis. Moderate-sized right-sided pleural effusion with extensive airspace opacities within the right lung. Diffuse interstitial opacities throughout the left lung with slight improved aeration of the left lung base. No pneumothorax. Vascular stents again seen in the left axillary and brachiocephalic regions. IMPRESSION: 1. Slight improved aeration of the left lung base post thoracentesis. No pneumothorax. 2. Otherwise stable chest with multifocal opacities and moderate right-sided pleural effusion. Electronically Signed   By: Davina Poke D.O.   On: 05/18/2021 15:37   DG Chest Port 1 View  Result Date: 05/18/2021 CLINICAL DATA:  Status post left thoracentesis EXAM: PORTABLE CHEST 1 VIEW COMPARISON:   05/18/2021, 1:54 a.m. FINDINGS: Interval reduction in volume of a previously seen large left pleural effusion, now small and layering, with improved aeration of the left lung. Interval increase in volume of right-sided pleural effusion with increased consolidation of the right lung. Cardiomegaly. Left axillary and brachiocephalic stents. IMPRESSION: 1. Interval reduction in volume of a previously seen large left pleural effusion, now small and layering, with improved aeration of the left lung. No pneumothorax. 2. Interval increase in volume of right-sided pleural effusion with increased consolidation of the right lung. 3. Cardiomegaly. Electronically Signed   By: Eddie Candle M.D.   On: 05/18/2021 12:51  DG Chest Port 1 View  Result Date: 05/18/2021 CLINICAL DATA:  Shortness of breath. EXAM: PORTABLE CHEST 1 VIEW COMPARISON:  Chest x-ray 05/14/2021, CT chest 05/13/2021 FINDINGS: Redemonstration of vascular stents overlying the mediastinum as well as left axilla. Enlarged cardiac silhouette. The heart size and mediastinal contours are grossly unchanged. Aortic calcification. No focal consolidation. No pulmonary edema. Interval increase in size of a moderate volume left pleural effusion. At least trace right pleural effusion. Patchy interstitial and airspace opacities. No pneumothorax. No acute osseous abnormality. IMPRESSION: 1. Interval increase in size of a moderate volume left pleural effusion. 2. At least trace right pleural effusion. 3. Patchy interstitial and airspace opacities. Findings could represent infection versus inflammation or pulmonary edema. Electronically Signed   By: Iven Finn M.D.   On: 05/18/2021 02:19   US THORACENTESIS ASP PLEURAL SPACE W/IMG GUIDE  Result Date: 05/18/2021 INDICATION: Shortness of breath. Left-sided pleural effusion. Request for diagnostic and therapeutic thoracentesis. EXAM: ULTRASOUND GUIDED LEFT THORACENTESIS MEDICATIONS: 1% plain lidocaine, 5 mL COMPLICATIONS:  None immediate. PROCEDURE: An ultrasound guided thoracentesis was thoroughly discussed with the patient and questions answered. The benefits, risks, alternatives and complications were also discussed. The patient understands and wishes to proceed with the procedure. Written consent was obtained. Ultrasound was performed to localize and mark an adequate pocket of fluid in the left chest. The area was then prepped and draped in the normal sterile fashion. 1% Lidocaine was used for local anesthesia. Under ultrasound guidance a 6 Fr Safe-T-Centesis catheter was introduced. Thoracentesis was performed. The catheter was removed and a dressing applied. FINDINGS: A total of approximately 1 L of clear, dark yellow fluid was removed. Samples were sent to the laboratory as requested by the clinical team. IMPRESSION: Successful ultrasound guided left thoracentesis yielding 1 L of pleural fluid. Read by: Ascencion Dike PA-C Electronically Signed   By: Jacqulynn Cadet M.D.   On: 05/18/2021 12:57        Scheduled Meds:  amLODipine  10 mg Oral Daily   arformoterol  15 mcg Nebulization BID   vitamin C  500 mg Oral BID   aspirin  81 mg Oral Daily   atorvastatin  80 mg Oral Daily   budesonide (PULMICORT) nebulizer solution  0.25 mg Nebulization BID   carvedilol  25 mg Oral BID   chlorhexidine  15 mL Mouth Rinse BID   Chlorhexidine Gluconate Cloth  6 each Topical Q0600   Chlorhexidine Gluconate Cloth  6 each Topical Q0600   cloNIDine  0.2 mg Oral BID   [START ON 05/20/2021] epoetin (EPOGEN/PROCRIT) injection  10,000 Units Intravenous Q M,W,F-HD   feeding supplement (NEPRO CARB STEADY)  237 mL Oral TID BM   ferric citrate  420 mg Oral TID WC   furosemide  80 mg Oral BID   gabapentin  400 mg Oral QHS   heparin  5,000 Units Subcutaneous Q8H   hydrALAZINE  100 mg Oral Q8H   insulin aspart  0-6 Units Subcutaneous TID WC   ipratropium-albuterol  3 mL Nebulization Q4H   levETIRAcetam  500 mg Oral Daily   mouth rinse   15 mL Mouth Rinse q12n4p   multivitamin  1 tablet Oral QHS   sevelamer carbonate  1,600 mg Oral TID WC   Continuous Infusions:  [START ON 05/20/2021] azithromycin     [START ON 05/20/2021] cefTRIAXone (ROCEPHIN)  IV       LOS: 0 days    Time spent: 35 minutes    Gopal Malter B Priscella Mann,  MD [facility] Pager [phone]  If 7PM-7AM, please contact night-coverage 05/19/2021, 2:46 PM

## 2021-05-19 NOTE — Consult Note (Addendum)
Cardiology Consultation:   Patient ID: [name] MRN: [id number]; DOB: January 24, 1961  Admit date: 05/18/2021 Date of Consult: 05/19/2021  PCP:  [name], MD   [place]  Cardiologist:  [facility], Dr. End Advanced Practice Provider:  No care team member to display Electrophysiologist:  None }    Patient Profile:   [name] is a 60 y.o. male with a hx of ESRD on hemodialysis MWF, anemia of chronic disease, hypertension, hyperlipidemia, seizures, hidradenitis, vascular disease, s/p bilateral BKA documented history of DVT, h/o ICH/stroke, DM2, current marijuana use, and no previously known history of heart failure who is being seen today for the evaluation of acute systolic heart failure at the request of Dr. Priscella Mann.  History of Present Illness:   [name] is a 60 year old male with PMH as above.  He denies any family or personal history of heart disease or arrhythmia.  No family history of early cardiac death.  He reports a longstanding history of marijuana use.  No other illegal drug use.  He denies tobacco use and alcohol use.  He was admitted 7/27-7/29 after weakness and a fall at home.  He noted intermittent shortness of breath and chest pain/cough over the last few days leading up to his admission.  Chest x-ray showed edema versus possible consolidation.  He was found to have elevated BNP greater than 4500.  He was italicized twice and euvolemic before discharge.  He was treated with antibiotics.  Antihypertensives were adjusted due to elevated BP.  Echo was performed 05/13/2021 and showed EF 30 to 35%, LV global hypokinesis, mild LVE, mild LVH, G2 DD, elevated LAP, moderately reduced RV SF, moderate LVE, severe LAE, large left pleural effusion, mild to moderate MR, moderate mitral annular calcification, moderate TR, moderate calcification of the aortic valve, mild to moderate aortic valve sclerosis without stenosis, RAP 8 mmHg.  Cardiology was  not consulted for this echo during his 7/27-7/29 admission.  He presented to [facility]18[facility]2021 with shortness of breath that persisted after discharge from the hospital 7/29.  No CP, tachypalpitations, presyncope, or syncope. No asymmetric edema or bilateral edema of his amputated lower extremities.  He reports only phantom leg pain s/p bilateral BKA.  No abdominal distention or early satiety.  He does report a sedentary lifestyle.  He is compliant with HD. Per patient and family today, they do not feel that his shortness of breath was treated during his previous admission. Leading up to admission, he had severe orthopnea, requiring at least a 90 degree angle, at which point he was still short of breath.  He reported cough with clear sputum.  When EMS arrived, O2 had dropped to the 80s and he was placed on a nonrebreather at 15 L with improvement in symptoms.   In the ED, hemoglobin 6.8, hematocrit 22.8, sodium 134, glucose 183, creatinine 6.3, BUN 49, calcium 8.7, BNP greater than 4500.  High-sensitivity troponin 34.  Sinus rhythm, 68 bpm, T wave inversion in the lateral leads, prolonged QTC at 520 ms, LVH with repolarization abnormalities. Chest x-ray showed increase in size of moderate left pleural effusion, trace right pleural effusion, and patchy interstitial airspace opacities.  Findings were thought consistent with inflammation or pulmonary edema.  In the emergency department, he was weaned down to 4 L nasal cannula with saturations in the 90s.   Since admission, he reports improved breathing, orthopnea, and cough.  He denies any chest pain now or leading up to admission.  No tachypalpitations, presyncope, or syncope.    Past Medical History:  Diagnosis Date   Acute metabolic encephalopathy 02/19/6567   Anemia    Crohn disease ([place])    Diabetes mellitus without complication ([place])    DVT of lower extremity (deep venous thrombosis) ([place]) 2016   Empyema ([place]) 05/20/2017   Encephalopathy 12/04/2017   Fall  at home, initial encounter 09/12/2020   Hidradenitis suppurativa    Hypertension    ICH (intracerebral hemorrhage) ([place])    Peritonitis ([place]) 04/21/2017   Pyogenic arthritis of knee ([place]) 02/04/2016   Renal disorder    Sepsis ([place]) 01/12/2018   Stroke [facility])     Past Surgical History:  Procedure Laterality Date   A/V FISTULAGRAM Left 02/24/2021   Procedure: A/V FISTULAGRAM;  Surgeon: Katha Cabal, MD;  Location: [place] [facility];  Service: Cardiovascular;  Laterality: Left;   ABDOMINAL SURGERY     AMPUTATION FINGER Left 06/2019   PR AMPUTATION LONG FINGER/THUMB+FLAPS [facility]   ANGIOPLASTY Left    left fem-pop at [facility] 04-11-2018   BELOW KNEE LEG AMPUTATION Right 08/2017   [facility]   COLONOSCOPY     COLONOSCOPY WITH PROPOFOL N/A 10/28/2020   Procedure: COLONOSCOPY WITH PROPOFOL;  Surgeon: Lin Landsman, MD;  Location: [place];  Service: Gastroenterology;  Laterality: N/A;   COLONOSCOPY WITH PROPOFOL N/A 11/21/2020   Procedure: COLONOSCOPY WITH PROPOFOL;  Surgeon: [name], MD;  Location: [facility] ENDOSCOPY;  Service: Endoscopy;  Laterality: N/A;   DIALYSIS/PERMA CATHETER INSERTION N/A 12/09/2017   Procedure: DIALYSIS/PERMA CATHETER INSERTION;  Surgeon: Katha Cabal, MD;  Location: [place] [facility];  Service: Cardiovascular;  Laterality: N/A;   DIALYSIS/PERMA CATHETER INSERTION N/A 12/12/2017   Procedure: DIALYSIS/PERMA CATHETER INSERTION;  Surgeon: Algernon Huxley, MD;  Location: [place] [facility];  Service: Cardiovascular;  Laterality: N/A;   DIALYSIS/PERMA CATHETER REMOVAL Left 12/09/2017   Procedure: DIALYSIS/PERMA CATHETER REMOVAL;  Surgeon: Katha Cabal, MD;  Location: [place] [facility];  Service: Cardiovascular;  Laterality: Left;   ESOPHAGOGASTRODUODENOSCOPY (EGD) WITH PROPOFOL N/A 11/20/2020   Procedure: ESOPHAGOGASTRODUODENOSCOPY (EGD) WITH PROPOFOL;  Surgeon: [name], MD;  Location: [facility] ENDOSCOPY;  Service: Endoscopy;  Laterality: N/A;   KNEE SURGERY  Left 02/04/2016   [facility]   LEG AMPUTATION THROUGH LOWER TIBIA AND FIBULA Left 06/22/2018   [facility]   LOWER EXTREMITY ANGIOGRAPHY Right 08/08/2017   Procedure: Lower Extremity Angiography;  Surgeon: Algernon Huxley, MD;  Location: [place] [facility];  Service: Cardiovascular;  Laterality: Right;   LOWER EXTREMITY ANGIOGRAPHY Right 08/22/2017   Procedure: Lower Extremity Angiography;  Surgeon: Algernon Huxley, MD;  Location: [place] [facility];  Service: Cardiovascular;  Laterality: Right;   LOWER EXTREMITY INTERVENTION  08/08/2017   Procedure: LOWER EXTREMITY INTERVENTION;  Surgeon: Algernon Huxley, MD;  Location: [place] [facility];  Service: Cardiovascular;;   LOWER EXTREMITY INTERVENTION  08/22/2017   Procedure: LOWER EXTREMITY INTERVENTION;  Surgeon: Algernon Huxley, MD;  Location: [place] [facility];  Service: Cardiovascular;;     Home Medications:  Prior to Admission medications   Medication Sig Start Date End Date Taking? Authorizing Provider  acetaminophen (TYLENOL) 500 MG tablet Take 1,000 mg by mouth daily as needed for moderate pain or headache.    Yes [provider]  Alcohol Swabs PADS Use as directed to check blood sugar three times daily for insulin dependent type 2 diabetes. 10/20/17  Yes [name], MD  amLODipine (NORVASC) 10 MG tablet  TAKE 1 TABLET(10 MG) BY MOUTH DAILY AS NEEDED Patient taking differently: Take 10 mg by mouth daily. 03/02/21  Yes [name], MD  aspirin 81 MG chewable tablet Chew 81 mg by mouth daily.   Yes [provider]  atorvastatin (LIPITOR) 80 MG tablet TAKE 1 TABLET(80 MG) BY MOUTH DAILY 06/25/20  Yes [name], MD  AURYXIA 1 GM 210 MG(Fe) tablet Take 420 mg by mouth in the morning and at bedtime.  10/23/18  Yes [provider]  Blood Glucose Monitoring Suppl (ONE TOUCH ULTRA 2) w/Device KIT Use as directed to check blood sugar three times daily. E11.9 02/20/18  Yes [name], MD  carvedilol (COREG) 25 MG tablet Take 1  tablet (25 mg total) by mouth 2 (two) times daily. 06/27/20  Yes [name], MD  cloNIDine (CATAPRES) 0.2 MG tablet Take 1 tablet (0.2 mg total) by mouth 2 (two) times daily. 05/15/21  Yes Lorella Nimrod, MD  feeding supplement (ENSURE ENLIVE / ENSURE PLUS) LIQD Take 237 mLs by mouth 3 (three) times daily between meals. 05/15/21  Yes Lorella Nimrod, MD  furosemide (LASIX) 80 MG tablet Take 1 tablet (80 mg total) by mouth 2 (two) times daily. 03/10/21  Yes [name], MD  gabapentin (NEURONTIN) 100 MG capsule Take 100 mg by mouth at bedtime. Take along with 369m capsule at bedtime   Yes [provider]  hydrALAZINE (APRESOLINE) 100 MG tablet Take 1 tablet (100 mg total) by mouth every 8 (eight) hours. 05/15/21  Yes ALorella Nimrod MD  levETIRAcetam (KEPPRA) 500 MG tablet Take 1 tablet (500 mg total) by mouth daily. 05/16/21  Yes ALorella Nimrod MD  lisinopril (ZESTRIL) 40 MG tablet Take 40 mg by mouth daily. 12/17/20  Yes [provider]  multivitamin (RENA-VIT) TABS tablet Take 1 tablet by mouth at bedtime. 05/15/21  Yes ALorella Nimrod MD  sevelamer carbonate (RENVELA) 800 MG tablet TAKE 1 TABLET(800 MG) BY MOUTH THREE TIMES DAILY 09/22/20  Yes FBirdie Sons MD  spironolactone (ALDACTONE) 25 MG tablet Take 1 tablet by mouth at bedtime. 12/17/20 12/17/21 Yes [provider]  Adalimumab ([name] PEN) 40 MG/0.4ML PNKT Inject 40 mg into the muscle once a week.  11/27/18   [provider]    Inpatient Medications: Scheduled Meds:  amLODipine  10 mg Oral Daily   arformoterol  15 mcg Nebulization BID   aspirin  81 mg Oral Daily   atorvastatin  80 mg Oral Daily   budesonide (PULMICORT) nebulizer solution  0.25 mg Nebulization BID   carvedilol  25 mg Oral BID   chlorhexidine  15 mL Mouth Rinse BID   Chlorhexidine Gluconate Cloth  6 each Topical Q0600   Chlorhexidine Gluconate Cloth  6 each Topical Q0600   cloNIDine  0.2 mg Oral BID   [START ON 05/20/2021] epoetin  (EPOGEN/PROCRIT) injection  10,000 Units Intravenous Q M,W,F-HD   feeding supplement (NEPRO CARB STEADY)  237 mL Oral TID BM   ferric citrate  420 mg Oral TID WC   furosemide  80 mg Oral BID   gabapentin  400 mg Oral QHS   heparin  5,000 Units Subcutaneous Q8H   hydrALAZINE  100 mg Oral Q8H   insulin aspart  0-6 Units Subcutaneous TID WC   ipratropium-albuterol  3 mL Nebulization Q4H   levETIRAcetam  500 mg Oral Daily   mouth rinse  15 mL Mouth Rinse q12n4p   multivitamin  1 tablet Oral QHS  sevelamer carbonate  1,600 mg Oral TID WC   Continuous Infusions:  [START ON 05/20/2021] azithromycin     [START ON 05/20/2021] cefTRIAXone (ROCEPHIN)  IV     PRN Meds: acetaminophen **OR** acetaminophen  Allergies:    Allergies  Allergen Reactions   Methotrexate Other (See Comments)    Blood count drops   Vancomycin Shortness Of Breath    Eyes watering, SOB, wheezing   Cefepime Other (See Comments)   Tape     Social History:   Social History   Socioeconomic History   Marital status: Married    Spouse name: Not on file   Number of children: 2   Years of education: Not on file   Highest education level: Associate degree: occupational, Hotel manager, or vocational program  Occupational History   Occupation: disable  Tobacco Use   Smoking status: Former   Smokeless tobacco: Never   Tobacco comments:    smokes marijuana  Vaping Use   Vaping Use: Never used  Substance and Sexual Activity   Alcohol use: No   Drug use: Yes    Types: Marijuana   Sexual activity: Not Currently  Other Topics Concern   Not on file  Social History Narrative   Not on file   Social Determinants of Health   Financial Resource Strain: Low Risk    Difficulty of Paying Living Expenses: Not hard at all  Food Insecurity: No Food Insecurity   Worried About Charity fundraiser in the Last Year: Never true   [place] in the Last Year: Never true  Transportation Needs: No Transportation Needs   Lack of  Transportation (Medical): No   Lack of Transportation (Non-Medical): No  Physical Activity: Inactive   Days of Exercise per Week: 0 days   Minutes of Exercise per Session: 0 min  Stress: No Stress Concern Present   Feeling of Stress : Only a little  Social Connections: Moderately Isolated   Frequency of Communication with Friends and Family: Twice a week   Frequency of Social Gatherings with Friends and Family: Three times a week   Attends Religious Services: Never   Active Member of Clubs or Organizations: No   Attends Music therapist: Never   Marital Status: Married  Human resources officer Violence: Not At Risk   Fear of Current or Ex-Partner: No   Emotionally Abused: No   Physically Abused: No   Sexually Abused: No    Family History:    Family History  Problem Relation Age of Onset   Irritable bowel syndrome Sister    Diabetes Sister    Heart disease Mother    Diabetes Mother    Heart disease Father    Rheumatic fever Father        as child   Psoriasis Brother    Arthritis Brother    Diabetes Sister    Diabetes Sister      ROS:  Please see the history of present illness.  Review of Systems  Constitutional:  Positive for malaise/fatigue.  Respiratory:  Positive for cough, sputum production, shortness of breath and wheezing.   Cardiovascular:  Positive for orthopnea and PND. Negative for chest pain, palpitations and leg swelling.  Musculoskeletal:  Negative for falls.       Phantom leg pain after amputations.  Neurological:  Negative for dizziness and loss of consciousness.  All other systems reviewed and are negative.  All other ROS reviewed and negative.     Physical Exam/Data:  Vitals:   05/18/21 2300 05/18/21 2341 05/19/21 0414 05/19/21 0800  BP: (!) 145/69   138/63  Pulse: 71     Resp: 15     Temp:  98.5 F (36.9 C) 97.6 F (36.4 C)   TempSrc:  Axillary Axillary   SpO2: 100%     Weight:   56.4 kg   Height:        Intake/Output Summary  (Last 24 hours) at 05/19/2021 1122 Last data filed at 05/18/2021 1430 Gross per 24 hour  Intake --  Output -419 ml  Net 419 ml   Last 3 Weights 05/19/2021 05/18/2021 05/18/2021  Weight (lbs) 124 lb 5.4 oz 129 lb 13.6 oz 125 lb  Weight (kg) 56.4 kg 58.9 kg 56.7 kg     Body mass index is 17.34 kg/m.  General: Thin /cachectic male, appears older than stated age 28: normal Lymph: no adenopathy Neck:+JVD Vascular: No carotid bruits; L HD site with strong bruit. 1+ right radial pulse Cardiac:  normal S1, S2; RRR; 1/6 systolic murmur appreciated throughout exam Lungs: Bilateral reduced breath sounds, worse at the bases Abd: soft, nontender, no hepatomegaly  Ext: no edema of bilateral amputated lower extremities s/p BKA Musculoskeletal: S/p BKA Skin: warm and dry  Neuro:  CNs 2-12 intact, no focal abnormalities noted Psych:  Normal affect   EKG:  The EKG was personally reviewed and demonstrates:  Sinus rhythm, 68 bpm, T wave inversion in the lateral leads, prolonged QTC at 520 ms, LVH with repolarization abnormalities Telemetry:  Telemetry was personally reviewed and demonstrates: NSR  Relevant CV Studies: Echo 05/13/21  1. Left ventricular ejection fraction, by estimation, is 30 to 35%. The  left ventricle has moderately decreased function. The left ventricle  demonstrates global hypokinesis. The left ventricular internal cavity size  was mildly dilated. There is mild  left ventricular hypertrophy. Left ventricular diastolic parameters are  consistent with Grade II diastolic dysfunction (pseudonormalization).  Elevated left atrial pressure.   2. Right ventricular systolic function is moderately reduced. The right  ventricular size is moderately enlarged. There is normal pulmonary artery  systolic pressure.   3. Left atrial size was severely dilated.   4. Large pleural effusion in the left lateral region.   5. The mitral valve is abnormal. Mild to moderate mitral valve  regurgitation.  No evidence of mitral stenosis. Moderate mitral annular  calcification.   6. Tricuspid valve regurgitation is moderate.   7. The aortic valve is tricuspid. There is moderate calcification of the  aortic valve. There is moderate thickening of the aortic valve. Aortic  valve regurgitation is not visualized. Mild to moderate aortic valve  sclerosis/calcification is present,  without any evidence of aortic stenosis.   8. The inferior vena cava is normal in size with <50% respiratory  variability, suggesting right atrial pressure of 8 mmHg.   Laboratory Data:  High Sensitivity Troponin:   Recent Labs  Lab 05/12/21 1928 05/12/21 2103 05/18/21 0143 05/18/21 0336  TROPONINIHS 32* 33* 34* 30*     Chemistry Recent Labs  Lab 05/15/21 0649 05/18/21 0143 05/18/21 0547 05/19/21 0405  NA 136 134*  --  137  K 4.0 4.6  --  5.3*  CL 95* 95*  --  97*  CO2 28 27  --  26  GLUCOSE 204* 183*  --  200*  BUN 40* 49*  --  60*  CREATININE 5.29* 6.30* 6.48* 6.88*  CALCIUM 8.8* 8.7*  --  8.5*  GFRNONAA 12* 10* 9*  9*  ANIONGAP 13 12  --  14    No results for input(s): PROT, ALBUMIN, AST, ALT, ALKPHOS, BILITOT in the last 168 hours. Hematology Recent Labs  Lab 05/18/21 0143 05/18/21 0547 05/19/21 0405  WBC 6.1 6.9 6.1  RBC 2.69* 3.10* 2.76*  HGB 6.8* 7.8* 7.2*  HCT 22.8* 25.9* 23.5*  MCV 84.8 83.5 85.1  MCH 25.3* 25.2* 26.1  MCHC 29.8* 30.1 30.6  RDW 20.5* 20.5* 20.3*  PLT 97* 123* 93*   BNP Recent Labs  Lab 05/12/21 1928 05/18/21 0143  BNP >4,500.0* >4,500.0*    DDimer No results for input(s): DDIMER in the last 168 hours.   Radiology/Studies:  CT CHEST WO CONTRAST  Result Date: 05/18/2021 CLINICAL DATA:  60 year old male with history of chest pain or shortness of breath. EXAM: CT CHEST WITHOUT CONTRAST TECHNIQUE: Multidetector CT imaging of the chest was performed following the standard protocol without IV contrast. COMPARISON:  Chest CT 05/13/2021. FINDINGS: Cardiovascular:  Heart size is enlarged. There is no significant pericardial fluid, thickening or pericardial calcification. There is aortic atherosclerosis, as well as atherosclerosis of the great vessels of the mediastinum and the coronary arteries, including calcified atherosclerotic plaque in the left main, left anterior descending, left circumflex and right coronary arteries. Calcifications of the aortic valve and mitral annulus. Vascular stent in the left innominate vein. Mediastinum/Nodes: Assessment for lymphadenopathy is exceedingly limited on today's noncontrast CT examination, particularly in light of the patient's thin body habitus. Thickening of the esophagus most notably in the mid esophagus (axial image 41 of series 2), poorly evaluated on today's noncontrast CT examination. No axillary lymphadenopathy. Lungs/Pleura: Right-sided pleural calcification. No left pleural calcification identified. Small right and moderate left pleural effusions. The majority of the left pleural effusion is sub pulmonic in distribution. There continues to be generalized mild ground-glass attenuation and interlobular septal thickening in the lungs bilaterally suggesting a background of mild interstitial pulmonary edema. Compared to the prior study, the left lower lobe is now opacified with only a small amount of volume loss, indicating predominantly airspace consolidation rather than simple atelectasis. Upper Abdomen: Aortic atherosclerosis. Musculoskeletal: There are no aggressive appearing lytic or blastic lesions noted in the visualized portions of the skeleton. IMPRESSION: 1. There continues to be evidence of congestive heart failure, as above. However, today's study also demonstrates new airspace consolidation in the left lower lobe concerning for pneumonia. 2. Small right and moderate left pleural effusions. Left pleural effusion has increased substantially in size and is predominantly sub pulmonic in location, potentially loculated.  3. Aortic atherosclerosis, in addition to left main and 3 vessel coronary artery disease. Please note that although the presence of coronary artery calcium documents the presence of coronary artery disease, the severity of this disease and any potential stenosis cannot be assessed on this non-gated CT examination. Assessment for potential risk factor modification, dietary therapy or pharmacologic therapy may be warranted, if clinically indicated. 4. There are calcifications of the aortic valve and mitral annulus. Echocardiographic correlation for evaluation of potential valvular dysfunction may be warranted if clinically indicated. Aortic Atherosclerosis (ICD10-I70.0). Electronically Signed   By: Vinnie Langton M.D.   On: 05/18/2021 05:33   DG Chest Port 1 View  Result Date: 05/18/2021 CLINICAL DATA:  Respiratory distress. Recent left-sided thoracentesis. EXAM: PORTABLE CHEST 1 VIEW COMPARISON:  05/18/2021 FINDINGS: Portable semi upright view of the chest was obtained. Grossly stable cardiomediastinal contours. Aortic atherosclerosis. Moderate-sized right-sided pleural effusion with extensive airspace opacities within the right lung. Diffuse interstitial  opacities throughout the left lung with slight improved aeration of the left lung base. No pneumothorax. Vascular stents again seen in the left axillary and brachiocephalic regions. IMPRESSION: 1. Slight improved aeration of the left lung base post thoracentesis. No pneumothorax. 2. Otherwise stable chest with multifocal opacities and moderate right-sided pleural effusion. Electronically Signed   By: Davina Poke D.O.   On: 05/18/2021 15:37   DG Chest Port 1 View  Result Date: 05/18/2021 CLINICAL DATA:  Status post left thoracentesis EXAM: PORTABLE CHEST 1 VIEW COMPARISON:  05/18/2021, 1:54 a.m. FINDINGS: Interval reduction in volume of a previously seen large left pleural effusion, now small and layering, with improved aeration of the left lung.  Interval increase in volume of right-sided pleural effusion with increased consolidation of the right lung. Cardiomegaly. Left axillary and brachiocephalic stents. IMPRESSION: 1. Interval reduction in volume of a previously seen large left pleural effusion, now small and layering, with improved aeration of the left lung. No pneumothorax. 2. Interval increase in volume of right-sided pleural effusion with increased consolidation of the right lung. 3. Cardiomegaly. Electronically Signed   By: Eddie Candle M.D.   On: 05/18/2021 12:51   DG Chest Port 1 View  Result Date: 05/18/2021 CLINICAL DATA:  Shortness of breath. EXAM: PORTABLE CHEST 1 VIEW COMPARISON:  Chest x-ray 05/14/2021, CT chest 05/13/2021 FINDINGS: Redemonstration of vascular stents overlying the mediastinum as well as left axilla. Enlarged cardiac silhouette. The heart size and mediastinal contours are grossly unchanged. Aortic calcification. No focal consolidation. No pulmonary edema. Interval increase in size of a moderate volume left pleural effusion. At least trace right pleural effusion. Patchy interstitial and airspace opacities. No pneumothorax. No acute osseous abnormality. IMPRESSION: 1. Interval increase in size of a moderate volume left pleural effusion. 2. At least trace right pleural effusion. 3. Patchy interstitial and airspace opacities. Findings could represent infection versus inflammation or pulmonary edema. Electronically Signed   By: Iven Finn M.D.   On: 05/18/2021 02:19   US THORACENTESIS ASP PLEURAL SPACE W/IMG GUIDE  Result Date: 05/18/2021 INDICATION: Shortness of breath. Left-sided pleural effusion. Request for diagnostic and therapeutic thoracentesis. EXAM: ULTRASOUND GUIDED LEFT THORACENTESIS MEDICATIONS: 1% plain lidocaine, 5 mL COMPLICATIONS: None immediate. PROCEDURE: An ultrasound guided thoracentesis was thoroughly discussed with the patient and questions answered. The benefits, risks, alternatives and  complications were also discussed. The patient understands and wishes to proceed with the procedure. Written consent was obtained. Ultrasound was performed to localize and mark an adequate pocket of fluid in the left chest. The area was then prepped and draped in the normal sterile fashion. 1% Lidocaine was used for local anesthesia. Under ultrasound guidance a 6 Fr Safe-T-Centesis catheter was introduced. Thoracentesis was performed. The catheter was removed and a dressing applied. FINDINGS: A total of approximately 1 L of clear, dark yellow fluid was removed. Samples were sent to the laboratory as requested by the clinical team. IMPRESSION: Successful ultrasound guided left thoracentesis yielding 1 L of pleural fluid. Read by: Ascencion Dike PA-C Electronically Signed   By: Jacqulynn Cadet M.D.   On: 05/18/2021 12:57     Assessment and Plan:   Acute systolic heart failure -No chest pain now or leading up to the admission.  SOB likely multifactorial in the setting of his significant volume overload / acute systolic heart failure, valvular dz, ESRD on HD, significant anemia with Hgb of 6.9 at arrival, pleural effusions, +/- recent pneumonia.  Also consider this shortness of breath due to anginal equivalent.  EKG shows T wave inversion in the lateral leads.  RF include male, age, DM2, known vascular disease, and given newly reduced EF at 5 to 35% with LV hypokinesis, further ischemic work-up recommended though pt currently a poor candidate for further workup including invasive workup given his anemia / breathing status / pna / comorbids.  He does have a history of DVT, and given SOB and hypoxia, PE should be considered --> Will defer to IM regarding r/o for PE with CTA versus V/Q.  Recommend optimization of medical management escalation of GDMT from a heart failure standpoint.  Continue current Lasix.  Continue beta-blocker, statin.  Caution with ASA given significant anemia and until anemia work-up  completed.  Further escalation of GDMT as tolerated given reduced EF. Recommend ongoing volume management per nephrology with hemodialysis.   Prolonged QTC -- Avoid QTC prolonging medications.  Ensure electrolytes at goal.  Continue to monitor with periodic EKGs.  Respiratory failure -- Recommend consider PE rule out per IM as above and given prior history of DVT with SOB and hypoxia. As above, shortness of breath likely multifactorial in the setting of his significant volume overload / acute systolic heart failure, valvular dz, ESRD on HD, significant anemia with Hgb of 6.9 at arrival, pleural effusions, +/- recent pneumonia.    Hypertension -- Improved with HD. Continue to titrate medications / continue HD as needed with goal BP 130/80 or lower.   ESRD on HD -- Continue hemodialysis on Monday, Wednesday, and Friday.  Hyperlipidemia --Continue statin.  Will repeat lipids.  DM2 --SSI, per IM   For questions or updates, please contact [place] Please consult [email or web address] for contact info under    Signed, Arvil Chaco, PA-C  05/19/2021 11:22 AM

## 2021-05-20 ENCOUNTER — Inpatient Hospital Stay: Payer: Medicare Other

## 2021-05-20 ENCOUNTER — Encounter: Payer: Self-pay | Admitting: Internal Medicine

## 2021-05-20 DIAGNOSIS — I5022 Chronic systolic (congestive) heart failure: Secondary | ICD-10-CM | POA: Diagnosis not present

## 2021-05-20 DIAGNOSIS — D649 Anemia, unspecified: Secondary | ICD-10-CM | POA: Diagnosis not present

## 2021-05-20 DIAGNOSIS — Z515 Encounter for palliative care: Secondary | ICD-10-CM

## 2021-05-20 DIAGNOSIS — N186 End stage renal disease: Secondary | ICD-10-CM | POA: Diagnosis not present

## 2021-05-20 DIAGNOSIS — I509 Heart failure, unspecified: Secondary | ICD-10-CM | POA: Diagnosis not present

## 2021-05-20 DIAGNOSIS — Z7189 Other specified counseling: Secondary | ICD-10-CM | POA: Diagnosis not present

## 2021-05-20 DIAGNOSIS — J9601 Acute respiratory failure with hypoxia: Secondary | ICD-10-CM | POA: Diagnosis not present

## 2021-05-20 LAB — RESPIRATORY PANEL BY PCR

## 2021-05-20 LAB — BASIC METABOLIC PANEL
Anion gap: 11 (ref 5–15)
BUN: 88 mg/dL — ABNORMAL HIGH (ref 6–20)
CO2: 25 mmol/L (ref 22–32)
Calcium: 7.9 mg/dL — ABNORMAL LOW (ref 8.9–10.3)
Chloride: 94 mmol/L — ABNORMAL LOW (ref 98–111)
Creatinine, Ser: 7.81 mg/dL — ABNORMAL HIGH (ref 0.61–1.24)
GFR, Estimated: 7 mL/min — ABNORMAL LOW (ref >60)
Glucose, Bld: 329 mg/dL — ABNORMAL HIGH (ref 70–99)
Potassium: 5.6 mmol/L — ABNORMAL HIGH (ref 3.5–5.1)
Sodium: 130 mmol/L — ABNORMAL LOW (ref 135–145)

## 2021-05-20 LAB — CBC WITH DIFFERENTIAL/PLATELET
Abs Immature Granulocytes: 0.52 10*3/uL — ABNORMAL HIGH (ref 0.00–0.07)
Basophils Absolute: 0 10*3/uL (ref 0.0–0.1)
Basophils Relative: 0 %
Eosinophils Absolute: 0 10*3/uL (ref 0.0–0.5)
Eosinophils Relative: 0 %
HCT: 19 % — ABNORMAL LOW (ref 39.0–52.0)
Hemoglobin: 5.8 g/dL — ABNORMAL LOW (ref 13.0–17.0)
Immature Granulocytes: 6 %
Lymphocytes Relative: 11 %
Lymphs Abs: 1 10*3/uL (ref 0.7–4.0)
MCH: 25.9 pg — ABNORMAL LOW (ref 26.0–34.0)
MCHC: 30.5 g/dL (ref 30.0–36.0)
MCV: 84.8 fL (ref 80.0–100.0)
Monocytes Absolute: 0.8 10*3/uL (ref 0.1–1.0)
Monocytes Relative: 8 %
Neutro Abs: 6.9 10*3/uL (ref 1.7–7.7)
Neutrophils Relative %: 75 %
Platelets: 122 10*3/uL — ABNORMAL LOW (ref 150–400)
RBC: 2.24 MIL/uL — ABNORMAL LOW (ref 4.22–5.81)
RDW: 19.9 % — ABNORMAL HIGH (ref 11.5–15.5)
Smear Review: NORMAL
WBC: 9.2 10*3/uL (ref 4.0–10.5)
nRBC: 0 % (ref 0.0–0.2)

## 2021-05-20 LAB — HEPATITIS B SURFACE ANTIGEN: Hepatitis B Surface Ag: NONREACTIVE

## 2021-05-20 LAB — HEMOGLOBIN AND HEMATOCRIT, BLOOD
HCT: 23.3 % — ABNORMAL LOW (ref 39.0–52.0)
Hemoglobin: 7.6 g/dL — ABNORMAL LOW (ref 13.0–17.0)

## 2021-05-20 LAB — GLUCOSE, CAPILLARY
Glucose-Capillary: 284 mg/dL — ABNORMAL HIGH (ref 70–99)
Glucose-Capillary: 229 mg/dL — ABNORMAL HIGH (ref 70–99)
Glucose-Capillary: 110 mg/dL — ABNORMAL HIGH (ref 70–99)
Glucose-Capillary: 150 mg/dL — ABNORMAL HIGH (ref 70–99)

## 2021-05-20 LAB — PREPARE RBC (CROSSMATCH)

## 2021-05-20 MED ORDER — PENTAFLUOROPROP-TETRAFLUOROETH EX AERO
1.0000 "application " | INHALATION_SPRAY | CUTANEOUS | Status: DC | PRN
  Filled 2021-05-20: qty 30

## 2021-05-20 MED ORDER — INSULIN ASPART 100 UNIT/ML IJ SOLN: 0.0000 [IU] | Freq: Every day | INTRAMUSCULAR | Status: DC

## 2021-05-20 MED ORDER — HEPARIN SODIUM (PORCINE) 1000 UNIT/ML IJ SOLN
INTRAMUSCULAR | Status: AC
  Filled 2021-05-20: qty 1

## 2021-05-20 MED ORDER — SODIUM CHLORIDE 0.9 % IV SOLN: 100.0000 mL | INTRAVENOUS | Status: DC | PRN

## 2021-05-20 MED ORDER — ALTEPLASE 2 MG IJ SOLR: 2.0000 mg | Freq: Once | INTRAMUSCULAR | Status: DC | PRN

## 2021-05-20 MED ORDER — LIDOCAINE-PRILOCAINE 2.5-2.5 % EX CREA
1.0000 "application " | TOPICAL_CREAM | CUTANEOUS | Status: DC | PRN
  Filled 2021-05-20: qty 5

## 2021-05-20 MED ORDER — IPRATROPIUM-ALBUTEROL 0.5-2.5 (3) MG/3ML IN SOLN
3.0000 mL | Freq: Two times a day (BID) | RESPIRATORY_TRACT | Status: DC
  Administered 2021-05-20 – 2021-05-26 (×10): 3 mL via RESPIRATORY_TRACT
  Filled 2021-05-20 (×11): qty 3

## 2021-05-20 MED ORDER — INSULIN ASPART 100 UNIT/ML IJ SOLN
0.0000 [IU] | Freq: Three times a day (TID) | INTRAMUSCULAR | Status: DC
  Administered 2021-05-20: 3 [IU] via SUBCUTANEOUS
  Administered 2021-05-21: 2 [IU] via SUBCUTANEOUS
  Administered 2021-05-21: 1 [IU] via SUBCUTANEOUS
  Administered 2021-05-21 – 2021-05-22 (×2): 2 [IU] via SUBCUTANEOUS
  Administered 2021-05-22 – 2021-05-23 (×2): 1 [IU] via SUBCUTANEOUS
  Administered 2021-05-23 – 2021-05-24 (×3): 2 [IU] via SUBCUTANEOUS
  Administered 2021-05-24: 09:00:00 1 [IU] via SUBCUTANEOUS
  Administered 2021-05-24: 3 [IU] via SUBCUTANEOUS
  Administered 2021-05-25 – 2021-05-26 (×3): 1 [IU] via SUBCUTANEOUS
  Filled 2021-05-20 (×16): qty 1

## 2021-05-20 MED ORDER — ISOSORB DINITRATE-HYDRALAZINE 20-37.5 MG PO TABS
1.0000 | ORAL_TABLET | Freq: Three times a day (TID) | ORAL | Status: DC
  Filled 2021-05-20 (×3): qty 1

## 2021-05-20 MED ORDER — AZITHROMYCIN 500 MG PO TABS
500.0000 mg | ORAL_TABLET | Freq: Every day | ORAL | Status: AC
  Administered 2021-05-21 – 2021-05-22 (×2): 500 mg via ORAL
  Filled 2021-05-20 (×3): qty 1

## 2021-05-20 MED ORDER — IOHEXOL 350 MG/ML SOLN
75.0000 mL | Freq: Once | INTRAVENOUS | Status: AC | PRN
  Administered 2021-05-20: 75 mL via INTRAVENOUS

## 2021-05-20 MED ORDER — INSULIN DETEMIR 100 UNIT/ML ~~LOC~~ SOLN
8.0000 [IU] | Freq: Every day | SUBCUTANEOUS | Status: DC
  Administered 2021-05-21 – 2021-05-26 (×6): 8 [IU] via SUBCUTANEOUS
  Filled 2021-05-20 (×8): qty 0.08

## 2021-05-20 MED ORDER — ISOSORBIDE DINITRATE 10 MG PO TABS
10.0000 mg | ORAL_TABLET | Freq: Three times a day (TID) | ORAL | Status: DC
  Administered 2021-05-20 – 2021-05-24 (×13): 10 mg via ORAL
  Filled 2021-05-20 (×18): qty 1

## 2021-05-20 MED ORDER — SODIUM CHLORIDE 0.9% IV SOLUTION: Freq: Once | INTRAVENOUS | Status: DC

## 2021-05-20 MED ORDER — HEPARIN SODIUM (PORCINE) 1000 UNIT/ML DIALYSIS: 1000.0000 [IU] | INTRAMUSCULAR | Status: DC | PRN

## 2021-05-20 MED ORDER — CLONIDINE HCL 0.1 MG PO TABS
0.2000 mg | ORAL_TABLET | Freq: Every day | ORAL | Status: DC
  Administered 2021-05-20 – 2021-05-21 (×2): 0.2 mg via ORAL
  Filled 2021-05-20 (×2): qty 2

## 2021-05-20 MED ORDER — HYDRALAZINE HCL 50 MG PO TABS
50.0000 mg | ORAL_TABLET | Freq: Three times a day (TID) | ORAL | Status: DC
  Administered 2021-05-20 – 2021-05-22 (×6): 50 mg via ORAL
  Filled 2021-05-20 (×6): qty 1

## 2021-05-20 MED ORDER — LIDOCAINE HCL (PF) 1 % IJ SOLN
5.0000 mL | INTRAMUSCULAR | Status: DC | PRN
  Filled 2021-05-20: qty 5

## 2021-05-20 NOTE — Significant Event (Signed)
Rapid Response Event Note   Reason for Call :   Hypoxia Initial Focused Assessment:  Patient receiving dialysis when his oxygen saturation drops to low 80's- patient was placed on non rebreather mask- per dialysis nurse and oxygen remained in low 80's. Patient lung sounds rhonchours.  Otherwise other vitals stable- patient alert and oriented.  Dialysis nurse stated that she was not removing fluid- just cleaning patient's blood.     Interventions:  Dr. Priscella Mann made aware and assess patient-  Orders for chest xray, lasix and solumedrol. Plan of Care:  Patient transferred to ICU and placed on bipap.   Event Summary:   MD Notified: Dr. Priscella Mann Call Time:14:48 Arrival Time:14:50 End Time:15:10  Penne Lash, RN

## 2021-05-20 NOTE — Progress Notes (Addendum)
PROGRESS NOTE    [name]  EOF:[id number] DOB: Apr 27, 1961 DOA: 05/18/2021 PCP: [name], MD    Brief Narrative:   60 year old male history of ESRD on hemodialysis MWF, hypertension, seizure disorder, anemia, systolic chronic heart failure, hidradenitis on [name], bilateral BKA who presents to the ED for evaluation of worsening shortness of breath.  Patient was recently admitted and discharged on 7/29 for similar complaints.  Now presents with persistent fluid overload, possible pneumonia associated with parapneumonic effusion.  Patient is chronically ill and majority of history is gained by speaking with patient's wife.  Patient's wife states that shortness of breath progressively worsened after discharge.  Patient has no associated chest pain, productive cough, fever, chills.  Initially required 10 L oxygen to maintain saturation greater than 90.  Hemoglobin also dropped to 6.8 from baseline approximate.ly 8.  Started on antibiotics for pneumonia, thoracentesis ordered, nephrology consulted.  8/1: Received page from ICU charge RN.  Patient had rapid response called while in hemodialysis.  I went and evaluated the patient at bedside.  Found him to be on a nonrebreather with saturations in the low to mid 80s.  Work of breathing not especially increased.  Lung sounds very coarse and rhonchorous.  Unable to wean off nonrebreather.  Of note patient did get a thoracentesis with 1 L fluid removed today.  No fluid removal was attempted with hemodialysis.  Patient only completed about 30 minutes of his dialysis session.   Will be transferred to stepdown unit for closer monitoring.  BiPAP initiated.  COPD treatment started.  Case discussed at length with ICU attending.  Consultation greatly appreciated  8/2: Respiratory status much improved.  Patient weaned off BiPAP.  Saturating well on 2 L nasal cannula.  Stable for transfer to medical floor.  Cardiology consult requested and  appreciated.   Assessment & Plan:   Principal Problem:   Acute respiratory failure with hypoxia (HCC) Active Problems:   Hypertension   Anemia due to chronic kidney disease   Crohn disease (HCC)   Hidradenitis suppurativa   Type 2 diabetes mellitus with kidney complication, with long-term current use of insulin (HCC)   End stage renal failure on dialysis [facility])   History of CVA (cerebrovascular accident)   Seizure disorder ([place])   S/P bilateral BKA (below knee amputation) ([place])   Acute on chronic anemia   Chronic systolic CHF (congestive heart failure) ([place])   Long term current use of immunosuppressive drug   Acute decompensated heart failure (HCC)  Acute respiratory failure with hypoxia RRT called during dialysis on 8/1 for worsening hypoxia requiring transfer to stepdown for BiPAP,  Weaned off BiPAP as of 8/2, saturating well on 2 L Holding Lasix, getting dialyzed today CT chest this morning was negative for PE  Acute on chronic anemia of chronic kidney disease Baseline hemoglobin in the eights Presented with hemoglobin 6.8-> 5.8 Transfuse 1 unit PRBC on 8/3 with dialysis Will check posttransfusion H&H  Community-acquired pneumonia On CAP coverage, azithromycin and Rocephin Pulmonology consult requested, recommendations appreciated Patient is immunocompromised on [name] Extensive infectious work-up in progress Dr. Lanney [name] is following  Acute on chronic decompensated systolic and diastolic congestive heart failure Echo showing EF of 30 to 35%.  Ischemic work-up not planned by cardiology due to severe anemia.  Medical management recommended -Continue Coreg, hydralazine, isosorbide for now  Essential hypertension -Cardiology have stopped amlodipine and planning to taper off clonidine -Continue Coreg, hydralazine, isosorbide for now  End-stage renal disease on hemodialysis Nephrology following  HD today  Hyperlipidemia Continue statin  Type 2 diabetes  mellitus Continue sliding scale.  And insulin Levemir 8 units subcu daily  Seizure disorder Continue Keppra  History of hidradenitis  [name] on hold  Peripheral arterial disease status post bilateral BKA No acute issues  Severe Protein-calorie malnutrition As evidenced by muscle wasting Body mass index is 18.14 kg/m.   Goals of care Overall very poor prognosis Palliative care had meeting with family/patient today and plan is for comfort care/hospice and stop dialysis after tomorrow's family meeting with palliative care  DVT prophylaxis: SQ heparin Code Status: Full code Family Communication: Wife [name] [phone] is updated by me on 8/3 Disposition Plan: Status is: Inpatient  Remains inpatient appropriate because:Inpatient level of care appropriate due to severity of illness  Dispo: The patient is from: Home              Anticipated d/c is to: Home with hospice versus hospice home              Patient currently is not medically stable to d/c.   Difficult to place patient No  Acute hypoxic respiratory failure, multifactorial secondary to ESRD, decompensated heart failure, community-acquired pneumonia, bilateral pleural effusions.  Disposition plan unclear at this time.     Level of care: Progressive Cardiac  Consultants:  Pulmonary Cardiology Nephrology  Procedures:  Thoracentesis 8/1  Antimicrobials:  Ceftriaxone Azithromycin   Subjective: Patient seen and examined.  Getting dialyzed at bedside.  Feels overall very tired and exhausted from his illness and requesting to keep him comfortable.  Blood pressure running high and hemoglobin dropped to 5.8  Objective: Vitals:   05/20/21 1420 05/20/21 1432 05/20/21 1500 05/20/21 1600  BP: (!) 151/70 (!) 147/73 (!) 153/71 (!) 164/85  Pulse:  72 71 75  Resp:  19 (!) 22 (!) 22  Temp: 98.3 F (36.8 C)     TempSrc: Oral     SpO2:  98% 97% 97%  Weight:      Height:        Intake/Output Summary (Last 24 hours)  at 05/20/2021 1732 Last data filed at 05/20/2021 1600 Gross per 24 hour  Intake 1192.36 ml  Output 1500 ml  Net -307.64 ml   Filed Weights   05/18/21 1507 05/19/21 0414 05/20/21 0500  Weight: 58.9 kg 56.4 kg 59 kg    Examination:  General exam: No acute distress.  Appears frail and chronically ill Respiratory system: Bilateral coarse crackles.  Normal work of breathing.  2 L Cardiovascular system: S1-S2, regular rate and rhythm, 2/6 systolic murmur  gastrointestinal system: Thin, nontender, nondistended, normal bowel sounds Central nervous system: Alert and oriented.  No focal deficits Extremities: Bilateral BKA Skin: Scattered excoriations.  No ulcers or induration Psychiatry: Judgement and insight appear normal. Mood & affect appropriate.     Data Reviewed: I have personally reviewed following labs and imaging studies  CBC: Recent Labs  Lab 05/18/21 0143 05/18/21 0547 05/19/21 0405 05/20/21 0437 05/20/21 1420  WBC 6.1 6.9 6.1 9.2  --   NEUTROABS 4.5  --  5.2 6.9  --   HGB 6.8* 7.8* 7.2* 5.8* 7.6*  HCT 22.8* 25.9* 23.5* 19.0* 23.3*  MCV 84.8 83.5 85.1 84.8  --   PLT 97* 123* 93* 122*  --    Basic Metabolic Panel: Recent Labs  Lab 05/15/21 0649 05/18/21 0143 05/18/21 0547 05/19/21 0405 05/20/21 0437  NA 136 134*  --  137 130*  K 4.0 4.6  --  5.3*  5.6*  CL 95* 95*  --  97* 94*  CO2 28 27  --  26 25  GLUCOSE 204* 183*  --  200* 329*  BUN 40* 49*  --  60* 88*  CREATININE 5.29* 6.30* 6.48* 6.88* 7.81*  CALCIUM 8.8* 8.7*  --  8.5* 7.9*  PHOS  --   --   --  6.9*  --    GFR: Estimated Creatinine Clearance: 8.6 mL/min (A) (by C-G formula based on SCr of 7.81 mg/dL (H)). Liver Function Tests: No results for input(s): AST, ALT, ALKPHOS, BILITOT, PROT, ALBUMIN in the last 168 hours. No results for input(s): LIPASE, AMYLASE in the last 168 hours. No results for input(s): AMMONIA in the last 168 hours. Coagulation Profile: No results for input(s): INR, PROTIME in the  last 168 hours. Cardiac Enzymes: No results for input(s): CKTOTAL, CKMB, CKMBINDEX, TROPONINI in the last 168 hours. BNP (last 3 results) No results for input(s): PROBNP in the last 8760 hours. HbA1C: Recent Labs    05/18/21 0547  HGBA1C 6.0*   CBG: Recent Labs  Lab 05/19/21 1922 05/19/21 1927 05/20/21 0740 05/20/21 1136 05/20/21 1557  GLUCAP 278* 257* 284* 229* 110*   Lipid Profile: No results for input(s): CHOL, HDL, LDLCALC, TRIG, CHOLHDL, LDLDIRECT in the last 72 hours. Thyroid Function Tests: No results for input(s): TSH, T4TOTAL, FREET4, T3FREE, THYROIDAB in the last 72 hours. Anemia Panel: No results for input(s): VITAMINB12, FOLATE, FERRITIN, TIBC, IRON, RETICCTPCT in the last 72 hours. Sepsis Labs: Recent Labs  Lab 05/18/21 0143  PROCALCITON 1.09    Recent Results (from the past 240 hour(s))  Resp Panel by RT-PCR (Flu A&B, Covid) Nasopharyngeal Swab     Status: None   Collection Time: 05/12/21  9:03 PM   Specimen: Nasopharyngeal Swab; Nasopharyngeal(NP) swabs in vial transport medium  Result Value Ref Range Status   SARS Coronavirus 2 by RT PCR NEGATIVE NEGATIVE Final    Comment: (NOTE) SARS-CoV-2 target nucleic acids are NOT DETECTED.  The SARS-CoV-2 RNA is generally detectable in upper respiratory specimens during the acute phase of infection. The lowest concentration of SARS-CoV-2 viral copies this assay can detect is 138 copies/mL. A negative result does not preclude SARS-Cov-2 infection and should not be used as the sole basis for treatment or other patient management decisions. A negative result may occur with  improper specimen collection/handling, submission of specimen other than nasopharyngeal swab, presence of viral mutation(s) within the areas targeted by this assay, and inadequate number of viral copies(<138 copies/mL). A negative result must be combined with clinical observations, patient history, and epidemiological information. The  expected result is Negative.  Fact Sheet for Patients:  EntrepreneurPulse.com.au  Fact Sheet for Healthcare Providers:  IncredibleEmployment.be  This test is no t yet approved or cleared by the [place] FDA and  has been authorized for detection and/or diagnosis of SARS-CoV-2 by FDA under an Emergency Use Authorization (EUA). This EUA will remain  in effect (meaning this test can be used) for the duration of the COVID-19 declaration under Section 564(b)(1) of the Act, 21 U.S.C.section 360bbb-3(b)(1), unless the authorization is terminated  or revoked sooner.       Influenza A by PCR NEGATIVE NEGATIVE Final   Influenza B by PCR NEGATIVE NEGATIVE Final    Comment: (NOTE) The Xpert Xpress SARS-CoV-2/FLU/RSV plus assay is intended as an aid in the diagnosis of influenza from Nasopharyngeal swab specimens and should not be used as a sole basis for treatment. Nasal washings and  aspirates are unacceptable for Xpert Xpress SARS-CoV-2/FLU/RSV testing.  Fact Sheet for Patients: EntrepreneurPulse.com.au  Fact Sheet for Healthcare Providers: IncredibleEmployment.be  This test is not yet approved or cleared by the [place] FDA and has been authorized for detection and/or diagnosis of SARS-CoV-2 by FDA under an Emergency Use Authorization (EUA). This EUA will remain in effect (meaning this test can be used) for the duration of the COVID-19 declaration under Section 564(b)(1) of the Act, 21 U.S.C. section 360bbb-3(b)(1), unless the authorization is terminated or revoked.  Performed at [facility], [address] [place], [place] [address]   MRSA Next Gen by PCR, Nasal     Status: None   Collection Time: 05/12/21 11:06 PM   Specimen: Nasal Mucosa; Nasal Swab  Result Value Ref Range Status   MRSA by PCR Next Gen NOT DETECTED NOT DETECTED Final    Comment: (NOTE) The GeneXpert MRSA Assay (FDA  approved for NASAL specimens only), is one component of a comprehensive MRSA colonization surveillance program. It is not intended to diagnose MRSA infection nor to guide or monitor treatment for MRSA infections. Test performance is not FDA approved in patients less than 65 years old. Performed at [facility], [address] [place], [place] [address]   Culture, blood (routine x 2) Call MD if unable to obtain prior to antibiotics being given     Status: None   Collection Time: 05/12/21 11:54 PM   Specimen: BLOOD  Result Value Ref Range Status   Specimen Description BLOOD RIGHT FOREARM  Final   Special Requests   Final    BOTTLES DRAWN AEROBIC ONLY Blood Culture results may not be optimal due to an inadequate volume of blood received in culture bottles   Culture   Final    NO GROWTH 5 DAYS Performed at [facility], [address] [place], [place] [address]    Report Status 05/18/2021 FINAL  Final  Resp Panel by RT-PCR (Flu A&B, Covid) Nasopharyngeal Swab     Status: None   Collection Time: 05/18/21  1:43 AM   Specimen: Nasopharyngeal Swab; Nasopharyngeal(NP) swabs in vial transport medium  Result Value Ref Range Status   SARS Coronavirus 2 by RT PCR NEGATIVE NEGATIVE Final    Comment: (NOTE) SARS-CoV-2 target nucleic acids are NOT DETECTED.  The SARS-CoV-2 RNA is generally detectable in upper respiratory specimens during the acute phase of infection. The lowest concentration of SARS-CoV-2 viral copies this assay can detect is 138 copies/mL. A negative result does not preclude SARS-Cov-2 infection and should not be used as the sole basis for treatment or other patient management decisions. A negative result may occur with  improper specimen collection/handling, submission of specimen other than nasopharyngeal swab, presence of viral mutation(s) within the areas targeted by this assay, and inadequate number of viral copies(<138 copies/mL). A negative result  must be combined with clinical observations, patient history, and epidemiological information. The expected result is Negative.  Fact Sheet for Patients:  EntrepreneurPulse.com.au  Fact Sheet for Healthcare Providers:  IncredibleEmployment.be  This test is no t yet approved or cleared by the [place] FDA and  has been authorized for detection and/or diagnosis of SARS-CoV-2 by FDA under an Emergency Use Authorization (EUA). This EUA will remain  in effect (meaning this test can be used) for the duration of the COVID-19 declaration under Section 564(b)(1) of the Act, 21 U.S.C.section 360bbb-3(b)(1), unless the authorization is terminated  or revoked sooner.       Influenza  A by PCR NEGATIVE NEGATIVE Final   Influenza B by PCR NEGATIVE NEGATIVE Final    Comment: (NOTE) The Xpert Xpress SARS-CoV-2/FLU/RSV plus assay is intended as an aid in the diagnosis of influenza from Nasopharyngeal swab specimens and should not be used as a sole basis for treatment. Nasal washings and aspirates are unacceptable for Xpert Xpress SARS-CoV-2/FLU/RSV testing.  Fact Sheet for Patients: EntrepreneurPulse.com.au  Fact Sheet for Healthcare Providers: IncredibleEmployment.be  This test is not yet approved or cleared by the [place] FDA and has been authorized for detection and/or diagnosis of SARS-CoV-2 by FDA under an Emergency Use Authorization (EUA). This EUA will remain in effect (meaning this test can be used) for the duration of the COVID-19 declaration under Section 564(b)(1) of the Act, 21 U.S.C. section 360bbb-3(b)(1), unless the authorization is terminated or revoked.  Performed at [facility], [address] [place], [place] [address]   Blood culture (routine x 2)     Status: None (Preliminary result)   Collection Time: 05/18/21  1:44 AM   Specimen: BLOOD  Result Value Ref Range Status    Specimen Description BLOOD RIGHT ANTECUBITAL  Final   Special Requests   Final    BOTTLES DRAWN AEROBIC AND ANAEROBIC Blood Culture adequate volume   Culture   Final    NO GROWTH 2 DAYS Performed at [facility], [address], [place], [place] [address]    Report Status PENDING  Incomplete  Blood culture (routine x 2)     Status: None (Preliminary result)   Collection Time: 05/18/21  1:44 AM   Specimen: BLOOD  Result Value Ref Range Status   Specimen Description BLOOD BLOOD RIGHT FOREARM  Final   Special Requests   Final    BOTTLES DRAWN AEROBIC AND ANAEROBIC Blood Culture adequate volume   Culture   Final    NO GROWTH 2 DAYS Performed at [facility], [address] [place], [place] [address]    Report Status PENDING  Incomplete  Acid Fast Smear (AFB)     Status: None   Collection Time: 05/18/21 11:25 AM   Specimen: PATH Cytology Pleural fluid  Result Value Ref Range Status   AFB Specimen Processing Concentration  Final   Acid Fast Smear Negative  Final    Comment: (NOTE) Performed At: [facility] [place], [place] [id number] Rush Farmer MD [id number]    Source (AFB) PLEURAL  Final    Comment: Performed at [facility], [address] [place], [place] [address]  Body fluid culture w Gram Stain     Status: None (Preliminary result)   Collection Time: 05/18/21 11:25 AM   Specimen: PATH Cytology Pleural fluid  Result Value Ref Range Status   Specimen Description   Final    PLEURAL Performed at [facility], [address], [place], [place] [address]    Special Requests   Final    NONE Performed at [facility], [address] [place], [place] [address]    Gram Stain   Final    RARE WBC PRESENT, PREDOMINANTLY MONONUCLEAR NO ORGANISMS SEEN    Culture   Final    NO GROWTH 2 DAYS Performed at [facility], [place] [address], [place],  [address]    Report Status PENDING   Incomplete  Respiratory (~20 pathogens) panel by PCR     Status: None   Collection Time: 05/20/21  7:41 AM   Specimen: Nasopharyngeal Swab; Respiratory  Result Value Ref Range Status   Adenovirus NOT DETECTED NOT DETECTED Final   Coronavirus 229E NOT DETECTED NOT DETECTED Final    Comment: (NOTE) The Coronavirus on the Respiratory Panel, DOES NOT test for the novel  Coronavirus (2019 nCoV)    Coronavirus HKU1 NOT DETECTED NOT DETECTED Final   Coronavirus NL63 NOT DETECTED NOT DETECTED Final   Coronavirus OC43 NOT DETECTED NOT DETECTED Final   Metapneumovirus NOT DETECTED NOT DETECTED Final   Rhinovirus / Enterovirus NOT DETECTED NOT DETECTED Final   Influenza A NOT DETECTED NOT DETECTED Final   Influenza B NOT DETECTED NOT DETECTED Final   Parainfluenza Virus 1 NOT DETECTED NOT DETECTED Final   Parainfluenza Virus 2 NOT DETECTED NOT DETECTED Final   Parainfluenza Virus 3 NOT DETECTED NOT DETECTED Final   Parainfluenza Virus 4 NOT DETECTED NOT DETECTED Final   Respiratory Syncytial Virus NOT DETECTED NOT DETECTED Final   Bordetella pertussis NOT DETECTED NOT DETECTED Final   Bordetella Parapertussis NOT DETECTED NOT DETECTED Final   Chlamydophila pneumoniae NOT DETECTED NOT DETECTED Final   Mycoplasma pneumoniae NOT DETECTED NOT DETECTED Final    Comment: Performed at [facility], [place] [address], [place], [place] [address]         Radiology Studies: CT Angio Chest Pulmonary Embolism (PE) W or WO Contrast  Result Date: 05/20/2021 CLINICAL DATA:  Shortness of breath.  PE suspected, high prob EXAM: CT ANGIOGRAPHY CHEST WITH CONTRAST TECHNIQUE: Multidetector CT imaging of the chest was performed using the standard protocol during bolus administration of intravenous contrast. Multiplanar CT image reconstructions and MIPs were obtained to evaluate the vascular anatomy. CONTRAST:  59m OMNIPAQUE IOHEXOL 350 MG/ML SOLN COMPARISON:  05/18/2021 FINDINGS: Cardiovascular: No filling  defects in the pulmonary arteries to suggest pulmonary emboli. Extensive coronary artery calcifications and scattered aortic calcifications. No aneurysm. Heart is mildly enlarged. Mediastinum/Nodes: Mildly prominent mediastinal lymph nodes, similar to prior study. Esophageal wall thickening again noted in the mid esophagus. Trachea and thyroid unremarkable. Lungs/Pleura: Small bilateral pleural effusions, similar prior study. Bilateral lower lobe airspace opacities, with some improvement in the left lower lobe since prior study. Pleural calcifications in the right lower hemithorax, stable. Upper Abdomen: Imaging into the upper abdomen demonstrates no acute findings. Musculoskeletal: Chest wall soft tissues are unremarkable. No acute bony abnormality. Review of the MIP images confirms the above findings. IMPRESSION: No evidence of pulmonary embolus. Cardiomegaly, coronary artery disease. Small bilateral effusions. Bilateral lower lobe airspace opacities with some improvement on the left since prior study. Findings could reflect edema or infection. Aortic Atherosclerosis (ICD10-I70.0). Electronically Signed   By: KRolm BaptiseM.D.   On: 05/20/2021 08:37        Scheduled Meds:  sodium chloride   Intravenous Once   sodium chloride   Intravenous Once   arformoterol  15 mcg Nebulization BID   vitamin C  500 mg Oral BID   aspirin  81 mg Oral Daily   atorvastatin  80 mg Oral Daily   [START ON 05/21/2021] azithromycin  500 mg Oral Daily   budesonide (PULMICORT) nebulizer solution  0.25 mg Nebulization BID   carvedilol  25 mg Oral BID   chlorhexidine  15 mL Mouth Rinse BID   Chlorhexidine Gluconate Cloth  6 each Topical Q0600   Chlorhexidine Gluconate Cloth  6 each Topical Q0600   cloNIDine  0.2 mg Oral Daily   epoetin (EPOGEN/PROCRIT) injection  10,000 Units Intravenous Q M,W,F-HD   feeding supplement (NEPRO CARB STEADY)  237 mL Oral TID BM   ferric citrate  420 mg Oral TID WC   gabapentin  400 mg Oral  QHS   heparin  5,000 Units Subcutaneous Q8H   heparin sodium (porcine)       hydrALAZINE  50 mg Oral Q8H   insulin aspart  0-5 Units Subcutaneous QHS   insulin aspart  0-9 Units Subcutaneous TID WC   insulin detemir  8 Units Subcutaneous Daily   ipratropium-albuterol  3 mL Nebulization BID   isosorbide dinitrate  10 mg Oral TID   levETIRAcetam  500 mg Oral Daily   mouth rinse  15 mL Mouth Rinse q12n4p   multivitamin  1 tablet Oral QHS   sevelamer carbonate  1,600 mg Oral TID WC   Continuous Infusions:  sodium chloride     sodium chloride     cefTRIAXone (ROCEPHIN)  IV Stopped (05/20/21 0622)     LOS: 1 day    Time spent: 35 minutes    Max Sane, MD [facility] Pager [phone]  If 7PM-7AM, please contact night-coverage 05/20/2021, 5:32 PM

## 2021-05-20 NOTE — [place] Notes (Signed)
PHARMACIST - PHYSICIAN COMMUNICATION  CONCERNING: Antibiotic IV to Oral Route Change Policy  RECOMMENDATION: This patient is receiving azithromycin by the intravenous route.  Based on criteria approved by the Pharmacy and Therapeutics Committee, the antibiotic(s) is/are being converted to the equivalent oral dose form(s).   DESCRIPTION: These criteria include: Patient being treated for a respiratory tract infection, urinary tract infection, cellulitis or clostridium difficile associated diarrhea if on metronidazole The patient is not neutropenic and does not exhibit a GI malabsorption state The patient is eating (either orally or via tube) and/or has been taking other orally administered medications for a least 24 hours The patient is improving clinically and has a Tmax < 100.5  If you have questions about this conversion, please contact the [facility]  05/20/21

## 2021-05-20 NOTE — Consult Note (Signed)
Consultation Note Date: 05/20/2021   Patient Name: [name]  DOB: Apr 26, 1961  MRN: [id number]  Age / Sex: 60 y.o., male  PCP: [name], MD Referring Physician: Max Sane, MD  Reason for Consultation: Establishing goals of care  HPI/Patient Profile: 60 y.o. male  with past medical history of ESRD on HD, HTN, seizures, anemia, systolic heart failure, CVA, Crohns,  hiradenitis on [name], PAD, and bilateral BKA admitted on 05/18/2021 with shortness of breath. Patient was recently admitted and discharged about 2 days ago after being treated for fluid overload and possible pneumonia. This admission, patient treated for acute respiratory failure acute CHF. CT negative for PE. PMT consulted by Dr. Juleen China for [facility] discussion.   Clinical Assessment and Goals of Care: I have reviewed medical records including EPIC notes, labs and imaging, received report from RN, assessed the patient and then spoke with patient's wife, [name],  to discuss diagnosis prognosis, [place], EOL wishes, disposition and options.  Attempted to speak with patient - he was receiving HD - in and out of sleep. Nurses report he is disoriented to time/day but seems oriented to self, place, and situation.   When speaking to wife, she tells me of patient's frequent confusion - good days and bad days. Tells me of patient's diagnosis of dementia earlier this year. She does not feel patient has capacity to make decisions for himself.   I introduced Palliative Medicine as specialized medical care for people living with serious illness. It focuses on providing relief from the symptoms and stress of a serious illness. The goal is to improve quality of life for both the patient and the family.  Wife tells me of difficulties at home. Tells me caring for patient at home following discharge 7/29 was very challenging. Patient has become weaker - spends most of his time on the couch. Patient had  been able to transfer himself independently but now requires assistance. He has become too weak to push his wheelchair. She also tells me of declined appetite - eats only one meal a day.  She tells me he spends most of his time asleep. She tells me of cognitive decline at home.   We discussed patient's current illness and what it means in the larger context of patient's on-going co-morbidities.  Natural disease trajectory and expectations at EOL were discussed. We discussed progressive nature of patient's multiple diseases - heart failure, vascular disease, dementia.   I attempted to elicit values and goals of care important to the patient.  Wife shares this is not something patient has spoke about before - she plans to attempt to discuss his goals with him tonight.   The difference between aggressive medical intervention and comfort care was considered.  Wife shares that other family members have mentioned hospice to her. We discuss philosophy of hospice care and type of support provided. Discussed elevating patient's comfort above all else. Discussed hospice care at home vs hospice facility. Discussed cessation of HD, discussed prognosis of less than 2 weeks following cessation of HD.   We discuss the alternative to comfort care path - discussed potential rehab - wife shares patient has been to rehab before and it did not go well, he did not participate with therapy. We discussed expectation of repeated hospitalizations if patient continues to pursue aggressive care.   Discussed full code vs DNR.  Wife shares this is a lot of information to process and consider. She would like time to think through above topics. She would  also like to include other family members including her children in these decisions. We discussed a plan to allow her today and this evening to discuss with family and patient regarding topics above and then following up Thurs or Fri regarding plan of care. She was agreeable to  this.   Discussed with wife the importance of continued conversation with family and the medical providers regarding overall plan of care and treatment options, ensuring decisions are within the context of the patient's values and GOCs.    Questions and concerns were addressed. The family was encouraged to call with questions or concerns.   Primary Decision Maker Wife - [name] - plan to speak with patient to determine if he is able to participate in decision making - unable today d/t HD and lethargy   SUMMARY OF RECOMMENDATIONS   - wife considering a comfort path and cessation of HD, involve hospice - allowing time to consider and speak to patient/other family members - f/u with patient is needed to determine his ability to participate in decision making - will f/u 8/4 - f/u on code status   Code Status/Advance Care Planning: Full code     Primary Diagnoses: Present on Admission:  Acute respiratory failure with hypoxia ([place])  Hypertension  Anemia due to chronic kidney disease  Crohn disease ([place])  Hidradenitis suppurativa  Chronic systolic CHF (congestive heart failure) ([place])   I have reviewed the medical record, interviewed the patient and family, and examined the patient. The following aspects are pertinent.  Past Medical History:  Diagnosis Date   Acute metabolic encephalopathy 01/19/101   Anemia    Crohn disease ([place])    Diabetes mellitus without complication ([place])    DVT of lower extremity (deep venous thrombosis) ([place]) 2016   Empyema ([place]) 05/20/2017   Encephalopathy 12/04/2017   Fall at home, initial encounter 09/12/2020   Hidradenitis suppurativa    Hypertension    ICH (intracerebral hemorrhage) ([place])    Peritonitis ([place]) 04/21/2017   Pyogenic arthritis of knee ([place]) 02/04/2016   Renal disorder    Sepsis ([place]) 01/12/2018   Stroke [facility])    Social History   Socioeconomic History   Marital status: Married    Spouse name: Not on file   Number of children: 2    Years of education: Not on file   Highest education level: Associate degree: occupational, Hotel manager, or vocational program  Occupational History   Occupation: disable  Tobacco Use   Smoking status: Former   Smokeless tobacco: Never   Tobacco comments:    smokes marijuana  Vaping Use   Vaping Use: Never used  Substance and Sexual Activity   Alcohol use: No   Drug use: Yes    Types: Marijuana   Sexual activity: Not Currently  Other Topics Concern   Not on file  Social History Narrative   Not on file   Social Determinants of Health   Financial Resource Strain: Low Risk    Difficulty of Paying Living Expenses: Not hard at all  Food Insecurity: No Food Insecurity   Worried About Charity fundraiser in the Last Year: Never true   [place] in the Last Year: Never true  Transportation Needs: No Transportation Needs   Lack of Transportation (Medical): No   Lack of Transportation (Non-Medical): No  Physical Activity: Inactive   Days of Exercise per Week: 0 days   Minutes of Exercise per Session: 0 min  Stress: No Stress Concern Present  Feeling of Stress : Only a little  Social Connections: Moderately Isolated   Frequency of Communication with Friends and Family: Twice a week   Frequency of Social Gatherings with Friends and Family: Three times a week   Attends Religious Services: Never   Active Member of Clubs or Organizations: No   Attends Archivist Meetings: Never   Marital Status: Married   Family History  Problem Relation Age of Onset   Irritable bowel syndrome Sister    Diabetes Sister    Heart disease Mother    Diabetes Mother    Heart disease Father    Rheumatic fever Father        as child   Psoriasis Brother    Arthritis Brother    Diabetes Sister    Diabetes Sister    Scheduled Meds:  sodium chloride   Intravenous Once   sodium chloride   Intravenous Once   arformoterol  15 mcg Nebulization BID   vitamin C  500 mg Oral BID    aspirin  81 mg Oral Daily   atorvastatin  80 mg Oral Daily   budesonide (PULMICORT) nebulizer solution  0.25 mg Nebulization BID   carvedilol  25 mg Oral BID   chlorhexidine  15 mL Mouth Rinse BID   Chlorhexidine Gluconate Cloth  6 each Topical Q0600   Chlorhexidine Gluconate Cloth  6 each Topical Q0600   cloNIDine  0.2 mg Oral Daily   epoetin (EPOGEN/PROCRIT) injection  10,000 Units Intravenous Q M,W,F-HD   feeding supplement (NEPRO CARB STEADY)  237 mL Oral TID BM   ferric citrate  420 mg Oral TID WC   gabapentin  400 mg Oral QHS   heparin  5,000 Units Subcutaneous Q8H   heparin sodium (porcine)       hydrALAZINE  50 mg Oral Q8H   insulin aspart  0-5 Units Subcutaneous QHS   insulin aspart  0-9 Units Subcutaneous TID WC   insulin detemir  8 Units Subcutaneous Daily   ipratropium-albuterol  3 mL Nebulization BID   isosorbide dinitrate  10 mg Oral TID   levETIRAcetam  500 mg Oral Daily   mouth rinse  15 mL Mouth Rinse q12n4p   multivitamin  1 tablet Oral QHS   sevelamer carbonate  1,600 mg Oral TID WC   Continuous Infusions:  sodium chloride     sodium chloride     azithromycin Stopped (05/20/21 0723)   cefTRIAXone (ROCEPHIN)  IV Stopped (05/20/21 0622)   PRN Meds:.sodium chloride, sodium chloride, acetaminophen **OR** acetaminophen, alteplase, heparin, lidocaine (PF), lidocaine-prilocaine, pentafluoroprop-tetrafluoroeth Allergies  Allergen Reactions   Methotrexate Other (See Comments)    Blood count drops   Vancomycin Shortness Of Breath    Eyes watering, SOB, wheezing   Cefepime Other (See Comments)   Tape    Review of Systems  Unable to perform ROS - lethargic on HD  Physical Exam Constitutional:      Comments: lethargic  Pulmonary:     Effort: Pulmonary effort is normal.  Skin:    General: Skin is warm and dry.    Vital Signs: BP (!) 155/71   Pulse 66   Temp 98.3 F (36.8 C) (Oral)   Resp 18   Ht 5' 11"  (1.803 m)   Wt 59 kg   SpO2 100%   BMI 18.14  kg/m  Pain Scale: 0-10   Pain Score: 0-No pain   SpO2: SpO2: 100 % O2 Device:SpO2: 100 % O2 Flow Rate: .O2 Flow Rate (  L/min): 2 L/min  IO: Intake/output summary:  Intake/Output Summary (Last 24 hours) at 05/20/2021 1329 Last data filed at 05/20/2021 1245 Gross per 24 hour  Intake 955.36 ml  Output 0 ml  Net 955.36 ml    LBM: Last BM Date: 05/15/21 Baseline Weight: Weight: 56.7 kg Most recent weight: Weight: 59 kg     Palliative Assessment/Data: PPS 40%    Time Total: 80 minutes Greater than 50%  of this time was spent counseling and coordinating care related to the above assessment and plan.  Juel Burrow, DNP, AGNP-C Palliative Medicine Team [phone] Pager: [phone]

## 2021-05-20 NOTE — Progress Notes (Signed)
Pt tired quickly on Metaneb & was unable to complete full tx

## 2021-05-20 NOTE — Progress Notes (Addendum)
Progress Note  Patient Name: [name] Date of Encounter: 05/20/2021  Primary Cardiologist: [facility], Dr. Saunders Revel  Subjective   No CP or SOB. Somnolent but wakes to touch. HD currently running.   Inpatient Medications    Scheduled Meds:  sodium chloride   Intravenous Once   sodium chloride   Intravenous Once   arformoterol  15 mcg Nebulization BID   vitamin C  500 mg Oral BID   aspirin  81 mg Oral Daily   atorvastatin  80 mg Oral Daily   budesonide (PULMICORT) nebulizer solution  0.25 mg Nebulization BID   carvedilol  25 mg Oral BID   chlorhexidine  15 mL Mouth Rinse BID   Chlorhexidine Gluconate Cloth  6 each Topical Q0600   Chlorhexidine Gluconate Cloth  6 each Topical Q0600   cloNIDine  0.2 mg Oral Daily   epoetin (EPOGEN/PROCRIT) injection  10,000 Units Intravenous Q M,W,F-HD   feeding supplement (NEPRO CARB STEADY)  237 mL Oral TID BM   ferric citrate  420 mg Oral TID WC   gabapentin  400 mg Oral QHS   heparin  5,000 Units Subcutaneous Q8H   heparin sodium (porcine)       hydrALAZINE  50 mg Oral Q8H   insulin aspart  0-5 Units Subcutaneous QHS   insulin aspart  0-9 Units Subcutaneous TID WC   insulin detemir  8 Units Subcutaneous Daily   ipratropium-albuterol  3 mL Nebulization Q4H   isosorbide dinitrate  10 mg Oral TID   levETIRAcetam  500 mg Oral Daily   mouth rinse  15 mL Mouth Rinse q12n4p   multivitamin  1 tablet Oral QHS   sevelamer carbonate  1,600 mg Oral TID WC   Continuous Infusions:  sodium chloride     sodium chloride     azithromycin Stopped (05/20/21 0723)   cefTRIAXone (ROCEPHIN)  IV Stopped (05/20/21 0622)   PRN Meds: sodium chloride, sodium chloride, acetaminophen **OR** acetaminophen, alteplase, heparin, lidocaine (PF), lidocaine-prilocaine, pentafluoroprop-tetrafluoroeth   Vital Signs    Vitals:   05/20/21 0819 05/20/21 0900 05/20/21 1000 05/20/21 1050  BP:  121/65 127/67 109/65  Pulse:  61 65   Resp:  17 (!) 24   Temp:    98.4  F (36.9 C)  TempSrc:    Oral  SpO2: 98% 100% (!) 75%   Weight:      Height:        Intake/Output Summary (Last 24 hours) at 05/20/2021 1104 Last data filed at 05/20/2021 0800 Gross per 24 hour  Intake 587.03 ml  Output 0 ml  Net 587.03 ml   Last 3 Weights 05/20/2021 05/19/2021 05/18/2021  Weight (lbs) 130 lb 1.1 oz 124 lb 5.4 oz 129 lb 13.6 oz  Weight (kg) 59 kg 56.4 kg 58.9 kg      Telemetry    NSR, 60s - Personally Reviewed  ECG    No new tracings - Personally Reviewed  Physical Exam   GEN: No acute distress.  Cachectic appearing male, appears older than stated age.  Currently undergoing hemodialysis. Neck: +JVD Cardiac: RRR, 1/6 systolic murmur, rubs, or gallops.  Respiratory: Reduced bilateral breath sounds GI: Soft, nontender, non-distended  MS: no edema of bilateral amputated lower extremities s/p BKA Musculoskeletal: S/p BKA Neuro:  Nonfocal  Psych: Normal affect, somnolent  Labs    High Sensitivity Troponin:   Recent Labs  Lab 05/12/21 1928 05/12/21 2103 05/18/21 0143 05/18/21 0336  TROPONINIHS 32* 33* 34* 30*  Chemistry Recent Labs  Lab 05/18/21 0143 05/18/21 0547 05/19/21 0405 05/20/21 0437  NA 134*  --  137 130*  K 4.6  --  5.3* 5.6*  CL 95*  --  97* 94*  CO2 27  --  26 25  GLUCOSE 183*  --  200* 329*  BUN 49*  --  60* 88*  CREATININE 6.30* 6.48* 6.88* 7.81*  CALCIUM 8.7*  --  8.5* 7.9*  GFRNONAA 10* 9* 9* 7*  ANIONGAP 12  --  14 11     Hematology Recent Labs  Lab 05/18/21 0547 05/19/21 0405 05/20/21 0437  WBC 6.9 6.1 9.2  RBC 3.10* 2.76* 2.24*  HGB 7.8* 7.2* 5.8*  HCT 25.9* 23.5* 19.0*  MCV 83.5 85.1 84.8  MCH 25.2* 26.1 25.9*  MCHC 30.1 30.6 30.5  RDW 20.5* 20.3* 19.9*  PLT 123* 93* 122*    BNP Recent Labs  Lab 05/18/21 0143  BNP >4,500.0*     DDimer  Recent Labs  Lab 05/19/21 1346  DDIMER 1.22*     Radiology    CT Angio Chest Pulmonary Embolism (PE) W or WO Contrast  Result Date: 05/20/2021 CLINICAL  DATA:  Shortness of breath.  PE suspected, high prob EXAM: CT ANGIOGRAPHY CHEST WITH CONTRAST TECHNIQUE: Multidetector CT imaging of the chest was performed using the standard protocol during bolus administration of intravenous contrast. Multiplanar CT image reconstructions and MIPs were obtained to evaluate the vascular anatomy. CONTRAST:  63m OMNIPAQUE IOHEXOL 350 MG/ML SOLN COMPARISON:  05/18/2021 FINDINGS: Cardiovascular: No filling defects in the pulmonary arteries to suggest pulmonary emboli. Extensive coronary artery calcifications and scattered aortic calcifications. No aneurysm. Heart is mildly enlarged. Mediastinum/Nodes: Mildly prominent mediastinal lymph nodes, similar to prior study. Esophageal wall thickening again noted in the mid esophagus. Trachea and thyroid unremarkable. Lungs/Pleura: Small bilateral pleural effusions, similar prior study. Bilateral lower lobe airspace opacities, with some improvement in the left lower lobe since prior study. Pleural calcifications in the right lower hemithorax, stable. Upper Abdomen: Imaging into the upper abdomen demonstrates no acute findings. Musculoskeletal: Chest wall soft tissues are unremarkable. No acute bony abnormality. Review of the MIP images confirms the above findings. IMPRESSION: No evidence of pulmonary embolus. Cardiomegaly, coronary artery disease. Small bilateral effusions. Bilateral lower lobe airspace opacities with some improvement on the left since prior study. Findings could reflect edema or infection. Aortic Atherosclerosis (ICD10-I70.0). Electronically Signed   By: KRolm BaptiseM.D.   On: 05/20/2021 08:37   DG Chest Port 1 View  Result Date: 05/18/2021 CLINICAL DATA:  Respiratory distress. Recent left-sided thoracentesis. EXAM: PORTABLE CHEST 1 VIEW COMPARISON:  05/18/2021 FINDINGS: Portable semi upright view of the chest was obtained. Grossly stable cardiomediastinal contours. Aortic atherosclerosis. Moderate-sized right-sided  pleural effusion with extensive airspace opacities within the right lung. Diffuse interstitial opacities throughout the left lung with slight improved aeration of the left lung base. No pneumothorax. Vascular stents again seen in the left axillary and brachiocephalic regions. IMPRESSION: 1. Slight improved aeration of the left lung base post thoracentesis. No pneumothorax. 2. Otherwise stable chest with multifocal opacities and moderate right-sided pleural effusion. Electronically Signed   By: NDavina PokeD.O.   On: 05/18/2021 15:37   DG Chest Port 1 View  Result Date: 05/18/2021 CLINICAL DATA:  Status post left thoracentesis EXAM: PORTABLE CHEST 1 VIEW COMPARISON:  05/18/2021, 1:54 a.m. FINDINGS: Interval reduction in volume of a previously seen large left pleural effusion, now small and layering, with improved aeration of the left lung.  Interval increase in volume of right-sided pleural effusion with increased consolidation of the right lung. Cardiomegaly. Left axillary and brachiocephalic stents. IMPRESSION: 1. Interval reduction in volume of a previously seen large left pleural effusion, now small and layering, with improved aeration of the left lung. No pneumothorax. 2. Interval increase in volume of right-sided pleural effusion with increased consolidation of the right lung. 3. Cardiomegaly. Electronically Signed   By: Eddie Candle M.D.   On: 05/18/2021 12:51   US THORACENTESIS ASP PLEURAL SPACE W/IMG GUIDE  Result Date: 05/18/2021 INDICATION: Shortness of breath. Left-sided pleural effusion. Request for diagnostic and therapeutic thoracentesis. EXAM: ULTRASOUND GUIDED LEFT THORACENTESIS MEDICATIONS: 1% plain lidocaine, 5 mL COMPLICATIONS: None immediate. PROCEDURE: An ultrasound guided thoracentesis was thoroughly discussed with the patient and questions answered. The benefits, risks, alternatives and complications were also discussed. The patient understands and wishes to proceed with the  procedure. Written consent was obtained. Ultrasound was performed to localize and mark an adequate pocket of fluid in the left chest. The area was then prepped and draped in the normal sterile fashion. 1% Lidocaine was used for local anesthesia. Under ultrasound guidance a 6 Fr Safe-T-Centesis catheter was introduced. Thoracentesis was performed. The catheter was removed and a dressing applied. FINDINGS: A total of approximately 1 L of clear, dark yellow fluid was removed. Samples were sent to the laboratory as requested by the clinical team. IMPRESSION: Successful ultrasound guided left thoracentesis yielding 1 L of pleural fluid. Read by: Ascencion Dike PA-C Electronically Signed   By: Jacqulynn Cadet M.D.   On: 05/18/2021 12:57    Cardiac Studies   Echo 05/13/21  1. Left ventricular ejection fraction, by estimation, is 30 to 35%. The  left ventricle has moderately decreased function. The left ventricle  demonstrates global hypokinesis. The left ventricular internal cavity size  was mildly dilated. There is mild  left ventricular hypertrophy. Left ventricular diastolic parameters are  consistent with Grade II diastolic dysfunction (pseudonormalization).  Elevated left atrial pressure.   2. Right ventricular systolic function is moderately reduced. The right  ventricular size is moderately enlarged. There is normal pulmonary artery  systolic pressure.   3. Left atrial size was severely dilated.   4. Large pleural effusion in the left lateral region.   5. The mitral valve is abnormal. Mild to moderate mitral valve  regurgitation. No evidence of mitral stenosis. Moderate mitral annular  calcification.   6. Tricuspid valve regurgitation is moderate.   7. The aortic valve is tricuspid. There is moderate calcification of the  aortic valve. There is moderate thickening of the aortic valve. Aortic  valve regurgitation is not visualized. Mild to moderate aortic valve  sclerosis/calcification is  present,  without any evidence of aortic stenosis.   8. The inferior vena cava is normal in size with <50% respiratory  variability, suggesting right atrial pressure of 8 mmHg.  Patient Profile     60 y.o. male with hx of ESRD on hemodialysis MWF, anemia of chronic disease, hypertension, hyperlipidemia, seizures, hidradenitis, vascular disease, s/p bilateral BKA documented history of DVT, h/o ICH/stroke, DM2, current marijuana use, and no previously known history of heart failure who is being seen today for the evaluation of acute systolic heart failure at the request of Dr. Priscella Mann.  Assessment & Plan    Acute systolic heart failure -No chest pain now or leading up to the admission.  SOB likely multifactorial in the setting of his significant volume overload / acute systolic heart failure, valvular dz,  ESRD on HD, significant anemia with Hgb of 6.9 at arrival, pleural effusions, +/- recent pneumonia.  Etiology of reduced EF not yet known and ischemic heart dz must be considered. RF for coronary insufficiency / ischemic CM include male, age, DM2, known vascular disease. At this time, he is a poor candidate for further ischemic work-up with cath given his anemia / breathing status (comorbids).  CTA negative for PE.  Recommend ongoing HD for ongoing fluid management.  Optimization of medical management recommended as well with escalation of GDMT.  He was started on Bidil yesterday. Weaning clonidine today to prevent hypotension and amlodipine has been discontinued.  Caution with ASA given significant anemia and until anemia work-up completed though do suspect some element of anemia of chronic dz. Continue Coreg 54m BID, statin. Will defer to nephrology regarding addition of ARB.     Prolonged QTC -- Avoid QTC prolonging medications.  Ensure electrolytes at goal.  Continue to monitor with periodic EKGs.  Respiratory failure -- CT negative for PE. As above, shortness of breath likely multifactorial  in the setting of his significant volume overload / acute systolic heart failure, valvular dz, ESRD on HD, significant anemia with Hgb of 6.9 at arrival, pleural effusions, +/- recent pneumonia.    Hypertension -- Improved with HD. Continue to titrate medications / continue HD as needed with goal BP 130/80 or lower.  Continue Bidil, added yesterday. Wean clonidine today to prevent hypotension and discontinued amlodipine. Addition of ARB per nephrology.  ESRD on HD -- Continue hemodialysis on Monday, Wednesday, and Friday.   Hyperlipidemia --Continue statin.  Will repeat lipids.   DM2 --Glucose elevated at 329. Optimization of glycemic control with SSI, per IM  For questions or updates, please contact [place] consult [email or web address] for contact info under        Signed, JArvil Chaco PA-C  05/20/2021, 11:04 AM

## 2021-05-20 NOTE — Progress Notes (Signed)
Pt HD ended at 1420. Pt is alert, no c/o and is stable. Sites currently being held. AVF positive for thrill and bruit post HD. 1546m uf goal met. 1uprbc transfused with HD. Report to primary RN. Post HD hgb drawn.

## 2021-05-20 NOTE — Progress Notes (Signed)
Pre HD RN assessment

## 2021-05-20 NOTE — Progress Notes (Signed)
HD start

## 2021-05-20 NOTE — Progress Notes (Signed)
[facility]  ROUNDING NOTE   Subjective:   Patient states he is breathing better this morning.  Getting nebulizer treatment this morning.   Hgb 5.8 - PRBC transfusion ordered.   CTA of chest this morning. Negative for PE.   Furosemide discontinued yesterday.    Objective:  Vital signs in last 24 hours:  Temp:  [98 F (36.7 C)] 98 F (36.7 C) (08/03 0100) Pulse Rate:  [58-70] 58 (08/03 0500) Resp:  [8-20] 13 (08/03 0500) BP: (109-144)/(48-68) 110/60 (08/03 0500) SpO2:  [92 %-100 %] 98 % (08/03 0819) Weight:  [59 kg] 59 kg (08/03 0500)  Weight change: 2.3 kg Filed Weights   05/18/21 1507 05/19/21 0414 05/20/21 0500  Weight: 58.9 kg 56.4 kg 59 kg    Intake/Output: No intake/output data recorded.   Intake/Output this shift:  No intake/output data recorded.  Physical Exam: General: NAD, laying in bed  Head: Normocephalic, atraumatic. Moist oral mucosal membranes  Eyes: Anicteric  Lungs:  Clear, 2L [place] O2  Heart: Regular rate and rhythm  Abdomen:  Soft, nontender  Extremities:  Bilateral BKA  Neurologic: Alert, and oriented  Skin: No lesions  Access: LUE AVF    Basic Metabolic Panel: Recent Labs  Lab 05/15/21 0649 05/18/21 0143 05/18/21 0547 05/19/21 0405 05/20/21 0437  NA 136 134*  --  137 130*  K 4.0 4.6  --  5.3* 5.6*  CL 95* 95*  --  97* 94*  CO2 28 27  --  26 25  GLUCOSE 204* 183*  --  200* 329*  BUN 40* 49*  --  60* 88*  CREATININE 5.29* 6.30* 6.48* 6.88* 7.81*  CALCIUM 8.8* 8.7*  --  8.5* 7.9*  PHOS  --   --   --  6.9*  --      Liver Function Tests: No results for input(s): AST, ALT, ALKPHOS, BILITOT, PROT, ALBUMIN in the last 168 hours.  No results for input(s): LIPASE, AMYLASE in the last 168 hours. No results for input(s): AMMONIA in the last 168 hours.  CBC: Recent Labs  Lab 05/18/21 0143 05/18/21 0547 05/19/21 0405 05/20/21 0437  WBC 6.1 6.9 6.1 9.2  NEUTROABS 4.5  --  5.2 6.9  HGB 6.8* 7.8* 7.2* 5.8*  HCT 22.8*  25.9* 23.5* 19.0*  MCV 84.8 83.5 85.1 84.8  PLT 97* 123* 93* 122*     Cardiac Enzymes: No results for input(s): CKTOTAL, CKMB, CKMBINDEX, TROPONINI in the last 168 hours.  BNP: Invalid input(s): POCBNP  CBG: Recent Labs  Lab 05/19/21 1151 05/19/21 1648 05/19/21 1922 05/19/21 1927 05/20/21 0740  GLUCAP 314* 309* 278* 257* 284*     Microbiology: Results for orders placed or performed during the hospital encounter of 05/18/21  Resp Panel by RT-PCR (Flu A&B, Covid) Nasopharyngeal Swab     Status: None   Collection Time: 05/18/21  1:43 AM   Specimen: Nasopharyngeal Swab; Nasopharyngeal(NP) swabs in vial transport medium  Result Value Ref Range Status   SARS Coronavirus 2 by RT PCR NEGATIVE NEGATIVE Final    Comment: (NOTE) SARS-CoV-2 target nucleic acids are NOT DETECTED.  The SARS-CoV-2 RNA is generally detectable in upper respiratory specimens during the acute phase of infection. The lowest concentration of SARS-CoV-2 viral copies this assay can detect is 138 copies/mL. A negative result does not preclude SARS-Cov-2 infection and should not be used as the sole basis for treatment or other patient management decisions. A negative result may occur with  improper specimen collection/handling, submission of  specimen other than nasopharyngeal swab, presence of viral mutation(s) within the areas targeted by this assay, and inadequate number of viral copies(<138 copies/mL). A negative result must be combined with clinical observations, patient history, and epidemiological information. The expected result is Negative.  Fact Sheet for Patients:  EntrepreneurPulse.com.au  Fact Sheet for Healthcare Providers:  IncredibleEmployment.be  This test is no t yet approved or cleared by the [place] FDA and  has been authorized for detection and/or diagnosis of SARS-CoV-2 by FDA under an Emergency Use Authorization (EUA). This EUA will remain   in effect (meaning this test can be used) for the duration of the COVID-19 declaration under Section 564(b)(1) of the Act, 21 U.S.C.section 360bbb-3(b)(1), unless the authorization is terminated  or revoked sooner.       Influenza A by PCR NEGATIVE NEGATIVE Final   Influenza B by PCR NEGATIVE NEGATIVE Final    Comment: (NOTE) The Xpert Xpress SARS-CoV-2/FLU/RSV plus assay is intended as an aid in the diagnosis of influenza from Nasopharyngeal swab specimens and should not be used as a sole basis for treatment. Nasal washings and aspirates are unacceptable for Xpert Xpress SARS-CoV-2/FLU/RSV testing.  Fact Sheet for Patients: EntrepreneurPulse.com.au  Fact Sheet for Healthcare Providers: IncredibleEmployment.be  This test is not yet approved or cleared by the [place] FDA and has been authorized for detection and/or diagnosis of SARS-CoV-2 by FDA under an Emergency Use Authorization (EUA). This EUA will remain in effect (meaning this test can be used) for the duration of the COVID-19 declaration under Section 564(b)(1) of the Act, 21 U.S.C. section 360bbb-3(b)(1), unless the authorization is terminated or revoked.  Performed at [facility], [address] [place], [place] [address]   Blood culture (routine x 2)     Status: None (Preliminary result)   Collection Time: 05/18/21  1:44 AM   Specimen: BLOOD  Result Value Ref Range Status   Specimen Description BLOOD RIGHT ANTECUBITAL  Final   Special Requests   Final    BOTTLES DRAWN AEROBIC AND ANAEROBIC Blood Culture adequate volume   Culture   Final    NO GROWTH 1 DAY Performed at [facility], [address], [place], [place] [address]    Report Status PENDING  Incomplete  Blood culture (routine x 2)     Status: None (Preliminary result)   Collection Time: 05/18/21  1:44 AM   Specimen: BLOOD  Result Value Ref Range Status   Specimen Description BLOOD BLOOD  RIGHT FOREARM  Final   Special Requests   Final    BOTTLES DRAWN AEROBIC AND ANAEROBIC Blood Culture adequate volume   Culture   Final    NO GROWTH 1 DAY Performed at [facility], [address], [place], [place] [address]    Report Status PENDING  Incomplete  Acid Fast Smear (AFB)     Status: None   Collection Time: 05/18/21 11:25 AM   Specimen: PATH Cytology Pleural fluid  Result Value Ref Range Status   AFB Specimen Processing Concentration  Final   Acid Fast Smear Negative  Final    Comment: (NOTE) Performed At: [facility], [place] [id number] Rush Farmer MD UX:[id number]    Source (AFB) PLEURAL  Final    Comment: Performed at [facility], [address] [place],  [address]  Body fluid culture w Gram Stain     Status: None (Preliminary result)   Collection Time: 05/18/21 11:25 AM   Specimen: PATH Cytology  Pleural fluid  Result Value Ref Range Status   Specimen Description   Final    PLEURAL Performed at [facility], [address] [place], [place] [address]    Special Requests   Final    NONE Performed at [facility], [address] [place], [place] [address]    Gram Stain   Final    RARE WBC PRESENT, PREDOMINANTLY MONONUCLEAR NO ORGANISMS SEEN    Culture   Final    NO GROWTH < 24 HOURS Performed at [facility], [place], [place], [place] [address]    Report Status PENDING  Incomplete    Coagulation Studies: No results for input(s): LABPROT, INR in the last 72 hours.  Urinalysis: No results for input(s): COLORURINE, LABSPEC, PHURINE, GLUCOSEU, HGBUR, BILIRUBINUR, KETONESUR, PROTEINUR, UROBILINOGEN, NITRITE, LEUKOCYTESUR in the last 72 hours.  Invalid input(s): APPERANCEUR    Imaging: CT Angio Chest Pulmonary Embolism (PE) W or WO Contrast  Result Date: 05/20/2021 CLINICAL DATA:  Shortness of breath.  PE suspected, high prob EXAM: CT ANGIOGRAPHY CHEST WITH  CONTRAST TECHNIQUE: Multidetector CT imaging of the chest was performed using the standard protocol during bolus administration of intravenous contrast. Multiplanar CT image reconstructions and MIPs were obtained to evaluate the vascular anatomy. CONTRAST:  74m OMNIPAQUE IOHEXOL 350 MG/ML SOLN COMPARISON:  05/18/2021 FINDINGS: Cardiovascular: No filling defects in the pulmonary arteries to suggest pulmonary emboli. Extensive coronary artery calcifications and scattered aortic calcifications. No aneurysm. Heart is mildly enlarged. Mediastinum/Nodes: Mildly prominent mediastinal lymph nodes, similar to prior study. Esophageal wall thickening again noted in the mid esophagus. Trachea and thyroid unremarkable. Lungs/Pleura: Small bilateral pleural effusions, similar prior study. Bilateral lower lobe airspace opacities, with some improvement in the left lower lobe since prior study. Pleural calcifications in the right lower hemithorax, stable. Upper Abdomen: Imaging into the upper abdomen demonstrates no acute findings. Musculoskeletal: Chest wall soft tissues are unremarkable. No acute bony abnormality. Review of the MIP images confirms the above findings. IMPRESSION: No evidence of pulmonary embolus. Cardiomegaly, coronary artery disease. Small bilateral effusions. Bilateral lower lobe airspace opacities with some improvement on the left since prior study. Findings could reflect edema or infection. Aortic Atherosclerosis (ICD10-I70.0). Electronically Signed   By: KRolm BaptiseM.D.   On: 05/20/2021 08:37   DG Chest Port 1 View  Result Date: 05/18/2021 CLINICAL DATA:  Respiratory distress. Recent left-sided thoracentesis. EXAM: PORTABLE CHEST 1 VIEW COMPARISON:  05/18/2021 FINDINGS: Portable semi upright view of the chest was obtained. Grossly stable cardiomediastinal contours. Aortic atherosclerosis. Moderate-sized right-sided pleural effusion with extensive airspace opacities within the right lung. Diffuse  interstitial opacities throughout the left lung with slight improved aeration of the left lung base. No pneumothorax. Vascular stents again seen in the left axillary and brachiocephalic regions. IMPRESSION: 1. Slight improved aeration of the left lung base post thoracentesis. No pneumothorax. 2. Otherwise stable chest with multifocal opacities and moderate right-sided pleural effusion. Electronically Signed   By: NDavina PokeD.O.   On: 05/18/2021 15:37   DG Chest Port 1 View  Result Date: 05/18/2021 CLINICAL DATA:  Status post left thoracentesis EXAM: PORTABLE CHEST 1 VIEW COMPARISON:  05/18/2021, 1:54 a.m. FINDINGS: Interval reduction in volume of a previously seen large left pleural effusion, now small and layering, with improved aeration of the left lung. Interval increase in volume of right-sided pleural effusion with increased consolidation of the right lung. Cardiomegaly. Left axillary and brachiocephalic stents. IMPRESSION: 1. Interval reduction in volume of a  previously seen large left pleural effusion, now small and layering, with improved aeration of the left lung. No pneumothorax. 2. Interval increase in volume of right-sided pleural effusion with increased consolidation of the right lung. 3. Cardiomegaly. Electronically Signed   By: Eddie Candle M.D.   On: 05/18/2021 12:51   US THORACENTESIS ASP PLEURAL SPACE W/IMG GUIDE  Result Date: 05/18/2021 INDICATION: Shortness of breath. Left-sided pleural effusion. Request for diagnostic and therapeutic thoracentesis. EXAM: ULTRASOUND GUIDED LEFT THORACENTESIS MEDICATIONS: 1% plain lidocaine, 5 mL COMPLICATIONS: None immediate. PROCEDURE: An ultrasound guided thoracentesis was thoroughly discussed with the patient and questions answered. The benefits, risks, alternatives and complications were also discussed. The patient understands and wishes to proceed with the procedure. Written consent was obtained. Ultrasound was performed to localize and mark an  adequate pocket of fluid in the left chest. The area was then prepped and draped in the normal sterile fashion. 1% Lidocaine was used for local anesthesia. Under ultrasound guidance a 6 Fr Safe-T-Centesis catheter was introduced. Thoracentesis was performed. The catheter was removed and a dressing applied. FINDINGS: A total of approximately 1 L of clear, dark yellow fluid was removed. Samples were sent to the laboratory as requested by the clinical team. IMPRESSION: Successful ultrasound guided left thoracentesis yielding 1 L of pleural fluid. Read by: Ascencion Dike PA-C Electronically Signed   By: Jacqulynn Cadet M.D.   On: 05/18/2021 12:57     Medications:    azithromycin 500 mg (05/20/21 4163)   cefTRIAXone (ROCEPHIN)  IV 1 g (05/20/21 0551)    sodium chloride   Intravenous Once   sodium chloride   Intravenous Once   amLODipine  10 mg Oral Daily   arformoterol  15 mcg Nebulization BID   vitamin C  500 mg Oral BID   aspirin  81 mg Oral Daily   atorvastatin  80 mg Oral Daily   budesonide (PULMICORT) nebulizer solution  0.25 mg Nebulization BID   carvedilol  25 mg Oral BID   chlorhexidine  15 mL Mouth Rinse BID   Chlorhexidine Gluconate Cloth  6 each Topical Q0600   Chlorhexidine Gluconate Cloth  6 each Topical Q0600   cloNIDine  0.2 mg Oral BID   epoetin (EPOGEN/PROCRIT) injection  10,000 Units Intravenous Q M,W,F-HD   feeding supplement (NEPRO CARB STEADY)  237 mL Oral TID BM   ferric citrate  420 mg Oral TID WC   gabapentin  400 mg Oral QHS   heparin  5,000 Units Subcutaneous Q8H   hydrALAZINE  100 mg Oral Q8H   insulin aspart  0-5 Units Subcutaneous QHS   insulin aspart  0-6 Units Subcutaneous TID WC   ipratropium-albuterol  3 mL Nebulization Q4H   isosorbide dinitrate  10 mg Oral TID   levETIRAcetam  500 mg Oral Daily   mouth rinse  15 mL Mouth Rinse q12n4p   multivitamin  1 tablet Oral QHS   sevelamer carbonate  1,600 mg Oral TID WC   acetaminophen **OR**  acetaminophen  Assessment/ Plan:  Mr. Markail Diekman Ganesh is a 60 y.o. black male with end stage renal disease on hemodialysis, diabetes mellitus type II, hypertension, CVA, peripheral vascular disease status post bilateral BKA and ESRD on dialysis. Patient presented to ED with shortness of breath and cough. He has been admitted to [facility] for SOB (shortness of breath) [R06.02] Pleural effusion [J90] Healthcare-associated pneumonia [J18.9] Acute respiratory failure with hypoxia ([place]) [J96.01] Acute on chronic anemia [D64.9] Acute decompensated heart failure ([place]) [I50.9]  [facility]/MWF/LUE AVF  End stage renal disease on dialysis  - Dialysis for today. Orders prepared. Ultrafiltration planned.   2. Acute respiratory failure: requiring oxygen. Status post left thoracentesis on 8/1. 1 Liter removed.  CT negative for PE this morning. Continues to show bilateral lower lobe opacities.  - Continue supportive care - Stress dose steroids, supplemental oxgyen, nebs - Appreciate pulmonology input.   3. Anemia of chronic kidney disease  Lab Results  Component Value Date   HGB 5.8 (L) 05/20/2021  EPO with HD treatments.  Scheduled PRBC transfusion during treatment today.    4. Secondary Hyperparathyroidism: with hyperphosphatemia Lab Results  Component Value Date   PTH 283 (H) 01/08/2019   CALCIUM 7.9 (L) 05/20/2021   CAION 1.10 (L) 09/12/2020   PHOS 6.9 (H) 05/19/2021  - Auryxia and sevelamer with meals.   5. Hypertension: 110/60. Current regimen of clonidine, amlodipine, carvedilol, and hydralazine.      LOS: 1 [place] 8/3/20228:46 AM

## 2021-05-20 NOTE — Progress Notes (Signed)
OT Cancellation Note  Patient Details Name: [name] MRN: [id number] DOB: 1960-11-30   Cancelled Treatment:    Reason Eval/Treat Not Completed: Medical issues which prohibited therapy Upon chart review this AM, noted that pt with Hgb of 5.8 and K+ trending up at 5.6 today versus 5.3 yesterday. Will f/u at later date/time as pt becomes more appropriate for activity. Thank you.  Gerrianne Scale, [place], OTR/L ascom [phone] 05/20/21, 8:42 AM

## 2021-05-20 NOTE — Progress Notes (Signed)
Inpatient Diabetes Program Recommendations  AACE/ADA: New Consensus Statement on Inpatient Glycemic Control (2015)  Target Ranges:  Prepandial:   less than 140 mg/dL      Peak postprandial:   less than 180 mg/dL (1-2 hours)      Critically ill patients:  140 - 180 mg/dL   Results for Jansson, LAREN WHALING (MRN [id number]) as of 05/20/2021 09:44  Ref. Range 05/19/2021 07:42 05/19/2021 11:51 05/19/2021 16:48 05/19/2021 19:22 05/19/2021 19:27  Glucose-Capillary Latest Ref Range: 70 - 99 mg/dL 218 (H)  2 units NOVOLOG  314 (H)  4 units NOVOLOG  309 (H)  4 units NOVOLOG  278 (H)  3 units NOVOLOG  257 (H)  Results for Glassco, CHIP CANEPA (MRN [id number]) as of 05/20/2021 09:44  Ref. Range 05/20/2021 07:40  Glucose-Capillary Latest Ref Range: 70 - 99 mg/dL 284 (H)  3 units NOVOLOG     Current Orders: Novolog 0-6 units TID ac/hs    Pt got 125 mg Solumedrol X 1 dose 4pm on 08/01.    CBGs >200 all day yest   MD- Note CBG 284 this AM  Please consider:  1. Start Levemir 8 units Daily (0.15 units/kg)  2. Increase Novolog SSI to the 0-9 unit scale TID AC + HS    --Will follow patient during hospitalization--  Wyn Quaker RN, MSN, CDE Diabetes Coordinator Inpatient Glycemic Control Team Team Pager: [phone] (8a-5p)

## 2021-05-20 NOTE — Progress Notes (Signed)
PT Cancellation Note  Patient Details Name: SAAHIR PRUDE MRN: [id number] DOB: 05-18-1961   Cancelled Treatment:    Reason Eval/Treat Not Completed: Medical issues which prohibited therapy: Pt's Ka 5.6 and trending up, Hgb 5.8, both falling outside guidelines for participation with PT services.  Will attempt to see pt at a future date/time as medically appropriate.     Linus Salmons PT, DPT 05/20/21, 8:40 AM

## 2021-05-20 NOTE — Progress Notes (Signed)
HD end

## 2021-05-20 NOTE — Progress Notes (Signed)
Pre HD info

## 2021-05-20 NOTE — Progress Notes (Signed)
CRITICAL CARE PROGRESS NOTE    Name: [name] MRN: [id number] DOB: August 07, 1961     LOS: 1   SUBJECTIVE FINDINGS & SIGNIFICANT EVENTS    Patient description:  2 M with PMH of ESRD on MWF, htn, seizures, HFrEF, hidradenitis, came in due to acute on chronic hypoxemic respiratory failure.   In ER patient required HFNC at 10L. He had pleural effusion found on chest imaging and is s/p thoracentesis. PCCM consultation for acute respiratory distress with hypoxemia.    05/19/21- patient is improved, he is able to feed himself.  There is plan for HD with net removal of fluid.  Treating for CHF exacerbation with pleural effusion.  05/20/21- patient met with palliative team, there is goal of care discussion towards comfort measures.   Microbiology/Sepsis markers: Results for orders placed or performed during the hospital encounter of 05/18/21  Resp Panel by RT-PCR (Flu A&B, Covid) Nasopharyngeal Swab     Status: None   Collection Time: 05/18/21  1:43 AM   Specimen: Nasopharyngeal Swab; Nasopharyngeal(NP) swabs in vial transport medium  Result Value Ref Range Status   SARS Coronavirus 2 by RT PCR NEGATIVE NEGATIVE Final    Comment: (NOTE) SARS-CoV-2 target nucleic acids are NOT DETECTED.  The SARS-CoV-2 RNA is generally detectable in upper respiratory specimens during the acute phase of infection. The lowest concentration of SARS-CoV-2 viral copies this assay can detect is 138 copies/mL. A negative result does not preclude SARS-Cov-2 infection and should not be used as the sole basis for treatment or other patient management decisions. A negative result may occur with  improper specimen collection/handling, submission of specimen other than nasopharyngeal swab, presence of viral mutation(s) within the areas targeted  by this assay, and inadequate number of viral copies(<138 copies/mL). A negative result must be combined with clinical observations, patient history, and epidemiological information. The expected result is Negative.  Fact Sheet for Patients:  EntrepreneurPulse.com.au  Fact Sheet for Healthcare Providers:  IncredibleEmployment.be  This test is no t yet approved or cleared by the [place] FDA and  has been authorized for detection and/or diagnosis of SARS-CoV-2 by FDA under an Emergency Use Authorization (EUA). This EUA will remain  in effect (meaning this test can be used) for the duration of the COVID-19 declaration under Section 564(b)(1) of the Act, 21 U.S.C.section 360bbb-3(b)(1), unless the authorization is terminated  or revoked sooner.       Influenza A by PCR NEGATIVE NEGATIVE Final   Influenza B by PCR NEGATIVE NEGATIVE Final    Comment: (NOTE) The Xpert Xpress SARS-CoV-2/FLU/RSV plus assay is intended as an aid in the diagnosis of influenza from Nasopharyngeal swab specimens and should not be used as a sole basis for treatment. Nasal washings and aspirates are unacceptable for Xpert Xpress SARS-CoV-2/FLU/RSV testing.  Fact Sheet for Patients: EntrepreneurPulse.com.au  Fact Sheet for Healthcare Providers: IncredibleEmployment.be  This test is not yet approved or cleared by the [place] FDA and has been authorized for detection and/or diagnosis of SARS-CoV-2 by FDA under an Emergency Use Authorization (EUA). This EUA will remain in effect (meaning this test can be used) for the duration of the COVID-19 declaration under Section 564(b)(1) of the Act, 21 U.S.C. section 360bbb-3(b)(1), unless the authorization is terminated or revoked.  Performed at [facility], [address] [place],  [address]   Blood culture (routine x 2)     Status: None (Preliminary result)    Collection Time: 05/18/21  1:44 AM  Specimen: BLOOD  Result Value Ref Range Status   Specimen Description BLOOD RIGHT ANTECUBITAL  Final   Special Requests   Final    BOTTLES DRAWN AEROBIC AND ANAEROBIC Blood Culture adequate volume   Culture   Final    NO GROWTH 2 DAYS Performed at [facility], [address] [place], [place] [address]    Report Status PENDING  Incomplete  Blood culture (routine x 2)     Status: None (Preliminary result)   Collection Time: 05/18/21  1:44 AM   Specimen: BLOOD  Result Value Ref Range Status   Specimen Description BLOOD BLOOD RIGHT FOREARM  Final   Special Requests   Final    BOTTLES DRAWN AEROBIC AND ANAEROBIC Blood Culture adequate volume   Culture   Final    NO GROWTH 2 DAYS Performed at [facility], [address] [place], [place] [address]    Report Status PENDING  Incomplete  Acid Fast Smear (AFB)     Status: None   Collection Time: 05/18/21 11:25 AM   Specimen: PATH Cytology Pleural fluid  Result Value Ref Range Status   AFB Specimen Processing Concentration  Final   Acid Fast Smear Negative  Final    Comment: (NOTE) Performed At: [facility] [place], [place] [id number] Rush Farmer MD DX:[id number]    Source (AFB) PLEURAL  Final    Comment: Performed at [facility], [address] [place], [place] [address]  Body fluid culture w Gram Stain     Status: None (Preliminary result)   Collection Time: 05/18/21 11:25 AM   Specimen: PATH Cytology Pleural fluid  Result Value Ref Range Status   Specimen Description   Final    PLEURAL Performed at [facility], [address], [place], [place] [address]    Special Requests   Final    NONE Performed at [facility], [address] [place], [place] [address]    Gram Stain   Final    RARE WBC PRESENT, PREDOMINANTLY MONONUCLEAR NO ORGANISMS SEEN    Culture   Final    NO GROWTH 2 DAYS Performed at [facility], [place] [address] [place],  [address]    Report Status PENDING  Incomplete  Respiratory (~20 pathogens) panel by PCR     Status: None   Collection Time: 05/20/21  7:41 AM   Specimen: Nasopharyngeal Swab; Respiratory  Result Value Ref Range Status   Adenovirus NOT DETECTED NOT DETECTED Final   Coronavirus 229E NOT DETECTED NOT DETECTED Final    Comment: (NOTE) The Coronavirus on the Respiratory Panel, DOES NOT test for the novel  Coronavirus (2019 nCoV)    Coronavirus HKU1 NOT DETECTED NOT DETECTED Final   Coronavirus NL63 NOT DETECTED NOT DETECTED Final   Coronavirus OC43 NOT DETECTED NOT DETECTED Final   Metapneumovirus NOT DETECTED NOT DETECTED Final   Rhinovirus / Enterovirus NOT DETECTED NOT DETECTED Final   Influenza A NOT DETECTED NOT DETECTED Final   Influenza B NOT DETECTED NOT DETECTED Final   Parainfluenza Virus 1 NOT DETECTED NOT DETECTED Final   Parainfluenza Virus 2 NOT DETECTED NOT DETECTED Final   Parainfluenza Virus 3 NOT DETECTED NOT DETECTED Final   Parainfluenza Virus 4 NOT DETECTED NOT DETECTED Final   Respiratory Syncytial Virus NOT DETECTED NOT DETECTED Final   Bordetella pertussis NOT DETECTED NOT DETECTED Final   Bordetella Parapertussis NOT DETECTED NOT DETECTED Final   Chlamydophila pneumoniae NOT DETECTED  NOT DETECTED Final   Mycoplasma pneumoniae NOT DETECTED NOT DETECTED Final    Comment: Performed at [facility], [place] [address], [place],  [address]    Anti-infectives:  Anti-infectives (From admission, onward)    Start     Dose/Rate Route Frequency Ordered Stop   05/20/21 0600  levofloxacin (LEVAQUIN) IVPB 500 mg  Status:  Discontinued       See Hyperspace for full Linked Orders Report.   500 mg 100 mL/hr over 60 Minutes Intravenous Every 48 hours 05/18/21 0558 05/18/21 0601   05/20/21 0600  cefTRIAXone (ROCEPHIN) 1 g in sodium chloride 0.9 % 100 mL IVPB        1 g 200 mL/hr over 30 Minutes Intravenous Every 24  hours 05/18/21 1224     05/20/21 0600  azithromycin (ZITHROMAX) 500 mg in sodium chloride 0.9 % 250 mL IVPB        500 mg 250 mL/hr over 60 Minutes Intravenous Every 24 hours 05/18/21 1224     05/20/21 0400  levofloxacin (LEVAQUIN) IVPB 500 mg  Status:  Discontinued        500 mg 100 mL/hr over 60 Minutes Intravenous Every 48 hours 05/18/21 0602 05/18/21 1224   05/18/21 0600  levofloxacin (LEVAQUIN) IVPB 750 mg  Status:  Discontinued       See Hyperspace for full Linked Orders Report.   750 mg 100 mL/hr over 90 Minutes Intravenous  Once 05/18/21 0558 05/18/21 0601   05/18/21 0345  levofloxacin (LEVAQUIN) IVPB 750 mg        750 mg 100 mL/hr over 90 Minutes Intravenous  Once 05/18/21 0335 05/18/21 0526        Consults: Treatment Team:  Ottie Glazier, MD End, Harrell Gave, MD     PAST MEDICAL HISTORY   Past Medical History:  Diagnosis Date   Acute metabolic encephalopathy 11/23/3333   Anemia    Crohn disease ([place])    Diabetes mellitus without complication ([place])    DVT of lower extremity (deep venous thrombosis) ([place]) 2016   Empyema ([place]) 05/20/2017   Encephalopathy 12/04/2017   Fall at home, initial encounter 09/12/2020   Hidradenitis suppurativa    Hypertension    ICH (intracerebral hemorrhage) ([place])    Peritonitis ([place]) 04/21/2017   Pyogenic arthritis of knee ([place]) 02/04/2016   Renal disorder    Sepsis ([place]) 01/12/2018   Stroke [facility])      SURGICAL HISTORY   Past Surgical History:  Procedure Laterality Date   A/V FISTULAGRAM Left 02/24/2021   Procedure: A/V FISTULAGRAM;  Surgeon: Katha Cabal, MD;  Location: [place] [facility];  Service: Cardiovascular;  Laterality: Left;   ABDOMINAL SURGERY     AMPUTATION FINGER Left 06/2019   PR AMPUTATION LONG FINGER/THUMB+FLAPS [facility]   ANGIOPLASTY Left    left fem-pop [facility] 04-11-2018   BELOW KNEE LEG AMPUTATION Right 08/2017   [facility]   COLONOSCOPY     COLONOSCOPY WITH PROPOFOL N/A 10/28/2020   Procedure: COLONOSCOPY WITH  PROPOFOL;  Surgeon: Lin Landsman, MD;  Location: [place];  Service: Gastroenterology;  Laterality: N/A;   COLONOSCOPY WITH PROPOFOL N/A 11/21/2020   Procedure: COLONOSCOPY WITH PROPOFOL;  Surgeon: [name], MD;  Location: [facility] ENDOSCOPY;  Service: Endoscopy;  Laterality: N/A;   DIALYSIS/PERMA CATHETER INSERTION N/A 12/09/2017   Procedure: DIALYSIS/PERMA CATHETER INSERTION;  Surgeon: Katha Cabal, MD;  Location: [place] [facility];  Service: Cardiovascular;  Laterality: N/A;   DIALYSIS/PERMA CATHETER INSERTION N/A 12/12/2017  Procedure: DIALYSIS/PERMA CATHETER INSERTION;  Surgeon: Algernon Huxley, MD;  Location: [place] [facility];  Service: Cardiovascular;  Laterality: N/A;   DIALYSIS/PERMA CATHETER REMOVAL Left 12/09/2017   Procedure: DIALYSIS/PERMA CATHETER REMOVAL;  Surgeon: Katha Cabal, MD;  Location: [place] [facility];  Service: Cardiovascular;  Laterality: Left;   ESOPHAGOGASTRODUODENOSCOPY (EGD) WITH PROPOFOL N/A 11/20/2020   Procedure: ESOPHAGOGASTRODUODENOSCOPY (EGD) WITH PROPOFOL;  Surgeon: [name], MD;  Location: [facility] ENDOSCOPY;  Service: Endoscopy;  Laterality: N/A;   KNEE SURGERY Left 02/04/2016   [facility]   LEG AMPUTATION THROUGH LOWER TIBIA AND FIBULA Left 06/22/2018   [facility]   LOWER EXTREMITY ANGIOGRAPHY Right 08/08/2017   Procedure: Lower Extremity Angiography;  Surgeon: Algernon Huxley, MD;  Location: [place] [facility];  Service: Cardiovascular;  Laterality: Right;   LOWER EXTREMITY ANGIOGRAPHY Right 08/22/2017   Procedure: Lower Extremity Angiography;  Surgeon: Algernon Huxley, MD;  Location: [place] [facility];  Service: Cardiovascular;  Laterality: Right;   LOWER EXTREMITY INTERVENTION  08/08/2017   Procedure: LOWER EXTREMITY INTERVENTION;  Surgeon: Algernon Huxley, MD;  Location: [place] [facility];  Service: Cardiovascular;;   LOWER EXTREMITY INTERVENTION  08/22/2017   Procedure: LOWER EXTREMITY INTERVENTION;  Surgeon: Algernon Huxley, MD;  Location:  [place] [facility];  Service: Cardiovascular;;     FAMILY HISTORY   Family History  Problem Relation Age of Onset   Irritable bowel syndrome Sister    Diabetes Sister    Heart disease Mother    Diabetes Mother    Heart disease Father    Rheumatic fever Father        as child   Psoriasis Brother    Arthritis Brother    Diabetes Sister    Diabetes Sister      SOCIAL HISTORY   Social History   Tobacco Use   Smoking status: Former   Smokeless tobacco: Never   Tobacco comments:    smokes marijuana  Vaping Use   Vaping Use: Never used  Substance Use Topics   Alcohol use: No   Drug use: Yes    Types: Marijuana     MEDICATIONS   Current Medication:  Current Facility-Administered Medications:    0.9 %  sodium chloride infusion (Manually program via Guardrails IV Fluids), , Intravenous, Once, Manuella Ghazi, Vipul, MD   0.9 %  sodium chloride infusion (Manually program via Guardrails IV Fluids), , Intravenous, Once, Kolluru, Sarath, MD   0.9 %  sodium chloride infusion, 100 mL, Intravenous, PRN, Breeze, Shantelle, NP   0.9 %  sodium chloride infusion, 100 mL, Intravenous, PRN, Gwyneth Revels, Shantelle, NP   acetaminophen (TYLENOL) tablet 650 mg, 650 mg, Oral, Q6H PRN, 650 mg at 05/20/21 0856 **OR** acetaminophen (TYLENOL) suppository 650 mg, 650 mg, Rectal, Q6H PRN, Rise Patience, MD   alteplase (CATHFLO ACTIVASE) injection 2 mg, 2 mg, Intracatheter, Once PRN, Colon Flattery, NP   arformoterol (BROVANA) nebulizer solution 15 mcg, 15 mcg, Nebulization, BID, Sreenath, Sudheer B, MD, 15 mcg at 05/19/21 2000   ascorbic acid (VITAMIN C) tablet 500 mg, 500 mg, Oral, BID, Sreenath, Sudheer B, MD, 500 mg at 05/20/21 0856   aspirin chewable tablet 81 mg, 81 mg, Oral, Daily, Rise Patience, MD, 81 mg at 05/20/21 0856   atorvastatin (LIPITOR) tablet 80 mg, 80 mg, Oral, Daily, Rise Patience, MD, 80 mg at 05/20/21 0855   azithromycin (ZITHROMAX) 500 mg in sodium chloride 0.9 %  250 mL IVPB, 500 mg, Intravenous, Q24H, Sreenath,  Trula Slade, MD, Stopped at 05/20/21 0723   budesonide (PULMICORT) nebulizer solution 0.25 mg, 0.25 mg, Nebulization, BID, Priscella Mann, Sudheer B, MD, 0.25 mg at 05/20/21 0817   carvedilol (COREG) tablet 25 mg, 25 mg, Oral, BID, Rise Patience, MD, 25 mg at 05/19/21 2036   cefTRIAXone (ROCEPHIN) 1 g in sodium chloride 0.9 % 100 mL IVPB, 1 g, Intravenous, Q24H, Sreenath, Sudheer B, MD, Stopped at 05/20/21 0622   chlorhexidine (PERIDEX) 0.12 % solution 15 mL, 15 mL, Mouth Rinse, BID, Lanney [name], Joshwa Hemric, MD, 15 mL at 05/20/21 0855   Chlorhexidine Gluconate Cloth 2 % PADS 6 each, 6 each, Topical, Q0600, Colon Flattery, NP, 6 each at 05/18/21 1522   Chlorhexidine Gluconate Cloth 2 % PADS 6 each, 6 each, Topical, Q0600, Ottie Glazier, MD, 6 each at 05/19/21 0500   cloNIDine (CATAPRES) tablet 0.2 mg, 0.2 mg, Oral, Daily, Agbor-Etang, Aaron Edelman, MD   epoetin alfa (EPOGEN) injection 10,000 Units, 10,000 Units, Intravenous, Q M,W,F-HD, Kolluru, Sarath, MD, 10,000 Units at 05/20/21 1328   feeding supplement (NEPRO CARB STEADY) liquid 237 mL, 237 mL, Oral, TID BM, Sreenath, Sudheer B, MD, 237 mL at 05/20/21 0855   ferric citrate (AURYXIA) tablet 420 mg, 420 mg, Oral, TID WC, Kolluru, Sarath, MD, 420 mg at 05/20/21 0857   gabapentin (NEURONTIN) capsule 400 mg, 400 mg, Oral, QHS, Rise Patience, MD, 400 mg at 05/19/21 2035   heparin injection 1,000 Units, 1,000 Units, Dialysis, PRN, Colon Flattery, NP   heparin injection 5,000 Units, 5,000 Units, Subcutaneous, Q8H, Rise Patience, MD, 5,000 Units at 05/20/21 1317   heparin sodium (porcine) 1000 UNIT/ML injection, , , ,    hydrALAZINE (APRESOLINE) tablet 50 mg, 50 mg, Oral, Q8H, Agbor-Etang, Brian, MD   insulin aspart (novoLOG) injection 0-5 Units, 0-5 Units, Subcutaneous, QHS, Manuella Ghazi, Vipul, MD   insulin aspart (novoLOG) injection 0-9 Units, 0-9 Units, Subcutaneous, TID WC, Max Sane, MD, 3 Units at  05/20/21 1317   insulin detemir (LEVEMIR) injection 8 Units, 8 Units, Subcutaneous, Daily, Manuella Ghazi, Vipul, MD   ipratropium-albuterol (DUONEB) 0.5-2.5 (3) MG/3ML nebulizer solution 3 mL, 3 mL, Nebulization, BID, Lanney [name], Jacobus Colvin, MD   isosorbide dinitrate (ISORDIL) tablet 10 mg, 10 mg, Oral, TID, Agbor-Etang, Aaron Edelman, MD   levETIRAcetam (KEPPRA) tablet 500 mg, 500 mg, Oral, Daily, Rise Patience, MD, 500 mg at 05/19/21 2038   lidocaine (PF) (XYLOCAINE) 1 % injection 5 mL, 5 mL, Intradermal, PRN, Breeze, Shantelle, NP   lidocaine-prilocaine (EMLA) cream 1 application, 1 application, Topical, PRN, Colon Flattery, NP   MEDLINE mouth rinse, 15 mL, Mouth Rinse, q12n4p, Lanney [name], Albino Bufford, MD, 15 mL at 05/20/21 1318   multivitamin (RENA-VIT) tablet 1 tablet, 1 tablet, Oral, QHS, Rise Patience, MD, 1 tablet at 05/19/21 2035   pentafluoroprop-tetrafluoroeth (GEBAUERS) aerosol 1 application, 1 application, Topical, PRN, Colon Flattery, NP   sevelamer carbonate (RENVELA) tablet 1,600 mg, 1,600 mg, Oral, TID WC, Kolluru, Sarath, MD, 1,600 mg at 05/20/21 0900    ALLERGIES   Methotrexate, Vancomycin, Cefepime, and Tape    REVIEW OF SYSTEMS   10 point ROS done and is negative except patient has complaints of loss of appetite    PHYSICAL EXAMINATION   Vital Signs: Temp:  [97.9 F (36.6 C)-98.4 F (36.9 C)] 98.3 F (36.8 C) (08/03 1420) Pulse Rate:  [58-72] 71 (08/03 1500) Resp:  [8-24] 22 (08/03 1500) BP: (104-157)/(48-74) 153/71 (08/03 1500) SpO2:  [75 %-100 %] 97 % (08/03 1500) Weight:  [59 kg] 59 kg (08/03 0500)  GENERAL:older then stated age , mild distress HEAD: Normocephalic, atraumatic.  EYES: Pupils equal, round, reactive to light.  No scleral icterus.  MOUTH: Moist mucosal membrane. NECK: Supple. No thyromegaly. No nodules. No JVD.  PULMONARY: rhonchi bilaterally  CARDIOVASCULAR: S1 and S2. Regular rate and rhythm. No murmurs, rubs, or gallops.  GASTROINTESTINAL:  Soft, nontender, non-distended. No masses. Positive bowel sounds. No hepatosplenomegaly.  MUSCULOSKELETAL: No swelling, clubbing, or edema. Bilaterl BKA amputation NEUROLOGIC: Mild distress due to acute illness SKIN:intact,warm,dry   PERTINENT DATA     Infusions:  sodium chloride     sodium chloride     azithromycin Stopped (05/20/21 0723)   cefTRIAXone (ROCEPHIN)  IV Stopped (05/20/21 0622)   Scheduled Medications:  sodium chloride   Intravenous Once   sodium chloride   Intravenous Once   arformoterol  15 mcg Nebulization BID   vitamin C  500 mg Oral BID   aspirin  81 mg Oral Daily   atorvastatin  80 mg Oral Daily   budesonide (PULMICORT) nebulizer solution  0.25 mg Nebulization BID   carvedilol  25 mg Oral BID   chlorhexidine  15 mL Mouth Rinse BID   Chlorhexidine Gluconate Cloth  6 each Topical Q0600   Chlorhexidine Gluconate Cloth  6 each Topical Q0600   cloNIDine  0.2 mg Oral Daily   epoetin (EPOGEN/PROCRIT) injection  10,000 Units Intravenous Q M,W,F-HD   feeding supplement (NEPRO CARB STEADY)  237 mL Oral TID BM   ferric citrate  420 mg Oral TID WC   gabapentin  400 mg Oral QHS   heparin  5,000 Units Subcutaneous Q8H   heparin sodium (porcine)       hydrALAZINE  50 mg Oral Q8H   insulin aspart  0-5 Units Subcutaneous QHS   insulin aspart  0-9 Units Subcutaneous TID WC   insulin detemir  8 Units Subcutaneous Daily   ipratropium-albuterol  3 mL Nebulization BID   isosorbide dinitrate  10 mg Oral TID   levETIRAcetam  500 mg Oral Daily   mouth rinse  15 mL Mouth Rinse q12n4p   multivitamin  1 tablet Oral QHS   sevelamer carbonate  1,600 mg Oral TID WC   PRN Medications: sodium chloride, sodium chloride, acetaminophen **OR** acetaminophen, alteplase, heparin, lidocaine (PF), lidocaine-prilocaine, pentafluoroprop-tetrafluoroeth Hemodynamic parameters:   Intake/Output: 08/02 0701 - 08/03 0700 In: 252.7 [IV Piggyback:252.7] Out: -   Ventilator  Settings:        LAB RESULTS:  Basic Metabolic Panel: Recent Labs  Lab 05/15/21 0649 05/18/21 0143 05/18/21 0547 05/19/21 0405 05/20/21 0437  NA 136 134*  --  137 130*  K 4.0 4.6  --  5.3* 5.6*  CL 95* 95*  --  97* 94*  CO2 28 27  --  26 25  GLUCOSE 204* 183*  --  200* 329*  BUN 40* 49*  --  60* 88*  CREATININE 5.29* 6.30* 6.48* 6.88* 7.81*  CALCIUM 8.8* 8.7*  --  8.5* 7.9*  PHOS  --   --   --  6.9*  --     Liver Function Tests: No results for input(s): AST, ALT, ALKPHOS, BILITOT, PROT, ALBUMIN in the last 168 hours. No results for input(s): LIPASE, AMYLASE in the last 168 hours. No results for input(s): AMMONIA in the last 168 hours. CBC: Recent Labs  Lab 05/18/21 0143 05/18/21 0547 05/19/21 0405 05/20/21 0437 05/20/21 1420  WBC 6.1 6.9 6.1 9.2  --   NEUTROABS 4.5  --  5.2 6.9  --  HGB 6.8* 7.8* 7.2* 5.8* 7.6*  HCT 22.8* 25.9* 23.5* 19.0* 23.3*  MCV 84.8 83.5 85.1 84.8  --   PLT 97* 123* 93* 122*  --     Cardiac Enzymes: No results for input(s): CKTOTAL, CKMB, CKMBINDEX, TROPONINI in the last 168 hours. BNP: Invalid input(s): POCBNP CBG: Recent Labs  Lab 05/19/21 1648 05/19/21 1922 05/19/21 1927 05/20/21 0740 05/20/21 1136  GLUCAP 309* 278* 257* 284* 229*        IMAGING RESULTS:  Imaging: CT Angio Chest Pulmonary Embolism (PE) W or WO Contrast  Result Date: 05/20/2021 CLINICAL DATA:  Shortness of breath.  PE suspected, high prob EXAM: CT ANGIOGRAPHY CHEST WITH CONTRAST TECHNIQUE: Multidetector CT imaging of the chest was performed using the standard protocol during bolus administration of intravenous contrast. Multiplanar CT image reconstructions and MIPs were obtained to evaluate the vascular anatomy. CONTRAST:  64m OMNIPAQUE IOHEXOL 350 MG/ML SOLN COMPARISON:  05/18/2021 FINDINGS: Cardiovascular: No filling defects in the pulmonary arteries to suggest pulmonary emboli. Extensive coronary artery calcifications and scattered aortic calcifications. No  aneurysm. Heart is mildly enlarged. Mediastinum/Nodes: Mildly prominent mediastinal lymph nodes, similar to prior study. Esophageal wall thickening again noted in the mid esophagus. Trachea and thyroid unremarkable. Lungs/Pleura: Small bilateral pleural effusions, similar prior study. Bilateral lower lobe airspace opacities, with some improvement in the left lower lobe since prior study. Pleural calcifications in the right lower hemithorax, stable. Upper Abdomen: Imaging into the upper abdomen demonstrates no acute findings. Musculoskeletal: Chest wall soft tissues are unremarkable. No acute bony abnormality. Review of the MIP images confirms the above findings. IMPRESSION: No evidence of pulmonary embolus. Cardiomegaly, coronary artery disease. Small bilateral effusions. Bilateral lower lobe airspace opacities with some improvement on the left since prior study. Findings could reflect edema or infection. Aortic Atherosclerosis (ICD10-I70.0). Electronically Signed   By: KRolm BaptiseM.D.   On: 05/20/2021 08:37   @PROBHOSP @ CT Angio Chest Pulmonary Embolism (PE) W or WO Contrast  Result Date: 05/20/2021 CLINICAL DATA:  Shortness of breath.  PE suspected, high prob EXAM: CT ANGIOGRAPHY CHEST WITH CONTRAST TECHNIQUE: Multidetector CT imaging of the chest was performed using the standard protocol during bolus administration of intravenous contrast. Multiplanar CT image reconstructions and MIPs were obtained to evaluate the vascular anatomy. CONTRAST:  71mOMNIPAQUE IOHEXOL 350 MG/ML SOLN COMPARISON:  05/18/2021 FINDINGS: Cardiovascular: No filling defects in the pulmonary arteries to suggest pulmonary emboli. Extensive coronary artery calcifications and scattered aortic calcifications. No aneurysm. Heart is mildly enlarged. Mediastinum/Nodes: Mildly prominent mediastinal lymph nodes, similar to prior study. Esophageal wall thickening again noted in the mid esophagus. Trachea and thyroid unremarkable. Lungs/Pleura:  Small bilateral pleural effusions, similar prior study. Bilateral lower lobe airspace opacities, with some improvement in the left lower lobe since prior study. Pleural calcifications in the right lower hemithorax, stable. Upper Abdomen: Imaging into the upper abdomen demonstrates no acute findings. Musculoskeletal: Chest wall soft tissues are unremarkable. No acute bony abnormality. Review of the MIP images confirms the above findings. IMPRESSION: No evidence of pulmonary embolus. Cardiomegaly, coronary artery disease. Small bilateral effusions. Bilateral lower lobe airspace opacities with some improvement on the left since prior study. Findings could reflect edema or infection. Aortic Atherosclerosis (ICD10-I70.0). Electronically Signed   By: KeRolm Baptise.D.   On: 05/20/2021 08:37         ASSESSMENT AND PLAN    -Multidisciplinary rounds held today  Acute on chronic Hypoxic Respiratory Failure - present on admission  -note patient is immunocompromised on  biologic injection with [name] for HS - COVID19 negative   - supplemental O2 during my evaluation Bipap - will perform infectious workup for pneumonia -Respiratory viral panel -serum fungitell -legionella ab -strep pneumoniae ur AG -Histoplasma Ur Ag -sputum resp cultures -AFB sputum expectorated specimen -sputum cytology  -reviewed pertinent imaging with patient today - ESR -on CAP regimen with rocephin /zithromax  Acute on chronic decompensated systolic CHF exacerbation with EF <35%  - agree with lasix 80 bid - appreciate renal and TRH team   -BNP>4500 -oxygen as needed -Lasix as tolerated -follow up cardiac enzymes as indicated ICU monitoring   Left pleural effusion  Monocyte and lymphycyte predominant transudate consistent with CHF/CKD related fluid -empirically treated for CAP with rocephin and zithromax  Renal Failure-ESRD -follow chem 7 -appreciate renal team -follow UO -continue condom Catheter-assess need  daily   ID -continue IV abx as prescibed -follow up cultures  GI/Nutrition GI PROPHYLAXIS as indicated DIET-->TF's as tolerated Constipation protocol as indicated  ENDO - ICU hypoglycemic\Hyperglycemia protocol -check FSBS per protocol   ELECTROLYTES -follow labs as needed -replace as needed -pharmacy consultation   DVT/GI PRX ordered -SCDs  TRANSFUSIONS AS NEEDED MONITOR FSBS ASSESS the need for LABS as needed   Critical care provider statement:    Critical care time (minutes):  33   Critical care time was exclusive of:  Separately billable procedures and treating other patients   Critical care was necessary to treat or prevent imminent or life-threatening deterioration of the following conditions:     Critical care was time spent personally by me on the following activities:  Development of treatment plan with patient or surrogate, discussions with consultants, evaluation of patient's response to treatment, examination of patient, obtaining history from patient or surrogate, ordering and performing treatments and interventions, ordering and review of laboratory studies and re-evaluation of patient's condition.  I assumed direction of critical care for this patient from another provider in my specialty: no    This document was prepared using Dragon voice recognition software and may include unintentional dictation errors.    Ottie Glazier, M.D.  Division of [place]

## 2021-05-21 DIAGNOSIS — J9601 Acute respiratory failure with hypoxia: Secondary | ICD-10-CM | POA: Diagnosis not present

## 2021-05-21 DIAGNOSIS — N186 End stage renal disease: Secondary | ICD-10-CM

## 2021-05-21 DIAGNOSIS — Z992 Dependence on renal dialysis: Secondary | ICD-10-CM

## 2021-05-21 DIAGNOSIS — I509 Heart failure, unspecified: Secondary | ICD-10-CM

## 2021-05-21 DIAGNOSIS — Z794 Long term (current) use of insulin: Secondary | ICD-10-CM

## 2021-05-21 DIAGNOSIS — E1129 Type 2 diabetes mellitus with other diabetic kidney complication: Secondary | ICD-10-CM

## 2021-05-21 DIAGNOSIS — D631 Anemia in chronic kidney disease: Secondary | ICD-10-CM

## 2021-05-21 DIAGNOSIS — D649 Anemia, unspecified: Secondary | ICD-10-CM

## 2021-05-21 DIAGNOSIS — Z89512 Acquired absence of left leg below knee: Secondary | ICD-10-CM

## 2021-05-21 DIAGNOSIS — Z7189 Other specified counseling: Secondary | ICD-10-CM | POA: Diagnosis not present

## 2021-05-21 DIAGNOSIS — J90 Pleural effusion, not elsewhere classified: Secondary | ICD-10-CM

## 2021-05-21 DIAGNOSIS — Z89511 Acquired absence of right leg below knee: Secondary | ICD-10-CM

## 2021-05-21 DIAGNOSIS — I10 Essential (primary) hypertension: Secondary | ICD-10-CM

## 2021-05-21 LAB — CBC WITH DIFFERENTIAL/PLATELET
Abs Immature Granulocytes: 0.56 10*3/uL — ABNORMAL HIGH (ref 0.00–0.07)
Basophils Absolute: 0 10*3/uL (ref 0.0–0.1)
Basophils Relative: 0 %
Eosinophils Absolute: 0 10*3/uL (ref 0.0–0.5)
Eosinophils Relative: 0 %
HCT: 28.1 % — ABNORMAL LOW (ref 39.0–52.0)
Hemoglobin: 8.8 g/dL — ABNORMAL LOW (ref 13.0–17.0)
Immature Granulocytes: 6 %
Lymphocytes Relative: 8 %
Lymphs Abs: 0.8 10*3/uL (ref 0.7–4.0)
MCH: 26.9 pg (ref 26.0–34.0)
MCHC: 31.3 g/dL (ref 30.0–36.0)
MCV: 85.9 fL (ref 80.0–100.0)
Monocytes Absolute: 0.7 10*3/uL (ref 0.1–1.0)
Monocytes Relative: 7 %
Neutro Abs: 7.8 10*3/uL — ABNORMAL HIGH (ref 1.7–7.7)
Neutrophils Relative %: 79 %
Platelets: 178 10*3/uL (ref 150–400)
RBC: 3.27 MIL/uL — ABNORMAL LOW (ref 4.22–5.81)
RDW: 19.1 % — ABNORMAL HIGH (ref 11.5–15.5)
Smear Review: NORMAL
WBC: 9.9 10*3/uL (ref 4.0–10.5)
nRBC: 0 % (ref 0.0–0.2)

## 2021-05-21 LAB — BASIC METABOLIC PANEL
Anion gap: 11 (ref 5–15)
BUN: 44 mg/dL — ABNORMAL HIGH (ref 6–20)
CO2: 30 mmol/L (ref 22–32)
Calcium: 8.2 mg/dL — ABNORMAL LOW (ref 8.9–10.3)
Chloride: 96 mmol/L — ABNORMAL LOW (ref 98–111)
Creatinine, Ser: 4.76 mg/dL — ABNORMAL HIGH (ref 0.61–1.24)
GFR, Estimated: 13 mL/min — ABNORMAL LOW (ref >60)
Glucose, Bld: 190 mg/dL — ABNORMAL HIGH (ref 70–99)
Potassium: 4.1 mmol/L (ref 3.5–5.1)
Sodium: 137 mmol/L (ref 135–145)

## 2021-05-21 LAB — BODY FLUID CULTURE W GRAM STAIN: Culture: NO GROWTH

## 2021-05-21 LAB — GLUCOSE, CAPILLARY
Glucose-Capillary: 191 mg/dL — ABNORMAL HIGH (ref 70–99)
Glucose-Capillary: 153 mg/dL — ABNORMAL HIGH (ref 70–99)
Glucose-Capillary: 150 mg/dL — ABNORMAL HIGH (ref 70–99)
Glucose-Capillary: 125 mg/dL — ABNORMAL HIGH (ref 70–99)

## 2021-05-21 MED ORDER — ORAL CARE MOUTH RINSE
15.0000 mL | Freq: Two times a day (BID) | OROMUCOSAL | Status: DC
  Administered 2021-05-21 – 2021-05-26 (×10): 15 mL via OROMUCOSAL

## 2021-05-21 NOTE — NC FL2 (Signed)
[place] LEVEL OF CARE SCREENING TOOL     IDENTIFICATION  Patient Name: [name] Birthdate: 12/02/1960 Sex: male Admission Date (Current Location): 05/18/2021  [facility] and [place] Number:  Engineering geologist and Address:  [facility], [address] [place], [place] [address]      Provider Number: [id number]  Attending Physician Name and Address:  Max Sane, MD  Relative Name and Phone Number:  Rose, Hippler (Spouse)   [phone]    Current Level of Care: Hospital Recommended Level of Care: [place] Prior Approval Number:    Date Approved/Denied:   PASRR Number: [id number]  Discharge Plan: SNF    Current Diagnoses: Patient Active Problem List   Diagnosis Date Noted   Acute decompensated heart failure ([place]) 05/19/2021   Acute respiratory failure with hypoxia ([place]) 05/18/2021   Protein-calorie malnutrition, severe 05/14/2021   Acute pulmonary edema ([facility])    Shortness of breath    Fluid overload 05/12/2021   Cellulitis and abscess of buttock    History of GI bleed 02/06/2021   Long term current use of immunosuppressive drug 02/06/2021   Disorder of skin due to Crohn's disease ([place]) 13/24/4010   Complication of vascular access for dialysis 01/22/2021   Polyp of transverse colon    Acute metabolic encephalopathy 27/25/3664   Acute on chronic anemia 40/34/7425   Chronic systolic CHF (congestive heart failure) ([place]) 11/18/2020   Cerebrovascular disease 09/12/2020   Transient alteration of awareness 09/12/2020   Hyperkalemia 09/12/2020   Mild cognitive impairment 09/30/2019   Peripheral vascular disease ([place]) 06/28/2018   Sepsis ([place]) 01/12/2018   Pressure injury of skin 12/04/2017   S/P bilateral BKA (below knee amputation) ([place]) 10/20/2017   Atherosclerosis of native arteries of extremity with rest pain ([place]) 08/05/2017   History of CVA (cerebrovascular accident) 04/15/2017   Seizure disorder  ([place]) 04/15/2017   End stage renal failure on dialysis ([place]) 04/12/2016   Aphthae 02/20/2016   Hidradenitis suppurativa 02/20/2016   Leg pain 02/20/2016   Neuropathy 02/20/2016   Narrowing of intervertebral disc space 08/29/2015   Vascular disorder of lower extremity 08/29/2015   Failure of erection 08/29/2015   Hypercholesteremia 08/29/2015   Hypertension 08/29/2015   Anemia due to chronic kidney disease 05/26/2015   Venous insufficiency of leg 09/04/2014   History of deep vein thrombosis (DVT) of lower extremity 08/16/2014   Prostatic intraepithelial neoplasia 11/02/2013   Elevated prostate specific antigen (PSA) 09/11/2013   Benign prostatic hyperplasia with urinary obstruction 08/13/2013   Spermatocele 08/13/2013   Avitaminosis D 01/25/2013   Type 2 diabetes mellitus with kidney complication, with long-term current use of insulin ([place]) 03/28/2012   Crohn disease ([place]) 08/03/2011    Orientation RESPIRATION BLADDER Height & Weight     Self, Time, Situation, Place  Normal External catheter Weight: 60.7 kg Height:  5' 11"  (180.3 cm)  BEHAVIORAL SYMPTOMS/MOOD NEUROLOGICAL BOWEL NUTRITION STATUS      Continent Diet  AMBULATORY STATUS COMMUNICATION OF NEEDS Skin   Extensive Assist Verbally Other (Comment)                       Personal Care Assistance Level of Assistance  Bathing, Feeding, Dressing Bathing Assistance: Limited assistance Feeding assistance: Independent Dressing Assistance: Limited assistance     Functional Limitations Info  Sight, Hearing, Speech Sight Info: Adequate Hearing Info: Adequate Speech Info: Adequate    SPECIAL CARE FACTORS FREQUENCY  PT (By licensed PT), OT (By  licensed OT)     PT Frequency: 5x week OT Frequency: 5x week            Contractures Contractures Info: Not present    Additional Factors Info  Code Status, Allergies Code Status Info: Full Allergies Info: Vancomycin, Methotrexate, Tap, Cefepime            Current Medications (05/21/2021):  This is the current hospital active medication list Current Facility-Administered Medications  Medication Dose Route Frequency Provider Last Rate Last Admin   0.9 %  sodium chloride infusion  100 mL Intravenous PRN Breeze, Shantelle, NP       0.9 %  sodium chloride infusion  100 mL Intravenous PRN Colon Flattery, NP       acetaminophen (TYLENOL) tablet 650 mg  650 mg Oral Q6H PRN Rise Patience, MD   650 mg at 05/20/21 6812   Or   acetaminophen (TYLENOL) suppository 650 mg  650 mg Rectal Q6H PRN Rise Patience, MD       alteplase (CATHFLO ACTIVASE) injection 2 mg  2 mg Intracatheter Once PRN Colon Flattery, NP       arformoterol (BROVANA) nebulizer solution 15 mcg  15 mcg Nebulization BID Ralene Muskrat B, MD   15 mcg at 05/21/21 0730   ascorbic acid (VITAMIN C) tablet 500 mg  500 mg Oral BID Ralene Muskrat B, MD   500 mg at 05/21/21 0846   aspirin chewable tablet 81 mg  81 mg Oral Daily Rise Patience, MD   81 mg at 05/21/21 0844   atorvastatin (LIPITOR) tablet 80 mg  80 mg Oral Daily Rise Patience, MD   80 mg at 05/21/21 0843   azithromycin (ZITHROMAX) tablet 500 mg  500 mg Oral Daily Benita Gutter, RPH   500 mg at 05/21/21 0847   budesonide (PULMICORT) nebulizer solution 0.25 mg  0.25 mg Nebulization BID Priscella Mann, Sudheer B, MD   0.25 mg at 05/21/21 0730   carvedilol (COREG) tablet 25 mg  25 mg Oral BID Rise Patience, MD   25 mg at 05/21/21 0843   cefTRIAXone (ROCEPHIN) 1 g in sodium chloride 0.9 % 100 mL IVPB  1 g Intravenous Q24H Ralene Muskrat B, MD   Stopped at 05/21/21 7517   chlorhexidine (PERIDEX) 0.12 % solution 15 mL  15 mL Mouth Rinse BID Ottie Glazier, MD   15 mL at 05/21/21 0843   Chlorhexidine Gluconate Cloth 2 % PADS 6 each  6 each Topical Q0600 Colon Flattery, NP   6 each at 05/18/21 1522   Chlorhexidine Gluconate Cloth 2 % PADS 6 each  6 each Topical Q0600 Ottie Glazier, MD   6 each at  05/21/21 0600   epoetin alfa (EPOGEN) injection 10,000 Units  10,000 Units Intravenous Q M,W,F-HD Kolluru, Sarath, MD   10,000 Units at 05/20/21 1328   feeding supplement (NEPRO CARB STEADY) liquid 237 mL  237 mL Oral TID BM Sreenath, Sudheer B, MD   237 mL at 05/21/21 0848   ferric citrate (AURYXIA) tablet 420 mg  420 mg Oral TID WC Kolluru, Sarath, MD   420 mg at 05/21/21 0847   gabapentin (NEURONTIN) capsule 400 mg  400 mg Oral QHS Rise Patience, MD   400 mg at 05/20/21 2015   heparin injection 1,000 Units  1,000 Units Dialysis PRN Colon Flattery, NP       heparin injection 5,000 Units  5,000 Units Subcutaneous Q8H Rise Patience, MD   5,000 Units  at 05/21/21 0636   hydrALAZINE (APRESOLINE) tablet 50 mg  50 mg Oral Q8H Minna Merritts, MD   50 mg at 05/21/21 0636   insulin aspart (novoLOG) injection 0-5 Units  0-5 Units Subcutaneous QHS Manuella Ghazi, Vipul, MD       insulin aspart (novoLOG) injection 0-9 Units  0-9 Units Subcutaneous TID WC Max Sane, MD   2 Units at 05/21/21 0843   insulin detemir (LEVEMIR) injection 8 Units  8 Units Subcutaneous Daily Max Sane, MD   8 Units at 05/21/21 0847   ipratropium-albuterol (DUONEB) 0.5-2.5 (3) MG/3ML nebulizer solution 3 mL  3 mL Nebulization BID Ottie Glazier, MD   3 mL at 05/21/21 0730   isosorbide dinitrate (ISORDIL) tablet 10 mg  10 mg Oral TID Kate Sable, MD   10 mg at 05/21/21 0847   levETIRAcetam (KEPPRA) tablet 500 mg  500 mg Oral Daily Rise Patience, MD   500 mg at 05/20/21 2016   lidocaine (PF) (XYLOCAINE) 1 % injection 5 mL  5 mL Intradermal PRN Colon Flattery, NP       lidocaine-prilocaine (EMLA) cream 1 application  1 application Topical PRN Colon Flattery, NP       MEDLINE mouth rinse  15 mL Mouth Rinse q12n4p Ottie Glazier, MD   15 mL at 05/20/21 1601   multivitamin (RENA-VIT) tablet 1 tablet  1 tablet Oral QHS Rise Patience, MD   1 tablet at 05/20/21 2015   pentafluoroprop-tetrafluoroeth  (GEBAUERS) aerosol 1 application  1 application Topical PRN Colon Flattery, NP       sevelamer carbonate (RENVELA) tablet 1,600 mg  1,600 mg Oral TID WC Kolluru, Sarath, MD   1,600 mg at 05/21/21 9379     Discharge Medications: Please see discharge summary for a list of discharge medications.  Relevant Imaging Results:  Relevant Lab Results:   Additional Information ss# [id number]  Kerin Salen, RN

## 2021-05-21 NOTE — Progress Notes (Signed)
PROGRESS NOTE    [name]  VAN:[id number] DOB: 21-Aug-1961 DOA: 05/18/2021 PCP: [name], MD    Brief Narrative:   60 year old male history of ESRD on hemodialysis MWF, hypertension, seizure disorder, anemia, systolic chronic heart failure, hidradenitis on [name], bilateral BKA who presents to the ED for evaluation of worsening shortness of breath.  Patient was recently admitted and discharged on 7/29 for similar complaints.  Now presents with persistent fluid overload, possible pneumonia associated with parapneumonic effusion.  Patient is chronically ill and majority of history is gained by speaking with patient's wife.  Patient's wife states that shortness of breath progressively worsened after discharge.  Patient has no associated chest pain, productive cough, fever, chills.  Initially required 10 L oxygen to maintain saturation greater than 90.  Hemoglobin also dropped to 6.8 from baseline approximate.ly 8.  Started on antibiotics for pneumonia, thoracentesis ordered, nephrology consulted.  8/1: Received page from ICU charge RN.  Patient had rapid response called while in hemodialysis.  I went and evaluated the patient at bedside.  Found him to be on a nonrebreather with saturations in the low to mid 80s.  Work of breathing not especially increased.  Lung sounds very coarse and rhonchorous.  Unable to wean off nonrebreather.  Of note patient did get a thoracentesis with 1 L fluid removed today.  No fluid removal was attempted with hemodialysis.  Patient only completed about 30 minutes of his dialysis session.   Will be transferred to stepdown unit for closer monitoring.  BiPAP initiated.  COPD treatment started.  Case discussed at length with ICU attending.  Consultation greatly appreciated  8/2: Respiratory status much improved.  Patient weaned off BiPAP.  Saturating well on 2 L nasal cannula.  Stable for transfer to medical floor.  Cardiology consult requested and appreciated. 8/3:  Was not looking too good and family was debating for comfort care.  Hemoglobin dropped to 5.8 requiring 1 unit of packed RBC transfusion 8/4 patient much more alert, hemoglobin stabilized and eating.  Family not interested in hospice anymore and wants to continue dialysis   Assessment & Plan:   Principal Problem:   Acute respiratory failure with hypoxia (HCC) Active Problems:   Hypertension   Anemia due to chronic kidney disease   Crohn disease (HCC)   Hidradenitis suppurativa   Type 2 diabetes mellitus with kidney complication, with long-term current use of insulin (HCC)   End stage renal failure on dialysis ([place] [facility])   History of CVA (cerebrovascular accident)   Seizure disorder ([name])   S/P bilateral BKA (below knee amputation) ([name])   Acute on chronic anemia   Chronic systolic CHF (congestive heart failure) ([name])   Long term current use of immunosuppressive drug   Acute decompensated heart failure (HCC)  Acute respiratory failure with hypoxia RRT called during dialysis on 8/1 for worsening hypoxia requiring transfer to stepdown for BiPAP,  S/p thoracentesis on 8/1 with removal of 1 L of fluid Weaned off BiPAP as of 8/2, saturating well on 2 L CT chest on 8/3 negative for PE  Acute on chronic anemia of chronic kidney disease Baseline hemoglobin in the eights Presented with hemoglobin 6.8-> 5.8-> 8.8 Status post 1 unit PRBC on 8/3 with dialysis  Community-acquired pneumonia On CAP coverage, azithromycin and Rocephin Patient is immunocompromised on [name] Extensive infectious work-up in progress Dr. Lanney Brady is following  Acute on chronic decompensated systolic and diastolic congestive heart failure Echo showing EF of 30 to 35%.  Ischemic work-up not  planned by cardiology due to severe anemia.  Medical management recommended.  Holding clonidine -Continue Coreg, hydralazine, isosorbide for now  Essential hypertension -Had episode of hypotension this morning.  Holding  clonidine now -Continue Coreg, hydralazine, isosorbide  End-stage renal disease on hemodialysis HD per nephro.  Patient wants to continue  Hyperlipidemia Continue statin  Type 2 diabetes mellitus Continue sliding scale.  Continue insulin Levemir 8 units subcu daily  Seizure disorder Continue Keppra  History of hidradenitis  [name] on hold  Peripheral arterial disease status post bilateral BKA No acute issues  Severe Protein-calorie malnutrition As evidenced by muscle wasting Body mass index is 18.66 kg/m.   Goals of care Overall poor prognosis.  Patient and family wants to continue dialysis per their discussion with palliative care today and hoping for rehab placement.  PT and OT does recommend SNF.  TOC aware and working on placement    DVT prophylaxis: SQ heparin Code Status: Full code Family Communication: Son updated at bedside on 8/4 Disposition Plan: Status is: Inpatient  Remains inpatient appropriate because:Inpatient level of care appropriate due to severity of illness  Dispo: The patient is from: Home              Anticipated d/c is to: SNF               Patient currently is not medically stable to d/c.   Difficult to place patient No  Acute hypoxic respiratory failure, multifactorial secondary to ESRD, decompensated heart failure, community-acquired pneumonia, bilateral pleural effusions.  Disposition plan unclear at this time.     Level of care: Progressive Cardiac  Consultants:  Pulmonary Cardiology Nephrology  Procedures:  Thoracentesis 8/1  Antimicrobials:  Ceftriaxone Azithromycin   Subjective: Was eating his lunch when I saw him.  Son at bedside.  Patient is feeling much better and wants to continue his dialysis  Objective: Vitals:   05/21/21 1237 05/21/21 1300 05/21/21 1400 05/21/21 1500  BP: 111/62 (!) 141/69 (!) 142/69 (!) 144/74  Pulse:  71 73 73  Resp:      Temp:      TempSrc:      SpO2:  98% 100% 97%  Weight:       Height:        Intake/Output Summary (Last 24 hours) at 05/21/2021 1701 Last data filed at 05/21/2021 1200 Gross per 24 hour  Intake 574 ml  Output 75 ml  Net 499 ml   Filed Weights   05/19/21 0414 05/20/21 0500 05/21/21 0500  Weight: 56.4 kg 59 kg 60.7 kg    Examination:  General exam: No acute distress.  Appears frail and chronically ill Respiratory system: Bilateral coarse crackles.  Normal work of breathing.  2 L Cardiovascular system: S1-S2, regular rate and rhythm, 2/6 systolic murmur  gastrointestinal system: Thin, nontender, nondistended, normal bowel sounds Central nervous system: Alert and oriented.  No focal deficits Extremities: Bilateral BKA Skin: Scattered excoriations.  No ulcers or induration Psychiatry: Judgement and insight appear normal. Mood & affect appropriate.     Data Reviewed: I have personally reviewed following labs and imaging studies  CBC: Recent Labs  Lab 05/18/21 0143 05/18/21 0547 05/19/21 0405 05/20/21 0437 05/20/21 1420 05/21/21 0522  WBC 6.1 6.9 6.1 9.2  --  9.9  NEUTROABS 4.5  --  5.2 6.9  --  7.8*  HGB 6.8* 7.8* 7.2* 5.8* 7.6* 8.8*  HCT 22.8* 25.9* 23.5* 19.0* 23.3* 28.1*  MCV 84.8 83.5 85.1 84.8  --  85.9  PLT 97* 123* 93* 122*  --  161   Basic Metabolic Panel: Recent Labs  Lab 05/15/21 0649 05/18/21 0143 05/18/21 0547 05/19/21 0405 05/20/21 0437 05/21/21 0522  NA 136 134*  --  137 130* 137  K 4.0 4.6  --  5.3* 5.6* 4.1  CL 95* 95*  --  97* 94* 96*  CO2 28 27  --  26 25 30   GLUCOSE 204* 183*  --  200* 329* 190*  BUN 40* 49*  --  60* 88* 44*  CREATININE 5.29* 6.30* 6.48* 6.88* 7.81* 4.76*  CALCIUM 8.8* 8.7*  --  8.5* 7.9* 8.2*  PHOS  --   --   --  6.9*  --   --    GFR: Estimated Creatinine Clearance: 14.5 mL/min (A) (by C-G formula based on SCr of 4.76 mg/dL (H)). Liver Function Tests: No results for input(s): AST, ALT, ALKPHOS, BILITOT, PROT, ALBUMIN in the last 168 hours. No results for input(s): LIPASE, AMYLASE  in the last 168 hours. No results for input(s): AMMONIA in the last 168 hours. Coagulation Profile: No results for input(s): INR, PROTIME in the last 168 hours. Cardiac Enzymes: No results for input(s): CKTOTAL, CKMB, CKMBINDEX, TROPONINI in the last 168 hours. BNP (last 3 results) No results for input(s): PROBNP in the last 8760 hours. HbA1C: No results for input(s): HGBA1C in the last 72 hours.  CBG: Recent Labs  Lab 05/20/21 1557 05/20/21 1956 05/21/21 0739 05/21/21 1127 05/21/21 1635  GLUCAP 110* 150* 191* 153* 150*   Lipid Profile: No results for input(s): CHOL, HDL, LDLCALC, TRIG, CHOLHDL, LDLDIRECT in the last 72 hours. Thyroid Function Tests: No results for input(s): TSH, T4TOTAL, FREET4, T3FREE, THYROIDAB in the last 72 hours. Anemia Panel: No results for input(s): VITAMINB12, FOLATE, FERRITIN, TIBC, IRON, RETICCTPCT in the last 72 hours. Sepsis Labs: Recent Labs  Lab 05/18/21 0143  PROCALCITON 1.09    Recent Results (from the past 240 hour(s))  Resp Panel by RT-PCR (Flu A&B, Covid) Nasopharyngeal Swab     Status: None   Collection Time: 05/12/21  9:03 PM   Specimen: Nasopharyngeal Swab; Nasopharyngeal(NP) swabs in vial transport medium  Result Value Ref Range Status   SARS Coronavirus 2 by RT PCR NEGATIVE NEGATIVE Final    Comment: (NOTE) SARS-CoV-2 target nucleic acids are NOT DETECTED.  The SARS-CoV-2 RNA is generally detectable in upper respiratory specimens during the acute phase of infection. The lowest concentration of SARS-CoV-2 viral copies this assay can detect is 138 copies/mL. A negative result does not preclude SARS-Cov-2 infection and should not be used as the sole basis for treatment or other patient management decisions. A negative result may occur with  improper specimen collection/handling, submission of specimen other than nasopharyngeal swab, presence of viral mutation(s) within the areas targeted by this assay, and inadequate number of  viral copies(<138 copies/mL). A negative result must be combined with clinical observations, patient history, and epidemiological information. The expected result is Negative.  Fact Sheet for Patients:  EntrepreneurPulse.com.au  Fact Sheet for Healthcare Providers:  IncredibleEmployment.be  This test is no t yet approved or cleared by the [place] FDA and  has been authorized for detection and/or diagnosis of SARS-CoV-2 by FDA under an Emergency Use Authorization (EUA). This EUA will remain  in effect (meaning this test can be used) for the duration of the COVID-19 declaration under Section 564(b)(1) of the Act, 21 U.S.C.section 360bbb-3(b)(1), unless the authorization is terminated  or revoked sooner.  Influenza A by PCR NEGATIVE NEGATIVE Final   Influenza B by PCR NEGATIVE NEGATIVE Final    Comment: (NOTE) The Xpert Xpress SARS-CoV-2/FLU/RSV plus assay is intended as an aid in the diagnosis of influenza from Nasopharyngeal swab specimens and should not be used as a sole basis for treatment. Nasal washings and aspirates are unacceptable for Xpert Xpress SARS-CoV-2/FLU/RSV testing.  Fact Sheet for Patients: EntrepreneurPulse.com.au  Fact Sheet for Healthcare Providers: IncredibleEmployment.be  This test is not yet approved or cleared by the [place] FDA and has been authorized for detection and/or diagnosis of SARS-CoV-2 by FDA under an Emergency Use Authorization (EUA). This EUA will remain in effect (meaning this test can be used) for the duration of the COVID-19 declaration under Section 564(b)(1) of the Act, 21 U.S.C. section 360bbb-3(b)(1), unless the authorization is terminated or revoked.  Performed at [facility], [address] [place], [place] [address]   MRSA Next Gen by PCR, Nasal     Status: None   Collection Time: 05/12/21 11:06 PM   Specimen: Nasal Mucosa;  Nasal Swab  Result Value Ref Range Status   MRSA by PCR Next Gen NOT DETECTED NOT DETECTED Final    Comment: (NOTE) The GeneXpert MRSA Assay (FDA approved for NASAL specimens only), is one component of a comprehensive MRSA colonization surveillance program. It is not intended to diagnose MRSA infection nor to guide or monitor treatment for MRSA infections. Test performance is not FDA approved in patients less than 6 years old. Performed at [facility], [address] [place], [place] [address]   Culture, blood (routine x 2) Call MD if unable to obtain prior to antibiotics being given     Status: None   Collection Time: 05/12/21 11:54 PM   Specimen: BLOOD  Result Value Ref Range Status   Specimen Description BLOOD RIGHT FOREARM  Final   Special Requests   Final    BOTTLES DRAWN AEROBIC ONLY Blood Culture results may not be optimal due to an inadequate volume of blood received in culture bottles   Culture   Final    NO GROWTH 5 DAYS Performed at [facility], [address] [place], [place] [address]    Report Status 05/18/2021 FINAL  Final  Resp Panel by RT-PCR (Flu A&B, Covid) Nasopharyngeal Swab     Status: None   Collection Time: 05/18/21  1:43 AM   Specimen: Nasopharyngeal Swab; Nasopharyngeal(NP) swabs in vial transport medium  Result Value Ref Range Status   SARS Coronavirus 2 by RT PCR NEGATIVE NEGATIVE Final    Comment: (NOTE) SARS-CoV-2 target nucleic acids are NOT DETECTED.  The SARS-CoV-2 RNA is generally detectable in upper respiratory specimens during the acute phase of infection. The lowest concentration of SARS-CoV-2 viral copies this assay can detect is 138 copies/mL. A negative result does not preclude SARS-Cov-2 infection and should not be used as the sole basis for treatment or other patient management decisions. A negative result may occur with  improper specimen collection/handling, submission of specimen other than nasopharyngeal  swab, presence of viral mutation(s) within the areas targeted by this assay, and inadequate number of viral copies(<138 copies/mL). A negative result must be combined with clinical observations, patient history, and epidemiological information. The expected result is Negative.  Fact Sheet for Patients:  EntrepreneurPulse.com.au  Fact Sheet for Healthcare Providers:  IncredibleEmployment.be  This test is no t yet approved or cleared by the [place] FDA and  has been authorized for detection and/or diagnosis  of SARS-CoV-2 by FDA under an Emergency Use Authorization (EUA). This EUA will remain  in effect (meaning this test can be used) for the duration of the COVID-19 declaration under Section 564(b)(1) of the Act, 21 U.S.C.section 360bbb-3(b)(1), unless the authorization is terminated  or revoked sooner.       Influenza A by PCR NEGATIVE NEGATIVE Final   Influenza B by PCR NEGATIVE NEGATIVE Final    Comment: (NOTE) The Xpert Xpress SARS-CoV-2/FLU/RSV plus assay is intended as an aid in the diagnosis of influenza from Nasopharyngeal swab specimens and should not be used as a sole basis for treatment. Nasal washings and aspirates are unacceptable for Xpert Xpress SARS-CoV-2/FLU/RSV testing.  Fact Sheet for Patients: EntrepreneurPulse.com.au  Fact Sheet for Healthcare Providers: IncredibleEmployment.be  This test is not yet approved or cleared by the [place] FDA and has been authorized for detection and/or diagnosis of SARS-CoV-2 by FDA under an Emergency Use Authorization (EUA). This EUA will remain in effect (meaning this test can be used) for the duration of the COVID-19 declaration under Section 564(b)(1) of the Act, 21 U.S.C. section 360bbb-3(b)(1), unless the authorization is terminated or revoked.  Performed at [facility], [address] [place], [place] [address]   Blood  culture (routine x 2)     Status: None (Preliminary result)   Collection Time: 05/18/21  1:44 AM   Specimen: BLOOD  Result Value Ref Range Status   Specimen Description BLOOD RIGHT ANTECUBITAL  Final   Special Requests   Final    BOTTLES DRAWN AEROBIC AND ANAEROBIC Blood Culture adequate volume   Culture   Final    NO GROWTH 3 DAYS Performed at [facility], [address] [place], [place] [address]    Report Status PENDING  Incomplete  Blood culture (routine x 2)     Status: None (Preliminary result)   Collection Time: 05/18/21  1:44 AM   Specimen: BLOOD  Result Value Ref Range Status   Specimen Description BLOOD BLOOD RIGHT FOREARM  Final   Special Requests   Final    BOTTLES DRAWN AEROBIC AND ANAEROBIC Blood Culture adequate volume   Culture   Final    NO GROWTH 3 DAYS Performed at [facility], [address] [place], [place] [address]    Report Status PENDING  Incomplete  Acid Fast Smear (AFB)     Status: None   Collection Time: 05/18/21 11:25 AM   Specimen: PATH Cytology Pleural fluid  Result Value Ref Range Status   AFB Specimen Processing Concentration  Final   Acid Fast Smear Negative  Final    Comment: (NOTE) Performed At: [facility] [place], [place] [id number] Rush Farmer MD [id number]    Source (AFB) PLEURAL  Final    Comment: Performed at [facility], [address] [place], [place] [address]  Body fluid culture w Gram Stain     Status: None   Collection Time: 05/18/21 11:25 AM   Specimen: PATH Cytology Pleural fluid  Result Value Ref Range Status   Specimen Description   Final    PLEURAL Performed at [facility], [address] [place], [place] [address]    Special Requests   Final    NONE Performed at [facility], [address] [place], [place] [address]    Gram Stain   Final    RARE WBC PRESENT, PREDOMINANTLY MONONUCLEAR NO ORGANISMS SEEN    Culture   Final  NO GROWTH 3 DAYS Performed at [facility], [place] [address] [place], [place] [address]    Report Status 05/21/2021 FINAL  Final  Respiratory (~20 pathogens) panel by PCR     Status: None   Collection Time: 05/20/21  7:41 AM   Specimen: Nasopharyngeal Swab; Respiratory  Result Value Ref Range Status   Adenovirus NOT DETECTED NOT DETECTED Final   Coronavirus 229E NOT DETECTED NOT DETECTED Final    Comment: (NOTE) The Coronavirus on the Respiratory Panel, DOES NOT test for the novel  Coronavirus (2019 nCoV)    Coronavirus HKU1 NOT DETECTED NOT DETECTED Final   Coronavirus NL63 NOT DETECTED NOT DETECTED Final   Coronavirus OC43 NOT DETECTED NOT DETECTED Final   Metapneumovirus NOT DETECTED NOT DETECTED Final   Rhinovirus / Enterovirus NOT DETECTED NOT DETECTED Final   Influenza A NOT DETECTED NOT DETECTED Final   Influenza B NOT DETECTED NOT DETECTED Final   Parainfluenza Virus 1 NOT DETECTED NOT DETECTED Final   Parainfluenza Virus 2 NOT DETECTED NOT DETECTED Final   Parainfluenza Virus 3 NOT DETECTED NOT DETECTED Final   Parainfluenza Virus 4 NOT DETECTED NOT DETECTED Final   Respiratory Syncytial Virus NOT DETECTED NOT DETECTED Final   Bordetella pertussis NOT DETECTED NOT DETECTED Final   Bordetella Parapertussis NOT DETECTED NOT DETECTED Final   Chlamydophila pneumoniae NOT DETECTED NOT DETECTED Final   Mycoplasma pneumoniae NOT DETECTED NOT DETECTED Final    Comment: Performed at [facility], [place], [place], [place] [address]         Radiology Studies: CT Angio Chest Pulmonary Embolism (PE) W or WO Contrast  Result Date: 05/20/2021 CLINICAL DATA:  Shortness of breath.  PE suspected, high prob EXAM: CT ANGIOGRAPHY CHEST WITH CONTRAST TECHNIQUE: Multidetector CT imaging of the chest was performed using the standard protocol during bolus administration of intravenous contrast. Multiplanar CT image reconstructions and MIPs were obtained to evaluate the  vascular anatomy. CONTRAST:  25m OMNIPAQUE IOHEXOL 350 MG/ML SOLN COMPARISON:  05/18/2021 FINDINGS: Cardiovascular: No filling defects in the pulmonary arteries to suggest pulmonary emboli. Extensive coronary artery calcifications and scattered aortic calcifications. No aneurysm. Heart is mildly enlarged. Mediastinum/Nodes: Mildly prominent mediastinal lymph nodes, similar to prior study. Esophageal wall thickening again noted in the mid esophagus. Trachea and thyroid unremarkable. Lungs/Pleura: Small bilateral pleural effusions, similar prior study. Bilateral lower lobe airspace opacities, with some improvement in the left lower lobe since prior study. Pleural calcifications in the right lower hemithorax, stable. Upper Abdomen: Imaging into the upper abdomen demonstrates no acute findings. Musculoskeletal: Chest wall soft tissues are unremarkable. No acute bony abnormality. Review of the MIP images confirms the above findings. IMPRESSION: No evidence of pulmonary embolus. Cardiomegaly, coronary artery disease. Small bilateral effusions. Bilateral lower lobe airspace opacities with some improvement on the left since prior study. Findings could reflect edema or infection. Aortic Atherosclerosis (ICD10-I70.0). Electronically Signed   By: KRolm BaptiseM.D.   On: 05/20/2021 08:37        Scheduled Meds:  arformoterol  15 mcg Nebulization BID   vitamin C  500 mg Oral BID   aspirin  81 mg Oral Daily   atorvastatin  80 mg Oral Daily   azithromycin  500 mg Oral Daily   budesonide (PULMICORT) nebulizer solution  0.25 mg Nebulization BID   carvedilol  25 mg Oral BID   Chlorhexidine Gluconate Cloth  6 each Topical Q0600   Chlorhexidine Gluconate Cloth  6 each Topical Q0600  epoetin (EPOGEN/PROCRIT) injection  10,000 Units Intravenous Q M,W,F-HD   feeding supplement (NEPRO CARB STEADY)  237 mL Oral TID BM   ferric citrate  420 mg Oral TID WC   gabapentin  400 mg Oral QHS   heparin  5,000 Units Subcutaneous  Q8H   hydrALAZINE  50 mg Oral Q8H   insulin aspart  0-5 Units Subcutaneous QHS   insulin aspart  0-9 Units Subcutaneous TID WC   insulin detemir  8 Units Subcutaneous Daily   ipratropium-albuterol  3 mL Nebulization BID   isosorbide dinitrate  10 mg Oral TID   levETIRAcetam  500 mg Oral Daily   mouth rinse  15 mL Mouth Rinse q12n4p   multivitamin  1 tablet Oral QHS   sevelamer carbonate  1,600 mg Oral TID WC   Continuous Infusions:  sodium chloride     sodium chloride     cefTRIAXone (ROCEPHIN)  IV Stopped (05/21/21 0937)     LOS: 2 days    Time spent: 35 minutes    Max Sane, MD [facility] Pager [phone]  If 7PM-7AM, please contact night-coverage 05/21/2021, 5:01 PM

## 2021-05-21 NOTE — Progress Notes (Signed)
Progress Note  Patient Name: [name] Date of Encounter: 05/21/2021  Primary Cardiologist: New to [facility] - consult by End  Subjective   He received PRBC transfusion yesterday and 1 this feels improvement in his fatigue with hemoglobin trend of 7.6-8.8.  Following administration of morning medications including, carvedilol, clonidine, hydralazine, and isosorbide his BP is soft in the low 335K to 56Y systolic.  No chest pain or dyspnea.  Inpatient Medications    Scheduled Meds:  arformoterol  15 mcg Nebulization BID   vitamin C  500 mg Oral BID   aspirin  81 mg Oral Daily   atorvastatin  80 mg Oral Daily   azithromycin  500 mg Oral Daily   budesonide (PULMICORT) nebulizer solution  0.25 mg Nebulization BID   carvedilol  25 mg Oral BID   chlorhexidine  15 mL Mouth Rinse BID   Chlorhexidine Gluconate Cloth  6 each Topical Q0600   Chlorhexidine Gluconate Cloth  6 each Topical Q0600   epoetin (EPOGEN/PROCRIT) injection  10,000 Units Intravenous Q M,W,F-HD   feeding supplement (NEPRO CARB STEADY)  237 mL Oral TID BM   ferric citrate  420 mg Oral TID WC   gabapentin  400 mg Oral QHS   heparin  5,000 Units Subcutaneous Q8H   hydrALAZINE  50 mg Oral Q8H   insulin aspart  0-5 Units Subcutaneous QHS   insulin aspart  0-9 Units Subcutaneous TID WC   insulin detemir  8 Units Subcutaneous Daily   ipratropium-albuterol  3 mL Nebulization BID   isosorbide dinitrate  10 mg Oral TID   levETIRAcetam  500 mg Oral Daily   mouth rinse  15 mL Mouth Rinse q12n4p   multivitamin  1 tablet Oral QHS   sevelamer carbonate  1,600 mg Oral TID WC   Continuous Infusions:  sodium chloride     sodium chloride     cefTRIAXone (ROCEPHIN)  IV 1 g (05/21/21 0854)   PRN Meds: sodium chloride, sodium chloride, acetaminophen **OR** acetaminophen, alteplase, heparin, lidocaine (PF), lidocaine-prilocaine, pentafluoroprop-tetrafluoroeth   Vital Signs    Vitals:   05/21/21 0400 05/21/21 0500 05/21/21  0550 05/21/21 0630  BP: (!) 179/87   (!) 165/75  Pulse: 86  81 74  Resp:   (!) 22 17  Temp:      TempSrc:      SpO2: 90%  93% 93%  Weight:  60.7 kg    Height:        Intake/Output Summary (Last 24 hours) at 05/21/2021 1117 Last data filed at 05/21/2021 0600 Gross per 24 hour  Intake 605.33 ml  Output 1550 ml  Net -944.67 ml   Filed Weights   05/19/21 0414 05/20/21 0500 05/21/21 0500  Weight: 56.4 kg 59 kg 60.7 kg    Telemetry    SR with short run of atrial tachycardia, PACs, PVCs - Personally Reviewed  ECG    No new tracings - Personally Reviewed  Physical Exam   GEN: No acute distress.  Cachectic appearing. Neck: No JVD. Cardiac: RRR, I/VI systolic murmur LLSB, no rubs, or gallops.  Respiratory: Clear to auscultation bilaterally.  GI: Soft, nontender, non-distended.   MS: No stump edema status post bilateral BKA. Neuro:  Alert and oriented x 3; Nonfocal.  Psych: Normal affect.  Labs    Chemistry Recent Labs  Lab 05/19/21 0405 05/20/21 0437 05/21/21 0522  NA 137 130* 137  K 5.3* 5.6* 4.1  CL 97* 94* 96*  CO2 26 25 30  GLUCOSE 200* 329* 190*  BUN 60* 88* 44*  CREATININE 6.88* 7.81* 4.76*  CALCIUM 8.5* 7.9* 8.2*  GFRNONAA 9* 7* 13*  ANIONGAP 14 11 11      Hematology Recent Labs  Lab 05/19/21 0405 05/20/21 0437 05/20/21 1420 05/21/21 0522  WBC 6.1 9.2  --  9.9  RBC 2.76* 2.24*  --  3.27*  HGB 7.2* 5.8* 7.6* 8.8*  HCT 23.5* 19.0* 23.3* 28.1*  MCV 85.1 84.8  --  85.9  MCH 26.1 25.9*  --  26.9  MCHC 30.6 30.5  --  31.3  RDW 20.3* 19.9*  --  19.1*  PLT 93* 122*  --  178    Cardiac EnzymesNo results for input(s): TROPONINI in the last 168 hours. No results for input(s): TROPIPOC in the last 168 hours.   BNP Recent Labs  Lab 05/18/21 0143  BNP >4,500.0*     DDimer  Recent Labs  Lab 05/19/21 1346  DDIMER 1.22*     Radiology    CT Angio Chest Pulmonary Embolism (PE) W or WO Contrast  Result Date: 05/20/2021 IMPRESSION: No evidence of  pulmonary embolus. Cardiomegaly, coronary artery disease. Small bilateral effusions. Bilateral lower lobe airspace opacities with some improvement on the left since prior study. Findings could reflect edema or infection. Aortic Atherosclerosis (ICD10-I70.0). Electronically Signed   By: Rolm Baptise M.D.   On: 05/20/2021 08:37    Cardiac Studies   2D echo 04/2021: 1. Left ventricular ejection fraction, by estimation, is 30 to 35%. The  left ventricle has moderately decreased function. The left ventricle  demonstrates global hypokinesis. The left ventricular internal cavity size  was mildly dilated. There is mild  left ventricular hypertrophy. Left ventricular diastolic parameters are  consistent with Grade II diastolic dysfunction (pseudonormalization).  Elevated left atrial pressure.   2. Right ventricular systolic function is moderately reduced. The right  ventricular size is moderately enlarged. There is normal pulmonary artery  systolic pressure.   3. Left atrial size was severely dilated.   4. Large pleural effusion in the left lateral region.   5. The mitral valve is abnormal. Mild to moderate mitral valve  regurgitation. No evidence of mitral stenosis. Moderate mitral annular  calcification.   6. Tricuspid valve regurgitation is moderate.   7. The aortic valve is tricuspid. There is moderate calcification of the  aortic valve. There is moderate thickening of the aortic valve. Aortic  valve regurgitation is not visualized. Mild to moderate aortic valve  sclerosis/calcification is present,  without any evidence of aortic stenosis.   8. The inferior vena cava is normal in size with <50% respiratory  variability, suggesting right atrial pressure of 8 mmHg.  __________  2D echo 08/2020: 1. Left ventricular ejection fraction, by estimation, is 40 to 45%. The  left ventricle has mildly decreased function. The left ventricle  demonstrates global hypokinesis. There is moderate concentric  left  ventricular hypertrophy. Left ventricular  diastolic parameters are consistent with Grade II diastolic dysfunction  (pseudonormalization). Elevated left atrial pressure.   2. Right ventricular systolic function is mildly reduced. The right  ventricular size is normal. Tricuspid regurgitation signal is inadequate  for assessing PA pressure.   3. Left atrial size was mildly dilated.   4. Right atrial size was mildly dilated.   5. The mitral valve is normal in structure. Trivial mitral valve  regurgitation.   6. The aortic valve is tricuspid. There is mild calcification of the  aortic valve. There is mild thickening of  the aortic valve. Aortic valve  regurgitation is not visualized. Mild aortic valve sclerosis is present,  with no evidence of aortic valve  stenosis.   7. Aortic dilatation noted. There is mild dilatation of the ascending  aorta, measuring 40 mm.   8. The inferior vena cava is normal in size with <50% respiratory  variability, suggesting right atrial pressure of 8 mmHg.   Patient Profile     60 y.o. male with history of ESRD on HD M WF, ICA, stroke, DM2, seizure disorder, PAD status post bilateral BKA, DVT, HTN, HLD, anemia of chronic disease, and marijuana use who we are seeing for acute on chronic HFrEF.  Assessment & Plan    1.  Acute on chronic HFrEF with left-sided pleural effusion status post thoracentesis on 8/1 with 1 L of fluid removed: -Echo this admission shows a worsening cardiomyopathy with an EF of 35% with prior echo in 08/2020 demonstrating an EF of 45% -Etiology of his cardiomyopathy remains uncertain, though there is high suspicion for ischemic substrate given multiple comorbid conditions -Volume management per hemodialysis -Continue current GDMT including carvedilol and Imdur/isosorbide -Clonidine stopped as outlined below given soft BP -Not currently on ACE inhibitor/ARB/MRA/ARNI/SGLT2i given relative hypotension and ESRD status -Currently  unable to pursue [facility] given transfusion dependent severe anemia -We will need to revisit ischemic evaluation at a later date, possibly as an outpatient -With regards to his pleural effusion, cytology was lymphocyte predominant with further evaluation deferred to internal medicine  2.  Anemia of chronic disease: -Presenting hemoglobin of 5.8, now improved to 8.8 status post PRBC -Maintain Hgb greater than 8  3.  ESRD on HD: -Volume management per hemodialysis  4.  HTN: -Blood pressure is typically been elevated in the 160s to 170s, however following morning medications BP has been soft in the low 100s to 90s -Hold clonidine -Place hold parameters on hydralazine -He remains on carvedilol and Isordil as outlined above  5.  Cachexia: -Reports poor appetite  For questions or updates, please contact [place] Please consult [email or web address] for contact info under Cardiology/STEMI.    Signed, Christell Faith, PA-C [place] Pager: [phone] 05/21/2021, 11:17 AM

## 2021-05-21 NOTE — Progress Notes (Signed)
[facility]) Hospital Liaison RN note  Notified by Villa Herb, NP of patient/family request for [facility] Palliative services at time of discharge.  [facility] hospital liaison will follow patient for discharge disposition.  Please call with any questions.  Thank you, Margaretmary Eddy, BSN, RN [facility] Liaison [phone]

## 2021-05-21 NOTE — Progress Notes (Signed)
Daily Progress Note   Patient Name: [name]       Date: 05/21/2021 DOB: 11-06-1960  Age: 60 y.o. MRN#: [id number] Attending Physician: Max Sane, MD Primary Care Physician: [name], MD Admit Date: 05/18/2021  Reason for Consultation/Follow-up: Establishing goals of care  Subjective: Patient very alert this AM - tells me he feels well. Eating breakfast, feeding himself. See discussion below.  Length of Stay: 2  Current Medications: Scheduled Meds:   arformoterol  15 mcg Nebulization BID   vitamin C  500 mg Oral BID   aspirin  81 mg Oral Daily   atorvastatin  80 mg Oral Daily   azithromycin  500 mg Oral Daily   budesonide (PULMICORT) nebulizer solution  0.25 mg Nebulization BID   carvedilol  25 mg Oral BID   chlorhexidine  15 mL Mouth Rinse BID   Chlorhexidine Gluconate Cloth  6 each Topical Q0600   Chlorhexidine Gluconate Cloth  6 each Topical Q0600   epoetin (EPOGEN/PROCRIT) injection  10,000 Units Intravenous Q M,W,F-HD   feeding supplement (NEPRO CARB STEADY)  237 mL Oral TID BM   ferric citrate  420 mg Oral TID WC   gabapentin  400 mg Oral QHS   heparin  5,000 Units Subcutaneous Q8H   hydrALAZINE  50 mg Oral Q8H   insulin aspart  0-5 Units Subcutaneous QHS   insulin aspart  0-9 Units Subcutaneous TID WC   insulin detemir  8 Units Subcutaneous Daily   ipratropium-albuterol  3 mL Nebulization BID   isosorbide dinitrate  10 mg Oral TID   levETIRAcetam  500 mg Oral Daily   mouth rinse  15 mL Mouth Rinse q12n4p   multivitamin  1 tablet Oral QHS   sevelamer carbonate  1,600 mg Oral TID WC    Continuous Infusions:  sodium chloride     sodium chloride     cefTRIAXone (ROCEPHIN)  IV Stopped (05/21/21 0937)    PRN Meds: sodium chloride, sodium chloride, acetaminophen  **OR** acetaminophen, alteplase, heparin, lidocaine (PF), lidocaine-prilocaine, pentafluoroprop-tetrafluoroeth  Physical Exam Constitutional:      General: He is not in acute distress. Pulmonary:     Effort: Pulmonary effort is normal.  Skin:    General: Skin is warm and dry.  Neurological:     Mental Status: He is alert and oriented to person, place, and time.            Vital Signs: BP 111/62   Pulse 69   Temp 98.4 F (36.9 C) (Axillary)   Resp (!) 23   Ht 5' 11"  (1.803 m)   Wt 60.7 kg   SpO2 95%   BMI 18.66 kg/m  SpO2: SpO2: 95 % O2 Device: O2 Device: Room Air O2 Flow Rate: O2 Flow Rate (L/min): 2 L/min  Intake/output summary:  Intake/Output Summary (Last 24 hours) at 05/21/2021 1317 Last data filed at 05/21/2021 1000 Gross per 24 hour  Intake 574 ml  Output 1575 ml  Net -1001 ml   LBM: Last BM Date: 05/15/21 Baseline Weight: Weight: 56.7 kg Most recent weight: Weight: 60.7 kg       Palliative Assessment/Data: PPS 40%    Flowsheet Rows  Flowsheet Row Most Recent Value  Intake Tab   Referral Department Nephrology  Unit at Time of Referral ICU  Palliative Care Primary Diagnosis Nephrology  Date Notified 05/19/21  Palliative Care Type New Palliative care  Reason for referral Clarify Goals of Care  Date of Admission 05/18/21  Date first seen by Palliative Care 05/20/21  # of days Palliative referral response time 1 Day(s)  # of days IP prior to Palliative referral 1  Clinical Assessment   Psychosocial & Spiritual Assessment   Palliative Care Outcomes        Patient Active Problem List   Diagnosis Date Noted   Acute decompensated heart failure ([place]) 05/19/2021   Acute respiratory failure with hypoxia ([place]) 05/18/2021   Protein-calorie malnutrition, severe 05/14/2021   Acute pulmonary edema ([facility])    Shortness of breath    Fluid overload 05/12/2021   Cellulitis and abscess of buttock    History of GI bleed 02/06/2021   Long term current use of  immunosuppressive drug 02/06/2021   Disorder of skin due to Crohn's disease ([place]) 88/41/6606   Complication of vascular access for dialysis 01/22/2021   Polyp of transverse colon    Acute metabolic encephalopathy 30/16/0109   Acute on chronic anemia 32/35/5732   Chronic systolic CHF (congestive heart failure) ([place]) 11/18/2020   Cerebrovascular disease 09/12/2020   Transient alteration of awareness 09/12/2020   Hyperkalemia 09/12/2020   Mild cognitive impairment 09/30/2019   Peripheral vascular disease ([place]) 06/28/2018   Sepsis ([place]) 01/12/2018   Pressure injury of skin 12/04/2017   S/P bilateral BKA (below knee amputation) ([place]) 10/20/2017   Atherosclerosis of native arteries of extremity with rest pain ([place]) 08/05/2017   History of CVA (cerebrovascular accident) 04/15/2017   Seizure disorder ([place]) 04/15/2017   End stage renal failure on dialysis ([place]) 04/12/2016   Aphthae 02/20/2016   Hidradenitis suppurativa 02/20/2016   Leg pain 02/20/2016   Neuropathy 02/20/2016   Narrowing of intervertebral disc space 08/29/2015   Vascular disorder of lower extremity 08/29/2015   Failure of erection 08/29/2015   Hypercholesteremia 08/29/2015   Hypertension 08/29/2015   Anemia due to chronic kidney disease 05/26/2015   Venous insufficiency of leg 09/04/2014   History of deep vein thrombosis (DVT) of lower extremity 08/16/2014   Prostatic intraepithelial neoplasia 11/02/2013   Elevated prostate specific antigen (PSA) 09/11/2013   Benign prostatic hyperplasia with urinary obstruction 08/13/2013   Spermatocele 08/13/2013   Avitaminosis D 01/25/2013   Type 2 diabetes mellitus with kidney complication, with long-term current use of insulin ([place]) 03/28/2012   Crohn disease ([place]) 08/03/2011    Palliative Care Assessment & Plan   HPI: 60 y.o. male  with past medical history of ESRD on HD, HTN, seizures, anemia, systolic heart failure, CVA, Crohns,  hiradenitis on [name], PAD, and bilateral BKA  admitted on 05/18/2021 with shortness of breath. Patient was recently admitted and discharged about 2 days ago after being treated for fluid overload and possible pneumonia. This admission, patient treated for acute respiratory failure acute CHF. CT negative for PE. PMT consulted by Dr. Juleen China for [facility] discussion.   Assessment: Follow up today with patient - he is sitting up in bed, alert, pleasant, feeding himself breakfast. He is oriented to self, place, and situation. He tells me he feels good. He tells me he tolerated HD well yesterday.  We discussed his reason for hospital admission; discussed his weak heart. He tells me he understands his diagnosis.   We discussed some concern that  maybe he did not want to continue HD anymore. We discussed concern of repeated hospitalization d/t his chronic medical conditions. We discussed options of continuing current care vs choosing a comfort focused path. Patient shares that he does want to continue HD. Patient tells me he is not ready for support of hospice. We discuss concern that going home may not be a good option, discuss going to rehab - he tells me he would agree to this. Discussed code status - at this time patient is interested in full code.   Spoke with patient's wife via telephone. She shares with me that after speaking to patient's children and patient she would look to pursue rehab. She would like to continue HD and is not ready for hospice at this time. We reviewed patient's statements to me this morning - review that they seem to agree with each other in their desires for his care moving forward.  Wife shares with me facilities she would be interested in for placement of patient - these were relayed to [facility] team.   Discussed outpatient palliative referral - wife is interested in this.   Recommendations/Plan: Patient/wife/family no longer considering comfort focused path, would like to maintain full code, continue HD, and pursue rehab Will refer  to outpatient palliative Discussed with Dr. Manuella Ghazi and [facility]  Goals of Care and Additional Recommendations: Limitations on Scope of Treatment: Full Scope Treatment  Code Status: Full code  Discharge Planning: [place] for rehab with Palliative care service follow-up  Care plan was discussed with patient, wife, Dr. Manuella Ghazi, Dr. Juleen China, Dr. Lanney [name], [facility] team  Thank you for allowing the Palliative Medicine Team to assist in the care of this patient.   Total Time 50 minutes Prolonged Time Billed  no       Greater than 50%  of this time was spent counseling and coordinating care related to the above assessment and plan.  Juel Burrow, DNP, [facility] Palliative Medicine Team Team Phone # [phone]  Pager [phone]

## 2021-05-21 NOTE — Evaluation (Signed)
Physical Therapy Evaluation Patient Details Name: [name] MRN: [id number] DOB: 05-Jun-1961 Today's Date: 05/21/2021   History of Present Illness  Patient is a 60 y.o. black male with end stage renal disease on hemodialysis, diabetes mellitus type II, hypertension, CVA, peripheral vascular disease status post bilateral BKA and ESRD on dialysis. Patient presented to ED with shortness of breath and cough. Patient had rapid response called while in hemodialysis requiring transfer to ICU. Acute respiratory failure, weaned from BiPAP. Status post left thoracentesis on 8/1. CT chest negative for PE.   Clinical Impression  Patient is alert and cooperative with evaluation. Patient able to follow single step commands consistently with extra time and oriented x 3. Patient requires assistance for bed mobility. Educated provided on importance of routine repositioning for skin integrity and assisting staff with positioning changes and basic bed level care. Patient was able to sit upright on edge of bed for ~ 5-7 minutes with mild dizziness reported initially with sitting upright. He required Min guard assistance with loss of balance to right that required increased assistance with dynamic sitting activity. Patient able to perform one scoot transfer to right side with assistance using UE support.  Patient is likely not at his baseline level of functional mobility. Recommend PT to maximize independence and to decrease caregiver burden. SNF is recommended at discharge.     Follow Up Recommendations SNF    Equipment Recommendations  None recommended by PT    Recommendations for Other Services       Precautions / Restrictions Precautions Precautions: Fall Restrictions Weight Bearing Restrictions: No      Mobility  Bed Mobility Overal bed mobility: Needs Assistance Bed Mobility: Supine to Sit;Sit to Supine     Supine to sit: Mod assist Sit to supine: Max assist   General bed mobility comments:  assistance for trunk support and BLE support provided. cues for technique    Transfers Overall transfer level: Needs assistance   Transfers: Lateral/Scoot Transfers          Lateral/Scoot Transfers: Max assist General transfer comment: patient able to perform incremental scoot transfer to right with assistance. patient able to use UE support for hip clearance with cues to avoid scooting due to possible shearing and to protect sacral wound that was present  Ambulation/Gait                Stairs            Wheelchair Mobility    Modified Rankin (Stroke Patients Only)       Balance Overall balance assessment: Needs assistance Sitting-balance support: Bilateral upper extremity supported;No upper extremity supported;Feet unsupported Sitting balance-Leahy Scale: Poor Sitting balance - Comments: patient able to maintain sitting balance for brief periods of Min guard/SBA. he does have loss of balance to right side occasionally with dynamic activity that required Min A- Mod A to correct to midline                                     Pertinent Vitals/Pain Pain Assessment: No/denies pain    Home Living Family/patient expects to be discharged to:: Private residence Living Arrangements: Spouse/significant other Available Help at Discharge: Family Type of Home: House Home Access: Ramped entrance     Home Layout: One level Home Equipment: Tub bench;Walker - 2 wheels;Crutches;Bedside commode;Wheelchair - manual (prosthesis, transfer board)      Prior Function Level of Independence: Needs  assistance   Gait / Transfers Assistance Needed: pivot transfer using prosthesis, intermittent assistance from spouse  ADL's / Homemaking Assistance Needed: patient required assistance for ADLs from spouse, including increased assistance for feeding recently        Hand Dominance   Dominant Hand: Right    Extremity/Trunk Assessment   Upper Extremity  Assessment Upper Extremity Assessment: Generalized weakness    Lower Extremity Assessment Lower Extremity Assessment: Generalized weakness (patient able to SLR and perform hip add/abd with AAROM. endurance impaired for sustianed activity. previous bilateral BKA with knee AROM grossly WFL observed with functional activity)       Communication   Communication: No difficulties  Cognition Arousal/Alertness: Awake/alert Behavior During Therapy: WFL for tasks assessed/performed Overall Cognitive Status: Within Functional Limits for tasks assessed                                 General Comments: patient able to follow single step commands with extra time. oriented to person, place, and time (by looking on the cell phone).      General Comments General comments (skin integrity, edema, etc.): vitals monitored throughout without significant change    Exercises     Assessment/Plan    PT Assessment Patient needs continued PT services  PT Problem List Decreased strength;Decreased activity tolerance;Decreased balance;Decreased mobility;Decreased knowledge of use of DME;Decreased safety awareness       PT Treatment Interventions DME instruction;Functional mobility training;Therapeutic activities;Therapeutic exercise;Balance training;Patient/family education;Wheelchair mobility training    PT Goals (Current goals can be found in the Care Plan section)  Acute Rehab PT Goals Patient Stated Goal: to get stronger PT Goal Formulation: With patient Time For Goal Achievement: 06/04/21 Potential to Achieve Goals: Good    Frequency Min 2X/week   Barriers to discharge        Co-evaluation PT/OT/SLP Co-Evaluation/Treatment: Yes Reason for Co-Treatment: Complexity of the patient's impairments (multi-system involvement) PT goals addressed during session: Mobility/safety with mobility OT goals addressed during session: ADL's and self-care       AM-PAC PT "6 Clicks" Mobility   Outcome Measure Help needed turning from your back to your side while in a flat bed without using bedrails?: A Little Help needed moving from lying on your back to sitting on the side of a flat bed without using bedrails?: A Lot Help needed moving to and from a bed to a chair (including a wheelchair)?: Total Help needed standing up from a chair using your arms (e.g., wheelchair or bedside chair)?: Total Help needed to walk in hospital room?: Total Help needed climbing 3-5 steps with a railing? : Total 6 Click Score: 9    End of Session   Activity Tolerance: Patient tolerated treatment well Patient left: in bed;with call bell/phone within reach;with family/visitor present;with bed alarm set (bed in semi-chair position and patient feeding himself breakfast. son at the bedside)   PT Visit Diagnosis: Muscle weakness (generalized) (M62.81);Other abnormalities of gait and mobility (R26.89)    Time: 7341-9379 PT Time Calculation (min) (ACUTE ONLY): 26 min   Charges:   PT Evaluation $PT Eval Moderate Complexity: 1 Mod PT Treatments $Therapeutic Activity: 8-22 mins        Minna Merritts, PT, MPT   Percell Locus 05/21/2021, 12:05 PM

## 2021-05-21 NOTE — Plan of Care (Signed)
Pt turned and repositioned Q2H this shift, he is less sleepy today, appetite somewhat improved, but intake is likely still less than optimal.  No complaints of SOB on room air this shift.  He tolerates his meds crushed in applesauce much better than whole with water, he is oriented, just forgetful of the date.  No UOP this shift, very small smear of stool this AM.

## 2021-05-21 NOTE — Evaluation (Signed)
Occupational Therapy Evaluation Patient Details Name: [name] MRN: [id number] DOB: 01/03/1961 Today's Date: 05/21/2021    History of Present Illness Patient is a 60 y.o. black male with end stage renal disease on hemodialysis, diabetes mellitus type II, hypertension, CVA, peripheral vascular disease status post bilateral BKA and ESRD on dialysis. Patient presented to ED with shortness of breath and cough. Patient had rapid response called while in hemodialysis requiring transfer to ICU. Acute respiratory failure, weaned from BiPAP. Status post left thoracentesis on 8/1. CT chest negative for PE.   Clinical Impression   Pt seen for OT evaluation this date in setting of acute hospitalization d/t acute respiratory failure. Pt reporting he is feeling better today and son confirms that pt is more awake and alert. Pt's spouse consulted for most PLOF information and reports that pt waxes and wanes with being able to transfer himself into/out of w/c with his prostheses on. States that she has to assist with bathing/dressing occasionally, but that pt waxes and wanes with his ability to get into/out of the shower and on/off the elevated commode in restroom. Pt presents this date with decreased strength and tolerance, requires MOD A for bed mobility and demos P static sitting balance requiring MIN A to sustain statis sit. Pt with R lateral lean noted. 2p assist utilized for safety with line mgt and d/t pt with very decreased tolerance. Pt requires MOD A +2 for assistance to propel toward [facility] for repositioning. Pt left with all needs met and in reach. Anticipate he will require STR f/u to ensure strength and mobility re at reasonable level for return to home environment.     Follow Up Recommendations  SNF    Equipment Recommendations  Other (comment) (defer to next level of care)    Recommendations for Other Services       Precautions / Restrictions Precautions Precautions:  Fall Restrictions Weight Bearing Restrictions: No      Mobility Bed Mobility Overal bed mobility: Needs Assistance Bed Mobility: Supine to Sit;Sit to Supine     Supine to sit: Mod assist;+2 for safety/equipment Sit to supine: Max assist;+2 for safety/equipment   General bed mobility comments: assist to support core/trunk in upright sitting    Transfers Overall transfer level: Needs assistance   Transfers: Lateral/Scoot Transfers          Lateral/Scoot Transfers: Max assist;+2 physical assistance;+2 safety/equipment General transfer comment: cues for hand placement    Balance Overall balance assessment: Needs assistance Sitting-balance support: Bilateral upper extremity supported;No upper extremity supported;Feet unsupported Sitting balance-Leahy Scale: Poor Sitting balance - Comments: patient able to maintain sitting balance for brief periods of Min guard/SBA. he does have loss of balance to right side occasionally with dynamic activity that required Min A- Mod A to correct to midline                                   ADL either performed or assessed with clinical judgement   ADL Overall ADL's : Needs assistance/impaired                                       General ADL Comments: SETUP to MIN A for seated UB ADLs as well as MIN A for seated balance suport, MOD/MAX A to simulate seated LB ADLs.     Vision Patient  Visual Report: No change from baseline Additional Comments: able to track, difficult to formally assess     Perception     Praxis      Pertinent Vitals/Pain Pain Assessment: No/denies pain     Hand Dominance Right   Extremity/Trunk Assessment Upper Extremity Assessment Upper Extremity Assessment: Generalized weakness   Lower Extremity Assessment Lower Extremity Assessment: Generalized weakness (h/o b/l BKAs. Appropriate hip abd/add and knee flex/ext for bed mobility)       Communication  Communication Communication: No difficulties   Cognition Arousal/Alertness: Awake/alert Behavior During Therapy: WFL for tasks assessed/performed Overall Cognitive Status: Within Functional Limits for tasks assessed                                 General Comments: patient able to follow single step commands with extra time. oriented to person, place, and time (by looking on the cell phone), some delayed responses/slow processing. Overall appropriate with simple tasks.   General Comments       Exercises Other Exercises Other Exercises: OT facilitates ed re: role of OT to pt, son and spouse, all with good understanding, pt with questionable carryover.   Shoulder Instructions      Home Living Family/patient expects to be discharged to:: Private residence Living Arrangements: Spouse/significant other Available Help at Discharge: Family Type of Home: House Home Access: [place]: One level     Bathroom Shower/Tub: Teacher, early years/pre: Handicapped height     Home Equipment: Tub bench;Walker - 2 wheels;Crutches;Bedside commode;Wheelchair - manual          Prior Functioning/Environment Level of Independence: Needs assistance  Gait / Transfers Assistance Needed: pivot transfer using prosthesis, intermittent assistance from spouse ADL's / Homemaking Assistance Needed: patient required assistance for ADLs from spouse, including increased assistance for feeding recently   Comments: Independent with RW for household distances, also uses w/c. Independent with ADLs. Family assists with IADLs        OT Problem List: Decreased strength;Decreased activity tolerance;Impaired balance (sitting and/or standing)      OT Treatment/Interventions: Self-care/ADL training;DME and/or AE instruction;Balance training;Therapeutic activities;Therapeutic exercise;Energy conservation;Patient/family education    OT Goals(Current goals can be found  in the care plan section) Acute Rehab OT Goals Patient Stated Goal: to get stronger OT Goal Formulation: With patient Time For Goal Achievement: 06/04/21 Potential to Achieve Goals: Fair ADL Goals Pt Will Perform Grooming: with supervision;sitting (unsupported sitting with F balance with use of UE support for ~6 mins to complete ~2g/h tasks to increase static sitting  tolerance) Pt Will Transfer to Toilet: with min guard assist;with min assist;bedside commode (slide versus squat pivot with prosthesis on) Pt/caregiver will Perform Home Exercise Program: Increased strength;Both right and left upper extremity;With Supervision Additional ADL Goal #1: Pt will tolerate unsupported sitting while complete theract versus self care to increase static sitting balance for ADLs and ADL transfers  OT Frequency: Min 1X/week   Barriers to D/C:            Co-evaluation PT/OT/SLP Co-Evaluation/Treatment: Yes Reason for Co-Treatment: Complexity of the patient's impairments (multi-system involvement) PT goals addressed during session: Mobility/safety with mobility OT goals addressed during session: ADL's and self-care      AM-PAC OT "6 Clicks" Daily Activity     Outcome Measure Help from another person eating meals?: A Little Help from another person taking care of personal grooming?: A Little  Help from another person toileting, which includes using toliet, bedpan, or urinal?: A Lot Help from another person bathing (including washing, rinsing, drying)?: A Lot Help from another person to put on and taking off regular upper body clothing?: A Little Help from another person to put on and taking off regular lower body clothing?: A Lot 6 Click Score: 15   End of Session Nurse Communication: Mobility status  Activity Tolerance: Patient tolerated treatment well Patient left: in bed;with call bell/phone within reach;with bed alarm set  OT Visit Diagnosis: Unsteadiness on feet (R26.81);Muscle weakness  (generalized) (M62.81)                Time: 2174-7159 OT Time Calculation (min): 29 min Charges:  OT General Charges $OT Visit: 1 Visit OT Evaluation $OT Eval Moderate Complexity: [place], [place], OTR/L ascom [phone] 05/21/21, 4:04 PM

## 2021-05-21 NOTE — Progress Notes (Signed)
[facility]  ROUNDING NOTE   Subjective:   Patient states he wants to continue dialysis and is not interested in hospice at this time.  Patient met with palliative care yesterday.  In good mood this morning.   PRBC transfusion yesterday.   Objective:  Vital signs in last 24 hours:  Temp:  [97.8 F (36.6 C)-98.7 F (37.1 C)] 98.4 F (36.9 C) (08/04 0800) Pulse Rate:  [60-86] 69 (08/04 1100) Resp:  [12-24] 23 (08/04 1000) BP: (100-179)/(57-87) 131/70 (08/04 1100) SpO2:  [84 %-100 %] 95 % (08/04 1100) Weight:  [60.7 kg] 60.7 kg (08/04 0500)  Weight change: 1.7 kg Filed Weights   05/19/21 0414 05/20/21 0500 05/21/21 0500  Weight: 56.4 kg 59 kg 60.7 kg    Intake/Output: I/O last 3 completed shifts: In: 1192.4 [P.O.:474; Blood:368.3; IV Piggyback:350] Out: 9169 [Urine:50; Other:1500]   Intake/Output this shift:  Total I/O In: 337 [P.O.:237; IV Piggyback:100] Out: 25 [Urine:25]  Physical Exam: General: NAD, laying in bed  Head: Normocephalic, atraumatic. Moist oral mucosal membranes  Eyes: Anicteric  Lungs:  Clear, room air  Heart: Regular rate and rhythm  Abdomen:  Soft, nontender  Extremities:  Bilateral BKA  Neurologic: Alert, and oriented  Skin: No lesions  Access: LUE AVF    Basic Metabolic Panel: Recent Labs  Lab 05/15/21 0649 05/18/21 0143 05/18/21 0547 05/19/21 0405 05/20/21 0437 05/21/21 0522  NA 136 134*  --  137 130* 137  K 4.0 4.6  --  5.3* 5.6* 4.1  CL 95* 95*  --  97* 94* 96*  CO2 28 27  --  26 25 30   GLUCOSE 204* 183*  --  200* 329* 190*  BUN 40* 49*  --  60* 88* 44*  CREATININE 5.29* 6.30* 6.48* 6.88* 7.81* 4.76*  CALCIUM 8.8* 8.7*  --  8.5* 7.9* 8.2*  PHOS  --   --   --  6.9*  --   --      Liver Function Tests: No results for input(s): AST, ALT, ALKPHOS, BILITOT, PROT, ALBUMIN in the last 168 hours.  No results for input(s): LIPASE, AMYLASE in the last 168 hours. No results for input(s): AMMONIA in the last 168  hours.  CBC: Recent Labs  Lab 05/18/21 0143 05/18/21 0547 05/19/21 0405 05/20/21 0437 05/20/21 1420 05/21/21 0522  WBC 6.1 6.9 6.1 9.2  --  9.9  NEUTROABS 4.5  --  5.2 6.9  --  7.8*  HGB 6.8* 7.8* 7.2* 5.8* 7.6* 8.8*  HCT 22.8* 25.9* 23.5* 19.0* 23.3* 28.1*  MCV 84.8 83.5 85.1 84.8  --  85.9  PLT 97* 123* 93* 122*  --  178     Cardiac Enzymes: No results for input(s): CKTOTAL, CKMB, CKMBINDEX, TROPONINI in the last 168 hours.  BNP: Invalid input(s): POCBNP  CBG: Recent Labs  Lab 05/20/21 1136 05/20/21 1557 05/20/21 1956 05/21/21 0739 05/21/21 1127  GLUCAP 229* 110* 150* 191* 153*     Microbiology: Results for orders placed or performed during the hospital encounter of 05/18/21  Resp Panel by RT-PCR (Flu A&B, Covid) Nasopharyngeal Swab     Status: None   Collection Time: 05/18/21  1:43 AM   Specimen: Nasopharyngeal Swab; Nasopharyngeal(NP) swabs in vial transport medium  Result Value Ref Range Status   SARS Coronavirus 2 by RT PCR NEGATIVE NEGATIVE Final    Comment: (NOTE) SARS-CoV-2 target nucleic acids are NOT DETECTED.  The SARS-CoV-2 RNA is generally detectable in upper respiratory specimens during the acute phase  of infection. The lowest concentration of SARS-CoV-2 viral copies this assay can detect is 138 copies/mL. A negative result does not preclude SARS-Cov-2 infection and should not be used as the sole basis for treatment or other patient management decisions. A negative result may occur with  improper specimen collection/handling, submission of specimen other than nasopharyngeal swab, presence of viral mutation(s) within the areas targeted by this assay, and inadequate number of viral copies(<138 copies/mL). A negative result must be combined with clinical observations, patient history, and epidemiological information. The expected result is Negative.  Fact Sheet for Patients:  EntrepreneurPulse.com.au  Fact Sheet for  Healthcare Providers:  IncredibleEmployment.be  This test is no t yet approved or cleared by the [place] FDA and  has been authorized for detection and/or diagnosis of SARS-CoV-2 by FDA under an Emergency Use Authorization (EUA). This EUA will remain  in effect (meaning this test can be used) for the duration of the COVID-19 declaration under Section 564(b)(1) of the Act, 21 U.S.C.section 360bbb-3(b)(1), unless the authorization is terminated  or revoked sooner.       Influenza A by PCR NEGATIVE NEGATIVE Final   Influenza B by PCR NEGATIVE NEGATIVE Final    Comment: (NOTE) The Xpert Xpress SARS-CoV-2/FLU/RSV plus assay is intended as an aid in the diagnosis of influenza from Nasopharyngeal swab specimens and should not be used as a sole basis for treatment. Nasal washings and aspirates are unacceptable for Xpert Xpress SARS-CoV-2/FLU/RSV testing.  Fact Sheet for Patients: EntrepreneurPulse.com.au  Fact Sheet for Healthcare Providers: IncredibleEmployment.be  This test is not yet approved or cleared by the [place] FDA and has been authorized for detection and/or diagnosis of SARS-CoV-2 by FDA under an Emergency Use Authorization (EUA). This EUA will remain in effect (meaning this test can be used) for the duration of the COVID-19 declaration under Section 564(b)(1) of the Act, 21 U.S.C. section 360bbb-3(b)(1), unless the authorization is terminated or revoked.  Performed at [facility], [address] [place], [place] [address]   Blood culture (routine x 2)     Status: None (Preliminary result)   Collection Time: 05/18/21  1:44 AM   Specimen: BLOOD  Result Value Ref Range Status   Specimen Description BLOOD RIGHT ANTECUBITAL  Final   Special Requests   Final    BOTTLES DRAWN AEROBIC AND ANAEROBIC Blood Culture adequate volume   Culture   Final    NO GROWTH 3 DAYS Performed at [facility], [address], [place], [place] [address]    Report Status PENDING  Incomplete  Blood culture (routine x 2)     Status: None (Preliminary result)   Collection Time: 05/18/21  1:44 AM   Specimen: BLOOD  Result Value Ref Range Status   Specimen Description BLOOD BLOOD RIGHT FOREARM  Final   Special Requests   Final    BOTTLES DRAWN AEROBIC AND ANAEROBIC Blood Culture adequate volume   Culture   Final    NO GROWTH 3 DAYS Performed at [facility], [address] [place],  [address]    Report Status PENDING  Incomplete  Acid Fast Smear (AFB)     Status: None   Collection Time: 05/18/21 11:25 AM   Specimen: PATH Cytology Pleural fluid  Result Value Ref Range Status   AFB Specimen Processing Concentration  Final   Acid Fast Smear Negative  Final    Comment: (NOTE) Performed At: [facility] [place], [place] [id number] Rush Farmer MD  IZ:[id number]    Source (AFB) PLEURAL  Final    Comment: Performed at [facility], [address] [place], [place] [address]  Body fluid culture w Gram Stain     Status: None   Collection Time: 05/18/21 11:25 AM   Specimen: PATH Cytology Pleural fluid  Result Value Ref Range Status   Specimen Description   Final    PLEURAL Performed at [facility], [address] [place], [place] [address]    Special Requests   Final    NONE Performed at [facility], [address] [place], [place] [address]    Gram Stain   Final    RARE WBC PRESENT, PREDOMINANTLY MONONUCLEAR NO ORGANISMS SEEN    Culture   Final    NO GROWTH 3 DAYS Performed at [facility], [place] [address] [place], [place] [address]    Report Status 05/21/2021 FINAL  Final  Respiratory (~20 pathogens) panel by PCR     Status: None   Collection Time: 05/20/21  7:41 AM   Specimen: Nasopharyngeal Swab; Respiratory  Result Value Ref Range Status   Adenovirus NOT DETECTED NOT DETECTED Final    Coronavirus 229E NOT DETECTED NOT DETECTED Final    Comment: (NOTE) The Coronavirus on the Respiratory Panel, DOES NOT test for the novel  Coronavirus (2019 nCoV)    Coronavirus HKU1 NOT DETECTED NOT DETECTED Final   Coronavirus NL63 NOT DETECTED NOT DETECTED Final   Coronavirus OC43 NOT DETECTED NOT DETECTED Final   Metapneumovirus NOT DETECTED NOT DETECTED Final   Rhinovirus / Enterovirus NOT DETECTED NOT DETECTED Final   Influenza A NOT DETECTED NOT DETECTED Final   Influenza B NOT DETECTED NOT DETECTED Final   Parainfluenza Virus 1 NOT DETECTED NOT DETECTED Final   Parainfluenza Virus 2 NOT DETECTED NOT DETECTED Final   Parainfluenza Virus 3 NOT DETECTED NOT DETECTED Final   Parainfluenza Virus 4 NOT DETECTED NOT DETECTED Final   Respiratory Syncytial Virus NOT DETECTED NOT DETECTED Final   Bordetella pertussis NOT DETECTED NOT DETECTED Final   Bordetella Parapertussis NOT DETECTED NOT DETECTED Final   Chlamydophila pneumoniae NOT DETECTED NOT DETECTED Final   Mycoplasma pneumoniae NOT DETECTED NOT DETECTED Final    Comment: Performed at [facility], [place]. [address], [place], [place] [address]    Coagulation Studies: No results for input(s): LABPROT, INR in the last 72 hours.  Urinalysis: No results for input(s): COLORURINE, LABSPEC, PHURINE, GLUCOSEU, HGBUR, BILIRUBINUR, KETONESUR, PROTEINUR, UROBILINOGEN, NITRITE, LEUKOCYTESUR in the last 72 hours.  Invalid input(s): APPERANCEUR    Imaging: CT Angio Chest Pulmonary Embolism (PE) W or WO Contrast  Result Date: 05/20/2021 CLINICAL DATA:  Shortness of breath.  PE suspected, high prob EXAM: CT ANGIOGRAPHY CHEST WITH CONTRAST TECHNIQUE: Multidetector CT imaging of the chest was performed using the standard protocol during bolus administration of intravenous contrast. Multiplanar CT image reconstructions and MIPs were obtained to evaluate the vascular anatomy. CONTRAST:  46m OMNIPAQUE IOHEXOL 350 MG/ML SOLN COMPARISON:   05/18/2021 FINDINGS: Cardiovascular: No filling defects in the pulmonary arteries to suggest pulmonary emboli. Extensive coronary artery calcifications and scattered aortic calcifications. No aneurysm. Heart is mildly enlarged. Mediastinum/Nodes: Mildly prominent mediastinal lymph nodes, similar to prior study. Esophageal wall thickening again noted in the mid esophagus. Trachea and thyroid unremarkable. Lungs/Pleura: Small bilateral pleural effusions, similar prior study. Bilateral lower lobe airspace opacities, with some improvement in the left lower lobe since prior study. Pleural calcifications in the right lower  hemithorax, stable. Upper Abdomen: Imaging into the upper abdomen demonstrates no acute findings. Musculoskeletal: Chest wall soft tissues are unremarkable. No acute bony abnormality. Review of the MIP images confirms the above findings. IMPRESSION: No evidence of pulmonary embolus. Cardiomegaly, coronary artery disease. Small bilateral effusions. Bilateral lower lobe airspace opacities with some improvement on the left since prior study. Findings could reflect edema or infection. Aortic Atherosclerosis (ICD10-I70.0). Electronically Signed   By: Rolm Baptise M.D.   On: 05/20/2021 08:37     Medications:    sodium chloride     sodium chloride     cefTRIAXone (ROCEPHIN)  IV Stopped (05/21/21 0937)    arformoterol  15 mcg Nebulization BID   vitamin C  500 mg Oral BID   aspirin  81 mg Oral Daily   atorvastatin  80 mg Oral Daily   azithromycin  500 mg Oral Daily   budesonide (PULMICORT) nebulizer solution  0.25 mg Nebulization BID   carvedilol  25 mg Oral BID   chlorhexidine  15 mL Mouth Rinse BID   Chlorhexidine Gluconate Cloth  6 each Topical Q0600   Chlorhexidine Gluconate Cloth  6 each Topical Q0600   epoetin (EPOGEN/PROCRIT) injection  10,000 Units Intravenous Q M,W,F-HD   feeding supplement (NEPRO CARB STEADY)  237 mL Oral TID BM   ferric citrate  420 mg Oral TID WC   gabapentin   400 mg Oral QHS   heparin  5,000 Units Subcutaneous Q8H   hydrALAZINE  50 mg Oral Q8H   insulin aspart  0-5 Units Subcutaneous QHS   insulin aspart  0-9 Units Subcutaneous TID WC   insulin detemir  8 Units Subcutaneous Daily   ipratropium-albuterol  3 mL Nebulization BID   isosorbide dinitrate  10 mg Oral TID   levETIRAcetam  500 mg Oral Daily   mouth rinse  15 mL Mouth Rinse q12n4p   multivitamin  1 tablet Oral QHS   sevelamer carbonate  1,600 mg Oral TID WC   sodium chloride, sodium chloride, acetaminophen **OR** acetaminophen, alteplase, heparin, lidocaine (PF), lidocaine-prilocaine, pentafluoroprop-tetrafluoroeth  Assessment/ Plan:  Mr. Che Rachal Fehrman is a 60 y.o. black male with end stage renal disease on hemodialysis, diabetes mellitus type II, hypertension, CVA, peripheral vascular disease status post bilateral BKA and ESRD on dialysis. Patient presented to ED with shortness of breath and cough. He has been admitted to [facility] for SOB (shortness of breath) [R06.02] Pleural effusion [J90] Healthcare-associated pneumonia [J18.9] Acute respiratory failure with hypoxia ([place]) [J96.01] Acute on chronic anemia [D64.9] Acute decompensated heart failure ([place]) [I50.9]   [facility]/MWF/LUE AVF  End stage renal disease on dialysis  - Dialysis on MWF, treatment for tomorrow.   2. Acute respiratory failure: requiring oxygen. Status post left thoracentesis on 8/1. 1 Liter removed.  - Continue supportive care - Stress dose steroids, supplemental oxgyen, nebs - Appreciate pulmonology input.   3. Anemia of chronic kidney disease : PRBC transfusion yesterday Lab Results  Component Value Date   HGB 8.8 (L) 05/21/2021  EPO with HD treatments.    4. Secondary Hyperparathyroidism: with hyperphosphatemia Lab Results  Component Value Date   PTH 283 (H) 01/08/2019   CALCIUM 8.2 (L) 05/21/2021   CAION 1.10 (L) 09/12/2020   PHOS 6.9 (H) 05/19/2021  - Auryxia and sevelamer with  meals.   5. Hypertension: Current regimen of clonidine, amlodipine, carvedilol, and hydralazine.      LOS: 2 Deaisha Welborn 8/4/202211:46 AM

## 2021-05-22 DIAGNOSIS — I5022 Chronic systolic (congestive) heart failure: Secondary | ICD-10-CM | POA: Diagnosis not present

## 2021-05-22 DIAGNOSIS — I509 Heart failure, unspecified: Secondary | ICD-10-CM | POA: Diagnosis not present

## 2021-05-22 DIAGNOSIS — J9601 Acute respiratory failure with hypoxia: Secondary | ICD-10-CM | POA: Diagnosis not present

## 2021-05-22 DIAGNOSIS — L89150 Pressure ulcer of sacral region, unstageable: Secondary | ICD-10-CM | POA: Diagnosis present

## 2021-05-22 DIAGNOSIS — D649 Anemia, unspecified: Secondary | ICD-10-CM | POA: Diagnosis not present

## 2021-05-22 DIAGNOSIS — I5023 Acute on chronic systolic (congestive) heart failure: Secondary | ICD-10-CM | POA: Diagnosis not present

## 2021-05-22 LAB — GLUCOSE, CAPILLARY
Glucose-Capillary: 157 mg/dL — ABNORMAL HIGH (ref 70–99)
Glucose-Capillary: 127 mg/dL — ABNORMAL HIGH (ref 70–99)
Glucose-Capillary: 156 mg/dL — ABNORMAL HIGH (ref 70–99)

## 2021-05-22 LAB — CBC WITH DIFFERENTIAL/PLATELET
Abs Immature Granulocytes: 0.46 10*3/uL — ABNORMAL HIGH (ref 0.00–0.07)
Basophils Absolute: 0 10*3/uL (ref 0.0–0.1)
Basophils Relative: 0 %
Eosinophils Absolute: 0.1 10*3/uL (ref 0.0–0.5)
Eosinophils Relative: 1 %
HCT: 26.5 % — ABNORMAL LOW (ref 39.0–52.0)
Hemoglobin: 8.3 g/dL — ABNORMAL LOW (ref 13.0–17.0)
Immature Granulocytes: 6 %
Lymphocytes Relative: 12 %
Lymphs Abs: 1 10*3/uL (ref 0.7–4.0)
MCH: 26.6 pg (ref 26.0–34.0)
MCHC: 31.3 g/dL (ref 30.0–36.0)
MCV: 84.9 fL (ref 80.0–100.0)
Monocytes Absolute: 0.5 10*3/uL (ref 0.1–1.0)
Monocytes Relative: 7 %
Neutro Abs: 5.9 10*3/uL (ref 1.7–7.7)
Neutrophils Relative %: 74 %
Platelets: 140 10*3/uL — ABNORMAL LOW (ref 150–400)
RBC: 3.12 MIL/uL — ABNORMAL LOW (ref 4.22–5.81)
RDW: 18.9 % — ABNORMAL HIGH (ref 11.5–15.5)
WBC: 8 10*3/uL (ref 4.0–10.5)
nRBC: 0 % (ref 0.0–0.2)

## 2021-05-22 LAB — BASIC METABOLIC PANEL
Anion gap: 11 (ref 5–15)
BUN: 59 mg/dL — ABNORMAL HIGH (ref 6–20)
CO2: 28 mmol/L (ref 22–32)
Calcium: 8 mg/dL — ABNORMAL LOW (ref 8.9–10.3)
Chloride: 95 mmol/L — ABNORMAL LOW (ref 98–111)
Creatinine, Ser: 6.08 mg/dL — ABNORMAL HIGH (ref 0.61–1.24)
GFR, Estimated: 10 mL/min — ABNORMAL LOW (ref >60)
Glucose, Bld: 164 mg/dL — ABNORMAL HIGH (ref 70–99)
Potassium: 4.6 mmol/L (ref 3.5–5.1)
Sodium: 134 mmol/L — ABNORMAL LOW (ref 135–145)

## 2021-05-22 LAB — TYPE AND SCREEN
ABO/RH(D): B POS
Antibody Screen: NEGATIVE
Unit division: 0
Unit division: 0

## 2021-05-22 LAB — BPAM RBC
Blood Product Expiration Date: 202208042359
Blood Product Expiration Date: 202208302359
ISSUE DATE / TIME: 202208031146
Unit Type and Rh: 7300
Unit Type and Rh: 9500

## 2021-05-22 LAB — PREPARE RBC (CROSSMATCH)

## 2021-05-22 MED ORDER — HYDRALAZINE HCL 50 MG PO TABS
50.0000 mg | ORAL_TABLET | Freq: Four times a day (QID) | ORAL | Status: DC
  Administered 2021-05-23 – 2021-05-24 (×6): 50 mg via ORAL
  Filled 2021-05-22 (×6): qty 1

## 2021-05-22 MED ORDER — LIDOCAINE-PRILOCAINE 2.5-2.5 % EX CREA
TOPICAL_CREAM | CUTANEOUS | Status: AC
  Filled 2021-05-22: qty 5

## 2021-05-22 NOTE — Progress Notes (Signed)
Patient arrived to unit from ICU in stable condition.

## 2021-05-22 NOTE — Progress Notes (Signed)
[facility]  ROUNDING NOTE   Subjective:   Patient seen during dialysis   HEMODIALYSIS FLOWSHEET:  Blood Flow Rate (mL/min): 400 mL/min Arterial Pressure (mmHg): -160 mmHg Venous Pressure (mmHg): 130 mmHg Transmembrane Pressure (mmHg): 60 mmHg Ultrafiltration Rate (mL/min): 760 mL/min Dialysate Flow Rate (mL/min): 500 ml/min Conductivity: Machine : 13.9 Conductivity: Machine : 13.9 Dialysis Fluid Bolus: Normal Saline Bolus Amount (mL): 300 mL Dialysate Change: 2K  Patient tolerating treatment No other complaints at this time   Objective:  Vital signs in last 24 hours:  Temp:  [98 F (36.7 C)-99 F (37.2 C)] 99 F (37.2 C) (08/05 1330) Pulse Rate:  [68-88] 81 (08/05 1341) Resp:  [12-26] 20 (08/05 1341) BP: (129-169)/(59-87) 152/77 (08/05 1341) SpO2:  [90 %-100 %] 100 % (08/05 1330)  Weight change:  Filed Weights   05/19/21 0414 05/20/21 0500 05/21/21 0500  Weight: 56.4 kg 59 kg 60.7 kg    Intake/Output: I/O last 3 completed shifts: In: 540 [P.O.:711; IV Piggyback:100] Out: 75 [Urine:75]   Intake/Output this shift:  Total I/O In: -  Out: 1100 [Other:1100]  Physical Exam: General: NAD, laying in bed  Head: Normocephalic, atraumatic. Moist oral mucosal membranes  Eyes: Anicteric  Lungs:  Clear, room air  Heart: Regular rate and rhythm  Abdomen:  Soft, nontender  Extremities:  Bilateral BKA  Neurologic: Alert, and oriented  Skin: No lesions  Access: LUE AVF    Basic Metabolic Panel: Recent Labs  Lab 05/18/21 0143 05/18/21 0547 05/19/21 0405 05/20/21 0437 05/21/21 0522 05/22/21 0603  NA 134*  --  137 130* 137 134*  K 4.6  --  5.3* 5.6* 4.1 4.6  CL 95*  --  97* 94* 96* 95*  CO2 27  --  26 25 30 28   GLUCOSE 183*  --  200* 329* 190* 164*  BUN 49*  --  60* 88* 44* 59*  CREATININE 6.30* 6.48* 6.88* 7.81* 4.76* 6.08*  CALCIUM 8.7*  --  8.5* 7.9* 8.2* 8.0*  PHOS  --   --  6.9*  --   --   --      Liver Function Tests: No results  for input(s): AST, ALT, ALKPHOS, BILITOT, PROT, ALBUMIN in the last 168 hours.  No results for input(s): LIPASE, AMYLASE in the last 168 hours. No results for input(s): AMMONIA in the last 168 hours.  CBC: Recent Labs  Lab 05/18/21 0143 05/18/21 0547 05/19/21 0405 05/20/21 0437 05/20/21 1420 05/21/21 0522 05/22/21 0603  WBC 6.1 6.9 6.1 9.2  --  9.9 8.0  NEUTROABS 4.5  --  5.2 6.9  --  7.8* 5.9  HGB 6.8* 7.8* 7.2* 5.8* 7.6* 8.8* 8.3*  HCT 22.8* 25.9* 23.5* 19.0* 23.3* 28.1* 26.5*  MCV 84.8 83.5 85.1 84.8  --  85.9 84.9  PLT 97* 123* 93* 122*  --  178 140*     Cardiac Enzymes: No results for input(s): CKTOTAL, CKMB, CKMBINDEX, TROPONINI in the last 168 hours.  BNP: Invalid input(s): POCBNP  CBG: Recent Labs  Lab 05/21/21 0739 05/21/21 1127 05/21/21 1635 05/21/21 2108 05/22/21 0722  GLUCAP 191* 153* 150* 125* 157*     Microbiology: Results for orders placed or performed during the hospital encounter of 05/18/21  Resp Panel by RT-PCR (Flu A&B, Covid) Nasopharyngeal Swab     Status: None   Collection Time: 05/18/21  1:43 AM   Specimen: Nasopharyngeal Swab; Nasopharyngeal(NP) swabs in vial transport medium  Result Value Ref Range Status   SARS Coronavirus 2  by RT PCR NEGATIVE NEGATIVE Final    Comment: (NOTE) SARS-CoV-2 target nucleic acids are NOT DETECTED.  The SARS-CoV-2 RNA is generally detectable in upper respiratory specimens during the acute phase of infection. The lowest concentration of SARS-CoV-2 viral copies this assay can detect is 138 copies/mL. A negative result does not preclude SARS-Cov-2 infection and should not be used as the sole basis for treatment or other patient management decisions. A negative result may occur with  improper specimen collection/handling, submission of specimen other than nasopharyngeal swab, presence of viral mutation(s) within the areas targeted by this assay, and inadequate number of viral copies(<138 copies/mL). A  negative result must be combined with clinical observations, patient history, and epidemiological information. The expected result is Negative.  Fact Sheet for Patients:  EntrepreneurPulse.com.au  Fact Sheet for Healthcare Providers:  IncredibleEmployment.be  This test is no t yet approved or cleared by the [place] FDA and  has been authorized for detection and/or diagnosis of SARS-CoV-2 by FDA under an Emergency Use Authorization (EUA). This EUA will remain  in effect (meaning this test can be used) for the duration of the COVID-19 declaration under Section 564(b)(1) of the Act, 21 U.S.C.section 360bbb-3(b)(1), unless the authorization is terminated  or revoked sooner.       Influenza A by PCR NEGATIVE NEGATIVE Final   Influenza B by PCR NEGATIVE NEGATIVE Final    Comment: (NOTE) The Xpert Xpress SARS-CoV-2/FLU/RSV plus assay is intended as an aid in the diagnosis of influenza from Nasopharyngeal swab specimens and should not be used as a sole basis for treatment. Nasal washings and aspirates are unacceptable for Xpert Xpress SARS-CoV-2/FLU/RSV testing.  Fact Sheet for Patients: EntrepreneurPulse.com.au  Fact Sheet for Healthcare Providers: IncredibleEmployment.be  This test is not yet approved or cleared by the [place] FDA and has been authorized for detection and/or diagnosis of SARS-CoV-2 by FDA under an Emergency Use Authorization (EUA). This EUA will remain in effect (meaning this test can be used) for the duration of the COVID-19 declaration under Section 564(b)(1) of the Act, 21 U.S.C. section 360bbb-3(b)(1), unless the authorization is terminated or revoked.  Performed at [name], [address] [place], [place] [address]   Blood culture (routine x 2)     Status: None (Preliminary result)   Collection Time: 05/18/21  1:44 AM   Specimen: BLOOD  Result Value Ref  Range Status   Specimen Description BLOOD RIGHT ANTECUBITAL  Final   Special Requests   Final    BOTTLES DRAWN AEROBIC AND ANAEROBIC Blood Culture adequate volume   Culture   Final    NO GROWTH 4 DAYS Performed at [facility], [address], [place], [place] [address]    Report Status PENDING  Incomplete  Blood culture (routine x 2)     Status: None (Preliminary result)   Collection Time: 05/18/21  1:44 AM   Specimen: BLOOD  Result Value Ref Range Status   Specimen Description BLOOD BLOOD RIGHT FOREARM  Final   Special Requests   Final    BOTTLES DRAWN AEROBIC AND ANAEROBIC Blood Culture adequate volume   Culture   Final    NO GROWTH 4 DAYS Performed at [facility], [address], [place], [place] [address]    Report Status PENDING  Incomplete  Acid Fast Smear (AFB)     Status: None   Collection Time: 05/18/21 11:25 AM   Specimen: PATH Cytology Pleural fluid  Result Value Ref Range Status  AFB Specimen Processing Concentration  Final   Acid Fast Smear Negative  Final    Comment: (NOTE) Performed At: [facility] [place], [place] [id number] Rush Farmer MD PQ:[id number]    Source (AFB) PLEURAL  Final    Comment: Performed at [facility], [address] [place], [place] [address]  Body fluid culture w Gram Stain     Status: None   Collection Time: 05/18/21 11:25 AM   Specimen: PATH Cytology Pleural fluid  Result Value Ref Range Status   Specimen Description   Final    PLEURAL Performed at [facility], [address] [place], [place] [address]    Special Requests   Final    NONE Performed at [facility], [address] [place], [place] [address]    Gram Stain   Final    RARE WBC PRESENT, PREDOMINANTLY MONONUCLEAR NO ORGANISMS SEEN    Culture   Final    NO GROWTH 3 DAYS Performed at [facility], [place] [address] [place], [place] [address]    Report Status 05/21/2021 FINAL   Final  Respiratory (~20 pathogens) panel by PCR     Status: None   Collection Time: 05/20/21  7:41 AM   Specimen: Nasopharyngeal Swab; Respiratory  Result Value Ref Range Status   Adenovirus NOT DETECTED NOT DETECTED Final   Coronavirus 229E NOT DETECTED NOT DETECTED Final    Comment: (NOTE) The Coronavirus on the Respiratory Panel, DOES NOT test for the novel  Coronavirus (2019 nCoV)    Coronavirus HKU1 NOT DETECTED NOT DETECTED Final   Coronavirus NL63 NOT DETECTED NOT DETECTED Final   Coronavirus OC43 NOT DETECTED NOT DETECTED Final   Metapneumovirus NOT DETECTED NOT DETECTED Final   Rhinovirus / Enterovirus NOT DETECTED NOT DETECTED Final   Influenza A NOT DETECTED NOT DETECTED Final   Influenza B NOT DETECTED NOT DETECTED Final   Parainfluenza Virus 1 NOT DETECTED NOT DETECTED Final   Parainfluenza Virus 2 NOT DETECTED NOT DETECTED Final   Parainfluenza Virus 3 NOT DETECTED NOT DETECTED Final   Parainfluenza Virus 4 NOT DETECTED NOT DETECTED Final   Respiratory Syncytial Virus NOT DETECTED NOT DETECTED Final   Bordetella pertussis NOT DETECTED NOT DETECTED Final   Bordetella Parapertussis NOT DETECTED NOT DETECTED Final   Chlamydophila pneumoniae NOT DETECTED NOT DETECTED Final   Mycoplasma pneumoniae NOT DETECTED NOT DETECTED Final    Comment: Performed at [facility], [place]. [address] [place],  [address]    Coagulation Studies: No results for input(s): LABPROT, INR in the last 72 hours.  Urinalysis: No results for input(s): COLORURINE, LABSPEC, PHURINE, GLUCOSEU, HGBUR, BILIRUBINUR, KETONESUR, PROTEINUR, UROBILINOGEN, NITRITE, LEUKOCYTESUR in the last 72 hours.  Invalid input(s): APPERANCEUR    Imaging: No results found.   Medications:    sodium chloride     sodium chloride     cefTRIAXone (ROCEPHIN)  IV Stopped (05/21/21 0937)    arformoterol  15 mcg Nebulization BID   vitamin C  500 mg Oral BID   aspirin  81 mg Oral Daily   atorvastatin  80 mg  Oral Daily   azithromycin  500 mg Oral Daily   budesonide (PULMICORT) nebulizer solution  0.25 mg Nebulization BID   carvedilol  25 mg Oral BID   Chlorhexidine Gluconate Cloth  6 each Topical Q0600   Chlorhexidine Gluconate Cloth  6 each Topical Q0600   epoetin (EPOGEN/PROCRIT) injection  10,000 Units Intravenous Q M,W,F-HD  feeding supplement (NEPRO CARB STEADY)  237 mL Oral TID BM   ferric citrate  420 mg Oral TID WC   gabapentin  400 mg Oral QHS   heparin  5,000 Units Subcutaneous Q8H   hydrALAZINE  50 mg Oral Q8H   insulin aspart  0-5 Units Subcutaneous QHS   insulin aspart  0-9 Units Subcutaneous TID WC   insulin detemir  8 Units Subcutaneous Daily   ipratropium-albuterol  3 mL Nebulization BID   isosorbide dinitrate  10 mg Oral TID   levETIRAcetam  500 mg Oral Daily   mouth rinse  15 mL Mouth Rinse q12n4p   multivitamin  1 tablet Oral QHS   sevelamer carbonate  1,600 mg Oral TID WC   sodium chloride, sodium chloride, acetaminophen **OR** acetaminophen, alteplase, heparin, lidocaine (PF), lidocaine-prilocaine, pentafluoroprop-tetrafluoroeth  Assessment/ Plan:  [name] is a 60 y.o. black male with end stage renal disease on hemodialysis, diabetes mellitus type II, hypertension, CVA, peripheral vascular disease status post bilateral BKA and ESRD on dialysis. Patient presented to ED with shortness of breath and cough. He has been admitted to [facility] for SOB (shortness of breath) [R06.02] Pleural effusion [J90] Healthcare-associated pneumonia [J18.9] Acute respiratory failure with hypoxia ([place]) [J96.01] Acute on chronic anemia [D64.9] Acute decompensated heart failure ([place]) [I50.9]   [facility]/MWF/LUE AVF  End stage renal disease on dialysis  - Dialysis on MWF, Received dialysis today, Uf 1.1L removed. Requested to terminate treatment early due to needing to use restroom. Next treatment scheduled for Monday  2. Acute respiratory failure: requiring oxygen.  Status post left thoracentesis on 8/1. 1 Liter removed.  - Continue supportive care - Stress dose steroids, supplemental oxgyen, nebs - Appreciate pulmonology input.   3. Anemia of chronic kidney disease : PRBC transfusion yesterday Lab Results  Component Value Date   HGB 8.3 (L) 05/22/2021  EPO with HD treatments.    4. Secondary Hyperparathyroidism: with hyperphosphatemia Lab Results  Component Value Date   PTH 283 (H) 01/08/2019   CALCIUM 8.0 (L) 05/22/2021   CAION 1.10 (L) 09/12/2020   PHOS 6.9 (H) 05/19/2021  - Auryxia and sevelamer with meals.   5. Hypertension: Current regimen of clonidine, amlodipine, carvedilol, and hydralazine.      LOS: 3   8/5/20222:06 PM

## 2021-05-22 NOTE — TOC Progression Note (Signed)
Transition of Care [facility]) - Progression Note    Patient Details  Name: NINO AMANO MRN: [id number] Date of Birth: Nov 06, 1960  Transition of Care [facility]) CM/SW Contact  Anselm Pancoast, RN Phone Number: 05/22/2021, 3:41 PM  Clinical Narrative:    Patient with no current bed offers for SNF. Broadened search-patient is stable for discharge today.    Expected Discharge Plan: [place] Barriers to Discharge: Continued Medical Work up  Expected Discharge Plan and Services Expected Discharge Plan: [place]   Discharge Planning Services: CM Consult Post Acute Care Choice: [place], Resumption of Svcs/PTA Provider Living arrangements for the past 2 months: Single Family Home                 DME Arranged: N/A DME Agency: NA       HH Arranged: RN, PT, OT, Nurse's Aide [place] Agency: [place] ([facility]) Date HH Agency Contacted: 05/18/21 Time [place]: 1452 Representative spoke with at [place]: [place] ([place]) Interventions    Readmission Risk Interventions Readmission Risk Prevention Plan 02/10/2021 02/08/2021 11/21/2020  Transportation Screening Complete Complete Complete  PCP or Specialist Appt within 5-7 Days - Complete -  Home Care Screening - Complete -  Medication Review (RN CM) - Complete -  HRI or Home Care Consult Complete - -  Palliative Care Screening Complete - -  Medication Review (RN Care Manager) Complete - -  Some recent data might be hidden

## 2021-05-22 NOTE — Progress Notes (Signed)
Pt HD tx started at 1057. In HD unit delay d/t pt request lidocaine cream. Drawer locked in Pyxis. Pharmacy notified, immediately responded and fixed drawer. Applied to patient arm for 10 minutes pre cannulation. HD started w/ no complications. 3 hr tx and 1.5 uf goal.

## 2021-05-22 NOTE — Progress Notes (Signed)
Pt HD ended at 1330. Pt ended tx 30 minutes early AMA. [place], MD aware. Patient wants to eat lunch and use the bathroom in his room. Refused snacks and urinal/bedpan in HD unit. Total uf removed was 1169m. VSS. Report to primary rn and ccmd notified of pt departure from HD unit. AVF positive for thrill and bruit post HD.

## 2021-05-22 NOTE — Progress Notes (Signed)
Progress Note  Patient Name: [name] Date of Encounter: 05/22/2021  Primary Cardiologist: New to [facility] - consult by End  Subjective   No chest pain or dyspnea. More alert today. HGB overall stable following pRBC transfusion on 8/3. BP briefly soft yesterday following administration of morning medications, leading clonidine to be held and hold parameters placed on hydralazine. BP has recovered into the 220U to 542H systolic. He did receive 2 doses of Coreg and hydralazine along with 3 doses of isosorbide yesterday.  Inpatient Medications    Scheduled Meds:  arformoterol  15 mcg Nebulization BID   vitamin C  500 mg Oral BID   aspirin  81 mg Oral Daily   atorvastatin  80 mg Oral Daily   azithromycin  500 mg Oral Daily   budesonide (PULMICORT) nebulizer solution  0.25 mg Nebulization BID   carvedilol  25 mg Oral BID   Chlorhexidine Gluconate Cloth  6 each Topical Q0600   Chlorhexidine Gluconate Cloth  6 each Topical Q0600   epoetin (EPOGEN/PROCRIT) injection  10,000 Units Intravenous Q M,W,F-HD   feeding supplement (NEPRO CARB STEADY)  237 mL Oral TID BM   ferric citrate  420 mg Oral TID WC   gabapentin  400 mg Oral QHS   heparin  5,000 Units Subcutaneous Q8H   hydrALAZINE  50 mg Oral Q8H   insulin aspart  0-5 Units Subcutaneous QHS   insulin aspart  0-9 Units Subcutaneous TID WC   insulin detemir  8 Units Subcutaneous Daily   ipratropium-albuterol  3 mL Nebulization BID   isosorbide dinitrate  10 mg Oral TID   levETIRAcetam  500 mg Oral Daily   mouth rinse  15 mL Mouth Rinse q12n4p   multivitamin  1 tablet Oral QHS   sevelamer carbonate  1,600 mg Oral TID WC   Continuous Infusions:  sodium chloride     sodium chloride     cefTRIAXone (ROCEPHIN)  IV Stopped (05/21/21 0937)   PRN Meds: sodium chloride, sodium chloride, acetaminophen **OR** acetaminophen, alteplase, heparin, lidocaine (PF), lidocaine-prilocaine, pentafluoroprop-tetrafluoroeth   Vital Signs     Vitals:   05/21/21 2100 05/22/21 0641 05/22/21 0700 05/22/21 0833  BP: (!) 159/69 (!) 167/76 (!) 169/79   Pulse: 71  85   Resp: 12     Temp:      TempSrc:      SpO2: 100%  90% 98%  Weight:      Height:        Intake/Output Summary (Last 24 hours) at 05/22/2021 0836 Last data filed at 05/21/2021 1600 Gross per 24 hour  Intake 574 ml  Output 0 ml  Net 574 ml    Filed Weights   05/19/21 0414 05/20/21 0500 05/21/21 0500  Weight: 56.4 kg 59 kg 60.7 kg    Telemetry    SR - Personally Reviewed  ECG    No new tracings - Personally Reviewed  Physical Exam   GEN: No acute distress.  Cachectic appearing. Neck: No JVD. Cardiac: RRR, I/VI systolic murmur LLSB, no rubs, or gallops.  Respiratory: Clear to auscultation bilaterally.  GI: Soft, nontender, non-distended.   MS: No stump edema status post bilateral BKA. Neuro:  Alert and oriented x 3; Nonfocal.  Psych: Normal affect.  Labs    Chemistry Recent Labs  Lab 05/20/21 0437 05/21/21 0522 05/22/21 0603  NA 130* 137 134*  K 5.6* 4.1 4.6  CL 94* 96* 95*  CO2 25 30 28   GLUCOSE 329* 190*  164*  BUN 88* 44* 59*  CREATININE 7.81* 4.76* 6.08*  CALCIUM 7.9* 8.2* 8.0*  GFRNONAA 7* 13* 10*  ANIONGAP 11 11 11       Hematology Recent Labs  Lab 05/20/21 0437 05/20/21 1420 05/21/21 0522 05/22/21 0603  WBC 9.2  --  9.9 8.0  RBC 2.24*  --  3.27* 3.12*  HGB 5.8* 7.6* 8.8* 8.3*  HCT 19.0* 23.3* 28.1* 26.5*  MCV 84.8  --  85.9 84.9  MCH 25.9*  --  26.9 26.6  MCHC 30.5  --  31.3 31.3  RDW 19.9*  --  19.1* 18.9*  PLT 122*  --  178 140*     Cardiac EnzymesNo results for input(s): TROPONINI in the last 168 hours. No results for input(s): TROPIPOC in the last 168 hours.   BNP Recent Labs  Lab 05/18/21 0143  BNP >4,500.0*      DDimer  Recent Labs  Lab 05/19/21 1346  DDIMER 1.22*      Radiology    CT Angio Chest Pulmonary Embolism (PE) W or WO Contrast  Result Date: 05/20/2021 IMPRESSION: No evidence of  pulmonary embolus. Cardiomegaly, coronary artery disease. Small bilateral effusions. Bilateral lower lobe airspace opacities with some improvement on the left since prior study. Findings could reflect edema or infection. Aortic Atherosclerosis (ICD10-I70.0). Electronically Signed   By: Rolm Baptise M.D.   On: 05/20/2021 08:37    Cardiac Studies   2D echo 04/2021: 1. Left ventricular ejection fraction, by estimation, is 30 to 35%. The  left ventricle has moderately decreased function. The left ventricle  demonstrates global hypokinesis. The left ventricular internal cavity size  was mildly dilated. There is mild  left ventricular hypertrophy. Left ventricular diastolic parameters are  consistent with Grade II diastolic dysfunction (pseudonormalization).  Elevated left atrial pressure.   2. Right ventricular systolic function is moderately reduced. The right  ventricular size is moderately enlarged. There is normal pulmonary artery  systolic pressure.   3. Left atrial size was severely dilated.   4. Large pleural effusion in the left lateral region.   5. The mitral valve is abnormal. Mild to moderate mitral valve  regurgitation. No evidence of mitral stenosis. Moderate mitral annular  calcification.   6. Tricuspid valve regurgitation is moderate.   7. The aortic valve is tricuspid. There is moderate calcification of the  aortic valve. There is moderate thickening of the aortic valve. Aortic  valve regurgitation is not visualized. Mild to moderate aortic valve  sclerosis/calcification is present,  without any evidence of aortic stenosis.   8. The inferior vena cava is normal in size with <50% respiratory  variability, suggesting right atrial pressure of 8 mmHg.  __________  2D echo 08/2020: 1. Left ventricular ejection fraction, by estimation, is 40 to 45%. The  left ventricle has mildly decreased function. The left ventricle  demonstrates global hypokinesis. There is moderate concentric  left  ventricular hypertrophy. Left ventricular  diastolic parameters are consistent with Grade II diastolic dysfunction  (pseudonormalization). Elevated left atrial pressure.   2. Right ventricular systolic function is mildly reduced. The right  ventricular size is normal. Tricuspid regurgitation signal is inadequate  for assessing PA pressure.   3. Left atrial size was mildly dilated.   4. Right atrial size was mildly dilated.   5. The mitral valve is normal in structure. Trivial mitral valve  regurgitation.   6. The aortic valve is tricuspid. There is mild calcification of the  aortic valve. There is mild thickening  of the aortic valve. Aortic valve  regurgitation is not visualized. Mild aortic valve sclerosis is present,  with no evidence of aortic valve  stenosis.   7. Aortic dilatation noted. There is mild dilatation of the ascending  aorta, measuring 40 mm.   8. The inferior vena cava is normal in size with <50% respiratory  variability, suggesting right atrial pressure of 8 mmHg.   Patient Profile     60 y.o. male with history of ESRD on HD M WF, ICA, stroke, DM2, seizure disorder, PAD status post bilateral BKA, DVT, HTN, HLD, anemia of chronic disease, and marijuana use who we are seeing for acute on chronic HFrEF.  Assessment & Plan    1.  Acute on chronic HFrEF with left-sided pleural effusion status post thoracentesis on 8/1 with 1 L of fluid removed: -Echo this admission shows a worsening cardiomyopathy with an EF of 35% with prior echo in 08/2020 demonstrating an EF of 45% -Etiology of his cardiomyopathy remains uncertain, though there is high suspicion for ischemic substrate given multiple comorbid conditions -Volume management per hemodialysis -Continue current GDMT including carvedilol and Imdur/isosorbide -Clonidine stopped 8/4 given soft BP, and to allow for titration of GDMT as tolerated  -Not currently on ACE inhibitor/ARB/MRA/ARNI/SGLT2i given relative  hypotension and ESRD status -Currently unable to pursue [facility] given transfusion dependent severe anemia -We will need to revisit ischemic evaluation at a later date, possibly as an outpatient -With regards to his pleural effusion, cytology was lymphocyte predominant with further evaluation deferred to internal medicine  2.  Anemia of chronic disease: -Presenting hemoglobin of 5.8, now improved to 8.3 status post PRBC on 8/3 -Maintain Hgb greater than 8  3.  ESRD on HD: -Volume management per hemodialysis  4.  HTN: -BP briefly soft on 8/4 leading to the holding of clonidine -Improved to hypertensive this morning -Continue current medical therapy as outlined above  5.  Cachexia: -Reports poor appetite  For questions or updates, please contact [place] Please consult [email or web address] for contact info under Cardiology/STEMI.    Signed, Christell Faith, PA-C [place] Pager: [phone] 05/22/2021, 8:36 AM

## 2021-05-22 NOTE — Progress Notes (Addendum)
PROGRESS NOTE    [name]  WNU:[id number] DOB: [birth year] DOA: 05/18/2021 PCP: [name], MD    Brief Narrative:   60 year old male history of ESRD on hemodialysis MWF, hypertension, seizure disorder, anemia, systolic chronic heart failure, hidradenitis on [name], bilateral BKA who presents to the ED for evaluation of worsening shortness of breath.  Patient was recently admitted and discharged on 7/29 for similar complaints.  Now presents with persistent fluid overload, possible pneumonia associated with parapneumonic effusion.  Patient is chronically ill and majority of history is gained by speaking with patient's wife.  Patient's wife states that shortness of breath progressively worsened after discharge.  Patient has no associated chest pain, productive cough, fever, chills.  Initially required 10 L oxygen to maintain saturation greater than 90.  Hemoglobin also dropped to 6.8 from baseline approximate.ly 8.  Started on antibiotics for pneumonia, thoracentesis ordered, nephrology consulted.  8/1: Received page from ICU charge RN.  Patient had rapid response called while in hemodialysis.  I went and evaluated the patient at bedside.  Found him to be on a nonrebreather with saturations in the low to mid 80s.  Work of breathing not especially increased.  Lung sounds very coarse and rhonchorous.  Unable to wean off nonrebreather.  Of note patient did get a thoracentesis with 1 L fluid removed today.  No fluid removal was attempted with hemodialysis.  Patient only completed about 30 minutes of his dialysis session.   Will be transferred to stepdown unit for closer monitoring.  BiPAP initiated.  COPD treatment started.  Case discussed at length with ICU attending.  Consultation greatly appreciated  8/2: Respiratory status much improved.  Patient weaned off BiPAP.  Saturating well on 2 L nasal cannula.  Stable for transfer to medical floor.  Cardiology consult requested and appreciated. 8/3:  Was not looking too good and family was debating for comfort care.  Hemoglobin dropped to 5.8 requiring 1 unit of packed RBC transfusion 8/4 patient much more alert, hemoglobin stabilized and eating.  Family not interested in hospice anymore and wants to continue dialysis   Assessment & Plan:   Principal Problem:   Acute respiratory failure with hypoxia (HCC) Active Problems:   Hypertension   Anemia due to chronic kidney disease   Crohn disease (HCC)   Hidradenitis suppurativa   Type 2 diabetes mellitus with kidney complication, with long-term current use of insulin (HCC)   End stage renal failure on dialysis ([place])   History of CVA (cerebrovascular accident)   Seizure disorder ([place])   S/P bilateral BKA (below knee amputation) ([place])   Acute on chronic anemia   Chronic systolic CHF (congestive heart failure) ([place])   Long term current use of immunosuppressive drug   Acute decompensated heart failure (HCC)   Unstageable pressure ulcer of sacral region ([place])  Acute respiratory failure with hypoxia -present on admission and now resolved RRT called during dialysis on 8/1 for worsening hypoxia requiring transfer to stepdown for BiPAP,  S/p thoracentesis on 8/1 with removal of 1 L of fluid Weaned off BiPAP as of 8/2, now weaned off nasal cannula oxygen and remains on room air. CT chest on 8/3 negative for PE  Acute on chronic anemia of chronic kidney disease Baseline hemoglobin in the eights Presented with hemoglobin 6.8-> 5.8-> 8.3 Status post 1 unit PRBC on 8/3 with dialysis  Community-acquired pneumonia Completed course of antibiotic.  Treated  Acute on chronic decompensated systolic and diastolic congestive heart failure Echo showing EF of  30 to 35%.  Ischemic work-up not planned by cardiology due to severe anemia.  Medical management recommended. clonidine stopped -Continue Coreg, hydralazine, isosorbide for now.    Essential hypertension Blood pressure trending up, clonidine  stopped -Continue Coreg, isosorbide. We will increase hydralazine to 50 mg p.o. 4 times daily (was on 3 times a day) for better blood pressure control  End-stage renal disease on hemodialysis HD per nephro.  Patient wants to continue  Hyperlipidemia Continue statin  Type 2 diabetes mellitus Continue sliding scale.  Continue insulin Levemir 8 units subcu daily  Seizure disorder Continue Keppra  History of hidradenitis  [name] on hold  Peripheral arterial disease status post bilateral BKA No acute issues  Severe Protein-calorie malnutrition As evidenced by muscle wasting Body mass index is 18.66 kg/m.   Goals of care Overall poor prognosis.   PT and OT does recommend SNF.  TOC aware and working on placement    DVT prophylaxis: SQ heparin Code Status: Full code Family Communication: Spouse updated at bedside on 8/5 Disposition Plan: Status is: Inpatient  Remains inpatient appropriate because:Inpatient level of care appropriate due to severity of illness  Dispo: The patient is from: Home              Anticipated d/c is to: SNF once bed available              Patient currently is medically stable to d/c.   Difficult to place patient No   Level of care: Med-Surg  Consultants:  Pulmonary Cardiology Nephrology  Procedures:  Thoracentesis 8/1  Antimicrobials:  Completed course of Rocephin and azithromycin   Subjective: Wants to get repositioned in the bed is having some back pain.  Appreciative of his care and agreeable to get started on therapy.  Objective: Vitals:   05/22/21 1328 05/22/21 1330 05/22/21 1341 05/22/21 1428  BP:  (!) 151/77 (!) 152/77 (!) 152/73  Pulse:  82 81 77  Resp: 18 18 20 20   Temp:  99 F (37.2 C)  98.6 F (37 C)  TempSrc:  Oral    SpO2:  100%  98%  Weight:      Height:        Intake/Output Summary (Last 24 hours) at 05/22/2021 1457 Last data filed at 05/22/2021 1330 Gross per 24 hour  Intake 237 ml  Output 1100 ml  Net -863  ml   Filed Weights   05/19/21 0414 05/20/21 0500 05/21/21 0500  Weight: 56.4 kg 59 kg 60.7 kg    Examination:  General exam: No acute distress.  Appears frail  Respiratory system: Bilateral coarse crackles.  Normal work of breathing.  2 L Cardiovascular system: S1-S2, regular rate and rhythm, 2/6 systolic murmur  gastrointestinal system: Thin, nontender, nondistended, normal bowel sounds Central nervous system: Alert and oriented.  No focal deficits Extremities: Bilateral BKA Skin: Scattered excoriations.  No ulcers or induration Psychiatry: Judgement and insight appear normal. Mood & affect appropriate.     Data Reviewed: I have personally reviewed following labs and imaging studies  CBC: Recent Labs  Lab 05/18/21 0143 05/18/21 0547 05/19/21 0405 05/20/21 0437 05/20/21 1420 05/21/21 0522 05/22/21 0603  WBC 6.1 6.9 6.1 9.2  --  9.9 8.0  NEUTROABS 4.5  --  5.2 6.9  --  7.8* 5.9  HGB 6.8* 7.8* 7.2* 5.8* 7.6* 8.8* 8.3*  HCT 22.8* 25.9* 23.5* 19.0* 23.3* 28.1* 26.5*  MCV 84.8 83.5 85.1 84.8  --  85.9 84.9  PLT 97* 123* 93* 122*  --  178 268*   Basic Metabolic Panel: Recent Labs  Lab 05/18/21 0143 05/18/21 0547 05/19/21 0405 05/20/21 0437 05/21/21 0522 05/22/21 0603  NA 134*  --  137 130* 137 134*  K 4.6  --  5.3* 5.6* 4.1 4.6  CL 95*  --  97* 94* 96* 95*  CO2 27  --  26 25 30 28   GLUCOSE 183*  --  200* 329* 190* 164*  BUN 49*  --  60* 88* 44* 59*  CREATININE 6.30* 6.48* 6.88* 7.81* 4.76* 6.08*  CALCIUM 8.7*  --  8.5* 7.9* 8.2* 8.0*  PHOS  --   --  6.9*  --   --   --    GFR: Estimated Creatinine Clearance: 11.4 mL/min (A) (by C-G formula based on SCr of 6.08 mg/dL (H)). Liver Function Tests: No results for input(s): AST, ALT, ALKPHOS, BILITOT, PROT, ALBUMIN in the last 168 hours. No results for input(s): LIPASE, AMYLASE in the last 168 hours. No results for input(s): AMMONIA in the last 168 hours. Coagulation Profile: No results for input(s): INR, PROTIME  in the last 168 hours. Cardiac Enzymes: No results for input(s): CKTOTAL, CKMB, CKMBINDEX, TROPONINI in the last 168 hours. BNP (last 3 results) No results for input(s): PROBNP in the last 8760 hours. HbA1C: No results for input(s): HGBA1C in the last 72 hours.  CBG: Recent Labs  Lab 05/21/21 0739 05/21/21 1127 05/21/21 1635 05/21/21 2108 05/22/21 0722  GLUCAP 191* 153* 150* 125* 157*   Lipid Profile: No results for input(s): CHOL, HDL, LDLCALC, TRIG, CHOLHDL, LDLDIRECT in the last 72 hours. Thyroid Function Tests: No results for input(s): TSH, T4TOTAL, FREET4, T3FREE, THYROIDAB in the last 72 hours. Anemia Panel: No results for input(s): VITAMINB12, FOLATE, FERRITIN, TIBC, IRON, RETICCTPCT in the last 72 hours. Sepsis Labs: Recent Labs  Lab 05/18/21 0143  PROCALCITON 1.09    Recent Results (from the past 240 hour(s))  Resp Panel by RT-PCR (Flu A&B, Covid) Nasopharyngeal Swab     Status: None   Collection Time: 05/12/21  9:03 PM   Specimen: Nasopharyngeal Swab; Nasopharyngeal(NP) swabs in vial transport medium  Result Value Ref Range Status   SARS Coronavirus 2 by RT PCR NEGATIVE NEGATIVE Final    Comment: (NOTE) SARS-CoV-2 target nucleic acids are NOT DETECTED.  The SARS-CoV-2 RNA is generally detectable in upper respiratory specimens during the acute phase of infection. The lowest concentration of SARS-CoV-2 viral copies this assay can detect is 138 copies/mL. A negative result does not preclude SARS-Cov-2 infection and should not be used as the sole basis for treatment or other patient management decisions. A negative result may occur with  improper specimen collection/handling, submission of specimen other than nasopharyngeal swab, presence of viral mutation(s) within the areas targeted by this assay, and inadequate number of viral copies(<138 copies/mL). A negative result must be combined with clinical observations, patient history, and  epidemiological information. The expected result is Negative.  Fact Sheet for Patients:  EntrepreneurPulse.com.au  Fact Sheet for Healthcare Providers:  IncredibleEmployment.be  This test is no t yet approved or cleared by the [place] FDA and  has been authorized for detection and/or diagnosis of SARS-CoV-2 by FDA under an Emergency Use Authorization (EUA). This EUA will remain  in effect (meaning this test can be used) for the duration of the COVID-19 declaration under Section 564(b)(1) of the Act, 21 U.S.C.section 360bbb-3(b)(1), unless the authorization is terminated  or revoked sooner.       Influenza A by PCR NEGATIVE  NEGATIVE Final   Influenza B by PCR NEGATIVE NEGATIVE Final    Comment: (NOTE) The Xpert Xpress SARS-CoV-2/FLU/RSV plus assay is intended as an aid in the diagnosis of influenza from Nasopharyngeal swab specimens and should not be used as a sole basis for treatment. Nasal washings and aspirates are unacceptable for Xpert Xpress SARS-CoV-2/FLU/RSV testing.  Fact Sheet for Patients: EntrepreneurPulse.com.au  Fact Sheet for Healthcare Providers: IncredibleEmployment.be  This test is not yet approved or cleared by the [place] FDA and has been authorized for detection and/or diagnosis of SARS-CoV-2 by FDA under an Emergency Use Authorization (EUA). This EUA will remain in effect (meaning this test can be used) for the duration of the COVID-19 declaration under Section 564(b)(1) of the Act, 21 U.S.C. section 360bbb-3(b)(1), unless the authorization is terminated or revoked.  Performed at [facility], [address] [place], [place] [address]   MRSA Next Gen by PCR, Nasal     Status: None   Collection Time: 05/12/21 11:06 PM   Specimen: Nasal Mucosa; Nasal Swab  Result Value Ref Range Status   MRSA by PCR Next Gen NOT DETECTED NOT DETECTED Final    Comment:  (NOTE) The GeneXpert MRSA Assay (FDA approved for NASAL specimens only), is one component of a comprehensive MRSA colonization surveillance program. It is not intended to diagnose MRSA infection nor to guide or monitor treatment for MRSA infections. Test performance is not FDA approved in patients less than 44 years old. Performed at [facility], [address] [place], [place] [address]   Culture, blood (routine x 2) Call MD if unable to obtain prior to antibiotics being given     Status: None   Collection Time: 05/12/21 11:54 PM   Specimen: BLOOD  Result Value Ref Range Status   Specimen Description BLOOD RIGHT FOREARM  Final   Special Requests   Final    BOTTLES DRAWN AEROBIC ONLY Blood Culture results may not be optimal due to an inadequate volume of blood received in culture bottles   Culture   Final    NO GROWTH 5 DAYS Performed at [facility], [address] [place],  [address]    Report Status 05/18/2021 FINAL  Final  Resp Panel by RT-PCR (Flu A&B, Covid) Nasopharyngeal Swab     Status: None   Collection Time: 05/18/21  1:43 AM   Specimen: Nasopharyngeal Swab; Nasopharyngeal(NP) swabs in vial transport medium  Result Value Ref Range Status   SARS Coronavirus 2 by RT PCR NEGATIVE NEGATIVE Final    Comment: (NOTE) SARS-CoV-2 target nucleic acids are NOT DETECTED.  The SARS-CoV-2 RNA is generally detectable in upper respiratory specimens during the acute phase of infection. The lowest concentration of SARS-CoV-2 viral copies this assay can detect is 138 copies/mL. A negative result does not preclude SARS-Cov-2 infection and should not be used as the sole basis for treatment or other patient management decisions. A negative result may occur with  improper specimen collection/handling, submission of specimen other than nasopharyngeal swab, presence of viral mutation(s) within the areas targeted by this assay, and inadequate number of  viral copies(<138 copies/mL). A negative result must be combined with clinical observations, patient history, and epidemiological information. The expected result is Negative.  Fact Sheet for Patients:  EntrepreneurPulse.com.au  Fact Sheet for Healthcare Providers:  IncredibleEmployment.be  This test is no t yet approved or cleared by the [place] FDA and  has been authorized for detection and/or diagnosis of SARS-CoV-2 by FDA under  an Emergency Use Authorization (EUA). This EUA will remain  in effect (meaning this test can be used) for the duration of the COVID-19 declaration under Section 564(b)(1) of the Act, 21 U.S.C.section 360bbb-3(b)(1), unless the authorization is terminated  or revoked sooner.       Influenza A by PCR NEGATIVE NEGATIVE Final   Influenza B by PCR NEGATIVE NEGATIVE Final    Comment: (NOTE) The Xpert Xpress SARS-CoV-2/FLU/RSV plus assay is intended as an aid in the diagnosis of influenza from Nasopharyngeal swab specimens and should not be used as a sole basis for treatment. Nasal washings and aspirates are unacceptable for Xpert Xpress SARS-CoV-2/FLU/RSV testing.  Fact Sheet for Patients: EntrepreneurPulse.com.au  Fact Sheet for Healthcare Providers: IncredibleEmployment.be  This test is not yet approved or cleared by the [place] FDA and has been authorized for detection and/or diagnosis of SARS-CoV-2 by FDA under an Emergency Use Authorization (EUA). This EUA will remain in effect (meaning this test can be used) for the duration of the COVID-19 declaration under Section 564(b)(1) of the Act, 21 U.S.C. section 360bbb-3(b)(1), unless the authorization is terminated or revoked.  Performed at [facility], [address] [place], [place] [address]   Blood culture (routine x 2)     Status: None (Preliminary result)   Collection Time: 05/18/21  1:44 AM    Specimen: BLOOD  Result Value Ref Range Status   Specimen Description BLOOD RIGHT ANTECUBITAL  Final   Special Requests   Final    BOTTLES DRAWN AEROBIC AND ANAEROBIC Blood Culture adequate volume   Culture   Final    NO GROWTH 4 DAYS Performed at [facility], [address], [place], [place] [address]    Report Status PENDING  Incomplete  Blood culture (routine x 2)     Status: None (Preliminary result)   Collection Time: 05/18/21  1:44 AM   Specimen: BLOOD  Result Value Ref Range Status   Specimen Description BLOOD BLOOD RIGHT FOREARM  Final   Special Requests   Final    BOTTLES DRAWN AEROBIC AND ANAEROBIC Blood Culture adequate volume   Culture   Final    NO GROWTH 4 DAYS Performed at [facility], [address] [place], [place] [address]    Report Status PENDING  Incomplete  Acid Fast Smear (AFB)     Status: None   Collection Time: 05/18/21 11:25 AM   Specimen: PATH Cytology Pleural fluid  Result Value Ref Range Status   AFB Specimen Processing Concentration  Final   Acid Fast Smear Negative  Final    Comment: (NOTE) Performed At: [facility] [place], [place] [id number] Rush Farmer MD IW:[id number]    Source (AFB) PLEURAL  Final    Comment: Performed at [facility], [address] [place], [place] [address]  Body fluid culture w Gram Stain     Status: None   Collection Time: 05/18/21 11:25 AM   Specimen: PATH Cytology Pleural fluid  Result Value Ref Range Status   Specimen Description   Final    PLEURAL Performed at [facility], [address] [place], [place] [address]    Special Requests   Final    NONE Performed at [facility], [address] [place], [place] [address]    Gram Stain   Final    RARE WBC PRESENT, PREDOMINANTLY MONONUCLEAR NO ORGANISMS SEEN    Culture   Final    NO GROWTH 3 DAYS Performed  at [facility], [place] [address], [place], [place] [address]     Report Status 05/21/2021 FINAL  Final  Respiratory (~20 pathogens) panel by PCR     Status: None   Collection Time: 05/20/21  7:41 AM   Specimen: Nasopharyngeal Swab; Respiratory  Result Value Ref Range Status   Adenovirus NOT DETECTED NOT DETECTED Final   Coronavirus 229E NOT DETECTED NOT DETECTED Final    Comment: (NOTE) The Coronavirus on the Respiratory Panel, DOES NOT test for the novel  Coronavirus (2019 nCoV)    Coronavirus HKU1 NOT DETECTED NOT DETECTED Final   Coronavirus NL63 NOT DETECTED NOT DETECTED Final   Coronavirus OC43 NOT DETECTED NOT DETECTED Final   Metapneumovirus NOT DETECTED NOT DETECTED Final   Rhinovirus / Enterovirus NOT DETECTED NOT DETECTED Final   Influenza A NOT DETECTED NOT DETECTED Final   Influenza B NOT DETECTED NOT DETECTED Final   Parainfluenza Virus 1 NOT DETECTED NOT DETECTED Final   Parainfluenza Virus 2 NOT DETECTED NOT DETECTED Final   Parainfluenza Virus 3 NOT DETECTED NOT DETECTED Final   Parainfluenza Virus 4 NOT DETECTED NOT DETECTED Final   Respiratory Syncytial Virus NOT DETECTED NOT DETECTED Final   Bordetella pertussis NOT DETECTED NOT DETECTED Final   Bordetella Parapertussis NOT DETECTED NOT DETECTED Final   Chlamydophila pneumoniae NOT DETECTED NOT DETECTED Final   Mycoplasma pneumoniae NOT DETECTED NOT DETECTED Final    Comment: Performed at [facility], [place] [place], [place] [address]         Radiology Studies: No results found.      Scheduled Meds:  arformoterol  15 mcg Nebulization BID   vitamin C  500 mg Oral BID   aspirin  81 mg Oral Daily   atorvastatin  80 mg Oral Daily   budesonide (PULMICORT) nebulizer solution  0.25 mg Nebulization BID   carvedilol  25 mg Oral BID   Chlorhexidine Gluconate Cloth  6 each Topical Q0600   Chlorhexidine Gluconate Cloth  6 each Topical Q0600   epoetin (EPOGEN/PROCRIT) injection  10,000 Units Intravenous Q M,W,F-HD   feeding supplement (NEPRO CARB STEADY)   237 mL Oral TID BM   ferric citrate  420 mg Oral TID WC   gabapentin  400 mg Oral QHS   heparin  5,000 Units Subcutaneous Q8H   hydrALAZINE  50 mg Oral Q8H   insulin aspart  0-5 Units Subcutaneous QHS   insulin aspart  0-9 Units Subcutaneous TID WC   insulin detemir  8 Units Subcutaneous Daily   ipratropium-albuterol  3 mL Nebulization BID   isosorbide dinitrate  10 mg Oral TID   levETIRAcetam  500 mg Oral Daily   mouth rinse  15 mL Mouth Rinse q12n4p   multivitamin  1 tablet Oral QHS   sevelamer carbonate  1,600 mg Oral TID WC   Continuous Infusions:  cefTRIAXone (ROCEPHIN)  IV Stopped (05/21/21 0937)     LOS: 3 days    Time spent: 35 minutes    Max Sane, MD [facility] Pager [phone]  If 7PM-7AM, please contact night-coverage 05/22/2021, 2:57 PM

## 2021-05-22 NOTE — Progress Notes (Signed)
Pre HD RN assessment

## 2021-05-23 DIAGNOSIS — I10 Essential (primary) hypertension: Secondary | ICD-10-CM | POA: Diagnosis not present

## 2021-05-23 DIAGNOSIS — J9601 Acute respiratory failure with hypoxia: Secondary | ICD-10-CM | POA: Diagnosis not present

## 2021-05-23 DIAGNOSIS — D649 Anemia, unspecified: Secondary | ICD-10-CM | POA: Diagnosis not present

## 2021-05-23 DIAGNOSIS — I5022 Chronic systolic (congestive) heart failure: Secondary | ICD-10-CM | POA: Diagnosis not present

## 2021-05-23 DIAGNOSIS — I509 Heart failure, unspecified: Secondary | ICD-10-CM | POA: Diagnosis not present

## 2021-05-23 DIAGNOSIS — N186 End stage renal disease: Secondary | ICD-10-CM | POA: Diagnosis not present

## 2021-05-23 LAB — CBC WITH DIFFERENTIAL/PLATELET
Abs Immature Granulocytes: 0.19 10*3/uL — ABNORMAL HIGH (ref 0.00–0.07)
Basophils Absolute: 0 10*3/uL (ref 0.0–0.1)
Basophils Relative: 0 %
Eosinophils Absolute: 0.2 10*3/uL (ref 0.0–0.5)
Eosinophils Relative: 2 %
HCT: 25 % — ABNORMAL LOW (ref 39.0–52.0)
Hemoglobin: 7.8 g/dL — ABNORMAL LOW (ref 13.0–17.0)
Immature Granulocytes: 2 %
Lymphocytes Relative: 15 %
Lymphs Abs: 1.2 10*3/uL (ref 0.7–4.0)
MCH: 26.5 pg (ref 26.0–34.0)
MCHC: 31.2 g/dL (ref 30.0–36.0)
MCV: 85 fL (ref 80.0–100.0)
Monocytes Absolute: 0.7 10*3/uL (ref 0.1–1.0)
Monocytes Relative: 9 %
Neutro Abs: 5.9 10*3/uL (ref 1.7–7.7)
Neutrophils Relative %: 72 %
Platelets: 133 10*3/uL — ABNORMAL LOW (ref 150–400)
RBC: 2.94 MIL/uL — ABNORMAL LOW (ref 4.22–5.81)
RDW: 18.7 % — ABNORMAL HIGH (ref 11.5–15.5)
WBC: 8.2 10*3/uL (ref 4.0–10.5)
nRBC: 0 % (ref 0.0–0.2)

## 2021-05-23 LAB — CULTURE, BLOOD (ROUTINE X 2)
Culture: NO GROWTH
Culture: NO GROWTH
Special Requests: ADEQUATE
Special Requests: ADEQUATE

## 2021-05-23 LAB — GLUCOSE, CAPILLARY
Glucose-Capillary: 164 mg/dL — ABNORMAL HIGH (ref 70–99)
Glucose-Capillary: 161 mg/dL — ABNORMAL HIGH (ref 70–99)
Glucose-Capillary: 150 mg/dL — ABNORMAL HIGH (ref 70–99)
Glucose-Capillary: 143 mg/dL — ABNORMAL HIGH (ref 70–99)

## 2021-05-23 LAB — BASIC METABOLIC PANEL
Anion gap: 6 (ref 5–15)
BUN: 38 mg/dL — ABNORMAL HIGH (ref 6–20)
CO2: 31 mmol/L (ref 22–32)
Calcium: 8.3 mg/dL — ABNORMAL LOW (ref 8.9–10.3)
Chloride: 98 mmol/L (ref 98–111)
Creatinine, Ser: 4.23 mg/dL — ABNORMAL HIGH (ref 0.61–1.24)
GFR, Estimated: 15 mL/min — ABNORMAL LOW (ref >60)
Glucose, Bld: 176 mg/dL — ABNORMAL HIGH (ref 70–99)
Potassium: 3.8 mmol/L (ref 3.5–5.1)
Sodium: 135 mmol/L (ref 135–145)

## 2021-05-23 MED ORDER — LISINOPRIL 20 MG PO TABS
40.0000 mg | ORAL_TABLET | Freq: Every day | ORAL | Status: DC
  Administered 2021-05-23 – 2021-05-26 (×4): 40 mg via ORAL
  Filled 2021-05-23 (×4): qty 2

## 2021-05-23 NOTE — Progress Notes (Signed)
[facility]  ROUNDING NOTE   Subjective:    Patient seen resting in bed Alert and oriented Tolerating meals, states he ate a good breakfast Denies shortness of breath States they are sending to rehab at discharge   Objective:  Vital signs in last 24 hours:  Temp:  [98.5 F (36.9 C)-99 F (37.2 C)] 98.5 F (36.9 C) (08/06 0719) Pulse Rate:  [76-84] 83 (08/06 0719) Resp:  [15-26] 18 (08/06 0719) BP: (141-186)/(69-87) 186/87 (08/06 0719) SpO2:  [94 %-100 %] 94 % (08/06 0803)  Weight change:  Filed Weights   05/19/21 0414 05/20/21 0500 05/21/21 0500  Weight: 56.4 kg 59 kg 60.7 kg    Intake/Output: I/O last 3 completed shifts: In: 277 [P.O.:277] Out: 1100 [Other:1100]   Intake/Output this shift:  No intake/output data recorded.  Physical Exam: General: NAD, laying in bed  Head: Normocephalic, atraumatic. Moist oral mucosal membranes  Eyes: Anicteric  Lungs:  Clear, room air  Heart: Regular rate and rhythm  Abdomen:  Soft, nontender  Extremities:  Bilateral BKA  Neurologic: Alert, and oriented  Skin: No lesions  Access: LUE AVF    Basic Metabolic Panel: Recent Labs  Lab 05/19/21 0405 05/20/21 0437 05/21/21 0522 05/22/21 0603 05/23/21 0505  NA 137 130* 137 134* 135  K 5.3* 5.6* 4.1 4.6 3.8  CL 97* 94* 96* 95* 98  CO2 26 25 30 28 31   GLUCOSE 200* 329* 190* 164* 176*  BUN 60* 88* 44* 59* 38*  CREATININE 6.88* 7.81* 4.76* 6.08* 4.23*  CALCIUM 8.5* 7.9* 8.2* 8.0* 8.3*  PHOS 6.9*  --   --   --   --      Liver Function Tests: No results for input(s): AST, ALT, ALKPHOS, BILITOT, PROT, ALBUMIN in the last 168 hours.  No results for input(s): LIPASE, AMYLASE in the last 168 hours. No results for input(s): AMMONIA in the last 168 hours.  CBC: Recent Labs  Lab 05/19/21 0405 05/20/21 0437 05/20/21 1420 05/21/21 0522 05/22/21 0603 05/23/21 0505  WBC 6.1 9.2  --  9.9 8.0 8.2  NEUTROABS 5.2 6.9  --  7.8* 5.9 5.9  HGB 7.2* 5.8* 7.6* 8.8*  8.3* 7.8*  HCT 23.5* 19.0* 23.3* 28.1* 26.5* 25.0*  MCV 85.1 84.8  --  85.9 84.9 85.0  PLT 93* 122*  --  178 140* 133*     Cardiac Enzymes: No results for input(s): CKTOTAL, CKMB, CKMBINDEX, TROPONINI in the last 168 hours.  BNP: Invalid input(s): POCBNP  CBG: Recent Labs  Lab 05/21/21 2108 05/22/21 0722 05/22/21 1614 05/22/21 2044 05/23/21 0718  GLUCAP 125* 157* 127* 156* 164*     Microbiology: Results for orders placed or performed during the hospital encounter of 05/18/21  Resp Panel by RT-PCR (Flu A&B, Covid) Nasopharyngeal Swab     Status: None   Collection Time: 05/18/21  1:43 AM   Specimen: Nasopharyngeal Swab; Nasopharyngeal(NP) swabs in vial transport medium  Result Value Ref Range Status   SARS Coronavirus 2 by RT PCR NEGATIVE NEGATIVE Final    Comment: (NOTE) SARS-CoV-2 target nucleic acids are NOT DETECTED.  The SARS-CoV-2 RNA is generally detectable in upper respiratory specimens during the acute phase of infection. The lowest concentration of SARS-CoV-2 viral copies this assay can detect is 138 copies/mL. A negative result does not preclude SARS-Cov-2 infection and should not be used as the sole basis for treatment or other patient management decisions. A negative result may occur with  improper specimen collection/handling, submission of specimen  other than nasopharyngeal swab, presence of viral mutation(s) within the areas targeted by this assay, and inadequate number of viral copies(<138 copies/mL). A negative result must be combined with clinical observations, patient history, and epidemiological information. The expected result is Negative.  Fact Sheet for Patients:  EntrepreneurPulse.com.au  Fact Sheet for Healthcare Providers:  IncredibleEmployment.be  This test is no t yet approved or cleared by the [place] FDA and  has been authorized for detection and/or diagnosis of SARS-CoV-2 by FDA under an  Emergency Use Authorization (EUA). This EUA will remain  in effect (meaning this test can be used) for the duration of the COVID-19 declaration under Section 564(b)(1) of the Act, 21 U.S.C.section 360bbb-3(b)(1), unless the authorization is terminated  or revoked sooner.       Influenza A by PCR NEGATIVE NEGATIVE Final   Influenza B by PCR NEGATIVE NEGATIVE Final    Comment: (NOTE) The Xpert Xpress SARS-CoV-2/FLU/RSV plus assay is intended as an aid in the diagnosis of influenza from Nasopharyngeal swab specimens and should not be used as a sole basis for treatment. Nasal washings and aspirates are unacceptable for Xpert Xpress SARS-CoV-2/FLU/RSV testing.  Fact Sheet for Patients: EntrepreneurPulse.com.au  Fact Sheet for Healthcare Providers: IncredibleEmployment.be  This test is not yet approved or cleared by the [place] FDA and has been authorized for detection and/or diagnosis of SARS-CoV-2 by FDA under an Emergency Use Authorization (EUA). This EUA will remain in effect (meaning this test can be used) for the duration of the COVID-19 declaration under Section 564(b)(1) of the Act, 21 U.S.C. section 360bbb-3(b)(1), unless the authorization is terminated or revoked.  Performed at [facility], [address] [place], [place] [address]   Blood culture (routine x 2)     Status: None   Collection Time: 05/18/21  1:44 AM   Specimen: BLOOD  Result Value Ref Range Status   Specimen Description BLOOD RIGHT ANTECUBITAL  Final   Special Requests   Final    BOTTLES DRAWN AEROBIC AND ANAEROBIC Blood Culture adequate volume   Culture   Final    NO GROWTH 5 DAYS Performed at [facility], [address] [place], [place] [address]    Report Status 05/23/2021 FINAL  Final  Blood culture (routine x 2)     Status: None   Collection Time: 05/18/21  1:44 AM   Specimen: BLOOD  Result Value Ref Range Status   Specimen  Description BLOOD BLOOD RIGHT FOREARM  Final   Special Requests   Final    BOTTLES DRAWN AEROBIC AND ANAEROBIC Blood Culture adequate volume   Culture   Final    NO GROWTH 5 DAYS Performed at [facility], [address] [place], [place] [address]    Report Status 05/23/2021 FINAL  Final  Acid Fast Smear (AFB)     Status: None   Collection Time: 05/18/21 11:25 AM   Specimen: PATH Cytology Pleural fluid  Result Value Ref Range Status   AFB Specimen Processing Concentration  Final   Acid Fast Smear Negative  Final    Comment: (NOTE) Performed At: [facility] [place], [place] [id number] Rush Farmer MD PJ:[id number]    Source (AFB) PLEURAL  Final    Comment: Performed at [facility], [address] [place], [place] [address]  Body fluid culture w Gram Stain     Status: None   Collection Time: 05/18/21 11:25 AM   Specimen: PATH Cytology Pleural fluid  Result Value  Ref Range Status   Specimen Description   Final    PLEURAL Performed at [facility], [address] [place], [place] [address]    Special Requests   Final    NONE Performed at [facility], [address] [place], [place] [address]    Gram Stain   Final    RARE WBC PRESENT, PREDOMINANTLY MONONUCLEAR NO ORGANISMS SEEN    Culture   Final    NO GROWTH 3 DAYS Performed at [facility], [place] [address] [place], [place] [address]    Report Status 05/21/2021 FINAL  Final  Respiratory (~20 pathogens) panel by PCR     Status: None   Collection Time: 05/20/21  7:41 AM   Specimen: Nasopharyngeal Swab; Respiratory  Result Value Ref Range Status   Adenovirus NOT DETECTED NOT DETECTED Final   Coronavirus 229E NOT DETECTED NOT DETECTED Final    Comment: (NOTE) The Coronavirus on the Respiratory Panel, DOES NOT test for the novel  Coronavirus (2019 nCoV)    Coronavirus HKU1 NOT DETECTED NOT DETECTED Final   Coronavirus NL63 NOT DETECTED NOT  DETECTED Final   Coronavirus OC43 NOT DETECTED NOT DETECTED Final   Metapneumovirus NOT DETECTED NOT DETECTED Final   Rhinovirus / Enterovirus NOT DETECTED NOT DETECTED Final   Influenza A NOT DETECTED NOT DETECTED Final   Influenza B NOT DETECTED NOT DETECTED Final   Parainfluenza Virus 1 NOT DETECTED NOT DETECTED Final   Parainfluenza Virus 2 NOT DETECTED NOT DETECTED Final   Parainfluenza Virus 3 NOT DETECTED NOT DETECTED Final   Parainfluenza Virus 4 NOT DETECTED NOT DETECTED Final   Respiratory Syncytial Virus NOT DETECTED NOT DETECTED Final   Bordetella pertussis NOT DETECTED NOT DETECTED Final   Bordetella Parapertussis NOT DETECTED NOT DETECTED Final   Chlamydophila pneumoniae NOT DETECTED NOT DETECTED Final   Mycoplasma pneumoniae NOT DETECTED NOT DETECTED Final    Comment: Performed at [facility], [place]. [address] [place], [place] [address]    Coagulation Studies: No results for input(s): LABPROT, INR in the last 72 hours.  Urinalysis: No results for input(s): COLORURINE, LABSPEC, PHURINE, GLUCOSEU, HGBUR, BILIRUBINUR, KETONESUR, PROTEINUR, UROBILINOGEN, NITRITE, LEUKOCYTESUR in the last 72 hours.  Invalid input(s): APPERANCEUR    Imaging: No results found.   Medications:      arformoterol  15 mcg Nebulization BID   vitamin C  500 mg Oral BID   aspirin  81 mg Oral Daily   atorvastatin  80 mg Oral Daily   budesonide (PULMICORT) nebulizer solution  0.25 mg Nebulization BID   carvedilol  25 mg Oral BID   Chlorhexidine Gluconate Cloth  6 each Topical Q0600   Chlorhexidine Gluconate Cloth  6 each Topical Q0600   epoetin (EPOGEN/PROCRIT) injection  10,000 Units Intravenous Q M,W,F-HD   feeding supplement (NEPRO CARB STEADY)  237 mL Oral TID BM   ferric citrate  420 mg Oral TID WC   gabapentin  400 mg Oral QHS   heparin  5,000 Units Subcutaneous Q8H   hydrALAZINE  50 mg Oral Q6H   insulin aspart  0-5 Units Subcutaneous QHS   insulin aspart  0-9 Units  Subcutaneous TID WC   insulin detemir  8 Units Subcutaneous Daily   ipratropium-albuterol  3 mL Nebulization BID   isosorbide dinitrate  10 mg Oral TID   levETIRAcetam  500 mg Oral Daily   lisinopril  40 mg Oral Daily   mouth rinse  15 mL Mouth Rinse  q12n4p   multivitamin  1 tablet Oral QHS   sevelamer carbonate  1,600 mg Oral TID WC   acetaminophen **OR** acetaminophen  Assessment/ Plan:  [name] is a 60 y.o. black male with end stage renal disease on hemodialysis, diabetes mellitus type II, hypertension, CVA, peripheral vascular disease status post bilateral BKA and ESRD on dialysis. Patient presented to ED with shortness of breath and cough. He has been admitted to [facility] for SOB (shortness of breath) [R06.02] Pleural effusion [J90] Healthcare-associated pneumonia [J18.9] Acute respiratory failure with hypoxia ([place]) [J96.01] Acute on chronic anemia [D64.9] Acute decompensated heart failure ([place]) [I50.9]   [facility]/MWF/LUE AVF  End stage renal disease on dialysis  - Dialysis on MWF, Received dialysis yesterday, Uf 1.1L removed. Tolerated well. Treatment terminated due to need for bathrroom and refusal to use bedpan or urinal. Next treatment scheduled for Monday  2. Acute respiratory failure: requiring oxygen. Status post left thoracentesis on 8/1. 1 Liter removed.  - Supportive care - Stress dose steroids, nebs as scheduled, room air - Appreciate pulmonology recs   3. Anemia of chronic kidney disease : PRBC transfusion yesterday Lab Results  Component Value Date   HGB 7.8 (L) 05/23/2021  EPO 10000 units with HD treatments.    4. Secondary Hyperparathyroidism: with hyperphosphatemia Lab Results  Component Value Date   PTH 283 (H) 01/08/2019   CALCIUM 8.3 (L) 05/23/2021   CAION 1.10 (L) 09/12/2020   PHOS 6.9 (H) 05/19/2021  - Phosphorus and calcium not at goal - Auryxia and sevelamer with meals.   5. Hypertension: Current regimen of clonidine,  amlodipine, carvedilol, and hydralazine.  BP elevated this am    LOS: 4   8/6/20229:46 AM

## 2021-05-23 NOTE — TOC Progression Note (Addendum)
Transition of Care [facility]) - Progression Note    Patient Details  Name: [name] MRN: [id number] Date of Birth: Aug 21, 1961  Transition of Care [facility]) CM/SW [place], LCSW Phone Number: 05/23/2021, 9:08 AM  Clinical Narrative:   Patient has a bed offer at [facility]. Attempted calls to Admissions Worker and main # for [employer]. No answer. Sent message to Admissions Worker requesting call ASAP. Will continue to follow.  1:40- [facility] again, spoke to [place] who stated they may be able to accept patient this weekend, she will need to check his insurance. She asked if he has had the COVID booster. Call to patient's wife. She said patient has had 2 booster vaccines. She reported she would prefer patient not go to [facility], she would like [facility] if possible. CSW asked Tammy and Otila Kluver at [facility] to review pending bed request. Explained to patient's wife that if [facility] cannot take, we would need to move forward with SNF(s) with bed offers and available beds since patient is medically ready, she verbalized understanding.  3:05- Shirlee Limerick with [facility] says they can take patient, asked if they will be able to take him to Dialysis M,W,F in [place] or if this would need to be switched. Also reached out to Tammy at [facility] again, she says she will try to review referral today or tomorrow and let CSW know.  3:43- Per Shirlee Limerick would need to switch to HD Center in [place] in order for patient to go to [facility]. Still waiting on Tammy with [facility] to review patient referral.  Expected Discharge Plan: [place] Barriers to Discharge: Continued Medical Work up  Expected Discharge Plan and Services Expected Discharge Plan: [place]   Discharge Planning Services: CM Consult Post Acute Care Choice: [place], Resumption of Svcs/PTA Provider Living arrangements for the past 2 months: Single Family Home                 DME  Arranged: N/A DME Agency: NA       HH Arranged: RN, PT, OT, Nurse's Aide [place] Agency: [place] ([facility]) Date HH Agency Contacted: 05/18/21 Time [place]: 1452 Representative spoke with at [place]: [place] ([place]) Interventions    Readmission Risk Interventions Readmission Risk Prevention Plan 02/10/2021 02/08/2021 11/21/2020  Transportation Screening Complete Complete Complete  PCP or Specialist Appt within 5-7 Days - Complete -  Home Care Screening - Complete -  Medication Review (RN CM) - Complete -  HRI or Home Care Consult Complete - -  Palliative Care Screening Complete - -  Medication Review (RN Care Manager) Complete - -  Some recent data might be hidden

## 2021-05-23 NOTE — Progress Notes (Signed)
Progress Note  Patient Name: [name] Date of Encounter: 05/23/2021  Primary Cardiologist: New to [facility] - consult by End  Subjective   Transferred out of the ICU. No chest pain or dyspnea. HGB down trending following pRBC transfusion on 8/3. He pulled out an IV last night with noted bleeding. BP elevated this morning, awaiting AM medications. No chest pain, dyspnea, palpitations, dizziness, presyncope, or syncope.    Inpatient Medications    Scheduled Meds:  arformoterol  15 mcg Nebulization BID   vitamin C  500 mg Oral BID   aspirin  81 mg Oral Daily   atorvastatin  80 mg Oral Daily   budesonide (PULMICORT) nebulizer solution  0.25 mg Nebulization BID   carvedilol  25 mg Oral BID   Chlorhexidine Gluconate Cloth  6 each Topical Q0600   Chlorhexidine Gluconate Cloth  6 each Topical Q0600   epoetin (EPOGEN/PROCRIT) injection  10,000 Units Intravenous Q M,W,F-HD   feeding supplement (NEPRO CARB STEADY)  237 mL Oral TID BM   ferric citrate  420 mg Oral TID WC   gabapentin  400 mg Oral QHS   heparin  5,000 Units Subcutaneous Q8H   hydrALAZINE  50 mg Oral Q6H   insulin aspart  0-5 Units Subcutaneous QHS   insulin aspart  0-9 Units Subcutaneous TID WC   insulin detemir  8 Units Subcutaneous Daily   ipratropium-albuterol  3 mL Nebulization BID   isosorbide dinitrate  10 mg Oral TID   levETIRAcetam  500 mg Oral Daily   lisinopril  40 mg Oral Daily   mouth rinse  15 mL Mouth Rinse q12n4p   multivitamin  1 tablet Oral QHS   sevelamer carbonate  1,600 mg Oral TID WC   Continuous Infusions:   PRN Meds: acetaminophen **OR** acetaminophen   Vital Signs    Vitals:   05/22/21 2341 05/23/21 0517 05/23/21 0719 05/23/21 0803  BP: (!) 148/70 (!) 150/70 (!) 186/87   Pulse: 79 78 83   Resp: 16 16 18    Temp: 98.8 F (37.1 C) 98.7 F (37.1 C) 98.5 F (36.9 C)   TempSrc:   Oral   SpO2:  96% 94% 94%  Weight:      Height:        Intake/Output Summary (Last 24 hours) at  05/23/2021 0943 Last data filed at 05/22/2021 2200 Gross per 24 hour  Intake 277 ml  Output 1100 ml  Net -823 ml    Filed Weights   05/19/21 0414 05/20/21 0500 05/21/21 0500  Weight: 56.4 kg 59 kg 60.7 kg    Telemetry    Not on telemetry - Personally Reviewed  ECG    No new tracings - Personally Reviewed  Physical Exam   GEN: No acute distress.  Cachectic appearing. Neck: No JVD. Cardiac: RRR, I/VI systolic murmur LLSB, no rubs, or gallops.  Respiratory: Clear to auscultation bilaterally.  GI: Soft, nontender, non-distended.   MS: No stump edema status post bilateral BKA. Neuro:  Alert and oriented x 3; Nonfocal.  Psych: Normal affect.  Labs    Chemistry Recent Labs  Lab 05/21/21 0522 05/22/21 0603 05/23/21 0505  NA 137 134* 135  K 4.1 4.6 3.8  CL 96* 95* 98  CO2 30 28 31   GLUCOSE 190* 164* 176*  BUN 44* 59* 38*  CREATININE 4.76* 6.08* 4.23*  CALCIUM 8.2* 8.0* 8.3*  GFRNONAA 13* 10* 15*  ANIONGAP 11 11 6       Hematology Recent Labs  Lab 05/21/21 0522 05/22/21 0603 05/23/21 0505  WBC 9.9 8.0 8.2  RBC 3.27* 3.12* 2.94*  HGB 8.8* 8.3* 7.8*  HCT 28.1* 26.5* 25.0*  MCV 85.9 84.9 85.0  MCH 26.9 26.6 26.5  MCHC 31.3 31.3 31.2  RDW 19.1* 18.9* 18.7*  PLT 178 140* 133*     Cardiac EnzymesNo results for input(s): TROPONINI in the last 168 hours. No results for input(s): TROPIPOC in the last 168 hours.   BNP Recent Labs  Lab 05/18/21 0143  BNP >4,500.0*      DDimer  Recent Labs  Lab 05/19/21 1346  DDIMER 1.22*      Radiology    CT Angio Chest Pulmonary Embolism (PE) W or WO Contrast  Result Date: 05/20/2021 IMPRESSION: No evidence of pulmonary embolus. Cardiomegaly, coronary artery disease. Small bilateral effusions. Bilateral lower lobe airspace opacities with some improvement on the left since prior study. Findings could reflect edema or infection. Aortic Atherosclerosis (ICD10-I70.0). Electronically Signed   By: Rolm Baptise M.D.   On:  05/20/2021 08:37    Cardiac Studies   2D echo 04/2021: 1. Left ventricular ejection fraction, by estimation, is 30 to 35%. The  left ventricle has moderately decreased function. The left ventricle  demonstrates global hypokinesis. The left ventricular internal cavity size  was mildly dilated. There is mild  left ventricular hypertrophy. Left ventricular diastolic parameters are  consistent with Grade II diastolic dysfunction (pseudonormalization).  Elevated left atrial pressure.   2. Right ventricular systolic function is moderately reduced. The right  ventricular size is moderately enlarged. There is normal pulmonary artery  systolic pressure.   3. Left atrial size was severely dilated.   4. Large pleural effusion in the left lateral region.   5. The mitral valve is abnormal. Mild to moderate mitral valve  regurgitation. No evidence of mitral stenosis. Moderate mitral annular  calcification.   6. Tricuspid valve regurgitation is moderate.   7. The aortic valve is tricuspid. There is moderate calcification of the  aortic valve. There is moderate thickening of the aortic valve. Aortic  valve regurgitation is not visualized. Mild to moderate aortic valve  sclerosis/calcification is present,  without any evidence of aortic stenosis.   8. The inferior vena cava is normal in size with <50% respiratory  variability, suggesting right atrial pressure of 8 mmHg.  __________  2D echo 08/2020: 1. Left ventricular ejection fraction, by estimation, is 40 to 45%. The  left ventricle has mildly decreased function. The left ventricle  demonstrates global hypokinesis. There is moderate concentric left  ventricular hypertrophy. Left ventricular  diastolic parameters are consistent with Grade II diastolic dysfunction  (pseudonormalization). Elevated left atrial pressure.   2. Right ventricular systolic function is mildly reduced. The right  ventricular size is normal. Tricuspid regurgitation signal  is inadequate  for assessing PA pressure.   3. Left atrial size was mildly dilated.   4. Right atrial size was mildly dilated.   5. The mitral valve is normal in structure. Trivial mitral valve  regurgitation.   6. The aortic valve is tricuspid. There is mild calcification of the  aortic valve. There is mild thickening of the aortic valve. Aortic valve  regurgitation is not visualized. Mild aortic valve sclerosis is present,  with no evidence of aortic valve  stenosis.   7. Aortic dilatation noted. There is mild dilatation of the ascending  aorta, measuring 40 mm.   8. The inferior vena cava is normal in size with <50% respiratory  variability,  suggesting right atrial pressure of 8 mmHg.   Patient Profile     60 y.o. male with history of ESRD on HD M WF, ICA, stroke, DM2, seizure disorder, PAD status post bilateral BKA, DVT, HTN, HLD, anemia of chronic disease, and marijuana use who we are seeing for acute on chronic HFrEF.  Assessment & Plan    1.  Acute on chronic HFrEF with left-sided pleural effusion status post thoracentesis on 8/1 with 1 L of fluid removed: -Echo this admission shows a worsening cardiomyopathy with an EF of 35% with prior echo in 08/2020 demonstrating an EF of 45% -Etiology of his cardiomyopathy remains uncertain, though there is high suspicion for ischemic substrate given multiple comorbid conditions -Volume management per hemodialysis -Continue current GDMT including carvedilol, hydralazine/isosorbide and lisinopril  -Clonidine stopped 8/4 given soft BP, and to allow for titration of GDMT as tolerated  -Not currently on MRA/ARNI/SGLT2i given relative hypotension associated with ESRD status -Currently unable to pursue [facility] given transfusion dependent severe anemia, this can be revisited in the outpatient setting -With regards to his pleural effusion, cytology was lymphocyte predominant with further evaluation deferred to internal medicine  2.  Anemia of  chronic disease: -Presenting hemoglobin of 5.8, improved to 8.3 status post PRBC on 8/3, now down trending -Pulled out IV last night -Maintain Hgb greater than 8  3.  ESRD on HD: -Volume management per hemodialysis  4.  HTN: -BP briefly soft on 8/4 leading to the holding of clonidine -Elevated this morning, awaiting AM medications -Continue current medical therapy as outlined above, titrate as needed  5.  Cachexia: -Reports poor appetite  For questions or updates, please contact [place] Please consult [email or web address] for contact info under Cardiology/STEMI.    Signed, Christell Faith, PA-C [place] Pager: [phone] 05/23/2021, 9:43 AM

## 2021-05-23 NOTE — Plan of Care (Signed)
Pt alert and oriented x 3. Disoriented to time. Pt educated on call light. Pt will use call light but at times will yell out repeatedly. Pt has removed 2 IV this shift thus far. Pt calls out to nursing station stating "I'm bleeding" pt informed he didn't pull them out. Dressings completely removed. With catheters pulled out. New IV placed, with complete bed change. IV covered with kerlix and sock for protection. Pt informed of risk of pulling out devices and needed for IV related to his illness. CHG completed with bed change.  Problem: Health Behavior/Discharge Planning: Goal: Ability to manage health-related needs will improve Outcome: Not Progressing   Problem: Activity: Goal: Risk for activity intolerance will decrease Outcome: Not Progressing  Put has been unable to tolerate mobility but did sit 90degrees in bed  Problem: Skin Integrity: Goal: Risk for impaired skin integrity will decrease Outcome: Not Progressing  Pt continues to have wounds and movies body off of pillow placed to alleviate pressure on sacrum.  Problem: Clinical Measurements: Goal: Ability to maintain clinical measurements within normal limits will improve Outcome: Progressing Goal: Will remain free from infection Outcome: Progressing   Problem: Nutrition: Goal: Adequate nutrition will be maintained Outcome: Progressing   Problem: Elimination: Goal: Will not experience complications related to bowel motility Outcome: Progressing

## 2021-05-23 NOTE — Progress Notes (Signed)
PROGRESS NOTE    [name]  TDV:[id number] DOB: [birth year] DOA: 05/18/2021 PCP: [name], MD    Brief Narrative:   60 year old male history of ESRD on hemodialysis MWF, hypertension, seizure disorder, anemia, systolic chronic heart failure, hidradenitis on [name], bilateral BKA who presents to the ED for evaluation of worsening shortness of breath.  Patient was recently admitted and discharged on 7/29 for similar complaints.  Now presents with persistent fluid overload, possible pneumonia associated with parapneumonic effusion.  Patient is chronically ill and majority of history is gained by speaking with patient's wife.  Patient's wife states that shortness of breath progressively worsened after discharge.  Patient has no associated chest pain, productive cough, fever, chills.  Initially required 10 L oxygen to maintain saturation greater than 90.  Hemoglobin also dropped to 6.8 from baseline approximate.ly 8.  Started on antibiotics for pneumonia, thoracentesis ordered, nephrology consulted.  8/1: Received page from ICU charge RN.  Patient had rapid response called while in hemodialysis.  I went and evaluated the patient at bedside.  Found him to be on a nonrebreather with saturations in the low to mid 80s.  Work of breathing not especially increased.  Lung sounds very coarse and rhonchorous.  Unable to wean off nonrebreather.  Of note patient did get a thoracentesis with 1 L fluid removed today.  No fluid removal was attempted with hemodialysis.  Patient only completed about 30 minutes of his dialysis session.   Will be transferred to stepdown unit for closer monitoring.  BiPAP initiated.  COPD treatment started.  Case discussed at length with ICU attending.  Consultation greatly appreciated  8/2: Respiratory status much improved.  Patient weaned off BiPAP.  Saturating well on 2 L nasal cannula.  Stable for transfer to medical floor.  Cardiology consult requested and appreciated. 8/3:  Was not looking too good and family was debating for comfort care.  Hemoglobin dropped to 5.8 requiring 1 unit of packed RBC transfusion 8/4 patient much more alert, hemoglobin stabilized and eating.  Family not interested in hospice anymore and wants to continue dialysis 8/5: Moved out on the floor 8/6: Waiting for SNF bed   Assessment & Plan:   Principal Problem:   Acute respiratory failure with hypoxia ([place]) Active Problems:   Hypertension   Anemia due to chronic kidney disease   Crohn disease ([place])   Hidradenitis suppurativa   Type 2 diabetes mellitus with kidney complication, with long-term current use of insulin (H[facility])   End stage renal failure on dialysis ([place])   History of CVA (cerebrovascular accident)   Seizure disorder ([place])   S/P bilateral BKA (below knee amputation) ([place])   Acute on chronic anemia   Chronic systolic CHF (congestive heart failure) ([place])   Long term current use of immunosuppressive drug   Acute decompensated heart failure (HCC)   Unstageable pressure ulcer of sacral region ([place])  Acute respiratory failure with hypoxia -present on admission and now resolved RRT called during dialysis on 8/1 for worsening hypoxia requiring transfer to stepdown for BiPAP  S/p thoracentesis on 8/1 with removal of 1 L of fluid.  Transudative Weaned off BiPAP as of 8/2, now weaned off nasal cannula oxygen and remains on room air. CT chest on 8/3 negative for PE  Acute on chronic anemia of chronic kidney disease Baseline hemoglobin in the eights Presented with hemoglobin 6.8-> 5.8-> 8.3> 7.8 Status post 1 unit PRBC on 8/3 with dialysis  Community-acquired pneumonia Completed course of antibiotic.  Treated  Acute on chronic decompensated systolic and diastolic congestive heart failure Echo showing EF of 30 to 35%.  Ischemic work-up not planned by cardiology due to severe anemia.  Medical management recommended. clonidine stopped.   -Continue Coreg, hydralazine, isosorbide,  lisinopril for now.    Essential hypertension Blood pressure trending up, clonidine stopped -Continue Coreg, isosorbide, hydralazine. Adding lisinopril 40 mg once daily on 8/6 for better blood pressure control   End-stage renal disease on hemodialysis HD per nephro.    Hyperlipidemia Continue statin  Type 2 diabetes mellitus Continue sliding scale.  Continue insulin Levemir 8 units subcu daily  Seizure disorder Continue Keppra  History of hidradenitis  [name] on hold  Peripheral arterial disease status post bilateral BKA No acute issues  Severe Protein-calorie malnutrition As evidenced by muscle wasting Body mass index is 18.66 kg/m.   Goals of care Overall poor prognosis.   PT and OT does recommend SNF.  TOC aware and working on placement.   DVT prophylaxis: SQ heparin Code Status: Full code Family Communication: Spouse updated at bedside on 8/5 Disposition Plan: Status is: Inpatient  Remains inpatient appropriate because:Inpatient level of care appropriate due to severity of illness  Dispo: The patient is from: Home              Anticipated d/c is to: SNF once bed available.  Likely [facility]              Patient currently is medically stable to d/c.   Difficult to place patient No   Level of care: Med-Surg  Consultants:  Pulmonary Cardiology Nephrology  Procedures:  Thoracentesis 8/1  Antimicrobials:  Completed course of Rocephin and azithromycin   Subjective: No new complaints.  Blood pressure staying high  Objective: Vitals:   05/22/21 2341 05/23/21 0517 05/23/21 0719 05/23/21 0803  BP: (!) 148/70 (!) 150/70 (!) 186/87   Pulse: 79 78 83   Resp: 16 16 18    Temp: 98.8 F (37.1 C) 98.7 F (37.1 C) 98.5 F (36.9 C)   TempSrc:   Oral   SpO2:  96% 94% 94%  Weight:      Height:        Intake/Output Summary (Last 24 hours) at 05/23/2021 1057 Last data filed at 05/22/2021 2200 Gross per 24 hour  Intake 277 ml  Output 1100 ml  Net -823  ml   Filed Weights   05/19/21 0414 05/20/21 0500 05/21/21 0500  Weight: 56.4 kg 59 kg 60.7 kg    Examination:  General exam: No acute distress.  Appears frail  Respiratory system: Bilateral coarse crackles.  Normal work of breathing.  2 L Cardiovascular system: S1-S2, regular rate and rhythm, 2/6 systolic murmur  gastrointestinal system: Thin, nontender, nondistended, normal bowel sounds Central nervous system: Alert and oriented.  No focal deficits Extremities: Bilateral BKA Skin: Scattered excoriations.  No ulcers or induration Psychiatry: Judgement and insight appear normal. Mood & affect appropriate.     Data Reviewed: I have personally reviewed following labs and imaging studies  CBC: Recent Labs  Lab 05/19/21 0405 05/20/21 0437 05/20/21 1420 05/21/21 0522 05/22/21 0603 05/23/21 0505  WBC 6.1 9.2  --  9.9 8.0 8.2  NEUTROABS 5.2 6.9  --  7.8* 5.9 5.9  HGB 7.2* 5.8* 7.6* 8.8* 8.3* 7.8*  HCT 23.5* 19.0* 23.3* 28.1* 26.5* 25.0*  MCV 85.1 84.8  --  85.9 84.9 85.0  PLT 93* 122*  --  178 140* 540*   Basic Metabolic Panel: Recent Labs  Lab 05/19/21 0405 05/20/21 0437 05/21/21 0522 05/22/21 0603 05/23/21 0505  NA 137 130* 137 134* 135  K 5.3* 5.6* 4.1 4.6 3.8  CL 97* 94* 96* 95* 98  CO2 26 25 30 28 31   GLUCOSE 200* 329* 190* 164* 176*  BUN 60* 88* 44* 59* 38*  CREATININE 6.88* 7.81* 4.76* 6.08* 4.23*  CALCIUM 8.5* 7.9* 8.2* 8.0* 8.3*  PHOS 6.9*  --   --   --   --    GFR: Estimated Creatinine Clearance: 16.3 mL/min (A) (by C-G formula based on SCr of 4.23 mg/dL (H)). Liver Function Tests: No results for input(s): AST, ALT, ALKPHOS, BILITOT, PROT, ALBUMIN in the last 168 hours. No results for input(s): LIPASE, AMYLASE in the last 168 hours. No results for input(s): AMMONIA in the last 168 hours. Coagulation Profile: No results for input(s): INR, PROTIME in the last 168 hours. Cardiac Enzymes: No results for input(s): CKTOTAL, CKMB, CKMBINDEX, TROPONINI in  the last 168 hours. BNP (last 3 results) No results for input(s): PROBNP in the last 8760 hours. HbA1C: No results for input(s): HGBA1C in the last 72 hours.  CBG: Recent Labs  Lab 05/21/21 2108 05/22/21 0722 05/22/21 1614 05/22/21 2044 05/23/21 0718  GLUCAP 125* 157* 127* 156* 164*   Lipid Profile: No results for input(s): CHOL, HDL, LDLCALC, TRIG, CHOLHDL, LDLDIRECT in the last 72 hours. Thyroid Function Tests: No results for input(s): TSH, T4TOTAL, FREET4, T3FREE, THYROIDAB in the last 72 hours. Anemia Panel: No results for input(s): VITAMINB12, FOLATE, FERRITIN, TIBC, IRON, RETICCTPCT in the last 72 hours. Sepsis Labs: Recent Labs  Lab 05/18/21 0143  PROCALCITON 1.09    Recent Results (from the past 240 hour(s))  Resp Panel by RT-PCR (Flu A&B, Covid) Nasopharyngeal Swab     Status: None   Collection Time: 05/18/21  1:43 AM   Specimen: Nasopharyngeal Swab; Nasopharyngeal(NP) swabs in vial transport medium  Result Value Ref Range Status   SARS Coronavirus 2 by RT PCR NEGATIVE NEGATIVE Final    Comment: (NOTE) SARS-CoV-2 target nucleic acids are NOT DETECTED.  The SARS-CoV-2 RNA is generally detectable in upper respiratory specimens during the acute phase of infection. The lowest concentration of SARS-CoV-2 viral copies this assay can detect is 138 copies/mL. A negative result does not preclude SARS-Cov-2 infection and should not be used as the sole basis for treatment or other patient management decisions. A negative result may occur with  improper specimen collection/handling, submission of specimen other than nasopharyngeal swab, presence of viral mutation(s) within the areas targeted by this assay, and inadequate number of viral copies(<138 copies/mL). A negative result must be combined with clinical observations, patient history, and epidemiological information. The expected result is Negative.  Fact Sheet for Patients:   EntrepreneurPulse.com.au  Fact Sheet for Healthcare Providers:  IncredibleEmployment.be  This test is no t yet approved or cleared by the [place] FDA and  has been authorized for detection and/or diagnosis of SARS-CoV-2 by FDA under an Emergency Use Authorization (EUA). This EUA will remain  in effect (meaning this test can be used) for the duration of the COVID-19 declaration under Section 564(b)(1) of the Act, 21 U.S.C.section 360bbb-3(b)(1), unless the authorization is terminated  or revoked sooner.       Influenza A by PCR NEGATIVE NEGATIVE Final   Influenza B by PCR NEGATIVE NEGATIVE Final    Comment: (NOTE) The Xpert Xpress SARS-CoV-2/FLU/RSV plus assay is intended as an aid in the diagnosis of influenza from Nasopharyngeal swab specimens and  should not be used as a sole basis for treatment. Nasal washings and aspirates are unacceptable for Xpert Xpress SARS-CoV-2/FLU/RSV testing.  Fact Sheet for Patients: EntrepreneurPulse.com.au  Fact Sheet for Healthcare Providers: IncredibleEmployment.be  This test is not yet approved or cleared by the [place] FDA and has been authorized for detection and/or diagnosis of SARS-CoV-2 by FDA under an Emergency Use Authorization (EUA). This EUA will remain in effect (meaning this test can be used) for the duration of the COVID-19 declaration under Section 564(b)(1) of the Act, 21 U.S.C. section 360bbb-3(b)(1), unless the authorization is terminated or revoked.  Performed at [facility], [address] [place], [place] [address]   Blood culture (routine x 2)     Status: None   Collection Time: 05/18/21  1:44 AM   Specimen: BLOOD  Result Value Ref Range Status   Specimen Description BLOOD RIGHT ANTECUBITAL  Final   Special Requests   Final    BOTTLES DRAWN AEROBIC AND ANAEROBIC Blood Culture adequate volume   Culture   Final    NO  GROWTH 5 DAYS Performed at [facility], [address], [place], [place] [address]    Report Status 05/23/2021 FINAL  Final  Blood culture (routine x 2)     Status: None   Collection Time: 05/18/21  1:44 AM   Specimen: BLOOD  Result Value Ref Range Status   Specimen Description BLOOD BLOOD RIGHT FOREARM  Final   Special Requests   Final    BOTTLES DRAWN AEROBIC AND ANAEROBIC Blood Culture adequate volume   Culture   Final    NO GROWTH 5 DAYS Performed at [facility], [address] [place], [place] [address]    Report Status 05/23/2021 FINAL  Final  Acid Fast Smear (AFB)     Status: None   Collection Time: 05/18/21 11:25 AM   Specimen: PATH Cytology Pleural fluid  Result Value Ref Range Status   AFB Specimen Processing Concentration  Final   Acid Fast Smear Negative  Final    Comment: (NOTE) Performed At: [facility] [place], [place] [id number] Rush Farmer MD PI:[id number]    Source (AFB) PLEURAL  Final    Comment: Performed at [facility], [address] [place], [place] [address]  Body fluid culture w Gram Stain     Status: None   Collection Time: 05/18/21 11:25 AM   Specimen: PATH Cytology Pleural fluid  Result Value Ref Range Status   Specimen Description   Final    PLEURAL Performed at [facility], [address], [place], [place] [address]    Special Requests   Final    NONE Performed at [facility], [address] [place], [place] [address]    Gram Stain   Final    RARE WBC PRESENT, PREDOMINANTLY MONONUCLEAR NO ORGANISMS SEEN    Culture   Final    NO GROWTH 3 DAYS Performed at [facility], [place] [address] [place], [place] [address]    Report Status 05/21/2021 FINAL  Final  Respiratory (~20 pathogens) panel by PCR     Status: None   Collection Time: 05/20/21  7:41 AM   Specimen: Nasopharyngeal Swab; Respiratory  Result Value Ref Range Status   Adenovirus NOT DETECTED  NOT DETECTED Final   Coronavirus 229E NOT DETECTED NOT DETECTED Final    Comment: (NOTE) The Coronavirus on the Respiratory Panel, DOES NOT test for the novel  Coronavirus (2019 nCoV)  Coronavirus HKU1 NOT DETECTED NOT DETECTED Final   Coronavirus NL63 NOT DETECTED NOT DETECTED Final   Coronavirus OC43 NOT DETECTED NOT DETECTED Final   Metapneumovirus NOT DETECTED NOT DETECTED Final   Rhinovirus / Enterovirus NOT DETECTED NOT DETECTED Final   Influenza A NOT DETECTED NOT DETECTED Final   Influenza B NOT DETECTED NOT DETECTED Final   Parainfluenza Virus 1 NOT DETECTED NOT DETECTED Final   Parainfluenza Virus 2 NOT DETECTED NOT DETECTED Final   Parainfluenza Virus 3 NOT DETECTED NOT DETECTED Final   Parainfluenza Virus 4 NOT DETECTED NOT DETECTED Final   Respiratory Syncytial Virus NOT DETECTED NOT DETECTED Final   Bordetella pertussis NOT DETECTED NOT DETECTED Final   Bordetella Parapertussis NOT DETECTED NOT DETECTED Final   Chlamydophila pneumoniae NOT DETECTED NOT DETECTED Final   Mycoplasma pneumoniae NOT DETECTED NOT DETECTED Final    Comment: Performed at [facility], [place] [address] [place], [place] [address]      Scheduled Meds:  arformoterol  15 mcg Nebulization BID   vitamin C  500 mg Oral BID   aspirin  81 mg Oral Daily   atorvastatin  80 mg Oral Daily   budesonide (PULMICORT) nebulizer solution  0.25 mg Nebulization BID   carvedilol  25 mg Oral BID   Chlorhexidine Gluconate Cloth  6 each Topical Q0600   Chlorhexidine Gluconate Cloth  6 each Topical Q0600   epoetin (EPOGEN/PROCRIT) injection  10,000 Units Intravenous Q M,W,F-HD   feeding supplement (NEPRO CARB STEADY)  237 mL Oral TID BM   ferric citrate  420 mg Oral TID WC   gabapentin  400 mg Oral QHS   heparin  5,000 Units Subcutaneous Q8H   hydrALAZINE  50 mg Oral Q6H   insulin aspart  0-5 Units Subcutaneous QHS   insulin aspart  0-9 Units Subcutaneous TID WC   insulin detemir  8 Units Subcutaneous  Daily   ipratropium-albuterol  3 mL Nebulization BID   isosorbide dinitrate  10 mg Oral TID   levETIRAcetam  500 mg Oral Daily   lisinopril  40 mg Oral Daily   mouth rinse  15 mL Mouth Rinse q12n4p   multivitamin  1 tablet Oral QHS   sevelamer carbonate  1,600 mg Oral TID WC   Continuous Infusions:     LOS: 4 days    Time spent: 35 minutes    Max Sane, MD [facility] Pager [phone]  If 7PM-7AM, please contact night-coverage 05/23/2021, 10:57 AM

## 2021-05-24 DIAGNOSIS — N186 End stage renal disease: Secondary | ICD-10-CM | POA: Diagnosis not present

## 2021-05-24 DIAGNOSIS — I10 Essential (primary) hypertension: Secondary | ICD-10-CM | POA: Diagnosis not present

## 2021-05-24 DIAGNOSIS — I509 Heart failure, unspecified: Secondary | ICD-10-CM | POA: Diagnosis not present

## 2021-05-24 DIAGNOSIS — D649 Anemia, unspecified: Secondary | ICD-10-CM | POA: Diagnosis not present

## 2021-05-24 DIAGNOSIS — J9601 Acute respiratory failure with hypoxia: Secondary | ICD-10-CM | POA: Diagnosis not present

## 2021-05-24 LAB — CBC WITH DIFFERENTIAL/PLATELET
Abs Immature Granulocytes: 0.15 10*3/uL — ABNORMAL HIGH (ref 0.00–0.07)
Basophils Absolute: 0 10*3/uL (ref 0.0–0.1)
Basophils Relative: 0 %
Eosinophils Absolute: 0.2 10*3/uL (ref 0.0–0.5)
Eosinophils Relative: 3 %
HCT: 24.2 % — ABNORMAL LOW (ref 39.0–52.0)
Hemoglobin: 7.5 g/dL — ABNORMAL LOW (ref 13.0–17.0)
Immature Granulocytes: 2 %
Lymphocytes Relative: 15 %
Lymphs Abs: 1.1 10*3/uL (ref 0.7–4.0)
MCH: 26.9 pg (ref 26.0–34.0)
MCHC: 31 g/dL (ref 30.0–36.0)
MCV: 86.7 fL (ref 80.0–100.0)
Monocytes Absolute: 0.7 10*3/uL (ref 0.1–1.0)
Monocytes Relative: 10 %
Neutro Abs: 5.3 10*3/uL (ref 1.7–7.7)
Neutrophils Relative %: 70 %
Platelets: 141 10*3/uL — ABNORMAL LOW (ref 150–400)
RBC: 2.79 MIL/uL — ABNORMAL LOW (ref 4.22–5.81)
RDW: 18.8 % — ABNORMAL HIGH (ref 11.5–15.5)
WBC: 7.5 10*3/uL (ref 4.0–10.5)
nRBC: 0 % (ref 0.0–0.2)

## 2021-05-24 LAB — GLUCOSE, CAPILLARY
Glucose-Capillary: 146 mg/dL — ABNORMAL HIGH (ref 70–99)
Glucose-Capillary: 237 mg/dL — ABNORMAL HIGH (ref 70–99)
Glucose-Capillary: 170 mg/dL — ABNORMAL HIGH (ref 70–99)
Glucose-Capillary: 109 mg/dL — ABNORMAL HIGH (ref 70–99)

## 2021-05-24 MED ORDER — HYDRALAZINE HCL 50 MG PO TABS
100.0000 mg | ORAL_TABLET | Freq: Three times a day (TID) | ORAL | Status: DC
  Administered 2021-05-24 – 2021-05-26 (×7): 100 mg via ORAL
  Filled 2021-05-24 (×7): qty 2

## 2021-05-24 NOTE — Progress Notes (Signed)
PROGRESS NOTE    [name]  QQP:[id number] DOB: [birth year] DOA: 05/18/2021 PCP: [name], MD    Brief Narrative:   60 year old male history of ESRD on hemodialysis MWF, hypertension, seizure disorder, anemia, systolic chronic heart failure, hidradenitis on [name], bilateral BKA who presents to the ED for evaluation of worsening shortness of breath.  Patient was recently admitted and discharged on 7/29 for similar complaints.  Now presents with persistent fluid overload, possible pneumonia associated with parapneumonic effusion.  Patient is chronically ill and majority of history is gained by speaking with patient's wife.  Patient's wife states that shortness of breath progressively worsened after discharge.  Patient has no associated chest pain, productive cough, fever, chills.  Initially required 10 L oxygen to maintain saturation greater than 90.  Hemoglobin also dropped to 6.8 from baseline approximate.ly 8.  Started on antibiotics for pneumonia, thoracentesis ordered, nephrology consulted.  8/1: Received page from ICU charge RN.  Patient had rapid response called while in hemodialysis.  I went and evaluated the patient at bedside.  Found him to be on a nonrebreather with saturations in the low to mid 80s.  Work of breathing not especially increased.  Lung sounds very coarse and rhonchorous.  Unable to wean off nonrebreather.  Of note patient did get a thoracentesis with 1 L fluid removed today.  No fluid removal was attempted with hemodialysis.  Patient only completed about 30 minutes of his dialysis session.   Will be transferred to stepdown unit for closer monitoring.  BiPAP initiated.  COPD treatment started.  Case discussed at length with ICU attending.  Consultation greatly appreciated  8/2: Respiratory status much improved.  Patient weaned off BiPAP.  Saturating well on 2 L nasal cannula.  Stable for transfer to medical floor.  Cardiology consult requested and appreciated. 8/3:  Was not looking too good and family was debating for comfort care.  Hemoglobin dropped to 5.8 requiring 1 unit of packed RBC transfusion 8/4 patient much more alert, hemoglobin stabilized and eating.  Family not interested in hospice anymore and wants to continue dialysis 8/5: Moved out on the floor 8/6: Waiting for SNF bed   Assessment & Plan:   Principal Problem:   Acute respiratory failure with hypoxia ([place]) Active Problems:   Hypertension   Anemia due to chronic kidney disease   Crohn disease ([place])   Hidradenitis suppurativa   Type 2 diabetes mellitus with kidney complication, with long-term current use of insulin (HCC)   End stage renal failure on dialysis ([place])   History of CVA (cerebrovascular accident)   Seizure disorder ([place])   S/P bilateral BKA (below knee amputation) ([place])   Acute on chronic anemia   Chronic systolic CHF (congestive heart failure) ([place])   Long term current use of immunosuppressive drug   Acute decompensated heart failure (HCC)   Unstageable pressure ulcer of sacral region ([place])  Acute respiratory failure with hypoxia -present on admission and now resolved RRT called during dialysis on 8/1 for worsening hypoxia requiring transfer to stepdown for BiPAP  S/p thoracentesis on 8/1 with removal of 1 L of fluid.  Transudative Weaned off BiPAP as of 8/2, now weaned off nasal cannula oxygen and remains on room air. CT chest on 8/3 negative for PE  Acute on chronic anemia of chronic kidney disease Baseline hemoglobin in the eights Presented with hemoglobin 6.8-> 5.8-> 8.3> 7.8> 7.5 Status post 1 unit PRBC on 8/3 with dialysis.  transfuse if hemoglobin less than 7  Community-acquired  pneumonia Completed course of antibiotic.  Treated  Acute on chronic decompensated systolic and diastolic congestive heart failure Echo showing EF of 30 to 35%.  Ischemic work-up not planned by cardiology due to severe anemia.  Medical management recommended. clonidine stopped.    -Continue Coreg, hydralazine, isosorbide, lisinopril for now.    Essential hypertension Blood pressure trending up, clonidine stopped -Continue Coreg, isosorbide, hydralazine and lisinopril   End-stage renal disease on hemodialysis HD per nephro.    Hyperlipidemia Continue statin  Type 2 diabetes mellitus Continue sliding scale.  Continue insulin Levemir 8 units subcu daily  Seizure disorder Continue Keppra  History of hidradenitis  [name] on hold  Peripheral arterial disease status post bilateral BKA No acute issues  Severe Protein-calorie malnutrition As evidenced by muscle wasting Body mass index is 18.23 kg/m.   Goals of care Overall poor prognosis.   PT and OT does recommend SNF.  TOC aware and working on placement.  Waiting to hear response from [facility] per TOC   DVT prophylaxis: SQ heparin Code Status: Full code Family Communication: Spouse updated over phone on 8/7 Disposition Plan: Status is: Inpatient  Remains inpatient appropriate because:Inpatient level of care appropriate due to severity of illness  Dispo: The patient is from: Home              Anticipated d/c is to: SNF once bed available.  Likely [facility] versus [facility]              Patient currently is medically stable to d/c.   Difficult to place patient No   Level of care: Med-Surg  Consultants:  Pulmonary Cardiology Nephrology  Procedures:  Thoracentesis 8/1  Antimicrobials:  Completed course of Rocephin and azithromycin   Subjective: No new complaints.  Objective: Vitals:   05/24/21 0516 05/24/21 0738 05/24/21 0755 05/24/21 1120  BP: (!) 158/71  (!) 152/66 140/70  Pulse: 73  73 75  Resp: 16  18 20   Temp: 98.5 F (36.9 C)  98.4 F (36.9 C) 98.7 F (37.1 C)  TempSrc:    Oral  SpO2: 100% 98% 100% 98%  Weight:      Height:        Intake/Output Summary (Last 24 hours) at 05/24/2021 1256 Last data filed at 05/24/2021 1051 Gross per 24 hour  Intake 240  ml  Output --  Net 240 ml   Filed Weights   05/20/21 0500 05/21/21 0500 05/24/21 0500  Weight: 59 kg 60.7 kg 59.3 kg    Examination:  General exam: No acute distress.    Respiratory system: Bilateral coarse crackles.  Normal work of breathing. Cardiovascular system: S1-S2, regular rate and rhythm, 2/6 systolic murmur  gastrointestinal system: Thin, nontender, nondistended, normal bowel sounds Central nervous system: Alert and oriented.  No focal deficits Extremities: Bilateral BKA Skin: Scattered excoriations.  No ulcers or induration Psychiatry: Judgement and insight appear normal. Mood & affect appropriate.     Data Reviewed: I have personally reviewed following labs and imaging studies  CBC: Recent Labs  Lab 05/20/21 0437 05/20/21 1420 05/21/21 0522 05/22/21 0603 05/23/21 0505 05/24/21 0619  WBC 9.2  --  9.9 8.0 8.2 7.5  NEUTROABS 6.9  --  7.8* 5.9 5.9 5.3  HGB 5.8* 7.6* 8.8* 8.3* 7.8* 7.5*  HCT 19.0* 23.3* 28.1* 26.5* 25.0* 24.2*  MCV 84.8  --  85.9 84.9 85.0 86.7  PLT 122*  --  178 140* 133* 196*   Basic Metabolic Panel: Recent Labs  Lab  05/19/21 0405 05/20/21 0437 05/21/21 0522 05/22/21 0603 05/23/21 0505  NA 137 130* 137 134* 135  K 5.3* 5.6* 4.1 4.6 3.8  CL 97* 94* 96* 95* 98  CO2 26 25 30 28 31   GLUCOSE 200* 329* 190* 164* 176*  BUN 60* 88* 44* 59* 38*  CREATININE 6.88* 7.81* 4.76* 6.08* 4.23*  CALCIUM 8.5* 7.9* 8.2* 8.0* 8.3*  PHOS 6.9*  --   --   --   --    GFR: Estimated Creatinine Clearance: 16 mL/min (A) (by C-G formula based on SCr of 4.23 mg/dL (H)). Liver Function Tests: No results for input(s): AST, ALT, ALKPHOS, BILITOT, PROT, ALBUMIN in the last 168 hours. No results for input(s): LIPASE, AMYLASE in the last 168 hours. No results for input(s): AMMONIA in the last 168 hours. Coagulation Profile: No results for input(s): INR, PROTIME in the last 168 hours. Cardiac Enzymes: No results for input(s): CKTOTAL, CKMB, CKMBINDEX, TROPONINI  in the last 168 hours. BNP (last 3 results) No results for input(s): PROBNP in the last 8760 hours. HbA1C: No results for input(s): HGBA1C in the last 72 hours.  CBG: Recent Labs  Lab 05/23/21 1201 05/23/21 1601 05/23/21 2130 05/24/21 0757 05/24/21 1121  GLUCAP 161* 150* 143* 146* 237*   Lipid Profile: No results for input(s): CHOL, HDL, LDLCALC, TRIG, CHOLHDL, LDLDIRECT in the last 72 hours. Thyroid Function Tests: No results for input(s): TSH, T4TOTAL, FREET4, T3FREE, THYROIDAB in the last 72 hours. Anemia Panel: No results for input(s): VITAMINB12, FOLATE, FERRITIN, TIBC, IRON, RETICCTPCT in the last 72 hours. Sepsis Labs: Recent Labs  Lab 05/18/21 0143  PROCALCITON 1.09    Recent Results (from the past 240 hour(s))  Resp Panel by RT-PCR (Flu A&B, Covid) Nasopharyngeal Swab     Status: None   Collection Time: 05/18/21  1:43 AM   Specimen: Nasopharyngeal Swab; Nasopharyngeal(NP) swabs in vial transport medium  Result Value Ref Range Status   SARS Coronavirus 2 by RT PCR NEGATIVE NEGATIVE Final    Comment: (NOTE) SARS-CoV-2 target nucleic acids are NOT DETECTED.  The SARS-CoV-2 RNA is generally detectable in upper respiratory specimens during the acute phase of infection. The lowest concentration of SARS-CoV-2 viral copies this assay can detect is 138 copies/mL. A negative result does not preclude SARS-Cov-2 infection and should not be used as the sole basis for treatment or other patient management decisions. A negative result may occur with  improper specimen collection/handling, submission of specimen other than nasopharyngeal swab, presence of viral mutation(s) within the areas targeted by this assay, and inadequate number of viral copies(<138 copies/mL). A negative result must be combined with clinical observations, patient history, and epidemiological information. The expected result is Negative.  Fact Sheet for Patients:   EntrepreneurPulse.com.au  Fact Sheet for Healthcare Providers:  IncredibleEmployment.be  This test is no t yet approved or cleared by the [place] FDA and  has been authorized for detection and/or diagnosis of SARS-CoV-2 by FDA under an Emergency Use Authorization (EUA). This EUA will remain  in effect (meaning this test can be used) for the duration of the COVID-19 declaration under Section 564(b)(1) of the Act, 21 U.S.C.section 360bbb-3(b)(1), unless the authorization is terminated  or revoked sooner.       Influenza A by PCR NEGATIVE NEGATIVE Final   Influenza B by PCR NEGATIVE NEGATIVE Final    Comment: (NOTE) The Xpert Xpress SARS-CoV-2/FLU/RSV plus assay is intended as an aid in the diagnosis of influenza from Nasopharyngeal swab specimens and should  not be used as a sole basis for treatment. Nasal washings and aspirates are unacceptable for Xpert Xpress SARS-CoV-2/FLU/RSV testing.  Fact Sheet for Patients: EntrepreneurPulse.com.au  Fact Sheet for Healthcare Providers: IncredibleEmployment.be  This test is not yet approved or cleared by the [place] FDA and has been authorized for detection and/or diagnosis of SARS-CoV-2 by FDA under an Emergency Use Authorization (EUA). This EUA will remain in effect (meaning this test can be used) for the duration of the COVID-19 declaration under Section 564(b)(1) of the Act, 21 U.S.C. section 360bbb-3(b)(1), unless the authorization is terminated or revoked.  Performed at [facility], [address] [place], [place] [address]   Blood culture (routine x 2)     Status: None   Collection Time: 05/18/21  1:44 AM   Specimen: BLOOD  Result Value Ref Range Status   Specimen Description BLOOD RIGHT ANTECUBITAL  Final   Special Requests   Final    BOTTLES DRAWN AEROBIC AND ANAEROBIC Blood Culture adequate volume   Culture   Final    NO  GROWTH 5 DAYS Performed at [facility], [address] [place], [place] [address]    Report Status 05/23/2021 FINAL  Final  Blood culture (routine x 2)     Status: None   Collection Time: 05/18/21  1:44 AM   Specimen: BLOOD  Result Value Ref Range Status   Specimen Description BLOOD BLOOD RIGHT FOREARM  Final   Special Requests   Final    BOTTLES DRAWN AEROBIC AND ANAEROBIC Blood Culture adequate volume   Culture   Final    NO GROWTH 5 DAYS Performed at [facility], [address] [place], [place] [address]    Report Status 05/23/2021 FINAL  Final  Acid Fast Smear (AFB)     Status: None   Collection Time: 05/18/21 11:25 AM   Specimen: PATH Cytology Pleural fluid  Result Value Ref Range Status   AFB Specimen Processing Concentration  Final   Acid Fast Smear Negative  Final    Comment: (NOTE) Performed At: [facility] [place], [place] [id number] Rush Farmer MD YS:[id number]    Source (AFB) PLEURAL  Final    Comment: Performed at [facility], [address] [place], [place] [address]  Body fluid culture w Gram Stain     Status: None   Collection Time: 05/18/21 11:25 AM   Specimen: PATH Cytology Pleural fluid  Result Value Ref Range Status   Specimen Description   Final    PLEURAL Performed at [facility], [address] [place], [place] [address]    Special Requests   Final    NONE Performed at [facility], [address] [place], [place] [address]    Gram Stain   Final    RARE WBC PRESENT, PREDOMINANTLY MONONUCLEAR NO ORGANISMS SEEN    Culture   Final    NO GROWTH 3 DAYS Performed at [facility], [place] [address] [place],  [address]    Report Status 05/21/2021 FINAL  Final  Respiratory (~20 pathogens) panel by PCR     Status: None   Collection Time: 05/20/21  7:41 AM   Specimen: Nasopharyngeal Swab; Respiratory  Result Value Ref Range Status   Adenovirus NOT DETECTED  NOT DETECTED Final   Coronavirus 229E NOT DETECTED NOT DETECTED Final    Comment: (NOTE) The Coronavirus on the Respiratory Panel, DOES NOT test for the novel  Coronavirus (2019 nCoV)  Coronavirus HKU1 NOT DETECTED NOT DETECTED Final   Coronavirus NL63 NOT DETECTED NOT DETECTED Final   Coronavirus OC43 NOT DETECTED NOT DETECTED Final   Metapneumovirus NOT DETECTED NOT DETECTED Final   Rhinovirus / Enterovirus NOT DETECTED NOT DETECTED Final   Influenza A NOT DETECTED NOT DETECTED Final   Influenza B NOT DETECTED NOT DETECTED Final   Parainfluenza Virus 1 NOT DETECTED NOT DETECTED Final   Parainfluenza Virus 2 NOT DETECTED NOT DETECTED Final   Parainfluenza Virus 3 NOT DETECTED NOT DETECTED Final   Parainfluenza Virus 4 NOT DETECTED NOT DETECTED Final   Respiratory Syncytial Virus NOT DETECTED NOT DETECTED Final   Bordetella pertussis NOT DETECTED NOT DETECTED Final   Bordetella Parapertussis NOT DETECTED NOT DETECTED Final   Chlamydophila pneumoniae NOT DETECTED NOT DETECTED Final   Mycoplasma pneumoniae NOT DETECTED NOT DETECTED Final    Comment: Performed at [facility], [place] [address], [place], [place] [address]      Scheduled Meds:  arformoterol  15 mcg Nebulization BID   vitamin C  500 mg Oral BID   aspirin  81 mg Oral Daily   atorvastatin  80 mg Oral Daily   budesonide (PULMICORT) nebulizer solution  0.25 mg Nebulization BID   carvedilol  25 mg Oral BID   Chlorhexidine Gluconate Cloth  6 each Topical Q0600   Chlorhexidine Gluconate Cloth  6 each Topical Q0600   epoetin (EPOGEN/PROCRIT) injection  10,000 Units Intravenous Q M,W,F-HD   feeding supplement (NEPRO CARB STEADY)  237 mL Oral TID BM   ferric citrate  420 mg Oral TID WC   gabapentin  400 mg Oral QHS   heparin  5,000 Units Subcutaneous Q8H   hydrALAZINE  100 mg Oral Q8H   insulin aspart  0-5 Units Subcutaneous QHS   insulin aspart  0-9 Units Subcutaneous TID WC   insulin detemir  8 Units Subcutaneous  Daily   ipratropium-albuterol  3 mL Nebulization BID   isosorbide dinitrate  10 mg Oral TID   levETIRAcetam  500 mg Oral Daily   lisinopril  40 mg Oral Daily   mouth rinse  15 mL Mouth Rinse q12n4p   multivitamin  1 tablet Oral QHS   sevelamer carbonate  1,600 mg Oral TID WC   Continuous Infusions:     LOS: 5 days    Time spent: 35 minutes    Max Sane, MD [facility] Pager [phone]  If 7PM-7AM, please contact night-coverage 05/24/2021, 12:56 PM

## 2021-05-24 NOTE — Progress Notes (Addendum)
[facility]  ROUNDING NOTE   Subjective:    Patient seen resting in bed Completed breakfast tray at bedside Denies nausea and vomiting Denies shortness of breath  Patient seen later with wife at bedside Wife feels that home health would not be available enough to allow her to keep patient at home. She states he ate a second breakfast tray   Objective:  Vital signs in last 24 hours:  Temp:  [98.2 F (36.8 C)-98.8 F (37.1 C)] 98.7 F (37.1 C) (08/07 1120) Pulse Rate:  [73-78] 75 (08/07 1120) Resp:  [16-20] 20 (08/07 1120) BP: (140-169)/(66-75) 140/70 (08/07 1120) SpO2:  [95 %-100 %] 98 % (08/07 1120) Weight:  [59.3 kg] 59.3 kg (08/07 0500)  Weight change:  Filed Weights   05/20/21 0500 05/21/21 0500 05/24/21 0500  Weight: 59 kg 60.7 kg 59.3 kg    Intake/Output: I/O last 3 completed shifts: In: 107 [P.O.:277] Out: -    Intake/Output this shift:  Total I/O In: 240 [P.O.:240] Out: -   Physical Exam: General: NAD, laying in bed  Head: Normocephalic, atraumatic. Moist oral mucosal membranes  Eyes: Anicteric  Lungs:  Clear, room air  Heart: Regular rate and rhythm  Abdomen:  Soft, nontender  Extremities:  Bilateral BKA  Neurologic: Alert, and oriented  Skin: No lesions  Access: LUE AVF    Basic Metabolic Panel: Recent Labs  Lab 05/19/21 0405 05/20/21 0437 05/21/21 0522 05/22/21 0603 05/23/21 0505  NA 137 130* 137 134* 135  K 5.3* 5.6* 4.1 4.6 3.8  CL 97* 94* 96* 95* 98  CO2 26 25 30 28 31   GLUCOSE 200* 329* 190* 164* 176*  BUN 60* 88* 44* 59* 38*  CREATININE 6.88* 7.81* 4.76* 6.08* 4.23*  CALCIUM 8.5* 7.9* 8.2* 8.0* 8.3*  PHOS 6.9*  --   --   --   --      Liver Function Tests: No results for input(s): AST, ALT, ALKPHOS, BILITOT, PROT, ALBUMIN in the last 168 hours.  No results for input(s): LIPASE, AMYLASE in the last 168 hours. No results for input(s): AMMONIA in the last 168 hours.  CBC: Recent Labs  Lab 05/20/21 0437  05/20/21 1420 05/21/21 0522 05/22/21 0603 05/23/21 0505 05/24/21 0619  WBC 9.2  --  9.9 8.0 8.2 7.5  NEUTROABS 6.9  --  7.8* 5.9 5.9 5.3  HGB 5.8* 7.6* 8.8* 8.3* 7.8* 7.5*  HCT 19.0* 23.3* 28.1* 26.5* 25.0* 24.2*  MCV 84.8  --  85.9 84.9 85.0 86.7  PLT 122*  --  178 140* 133* 141*     Cardiac Enzymes: No results for input(s): CKTOTAL, CKMB, CKMBINDEX, TROPONINI in the last 168 hours.  BNP: Invalid input(s): POCBNP  CBG: Recent Labs  Lab 05/23/21 1201 05/23/21 1601 05/23/21 2130 05/24/21 0757 05/24/21 1121  GLUCAP 161* 150* 143* 146* 237*     Microbiology: Results for orders placed or performed during the hospital encounter of 05/18/21  Resp Panel by RT-PCR (Flu A&B, Covid) Nasopharyngeal Swab     Status: None   Collection Time: 05/18/21  1:43 AM   Specimen: Nasopharyngeal Swab; Nasopharyngeal(NP) swabs in vial transport medium  Result Value Ref Range Status   SARS Coronavirus 2 by RT PCR NEGATIVE NEGATIVE Final    Comment: (NOTE) SARS-CoV-2 target nucleic acids are NOT DETECTED.  The SARS-CoV-2 RNA is generally detectable in upper respiratory specimens during the acute phase of infection. The lowest concentration of SARS-CoV-2 viral copies this assay can detect is 138 copies/mL. A  negative result does not preclude SARS-Cov-2 infection and should not be used as the sole basis for treatment or other patient management decisions. A negative result may occur with  improper specimen collection/handling, submission of specimen other than nasopharyngeal swab, presence of viral mutation(s) within the areas targeted by this assay, and inadequate number of viral copies(<138 copies/mL). A negative result must be combined with clinical observations, patient history, and epidemiological information. The expected result is Negative.  Fact Sheet for Patients:  EntrepreneurPulse.com.au  Fact Sheet for Healthcare Providers:   IncredibleEmployment.be  This test is no t yet approved or cleared by the [place] FDA and  has been authorized for detection and/or diagnosis of SARS-CoV-2 by FDA under an Emergency Use Authorization (EUA). This EUA will remain  in effect (meaning this test can be used) for the duration of the COVID-19 declaration under Section 564(b)(1) of the Act, 21 U.S.C.section 360bbb-3(b)(1), unless the authorization is terminated  or revoked sooner.       Influenza A by PCR NEGATIVE NEGATIVE Final   Influenza B by PCR NEGATIVE NEGATIVE Final    Comment: (NOTE) The Xpert Xpress SARS-CoV-2/FLU/RSV plus assay is intended as an aid in the diagnosis of influenza from Nasopharyngeal swab specimens and should not be used as a sole basis for treatment. Nasal washings and aspirates are unacceptable for Xpert Xpress SARS-CoV-2/FLU/RSV testing.  Fact Sheet for Patients: EntrepreneurPulse.com.au  Fact Sheet for Healthcare Providers: IncredibleEmployment.be  This test is not yet approved or cleared by the [place] FDA and has been authorized for detection and/or diagnosis of SARS-CoV-2 by FDA under an Emergency Use Authorization (EUA). This EUA will remain in effect (meaning this test can be used) for the duration of the COVID-19 declaration under Section 564(b)(1) of the Act, 21 U.S.C. section 360bbb-3(b)(1), unless the authorization is terminated or revoked.  Performed at [facility], [address] [place], [place] [address]   Blood culture (routine x 2)     Status: None   Collection Time: 05/18/21  1:44 AM   Specimen: BLOOD  Result Value Ref Range Status   Specimen Description BLOOD RIGHT ANTECUBITAL  Final   Special Requests   Final    BOTTLES DRAWN AEROBIC AND ANAEROBIC Blood Culture adequate volume   Culture   Final    NO GROWTH 5 DAYS Performed at [facility], [address], [place], [place]  [address]    Report Status 05/23/2021 FINAL  Final  Blood culture (routine x 2)     Status: None   Collection Time: 05/18/21  1:44 AM   Specimen: BLOOD  Result Value Ref [place] Status   Specimen Description BLOOD BLOOD RIGHT FOREARM  Final   Special Requests   Final    BOTTLES DRAWN AEROBIC AND ANAEROBIC Blood Culture adequate volume   Culture   Final    NO GROWTH 5 DAYS Performed at [facility], [address] [place], [place] [address]    Report Status 05/23/2021 FINAL  Final  Acid Fast Smear (AFB)     Status: None   Collection Time: 05/18/21 11:25 AM   Specimen: PATH Cytology Pleural fluid  Result Value Ref Range Status   AFB Specimen Processing Concentration  Final   Acid Fast Smear Negative  Final    Comment: (NOTE) Performed At: [facility] [place], [place] [id number] Rush Farmer MD [id number]    Source (AFB) PLEURAL  Final    Comment: Performed at [facility], [phone] [place], [place] [address]  Body fluid culture w Gram Stain     Status: None   Collection Time: 05/18/21 11:25 AM   Specimen: PATH Cytology Pleural fluid  Result Value Ref Range Status   Specimen Description   Final    PLEURAL Performed at [facility], [address] [place], [place] [address]    Special Requests   Final    NONE Performed at [facility], [address] [place], [place] [address]    Gram Stain   Final    RARE WBC PRESENT, PREDOMINANTLY MONONUCLEAR NO ORGANISMS SEEN    Culture   Final    NO GROWTH 3 DAYS Performed at [facility], [place] [place], [place] [address]    Report Status 05/21/2021 FINAL  Final  Respiratory (~20 pathogens) panel by PCR     Status: None   Collection Time: 05/20/21  7:41 AM   Specimen: Nasopharyngeal Swab; Respiratory  Result Value Ref Range Status   Adenovirus NOT DETECTED NOT DETECTED Final   Coronavirus 229E NOT DETECTED NOT DETECTED Final    Comment:  (NOTE) The Coronavirus on the Respiratory Panel, DOES NOT test for the novel  Coronavirus (2019 nCoV)    Coronavirus HKU1 NOT DETECTED NOT DETECTED Final   Coronavirus NL63 NOT DETECTED NOT DETECTED Final   Coronavirus OC43 NOT DETECTED NOT DETECTED Final   Metapneumovirus NOT DETECTED NOT DETECTED Final   Rhinovirus / Enterovirus NOT DETECTED NOT DETECTED Final   Influenza A NOT DETECTED NOT DETECTED Final   Influenza B NOT DETECTED NOT DETECTED Final   Parainfluenza Virus 1 NOT DETECTED NOT DETECTED Final   Parainfluenza Virus 2 NOT DETECTED NOT DETECTED Final   Parainfluenza Virus 3 NOT DETECTED NOT DETECTED Final   Parainfluenza Virus 4 NOT DETECTED NOT DETECTED Final   Respiratory Syncytial Virus NOT DETECTED NOT DETECTED Final   Bordetella pertussis NOT DETECTED NOT DETECTED Final   Bordetella Parapertussis NOT DETECTED NOT DETECTED Final   Chlamydophila pneumoniae NOT DETECTED NOT DETECTED Final   Mycoplasma pneumoniae NOT DETECTED NOT DETECTED Final    Comment: Performed at [facility], [place], [place],  [address]    Coagulation Studies: No results for input(s): LABPROT, INR in the last 72 hours.  Urinalysis: No results for input(s): COLORURINE, LABSPEC, PHURINE, GLUCOSEU, HGBUR, BILIRUBINUR, KETONESUR, PROTEINUR, UROBILINOGEN, NITRITE, LEUKOCYTESUR in the last 72 hours.  Invalid input(s): APPERANCEUR    Imaging: No results found.   Medications:      arformoterol  15 mcg Nebulization BID   vitamin C  500 mg Oral BID   aspirin  81 mg Oral Daily   atorvastatin  80 mg Oral Daily   budesonide (PULMICORT) nebulizer solution  0.25 mg Nebulization BID   carvedilol  25 mg Oral BID   Chlorhexidine Gluconate Cloth  6 each Topical Q0600   Chlorhexidine Gluconate Cloth  6 each Topical Q0600   epoetin (EPOGEN/PROCRIT) injection  10,000 Units Intravenous Q M,W,F-HD   feeding supplement (NEPRO CARB STEADY)  237 mL Oral TID BM   ferric citrate  420 mg  Oral TID WC   gabapentin  400 mg Oral QHS   heparin  5,000 Units Subcutaneous Q8H   hydrALAZINE  100 mg Oral Q8H   insulin aspart  0-5 Units Subcutaneous QHS   insulin aspart  0-9 Units Subcutaneous TID WC   insulin detemir  8 Units Subcutaneous Daily   ipratropium-albuterol  3 mL  Nebulization BID   isosorbide dinitrate  10 mg Oral TID   levETIRAcetam  500 mg Oral Daily   lisinopril  40 mg Oral Daily   mouth rinse  15 mL Mouth Rinse q12n4p   multivitamin  1 tablet Oral QHS   sevelamer carbonate  1,600 mg Oral TID WC   acetaminophen **OR** acetaminophen  Assessment/ Plan:  [name] is a 61 y.o. black male with end stage renal disease on hemodialysis, diabetes mellitus type II, hypertension, CVA, peripheral vascular disease status post bilateral BKA and ESRD on dialysis. Patient presented to ED with shortness of breath and cough. He has been admitted to [facility] for SOB (shortness of breath) [R06.02] Pleural effusion [J90] Healthcare-associated pneumonia [J18.9] Acute respiratory failure with hypoxia ([place]) [J96.01] Acute on chronic anemia [D64.9] Acute decompensated heart failure ([place]) [I50.9]   [facility]/MWF/LUE AVF  End stage renal disease on dialysis  - Dialysis on MWF, Next treatment scheduled for Monday - D/c plan includes SNF placement. If placed in [place], patient would have transfer to dialysis outpatient clinic in [place]. Will notify dialysis coordinator of this possible need.   2. Acute respiratory failure: requiring oxygen. Status post left thoracentesis on 8/1. 1 Liter removed.  - Supportive care - Weaned to room air - Stress dose steroids, nebs as scheduled - Appreciate pulmonology recs   3. Anemia of chronic kidney disease : PRBC transfusion yesterday Lab Results  Component Value Date   HGB 7.5 (L) 05/24/2021  EPO 10000 units with HD treatments.    4. Secondary Hyperparathyroidism: with hyperphosphatemia Lab Results  Component Value  Date   PTH 283 (H) 01/08/2019   CALCIUM 8.3 (L) 05/23/2021   CAION 1.10 (L) 09/12/2020   PHOS 6.9 (H) 05/19/2021  - Phosphorus and calcium not at goal - Auryxia and sevelamer with meals.   5. Hypertension: Current regimen of clonidine, amlodipine, carvedilol, and hydralazine.  BP stable for this patient. 140/70    LOS: 5   8/7/202211:32 AM

## 2021-05-24 NOTE — Progress Notes (Signed)
Progress Note  Patient Name: [name] Date of Encounter: 05/24/2021  Primary Cardiologist: New to [facility] - consult by End  Subjective   No chest pain, dyspnea, palpitations, dizziness, presyncope, or syncope. HGB down trending following pRBC transfusion on 8/3. BP mildly elevated this morning, awaiting AM medications.    Inpatient Medications    Scheduled Meds:  arformoterol  15 mcg Nebulization BID   vitamin C  500 mg Oral BID   aspirin  81 mg Oral Daily   atorvastatin  80 mg Oral Daily   budesonide (PULMICORT) nebulizer solution  0.25 mg Nebulization BID   carvedilol  25 mg Oral BID   Chlorhexidine Gluconate Cloth  6 each Topical Q0600   Chlorhexidine Gluconate Cloth  6 each Topical Q0600   epoetin (EPOGEN/PROCRIT) injection  10,000 Units Intravenous Q M,W,F-HD   feeding supplement (NEPRO CARB STEADY)  237 mL Oral TID BM   ferric citrate  420 mg Oral TID WC   gabapentin  400 mg Oral QHS   heparin  5,000 Units Subcutaneous Q8H   hydrALAZINE  50 mg Oral Q6H   insulin aspart  0-5 Units Subcutaneous QHS   insulin aspart  0-9 Units Subcutaneous TID WC   insulin detemir  8 Units Subcutaneous Daily   ipratropium-albuterol  3 mL Nebulization BID   isosorbide dinitrate  10 mg Oral TID   levETIRAcetam  500 mg Oral Daily   lisinopril  40 mg Oral Daily   mouth rinse  15 mL Mouth Rinse q12n4p   multivitamin  1 tablet Oral QHS   sevelamer carbonate  1,600 mg Oral TID WC   Continuous Infusions:   PRN Meds: acetaminophen **OR** acetaminophen   Vital Signs    Vitals:   05/23/21 1953 05/24/21 0005 05/24/21 0500 05/24/21 0516  BP: (!) 150/69 (!) 169/71  (!) 158/71  Pulse: 75 78  73  Resp: 16 20  16   Temp: 98.8 F (37.1 C) 98.6 F (37 C)  98.5 F (36.9 C)  TempSrc:  Oral    SpO2: 95% 100%  100%  Weight:   59.3 kg   Height:       No intake or output data in the 24 hours ending 05/24/21 0728  Filed Weights   05/20/21 0500 05/21/21 0500 05/24/21 0500  Weight: 59  kg 60.7 kg 59.3 kg    Telemetry    Not on telemetry - Personally Reviewed  ECG    No new tracings - Personally Reviewed  Physical Exam   GEN: No acute distress.  Cachectic appearing. Neck: No JVD. Cardiac: RRR, I/VI systolic murmur LLSB, no rubs, or gallops.  Respiratory: Clear to auscultation bilaterally.  GI: Soft, nontender, non-distended.   MS: No stump edema status post bilateral BKA. Neuro:  Alert and oriented x 3; Nonfocal.  Psych: Normal affect.  Labs    Chemistry Recent Labs  Lab 05/21/21 0522 05/22/21 0603 05/23/21 0505  NA 137 134* 135  K 4.1 4.6 3.8  CL 96* 95* 98  CO2 30 28 31   GLUCOSE 190* 164* 176*  BUN 44* 59* 38*  CREATININE 4.76* 6.08* 4.23*  CALCIUM 8.2* 8.0* 8.3*  GFRNONAA 13* 10* 15*  ANIONGAP 11 11 6       Hematology Recent Labs  Lab 05/22/21 0603 05/23/21 0505 05/24/21 0619  WBC 8.0 8.2 7.5  RBC 3.12* 2.94* 2.79*  HGB 8.3* 7.8* 7.5*  HCT 26.5* 25.0* 24.2*  MCV 84.9 85.0 86.7  MCH 26.6 26.5 26.9  MCHC 31.3 31.2 31.0  RDW 18.9* 18.7* 18.8*  PLT 140* 133* 141*     Cardiac EnzymesNo results for input(s): TROPONINI in the last 168 hours. No results for input(s): TROPIPOC in the last 168 hours.   BNP Recent Labs  Lab 05/18/21 0143  BNP >4,500.0*      DDimer  Recent Labs  Lab 05/19/21 1346  DDIMER 1.22*      Radiology    CT Angio Chest Pulmonary Embolism (PE) W or WO Contrast  Result Date: 05/20/2021 IMPRESSION: No evidence of pulmonary embolus. Cardiomegaly, coronary artery disease. Small bilateral effusions. Bilateral lower lobe airspace opacities with some improvement on the left since prior study. Findings could reflect edema or infection. Aortic Atherosclerosis (ICD10-I70.0). Electronically Signed   By: Rolm Baptise M.D.   On: 05/20/2021 08:37    Cardiac Studies   2D echo 04/2021: 1. Left ventricular ejection fraction, by estimation, is 30 to 35%. The  left ventricle has moderately decreased function. The left  ventricle  demonstrates global hypokinesis. The left ventricular internal cavity size  was mildly dilated. There is mild  left ventricular hypertrophy. Left ventricular diastolic parameters are  consistent with Grade II diastolic dysfunction (pseudonormalization).  Elevated left atrial pressure.   2. Right ventricular systolic function is moderately reduced. The right  ventricular size is moderately enlarged. There is normal pulmonary artery  systolic pressure.   3. Left atrial size was severely dilated.   4. Large pleural effusion in the left lateral region.   5. The mitral valve is abnormal. Mild to moderate mitral valve  regurgitation. No evidence of mitral stenosis. Moderate mitral annular  calcification.   6. Tricuspid valve regurgitation is moderate.   7. The aortic valve is tricuspid. There is moderate calcification of the  aortic valve. There is moderate thickening of the aortic valve. Aortic  valve regurgitation is not visualized. Mild to moderate aortic valve  sclerosis/calcification is present,  without any evidence of aortic stenosis.   8. The inferior vena cava is normal in size with <50% respiratory  variability, suggesting right atrial pressure of 8 mmHg.  __________  2D echo 08/2020: 1. Left ventricular ejection fraction, by estimation, is 40 to 45%. The  left ventricle has mildly decreased function. The left ventricle  demonstrates global hypokinesis. There is moderate concentric left  ventricular hypertrophy. Left ventricular  diastolic parameters are consistent with Grade II diastolic dysfunction  (pseudonormalization). Elevated left atrial pressure.   2. Right ventricular systolic function is mildly reduced. The right  ventricular size is normal. Tricuspid regurgitation signal is inadequate  for assessing PA pressure.   3. Left atrial size was mildly dilated.   4. Right atrial size was mildly dilated.   5. The mitral valve is normal in structure. Trivial mitral  valve  regurgitation.   6. The aortic valve is tricuspid. There is mild calcification of the  aortic valve. There is mild thickening of the aortic valve. Aortic valve  regurgitation is not visualized. Mild aortic valve sclerosis is present,  with no evidence of aortic valve  stenosis.   7. Aortic dilatation noted. There is mild dilatation of the ascending  aorta, measuring 40 mm.   8. The inferior vena cava is normal in size with <50% respiratory  variability, suggesting right atrial pressure of 8 mmHg.   Patient Profile     60 y.o. male with history of ESRD on HD M WF, ICA, stroke, DM2, seizure disorder, PAD status post bilateral BKA, DVT, HTN,  HLD, anemia of chronic disease, and marijuana use who we are seeing for acute on chronic HFrEF.  Assessment & Plan    1.  Acute on chronic HFrEF with left-sided pleural effusion status post thoracentesis on 8/1 with 1 L of fluid removed: -Echo this admission shows a worsening cardiomyopathy with an EF of 35% with prior echo in 08/2020 demonstrating an EF of 45% -Etiology of his cardiomyopathy remains uncertain, though there is high suspicion for ischemic substrate given multiple comorbid conditions -Volume management per hemodialysis -Continue current GDMT including carvedilol, hydralazine/isosorbide and lisinopril  -Clonidine stopped 8/4 given soft BP, and to allow for titration of GDMT as tolerated  -Not currently on MRA/ARNI/SGLT2i given relative hypotension associated with ESRD status -Currently unable to pursue [facility] given transfusion dependent severe anemia, this can be revisited in the outpatient setting -With regards to his pleural effusion, cytology was lymphocyte predominant with further evaluation deferred to internal medicine  2.  Anemia of chronic disease: -Presenting hemoglobin of 5.8, improved to 8.3 status post PRBC on 8/3, now down trending -Recommend maintaining Hgb greater than 8  3.  ESRD on HD: -Volume management per  hemodialysis  4.  HTN: -BP briefly soft on 8/4 leading to the holding of clonidine -Elevated this morning, awaiting AM medications -Continue current medical therapy as outlined above, titrate as needed  5.  Cachexia: -Reports poor appetite  For questions or updates, please contact [place] Please consult [email or web address] for contact info under Cardiology/STEMI.    Signed, Christell Faith, PA-C [place] Pager: [phone] 05/24/2021, 7:28 AM

## 2021-05-24 NOTE — TOC Progression Note (Addendum)
Transition of Care [facility]) - Progression Note    Patient Details  Name: [name] MRN: [id number] Date of Birth: 08-17-1961  [facility] of [facility] CM/SW [place], LCSW Phone Number: 05/24/2021, 8:59 AM  Clinical Narrative:   CSW reached out to Tammy and Tina ([employer]) to see if they have had a chance to review this patient. Waiting for response.  2:23- Reached out to [employer] again. Still waiting on a reply. Also reached out to [place] with [employer] and Neoma Laming with [facility] and asked them to review pending bed request.   Expected Discharge Plan: [place] Barriers to Discharge: Continued Medical Work up  Expected Discharge Plan and Services Expected Discharge Plan: [place]   Discharge Planning Services: CM Consult Post Acute Care Choice: [place], Resumption of Svcs/PTA Provider Living arrangements for the past 2 months: Single Family Home                 DME Arranged: N/A DME Agency: NA       HH Arranged: RN, PT, OT, Nurse's Aide [place] Agency: [place] ([facility]) Date HH Agency Contacted: 05/18/21 Time [place]: 1452 Representative spoke with at [place]: [place] ([place]) Interventions    Readmission Risk Interventions Readmission Risk Prevention Plan 02/10/2021 02/08/2021 11/21/2020  Transportation Screening Complete Complete Complete  PCP or Specialist Appt within 5-7 Days - Complete -  Home Care Screening - Complete -  Medication Review (RN CM) - Complete -  HRI or Home Care Consult Complete - -  Palliative Care Screening Complete - -  Medication Review (RN Care Manager) Complete - -  Some recent data might be hidden

## 2021-05-24 NOTE — Plan of Care (Signed)
Pt noncompliant with position and recommendations for pressure prevention. Pt likes to sit up straight in bed with pressure on sacrum. Pt pulled out IV. States he just doesn't like on arm with sock. Rn spoke with pt regarding frequent pulling out of Ivs during night. IV replaced and kerlix covering place. Mit placed on left hand during night time to prevent pt from pulling out during sleep or due to agitation. Pt is still able to get items like food and drink with right hand.  Problem: Health Behavior/Discharge Planning: Goal: Ability to manage health-related needs will improve Outcome: Not Progressing   Problem: Clinical Measurements: Goal: Ability to maintain clinical measurements within normal limits will improve Outcome: Progressing Goal: Will remain free from infection Outcome: Progressing   Problem: Activity: Goal: Risk for activity intolerance will decrease Outcome: Progressing   Problem: Nutrition: Goal: Adequate nutrition will be maintained Outcome: Progressing   Problem: Elimination: Goal: Will not experience complications related to bowel motility Outcome: Progressing   Problem: Skin Integrity: Goal: Risk for impaired skin integrity will decrease Outcome: Progressing

## 2021-05-25 DIAGNOSIS — J9601 Acute respiratory failure with hypoxia: Secondary | ICD-10-CM | POA: Diagnosis not present

## 2021-05-25 DIAGNOSIS — D649 Anemia, unspecified: Secondary | ICD-10-CM | POA: Diagnosis not present

## 2021-05-25 DIAGNOSIS — I509 Heart failure, unspecified: Secondary | ICD-10-CM | POA: Diagnosis not present

## 2021-05-25 DIAGNOSIS — I5022 Chronic systolic (congestive) heart failure: Secondary | ICD-10-CM | POA: Diagnosis not present

## 2021-05-25 DIAGNOSIS — N186 End stage renal disease: Secondary | ICD-10-CM | POA: Diagnosis not present

## 2021-05-25 LAB — CBC WITH DIFFERENTIAL/PLATELET
Abs Immature Granulocytes: 0.12 10*3/uL — ABNORMAL HIGH (ref 0.00–0.07)
Basophils Absolute: 0 10*3/uL (ref 0.0–0.1)
Basophils Relative: 0 %
Eosinophils Absolute: 0.2 10*3/uL (ref 0.0–0.5)
Eosinophils Relative: 3 %
HCT: 24.4 % — ABNORMAL LOW (ref 39.0–52.0)
Hemoglobin: 7.5 g/dL — ABNORMAL LOW (ref 13.0–17.0)
Immature Granulocytes: 1 %
Lymphocytes Relative: 17 %
Lymphs Abs: 1.5 10*3/uL (ref 0.7–4.0)
MCH: 26.6 pg (ref 26.0–34.0)
MCHC: 30.7 g/dL (ref 30.0–36.0)
MCV: 86.5 fL (ref 80.0–100.0)
Monocytes Absolute: 0.7 10*3/uL (ref 0.1–1.0)
Monocytes Relative: 8 %
Neutro Abs: 5.9 10*3/uL (ref 1.7–7.7)
Neutrophils Relative %: 71 %
Platelets: 158 10*3/uL (ref 150–400)
RBC: 2.82 MIL/uL — ABNORMAL LOW (ref 4.22–5.81)
RDW: 19 % — ABNORMAL HIGH (ref 11.5–15.5)
WBC: 8.4 10*3/uL (ref 4.0–10.5)
nRBC: 0 % (ref 0.0–0.2)

## 2021-05-25 LAB — RESP PANEL BY RT-PCR (FLU A&B, COVID) ARPGX2
Influenza A by PCR: NEGATIVE
Influenza B by PCR: NEGATIVE
SARS Coronavirus 2 by RT PCR: NEGATIVE

## 2021-05-25 LAB — BASIC METABOLIC PANEL
Anion gap: 8 (ref 5–15)
BUN: 60 mg/dL — ABNORMAL HIGH (ref 6–20)
CO2: 31 mmol/L (ref 22–32)
Calcium: 8.5 mg/dL — ABNORMAL LOW (ref 8.9–10.3)
Chloride: 96 mmol/L — ABNORMAL LOW (ref 98–111)
Creatinine, Ser: 6.45 mg/dL — ABNORMAL HIGH (ref 0.61–1.24)
GFR, Estimated: 9 mL/min — ABNORMAL LOW (ref >60)
Glucose, Bld: 120 mg/dL — ABNORMAL HIGH (ref 70–99)
Potassium: 4.3 mmol/L (ref 3.5–5.1)
Sodium: 135 mmol/L (ref 135–145)

## 2021-05-25 LAB — GLUCOSE, CAPILLARY
Glucose-Capillary: 125 mg/dL — ABNORMAL HIGH (ref 70–99)
Glucose-Capillary: 110 mg/dL — ABNORMAL HIGH (ref 70–99)
Glucose-Capillary: 70 mg/dL (ref 70–99)
Glucose-Capillary: 127 mg/dL — ABNORMAL HIGH (ref 70–99)

## 2021-05-25 MED ORDER — ISOSORBIDE DINITRATE 20 MG PO TABS
20.0000 mg | ORAL_TABLET | Freq: Three times a day (TID) | ORAL | Status: DC
  Administered 2021-05-25 – 2021-05-26 (×4): 20 mg via ORAL
  Filled 2021-05-25 (×5): qty 1

## 2021-05-25 NOTE — Progress Notes (Signed)
OT Cancellation Note  Patient Details Name: [name] MRN: [id number] DOB: [birth year]   Cancelled Treatment:     Pt is off floor at HD. Will re-attempt OT tx as able.   Shanon Payor, OTD OTR/L  05/25/21, 9:09 AM

## 2021-05-25 NOTE — Progress Notes (Signed)
Physical Therapy Treatment Patient Details Name: [name] MRN: [id number] DOB: 29-Nov-1960 Today's Date: 05/25/2021    History of Present Illness Patient is a 60 y.o. black male with end stage renal disease on hemodialysis, diabetes mellitus type II, hypertension, CVA, peripheral vascular disease status post bilateral BKA and ESRD on dialysis. Patient presented to ED with shortness of breath and cough. Patient had rapid response called while in hemodialysis requiring transfer to ICU. Acute respiratory failure, weaned from BiPAP. Status post left thoracentesis on 8/1. CT chest negative for PE.    PT Comments    Pt received sitting upright in bed with OT in room finishing treatment. Pt agreeable for PT after OT session. Pt total assist for donning liners and prosthetics. Pt remains needing min-modA for bed mobility and standing with RW (bed elevated) with VC's for hand placement. Pt returned supine in bed then requesting pants be donned so PT performed supine to seated EOB to standing again to assist pt in donning pants with total assist so pt can maintain BUE support on RW for balance. +2 assist to return pt up in bed to prevent shearing forces on sacral wound. Pt mod-I with rolling in bed R/L to flatten sheets and chuck pad for pt comfort. Bed lowered and all needs within reach. Pt still would benefit from STR to return to baseline function of SPT with RW to varying surfaces prior to transitioning back to home environment.    Follow Up Recommendations  SNF     Equipment Recommendations  None recommended by PT    Recommendations for Other Services       Precautions / Restrictions Precautions Precautions: Fall Required Braces or Orthoses: Other Brace Other Brace: pt has BLE prosthetics Restrictions Weight Bearing Restrictions: No    Mobility  Bed Mobility Overal bed mobility: Needs Assistance Bed Mobility: Supine to Sit;Sit to Supine     Supine to sit: Mod assist Sit to  supine: Min assist        Transfers Overall transfer level: Needs assistance Equipment used: Rolling walker (2 wheeled) Transfers: Sit to/from Stand Sit to Stand: Max assist        Lateral/Scoot Transfers: Mod assist General transfer comment: cues for hand placement  Ambulation/Gait             General Gait Details: baseline mobility is SPT from different seated surfaces   Stairs             Wheelchair Mobility    Modified Rankin (Stroke Patients Only)       Balance Overall balance assessment: Needs assistance Sitting-balance support: No upper extremity supported;Feet supported Sitting balance-Leahy Scale: Fair Sitting balance - Comments: with prosthetics; Intermittent R lean on shoulder on elevated HOB.   Standing balance support: Bilateral upper extremity supported Standing balance-Leahy Scale: Poor Standing balance comment: UE support at RW for standing                            Cognition Arousal/Alertness: Awake/alert Behavior During Therapy: WFL for tasks assessed/performed Overall Cognitive Status: Within Functional Limits for tasks assessed                                 General Comments: Pt A&Ox4; follows 1 step directives for ADL task completion with 100% accuracy. Pt provides approrpiate directives on donning/doffing prosthetics      Exercises General Exercises -  Lower Extremity Hip ABduction/ADduction: AROM;Supine;Strengthening;Both;10 reps Straight Leg Raises: AROM;Strengthening;Supine;Both;10 reps Other Exercises Other Exercises: Pt requires pt in donning/doffing liners and prosthetics    General Comments        Pertinent Vitals/Pain Pain Assessment: No/denies pain    Home Living Family/patient expects to be discharged to:: Private residence Living Arrangements: Spouse/significant other Available Help at Discharge: Family Type of Home: House Home Access: Ramped entrance   Home Layout: One  [place]: Tub bench;Walker - 2 wheels;Crutches;Bedside commode;Wheelchair - manual      Prior Function Level of Independence: Needs assistance  Gait / Transfers Assistance Needed: pivot transfer using prosthesis, intermittent assistance from spouse ADL's / Homemaking Assistance Needed: patient required assistance for ADLs from spouse, including increased assistance for feeding recently Comments: Independent with RW for household distances, also uses w/c. Independent with ADLs. Family assists with IADLs   PT Goals (current goals can now be found in the care plan section) Acute Rehab PT Goals Patient Stated Goal: none stated PT Goal Formulation: With patient Time For Goal Achievement: 06/04/21 Potential to Achieve Goals: Good    Frequency    Min 2X/week      PT Plan      Co-evaluation              AM-PAC PT "6 Clicks" Mobility   Outcome Measure  Help needed turning from your back to your side while in a flat bed without using bedrails?: A Little Help needed moving from lying on your back to sitting on the side of a flat bed without using bedrails?: A Lot   Help needed standing up from a chair using your arms (e.g., wheelchair or bedside chair)?: A Lot Help needed to walk in hospital room?: Total Help needed climbing 3-5 steps with a railing? : Total 6 Click Score: 9    End of Session Equipment Utilized During Treatment: Gait belt Activity Tolerance: Patient tolerated treatment well Patient left: in bed;with call bell/phone within reach;with bed alarm set Nurse Communication: Mobility status PT Visit Diagnosis: Muscle weakness (generalized) (M62.81);Other abnormalities of gait and mobility (R26.89)     Time: 8984-2103 PT Time Calculation (min) (ACUTE ONLY): 33 min  Charges:  $Therapeutic Activity: 23-37 mins                    Daelan Gatt M. Fairly IV, PT, DPT Physical Therapist- [facility]  05/25/2021, 4:15 PM

## 2021-05-25 NOTE — Progress Notes (Signed)
PT Cancellation Note  Patient Details Name: DENVER BENTSON MRN: [id number] DOB: [birth year]   Cancelled Treatment:    Reason Eval/Treat Not Completed: Patient at procedure or test/unavailable. Per medical chart pt off floor for HD. Will re-attempt as available at later time.    Salem Caster. Fairly IV, PT, DPT Physical Therapist- [facility]  05/25/2021, 9:07 AM

## 2021-05-25 NOTE — Progress Notes (Addendum)
PROGRESS NOTE    [name]  UQJ:[id number] DOB: December 28, 1960 DOA: 05/18/2021 PCP: [name], MD    Brief Narrative:   60 year old male history of ESRD on hemodialysis MWF, hypertension, seizure disorder, anemia, systolic chronic heart failure, hidradenitis on [name], bilateral BKA who presents to the ED for evaluation of worsening shortness of breath.  Patient was recently admitted and discharged on 7/29 for similar complaints.  Now presents with persistent fluid overload, possible pneumonia associated with parapneumonic effusion.  Patient is chronically ill and majority of history is gained by speaking with patient's wife.  Patient's wife states that shortness of breath progressively worsened after discharge.  Patient has no associated chest pain, productive cough, fever, chills.  Initially required 10 L oxygen to maintain saturation greater than 90.  Hemoglobin also dropped to 6.8 from baseline approximate.ly 8.  Started on antibiotics for pneumonia, thoracentesis ordered, nephrology consulted.  8/1: Received page from ICU charge RN.  Patient had rapid response called while in hemodialysis.  I went and evaluated the patient at bedside.  Found him to be on a nonrebreather with saturations in the low to mid 80s.  Work of breathing not especially increased.  Lung sounds very coarse and rhonchorous.  Unable to wean off nonrebreather.  Of note patient did get a thoracentesis with 1 L fluid removed today.  No fluid removal was attempted with hemodialysis.  Patient only completed about 30 minutes of his dialysis session.   Will be transferred to stepdown unit for closer monitoring.  BiPAP initiated.  COPD treatment started.  Case discussed at length with ICU attending.  Consultation greatly appreciated  8/2: Respiratory status much improved.  Patient weaned off BiPAP.  Saturating well on 2 L nasal cannula.  Stable for transfer to medical floor.  Cardiology consult requested and appreciated. 8/3:  Was not looking too good and family was debating for comfort care.  Hemoglobin dropped to 5.8 requiring 1 unit of packed RBC transfusion 8/4 patient much more alert, hemoglobin stabilized and eating.  Family not interested in hospice anymore and wants to continue dialysis 8/5: Moved out on the floor 8/6: Waiting for SNF bed 8/7: Waiting for SNF 8/8: HD today.  No local SNF bed offered so patient's family has accepted SNF at [facility].  Dialysis coordinator is working on getting spot for dialysis center in [place] now    Assessment & Plan:   Principal Problem:   Acute respiratory failure with hypoxia ([place]) Active Problems:   Hypertension   Anemia due to chronic kidney disease   Crohn disease ([place])   Hidradenitis suppurativa   Type 2 diabetes mellitus with kidney complication, with long-term current use of insulin (HCC)   End stage renal failure on dialysis ([place])   History of CVA (cerebrovascular accident)   Seizure disorder ([place])   S/P bilateral BKA (below knee amputation) ([place])   Acute on chronic anemia   Chronic systolic CHF (congestive heart failure) ([place])   Long term current use of immunosuppressive drug   Acute decompensated heart failure (HCC)   Unstageable pressure ulcer of sacral region ([place])  Acute respiratory failure with hypoxia -present on admission and now resolved RRT called during dialysis on 8/1 for worsening hypoxia requiring transfer to stepdown for BiPAP  S/p thoracentesis on 8/1 with removal of 1 L of fluid.  Transudative Weaned off BiPAP as of 8/2, now weaned off nasal cannula oxygen and remains on room air. CT chest on 8/3 negative for PE  Acute on chronic  anemia of chronic kidney disease Baseline hemoglobin in the eights Presented with hemoglobin 6.8-> 5.8-> 8.3> 7.8> 7.5 Status post 1 unit PRBC on 8/3 with dialysis.  transfuse if hemoglobin less than 7  Community-acquired pneumonia Completed course of antibiotic.  Treated  Acute on chronic  decompensated systolic and diastolic congestive heart failure Echo showing EF of 30 to 35%.  Ischemic work-up not planned by cardiology due to severe anemia.  Medical management recommended. clonidine stopped per cardiology -Continue Coreg, hydralazine, isosorbide, lisinopril for now.    Essential hypertension Blood pressure trending up, clonidine stopped which could cause rebound hypertension -Continue Coreg, isosorbide, hydralazine and lisinopril -I will increase isosorbide from 10 mg to 20 mg p.o. 3 times daily for better blood pressure control  End-stage renal disease on hemodialysis HD per nephro.  Getting dialyzed today  Hyperlipidemia Continue statin/Lipitor 80 mg p.o. daily  Type 2 diabetes mellitus Continue sliding scale.  Continue insulin Levemir 8 units subcu daily  Seizure disorder Continue Keppra  History of hidradenitis  [name] on hold  Peripheral arterial disease status post bilateral BKA No acute issues  Severe Protein-calorie malnutrition As evidenced by muscle wasting Body mass index is 18.73 kg/m.   Goals of care Overall poor prognosis.   PT and OT does recommend SNF.  TOC aware and working on placement.  Unfortunately patient did not get any local area SNF offer and family has accepted SNF at [facility] so now dialysis coordinator is working on finding a dialysis spot around the [place] area   DVT prophylaxis: SQ heparin Code Status: Full code Family Communication: Spouse updated over phone on 8/7 Disposition Plan: Status is: Inpatient  Remains inpatient appropriate because:Inpatient level of care appropriate due to severity of illness  Dispo: The patient is from: Home              Anticipated d/c is to: SNF               Patient currently is medically stable to d/c.   Difficult to place patient Yes.  Now SNF in [place] is accepted.  Need to wait for dialysis spot to be coordinated there   Level of care: Med-Surg  Consultants:   Pulmonary Cardiology Nephrology  Procedures:  Thoracentesis 8/1  Antimicrobials:  Completed course of Rocephin and azithromycin   Subjective: No new complaints.  Seen at hemodialysis.  Blood pressure trending up  Objective: Vitals:   05/25/21 1145 05/25/21 1200 05/25/21 1215 05/25/21 1228  BP: (!) 201/87 (!) 195/87  (!) 179/81  Pulse:    83  Resp: (!) 25 (!) 23 (!) 27 (!) 21  Temp:    98.6 F (37 C)  TempSrc:    Oral  SpO2:    99%  Weight:      Height:        Intake/Output Summary (Last 24 hours) at 05/25/2021 1358 Last data filed at 05/25/2021 1200 Gross per 24 hour  Intake --  Output 1500 ml  Net -1500 ml   Filed Weights   05/21/21 0500 05/24/21 0500 05/25/21 0500  Weight: 60.7 kg 59.3 kg 60.9 kg    Examination:  General exam: No acute distress.    Respiratory system: Decreased breath sound the bases.  Normal work of breathing. Cardiovascular system: S1-S2, regular rate and rhythm, 2/6 systolic murmur  gastrointestinal system: Thin, nontender, nondistended, normal bowel sounds Central nervous system: Alert and oriented.  No focal deficits Extremities: Bilateral BKA Skin: Scattered excoriations.  No ulcers or induration Psychiatry:  Judgement and insight appear normal. Mood & affect appropriate.     Data Reviewed: I have personally reviewed following labs and imaging studies  CBC: Recent Labs  Lab 05/21/21 0522 05/22/21 0603 05/23/21 0505 05/24/21 0619 05/25/21 0512  WBC 9.9 8.0 8.2 7.5 8.4  NEUTROABS 7.8* 5.9 5.9 5.3 5.9  HGB 8.8* 8.3* 7.8* 7.5* 7.5*  HCT 28.1* 26.5* 25.0* 24.2* 24.4*  MCV 85.9 84.9 85.0 86.7 86.5  PLT 178 140* 133* 141* 601   Basic Metabolic Panel: Recent Labs  Lab 05/19/21 0405 05/20/21 0437 05/21/21 0522 05/22/21 0603 05/23/21 0505 05/25/21 0512  NA 137 130* 137 134* 135 135  K 5.3* 5.6* 4.1 4.6 3.8 4.3  CL 97* 94* 96* 95* 98 96*  CO2 26 25 30 28 31 31   GLUCOSE 200* 329* 190* 164* 176* 120*  BUN 60* 88* 44* 59* 38*  60*  CREATININE 6.88* 7.81* 4.76* 6.08* 4.23* 6.45*  CALCIUM 8.5* 7.9* 8.2* 8.0* 8.3* 8.5*  PHOS 6.9*  --   --   --   --   --    GFR: Estimated Creatinine Clearance: 10.8 mL/min (A) (by C-G formula based on SCr of 6.45 mg/dL (H)). Liver Function Tests: No results for input(s): AST, ALT, ALKPHOS, BILITOT, PROT, ALBUMIN in the last 168 hours. No results for input(s): LIPASE, AMYLASE in the last 168 hours. No results for input(s): AMMONIA in the last 168 hours. Coagulation Profile: No results for input(s): INR, PROTIME in the last 168 hours. Cardiac Enzymes: No results for input(s): CKTOTAL, CKMB, CKMBINDEX, TROPONINI in the last 168 hours. BNP (last 3 results) No results for input(s): PROBNP in the last 8760 hours. HbA1C: No results for input(s): HGBA1C in the last 72 hours.  CBG: Recent Labs  Lab 05/24/21 1121 05/24/21 1615 05/24/21 2034 05/25/21 0759 05/25/21 1230  GLUCAP 237* 170* 109* 125* 110*   Lipid Profile: No results for input(s): CHOL, HDL, LDLCALC, TRIG, CHOLHDL, LDLDIRECT in the last 72 hours. Thyroid Function Tests: No results for input(s): TSH, T4TOTAL, FREET4, T3FREE, THYROIDAB in the last 72 hours. Anemia Panel: No results for input(s): VITAMINB12, FOLATE, FERRITIN, TIBC, IRON, RETICCTPCT in the last 72 hours. Sepsis Labs: No results for input(s): PROCALCITON, LATICACIDVEN in the last 168 hours.   Recent Results (from the past 240 hour(s))  Resp Panel by RT-PCR (Flu A&B, Covid) Nasopharyngeal Swab     Status: None   Collection Time: 05/18/21  1:43 AM   Specimen: Nasopharyngeal Swab; Nasopharyngeal(NP) swabs in vial transport medium  Result Value Ref Range Status   SARS Coronavirus 2 by RT PCR NEGATIVE NEGATIVE Final    Comment: (NOTE) SARS-CoV-2 target nucleic acids are NOT DETECTED.  The SARS-CoV-2 RNA is generally detectable in upper respiratory specimens during the acute phase of infection. The lowest concentration of SARS-CoV-2 viral copies this  assay can detect is 138 copies/mL. A negative result does not preclude SARS-Cov-2 infection and should not be used as the sole basis for treatment or other patient management decisions. A negative result may occur with  improper specimen collection/handling, submission of specimen other than nasopharyngeal swab, presence of viral mutation(s) within the areas targeted by this assay, and inadequate number of viral copies(<138 copies/mL). A negative result must be combined with clinical observations, patient history, and epidemiological information. The expected result is Negative.  Fact Sheet for Patients:  EntrepreneurPulse.com.au  Fact Sheet for Healthcare Providers:  IncredibleEmployment.be  This test is no t yet approved or cleared by the [place] and  has been authorized for detection and/or diagnosis of SARS-CoV-2 by FDA under an Emergency Use Authorization (EUA). This EUA will remain  in effect (meaning this test can be used) for the duration of the COVID-19 declaration under Section 564(b)(1) of the Act, 21 U.S.C.section 360bbb-3(b)(1), unless the authorization is terminated  or revoked sooner.       Influenza A by PCR NEGATIVE NEGATIVE Final   Influenza B by PCR NEGATIVE NEGATIVE Final    Comment: (NOTE) The Xpert Xpress SARS-CoV-2/FLU/RSV plus assay is intended as an aid in the diagnosis of influenza from Nasopharyngeal swab specimens and should not be used as a sole basis for treatment. Nasal washings and aspirates are unacceptable for Xpert Xpress SARS-CoV-2/FLU/RSV testing.  Fact Sheet for Patients: EntrepreneurPulse.com.au  Fact Sheet for Healthcare Providers: IncredibleEmployment.be  This test is not yet approved or cleared by the [place] FDA and has been authorized for detection and/or diagnosis of SARS-CoV-2 by FDA under an Emergency Use Authorization (EUA). This EUA will  remain in effect (meaning this test can be used) for the duration of the COVID-19 declaration under Section 564(b)(1) of the Act, 21 U.S.C. section 360bbb-3(b)(1), unless the authorization is terminated or revoked.  Performed at [facility], [address] [place], [place] [address]   Blood culture (routine x 2)     Status: None   Collection Time: 05/18/21  1:44 AM   Specimen: BLOOD  Result Value Ref Range Status   Specimen Description BLOOD RIGHT ANTECUBITAL  Final   Special Requests   Final    BOTTLES DRAWN AEROBIC AND ANAEROBIC Blood Culture adequate volume   Culture   Final    NO GROWTH 5 DAYS Performed at [facility], [address] [place], [place] [address]    Report Status 05/23/2021 FINAL  Final  Blood culture (routine x 2)     Status: None   Collection Time: 05/18/21  1:44 AM   Specimen: BLOOD  Result Value Ref Range Status   Specimen Description BLOOD BLOOD RIGHT FOREARM  Final   Special Requests   Final    BOTTLES DRAWN AEROBIC AND ANAEROBIC Blood Culture adequate volume   Culture   Final    NO GROWTH 5 DAYS Performed at [facility], [address] [place], [place] [address]    Report Status 05/23/2021 FINAL  Final  Acid Fast Smear (AFB)     Status: None   Collection Time: 05/18/21 11:25 AM   Specimen: PATH Cytology Pleural fluid  Result Value Ref Range Status   AFB Specimen Processing Concentration  Final   Acid Fast Smear Negative  Final    Comment: (NOTE) Performed At: [facility] [place], [place] [id number] Rush Farmer MD SV:[id number]    Source (AFB) PLEURAL  Final    Comment: Performed at [facility], [address] [place], [place] [address]  Body fluid culture w Gram Stain     Status: None   Collection Time: 05/18/21 11:25 AM   Specimen: PATH Cytology Pleural fluid  Result Value Ref Range Status   Specimen Description   Final    PLEURAL Performed at [facility],  [address], [place], [place] [address]    Special Requests   Final    NONE Performed at [facility], [address] [place],  [address]    Gram Stain   Final    RARE WBC PRESENT, PREDOMINANTLY MONONUCLEAR NO ORGANISMS SEEN    Culture  Final    NO GROWTH 3 DAYS Performed at [facility], [place] [address] [place], [place] [address]    Report Status 05/21/2021 FINAL  Final  Respiratory (~20 pathogens) panel by PCR     Status: None   Collection Time: 05/20/21  7:41 AM   Specimen: Nasopharyngeal Swab; Respiratory  Result Value Ref Range Status   Adenovirus NOT DETECTED NOT DETECTED Final   Coronavirus 229E NOT DETECTED NOT DETECTED Final    Comment: (NOTE) The Coronavirus on the Respiratory Panel, DOES NOT test for the novel  Coronavirus (2019 nCoV)    Coronavirus HKU1 NOT DETECTED NOT DETECTED Final   Coronavirus NL63 NOT DETECTED NOT DETECTED Final   Coronavirus OC43 NOT DETECTED NOT DETECTED Final   Metapneumovirus NOT DETECTED NOT DETECTED Final   Rhinovirus / Enterovirus NOT DETECTED NOT DETECTED Final   Influenza A NOT DETECTED NOT DETECTED Final   Influenza B NOT DETECTED NOT DETECTED Final   Parainfluenza Virus 1 NOT DETECTED NOT DETECTED Final   Parainfluenza Virus 2 NOT DETECTED NOT DETECTED Final   Parainfluenza Virus 3 NOT DETECTED NOT DETECTED Final   Parainfluenza Virus 4 NOT DETECTED NOT DETECTED Final   Respiratory Syncytial Virus NOT DETECTED NOT DETECTED Final   Bordetella pertussis NOT DETECTED NOT DETECTED Final   Bordetella Parapertussis NOT DETECTED NOT DETECTED Final   Chlamydophila pneumoniae NOT DETECTED NOT DETECTED Final   Mycoplasma pneumoniae NOT DETECTED NOT DETECTED Final    Comment: Performed at [facility], [place], [place], [place] [address]  Resp Panel by RT-PCR (Flu A&B, Covid) Nasopharyngeal Swab     Status: None   Collection Time: 05/25/21 12:37 PM   Specimen: Nasopharyngeal Swab;  Nasopharyngeal(NP) swabs in vial transport medium  Result Value Ref Range Status   SARS Coronavirus 2 by RT PCR NEGATIVE NEGATIVE Final    Comment: (NOTE) SARS-CoV-2 target nucleic acids are NOT DETECTED.  The SARS-CoV-2 RNA is generally detectable in upper respiratory specimens during the acute phase of infection. The lowest concentration of SARS-CoV-2 viral copies this assay can detect is 138 copies/mL. A negative result does not preclude SARS-Cov-2 infection and should not be used as the sole basis for treatment or other patient management decisions. A negative result may occur with  improper specimen collection/handling, submission of specimen other than nasopharyngeal swab, presence of viral mutation(s) within the areas targeted by this assay, and inadequate number of viral copies(<138 copies/mL). A negative result must be combined with clinical observations, patient history, and epidemiological information. The expected result is Negative.  Fact Sheet for Patients:  EntrepreneurPulse.com.au  Fact Sheet for Healthcare Providers:  IncredibleEmployment.be  This test is no t yet approved or cleared by the [place] FDA and  has been authorized for detection and/or diagnosis of SARS-CoV-2 by FDA under an Emergency Use Authorization (EUA). This EUA will remain  in effect (meaning this test can be used) for the duration of the COVID-19 declaration under Section 564(b)(1) of the Act, 21 U.S.C.section 360bbb-3(b)(1), unless the authorization is terminated  or revoked sooner.       Influenza A by PCR NEGATIVE NEGATIVE Final   Influenza B by PCR NEGATIVE NEGATIVE Final    Comment: (NOTE) The Xpert Xpress SARS-CoV-2/FLU/RSV plus assay is intended as an aid in the diagnosis of influenza from Nasopharyngeal swab specimens and should not be used as a sole basis for treatment. Nasal washings and aspirates are unacceptable for Xpert Xpress  SARS-CoV-2/FLU/RSV testing.  Fact  Sheet for Patients: EntrepreneurPulse.com.au  Fact Sheet for Healthcare Providers: IncredibleEmployment.be  This test is not yet approved or cleared by the [place] FDA and has been authorized for detection and/or diagnosis of SARS-CoV-2 by FDA under an Emergency Use Authorization (EUA). This EUA will remain in effect (meaning this test can be used) for the duration of the COVID-19 declaration under Section 564(b)(1) of the Act, 21 U.S.C. section 360bbb-3(b)(1), unless the authorization is terminated or revoked.  Performed at [facility], [address] [place], [place] [address]       Scheduled Meds:  arformoterol  15 mcg Nebulization BID   vitamin C  500 mg Oral BID   aspirin  81 mg Oral Daily   atorvastatin  80 mg Oral Daily   budesonide (PULMICORT) nebulizer solution  0.25 mg Nebulization BID   carvedilol  25 mg Oral BID   Chlorhexidine Gluconate Cloth  6 each Topical Q0600   Chlorhexidine Gluconate Cloth  6 each Topical Q0600   epoetin (EPOGEN/PROCRIT) injection  10,000 Units Intravenous Q M,W,F-HD   feeding supplement (NEPRO CARB STEADY)  237 mL Oral TID BM   ferric citrate  420 mg Oral TID WC   gabapentin  400 mg Oral QHS   heparin  5,000 Units Subcutaneous Q8H   hydrALAZINE  100 mg Oral Q8H   insulin aspart  0-5 Units Subcutaneous QHS   insulin aspart  0-9 Units Subcutaneous TID WC   insulin detemir  8 Units Subcutaneous Daily   ipratropium-albuterol  3 mL Nebulization BID   isosorbide dinitrate  10 mg Oral TID   levETIRAcetam  500 mg Oral Daily   lisinopril  40 mg Oral Daily   mouth rinse  15 mL Mouth Rinse q12n4p   multivitamin  1 tablet Oral QHS   sevelamer carbonate  1,600 mg Oral TID WC   Continuous Infusions:     LOS: 6 days    Time spent: 35 minutes    Max Sane, MD [facility] Pager [phone]  If 7PM-7AM, please contact  night-coverage 05/25/2021, 1:58 PM

## 2021-05-25 NOTE — Progress Notes (Signed)
Progress Note  Patient Name: [name] Date of Encounter: 05/25/2021  [facility] Cardiologist: None   Subjective   Seen in HD. Hgb today 7.5. Bps elevated. Patient denies chest pain.   Inpatient Medications    Scheduled Meds:  arformoterol  15 mcg Nebulization BID   vitamin C  500 mg Oral BID   aspirin  81 mg Oral Daily   atorvastatin  80 mg Oral Daily   budesonide (PULMICORT) nebulizer solution  0.25 mg Nebulization BID   carvedilol  25 mg Oral BID   Chlorhexidine Gluconate Cloth  6 each Topical Q0600   Chlorhexidine Gluconate Cloth  6 each Topical Q0600   epoetin (EPOGEN/PROCRIT) injection  10,000 Units Intravenous Q M,W,F-HD   feeding supplement (NEPRO CARB STEADY)  237 mL Oral TID BM   ferric citrate  420 mg Oral TID WC   gabapentin  400 mg Oral QHS   heparin  5,000 Units Subcutaneous Q8H   hydrALAZINE  100 mg Oral Q8H   insulin aspart  0-5 Units Subcutaneous QHS   insulin aspart  0-9 Units Subcutaneous TID WC   insulin detemir  8 Units Subcutaneous Daily   ipratropium-albuterol  3 mL Nebulization BID   isosorbide dinitrate  10 mg Oral TID   levETIRAcetam  500 mg Oral Daily   lisinopril  40 mg Oral Daily   mouth rinse  15 mL Mouth Rinse q12n4p   multivitamin  1 tablet Oral QHS   sevelamer carbonate  1,600 mg Oral TID WC   Continuous Infusions:  PRN Meds: acetaminophen **OR** acetaminophen   Vital Signs    Vitals:   05/25/21 0010 05/25/21 0409 05/25/21 0500 05/25/21 0802  BP: (!) 157/73 (!) 173/75  (!) 161/77  Pulse: 70 72  77  Resp: 16 18  17   Temp: 98.6 F (37 C) 98.4 F (36.9 C)  97.9 F (36.6 C)  TempSrc:  Oral    SpO2: 97% 100%  95%  Weight:   60.9 kg   Height:        Intake/Output Summary (Last 24 hours) at 05/25/2021 0844 Last data filed at 05/24/2021 1300 Gross per 24 hour  Intake 240 ml  Output --  Net 240 ml   Last 3 Weights 05/25/2021 05/24/2021 05/21/2021  Weight (lbs) 134 lb 4.2 oz 130 lb 11.7 oz 133 lb 13.1 oz  Weight (kg) 60.9  kg 59.3 kg 60.7 kg      Telemetry    N/A - Personally Reviewed  ECG    NSR, LVH, 64bpm, PAC, Qtc 560m - Personally Reviewed  Physical Exam   GEN: No acute distress.   Neck: No JVD Cardiac: RRR, no murmurs, rubs, or gallops.  Respiratory: Crackles at bases. GI: Soft, nontender, non-distended  MS: No edema; b/l BKA Neuro:  Nonfocal  Psych: Normal affect   Labs    High Sensitivity Troponin:   Recent Labs  Lab 05/12/21 1928 05/12/21 2103 05/18/21 0143 05/18/21 0336  TROPONINIHS 32* 33* 34* 30*      Chemistry Recent Labs  Lab 05/22/21 0603 05/23/21 0505 05/25/21 0512  NA 134* 135 135  K 4.6 3.8 4.3  CL 95* 98 96*  CO2 28 31 31   GLUCOSE 164* 176* 120*  BUN 59* 38* 60*  CREATININE 6.08* 4.23* 6.45*  CALCIUM 8.0* 8.3* 8.5*  GFRNONAA 10* 15* 9*  ANIONGAP 11 6 8      Hematology Recent Labs  Lab 05/23/21 0505 05/24/21 0619 05/25/21 0512  WBC 8.2 7.5  8.4  RBC 2.94* 2.79* 2.82*  HGB 7.8* 7.5* 7.5*  HCT 25.0* 24.2* 24.4*  MCV 85.0 86.7 86.5  MCH 26.5 26.9 26.6  MCHC 31.2 31.0 30.7  RDW 18.7* 18.8* 19.0*  PLT 133* 141* 158    BNPNo results for input(s): BNP, PROBNP in the last 168 hours.   DDimer  Recent Labs  Lab 05/19/21 1346  DDIMER 1.22*     Radiology    No results found.  Cardiac Studies   2D echo 04/2021: 1. Left ventricular ejection fraction, by estimation, is 30 to 35%. The  left ventricle has moderately decreased function. The left ventricle  demonstrates global hypokinesis. The left ventricular internal cavity size  was mildly dilated. There is mild  left ventricular hypertrophy. Left ventricular diastolic parameters are  consistent with Grade II diastolic dysfunction (pseudonormalization).  Elevated left atrial pressure.   2. Right ventricular systolic function is moderately reduced. The right  ventricular size is moderately enlarged. There is normal pulmonary artery  systolic pressure.   3. Left atrial size was severely  dilated.   4. Large pleural effusion in the left lateral region.   5. The mitral valve is abnormal. Mild to moderate mitral valve  regurgitation. No evidence of mitral stenosis. Moderate mitral annular  calcification.   6. Tricuspid valve regurgitation is moderate.   7. The aortic valve is tricuspid. There is moderate calcification of the  aortic valve. There is moderate thickening of the aortic valve. Aortic  valve regurgitation is not visualized. Mild to moderate aortic valve  sclerosis/calcification is present,  without any evidence of aortic stenosis.   8. The inferior vena cava is normal in size with <50% respiratory  variability, suggesting right atrial pressure of 8 mmHg. __________   2D echo 08/2020: 1. Left ventricular ejection fraction, by estimation, is 40 to 45%. The  left ventricle has mildly decreased function. The left ventricle  demonstrates global hypokinesis. There is moderate concentric left  ventricular hypertrophy. Left ventricular  diastolic parameters are consistent with Grade II diastolic dysfunction  (pseudonormalization). Elevated left atrial pressure.   2. Right ventricular systolic function is mildly reduced. The right  ventricular size is normal. Tricuspid regurgitation signal is inadequate  for assessing PA pressure.   3. Left atrial size was mildly dilated.   4. Right atrial size was mildly dilated.   5. The mitral valve is normal in structure. Trivial mitral valve  regurgitation.   6. The aortic valve is tricuspid. There is mild calcification of the  aortic valve. There is mild thickening of the aortic valve. Aortic valve  regurgitation is not visualized. Mild aortic valve sclerosis is present,  with no evidence of aortic valve  stenosis.   7. Aortic dilatation noted. There is mild dilatation of the ascending  aorta, measuring 40 mm.   8. The inferior vena cava is normal in size with <50% respiratory  variability, suggesting right atrial pressure  of 8 mmHg.  Patient Profile     60 y.o. male with h/o ESRD on HD, MWF, ICA, stroke, DM2, seizure disorder, PAD s/p b/l BKA, DVT, HTN, HLD, anemia of chronic disease, and marijuana use who is being seen for acute HCF.   Assessment & Plan    Acute on chronic HFrEF - Echo this admission showed worsening CM with EF 35% - Prior echo 08/2020 showed EF 45% - Volume management per HD - Continue coreg, hydralazine/isosorbide, lisinopril - continue GDMT as tolerated - May need ischemic work-up, but not a  good candidate for cath given anemia  Left sided pleural effusion - s/p thoracentesis on 8/1 with 1L fluid removal  Anemia of chronic disease - Hgb 5.8 on presentation, improved to 8.3 s/p 1 unit PRBCs - Baseline Hgb around 8 - goal Hgb>8. Hgb today 7.5, transfusion per IM  ESRD on HD - HD M, W, F - per nephrology  HTN - clonidine held for hypotension - continue lisinopril 26m daily, coreg, Isordil - Hydralazine increased to 1058mTID - Bps still elevated, can increase isordil   For questions or updates, please contact [facility] consult [email or web address] for contact info under        Signed, Odin Mariani H Ninfa MeekerPA-C  05/25/2021, 8:44 AM

## 2021-05-25 NOTE — Procedures (Signed)
Notified today that patient is needing a transfer to a dialysis center in [place]. Working on referral, clinic acceptance and insurance verification may take up to three days. Please contact me with dialysis placement concerns.

## 2021-05-25 NOTE — Progress Notes (Signed)
   05/25/21 1030 05/25/21 1045 05/25/21 1100  Vitals  BP (!) 177/81 (!) 160/70 (!) 180/80  MAP (mmHg) 108 97 110  Pulse Rate 74  --   --   ECG Heart Rate 77 76 77  Resp 20 (!) 26 19  During Hemodialysis Assessment  Blood Flow Rate (mL/min) 400 mL/min 400 mL/min 400 mL/min  Arterial Pressure (mmHg) -120 mmHg -120 mmHg -120 mmHg  Venous Pressure (mmHg) 100 mmHg 100 mmHg 100 mmHg  Transmembrane Pressure (mmHg) 60 mmHg 60 mmHg 60 mmHg  Ultrafiltration Rate (mL/min) 670 mL/min 670 mL/min 670 mL/min  Dialysate Flow Rate (mL/min) 500 ml/min 500 ml/min 500 ml/min  Conductivity: Machine  13.9 13.9 13.9  HD Safety Checks Performed Yes Yes Yes  Intra-Hemodialysis Comments Tolerated well Progressing as prescribed Tolerated well    05/25/21 1115 05/25/21 1130 05/25/21 1145  Vitals  BP (!) 194/90 (!) 196/88 (!) 201/87  MAP (mmHg) 120 118 119  Pulse Rate  --   --   --   ECG Heart Rate 78 78 78  Resp (!) 21 (!) 24 (!) 25  During Hemodialysis Assessment  Blood Flow Rate (mL/min) 400 mL/min 400 mL/min 400 mL/min  Arterial Pressure (mmHg) -120 mmHg -120 mmHg -120 mmHg  Venous Pressure (mmHg) 100 mmHg 100 mmHg 100 mmHg  Transmembrane Pressure (mmHg) 60 mmHg 60 mmHg 60 mmHg  Ultrafiltration Rate (mL/min) 670 mL/min 670 mL/min 670 mL/min  Dialysate Flow Rate (mL/min) 500 ml/min 500 ml/min 500 ml/min  Conductivity: Machine  13.9 13.9 13.9  HD Safety Checks Performed Yes Yes Yes  Intra-Hemodialysis Comments Progressing as prescribed Progressing as prescribed Tolerated well

## 2021-05-25 NOTE — Progress Notes (Addendum)
[facility]  ROUNDING NOTE   Subjective:   Patient seen during dialysis   HEMODIALYSIS FLOWSHEET:  Blood Flow Rate (mL/min): 400 mL/min Arterial Pressure (mmHg): -120 mmHg Venous Pressure (mmHg): 100 mmHg Transmembrane Pressure (mmHg): 60 mmHg Ultrafiltration Rate (mL/min): 670 mL/min Dialysate Flow Rate (mL/min): 500 ml/min Conductivity: Machine : 13.9 Conductivity: Machine : 13.9 Dialysis Fluid Bolus: Normal Saline Bolus Amount (mL): 200 mL Dialysate Change: 2K  Patient sitting up in bed No other complaints   Objective:  Vital signs in last 24 hours:  Temp:  [97.9 F (36.6 C)-98.7 F (37.1 C)] 98.6 F (37 C) (08/08 1228) Pulse Rate:  [68-83] 83 (08/08 1228) Resp:  [16-27] 21 (08/08 1228) BP: (138-201)/(70-90) 179/81 (08/08 1228) SpO2:  [95 %-100 %] 99 % (08/08 1228) Weight:  [60.9 kg] 60.9 kg (08/08 0500)  Weight change: 1.6 kg Filed Weights   05/21/21 0500 05/24/21 0500 05/25/21 0500  Weight: 60.7 kg 59.3 kg 60.9 kg    Intake/Output: I/O last 3 completed shifts: In: 240 [P.O.:240] Out: -    Intake/Output this shift:  Total I/O In: -  Out: 1500 [Other:1500]  Physical Exam: General: NAD, laying in bed  Head: Normocephalic, atraumatic. Moist oral mucosal membranes  Eyes: Anicteric  Lungs:  Clear, room air  Heart: Regular rate and rhythm  Abdomen:  Soft, nontender  Extremities:  Bilateral BKA  Neurologic: Alert, and oriented  Skin: No lesions  Access: LUE AVF    Basic Metabolic Panel: Recent Labs  Lab 05/19/21 0405 05/20/21 0437 05/21/21 0522 05/22/21 0603 05/23/21 0505 05/25/21 0512  NA 137 130* 137 134* 135 135  K 5.3* 5.6* 4.1 4.6 3.8 4.3  CL 97* 94* 96* 95* 98 96*  CO2 26 25 30 28 31 31   GLUCOSE 200* 329* 190* 164* 176* 120*  BUN 60* 88* 44* 59* 38* 60*  CREATININE 6.88* 7.81* 4.76* 6.08* 4.23* 6.45*  CALCIUM 8.5* 7.9* 8.2* 8.0* 8.3* 8.5*  PHOS 6.9*  --   --   --   --   --      Liver Function Tests: No results  for input(s): AST, ALT, ALKPHOS, BILITOT, PROT, ALBUMIN in the last 168 hours.  No results for input(s): LIPASE, AMYLASE in the last 168 hours. No results for input(s): AMMONIA in the last 168 hours.  CBC: Recent Labs  Lab 05/21/21 0522 05/22/21 0603 05/23/21 0505 05/24/21 0619 05/25/21 0512  WBC 9.9 8.0 8.2 7.5 8.4  NEUTROABS 7.8* 5.9 5.9 5.3 5.9  HGB 8.8* 8.3* 7.8* 7.5* 7.5*  HCT 28.1* 26.5* 25.0* 24.2* 24.4*  MCV 85.9 84.9 85.0 86.7 86.5  PLT 178 140* 133* 141* 158     Cardiac Enzymes: No results for input(s): CKTOTAL, CKMB, CKMBINDEX, TROPONINI in the last 168 hours.  BNP: Invalid input(s): POCBNP  CBG: Recent Labs  Lab 05/24/21 1121 05/24/21 1615 05/24/21 2034 05/25/21 0759 05/25/21 1230  GLUCAP 237* 170* 109* 125* 110*     Microbiology: Results for orders placed or performed during the hospital encounter of 05/18/21  Resp Panel by RT-PCR (Flu A&B, Covid) Nasopharyngeal Swab     Status: None   Collection Time: 05/18/21  1:43 AM   Specimen: Nasopharyngeal Swab; Nasopharyngeal(NP) swabs in vial transport medium  Result Value Ref Range Status   SARS Coronavirus 2 by RT PCR NEGATIVE NEGATIVE Final    Comment: (NOTE) SARS-CoV-2 target nucleic acids are NOT DETECTED.  The SARS-CoV-2 RNA is generally detectable in upper respiratory specimens during the acute phase of  infection. The lowest concentration of SARS-CoV-2 viral copies this assay can detect is 138 copies/mL. A negative result does not preclude SARS-Cov-2 infection and should not be used as the sole basis for treatment or other patient management decisions. A negative result may occur with  improper specimen collection/handling, submission of specimen other than nasopharyngeal swab, presence of viral mutation(s) within the areas targeted by this assay, and inadequate number of viral copies(<138 copies/mL). A negative result must be combined with clinical observations, patient history, and  epidemiological information. The expected result is Negative.  Fact Sheet for Patients:  EntrepreneurPulse.com.au  Fact Sheet for Healthcare Providers:  IncredibleEmployment.be  This test is no t yet approved or cleared by the [place] FDA and  has been authorized for detection and/or diagnosis of SARS-CoV-2 by FDA under an Emergency Use Authorization (EUA). This EUA will remain  in effect (meaning this test can be used) for the duration of the COVID-19 declaration under Section 564(b)(1) of the Act, 21 U.S.C.section 360bbb-3(b)(1), unless the authorization is terminated  or revoked sooner.       Influenza A by PCR NEGATIVE NEGATIVE Final   Influenza B by PCR NEGATIVE NEGATIVE Final    Comment: (NOTE) The Xpert Xpress SARS-CoV-2/FLU/RSV plus assay is intended as an aid in the diagnosis of influenza from Nasopharyngeal swab specimens and should not be used as a sole basis for treatment. Nasal washings and aspirates are unacceptable for Xpert Xpress SARS-CoV-2/FLU/RSV testing.  Fact Sheet for Patients: EntrepreneurPulse.com.au  Fact Sheet for Healthcare Providers: IncredibleEmployment.be  This test is not yet approved or cleared by the [place] FDA and has been authorized for detection and/or diagnosis of SARS-CoV-2 by FDA under an Emergency Use Authorization (EUA). This EUA will remain in effect (meaning this test can be used) for the duration of the COVID-19 declaration under Section 564(b)(1) of the Act, 21 U.S.C. section 360bbb-3(b)(1), unless the authorization is terminated or revoked.  Performed at [facility], [address] [place], [place] [address]   Blood culture (routine x 2)     Status: None   Collection Time: 05/18/21  1:44 AM   Specimen: BLOOD  Result Value Ref Range Status   Specimen Description BLOOD RIGHT ANTECUBITAL  Final   Special Requests   Final     BOTTLES DRAWN AEROBIC AND ANAEROBIC Blood Culture adequate volume   Culture   Final    NO GROWTH 5 DAYS Performed at [facility], [address] [place], [place] [address]    Report Status 05/23/2021 FINAL  Final  Blood culture (routine x 2)     Status: None   Collection Time: 05/18/21  1:44 AM   Specimen: BLOOD  Result Value Ref Range Status   Specimen Description BLOOD BLOOD RIGHT FOREARM  Final   Special Requests   Final    BOTTLES DRAWN AEROBIC AND ANAEROBIC Blood Culture adequate volume   Culture   Final    NO GROWTH 5 DAYS Performed at [facility], [address] [place], [place] [address]    Report Status 05/23/2021 FINAL  Final  Acid Fast Smear (AFB)     Status: None   Collection Time: 05/18/21 11:25 AM   Specimen: PATH Cytology Pleural fluid  Result Value Ref Range Status   AFB Specimen Processing Concentration  Final   Acid Fast Smear Negative  Final    Comment: (NOTE) Performed At: [facility] [place], [place] [id number] Rush Farmer MD QQ:[id number]  Source (AFB) PLEURAL  Final    Comment: Performed at [facility], [address] [place], [place] [address]  Body fluid culture w Gram Stain     Status: None   Collection Time: 05/18/21 11:25 AM   Specimen: PATH Cytology Pleural fluid  Result Value Ref Range Status   Specimen Description   Final    PLEURAL Performed at [facility], [address], [place], [place] [address]    Special Requests   Final    NONE Performed at [facility], [address] [place], [place] [address]    Gram Stain   Final    RARE WBC PRESENT, PREDOMINANTLY MONONUCLEAR NO ORGANISMS SEEN    Culture   Final    NO GROWTH 3 DAYS Performed at [facility], [place] [address] [place], [place] [address]    Report Status 05/21/2021 FINAL  Final  Respiratory (~20 pathogens) panel by PCR     Status: None   Collection Time: 05/20/21  7:41 AM   Specimen:  Nasopharyngeal Swab; Respiratory  Result Value Ref Range Status   Adenovirus NOT DETECTED NOT DETECTED Final   Coronavirus 229E NOT DETECTED NOT DETECTED Final    Comment: (NOTE) The Coronavirus on the Respiratory Panel, DOES NOT test for the novel  Coronavirus (2019 nCoV)    Coronavirus HKU1 NOT DETECTED NOT DETECTED Final   Coronavirus NL63 NOT DETECTED NOT DETECTED Final   Coronavirus OC43 NOT DETECTED NOT DETECTED Final   Metapneumovirus NOT DETECTED NOT DETECTED Final   Rhinovirus / Enterovirus NOT DETECTED NOT DETECTED Final   Influenza A NOT DETECTED NOT DETECTED Final   Influenza B NOT DETECTED NOT DETECTED Final   Parainfluenza Virus 1 NOT DETECTED NOT DETECTED Final   Parainfluenza Virus 2 NOT DETECTED NOT DETECTED Final   Parainfluenza Virus 3 NOT DETECTED NOT DETECTED Final   Parainfluenza Virus 4 NOT DETECTED NOT DETECTED Final   Respiratory Syncytial Virus NOT DETECTED NOT DETECTED Final   Bordetella pertussis NOT DETECTED NOT DETECTED Final   Bordetella Parapertussis NOT DETECTED NOT DETECTED Final   Chlamydophila pneumoniae NOT DETECTED NOT DETECTED Final   Mycoplasma pneumoniae NOT DETECTED NOT DETECTED Final    Comment: Performed at [facility], [place]. [address], [place],  [address]    Coagulation Studies: No results for input(s): LABPROT, INR in the last 72 hours.  Urinalysis: No results for input(s): COLORURINE, LABSPEC, PHURINE, GLUCOSEU, HGBUR, BILIRUBINUR, KETONESUR, PROTEINUR, UROBILINOGEN, NITRITE, LEUKOCYTESUR in the last 72 hours.  Invalid input(s): APPERANCEUR    Imaging: No results found.   Medications:      arformoterol  15 mcg Nebulization BID   vitamin C  500 mg Oral BID   aspirin  81 mg Oral Daily   atorvastatin  80 mg Oral Daily   budesonide (PULMICORT) nebulizer solution  0.25 mg Nebulization BID   carvedilol  25 mg Oral BID   Chlorhexidine Gluconate Cloth  6 each Topical Q0600   Chlorhexidine Gluconate Cloth  6 each  Topical Q0600   epoetin (EPOGEN/PROCRIT) injection  10,000 Units Intravenous Q M,W,F-HD   feeding supplement (NEPRO CARB STEADY)  237 mL Oral TID BM   ferric citrate  420 mg Oral TID WC   gabapentin  400 mg Oral QHS   heparin  5,000 Units Subcutaneous Q8H   hydrALAZINE  100 mg Oral Q8H   insulin aspart  0-5 Units Subcutaneous QHS   insulin aspart  0-9 Units Subcutaneous TID WC  insulin detemir  8 Units Subcutaneous Daily   ipratropium-albuterol  3 mL Nebulization BID   isosorbide dinitrate  10 mg Oral TID   levETIRAcetam  500 mg Oral Daily   lisinopril  40 mg Oral Daily   mouth rinse  15 mL Mouth Rinse q12n4p   multivitamin  1 tablet Oral QHS   sevelamer carbonate  1,600 mg Oral TID WC   acetaminophen **OR** acetaminophen  Assessment/ Plan:  [name] is a 60 y.o. black male with end stage renal disease on hemodialysis, diabetes mellitus type II, hypertension, CVA, peripheral vascular disease status post bilateral BKA and ESRD on dialysis. Patient presented to ED with shortness of breath and cough. He has been admitted to [facility] for SOB (shortness of breath) [R06.02] Pleural effusion [J90] Healthcare-associated pneumonia [J18.9] Acute respiratory failure with hypoxia ([place]) [J96.01] Acute on chronic anemia [D64.9] Acute decompensated heart failure ([place]) [I50.9]   [facility]/MWF/LUE AVF  End stage renal disease on dialysis  - Dialysis on MWF, Received dialysis today. - UF goal 1.5L achieved today - D/c plan includes SNF placement. Bed offer accepted in [facility] - Dialysis coordinator requesting clinic transfer to [facility] treatment center.  2. Acute respiratory failure: requiring oxygen. Status post left thoracentesis on 8/1. 1 Liter removed.  - Supportive care - Weaned to room air - Nebs as scheduled - Appreciate pulmonology recs   3. Anemia of chronic kidney disease : Lab Results  Component Value Date   HGB 7.5 (L) 05/25/2021  EPO 10000 units with  HD treatments.    4. Secondary Hyperparathyroidism: with hyperphosphatemia Lab Results  Component Value Date   PTH 283 (H) 01/08/2019   CALCIUM 8.5 (L) 05/25/2021   CAION 1.10 (L) 09/12/2020   PHOS 6.9 (H) 05/19/2021  - Phosphorus and calcium not at goal - Auryxia and sevelamer with meals.   5. Hypertension: Current regimen of clonidine, amlodipine, carvedilol, and hydralazine.  BP stable for this patient. 179/81    LOS: 6   8/8/202212:40 PM

## 2021-05-25 NOTE — Progress Notes (Signed)
Occupational Therapy Treatment Patient Details Name: [name] MRN: [id number] DOB: [birth year] Today's Date: 05/25/2021    History of present illness Patient is a 60 y.o. black male with end stage renal disease on hemodialysis, diabetes mellitus type II, hypertension, CVA, peripheral vascular disease status post bilateral BKA and ESRD on dialysis. Patient presented to ED with shortness of breath and cough. Patient had rapid response called while in hemodialysis requiring transfer to ICU. Acute respiratory failure, weaned from BiPAP. Status post left thoracentesis on 8/1. CT chest negative for PE.   OT comments  Chart reviewed, pt greeted in bed agreeable to OT tx session. Pt endorses fatigue. Pt is A&Ox4, follows one step directives with 100% accuracy. Pt appears to be progressing with functional mobility, functional task completion however continues to perform below PLOF. Pt also presents with improved sitting balance with use of prosthetics. Please see additional details below. Pt continues to benefit from skilled OT services to maximize return to PLOF and minimize risk of future falls, injury, caregiver burden, and readmission. Will continue to follow POC. Discharge recommendation remains appropriate.     Follow Up Recommendations  SNF    Equipment Recommendations  Other (comment) (per next venue of care)    Recommendations for Other Services      Precautions / Restrictions Precautions Precautions: Fall Required Braces or Orthoses: Other Brace Other Brace: pt has BLE prosthetics Restrictions Weight Bearing Restrictions: No       Mobility Bed Mobility Overal bed mobility: Needs Assistance Bed Mobility: Supine to Sit;Sit to Supine     Supine to sit: Mod assist Sit to supine: Min assist        Transfers Overall transfer level: Needs assistance Equipment used: Rolling walker (2 wheeled) Transfers: Sit to/from Stand Sit to Stand: Max assist              Balance  Overall balance assessment: Needs assistance Sitting-balance support: No upper extremity supported;Feet supported Sitting balance-Leahy Scale: Fair Sitting balance - Comments: with prosthetics   Standing balance support: No upper extremity supported Standing balance-Leahy Scale: Poor                             ADL either performed or assessed with clinical judgement   ADL Overall ADL's : Needs assistance/impaired     Grooming: Wash/dry hands;Sitting;Wash/dry face;Set up Grooming Details (indicate cue type and reason): at edge of bed             Lower Body Dressing: Moderate assistance Lower Body Dressing Details (indicate cue type and reason): for prosthetics, pants             Functional mobility during ADLs: Maximal assistance;Rolling walker (for STS)                         Cognition Arousal/Alertness: Awake/alert Behavior During Therapy: WFL for tasks assessed/performed                                   General Comments: Pt A&Ox4; follows 1 step directives for ADL task completion with 100% accuracy. Pt provides approrpiate directives on donning/doffing prosthetics                          Pertinent Vitals/ Pain       Pain Assessment: No/denies pain  Frequency  Min 1X/week        Progress Toward Goals  OT Goals(current goals can now be found in the care plan section)  Progress towards OT goals: Progressing toward goals  Acute Rehab OT Goals Patient Stated Goal: to wash his face OT Goal Formulation: With patient Potential to Achieve Goals: Good  Plan Discharge plan remains appropriate;Frequency remains appropriate                     AM-PAC OT "6 Clicks" Daily Activity     Outcome Measure   Help from another person eating meals?: A Little Help from another person taking care of personal grooming?: A Little Help from another person toileting, which includes using toliet, bedpan, or urinal?: A  Lot Help from another person bathing (including washing, rinsing, drying)?: A Lot Help from another person to put on and taking off regular upper body clothing?: A Little Help from another person to put on and taking off regular lower body clothing?: A Lot 6 Click Score: 15    End of Session Equipment Utilized During Treatment: Gait belt;Rolling walker  OT Visit Diagnosis: Unsteadiness on feet (R26.81);Muscle weakness (generalized) (M62.81)   Activity Tolerance Patient tolerated treatment well   Patient Left in bed;with call bell/phone within reach;Other (comment) (PT present at completion of OT tx session)   Nurse Communication          Time: 1440-1500 OT Time Calculation (min): 20 min  Charges: OT General Charges $OT Visit: 1 Visit OT Treatments $Self Care/Home Management : 8-22 mins  Shanon Payor, OTD OTR/L  05/25/21, 3:16 PM

## 2021-05-25 NOTE — Progress Notes (Signed)
Pt tolerated HD without incident, no c/o and stable throughout treatment. UF goal of 1.5L met. Report given to floor nurse

## 2021-05-25 NOTE — Progress Notes (Deleted)
[facility]  ROUNDING NOTE   Subjective:   Patient seen during dialysis   HEMODIALYSIS FLOWSHEET:  Blood Flow Rate (mL/min): 400 mL/min Arterial Pressure (mmHg): -120 mmHg Venous Pressure (mmHg): 100 mmHg Transmembrane Pressure (mmHg): 60 mmHg Ultrafiltration Rate (mL/min): 670 mL/min Dialysate Flow Rate (mL/min): 500 ml/min Conductivity: Machine : 13.9 Conductivity: Machine : 13.9 Dialysis Fluid Bolus: Normal Saline Bolus Amount (mL): 200 mL Dialysate Change: 2K  Patient sitting up in bed No other complaints   Objective:  Vital signs in last 24 hours:  Temp:  [97.9 F (36.6 C)-98.7 F (37.1 C)] 98.6 F (37 C) (08/08 1228) Pulse Rate:  [68-83] 83 (08/08 1228) Resp:  [16-27] 21 (08/08 1228) BP: (138-201)/(70-90) 179/81 (08/08 1228) SpO2:  [95 %-100 %] 99 % (08/08 1228) Weight:  [60.9 kg] 60.9 kg (08/08 0500)  Weight change: 1.6 kg Filed Weights   05/21/21 0500 05/24/21 0500 05/25/21 0500  Weight: 60.7 kg 59.3 kg 60.9 kg    Intake/Output: I/O last 3 completed shifts: In: 240 [P.O.:240] Out: -    Intake/Output this shift:  Total I/O In: -  Out: 1500 [Other:1500]  Physical Exam: General: NAD, laying in bed  Head: Normocephalic, atraumatic. Moist oral mucosal membranes  Eyes: Anicteric  Lungs:  Clear, room air  Heart: Regular rate and rhythm  Abdomen:  Soft, nontender  Extremities:  Bilateral BKA  Neurologic: Alert, and oriented  Skin: No lesions  Access: LUE AVF    Basic Metabolic Panel: Recent Labs  Lab 05/19/21 0405 05/20/21 0437 05/21/21 0522 05/22/21 0603 05/23/21 0505 05/25/21 0512  NA 137 130* 137 134* 135 135  K 5.3* 5.6* 4.1 4.6 3.8 4.3  CL 97* 94* 96* 95* 98 96*  CO2 26 25 30 28 31 31   GLUCOSE 200* 329* 190* 164* 176* 120*  BUN 60* 88* 44* 59* 38* 60*  CREATININE 6.88* 7.81* 4.76* 6.08* 4.23* 6.45*  CALCIUM 8.5* 7.9* 8.2* 8.0* 8.3* 8.5*  PHOS 6.9*  --   --   --   --   --      Liver Function Tests: No results  for input(s): AST, ALT, ALKPHOS, BILITOT, PROT, ALBUMIN in the last 168 hours.  No results for input(s): LIPASE, AMYLASE in the last 168 hours. No results for input(s): AMMONIA in the last 168 hours.  CBC: Recent Labs  Lab 05/21/21 0522 05/22/21 0603 05/23/21 0505 05/24/21 0619 05/25/21 0512  WBC 9.9 8.0 8.2 7.5 8.4  NEUTROABS 7.8* 5.9 5.9 5.3 5.9  HGB 8.8* 8.3* 7.8* 7.5* 7.5*  HCT 28.1* 26.5* 25.0* 24.2* 24.4*  MCV 85.9 84.9 85.0 86.7 86.5  PLT 178 140* 133* 141* 158     Cardiac Enzymes: No results for input(s): CKTOTAL, CKMB, CKMBINDEX, TROPONINI in the last 168 hours.  BNP: Invalid input(s): POCBNP  CBG: Recent Labs  Lab 05/24/21 1121 05/24/21 1615 05/24/21 2034 05/25/21 0759 05/25/21 1230  GLUCAP 237* 170* 109* 125* 110*     Microbiology: Results for orders placed or performed during the hospital encounter of 05/18/21  Resp Panel by RT-PCR (Flu A&B, Covid) Nasopharyngeal Swab     Status: None   Collection Time: 05/18/21  1:43 AM   Specimen: Nasopharyngeal Swab; Nasopharyngeal(NP) swabs in vial transport medium  Result Value Ref Range Status   SARS Coronavirus 2 by RT PCR NEGATIVE NEGATIVE Final    Comment: (NOTE) SARS-CoV-2 target nucleic acids are NOT DETECTED.  The SARS-CoV-2 RNA is generally detectable in upper respiratory specimens during the acute phase of  infection. The lowest concentration of SARS-CoV-2 viral copies this assay can detect is 138 copies/mL. A negative result does not preclude SARS-Cov-2 infection and should not be used as the sole basis for treatment or other patient management decisions. A negative result may occur with  improper specimen collection/handling, submission of specimen other than nasopharyngeal swab, presence of viral mutation(s) within the areas targeted by this assay, and inadequate number of viral copies(<138 copies/mL). A negative result must be combined with clinical observations, patient history, and  epidemiological information. The expected result is Negative.  Fact Sheet for Patients:  EntrepreneurPulse.com.au  Fact Sheet for Healthcare Providers:  IncredibleEmployment.be  This test is no t yet approved or cleared by the [place] FDA and  has been authorized for detection and/or diagnosis of SARS-CoV-2 by FDA under an Emergency Use Authorization (EUA). This EUA will remain  in effect (meaning this test can be used) for the duration of the COVID-19 declaration under Section 564(b)(1) of the Act, 21 U.S.C.section 360bbb-3(b)(1), unless the authorization is terminated  or revoked sooner.       Influenza A by PCR NEGATIVE NEGATIVE Final   Influenza B by PCR NEGATIVE NEGATIVE Final    Comment: (NOTE) The Xpert Xpress SARS-CoV-2/FLU/RSV plus assay is intended as an aid in the diagnosis of influenza from Nasopharyngeal swab specimens and should not be used as a sole basis for treatment. Nasal washings and aspirates are unacceptable for Xpert Xpress SARS-CoV-2/FLU/RSV testing.  Fact Sheet for Patients: EntrepreneurPulse.com.au  Fact Sheet for Healthcare Providers: IncredibleEmployment.be  This test is not yet approved or cleared by the [place] FDA and has been authorized for detection and/or diagnosis of SARS-CoV-2 by FDA under an Emergency Use Authorization (EUA). This EUA will remain in effect (meaning this test can be used) for the duration of the COVID-19 declaration under Section 564(b)(1) of the Act, 21 U.S.C. section 360bbb-3(b)(1), unless the authorization is terminated or revoked.  Performed at [facility], [address] [place], [place] [address]   Blood culture (routine x 2)     Status: None   Collection Time: 05/18/21  1:44 AM   Specimen: BLOOD  Result Value Ref Range Status   Specimen Description BLOOD RIGHT ANTECUBITAL  Final   Special Requests   Final     BOTTLES DRAWN AEROBIC AND ANAEROBIC Blood Culture adequate volume   Culture   Final    NO GROWTH 5 DAYS Performed at [facility], [address], [place], [place] [address]    Report Status 05/23/2021 FINAL  Final  Blood culture (routine x 2)     Status: None   Collection Time: 05/18/21  1:44 AM   Specimen: BLOOD  Result Value Ref Range Status   Specimen Description BLOOD BLOOD RIGHT FOREARM  Final   Special Requests   Final    BOTTLES DRAWN AEROBIC AND ANAEROBIC Blood Culture adequate volume   Culture   Final    NO GROWTH 5 DAYS Performed at [facility], [address] [place], [place] [address]    Report Status 05/23/2021 FINAL  Final  Acid Fast Smear (AFB)     Status: None   Collection Time: 05/18/21 11:25 AM   Specimen: PATH Cytology Pleural fluid  Result Value Ref Range Status   AFB Specimen Processing Concentration  Final   Acid Fast Smear Negative  Final    Comment: (NOTE) Performed At: [facility] [place], [place] [id number] Rush Farmer MD MH:[id number]  Source (AFB) PLEURAL  Final    Comment: Performed at [facility], [address] [place], [place] [address]  Body fluid culture w Gram Stain     Status: None   Collection Time: 05/18/21 11:25 AM   Specimen: PATH Cytology Pleural fluid  Result Value Ref Range Status   Specimen Description   Final    PLEURAL Performed at [facility], [address] [place], [place] [address]    Special Requests   Final    NONE Performed at [facility], [address] [place], [place] [address]    Gram Stain   Final    RARE WBC PRESENT, PREDOMINANTLY MONONUCLEAR NO ORGANISMS SEEN    Culture   Final    NO GROWTH 3 DAYS Performed at [facility], [address], [place], [place] [address]    Report Status 05/21/2021 FINAL  Final  Respiratory (~20 pathogens) panel by PCR     Status: None   Collection Time: 05/20/21  7:41 AM   Specimen:  Nasopharyngeal Swab; Respiratory  Result Value Ref Range Status   Adenovirus NOT DETECTED NOT DETECTED Final   Coronavirus 229E NOT DETECTED NOT DETECTED Final    Comment: (NOTE) The Coronavirus on the Respiratory Panel, DOES NOT test for the novel  Coronavirus (2019 nCoV)    Coronavirus HKU1 NOT DETECTED NOT DETECTED Final   Coronavirus NL63 NOT DETECTED NOT DETECTED Final   Coronavirus OC43 NOT DETECTED NOT DETECTED Final   Metapneumovirus NOT DETECTED NOT DETECTED Final   Rhinovirus / Enterovirus NOT DETECTED NOT DETECTED Final   Influenza A NOT DETECTED NOT DETECTED Final   Influenza B NOT DETECTED NOT DETECTED Final   Parainfluenza Virus 1 NOT DETECTED NOT DETECTED Final   Parainfluenza Virus 2 NOT DETECTED NOT DETECTED Final   Parainfluenza Virus 3 NOT DETECTED NOT DETECTED Final   Parainfluenza Virus 4 NOT DETECTED NOT DETECTED Final   Respiratory Syncytial Virus NOT DETECTED NOT DETECTED Final   Bordetella pertussis NOT DETECTED NOT DETECTED Final   Bordetella Parapertussis NOT DETECTED NOT DETECTED Final   Chlamydophila pneumoniae NOT DETECTED NOT DETECTED Final   Mycoplasma pneumoniae NOT DETECTED NOT DETECTED Final    Comment: Performed at [facility], [place]. [address] [place], [place] [address]    Coagulation Studies: No results for input(s): LABPROT, INR in the last 72 hours.  Urinalysis: No results for input(s): COLORURINE, LABSPEC, PHURINE, GLUCOSEU, HGBUR, BILIRUBINUR, KETONESUR, PROTEINUR, UROBILINOGEN, NITRITE, LEUKOCYTESUR in the last 72 hours.  Invalid input(s): APPERANCEUR    Imaging: No results found.   Medications:      arformoterol  15 mcg Nebulization BID   vitamin C  500 mg Oral BID   aspirin  81 mg Oral Daily   atorvastatin  80 mg Oral Daily   budesonide (PULMICORT) nebulizer solution  0.25 mg Nebulization BID   carvedilol  25 mg Oral BID   Chlorhexidine Gluconate Cloth  6 each Topical Q0600   Chlorhexidine Gluconate Cloth  6 each  Topical Q0600   epoetin (EPOGEN/PROCRIT) injection  10,000 Units Intravenous Q M,W,F-HD   feeding supplement (NEPRO CARB STEADY)  237 mL Oral TID BM   ferric citrate  420 mg Oral TID WC   gabapentin  400 mg Oral QHS   heparin  5,000 Units Subcutaneous Q8H   hydrALAZINE  100 mg Oral Q8H   insulin aspart  0-5 Units Subcutaneous QHS   insulin aspart  0-9 Units Subcutaneous TID WC  insulin detemir  8 Units Subcutaneous Daily   ipratropium-albuterol  3 mL Nebulization BID   isosorbide dinitrate  10 mg Oral TID   levETIRAcetam  500 mg Oral Daily   lisinopril  40 mg Oral Daily   mouth rinse  15 mL Mouth Rinse q12n4p   multivitamin  1 tablet Oral QHS   sevelamer carbonate  1,600 mg Oral TID WC   acetaminophen **OR** acetaminophen  Assessment/ Plan:  [name] is a 60 y.o. black male with end stage renal disease on hemodialysis, diabetes mellitus type II, hypertension, CVA, peripheral vascular disease status post bilateral BKA and ESRD on dialysis. Patient presented to ED with shortness of breath and cough. He has been admitted to [facility] for SOB (shortness of breath) [R06.02] Pleural effusion [J90] Healthcare-associated pneumonia [J18.9] Acute respiratory failure with hypoxia ([place]) [J96.01] Acute on chronic anemia [D64.9] Acute decompensated heart failure ([place]) [I50.9]   [facility]/MWF/LUE AVF  End stage renal disease on dialysis  - Dialysis on MWF, Received dialysis today. - UF goal 1.5L achieved today - D/c plan includes SNF placement. Bed offer accepted in [facility] - Dialysis coordinator requesting clinic transfer to [facility].   2. Acute respiratory failure: requiring oxygen. Status post left thoracentesis on 8/1. 1 Liter removed.  - Supportive care - Weaned to room air - Nebs as scheduled - Appreciate pulmonology recs   3. Anemia of chronic kidney disease : Lab Results  Component Value Date   HGB 7.5 (L) 05/25/2021  EPO 10000 units with HD treatments.     4. Secondary Hyperparathyroidism: with hyperphosphatemia Lab Results  Component Value Date   PTH 283 (H) 01/08/2019   CALCIUM 8.5 (L) 05/25/2021   CAION 1.10 (L) 09/12/2020   PHOS 6.9 (H) 05/19/2021  - Phosphorus and calcium not at goal - Auryxia and sevelamer with meals.   5. Hypertension: Current regimen of clonidine, amlodipine, carvedilol, and hydralazine.  BP stable for this patient. 179/81    LOS: 6   8/8/202212:46 PM

## 2021-05-25 NOTE — TOC Progression Note (Addendum)
Transition of Care [facility]) - Progression Note    Patient Details  Name: [name] MRN: [id number] Date of Birth: [birth year]  Transition of Care [facility] CM/SW Contact  Pete Pelt, RN Phone Number: 05/25/2021, 10:02 AM  Clinical Narrative:   Spoke with patient's wife.  Spouse stated patient was accepted in [facility], but she does not want him placed in [place].  He had COVID vaccine X 2 and one booster in Jan 2022,  Reached out to [place] from [place] from [facility], as patient's wife does not want him to go to [place] or [place].  Awaiting response from facilities.  Addendum:  Update [facility] will not have beds until mid week. Addendum 15:01:  Patient's wife wanted to place patient in [place] to allow for her to visit him, however patient had no bed offers locally.  Wife stated she would accept a bed offer from [place], as she is not able to take patient home and care for him.  Estill Bamberg from dialysis at [facility] working on dialysis placement for [facility] facility.  Expected Discharge Plan: [place] Barriers to Discharge: Continued Medical Work up  Expected Discharge Plan and Services Expected Discharge Plan: [place]   Discharge Planning Services: CM Consult Post Acute Care Choice: [place], Resumption of Svcs/PTA Provider Living arrangements for the past 2 months: Single Family Home                 DME Arranged: N/A DME Agency: NA       HH Arranged: RN, PT, OT, Nurse's Aide [place] Agency: [place] ([facility]) Date HH Agency Contacted: 05/18/21 Time [place]: 1452 Representative spoke with at [place]: [place] ([place]) Interventions    Readmission Risk Interventions Readmission Risk Prevention Plan 02/10/2021 02/08/2021 11/21/2020  Transportation Screening Complete Complete Complete  PCP or Specialist Appt within 5-7 Days - Complete -   Home Care Screening - Complete -  Medication Review (RN CM) - Complete -  HRI or Home Care Consult Complete - -  Palliative Care Screening Complete - -  Medication Review (RN Care Manager) Complete - -  Some recent data might be hidden

## 2021-05-25 NOTE — Progress Notes (Signed)
   05/25/21 0905 05/25/21 0915 05/25/21 0930  Vitals  Temp 98.2 F (36.8 C)  --   --   Temp Source Oral  --   --   BP 138/72  --  (!) 163/77  MAP (mmHg) 91  --  103  BP Location Right Arm  --   --   BP Method Automatic  --   --   Patient Position (if appropriate) Lying  --   --   Pulse Rate 73 76 74  Pulse Rate Source Monitor  --   --   ECG Heart Rate 70 75 73  Resp 20 (!) 23 16  During Hemodialysis Assessment  Blood Flow Rate (mL/min) 400 mL/min 400 mL/min 400 mL/min  Arterial Pressure (mmHg) -120 mmHg -120 mmHg -120 mmHg  Venous Pressure (mmHg) 100 mmHg 100 mmHg 100 mmHg  Transmembrane Pressure (mmHg) 60 mmHg 60 mmHg 60 mmHg  Ultrafiltration Rate (mL/min) 670 mL/min 670 mL/min 670 mL/min  Dialysate Flow Rate (mL/min) 500 ml/min 500 ml/min 500 ml/min  Conductivity: Machine  13.9 13.9 13.9  HD Safety Checks Performed Yes Yes Yes  Dialysis Fluid Bolus Normal Saline  --   --   Bolus Amount (mL) 200 mL  --   --   Intra-Hemodialysis Comments Tx initiated Progressing as prescribed Tolerated well    05/25/21 0945 05/25/21 1000 05/25/21 1015  Vitals  Temp  --   --   --   Temp Source  --   --   --   BP (!) 158/74 (!) 157/78 (!) 177/84  MAP (mmHg) 100 103 111  BP Location  --   --   --   BP Method  --   --   --   Patient Position (if appropriate)  --   --   --   Pulse Rate 72 74 76  Pulse Rate Source  --   --   --   ECG Heart Rate 74 74 77  Resp (!) 25 (!) 27 20  During Hemodialysis Assessment  Blood Flow Rate (mL/min) 400 mL/min 400 mL/min 400 mL/min  Arterial Pressure (mmHg) -120 mmHg -120 mmHg -120 mmHg  Venous Pressure (mmHg) 100 mmHg 100 mmHg 100 mmHg  Transmembrane Pressure (mmHg) 60 mmHg 60 mmHg 60 mmHg  Ultrafiltration Rate (mL/min) 670 mL/min 670 mL/min 670 mL/min  Dialysate Flow Rate (mL/min) 500 ml/min 500 ml/min 500 ml/min  Conductivity: Machine  13.9 13.9 13.9  HD Safety Checks Performed Yes Yes Yes  Dialysis Fluid Bolus  --   --   --   Bolus Amount (mL)  --   --    --   Intra-Hemodialysis Comments Progressing as prescribed Tolerated well Progressing as prescribed

## 2021-05-25 NOTE — Progress Notes (Signed)
Nutrition Follow-up  DOCUMENTATION CODES:  Severe malnutrition in context of chronic illness  INTERVENTION:  Continue current diet as ordered, encourage PO intake Rena-vit and vitamin C po daily  Nepro Shake po TID, each supplement provides 425 kcal and 19 grams protein  NUTRITION DIAGNOSIS:  Severe Malnutrition related to chronic illness (ESRD on HD) as evidenced by severe fat depletion, severe muscle depletion.  GOAL:  Patient will meet greater than or equal to 90% of their needs  MONITOR:  PO intake, Supplement acceptance, I & O's, Labs, Weight trends  REASON FOR ASSESSMENT:  Malnutrition Screening Tool, Consult Assessment of nutrition requirement/status  ASSESSMENT:  60 y/o male with h/o CHF, ESRD on HD, bilateral BKAs, DM, HTN, HLD, hx of CVA, and Crohns presented to ED with SOB. Admitted with similar presentation last week.  Pt in HD at the time of assessment.  Pt moved to ICU 8/1 after rapid response called in HD and pt needing BiPAP. Able to be moved back to floor 8/5. Family and pt met with palliative care, pt would like to remain full code at this time and hopes to regain his strength and go back home. CM working on [employer], will need SNF rehab prior to going home.   Reviewed intake, fair this admission but pt routinely accepting Nepro shakes. Will continue current nutrition interventions at this time to encourage adequate intake. Stable weight this admission.   Average Meal Intake: 8/1-8/8: 42% intake x 5 recorded meals (0-90%)  Nutritionally Relevant Medications: Scheduled Meds:  vitamin C  500 mg Oral BID   atorvastatin  80 mg Oral Daily   feeding supplement (NEPRO CARB STEADY)  237 mL Oral TID BM   ferric citrate  420 mg Oral TID WC   insulin aspart  0-5 Units Subcutaneous QHS   insulin aspart  0-9 Units Subcutaneous TID WC   insulin detemir  8 Units Subcutaneous Daily   multivitamin  1 tablet Oral QHS   sevelamer carbonate  1,600 mg Oral TID WC   Labs  Reviewed: BUN 60, creatinine 6.45 SBG ranges from 109-237 mg/dL over the last 24 hours HgbA1c 6% (8/1)  NUTRITION - FOCUSED PHYSICAL EXAM: Flowsheet Row Most Recent Value  Orbital Region Severe depletion  Upper Arm Region Severe depletion  Thoracic and Lumbar Region Severe depletion  Buccal Region Severe depletion  Temple Region Severe depletion  Clavicle Bone Region Severe depletion  Clavicle and Acromion Bone Region Severe depletion  Scapular Bone Region Severe depletion  Dorsal Hand Severe depletion  Patellar Region Severe depletion  Anterior Thigh Region Severe depletion  Posterior Calf Region Severe depletion  Edema (RD Assessment) None  Hair Reviewed  Eyes Reviewed  Mouth Reviewed  Skin Reviewed  Nails Reviewed   Diet Order:   Diet Order             Diet Carb Modified Fluid consistency: Thin; Room service appropriate? Yes; Fluid restriction: 2000 mL Fluid  Diet effective now                  EDUCATION NEEDS:  No education needs have been identified at this time  Skin:  Skin Assessment: Skin Integrity Issues: Skin Integrity Issues:: Stage II Stage II: sacrum  Last BM:  8/6 - type 2  Height:  Ht Readings from Last 1 Encounters:  05/18/21 5' 11"  (1.803 m)    Weight:  Wt Readings from Last 1 Encounters:  05/25/21 60.9 kg    Ideal Body Weight:  69 kg (  adjusted by 11.8% for bilateral BKA)  BMI:  Body mass index is 21.2 kg/m. (Using 8/8 wt, 60.9kg, adjusted for BKAs)  Estimated Nutritional Needs:  Kcal:  1900-2200 kcal/d Protein:  95-105 g/d Fluid:  UOP+1L/d  Ranell Patrick, RD, LDN Clinical Dietitian Pager on Amion

## 2021-05-26 ENCOUNTER — Inpatient Hospital Stay: Payer: Self-pay | Admitting: Family Medicine

## 2021-05-26 ENCOUNTER — Telehealth: Payer: Self-pay

## 2021-05-26 DIAGNOSIS — I509 Heart failure, unspecified: Secondary | ICD-10-CM | POA: Diagnosis not present

## 2021-05-26 DIAGNOSIS — Z992 Dependence on renal dialysis: Secondary | ICD-10-CM | POA: Diagnosis not present

## 2021-05-26 DIAGNOSIS — I10 Essential (primary) hypertension: Secondary | ICD-10-CM | POA: Diagnosis not present

## 2021-05-26 DIAGNOSIS — N186 End stage renal disease: Secondary | ICD-10-CM | POA: Diagnosis not present

## 2021-05-26 LAB — CBC WITH DIFFERENTIAL/PLATELET
Abs Immature Granulocytes: 0.25 10*3/uL — ABNORMAL HIGH (ref 0.00–0.07)
Basophils Absolute: 0 10*3/uL (ref 0.0–0.1)
Basophils Relative: 0 %
Eosinophils Absolute: 0.2 10*3/uL (ref 0.0–0.5)
Eosinophils Relative: 3 %
HCT: 27.1 % — ABNORMAL LOW (ref 39.0–52.0)
Hemoglobin: 8.4 g/dL — ABNORMAL LOW (ref 13.0–17.0)
Immature Granulocytes: 3 %
Lymphocytes Relative: 15 %
Lymphs Abs: 1.1 10*3/uL (ref 0.7–4.0)
MCH: 26.9 pg (ref 26.0–34.0)
MCHC: 31 g/dL (ref 30.0–36.0)
MCV: 86.9 fL (ref 80.0–100.0)
Monocytes Absolute: 0.7 10*3/uL (ref 0.1–1.0)
Monocytes Relative: 9 %
Neutro Abs: 5.5 10*3/uL (ref 1.7–7.7)
Neutrophils Relative %: 70 %
Platelets: 190 10*3/uL (ref 150–400)
RBC: 3.12 MIL/uL — ABNORMAL LOW (ref 4.22–5.81)
RDW: 19.6 % — ABNORMAL HIGH (ref 11.5–15.5)
WBC: 7.8 10*3/uL (ref 4.0–10.5)
nRBC: 0 % (ref 0.0–0.2)

## 2021-05-26 LAB — GLUCOSE, CAPILLARY
Glucose-Capillary: 120 mg/dL — ABNORMAL HIGH (ref 70–99)
Glucose-Capillary: 142 mg/dL — ABNORMAL HIGH (ref 70–99)
Glucose-Capillary: 137 mg/dL — ABNORMAL HIGH (ref 70–99)

## 2021-05-26 MED ORDER — ISOSORBIDE DINITRATE 20 MG PO TABS: 20.0000 mg | ORAL_TABLET | Freq: Three times a day (TID) | ORAL | 0 refills | Status: DC

## 2021-05-26 NOTE — Progress Notes (Signed)
Physical Therapy Treatment Patient Details Name: [name] MRN: [id number] DOB: 1960-12-22 Today's Date: 05/26/2021    History of Present Illness Patient is a 60 y.o. black male with end stage renal disease on hemodialysis, diabetes mellitus type II, hypertension, CVA, peripheral vascular disease status post bilateral BKA and ESRD on dialysis. Patient presented to ED with shortness of breath and cough. Patient had rapid response called while in hemodialysis requiring transfer to ICU. Acute respiratory failure, weaned from BiPAP. Status post left thoracentesis on 8/1. CT chest negative for PE.    PT Comments    Patient is motivated to participate with PT and wants to get OOB to chair. Patient is making progress towards meeting PT goals with increased independence with transfers this session. Patient donned prosthesis with set-up from therapist with good dynamic sitting balance demonstrated. Using transfer board and bilateral prosthesis, patient performed incremental lateral transfer from bed to recliner chair with moderate assistance of one person. Recommend to continue with PT to maximize independence and decrease caregiver burden. SNF recommended at discharge.    Follow Up Recommendations  SNF     Equipment Recommendations  None recommended by PT    Recommendations for Other Services       Precautions / Restrictions Precautions Precautions: Fall Other Brace: pt has BLE prosthetics Restrictions Weight Bearing Restrictions: No    Mobility  Bed Mobility Overal bed mobility: Needs Assistance             General bed mobility comments: patient able to shift hips from long sitting to short sitting on edge of bed with supervision. patient donned prosthesis with set-up    Transfers Overall transfer level: Needs assistance Equipment used: Sliding board Transfers: Lateral/Scoot Transfers          Lateral/Scoot Transfers: Mod assist General transfer comment: patient able  to get from bed to recliner using transfer board and moderate assistance from therapist with verbal cues for technique  to maintain skin integrity and to avoid sliding on board. patient demonstrated correct technique by clearing buttocks from board incrementally using BLE prothesis and BUE support  Ambulation/Gait                 Stairs             Wheelchair Mobility    Modified Rankin (Stroke Patients Only)       Balance Overall balance assessment: Needs assistance Sitting-balance support: Feet supported Sitting balance-Leahy Scale: Good Sitting balance - Comments: with prosthetics at edge of chair targeting dynamic sitting balance                                    Cognition Arousal/Alertness: Awake/alert Behavior During Therapy: WFL for tasks assessed/performed Overall Cognitive Status: Within Functional Limits for tasks assessed                                 General Comments: Pt A&Ox4; follows 1-2  step directives for ADL task completion with 100% accuracy.      Exercises      General Comments        Pertinent Vitals/Pain Pain Assessment: No/denies pain    Home Living                      Prior Function  PT Goals (current goals can now be found in the care plan section) Acute Rehab PT Goals Patient Stated Goal: to have condom cath removed PT Goal Formulation: With patient Time For Goal Achievement: 06/04/21 Potential to Achieve Goals: Good Progress towards PT goals: Progressing toward goals    Frequency    Min 2X/week      PT Plan Current plan remains appropriate    Co-evaluation              AM-PAC PT "6 Clicks" Mobility   Outcome Measure  Help needed turning from your back to your side while in a flat bed without using bedrails?: None Help needed moving from lying on your back to sitting on the side of a flat bed without using bedrails?: A Little Help needed moving to and  from a bed to a chair (including a wheelchair)?: A Little Help needed standing up from a chair using your arms (e.g., wheelchair or bedside chair)?: A Lot Help needed to walk in hospital room?: Total Help needed climbing 3-5 steps with a railing? : Total 6 Click Score: 14    End of Session Equipment Utilized During Treatment: Gait belt Activity Tolerance: Patient tolerated treatment well Patient left: in chair;with call bell/phone within reach;with chair alarm set Nurse Communication: Mobility status PT Visit Diagnosis: Muscle weakness (generalized) (M62.81);Other abnormalities of gait and mobility (R26.89)     Time: 0930-1000 PT Time Calculation (min) (ACUTE ONLY): 30 min  Charges:  $Therapeutic Activity: 23-37 mins                    Minna Merritts, PT, MPT    Percell Locus 05/26/2021, 12:36 PM

## 2021-05-26 NOTE — TOC Transition Note (Signed)
Transition of Care [facility]) - CM/SW Discharge Note   Patient Details  Name: [name] MRN: [id number] Date of Birth: 14-Feb-1961  Transition of [facility]) CM/SW Contact:  Pete Pelt, RN Phone Number: 05/26/2021, 2:09 PM   Clinical Narrative:   Patient has been accepted for placement at [facility].  Wife stated that she agrees to white [employer], as she would like to have her husband close by, in [place], thus she accepted the bed.  Wife aware that patient will transport by [facility] at 4pm today.  She states she will meet him at the facility.  As per Hilda Blades at [facility], the facility will accept patient and they will be able to accommodate transportation to [place] on Monday, Wednesday and Friday at [place] team aware of transport.      Barriers to Discharge: Continued Medical Work up   Patient Goals and CMS Choice Patient states their goals for this hospitalization and ongoing recovery are:: Patient wants to get better and get back home CMS Medicare.gov Compare Post Acute Care list provided to:: Patient Choice offered to / list presented to : Patient, Spouse  Discharge Placement                       Discharge Plan and Services   Discharge Planning Services: CM Consult Post Acute Care Choice: Home Health, Resumption of Svcs/PTA Provider          DME Arranged: N/A DME Agency: NA       HH Arranged: RN, PT, OT, Nurse's Aide [place] Agency: [place] ([facility]) Date HH Agency Contacted: 05/18/21 Time [place]: 1452 Representative spoke with at [place]: [place] ([place]) Interventions     Readmission Risk Interventions Readmission Risk Prevention Plan 02/10/2021 02/08/2021 11/21/2020  Transportation Screening Complete Complete Complete  PCP or Specialist Appt within 5-7 Days - Complete -  Home Care Screening - Complete -  Medication Review (RN CM) - Complete  -  HRI or Home Care Consult Complete - -  Palliative Care Screening Complete - -  Medication Review (RN Care Manager) Complete - -  Some recent data might be hidden

## 2021-05-26 NOTE — Progress Notes (Addendum)
[facility]  ROUNDING NOTE   Subjective:   Patient seen sitting at side of bed Eating breakfast Denies nausea Denies shortness of breath   Objective:  Vital signs in last 24 hours:  Temp:  [97.9 F (36.6 C)-98.8 F (37.1 C)] 98.3 F (36.8 C) (08/09 1106) Pulse Rate:  [72-83] 72 (08/09 1106) Resp:  [16-27] 16 (08/09 1106) BP: (149-195)/(71-87) 149/71 (08/09 1106) SpO2:  [97 %-100 %] 99 % (08/09 1106) Weight:  [60.1 kg] 60.1 kg (08/09 0500)  Weight change: -0.8 kg Filed Weights   05/24/21 0500 05/25/21 0500 05/26/21 0500  Weight: 59.3 kg 60.9 kg 60.1 kg    Intake/Output: I/O last 3 completed shifts: In: -  Out: 1500 [Other:1500]   Intake/Output this shift:  No intake/output data recorded.  Physical Exam: General: NAD, sitting at bedside  Head: Normocephalic, atraumatic. Moist oral mucosal membranes  Eyes: Anicteric  Lungs:  Clear, room air  Heart: Regular rate and rhythm  Abdomen:  Soft, nontender  Extremities:  Bilateral BKA  Neurologic: Alert, and oriented  Skin: No lesions  Access: LUE AVF    Basic Metabolic Panel: Recent Labs  Lab 05/20/21 0437 05/21/21 0522 05/22/21 0603 05/23/21 0505 05/25/21 0512  NA 130* 137 134* 135 135  K 5.6* 4.1 4.6 3.8 4.3  CL 94* 96* 95* 98 96*  CO2 25 30 28 31 31   GLUCOSE 329* 190* 164* 176* 120*  BUN 88* 44* 59* 38* 60*  CREATININE 7.81* 4.76* 6.08* 4.23* 6.45*  CALCIUM 7.9* 8.2* 8.0* 8.3* 8.5*     Liver Function Tests: No results for input(s): AST, ALT, ALKPHOS, BILITOT, PROT, ALBUMIN in the last 168 hours.  No results for input(s): LIPASE, AMYLASE in the last 168 hours. No results for input(s): AMMONIA in the last 168 hours.  CBC: Recent Labs  Lab 05/22/21 0603 05/23/21 0505 05/24/21 0619 05/25/21 0512 05/26/21 0610  WBC 8.0 8.2 7.5 8.4 7.8  NEUTROABS 5.9 5.9 5.3 5.9 5.5  HGB 8.3* 7.8* 7.5* 7.5* 8.4*  HCT 26.5* 25.0* 24.2* 24.4* 27.1*  MCV 84.9 85.0 86.7 86.5 86.9  PLT 140* 133* 141*  158 190     Cardiac Enzymes: No results for input(s): CKTOTAL, CKMB, CKMBINDEX, TROPONINI in the last 168 hours.  BNP: Invalid input(s): POCBNP  CBG: Recent Labs  Lab 05/25/21 1607 05/25/21 2105 05/26/21 0510 05/26/21 0737 05/26/21 1108  GLUCAP 70 127* 120* 142* 137*     Microbiology: Results for orders placed or performed during the hospital encounter of 05/18/21  Resp Panel by RT-PCR (Flu A&B, Covid) Nasopharyngeal Swab     Status: None   Collection Time: 05/18/21  1:43 AM   Specimen: Nasopharyngeal Swab; Nasopharyngeal(NP) swabs in vial transport medium  Result Value Ref Range Status   SARS Coronavirus 2 by RT PCR NEGATIVE NEGATIVE Final    Comment: (NOTE) SARS-CoV-2 target nucleic acids are NOT DETECTED.  The SARS-CoV-2 RNA is generally detectable in upper respiratory specimens during the acute phase of infection. The lowest concentration of SARS-CoV-2 viral copies this assay can detect is 138 copies/mL. A negative result does not preclude SARS-Cov-2 infection and should not be used as the sole basis for treatment or other patient management decisions. A negative result may occur with  improper specimen collection/handling, submission of specimen other than nasopharyngeal swab, presence of viral mutation(s) within the areas targeted by this assay, and inadequate number of viral copies(<138 copies/mL). A negative result must be combined with clinical observations, patient history, and epidemiological information.  The expected result is Negative.  Fact Sheet for Patients:  EntrepreneurPulse.com.au  Fact Sheet for Healthcare Providers:  IncredibleEmployment.be  This test is no t yet approved or cleared by the [place] FDA and  has been authorized for detection and/or diagnosis of SARS-CoV-2 by FDA under an Emergency Use Authorization (EUA). This EUA will remain  in effect (meaning this test can be used) for the duration of  the COVID-19 declaration under Section 564(b)(1) of the Act, 21 U.S.C.section 360bbb-3(b)(1), unless the authorization is terminated  or revoked sooner.       Influenza A by PCR NEGATIVE NEGATIVE Final   Influenza B by PCR NEGATIVE NEGATIVE Final    Comment: (NOTE) The Xpert Xpress SARS-CoV-2/FLU/RSV plus assay is intended as an aid in the diagnosis of influenza from Nasopharyngeal swab specimens and should not be used as a sole basis for treatment. Nasal washings and aspirates are unacceptable for Xpert Xpress SARS-CoV-2/FLU/RSV testing.  Fact Sheet for Patients: EntrepreneurPulse.com.au  Fact Sheet for Healthcare Providers: IncredibleEmployment.be  This test is not yet approved or cleared by the [place] FDA and has been authorized for detection and/or diagnosis of SARS-CoV-2 by FDA under an Emergency Use Authorization (EUA). This EUA will remain in effect (meaning this test can be used) for the duration of the COVID-19 declaration under Section 564(b)(1) of the Act, 21 U.S.C. section 360bbb-3(b)(1), unless the authorization is terminated or revoked.  Performed at [facility], [address] [place], [place] [address]   Blood culture (routine x 2)     Status: None   Collection Time: 05/18/21  1:44 AM   Specimen: BLOOD  Result Value Ref Range Status   Specimen Description BLOOD RIGHT ANTECUBITAL  Final   Special Requests   Final    BOTTLES DRAWN AEROBIC AND ANAEROBIC Blood Culture adequate volume   Culture   Final    NO GROWTH 5 DAYS Performed at [facility], [address], [place], [place] [address]    Report Status 05/23/2021 FINAL  Final  Blood culture (routine x 2)     Status: None   Collection Time: 05/18/21  1:44 AM   Specimen: BLOOD  Result Value Ref Range Status   Specimen Description BLOOD BLOOD RIGHT FOREARM  Final   Special Requests   Final    BOTTLES DRAWN AEROBIC AND ANAEROBIC Blood  Culture adequate volume   Culture   Final    NO GROWTH 5 DAYS Performed at [facility], [address] [place], [place] [address]    Report Status 05/23/2021 FINAL  Final  Acid Fast Smear (AFB)     Status: None   Collection Time: 05/18/21 11:25 AM   Specimen: PATH Cytology Pleural fluid  Result Value Ref Range Status   AFB Specimen Processing Concentration  Final   Acid Fast Smear Negative  Final    Comment: (NOTE) Performed At: [facility] [place], [place] [id number] Rush Farmer MD QQ:[id number]    Source (AFB) PLEURAL  Final    Comment: Performed at [facility], [address] [place], [place] [address]  Body fluid culture w Gram Stain     Status: None   Collection Time: 05/18/21 11:25 AM   Specimen: PATH Cytology Pleural fluid  Result Value Ref Range Status   Specimen Description   Final    PLEURAL Performed at [facility], [address], [place], [place] [address]    Special Requests   Final  NONE Performed at [facility], [address] [place], [place] [address]    Gram Stain   Final    RARE WBC PRESENT, PREDOMINANTLY MONONUCLEAR NO ORGANISMS SEEN    Culture   Final    NO GROWTH 3 DAYS Performed at [facility] [address], [place], [place] [address]    Report Status 05/21/2021 FINAL  Final  Respiratory (~20 pathogens) panel by PCR     Status: None   Collection Time: 05/20/21  7:41 AM   Specimen: Nasopharyngeal Swab; Respiratory  Result Value Ref Range Status   Adenovirus NOT DETECTED NOT DETECTED Final   Coronavirus 229E NOT DETECTED NOT DETECTED Final    Comment: (NOTE) The Coronavirus on the Respiratory Panel, DOES NOT test for the novel  Coronavirus (2019 nCoV)    Coronavirus HKU1 NOT DETECTED NOT DETECTED Final   Coronavirus NL63 NOT DETECTED NOT DETECTED Final   Coronavirus OC43 NOT DETECTED NOT DETECTED Final   Metapneumovirus NOT DETECTED NOT DETECTED Final    Rhinovirus / Enterovirus NOT DETECTED NOT DETECTED Final   Influenza A NOT DETECTED NOT DETECTED Final   Influenza B NOT DETECTED NOT DETECTED Final   Parainfluenza Virus 1 NOT DETECTED NOT DETECTED Final   Parainfluenza Virus 2 NOT DETECTED NOT DETECTED Final   Parainfluenza Virus 3 NOT DETECTED NOT DETECTED Final   Parainfluenza Virus 4 NOT DETECTED NOT DETECTED Final   Respiratory Syncytial Virus NOT DETECTED NOT DETECTED Final   Bordetella pertussis NOT DETECTED NOT DETECTED Final   Bordetella Parapertussis NOT DETECTED NOT DETECTED Final   Chlamydophila pneumoniae NOT DETECTED NOT DETECTED Final   Mycoplasma pneumoniae NOT DETECTED NOT DETECTED Final    Comment: Performed at [facility], [place]. [address], [place],  [address]  Resp Panel by RT-PCR (Flu A&B, Covid) Nasopharyngeal Swab     Status: None   Collection Time: 05/25/21 12:37 PM   Specimen: Nasopharyngeal Swab; Nasopharyngeal(NP) swabs in vial transport medium  Result Value Ref Range Status   SARS Coronavirus 2 by RT PCR NEGATIVE NEGATIVE Final    Comment: (NOTE) SARS-CoV-2 target nucleic acids are NOT DETECTED.  The SARS-CoV-2 RNA is generally detectable in upper respiratory specimens during the acute phase of infection. The lowest concentration of SARS-CoV-2 viral copies this assay can detect is 138 copies/mL. A negative result does not preclude SARS-Cov-2 infection and should not be used as the sole basis for treatment or other patient management decisions. A negative result may occur with  improper specimen collection/handling, submission of specimen other than nasopharyngeal swab, presence of viral mutation(s) within the areas targeted by this assay, and inadequate number of viral copies(<138 copies/mL). A negative result must be combined with clinical observations, patient history, and epidemiological information. The expected result is Negative.  Fact Sheet for Patients:   EntrepreneurPulse.com.au  Fact Sheet for Healthcare Providers:  IncredibleEmployment.be  This test is no t yet approved or cleared by the [place] FDA and  has been authorized for detection and/or diagnosis of SARS-CoV-2 by FDA under an Emergency Use Authorization (EUA). This EUA will remain  in effect (meaning this test can be used) for the duration of the COVID-19 declaration under Section 564(b)(1) of the Act, 21 U.S.C.section 360bbb-3(b)(1), unless the authorization is terminated  or revoked sooner.       Influenza A by PCR NEGATIVE NEGATIVE Final   Influenza B by PCR NEGATIVE NEGATIVE Final    Comment: (NOTE) The Xpert Xpress SARS-CoV-2/FLU/RSV plus assay  is intended as an aid in the diagnosis of influenza from Nasopharyngeal swab specimens and should not be used as a sole basis for treatment. Nasal washings and aspirates are unacceptable for Xpert Xpress SARS-CoV-2/FLU/RSV testing.  Fact Sheet for Patients: EntrepreneurPulse.com.au  Fact Sheet for Healthcare Providers: IncredibleEmployment.be  This test is not yet approved or cleared by the [place] FDA and has been authorized for detection and/or diagnosis of SARS-CoV-2 by FDA under an Emergency Use Authorization (EUA). This EUA will remain in effect (meaning this test can be used) for the duration of the COVID-19 declaration under Section 564(b)(1) of the Act, 21 U.S.C. section 360bbb-3(b)(1), unless the authorization is terminated or revoked.  Performed at [facility], [address] [place], [place] [address]     Coagulation Studies: No results for input(s): LABPROT, INR in the last 72 hours.  Urinalysis: No results for input(s): COLORURINE, LABSPEC, PHURINE, GLUCOSEU, HGBUR, BILIRUBINUR, KETONESUR, PROTEINUR, UROBILINOGEN, NITRITE, LEUKOCYTESUR in the last 72 hours.  Invalid input(s): APPERANCEUR    Imaging: No  results found.   Medications:      arformoterol  15 mcg Nebulization BID   vitamin C  500 mg Oral BID   aspirin  81 mg Oral Daily   atorvastatin  80 mg Oral Daily   budesonide (PULMICORT) nebulizer solution  0.25 mg Nebulization BID   carvedilol  25 mg Oral BID   Chlorhexidine Gluconate Cloth  6 each Topical Q0600   Chlorhexidine Gluconate Cloth  6 each Topical Q0600   epoetin (EPOGEN/PROCRIT) injection  10,000 Units Intravenous Q M,W,F-HD   feeding supplement (NEPRO CARB STEADY)  237 mL Oral TID BM   gabapentin  400 mg Oral QHS   heparin  5,000 Units Subcutaneous Q8H   hydrALAZINE  100 mg Oral Q8H   insulin aspart  0-5 Units Subcutaneous QHS   insulin aspart  0-9 Units Subcutaneous TID WC   insulin detemir  8 Units Subcutaneous Daily   ipratropium-albuterol  3 mL Nebulization BID   isosorbide dinitrate  20 mg Oral TID   levETIRAcetam  500 mg Oral Daily   lisinopril  40 mg Oral Daily   mouth rinse  15 mL Mouth Rinse q12n4p   multivitamin  1 tablet Oral QHS   sevelamer carbonate  1,600 mg Oral TID WC   acetaminophen **OR** acetaminophen  Assessment/ Plan:  [name] is a 60 y.o. black male with end stage renal disease on hemodialysis, diabetes mellitus type II, hypertension, CVA, peripheral vascular disease status post bilateral BKA and ESRD on dialysis. Patient presented to ED with shortness of breath and cough. He has been admitted to [facility] for SOB (shortness of breath) [R06.02] Pleural effusion [J90] Healthcare-associated pneumonia [J18.9] Acute respiratory failure with hypoxia ([place]) [J96.01] Acute on chronic anemia [D64.9] Acute decompensated heart failure ([place]) [I50.9]   [facility]/MWF/LUE AVF  End stage renal disease on dialysis  - Dialysis on MWF, Received dialysis yesterday. - UF goal 1.5L achieved - D/c plan includes SNF placement. Bed accepted [facility] - Dialysis coordinator will notify outpatient of discharge.   2. Acute  respiratory failure: requiring oxygen. Status post left thoracentesis on 8/1. 1 Liter removed.  - Supportive care - Nebs as scheduled, room air - Appreciate pulmonology recs   3. Anemia of chronic kidney disease : Lab Results  Component Value Date   HGB 8.4 (L) 05/26/2021  EPO 10000 units with HD treatments.    4. Secondary Hyperparathyroidism: with hyperphosphatemia Lab  Results  Component Value Date   PTH 283 (H) 01/08/2019   CALCIUM 8.5 (L) 05/25/2021   CAION 1.10 (L) 09/12/2020   PHOS 6.9 (H) 05/19/2021  - Phosphorus and calcium not at goal - Auryxia disontinued, continue sevelamer with meals.   5. Hypertension: Current regimen of clonidine, amlodipine, carvedilol, and hydralazine.  BP stable for this patient. 149/71    LOS: 7   8/9/202211:56 AM

## 2021-05-26 NOTE — Progress Notes (Signed)
PROGRESS NOTE    [name]  ZOX:[id number] DOB: December 21, 1960 DOA: 05/18/2021 PCP: [name], MD   Assessment & Plan:   Principal Problem:   Acute respiratory failure with hypoxia ([place]) Active Problems:   Hypertension   Anemia due to chronic kidney disease   Crohn disease ([place])   Hidradenitis suppurativa   Type 2 diabetes mellitus with kidney complication, with long-term current use of insulin (HCC)   End stage renal failure on dialysis [facility])   History of CVA (cerebrovascular accident)   Seizure disorder ([place])   S/P bilateral BKA (below knee amputation) ([place])   Acute on chronic anemia   Chronic systolic CHF (congestive heart failure) ([place])   Long term current use of immunosuppressive drug   Acute decompensated heart failure (HCC)   Unstageable pressure ulcer of sacral region ([facility])   Acute hypoxic respiratory failure: present on admission. RRT called during dialysis on 8/1 for worsening hypoxia requiring transfer to stepdown for BiPAP. S/p thoracentesis on 8/1 with removal of 1 L of fluid, transudative. CTA chest neg for PE on 8/3. Resolved    ACD: likely secondary to ESRD. S/p 1 unit of pRBCs on 8/3. H&H are trending up today  CAP: completed abx course. Encourage incentive spirometry   Acute on chronic decompensated combined CHF: echo showing EF of 30 to 35%.  Ischemic work-up not planned by cardiology due to severe anemia.  Medical management recommended. Clonidine d/c by cardio. Continue on coreg, lisinopril, hydralazine, & isosorbide.   HTN: uncontrolled. Continue on coreg, isosorbide, lisinopril, & hydralazine    ESRD: on HD. HD as per nephro    HLD: continue on statin    DM2: continue on levemir, SSI w/ accuchecks    Seizure disorder: continue on keppra.    History of hidradenitis: continue to hold humira   PAD: s/p b/l BKA. Continue w/ supportive care   Severe protein-calorie malnutrition: BMI 18.48. Continue on nutritional supplements       DVT  prophylaxis: heparin  Code Status: full  Family Communication:  Disposition Plan: d/c to SNF  Level of care: Med-Surg Status is: Inpatient  Remains inpatient appropriate because:Unsafe d/c plan, waiting on HD chair near SNF before pt can be d/c to SNF   Dispo: The patient is from: Home              Anticipated d/c is to: SNF              Patient currently is medically stable to d/c.   Difficult to place patient No    Consultants:  neprho  Procedures:   Antimicrobials:    Subjective: Pt c/o malaise   Objective: Vitals:   05/25/21 2103 05/26/21 0029 05/26/21 0413 05/26/21 0500  BP: (!) 186/84 (!) 154/78 (!) 181/78   Pulse: 80 73 74   Resp: 18 16 16    Temp: 98.8 F (37.1 C) 98.3 F (36.8 C) 98.3 F (36.8 C)   TempSrc: Oral     SpO2: 97% 98% 97%   Weight:    60.1 kg  Height:        Intake/Output Summary (Last 24 hours) at 05/26/2021 0728 Last data filed at 05/25/2021 1200 Gross per 24 hour  Intake --  Output 1500 ml  Net -1500 ml   Filed Weights   05/24/21 0500 05/25/21 0500 05/26/21 0500  Weight: 59.3 kg 60.9 kg 60.1 kg    Examination:  General exam: Appears calm and comfortable  Respiratory system: diminished breath sounds  b/l otherwise clear  Cardiovascular system: S1 & S2 +. No rubs, gallops or clicks.  Gastrointestinal system: Abdomen is nondistended, soft and nontender. Hypoactive bowel sounds heard. Central nervous system: Alert and oriented. Moves all extremities  Psychiatry: Judgement and insight appear normal. Flat mood and affect    Data Reviewed: I have personally reviewed following labs and imaging studies  CBC: Recent Labs  Lab 05/22/21 0603 05/23/21 0505 05/24/21 0619 05/25/21 0512 05/26/21 0610  WBC 8.0 8.2 7.5 8.4 7.8  NEUTROABS 5.9 5.9 5.3 5.9 5.5  HGB 8.3* 7.8* 7.5* 7.5* 8.4*  HCT 26.5* 25.0* 24.2* 24.4* 27.1*  MCV 84.9 85.0 86.7 86.5 86.9  PLT 140* 133* 141* 158 267   Basic Metabolic Panel: Recent Labs  Lab  05/20/21 0437 05/21/21 0522 05/22/21 0603 05/23/21 0505 05/25/21 0512  NA 130* 137 134* 135 135  K 5.6* 4.1 4.6 3.8 4.3  CL 94* 96* 95* 98 96*  CO2 25 30 28 31 31   GLUCOSE 329* 190* 164* 176* 120*  BUN 88* 44* 59* 38* 60*  CREATININE 7.81* 4.76* 6.08* 4.23* 6.45*  CALCIUM 7.9* 8.2* 8.0* 8.3* 8.5*   GFR: Estimated Creatinine Clearance: 10.6 mL/min (A) (by C-G formula based on SCr of 6.45 mg/dL (H)). Liver Function Tests: No results for input(s): AST, ALT, ALKPHOS, BILITOT, PROT, ALBUMIN in the last 168 hours. No results for input(s): LIPASE, AMYLASE in the last 168 hours. No results for input(s): AMMONIA in the last 168 hours. Coagulation Profile: No results for input(s): INR, PROTIME in the last 168 hours. Cardiac Enzymes: No results for input(s): CKTOTAL, CKMB, CKMBINDEX, TROPONINI in the last 168 hours. BNP (last 3 results) No results for input(s): PROBNP in the last 8760 hours. HbA1C: No results for input(s): HGBA1C in the last 72 hours. CBG: Recent Labs  Lab 05/25/21 0759 05/25/21 1230 05/25/21 1607 05/25/21 2105 05/26/21 0510  GLUCAP 125* 110* 70 127* 120*   Lipid Profile: No results for input(s): CHOL, HDL, LDLCALC, TRIG, CHOLHDL, LDLDIRECT in the last 72 hours. Thyroid Function Tests: No results for input(s): TSH, T4TOTAL, FREET4, T3FREE, THYROIDAB in the last 72 hours. Anemia Panel: No results for input(s): VITAMINB12, FOLATE, FERRITIN, TIBC, IRON, RETICCTPCT in the last 72 hours. Sepsis Labs: No results for input(s): PROCALCITON, LATICACIDVEN in the last 168 hours.  Recent Results (from the past 240 hour(s))  Resp Panel by RT-PCR (Flu A&B, Covid) Nasopharyngeal Swab     Status: None   Collection Time: 05/18/21  1:43 AM   Specimen: Nasopharyngeal Swab; Nasopharyngeal(NP) swabs in vial transport medium  Result Value Ref Range Status   SARS Coronavirus 2 by RT PCR NEGATIVE NEGATIVE Final    Comment: (NOTE) SARS-CoV-2 target nucleic acids are NOT  DETECTED.  The SARS-CoV-2 RNA is generally detectable in upper respiratory specimens during the acute phase of infection. The lowest concentration of SARS-CoV-2 viral copies this assay can detect is 138 copies/mL. A negative result does not preclude SARS-Cov-2 infection and should not be used as the sole basis for treatment or other patient management decisions. A negative result may occur with  improper specimen collection/handling, submission of specimen other than nasopharyngeal swab, presence of viral mutation(s) within the areas targeted by this assay, and inadequate number of viral copies(<138 copies/mL). A negative result must be combined with clinical observations, patient history, and epidemiological information. The expected result is Negative.  Fact Sheet for Patients:  EntrepreneurPulse.com.au  Fact Sheet for Healthcare Providers:  IncredibleEmployment.be  This test is no t yet approved  or cleared by the [place] and  has been authorized for detection and/or diagnosis of SARS-CoV-2 by FDA under an Emergency Use Authorization (EUA). This EUA will remain  in effect (meaning this test can be used) for the duration of the COVID-19 declaration under Section 564(b)(1) of the Act, 21 U.S.C.section 360bbb-3(b)(1), unless the authorization is terminated  or revoked sooner.       Influenza A by PCR NEGATIVE NEGATIVE Final   Influenza B by PCR NEGATIVE NEGATIVE Final    Comment: (NOTE) The Xpert Xpress SARS-CoV-2/FLU/RSV plus assay is intended as an aid in the diagnosis of influenza from Nasopharyngeal swab specimens and should not be used as a sole basis for treatment. Nasal washings and aspirates are unacceptable for Xpert Xpress SARS-CoV-2/FLU/RSV testing.  Fact Sheet for Patients: EntrepreneurPulse.com.au  Fact Sheet for Healthcare Providers: IncredibleEmployment.be  This test is not yet  approved or cleared by the [place] FDA and has been authorized for detection and/or diagnosis of SARS-CoV-2 by FDA under an Emergency Use Authorization (EUA). This EUA will remain in effect (meaning this test can be used) for the duration of the COVID-19 declaration under Section 564(b)(1) of the Act, 21 U.S.C. section 360bbb-3(b)(1), unless the authorization is terminated or revoked.  Performed at [facility], [address] [place], [place] [address]   Blood culture (routine x 2)     Status: None   Collection Time: 05/18/21  1:44 AM   Specimen: BLOOD  Result Value Ref Range Status   Specimen Description BLOOD RIGHT ANTECUBITAL  Final   Special Requests   Final    BOTTLES DRAWN AEROBIC AND ANAEROBIC Blood Culture adequate volume   Culture   Final    NO GROWTH 5 DAYS Performed at [facility], [address] [place], [place] [address]    Report Status 05/23/2021 FINAL  Final  Blood culture (routine x 2)     Status: None   Collection Time: 05/18/21  1:44 AM   Specimen: BLOOD  Result Value Ref Range Status   Specimen Description BLOOD BLOOD RIGHT FOREARM  Final   Special Requests   Final    BOTTLES DRAWN AEROBIC AND ANAEROBIC Blood Culture adequate volume   Culture   Final    NO GROWTH 5 DAYS Performed at [facility], [address] [place], [place] [address]    Report Status 05/23/2021 FINAL  Final  Acid Fast Smear (AFB)     Status: None   Collection Time: 05/18/21 11:25 AM   Specimen: PATH Cytology Pleural fluid  Result Value Ref Range Status   AFB Specimen Processing Concentration  Final   Acid Fast Smear Negative  Final    Comment: (NOTE) Performed At: [facility] [place], [place] [id number] Rush Farmer MD PN:[id number]    Source (AFB) PLEURAL  Final    Comment: Performed at [facility], [address] [place], [place] [address]  Body fluid culture w Gram Stain     Status: None    Collection Time: 05/18/21 11:25 AM   Specimen: PATH Cytology Pleural fluid  Result Value Ref Range Status   Specimen Description   Final    PLEURAL Performed at [facility], [address] [place],  [address]    Special Requests   Final    NONE Performed at [facility], [address] [place],  [address]    Gram Stain   Final    RARE WBC PRESENT,  PREDOMINANTLY MONONUCLEAR NO ORGANISMS SEEN    Culture   Final    NO GROWTH 3 DAYS Performed at [facility], [place] [address] [place], [place] [address]    Report Status 05/21/2021 FINAL  Final  Respiratory (~20 pathogens) panel by PCR     Status: None   Collection Time: 05/20/21  7:41 AM   Specimen: Nasopharyngeal Swab; Respiratory  Result Value Ref Range Status   Adenovirus NOT DETECTED NOT DETECTED Final   Coronavirus 229E NOT DETECTED NOT DETECTED Final    Comment: (NOTE) The Coronavirus on the Respiratory Panel, DOES NOT test for the novel  Coronavirus (2019 nCoV)    Coronavirus HKU1 NOT DETECTED NOT DETECTED Final   Coronavirus NL63 NOT DETECTED NOT DETECTED Final   Coronavirus OC43 NOT DETECTED NOT DETECTED Final   Metapneumovirus NOT DETECTED NOT DETECTED Final   Rhinovirus / Enterovirus NOT DETECTED NOT DETECTED Final   Influenza A NOT DETECTED NOT DETECTED Final   Influenza B NOT DETECTED NOT DETECTED Final   Parainfluenza Virus 1 NOT DETECTED NOT DETECTED Final   Parainfluenza Virus 2 NOT DETECTED NOT DETECTED Final   Parainfluenza Virus 3 NOT DETECTED NOT DETECTED Final   Parainfluenza Virus 4 NOT DETECTED NOT DETECTED Final   Respiratory Syncytial Virus NOT DETECTED NOT DETECTED Final   Bordetella pertussis NOT DETECTED NOT DETECTED Final   Bordetella Parapertussis NOT DETECTED NOT DETECTED Final   Chlamydophila pneumoniae NOT DETECTED NOT DETECTED Final   Mycoplasma pneumoniae NOT DETECTED NOT DETECTED Final    Comment: Performed at [facility], [place] [place],  [address]  Resp Panel by RT-PCR (Flu A&B, Covid) Nasopharyngeal Swab     Status: None   Collection Time: 05/25/21 12:37 PM   Specimen: Nasopharyngeal Swab; Nasopharyngeal(NP) swabs in vial transport medium  Result Value Ref Range Status   SARS Coronavirus 2 by RT PCR NEGATIVE NEGATIVE Final    Comment: (NOTE) SARS-CoV-2 target nucleic acids are NOT DETECTED.  The SARS-CoV-2 RNA is generally detectable in upper respiratory specimens during the acute phase of infection. The lowest concentration of SARS-CoV-2 viral copies this assay can detect is 138 copies/mL. A negative result does not preclude SARS-Cov-2 infection and should not be used as the sole basis for treatment or other patient management decisions. A negative result may occur with  improper specimen collection/handling, submission of specimen other than nasopharyngeal swab, presence of viral mutation(s) within the areas targeted by this assay, and inadequate number of viral copies(<138 copies/mL). A negative result must be combined with clinical observations, patient history, and epidemiological information. The expected result is Negative.  Fact Sheet for Patients:  EntrepreneurPulse.com.au  Fact Sheet for Healthcare Providers:  IncredibleEmployment.be  This test is no t yet approved or cleared by the [place] FDA and  has been authorized for detection and/or diagnosis of SARS-CoV-2 by FDA under an Emergency Use Authorization (EUA). This EUA will remain  in effect (meaning this test can be used) for the duration of the COVID-19 declaration under Section 564(b)(1) of the Act, 21 U.S.C.section 360bbb-3(b)(1), unless the authorization is terminated  or revoked sooner.       Influenza A by PCR NEGATIVE NEGATIVE Final   Influenza B by PCR NEGATIVE NEGATIVE Final    Comment: (NOTE) The Xpert Xpress SARS-CoV-2/FLU/RSV plus assay is intended as an aid in the diagnosis  of influenza from Nasopharyngeal swab specimens and should not be used as a sole basis for treatment. Nasal washings  and aspirates are unacceptable for Xpert Xpress SARS-CoV-2/FLU/RSV testing.  Fact Sheet for Patients: EntrepreneurPulse.com.au  Fact Sheet for Healthcare Providers: IncredibleEmployment.be  This test is not yet approved or cleared by the [place] FDA and has been authorized for detection and/or diagnosis of SARS-CoV-2 by FDA under an Emergency Use Authorization (EUA). This EUA will remain in effect (meaning this test can be used) for the duration of the COVID-19 declaration under Section 564(b)(1) of the Act, 21 U.S.C. section 360bbb-3(b)(1), unless the authorization is terminated or revoked.  Performed at [facility], [address] [place], [place] [address]          Radiology Studies: No results found.      Scheduled Meds:  arformoterol  15 mcg Nebulization BID   vitamin C  500 mg Oral BID   aspirin  81 mg Oral Daily   atorvastatin  80 mg Oral Daily   budesonide (PULMICORT) nebulizer solution  0.25 mg Nebulization BID   carvedilol  25 mg Oral BID   Chlorhexidine Gluconate Cloth  6 each Topical Q0600   Chlorhexidine Gluconate Cloth  6 each Topical Q0600   epoetin (EPOGEN/PROCRIT) injection  10,000 Units Intravenous Q M,W,F-HD   feeding supplement (NEPRO CARB STEADY)  237 mL Oral TID BM   ferric citrate  420 mg Oral TID WC   gabapentin  400 mg Oral QHS   heparin  5,000 Units Subcutaneous Q8H   hydrALAZINE  100 mg Oral Q8H   insulin aspart  0-5 Units Subcutaneous QHS   insulin aspart  0-9 Units Subcutaneous TID WC   insulin detemir  8 Units Subcutaneous Daily   ipratropium-albuterol  3 mL Nebulization BID   isosorbide dinitrate  20 mg Oral TID   levETIRAcetam  500 mg Oral Daily   lisinopril  40 mg Oral Daily   mouth rinse  15 mL Mouth Rinse q12n4p   multivitamin  1 tablet Oral QHS   sevelamer  carbonate  1,600 mg Oral TID WC   Continuous Infusions:   LOS: 7 days    Time spent: 33 mins    Wyvonnia Dusky, MD [facility] Pager [phone]  If 7PM-7AM, please contact night-coverage 05/26/2021, 7:28 AM

## 2021-05-26 NOTE — Telephone Encounter (Signed)
Copied from [place] [phone]. Topic: General - Other >> May 26, 2021  9:06 AM Leward Quan A wrote: Reason for CRM: Patient wife [name] called in to ask for acall back from Dr Sabino Snipes nurse about some paper work she is checking on the status. Please call [name] at Ph#  [phone]

## 2021-05-26 NOTE — Discharge Summary (Addendum)
Physician Discharge Summary  [name] JKD:[id number] DOB: 09-May-1961 DOA: 05/18/2021  PCP: [name] Sons, MD  Admit date: 05/18/2021 Discharge date: 05/26/2021  Admitted From: home Disposition:  SNF  Recommendations for Outpatient Follow-up:  Follow up with PCP in 1-2 weeks F/u w/ cardio, [name], in 1-2 weeks F/u w/ nephro, [name], in 1-2 weeks  Home Health: no Equipment/Devices:  Discharge Condition: stable  CODE STATUS: full  Diet recommendation: Heart Healthy / Carb Modified   Brief/Interim Summary: HPI was taken from [name]: [name] is a 60 y.o. male with history of ESRD on hemodialysis on Monday Wednesday Friday, hypertension, seizure disorder, anemia, systolic heart failure, hiradenitis on [name] presents to the ER because of worsening shortness of breath.  Patient was recently admitted and discharged about 2 days ago after being treated for fluid overload and possible pneumonia.  Per patient's wife who provided most of the history patient has been getting progressively short of breath over the last 2 days with no associated chest pain or productive cough fever chills.   ED Course: In the ER initially patient required 10 L of oxygen to maintain sats more than 90%.  Chest x-ray shows worsening left-sided pleural effusion with possible consolidation.  Labs also show drop in hemoglobin from 8 it is around 6.8 now.  COVID test was negative.  Patient was started on antibiotics for pneumonia admitted for further work-up  As per [name]: 60 year old male history of ESRD on hemodialysis MWF, hypertension, seizure disorder, anemia, systolic chronic heart failure, hidradenitis on [name], bilateral BKA who presents to the ED for evaluation of worsening shortness of breath.  Patient was recently admitted and discharged on 7/29 for similar complaints.  Now presents with persistent fluid overload, possible pneumonia associated with parapneumonic effusion.  Patient is  chronically ill and majority of history is gained by speaking with patient's wife.  Patient's wife states that shortness of breath progressively worsened after discharge.  Patient has no associated chest pain, productive cough, fever, chills.  Initially required 10 L oxygen to maintain saturation greater than 90.  Hemoglobin also dropped to 6.8 from baseline approximate.ly 8.  Started on antibiotics for pneumonia, thoracentesis ordered, nephrology consulted.   8/1: Received page from ICU charge RN.  Patient had rapid response called while in hemodialysis.  I went and evaluated the patient at bedside.  Found him to be on a nonrebreather with saturations in the low to mid 80s.  Work of breathing not especially increased.  Lung sounds very coarse and rhonchorous.  Unable to wean off nonrebreather.  Of note patient did get a thoracentesis with 1 L fluid removed today.  No fluid removal was attempted with hemodialysis.  Patient only completed about 30 minutes of his dialysis session.   Will be transferred to stepdown unit for closer monitoring.  BiPAP initiated.  COPD treatment started.  Case discussed at length with ICU attending.  Consultation greatly appreciated   8/2: Respiratory status much improved.  Patient weaned off BiPAP.  Saturating well on 2 L nasal cannula.  Stable for transfer to medical floor.  Cardiology consult requested and appreciated. 8/3: Was not looking too good and family was debating for comfort care.  Hemoglobin dropped to 5.8 requiring 1 unit of packed RBC transfusion 8/4 patient much more alert, hemoglobin stabilized and eating.  Family not interested in hospice anymore and wants to continue dialysis 8/5: Moved out on the floor 8/6: Waiting for SNF bed 8/7: Waiting for SNF  8/8: HD today.  No local SNF bed offered so patient's family has accepted SNF at [facility].  Dialysis coordinator is working on getting spot for dialysis center in [place] now   8/9: Dialysis coordinator  was able to find the pt an outpatient spot for HD near the SNF   Discharge Diagnoses:  Principal Problem:   Acute respiratory failure with hypoxia ([place]) Active Problems:   Hypertension   Anemia due to chronic kidney disease   Crohn disease ([place])   Hidradenitis suppurativa   Type 2 diabetes mellitus with kidney complication, with long-term current use of insulin (HCC)   End stage renal failure on dialysis ([place])   History of CVA (cerebrovascular accident)   Seizure disorder ([place])   S/P bilateral BKA (below knee amputation) ([place])   Acute on chronic anemia   Chronic systolic CHF (congestive heart failure) ([place])   Long term current use of immunosuppressive drug   Acute decompensated heart failure ([place])   Unstageable pressure ulcer of sacral region ([place])   Acute hypoxic respiratory failure: present on admission. RRT called during dialysis on 8/1 for worsening hypoxia requiring transfer to stepdown for BiPAP. S/p thoracentesis on 8/1 with removal of 1 L of fluid, transudative. CTA chest neg for PE on 8/3. Resolved   ACD: likely secondary to ESRD. S/p 1 unit of pRBCs on 8/3. H&H are trending up today   CAP: completed abx course. Encourage incentive spirometry    Acute on chronic decompensated combined CHF: echo showing EF of 30 to 35%.  Ischemic work-up not planned by cardiology due to severe anemia.  Medical management recommended. Clonidine d/c by cardio. Continue on coreg, lisinopril, hydralazine, & isosorbide.   HTN: uncontrolled. Continue on coreg, isosorbide, lisinopril, & hydralazine   ESRD: on HD. HD as per nephro   HLD: continue on statin   DM2: continue on levemir, SSI w/ accuchecks while inpatient. Well controlled w/ HbA1c 6.0.    Seizure disorder: continue on keppra.    History of hidradenitis: continue to hold humira   PAD: s/p b/l BKA. Continue w/ supportive care   Severe protein-calorie malnutrition: BMI 18.48. Continue on nutritional supplements   Discharge  Instructions  Discharge Instructions     Diet - low sodium heart healthy   Complete by: As directed    Diet Carb Modified   Complete by: As directed    Discharge instructions   Complete by: As directed    F/u w/ PCP in 1-2 weeks. F/u w/ nephro, [name], in 1-2 weeks. F/u w/ cardio, [name], in 1-2 weeks   Increase activity slowly   Complete by: As directed    Increase activity slowly   Complete by: As directed    No wound care   Complete by: As directed    No wound care   Complete by: As directed       Allergies as of 05/26/2021       Reactions   Methotrexate Other (See Comments)   Blood count drops   Vancomycin Shortness Of Breath   Eyes watering, SOB, wheezing   Cefepime Other (See Comments)   Tape         Medication List     STOP taking these medications    amLODipine 10 MG tablet Commonly known as: NORVASC   Auryxia 1 GM 210 MG(Fe) tablet Generic drug: ferric citrate   cloNIDine 0.2 MG tablet Commonly known as: CATAPRES   furosemide 80 MG tablet Commonly known as: LASIX  TAKE these medications    acetaminophen 500 MG tablet Commonly known as: TYLENOL Take 1,000 mg by mouth daily as needed for moderate pain or headache.   Alcohol Swabs Pads Use as directed to check blood sugar three times daily for insulin dependent type 2 diabetes.   aspirin 81 MG chewable tablet Chew 81 mg by mouth daily.   atorvastatin 80 MG tablet Commonly known as: LIPITOR TAKE 1 TABLET(80 MG) BY MOUTH DAILY   carvedilol 25 MG tablet Commonly known as: COREG Take 1 tablet (25 mg total) by mouth 2 (two) times daily.   feeding supplement Liqd Take 237 mLs by mouth 3 (three) times daily between meals.   gabapentin 100 MG capsule Commonly known as: NEURONTIN Take 100 mg by mouth at bedtime. Take along with 347m capsule at bedtime   [name] Pen 40 MG/0.4ML Pnkt Generic drug: Adalimumab Inject 40 mg into the muscle once a week.   hydrALAZINE 100 MG  tablet Commonly known as: APRESOLINE Take 1 tablet (100 mg total) by mouth every 8 (eight) hours.   isosorbide dinitrate 20 MG tablet Commonly known as: ISORDIL Take 1 tablet (20 mg total) by mouth 3 (three) times daily.   levETIRAcetam 500 MG tablet Commonly known as: KEPPRA Take 1 tablet (500 mg total) by mouth daily.   lisinopril 40 MG tablet Commonly known as: ZESTRIL Take 40 mg by mouth daily.   multivitamin Tabs tablet Take 1 tablet by mouth at bedtime.   ONE TOUCH ULTRA 2 w/Device Kit Use as directed to check blood sugar three times daily. E11.9   sevelamer carbonate 800 MG tablet Commonly known as: RENVELA TAKE 1 TABLET(800 MG) BY MOUTH THREE TIMES DAILY   spironolactone 25 MG tablet Commonly known as: ALDACTONE Take 1 tablet by mouth at bedtime.        Contact information for follow-up providers     FBirdie Sons MD. Go on 06/04/2021.   Specialty: Family Medicine Why: [facility] @ 1:20 pm Contact information: [address] [place] [id number] [phone]         GMinna Merritts MD Follow up on 05/28/2021.   Specialty: Cardiology [facility]: [facility] [place]: [place] [phone]             Contact information for after-discharge care     [facility]   Service: Skilled Nursing Contact information: [phone]                   Allergies  Allergen Reactions   Methotrexate Other (See Comments)    Blood count drops   Vancomycin Shortness Of Breath    Eyes watering, SOB, wheezing   Cefepime Other (See Comments)   Tape     Consultations: Cardio Nephro    Procedures/Studies: DG Chest 2 View  Result Date: 05/14/2021 CLINICAL DATA:  Shortness of breath. EXAM: CHEST - 2 VIEW COMPARISON:  May 12, 2021. FINDINGS:  Stable cardiomegaly is noted. Bilateral perihilar and basilar densities are noted concerning for edema or atelectasis. Stable loculated right pleural effusion is noted. Bony thorax is unremarkable. IMPRESSION: Stable cardiomegaly. Stable bilateral perihilar and basilar edema or atelectasis. Stable loculated right pleural effusion. Electronically Signed   By: JMarijo ConceptionM.D.   On: 05/14/2021 15:36   DG Chest 2  View  Result Date: 05/12/2021 CLINICAL DATA:  Intermittent shortness of breath. EXAM: CHEST - 2 VIEW COMPARISON:  May 12, 2021 (12:01 p.m.) FINDINGS: Stable diffusely increased interstitial lung markings are seen. Mild to moderate severity areas of atelectasis and/or infiltrate are noted within the bilateral lung bases, right greater than left. A small, stable right pleural effusion is noted. No pneumothorax is identified. The cardiac silhouette is mildly enlarged and unchanged in size. Radiopaque stents are again seen overlying the superior mediastinum and left axilla. Multiple right-sided rib deformities are seen. IMPRESSION: 1. Cardiomegaly with mild, stable interstitial edema. 2. Stable mild to moderate severity bibasilar atelectasis and/or infiltrate, right greater than left. 3. Small, stable right pleural effusion. Electronically Signed   By: Virgina Norfolk M.D.   On: 05/12/2021 20:21   DG Chest 2 View  Result Date: 05/12/2021 CLINICAL DATA:  Cough and shortness of breath. EXAM: CHEST - 2 VIEW COMPARISON:  02/06/2021 FINDINGS: Vascular stents involving the left central venous structures. Right basilar chest densities are compatible with pleural fluid and compressive atelectasis. Cannot exclude airspace disease or pneumonia at the right lung base. Slightly increased interstitial lung densities bilaterally. Heart size is upper limits of normal. IMPRESSION: 1. Right basilar chest densities are compatible with pleural fluid with atelectasis and consolidation. Cannot exclude right lower lobe  pneumonia. 2. Interstitial pulmonary edema. 3. These results will be called to the ordering clinician or representative by the Radiologist Assistant, and communication documented in the PACS or [employer]. Electronically Signed   By: Markus Daft M.D.   On: 05/12/2021 15:59   CT CHEST WO CONTRAST  Result Date: 05/18/2021 CLINICAL DATA:  61 year old male with history of chest pain or shortness of breath. EXAM: CT CHEST WITHOUT CONTRAST TECHNIQUE: Multidetector CT imaging of the chest was performed following the standard protocol without IV contrast. COMPARISON:  Chest CT 05/13/2021. FINDINGS: Cardiovascular: Heart size is enlarged. There is no significant pericardial fluid, thickening or pericardial calcification. There is aortic atherosclerosis, as well as atherosclerosis of the great vessels of the mediastinum and the coronary arteries, including calcified atherosclerotic plaque in the left main, left anterior descending, left circumflex and right coronary arteries. Calcifications of the aortic valve and mitral annulus. Vascular stent in the left innominate vein. Mediastinum/Nodes: Assessment for lymphadenopathy is exceedingly limited on today's noncontrast CT examination, particularly in light of the patient's thin body habitus. Thickening of the esophagus most notably in the mid esophagus (axial image 41 of series 2), poorly evaluated on today's noncontrast CT examination. No axillary lymphadenopathy. Lungs/Pleura: Right-sided pleural calcification. No left pleural calcification identified. Small right and moderate left pleural effusions. The majority of the left pleural effusion is sub pulmonic in distribution. There continues to be generalized mild ground-glass attenuation and interlobular septal thickening in the lungs bilaterally suggesting a background of mild interstitial pulmonary edema. Compared to the prior study, the left lower lobe is now opacified with only a small amount of volume loss,  indicating predominantly airspace consolidation rather than simple atelectasis. Upper Abdomen: Aortic atherosclerosis. Musculoskeletal: There are no aggressive appearing lytic or blastic lesions noted in the visualized portions of the skeleton. IMPRESSION: 1. There continues to be evidence of congestive heart failure, as above. However, today's study also demonstrates new airspace consolidation in the left lower lobe concerning for pneumonia. 2. Small right and moderate left pleural effusions. Left pleural effusion has increased substantially in size and is predominantly sub pulmonic in location, potentially loculated. 3. Aortic atherosclerosis, in addition to left main  and 3 vessel coronary artery disease. Please note that although the presence of coronary artery calcium documents the presence of coronary artery disease, the severity of this disease and any potential stenosis cannot be assessed on this non-gated CT examination. Assessment for potential risk factor modification, dietary therapy or pharmacologic therapy may be warranted, if clinically indicated. 4. There are calcifications of the aortic valve and mitral annulus. Echocardiographic correlation for evaluation of potential valvular dysfunction may be warranted if clinically indicated. Aortic Atherosclerosis (ICD10-I70.0). Electronically Signed   By: Vinnie Langton M.D.   On: 05/18/2021 05:33   CT CHEST WO CONTRAST  Result Date: 05/13/2021 CLINICAL DATA:  Abnormal chest x-ray EXAM: CT CHEST WITHOUT CONTRAST TECHNIQUE: Multidetector CT imaging of the chest was performed following the standard protocol without IV contrast. COMPARISON:  Prior CT scan of the chest 05/19/2017 FINDINGS: Cardiovascular: Limited evaluation in the absence of intravenous contrast. Metallic stents are present in the left innominate vein and left axillary vein. The main pulmonary artery is enlarged at nearly form cm in diameter. The aorta is normal in caliber. Atherosclerotic  calcifications noted in the aorta, branch arteries and throughout the coronary arteries. Marked cardiomegaly. The intracardiac blood pool is hypodense relative to the adjacent myocardium consistent with anemia. No pericardial effusion. Mediastinum/Nodes: Limited evaluation in the absence of intravenous contrast and asthenic neck body habitus with minimal fat planes to separate the soft tissue density structures of the mediastinum. Left hilar lymph node measures 1 cm in short axis slightly larger when compared with prior imaging. Ill-defined soft tissue thickening throughout the right paratracheal and right perihilar space very poorly evaluated without intravenous contrast. Correlation with prior CT imaging demonstrates evidence of prior mediastinal abscess in these locations. This may represent residual scarring. Lungs/Pleura: Interlobular septal thickening. Diffuse bronchial wall thickening throughout both lungs. Debris present within the distal trachea and right mainstem bronchus likely representing secretions. Ground-glass attenuation airspace opacities with areas of more confluence noted in the right perihilar lung and extending along the Peri bronchovascular tree. Small right pleural effusion with thickened and calcified pleural rind. Upper Abdomen: Severe calcifications of visualized intra-abdominal vessels. Bilateral renal atrophy. No acute abnormality. Musculoskeletal: No acute fracture or aggressive appearing lytic or blastic osseous lesion. IMPRESSION: 1. Very limited evaluation in the absence of intravenous contrast, particularly given patient's very thin body habitus. Without fat planes to separate the similar density soft tissue structures within the mediastinum resolution is quite limited. 2. Cardiomegaly with pulmonary edema most consistent with CHF. 3. Changes are asymmetric and more impressive in the right perihilar region. In the appropriate clinical setting, a superimposed bronchopneumonia is a  consideration. Malignancy is also possible, but considered less likely. Consider repeat CT scan of the chest with intravenous contrast after treatment of patient's presumed CHF/volume overload. 4. Small volume of secretions present within the distal trachea and right mainstem bronchus. 5. Probable chronic ill-defined soft tissue thickening within the right paratracheal mediastinum and right perihilar mediastinum may be secondary to prior mediastinal abscesses as seen on prior imaging from August of 2018. 6. Similarly, chronic small right-sided pleural effusion with thickened and calcified pleural rind is also likely residua from the event in 2018. 7. Pulmonary arterial enlargement suggests underlying pulmonary arterial hypertension. 8. The intracardiac blood pool is hypodense relative to the adjacent myocardium consistent with anemia. 9. Aortic and advanced coronary artery atherosclerotic calcifications. 10. Metallic stents in the left brachiocephalic and axillary veins likely related to hemodialysis access. Aortic Atherosclerosis (ICD10-I70.0). Electronically Signed   By:  Jacqulynn Cadet M.D.   On: 05/13/2021 06:29   CT Angio Chest Pulmonary Embolism (PE) W or WO Contrast  Result Date: 05/20/2021 CLINICAL DATA:  Shortness of breath.  PE suspected, high prob EXAM: CT ANGIOGRAPHY CHEST WITH CONTRAST TECHNIQUE: Multidetector CT imaging of the chest was performed using the standard protocol during bolus administration of intravenous contrast. Multiplanar CT image reconstructions and MIPs were obtained to evaluate the vascular anatomy. CONTRAST:  103m OMNIPAQUE IOHEXOL 350 MG/ML SOLN COMPARISON:  05/18/2021 FINDINGS: Cardiovascular: No filling defects in the pulmonary arteries to suggest pulmonary emboli. Extensive coronary artery calcifications and scattered aortic calcifications. No aneurysm. Heart is mildly enlarged. Mediastinum/Nodes: Mildly prominent mediastinal lymph nodes, similar to prior study. Esophageal  wall thickening again noted in the mid esophagus. Trachea and thyroid unremarkable. Lungs/Pleura: Small bilateral pleural effusions, similar prior study. Bilateral lower lobe airspace opacities, with some improvement in the left lower lobe since prior study. Pleural calcifications in the right lower hemithorax, stable. Upper Abdomen: Imaging into the upper abdomen demonstrates no acute findings. Musculoskeletal: Chest wall soft tissues are unremarkable. No acute bony abnormality. Review of the MIP images confirms the above findings. IMPRESSION: No evidence of pulmonary embolus. Cardiomegaly, coronary artery disease. Small bilateral effusions. Bilateral lower lobe airspace opacities with some improvement on the left since prior study. Findings could reflect edema or infection. Aortic Atherosclerosis (ICD10-I70.0). Electronically Signed   By: KRolm BaptiseM.D.   On: 05/20/2021 08:37   DG Chest Port 1 View  Result Date: 05/18/2021 CLINICAL DATA:  Respiratory distress. Recent left-sided thoracentesis. EXAM: PORTABLE CHEST 1 VIEW COMPARISON:  05/18/2021 FINDINGS: Portable semi upright view of the chest was obtained. Grossly stable cardiomediastinal contours. Aortic atherosclerosis. Moderate-sized right-sided pleural effusion with extensive airspace opacities within the right lung. Diffuse interstitial opacities throughout the left lung with slight improved aeration of the left lung base. No pneumothorax. Vascular stents again seen in the left axillary and brachiocephalic regions. IMPRESSION: 1. Slight improved aeration of the left lung base post thoracentesis. No pneumothorax. 2. Otherwise stable chest with multifocal opacities and moderate right-sided pleural effusion. Electronically Signed   By: NDavina PokeD.O.   On: 05/18/2021 15:37   DG Chest Port 1 View  Result Date: 05/18/2021 CLINICAL DATA:  Status post left thoracentesis EXAM: PORTABLE CHEST 1 VIEW COMPARISON:  05/18/2021, 1:54 a.m. FINDINGS: Interval  reduction in volume of a previously seen large left pleural effusion, now small and layering, with improved aeration of the left lung. Interval increase in volume of right-sided pleural effusion with increased consolidation of the right lung. Cardiomegaly. Left axillary and brachiocephalic stents. IMPRESSION: 1. Interval reduction in volume of a previously seen large left pleural effusion, now small and layering, with improved aeration of the left lung. No pneumothorax. 2. Interval increase in volume of right-sided pleural effusion with increased consolidation of the right lung. 3. Cardiomegaly. Electronically Signed   By: AEddie CandleM.D.   On: 05/18/2021 12:51   DG Chest Port 1 View  Result Date: 05/18/2021 CLINICAL DATA:  Shortness of breath. EXAM: PORTABLE CHEST 1 VIEW COMPARISON:  Chest x-ray 05/14/2021, CT chest 05/13/2021 FINDINGS: Redemonstration of vascular stents overlying the mediastinum as well as left axilla. Enlarged cardiac silhouette. The heart size and mediastinal contours are grossly unchanged. Aortic calcification. No focal consolidation. No pulmonary edema. Interval increase in size of a moderate volume left pleural effusion. At least trace right pleural effusion. Patchy interstitial and airspace opacities. No pneumothorax. No acute osseous abnormality. IMPRESSION: 1. Interval  increase in size of a moderate volume left pleural effusion. 2. At least trace right pleural effusion. 3. Patchy interstitial and airspace opacities. Findings could represent infection versus inflammation or pulmonary edema. Electronically Signed   By: Iven Finn M.D.   On: 05/18/2021 02:19   ECHOCARDIOGRAM COMPLETE  Result Date: 05/13/2021    ECHOCARDIOGRAM REPORT   Patient Name:   RAYANSH HERBST Brady Date of Exam: 05/13/2021 Medical Rec #:  [id number]       Height:       71.0 in Accession #:    [id number]      Weight:       140.0 lb Date of Birth:  1961/04/24       BSA:          1.812 m Patient Age:    21 years         BP:           152/71 mmHg Patient Gender: M               HR:           63 bpm. Exam Location:  [facility] Procedure: 2D Echo, Cardiac Doppler and Color Doppler Indications:     CHF-acute systolic X44.81  History:         Patient has prior history of Echocardiogram examinations, most                  recent 09/13/2020. Risk Factors:Hypertension and Diabetes.  Sonographer:     Sherrie Sport RDCS (AE) Referring Phys:  [id number] Victoriano Lain A THOMAS Diagnosing Phys: Nelva Bush MD IMPRESSIONS  1. Left ventricular ejection fraction, by estimation, is 30 to 35%. The left ventricle has moderately decreased function. The left ventricle demonstrates global hypokinesis. The left ventricular internal cavity size was mildly dilated. There is mild left ventricular hypertrophy. Left ventricular diastolic parameters are consistent with Grade II diastolic dysfunction (pseudonormalization). Elevated left atrial pressure.  2. Right ventricular systolic function is moderately reduced. The right ventricular size is moderately enlarged. There is normal pulmonary artery systolic pressure.  3. Left atrial size was severely dilated.  4. Large pleural effusion in the left lateral region.  5. The mitral valve is abnormal. Mild to moderate mitral valve regurgitation. No evidence of mitral stenosis. Moderate mitral annular calcification.  6. Tricuspid valve regurgitation is moderate.  7. The aortic valve is tricuspid. There is moderate calcification of the aortic valve. There is moderate thickening of the aortic valve. Aortic valve regurgitation is not visualized. Mild to moderate aortic valve sclerosis/calcification is present, without any evidence of aortic stenosis.  8. The inferior vena cava is normal in size with <50% respiratory variability, suggesting right atrial pressure of 8 mmHg. FINDINGS  Left Ventricle: Left ventricular ejection fraction, by estimation, is 30 to 35%. The left ventricle has moderately decreased function. The left  ventricle demonstrates global hypokinesis. The left ventricular internal cavity size was mildly dilated. There is mild left ventricular hypertrophy. Left ventricular diastolic parameters are consistent with Grade II diastolic dysfunction (pseudonormalization). Elevated left atrial pressure. Right Ventricle: The right ventricular size is moderately enlarged. No increase in right ventricular wall thickness. Right ventricular systolic function is moderately reduced. There is normal pulmonary artery systolic pressure. The tricuspid regurgitant velocity is 2.59 m/s, and with an assumed right atrial pressure of 8 mmHg, the estimated right ventricular systolic pressure is 70.2 mmHg. Left Atrium: Left atrial size was severely dilated. Right Atrium: Right atrial size was normal in size. Pericardium:  There is no evidence of pericardial effusion. Mitral Valve: The mitral valve is abnormal. There is mild thickening of the mitral valve leaflet(s). Moderate mitral annular calcification. Mild to moderate mitral valve regurgitation. No evidence of mitral valve stenosis. Tricuspid Valve: The tricuspid valve is grossly normal. Tricuspid valve regurgitation is moderate. Aortic Valve: The aortic valve is tricuspid. There is moderate calcification of the aortic valve. There is moderate thickening of the aortic valve. Aortic valve regurgitation is not visualized. Mild to moderate aortic valve sclerosis/calcification is present, without any evidence of aortic stenosis. Aortic valve mean gradient measures 3.0 mmHg. Aortic valve peak gradient measures 5.9 mmHg. Aortic valve area, by VTI measures 2.06 cm. Pulmonic Valve: The pulmonic valve was grossly normal. Pulmonic valve regurgitation is not visualized. No evidence of pulmonic stenosis. Aorta: The aortic root is normal in size and structure. Pulmonary Artery: The pulmonary artery is of normal size. Venous: The inferior vena cava is normal in size with less than 50% respiratory  variability, suggesting right atrial pressure of 8 mmHg. IAS/Shunts: There is right bowing of the interatrial septum, suggestive of elevated left atrial pressure. The interatrial septum was not well visualized. Additional Comments: There is a large pleural effusion in the left lateral region.  LEFT VENTRICLE PLAX 2D LVIDd:         5.70 cm      Diastology LVIDs:         4.60 cm      LV e' medial:    4.90 cm/s LV PW:         1.40 cm      LV E/e' medial:  21.0 LV IVS:        1.00 cm      LV e' lateral:   4.90 cm/s LVOT diam:     2.00 cm      LV E/e' lateral: 21.0 LV SV:         51 LV SV Index:   28 LVOT Area:     3.14 cm  LV Volumes (MOD) LV vol d, MOD A2C: 197.0 ml LV vol d, MOD A4C: 223.0 ml LV vol s, MOD A2C: 113.0 ml LV vol s, MOD A4C: 104.0 ml LV SV MOD A2C:     84.0 ml LV SV MOD A4C:     223.0 ml LV SV MOD BP:      103.0 ml RIGHT VENTRICLE RV Basal diam:  6.00 cm RV S prime:     9.79 cm/s TAPSE (M-mode): 1.6 cm LEFT ATRIUM              Index       RIGHT ATRIUM           Index LA diam:        5.70 cm  3.15 cm/m  RA Area:     16.80 cm LA Vol (A2C):   94.0 ml  51.88 ml/m RA Volume:   39.50 ml  21.80 ml/m LA Vol (A4C):   143.0 ml 78.92 ml/m LA Biplane Vol: 121.0 ml 66.78 ml/m  AORTIC VALVE                   PULMONIC VALVE AV Area (Vmax):    1.86 cm    PV Vmax:        0.78 m/s AV Area (Vmean):   2.15 cm    PV Peak grad:   2.5 mmHg AV Area (VTI):     2.06 cm    RVOT Peak grad:  4 mmHg AV Vmax:           121.00 cm/s AV Vmean:          70.600 cm/s AV VTI:            0.248 m AV Peak Grad:      5.9 mmHg AV Mean Grad:      3.0 mmHg LVOT Vmax:         71.80 cm/s LVOT Vmean:        48.400 cm/s LVOT VTI:          0.163 m LVOT/AV VTI ratio: 0.66  AORTA Ao Root diam: 2.70 cm MITRAL VALVE                TRICUSPID VALVE MV Area (PHT): 4.04 cm     TR Peak grad:   26.8 mmHg MV Decel Time: 188 msec     TR Vmax:        259.00 cm/s MV E velocity: 103.00 cm/s MV A velocity: 55.30 cm/s   SHUNTS MV E/A ratio:  1.86          Systemic VTI:  0.16 m                             Systemic Diam: 2.00 cm Nelva Bush MD Electronically signed by Nelva Bush MD Signature Date/Time: 05/13/2021/4:59:08 PM    Final    US THORACENTESIS ASP PLEURAL SPACE W/IMG GUIDE  Result Date: 05/18/2021 INDICATION: Shortness of breath. Left-sided pleural effusion. Request for diagnostic and therapeutic thoracentesis. EXAM: ULTRASOUND GUIDED LEFT THORACENTESIS MEDICATIONS: 1% plain lidocaine, 5 mL COMPLICATIONS: None immediate. PROCEDURE: An ultrasound guided thoracentesis was thoroughly discussed with the patient and questions answered. The benefits, risks, alternatives and complications were also discussed. The patient understands and wishes to proceed with the procedure. Written consent was obtained. Ultrasound was performed to localize and mark an adequate pocket of fluid in the left chest. The area was then prepped and draped in the normal sterile fashion. 1% Lidocaine was used for local anesthesia. Under ultrasound guidance a 6 Fr Safe-T-Centesis catheter was introduced. Thoracentesis was performed. The catheter was removed and a dressing applied. FINDINGS: A total of approximately 1 L of clear, dark yellow fluid was removed. Samples were sent to the laboratory as requested by the clinical team. IMPRESSION: Successful ultrasound guided left thoracentesis yielding 1 L of pleural fluid. Read by: Ascencion Dike PA-C Electronically Signed   By: Jacqulynn Cadet M.D.   On: 05/18/2021 12:57   (Echo, Carotid, EGD, Colonoscopy, ERCP)    Subjective: Pt c/o malaise    Discharge Exam: Vitals:   05/26/21 0735 05/26/21 1106  BP: (!) 178/80 (!) 149/71  Pulse: 75 72  Resp: 18 16  Temp: 97.9 F (36.6 C) 98.3 F (36.8 C)  SpO2: 100% 99%   Vitals:   05/26/21 0413 05/26/21 0500 05/26/21 0735 05/26/21 1106  BP: (!) 181/78  (!) 178/80 (!) 149/71  Pulse: 74  75 72  Resp: _0 Temp: 98.3 F (36.8 C)  97.9 F (36.6 C) 98.3 F (36.8 C)   TempSrc:    Oral  SpO2: 97%  100% 99%  Weight:  60.1 kg    Height:        General exam: Appears calm and comfortable Respiratory system: diminished breath sounds b/l otherwise clear  Cardiovascular system: S1 & S2 +. No rubs, gallops or clicks. Gastrointestinal system: Abdomen is  nondistended, soft and nontender. Hypoactive bowel sounds heard. Central nervous system: Alert and oriented. Moves all extremities  Psychiatry: Judgement and insight appear normal. Flat mood and affect    The results of significant diagnostics from this hospitalization (including imaging, microbiology, ancillary and laboratory) are listed below for reference.     Microbiology: Recent Results (from the past 240 hour(s))  Resp Panel by RT-PCR (Flu A&B, Covid) Nasopharyngeal Swab     Status: None   Collection Time: 05/18/21  1:43 AM   Specimen: Nasopharyngeal Swab; Nasopharyngeal(NP) swabs in vial transport medium  Result Value Ref Range Status   SARS Coronavirus 2 by RT PCR NEGATIVE NEGATIVE Final    Comment: (NOTE) SARS-CoV-2 target nucleic acids are NOT DETECTED.  The SARS-CoV-2 RNA is generally detectable in upper respiratory specimens during the acute phase of infection. The lowest concentration of SARS-CoV-2 viral copies this assay can detect is 138 copies/mL. A negative result does not preclude SARS-Cov-2 infection and should not be used as the sole basis for treatment or other patient management decisions. A negative result may occur with  improper specimen collection/handling, submission of specimen other than nasopharyngeal swab, presence of viral mutation(s) within the areas targeted by this assay, and inadequate number of viral copies(<138 copies/mL). A negative result must be combined with clinical observations, patient history, and epidemiological information. The expected result is Negative.  Fact Sheet for Patients:  EntrepreneurPulse.com.au  Fact Sheet for  Healthcare Providers:  IncredibleEmployment.be  This test is no t yet approved or cleared by the [place] FDA and  has been authorized for detection and/or diagnosis of SARS-CoV-2 by FDA under an Emergency Use Authorization (EUA). This EUA will remain  in effect (meaning this test can be used) for the duration of the COVID-19 declaration under Section 564(b)(1) of the Act, 21 U.S.C.section 360bbb-3(b)(1), unless the authorization is terminated  or revoked sooner.       Influenza A by PCR NEGATIVE NEGATIVE Final   Influenza B by PCR NEGATIVE NEGATIVE Final    Comment: (NOTE) The Xpert Xpress SARS-CoV-2/FLU/RSV plus assay is intended as an aid in the diagnosis of influenza from Nasopharyngeal swab specimens and should not be used as a sole basis for treatment. Nasal washings and aspirates are unacceptable for Xpert Xpress SARS-CoV-2/FLU/RSV testing.  Fact Sheet for Patients: EntrepreneurPulse.com.au  Fact Sheet for Healthcare Providers: IncredibleEmployment.be  This test is not yet approved or cleared by the [place] FDA and has been authorized for detection and/or diagnosis of SARS-CoV-2 by FDA under an Emergency Use Authorization (EUA). This EUA will remain in effect (meaning this test can be used) for the duration of the COVID-19 declaration under Section 564(b)(1) of the Act, 21 U.S.C. section 360bbb-3(b)(1), unless the authorization is terminated or revoked.  Performed at [facility], [address] [place], [place] [address]   Blood culture (routine x 2)     Status: None   Collection Time: 05/18/21  1:44 AM   Specimen: BLOOD  Result Value Ref Range Status   Specimen Description BLOOD RIGHT ANTECUBITAL  Final   Special Requests   Final    BOTTLES DRAWN AEROBIC AND ANAEROBIC Blood Culture adequate volume   Culture   Final    NO GROWTH 5 DAYS Performed at [facility], [address] [place], [place] [address]    Report Status 05/23/2021 FINAL  Final  Blood culture (routine x 2)     Status: None   Collection Time: 05/18/21  1:44 AM  Specimen: BLOOD  Result Value Ref Range Status   Specimen Description BLOOD BLOOD RIGHT FOREARM  Final   Special Requests   Final    BOTTLES DRAWN AEROBIC AND ANAEROBIC Blood Culture adequate volume   Culture   Final    NO GROWTH 5 DAYS Performed at [facility], [address] [place], [place] [address]    Report Status 05/23/2021 FINAL  Final  Acid Fast Smear (AFB)     Status: None   Collection Time: 05/18/21 11:25 AM   Specimen: PATH Cytology Pleural fluid  Result Value Ref Range Status   AFB Specimen Processing Concentration  Final   Acid Fast Smear Negative  Final    Comment: (NOTE) Performed At: [facility] [place], [place] [id number] Rush Farmer MD JS:[id number]    Source (AFB) PLEURAL  Final    Comment: Performed at [facility], [address], [place], [place] [address]  Body fluid culture w Gram Stain     Status: None   Collection Time: 05/18/21 11:25 AM   Specimen: PATH Cytology Pleural fluid  Result Value Ref Range Status   Specimen Description   Final    PLEURAL Performed at [facility], [address], [place], [place] [address]    Special Requests   Final    NONE Performed at [facility], [address] [place], [place] [address]    Gram Stain   Final    RARE WBC PRESENT, PREDOMINANTLY MONONUCLEAR NO ORGANISMS SEEN    Culture   Final    NO GROWTH 3 DAYS Performed at [facility], [address], [place], [place] [address]    Report Status 05/21/2021 FINAL  Final  Respiratory (~20 pathogens) panel by PCR     Status: None   Collection Time: 05/20/21  7:41 AM   Specimen: Nasopharyngeal Swab; Respiratory  Result Value Ref Range Status   Adenovirus NOT DETECTED NOT DETECTED Final   Coronavirus 229E NOT DETECTED NOT  DETECTED Final    Comment: (NOTE) The Coronavirus on the Respiratory Panel, DOES NOT test for the novel  Coronavirus (2019 nCoV)    Coronavirus HKU1 NOT DETECTED NOT DETECTED Final   Coronavirus NL63 NOT DETECTED NOT DETECTED Final   Coronavirus OC43 NOT DETECTED NOT DETECTED Final   Metapneumovirus NOT DETECTED NOT DETECTED Final   Rhinovirus / Enterovirus NOT DETECTED NOT DETECTED Final   Influenza A NOT DETECTED NOT DETECTED Final   Influenza B NOT DETECTED NOT DETECTED Final   Parainfluenza Virus 1 NOT DETECTED NOT DETECTED Final   Parainfluenza Virus 2 NOT DETECTED NOT DETECTED Final   Parainfluenza Virus 3 NOT DETECTED NOT DETECTED Final   Parainfluenza Virus 4 NOT DETECTED NOT DETECTED Final   Respiratory Syncytial Virus NOT DETECTED NOT DETECTED Final   Bordetella pertussis NOT DETECTED NOT DETECTED Final   Bordetella Parapertussis NOT DETECTED NOT DETECTED Final   Chlamydophila pneumoniae NOT DETECTED NOT DETECTED Final   Mycoplasma pneumoniae NOT DETECTED NOT DETECTED Final    Comment: Performed at [facility], [place], [place], [place] [address]  Resp Panel by RT-PCR (Flu A&B, Covid) Nasopharyngeal Swab     Status: None   Collection Time: 05/25/21 12:37 PM   Specimen: Nasopharyngeal Swab; Nasopharyngeal(NP) swabs in vial transport medium  Result Value Ref Range Status   SARS Coronavirus 2 by RT PCR NEGATIVE NEGATIVE Final    Comment: (NOTE) SARS-CoV-2 target nucleic acids are NOT DETECTED.  The SARS-CoV-2 RNA is generally detectable in upper respiratory specimens during the acute phase of infection. The lowest concentration of SARS-CoV-2 viral copies this assay can detect is 138 copies/mL. A negative result does not preclude SARS-Cov-2 infection and should not be used as the sole basis for treatment or other patient management decisions. A negative result may occur with  improper specimen collection/handling, submission of specimen other than  nasopharyngeal swab, presence of viral mutation(s) within the areas targeted by this assay, and inadequate number of viral copies(<138 copies/mL). A negative result must be combined with clinical observations, patient history, and epidemiological information. The expected result is Negative.  Fact Sheet for Patients:  EntrepreneurPulse.com.au  Fact Sheet for Healthcare Providers:  IncredibleEmployment.be  This test is no t yet approved or cleared by the [place] FDA and  has been authorized for detection and/or diagnosis of SARS-CoV-2 by FDA under an Emergency Use Authorization (EUA). This EUA will remain  in effect (meaning this test can be used) for the duration of the COVID-19 declaration under Section 564(b)(1) of the Act, 21 U.S.C.section 360bbb-3(b)(1), unless the authorization is terminated  or revoked sooner.       Influenza A by PCR NEGATIVE NEGATIVE Final   Influenza B by PCR NEGATIVE NEGATIVE Final    Comment: (NOTE) The Xpert Xpress SARS-CoV-2/FLU/RSV plus assay is intended as an aid in the diagnosis of influenza from Nasopharyngeal swab specimens and should not be used as a sole basis for treatment. Nasal washings and aspirates are unacceptable for Xpert Xpress SARS-CoV-2/FLU/RSV testing.  Fact Sheet for Patients: EntrepreneurPulse.com.au  Fact Sheet for Healthcare Providers: IncredibleEmployment.be  This test is not yet approved or cleared by the [place] FDA and has been authorized for detection and/or diagnosis of SARS-CoV-2 by FDA under an Emergency Use Authorization (EUA). This EUA will remain in effect (meaning this test can be used) for the duration of the COVID-19 declaration under Section 564(b)(1) of the Act, 21 U.S.C. section 360bbb-3(b)(1), unless the authorization is terminated or revoked.  Performed at [facility], [address] [place], [place]  [address]      Labs: BNP (last 3 results) Recent Labs    05/11/21 1628 05/12/21 1928 05/18/21 0143  BNP 3,779.1* >4,500.0* >9,629.5*   Basic Metabolic Panel: Recent Labs  Lab 05/20/21 0437 05/21/21 0522 05/22/21 0603 05/23/21 0505 05/25/21 0512  NA 130* 137 134* 135 135  K 5.6* 4.1 4.6 3.8 4.3  CL 94* 96* 95* 98 96*  CO2 _0 GLUCOSE 329* 190* 164* 176* 120*  BUN 88* 44* 59* 38* 60*  CREATININE 7.81* 4.76* 6.08* 4.23* 6.45*  CALCIUM 7.9* 8.2* 8.0* 8.3* 8.5*   Liver Function Tests: No results for input(s): AST, ALT, ALKPHOS, BILITOT, PROT, ALBUMIN in the last 168 hours. No results for input(s): LIPASE, AMYLASE in the last 168 hours. No results for input(s): AMMONIA in the last 168 hours. CBC: Recent Labs  Lab 05/22/21 0603 05/23/21 0505 05/24/21 0619 05/25/21 0512 05/26/21 0610  WBC 8.0 8.2 7.5 8.4 7.8  NEUTROABS 5.9 5.9 5.3 5.9 5.5  HGB 8.3* 7.8* 7.5* 7.5* 8.4*  HCT 26.5* 25.0* 24.2* 24.4* 27.1*  MCV 84.9 85.0 86.7 86.5 86.9  PLT 140* 133* 141* 158 190   Cardiac Enzymes: No results for input(s): CKTOTAL, CKMB, CKMBINDEX, TROPONINI in the last 168 hours. BNP: Invalid input(s): POCBNP CBG: Recent Labs  Lab 05/25/21 1607 05/25/21 2105 05/26/21 0510 05/26/21 0737 05/26/21 1108  GLUCAP 70 127* 120*  142* 137*   D-Dimer No results for input(s): DDIMER in the last 72 hours. Hgb A1c No results for input(s): HGBA1C in the last 72 hours. Lipid Profile No results for input(s): CHOL, HDL, LDLCALC, TRIG, CHOLHDL, LDLDIRECT in the last 72 hours. Thyroid function studies No results for input(s): TSH, T4TOTAL, T3FREE, THYROIDAB in the last 72 hours.  Invalid input(s): FREET3 Anemia work up No results for input(s): VITAMINB12, FOLATE, FERRITIN, TIBC, IRON, RETICCTPCT in the last 72 hours. Urinalysis    Component Value Date/Time   COLORURINE YELLOW (A) 02/06/2021 2041   APPEARANCEUR CLOUDY (A) 02/06/2021 2041   LABSPEC 1.024 02/06/2021 2041    PHURINE 7.0 02/06/2021 2041   GLUCOSEU 150 (A) 02/06/2021 2041   HGBUR MODERATE (A) 02/06/2021 2041   BILIRUBINUR NEGATIVE 02/06/2021 2041   KETONESUR NEGATIVE 02/06/2021 2041   PROTEINUR >=300 (A) 02/06/2021 2041   NITRITE NEGATIVE 02/06/2021 2041   LEUKOCYTESUR NEGATIVE 02/06/2021 2041   Sepsis Labs Invalid input(s): PROCALCITONIN,  WBC,  LACTICIDVEN Microbiology Recent Results (from the past 240 hour(s))  Resp Panel by RT-PCR (Flu A&B, Covid) Nasopharyngeal Swab     Status: None   Collection Time: 05/18/21  1:43 AM   Specimen: Nasopharyngeal Swab; Nasopharyngeal(NP) swabs in vial transport medium  Result Value Ref Range Status   SARS Coronavirus 2 by RT PCR NEGATIVE NEGATIVE Final    Comment: (NOTE) SARS-CoV-2 target nucleic acids are NOT DETECTED.  The SARS-CoV-2 RNA is generally detectable in upper respiratory specimens during the acute phase of infection. The lowest concentration of SARS-CoV-2 viral copies this assay can detect is 138 copies/mL. A negative result does not preclude SARS-Cov-2 infection and should not be used as the sole basis for treatment or other patient management decisions. A negative result may occur with  improper specimen collection/handling, submission of specimen other than nasopharyngeal swab, presence of viral mutation(s) within the areas targeted by this assay, and inadequate number of viral copies(<138 copies/mL). A negative result must be combined with clinical observations, patient history, and epidemiological information. The expected result is Negative.  Fact Sheet for Patients:  EntrepreneurPulse.com.au  Fact Sheet for Healthcare Providers:  IncredibleEmployment.be  This test is no t yet approved or cleared by the [place] FDA and  has been authorized for detection and/or diagnosis of SARS-CoV-2 by FDA under an Emergency Use Authorization (EUA). This EUA will remain  in effect (meaning this test  can be used) for the duration of the COVID-19 declaration under Section 564(b)(1) of the Act, 21 U.S.C.section 360bbb-3(b)(1), unless the authorization is terminated  or revoked sooner.       Influenza A by PCR NEGATIVE NEGATIVE Final   Influenza B by PCR NEGATIVE NEGATIVE Final    Comment: (NOTE) The Xpert Xpress SARS-CoV-2/FLU/RSV plus assay is intended as an aid in the diagnosis of influenza from Nasopharyngeal swab specimens and should not be used as a sole basis for treatment. Nasal washings and aspirates are unacceptable for Xpert Xpress SARS-CoV-2/FLU/RSV testing.  Fact Sheet for Patients: EntrepreneurPulse.com.au  Fact Sheet for Healthcare Providers: IncredibleEmployment.be  This test is not yet approved or cleared by the [place] FDA and has been authorized for detection and/or diagnosis of SARS-CoV-2 by FDA under an Emergency Use Authorization (EUA). This EUA will remain in effect (meaning this test can be used) for the duration of the COVID-19 declaration under Section 564(b)(1) of the Act, 21 U.S.C. section 360bbb-3(b)(1), unless the authorization is terminated or revoked.  Performed at [facility], [address] [place], [place] [address]   Blood culture (routine x 2)     Status: None   Collection Time: 05/18/21  1:44 AM   Specimen: BLOOD  Result Value Ref Range Status   Specimen Description BLOOD RIGHT ANTECUBITAL  Final   Special Requests   Final    BOTTLES DRAWN AEROBIC AND ANAEROBIC Blood Culture adequate volume   Culture   Final    NO GROWTH 5 DAYS Performed at [facility], [address], [place], [place] [address]    Report Status 05/23/2021 FINAL  Final  Blood culture (routine x 2)     Status: None   Collection Time: 05/18/21  1:44 AM   Specimen: BLOOD  Result Value Ref Range Status   Specimen Description BLOOD BLOOD RIGHT FOREARM  Final   Special Requests   Final    BOTTLES DRAWN  AEROBIC AND ANAEROBIC Blood Culture adequate volume   Culture   Final    NO GROWTH 5 DAYS Performed at [facility], [address] [place], [place] [address]    Report Status 05/23/2021 FINAL  Final  Acid Fast Smear (AFB)     Status: None   Collection Time: 05/18/21 11:25 AM   Specimen: PATH Cytology Pleural fluid  Result Value Ref Range Status   AFB Specimen Processing Concentration  Final   Acid Fast Smear Negative  Final    Comment: (NOTE) Performed At: [facility] [place], [place] [id number] Rush Farmer MD [id number]    Source (AFB) PLEURAL  Final    Comment: Performed at [facility], [address] [place], [place] [address]  Body fluid culture w Gram Stain     Status: None   Collection Time: 05/18/21 11:25 AM   Specimen: PATH Cytology Pleural fluid  Result Value Ref Range Status   Specimen Description   Final    PLEURAL Performed at [facility], [address], [place], [place] [address]    Special Requests   Final    NONE Performed at [facility], [address] [place], [place] [address]    Gram Stain   Final    RARE WBC PRESENT, PREDOMINANTLY MONONUCLEAR NO ORGANISMS SEEN    Culture   Final    NO GROWTH 3 DAYS Performed at [facility], [place] [address] [place], [place] [address]    Report Status 05/21/2021 FINAL  Final  Respiratory (~20 pathogens) panel by PCR     Status: None   Collection Time: 05/20/21  7:41 AM   Specimen: Nasopharyngeal Swab; Respiratory  Result Value Ref Range Status   Adenovirus NOT DETECTED NOT DETECTED Final   Coronavirus 229E NOT DETECTED NOT DETECTED Final    Comment: (NOTE) The Coronavirus on the Respiratory Panel, DOES NOT test for the novel  Coronavirus (2019 nCoV)    Coronavirus HKU1 NOT DETECTED NOT DETECTED Final   Coronavirus NL63 NOT DETECTED NOT DETECTED Final   Coronavirus OC43 NOT DETECTED NOT DETECTED Final   Metapneumovirus NOT  DETECTED NOT DETECTED Final   Rhinovirus / Enterovirus NOT DETECTED NOT DETECTED Final   Influenza A NOT DETECTED NOT DETECTED Final   Influenza B NOT DETECTED NOT DETECTED Final   Parainfluenza Virus 1 NOT DETECTED NOT DETECTED Final   Parainfluenza Virus 2 NOT DETECTED NOT DETECTED Final   Parainfluenza Virus 3 NOT DETECTED NOT DETECTED Final   Parainfluenza Virus 4 NOT DETECTED NOT DETECTED Final   Respiratory Syncytial Virus NOT DETECTED  NOT DETECTED Final   Bordetella pertussis NOT DETECTED NOT DETECTED Final   Bordetella Parapertussis NOT DETECTED NOT DETECTED Final   Chlamydophila pneumoniae NOT DETECTED NOT DETECTED Final   Mycoplasma pneumoniae NOT DETECTED NOT DETECTED Final    Comment: Performed at [facility], [place] [address] [place], [place] [address]  Resp Panel by RT-PCR (Flu A&B, Covid) Nasopharyngeal Swab     Status: None   Collection Time: 05/25/21 12:37 PM   Specimen: Nasopharyngeal Swab; Nasopharyngeal(NP) swabs in vial transport medium  Result Value Ref Range Status   SARS Coronavirus 2 by RT PCR NEGATIVE NEGATIVE Final    Comment: (NOTE) SARS-CoV-2 target nucleic acids are NOT DETECTED.  The SARS-CoV-2 RNA is generally detectable in upper respiratory specimens during the acute phase of infection. The lowest concentration of SARS-CoV-2 viral copies this assay can detect is 138 copies/mL. A negative result does not preclude SARS-Cov-2 infection and should not be used as the sole basis for treatment or other patient management decisions. A negative result may occur with  improper specimen collection/handling, submission of specimen other than nasopharyngeal swab, presence of viral mutation(s) within the areas targeted by this assay, and inadequate number of viral copies(<138 copies/mL). A negative result must be combined with clinical observations, patient history, and epidemiological information. The expected result is Negative.  Fact Sheet for Patients:   EntrepreneurPulse.com.au  Fact Sheet for Healthcare Providers:  IncredibleEmployment.be  This test is no t yet approved or cleared by the [place] FDA and  has been authorized for detection and/or diagnosis of SARS-CoV-2 by FDA under an Emergency Use Authorization (EUA). This EUA will remain  in effect (meaning this test can be used) for the duration of the COVID-19 declaration under Section 564(b)(1) of the Act, 21 U.S.C.section 360bbb-3(b)(1), unless the authorization is terminated  or revoked sooner.       Influenza A by PCR NEGATIVE NEGATIVE Final   Influenza B by PCR NEGATIVE NEGATIVE Final    Comment: (NOTE) The Xpert Xpress SARS-CoV-2/FLU/RSV plus assay is intended as an aid in the diagnosis of influenza from Nasopharyngeal swab specimens and should not be used as a sole basis for treatment. Nasal washings and aspirates are unacceptable for Xpert Xpress SARS-CoV-2/FLU/RSV testing.  Fact Sheet for Patients: EntrepreneurPulse.com.au  Fact Sheet for Healthcare Providers: IncredibleEmployment.be  This test is not yet approved or cleared by the [place] FDA and has been authorized for detection and/or diagnosis of SARS-CoV-2 by FDA under an Emergency Use Authorization (EUA). This EUA will remain in effect (meaning this test can be used) for the duration of the COVID-19 declaration under Section 564(b)(1) of the Act, 21 U.S.C. section 360bbb-3(b)(1), unless the authorization is terminated or revoked.  Performed at [facility], [address], [place], [place] [address]      Time coordinating discharge: Over 30 minutes  SIGNED:   Wyvonnia Dusky, MD  [facility] 05/26/2021, 3:20 PM Pager   If 7PM-7AM, please contact night-coverage

## 2021-05-26 NOTE — Care Management Important Message (Signed)
Important Message  Patient Details  Name: [name] MRN: [id number] Date of Birth: 1961-03-26   Medicare Important Message Given:  Yes     [name] 05/26/2021, 2:43 PM

## 2021-05-26 NOTE — Progress Notes (Signed)
Occupational Therapy Treatment Patient Details Name: [name] MRN: [id number] DOB: [birth year] Today's Date: 05/26/2021    History of present illness Patient is a 60 y.o. black male with end stage renal disease on hemodialysis, diabetes mellitus type II, hypertension, CVA, peripheral vascular disease status post bilateral BKA and ESRD on dialysis. Patient presented to ED with shortness of breath and cough. Patient had rapid response called while in hemodialysis requiring transfer to ICU. Acute respiratory failure, weaned from BiPAP. Status post left thoracentesis on 8/1. CT chest negative for PE.   OT comments  Chart reviewed, pt greeted in chair, agreeable to OT tx session. Pt requests to remain in chair. Pt educated on pressure relieving techniques with patient accepting information and demonstrating appropriate technique at start and end of session. Pt educated to perform techniques every 15-30 minutes as able. Tx session today targeted dynamic sitting balance to improve endurance and independent ADL completion. Pt performs dynamic sitting balance ADL tasks with SET UP. Pt is progressing towards goal acquisition, continues to be appropriate for ongoing skilled OT tx. Continue to recommend discharge to SAR to address functional deficits.    Follow Up Recommendations  SNF    Equipment Recommendations  Other (comment) (per next venue of care)    Recommendations for Other Services      Precautions / Restrictions Precautions Precautions: Fall Other Brace: pt has BLE prosthetics          Balance Overall balance assessment: Needs assistance Sitting-balance support: Feet supported Sitting balance-Leahy Scale: Good Sitting balance - Comments: with prosthetics at edge of chair targeting dynamic sitting balance                                   ADL either performed or assessed with clinical judgement   ADL Overall ADL's : Needs assistance/impaired     Grooming:  Wash/dry hands;Wash/dry face;Oral care;Applying deodorant;Brushing hair;Set up Grooming Details (indicate cue type and reason): at edge of chair targeting dynamic sitting balance             Lower Body Dressing: Minimal assistance Lower Body Dressing Details (indicate cue type and reason): doffing prosthetics                                       Cognition Arousal/Alertness: Awake/alert Behavior During Therapy: WFL for tasks assessed/performed Overall Cognitive Status: Within Functional Limits for tasks assessed                                 General Comments: Pt A&Ox4; follows 1-2  step directives for ADL task completion with 100% accuracy.                   Pertinent Vitals/ Pain       Pain Assessment: No/denies pain                                                          Frequency  Min 1X/week        Progress Toward Goals  OT Goals(current goals can now be found in the care  plan section)  Progress towards OT goals: Progressing toward goals     Plan Discharge plan remains appropriate;Frequency remains appropriate    AM-PAC OT "6 Clicks" Daily Activity     Outcome Measure   Help from another person eating meals?: A Little Help from another person taking care of personal grooming?: A Little Help from another person toileting, which includes using toliet, bedpan, or urinal?: A Lot Help from another person bathing (including washing, rinsing, drying)?: A Lot Help from another person to put on and taking off regular upper body clothing?: A Little Help from another person to put on and taking off regular lower body clothing?: A Little 6 Click Score: 16    End of Session    OT Visit Diagnosis: Unsteadiness on feet (R26.81);Muscle weakness (generalized) (M62.81)   Activity Tolerance Patient tolerated treatment well   Patient Left in chair;with call bell/phone within reach;with chair alarm set   Nurse  Communication Mobility status (weight shifts for pressure relief)        Time: 7824-2353 OT Time Calculation (min): 21 min  Charges: OT General Charges $OT Visit: 1 Visit OT Treatments $Self Care/Home Management : 8-22 mins  Shanon Payor, OTD OTR/L  05/26/21, 12:17 PM

## 2021-05-27 NOTE — Telephone Encounter (Signed)
Dr. Caryn Section, do you have any paperwork for this patient that you are working on?

## 2021-05-27 NOTE — Telephone Encounter (Signed)
I don't think so. What kind of paperwork?

## 2021-05-27 NOTE — Progress Notes (Deleted)
Cardiology Office Note    Date:  05/27/2021   ID:  [name], DOB 1961-02-13, MRN [id number]  PCP:  [name], MD  Cardiologist:  Nelva Bush, MD  Electrophysiologist:  None   Chief Complaint: Hospital follow-up  History of Present Illness:   [name] is a 60 y.o. male with history of HFrEF of uncertain etiology, ESRD on HD (MWF), CVA, ICH, DM2, seizure disorder, PAD status post bilateral BKA, DVT, HTN, HLD, anemia of chronic disease, marijuana use, and hidradenitis suppurativa who presents for hospital follow-up as outlined below.  Prior echo in 08/2020 demonstrated an EF of 40 to 45%, global hypokinesis, moderate concentric LVH, grade 2 diastolic dysfunction, mildly reduced RV systolic function with normal ventricular cavity size, mild biatrial enlargement, trivial mitral regurgitation, mild aortic valve sclerosis without evidence of stenosis, mild dilatation of the ascending aorta measuring 40 mm, and an estimated right atrial pressure of 8 mmHg.  He was admitted in late 04/2021 with shortness of breath.  BNP was severely elevated at greater than 4500.  Echo at that time demonstrated an EF of 30 to 35%, global hypokinesis, mild LVH, mildly dilated internal LV cavity size, grade 2 diastolic dysfunction, moderately reduced RV systolic function with moderately enlarged RV cavity size, normal PASP, severely dilated left atrium, large pleural effusion on the left lateral region, mild to moderate mitral regurgitation, moderate mitral annular calcification, moderate tricuspid regurgitation, mild to moderate aortic valve sclerosis without evidence of stenosis, and an estimated right atrial pressure of 8 mmHg.  No further work-up was pursued.  He was readmitted to the hospital from 8/1 through 8/9 with acute hypoxic respiratory failure briefly requiring BiPAP, despite adherence with hemodialysis, in the context of volume overload, worsening anemia of chronic disease.  Admission  was notable for a drop in hemoglobin to 5.8 requiring 1 unit of PRBC with discharge hemoglobin low though stable at 8.4.  He underwent thoracentesis for the above previously mentioned large left pleural effusion with 1 L of fluid removed.  Cytology was notable for a lymphocyte predominant effusion.  AFB smear was negative.  AFB culture remains in process.  High-sensitivity troponin peaked at 34.  BNP greater than 4500, which was unchanged when compared to his prior recent admission.  CTA of the chest was negative for PE.  With regards to his cardiomyopathy, he was placed on maximally tolerated GDMT.  During his admission, he was not felt to be a candidate for diagnostic cardiac cath given ongoing, transfusion dependent, anemia.     Outpatient ischemic evaluation to be considered***   ***   Labs independently reviewed: 05/2021 - Hgb 8.4, PLT 190, potassium 4.3, BUN 60, serum creatinine 6.45, A1c 6.0 04/2019 - TSH normal, albumin 3.3, AST/ALT normal  Past Medical History:  Diagnosis Date   Acute metabolic encephalopathy 0/0/9381   Anemia    Crohn disease ([place])    Diabetes mellitus without complication ([place])    DVT of lower extremity (deep venous thrombosis) ([place]) 2016   Empyema ([place]) 05/20/2017   Encephalopathy 12/04/2017   Fall at home, initial encounter 09/12/2020   Hidradenitis suppurativa    Hypertension    ICH (intracerebral hemorrhage) ([place])    Peritonitis ([place]) 04/21/2017   Pyogenic arthritis of knee ([place]) 02/04/2016   Renal disorder    Sepsis ([place]) 01/12/2018   Stroke [facility])     Past Surgical History:  Procedure Laterality Date   A/V FISTULAGRAM Left 02/24/2021   Procedure: A/V FISTULAGRAM;  Surgeon:  Schnier, Dolores Lory, MD;  Location: [place] [facility];  Service: Cardiovascular;  Laterality: Left;   ABDOMINAL SURGERY     AMPUTATION FINGER Left 06/2019   PR AMPUTATION LONG FINGER/THUMB+FLAPS [facility]   ANGIOPLASTY Left    left [facility]pop [facility] 04-11-2018   BELOW KNEE LEG AMPUTATION  Right 08/2017   [facility]   COLONOSCOPY     COLONOSCOPY WITH PROPOFOL N/A 10/28/2020   Procedure: COLONOSCOPY WITH PROPOFOL;  Surgeon: Lin Landsman, MD;  Location: [place];  Service: Gastroenterology;  Laterality: N/A;   COLONOSCOPY WITH PROPOFOL N/A 11/21/2020   Procedure: COLONOSCOPY WITH PROPOFOL;  Surgeon: [name], MD;  Location: [facility] ENDOSCOPY;  Service: Endoscopy;  Laterality: N/A;   DIALYSIS/PERMA CATHETER INSERTION N/A 12/09/2017   Procedure: DIALYSIS/PERMA CATHETER INSERTION;  Surgeon: Katha Cabal, MD;  Location: [place] [facility];  Service: Cardiovascular;  Laterality: N/A;   DIALYSIS/PERMA CATHETER INSERTION N/A 12/12/2017   Procedure: DIALYSIS/PERMA CATHETER INSERTION;  Surgeon: Algernon Huxley, MD;  Location: [place] [facility];  Service: Cardiovascular;  Laterality: N/A;   DIALYSIS/PERMA CATHETER REMOVAL Left 12/09/2017   Procedure: DIALYSIS/PERMA CATHETER REMOVAL;  Surgeon: Katha Cabal, MD;  Location: [place] [facility];  Service: Cardiovascular;  Laterality: Left;   ESOPHAGOGASTRODUODENOSCOPY (EGD) WITH PROPOFOL N/A 11/20/2020   Procedure: ESOPHAGOGASTRODUODENOSCOPY (EGD) WITH PROPOFOL;  Surgeon: [name], MD;  Location: [facility] ENDOSCOPY;  Service: Endoscopy;  Laterality: N/A;   KNEE SURGERY Left 02/04/2016   [facility]   LEG AMPUTATION THROUGH LOWER TIBIA AND FIBULA Left 06/22/2018   [facility]   LOWER EXTREMITY ANGIOGRAPHY Right 08/08/2017   Procedure: Lower Extremity Angiography;  Surgeon: Algernon Huxley, MD;  Location: [place] [facility];  Service: Cardiovascular;  Laterality: Right;   LOWER EXTREMITY ANGIOGRAPHY Right 08/22/2017   Procedure: Lower Extremity Angiography;  Surgeon: Algernon Huxley, MD;  Location: [place] [facility];  Service: Cardiovascular;  Laterality: Right;   LOWER EXTREMITY INTERVENTION  08/08/2017   Procedure: LOWER EXTREMITY INTERVENTION;  Surgeon: Algernon Huxley, MD;  Location: [place] [facility];  Service: Cardiovascular;;   LOWER  EXTREMITY INTERVENTION  08/22/2017   Procedure: LOWER EXTREMITY INTERVENTION;  Surgeon: Algernon Huxley, MD;  Location: [place] [facility];  Service: Cardiovascular;;    Current Medications: No outpatient medications have been marked as taking for the 05/28/21 encounter (Appointment) with Rise Mu, PA-C.    Allergies:   Methotrexate, Vancomycin, Cefepime, and Tape   Social History   Socioeconomic History   Marital status: Married    Spouse name: Not on file   Number of children: 2   Years of education: Not on file   Highest education level: Associate degree: occupational, Hotel manager, or vocational program  Occupational History   Occupation: disable  Tobacco Use   Smoking status: Former   Smokeless tobacco: Never   Tobacco comments:    smokes marijuana  Vaping Use   Vaping Use: Never used  Substance and Sexual Activity   Alcohol use: No   Drug use: Yes    Types: Marijuana   Sexual activity: Not Currently  Other Topics Concern   Not on file  Social History Narrative   Not on file   Social Determinants of Health   Financial Resource Strain: Low Risk    Difficulty of Paying Living Expenses: Not hard at all  Food Insecurity: No Food Insecurity   Worried About Charity fundraiser in the Last Year: Never true   [employer]  in the Last Year: Never true  Transportation Needs: No Transportation Needs   Lack of Transportation (Medical): No   Lack of Transportation (Non-Medical): No  Physical Activity: Inactive   Days of Exercise per Week: 0 days   Minutes of Exercise per Session: 0 min  Stress: No Stress Concern Present   Feeling of Stress : Only a little  Social Connections: Moderately Isolated   Frequency of Communication with Friends and Family: Twice a week   Frequency of Social Gatherings with Friends and Family: Three times a week   Attends Religious Services: Never   Active Member of Clubs or Organizations: No   Attends Archivist Meetings: Never    Marital Status: Married     Family History:  The patient's family history includes Arthritis in his brother; Diabetes in his mother, sister, sister, and sister; Heart disease in his father and mother; Irritable bowel syndrome in his sister; Psoriasis in his brother; Rheumatic fever in his father.  ROS:   ROS   EKGs/Labs/Other Studies Reviewed:    Studies reviewed were summarized above. The additional studies were reviewed today:  2D echo 05/13/2021: 1. Left ventricular ejection fraction, by estimation, is 30 to 35%. The  left ventricle has moderately decreased function. The left ventricle  demonstrates global hypokinesis. The left ventricular internal cavity size  was mildly dilated. There is mild  left ventricular hypertrophy. Left ventricular diastolic parameters are  consistent with Grade II diastolic dysfunction (pseudonormalization).  Elevated left atrial pressure.   2. Right ventricular systolic function is moderately reduced. The right  ventricular size is moderately enlarged. There is normal pulmonary artery  systolic pressure.   3. Left atrial size was severely dilated.   4. Large pleural effusion in the left lateral region.   5. The mitral valve is abnormal. Mild to moderate mitral valve  regurgitation. No evidence of mitral stenosis. Moderate mitral annular  calcification.   6. Tricuspid valve regurgitation is moderate.   7. The aortic valve is tricuspid. There is moderate calcification of the  aortic valve. There is moderate thickening of the aortic valve. Aortic  valve regurgitation is not visualized. Mild to moderate aortic valve  sclerosis/calcification is present,  without any evidence of aortic stenosis.   8. The inferior vena cava is normal in size with <50% respiratory  variability, suggesting right atrial pressure of 8 mmHg. __________  2D echo 09/13/2020:  1. Left ventricular ejection fraction, by estimation, is 40 to 45%. The  left ventricle has mildly  decreased function. The left ventricle  demonstrates global hypokinesis. There is moderate concentric left  ventricular hypertrophy. Left ventricular  diastolic parameters are consistent with Grade II diastolic dysfunction  (pseudonormalization). Elevated left atrial pressure.   2. Right ventricular systolic function is mildly reduced. The right  ventricular size is normal. Tricuspid regurgitation signal is inadequate  for assessing PA pressure.   3. Left atrial size was mildly dilated.   4. Right atrial size was mildly dilated.   5. The mitral valve is normal in structure. Trivial mitral valve  regurgitation.   6. The aortic valve is tricuspid. There is mild calcification of the  aortic valve. There is mild thickening of the aortic valve. Aortic valve  regurgitation is not visualized. Mild aortic valve sclerosis is present,  with no evidence of aortic valve  stenosis.   7. Aortic dilatation noted. There is mild dilatation of the ascending  aorta, measuring 40 mm.   8. The inferior vena cava is  normal in size with <50% respiratory  variability, suggesting right atrial pressure of 8 mmHg.   EKG:  EKG is ordered today.  The EKG ordered today demonstrates ***  Recent Labs: 02/07/2021: Magnesium 1.6 05/11/2021: ALT 4; TSH 2.280 05/18/2021: B Natriuretic Peptide >4,500.0 05/25/2021: BUN 60; Creatinine, Ser 6.45; Potassium 4.3; Sodium 135 05/26/2021: Hemoglobin 8.4; Platelets 190  Recent Lipid Panel    Component Value Date/Time   CHOL 109 09/13/2020 0325   CHOL 134 05/27/2020 1006   TRIG 90 09/13/2020 0325   HDL 40 (L) 09/13/2020 0325   HDL 62 05/27/2020 1006   CHOLHDL 2.7 09/13/2020 0325   VLDL 18 09/13/2020 0325   LDLCALC 51 09/13/2020 0325   LDLCALC 56 05/27/2020 1006    PHYSICAL EXAM:    VS:  There were no vitals taken for this visit.  BMI: There is no height or weight on file to calculate BMI.  Physical Exam  Wt Readings from Last 3 Encounters:  05/26/21 132 lb 7.9 oz (60.1  kg)  05/15/21 137 lb 11.2 oz (62.5 kg)  05/11/21 140 lb (63.5 kg)     ASSESSMENT & PLAN:   HFrEF with history of left-sided pleural effusion status post thoracentesis on 05/18/2021:  HTN: Blood pressure ***  ESRD on HD:  Anemia of chronic disease: Status post 1 unit PRBC during recent admission.  History of elevated troponin:  Disposition: F/u with Dr. Saunders Revel or an APP in ***.   Medication Adjustments/Labs and Tests Ordered: Current medicines are reviewed at length with the patient today.  Concerns regarding medicines are outlined above. Medication changes, Labs and Tests ordered today are summarized above and listed in the Patient Instructions accessible in Encounters.   Signed, Christell Faith, PA-C 05/27/2021 7:33 AM     [facility] [address] [place] [address] [place], [place] [address] [phone]

## 2021-05-28 ENCOUNTER — Ambulatory Visit: Payer: Medicare Other | Admitting: Physician Assistant

## 2021-05-29 NOTE — Telephone Encounter (Signed)
Patient's wife says that she has FMLA paperwork that she needs [place] to fill out because the hospital discharged the patient to [facility]. She will need to take sometime off from work. She hasn't dropped the forms off yet. Also, the patient has a HFU scheduled w/Dr. Rosanna Randy on next Thursday (8/18) is this appt needed? Please advise. Thanks!

## 2021-05-29 NOTE — Telephone Encounter (Signed)
He needs to see an MD next week. If he is in assisted living at [facility] then there should be a staff MD that will be seeing. If so then he doesn't need to see Dr. Rosanna Randy next week.  She needs to let us know exactly what days she will be out of work when she brings the FMLA forms.

## 2021-05-30 ENCOUNTER — Other Ambulatory Visit: Payer: Self-pay | Admitting: Family Medicine

## 2021-05-30 NOTE — Telephone Encounter (Signed)
Requested medications are due for refill today no, not on current med list  Requested medications are on the active medication list no  Last refill 03/02/21  Last visit 11/2020  Future visit scheduled 06/04/21  Notes to clinic Med not on current med list.

## 2021-06-04 ENCOUNTER — Inpatient Hospital Stay: Payer: Medicare Other | Admitting: Family Medicine

## 2021-06-09 ENCOUNTER — Inpatient Hospital Stay: Payer: Medicare Other

## 2021-06-09 ENCOUNTER — Telehealth: Payer: Self-pay | Admitting: Family Medicine

## 2021-06-09 ENCOUNTER — Emergency Department: Payer: Medicare Other

## 2021-06-09 ENCOUNTER — Inpatient Hospital Stay
Admission: EM | Admit: 2021-06-09 | Discharge: 2021-06-13 | DRG: 871 | Disposition: A | Discharge status: Skilled Nursing Facility | Payer: Medicare Other | Attending: Internal Medicine | Admitting: Internal Medicine

## 2021-06-09 ENCOUNTER — Other Ambulatory Visit: Payer: Self-pay

## 2021-06-09 DIAGNOSIS — Z91048 Other nonmedicinal substance allergy status: Secondary | ICD-10-CM

## 2021-06-09 DIAGNOSIS — Z89022 Acquired absence of left finger(s): Secondary | ICD-10-CM

## 2021-06-09 DIAGNOSIS — Z89511 Acquired absence of right leg below knee: Secondary | ICD-10-CM

## 2021-06-09 DIAGNOSIS — E1122 Type 2 diabetes mellitus with diabetic chronic kidney disease: Secondary | ICD-10-CM | POA: Diagnosis present

## 2021-06-09 DIAGNOSIS — R680 Hypothermia, not associated with low environmental temperature: Secondary | ICD-10-CM | POA: Diagnosis present

## 2021-06-09 DIAGNOSIS — T68XXXA Hypothermia, initial encounter: Secondary | ICD-10-CM

## 2021-06-09 DIAGNOSIS — Y95 Nosocomial condition: Secondary | ICD-10-CM | POA: Diagnosis present

## 2021-06-09 DIAGNOSIS — I5022 Chronic systolic (congestive) heart failure: Secondary | ICD-10-CM | POA: Diagnosis present

## 2021-06-09 DIAGNOSIS — Z20822 Contact with and (suspected) exposure to covid-19: Secondary | ICD-10-CM | POA: Diagnosis present

## 2021-06-09 DIAGNOSIS — J9601 Acute respiratory failure with hypoxia: Secondary | ICD-10-CM | POA: Diagnosis present

## 2021-06-09 DIAGNOSIS — I5042 Chronic combined systolic (congestive) and diastolic (congestive) heart failure: Secondary | ICD-10-CM | POA: Diagnosis present

## 2021-06-09 DIAGNOSIS — F0150 Vascular dementia without behavioral disturbance: Secondary | ICD-10-CM | POA: Diagnosis present

## 2021-06-09 DIAGNOSIS — N401 Enlarged prostate with lower urinary tract symptoms: Secondary | ICD-10-CM | POA: Diagnosis present

## 2021-06-09 DIAGNOSIS — Z833 Family history of diabetes mellitus: Secondary | ICD-10-CM

## 2021-06-09 DIAGNOSIS — N2581 Secondary hyperparathyroidism of renal origin: Secondary | ICD-10-CM | POA: Diagnosis present

## 2021-06-09 DIAGNOSIS — Z888 Allergy status to other drugs, medicaments and biological substances status: Secondary | ICD-10-CM

## 2021-06-09 DIAGNOSIS — D631 Anemia in chronic kidney disease: Secondary | ICD-10-CM | POA: Diagnosis present

## 2021-06-09 DIAGNOSIS — Z992 Dependence on renal dialysis: Secondary | ICD-10-CM

## 2021-06-09 DIAGNOSIS — I132 Hypertensive heart and chronic kidney disease with heart failure and with stage 5 chronic kidney disease, or end stage renal disease: Secondary | ICD-10-CM | POA: Diagnosis present

## 2021-06-09 DIAGNOSIS — E1165 Type 2 diabetes mellitus with hyperglycemia: Secondary | ICD-10-CM | POA: Diagnosis present

## 2021-06-09 DIAGNOSIS — E162 Hypoglycemia, unspecified: Secondary | ICD-10-CM | POA: Diagnosis present

## 2021-06-09 DIAGNOSIS — K5090 Crohn's disease, unspecified, without complications: Secondary | ICD-10-CM | POA: Diagnosis present

## 2021-06-09 DIAGNOSIS — N186 End stage renal disease: Secondary | ICD-10-CM | POA: Diagnosis present

## 2021-06-09 DIAGNOSIS — Z86718 Personal history of other venous thrombosis and embolism: Secondary | ICD-10-CM

## 2021-06-09 DIAGNOSIS — G9341 Metabolic encephalopathy: Secondary | ICD-10-CM | POA: Diagnosis present

## 2021-06-09 DIAGNOSIS — I679 Cerebrovascular disease, unspecified: Secondary | ICD-10-CM | POA: Diagnosis present

## 2021-06-09 DIAGNOSIS — I69398 Other sequelae of cerebral infarction: Secondary | ICD-10-CM

## 2021-06-09 DIAGNOSIS — E1151 Type 2 diabetes mellitus with diabetic peripheral angiopathy without gangrene: Secondary | ICD-10-CM | POA: Diagnosis present

## 2021-06-09 DIAGNOSIS — Z79899 Other long term (current) drug therapy: Secondary | ICD-10-CM

## 2021-06-09 DIAGNOSIS — E43 Unspecified severe protein-calorie malnutrition: Secondary | ICD-10-CM | POA: Diagnosis present

## 2021-06-09 DIAGNOSIS — A419 Sepsis, unspecified organism: Secondary | ICD-10-CM | POA: Diagnosis present

## 2021-06-09 DIAGNOSIS — E11649 Type 2 diabetes mellitus with hypoglycemia without coma: Secondary | ICD-10-CM | POA: Diagnosis present

## 2021-06-09 DIAGNOSIS — J189 Pneumonia, unspecified organism: Secondary | ICD-10-CM | POA: Diagnosis present

## 2021-06-09 DIAGNOSIS — E785 Hyperlipidemia, unspecified: Secondary | ICD-10-CM | POA: Diagnosis present

## 2021-06-09 DIAGNOSIS — I69319 Unspecified symptoms and signs involving cognitive functions following cerebral infarction: Secondary | ICD-10-CM | POA: Diagnosis not present

## 2021-06-09 DIAGNOSIS — Z681 Body mass index (BMI) 19 or less, adult: Secondary | ICD-10-CM

## 2021-06-09 DIAGNOSIS — Z87891 Personal history of nicotine dependence: Secondary | ICD-10-CM

## 2021-06-09 DIAGNOSIS — L732 Hidradenitis suppurativa: Secondary | ICD-10-CM | POA: Diagnosis present

## 2021-06-09 DIAGNOSIS — G40909 Epilepsy, unspecified, not intractable, without status epilepticus: Secondary | ICD-10-CM | POA: Diagnosis present

## 2021-06-09 DIAGNOSIS — Z881 Allergy status to other antibiotic agents status: Secondary | ICD-10-CM

## 2021-06-09 DIAGNOSIS — Z89512 Acquired absence of left leg below knee: Secondary | ICD-10-CM

## 2021-06-09 DIAGNOSIS — Z7982 Long term (current) use of aspirin: Secondary | ICD-10-CM

## 2021-06-09 DIAGNOSIS — R6520 Severe sepsis without septic shock: Principal | ICD-10-CM

## 2021-06-09 DIAGNOSIS — Z8261 Family history of arthritis: Secondary | ICD-10-CM

## 2021-06-09 DIAGNOSIS — Z8673 Personal history of transient ischemic attack (TIA), and cerebral infarction without residual deficits: Secondary | ICD-10-CM

## 2021-06-09 DIAGNOSIS — I5043 Acute on chronic combined systolic (congestive) and diastolic (congestive) heart failure: Secondary | ICD-10-CM | POA: Diagnosis present

## 2021-06-09 LAB — CBC WITH DIFFERENTIAL/PLATELET
Abs Immature Granulocytes: 0.12 10*3/uL — ABNORMAL HIGH (ref 0.00–0.07)
Basophils Absolute: 0 10*3/uL (ref 0.0–0.1)
Basophils Relative: 0 %
Eosinophils Absolute: 0 10*3/uL (ref 0.0–0.5)
Eosinophils Relative: 0 %
HCT: 28.4 % — ABNORMAL LOW (ref 39.0–52.0)
Hemoglobin: 8.5 g/dL — ABNORMAL LOW (ref 13.0–17.0)
Immature Granulocytes: 1 %
Lymphocytes Relative: 5 %
Lymphs Abs: 0.6 10*3/uL — ABNORMAL LOW (ref 0.7–4.0)
MCH: 27.2 pg (ref 26.0–34.0)
MCHC: 29.9 g/dL — ABNORMAL LOW (ref 30.0–36.0)
MCV: 90.7 fL (ref 80.0–100.0)
Monocytes Absolute: 0.2 10*3/uL (ref 0.1–1.0)
Monocytes Relative: 2 %
Neutro Abs: 10.6 10*3/uL — ABNORMAL HIGH (ref 1.7–7.7)
Neutrophils Relative %: 92 %
Platelets: 232 10*3/uL (ref 150–400)
RBC: 3.13 MIL/uL — ABNORMAL LOW (ref 4.22–5.81)
RDW: 19.6 % — ABNORMAL HIGH (ref 11.5–15.5)
WBC: 11.6 10*3/uL — ABNORMAL HIGH (ref 4.0–10.5)
nRBC: 0 % (ref 0.0–0.2)

## 2021-06-09 LAB — COMPREHENSIVE METABOLIC PANEL
ALT: 13 U/L (ref 0–44)
AST: 22 U/L (ref 15–41)
Albumin: 2.8 g/dL — ABNORMAL LOW (ref 3.5–5.0)
Alkaline Phosphatase: 74 U/L (ref 38–126)
Anion gap: 10 (ref 5–15)
BUN: 27 mg/dL — ABNORMAL HIGH (ref 6–20)
CO2: 31 mmol/L (ref 22–32)
Calcium: 9 mg/dL (ref 8.9–10.3)
Chloride: 95 mmol/L — ABNORMAL LOW (ref 98–111)
Creatinine, Ser: 3.62 mg/dL — ABNORMAL HIGH (ref 0.61–1.24)
GFR, Estimated: 19 mL/min — ABNORMAL LOW (ref >60)
Glucose, Bld: 106 mg/dL — ABNORMAL HIGH (ref 70–99)
Potassium: 3.6 mmol/L (ref 3.5–5.1)
Sodium: 136 mmol/L (ref 135–145)
Total Bilirubin: 0.4 mg/dL (ref 0.3–1.2)
Total Protein: 7.2 g/dL (ref 6.5–8.1)

## 2021-06-09 LAB — RESP PANEL BY RT-PCR (FLU A&B, COVID) ARPGX2
Influenza A by PCR: NEGATIVE
Influenza B by PCR: NEGATIVE
SARS Coronavirus 2 by RT PCR: NEGATIVE

## 2021-06-09 LAB — URINALYSIS, COMPLETE (UACMP) WITH MICROSCOPIC
Bilirubin Urine: NEGATIVE
Glucose, UA: 50 mg/dL — AB
Hgb urine dipstick: NEGATIVE
Ketones, ur: NEGATIVE mg/dL
Nitrite: NEGATIVE
Protein, ur: >=300 mg/dL — AB
Specific Gravity, Urine: 1.012 (ref 1.005–1.030)
pH: 7 (ref 5.0–8.0)

## 2021-06-09 LAB — EXPECTORATED SPUTUM ASSESSMENT W GRAM STAIN, RFLX TO RESP C

## 2021-06-09 LAB — BLOOD GAS, VENOUS
Acid-Base Excess: 7 mmol/L — ABNORMAL HIGH (ref 0.0–2.0)
Bicarbonate: 33.4 mmol/L — ABNORMAL HIGH (ref 20.0–28.0)
O2 Saturation: 70.6 %
Patient temperature: 37
pCO2, Ven: 54 mmHg (ref 44.0–60.0)
pH, Ven: 7.4 (ref 7.250–7.430)
pO2, Ven: 37 mmHg (ref 32.0–45.0)

## 2021-06-09 LAB — CBG MONITORING, ED
Glucose-Capillary: 102 mg/dL — ABNORMAL HIGH (ref 70–99)
Glucose-Capillary: 59 mg/dL — ABNORMAL LOW (ref 70–99)
Glucose-Capillary: 103 mg/dL — ABNORMAL HIGH (ref 70–99)
Glucose-Capillary: 105 mg/dL — ABNORMAL HIGH (ref 70–99)
Glucose-Capillary: 124 mg/dL — ABNORMAL HIGH (ref 70–99)

## 2021-06-09 LAB — PROCALCITONIN: Procalcitonin: 0.35 ng/mL

## 2021-06-09 LAB — LACTIC ACID, PLASMA
Lactic Acid, Venous: 1 mmol/L (ref 0.5–1.9)
Lactic Acid, Venous: 0.6 mmol/L (ref 0.5–1.9)

## 2021-06-09 LAB — TROPONIN I (HIGH SENSITIVITY)
Troponin I (High Sensitivity): 31 ng/L — ABNORMAL HIGH (ref <18)
Troponin I (High Sensitivity): 38 ng/L — ABNORMAL HIGH (ref <18)

## 2021-06-09 LAB — MAGNESIUM: Magnesium: 1.8 mg/dL (ref 1.7–2.4)

## 2021-06-09 MED ORDER — ACETAMINOPHEN 500 MG PO TABS: 1000.0000 mg | ORAL_TABLET | Freq: Every day | ORAL | Status: DC | PRN

## 2021-06-09 MED ORDER — HYDRALAZINE HCL 50 MG PO TABS
100.0000 mg | ORAL_TABLET | Freq: Three times a day (TID) | ORAL | Status: DC
  Administered 2021-06-10 – 2021-06-13 (×11): 100 mg via ORAL
  Filled 2021-06-09 (×11): qty 2

## 2021-06-09 MED ORDER — ASPIRIN 81 MG PO CHEW
81.0000 mg | CHEWABLE_TABLET | Freq: Every day | ORAL | Status: DC
  Administered 2021-06-10 – 2021-06-13 (×4): 81 mg via ORAL
  Filled 2021-06-09 (×4): qty 1

## 2021-06-09 MED ORDER — DEXTROSE-NACL 10-0.45 % IV SOLN
INTRAVENOUS | Status: DC
  Filled 2021-06-09 (×2): qty 1000

## 2021-06-09 MED ORDER — DEXTROSE 50 % IV SOLN
1.0000 | Freq: Once | INTRAVENOUS | Status: AC
  Administered 2021-06-09: 50 mL via INTRAVENOUS
  Filled 2021-06-09: qty 50

## 2021-06-09 MED ORDER — ACETAMINOPHEN 500 MG PO TABS
1000.0000 mg | ORAL_TABLET | Freq: Three times a day (TID) | ORAL | Status: DC | PRN
  Administered 2021-06-10 – 2021-06-13 (×3): 1000 mg via ORAL
  Filled 2021-06-09 (×3): qty 2

## 2021-06-09 MED ORDER — CARVEDILOL 25 MG PO TABS: 25.0000 mg | ORAL_TABLET | Freq: Two times a day (BID) | ORAL | Status: DC

## 2021-06-09 MED ORDER — LEVETIRACETAM 250 MG PO TABS
250.0000 mg | ORAL_TABLET | ORAL | Status: DC
  Administered 2021-06-10 – 2021-06-12 (×2): 250 mg via ORAL
  Filled 2021-06-09 (×2): qty 1

## 2021-06-09 MED ORDER — ATORVASTATIN CALCIUM 80 MG PO TABS
80.0000 mg | ORAL_TABLET | Freq: Every day | ORAL | Status: DC
  Administered 2021-06-10 – 2021-06-13 (×4): 80 mg via ORAL
  Filled 2021-06-09 (×4): qty 1

## 2021-06-09 MED ORDER — SODIUM CHLORIDE 0.9 % IV SOLN
2.0000 g | INTRAVENOUS | Status: AC
  Administered 2021-06-09 – 2021-06-13 (×5): 2 g via INTRAVENOUS
  Filled 2021-06-09: qty 20
  Filled 2021-06-09 (×2): qty 2
  Filled 2021-06-09 (×2): qty 20

## 2021-06-09 MED ORDER — ONDANSETRON HCL 4 MG PO TABS: 4.0000 mg | ORAL_TABLET | Freq: Four times a day (QID) | ORAL | Status: DC | PRN

## 2021-06-09 MED ORDER — PIPERACILLIN-TAZOBACTAM 3.375 G IVPB 30 MIN
3.3750 g | Freq: Once | INTRAVENOUS | Status: AC
  Administered 2021-06-09: 3.375 g via INTRAVENOUS
  Filled 2021-06-09: qty 50

## 2021-06-09 MED ORDER — HEPARIN SODIUM (PORCINE) 5000 UNIT/ML IJ SOLN
5000.0000 [IU] | Freq: Three times a day (TID) | INTRAMUSCULAR | Status: DC
  Administered 2021-06-09 – 2021-06-13 (×12): 5000 [IU] via SUBCUTANEOUS
  Filled 2021-06-09 (×12): qty 1

## 2021-06-09 MED ORDER — SEVELAMER CARBONATE 800 MG PO TABS
800.0000 mg | ORAL_TABLET | Freq: Three times a day (TID) | ORAL | Status: DC
  Administered 2021-06-09 – 2021-06-13 (×10): 800 mg via ORAL
  Filled 2021-06-09 (×12): qty 1

## 2021-06-09 MED ORDER — SODIUM CHLORIDE 0.9 % IV SOLN
500.0000 mg | INTRAVENOUS | Status: AC
  Administered 2021-06-10 – 2021-06-13 (×4): 500 mg via INTRAVENOUS
  Filled 2021-06-09 (×4): qty 500

## 2021-06-09 MED ORDER — ONDANSETRON HCL 4 MG/2ML IJ SOLN: 4.0000 mg | Freq: Four times a day (QID) | INTRAMUSCULAR | Status: DC | PRN

## 2021-06-09 MED ORDER — GABAPENTIN 400 MG PO CAPS
400.0000 mg | ORAL_CAPSULE | Freq: Every day | ORAL | Status: DC
  Administered 2021-06-10 – 2021-06-12 (×3): 400 mg via ORAL
  Filled 2021-06-09 (×3): qty 1

## 2021-06-09 MED ORDER — ACETAMINOPHEN 325 MG PO TABS
650.0000 mg | ORAL_TABLET | Freq: Four times a day (QID) | ORAL | Status: DC | PRN
  Administered 2021-06-11 – 2021-06-12 (×2): 650 mg via ORAL
  Filled 2021-06-09 (×2): qty 2

## 2021-06-09 MED ORDER — CARVEDILOL 25 MG PO TABS
25.0000 mg | ORAL_TABLET | Freq: Two times a day (BID) | ORAL | Status: DC
  Administered 2021-06-09 – 2021-06-13 (×7): 25 mg via ORAL
  Filled 2021-06-09 (×7): qty 1

## 2021-06-09 MED ORDER — LEVETIRACETAM 500 MG PO TABS
500.0000 mg | ORAL_TABLET | Freq: Every day | ORAL | Status: DC
  Administered 2021-06-10 – 2021-06-13 (×4): 500 mg via ORAL
  Filled 2021-06-09 (×4): qty 1

## 2021-06-09 MED ORDER — ENSURE ENLIVE PO LIQD
237.0000 mL | Freq: Three times a day (TID) | ORAL | Status: DC
  Administered 2021-06-09 – 2021-06-13 (×8): 237 mL via ORAL

## 2021-06-09 MED ORDER — CHLORHEXIDINE GLUCONATE CLOTH 2 % EX PADS
6.0000 | MEDICATED_PAD | Freq: Every day | CUTANEOUS | Status: DC
  Administered 2021-06-11 – 2021-06-13 (×3): 6 via TOPICAL
  Filled 2021-06-09: qty 6

## 2021-06-09 MED ORDER — LISINOPRIL 20 MG PO TABS
40.0000 mg | ORAL_TABLET | Freq: Every day | ORAL | Status: DC
  Administered 2021-06-11 – 2021-06-13 (×3): 40 mg via ORAL
  Filled 2021-06-09 (×3): qty 2

## 2021-06-09 MED ORDER — GABAPENTIN 100 MG PO CAPS: 100.0000 mg | ORAL_CAPSULE | Freq: Every day | ORAL | Status: DC

## 2021-06-09 MED ORDER — RENA-VITE PO TABS
1.0000 | ORAL_TABLET | Freq: Every day | ORAL | Status: DC
  Administered 2021-06-10 – 2021-06-12 (×3): 1 via ORAL
  Filled 2021-06-09 (×5): qty 1

## 2021-06-09 MED ORDER — SODIUM CHLORIDE 0.9 % IV SOLN
500.0000 mg | Freq: Once | INTRAVENOUS | Status: AC
  Administered 2021-06-09: 500 mg via INTRAVENOUS
  Filled 2021-06-09: qty 500

## 2021-06-09 MED ORDER — SPIRONOLACTONE 25 MG PO TABS
25.0000 mg | ORAL_TABLET | Freq: Every day | ORAL | Status: DC
  Administered 2021-06-11 – 2021-06-13 (×3): 25 mg via ORAL
  Filled 2021-06-09 (×3): qty 1

## 2021-06-09 MED ORDER — ISOSORBIDE DINITRATE 20 MG PO TABS: 20.0000 mg | ORAL_TABLET | Freq: Three times a day (TID) | ORAL | Status: DC

## 2021-06-09 MED ORDER — ACETAMINOPHEN 650 MG RE SUPP: 650.0000 mg | Freq: Four times a day (QID) | RECTAL | Status: DC | PRN

## 2021-06-09 NOTE — Progress Notes (Signed)
[facility]  ROUNDING NOTE   Subjective:   [name] was admitted to [facility] on 06/09/2021 for alt mental status ems  Last hemodialysis treatment was yesterday. Coming off treatment early.   Wife at bedside.   Patient found to have hypoglycemia and pneumonia.    Objective:  Vital signs in last 24 hours:  Temp:  [92.7 F (33.7 C)] 92.7 F (33.7 C) (08/23 0657) Pulse Rate:  [57-67] 67 (08/23 0939) Resp:  [14-27] 16 (08/23 0939) BP: (134-170)/(61-77) 142/61 (08/23 0939) SpO2:  [94 %-98 %] 96 % (08/23 0939) Weight:  [60.1 kg] 60.1 kg (08/23 0654)  Weight change:  Filed Weights   06/09/21 0654  Weight: 60.1 kg    Intake/Output: No intake/output data recorded.   Intake/Output this shift:  Total I/O In: 240.9 [IV Piggyback:240.9] Out: -   Physical Exam: General: NAD,   Head: Normocephalic, atraumatic. Moist oral mucosal membranes  Eyes: Anicteric, PERRL  Neck: Supple, trachea midline  Lungs:  Clear to auscultation  Heart: Regular rate and rhythm  Abdomen:  Soft, nontender,   Extremities:  Bilateral BKA  Neurologic: Alert and oriented x 4. Slow to respond.   Skin: No lesions  Access: Left AVF    Basic Metabolic Panel: Recent Labs  Lab 06/09/21 0626  NA 136  K 3.6  CL 95*  CO2 31  GLUCOSE 106*  BUN 27*  CREATININE 3.62*  CALCIUM 9.0  MG 1.8    Liver Function Tests: Recent Labs  Lab 06/09/21 0626  AST 22  ALT 13  ALKPHOS 74  BILITOT 0.4  PROT 7.2  ALBUMIN 2.8*   No results for input(s): LIPASE, AMYLASE in the last 168 hours. No results for input(s): AMMONIA in the last 168 hours.  CBC: Recent Labs  Lab 06/09/21 0626  WBC 11.6*  NEUTROABS 10.6*  HGB 8.5*  HCT 28.4*  MCV 90.7  PLT 232    Cardiac Enzymes: No results for input(s): CKTOTAL, CKMB, CKMBINDEX, TROPONINI in the last 168 hours.  BNP: Invalid input(s): POCBNP  CBG: Recent Labs  Lab 06/09/21 0654 06/09/21 0744  GLUCAP 102* 28*     Microbiology: Results for orders placed or performed during the hospital encounter of 06/09/21  Resp Panel by RT-PCR (Flu A&B, Covid) Nasopharyngeal Swab     Status: None   Collection Time: 06/09/21  7:24 AM   Specimen: Nasopharyngeal Swab; Nasopharyngeal(NP) swabs in vial transport medium  Result Value Ref Range Status   SARS Coronavirus 2 by RT PCR NEGATIVE NEGATIVE Final    Comment: (NOTE) SARS-CoV-2 target nucleic acids are NOT DETECTED.  The SARS-CoV-2 RNA is generally detectable in upper respiratory specimens during the acute phase of infection. The lowest concentration of SARS-CoV-2 viral copies this assay can detect is 138 copies/mL. A negative result does not preclude SARS-Cov-2 infection and should not be used as the sole basis for treatment or other patient management decisions. A negative result may occur with  improper specimen collection/handling, submission of specimen other than nasopharyngeal swab, presence of viral mutation(s) within the areas targeted by this assay, and inadequate number of viral copies(<138 copies/mL). A negative result must be combined with clinical observations, patient history, and epidemiological information. The expected result is Negative.  Fact Sheet for Patients:  EntrepreneurPulse.com.au  Fact Sheet for Healthcare Providers:  IncredibleEmployment.be  This test is no t yet approved or cleared by the [place] FDA and  has been authorized for detection and/or diagnosis of SARS-CoV-2 by FDA under  an Emergency Use Authorization (EUA). This EUA will remain  in effect (meaning this test can be used) for the duration of the COVID-19 declaration under Section 564(b)(1) of the Act, 21 U.S.C.section 360bbb-3(b)(1), unless the authorization is terminated  or revoked sooner.       Influenza A by PCR NEGATIVE NEGATIVE Final   Influenza B by PCR NEGATIVE NEGATIVE Final    Comment: (NOTE) The Xpert  Xpress SARS-CoV-2/FLU/RSV plus assay is intended as an aid in the diagnosis of influenza from Nasopharyngeal swab specimens and should not be used as a sole basis for treatment. Nasal washings and aspirates are unacceptable for Xpert Xpress SARS-CoV-2/FLU/RSV testing.  Fact Sheet for Patients: EntrepreneurPulse.com.au  Fact Sheet for Healthcare Providers: IncredibleEmployment.be  This test is not yet approved or cleared by the [place] FDA and has been authorized for detection and/or diagnosis of SARS-CoV-2 by FDA under an Emergency Use Authorization (EUA). This EUA will remain in effect (meaning this test can be used) for the duration of the COVID-19 declaration under Section 564(b)(1) of the Act, 21 U.S.C. section 360bbb-3(b)(1), unless the authorization is terminated or revoked.  Performed at [facility], [address] [place], [place] [address]     Coagulation Studies: No results for input(s): LABPROT, INR in the last 72 hours.  Urinalysis: No results for input(s): COLORURINE, LABSPEC, PHURINE, GLUCOSEU, HGBUR, BILIRUBINUR, KETONESUR, PROTEINUR, UROBILINOGEN, NITRITE, LEUKOCYTESUR in the last 72 hours.  Invalid input(s): APPERANCEUR    Imaging: DG Chest Portable 1 View  Result Date: 06/09/2021 CLINICAL DATA:  Altered mental status. EXAM: PORTABLE CHEST 1 VIEW COMPARISON:  May 18, 2021. FINDINGS: Stable cardiomegaly. Stable bilateral lung opacities are noted concerning for multifocal pneumonia or possibly edema. Right pleural effusion is significantly smaller compared to prior exam. No pneumothorax is noted. Bony thorax is unremarkable. IMPRESSION: Stable bilateral lung opacities are noted concerning for multifocal pneumonia or possibly edema. Right pleural effusion is significantly smaller. Aortic Atherosclerosis (ICD10-I70.0). Electronically Signed   By: Marijo Conception M.D.   On: 06/09/2021 08:07     Medications:     dextrose 10 % and 0.45 % NaCl 50 mL/hr at 06/09/21 8850      Assessment/ Plan:  [name] is a 60 y.o. black male with end stage renal disease on hemodialysis, diabetes mellitus type II, hypertension, CVA, peripheral vascular disease status post bilateral BKA, who is admitted to [facility] on 06/09/2021 for alt mental status ems  [facility] [address] MWF L AVF   End Stage Renal Disease: MWF schedule.   Hypertension: with history of chronic systolic and diastolic congestive heart failure. home regimen of carvedilol, hydralazine, isosorbide mononitrate, lisinopril, and spironolactone   Anemia of chronic kidney disease: Hemoglobin 8.5.  - EPO with HD treatments.   Secondary Hyperparathyroidism: continue sevelamer with meals.    LOS: 0 Tiler Brandis 8/23/20229:41 AM

## 2021-06-09 NOTE — ED Notes (Signed)
CBG 105 

## 2021-06-09 NOTE — ED Notes (Signed)
Patient transported to CT 

## 2021-06-09 NOTE — Progress Notes (Signed)
CODE SEPSIS - PHARMACY COMMUNICATION  **Broad Spectrum Antibiotics should be administered within 1 hour of Sepsis diagnosis**  Time Code Sepsis Called/Page Received: 7591  Antibiotics Ordered: Zosyn + azithromycin  Time of 1st antibiotic administration: 0809  Additional action taken by pharmacy: N/A   Benita Gutter 06/09/2021  7:59 AM

## 2021-06-09 NOTE — ED Notes (Signed)
Wife at bedside.

## 2021-06-09 NOTE — H&P (Signed)
History and Physical    [name] YKD:[id number] DOB: October 05, 1961 DOA: 06/09/2021  PCP: [name], MD   Patient coming from: SNF  I have personally briefly reviewed patient's old medical records in [place]  Chief Complaint: Altered mental status   Most of the history was obtained from patient's wife at the bedside as well as from the EHR  HPI: [name] is a 60 y.o. male with medical history significant for DM of end-stage renal disease on dialysis, M/W/F, HTN, seizure disorder, CVA, peripheral arterial disease status post bilateral BKA, hydradenitis suppurativa and cutaneous Crohn's on [name] and as needed Augmentin for flareups, followed by [facility] dermatology, status post recent hospitalization for acute respiratory failure who was discharged to skilled nursing facility for subacute rehab and was sent to the emergency room for evaluation of mental status changes.   According to the EMR, patient was last seen normal at about 5 AM and then at about 615 he was found on the floor with room air pulse oximetry of 78% and blood sugar of 28.  EMS was called and administered to 50 cc of D10 with improvement in his blood sugars to 156.  Patient was transported to the ER for further evaluation. During my evaluation he is lethargic but opens his eyes to verbal stimuli.  His wife is at the bedside. He denies having any fever or chills, no chest pain, no shortness of breath, no nausea, no vomiting, no abdominal pain, no palpitations, no diaphoresis, no focal deficits or blurred vision. His wife states that his oral intake has been poor and notes that he has been off insulin for several years.  She thinks he is getting insulin at the nursing home. VBG 7.40/54/37/33/70.6 Labs show sodium 136, potassium 3.6, chloride 95, bicarb 31, glucose 106, BUN 27, creatinine 3.62, calcium 9.0, magnesium 1.8, alkaline phosphatase 74, albumin 2.8, AST 22, ALT 13, total protein 7.2, troponin 31 >.   38, procalcitonin 0.35, white count 11.6, hemoglobin 8.5, hematocrit 28.4, MCV 90.7, RDW 19.6, platelet count 232 Respiratory viral panel is negative Chest x-ray reviewed by me shows stable bilateral lung opacities concerning for multifocal pneumonia possibly edema.  Small right pleural effusion. CT scan of the head without contrast/cervical spine CT shows unchanged encephalomalacia of the anterior right MCA territory in keeping with prior infarction.  Small vessel white matter disease.  No obvious fracture of the cervical spine. 12-lead EKG reviewed by me shows sinus rhythm with LVH.     ED Course: Patient is a 60 year old male who was sent to the emergency room from the skilled nursing facility where he resides for evaluation of altered mental status and hypoglycemia.  He was hypoxic with room air pulse oximetry in the 70s requiring oxygen supplementation and chest x-ray shows findings suggestive of multifocal pneumonia. He was also hypothermic on a [employer]. He will be admitted to the hospital for further evaluation.   Review of Systems: As per HPI otherwise all other systems reviewed and negative.    Past Medical History:  Diagnosis Date   Acute metabolic encephalopathy 12/25/7671   Anemia    Crohn disease ([place])    Diabetes mellitus without complication ([place])    DVT of lower extremity (deep venous thrombosis) ([place]) 2016   Empyema ([place]) 05/20/2017   Encephalopathy 12/04/2017   Fall at home, initial encounter 09/12/2020   Hidradenitis suppurativa    Hypertension    ICH (intracerebral hemorrhage) ([place])    Peritonitis ([place]) 04/21/2017  Pyogenic arthritis of knee ([place]) 02/04/2016   Renal disorder    Sepsis ([place]) 01/12/2018   Stroke [facility])     Past Surgical History:  Procedure Laterality Date   A/V FISTULAGRAM Left 02/24/2021   Procedure: A/V FISTULAGRAM;  Surgeon: Katha Cabal, MD;  Location: [place] [facility];  Service: Cardiovascular;  Laterality: Left;   ABDOMINAL SURGERY      AMPUTATION FINGER Left 06/2019   PR AMPUTATION LONG FINGER/THUMB+FLAPS [facility]   ANGIOPLASTY Left    left fem-pop at [facility] 04-11-2018   BELOW KNEE LEG AMPUTATION Right 08/2017   [facility]   COLONOSCOPY     COLONOSCOPY WITH PROPOFOL N/A 10/28/2020   Procedure: COLONOSCOPY WITH PROPOFOL;  Surgeon: Lin Landsman, MD;  Location: [place];  Service: Gastroenterology;  Laterality: N/A;   COLONOSCOPY WITH PROPOFOL N/A 11/21/2020   Procedure: COLONOSCOPY WITH PROPOFOL;  Surgeon: [name], MD;  Location: [facility] ENDOSCOPY;  Service: Endoscopy;  Laterality: N/A;   DIALYSIS/PERMA CATHETER INSERTION N/A 12/09/2017   Procedure: DIALYSIS/PERMA CATHETER INSERTION;  Surgeon: Katha Cabal, MD;  Location: [place] [facility];  Service: Cardiovascular;  Laterality: N/A;   DIALYSIS/PERMA CATHETER INSERTION N/A 12/12/2017   Procedure: DIALYSIS/PERMA CATHETER INSERTION;  Surgeon: Algernon Huxley, MD;  Location: [place] [facility];  Service: Cardiovascular;  Laterality: N/A;   DIALYSIS/PERMA CATHETER REMOVAL Left 12/09/2017   Procedure: DIALYSIS/PERMA CATHETER REMOVAL;  Surgeon: Katha Cabal, MD;  Location: [place] [facility];  Service: Cardiovascular;  Laterality: Left;   ESOPHAGOGASTRODUODENOSCOPY (EGD) WITH PROPOFOL N/A 11/20/2020   Procedure: ESOPHAGOGASTRODUODENOSCOPY (EGD) WITH PROPOFOL;  Surgeon: [name], MD;  Location: [facility] ENDOSCOPY;  Service: Endoscopy;  Laterality: N/A;   KNEE SURGERY Left 02/04/2016   [facility]   LEG AMPUTATION THROUGH LOWER TIBIA AND FIBULA Left 06/22/2018   [facility]   LOWER EXTREMITY ANGIOGRAPHY Right 08/08/2017   Procedure: Lower Extremity Angiography;  Surgeon: Algernon Huxley, MD;  Location: [place] [facility];  Service: Cardiovascular;  Laterality: Right;   LOWER EXTREMITY ANGIOGRAPHY Right 08/22/2017   Procedure: Lower Extremity Angiography;  Surgeon: Algernon Huxley, MD;  Location: [place] [facility];  Service: Cardiovascular;  Laterality: Right;   LOWER EXTREMITY  INTERVENTION  08/08/2017   Procedure: LOWER EXTREMITY INTERVENTION;  Surgeon: Algernon Huxley, MD;  Location: [place] [facility];  Service: Cardiovascular;;   LOWER EXTREMITY INTERVENTION  08/22/2017   Procedure: LOWER EXTREMITY INTERVENTION;  Surgeon: Algernon Huxley, MD;  Location: [place] [facility];  Service: Cardiovascular;;     reports that he has quit smoking. He has never used smokeless tobacco. He reports current drug use. Drug: Marijuana. He reports that he does not drink alcohol.  Allergies  Allergen Reactions   Methotrexate Other (See Comments)    Blood count drops   Vancomycin Shortness Of Breath    Eyes watering, SOB, wheezing   Cefepime Other (See Comments)   Tape     Family History  Problem Relation Age of Onset   Irritable bowel syndrome Sister    Diabetes Sister    Heart disease Mother    Diabetes Mother    Heart disease Father    Rheumatic fever Father        as child   Psoriasis Brother    Arthritis Brother    Diabetes Sister    Diabetes Sister       Prior to Admission medications   Medication Sig Start Date End Date Taking? Authorizing Provider  acetaminophen (TYLENOL) 500 MG  tablet Take 1,000 mg by mouth daily as needed for moderate pain or headache.     [provider]  Adalimumab ([name] PEN) 40 MG/0.4ML PNKT Inject 40 mg into the muscle once a week.  11/27/18   [provider]  Alcohol Swabs PADS Use as directed to check blood sugar three times daily for insulin dependent type 2 diabetes. 10/20/17   [name], MD  aspirin 81 MG chewable tablet Chew 81 mg by mouth daily.    [provider]  atorvastatin (LIPITOR) 80 MG tablet TAKE 1 TABLET(80 MG) BY MOUTH DAILY 06/25/20   [name], MD  Blood Glucose Monitoring Suppl (ONE TOUCH ULTRA 2) w/Device KIT Use as directed to check blood sugar three times daily. E11.9 02/20/18   [name], MD  carvedilol (COREG) 25 MG tablet Take 1 tablet (25 mg total) by mouth 2 (two)  times daily. 06/27/20   [name], MD  feeding supplement (ENSURE ENLIVE / ENSURE PLUS) LIQD Take 237 mLs by mouth 3 (three) times daily between meals. 05/15/21   Lorella Nimrod, MD  gabapentin (NEURONTIN) 100 MG capsule Take 100 mg by mouth at bedtime. Take along with 313m capsule at bedtime    [provider]  hydrALAZINE (APRESOLINE) 100 MG tablet Take 1 tablet (100 mg total) by mouth every 8 (eight) hours. 05/15/21   ALorella Nimrod MD  isosorbide dinitrate (ISORDIL) 20 MG tablet Take 1 tablet (20 mg total) by mouth 3 (three) times daily. 05/26/21 06/25/21  WWyvonnia Dusky MD  levETIRAcetam (KEPPRA) 500 MG tablet Take 1 tablet (500 mg total) by mouth daily. 05/16/21   ALorella Nimrod MD  lisinopril (ZESTRIL) 40 MG tablet Take 40 mg by mouth daily. 12/17/20   [provider]  multivitamin (RENA-VIT) TABS tablet Take 1 tablet by mouth at bedtime. 05/15/21   ALorella Nimrod MD  sevelamer carbonate (RENVELA) 800 MG tablet TAKE 1 TABLET(800 MG) BY MOUTH THREE TIMES DAILY 09/22/20   FBirdie Sons MD  spironolactone (ALDACTONE) 25 MG tablet Take 1 tablet by mouth at bedtime. 12/17/20 12/17/21  [provider]    Physical Exam: Vitals:   06/09/21 1000 06/09/21 1011 06/09/21 1015 06/09/21 1030  BP: (!) 146/70  (!) 145/70 (!) 155/79  Pulse: 69 71 70 73  Resp: _0 Temp:  (!) 95.1 F (35.1 C)    TempSrc:  Rectal    SpO2: 97% 97% 97% 97%  Weight:      Height:         Vitals:   06/09/21 1000 06/09/21 1011 06/09/21 1015 06/09/21 1030  BP: (!) 146/70  (!) 145/70 (!) 155/79  Pulse: 69 71 70 73  Resp: _1 Temp:  (!) 95.1 F (35.1 C)    TempSrc:  Rectal    SpO2: 97% 97% 97% 97%  Weight:      Height:          Constitutional: Lethargic but arouses to verbal stimuli.  Oriented to person and place. Not in any apparent distress.  Thin and frail HEENT:      Head: Normocephalic and atraumatic.         Eyes: PERLA, EOMI, Conjunctivae are normal. Sclera  is non-icteric.       Mouth/Throat: Mucous membranes are moist.       Neck: Supple with no signs of meningismus. Cardiovascular: Regular rate and rhythm. No murmurs, gallops, or rubs. 2+ symmetrical distal pulses are  present . No JVD. Respiratory: Respiratory effort normal .  Bilateral air entry in both lung fields. No wheezes or crackles. Scattered rhonchi.  Gastrointestinal: Soft, non tender, and non distended with positive bowel sounds.  Genitourinary: No CVA tenderness. Musculoskeletal: Nontender with normal range of motion in all extremities.  Bilateral BKA Neurologic:  Face is symmetric. Moving all extremities. No gross focal neurologic deficits.  Generalized weakness Skin: Skin is warm, dry.  No rash or ulcers Psychiatric: Mood and affect are normal    Labs on Admission: I have personally reviewed following labs and imaging studies  CBC: Recent Labs  Lab 06/09/21 0626  WBC 11.6*  NEUTROABS 10.6*  HGB 8.5*  HCT 28.4*  MCV 90.7  PLT 355   Basic Metabolic Panel: Recent Labs  Lab 06/09/21 0626  NA 136  K 3.6  CL 95*  CO2 31  GLUCOSE 106*  BUN 27*  CREATININE 3.62*  CALCIUM 9.0  MG 1.8   GFR: Estimated Creatinine Clearance: 18.9 mL/min (A) (by C-G formula based on SCr of 3.62 mg/dL (H)). Liver Function Tests: Recent Labs  Lab 06/09/21 0626  AST 22  ALT 13  ALKPHOS 74  BILITOT 0.4  PROT 7.2  ALBUMIN 2.8*   No results for input(s): LIPASE, AMYLASE in the last 168 hours. No results for input(s): AMMONIA in the last 168 hours. Coagulation Profile: No results for input(s): INR, PROTIME in the last 168 hours. Cardiac Enzymes: No results for input(s): CKTOTAL, CKMB, CKMBINDEX, TROPONINI in the last 168 hours. BNP (last 3 results) No results for input(s): PROBNP in the last 8760 hours. HbA1C: No results for input(s): HGBA1C in the last 72 hours. CBG: Recent Labs  Lab 06/09/21 0654 06/09/21 0744 06/09/21 0941  GLUCAP 102* 59* 103*   Lipid Profile: No  results for input(s): CHOL, HDL, LDLCALC, TRIG, CHOLHDL, LDLDIRECT in the last 72 hours. Thyroid Function Tests: No results for input(s): TSH, T4TOTAL, FREET4, T3FREE, THYROIDAB in the last 72 hours. Anemia Panel: No results for input(s): VITAMINB12, FOLATE, FERRITIN, TIBC, IRON, RETICCTPCT in the last 72 hours. Urine analysis:    Component Value Date/Time   COLORURINE YELLOW (A) 02/06/2021 2041   APPEARANCEUR CLOUDY (A) 02/06/2021 2041   LABSPEC 1.024 02/06/2021 2041   PHURINE 7.0 02/06/2021 2041   GLUCOSEU 150 (A) 02/06/2021 2041   HGBUR MODERATE (A) 02/06/2021 2041   BILIRUBINUR NEGATIVE 02/06/2021 2041   KETONESUR NEGATIVE 02/06/2021 2041   PROTEINUR >=300 (A) 02/06/2021 2041   NITRITE NEGATIVE 02/06/2021 2041   LEUKOCYTESUR NEGATIVE 02/06/2021 2041    Radiological Exams on Admission: CT Head Wo Contrast  Result Date: 06/09/2021 CLINICAL DATA:  Found on floor, unwitnessed fall EXAM: CT HEAD WITHOUT CONTRAST CT CERVICAL SPINE WITHOUT CONTRAST TECHNIQUE: Multidetector CT imaging of the head and cervical spine was performed following the standard protocol without intravenous contrast. Multiplanar CT image reconstructions of the cervical spine were also generated. COMPARISON:  02/06/2021 FINDINGS: CT HEAD FINDINGS Brain: No evidence of acute infarction, hemorrhage, hydrocephalus, extra-axial collection or mass lesion/mass effect. Unchanged encephalomalacia of the anterior right MCA territory (series 2, image 11). Underlying periventricular and deep white matter hypodensity. Vascular: No hyperdense vessel or unexpected calcification. Skull: Normal. Negative for fracture or focal lesion. Sinuses/Orbits: No acute finding. Other: None. CT CERVICAL SPINE FINDINGS Examination of the cervical spine is significantly limited by patient motion artifact, including a repeat acquisition. Alignment: Degenerative straightening of the normal cervical lordosis. Skull base and vertebrae: No acute fracture. No  primary bone lesion or  focal pathologic process. Soft tissues and spinal canal: No prevertebral fluid or swelling. No visible canal hematoma. Disc levels: Focally moderate disc space height loss and osteophytosis of C6-C7. Disc spaces are otherwise intact. Upper chest: Negative. Other: None. IMPRESSION: 1. No acute intracranial pathology. 2. Unchanged encephalomalacia of the anterior right MCA territory in keeping with prior infarction. 3. Small-vessel white matter disease. 4. Examination of the cervical spine is significantly limited by patient motion artifact, including a repeat acquisition. Within this limitation, no obvious displaced fracture or static subluxation of the cervical spine. Electronically Signed   By: Eddie Candle M.D.   On: 06/09/2021 09:55   CT Cervical Spine Wo Contrast  Result Date: 06/09/2021 CLINICAL DATA:  Found on floor, unwitnessed fall EXAM: CT HEAD WITHOUT CONTRAST CT CERVICAL SPINE WITHOUT CONTRAST TECHNIQUE: Multidetector CT imaging of the head and cervical spine was performed following the standard protocol without intravenous contrast. Multiplanar CT image reconstructions of the cervical spine were also generated. COMPARISON:  02/06/2021 FINDINGS: CT HEAD FINDINGS Brain: No evidence of acute infarction, hemorrhage, hydrocephalus, extra-axial collection or mass lesion/mass effect. Unchanged encephalomalacia of the anterior right MCA territory (series 2, image 11). Underlying periventricular and deep white matter hypodensity. Vascular: No hyperdense vessel or unexpected calcification. Skull: Normal. Negative for fracture or focal lesion. Sinuses/Orbits: No acute finding. Other: None. CT CERVICAL SPINE FINDINGS Examination of the cervical spine is significantly limited by patient motion artifact, including a repeat acquisition. Alignment: Degenerative straightening of the normal cervical lordosis. Skull base and vertebrae: No acute fracture. No primary bone lesion or focal pathologic  process. Soft tissues and spinal canal: No prevertebral fluid or swelling. No visible canal hematoma. Disc levels: Focally moderate disc space height loss and osteophytosis of C6-C7. Disc spaces are otherwise intact. Upper chest: Negative. Other: None. IMPRESSION: 1. No acute intracranial pathology. 2. Unchanged encephalomalacia of the anterior right MCA territory in keeping with prior infarction. 3. Small-vessel white matter disease. 4. Examination of the cervical spine is significantly limited by patient motion artifact, including a repeat acquisition. Within this limitation, no obvious displaced fracture or static subluxation of the cervical spine. Electronically Signed   By: Eddie Candle M.D.   On: 06/09/2021 09:55   DG Chest Portable 1 View  Result Date: 06/09/2021 CLINICAL DATA:  Altered mental status. EXAM: PORTABLE CHEST 1 VIEW COMPARISON:  May 18, 2021. FINDINGS: Stable cardiomegaly. Stable bilateral lung opacities are noted concerning for multifocal pneumonia or possibly edema. Right pleural effusion is significantly smaller compared to prior exam. No pneumothorax is noted. Bony thorax is unremarkable. IMPRESSION: Stable bilateral lung opacities are noted concerning for multifocal pneumonia or possibly edema. Right pleural effusion is significantly smaller. Aortic Atherosclerosis (ICD10-I70.0). Electronically Signed   By: Marijo Conception M.D.   On: 06/09/2021 08:07     Assessment/Plan Principal Problem:   Acute metabolic encephalopathy Active Problems:   Hidradenitis suppurativa   Type 2 diabetes mellitus with hypoglycemia without coma (HCC)   End stage renal failure on dialysis [facility])   History of CVA (cerebrovascular accident)   Seizure disorder ([place])   S/P bilateral BKA (below knee amputation) ([place])   Cerebrovascular disease   Chronic systolic CHF (congestive heart failure) (HCC)       Acute metabolic encephalopathy Most likely related to hypoglycemia Patient's blood sugar in  the field was 28 and he received to 50 cc of 10% dextrose Continue D5W at 30 cc an hour Encourage oral intake Check blood sugars every 4 hours  Diabetes mellitus with complications of end-stage renal disease on hemodialysis as well as hypoglycemia Patient has a history of diabetes mellitus and per his wife has been insulin for years He currently resides in a skilled nursing facility and it is unclear if patient received insulin.  He presented with altered mental status and blood sugar of 28. Continue dextrose infusion Check blood sugars every 4 hours Dialysis days are M/W/F Consult nephrology for renal replacement therapy while inpatient       Multifocal pneumonia Noted on chest x-ray Patient recently discharged from the hospital and completed antibiotic therapy for same He denies having a cough and is afebrile ??  Chest x-ray findings could be lingering from his prior infection Will obtain CT scan of the chest without contrast for further evaluation. Speech therapy consult for swallow function evaluation    Anemia of end-stage renal disease H&H is stable Continue iron supplements         History of CVA (cerebrovascular accident)  Post stroke seizure disorder and cognitive deficit CT head with no acute findings and patient with no focal deficit Continue antiplatelets and statins  Continue Keppra Seizure prophylaxis       Peripheral arterial disease S/P bilateral BKA (below knee amputation) ([place])  Increase nursing assistance for transfers.  Trapeze above bed if  desired Continue statins, aspirin and metoprolol      Chronic combined systolic and diastolic CHF (congestive heart failure) ([place]) Not acutely exacerbated Last echo from August 2022 with EF 35 to 40% and grade 2 diastolic dysfunction Continue lisinopril, spironolactone and carvedilol     Essential hypertension Continue nitrates, lisinopril, Coreg and hydralazine     DVT prophylaxis: Heparin   Code Status: full code  Family Communication: Greater than 50% of time was spent discussing patient's condition and plan of care with with his wife at the bedside.  All questions and concerns have been addressed.  She verbalizes understanding and agrees with the plan. Disposition Plan: Back to previous home environment Consults called: Nephrology  Status: At the time of admission, it appears that the appropriate admission status for this patient is inpatient. This is judged to be reasonable and necessary in order to provide the required intensity of service to ensure the patient's safety given the presenting symptoms, physical exam findings, and initial radiographic and laboratory data in the context of their comorbid conditions. Patient requires inpatient status due to high intensity of service, high risk of further deterioration and high frequency of surveillance required.    Collier Bullock MD [facility]     06/09/2021, 11:02 AM

## 2021-06-09 NOTE — ED Provider Notes (Signed)
[facility] Emergency Department Provider Note   ____________________________________________   Event Date/Time   First MD Initiated Contact with Patient 06/09/21 [phone]     (approximate)  I have reviewed the triage vital signs and the nursing notes.   HISTORY  Chief Complaint Hypoglycemia and Fall    HPI [name] is a 60 y.o. male with past medical history of hypertension, hyperlipidemia, ESRD on HD (MWF), seizures, CHF, and hidradenitis on [name] who presents to the ED for altered mental status.  Per EMS, patient was found down on the ground beside his bed at 615 this morning with room air sats of 78%.  He was last seen well at 5:00 this morning, when he was awake and alert.  When EMS arrived, patient noted to have O2 sats of 98% on room air, however blood glucose noted to be 28.  He was given 250 cc of D10 with improvement to 156, noted to be responsive but lethargic by EMS.  Patient opens eyes to voice but does not offer any verbal response, nods his head "no" when asked if he is in pain but otherwise does not respond to questioning.        Past Medical History:  Diagnosis Date   Acute metabolic encephalopathy 12/20/3612   Anemia    Crohn disease ([place])    Diabetes mellitus without complication ([place])    DVT of lower extremity (deep venous thrombosis) ([place]) 2016   Empyema ([place]) 05/20/2017   Encephalopathy 12/04/2017   Fall at home, initial encounter 09/12/2020   Hidradenitis suppurativa    Hypertension    ICH (intracerebral hemorrhage) ([place])    Peritonitis ([place]) 04/21/2017   Pyogenic arthritis of knee ([place]) 02/04/2016   Renal disorder    Sepsis ([place]) 01/12/2018   Stroke [facility])     Patient Active Problem List   Diagnosis Date Noted   Unstageable pressure ulcer of sacral region ([place]) 05/22/2021   Acute decompensated heart failure ([place]) 05/19/2021   Acute respiratory failure with hypoxia ([place]) 05/18/2021   Protein-calorie malnutrition, severe  05/14/2021   Acute pulmonary edema (HCC)    Shortness of breath    Fluid overload 05/12/2021   Cellulitis and abscess of buttock    History of GI bleed 02/06/2021   Long term current use of immunosuppressive drug 02/06/2021   Disorder of skin due to Crohn's disease ([place]) 43/15/4008   Complication of vascular access for dialysis 01/22/2021   Polyp of transverse colon    Acute metabolic encephalopathy 67/61/9509   Acute on chronic anemia 32/67/1245   Chronic systolic CHF (congestive heart failure) ([place]) 11/18/2020   Cerebrovascular disease 09/12/2020   Transient alteration of awareness 09/12/2020   Hyperkalemia 09/12/2020   Mild cognitive impairment 09/30/2019   Peripheral vascular disease ([place]) 06/28/2018   Sepsis ([place]) 01/12/2018   Pressure injury of skin 12/04/2017   S/P bilateral BKA (below knee amputation) ([place]) 10/20/2017   Atherosclerosis of native arteries of extremity with rest pain ([place]) 08/05/2017   History of CVA (cerebrovascular accident) 04/15/2017   Seizure disorder ([place]) 04/15/2017   End stage renal failure on dialysis ([place]) 04/12/2016   Aphthae 02/20/2016   Hidradenitis suppurativa 02/20/2016   Leg pain 02/20/2016   Neuropathy 02/20/2016   Narrowing of intervertebral disc space 08/29/2015   Vascular disorder of lower extremity 08/29/2015   Failure of erection 08/29/2015   Hypercholesteremia 08/29/2015   Hypertension 08/29/2015   Anemia due to chronic kidney disease 05/26/2015   Venous insufficiency of  leg 09/04/2014   History of deep vein thrombosis (DVT) of lower extremity 08/16/2014   Prostatic intraepithelial neoplasia 11/02/2013   Elevated prostate specific antigen (PSA) 09/11/2013   Benign prostatic hyperplasia with urinary obstruction 08/13/2013   Spermatocele 08/13/2013   Avitaminosis D 01/25/2013   Type 2 diabetes mellitus with kidney complication, with long-term current use of insulin ([place]) 03/28/2012   Crohn disease ([place]) 08/03/2011    Past  Surgical History:  Procedure Laterality Date   A/V FISTULAGRAM Left 02/24/2021   Procedure: A/V FISTULAGRAM;  Surgeon: Katha Cabal, MD;  Location: [place] CV [facility];  Service: Cardiovascular;  Laterality: Left;   ABDOMINAL SURGERY     AMPUTATION FINGER Left 06/2019   PR AMPUTATION LONG FINGER/THUMB+FLAPS [facility]   ANGIOPLASTY Left    left fem-pop [facility] 04-11-2018   BELOW KNEE LEG AMPUTATION Right 08/2017   [facility]   COLONOSCOPY     COLONOSCOPY WITH PROPOFOL N/A 10/28/2020   Procedure: COLONOSCOPY WITH PROPOFOL;  Surgeon: Lin Landsman, MD;  Location: [place];  Service: Gastroenterology;  Laterality: N/A;   COLONOSCOPY WITH PROPOFOL N/A 11/21/2020   Procedure: COLONOSCOPY WITH PROPOFOL;  Surgeon: [name], MD;  Location: [facility] ENDOSCOPY;  Service: Endoscopy;  Laterality: N/A;   DIALYSIS/PERMA CATHETER INSERTION N/A 12/09/2017   Procedure: DIALYSIS/PERMA CATHETER INSERTION;  Surgeon: Katha Cabal, MD;  Location: [place] CV LAB;  Service: Cardiovascular;  Laterality: N/A;   DIALYSIS/PERMA CATHETER INSERTION N/A 12/12/2017   Procedure: DIALYSIS/PERMA CATHETER INSERTION;  Surgeon: Algernon Huxley, MD;  Location: [place] CV LAB;  Service: Cardiovascular;  Laterality: N/A;   DIALYSIS/PERMA CATHETER REMOVAL Left 12/09/2017   Procedure: DIALYSIS/PERMA CATHETER REMOVAL;  Surgeon: Katha Cabal, MD;  Location: [place] CV LAB;  Service: Cardiovascular;  Laterality: Left;   ESOPHAGOGASTRODUODENOSCOPY (EGD) WITH PROPOFOL N/A 11/20/2020   Procedure: ESOPHAGOGASTRODUODENOSCOPY (EGD) WITH PROPOFOL;  Surgeon: [name], MD;  Location: [facility] ENDOSCOPY;  Service: Endoscopy;  Laterality: N/A;   KNEE SURGERY Left 02/04/2016   [facility]   LEG AMPUTATION THROUGH LOWER TIBIA AND FIBULA Left 06/22/2018   [facility]   LOWER EXTREMITY ANGIOGRAPHY Right 08/08/2017   Procedure: Lower Extremity Angiography;  Surgeon: Algernon Huxley, MD;  Location: [place] CV LAB;  Service: Cardiovascular;   Laterality: Right;   LOWER EXTREMITY ANGIOGRAPHY Right 08/22/2017   Procedure: Lower Extremity Angiography;  Surgeon: Algernon Huxley, MD;  Location: [place] CV LAB;  Service: Cardiovascular;  Laterality: Right;   LOWER EXTREMITY INTERVENTION  08/08/2017   Procedure: LOWER EXTREMITY INTERVENTION;  Surgeon: Algernon Huxley, MD;  Location: [place] CV LAB;  Service: Cardiovascular;;   LOWER EXTREMITY INTERVENTION  08/22/2017   Procedure: LOWER EXTREMITY INTERVENTION;  Surgeon: Algernon Huxley, MD;  Location: [place] CV LAB;  Service: Cardiovascular;;    Prior to Admission medications   Medication Sig Start Date End Date Taking? Authorizing Provider  acetaminophen (TYLENOL) 500 MG tablet Take 1,000 mg by mouth daily as needed for moderate pain or headache.     [provider]  Adalimumab ([name] PEN) 40 MG/0.4ML PNKT Inject 40 mg into the muscle once a week.  11/27/18   [provider]  Alcohol Swabs PADS Use as directed to check blood sugar three times daily for insulin dependent type 2 diabetes. 10/20/17   [name], MD  aspirin 81 MG chewable tablet Chew 81 mg by mouth daily.    [provider]  atorvastatin (LIPITOR) 80 MG tablet TAKE 1 TABLET(80 MG)  BY MOUTH DAILY 06/25/20   [name], MD  Blood Glucose Monitoring Suppl (ONE TOUCH ULTRA 2) w/Device KIT Use as directed to check blood sugar three times daily. E11.9 02/20/18   [name], MD  carvedilol (COREG) 25 MG tablet Take 1 tablet (25 mg total) by mouth 2 (two) times daily. 06/27/20   [name], MD  feeding supplement (ENSURE ENLIVE / ENSURE PLUS) LIQD Take 237 mLs by mouth 3 (three) times daily between meals. 05/15/21   Lorella Nimrod, MD  gabapentin (NEURONTIN) 100 MG capsule Take 100 mg by mouth at bedtime. Take along with 368m capsule at bedtime    [provider]  hydrALAZINE (APRESOLINE) 100 MG tablet Take 1 tablet (100 mg total) by mouth every 8 (eight) hours. 05/15/21    ALorella Nimrod MD  isosorbide dinitrate (ISORDIL) 20 MG tablet Take 1 tablet (20 mg total) by mouth 3 (three) times daily. 05/26/21 06/25/21  WWyvonnia Dusky MD  levETIRAcetam (KEPPRA) 500 MG tablet Take 1 tablet (500 mg total) by mouth daily. 05/16/21   ALorella Nimrod MD  lisinopril (ZESTRIL) 40 MG tablet Take 40 mg by mouth daily. 12/17/20   [provider]  multivitamin (RENA-VIT) TABS tablet Take 1 tablet by mouth at bedtime. 05/15/21   ALorella Nimrod MD  sevelamer carbonate (RENVELA) 800 MG tablet TAKE 1 TABLET(800 MG) BY MOUTH THREE TIMES DAILY 09/22/20   FBirdie Sons MD  spironolactone (ALDACTONE) 25 MG tablet Take 1 tablet by mouth at bedtime. 12/17/20 12/17/21  [provider]    Allergies Methotrexate, Vancomycin, Cefepime, and Tape  Family History  Problem Relation Age of Onset   Irritable bowel syndrome Sister    Diabetes Sister    Heart disease Mother    Diabetes Mother    Heart disease Father    Rheumatic fever Father        as child   Psoriasis Brother    Arthritis Brother    Diabetes Sister    Diabetes Sister     Social History Social History   Tobacco Use   Smoking status: Former   Smokeless tobacco: Never   Tobacco comments:    smokes marijuana  Vaping Use   Vaping Use: Never used  Substance Use Topics   Alcohol use: No   Drug use: Yes    Types: Marijuana    Review of Systems Unable to obtain secondary to altered mental status  ____________________________________________   PHYSICAL EXAM:  VITAL SIGNS: ED Triage Vitals  Enc Vitals Group     BP 06/09/21 0656 (!) 165/72     Pulse Rate 06/09/21 0656 60     Resp 06/09/21 0656 16     Temp 06/09/21 0657 (!) 92.7 F (33.7 C)     Temp Source 06/09/21 0657 Rectal     SpO2 06/09/21 0656 98 %     Weight 06/09/21 0654 132 lb 7.9 oz (60.1 kg)     Height 06/09/21 0654 5' 11" (1.803 m)     Head Circumference --      Peak Flow --      Pain Score 06/09/21 0654 Asleep     Pain Loc --       Pain Edu? --      Excl. in [place] --     Constitutional: Somnolent but opens eyes to voice, nods his head in response to questions. Eyes: Conjunctivae are normal.  Pupils equal, round, and reactive to light bilaterally. Head: Atraumatic. Nose: No  congestion/rhinnorhea. Mouth/Throat: Mucous membranes are moist. Neck: Normal ROM Cardiovascular: Normal rate, regular rhythm. Grossly normal heart sounds.  Left upper extremity AV fistula with palpable thrill.  2+ radial pulses bilaterally. Respiratory: Normal respiratory effort.  No retractions. Lungs CTAB. Gastrointestinal: Soft and nontender. No distention. Genitourinary: deferred Musculoskeletal: Status post bilateral BKA.  No lower extremity tenderness nor edema. Neurologic: No verbal response noted. No gross focal neurologic deficits are appreciated.  Patient withdraws from pain in all 4 extremities. Skin:  Skin is warm, dry and intact. No rash noted. Psychiatric: Unable to assess.  ____________________________________________   LABS (all labs ordered are listed, but only abnormal results are displayed)  Labs Reviewed  CBC WITH DIFFERENTIAL/PLATELET - Abnormal; Notable for the following components:      Result Value   WBC 11.6 (*)    RBC 3.13 (*)    Hemoglobin 8.5 (*)    HCT 28.4 (*)    MCHC 29.9 (*)    RDW 19.6 (*)    Neutro Abs 10.6 (*)    Lymphs Abs 0.6 (*)    Abs Immature Granulocytes 0.12 (*)    All other components within normal limits  COMPREHENSIVE METABOLIC PANEL - Abnormal; Notable for the following components:   Chloride 95 (*)    Glucose, Bld 106 (*)    BUN 27 (*)    Creatinine, Ser 3.62 (*)    Albumin 2.8 (*)    GFR, Estimated 19 (*)    All other components within normal limits  BLOOD GAS, VENOUS - Abnormal; Notable for the following components:   Bicarbonate 33.4 (*)    Acid-Base Excess 7.0 (*)    All other components within normal limits  CBG MONITORING, ED - Abnormal; Notable for the following  components:   Glucose-Capillary 102 (*)    All other components within normal limits  CBG MONITORING, ED - Abnormal; Notable for the following components:   Glucose-Capillary 59 (*)    All other components within normal limits  CBG MONITORING, ED - Abnormal; Notable for the following components:   Glucose-Capillary 103 (*)    All other components within normal limits  TROPONIN I (HIGH SENSITIVITY) - Abnormal; Notable for the following components:   Troponin I (High Sensitivity) 31 (*)    All other components within normal limits  RESP PANEL BY RT-PCR (FLU A&B, COVID) ARPGX2  CULTURE, BLOOD (ROUTINE X 2)  CULTURE, BLOOD (ROUTINE X 2)  LACTIC ACID, PLASMA  PROCALCITONIN  MAGNESIUM  URINALYSIS, COMPLETE (UACMP) WITH MICROSCOPIC  LACTIC ACID, PLASMA  TROPONIN I (HIGH SENSITIVITY)   ____________________________________________  EKG  ED ECG REPORT I, Blake Divine, the attending physician, personally viewed and interpreted this ECG.   Date: 06/09/2021  EKG Time: 6:56  Rate: 60  Rhythm: normal sinus rhythm  Axis: Normal  Intervals: Prolonged QT  ST&T Change: None, LVH   PROCEDURES  Procedure(s) performed (including Critical Care):  .Critical Care  Date/Time: 06/09/2021 10:16 AM Performed by: Blake Divine, MD Authorized by: Blake Divine, MD   Critical care provider statement:    Critical care time (minutes):  45   Critical care time was exclusive of:  Separately billable procedures and treating other patients and teaching time   Critical care was necessary to treat or prevent imminent or life-threatening deterioration of the following conditions:  Respiratory failure, sepsis and metabolic crisis   Critical care was time spent personally by me on the following activities:  Discussions with consultants, evaluation of patient's response to treatment, examination of  patient, ordering and performing treatments and interventions, ordering and review of laboratory studies,  ordering and review of radiographic studies, pulse oximetry, re-evaluation of patient's condition, obtaining history from patient or surrogate and review of old charts   I assumed direction of critical care for this patient from another provider in my specialty: no     Care discussed with: admitting provider     ____________________________________________   INITIAL IMPRESSION / ASSESSMENT AND PLAN / ED COURSE      60 year old male with past medical history of hypertension, hyperlipidemia, ESRD on HD (MWF), anemia, seizure, CHF, and hidradenitis on [name] who presents to the ED for altered mental status after he was found down beside his bed by nursing staff this morning.  Patient noted to be hypoglycemic by EMS, subsequently improved following D10 bolus and we will continue to closely monitor.  On arrival, patient noted to be hypothermic, remainder of vital signs are reassuring but given his hypoglycemia we will need to further assess for sepsis.  He will be placed on Bair hugger with close monitoring for improvement in temperature.  Blood cultures and lactate drawn, we will hold off on antibiotics or IV fluid resuscitation given stable BP.  Patient noted to be hypoxic on room air to mid 80s, improved on 2 L nasal cannula.  With questionable trauma and altered mental status, we will check CT head and cervical spine.,  Labs, chest x-ray, and VBG are pending.  Patient noted to be hypoglycemic again with blood sugar in the 50s, we will start him on a D10 half NS drip at 50 cc an hour, with relatively low rate to ensure he does not become too fluid overloaded.  Chest x-ray reviewed by me and shows multifocal infiltrates concerning for pneumonia, which could be a source of sepsis given his hypothermia and hypoglycemia.  Given he was recently admitted and treated for CAP with Levaquin, we will broaden to Zosyn and azithromycin.  He continues to maintain O2 sats on 2 L nasal cannula.  CT head and cervical  spine are negative for acute traumatic injury.  Case discussed with hospitalist for admission.      ____________________________________________   FINAL CLINICAL IMPRESSION(S) / ED DIAGNOSES  Final diagnoses:  Sepsis with acute hypoxic respiratory failure without septic shock, due to unspecified organism ([place])  Hypoglycemia  Hypothermia, initial encounter  HCAP (healthcare-associated pneumonia)     ED Discharge Orders     None        Note:  This document was prepared using Dragon voice recognition software and may include unintentional dictation errors.    Blake Divine, MD 06/09/21 1017

## 2021-06-09 NOTE — Progress Notes (Signed)
Elink following for code sepsis 

## 2021-06-09 NOTE — ED Notes (Signed)
Bair hugger placed on patient.

## 2021-06-09 NOTE — ED Notes (Signed)
Admitting at bedside 

## 2021-06-09 NOTE — ED Notes (Signed)
Speech therapy at bedside.

## 2021-06-09 NOTE — Telephone Encounter (Signed)
Home Health Verbal Orders - Caller/Agency: Tiffany from [facility] Number: [phone] option 2 Requesting OT/PT/Skilled Nursing/Social Work/Speech Therapy: Skilled Nursing Frequency: Initial freq.  1w9

## 2021-06-09 NOTE — Evaluation (Addendum)
Clinical/Bedside Swallow Evaluation Patient Details  Name: [name] MRN: [id number] Date of Birth: 05/22/1961  Today's Date: 06/09/2021 Time: SLP Start Time (ACUTE ONLY): 5465 SLP Stop Time (ACUTE ONLY): 1050 SLP Time Calculation (min) (ACUTE ONLY): 60 min  Past Medical History:  Past Medical History:  Diagnosis Date   Acute metabolic encephalopathy 0/12/5463   Anemia    Crohn disease ([place])    Diabetes mellitus without complication ([place])    DVT of lower extremity (deep venous thrombosis) ([place]) 2016   Empyema ([place]) 05/20/2017   Encephalopathy 12/04/2017   Fall at home, initial encounter 09/12/2020   Hidradenitis suppurativa    Hypertension    ICH (intracerebral hemorrhage) ([place])    Peritonitis ([place]) 04/21/2017   Pyogenic arthritis of knee ([place]) 02/04/2016   Renal disorder    Sepsis ([place]) 01/12/2018   Stroke [facility])    Past Surgical History:  Past Surgical History:  Procedure Laterality Date   A/V FISTULAGRAM Left 02/24/2021   Procedure: A/V FISTULAGRAM;  Surgeon: Katha Cabal, MD;  Location: [place] [facility];  Service: Cardiovascular;  Laterality: Left;   ABDOMINAL SURGERY     AMPUTATION FINGER Left 06/2019   PR AMPUTATION LONG FINGER/THUMB+FLAPS [facility]   ANGIOPLASTY Left    left fem-pop at [facility] 04-11-2018   BELOW KNEE LEG AMPUTATION Right 08/2017   [facility]   COLONOSCOPY     COLONOSCOPY WITH PROPOFOL N/A 10/28/2020   Procedure: COLONOSCOPY WITH PROPOFOL;  Surgeon: Lin Landsman, MD;  Location: [place];  Service: Gastroenterology;  Laterality: N/A;   COLONOSCOPY WITH PROPOFOL N/A 11/21/2020   Procedure: COLONOSCOPY WITH PROPOFOL;  Surgeon: [name], MD;  Location: [facility];  Service: Endoscopy;  Laterality: N/A;   DIALYSIS/PERMA CATHETER INSERTION N/A 12/09/2017   Procedure: DIALYSIS/PERMA CATHETER INSERTION;  Surgeon: Katha Cabal, MD;  Location: [place] [facility];  Service: Cardiovascular;  Laterality: N/A;   DIALYSIS/PERMA CATHETER INSERTION N/A  12/12/2017   Procedure: DIALYSIS/PERMA CATHETER INSERTION;  Surgeon: Algernon Huxley, MD;  Location: [place] [facility];  Service: Cardiovascular;  Laterality: N/A;   DIALYSIS/PERMA CATHETER REMOVAL Left 12/09/2017   Procedure: DIALYSIS/PERMA CATHETER REMOVAL;  Surgeon: Katha Cabal, MD;  Location: [place] [facility];  Service: Cardiovascular;  Laterality: Left;   ESOPHAGOGASTRODUODENOSCOPY (EGD) WITH PROPOFOL N/A 11/20/2020   Procedure: ESOPHAGOGASTRODUODENOSCOPY (EGD) WITH PROPOFOL;  Surgeon: [name], MD;  Location: [facility] ENDOSCOPY;  Service: Endoscopy;  Laterality: N/A;   KNEE SURGERY Left 02/04/2016   [facility]   LEG AMPUTATION THROUGH LOWER TIBIA AND FIBULA Left 06/22/2018   [facility]   LOWER EXTREMITY ANGIOGRAPHY Right 08/08/2017   Procedure: Lower Extremity Angiography;  Surgeon: Algernon Huxley, MD;  Location: [place] [facility];  Service: Cardiovascular;  Laterality: Right;   LOWER EXTREMITY ANGIOGRAPHY Right 08/22/2017   Procedure: Lower Extremity Angiography;  Surgeon: Algernon Huxley, MD;  Location: [place] [facility];  Service: Cardiovascular;  Laterality: Right;   LOWER EXTREMITY INTERVENTION  08/08/2017   Procedure: LOWER EXTREMITY INTERVENTION;  Surgeon: Algernon Huxley, MD;  Location: [place] [facility];  Service: Cardiovascular;;   LOWER EXTREMITY INTERVENTION  08/22/2017   Procedure: LOWER EXTREMITY INTERVENTION;  Surgeon: Algernon Huxley, MD;  Location: [place] [facility];  Service: Cardiovascular;;   HPI:  Pt is a 60 y.o. male with history of MULTIPLE medical dxs including mild Cognitive decline, ESRD on hemodialysis on Monday Wednesday Friday, Crohn's Dis., hypertension, seizure disorder, anemia, systolic heart failure, hiradenitis on [name], bilateral BKAs  w/ recent admits to the ER because of worsening shortness of breath.  Patient was recently admitted and discharged ~3 weeks ago most recently after being treated for fluid overload and possible pneumonia. This morning, pt was checked  on around 0615 by staff at [facility] where he is reciving PT Rehab and he was found on floor sats 78% and blood glucose noted to be 28.  Chest x-ray reviewed by me shows stable bilateral lung opacities concerning for multifocal pneumonia possibly edema.  Small right pleural effusion.  CT scan of the head without contrast/cervical spine CT shows unchanged encephalomalacia of the anterior right MCA territory in keeping with prior infarction.  Small vessel white matter disease.   Assessment / Plan / Recommendation Clinical Impression  Pt appears to present w/ adequate oropharyngeal phase swallow function w/ No overt oropharyngeal phase dysphagia noted, No neuromuscular deficits noted. Pt consumed po trials w/ No overt, clinical s/s of aspiration during po trials. Pt appears at reduced risk for aspiration following general aspiration precautions and given min setup support d/t overall weakness. Pt's Wife stated he has been losing weight and has been "sick for about a month now".    During po trials, pt consumed all consistencies w/ no overt coughing, decline in vocal quality, or change in respiratory presentation during/post trials. O2 sats remained 97-98%. Oral phase appeared grossly [facility] w/ timely bolus management, mastication, and control of bolus propulsion for A-P transfer for swallowing. Oral clearing achieved w/ all trial consistencies. OM Exam appeared [facility] w/ no unilateral weakness noted. Speech Clear. Pt fed self holding Cup when drinking but needed assistance w/ feeding of foods.    Recommend a Mech Soft/Regular consistency diet w/ well-Cut meats, moistened foods; Thin liquids. Recommend general aspiration precautions, Pills WHOLE in Puree for safer, easier swallowing. Education given on Pills in Puree; food consistencies and easy to eat options; general aspiration precautions. Pt/Wife agreed. NSG to reconsult if any new needs arise. NSG agreed.   Recommend Dietician f/u for support d/t Wife's  report of weight loss.  SLP Visit Diagnosis: Dysphagia, unspecified (R13.10)    Aspiration Risk   (reduced)    Diet Recommendation  Mech Soft/Regular consistency diet w/ well-Cut meats, moistened foods; Thin liquids. Support at meals w/ feeding as needed. Aspiration precautions.   Medication Administration: Whole meds with puree (for safer swallowing)    Other  Recommendations Recommended Consults:  (Dietician f/u for support) Oral Care Recommendations: Oral care BID;Oral care before and after PO;Patient independent with oral care (support) Other Recommendations:  (n/a)   Follow up Recommendations None      Frequency and Duration  (n/a)   (n/a)       Prognosis Prognosis for Safe Diet Advancement: Good Barriers to Reach Goals: Cognitive deficits;Time post onset;Severity of deficits      Swallow Study   General Date of Onset: 06/09/21 HPI: Pt is a 60 y.o. male with history of MULTIPLE medical dxs including mild Cognitive decline, ESRD on hemodialysis on Monday Wednesday Friday, Crohn's Dis., hypertension, seizure disorder, anemia, systolic heart failure, hiradenitis on [name], bilateral BKAs w/ recent admits to the ER because of worsening shortness of breath.  Patient was recently admitted and discharged ~3 weeks ago most recently after being treated for fluid overload and possible pneumonia. This morning, pt was checked on around 0615 by staff at [facility] where he is reciving PT Rehab and he was found on floor sats 78% and blood glucose noted to be 28.  Chest x-ray reviewed by me shows stable bilateral lung opacities concerning for multifocal pneumonia possibly edema.  Small right pleural effusion.  CT scan of the head without contrast/cervical spine CT shows unchanged encephalomalacia of the anterior right MCA territory in keeping with prior infarction.  Small vessel white matter disease. Type of Study: Bedside Swallow Evaluation Previous Swallow Assessment: 2018; 2021-Cognitive  tx(baseline Cognitive deficits) Diet Prior to this Study: Regular;Thin liquids Temperature Spikes Noted: No (wbc 11.6) Respiratory Status: Nasal cannula (2-3L) History of Recent Intubation: No Behavior/Cognition: Alert;Cooperative;Pleasant mood;Distractible;Requires cueing (min) Oral Cavity Assessment: Dry Oral Care Completed by SLP: Yes Oral Cavity - Dentition: Adequate natural dentition;Missing dentition (few) Vision: Functional for self-feeding Self-Feeding Abilities: Able to feed self;Needs assist;Needs set up (weakness overall) Patient Positioning: Upright in bed (positioning support given) Baseline Vocal Quality: Normal;Low vocal intensity Volitional Cough:  (Fair) Volitional Swallow: Able to elicit    Oral/Motor/Sensory Function Overall Oral Motor/Sensory Function: Within functional limits   Ice Chips Ice chips: Within functional limits Presentation: Spoon (fed; 3 trials)   Thin Liquid Thin Liquid: Within functional limits Presentation: Cup;Self Fed (10 trials)    Nectar Thick Nectar Thick Liquid: Not tested   Honey Thick Honey Thick Liquid: Not tested   Puree Puree: Within functional limits Presentation: Spoon (fed; 10 trials)   Solid     Solid: Within functional limits (adequate) Presentation: Spoon (fed; 5 trials) Other Comments: moistened foods for ease of mastication        Orinda Kenner, MS, CCC-SLP Speech Language Pathologist Rehab Services [phone] [name] 06/09/2021,2:18 PM

## 2021-06-09 NOTE — ED Notes (Signed)
Previous RN sent down labs prior to orders placed. Lab called to run lab work at this time.

## 2021-06-09 NOTE — ED Triage Notes (Signed)
Pt to ED via EMS from [facility], pt was last known to be at baseline at 0500, pt was then checked on around [place] by staff, pt found on floor sats 78%, pt was placed on o2, when ems got there pts sats 98% room air. CBG was 28, pt given 250 D10, cbg to 156 following. Pt responsive, but lethargic.

## 2021-06-10 DIAGNOSIS — G9341 Metabolic encephalopathy: Secondary | ICD-10-CM | POA: Diagnosis not present

## 2021-06-10 LAB — CBC
HCT: 30.7 % — ABNORMAL LOW (ref 39.0–52.0)
Hemoglobin: 9.2 g/dL — ABNORMAL LOW (ref 13.0–17.0)
MCH: 28.2 pg (ref 26.0–34.0)
MCHC: 30 g/dL (ref 30.0–36.0)
MCV: 94.2 fL (ref 80.0–100.0)
Platelets: 173 10*3/uL (ref 150–400)
RBC: 3.26 MIL/uL — ABNORMAL LOW (ref 4.22–5.81)
RDW: 19.8 % — ABNORMAL HIGH (ref 11.5–15.5)
WBC: 9.3 10*3/uL (ref 4.0–10.5)
nRBC: 0 % (ref 0.0–0.2)

## 2021-06-10 LAB — PROCALCITONIN: Procalcitonin: 0.52 ng/mL

## 2021-06-10 LAB — BASIC METABOLIC PANEL
Anion gap: 14 (ref 5–15)
BUN: 41 mg/dL — ABNORMAL HIGH (ref 6–20)
CO2: 26 mmol/L (ref 22–32)
Calcium: 9 mg/dL (ref 8.9–10.3)
Chloride: 94 mmol/L — ABNORMAL LOW (ref 98–111)
Creatinine, Ser: 4.72 mg/dL — ABNORMAL HIGH (ref 0.61–1.24)
GFR, Estimated: 14 mL/min — ABNORMAL LOW (ref >60)
Glucose, Bld: 171 mg/dL — ABNORMAL HIGH (ref 70–99)
Potassium: 4.1 mmol/L (ref 3.5–5.1)
Sodium: 134 mmol/L — ABNORMAL LOW (ref 135–145)

## 2021-06-10 LAB — GLUCOSE, CAPILLARY
Glucose-Capillary: 117 mg/dL — ABNORMAL HIGH (ref 70–99)
Glucose-Capillary: 165 mg/dL — ABNORMAL HIGH (ref 70–99)
Glucose-Capillary: 228 mg/dL — ABNORMAL HIGH (ref 70–99)
Glucose-Capillary: 265 mg/dL — ABNORMAL HIGH (ref 70–99)
Glucose-Capillary: 284 mg/dL — ABNORMAL HIGH (ref 70–99)

## 2021-06-10 LAB — CBG MONITORING, ED: Glucose-Capillary: 193 mg/dL — ABNORMAL HIGH (ref 70–99)

## 2021-06-10 MED ORDER — SODIUM CHLORIDE 0.9 % IV SOLN: 100.0000 mL | INTRAVENOUS | Status: DC | PRN

## 2021-06-10 MED ORDER — LIDOCAINE-PRILOCAINE 2.5-2.5 % EX CREA
1.0000 "application " | TOPICAL_CREAM | CUTANEOUS | Status: DC | PRN
  Filled 2021-06-10: qty 5

## 2021-06-10 MED ORDER — PENTAFLUOROPROP-TETRAFLUOROETH EX AERO
1.0000 "application " | INHALATION_SPRAY | CUTANEOUS | Status: DC | PRN
  Filled 2021-06-10: qty 30

## 2021-06-10 MED ORDER — ALTEPLASE 2 MG IJ SOLR: 2.0000 mg | Freq: Once | INTRAMUSCULAR | Status: DC | PRN

## 2021-06-10 MED ORDER — NEPRO/CARBSTEADY PO LIQD
237.0000 mL | Freq: Two times a day (BID) | ORAL | Status: DC
  Administered 2021-06-10 – 2021-06-13 (×5): 237 mL via ORAL

## 2021-06-10 MED ORDER — HEPARIN SODIUM (PORCINE) 1000 UNIT/ML DIALYSIS
1000.0000 [IU] | INTRAMUSCULAR | Status: DC | PRN
  Filled 2021-06-10: qty 1

## 2021-06-10 MED ORDER — LIDOCAINE HCL (PF) 1 % IJ SOLN
5.0000 mL | INTRAMUSCULAR | Status: DC | PRN
  Filled 2021-06-10: qty 5

## 2021-06-10 NOTE — Progress Notes (Signed)
Treatment Initiated without incident, patient cannulated successfully with 15g needles, push and pull was flawless and no signs of infiltration. Pt is running a blood flow rate of 400 and attempting to remove 1KG.

## 2021-06-10 NOTE — Telephone Encounter (Signed)
Tiffany advised.   Thanks,   -Mickel Baas

## 2021-06-10 NOTE — Progress Notes (Signed)
[facility]  ROUNDING NOTE   Subjective:   [name] was admitted to [facility] on 06/09/2021 for Hypoglycemia [E16.2] HCAP (healthcare-associated pneumonia) [J18.9] Hypothermia, initial encounter [T68.XXXA] Acute metabolic encephalopathy [O67.67] Sepsis with acute hypoxic respiratory failure without septic shock, due to unspecified organism ([place]) [A41.9, R65.20, J96.01]  Patient found to have hypoglycemia and pneumonia. Last hemodialysis treatment was 06/08/21.    Patient seen during dialysis    HEMODIALYSIS FLOWSHEET:  Blood Flow Rate (mL/min): 400 mL/min Arterial Pressure (mmHg): -160 mmHg Venous Pressure (mmHg): 150 mmHg Transmembrane Pressure (mmHg): 70 mmHg Ultrafiltration Rate (mL/min): 500 mL/min Dialysate Flow Rate (mL/min): 500 ml/min Conductivity: Machine : 13.8 Conductivity: Machine : 13.8  No complaints at this time Tolerating treatment well     Objective:  Vital signs in last 24 hours:  Temp:  [97.8 F (36.6 C)-98.8 F (37.1 C)] 98 F (36.7 C) (08/24 1319) Pulse Rate:  [73-92] 87 (08/24 1319) Resp:  [11-28] 17 (08/24 1319) BP: (83-184)/(48-139) 158/80 (08/24 1319) SpO2:  [92 %-100 %] 94 % (08/24 1319) Weight:  [61.1 kg] 61.1 kg (08/24 0638)  Weight change: 1 kg Filed Weights   06/09/21 0654 06/10/21 0638  Weight: 60.1 kg 61.1 kg    Intake/Output: I/O last 3 completed shifts: In: 1054.5 [I.V.:713.6; IV Piggyback:340.9] Out: -    Intake/Output this shift:  Total I/O In: -  Out: 1000 [Other:1000]  Physical Exam: General: NAD,   Head: Normocephalic, atraumatic. Moist oral mucosal membranes  Eyes: Anicteric  Lungs:  Clear to auscultation, normal effort  Heart: Regular rate and rhythm  Abdomen:  Soft, nontender  Extremities:  Bilateral BKA  Neurologic: Alert and oriented x 4. Slow to respond.   Skin: No lesions  Access: Left AVF    Basic Metabolic Panel: Recent Labs  Lab 06/09/21 0626 06/10/21 0809  NA 136 134*  K  3.6 4.1  CL 95* 94*  CO2 31 26  GLUCOSE 106* 171*  BUN 27* 41*  CREATININE 3.62* 4.72*  CALCIUM 9.0 9.0  MG 1.8  --      Liver Function Tests: Recent Labs  Lab 06/09/21 0626  AST 22  ALT 13  ALKPHOS 74  BILITOT 0.4  PROT 7.2  ALBUMIN 2.8*    No results for input(s): LIPASE, AMYLASE in the last 168 hours. No results for input(s): AMMONIA in the last 168 hours.  CBC: Recent Labs  Lab 06/09/21 0626 06/10/21 0809  WBC 11.6* 9.3  NEUTROABS 10.6*  --   HGB 8.5* 9.2*  HCT 28.4* 30.7*  MCV 90.7 94.2  PLT 232 173     Cardiac Enzymes: No results for input(s): CKTOTAL, CKMB, CKMBINDEX, TROPONINI in the last 168 hours.  BNP: Invalid input(s): POCBNP  CBG: Recent Labs  Lab 06/09/21 1143 06/09/21 1616 06/10/21 0351 06/10/21 0802 06/10/21 1316  GLUCAP 105* 124* 193* 165* 117*     Microbiology: Results for orders placed or performed during the hospital encounter of 06/09/21  Resp Panel by RT-PCR (Flu A&B, Covid) Nasopharyngeal Swab     Status: None   Collection Time: 06/09/21  7:24 AM   Specimen: Nasopharyngeal Swab; Nasopharyngeal(NP) swabs in vial transport medium  Result Value Ref Range Status   SARS Coronavirus 2 by RT PCR NEGATIVE NEGATIVE Final    Comment: (NOTE) SARS-CoV-2 target nucleic acids are NOT DETECTED.  The SARS-CoV-2 RNA is generally detectable in upper respiratory specimens during the acute phase of infection. The lowest concentration of SARS-CoV-2 viral copies this assay can  detect is 138 copies/mL. A negative result does not preclude SARS-Cov-2 infection and should not be used as the sole basis for treatment or other patient management decisions. A negative result may occur with  improper specimen collection/handling, submission of specimen other than nasopharyngeal swab, presence of viral mutation(s) within the areas targeted by this assay, and inadequate number of viral copies(<138 copies/mL). A negative result must be combined  with clinical observations, patient history, and epidemiological information. The expected result is Negative.  Fact Sheet for Patients:  EntrepreneurPulse.com.au  Fact Sheet for Healthcare Providers:  IncredibleEmployment.be  This test is no t yet approved or cleared by the [place] FDA and  has been authorized for detection and/or diagnosis of SARS-CoV-2 by FDA under an Emergency Use Authorization (EUA). This EUA will remain  in effect (meaning this test can be used) for the duration of the COVID-19 declaration under Section 564(b)(1) of the Act, 21 U.S.C.section 360bbb-3(b)(1), unless the authorization is terminated  or revoked sooner.       Influenza A by PCR NEGATIVE NEGATIVE Final   Influenza B by PCR NEGATIVE NEGATIVE Final    Comment: (NOTE) The Xpert Xpress SARS-CoV-2/FLU/RSV plus assay is intended as an aid in the diagnosis of influenza from Nasopharyngeal swab specimens and should not be used as a sole basis for treatment. Nasal washings and aspirates are unacceptable for Xpert Xpress SARS-CoV-2/FLU/RSV testing.  Fact Sheet for Patients: EntrepreneurPulse.com.au  Fact Sheet for Healthcare Providers: IncredibleEmployment.be  This test is not yet approved or cleared by the [place] FDA and has been authorized for detection and/or diagnosis of SARS-CoV-2 by FDA under an Emergency Use Authorization (EUA). This EUA will remain in effect (meaning this test can be used) for the duration of the COVID-19 declaration under Section 564(b)(1) of the Act, 21 U.S.C. section 360bbb-3(b)(1), unless the authorization is terminated or revoked.  Performed at [facility], [address] [place], [place] [address]   Culture, blood (routine x 2)     Status: None (Preliminary result)   Collection Time: 06/09/21  7:28 AM   Specimen: BLOOD  Result Value Ref Range Status   Specimen  Description BLOOD RIGHT UPPER ARM  Final   Special Requests   Final    BOTTLES DRAWN AEROBIC AND ANAEROBIC Blood Culture adequate volume   Culture   Final    NO GROWTH < 24 HOURS Performed at [facility], [address], [place], [place] [address]    Report Status PENDING  Incomplete  Culture, blood (routine x 2)     Status: None (Preliminary result)   Collection Time: 06/09/21  8:09 AM   Specimen: BLOOD  Result Value Ref Range Status   Specimen Description BLOOD RIGHT HAND  Final   Special Requests   Final    BOTTLES DRAWN AEROBIC AND ANAEROBIC Blood Culture adequate volume   Culture   Final    NO GROWTH < 24 HOURS Performed at [facility], [address] [place],  [address]    Report Status PENDING  Incomplete  Expectorated Sputum Assessment w Gram Stain, Rflx to Resp Cult     Status: None   Collection Time: 06/09/21  2:28 PM   Specimen: Expectorated Sputum  Result Value Ref Range Status   Specimen Description EXPECTORATED SPUTUM  Final   Special Requests NONE  Final   Sputum evaluation   Final    THIS SPECIMEN IS ACCEPTABLE FOR SPUTUM CULTURE Performed at [facility], [address] [place], [place] [address]    Report Status 06/09/2021 FINAL  Final  Culture, Respiratory w Gram Stain     Status: None (Preliminary result)   Collection Time: 06/09/21  2:28 PM  Result Value Ref Range Status   Specimen Description   Final    EXPECTORATED SPUTUM Performed at [facility], [address] [place], [place] [address]    Special Requests   Final    NONE Reflexed from [id number] Performed at [place], [place] [address]    Gram Stain   Final    FEW SQUAMOUS EPITHELIAL CELLS PRESENT FEW WBC PRESENT,BOTH PMN AND MONONUCLEAR RARE GRAM POSITIVE COCCI IN PAIRS    Culture   Final    TOO YOUNG TO READ Performed at [facility], [address], [place], [place] [address]    Report Status  PENDING  Incomplete    Coagulation Studies: No results for input(s): LABPROT, INR in the last 72 hours.  Urinalysis: Recent Labs    06/09/21 0728  COLORURINE YELLOW*  LABSPEC 1.012  PHURINE 7.0  GLUCOSEU 50*  HGBUR NEGATIVE  BILIRUBINUR NEGATIVE  KETONESUR NEGATIVE  PROTEINUR >=300*  NITRITE NEGATIVE  LEUKOCYTESUR LARGE*       Imaging: CT Head Wo Contrast  Result Date: 06/09/2021 CLINICAL DATA:  Found on floor, unwitnessed fall EXAM: CT HEAD WITHOUT CONTRAST CT CERVICAL SPINE WITHOUT CONTRAST TECHNIQUE: Multidetector CT imaging of the head and cervical spine was performed following the standard protocol without intravenous contrast. Multiplanar CT image reconstructions of the cervical spine were also generated. COMPARISON:  02/06/2021 FINDINGS: CT HEAD FINDINGS Brain: No evidence of acute infarction, hemorrhage, hydrocephalus, extra-axial collection or mass lesion/mass effect. Unchanged encephalomalacia of the anterior right MCA territory (series 2, image 11). Underlying periventricular and deep white matter hypodensity. Vascular: No hyperdense vessel or unexpected calcification. Skull: Normal. Negative for fracture or focal lesion. Sinuses/Orbits: No acute finding. Other: None. CT CERVICAL SPINE FINDINGS Examination of the cervical spine is significantly limited by patient motion artifact, including a repeat acquisition. Alignment: Degenerative straightening of the normal cervical lordosis. Skull base and vertebrae: No acute fracture. No primary bone lesion or focal pathologic process. Soft tissues and spinal canal: No prevertebral fluid or swelling. No visible canal hematoma. Disc levels: Focally moderate disc space height loss and osteophytosis of C6-C7. Disc spaces are otherwise intact. Upper chest: Negative. Other: None. IMPRESSION: 1. No acute intracranial pathology. 2. Unchanged encephalomalacia of the anterior right MCA territory in keeping with prior infarction. 3. Small-vessel  white matter disease. 4. Examination of the cervical spine is significantly limited by patient motion artifact, including a repeat acquisition. Within this limitation, no obvious displaced fracture or static subluxation of the cervical spine. Electronically Signed   By: Eddie Candle M.D.   On: 06/09/2021 09:55   CT CHEST WO CONTRAST  Result Date: 06/09/2021 CLINICAL DATA:  Evaluate for pneumonia, pleural effusion, or abscess. EXAM: CT CHEST WITHOUT CONTRAST TECHNIQUE: Multidetector CT imaging of the chest was performed following the standard protocol without IV contrast. COMPARISON:  05/20/2021 FINDINGS: Cardiovascular: Aortic atherosclerosis. Coronary artery calcifications. Cardiac enlargement. No pericardial effusion. Mediastinum/Nodes: Normal appearance of the thyroid gland. The trachea appears patent and is midline. Normal appearance of the esophagus. Lack of IV contrast material and diffuse edema throughout the mediastinal fat diminishes assessment for adenopathy. Within this limitation previously noted prominent mediastinal lymph nodes appear similar to previous exam. This includes a 1.5 cm low right paratracheal lymph node,  image 62/2. No signs of axillary or supraclavicular adenopathy. Hilar lymph nodes are suboptimally evaluated due to lack of IV contrast material. Lungs/Pleura: Interval increase in volume of bilateral pleural effusions compared with the previous exam. Small chronic appearing fibrothorax with calcifications noted overlying the posterior right base, image 126/2. There is diffuse interlobular septal thickening with ground-glass attenuation throughout both lungs compatible with moderate pulmonary interstitial and airspace edema. New multifocal airspace opacities are noted within the anterior right upper lobe, image 58/3 and posterior left upper lobe, image 74/3. Similar appearance of subsegmental atelectasis within both lower lung zones, right greater than left. Upper Abdomen: No acute  abnormality identified Musculoskeletal: There is diffuse anasarca throughout the soft tissues of the chest. Mild diffuse increased skeletal sclerosis may reflect changes due to renal osteodystrophy. No acute or suspicious bone lesion identified. Chronic right lateral rib fracture appears unchanged, image 32/6. IMPRESSION: 1. Bilateral pleural effusions and moderate pulmonary interstitial and airspace edema compatible with moderate to severe CHF. Compared with the previous exam the pleural effusions have increased in volume. 2. New multifocal airspace opacities are noted within the anterior right upper lobe and posterior left upper lobe. This may represent asymmetric areas of alveolar edema versus pneumonia. 3. Diffuse body wall edema as well as edema throughout the mediastinal fat compatible with anasarca. 4. Aortic atherosclerosis and coronary artery calcifications. Aortic Atherosclerosis (ICD10-I70.0). Electronically Signed   By: Kerby Moors M.D.   On: 06/09/2021 12:45   CT Cervical Spine Wo Contrast  Result Date: 06/09/2021 CLINICAL DATA:  Found on floor, unwitnessed fall EXAM: CT HEAD WITHOUT CONTRAST CT CERVICAL SPINE WITHOUT CONTRAST TECHNIQUE: Multidetector CT imaging of the head and cervical spine was performed following the standard protocol without intravenous contrast. Multiplanar CT image reconstructions of the cervical spine were also generated. COMPARISON:  02/06/2021 FINDINGS: CT HEAD FINDINGS Brain: No evidence of acute infarction, hemorrhage, hydrocephalus, extra-axial collection or mass lesion/mass effect. Unchanged encephalomalacia of the anterior right MCA territory (series 2, image 11). Underlying periventricular and deep white matter hypodensity. Vascular: No hyperdense vessel or unexpected calcification. Skull: Normal. Negative for fracture or focal lesion. Sinuses/Orbits: No acute finding. Other: None. CT CERVICAL SPINE FINDINGS Examination of the cervical spine is significantly  limited by patient motion artifact, including a repeat acquisition. Alignment: Degenerative straightening of the normal cervical lordosis. Skull base and vertebrae: No acute fracture. No primary bone lesion or focal pathologic process. Soft tissues and spinal canal: No prevertebral fluid or swelling. No visible canal hematoma. Disc levels: Focally moderate disc space height loss and osteophytosis of C6-C7. Disc spaces are otherwise intact. Upper chest: Negative. Other: None. IMPRESSION: 1. No acute intracranial pathology. 2. Unchanged encephalomalacia of the anterior right MCA territory in keeping with prior infarction. 3. Small-vessel white matter disease. 4. Examination of the cervical spine is significantly limited by patient motion artifact, including a repeat acquisition. Within this limitation, no obvious displaced fracture or static subluxation of the cervical spine. Electronically Signed   By: Eddie Candle M.D.   On: 06/09/2021 09:55   DG Chest Portable 1 View  Result Date: 06/09/2021 CLINICAL DATA:  Altered mental status. EXAM: PORTABLE CHEST 1 VIEW COMPARISON:  May 18, 2021. FINDINGS: Stable cardiomegaly. Stable bilateral lung opacities are noted concerning for multifocal pneumonia or possibly edema. Right pleural effusion is significantly smaller compared to prior exam. No pneumothorax is noted. Bony thorax is unremarkable. IMPRESSION: Stable bilateral lung opacities are noted concerning for multifocal pneumonia or possibly edema. Right pleural effusion is significantly  smaller. Aortic Atherosclerosis (ICD10-I70.0). Electronically Signed   By: Marijo Conception M.D.   On: 06/09/2021 08:07     Medications:    azithromycin     cefTRIAXone (ROCEPHIN)  IV 2 g (06/10/21 1336)   dextrose 10 % and 0.45 % NaCl 30 mL/hr at 06/10/21 2330    aspirin  81 mg Oral Daily   atorvastatin  80 mg Oral Daily   carvedilol  25 mg Oral BID WC   Chlorhexidine Gluconate Cloth  6 each Topical Q0600   feeding  supplement  237 mL Oral TID BM   feeding supplement (NEPRO CARB STEADY)  237 mL Oral BID BM   gabapentin  400 mg Oral QHS   heparin  5,000 Units Subcutaneous Q8H   hydrALAZINE  100 mg Oral Q8H   levETIRAcetam  250 mg Oral Q M,W,F-HD   levETIRAcetam  500 mg Oral Daily   lisinopril  40 mg Oral Daily   multivitamin  1 tablet Oral QHS   sevelamer carbonate  800 mg Oral TID WC   spironolactone  25 mg Oral Daily     Assessment/ Plan:  [name] is a 60 y.o. black male with end stage renal disease on hemodialysis, diabetes mellitus type II, hypertension, CVA, peripheral vascular disease status post bilateral BKA, who is admitted to [facility] on 06/09/2021 for Hypoglycemia [E16.2] HCAP (healthcare-associated pneumonia) [J18.9] Hypothermia, initial encounter [T68.XXXA] Acute metabolic encephalopathy [Q76.22] Sepsis with acute hypoxic respiratory failure without septic shock, due to unspecified organism ([place]) [A41.9, R65.20, J96.01]  [facility] [address] MWF L AVF   End Stage Renal Disease: MWF schedule. Received dialysis today, UF 1L achieved. Next treatment scheduled for Friday.   Hypertension: 158/80 with history of chronic systolic and diastolic congestive heart failure. home regimen of carvedilol, hydralazine, isosorbide mononitrate, lisinopril, and spironolactone   Anemia of chronic kidney disease: Hemoglobin 9.2.  - EPO with HD treatments.   Secondary Hyperparathyroidism: continue sevelamer with meals.    LOS: 1   8/24/20222:02 PM

## 2021-06-10 NOTE — Progress Notes (Signed)
Noted decrease in patient blood pressure, pt is sitting up eating breakfast at this time. Will monitor the patient closely and will recheck the blood pressure.

## 2021-06-10 NOTE — ED Notes (Signed)
Pt linen changed, brief changed and pt repositioned in bed

## 2021-06-10 NOTE — Progress Notes (Signed)
Patient completed treatment without issue. No cramping or goal decreases. Pt successfully pulled of 1KG.

## 2021-06-10 NOTE — Progress Notes (Signed)
PROGRESS NOTE    [name]  QQI:[id number] DOB: November 22, 1960 DOA: 06/09/2021 PCP: [name], MD   Brief Narrative:  [name] is a 60 y.o. male with medical history significant for DM of end-stage renal disease on dialysis, M/W/F, HTN, seizure disorder, CVA, peripheral arterial disease status post bilateral BKA, hydradenitis suppurativa and cutaneous Crohn's on [name] and as needed Augmentin for flareups, followed by [facility] dermatology, status post recent hospitalization for acute respiratory failure who was discharged to skilled nursing facility for subacute rehab and was sent to the emergency room for evaluation of mental status changes. Found to have profound hypoglycemia at 28 with concurrent hypoxia, concern for re-initiation of insulin at SNF despite being off insulin for years per family discussion.  Assessment & Plan:   Acute metabolic encephalopathy Symptomatic hypoglycemia  Concurrent acute hypoxia (as below) Most likely related to hypoglycemia - possible re-initiation of insulin which patient is not currently on at home per fmaily Improving with Dextrose - follow repeat labs Continue D5W at 30 cc an hour    Uncontrolled diabetes mellitus with complications of end-stage renal disease on hemodialysis as well as hypoglycemia Patient has a history of diabetes mellitus and per his wife has been off insulin for years due to intolerace/hypoglycemia He currently resides in a skilled nursing facility and it is unclear if patient received insulin.  He presented with altered mental status and blood sugar of 28. Continue dextrose infusion until PO intake improves Dialysis days are M/W/F  Acute hypoxic respiratory failure secondary to pneumonia Cannot rule out aspiration vs CAP Noted on chest x-ray Patient recently discharged from the hospital and completed antibiotic therapy for CAP Cover prophylactically with azithromycin and ceftriaxone -low threshold to discontinue in the  next 24 hours if no worsening symptoms CT shows bilateral airspace disease and mild effusions Speech therapy consult for swallow function evaluation  Chronic anemia of chronic disease - ESRD H/H Stable Continue iron supplements   History of CVA (cerebrovascular accident) Post stroke seizure disorder and cognitive deficit CT head with no acute findings and patient with no focal deficit Continue antiplatelets and statins  Continue Keppra for seizure prophylaxis   Peripheral arterial disease S/P bilateral BKA (below knee amputation) ([place])  Increase nursing assistance for transfers.  Trapeze above bed if  desired Continue statins, aspirin and metoprolol  Chronic combined systolic and diastolic CHF (congestive heart failure) ([place]) Not acutely exacerbated on clincal exam Last echo from August 2022 with EF 35 to 40% and grade 2 diastolic dysfunction Continue lisinopril, spironolactone and carvedilol Volume management with HD   Essential hypertension Continue nitrates, lisinopril, Coreg and hydralazine   DVT prophylaxis: Heparin  Code Status: full code  Family Communication: At bedside  Status is: Inpatient  Dispo: The patient is from: SNF              Anticipated d/c is to: SNF              Anticipated d/c date is: 48-72h              Patient currently not medically stable for discharge  Consultants:  Nephrology  Procedures:  None  Antimicrobials:  Azithromycin, ceftriaxone  Subjective: No acute issues or events overnight denies nausea vomiting diarrhea constipation headache fevers chills or chest pain  Objective: Vitals:   06/10/21 0334 06/10/21 0500 06/10/21 0511 06/10/21 0638  BP: (!) 160/56 (!) 180/61 (!) 180/61 (!) 170/89  Pulse: 92 85  88  Resp: 11 18  20  Temp:    98.8 F (37.1 C)  TempSrc:    Oral  SpO2: 97% 95%    Weight:    61.1 kg  Height:        Intake/Output Summary (Last 24 hours) at 06/10/2021 0801 Last data filed at 06/10/2021 8315 Gross per 24  hour  Intake 1054.51 ml  Output --  Net 1054.51 ml   Filed Weights   06/09/21 0654 06/10/21 0638  Weight: 60.1 kg 61.1 kg    Examination:  General exam: Appears calm and comfortable  Respiratory system: Clear to auscultation. Respiratory effort normal. Cardiovascular system: S1 & S2 heard, RRR. No JVD, murmurs, rubs, gallops or clicks. No pedal edema. Gastrointestinal system: Abdomen is nondistended, soft and nontender. No organomegaly or masses felt. Normal bowel sounds heard. Central nervous system: Alert and oriented. No focal neurological deficits. Extremities: Symmetric 5 x 5 power. Skin: No rashes, lesions or ulcers Psychiatry: Judgement and insight appear normal. Mood & affect appropriate.     Data Reviewed: I have personally reviewed following labs and imaging studies  CBC: Recent Labs  Lab 06/09/21 0626  WBC 11.6*  NEUTROABS 10.6*  HGB 8.5*  HCT 28.4*  MCV 90.7  PLT 176   Basic Metabolic Panel: Recent Labs  Lab 06/09/21 0626  NA 136  K 3.6  CL 95*  CO2 31  GLUCOSE 106*  BUN 27*  CREATININE 3.62*  CALCIUM 9.0  MG 1.8   GFR: Estimated Creatinine Clearance: 19.2 mL/min (A) (by C-G formula based on SCr of 3.62 mg/dL (H)). Liver Function Tests: Recent Labs  Lab 06/09/21 0626  AST 22  ALT 13  ALKPHOS 74  BILITOT 0.4  PROT 7.2  ALBUMIN 2.8*   No results for input(s): LIPASE, AMYLASE in the last 168 hours. No results for input(s): AMMONIA in the last 168 hours. Coagulation Profile: No results for input(s): INR, PROTIME in the last 168 hours. Cardiac Enzymes: No results for input(s): CKTOTAL, CKMB, CKMBINDEX, TROPONINI in the last 168 hours. BNP (last 3 results) No results for input(s): PROBNP in the last 8760 hours. HbA1C: No results for input(s): HGBA1C in the last 72 hours. CBG: Recent Labs  Lab 06/09/21 0744 06/09/21 0941 06/09/21 1143 06/09/21 1616 06/10/21 0351  GLUCAP 59* 103* 105* 124* 193*   Lipid Profile: No results for  input(s): CHOL, HDL, LDLCALC, TRIG, CHOLHDL, LDLDIRECT in the last 72 hours. Thyroid Function Tests: No results for input(s): TSH, T4TOTAL, FREET4, T3FREE, THYROIDAB in the last 72 hours. Anemia Panel: No results for input(s): VITAMINB12, FOLATE, FERRITIN, TIBC, IRON, RETICCTPCT in the last 72 hours. Sepsis Labs: Recent Labs  Lab 06/09/21 0626 06/09/21 0935  PROCALCITON 0.35  --   LATICACIDVEN 1.0 0.6    Recent Results (from the past 240 hour(s))  Resp Panel by RT-PCR (Flu A&B, Covid) Nasopharyngeal Swab     Status: None   Collection Time: 06/09/21  7:24 AM   Specimen: Nasopharyngeal Swab; Nasopharyngeal(NP) swabs in vial transport medium  Result Value Ref Range Status   SARS Coronavirus 2 by RT PCR NEGATIVE NEGATIVE Final    Comment: (NOTE) SARS-CoV-2 target nucleic acids are NOT DETECTED.  The SARS-CoV-2 RNA is generally detectable in upper respiratory specimens during the acute phase of infection. The lowest concentration of SARS-CoV-2 viral copies this assay can detect is 138 copies/mL. A negative result does not preclude SARS-Cov-2 infection and should not be used as the sole basis for treatment or other patient management decisions. A negative result may  occur with  improper specimen collection/handling, submission of specimen other than nasopharyngeal swab, presence of viral mutation(s) within the areas targeted by this assay, and inadequate number of viral copies(<138 copies/mL). A negative result must be combined with clinical observations, patient history, and epidemiological information. The expected result is Negative.  Fact Sheet for Patients:  EntrepreneurPulse.com.au  Fact Sheet for Healthcare Providers:  IncredibleEmployment.be  This test is no t yet approved or cleared by the [place] FDA and  has been authorized for detection and/or diagnosis of SARS-CoV-2 by FDA under an Emergency Use Authorization (EUA). This EUA  will remain  in effect (meaning this test can be used) for the duration of the COVID-19 declaration under Section 564(b)(1) of the Act, 21 U.S.C.section 360bbb-3(b)(1), unless the authorization is terminated  or revoked sooner.       Influenza A by PCR NEGATIVE NEGATIVE Final   Influenza B by PCR NEGATIVE NEGATIVE Final    Comment: (NOTE) The Xpert Xpress SARS-CoV-2/FLU/RSV plus assay is intended as an aid in the diagnosis of influenza from Nasopharyngeal swab specimens and should not be used as a sole basis for treatment. Nasal washings and aspirates are unacceptable for Xpert Xpress SARS-CoV-2/FLU/RSV testing.  Fact Sheet for Patients: EntrepreneurPulse.com.au  Fact Sheet for Healthcare Providers: IncredibleEmployment.be  This test is not yet approved or cleared by the [place] FDA and has been authorized for detection and/or diagnosis of SARS-CoV-2 by FDA under an Emergency Use Authorization (EUA). This EUA will remain in effect (meaning this test can be used) for the duration of the COVID-19 declaration under Section 564(b)(1) of the Act, 21 U.S.C. section 360bbb-3(b)(1), unless the authorization is terminated or revoked.  Performed at [facility], [address] [place], [place] [address]   Culture, blood (routine x 2)     Status: None (Preliminary result)   Collection Time: 06/09/21  7:28 AM   Specimen: BLOOD  Result Value Ref Range Status   Specimen Description BLOOD RIGHT UPPER ARM  Final   Special Requests   Final    BOTTLES DRAWN AEROBIC AND ANAEROBIC Blood Culture adequate volume   Culture   Final    NO GROWTH < 24 HOURS Performed at [facility], [address] [place], [place] [address]    Report Status PENDING  Incomplete  Culture, blood (routine x 2)     Status: None (Preliminary result)   Collection Time: 06/09/21  8:09 AM   Specimen: BLOOD  Result Value Ref Range Status   Specimen  Description BLOOD RIGHT HAND  Final   Special Requests   Final    BOTTLES DRAWN AEROBIC AND ANAEROBIC Blood Culture adequate volume   Culture   Final    NO GROWTH < 24 HOURS Performed at [facility], [address] [place], [place] [address]    Report Status PENDING  Incomplete  Expectorated Sputum Assessment w Gram Stain, Rflx to Resp Cult     Status: None   Collection Time: 06/09/21  2:28 PM   Specimen: Expectorated Sputum  Result Value Ref Range Status   Specimen Description EXPECTORATED SPUTUM  Final   Special Requests NONE  Final   Sputum evaluation   Final    THIS SPECIMEN IS ACCEPTABLE FOR SPUTUM CULTURE Performed at [facility], [address] [place], [place] [address]    Report Status 06/09/2021 FINAL  Final  Culture, Respiratory w Gram Stain     Status: None (Preliminary result)   Collection Time: 06/09/21  2:28 PM  Result Value Ref Range Status   Specimen Description   Final    EXPECTORATED SPUTUM Performed at [facility], [address] [place], [place] [address]    Special Requests   Final    NONE Reflexed from [id number] Performed at [facility], [place], [place] [address]    Gram Stain   Final    FEW SQUAMOUS EPITHELIAL CELLS PRESENT FEW WBC PRESENT,BOTH PMN AND MONONUCLEAR RARE GRAM POSITIVE COCCI IN PAIRS Performed at [facility], [place] [address], [place], [place] [address]    Culture PENDING  Incomplete   Report Status PENDING  Incomplete         Radiology Studies: CT Head Wo Contrast  Result Date: 06/09/2021 CLINICAL DATA:  Found on floor, unwitnessed fall EXAM: CT HEAD WITHOUT CONTRAST CT CERVICAL SPINE WITHOUT CONTRAST TECHNIQUE: Multidetector CT imaging of the head and cervical spine was performed following the standard protocol without intravenous contrast. Multiplanar CT image reconstructions of the cervical spine were also generated. COMPARISON:  02/06/2021 FINDINGS: CT HEAD  FINDINGS Brain: No evidence of acute infarction, hemorrhage, hydrocephalus, extra-axial collection or mass lesion/mass effect. Unchanged encephalomalacia of the anterior right MCA territory (series 2, image 11). Underlying periventricular and deep white matter hypodensity. Vascular: No hyperdense vessel or unexpected calcification. Skull: Normal. Negative for fracture or focal lesion. Sinuses/Orbits: No acute finding. Other: None. CT CERVICAL SPINE FINDINGS Examination of the cervical spine is significantly limited by patient motion artifact, including a repeat acquisition. Alignment: Degenerative straightening of the normal cervical lordosis. Skull base and vertebrae: No acute fracture. No primary bone lesion or focal pathologic process. Soft tissues and spinal canal: No prevertebral fluid or swelling. No visible canal hematoma. Disc levels: Focally moderate disc space height loss and osteophytosis of C6-C7. Disc spaces are otherwise intact. Upper chest: Negative. Other: None. IMPRESSION: 1. No acute intracranial pathology. 2. Unchanged encephalomalacia of the anterior right MCA territory in keeping with prior infarction. 3. Small-vessel white matter disease. 4. Examination of the cervical spine is significantly limited by patient motion artifact, including a repeat acquisition. Within this limitation, no obvious displaced fracture or static subluxation of the cervical spine. Electronically Signed   By: Eddie Candle M.D.   On: 06/09/2021 09:55   CT CHEST WO CONTRAST  Result Date: 06/09/2021 CLINICAL DATA:  Evaluate for pneumonia, pleural effusion, or abscess. EXAM: CT CHEST WITHOUT CONTRAST TECHNIQUE: Multidetector CT imaging of the chest was performed following the standard protocol without IV contrast. COMPARISON:  05/20/2021 FINDINGS: Cardiovascular: Aortic atherosclerosis. Coronary artery calcifications. Cardiac enlargement. No pericardial effusion. Mediastinum/Nodes: Normal appearance of the thyroid gland.  The trachea appears patent and is midline. Normal appearance of the esophagus. Lack of IV contrast material and diffuse edema throughout the mediastinal fat diminishes assessment for adenopathy. Within this limitation previously noted prominent mediastinal lymph nodes appear similar to previous exam. This includes a 1.5 cm low right paratracheal lymph node, image 62/2. No signs of axillary or supraclavicular adenopathy. Hilar lymph nodes are suboptimally evaluated due to lack of IV contrast material. Lungs/Pleura: Interval increase in volume of bilateral pleural effusions compared with the previous exam. Small chronic appearing fibrothorax with calcifications noted overlying the posterior right base, image 126/2. There is diffuse interlobular septal thickening with ground-glass attenuation throughout both lungs compatible with moderate pulmonary interstitial and airspace edema. New multifocal airspace opacities are noted within the anterior right upper lobe, image 58/3 and posterior left upper lobe, image 74/3. Similar appearance  of subsegmental atelectasis within both lower lung zones, right greater than left. Upper Abdomen: No acute abnormality identified Musculoskeletal: There is diffuse anasarca throughout the soft tissues of the chest. Mild diffuse increased skeletal sclerosis may reflect changes due to renal osteodystrophy. No acute or suspicious bone lesion identified. Chronic right lateral rib fracture appears unchanged, image 32/6. IMPRESSION: 1. Bilateral pleural effusions and moderate pulmonary interstitial and airspace edema compatible with moderate to severe CHF. Compared with the previous exam the pleural effusions have increased in volume. 2. New multifocal airspace opacities are noted within the anterior right upper lobe and posterior left upper lobe. This may represent asymmetric areas of alveolar edema versus pneumonia. 3. Diffuse body wall edema as well as edema throughout the mediastinal fat  compatible with anasarca. 4. Aortic atherosclerosis and coronary artery calcifications. Aortic Atherosclerosis (ICD10-I70.0). Electronically Signed   By: Kerby Moors M.D.   On: 06/09/2021 12:45   CT Cervical Spine Wo Contrast  Result Date: 06/09/2021 CLINICAL DATA:  Found on floor, unwitnessed fall EXAM: CT HEAD WITHOUT CONTRAST CT CERVICAL SPINE WITHOUT CONTRAST TECHNIQUE: Multidetector CT imaging of the head and cervical spine was performed following the standard protocol without intravenous contrast. Multiplanar CT image reconstructions of the cervical spine were also generated. COMPARISON:  02/06/2021 FINDINGS: CT HEAD FINDINGS Brain: No evidence of acute infarction, hemorrhage, hydrocephalus, extra-axial collection or mass lesion/mass effect. Unchanged encephalomalacia of the anterior right MCA territory (series 2, image 11). Underlying periventricular and deep white matter hypodensity. Vascular: No hyperdense vessel or unexpected calcification. Skull: Normal. Negative for fracture or focal lesion. Sinuses/Orbits: No acute finding. Other: None. CT CERVICAL SPINE FINDINGS Examination of the cervical spine is significantly limited by patient motion artifact, including a repeat acquisition. Alignment: Degenerative straightening of the normal cervical lordosis. Skull base and vertebrae: No acute fracture. No primary bone lesion or focal pathologic process. Soft tissues and spinal canal: No prevertebral fluid or swelling. No visible canal hematoma. Disc levels: Focally moderate disc space height loss and osteophytosis of C6-C7. Disc spaces are otherwise intact. Upper chest: Negative. Other: None. IMPRESSION: 1. No acute intracranial pathology. 2. Unchanged encephalomalacia of the anterior right MCA territory in keeping with prior infarction. 3. Small-vessel white matter disease. 4. Examination of the cervical spine is significantly limited by patient motion artifact, including a repeat acquisition. Within  this limitation, no obvious displaced fracture or static subluxation of the cervical spine. Electronically Signed   By: Eddie Candle M.D.   On: 06/09/2021 09:55   DG Chest Portable 1 View  Result Date: 06/09/2021 CLINICAL DATA:  Altered mental status. EXAM: PORTABLE CHEST 1 VIEW COMPARISON:  May 18, 2021. FINDINGS: Stable cardiomegaly. Stable bilateral lung opacities are noted concerning for multifocal pneumonia or possibly edema. Right pleural effusion is significantly smaller compared to prior exam. No pneumothorax is noted. Bony thorax is unremarkable. IMPRESSION: Stable bilateral lung opacities are noted concerning for multifocal pneumonia or possibly edema. Right pleural effusion is significantly smaller. Aortic Atherosclerosis (ICD10-I70.0). Electronically Signed   By: Marijo Conception M.D.   On: 06/09/2021 08:07        Scheduled Meds:  aspirin  81 mg Oral Daily   atorvastatin  80 mg Oral Daily   carvedilol  25 mg Oral BID WC   Chlorhexidine Gluconate Cloth  6 each Topical Q0600   feeding supplement  237 mL Oral TID BM   gabapentin  400 mg Oral QHS   heparin  5,000 Units Subcutaneous Q8H   hydrALAZINE  100  mg Oral Q8H   levETIRAcetam  250 mg Oral Q M,W,F-HD   levETIRAcetam  500 mg Oral Daily   lisinopril  40 mg Oral Daily   multivitamin  1 tablet Oral QHS   sevelamer carbonate  800 mg Oral TID WC   spironolactone  25 mg Oral Daily   Continuous Infusions:  sodium chloride     sodium chloride     azithromycin     cefTRIAXone (ROCEPHIN)  IV Stopped (06/09/21 1154)   dextrose 10 % and 0.45 % NaCl 30 mL/hr at 06/10/21 0638     LOS: 1 day   Time spent: 60mn  Granvil Djordjevic C Zigmond Trela, DO [facility]  If 7PM-7AM, please contact night-coverage [email or web address]  06/10/2021, 8:01 AM

## 2021-06-10 NOTE — Progress Notes (Signed)
Initial Nutrition Assessment  DOCUMENTATION CODES:   Severe malnutrition in context of chronic illness  INTERVENTION:  Recommend liberalizing diet to regular.  Continue Ensure Plus TID.  Add Nepro Shake po BID, each supplement provides 425 kcal and 19 grams protein,  Continue Rena-Vite daily.  NUTRITION DIAGNOSIS:  Severe Malnutrition related to chronic illness (ESRD on HD) as evidenced by severe fat depletion, severe muscle depletion, percent weight loss.  GOAL:  Patient will meet greater than or equal to 90% of their needs  MONITOR:  PO intake, Supplement acceptance, Diet advancement, Labs, Weight trends, I & O's  REASON FOR ASSESSMENT:  Malnutrition Screening Tool    ASSESSMENT:  60 yo male with a PMH of T2DM, ESRD on HD MWF, HTN, seizure disorder, CVA, PAD s/p bilateral BKA, hydradenitis suppurativa and cutaneous Crohn's on, followed by [facility] dermatology, status post recent hospitalization for acute respiratory failure who was discharged to SNF for subacute rehab and was sent to the emergency room for evaluation of mental status changes. Admitted with AMS.  Pt still confused and unable to provide much history. History obtained from chart and previous RD notes from about 1 month prior.  Per Epic, pt continuing to lose weight from previous admissions. Pt has lost 12 lbs (8.3%) in the last 6 months, which is significant and severe for the time frame.  Pt with severe depletions throughout his body.  Recommend continuing Ensure Plus TID and adding Nepro shakes BID. Continue Rena-Vite daily also.   Also recommend liberalizing diet to regular given good lab values. Secure-chatted MD regarding this.  Medications: reviewed; EE TID, Keppra MWF, Rena-Vite, Renvela TID, spironolactone, D10 with NaCl @ 30 ml/hr via IV  Labs: reviewed; Na 134 (L), CBG 103-193 (H) HbA1c: 6% (05/18/2021)  NUTRITION - FOCUSED PHYSICAL EXAM: Flowsheet Row Most Recent Value  Orbital Region Severe  depletion  Upper Arm Region Severe depletion  Thoracic and Lumbar Region Severe depletion  Buccal Region Severe depletion  Temple Region Severe depletion  Clavicle Bone Region Severe depletion  Clavicle and Acromion Bone Region Severe depletion  Scapular Bone Region Severe depletion  Dorsal Hand Severe depletion  Patellar Region Severe depletion  Anterior Thigh Region Severe depletion  Posterior Calf Region Severe depletion  Edema (RD Assessment) None  Hair Reviewed  Eyes Reviewed  Mouth Reviewed  Skin Reviewed  Nails Reviewed   Diet Order:   Diet Order             Diet renal with fluid restriction Fluid restriction: 1200 mL Fluid; Room service appropriate? Yes; Fluid consistency: Thin  Diet effective now                  EDUCATION NEEDS:  Not appropriate for education at this time  Skin:  Skin Assessment: Skin Integrity Issues: Skin Integrity Issues:: Stage II Stage II: Sacrum  Last BM:  06/10/21 - Type 2, green, medium  Height:  Ht Readings from Last 1 Encounters:  06/09/21 5' 11"  (1.803 m)   Weight:  Wt Readings from Last 1 Encounters:  06/10/21 61.1 kg   BMI:  Body mass index is 18.79 kg/m.  Estimated Nutritional Needs:  Kcal:  2000-2200 Protein:  95-105 grams Fluid:  UOP + 1000 ml  Derrel Nip, RD, LDN (she/her/hers) Registered Dietitian I After-Hours/Weekend Pager # in [place]

## 2021-06-10 NOTE — ED Notes (Signed)
Pt asleep in bed.

## 2021-06-11 DIAGNOSIS — G9341 Metabolic encephalopathy: Secondary | ICD-10-CM | POA: Diagnosis not present

## 2021-06-11 LAB — BASIC METABOLIC PANEL
Anion gap: 13 (ref 5–15)
BUN: 32 mg/dL — ABNORMAL HIGH (ref 6–20)
CO2: 29 mmol/L (ref 22–32)
Calcium: 8.6 mg/dL — ABNORMAL LOW (ref 8.9–10.3)
Chloride: 93 mmol/L — ABNORMAL LOW (ref 98–111)
Creatinine, Ser: 3.43 mg/dL — ABNORMAL HIGH (ref 0.61–1.24)
GFR, Estimated: 20 mL/min — ABNORMAL LOW (ref >60)
Glucose, Bld: 143 mg/dL — ABNORMAL HIGH (ref 70–99)
Potassium: 4.2 mmol/L (ref 3.5–5.1)
Sodium: 135 mmol/L (ref 135–145)

## 2021-06-11 LAB — GLUCOSE, CAPILLARY
Glucose-Capillary: 141 mg/dL — ABNORMAL HIGH (ref 70–99)
Glucose-Capillary: 161 mg/dL — ABNORMAL HIGH (ref 70–99)
Glucose-Capillary: 165 mg/dL — ABNORMAL HIGH (ref 70–99)
Glucose-Capillary: 166 mg/dL — ABNORMAL HIGH (ref 70–99)
Glucose-Capillary: 196 mg/dL — ABNORMAL HIGH (ref 70–99)
Glucose-Capillary: 261 mg/dL — ABNORMAL HIGH (ref 70–99)

## 2021-06-11 LAB — CBC
HCT: 25.7 % — ABNORMAL LOW (ref 39.0–52.0)
Hemoglobin: 7.9 g/dL — ABNORMAL LOW (ref 13.0–17.0)
MCH: 27.8 pg (ref 26.0–34.0)
MCHC: 30.7 g/dL (ref 30.0–36.0)
MCV: 90.5 fL (ref 80.0–100.0)
Platelets: 156 10*3/uL (ref 150–400)
RBC: 2.84 MIL/uL — ABNORMAL LOW (ref 4.22–5.81)
RDW: 19.3 % — ABNORMAL HIGH (ref 11.5–15.5)
WBC: 7.9 10*3/uL (ref 4.0–10.5)
nRBC: 0 % (ref 0.0–0.2)

## 2021-06-11 MED ORDER — EPOETIN ALFA 10000 UNIT/ML IJ SOLN
10000.0000 [IU] | INTRAMUSCULAR | Status: DC
  Administered 2021-06-12: 10000 [IU] via INTRAVENOUS

## 2021-06-11 MED ORDER — DEXTROSE 50 % IV SOLN
INTRAVENOUS | Status: AC
  Filled 2021-06-11: qty 50

## 2021-06-11 NOTE — Plan of Care (Signed)
  Problem: Education: Goal: Knowledge of General Education information will improve Description Including pain rating scale, medication(s)/side effects and non-pharmacologic comfort measures Outcome: Progressing   Problem: Health Behavior/Discharge Planning: Goal: Ability to manage health-related needs will improve Outcome: Progressing   

## 2021-06-11 NOTE — Plan of Care (Signed)
  Problem: Health Behavior/Discharge Planning: Goal: Ability to manage health-related needs will improve Outcome: Progressing   Problem: Clinical Measurements: Goal: Respiratory complications will improve Outcome: Progressing   Problem: Clinical Measurements: Goal: Cardiovascular complication will be avoided Outcome: Progressing   Problem: Elimination: Goal: Will not experience complications related to bowel motility Outcome: Progressing   Problem: Safety: Goal: Ability to remain free from injury will improve Outcome: Progressing

## 2021-06-11 NOTE — Progress Notes (Signed)
Pt have a continous D10 % 0.45% NaCl infusion at 30/ml/hr continous.Pt blood sugar at 1651 at 265, 1948 at 284 and 2334 at 228. Pt is on blood sugar every 4 hours. Talked to NP Randol Kern and ordered to discontinue fluids att his time but keep monitoring blood sugar every 4 hours. Will continue to monitor.

## 2021-06-11 NOTE — Progress Notes (Addendum)
PROGRESS NOTE    [name]  TWS:[id number] DOB: 1961-06-24 DOA: 06/09/2021 PCP: [name], MD   Brief Narrative:  [name] is a 60 y.o. male with medical history significant for DM of end-stage renal disease on dialysis, M/W/F, HTN, seizure disorder, CVA, peripheral arterial disease status post bilateral BKA, hydradenitis suppurativa and cutaneous Crohn's on [name] and as needed Augmentin for flareups, followed by [facility] dermatology, status post recent hospitalization for acute respiratory failure who was discharged to skilled nursing facility for subacute rehab and was sent to the emergency room for evaluation of mental status changes. Found to have profound hypoglycemia at 29 with concurrent hypoxia, concern for re-initiation of insulin at SNF despite being off insulin for years per family discussion.  Assessment & Plan:  Acute metabolic encephalopathy on chronic vascular dementia Symptomatic hypoglycemia  Concurrent acute hypoxia (as below) Most likely related to hypoglycemia - possible re-initiation of insulin which patient is not currently on at home per fmaily Initially requiring dextrose IV fluids, now tolerating p.o. more appropriately IV fluids have been discontinued Baseline vascular dementia per wife, worsening over the past 6 months to year being followed up with neurology in the outpatient setting   Uncontrolled diabetes mellitus with complications of end-stage renal disease on hemodialysis as well as hypoglycemia Patient has a history of diabetes mellitus and per his wife has been off insulin for years due to intolerace/hypoglycemia Given prolonged hypoglycemia concern the patient received long-acting glargine/similar insulin outpatient Dextrose infusion discontinued given increased p.o. intake more appropriate, glucose currently well controlled Dialysis days are M/W/F  Acute hypoxic respiratory failure secondary to pneumonia Cannot rule out aspiration vs  CAP Noted on chest x-ray Patient recently discharged from the hospital and completed antibiotic therapy for CAP Cover prophylactically with azithromycin and ceftriaxone -low threshold to discontinue in the next 24 hours if no worsening symptoms CT shows bilateral airspace disease and mild effusions Speech therapy consult for swallow function evaluation  Chronic anemia of chronic disease - ESRD H/H Stable Continue iron supplements   History of CVA (cerebrovascular accident) Concurrent vascular dementia, chronic Post stroke seizure disorder and cognitive deficit CT head with no acute findings and patient with no focal deficit Continue antiplatelets and statins  Continue Keppra for seizure prophylaxis   Peripheral arterial disease S/P bilateral BKA (below knee amputation) ([place])  Increase nursing assistance for transfers.  Trapeze above bed if  desired Continue statins, aspirin and metoprolol  Chronic combined systolic and diastolic CHF (congestive heart failure) ([place]) Not acutely exacerbated on clincal exam Last echo from August 2022 with EF 35 to 40% and grade 2 diastolic dysfunction Continue lisinopril, spironolactone and carvedilol Volume management with HD   Essential hypertension Continue nitrates, lisinopril, Coreg and hydralazine   DVT prophylaxis: Heparin  Code Status: full code  Family Communication: At bedside  Status is: Inpatient  Dispo: The patient is from: SNF              Anticipated d/c is to: SNF              Anticipated d/c date is: 48-72h              Patient currently not medically stable for discharge  Consultants:  Nephrology  Procedures:  None  Antimicrobials:  Azithromycin, ceftriaxone  Subjective: No acute issues or events overnight denies nausea vomiting diarrhea constipation headache fevers chills or chest pain  Objective: Vitals:   06/10/21 1652 06/10/21 1955 06/10/21 2337 06/11/21 0425  BP: Marland Kitchen)  141/47 (!) 147/75 (!) 138/52 (!) 144/52   Pulse: 88 79 78 79  Resp: 17 20 20 20   Temp: 98.3 F (36.8 C) 98.3 F (36.8 C) 98.4 F (36.9 C) 97.7 F (36.5 C)  TempSrc: Oral Oral Oral Oral  SpO2:  98% 98% 98%  Weight:      Height:        Intake/Output Summary (Last 24 hours) at 06/11/2021 0750 Last data filed at 06/11/2021 0551 Gross per 24 hour  Intake 169.79 ml  Output 1000 ml  Net -830.21 ml    Filed Weights   06/09/21 0654 06/10/21 0638  Weight: 60.1 kg 61.1 kg    Examination:  General exam: Appears calm and comfortable  Respiratory system: Clear to auscultation. Respiratory effort normal. Cardiovascular system: S1 & S2 heard, RRR. No JVD, murmurs, rubs, gallops or clicks. No pedal edema. Gastrointestinal system: Abdomen is nondistended, soft and nontender. No organomegaly or masses felt. Normal bowel sounds heard. Central nervous system: Alert and oriented. No focal neurological deficits. Extremities: Symmetric 5 x 5 power. BLE BKA Skin: No rashes, lesions or ulcers Psychiatry: Judgement and insight appear normal. Mood & affect appropriate.     Data Reviewed: I have personally reviewed following labs and imaging studies  CBC: Recent Labs  Lab 06/09/21 0626 06/10/21 0809 06/11/21 0538  WBC 11.6* 9.3 7.9  NEUTROABS 10.6*  --   --   HGB 8.5* 9.2* 7.9*  HCT 28.4* 30.7* 25.7*  MCV 90.7 94.2 90.5  PLT 232 173 557    Basic Metabolic Panel: Recent Labs  Lab 06/09/21 0626 06/10/21 0809 06/11/21 0538  NA 136 134* 135  K 3.6 4.1 4.2  CL 95* 94* 93*  CO2 31 26 29   GLUCOSE 106* 171* 143*  BUN 27* 41* 32*  CREATININE 3.62* 4.72* 3.43*  CALCIUM 9.0 9.0 8.6*  MG 1.8  --   --     GFR: Estimated Creatinine Clearance: 20.3 mL/min (A) (by C-G formula based on SCr of 3.43 mg/dL (H)). Liver Function Tests: Recent Labs  Lab 06/09/21 0626  AST 22  ALT 13  ALKPHOS 74  BILITOT 0.4  PROT 7.2  ALBUMIN 2.8*    No results for input(s): LIPASE, AMYLASE in the last 168 hours. No results for input(s):  AMMONIA in the last 168 hours. Coagulation Profile: No results for input(s): INR, PROTIME in the last 168 hours. Cardiac Enzymes: No results for input(s): CKTOTAL, CKMB, CKMBINDEX, TROPONINI in the last 168 hours. BNP (last 3 results) No results for input(s): PROBNP in the last 8760 hours. HbA1C: No results for input(s): HGBA1C in the last 72 hours. CBG: Recent Labs  Lab 06/10/21 1316 06/10/21 1651 06/10/21 1948 06/10/21 2334 06/11/21 0512  GLUCAP 117* 265* 284* 228* 161*    Lipid Profile: No results for input(s): CHOL, HDL, LDLCALC, TRIG, CHOLHDL, LDLDIRECT in the last 72 hours. Thyroid Function Tests: No results for input(s): TSH, T4TOTAL, FREET4, T3FREE, THYROIDAB in the last 72 hours. Anemia Panel: No results for input(s): VITAMINB12, FOLATE, FERRITIN, TIBC, IRON, RETICCTPCT in the last 72 hours. Sepsis Labs: Recent Labs  Lab 06/09/21 0626 06/09/21 0935 06/10/21 0809  PROCALCITON 0.35  --  0.52  LATICACIDVEN 1.0 0.6  --      Recent Results (from the past 240 hour(s))  Resp Panel by RT-PCR (Flu A&B, Covid) Nasopharyngeal Swab     Status: None   Collection Time: 06/09/21  7:24 AM   Specimen: Nasopharyngeal Swab; Nasopharyngeal(NP) swabs in vial transport medium  Result Value Ref Range Status   SARS Coronavirus 2 by RT PCR NEGATIVE NEGATIVE Final    Comment: (NOTE) SARS-CoV-2 target nucleic acids are NOT DETECTED.  The SARS-CoV-2 RNA is generally detectable in upper respiratory specimens during the acute phase of infection. The lowest concentration of SARS-CoV-2 viral copies this assay can detect is 138 copies/mL. A negative result does not preclude SARS-Cov-2 infection and should not be used as the sole basis for treatment or other patient management decisions. A negative result may occur with  improper specimen collection/handling, submission of specimen other than nasopharyngeal swab, presence of viral mutation(s) within the areas targeted by this assay, and  inadequate number of viral copies(<138 copies/mL). A negative result must be combined with clinical observations, patient history, and epidemiological information. The expected result is Negative.  Fact Sheet for Patients:  EntrepreneurPulse.com.au  Fact Sheet for Healthcare Providers:  IncredibleEmployment.be  This test is no t yet approved or cleared by the [place] FDA and  has been authorized for detection and/or diagnosis of SARS-CoV-2 by FDA under an Emergency Use Authorization (EUA). This EUA will remain  in effect (meaning this test can be used) for the duration of the COVID-19 declaration under Section 564(b)(1) of the Act, 21 U.S.C.section 360bbb-3(b)(1), unless the authorization is terminated  or revoked sooner.       Influenza A by PCR NEGATIVE NEGATIVE Final   Influenza B by PCR NEGATIVE NEGATIVE Final    Comment: (NOTE) The Xpert Xpress SARS-CoV-2/FLU/RSV plus assay is intended as an aid in the diagnosis of influenza from Nasopharyngeal swab specimens and should not be used as a sole basis for treatment. Nasal washings and aspirates are unacceptable for Xpert Xpress SARS-CoV-2/FLU/RSV testing.  Fact Sheet for Patients: EntrepreneurPulse.com.au  Fact Sheet for Healthcare Providers: IncredibleEmployment.be  This test is not yet approved or cleared by the [place] FDA and has been authorized for detection and/or diagnosis of SARS-CoV-2 by FDA under an Emergency Use Authorization (EUA). This EUA will remain in effect (meaning this test can be used) for the duration of the COVID-19 declaration under Section 564(b)(1) of the Act, 21 U.S.C. section 360bbb-3(b)(1), unless the authorization is terminated or revoked.  Performed at [facility], [address] [place], [place] [address]   Culture, blood (routine x 2)     Status: None (Preliminary result)   Collection Time:  06/09/21  7:28 AM   Specimen: BLOOD  Result Value Ref Range Status   Specimen Description BLOOD RIGHT UPPER ARM  Final   Special Requests   Final    BOTTLES DRAWN AEROBIC AND ANAEROBIC Blood Culture adequate volume   Culture   Final    NO GROWTH 2 DAYS Performed at [facility], [address] [place], [place] [address]    Report Status PENDING  Incomplete  Culture, blood (routine x 2)     Status: None (Preliminary result)   Collection Time: 06/09/21  8:09 AM   Specimen: BLOOD  Result Value Ref Range Status   Specimen Description BLOOD RIGHT HAND  Final   Special Requests   Final    BOTTLES DRAWN AEROBIC AND ANAEROBIC Blood Culture adequate volume   Culture   Final    NO GROWTH 2 DAYS Performed at [facility], [address], [place], [place] [address]    Report Status PENDING  Incomplete  Expectorated Sputum Assessment w Gram Stain, Rflx to Resp Cult     Status: None   Collection Time: 06/09/21  2:28 PM   Specimen: Expectorated Sputum  Result Value Ref Range Status   Specimen Description EXPECTORATED SPUTUM  Final   Special Requests NONE  Final   Sputum evaluation   Final    THIS SPECIMEN IS ACCEPTABLE FOR SPUTUM CULTURE Performed at [facility], [address], [place], [place] [address]    Report Status 06/09/2021 FINAL  Final  Culture, Respiratory w Gram Stain     Status: None (Preliminary result)   Collection Time: 06/09/21  2:28 PM  Result Value Ref Range Status   Specimen Description   Final    EXPECTORATED SPUTUM Performed at [facility], [address] [place], [place] [address]    Special Requests   Final    NONE Reflexed from [id number] Performed at [place], [place] [address]    Gram Stain   Final    FEW SQUAMOUS EPITHELIAL CELLS PRESENT FEW WBC PRESENT,BOTH PMN AND MONONUCLEAR RARE GRAM POSITIVE COCCI IN PAIRS    Culture   Final    TOO YOUNG TO READ Performed at [facility], [address], [place], [place] [address]    Report Status PENDING  Incomplete          Radiology Studies: CT Head Wo Contrast  Result Date: 06/09/2021 CLINICAL DATA:  Found on floor, unwitnessed fall EXAM: CT HEAD WITHOUT CONTRAST CT CERVICAL SPINE WITHOUT CONTRAST TECHNIQUE: Multidetector CT imaging of the head and cervical spine was performed following the standard protocol without intravenous contrast. Multiplanar CT image reconstructions of the cervical spine were also generated. COMPARISON:  02/06/2021 FINDINGS: CT HEAD FINDINGS Brain: No evidence of acute infarction, hemorrhage, hydrocephalus, extra-axial collection or mass lesion/mass effect. Unchanged encephalomalacia of the anterior right MCA territory (series 2, image 11). Underlying periventricular and deep white matter hypodensity. Vascular: No hyperdense vessel or unexpected calcification. Skull: Normal. Negative for fracture or focal lesion. Sinuses/Orbits: No acute finding. Other: None. CT CERVICAL SPINE FINDINGS Examination of the cervical spine is significantly limited by patient motion artifact, including a repeat acquisition. Alignment: Degenerative straightening of the normal cervical lordosis. Skull base and vertebrae: No acute fracture. No primary bone lesion or focal pathologic process. Soft tissues and spinal canal: No prevertebral fluid or swelling. No visible canal hematoma. Disc levels: Focally moderate disc space height loss and osteophytosis of C6-C7. Disc spaces are otherwise intact. Upper chest: Negative. Other: None. IMPRESSION: 1. No acute intracranial pathology. 2. Unchanged encephalomalacia of the anterior right MCA territory in keeping with prior infarction. 3. Small-vessel white matter disease. 4. Examination of the cervical spine is significantly limited by patient motion artifact, including a repeat acquisition. Within this limitation, no obvious displaced fracture or static subluxation of the cervical  spine. Electronically Signed   By: Eddie Candle M.D.   On: 06/09/2021 09:55   CT CHEST WO CONTRAST  Result Date: 06/09/2021 CLINICAL DATA:  Evaluate for pneumonia, pleural effusion, or abscess. EXAM: CT CHEST WITHOUT CONTRAST TECHNIQUE: Multidetector CT imaging of the chest was performed following the standard protocol without IV contrast. COMPARISON:  05/20/2021 FINDINGS: Cardiovascular: Aortic atherosclerosis. Coronary artery calcifications. Cardiac enlargement. No pericardial effusion. Mediastinum/Nodes: Normal appearance of the thyroid gland. The trachea appears patent and is midline. Normal appearance of the esophagus. Lack of IV contrast material and diffuse edema throughout the mediastinal fat diminishes assessment for adenopathy. Within this limitation previously noted prominent mediastinal lymph nodes appear similar to previous exam. This includes a 1.5 cm low right paratracheal  lymph node, image 62/2. No signs of axillary or supraclavicular adenopathy. Hilar lymph nodes are suboptimally evaluated due to lack of IV contrast material. Lungs/Pleura: Interval increase in volume of bilateral pleural effusions compared with the previous exam. Small chronic appearing fibrothorax with calcifications noted overlying the posterior right base, image 126/2. There is diffuse interlobular septal thickening with ground-glass attenuation throughout both lungs compatible with moderate pulmonary interstitial and airspace edema. New multifocal airspace opacities are noted within the anterior right upper lobe, image 58/3 and posterior left upper lobe, image 74/3. Similar appearance of subsegmental atelectasis within both lower lung zones, right greater than left. Upper Abdomen: No acute abnormality identified Musculoskeletal: There is diffuse anasarca throughout the soft tissues of the chest. Mild diffuse increased skeletal sclerosis may reflect changes due to renal osteodystrophy. No acute or suspicious bone lesion  identified. Chronic right lateral rib fracture appears unchanged, image 32/6. IMPRESSION: 1. Bilateral pleural effusions and moderate pulmonary interstitial and airspace edema compatible with moderate to severe CHF. Compared with the previous exam the pleural effusions have increased in volume. 2. New multifocal airspace opacities are noted within the anterior right upper lobe and posterior left upper lobe. This may represent asymmetric areas of alveolar edema versus pneumonia. 3. Diffuse body wall edema as well as edema throughout the mediastinal fat compatible with anasarca. 4. Aortic atherosclerosis and coronary artery calcifications. Aortic Atherosclerosis (ICD10-I70.0). Electronically Signed   By: Kerby Moors M.D.   On: 06/09/2021 12:45   CT Cervical Spine Wo Contrast  Result Date: 06/09/2021 CLINICAL DATA:  Found on floor, unwitnessed fall EXAM: CT HEAD WITHOUT CONTRAST CT CERVICAL SPINE WITHOUT CONTRAST TECHNIQUE: Multidetector CT imaging of the head and cervical spine was performed following the standard protocol without intravenous contrast. Multiplanar CT image reconstructions of the cervical spine were also generated. COMPARISON:  02/06/2021 FINDINGS: CT HEAD FINDINGS Brain: No evidence of acute infarction, hemorrhage, hydrocephalus, extra-axial collection or mass lesion/mass effect. Unchanged encephalomalacia of the anterior right MCA territory (series 2, image 11). Underlying periventricular and deep white matter hypodensity. Vascular: No hyperdense vessel or unexpected calcification. Skull: Normal. Negative for fracture or focal lesion. Sinuses/Orbits: No acute finding. Other: None. CT CERVICAL SPINE FINDINGS Examination of the cervical spine is significantly limited by patient motion artifact, including a repeat acquisition. Alignment: Degenerative straightening of the normal cervical lordosis. Skull base and vertebrae: No acute fracture. No primary bone lesion or focal pathologic process. Soft  tissues and spinal canal: No prevertebral fluid or swelling. No visible canal hematoma. Disc levels: Focally moderate disc space height loss and osteophytosis of C6-C7. Disc spaces are otherwise intact. Upper chest: Negative. Other: None. IMPRESSION: 1. No acute intracranial pathology. 2. Unchanged encephalomalacia of the anterior right MCA territory in keeping with prior infarction. 3. Small-vessel white matter disease. 4. Examination of the cervical spine is significantly limited by patient motion artifact, including a repeat acquisition. Within this limitation, no obvious displaced fracture or static subluxation of the cervical spine. Electronically Signed   By: Eddie Candle M.D.   On: 06/09/2021 09:55        Scheduled Meds:  aspirin  81 mg Oral Daily   atorvastatin  80 mg Oral Daily   carvedilol  25 mg Oral BID WC   Chlorhexidine Gluconate Cloth  6 each Topical Q0600   feeding supplement  237 mL Oral TID BM   feeding supplement (NEPRO CARB STEADY)  237 mL Oral BID BM   gabapentin  400 mg Oral QHS   heparin  5,000 Units Subcutaneous Q8H   hydrALAZINE  100 mg Oral Q8H   levETIRAcetam  250 mg Oral Q M,W,F-HD   levETIRAcetam  500 mg Oral Daily   lisinopril  40 mg Oral Daily   multivitamin  1 tablet Oral QHS   sevelamer carbonate  800 mg Oral TID WC   spironolactone  25 mg Oral Daily   Continuous Infusions:  azithromycin 500 mg (06/10/21 1435)   cefTRIAXone (ROCEPHIN)  IV 2 g (06/10/21 1336)     LOS: 2 days   Time spent: 89mn  Rubbie Goostree C Denzel Etienne, DO [facility]  If 7PM-7AM, please contact night-coverage [email or web address]  06/11/2021, 7:50 AM

## 2021-06-11 NOTE — Progress Notes (Signed)
[facility]  ROUNDING NOTE   Subjective:   [name] was admitted to [facility] on 06/09/2021 for Hypoglycemia [E16.2] HCAP (healthcare-associated pneumonia) [J18.9] Hypothermia, initial encounter [T68.XXXA] Acute metabolic encephalopathy [K35.46] Sepsis with acute hypoxic respiratory failure without septic shock, due to unspecified organism ([place]) [A41.9, R65.20, J96.01]  Patient found to have hypoglycemia and pneumonia. Last hemodialysis treatment was 06/08/21.    Patient seen sitting at side of bed.  Eating breakfast Denies nausea and vomiting Denies shortness of breath, currently on 3L [place]  Dialysis yesterday, tolerated well UF 1L removed    Objective:  Vital signs in last 24 hours:  Temp:  [97.7 F (36.5 C)-98.4 F (36.9 C)] 98.2 F (36.8 C) (08/25 1148) Pulse Rate:  [75-88] 76 (08/25 1148) Resp:  [17-20] 20 (08/25 0425) BP: (135-158)/(47-84) 150/84 (08/25 1148) SpO2:  [94 %-100 %] 100 % (08/25 1148)  Weight change:  Filed Weights   06/09/21 0654 06/10/21 0638  Weight: 60.1 kg 61.1 kg    Intake/Output: I/O last 3 completed shifts: In: 883.4 [P.O.:120; I.V.:763.4] Out: 1000 [Other:1000]   Intake/Output this shift:  Total I/O In: 120 [P.O.:120] Out: -   Physical Exam: General: NAD, sitting up in bed  Head: Normocephalic, atraumatic. Moist oral mucosal membranes  Eyes: Anicteric  Lungs:  Clear to auscultation, normal effort  Heart: Regular rate and rhythm  Abdomen:  Soft, nontender  Extremities:  Bilateral BKA  Neurologic: Alert and oriented x 4. Slow to respond.   Skin: No lesions  Access: Left AVF    Basic Metabolic Panel: Recent Labs  Lab 06/09/21 0626 06/10/21 0809 06/11/21 0538  NA 136 134* 135  K 3.6 4.1 4.2  CL 95* 94* 93*  CO2 31 26 29   GLUCOSE 106* 171* 143*  BUN 27* 41* 32*  CREATININE 3.62* 4.72* 3.43*  CALCIUM 9.0 9.0 8.6*  MG 1.8  --   --      Liver Function Tests: Recent Labs  Lab 06/09/21 0626  AST 22   ALT 13  ALKPHOS 74  BILITOT 0.4  PROT 7.2  ALBUMIN 2.8*    No results for input(s): LIPASE, AMYLASE in the last 168 hours. No results for input(s): AMMONIA in the last 168 hours.  CBC: Recent Labs  Lab 06/09/21 0626 06/10/21 0809 06/11/21 0538  WBC 11.6* 9.3 7.9  NEUTROABS 10.6*  --   --   HGB 8.5* 9.2* 7.9*  HCT 28.4* 30.7* 25.7*  MCV 90.7 94.2 90.5  PLT 232 173 156     Cardiac Enzymes: No results for input(s): CKTOTAL, CKMB, CKMBINDEX, TROPONINI in the last 168 hours.  BNP: Invalid input(s): POCBNP  CBG: Recent Labs  Lab 06/10/21 1948 06/10/21 2334 06/11/21 0512 06/11/21 0832 06/11/21 1150  GLUCAP 284* 228* 161* 141* 166*     Microbiology: Results for orders placed or performed during the hospital encounter of 06/09/21  Resp Panel by RT-PCR (Flu A&B, Covid) Nasopharyngeal Swab     Status: None   Collection Time: 06/09/21  7:24 AM   Specimen: Nasopharyngeal Swab; Nasopharyngeal(NP) swabs in vial transport medium  Result Value Ref Range Status   SARS Coronavirus 2 by RT PCR NEGATIVE NEGATIVE Final    Comment: (NOTE) SARS-CoV-2 target nucleic acids are NOT DETECTED.  The SARS-CoV-2 RNA is generally detectable in upper respiratory specimens during the acute phase of infection. The lowest concentration of SARS-CoV-2 viral copies this assay can detect is 138 copies/mL. A negative result does not preclude SARS-Cov-2 infection and should  not be used as the sole basis for treatment or other patient management decisions. A negative result may occur with  improper specimen collection/handling, submission of specimen other than nasopharyngeal swab, presence of viral mutation(s) within the areas targeted by this assay, and inadequate number of viral copies(<138 copies/mL). A negative result must be combined with clinical observations, patient history, and epidemiological information. The expected result is Negative.  Fact Sheet for Patients:   EntrepreneurPulse.com.au  Fact Sheet for Healthcare Providers:  IncredibleEmployment.be  This test is no t yet approved or cleared by the [place] FDA and  has been authorized for detection and/or diagnosis of SARS-CoV-2 by FDA under an Emergency Use Authorization (EUA). This EUA will remain  in effect (meaning this test can be used) for the duration of the COVID-19 declaration under Section 564(b)(1) of the Act, 21 U.S.C.section 360bbb-3(b)(1), unless the authorization is terminated  or revoked sooner.       Influenza A by PCR NEGATIVE NEGATIVE Final   Influenza B by PCR NEGATIVE NEGATIVE Final    Comment: (NOTE) The Xpert Xpress SARS-CoV-2/FLU/RSV plus assay is intended as an aid in the diagnosis of influenza from Nasopharyngeal swab specimens and should not be used as a sole basis for treatment. Nasal washings and aspirates are unacceptable for Xpert Xpress SARS-CoV-2/FLU/RSV testing.  Fact Sheet for Patients: EntrepreneurPulse.com.au  Fact Sheet for Healthcare Providers: IncredibleEmployment.be  This test is not yet approved or cleared by the [place] FDA and has been authorized for detection and/or diagnosis of SARS-CoV-2 by FDA under an Emergency Use Authorization (EUA). This EUA will remain in effect (meaning this test can be used) for the duration of the COVID-19 declaration under Section 564(b)(1) of the Act, 21 U.S.C. section 360bbb-3(b)(1), unless the authorization is terminated or revoked.  Performed at [facility], [address] [place], [place] [address]   Culture, blood (routine x 2)     Status: None (Preliminary result)   Collection Time: 06/09/21  7:28 AM   Specimen: BLOOD  Result Value Ref Range Status   Specimen Description BLOOD RIGHT UPPER ARM  Final   Special Requests   Final    BOTTLES DRAWN AEROBIC AND ANAEROBIC Blood Culture adequate volume   Culture    Final    NO GROWTH 2 DAYS Performed at [facility], [address], [place], [place] [address]    Report Status PENDING  Incomplete  Culture, blood (routine x 2)     Status: None (Preliminary result)   Collection Time: 06/09/21  8:09 AM   Specimen: BLOOD  Result Value Ref Range Status   Specimen Description BLOOD RIGHT HAND  Final   Special Requests   Final    BOTTLES DRAWN AEROBIC AND ANAEROBIC Blood Culture adequate volume   Culture   Final    NO GROWTH 2 DAYS Performed at [facility], [address], [place], [place] [address]    Report Status PENDING  Incomplete  Expectorated Sputum Assessment w Gram Stain, Rflx to Resp Cult     Status: None   Collection Time: 06/09/21  2:28 PM   Specimen: Expectorated Sputum  Result Value Ref Range Status   Specimen Description EXPECTORATED SPUTUM  Final   Special Requests NONE  Final   Sputum evaluation   Final    THIS SPECIMEN IS ACCEPTABLE FOR SPUTUM CULTURE Performed at [facility], [address], [place],  [address]    Report Status 06/09/2021 FINAL  Final  Culture, Respiratory  w Gram Stain     Status: None (Preliminary result)   Collection Time: 06/09/21  2:28 PM  Result Value Ref Range Status   Specimen Description   Final    EXPECTORATED SPUTUM Performed at [facility], [address] [place], [place] [address]    Special Requests   Final    NONE Reflexed from [id number] Performed at [facility], [address] [place], [place] [address]    Gram Stain   Final    FEW SQUAMOUS EPITHELIAL CELLS PRESENT FEW WBC PRESENT,BOTH PMN AND MONONUCLEAR RARE GRAM POSITIVE COCCI IN PAIRS    Culture   Final    FEW Normal respiratory flora-no Staph aureus or Pseudomonas seen Performed at [facility], [address], [place], [place] [address]    Report Status PENDING  Incomplete    Coagulation Studies: No results for input(s): LABPROT, INR in the last 72  hours.  Urinalysis: Recent Labs    06/09/21 0728  COLORURINE YELLOW*  LABSPEC 1.012  PHURINE 7.0  GLUCOSEU 50*  HGBUR NEGATIVE  BILIRUBINUR NEGATIVE  KETONESUR NEGATIVE  PROTEINUR >=300*  NITRITE NEGATIVE  LEUKOCYTESUR LARGE*       Imaging: No results found.   Medications:    azithromycin 500 mg (06/11/21 0917)   cefTRIAXone (ROCEPHIN)  IV 2 g (06/10/21 1336)    aspirin  81 mg Oral Daily   atorvastatin  80 mg Oral Daily   carvedilol  25 mg Oral BID WC   Chlorhexidine Gluconate Cloth  6 each Topical Q0600   dextrose       feeding supplement  237 mL Oral TID BM   feeding supplement (NEPRO CARB STEADY)  237 mL Oral BID BM   gabapentin  400 mg Oral QHS   heparin  5,000 Units Subcutaneous Q8H   hydrALAZINE  100 mg Oral Q8H   levETIRAcetam  250 mg Oral Q M,W,F-HD   levETIRAcetam  500 mg Oral Daily   lisinopril  40 mg Oral Daily   multivitamin  1 tablet Oral QHS   sevelamer carbonate  800 mg Oral TID WC   spironolactone  25 mg Oral Daily     Assessment/ Plan:  [name] is a 60 y.o. black male with end stage renal disease on hemodialysis, diabetes mellitus type II, hypertension, CVA, peripheral vascular disease status post bilateral BKA, who is admitted to [facility] on 06/09/2021 for Hypoglycemia [E16.2] HCAP (healthcare-associated pneumonia) [J18.9] Hypothermia, initial encounter [T68.XXXA] Acute metabolic encephalopathy [I77.82] Sepsis with acute hypoxic respiratory failure without septic shock, due to unspecified organism ([place]) [A41.9, R65.20, J96.01]  [facility] [address] MWF L AVF   End Stage Renal Disease: MWF schedule. Received dialysis yesterday, UF 1L achieved. Next treatment scheduled for Friday.   Hypertension: 150/84 with history of chronic systolic and diastolic congestive heart failure. home regimen of carvedilol, hydralazine, isosorbide mononitrate, lisinopril, and spironolactone   Anemia of chronic kidney disease: Hemoglobin 7.9.   - Will order EPO with HD treatments.   Secondary Hyperparathyroidism: continue sevelamer with meals.    LOS: 2   8/25/202212:37 PM

## 2021-06-12 ENCOUNTER — Ambulatory Visit (INDEPENDENT_AMBULATORY_CARE_PROVIDER_SITE_OTHER): Payer: Medicare Other

## 2021-06-12 ENCOUNTER — Encounter: Payer: Self-pay | Admitting: Internal Medicine

## 2021-06-12 DIAGNOSIS — N186 End stage renal disease: Secondary | ICD-10-CM

## 2021-06-12 DIAGNOSIS — E1129 Type 2 diabetes mellitus with other diabetic kidney complication: Secondary | ICD-10-CM

## 2021-06-12 DIAGNOSIS — G9341 Metabolic encephalopathy: Secondary | ICD-10-CM | POA: Diagnosis not present

## 2021-06-12 LAB — CBC
HCT: 26 % — ABNORMAL LOW (ref 39.0–52.0)
Hemoglobin: 7.8 g/dL — ABNORMAL LOW (ref 13.0–17.0)
MCH: 27.8 pg (ref 26.0–34.0)
MCHC: 30 g/dL (ref 30.0–36.0)
MCV: 92.5 fL (ref 80.0–100.0)
Platelets: 145 10*3/uL — ABNORMAL LOW (ref 150–400)
RBC: 2.81 MIL/uL — ABNORMAL LOW (ref 4.22–5.81)
RDW: 19.3 % — ABNORMAL HIGH (ref 11.5–15.5)
WBC: 6.5 10*3/uL (ref 4.0–10.5)
nRBC: 0 % (ref 0.0–0.2)

## 2021-06-12 LAB — GLUCOSE, CAPILLARY
Glucose-Capillary: 122 mg/dL — ABNORMAL HIGH (ref 70–99)
Glucose-Capillary: 129 mg/dL — ABNORMAL HIGH (ref 70–99)
Glucose-Capillary: 130 mg/dL — ABNORMAL HIGH (ref 70–99)
Glucose-Capillary: 133 mg/dL — ABNORMAL HIGH (ref 70–99)
Glucose-Capillary: 169 mg/dL — ABNORMAL HIGH (ref 70–99)
Glucose-Capillary: 200 mg/dL — ABNORMAL HIGH (ref 70–99)

## 2021-06-12 LAB — BASIC METABOLIC PANEL
Anion gap: 11 (ref 5–15)
BUN: 42 mg/dL — ABNORMAL HIGH (ref 6–20)
CO2: 28 mmol/L (ref 22–32)
Calcium: 8.6 mg/dL — ABNORMAL LOW (ref 8.9–10.3)
Chloride: 97 mmol/L — ABNORMAL LOW (ref 98–111)
Creatinine, Ser: 4.49 mg/dL — ABNORMAL HIGH (ref 0.61–1.24)
GFR, Estimated: 14 mL/min — ABNORMAL LOW (ref >60)
Glucose, Bld: 125 mg/dL — ABNORMAL HIGH (ref 70–99)
Potassium: 4.5 mmol/L (ref 3.5–5.1)
Sodium: 136 mmol/L (ref 135–145)

## 2021-06-12 LAB — CULTURE, RESPIRATORY W GRAM STAIN: Culture: NORMAL

## 2021-06-12 MED ORDER — ACETAMINOPHEN 325 MG PO TABS
ORAL_TABLET | ORAL | Status: AC
  Filled 2021-06-12: qty 2

## 2021-06-12 NOTE — Discharge Summary (Addendum)
Physician Discharge Summary  [name] EYC:[id number] DOB: 03/03/1961 DOA: 06/09/2021  PCP: [name], MD  Admit date: 06/09/2021 Discharge date: 06/13/2021  Admitted From: SNF Disposition: Same  Recommendations for Outpatient Follow-up:  Follow up with PCP in 1-2 weeks Please obtain BMP/CBC in one week Please follow up with dialysis as scheduled  Discharge Condition: Stable CODE STATUS: Full Diet recommendation: Renal diabetic diet  Brief/Interim Summary: [name] is a 60 y.o. male with medical history significant for DM of end-stage renal disease on dialysis, M/W/F, HTN, seizure disorder, CVA, vascular dementia, peripheral arterial disease status post bilateral BKA, hydradenitis suppurativa and cutaneous Crohn's on [name] and as needed Augmentin for flareups, followed by [facility] dermatology, status post recent hospitalization for acute respiratory failure who was discharged to skilled nursing facility for subacute rehab and was sent to the emergency room for evaluation of mental status changes. Found to have profound hypoglycemia at 29 with concurrent hypoxia, concern for re-initiation of insulin at SNF despite being off insulin for years per family discussion.   Assessment & Plan:   Acute metabolic encephalopathy on chronic vascular dementia Symptomatic hypoglycemia  Concurrent acute hypoxia (as below) Most likely related to hypoglycemia - possible re-initiation of insulin which patient is not currently on at home per fmaily Initially requiring dextrose IV fluids, now tolerating p.o. more appropriately IV fluids have been discontinued Baseline vascular dementia per wife, worsening over the past 6 months to year being followed up with neurology in the outpatient setting -currently back to baseline   Uncontrolled diabetes mellitus with complications of end-stage renal disease on hemodialysis as well as hypoglycemia Patient has a history of diabetes mellitus and per his  wife has been off insulin for years due to intolerace/hypoglycemia Given prolonged hypoglycemia concern the patient received long-acting glargine/similar insulin outpatient Dextrose infusion discontinued given increased p.o. intake more appropriate, glucose currently well controlled with appropriate p.o. intake Dialysis days are M/W/F   Acute hypoxic respiratory failure secondary to pneumonia, resolving Cannot rule out aspiration vs CAP Noted on chest x-ray Patient recently discharged from the hospital and completed antibiotic therapy for CAP Cover prophylactically with azithromycin and ceftriaxone -completed antibiotic course CT shows bilateral airspace disease and mild effusions Speech therapy consult for swallow function evaluation -no acute issues   Chronic anemia of chronic disease - ESRD H/H Stable Continue iron supplements   History of CVA (cerebrovascular accident) Concurrent vascular dementia, chronic Post stroke seizure disorder and cognitive deficit; concurrent vascular dementia per wife CT head with no acute findings and patient with no focal deficit Continue antiplatelets and statins  Continue Keppra for seizure prophylaxis   Peripheral arterial disease S/P bilateral BKA (below knee amputation) ([place])  Increase nursing assistance for transfers.  Trapeze above bed if  desired Continue statins, aspirin and metoprolol   Chronic combined systolic and diastolic CHF (congestive heart failure) ([place]) Not acutely exacerbated on clincal exam Last echo from August 2022 with EF 35 to 40% and grade 2 diastolic dysfunction Continue lisinopril, spironolactone and carvedilol Volume management with HD   Essential hypertension Continue nitrates, lisinopril, Coreg and hydralazine  Discharge Diagnoses:  Principal Problem:   Acute metabolic encephalopathy Active Problems:   Hidradenitis suppurativa   Type 2 diabetes mellitus with hypoglycemia without coma (HCC)   End stage renal  failure on dialysis [facility])   History of CVA (cerebrovascular accident)   Seizure disorder ([place])   S/P bilateral BKA (below knee amputation) ([place])   Cerebrovascular disease   Chronic systolic  CHF (congestive heart failure) [facility])    Discharge Instructions  Discharge Instructions     Call MD for:  extreme fatigue   Complete by: As directed    Call MD for:  severe uncontrolled pain   Complete by: As directed    Call MD for:  temperature >100.4   Complete by: As directed    Diet - low sodium heart healthy   Complete by: As directed    Discharge wound care:   Complete by: As directed    Keep clean/dry - bandage change as needed   Increase activity slowly   Complete by: As directed       Allergies as of 06/13/2021       Reactions   Methotrexate Other (See Comments)   Blood count drops   Vancomycin Shortness Of Breath   Eyes watering, SOB, wheezing   Cefepime Other (See Comments)   Confusion    Tape         Medication List     STOP taking these medications    furosemide 80 MG tablet Commonly known as: LASIX   isosorbide dinitrate 20 MG tablet Commonly known as: ISORDIL       TAKE these medications    acetaminophen 500 MG tablet Commonly known as: TYLENOL Take 1,000 mg by mouth daily as needed for moderate pain or headache.   Alcohol Swabs Pads Use as directed to check blood sugar three times daily for insulin dependent type 2 diabetes.   aspirin 81 MG chewable tablet Chew 81 mg by mouth daily.   atorvastatin 80 MG tablet Commonly known as: LIPITOR TAKE 1 TABLET(80 MG) BY MOUTH DAILY   carvedilol 25 MG tablet Commonly known as: COREG Take 1 tablet (25 mg total) by mouth 2 (two) times daily.   feeding supplement Liqd Take 237 mLs by mouth 3 (three) times daily between meals.   gabapentin 400 MG capsule Commonly known as: NEURONTIN Take 400 mg by mouth at bedtime. What changed: Another medication with the same name was removed. Continue taking  this medication, and follow the directions you see here.   [name] Pen 40 MG/0.4ML Pnkt Generic drug: Adalimumab Inject 40 mg into the muscle once a week.   hydrALAZINE 100 MG tablet Commonly known as: APRESOLINE Take 1 tablet (100 mg total) by mouth every 8 (eight) hours.   levETIRAcetam 500 MG tablet Commonly known as: KEPPRA Take 1 tablet (500 mg total) by mouth daily.   levETIRAcetam 250 MG tablet Commonly known as: KEPPRA Take 250 mg by mouth daily as needed.   lisinopril 40 MG tablet Commonly known as: ZESTRIL Take 40 mg by mouth daily.   multivitamin Tabs tablet Take 1 tablet by mouth at bedtime.   ONE TOUCH ULTRA 2 w/Device Kit Use as directed to check blood sugar three times daily. E11.9   sevelamer carbonate 800 MG tablet Commonly known as: RENVELA TAKE 1 TABLET(800 MG) BY MOUTH THREE TIMES DAILY   spironolactone 25 MG tablet Commonly known as: ALDACTONE Take 1 tablet by mouth at bedtime.               Discharge Care Instructions  (From admission, onward)           Start     Ordered   06/13/21 0000  Discharge wound care:       Comments: Keep clean/dry - bandage change as needed   06/13/21 0805            Allergies  Allergen Reactions   Methotrexate Other (See Comments)    Blood count drops   Vancomycin Shortness Of Breath    Eyes watering, SOB, wheezing   Cefepime Other (See Comments)    Confusion    Tape     Consultations: Nephrology  Procedures/Studies: DG Chest 2 View  Result Date: 05/14/2021 CLINICAL DATA:  Shortness of breath. EXAM: CHEST - 2 VIEW COMPARISON:  May 12, 2021. FINDINGS: Stable cardiomegaly is noted. Bilateral perihilar and basilar densities are noted concerning for edema or atelectasis. Stable loculated right pleural effusion is noted. Bony thorax is unremarkable. IMPRESSION: Stable cardiomegaly. Stable bilateral perihilar and basilar edema or atelectasis. Stable loculated right pleural effusion.  Electronically Signed   By: Marijo Conception M.D.   On: 05/14/2021 15:36   CT Head Wo Contrast  Result Date: 06/09/2021 CLINICAL DATA:  Found on floor, unwitnessed fall EXAM: CT HEAD WITHOUT CONTRAST CT CERVICAL SPINE WITHOUT CONTRAST TECHNIQUE: Multidetector CT imaging of the head and cervical spine was performed following the standard protocol without intravenous contrast. Multiplanar CT image reconstructions of the cervical spine were also generated. COMPARISON:  02/06/2021 FINDINGS: CT HEAD FINDINGS Brain: No evidence of acute infarction, hemorrhage, hydrocephalus, extra-axial collection or mass lesion/mass effect. Unchanged encephalomalacia of the anterior right MCA territory (series 2, image 11). Underlying periventricular and deep white matter hypodensity. Vascular: No hyperdense vessel or unexpected calcification. Skull: Normal. Negative for fracture or focal lesion. Sinuses/Orbits: No acute finding. Other: None. CT CERVICAL SPINE FINDINGS Examination of the cervical spine is significantly limited by patient motion artifact, including a repeat acquisition. Alignment: Degenerative straightening of the normal cervical lordosis. Skull base and vertebrae: No acute fracture. No primary bone lesion or focal pathologic process. Soft tissues and spinal canal: No prevertebral fluid or swelling. No visible canal hematoma. Disc levels: Focally moderate disc space height loss and osteophytosis of C6-C7. Disc spaces are otherwise intact. Upper chest: Negative. Other: None. IMPRESSION: 1. No acute intracranial pathology. 2. Unchanged encephalomalacia of the anterior right MCA territory in keeping with prior infarction. 3. Small-vessel white matter disease. 4. Examination of the cervical spine is significantly limited by patient motion artifact, including a repeat acquisition. Within this limitation, no obvious displaced fracture or static subluxation of the cervical spine. Electronically Signed   By: Eddie Candle M.D.    On: 06/09/2021 09:55   CT CHEST WO CONTRAST  Result Date: 06/09/2021 CLINICAL DATA:  Evaluate for pneumonia, pleural effusion, or abscess. EXAM: CT CHEST WITHOUT CONTRAST TECHNIQUE: Multidetector CT imaging of the chest was performed following the standard protocol without IV contrast. COMPARISON:  05/20/2021 FINDINGS: Cardiovascular: Aortic atherosclerosis. Coronary artery calcifications. Cardiac enlargement. No pericardial effusion. Mediastinum/Nodes: Normal appearance of the thyroid gland. The trachea appears patent and is midline. Normal appearance of the esophagus. Lack of IV contrast material and diffuse edema throughout the mediastinal fat diminishes assessment for adenopathy. Within this limitation previously noted prominent mediastinal lymph nodes appear similar to previous exam. This includes a 1.5 cm low right paratracheal lymph node, image 62/2. No signs of axillary or supraclavicular adenopathy. Hilar lymph nodes are suboptimally evaluated due to lack of IV contrast material. Lungs/Pleura: Interval increase in volume of bilateral pleural effusions compared with the previous exam. Small chronic appearing fibrothorax with calcifications noted overlying the posterior right base, image 126/2. There is diffuse interlobular septal thickening with ground-glass attenuation throughout both lungs compatible with moderate pulmonary interstitial and airspace edema. New multifocal airspace opacities are noted within the anterior right upper lobe, image  58/3 and posterior left upper lobe, image 74/3. Similar appearance of subsegmental atelectasis within both lower lung zones, right greater than left. Upper Abdomen: No acute abnormality identified Musculoskeletal: There is diffuse anasarca throughout the soft tissues of the chest. Mild diffuse increased skeletal sclerosis may reflect changes due to renal osteodystrophy. No acute or suspicious bone lesion identified. Chronic right lateral rib fracture appears  unchanged, image 32/6. IMPRESSION: 1. Bilateral pleural effusions and moderate pulmonary interstitial and airspace edema compatible with moderate to severe CHF. Compared with the previous exam the pleural effusions have increased in volume. 2. New multifocal airspace opacities are noted within the anterior right upper lobe and posterior left upper lobe. This may represent asymmetric areas of alveolar edema versus pneumonia. 3. Diffuse body wall edema as well as edema throughout the mediastinal fat compatible with anasarca. 4. Aortic atherosclerosis and coronary artery calcifications. Aortic Atherosclerosis (ICD10-I70.0). Electronically Signed   By: Kerby Moors M.D.   On: 06/09/2021 12:45   CT CHEST WO CONTRAST  Result Date: 05/18/2021 CLINICAL DATA:  59 year old male with history of chest pain or shortness of breath. EXAM: CT CHEST WITHOUT CONTRAST TECHNIQUE: Multidetector CT imaging of the chest was performed following the standard protocol without IV contrast. COMPARISON:  Chest CT 05/13/2021. FINDINGS: Cardiovascular: Heart size is enlarged. There is no significant pericardial fluid, thickening or pericardial calcification. There is aortic atherosclerosis, as well as atherosclerosis of the great vessels of the mediastinum and the coronary arteries, including calcified atherosclerotic plaque in the left main, left anterior descending, left circumflex and right coronary arteries. Calcifications of the aortic valve and mitral annulus. Vascular stent in the left innominate vein. Mediastinum/Nodes: Assessment for lymphadenopathy is exceedingly limited on today's noncontrast CT examination, particularly in light of the patient's thin body habitus. Thickening of the esophagus most notably in the mid esophagus (axial image 41 of series 2), poorly evaluated on today's noncontrast CT examination. No axillary lymphadenopathy. Lungs/Pleura: Right-sided pleural calcification. No left pleural calcification identified.  Small right and moderate left pleural effusions. The majority of the left pleural effusion is sub pulmonic in distribution. There continues to be generalized mild ground-glass attenuation and interlobular septal thickening in the lungs bilaterally suggesting a background of mild interstitial pulmonary edema. Compared to the prior study, the left lower lobe is now opacified with only a small amount of volume loss, indicating predominantly airspace consolidation rather than simple atelectasis. Upper Abdomen: Aortic atherosclerosis. Musculoskeletal: There are no aggressive appearing lytic or blastic lesions noted in the visualized portions of the skeleton. IMPRESSION: 1. There continues to be evidence of congestive heart failure, as above. However, today's study also demonstrates new airspace consolidation in the left lower lobe concerning for pneumonia. 2. Small right and moderate left pleural effusions. Left pleural effusion has increased substantially in size and is predominantly sub pulmonic in location, potentially loculated. 3. Aortic atherosclerosis, in addition to left main and 3 vessel coronary artery disease. Please note that although the presence of coronary artery calcium documents the presence of coronary artery disease, the severity of this disease and any potential stenosis cannot be assessed on this non-gated CT examination. Assessment for potential risk factor modification, dietary therapy or pharmacologic therapy may be warranted, if clinically indicated. 4. There are calcifications of the aortic valve and mitral annulus. Echocardiographic correlation for evaluation of potential valvular dysfunction may be warranted if clinically indicated. Aortic Atherosclerosis (ICD10-I70.0). Electronically Signed   By: Vinnie Langton M.D.   On: 05/18/2021 05:33   CT Angio Chest  Pulmonary Embolism (PE) W or WO Contrast  Result Date: 05/20/2021 CLINICAL DATA:  Shortness of breath.  PE suspected, high prob EXAM:  CT ANGIOGRAPHY CHEST WITH CONTRAST TECHNIQUE: Multidetector CT imaging of the chest was performed using the standard protocol during bolus administration of intravenous contrast. Multiplanar CT image reconstructions and MIPs were obtained to evaluate the vascular anatomy. CONTRAST:  75m OMNIPAQUE IOHEXOL 350 MG/ML SOLN COMPARISON:  05/18/2021 FINDINGS: Cardiovascular: No filling defects in the pulmonary arteries to suggest pulmonary emboli. Extensive coronary artery calcifications and scattered aortic calcifications. No aneurysm. Heart is mildly enlarged. Mediastinum/Nodes: Mildly prominent mediastinal lymph nodes, similar to prior study. Esophageal wall thickening again noted in the mid esophagus. Trachea and thyroid unremarkable. Lungs/Pleura: Small bilateral pleural effusions, similar prior study. Bilateral lower lobe airspace opacities, with some improvement in the left lower lobe since prior study. Pleural calcifications in the right lower hemithorax, stable. Upper Abdomen: Imaging into the upper abdomen demonstrates no acute findings. Musculoskeletal: Chest wall soft tissues are unremarkable. No acute bony abnormality. Review of the MIP images confirms the above findings. IMPRESSION: No evidence of pulmonary embolus. Cardiomegaly, coronary artery disease. Small bilateral effusions. Bilateral lower lobe airspace opacities with some improvement on the left since prior study. Findings could reflect edema or infection. Aortic Atherosclerosis (ICD10-I70.0). Electronically Signed   By: KRolm BaptiseM.D.   On: 05/20/2021 08:37   CT Cervical Spine Wo Contrast  Result Date: 06/09/2021 CLINICAL DATA:  Found on floor, unwitnessed fall EXAM: CT HEAD WITHOUT CONTRAST CT CERVICAL SPINE WITHOUT CONTRAST TECHNIQUE: Multidetector CT imaging of the head and cervical spine was performed following the standard protocol without intravenous contrast. Multiplanar CT image reconstructions of the cervical spine were also  generated. COMPARISON:  02/06/2021 FINDINGS: CT HEAD FINDINGS Brain: No evidence of acute infarction, hemorrhage, hydrocephalus, extra-axial collection or mass lesion/mass effect. Unchanged encephalomalacia of the anterior right MCA territory (series 2, image 11). Underlying periventricular and deep white matter hypodensity. Vascular: No hyperdense vessel or unexpected calcification. Skull: Normal. Negative for fracture or focal lesion. Sinuses/Orbits: No acute finding. Other: None. CT CERVICAL SPINE FINDINGS Examination of the cervical spine is significantly limited by patient motion artifact, including a repeat acquisition. Alignment: Degenerative straightening of the normal cervical lordosis. Skull base and vertebrae: No acute fracture. No primary bone lesion or focal pathologic process. Soft tissues and spinal canal: No prevertebral fluid or swelling. No visible canal hematoma. Disc levels: Focally moderate disc space height loss and osteophytosis of C6-C7. Disc spaces are otherwise intact. Upper chest: Negative. Other: None. IMPRESSION: 1. No acute intracranial pathology. 2. Unchanged encephalomalacia of the anterior right MCA territory in keeping with prior infarction. 3. Small-vessel white matter disease. 4. Examination of the cervical spine is significantly limited by patient motion artifact, including a repeat acquisition. Within this limitation, no obvious displaced fracture or static subluxation of the cervical spine. Electronically Signed   By: AEddie CandleM.D.   On: 06/09/2021 09:55   DG Chest Portable 1 View  Result Date: 06/09/2021 CLINICAL DATA:  Altered mental status. EXAM: PORTABLE CHEST 1 VIEW COMPARISON:  May 18, 2021. FINDINGS: Stable cardiomegaly. Stable bilateral lung opacities are noted concerning for multifocal pneumonia or possibly edema. Right pleural effusion is significantly smaller compared to prior exam. No pneumothorax is noted. Bony thorax is unremarkable. IMPRESSION: Stable  bilateral lung opacities are noted concerning for multifocal pneumonia or possibly edema. Right pleural effusion is significantly smaller. Aortic Atherosclerosis (ICD10-I70.0). Electronically Signed   By: JBobbe MedicoD.  On: 06/09/2021 08:07   DG Chest Port 1 View  Result Date: 05/18/2021 CLINICAL DATA:  Respiratory distress. Recent left-sided thoracentesis. EXAM: PORTABLE CHEST 1 VIEW COMPARISON:  05/18/2021 FINDINGS: Portable semi upright view of the chest was obtained. Grossly stable cardiomediastinal contours. Aortic atherosclerosis. Moderate-sized right-sided pleural effusion with extensive airspace opacities within the right lung. Diffuse interstitial opacities throughout the left lung with slight improved aeration of the left lung base. No pneumothorax. Vascular stents again seen in the left axillary and brachiocephalic regions. IMPRESSION: 1. Slight improved aeration of the left lung base post thoracentesis. No pneumothorax. 2. Otherwise stable chest with multifocal opacities and moderate right-sided pleural effusion. Electronically Signed   By: Davina Poke D.O.   On: 05/18/2021 15:37   DG Chest Port 1 View  Result Date: 05/18/2021 CLINICAL DATA:  Status post left thoracentesis EXAM: PORTABLE CHEST 1 VIEW COMPARISON:  05/18/2021, 1:54 a.m. FINDINGS: Interval reduction in volume of a previously seen large left pleural effusion, now small and layering, with improved aeration of the left lung. Interval increase in volume of right-sided pleural effusion with increased consolidation of the right lung. Cardiomegaly. Left axillary and brachiocephalic stents. IMPRESSION: 1. Interval reduction in volume of a previously seen large left pleural effusion, now small and layering, with improved aeration of the left lung. No pneumothorax. 2. Interval increase in volume of right-sided pleural effusion with increased consolidation of the right lung. 3. Cardiomegaly. Electronically Signed   By: Eddie Candle  M.D.   On: 05/18/2021 12:51   DG Chest Port 1 View  Result Date: 05/18/2021 CLINICAL DATA:  Shortness of breath. EXAM: PORTABLE CHEST 1 VIEW COMPARISON:  Chest x-ray 05/14/2021, CT chest 05/13/2021 FINDINGS: Redemonstration of vascular stents overlying the mediastinum as well as left axilla. Enlarged cardiac silhouette. The heart size and mediastinal contours are grossly unchanged. Aortic calcification. No focal consolidation. No pulmonary edema. Interval increase in size of a moderate volume left pleural effusion. At least trace right pleural effusion. Patchy interstitial and airspace opacities. No pneumothorax. No acute osseous abnormality. IMPRESSION: 1. Interval increase in size of a moderate volume left pleural effusion. 2. At least trace right pleural effusion. 3. Patchy interstitial and airspace opacities. Findings could represent infection versus inflammation or pulmonary edema. Electronically Signed   By: Iven Finn M.D.   On: 05/18/2021 02:19   US THORACENTESIS ASP PLEURAL SPACE W/IMG GUIDE  Result Date: 05/18/2021 INDICATION: Shortness of breath. Left-sided pleural effusion. Request for diagnostic and therapeutic thoracentesis. EXAM: ULTRASOUND GUIDED LEFT THORACENTESIS MEDICATIONS: 1% plain lidocaine, 5 mL COMPLICATIONS: None immediate. PROCEDURE: An ultrasound guided thoracentesis was thoroughly discussed with the patient and questions answered. The benefits, risks, alternatives and complications were also discussed. The patient understands and wishes to proceed with the procedure. Written consent was obtained. Ultrasound was performed to localize and mark an adequate pocket of fluid in the left chest. The area was then prepped and draped in the normal sterile fashion. 1% Lidocaine was used for local anesthesia. Under ultrasound guidance a 6 Fr Safe-T-Centesis catheter was introduced. Thoracentesis was performed. The catheter was removed and a dressing applied. FINDINGS: A total of  approximately 1 L of clear, dark yellow fluid was removed. Samples were sent to the laboratory as requested by the clinical team. IMPRESSION: Successful ultrasound guided left thoracentesis yielding 1 L of pleural fluid. Read by: Ascencion Dike PA-C Electronically Signed   By: Jacqulynn Cadet M.D.   On: 05/18/2021 12:57     Subjective: No acute issues or  events overnight   Discharge Exam: Vitals:   06/13/21 0820 06/13/21 1124  BP: (!) 161/79 (!) 156/60  Pulse: 78 77  Resp: 17 17  Temp: 98.4 F (36.9 C) 98.1 F (36.7 C)  SpO2: 98% 98%   Vitals:   06/13/21 0030 06/13/21 0522 06/13/21 0820 06/13/21 1124  BP: (!) 148/79 (!) 149/90 (!) 161/79 (!) 156/60  Pulse: 77 75 78 77  Resp: _0 Temp: 98.7 F (37.1 C) 98.9 F (37.2 C) 98.4 F (36.9 C) 98.1 F (36.7 C)  TempSrc:  Oral  Oral  SpO2: 100% 98% 98% 98%  Weight:      Height:        General: Pt is alert, awake, not in acute distress Cardiovascular: RRR, S1/S2 +, no rubs, no gallops Respiratory: CTA bilaterally, no wheezing, no rhonchi Abdominal: Soft, NT, ND, bowel sounds + Extremities: no edema, no cyanosis, bilateral BKA   The results of significant diagnostics from this hospitalization (including imaging, microbiology, ancillary and laboratory) are listed below for reference.     Microbiology: Recent Results (from the past 240 hour(s))  Resp Panel by RT-PCR (Flu A&B, Covid) Nasopharyngeal Swab     Status: None   Collection Time: 06/09/21  7:24 AM   Specimen: Nasopharyngeal Swab; Nasopharyngeal(NP) swabs in vial transport medium  Result Value Ref Range Status   SARS Coronavirus 2 by RT PCR NEGATIVE NEGATIVE Final    Comment: (NOTE) SARS-CoV-2 target nucleic acids are NOT DETECTED.  The SARS-CoV-2 RNA is generally detectable in upper respiratory specimens during the acute phase of infection. The lowest concentration of SARS-CoV-2 viral copies this assay can detect is 138 copies/mL. A negative result does not  preclude SARS-Cov-2 infection and should not be used as the sole basis for treatment or other patient management decisions. A negative result may occur with  improper specimen collection/handling, submission of specimen other than nasopharyngeal swab, presence of viral mutation(s) within the areas targeted by this assay, and inadequate number of viral copies(<138 copies/mL). A negative result must be combined with clinical observations, patient history, and epidemiological information. The expected result is Negative.  Fact Sheet for Patients:  EntrepreneurPulse.com.au  Fact Sheet for Healthcare Providers:  IncredibleEmployment.be  This test is no t yet approved or cleared by the [place] FDA and  has been authorized for detection and/or diagnosis of SARS-CoV-2 by FDA under an Emergency Use Authorization (EUA). This EUA will remain  in effect (meaning this test can be used) for the duration of the COVID-19 declaration under Section 564(b)(1) of the Act, 21 U.S.C.section 360bbb-3(b)(1), unless the authorization is terminated  or revoked sooner.       Influenza A by PCR NEGATIVE NEGATIVE Final   Influenza B by PCR NEGATIVE NEGATIVE Final    Comment: (NOTE) The Xpert Xpress SARS-CoV-2/FLU/RSV plus assay is intended as an aid in the diagnosis of influenza from Nasopharyngeal swab specimens and should not be used as a sole basis for treatment. Nasal washings and aspirates are unacceptable for Xpert Xpress SARS-CoV-2/FLU/RSV testing.  Fact Sheet for Patients: EntrepreneurPulse.com.au  Fact Sheet for Healthcare Providers: IncredibleEmployment.be  This test is not yet approved or cleared by the [place] FDA and has been authorized for detection and/or diagnosis of SARS-CoV-2 by FDA under an Emergency Use Authorization (EUA). This EUA will remain in effect (meaning this test can be used) for the  duration of the COVID-19 declaration under Section 564(b)(1) of the Act, 21 U.S.C. section 360bbb-3(b)(1), unless the authorization  is terminated or revoked.  Performed at [facility], [address] [place], [place] [address]   Culture, blood (routine x 2)     Status: None (Preliminary result)   Collection Time: 06/09/21  7:28 AM   Specimen: BLOOD  Result Value Ref Range Status   Specimen Description BLOOD RIGHT UPPER ARM  Final   Special Requests   Final    BOTTLES DRAWN AEROBIC AND ANAEROBIC Blood Culture adequate volume   Culture   Final    NO GROWTH 4 DAYS Performed at [facility], [address], [place], [place] [address]    Report Status PENDING  Incomplete  Culture, blood (routine x 2)     Status: None (Preliminary result)   Collection Time: 06/09/21  8:09 AM   Specimen: BLOOD  Result Value Ref Range Status   Specimen Description BLOOD RIGHT HAND  Final   Special Requests   Final    BOTTLES DRAWN AEROBIC AND ANAEROBIC Blood Culture adequate volume   Culture   Final    NO GROWTH 4 DAYS Performed at [facility], [address] [place], [place] [address]    Report Status PENDING  Incomplete  Expectorated Sputum Assessment w Gram Stain, Rflx to Resp Cult     Status: None   Collection Time: 06/09/21  2:28 PM   Specimen: Expectorated Sputum  Result Value Ref Range Status   Specimen Description EXPECTORATED SPUTUM  Final   Special Requests NONE  Final   Sputum evaluation   Final    THIS SPECIMEN IS ACCEPTABLE FOR SPUTUM CULTURE Performed at [facility], [address] [place], [place] [address]    Report Status 06/09/2021 FINAL  Final  Culture, Respiratory w Gram Stain     Status: None   Collection Time: 06/09/21  2:28 PM  Result Value Ref Range Status   Specimen Description   Final    EXPECTORATED SPUTUM Performed at [facility], [address] [place], [place] [address]    Special Requests   Final     NONE Reflexed from [id number] Performed at [facility], [address] [place], [place] [address]    Gram Stain   Final    FEW SQUAMOUS EPITHELIAL CELLS PRESENT FEW WBC PRESENT,BOTH PMN AND MONONUCLEAR RARE GRAM POSITIVE COCCI IN PAIRS    Culture   Final    FEW Normal respiratory flora-no Staph aureus or Pseudomonas seen Performed at [facility], [place] [address] [place], [place] [address]    Report Status 06/12/2021 FINAL  Final     Labs: BNP (last 3 results) Recent Labs    05/11/21 1628 05/12/21 1928 05/18/21 0143  BNP 3,779.1* >4,500.0* >2,025.4*   Basic Metabolic Panel: Recent Labs  Lab 06/09/21 2706 06/10/21 0809 06/11/21 0538 06/12/21 0533 06/13/21 0627  NA 136 134* 135 136 134*  K 3.6 4.1 4.2 4.5 3.7  CL 95* 94* 93* 97* 95*  CO2 _0 GLUCOSE 106* 171* 143* 125* 156*  BUN 27* 41* 32* 42* 30*  CREATININE 3.62* 4.72* 3.43* 4.49* 3.56*  CALCIUM 9.0 9.0 8.6* 8.6* 8.6*  MG 1.8  --   --   --   --    Liver Function Tests: Recent Labs  Lab 06/09/21 0626  AST 22  ALT 13  ALKPHOS 74  BILITOT 0.4  PROT 7.2  ALBUMIN 2.8*   No results for input(s): LIPASE, AMYLASE in the last 168 hours. No results  for input(s): AMMONIA in the last 168 hours. CBC: Recent Labs  Lab 06/09/21 0626 06/10/21 0809 06/11/21 0538 06/12/21 0533 06/13/21 0627  WBC 11.6* 9.3 7.9 6.5 6.6  NEUTROABS 10.6*  --   --   --   --   HGB 8.5* 9.2* 7.9* 7.8* 7.6*  HCT 28.4* 30.7* 25.7* 26.0* 24.9*  MCV 90.7 94.2 90.5 92.5 91.5  PLT 232 173 156 145* 151   Cardiac Enzymes: No results for input(s): CKTOTAL, CKMB, CKMBINDEX, TROPONINI in the last 168 hours. BNP: Invalid input(s): POCBNP CBG: Recent Labs  Lab 06/12/21 2037 06/12/21 2340 06/13/21 0323 06/13/21 0819 06/13/21 1122  GLUCAP 122* 200* 166* 144* 229*   D-Dimer No results for input(s): DDIMER in the last 72 hours. Hgb A1c No results for input(s): HGBA1C in the last 72 hours. Lipid Profile No  results for input(s): CHOL, HDL, LDLCALC, TRIG, CHOLHDL, LDLDIRECT in the last 72 hours. Thyroid function studies No results for input(s): TSH, T4TOTAL, T3FREE, THYROIDAB in the last 72 hours.  Invalid input(s): FREET3 Anemia work up No results for input(s): VITAMINB12, FOLATE, FERRITIN, TIBC, IRON, RETICCTPCT in the last 72 hours. Urinalysis    Component Value Date/Time   COLORURINE YELLOW (A) 06/09/2021 0728   APPEARANCEUR HAZY (A) 06/09/2021 0728   LABSPEC 1.012 06/09/2021 0728   PHURINE 7.0 06/09/2021 0728   GLUCOSEU 50 (A) 06/09/2021 0728   HGBUR NEGATIVE 06/09/2021 0728   BILIRUBINUR NEGATIVE 06/09/2021 0728   KETONESUR NEGATIVE 06/09/2021 0728   PROTEINUR >=300 (A) 06/09/2021 0728   NITRITE NEGATIVE 06/09/2021 0728   LEUKOCYTESUR LARGE (A) 06/09/2021 0728   Sepsis Labs Invalid input(s): PROCALCITONIN,  WBC,  LACTICIDVEN Microbiology Recent Results (from the past 240 hour(s))  Resp Panel by RT-PCR (Flu A&B, Covid) Nasopharyngeal Swab     Status: None   Collection Time: 06/09/21  7:24 AM   Specimen: Nasopharyngeal Swab; Nasopharyngeal(NP) swabs in vial transport medium  Result Value Ref Range Status   SARS Coronavirus 2 by RT PCR NEGATIVE NEGATIVE Final    Comment: (NOTE) SARS-CoV-2 target nucleic acids are NOT DETECTED.  The SARS-CoV-2 RNA is generally detectable in upper respiratory specimens during the acute phase of infection. The lowest concentration of SARS-CoV-2 viral copies this assay can detect is 138 copies/mL. A negative result does not preclude SARS-Cov-2 infection and should not be used as the sole basis for treatment or other patient management decisions. A negative result may occur with  improper specimen collection/handling, submission of specimen other than nasopharyngeal swab, presence of viral mutation(s) within the areas targeted by this assay, and inadequate number of viral copies(<138 copies/mL). A negative result must be combined with clinical  observations, patient history, and epidemiological information. The expected result is Negative.  Fact Sheet for Patients:  EntrepreneurPulse.com.au  Fact Sheet for Healthcare Providers:  IncredibleEmployment.be  This test is no t yet approved or cleared by the [place] FDA and  has been authorized for detection and/or diagnosis of SARS-CoV-2 by FDA under an Emergency Use Authorization (EUA). This EUA will remain  in effect (meaning this test can be used) for the duration of the COVID-19 declaration under Section 564(b)(1) of the Act, 21 U.S.C.section 360bbb-3(b)(1), unless the authorization is terminated  or revoked sooner.       Influenza A by PCR NEGATIVE NEGATIVE Final   Influenza B by PCR NEGATIVE NEGATIVE Final    Comment: (NOTE) The Xpert Xpress SARS-CoV-2/FLU/RSV plus assay is intended as an aid in the diagnosis of influenza from Nasopharyngeal  swab specimens and should not be used as a sole basis for treatment. Nasal washings and aspirates are unacceptable for Xpert Xpress SARS-CoV-2/FLU/RSV testing.  Fact Sheet for Patients: EntrepreneurPulse.com.au  Fact Sheet for Healthcare Providers: IncredibleEmployment.be  This test is not yet approved or cleared by the [place] FDA and has been authorized for detection and/or diagnosis of SARS-CoV-2 by FDA under an Emergency Use Authorization (EUA). This EUA will remain in effect (meaning this test can be used) for the duration of the COVID-19 declaration under Section 564(b)(1) of the Act, 21 U.S.C. section 360bbb-3(b)(1), unless the authorization is terminated or revoked.  Performed at [facility], [address] [place], [place] [address]   Culture, blood (routine x 2)     Status: None (Preliminary result)   Collection Time: 06/09/21  7:28 AM   Specimen: BLOOD  Result Value Ref Range Status   Specimen Description BLOOD RIGHT  UPPER ARM  Final   Special Requests   Final    BOTTLES DRAWN AEROBIC AND ANAEROBIC Blood Culture adequate volume   Culture   Final    NO GROWTH 4 DAYS Performed at [facility], [address] [place], [place] [address]    Report Status PENDING  Incomplete  Culture, blood (routine x 2)     Status: None (Preliminary result)   Collection Time: 06/09/21  8:09 AM   Specimen: BLOOD  Result Value Ref Range Status   Specimen Description BLOOD RIGHT HAND  Final   Special Requests   Final    BOTTLES DRAWN AEROBIC AND ANAEROBIC Blood Culture adequate volume   Culture   Final    NO GROWTH 4 DAYS Performed at [facility], [address] [place], [place] [address]    Report Status PENDING  Incomplete  Expectorated Sputum Assessment w Gram Stain, Rflx to Resp Cult     Status: None   Collection Time: 06/09/21  2:28 PM   Specimen: Expectorated Sputum  Result Value Ref Range Status   Specimen Description EXPECTORATED SPUTUM  Final   Special Requests NONE  Final   Sputum evaluation   Final    THIS SPECIMEN IS ACCEPTABLE FOR SPUTUM CULTURE Performed at [facility], [address] [place], [place] [address]    Report Status 06/09/2021 FINAL  Final  Culture, Respiratory w Gram Stain     Status: None   Collection Time: 06/09/21  2:28 PM  Result Value Ref Range Status   Specimen Description   Final    EXPECTORATED SPUTUM Performed at [facility], [address], [place], [place] [address]    Special Requests   Final    NONE Reflexed from [id number] Performed at [facility], [address] [place], [place] [address]    Gram Stain   Final    FEW SQUAMOUS EPITHELIAL CELLS PRESENT FEW WBC PRESENT,BOTH PMN AND MONONUCLEAR RARE GRAM POSITIVE COCCI IN PAIRS    Culture   Final    FEW Normal respiratory flora-no Staph aureus or Pseudomonas seen Performed at [facility], [place] [address] [place], [place] [address]    Report Status  06/12/2021 FINAL  Final     Time coordinating discharge: Over 30 minutes  SIGNED:   Little Ishikawa, DO [facility] 06/13/2021, 12:20 PM Pager   If 7PM-7AM, please contact night-coverage [email or web address]

## 2021-06-12 NOTE — Progress Notes (Signed)
PT Cancellation Note  Patient Details Name: [name] MRN: [id number] DOB: 1960/12/03   Cancelled Treatment:    Reason Eval/Treat Not Completed: Other (comment).  Pt declined due to fatigue, mild confusion.  Will retry as time and pt allow.   Ramond Dial 06/12/2021, 2:15 PM  Mee Hives, PT MS Acute Rehab Dept. Number: [place]

## 2021-06-12 NOTE — Plan of Care (Signed)
  Problem: Education: Goal: Knowledge of General Education information will improve Description Including pain rating scale, medication(s)/side effects and non-pharmacologic comfort measures Outcome: Progressing   Problem: Health Behavior/Discharge Planning: Goal: Ability to manage health-related needs will improve Outcome: Progressing   

## 2021-06-12 NOTE — Progress Notes (Signed)
[facility]  ROUNDING NOTE   Subjective:   [name] was admitted to [facility] on 06/09/2021 for Hypoglycemia [E16.2] HCAP (healthcare-associated pneumonia) [J18.9] Hypothermia, initial encounter [T68.XXXA] Acute metabolic encephalopathy [E75.17] Sepsis with acute hypoxic respiratory failure without septic shock, due to unspecified organism ([place]) [A41.9, R65.20, J96.01]  Patient found to have hypoglycemia and pneumonia. Last hemodialysis treatment was 06/08/21.    Patient seen during dialysis   HEMODIALYSIS FLOWSHEET:  Blood Flow Rate (mL/min): 400 mL/min Arterial Pressure (mmHg): -180 mmHg Venous Pressure (mmHg): 120 mmHg Transmembrane Pressure (mmHg): 70 mmHg Ultrafiltration Rate (mL/min): 500 mL/min Dialysate Flow Rate (mL/min): 500 ml/min Conductivity: Machine : 13.7 Conductivity: Machine : 13.7  Complains of pain when sitting, staff repositioning and given Tylenol  No other complaints at this time    Objective:  Vital signs in last 24 hours:  Temp:  [98.1 F (36.7 C)-98.5 F (36.9 C)] 98.5 F (36.9 C) (08/26 1253) Pulse Rate:  [68-85] 79 (08/26 1253) Resp:  [14-25] 16 (08/26 1253) BP: (88-205)/(70-145) 171/88 (08/26 1253) SpO2:  [92 %-100 %] 92 % (08/26 1253)  Weight change:  Filed Weights   06/09/21 0654 06/10/21 0638  Weight: 60.1 kg 61.1 kg    Intake/Output: I/O last 3 completed shifts: In: 390 [P.O.:390] Out: 300 [Urine:300]   Intake/Output this shift:  No intake/output data recorded.  Physical Exam: General: NAD, sitting up in bed  Head: Normocephalic, atraumatic. Moist oral mucosal membranes  Eyes: Anicteric  Lungs:  Clear to auscultation, normal effort  Heart: Regular rate and rhythm  Abdomen:  Soft, nontender  Extremities:  Bilateral BKA  Neurologic: Alert and oriented x 4. Slow to respond.   Skin: No lesions  Access: Left AVF    Basic Metabolic Panel: Recent Labs  Lab 06/09/21 0626 06/10/21 0809 06/11/21 0538  06/12/21 0533  NA 136 134* 135 136  K 3.6 4.1 4.2 4.5  CL 95* 94* 93* 97*  CO2 31 26 29 28   GLUCOSE 106* 171* 143* 125*  BUN 27* 41* 32* 42*  CREATININE 3.62* 4.72* 3.43* 4.49*  CALCIUM 9.0 9.0 8.6* 8.6*  MG 1.8  --   --   --      Liver Function Tests: Recent Labs  Lab 06/09/21 0626  AST 22  ALT 13  ALKPHOS 74  BILITOT 0.4  PROT 7.2  ALBUMIN 2.8*    No results for input(s): LIPASE, AMYLASE in the last 168 hours. No results for input(s): AMMONIA in the last 168 hours.  CBC: Recent Labs  Lab 06/09/21 0626 06/10/21 0809 06/11/21 0538 06/12/21 0533  WBC 11.6* 9.3 7.9 6.5  NEUTROABS 10.6*  --   --   --   HGB 8.5* 9.2* 7.9* 7.8*  HCT 28.4* 30.7* 25.7* 26.0*  MCV 90.7 94.2 90.5 92.5  PLT 232 173 156 145*     Cardiac Enzymes: No results for input(s): CKTOTAL, CKMB, CKMBINDEX, TROPONINI in the last 168 hours.  BNP: Invalid input(s): POCBNP  CBG: Recent Labs  Lab 06/11/21 2010 06/11/21 2351 06/12/21 0404 06/12/21 0815 06/12/21 1252  GLUCAP 196* 165* 130* 129* 133*     Microbiology: Results for orders placed or performed during the hospital encounter of 06/09/21  Resp Panel by RT-PCR (Flu A&B, Covid) Nasopharyngeal Swab     Status: None   Collection Time: 06/09/21  7:24 AM   Specimen: Nasopharyngeal Swab; Nasopharyngeal(NP) swabs in vial transport medium  Result Value Ref Range Status   SARS Coronavirus 2 by RT PCR NEGATIVE  NEGATIVE Final    Comment: (NOTE) SARS-CoV-2 target nucleic acids are NOT DETECTED.  The SARS-CoV-2 RNA is generally detectable in upper respiratory specimens during the acute phase of infection. The lowest concentration of SARS-CoV-2 viral copies this assay can detect is 138 copies/mL. A negative result does not preclude SARS-Cov-2 infection and should not be used as the sole basis for treatment or other patient management decisions. A negative result may occur with  improper specimen collection/handling, submission of specimen  other than nasopharyngeal swab, presence of viral mutation(s) within the areas targeted by this assay, and inadequate number of viral copies(<138 copies/mL). A negative result must be combined with clinical observations, patient history, and epidemiological information. The expected result is Negative.  Fact Sheet for Patients:  EntrepreneurPulse.com.au  Fact Sheet for Healthcare Providers:  IncredibleEmployment.be  This test is no t yet approved or cleared by the [place] FDA and  has been authorized for detection and/or diagnosis of SARS-CoV-2 by FDA under an Emergency Use Authorization (EUA). This EUA will remain  in effect (meaning this test can be used) for the duration of the COVID-19 declaration under Section 564(b)(1) of the Act, 21 U.S.C.section 360bbb-3(b)(1), unless the authorization is terminated  or revoked sooner.       Influenza A by PCR NEGATIVE NEGATIVE Final   Influenza B by PCR NEGATIVE NEGATIVE Final    Comment: (NOTE) The Xpert Xpress SARS-CoV-2/FLU/RSV plus assay is intended as an aid in the diagnosis of influenza from Nasopharyngeal swab specimens and should not be used as a sole basis for treatment. Nasal washings and aspirates are unacceptable for Xpert Xpress SARS-CoV-2/FLU/RSV testing.  Fact Sheet for Patients: EntrepreneurPulse.com.au  Fact Sheet for Healthcare Providers: IncredibleEmployment.be  This test is not yet approved or cleared by the [place] FDA and has been authorized for detection and/or diagnosis of SARS-CoV-2 by FDA under an Emergency Use Authorization (EUA). This EUA will remain in effect (meaning this test can be used) for the duration of the COVID-19 declaration under Section 564(b)(1) of the Act, 21 U.S.C. section 360bbb-3(b)(1), unless the authorization is terminated or revoked.  Performed at [facility], [address]  [place], [place] [address]   Culture, blood (routine x 2)     Status: None (Preliminary result)   Collection Time: 06/09/21  7:28 AM   Specimen: BLOOD  Result Value Ref Range Status   Specimen Description BLOOD RIGHT UPPER ARM  Final   Special Requests   Final    BOTTLES DRAWN AEROBIC AND ANAEROBIC Blood Culture adequate volume   Culture   Final    NO GROWTH 3 DAYS Performed at [facility], [address] [place], [place] [address]    Report Status PENDING  Incomplete  Culture, blood (routine x 2)     Status: None (Preliminary result)   Collection Time: 06/09/21  8:09 AM   Specimen: BLOOD  Result Value Ref Range Status   Specimen Description BLOOD RIGHT HAND  Final   Special Requests   Final    BOTTLES DRAWN AEROBIC AND ANAEROBIC Blood Culture adequate volume   Culture   Final    NO GROWTH 3 DAYS Performed at [facility], [address] [place], [place] [address]    Report Status PENDING  Incomplete  Expectorated Sputum Assessment w Gram Stain, Rflx to Resp Cult     Status: None   Collection Time: 06/09/21  2:28 PM   Specimen: Expectorated Sputum  Result Value Ref Range Status  Specimen Description EXPECTORATED SPUTUM  Final   Special Requests NONE  Final   Sputum evaluation   Final    THIS SPECIMEN IS ACCEPTABLE FOR SPUTUM CULTURE Performed at [facility], [address] [place], [place] [address]    Report Status 06/09/2021 FINAL  Final  Culture, Respiratory w Gram Stain     Status: None   Collection Time: 06/09/21  2:28 PM  Result Value Ref Range Status   Specimen Description   Final    EXPECTORATED SPUTUM Performed at [facility], [address], [place], [place] [address]    Special Requests   Final    NONE Reflexed from [phone] Performed at [facility], [address] [place], [place] [address]    Gram Stain   Final    FEW SQUAMOUS EPITHELIAL CELLS PRESENT FEW WBC PRESENT,BOTH PMN AND MONONUCLEAR RARE GRAM  POSITIVE COCCI IN PAIRS    Culture   Final    FEW Normal respiratory flora-no Staph aureus or Pseudomonas seen Performed at [facility], [address], [place], [place] [address]    Report Status 06/12/2021 FINAL  Final    Coagulation Studies: No results for input(s): LABPROT, INR in the last 72 hours.  Urinalysis: No results for input(s): COLORURINE, LABSPEC, PHURINE, GLUCOSEU, HGBUR, BILIRUBINUR, KETONESUR, PROTEINUR, UROBILINOGEN, NITRITE, LEUKOCYTESUR in the last 72 hours.  Invalid input(s): APPERANCEUR     Imaging: No results found.   Medications:    azithromycin 500 mg (06/12/21 9381)   cefTRIAXone (ROCEPHIN)  IV 2 g (06/11/21 1247)    aspirin  81 mg Oral Daily   atorvastatin  80 mg Oral Daily   carvedilol  25 mg Oral BID WC   Chlorhexidine Gluconate Cloth  6 each Topical Q0600   epoetin (EPOGEN/PROCRIT) injection  10,000 Units Intravenous Q M,W,F-HD   feeding supplement  237 mL Oral TID BM   feeding supplement (NEPRO CARB STEADY)  237 mL Oral BID BM   gabapentin  400 mg Oral QHS   heparin  5,000 Units Subcutaneous Q8H   hydrALAZINE  100 mg Oral Q8H   levETIRAcetam  250 mg Oral Q M,W,F-HD   levETIRAcetam  500 mg Oral Daily   lisinopril  40 mg Oral Daily   multivitamin  1 tablet Oral QHS   sevelamer carbonate  800 mg Oral TID WC   spironolactone  25 mg Oral Daily     Assessment/ Plan:  [name] is a 60 y.o. black male with end stage renal disease on hemodialysis, diabetes mellitus type II, hypertension, CVA, peripheral vascular disease status post bilateral BKA, who is admitted to [facility] on 06/09/2021 for Hypoglycemia [E16.2] HCAP (healthcare-associated pneumonia) [J18.9] Hypothermia, initial encounter [T68.XXXA] Acute metabolic encephalopathy [O17.51] Sepsis with acute hypoxic respiratory failure without septic shock, due to unspecified organism ([place]) [A41.9, R65.20, J96.01]  [facility] [address] MWF L AVF   End Stage Renal  Disease: MWF schedule. Received dialysis today, UF 1L removed. Next treatment scheduled for Monday.   Hypertension: 171/88 with history of chronic systolic and diastolic congestive heart failure. home regimen of carvedilol, hydralazine, isosorbide mononitrate, lisinopril, and spironolactone   Anemia of chronic kidney disease: Hemoglobin 7.8.  - EPO with HD treatments.   Secondary Hyperparathyroidism: continue sevelamer with meals.    LOS: 3   8/26/20221:03 PM

## 2021-06-12 NOTE — Progress Notes (Signed)
PT Cancellation Note  Patient Details Name: [name] MRN: [id number] DOB: Jul 06, 1961   Cancelled Treatment:    Reason Eval/Treat Not Completed: Patient at procedure or test/unavailable.  Retry as time and pt allow.   Ramond Dial 06/12/2021, 10:54 AM  Mee Hives, PT MS Acute Rehab Dept. Number: [place]

## 2021-06-12 NOTE — Chronic Care Management (AMB) (Signed)
Chronic Care Management   CCM RN Visit Note  06/12/2021 Name: [name] MRN: [id number] DOB: 06-15-1961  Subjective: [name] is a 60 y.o. year old male who is a primary care patient of [name], [name], MD. The care management team was consulted for assistance with disease management and care coordination needs.    Engaged with patient's spouse [name] by telephone for follow up visit in response to provider referral for case management and/or care coordination services.   Consent to Services:  The patient was given information about Chronic Care Management services, agreed to services, and gave verbal consent prior to initiation of services.  Please see initial visit note for detailed documentation.    Assessment: Review of patient past medical history, allergies, medications, health status, including review of consultants reports, laboratory and other test data, was performed as part of comprehensive evaluation and provision of chronic care management services.   SDOH (Social Determinants of Health) assessments and interventions performed:  No  CCM Care Plan  Allergies  Allergen Reactions   Methotrexate Other (See Comments)    Blood count drops   Vancomycin Shortness Of Breath    Eyes watering, SOB, wheezing   Cefepime Other (See Comments)    Confusion    Tape     Facility-Administered Encounter Medications as of 06/12/2021  Medication   acetaminophen (TYLENOL) tablet 650 mg   Or   acetaminophen (TYLENOL) suppository 650 mg   acetaminophen (TYLENOL) tablet 1,000 mg   aspirin chewable tablet 81 mg   atorvastatin (LIPITOR) tablet 80 mg   azithromycin (ZITHROMAX) 500 mg in sodium chloride 0.9 % 250 mL IVPB   carvedilol (COREG) tablet 25 mg   cefTRIAXone (ROCEPHIN) 2 g in sodium chloride 0.9 % 100 mL IVPB   Chlorhexidine Gluconate Cloth 2 % PADS 6 each   epoetin alfa (EPOGEN) injection 10,000 Units   feeding supplement (ENSURE ENLIVE / ENSURE PLUS) liquid 237 mL    feeding supplement (NEPRO CARB STEADY) liquid 237 mL   gabapentin (NEURONTIN) capsule 400 mg   heparin injection 5,000 Units   hydrALAZINE (APRESOLINE) tablet 100 mg   levETIRAcetam (KEPPRA) tablet 250 mg   levETIRAcetam (KEPPRA) tablet 500 mg   lisinopril (ZESTRIL) tablet 40 mg   multivitamin (RENA-VIT) tablet 1 tablet   ondansetron (ZOFRAN) tablet 4 mg   Or   ondansetron (ZOFRAN) injection 4 mg   sevelamer carbonate (RENVELA) tablet 800 mg   spironolactone (ALDACTONE) tablet 25 mg   Outpatient Encounter Medications as of 06/12/2021  Medication Sig Note   acetaminophen (TYLENOL) 500 MG tablet Take 1,000 mg by mouth daily as needed for moderate pain or headache.     Adalimumab ([name] PEN) 40 MG/0.4ML PNKT Inject 40 mg into the muscle once a week.  06/09/2021: Thursday    Alcohol Swabs PADS Use as directed to check blood sugar three times daily for insulin dependent type 2 diabetes.    aspirin 81 MG chewable tablet Chew 81 mg by mouth daily.    atorvastatin (LIPITOR) 80 MG tablet TAKE 1 TABLET(80 MG) BY MOUTH DAILY    Blood Glucose Monitoring Suppl (ONE TOUCH ULTRA 2) w/Device KIT Use as directed to check blood sugar three times daily. E11.9    carvedilol (COREG) 25 MG tablet Take 1 tablet (25 mg total) by mouth 2 (two) times daily.    feeding supplement (ENSURE ENLIVE / ENSURE PLUS) LIQD Take 237 mLs by mouth 3 (three) times daily between meals.    furosemide (  LASIX) 80 MG tablet Take 80 mg by mouth 2 (two) times daily. (Patient not taking: No sig reported)    gabapentin (NEURONTIN) 100 MG capsule Take 100 mg by mouth at bedtime. Take along with 374m capsule at bedtime (Patient not taking: No sig reported)    gabapentin (NEURONTIN) 400 MG capsule Take 400 mg by mouth at bedtime.    hydrALAZINE (APRESOLINE) 100 MG tablet Take 1 tablet (100 mg total) by mouth every 8 (eight) hours.    isosorbide dinitrate (ISORDIL) 20 MG tablet Take 1 tablet (20 mg total) by mouth 3 (three) times daily.  (Patient not taking: No sig reported)    levETIRAcetam (KEPPRA) 250 MG tablet Take 250 mg by mouth daily as needed. 06/09/2021: Patient takes as needed with dialysis   levETIRAcetam (KEPPRA) 500 MG tablet Take 1 tablet (500 mg total) by mouth daily.    lisinopril (ZESTRIL) 40 MG tablet Take 40 mg by mouth daily.    multivitamin (RENA-VIT) TABS tablet Take 1 tablet by mouth at bedtime.    sevelamer carbonate (RENVELA) 800 MG tablet TAKE 1 TABLET(800 MG) BY MOUTH THREE TIMES DAILY 02/06/2021: Takes differently: 1 tablet by mouth twice daily (Due to patient meal times)    spironolactone (ALDACTONE) 25 MG tablet Take 1 tablet by mouth at bedtime.     Patient Active Problem List   Diagnosis Date Noted   Unstageable pressure ulcer of sacral region ([place] 05/22/2021   Acute decompensated heart failure ([facility] 05/19/2021   Acute respiratory failure with hypoxia (HCC) 05/18/2021   Protein-calorie malnutrition, severe 05/14/2021   Acute pulmonary edema (HCC)    Shortness of breath    Fluid overload 05/12/2021   Cellulitis and abscess of buttock    History of GI bleed 02/06/2021   Long term current use of immunosuppressive drug 02/06/2021   Disorder of skin due to Crohn's disease ([place] 077/82/4235  Complication of vascular access for dialysis 01/22/2021   Polyp of transverse colon    Acute metabolic encephalopathy 036/14/4315  Acute on chronic anemia 040/05/6760  Chronic systolic CHF (congestive heart failure) ([facility] 11/18/2020   Cerebrovascular disease 09/12/2020   Transient alteration of awareness 09/12/2020   Hyperkalemia 09/12/2020   Mild cognitive impairment 09/30/2019   Peripheral vascular disease ([facility] 06/28/2018   Sepsis ([place] 01/12/2018   Pressure injury of skin 12/04/2017   S/P bilateral BKA (below knee amputation) ([facility] 10/20/2017   Atherosclerosis of native arteries of extremity with rest pain ([place] 08/05/2017   History of CVA (cerebrovascular accident) 04/15/2017   Seizure disorder  ([facility] 04/15/2017   End stage renal failure on dialysis ([place] 04/12/2016   Aphthae 02/20/2016   Hidradenitis suppurativa 02/20/2016   Leg pain 02/20/2016   Neuropathy 02/20/2016   Narrowing of intervertebral disc space 08/29/2015   Vascular disorder of lower extremity 08/29/2015   Failure of erection 08/29/2015   Hypercholesteremia 08/29/2015   Hypertension 08/29/2015   Anemia due to chronic kidney disease 05/26/2015   Venous insufficiency of leg 09/04/2014   History of deep vein thrombosis (DVT) of lower extremity 08/16/2014   Prostatic intraepithelial neoplasia 11/02/2013   Elevated prostate specific antigen (PSA) 09/11/2013   Benign prostatic hyperplasia with urinary obstruction 08/13/2013   Spermatocele 08/13/2013   Avitaminosis D 01/25/2013   Type 2 diabetes mellitus with hypoglycemia without coma ([facility] 03/28/2012   Crohn disease ([place] 08/03/2011    Conditions to be addressed/monitored:ESRD and Falls  Patient Care Plan: ESRD     Problem Identified: Disease Progression  Long-Range Goal: Disease Progression Prevented or Minimized   Start Date: 06/12/2021  Expected End Date: 10/10/2021  Priority: High  Note:    Current Barriers:  Chronic Disease Management support and educational needs r/t End Stage Renal Disease in patient with Diabetes.   Goals: Over the next 120 days, patient will not require hospitalization or emergent care d/t complications r/t End Stage Renal Disease.  Interventions:  Collaboration with [name], MD regarding development and update of comprehensive plan of care as evidenced by provider attestation and co-signature Inter-disciplinary care team collaboration (see longitudinal plan of care) Reviewed current treatment plan. Currently attends hemodialysis on Monday, Wednesday and Friday. His condition is complicated by anemia. Reports requiring a blood transfusion two weeks ago. He is also scheduled for imaging and follow up with the  vascular team due to complications/bleeding from his fistula.  Reviewed medications. Advised to administer as prescribed and adjust/hold medications when instructed by his Nephrologist. Encouraged to notify the care management team with concerns r/t medication management or prescription costs. Discussed nutritional intake and compliance with the recommended diet. Reports decreased appetite due to fatigue after dialysis treatments. He reports eating breakfast without difficulty Notices a decreased appetite for the remainder of the day. We discussed options for Nepro as a nutritional supplement, but he declined. He does reports eating a high protein breakfast and a small protein snack later in the day. His spouse in interested in options for an appetite stimulate if his appetite does not improve. Update on 06/12/21. Spoke with patient's [employer]. Reports patient is currently hospitalized. Reports he was hospitalized from skilled nursing after experiencing unrelieved shortness of breath. She is unsure of anticipated discharge date but indicates patient will be transferred back to skilled nursing. She requested assistance with contacting the Palliative Care team to update needs. She agreed to contact the care management team with updates anticipated length of stay in SNF and possible in-home needs.   Self-Care Activities/Patient Goals: Administer medications as prescribed. Complete recommended labs and attend provider appointments as scheduled. Adhere to recommended high protein diet and fluid restrictions. Follow recommended safety precautions. Contact the provider or care management team with questions and new concerns as needed        Patient Care Plan: Fall Risk (Adult)     Problem Identified: Fall Risk      Long-Range Goal: Absence of Fall and Fall-Related Injury   Start Date: 06/12/2021  Expected End Date: 10/10/2021  Priority: High  Note:   Current Barriers:  High Risk  for Falls r/t bilateral amputations below the knee. Unable to perform ADLs independently Unable to perform IADLs independently   Clinical Goal(s):  Over the next 120 days, patient will not experience falls or require emergent treatment d/t fall related injuries.  Interventions:  Collaboration with [name], MD regarding development and update of comprehensive plan of care as evidenced by provider attestation and co-signature Inter-disciplinary care team collaboration (see longitudinal plan of care) Reviewed information regarding safety and fall prevention. Assessed for recent falls. Reports falling twice within the past few weeks. One fall occurred when attempting to transfer from his chair to the shower bench. Reports another fall occurred when attempting to transfer out of bed. Denies injuries. Reports not informing his provider to complete a follow-up evaluation. His spouse indicates that he did not notify her when attempting to transfer and she was initially unaware of his fall. He denies concerns complaints of discomfort today. Discussed need to ensure assistance is available  when attempting to transfer. He was strongly advised to avoid attempting to transfer when he feels weak due his high risk for falls.  Reviewed in home needs.  His spouse continues to serve as his primary care giver. Declined current need for additional assistance. Agreed to update the team if this changes and assistance is needed.  Update on 06/12/21: Spoke with patient's spouse/caregiver [name]. Reports he experienced fall while in the skilled nursing facility. He is currently hospitalized with anticipated discharge back to SNF. Agreed to update the care management team with in-home needs.   Self-Care Deficits/Patient Goals:  Utilize wheelchair and utilize proper transfer techniques Avoid attempting to transfer without assistance or when he feels weak. Keep pathways clear and well lit Avoid overreaching to  prevent accidental wheelchair tilting Adhere to recommended safety precautions Update the care management team regarding change in functional status      PLAN A member of the care management team will follow up with [name] or his spouse within the next month.   [facility] Care Management [facility] [phone]

## 2021-06-12 NOTE — Care Management Important Message (Signed)
Important Message  Patient Details  Name: [name] MRN: [id number] Date of Birth: 05/28/1961   Medicare Important Message Given:  Yes     [name] 06/12/2021, 1:37 PM

## 2021-06-12 NOTE — Progress Notes (Signed)
PROGRESS NOTE    [name]  YQM:[id number] DOB: 1961-01-19 DOA: 06/09/2021 PCP: [name], MD   Brief Narrative:  [name] is a 60 y.o. male with medical history significant for DM of end-stage renal disease on dialysis, M/W/F, HTN, seizure disorder, CVA, peripheral arterial disease status post bilateral BKA, hydradenitis suppurativa and cutaneous Crohn's on [name] and as needed Augmentin for flareups, followed by [facility] dermatology, status post recent hospitalization for acute respiratory failure who was discharged to skilled nursing facility for subacute rehab and was sent to the emergency room for evaluation of mental status changes. Found to have profound hypoglycemia at 29 with concurrent hypoxia, concern for re-initiation of insulin at SNF despite being off insulin for years per family discussion.  Assessment & Plan:  Acute metabolic encephalopathy on chronic vascular dementia, waxing/waning Cannot rule out hospital delirium Symptomatic hypoglycemia  Concurrent acute hypoxia (as below) Most likely related to hypoglycemia - possible re-initiation of insulin which patient is not currently on at home per fmaily Initially requiring dextrose IV fluids, now tolerating p.o. more appropriately IV fluids have been discontinued Baseline vascular dementia per wife, worsening over the past 6 months to year being followed up with neurology in the outpatient setting Somewhat combative/verbally yelling out this afternoon - concern for sundown/hospital delirium -we will follow over the next 24 hours, if improving back to baseline we will consider discharge back to facility   Uncontrolled diabetes mellitus with complications of end-stage renal disease on hemodialysis as well as hypoglycemia Patient has a history of diabetes mellitus and per his wife has been off insulin for years due to intolerace/hypoglycemia Given prolonged hypoglycemia concern the patient received long-acting  glargine/similar insulin outpatient Dextrose infusion discontinued given increased p.o. intake more appropriate, glucose currently well controlled Dialysis days are M/W/F  Acute hypoxic respiratory failure secondary to pneumonia Cannot rule out aspiration vs CAP Noted on chest x-ray Patient recently discharged from the hospital and completed antibiotic therapy for CAP Cover prophylactically with azithromycin and ceftriaxone -low threshold to discontinue in the next 24 hours if no worsening symptoms CT shows bilateral airspace disease and mild effusions Speech therapy consult for swallow function evaluation  Chronic anemia of chronic disease - ESRD H/H Stable Continue iron supplements   History of CVA (cerebrovascular accident) Concurrent vascular dementia, chronic Post stroke seizure disorder and cognitive deficit CT head with no acute findings and patient with no focal deficit Continue antiplatelets and statins  Continue Keppra for seizure prophylaxis   Peripheral arterial disease S/P bilateral BKA (below knee amputation) ([place])  Increase nursing assistance for transfers.  Trapeze above bed if  desired Continue statins, aspirin and metoprolol  Chronic combined systolic and diastolic CHF (congestive heart failure) ([place]) Not acutely exacerbated on clincal exam Last echo from August 2022 with EF 35 to 40% and grade 2 diastolic dysfunction Continue lisinopril, spironolactone and carvedilol Volume management with HD   Essential hypertension Continue nitrates, lisinopril, Coreg and hydralazine   DVT prophylaxis: Heparin  Code Status: full code  Family Communication: At bedside  Status is: Inpatient  Dispo: The patient is from: SNF              Anticipated d/c is to: SNF              Anticipated d/c date is: 48-72h              Patient currently not medically stable for discharge  Consultants:  Nephrology  Procedures:  None  Antimicrobials:  Azithromycin,  ceftriaxone  Subjective: No acute issues or events overnight denies nausea vomiting diarrhea constipation headache fevers chills or chest pain; late afternoon patient calling out yelling randomly, concern for hospital delirium, wife notified hopefully she will be able to be at bedside to assist  Objective: Vitals:   06/12/21 1222 06/12/21 1253 06/12/21 1445 06/12/21 1500  BP:  (!) 171/88    Pulse: 73 79  80  Resp: 15 16    Temp:  98.5 F (36.9 C)    TempSrc:      SpO2: 100% 92% 98% 100%  Weight:      Height:        Intake/Output Summary (Last 24 hours) at 06/12/2021 1657 Last data filed at 06/12/2021 1152 Gross per 24 hour  Intake 150 ml  Output 1000 ml  Net -850 ml    Filed Weights   06/09/21 0654 06/10/21 0638  Weight: 60.1 kg 61.1 kg    Examination:  General exam: Appears calm and comfortable  Respiratory system: Clear to auscultation. Respiratory effort normal. Cardiovascular system: S1 & S2 heard, RRR. No JVD, murmurs, rubs, gallops or clicks. No pedal edema. Gastrointestinal system: Abdomen is nondistended, soft and nontender. No organomegaly or masses felt. Normal bowel sounds heard. Central nervous system: Alert and oriented to person only. Extremities: Symmetric 5 x 5 power. BLE BKA Skin: No rashes, lesions or ulcers Psychiatry: Judgement and insight appear normal. Mood & affect appropriate.     Data Reviewed: I have personally reviewed following labs and imaging studies  CBC: Recent Labs  Lab 06/09/21 0626 06/10/21 0809 06/11/21 0538 06/12/21 0533  WBC 11.6* 9.3 7.9 6.5  NEUTROABS 10.6*  --   --   --   HGB 8.5* 9.2* 7.9* 7.8*  HCT 28.4* 30.7* 25.7* 26.0*  MCV 90.7 94.2 90.5 92.5  PLT 232 173 156 145*    Basic Metabolic Panel: Recent Labs  Lab 06/09/21 0626 06/10/21 0809 06/11/21 0538 06/12/21 0533  NA 136 134* 135 136  K 3.6 4.1 4.2 4.5  CL 95* 94* 93* 97*  CO2 31 26 29 28   GLUCOSE 106* 171* 143* 125*  BUN 27* 41* 32* 42*  CREATININE  3.62* 4.72* 3.43* 4.49*  CALCIUM 9.0 9.0 8.6* 8.6*  MG 1.8  --   --   --     GFR: Estimated Creatinine Clearance: 15.5 mL/min (A) (by C-G formula based on SCr of 4.49 mg/dL (H)). Liver Function Tests: Recent Labs  Lab 06/09/21 0626  AST 22  ALT 13  ALKPHOS 74  BILITOT 0.4  PROT 7.2  ALBUMIN 2.8*    No results for input(s): LIPASE, AMYLASE in the last 168 hours. No results for input(s): AMMONIA in the last 168 hours. Coagulation Profile: No results for input(s): INR, PROTIME in the last 168 hours. Cardiac Enzymes: No results for input(s): CKTOTAL, CKMB, CKMBINDEX, TROPONINI in the last 168 hours. BNP (last 3 results) No results for input(s): PROBNP in the last 8760 hours. HbA1C: No results for input(s): HGBA1C in the last 72 hours. CBG: Recent Labs  Lab 06/11/21 2010 06/11/21 2351 06/12/21 0404 06/12/21 0815 06/12/21 1252  GLUCAP 196* 165* 130* 129* 133*    Lipid Profile: No results for input(s): CHOL, HDL, LDLCALC, TRIG, CHOLHDL, LDLDIRECT in the last 72 hours. Thyroid Function Tests: No results for input(s): TSH, T4TOTAL, FREET4, T3FREE, THYROIDAB in the last 72 hours. Anemia Panel: No results for input(s): VITAMINB12, FOLATE, FERRITIN, TIBC, IRON, RETICCTPCT in the last 72 hours. Sepsis Labs:  Recent Labs  Lab 06/09/21 0626 06/09/21 0935 06/10/21 0809  PROCALCITON 0.35  --  0.52  LATICACIDVEN 1.0 0.6  --      Recent Results (from the past 240 hour(s))  Resp Panel by RT-PCR (Flu A&B, Covid) Nasopharyngeal Swab     Status: None   Collection Time: 06/09/21  7:24 AM   Specimen: Nasopharyngeal Swab; Nasopharyngeal(NP) swabs in vial transport medium  Result Value Ref Range Status   SARS Coronavirus 2 by RT PCR NEGATIVE NEGATIVE Final    Comment: (NOTE) SARS-CoV-2 target nucleic acids are NOT DETECTED.  The SARS-CoV-2 RNA is generally detectable in upper respiratory specimens during the acute phase of infection. The lowest concentration of SARS-CoV-2  viral copies this assay can detect is 138 copies/mL. A negative result does not preclude SARS-Cov-2 infection and should not be used as the sole basis for treatment or other patient management decisions. A negative result may occur with  improper specimen collection/handling, submission of specimen other than nasopharyngeal swab, presence of viral mutation(s) within the areas targeted by this assay, and inadequate number of viral copies(<138 copies/mL). A negative result must be combined with clinical observations, patient history, and epidemiological information. The expected result is Negative.  Fact Sheet for Patients:  EntrepreneurPulse.com.au  Fact Sheet for Healthcare Providers:  IncredibleEmployment.be  This test is no t yet approved or cleared by the [place] FDA and  has been authorized for detection and/or diagnosis of SARS-CoV-2 by FDA under an Emergency Use Authorization (EUA). This EUA will remain  in effect (meaning this test can be used) for the duration of the COVID-19 declaration under Section 564(b)(1) of the Act, 21 U.S.C.section 360bbb-3(b)(1), unless the authorization is terminated  or revoked sooner.       Influenza A by PCR NEGATIVE NEGATIVE Final   Influenza B by PCR NEGATIVE NEGATIVE Final    Comment: (NOTE) The Xpert Xpress SARS-CoV-2/FLU/RSV plus assay is intended as an aid in the diagnosis of influenza from Nasopharyngeal swab specimens and should not be used as a sole basis for treatment. Nasal washings and aspirates are unacceptable for Xpert Xpress SARS-CoV-2/FLU/RSV testing.  Fact Sheet for Patients: EntrepreneurPulse.com.au  Fact Sheet for Healthcare Providers: IncredibleEmployment.be  This test is not yet approved or cleared by the [place] FDA and has been authorized for detection and/or diagnosis of SARS-CoV-2 by FDA under an Emergency Use Authorization  (EUA). This EUA will remain in effect (meaning this test can be used) for the duration of the COVID-19 declaration under Section 564(b)(1) of the Act, 21 U.S.C. section 360bbb-3(b)(1), unless the authorization is terminated or revoked.  Performed at [facility], [address] [place], [place] [address]   Culture, blood (routine x 2)     Status: None (Preliminary result)   Collection Time: 06/09/21  7:28 AM   Specimen: BLOOD  Result Value Ref Range Status   Specimen Description BLOOD RIGHT UPPER ARM  Final   Special Requests   Final    BOTTLES DRAWN AEROBIC AND ANAEROBIC Blood Culture adequate volume   Culture   Final    NO GROWTH 3 DAYS Performed at [facility], [address], [place], [place] [address]    Report Status PENDING  Incomplete  Culture, blood (routine x 2)     Status: None (Preliminary result)   Collection Time: 06/09/21  8:09 AM   Specimen: BLOOD  Result Value Ref Range Status   Specimen Description BLOOD RIGHT HAND  Final   Special Requests  Final    BOTTLES DRAWN AEROBIC AND ANAEROBIC Blood Culture adequate volume   Culture   Final    NO GROWTH 3 DAYS Performed at [facility], [address] [place], [place] [address]    Report Status PENDING  Incomplete  Expectorated Sputum Assessment w Gram Stain, Rflx to Resp Cult     Status: None   Collection Time: 06/09/21  2:28 PM   Specimen: Expectorated Sputum  Result Value Ref Range Status   Specimen Description EXPECTORATED SPUTUM  Final   Special Requests NONE  Final   Sputum evaluation   Final    THIS SPECIMEN IS ACCEPTABLE FOR SPUTUM CULTURE Performed at [facility], [address], [place], [place] [address]    Report Status 06/09/2021 FINAL  Final  Culture, Respiratory w Gram Stain     Status: None   Collection Time: 06/09/21  2:28 PM  Result Value Ref Range Status   Specimen Description   Final    EXPECTORATED SPUTUM Performed at [facility],  [address] [place], [place] [address]    Special Requests   Final    NONE Reflexed from [phone] [id number] Performed at [facility], [address] [place], [place] [address]    Gram Stain   Final    FEW SQUAMOUS EPITHELIAL CELLS PRESENT FEW WBC PRESENT,BOTH PMN AND MONONUCLEAR RARE GRAM POSITIVE COCCI IN PAIRS    Culture   Final    FEW Normal respiratory flora-no Staph aureus or Pseudomonas seen Performed at [facility], [place] [address], [place], [place] [address]    Report Status 06/12/2021 FINAL  Final          Radiology Studies: No results found.      Scheduled Meds:  aspirin  81 mg Oral Daily   atorvastatin  80 mg Oral Daily   carvedilol  25 mg Oral BID WC   Chlorhexidine Gluconate Cloth  6 each Topical Q0600   epoetin (EPOGEN/PROCRIT) injection  10,000 Units Intravenous Q M,W,F-HD   feeding supplement  237 mL Oral TID BM   feeding supplement (NEPRO CARB STEADY)  237 mL Oral BID BM   gabapentin  400 mg Oral QHS   heparin  5,000 Units Subcutaneous Q8H   hydrALAZINE  100 mg Oral Q8H   levETIRAcetam  250 mg Oral Q M,W,F-HD   levETIRAcetam  500 mg Oral Daily   lisinopril  40 mg Oral Daily   multivitamin  1 tablet Oral QHS   sevelamer carbonate  800 mg Oral TID WC   spironolactone  25 mg Oral Daily   Continuous Infusions:  azithromycin 500 mg (06/12/21 0812)   cefTRIAXone (ROCEPHIN)  IV 2 g (06/12/21 1411)     LOS: 3 days   Time spent: 34mn  Kama Cammarano C Paquita Printy, DO [facility]  If 7PM-7AM, please contact night-coverage [email or web address]  06/12/2021, 4:57 PM

## 2021-06-13 ENCOUNTER — Encounter: Payer: Self-pay | Admitting: Internal Medicine

## 2021-06-13 DIAGNOSIS — G9341 Metabolic encephalopathy: Secondary | ICD-10-CM | POA: Diagnosis not present

## 2021-06-13 LAB — BASIC METABOLIC PANEL
Anion gap: 11 (ref 5–15)
BUN: 30 mg/dL — ABNORMAL HIGH (ref 6–20)
CO2: 28 mmol/L (ref 22–32)
Calcium: 8.6 mg/dL — ABNORMAL LOW (ref 8.9–10.3)
Chloride: 95 mmol/L — ABNORMAL LOW (ref 98–111)
Creatinine, Ser: 3.56 mg/dL — ABNORMAL HIGH (ref 0.61–1.24)
GFR, Estimated: 19 mL/min — ABNORMAL LOW (ref >60)
Glucose, Bld: 156 mg/dL — ABNORMAL HIGH (ref 70–99)
Potassium: 3.7 mmol/L (ref 3.5–5.1)
Sodium: 134 mmol/L — ABNORMAL LOW (ref 135–145)

## 2021-06-13 LAB — GLUCOSE, CAPILLARY
Glucose-Capillary: 166 mg/dL — ABNORMAL HIGH (ref 70–99)
Glucose-Capillary: 144 mg/dL — ABNORMAL HIGH (ref 70–99)
Glucose-Capillary: 229 mg/dL — ABNORMAL HIGH (ref 70–99)
Glucose-Capillary: 242 mg/dL — ABNORMAL HIGH (ref 70–99)

## 2021-06-13 LAB — CBC
HCT: 24.9 % — ABNORMAL LOW (ref 39.0–52.0)
Hemoglobin: 7.6 g/dL — ABNORMAL LOW (ref 13.0–17.0)
MCH: 27.9 pg (ref 26.0–34.0)
MCHC: 30.5 g/dL (ref 30.0–36.0)
MCV: 91.5 fL (ref 80.0–100.0)
Platelets: 151 10*3/uL (ref 150–400)
RBC: 2.72 MIL/uL — ABNORMAL LOW (ref 4.22–5.81)
RDW: 19.4 % — ABNORMAL HIGH (ref 11.5–15.5)
WBC: 6.6 10*3/uL (ref 4.0–10.5)
nRBC: 0 % (ref 0.0–0.2)

## 2021-06-13 NOTE — TOC Progression Note (Addendum)
Transition of Care [facility]) - Progression Note    Patient Details  Name: [name] MRN: [id number] Date of Birth: 30-Sep-1961  Transition of Care [facility]) CM/SW Contact  Izola Price, RN Phone Number: 06/13/2021, 1:08 PM  Clinical Narrative: Admitted 06/09/21 from [facility]. Provider notes indicate low blood sugar due to accidental insulin administration. TOC consult on 06/10/21. No notes evident for disposition. Medically ready to return to SNF today per provider. Patient being discharged today back to [facility]. Left VM for Judithann Sheen, the [facility] regarding returning to facility today. Dominica Severin RN CM   Called back and was able to speak to [place], she is checking with [facility] for re-admission.  Dominica Severin RN CM 130 pm.        Expected Discharge Plan and Services           Expected Discharge Date: 06/13/21                                     Social Determinants of Health (SDOH) Interventions    Readmission Risk Interventions Readmission Risk Prevention Plan 02/10/2021 02/08/2021 11/21/2020  Transportation Screening Complete Complete Complete  PCP or Specialist Appt within 5-7 Days - Complete -  Home Care Screening - Complete -  Medication Review (RN CM) - Complete -  HRI or Home Care Consult Complete - -  Palliative Care Screening Complete - -  Medication Review (RN Care Manager) Complete - -  Some recent data might be hidden

## 2021-06-13 NOTE — TOC Transition Note (Signed)
Transition of Care [facility]) - CM/SW Discharge Note   Patient Details  Name: [name] MRN: [id number] Date of Birth: 12/17/1960  Transition of Care [facility]) CM/SW Contact:  Izola Price, RN Phone Number: 06/13/2021, 3:10 PM   Clinical Narrative:  Patient being discharged back to [facility] per Judithann Sheen, accepting. Hassan Rowan RN at [phone] accepting RN. [facility] transport will be arranged. DC Summary received by Darliss Ridgel. CarmenMarlon Spouse notified of transfer and stated patient is also aware. Simmie Davies RN CM     Final next level of care: [place] Barriers to Discharge: Barriers Resolved   Patient Goals and CMS Choice     Choice offered to / list presented to :  (Returning to [facility])  Discharge Placement              Patient chooses bed at: [facility] Patient to be transferred to facility by: [facility] Name of family member notified: Gilad Dugger Patient and family notified of of transfer: 06/13/21  Discharge Plan and Services                DME Arranged: N/A DME Agency: NA       HH Arranged: NA [place] Agency: NA        Social Determinants of Health ([place]) Interventions     Readmission Risk Interventions Readmission Risk Prevention Plan 02/10/2021 02/08/2021 11/21/2020  Transportation Screening Complete Complete Complete  PCP or Specialist Appt within 5-7 Days - Complete -  Home Care Screening - Complete -  Medication Review (RN CM) - Complete -  HRI or Home Care Consult Complete - -  Palliative Care Screening Complete - -  Medication Review (RN Care Manager) Complete - -  Some recent data might be hidden

## 2021-06-13 NOTE — Evaluation (Signed)
Physical Therapy Evaluation Patient Details Name: [name] MRN: [id number] DOB: 1961-08-05 Today's Date: 06/13/2021   History of Present Illness  60 y.o. black male with ESRD on hemodialysis, DM type II, hypertension, CVA, peripheral vascular disease status post bilateral BKA and ESRD on dialysis. Patient presented to ED with hypoglycemic episode, unknown source. Had his BS stabilized and is referred to PT for check of mobility.  Clinical Impression  Pt was admitted from SNF and is anticipated to return there for follow up and completion of rehab to get home.  He is unable to don his prosthetics, likely from not wearing them and/or shrinkers since admission.  Follow him as his stay permits to get his strength and tolerance for mobility back to PLOF.  SNF is needed since wife must assist him alone at home and will need a greater level of independence for safety.      Follow Up Recommendations SNF    Equipment Recommendations  None recommended by PT    Recommendations for Other Services       Precautions / Restrictions Precautions Precautions: Fall Precaution Comments: has prostheses that do not fit Other Brace: pt has BLE prosthetics Restrictions Weight Bearing Restrictions: Yes RLE Weight Bearing: Weight bearing as tolerated LLE Weight Bearing: Weight bearing as tolerated Other Position/Activity Restrictions: requires B prosthetics to stand      Mobility  Bed Mobility Overal bed mobility: Needs Assistance Bed Mobility: Supine to Sit;Sit to Supine     Supine to sit: Min guard Sit to supine: Min guard   General bed mobility comments: pt was able to assist transition in and out of bed but needed help to don prosthetics partially    Transfers Overall transfer level: Needs assistance Equipment used: 1 person hand held assist Transfers: Lateral/Scoot Transfers          Lateral/Scoot Transfers: Min guard;Min assist General transfer comment: pt is able to scoot up  the bed and then up to [facility] from inside bed  Ambulation/Gait             General Gait Details: transfers only baseline  Stairs            Wheelchair Mobility    Modified Rankin (Stroke Patients Only)       Balance Overall balance assessment: Needs assistance Sitting-balance support: Single extremity supported Sitting balance-Leahy Scale: Fair         Standing balance comment: could not don legs to stand                             Pertinent Vitals/Pain Pain Assessment: No/denies pain    Home Living Family/patient expects to be discharged to:: Skilled nursing facility Living Arrangements: Spouse/significant other Available Help at Discharge: Family Type of Home: House Home Access: Ramped entrance     Home Layout: One level Home Equipment: Tub bench;Walker - 2 wheels;Crutches;Bedside commode;Wheelchair - manual Additional Comments: in SNF for now    Prior Function Level of Independence: Needs assistance   Gait / Transfers Assistance Needed: pivots to chair and back with prosthetics and assistance  ADL's / Homemaking Assistance Needed: wife was assisting his care and even feeding prior to SNF        Hand Dominance   Dominant Hand: Right    Extremity/Trunk Assessment   Upper Extremity Assessment Upper Extremity Assessment: Overall WFL for tasks assessed    Lower Extremity Assessment Lower Extremity Assessment:  (B BK amputations)  Cervical / Trunk Assessment Cervical / Trunk Assessment: Normal  Communication   Communication: No difficulties  Cognition Arousal/Alertness: Awake/alert Behavior During Therapy: WFL for tasks assessed/performed Overall Cognitive Status: Within Functional Limits for tasks assessed                                 General Comments: completely following PT instructions, did not know his prostheses did not fit      General Comments General comments (skin integrity, edema, etc.): pt is  able to assist with transition to side of bed and back, did not have prosthesis support to get to [facility] but is able to scoot with level shifting surface with min guard    Exercises     Assessment/Plan    PT Assessment Patient needs continued PT services  PT Problem List Decreased strength;Decreased activity tolerance;Decreased balance;Decreased mobility       PT Treatment Interventions DME instruction;Functional mobility training;Therapeutic activities;Therapeutic exercise;Balance training;Patient/family education;Wheelchair mobility training;Neuromuscular re-education    PT Goals (Current goals can be found in the Care Plan section)  Acute Rehab PT Goals Patient Stated Goal: to get prosthetics on and stand PT Goal Formulation: With patient Time For Goal Achievement: 06/26/21 Potential to Achieve Goals: Good    Frequency Min 2X/week   Barriers to discharge Decreased caregiver support home with wife    Co-evaluation               AM-PAC PT "6 Clicks" Mobility  Outcome Measure Help needed turning from your back to your side while in a flat bed without using bedrails?: None Help needed moving from lying on your back to sitting on the side of a flat bed without using bedrails?: A Little Help needed moving to and from a bed to a chair (including a wheelchair)?: A Little Help needed standing up from a chair using your arms (e.g., wheelchair or bedside chair)?: A Lot Help needed to walk in hospital room?: Total Help needed climbing 3-5 steps with a railing? : Total 6 Click Score: 14    End of Session Equipment Utilized During Treatment: Gait belt Activity Tolerance: Patient tolerated treatment well Patient left: in bed;with call bell/phone within reach;with bed alarm set Nurse Communication: Mobility status PT Visit Diagnosis: Muscle weakness (generalized) (M62.81);Difficulty in walking, not elsewhere classified (R26.2)    Time: 8887-5797 PT Time Calculation (min) (ACUTE  ONLY): 11 min   Charges:   PT Evaluation $PT Eval Moderate Complexity: 1 Mod         Ramond Dial 06/13/2021, 1:37 PM  Mee Hives, PT MS Acute Rehab Dept. Number: [place]

## 2021-06-13 NOTE — Progress Notes (Signed)
[facility]  PROGRESS NOTE   Subjective:   Patient seen at bedside. Feels much better today.  Ate well. Spoke to the patient's wife over the telephone. Hypoglycemic episodes resolved.  Objective:  Vital signs in last 24 hours:  Temp:  [98 F (36.7 C)-98.9 F (37.2 C)] 98.1 F (36.7 C) (08/27 1124) Pulse Rate:  [75-80] 77 (08/27 1124) Resp:  [16-18] 17 (08/27 1124) BP: (147-168)/(60-90) 156/60 (08/27 1124) SpO2:  [97 %-100 %] 98 % (08/27 1124)  Weight change:  Filed Weights   06/09/21 0654 06/10/21 0638  Weight: 60.1 kg 61.1 kg    Intake/Output: I/O last 3 completed shifts: In: 1260.7 [P.O.:390; IV Piggyback:870.7] Out: 1000 [Other:1000]   Intake/Output this shift:  Total I/O In: 490 [P.O.:240; IV Piggyback:250] Out: 300 [Urine:300]  Physical Exam: General:  No acute distress  Head:  Normocephalic, atraumatic. Moist oral mucosal membranes  Eyes:  Anicteric  Neck:  Supple  Lungs:   Clear to auscultation, normal effort  Heart:  S1S2 no rubs  Abdomen:   Soft, nontender, bowel sounds present  Extremities: Bilateral BKA.  Neurologic:  Awake, alert, following commands  Skin:  No lesions  Access:     Basic Metabolic Panel: Recent Labs  Lab 06/09/21 0626 06/10/21 0809 06/11/21 0538 06/12/21 0533 06/13/21 0627  NA 136 134* 135 136 134*  K 3.6 4.1 4.2 4.5 3.7  CL 95* 94* 93* 97* 95*  CO2 31 26 29 28 28   GLUCOSE 106* 171* 143* 125* 156*  BUN 27* 41* 32* 42* 30*  CREATININE 3.62* 4.72* 3.43* 4.49* 3.56*  CALCIUM 9.0 9.0 8.6* 8.6* 8.6*  MG 1.8  --   --   --   --     CBC: Recent Labs  Lab 06/09/21 0626 06/10/21 0809 06/11/21 0538 06/12/21 0533 06/13/21 0627  WBC 11.6* 9.3 7.9 6.5 6.6  NEUTROABS 10.6*  --   --   --   --   HGB 8.5* 9.2* 7.9* 7.8* 7.6*  HCT 28.4* 30.7* 25.7* 26.0* 24.9*  MCV 90.7 94.2 90.5 92.5 91.5  PLT 232 173 156 145* 151     Urinalysis: No results for input(s): COLORURINE, LABSPEC, PHURINE, GLUCOSEU, HGBUR,  BILIRUBINUR, KETONESUR, PROTEINUR, UROBILINOGEN, NITRITE, LEUKOCYTESUR in the last 72 hours.  Invalid input(s): APPERANCEUR    Imaging: No results found.   Medications:    cefTRIAXone (ROCEPHIN)  IV 2 g (06/13/21 1424)    aspirin  81 mg Oral Daily   atorvastatin  80 mg Oral Daily   carvedilol  25 mg Oral BID WC   Chlorhexidine Gluconate Cloth  6 each Topical Q0600   epoetin (EPOGEN/PROCRIT) injection  10,000 Units Intravenous Q M,W,F-HD   feeding supplement  237 mL Oral TID BM   feeding supplement (NEPRO CARB STEADY)  237 mL Oral BID BM   gabapentin  400 mg Oral QHS   heparin  5,000 Units Subcutaneous Q8H   hydrALAZINE  100 mg Oral Q8H   levETIRAcetam  250 mg Oral Q M,W,F-HD   levETIRAcetam  500 mg Oral Daily   lisinopril  40 mg Oral Daily   multivitamin  1 tablet Oral QHS   sevelamer carbonate  800 mg Oral TID WC   spironolactone  25 mg Oral Daily    Assessment/ Plan:     Principal Problem:   Acute metabolic encephalopathy Active Problems:   Hidradenitis suppurativa   Type 2 diabetes mellitus with hypoglycemia without coma (HCC)   End stage renal failure on dialysis (  [place])   History of CVA (cerebrovascular accident)   Seizure disorder (HCC)   S/P bilateral BKA (below knee amputation) ([place])   Cerebrovascular disease   Chronic systolic CHF (congestive heart failure) ([place])  60 year old male with history of hypertension, coronary artery disease, diabetes, congestive heart failure, peripheral vascular disease, s/p bilateral BKA, end-stage renal disease on dialysis now admitted with history of hypoglycemic episodes.  Blood sugars have improved significantly.  He had stable dialysis yesterday. Continue supportive care and is stable from renal standpoint at this time.   LOS: [place], MD [facility] 8/27/20222:32 PM

## 2021-06-13 NOTE — Progress Notes (Signed)
PT Cancellation Note  Patient Details Name: [name] MRN: [id number] DOB: 01-Aug-1961   Cancelled Treatment:    Reason Eval/Treat Not Completed: Other (comment).  Pt was just served a meal, retry when pt can allow.   Ramond Dial 06/13/2021, 9:07 AM  Mee Hives, PT MS Acute Rehab Dept. Number: [place]

## 2021-06-14 LAB — CULTURE, BLOOD (ROUTINE X 2)
Culture: NO GROWTH
Culture: NO GROWTH
Special Requests: ADEQUATE
Special Requests: ADEQUATE

## 2021-06-15 ENCOUNTER — Encounter (INDEPENDENT_AMBULATORY_CARE_PROVIDER_SITE_OTHER): Payer: Medicare Other | Admitting: Family Medicine

## 2021-06-15 DIAGNOSIS — I5022 Chronic systolic (congestive) heart failure: Secondary | ICD-10-CM | POA: Diagnosis not present

## 2021-06-15 DIAGNOSIS — E1122 Type 2 diabetes mellitus with diabetic chronic kidney disease: Secondary | ICD-10-CM | POA: Diagnosis not present

## 2021-06-15 DIAGNOSIS — N186 End stage renal disease: Secondary | ICD-10-CM

## 2021-06-15 DIAGNOSIS — I132 Hypertensive heart and chronic kidney disease with heart failure and with stage 5 chronic kidney disease, or end stage renal disease: Secondary | ICD-10-CM

## 2021-06-15 DIAGNOSIS — E8779 Other fluid overload: Secondary | ICD-10-CM

## 2021-06-15 DIAGNOSIS — E1165 Type 2 diabetes mellitus with hyperglycemia: Secondary | ICD-10-CM

## 2021-06-15 DIAGNOSIS — D631 Anemia in chronic kidney disease: Secondary | ICD-10-CM

## 2021-06-15 DIAGNOSIS — J189 Pneumonia, unspecified organism: Secondary | ICD-10-CM

## 2021-06-15 DIAGNOSIS — E785 Hyperlipidemia, unspecified: Secondary | ICD-10-CM

## 2021-06-15 DIAGNOSIS — G43909 Migraine, unspecified, not intractable, without status migrainosus: Secondary | ICD-10-CM

## 2021-06-15 NOTE — Patient Instructions (Addendum)
Thank you for allowing the Chronic Care Management team to participate in your care.    Patient Care Plan: ESRD     Problem Identified: Disease Progression      Long-Range Goal: Disease Progression Prevented or Minimized   Start Date: 06/12/2021  Expected End Date: 10/10/2021  Priority: High  Note:    Current Barriers:  Chronic Disease Management support and educational needs r/t End Stage Renal Disease in patient with Diabetes.   Goals: Over the next 120 days, patient will not require hospitalization or emergent care d/t complications r/t End Stage Renal Disease.  Interventions:  Collaboration with [name], MD regarding development and update of comprehensive plan of care as evidenced by provider attestation and co-signature Inter-disciplinary care team collaboration (see longitudinal plan of care) Reviewed current treatment plan. Currently attends hemodialysis on Monday, Wednesday and Friday. His condition is complicated by anemia. Reports requiring a blood transfusion two weeks ago. He is also scheduled for imaging and follow up with the vascular team due to complications/bleeding from his fistula.  Reviewed medications. Advised to administer as prescribed and adjust/hold medications when instructed by his Nephrologist. Encouraged to notify the care management team with concerns r/t medication management or prescription costs. Discussed nutritional intake and compliance with the recommended diet. Reports decreased appetite due to fatigue after dialysis treatments. He reports eating breakfast without difficulty Notices a decreased appetite for the remainder of the day. We discussed options for Nepro as a nutritional supplement, but he declined. He does reports eating a high protein breakfast and a small protein snack later in the day. His spouse in interested in options for an appetite stimulate if his appetite does not improve. Update on 06/12/21. Spoke with patient's  [employer]. Reports patient is currently hospitalized. Reports he was hospitalized from skilled nursing after experiencing unrelieved shortness of breath. She is unsure of anticipated discharge date but indicates patient will be transferred back to skilled nursing. She requested assistance with contacting the Palliative Care team to update needs. She agreed to contact the care management team with updates anticipated length of stay in SNF and possible in-home needs.   Self-Care Activities/Patient Goals: Administer medications as prescribed. Complete recommended labs and attend provider appointments as scheduled. Adhere to recommended high protein diet and fluid restrictions. Follow recommended safety precautions. Contact the provider or care management team with questions and new concerns as needed        Patient Care Plan: Fall Risk (Adult)     Problem Identified: Fall Risk      Long-Range Goal: Absence of Fall and Fall-Related Injury   Start Date: 06/12/2021  Expected End Date: 10/10/2021  Priority: High  Note:   Current Barriers:  High Risk for Falls r/t bilateral amputations below the knee. Unable to perform ADLs independently Unable to perform IADLs independently   Clinical Goal(s):  Over the next 120 days, patient will not experience falls or require emergent treatment d/t fall related injuries.  Interventions:  Collaboration with [name], MD regarding development and update of comprehensive plan of care as evidenced by provider attestation and co-signature Inter-disciplinary care team collaboration (see longitudinal plan of care) Reviewed information regarding safety and fall prevention. Assessed for recent falls. Reports falling twice within the past few weeks. One fall occurred when attempting to transfer from his chair to the shower bench. Reports another fall occurred when attempting to transfer out of bed. Denies injuries. Reports not informing  his provider to complete a follow-up evaluation. His  spouse indicates that he did not notify her when attempting to transfer and she was initially unaware of his fall. He denies concerns complaints of discomfort today. Discussed need to ensure assistance is available when attempting to transfer. He was strongly advised to avoid attempting to transfer when he feels weak due his high risk for falls.  Reviewed in home needs.  His spouse continues to serve as his primary care giver. Declined current need for additional assistance. Agreed to update the team if this changes and assistance is needed.  Update on 06/12/21: Spoke with patient's spouse/caregiver [name]. Reports he experienced fall while in the skilled nursing facility. He is currently hospitalized with anticipated discharge back to SNF. Agreed to update the care management team with in-home needs.   Self-Care Deficits/Patient Goals:  Utilize wheelchair and utilize proper transfer techniques Avoid attempting to transfer without assistance or when he feels weak. Keep pathways clear and well lit Avoid overreaching to prevent accidental wheelchair tilting Adhere to recommended safety precautions Update the care management team regarding change in functional status       [name] spouse/caregiver [name] verbalized understanding of the information discussed during the telephonic outreach today. Declined need for mailed/printed instructions. A member of the care management team will follow up with Mr. Montel or his spouse within the next month.   [facility] Care Management [facility] [phone]

## 2021-06-16 ENCOUNTER — Other Ambulatory Visit: Payer: Self-pay

## 2021-06-17 DIAGNOSIS — Z794 Long term (current) use of insulin: Secondary | ICD-10-CM

## 2021-06-17 DIAGNOSIS — Z992 Dependence on renal dialysis: Secondary | ICD-10-CM | POA: Diagnosis not present

## 2021-06-17 DIAGNOSIS — E1129 Type 2 diabetes mellitus with other diabetic kidney complication: Secondary | ICD-10-CM

## 2021-06-17 DIAGNOSIS — N186 End stage renal disease: Secondary | ICD-10-CM

## 2021-06-23 ENCOUNTER — Other Ambulatory Visit: Payer: Medicare Other | Admitting: Nurse Practitioner

## 2021-06-23 ENCOUNTER — Telehealth: Payer: Self-pay | Admitting: Nurse Practitioner

## 2021-06-23 ENCOUNTER — Other Ambulatory Visit: Payer: Self-pay

## 2021-06-23 NOTE — Telephone Encounter (Signed)
I called [name] to change PC in person visit today to telemedicine, Mrs. Wirz endorses Mr. Mccaughey is currently at [facility]. Mrs. Fout is in agreement to continue to have PC follow at [facility].

## 2021-06-29 ENCOUNTER — Other Ambulatory Visit (INDEPENDENT_AMBULATORY_CARE_PROVIDER_SITE_OTHER): Payer: Self-pay | Admitting: Nurse Practitioner

## 2021-06-29 DIAGNOSIS — N186 End stage renal disease: Secondary | ICD-10-CM

## 2021-06-30 ENCOUNTER — Ambulatory Visit (INDEPENDENT_AMBULATORY_CARE_PROVIDER_SITE_OTHER): Payer: Medicare Other | Admitting: Nurse Practitioner

## 2021-06-30 ENCOUNTER — Other Ambulatory Visit: Payer: Self-pay

## 2021-06-30 ENCOUNTER — Encounter (INDEPENDENT_AMBULATORY_CARE_PROVIDER_SITE_OTHER): Payer: Self-pay | Admitting: Nurse Practitioner

## 2021-06-30 ENCOUNTER — Ambulatory Visit (INDEPENDENT_AMBULATORY_CARE_PROVIDER_SITE_OTHER): Payer: Medicare Other

## 2021-06-30 VITALS — BP 155/71 | HR 71 | Resp 16

## 2021-06-30 DIAGNOSIS — N186 End stage renal disease: Secondary | ICD-10-CM | POA: Diagnosis not present

## 2021-06-30 DIAGNOSIS — E1129 Type 2 diabetes mellitus with other diabetic kidney complication: Secondary | ICD-10-CM

## 2021-06-30 DIAGNOSIS — Z794 Long term (current) use of insulin: Secondary | ICD-10-CM

## 2021-06-30 DIAGNOSIS — E7800 Pure hypercholesterolemia, unspecified: Secondary | ICD-10-CM | POA: Diagnosis not present

## 2021-07-01 NOTE — Progress Notes (Signed)
Cardiology Office Note    Date:  07/02/2021   ID:  [name], DOB 04/09/1961, MRN [id number]  PCP:  [name], MD  Cardiologist:  Nelva Bush, MD  Electrophysiologist:  None   Chief Complaint: Hospital follow-up  History of Present Illness:   [name] is a 60 y.o. male with history of HFrEF, ESRD on HD, CVA/intracranial hemorrhage, seizure disorder, transfusion dependent anemia of chronic disease, DVT, PAD status post bilateral BKA's, DM2, question of possible vascular dementia, HTN, HLD, and hidradenitis suppurativa who presents for hospital follow-up as detailed below.  Prior echo in 08/2020 demonstrated an EF of 40 to 45%, global hypokinesis, grade 2 diastolic dysfunction, elevated left atrial pressure, mildly reduced RV systolic function with normal ventricular cavity size, mild biatrial enlargement, trivial mitral regurgitation, mild aortic sclerosis without evidence of stenosis, and mild dilatation of the ascending aorta measuring 40 mm.  More recently, he was admitted to the hospital in 04/2021 with increased weakness and falls.  He was felt to be fluid overloaded by the hospitalist service and underwent hemodialysis.  He was also treated for questionable pulmonary infiltrate.  Echo was obtained at that time which demonstrated an EF of 30 to 35%, global hypokinesis, mild LVH, grade 2 diastolic dysfunction, elevated left atrial pressure, moderately reduced RV systolic function with moderately enlarged RV cavity size, normal PASP, severe left atrial enlargement, large pleural effusion in the left lateral region, mild to moderate mitral regurgitation with moderate mitral annular calcification, moderate tricuspid regurgitation, mild to moderate aortic valve sclerosis without evidence of stenosis, and an estimated right atrial pressure of 8 mmHg.  Aorta noted to be normal in size and structure.  He was readmitted in early 05/2021 with acute on chronic hypoxic respiratory  failure briefly requiring BiPAP felt to be secondary to CAP, pleural effusion, and exacerbated by worsening anemia.  Admission hemoglobin noted to be low at 5.8 requiring 1 unit pRBC transfusion.  Cardiology was consulted during this second admission for recommendations regarding the above echo.  With regards to his cardiomyopathy, he was not felt to be an ischemic candidate during his admission secondary to transfusion dependent anemia, and acute illness with recommendation to follow-up as an outpatient.  Cardiology recommended he be discharged on carvedilol, Isordil/hydralazine, and lisinopril.  He did undergo left-sided thoracentesis.  CTA chest negative for PE, though did show small bilateral pleural effusions and bilateral lower lobe airspace opacities.  Prior to discharge on 8/9, it appears the patient was initiated on spironolactone by outside group, with medication being continued in the subsequent hospitalization.  He was readmitted in late 05/2021 with mental status change, hypoxia, and hypoglycemia.  CT head showed no acute intracranial pathology with prior noted right MCA CVA.  CTA chest showed bilateral pleural effusions and moderate pulmonary interstitial and airspace edema that had increased from prior exam.  There were also new multifocal airspace opacities noted and diffuse body wall edema.  For remaining details of hospitalization, please see discharge summary.  Throughout all of the above hospitalizations, his high-sensitivity troponin was minimally elevated and flat trending, peaking at 38.  He comes in today, initially accompanied by his facility, though subsequently joined by his wife.  He is doing well from a cardiac perspective and denies symptoms of angina, dyspnea, palpitations, dizziness, presyncope, or syncope.  No stump swelling.  He indicates nephrology typically does not have to remove much fluid.  His appetite does remain poor.  He reports his weight has been overall  stable.  He  is uncertain of which medications he is taking, though does indicate he is tolerating them.  He denies any falls, hematochezia, or melena.  He prefers a more conservative approach at this time.   Labs independently reviewed: 05/2021 - potassium 3.7, BUN 30, serum creatinine 3.56, Hgb 7.6, PLT 151, magnesium 1.8, albumin 2.8, AST/ALT normal 08/2020 - TC 109, TG 90, HDL 40, LDL 51 08/2017 - A1c 5.4  Past Medical History:  Diagnosis Date   Acute metabolic encephalopathy 5/0/9326   Anemia    Crohn disease ([place])    Diabetes mellitus without complication ([place])    DVT of lower extremity (deep venous thrombosis) ([place]) 2016   Empyema ([place]) 05/20/2017   Encephalopathy 12/04/2017   Fall at home, initial encounter 09/12/2020   Hidradenitis suppurativa    Hypertension    ICH (intracerebral hemorrhage) ([place])    Peritonitis ([place]) 04/21/2017   Pyogenic arthritis of knee ([place]) 02/04/2016   Renal disorder    Sepsis ([place]) 01/12/2018   Stroke [facility])     Past Surgical History:  Procedure Laterality Date   A/V FISTULAGRAM Left 02/24/2021   Procedure: A/V FISTULAGRAM;  Surgeon: Katha Cabal, MD;  Location: [place] [facility];  Service: Cardiovascular;  Laterality: Left;   ABDOMINAL SURGERY     AMPUTATION FINGER Left 06/2019   PR AMPUTATION LONG FINGER/THUMB+FLAPS [facility]   ANGIOPLASTY Left    left fem-pop at [facility] 04-11-2018   BELOW KNEE LEG AMPUTATION Right 08/2017   [facility]   COLONOSCOPY     COLONOSCOPY WITH PROPOFOL N/A 10/28/2020   Procedure: COLONOSCOPY WITH PROPOFOL;  Surgeon: Lin Landsman, MD;  Location: [place];  Service: Gastroenterology;  Laterality: N/A;   COLONOSCOPY WITH PROPOFOL N/A 11/21/2020   Procedure: COLONOSCOPY WITH PROPOFOL;  Surgeon: [name], MD;  Location: [facility] ENDOSCOPY;  Service: Endoscopy;  Laterality: N/A;   DIALYSIS/PERMA CATHETER INSERTION N/A 12/09/2017   Procedure: DIALYSIS/PERMA CATHETER INSERTION;  Surgeon: Katha Cabal, MD;  Location: [place] [facility];  Service: Cardiovascular;  Laterality: N/A;   DIALYSIS/PERMA CATHETER INSERTION N/A 12/12/2017   Procedure: DIALYSIS/PERMA CATHETER INSERTION;  Surgeon: Algernon Huxley, MD;  Location: [place] [facility];  Service: Cardiovascular;  Laterality: N/A;   DIALYSIS/PERMA CATHETER REMOVAL Left 12/09/2017   Procedure: DIALYSIS/PERMA CATHETER REMOVAL;  Surgeon: Katha Cabal, MD;  Location: [place] [facility];  Service: Cardiovascular;  Laterality: Left;   ESOPHAGOGASTRODUODENOSCOPY (EGD) WITH PROPOFOL N/A 11/20/2020   Procedure: ESOPHAGOGASTRODUODENOSCOPY (EGD) WITH PROPOFOL;  Surgeon: [name], MD;  Location: [facility] ENDOSCOPY;  Service: Endoscopy;  Laterality: N/A;   KNEE SURGERY Left 02/04/2016   [facility]   LEG AMPUTATION THROUGH LOWER TIBIA AND FIBULA Left 06/22/2018   [facility]   LOWER EXTREMITY ANGIOGRAPHY Right 08/08/2017   Procedure: Lower Extremity Angiography;  Surgeon: Algernon Huxley, MD;  Location: [place] [facility];  Service: Cardiovascular;  Laterality: Right;   LOWER EXTREMITY ANGIOGRAPHY Right 08/22/2017   Procedure: Lower Extremity Angiography;  Surgeon: Algernon Huxley, MD;  Location: [place] [facility];  Service: Cardiovascular;  Laterality: Right;   LOWER EXTREMITY INTERVENTION  08/08/2017   Procedure: LOWER EXTREMITY INTERVENTION;  Surgeon: Algernon Huxley, MD;  Location: [place] [facility];  Service: Cardiovascular;;   LOWER EXTREMITY INTERVENTION  08/22/2017   Procedure: LOWER EXTREMITY INTERVENTION;  Surgeon: Algernon Huxley, MD;  Location: [place] [facility];  Service: Cardiovascular;;    Current Medications: Current Meds  Medication Sig   acetaminophen (TYLENOL)  500 MG tablet Take 1,000 mg by mouth daily as needed for moderate pain or headache.    Adalimumab ([name] PEN) 40 MG/0.4ML PNKT Inject 40 mg into the muscle once a week.    Alcohol Swabs PADS Use as directed to check blood sugar three times daily for insulin dependent type 2 diabetes.   aspirin 81 MG chewable tablet Chew  81 mg by mouth daily.   atorvastatin (LIPITOR) 80 MG tablet TAKE 1 TABLET(80 MG) BY MOUTH DAILY   Blood Glucose Monitoring Suppl (ONE TOUCH ULTRA 2) w/Device KIT Use as directed to check blood sugar three times daily. E11.9   carvedilol (COREG) 25 MG tablet Take 1 tablet (25 mg total) by mouth 2 (two) times daily.   feeding supplement (ENSURE ENLIVE / ENSURE PLUS) LIQD Take 237 mLs by mouth 3 (three) times daily between meals.   gabapentin (NEURONTIN) 400 MG capsule Take 400 mg by mouth at bedtime.   hydrALAZINE (APRESOLINE) 100 MG tablet Take 1 tablet (100 mg total) by mouth every 8 (eight) hours.   Insulin Aspart FlexPen (NOVOLOG) 100 UNIT/ML Inject into the skin. Per sliding scale   isosorbide mononitrate (IMDUR) 30 MG 24 hr tablet Take 1 tablet (30 mg total) by mouth daily.   levETIRAcetam (KEPPRA) 250 MG tablet Take 250 mg by mouth daily as needed.   levETIRAcetam (KEPPRA) 500 MG tablet Take 1 tablet (500 mg total) by mouth daily.   lisinopril (ZESTRIL) 40 MG tablet Take 40 mg by mouth daily.   Multiple Vitamin (THEREMS PO) Take 1 tablet by mouth daily. multivitamin   sevelamer carbonate (RENVELA) 800 MG tablet TAKE 1 TABLET(800 MG) BY MOUTH THREE TIMES DAILY   [DISCONTINUED] spironolactone (ALDACTONE) 25 MG tablet Take 1 tablet by mouth at bedtime.    Allergies:   Methotrexate, Vancomycin, Cefepime, and Tape   Social History   Socioeconomic History   Marital status: Married    Spouse name: Not on file   Number of children: 2   Years of education: Not on file   Highest education level: Associate degree: occupational, Hotel manager, or vocational program  Occupational History   Occupation: disable  Tobacco Use   Smoking status: Former   Smokeless tobacco: Never   Tobacco comments:    smokes marijuana  Vaping Use   Vaping Use: Never used  Substance and Sexual Activity   Alcohol use: No   Drug use: Yes    Types: Marijuana   Sexual activity: Not Currently  Other Topics Concern    Not on file  Social History Narrative   Not on file   Social Determinants of Health   Financial Resource Strain: Not on file  Food Insecurity: No Food Insecurity   Worried About Running Out of Food in the Last Year: Never true   [place] in the Last Year: Never true  Transportation Needs: No Transportation Needs   Lack of Transportation (Medical): No   Lack of Transportation (Non-Medical): No  Physical Activity: Not on file  Stress: Not on file  Social Connections: Not on file     Family History:  The patient's family history includes Arthritis in his brother; Diabetes in his mother, sister, sister, and sister; Heart disease in his father and mother; Irritable bowel syndrome in his sister; Psoriasis in his brother; Rheumatic fever in his father.  ROS:   Review of Systems  Constitutional:  Positive for malaise/fatigue. Negative for chills, diaphoresis, fever and weight loss.  Diminished appetite  HENT:  Negative for congestion.   Eyes:  Negative for discharge and redness.  Respiratory:  Negative for cough, hemoptysis, sputum production, shortness of breath and wheezing.   Cardiovascular:  Negative for chest pain, palpitations, orthopnea, claudication, leg swelling and PND.  Gastrointestinal:  Negative for abdominal pain, blood in stool, heartburn, melena, nausea and vomiting.  Musculoskeletal:  Negative for falls and myalgias.  Skin:  Negative for rash.  Neurological:  Positive for weakness. Negative for dizziness, tingling, tremors, sensory change, speech change, focal weakness and loss of consciousness.  Endo/Heme/Allergies:  Does not bruise/bleed easily.  Psychiatric/Behavioral:  Negative for substance abuse. The patient is not nervous/anxious.   All other systems reviewed and are negative.   EKGs/Labs/Other Studies Reviewed:    Studies reviewed were summarized above. The additional studies were reviewed today:  2D echo 04/2021: 1. Left ventricular ejection  fraction, by estimation, is 30 to 35%. The  left ventricle has moderately decreased function. The left ventricle  demonstrates global hypokinesis. The left ventricular internal cavity size  was mildly dilated. There is mild  left ventricular hypertrophy. Left ventricular diastolic parameters are  consistent with Grade II diastolic dysfunction (pseudonormalization).  Elevated left atrial pressure.   2. Right ventricular systolic function is moderately reduced. The right  ventricular size is moderately enlarged. There is normal pulmonary artery  systolic pressure.   3. Left atrial size was severely dilated.   4. Large pleural effusion in the left lateral region.   5. The mitral valve is abnormal. Mild to moderate mitral valve  regurgitation. No evidence of mitral stenosis. Moderate mitral annular  calcification.   6. Tricuspid valve regurgitation is moderate.   7. The aortic valve is tricuspid. There is moderate calcification of the  aortic valve. There is moderate thickening of the aortic valve. Aortic  valve regurgitation is not visualized. Mild to moderate aortic valve  sclerosis/calcification is present,  without any evidence of aortic stenosis.   8. The inferior vena cava is normal in size with <50% respiratory  variability, suggesting right atrial pressure of 8 mmHg.  __________  2D echo 08/2020: 1. Left ventricular ejection fraction, by estimation, is 40 to 45%. The  left ventricle has mildly decreased function. The left ventricle  demonstrates global hypokinesis. There is moderate concentric left  ventricular hypertrophy. Left ventricular  diastolic parameters are consistent with Grade II diastolic dysfunction  (pseudonormalization). Elevated left atrial pressure.   2. Right ventricular systolic function is mildly reduced. The right  ventricular size is normal. Tricuspid regurgitation signal is inadequate  for assessing PA pressure.   3. Left atrial size was mildly dilated.    4. Right atrial size was mildly dilated.   5. The mitral valve is normal in structure. Trivial mitral valve  regurgitation.   6. The aortic valve is tricuspid. There is mild calcification of the  aortic valve. There is mild thickening of the aortic valve. Aortic valve  regurgitation is not visualized. Mild aortic valve sclerosis is present,  with no evidence of aortic valve  stenosis.   7. Aortic dilatation noted. There is mild dilatation of the ascending  aorta, measuring 40 mm.   8. The inferior vena cava is normal in size with <50% respiratory  variability, suggesting right atrial pressure of 8 mmHg.     EKG:  EKG is ordered today.  The EKG ordered today demonstrates NSR, 75 bpm, LVH with early repolarization abnormality, nonspecific ST-T changes, baseline artifact, when compared to prior tracing  ST-T changes are improved  Recent Labs: 05/11/2021: TSH 2.280 05/18/2021: B Natriuretic Peptide >4,500.0 06/09/2021: ALT 13; Magnesium 1.8 06/13/2021: BUN 30; Creatinine, Ser 3.56; Hemoglobin 7.6; Platelets 151; Potassium 3.7; Sodium 134  Recent Lipid Panel    Component Value Date/Time   CHOL 109 09/13/2020 0325   CHOL 134 05/27/2020 1006   TRIG 90 09/13/2020 0325   HDL 40 (L) 09/13/2020 0325   HDL 62 05/27/2020 1006   CHOLHDL 2.7 09/13/2020 0325   VLDL 18 09/13/2020 0325   LDLCALC 51 09/13/2020 0325   LDLCALC 56 05/27/2020 1006    PHYSICAL EXAM:    VS:  BP (!) 170/70 (BP Location: Right Arm, Patient Position: Sitting, Cuff Size: Normal)   Pulse 75   Ht 5' 6"  (1.676 m)   Wt 147 lb (66.7 kg)   SpO2 97%   BMI 23.73 kg/m   BMI: Body mass index is 23.73 kg/m.  Physical Exam Vitals reviewed.  Constitutional:      Appearance: He is well-developed.  HENT:     Head: Normocephalic and atraumatic.  Eyes:     General:        Right eye: No discharge.        Left eye: No discharge.  Neck:     Vascular: No JVD.  Cardiovascular:     Rate and Rhythm: Normal rate and regular  rhythm.     Heart sounds: S1 normal and S2 normal. Heart sounds not distant. No midsystolic click and no opening snap. Murmur heard.  Systolic murmur is present with a grade of 1/6 at the lower left sternal border.    No friction rub.  Pulmonary:     Effort: Pulmonary effort is normal. No respiratory distress.     Breath sounds: Normal breath sounds. No decreased breath sounds, wheezing or rales.  Chest:     Chest wall: No tenderness.  Abdominal:     General: There is no distension.     Palpations: Abdomen is soft.     Tenderness: There is no abdominal tenderness.  Musculoskeletal:     Cervical back: Normal range of motion.     Comments: Status post bilateral BKA with no stump or thigh swelling.  Skin:    General: Skin is warm and dry.     Nails: There is no clubbing.  Neurological:     Mental Status: He is alert and oriented to person, place, and time.  Psychiatric:        Speech: Speech normal.        Behavior: Behavior normal.        Thought Content: Thought content normal.        Judgment: Judgment normal.    Wt Readings from Last 3 Encounters:  07/02/21 147 lb (66.7 kg)  06/10/21 134 lb 11.2 oz (61.1 kg)  05/26/21 132 lb 7.9 oz (60.1 kg)     ASSESSMENT & PLAN:   HFrEF: Uncertain chronicity and etiology at this time.  He appears euvolemic and well compensated.  Escalate GDMT with the addition of isosorbide with continuation of hydralazine, carvedilol, and lisinopril.  Given ESRD status, spironolactone will be discontinued.  Lengthy discussion with patient and wife regarding potential ischemic testing modalities including invasive and noninvasive.  We do continue to have concerns regarding potential cardiac cath given his significant comorbid conditions including transfusion dependent anemia with a current hemoglobin of 7.6, prior cachexia, question of vascular dementia, and recent repeat hospital hospital admissions.  Patient and family prefer to manage  medically at this time  with optimization of GDMT and defer invasive and noninvasive ischemic testing.  Not currently on ARNI or SGLT2 I in the setting of ESRD.  Discontinue MRA secondary to ESRD as outlined above.  Concern for possible prior history of significant alcohol use, patient denies at this time.  Elevated high-sensitivity troponin: Minimal elevation of high-sensitivity troponin that was peaking at 38 and flat trending throughout his multiple hospitalizations, not consistent with ACS.  Likely supply demand ischemia in the setting of ESRD, transfusion dependent anemia with a presenting hemoglobin of 5.8, and pneumonia.  No symptoms concerning for angina.  He remains on ASA, atorvastatin, carvedilol, and lisinopril.  ESRD on HD: Per nephrology.  Discontinue spironolactone to minimize risk of significant hyperkalemia.  Anemia of chronic disease: Previously required transfusion.  Management per PCP.  HTN: Blood pressure is mildly elevated in the office today, though we will allow for some degree of permissive hypertension given ESRD on HD status.  Add isosorbide as above.  Otherwise, he will continue current medications.  Disposition: F/u with Dr. Saunders Revel or an APP in 1 month.   Medication Adjustments/Labs and Tests Ordered: Current medicines are reviewed at length with the patient today.  Concerns regarding medicines are outlined above. Medication changes, Labs and Tests ordered today are summarized above and listed in the Patient Instructions accessible in Encounters.   Signed, Christell Faith, PA-C 07/02/2021 12:39 PM     [address] [place], [place] [address] [phone]

## 2021-07-02 ENCOUNTER — Ambulatory Visit (INDEPENDENT_AMBULATORY_CARE_PROVIDER_SITE_OTHER): Payer: Medicare Other | Admitting: Physician Assistant

## 2021-07-02 ENCOUNTER — Other Ambulatory Visit: Payer: Self-pay

## 2021-07-02 ENCOUNTER — Encounter: Payer: Self-pay | Admitting: Physician Assistant

## 2021-07-02 VITALS — BP 170/70 | HR 75 | Ht 66.0 in | Wt 147.0 lb

## 2021-07-02 DIAGNOSIS — N186 End stage renal disease: Secondary | ICD-10-CM | POA: Diagnosis not present

## 2021-07-02 DIAGNOSIS — I10 Essential (primary) hypertension: Secondary | ICD-10-CM

## 2021-07-02 DIAGNOSIS — Z992 Dependence on renal dialysis: Secondary | ICD-10-CM

## 2021-07-02 DIAGNOSIS — I5022 Chronic systolic (congestive) heart failure: Secondary | ICD-10-CM

## 2021-07-02 DIAGNOSIS — I248 Other forms of acute ischemic heart disease: Secondary | ICD-10-CM

## 2021-07-02 DIAGNOSIS — D638 Anemia in other chronic diseases classified elsewhere: Secondary | ICD-10-CM

## 2021-07-02 MED ORDER — ISOSORBIDE MONONITRATE ER 30 MG PO TB24: 30.0000 mg | ORAL_TABLET | Freq: Every day | ORAL | 2 refills | Status: DC

## 2021-07-02 NOTE — Patient Instructions (Signed)
Medication Instructions:   Your physician has recommended you make the following change in your medication:   STOP Spironolactone  START Isosorbide Mononitrate (Imdur) 30 mg DAILY   *If you need a refill on your cardiac medications before your next appointment, please call your pharmacy*   Lab Work:  None order   Testing/Procedures:  None order   Follow-Up: At [facility], you and your health needs are our priority.  As part of our continuing mission to provide you with exceptional heart care, we have created designated Provider Care Teams.  These Care Teams include your primary Cardiologist (physician) and Advanced Practice Providers (APPs -  Physician Assistants and Nurse Practitioners) who all work together to provide you with the care you need, when you need it.  We recommend signing up for the patient portal called "MyChart".  Sign up information is provided on this After Visit Summary.  MyChart is used to connect with patients for Virtual Visits (Telemedicine).  Patients are able to view lab/test results, encounter notes, upcoming appointments, etc.  Non-urgent messages can be sent to your provider as well.   To learn more about what you can do with MyChart, go to [email or web address].    Your next appointment:   1 month(s)  The format for your next appointment:   In Person  Provider:   You may see Christell Faith, PA-C

## 2021-07-05 NOTE — Progress Notes (Signed)
Subjective:    Patient ID: [name], male    DOB: July 10, 1961, 60 y.o.   MRN: [id number] Chief Complaint  Patient presents with   Follow-up    Ultrasound follow up    [name] is a 60 year old male that returns to the office for followup of their dialysis access. The function of the access has been stable. The patient denies increased bleeding time or increased recirculation. Patient denies difficulty with cannulation. The patient denies hand pain or other symptoms consistent with steal phenomena.  No significant arm swelling.  The patient denies redness or swelling at the access site. The patient denies fever or chills at home or while on dialysis.  The patient denies amaurosis fugax or recent TIA symptoms. There are no recent neurological changes noted. The patient denies claudication symptoms or rest pain symptoms. The patient denies history of DVT, PE or superficial thrombophlebitis. The patient denies recent episodes of angina or shortness of breath.   The patient has a flow volume of 1631.  The left brachiocephalic AV fistula has no evidence of significant stenosis.  The aneurysmal areas are noted the same as previous with no significant change.        Review of Systems  Neurological:  Positive for weakness.  All other systems reviewed and are negative.     Objective:   Physical Exam Vitals reviewed.  HENT:     Head: Normocephalic.  Cardiovascular:     Rate and Rhythm: Normal rate.     Pulses:          Radial pulses are 2+ on the left side.     Arteriovenous access: Left arteriovenous access is present.    Comments: Left brachiocephalic AV fistula with good thrill and bruit Pulmonary:     Effort: Pulmonary effort is normal.  Skin:    General: Skin is warm and dry.  Neurological:     Mental Status: He is alert and oriented to person, place, and time.     Motor: Weakness present.     Gait: Gait abnormal.  Psychiatric:        Mood and Affect: Mood  normal.        Behavior: Behavior normal.        Thought Content: Thought content normal.        Judgment: Judgment normal.    BP (!) 155/71 (BP Location: Right Arm)   Pulse 71   Resp 16   Past Medical History:  Diagnosis Date   Acute metabolic encephalopathy 06/20/2354   Anemia    Crohn disease ([place])    Diabetes mellitus without complication ([place])    DVT of lower extremity (deep venous thrombosis) ([place]) 2016   Empyema ([place]) 05/20/2017   Encephalopathy 12/04/2017   Fall at home, initial encounter 09/12/2020   Hidradenitis suppurativa    Hypertension    ICH (intracerebral hemorrhage) ([place])    Peritonitis ([place]) 04/21/2017   Pyogenic arthritis of knee ([place]) 02/04/2016   Renal disorder    Sepsis ([place]) 01/12/2018   Stroke [facility])     Social History   Socioeconomic History   Marital status: Married    Spouse name: Not on file   Number of children: 2   Years of education: Not on file   Highest education level: Associate degree: occupational, Hotel manager, or vocational program  Occupational History   Occupation: disable  Tobacco Use   Smoking status: Former   Smokeless tobacco: Never   Tobacco comments:    smokes  marijuana  Vaping Use   Vaping Use: Never used  Substance and Sexual Activity   Alcohol use: No   Drug use: Yes    Types: Marijuana   Sexual activity: Not Currently  Other Topics Concern   Not on file  Social History Narrative   Not on file   Social Determinants of Health   Financial Resource Strain: Not on file  Food Insecurity: No Food Insecurity   Worried About Running Out of Food in the Last Year: Never true   Ran Out of Food in the Last Year: Never true  Transportation Needs: No Transportation Needs   Lack of Transportation (Medical): No   Lack of Transportation (Non-Medical): No  Physical Activity: Not on file  Stress: Not on file  Social Connections: Not on file  Intimate Partner Violence: Not on file    Past Surgical History:  Procedure Laterality  Date   A/V FISTULAGRAM Left 02/24/2021   Procedure: A/V FISTULAGRAM;  Surgeon: Katha Cabal, MD;  Location: [place] [facility];  Service: Cardiovascular;  Laterality: Left;   ABDOMINAL SURGERY     AMPUTATION FINGER Left 06/2019   PR AMPUTATION LONG FINGER/THUMB+FLAPS [facility]   ANGIOPLASTY Left    left fem-pop [facility] 04-11-2018   BELOW KNEE LEG AMPUTATION Right 08/2017   [facility]   COLONOSCOPY     COLONOSCOPY WITH PROPOFOL N/A 10/28/2020   Procedure: COLONOSCOPY WITH PROPOFOL;  Surgeon: Lin Landsman, MD;  Location: [place];  Service: Gastroenterology;  Laterality: N/A;   COLONOSCOPY WITH PROPOFOL N/A 11/21/2020   Procedure: COLONOSCOPY WITH PROPOFOL;  Surgeon: [name], MD;  Location: [facility];  Service: Endoscopy;  Laterality: N/A;   DIALYSIS/PERMA CATHETER INSERTION N/A 12/09/2017   Procedure: DIALYSIS/PERMA CATHETER INSERTION;  Surgeon: Katha Cabal, MD;  Location: [place] [facility];  Service: Cardiovascular;  Laterality: N/A;   DIALYSIS/PERMA CATHETER INSERTION N/A 12/12/2017   Procedure: DIALYSIS/PERMA CATHETER INSERTION;  Surgeon: Algernon Huxley, MD;  Location: [place] [facility];  Service: Cardiovascular;  Laterality: N/A;   DIALYSIS/PERMA CATHETER REMOVAL Left 12/09/2017   Procedure: DIALYSIS/PERMA CATHETER REMOVAL;  Surgeon: Katha Cabal, MD;  Location: [place] [facility];  Service: Cardiovascular;  Laterality: Left;   ESOPHAGOGASTRODUODENOSCOPY (EGD) WITH PROPOFOL N/A 11/20/2020   Procedure: ESOPHAGOGASTRODUODENOSCOPY (EGD) WITH PROPOFOL;  Surgeon: [name], MD;  Location: [facility] ENDOSCOPY;  Service: Endoscopy;  Laterality: N/A;   KNEE SURGERY Left 02/04/2016   [facility]   LEG AMPUTATION THROUGH LOWER TIBIA AND FIBULA Left 06/22/2018   [facility]   LOWER EXTREMITY ANGIOGRAPHY Right 08/08/2017   Procedure: Lower Extremity Angiography;  Surgeon: Algernon Huxley, MD;  Location: [place] [facility];  Service: Cardiovascular;  Laterality: Right;   LOWER EXTREMITY  ANGIOGRAPHY Right 08/22/2017   Procedure: Lower Extremity Angiography;  Surgeon: Algernon Huxley, MD;  Location: [place] [facility];  Service: Cardiovascular;  Laterality: Right;   LOWER EXTREMITY INTERVENTION  08/08/2017   Procedure: LOWER EXTREMITY INTERVENTION;  Surgeon: Algernon Huxley, MD;  Location: [place] [facility];  Service: Cardiovascular;;   LOWER EXTREMITY INTERVENTION  08/22/2017   Procedure: LOWER EXTREMITY INTERVENTION;  Surgeon: Algernon Huxley, MD;  Location: [place] [facility];  Service: Cardiovascular;;    Family History  Problem Relation Age of Onset   Irritable bowel syndrome Sister    Diabetes Sister    Heart disease Mother    Diabetes Mother    Heart disease Father    Rheumatic fever Father  as child   Psoriasis Brother    Arthritis Brother    Diabetes Sister    Diabetes Sister     Allergies  Allergen Reactions   Methotrexate Other (See Comments)    Blood count drops   Vancomycin Shortness Of Breath    Eyes watering, SOB, wheezing   Cefepime Other (See Comments)    Confusion    Tape     CBC Latest Ref Rng & Units 06/13/2021 06/12/2021 06/11/2021  WBC 4.0 - 10.5 K/uL 6.6 6.5 7.9  Hemoglobin 13.0 - 17.0 g/dL 7.6(L) 7.8(L) 7.9(L)  Hematocrit 39.0 - 52.0 % 24.9(L) 26.0(L) 25.7(L)  Platelets 150 - 400 K/uL 151 145(L) 156      CMP     Component Value Date/Time   NA 134 (L) 06/13/2021 0627   NA 140 05/11/2021 1628   NA 134 (L) 07/24/2014 1524   K 3.7 06/13/2021 0627   K 4.3 07/24/2014 1524   CL 95 (L) 06/13/2021 0627   CL 103 07/24/2014 1524   CO2 28 06/13/2021 0627   CO2 21 07/24/2014 1524   GLUCOSE 156 (H) 06/13/2021 0627   GLUCOSE 168 (H) 07/24/2014 1524   BUN 30 (H) 06/13/2021 0627   BUN 22 05/11/2021 1628   BUN 62 (H) 07/24/2014 1524   CREATININE 3.56 (H) 06/13/2021 0627   CREATININE 4.95 (H) 07/24/2014 1524   CALCIUM 8.6 (L) 06/13/2021 0627   CALCIUM 9.3 07/24/2014 1524   PROT 7.2 06/09/2021 0626   PROT 7.0 05/11/2021 1628    ALBUMIN 2.8 (L) 06/09/2021 0626   ALBUMIN 3.3 (L) 05/11/2021 1628   AST 22 06/09/2021 0626   ALT 13 06/09/2021 0626   ALKPHOS 74 06/09/2021 0626   BILITOT 0.4 06/09/2021 0626   BILITOT 0.5 05/11/2021 1628   GFRNONAA 19 (L) 06/13/2021 0627   GFRNONAA 13 (L) 07/24/2014 1524   GFRAA 14 (L) 05/27/2020 1006   GFRAA 16 (L) 07/24/2014 1524     No results found.     Assessment & Plan:   1. ESRD (end stage renal disease) ([place]) Recommend:  The patient is doing well and currently has adequate dialysis access. The patient's dialysis center is not reporting any access issues. Flow pattern is stable when compared to the prior ultrasound.  The patient should have a duplex ultrasound of the dialysis access in 6 months. The patient will follow-up with me in the office after each ultrasound    - VAS [place] (AVF, AVG); Future  2. Type 2 diabetes mellitus with other diabetic kidney complication, with long-term current use of insulin (HCC) Continue hypoglycemic medications as already ordered, these medications have been reviewed and there are no changes at this time.  Hgb A1C to be monitored as already arranged by primary service   3. Hypercholesteremia Continue statin as ordered and reviewed, no changes at this time    Current Outpatient Medications on File Prior to Visit  Medication Sig Dispense Refill   acetaminophen (TYLENOL) 500 MG tablet Take 1,000 mg by mouth daily as needed for moderate pain or headache.      Adalimumab ([name] PEN) 40 MG/0.4ML PNKT Inject 40 mg into the muscle once a week.      Alcohol Swabs PADS Use as directed to check blood sugar three times daily for insulin dependent type 2 diabetes. 100 each 12   aspirin 81 MG chewable tablet Chew 81 mg by mouth daily.     atorvastatin (LIPITOR) 80 MG tablet TAKE 1 TABLET(80  MG) BY MOUTH DAILY 90 tablet 3   Blood Glucose Monitoring Suppl (ONE TOUCH ULTRA 2) w/Device KIT Use as directed to check blood sugar  three times daily. E11.9 1 each 0   carvedilol (COREG) 25 MG tablet Take 1 tablet (25 mg total) by mouth 2 (two) times daily. 60 tablet 12   feeding supplement (ENSURE ENLIVE / ENSURE PLUS) LIQD Take 237 mLs by mouth 3 (three) times daily between meals. 237 mL 12   gabapentin (NEURONTIN) 400 MG capsule Take 400 mg by mouth at bedtime.     hydrALAZINE (APRESOLINE) 100 MG tablet Take 1 tablet (100 mg total) by mouth every 8 (eight) hours. 90 tablet 1   Insulin Aspart FlexPen (NOVOLOG) 100 UNIT/ML Inject into the skin. Per sliding scale     levETIRAcetam (KEPPRA) 250 MG tablet Take 250 mg by mouth daily as needed.     levETIRAcetam (KEPPRA) 500 MG tablet Take 1 tablet (500 mg total) by mouth daily. 90 tablet 0   lisinopril (ZESTRIL) 40 MG tablet Take 40 mg by mouth daily.     sevelamer carbonate (RENVELA) 800 MG tablet TAKE 1 TABLET(800 MG) BY MOUTH THREE TIMES DAILY 270 tablet 0   No current facility-administered medications on file prior to visit.    There are no Patient Instructions on file for this visit. No follow-ups on file.   Kris Hartmann, NP

## 2021-07-06 ENCOUNTER — Encounter (INDEPENDENT_AMBULATORY_CARE_PROVIDER_SITE_OTHER): Payer: Self-pay | Admitting: Nurse Practitioner

## 2021-07-07 LAB — ACID FAST CULTURE WITH REFLEXED SENSITIVITIES (MYCOBACTERIA): Acid Fast Culture: NEGATIVE

## 2021-07-27 ENCOUNTER — Emergency Department: Payer: Medicare Other

## 2021-07-27 ENCOUNTER — Other Ambulatory Visit: Payer: Self-pay

## 2021-07-27 ENCOUNTER — Inpatient Hospital Stay
Admission: EM | Admit: 2021-07-27 | Discharge: 2021-08-02 | DRG: 871 | Disposition: A | Discharge status: Skilled Nursing Facility | Payer: Medicare Other | Attending: Obstetrics and Gynecology | Admitting: Obstetrics and Gynecology

## 2021-07-27 ENCOUNTER — Encounter: Payer: Self-pay | Admitting: Internal Medicine

## 2021-07-27 DIAGNOSIS — Z89511 Acquired absence of right leg below knee: Secondary | ICD-10-CM | POA: Diagnosis not present

## 2021-07-27 DIAGNOSIS — J159 Unspecified bacterial pneumonia: Secondary | ICD-10-CM | POA: Diagnosis present

## 2021-07-27 DIAGNOSIS — Z794 Long term (current) use of insulin: Secondary | ICD-10-CM

## 2021-07-27 DIAGNOSIS — A419 Sepsis, unspecified organism: Principal | ICD-10-CM | POA: Diagnosis present

## 2021-07-27 DIAGNOSIS — K5090 Crohn's disease, unspecified, without complications: Secondary | ICD-10-CM | POA: Diagnosis present

## 2021-07-27 DIAGNOSIS — Z79899 Other long term (current) drug therapy: Secondary | ICD-10-CM

## 2021-07-27 DIAGNOSIS — E785 Hyperlipidemia, unspecified: Secondary | ICD-10-CM | POA: Diagnosis present

## 2021-07-27 DIAGNOSIS — Z8616 Personal history of COVID-19: Secondary | ICD-10-CM | POA: Diagnosis not present

## 2021-07-27 DIAGNOSIS — Z91048 Other nonmedicinal substance allergy status: Secondary | ICD-10-CM

## 2021-07-27 DIAGNOSIS — N2581 Secondary hyperparathyroidism of renal origin: Secondary | ICD-10-CM | POA: Diagnosis present

## 2021-07-27 DIAGNOSIS — N400 Enlarged prostate without lower urinary tract symptoms: Secondary | ICD-10-CM | POA: Diagnosis present

## 2021-07-27 DIAGNOSIS — N186 End stage renal disease: Secondary | ICD-10-CM | POA: Diagnosis present

## 2021-07-27 DIAGNOSIS — Z8249 Family history of ischemic heart disease and other diseases of the circulatory system: Secondary | ICD-10-CM

## 2021-07-27 DIAGNOSIS — G40909 Epilepsy, unspecified, not intractable, without status epilepticus: Secondary | ICD-10-CM | POA: Diagnosis present

## 2021-07-27 DIAGNOSIS — Z992 Dependence on renal dialysis: Secondary | ICD-10-CM | POA: Diagnosis not present

## 2021-07-27 DIAGNOSIS — Z7982 Long term (current) use of aspirin: Secondary | ICD-10-CM

## 2021-07-27 DIAGNOSIS — E7800 Pure hypercholesterolemia, unspecified: Secondary | ICD-10-CM | POA: Diagnosis present

## 2021-07-27 DIAGNOSIS — Z86718 Personal history of other venous thrombosis and embolism: Secondary | ICD-10-CM

## 2021-07-27 DIAGNOSIS — I69354 Hemiplegia and hemiparesis following cerebral infarction affecting left non-dominant side: Secondary | ICD-10-CM

## 2021-07-27 DIAGNOSIS — E162 Hypoglycemia, unspecified: Secondary | ICD-10-CM | POA: Diagnosis not present

## 2021-07-27 DIAGNOSIS — D631 Anemia in chronic kidney disease: Secondary | ICD-10-CM | POA: Diagnosis present

## 2021-07-27 DIAGNOSIS — Z833 Family history of diabetes mellitus: Secondary | ICD-10-CM

## 2021-07-27 DIAGNOSIS — G9341 Metabolic encephalopathy: Secondary | ICD-10-CM | POA: Diagnosis present

## 2021-07-27 DIAGNOSIS — R680 Hypothermia, not associated with low environmental temperature: Secondary | ICD-10-CM | POA: Diagnosis present

## 2021-07-27 DIAGNOSIS — J690 Pneumonitis due to inhalation of food and vomit: Secondary | ICD-10-CM | POA: Diagnosis present

## 2021-07-27 DIAGNOSIS — Z89022 Acquired absence of left finger(s): Secondary | ICD-10-CM

## 2021-07-27 DIAGNOSIS — R042 Hemoptysis: Secondary | ICD-10-CM

## 2021-07-27 DIAGNOSIS — I5022 Chronic systolic (congestive) heart failure: Secondary | ICD-10-CM | POA: Diagnosis present

## 2021-07-27 DIAGNOSIS — E1151 Type 2 diabetes mellitus with diabetic peripheral angiopathy without gangrene: Secondary | ICD-10-CM | POA: Diagnosis present

## 2021-07-27 DIAGNOSIS — R040 Epistaxis: Secondary | ICD-10-CM | POA: Diagnosis not present

## 2021-07-27 DIAGNOSIS — Z881 Allergy status to other antibiotic agents status: Secondary | ICD-10-CM

## 2021-07-27 DIAGNOSIS — R6520 Severe sepsis without septic shock: Secondary | ICD-10-CM

## 2021-07-27 DIAGNOSIS — Z89512 Acquired absence of left leg below knee: Secondary | ICD-10-CM

## 2021-07-27 DIAGNOSIS — Z888 Allergy status to other drugs, medicaments and biological substances status: Secondary | ICD-10-CM

## 2021-07-27 DIAGNOSIS — T68XXXA Hypothermia, initial encounter: Secondary | ICD-10-CM | POA: Diagnosis not present

## 2021-07-27 DIAGNOSIS — Z87891 Personal history of nicotine dependence: Secondary | ICD-10-CM

## 2021-07-27 DIAGNOSIS — E1122 Type 2 diabetes mellitus with diabetic chronic kidney disease: Secondary | ICD-10-CM | POA: Diagnosis present

## 2021-07-27 DIAGNOSIS — I10 Essential (primary) hypertension: Secondary | ICD-10-CM | POA: Diagnosis not present

## 2021-07-27 DIAGNOSIS — E11649 Type 2 diabetes mellitus with hypoglycemia without coma: Secondary | ICD-10-CM | POA: Diagnosis present

## 2021-07-27 DIAGNOSIS — U071 COVID-19: Secondary | ICD-10-CM

## 2021-07-27 DIAGNOSIS — K921 Melena: Secondary | ICD-10-CM | POA: Diagnosis not present

## 2021-07-27 DIAGNOSIS — G9340 Encephalopathy, unspecified: Secondary | ICD-10-CM

## 2021-07-27 DIAGNOSIS — E1140 Type 2 diabetes mellitus with diabetic neuropathy, unspecified: Secondary | ICD-10-CM | POA: Diagnosis present

## 2021-07-27 DIAGNOSIS — I132 Hypertensive heart and chronic kidney disease with heart failure and with stage 5 chronic kidney disease, or end stage renal disease: Secondary | ICD-10-CM | POA: Diagnosis present

## 2021-07-27 DIAGNOSIS — L89150 Pressure ulcer of sacral region, unstageable: Secondary | ICD-10-CM | POA: Diagnosis present

## 2021-07-27 DIAGNOSIS — E1129 Type 2 diabetes mellitus with other diabetic kidney complication: Secondary | ICD-10-CM | POA: Diagnosis present

## 2021-07-27 DIAGNOSIS — L732 Hidradenitis suppurativa: Secondary | ICD-10-CM | POA: Diagnosis present

## 2021-07-27 DIAGNOSIS — I5043 Acute on chronic combined systolic (congestive) and diastolic (congestive) heart failure: Secondary | ICD-10-CM | POA: Diagnosis present

## 2021-07-27 DIAGNOSIS — D649 Anemia, unspecified: Secondary | ICD-10-CM

## 2021-07-27 LAB — RESP PANEL BY RT-PCR (FLU A&B, COVID) ARPGX2
Influenza A by PCR: NEGATIVE
Influenza B by PCR: NEGATIVE
SARS Coronavirus 2 by RT PCR: POSITIVE — AB

## 2021-07-27 LAB — URINALYSIS, COMPLETE (UACMP) WITH MICROSCOPIC
Bacteria, UA: NONE SEEN
Bilirubin Urine: NEGATIVE
Glucose, UA: 150 mg/dL — AB
Hgb urine dipstick: NEGATIVE
Ketones, ur: NEGATIVE mg/dL
Leukocytes,Ua: NEGATIVE
Nitrite: NEGATIVE
Protein, ur: >=300 mg/dL — AB
Specific Gravity, Urine: 1.011 (ref 1.005–1.030)
pH: 7 (ref 5.0–8.0)

## 2021-07-27 LAB — HEPATITIS B SURFACE ANTIGEN: Hepatitis B Surface Ag: NONREACTIVE

## 2021-07-27 LAB — CBC WITH DIFFERENTIAL/PLATELET
Abs Immature Granulocytes: 0.11 10*3/uL — ABNORMAL HIGH (ref 0.00–0.07)
Basophils Absolute: 0.1 10*3/uL (ref 0.0–0.1)
Basophils Relative: 0 %
Eosinophils Absolute: 0.1 10*3/uL (ref 0.0–0.5)
Eosinophils Relative: 1 %
HCT: 20 % — ABNORMAL LOW (ref 39.0–52.0)
Hemoglobin: 6 g/dL — ABNORMAL LOW (ref 13.0–17.0)
Immature Granulocytes: 1 %
Lymphocytes Relative: 3 %
Lymphs Abs: 0.6 10*3/uL — ABNORMAL LOW (ref 0.7–4.0)
MCH: 29.4 pg (ref 26.0–34.0)
MCHC: 30 g/dL (ref 30.0–36.0)
MCV: 98 fL (ref 80.0–100.0)
Monocytes Absolute: 0.4 10*3/uL (ref 0.1–1.0)
Monocytes Relative: 2 %
Neutro Abs: 15.4 10*3/uL — ABNORMAL HIGH (ref 1.7–7.7)
Neutrophils Relative %: 93 %
Platelets: 224 10*3/uL (ref 150–400)
RBC: 2.04 MIL/uL — ABNORMAL LOW (ref 4.22–5.81)
RDW: 17.3 % — ABNORMAL HIGH (ref 11.5–15.5)
WBC: 16.6 10*3/uL — ABNORMAL HIGH (ref 4.0–10.5)
nRBC: 0 % (ref 0.0–0.2)

## 2021-07-27 LAB — COMPREHENSIVE METABOLIC PANEL
ALT: 22 U/L (ref 0–44)
AST: 20 U/L (ref 15–41)
Albumin: 2.3 g/dL — ABNORMAL LOW (ref 3.5–5.0)
Alkaline Phosphatase: 94 U/L (ref 38–126)
Anion gap: 8 (ref 5–15)
BUN: 58 mg/dL — ABNORMAL HIGH (ref 6–20)
CO2: 29 mmol/L (ref 22–32)
Calcium: 9.1 mg/dL (ref 8.9–10.3)
Chloride: 98 mmol/L (ref 98–111)
Creatinine, Ser: 5.24 mg/dL — ABNORMAL HIGH (ref 0.61–1.24)
GFR, Estimated: 12 mL/min — ABNORMAL LOW (ref >60)
Glucose, Bld: 142 mg/dL — ABNORMAL HIGH (ref 70–99)
Potassium: 4.4 mmol/L (ref 3.5–5.1)
Sodium: 135 mmol/L (ref 135–145)
Total Bilirubin: 0.6 mg/dL (ref 0.3–1.2)
Total Protein: 7 g/dL (ref 6.5–8.1)

## 2021-07-27 LAB — HEPATITIS B SURFACE ANTIBODY,QUALITATIVE: Hep B S Ab: REACTIVE — AB

## 2021-07-27 LAB — IRON AND TIBC
Iron: 24 ug/dL — ABNORMAL LOW (ref 45–182)
Saturation Ratios: 18 % (ref 17.9–39.5)
TIBC: 137 ug/dL — ABNORMAL LOW (ref 250–450)
UIBC: 113 ug/dL

## 2021-07-27 LAB — PREPARE RBC (CROSSMATCH)

## 2021-07-27 LAB — PROTIME-INR
INR: 1.2 (ref 0.8–1.2)
Prothrombin Time: 14.8 seconds (ref 11.4–15.2)

## 2021-07-27 LAB — RETICULOCYTES
Immature Retic Fract: 23.1 % — ABNORMAL HIGH (ref 2.3–15.9)
RBC.: 1.96 MIL/uL — ABNORMAL LOW (ref 4.22–5.81)
Retic Count, Absolute: 23.2 10*3/uL (ref 19.0–186.0)
Retic Ct Pct: 1.2 % (ref 0.4–3.1)

## 2021-07-27 LAB — CBG MONITORING, ED
Glucose-Capillary: 135 mg/dL — ABNORMAL HIGH (ref 70–99)
Glucose-Capillary: 151 mg/dL — ABNORMAL HIGH (ref 70–99)
Glucose-Capillary: 133 mg/dL — ABNORMAL HIGH (ref 70–99)
Glucose-Capillary: 139 mg/dL — ABNORMAL HIGH (ref 70–99)

## 2021-07-27 LAB — FERRITIN: Ferritin: 1581 ng/mL — ABNORMAL HIGH (ref 24–336)

## 2021-07-27 LAB — VITAMIN B12: Vitamin B-12: 445 pg/mL (ref 180–914)

## 2021-07-27 LAB — PROCALCITONIN: Procalcitonin: 0.56 ng/mL

## 2021-07-27 LAB — GLUCOSE, CAPILLARY
Glucose-Capillary: 155 mg/dL — ABNORMAL HIGH (ref 70–99)
Glucose-Capillary: 121 mg/dL — ABNORMAL HIGH (ref 70–99)

## 2021-07-27 LAB — FOLATE: Folate: 7.8 ng/mL (ref >5.9)

## 2021-07-27 LAB — APTT: aPTT: 36 seconds (ref 24–36)

## 2021-07-27 LAB — C-REACTIVE PROTEIN: CRP: 3.8 mg/dL — ABNORMAL HIGH (ref <1.0)

## 2021-07-27 LAB — SEDIMENTATION RATE: Sed Rate: 126 mm/hr — ABNORMAL HIGH (ref 0–20)

## 2021-07-27 LAB — LACTIC ACID, PLASMA
Lactic Acid, Venous: 1.1 mmol/L (ref 0.5–1.9)
Lactic Acid, Venous: 0.9 mmol/L (ref 0.5–1.9)

## 2021-07-27 LAB — LIPASE, BLOOD: Lipase: 31 U/L (ref 11–51)

## 2021-07-27 LAB — MAGNESIUM: Magnesium: 1.9 mg/dL (ref 1.7–2.4)

## 2021-07-27 MED ORDER — GABAPENTIN 300 MG PO CAPS: 300.0000 mg | ORAL_CAPSULE | Freq: Every day | ORAL | Status: DC

## 2021-07-27 MED ORDER — LISINOPRIL 20 MG PO TABS
40.0000 mg | ORAL_TABLET | Freq: Every day | ORAL | Status: DC
  Administered 2021-07-27 – 2021-08-02 (×7): 40 mg via ORAL
  Filled 2021-07-27: qty 8
  Filled 2021-07-27 (×4): qty 2
  Filled 2021-07-27: qty 4
  Filled 2021-07-27: qty 2

## 2021-07-27 MED ORDER — ACETAMINOPHEN 325 MG PO TABS
650.0000 mg | ORAL_TABLET | Freq: Four times a day (QID) | ORAL | Status: DC | PRN
  Administered 2021-07-27 – 2021-07-28 (×2): 650 mg via ORAL
  Filled 2021-07-27 (×2): qty 2

## 2021-07-27 MED ORDER — ADULT MULTIVITAMIN W/MINERALS CH
ORAL_TABLET | Freq: Every day | ORAL | Status: DC
  Administered 2021-07-27 – 2021-07-28 (×2): 1 via ORAL
  Filled 2021-07-27 (×2): qty 1

## 2021-07-27 MED ORDER — EPOETIN ALFA 4000 UNIT/ML IJ SOLN
4000.0000 [IU] | INTRAMUSCULAR | Status: DC
  Administered 2021-07-29 – 2021-07-31 (×2): 4000 [IU] via INTRAVENOUS
  Filled 2021-07-27 (×3): qty 1

## 2021-07-27 MED ORDER — CHLORHEXIDINE GLUCONATE CLOTH 2 % EX PADS
6.0000 | MEDICATED_PAD | Freq: Every day | CUTANEOUS | Status: DC
  Administered 2021-07-28 – 2021-08-02 (×6): 6 via TOPICAL

## 2021-07-27 MED ORDER — HYDRALAZINE HCL 20 MG/ML IJ SOLN
5.0000 mg | INTRAMUSCULAR | Status: DC | PRN
  Administered 2021-07-27 – 2021-07-31 (×7): 5 mg via INTRAVENOUS
  Filled 2021-07-27 (×8): qty 1

## 2021-07-27 MED ORDER — LORAZEPAM 2 MG/ML IJ SOLN: 1.0000 mg | INTRAMUSCULAR | Status: DC | PRN

## 2021-07-27 MED ORDER — SODIUM CHLORIDE 0.9 % IV SOLN
10.0000 mL/h | Freq: Once | INTRAVENOUS | Status: AC
  Administered 2021-07-27: 14:00:00 10 mL/h via INTRAVENOUS

## 2021-07-27 MED ORDER — INFLUENZA VAC SPLIT QUAD 0.5 ML IM SUSY: 0.5000 mL | PREFILLED_SYRINGE | INTRAMUSCULAR | Status: DC

## 2021-07-27 MED ORDER — ALBUTEROL SULFATE HFA 108 (90 BASE) MCG/ACT IN AERS
2.0000 | INHALATION_SPRAY | RESPIRATORY_TRACT | Status: DC | PRN
  Filled 2021-07-27: qty 6.7

## 2021-07-27 MED ORDER — CARVEDILOL 25 MG PO TABS
25.0000 mg | ORAL_TABLET | Freq: Two times a day (BID) | ORAL | Status: DC
  Administered 2021-07-27 – 2021-08-02 (×12): 25 mg via ORAL
  Filled 2021-07-27 (×12): qty 1

## 2021-07-27 MED ORDER — SEVELAMER CARBONATE 800 MG PO TABS
800.0000 mg | ORAL_TABLET | Freq: Three times a day (TID) | ORAL | Status: DC
  Administered 2021-07-28 – 2021-08-02 (×15): 800 mg via ORAL
  Filled 2021-07-27 (×16): qty 1

## 2021-07-27 MED ORDER — DM-GUAIFENESIN ER 30-600 MG PO TB12
1.0000 | ORAL_TABLET | Freq: Two times a day (BID) | ORAL | Status: DC | PRN
  Administered 2021-07-31 (×2): 1 via ORAL
  Filled 2021-07-27 (×2): qty 1

## 2021-07-27 MED ORDER — PIPERACILLIN-TAZOBACTAM 3.375 G IVPB 30 MIN
3.3750 g | Freq: Once | INTRAVENOUS | Status: AC
  Administered 2021-07-27: 3.375 g via INTRAVENOUS
  Filled 2021-07-27: qty 50

## 2021-07-27 MED ORDER — EPOETIN ALFA 4000 UNIT/ML IJ SOLN
INTRAMUSCULAR | Status: AC
  Filled 2021-07-27: qty 1

## 2021-07-27 MED ORDER — LEVETIRACETAM 750 MG PO TABS
750.0000 mg | ORAL_TABLET | Freq: Every day | ORAL | Status: DC
  Administered 2021-07-27 – 2021-07-29 (×3): 750 mg via ORAL
  Filled 2021-07-27 (×5): qty 1

## 2021-07-27 MED ORDER — ENSURE ENLIVE PO LIQD
237.0000 mL | Freq: Three times a day (TID) | ORAL | Status: DC
  Administered 2021-07-27 – 2021-08-02 (×17): 237 mL via ORAL

## 2021-07-27 MED ORDER — DEXTROSE 50 % IV SOLN: 50.0000 mL | INTRAVENOUS | Status: DC | PRN

## 2021-07-27 MED ORDER — LACTATED RINGERS IV BOLUS (SEPSIS)
500.0000 mL | Freq: Once | INTRAVENOUS | Status: AC
  Administered 2021-07-27: 500 mL via INTRAVENOUS

## 2021-07-27 MED ORDER — ATORVASTATIN CALCIUM 20 MG PO TABS
80.0000 mg | ORAL_TABLET | Freq: Every day | ORAL | Status: DC
  Administered 2021-07-27 – 2021-08-02 (×7): 80 mg via ORAL
  Filled 2021-07-27 (×7): qty 4

## 2021-07-27 MED ORDER — ASPIRIN 81 MG PO CHEW
81.0000 mg | CHEWABLE_TABLET | Freq: Every day | ORAL | Status: DC
  Administered 2021-07-27 – 2021-07-29 (×3): 81 mg via ORAL
  Filled 2021-07-27 (×3): qty 1

## 2021-07-27 MED ORDER — SPIRONOLACTONE 25 MG PO TABS: 25.0000 mg | ORAL_TABLET | Freq: Every day | ORAL | Status: DC

## 2021-07-27 MED ORDER — PIPERACILLIN-TAZOBACTAM IN DEX 2-0.25 GM/50ML IV SOLN
2.2500 g | Freq: Three times a day (TID) | INTRAVENOUS | Status: DC
  Administered 2021-07-27 – 2021-07-28 (×3): 2.25 g via INTRAVENOUS
  Filled 2021-07-27 (×5): qty 50

## 2021-07-27 MED ORDER — GABAPENTIN 300 MG PO CAPS: 400.0000 mg | ORAL_CAPSULE | Freq: Every day | ORAL | Status: DC

## 2021-07-27 NOTE — Consult Note (Addendum)
PHARMACY -  BRIEF ANTIBIOTIC NOTE   Pharmacy has received consult(s) for vancomycin from an ED provider.  The patient's profile has been reviewed for ht/wt/allergies/indication/available labs.    Patient has an allergy to vancomycin listed as shortness of breath, wheezing, and eyes watering.   Per discussion with MD, will hold off on vancomycin for now.   Further antibiotics/pharmacy consults should be ordered by admitting physician if indicated.                       Thank you, Darnelle Bos, PharmD 07/27/2021  8:34 AM

## 2021-07-27 NOTE — Progress Notes (Signed)
[name], [name] 07/27/21  Subjective:   Hospital day # 0   Patient known to our practice from previous admissions Admitted for hypoglycemia and left leg wound Family at bedside (wife) Able to eat lunch without nausea or vomiting Denies shortness of breath  Objective:  Vital signs in last 24 hours:  Temp:  [94.2 F (34.6 C)-98 F (36.7 C)] 98 F (36.7 C) (10/10 1326) Pulse Rate:  [30-65] 65 (10/10 1326) Resp:  [13-24] 18 (10/10 1326) BP: (147-179)/(64-77) 156/73 (10/10 1326) SpO2:  [97 %-100 %] 100 % (10/10 1326) Weight:  [63.5 kg] 63.5 kg (10/10 0653)  Weight change:  Filed Weights   07/27/21 0653  Weight: 63.5 kg    Intake/Output:    Intake/Output Summary (Last 24 hours) at 07/27/2021 1455 Last data filed at 07/27/2021 1230 Gross per 24 hour  Intake 640 ml  Output --  Net 640 ml   Physical Exam: General:  No acute distress, laying in the bed  HEENT  anicteric, moist oral mucous membrane  Pulm/lungs  normal breathing effort, room air, clear  CVS/Heart  no rub or gallop  Abdomen:   Soft, nontender  Extremities:  No peripheral edema, b/l BKA  Neurologic:  Alert, oriented, able to follow commands  Skin:  No acute rashes  Left arm AVF  Basic Metabolic Panel:  Recent Labs  Lab 07/27/21 0723  NA 135  K 4.4  CL 98  CO2 29  GLUCOSE 142*  BUN 58*  CREATININE 5.24*  CALCIUM 9.1  MG 1.9     CBC: Recent Labs  Lab 07/27/21 0723  WBC 16.6*  NEUTROABS 15.4*  HGB 6.0*  HCT 20.0*  MCV 98.0  PLT 224      Lab Results  Component Value Date   HEPBSAG NON REACTIVE 05/19/2021   HEPBSAB Reactive (A) 02/09/2021      Microbiology:  Recent Results (from the past 240 hour(s))  Resp Panel by RT-PCR (Flu A&B, Covid) Nasopharyngeal Swab     Status: Abnormal   Collection Time: 07/27/21  7:55 AM   Specimen: Nasopharyngeal Swab; Nasopharyngeal(NP) swabs in vial transport medium  Result Value Ref Range Status   SARS  Coronavirus 2 by RT PCR POSITIVE (A) NEGATIVE Final    Comment: RESULT CALLED TO, READ BACK BY AND VERIFIED WITH: Ricke Hey, RN 0900 07/27/21 GM (NOTE) SARS-CoV-2 target nucleic acids are DETECTED.  The SARS-CoV-2 RNA is generally detectable in upper respiratory specimens during the acute phase of infection. Positive results are indicative of the presence of the identified virus, but do not rule out bacterial infection or co-infection with other pathogens not detected by the test. Clinical correlation with patient history and other diagnostic information is necessary to determine patient infection status. The expected result is Negative.  Fact Sheet for Patients: EntrepreneurPulse.com.au  Fact Sheet for Healthcare Providers: IncredibleEmployment.be  This test is not yet approved or cleared by the [place] FDA and  has been authorized for detection and/or diagnosis of SARS-CoV-2 by FDA under an Emergency Use Authorization (EUA).  This EUA will remain in effect (meaning this test can be  used) for the duration of  the COVID-19 declaration under Section 564(b)(1) of the Act, 21 U.S.C. section 360bbb-3(b)(1), unless the authorization is terminated or revoked sooner.     Influenza A by PCR NEGATIVE NEGATIVE Final   Influenza B by PCR NEGATIVE NEGATIVE Final    Comment: (NOTE) The Xpert Xpress SARS-CoV-2/FLU/RSV plus assay is intended as an aid  in the diagnosis of influenza from Nasopharyngeal swab specimens and should not be used as a sole basis for treatment. Nasal washings and aspirates are unacceptable for Xpert Xpress SARS-CoV-2/FLU/RSV testing.  Fact Sheet for Patients: EntrepreneurPulse.com.au  Fact Sheet for Healthcare Providers: IncredibleEmployment.be  This test is not yet approved or cleared by the [place] FDA and has been authorized for detection and/or diagnosis of SARS-CoV-2  by FDA under an Emergency Use Authorization (EUA). This EUA will remain in effect (meaning this test can be used) for the duration of the COVID-19 declaration under Section 564(b)(1) of the Act, 21 U.S.C. section 360bbb-3(b)(1), unless the authorization is terminated or revoked.  Performed at [facility], [address] [place], [place] [address]     Coagulation Studies: Recent Labs    07/27/21 0723  LABPROT 14.8  INR 1.2    Urinalysis: Recent Labs    07/27/21 0724  COLORURINE YELLOW*  LABSPEC 1.011  PHURINE 7.0  GLUCOSEU 150*  HGBUR NEGATIVE  BILIRUBINUR NEGATIVE  KETONESUR NEGATIVE  PROTEINUR >=300*  NITRITE NEGATIVE  LEUKOCYTESUR NEGATIVE      Imaging: CT HEAD WO CONTRAST (5MM)  Result Date: 07/27/2021 CLINICAL DATA:  Mental status change, unknown cause EXAM: CT HEAD WITHOUT CONTRAST TECHNIQUE: Contiguous axial images were obtained from the base of the skull through the vertex without intravenous contrast. COMPARISON:  06/09/2021 FINDINGS: Brain: There is no acute intracranial hemorrhage, mass effect, or edema. No new loss of gray-white differentiation. Encephalomalacia with areas of calcification again identified in the right frontal lobe and insula. Stable prominence of ventricles and sulci reflecting parenchymal volume loss. Stable patchy and confluent areas of low-attenuation in the cerebral white matter likely reflecting chronic microvascular ischemic changes. Vascular: There is atherosclerotic calcification at the skull base. Skull: Calvarium is unremarkable. Sinuses/Orbits: No acute finding. Other: Partially imaged suboccipital soft tissue density is also present on prior imaging. IMPRESSION: No acute intracranial abnormality. Stable chronic findings detailed above. Electronically Signed   By: Macy Mis M.D.   On: 07/27/2021 08:34   CT PELVIS WO CONTRAST  Result Date: 07/27/2021 CLINICAL DATA:  60 year old male purulent wound right buttock.  EXAM: CT PELVIS WITHOUT CONTRAST TECHNIQUE: Multidetector CT imaging of the pelvis was performed following the standard protocol without intravenous contrast. COMPARISON:  CT Abdomen and Pelvis 02/06/2021 CT pelvis with contrast 12/06/2017. FINDINGS: Urinary Tract: Negative noncontrast visible right kidney. No hydroureter is evident. Diminutive bladder. Bowel: Retained stool throughout the visible large bowel. No dilated large or small bowel loops. Vascular/Lymphatic: Diffuse severe calcified atherosclerosis throughout the visible abdomen and pelvis. Vascular patency is not evaluated in the absence of IV contrast. No lymphadenopathy is evident. Reproductive: Soft tissue wound evident along the right gluteal fold on series 2, image 53. Soft tissue wound in this same region present in 2019, but now appears larger since April. Regional soft tissue thickening, granulation tissue which is inseparable from the anal verge as well as the distal rectum. There is no tracking soft tissue gas. There is no fluid collection evident on this noncontrast exam. Other: Small volume ascites suspected about the liver (series 2, image 1). No definite pelvic free fluid. Paucity of visceral fat now in the abdomen and pelvis. Negative visible noncontrast liver and gallbladder. Musculoskeletal: Stable visualized osseous structures., including the sacrum and coccygeal segments since 2019. Chronic renal osteodystrophy suspected. IMPRESSION: 1. Chronic soft tissue wound of the right gluteal fold. This appears mildly larger since April and might directly involve the anus. But there is  no tracking soft tissue, no fluid collection evident on this noncontrast exam, and no pelvic osteomyelitis. 2. Small volume ascites suspected about the visible liver. 3. Chronic renal osteodystrophy suspected. And very severe calcified atherosclerosis throughout the visible abdomen and pelvis. Electronically Signed   By: Genevie Ann M.D.   On: 07/27/2021 08:44   DG  Chest Portable 1 View  Result Date: 07/27/2021 CLINICAL DATA:  60 year old male with history of hypoglycemia. Dyspnea. Concern for aspiration. EXAM: PORTABLE CHEST 1 VIEW COMPARISON:  Chest x-ray 06/09/2021. FINDINGS: Vascular stents noted in the region of the left axillary vein and innominate vein. There is marked cephalization of the pulmonary vasculature, severe indistinctness of the interstitial markings, and extensive multifocal airspace disease throughout the lungs bilaterally (right greater than left) suggestive of severe pulmonary edema. Small right pleural effusion. No definite left pleural effusion. Heart size is moderately enlarged. Upper mediastinal contours are distorted by patient's rotation to the right. Atherosclerotic calcifications in the thoracic aorta. IMPRESSION: 1. The appearance the chest again suggests severe congestive heart failure, as above. 2. Given the slight asymmetry of the airspace disease, the possibility of superimposed right-sided bronchopneumonia is not excluded, particularly in light of the asymmetric right-sided pleural effusion. 3. Aortic atherosclerosis. Electronically Signed   By: Vinnie Langton M.D.   On: 07/27/2021 07:36     Medications:    piperacillin-tazobactam (ZOSYN)  IV      aspirin  81 mg Oral Daily   atorvastatin  80 mg Oral Daily   carvedilol  25 mg Oral BID   feeding supplement  237 mL Oral TID BM   gabapentin  400 mg Oral QHS   levETIRAcetam  750 mg Oral Q1200   lisinopril  40 mg Oral Daily   multivitamin with minerals   Oral Daily   sevelamer carbonate  800 mg Oral TID WC   spironolactone  25 mg Oral QHS   acetaminophen, albuterol, dextromethorphan-guaiFENesin, dextrose, hydrALAZINE, LORazepam  Assessment/ Plan:  60 y.o. male with  with end stage renal disease on hemodialysis, diabetes mellitus type II, hypertension, CVA, peripheral vascular disease status post bilateral BKA    admitted on 07/27/2021 for Hypoglycemia [E16.2] Sepsis  ([place]) [A41.9] Acute on chronic anemia [D64.9] Sepsis with encephalopathy without septic shock, due to unspecified organism ([place]) [A41.9, R65.20, G93.40]  # ESRD # Secondary hyperparathyroidism of renal origin N 25.81  # Anemia of CKD # Covid -19 positive  BP and volume are acceptable; Plan for HD today. 2.5 hrs per covid protocol Continue EPO, blood transfusion planned Continue sevelamer with meals    LOS: 0 [name] 10/10/20222:55 PM  [place], [place]  Note: This note was prepared with Dragon dictation. Any transcription errors are unintentional

## 2021-07-27 NOTE — H&P (Signed)
History and Physical    Santana Edell Nair MGQ:[id number] DOB: [birth year] DOA: 07/27/2021  Referring MD/NP/PA:   PCP: [name], MD   Patient coming from:  The patient is coming from SNF  Chief Complaint: AMS, cough  HPI: [name] is a 60 y.o. male with medical history significant of ESRD-HD (MWF), hidradenitis suppurativa on [name], hypertension, hyperlipidemia, diabetes mellitus, stroke, peritonitis, ICH, DVT, s/p for bilateral BKA, Crohn's disease, anemia, seizure, PVD, mild cognitive impairment, sCHF with EF 30-35%, BPH, who presents with altered mental status and cough.  Per his wife at bedside, patient had positive COVID test on 9/19, but did not have significant symptoms.  Today patient was found to be confused and somnolent.  His blood sugar was found to be 37, which improved with 160s after treated with glucagon and D50 by EMS.  It was concerned that pt possibly aspirated.  His mental status has gradually improved in ED.  When I saw patient in ED, patient is drowsy, but is arousable, orientated x3.  He has mild SOB and cough with little mucus production.  Denies chest pain, fever or chills.  No nausea, vomiting, diarrhea or abdominal pain.  Patient had dialysis on Wednesday. Pt has a sacral ulcer.  ED Course: pt was found to have WBC 16.6, positive COVID PCR, lactic acid 1.1, 0.9, potassium 4.4, bicarbonate 29, creatinine 5.24, BUN 58, temperature 94.2, blood pressure 147/64, heart rate 58, RR 20, oxygen saturation 98% on room air.  CT of head is negative for acute intracranial abnormalities.  Chest x-ray showed CHF change with possible right pneumonia.  Patient is admitted to MedSurg bed as inpatient  CT of pelvis: 1. Chronic soft tissue wound of the right gluteal fold. This appears mildly larger since April and might directly involve the anus. But there is no tracking soft tissue, no fluid collection evident on this noncontrast exam, and no pelvic osteomyelitis.   2.  Small volume ascites suspected about the visible liver.   3. Chronic renal osteodystrophy suspected. And very severe calcified atherosclerosis throughout the visible abdomen and pelvis.    Review of Systems:   General: no fevers, chills, no body weight gain, has fatigue HEENT: no blurry vision, hearing changes or sore throat Respiratory: has dyspnea, coughing, no wheezing CV: no chest pain, no palpitations GI: no nausea, vomiting, abdominal pain, diarrhea, constipation GU: no dysuria, burning on urination, increased urinary frequency, hematuria  Ext: no leg edema. S/p of bilateral BKA  Neuro: no unilateral weakness, numbness, or tingling, no vision change or hearing loss Skin: has sacral ulcer MSK: No muscle spasm, no deformity, no limitation of range of movement in spin. Has AMS Heme: No easy bruising.  Travel history: No recent long distant travel.  Allergy:  Allergies  Allergen Reactions   Methotrexate Other (See Comments)    Blood count drops   Vancomycin Shortness Of Breath    Eyes watering, SOB, wheezing   Cefepime Other (See Comments)    Confusion    Tape     Past Medical History:  Diagnosis Date   Acute metabolic encephalopathy 11/23/7122   Anemia    Crohn disease ([place])    Diabetes mellitus without complication ([place])    DVT of lower extremity (deep venous thrombosis) ([place]) 2016   Empyema ([place]) 05/20/2017   Encephalopathy 12/04/2017   Fall at home, initial encounter 09/12/2020   Hidradenitis suppurativa    Hypertension    ICH (intracerebral hemorrhage) ([place])    Peritonitis ([place])  04/21/2017   Pyogenic arthritis of knee ([place]) 02/04/2016   Renal disorder    Sepsis ([place]) 01/12/2018   Stroke [facility])     Past Surgical History:  Procedure Laterality Date   A/V FISTULAGRAM Left 02/24/2021   Procedure: A/V FISTULAGRAM;  Surgeon: Katha Cabal, MD;  Location: [place] [facility] LAB;  Service: Cardiovascular;  Laterality: Left;   ABDOMINAL SURGERY     AMPUTATION FINGER  Left 06/2019   PR AMPUTATION LONG FINGER/THUMB+FLAPS [facility]   ANGIOPLASTY Left    left fem-pop at [facility] 04-11-2018   BELOW KNEE LEG AMPUTATION Right 08/2017   [facility]   COLONOSCOPY     COLONOSCOPY WITH PROPOFOL N/A 10/28/2020   Procedure: COLONOSCOPY WITH PROPOFOL;  Surgeon: Lin Landsman, MD;  Location: [place];  Service: Gastroenterology;  Laterality: N/A;   COLONOSCOPY WITH PROPOFOL N/A 11/21/2020   Procedure: COLONOSCOPY WITH PROPOFOL;  Surgeon: [name], MD;  Location: [facility] ENDOSCOPY;  Service: Endoscopy;  Laterality: N/A;   DIALYSIS/PERMA CATHETER INSERTION N/A 12/09/2017   Procedure: DIALYSIS/PERMA CATHETER INSERTION;  Surgeon: Katha Cabal, MD;  Location: [place] [facility];  Service: Cardiovascular;  Laterality: N/A;   DIALYSIS/PERMA CATHETER INSERTION N/A 12/12/2017   Procedure: DIALYSIS/PERMA CATHETER INSERTION;  Surgeon: Algernon Huxley, MD;  Location: [place] CV LAB;  Service: Cardiovascular;  Laterality: N/A;   DIALYSIS/PERMA CATHETER REMOVAL Left 12/09/2017   Procedure: DIALYSIS/PERMA CATHETER REMOVAL;  Surgeon: Katha Cabal, MD;  Location: [place] [facility] LAB;  Service: Cardiovascular;  Laterality: Left;   ESOPHAGOGASTRODUODENOSCOPY (EGD) WITH PROPOFOL N/A 11/20/2020   Procedure: ESOPHAGOGASTRODUODENOSCOPY (EGD) WITH PROPOFOL;  Surgeon: [name], MD;  Location: [facility] ENDOSCOPY;  Service: Endoscopy;  Laterality: N/A;   KNEE SURGERY Left 02/04/2016   [facility]   LEG AMPUTATION THROUGH LOWER TIBIA AND FIBULA Left 06/22/2018   [facility]   LOWER EXTREMITY ANGIOGRAPHY Right 08/08/2017   Procedure: Lower Extremity Angiography;  Surgeon: Algernon Huxley, MD;  Location: [place] [facility];  Service: Cardiovascular;  Laterality: Right;   LOWER EXTREMITY ANGIOGRAPHY Right 08/22/2017   Procedure: Lower Extremity Angiography;  Surgeon: Algernon Huxley, MD;  Location: [place] CV LAB;  Service: Cardiovascular;  Laterality: Right;   LOWER EXTREMITY INTERVENTION  08/08/2017    Procedure: LOWER EXTREMITY INTERVENTION;  Surgeon: Algernon Huxley, MD;  Location: [place] [facility];  Service: Cardiovascular;;   LOWER EXTREMITY INTERVENTION  08/22/2017   Procedure: LOWER EXTREMITY INTERVENTION;  Surgeon: Algernon Huxley, MD;  Location: [place] CV [facility];  Service: Cardiovascular;;    Social History:  reports that he has quit smoking. He has never used smokeless tobacco. He reports current drug use. Drug: Marijuana. He reports that he does not drink alcohol.  Family History:  Family History  Problem Relation Age of Onset   Irritable bowel syndrome Sister    Diabetes Sister    Heart disease Mother    Diabetes Mother    Heart disease Father    Rheumatic fever Father        as child   Psoriasis Brother    Arthritis Brother    Diabetes Sister    Diabetes Sister      Prior to Admission medications   Medication Sig Start Date End Date Taking? Authorizing Provider  acetaminophen (TYLENOL) 500 MG tablet Take 1,000 mg by mouth daily as needed for moderate pain or headache.     [provider]  Adalimumab ([name] PEN) 40 MG/0.4ML PNKT Inject 40 mg into the muscle once  a week.  11/27/18   [provider]  Alcohol Swabs PADS Use as directed to check blood sugar three times daily for insulin dependent type 2 diabetes. 10/20/17   [name], MD  aspirin 81 MG chewable tablet Chew 81 mg by mouth daily.    [provider]  atorvastatin (LIPITOR) 80 MG tablet TAKE 1 TABLET(80 MG) BY MOUTH DAILY 06/25/20   [name], MD  Blood Glucose Monitoring Suppl (ONE TOUCH ULTRA 2) w/Device KIT Use as directed to check blood sugar three times daily. E11.9 02/20/18   [name], MD  carvedilol (COREG) 25 MG tablet Take 1 tablet (25 mg total) by mouth 2 (two) times daily. 06/27/20   [name], MD  feeding supplement (ENSURE ENLIVE / ENSURE PLUS) LIQD Take 237 mLs by mouth 3 (three) times daily between meals. 05/15/21   Lorella Nimrod, MD  gabapentin  (NEURONTIN) 400 MG capsule Take 400 mg by mouth at bedtime. 05/27/21   [provider]  hydrALAZINE (APRESOLINE) 100 MG tablet Take 1 tablet (100 mg total) by mouth every 8 (eight) hours. 05/15/21   Lorella Nimrod, MD  Insulin Aspart FlexPen (NOVOLOG) 100 UNIT/ML Inject into the skin. Per sliding scale 06/01/21   [provider]  isosorbide mononitrate (IMDUR) 30 MG 24 hr tablet Take 1 tablet (30 mg total) by mouth daily. 07/02/21 09/30/21  Rise Mu, PA-C  levETIRAcetam (KEPPRA) 250 MG tablet Take 250 mg by mouth daily as needed. 05/31/21   [provider]  levETIRAcetam (KEPPRA) 500 MG tablet Take 1 tablet (500 mg total) by mouth daily. 05/16/21   Lorella Nimrod, MD  lisinopril (ZESTRIL) 40 MG tablet Take 40 mg by mouth daily. 12/17/20   [provider]  Multiple Vitamin (THEREMS PO) Take 1 tablet by mouth daily. multivitamin    [provider]  sevelamer carbonate (RENVELA) 800 MG tablet TAKE 1 TABLET(800 MG) BY MOUTH THREE TIMES DAILY 09/22/20   [name], MD    Physical Exam: Vitals:   07/27/21 1554 07/27/21 1643 07/27/21 1654 07/27/21 1715  BP: (!) 165/72   (!) 176/62  Pulse: 68   68  Resp: 17     Temp: 98.5 F (36.9 C) 98 F (36.7 C)    TempSrc:  Oral    SpO2: 98%   97%  Weight:   64.1 kg   Height:       General: Not in acute distress HEENT:       Eyes: PERRL, EOMI, no scleral icterus.       ENT: No discharge from the ears and nose, no pharynx injection, no tonsillar enlargement.        Neck: No JVD, no bruit, no mass felt. Heme: No neck lymph node enlargement. Cardiac: S1/S2, RRR, No murmurs, No gallops or rubs. Respiratory: No rales, wheezing, rhonchi or rubs. GI: Soft, nondistended, nontender, no rebound pain, no organomegaly, BS present. GU: No hematuria Ext: No pitting leg edema bilaterally.  S/p of bilateral BKA  musculoskeletal: No joint deformities, No joint redness or warmth, no limitation of ROM in spin. Skin: has a  small sacral ulcer     Neuro: Drowsy, but arousable, oriented X3, cranial nerves II-XII grossly intact, moves all extremities. Psych: Patient is not psychotic, no suicidal or hemocidal ideation.  Labs on Admission: I have personally reviewed following labs and imaging studies  CBC: Recent Labs  Lab 07/27/21 0723  WBC 16.6*  NEUTROABS 15.4*  HGB 6.0*  HCT 20.0*  MCV 98.0  PLT 308   Basic Metabolic Panel: Recent Labs  Lab 07/27/21 0723  NA 135  K 4.4  CL 98  CO2 29  GLUCOSE 142*  BUN 58*  CREATININE 5.24*  CALCIUM 9.1  MG 1.9   GFR: Estimated Creatinine Clearance: 13.7 mL/min (A) (by C-G formula based on SCr of 5.24 mg/dL (H)). Liver Function Tests: Recent Labs  Lab 07/27/21 0723  AST 20  ALT 22  ALKPHOS 94  BILITOT 0.6  PROT 7.0  ALBUMIN 2.3*   Recent Labs  Lab 07/27/21 0723  LIPASE 31   No results for input(s): AMMONIA in the last 168 hours. Coagulation Profile: Recent Labs  Lab 07/27/21 0723  INR 1.2   Cardiac Enzymes: No results for input(s): CKTOTAL, CKMB, CKMBINDEX, TROPONINI in the last 168 hours. BNP (last 3 results) No results for input(s): PROBNP in the last 8760 hours. HbA1C: No results for input(s): HGBA1C in the last 72 hours. CBG: Recent Labs  Lab 07/27/21 0656 07/27/21 0721 07/27/21 1110 07/27/21 1233 07/27/21 1556  GLUCAP 135* 151* 133* 139* 155*   Lipid Profile: No results for input(s): CHOL, HDL, LDLCALC, TRIG, CHOLHDL, LDLDIRECT in the last 72 hours. Thyroid Function Tests: No results for input(s): TSH, T4TOTAL, FREET4, T3FREE, THYROIDAB in the last 72 hours. Anemia Panel: Recent Labs    07/27/21 0723  FOLATE 7.8  FERRITIN 1,581*  TIBC 137*  IRON 24*  RETICCTPCT 1.2   Urine analysis:    Component Value Date/Time   COLORURINE YELLOW (A) 07/27/2021 0724   APPEARANCEUR HAZY (A) 07/27/2021 0724   LABSPEC 1.011 07/27/2021 0724   PHURINE 7.0 07/27/2021 0724   GLUCOSEU 150 (A) 07/27/2021 0724   HGBUR NEGATIVE  07/27/2021 0724   BILIRUBINUR NEGATIVE 07/27/2021 0724   KETONESUR NEGATIVE 07/27/2021 0724   PROTEINUR >=300 (A) 07/27/2021 0724   NITRITE NEGATIVE 07/27/2021 0724   LEUKOCYTESUR NEGATIVE 07/27/2021 0724   Sepsis Labs: @LABRCNTIP (procalcitonin:4,lacticidven:4) ) Recent Results (from the past 240 hour(s))  Aerobic Culture w Gram Stain (superficial specimen)     Status: None (Preliminary result)   Collection Time: 07/27/21  7:48 AM   Specimen: Wound  Result Value Ref Range Status   Specimen Description   Final    WOUND Performed at [facility], [address] [place], [place] [address]    Special Requests   Final    NONE Performed at [facility], [address] [place], [place] [address]    Gram Stain   Final    FEW WBC PRESENT,BOTH PMN AND MONONUCLEAR RARE GRAM POSITIVE COCCI IN PAIRS Performed at [facility], [place] [address], [place], [place] [address]    Culture PENDING  Incomplete   Report Status PENDING  Incomplete  Resp Panel by RT-PCR (Flu A&B, Covid) Nasopharyngeal Swab     Status: Abnormal   Collection Time: 07/27/21  7:55 AM   Specimen: Nasopharyngeal Swab; Nasopharyngeal(NP) swabs in vial transport medium  Result Value Ref Range Status   SARS Coronavirus 2 by RT PCR POSITIVE (A) NEGATIVE Final    Comment: RESULT CALLED TO, READ BACK BY AND VERIFIED WITH: Ricke Hey, RN 0900 07/27/21 GM (NOTE) SARS-CoV-2 target nucleic acids are DETECTED.  The SARS-CoV-2 RNA is generally detectable in upper respiratory specimens during the acute phase of infection. Positive results are indicative of the presence of the identified virus, but do not rule out bacterial infection or co-infection with other pathogens not detected by the test. Clinical  correlation with patient history and other diagnostic information is necessary to determine patient infection status. The expected result is Negative.  Fact Sheet for  Patients: EntrepreneurPulse.com.au  Fact Sheet for Healthcare Providers: IncredibleEmployment.be  This test is not yet approved or cleared by the [place] FDA and  has been authorized for detection and/or diagnosis of SARS-CoV-2 by FDA under an Emergency Use Authorization (EUA).  This EUA will remain in effect (meaning this test can be  used) for the duration of  the COVID-19 declaration under Section 564(b)(1) of the Act, 21 U.S.C. section 360bbb-3(b)(1), unless the authorization is terminated or revoked sooner.     Influenza A by PCR NEGATIVE NEGATIVE Final   Influenza B by PCR NEGATIVE NEGATIVE Final    Comment: (NOTE) The Xpert Xpress SARS-CoV-2/FLU/RSV plus assay is intended as an aid in the diagnosis of influenza from Nasopharyngeal swab specimens and should not be used as a sole basis for treatment. Nasal washings and aspirates are unacceptable for Xpert Xpress SARS-CoV-2/FLU/RSV testing.  Fact Sheet for Patients: EntrepreneurPulse.com.au  Fact Sheet for Healthcare Providers: IncredibleEmployment.be  This test is not yet approved or cleared by the [place] FDA and has been authorized for detection and/or diagnosis of SARS-CoV-2 by FDA under an Emergency Use Authorization (EUA). This EUA will remain in effect (meaning this test can be used) for the duration of the COVID-19 declaration under Section 564(b)(1) of the Act, 21 U.S.C. section 360bbb-3(b)(1), unless the authorization is terminated or revoked.  Performed at [facility], [address] [place], [place] [address]      Radiological Exams on Admission: CT HEAD WO CONTRAST (5MM)  Result Date: 07/27/2021 CLINICAL DATA:  Mental status change, unknown cause EXAM: CT HEAD WITHOUT CONTRAST TECHNIQUE: Contiguous axial images were obtained from the base of the skull through the vertex without intravenous contrast. COMPARISON:   06/09/2021 FINDINGS: Brain: There is no acute intracranial hemorrhage, mass effect, or edema. No new loss of gray-white differentiation. Encephalomalacia with areas of calcification again identified in the right frontal lobe and insula. Stable prominence of ventricles and sulci reflecting parenchymal volume loss. Stable patchy and confluent areas of low-attenuation in the cerebral white matter likely reflecting chronic microvascular ischemic changes. Vascular: There is atherosclerotic calcification at the skull base. Skull: Calvarium is unremarkable. Sinuses/Orbits: No acute finding. Other: Partially imaged suboccipital soft tissue density is also present on prior imaging. IMPRESSION: No acute intracranial abnormality. Stable chronic findings detailed above. Electronically Signed   By: Macy Mis M.D.   On: 07/27/2021 08:34   CT PELVIS WO CONTRAST  Result Date: 07/27/2021 CLINICAL DATA:  59 year old male purulent wound right buttock. EXAM: CT PELVIS WITHOUT CONTRAST TECHNIQUE: Multidetector CT imaging of the pelvis was performed following the standard protocol without intravenous contrast. COMPARISON:  CT Abdomen and Pelvis 02/06/2021 CT pelvis with contrast 12/06/2017. FINDINGS: Urinary Tract: Negative noncontrast visible right kidney. No hydroureter is evident. Diminutive bladder. Bowel: Retained stool throughout the visible large bowel. No dilated large or small bowel loops. Vascular/Lymphatic: Diffuse severe calcified atherosclerosis throughout the visible abdomen and pelvis. Vascular patency is not evaluated in the absence of IV contrast. No lymphadenopathy is evident. Reproductive: Soft tissue wound evident along the right gluteal fold on series 2, image 53. Soft tissue wound in this same region present in 2019, but now appears larger since April. Regional soft tissue thickening, granulation tissue which is inseparable from the anal verge as well as the distal rectum. There is no tracking soft tissue  gas.  There is no fluid collection evident on this noncontrast exam. Other: Small volume ascites suspected about the liver (series 2, image 1). No definite pelvic free fluid. Paucity of visceral fat now in the abdomen and pelvis. Negative visible noncontrast liver and gallbladder. Musculoskeletal: Stable visualized osseous structures., including the sacrum and coccygeal segments since 2019. Chronic renal osteodystrophy suspected. IMPRESSION: 1. Chronic soft tissue wound of the right gluteal fold. This appears mildly larger since April and might directly involve the anus. But there is no tracking soft tissue, no fluid collection evident on this noncontrast exam, and no pelvic osteomyelitis. 2. Small volume ascites suspected about the visible liver. 3. Chronic renal osteodystrophy suspected. And very severe calcified atherosclerosis throughout the visible abdomen and pelvis. Electronically Signed   By: Genevie Ann M.D.   On: 07/27/2021 08:44   DG Chest Portable 1 View  Result Date: 07/27/2021 CLINICAL DATA:  60 year old male with history of hypoglycemia. Dyspnea. Concern for aspiration. EXAM: PORTABLE CHEST 1 VIEW COMPARISON:  Chest x-ray 06/09/2021. FINDINGS: Vascular stents noted in the region of the left axillary vein and innominate vein. There is marked cephalization of the pulmonary vasculature, severe indistinctness of the interstitial markings, and extensive multifocal airspace disease throughout the lungs bilaterally (right greater than left) suggestive of severe pulmonary edema. Small right pleural effusion. No definite left pleural effusion. Heart size is moderately enlarged. Upper mediastinal contours are distorted by patient's rotation to the right. Atherosclerotic calcifications in the thoracic aorta. IMPRESSION: 1. The appearance the chest again suggests severe congestive heart failure, as above. 2. Given the slight asymmetry of the airspace disease, the possibility of superimposed right-sided  bronchopneumonia is not excluded, particularly in light of the asymmetric right-sided pleural effusion. 3. Aortic atherosclerosis. Electronically Signed   By: Vinnie Langton M.D.   On: 07/27/2021 07:36     EKG: I have personally reviewed.  Sinus rhythm, QTC 516, mild T wave inversion in V4-V5   Assessment/Plan Principal Problem:   Aspiration pneumonia (HCC) Active Problems:   Hypercholesteremia   Hypertension   End stage renal failure on dialysis ([place])   Seizure disorder (HCC)   S/P bilateral BKA (below knee amputation) (HCC)   Sepsis (HCC)   Acute metabolic encephalopathy   Chronic systolic CHF (congestive heart failure) (HCC)   Unstageable pressure ulcer of sacral region ([place])   Type II diabetes mellitus with renal manifestations (HCC)   Anemia in ESRD (end-stage renal disease) ([place])   Hypoglycemia   Hypothermia   COVID-19 virus infection   Sepsis due to aspiration pneumonia: Patient meets criteria for sepsis with WBC 16.6, hypothermia with body temperature 94.2.  Lactic acid is normal 1.1, 0.9.  -Admitted to MedSurg bed as inpatient -Zosyn IV -Sputum culture and blood culture -will get Procalcitonin and trend lactic acid levels per sepsis protocol. -IVF:  500 cc of LR  Acute metabolic encephalopathy: CT head negative for acute intracranial abnormalities, most likely due to hypoglycemia which is corrected.  Mental status has improved. -Frequent neuro check  Hypercholesteremia -Lipitor  Hypertension -IV hydralazine as needed -Coreg, lisinopril -Hold oral hydralazine and spironolactone since patient is at risk of developing hypotension  End stage renal failure on dialysis [facility]) -Dr. Candiss Norse of nephrology is consulted for dialysis  Seizure disorder [facility]) -Seizure precaution -As needed Ativan for seizure -Continue Keppra  S/P bilateral BKA (below knee amputation) ([place]) -Fall precaution  Chronic systolic CHF (congestive heart failure) ([place]): 2D echo on 05/13/2021  showed EF 30-35%.  No oxygen desaturation.  Patient  does not seem to have CHF exacerbation -Volume management per renal for dialysis  Unstageable pressure ulcer of sacral region ([place]) -Wound care consult  Hypoglycemia: Blood sugar 37, improving after treated with glucagon and D50 -Hold NovoLog -Check CBG every 3 hours -D50 as needed  Type II diabetes mellitus with renal manifestations (HCC) -Hold NovoLog due to hypoglycemia  Anemia in ESRD (end-stage renal disease) ([place]): Hemoglobin 6.0 (7.6 on 06/13/2021), no active bleeding -Transfuse 1 unit of blood  Hypothermia: Due to hypoglycemia -Bair hugger  COVID-19 virus infection: Per his wife, patient had a positive COVID test 2 weeks ago, out of window for treatment -Supportive care -Bronchodilators     DVT ppx: no heparin or Lovenox due to severe anemia, patient has bilateral BKA,  cannot use SCD Code Status: Full code Family Communication:  Yes, patient's  wife  at bed side Disposition Plan:  Anticipate discharge back to previous environment Consults called:  none Admission status and Level of care: Med-Surg:     as inpt    Status is: Inpatient  Remains inpatient appropriate because:Inpatient level of care appropriate due to severity of illness  Dispo: The patient is from: SNF              Anticipated d/c is to: SNF              Patient currently is not medically stable to d/c.   Difficult to place patient No           Date of Service 07/27/2021    [facility]   If 7PM-7AM, please contact night-coverage [email or web address] 07/27/2021, 5:32 PM

## 2021-07-27 NOTE — Consult Note (Signed)
Pharmacy Antibiotic Note  [name] is a 60 y.o. male with PMH of ESRD-HD (MWF), hidradenitis suppurativa on [name], hypertension, hyperlipidemia, diabetes mellitus, stroke, peritonitis, ICH, DVT, s/p for bilateral BKA, Crohn's disease, anemia, seizure, PVD, mild cognitive impairment, sCHF with EF 30-35%, BPH who was admitted on 07/27/2021 with sepsis.  Pharmacy has been consulted for Zosyn dosing.  Afeb since admission, WBC 16.6, PCT 0.56  Bcx and sputum culture sent, Ucx from in/out cath sent  Pt with noted allergy to cefepime, but has tolerated Zosyn previously   Plan: Zosyn --Will start Zosyn 2.69m IV q8h   Will continue to monitor culture results to de-escalate therapy as indicated   Height: 5' 6"  (167.6 cm) Weight: 63.5 kg (140 lb) IBW/kg (Calculated) : 63.8  Temp (24hrs), Avg:96.2 F (35.7 C), Min:94.2 F (34.6 C), Max:97.8 F (36.6 C)  Recent Labs  Lab 07/27/21 0723 07/27/21 0845  WBC 16.6*  --   CREATININE 5.24*  --   LATICACIDVEN 1.1 0.9    Estimated Creatinine Clearance: 13.6 mL/min (A) (by C-G formula based on SCr of 5.24 mg/dL (H)).    Allergies  Allergen Reactions   Methotrexate Other (See Comments)    Blood count drops   Vancomycin Shortness Of Breath    Eyes watering, SOB, wheezing   Cefepime Other (See Comments)    Confusion    Tape     Antimicrobials this admission: Zosyn 10/10 >>    Microbiology results: 10/10 BCx: sent 10/10 UCx: sent  10/10 Sputum: sent   Thank you for allowing pharmacy to be a part of this patient's care.  BNarda Rutherford PharmD Pharmacy Resident  07/27/2021 12:54 PM

## 2021-07-27 NOTE — Plan of Care (Signed)

## 2021-07-27 NOTE — Consult Note (Signed)
CODE SEPSIS - PHARMACY COMMUNICATION  **Broad Spectrum Antibiotics should be administered within 1 hour of Sepsis diagnosis**  Time Code Sepsis Called/Page Received: 0825  Antibiotics Ordered: 0016  Time of 1st antibiotic administration: 0848  Additional action taken by pharmacy: N/A  If necessary, Name of Provider/Nurse Contacted: N/A    Darnelle Bos ,PharmD Clinical Pharmacist  07/27/2021  8:33 AM

## 2021-07-27 NOTE — Sepsis Progress Note (Signed)
Sepsis protocol is being followed by [facility]. 

## 2021-07-27 NOTE — ED Provider Notes (Signed)
[facility] Emergency Department Provider Note ____________________________________________   Event Date/Time   First MD Initiated Contact with Patient 07/27/21 0703     (approximate)  I have reviewed the triage vital signs and the nursing notes.  HISTORY  Chief Complaint Hypoglycemia   HPI [name] is a 60 y.o. malewho presents to the ED for evaluation of hypoglycemia.   Chart review indicates ESRD on iHD MWF, DM, HTN, seizure disorder, bilateral BKA, vasculopath, Crohn's.  Patient presents to the ED from his local SNF via EMS for evaluation of hypoglycemia.  Glucose in the 30s and 40s at his facility, where he received D50 and glucagon.  Here in the ED, patient is somnolent and cannot provide any relevant history.  First CBG here in the ED in the 150s.  Past Medical History:  Diagnosis Date   Acute metabolic encephalopathy 4/0/9811   Anemia    Crohn disease ([place])    Diabetes mellitus without complication ([place])    DVT of lower extremity (deep venous thrombosis) ([place]) 2016   Empyema ([place]) 05/20/2017   Encephalopathy 12/04/2017   Fall at home, initial encounter 09/12/2020   Hidradenitis suppurativa    Hypertension    ICH (intracerebral hemorrhage) ([place])    Peritonitis ([place]) 04/21/2017   Pyogenic arthritis of knee ([place]) 02/04/2016   Renal disorder    Sepsis ([place]) 01/12/2018   Stroke [facility])     Patient Active Problem List   Diagnosis Date Noted   Unstageable pressure ulcer of sacral region ([place]) 05/22/2021   Acute decompensated heart failure ([place]) 05/19/2021   Acute respiratory failure with hypoxia ([place]) 05/18/2021   Protein-calorie malnutrition, severe 05/14/2021   Acute pulmonary edema (H[facility])    Shortness of breath    Fluid overload 05/12/2021   Cellulitis and abscess of buttock    History of GI bleed 02/06/2021   Long term current use of immunosuppressive drug 02/06/2021   Disorder of skin due to Crohn's disease ([place]) 91/47/8295    Complication of vascular access for dialysis 01/22/2021   Polyp of transverse colon    Acute metabolic encephalopathy 62/13/0865   Acute on chronic anemia 78/46/9629   Chronic systolic CHF (congestive heart failure) ([place]) 11/18/2020   Cerebrovascular disease 09/12/2020   Transient alteration of awareness 09/12/2020   Hyperkalemia 09/12/2020   Mild cognitive impairment 09/30/2019   Peripheral vascular disease ([place]) 06/28/2018   Sepsis ([place]) 01/12/2018   Pressure injury of skin 12/04/2017   S/P bilateral BKA (below knee amputation) ([place]) 10/20/2017   Atherosclerosis of native arteries of extremity with rest pain ([place]) 08/05/2017   History of CVA (cerebrovascular accident) 04/15/2017   Seizure disorder ([place]) 04/15/2017   End stage renal failure on dialysis ([place]) 04/12/2016   Aphthae 02/20/2016   Hidradenitis suppurativa 02/20/2016   Leg pain 02/20/2016   Neuropathy 02/20/2016   Narrowing of intervertebral disc space 08/29/2015   Vascular disorder of lower extremity 08/29/2015   Failure of erection 08/29/2015   Hypercholesteremia 08/29/2015   Hypertension 08/29/2015   Anemia due to chronic kidney disease 05/26/2015   Venous insufficiency of leg 09/04/2014   History of deep vein thrombosis (DVT) of lower extremity 08/16/2014   Prostatic intraepithelial neoplasia 11/02/2013   Elevated prostate specific antigen (PSA) 09/11/2013   Benign prostatic hyperplasia with urinary obstruction 08/13/2013   Spermatocele 08/13/2013   Avitaminosis D 01/25/2013   Type 2 diabetes mellitus with hypoglycemia without coma ([place]) 03/28/2012   Crohn disease ([place]) 08/03/2011    Past Surgical  History:  Procedure Laterality Date   A/V FISTULAGRAM Left 02/24/2021   Procedure: A/V FISTULAGRAM;  Surgeon: Katha Cabal, MD;  Location: [facility] [place];  Service: Cardiovascular;  Laterality: Left;   ABDOMINAL SURGERY     AMPUTATION FINGER Left 06/2019   PR AMPUTATION LONG FINGER/THUMB+FLAPS [facility]    ANGIOPLASTY Left    left fem-pop at [facility] 04-11-2018   BELOW KNEE LEG AMPUTATION Right 08/2017   [facility]   COLONOSCOPY     COLONOSCOPY WITH PROPOFOL N/A 10/28/2020   Procedure: COLONOSCOPY WITH PROPOFOL;  Surgeon: Lin Landsman, MD;  Location: [place];  Service: Gastroenterology;  Laterality: N/A;   COLONOSCOPY WITH PROPOFOL N/A 11/21/2020   Procedure: COLONOSCOPY WITH PROPOFOL;  Surgeon: [name], MD;  Location: [facility] ENDOSCOPY;  Service: Endoscopy;  Laterality: N/A;   DIALYSIS/PERMA CATHETER INSERTION N/A 12/09/2017   Procedure: DIALYSIS/PERMA CATHETER INSERTION;  Surgeon: Katha Cabal, MD;  Location: [place] [facility];  Service: Cardiovascular;  Laterality: N/A;   DIALYSIS/PERMA CATHETER INSERTION N/A 12/12/2017   Procedure: DIALYSIS/PERMA CATHETER INSERTION;  Surgeon: Algernon Huxley, MD;  Location: [place] [facility];  Service: Cardiovascular;  Laterality: N/A;   DIALYSIS/PERMA CATHETER REMOVAL Left 12/09/2017   Procedure: DIALYSIS/PERMA CATHETER REMOVAL;  Surgeon: Katha Cabal, MD;  Location: [place] [facility];  Service: Cardiovascular;  Laterality: Left;   ESOPHAGOGASTRODUODENOSCOPY (EGD) WITH PROPOFOL N/A 11/20/2020   Procedure: ESOPHAGOGASTRODUODENOSCOPY (EGD) WITH PROPOFOL;  Surgeon: [name], MD;  Location: [facility] ENDOSCOPY;  Service: Endoscopy;  Laterality: N/A;   KNEE SURGERY Left 02/04/2016   [facility]   LEG AMPUTATION THROUGH LOWER TIBIA AND FIBULA Left 06/22/2018   [facility]   LOWER EXTREMITY ANGIOGRAPHY Right 08/08/2017   Procedure: Lower Extremity Angiography;  Surgeon: Algernon Huxley, MD;  Location: [place] [facility];  Service: Cardiovascular;  Laterality: Right;   LOWER EXTREMITY ANGIOGRAPHY Right 08/22/2017   Procedure: Lower Extremity Angiography;  Surgeon: Algernon Huxley, MD;  Location: [place] [facility];  Service: Cardiovascular;  Laterality: Right;   LOWER EXTREMITY INTERVENTION  08/08/2017   Procedure: LOWER EXTREMITY INTERVENTION;  Surgeon: Algernon Huxley,  MD;  Location: [place] [facility];  Service: Cardiovascular;;   LOWER EXTREMITY INTERVENTION  08/22/2017   Procedure: LOWER EXTREMITY INTERVENTION;  Surgeon: Algernon Huxley, MD;  Location: [place] [facility];  Service: Cardiovascular;;    Prior to Admission medications   Medication Sig Start Date End Date Taking? Authorizing Provider  acetaminophen (TYLENOL) 500 MG tablet Take 1,000 mg by mouth daily as needed for moderate pain or headache.     [provider]  Adalimumab ([name] PEN) 40 MG/0.4ML PNKT Inject 40 mg into the muscle once a week.  11/27/18   [provider]  Alcohol Swabs PADS Use as directed to check blood sugar three times daily for insulin dependent type 2 diabetes. 10/20/17   [name], MD  aspirin 81 MG chewable tablet Chew 81 mg by mouth daily.    [provider]  atorvastatin (LIPITOR) 80 MG tablet TAKE 1 TABLET(80 MG) BY MOUTH DAILY 06/25/20   [name], MD  Blood Glucose Monitoring Suppl (ONE TOUCH ULTRA 2) w/Device KIT Use as directed to check blood sugar three times daily. E11.9 02/20/18   [name], MD  carvedilol (COREG) 25 MG tablet Take 1 tablet (25 mg total) by mouth 2 (two) times daily. 06/27/20   [name], MD  feeding supplement (ENSURE ENLIVE / ENSURE PLUS) LIQD Take  237 mLs by mouth 3 (three) times daily between meals. 05/15/21   Lorella Nimrod, MD  gabapentin (NEURONTIN) 400 MG capsule Take 400 mg by mouth at bedtime. 05/27/21   [provider]  hydrALAZINE (APRESOLINE) 100 MG tablet Take 1 tablet (100 mg total) by mouth every 8 (eight) hours. 05/15/21   Lorella Nimrod, MD  Insulin Aspart FlexPen (NOVOLOG) 100 UNIT/ML Inject into the skin. Per sliding scale 06/01/21   [provider]  isosorbide mononitrate (IMDUR) 30 MG 24 hr tablet Take 1 tablet (30 mg total) by mouth daily. 07/02/21 09/30/21  Rise Mu, PA-C  levETIRAcetam (KEPPRA) 250 MG tablet Take 250 mg by mouth daily as needed. 05/31/21    [provider]  levETIRAcetam (KEPPRA) 500 MG tablet Take 1 tablet (500 mg total) by mouth daily. 05/16/21   Lorella Nimrod, MD  lisinopril (ZESTRIL) 40 MG tablet Take 40 mg by mouth daily. 12/17/20   [provider]  Multiple Vitamin (THEREMS PO) Take 1 tablet by mouth daily. multivitamin    [provider]  sevelamer carbonate (RENVELA) 800 MG tablet TAKE 1 TABLET(800 MG) BY MOUTH THREE TIMES DAILY 09/22/20   [name], MD    Allergies Methotrexate, Vancomycin, Cefepime, and Tape  Family History  Problem Relation Age of Onset   Irritable bowel syndrome Sister    Diabetes Sister    Heart disease Mother    Diabetes Mother    Heart disease Father    Rheumatic fever Father        as child   Psoriasis Brother    Arthritis Brother    Diabetes Sister    Diabetes Sister     Social History Social History   Tobacco Use   Smoking status: Former   Smokeless tobacco: Never   Tobacco comments:    smokes marijuana  Vaping Use   Vaping Use: Never used  Substance Use Topics   Alcohol use: No   Drug use: Yes    Types: Marijuana    Review of Systems  Unable to be accurately assessed due to patient's altered mentation. ____________________________________________   PHYSICAL EXAM:  VITAL SIGNS: Vitals:   07/27/21 0750 07/27/21 0800  BP:    Pulse:  (!) 58  Resp:  15  Temp: (!) 94.2 F (34.6 C)   SpO2:  100%     Constitutional: Somnolent without distress.  Opens his eyes to loud voice and sternal rub, but quickly closes them and does not follow any commands.  Occasional wet-sounding cough. Eyes: Conjunctivae are normal. PERRL. EOMI. Head: Atraumatic. Nose: No congestion/rhinnorhea. Mouth/Throat: Mucous membranes are moist.  Oropharynx non-erythematous. Neck: No stridor. No cervical spine tenderness to palpation. Cardiovascular: Normal rate, regular rhythm. Grossly normal heart sounds.  Good peripheral circulation. Respiratory: Normal  respiratory effort.  No retractions. Clear left-sided lungs. Right side with decreased airflow and crackles throughout. Gastrointestinal: Soft , nondistended, nontender to palpation. No CVA tenderness. Musculoskeletal: No lower extremity tenderness nor edema.  No joint effusions. No signs of acute trauma. AV fistula to left upper arm. S/P bilateral BKA, clean stumps Neurologic:  E3V3M5 GCS 11.  Skin:  Skin is warm, dry and intact. No rash noted. Psychiatric: Mood and affect are difficult to assess.  ____________________________________________   LABS (all labs ordered are listed, but only abnormal results are displayed)  Labs Reviewed  RESP PANEL BY RT-PCR (FLU A&B, COVID) ARPGX2 - Abnormal; Notable for the following components:      Result Value   SARS  Coronavirus 2 by RT PCR POSITIVE (*)    All other components within normal limits  CBC WITH DIFFERENTIAL/PLATELET - Abnormal; Notable for the following components:   WBC 16.6 (*)    RBC 2.04 (*)    Hemoglobin 6.0 (*)    HCT 20.0 (*)    RDW 17.3 (*)    Neutro Abs 15.4 (*)    Lymphs Abs 0.6 (*)    Abs Immature Granulocytes 0.11 (*)    All other components within normal limits  COMPREHENSIVE METABOLIC PANEL - Abnormal; Notable for the following components:   Glucose, Bld 142 (*)    BUN 58 (*)    Creatinine, Ser 5.24 (*)    Albumin 2.3 (*)    GFR, Estimated 12 (*)    All other components within normal limits  URINALYSIS, COMPLETE (UACMP) WITH MICROSCOPIC - Abnormal; Notable for the following components:   Color, Urine YELLOW (*)    APPearance HAZY (*)    Glucose, UA 150 (*)    Protein, ur >=300 (*)    All other components within normal limits  CBG MONITORING, ED - Abnormal; Notable for the following components:   Glucose-Capillary 135 (*)    All other components within normal limits  CBG MONITORING, ED - Abnormal; Notable for the following components:   Glucose-Capillary 151 (*)    All other components within normal limits   CULTURE, BLOOD (SINGLE)  URINE CULTURE  AEROBIC CULTURE W GRAM STAIN (SUPERFICIAL SPECIMEN)  LIPASE, BLOOD  LACTIC ACID, PLASMA  MAGNESIUM  LACTIC ACID, PLASMA  PROCALCITONIN  PREPARE RBC (CROSSMATCH)  TYPE AND SCREEN   ____________________________________________  12 Lead EKG  Sinus rhythm, rate of 58 bpm.  Normal axis.  Prolonged QTC at 516 ms.  Inferior and lateral T wave inversions without STEMI. ____________________________________________  RADIOLOGY  ED MD interpretation:  CXR reviewed by me with right greater than left diffuse opacities. CT head reviewed by me without evidence of acute intracranial pathology. CT pelvis reviewed by me without clear abscess  Official radiology report(s): CT HEAD WO CONTRAST (5MM)  Result Date: 07/27/2021 CLINICAL DATA:  Mental status change, unknown cause EXAM: CT HEAD WITHOUT CONTRAST TECHNIQUE: Contiguous axial images were obtained from the base of the skull through the vertex without intravenous contrast. COMPARISON:  06/09/2021 FINDINGS: Brain: There is no acute intracranial hemorrhage, mass effect, or edema. No new loss of gray-white differentiation. Encephalomalacia with areas of calcification again identified in the right frontal lobe and insula. Stable prominence of ventricles and sulci reflecting parenchymal volume loss. Stable patchy and confluent areas of low-attenuation in the cerebral white matter likely reflecting chronic microvascular ischemic changes. Vascular: There is atherosclerotic calcification at the skull base. Skull: Calvarium is unremarkable. Sinuses/Orbits: No acute finding. Other: Partially imaged suboccipital soft tissue density is also present on prior imaging. IMPRESSION: No acute intracranial abnormality. Stable chronic findings detailed above. Electronically Signed   By: Macy Mis M.D.   On: 07/27/2021 08:34   CT PELVIS WO CONTRAST  Result Date: 07/27/2021 CLINICAL DATA:  60 year old male purulent wound  right buttock. EXAM: CT PELVIS WITHOUT CONTRAST TECHNIQUE: Multidetector CT imaging of the pelvis was performed following the standard protocol without intravenous contrast. COMPARISON:  CT Abdomen and Pelvis 02/06/2021 CT pelvis with contrast 12/06/2017. FINDINGS: Urinary Tract: Negative noncontrast visible right kidney. No hydroureter is evident. Diminutive bladder. Bowel: Retained stool throughout the visible large bowel. No dilated large or small bowel loops. Vascular/Lymphatic: Diffuse severe calcified atherosclerosis throughout the visible abdomen and pelvis.  Vascular patency is not evaluated in the absence of IV contrast. No lymphadenopathy is evident. Reproductive: Soft tissue wound evident along the right gluteal fold on series 2, image 53. Soft tissue wound in this same region present in 2019, but now appears larger since April. Regional soft tissue thickening, granulation tissue which is inseparable from the anal verge as well as the distal rectum. There is no tracking soft tissue gas. There is no fluid collection evident on this noncontrast exam. Other: Small volume ascites suspected about the liver (series 2, image 1). No definite pelvic free fluid. Paucity of visceral fat now in the abdomen and pelvis. Negative visible noncontrast liver and gallbladder. Musculoskeletal: Stable visualized osseous structures., including the sacrum and coccygeal segments since 2019. Chronic renal osteodystrophy suspected. IMPRESSION: 1. Chronic soft tissue wound of the right gluteal fold. This appears mildly larger since April and might directly involve the anus. But there is no tracking soft tissue, no fluid collection evident on this noncontrast exam, and no pelvic osteomyelitis. 2. Small volume ascites suspected about the visible liver. 3. Chronic renal osteodystrophy suspected. And very severe calcified atherosclerosis throughout the visible abdomen and pelvis. Electronically Signed   By: Genevie Ann M.D.   On: 07/27/2021  08:44   DG Chest Portable 1 View  Result Date: 07/27/2021 CLINICAL DATA:  60 year old male with history of hypoglycemia. Dyspnea. Concern for aspiration. EXAM: PORTABLE CHEST 1 VIEW COMPARISON:  Chest x-ray 06/09/2021. FINDINGS: Vascular stents noted in the region of the left axillary vein and innominate vein. There is marked cephalization of the pulmonary vasculature, severe indistinctness of the interstitial markings, and extensive multifocal airspace disease throughout the lungs bilaterally (right greater than left) suggestive of severe pulmonary edema. Small right pleural effusion. No definite left pleural effusion. Heart size is moderately enlarged. Upper mediastinal contours are distorted by patient's rotation to the right. Atherosclerotic calcifications in the thoracic aorta. IMPRESSION: 1. The appearance the chest again suggests severe congestive heart failure, as above. 2. Given the slight asymmetry of the airspace disease, the possibility of superimposed right-sided bronchopneumonia is not excluded, particularly in light of the asymmetric right-sided pleural effusion. 3. Aortic atherosclerosis. Electronically Signed   By: Vinnie Langton M.D.   On: 07/27/2021 07:36    ____________________________________________   PROCEDURES and INTERVENTIONS  Procedure(s) performed (including Critical Care):  .1-3 Lead EKG Interpretation Performed by: Vladimir Crofts, MD Authorized by: Vladimir Crofts, MD     Interpretation: normal     ECG rate:  58   ECG rate assessment: normal     Rhythm: sinus rhythm     Ectopy: none     Conduction: normal   .Critical Care Performed by: Vladimir Crofts, MD Authorized by: Vladimir Crofts, MD   Critical care provider statement:    Critical care time (minutes):  45   Critical care was necessary to treat or prevent imminent or life-threatening deterioration of the following conditions:  Sepsis   Critical care was time spent personally by me on the following activities:   Discussions with consultants, evaluation of patient's response to treatment, examination of patient, ordering and performing treatments and interventions, ordering and review of laboratory studies, ordering and review of radiographic studies, pulse oximetry, re-evaluation of patient's condition, obtaining history from patient or surrogate and review of old charts  Medications  lactated ringers bolus 500 mL (500 mLs Intravenous New Bag/Given 07/27/21 0852)  piperacillin-tazobactam (ZOSYN) IVPB 3.375 g (3.375 g Intravenous New Bag/Given 07/27/21 0848)  0.9 %  sodium chloride infusion (  has no administration in time range)    ____________________________________________   MDM / ED COURSE   60 year old male with multiple chronic medical comorbidities presents to the ED with hypoglycemia in the field, with evidence of sepsis due to draining abscess/hidradenitis suppurativa, requiring IV antibiotics and medical admission.  He is stable, but meeting sepsis criteria due to hypothermia and leukocytosis.  No evidence of septic shock.  Blood work with acute on chronic anemia, requiring transfusion of 1 unit PRBCs.  No evidence of intracranial pathology causing his lethargy, and likely a metabolic encephalopathy in the setting of his sepsis.  We will initiate IV antibiotics and discuss with medicine for admission.  Unlikely to need surgical intervention at this time considering his stability and site of infection is already draining without clear fluid collection on CT imaging.  Clinical Course as of 07/27/21 0902  Mon Jul 27, 2021  0749 Reexamined the patient with the assistance of nursing after he was undressed.  Has a purulent wound to right sided gluteal fold.  Discussed the possibility of sepsis with nursing.  Rectal temp is 94 degrees. [DS]  0825 Patient meeting sepsis criteria with his leukocytosis and hypothermia. [DS]  7953 Reassessed.  Clinically similar.  Wife at the bedside and I discussed work-up  with her, concern for sepsis and recommendation for medical admission and PRBC transfusion.  She is in agreement.  Answered questions. [DS]    Clinical Course User Index [DS] Vladimir Crofts, MD    ____________________________________________   FINAL CLINICAL IMPRESSION(S) / ED DIAGNOSES  Final diagnoses:  Sepsis with encephalopathy without septic shock, due to unspecified organism [facility])  Acute on chronic anemia  Hypoglycemia     ED Discharge Orders     None        Edlin Ford   Note:  This document was prepared using Dragon voice recognition software and may include unintentional dictation errors.    Vladimir Crofts, MD 07/27/21 [phone]

## 2021-07-27 NOTE — Progress Notes (Signed)
1330 Patient admitted to unit from ED; diagnosis hypoglycemia.  COVID positive. Presents to unit via stretcher A&Ox4. VSS.  NSR. O2 sat at 100% on RA.  Patient reports sacral discomfort 4/10. Repositioned on side.  Will administer Tylenol as ordered.  1430 Assessment and admission profile completed. Patient oriented to room, unit and call light.  Reviewed POC with pt and spouse. All questions answered with understanding verbalized.  Patient ordered meal by phone. Bed alarm activated and call light within reach.  Will continue to monitor.

## 2021-07-27 NOTE — ED Triage Notes (Signed)
Patient to [facility] via EMS from nsg facility ([facility]).  Per EMS nsg staff reported that patient had cbg of 37, they gave som glucagon and cbg up to 47.  EMS interventions -- IV via 20 g angiocath to right forearm, received 250 ml of normal saline, 1/2 amp D50 the cbg 166, hr 63- bp 169/77, pulse oxi 94%.  EMS states that nsg home staff reported that family visited yesterday and brought patient some sweets and that they were all gone.

## 2021-07-28 LAB — CBC
HCT: 21.7 % — ABNORMAL LOW (ref 39.0–52.0)
Hemoglobin: 6.9 g/dL — ABNORMAL LOW (ref 13.0–17.0)
MCH: 30.3 pg (ref 26.0–34.0)
MCHC: 31.8 g/dL (ref 30.0–36.0)
MCV: 95.2 fL (ref 80.0–100.0)
Platelets: 193 10*3/uL (ref 150–400)
RBC: 2.28 MIL/uL — ABNORMAL LOW (ref 4.22–5.81)
RDW: 18.5 % — ABNORMAL HIGH (ref 11.5–15.5)
WBC: 9.7 10*3/uL (ref 4.0–10.5)
nRBC: 0 % (ref 0.0–0.2)

## 2021-07-28 LAB — URINE CULTURE: Culture: NO GROWTH

## 2021-07-28 LAB — GLUCOSE, CAPILLARY
Glucose-Capillary: 198 mg/dL — ABNORMAL HIGH (ref 70–99)
Glucose-Capillary: 208 mg/dL — ABNORMAL HIGH (ref 70–99)
Glucose-Capillary: 294 mg/dL — ABNORMAL HIGH (ref 70–99)
Glucose-Capillary: 220 mg/dL — ABNORMAL HIGH (ref 70–99)

## 2021-07-28 LAB — BASIC METABOLIC PANEL
Anion gap: 11 (ref 5–15)
BUN: 43 mg/dL — ABNORMAL HIGH (ref 6–20)
CO2: 28 mmol/L (ref 22–32)
Calcium: 8.7 mg/dL — ABNORMAL LOW (ref 8.9–10.3)
Chloride: 96 mmol/L — ABNORMAL LOW (ref 98–111)
Creatinine, Ser: 3.37 mg/dL — ABNORMAL HIGH (ref 0.61–1.24)
GFR, Estimated: 20 mL/min — ABNORMAL LOW (ref >60)
Glucose, Bld: 252 mg/dL — ABNORMAL HIGH (ref 70–99)
Potassium: 4.3 mmol/L (ref 3.5–5.1)
Sodium: 135 mmol/L (ref 135–145)

## 2021-07-28 LAB — HEPATITIS B SURFACE ANTIBODY, QUANTITATIVE: Hep B S AB Quant (Post): 14.7 m[IU]/mL (ref >9.9)

## 2021-07-28 MED ORDER — RENA-VITE PO TABS
1.0000 | ORAL_TABLET | Freq: Every day | ORAL | Status: DC
  Administered 2021-07-28 – 2021-08-01 (×5): 1 via ORAL
  Filled 2021-07-28 (×6): qty 1

## 2021-07-28 MED ORDER — ACETAMINOPHEN 500 MG PO TABS
500.0000 mg | ORAL_TABLET | Freq: Four times a day (QID) | ORAL | Status: DC | PRN
  Administered 2021-07-30 – 2021-07-31 (×4): 500 mg via ORAL
  Filled 2021-07-28 (×4): qty 1

## 2021-07-28 MED ORDER — AMOXICILLIN-POT CLAVULANATE 500-125 MG PO TABS
1.0000 | ORAL_TABLET | Freq: Two times a day (BID) | ORAL | Status: AC
  Administered 2021-07-28 – 2021-07-31 (×7): 500 mg via ORAL
  Filled 2021-07-28 (×7): qty 1

## 2021-07-28 MED ORDER — INSULIN ASPART 100 UNIT/ML IJ SOLN
0.0000 [IU] | Freq: Three times a day (TID) | INTRAMUSCULAR | Status: DC
  Administered 2021-07-29 – 2021-07-30 (×2): 1 [IU] via SUBCUTANEOUS
  Administered 2021-07-30: 2 [IU] via SUBCUTANEOUS
  Administered 2021-07-30: 1 [IU] via SUBCUTANEOUS
  Administered 2021-07-31: 17:00:00 2 [IU] via SUBCUTANEOUS
  Administered 2021-07-31 (×2): 1 [IU] via SUBCUTANEOUS
  Administered 2021-08-01: 2 [IU] via SUBCUTANEOUS
  Administered 2021-08-01: 4 [IU] via SUBCUTANEOUS
  Administered 2021-08-01 – 2021-08-02 (×2): 2 [IU] via SUBCUTANEOUS
  Filled 2021-07-28 (×11): qty 1

## 2021-07-28 MED ORDER — GABAPENTIN 100 MG PO CAPS
100.0000 mg | ORAL_CAPSULE | Freq: Every day | ORAL | Status: DC
  Administered 2021-07-28 – 2021-08-01 (×5): 100 mg via ORAL
  Filled 2021-07-28 (×5): qty 1

## 2021-07-28 MED ORDER — ONDANSETRON HCL 4 MG/2ML IJ SOLN
4.0000 mg | Freq: Four times a day (QID) | INTRAMUSCULAR | Status: DC | PRN
  Administered 2021-07-28: 4 mg via INTRAVENOUS
  Filled 2021-07-28: qty 2

## 2021-07-28 NOTE — Progress Notes (Addendum)
[facility], [place] 07/28/21  Subjective:   Hospital day # 1  Patient seen resting in bed Alert and oriented Tolerating small meals Denies shortness of breath but has a non-productive cough Spoke with wife on telephone  Objective:  Vital signs in last 24 hours:  Temp:  [97.5 F (36.4 C)-98.7 F (37.1 C)] 98.5 F (36.9 C) (10/11 1202) Pulse Rate:  [63-76] 68 (10/11 1202) Resp:  [17-24] 17 (10/11 1202) BP: (143-182)/(57-84) 157/75 (10/11 1202) SpO2:  [92 %-100 %] 99 % (10/11 1202) Weight:  [61.5 kg-64.1 kg] 61.5 kg (10/10 1958)  Weight change: 0.596 kg Filed Weights   07/27/21 0653 07/27/21 1654 07/27/21 1958  Weight: 63.5 kg 64.1 kg 61.5 kg    Intake/Output:    Intake/Output Summary (Last 24 hours) at 07/28/2021 1203 Last data filed at 07/27/2021 2100 Gross per 24 hour  Intake 568.69 ml  Output 500 ml  Net 68.69 ml    Physical Exam: General:  No acute distress, laying in the bed  HEENT  anicteric, moist oral mucous membrane  Pulm/lungs  normal breathing effort, room air, clear  CVS/Heart  no rub or gallop  Abdomen:   Soft, nontender  Extremities:  No peripheral edema, b/l BKA  Neurologic:  Alert, oriented, able to follow commands  Skin:  No acute rashes, sacral wound  Left arm AVF  Basic Metabolic Panel:  Recent Labs  Lab 07/27/21 0723 07/28/21 0417  NA 135 135  K 4.4 4.3  CL 98 96*  CO2 29 28  GLUCOSE 142* 252*  BUN 58* 43*  CREATININE 5.24* 3.37*  CALCIUM 9.1 8.7*  MG 1.9  --       CBC: Recent Labs  Lab 07/27/21 0723 07/28/21 0417  WBC 16.6* 9.7  NEUTROABS 15.4*  --   HGB 6.0* 6.9*  HCT 20.0* 21.7*  MCV 98.0 95.2  PLT 224 193       Lab Results  Component Value Date   HEPBSAG NON REACTIVE 07/27/2021   HEPBSAB Reactive (A) 07/27/2021      Microbiology:  Recent Results (from the past 240 hour(s))  Urine Culture     Status: None   Collection Time: 07/27/21  7:24 AM   Specimen: In/Out Cath  Urine  Result Value Ref Range Status   Specimen Description   Final    IN/OUT CATH URINE Performed at [facility], [address], [place], [place] [address]    Special Requests   Final    NONE Performed at [facility], [address] [place], [place] [address]    Culture   Final    NO GROWTH Performed at [facility], [place] [address], [place], [place] [address]    Report Status 07/28/2021 FINAL  Final  Aerobic Culture w Gram Stain (superficial specimen)     Status: None (Preliminary result)   Collection Time: 07/27/21  7:48 AM   Specimen: Wound  Result Value Ref Range Status   Specimen Description   Final    WOUND Performed at [facility], [address], [place], [place] [address]    Special Requests   Final    NONE Performed at [facility], [address] [place], [place] [address]    Gram Stain   Final    FEW WBC PRESENT,BOTH PMN AND MONONUCLEAR RARE GRAM POSITIVE COCCI IN PAIRS    Culture   Final    CULTURE REINCUBATED FOR BETTER GROWTH Performed at [facility], [place] [address], [place], [place] [address]    Report Status PENDING  Incomplete  Resp Panel by RT-PCR (Flu A&B, Covid) Nasopharyngeal Swab     Status: Abnormal   Collection Time: 07/27/21  7:55 AM   Specimen: Nasopharyngeal Swab; Nasopharyngeal(NP) swabs in vial transport medium  Result Value Ref Range Status   SARS Coronavirus 2 by RT PCR POSITIVE (A) NEGATIVE Final    Comment: RESULT CALLED TO, READ BACK BY AND VERIFIED WITH: Ricke Hey, RN 0900 07/27/21 GM (NOTE) SARS-CoV-2 target nucleic acids are DETECTED.  The SARS-CoV-2 RNA is generally detectable in upper respiratory specimens during the acute phase of infection. Positive results are indicative of the presence of the identified virus, but do not rule out bacterial infection or co-infection with other pathogens not detected by the test. Clinical correlation with patient history and other  diagnostic information is necessary to determine patient infection status. The expected result is Negative.  Fact Sheet for Patients: EntrepreneurPulse.com.au  Fact Sheet for Healthcare Providers: IncredibleEmployment.be  This test is not yet approved or cleared by the [place] FDA and  has been authorized for detection and/or diagnosis of SARS-CoV-2 by FDA under an Emergency Use Authorization (EUA).  This EUA will remain in effect (meaning this test can be  used) for the duration of  the COVID-19 declaration under Section 564(b)(1) of the Act, 21 U.S.C. section 360bbb-3(b)(1), unless the authorization is terminated or revoked sooner.     Influenza A by PCR NEGATIVE NEGATIVE Final   Influenza B by PCR NEGATIVE NEGATIVE Final    Comment: (NOTE) The Xpert Xpress SARS-CoV-2/FLU/RSV plus assay is intended as an aid in the diagnosis of influenza from Nasopharyngeal swab specimens and should not be used as a sole basis for treatment. Nasal washings and aspirates are unacceptable for Xpert Xpress SARS-CoV-2/FLU/RSV testing.  Fact Sheet for Patients: EntrepreneurPulse.com.au  Fact Sheet for Healthcare Providers: IncredibleEmployment.be  This test is not yet approved or cleared by the [place] FDA and has been authorized for detection and/or diagnosis of SARS-CoV-2 by FDA under an Emergency Use Authorization (EUA). This EUA will remain in effect (meaning this test can be used) for the duration of the COVID-19 declaration under Section 564(b)(1) of the Act, 21 U.S.C. section 360bbb-3(b)(1), unless the authorization is terminated or revoked.  Performed at [facility], [address] [place], [place] [address]   Blood culture (routine single)     Status: None (Preliminary result)   Collection Time: 07/27/21  7:55 AM   Specimen: BLOOD  Result Value Ref Range Status   Specimen Description  BLOOD RIGHT ARM  Final   Special Requests   Final    BOTTLES DRAWN AEROBIC AND ANAEROBIC Blood Culture results may not be optimal due to an excessive volume of blood received in culture bottles   Culture   Final    NO GROWTH < 24 HOURS Performed at [facility], [address], [place], [place] [address]    Report Status PENDING  Incomplete    Coagulation Studies: Recent Labs    07/27/21 0723  LABPROT 14.8  INR 1.2     Urinalysis: Recent Labs    07/27/21 0724  COLORURINE YELLOW*  LABSPEC 1.011  PHURINE 7.0  GLUCOSEU 150*  HGBUR NEGATIVE  BILIRUBINUR NEGATIVE  KETONESUR NEGATIVE  PROTEINUR >=300*  NITRITE NEGATIVE  LEUKOCYTESUR NEGATIVE       Imaging: CT HEAD WO CONTRAST (5MM)  Result Date: 07/27/2021 CLINICAL DATA:  Mental status  change, unknown cause EXAM: CT HEAD WITHOUT CONTRAST TECHNIQUE: Contiguous axial images were obtained from the base of the skull through the vertex without intravenous contrast. COMPARISON:  06/09/2021 FINDINGS: Brain: There is no acute intracranial hemorrhage, mass effect, or edema. No new loss of gray-white differentiation. Encephalomalacia with areas of calcification again identified in the right frontal lobe and insula. Stable prominence of ventricles and sulci reflecting parenchymal volume loss. Stable patchy and confluent areas of low-attenuation in the cerebral white matter likely reflecting chronic microvascular ischemic changes. Vascular: There is atherosclerotic calcification at the skull base. Skull: Calvarium is unremarkable. Sinuses/Orbits: No acute finding. Other: Partially imaged suboccipital soft tissue density is also present on prior imaging. IMPRESSION: No acute intracranial abnormality. Stable chronic findings detailed above. Electronically Signed   By: Macy Mis M.D.   On: 07/27/2021 08:34   CT PELVIS WO CONTRAST  Result Date: 07/27/2021 CLINICAL DATA:  60 year old male purulent wound right buttock. EXAM: CT  PELVIS WITHOUT CONTRAST TECHNIQUE: Multidetector CT imaging of the pelvis was performed following the standard protocol without intravenous contrast. COMPARISON:  CT Abdomen and Pelvis 02/06/2021 CT pelvis with contrast 12/06/2017. FINDINGS: Urinary Tract: Negative noncontrast visible right kidney. No hydroureter is evident. Diminutive bladder. Bowel: Retained stool throughout the visible large bowel. No dilated large or small bowel loops. Vascular/Lymphatic: Diffuse severe calcified atherosclerosis throughout the visible abdomen and pelvis. Vascular patency is not evaluated in the absence of IV contrast. No lymphadenopathy is evident. Reproductive: Soft tissue wound evident along the right gluteal fold on series 2, image 53. Soft tissue wound in this same region present in 2019, but now appears larger since April. Regional soft tissue thickening, granulation tissue which is inseparable from the anal verge as well as the distal rectum. There is no tracking soft tissue gas. There is no fluid collection evident on this noncontrast exam. Other: Small volume ascites suspected about the liver (series 2, image 1). No definite pelvic free fluid. Paucity of visceral fat now in the abdomen and pelvis. Negative visible noncontrast liver and gallbladder. Musculoskeletal: Stable visualized osseous structures., including the sacrum and coccygeal segments since 2019. Chronic renal osteodystrophy suspected. IMPRESSION: 1. Chronic soft tissue wound of the right gluteal fold. This appears mildly larger since April and might directly involve the anus. But there is no tracking soft tissue, no fluid collection evident on this noncontrast exam, and no pelvic osteomyelitis. 2. Small volume ascites suspected about the visible liver. 3. Chronic renal osteodystrophy suspected. And very severe calcified atherosclerosis throughout the visible abdomen and pelvis. Electronically Signed   By: Genevie Ann M.D.   On: 07/27/2021 08:44   DG Chest  Portable 1 View  Result Date: 07/27/2021 CLINICAL DATA:  60 year old male with history of hypoglycemia. Dyspnea. Concern for aspiration. EXAM: PORTABLE CHEST 1 VIEW COMPARISON:  Chest x-ray 06/09/2021. FINDINGS: Vascular stents noted in the region of the left axillary vein and innominate vein. There is marked cephalization of the pulmonary vasculature, severe indistinctness of the interstitial markings, and extensive multifocal airspace disease throughout the lungs bilaterally (right greater than left) suggestive of severe pulmonary edema. Small right pleural effusion. No definite left pleural effusion. Heart size is moderately enlarged. Upper mediastinal contours are distorted by patient's rotation to the right. Atherosclerotic calcifications in the thoracic aorta. IMPRESSION: 1. The appearance the chest again suggests severe congestive heart failure, as above. 2. Given the slight asymmetry of the airspace disease, the possibility of superimposed right-sided bronchopneumonia is not excluded, particularly in light of the asymmetric  right-sided pleural effusion. 3. Aortic atherosclerosis. Electronically Signed   By: Vinnie Langton M.D.   On: 07/27/2021 07:36     Medications:    piperacillin-tazobactam (ZOSYN)  IV 2.25 g (07/28/21 0904)    aspirin  81 mg Oral Daily   atorvastatin  80 mg Oral Daily   carvedilol  25 mg Oral BID   Chlorhexidine Gluconate Cloth  6 each Topical Q0600   [START ON 07/29/2021] epoetin (EPOGEN/PROCRIT) injection  4,000 Units Intravenous Q M,W,F-HD   feeding supplement  237 mL Oral TID BM   gabapentin  300 mg Oral QHS   influenza vac split quadrivalent PF  0.5 mL Intramuscular Tomorrow-1000   levETIRAcetam  750 mg Oral Q1200   lisinopril  40 mg Oral Daily   multivitamin with minerals   Oral Daily   sevelamer carbonate  800 mg Oral TID WC   acetaminophen, albuterol, dextromethorphan-guaiFENesin, dextrose, hydrALAZINE, LORazepam  Assessment/ Plan:  60 y.o. male with   with end stage renal disease on hemodialysis, diabetes mellitus type II, hypertension, CVA, peripheral vascular disease status post bilateral BKA    admitted on 07/27/2021 for Hypoglycemia [E16.2] Sepsis ([place]) [A41.9] Acute on chronic anemia [D64.9] Sepsis with encephalopathy without septic shock, due to unspecified organism ([place]) [A41.9, R65.20, G93.40]  # ESRD # Secondary hyperparathyroidism of renal origin N 25.81  # Anemia of CKD # Covid -19 positive # Aspiration Pneumonia  BP and volume are acceptable; Received dialysis yesterday, tolerated well Receiving EPO with dialysis treatments. Received 1 unit RBCs yesterday. Hgb 6.9 today. May consider an additional transfusion. Will defer to primary team.  Continue sevelamer with meals Continue Zosyn    LOS: [place] 10/11/202212:03 PM  [place], [place]

## 2021-07-28 NOTE — Consult Note (Signed)
[facility] Nurse Consult Note: Patient receiving care in [facility]. Patient is COVID +. Reason for Consult: sacral wound Wound type: pressure injury, stage  3 Pressure Injury POA: Yes Measurement: To be provided by the bedside RN in the flowsheet section  Wound bed: pink, see photo, epibole present Drainage (amount, consistency, odor) To be provided by the bedside RN in the flowsheet section  Periwound: intact Dressing procedure/placement/frequency: Place a size appropriate piece of Xeroform Kellie Simmering [id number]) into the sacral wound, cover with a foam dressing. Change daily.  Monitor the wound area(s) for worsening of condition such as: Signs/symptoms of infection,  Increase in size,  Development of or worsening of odor, Development of pain, or increased pain at the affected locations.  Notify the medical team if any of these develop.  Thank you for the consult.  [place] nurse will not follow at this time.  Please re-consult the [place] team if needed.  Val Riles, RN, MSN, CWOCN, CNS-BC, pager [phone]

## 2021-07-28 NOTE — TOC Initial Note (Signed)
Transition of Care [facility]) - Initial/Assessment Note    Patient Details  Name: [name] MRN: [id number] Date of Birth: 08-Mar-1961  [facility] of [facility]) CM/SW Contact:    Pete Pelt, RN Phone Number: 07/28/2021, 2:36 PM  Clinical Narrative:   Patient is a resident of [facility], as per D. Doyle Askew, patient can return to facility when medically appropriate.  TOC contact information given, TOC to follow to discharge.                Expected Discharge Plan: [place] (Patient is a resident of [facility]) Barriers to Discharge: Continued Medical Work up   Patient Goals and CMS Choice     Choice offered to / list presented to : NA (Patient is a resident of [facility], verified by D. Doyle Askew)  Expected Discharge Plan and Services Expected Discharge Plan: [place] (Patient is a resident of [facility]) In-house Referral: NA Discharge Planning Services: CM Consult Post Acute Care Choice: Long Term Acute Care (LTAC) Living arrangements for the past 2 months: [place]                                      Prior Living Arrangements/Services Living arrangements for the past 2 months: [place] Lives with:: Facility Resident              Current home services: Other (comment) (Facility resident)    Activities of Daily Living Home Assistive Devices/Equipment: None (prosthetics BLE) ADL Screening (condition at time of admission) Patient's cognitive ability adequate to safely complete daily activities?: Yes Is the patient deaf or have difficulty hearing?: No Does the patient have difficulty seeing, even when wearing glasses/contacts?: No Does the patient have difficulty concentrating, remembering, or making decisions?: No Patient able to express need for assistance with ADLs?: Yes Does the patient have difficulty dressing or bathing?: Yes Independently performs ADLs?: No Communication:  Independent Dressing (OT): Needs assistance Is this a change from baseline?: Pre-admission baseline Grooming: Needs assistance Is this a change from baseline?: Pre-admission baseline Feeding: Independent Bathing: Needs assistance Is this a change from baseline?: Pre-admission baseline Toileting: Needs assistance Is this a change from baseline?: Pre-admission baseline In/Out Bed: Needs assistance Is this a change from baseline?: Pre-admission baseline Walks in Home: Needs assistance Is this a change from baseline?: Pre-admission baseline Does the patient have difficulty walking or climbing stairs?: Yes ([place]il[place] AKA) Weakness of Legs: Right (B8ilat AKA) Weakness of Arms/Hands: Left  Permission Sought/Granted                  Emotional Assessment Appearance:: Appears stated age Attitude/Demeanor/Rapport: Gracious Affect (typically observed): Pleasant   Alcohol / Substance Use: Not Applicable Psych Involvement: No (comment)  Admission diagnosis:  Hypoglycemia [E16.2] Sepsis ([place]) [A41.9] Acute on chronic anemia [D64.9] Sepsis with encephalopathy without septic shock, due to unspecified organism ([place]) [A41.9, R65.20, G93.40] Patient Active Problem List   Diagnosis Date Noted   Type II diabetes mellitus with renal manifestations ([place]) 07/27/2021   Anemia in ESRD (end-stage renal disease) ([place]) 07/27/2021   Hypoglycemia 07/27/2021   Hypothermia 07/27/2021   COVID-19 virus infection 07/27/2021   Aspiration pneumonia ([place]) 07/27/2021   Unstageable pressure ulcer of sacral region ([place]) 05/22/2021   Acute decompensated heart failure ([place]) 05/19/2021   Acute respiratory failure with hypoxia ([place]) 05/18/2021   Protein-calorie malnutrition,  severe 05/14/2021   Acute pulmonary edema (HCC)    Shortness of breath    Fluid overload 05/12/2021   Cellulitis and abscess of buttock    History of GI bleed 02/06/2021   Long term current use of immunosuppressive drug 02/06/2021    Disorder of skin due to Crohn's disease ([place]) 44/10/270   Complication of vascular access for dialysis 01/22/2021   Polyp of transverse colon    Acute metabolic encephalopathy 53/66/4403   Acute on chronic anemia 47/42/5956   Chronic systolic CHF (congestive heart failure) ([place]) 11/18/2020   Cerebrovascular disease 09/12/2020   Transient alteration of awareness 09/12/2020   Hyperkalemia 09/12/2020   Mild cognitive impairment 09/30/2019   Peripheral vascular disease ([place]) 06/28/2018   Sepsis ([place]) 01/12/2018   Pressure injury of skin 12/04/2017   S/P bilateral BKA (below knee amputation) ([place]) 10/20/2017   Atherosclerosis of native arteries of extremity with rest pain ([place]) 08/05/2017   History of CVA (cerebrovascular accident) 04/15/2017   Seizure disorder ([place]) 04/15/2017   End stage renal failure on dialysis ([place]) 04/12/2016   Aphthae 02/20/2016   Hidradenitis suppurativa 02/20/2016   Leg pain 02/20/2016   Neuropathy 02/20/2016   Narrowing of intervertebral disc space 08/29/2015   Vascular disorder of lower extremity 08/29/2015   Failure of erection 08/29/2015   Hypercholesteremia 08/29/2015   Hypertension 08/29/2015   Anemia due to chronic kidney disease 05/26/2015   Venous insufficiency of leg 09/04/2014   History of deep vein thrombosis (DVT) of lower extremity 08/16/2014   Prostatic intraepithelial neoplasia 11/02/2013   Elevated prostate specific antigen (PSA) 09/11/2013   Benign prostatic hyperplasia with urinary obstruction 08/13/2013   Spermatocele 08/13/2013   Avitaminosis D 01/25/2013   Type 2 diabetes mellitus with hypoglycemia without coma ([place]) 03/28/2012   Crohn disease ([place]) 08/03/2011   PCP:  [name], MD Pharmacy:   [facility] [place], [address] [place] [address] Phone: [phone] Fax: [phone]  [place], [place] [address] Phone: [phone] Fax: [phone]     Social Determinants of Health (SDOH) Interventions    Readmission Risk Interventions Readmission Risk Prevention Plan 07/28/2021 02/10/2021 02/08/2021  Transportation Screening Complete Complete Complete  PCP or Specialist Appt within 5-7 Days - - Complete  Home Care Screening - - Complete  Medication Review (RN CM) - - Complete  HRI or Home Care Consult - Complete -  Palliative Care Screening - Complete -  Medication Review (RN Care Manager) Complete Complete -  PCP or Specialist appointment within 3-5 days of discharge Complete - -  [place] or [place] (No Data) - -  Palliative Care Screening Not Applicable - -  [place] Not Applicable - -  Some recent data might be hidden

## 2021-07-28 NOTE — Progress Notes (Signed)
Initial Nutrition Assessment  DOCUMENTATION CODES:  Severe malnutrition in context of chronic illness  INTERVENTION:  Liberalize diet to carb modified  Continue Ensure Enlive po TID, each supplement provides 350 kcal and 20 grams of protein Renavite daily for HD losses  NUTRITION DIAGNOSIS:  Severe Malnutrition related to chronic illness (ESRD on HD, CHF) as evidenced by severe fat depletion, severe muscle depletion.  GOAL:  Patient will meet greater than or equal to 90% of their needs  MONITOR:  PO intake, Supplement acceptance, Labs  REASON FOR ASSESSMENT:  Malnutrition Screening Tool    ASSESSMENT:  60 y.o. male with history of ESRD-HD (MWF), HTN, HLD, DM, CVA, ICH, DVT, s/p for bilateral BKA, Crohn's disease, PVD, mild cognitive impairment, and CHF (EF 30-35%), presented to ED from SNF with AMS and cough. Found to have significant hypoglycemia, mental status improved with treatment.   Pt resting in bed at the time of assessment. Lunch try in front of pt, untouched. Pt reports he is not hungry right now, but does plan to eat in a little while. States that he ate well at breakfast. Pt does not think that he has lost weight recently. Muscle and fat deficits noted during previous admission - still seen on exam today.  As pt is not eating well and has malnutrition, will liberalize diet to carb modified to allow for more dining choices. Received HD yesterday prior to coming to room.  Discussed pt in rounds, will likely be stable for dc either tomorrow or the day after.   Nutritionally Relevant Medications: Scheduled Meds:  atorvastatin  80 mg Oral Daily   feeding supplement  237 mL Oral TID BM   multivitamin with minerals   Oral Daily   sevelamer carbonate  800 mg Oral TID WC   Continuous Infusions:  piperacillin-tazobactam (ZOSYN)  IV 2.25 g (07/28/21 0904)   Labs Reviewed: BUN 43 / creatinine 3.37 SBG ranges from 121-208 mg/dL over the last 24 hours HgbA1c 6.0%  (8/1)  NUTRITION - FOCUSED PHYSICAL EXAM: Flowsheet Row Most Recent Value  Orbital Region Moderate depletion  Upper Arm Region Severe depletion  Thoracic and Lumbar Region Severe depletion  Buccal Region Moderate depletion  Temple Region Moderate depletion  Clavicle Bone Region Moderate depletion  Clavicle and Acromion Bone Region Severe depletion  Scapular Bone Region Severe depletion  Dorsal Hand Severe depletion  Patellar Region Severe depletion  Anterior Thigh Region Severe depletion  Posterior Calf Region Unable to assess  [bilateral BKA]  Edema (RD Assessment) None  Hair Reviewed  Eyes Reviewed  Mouth Reviewed  Skin Reviewed  Nails Reviewed   Diet Order:   Diet Order             Diet Carb Modified Fluid consistency: Thin; Room service appropriate? Yes; Fluid restriction: 2000 mL Fluid  Diet effective now                  EDUCATION NEEDS:  No education needs have been identified at this time  Skin:  Skin Assessment: Skin Integrity Issues: Skin Integrity Issues:: Stage II Stage II: sacrum  Last BM:  10/10  Height:  Ht Readings from Last 1 Encounters:  07/28/21 5' 11"  (1.803 m)   Weight:  Wt Readings from Last 1 Encounters:  07/27/21 61.5 kg    Ideal Body Weight:  69 kg (adjusted by 11.8% for bilateral BKA)  BMI:  Body mass index is 21.5 kg/m. adjusted by 11.8% for bilateral BKA using 61.5kg wt  Estimated Nutritional  Needs:  Kcal:  1800-2000 kcal/d Protein:  90-100 g/d Fluid:  1L + UOP   Ranell Patrick, RD, LDN Clinical Dietitian RD pager # available in [facility]  After hours/weekend pager # available in [facility]

## 2021-07-28 NOTE — Progress Notes (Signed)
Quintan Saldivar Schrag  HQI:[id number] DOB: Feb 28, 1961 DOA: 07/27/2021 PCP: [name], MD    Brief Narrative:  [phone] with a history of ESRD on HD MWF, hidradenitis suppurativa on [name], HTN, HLD, DM2, CVA, ICH, DVT, B BKA, Crohn's disease, anemia of chronic kidney disease, seizure disorder, mild cognitive impairment, systolic congestive heart failure with EF 30-35%, and BPH who presented to the ER with altered mental status.  Of note patient reportedly had a positive COVID test 07/06/21 (?home test), but at that time did not have significant symptoms.  The day of his admission he was found to be confused and somnolent by his wife.  CBG was noted to be 37.  He was treated with glucagon and D50 by EMS and his CBG rapidly improved.  By the time he arrived in the ER he was waking up.  CT head was negative for acute process.  CXR suggested pulmonary edema and possible right infiltrate.  Exam in the ED noted a sacral ulcer.  Date of Positive COVID Test:  07/06/21 (?home test) and 07/27/21 (in ED)  Consultants:  None  Code Status: FULL CODE  Antimicrobials:  Zosyn 10/10 >  DVT prophylaxis: None due to severe anemia and bilateral BKA  Subjective: At the time of my visit the patient is alert and conversant.  He states he feels tired in general.  He is able to tell me where he is and why he is here.  He denies shortness of breath or chest pain.  No nausea or vomiting.  He is expectorating a significant amount of thick sputum.  Assessment & Plan:  Suspected aspiration pneumonia - sepsis Elevated WBC, hypothermia, and suspected bacterial pneumonia at time of presentation - continue empiric antibiotic for short course  Acute metabolic encephalopathy  CT head unrevealing -likely simply due to hypoglycemia which then in turn led to aspiration  DM2 uncontrolled with renal failure and severe hypoglycemia Unclear what initially led to the patient's hypoglycemia but this likely set off the events that  ultimately led to AMS and resultant aspiration and the need for hospitalization - monitor CBG trend closely -avoid insulin for now  HLD Continue Lipitor  HTN Continue usual home medications  ESRD on HD MWF Nephrology has been alerted and will cont his HD tx   Seizure disorder Continue usual seizure medications  PVD status post bilateral BKA  Chronic systolic CHF -EF 40-10% Volume management per HD -no evidence of acute volume overload presently  Unstageable sacral pressure ulcer POA Follow wound care recommendations  Anemia of CKD Transfuse during dialysis as indicated  Hypothermia Felt to be related to severe hypoglycemia -currently normothermic  COVID infection Suspect this represents a persisting positive test despite lack of an active infectious infection - when patient is ready for d/c disposition we can ask the lab to give [place] the cycle threshold value to determine if isolation precautions can be discontinued before 10 days are completed    Family Communication: No family present at time of exam Status is: Inpatient  Remains inpatient appropriate because:Inpatient level of care appropriate due to severity of illness  Dispo: The patient is from: Home              Anticipated d/c is to:  Unclear              Patient currently is not medically stable to d/c.   Difficult to place patient No    Objective: Blood pressure (!) 167/74, pulse 63, temperature 98.2 F (  36.8 C), resp. rate 20, height 5' 11"  (1.803 m), weight 61.5 kg, SpO2 97 %.  Intake/Output Summary (Last 24 hours) at 07/28/2021 1102 Last data filed at 07/27/2021 2100 Gross per 24 hour  Intake 568.69 ml  Output 500 ml  Net 68.69 ml   Filed Weights   07/27/21 0653 07/27/21 1654 07/27/21 1958  Weight: 63.5 kg 64.1 kg 61.5 kg    Examination: General: No acute respiratory distress Lungs: Mild bibasilar crackles without wheezing Cardiovascular: Regular rate and rhythm without murmur  Abdomen:  Nontender, nondistended, soft, bowel sounds positive, no rebound, no ascites, no appreciable mass Extremities: No significant cyanosis, clubbing, or edema of B LE remnants   CBC: Recent Labs  Lab 07/27/21 0723 07/28/21 0417  WBC 16.6* 9.7  NEUTROABS 15.4*  --   HGB 6.0* 6.9*  HCT 20.0* 21.7*  MCV 98.0 95.2  PLT 224 161   Basic Metabolic Panel: Recent Labs  Lab 07/27/21 0723 07/28/21 0417  NA 135 135  K 4.4 4.3  CL 98 96*  CO2 29 28  GLUCOSE 142* 252*  BUN 58* 43*  CREATININE 5.24* 3.37*  CALCIUM 9.1 8.7*  MG 1.9  --    GFR: Estimated Creatinine Clearance: 20.5 mL/min (A) (by C-G formula based on SCr of 3.37 mg/dL (H)).  Liver Function Tests: Recent Labs  Lab 07/27/21 0723  AST 20  ALT 22  ALKPHOS 94  BILITOT 0.6  PROT 7.0  ALBUMIN 2.3*   Recent Labs  Lab 07/27/21 0723  LIPASE 31    Coagulation Profile: Recent Labs  Lab 07/27/21 0723  INR 1.2    HbA1C: Hemoglobin A1C  Date/Time Value Ref Range Status  07/24/2014 03:24 PM 8.3 (H) 4.2 - 6.3 % Final    Comment:    The American Diabetes Association recommends that a primary goal of therapy should be <7% and that physicians should reevaluate the treatment regimen in patients with HbA1c values consistently >8%.    Hgb A1c MFr Bld  Date/Time Value Ref Range Status  05/18/2021 05:47 AM 6.0 (H) 4.8 - 5.6 % Final    Comment:    (NOTE) Pre diabetes:          5.7%-6.4%  Diabetes:              >6.4%  Glycemic control for   <7.0% adults with diabetes   02/06/2021 05:56 PM 6.3 (H) 4.8 - 5.6 % Final    Comment:    (NOTE) Pre diabetes:          5.7%-6.4%  Diabetes:              >6.4%  Glycemic control for   <7.0% adults with diabetes     CBG: Recent Labs  Lab 07/27/21 1110 07/27/21 1233 07/27/21 1556 07/27/21 2120 07/28/21 0834  GLUCAP 133* 139* 155* 121* 198*    Recent Results (from the past 240 hour(s))  Urine Culture     Status: None   Collection Time: 07/27/21  7:24 AM    Specimen: In/Out Cath Urine  Result Value Ref Range Status   Specimen Description   Final    IN/OUT CATH URINE Performed at [facility], [address], [place], [place] [address]    Special Requests   Final    NONE Performed at [facility], [address], [place], [place] [address]    Culture   Final    NO GROWTH Performed at [facility], [place] [address],  [place], [place] [address]    Report Status 07/28/2021 FINAL  Final  Aerobic Culture w Gram Stain (superficial specimen)     Status: None (Preliminary result)   Collection Time: 07/27/21  7:48 AM   Specimen: Wound  Result Value Ref Range Status   Specimen Description   Final    WOUND Performed at [facility], [address] [place], [place] [address]    Special Requests   Final    NONE Performed at [facility], [address] [place], [place] [address]    Gram Stain   Final    FEW WBC PRESENT,BOTH PMN AND MONONUCLEAR RARE GRAM POSITIVE COCCI IN PAIRS    Culture   Final    CULTURE REINCUBATED FOR BETTER GROWTH Performed at [facility], [place] [address], [place], [place] [address]    Report Status PENDING  Incomplete  Resp Panel by RT-PCR (Flu A&B, Covid) Nasopharyngeal Swab     Status: Abnormal   Collection Time: 07/27/21  7:55 AM   Specimen: Nasopharyngeal Swab; Nasopharyngeal(NP) swabs in vial transport medium  Result Value Ref Range Status   SARS Coronavirus 2 by RT PCR POSITIVE (A) NEGATIVE Final    Comment: RESULT CALLED TO, READ BACK BY AND VERIFIED WITH: Ricke Hey, RN 0900 07/27/21 GM (NOTE) SARS-CoV-2 target nucleic acids are DETECTED.  The SARS-CoV-2 RNA is generally detectable in upper respiratory specimens during the acute phase of infection. Positive results are indicative of the presence of the identified virus, but do not rule out bacterial infection or co-infection with other pathogens not detected by the test. Clinical correlation with  patient history and other diagnostic information is necessary to determine patient infection status. The expected result is Negative.  Fact Sheet for Patients: EntrepreneurPulse.com.au  Fact Sheet for Healthcare Providers: IncredibleEmployment.be  This test is not yet approved or cleared by the [place] FDA and  has been authorized for detection and/or diagnosis of SARS-CoV-2 by FDA under an Emergency Use Authorization (EUA).  This EUA will remain in effect (meaning this test can be  used) for the duration of  the COVID-19 declaration under Section 564(b)(1) of the Act, 21 U.S.C. section 360bbb-3(b)(1), unless the authorization is terminated or revoked sooner.     Influenza A by PCR NEGATIVE NEGATIVE Final   Influenza B by PCR NEGATIVE NEGATIVE Final    Comment: (NOTE) The Xpert Xpress SARS-CoV-2/FLU/RSV plus assay is intended as an aid in the diagnosis of influenza from Nasopharyngeal swab specimens and should not be used as a sole basis for treatment. Nasal washings and aspirates are unacceptable for Xpert Xpress SARS-CoV-2/FLU/RSV testing.  Fact Sheet for Patients: EntrepreneurPulse.com.au  Fact Sheet for Healthcare Providers: IncredibleEmployment.be  This test is not yet approved or cleared by the [place] FDA and has been authorized for detection and/or diagnosis of SARS-CoV-2 by FDA under an Emergency Use Authorization (EUA). This EUA will remain in effect (meaning this test can be used) for the duration of the COVID-19 declaration under Section 564(b)(1) of the Act, 21 U.S.C. section 360bbb-3(b)(1), unless the authorization is terminated or revoked.  Performed at [facility], [address] [place], [place] [address]   Blood culture (routine single)     Status: None (Preliminary result)   Collection Time: 07/27/21  7:55 AM   Specimen: BLOOD  Result Value Ref Range Status    Specimen Description BLOOD RIGHT ARM  Final   Special Requests   Final    BOTTLES DRAWN AEROBIC  AND ANAEROBIC Blood Culture results may not be optimal due to an excessive volume of blood received in culture bottles   Culture   Final    NO GROWTH < 24 HOURS Performed at [facility], [address] [place], [place] [address]    Report Status PENDING  Incomplete     Scheduled Meds:  aspirin  81 mg Oral Daily   atorvastatin  80 mg Oral Daily   carvedilol  25 mg Oral BID   Chlorhexidine Gluconate Cloth  6 each Topical Q0600   [START ON 07/29/2021] epoetin (EPOGEN/PROCRIT) injection  4,000 Units Intravenous Q M,W,F-HD   feeding supplement  237 mL Oral TID BM   gabapentin  300 mg Oral QHS   influenza vac split quadrivalent PF  0.5 mL Intramuscular Tomorrow-1000   levETIRAcetam  750 mg Oral Q1200   lisinopril  40 mg Oral Daily   multivitamin with minerals   Oral Daily   sevelamer carbonate  800 mg Oral TID WC   Continuous Infusions:  piperacillin-tazobactam (ZOSYN)  IV 2.25 g (07/28/21 0904)     LOS: 1 day   Cherene Altes, MD [facility] Office  [phone] Pager - Text Page per Shea Evans  If 7PM-7AM, please contact night-coverage per Amion 07/28/2021, 11:02 AM

## 2021-07-29 ENCOUNTER — Ambulatory Visit (INDEPENDENT_AMBULATORY_CARE_PROVIDER_SITE_OTHER): Payer: Medicare Other

## 2021-07-29 DIAGNOSIS — J690 Pneumonitis due to inhalation of food and vomit: Secondary | ICD-10-CM

## 2021-07-29 DIAGNOSIS — I5022 Chronic systolic (congestive) heart failure: Secondary | ICD-10-CM

## 2021-07-29 DIAGNOSIS — E1129 Type 2 diabetes mellitus with other diabetic kidney complication: Secondary | ICD-10-CM

## 2021-07-29 DIAGNOSIS — Z992 Dependence on renal dialysis: Secondary | ICD-10-CM

## 2021-07-29 DIAGNOSIS — N186 End stage renal disease: Secondary | ICD-10-CM

## 2021-07-29 LAB — CBC
HCT: 20.3 % — ABNORMAL LOW (ref 39.0–52.0)
HCT: 25.2 % — ABNORMAL LOW (ref 39.0–52.0)
Hemoglobin: 6.2 g/dL — ABNORMAL LOW (ref 13.0–17.0)
Hemoglobin: 7.8 g/dL — ABNORMAL LOW (ref 13.0–17.0)
MCH: 29.5 pg (ref 26.0–34.0)
MCH: 29.2 pg (ref 26.0–34.0)
MCHC: 30.5 g/dL (ref 30.0–36.0)
MCHC: 31 g/dL (ref 30.0–36.0)
MCV: 96.7 fL (ref 80.0–100.0)
MCV: 94.4 fL (ref 80.0–100.0)
Platelets: 171 10*3/uL (ref 150–400)
Platelets: 164 10*3/uL (ref 150–400)
RBC: 2.1 MIL/uL — ABNORMAL LOW (ref 4.22–5.81)
RBC: 2.67 MIL/uL — ABNORMAL LOW (ref 4.22–5.81)
RDW: 17.6 % — ABNORMAL HIGH (ref 11.5–15.5)
RDW: 19 % — ABNORMAL HIGH (ref 11.5–15.5)
WBC: 8.8 10*3/uL (ref 4.0–10.5)
WBC: 9.2 10*3/uL (ref 4.0–10.5)
nRBC: 0 % (ref 0.0–0.2)
nRBC: 0 % (ref 0.0–0.2)

## 2021-07-29 LAB — GLUCOSE, CAPILLARY
Glucose-Capillary: 115 mg/dL — ABNORMAL HIGH (ref 70–99)
Glucose-Capillary: 110 mg/dL — ABNORMAL HIGH (ref 70–99)
Glucose-Capillary: 124 mg/dL — ABNORMAL HIGH (ref 70–99)
Glucose-Capillary: 195 mg/dL — ABNORMAL HIGH (ref 70–99)
Glucose-Capillary: 225 mg/dL — ABNORMAL HIGH (ref 70–99)

## 2021-07-29 LAB — PREPARE RBC (CROSSMATCH)

## 2021-07-29 MED ORDER — PANTOPRAZOLE SODIUM 40 MG IV SOLR
40.0000 mg | Freq: Two times a day (BID) | INTRAVENOUS | Status: DC
  Administered 2021-07-29 – 2021-07-31 (×4): 40 mg via INTRAVENOUS
  Filled 2021-07-29 (×4): qty 40

## 2021-07-29 MED ORDER — SODIUM CHLORIDE 0.9% IV SOLUTION: Freq: Once | INTRAVENOUS | Status: AC

## 2021-07-29 MED ORDER — HYDRALAZINE HCL 50 MG PO TABS
100.0000 mg | ORAL_TABLET | Freq: Three times a day (TID) | ORAL | Status: DC
  Administered 2021-07-29 – 2021-08-02 (×12): 100 mg via ORAL
  Filled 2021-07-29 (×13): qty 2

## 2021-07-29 NOTE — Progress Notes (Addendum)
[name]  LZJ:[id number] DOB: January 13, 1961 DOA: 07/27/2021 PCP: [name], MD    Brief Narrative:  [phone] with a history of ESRD on HD MWF, hidradenitis suppurativa on [name], HTN, HLD, DM2, CVA, ICH, DVT, B BKA, Crohn's disease, anemia of chronic kidney disease, seizure disorder, mild cognitive impairment, systolic congestive heart failure with EF 30-35%, and BPH who presented to the ER with altered mental status.  Of note patient reportedly had a positive COVID test 07/06/21 (?home test), but at that time did not have significant symptoms.  The day of his admission he was found to be confused and somnolent by his wife.  CBG was noted to be 37.  He was treated with glucagon and D50 by EMS and his CBG rapidly improved.  By the time he arrived in the ER he was waking up.  CT head was negative for acute process.  CXR suggested pulmonary edema and possible right infiltrate.  Exam in the ED noted a sacral ulcer.  Date of Positive COVID Test:  07/06/21 (?home test) and 07/27/21 (in ED)  Consultants:  None  Code Status: FULL CODE  Antimicrobials:  Zosyn 10/10 >10/11, Augmentin 10/11>  DVT prophylaxis: None due to severe anemia and bilateral BKA  Subjective: At the time of my visit the patient is alert and conversant.  Says feeling fine. Tolerated dialysis. No chest pain. No dyspnea or cough. Tolerating diet.  Assessment & Plan:  Suspected aspiration pneumonia - sepsis Elevated WBC, hypothermia, and suspected bacterial pneumonia at time of presentation - continue empiric antibiotic for short course. Abx started 10/10. Continuing augmentin  Acute metabolic encephalopathy  CT head unrevealing -likely simply due to hypoglycemia which then in turn led to aspiration. Appears to be back to baseline  DM2 uncontrolled with renal failure and severe hypoglycemia Unclear what initially led to the patient's hypoglycemia but this likely set off the events that ultimately led to AMS and  resultant aspiration and the need for hospitalization - monitor CBG trend closely -avoid insulin for now. CGBs wnl last 24 hrs  HLD Continue Lipitor  HTN Continue usual home medications  ESRD on HD MWF Nephrology has been alerted and will cont his HD tx   Seizure disorder Continue usual seizure medications  PVD status post bilateral BKA  Chronic systolic CHF -EF 97-35% Volume management per HD -no evidence of acute volume overload presently  Unstageable sacral pressure ulcer POA Follow wound care recommendations. CT neg for osteo/abscess  Anemia of CKD Hgb 6s today. No bleeding. Receiving epo w/ dialysis - will transfuse one unit  Hypothermia Felt to be related to severe hypoglycemia -currently normothermic  COVID infection Suspect this represents a persisting positive test despite lack of an active infectious infection    Family Communication: wife updated telephonically 10/12 Status is: Inpatient  Remains inpatient appropriate because:Inpatient level of care appropriate due to severity of illness  Dispo: The patient is from: Home              Anticipated d/c is to: tomorrow              Patient currently is not medically stable to d/c.   Difficult to place patient No    Objective: Blood pressure (!) 160/77, pulse 68, temperature 98.4 F (36.9 C), resp. rate 16, height 5' 11"  (1.803 m), weight 64 kg, SpO2 97 %.  Intake/Output Summary (Last 24 hours) at 07/29/2021 1425 Last data filed at 07/29/2021 1300 Gross per 24 hour  Intake 237  ml  Output 500 ml  Net -263 ml   Filed Weights   07/29/21 0500 07/29/21 0944 07/29/21 1300  Weight: 64.4 kg 64.3 kg 64 kg    Examination: General: No acute respiratory distress Lungs: Mild bibasilar crackles without wheezing Cardiovascular: Regular rate and rhythm without murmur  Abdomen: Nontender, nondistended, soft, bowel sounds positive, no rebound, no ascites, no appreciable mass Extremities: No significant cyanosis,  clubbing, or edema of B LE remnants   CBC: Recent Labs  Lab 07/27/21 0723 07/28/21 0417 07/29/21 0502  WBC 16.6* 9.7 8.8  NEUTROABS 15.4*  --   --   HGB 6.0* 6.9* 6.2*  HCT 20.0* 21.7* 20.3*  MCV 98.0 95.2 96.7  PLT 224 193 809   Basic Metabolic Panel: Recent Labs  Lab 07/27/21 0723 07/28/21 0417  NA 135 135  K 4.4 4.3  CL 98 96*  CO2 29 28  GLUCOSE 142* 252*  BUN 58* 43*  CREATININE 5.24* 3.37*  CALCIUM 9.1 8.7*  MG 1.9  --    GFR: Estimated Creatinine Clearance: 21.4 mL/min (A) (by C-G formula based on SCr of 3.37 mg/dL (H)).  Liver Function Tests: Recent Labs  Lab 07/27/21 0723  AST 20  ALT 22  ALKPHOS 94  BILITOT 0.6  PROT 7.0  ALBUMIN 2.3*   Recent Labs  Lab 07/27/21 0723  LIPASE 31    Coagulation Profile: Recent Labs  Lab 07/27/21 0723  INR 1.2    HbA1C: Hemoglobin A1C  Date/Time Value Ref Range Status  07/24/2014 03:24 PM 8.3 (H) 4.2 - 6.3 % Final    Comment:    The American Diabetes Association recommends that a primary goal of therapy should be <7% and that physicians should reevaluate the treatment regimen in patients with HbA1c values consistently >8%.    Hgb A1c MFr Bld  Date/Time Value Ref Range Status  05/18/2021 05:47 AM 6.0 (H) 4.8 - 5.6 % Final    Comment:    (NOTE) Pre diabetes:          5.7%-6.4%  Diabetes:              >6.4%  Glycemic control for   <7.0% adults with diabetes   02/06/2021 05:56 PM 6.3 (H) 4.8 - 5.6 % Final    Comment:    (NOTE) Pre diabetes:          5.7%-6.4%  Diabetes:              >6.4%  Glycemic control for   <7.0% adults with diabetes     CBG: Recent Labs  Lab 07/28/21 1205 07/28/21 1539 07/28/21 2125 07/29/21 0825 07/29/21 0928  GLUCAP 208* 294* 220* 115* 110*    Recent Results (from the past 240 hour(s))  Urine Culture     Status: None   Collection Time: 07/27/21  7:24 AM   Specimen: In/Out Cath Urine  Result Value Ref Range Status   Specimen Description   Final     IN/OUT CATH URINE Performed at [facility], [address], [place], [place] [address]    Special Requests   Final    NONE Performed at [facility], [address], [place], [place] [address]    Culture   Final    NO GROWTH Performed at [facility], [place] [address] [place], [place] [address]    Report Status 07/28/2021 FINAL  Final  Aerobic Culture w Gram Stain (superficial specimen)     Status: None (Preliminary result)  Collection Time: 07/27/21  7:48 AM   Specimen: Wound  Result Value Ref Range Status   Specimen Description   Final    WOUND Performed at [facility], [address], [place], [place] [address]    Special Requests   Final    NONE Performed at [facility], [address] [place], [place] [address]    Gram Stain   Final    FEW WBC PRESENT,BOTH PMN AND MONONUCLEAR RARE GRAM POSITIVE COCCI IN PAIRS    Culture   Final    CULTURE REINCUBATED FOR BETTER GROWTH Performed at [facility], [place] [address], [place], [place] [address]    Report Status PENDING  Incomplete  Resp Panel by RT-PCR (Flu A&B, Covid) Nasopharyngeal Swab     Status: Abnormal   Collection Time: 07/27/21  7:55 AM   Specimen: Nasopharyngeal Swab; Nasopharyngeal(NP) swabs in vial transport medium  Result Value Ref Range Status   SARS Coronavirus 2 by RT PCR POSITIVE (A) NEGATIVE Final    Comment: RESULT CALLED TO, READ BACK BY AND VERIFIED WITH: Ricke Hey, RN 0900 07/27/21 GM (NOTE) SARS-CoV-2 target nucleic acids are DETECTED.  The SARS-CoV-2 RNA is generally detectable in upper respiratory specimens during the acute phase of infection. Positive results are indicative of the presence of the identified virus, but do not rule out bacterial infection or co-infection with other pathogens not detected by the test. Clinical correlation with patient history and other diagnostic information is necessary to determine patient infection  status. The expected result is Negative.  Fact Sheet for Patients: EntrepreneurPulse.com.au  Fact Sheet for Healthcare Providers: IncredibleEmployment.be  This test is not yet approved or cleared by the [place] FDA and  has been authorized for detection and/or diagnosis of SARS-CoV-2 by FDA under an Emergency Use Authorization (EUA).  This EUA will remain in effect (meaning this test can be  used) for the duration of  the COVID-19 declaration under Section 564(b)(1) of the Act, 21 U.S.C. section 360bbb-3(b)(1), unless the authorization is terminated or revoked sooner.     Influenza A by PCR NEGATIVE NEGATIVE Final   Influenza B by PCR NEGATIVE NEGATIVE Final    Comment: (NOTE) The Xpert Xpress SARS-CoV-2/FLU/RSV plus assay is intended as an aid in the diagnosis of influenza from Nasopharyngeal swab specimens and should not be used as a sole basis for treatment. Nasal washings and aspirates are unacceptable for Xpert Xpress SARS-CoV-2/FLU/RSV testing.  Fact Sheet for Patients: EntrepreneurPulse.com.au  Fact Sheet for Healthcare Providers: IncredibleEmployment.be  This test is not yet approved or cleared by the [place] FDA and has been authorized for detection and/or diagnosis of SARS-CoV-2 by FDA under an Emergency Use Authorization (EUA). This EUA will remain in effect (meaning this test can be used) for the duration of the COVID-19 declaration under Section 564(b)(1) of the Act, 21 U.S.C. section 360bbb-3(b)(1), unless the authorization is terminated or revoked.  Performed at [facility], [address] [place], [place] [address]   Blood culture (routine single)     Status: None (Preliminary result)   Collection Time: 07/27/21  7:55 AM   Specimen: BLOOD  Result Value Ref Range Status   Specimen Description BLOOD RIGHT ARM  Final   Special Requests   Final    BOTTLES DRAWN  AEROBIC AND ANAEROBIC Blood Culture results may not be optimal due to an excessive volume of blood received in culture bottles   Culture   Final    NO  GROWTH 2 DAYS Performed at [facility], [address] [place], [place] [address]    Report Status PENDING  Incomplete     Scheduled Meds:  amoxicillin-clavulanate  1 tablet Oral BID   aspirin  81 mg Oral Daily   atorvastatin  80 mg Oral Daily   carvedilol  25 mg Oral BID   Chlorhexidine Gluconate Cloth  6 each Topical Q0600   epoetin (EPOGEN/PROCRIT) injection  4,000 Units Intravenous Q M,W,F-HD   feeding supplement  237 mL Oral TID BM   gabapentin  100 mg Oral QHS   influenza vac split quadrivalent PF  0.5 mL Intramuscular Tomorrow-1000   insulin aspart  0-6 Units Subcutaneous TID WC   levETIRAcetam  750 mg Oral Q1200   lisinopril  40 mg Oral Daily   multivitamin  1 tablet Oral QHS   sevelamer carbonate  800 mg Oral TID WC   Continuous Infusions:     LOS: 2 days   Laurey Arrow, MD   If 7PM-7AM, please contact night-coverage per Amion 07/29/2021, 2:25 PM

## 2021-07-29 NOTE — Chronic Care Management (AMB) (Signed)
Chronic Care Management   CCM RN Visit Note  07/29/2021 Name: [name] MRN: [id number] DOB: 04-30-1961  Subjective: [name] is a 60 y.o. year old male who is a primary care patient of [name], [name], MD. The care management team was consulted for assistance with disease management and care coordination needs.    Engaged with patient's spouse/caregiver Carmen by telephone for follow up visit in response to provider referral for case management and care coordination services.   Consent to Services:  The patient was given information about Chronic Care Management services, agreed to services, and gave verbal consent prior to initiation of services.  Please see initial visit note for detailed documentation.    Assessment: Review of patient past medical history, allergies, medications, health status, including review of consultants reports, laboratory and other test data, was performed as part of comprehensive evaluation and provision of chronic care management services.   SDOH (Social Determinants of Health) assessments and interventions performed:  No  CCM Care Plan  Allergies  Allergen Reactions   Methotrexate Other (See Comments)    Blood count drops   Vancomycin Shortness Of Breath    Eyes watering, SOB, wheezing   Cefepime Other (See Comments)    Confusion    Tape     Facility-Administered Encounter Medications as of 07/29/2021  Medication   0.9 %  sodium chloride infusion (Manually program via Guardrails IV Fluids)   acetaminophen (TYLENOL) tablet 500 mg   albuterol (VENTOLIN HFA) 108 (90 Base) MCG/ACT inhaler 2 puff   amoxicillin-clavulanate (AUGMENTIN) 500-125 MG per tablet 500 mg   aspirin chewable tablet 81 mg   atorvastatin (LIPITOR) tablet 80 mg   carvedilol (COREG) tablet 25 mg   Chlorhexidine Gluconate Cloth 2 % PADS 6 each   dextromethorphan-guaiFENesin (MUCINEX DM) 30-600 MG per 12 hr tablet 1 tablet   dextrose 50 % solution 50 mL   epoetin  alfa (EPOGEN) injection 4,000 Units   feeding supplement (ENSURE ENLIVE / ENSURE PLUS) liquid 237 mL   gabapentin (NEURONTIN) capsule 100 mg   hydrALAZINE (APRESOLINE) injection 5 mg   hydrALAZINE (APRESOLINE) tablet 100 mg   influenza vac split quadrivalent PF (FLUARIX) injection 0.5 mL   insulin aspart (novoLOG) injection 0-6 Units   levETIRAcetam (KEPPRA) tablet 750 mg   lisinopril (ZESTRIL) tablet 40 mg   LORazepam (ATIVAN) injection 1 mg   multivitamin (RENA-VIT) tablet 1 tablet   ondansetron (ZOFRAN) injection 4 mg   sevelamer carbonate (RENVELA) tablet 800 mg   Outpatient Encounter Medications as of 07/29/2021  Medication Sig   acetaminophen (TYLENOL) 500 MG tablet Take 1,000 mg by mouth daily as needed for moderate pain or headache.    Alcohol Swabs PADS Use as directed to check blood sugar three times daily for insulin dependent type 2 diabetes.   aspirin 81 MG chewable tablet Chew 81 mg by mouth daily.   atorvastatin (LIPITOR) 80 MG tablet TAKE 1 TABLET(80 MG) BY MOUTH DAILY (Patient taking differently: Take 80 mg by mouth daily.)   Blood Glucose Monitoring Suppl (ONE TOUCH ULTRA 2) w/Device KIT Use as directed to check blood sugar three times daily. E11.9   carvedilol (COREG) 25 MG tablet Take 1 tablet (25 mg total) by mouth 2 (two) times daily.   feeding supplement (ENSURE ENLIVE / ENSURE PLUS) LIQD Take 237 mLs by mouth 3 (three) times daily between meals.   gabapentin (NEURONTIN) 400 MG capsule Take 400 mg by mouth at bedtime.   [name] PEN  40 MG/0.8ML PNKT Inject 40 mg into the skin once a week.   hydrALAZINE (APRESOLINE) 100 MG tablet Take 1 tablet (100 mg total) by mouth every 8 (eight) hours.   Insulin Aspart FlexPen (NOVOLOG) 100 UNIT/ML Inject 2-12 Units into the skin 4 (four) times daily -  before meals and at bedtime. Per sliding scale 200-249= 2 units 250-299= 4 units 300-349= 6 units 350-399= 8 units 400-449= 10 units  450-499= 12 units 500 or greater= Call MD    isosorbide mononitrate (IMDUR) 30 MG 24 hr tablet Take 1 tablet (30 mg total) by mouth daily. (Patient not taking: Reported on 07/27/2021)   levETIRAcetam (KEPPRA) 500 MG tablet Take 1 tablet (500 mg total) by mouth daily. (Patient not taking: Reported on 07/27/2021)   levETIRAcetam (KEPPRA) 750 MG tablet Take 750 mg by mouth daily at 12 noon.   lisinopril (ZESTRIL) 40 MG tablet Take 40 mg by mouth daily.   Multiple Vitamin (THEREMS PO) Take 1 tablet by mouth daily. multivitamin   sevelamer carbonate (RENVELA) 800 MG tablet TAKE 1 TABLET(800 MG) BY MOUTH THREE TIMES DAILY (Patient taking differently: Take 800 mg by mouth 3 (three) times daily with meals.)   spironolactone (ALDACTONE) 25 MG tablet Take 25 mg by mouth at bedtime.    Patient Active Problem List   Diagnosis Date Noted   Type II diabetes mellitus with renal manifestations ([place]) 07/27/2021   Anemia in ESRD (end-stage renal disease) ([place]) 07/27/2021   Hypoglycemia 07/27/2021   Hypothermia 07/27/2021   COVID-19 virus infection 07/27/2021   Aspiration pneumonia ([place]) 07/27/2021   Unstageable pressure ulcer of sacral region ([place]) 05/22/2021   Acute decompensated heart failure ([place]) 05/19/2021   Acute respiratory failure with hypoxia ([place]) 05/18/2021   Protein-calorie malnutrition, severe 05/14/2021   Acute pulmonary edema ([facility])    Shortness of breath    Fluid overload 05/12/2021   Cellulitis and abscess of buttock    History of GI bleed 02/06/2021   Long term current use of immunosuppressive drug 02/06/2021   Disorder of skin due to Crohn's disease ([place]) 24/40/1027   Complication of vascular access for dialysis 01/22/2021   Polyp of transverse colon    Acute metabolic encephalopathy 25/36/6440   Acute on chronic anemia 34/74/2595   Chronic systolic CHF (congestive heart failure) ([place]) 11/18/2020   Cerebrovascular disease 09/12/2020   Transient alteration of awareness 09/12/2020   Hyperkalemia 09/12/2020   Mild cognitive  impairment 09/30/2019   Peripheral vascular disease ([place]) 06/28/2018   Sepsis ([place]) 01/12/2018   Pressure injury of skin 12/04/2017   S/P bilateral BKA (below knee amputation) ([place]) 10/20/2017   Atherosclerosis of native arteries of extremity with rest pain ([place]) 08/05/2017   History of CVA (cerebrovascular accident) 04/15/2017   Seizure disorder ([place]) 04/15/2017   End stage renal failure on dialysis ([place]) 04/12/2016   Aphthae 02/20/2016   Hidradenitis suppurativa 02/20/2016   Leg pain 02/20/2016   Neuropathy 02/20/2016   Narrowing of intervertebral disc space 08/29/2015   Vascular disorder of lower extremity 08/29/2015   Failure of erection 08/29/2015   Hypercholesteremia 08/29/2015   Hypertension 08/29/2015   Anemia due to chronic kidney disease 05/26/2015   Venous insufficiency of leg 09/04/2014   History of deep vein thrombosis (DVT) of lower extremity 08/16/2014   Prostatic intraepithelial neoplasia 11/02/2013   Elevated prostate specific antigen (PSA) 09/11/2013   Benign prostatic hyperplasia with urinary obstruction 08/13/2013   Spermatocele 08/13/2013   Avitaminosis D 01/25/2013   Type 2 diabetes mellitus  with hypoglycemia without coma ([place]) 03/28/2012   Crohn disease ([place]) 08/03/2011    Conditions to be addressed/monitored:DMII and ESRD Patient Care Plan: Diabetes Type 2 (Adult)     Problem Identified: Disease Progression (Diabetes, Type 2)      Long-Range Goal: Disease Progression Prevented or Minimized   Start Date: 07/29/2021  Expected End Date: 10/27/2021  Priority: High  Note:   Objective:  Lab Results  Component Value Date   HGBA1C 6.5 (H) 07/29/2021      Current Barriers:  Chronic Disease Management support and educational needs r/t Diabetes self-management.  Case Manager Clinical Goal(s):  Over the next 120 days, patient will demonstrate improved adherence to prescribed treatment plan for Diabetes self management as evidenced by daily monitoring  and recording of CBG, adherence to prescribed diet and adherence to prescribed medication regimen.   Interventions:  Collaboration with [name], MD regarding development and update of comprehensive plan of care as evidenced by provider attestation and co-signature Inter-disciplinary care team collaboration (see longitudinal plan of care) Reviewed plan for diabetes management with patient's spouse/caregiver Carmen. Mr. Blancett is currently hospitalized with anticipated return to [facility] skilled nursing/rehabilitation. Reports wide fluctuations with his blood glucose levels while in rehab with readings ranging form th 30's to 600's. Reports he is being monitored closely. She is unsure of plan regarding medications/adjustments. Agreed to update the care management team if he returns home and medication management or prescription assistance is required.    Patient Goals/Self-Care Activities Administer medications as prescribed  Attend all scheduled provider appointments Monitor blood glucose levels consistently and utilize recommended interventions Adhere to prescribed diet restrictions Update the care management team regarding long term plan Notify provider or care management team with questions and new concerns as needed.     Patient Care Plan: ESRD     Problem Identified: Disease Progression      Long-Range Goal: Disease Progression Prevented or Minimized   Start Date: 06/12/2021  Expected End Date: 10/10/2021  Priority: High  Note:    Current Barriers:  Chronic Disease Management support and educational needs r/t End Stage Renal Disease in patient with Diabetes.   Goals: Over the next 120 days, patient will not require hospitalization or emergent care d/t complications r/t End Stage Renal Disease.  Interventions:  Collaboration with [name], MD regarding development and update of comprehensive plan of care as evidenced by provider attestation and  co-signature Inter-disciplinary care team collaboration (see longitudinal plan of care) Reviewed current treatment plan. Currently attends hemodialysis on Monday, Wednesday and Friday. His condition is complicated by anemia. Reports requiring a blood transfusion two weeks ago. He is also scheduled for imaging and follow up with the vascular team due to complications/bleeding from his fistula.  Reviewed medications. Advised to administer as prescribed and adjust/hold medications when instructed by his Nephrologist. Encouraged to notify the care management team with concerns r/t medication management or prescription costs. Discussed nutritional intake and compliance with the recommended diet. Reports decreased appetite due to fatigue after dialysis treatments. He reports eating breakfast without difficulty Notices a decreased appetite for the remainder of the day. We discussed options for Nepro as a nutritional supplement, but he declined. He does reports eating a high protein breakfast and a small protein snack later in the day. His spouse in interested in options for an appetite stimulate if his appetite does not improve. Update on 07/29/21. Spoke with patient's [employer]. Reports patient was readmitted to [facility] for suspected sepsis. She  voiced concerns regarding his rapid decline and possible need for long term assistance. She anticipates submitting his Medicaid application within the next week. Current plan is to transition him back to [facility] after hospital discharge. She is unsure of anticipated discharge from SNF/rehabilitation. She is agreeable to discussing concerns and options for long term placement with the CCM LCSW. Referral placed for outreach.     Self-Care Activities/Patient Goals: Administer medications as prescribed. Complete recommended labs and attend provider appointments as scheduled. Adhere to recommended high protein diet and fluid restrictions. Follow recommended  safety precautions. Keep the care management team informed functional status and possible long term needs            PLAN: Referral placed for CCM LCSW. A member of the care management team will follow up within the next month.   Cristy Friedlander [facility] Care Management [facility] [phone]

## 2021-07-29 NOTE — Progress Notes (Signed)
Have waited for epogen from pharmacy for pt's entire treatment, primary rn has again called pharmacy to notify to send stat, treatment is almost complete.

## 2021-07-29 NOTE — Progress Notes (Signed)
Ready to start dialysis, pt wants to eat breakfast before dialysis, pt eating breakfast currently and talking on phone. Delay In starting dialysis per pt request to finish breakfast first.

## 2021-07-29 NOTE — Patient Instructions (Addendum)
Thank you for allowing the Chronic Care Management team to participate in your care.   Patient Care Plan: Diabetes Type 2 (Adult)     Problem Identified: Disease Progression (Diabetes, Type 2)      Long-Range Goal: Disease Progression Prevented or Minimized   Start Date: 07/29/2021  Expected End Date: 10/27/2021  Priority: High  Note:   Objective:  Lab Results  Component Value Date   HGBA1C 6.5 (H) 07/29/2021      Current Barriers:  Chronic Disease Management support and educational needs r/t Diabetes self-management.  Case Manager Clinical Goal(s):  Over the next 120 days, patient will demonstrate improved adherence to prescribed treatment plan for Diabetes self management as evidenced by daily monitoring and recording of CBG, adherence to prescribed diet and adherence to prescribed medication regimen.   Interventions:  Collaboration with [name], MD regarding development and update of comprehensive plan of care as evidenced by provider attestation and co-signature Inter-disciplinary care team collaboration (see longitudinal plan of care) Reviewed plan for diabetes management with patient's spouse/caregiver Carmen. Mr. Word is currently hospitalized with anticipated return to [facility] skilled nursing/rehabilitation. Reports wide fluctuations with his blood glucose levels while in rehab with readings ranging form th 30's to 600's. Reports he is being monitored closely. She is unsure of plan regarding medications/adjustments. Agreed to update the care management team if he returns home and medication management or prescription assistance is required.    Patient Goals/Self-Care Activities Administer medications as prescribed  Attend all scheduled provider appointments Monitor blood glucose levels consistently and utilize recommended interventions Adhere to prescribed diet restrictions Update the care management team regarding long term plan Notify provider or care  management team with questions and new concerns as needed.     Patient Care Plan: ESRD     Problem Identified: Disease Progression      Long-Range Goal: Disease Progression Prevented or Minimized   Start Date: 06/12/2021  Expected End Date: 10/10/2021  Priority: High  Note:    Current Barriers:  Chronic Disease Management support and educational needs r/t End Stage Renal Disease in patient with Diabetes.   Goals: Over the next 120 days, patient will not require hospitalization or emergent care d/t complications r/t End Stage Renal Disease.  Interventions:  Collaboration with [name], MD regarding development and update of comprehensive plan of care as evidenced by provider attestation and co-signature Inter-disciplinary care team collaboration (see longitudinal plan of care) Reviewed current treatment plan. Currently attends hemodialysis on Monday, Wednesday and Friday. His condition is complicated by anemia. Reports requiring a blood transfusion two weeks ago. He is also scheduled for imaging and follow up with the vascular team due to complications/bleeding from his fistula.  Reviewed medications. Advised to administer as prescribed and adjust/hold medications when instructed by his Nephrologist. Encouraged to notify the care management team with concerns r/t medication management or prescription costs. Discussed nutritional intake and compliance with the recommended diet. Reports decreased appetite due to fatigue after dialysis treatments. He reports eating breakfast without difficulty Notices a decreased appetite for the remainder of the day. We discussed options for Nepro as a nutritional supplement, but he declined. He does reports eating a high protein breakfast and a small protein snack later in the day. His spouse in interested in options for an appetite stimulate if his appetite does not improve. Update on 07/29/21. Spoke with patient's [employer]. Reports  patient was readmitted to [facility] for suspected sepsis. She voiced concerns regarding his rapid  decline and possible need for long term assistance. She anticipates submitting his Medicaid application within the next week. Current plan is to transition him back to [facility] after hospital discharge. She is unsure of anticipated discharge from SNF/rehabilitation. She is agreeable to discussing concerns and options for long term placement with the CCM LCSW. Referral placed for outreach.     Self-Care Activities/Patient Goals: Administer medications as prescribed. Complete recommended labs and attend provider appointments as scheduled. Adhere to recommended high protein diet and fluid restrictions. Follow recommended safety precautions. Keep the care management team informed functional status and possible long term needs            Mrs. Cichy spouse/caregiver [name] verbalized understanding of the information discussed during the telephonic outreach. Declined need for mailed/printed instructions.  A referral was  placed for outreach with the CCM LCSW. A member of the care management team will follow up within the next month.   Cristy Friedlander [facility] Care Management [facility] [phone]

## 2021-07-29 NOTE — Progress Notes (Signed)
[facility], [place] 07/29/21  Subjective:   Hospital day # 2  Patient seen and evaluated during dialysis   HEMODIALYSIS FLOWSHEET:  Blood Flow Rate (mL/min): 400 mL/min Arterial Pressure (mmHg): -160 mmHg Venous Pressure (mmHg): 160 mmHg Transmembrane Pressure (mmHg): 60 mmHg Ultrafiltration Rate (mL/min): 400 mL/min Dialysate Flow Rate (mL/min): 500 ml/min Conductivity: Machine : 13.6 Conductivity: Machine : 13.6 Dialysis Fluid Bolus: Normal Saline Bolus Amount (mL): 250 mL  No complaints at this time  Objective:  Vital signs in last 24 hours:  Temp:  [98.2 F (36.8 C)-98.6 F (37 C)] 98.4 F (36.9 C) (10/12 1028) Pulse Rate:  [65-74] 65 (10/12 1215) Resp:  [14-20] 14 (10/12 1215) BP: (143-187)/(68-85) 158/78 (10/12 1215) SpO2:  [94 %-98 %] 97 % (10/12 0612) Weight:  [64.3 kg-64.4 kg] 64.3 kg (10/12 0944)  Weight change: 0.3 kg Filed Weights   07/27/21 1958 07/29/21 0500 07/29/21 0944  Weight: 61.5 kg 64.4 kg 64.3 kg    Intake/Output:    Intake/Output Summary (Last 24 hours) at 07/29/2021 1223 Last data filed at 07/28/2021 2057 Gross per 24 hour  Intake 237 ml  Output --  Net 237 ml    Physical Exam: General:  No acute distress, laying in the bed  HEENT  anicteric, moist oral mucous membrane  Pulm/lungs  normal breathing effort, room air, clear  CVS/Heart  no rub or gallop  Abdomen:   Soft, nontender  Extremities:  No peripheral edema, b/l BKA  Neurologic:  Alert, oriented, able to follow commands  Skin:  No acute rashes, sacral wound  Left arm AVF  Basic Metabolic Panel:  Recent Labs  Lab 07/27/21 0723 07/28/21 0417  NA 135 135  K 4.4 4.3  CL 98 96*  CO2 29 28  GLUCOSE 142* 252*  BUN 58* 43*  CREATININE 5.24* 3.37*  CALCIUM 9.1 8.7*  MG 1.9  --       CBC: Recent Labs  Lab 07/27/21 0723 07/28/21 0417 07/29/21 0502  WBC 16.6* 9.7 8.8  NEUTROABS 15.4*  --   --   HGB 6.0* 6.9* 6.2*  HCT 20.0* 21.7*  20.3*  MCV 98.0 95.2 96.7  PLT 224 193 171       Lab Results  Component Value Date   HEPBSAG NON REACTIVE 07/27/2021   HEPBSAB Reactive (A) 07/27/2021      Microbiology:  Recent Results (from the past 240 hour(s))  Urine Culture     Status: None   Collection Time: 07/27/21  7:24 AM   Specimen: In/Out Cath Urine  Result Value Ref Range Status   Specimen Description   Final    IN/OUT CATH URINE Performed at [facility], [address], [place], [place] [address]    Special Requests   Final    NONE Performed at [facility], [address] [place], [place] [address]    Culture   Final    NO GROWTH Performed at [facility], [place] [address], [place], [place] [address]    Report Status 07/28/2021 FINAL  Final  Aerobic Culture w Gram Stain (superficial specimen)     Status: None (Preliminary result)   Collection Time: 07/27/21  7:48 AM   Specimen: Wound  Result Value Ref Range Status   Specimen Description   Final    WOUND Performed at [facility], [address] [place], [place] [address]    Special Requests   Final    NONE Performed at [facility], [place], [place] [address]    Gram Stain   Final    FEW WBC PRESENT,BOTH PMN AND MONONUCLEAR RARE GRAM POSITIVE COCCI IN PAIRS    Culture   Final    CULTURE REINCUBATED FOR BETTER GROWTH Performed at [facility], [place] [address] [place], [place] [address]    Report Status PENDING  Incomplete  Resp Panel by RT-PCR (Flu A&B, Covid) Nasopharyngeal Swab     Status: Abnormal   Collection Time: 07/27/21  7:55 AM   Specimen: Nasopharyngeal Swab; Nasopharyngeal(NP) swabs in vial transport medium  Result Value Ref Range Status   SARS Coronavirus 2 by RT PCR POSITIVE (A) NEGATIVE Final    Comment: RESULT CALLED TO, READ BACK BY AND VERIFIED WITH: Ricke Hey, RN 0900 07/27/21 GM (NOTE) SARS-CoV-2 target nucleic acids are DETECTED.  The SARS-CoV-2  RNA is generally detectable in upper respiratory specimens during the acute phase of infection. Positive results are indicative of the presence of the identified virus, but do not rule out bacterial infection or co-infection with other pathogens not detected by the test. Clinical correlation with patient history and other diagnostic information is necessary to determine patient infection status. The expected result is Negative.  Fact Sheet for Patients: EntrepreneurPulse.com.au  Fact Sheet for Healthcare Providers: IncredibleEmployment.be  This test is not yet approved or cleared by the [place] FDA and  has been authorized for detection and/or diagnosis of SARS-CoV-2 by FDA under an Emergency Use Authorization (EUA).  This EUA will remain in effect (meaning this test can be  used) for the duration of  the COVID-19 declaration under Section 564(b)(1) of the Act, 21 U.S.C. section 360bbb-3(b)(1), unless the authorization is terminated or revoked sooner.     Influenza A by PCR NEGATIVE NEGATIVE Final   Influenza B by PCR NEGATIVE NEGATIVE Final    Comment: (NOTE) The Xpert Xpress SARS-CoV-2/FLU/RSV plus assay is intended as an aid in the diagnosis of influenza from Nasopharyngeal swab specimens and should not be used as a sole basis for treatment. Nasal washings and aspirates are unacceptable for Xpert Xpress SARS-CoV-2/FLU/RSV testing.  Fact Sheet for Patients: EntrepreneurPulse.com.au  Fact Sheet for Healthcare Providers: IncredibleEmployment.be  This test is not yet approved or cleared by the [place] FDA and has been authorized for detection and/or diagnosis of SARS-CoV-2 by FDA under an Emergency Use Authorization (EUA). This EUA will remain in effect (meaning this test can be used) for the duration of the COVID-19 declaration under Section 564(b)(1) of the Act, 21 U.S.C. section  360bbb-3(b)(1), unless the authorization is terminated or revoked.  Performed at [facility], [address] [place], [place] [address]   Blood culture (routine single)     Status: None (Preliminary result)   Collection Time: 07/27/21  7:55 AM   Specimen: BLOOD  Result Value Ref Range Status   Specimen Description BLOOD RIGHT ARM  Final   Special Requests   Final    BOTTLES DRAWN AEROBIC AND ANAEROBIC Blood Culture results may not be optimal due to an excessive volume of blood received in culture bottles   Culture   Final    NO GROWTH 2 DAYS Performed at [facility], [address] [place], [place] [address]    Report Status PENDING  Incomplete    Coagulation Studies: Recent Labs    07/27/21 0723  LABPROT 14.8  INR 1.2     Urinalysis: Recent Labs    07/27/21 0724  COLORURINE YELLOW*  LABSPEC 1.011  PHURINE 7.0  GLUCOSEU 150*  HGBUR NEGATIVE  BILIRUBINUR NEGATIVE  KETONESUR NEGATIVE  PROTEINUR >=300*  NITRITE NEGATIVE  LEUKOCYTESUR NEGATIVE       Imaging: No results found.   Medications:      amoxicillin-clavulanate  1 tablet Oral BID   aspirin  81 mg Oral Daily   atorvastatin  80 mg Oral Daily   carvedilol  25 mg Oral BID   Chlorhexidine Gluconate Cloth  6 each Topical Q0600   epoetin (EPOGEN/PROCRIT) injection  4,000 Units Intravenous Q M,W,F-HD   feeding supplement  237 mL Oral TID BM   gabapentin  100 mg Oral QHS   influenza vac split quadrivalent PF  0.5 mL Intramuscular Tomorrow-1000   insulin aspart  0-6 Units Subcutaneous TID WC   levETIRAcetam  750 mg Oral Q1200   lisinopril  40 mg Oral Daily   multivitamin  1 tablet Oral QHS   sevelamer carbonate  800 mg Oral TID WC   acetaminophen, albuterol, dextromethorphan-guaiFENesin, dextrose, hydrALAZINE, LORazepam, ondansetron (ZOFRAN) IV  Assessment/ Plan:  60 y.o. male with  with end stage renal disease on hemodialysis, diabetes mellitus type II, hypertension, CVA,  peripheral vascular disease status post bilateral BKA    admitted on 07/27/2021 for Hypoglycemia [E16.2] Sepsis ([place]) [A41.9] Acute on chronic anemia [D64.9] Sepsis with encephalopathy without septic shock, due to unspecified organism ([place]) [A41.9, R65.20, G93.40]  # ESRD # Secondary hyperparathyroidism of renal origin N 25.81  # Anemia of CKD # Covid -19 positive # Aspiration Pneumonia  BP and volume are acceptable; Receiving dialysis today, tolerated well at this time Receiving EPO with dialysis treatments. Hgb declined to 6.2. May consider an additional transfusion. Will defer to primary team.  Continue sevelamer with meals Transition to oral Augmentin    LOS: 2 Colon [place] 10/12/202212:23 [place], [place]

## 2021-07-29 NOTE — Progress Notes (Signed)
Waiting on epogen from pharamcy.

## 2021-07-29 NOTE — TOC Progression Note (Signed)
Transition of Care [facility]) - Progression Note    Patient Details  Name: [name] MRN: [id number] Date of Birth: Mar 03, 1961  Transition of Care [facility]) CM/SW [place], RN Phone Number: 07/29/2021, 3:00 PM  Clinical Narrative:   Anticipated discharge tomorrow to [facility], facility and patient aware.    Expected Discharge Plan: [place] (Patient is a resident of [facility]) Barriers to Discharge: Continued Medical Work up  Expected Discharge Plan and Services Expected Discharge Plan: [place] (Patient is a resident of [facility]) In-house Referral: NA Discharge Planning Services: CM Consult Post Acute Care Choice: Long Term Acute Care (LTAC) Living arrangements for the past 2 months: Skilled Nursing Facility                                       Social Determinants of Health (SDOH) Interventions    Readmission Risk Interventions Readmission Risk Prevention Plan 07/28/2021 02/10/2021 02/08/2021  Transportation Screening Complete Complete Complete  PCP or Specialist Appt within 5-7 Days - - Complete  Home Care Screening - - Complete  Medication Review (RN CM) - - Complete  HRI or Home Care Consult - Complete -  Palliative Care Screening - Complete -  Medication Review ([place]) Complete Complete -  PCP or Specialist appointment within 3-5 days of discharge Complete - -  [place] or [place] (No Data) - -  Palliative Care Screening Not Applicable - -  [place] Not Applicable - -  Some recent data might be hidden

## 2021-07-30 ENCOUNTER — Telehealth: Payer: Self-pay | Admitting: *Deleted

## 2021-07-30 ENCOUNTER — Ambulatory Visit: Payer: Medicare Other | Admitting: Physician Assistant

## 2021-07-30 LAB — BPAM RBC
Blood Product Expiration Date: 202210282359
Blood Product Expiration Date: 202211062359
ISSUE DATE / TIME: 202210101022
ISSUE DATE / TIME: 202210121609
Unit Type and Rh: 7300
Unit Type and Rh: 7300

## 2021-07-30 LAB — AEROBIC CULTURE W GRAM STAIN (SUPERFICIAL SPECIMEN)

## 2021-07-30 LAB — CBC
HCT: 23.5 % — ABNORMAL LOW (ref 39.0–52.0)
Hemoglobin: 7.1 g/dL — ABNORMAL LOW (ref 13.0–17.0)
MCH: 28.3 pg (ref 26.0–34.0)
MCHC: 30.2 g/dL (ref 30.0–36.0)
MCV: 93.6 fL (ref 80.0–100.0)
Platelets: 170 10*3/uL (ref 150–400)
RBC: 2.51 MIL/uL — ABNORMAL LOW (ref 4.22–5.81)
RDW: 19.1 % — ABNORMAL HIGH (ref 11.5–15.5)
WBC: 9.4 10*3/uL (ref 4.0–10.5)
nRBC: 0 % (ref 0.0–0.2)

## 2021-07-30 LAB — BASIC METABOLIC PANEL
Anion gap: 7 (ref 5–15)
BUN: 40 mg/dL — ABNORMAL HIGH (ref 6–20)
CO2: 30 mmol/L (ref 22–32)
Calcium: 8.2 mg/dL — ABNORMAL LOW (ref 8.9–10.3)
Chloride: 96 mmol/L — ABNORMAL LOW (ref 98–111)
Creatinine, Ser: 3.33 mg/dL — ABNORMAL HIGH (ref 0.61–1.24)
GFR, Estimated: 20 mL/min — ABNORMAL LOW (ref >60)
Glucose, Bld: 235 mg/dL — ABNORMAL HIGH (ref 70–99)
Potassium: 4 mmol/L (ref 3.5–5.1)
Sodium: 133 mmol/L — ABNORMAL LOW (ref 135–145)

## 2021-07-30 LAB — TYPE AND SCREEN
ABO/RH(D): B POS
Antibody Screen: NEGATIVE
Unit division: 0
Unit division: 0

## 2021-07-30 LAB — GLUCOSE, CAPILLARY
Glucose-Capillary: 193 mg/dL — ABNORMAL HIGH (ref 70–99)
Glucose-Capillary: 213 mg/dL — ABNORMAL HIGH (ref 70–99)
Glucose-Capillary: 197 mg/dL — ABNORMAL HIGH (ref 70–99)
Glucose-Capillary: 246 mg/dL — ABNORMAL HIGH (ref 70–99)

## 2021-07-30 MED ORDER — LEVETIRACETAM 500 MG PO TABS
500.0000 mg | ORAL_TABLET | Freq: Every day | ORAL | Status: DC
  Administered 2021-07-30 – 2021-08-02 (×4): 500 mg via ORAL
  Filled 2021-07-30 (×4): qty 1

## 2021-07-30 NOTE — Progress Notes (Signed)
Hemodialysis patient known at [facility] ([place] MWF, however due to to new COVID result patient will treat at [facility] MWF 12:00pm. Usually patient will  treat on Cohort shift for 10 days when not admitted for [place]. If admitted for COVID patient will treat for 20 days. Please inform Di Kindle to contact clinic for exact duration of isolation treatments. Please contact me with any dialysis placement concerns.  Elvera Bicker Dialysis Coordinator [phone]

## 2021-07-30 NOTE — Consult Note (Signed)
GI Inpatient Consult Note  Reason for Consult: Melena   Attending Requesting Consult: Dr. Laurey Arrow  History of Present Illness: [name] is a 60 y.o. male seen for evaluation of melanotic stool at the request of hospitalist - Dr. Laurey Arrow. Pt has a PMH of HTN, HLD, T2DM, Hx of CVA with residual left-sided weakness, PAD, bilateral BKA with prosthesis, Hx of DVT, ACOD, seizure disorder, chronic systolic CHF (EF 27-25%), BPH, ESRD on HD MWF, hidradenitis suppurativa, and COVID-19 infection admitted to [facility] earlier this week on 10/10 with hypoglycemia and possible aspiration pneumonia. He was found to have positive COVID-19 test, but reports he initially tested positive with home test on 9/19. He is also being treated for a sacral ulcer. GI was consulted for a large melanotic/burgundy stool yesterday around dinner time. Patient reports still stool was a dark red color. There have no further episodes of gross hematochezia or melena. He did receive one unit of pRBCs yesterday evening since his hemoglobin dropped from 7.1 --> 6.2. He has history of anemia of chronic disease with baseline 7.6-7.8. He is a patient in the outpatient setting with [name] of [facility] and last saw her April 2022. He underwent bidirectional endoscopy with [name] earlier this year with no etiologies of anemia identified. He did have several subcentimeter tubular adenomas removed with negative colon biopsies. He was told his anemia was most c/w anemia of chronic disease and there was no further GI work-up advised. He has been receiving EPO with dialysis. He has baseline chronic fatigue and reports he feels at baseline. He denies any issues with abdominal pain, abdominal cramping, nausea, vomiting, indigestion, chest pain, shortness of breath, or syncopal episodes. He does have ongoing memory issues and reports wife is usually the person to talk to. She is not allowed to be here since he was COVID-19 positive on admission.     Past Medical History:  Past Medical History:  Diagnosis Date   Acute metabolic encephalopathy 12/22/6438   Anemia    Crohn disease ([place])    Diabetes mellitus without complication ([place])    DVT of lower extremity (deep venous thrombosis) ([place]) 2016   Empyema ([place]) 05/20/2017   Encephalopathy 12/04/2017   Fall at home, initial encounter 09/12/2020   Hidradenitis suppurativa    Hypertension    ICH (intracerebral hemorrhage) ([place])    Peritonitis ([place]) 04/21/2017   Pyogenic arthritis of knee ([place]) 02/04/2016   Renal disorder    Sepsis ([place]) 01/12/2018   Stroke [facility])     Problem List: Patient Active Problem List   Diagnosis Date Noted   Type II diabetes mellitus with renal manifestations ([place]) 07/27/2021   Anemia in ESRD (end-stage renal disease) ([place]) 07/27/2021   Hypoglycemia 07/27/2021   Hypothermia 07/27/2021   COVID-19 virus infection 07/27/2021   Aspiration pneumonia ([place]) 07/27/2021   Unstageable pressure ulcer of sacral region ([place]) 05/22/2021   Acute decompensated heart failure ([place]) 05/19/2021   Acute respiratory failure with hypoxia ([place]) 05/18/2021   Protein-calorie malnutrition, severe 05/14/2021   Acute pulmonary edema (H[facility])    Shortness of breath    Fluid overload 05/12/2021   Cellulitis and abscess of buttock    History of GI bleed 02/06/2021   Long term current use of immunosuppressive drug 02/06/2021   Disorder of skin due to Crohn's disease ([place]) 34/74/2595   Complication of vascular access for dialysis 01/22/2021   Polyp of transverse colon    Acute metabolic encephalopathy 63/87/5643   Acute  on chronic anemia 70/26/3785   Chronic systolic CHF (congestive heart failure) ([place]) 11/18/2020   Cerebrovascular disease 09/12/2020   Transient alteration of awareness 09/12/2020   Hyperkalemia 09/12/2020   Mild cognitive impairment 09/30/2019   Peripheral vascular disease ([place]) 06/28/2018   Sepsis ([place]) 01/12/2018   Pressure injury of skin 12/04/2017   S/P  bilateral BKA (below knee amputation) ([place]) 10/20/2017   Atherosclerosis of native arteries of extremity with rest pain ([place]) 08/05/2017   History of CVA (cerebrovascular accident) 04/15/2017   Seizure disorder ([place]) 04/15/2017   End stage renal failure on dialysis ([place]) 04/12/2016   Aphthae 02/20/2016   Hidradenitis suppurativa 02/20/2016   Leg pain 02/20/2016   Neuropathy 02/20/2016   Narrowing of intervertebral disc space 08/29/2015   Vascular disorder of lower extremity 08/29/2015   Failure of erection 08/29/2015   Hypercholesteremia 08/29/2015   Hypertension 08/29/2015   Anemia due to chronic kidney disease 05/26/2015   Venous insufficiency of leg 09/04/2014   History of deep vein thrombosis (DVT) of lower extremity 08/16/2014   Prostatic intraepithelial neoplasia 11/02/2013   Elevated prostate specific antigen (PSA) 09/11/2013   Benign prostatic hyperplasia with urinary obstruction 08/13/2013   Spermatocele 08/13/2013   Avitaminosis D 01/25/2013   Type 2 diabetes mellitus with hypoglycemia without coma ([place]) 03/28/2012   Crohn disease ([place]) 08/03/2011    Past Surgical History: Past Surgical History:  Procedure Laterality Date   A/V FISTULAGRAM Left 02/24/2021   Procedure: A/V FISTULAGRAM;  Surgeon: Katha Cabal, MD;  Location: [facility];  Service: Cardiovascular;  Laterality: Left;   ABDOMINAL SURGERY     AMPUTATION FINGER Left 06/2019   PR AMPUTATION LONG FINGER/THUMB+FLAPS [facility]   ANGIOPLASTY Left    left [facility]pop [facility] 04-11-2018   BELOW KNEE LEG AMPUTATION Right 08/2017   [facility]   COLONOSCOPY     COLONOSCOPY WITH PROPOFOL N/A 10/28/2020   Procedure: COLONOSCOPY WITH PROPOFOL;  Surgeon: Lin Landsman, MD;  Location: [place];  Service: Gastroenterology;  Laterality: N/A;   COLONOSCOPY WITH PROPOFOL N/A 11/21/2020   Procedure: COLONOSCOPY WITH PROPOFOL;  Surgeon: [name], MD;  Location[facility];  Service: Endoscopy;  Laterality: N/A;    DIALYSIS/PERMA CATHETER INSERTION N/A 12/09/2017   Procedure: DIALYSIS/PERMA CATHETER INSERTION;  Surgeon: Katha Cabal, MD;  Location: [place] [facility];  Service: Cardiovascular;  Laterality: N/A;   DIALYSIS/PERMA CATHETER INSERTION N/A 12/12/2017   Procedure: DIALYSIS/PERMA CATHETER INSERTION;  Surgeon: Algernon Huxley, MD;  Location: [place] [facility];  Service: Cardiovascular;  Laterality: N/A;   DIALYSIS/PERMA CATHETER REMOVAL Left 12/09/2017   Procedure: DIALYSIS/PERMA CATHETER REMOVAL;  Surgeon: Katha Cabal, MD;  Location: [facility] [facility];  Service: Cardiovascular;  Laterality: Left;   ESOPHAGOGASTRODUODENOSCOPY (EGD) WITH PROPOFOL N/A 11/20/2020   Procedure: ESOPHAGOGASTRODUODENOSCOPY (EGD) WITH PROPOFOL;  Surgeon: [name], MD;  Location: [facility] ENDOSCOPY;  Service: Endoscopy;  Laterality: N/A;   KNEE SURGERY Left 02/04/2016   [facility]   LEG AMPUTATION THROUGH LOWER TIBIA AND FIBULA Left 06/22/2018   [facility]   LOWER EXTREMITY ANGIOGRAPHY Right 08/08/2017   Procedure: Lower Extremity Angiography;  Surgeon: Algernon Huxley, MD;  Location: [place] [facility];  Service: Cardiovascular;  Laterality: Right;   LOWER EXTREMITY ANGIOGRAPHY Right 08/22/2017   Procedure: Lower Extremity Angiography;  Surgeon: Algernon Huxley, MD;  Location: [place];  Service: Cardiovascular;  Laterality: Right;   LOWER EXTREMITY INTERVENTION  08/08/2017   Procedure: LOWER EXTREMITY INTERVENTION;  Surgeon: Lucky Cowboy,  Erskine Squibb, MD;  Location: [place] [facility];  Service: Cardiovascular;;   LOWER EXTREMITY INTERVENTION  08/22/2017   Procedure: LOWER EXTREMITY INTERVENTION;  Surgeon: Algernon Huxley, MD;  Location: [facility];  Service: Cardiovascular;;    Allergies: Allergies  Allergen Reactions   Methotrexate Other (See Comments)    Blood count drops   Vancomycin Shortness Of Breath    Eyes watering, SOB, wheezing   Cefepime Other (See Comments)    Confusion    Tape     Home  Medications: Medications Prior to Admission  Medication Sig Dispense Refill Last Dose   acetaminophen (TYLENOL) 500 MG tablet Take 1,000 mg by mouth daily as needed for moderate pain or headache.    07/26/2021 at 1723   aspirin 81 MG chewable tablet Chew 81 mg by mouth daily.   07/26/2021   atorvastatin (LIPITOR) 80 MG tablet TAKE 1 TABLET(80 MG) BY MOUTH DAILY (Patient taking differently: Take 80 mg by mouth daily.) 90 tablet 3 07/26/2021   carvedilol (COREG) 25 MG tablet Take 1 tablet (25 mg total) by mouth 2 (two) times daily. 60 tablet 12 07/26/2021 at 2147   gabapentin (NEURONTIN) 400 MG capsule Take 400 mg by mouth at bedtime.   07/26/2021   [name] PEN 40 MG/0.8ML PNKT Inject 40 mg into the skin once a week.   07/23/2021   hydrALAZINE (APRESOLINE) 100 MG tablet Take 1 tablet (100 mg total) by mouth every 8 (eight) hours. 90 tablet 1 07/26/2021 at 2304   Insulin Aspart FlexPen (NOVOLOG) 100 UNIT/ML Inject 2-12 Units into the skin 4 (four) times daily -  before meals and at bedtime. Per sliding scale 200-249= 2 units 250-299= 4 units 300-349= 6 units 350-399= 8 units 400-449= 10 units  450-499= 12 units 500 or greater= Call MD   07/26/2021   levETIRAcetam (KEPPRA) 750 MG tablet Take 750 mg by mouth daily at 12 noon.   07/26/2021   lisinopril (ZESTRIL) 40 MG tablet Take 40 mg by mouth daily.   07/26/2021   Multiple Vitamin (THEREMS PO) Take 1 tablet by mouth daily. multivitamin   07/26/2021   sevelamer carbonate (RENVELA) 800 MG tablet TAKE 1 TABLET(800 MG) BY MOUTH THREE TIMES DAILY (Patient taking differently: Take 800 mg by mouth 3 (three) times daily with meals.) 270 tablet 0 07/26/2021   spironolactone (ALDACTONE) 25 MG tablet Take 25 mg by mouth at bedtime.   07/26/2021   Alcohol Swabs PADS Use as directed to check blood sugar three times daily for insulin dependent type 2 diabetes. 100 each 12    Blood Glucose Monitoring Suppl (ONE TOUCH ULTRA 2) w/Device KIT Use as directed to check blood sugar  three times daily. E11.9 1 each 0    feeding supplement (ENSURE ENLIVE / ENSURE PLUS) LIQD Take 237 mLs by mouth 3 (three) times daily between meals. 237 mL 12    isosorbide mononitrate (IMDUR) 30 MG 24 hr tablet Take 1 tablet (30 mg total) by mouth daily. (Patient not taking: Reported on 07/27/2021) 30 tablet 2 Not Taking   levETIRAcetam (KEPPRA) 500 MG tablet Take 1 tablet (500 mg total) by mouth daily. (Patient not taking: Reported on 07/27/2021) 90 tablet 0 Not Taking   Home medication reconciliation was completed with the patient.   Scheduled Inpatient Medications:    amoxicillin-clavulanate  1 tablet Oral BID   atorvastatin  80 mg Oral Daily   carvedilol  25 mg Oral BID   Chlorhexidine Gluconate Cloth  6  each Topical Q0600   epoetin (EPOGEN/PROCRIT) injection  4,000 Units Intravenous Q M,W,F-HD   feeding supplement  237 mL Oral TID BM   gabapentin  100 mg Oral QHS   hydrALAZINE  100 mg Oral Q8H   influenza vac split quadrivalent PF  0.5 mL Intramuscular Tomorrow-1000   insulin aspart  0-6 Units Subcutaneous TID WC   levETIRAcetam  500 mg Oral Daily   levETIRAcetam  750 mg Oral Q1200   lisinopril  40 mg Oral Daily   multivitamin  1 tablet Oral QHS   pantoprazole (PROTONIX) IV  40 mg Intravenous Q12H   sevelamer carbonate  800 mg Oral TID WC    Continuous Inpatient Infusions:    PRN Inpatient Medications:  acetaminophen, albuterol, dextromethorphan-guaiFENesin, dextrose, hydrALAZINE, LORazepam, ondansetron (ZOFRAN) IV  Family History: family history includes Arthritis in his brother; Diabetes in his mother, sister, sister, and sister; Heart disease in his father and mother; Irritable bowel syndrome in his sister; Psoriasis in his brother; Rheumatic fever in his father.  The patient's family history is negative for inflammatory bowel disorders, GI malignancy, or solid organ transplantation.  Social History:   reports that he has quit smoking. He has never used smokeless  tobacco. He reports current drug use. Drug: Marijuana. He reports that he does not drink alcohol. The patient denies ETOH, tobacco, or drug use.   Review of Systems: Constitutional: Weight is stable.  Eyes: No changes in vision. ENT: No oral lesions, sore throat.  GI: see HPI.  Heme/Lymph: No easy bruising.  CV: No chest pain.  GU: No hematuria.  Integumentary: No rashes.  Neuro: No headaches.  Psych: No depression/anxiety.  Endocrine: No heat/cold intolerance.  Allergic/Immunologic: No urticaria.  Resp: No cough, SOB.  Musculoskeletal: No joint swelling.    Physical Examination: BP (!) 191/84   Pulse 70   Temp 98.2 F (36.8 C)   Resp 16   Ht 5' 11"  (1.803 m) Comment: per-amputation height  Wt 64 kg   SpO2 99%   BMI 19.68 kg/m  Chronically ill-appearing male resting in hospital bed. No accessory muscle use upon breathing.  Gen: NAD, alert and oriented x 4 HEENT: PEERLA, EOMI, Neck: supple, no JVD or thyromegaly Chest: CTA bilaterally, no wheezes, crackles, or other adventitious sounds CV: RRR, no m/g/c/r Abd: soft, NT, ND, +BS in all four quadrants; no HSM, guarding, ridigity, or rebound tenderness Ext: bilateral below knee amputation,  Skin: no rash or lesions noted Lymph: no LAD  Data: Lab Results  Component Value Date   WBC 9.4 07/30/2021   HGB 7.1 (L) 07/30/2021   HCT 23.5 (L) 07/30/2021   MCV 93.6 07/30/2021   PLT 170 07/30/2021   Recent Labs  Lab 07/29/21 0502 07/29/21 2211 07/30/21 0426  HGB 6.2* 7.8* 7.1*   Lab Results  Component Value Date   NA 133 (L) 07/30/2021   K 4.0 07/30/2021   CL 96 (L) 07/30/2021   CO2 30 07/30/2021   BUN 40 (H) 07/30/2021   CREATININE 3.33 (H) 07/30/2021   Lab Results  Component Value Date   ALT 22 07/27/2021   AST 20 07/27/2021   ALKPHOS 94 07/27/2021   BILITOT 0.6 07/27/2021   Recent Labs  Lab 07/27/21 0723  APTT 36  INR 1.2    Summary of GI Procedures:   EGD 11/20/2020 - Small hiatal hernia. -  Normal stomach. - Duodenitis. - No specimens collected.   Colonoscopy 11/21/2020 - Preparation of the colon was fair. - The  examined portion of the ileum was normal. Biopsied. - One 9 mm polyp at the ileocecal valve, removed with a hot snare. Resected and retrieved. - Two 2 to 3 mm polyps in the ascending colon, removed with a cold biopsy forceps. Resected and retrieved. - Five 6 to 9 mm polyps in the transverse colon, removed with a cold snare. Resected and retrieved. - Five 4 to 7 mm polyps in the descending colon, removed with a cold snare. Resected and retrieved. - One 5 mm polyp in the sigmoid colon, removed with a cold snare. Resected and retrieved. - Erythematous mucosa in the rectum. Biopsied. DIAGNOSIS:  A. TERMINAL ILEUM; COLD BIOPSY:  - ILEAL MUCOSA WITH NO SIGNIFICANT PATHOLOGIC ALTERATION.  - NEGATIVE FOR ACTIVE INFLAMMATION AND FEATURES OF CHRONICITY.  - NEGATIVE FOR DYSPLASIA AND MALIGNANCY.   B. ILEOCECAL VALVE POLYP; HOT SNARE:  - TUBULAR ADENOMA.  - NEGATIVE FOR HIGH-GRADE DYSPLASIA AND MALIGNANCY.   C. COLON POLYP, ASCENDING; COLD BIOPSY:  - TUBULAR ADENOMA.  - MELANOSIS COLI.  - NEGATIVE FOR HIGH-GRADE DYSPLASIA AND MALIGNANCY.   D. COLON POLYP X5, TRANSVERSE; COLD SNARE:  - TUBULAR ADENOMA, MULTIPLE FRAGMENTS.  - NEGATIVE FOR HIGH-GRADE DYSPLASIA AND MALIGNANCY.   E. COLON POLYP X5, DESCENDING; COLD SNARE:  - TUBULAR ADENOMA, MULTIPLE FRAGMENTS.  - NEGATIVE FOR HIGH-GRADE DYSPLASIA AND MALIGNANCY.   F. COLON POLYP, SIGMOID; COLD SNARE:  - TUBULAR ADENOMA.  - NEGATIVE FOR HIGH-GRADE DYSPLASIA AND MALIGNANCY.   G. RECTUM; COLD BIOPSY:  - COLORECTAL MUCOSA WITH CONGESTION OF THE LAMINA PROPRIA.  - MELANOSIS COLI.  - NEGATIVE FOR ACTIVE INFLAMMATION AND FEATURES OF CHRONICITY.  - NEGATIVE FOR MICROSCOPIC COLITIS, DYSPLASIA, AND MALIGNANCY.    Assessment/Plan:  60 y/o AA male with a PMH of HTN, HLD, T2DM, Hx of CVA with residual left-sided weakness, PAD,  bilateral BKA with prosthesis, Hx of DVT, ACOD, seizure disorder, chronic systolic CHF (EF 60-45%), BPH, ESRD on HD MWF, hidradenitis suppurativa, and COVID-19 infection admitted to [facility] for chief complaint of altered mental status and found to have hypoglycemia, suspected aspiration pneumonia, and sacral ulcer. GI consulted for concerns of GIB with one reported large episode of painless hematochezia  Hematochezia/Melena with concern of GI bleed - one large BM yesterday afternoon which was dark red in color. He had drop in hemoglobin from 7--> 6.2 and is s/p 1 unit of pRBCs with hemolgobin back up to 7.1 this morning. No recurrent hematochezia. DDx includes anal outlet etiology, resolving diverticular bleed, colitis, ischemic colitis, AVMs, colon polyps, occult neoplasm, or less likely UGIB.   Acute metabolic encephalopathy - resolved, 2/2 hypoglycemia   Suspected aspiration pneumonia with sepsis - improving  ESRD on HD - MWF schedule  COVID-19 Positive  Anemia of chronic disease - followed by Nephrology/Hematology, receives EPO with dialysis. Hemoglobin baseline 7s.  Uncontrolled T2DM with ESRD  Recommendations:  - No clinical concern for acute GI bleeding at this time. H&H at baseline with no recurrent hematochezia/melena.  - Continue to monitor serial H&H. Transfuse for Hgb <7.0.  - Continue PPI for gastric protection - Hold antiplatelet and avoid NSAIDs - No plans for urgent luminal evaluation at present time given lack of overt gastrointestinal bleeding or further drop in H&H. Previous EGD and colonoscopy performed this year negative for blood loss. His anemia is most likely secondary to anemia of chronic disease in setting of ESRD.  - If he develops further drop in H&H or develops overt gastrointestinal blood loss, will need to consider repeat  scopes +/- video capsule endoscopy - [name] will see the patient this afternoon  Thank you for the consult. Please call with questions or  concerns.  Reeves Forth [facility] [phone] (Cell)

## 2021-07-30 NOTE — NC FL2 (Signed)
[place] LEVEL OF CARE SCREENING TOOL     IDENTIFICATION  Patient Name: [name] Birthdate: 1961/05/16 Sex: male Admission Date (Current Location): 07/27/2021  [facility] and [place] Number:  Engineering geologist and Address:  [facility], [address], [place], [place] [address]      Provider Number: [id number]  Attending Physician Name and Address:  Gwynne Edinger, MD  Relative Name and Phone Number:  Kier, Smead (Spouse)   [phone] [facility] Phone)Rarick,Tamia Daughter [phone]    Current Level of Care: SNF Recommended Level of Care: [place] Prior Approval Number:    Date Approved/Denied:   PASRR Number: [id number]  Discharge Plan: SNF    Current Diagnoses: Patient Active Problem List   Diagnosis Date Noted   Type II diabetes mellitus with renal manifestations ([place]) 07/27/2021   Anemia in ESRD (end-stage renal disease) ([place]) 07/27/2021   Hypoglycemia 07/27/2021   Hypothermia 07/27/2021   COVID-19 virus infection 07/27/2021   Aspiration pneumonia ([place]) 07/27/2021   Unstageable pressure ulcer of sacral region ([place]) 05/22/2021   Acute decompensated heart failure ([place]) 05/19/2021   Acute respiratory failure with hypoxia ([place]) 05/18/2021   Protein-calorie malnutrition, severe 05/14/2021   Acute pulmonary edema ([place])    Shortness of breath    Fluid overload 05/12/2021   Cellulitis and abscess of buttock    History of GI bleed 02/06/2021   Long term current use of immunosuppressive drug 02/06/2021   Disorder of skin due to Crohn's disease ([place]) 74/16/3845   Complication of vascular access for dialysis 01/22/2021   Polyp of transverse colon    Acute metabolic encephalopathy 36/46/8032   Acute on chronic anemia 10/10/8249   Chronic systolic CHF (congestive heart failure) ([place]) 11/18/2020   Cerebrovascular disease 09/12/2020   Transient alteration of awareness 09/12/2020   Hyperkalemia  09/12/2020   Mild cognitive impairment 09/30/2019   Peripheral vascular disease ([place]) 06/28/2018   Sepsis ([place]) 01/12/2018   Pressure injury of skin 12/04/2017   S/P bilateral BKA (below knee amputation) ([place]) 10/20/2017   Atherosclerosis of native arteries of extremity with rest pain ([place]) 08/05/2017   History of CVA (cerebrovascular accident) 04/15/2017   Seizure disorder ([place]) 04/15/2017   End stage renal failure on dialysis ([place]) 04/12/2016   Aphthae 02/20/2016   Hidradenitis suppurativa 02/20/2016   Leg pain 02/20/2016   Neuropathy 02/20/2016   Narrowing of intervertebral disc space 08/29/2015   Vascular disorder of lower extremity 08/29/2015   Failure of erection 08/29/2015   Hypercholesteremia 08/29/2015   Hypertension 08/29/2015   Anemia due to chronic kidney disease 05/26/2015   Venous insufficiency of leg 09/04/2014   History of deep vein thrombosis (DVT) of lower extremity 08/16/2014   Prostatic intraepithelial neoplasia 11/02/2013   Elevated prostate specific antigen (PSA) 09/11/2013   Benign prostatic hyperplasia with urinary obstruction 08/13/2013   Spermatocele 08/13/2013   Avitaminosis D 01/25/2013   Type 2 diabetes mellitus with hypoglycemia without coma ([place]) 03/28/2012   Crohn disease ([place]) 08/03/2011    Orientation RESPIRATION BLADDER Height & Weight     Self, Place, Time (Memory impairment)  Normal (on Room Air) Continent Weight: 64 kg Height:  5' 11"  (180.3 cm) (per-amputation height)  BEHAVIORAL SYMPTOMS/MOOD NEUROLOGICAL BOWEL NUTRITION STATUS      Incontinent Diet (Carb modified, fluid restriction 2019m)  AMBULATORY STATUS COMMUNICATION OF NEEDS Skin   Limited Assist Verbally Other (Comment) (L upper arm fistula)  Personal Care Assistance Level of Assistance  Bathing, Feeding, Dressing Bathing Assistance: Limited assistance Feeding assistance: Limited assistance Dressing Assistance: Limited assistance     Functional  Limitations Info  Sight, Hearing, Speech Sight Info: Adequate Hearing Info: Adequate Speech Info:  (Missing teeth)    SPECIAL CARE FACTORS FREQUENCY   (Dialysis due to COVID result patient to go to [facility] MWF 12:00pm. Patient would treat on Cohort shift for 10 days when nonCOVID. Due toCOVID patient treat for 20 days. [place] to contact clinic for exact duration of isolation treatments.)                    Contractures Contractures Info: Not present    Additional Factors Info  Code Status, Allergies Code Status Info: Full Code Allergies Info: Methotrexate Vancomycin Cefepime  Tape           Current Medications (07/30/2021):  This is the current hospital active medication list Current Facility-Administered Medications  Medication Dose Route Frequency Provider Last Rate Last Admin   acetaminophen (TYLENOL) tablet 500 mg  500 mg Oral Q6H PRN Cherene Altes, MD   500 mg at 07/30/21 1314   albuterol (VENTOLIN HFA) 108 (90 Base) MCG/ACT inhaler 2 puff  2 puff Inhalation Q4H PRN Ivor Costa, MD       amoxicillin-clavulanate (AUGMENTIN) 500-125 MG per tablet 500 mg  1 tablet Oral BID Joette Catching T, MD   500 mg at 07/30/21 0910   atorvastatin (LIPITOR) tablet 80 mg  80 mg Oral Daily Ivor Costa, MD   80 mg at 07/30/21 0911   carvedilol (COREG) tablet 25 mg  25 mg Oral BID Ivor Costa, MD   25 mg at 07/30/21 8144   Chlorhexidine Gluconate Cloth 2 % PADS 6 each  6 each Topical Q0600 Murlean Iba, MD   6 each at 07/30/21 0533   dextromethorphan-guaiFENesin ([place] DM) 30-600 MG per 12 hr tablet 1 tablet  1 tablet Oral BID PRN Ivor Costa, MD       dextrose 50 % solution 50 mL  50 mL Intravenous PRN Ivor Costa, MD       epoetin alfa (EPOGEN) injection 4,000 Units  4,000 Units Intravenous Q M,W,F-HD Murlean Iba, MD   4,000 Units at 07/29/21 1255   feeding supplement (ENSURE ENLIVE / ENSURE PLUS) liquid 237 mL  237 mL Oral TID BM Ivor Costa, MD   237 mL at 07/30/21  1310   gabapentin (NEURONTIN) capsule 100 mg  100 mg Oral QHS Joette Catching T, MD   100 mg at 07/29/21 2217   hydrALAZINE (APRESOLINE) injection 5 mg  5 mg Intravenous Q2H PRN Ivor Costa, MD   5 mg at 07/30/21 8185   hydrALAZINE (APRESOLINE) tablet 100 mg  100 mg Oral Q8H Wouk, Ailene Rud, MD   100 mg at 07/30/21 1309   influenza vac split quadrivalent PF (FLUARIX) injection 0.5 mL  0.5 mL Intramuscular Tomorrow-1000 Ivor Costa, MD       insulin aspart (novoLOG) injection 0-6 Units  0-6 Units Subcutaneous TID WC Cherene Altes, MD   2 Units at 07/30/21 1241   levETIRAcetam (KEPPRA) tablet 500 mg  500 mg Oral Daily Gwynne Edinger, MD   500 mg at 07/30/21 1305   lisinopril (ZESTRIL) tablet 40 mg  40 mg Oral Daily Ivor Costa, MD   40 mg at 07/30/21 0911   LORazepam (ATIVAN) injection 1 mg  1 mg Intravenous Q2H PRN Ivor Costa, MD  multivitamin (RENA-VIT) tablet 1 tablet  1 tablet Oral QHS Cherene Altes, MD   1 tablet at 07/29/21 2215   ondansetron [facility]) injection 4 mg  4 mg Intravenous Q6H PRN Cherene Altes, MD   4 mg at 07/28/21 1748   pantoprazole (PROTONIX) injection 40 mg  40 mg Intravenous Q12H Gwynne Edinger, MD   40 mg at 07/30/21 0912   sevelamer carbonate (RENVELA) tablet 800 mg  800 mg Oral TID WC Ivor Costa, MD   800 mg at 07/30/21 1242     Discharge Medications: Please see discharge summary for a list of discharge medications.  Relevant Imaging Results:  Relevant Lab Results:   Additional Information SS # [id number]  Pete Pelt, RN

## 2021-07-30 NOTE — Progress Notes (Signed)
[name]  YQI:[id number] DOB: 1960-12-28 DOA: 07/27/2021 PCP: [name], MD    Brief Narrative:  [phone] with a history of ESRD on HD MWF, hidradenitis suppurativa on [name], HTN, HLD, DM2, CVA, ICH, DVT, B BKA, Crohn's disease, anemia of chronic kidney disease, seizure disorder, mild cognitive impairment, systolic congestive heart failure with EF 30-35%, and BPH who presented to the ER with altered mental status.  Of note patient reportedly had a positive COVID test 07/06/21 (?home test), but at that time did not have significant symptoms.  The day of his admission he was found to be confused and somnolent by his wife.  CBG was noted to be 37.  He was treated with glucagon and D50 by EMS and his CBG rapidly improved.  By the time he arrived in the ER he was waking up.  CT head was negative for acute process.  CXR suggested pulmonary edema and possible right infiltrate.  Exam in the ED noted a sacral ulcer.  Date of Positive COVID Test:  07/06/21 (?home test) and 07/27/21 (in ED)  Consultants:  None  Code Status: FULL CODE  Antimicrobials:  Zosyn 10/10 >10/11, Augmentin 10/11>  DVT prophylaxis: None due to severe anemia and bilateral BKA  Subjective: At the time of my visit the patient is alert and conversant.  Says feeling fine. Tolerated dialysis. No chest pain. No dyspnea or cough. Tolerating diet. One episode melanotic/maroon stool per nursing yesterday afternoon. None since. No abd pain or vomiting  Assessment & Plan:  Suspected aspiration pneumonia - sepsis Elevated WBC, hypothermia, and suspected bacterial pneumonia at time of presentation - continue empiric antibiotic for short course. Abx started 10/10. Continuing augmentin, plan to complete 5 day course (last day 10/14). Much improved.  GI bleeding? Anemia One episode maroon/melanotic stool yesterday per nursing. Hgb 6.2 yesterday, baseline closer to 7. Was transfused 1 unit w/ appropriate rise today to 7.1. had  egd/colonoscopy earlier this year - duodenitis, some colonic polyps, rectal erythema, otherwise unremarkable. Wife does say patient has very dark stool at baseline. Unclear if represents true gi bleed - asa on hold - pantop iv bid ordered - gi to see - monitor h/h  Acute metabolic encephalopathy  CT head unrevealing -likely simply due to hypoglycemia which then in turn led to aspiration. Appears to be back to baseline  DM2 uncontrolled with renal failure and severe hypoglycemia Unclear what initially led to the patient's hypoglycemia but this likely set off the events that ultimately led to AMS and resultant aspiration and the need for hospitalization - monitor CBG trend closely -avoid insulin for now. CGBs wnl last 24 hrs  HLD Continue Lipitor  HTN Continue usual home medications  ESRD on HD MWF Nephrology has been alerted and will cont his HD tx   Seizure disorder Continue usual seizure medications  PVD status post bilateral BKA  Chronic systolic CHF -EF 64-33% Volume management per HD -no evidence of acute volume overload presently  Hidradentis Follow wound care recommendations. CT neg for osteo/abscess. On humira as outpt  Anemia of CKD Hgb 6s today. No bleeding. Receiving epo w/ dialysis - will transfuse one unit  Hypothermia Felt to be related to severe hypoglycemia -currently normothermic  COVID infection Suspect this represents a persisting positive test despite lack of an active infectious infection    Family Communication: wife updated telephonically 10/13 Status is: Inpatient  Remains inpatient appropriate because:Inpatient level of care appropriate due to severity of illness  Dispo: The patient  is from: Home              Anticipated d/c is to: tomorrow              Patient currently is not medically stable to d/c.   Difficult to place patient No    Objective: Blood pressure (!) 191/84, pulse 70, temperature 98.2 F (36.8 C), resp. rate 16, height  5' 11"  (1.803 m), weight 64 kg, SpO2 99 %.  Intake/Output Summary (Last 24 hours) at 07/30/2021 0949 Last data filed at 07/30/2021 0100 Gross per 24 hour  Intake 727 ml  Output 502 ml  Net 225 ml   Filed Weights   07/29/21 0500 07/29/21 0944 07/29/21 1300  Weight: 64.4 kg 64.3 kg 64 kg    Examination: General: No acute respiratory distress Lungs: Mild bibasilar crackles without wheezing Cardiovascular: Regular rate and rhythm without murmur  Abdomen: Nontender, nondistended, soft, bowel sounds positive, no rebound, no ascites, no appreciable mass Extremities: No significant cyanosis, clubbing, or edema of B LE remnants   CBC: Recent Labs  Lab 07/27/21 0723 07/28/21 0417 07/29/21 0502 07/29/21 2211 07/30/21 0426  WBC 16.6*   < > 8.8 9.2 9.4  NEUTROABS 15.4*  --   --   --   --   HGB 6.0*   < > 6.2* 7.8* 7.1*  HCT 20.0*   < > 20.3* 25.2* 23.5*  MCV 98.0   < > 96.7 94.4 93.6  PLT 224   < > 171 164 170   < > = values in this interval not displayed.   Basic Metabolic Panel: Recent Labs  Lab 07/27/21 0723 07/28/21 0417 07/30/21 0426  NA 135 135 133*  K 4.4 4.3 4.0  CL 98 96* 96*  CO2 29 28 30   GLUCOSE 142* 252* 235*  BUN 58* 43* 40*  CREATININE 5.24* 3.37* 3.33*  CALCIUM 9.1 8.7* 8.2*  MG 1.9  --   --    GFR: Estimated Creatinine Clearance: 21.6 mL/min (A) (by C-G formula based on SCr of 3.33 mg/dL (H)).  Liver Function Tests: Recent Labs  Lab 07/27/21 0723  AST 20  ALT 22  ALKPHOS 94  BILITOT 0.6  PROT 7.0  ALBUMIN 2.3*   Recent Labs  Lab 07/27/21 0723  LIPASE 31    Coagulation Profile: Recent Labs  Lab 07/27/21 0723  INR 1.2    HbA1C: Hemoglobin A1C  Date/Time Value Ref Range Status  07/24/2014 03:24 PM 8.3 (H) 4.2 - 6.3 % Final    Comment:    The American Diabetes Association recommends that a primary goal of therapy should be <7% and that physicians should reevaluate the treatment regimen in patients with HbA1c values consistently  >8%.    Hgb A1c MFr Bld  Date/Time Value Ref Range Status  05/18/2021 05:47 AM 6.0 (H) 4.8 - 5.6 % Final    Comment:    (NOTE) Pre diabetes:          5.7%-6.4%  Diabetes:              >6.4%  Glycemic control for   <7.0% adults with diabetes   02/06/2021 05:56 PM 6.3 (H) 4.8 - 5.6 % Final    Comment:    (NOTE) Pre diabetes:          5.7%-6.4%  Diabetes:              >6.4%  Glycemic control for   <7.0% adults with diabetes     CBG:  Recent Labs  Lab 07/29/21 0928 07/29/21 1445 07/29/21 1636 07/29/21 2129 07/30/21 0719  GLUCAP 110* 124* 195* 225* 193*    Recent Results (from the past 240 hour(s))  Urine Culture     Status: None   Collection Time: 07/27/21  7:24 AM   Specimen: In/Out Cath Urine  Result Value Ref Range Status   Specimen Description   Final    IN/OUT CATH URINE Performed at [facility], [address] [place], [place] [address]    Special Requests   Final    NONE Performed at [facility], [address] [place], [place] [address]    Culture   Final    NO GROWTH Performed at [facility], [place] [address], [place], [place] [address]    Report Status 07/28/2021 FINAL  Final  Aerobic Culture w Gram Stain (superficial specimen)     Status: None (Preliminary result)   Collection Time: 07/27/21  7:48 AM   Specimen: Wound  Result Value Ref Range Status   Specimen Description   Final    WOUND Performed at [facility], [address] [place], [place] [address]    Special Requests   Final    NONE Performed at [facility], [address] [place], [place] [address]    Gram Stain   Final    FEW WBC PRESENT,BOTH PMN AND MONONUCLEAR RARE GRAM POSITIVE COCCI IN PAIRS    Culture   Final    CULTURE REINCUBATED FOR BETTER GROWTH Performed at [facility], [place] [address], [place], [place] [address]    Report Status PENDING  Incomplete  Resp Panel by RT-PCR (Flu A&B, Covid) Nasopharyngeal Swab      Status: Abnormal   Collection Time: 07/27/21  7:55 AM   Specimen: Nasopharyngeal Swab; Nasopharyngeal(NP) swabs in vial transport medium  Result Value Ref Range Status   SARS Coronavirus 2 by RT PCR POSITIVE (A) NEGATIVE Final    Comment: RESULT CALLED TO, READ BACK BY AND VERIFIED WITH: Ricke Hey, RN 0900 07/27/21 GM (NOTE) SARS-CoV-2 target nucleic acids are DETECTED.  The SARS-CoV-2 RNA is generally detectable in upper respiratory specimens during the acute phase of infection. Positive results are indicative of the presence of the identified virus, but do not rule out bacterial infection or co-infection with other pathogens not detected by the test. Clinical correlation with patient history and other diagnostic information is necessary to determine patient infection status. The expected result is Negative.  Fact Sheet for Patients: EntrepreneurPulse.com.au  Fact Sheet for Healthcare Providers: IncredibleEmployment.be  This test is not yet approved or cleared by the [place] FDA and  has been authorized for detection and/or diagnosis of SARS-CoV-2 by FDA under an Emergency Use Authorization (EUA).  This EUA will remain in effect (meaning this test can be  used) for the duration of  the COVID-19 declaration under Section 564(b)(1) of the Act, 21 U.S.C. section 360bbb-3(b)(1), unless the authorization is terminated or revoked sooner.     Influenza A by PCR NEGATIVE NEGATIVE Final   Influenza B by PCR NEGATIVE NEGATIVE Final    Comment: (NOTE) The Xpert Xpress SARS-CoV-2/FLU/RSV plus assay is intended as an aid in the diagnosis of influenza from Nasopharyngeal swab specimens and should not be used as a sole basis for treatment. Nasal washings and aspirates are unacceptable for Xpert Xpress SARS-CoV-2/FLU/RSV testing.  Fact Sheet for Patients: EntrepreneurPulse.com.au  Fact Sheet for Healthcare  Providers: IncredibleEmployment.be  This test is  not yet approved or cleared by the [place] and has been authorized for detection and/or diagnosis of SARS-CoV-2 by FDA under an Emergency Use Authorization (EUA). This EUA will remain in effect (meaning this test can be used) for the duration of the COVID-19 declaration under Section 564(b)(1) of the Act, 21 U.S.C. section 360bbb-3(b)(1), unless the authorization is terminated or revoked.  Performed at [facility], [address] [place], [place] [address]   Blood culture (routine single)     Status: None (Preliminary result)   Collection Time: 07/27/21  7:55 AM   Specimen: BLOOD  Result Value Ref Range Status   Specimen Description BLOOD RIGHT ARM  Final   Special Requests   Final    BOTTLES DRAWN AEROBIC AND ANAEROBIC Blood Culture results may not be optimal due to an excessive volume of blood received in culture bottles   Culture   Final    NO GROWTH 3 DAYS Performed at [facility], [address] [place] [address], [place], [place] [address]    Report Status PENDING  Incomplete     Scheduled Meds:  amoxicillin-clavulanate  1 tablet Oral BID   atorvastatin  80 mg Oral Daily   carvedilol  25 mg Oral BID   Chlorhexidine Gluconate Cloth  6 each Topical Q0600   epoetin (EPOGEN/PROCRIT) injection  4,000 Units Intravenous Q M,W,F-HD   feeding supplement  237 mL Oral TID BM   gabapentin  100 mg Oral QHS   hydrALAZINE  100 mg Oral Q8H   influenza vac split quadrivalent PF  0.5 mL Intramuscular Tomorrow-1000   insulin aspart  0-6 Units Subcutaneous TID WC   levETIRAcetam  750 mg Oral Q1200   lisinopril  40 mg Oral Daily   multivitamin  1 tablet Oral QHS   pantoprazole (PROTONIX) IV  40 mg Intravenous Q12H   sevelamer carbonate  800 mg Oral TID WC   Continuous Infusions:     LOS: 3 days   Laurey Arrow, MD   If 7PM-7AM, please contact night-coverage per Amion 07/30/2021, 9:49 AM

## 2021-07-30 NOTE — Consult Note (Signed)
[place] Nurse Consult Note: Patient receiving care in [facility]. Patient is Covid +. Reason for Consult: 3 small, pinpoint sized areas on buttocks and one hole on the anterior scrotum that are draining Wound type: Patient has known diagnosis of Hidradenitis suppurativa. Patient is on [name]. See Dr. Garen Lah note from 07/28/21 at 11:01 a.m.  This is a chronic condition well known for such areas. Pressure Injury POA: Yes/No/NA Measurement: Wound bed: Drainage (amount, consistency, odor)  Periwound: Dressing procedure/placement/frequency: Wash buttocks draining "holes" and scrotal draining "hole" with soap and water, pat dry. Place a size appropriate piece of Aquacel Kellie Simmering [phone]) over the areas, secure with fishnet underwear if possible. Change daily and prn soilage. [place] nurse will not follow at this time.  Please re-consult the [place] team if needed.  Val Riles, RN, MSN, CWOCN, CNS-BC, pager [phone]

## 2021-07-30 NOTE — Chronic Care Management (AMB) (Signed)
  Chronic Care Management   Note  07/30/2021 Name: [name] MRN: [id number] DOB: [birth year]  [name] is a 60 y.o. year old male who is a primary care patient of [name], [name], MD. [name] is currently enrolled in care management services. An additional referral for Licensed Clinical SW was placed.   Follow up plan: Telephone appointment with care management team member scheduled for: 08/04/2021  Julian Hy, [place], [place] Management  Direct Dial: [phone]

## 2021-07-30 NOTE — Progress Notes (Signed)
Patients BP is 169/71. PRN hydralazine available. When attempting to flush R arm IV, patient complained of pain and I noticed swelling at the site. Attempted to insert new PIV in R arm but was unsuccessful. IV team consult placed. Will continue to monitor.

## 2021-07-30 NOTE — Progress Notes (Signed)
[facility], [place] 07/30/21  Subjective:   Hospital day # 3  Patient seen sitting up in chair Tolerating meals Denies nausea and vomiting Per nursing, patient had melena stool yesterday  Objective:  Vital signs in last 24 hours:  Temp:  [98.2 F (36.8 C)-98.8 F (37.1 C)] 98.2 F (36.8 C) (10/13 0719) Pulse Rate:  [65-82] 70 (10/13 0908) Resp:  [14-18] 16 (10/13 0719) BP: (152-199)/(72-84) 191/84 (10/13 0908) SpO2:  [97 %-99 %] 99 % (10/13 0719) Weight:  [64 kg] 64 kg (10/12 1300)  Weight change: -0.1 kg Filed Weights   07/29/21 0500 07/29/21 0944 07/29/21 1300  Weight: 64.4 kg 64.3 kg 64 kg    Intake/Output:    Intake/Output Summary (Last 24 hours) at 07/30/2021 1132 Last data filed at 07/30/2021 0100 Gross per 24 hour  Intake 727 ml  Output 502 ml  Net 225 ml    Physical Exam: General:  No acute distress, sitting in chair  HEENT  anicteric, moist oral mucous membrane  Pulm/lungs  normal breathing effort, room air, clear  CVS/Heart  no rub or gallop  Abdomen:   Soft, nontender  Extremities:  No peripheral edema, b/l BKA  Neurologic:  Alert, oriented, able to follow commands  Skin:  No acute rashes, sacral wound  Left arm AVF  Basic Metabolic Panel:  Recent Labs  Lab 07/27/21 0723 07/28/21 0417 07/30/21 0426  NA 135 135 133*  K 4.4 4.3 4.0  CL 98 96* 96*  CO2 29 28 30   GLUCOSE 142* 252* 235*  BUN 58* 43* 40*  CREATININE 5.24* 3.37* 3.33*  CALCIUM 9.1 8.7* 8.2*  MG 1.9  --   --       CBC: Recent Labs  Lab 07/27/21 0723 07/28/21 0417 07/29/21 0502 07/29/21 2211 07/30/21 0426  WBC 16.6* 9.7 8.8 9.2 9.4  NEUTROABS 15.4*  --   --   --   --   HGB 6.0* 6.9* 6.2* 7.8* 7.1*  HCT 20.0* 21.7* 20.3* 25.2* 23.5*  MCV 98.0 95.2 96.7 94.4 93.6  PLT 224 193 171 164 170       Lab Results  Component Value Date   HEPBSAG NON REACTIVE 07/27/2021   HEPBSAB Reactive (A) 07/27/2021      Microbiology:  Recent  Results (from the past 240 hour(s))  Urine Culture     Status: None   Collection Time: 07/27/21  7:24 AM   Specimen: In/Out Cath Urine  Result Value Ref Range Status   Specimen Description   Final    IN/OUT CATH URINE Performed at [facility], [address], [place], [place] [address]    Special Requests   Final    NONE Performed at [facility], [address], [place], [place] [address]    Culture   Final    NO GROWTH Performed at [facility], [address], [place], [place] [address]    Report Status 07/28/2021 FINAL  Final  Aerobic Culture w Gram Stain (superficial specimen)     Status: None (Preliminary result)   Collection Time: 07/27/21  7:48 AM   Specimen: Wound  Result Value Ref Range Status   Specimen Description   Final    WOUND Performed at [facility], [address], [place], [place] [address]    Special Requests   Final    NONE Performed at [facility], [address], [place], [place] [address]    Gram Stain  Final    FEW WBC PRESENT,BOTH PMN AND MONONUCLEAR RARE GRAM POSITIVE COCCI IN PAIRS    Culture   Final    CULTURE REINCUBATED FOR BETTER GROWTH Performed at [facility], [place] [address] [place], [place] [address]    Report Status PENDING  Incomplete  Resp Panel by RT-PCR (Flu A&B, Covid) Nasopharyngeal Swab     Status: Abnormal   Collection Time: 07/27/21  7:55 AM   Specimen: Nasopharyngeal Swab; Nasopharyngeal(NP) swabs in vial transport medium  Result Value Ref Range Status   SARS Coronavirus 2 by RT PCR POSITIVE (A) NEGATIVE Final    Comment: RESULT CALLED TO, READ BACK BY AND VERIFIED WITH: Ricke Hey, RN 0900 07/27/21 GM (NOTE) SARS-CoV-2 target nucleic acids are DETECTED.  The SARS-CoV-2 RNA is generally detectable in upper respiratory specimens during the acute phase of infection. Positive results are indicative of the presence of the identified virus, but do not rule out  bacterial infection or co-infection with other pathogens not detected by the test. Clinical correlation with patient history and other diagnostic information is necessary to determine patient infection status. The expected result is Negative.  Fact Sheet for Patients: EntrepreneurPulse.com.au  Fact Sheet for Healthcare Providers: IncredibleEmployment.be  This test is not yet approved or cleared by the [place] FDA and  has been authorized for detection and/or diagnosis of SARS-CoV-2 by FDA under an Emergency Use Authorization (EUA).  This EUA will remain in effect (meaning this test can be  used) for the duration of  the COVID-19 declaration under Section 564(b)(1) of the Act, 21 U.S.C. section 360bbb-3(b)(1), unless the authorization is terminated or revoked sooner.     Influenza A by PCR NEGATIVE NEGATIVE Final   Influenza B by PCR NEGATIVE NEGATIVE Final    Comment: (NOTE) The Xpert Xpress SARS-CoV-2/FLU/RSV plus assay is intended as an aid in the diagnosis of influenza from Nasopharyngeal swab specimens and should not be used as a sole basis for treatment. Nasal washings and aspirates are unacceptable for Xpert Xpress SARS-CoV-2/FLU/RSV testing.  Fact Sheet for Patients: EntrepreneurPulse.com.au  Fact Sheet for Healthcare Providers: IncredibleEmployment.be  This test is not yet approved or cleared by the [place] FDA and has been authorized for detection and/or diagnosis of SARS-CoV-2 by FDA under an Emergency Use Authorization (EUA). This EUA will remain in effect (meaning this test can be used) for the duration of the COVID-19 declaration under Section 564(b)(1) of the Act, 21 U.S.C. section 360bbb-3(b)(1), unless the authorization is terminated or revoked.  Performed at [facility], [address] [place], [place] [address]   Blood culture (routine single)     Status: None  (Preliminary result)   Collection Time: 07/27/21  7:55 AM   Specimen: BLOOD  Result Value Ref Range Status   Specimen Description BLOOD RIGHT ARM  Final   Special Requests   Final    BOTTLES DRAWN AEROBIC AND ANAEROBIC Blood Culture results may not be optimal due to an excessive volume of blood received in culture bottles   Culture   Final    NO GROWTH 3 DAYS Performed at [facility], [address], [place], [place] [address]    Report Status PENDING  Incomplete    Coagulation Studies: No results for input(s): LABPROT, INR in the last 72 hours.   Urinalysis: No results for input(s): COLORURINE, LABSPEC, PHURINE, GLUCOSEU, HGBUR, BILIRUBINUR, KETONESUR, PROTEINUR, UROBILINOGEN, NITRITE, LEUKOCYTESUR in the last 72 hours.  Invalid input(s): APPERANCEUR  Imaging: No results found.   Medications:      amoxicillin-clavulanate  1 tablet Oral BID   atorvastatin  80 mg Oral Daily   carvedilol  25 mg Oral BID   Chlorhexidine Gluconate Cloth  6 each Topical Q0600   epoetin (EPOGEN/PROCRIT) injection  4,000 Units Intravenous Q M,W,F-HD   feeding supplement  237 mL Oral TID BM   gabapentin  100 mg Oral QHS   hydrALAZINE  100 mg Oral Q8H   influenza vac split quadrivalent PF  0.5 mL Intramuscular Tomorrow-1000   insulin aspart  0-6 Units Subcutaneous TID WC   levETIRAcetam  500 mg Oral Daily   levETIRAcetam  750 mg Oral Q1200   lisinopril  40 mg Oral Daily   multivitamin  1 tablet Oral QHS   pantoprazole (PROTONIX) IV  40 mg Intravenous Q12H   sevelamer carbonate  800 mg Oral TID WC   acetaminophen, albuterol, dextromethorphan-guaiFENesin, dextrose, hydrALAZINE, LORazepam, ondansetron (ZOFRAN) IV  Assessment/ Plan:  60 y.o. male with  with end stage renal disease on hemodialysis, diabetes mellitus type II, hypertension, CVA, peripheral vascular disease status post bilateral BKA    admitted on 07/27/2021 for Hypoglycemia [E16.2] Sepsis ([place]) [A41.9] Acute on  chronic anemia [D64.9] Sepsis with encephalopathy without septic shock, due to unspecified organism ([place]) [A41.9, R65.20, G93.40]  # ESRD # Secondary hyperparathyroidism of renal origin N 25.81  # Anemia of CKD # Covid -19 positive # Aspiration Pneumonia # GI bleed  Dialysis received yesterday, UF 58m acheived. Next treatment scheduled for Friday Receiving EPO with dialysis treatments. Hgb 7.1. Received 1 unit of RBCs yesterday.   Continue sevelamer with meals Transition to oral Augmentin GI consulted to evaluate bloody stool   LOS: [id number]/13/202211:32 [place]

## 2021-07-31 ENCOUNTER — Inpatient Hospital Stay: Payer: Medicare Other

## 2021-07-31 LAB — BASIC METABOLIC PANEL
Anion gap: 10 (ref 5–15)
BUN: 51 mg/dL — ABNORMAL HIGH (ref 6–20)
CO2: 28 mmol/L (ref 22–32)
Calcium: 8.9 mg/dL (ref 8.9–10.3)
Chloride: 98 mmol/L (ref 98–111)
Creatinine, Ser: 4.48 mg/dL — ABNORMAL HIGH (ref 0.61–1.24)
GFR, Estimated: 14 mL/min — ABNORMAL LOW (ref >60)
Glucose, Bld: 198 mg/dL — ABNORMAL HIGH (ref 70–99)
Potassium: 4.6 mmol/L (ref 3.5–5.1)
Sodium: 136 mmol/L (ref 135–145)

## 2021-07-31 LAB — GLUCOSE, CAPILLARY
Glucose-Capillary: 197 mg/dL — ABNORMAL HIGH (ref 70–99)
Glucose-Capillary: 153 mg/dL — ABNORMAL HIGH (ref 70–99)
Glucose-Capillary: 231 mg/dL — ABNORMAL HIGH (ref 70–99)
Glucose-Capillary: 204 mg/dL — ABNORMAL HIGH (ref 70–99)

## 2021-07-31 LAB — CBC
HCT: 25.5 % — ABNORMAL LOW (ref 39.0–52.0)
Hemoglobin: 7.9 g/dL — ABNORMAL LOW (ref 13.0–17.0)
MCH: 28.7 pg (ref 26.0–34.0)
MCHC: 31 g/dL (ref 30.0–36.0)
MCV: 92.7 fL (ref 80.0–100.0)
Platelets: 166 10*3/uL (ref 150–400)
RBC: 2.75 MIL/uL — ABNORMAL LOW (ref 4.22–5.81)
RDW: 18.3 % — ABNORMAL HIGH (ref 11.5–15.5)
WBC: 11.6 10*3/uL — ABNORMAL HIGH (ref 4.0–10.5)
nRBC: 0 % (ref 0.0–0.2)

## 2021-07-31 LAB — HEMOGLOBIN A1C
Hgb A1c MFr Bld: 6.5 % — ABNORMAL HIGH (ref 4.8–5.6)
Mean Plasma Glucose: 140 mg/dL

## 2021-07-31 MED ORDER — PANTOPRAZOLE SODIUM 40 MG PO TBEC
40.0000 mg | DELAYED_RELEASE_TABLET | Freq: Every day | ORAL | Status: DC
  Administered 2021-08-01 – 2021-08-02 (×2): 40 mg via ORAL
  Filled 2021-07-31 (×2): qty 1

## 2021-07-31 MED ORDER — OXYMETAZOLINE HCL 0.05 % NA SOLN
2.0000 | Freq: Two times a day (BID) | NASAL | Status: DC | PRN
  Administered 2021-07-31: 17:00:00 2 via NASAL
  Filled 2021-07-31: qty 15

## 2021-07-31 MED ORDER — AMLODIPINE BESYLATE 10 MG PO TABS: 5.0000 mg | ORAL_TABLET | Freq: Every evening | ORAL | Status: DC

## 2021-07-31 MED ORDER — CLONIDINE HCL 0.1 MG PO TABS
0.1000 mg | ORAL_TABLET | Freq: Two times a day (BID) | ORAL | Status: DC
  Administered 2021-07-31 – 2021-08-02 (×5): 0.1 mg via ORAL
  Filled 2021-07-31 (×5): qty 1

## 2021-07-31 MED ORDER — AMLODIPINE BESYLATE 10 MG PO TABS
10.0000 mg | ORAL_TABLET | Freq: Every evening | ORAL | Status: DC
  Administered 2021-07-31 – 2021-08-01 (×2): 10 mg via ORAL
  Filled 2021-07-31 (×2): qty 1

## 2021-07-31 MED ORDER — AMLODIPINE BESYLATE 5 MG PO TABS: 5.0000 mg | ORAL_TABLET | Freq: Every evening | ORAL | Status: DC

## 2021-07-31 MED ORDER — SPIRONOLACTONE 25 MG PO TABS
25.0000 mg | ORAL_TABLET | Freq: Every day | ORAL | Status: DC
  Administered 2021-07-31 – 2021-08-01 (×2): 25 mg via ORAL
  Filled 2021-07-31 (×2): qty 1

## 2021-07-31 MED ORDER — PENTAFLUOROPROP-TETRAFLUOROETH EX AERO
INHALATION_SPRAY | CUTANEOUS | Status: AC
  Filled 2021-07-31: qty 30

## 2021-07-31 NOTE — Progress Notes (Signed)
0330 Called into patient's room for nosebleed. Patient stated he had been coughing and blew his nose when his nose started bleeding. Patient then blew his nose and large clot approximately 2.0 cm came out onto tissue. Pressure squeeze applied to bilateral nostrils for approximately five minutes.Several bloody tissues observed in his cup and sheet had small saucer size area of blood on it. No further bleeding observed.

## 2021-07-31 NOTE — Consult Note (Signed)
[name], Read [id number] Jan 25, 1961 [name] Nearing, MD  Reason for Consult: Epistaxis, hemoptysis Requesting Physician: Gwynne Edinger, MD Consulting Physician: [name]  HPI: This 60 y.o. year old male was admitted on 07/27/2021 for Hypoglycemia [E16.2] Sepsis ([place]) [A41.9] Acute on chronic anemia [D64.9] Sepsis with encephalopathy without septic shock, due to unspecified organism ([place]) [A41.9, R65.20, G93.40]. Patient is 60yo with a history of ESRD on HD MWF, hidradenitis suppurativa on [name], HTN, HLD, DM2, CVA, ICH, DVT, B BKA, Crohn's disease, anemia of chronic kidney disease, seizure disorder, mild cognitive impairment, systolic congestive heart failure with EF 30-35%, and BPH who presented to the ER with altered mental status. Of note patient reportedly had a positive COVID test 07/06/21 (?home test), but at that time did not have significant symptoms. The day of his admission he was found to be confused and somnolent by his wife. CBG was noted to be 37. He was treated with glucagon and D50 by EMS and his CBG rapidly improved. By the time he arrived in the ER he was waking up. CT head was negative for acute process. CXR suggested pulmonary edema and possible right infiltrate. He has been treated for suspected aspiration pneumonia.  There has also been some question of a possible underlying issue with GI bleed.  He has had anemia and maroon and melanotic stools at least once while here.  GI advise monitoring for now.  Apparently he's had some hemoptysis that started last night and appears to possibly be from his nose.  He had some blood from the right nostril, mostly bleeding down the back of the throat.  His blood pressure has been elevated.  Allergies:  Allergies  Allergen Reactions   Methotrexate Other (See Comments)    Blood count drops   Vancomycin Shortness Of Breath    Eyes watering, SOB, wheezing   Cefepime Other (See Comments)    Confusion    Tape     Medications:   Medications Prior to Admission  Medication Sig Dispense Refill   acetaminophen (TYLENOL) 500 MG tablet Take 1,000 mg by mouth daily as needed for moderate pain or headache.      aspirin 81 MG chewable tablet Chew 81 mg by mouth daily.     atorvastatin (LIPITOR) 80 MG tablet TAKE 1 TABLET(80 MG) BY MOUTH DAILY (Patient taking differently: Take 80 mg by mouth daily.) 90 tablet 3   carvedilol (COREG) 25 MG tablet Take 1 tablet (25 mg total) by mouth 2 (two) times daily. 60 tablet 12   gabapentin (NEURONTIN) 400 MG capsule Take 400 mg by mouth at bedtime.     [name] PEN 40 MG/0.8ML PNKT Inject 40 mg into the skin once a week.     hydrALAZINE (APRESOLINE) 100 MG tablet Take 1 tablet (100 mg total) by mouth every 8 (eight) hours. 90 tablet 1   Insulin Aspart FlexPen (NOVOLOG) 100 UNIT/ML Inject 2-12 Units into the skin 4 (four) times daily -  before meals and at bedtime. Per sliding scale 200-249= 2 units 250-299= 4 units 300-349= 6 units 350-399= 8 units 400-449= 10 units  450-499= 12 units 500 or greater= Call MD     levETIRAcetam (KEPPRA) 750 MG tablet Take 750 mg by mouth daily at 12 noon.     lisinopril (ZESTRIL) 40 MG tablet Take 40 mg by mouth daily.     Multiple Vitamin (THEREMS PO) Take 1 tablet by mouth daily. multivitamin     sevelamer carbonate (RENVELA) 800 MG tablet TAKE  1 TABLET(800 MG) BY MOUTH THREE TIMES DAILY (Patient taking differently: Take 800 mg by mouth 3 (three) times daily with meals.) 270 tablet 0   spironolactone (ALDACTONE) 25 MG tablet Take 25 mg by mouth at bedtime.     Alcohol Swabs PADS Use as directed to check blood sugar three times daily for insulin dependent type 2 diabetes. 100 each 12   Blood Glucose Monitoring Suppl (ONE TOUCH ULTRA 2) w/Device KIT Use as directed to check blood sugar three times daily. E11.9 1 each 0   feeding supplement (ENSURE ENLIVE / ENSURE PLUS) LIQD Take 237 mLs by mouth 3 (three) times daily between meals. 237 mL 12   isosorbide  mononitrate (IMDUR) 30 MG 24 hr tablet Take 1 tablet (30 mg total) by mouth daily. (Patient not taking: Reported on 07/27/2021) 30 tablet 2   levETIRAcetam (KEPPRA) 500 MG tablet Take 1 tablet (500 mg total) by mouth daily. (Patient not taking: Reported on 07/27/2021) 90 tablet 0  .  Current Facility-Administered Medications  Medication Dose Route Frequency Provider Last Rate Last Admin   acetaminophen (TYLENOL) tablet 500 mg  500 mg Oral Q6H PRN Cherene Altes, MD   500 mg at 07/31/21 0437   albuterol (VENTOLIN HFA) 108 (90 Base) MCG/ACT inhaler 2 puff  2 puff Inhalation Q4H PRN Ivor Costa, MD       amLODipine (NORVASC) tablet 10 mg  10 mg Oral QPM Wouk, Ailene Rud, MD       amoxicillin-clavulanate (AUGMENTIN) 500-125 MG per tablet 500 mg  1 tablet Oral BID Joette Catching T, MD   500 mg at 07/31/21 1244   atorvastatin (LIPITOR) tablet 80 mg  80 mg Oral Daily Ivor Costa, MD   80 mg at 07/31/21 1232   carvedilol (COREG) tablet 25 mg  25 mg Oral BID Ivor Costa, MD   25 mg at 07/31/21 1233   Chlorhexidine Gluconate Cloth 2 % PADS 6 each  6 each Topical Q0600 Murlean Iba, MD   6 each at 07/31/21 1233   cloNIDine (CATAPRES) tablet 0.1 mg  0.1 mg Oral BID Wouk, Ailene Rud, MD       dextromethorphan-guaiFENesin [facility] DM) 30-600 MG per 12 hr tablet 1 tablet  1 tablet Oral BID PRN Ivor Costa, MD   1 tablet at 07/31/21 0051   dextrose 50 % solution 50 mL  50 mL Intravenous PRN Ivor Costa, MD       epoetin alfa (EPOGEN) injection 4,000 Units  4,000 Units Intravenous Q M,W,F-HD Murlean Iba, MD   4,000 Units at 07/31/21 1025   feeding supplement (ENSURE ENLIVE / ENSURE PLUS) liquid 237 mL  237 mL Oral TID BM Ivor Costa, MD   237 mL at 07/31/21 1232   gabapentin (NEURONTIN) capsule 100 mg  100 mg Oral QHS Joette Catching T, MD   100 mg at 07/30/21 2129   hydrALAZINE (APRESOLINE) injection 5 mg  5 mg Intravenous Q2H PRN Ivor Costa, MD   5 mg at 07/31/21 5916   hydrALAZINE (APRESOLINE) tablet  100 mg  100 mg Oral Q8H Wouk, Ailene Rud, MD   100 mg at 07/30/21 2133   influenza vac split quadrivalent PF (FLUARIX) injection 0.5 mL  0.5 mL Intramuscular Tomorrow-1000 Ivor Costa, MD       insulin aspart (novoLOG) injection 0-6 Units  0-6 Units Subcutaneous TID WC Cherene Altes, MD   1 Units at 07/31/21 1234   levETIRAcetam (KEPPRA) tablet 500 mg  500 mg Oral Daily  Wouk, Ailene Rud, MD   500 mg at 07/31/21 1244   lisinopril (ZESTRIL) tablet 40 mg  40 mg Oral Daily Ivor Costa, MD   40 mg at 07/31/21 1233   LORazepam (ATIVAN) injection 1 mg  1 mg Intravenous Q2H PRN Ivor Costa, MD       multivitamin (RENA-VIT) tablet 1 tablet  1 tablet Oral QHS Cherene Altes, MD   1 tablet at 07/30/21 2128   ondansetron (ZOFRAN) injection 4 mg  4 mg Intravenous Q6H PRN Cherene Altes, MD   4 mg at 07/28/21 1748   pantoprazole (PROTONIX) injection 40 mg  40 mg Intravenous Q12H Gwynne Edinger, MD   40 mg at 07/31/21 1234   pentafluoroprop-tetrafluoroeth (GEBAUERS) aerosol            sevelamer carbonate (RENVELA) tablet 800 mg  800 mg Oral TID WC Ivor Costa, MD   800 mg at 07/31/21 1232   spironolactone (ALDACTONE) tablet 25 mg  25 mg Oral QHS Wouk, Ailene Rud, MD        PMH:  Past Medical History:  Diagnosis Date   Acute metabolic encephalopathy 11/19/252   Anemia    Crohn disease ([place])    Diabetes mellitus without complication ([place])    DVT of lower extremity (deep venous thrombosis) ([place]) 2016   Empyema ([place]) 05/20/2017   Encephalopathy 12/04/2017   Fall at home, initial encounter 09/12/2020   Hidradenitis suppurativa    Hypertension    ICH (intracerebral hemorrhage) ([place])    Peritonitis ([place]) 04/21/2017   Pyogenic arthritis of knee ([place]) 02/04/2016   Renal disorder    Sepsis ([place]) 01/12/2018   Stroke ([place])     Fam Hx:  Family History  Problem Relation Age of Onset   Irritable bowel syndrome Sister    Diabetes Sister    Heart disease Mother    Diabetes Mother    Heart  disease Father    Rheumatic fever Father        as child   Psoriasis Brother    Arthritis Brother    Diabetes Sister    Diabetes Sister     Soc Hx:  Social History   Socioeconomic History   Marital status: Married    Spouse name: Not on file   Number of children: 2   Years of education: Not on file   Highest education level: Associate degree: occupational, Hotel manager, or vocational program  Occupational History   Occupation: disable  Tobacco Use   Smoking status: Former   Smokeless tobacco: Never   Tobacco comments:    smokes marijuana  Vaping Use   Vaping Use: Never used  Substance and Sexual Activity   Alcohol use: No   Drug use: Yes    Types: Marijuana   Sexual activity: Not Currently  Other Topics Concern   Not on file  Social History Narrative   Not on file   Social Determinants of Health   Financial Resource Strain: Not on file  Food Insecurity: No Food Insecurity   Worried About Running Out of Food in the Last Year: Never true   [place] in the Last Year: Never true  Transportation Needs: No Transportation Needs   Lack of Transportation (Medical): No   Lack of Transportation (Non-Medical): No  Physical Activity: Not on file  Stress: Not on file  Social Connections: Not on file  Intimate Partner Violence: Not on file    PSH:  Past Surgical History:  Procedure Laterality  Date   A/V FISTULAGRAM Left 02/24/2021   Procedure: A/V FISTULAGRAM;  Surgeon: Katha Cabal, MD;  Location: [place] [facility];  Service: Cardiovascular;  Laterality: Left;   ABDOMINAL SURGERY     AMPUTATION FINGER Left 06/2019   PR AMPUTATION LONG FINGER/THUMB+FLAPS [facility]   ANGIOPLASTY Left    left [facility]-pop at [facility] 04-11-2018   BELOW KNEE LEG AMPUTATION Right 08/2017   [facility]   COLONOSCOPY     COLONOSCOPY WITH PROPOFOL N/A 10/28/2020   Procedure: COLONOSCOPY WITH PROPOFOL;  Surgeon: Lin Landsman, MD;  Location: [place];  Service: Gastroenterology;  Laterality:  N/A;   COLONOSCOPY WITH PROPOFOL N/A 11/21/2020   Procedure: COLONOSCOPY WITH PROPOFOL;  Surgeon: [name], MD;  [facility] ENDOSCOPY;  Service: Endoscopy;  Laterality: N/A;   DIALYSIS/PERMA CATHETER INSERTION N/A 12/09/2017   Procedure: DIALYSIS/PERMA CATHETER INSERTION;  Surgeon: Katha Cabal, MD;  Location: [place] [facility];  Service: Cardiovascular;  Laterality: N/A;   DIALYSIS/PERMA CATHETER INSERTION N/A 12/12/2017   Procedure: DIALYSIS/PERMA CATHETER INSERTION;  Surgeon: Algernon Huxley, MD;  Location: [place] CV [facility];  Service: Cardiovascular;  Laterality: N/A;   DIALYSIS/PERMA CATHETER REMOVAL Left 12/09/2017   Procedure: DIALYSIS/PERMA CATHETER REMOVAL;  Surgeon: Katha Cabal, MD;  Location: [place] CV LAB;  Service: Cardiovascular;  Laterality: Left;   ESOPHAGOGASTRODUODENOSCOPY (EGD) WITH PROPOFOL N/A 11/20/2020   Procedure: ESOPHAGOGASTRODUODENOSCOPY (EGD) WITH PROPOFOL;  Surgeon: [name], MD;  Location: [facility] ENDOSCOPY;  Service: Endoscopy;  Laterality: N/A;   KNEE SURGERY Left 02/04/2016   [facility]   LEG AMPUTATION THROUGH LOWER TIBIA AND FIBULA Left 06/22/2018   [facility]   LOWER EXTREMITY ANGIOGRAPHY Right 08/08/2017   Procedure: Lower Extremity Angiography;  Surgeon: Algernon Huxley, MD;  Location: [place] [facility];  Service: Cardiovascular;  Laterality: Right;   LOWER EXTREMITY ANGIOGRAPHY Right 08/22/2017   Procedure: Lower Extremity Angiography;  Surgeon: Algernon Huxley, MD;  Location: [place] CV LAB;  Service: Cardiovascular;  Laterality: Right;   LOWER EXTREMITY INTERVENTION  08/08/2017   Procedure: LOWER EXTREMITY INTERVENTION;  Surgeon: Algernon Huxley, MD;  Location: [place] CV LAB;  Service: Cardiovascular;;   LOWER EXTREMITY INTERVENTION  08/22/2017   Procedure: LOWER EXTREMITY INTERVENTION;  Surgeon: Algernon Huxley, MD;  Location: [place] [facility];  Service: Cardiovascular;;  . Procedures since admission: No admission procedures for  hospital encounter.  ROS: Review of systems normal other than 12 systems except per HPI.  PHYSICAL EXAM Vitals:  Vitals:   07/31/21 1153 07/31/21 1203  BP: (!) 190/88 (!) 190/88  Pulse: 81 81  Resp: 18 20  Temp: 98.4 F (36.9 C)   SpO2: 100% 98%  . General: Well-developed, Well-nourished in no acute distress Mood: Mood and affect well adjusted, pleasant and cooperative. Orientation: Grossly alert and oriented. Vocal Quality: No hoarseness. Communicates verbally. head and Face: NCAT. No facial asymmetry. No visible skin lesions. No significant facial scars. No tenderness with sinus percussion. Facial strength normal and symmetric. Ears: External ears with normal landmarks, no lesions. External auditory canals free of infection, cerumen impaction or lesions. Tympanic membranes intact with good landmarks and normal mobility on pneumatic otoscopy. No middle ear effusion. Hearing: Speech reception grossly normal. Nose: External nose normal with midline dorsum and no lesions or deformity. Nasal Cavity reveals essentially midline septum with normal inferior turbinates. Clotted blood in right nasal cavity was suctioned out. No anterior bleeding but has small amount of blood draining down back of throat on  the right Oral Cavity/ Oropharynx: Lips are normal with no lesions. Teeth no frank dental caries. Gingiva healthy with no lesions or gingivitis. Oropharynx including tongue, buccal mucosa, floor of mouth, hard and soft palate, uvula and posterior pharynx free of exudates, erythema or lesions with normal symmetry and hydration. Blood noted in pharynx from nasopharynx area.  Indirect Laryngoscopy/Nasopharyngoscopy: Visualization of the larynx, hypopharynx and nasopharynx is not possible in this setting with routine examination. Neck: Supple and symmetric with no palpable masses, tenderness or crepitance. The trachea is midline. Thyroid gland is soft, nontender and symmetric with no masses or  enlargement. Parotid and submandibular glands are soft, nontender and symmetric, without masses. Lymphatic: Cervical lymph nodes are without palpable lymphadenopathy or tenderness. Respiratory: Normal respiratory effort without labored breathing. Cardiovascular: Carotid pulse shows regular rate and rhythm Neurologic: Cranial Nerves II through XII are grossly intact. Eyes: Gaze and Ocular Motility are grossly normal. PERRLA. No visible nystagmus.  MEDICAL DECISION MAKING: Data Review:  Results for orders placed or performed during the hospital encounter of 07/27/21 (from the past 48 hour(s))  Prepare RBC (crossmatch)     Status: None   Collection Time: 07/29/21  2:28 PM  Result Value Ref Range   Order Confirmation      ORDER PROCESSED BY BLOOD BANK Performed at [facility], [address] [place], [place] [address]   Glucose, capillary     Status: Abnormal   Collection Time: 07/29/21  2:45 PM  Result Value Ref Range   Glucose-Capillary 124 (H) 70 - 99 mg/dL    Comment: Glucose reference range applies only to samples taken after fasting for at least 8 hours.  Glucose, capillary     Status: Abnormal   Collection Time: 07/29/21  4:36 PM  Result Value Ref Range   Glucose-Capillary 195 (H) 70 - 99 mg/dL    Comment: Glucose reference range applies only to samples taken after fasting for at least 8 hours.  Glucose, capillary     Status: Abnormal   Collection Time: 07/29/21  9:29 PM  Result Value Ref Range   Glucose-Capillary 225 (H) 70 - 99 mg/dL    Comment: Glucose reference range applies only to samples taken after fasting for at least 8 hours.  CBC     Status: Abnormal   Collection Time: 07/29/21 10:11 PM  Result Value Ref Range   WBC 9.2 4.0 - 10.5 K/uL   RBC 2.67 (L) 4.22 - 5.81 MIL/uL   Hemoglobin 7.8 (L) 13.0 - 17.0 g/dL    Comment: REPEATED TO VERIFY   HCT 25.2 (L) 39.0 - 52.0 %   MCV 94.4 80.0 - 100.0 fL   MCH 29.2 26.0 - 34.0 pg   MCHC 31.0 30.0 - 36.0 g/dL    RDW 19.0 (H) 11.5 - 15.5 %   Platelets 164 150 - 400 K/uL   nRBC 0.0 0.0 - 0.2 %    Comment: Performed at [facility], [address] [place], [place] [address]  CBC     Status: Abnormal   Collection Time: 07/30/21  4:26 AM  Result Value Ref Range   WBC 9.4 4.0 - 10.5 K/uL   RBC 2.51 (L) 4.22 - 5.81 MIL/uL   Hemoglobin 7.1 (L) 13.0 - 17.0 g/dL   HCT 23.5 (L) 39.0 - 52.0 %   MCV 93.6 80.0 - 100.0 fL   MCH 28.3 26.0 - 34.0 pg   MCHC 30.2 30.0 - 36.0 g/dL   RDW 19.1 (H) 11.5 -  15.5 %   Platelets 170 150 - 400 K/uL   nRBC 0.0 0.0 - 0.2 %    Comment: Performed at [facility], [address] [place], [place] [address]  Basic metabolic panel     Status: Abnormal   Collection Time: 07/30/21  4:26 AM  Result Value Ref Range   Sodium 133 (L) 135 - 145 mmol/L   Potassium 4.0 3.5 - 5.1 mmol/L   Chloride 96 (L) 98 - 111 mmol/L   CO2 30 22 - 32 mmol/L   Glucose, Bld 235 (H) 70 - 99 mg/dL    Comment: Glucose reference range applies only to samples taken after fasting for at least 8 hours.   BUN 40 (H) 6 - 20 mg/dL   Creatinine, Ser 3.33 (H) 0.61 - 1.24 mg/dL   Calcium 8.2 (L) 8.9 - 10.3 mg/dL   GFR, Estimated 20 (L) >60 mL/min    Comment: (NOTE) Calculated using the CKD-EPI Creatinine Equation (2021)    Anion gap 7 5 - 15    Comment: Performed at [facility], [address] [place], [place] [address]  Glucose, capillary     Status: Abnormal   Collection Time: 07/30/21  7:19 AM  Result Value Ref Range   Glucose-Capillary 193 (H) 70 - 99 mg/dL    Comment: Glucose reference range applies only to samples taken after fasting for at least 8 hours.  Glucose, capillary     Status: Abnormal   Collection Time: 07/30/21 11:51 AM  Result Value Ref Range   Glucose-Capillary 213 (H) 70 - 99 mg/dL    Comment: Glucose reference range applies only to samples taken after fasting for at least 8 hours.  Glucose, capillary     Status: Abnormal   Collection Time:  07/30/21  4:39 PM  Result Value Ref Range   Glucose-Capillary 197 (H) 70 - 99 mg/dL    Comment: Glucose reference range applies only to samples taken after fasting for at least 8 hours.  Glucose, capillary     Status: Abnormal   Collection Time: 07/30/21  9:11 PM  Result Value Ref Range   Glucose-Capillary 246 (H) 70 - 99 mg/dL    Comment: Glucose reference range applies only to samples taken after fasting for at least 8 hours.  CBC     Status: Abnormal   Collection Time: 07/31/21  5:35 AM  Result Value Ref Range   WBC 11.6 (H) 4.0 - 10.5 K/uL   RBC 2.75 (L) 4.22 - 5.81 MIL/uL   Hemoglobin 7.9 (L) 13.0 - 17.0 g/dL   HCT 25.5 (L) 39.0 - 52.0 %   MCV 92.7 80.0 - 100.0 fL   MCH 28.7 26.0 - 34.0 pg   MCHC 31.0 30.0 - 36.0 g/dL   RDW 18.3 (H) 11.5 - 15.5 %   Platelets 166 150 - 400 K/uL   nRBC 0.0 0.0 - 0.2 %    Comment: Performed at [facility], [address], [place], [place] [address]  Basic metabolic panel     Status: Abnormal   Collection Time: 07/31/21  5:35 AM  Result Value Ref Range   Sodium 136 135 - 145 mmol/L   Potassium 4.6 3.5 - 5.1 mmol/L   Chloride 98 98 - 111 mmol/L   CO2 28 22 - 32 mmol/L   Glucose, Bld 198 (H) 70 - 99 mg/dL    Comment: Glucose reference range applies only to samples taken after fasting for at least 8 hours.  BUN 51 (H) 6 - 20 mg/dL   Creatinine, Ser 4.48 (H) 0.61 - 1.24 mg/dL   Calcium 8.9 8.9 - 10.3 mg/dL   GFR, Estimated 14 (L) >60 mL/min    Comment: (NOTE) Calculated using the CKD-EPI Creatinine Equation (2021)    Anion gap 10 5 - 15    Comment: Performed at [facility], [address] [place], [place] [address]  Glucose, capillary     Status: Abnormal   Collection Time: 07/31/21  8:27 AM  Result Value Ref Range   Glucose-Capillary 197 (H) 70 - 99 mg/dL    Comment: Glucose reference range applies only to samples taken after fasting for at least 8 hours.  Glucose, capillary     Status: Abnormal   Collection Time:  07/31/21 12:25 PM  Result Value Ref Range   Glucose-Capillary 153 (H) 70 - 99 mg/dL    Comment: Glucose reference range applies only to samples taken after fasting for at least 8 hours.  . No results found.Marland Kitchen   PROCEDURE: Procedure: Right nasal packing to control epistaxis Diagnosis: Epistaxis Indications: Bleeding from posterior nasal cavity Findings:No anterior source of bleeding seen.  Description of Procedure: After discussing procedure and risks  (primarily nose bleed) with the patient, the nose was anesthetized with topical Lidocaine 4% and decongested with phenylephrine. A 10 cm Merocel pack was lubricated with Surgilube and placed into the right nasal cavity. Findings are as noted above. No active bleeding seen around the pack. The patient tolerated the procedure well.  ASSESSMENT: Epistaxis, likely due to hypertension given the posterior source  PLAN: Nose packed. This will need to remain in place x 5 days. Afrin may be sprayed into the pack as needed for breakthrough bleeding.  Ice maybe placed over the bridge of the nose as well to control any mild bleeding.  He should be on prophylactic antibiotics, either Keflex or Augmentin, as long as the pack is in place.  Every effort should be made to control his blood pressure which is likely the reason he has had the bleeding.  If he is to be discharged I can see him back in the office for pack removal but would recommend a blood pressure be better controlled prior to discharge to prevent readmission for further bleeding, particularly given his hematocrit.   [name] Nearing, MD 07/31/2021 1:32 PM

## 2021-07-31 NOTE — Progress Notes (Signed)
0620 into patient's room to check on him.patient had no complaints,but was spitting out bloody sputum,when checked into the cup which he had spit in ;the cup had bloody sputum with small blood clots within it. Patient states it doesn't feel like he is coughing it up from his chest but that it feels like it is draining down to throat from sinuses. Cup filled with tissues and bloody sputum. Sharion Settler NP notified of bloody sputum with blood clots observed & of blood clot and bleeding from his nose at which time bilateral nostrils had pressure applied  times 5 minutes to stop nasal bleeding.Marland Kitchen Hydralazine iv 5 mg given x 2  PRN throughout shift, then his scheduled hydralazine 100 mg given around 2200 didn't seem to help lower his blood pressure which maintained around 170s'. Hydralazine 100 mg tablets for 0600 not given due to dialysis is to occur this am.  Oncoming RN notified.

## 2021-07-31 NOTE — Discharge Summary (Deleted)
[name] OMV:[id number] DOB: May 12, 1961 DOA: 07/27/2021  PCP: [name], MD  Admit date: 07/27/2021 Discharge date: 07/31/2021  Time spent: 35 minutes  Recommendations for Outpatient Follow-up:  Will need hemoglobin checked in 1 week     Discharge Diagnoses:  Principal Problem:   Aspiration pneumonia ([place]) Active Problems:   Hypercholesteremia   Hypertension   End stage renal failure on dialysis ([place])   Seizure disorder (HCC)   S/P bilateral BKA (below knee amputation) (HCC)   Sepsis ([place])   Acute metabolic encephalopathy   Chronic systolic CHF (congestive heart failure) (HCC)   Unstageable pressure ulcer of sacral region ([place])   Type II diabetes mellitus with renal manifestations ([place])   Anemia in ESRD (end-stage renal disease) ([place])   Hypoglycemia   Hypothermia   COVID-19 virus infection   Discharge Condition: stable  Diet recommendation: low sodium  Filed Weights   07/29/21 0944 07/29/21 1300 07/31/21 0837  Weight: 64.3 kg 64 kg 64 kg    History of present illness:  60yo with a history of ESRD on HD MWF, hidradenitis suppurativa on [name], HTN, HLD, DM2, CVA, ICH, DVT, B BKA, Crohn's disease, anemia of chronic kidney disease, seizure disorder, mild cognitive impairment, systolic congestive heart failure with EF 30-35%, and BPH who presented to the ER with altered mental status.  Of note patient reportedly had a positive COVID test 07/06/21 (?home test), but at that time did not have significant symptoms.  The day of his admission he was found to be confused and somnolent by his wife.  CBG was noted to be 37.  He was treated with glucagon and D50 by EMS and his CBG rapidly improved.  By the time he arrived in the ER he was waking up.  CT head was negative for acute process.  CXR suggested pulmonary edema and possible right infiltrate.  Exam in the ED noted a sacral ulcer.   Hospital Course:  Suspected aspiration pneumonia - sepsis Elevated WBC,  hypothermia, and suspected bacterial pneumonia at time of presentation - continue empiric antibiotic for short course. Abx started 10/10, finished 5 day course here.   GI bleeding? Anemia One episode maroon/melanotic stool on 10/12 per nursing. Hgb 6.2 , baseline closer to 7. Was transfused 1 unit w/ appropriate rise today to 7.1, then 7.9 day of discharge. had egd/colonoscopy earlier this year - duodenitis, some colonic polyps, rectal erythema, otherwise unremarkable. Wife does say patient has very dark stool at baseline. Unclear if represents true gi bleed. GI consulted, advised monitoring h/h. - resume asa at discharge - cbc 1 weekw   COVID infection Suspect this represents a persisting positive test despite lack of an active infectious infection. Reported positive home test in September. Asymptomatic.   Acute metabolic encephalopathy  CT head unrevealing -likely simply due to hypoglycemia which then in turn led to aspiration. Appears to be back to baseline   DM2 uncontrolled with renal failure and severe hypoglycemia Unclear what initially led to the patient's hypoglycemia but this likely set off the events that ultimately led to AMS and resultant aspiration and the need for hospitalization - monitor CBG trend closely -avoid insulin for now. CGBs wnl last 24 hrs    ESRD on HD MWF Continued here   Seizure disorder Continue usual seizure medications   PVD status post bilateral BKA   Chronic systolic CHF -EF 62-83% Volume management per HD -no evidence of acute volume overload presently   Hidradentis Follow wound care recommendations. CT neg  for osteo/abscess. On humira as outpt     Procedures: hemodialysis   Consultations: nephrology  Discharge Exam: Vitals:   07/31/21 1049 07/31/21 1100  BP: (!) 184/81 (!) 178/79  Pulse: 79 76  Resp:    Temp:    SpO2: 99% 98%    General: No acute respiratory distress Lungs: Mild bibasilar crackles without  wheezing Cardiovascular: Regular rate and rhythm without murmur  Abdomen: Nontender, nondistended, soft, bowel sounds positive, no rebound, no ascites, no appreciable mass Extremities: No significant cyanosis, clubbing, or edema of B LE remnants     Discharge Instructions   Discharge Instructions     Diet - low sodium heart healthy   Complete by: As directed    Discharge wound care:   Complete by: As directed    Clean and dress daily   Increase activity slowly   Complete by: As directed       Allergies as of 07/31/2021       Reactions   Methotrexate Other (See Comments)   Blood count drops   Vancomycin Shortness Of Breath   Eyes watering, SOB, wheezing   Cefepime Other (See Comments)   Confusion    Tape         Medication List     STOP taking these medications    isosorbide mononitrate 30 MG 24 hr tablet Commonly known as: IMDUR   spironolactone 25 MG tablet Commonly known as: ALDACTONE       TAKE these medications    acetaminophen 500 MG tablet Commonly known as: TYLENOL Take 1,000 mg by mouth daily as needed for moderate pain or headache.   Alcohol Swabs Pads Use as directed to check blood sugar three times daily for insulin dependent type 2 diabetes.   amLODipine 10 MG tablet Commonly known as: NORVASC Take 0.5 tablets (5 mg total) by mouth every evening.   aspirin 81 MG chewable tablet Chew 81 mg by mouth daily.   atorvastatin 80 MG tablet Commonly known as: LIPITOR TAKE 1 TABLET(80 MG) BY MOUTH DAILY What changed: See the new instructions.   carvedilol 25 MG tablet Commonly known as: COREG Take 1 tablet (25 mg total) by mouth 2 (two) times daily.   feeding supplement Liqd Take 237 mLs by mouth 3 (three) times daily between meals.   gabapentin 400 MG capsule Commonly known as: NEURONTIN Take 400 mg by mouth at bedtime.   [name] Pen 40 MG/0.8ML Pnkt Generic drug: Adalimumab Inject 40 mg into the skin once a week.   hydrALAZINE  100 MG tablet Commonly known as: APRESOLINE Take 1 tablet (100 mg total) by mouth every 8 (eight) hours.   Insulin Aspart FlexPen 100 UNIT/ML Commonly known as: NOVOLOG Inject 2-12 Units into the skin 4 (four) times daily -  before meals and at bedtime. Per sliding scale 200-249= 2 units 250-299= 4 units 300-349= 6 units 350-399= 8 units 400-449= 10 units  450-499= 12 units 500 or greater= Call MD   levETIRAcetam 750 MG tablet Commonly known as: KEPPRA Take 750 mg by mouth daily at 12 noon. What changed: Another medication with the same name was removed. Continue taking this medication, and follow the directions you see here.   lisinopril 40 MG tablet Commonly known as: ZESTRIL Take 40 mg by mouth daily.   ONE TOUCH ULTRA 2 w/Device Kit Use as directed to check blood sugar three times daily. E11.9   sevelamer carbonate 800 MG tablet Commonly known as: RENVELA TAKE 1 TABLET(800 MG) BY  MOUTH THREE TIMES DAILY What changed: See the new instructions.   THEREMS PO Take 1 tablet by mouth daily. multivitamin               Discharge Care Instructions  (From admission, onward)           Start     Ordered   07/31/21 0000  Discharge wound care:       Comments: Clean and dress daily   07/31/21 1104           Allergies  Allergen Reactions   Methotrexate Other (See Comments)    Blood count drops   Vancomycin Shortness Of Breath    Eyes watering, SOB, wheezing   Cefepime Other (See Comments)    Confusion    Tape       The results of significant diagnostics from this hospitalization (including imaging, microbiology, ancillary and laboratory) are listed below for reference.    Significant Diagnostic Studies: CT HEAD WO CONTRAST (5MM)  Result Date: 07/27/2021 CLINICAL DATA:  Mental status change, unknown cause EXAM: CT HEAD WITHOUT CONTRAST TECHNIQUE: Contiguous axial images were obtained from the base of the skull through the vertex without intravenous  contrast. COMPARISON:  06/09/2021 FINDINGS: Brain: There is no acute intracranial hemorrhage, mass effect, or edema. No new loss of gray-white differentiation. Encephalomalacia with areas of calcification again identified in the right frontal lobe and insula. Stable prominence of ventricles and sulci reflecting parenchymal volume loss. Stable patchy and confluent areas of low-attenuation in the cerebral white matter likely reflecting chronic microvascular ischemic changes. Vascular: There is atherosclerotic calcification at the skull base. Skull: Calvarium is unremarkable. Sinuses/Orbits: No acute finding. Other: Partially imaged suboccipital soft tissue density is also present on prior imaging. IMPRESSION: No acute intracranial abnormality. Stable chronic findings detailed above. Electronically Signed   By: Macy Mis M.D.   On: 07/27/2021 08:34   CT PELVIS WO CONTRAST  Result Date: 07/27/2021 CLINICAL DATA:  60 year old male purulent wound right buttock. EXAM: CT PELVIS WITHOUT CONTRAST TECHNIQUE: Multidetector CT imaging of the pelvis was performed following the standard protocol without intravenous contrast. COMPARISON:  CT Abdomen and Pelvis 02/06/2021 CT pelvis with contrast 12/06/2017. FINDINGS: Urinary Tract: Negative noncontrast visible right kidney. No hydroureter is evident. Diminutive bladder. Bowel: Retained stool throughout the visible large bowel. No dilated large or small bowel loops. Vascular/Lymphatic: Diffuse severe calcified atherosclerosis throughout the visible abdomen and pelvis. Vascular patency is not evaluated in the absence of IV contrast. No lymphadenopathy is evident. Reproductive: Soft tissue wound evident along the right gluteal fold on series 2, image 53. Soft tissue wound in this same region present in 2019, but now appears larger since April. Regional soft tissue thickening, granulation tissue which is inseparable from the anal verge as well as the distal rectum. There is  no tracking soft tissue gas. There is no fluid collection evident on this noncontrast exam. Other: Small volume ascites suspected about the liver (series 2, image 1). No definite pelvic free fluid. Paucity of visceral fat now in the abdomen and pelvis. Negative visible noncontrast liver and gallbladder. Musculoskeletal: Stable visualized osseous structures., including the sacrum and coccygeal segments since 2019. Chronic renal osteodystrophy suspected. IMPRESSION: 1. Chronic soft tissue wound of the right gluteal fold. This appears mildly larger since April and might directly involve the anus. But there is no tracking soft tissue, no fluid collection evident on this noncontrast exam, and no pelvic osteomyelitis. 2. Small volume ascites suspected about the visible liver. 3.  Chronic renal osteodystrophy suspected. And very severe calcified atherosclerosis throughout the visible abdomen and pelvis. Electronically Signed   By: Genevie Ann M.D.   On: 07/27/2021 08:44   DG Chest Portable 1 View  Result Date: 07/27/2021 CLINICAL DATA:  60 year old male with history of hypoglycemia. Dyspnea. Concern for aspiration. EXAM: PORTABLE CHEST 1 VIEW COMPARISON:  Chest x-ray 06/09/2021. FINDINGS: Vascular stents noted in the region of the left axillary vein and innominate vein. There is marked cephalization of the pulmonary vasculature, severe indistinctness of the interstitial markings, and extensive multifocal airspace disease throughout the lungs bilaterally (right greater than left) suggestive of severe pulmonary edema. Small right pleural effusion. No definite left pleural effusion. Heart size is moderately enlarged. Upper mediastinal contours are distorted by patient's rotation to the right. Atherosclerotic calcifications in the thoracic aorta. IMPRESSION: 1. The appearance the chest again suggests severe congestive heart failure, as above. 2. Given the slight asymmetry of the airspace disease, the possibility of  superimposed right-sided bronchopneumonia is not excluded, particularly in light of the asymmetric right-sided pleural effusion. 3. Aortic atherosclerosis. Electronically Signed   By: Vinnie Langton M.D.   On: 07/27/2021 07:36    Microbiology: Recent Results (from the past 240 hour(s))  Urine Culture     Status: None   Collection Time: 07/27/21  7:24 AM   Specimen: In/Out Cath Urine  Result Value Ref Range Status   Specimen Description   Final    IN/OUT CATH URINE Performed at [facility], [address] [place], [place] [address]    Special Requests   Final    NONE Performed at [facility], [address] [place], [place] [address]    Culture   Final    NO GROWTH Performed at [facility], [place] [address] [place], [place] [address]    Report Status 07/28/2021 FINAL  Final  Aerobic Culture w Gram Stain (superficial specimen)     Status: None   Collection Time: 07/27/21  7:48 AM   Specimen: Wound  Result Value Ref Range Status   Specimen Description   Final    WOUND Performed at [facility], [address] [place], [place] [address]    Special Requests   Final    NONE Performed at [facility] [place], [place] [address]    Gram Stain   Final    FEW WBC PRESENT,BOTH PMN AND MONONUCLEAR RARE GRAM POSITIVE COCCI IN PAIRS    Culture   Final    FEW CORYNEBACTERIUM STRIATUM Standardized susceptibility testing for this organism is not available. Performed at [facility], [place] [address] [place], [place] [address]    Report Status 07/30/2021 FINAL  Final  Resp Panel by RT-PCR (Flu A&B, Covid) Nasopharyngeal Swab     Status: Abnormal   Collection Time: 07/27/21  7:55 AM   Specimen: Nasopharyngeal Swab; Nasopharyngeal(NP) swabs in vial transport medium  Result Value Ref Range Status   SARS Coronavirus 2 by RT PCR POSITIVE (A) NEGATIVE Final    Comment: RESULT CALLED TO, READ BACK BY AND VERIFIED  WITH: Ricke Hey, RN 0900 07/27/21 GM (NOTE) SARS-CoV-2 target nucleic acids are DETECTED.  The SARS-CoV-2 RNA is generally detectable in upper respiratory specimens during the acute phase of infection. Positive results are indicative of the presence of the identified virus, but do not rule out bacterial infection or co-infection with other pathogens not detected by the test. Clinical correlation with patient history and  other diagnostic information is necessary to determine patient infection status. The expected result is Negative.  Fact Sheet for Patients: EntrepreneurPulse.com.au  Fact Sheet for Healthcare Providers: IncredibleEmployment.be  This test is not yet approved or cleared by the [place] FDA and  has been authorized for detection and/or diagnosis of SARS-CoV-2 by FDA under an Emergency Use Authorization (EUA).  This EUA will remain in effect (meaning this test can be  used) for the duration of  the COVID-19 declaration under Section 564(b)(1) of the Act, 21 U.S.C. section 360bbb-3(b)(1), unless the authorization is terminated or revoked sooner.     Influenza A by PCR NEGATIVE NEGATIVE Final   Influenza B by PCR NEGATIVE NEGATIVE Final    Comment: (NOTE) The Xpert Xpress SARS-CoV-2/FLU/RSV plus assay is intended as an aid in the diagnosis of influenza from Nasopharyngeal swab specimens and should not be used as a sole basis for treatment. Nasal washings and aspirates are unacceptable for Xpert Xpress SARS-CoV-2/FLU/RSV testing.  Fact Sheet for Patients: EntrepreneurPulse.com.au  Fact Sheet for Healthcare Providers: IncredibleEmployment.be  This test is not yet approved or cleared by the [place] FDA and has been authorized for detection and/or diagnosis of SARS-CoV-2 by FDA under an Emergency Use Authorization (EUA). This EUA will remain in effect (meaning this test can be used)  for the duration of the COVID-19 declaration under Section 564(b)(1) of the Act, 21 U.S.C. section 360bbb-3(b)(1), unless the authorization is terminated or revoked.  Performed at [facility], [address] [place], [place] [address]   Blood culture (routine single)     Status: None (Preliminary result)   Collection Time: 07/27/21  7:55 AM   Specimen: BLOOD  Result Value Ref Range Status   Specimen Description BLOOD RIGHT ARM  Final   Special Requests   Final    BOTTLES DRAWN AEROBIC AND ANAEROBIC Blood Culture results may not be optimal due to an excessive volume of blood received in culture bottles   Culture   Final    NO GROWTH 4 DAYS Performed at [facility], [address] [place], [place] [address]    Report Status PENDING  Incomplete     Labs: Basic Metabolic Panel: Recent Labs  Lab 07/27/21 0723 07/28/21 0417 07/30/21 0426 07/31/21 0535  NA 135 135 133* 136  K 4.4 4.3 4.0 4.6  CL 98 96* 96* 98  CO2 29 28 30 28   GLUCOSE 142* 252* 235* 198*  BUN 58* 43* 40* 51*  CREATININE 5.24* 3.37* 3.33* 4.48*  CALCIUM 9.1 8.7* 8.2* 8.9  MG 1.9  --   --   --    Liver Function Tests: Recent Labs  Lab 07/27/21 0723  AST 20  ALT 22  ALKPHOS 94  BILITOT 0.6  PROT 7.0  ALBUMIN 2.3*   Recent Labs  Lab 07/27/21 0723  LIPASE 31   No results for input(s): AMMONIA in the last 168 hours. CBC: Recent Labs  Lab 07/27/21 0723 07/28/21 0417 07/29/21 0502 07/29/21 2211 07/30/21 0426 07/31/21 0535  WBC 16.6* 9.7 8.8 9.2 9.4 11.6*  NEUTROABS 15.4*  --   --   --   --   --   HGB 6.0* 6.9* 6.2* 7.8* 7.1* 7.9*  HCT 20.0* 21.7* 20.3* 25.2* 23.5* 25.5*  MCV 98.0 95.2 96.7 94.4 93.6 92.7  PLT 224 193 171 164 170 166   Cardiac Enzymes: No results for input(s): CKTOTAL, CKMB, CKMBINDEX, TROPONINI in the last 168 hours. BNP: BNP (last 3 results) Recent  Labs    05/11/21 1628 05/12/21 1928 05/18/21 0143  BNP 3,779.1* >4,500.0* >4,500.0*     ProBNP (last 3 results) No results for input(s): PROBNP in the last 8760 hours.  CBG: Recent Labs  Lab 07/30/21 0719 07/30/21 1151 07/30/21 1639 07/30/21 2111 07/31/21 0827  GLUCAP 193* 213* 197* 246* 197*       Signed:  Desma Maxim MD.  [facility] 07/31/2021, 11:05 AM

## 2021-07-31 NOTE — TOC Progression Note (Addendum)
Transition of Care [facility] - Progression Note    Patient Details  Name: [name] MRN: [id number] Date of Birth: 06/05/1961  [facility] CM/SW Contact  Pete Pelt, RN Phone Number: 07/31/2021, 1:18 PM  Clinical Narrative:   patient was scheduled to be discharged today, but requires continued medical workup.  Patient made aware by care team, [facility] notified.    Addendum  Admissions at [facility] state they can accept patient on Sunday if necessary, but not Saturday.  Expected Discharge Plan: [place] (Patient is a resident of [facility]) Barriers to Discharge: Continued Medical Work up  Expected Discharge Plan and Services Expected Discharge Plan: [place] (Patient is a resident of [facility]) In-house Referral: NA Discharge Planning Services: CM Consult Post Acute Care Choice: Long Term Acute Care (LTAC) Living arrangements for the past 2 months: [place] Expected Discharge Date: 07/31/21                                     Social Determinants of Health (SDOH) Interventions    Readmission Risk Interventions Readmission Risk Prevention Plan 07/28/2021 02/10/2021 02/08/2021  Transportation Screening Complete Complete Complete  PCP or Specialist Appt within 5-7 Days - - Complete  Home Care Screening - - Complete  Medication Review (RN CM) - - Complete  HRI or Home Care Consult - Complete -  Palliative Care Screening - Complete -  Medication Review ([place]) Complete Complete -  PCP or Specialist appointment within 3-5 days of discharge Complete - -  [place] or [place] (No Data) - -  Palliative Care Screening Not Applicable - -  [place] Not Applicable - -  Some recent data might be hidden

## 2021-07-31 NOTE — Progress Notes (Signed)
[facility], [place] 07/31/21  Subjective:   Hospital day # 4  Patient was seen and evaluated during dialysis   HEMODIALYSIS FLOWSHEET:  Blood Flow Rate (mL/min): 400 mL/min Arterial Pressure (mmHg): -180 mmHg Venous Pressure (mmHg): 140 mmHg Transmembrane Pressure (mmHg): 50 mmHg Ultrafiltration Rate (mL/min): 420 mL/min Dialysate Flow Rate (mL/min): 500 ml/min Conductivity: Machine : 13.8 Conductivity: Machine : 13.8 Dialysis Fluid Bolus: Normal Saline Bolus Amount (mL): 250 mL  Continues to wipe nose for bloody residual from overnight nose bleed Has a emesis bag at his side with bloody sputum  Objective:  Vital signs in last 24 hours:  Temp:  [98.1 F (36.7 C)-99.2 F (37.3 C)] 99.2 F (37.3 C) (10/14 0820) Pulse Rate:  [68-86] 81 (10/14 1129) Resp:  [18-20] 18 (10/14 1115) BP: (154-209)/(66-97) 195/85 (10/14 1129) SpO2:  [89 %-100 %] 98 % (10/14 1129) Weight:  [64 kg] 64 kg (10/14 0837)  Weight change:  Filed Weights   07/29/21 0944 07/29/21 1300 07/31/21 0837  Weight: 64.3 kg 64 kg 64 kg    Intake/Output:    Intake/Output Summary (Last 24 hours) at 07/31/2021 1148 Last data filed at 07/31/2021 1130 Gross per 24 hour  Intake 240 ml  Output 500 ml  Net -260 ml    Physical Exam: General:  No acute distress, sitting up in bed  HEENT  anicteric, moist oral mucous membrane  Pulm/lungs  normal breathing effort, room air, clear  CVS/Heart  no rub or gallop  Abdomen:   Soft, nontender  Extremities:  No peripheral edema, b/l BKA  Neurologic:  Alert, oriented, able to follow commands  Skin:  No acute rashes, sacral wound  Left arm AVF  Basic Metabolic Panel:  Recent Labs  Lab 07/27/21 0723 07/28/21 0417 07/30/21 0426 07/31/21 0535  NA 135 135 133* 136  K 4.4 4.3 4.0 4.6  CL 98 96* 96* 98  CO2 29 28 30 28   GLUCOSE 142* 252* 235* 198*  BUN 58* 43* 40* 51*  CREATININE 5.24* 3.37* 3.33* 4.48*  CALCIUM 9.1 8.7* 8.2* 8.9   MG 1.9  --   --   --       CBC: Recent Labs  Lab 07/27/21 0723 07/28/21 0417 07/29/21 0502 07/29/21 2211 07/30/21 0426 07/31/21 0535  WBC 16.6* 9.7 8.8 9.2 9.4 11.6*  NEUTROABS 15.4*  --   --   --   --   --   HGB 6.0* 6.9* 6.2* 7.8* 7.1* 7.9*  HCT 20.0* 21.7* 20.3* 25.2* 23.5* 25.5*  MCV 98.0 95.2 96.7 94.4 93.6 92.7  PLT 224 193 171 164 170 166       Lab Results  Component Value Date   HEPBSAG NON REACTIVE 07/27/2021   HEPBSAB Reactive (A) 07/27/2021      Microbiology:  Recent Results (from the past 240 hour(s))  Urine Culture     Status: None   Collection Time: 07/27/21  7:24 AM   Specimen: In/Out Cath Urine  Result Value Ref Range Status   Specimen Description   Final    IN/OUT CATH URINE Performed at [facility], [address] [place], [place] [address]    Special Requests   Final    NONE Performed at [facility], [address] [place], [place] [address]    Culture   Final    NO GROWTH Performed at [facility], [address] [place], [place] [address]    Report Status 07/28/2021 FINAL  Final  Aerobic Culture w Gram Stain (superficial specimen)     Status: None   Collection Time: 07/27/21  7:48 AM   Specimen: Wound  Result Value Ref Range Status   Specimen Description   Final    WOUND Performed at [facility], [address], [place], [place] [address]    Special Requests   Final    NONE Performed at [facility], [place], [place] [address]    Gram Stain   Final    FEW WBC PRESENT,BOTH PMN AND MONONUCLEAR RARE GRAM POSITIVE COCCI IN PAIRS    Culture   Final    FEW CORYNEBACTERIUM STRIATUM Standardized susceptibility testing for this organism is not available. Performed at [facility], [place] [address] [place], [place] [address]    Report Status 07/30/2021 FINAL  Final  Resp Panel by RT-PCR (Flu A&B, Covid) Nasopharyngeal Swab     Status: Abnormal   Collection  Time: 07/27/21  7:55 AM   Specimen: Nasopharyngeal Swab; Nasopharyngeal(NP) swabs in vial transport medium  Result Value Ref Range Status   SARS Coronavirus 2 by RT PCR POSITIVE (A) NEGATIVE Final    Comment: RESULT CALLED TO, READ BACK BY AND VERIFIED WITH: Ricke Hey, RN 0900 07/27/21 GM (NOTE) SARS-CoV-2 target nucleic acids are DETECTED.  The SARS-CoV-2 RNA is generally detectable in upper respiratory specimens during the acute phase of infection. Positive results are indicative of the presence of the identified virus, but do not rule out bacterial infection or co-infection with other pathogens not detected by the test. Clinical correlation with patient history and other diagnostic information is necessary to determine patient infection status. The expected result is Negative.  Fact Sheet for Patients: EntrepreneurPulse.com.au  Fact Sheet for Healthcare Providers: IncredibleEmployment.be  This test is not yet approved or cleared by the [place] FDA and  has been authorized for detection and/or diagnosis of SARS-CoV-2 by FDA under an Emergency Use Authorization (EUA).  This EUA will remain in effect (meaning this test can be  used) for the duration of  the COVID-19 declaration under Section 564(b)(1) of the Act, 21 U.S.C. section 360bbb-3(b)(1), unless the authorization is terminated or revoked sooner.     Influenza A by PCR NEGATIVE NEGATIVE Final   Influenza B by PCR NEGATIVE NEGATIVE Final    Comment: (NOTE) The Xpert Xpress SARS-CoV-2/FLU/RSV plus assay is intended as an aid in the diagnosis of influenza from Nasopharyngeal swab specimens and should not be used as a sole basis for treatment. Nasal washings and aspirates are unacceptable for Xpert Xpress SARS-CoV-2/FLU/RSV testing.  Fact Sheet for Patients: EntrepreneurPulse.com.au  Fact Sheet for Healthcare  Providers: IncredibleEmployment.be  This test is not yet approved or cleared by the [place] FDA and has been authorized for detection and/or diagnosis of SARS-CoV-2 by FDA under an Emergency Use Authorization (EUA). This EUA will remain in effect (meaning this test can be used) for the duration of the COVID-19 declaration under Section 564(b)(1) of the Act, 21 U.S.C. section 360bbb-3(b)(1), unless the authorization is terminated or revoked.  Performed at [facility], [address] [place], [place] [address]   Blood culture (routine single)     Status: None (Preliminary result)   Collection Time: 07/27/21  7:55 AM   Specimen: BLOOD  Result Value Ref Range Status   Specimen Description BLOOD RIGHT ARM  Final   Special Requests   Final    BOTTLES DRAWN AEROBIC AND ANAEROBIC Blood Culture results may not  be optimal due to an excessive volume of blood received in culture bottles   Culture   Final    NO GROWTH 4 DAYS Performed at [facility], [address] [place], [place] [address]    Report Status PENDING  Incomplete    Coagulation Studies: No results for input(s): LABPROT, INR in the last 72 hours.   Urinalysis: No results for input(s): COLORURINE, LABSPEC, PHURINE, GLUCOSEU, HGBUR, BILIRUBINUR, KETONESUR, PROTEINUR, UROBILINOGEN, NITRITE, LEUKOCYTESUR in the last 72 hours.  Invalid input(s): APPERANCEUR     Imaging: No results found.   Medications:      amLODipine  5 mg Oral QPM   amoxicillin-clavulanate  1 tablet Oral BID   atorvastatin  80 mg Oral Daily   carvedilol  25 mg Oral BID   Chlorhexidine Gluconate Cloth  6 each Topical Q0600   epoetin (EPOGEN/PROCRIT) injection  4,000 Units Intravenous Q M,W,F-HD   feeding supplement  237 mL Oral TID BM   gabapentin  100 mg Oral QHS   hydrALAZINE  100 mg Oral Q8H   influenza vac split quadrivalent PF  0.5 mL Intramuscular Tomorrow-1000   insulin aspart  0-6 Units  Subcutaneous TID WC   levETIRAcetam  500 mg Oral Daily   lisinopril  40 mg Oral Daily   multivitamin  1 tablet Oral QHS   pantoprazole (PROTONIX) IV  40 mg Intravenous Q12H   pentafluoroprop-tetrafluoroeth       sevelamer carbonate  800 mg Oral TID WC   acetaminophen, albuterol, dextromethorphan-guaiFENesin, dextrose, hydrALAZINE, LORazepam, ondansetron (ZOFRAN) IV  Assessment/ Plan:  60 y.o. male with  with end stage renal disease on hemodialysis, diabetes mellitus type II, hypertension, CVA, peripheral vascular disease status post bilateral BKA    admitted on 07/27/2021 for Hypoglycemia [E16.2] Sepsis ([place]) [A41.9] Acute on chronic anemia [D64.9] Sepsis with encephalopathy without septic shock, due to unspecified organism ([place]) [A41.9, R65.20, G93.40]  # ESRD # Secondary hyperparathyroidism of renal origin N 25.81  # Anemia of CKD # Covid -19 positive # Aspiration Pneumonia # GI bleed  Receiving dialysis today, bedside due to Covid status. Tolerating well at this time. Outpatient clinic arranged at [facility] for Covid precautions. Instructed patient that his mask must remain in place during his entire visit to clinic, within reasonable exception. He verbalizes his understanding. If discharged, patient can start treatment on Monday.  Hgb increased to 7.9. Will continue EPO with dialysis treatments. Has received blood transfusions during this admission Calcium at goal, will continue sevelamer with meals three times a day Augmentin GI recommends monitoring of H/H at this time. EGD/Colonoscopy performed earlier this year with duodenitis, colon polyps and erythema of the rectum.    LOS: [place] 10/14/202211:48 Robinson [place], [place]

## 2021-07-31 NOTE — Progress Notes (Signed)
[name]  EYC:[id number] DOB: 1961-03-26 DOA: 07/27/2021 PCP: [name], MD    Brief Narrative:  [phone] with a history of ESRD on HD MWF, hidradenitis suppurativa on [name], HTN, HLD, DM2, CVA, ICH, DVT, B BKA, Crohn's disease, anemia of chronic kidney disease, seizure disorder, mild cognitive impairment, systolic congestive heart failure with EF 30-35%, and BPH who presented to the ER with altered mental status.  Of note patient reportedly had a positive COVID test 07/06/21 (?home test), but at that time did not have significant symptoms.  The day of his admission he was found to be confused and somnolent by his wife.  CBG was noted to be 37.  He was treated with glucagon and D50 by EMS and his CBG rapidly improved.  By the time he arrived in the ER he was waking up.  CT head was negative for acute process.  CXR suggested pulmonary edema and possible right infiltrate.  Exam in the ED noted a sacral ulcer.  Date of Positive COVID Test:  07/06/21 (?home test) and 07/27/21 (in ED)  Consultants:  None  Code Status: FULL CODE  Antimicrobials:  Zosyn 10/10 >10/11, Augmentin 10/11>  DVT prophylaxis: None due to severe anemia and bilateral BKA  Subjective: At the time of my visit the patient is alert and conversant.  Says feeling fine. Tolerated dialysis. No chest pain. No dyspnea or cough. Tolerating diet. One episode melanotic/maroon stool per nursing yesterday afternoon. None since. No abd pain or vomiting  Assessment & Plan:  Suspected aspiration pneumonia - sepsis Elevated WBC, hypothermia, and suspected bacterial pneumonia at time of presentation - continue empiric antibiotic for short course. Abx started 10/10. Continuing augmentin, plan to complete 7 day course (last day 10/16). Much improved.  GI bleeding? Anemia One episode maroon/melanotic stool 10/12 per nursing. Hgb 6.2 , baseline closer to 7. Was transfused 1 unit w/ appropriate rise, today to 7.9. had egd/colonoscopy  earlier this year - duodenitis, some colonic polyps, rectal erythema, otherwise unremarkable. Wife does say patient has very dark stool at baseline. Unclear if represents true gi bleed - asa and dvt ppx on hold - pantop iv switch to po - monitor h/h  Epistaxis Overnight and this morning did not respond to direct pressure. ENT has seen and packed. Advises maintain packing for 5 days and better bp control to prevent re-bleeding. Will need abx (keflex once above augmentin stops) until packing removed - maintain packing - bp as below  Acute metabolic encephalopathy  CT head unrevealing -likely simply due to hypoglycemia which then in turn led to aspiration. Appears to be back to baseline  DM2 uncontrolled with renal failure and severe hypoglycemia Unclear what initially led to the patient's hypoglycemia but this likely set off the events that ultimately led to AMS and resultant aspiration and the need for hospitalization - monitor CBG trend closely -avoid insulin for now. CGBs wnl last 24 hrs  HLD Continue Lipitor  HTN Uncontrolled despite dialysis earlier today - increase home amlod to 10, re-start home spiro which was held earlier this admission - add clonidine - cont hydral, lisinopril  ESRD on HD MWF Nephrology following, continuing mwf dialysis schedule  Seizure disorder Continue usual seizure medications  PVD status post bilateral BKA  Chronic systolic CHF -EF 02-63% Volume management per HD -no evidence of acute volume overload presently  Hidradentis Follow wound care recommendations. CT neg for osteo/abscess. On humira as outpt  Anemia of CKD Hgb stable after transfusion  Hypothermia  Felt to be related to severe hypoglycemia -currently normothermic  COVID infection Suspect this represents a persisting positive test despite lack of an active infectious infection    Family Communication: wife updated telephonically 10/14 Status is: Inpatient  Remains inpatient  appropriate because:Inpatient level of care appropriate due to severity of illness  Dispo: The patient is from: Home              Anticipated d/c is to: back to snf when bleeding stable and bps improved              Patient currently is not medically stable to d/c.   Difficult to place patient No    Objective: Blood pressure (!) 190/88, pulse 81, temperature 98.4 F (36.9 C), temperature source Oral, resp. rate 20, height 5' 11"  (1.803 m), weight 63.1 kg, SpO2 98 %.  Intake/Output Summary (Last 24 hours) at 07/31/2021 1451 Last data filed at 07/31/2021 1130 Gross per 24 hour  Intake 240 ml  Output 500 ml  Net -260 ml   Filed Weights   07/29/21 1300 07/31/21 0837 07/31/21 1206  Weight: 64 kg 64 kg 63.1 kg    Examination: General: No acute respiratory distress ENT: some dried blood in right nare. spitting up small amount of brb Lungs: Mild bibasilar crackles without wheezing Cardiovascular: Regular rate and rhythm without murmur  Abdomen: Nontender, nondistended, soft, bowel sounds positive, no rebound, no ascites, no appreciable mass Extremities: No significant cyanosis, clubbing, or edema of B LE remnants   CBC: Recent Labs  Lab 07/27/21 0723 07/28/21 0417 07/29/21 2211 07/30/21 0426 07/31/21 0535  WBC 16.6*   < > 9.2 9.4 11.6*  NEUTROABS 15.4*  --   --   --   --   HGB 6.0*   < > 7.8* 7.1* 7.9*  HCT 20.0*   < > 25.2* 23.5* 25.5*  MCV 98.0   < > 94.4 93.6 92.7  PLT 224   < > 164 170 166   < > = values in this interval not displayed.   Basic Metabolic Panel: Recent Labs  Lab 07/27/21 0723 07/28/21 0417 07/30/21 0426 07/31/21 0535  NA 135 135 133* 136  K 4.4 4.3 4.0 4.6  CL 98 96* 96* 98  CO2 29 28 30 28   GLUCOSE 142* 252* 235* 198*  BUN 58* 43* 40* 51*  CREATININE 5.24* 3.37* 3.33* 4.48*  CALCIUM 9.1 8.7* 8.2* 8.9  MG 1.9  --   --   --    GFR: Estimated Creatinine Clearance: 15.8 mL/min (A) (by C-G formula based on SCr of 4.48 mg/dL (H)).  Liver  Function Tests: Recent Labs  Lab 07/27/21 0723  AST 20  ALT 22  ALKPHOS 94  BILITOT 0.6  PROT 7.0  ALBUMIN 2.3*   Recent Labs  Lab 07/27/21 0723  LIPASE 31    Coagulation Profile: Recent Labs  Lab 07/27/21 0723  INR 1.2    HbA1C: Hemoglobin A1C  Date/Time Value Ref Range Status  07/24/2014 03:24 PM 8.3 (H) 4.2 - 6.3 % Final    Comment:    The American Diabetes Association recommends that a primary goal of therapy should be <7% and that physicians should reevaluate the treatment regimen in patients with HbA1c values consistently >8%.    Hgb A1c MFr Bld  Date/Time Value Ref Range Status  07/29/2021 05:02 AM 6.5 (H) 4.8 - 5.6 % Final    Comment:    (NOTE)  Prediabetes: 5.7 - 6.4         Diabetes: >6.4         Glycemic control for adults with diabetes: <7.0   05/18/2021 05:47 AM 6.0 (H) 4.8 - 5.6 % Final    Comment:    (NOTE) Pre diabetes:          5.7%-6.4%  Diabetes:              >6.4%  Glycemic control for   <7.0% adults with diabetes     CBG: Recent Labs  Lab 07/30/21 1151 07/30/21 1639 07/30/21 2111 07/31/21 0827 07/31/21 1225  GLUCAP 213* 197* 246* 197* 153*    Recent Results (from the past 240 hour(s))  Urine Culture     Status: None   Collection Time: 07/27/21  7:24 AM   Specimen: In/Out Cath Urine  Result Value Ref Range Status   Specimen Description   Final    IN/OUT CATH URINE Performed at [facility], [address] [place], [place] [address]    Special Requests   Final    NONE Performed at [facility], [address], [place], [place] [address]    Culture   Final    NO GROWTH Performed at [facility], [place] [address] [place], [place] [address]    Report Status 07/28/2021 FINAL  Final  Aerobic Culture w Gram Stain (superficial specimen)     Status: None   Collection Time: 07/27/21  7:48 AM   Specimen: Wound  Result Value Ref Range Status   Specimen Description   Final     WOUND Performed at [facility], [address], [place], [place] [address]    Special Requests   Final    NONE Performed at [facility], [place], [place] [address]    Gram Stain   Final    FEW WBC PRESENT,BOTH PMN AND MONONUCLEAR RARE GRAM POSITIVE COCCI IN PAIRS    Culture   Final    FEW CORYNEBACTERIUM STRIATUM Standardized susceptibility testing for this organism is not available. Performed at [facility], [place] [address] [place], [place] [address]    Report Status 07/30/2021 FINAL  Final  Resp Panel by RT-PCR (Flu A&B, Covid) Nasopharyngeal Swab     Status: Abnormal   Collection Time: 07/27/21  7:55 AM   Specimen: Nasopharyngeal Swab; Nasopharyngeal(NP) swabs in vial transport medium  Result Value Ref Range Status   SARS Coronavirus 2 by RT PCR POSITIVE (A) NEGATIVE Final    Comment: RESULT CALLED TO, READ BACK BY AND VERIFIED WITH: Ricke Hey, RN 0900 07/27/21 GM (NOTE) SARS-CoV-2 target nucleic acids are DETECTED.  The SARS-CoV-2 RNA is generally detectable in upper respiratory specimens during the acute phase of infection. Positive results are indicative of the presence of the identified virus, but do not rule out bacterial infection or co-infection with other pathogens not detected by the test. Clinical correlation with patient history and other diagnostic information is necessary to determine patient infection status. The expected result is Negative.  Fact Sheet for Patients: EntrepreneurPulse.com.au  Fact Sheet for Healthcare Providers: IncredibleEmployment.be  This test is not yet approved or cleared by the [place] FDA and  has been authorized for detection and/or diagnosis of SARS-CoV-2 by FDA under an Emergency Use Authorization (EUA).  This EUA will remain in effect (meaning this test can be  used) for the duration of  the COVID-19 declaration under Section 564(b)(1) of  the Act, 21 U.S.C. section 360bbb-3(b)(1), unless the authorization is terminated or revoked sooner.     Influenza A by PCR NEGATIVE NEGATIVE Final   Influenza B by PCR NEGATIVE NEGATIVE Final    Comment: (NOTE) The Xpert Xpress SARS-CoV-2/FLU/RSV plus assay is intended as an aid in the diagnosis of influenza from Nasopharyngeal swab specimens and should not be used as a sole basis for treatment. Nasal washings and aspirates are unacceptable for Xpert Xpress SARS-CoV-2/FLU/RSV testing.  Fact Sheet for Patients: EntrepreneurPulse.com.au  Fact Sheet for Healthcare Providers: IncredibleEmployment.be  This test is not yet approved or cleared by the [place] FDA and has been authorized for detection and/or diagnosis of SARS-CoV-2 by FDA under an Emergency Use Authorization (EUA). This EUA will remain in effect (meaning this test can be used) for the duration of the COVID-19 declaration under Section 564(b)(1) of the Act, 21 U.S.C. section 360bbb-3(b)(1), unless the authorization is terminated or revoked.  Performed at [facility], [address] [place], [place] [address]   Blood culture (routine single)     Status: None (Preliminary result)   Collection Time: 07/27/21  7:55 AM   Specimen: BLOOD  Result Value Ref Range Status   Specimen Description BLOOD RIGHT ARM  Final   Special Requests   Final    BOTTLES DRAWN AEROBIC AND ANAEROBIC Blood Culture results may not be optimal due to an excessive volume of blood received in culture bottles   Culture   Final    NO GROWTH 4 DAYS Performed at [facility], [address], [place], [place] [address]    Report Status PENDING  Incomplete     Scheduled Meds:  amLODipine  10 mg Oral QPM   amoxicillin-clavulanate  1 tablet Oral BID   atorvastatin  80 mg Oral Daily   carvedilol  25 mg Oral BID   Chlorhexidine Gluconate Cloth  6 each Topical Q0600   cloNIDine  0.1 mg Oral  BID   epoetin (EPOGEN/PROCRIT) injection  4,000 Units Intravenous Q M,W,F-HD   feeding supplement  237 mL Oral TID BM   gabapentin  100 mg Oral QHS   hydrALAZINE  100 mg Oral Q8H   influenza vac split quadrivalent PF  0.5 mL Intramuscular Tomorrow-1000   insulin aspart  0-6 Units Subcutaneous TID WC   levETIRAcetam  500 mg Oral Daily   lisinopril  40 mg Oral Daily   multivitamin  1 tablet Oral QHS   pantoprazole (PROTONIX) IV  40 mg Intravenous Q12H   sevelamer carbonate  800 mg Oral TID WC   spironolactone  25 mg Oral QHS   Continuous Infusions:     LOS: 4 days   Laurey Arrow, MD   If 7PM-7AM, please contact night-coverage per Amion 07/31/2021, 2:51 PM

## 2021-08-01 LAB — CBC
HCT: 22.4 % — ABNORMAL LOW (ref 39.0–52.0)
Hemoglobin: 6.8 g/dL — ABNORMAL LOW (ref 13.0–17.0)
MCH: 28.9 pg (ref 26.0–34.0)
MCHC: 30.4 g/dL (ref 30.0–36.0)
MCV: 95.3 fL (ref 80.0–100.0)
Platelets: 135 10*3/uL — ABNORMAL LOW (ref 150–400)
RBC: 2.35 MIL/uL — ABNORMAL LOW (ref 4.22–5.81)
RDW: 18.2 % — ABNORMAL HIGH (ref 11.5–15.5)
WBC: 9.8 10*3/uL (ref 4.0–10.5)
nRBC: 0 % (ref 0.0–0.2)

## 2021-08-01 LAB — GLUCOSE, CAPILLARY
Glucose-Capillary: 334 mg/dL — ABNORMAL HIGH (ref 70–99)
Glucose-Capillary: 216 mg/dL — ABNORMAL HIGH (ref 70–99)
Glucose-Capillary: 218 mg/dL — ABNORMAL HIGH (ref 70–99)
Glucose-Capillary: 269 mg/dL — ABNORMAL HIGH (ref 70–99)

## 2021-08-01 LAB — CULTURE, BLOOD (SINGLE): Culture: NO GROWTH

## 2021-08-01 LAB — PREPARE RBC (CROSSMATCH)

## 2021-08-01 LAB — HEMOGLOBIN: Hemoglobin: 6.9 g/dL — ABNORMAL LOW (ref 13.0–17.0)

## 2021-08-01 MED ORDER — INSULIN GLARGINE-YFGN 100 UNIT/ML ~~LOC~~ SOLN
5.0000 [IU] | Freq: Every day | SUBCUTANEOUS | Status: DC
  Administered 2021-08-01 – 2021-08-02 (×2): 5 [IU] via SUBCUTANEOUS
  Filled 2021-08-01 (×3): qty 0.05

## 2021-08-01 MED ORDER — OXYMETAZOLINE HCL 0.05 % NA SOLN
1.0000 | NASAL | Status: DC | PRN
  Administered 2021-08-01 – 2021-08-02 (×3): 1 via NASAL
  Filled 2021-08-01 (×2): qty 15

## 2021-08-01 MED ORDER — SODIUM CHLORIDE 0.9% IV SOLUTION: Freq: Once | INTRAVENOUS | Status: AC

## 2021-08-01 NOTE — Progress Notes (Addendum)
[name]  NTI:[id number] DOB: 08/14/1961 DOA: 07/27/2021 PCP: [name], MD    Brief Narrative:  [phone] with a history of ESRD on HD MWF, hidradenitis suppurativa on [name], HTN, HLD, DM2, CVA, ICH, DVT, B BKA, Crohn's disease, anemia of chronic kidney disease, seizure disorder, mild cognitive impairment, systolic congestive heart failure with EF 30-35%, and BPH who presented to the ER with altered mental status.  Of note patient reportedly had a positive COVID test 07/06/21 (?home test), but at that time did not have significant symptoms.  The day of his admission he was found to be confused and somnolent by his wife.  CBG was noted to be 37.  He was treated with glucagon and D50 by EMS and his CBG rapidly improved.  By the time he arrived in the ER he was waking up.  CT head was negative for acute process.  CXR suggested pulmonary edema and possible right infiltrate.  Exam in the ED noted a sacral ulcer.  Date of Positive COVID Test:  07/06/21 (?home test) and 07/27/21 (in ED)  Consultants:  None  Code Status: FULL CODE  Antimicrobials:  Zosyn 10/10 >10/11, Augmentin 10/11>  DVT prophylaxis: None due to severe anemia and bilateral BKA  Subjective: At the time of my visit the patient is alert and conversant.  Says feeling fine. Tolerated dialysis. No chest pain. No dyspnea or cough. Tolerating diet. One episode melanotic/maroon stool per nursing yesterday afternoon. None since. No abd pain or vomiting  Assessment & Plan:  Suspected aspiration pneumonia - sepsis Elevated WBC, hypothermia, and suspected bacterial pneumonia at time of presentation - continue empiric antibiotic for short course. Abx started 10/10. Continuing augmentin, plan to complete 7 day course (last day 10/16). Much improved.  GI bleeding? Anemia One episode maroon/melanotic stool 10/12 per nursing. Hgb 6.2 , baseline closer to 7. Was transfused 1 unit w/ appropriate rise, though today down to 6.9 in  setting of significant epistaxis. had egd/colonoscopy earlier this year - duodenitis, some colonic polyps, rectal erythema, otherwise unremarkable. Wife does say patient has very dark stool at baseline. Unclear if represents true gi bleed - asa and dvt ppx on hold - pantop iv switch to po - monitor h/h, will repeat hgb today if still below 7 will transfuse additional unit  Epistaxis Significant epistaxis starting early morning of 10/14. ENT has evaluated and packed right nare. Think uncontrolled htn playing a role.  Advises maintain packing for 5 days and better bp control to prevent re-bleeding. Will need abx (keflex once above augmentin stops) until packing removed - maintain packing - augmentin as above, will switch to keflex when augmentin stopped - afrin prn, is still having some seepage - bp as below  Acute metabolic encephalopathy  CT head unrevealing -likely simply due to hypoglycemia which then in turn led to aspiration. Appears to be back to baseline  DM2 uncontrolled with renal failure and severe hypoglycemia Unclear what initially led to the patient's hypoglycemia but this likely set off the events that ultimately led to AMS and resultant aspiration and the need for hospitalization - monitor CBG trend closely -avoid insulin for now. CGBs wnl last 24 hrs elevated - continue SSI - add lantus 5  HLD Continue Lipitor  HTN Uncontrolled despite dialysis 10/14. BP improved today 160s from 190s yesterday - increased home amlod to 10, re-started home spiro which was held earlier this admission - add edclonidine - cont hydral, lisinopril  ESRD on HD MWF Nephrology following,  continuing mwf dialysis schedule  Seizure disorder Continue usual seizure medications  PVD status post bilateral BKA  Chronic systolic CHF -EF 14-43% Volume management per HD -no evidence of acute volume overload presently  Hidradentis Follow wound care recommendations. CT neg for osteo/abscess. On  humira as outpt  Anemia of CKD Hgb stable after transfusion  Hypothermia Felt to be related to severe hypoglycemia -currently normothermic  COVID infection Suspect this represents a persisting positive test despite lack of an active infectious infection    Family Communication: wife updated telephonically 10/15 Status is: Inpatient  Remains inpatient appropriate because:Inpatient level of care appropriate due to severity of illness  Dispo: The patient is from: Home              Anticipated d/c is to: back to snf when bleeding stable and bps improved              Patient currently is not medically stable to d/c.   Difficult to place patient No    Objective: Blood pressure (!) 164/73, pulse 73, temperature 98.4 F (36.9 C), temperature source Oral, resp. rate 18, height 5' 11"  (1.803 m), weight 64.1 kg, SpO2 100 %. No intake or output data in the 24 hours ending 08/01/21 [place]   07/31/21 0837 07/31/21 1206 08/01/21 0423  Weight: 64 kg 63.1 kg 64.1 kg    Examination: General: No acute respiratory distress ENT: right nare packed, some blood oozing from it Lungs: Mild bibasilar crackles without wheezing Cardiovascular: Regular rate and rhythm without murmur  Abdomen: Nontender, nondistended, soft, bowel sounds positive, no rebound, no ascites, no appreciable mass Extremities: No significant cyanosis, clubbing, or edema of B LE remnants   CBC: Recent Labs  Lab 07/27/21 0723 07/28/21 0417 07/30/21 0426 07/31/21 0535 08/01/21 0537  WBC 16.6*   < > 9.4 11.6* 9.8  NEUTROABS 15.4*  --   --   --   --   HGB 6.0*   < > 7.1* 7.9* 6.8*  HCT 20.0*   < > 23.5* 25.5* 22.4*  MCV 98.0   < > 93.6 92.7 95.3  PLT 224   < > 170 166 135*   < > = values in this interval not displayed.   Basic Metabolic Panel: Recent Labs  Lab 07/27/21 0723 07/28/21 0417 07/30/21 0426 07/31/21 0535  NA 135 135 133* 136  K 4.4 4.3 4.0 4.6  CL 98 96* 96* 98  CO2 29 28 30 28    GLUCOSE 142* 252* 235* 198*  BUN 58* 43* 40* 51*  CREATININE 5.24* 3.37* 3.33* 4.48*  CALCIUM 9.1 8.7* 8.2* 8.9  MG 1.9  --   --   --    GFR: Estimated Creatinine Clearance: 16.1 mL/min (A) (by C-G formula based on SCr of 4.48 mg/dL (H)).  Liver Function Tests: Recent Labs  Lab 07/27/21 0723  AST 20  ALT 22  ALKPHOS 94  BILITOT 0.6  PROT 7.0  ALBUMIN 2.3*   Recent Labs  Lab 07/27/21 0723  LIPASE 31    Coagulation Profile: Recent Labs  Lab 07/27/21 0723  INR 1.2    HbA1C: Hemoglobin A1C  Date/Time Value Ref Range Status  07/24/2014 03:24 PM 8.3 (H) 4.2 - 6.3 % Final    Comment:    The American Diabetes Association recommends that a primary goal of therapy should be <7% and that physicians should reevaluate the treatment regimen in patients with HbA1c values consistently >8%.    Hgb A1c MFr  Bld  Date/Time Value Ref Range Status  07/29/2021 05:02 AM 6.5 (H) 4.8 - 5.6 % Final    Comment:    (NOTE)         Prediabetes: 5.7 - 6.4         Diabetes: >6.4         Glycemic control for adults with diabetes: <7.0   05/18/2021 05:47 AM 6.0 (H) 4.8 - 5.6 % Final    Comment:    (NOTE) Pre diabetes:          5.7%-6.4%  Diabetes:              >6.4%  Glycemic control for   <7.0% adults with diabetes     CBG: Recent Labs  Lab 07/31/21 0827 07/31/21 1225 07/31/21 1555 07/31/21 2109 08/01/21 0729  GLUCAP 197* 153* 231* 204* 334*    Recent Results (from the past 240 hour(s))  Urine Culture     Status: None   Collection Time: 07/27/21  7:24 AM   Specimen: In/Out Cath Urine  Result Value Ref Range Status   Specimen Description   Final    IN/OUT CATH URINE Performed at [facility], [address], [place], [place] [address]    Special Requests   Final    NONE Performed at [facility], [address] [place], [place] [address]    Culture   Final    NO GROWTH Performed at [facility], [place] [address], [place], [place]  [address]    Report Status 07/28/2021 FINAL  Final  Aerobic Culture w Gram Stain (superficial specimen)     Status: None   Collection Time: 07/27/21  7:48 AM   Specimen: Wound  Result Value Ref Range Status   Specimen Description   Final    WOUND Performed at [facility], [address] [place], [place] [address]    Special Requests   Final    NONE Performed at [facility], [name], [place] [address]    Gram Stain   Final    FEW WBC PRESENT,BOTH PMN AND MONONUCLEAR RARE GRAM POSITIVE COCCI IN PAIRS    Culture   Final    FEW CORYNEBACTERIUM STRIATUM Standardized susceptibility testing for this organism is not available. Performed at [facility], [place] [address], [place], [place] [address]    Report Status 07/30/2021 FINAL  Final  Resp Panel by RT-PCR (Flu A&B, Covid) Nasopharyngeal Swab     Status: Abnormal   Collection Time: 07/27/21  7:55 AM   Specimen: Nasopharyngeal Swab; Nasopharyngeal(NP) swabs in vial transport medium  Result Value Ref Range Status   SARS Coronavirus 2 by RT PCR POSITIVE (A) NEGATIVE Final    Comment: RESULT CALLED TO, READ BACK BY AND VERIFIED WITH: Ricke Hey, RN 0900 07/27/21 GM (NOTE) SARS-CoV-2 target nucleic acids are DETECTED.  The SARS-CoV-2 RNA is generally detectable in upper respiratory specimens during the acute phase of infection. Positive results are indicative of the presence of the identified virus, but do not rule out bacterial infection or co-infection with other pathogens not detected by the test. Clinical correlation with patient history and other diagnostic information is necessary to determine patient infection status. The expected result is Negative.  Fact Sheet for Patients: EntrepreneurPulse.com.au  Fact Sheet for Healthcare Providers: IncredibleEmployment.be  This test is not yet approved or cleared by the [place] FDA and  has been  authorized for detection and/or diagnosis of SARS-CoV-2  by FDA under an Emergency Use Authorization (EUA).  This EUA will remain in effect (meaning this test can be  used) for the duration of  the COVID-19 declaration under Section 564(b)(1) of the Act, 21 U.S.C. section 360bbb-3(b)(1), unless the authorization is terminated or revoked sooner.     Influenza A by PCR NEGATIVE NEGATIVE Final   Influenza B by PCR NEGATIVE NEGATIVE Final    Comment: (NOTE) The Xpert Xpress SARS-CoV-2/FLU/RSV plus assay is intended as an aid in the diagnosis of influenza from Nasopharyngeal swab specimens and should not be used as a sole basis for treatment. Nasal washings and aspirates are unacceptable for Xpert Xpress SARS-CoV-2/FLU/RSV testing.  Fact Sheet for Patients: EntrepreneurPulse.com.au  Fact Sheet for Healthcare Providers: IncredibleEmployment.be  This test is not yet approved or cleared by the [place] FDA and has been authorized for detection and/or diagnosis of SARS-CoV-2 by FDA under an Emergency Use Authorization (EUA). This EUA will remain in effect (meaning this test can be used) for the duration of the COVID-19 declaration under Section 564(b)(1) of the Act, 21 U.S.C. section 360bbb-3(b)(1), unless the authorization is terminated or revoked.  Performed at [facility], [address] [place], [place] [address]   Blood culture (routine single)     Status: None   Collection Time: 07/27/21  7:55 AM   Specimen: BLOOD  Result Value Ref Range Status   Specimen Description BLOOD RIGHT ARM  Final   Special Requests   Final    BOTTLES DRAWN AEROBIC AND ANAEROBIC Blood Culture results may not be optimal due to an excessive volume of blood received in culture bottles   Culture   Final    NO GROWTH 5 DAYS Performed at [facility], [address] [place], [place] [address]    Report Status 08/01/2021 FINAL  Final      Scheduled Meds:  amLODipine  10 mg Oral QPM   atorvastatin  80 mg Oral Daily   carvedilol  25 mg Oral BID   Chlorhexidine Gluconate Cloth  6 each Topical Q0600   cloNIDine  0.1 mg Oral BID   epoetin (EPOGEN/PROCRIT) injection  4,000 Units Intravenous Q M,W,F-HD   feeding supplement  237 mL Oral TID BM   gabapentin  100 mg Oral QHS   hydrALAZINE  100 mg Oral Q8H   influenza vac split quadrivalent PF  0.5 mL Intramuscular Tomorrow-1000   insulin aspart  0-6 Units Subcutaneous TID WC   insulin glargine-yfgn  5 Units Subcutaneous Daily   levETIRAcetam  500 mg Oral Daily   lisinopril  40 mg Oral Daily   multivitamin  1 tablet Oral QHS   pantoprazole  40 mg Oral Daily   sevelamer carbonate  800 mg Oral TID WC   spironolactone  25 mg Oral QHS   Continuous Infusions:     LOS: 5 days   Laurey Arrow, MD   If 7PM-7AM, please contact night-coverage per Amion 08/01/2021, 11:30 AM

## 2021-08-01 NOTE — Plan of Care (Signed)
Patient slept most of the shift.  Fair appetite. Afrin given twice this afternoon for epistaxis, very small amount of blood.  No BM today.  Hgb 6.9.  Unit of pRBC's initiated.

## 2021-08-01 NOTE — Progress Notes (Signed)
[facility], [place] 08/01/21  Subjective:   Hospital day # 5  Patient seen resting in bed Continues to complain of weakness Tolerating small meals  Objective:  Vital signs in last 24 hours:  Temp:  [98.2 F (36.8 C)-98.6 F (37 C)] 98.4 F (36.9 C) (10/15 1150) Pulse Rate:  [68-79] 68 (10/15 1150) Resp:  [18-20] 18 (10/15 1150) BP: (155-188)/(67-77) 158/67 (10/15 1150) SpO2:  [96 %-100 %] 96 % (10/15 1150) Weight:  [64.1 kg] 64.1 kg (10/15 0423)  Weight change:  Filed Weights   07/31/21 0837 07/31/21 1206 08/01/21 0423  Weight: 64 kg 63.1 kg 64.1 kg    Intake/Output:   No intake or output data in the 24 hours ending 08/01/21 1209  Physical Exam: General:  No acute distress, sitting up in bed  HEENT  anicteric, moist oral mucous membrane  Pulm/lungs  normal breathing effort, room air, clear  CVS/Heart  no rub or gallop  Abdomen:   Soft, nontender  Extremities:  No peripheral edema, b/l BKA  Neurologic:  Alert, oriented, able to follow commands  Skin:  No acute rashes, sacral wound  Left arm AVF  Basic Metabolic Panel:  Recent Labs  Lab 07/27/21 0723 07/28/21 0417 07/30/21 0426 07/31/21 0535  NA 135 135 133* 136  K 4.4 4.3 4.0 4.6  CL 98 96* 96* 98  CO2 29 28 30 28   GLUCOSE 142* 252* 235* 198*  BUN 58* 43* 40* 51*  CREATININE 5.24* 3.37* 3.33* 4.48*  CALCIUM 9.1 8.7* 8.2* 8.9  MG 1.9  --   --   --       CBC: Recent Labs  Lab 07/27/21 0723 07/28/21 0417 07/29/21 0502 07/29/21 2211 07/30/21 0426 07/31/21 0535 08/01/21 0537  WBC 16.6*   < > 8.8 9.2 9.4 11.6* 9.8  NEUTROABS 15.4*  --   --   --   --   --   --   HGB 6.0*   < > 6.2* 7.8* 7.1* 7.9* 6.8*  HCT 20.0*   < > 20.3* 25.2* 23.5* 25.5* 22.4*  MCV 98.0   < > 96.7 94.4 93.6 92.7 95.3  PLT 224   < > 171 164 170 166 135*   < > = values in this interval not displayed.       Lab Results  Component Value Date   HEPBSAG NON REACTIVE 07/27/2021   HEPBSAB  Reactive (A) 07/27/2021      Microbiology:  Recent Results (from the past 240 hour(s))  Urine Culture     Status: None   Collection Time: 07/27/21  7:24 AM   Specimen: In/Out Cath Urine  Result Value Ref Range Status   Specimen Description   Final    IN/OUT CATH URINE Performed at [facility], [address] [place], [place] [address]    Special Requests   Final    NONE Performed at [facility], [address], [place], [place] [address]    Culture   Final    NO GROWTH Performed at [facility], [place] [address] [place], [place] [address]    Report Status 07/28/2021 FINAL  Final  Aerobic Culture w Gram Stain (superficial specimen)     Status: None   Collection Time: 07/27/21  7:48 AM   Specimen: Wound  Result Value Ref Range Status   Specimen Description   Final    WOUND Performed at [facility], [address] [place], [place] [address]  Special Requests   Final    NONE Performed at [facility], [place], [place] [address]    Gram Stain   Final    FEW WBC PRESENT,BOTH PMN AND MONONUCLEAR RARE GRAM POSITIVE COCCI IN PAIRS    Culture   Final    FEW CORYNEBACTERIUM STRIATUM Standardized susceptibility testing for this organism is not available. Performed at [facility], [place] [address], [place], [place] [address]    Report Status 07/30/2021 FINAL  Final  Resp Panel by RT-PCR (Flu A&B, Covid) Nasopharyngeal Swab     Status: Abnormal   Collection Time: 07/27/21  7:55 AM   Specimen: Nasopharyngeal Swab; Nasopharyngeal(NP) swabs in vial transport medium  Result Value Ref Range Status   SARS Coronavirus 2 by RT PCR POSITIVE (A) NEGATIVE Final    Comment: RESULT CALLED TO, READ BACK BY AND VERIFIED WITH: Ricke Hey, RN 0900 07/27/21 GM (NOTE) SARS-CoV-2 target nucleic acids are DETECTED.  The SARS-CoV-2 RNA is generally detectable in upper respiratory specimens during the acute phase of  infection. Positive results are indicative of the presence of the identified virus, but do not rule out bacterial infection or co-infection with other pathogens not detected by the test. Clinical correlation with patient history and other diagnostic information is necessary to determine patient infection status. The expected result is Negative.  Fact Sheet for Patients: EntrepreneurPulse.com.au  Fact Sheet for Healthcare Providers: IncredibleEmployment.be  This test is not yet approved or cleared by the [place] FDA and  has been authorized for detection and/or diagnosis of SARS-CoV-2 by FDA under an Emergency Use Authorization (EUA).  This EUA will remain in effect (meaning this test can be  used) for the duration of  the COVID-19 declaration under Section 564(b)(1) of the Act, 21 U.S.C. section 360bbb-3(b)(1), unless the authorization is terminated or revoked sooner.     Influenza A by PCR NEGATIVE NEGATIVE Final   Influenza B by PCR NEGATIVE NEGATIVE Final    Comment: (NOTE) The Xpert Xpress SARS-CoV-2/FLU/RSV plus assay is intended as an aid in the diagnosis of influenza from Nasopharyngeal swab specimens and should not be used as a sole basis for treatment. Nasal washings and aspirates are unacceptable for Xpert Xpress SARS-CoV-2/FLU/RSV testing.  Fact Sheet for Patients: EntrepreneurPulse.com.au  Fact Sheet for Healthcare Providers: IncredibleEmployment.be  This test is not yet approved or cleared by the [place] FDA and has been authorized for detection and/or diagnosis of SARS-CoV-2 by FDA under an Emergency Use Authorization (EUA). This EUA will remain in effect (meaning this test can be used) for the duration of the COVID-19 declaration under Section 564(b)(1) of the Act, 21 U.S.C. section 360bbb-3(b)(1), unless the authorization is terminated or revoked.  Performed at [facility], [address] [place], [place] [address]   Blood culture (routine single)     Status: None   Collection Time: 07/27/21  7:55 AM   Specimen: BLOOD  Result Value Ref Range Status   Specimen Description BLOOD RIGHT ARM  Final   Special Requests   Final    BOTTLES DRAWN AEROBIC AND ANAEROBIC Blood Culture results may not be optimal due to an excessive volume of blood received in culture bottles   Culture   Final    NO GROWTH 5 DAYS Performed at [facility], [address] [place], [place] [address]    Report Status 08/01/2021 FINAL  Final    Coagulation Studies: No results for input(s): LABPROT, INR in  the last 72 hours.   Urinalysis: No results for input(s): COLORURINE, LABSPEC, PHURINE, GLUCOSEU, HGBUR, BILIRUBINUR, KETONESUR, PROTEINUR, UROBILINOGEN, NITRITE, LEUKOCYTESUR in the last 72 hours.  Invalid input(s): APPERANCEUR     Imaging: DG Chest Port 1 View  Result Date: 07/31/2021 CLINICAL DATA:  Hemoptysis. History of diabetes, Crohn's disease and stroke. EXAM: PORTABLE CHEST 1 VIEW COMPARISON:  Radiographs 07/27/2021 and 06/09/2021.  CT 06/09/2021. FINDINGS: 1300 hours. Two views obtained. Stable cardiomegaly and mediastinal contours. There is aortic atherosclerosis and vascular stents in the left brachiocephalic and axillary veins. There are persistent diffuse bilateral airspace opacities with interval improvement on the right. There is a small right pleural effusion. No pneumothorax. The bones appear unchanged. IMPRESSION: Interval partial clearing of diffuse airspace opacities on the right, with remaining opacities nearly symmetric, likely edema or atypical infection. Small right pleural effusion. Electronically Signed   By: Richardean Sale M.D.   On: 07/31/2021 13:36     Medications:      amLODipine  10 mg Oral QPM   atorvastatin  80 mg Oral Daily   carvedilol  25 mg Oral BID   Chlorhexidine Gluconate Cloth  6 each Topical Q0600    cloNIDine  0.1 mg Oral BID   epoetin (EPOGEN/PROCRIT) injection  4,000 Units Intravenous Q M,W,F-HD   feeding supplement  237 mL Oral TID BM   gabapentin  100 mg Oral QHS   hydrALAZINE  100 mg Oral Q8H   influenza vac split quadrivalent PF  0.5 mL Intramuscular Tomorrow-1000   insulin aspart  0-6 Units Subcutaneous TID WC   insulin glargine-yfgn  5 Units Subcutaneous Daily   levETIRAcetam  500 mg Oral Daily   lisinopril  40 mg Oral Daily   multivitamin  1 tablet Oral QHS   pantoprazole  40 mg Oral Daily   sevelamer carbonate  800 mg Oral TID WC   spironolactone  25 mg Oral QHS   acetaminophen, albuterol, dextromethorphan-guaiFENesin, dextrose, hydrALAZINE, LORazepam, ondansetron (ZOFRAN) IV, oxymetazoline  Assessment/ Plan:  60 y.o. male with  with end stage renal disease on hemodialysis, diabetes mellitus type II, hypertension, CVA, peripheral vascular disease status post bilateral BKA    admitted on 07/27/2021 for Hypoglycemia [E16.2] Sepsis ([place]) [A41.9] Acute on chronic anemia [D64.9] Sepsis with encephalopathy without septic shock, due to unspecified organism ([place]) [A41.9, R65.20, G93.40]  # ESRD # Secondary hyperparathyroidism of renal origin N 25.81  # Anemia of CKD # Covid -19 positive # Aspiration Pneumonia # Epistaxis secondary to hypertension  Dialysis completed yesterday, tolerated well. Next treatment scheduled for Monday, if remains inpatient. Appreciate dialysis coordinator arranging outpatient treatment at [facility] for use of isolation room due to positive Covid status Hgb decreased 6.8. Will continue EPO with dialysis treatments. Has received blood transfusions during this admission. Nose bleed and hemoptysis yesterday.  Calcium at goal, will continue sevelamer with meals three times a day Continue Augmentin GI recommends monitoring of H/H at this time. EGD/Colonoscopy performed earlier this year with duodenitis, colon polyps and erythema of the rectum.  ENT consulted   LOS: [place] 10/15/202212:09 [place], [place]

## 2021-08-02 LAB — CBC
HCT: 25.4 % — ABNORMAL LOW (ref 39.0–52.0)
Hemoglobin: 7.8 g/dL — ABNORMAL LOW (ref 13.0–17.0)
MCH: 28.6 pg (ref 26.0–34.0)
MCHC: 30.7 g/dL (ref 30.0–36.0)
MCV: 93 fL (ref 80.0–100.0)
Platelets: 168 10*3/uL (ref 150–400)
RBC: 2.73 MIL/uL — ABNORMAL LOW (ref 4.22–5.81)
RDW: 18.4 % — ABNORMAL HIGH (ref 11.5–15.5)
WBC: 9.3 10*3/uL (ref 4.0–10.5)
nRBC: 0 % (ref 0.0–0.2)

## 2021-08-02 LAB — BASIC METABOLIC PANEL
Anion gap: 8 (ref 5–15)
BUN: 49 mg/dL — ABNORMAL HIGH (ref 6–20)
CO2: 31 mmol/L (ref 22–32)
Calcium: 9.1 mg/dL (ref 8.9–10.3)
Chloride: 97 mmol/L — ABNORMAL LOW (ref 98–111)
Creatinine, Ser: 4.4 mg/dL — ABNORMAL HIGH (ref 0.61–1.24)
GFR, Estimated: 15 mL/min — ABNORMAL LOW (ref >60)
Glucose, Bld: 144 mg/dL — ABNORMAL HIGH (ref 70–99)
Potassium: 4.6 mmol/L (ref 3.5–5.1)
Sodium: 136 mmol/L (ref 135–145)

## 2021-08-02 LAB — GLUCOSE, CAPILLARY
Glucose-Capillary: 141 mg/dL — ABNORMAL HIGH (ref 70–99)
Glucose-Capillary: 215 mg/dL — ABNORMAL HIGH (ref 70–99)

## 2021-08-02 MED ORDER — INSULIN GLARGINE-YFGN 100 UNIT/ML ~~LOC~~ SOLN: 5.0000 [IU] | Freq: Every day | SUBCUTANEOUS | 11 refills | Status: AC

## 2021-08-02 MED ORDER — GABAPENTIN 100 MG PO CAPS: 100.0000 mg | ORAL_CAPSULE | Freq: Every day | ORAL | Status: AC

## 2021-08-02 MED ORDER — AMLODIPINE BESYLATE 10 MG PO TABS: 10.0000 mg | ORAL_TABLET | Freq: Every evening | ORAL | Status: AC

## 2021-08-02 MED ORDER — AMOXICILLIN-POT CLAVULANATE 500-125 MG PO TABS: 1.0000 | ORAL_TABLET | Freq: Two times a day (BID) | ORAL | 0 refills | Status: DC

## 2021-08-02 MED ORDER — OXYMETAZOLINE HCL 0.05 % NA SOLN: 1.0000 | NASAL | 0 refills | Status: AC | PRN

## 2021-08-02 MED ORDER — CLONIDINE HCL 0.2 MG PO TABS: 0.1000 mg | ORAL_TABLET | Freq: Two times a day (BID) | ORAL | Status: AC

## 2021-08-02 NOTE — Discharge Summary (Signed)
[name] UEA:[id number] DOB: 09-09-1961 DOA: 07/27/2021  PCP: [name], MD  Admit date: 07/27/2021 Discharge date: 08/02/2021  Time spent: 45 minutes  Recommendations for Outpatient Follow-up:  Will need to f/u with ENT (Dr. Clyde Canterbury) on 10/19 for removal of gauze packing. Call his office to arrange that appointment Will need check of hemoglobin in 3-7 days    Discharge Diagnoses:  Principal Problem:   Aspiration pneumonia ([place]) Active Problems:   Hypercholesteremia   Hypertension   End stage renal failure on dialysis [facility])   Seizure disorder (HCC)   S/P bilateral BKA (below knee amputation) ([place])   Sepsis ([place])   Acute metabolic encephalopathy   Chronic systolic CHF (congestive heart failure) ([place])   Unstageable pressure ulcer of sacral region ([place])   Type II diabetes mellitus with renal manifestations ([place])   Anemia in ESRD (end-stage renal disease) ([place])   Hypoglycemia   Hypothermia   COVID-19 virus infection   Discharge Condition: stable  Diet recommendation: low sodium  Filed Weights   07/31/21 1206 08/01/21 0423 08/02/21 0437  Weight: 63.1 kg 64.1 kg 65.9 kg    History of present illness:  60yo with a history of ESRD on HD MWF, hidradenitis suppurativa on [name], HTN, HLD, DM2, CVA, ICH, DVT, B BKA, Crohn's disease, anemia of chronic kidney disease, seizure disorder, mild cognitive impairment, systolic congestive heart failure with EF 30-35%, and BPH who presented to the ER with altered mental status.  Of note patient reportedly had a positive COVID test 07/06/21 (?home test), but at that time did not have significant symptoms.  The day of his admission he was found to be confused and somnolent by his wife.  CBG was noted to be 37.  He was treated with glucagon and D50 by EMS and his CBG rapidly improved.  By the time he arrived in the ER he was waking up.  CT head was negative for acute process.  CXR suggested pulmonary edema and possible right  infiltrate.  Exam in the ED noted a sacral ulcer.  Hospital Course:  Suspected aspiration pneumonia - sepsis Elevated WBC, hypothermia, and suspected bacterial pneumonia at time of presentation. Treated with 7 day course augmentin (which will continue for nasal packing as below). Symptoms resolved.    GI bleeding? Anemia One episode maroon/melanotic stool 10/12 per nursing. Hgb 6.2 , baseline closer to 7. Was transfused 1 unit w/ appropriate rise, though trended down to 6.9 on 10/15 in setting of significant epistaxis. had egd/colonoscopy earlier this year - duodenitis, some colonic polyps, rectal erythema, otherwise unremarkable. Wife does say patient has very dark stool at baseline. Unclear if represents true gi bleed - asa and dvt ppx on hold - pantop iv switch to po - monitor h/h as outpatient   Epistaxis Significant epistaxis starting early morning of 10/14. ENT has evaluated and packed right nare. Think uncontrolled htn playing a role.  Advises maintain packing for 5 days and better bp control to prevent re-bleeding. Will need abx until packing removed - maintain packing, needs ENT f/u on 10/19 - cont augmentin until packing removed - afrin prn, to control any bloody seepage from packing - bp as below   Acute metabolic encephalopathy  CT head unrevealing -likely simply due to hypoglycemia which then in turn led to aspiration. Appears to be back to baseline though is intermittently somnolent   DM2 uncontrolled with renal failure and severe hypoglycemia Unclear what initially led to the patient's hypoglycemia but this likely set  off the events that ultimately led to AMS and resultant aspiration and the need for hospitalization - monitor CBG trend closely -avoid insulin for now. CGBs wnl last 24 hrs elevated - continue SSI - added lantus 5   HLD Continue Lipitor   HTN Uncontrolled despite dialysis 10/14. BP improved with changes to home regimen. - increased home amlod to 10,  re-started home spiro which was held earlier this admission - add clonidine - cont hydral, lisinopril   ESRD on HD MWF Nephrology following, continuing mwf dialysis schedule. Next due 10/17   Seizure disorder Continue usual seizure medications   PVD status post bilateral BKA   Chronic systolic CHF -EF 35-70% Volume management per HD -no evidence of acute volume overload presently   Hidradentis Follow wound care recommendations. CT neg for osteo/abscess. On humira as outpt   Anemia of CKD Hgb stable after transfusion. Received total of 2 units here   Hypothermia Felt to be related to severe hypoglycemia -currently normothermic   COVID infection Suspect this represents a persisting positive test despite lack of an active infectious infection     Procedures: none   Consultations: nephrology  Discharge Exam: Vitals:   08/02/21 0436 08/02/21 0808  BP: (!) 168/68 (!) 166/82  Pulse: 64 68  Resp: 16 20  Temp: 98 F (36.7 C) 98 F (36.7 C)  SpO2: 97% 98%    General: No acute respiratory distress ENT: right nare packed, some blood oozing from it Lungs: Mild bibasilar crackles without wheezing Cardiovascular: Regular rate and rhythm without murmur  Abdomen: Nontender, nondistended, soft, bowel sounds positive, no rebound, no ascites, no appreciable mass Extremities: No significant cyanosis, clubbing, or edema of B LE remnants   Discharge Instructions   Discharge Instructions     Diet - low sodium heart healthy   Complete by: As directed    Diet - low sodium heart healthy   Complete by: As directed    Discharge wound care:   Complete by: As directed    Clean and dress daily   Discharge wound care:   Complete by: As directed    Wash buttocks draining "holes" and scrotal draining "hole" with soap and water, pat dry. Place a size appropriate piece of Aquacel Kellie Simmering [phone]) over the areas, secure with fishnet underwear if possible. Change daily and prn soilage.    Increase activity slowly   Complete by: As directed    Increase activity slowly   Complete by: As directed       Allergies as of 08/02/2021       Reactions   Methotrexate Other (See Comments)   Blood count drops   Vancomycin Shortness Of Breath   Eyes watering, SOB, wheezing   Cefepime Other (See Comments)   Confusion    Tape         Medication List     STOP taking these medications    aspirin 81 MG chewable tablet   isosorbide mononitrate 30 MG 24 hr tablet Commonly known as: IMDUR       TAKE these medications    acetaminophen 500 MG tablet Commonly known as: TYLENOL Take 1,000 mg by mouth daily as needed for moderate pain or headache.   Alcohol Swabs Pads Use as directed to check blood sugar three times daily for insulin dependent type 2 diabetes.   amLODipine 10 MG tablet Commonly known as: NORVASC Take 0.5 tablets (5 mg total) by mouth every evening.   amLODipine 10 MG tablet Commonly known as:  NORVASC Take 1 tablet (10 mg total) by mouth every evening.   amoxicillin-clavulanate 500-125 MG tablet Commonly known as: Augmentin Take 1 tablet (500 mg total) by mouth 2 (two) times daily.   atorvastatin 80 MG tablet Commonly known as: LIPITOR TAKE 1 TABLET(80 MG) BY MOUTH DAILY What changed: See the new instructions.   carvedilol 25 MG tablet Commonly known as: COREG Take 1 tablet (25 mg total) by mouth 2 (two) times daily.   cloNIDine 0.2 MG tablet Commonly known as: CATAPRES Take 0.5 tablets (0.1 mg total) by mouth 2 (two) times daily.   feeding supplement Liqd Take 237 mLs by mouth 3 (three) times daily between meals.   gabapentin 100 MG capsule Commonly known as: NEURONTIN Take 1 capsule (100 mg total) by mouth at bedtime. What changed:  medication strength how much to take   [name] Pen 40 MG/0.8ML Pnkt Generic drug: Adalimumab Inject 40 mg into the skin once a week.   hydrALAZINE 100 MG tablet Commonly known as: APRESOLINE Take 1  tablet (100 mg total) by mouth every 8 (eight) hours.   Insulin Aspart FlexPen 100 UNIT/ML Commonly known as: NOVOLOG Inject 2-12 Units into the skin 4 (four) times daily -  before meals and at bedtime. Per sliding scale 200-249= 2 units 250-299= 4 units 300-349= 6 units 350-399= 8 units 400-449= 10 units  450-499= 12 units 500 or greater= Call MD   insulin glargine-yfgn 100 UNIT/ML injection Commonly known as: SEMGLEE Inject 0.05 mLs (5 Units total) into the skin daily.   levETIRAcetam 750 MG tablet Commonly known as: KEPPRA Take 750 mg by mouth daily at 12 noon. What changed: Another medication with the same name was removed. Continue taking this medication, and follow the directions you see here.   lisinopril 40 MG tablet Commonly known as: ZESTRIL Take 40 mg by mouth daily.   ONE TOUCH ULTRA 2 w/Device Kit Use as directed to check blood sugar three times daily. E11.9   oxymetazoline 0.05 % nasal spray Commonly known as: AFRIN Place 1 spray into both nostrils every 2 (two) hours as needed (breakthrough bleeding).   sevelamer carbonate 800 MG tablet Commonly known as: RENVELA TAKE 1 TABLET(800 MG) BY MOUTH THREE TIMES DAILY What changed: See the new instructions.   spironolactone 25 MG tablet Commonly known as: ALDACTONE Take 25 mg by mouth at bedtime.   THEREMS PO Take 1 tablet by mouth daily. multivitamin               Discharge Care Instructions  (From admission, onward)           Start     Ordered   08/02/21 0000  Discharge wound care:       Comments: Wash buttocks draining "holes" and scrotal draining "hole" with soap and water, pat dry. Place a size appropriate piece of Aquacel Kellie Simmering [phone] over the areas, secure with fishnet underwear if possible. Change daily and prn soilage.   08/02/21 1036   07/31/21 0000  Discharge wound care:       Comments: Clean and dress daily   07/31/21 1104           Allergies  Allergen Reactions    Methotrexate Other (See Comments)    Blood count drops   Vancomycin Shortness Of Breath    Eyes watering, SOB, wheezing   Cefepime Other (See Comments)    Confusion    Tape       The results of significant diagnostics from this  hospitalization (including imaging, microbiology, ancillary and laboratory) are listed below for reference.    Significant Diagnostic Studies: CT HEAD WO CONTRAST (5MM)  Result Date: 07/27/2021 CLINICAL DATA:  Mental status change, unknown cause EXAM: CT HEAD WITHOUT CONTRAST TECHNIQUE: Contiguous axial images were obtained from the base of the skull through the vertex without intravenous contrast. COMPARISON:  06/09/2021 FINDINGS: Brain: There is no acute intracranial hemorrhage, mass effect, or edema. No new loss of gray-white differentiation. Encephalomalacia with areas of calcification again identified in the right frontal lobe and insula. Stable prominence of ventricles and sulci reflecting parenchymal volume loss. Stable patchy and confluent areas of low-attenuation in the cerebral white matter likely reflecting chronic microvascular ischemic changes. Vascular: There is atherosclerotic calcification at the skull base. Skull: Calvarium is unremarkable. Sinuses/Orbits: No acute finding. Other: Partially imaged suboccipital soft tissue density is also present on prior imaging. IMPRESSION: No acute intracranial abnormality. Stable chronic findings detailed above. Electronically Signed   By: Macy Mis M.D.   On: 07/27/2021 08:34   CT PELVIS WO CONTRAST  Result Date: 07/27/2021 CLINICAL DATA:  61 year old male purulent wound right buttock. EXAM: CT PELVIS WITHOUT CONTRAST TECHNIQUE: Multidetector CT imaging of the pelvis was performed following the standard protocol without intravenous contrast. COMPARISON:  CT Abdomen and Pelvis 02/06/2021 CT pelvis with contrast 12/06/2017. FINDINGS: Urinary Tract: Negative noncontrast visible right kidney. No hydroureter is  evident. Diminutive bladder. Bowel: Retained stool throughout the visible large bowel. No dilated large or small bowel loops. Vascular/Lymphatic: Diffuse severe calcified atherosclerosis throughout the visible abdomen and pelvis. Vascular patency is not evaluated in the absence of IV contrast. No lymphadenopathy is evident. Reproductive: Soft tissue wound evident along the right gluteal fold on series 2, image 53. Soft tissue wound in this same region present in 2019, but now appears larger since April. Regional soft tissue thickening, granulation tissue which is inseparable from the anal verge as well as the distal rectum. There is no tracking soft tissue gas. There is no fluid collection evident on this noncontrast exam. Other: Small volume ascites suspected about the liver (series 2, image 1). No definite pelvic free fluid. Paucity of visceral fat now in the abdomen and pelvis. Negative visible noncontrast liver and gallbladder. Musculoskeletal: Stable visualized osseous structures., including the sacrum and coccygeal segments since 2019. Chronic renal osteodystrophy suspected. IMPRESSION: 1. Chronic soft tissue wound of the right gluteal fold. This appears mildly larger since April and might directly involve the anus. But there is no tracking soft tissue, no fluid collection evident on this noncontrast exam, and no pelvic osteomyelitis. 2. Small volume ascites suspected about the visible liver. 3. Chronic renal osteodystrophy suspected. And very severe calcified atherosclerosis throughout the visible abdomen and pelvis. Electronically Signed   By: Genevie Ann M.D.   On: 07/27/2021 08:44   DG Chest Port 1 View  Result Date: 07/31/2021 CLINICAL DATA:  Hemoptysis. History of diabetes, Crohn's disease and stroke. EXAM: PORTABLE CHEST 1 VIEW COMPARISON:  Radiographs 07/27/2021 and 06/09/2021.  CT 06/09/2021. FINDINGS: 1300 hours. Two views obtained. Stable cardiomegaly and mediastinal contours. There is aortic  atherosclerosis and vascular stents in the left brachiocephalic and axillary veins. There are persistent diffuse bilateral airspace opacities with interval improvement on the right. There is a small right pleural effusion. No pneumothorax. The bones appear unchanged. IMPRESSION: Interval partial clearing of diffuse airspace opacities on the right, with remaining opacities nearly symmetric, likely edema or atypical infection. Small right pleural effusion. Electronically Signed   By: Gwyndolyn Saxon  Lin Landsman M.D.   On: 07/31/2021 13:36   DG Chest Portable 1 View  Result Date: 07/27/2021 CLINICAL DATA:  60 year old male with history of hypoglycemia. Dyspnea. Concern for aspiration. EXAM: PORTABLE CHEST 1 VIEW COMPARISON:  Chest x-ray 06/09/2021. FINDINGS: Vascular stents noted in the region of the left axillary vein and innominate vein. There is marked cephalization of the pulmonary vasculature, severe indistinctness of the interstitial markings, and extensive multifocal airspace disease throughout the lungs bilaterally (right greater than left) suggestive of severe pulmonary edema. Small right pleural effusion. No definite left pleural effusion. Heart size is moderately enlarged. Upper mediastinal contours are distorted by patient's rotation to the right. Atherosclerotic calcifications in the thoracic aorta. IMPRESSION: 1. The appearance the chest again suggests severe congestive heart failure, as above. 2. Given the slight asymmetry of the airspace disease, the possibility of superimposed right-sided bronchopneumonia is not excluded, particularly in light of the asymmetric right-sided pleural effusion. 3. Aortic atherosclerosis. Electronically Signed   By: Vinnie Langton M.D.   On: 07/27/2021 07:36    Microbiology: Recent Results (from the past 240 hour(s))  Urine Culture     Status: None   Collection Time: 07/27/21  7:24 AM   Specimen: In/Out Cath Urine  Result Value Ref Range Status   Specimen Description    Final    IN/OUT CATH URINE Performed at [facility], [address], [place], [place] [address]    Special Requests   Final    NONE Performed at [facility], [address], [place], [place] [address]    Culture   Final    NO GROWTH Performed at [facility], [place] [address], [place], [place] [address]    Report Status 07/28/2021 FINAL  Final  Aerobic Culture w Gram Stain (superficial specimen)     Status: None   Collection Time: 07/27/21  7:48 AM   Specimen: Wound  Result Value Ref Range Status   Specimen Description   Final    WOUND Performed at [facility], [address] [place], [place] [address]    Special Requests   Final    NONE Performed at [facility], [place], [place] [address]    Gram Stain   Final    FEW WBC PRESENT,BOTH PMN AND MONONUCLEAR RARE GRAM POSITIVE COCCI IN PAIRS    Culture   Final    FEW CORYNEBACTERIUM STRIATUM Standardized susceptibility testing for this organism is not available. Performed at [facility], [place] [address] [place], [place] [address]    Report Status 07/30/2021 FINAL  Final  Resp Panel by RT-PCR (Flu A&B, Covid) Nasopharyngeal Swab     Status: Abnormal   Collection Time: 07/27/21  7:55 AM   Specimen: Nasopharyngeal Swab; Nasopharyngeal(NP) swabs in vial transport medium  Result Value Ref Range Status   SARS Coronavirus 2 by RT PCR POSITIVE (A) NEGATIVE Final    Comment: RESULT CALLED TO, READ BACK BY AND VERIFIED WITH: Ricke Hey, RN 0900 07/27/21 GM (NOTE) SARS-CoV-2 target nucleic acids are DETECTED.  The SARS-CoV-2 RNA is generally detectable in upper respiratory specimens during the acute phase of infection. Positive results are indicative of the presence of the identified virus, but do not rule out bacterial infection or co-infection with other pathogens not detected by the test. Clinical correlation with patient history and other diagnostic  information is necessary to determine patient infection status. The expected result is Negative.  Fact Sheet for Patients: EntrepreneurPulse.com.au  Fact Sheet for Healthcare Providers: IncredibleEmployment.be  This test is not yet approved or cleared by the [place] FDA and  has been authorized for detection and/or diagnosis of SARS-CoV-2 by FDA under an Emergency Use Authorization (EUA).  This EUA will remain in effect (meaning this test can be  used) for the duration of  the COVID-19 declaration under Section 564(b)(1) of the Act, 21 U.S.C. section 360bbb-3(b)(1), unless the authorization is terminated or revoked sooner.     Influenza A by PCR NEGATIVE NEGATIVE Final   Influenza B by PCR NEGATIVE NEGATIVE Final    Comment: (NOTE) The Xpert Xpress SARS-CoV-2/FLU/RSV plus assay is intended as an aid in the diagnosis of influenza from Nasopharyngeal swab specimens and should not be used as a sole basis for treatment. Nasal washings and aspirates are unacceptable for Xpert Xpress SARS-CoV-2/FLU/RSV testing.  Fact Sheet for Patients: EntrepreneurPulse.com.au  Fact Sheet for Healthcare Providers: IncredibleEmployment.be  This test is not yet approved or cleared by the [place] FDA and has been authorized for detection and/or diagnosis of SARS-CoV-2 by FDA under an Emergency Use Authorization (EUA). This EUA will remain in effect (meaning this test can be used) for the duration of the COVID-19 declaration under Section 564(b)(1) of the Act, 21 U.S.C. section 360bbb-3(b)(1), unless the authorization is terminated or revoked.  Performed at [facility], [address] [place], [place] [address]   Blood culture (routine single)     Status: None   Collection Time: 07/27/21  7:55 AM   Specimen: BLOOD  Result Value Ref Range Status   Specimen Description BLOOD RIGHT ARM  Final   Special  Requests   Final    BOTTLES DRAWN AEROBIC AND ANAEROBIC Blood Culture results may not be optimal due to an excessive volume of blood received in culture bottles   Culture   Final    NO GROWTH 5 DAYS Performed at [facility], [address] [place], [place] [address]    Report Status 08/01/2021 FINAL  Final     Labs: Basic Metabolic Panel: Recent Labs  Lab 07/27/21 0723 07/28/21 0417 07/30/21 0426 07/31/21 0535 08/02/21 0420  NA 135 135 133* 136 136  K 4.4 4.3 4.0 4.6 4.6  CL 98 96* 96* 98 97*  CO2 29 28 30 28 31   GLUCOSE 142* 252* 235* 198* 144*  BUN 58* 43* 40* 51* 49*  CREATININE 5.24* 3.37* 3.33* 4.48* 4.40*  CALCIUM 9.1 8.7* 8.2* 8.9 9.1  MG 1.9  --   --   --   --    Liver Function Tests: Recent Labs  Lab 07/27/21 0723  AST 20  ALT 22  ALKPHOS 94  BILITOT 0.6  PROT 7.0  ALBUMIN 2.3*   Recent Labs  Lab 07/27/21 0723  LIPASE 31   No results for input(s): AMMONIA in the last 168 hours. CBC: Recent Labs  Lab 07/27/21 0723 07/28/21 0417 07/29/21 2211 07/30/21 0426 07/31/21 0535 08/01/21 0537 08/01/21 1159 08/02/21 0420  WBC 16.6*   < > 9.2 9.4 11.6* 9.8  --  9.3  NEUTROABS 15.4*  --   --   --   --   --   --   --   HGB 6.0*   < > 7.8* 7.1* 7.9* 6.8* 6.9* 7.8*  HCT 20.0*   < > 25.2* 23.5* 25.5* 22.4*  --  25.4*  MCV 98.0   < > 94.4 93.6 92.7 95.3  --  93.0  PLT 224   < >  164 170 166 135*  --  168   < > = values in this interval not displayed.   Cardiac Enzymes: No results for input(s): CKTOTAL, CKMB, CKMBINDEX, TROPONINI in the last 168 hours. BNP: BNP (last 3 results) Recent Labs    05/11/21 1628 05/12/21 1928 05/18/21 0143  BNP 3,779.1* >4,500.0* >4,500.0*    ProBNP (last 3 results) No results for input(s): PROBNP in the last 8760 hours.  CBG: Recent Labs  Lab 08/01/21 0729 08/01/21 1151 08/01/21 1619 08/01/21 2237 08/02/21 0809  GLUCAP 334* 216* 218* 269* 141*       Signed:  Desma Maxim MD.  [facility] 08/02/2021, 10:36 AM

## 2021-08-02 NOTE — Progress Notes (Signed)
Transportation contacted.

## 2021-08-02 NOTE — Progress Notes (Signed)
[facility], [place] 08/02/21  Subjective:   Hospital day # 6  Patient seen resting in bed, ill-appearing Reports small nosebleed overnight Continues to complain of fatigue  Objective:  Vital signs in last 24 hours:  Temp:  [98 F (36.7 C)-99.4 F (37.4 C)] 98 F (36.7 C) (10/16 0808) Pulse Rate:  [64-74] 68 (10/16 0808) Resp:  [16-20] 20 (10/16 0808) BP: (154-168)/(67-82) 166/82 (10/16 0808) SpO2:  [92 %-98 %] 98 % (10/16 0808) Weight:  [65.9 kg] 65.9 kg (10/16 0437)  Weight change: 1.9 kg Filed Weights   07/31/21 1206 08/01/21 0423 08/02/21 0437  Weight: 63.1 kg 64.1 kg 65.9 kg    Intake/Output:    Intake/Output Summary (Last 24 hours) at 08/02/2021 1145 Last data filed at 08/01/2021 2219 Gross per 24 hour  Intake 427.83 ml  Output --  Net 427.83 ml    Physical Exam: General:  No acute distress,   HEENT  anicteric, moist oral mucous membrane  Pulm/lungs  normal breathing effort, room air, clear  CVS/Heart  no rub or gallop  Abdomen:   Soft, nontender  Extremities:  No peripheral edema, b/l BKA  Neurologic:  Alert, oriented, able to follow commands  Skin:  No acute rashes, sacral wound  Left arm AVF  Basic Metabolic Panel:  Recent Labs  Lab 07/27/21 0723 07/28/21 0417 07/30/21 0426 07/31/21 0535 08/02/21 0420  NA 135 135 133* 136 136  K 4.4 4.3 4.0 4.6 4.6  CL 98 96* 96* 98 97*  CO2 29 28 30 28 31   GLUCOSE 142* 252* 235* 198* 144*  BUN 58* 43* 40* 51* 49*  CREATININE 5.24* 3.37* 3.33* 4.48* 4.40*  CALCIUM 9.1 8.7* 8.2* 8.9 9.1  MG 1.9  --   --   --   --       CBC: Recent Labs  Lab 07/27/21 0723 07/28/21 0417 07/29/21 2211 07/30/21 0426 07/31/21 0535 08/01/21 0537 08/01/21 1159 08/02/21 0420  WBC 16.6*   < > 9.2 9.4 11.6* 9.8  --  9.3  NEUTROABS 15.4*  --   --   --   --   --   --   --   HGB 6.0*   < > 7.8* 7.1* 7.9* 6.8* 6.9* 7.8*  HCT 20.0*   < > 25.2* 23.5* 25.5* 22.4*  --  25.4*  MCV 98.0   < > 94.4  93.6 92.7 95.3  --  93.0  PLT 224   < > 164 170 166 135*  --  168   < > = values in this interval not displayed.       Lab Results  Component Value Date   HEPBSAG NON REACTIVE 07/27/2021   HEPBSAB Reactive (A) 07/27/2021      Microbiology:  Recent Results (from the past 240 hour(s))  Urine Culture     Status: None   Collection Time: 07/27/21  7:24 AM   Specimen: In/Out Cath Urine  Result Value Ref Range Status   Specimen Description   Final    IN/OUT CATH URINE Performed at [facility], [address], [place], [place] [address]    Special Requests   Final    NONE Performed at [facility], [address], [place], [place] [address]    Culture   Final    NO GROWTH Performed at [facility], [place] [address], [place], [place] [address]    Report Status 07/28/2021 FINAL  Final  Aerobic Culture w Gram  Stain (superficial specimen)     Status: None   Collection Time: 07/27/21  7:48 AM   Specimen: Wound  Result Value Ref Range Status   Specimen Description   Final    WOUND Performed at [facility], [address], [place], [place] [address]    Special Requests   Final    NONE Performed at [facility], [place], [place] [address]    Gram Stain   Final    FEW WBC PRESENT,BOTH PMN AND MONONUCLEAR RARE GRAM POSITIVE COCCI IN PAIRS    Culture   Final    FEW CORYNEBACTERIUM STRIATUM Standardized susceptibility testing for this organism is not available. Performed at [facility], [place] [address], [place], [place] [address]    Report Status 07/30/2021 FINAL  Final  Resp Panel by RT-PCR (Flu A&B, Covid) Nasopharyngeal Swab     Status: Abnormal   Collection Time: 07/27/21  7:55 AM   Specimen: Nasopharyngeal Swab; Nasopharyngeal(NP) swabs in vial transport medium  Result Value Ref Range Status   SARS Coronavirus 2 by RT PCR POSITIVE (A) NEGATIVE Final    Comment: RESULT CALLED TO, READ BACK BY AND  VERIFIED WITH: Ricke Hey, RN 0900 07/27/21 GM (NOTE) SARS-CoV-2 target nucleic acids are DETECTED.  The SARS-CoV-2 RNA is generally detectable in upper respiratory specimens during the acute phase of infection. Positive results are indicative of the presence of the identified virus, but do not rule out bacterial infection or co-infection with other pathogens not detected by the test. Clinical correlation with patient history and other diagnostic information is necessary to determine patient infection status. The expected result is Negative.  Fact Sheet for Patients: EntrepreneurPulse.com.au  Fact Sheet for Healthcare Providers: IncredibleEmployment.be  This test is not yet approved or cleared by the [place] FDA and  has been authorized for detection and/or diagnosis of SARS-CoV-2 by FDA under an Emergency Use Authorization (EUA).  This EUA will remain in effect (meaning this test can be  used) for the duration of  the COVID-19 declaration under Section 564(b)(1) of the Act, 21 U.S.C. section 360bbb-3(b)(1), unless the authorization is terminated or revoked sooner.     Influenza A by PCR NEGATIVE NEGATIVE Final   Influenza B by PCR NEGATIVE NEGATIVE Final    Comment: (NOTE) The Xpert Xpress SARS-CoV-2/FLU/RSV plus assay is intended as an aid in the diagnosis of influenza from Nasopharyngeal swab specimens and should not be used as a sole basis for treatment. Nasal washings and aspirates are unacceptable for Xpert Xpress SARS-CoV-2/FLU/RSV testing.  Fact Sheet for Patients: EntrepreneurPulse.com.au  Fact Sheet for Healthcare Providers: IncredibleEmployment.be  This test is not yet approved or cleared by the [place] FDA and has been authorized for detection and/or diagnosis of SARS-CoV-2 by FDA under an Emergency Use Authorization (EUA). This EUA will remain in effect (meaning this test can  be used) for the duration of the COVID-19 declaration under Section 564(b)(1) of the Act, 21 U.S.C. section 360bbb-3(b)(1), unless the authorization is terminated or revoked.  Performed at [facility], [address] [place],  [address]   Blood culture (routine single)     Status: None   Collection Time: 07/27/21  7:55 AM   Specimen: BLOOD  Result Value Ref Range Status   Specimen Description BLOOD RIGHT ARM  Final   Special Requests   Final    BOTTLES DRAWN AEROBIC AND ANAEROBIC Blood Culture results may not be optimal due to an excessive  volume of blood received in culture bottles   Culture   Final    NO GROWTH 5 DAYS Performed at [facility], [address] [place], [place] [address]    Report Status 08/01/2021 FINAL  Final    Coagulation Studies: No results for input(s): LABPROT, INR in the last 72 hours.   Urinalysis: No results for input(s): COLORURINE, LABSPEC, PHURINE, GLUCOSEU, HGBUR, BILIRUBINUR, KETONESUR, PROTEINUR, UROBILINOGEN, NITRITE, LEUKOCYTESUR in the last 72 hours.  Invalid input(s): APPERANCEUR     Imaging: DG Chest Port 1 View  Result Date: 07/31/2021 CLINICAL DATA:  Hemoptysis. History of diabetes, Crohn's disease and stroke. EXAM: PORTABLE CHEST 1 VIEW COMPARISON:  Radiographs 07/27/2021 and 06/09/2021.  CT 06/09/2021. FINDINGS: 1300 hours. Two views obtained. Stable cardiomegaly and mediastinal contours. There is aortic atherosclerosis and vascular stents in the left brachiocephalic and axillary veins. There are persistent diffuse bilateral airspace opacities with interval improvement on the right. There is a small right pleural effusion. No pneumothorax. The bones appear unchanged. IMPRESSION: Interval partial clearing of diffuse airspace opacities on the right, with remaining opacities nearly symmetric, likely edema or atypical infection. Small right pleural effusion. Electronically Signed   By: Richardean Sale M.D.   On:  07/31/2021 13:36     Medications:      amLODipine  10 mg Oral QPM   atorvastatin  80 mg Oral Daily   carvedilol  25 mg Oral BID   Chlorhexidine Gluconate Cloth  6 each Topical Q0600   cloNIDine  0.1 mg Oral BID   epoetin (EPOGEN/PROCRIT) injection  4,000 Units Intravenous Q M,W,F-HD   feeding supplement  237 mL Oral TID BM   gabapentin  100 mg Oral QHS   hydrALAZINE  100 mg Oral Q8H   influenza vac split quadrivalent PF  0.5 mL Intramuscular Tomorrow-1000   insulin aspart  0-6 Units Subcutaneous TID WC   insulin glargine-yfgn  5 Units Subcutaneous Daily   levETIRAcetam  500 mg Oral Daily   lisinopril  40 mg Oral Daily   multivitamin  1 tablet Oral QHS   pantoprazole  40 mg Oral Daily   sevelamer carbonate  800 mg Oral TID WC   spironolactone  25 mg Oral QHS   acetaminophen, albuterol, dextromethorphan-guaiFENesin, dextrose, hydrALAZINE, LORazepam, ondansetron (ZOFRAN) IV, oxymetazoline  Assessment/ Plan:  60 y.o. male with  with end stage renal disease on hemodialysis, diabetes mellitus type II, hypertension, CVA, peripheral vascular disease status post bilateral BKA    admitted on 07/27/2021 for Hypoglycemia [E16.2] Sepsis ([place]) [A41.9] Acute on chronic anemia [D64.9] Sepsis with encephalopathy without septic shock, due to unspecified organism ([place]) [A41.9, R65.20, G93.40]  # ESRD # Secondary hyperparathyroidism of renal origin N 25.81  # Anemia of CKD # Covid -19 positive # Aspiration Pneumonia # Epistaxis secondary to hypertension  Plan for dialysis treatment tomorrow. Dialysis coordinator notified of patient and outpatient arrangements made for treatment with Covid accommodations Hgb 7.8. Will continue EPO with dialysis treatments. Has received blood transfusions during this admission.   Calcium remains at goal, will continue sevelamer with meals  Continue Augmentin GI recommends monitoring of H/H at this time. EGD/Colonoscopy performed earlier this year with  duodenitis, colon polyps and erythema of the rectum. ENT consulted for epistaxis   LOS: [place] 10/16/202211:45 [place], [place]

## 2021-08-02 NOTE — Progress Notes (Signed)
Patient is being discharged to [facility].  Called re[port to Marlaine Hind, RN.  A copy of AVS placed in discharge packet for receiving facility.  Awaiting EMS.

## 2021-08-02 NOTE — Progress Notes (Signed)
Message left with [facility] to see if they will accept patient today.

## 2021-08-02 NOTE — TOC Transition Note (Signed)
Transition of Care [facility]) - CM/SW Discharge Note   Patient Details  Name: [name] MRN: [id number] Date of Birth: [birth year]  Transition of Care [facility]) CM/SW Contact:  Raina Mina, [place] Phone Number: 08/02/2021, 10:36 AM   Clinical Narrative:   CSW spoke with Vara Guardian at [facility] who stated they could take patient today.     Final next level of care: [place] Barriers to Discharge: Continued Medical Work up   Patient Goals and CMS Choice     Choice offered to / list presented to : NA (Patient is a resident of [facility], verified by D. Doyle Askew)  Discharge Placement                       Discharge Plan and Services In-house Referral: NA Discharge Planning Services: CM Consult Post Acute Care Choice: Long Term Acute Care (LTAC)                               Social Determinants of Health (SDOH) Interventions     Readmission Risk Interventions Readmission Risk Prevention Plan 07/28/2021 02/10/2021 02/08/2021  Transportation Screening Complete Complete Complete  PCP or Specialist Appt within 5-7 Days - - Complete  Home Care Screening - - Complete  Medication Review (RN CM) - - Complete  HRI or Home Care Consult - Complete -  Palliative Care Screening - Complete -  Medication Review (RN Care Manager) Complete Complete -  PCP or Specialist appointment within 3-5 days of discharge Complete - -  [place] or [place] (No Data) - -  Palliative Care Screening Not Applicable - -  [place] Not Applicable - -  Some recent data might be hidden

## 2021-08-03 LAB — TYPE AND SCREEN
ABO/RH(D): B POS
Antibody Screen: NEGATIVE
Unit division: 0

## 2021-08-03 LAB — BPAM RBC
Blood Product Expiration Date: 202211062359
ISSUE DATE / TIME: 202210151818
Unit Type and Rh: 7300

## 2021-08-04 ENCOUNTER — Ambulatory Visit: Payer: Medicare Other | Admitting: *Deleted

## 2021-08-04 DIAGNOSIS — N186 End stage renal disease: Secondary | ICD-10-CM

## 2021-08-04 DIAGNOSIS — Z992 Dependence on renal dialysis: Secondary | ICD-10-CM

## 2021-08-04 DIAGNOSIS — E1129 Type 2 diabetes mellitus with other diabetic kidney complication: Secondary | ICD-10-CM

## 2021-08-05 NOTE — Patient Instructions (Signed)
Visit Information   Goals Addressed             This Visit's Progress    Find Help in My Community       Timeframe:  Long-Range Goal Priority:  Medium Start Date:    08/04/21                         Expected End Date:     02/02/21                  Follow Up Date 08/12/21    - follow-up on any referrals for help I am given - think ahead to make sure my need does not become an emergency - have a back-up plan - make a list of family or friends that I can call    Why is this important?   Knowing how and where to find help for yourself or family in your neighborhood and community is an important skill.  You will want to take some steps to learn how.    Notes:         The patient verbalized understanding of instructions, educational materials, and care plan provided today and declined offer to receive copy of patient instructions, educational materials, and care plan.   Telephone follow up appointment with care management team member scheduled for:08/11/21   Elliot Gurney, [place] Worker  [facility] Care Management [phone]

## 2021-08-05 NOTE — Chronic Care Management (AMB) (Signed)
Chronic Care Management    Clinical Social Work Note  08/05/2021 Name: [name] MRN: [id number] DOB: 1960-10-19  [name] is a 60 y.o. year old male who is a primary care patient of [name], [name], MD. The CCM team was consulted to assist the patient with chronic disease management and/or care coordination needs related to: Level of Care Concerns.   Collaboration with patient's spouse  for initial visit related to long term facility care in response to provider referral for social work chronic care management and care coordination services.   Consent to Services:  The patient was given information about Chronic Care Management services, agreed to services, and gave verbal consent prior to initiation of services.  Please see initial visit note for detailed documentation.   Patient agreed to services and consent obtained.   Assessment: Review of patient past medical history, allergies, medications, and health status, including review of relevant consultants reports was performed today as part of a comprehensive evaluation and provision of chronic care management and care coordination services.     SDOH (Social Determinants of Health) assessments and interventions performed:    Advanced Directives Status: Not addressed in this encounter.  CCM Care Plan  Allergies  Allergen Reactions   Methotrexate Other (See Comments)    Blood count drops   Vancomycin Shortness Of Breath    Eyes watering, SOB, wheezing   Cefepime Other (See Comments)    Confusion    Tape     Outpatient Encounter Medications as of 08/04/2021  Medication Sig   acetaminophen (TYLENOL) 500 MG tablet Take 1,000 mg by mouth daily as needed for moderate pain or headache.    Alcohol Swabs PADS Use as directed to check blood sugar three times daily for insulin dependent type 2 diabetes.   amLODipine (NORVASC) 10 MG tablet Take 1 tablet (10 mg total) by mouth every evening.   amoxicillin-clavulanate  (AUGMENTIN) 500-125 MG tablet Take 1 tablet (500 mg total) by mouth 2 (two) times daily.   atorvastatin (LIPITOR) 80 MG tablet TAKE 1 TABLET(80 MG) BY MOUTH DAILY (Patient taking differently: Take 80 mg by mouth daily.)   Blood Glucose Monitoring Suppl (ONE TOUCH ULTRA 2) w/Device KIT Use as directed to check blood sugar three times daily. E11.9   carvedilol (COREG) 25 MG tablet Take 1 tablet (25 mg total) by mouth 2 (two) times daily.   cloNIDine (CATAPRES) 0.2 MG tablet Take 0.5 tablets (0.1 mg total) by mouth 2 (two) times daily.   feeding supplement (ENSURE ENLIVE / ENSURE PLUS) LIQD Take 237 mLs by mouth 3 (three) times daily between meals.   gabapentin (NEURONTIN) 100 MG capsule Take 1 capsule (100 mg total) by mouth at bedtime.   [name] PEN 40 MG/0.8ML PNKT Inject 40 mg into the skin once a week.   hydrALAZINE (APRESOLINE) 100 MG tablet Take 1 tablet (100 mg total) by mouth every 8 (eight) hours.   Insulin Aspart FlexPen (NOVOLOG) 100 UNIT/ML Inject 2-12 Units into the skin 4 (four) times daily -  before meals and at bedtime. Per sliding scale 200-249= 2 units 250-299= 4 units 300-349= 6 units 350-399= 8 units 400-449= 10 units  450-499= 12 units 500 or greater= Call MD   insulin glargine-yfgn (SEMGLEE) 100 UNIT/ML injection Inject 0.05 mLs (5 Units total) into the skin daily.   levETIRAcetam (KEPPRA) 750 MG tablet Take 750 mg by mouth daily at 12 noon.   lisinopril (ZESTRIL) 40 MG tablet Take 40 mg by mouth  daily.   Multiple Vitamin (THEREMS PO) Take 1 tablet by mouth daily. multivitamin   oxymetazoline (AFRIN) 0.05 % nasal spray Place 1 spray into both nostrils every 2 (two) hours as needed (breakthrough bleeding).   sevelamer carbonate (RENVELA) 800 MG tablet TAKE 1 TABLET(800 MG) BY MOUTH THREE TIMES DAILY (Patient taking differently: Take 800 mg by mouth 3 (three) times daily with meals.)   spironolactone (ALDACTONE) 25 MG tablet Take 25 mg by mouth at bedtime.   No  facility-administered encounter medications on file as of 08/04/2021.    Patient Active Problem List   Diagnosis Date Noted   Type II diabetes mellitus with renal manifestations ([place]) 07/27/2021   Anemia in ESRD (end-stage renal disease) ([place]) 07/27/2021   Hypoglycemia 07/27/2021   Hypothermia 07/27/2021   COVID-19 virus infection 07/27/2021   Aspiration pneumonia ([place]) 07/27/2021   Unstageable pressure ulcer of sacral region ([place]) 05/22/2021   Acute decompensated heart failure ([place]) 05/19/2021   Acute respiratory failure with hypoxia ([place]) 05/18/2021   Protein-calorie malnutrition, severe 05/14/2021   Acute pulmonary edema (H[facility])    Shortness of breath    Fluid overload 05/12/2021   Cellulitis and abscess of buttock    History of GI bleed 02/06/2021   Long term current use of immunosuppressive drug 02/06/2021   Disorder of skin due to Crohn's disease ([place]) 50/27/7412   Complication of vascular access for dialysis 01/22/2021   Polyp of transverse colon    Acute metabolic encephalopathy 87/86/7672   Acute on chronic anemia 09/47/0962   Chronic systolic CHF (congestive heart failure) ([place]) 11/18/2020   Cerebrovascular disease 09/12/2020   Transient alteration of awareness 09/12/2020   Hyperkalemia 09/12/2020   Mild cognitive impairment 09/30/2019   Peripheral vascular disease ([place]) 06/28/2018   Sepsis ([place]) 01/12/2018   Pressure injury of skin 12/04/2017   S/P bilateral BKA (below knee amputation) ([place]) 10/20/2017   Atherosclerosis of native arteries of extremity with rest pain ([place]) 08/05/2017   History of CVA (cerebrovascular accident) 04/15/2017   Seizure disorder ([place]) 04/15/2017   End stage renal failure on dialysis ([place]) 04/12/2016   Aphthae 02/20/2016   Hidradenitis suppurativa 02/20/2016   Leg pain 02/20/2016   Neuropathy 02/20/2016   Narrowing of intervertebral disc space 08/29/2015   Vascular disorder of lower extremity 08/29/2015   Failure of erection 08/29/2015    Hypercholesteremia 08/29/2015   Hypertension 08/29/2015   Anemia due to chronic kidney disease 05/26/2015   Venous insufficiency of leg 09/04/2014   History of deep vein thrombosis (DVT) of lower extremity 08/16/2014   Prostatic intraepithelial neoplasia 11/02/2013   Elevated prostate specific antigen (PSA) 09/11/2013   Benign prostatic hyperplasia with urinary obstruction 08/13/2013   Spermatocele 08/13/2013   Avitaminosis D 01/25/2013   Type 2 diabetes mellitus with hypoglycemia without coma ([place]) 03/28/2012   Crohn disease ([place]) 08/03/2011    Conditions to be addressed/monitored: DMII and CKD Stage End  ; Level of care concerns  Care Plan : General Social Work (Adult)  Updates made by Vern Claude, LCSW since 08/05/2021 12:00 AM     Problem: CHL AMB "PATIENT-SPECIFIC PROBLEM"   Note:   CARE PLAN ENTRY (see longitudinal plan of care for additional care plan information)  Current Barriers:  Patient with DM and End Stage Renal Disease  in need of assistance with connection to community resources related to long term care-patient currently receiving inpatient rehab at [facility] Knowledge deficits and need for support, education and care coordination related to community  resources support  Level of care concerns-assistance needs for transfer to long term care facility  Clinical Goal(s)   Over the next 90 days, patient's spouse will follow up with Department of Social Services regarding patient's long term care Medicaid application for transition to long term care  Interventions provided by LCSW:  Assessed patient's care coordination needs related to current care and need for long term facility care and discussed ongoing care management follow up  Provided patient's spouse with information about the long term care placement process and need to apply for long term care Medicaid before transfer can be considered Advised patient to continue to work with current facilities  business office and Education officer, museum regarding patient's long term care needs Collaborated with appropriate clinical care team members regarding patient needs Motivational Interviewing employed Allowed patient's spouse to vent her frustrations regarding the placement process Active listening / Reflection utilized  Engineer, petroleum Provided    Patient Self Care Activities & Deficits:  Patient is unable to independently navigate community resource options without care coordination support  Acknowledges deficits and is motivated to resolve concern  Patient's spouse  is able to present to the Department of Social Services to submit the Medicaid application for long term care  as discussed today Unable to perform ADLs independently Unable to perform IADLs independently Strong family or social support  Initial goal documentation        Follow Up Plan: SW will follow up with patient by phone over the next 14 business days       [employer], [place] Worker  [facility] Care Management [phone]

## 2021-08-11 ENCOUNTER — Ambulatory Visit: Payer: Medicare Other | Admitting: *Deleted

## 2021-08-11 DIAGNOSIS — E1129 Type 2 diabetes mellitus with other diabetic kidney complication: Secondary | ICD-10-CM

## 2021-08-11 DIAGNOSIS — N186 End stage renal disease: Secondary | ICD-10-CM

## 2021-08-12 NOTE — Chronic Care Management (AMB) (Signed)
Chronic Care Management    Clinical Social Work Note  08/12/2021 Name: [name] MRN: [id number] DOB: 1961/01/23  [name] is a 60 y.o. year old male who is a primary care patient of [name], [name], MD. The CCM team was consulted to assist the patient with chronic disease management and/or care coordination needs related to: [employer] .   Collaboration with patient's spouse  for follow up visit in response to provider referral for social work chronic care management and care coordination services.   Consent to Services:  The patient was given information about Chronic Care Management services, agreed to services, and gave verbal consent prior to initiation of services.  Please see initial visit note for detailed documentation.   Patient agreed to services and consent obtained.   Assessment: Review of patient past medical history, allergies, medications, and health status, including review of relevant consultants reports was performed today as part of a comprehensive evaluation and provision of chronic care management and care coordination services.     SDOH (Social Determinants of Health) assessments and interventions performed:    Advanced Directives Status: Not addressed in this encounter.  CCM Care Plan  Allergies  Allergen Reactions   Methotrexate Other (See Comments)    Blood count drops   Vancomycin Shortness Of Breath    Eyes watering, SOB, wheezing   Cefepime Other (See Comments)    Confusion    Tape     Outpatient Encounter Medications as of 08/11/2021  Medication Sig   acetaminophen (TYLENOL) 500 MG tablet Take 1,000 mg by mouth daily as needed for moderate pain or headache.    Alcohol Swabs PADS Use as directed to check blood sugar three times daily for insulin dependent type 2 diabetes.   amLODipine (NORVASC) 10 MG tablet Take 1 tablet (10 mg total) by mouth every evening.   amoxicillin-clavulanate (AUGMENTIN) 500-125 MG tablet Take 1  tablet (500 mg total) by mouth 2 (two) times daily.   atorvastatin (LIPITOR) 80 MG tablet TAKE 1 TABLET(80 MG) BY MOUTH DAILY (Patient taking differently: Take 80 mg by mouth daily.)   Blood Glucose Monitoring Suppl (ONE TOUCH ULTRA 2) w/Device KIT Use as directed to check blood sugar three times daily. E11.9   carvedilol (COREG) 25 MG tablet Take 1 tablet (25 mg total) by mouth 2 (two) times daily.   cloNIDine (CATAPRES) 0.2 MG tablet Take 0.5 tablets (0.1 mg total) by mouth 2 (two) times daily.   feeding supplement (ENSURE ENLIVE / ENSURE PLUS) LIQD Take 237 mLs by mouth 3 (three) times daily between meals.   gabapentin (NEURONTIN) 100 MG capsule Take 1 capsule (100 mg total) by mouth at bedtime.   [name] PEN 40 MG/0.8ML PNKT Inject 40 mg into the skin once a week.   hydrALAZINE (APRESOLINE) 100 MG tablet Take 1 tablet (100 mg total) by mouth every 8 (eight) hours.   Insulin Aspart FlexPen (NOVOLOG) 100 UNIT/ML Inject 2-12 Units into the skin 4 (four) times daily -  before meals and at bedtime. Per sliding scale 200-249= 2 units 250-299= 4 units 300-349= 6 units 350-399= 8 units 400-449= 10 units  450-499= 12 units 500 or greater= Call MD   insulin glargine-yfgn (SEMGLEE) 100 UNIT/ML injection Inject 0.05 mLs (5 Units total) into the skin daily.   levETIRAcetam (KEPPRA) 750 MG tablet Take 750 mg by mouth daily at 12 noon.   lisinopril (ZESTRIL) 40 MG tablet Take 40 mg by mouth daily.   Multiple Vitamin (THEREMS  PO) Take 1 tablet by mouth daily. multivitamin   oxymetazoline (AFRIN) 0.05 % nasal spray Place 1 spray into both nostrils every 2 (two) hours as needed (breakthrough bleeding).   sevelamer carbonate (RENVELA) 800 MG tablet TAKE 1 TABLET(800 MG) BY MOUTH THREE TIMES DAILY (Patient taking differently: Take 800 mg by mouth 3 (three) times daily with meals.)   spironolactone (ALDACTONE) 25 MG tablet Take 25 mg by mouth at bedtime.   No facility-administered encounter medications on  file as of 08/11/2021.    Patient Active Problem List   Diagnosis Date Noted   Type II diabetes mellitus with renal manifestations ([place]) 07/27/2021   Anemia in ESRD (end-stage renal disease) ([place]) 07/27/2021   Hypoglycemia 07/27/2021   Hypothermia 07/27/2021   COVID-19 virus infection 07/27/2021   Aspiration pneumonia ([place]) 07/27/2021   Unstageable pressure ulcer of sacral region ([place]) 05/22/2021   Acute decompensated heart failure ([place]) 05/19/2021   Acute respiratory failure with hypoxia ([place]) 05/18/2021   Protein-calorie malnutrition, severe 05/14/2021   Acute pulmonary edema (H[facility])    Shortness of breath    Fluid overload 05/12/2021   Cellulitis and abscess of buttock    History of GI bleed 02/06/2021   Long term current use of immunosuppressive drug 02/06/2021   Disorder of skin due to Crohn's disease ([place]) 00/17/4944   Complication of vascular access for dialysis 01/22/2021   Polyp of transverse colon    Acute metabolic encephalopathy 96/75/9163   Acute on chronic anemia 84/66/5993   Chronic systolic CHF (congestive heart failure) ([place]) 11/18/2020   Cerebrovascular disease 09/12/2020   Transient alteration of awareness 09/12/2020   Hyperkalemia 09/12/2020   Mild cognitive impairment 09/30/2019   Peripheral vascular disease ([place]) 06/28/2018   Sepsis ([place]) 01/12/2018   Pressure injury of skin 12/04/2017   S/P bilateral BKA (below knee amputation) ([place]) 10/20/2017   Atherosclerosis of native arteries of extremity with rest pain ([place]) 08/05/2017   History of CVA (cerebrovascular accident) 04/15/2017   Seizure disorder ([place]) 04/15/2017   End stage renal failure on dialysis ([place]) 04/12/2016   Aphthae 02/20/2016   Hidradenitis suppurativa 02/20/2016   Leg pain 02/20/2016   Neuropathy 02/20/2016   Narrowing of intervertebral disc space 08/29/2015   Vascular disorder of lower extremity 08/29/2015   Failure of erection 08/29/2015   Hypercholesteremia 08/29/2015   Hypertension  08/29/2015   Anemia due to chronic kidney disease 05/26/2015   Venous insufficiency of leg 09/04/2014   History of deep vein thrombosis (DVT) of lower extremity 08/16/2014   Prostatic intraepithelial neoplasia 11/02/2013   Elevated prostate specific antigen (PSA) 09/11/2013   Benign prostatic hyperplasia with urinary obstruction 08/13/2013   Spermatocele 08/13/2013   Avitaminosis D 01/25/2013   Type 2 diabetes mellitus with hypoglycemia without coma ([place]) 03/28/2012   Crohn disease ([place]) 08/03/2011    Conditions to be addressed/monitored:   DMII and CKD Stage End  ; Level of care concerns   Care Plan : General Social Work (Adult)  Updates made by Vern Claude, LCSW since 08/12/2021 12:00 AM     Problem: CHL AMB "PATIENT-SPECIFIC PROBLEM"   Note:   CARE PLAN ENTRY (see longitudinal plan of care for additional care plan information)  Current Barriers:  Patient with DM and End Stage Renal Disease  in need of assistance with connection to community resources related to long term care-patient currently receiving inpatient rehab at [facility] Knowledge deficits and need for support, education and care coordination related to community resources support  Level of care concerns-assistance needs for transfer to long term care facility  Clinical Goal(s)   Over the next 90 days, patient's spouse will follow up with Department of Social Services regarding patient's long term care Medicaid application for transition to long term care  Interventions provided by LCSW:  Assessed patient's care coordination needs related to current care and need for long term facility care and discussed ongoing care management follow up  Followed up on status of  long term care application process Confirmed  with patient's spouse that application has not been submitted to the Department of Social Services Advised patient's spouse to continue to work with current facilities business office and Research officer, political party regarding patient's long term care needs Submission of Medicaid application for long term care encouraged Verbalization of feelings explored regarding decision to transition patient to long term care Grief reaction to long term care decision normalized, caregiver stress acknowledged Collaborated with appropriate clinical care team members regarding patient needs Motivational Interviewing employed Allowed patient's spouse to vent her frustrations regarding the placement process Active listening / Reflection utilized  Engineer, petroleum Provided use of positive coping strategies reinforced Encouraged patient's spouse to contact this Education officer, museum with any additional support needs   Patient Self Care Activities & Deficits:  Patient is unable to independently navigate community resource options without care coordination support  Acknowledges deficits and is motivated to resolve concern  Patient's spouse  is able to present to the Department of Social Services to submit the Medicaid application for long term care  as discussed today Unable to perform ADLs independently Unable to perform IADLs independently Strong family or social support  Please see past updates related to this goal by clicking on the "Past Updates" button in the selected goal         Follow Up Plan:  Patient's spouse to continue to work  with the the staff at the facility  to assist with long term care needs. Patient contact this Education officer, museum with any additional questions or concerns       Elliot Gurney, [place] Worker  [facility] [phone]

## 2021-08-12 NOTE — Patient Instructions (Signed)
Visit Information   Goals Addressed             This Visit's Progress    Find Help in My Community       Timeframe:  Long-Range Goal Priority:  Medium Start Date:    08/04/21                         Expected End Date:     08/11/21                Follow Up Date 08/11/21   - follow-up on any referrals for help I am given - think ahead to make sure my need does not become an emergency - have a back-up plan - make a list of family or friends that I can call  -follow-up on long term care Medicaid application submission   Why is this important?   Knowing how and where to find help for yourself or family in your neighborhood and community is an important skill.  You will want to take some steps to learn how.    Notes:         The patient verbalized understanding of instructions, educational materials, and care plan provided today and declined offer to receive copy of patient instructions, educational materials, and care plan.   No further follow up required: patient's spouse to continue to work with the facility staff to address patient 's long term care needs   Elliot Gurney, [place] Worker  [facility] [phone]

## 2021-08-13 ENCOUNTER — Ambulatory Visit: Payer: Medicare Other | Admitting: Nurse Practitioner

## 2021-08-13 ENCOUNTER — Encounter: Payer: Self-pay | Admitting: Nurse Practitioner

## 2021-08-13 ENCOUNTER — Telehealth: Payer: Self-pay | Admitting: Physician Assistant

## 2021-08-13 NOTE — Telephone Encounter (Signed)
Forms placed on providers desk for review and completion.

## 2021-08-13 NOTE — Progress Notes (Deleted)
Office Visit    Patient Name: [name] Date of Encounter: 08/13/2021  Primary Care Provider:  [name], MD Primary Cardiologist:  Nelva Bush, MD  Chief Complaint    60 year old male with a history of HFrEF, end-stage renal disease on hemodialysis, stroke/intracranial hemorrhage, seizure disorder, transfusion dependent anemia of chronic disease, DVT, peripheral arterial disease status post bilateral BKA's, type 2 diabetes mellitus, question of vascular dementia, hypertension, hyperlipidemia, coronary calcifications on CT, and hydradenitis suppurativa, who presents for follow-up of CHF.  Past Medical History    Past Medical History:  Diagnosis Date   Acute metabolic encephalopathy 33/82/5053   Anemia    Cardiomyopathy ([place])    a. 08/2020 Echo: EF 40-45%, glbo HK, GrII DD; b. 04/2021 Echo: EF 30-35%, glob HK, mild LVH, GrII DD.   Crohn disease ([place])    Diabetes mellitus without complication ([place])    DVT of lower extremity (deep venous thrombosis) ([place]) 2016   Empyema ([place]) 05/20/2017   Encephalopathy 12/04/2017   ESRD (end stage renal disease) on hemodyalisis ([place])    Fall at home, initial encounter 09/12/2020   HFrEF (heart failure with reduced ejection fraction) ([place])    a. 08/2020 Echo: EF 40-45%, glbo HK, mod conc LVH, GrII DD, Ao root 58m; b. 04/2021 Echo: EF 30-35%, glob HK, mild LVH, GrII DD, mod red RV fxn, nl PASP, sev dil LA, mild-mod MR, mod TR, mild-mod AoV sclerosis w/o stenosis.   Hidradenitis suppurativa    Hypertension    ICH (intracerebral hemorrhage) ([place]    Peritonitis ([place] 04/21/2017   Pyogenic arthritis of knee ([place] 02/04/2016   Sepsis ([place] 01/12/2018   Stroke ([facility]    Past Surgical History:  Procedure Laterality Date   A/V FISTULAGRAM Left 02/24/2021   Procedure: A/V FISTULAGRAM;  Surgeon: SKatha Cabal MD;  [facility];  Service: Cardiovascular;  Laterality: Left;   ABDOMINAL SURGERY     AMPUTATION  FINGER Left 06/2019   PR AMPUTATION LONG FINGER/THUMB+FLAPS [facility]   [facility]LASTY [facility]    [facility]   BELOW KNEE LEG AMPUTATION Right 08/2017   [facility]   COLONOSCOPY     COLONOSCOPY WITH PROPOFOL N/A 10/28/2020   Procedure: COLONOSCOPY WITH PROPOFOL;  Surgeon: VLin Landsman MD;  Location: [facility]  Service: Gastroenterology;  Laterality: N/A;   COLONOSCOPY WITH PROPOFOL N/A 11/21/2020   Procedure: COLONOSCOPY WITH PROPOFOL;  Surgeon: WLucilla Lame MD;  [facility];  Service: Endoscopy;  Laterality: N/A;   DIALYSIS/PERMA CATHETER INSERTION N/A 12/09/2017   Procedure: DIALYSIS/PERMA CATHETER INSERTION;  Surgeon: SKatha Cabal MD;  [facility];  Service: Cardiovascular;  Laterality: N/A;   DIALYSIS/PERMA CATHETER INSERTION N/A 12/12/2017   Procedure: DIALYSIS/PERMA CATHETER INSERTION;  Surgeon: DAlgernon Huxley MD;  [facility];  Service: Cardiovascular;  Laterality: N/A;   DIALYSIS/PERMA CATHETER REMOVAL Left 12/09/2017   Procedure: DIALYSIS/PERMA CATHETER REMOVAL;  Surgeon: SKatha Cabal MD;  [facility];  Service: Cardiovascular;  Laterality: Left;   ESOPHAGOGASTRODUODENOSCOPY (EGD) WITH PROPOFOL N/A 11/20/2020   Procedure: ESOPHAGOGASTRODUODENOSCOPY (EGD) WITH PROPOFOL;  Surgeon: WLucilla Lame MD;  Location: [facility] ENDOSCOPY;  Service: Endoscopy;  Laterality: N/A;   KNEE SURGERY Left 02/04/2016   [facility]   LEG AMPUTATION THROUGH LOWER TIBIA AND FIBULA Left 06/22/2018   [facility]   LOWER EXTREMITY ANGIOGRAPHY Right 08/08/2017   Procedure: Lower Extremity Angiography;  Surgeon: DAlgernon Huxley MD;  [facility];  Service: Cardiovascular;  Laterality: Right;   LOWER EXTREMITY ANGIOGRAPHY Right 08/22/2017   Procedure: Lower Extremity Angiography;  Surgeon: Algernon Huxley, MD;  Location: [place] CV LAB;  Service: Cardiovascular;  Laterality: Right;   LOWER EXTREMITY INTERVENTION  08/08/2017    Procedure: LOWER EXTREMITY INTERVENTION;  Surgeon: Algernon Huxley, MD;  Location: [place] [facility] LAB;  Service: Cardiovascular;;   LOWER EXTREMITY INTERVENTION  08/22/2017   Procedure: LOWER EXTREMITY INTERVENTION;  Surgeon: Algernon Huxley, MD;  Location: [place] [facility] LAB;  Service: Cardiovascular;;    Allergies  Allergies  Allergen Reactions   Methotrexate Other (See Comments)    Blood count drops   Vancomycin Shortness Of Breath    Eyes watering, SOB, wheezing   Cefepime Other (See Comments)    Confusion    Tape     History of Present Illness    60 year old male with a history of HFrEF, end-stage renal disease on hemodialysis, stroke/intracranial hemorrhage, seizure disorder, transfusion dependent anemia of chronic disease, DVT, peripheral arterial disease status post bilateral BKA's, type 2 diabetes mellitus, question of vascular dementia, hypertension, hyperlipidemia, coronary calcifications on CT, and hydradenitis suppurativa.  Prior echo in November 2021 showed an EF of 40 to 45% with global hypokinesis, grade 2 diastolic dysfunction, normal RV function.  More recently, he was admitted to [facility] in July 2022 with weakness and falls.  He was volume overloaded and underwent hemodialysis.  Echo showed an EF of 30 to 35% global hypokinesis, mild LVH, grade 2 diastolic dysfunction, moderately reduced RV systolic function with enlarged RV cavity, severe left atrial enlargement, large left pleural effusion, mild to moderate MR, moderate TR, and mild to moderate aortic sclerosis without stenosis.  He required readmission in August 2022 in the setting of hypoxic respiratory failure requiring BiPAP secondary to pneumonia, pleural effusion, and exacerbated by worsening anemia with a hemoglobin of 5.8, requiring transfusion.  He required left-sided thoracentesis.  CT of the chest was negative for PE though did show coronary calcifications.  It was during that admission that we were consulted  regarding his LV dysfunction noted on prior echo.  In the setting of transfusion dependent anemia with acute illness, he was not felt to be a candidate for ischemic evaluation.  He was subsequently discharged home on medical therapy including beta-blocker, hydralazine/nitrate, ACE inhibitor, and spironolactone.  He required readmission in late August with mental status changes, hypoxia, and hypoglycemia.  CT of the head was without acute findings.  CT of the chest showed bilateral pleural effusions moderate pulmonary interstitial airspace edema worsened from prior exam.  He also had new multifocal airspace opacities and diffuse body wall edema.  As it had been during prior hospitalizations, troponin was minimally elevated with a flat trend, peaking at 38.  He was not seen by cardiology during that admission.  Patient followed up in cardiology clinic on September 15, at which time he was doing relatively well.  He was euvolemic on examination.  Spironolactone was discontinued given history of end-stage renal disease and concern for hyperkalemia.  Ischemic evaluation was discussed and ultimately, patient and family preferred conservative medical management for the time being.   Current Outpatient Medications  Medication Sig Dispense Refill   acetaminophen (TYLENOL) 500 MG tablet Take 1,000 mg by mouth daily as needed for moderate pain or headache.      Alcohol Swabs PADS Use as directed to check blood sugar three times daily for insulin dependent type 2 diabetes. 100  each 12   amLODipine (NORVASC) 10 MG tablet Take 1 tablet (10 mg total) by mouth every evening.     amoxicillin-clavulanate (AUGMENTIN) 500-125 MG tablet Take 1 tablet (500 mg total) by mouth 2 (two) times daily. 14 tablet 0   atorvastatin (LIPITOR) 80 MG tablet TAKE 1 TABLET(80 MG) BY MOUTH DAILY (Patient taking differently: Take 80 mg by mouth daily.) 90 tablet 3   Blood Glucose Monitoring Suppl (ONE TOUCH ULTRA 2) w/Device KIT Use as  directed to check blood sugar three times daily. E11.9 1 each 0   carvedilol (COREG) 25 MG tablet Take 1 tablet (25 mg total) by mouth 2 (two) times daily. 60 tablet 12   cloNIDine (CATAPRES) 0.2 MG tablet Take 0.5 tablets (0.1 mg total) by mouth 2 (two) times daily.     feeding supplement (ENSURE ENLIVE / ENSURE PLUS) LIQD Take 237 mLs by mouth 3 (three) times daily between meals. 237 mL 12   gabapentin (NEURONTIN) 100 MG capsule Take 1 capsule (100 mg total) by mouth at bedtime.     [name] PEN 40 MG/0.8ML PNKT Inject 40 mg into the skin once a week.     hydrALAZINE (APRESOLINE) 100 MG tablet Take 1 tablet (100 mg total) by mouth every 8 (eight) hours. 90 tablet 1   Insulin Aspart FlexPen (NOVOLOG) 100 UNIT/ML Inject 2-12 Units into the skin 4 (four) times daily -  before meals and at bedtime. Per sliding scale 200-249= 2 units 250-299= 4 units 300-349= 6 units 350-399= 8 units 400-449= 10 units  450-499= 12 units 500 or greater= Call MD     insulin glargine-yfgn (SEMGLEE) 100 UNIT/ML injection Inject 0.05 mLs (5 Units total) into the skin daily. 10 mL 11   levETIRAcetam (KEPPRA) 750 MG tablet Take 750 mg by mouth daily at 12 noon.     lisinopril (ZESTRIL) 40 MG tablet Take 40 mg by mouth daily.     Multiple Vitamin (THEREMS PO) Take 1 tablet by mouth daily. multivitamin     oxymetazoline (AFRIN) 0.05 % nasal spray Place 1 spray into both nostrils every 2 (two) hours as needed (breakthrough bleeding). 30 mL 0   sevelamer carbonate (RENVELA) 800 MG tablet TAKE 1 TABLET(800 MG) BY MOUTH THREE TIMES DAILY (Patient taking differently: Take 800 mg by mouth 3 (three) times daily with meals.) 270 tablet 0   spironolactone (ALDACTONE) 25 MG tablet Take 25 mg by mouth at bedtime.     No current facility-administered medications for this visit.     Review of Systems    ***.  All other systems reviewed and are otherwise negative except as noted above.  Physical Exam    VS:  There were no vitals  taken for this visit. , BMI There is no height or weight on file to calculate BMI.     GEN: Well nourished, well developed, in no acute distress. HEENT: normal. Neck: Supple, no JVD, carotid bruits, or masses. Cardiac: RRR, no murmurs, rubs, or gallops. No clubbing, cyanosis, edema.  Radials/DP/PT 2+ and equal bilaterally.  Respiratory:  Respirations regular and unlabored, clear to auscultation bilaterally. GI: Soft, nontender, nondistended, BS + x 4. MS: no deformity or atrophy. Skin: warm and dry, no rash. Neuro:  Strength and sensation are intact. Psych: Normal affect.  Accessory Clinical Findings    ECG personally reviewed by me today - *** - no acute changes.  Lab Results  Component Value Date   WBC 9.3 08/02/2021   HGB 7.8 (L) 08/02/2021  HCT 25.4 (L) 08/02/2021   MCV 93.0 08/02/2021   PLT 168 08/02/2021   Lab Results  Component Value Date   CREATININE 4.40 (H) 08/02/2021   BUN 49 (H) 08/02/2021   NA 136 08/02/2021   K 4.6 08/02/2021   CL 97 (L) 08/02/2021   CO2 31 08/02/2021   Lab Results  Component Value Date   ALT 22 07/27/2021   AST 20 07/27/2021   ALKPHOS 94 07/27/2021   BILITOT 0.6 07/27/2021   Lab Results  Component Value Date   CHOL 109 09/13/2020   HDL 40 (L) 09/13/2020   LDLCALC 51 09/13/2020   TRIG 90 09/13/2020   CHOLHDL 2.7 09/13/2020    Lab Results  Component Value Date   HGBA1C 6.5 (H) 07/29/2021    Assessment & Plan    1.  ***   Murray Hodgkins, NP 08/13/2021, 9:46 AM

## 2021-08-13 NOTE — Telephone Encounter (Signed)
Recieved request from : patient for [facility]  Placed in nurse box for completion  as form is lengthy and patient in a facility and not present  ROI and payment pending    Pam- do not send pending roi bring to me upon completion

## 2021-08-13 NOTE — Telephone Encounter (Signed)
Spoke with patients wife per release form and advised that forms would need to be completed by his primary care provider for his most recent stay in the hospital. She states they will not complete the forms and so she just needs the portion we can complete based on the cardiac standpoint. Advised that I would update provider on the forms needed and we would work on that portion. She was appreciative for any assistance we can provide.

## 2021-08-14 NOTE — Telephone Encounter (Signed)
Provider reviewed forms and patient does not have any of the listed cardiac conditions. He returned forms to me and will call patients wife.   Called and spoke with patients wife per release form. We reviewed forms provided and the questions that it covers for cardiology. She was understanding that he does not have any of those conditions mentioned in the forms and then we discussed other questions on the form. She states that his renal function has been long standing from over 2 years so therefore that does not qualify. Reviewed other questions and she reports patient has not been able to see neurology and they are still waiting on that. Provided emotional support and understanding of her frustrations with getting these forms completed. She was appreciative for the efforts and would like to come by on Monday to pick up forms.   Will place forms on [employer] desk and send her this note so she will know patients wife will be coming in to pick them up.

## 2021-08-17 DIAGNOSIS — I5022 Chronic systolic (congestive) heart failure: Secondary | ICD-10-CM

## 2021-08-17 DIAGNOSIS — E1129 Type 2 diabetes mellitus with other diabetic kidney complication: Secondary | ICD-10-CM | POA: Diagnosis not present

## 2021-08-17 DIAGNOSIS — N186 End stage renal disease: Secondary | ICD-10-CM

## 2021-08-17 DIAGNOSIS — Z794 Long term (current) use of insulin: Secondary | ICD-10-CM

## 2021-08-17 DIAGNOSIS — Z992 Dependence on renal dialysis: Secondary | ICD-10-CM

## 2021-08-17 NOTE — Telephone Encounter (Signed)
Forms placed in envelope for patient wife to pick up at front desk .

## 2021-08-24 ENCOUNTER — Ambulatory Visit: Payer: Self-pay

## 2021-08-24 DIAGNOSIS — I5022 Chronic systolic (congestive) heart failure: Secondary | ICD-10-CM

## 2021-08-24 DIAGNOSIS — Z9181 History of falling: Secondary | ICD-10-CM

## 2021-08-24 NOTE — Chronic Care Management (AMB) (Signed)
  Chronic Care Management   CCM RN Visit Note  08/24/2021 Name: [name] MRN: [id number] DOB: November 09, 1960  Subjective: [name] is a 60 y.o. year old male who is a primary care patient of [name], [name], MD.   Called received from [name] spouse [name]. Reports [name] is still completing rehabilitation at [facility]. Expressed concerns regarding skilled rehabilitation services. Requesting to speak with the CCM LCSW.    PLAN Message relayed to CCM LCSW. [name] will contact the care management team if assistance is needed prior to [name] discharge/transfer.   [facility] Care Management [facility] [phone]

## 2021-08-25 ENCOUNTER — Ambulatory Visit: Payer: Self-pay | Admitting: *Deleted

## 2021-08-25 ENCOUNTER — Other Ambulatory Visit: Payer: Self-pay

## 2021-08-25 ENCOUNTER — Ambulatory Visit (INDEPENDENT_AMBULATORY_CARE_PROVIDER_SITE_OTHER): Payer: Medicare Other | Admitting: Nurse Practitioner

## 2021-08-25 ENCOUNTER — Encounter: Payer: Self-pay | Admitting: Nurse Practitioner

## 2021-08-25 VITALS — BP 150/70 | HR 63 | Ht 66.0 in

## 2021-08-25 DIAGNOSIS — R0600 Dyspnea, unspecified: Secondary | ICD-10-CM

## 2021-08-25 DIAGNOSIS — Z794 Long term (current) use of insulin: Secondary | ICD-10-CM

## 2021-08-25 DIAGNOSIS — E1129 Type 2 diabetes mellitus with other diabetic kidney complication: Secondary | ICD-10-CM

## 2021-08-25 DIAGNOSIS — N186 End stage renal disease: Secondary | ICD-10-CM

## 2021-08-25 DIAGNOSIS — I10 Essential (primary) hypertension: Secondary | ICD-10-CM | POA: Diagnosis not present

## 2021-08-25 DIAGNOSIS — I420 Dilated cardiomyopathy: Secondary | ICD-10-CM

## 2021-08-25 DIAGNOSIS — I739 Peripheral vascular disease, unspecified: Secondary | ICD-10-CM

## 2021-08-25 DIAGNOSIS — I248 Other forms of acute ischemic heart disease: Secondary | ICD-10-CM | POA: Diagnosis not present

## 2021-08-25 DIAGNOSIS — I5022 Chronic systolic (congestive) heart failure: Secondary | ICD-10-CM

## 2021-08-25 DIAGNOSIS — D631 Anemia in chronic kidney disease: Secondary | ICD-10-CM

## 2021-08-25 DIAGNOSIS — Z992 Dependence on renal dialysis: Secondary | ICD-10-CM

## 2021-08-25 DIAGNOSIS — E785 Hyperlipidemia, unspecified: Secondary | ICD-10-CM

## 2021-08-25 NOTE — Progress Notes (Addendum)
Office Visit    Patient Name: [name] Date of Encounter: 08/25/2021  Primary Care Provider:  [name], MD Primary Cardiologist:  Nelva Bush, MD  Chief Complaint    60 year old male with a history of HFrEF, end-stage renal disease on hemodialysis, stroke/intracranial hemorrhage, seizure disorder, transfusion dependent anemia of chronic disease, DVT, peripheral arterial disease status post bilateral BKA's, type 2 diabetes mellitus, question of vascular dementia, hypertension, hyperlipidemia, coronary calcifications on CT, and hydradenitis suppurativa, who presents for follow-up of CHF.  Past Medical History    Past Medical History:  Diagnosis Date   Acute metabolic encephalopathy 39/76/7341   Anemia    Cardiomyopathy ([place])    a. 08/2020 Echo: EF 40-45%, glbo HK, GrII DD; b. 04/2021 Echo: EF 30-35%, glob HK, mild LVH, GrII DD.   Coronary artery calcification seen on CT scan    Crohn disease ([place])    Diabetes mellitus without complication ([place])    DVT of lower extremity (deep venous thrombosis) ([place]) 2016   Empyema ([place]) 05/20/2017   Encephalopathy 12/04/2017   ESRD (end stage renal disease) on hemodyalisis ([place])    Fall at home, initial encounter 09/12/2020   HFrEF (heart failure with reduced ejection fraction) ([place])    a. 08/2020 Echo: EF 40-45%, glbo HK, mod conc LVH, GrII DD, Ao root 18m; b. 04/2021 Echo: EF 30-35%, glob HK, mild LVH, GrII DD, mod red RV fxn, nl PASP, sev dil LA, mild-mod MR, mod TR, mild-mod AoV sclerosis w/o stenosis.   Hidradenitis suppurativa    Hypertension    ICH (intracerebral hemorrhage) ([place]    Peritonitis ([place] 04/21/2017   Pyogenic arthritis of knee ([place] 02/04/2016   Sepsis ([place] 01/12/2018   Stroke ([facility]    Past Surgical History:  Procedure Laterality Date   A/V FISTULAGRAM Left 02/24/2021   Procedure: A/V FISTULAGRAM;  Surgeon: SKatha Cabal MD;  [facility];  Service: Cardiovascular;   Laterality: Left;   ABDOMINAL SURGERY     AMPUTATION FINGER Left 06/2019   PR AMPUTATION LONG FINGER/THUMB+FLAPS [facility]   [facility]Y [facility]    [facility]   BELOW KNEE LEG AMPUTATION Right 08/2017   [facility]   COLONOSCOPY     COLONOSCOPY WITH PROPOFOL N/A 10/28/2020   Procedure: COLONOSCOPY WITH PROPOFOL;  Surgeon: VLin Landsman MD;  [place]  Service: Gastroenterology;  Laterality: N/A;   COLONOSCOPY WITH PROPOFOL N/A 11/21/2020   Procedure: COLONOSCOPY WITH PROPOFOL;  Surgeon: WLucilla Lame MD;  [facility];  Service: Endoscopy;  Laterality: N/A;   DIALYSIS/PERMA CATHETER INSERTION N/A 12/09/2017   Procedure: DIALYSIS/PERMA CATHETER INSERTION;  Surgeon: SKatha Cabal MD;  [facility];  Service: Cardiovascular;  Laterality: N/A;   DIALYSIS/PERMA CATHETER INSERTION N/A 12/12/2017   Procedure: DIALYSIS/PERMA CATHETER INSERTION;  Surgeon: DAlgernon Huxley MD;  [facility];  Service: Cardiovascular;  Laterality: N/A;   DIALYSIS/PERMA CATHETER REMOVAL Left 12/09/2017   Procedure: DIALYSIS/PERMA CATHETER REMOVAL;  Surgeon: SKatha Cabal MD;  [facility];  Service: Cardiovascular;  Laterality: Left;   ESOPHAGOGASTRODUODENOSCOPY (EGD) WITH PROPOFOL N/A 11/20/2020   Procedure: ESOPHAGOGASTRODUODENOSCOPY (EGD) WITH PROPOFOL;  Surgeon: WLucilla Lame MD;  Location: [facility]OSCOPY;  Service: Endoscopy;  Laterality: N/A;   KNEE SURGERY Left 02/04/2016   [facility]   LEG AMPUTATION THROUGH LOWER TIBIA AND FIBULA Left 06/22/2018   [facility]   LOWER EXTREMITY ANGIOGRAPHY Right 08/08/2017   Procedure: Lower Extremity  Angiography;  Surgeon: Algernon Huxley, MD;  Location: [place] CV LAB;  Service: Cardiovascular;  Laterality: Right;   LOWER EXTREMITY ANGIOGRAPHY Right 08/22/2017   Procedure: Lower Extremity Angiography;  Surgeon: Algernon Huxley, MD;  Location: [place] CV LAB;  Service: Cardiovascular;   Laterality: Right;   LOWER EXTREMITY INTERVENTION  08/08/2017   Procedure: LOWER EXTREMITY INTERVENTION;  Surgeon: Algernon Huxley, MD;  Location: [place] CV LAB;  Service: Cardiovascular;;   LOWER EXTREMITY INTERVENTION  08/22/2017   Procedure: LOWER EXTREMITY INTERVENTION;  Surgeon: Algernon Huxley, MD;  Location: [place] CV LAB;  Service: Cardiovascular;;    Allergies  Allergies  Allergen Reactions   Methotrexate Other (See Comments)    Blood count drops   Vancomycin Shortness Of Breath    Eyes watering, SOB, wheezing   Cefepime Other (See Comments)    Confusion    Tape     History of Present Illness    60 year old male with the above complex past medical history including HFrEF, end-stage renal disease on hemodialysis, stroke/intracranial hemorrhage, seizure disorder, transfusion dependent anemia of chronic disease, DVT, peripheral arterial disease status post bilateral BKA's, type 2 diabetes mellitus, question of vascular dementia, hypertension, hyperlipidemia, coronary calcifications on CT, and hydradenitis suppurativa.  Prior echo in November 2021 showed an EF of 40 to 45% with global hypokinesis, grade 2 diastolic dysfunction, and normal RV function.  More recently, he was admitted to [facility] in July 2022 with weakness and falls.  He was volume overloaded and underwent hemodialysis.  Echocardiogram showed an EF of 30 to 35% with global hypokinesis, mild LVH, grade 2 diastolic dysfunction, moderately reduced RV systolic function with enlarged RV cavity, severe left atrial enlargement, large left pleural effusion, mild to moderate MR, moderate TR, and mild to moderate aortic sclerosis without stenosis.  In August 2022 he was admitted with recurrent hypoxic respiratory failure requiring BiPAP secondary to pneumonia, pleural effusion, and exacerbated by worsening anemia with hemoglobin of 5.8, requiring transfusion.  He required left-sided thoracentesis.  CT of the chest was negative for  PE though did show coronary calcifications.  It was during that admission that we were consulted regarding his LV dysfunction noted on prior echo.  In the setting of transfusion dependent anemia with acute illness, he was not felt to be a candidate for ischemic evaluation.  He was discharged on medical therapy including beta-blocker, hydralazine/nitrate, ACE inhibitor, and spironolactone.  He was readmitted in late August with altered mental status, hypoxia, and hypoglycemia.  CT of the head was without acute findings.  CT of the chest showed bilateral pleural effusions, and moderate pulmonary interstitial airspace edema, worsened from prior exam.  He also had new multifocal airspace opacities and diffuse body wall edema.  As it had been during prior hospitalizations, troponin was minimally elevated at 38 with a flat trend.  He was seen in cardiology clinic on September 15 at which time he was doing well.  Spironolactone was discontinued given end-stage renal disease and concern for hyperkalemia.  Ischemic evaluation was discussed and ultimately, patient family preferred conservative medical management for the time being.  Mr. Vittitow was readmitted on October 10 due to altered mental status and somnolence with a blood glucose of 37.  CT of the head was negative.  Chest x-ray showed pulm edema and possible right infiltrate.  He was treated for suspected aspiration pneumonia.  Hemoglobin was 6.2 and he was transfused 1 unit.  He was noted to have significant epistaxis  and was seen by ENT requiring packing in the right nare.  Epistaxis was felt to be secondary to poorly controlled hypertension.  For hypertension management, he was maintained on clonidine, hydralazine, and lisinopril.  His amlodipine was increased to 10 mg daily and spironolactone was resumed.  Since discharge, he has felt well.  He goes to dialysis on Mondays, Wednesdays, and Fridays, and he tolerates this well without symptoms.  He is otherwise  inactive and spends most of his day either in his bed or wheelchair.  He denies chest pain, dyspnea, palpitations, PND, orthopnea, dizziness, syncope, edema, or early satiety.  He notes some spotting from his right nare yesterday but has not had any frank bleeding.  He has had labs drawn through his dialysis center at [facility], though he is unaware of any results.  At this time, he and his family are not yet interested in pursuing stress testing.  Home Medications    Current Outpatient Medications  Medication Sig Dispense Refill   acetaminophen (TYLENOL) 500 MG tablet Take 1,000 mg by mouth daily as needed for moderate pain or headache.      Alcohol Swabs PADS Use as directed to check blood sugar three times daily for insulin dependent type 2 diabetes. 100 each 12   amLODipine (NORVASC) 10 MG tablet Take 1 tablet (10 mg total) by mouth every evening.     atorvastatin (LIPITOR) 80 MG tablet TAKE 1 TABLET(80 MG) BY MOUTH DAILY 90 tablet 3   Blood Glucose Monitoring Suppl (ONE TOUCH ULTRA 2) w/Device KIT Use as directed to check blood sugar three times daily. E11.9 1 each 0   carvedilol (COREG) 25 MG tablet Take 1 tablet (25 mg total) by mouth 2 (two) times daily. 60 tablet 12   cloNIDine (CATAPRES) 0.2 MG tablet Take 0.5 tablets (0.1 mg total) by mouth 2 (two) times daily.     feeding supplement (ENSURE ENLIVE / ENSURE PLUS) LIQD Take 237 mLs by mouth 3 (three) times daily between meals. 237 mL 12   gabapentin (NEURONTIN) 100 MG capsule Take 1 capsule (100 mg total) by mouth at bedtime.     [name] PEN 40 MG/0.8ML PNKT Inject 40 mg into the skin once a week.     hydrALAZINE (APRESOLINE) 100 MG tablet Take 1 tablet (100 mg total) by mouth every 8 (eight) hours. 90 tablet 1   Insulin Aspart FlexPen (NOVOLOG) 100 UNIT/ML Inject 2-12 Units into the skin 4 (four) times daily -  before meals and at bedtime. Per sliding scale 200-249= 2 units 250-299= 4 units 300-349= 6 units 350-399= 8 units 400-449= 10  units  450-499= 12 units 500 or greater= Call MD     insulin glargine-yfgn (SEMGLEE) 100 UNIT/ML injection Inject 0.05 mLs (5 Units total) into the skin daily. 10 mL 11   levETIRAcetam (KEPPRA) 750 MG tablet Take 750 mg by mouth daily at 12 noon.     lisinopril (ZESTRIL) 40 MG tablet Take 40 mg by mouth daily.     Multiple Vitamin (THEREMS PO) Take 1 tablet by mouth daily. multivitamin     oxymetazoline (AFRIN) 0.05 % nasal spray Place 1 spray into both nostrils every 2 (two) hours as needed (breakthrough bleeding). 30 mL 0   sevelamer carbonate (RENVELA) 800 MG tablet TAKE 1 TABLET(800 MG) BY MOUTH THREE TIMES DAILY 270 tablet 0   spironolactone (ALDACTONE) 25 MG tablet Take 25 mg by mouth at bedtime.     No current facility-administered medications for this visit.  Review of Systems    Overall doing well without chest pain, dyspnea, palpitations, PND, orthopnea, dizziness, syncope, edema, or early satiety.  All other systems reviewed and are otherwise negative except as noted above.    Physical Exam    VS:  BP (!) 150/70 (BP Location: Right Arm, Patient Position: Sitting, Cuff Size: Normal)   Pulse 63   Ht 5' 6"  (1.676 m)   SpO2 98%   BMI 23.45 kg/m  , BMI Body mass index is 23.45 kg/m.     GEN: Thin, frail, in no acute distress. HEENT: normal. Neck: Supple, no JVD, carotid bruits, or masses. Cardiac: RRR, no murmurs, rubs, or gallops. No clubbing, cyanosis, edema.  Radials 2+ and equal bilaterally.  Bilateral BKA's.  Left upper extremity AV fistula with positive bruit and thrill. Respiratory:  Respirations regular and unlabored, clear to auscultation bilaterally. GI: Flat, soft, nontender, nondistended, BS + x 4. MS: no deformity or atrophy. Skin: warm and dry, no rash. Neuro:  Strength and sensation are intact. Psych: Normal affect.  Accessory Clinical Findings    ECG personally reviewed by me today -regular sinus rhythm, 63, LVH- no acute changes.  Lab Results   Component Value Date   WBC 9.3 08/02/2021   HGB 7.8 (L) 08/02/2021   HCT 25.4 (L) 08/02/2021   MCV 93.0 08/02/2021   PLT 168 08/02/2021   Lab Results  Component Value Date   CREATININE 4.40 (H) 08/02/2021   BUN 49 (H) 08/02/2021   NA 136 08/02/2021   K 4.6 08/02/2021   CL 97 (L) 08/02/2021   CO2 31 08/02/2021   Lab Results  Component Value Date   ALT 22 07/27/2021   AST 20 07/27/2021   ALKPHOS 94 07/27/2021   BILITOT 0.6 07/27/2021   Lab Results  Component Value Date   CHOL 109 09/13/2020   HDL 40 (L) 09/13/2020   LDLCALC 51 09/13/2020   TRIG 90 09/13/2020   CHOLHDL 2.7 09/13/2020    Lab Results  Component Value Date   HGBA1C 6.5 (H) 07/29/2021    Assessment & Plan    1.  Chronic heart failure with reduced ejection fraction/dilated cardiomyopathy:  EF 30-35% by echo in 04/2021, with grade 2 diastolic dysfunction.  Ischemic evaluation deferred in the setting of comorbid illness including transfusion dependent anemia.  He was most recently admitted in mid October due to hypoglycemia and aspiration pneumonia.  Since discharge, he notes he has done well without symptoms or limitations.  He is euvolemic on examination today and volume is managed through hemodialysis on Monday, Wednesday, and Friday.  Blood pressure is elevated this morning at 150/70 however, he has yet been given his medications at the skilled nursing facility from which he presents.  He was placed back on spironolactone during his last hospitalization and labs are being followed closely by his nephrologist, per wife.  We again discussed pursuit of ischemic evaluation.  With recent repeat hospitalization, we mutually agreed to hold off and if labs and symptoms are stable in a month, we will pursue a [employer].  He remains on beta-blocker, hydralazine, ACE inhibitor, and spironolactone.  2.  Essential hypertension: Blood pressure elevated at 150/70.  He has yet to receive morning medications.  Continue  current regimen.  3.  End-stage renal disease: Monday Wednesday Friday dialysis through [facility].  4.  Hyperlipidemia: LDL of 51 last November.  Continue atorvastatin.  5.  Type 2 diabetes mellitus: A1c 6.5.  Managed by primary care.  Has  had issues with hypoglycemia.  Wife notes that patient is not eating normally and may need insulin dose titrated downward.  6.  Peripheral arterial disease: Status post bilateral BKA's.  He is not on aspirin in the setting of chronic anemia with recent epistaxis.  LDL at goal on statin.  7.  Transfusion dependent anemia: Per patient and wife, labs recently stable at dialysis.  We do not have access to this.  8.  Disposition: Follow-up in 1 month or sooner if necessary.  Murray Hodgkins, NP 08/25/2021, 8:22 AM

## 2021-08-25 NOTE — Chronic Care Management (AMB) (Deleted)
Care Management Clinical Social Work Note  08/25/2021 Name: [name] MRN: [id number] DOB: 10/12/1961  [name] is a 60 y.o. year old male who is a primary care patient of [name], [name], MD.  The Care Management team was consulted for assistance with chronic disease management and coordination needs.  Collaboration with patient's spouse  for  follow up call at spouse's request  Assessment: Review of patient past medical history, allergies, medications, and health status, including review of relevant consultants reports was performed today as part of a comprehensive evaluation and provision of chronic care management and care coordination services.  SDOH (Social Determinants of Health) assessments and interventions performed:    Advanced Directives Status: Not addressed in this encounter.  Care Plan  Allergies  Allergen Reactions   Methotrexate Other (See Comments)    Blood count drops   Vancomycin Shortness Of Breath    Eyes watering, SOB, wheezing   Cefepime Other (See Comments)    Confusion    Tape     Outpatient Encounter Medications as of 08/25/2021  Medication Sig   acetaminophen (TYLENOL) 500 MG tablet Take 1,000 mg by mouth daily as needed for moderate pain or headache.    Alcohol Swabs PADS Use as directed to check blood sugar three times daily for insulin dependent type 2 diabetes.   amLODipine (NORVASC) 10 MG tablet Take 1 tablet (10 mg total) by mouth every evening.   atorvastatin (LIPITOR) 80 MG tablet TAKE 1 TABLET(80 MG) BY MOUTH DAILY   Blood Glucose Monitoring Suppl (ONE TOUCH ULTRA 2) w/Device KIT Use as directed to check blood sugar three times daily. E11.9   carvedilol (COREG) 25 MG tablet Take 1 tablet (25 mg total) by mouth 2 (two) times daily.   cloNIDine (CATAPRES) 0.2 MG tablet Take 0.5 tablets (0.1 mg total) by mouth 2 (two) times daily.   feeding supplement (ENSURE ENLIVE / ENSURE PLUS) LIQD Take 237 mLs by mouth 3 (three) times daily  between meals.   gabapentin (NEURONTIN) 100 MG capsule Take 1 capsule (100 mg total) by mouth at bedtime.   [name] PEN 40 MG/0.8ML PNKT Inject 40 mg into the skin once a week.   hydrALAZINE (APRESOLINE) 100 MG tablet Take 1 tablet (100 mg total) by mouth every 8 (eight) hours.   Insulin Aspart FlexPen (NOVOLOG) 100 UNIT/ML Inject 2-12 Units into the skin 4 (four) times daily -  before meals and at bedtime. Per sliding scale 200-249= 2 units 250-299= 4 units 300-349= 6 units 350-399= 8 units 400-449= 10 units  450-499= 12 units 500 or greater= Call MD   insulin glargine-yfgn (SEMGLEE) 100 UNIT/ML injection Inject 0.05 mLs (5 Units total) into the skin daily.   levETIRAcetam (KEPPRA) 750 MG tablet Take 750 mg by mouth daily at 12 noon.   lisinopril (ZESTRIL) 40 MG tablet Take 40 mg by mouth daily.   Multiple Vitamin (THEREMS PO) Take 1 tablet by mouth daily. multivitamin   oxymetazoline (AFRIN) 0.05 % nasal spray Place 1 spray into both nostrils every 2 (two) hours as needed (breakthrough bleeding).   sevelamer carbonate (RENVELA) 800 MG tablet TAKE 1 TABLET(800 MG) BY MOUTH THREE TIMES DAILY   spironolactone (ALDACTONE) 25 MG tablet Take 25 mg by mouth at bedtime.   No facility-administered encounter medications on file as of 08/25/2021.    Patient Active Problem List   Diagnosis Date Noted   Type II diabetes mellitus with renal manifestations ([place]) 07/27/2021   Anemia in ESRD (end-stage  renal disease) ([place]) 07/27/2021   Hypoglycemia 07/27/2021   Hypothermia 07/27/2021   COVID-19 virus infection 07/27/2021   Aspiration pneumonia ([place]) 07/27/2021   Unstageable pressure ulcer of sacral region ([place]) 05/22/2021   Acute decompensated heart failure ([place]) 05/19/2021   Acute respiratory failure with hypoxia ([place]) 05/18/2021   Protein-calorie malnutrition, severe 05/14/2021   Acute pulmonary edema (HCC)    Shortness of breath    Fluid overload 05/12/2021   Cellulitis and abscess of buttock     History of GI bleed 02/06/2021   Long term current use of immunosuppressive drug 02/06/2021   Disorder of skin due to Crohn's disease ([place]) 29/93/7169   Complication of vascular access for dialysis 01/22/2021   Polyp of transverse colon    Acute metabolic encephalopathy 67/89/3810   Acute on chronic anemia 17/51/0258   Chronic systolic CHF (congestive heart failure) ([place]) 11/18/2020   Cerebrovascular disease 09/12/2020   Transient alteration of awareness 09/12/2020   Hyperkalemia 09/12/2020   Mild cognitive impairment 09/30/2019   Peripheral vascular disease ([place]) 06/28/2018   Sepsis ([place]) 01/12/2018   Pressure injury of skin 12/04/2017   S/P bilateral BKA (below knee amputation) ([place]) 10/20/2017   Atherosclerosis of native arteries of extremity with rest pain ([place]) 08/05/2017   History of CVA (cerebrovascular accident) 04/15/2017   Seizure disorder ([place]) 04/15/2017   End stage renal failure on dialysis ([place]) 04/12/2016   Aphthae 02/20/2016   Hidradenitis suppurativa 02/20/2016   Leg pain 02/20/2016   Neuropathy 02/20/2016   Narrowing of intervertebral disc space 08/29/2015   Vascular disorder of lower extremity 08/29/2015   Failure of erection 08/29/2015   Hypercholesteremia 08/29/2015   Hypertension 08/29/2015   Anemia due to chronic kidney disease 05/26/2015   Venous insufficiency of leg 09/04/2014   History of deep vein thrombosis (DVT) of lower extremity 08/16/2014   Prostatic intraepithelial neoplasia 11/02/2013   Elevated prostate specific antigen (PSA) 09/11/2013   Benign prostatic hyperplasia with urinary obstruction 08/13/2013   Spermatocele 08/13/2013   Avitaminosis D 01/25/2013   Type 2 diabetes mellitus with hypoglycemia without coma ([place]) 03/28/2012   Crohn disease ([place]) 08/03/2011    Conditions to be addressed/monitored:  DMII and CKD Stage End  ; Level of care concerns   Care Plan : General Social Work (Adult)  Updates made by [employer], Darla Lesches, LCSW  since 08/25/2021 12:00 AM     Problem: CHL AMB "PATIENT-SPECIFIC PROBLEM"   Note:   CARE PLAN ENTRY (see longitudinal plan of care for additional care plan information)  Current Barriers:  Patient with DM and End Stage Renal Disease  in need of assistance with connection to community resources related to long term care-patient currently receiving inpatient rehab at [facility] Knowledge deficits and need for support, education and care coordination related to community resources support  Level of care concerns-assistance needs for transfer to long term care facility  Clinical Goal(s)   Over the next 90 days, patient's spouse will follow up with Department of Social Services regarding patient's long term care Medicaid application for transition to long term care  Interventions provided by LCSW:  Assessed patient's care coordination needs related to current care and need for long term facility care and discussed ongoing care management follow up  Followed up on status of  long term care application process  Confirmed  with patient's spouse that application has been submitted to the Department of Social Services-patient's Medicaid pending at this time Advised patient's spouse to continue to work with  current facilities business office and social worker regarding patient's long term care needs and transfer once medicaid is approved Patient spouse discussed frustration regarding patient's placement and missing items Patient confirmed that she has contacted the local ombudsman Genevie Ann [phone] and is awaiting a return call  caregiver stress acknowledged, verbalization of feelings encouraged Collaborated with appropriate clinical care team members regarding patient needs Allowed patient's spouse to vent her frustrations regarding the placement process Active listening / Reflection utilized  Emotional Support Provided use of positive coping strategies reinforced Encouraged patient's  spouse to follow up with the local ombudsman for additional advocacy and support related to patient's placement concerns Patient's spouse encouraged to contact this social worker with any additional support needs   Patient Self Care Activities & Deficits:  Patient is unable to independently navigate community resource options without care coordination support  Acknowledges deficits and is motivated to resolve concern  Patient's spouse  is able to present to the Department of Social Services to submit the Medicaid application for long term care  as discussed today Unable to perform ADLs independently Unable to perform IADLs independently Strong family or social support  Please see past updates related to this goal by clicking on the "Past Updates" button in the selected goal         Follow Up Plan: Client's spouse to contact this Education officer, museum with any additional community support needs.   Elliot Gurney, [place] Worker  [facility] Care Management [phone]

## 2021-08-25 NOTE — Patient Instructions (Signed)
Medication Instructions:  Your physician recommends that you continue on your current medications as directed. Please refer to the Current Medication list given to you today.  *If you need a refill on your cardiac medications before your next appointment, please call your pharmacy*   Lab Work: None ordered  If you have labs (blood work) drawn today and your tests are completely normal, you will receive your results only by: [place] (if you have MyChart) OR A paper copy in the mail If you have any lab test that is abnormal or we need to change your treatment, we will call you to review the results.   Testing/Procedures: None ordered   Follow-Up: At [facility], you and your health needs are our priority.  As part of our continuing mission to provide you with exceptional heart care, we have created designated Provider Care Teams.  These Care Teams include your primary Cardiologist (physician) and Advanced Practice Providers (APPs -  Physician Assistants and Nurse Practitioners) who all work together to provide you with the care you need, when you need it.  We recommend signing up for the patient portal called "MyChart".  Sign up information is provided on this After Visit Summary.  MyChart is used to connect with patients for Virtual Visits (Telemedicine).  Patients are able to view lab/test results, encounter notes, upcoming appointments, etc.  Non-urgent messages can be sent to your provider as well.   To learn more about what you can do with MyChart, go to [email or web address].    Your next appointment:   4 week(s)  The format for your next appointment:   In Person  Provider:   Murray Hodgkins, NP    Other Instructions N/A

## 2021-08-26 NOTE — Chronic Care Management (AMB) (Signed)
Chronic Care Management    Clinical Social Work Note  08/26/2021 Name: [name] MRN: [id number] DOB: October 25, 1960  [name] is a 60 y.o. year old male who is a primary care patient of [name], [name], MD. The CCM team was consulted to assist the patient with chronic disease management and/or care coordination needs related to: [employer]  and Level of Care Concerns.   Collaboration with patient's spouse  in regards to patient's level of care concerns  Consent to Services:  The patient was given information about Chronic Care Management services, agreed to services, and gave verbal consent prior to initiation of services.  Please see initial visit note for detailed documentation.   Patient agreed to services and consent obtained.   Assessment: Review of patient past medical history, allergies, medications, and health status, including review of relevant consultants reports was performed today as part of a comprehensive evaluation and provision of chronic care management and care coordination services.     SDOH (Social Determinants of Health) assessments and interventions performed:    Advanced Directives Status: Not addressed in this encounter.  CCM Care Plan  Allergies  Allergen Reactions   Methotrexate Other (See Comments)    Blood count drops   Vancomycin Shortness Of Breath    Eyes watering, SOB, wheezing   Cefepime Other (See Comments)    Confusion    Tape     Outpatient Encounter Medications as of 08/25/2021  Medication Sig   acetaminophen (TYLENOL) 500 MG tablet Take 1,000 mg by mouth daily as needed for moderate pain or headache.    Alcohol Swabs PADS Use as directed to check blood sugar three times daily for insulin dependent type 2 diabetes.   amLODipine (NORVASC) 10 MG tablet Take 1 tablet (10 mg total) by mouth every evening.   atorvastatin (LIPITOR) 80 MG tablet TAKE 1 TABLET(80 MG) BY MOUTH DAILY   Blood Glucose Monitoring Suppl (ONE TOUCH  ULTRA 2) w/Device KIT Use as directed to check blood sugar three times daily. E11.9   carvedilol (COREG) 25 MG tablet Take 1 tablet (25 mg total) by mouth 2 (two) times daily.   cloNIDine (CATAPRES) 0.2 MG tablet Take 0.5 tablets (0.1 mg total) by mouth 2 (two) times daily.   feeding supplement (ENSURE ENLIVE / ENSURE PLUS) LIQD Take 237 mLs by mouth 3 (three) times daily between meals.   gabapentin (NEURONTIN) 100 MG capsule Take 1 capsule (100 mg total) by mouth at bedtime.   [name] PEN 40 MG/0.8ML PNKT Inject 40 mg into the skin once a week.   hydrALAZINE (APRESOLINE) 100 MG tablet Take 1 tablet (100 mg total) by mouth every 8 (eight) hours.   Insulin Aspart FlexPen (NOVOLOG) 100 UNIT/ML Inject 2-12 Units into the skin 4 (four) times daily -  before meals and at bedtime. Per sliding scale 200-249= 2 units 250-299= 4 units 300-349= 6 units 350-399= 8 units 400-449= 10 units  450-499= 12 units 500 or greater= Call MD   insulin glargine-yfgn (SEMGLEE) 100 UNIT/ML injection Inject 0.05 mLs (5 Units total) into the skin daily.   levETIRAcetam (KEPPRA) 750 MG tablet Take 750 mg by mouth daily at 12 noon.   lisinopril (ZESTRIL) 40 MG tablet Take 40 mg by mouth daily.   Multiple Vitamin (THEREMS PO) Take 1 tablet by mouth daily. multivitamin   oxymetazoline (AFRIN) 0.05 % nasal spray Place 1 spray into both nostrils every 2 (two) hours as needed (breakthrough bleeding).   sevelamer carbonate (RENVELA)  800 MG tablet TAKE 1 TABLET(800 MG) BY MOUTH THREE TIMES DAILY   spironolactone (ALDACTONE) 25 MG tablet Take 25 mg by mouth at bedtime.   No facility-administered encounter medications on file as of 08/25/2021.    Patient Active Problem List   Diagnosis Date Noted   Type II diabetes mellitus with renal manifestations ([place]) 07/27/2021   Anemia in ESRD (end-stage renal disease) ([place]) 07/27/2021   Hypoglycemia 07/27/2021   Hypothermia 07/27/2021   COVID-19 virus infection 07/27/2021    Aspiration pneumonia ([place]) 07/27/2021   Unstageable pressure ulcer of sacral region ([place]) 05/22/2021   Acute decompensated heart failure ([place]) 05/19/2021   Acute respiratory failure with hypoxia ([place]) 05/18/2021   Protein-calorie malnutrition, severe 05/14/2021   Acute pulmonary edema (HCC)    Shortness of breath    Fluid overload 05/12/2021   Cellulitis and abscess of buttock    History of GI bleed 02/06/2021   Long term current use of immunosuppressive drug 02/06/2021   Disorder of skin due to Crohn's disease ([place]) 20/35/5974   Complication of vascular access for dialysis 01/22/2021   Polyp of transverse colon    Acute metabolic encephalopathy 16/38/4536   Acute on chronic anemia 46/80/3212   Chronic systolic CHF (congestive heart failure) ([place]) 11/18/2020   Cerebrovascular disease 09/12/2020   Transient alteration of awareness 09/12/2020   Hyperkalemia 09/12/2020   Mild cognitive impairment 09/30/2019   Peripheral vascular disease ([place]) 06/28/2018   Sepsis ([place]) 01/12/2018   Pressure injury of skin 12/04/2017   S/P bilateral BKA (below knee amputation) ([place]) 10/20/2017   Atherosclerosis of native arteries of extremity with rest pain ([place]) 08/05/2017   History of CVA (cerebrovascular accident) 04/15/2017   Seizure disorder ([place]) 04/15/2017   End stage renal failure on dialysis ([place]) 04/12/2016   Aphthae 02/20/2016   Hidradenitis suppurativa 02/20/2016   Leg pain 02/20/2016   Neuropathy 02/20/2016   Narrowing of intervertebral disc space 08/29/2015   Vascular disorder of lower extremity 08/29/2015   Failure of erection 08/29/2015   Hypercholesteremia 08/29/2015   Hypertension 08/29/2015   Anemia due to chronic kidney disease 05/26/2015   Venous insufficiency of leg 09/04/2014   History of deep vein thrombosis (DVT) of lower extremity 08/16/2014   Prostatic intraepithelial neoplasia 11/02/2013   Elevated prostate specific antigen (PSA) 09/11/2013   Benign prostatic  hyperplasia with urinary obstruction 08/13/2013   Spermatocele 08/13/2013   Avitaminosis D 01/25/2013   Type 2 diabetes mellitus with hypoglycemia without coma ([place]) 03/28/2012   Crohn disease ([place]) 08/03/2011    Conditions to be addressed/monitored:  DMII and CKD Stage End  ; Level of care concerns  Care Plan : General Social Work (Adult)  Updates made by [employer], Darla Lesches, LCSW since 08/26/2021 12:00 AM     Problem: CHL AMB "PATIENT-SPECIFIC PROBLEM"   Note:   CARE PLAN ENTRY (see longitudinal plan of care for additional care plan information)  Current Barriers:  Patient with DM and End Stage Renal Disease  in need of assistance with connection to community resources related to long term care-patient currently receiving inpatient rehab at [facility] Knowledge deficits and need for support, education and care coordination related to community resources support  Level of care concerns-assistance needs for transfer to long term care facility  Clinical Goal(s)   Over the next 90 days, patient's spouse will follow up with Department of Social Services regarding patient's long term care Medicaid application for transition to long term care  Interventions provided by LCSW:  Assessed patient's care coordination needs related to current care and need for long term facility care and discussed ongoing care management follow up  Followed up on status of  long term care application process  Confirmed  with patient's spouse that application has been submitted to the Department of Social Services-patient's Medicaid pending at this time Advised patient's spouse to continue to work with current facilities business office and Education officer, museum regarding patient's long term care needs and transfer once medicaid is approved Patient spouse expressed frustration regarding patient's placement and missing items Patient confirmed that she has contacted the local ombudsman Genevie Ann [phone] and is  awaiting a return call  caregiver stress acknowledged, verbalization of feelings encouraged Collaborated with appropriate clinical care team members regarding patient needs Allowed patient's spouse to vent her frustrations regarding the placement process Active listening / Reflection utilized  Emotional Support Provided use of positive coping strategies reinforced Encouraged patient's spouse to follow up with the local ombudsman for additional advocacy and support related to patient's placement concerns Patient's spouse encouraged to contact this social worker with any additional support needs   Patient Self Care Activities & Deficits:  Patient is unable to independently navigate community resource options without care coordination support  Acknowledges deficits and is motivated to resolve concern  Patient's spouse  is able to present to the Department of Social Services to submit the Medicaid application for long term care  as discussed today Unable to perform ADLs independently Unable to perform IADLs independently Strong family or social support  Please see past updates related to this goal by clicking on the "Past Updates" button in the selected goal          Follow Up Plan:  patient's spouse encouraged to continue to work with facility staff for long term care options for patient       Elliot Gurney, [place] Worker  [facility] [phone]

## 2021-08-26 NOTE — Patient Instructions (Signed)
Visit Information  PATIENT GOALS/PLAN OF CARE:  Care Plan : General Social Work (Adult)  Updates made by [employer], Darla Lesches, LCSW since 08/26/2021 12:00 AM     Problem: CHL AMB "PATIENT-SPECIFIC PROBLEM"   Note:   CARE PLAN ENTRY (see longitudinal plan of care for additional care plan information)  Current Barriers:  Patient with DM and End Stage Renal Disease  in need of assistance with connection to community resources related to long term care-patient currently receiving inpatient rehab at [facility] Knowledge deficits and need for support, education and care coordination related to community resources support  Level of care concerns-assistance needs for transfer to long term care facility  Clinical Goal(s)   Over the next 90 days, patient's spouse will follow up with Department of Social Services regarding patient's long term care Medicaid application for transition to long term care  Interventions provided by LCSW:  Assessed patient's care coordination needs related to current care and need for long term facility care and discussed ongoing care management follow up  Followed up on status of  long term care application process  Confirmed  with patient's spouse that application has been submitted to the Department of Social Services-patient's Medicaid pending at this time Advised patient's spouse to continue to work with current facilities business office and Education officer, museum regarding patient's long term care needs and transfer once medicaid is approved Patient spouse expressed frustration regarding patient's placement and missing items Patient confirmed that she has contacted the local ombudsman Genevie Ann [phone] and is awaiting a return call  caregiver stress acknowledged, verbalization of feelings encouraged Collaborated with appropriate clinical care team members regarding patient needs Allowed patient's spouse to vent her frustrations regarding the placement  process Active listening / Reflection utilized  Emotional Support Provided use of positive coping strategies reinforced Encouraged patient's spouse to follow up with the local ombudsman for additional advocacy and support related to patient's placement concerns Patient's spouse encouraged to contact this Education officer, museum with any additional support needs   Patient Self Care Activities & Deficits:  Patient is unable to independently navigate community resource options without care coordination support  Acknowledges deficits and is motivated to resolve concern  Patient's spouse  is able to present to the Department of Social Services to submit the Medicaid application for long term care  as discussed today Unable to perform ADLs independently Unable to perform IADLs independently Strong family or social support  Please see past updates related to this goal by clicking on the "Past Updates" button in the selected goal        The patient verbalized understanding of instructions, educational materials, and care plan provided today and declined offer to receive copy of patient instructions, educational materials, and care plan.   No further follow up required: patient's spouse to continue to work for facility staff to assist with patient's long term goals   Elliot Gurney, [place] Worker  [facility] [phone]

## 2021-09-14 ENCOUNTER — Telehealth: Payer: Self-pay

## 2021-09-14 NOTE — Telephone Encounter (Signed)
Please review. Has this been completed yet?  Thanks,   -Mickel Baas

## 2021-09-14 NOTE — Telephone Encounter (Signed)
Copied from [place] [id number]. Topic: General - Other >> Sep 14, 2021 11:34 AM [name] wrote: Reason for CRM: checking on FMLA paperwork/ please call to update on status and if it has been completed and sent off / please advise

## 2021-09-15 NOTE — Telephone Encounter (Signed)
The FMLA is done and sent to medical records.   They also had a form to stating he has dementia, but this diagnosis requires a cognitive exam such as a mini mental status exam. He needs an office visit for cognitive evaluation.

## 2021-09-16 NOTE — Telephone Encounter (Signed)
Left message advising Mrs. Puller.  PEC please schedule Mr. Welsch for an appointment when she calls back.   Thanks,   -Mickel Baas

## 2021-09-18 NOTE — Telephone Encounter (Signed)
Apt 09/22/2021 at 1:20  (Virtual Visit)  Thanks,   -Mickel Baas

## 2021-09-22 ENCOUNTER — Telehealth (INDEPENDENT_AMBULATORY_CARE_PROVIDER_SITE_OTHER): Payer: Medicare Other | Admitting: Family Medicine

## 2021-09-22 DIAGNOSIS — I248 Other forms of acute ischemic heart disease: Secondary | ICD-10-CM

## 2021-09-22 DIAGNOSIS — F02B0 Dementia in other diseases classified elsewhere, moderate, without behavioral disturbance, psychotic disturbance, mood disturbance, and anxiety: Secondary | ICD-10-CM

## 2021-09-22 NOTE — Progress Notes (Addendum)
MyChart Video Visit    Virtual Visit via Video Note   This visit type was conducted due to national recommendations for restrictions regarding the COVID-19 Pandemic (e.g. social distancing) in an effort to limit this patient's exposure and mitigate transmission in our community. This patient is at least at moderate risk for complications without adequate follow up. This format is felt to be most appropriate for this patient at this time. Physical exam was limited by quality of the video and audio technology used for the visit.   Patient location: home Provider location: bfp  I discussed the limitations of evaluation and management by telemedicine and the availability of in person appointments. The patient expressed understanding and agreed to proceed.  Patient: [name]   DOB: 03-03-1961   60 y.o. Male  MRN: [id number] Visit Date: 09/22/2021  Today's healthcare provider: [name], MD   No chief complaint on file.  Subjective    HPI  Past Medical History:  Diagnosis Date   Acute metabolic encephalopathy 97/35/3299   Anemia    Cardiomyopathy ([place])    a. 08/2020 Echo: EF 40-45%, glbo HK, GrII DD; b. 04/2021 Echo: EF 30-35%, glob HK, mild LVH, GrII DD.   Coronary artery calcification seen on CT scan    Crohn disease ([place])    Diabetes mellitus without complication ([place])    DVT of lower extremity (deep venous thrombosis) ([place]) 2016   Empyema ([place]) 05/20/2017   Encephalopathy 12/04/2017   ESRD (end stage renal disease) on hemodyalisis ([place])    Fall at home, initial encounter 09/12/2020   HFrEF (heart failure with reduced ejection fraction) ([place])    a. 08/2020 Echo: EF 40-45%, glbo HK, mod conc LVH, GrII DD, Ao root 57m; b. 04/2021 Echo: EF 30-35%, glob HK, mild LVH, GrII DD, mod red RV fxn, nl PASP, sev dil LA, mild-mod MR, mod TR, mild-mod AoV sclerosis w/o stenosis.   Hidradenitis suppurativa    Hypertension    ICH (intracerebral hemorrhage) ([place]    Peritonitis  ([place] 04/21/2017   Pyogenic arthritis of knee ([place] 02/04/2016   Sepsis ([place] 01/12/2018   Stroke ([facility]     Is now in long term care facility. Has been increasingly forgetful and confused and no longer able to care for himself. He has a critical illness insurance    Medications: Outpatient Medications Prior to Visit  Medication Sig   acetaminophen (TYLENOL) 500 MG tablet Take 1,000 mg by mouth daily as needed for moderate pain or headache.    Alcohol Swabs PADS Use as directed to check blood sugar three times daily for insulin dependent type 2 diabetes.   amLODipine (NORVASC) 10 MG tablet Take 1 tablet (10 mg total) by mouth every evening.   atorvastatin (LIPITOR) 80 MG tablet TAKE 1 TABLET(80 MG) BY MOUTH DAILY   Blood Glucose Monitoring Suppl (ONE TOUCH ULTRA 2) w/Device KIT Use as directed to check blood sugar three times daily. E11.9   carvedilol (COREG) 25 MG tablet Take 1 tablet (25 mg total) by mouth 2 (two) times daily.   cloNIDine (CATAPRES) 0.2 MG tablet Take 0.5 tablets (0.1 mg total) by mouth 2 (two) times daily.   feeding supplement (ENSURE ENLIVE / ENSURE PLUS) LIQD Take 237 mLs by mouth 3 (three) times daily between meals.   gabapentin (NEURONTIN) 100 MG capsule Take 1 capsule (100 mg total) by mouth at bedtime.   [name] PEN 40 MG/0.8ML PNKT Inject 40 mg into the skin once a week.  hydrALAZINE (APRESOLINE) 100 MG tablet Take 1 tablet (100 mg total) by mouth every 8 (eight) hours.   Insulin Aspart FlexPen (NOVOLOG) 100 UNIT/ML Inject 2-12 Units into the skin 4 (four) times daily -  before meals and at bedtime. Per sliding scale 200-249= 2 units 250-299= 4 units 300-349= 6 units 350-399= 8 units 400-449= 10 units  450-499= 12 units 500 or greater= Call MD   insulin glargine-yfgn (SEMGLEE) 100 UNIT/ML injection Inject 0.05 mLs (5 Units total) into the skin daily.   levETIRAcetam (KEPPRA) 750 MG tablet Take 750 mg by mouth daily at 12 noon.   lisinopril (ZESTRIL) 40 MG  tablet Take 40 mg by mouth daily.   Multiple Vitamin (THEREMS PO) Take 1 tablet by mouth daily. multivitamin   oxymetazoline (AFRIN) 0.05 % nasal spray Place 1 spray into both nostrils every 2 (two) hours as needed (breakthrough bleeding).   sevelamer carbonate (RENVELA) 800 MG tablet TAKE 1 TABLET(800 MG) BY MOUTH THREE TIMES DAILY   spironolactone (ALDACTONE) 25 MG tablet Take 25 mg by mouth at bedtime.   No facility-administered medications prior to visit.    Review of Systems    Objective    There were no vitals taken for this visit.   Physical Exam   Awake, alert, oriented x 2. In no apparent distress   6CIT Screen 09/22/2021  What Year? 0 points  What month? 3 points  What time? 3 points  Count back from 20 2 points  Months in reverse 4 points  Repeat phrase 4 points  Total Score 16     Assessment & Plan     1. Moderate dementia associated with other underlying disease, without behavioral disturbance, psychotic disturbance, mood disturbance, or anxiety  Is no longer able to care for himself due to multiple chronic medication problems and organic dementia. Completed Critical illness insurance forms regarding dementia.     I discussed the assessment and treatment plan with the patient. The patient was provided an opportunity to ask questions and all were answered. The patient agreed with the plan and demonstrated an understanding of the instructions.   The patient was advised to call back or seek an in-person evaluation if the symptoms worsen or if the condition fails to improve as anticipated.  I provided 9 minutes of non-face-to-face time during this encounter.  The entirety of the information documented in the History of Present Illness, Review of Systems and Physical Exam were personally obtained by me. Portions of this information were initially documented by the CMA and reviewed by me for thoroughness and accuracy.    [name], MD [facility] [phone] (phone) [phone] (fax)  [place] done by interview with patient and caregiver on 11-11-2021 find that patient is at [place] 7d (severe) dementia and deteriorating.

## 2021-09-24 ENCOUNTER — Other Ambulatory Visit: Payer: Self-pay

## 2021-09-24 ENCOUNTER — Ambulatory Visit (INDEPENDENT_AMBULATORY_CARE_PROVIDER_SITE_OTHER): Payer: Medicare Other | Admitting: Nurse Practitioner

## 2021-09-24 ENCOUNTER — Encounter: Payer: Self-pay | Admitting: Nurse Practitioner

## 2021-09-24 VITALS — BP 134/61 | HR 65 | Ht 71.0 in | Wt 135.8 lb

## 2021-09-24 DIAGNOSIS — N186 End stage renal disease: Secondary | ICD-10-CM

## 2021-09-24 DIAGNOSIS — I420 Dilated cardiomyopathy: Secondary | ICD-10-CM | POA: Diagnosis not present

## 2021-09-24 DIAGNOSIS — I5022 Chronic systolic (congestive) heart failure: Secondary | ICD-10-CM | POA: Diagnosis not present

## 2021-09-24 DIAGNOSIS — D631 Anemia in chronic kidney disease: Secondary | ICD-10-CM

## 2021-09-24 DIAGNOSIS — I10 Essential (primary) hypertension: Secondary | ICD-10-CM | POA: Diagnosis not present

## 2021-09-24 DIAGNOSIS — I248 Other forms of acute ischemic heart disease: Secondary | ICD-10-CM | POA: Diagnosis not present

## 2021-09-24 DIAGNOSIS — I739 Peripheral vascular disease, unspecified: Secondary | ICD-10-CM

## 2021-09-24 DIAGNOSIS — E785 Hyperlipidemia, unspecified: Secondary | ICD-10-CM

## 2021-09-24 DIAGNOSIS — Z992 Dependence on renal dialysis: Secondary | ICD-10-CM

## 2021-09-24 NOTE — Progress Notes (Signed)
Office Visit    Patient Name: [name] Date of Encounter: 09/24/2021  Primary Care Provider:  [name], MD Primary Cardiologist:  Nelva Bush, MD  Chief Complaint    60 year old male with a history of HFrEF, end-stage renal disease on hemodialysis, stroke/intracranial hemorrhage, seizure disorder, transfusion dependent anemia of chronic disease, DVT, peripheral arterial disease status post bilateral BKA's, type 2 diabetes mellitus, question of vascular dementia, hypertension, hyperlipidemia, coronary calcifications on CT, and hydradenitis suppurativa, who presents for follow-up related to heart failure and dilated cardiomyopathy.  Past Medical History    Past Medical History:  Diagnosis Date   Acute metabolic encephalopathy 63/33/5456   Anemia    Cardiomyopathy ([place])    a. 08/2020 Echo: EF 40-45%, glbo HK, GrII DD; b. 04/2021 Echo: EF 30-35%, glob HK, mild LVH, GrII DD.   Coronary artery calcification seen on CT scan    Crohn disease ([place])    Diabetes mellitus without complication ([place])    DVT of lower extremity (deep venous thrombosis) ([place]) 2016   Empyema ([place]) 05/20/2017   Encephalopathy 12/04/2017   ESRD (end stage renal disease) on hemodyalisis ([place])    Fall at home, initial encounter 09/12/2020   HFrEF (heart failure with reduced ejection fraction) ([place])    a. 08/2020 Echo: EF 40-45%, glbo HK, mod conc LVH, GrII DD, Ao root 7m; b. 04/2021 Echo: EF 30-35%, glob HK, mild LVH, GrII DD, mod red RV fxn, nl PASP, sev dil LA, mild-mod MR, mod TR, mild-mod AoV sclerosis w/o stenosis.   Hidradenitis suppurativa    Hypertension    ICH (intracerebral hemorrhage) ([place]    Peritonitis ([place] 04/21/2017   Pyogenic arthritis of knee ([place] 02/04/2016   Sepsis ([place] 01/12/2018   Stroke ([facility]    Past Surgical History:  Procedure Laterality Date   A/V FISTULAGRAM Left 02/24/2021   Procedure: A/V FISTULAGRAM;  Surgeon: SKatha Cabal MD;  [place];  Service: Cardiovascular;  Laterality: Left;   ABDOMINAL SURGERY     AMPUTATION FINGER Left 06/2019   PR AMPUTATION LONG FINGER/THUMB+FLAPS [facility]   [facility]    [facility]   BELOW KNEE LEG AMPUTATION Right 08/2017   [facility]   COLONOSCOPY     COLONOSCOPY WITH PROPOFOL N/A 10/28/2020   Procedure: COLONOSCOPY WITH PROPOFOL;  Surgeon: VLin Landsman MD;  [place] [facility]  Service: Gastroenterology;  Laterality: N/A;   COLONOSCOPY WITH PROPOFOL N/A 11/21/2020   Procedure: COLONOSCOPY WITH PROPOFOL;  Surgeon: WLucilla Lame MD;  [facility];  Service: Endoscopy;  Laterality: N/A;   DIALYSIS/PERMA CATHETER INSERTION N/A 12/09/2017   Procedure: DIALYSIS/PERMA CATHETER INSERTION;  Surgeon: SKatha Cabal MD;  [facility];  Service: Cardiovascular;  Laterality: N/A;   DIALYSIS/PERMA CATHETER INSERTION N/A 12/12/2017   Procedure: DIALYSIS/PERMA CATHETER INSERTION;  Surgeon: DAlgernon Huxley MD;  [place];  Service: Cardiovascular;  Laterality: N/A;   DIALYSIS/PERMA CATHETER REMOVAL Left 12/09/2017   Procedure: DIALYSIS/PERMA CATHETER REMOVAL;  Surgeon: SKatha Cabal MD;  [place] [facility];  Service: Cardiovascular;  Laterality: Left;   ESOPHAGOGASTRODUODENOSCOPY (EGD) WITH PROPOFOL N/A 11/20/2020   Procedure: ESOPHAGOGASTRODUODENOSCOPY (EGD) WITH PROPOFOL;  Surgeon: WLucilla Lame MD;  Location: [facility];  Service: Endoscopy;  Laterality: N/A;   KNEE SURGERY Left 02/04/2016   [facility]   LEG AMPUTATION THROUGH LOWER TIBIA AND FIBULA Left 06/22/2018   [facility]   LOWER EXTREMITY ANGIOGRAPHY Right 08/08/2017  Procedure: Lower Extremity Angiography;  Surgeon: Algernon Huxley, MD;  Location: [place] [facility];  Service: Cardiovascular;  Laterality: Right;   LOWER EXTREMITY ANGIOGRAPHY Right 08/22/2017   Procedure: Lower Extremity Angiography;  Surgeon: Algernon Huxley, MD;  Location: [place] [facility];   Service: Cardiovascular;  Laterality: Right;   LOWER EXTREMITY INTERVENTION  08/08/2017   Procedure: LOWER EXTREMITY INTERVENTION;  Surgeon: Algernon Huxley, MD;  Location: [place] [facility];  Service: Cardiovascular;;   LOWER EXTREMITY INTERVENTION  08/22/2017   Procedure: LOWER EXTREMITY INTERVENTION;  Surgeon: Algernon Huxley, MD;  Location: [place] [facility];  Service: Cardiovascular;;    Allergies  Allergies  Allergen Reactions   Methotrexate Other (See Comments)    Blood count drops   Vancomycin Shortness Of Breath    Eyes watering, SOB, wheezing   Cefepime Other (See Comments)    Confusion    Tape     History of Present Illness    60 year old male with the above complex past medical history including HFrEF, end-stage renal disease on hemodialysis, stroke/intracranial hemorrhage, seizure disorder, transfusion dependent anemia of chronic disease, DVT, peripheral arterial disease status post bilateral BKA's, type 2 diabetes mellitus, question of vascular dementia, hypertension, hyperlipidemia, coronary calcifications on CT, and hydradenitis suppurativa.  Prior echo in November 2021 showed an EF of 40 to 45% with global hypokinesis, grade 2 diastolic dysfunction, and normal RV function.  More recently, he was admitted to [facility] in July 2022 with weakness and falls.  He was volume overload and required hemodialysis.  Echo showed an EF of 30 to 35% with global hypokinesis, mild LVH, grade 2 diastolic dysfunction, moderately reduced RV systolic function, enlarged RV cavity, severe left atrial enlargement, large left pleural effusion, mild to moderate MR, moderate TR, and mild to moderate aortic sclerosis without stenosis.  He was readmitted in August 2022 with recurrent hypoxic respiratory failure requiring BiPAP secondary to pneumonia, pleural effusion, and exacerbated a worsening anemia with hemoglobin of 5.8 requiring transfusion.  He required left-sided thoracentesis during  admission.  CT of the chest was negative for PE but did show coronary calcifications.  We were consulted during that admission related to his LV dysfunction on prior echo.  In the setting of transfusion dependent anemia with acute illness, he was not felt to be a candidate for further ischemic evaluation at the time.  He had another readmission in late August 2022 with respiratory failure and volume overload.  As it had been during prior admissions, troponin was minimally elevated at 38 with a flat trend.  He was seen in our office as an outpatient on September 15, at which time he was doing well.  Ischemic evaluation was discussed and ultimately patient and family preferred conservative therapy for the time being.  He was readmitted October 10 with altered mental status and hypoglycemia.  Chest x-ray showed pulmonary edema and possible right infiltrate and he was treated for aspiration pneumonia.  He was again anemic with a hemoglobin of 6.2 and required 1 unit of blood.  He had significant epistaxis and was seen by ENT and required packing in the right nare.  Epistaxis was felt to be secondary to poorly controlled hypertension and he was maintained on clonidine, hydralazine, amlodipine, and lisinopril.  [name] was last seen in cardiology clinic on November 8, at which time he was feeling well and was staying in a skilled nursing facility.  Blood pressure was elevated that day however, he had not  yet taken his morning medications.  It was noted that he was placed back on spironolactone though this has since been d/c'd.  We again discussed possibly pursuing ischemic evaluation and collectively agreed to hold off for the time being.  Since his last visit, [name] reports overall feeling well though he is very tired throughout the day.  His wife says that he does not sleep at night and therefore naps for most of the day, and he concurs.  He is very inactive, spending most of his day either in a wheelchair or  in bed.  He does go to dialysis 3 times a week and tolerates this well.  He is not participating in any activities at the skilled nursing facility.  He has not had any chest pain or dyspnea and denies palpitations, PND, orthopnea, dizziness, syncope, edema, or early satiety.  Home Medications    Current Outpatient Medications  Medication Sig Dispense Refill   acetaminophen (TYLENOL) 500 MG tablet Take 1,000 mg by mouth daily as needed for moderate pain or headache.      Alcohol Swabs PADS Use as directed to check blood sugar three times daily for insulin dependent type 2 diabetes. 100 each 12   amLODipine (NORVASC) 10 MG tablet Take 1 tablet (10 mg total) by mouth every evening.     atorvastatin (LIPITOR) 80 MG tablet TAKE 1 TABLET(80 MG) BY MOUTH DAILY 90 tablet 3   Blood Glucose Monitoring Suppl (ONE TOUCH ULTRA 2) w/Device KIT Use as directed to check blood sugar three times daily. E11.9 1 each 0   carvedilol (COREG) 25 MG tablet Take 1 tablet (25 mg total) by mouth 2 (two) times daily. 60 tablet 12   cloNIDine (CATAPRES) 0.2 MG tablet Take 0.5 tablets (0.1 mg total) by mouth 2 (two) times daily.     feeding supplement (ENSURE ENLIVE / ENSURE PLUS) LIQD Take 237 mLs by mouth 3 (three) times daily between meals. 237 mL 12   gabapentin (NEURONTIN) 100 MG capsule Take 1 capsule (100 mg total) by mouth at bedtime.     [name] PEN 40 MG/0.8ML PNKT Inject 40 mg into the skin once a week.     hydrALAZINE (APRESOLINE) 100 MG tablet Take 1 tablet (100 mg total) by mouth every 8 (eight) hours. 90 tablet 1   Insulin Aspart FlexPen (NOVOLOG) 100 UNIT/ML Inject 2-12 Units into the skin 4 (four) times daily -  before meals and at bedtime. Per sliding scale 200-249= 2 units 250-299= 4 units 300-349= 6 units 350-399= 8 units 400-449= 10 units  450-499= 12 units 500 or greater= Call MD     insulin glargine-yfgn (SEMGLEE) 100 UNIT/ML injection Inject 0.05 mLs (5 Units total) into the skin daily. 10 mL 11    levETIRAcetam (KEPPRA) 750 MG tablet Take 750 mg by mouth daily at 12 noon.     lisinopril (ZESTRIL) 40 MG tablet Take 40 mg by mouth daily.     Multiple Vitamin (THEREMS PO) Take 1 tablet by mouth daily. multivitamin     oxymetazoline (AFRIN) 0.05 % nasal spray Place 1 spray into both nostrils every 2 (two) hours as needed (breakthrough bleeding). 30 mL 0   sevelamer carbonate (RENVELA) 800 MG tablet TAKE 1 TABLET(800 MG) BY MOUTH THREE TIMES DAILY 270 tablet 0   spironolactone (ALDACTONE) 25 MG tablet Take 25 mg by mouth at bedtime.     traMADol (ULTRAM) 50 MG tablet Take 50 mg by mouth 2 (two) times daily as needed.  No current facility-administered medications for this visit.     Review of Systems    He has been tired and not sleeping well at night.  Naps frequently throughout the day.  He denies chest pain, dyspnea, palpitations, PND, orthopnea, dizziness, syncope, edema, or early satiety.  All other systems reviewed and are otherwise negative except as noted above.    Physical Exam    VS:  BP 134/61 (BP Location: Right Arm, Patient Position: Sitting, Cuff Size: Normal)   Pulse 65   Ht _0  (1.803 m)   Wt 135 lb 12.8 oz (61.6 kg) Comment: Wife stated 140.0lb  SpO2 93%   BMI 18.94 kg/m  , BMI Body mass index is 18.94 kg/m.     GEN: Thin, frail, in no acute distress. HEENT: normal. Neck: Supple, no JVD, carotid bruits, or masses. Cardiac: RRR, no murmurs, rubs, or gallops. No clubbing, cyanosis, edema.  Radials 2+ and equal bilaterally.  Bilateral BKA's.  Left upper extremity AV fistula with + bruit and thrill. Respiratory:  Respirations regular and unlabored, clear to auscultation bilaterally. GI: Soft, nontender, nondistended, BS + x 4. MS: no deformity or atrophy. Skin: warm and dry, no rash. Neuro:  Strength and sensation are intact. Psych: Flat affect.  Accessory Clinical Findings    ECG personally reviewed by me today -regular sinus rhythm, 65 nonspecific ST and  T changes- no acute changes.  Lab Results  Component Value Date   WBC 9.3 08/02/2021   HGB 7.8 (L) 08/02/2021   HCT 25.4 (L) 08/02/2021   MCV 93.0 08/02/2021   PLT 168 08/02/2021   Lab Results  Component Value Date   CREATININE 4.40 (H) 08/02/2021   BUN 49 (H) 08/02/2021   NA 136 08/02/2021   K 4.6 08/02/2021   CL 97 (L) 08/02/2021   CO2 31 08/02/2021   Lab Results  Component Value Date   ALT 22 07/27/2021   AST 20 07/27/2021   ALKPHOS 94 07/27/2021   BILITOT 0.6 07/27/2021   Lab Results  Component Value Date   CHOL 109 09/13/2020   HDL 40 (L) 09/13/2020   LDLCALC 51 09/13/2020   TRIG 90 09/13/2020   CHOLHDL 2.7 09/13/2020    Lab Results  Component Value Date   HGBA1C 6.5 (H) 07/29/2021    Assessment & Plan    1.  Chronic heart failure with reduced ejection fraction/dilated cardiomyopathy: EF 30 to 35% by echo in July 2022 with grade 2 diastolic dysfunction.  Ischemic evaluation deferred at that time in the setting of comorbid illness including transfusion dependent anemia.  He is euvolemic on examination today and volume is largely managed by nephrology/hemodialysis.  Heart rate and blood pressure stable today.  We again discussed the possibility of ischemic evaluation and again have agreed to continue conservative therapy given his multiple comorbidities, lack of symptoms, and sedentary state.  He remains on beta-blocker, hydralazine, ACE inhibitor, and nitrate therapy.  2.  Essential hypertension: Blood pressure improved this visit at 134/61.  Continue current regimen-amlodipine, carvedilol, clonidine, hydralazine, lisinopril, and isosorbide.  3.  End-stage renal disease: He is tolerating Monday, Wednesday, Friday dialysis through [facility].  4.  Hyperlipidemia: LDL of 51 last November.  He is on atorvastatin 80 mg.  5.  Type 2 diabetes mellitus: A1c 6.5 in October.  Followed by primary care.  No new issues of hypoglycemia.  6.  Peripheral arterial disease: Status post  bilateral BKA's.  He is not on aspirin in the setting of chronic  anemia and epistaxis earlier in the fall.  LDL at goal.  7.  Transfusion dependent anemia: Labs followed closely at hemodialysis.  8.  Disposition: Follow-up in clinic in 3 months or sooner if necessary.  Murray Hodgkins, NP 09/24/2021, 12:09 PM

## 2021-09-24 NOTE — Patient Instructions (Signed)
Medication Instructions:  No changes at this time.   *If you need a refill on your cardiac medications before your next appointment, please call your pharmacy*   Lab Work: None  If you have labs (blood work) drawn today and your tests are completely normal, you will receive your results only by: MyChart Message (if you have MyChart) OR A paper copy in the mail If you have any lab test that is abnormal or we need to change your treatment, we will call you to review the results.   Testing/Procedures: None   Follow-Up: At [facility], you and your health needs are our priority.  As part of our continuing mission to provide you with exceptional heart care, we have created designated Provider Care Teams.  These Care Teams include your primary Cardiologist (physician) and Advanced Practice Providers (APPs -  Physician Assistants and Nurse Practitioners) who all work together to provide you with the care you need, when you need it.   Your next appointment:   3 month(s)  The format for your next appointment:   In Person  Provider:   Christopher End, MD or Christopher Berge, NP  

## 2021-09-28 ENCOUNTER — Telehealth: Payer: Self-pay

## 2021-09-28 NOTE — Telephone Encounter (Signed)
Copied from [place] [id number]. Topic: General - Other >> Sep 28, 2021  2:19 PM Alanda Slim E wrote: Reason for CRM: Mrs. Ferrell called to check status of paper work that Dr. Caryn Section was completing / she wanted to know if it was faxed off yet / insurance paperwork // please advise

## 2021-09-29 NOTE — Telephone Encounter (Signed)
Advised Ms. Fenley the original copy is at the front desk for her to pick up.  I also secured emailed a copy to her email address [email or web address]. Copy has been made for Mr. Pettey chart.    Thanks,   -Mickel Baas

## 2021-10-08 ENCOUNTER — Ambulatory Visit
Admission: RE | Admit: 2021-10-08 | Discharge: 2021-10-08 | Disposition: A | Discharge status: Home / Self Care | Payer: Medicare Other | Source: Ambulatory Visit | Attending: Nephrology | Admitting: Nephrology

## 2021-10-08 ENCOUNTER — Other Ambulatory Visit: Payer: Self-pay

## 2021-10-08 VITALS — BP 144/62 | HR 64 | Temp 99.2°F | Resp 16

## 2021-10-08 DIAGNOSIS — Z79899 Other long term (current) drug therapy: Secondary | ICD-10-CM | POA: Insufficient documentation

## 2021-10-08 DIAGNOSIS — D631 Anemia in chronic kidney disease: Secondary | ICD-10-CM | POA: Diagnosis not present

## 2021-10-08 DIAGNOSIS — N189 Chronic kidney disease, unspecified: Secondary | ICD-10-CM | POA: Insufficient documentation

## 2021-10-08 DIAGNOSIS — N186 End stage renal disease: Secondary | ICD-10-CM

## 2021-10-08 LAB — HEMOGLOBIN: Hemoglobin: 6.2 g/dL — ABNORMAL LOW (ref 13.0–17.0)

## 2021-10-08 LAB — PREPARE RBC (CROSSMATCH)

## 2021-10-08 LAB — GLUCOSE, CAPILLARY: Glucose-Capillary: 158 mg/dL — ABNORMAL HIGH (ref 70–99)

## 2021-10-08 NOTE — Progress Notes (Signed)
CBG 158  

## 2021-10-09 LAB — TYPE AND SCREEN
ABO/RH(D): B POS
Antibody Screen: NEGATIVE
Unit division: 0

## 2021-10-09 LAB — BPAM RBC
Blood Product Expiration Date: 202301122359
ISSUE DATE / TIME: 202212221105
Unit Type and Rh: 7300

## 2021-10-26 ENCOUNTER — Telehealth: Payer: Self-pay

## 2021-10-26 NOTE — Telephone Encounter (Signed)
Form is being sent back from [facility] requesting medical documentation confirming the definition of Advanced Dementia for Carmen's critical illness claim.  Fax to [phone]

## 2021-10-28 ENCOUNTER — Other Ambulatory Visit
Admission: RE | Admit: 2021-10-28 | Discharge: 2021-10-28 | Disposition: A | Discharge status: Home / Self Care | Payer: Medicare Other | Source: Other Acute Inpatient Hospital | Attending: Nephrology | Admitting: Nephrology

## 2021-10-28 LAB — HEMOGLOBIN: Hemoglobin: 6.2 g/dL — ABNORMAL LOW (ref 13.0–17.0)

## 2021-10-29 ENCOUNTER — Other Ambulatory Visit: Payer: Self-pay

## 2021-10-29 ENCOUNTER — Ambulatory Visit
Admission: RE | Admit: 2021-10-29 | Discharge: 2021-10-29 | Disposition: A | Discharge status: Home / Self Care | Payer: Medicare Other | Source: Ambulatory Visit | Attending: Family Medicine | Admitting: Family Medicine

## 2021-10-29 VITALS — BP 153/60 | HR 72 | Temp 98.6°F | Resp 18

## 2021-10-29 DIAGNOSIS — D631 Anemia in chronic kidney disease: Secondary | ICD-10-CM

## 2021-10-29 DIAGNOSIS — N186 End stage renal disease: Secondary | ICD-10-CM | POA: Insufficient documentation

## 2021-10-29 LAB — PREPARE RBC (CROSSMATCH)

## 2021-10-29 LAB — HEMOGLOBIN: Hemoglobin: 7.3 g/dL — ABNORMAL LOW (ref 13.0–17.0)

## 2021-10-29 NOTE — Progress Notes (Signed)
This RN informed ordering MD of Hemoglobin increased from 6.2 to 7.3 after first unit of PRBC.  Verbal ordered received to stop at one unit at this time.  Patient to be discharged from Medical Day.

## 2021-10-30 LAB — TYPE AND SCREEN
ABO/RH(D): B POS
Antibody Screen: NEGATIVE
Unit division: 0

## 2021-10-30 LAB — BPAM RBC
Blood Product Expiration Date: 202301252359
ISSUE DATE / TIME: 202301120916
Unit Type and Rh: 7300

## 2021-11-03 ENCOUNTER — Ambulatory Visit: Payer: Self-pay

## 2021-11-03 DIAGNOSIS — F02B0 Dementia in other diseases classified elsewhere, moderate, without behavioral disturbance, psychotic disturbance, mood disturbance, and anxiety: Secondary | ICD-10-CM

## 2021-11-03 DIAGNOSIS — Z992 Dependence on renal dialysis: Secondary | ICD-10-CM

## 2021-11-03 DIAGNOSIS — N186 End stage renal disease: Secondary | ICD-10-CM

## 2021-11-03 NOTE — Patient Instructions (Signed)
Thank you for allowing the Chronic Care Management team to participate in your care.  Please don't hesitate to contact the clinic if our team can be of further assistance.

## 2021-11-03 NOTE — Chronic Care Management (AMB) (Signed)
Care Management    RN Visit Note  11/03/2021 Name: [name] MRN: [id number] DOB: 10/26/1960  Subjective: [name] is a 60 y.o. year old male who is a primary care patient of [name], [name], MD. The care management team was consulted for assistance with disease management and care coordination needs.    Engaged with patient's spouse by telephone for follow up visit in response to provider referral for case management and care coordination services.   Consent to Services:   [name] was given information about Care Management services including:  Care Management services includes personalized support from designated clinical staff supervised by his physician, including individualized plan of care and coordination with other care providers 24/7 contact phone numbers for assistance for urgent and routine care needs. The patient may stop case management services at any time by phone call to the office staff.  Patient and spouse agreed to services. Verbal consent obtained.   Assessment: Review of patient past medical history, allergies, medications, health status, including review of consultants reports, laboratory and other test data, was performed as part of comprehensive evaluation and provision of chronic care management services.   SDOH (Social Determinants of Health) assessments and interventions performed: No  Care Plan  Allergies  Allergen Reactions   Methotrexate Other (See Comments)    Blood count drops   Vancomycin Shortness Of Breath    Eyes watering, SOB, wheezing   Cefepime Other (See Comments)    Confusion    Tape     Outpatient Encounter Medications as of 11/03/2021  Medication Sig   acetaminophen (TYLENOL) 500 MG tablet Take 1,000 mg by mouth daily as needed for moderate pain or headache.   Alcohol Swabs PADS Use as directed to check blood sugar three times daily for insulin dependent type 2 diabetes.   amLODipine (NORVASC) 10 MG tablet Take 1  tablet (10 mg total) by mouth every evening.   atorvastatin (LIPITOR) 80 MG tablet TAKE 1 TABLET(80 MG) BY MOUTH DAILY   Blood Glucose Monitoring Suppl (ONE TOUCH ULTRA 2) w/Device KIT Use as directed to check blood sugar three times daily. E11.9   carvedilol (COREG) 25 MG tablet Take 1 tablet (25 mg total) by mouth 2 (two) times daily.   cloNIDine (CATAPRES) 0.2 MG tablet Take 0.5 tablets (0.1 mg total) by mouth 2 (two) times daily.   feeding supplement (ENSURE ENLIVE / ENSURE PLUS) LIQD Take 237 mLs by mouth 3 (three) times daily between meals. (Patient not taking: Reported on 09/24/2021)   gabapentin (NEURONTIN) 100 MG capsule Take 1 capsule (100 mg total) by mouth at bedtime.   [name] PEN 40 MG/0.8ML PNKT Inject 40 mg into the skin once a week.   hydrALAZINE (APRESOLINE) 100 MG tablet Take 1 tablet (100 mg total) by mouth every 8 (eight) hours.   Insulin Aspart FlexPen (NOVOLOG) 100 UNIT/ML Inject 2-12 Units into the skin 4 (four) times daily -  before meals and at bedtime. Per sliding scale 200-249= 2 units 250-299= 4 units 300-349= 6 units 350-399= 8 units 400-449= 10 units  450-499= 12 units 500 or greater= Call MD   insulin glargine-yfgn (SEMGLEE) 100 UNIT/ML injection Inject 0.05 mLs (5 Units total) into the skin daily.   isosorbide mononitrate (IMDUR) 30 MG 24 hr tablet Take 30 mg by mouth daily.   levETIRAcetam (KEPPRA) 750 MG tablet Take 750 mg by mouth daily at 12 noon.   lisinopril (ZESTRIL) 40 MG tablet Take 40 mg by mouth  daily.   Multiple Vitamin (THEREMS PO) Take 1 tablet by mouth daily. multivitamin   oxymetazoline (AFRIN) 0.05 % nasal spray Place 1 spray into both nostrils every 2 (two) hours as needed (breakthrough bleeding). (Patient not taking: Reported on 09/24/2021)   sevelamer carbonate (RENVELA) 800 MG tablet TAKE 1 TABLET(800 MG) BY MOUTH THREE TIMES DAILY   spironolactone (ALDACTONE) 25 MG tablet Take 25 mg by mouth at bedtime. (Patient not taking: Reported on  09/24/2021)   traMADol (ULTRAM) 50 MG tablet Take 50 mg by mouth 2 (two) times daily as needed.   No facility-administered encounter medications on file as of 11/03/2021.    Patient Active Problem List   Diagnosis Date Noted   Type II diabetes mellitus with renal manifestations ([place]) 07/27/2021   Anemia in ESRD (end-stage renal disease) ([place]) 07/27/2021   Hypoglycemia 07/27/2021   Hypothermia 07/27/2021   COVID-19 virus infection 07/27/2021   Aspiration pneumonia ([place]) 07/27/2021   Unstageable pressure ulcer of sacral region ([place]) 05/22/2021   Acute decompensated heart failure ([place]) 05/19/2021   Acute respiratory failure with hypoxia ([place]) 05/18/2021   Protein-calorie malnutrition, severe 05/14/2021   Acute pulmonary edema ([facility])    Shortness of breath    Fluid overload 05/12/2021   Cellulitis and abscess of buttock    History of GI bleed 02/06/2021   Long term current use of immunosuppressive drug 02/06/2021   Disorder of skin due to Crohn's disease ([place]) 02/54/2706   Complication of vascular access for dialysis 01/22/2021   Polyp of transverse colon    Acute metabolic encephalopathy 23/76/2831   Acute on chronic anemia 51/76/1607   Chronic systolic CHF (congestive heart failure) ([place]) 11/18/2020   Cerebrovascular disease 09/12/2020   Transient alteration of awareness 09/12/2020   Hyperkalemia 09/12/2020   Mild cognitive impairment 09/30/2019   Peripheral vascular disease ([place]) 06/28/2018   Sepsis ([place]) 01/12/2018   Pressure injury of skin 12/04/2017   S/P bilateral BKA (below knee amputation) ([place]) 10/20/2017   Atherosclerosis of native arteries of extremity with rest pain ([place]) 08/05/2017   History of CVA (cerebrovascular accident) 04/15/2017   Seizure disorder ([place]) 04/15/2017   End stage renal failure on dialysis ([place]) 04/12/2016   Aphthae 02/20/2016   Hidradenitis suppurativa 02/20/2016   Leg pain 02/20/2016   Neuropathy 02/20/2016   Narrowing of intervertebral disc  space 08/29/2015   Vascular disorder of lower extremity 08/29/2015   Failure of erection 08/29/2015   Hypercholesteremia 08/29/2015   Hypertension 08/29/2015   Anemia due to chronic kidney disease 05/26/2015   Venous insufficiency of leg 09/04/2014   History of deep vein thrombosis (DVT) of lower extremity 08/16/2014   Prostatic intraepithelial neoplasia 11/02/2013   Elevated prostate specific antigen (PSA) 09/11/2013   Benign prostatic hyperplasia with urinary obstruction 08/13/2013   Spermatocele 08/13/2013   Avitaminosis D 01/25/2013   Type 2 diabetes mellitus with hypoglycemia without coma ([place]) 03/28/2012   Crohn disease ([place]) 08/03/2011    Patient Care Plan: RN Care Management Plan of Care     Problem Identified: Goals of Care      Long-Range Goal: Disease Progression Prevented or Minimized   Priority: High  Note:   Current Barriers:  Care Coordination needs related to Level of care concerns Patient unable to complete ADLs independently Patient unable to complete IADLs independently  RNCM Clinical Goal(s):  Patient will work with the [employer] social worker and Systems analyst regarding patient's skilled nursing needs.  Interventions: 1:1 collaboration with primary care provider regarding development  and update of comprehensive plan of care as evidenced by provider attestation and co-signature Inter-disciplinary care team collaboration (see longitudinal plan of care) Evaluation of current treatment plan related to  self management and patient's adherence to plan as established by provider   Interdisciplinary Collaboration Interventions:   Discussed plan for long term care. Spouse reports [name] condition continues to decline. She does not anticipate him returning to the home. He is currently residing at [facility]. Reports submitting his Medicaid Application and pending follow-up regarding approval. Spouse reports being able to  visit the skilled nursing facility frequently. Also reports family members are available. Patient continues to have strong family support. Active Listening/Emotional Support Spouse advised to continue communicating with the [employer] Social Worker to address concerns regarding patient's declining functional status.  Advised to contact the clinic with questions and concerns as needed.  Patient Goals/Self-Care Activities: Continue working with the [employer] to ensure safety and clinical needs are addressed.          PLAN:  [name] spouse will call or contact the clinic for assistance if needed.   Cristy [facility] Care Management [facility] [phone]

## 2021-11-05 ENCOUNTER — Telehealth: Payer: Self-pay

## 2021-11-05 NOTE — Telephone Encounter (Signed)
Copied from [place] [id number]. Topic: General - Other >> Nov 05, 2021  3:45 PM McGill, Nelva Bush wrote: Reason for CRM: Pt's wife requested a call back regarding paperwork she dropped off for pt more than a week ago. Pt's wife declined to provide any further information.

## 2021-11-06 ENCOUNTER — Other Ambulatory Visit: Payer: Self-pay

## 2021-11-06 ENCOUNTER — Emergency Department: Payer: Medicare Other

## 2021-11-06 ENCOUNTER — Inpatient Hospital Stay
Admission: EM | Admit: 2021-11-06 | Discharge: 2021-11-12 | DRG: 871 | Disposition: A | Discharge status: Skilled Nursing Facility | Payer: Medicare Other | Source: Skilled Nursing Facility | Attending: Internal Medicine | Admitting: Internal Medicine

## 2021-11-06 ENCOUNTER — Encounter: Payer: Self-pay | Admitting: Emergency Medicine

## 2021-11-06 DIAGNOSIS — I429 Cardiomyopathy, unspecified: Secondary | ICD-10-CM | POA: Diagnosis present

## 2021-11-06 DIAGNOSIS — N186 End stage renal disease: Secondary | ICD-10-CM | POA: Diagnosis present

## 2021-11-06 DIAGNOSIS — E1122 Type 2 diabetes mellitus with diabetic chronic kidney disease: Secondary | ICD-10-CM | POA: Diagnosis present

## 2021-11-06 DIAGNOSIS — I10 Essential (primary) hypertension: Secondary | ICD-10-CM

## 2021-11-06 DIAGNOSIS — A419 Sepsis, unspecified organism: Secondary | ICD-10-CM

## 2021-11-06 DIAGNOSIS — Z20822 Contact with and (suspected) exposure to covid-19: Secondary | ICD-10-CM | POA: Diagnosis present

## 2021-11-06 DIAGNOSIS — Z8261 Family history of arthritis: Secondary | ICD-10-CM

## 2021-11-06 DIAGNOSIS — G9341 Metabolic encephalopathy: Secondary | ICD-10-CM | POA: Diagnosis present

## 2021-11-06 DIAGNOSIS — Z8673 Personal history of transient ischemic attack (TIA), and cerebral infarction without residual deficits: Secondary | ICD-10-CM

## 2021-11-06 DIAGNOSIS — D696 Thrombocytopenia, unspecified: Secondary | ICD-10-CM | POA: Diagnosis not present

## 2021-11-06 DIAGNOSIS — I2510 Atherosclerotic heart disease of native coronary artery without angina pectoris: Secondary | ICD-10-CM | POA: Diagnosis present

## 2021-11-06 DIAGNOSIS — E1151 Type 2 diabetes mellitus with diabetic peripheral angiopathy without gangrene: Secondary | ICD-10-CM | POA: Diagnosis present

## 2021-11-06 DIAGNOSIS — Z89512 Acquired absence of left leg below knee: Secondary | ICD-10-CM

## 2021-11-06 DIAGNOSIS — L89152 Pressure ulcer of sacral region, stage 2: Secondary | ICD-10-CM | POA: Diagnosis present

## 2021-11-06 DIAGNOSIS — E785 Hyperlipidemia, unspecified: Secondary | ICD-10-CM | POA: Diagnosis present

## 2021-11-06 DIAGNOSIS — I5043 Acute on chronic combined systolic (congestive) and diastolic (congestive) heart failure: Secondary | ICD-10-CM | POA: Diagnosis present

## 2021-11-06 DIAGNOSIS — D631 Anemia in chronic kidney disease: Secondary | ICD-10-CM

## 2021-11-06 DIAGNOSIS — L732 Hidradenitis suppurativa: Secondary | ICD-10-CM | POA: Diagnosis not present

## 2021-11-06 DIAGNOSIS — L03317 Cellulitis of buttock: Secondary | ICD-10-CM | POA: Diagnosis present

## 2021-11-06 DIAGNOSIS — N2581 Secondary hyperparathyroidism of renal origin: Secondary | ICD-10-CM | POA: Diagnosis present

## 2021-11-06 DIAGNOSIS — L0231 Cutaneous abscess of buttock: Secondary | ICD-10-CM | POA: Diagnosis present

## 2021-11-06 DIAGNOSIS — Z8249 Family history of ischemic heart disease and other diseases of the circulatory system: Secondary | ICD-10-CM

## 2021-11-06 DIAGNOSIS — F0390 Unspecified dementia without behavioral disturbance: Secondary | ICD-10-CM | POA: Diagnosis present

## 2021-11-06 DIAGNOSIS — Z87891 Personal history of nicotine dependence: Secondary | ICD-10-CM

## 2021-11-06 DIAGNOSIS — A4189 Other specified sepsis: Principal | ICD-10-CM | POA: Diagnosis present

## 2021-11-06 DIAGNOSIS — Z794 Long term (current) use of insulin: Secondary | ICD-10-CM

## 2021-11-06 DIAGNOSIS — I132 Hypertensive heart and chronic kidney disease with heart failure and with stage 5 chronic kidney disease, or end stage renal disease: Secondary | ICD-10-CM | POA: Diagnosis present

## 2021-11-06 DIAGNOSIS — G40909 Epilepsy, unspecified, not intractable, without status epilepticus: Secondary | ICD-10-CM | POA: Diagnosis present

## 2021-11-06 DIAGNOSIS — Z833 Family history of diabetes mellitus: Secondary | ICD-10-CM

## 2021-11-06 DIAGNOSIS — J189 Pneumonia, unspecified organism: Secondary | ICD-10-CM | POA: Diagnosis present

## 2021-11-06 DIAGNOSIS — J9601 Acute respiratory failure with hypoxia: Secondary | ICD-10-CM | POA: Diagnosis not present

## 2021-11-06 DIAGNOSIS — R0902 Hypoxemia: Secondary | ICD-10-CM

## 2021-11-06 DIAGNOSIS — Z86718 Personal history of other venous thrombosis and embolism: Secondary | ICD-10-CM

## 2021-11-06 DIAGNOSIS — E11649 Type 2 diabetes mellitus with hypoglycemia without coma: Secondary | ICD-10-CM | POA: Diagnosis present

## 2021-11-06 DIAGNOSIS — I5022 Chronic systolic (congestive) heart failure: Secondary | ICD-10-CM | POA: Diagnosis present

## 2021-11-06 DIAGNOSIS — E1140 Type 2 diabetes mellitus with diabetic neuropathy, unspecified: Secondary | ICD-10-CM | POA: Diagnosis present

## 2021-11-06 DIAGNOSIS — K5090 Crohn's disease, unspecified, without complications: Secondary | ICD-10-CM | POA: Diagnosis present

## 2021-11-06 DIAGNOSIS — L89891 Pressure ulcer of other site, stage 1: Secondary | ICD-10-CM | POA: Diagnosis present

## 2021-11-06 DIAGNOSIS — Z79899 Other long term (current) drug therapy: Secondary | ICD-10-CM

## 2021-11-06 DIAGNOSIS — Z89511 Acquired absence of right leg below knee: Secondary | ICD-10-CM

## 2021-11-06 DIAGNOSIS — Z992 Dependence on renal dialysis: Secondary | ICD-10-CM

## 2021-11-06 LAB — CBC WITH DIFFERENTIAL/PLATELET
Abs Immature Granulocytes: 0.54 10*3/uL — ABNORMAL HIGH (ref 0.00–0.07)
Basophils Absolute: 0 10*3/uL (ref 0.0–0.1)
Basophils Relative: 0 %
Eosinophils Absolute: 0 10*3/uL (ref 0.0–0.5)
Eosinophils Relative: 0 %
HCT: 24.8 % — ABNORMAL LOW (ref 39.0–52.0)
Hemoglobin: 7.5 g/dL — ABNORMAL LOW (ref 13.0–17.0)
Immature Granulocytes: 3 %
Lymphocytes Relative: 2 %
Lymphs Abs: 0.5 10*3/uL — ABNORMAL LOW (ref 0.7–4.0)
MCH: 27.8 pg (ref 26.0–34.0)
MCHC: 30.2 g/dL (ref 30.0–36.0)
MCV: 91.9 fL (ref 80.0–100.0)
Monocytes Absolute: 0.3 10*3/uL (ref 0.1–1.0)
Monocytes Relative: 2 %
Neutro Abs: 18.4 10*3/uL — ABNORMAL HIGH (ref 1.7–7.7)
Neutrophils Relative %: 93 %
Platelets: 205 10*3/uL (ref 150–400)
RBC: 2.7 MIL/uL — ABNORMAL LOW (ref 4.22–5.81)
RDW: 16.9 % — ABNORMAL HIGH (ref 11.5–15.5)
WBC: 19.8 10*3/uL — ABNORMAL HIGH (ref 4.0–10.5)
nRBC: 0 % (ref 0.0–0.2)

## 2021-11-06 LAB — CBG MONITORING, ED: Glucose-Capillary: 136 mg/dL — ABNORMAL HIGH (ref 70–99)

## 2021-11-06 LAB — COMPREHENSIVE METABOLIC PANEL
ALT: 9 U/L (ref 0–44)
AST: 15 U/L (ref 15–41)
Albumin: 2.2 g/dL — ABNORMAL LOW (ref 3.5–5.0)
Alkaline Phosphatase: 80 U/L (ref 38–126)
Anion gap: 11 (ref 5–15)
BUN: 43 mg/dL — ABNORMAL HIGH (ref 6–20)
CO2: 29 mmol/L (ref 22–32)
Calcium: 9.2 mg/dL (ref 8.9–10.3)
Chloride: 94 mmol/L — ABNORMAL LOW (ref 98–111)
Creatinine, Ser: 4.16 mg/dL — ABNORMAL HIGH (ref 0.61–1.24)
GFR, Estimated: 16 mL/min — ABNORMAL LOW (ref >60)
Glucose, Bld: 132 mg/dL — ABNORMAL HIGH (ref 70–99)
Potassium: 4.3 mmol/L (ref 3.5–5.1)
Sodium: 134 mmol/L — ABNORMAL LOW (ref 135–145)
Total Bilirubin: 0.2 mg/dL — ABNORMAL LOW (ref 0.3–1.2)
Total Protein: 7.1 g/dL (ref 6.5–8.1)

## 2021-11-06 LAB — RESP PANEL BY RT-PCR (FLU A&B, COVID) ARPGX2
Influenza A by PCR: NEGATIVE
Influenza B by PCR: NEGATIVE
SARS Coronavirus 2 by RT PCR: NEGATIVE

## 2021-11-06 LAB — HEPATITIS B SURFACE ANTIGEN: Hepatitis B Surface Ag: NONREACTIVE

## 2021-11-06 LAB — LACTIC ACID, PLASMA: Lactic Acid, Venous: 0.8 mmol/L (ref 0.5–1.9)

## 2021-11-06 LAB — GLUCOSE, CAPILLARY: Glucose-Capillary: 150 mg/dL — ABNORMAL HIGH (ref 70–99)

## 2021-11-06 MED ORDER — SEVELAMER CARBONATE 800 MG PO TABS
800.0000 mg | ORAL_TABLET | Freq: Three times a day (TID) | ORAL | Status: DC
  Administered 2021-11-07 – 2021-11-12 (×16): 800 mg via ORAL
  Filled 2021-11-06 (×15): qty 1

## 2021-11-06 MED ORDER — THEREMS PO TABS: ORAL_TABLET | Freq: Every day | ORAL | Status: DC

## 2021-11-06 MED ORDER — ATORVASTATIN CALCIUM 20 MG PO TABS
80.0000 mg | ORAL_TABLET | Freq: Every day | ORAL | Status: DC
  Administered 2021-11-07 – 2021-11-12 (×6): 80 mg via ORAL
  Filled 2021-11-06 (×6): qty 4

## 2021-11-06 MED ORDER — LEVETIRACETAM 750 MG PO TABS
750.0000 mg | ORAL_TABLET | Freq: Every day | ORAL | Status: DC
  Administered 2021-11-06 – 2021-11-12 (×7): 750 mg via ORAL
  Filled 2021-11-06 (×7): qty 1

## 2021-11-06 MED ORDER — IOHEXOL 300 MG/ML  SOLN
75.0000 mL | Freq: Once | INTRAMUSCULAR | Status: AC | PRN
  Administered 2021-11-06: 75 mL via INTRAVENOUS

## 2021-11-06 MED ORDER — SODIUM CHLORIDE 0.9 % IV SOLN: 100.0000 mL | INTRAVENOUS | Status: DC | PRN

## 2021-11-06 MED ORDER — GABAPENTIN 100 MG PO CAPS
100.0000 mg | ORAL_CAPSULE | Freq: Every day | ORAL | Status: DC
  Administered 2021-11-06 – 2021-11-11 (×6): 100 mg via ORAL
  Filled 2021-11-06 (×6): qty 1

## 2021-11-06 MED ORDER — HEPARIN SODIUM (PORCINE) 1000 UNIT/ML DIALYSIS
1000.0000 [IU] | INTRAMUSCULAR | Status: DC | PRN
  Filled 2021-11-06: qty 1

## 2021-11-06 MED ORDER — SODIUM CHLORIDE 0.9 % IV SOLN
100.0000 mg | Freq: Once | INTRAVENOUS | Status: AC
  Administered 2021-11-06: 100 mg via INTRAVENOUS
  Filled 2021-11-06 (×2): qty 100

## 2021-11-06 MED ORDER — VANCOMYCIN HCL IN DEXTROSE 1-5 GM/200ML-% IV SOLN: 1000.0000 mg | Freq: Once | INTRAVENOUS | Status: DC

## 2021-11-06 MED ORDER — SODIUM CHLORIDE 0.9 % IV SOLN
100.0000 mg | Freq: Two times a day (BID) | INTRAVENOUS | Status: DC
  Administered 2021-11-07 – 2021-11-09 (×5): 100 mg via INTRAVENOUS
  Filled 2021-11-06 (×7): qty 100

## 2021-11-06 MED ORDER — SODIUM CHLORIDE 0.9 % IV SOLN: 2.0000 g | Freq: Once | INTRAVENOUS | Status: DC

## 2021-11-06 MED ORDER — HYDRALAZINE HCL 50 MG PO TABS
100.0000 mg | ORAL_TABLET | Freq: Three times a day (TID) | ORAL | Status: DC
  Administered 2021-11-06 – 2021-11-12 (×15): 100 mg via ORAL
  Filled 2021-11-06 (×15): qty 2

## 2021-11-06 MED ORDER — TRAMADOL HCL 50 MG PO TABS
50.0000 mg | ORAL_TABLET | Freq: Two times a day (BID) | ORAL | Status: DC | PRN
  Administered 2021-11-06 – 2021-11-12 (×8): 50 mg via ORAL
  Filled 2021-11-06 (×8): qty 1

## 2021-11-06 MED ORDER — RENA-VITE PO TABS
1.0000 | ORAL_TABLET | Freq: Every day | ORAL | Status: DC
  Administered 2021-11-06 – 2021-11-11 (×6): 1 via ORAL
  Filled 2021-11-06 (×6): qty 1

## 2021-11-06 MED ORDER — SODIUM CHLORIDE 0.9 % IV BOLUS (SEPSIS)
1000.0000 mL | Freq: Once | INTRAVENOUS | Status: AC
  Administered 2021-11-06: 1000 mL via INTRAVENOUS

## 2021-11-06 MED ORDER — LIDOCAINE-PRILOCAINE 2.5-2.5 % EX CREA: 1.0000 "application " | TOPICAL_CREAM | CUTANEOUS | Status: DC | PRN

## 2021-11-06 MED ORDER — AMLODIPINE BESYLATE 10 MG PO TABS
10.0000 mg | ORAL_TABLET | Freq: Every evening | ORAL | Status: DC
  Administered 2021-11-06 – 2021-11-11 (×6): 10 mg via ORAL
  Filled 2021-11-06 (×7): qty 1

## 2021-11-06 MED ORDER — CARVEDILOL 25 MG PO TABS
25.0000 mg | ORAL_TABLET | Freq: Two times a day (BID) | ORAL | Status: DC
  Administered 2021-11-06 – 2021-11-12 (×11): 25 mg via ORAL
  Filled 2021-11-06 (×13): qty 1

## 2021-11-06 MED ORDER — ENSURE ENLIVE PO LIQD
237.0000 mL | Freq: Three times a day (TID) | ORAL | Status: DC
  Administered 2021-11-07 – 2021-11-12 (×12): 237 mL via ORAL

## 2021-11-06 MED ORDER — ALTEPLASE 2 MG IJ SOLR
2.0000 mg | Freq: Once | INTRAMUSCULAR | Status: DC | PRN
  Filled 2021-11-06: qty 2

## 2021-11-06 MED ORDER — CHLORHEXIDINE GLUCONATE CLOTH 2 % EX PADS
6.0000 | MEDICATED_PAD | Freq: Every day | CUTANEOUS | Status: DC
  Administered 2021-11-08: 6 via TOPICAL
  Filled 2021-11-06: qty 6

## 2021-11-06 MED ORDER — ACETAMINOPHEN 325 MG PO TABS
650.0000 mg | ORAL_TABLET | Freq: Once | ORAL | Status: AC
  Administered 2021-11-06: 650 mg via ORAL

## 2021-11-06 MED ORDER — METRONIDAZOLE 500 MG/100ML IV SOLN
500.0000 mg | Freq: Once | INTRAVENOUS | Status: AC
  Administered 2021-11-06: 500 mg via INTRAVENOUS
  Filled 2021-11-06: qty 100

## 2021-11-06 MED ORDER — PENTAFLUOROPROP-TETRAFLUOROETH EX AERO
1.0000 "application " | INHALATION_SPRAY | CUTANEOUS | Status: DC | PRN
  Filled 2021-11-06: qty 30

## 2021-11-06 MED ORDER — ISOSORBIDE MONONITRATE ER 30 MG PO TB24
30.0000 mg | ORAL_TABLET | Freq: Every day | ORAL | Status: DC
  Administered 2021-11-07 – 2021-11-12 (×5): 30 mg via ORAL
  Filled 2021-11-06 (×5): qty 1

## 2021-11-06 MED ORDER — ONDANSETRON HCL 4 MG PO TABS: 4.0000 mg | ORAL_TABLET | Freq: Four times a day (QID) | ORAL | Status: DC | PRN

## 2021-11-06 MED ORDER — SODIUM CHLORIDE 0.9 % IV SOLN
1.0000 g | Freq: Once | INTRAVENOUS | Status: DC
  Filled 2021-11-06: qty 1

## 2021-11-06 MED ORDER — ONDANSETRON HCL 4 MG/2ML IJ SOLN: 4.0000 mg | Freq: Four times a day (QID) | INTRAMUSCULAR | Status: DC | PRN

## 2021-11-06 MED ORDER — ACETAMINOPHEN 325 MG PO TABS
ORAL_TABLET | ORAL | Status: AC
  Filled 2021-11-06: qty 2

## 2021-11-06 MED ORDER — ENSURE ENLIVE PO LIQD
237.0000 mL | Freq: Three times a day (TID) | ORAL | Status: DC
  Administered 2021-11-06 – 2021-11-09 (×7): 237 mL via ORAL

## 2021-11-06 MED ORDER — LACTATED RINGERS IV SOLN: INTRAVENOUS | Status: DC

## 2021-11-06 MED ORDER — LIDOCAINE HCL (PF) 1 % IJ SOLN
5.0000 mL | INTRAMUSCULAR | Status: DC | PRN
  Filled 2021-11-06: qty 5

## 2021-11-06 MED ORDER — ACETAMINOPHEN 500 MG PO TABS
1000.0000 mg | ORAL_TABLET | Freq: Every day | ORAL | Status: DC | PRN
  Administered 2021-11-07 – 2021-11-11 (×3): 1000 mg via ORAL
  Filled 2021-11-06 (×4): qty 2

## 2021-11-06 MED ORDER — LISINOPRIL 20 MG PO TABS
40.0000 mg | ORAL_TABLET | Freq: Every day | ORAL | Status: DC
  Administered 2021-11-07 – 2021-11-12 (×5): 40 mg via ORAL
  Filled 2021-11-06 (×7): qty 2

## 2021-11-06 MED ORDER — SODIUM CHLORIDE 0.9 % IV SOLN: INTRAVENOUS | Status: DC | PRN

## 2021-11-06 NOTE — Progress Notes (Signed)
Antibiotic delayed due to patient refused Cefepime.

## 2021-11-06 NOTE — ED Notes (Signed)
Patient transported to X-ray 

## 2021-11-06 NOTE — ED Notes (Signed)
Patient denies pain and is resting comfortably.  

## 2021-11-06 NOTE — ED Triage Notes (Addendum)
Arrives from [facility].  Per EMS report, CBG this morning was 39.  Patient was found unresponsive this morning.  Staff worked for an hour to give him oral glucose.  On EMS arrival CBG:  115.  146/66 2l/ [place] -- 96%  Dialysis M-W-F  -- has not been to dialysis yet today .  20g RFA  Patient is awake and alert, following commands.  Non verbal.  No words to questions in triage, just a grunt.

## 2021-11-06 NOTE — ED Notes (Signed)
Dialysis made aware patient has ready room 210 and can go there after dialysis is complete

## 2021-11-06 NOTE — Progress Notes (Signed)
[facility]  ROUNDING NOTE   Subjective:   [name] is a 61 y.o. male with a past medical history of anemia, diabetes, hypertension, stroke, bilateral BKA, chronic systolic heart failure, and end stage renal disease on dialysis. Patient presents from [facility] with reported glucose of 39. He is also found to have increased WBCs on labs.   Patient known to our practice and receives outpatient dialysis treatments at [facility].  Patient receives dialysis on a MWF schedule, supervised by [facility] physicians.  Patient seen resting on stretcher, alert and oriented.  Wife at bedside.  Wife states patient was in normal state of health last night when she left.  Wife feels facility might of given too much insulin.  Wife states patient continues to have poor appetite and does not require the amount of insulin that is prescribed.  Patient currently denies nausea, vomiting, diarrhea.  Denies shortness of breath and cough.  Has maintained all outpatient dialysis treatments to date.  Labs on arrival include sodium 134 glucose 132, creatinine 4.16 with GFR 16, and WBC 19.8.   Objective:  Vital signs in last 24 hours:  Temp:  [95.1 F (35.1 C)] 95.1 F (35.1 C) (01/20 1145) Pulse Rate:  [55-56] 56 (01/20 1044) Resp:  [14-20] 20 (01/20 1044) BP: (142-145)/(58-63) 145/63 (01/20 1044) SpO2:  [93 %-95 %] 95 % (01/20 1044) Weight:  [61.6 kg] 61.6 kg (01/20 0958)  Weight change:  Filed Weights   11/06/21 0958  Weight: 61.6 kg    Intake/Output: No intake/output data recorded.   Intake/Output this shift:  No intake/output data recorded.  Physical Exam: General: NAD, resting quietly  Head: Normocephalic, atraumatic. Moist oral mucosal membranes  Eyes: Anicteric  Lungs:  Clear to auscultation, normal effort, room air  Heart: Regular rate and rhythm  Abdomen:  Soft, nontender  Extremities: No peripheral edema.  Neurologic: Nonfocal, moving all four extremities  Skin: No  lesions  Access: Left aVF    Basic Metabolic Panel: Recent Labs  Lab 11/06/21 1008  NA 134*  K 4.3  CL 94*  CO2 29  GLUCOSE 132*  BUN 43*  CREATININE 4.16*  CALCIUM 9.2    Liver Function Tests: Recent Labs  Lab 11/06/21 1008  AST 15  ALT 9  ALKPHOS 80  BILITOT 0.2*  PROT 7.1  ALBUMIN 2.2*   No results for input(s): LIPASE, AMYLASE in the last 168 hours. No results for input(s): AMMONIA in the last 168 hours.  CBC: Recent Labs  Lab 11/06/21 1008  WBC 19.8*  NEUTROABS 18.4*  HGB 7.5*  HCT 24.8*  MCV 91.9  PLT 205    Cardiac Enzymes: No results for input(s): CKTOTAL, CKMB, CKMBINDEX, TROPONINI in the last 168 hours.  BNP: Invalid input(s): POCBNP  CBG: Recent Labs  Lab 11/06/21 1004  GLUCAP 136*    Microbiology: Results for orders placed or performed during the hospital encounter of 11/06/21  Resp Panel by RT-PCR (Flu A&B, Covid) Nasopharyngeal Swab     Status: None   Collection Time: 11/06/21 11:14 AM   Specimen: Nasopharyngeal Swab; Nasopharyngeal(NP) swabs in vial transport medium  Result Value Ref Range Status   SARS Coronavirus 2 by RT PCR NEGATIVE NEGATIVE Final    Comment: (NOTE) SARS-CoV-2 target nucleic acids are NOT DETECTED.  The SARS-CoV-2 RNA is generally detectable in upper respiratory specimens during the acute phase of infection. The lowest concentration of SARS-CoV-2 viral copies this assay can detect is 138 copies/mL. A negative result  does not preclude SARS-Cov-2 infection and should not be used as the sole basis for treatment or other patient management decisions. A negative result may occur with  improper specimen collection/handling, submission of specimen other than nasopharyngeal swab, presence of viral mutation(s) within the areas targeted by this assay, and inadequate number of viral copies(<138 copies/mL). A negative result must be combined with clinical observations, patient history, and  epidemiological information. The expected result is Negative.  Fact Sheet for Patients:  EntrepreneurPulse.com.au  Fact Sheet for Healthcare Providers:  IncredibleEmployment.be  This test is no t yet approved or cleared by the [place] FDA and  has been authorized for detection and/or diagnosis of SARS-CoV-2 by FDA under an Emergency Use Authorization (EUA). This EUA will remain  in effect (meaning this test can be used) for the duration of the COVID-19 declaration under Section 564(b)(1) of the Act, 21 U.S.C.section 360bbb-3(b)(1), unless the authorization is terminated  or revoked sooner.       Influenza A by PCR NEGATIVE NEGATIVE Final   Influenza B by PCR NEGATIVE NEGATIVE Final    Comment: (NOTE) The Xpert Xpress SARS-CoV-2/FLU/RSV plus assay is intended as an aid in the diagnosis of influenza from Nasopharyngeal swab specimens and should not be used as a sole basis for treatment. Nasal washings and aspirates are unacceptable for Xpert Xpress SARS-CoV-2/FLU/RSV testing.  Fact Sheet for Patients: EntrepreneurPulse.com.au  Fact Sheet for Healthcare Providers: IncredibleEmployment.be  This test is not yet approved or cleared by the [place] FDA and has been authorized for detection and/or diagnosis of SARS-CoV-2 by FDA under an Emergency Use Authorization (EUA). This EUA will remain in effect (meaning this test can be used) for the duration of the COVID-19 declaration under Section 564(b)(1) of the Act, 21 U.S.C. section 360bbb-3(b)(1), unless the authorization is terminated or revoked.  Performed at [facility], [address] [place], [place] [address]     Coagulation Studies: No results for input(s): LABPROT, INR in the last 72 hours.  Urinalysis: No results for input(s): COLORURINE, LABSPEC, PHURINE, GLUCOSEU, HGBUR, BILIRUBINUR, KETONESUR, PROTEINUR, UROBILINOGEN,  NITRITE, LEUKOCYTESUR in the last 72 hours.  Invalid input(s): APPERANCEUR    Imaging: DG Chest 2 View  Result Date: 11/06/2021 CLINICAL DATA:  Leukocytosis, altered mental status EXAM: CHEST - 2 VIEW COMPARISON:  October 2022 FINDINGS: Patchy bilateral opacities with interstitial prominence. Trace pleural effusions. No pneumothorax. Similar cardiomegaly. Left innominate and subclavian venous stents. IMPRESSION: Patchy bilateral opacities with interstitial prominence may reflect edema or multifocal pneumonia. Similar cardiomegaly. Electronically Signed   By: Macy Mis M.D.   On: 11/06/2021 12:28   CT PELVIS W CONTRAST  Result Date: 11/06/2021 CLINICAL DATA:  History of hidradenitis, meeting sepsis criteria, pain from buttock wound, evaluate for underlying abscess EXAM: CT PELVIS WITH CONTRAST TECHNIQUE: Multidetector CT imaging of the pelvis was performed using the standard protocol following the bolus administration of intravenous contrast. RADIATION DOSE REDUCTION: This exam was performed according to the departmental dose-optimization program which includes automated exposure control, adjustment of the mA and/or kV according to patient size and/or use of iterative reconstruction technique. CONTRAST:  12mL OMNIPAQUE IOHEXOL 300 MG/ML  SOLN COMPARISON:  07/27/2021 FINDINGS: Urinary Tract:  No abnormality visualized. Bowel:  Large burden of stool in the distal colon and rectum. Vascular/Lymphatic: Numerous enlarged bilateral inguinal lymph nodes, measuring up to 2.0 x 0.9 cm on the left (series 2, image 41) aortic atherosclerosis. Extensive vascular calcinosis. Reproductive:  No mass or other significant abnormality Other: Very  extensive soft tissue thickening about the bilateral buttocks, anus, medial thighs, and groin, similar in appearance to prior examination, somewhat asymmetrically worse on the left. There is no discrete fluid collection appreciated. Musculoskeletal: No suspicious bone lesions  identified. IMPRESSION: 1. Very extensive subcutaneous soft tissue thickening about the bilateral buttocks, anus, medial thighs, and groin, similar in appearance to prior examination and in keeping with hidradenitis, somewhat asymmetrically worse on the left. There is no discrete fluid collection appreciated. 2. Numerous enlarged bilateral inguinal lymph nodes, likely reactive to infection. 3. Large burden of stool in the distal colon and rectum. Aortic Atherosclerosis (ICD10-I70.0). Electronically Signed   By: Delanna Ahmadi M.D.   On: 11/06/2021 12:50     Medications:    ceFEPime (MAXIPIME) IV     doxycycline (VIBRAMYCIN) IV     lactated ringers     metronidazole     sodium chloride 1,000 mL (11/06/21 1251)    [START ON 11/07/2021] Chlorhexidine Gluconate Cloth  6 each Topical Q0600     Assessment/ Plan:  [name] is a 61 y.o.  male  with a past medical history of anemia, diabetes, hypertension, stroke, bilateral BKA, chronic systolic heart failure, and end stage renal disease on dialysis. Patient presents from [facility] with reported glucose of 39. He is also found to have increased WBCs on labs.  [facility] [address] MWF L AVF /61 kg  Diabetes mellitus type II with chronic kidney disease insulin dependent. Home regimen includes Lantus, NovoLog, and [name]. Most recent hemoglobin A1c is 6.5 on 07/29/21.  Hypoglycemic on EMS arrival to 39.  Treated and currently 136.  Primary team to manage SSI.  Anemia of chronic kidney disease  Lab Results  Component Value Date   HGB 7.5 (L) 11/06/2021    Hemoglobin below desired target.  We will continue EPO inpatient with dialysis treatments.  3.  End-stage renal disease on hemodialysis.  Will maintain outpatient schedule during this admission.  Plan to dialyze today to maintain outpatient schedule.  Low to no UF as tolerated  4. Secondary Hyperparathyroidism: with outpatient labs: phosphorus 3.1, calcium 9.9 on  08/31/22.   Lab Results  Component Value Date   PTH 283 (H) 01/08/2019   CALCIUM 9.2 11/06/2021   CAION 1.10 (L) 09/12/2020   PHOS 6.9 (H) 05/19/2021    Calcium remains within acceptable range.  Patient receives [place] outpatient with meals.  5.  Leukocytosis of unknown origin.  Work-up in progress.  Antibiotics started empirically.   LOS: 0   1/20/20231:11 PM

## 2021-11-06 NOTE — ED Notes (Signed)
Dietary called and asked to send food tray to dialysis. Dialysis also made aware patient would like his food tray brought to dialysis. Dialysis reported they will call dietary if try does not arrive soon

## 2021-11-06 NOTE — Telephone Encounter (Signed)
Have you seen paperwork on Mr. Sax?  Thanks,   -Mickel Baas

## 2021-11-06 NOTE — Consult Note (Addendum)
PHARMACY -  BRIEF ANTIBIOTIC NOTE   Pharmacy has received consult(s) for cefepime from an ED provider.  The patient's profile has been reviewed for ht/wt/allergies/indication/available labs.    One time order(s) placed for cefepime 1 g  Further antibiotics/pharmacy consults should be ordered by admitting physician if indicated.                       Thank you, Darnelle Bos, PharmD 11/06/2021  12:19 PM

## 2021-11-06 NOTE — Progress Notes (Signed)
Initial Nutrition Assessment  DOCUMENTATION CODES:   Not applicable  INTERVENTION:   Ensure Enlive po TID, each supplement provides 350 kcal and 20 grams of protein  Rena-vit po daily   NUTRITION DIAGNOSIS:   Increased nutrient needs related to chronic illness (ESRD on HD, Crohns disease) as evidenced by increased estimated needs.  GOAL:   Patient will meet greater than or equal to 90% of their needs  MONITOR:   PO intake, Supplement acceptance, Labs, Weight trends, Skin, I & O's  REASON FOR ASSESSMENT:   Malnutrition Screening Tool    ASSESSMENT:   61 y/o male with h/o DM, bilateral BKAs, CHF, ESRD on HD, BPH, anemia, perianal Crohn's disease/hidradenitis suppurativa with perianal fistulas (with remicade infusions and azothiaprine), ICH, seizures, PVD, COVID 19 (07/2021), DVT, peritonitis, HTN and cardiomyopathy who is admitted with sepsis, hypoglcemia, possible PNA and cellulitis of buttocks/sacrum.  Unable to see pt today as pt in ED at time of RD visit. Pt is known to this RD from previous admits. Pt with poor appetite and oral intake and chronic malnutrition at baseline. Pt does enjoy chocolate Ensure and usually drinks these while in hospital. RD will add supplements and vitamins to help pt meet his estimated needs and replace losses from HD. Recommend liberalizing pt's diet if his oral intake is poor. Per chart, pt appears fairly weight stable at baseline if his admit weight is correct. RD will obtain nutrition related history and exam at follow-up.   Medications reviewed and include: doxycycline, LRS @150ml /hr  Labs reviewed: Na 134(L), K 4.3 wnl, BUN 43(H), creat 4.16(H) Wbc- 19.8(H), Hgb 7.5(L), Hct 24.8(L) Cbgs- 136, 158 x 24 hrs AIC 6.5(H)- 07/29/21  NUTRITION - FOCUSED PHYSICAL EXAM: Unable to perform at this time   Diet Order:   Diet Order             Diet renal/carb modified with fluid restriction Fluid restriction: Other (see comments); Room service  appropriate? Yes; Fluid consistency: Thin  Diet effective now                  EDUCATION NEEDS:   Not appropriate for education at this time  Skin:  Skin Assessment: Reviewed RN Assessment (cellulitis buttocks/sacrum)  Last BM:  1/20  Height:   Ht Readings from Last 1 Encounters:  11/06/21 5\' 11"  (1.803 m)    Weight:   Wt Readings from Last 1 Encounters:  11/06/21 61.6 kg    Ideal Body Weight:  68.8 kg (adjusted for bilateral BKAs)  BMI:  Body mass index is 18.94 kg/m.  Estimated Nutritional Needs:   Kcal:  1900-2200kcal/day  Protein:  95-110g/day  Fluid:  UOP +1L  Koleen Distance MS, RD, LDN Please refer to [facility] for RD and/or RD on-call/weekend/after hours pager

## 2021-11-06 NOTE — ED Notes (Signed)
Pt stated he feels terrible.. pts c/c is his pain from wound on bottock. Dressing intact and appears clean.

## 2021-11-06 NOTE — Consult Note (Signed)
CODE SEPSIS - PHARMACY COMMUNICATION  **Broad Spectrum Antibiotics should be administered within 1 hour of Sepsis diagnosis**  Time Code Sepsis Called/Page Received: 1213  Antibiotics Ordered: 1213  Time of 1st antibiotic administration: 1322  Additional action taken by pharmacy: N/A  If necessary, Name of Provider/Nurse Contacted: N/A    Darnelle Bos ,PharmD Clinical Pharmacist  11/06/2021  12:18 PM

## 2021-11-06 NOTE — H&P (Signed)
History and Physical    [name] BMW:[id number] DOB: [birth year] DOA: 11/06/2021  PCP: [name], MD   Patient coming from: SNF  I have personally briefly reviewed patient's old medical records in [place]  Chief Complaint: AMS Most of the history was obtained from patient's wife at the bedside.  HPI: [name] is a 61 y.o. male with medical history significant for ESRD on HD (M/W/F), history of peripheral vascular disease status post bilateral BKA, hypertension, CVA, chronic systolic heart failure, history of anemia of chronic disease status post recent blood transfusion, who was sent to the ER from the skilled nursing facility where he resides for evaluation change in mental status.  Patient was noted to be hypoglycemic with a blood sugar of 39. Patient's wife states that his oral intake has been very poor, he will drink liquids but refuses to eat most of the time.  She notes that his blood sugar was elevated the night prior to his admission in the 300s and he received insulin.  On the morning of his admission his blood sugar was 39 and so EMS was called. I am unable to do a review of systems on this patient because he refuses to answer questions. Sodium 134, potassium 4.3, chloride 94, bicarb 29, glucose 132, BUN 43, creatinine 4.16, calcium 9.2, alkaline phosphatase 80, albumin 2.2, AST 15, ALT 9, total protein 7.1, lactic acid 0.8, white count 19.8, hemoglobin 7.5, hematocrit 24.8, MCV 91.9, RDW 16.9, platelet count 205. Respiratory viral panel is negative CT scan of the pelvis with contrast shows extensive subcutaneous soft tissue thickening about the bilateral buttocks, anus, medial thighs and groin similar in appearance and in keeping with patient's known history of hidradenitis somewhat asymmetrically worse on the left.  No discrete fluid collection.  Numerous enlarged bilateral inguinal lymph nodes, likely reactive to infection. Chest x-ray reviewed by me shows  patchy bilateral opacities with interstitial prominence which may reflect edema or multifocal pneumonia.  ED Course: Patient is a 61 year old male with a history of end-stage renal disease on hemodialysis who was sent to the ER for evaluation of mental status. Patient was noted to be hypoglycemic with blood sugar of 39 at the skilled nursing facility. This has improved and patient is more awake and alert and able to tolerate oral intake. He has marked leukocytosis of 19,000 and was hypothermic when he arrived to ER.  He has findings suggestive of cellulitis around the sacral area and will be admitted to the hospital for further evaluation.  Review of Systems: As per HPI otherwise all other systems reviewed and negative.    Past Medical History:  Diagnosis Date   Acute metabolic encephalopathy 27/25/3664   Anemia    Cardiomyopathy ([place])    a. 08/2020 Echo: EF 40-45%, glbo HK, GrII DD; b. 04/2021 Echo: EF 30-35%, glob HK, mild LVH, GrII DD.   Coronary artery calcification seen on CT scan    Crohn disease ([place])    Diabetes mellitus without complication ([place])    DVT of lower extremity (deep venous thrombosis) ([place]) 2016   Empyema ([place]) 05/20/2017   Encephalopathy 12/04/2017   ESRD (end stage renal disease) on hemodyalisis ([place])    Fall at home, initial encounter 09/12/2020   HFrEF (heart failure with reduced ejection fraction) ([place])    a. 08/2020 Echo: EF 40-45%, glbo HK, mod conc LVH, GrII DD, Ao root 30m; b. 04/2021 Echo: EF 30-35%, glob HK, mild LVH, GrII DD, mod red RV  fxn, nl PASP, sev dil LA, mild-mod MR, mod TR, mild-mod AoV sclerosis w/o stenosis.   Hidradenitis suppurativa    Hypertension    ICH (intracerebral hemorrhage) ([place])    Peritonitis ([place]) 04/21/2017   Pyogenic arthritis of knee ([place]) 02/04/2016   Sepsis ([place]) 01/12/2018   Stroke [facility])     Past Surgical History:  Procedure Laterality Date   A/V FISTULAGRAM Left 02/24/2021   Procedure: A/V FISTULAGRAM;  Surgeon:  Katha Cabal, MD;  Location: [place] [facility];  Service: Cardiovascular;  Laterality: Left;   ABDOMINAL SURGERY     AMPUTATION FINGER Left 06/2019   PR AMPUTATION LONG FINGER/THUMB+FLAPS [facility]   ANGIOPLASTY Left    left [facility]pop [facility] 04-11-2018   BELOW KNEE LEG AMPUTATION Right 08/2017   [facility]   COLONOSCOPY     COLONOSCOPY WITH PROPOFOL N/A 10/28/2020   Procedure: COLONOSCOPY WITH PROPOFOL;  Surgeon: Lin Landsman, MD;  Location: [facility];  Service: Gastroenterology;  Laterality: N/A;   COLONOSCOPY WITH PROPOFOL N/A 11/21/2020   Procedure: COLONOSCOPY WITH PROPOFOL;  Surgeon: [name], MD;  Location: [facility] ENDOSCOPY;  Service: Endoscopy;  Laterality: N/A;   DIALYSIS/PERMA CATHETER INSERTION N/A 12/09/2017   Procedure: DIALYSIS/PERMA CATHETER INSERTION;  Surgeon: Katha Cabal, MD;  Location: [place] [facility];  Service: Cardiovascular;  Laterality: N/A;   DIALYSIS/PERMA CATHETER INSERTION N/A 12/12/2017   Procedure: DIALYSIS/PERMA CATHETER INSERTION;  Surgeon: Algernon Huxley, MD;  Location: [place] [facility];  Service: Cardiovascular;  Laterality: N/A;   DIALYSIS/PERMA CATHETER REMOVAL Left 12/09/2017   Procedure: DIALYSIS/PERMA CATHETER REMOVAL;  Surgeon: Katha Cabal, MD;  Location: [place] [facility];  Service: Cardiovascular;  Laterality: Left;   ESOPHAGOGASTRODUODENOSCOPY (EGD) WITH PROPOFOL N/A 11/20/2020   Procedure: ESOPHAGOGASTRODUODENOSCOPY (EGD) WITH PROPOFOL;  Surgeon: [name], MD;  Location: [facility] ENDOSCOPY;  Service: Endoscopy;  Laterality: N/A;   KNEE SURGERY Left 02/04/2016   [facility]   LEG AMPUTATION THROUGH LOWER TIBIA AND FIBULA Left 06/22/2018   [facility]   LOWER EXTREMITY ANGIOGRAPHY Right 08/08/2017   Procedure: Lower Extremity Angiography;  Surgeon: Algernon Huxley, MD;  Location: [place] [facility];  Service: Cardiovascular;  Laterality: Right;   LOWER EXTREMITY ANGIOGRAPHY Right 08/22/2017   Procedure: Lower Extremity Angiography;  Surgeon:  Algernon Huxley, MD;  Location: [place] [facility];  Service: Cardiovascular;  Laterality: Right;   LOWER EXTREMITY INTERVENTION  08/08/2017   Procedure: LOWER EXTREMITY INTERVENTION;  Surgeon: Algernon Huxley, MD;  Location: [place] [facility];  Service: Cardiovascular;;   LOWER EXTREMITY INTERVENTION  08/22/2017   Procedure: LOWER EXTREMITY INTERVENTION;  Surgeon: Algernon Huxley, MD;  Location: [place] [facility];  Service: Cardiovascular;;     reports that he has quit smoking. He has never used smokeless tobacco. He reports current drug use. Drug: Marijuana. He reports that he does not drink alcohol.  Allergies  Allergen Reactions   Methotrexate Other (See Comments)    Blood count drops   Vancomycin Shortness Of Breath    Eyes watering, SOB, wheezing   Cefepime Other (See Comments)    Confusion    Tape     Family History  Problem Relation Age of Onset   Irritable bowel syndrome Sister    Diabetes Sister    Heart disease Mother    Diabetes Mother    Heart disease Father    Rheumatic fever Father        as child   Psoriasis Brother  Arthritis Brother    Diabetes Sister    Diabetes Sister       Prior to Admission medications   Medication Sig Start Date End Date Taking? Authorizing Provider  acetaminophen (TYLENOL) 500 MG tablet Take 1,000 mg by mouth daily as needed for moderate pain or headache.    [provider]  Alcohol Swabs PADS Use as directed to check blood sugar three times daily for insulin dependent type 2 diabetes. 10/20/17   [name], MD  amLODipine (NORVASC) 10 MG tablet Take 1 tablet (10 mg total) by mouth every evening. 08/02/21   Wouk, Ailene Rud, MD  atorvastatin (LIPITOR) 80 MG tablet TAKE 1 TABLET(80 MG) BY MOUTH DAILY 06/25/20   [name], MD  Blood Glucose Monitoring Suppl (ONE TOUCH ULTRA 2) w/Device KIT Use as directed to check blood sugar three times daily. E11.9 02/20/18   [name], MD  carvedilol (COREG) 25 MG tablet  Take 1 tablet (25 mg total) by mouth 2 (two) times daily. 06/27/20   [name], MD  cloNIDine (CATAPRES) 0.2 MG tablet Take 0.5 tablets (0.1 mg total) by mouth 2 (two) times daily. 08/02/21   Wouk, Ailene Rud, MD  feeding supplement (ENSURE ENLIVE / ENSURE PLUS) LIQD Take 237 mLs by mouth 3 (three) times daily between meals. Patient not taking: Reported on 09/24/2021 05/15/21   Lorella Nimrod, MD  gabapentin (NEURONTIN) 100 MG capsule Take 1 capsule (100 mg total) by mouth at bedtime. 08/02/21   Wouk, Ailene Rud, MD  [name] PEN 40 MG/0.8ML PNKT Inject 40 mg into the skin once a week. 07/24/21   [provider]  hydrALAZINE (APRESOLINE) 100 MG tablet Take 1 tablet (100 mg total) by mouth every 8 (eight) hours. 05/15/21   Lorella Nimrod, MD  Insulin Aspart FlexPen (NOVOLOG) 100 UNIT/ML Inject 2-12 Units into the skin 4 (four) times daily -  before meals and at bedtime. Per sliding scale 200-249= 2 units 250-299= 4 units 300-349= 6 units 350-399= 8 units 400-449= 10 units  450-499= 12 units 500 or greater= Call MD 06/01/21   [provider]  insulin glargine-yfgn (SEMGLEE) 100 UNIT/ML injection Inject 0.05 mLs (5 Units total) into the skin daily. 08/02/21   Wouk, Ailene Rud, MD  isosorbide mononitrate (IMDUR) 30 MG 24 hr tablet Take 30 mg by mouth daily. 09/24/21   [provider]  levETIRAcetam (KEPPRA) 750 MG tablet Take 750 mg by mouth daily at 12 noon.    [provider]  lisinopril (ZESTRIL) 40 MG tablet Take 40 mg by mouth daily. 12/17/20   [provider]  Multiple Vitamin (THEREMS PO) Take 1 tablet by mouth daily. multivitamin    [provider]  oxymetazoline (AFRIN) 0.05 % nasal spray Place 1 spray into both nostrils every 2 (two) hours as needed (breakthrough bleeding). Patient not taking: Reported on 09/24/2021 08/02/21   Gwynne Edinger, MD  sevelamer carbonate (RENVELA) 800 MG tablet TAKE 1 TABLET(800 MG) BY MOUTH THREE TIMES  DAILY 09/22/20   [name], MD  spironolactone (ALDACTONE) 25 MG tablet Take 25 mg by mouth at bedtime. Patient not taking: Reported on 09/24/2021    [provider]  traMADol (ULTRAM) 50 MG tablet Take 50 mg by mouth 2 (two) times daily as needed. 09/15/21   [provider]    Physical Exam: Vitals:   11/06/21 1001 11/06/21 1044 11/06/21 1145 11/06/21 1324  BP: (!) 142/58 (!) 145/63  (!) 154/65  Pulse: Marland Kitchen)  55 (!) 56  (!) 57  Resp: '14 20  20  ' Temp:   (!) 95.1 F (35.1 C) 97.6 F (36.4 C)  TempSrc:   Rectal Oral  SpO2: 93% 95%  94%  Weight:      Height:         Vitals:   11/06/21 1001 11/06/21 1044 11/06/21 1145 11/06/21 1324  BP: (!) 142/58 (!) 145/63  (!) 154/65  Pulse: (!) 55 (!) 56  (!) 57  Resp: '14 20  20  ' Temp:   (!) 95.1 F (35.1 C) 97.6 F (36.4 C)  TempSrc:   Rectal Oral  SpO2: 93% 95%  94%  Weight:      Height:          Constitutional: Alert and awake . Not in any apparent distress.  Chronically ill-appearing HEENT:      Head: Normocephalic and atraumatic.         Eyes: PERLA, EOMI, Conjunctivae pallor. Sclera is non-icteric.       Mouth/Throat: Mucous membranes are moist.       Neck: Supple with no signs of meningismus. Cardiovascular: Regular rate and rhythm. No murmurs, gallops, or rubs. 2+ symmetrical distal pulses are present . No JVD. Respiratory: Respiratory effort normal .crackles in both lung bases bilaterally  Gastrointestinal: Soft, non tender, and non distended with positive bowel sounds.  Genitourinary: No CVA tenderness. Musculoskeletal: Bilateral BKA. No cyanosis, or erythema of extremities. Neurologic:  Face is symmetric. Moving all extremities. No gross focal neurologic deficits.  Generalized weakness Skin: Skin is warm, dry.  No rash or ulcers Psychiatric: Depressed mood and flat affect   Labs on Admission: I have personally reviewed following labs and imaging studies  CBC: Recent Labs  Lab 11/06/21 1008   WBC 19.8*  NEUTROABS 18.4*  HGB 7.5*  HCT 24.8*  MCV 91.9  PLT 544   Basic Metabolic Panel: Recent Labs  Lab 11/06/21 1008  NA 134*  K 4.3  CL 94*  CO2 29  GLUCOSE 132*  BUN 43*  CREATININE 4.16*  CALCIUM 9.2   GFR: Estimated Creatinine Clearance: 16.7 mL/min (A) (by C-G formula based on SCr of 4.16 mg/dL (H)). Liver Function Tests: Recent Labs  Lab 11/06/21 1008  AST 15  ALT 9  ALKPHOS 80  BILITOT 0.2*  PROT 7.1  ALBUMIN 2.2*   No results for input(s): LIPASE, AMYLASE in the last 168 hours. No results for input(s): AMMONIA in the last 168 hours. Coagulation Profile: No results for input(s): INR, PROTIME in the last 168 hours. Cardiac Enzymes: No results for input(s): CKTOTAL, CKMB, CKMBINDEX, TROPONINI in the last 168 hours. BNP (last 3 results) No results for input(s): PROBNP in the last 8760 hours. HbA1C: No results for input(s): HGBA1C in the last 72 hours. CBG: Recent Labs  Lab 11/06/21 1004  GLUCAP 136*   Lipid Profile: No results for input(s): CHOL, HDL, LDLCALC, TRIG, CHOLHDL, LDLDIRECT in the last 72 hours. Thyroid Function Tests: No results for input(s): TSH, T4TOTAL, FREET4, T3FREE, THYROIDAB in the last 72 hours. Anemia Panel: No results for input(s): VITAMINB12, FOLATE, FERRITIN, TIBC, IRON, RETICCTPCT in the last 72 hours. Urine analysis:    Component Value Date/Time   COLORURINE YELLOW (A) 07/27/2021 0724   APPEARANCEUR HAZY (A) 07/27/2021 0724   LABSPEC 1.011 07/27/2021 0724   PHURINE 7.0 07/27/2021 0724   GLUCOSEU 150 (A) 07/27/2021 0724   HGBUR NEGATIVE 07/27/2021 [place] 07/27/2021 [place] 07/27/2021 0724  PROTEINUR >=300 (A) 07/27/2021 0724   NITRITE NEGATIVE 07/27/2021 0724   LEUKOCYTESUR NEGATIVE 07/27/2021 0724    Radiological Exams on Admission: DG Chest 2 View  Result Date: 11/06/2021 CLINICAL DATA:  Leukocytosis, altered mental status EXAM: CHEST - 2 VIEW COMPARISON:  October  2022 FINDINGS: Patchy bilateral opacities with interstitial prominence. Trace pleural effusions. No pneumothorax. Similar cardiomegaly. Left innominate and subclavian venous stents. IMPRESSION: Patchy bilateral opacities with interstitial prominence may reflect edema or multifocal pneumonia. Similar cardiomegaly. Electronically Signed   By: Macy Mis M.D.   On: 11/06/2021 12:28   CT PELVIS W CONTRAST  Result Date: 11/06/2021 CLINICAL DATA:  History of hidradenitis, meeting sepsis criteria, pain from buttock wound, evaluate for underlying abscess EXAM: CT PELVIS WITH CONTRAST TECHNIQUE: Multidetector CT imaging of the pelvis was performed using the standard protocol following the bolus administration of intravenous contrast. RADIATION DOSE REDUCTION: This exam was performed according to the departmental dose-optimization program which includes automated exposure control, adjustment of the mA and/or kV according to patient size and/or use of iterative reconstruction technique. CONTRAST:  73m OMNIPAQUE IOHEXOL 300 MG/ML  SOLN COMPARISON:  07/27/2021 FINDINGS: Urinary Tract:  No abnormality visualized. Bowel:  Large burden of stool in the distal colon and rectum. Vascular/Lymphatic: Numerous enlarged bilateral inguinal lymph nodes, measuring up to 2.0 x 0.9 cm on the left (series 2, image 41) aortic atherosclerosis. Extensive vascular calcinosis. Reproductive:  No mass or other significant abnormality Other: Very extensive soft tissue thickening about the bilateral buttocks, anus, medial thighs, and groin, similar in appearance to prior examination, somewhat asymmetrically worse on the left. There is no discrete fluid collection appreciated. Musculoskeletal: No suspicious bone lesions identified. IMPRESSION: 1. Very extensive subcutaneous soft tissue thickening about the bilateral buttocks, anus, medial thighs, and groin, similar in appearance to prior examination and in keeping with hidradenitis, somewhat  asymmetrically worse on the left. There is no discrete fluid collection appreciated. 2. Numerous enlarged bilateral inguinal lymph nodes, likely reactive to infection. 3. Large burden of stool in the distal colon and rectum. Aortic Atherosclerosis (ICD10-I70.0). Electronically Signed   By: ADelanna AhmadiM.D.   On: 11/06/2021 12:50     Assessment/Plan Principal Problem:   Sepsis ([place] Active Problems:   Chronic systolic CHF (congestive heart failure) (HCC)   Hidradenitis suppurativa   Type 2 diabetes mellitus with hypoglycemia without coma (HCC)   S/P bilateral BKA (below knee amputation) ([place]   Anemia in ESRD ([place]-stage renal disease) [place]      Patient is a 61year old male sent to the ER for evaluation of mental status changes and hypoglycemia.   Sepsis As evidenced by T-max of 95.1, marked leukocytosis and cellulitis involving the buttocks/sacrum in the setting of hidradenitis. Concern for infection with MRSA Continue doxycycline initiated in the ER Follow-up results of blood cultures.     Diabetes mellitus with complications of end-stage renal disease on hemodialysis and hypoglycemia. Patient dialysis days are M/W/F We will request nephrology consult for renal replacement therapy Patient with an episode of iatrogenic hypoglycemia Encourage oral intake Hold insulin Check blood sugars every 4 hours      PVD Status post bilateral BKA Continue statins statins and Carvedilol    Hypertension Patient is on multiple antihypertensive medications which will be resumed and include hydralazine, lisinopril, Imdur, carvedilol and amlodipine     Chronic systolic heart failure Last known LVEF of 30 to 35% from a 2D echocardiogram which was done 07/22 Continue renal replacement therapy for fluid management Continue  carvedilol and lisinopril     History of seizure disorder Continue Keppra   DVT prophylaxis: None  Code Status: full code  Family Communication:  Greater than 50% of time was spent discussing patient's condition and plan of care with his wife at the bedside.  All questions and concerns have been addressed.  She verbalizes understanding and agrees to the plan. Disposition Plan: Back to previous home environment Consults called: Nephrology  Status:At the time of admission, it appears that the appropriate admission status for this patient is inpatient. This is judged to be reasonable and necessary to provide the required intensity of service to ensure the patient's safety given the presenting symptoms, physical exam findings, and initial radiographic and laboratory data in the context of their comorbid conditions. Patient requires inpatient status due to high intensity of service, high risk for further deterioration and high frequency of surveillance required.     Collier Bullock MD [facility]     11/06/2021, 2:41 PM

## 2021-11-06 NOTE — ED Provider Notes (Signed)
[facility] Provider Note    Event Date/Time   First MD Initiated Contact with Patient 11/06/21 1104     (approximate)   History   Hypoglycemia and Altered Mental Status   HPI  [name] is a 61 y.o. male who presents to the ED for evaluation of Hypoglycemia and Altered Mental Status   I review outpatient cardiology visit from 12/8.  History of ESRD on hemodialysis, reduced ejection fraction of about 40%, stroke and seizure disorder, anemia of chronic disease requiring multiple transfusions PAD with bilateral BKA's. I reviewed DC summary from last medical admission in October.  Admitted for confusion and somnolence, hypoglycemia.  Patient presents to the ED from his local SNF for evaluation of confusion and hypoglycemia.  He was reportedly altered and somnolent this morning on initial check, and his glucose was noted to be between the 30s in the 40s.  Improved with oral glucose with EMS.  His wife meets him in the ED and provides majority of history.  Patient reports feeling fine and does not really have any complaints.  He is fairly reticent.  His wife reports that he seems better now, but still not quite right.  Reports that he has chronic wounds to his buttocks from hidradenitis.  Physical Exam   Triage Vital Signs: ED Triage Vitals  Enc Vitals Group     BP 11/06/21 1001 (!) 142/58     Pulse Rate 11/06/21 1001 (!) 55     Resp 11/06/21 1001 14     Temp --      Temp src --      SpO2 11/06/21 1001 93 %     Weight 11/06/21 0958 135 lb 12.9 oz (61.6 kg)     Height 11/06/21 0958 5\' 11"  (1.803 m)     Head Circumference --      Peak Flow --      Pain Score 11/06/21 0957 0     Pain Loc --      Pain Edu? --      Excl. in [place]? --     Most recent vital signs: Vitals:   11/06/21 1324 11/06/21 1501  BP: (!) 154/65   Pulse: (!) 57   Resp: 20   Temp: 97.6 F (36.4 C) (!) 97.4 F (36.3 C)  SpO2: 94%     General: Awake, no distress.  Able to  assist with turning and position changes.  Bilateral BKA's are present. CV:  Good peripheral perfusion.  Resp:  Normal effort.  No distress Abd:  No distention.  Nontender MSK:  Multiple wounds to the sacrum and buttocks bilaterally.  All covered with clean dressings from the facility.  The superior left wound, to his lower lumbar back and lateral sacral area, is expressing purulent material. Neuro:  No focal deficits appreciated. Other:     ED Results / Procedures / Treatments   Labs (all labs ordered are listed, but only abnormal results are displayed) Labs Reviewed  COMPREHENSIVE METABOLIC PANEL - Abnormal; Notable for the following components:      Result Value   Sodium 134 (*)    Chloride 94 (*)    Glucose, Bld 132 (*)    BUN 43 (*)    Creatinine, Ser 4.16 (*)    Albumin 2.2 (*)    Total Bilirubin 0.2 (*)    GFR, Estimated 16 (*)    All other components within normal limits  CBC WITH DIFFERENTIAL/PLATELET - Abnormal; Notable for the  following components:   WBC 19.8 (*)    RBC 2.70 (*)    Hemoglobin 7.5 (*)    HCT 24.8 (*)    RDW 16.9 (*)    Neutro Abs 18.4 (*)    Lymphs Abs 0.5 (*)    Abs Immature Granulocytes 0.54 (*)    All other components within normal limits  CBG MONITORING, ED - Abnormal; Notable for the following components:   Glucose-Capillary 136 (*)    All other components within normal limits  RESP PANEL BY RT-PCR (FLU A&B, COVID) ARPGX2  CULTURE, BLOOD (SINGLE)  LACTIC ACID, PLASMA  URINALYSIS, ROUTINE W REFLEX MICROSCOPIC    EKG    RADIOLOGY 2 view CXR reviewed by me with patchy multifocal opacities. CT pelvis reviewed by me without evidence of deep abscess  Official radiology report(s): DG Chest 2 View  Result Date: 11/06/2021 CLINICAL DATA:  Leukocytosis, altered mental status EXAM: CHEST - 2 VIEW COMPARISON:  October 2022 FINDINGS: Patchy bilateral opacities with interstitial prominence. Trace pleural effusions. No pneumothorax. Similar  cardiomegaly. Left innominate and subclavian venous stents. IMPRESSION: Patchy bilateral opacities with interstitial prominence may reflect edema or multifocal pneumonia. Similar cardiomegaly. Electronically Signed   By: Macy Mis M.D.   On: 11/06/2021 12:28   CT PELVIS W CONTRAST  Result Date: 11/06/2021 CLINICAL DATA:  History of hidradenitis, meeting sepsis criteria, pain from buttock wound, evaluate for underlying abscess EXAM: CT PELVIS WITH CONTRAST TECHNIQUE: Multidetector CT imaging of the pelvis was performed using the standard protocol following the bolus administration of intravenous contrast. RADIATION DOSE REDUCTION: This exam was performed according to the departmental dose-optimization program which includes automated exposure control, adjustment of the mA and/or kV according to patient size and/or use of iterative reconstruction technique. CONTRAST:  56mL OMNIPAQUE IOHEXOL 300 MG/ML  SOLN COMPARISON:  07/27/2021 FINDINGS: Urinary Tract:  No abnormality visualized. Bowel:  Large burden of stool in the distal colon and rectum. Vascular/Lymphatic: Numerous enlarged bilateral inguinal lymph nodes, measuring up to 2.0 x 0.9 cm on the left (series 2, image 41) aortic atherosclerosis. Extensive vascular calcinosis. Reproductive:  No mass or other significant abnormality Other: Very extensive soft tissue thickening about the bilateral buttocks, anus, medial thighs, and groin, similar in appearance to prior examination, somewhat asymmetrically worse on the left. There is no discrete fluid collection appreciated. Musculoskeletal: No suspicious bone lesions identified. IMPRESSION: 1. Very extensive subcutaneous soft tissue thickening about the bilateral buttocks, anus, medial thighs, and groin, similar in appearance to prior examination and in keeping with hidradenitis, somewhat asymmetrically worse on the left. There is no discrete fluid collection appreciated. 2. Numerous enlarged bilateral inguinal  lymph nodes, likely reactive to infection. 3. Large burden of stool in the distal colon and rectum. Aortic Atherosclerosis (ICD10-I70.0). Electronically Signed   By: Delanna Ahmadi M.D.   On: 11/06/2021 12:50    PROCEDURES and INTERVENTIONS:  .1-3 Lead EKG Interpretation Performed by: Vladimir Crofts, MD Authorized by: Vladimir Crofts, MD     Interpretation: normal     ECG rate:  60   ECG rate assessment: normal     Rhythm: sinus rhythm     Ectopy: none     Conduction: normal   .Critical Care Performed by: Vladimir Crofts, MD Authorized by: Vladimir Crofts, MD   Critical care provider statement:    Critical care time (minutes):  30   Critical care time was exclusive of:  Separately billable procedures and treating other patients   Critical care was necessary to  treat or prevent imminent or life-threatening deterioration of the following conditions:  Sepsis   Critical care was time spent personally by me on the following activities:  Development of treatment plan with patient or surrogate, discussions with consultants, evaluation of patient's response to treatment, examination of patient, ordering and review of laboratory studies, ordering and review of radiographic studies, ordering and performing treatments and interventions, pulse oximetry, re-evaluation of patient's condition and review of old charts  Medications  lactated ringers infusion (has no administration in time range)  doxycycline (VIBRAMYCIN) 100 mg in sodium chloride 0.9 % 250 mL IVPB (has no administration in time range)  ceFEPIme (MAXIPIME) 1 g in sodium chloride 0.9 % 100 mL IVPB (1 g Intravenous Patient Refused/Not Given 11/06/21 1251)  Chlorhexidine Gluconate Cloth 2 % PADS 6 each (has no administration in time range)  doxycycline (VIBRAMYCIN) 100 mg in sodium chloride 0.9 % 250 mL IVPB (has no administration in time range)  acetaminophen (TYLENOL) 325 MG tablet (has no administration in time range)  sodium chloride 0.9 % bolus  1,000 mL (0 mLs Intravenous Stopped 11/06/21 1427)  metroNIDAZOLE (FLAGYL) IVPB 500 mg (0 mg Intravenous Stopped 11/06/21 1441)  iohexol (OMNIPAQUE) 300 MG/ML solution 75 mL (75 mLs Intravenous Contrast Given 11/06/21 1226)  acetaminophen (TYLENOL) tablet 650 mg (650 mg Oral Given 11/06/21 1600)     IMPRESSION / MDM / [place] / ED COURSE  I reviewed the triage vital signs and the nursing notes.  61 year old male present to the ED with evidence of sepsis from skin and soft tissue infection, requiring medical admission.  His rectal temperature is 95 degrees, and alongside his leukocytosis he is meeting sepsis criteria.  Chronic anemia of disease at around baseline without need for transfusion.  Stigmata of ESRD without acute features on his metabolic panel.  I discussed with nephrology, who agrees to set the patient up for dialysis today.  CT scan without evidence of deep abscess or need for surgical evaluation.  We will initiate antibiotics to treat skin and soft tissue infections, as well as possibly pneumonia.  Consulted with medicine who agrees to admit.  Clinical Course as of 11/06/21 1607  Fri Nov 06, 2021  1214 Chatting with pharmacy regarding antibiotics with his allergies.  [DS]  1222 Reassessed and updated family. [DS]  1222 We discussed sepsis, need for CT scan and medical admission.  They are agreeable. [DS]  1226 I consult with Dr. Juleen China who agrees to help facilitate dialysis later today [DS]    Clinical Course User Index [DS] Vladimir Crofts, MD     FINAL CLINICAL IMPRESSION(S) / ED DIAGNOSES   Final diagnoses:  Sepsis without acute organ dysfunction, due to unspecified organism [facility]     Rx / DC Orders   ED Discharge Orders     None        Note:  This document was prepared using Dragon voice recognition software and may include unintentional dictation errors.   Vladimir Crofts, MD 11/06/21 [phone]

## 2021-11-06 NOTE — Progress Notes (Signed)
Elink following sepsis protocol.  

## 2021-11-06 NOTE — ED Notes (Signed)
Warm blankets placed on patient. Coffee and cream/sugar given to wife at bedside

## 2021-11-07 ENCOUNTER — Encounter: Payer: Self-pay | Admitting: Internal Medicine

## 2021-11-07 DIAGNOSIS — E11649 Type 2 diabetes mellitus with hypoglycemia without coma: Secondary | ICD-10-CM

## 2021-11-07 DIAGNOSIS — I10 Essential (primary) hypertension: Secondary | ICD-10-CM

## 2021-11-07 DIAGNOSIS — L732 Hidradenitis suppurativa: Secondary | ICD-10-CM

## 2021-11-07 DIAGNOSIS — E785 Hyperlipidemia, unspecified: Secondary | ICD-10-CM

## 2021-11-07 LAB — CBC
HCT: 22.6 % — ABNORMAL LOW (ref 39.0–52.0)
Hemoglobin: 6.9 g/dL — ABNORMAL LOW (ref 13.0–17.0)
MCH: 27.6 pg (ref 26.0–34.0)
MCHC: 30.5 g/dL (ref 30.0–36.0)
MCV: 90.4 fL (ref 80.0–100.0)
Platelets: 195 10*3/uL (ref 150–400)
RBC: 2.5 MIL/uL — ABNORMAL LOW (ref 4.22–5.81)
RDW: 16.7 % — ABNORMAL HIGH (ref 11.5–15.5)
WBC: 12.2 10*3/uL — ABNORMAL HIGH (ref 4.0–10.5)
nRBC: 0 % (ref 0.0–0.2)

## 2021-11-07 LAB — GLUCOSE, CAPILLARY
Glucose-Capillary: 103 mg/dL — ABNORMAL HIGH (ref 70–99)
Glucose-Capillary: 131 mg/dL — ABNORMAL HIGH (ref 70–99)
Glucose-Capillary: 142 mg/dL — ABNORMAL HIGH (ref 70–99)
Glucose-Capillary: 177 mg/dL — ABNORMAL HIGH (ref 70–99)
Glucose-Capillary: 183 mg/dL — ABNORMAL HIGH (ref 70–99)
Glucose-Capillary: 221 mg/dL — ABNORMAL HIGH (ref 70–99)

## 2021-11-07 LAB — BASIC METABOLIC PANEL
Anion gap: 12 (ref 5–15)
BUN: 27 mg/dL — ABNORMAL HIGH (ref 6–20)
CO2: 29 mmol/L (ref 22–32)
Calcium: 8.8 mg/dL — ABNORMAL LOW (ref 8.9–10.3)
Chloride: 95 mmol/L — ABNORMAL LOW (ref 98–111)
Creatinine, Ser: 2.73 mg/dL — ABNORMAL HIGH (ref 0.61–1.24)
GFR, Estimated: 26 mL/min — ABNORMAL LOW (ref >60)
Glucose, Bld: 150 mg/dL — ABNORMAL HIGH (ref 70–99)
Potassium: 3.6 mmol/L (ref 3.5–5.1)
Sodium: 136 mmol/L (ref 135–145)

## 2021-11-07 LAB — MRSA NEXT GEN BY PCR, NASAL: MRSA by PCR Next Gen: NOT DETECTED

## 2021-11-07 LAB — CORTISOL-AM, BLOOD: Cortisol - AM: 13 ug/dL (ref 6.7–22.6)

## 2021-11-07 LAB — PROCALCITONIN: Procalcitonin: 30.43 ng/mL

## 2021-11-07 LAB — PROTIME-INR
INR: 1.2 (ref 0.8–1.2)
Prothrombin Time: 14.8 seconds (ref 11.4–15.2)

## 2021-11-07 LAB — HEPATITIS B SURFACE ANTIBODY,QUALITATIVE: Hep B S Ab: REACTIVE — AB

## 2021-11-07 LAB — HIV ANTIBODY (ROUTINE TESTING W REFLEX): HIV Screen 4th Generation wRfx: NONREACTIVE

## 2021-11-07 LAB — PREPARE RBC (CROSSMATCH)

## 2021-11-07 MED ORDER — SODIUM CHLORIDE 0.9% IV SOLUTION: Freq: Once | INTRAVENOUS | Status: AC

## 2021-11-07 MED ORDER — FUROSEMIDE 10 MG/ML IJ SOLN
80.0000 mg | Freq: Once | INTRAMUSCULAR | Status: AC
  Administered 2021-11-07: 80 mg via INTRAVENOUS
  Filled 2021-11-07: qty 8

## 2021-11-07 MED ORDER — ACETAMINOPHEN 325 MG PO TABS
650.0000 mg | ORAL_TABLET | Freq: Once | ORAL | Status: AC
  Administered 2021-11-07: 650 mg via ORAL
  Filled 2021-11-07: qty 2

## 2021-11-07 NOTE — Progress Notes (Signed)
[facility]  ROUNDING NOTE   Subjective:   [name] is a 61 y.o. male with a past medical history of anemia, diabetes, hypertension, stroke, bilateral BKA, chronic systolic heart failure, and end stage renal disease on dialysis. Patient presents from [facility] with reported glucose of 39. He is also found to have increased WBCs on labs.   Patient found resting in bed, getting attended by nursing staff, for placing the external urinary drainage system. He denies SOB,nausea, vomiting. Primary team planning for blood transfusion today.    Objective:  Vital signs in last 24 hours:  Temp:  [95.1 F (35.1 C)-99.3 F (37.4 C)] 98.8 F (37.1 C) (01/21 0719) Pulse Rate:  [56-71] 70 (01/21 0719) Resp:  [13-20] 20 (01/21 0719) BP: (139-163)/(63-80) 141/77 (01/21 0719) SpO2:  [94 %-100 %] 97 % (01/21 0719)  Weight change:  Filed Weights   11/06/21 0958  Weight: 61.6 kg    Intake/Output: I/O last 3 completed shifts: In: 240 [P.O.:240] Out: 778 [Other:778]   Intake/Output this shift:  No intake/output data recorded.  Physical Exam: General: In no acute distress  Head: Moist oral mucosal membranes  Eyes: Anicteric  Lungs:  Normal effort and symmetrical respiratory effort, lungs clear  Heart: S1S2,no rubs or gallops  Abdomen:  Soft, nontender,non distended  Extremities: No peripheral edema.Bilateral BKA  Neurologic: Awake,alert,oriented  Skin: No lesions or rashes  Access: Left AVF    Basic Metabolic Panel: Recent Labs  Lab 11/06/21 1008 11/07/21 0459  NA 134* 136  K 4.3 3.6  CL 94* 95*  CO2 29 29  GLUCOSE 132* 150*  BUN 43* 27*  CREATININE 4.16* 2.73*  CALCIUM 9.2 8.8*     Liver Function Tests: Recent Labs  Lab 11/06/21 1008  AST 15  ALT 9  ALKPHOS 80  BILITOT 0.2*  PROT 7.1  ALBUMIN 2.2*    No results for input(s): LIPASE, AMYLASE in the last 168 hours. No results for input(s): AMMONIA in the last 168 hours.  CBC: Recent  Labs  Lab 11/06/21 1008 11/07/21 0459  WBC 19.8* 12.2*  NEUTROABS 18.4*  --   HGB 7.5* 6.9*  HCT 24.8* 22.6*  MCV 91.9 90.4  PLT 205 195     Cardiac Enzymes: No results for input(s): CKTOTAL, CKMB, CKMBINDEX, TROPONINI in the last 168 hours.  BNP: Invalid input(s): POCBNP  CBG: Recent Labs  Lab 11/06/21 1004 11/06/21 2003 11/07/21 0010 11/07/21 0435 11/07/21 0721  GLUCAP 136* 150* 177* 142* 131*     Microbiology: Results for orders placed or performed during the hospital encounter of 11/06/21  Resp Panel by RT-PCR (Flu A&B, Covid) Nasopharyngeal Swab     Status: None   Collection Time: 11/06/21 11:14 AM   Specimen: Nasopharyngeal Swab; Nasopharyngeal(NP) swabs in vial transport medium  Result Value Ref Range Status   SARS Coronavirus 2 by RT PCR NEGATIVE NEGATIVE Final    Comment: (NOTE) SARS-CoV-2 target nucleic acids are NOT DETECTED.  The SARS-CoV-2 RNA is generally detectable in upper respiratory specimens during the acute phase of infection. The lowest concentration of SARS-CoV-2 viral copies this assay can detect is 138 copies/mL. A negative result does not preclude SARS-Cov-2 infection and should not be used as the sole basis for treatment or other patient management decisions. A negative result may occur with  improper specimen collection/handling, submission of specimen other than nasopharyngeal swab, presence of viral mutation(s) within the areas targeted by this assay, and inadequate number of viral copies(<138 copies/mL).  A negative result must be combined with clinical observations, patient history, and epidemiological information. The expected result is Negative.  Fact Sheet for Patients:  EntrepreneurPulse.com.au  Fact Sheet for Healthcare Providers:  IncredibleEmployment.be  This test is no t yet approved or cleared by the [place] FDA and  has been authorized for detection and/or diagnosis of  SARS-CoV-2 by FDA under an Emergency Use Authorization (EUA). This EUA will remain  in effect (meaning this test can be used) for the duration of the COVID-19 declaration under Section 564(b)(1) of the Act, 21 U.S.C.section 360bbb-3(b)(1), unless the authorization is terminated  or revoked sooner.       Influenza A by PCR NEGATIVE NEGATIVE Final   Influenza B by PCR NEGATIVE NEGATIVE Final    Comment: (NOTE) The Xpert Xpress SARS-CoV-2/FLU/RSV plus assay is intended as an aid in the diagnosis of influenza from Nasopharyngeal swab specimens and should not be used as a sole basis for treatment. Nasal washings and aspirates are unacceptable for Xpert Xpress SARS-CoV-2/FLU/RSV testing.  Fact Sheet for Patients: EntrepreneurPulse.com.au  Fact Sheet for Healthcare Providers: IncredibleEmployment.be  This test is not yet approved or cleared by the [place] FDA and has been authorized for detection and/or diagnosis of SARS-CoV-2 by FDA under an Emergency Use Authorization (EUA). This EUA will remain in effect (meaning this test can be used) for the duration of the COVID-19 declaration under Section 564(b)(1) of the Act, 21 U.S.C. section 360bbb-3(b)(1), unless the authorization is terminated or revoked.  Performed at [facility], [address] [place], [place] [address]   Culture, blood (single)     Status: None (Preliminary result)   Collection Time: 11/06/21 12:21 PM   Specimen: BLOOD  Result Value Ref Range Status   Specimen Description BLOOD BLOOD RIGHT ARM  Final   Special Requests   Final    BOTTLES DRAWN AEROBIC AND ANAEROBIC Blood Culture results may not be optimal due to an excessive volume of blood received in culture bottles   Culture   Final    NO GROWTH < 24 HOURS Performed at [facility], [address] [place], [place] [address]    Report Status PENDING  Incomplete  MRSA Next Gen by PCR, Nasal      Status: None   Collection Time: 11/07/21  5:08 AM   Specimen: Nasal Mucosa; Nasal Swab  Result Value Ref Range Status   MRSA by PCR Next Gen NOT DETECTED NOT DETECTED Final    Comment: (NOTE) The GeneXpert MRSA Assay (FDA approved for NASAL specimens only), is one component of a comprehensive MRSA colonization surveillance program. It is not intended to diagnose MRSA infection nor to guide or monitor treatment for MRSA infections. Test performance is not FDA approved in patients less than 78 years old. Performed at [facility], [address] [place], [place] [address]     Coagulation Studies: Recent Labs    11/07/21 0459  LABPROT 14.8  INR 1.2    Urinalysis: No results for input(s): COLORURINE, LABSPEC, PHURINE, GLUCOSEU, HGBUR, BILIRUBINUR, KETONESUR, PROTEINUR, UROBILINOGEN, NITRITE, LEUKOCYTESUR in the last 72 hours.  Invalid input(s): APPERANCEUR    Imaging: DG Chest 2 View  Result Date: 11/06/2021 CLINICAL DATA:  Leukocytosis, altered mental status EXAM: CHEST - 2 VIEW COMPARISON:  October 2022 FINDINGS: Patchy bilateral opacities with interstitial prominence. Trace pleural effusions. No pneumothorax. Similar cardiomegaly. Left innominate and subclavian venous stents. IMPRESSION: Patchy bilateral opacities with interstitial prominence may reflect edema or multifocal pneumonia. Similar  cardiomegaly. Electronically Signed   By: Macy Mis M.D.   On: 11/06/2021 12:28   CT PELVIS W CONTRAST  Result Date: 11/06/2021 CLINICAL DATA:  History of hidradenitis, meeting sepsis criteria, pain from buttock wound, evaluate for underlying abscess EXAM: CT PELVIS WITH CONTRAST TECHNIQUE: Multidetector CT imaging of the pelvis was performed using the standard protocol following the bolus administration of intravenous contrast. RADIATION DOSE REDUCTION: This exam was performed according to the departmental dose-optimization program which includes automated exposure control,  adjustment of the mA and/or kV according to patient size and/or use of iterative reconstruction technique. CONTRAST:  94mL OMNIPAQUE IOHEXOL 300 MG/ML  SOLN COMPARISON:  07/27/2021 FINDINGS: Urinary Tract:  No abnormality visualized. Bowel:  Large burden of stool in the distal colon and rectum. Vascular/Lymphatic: Numerous enlarged bilateral inguinal lymph nodes, measuring up to 2.0 x 0.9 cm on the left (series 2, image 41) aortic atherosclerosis. Extensive vascular calcinosis. Reproductive:  No mass or other significant abnormality Other: Very extensive soft tissue thickening about the bilateral buttocks, anus, medial thighs, and groin, similar in appearance to prior examination, somewhat asymmetrically worse on the left. There is no discrete fluid collection appreciated. Musculoskeletal: No suspicious bone lesions identified. IMPRESSION: 1. Very extensive subcutaneous soft tissue thickening about the bilateral buttocks, anus, medial thighs, and groin, similar in appearance to prior examination and in keeping with hidradenitis, somewhat asymmetrically worse on the left. There is no discrete fluid collection appreciated. 2. Numerous enlarged bilateral inguinal lymph nodes, likely reactive to infection. 3. Large burden of stool in the distal colon and rectum. Aortic Atherosclerosis (ICD10-I70.0). Electronically Signed   By: Delanna Ahmadi M.D.   On: 11/06/2021 12:50     Medications:    sodium chloride     sodium chloride     sodium chloride 10 mL/hr at 11/06/21 2233   ceFEPime (MAXIPIME) IV     doxycycline (VIBRAMYCIN) IV      sodium chloride   Intravenous Once   acetaminophen  650 mg Oral Once   amLODipine  10 mg Oral QPM   atorvastatin  80 mg Oral Daily   carvedilol  25 mg Oral BID   Chlorhexidine Gluconate Cloth  6 each Topical Q0600   feeding supplement  237 mL Oral TID BM   feeding supplement  237 mL Oral TID BM   furosemide  80 mg Intravenous Once   gabapentin  100 mg Oral QHS   hydrALAZINE   100 mg Oral Q8H   isosorbide mononitrate  30 mg Oral Daily   levETIRAcetam  750 mg Oral Q1200   lisinopril  40 mg Oral Daily   multivitamin  1 tablet Oral QHS   sevelamer carbonate  800 mg Oral TID WC     Assessment/ Plan:  [name] is a 61 y.o.  male  with a past medical history of anemia, diabetes, hypertension, stroke, bilateral BKA, chronic systolic heart failure, and end stage renal disease on dialysis. Patient presents from [facility] with reported glucose of 39. He is also found to have increased WBCs on labs.  [facility] [address] MWF L AVF /61 kg  #End-stage renal disease on hemodialysis.  Will maintain outpatient schedule during this admission.  Plan to dialyze today to maintain outpatient schedule.  Low to no UF as tolerated  #Diabetes mellitus type II with chronic kidney disease insulin dependent. Home regimen includes Lantus, NovoLog, and [name]. Most recent hemoglobin A1c is 6.5 on 07/29/21.  Hypoglycemic on EMS  arrival to 30.   Blood glucose today is 150 Primary team managing DM  #Anemia of chronic kidney disease  Lab Results  Component Value Date   HGB 6.9 (L) 11/07/2021  Primary team planning for blood transfusion today  #Secondary Hyperparathyroidism  Lab Results  Component Value Date   PTH 283 (H) 01/08/2019   CALCIUM 8.8 (L) 11/07/2021   CAION 1.10 (L) 09/12/2020   PHOS 6.9 (H) 05/19/2021   On Sevelamer 800 mg TID  # Leukocytosis of unknown origin.  Work-up in progress.   On Cefepime and Doxycycline   LOS: 1 [name] 1/21/202310:37 AM

## 2021-11-07 NOTE — Consult Note (Signed)
Patient ID: [name], male   DOB: 1960-10-25, 61 y.o.   MRN: [id number]  HPI [name] is a 61 y.o. male seen in consultation at the request of Dr.Wieting , case d/w him in detail.  Gamy with hypoglycemia and altered mental status.  He does have significant medical issues including end-stage renal disease on hemodialysis, diabetes, hypertension, limited mobility status post bilateral BKA.  Chronic anemia, seizure disorder, chronic hidradenitis of buttocks.  CHF as well as PAD. Lives in a SNF had altered mental status with hypoglycemia that prompted her ER visit.  Now has improvement.  He endorses chronic drainage from known hidradenitis in both buttocks left worse than the lower right.  He did have a CT scan of the pelvis that I personally reviewed showing evidence of chronic changes of hidradenitis without a focal abscess or evidence of necrotizing infection. Is currently using [name] for hidradenitis.    Sodium 134 (*)      Chloride 94 (*)      Glucose, Bld 132 (*)      BUN 43 (*)      Creatinine, Ser 4.16 (*)      Albumin 2.2 (*)      Total Bilirubin 0.2 (*)      GFR, Estimated 16 (*)      All other components within normal limits  CBC WITH DIFFERENTIAL/PLATELET - Abnormal; Notable for the following components:    WBC 19.8 (*)      RBC 2.70 (*)      Hemoglobin 7.5 (*)      HCT 24.8 (*)      RDW 16.9 (*)      Neutro Abs 18.4 (*)      Lymphs Abs 0.5 (*)      Abs Immature Granulocytes 0.54 (*)    HPI  Past Medical History:  Diagnosis Date   Acute metabolic encephalopathy 03/70/4888   Anemia    Cardiomyopathy ([place])    a. 08/2020 Echo: EF 40-45%, glbo HK, GrII DD; b. 04/2021 Echo: EF 30-35%, glob HK, mild LVH, GrII DD.   Coronary artery calcification seen on CT scan    Crohn disease ([place])    Diabetes mellitus without complication ([place])    DVT of lower extremity (deep venous thrombosis) ([place]) 2016   Empyema ([place]) 05/20/2017   Encephalopathy 12/04/2017   ESRD (end stage  renal disease) on hemodyalisis ([place])    Fall at home, initial encounter 09/12/2020   HFrEF (heart failure with reduced ejection fraction) ([place])    a. 08/2020 Echo: EF 40-45%, glbo HK, mod conc LVH, GrII DD, Ao root 52m; b. 04/2021 Echo: EF 30-35%, glob HK, mild LVH, GrII DD, mod red RV fxn, nl PASP, sev dil LA, mild-mod MR, mod TR, mild-mod AoV sclerosis w/o stenosis.   Hidradenitis suppurativa    Hypertension    ICH (intracerebral hemorrhage) ([place]    Peritonitis ([place] 04/21/2017   Pyogenic arthritis of knee ([place] 02/04/2016   Sepsis ([place] 01/12/2018   Stroke ([facility]     Past Surgical History:  Procedure Laterality Date   A/V FISTULAGRAM Left 02/24/2021   Procedure: A/V FISTULAGRAM;  Surgeon: SKatha Cabal MD;  [place];  Service: Cardiovascular;  Laterality: Left;   ABDOMINAL SURGERY     AMPUTATION FINGER Left 06/2019   PR AMPUTATION LONG FINGER/THUMB+FLAPS [facility]   [facility]    [id number]   BELOW KNEE LEG [facility]ATION [facility]     COLONOSCOPY WITH PROPOFOL N/A 10/28/2020   Procedure: COLONOSCOPY WITH PROPOFOL;  Surgeon: Lin Landsman, MD;  Location[facility] ENDOSCOPY;  Service: Gastroenterology;  Laterality: N/A;   COLONOSCOPY WITH PROPOFOL N/A 11/21/2020   Procedure: COLONOSCOPY WITH PROPOFOL;  Surgeon: [name], MD;  Location: [facility] ENDOSCOPY;  Service: Endoscopy;  Laterality: N/A;   DIALYSIS/PERMA CATHETER INSERTION N/A 12/09/2017   Procedure: DIALYSIS/PERMA CATHETER INSERTION;  Surgeon: Katha Cabal, MD;  Location: [place] CV LAB;  Service: Cardiovascular;  Laterality: N/A;   DIALYSIS/PERMA CATHETER INSERTION N/A 12/12/2017   Procedure: DIALYSIS/PERMA CATHETER INSERTION;  Surgeon: Algernon Huxley, MD;  Location: [place] CV LAB;  Service: Cardiovascular;  Laterality: N/A;   DIALYSIS/PERMA CATHETER REMOVAL Left 12/09/2017   Procedure: DIALYSIS/PERMA CATHETER REMOVAL;  Surgeon: Katha Cabal, MD;  Location: [place] CV LAB;  Service: Cardiovascular;  Laterality: Left;   ESOPHAGOGASTRODUODENOSCOPY (EGD) WITH PROPOFOL N/A 11/20/2020   Procedure: ESOPHAGOGASTRODUODENOSCOPY (EGD) WITH PROPOFOL;  Surgeon: [name], MD;  Location: [facility] ENDOSCOPY;  Service: Endoscopy;  Laterality: N/A;   KNEE SURGERY Left 02/04/2016   [facility]   LEG AMPUTATION THROUGH LOWER TIBIA AND FIBULA Left 06/22/2018   [facility]   LOWER EXTREMITY ANGIOGRAPHY Right 08/08/2017   Procedure: Lower Extremity Angiography;  Surgeon: Algernon Huxley, MD;  Location: [place] CV LAB;  Service: Cardiovascular;  Laterality: Right;   LOWER EXTREMITY ANGIOGRAPHY Right 08/22/2017   Procedure: Lower Extremity Angiography;  Surgeon: Algernon Huxley, MD;  Location: [place] CV LAB;  Service: Cardiovascular;  Laterality: Right;   LOWER EXTREMITY INTERVENTION  08/08/2017   Procedure: LOWER EXTREMITY INTERVENTION;  Surgeon: Algernon Huxley, MD;  Location: [employer] CV LAB;  Service: Cardiovascular;;   LOWER EXTREMITY INTERVENTION  08/22/2017   Procedure: LOWER EXTREMITY INTERVENTION;  Surgeon: Algernon Huxley, MD;  Location: [place] CV LAB;  Service: Cardiovascular;;    Family History  Problem Relation Age of Onset   Irritable bowel syndrome Sister    Diabetes Sister    Heart disease Mother    Diabetes Mother    Heart disease Father    Rheumatic fever Father        as child   Psoriasis Brother    Arthritis Brother    Diabetes Sister    Diabetes Sister     Social History Social History   Tobacco Use   Smoking status: Former   Smokeless tobacco: Never   Tobacco comments:    smokes marijuana  Vaping Use   Vaping Use: Never used  Substance Use Topics   Alcohol use: No   Drug use: Yes    Types: Marijuana    Allergies  Allergen Reactions   Methotrexate Other (See Comments)    Blood count drops   Vancomycin Shortness Of Breath    Eyes watering, SOB, wheezing   Cefepime Other (See Comments)    Confusion     Tape     Current Facility-Administered Medications  Medication Dose Route Frequency Provider Last Rate Last Admin   0.9 %  sodium chloride infusion (Manually program via Guardrails IV Fluids)   Intravenous Once Wieting, Richard, MD       0.9 %  sodium chloride infusion  100 mL Intravenous PRN Breeze, Shantelle, NP       0.9 %  sodium chloride infusion  100 mL Intravenous PRN Breeze, Shantelle, NP       0.9 %  sodium chloride infusion   Intravenous PRN Agbata, Tochukwu,  MD 10 mL/hr at 11/06/21 2233 New Bag at 11/06/21 2233   acetaminophen (TYLENOL) tablet 1,000 mg  1,000 mg Oral Daily PRN Agbata, Tochukwu, MD       acetaminophen (TYLENOL) tablet 650 mg  650 mg Oral Once Loletha Grayer, MD       alteplase (CATHFLO ACTIVASE) injection 2 mg  2 mg Intracatheter Once PRN Colon Flattery, NP       amLODipine (NORVASC) tablet 10 mg  10 mg Oral QPM Agbata, Tochukwu, MD   10 mg at 11/06/21 2217   atorvastatin (LIPITOR) tablet 80 mg  80 mg Oral Daily Agbata, Tochukwu, MD   80 mg at 11/07/21 1150   carvedilol (COREG) tablet 25 mg  25 mg Oral BID Agbata, Tochukwu, MD   25 mg at 11/07/21 1150   ceFEPIme (MAXIPIME) 1 g in sodium chloride 0.9 % 100 mL IVPB  1 g Intravenous Once Darnelle Bos, RPH       Chlorhexidine Gluconate Cloth 2 % PADS 6 each  6 each Topical Q0600 Breeze, Shantelle, NP       doxycycline (VIBRAMYCIN) 100 mg in sodium chloride 0.9 % 250 mL IVPB  100 mg Intravenous Q12H Agbata, Tochukwu, MD 125 mL/hr at 11/07/21 0900 100 mg at 11/07/21 0900   feeding supplement (ENSURE ENLIVE / ENSURE PLUS) liquid 237 mL  237 mL Oral TID BM Agbata, Tochukwu, MD   237 mL at 11/06/21 2226   feeding supplement (ENSURE ENLIVE / ENSURE PLUS) liquid 237 mL  237 mL Oral TID BM Agbata, Tochukwu, MD       furosemide (LASIX) injection 80 mg  80 mg Intravenous Once Wieting, Richard, MD       gabapentin (NEURONTIN) capsule 100 mg  100 mg Oral QHS Agbata, Tochukwu, MD   100 mg at 11/06/21 2217   heparin injection  1,000 Units  1,000 Units Dialysis PRN Colon Flattery, NP       hydrALAZINE (APRESOLINE) tablet 100 mg  100 mg Oral Q8H Agbata, Tochukwu, MD   100 mg at 11/07/21 0601   isosorbide mononitrate (IMDUR) 24 hr tablet 30 mg  30 mg Oral Daily Agbata, Tochukwu, MD   30 mg at 11/07/21 0601   levETIRAcetam (KEPPRA) tablet 750 mg  750 mg Oral Q1200 Agbata, Tochukwu, MD   750 mg at 11/07/21 1150   lidocaine (PF) (XYLOCAINE) 1 % injection 5 mL  5 mL Intradermal PRN Breeze, Benancio Deeds, NP       lidocaine-prilocaine (EMLA) cream 1 application  1 application Topical PRN Breeze, Shantelle, NP       lisinopril (ZESTRIL) tablet 40 mg  40 mg Oral Daily Agbata, Tochukwu, MD   40 mg at 11/07/21 1149   multivitamin (RENA-VIT) tablet 1 tablet  1 tablet Oral QHS Agbata, Tochukwu, MD   1 tablet at 11/06/21 2216   ondansetron (ZOFRAN) tablet 4 mg  4 mg Oral Q6H PRN Agbata, Tochukwu, MD       Or   ondansetron (ZOFRAN) injection 4 mg  4 mg Intravenous Q6H PRN Agbata, Tochukwu, MD       pentafluoroprop-tetrafluoroeth (GEBAUERS) aerosol 1 application  1 application Topical PRN Colon Flattery, NP       sevelamer carbonate (RENVELA) tablet 800 mg  800 mg Oral TID WC Agbata, Tochukwu, MD   800 mg at 11/07/21 1149   traMADol (ULTRAM) tablet 50 mg  50 mg Oral Q12H PRN Agbata, Tochukwu, MD   50 mg at 11/07/21 1303     Review of  Systems Full ROS  was asked and was negative except for the information on the HPI  Physical Exam Blood pressure (!) 141/77, pulse 70, temperature 98.8 F (37.1 C), temperature source Oral, resp. rate 20, height 5' 11"  (1.803 m), weight 61.6 kg, SpO2 97 %. CONSTITUTIONAL: Debilitated malnourished in no acute distress. EYES: Pupils are equal, round,  Sclera are non-icteric. EARS, NOSE, MOUTH AND THROAT: He is wearing a mask. Hearing is intact to voice. LYMPH NODES:  Lymph nodes in the neck are normal. RESPIRATORY:  Lungs are clear. There is normal respiratory effort, with equal breath sounds  bilaterally, and without pathologic use of accessory muscles. CARDIOVASCULAR: Heart is regular without murmurs, gallops, or rubs. GI: The abdomen is  soft, nontender, and nondistended. There are no palpable masses. There is no hepatosplenomegaly. There are normal bowel sounds in all quadrants. Rectal: There is evidence of bilateral and multiple hidradenitis pits on both gluteus left greater than the right.  There is evidence of chronic seropurulent drainage when squeezing the gluteus.  This is classical typical for chronic hidradenitis.  There is however no evidence of necrotizing infection or definitive abscess.  No evidence of rectal lesions or masses MUSCULOSKELETAL: Bilateral BKA stumps.  No evidence of infection. HE Does have AV fistula on the left upper arm SKIN: Turgor is good and there are no pathologic skin lesions or ulcers. NEUROLOGIC: Motor and sensation is grossly normal. Cranial nerves are grossly intact. PSYCH:  Oriented to person, place and time. Affect is normal.  Data Reviewed  I have personally reviewed the patient's imaging, laboratory findings and medical records.    Assessment/Plan 61 year old male with chronic hidradenitis of buttocks left worse than right.  There is multiple sinuses that continue to drain seropurulent fluid from hidradenitis pits without a definitive abscess.  For now we will recommend continuation of antibiotics.  No need for surgical intervention at this time.  Very difficult situation as hidradenitis is a very difficult disease to treat and unfortunately not even  immunosuppression  has been able to control this.   We will be available. Please note that I spent 75 minutes's encounter including personally reviewing imaging studies, examining the patient, counseling the patient placing orders and performing appropriate mentation.  Caroleen Hamman, MD FACS General Surgeon 11/07/2021, 2:57 PM

## 2021-11-07 NOTE — Progress Notes (Signed)
Patient ID: [name], male   DOB: 19-Sep-1961, 61 y.o.   MRN: [id number] [facility] PROGRESS NOTE  [name] KVQ:[id number] DOB: [birth year] DOA: 11/06/2021 PCP: [name], MD  HPI/Subjective: Patient seen early this morning secondary to hemoglobin being 6.9.  Patient denies any blood loss.  Came in with a low sugar.  Patient also has a buttock abscess.  Objective: Vitals:   11/07/21 0559 11/07/21 0719  BP: 139/78 (!) 141/77  Pulse: 71 70  Resp: 20 20  Temp: 99.3 F (37.4 C) 98.8 F (37.1 C)  SpO2: 98% 97%    Intake/Output Summary (Last 24 hours) at 11/07/2021 1335 Last data filed at 11/07/2021 0500 Gross per 24 hour  Intake 240 ml  Output 778 ml  Net -538 ml   Filed Weights   11/06/21 0958  Weight: 61.6 kg    ROS: Review of Systems  Respiratory:  Negative for shortness of breath.   Cardiovascular:  Negative for chest pain.  Gastrointestinal:  Negative for abdominal pain, nausea and vomiting.  Exam: Physical Exam HENT:     Head: Normocephalic.     Mouth/Throat:     Pharynx: No oropharyngeal exudate.  Eyes:     General: Lids are normal.     Conjunctiva/sclera: Conjunctivae normal.  Cardiovascular:     Rate and Rhythm: Normal rate and regular rhythm.     Heart sounds: Normal heart sounds, S1 normal and S2 normal.  Pulmonary:     Breath sounds: Normal breath sounds. No decreased breath sounds, wheezing, rhonchi or rales.  Abdominal:     Palpations: Abdomen is soft.     Tenderness: There is no abdominal tenderness.  Musculoskeletal:     Right upper leg: No swelling.     Left upper leg: No swelling.  Skin:    General: Skin is warm.     Comments: Area of fullness on his buttock when I pressed pus came out.  Neurological:     Mental Status: He is alert.     Comments: Answers all questions appropriately      Scheduled Meds:  sodium chloride   Intravenous Once   acetaminophen  650 mg Oral Once   amLODipine  10 mg Oral QPM    atorvastatin  80 mg Oral Daily   carvedilol  25 mg Oral BID   Chlorhexidine Gluconate Cloth  6 each Topical Q0600   feeding supplement  237 mL Oral TID BM   feeding supplement  237 mL Oral TID BM   furosemide  80 mg Intravenous Once   gabapentin  100 mg Oral QHS   hydrALAZINE  100 mg Oral Q8H   isosorbide mononitrate  30 mg Oral Daily   levETIRAcetam  750 mg Oral Q1200   lisinopril  40 mg Oral Daily   multivitamin  1 tablet Oral QHS   sevelamer carbonate  800 mg Oral TID WC   Continuous Infusions:  sodium chloride     sodium chloride     sodium chloride 10 mL/hr at 11/06/21 2233   ceFEPime (MAXIPIME) IV     doxycycline (VIBRAMYCIN) IV 100 mg (11/07/21 0900)    Assessment/Plan:  Type 2 diabetes mellitus with hyporglycemia.  Patient's hemoglobin A1c is 6.5.  Can watch without sliding scale at this point in time. Clinical sepsis, present on admission with leukocytosis hypothermia and infected hydradenitis suppurativa.  Appreciate general surgery consultation.  On IV doxycycline.  Wound culture.  Patient on [name] for hidradenitis suppurativa. Acute on chronic  anemia, anemia of chronic disease.  Hemoglobin 6.9.  We will transfuse 1 unit of packed red blood cells.  Benefits and risks of blood transfusion explained to patient. End-stage renal disease on dialysis Monday Wednesday Friday Essential hypertension on amlodipine Hyperlipidemia unspecified on atorvastatin History of seizure on Keppra Diabetic neuropathy on gabapentin Pressure ulcers, present on admission.  See full description below. Peripheral vascular disease with bilateral lower extremity BKA's.  Pressure Injury 05/20/21 Sacrum Medial Stage 2 -  Partial thickness loss of dermis presenting as a shallow open injury with a red, pink wound bed without slough. pink/red sloughing, superficial sin loss (Active)  05/20/21 0800  Location: Sacrum  Location Orientation: Medial  Staging: Stage 2 -  Partial thickness loss of dermis  presenting as a shallow open injury with a red, pink wound bed without slough.  Wound Description (Comments): pink/red sloughing, superficial sin loss  Present on Admission: Yes     Pressure Injury 07/27/21 Sacrum Stage 2 -  Partial thickness loss of dermis presenting as a shallow open injury with a red, pink wound bed without slough. 3x3cm stage 2 sacral decub present on admission (Active)  07/27/21 1330  Location: Sacrum  Location Orientation:   Staging: Stage 2 -  Partial thickness loss of dermis presenting as a shallow open injury with a red, pink wound bed without slough.  Wound Description (Comments): 3x3cm stage 2 sacral decub present on admission  Present on Admission: Yes     Pressure Injury Thigh Distal;Posterior;Right Stage 1 -  Intact skin with non-blanchable redness of a localized area usually over a bony prominence. (Active)     Location: Thigh  Location Orientation: Distal;Posterior;Right  Staging: Stage 1 -  Intact skin with non-blanchable redness of a localized area usually over a bony prominence.  Wound Description (Comments):   Present on Admission: Yes       Code Status:     Code Status Orders  (From admission, onward)           Start     Ordered   11/06/21 2012  Full code  Continuous        11/06/21 2011           Code Status History     Date Active Date Inactive Code Status Order ID Comments User Context   07/27/2021 1321 08/02/2021 2249 Full Code [id number]  Ivor Costa, MD ED   06/09/2021 1039 06/13/2021 2348 Full Code [id number]  Collier Bullock, MD ED   05/18/2021 0539 05/26/2021 2146 Full Code [id number]  Rise Patience, MD ED   05/12/2021 2306 05/15/2021 2240 Full Code [id number]  Clance Boll, MD ED   02/06/2021 2254 02/10/2021 2225 Full Code [id number]  Athena Masse, MD ED   11/18/2020 1508 11/25/2020 1534 Full Code [id number]  Ivor Costa, MD ED   09/12/2020 1917 09/14/2020 2156 Full Code [id number]  Para Skeans, MD ED   01/05/2019 2016  01/08/2019 1759 Full Code [id number]  Fritzi Mandes, MD Inpatient   12/04/2017 0511 12/12/2017 2355 Full Code [id number]  Salary, Avel Peace, MD Inpatient   08/22/2017 1244 08/22/2017 1800 Full Code [id number]  Algernon Huxley, MD Inpatient   04/21/2017 1819 04/25/2017 1915 Full Code [id number]  Dustin Flock, MD Inpatient   11/06/2015 2035 11/07/2015 1818 Full Code [id number]  Aldean Jewett, MD ED      Family Communication: Updated family on the phone Disposition Plan: Status is: Inpatient.  Continue IV antibiotics here and hopefully can switch over  to oral antibiotics soon.  Transfuse 1 unit of packed red blood cells today reassess hemoglobin tomorrow.  [place]  [facility] [employer]

## 2021-11-08 LAB — BASIC METABOLIC PANEL
Anion gap: 13 (ref 5–15)
BUN: 44 mg/dL — ABNORMAL HIGH (ref 6–20)
CO2: 29 mmol/L (ref 22–32)
Calcium: 9.1 mg/dL (ref 8.9–10.3)
Chloride: 95 mmol/L — ABNORMAL LOW (ref 98–111)
Creatinine, Ser: 3.71 mg/dL — ABNORMAL HIGH (ref 0.61–1.24)
GFR, Estimated: 18 mL/min — ABNORMAL LOW (ref >60)
Glucose, Bld: 159 mg/dL — ABNORMAL HIGH (ref 70–99)
Potassium: 4.4 mmol/L (ref 3.5–5.1)
Sodium: 137 mmol/L (ref 135–145)

## 2021-11-08 LAB — HEPATITIS B SURFACE ANTIBODY, QUANTITATIVE: Hep B S AB Quant (Post): 11.9 m[IU]/mL (ref >9.9)

## 2021-11-08 LAB — CBC
HCT: 26.3 % — ABNORMAL LOW (ref 39.0–52.0)
Hemoglobin: 8.2 g/dL — ABNORMAL LOW (ref 13.0–17.0)
MCH: 27.7 pg (ref 26.0–34.0)
MCHC: 31.2 g/dL (ref 30.0–36.0)
MCV: 88.9 fL (ref 80.0–100.0)
Platelets: 223 K/uL (ref 150–400)
RBC: 2.96 MIL/uL — ABNORMAL LOW (ref 4.22–5.81)
RDW: 16.6 % — ABNORMAL HIGH (ref 11.5–15.5)
WBC: 16.6 K/uL — ABNORMAL HIGH (ref 4.0–10.5)
nRBC: 0 % (ref 0.0–0.2)

## 2021-11-08 LAB — GLUCOSE, CAPILLARY
Glucose-Capillary: 182 mg/dL — ABNORMAL HIGH (ref 70–99)
Glucose-Capillary: 153 mg/dL — ABNORMAL HIGH (ref 70–99)
Glucose-Capillary: 151 mg/dL — ABNORMAL HIGH (ref 70–99)
Glucose-Capillary: 227 mg/dL — ABNORMAL HIGH (ref 70–99)
Glucose-Capillary: 176 mg/dL — ABNORMAL HIGH (ref 70–99)
Glucose-Capillary: 179 mg/dL — ABNORMAL HIGH (ref 70–99)
Glucose-Capillary: 205 mg/dL — ABNORMAL HIGH (ref 70–99)

## 2021-11-08 MED ORDER — CLONIDINE HCL 0.1 MG PO TABS
0.1000 mg | ORAL_TABLET | Freq: Two times a day (BID) | ORAL | Status: DC
  Administered 2021-11-08 – 2021-11-12 (×8): 0.1 mg via ORAL
  Filled 2021-11-08 (×10): qty 1

## 2021-11-08 MED ORDER — EPOETIN ALFA 10000 UNIT/ML IJ SOLN
8000.0000 [IU] | INTRAMUSCULAR | Status: DC
  Administered 2021-11-09: 8000 [IU] via INTRAVENOUS

## 2021-11-08 NOTE — Progress Notes (Signed)
Patient ID: [name], male   DOB: 1961-01-05, 61 y.o.   MRN: [id number] [facility] PROGRESS NOTE  Kahron Kauth Placido SRP:[id number] DOB: 12/12/1960 DOA: 11/06/2021 PCP: [name], MD  HPI/Subjective: Patient feeling better today.  Was sitting up in bed when I saw him.  Patient admitted initially with hypoglycemia and medical sepsis.  Patient's buttocks is very sore.  Objective: Vitals:   11/08/21 0541 11/08/21 0751  BP: (!) 176/84 (!) 171/95  Pulse: 74 83  Resp:  20  Temp:  98.5 F (36.9 C)  SpO2:  92%    Intake/Output Summary (Last 24 hours) at 11/08/2021 1431 Last data filed at 11/08/2021 1300 Gross per 24 hour  Intake 1036.31 ml  Output --  Net 1036.31 ml   Filed Weights   11/06/21 0958  Weight: 61.6 kg    ROS: Review of Systems  Respiratory:  Negative for shortness of breath.   Cardiovascular:  Negative for chest pain.  Gastrointestinal:  Negative for abdominal pain, nausea and vomiting.  Exam: Physical Exam HENT:     Head: Normocephalic.     Mouth/Throat:     Pharynx: No oropharyngeal exudate.  Eyes:     General: Lids are normal.     Conjunctiva/sclera: Conjunctivae normal.  Cardiovascular:     Rate and Rhythm: Normal rate and regular rhythm.     Heart sounds: Normal heart sounds, S1 normal and S2 normal.  Pulmonary:     Breath sounds: No decreased breath sounds, wheezing, rhonchi or rales.  Abdominal:     Palpations: Abdomen is soft.     Tenderness: There is no abdominal tenderness.  Musculoskeletal:     Right upper leg: No swelling.     Left upper leg: No swelling.  Skin:    General: Skin is warm.     Comments: Right posterior thigh just area of darkened skin.  No ulcer seen.  Unable to look into other areas secondary to stool covering the bandages I did not to open.  Neurological:     Mental Status: He is alert.     Comments: Answers all questions appropriately      Scheduled Meds:  amLODipine  10 mg Oral QPM   atorvastatin  80  mg Oral Daily   carvedilol  25 mg Oral BID   Chlorhexidine Gluconate Cloth  6 each Topical Q0600   [START ON 11/09/2021] epoetin (EPOGEN/PROCRIT) injection  8,000 Units Intravenous Q M,W,F-HD   feeding supplement  237 mL Oral TID BM   feeding supplement  237 mL Oral TID BM   gabapentin  100 mg Oral QHS   hydrALAZINE  100 mg Oral Q8H   isosorbide mononitrate  30 mg Oral Daily   levETIRAcetam  750 mg Oral Q1200   lisinopril  40 mg Oral Daily   multivitamin  1 tablet Oral QHS   sevelamer carbonate  800 mg Oral TID WC   Continuous Infusions:  sodium chloride     sodium chloride     sodium chloride 10 mL/hr at 11/06/21 2233   ceFEPime (MAXIPIME) IV     doxycycline (VIBRAMYCIN) IV 100 mg (11/08/21 0856)    Assessment/Plan:  Clinical sepsis, present on admission with leukocytosis, hypothermia and infected hydradenitis suppurativa.  Appreciate general surgery consultation.  On IV doxycycline.  Follow-up wound culture results.  Patient on [name] as outpatient weekly for control. Type 2 diabetes mellitus with hypoglycemia.  Patient's hemoglobin A1c is 6.5.  Can watch without sliding scale at this point  in time. Acute on chronic anemia with anemia of chronic disease.  Patient was given 1 unit of packed red blood cells yesterday on a hemoglobin of 6.9.  Hemoglobin came up to 8.2 today. End-stage renal disease on dialysis Monday Wednesday Friday Essential hypertension on amlodipine, Coreg, hydralazine, Imdur.  Restart clonidine. Hyperlipidemia unspecified on atorvastatin  Seizure disorder on Keppra Diabetic neuropathy on gabapentin Pressure ulcers, present on admission.  See full description below Peripheral vascular disease.  Bilateral lower extremity BKA's.  Pressure Injury 05/20/21 Sacrum Medial Stage 2 -  Partial thickness loss of dermis presenting as a shallow open injury with a red, pink wound bed without slough. pink/red sloughing, superficial sin loss (Active)  05/20/21 0800  Location:  Sacrum  Location Orientation: Medial  Staging: Stage 2 -  Partial thickness loss of dermis presenting as a shallow open injury with a red, pink wound bed without slough.  Wound Description (Comments): pink/red sloughing, superficial sin loss  Present on Admission: Yes     Pressure Injury 07/27/21 Sacrum Stage 2 -  Partial thickness loss of dermis presenting as a shallow open injury with a red, pink wound bed without slough. 3x3cm stage 2 sacral decub present on admission (Active)  07/27/21 1330  Location: Sacrum  Location Orientation:   Staging: Stage 2 -  Partial thickness loss of dermis presenting as a shallow open injury with a red, pink wound bed without slough.  Wound Description (Comments): 3x3cm stage 2 sacral decub present on admission  Present on Admission: Yes     Pressure Injury Thigh Distal;Posterior;Right Stage 1 -  Intact skin with non-blanchable redness of a localized area usually over a bony prominence. (Active)     Location: Thigh  Location Orientation: Distal;Posterior;Right  Staging: Stage 1 -  Intact skin with non-blanchable redness of a localized area usually over a bony prominence.  Wound Description (Comments):   Present on Admission: Yes       Code Status:     Code Status Orders  (From admission, onward)           Start     Ordered   11/06/21 2012  Full code  Continuous        11/06/21 2011           Code Status History     Date Active Date Inactive Code Status Order ID Comments User Context   07/27/2021 1321 08/02/2021 2249 Full Code [id number]  Ivor Costa, MD ED   06/09/2021 1039 06/13/2021 2348 Full Code [id number]  Collier Bullock, MD ED   05/18/2021 0539 05/26/2021 2146 Full Code [id number]  Rise Patience, MD ED   05/12/2021 2306 05/15/2021 2240 Full Code [id number]  Clance Boll, MD ED   02/06/2021 2254 02/10/2021 2225 Full Code [id number]  Athena Masse, MD ED   11/18/2020 1508 11/25/2020 1534 Full Code [id number]  Ivor Costa, MD ED    09/12/2020 1917 09/14/2020 2156 Full Code [id number]  Para Skeans, MD ED   01/05/2019 2016 01/08/2019 1759 Full Code [id number]  Fritzi Mandes, MD Inpatient   12/04/2017 0511 12/12/2017 2355 Full Code [id number]  Salary, Avel Peace, MD Inpatient   08/22/2017 1244 08/22/2017 1800 Full Code [id number]  Algernon Huxley, MD Inpatient   04/21/2017 1819 04/25/2017 1915 Full Code [id number]  Dustin Flock, MD Inpatient   11/06/2015 2035 11/07/2015 1818 Full Code [id number]  Aldean Jewett, MD ED      Family Communication: Spoke with wife at the bedside we recommended  Disposition Plan: Status is: Inpatient  Potential back to nursing facility on we will Monday afternoon versus Tuesday, depending on his dialysis finishes tomorrow.  [place]  [facility] [employer]

## 2021-11-08 NOTE — Progress Notes (Signed)
pt was short of breath, 89% on 2 liters had to increawhen I got here this pt was short of breath, 89% on 2 liters had to increase to 5, his blood glucose was good 176, now he is diaphoretic and vomiting, he came in with Sepsis, dialysis pt, and received blood on Saturday, only c/o that he is hot, will notify on call provider.

## 2021-11-08 NOTE — Progress Notes (Signed)
[facility]  ROUNDING NOTE   Subjective:   [name] is a 61 y.o. male with a past medical history of anemia, diabetes, hypertension, stroke, bilateral BKA, chronic systolic heart failure, and end stage renal disease on dialysis. Patient presents from [facility] with reported glucose of 39. He is also found to have increased WBCs on labs.   Patient found resting in bed, getting attended by nursing staff, He denies SOB,nausea, vomiting.   Received blood transfusion yesterday.  Today's hemoglobin is 8.2. Was seen by general surgeon for chronic hidradenitis in both buttocks    Objective:  Vital signs in last 24 hours:  Temp:  [97.7 F (36.5 C)-99 F (37.2 C)] 98.5 F (36.9 C) (01/22 0751) Pulse Rate:  [67-83] 83 (01/22 0751) Resp:  [18-20] 20 (01/22 0751) BP: (127-176)/(50-95) 171/95 (01/22 0751) SpO2:  [92 %-100 %] 92 % (01/22 0751)  Weight change:  Filed Weights   11/06/21 0958  Weight: 61.6 kg    Intake/Output: I/O last 3 completed shifts: In: 1156.3 [P.O.:360; Blood:296; IV Piggyback:500.3] Out: 0    Intake/Output this shift:  No intake/output data recorded.  Physical Exam: General: In no acute distress  Head: Moist oral mucosal membranes  Eyes: Anicteric  Lungs:  Normal effort and symmetrical respiratory effort, lungs clear  Heart: S1S2,no rubs or gallops  Abdomen:  Soft, nontender,non distended  Extremities: No peripheral edema.Bilateral BKA  Neurologic: Awake,alert,oriented  Skin: No lesions or rashes  Access: Left AVF    Basic Metabolic Panel: Recent Labs  Lab 11/06/21 1008 11/07/21 0459 11/08/21 0822  NA 134* 136 137  K 4.3 3.6 4.4  CL 94* 95* 95*  CO2 29 29 29   GLUCOSE 132* 150* 159*  BUN 43* 27* 44*  CREATININE 4.16* 2.73* 3.71*  CALCIUM 9.2 8.8* 9.1     Liver Function Tests: Recent Labs  Lab 11/06/21 1008  AST 15  ALT 9  ALKPHOS 80  BILITOT 0.2*  PROT 7.1  ALBUMIN 2.2*    No results for input(s): LIPASE,  AMYLASE in the last 168 hours. No results for input(s): AMMONIA in the last 168 hours.  CBC: Recent Labs  Lab 11/06/21 1008 11/07/21 0459 11/08/21 0822  WBC 19.8* 12.2* 16.6*  NEUTROABS 18.4*  --   --   HGB 7.5* 6.9* 8.2*  HCT 24.8* 22.6* 26.3*  MCV 91.9 90.4 88.9  PLT 205 195 223     Cardiac Enzymes: No results for input(s): CKTOTAL, CKMB, CKMBINDEX, TROPONINI in the last 168 hours.  BNP: Invalid input(s): POCBNP  CBG: Recent Labs  Lab 11/07/21 1606 11/07/21 2146 11/08/21 0327 11/08/21 0754 11/08/21 1125  GLUCAP 183* 221* 182* 153* 151*     Microbiology: Results for orders placed or performed during the hospital encounter of 11/06/21  Resp Panel by RT-PCR (Flu A&B, Covid) Nasopharyngeal Swab     Status: None   Collection Time: 11/06/21 11:14 AM   Specimen: Nasopharyngeal Swab; Nasopharyngeal(NP) swabs in vial transport medium  Result Value Ref Range Status   SARS Coronavirus 2 by RT PCR NEGATIVE NEGATIVE Final    Comment: (NOTE) SARS-CoV-2 target nucleic acids are NOT DETECTED.  The SARS-CoV-2 RNA is generally detectable in upper respiratory specimens during the acute phase of infection. The lowest concentration of SARS-CoV-2 viral copies this assay can detect is 138 copies/mL. A negative result does not preclude SARS-Cov-2 infection and should not be used as the sole basis for treatment or other patient management decisions. A negative result may  occur with  improper specimen collection/handling, submission of specimen other than nasopharyngeal swab, presence of viral mutation(s) within the areas targeted by this assay, and inadequate number of viral copies(<138 copies/mL). A negative result must be combined with clinical observations, patient history, and epidemiological information. The expected result is Negative.  Fact Sheet for Patients:  EntrepreneurPulse.com.au  Fact Sheet for Healthcare Providers:   IncredibleEmployment.be  This test is no t yet approved or cleared by the [place] FDA and  has been authorized for detection and/or diagnosis of SARS-CoV-2 by FDA under an Emergency Use Authorization (EUA). This EUA will remain  in effect (meaning this test can be used) for the duration of the COVID-19 declaration under Section 564(b)(1) of the Act, 21 U.S.C.section 360bbb-3(b)(1), unless the authorization is terminated  or revoked sooner.       Influenza A by PCR NEGATIVE NEGATIVE Final   Influenza B by PCR NEGATIVE NEGATIVE Final    Comment: (NOTE) The Xpert Xpress SARS-CoV-2/FLU/RSV plus assay is intended as an aid in the diagnosis of influenza from Nasopharyngeal swab specimens and should not be used as a sole basis for treatment. Nasal washings and aspirates are unacceptable for Xpert Xpress SARS-CoV-2/FLU/RSV testing.  Fact Sheet for Patients: EntrepreneurPulse.com.au  Fact Sheet for Healthcare Providers: IncredibleEmployment.be  This test is not yet approved or cleared by the [place] FDA and has been authorized for detection and/or diagnosis of SARS-CoV-2 by FDA under an Emergency Use Authorization (EUA). This EUA will remain in effect (meaning this test can be used) for the duration of the COVID-19 declaration under Section 564(b)(1) of the Act, 21 U.S.C. section 360bbb-3(b)(1), unless the authorization is terminated or revoked.  Performed at [facility], [address] [place], [place] [address]   Culture, blood (single)     Status: None (Preliminary result)   Collection Time: 11/06/21 12:21 PM   Specimen: BLOOD  Result Value Ref Range Status   Specimen Description BLOOD BLOOD RIGHT ARM  Final   Special Requests   Final    BOTTLES DRAWN AEROBIC AND ANAEROBIC Blood Culture results may not be optimal due to an excessive volume of blood received in culture bottles   Culture   Final    NO  GROWTH 2 DAYS Performed at [facility], [address], [place], [place] [address]    Report Status PENDING  Incomplete  MRSA Next Gen by PCR, Nasal     Status: None   Collection Time: 11/07/21  5:08 AM   Specimen: Nasal Mucosa; Nasal Swab  Result Value Ref Range Status   MRSA by PCR Next Gen NOT DETECTED NOT DETECTED Final    Comment: (NOTE) The GeneXpert MRSA Assay (FDA approved for NASAL specimens only), is one component of a comprehensive MRSA colonization surveillance program. It is not intended to diagnose MRSA infection nor to guide or monitor treatment for MRSA infections. Test performance is not FDA approved in patients less than 97 years old. Performed at [facility], [address] [place], [place] [address]   Aerobic/Anaerobic Culture w Gram Stain (surgical/deep wound)     Status: None (Preliminary result)   Collection Time: 11/07/21 11:30 AM   Specimen: Buttocks; Abscess  Result Value Ref Range Status   Specimen Description   Final    BUTTOCKS Performed at [facility], [address] [place], [place] [address]    Special Requests   Final    Normal Performed at [facility], [address],  [place], [place] [address]    Gram Stain PENDING  Incomplete   Culture   Final    CULTURE REINCUBATED FOR BETTER GROWTH Performed at [facility], [place] [address], [place], [place] [address]    Report Status PENDING  Incomplete    Coagulation Studies: Recent Labs    11/07/21 0459  LABPROT 14.8  INR 1.2     Urinalysis: No results for input(s): COLORURINE, LABSPEC, PHURINE, GLUCOSEU, HGBUR, BILIRUBINUR, KETONESUR, PROTEINUR, UROBILINOGEN, NITRITE, LEUKOCYTESUR in the last 72 hours.  Invalid input(s): APPERANCEUR    Imaging: CT PELVIS W CONTRAST  Result Date: 11/06/2021 CLINICAL DATA:  History of hidradenitis, meeting sepsis criteria, pain from buttock wound, evaluate for underlying abscess EXAM: CT PELVIS WITH  CONTRAST TECHNIQUE: Multidetector CT imaging of the pelvis was performed using the standard protocol following the bolus administration of intravenous contrast. RADIATION DOSE REDUCTION: This exam was performed according to the departmental dose-optimization program which includes automated exposure control, adjustment of the mA and/or kV according to patient size and/or use of iterative reconstruction technique. CONTRAST:  85mL OMNIPAQUE IOHEXOL 300 MG/ML  SOLN COMPARISON:  07/27/2021 FINDINGS: Urinary Tract:  No abnormality visualized. Bowel:  Large burden of stool in the distal colon and rectum. Vascular/Lymphatic: Numerous enlarged bilateral inguinal lymph nodes, measuring up to 2.0 x 0.9 cm on the left (series 2, image 41) aortic atherosclerosis. Extensive vascular calcinosis. Reproductive:  No mass or other significant abnormality Other: Very extensive soft tissue thickening about the bilateral buttocks, anus, medial thighs, and groin, similar in appearance to prior examination, somewhat asymmetrically worse on the left. There is no discrete fluid collection appreciated. Musculoskeletal: No suspicious bone lesions identified. IMPRESSION: 1. Very extensive subcutaneous soft tissue thickening about the bilateral buttocks, anus, medial thighs, and groin, similar in appearance to prior examination and in keeping with hidradenitis, somewhat asymmetrically worse on the left. There is no discrete fluid collection appreciated. 2. Numerous enlarged bilateral inguinal lymph nodes, likely reactive to infection. 3. Large burden of stool in the distal colon and rectum. Aortic Atherosclerosis (ICD10-I70.0). Electronically Signed   By: Delanna Ahmadi M.D.   On: 11/06/2021 12:50     Medications:    sodium chloride     sodium chloride     sodium chloride 10 mL/hr at 11/06/21 2233   ceFEPime (MAXIPIME) IV     doxycycline (VIBRAMYCIN) IV 100 mg (11/08/21 0856)    amLODipine  10 mg Oral QPM   atorvastatin  80 mg Oral  Daily   carvedilol  25 mg Oral BID   Chlorhexidine Gluconate Cloth  6 each Topical Q0600   feeding supplement  237 mL Oral TID BM   feeding supplement  237 mL Oral TID BM   gabapentin  100 mg Oral QHS   hydrALAZINE  100 mg Oral Q8H   isosorbide mononitrate  30 mg Oral Daily   levETIRAcetam  750 mg Oral Q1200   lisinopril  40 mg Oral Daily   multivitamin  1 tablet Oral QHS   sevelamer carbonate  800 mg Oral TID WC     Assessment/ Plan:  [name] is a 61 y.o.  male  with a past medical history of anemia, diabetes, hypertension, stroke, bilateral BKA, chronic systolic heart failure, and end stage renal disease on dialysis. Patient presents from [facility] with reported glucose of 39. He is also found to have increased WBCs on labs.  [facility] [address] MWF L AVF /61 kg  #End-stage  renal disease on hemodialysis.  Will maintain outpatient schedule during this admission.   Next hemodialysis to be scheduled for Monday  #Diabetes mellitus type II with chronic kidney disease insulin dependent. Home regimen includes Lantus, NovoLog, and [name].  Lab Results  Component Value Date   HGBA1C 6.5 (H) 07/29/2021    Hypoglycemic on EMS arrival to 39.     #Anemia of chronic kidney disease  Lab Results  Component Value Date   HGB 8.2 (L) 11/08/2021   Patient received blood transfusion this admission. Continue ESA therapy   #Secondary Hyperparathyroidism  Lab Results  Component Value Date   PTH 283 (H) 01/08/2019   CALCIUM 9.1 11/08/2021   CAION 1.10 (L) 09/12/2020   PHOS 6.9 (H) 05/19/2021   On Sevelamer 800 mg TID  #Hidradenitis of the buttocks. Chronic seropurulent drainage Currently being treated with broad-spectrum antibiotics.   LOS: 2 Lorrene Graef 1/22/202312:28 PM

## 2021-11-08 NOTE — Progress Notes (Signed)
Patient awake all night with multiple request and demands. Noted with several bowel movements. Blood pressure elevated 176/84. Oncall provider made aware. Will endorse for oncoming RN to recheck and follow up with day shift MD. Sacral patches changed. Safety maintained, bed kept in lowest position, call bell kept within reach. Will endorse to oncoming nurse.

## 2021-11-09 ENCOUNTER — Inpatient Hospital Stay: Payer: Medicare Other

## 2021-11-09 DIAGNOSIS — J189 Pneumonia, unspecified organism: Secondary | ICD-10-CM

## 2021-11-09 DIAGNOSIS — J9601 Acute respiratory failure with hypoxia: Secondary | ICD-10-CM

## 2021-11-09 DIAGNOSIS — E1140 Type 2 diabetes mellitus with diabetic neuropathy, unspecified: Secondary | ICD-10-CM

## 2021-11-09 DIAGNOSIS — G9341 Metabolic encephalopathy: Secondary | ICD-10-CM

## 2021-11-09 LAB — BASIC METABOLIC PANEL
Anion gap: 15 (ref 5–15)
BUN: 62 mg/dL — ABNORMAL HIGH (ref 6–20)
CO2: 24 mmol/L (ref 22–32)
Calcium: 9.1 mg/dL (ref 8.9–10.3)
Chloride: 96 mmol/L — ABNORMAL LOW (ref 98–111)
Creatinine, Ser: 4.77 mg/dL — ABNORMAL HIGH (ref 0.61–1.24)
GFR, Estimated: 13 mL/min — ABNORMAL LOW (ref >60)
Glucose, Bld: 240 mg/dL — ABNORMAL HIGH (ref 70–99)
Potassium: 4.9 mmol/L (ref 3.5–5.1)
Sodium: 135 mmol/L (ref 135–145)

## 2021-11-09 LAB — CBC
HCT: 27.2 % — ABNORMAL LOW (ref 39.0–52.0)
Hemoglobin: 8.6 g/dL — ABNORMAL LOW (ref 13.0–17.0)
MCH: 28.1 pg (ref 26.0–34.0)
MCHC: 31.6 g/dL (ref 30.0–36.0)
MCV: 88.9 fL (ref 80.0–100.0)
Platelets: 206 10*3/uL (ref 150–400)
RBC: 3.06 MIL/uL — ABNORMAL LOW (ref 4.22–5.81)
RDW: 17.1 % — ABNORMAL HIGH (ref 11.5–15.5)
WBC: 21.5 10*3/uL — ABNORMAL HIGH (ref 4.0–10.5)
nRBC: 0 % (ref 0.0–0.2)

## 2021-11-09 LAB — GLUCOSE, CAPILLARY
Glucose-Capillary: 153 mg/dL — ABNORMAL HIGH (ref 70–99)
Glucose-Capillary: 161 mg/dL — ABNORMAL HIGH (ref 70–99)
Glucose-Capillary: 208 mg/dL — ABNORMAL HIGH (ref 70–99)
Glucose-Capillary: 232 mg/dL — ABNORMAL HIGH (ref 70–99)
Glucose-Capillary: 94 mg/dL (ref 70–99)

## 2021-11-09 MED ORDER — LEVOFLOXACIN 500 MG PO TABS
500.0000 mg | ORAL_TABLET | ORAL | Status: DC
  Administered 2021-11-09 – 2021-11-11 (×2): 500 mg via ORAL
  Filled 2021-11-09 (×2): qty 1

## 2021-11-09 MED ORDER — INSULIN ASPART 100 UNIT/ML IJ SOLN
2.0000 [IU] | Freq: Three times a day (TID) | INTRAMUSCULAR | Status: DC
  Administered 2021-11-10 – 2021-11-12 (×4): 2 [IU] via SUBCUTANEOUS
  Filled 2021-11-09 (×4): qty 1

## 2021-11-09 MED ORDER — FUROSEMIDE 10 MG/ML IJ SOLN
60.0000 mg | INTRAMUSCULAR | Status: AC
  Administered 2021-11-09: 60 mg via INTRAVENOUS
  Filled 2021-11-09: qty 8

## 2021-11-09 MED ORDER — PIPERACILLIN-TAZOBACTAM IN DEX 2-0.25 GM/50ML IV SOLN
2.2500 g | Freq: Three times a day (TID) | INTRAVENOUS | Status: DC
  Administered 2021-11-09: 2.25 g via INTRAVENOUS
  Filled 2021-11-09 (×3): qty 50

## 2021-11-09 MED ORDER — EPOETIN ALFA 4000 UNIT/ML IJ SOLN
INTRAMUSCULAR | Status: AC
  Filled 2021-11-09: qty 2

## 2021-11-09 NOTE — Progress Notes (Signed)
° °      CROSS COVER NOTE  NAME: [name] MRN: [id number] DOB : March 30, 1961   Secure chat received from RN stating "when I got here this pt was short of breath, 89% on 2 liters had to increase to 5, his blood glucose was good 176, now he is diaphoretic and vomiting, he came in with Sepsis, dialysis pt, and received blood on Saturday, only c/o that he is hot, puzzling". Patient admitted with sepsis secondary to cellulitis of the buttocks and sacrum in the setting of hidradenitis.  Patient is currently afebrile, leukocytosis to 16.6 on 11/08/21 AM increased from 12.2 the previous morning.  Patient currently being covered with cefepime and doxycycline.  Hemoglobin 8.2 on 11/08/21 AM after being transfused the previous day.  Will obtain EKG, chest x-ray, and AM labs.  - EKG shows NSR with LVH - CXR shows worsening pneumonia - AM labs pending

## 2021-11-09 NOTE — Progress Notes (Signed)
Patient ID: [name], male   DOB: 15-May-1961, 60 y.o.   MRN: [id number] [facility] PROGRESS NOTE  Shana Younge Arizpe FOY:[id number] DOB: [birth year] DOA: 11/06/2021 PCP: [name], MD  HPI/Subjective: Patient seen briefly as they were wheeling him down to dialysis.  I saw him down at dialysis.  He required some oxygen secondary to difficulty breathing last night.  Not much cough.  As per patient's wife he was hallucinating yesterday and was confused.  Objective: Vitals:   11/09/21 1252 11/09/21 1300  BP: (!) 162/81 (!) 181/89  Pulse: 84 86  Resp: 20 (!) 21  Temp: 98.7 F (37.1 C)   SpO2: 97% 97%    Intake/Output Summary (Last 24 hours) at 11/09/2021 1545 Last data filed at 11/09/2021 1245 Gross per 24 hour  Intake 502.82 ml  Output 2500 ml  Net -1997.18 ml   Filed Weights   11/06/21 0958 11/09/21 0928 11/09/21 1252  Weight: 61.6 kg 63.1 kg 60.1 kg    ROS: Review of Systems  Respiratory:  Positive for cough and shortness of breath.   Cardiovascular:  Negative for chest pain.  Gastrointestinal:  Negative for abdominal pain, nausea and vomiting.  Exam: Physical Exam HENT:     Head: Normocephalic.     Mouth/Throat:     Pharynx: No oropharyngeal exudate.  Eyes:     General: Lids are normal.     Conjunctiva/sclera: Conjunctivae normal.  Cardiovascular:     Rate and Rhythm: Normal rate and regular rhythm.     Heart sounds: Normal heart sounds, S1 normal and S2 normal.  Pulmonary:     Breath sounds: Examination of the right-lower field reveals decreased breath sounds and rhonchi. Examination of the left-lower field reveals decreased breath sounds and rhonchi. Decreased breath sounds and rhonchi present. No wheezing or rales.  Abdominal:     Palpations: Abdomen is soft.     Tenderness: There is no abdominal tenderness.  Musculoskeletal:     Right upper leg: No swelling.     Left upper leg: No swelling.  Skin:    General: Skin is warm.     Comments: Wounds  on buttock were covered since he was at dialysis today  Neurological:     Mental Status: He is alert.     Comments: For me today he answered questions appropriately      Scheduled Meds:  amLODipine  10 mg Oral QPM   atorvastatin  80 mg Oral Daily   carvedilol  25 mg Oral BID   cloNIDine  0.1 mg Oral BID   epoetin alfa       epoetin (EPOGEN/PROCRIT) injection  8,000 Units Intravenous Q M,W,F-HD   feeding supplement  237 mL Oral TID BM   feeding supplement  237 mL Oral TID BM   gabapentin  100 mg Oral QHS   hydrALAZINE  100 mg Oral Q8H   isosorbide mononitrate  30 mg Oral Daily   levETIRAcetam  750 mg Oral Q1200   lisinopril  40 mg Oral Daily   multivitamin  1 tablet Oral QHS   sevelamer carbonate  800 mg Oral TID WC   Continuous Infusions:  sodium chloride 10 mL/hr at 11/06/21 2233   doxycycline (VIBRAMYCIN) IV 100 mg (11/09/21 0800)   piperacillin-tazobactam (ZOSYN)  IV 2.25 g (11/09/21 1457)    Assessment/Plan:  Acute metabolic encephalopathy, chest x-ray with worsening pneumonia.  White blood cell count increased to 21.5.  Added Zosyn to doxycycline.  Reevaluate tomorrow.  Check  CBC tomorrow. Acute hypoxic respiratory failure.  Patient had a pulse ox of 86% on room air and required to be placed on oxygen. Clinical sepsis, blazon on admission with leukocytosis hypothermia infected hidradenitis suppurativa.  Patient on doxycycline for now.  Patient on [name] as outpatient. Acute on chronic anemia.  The patient was given 1 unit of packed red blood cells during the hospital course for hemoglobin of 6.9.  Today's hemoglobin 8.6. End-stage renal disease on dialysis Monday Wednesday Friday.  Seen on dialysis today. Essential hypertension on amlodipine, Coreg hydralazine Imdur and clonidine. Hyperlipidemia unspecified on atorvastatin Seizure disorder on Keppra Type 2 diabetes with diabetic neuropathy on gabapentin.  The patient had hypoglycemia upon coming into the hospital.  Since  sugars now in the 200s we will add 2 units of NovoLog with meals. Pressure ulcers, present on admission.  See full description below Peripheral vascular disease.  Patient has bilateral lower extremity BKA's.  Pressure Injury 05/20/21 Sacrum Medial Stage 2 -  Partial thickness loss of dermis presenting as a shallow open injury with a red, pink wound bed without slough. pink/red sloughing, superficial sin loss (Active)  05/20/21 0800  Location: Sacrum  Location Orientation: Medial  Staging: Stage 2 -  Partial thickness loss of dermis presenting as a shallow open injury with a red, pink wound bed without slough.  Wound Description (Comments): pink/red sloughing, superficial sin loss  Present on Admission: Yes     Pressure Injury 07/27/21 Sacrum Stage 2 -  Partial thickness loss of dermis presenting as a shallow open injury with a red, pink wound bed without slough. 3x3cm stage 2 sacral decub present on admission (Active)  07/27/21 1330  Location: Sacrum  Location Orientation:   Staging: Stage 2 -  Partial thickness loss of dermis presenting as a shallow open injury with a red, pink wound bed without slough.  Wound Description (Comments): 3x3cm stage 2 sacral decub present on admission  Present on Admission: Yes     Pressure Injury Thigh Distal;Posterior;Right Stage 1 -  Intact skin with non-blanchable redness of a localized area usually over a bony prominence. (Active)     Location: Thigh  Location Orientation: Distal;Posterior;Right  Staging: Stage 1 -  Intact skin with non-blanchable redness of a localized area usually over a bony prominence.  Wound Description (Comments):   Present on Admission: Yes       Code Status:     Code Status Orders  (From admission, onward)           Start     Ordered   11/06/21 2012  Full code  Continuous        11/06/21 2011           Code Status History     Date Active Date Inactive Code Status Order ID Comments User Context    07/27/2021 1321 08/02/2021 2249 Full Code [id number]  Ivor Costa, MD ED   06/09/2021 1039 06/13/2021 2348 Full Code [id number]  Collier Bullock, MD ED   05/18/2021 0539 05/26/2021 2146 Full Code [id number]  Rise Patience, MD ED   05/12/2021 2306 05/15/2021 2240 Full Code [id number]  Clance Boll, MD ED   02/06/2021 2254 02/10/2021 2225 Full Code [id number]  Athena Masse, MD ED   11/18/2020 1508 11/25/2020 1534 Full Code [id number]  Ivor Costa, MD ED   09/12/2020 1917 09/14/2020 2156 Full Code [id number]  Para Skeans, MD ED   01/05/2019 2016 01/08/2019 1759 Full Code [id number]  Fritzi Mandes, MD  Inpatient   12/04/2017 0511 12/12/2017 2355 Full Code [id number]  Gorden Harms, MD Inpatient   08/22/2017 1244 08/22/2017 1800 Full Code [id number]  Algernon Huxley, MD Inpatient   04/21/2017 1819 04/25/2017 1915 Full Code [id number]  Dustin Flock, MD Inpatient   11/06/2015 2035 11/07/2015 1818 Full Code [id number]  Aldean Jewett, MD ED      Family Communication: Updated patient's wife on the phone Disposition Plan: Status is: [place]  [facility]

## 2021-11-09 NOTE — Progress Notes (Signed)
Patient ID: [name], male   DOB: 1961-05-19, 61 y.o.   MRN: [id number]  Notified by nursing staff that his facility had a Legionella outbreak.  We will change antibiotics over to Levaquin p.o.  Dr Loletha Grayer

## 2021-11-09 NOTE — NC FL2 (Signed)
[place] LEVEL OF CARE SCREENING TOOL     IDENTIFICATION  Patient Name: [name] Birthdate: 12-30-1960 Sex: male Admission Date (Current Location): 11/06/2021  [facility] [place] Number:  Engineering geologist and Address:         Provider Number: [id number]  Attending Physician Name and Address:  Loletha Grayer, MD  Relative Name and Phone Number:       Current Level of Care: Hospital Recommended Level of Care: [place] Prior Approval Number:    Date Approved/Denied:   PASRR Number: [id number]  Discharge Plan: SNF    Current Diagnoses: Patient Active Problem List   Diagnosis Date Noted   Type II diabetes mellitus with renal manifestations ([place]) 07/27/2021   Anemia in ESRD (end-stage renal disease) ([place]) 07/27/2021   Hypoglycemia 07/27/2021   Hypothermia 07/27/2021   COVID-19 virus infection 07/27/2021   Aspiration pneumonia ([place]) 07/27/2021   Unstageable pressure ulcer of sacral region ([place]) 05/22/2021   Acute decompensated heart failure ([place]) 05/19/2021   Acute respiratory failure with hypoxia ([place]) 05/18/2021   Protein-calorie malnutrition, severe 05/14/2021   Acute pulmonary edema ([place])    Shortness of breath    Fluid overload 05/12/2021   Cellulitis and abscess of buttock    History of GI bleed 02/06/2021   Long term current use of immunosuppressive drug 02/06/2021   Disorder of skin due to Crohn's disease ([place]) 10/26/3233   Complication of vascular access for dialysis 01/22/2021   Polyp of transverse colon    Acute metabolic encephalopathy 57/32/2025   Acute on chronic anemia 42/70/6237   Chronic systolic CHF (congestive heart failure) ([place]) 11/18/2020   Cerebrovascular disease 09/12/2020   Transient alteration of awareness 09/12/2020   Hyperkalemia 09/12/2020   Mild cognitive impairment 09/30/2019   Peripheral vascular disease ([place]) 06/28/2018   Sepsis ([place]) 01/12/2018   Pressure injury of skin 12/04/2017    S/P bilateral BKA (below knee amputation) ([place]) 10/20/2017   Atherosclerosis of native arteries of extremity with rest pain ([place]) 08/05/2017   History of CVA (cerebrovascular accident) 04/15/2017   Seizure disorder ([place]) 04/15/2017   ESRD (end stage renal disease) ([place]) 04/12/2016   Aphthae 02/20/2016   Hidradenitis suppurativa 02/20/2016   Leg pain 02/20/2016   Neuropathy 02/20/2016   Narrowing of intervertebral disc space 08/29/2015   Vascular disorder of lower extremity 08/29/2015   Failure of erection 08/29/2015   Hyperlipidemia 08/29/2015   Essential hypertension 08/29/2015   Anemia due to chronic kidney disease 05/26/2015   Venous insufficiency of leg 09/04/2014   History of deep vein thrombosis (DVT) of lower extremity 08/16/2014   Prostatic intraepithelial neoplasia 11/02/2013   Elevated prostate specific antigen (PSA) 09/11/2013   Benign prostatic hyperplasia with urinary obstruction 08/13/2013   Spermatocele 08/13/2013   Avitaminosis D 01/25/2013   Type 2 diabetes mellitus with hypoglycemia without coma ([place]) 03/28/2012   Crohn disease ([place]) 08/03/2011    Orientation RESPIRATION BLADDER Height & Weight     Self, Place  O2 (6L [place])  (anuric) Weight: 60.1 kg Height:  5\' 11"  (180.3 cm)  BEHAVIORAL SYMPTOMS/MOOD NEUROLOGICAL BOWEL NUTRITION STATUS      Incontinent Diet (Renal Carb modified 1200 fluid restriction)  AMBULATORY STATUS COMMUNICATION OF NEEDS Skin   Extensive Assist (WC bound) Verbally PU Stage and Appropriate Care                       Personal Care Assistance Level of Assistance  Functional Limitations Info             SPECIAL CARE FACTORS FREQUENCY                       Contractures Contractures Info: Not present    Additional Factors Info  Code Status, Allergies (Outpatient HD) Code Status Info: Full Allergies Info: Methotrexate, Vancomycin, Cefepime, Tape           Current Medications (11/09/2021):  This is  the current hospital active medication list Current Facility-Administered Medications  Medication Dose Route Frequency Provider Last Rate Last Admin   0.9 %  sodium chloride infusion   Intravenous PRN Agbata, Tochukwu, MD 10 mL/hr at 11/06/21 2233 New Bag at 11/06/21 2233   acetaminophen (TYLENOL) tablet 1,000 mg  1,000 mg Oral Daily PRN Agbata, Tochukwu, MD   1,000 mg at 11/07/21 2034   amLODipine (NORVASC) tablet 10 mg  10 mg Oral QPM Agbata, Tochukwu, MD   10 mg at 11/08/21 1642   atorvastatin (LIPITOR) tablet 80 mg  80 mg Oral Daily Agbata, Tochukwu, MD   80 mg at 11/09/21 0801   carvedilol (COREG) tablet 25 mg  25 mg Oral BID Agbata, Tochukwu, MD   25 mg at 11/08/21 2053   cloNIDine (CATAPRES) tablet 0.1 mg  0.1 mg Oral BID Loletha Grayer, MD   0.1 mg at 11/08/21 2053   doxycycline (VIBRAMYCIN) 100 mg in sodium chloride 0.9 % 250 mL IVPB  100 mg Intravenous Q12H Agbata, Tochukwu, MD 125 mL/hr at 11/09/21 0800 100 mg at 11/09/21 0800   epoetin alfa (EPOGEN) 4000 UNIT/ML injection            epoetin alfa (EPOGEN) injection 8,000 Units  8,000 Units Intravenous Q M,W,F-HD Murlean Iba, MD   8,000 Units at 11/09/21 1210   feeding supplement (ENSURE ENLIVE / ENSURE PLUS) liquid 237 mL  237 mL Oral TID BM Agbata, Tochukwu, MD   237 mL at 11/09/21 1337   feeding supplement (ENSURE ENLIVE / ENSURE PLUS) liquid 237 mL  237 mL Oral TID BM Agbata, Tochukwu, MD   237 mL at 11/09/21 1337   gabapentin (NEURONTIN) capsule 100 mg  100 mg Oral QHS Agbata, Tochukwu, MD   100 mg at 11/08/21 2053   hydrALAZINE (APRESOLINE) tablet 100 mg  100 mg Oral Q8H Agbata, Tochukwu, MD   100 mg at 11/09/21 1337   isosorbide mononitrate (IMDUR) 24 hr tablet 30 mg  30 mg Oral Daily Agbata, Tochukwu, MD   30 mg at 11/09/21 0542   levETIRAcetam (KEPPRA) tablet 750 mg  750 mg Oral Q1200 Agbata, Tochukwu, MD   750 mg at 11/09/21 1337   lisinopril (ZESTRIL) tablet 40 mg  40 mg Oral Daily Agbata, Tochukwu, MD   40 mg at 11/08/21  3810   multivitamin (RENA-VIT) tablet 1 tablet  1 tablet Oral QHS Agbata, Tochukwu, MD   1 tablet at 11/08/21 2053   ondansetron (ZOFRAN) tablet 4 mg  4 mg Oral Q6H PRN Agbata, Tochukwu, MD       Or   ondansetron (ZOFRAN) injection 4 mg  4 mg Intravenous Q6H PRN Agbata, Tochukwu, MD       piperacillin-tazobactam (ZOSYN) IVPB 2.25 g  2.25 g Intravenous Q8H Dallie Piles, RPH 100 mL/hr at 11/09/21 1457 2.25 g at 11/09/21 1457   sevelamer carbonate (RENVELA) tablet 800 mg  800 mg Oral TID WC Agbata, Tochukwu, MD   800 mg at 11/09/21 1337  traMADol (ULTRAM) tablet 50 mg  50 mg Oral Q12H PRN Agbata, Tochukwu, MD   50 mg at 11/08/21 1954     Discharge Medications: Please see discharge summary for a list of discharge medications.  Relevant Imaging Results:  Relevant Lab Results:   Additional Information SS # [id number]  Beverly Sessions, RN

## 2021-11-09 NOTE — Progress Notes (Signed)
Inpatient Diabetes Program Recommendations  AACE/ADA: New Consensus Statement on Inpatient Glycemic Control   Target Ranges:  Prepandial:   less than 140 mg/dL      Peak postprandial:   less than 180 mg/dL (1-2 hours)      Critically ill patients:  140 - 180 mg/dL    Latest Reference Range & Units 11/09/21 04:31 11/09/21 08:18  Glucose-Capillary 70 - 99 mg/dL 208 (H) 232 (H)    Latest Reference Range & Units 11/08/21 07:54 11/08/21 11:25 11/08/21 17:16 11/08/21 20:25 11/08/21 21:39 11/08/21 23:21  Glucose-Capillary 70 - 99 mg/dL 153 (H) 151 (H) 227 (H) 176 (H) 179 (H) 205 (H)   Review of Glycemic Control  Diabetes history: DM2 Outpatient Diabetes medications: Semglee 5 units daily, Novolog 2-12 units TID with meals Current orders for Inpatient glycemic control: CBGs  Inpatient Diabetes Program Recommendations:    Insulin: Please consider ordering Novolog 0-6 units AC&HS.  NOTE: Per triage note on 11/06/21 "Arrives from [facility].  Per EMS report, CBG this morning was 39.  Patient was found unresponsive this morning.  Staff worked for an hour to give him oral glucose.  On EMS arrival CBG:  115." Glucose today 208-232 mg/dl.   Thanks, Barnie Alderman, RN, MSN, CDE Diabetes Coordinator Inpatient Diabetes Program [phone] (Team Pager from 8am to 5pm)

## 2021-11-09 NOTE — Progress Notes (Signed)
Pharmacy Antibiotic Note  [name] is a 61 y.o. male w/ PMH of DM, ESRD on HD, seizure d/o, BLE BKAs, HTN admitted on 11/06/2021 with hydradenitis suppurativa. CXR shows severe multilobar bilateral pneumonia.   Pharmacy has been consulted for Zosyn dosing.  Plan: start Zosyn 2.25 grams IV q8h   Height: 5\' 11"  (180.3 cm) Weight: 63.1 kg (139 lb 1.8 oz) IBW/kg (Calculated) : 75.3  Temp (24hrs), Avg:98.2 F (36.8 C), Min:97.5 F (36.4 C), Max:99.1 F (37.3 C)  Recent Labs  Lab 11/06/21 1008 11/06/21 1221 11/07/21 0459 11/08/21 0822 11/09/21 0539  WBC 19.8*  --  12.2* 16.6* 21.5*  CREATININE 4.16*  --  2.73* 3.71* 4.77*  LATICACIDVEN  --  0.8  --   --   --     Estimated Creatinine Clearance: 14.9 mL/min (A) (by C-G formula based on SCr of 4.77 mg/dL (H)).    Allergies  Allergen Reactions   Methotrexate Other (See Comments)    Blood count drops   Vancomycin Shortness Of Breath    Eyes watering, SOB, wheezing   Cefepime Other (See Comments)    Confusion    Tape     Antimicrobials this admission: 01/20 doxycycline >>  01/23 Zosyn >>   Microbiology results: 01/20 BCx: NG x 3 days 01/21 WCx: few GPR, GPC, GNR 01/20 SARS CoV-2: negative 01/20 influenza A/B: negative 01/21 MRSA PCR: negative  Thank you for allowing pharmacy to be a part of this patients care.  Dallie Piles 11/09/2021 12:10 PM

## 2021-11-09 NOTE — TOC Initial Note (Signed)
Transition of Care [facility]) - Initial/Assessment Note    Patient Details  Name: [name] MRN: [id number] Date of Birth: 01-11-1961  [facility] of [facility]) CM/SW Contact:    Beverly Sessions, RN Phone Number: 11/09/2021, 3:40 PM  Clinical Narrative:                 Admitted for: sepsis Admitted from: [place] ZYS:[id number] Current home health/prior home health/DME:  [facility], 2L O2 at facility 6here  Confirmed with wife plan is to return at discharge Hilda Blades at [facility] confirms patient can return at discharge  Elvera Bicker dialysis liaison notified of admission.      Expected Discharge Plan: Skilled Nursing Facility Barriers to Discharge: Continued Medical Work up   Patient Goals and CMS Choice        Expected Discharge Plan and Services Expected Discharge Plan: [place]       Living arrangements for the past 2 months: [place]                                      Prior Living Arrangements/Services Living arrangements for the past 2 months: [place] Lives with:: Facility Resident Patient language and need for interpreter reviewed:: Yes Do you feel safe going back to the place where you live?: Yes      Need for Family Participation in Patient Care: Yes (Comment) Care giver support system in place?: Yes (comment) Current home services: DME Criminal Activity/Legal Involvement Pertinent to Current Situation/Hospitalization: No - Comment as needed  Activities of Daily Living Home Assistive Devices/Equipment: Environmental consultant (specify type), Wheelchair, Prosthesis ADL Screening (condition at time of admission) Patient's cognitive ability adequate to safely complete daily activities?: No Is the patient deaf or have difficulty hearing?: No Does the patient have difficulty seeing, even when wearing glasses/contacts?: No Does the patient have difficulty concentrating, remembering, or making decisions?:  Yes Patient able to express need for assistance with ADLs?: Yes Does the patient have difficulty dressing or bathing?: Yes Independently performs ADLs?: No Communication: Dependent Is this a change from baseline?: Pre-admission baseline Dressing (OT): Dependent Is this a change from baseline?: Pre-admission baseline Grooming: Dependent Is this a change from baseline?: Pre-admission baseline Feeding: Independent Bathing: Dependent Is this a change from baseline?: Pre-admission baseline Toileting: Dependent Is this a change from baseline?: Pre-admission baseline In/Out Bed: Dependent Is this a change from baseline?: Pre-admission baseline Walks in Home: Dependent Is this a change from baseline?: Pre-admission baseline Does the patient have difficulty walking or climbing stairs?: Yes Weakness of Legs: Both Weakness of Arms/Hands: Both  Permission Sought/Granted                  Emotional Assessment       Orientation: : Oriented to Self, Oriented to Place Alcohol / Substance Use: Not Applicable Psych Involvement: No (comment)  Admission diagnosis:  Sepsis ([place]) [A41.9] Sepsis without acute organ dysfunction, due to unspecified organism [facility]) [A41.9] Patient Active Problem List   Diagnosis Date Noted   Type II diabetes mellitus with renal manifestations ([place]) 07/27/2021   Anemia in ESRD (end-stage renal disease) ([place]) 07/27/2021   Hypoglycemia 07/27/2021   Hypothermia 07/27/2021   COVID-19 virus infection 07/27/2021   Aspiration pneumonia ([place]) 07/27/2021   Unstageable pressure ulcer of sacral region ([place]) 05/22/2021   Acute decompensated heart failure ([place]) 05/19/2021   Acute respiratory failure  with hypoxia ([place]) 05/18/2021   Protein-calorie malnutrition, severe 05/14/2021   Acute pulmonary edema (HCC)    Shortness of breath    Fluid overload 05/12/2021   Cellulitis and abscess of buttock    History of GI bleed 02/06/2021   Long term current use of  immunosuppressive drug 02/06/2021   Disorder of skin due to Crohn's disease ([place]) 89/16/9450   Complication of vascular access for dialysis 01/22/2021   Polyp of transverse colon    Acute metabolic encephalopathy 38/88/2800   Acute on chronic anemia 34/91/7915   Chronic systolic CHF (congestive heart failure) ([place]) 11/18/2020   Cerebrovascular disease 09/12/2020   Transient alteration of awareness 09/12/2020   Hyperkalemia 09/12/2020   Mild cognitive impairment 09/30/2019   Peripheral vascular disease ([place]) 06/28/2018   Sepsis ([place]) 01/12/2018   Pressure injury of skin 12/04/2017   S/P bilateral BKA (below knee amputation) ([place]) 10/20/2017   Atherosclerosis of native arteries of extremity with rest pain ([place]) 08/05/2017   History of CVA (cerebrovascular accident) 04/15/2017   Seizure disorder ([place]) 04/15/2017   ESRD (end stage renal disease) ([place]) 04/12/2016   Aphthae 02/20/2016   Hidradenitis suppurativa 02/20/2016   Leg pain 02/20/2016   Neuropathy 02/20/2016   Narrowing of intervertebral disc space 08/29/2015   Vascular disorder of lower extremity 08/29/2015   Failure of erection 08/29/2015   Hyperlipidemia 08/29/2015   Essential hypertension 08/29/2015   Anemia due to chronic kidney disease 05/26/2015   Venous insufficiency of leg 09/04/2014   History of deep vein thrombosis (DVT) of lower extremity 08/16/2014   Prostatic intraepithelial neoplasia 11/02/2013   Elevated prostate specific antigen (PSA) 09/11/2013   Benign prostatic hyperplasia with urinary obstruction 08/13/2013   Spermatocele 08/13/2013   Avitaminosis D 01/25/2013   Type 2 diabetes mellitus with hypoglycemia without coma ([place]) 03/28/2012   Crohn disease ([place]) 08/03/2011   PCP:  [name], MD Pharmacy:   [facility] [place], [address] [place] [address] Phone: [phone] Fax: [phone]  [place] [place] [address] Phone: [phone] Fax: [phone]     Social Determinants of Health (SDOH) Interventions    Readmission Risk Interventions Readmission Risk Prevention Plan 11/09/2021 07/28/2021 02/10/2021  Transportation Screening Complete Complete Complete  PCP or Specialist Appt within 5-7 Days - - -  Home Care Screening - - -  Medication Review (RN CM) - - -  HRI or Home Care Consult - - Complete  Palliative Care Screening - - Complete  Medication Review (RN Care Manager) Complete Complete Complete  PCP or Specialist appointment within 3-5 days of discharge - Complete -  [place] or [place] (No Data) (No Data) -  Palliative Care Screening Not Applicable Not Applicable -  Skilled Nursing Facility Complete Not Applicable -  Some recent data might be hidden

## 2021-11-09 NOTE — Progress Notes (Signed)
Hd treatment completed. Tolerated well. UF net removal 2.5L

## 2021-11-09 NOTE — Progress Notes (Signed)
[facility]  ROUNDING NOTE   Subjective:   [name] is a 61 y.o. male with a past medical history of anemia, diabetes, hypertension, stroke, bilateral BKA, chronic systolic heart failure, and end stage renal disease on dialysis. Patient presents from [facility] with reported glucose of 39. He is also found to have increased WBCs on labs.   Patient seen and evaluated during dialysis   HEMODIALYSIS FLOWSHEET:  Blood Flow Rate (mL/min): 400 mL/min Arterial Pressure (mmHg): -190 mmHg Venous Pressure (mmHg): 170 mmHg Transmembrane Pressure (mmHg): 60 mmHg Ultrafiltration Rate (mL/min): 1190 mL/min Dialysate Flow Rate (mL/min): 500 ml/min Conductivity: Machine : 14 Conductivity: Machine : 14 Dialysis Fluid Bolus: Normal Saline Bolus Amount (mL): 250 mL  No complaints at this time   Objective:  Vital signs in last 24 hours:  Temp:  [97.5 F (36.4 C)-99.1 F (37.3 C)] 98.7 F (37.1 C) (01/23 1252) Pulse Rate:  [79-92] 86 (01/23 1300) Resp:  [16-28] 21 (01/23 1300) BP: (159-183)/(77-95) 181/89 (01/23 1300) SpO2:  [86 %-98 %] 97 % (01/23 1300) Weight:  [60.1 kg-63.1 kg] 60.1 kg (01/23 1252)  Weight change:  Filed Weights   11/06/21 0958 11/09/21 0928 11/09/21 1252  Weight: 61.6 kg 63.1 kg 60.1 kg    Intake/Output: I/O last 3 completed shifts: In: 993.1 [P.O.:240; IV Piggyback:753.1] Out: -    Intake/Output this shift:  Total I/O In: -  Out: 2500 [Other:2500]  Physical Exam: General: In no acute distress  Head: Moist oral mucosal membranes  Eyes: Anicteric  Lungs:  Normal effort and symmetrical respiratory effort, lungs clear  Heart: S1S2,no rubs or gallops  Abdomen:  Soft, nontender,non distended  Extremities: No peripheral edema.Bilateral BKA  Neurologic: Awake,alert,oriented  Skin: No lesions or rashes  Access: Left AVF    Basic Metabolic Panel: Recent Labs  Lab 11/06/21 1008 11/07/21 0459 11/08/21 0822 11/09/21 0539  NA 134*  136 137 135  K 4.3 3.6 4.4 4.9  CL 94* 95* 95* 96*  CO2 29 29 29 24   GLUCOSE 132* 150* 159* 240*  BUN 43* 27* 44* 62*  CREATININE 4.16* 2.73* 3.71* 4.77*  CALCIUM 9.2 8.8* 9.1 9.1     Liver Function Tests: Recent Labs  Lab 11/06/21 1008  AST 15  ALT 9  ALKPHOS 80  BILITOT 0.2*  PROT 7.1  ALBUMIN 2.2*    No results for input(s): LIPASE, AMYLASE in the last 168 hours. No results for input(s): AMMONIA in the last 168 hours.  CBC: Recent Labs  Lab 11/06/21 1008 11/07/21 0459 11/08/21 0822 11/09/21 0539  WBC 19.8* 12.2* 16.6* 21.5*  NEUTROABS 18.4*  --   --   --   HGB 7.5* 6.9* 8.2* 8.6*  HCT 24.8* 22.6* 26.3* 27.2*  MCV 91.9 90.4 88.9 88.9  PLT 205 195 223 206     Cardiac Enzymes: No results for input(s): CKTOTAL, CKMB, CKMBINDEX, TROPONINI in the last 168 hours.  BNP: Invalid input(s): POCBNP  CBG: Recent Labs  Lab 11/08/21 2025 11/08/21 2139 11/08/21 2321 11/09/21 0431 11/09/21 0818  GLUCAP 176* 179* 205* 208* 232*     Microbiology: Results for orders placed or performed during the hospital encounter of 11/06/21  Resp Panel by RT-PCR (Flu A&B, Covid) Nasopharyngeal Swab     Status: None   Collection Time: 11/06/21 11:14 AM   Specimen: Nasopharyngeal Swab; Nasopharyngeal(NP) swabs in vial transport medium  Result Value Ref Range Status   SARS Coronavirus 2 by RT PCR NEGATIVE NEGATIVE Final  Comment: (NOTE) SARS-CoV-2 target nucleic acids are NOT DETECTED.  The SARS-CoV-2 RNA is generally detectable in upper respiratory specimens during the acute phase of infection. The lowest concentration of SARS-CoV-2 viral copies this assay can detect is 138 copies/mL. A negative result does not preclude SARS-Cov-2 infection and should not be used as the sole basis for treatment or other patient management decisions. A negative result may occur with  improper specimen collection/handling, submission of specimen other than nasopharyngeal swab, presence of  viral mutation(s) within the areas targeted by this assay, and inadequate number of viral copies(<138 copies/mL). A negative result must be combined with clinical observations, patient history, and epidemiological information. The expected result is Negative.  Fact Sheet for Patients:  EntrepreneurPulse.com.au  Fact Sheet for Healthcare Providers:  IncredibleEmployment.be  This test is no t yet approved or cleared by the [place] FDA and  has been authorized for detection and/or diagnosis of SARS-CoV-2 by FDA under an Emergency Use Authorization (EUA). This EUA will remain  in effect (meaning this test can be used) for the duration of the COVID-19 declaration under Section 564(b)(1) of the Act, 21 U.S.C.section 360bbb-3(b)(1), unless the authorization is terminated  or revoked sooner.       Influenza A by PCR NEGATIVE NEGATIVE Final   Influenza B by PCR NEGATIVE NEGATIVE Final    Comment: (NOTE) The Xpert Xpress SARS-CoV-2/FLU/RSV plus assay is intended as an aid in the diagnosis of influenza from Nasopharyngeal swab specimens and should not be used as a sole basis for treatment. Nasal washings and aspirates are unacceptable for Xpert Xpress SARS-CoV-2/FLU/RSV testing.  Fact Sheet for Patients: EntrepreneurPulse.com.au  Fact Sheet for Healthcare Providers: IncredibleEmployment.be  This test is not yet approved or cleared by the [place] FDA and has been authorized for detection and/or diagnosis of SARS-CoV-2 by FDA under an Emergency Use Authorization (EUA). This EUA will remain in effect (meaning this test can be used) for the duration of the COVID-19 declaration under Section 564(b)(1) of the Act, 21 U.S.C. section 360bbb-3(b)(1), unless the authorization is terminated or revoked.  Performed at [facility], [address] [place], [place] [address]   Culture, blood (single)      Status: None (Preliminary result)   Collection Time: 11/06/21 12:21 PM   Specimen: BLOOD  Result Value Ref Range Status   Specimen Description BLOOD BLOOD RIGHT ARM  Final   Special Requests   Final    BOTTLES DRAWN AEROBIC AND ANAEROBIC Blood Culture results may not be optimal due to an excessive volume of blood received in culture bottles   Culture   Final    NO GROWTH 3 DAYS Performed at [facility], [address], [place], [place] [address]    Report Status PENDING  Incomplete  MRSA Next Gen by PCR, Nasal     Status: None   Collection Time: 11/07/21  5:08 AM   Specimen: Nasal Mucosa; Nasal Swab  Result Value Ref Range Status   MRSA by PCR Next Gen NOT DETECTED NOT DETECTED Final    Comment: (NOTE) The GeneXpert MRSA Assay (FDA approved for NASAL specimens only), is one component of a comprehensive MRSA colonization surveillance program. It is not intended to diagnose MRSA infection nor to guide or monitor treatment for MRSA infections. Test performance is not FDA approved in patients less than 44 years old. Performed at [facility], [address], [place],  [address]   Aerobic/Anaerobic Culture w Gram Stain (surgical/deep wound)  Status: None (Preliminary result)   Collection Time: 11/07/21 11:30 AM   Specimen: Buttocks; Abscess  Result Value Ref Range Status   Specimen Description   Final    BUTTOCKS Performed at [facility], [address] [place], [place] [address]    Special Requests   Final    Normal Performed at [facility], [place], [place] [address]    Gram Stain   Final    RARE WBC PRESENT,BOTH PMN AND MONONUCLEAR FEW GRAM POSITIVE RODS FEW GRAM POSITIVE COCCI FEW GRAM NEGATIVE RODS    Culture   Final    FEW CORYNEBACTERIUM STRIATUM Standardized susceptibility testing for this organism is not available. HOLDING FOR POSSIBLE ANAEROBE Performed at [facility], [place] [address], [place], [place] [address]    Report Status PENDING  Incomplete    Coagulation Studies: Recent Labs    11/07/21 0459  LABPROT 14.8  INR 1.2     Urinalysis: No results for input(s): COLORURINE, LABSPEC, PHURINE, GLUCOSEU, HGBUR, BILIRUBINUR, KETONESUR, PROTEINUR, UROBILINOGEN, NITRITE, LEUKOCYTESUR in the last 72 hours.  Invalid input(s): APPERANCEUR    Imaging: DG Chest Port 1 View  Result Date: 11/09/2021 CLINICAL DATA:  61 year old male with history of hypoxia. EXAM: PORTABLE CHEST 1 VIEW COMPARISON:  Chest x-ray 11/06/2021. FINDINGS: Lung volumes are low. Bibasilar opacities are noted with blunting of the costophrenic sulci bilaterally. Multifocal airspace consolidation scattered throughout the lungs bilaterally. Aeration has worsened. No pneumothorax. Pulmonary vasculature is obscured. Heart size is enlarged. The patient is rotated to the left on today's exam, resulting in distortion of the mediastinal contours and reduced diagnostic sensitivity and specificity for mediastinal pathology. Atherosclerotic calcifications are noted in the thoracic aorta. Stents are noted projecting over the left axillary region and upper mediastinum. IMPRESSION: 1. Worsening severe multilobar bilateral pneumonia with small bilateral parapneumonic pleural effusions. 2. Mild cardiomegaly. 3. Aortic atherosclerosis. Electronically Signed   By: Vinnie Langton M.D.   On: 11/09/2021 08:11     Medications:    sodium chloride 10 mL/hr at 11/06/21 2233   doxycycline (VIBRAMYCIN) IV 100 mg (11/09/21 0800)   piperacillin-tazobactam (ZOSYN)  IV      amLODipine  10 mg Oral QPM   atorvastatin  80 mg Oral Daily   carvedilol  25 mg Oral BID   cloNIDine  0.1 mg Oral BID   epoetin alfa       epoetin (EPOGEN/PROCRIT) injection  8,000 Units Intravenous Q M,W,F-HD   feeding supplement  237 mL Oral TID BM   feeding supplement  237 mL Oral TID BM   gabapentin  100 mg Oral QHS   hydrALAZINE  100 mg Oral Q8H    isosorbide mononitrate  30 mg Oral Daily   levETIRAcetam  750 mg Oral Q1200   lisinopril  40 mg Oral Daily   multivitamin  1 tablet Oral QHS   sevelamer carbonate  800 mg Oral TID WC     Assessment/ Plan:  [name] is a 61 y.o.  male  with a past medical history of anemia, diabetes, hypertension, stroke, bilateral BKA, chronic systolic heart failure, and end stage renal disease on dialysis. Patient presents from [facility] with reported glucose of 39. He is also found to have increased WBCs on labs.  [facility] [address] MWF L AVF /61 kg  #End-stage renal disease on hemodialysis.  Will maintain outpatient schedule during this admission.  Currently receiving dialysis treatment, UF goal 2.5  L as tolerated.  Next treatment scheduled for Wednesday.   #Diabetes mellitus type II with chronic kidney disease insulin dependent. Home regimen includes Lantus, NovoLog, and [name].  Lab Results  Component Value Date   HGBA1C 6.5 (H) 07/29/2021    Glucose currently elevated.  Primary team to manage SSI   #Anemia of chronic kidney disease  Lab Results  Component Value Date   HGB 8.6 (L) 11/09/2021   Patient received blood transfusion this admission. Hemoglobin remains below desired target.  We will continue EPO 8000 units IV with dialysis treatments.   #Secondary Hyperparathyroidism  Lab Results  Component Value Date   PTH 283 (H) 01/08/2019   CALCIUM 9.1 11/09/2021   CAION 1.10 (L) 09/12/2020   PHOS 6.9 (H) 05/19/2021  Calcium remains at target.  Phosphorus elevated.   Continue Sevelamer 800 mg TID  #Hidradenitis of the buttocks. Chronic seropurulent drainage Currently being treated with broad-spectrum antibiotics.   LOS: 3   1/23/20232:51 PM

## 2021-11-10 LAB — CBC
HCT: 23.3 % — ABNORMAL LOW (ref 39.0–52.0)
Hemoglobin: 7.3 g/dL — ABNORMAL LOW (ref 13.0–17.0)
MCH: 27.7 pg (ref 26.0–34.0)
MCHC: 31.3 g/dL (ref 30.0–36.0)
MCV: 88.3 fL (ref 80.0–100.0)
Platelets: 157 10*3/uL (ref 150–400)
RBC: 2.64 MIL/uL — ABNORMAL LOW (ref 4.22–5.81)
RDW: 17.2 % — ABNORMAL HIGH (ref 11.5–15.5)
WBC: 11.7 10*3/uL — ABNORMAL HIGH (ref 4.0–10.5)
nRBC: 0 % (ref 0.0–0.2)

## 2021-11-10 LAB — GLUCOSE, CAPILLARY
Glucose-Capillary: 140 mg/dL — ABNORMAL HIGH (ref 70–99)
Glucose-Capillary: 177 mg/dL — ABNORMAL HIGH (ref 70–99)
Glucose-Capillary: 146 mg/dL — ABNORMAL HIGH (ref 70–99)

## 2021-11-10 LAB — PREPARE RBC (CROSSMATCH)

## 2021-11-10 MED ORDER — FUROSEMIDE 10 MG/ML IJ SOLN
60.0000 mg | Freq: Once | INTRAMUSCULAR | Status: AC
  Administered 2021-11-10: 11:00:00 60 mg via INTRAVENOUS
  Filled 2021-11-10: qty 8

## 2021-11-10 MED ORDER — SODIUM CHLORIDE 0.9% IV SOLUTION: Freq: Once | INTRAVENOUS | Status: AC

## 2021-11-10 MED ORDER — ACETAMINOPHEN 325 MG PO TABS
650.0000 mg | ORAL_TABLET | Freq: Once | ORAL | Status: AC
  Administered 2021-11-10: 11:00:00 650 mg via ORAL
  Filled 2021-11-10: qty 2

## 2021-11-10 NOTE — Progress Notes (Signed)
[facility]  ROUNDING NOTE   Subjective:   [name] is a 61 y.o. male with a past medical history of anemia, diabetes, hypertension, stroke, bilateral BKA, chronic systolic heart failure, and end stage renal disease on dialysis. Patient presents from [facility] with reported glucose of 39. He is also found to have increased WBCs on labs.   Patient seen lying in bed, resting quietly Denies shortness of breath, remains on 4 L nasal cannula Denies nausea and vomiting Dialysis received yesterday, tolerated well   Objective:  Vital signs in last 24 hours:  Temp:  [98.1 F (36.7 C)-98.8 F (37.1 C)] 98.1 F (36.7 C) (01/24 1255) Pulse Rate:  [67-90] 67 (01/24 1255) Resp:  [16-20] 18 (01/24 1255) BP: (134-171)/(58-81) 138/77 (01/24 1255) SpO2:  [91 %-100 %] 100 % (01/24 1255)  Weight change:  Filed Weights   11/06/21 0958 11/09/21 0928 11/09/21 1252  Weight: 61.6 kg 63.1 kg 60.1 kg    Intake/Output: I/O last 3 completed shifts: In: 742.8 [P.O.:240; IV Piggyback:502.8] Out: 2500 [Other:2500]   Intake/Output this shift:  Total I/O In: 130 [P.O.:120; I.V.:10] Out: -   Physical Exam: General: In no acute distress  Head: Moist oral mucosal membranes  Eyes: Anicteric  Lungs:  Normal effort and symmetrical respiratory effort, lungs clear  Heart: S1S2,no rubs or gallops  Abdomen:  Soft, nontender,non distended  Extremities: No peripheral edema.Bilateral BKA  Neurologic: Awake,alert,oriented  Skin: No lesions or rashes  Access: Left AVF    Basic Metabolic Panel: Recent Labs  Lab 11/06/21 1008 11/07/21 0459 11/08/21 0822 11/09/21 0539  NA 134* 136 137 135  K 4.3 3.6 4.4 4.9  CL 94* 95* 95* 96*  CO2 29 29 29 24   GLUCOSE 132* 150* 159* 240*  BUN 43* 27* 44* 62*  CREATININE 4.16* 2.73* 3.71* 4.77*  CALCIUM 9.2 8.8* 9.1 9.1     Liver Function Tests: Recent Labs  Lab 11/06/21 1008  AST 15  ALT 9  ALKPHOS 80  BILITOT 0.2*  PROT 7.1   ALBUMIN 2.2*    No results for input(s): LIPASE, AMYLASE in the last 168 hours. No results for input(s): AMMONIA in the last 168 hours.  CBC: Recent Labs  Lab 11/06/21 1008 11/07/21 0459 11/08/21 0822 11/09/21 0539 11/10/21 0431  WBC 19.8* 12.2* 16.6* 21.5* 11.7*  NEUTROABS 18.4*  --   --   --   --   HGB 7.5* 6.9* 8.2* 8.6* 7.3*  HCT 24.8* 22.6* 26.3* 27.2* 23.3*  MCV 91.9 90.4 88.9 88.9 88.3  PLT 205 195 223 206 157     Cardiac Enzymes: No results for input(s): CKTOTAL, CKMB, CKMBINDEX, TROPONINI in the last 168 hours.  BNP: Invalid input(s): POCBNP  CBG: Recent Labs  Lab 11/09/21 0818 11/09/21 1653 11/09/21 2015 11/09/21 2339 11/10/21 0402  GLUCAP 232* 94 153* 161* 140*     Microbiology: Results for orders placed or performed during the hospital encounter of 11/06/21  Resp Panel by RT-PCR (Flu A&B, Covid) Nasopharyngeal Swab     Status: None   Collection Time: 11/06/21 11:14 AM   Specimen: Nasopharyngeal Swab; Nasopharyngeal(NP) swabs in vial transport medium  Result Value Ref Range Status   SARS Coronavirus 2 by RT PCR NEGATIVE NEGATIVE Final    Comment: (NOTE) SARS-CoV-2 target nucleic acids are NOT DETECTED.  The SARS-CoV-2 RNA is generally detectable in upper respiratory specimens during the acute phase of infection. The lowest concentration of SARS-CoV-2 viral copies this assay can detect  is 138 copies/mL. A negative result does not preclude SARS-Cov-2 infection and should not be used as the sole basis for treatment or other patient management decisions. A negative result may occur with  improper specimen collection/handling, submission of specimen other than nasopharyngeal swab, presence of viral mutation(s) within the areas targeted by this assay, and inadequate number of viral copies(<138 copies/mL). A negative result must be combined with clinical observations, patient history, and epidemiological information. The expected result is  Negative.  Fact Sheet for Patients:  EntrepreneurPulse.com.au  Fact Sheet for Healthcare Providers:  IncredibleEmployment.be  This test is no t yet approved or cleared by the [place] FDA and  has been authorized for detection and/or diagnosis of SARS-CoV-2 by FDA under an Emergency Use Authorization (EUA). This EUA will remain  in effect (meaning this test can be used) for the duration of the COVID-19 declaration under Section 564(b)(1) of the Act, 21 U.S.C.section 360bbb-3(b)(1), unless the authorization is terminated  or revoked sooner.       Influenza A by PCR NEGATIVE NEGATIVE Final   Influenza B by PCR NEGATIVE NEGATIVE Final    Comment: (NOTE) The Xpert Xpress SARS-CoV-2/FLU/RSV plus assay is intended as an aid in the diagnosis of influenza from Nasopharyngeal swab specimens and should not be used as a sole basis for treatment. Nasal washings and aspirates are unacceptable for Xpert Xpress SARS-CoV-2/FLU/RSV testing.  Fact Sheet for Patients: EntrepreneurPulse.com.au  Fact Sheet for Healthcare Providers: IncredibleEmployment.be  This test is not yet approved or cleared by the [place] FDA and has been authorized for detection and/or diagnosis of SARS-CoV-2 by FDA under an Emergency Use Authorization (EUA). This EUA will remain in effect (meaning this test can be used) for the duration of the COVID-19 declaration under Section 564(b)(1) of the Act, 21 U.S.C. section 360bbb-3(b)(1), unless the authorization is terminated or revoked.  Performed at [facility], [address] [place], [place] [address]   Culture, blood (single)     Status: None (Preliminary result)   Collection Time: 11/06/21 12:21 PM   Specimen: BLOOD  Result Value Ref Range Status   Specimen Description BLOOD BLOOD RIGHT ARM  Final   Special Requests   Final    BOTTLES DRAWN AEROBIC AND ANAEROBIC Blood  Culture results may not be optimal due to an excessive volume of blood received in culture bottles   Culture   Final    NO GROWTH 4 DAYS Performed at [facility], [address] [place], [place] [address]    Report Status PENDING  Incomplete  MRSA Next Gen by PCR, Nasal     Status: None   Collection Time: 11/07/21  5:08 AM   Specimen: Nasal Mucosa; Nasal Swab  Result Value Ref Range Status   MRSA by PCR Next Gen NOT DETECTED NOT DETECTED Final    Comment: (NOTE) The GeneXpert MRSA Assay (FDA approved for NASAL specimens only), is one component of a comprehensive MRSA colonization surveillance program. It is not intended to diagnose MRSA infection nor to guide or monitor treatment for MRSA infections. Test performance is not FDA approved in patients less than 92 years old. Performed at [facility], [address] [place], [place] [address]   Aerobic/Anaerobic Culture w Gram Stain (surgical/deep wound)     Status: None (Preliminary result)   Collection Time: 11/07/21 11:30 AM   Specimen: Buttocks; Abscess  Result Value Ref Range Status   Specimen Description   Final    BUTTOCKS Performed at  [facility], [address], [place], [place] [address]    Special Requests   Final    Normal Performed at [facility], [place], [place] [address]    Gram Stain   Final    RARE WBC PRESENT,BOTH PMN AND MONONUCLEAR FEW GRAM POSITIVE RODS FEW GRAM POSITIVE COCCI FEW GRAM NEGATIVE RODS    Culture   Final    FEW CORYNEBACTERIUM STRIATUM Standardized susceptibility testing for this organism is not available. HOLDING FOR POSSIBLE ANAEROBE Performed at [facility], [place] [address], [place], [place] [address]    Report Status PENDING  Incomplete    Coagulation Studies: No results for input(s): LABPROT, INR in the last 72 hours.   Urinalysis: No results for input(s): COLORURINE, LABSPEC, PHURINE, GLUCOSEU, HGBUR,  BILIRUBINUR, KETONESUR, PROTEINUR, UROBILINOGEN, NITRITE, LEUKOCYTESUR in the last 72 hours.  Invalid input(s): APPERANCEUR    Imaging: DG Chest Port 1 View  Result Date: 11/09/2021 CLINICAL DATA:  61 year old male with history of hypoxia. EXAM: PORTABLE CHEST 1 VIEW COMPARISON:  Chest x-ray 11/06/2021. FINDINGS: Lung volumes are low. Bibasilar opacities are noted with blunting of the costophrenic sulci bilaterally. Multifocal airspace consolidation scattered throughout the lungs bilaterally. Aeration has worsened. No pneumothorax. Pulmonary vasculature is obscured. Heart size is enlarged. The patient is rotated to the left on today's exam, resulting in distortion of the mediastinal contours and reduced diagnostic sensitivity and specificity for mediastinal pathology. Atherosclerotic calcifications are noted in the thoracic aorta. Stents are noted projecting over the left axillary region and upper mediastinum. IMPRESSION: 1. Worsening severe multilobar bilateral pneumonia with small bilateral parapneumonic pleural effusions. 2. Mild cardiomegaly. 3. Aortic atherosclerosis. Electronically Signed   By: Vinnie Langton M.D.   On: 11/09/2021 08:11     Medications:    sodium chloride 10 mL/hr at 11/06/21 2233    sodium chloride   Intravenous Once   amLODipine  10 mg Oral QPM   atorvastatin  80 mg Oral Daily   carvedilol  25 mg Oral BID   cloNIDine  0.1 mg Oral BID   epoetin (EPOGEN/PROCRIT) injection  8,000 Units Intravenous Q M,W,F-HD   feeding supplement  237 mL Oral TID BM   feeding supplement  237 mL Oral TID BM   gabapentin  100 mg Oral QHS   hydrALAZINE  100 mg Oral Q8H   insulin aspart  2 Units Subcutaneous TID WC   isosorbide mononitrate  30 mg Oral Daily   levETIRAcetam  750 mg Oral Q1200   levofloxacin  500 mg Oral Q48H   lisinopril  40 mg Oral Daily   multivitamin  1 tablet Oral QHS   sevelamer carbonate  800 mg Oral TID WC     Assessment/ Plan:  Mr. Karla Vines Bloom is a 61  y.o.  male  with a past medical history of anemia, diabetes, hypertension, stroke, bilateral BKA, chronic systolic heart failure, and end stage renal disease on dialysis. Patient presents from [facility] with reported glucose of 39. He is also found to have increased WBCs on labs.  [facility] [address] MWF L AVF /61 kg  #End-stage renal disease on hemodialysis.  Will maintain outpatient schedule during this admission.  Received dialysis yesterday with UF goal 2.5 L achieved.  Next dialysis treatment scheduled for Wednesday.   #Diabetes mellitus type II with chronic kidney disease insulin dependent. Home regimen includes Lantus, NovoLog, and [name].  Lab Results  Component Value Date   HGBA1C  6.5 (H) 07/29/2021    Primary team to manage SSI   #Anemia of chronic kidney disease  Lab Results  Component Value Date   HGB 7.3 (L) 11/10/2021   Patient received blood transfusion this admission. Hemoglobin below desired target.  We will continue EPO 8000 units IV with dialysis.   #Secondary Hyperparathyroidism  Lab Results  Component Value Date   PTH 283 (H) 01/08/2019   CALCIUM 9.1 11/09/2021   CAION 1.10 (L) 09/12/2020   PHOS 6.9 (H) 05/19/2021  Calcium remains at target.  Phosphorus elevated.   Continue Sevelamer 800 mg with meals  #Hidradenitis of the buttocks. Chronic seropurulent drainage Currently being treated with broad-spectrum antibiotics.   LOS: [place] 1/24/20232:04 PM

## 2021-11-10 NOTE — Telephone Encounter (Signed)
The form that his insurance company sent requires that we complete a Survey called the Functional Assessment Staging. In order to do that we need the following questions answered.   Does he have forget location of objects or have work difficulties?   Does he have Difficulty traveling to new locations?    Does he have decreased organizational capacity?   Does he have decreased ability to perform complex tasks, e.g., planning dinner for guests, handling personal  finances (such as forgetting to pay bills), difficulty marketing, etc.?   Does he require assistance in choosing proper clothing to wear for the day, e.g.,  patient may wear the same clothing repeatedly or clothes for the wrong season? (occasionally) or (more frequently over the past weeks)   Does he Improperly put on clothes without assistance or prompting (e.g., may put street clothes on  over night clothes? (occasionally) or (more frequently over the past weeks)   Does he put shoes on wrong feet or have difficulty buttoning clothing? (occasionally) or (more frequently over the past weeks)   Is he Unable to bathe properly (e.g., difficulty adjusting bathwater temp.)? (occasionally) or (more frequently over the past weeks)    Does he have  Inability to handle mechanics of toileting (e.g., forgets to flush the toilet, does not wipe properly  or properly dispose of toilet tissue)? (occasionally) or (more frequently over the past weeks)   Does he have . Urinary incontinence? (occasionally) or (more frequently over the past weeks)   Does he have  Fecal incontinence? (occasionally) or (more frequently over the past weeks)    Is Ability to speak limited to approximately a half-dozen intelligible different  words or fewer in the course of an average day or in the course of an  intensive interview?   Can he ambulate without personal assistance?   Can he sit up without assistance?   Can he smile?   Can he  hold his head up independently?

## 2021-11-10 NOTE — Plan of Care (Signed)

## 2021-11-10 NOTE — Care Management Important Message (Signed)
Important Message  Patient Details  Name: [name] MRN: [id number] Date of Birth: 1961-07-02   Medicare Important Message Given:  Yes     [name] 11/10/2021, 10:17 AM

## 2021-11-10 NOTE — Progress Notes (Signed)
Patient ID: [name], male   DOB: [birth year], 61 y.o.   MRN: [id number] [facility] PROGRESS NOTE  [name] PJA:[id number] DOB: 11-21-1960 DOA: 11/06/2021 PCP: [name], MD  HPI/Subjective: Patient feels okay.  Not having much cough.  Patient's wife concerned that he is not back to his baseline mental status.  Mental status worsened 2 nights ago and was diagnosed with pneumonia yesterday.  Patient required oxygen  but able to come off oxygen today.  Objective: Vitals:   11/10/21 1255 11/10/21 1536  BP: 138/77 (!) 150/69  Pulse: 67 60  Resp: 18 17  Temp: 98.1 F (36.7 C) 98.2 F (36.8 C)  SpO2: 100% 100%    Intake/Output Summary (Last 24 hours) at 11/10/2021 1719 Last data filed at 11/10/2021 1536 Gross per 24 hour  Intake 736.67 ml  Output --  Net 736.67 ml   Filed Weights   11/06/21 0958 11/09/21 0928 11/09/21 1252  Weight: 61.6 kg 63.1 kg 60.1 kg    ROS: Review of Systems  Respiratory:  Negative for cough and shortness of breath.   Cardiovascular:  Negative for chest pain.  Gastrointestinal:  Negative for abdominal pain, nausea and vomiting.  Exam: Physical Exam HENT:     Head: Normocephalic.     Mouth/Throat:     Pharynx: No oropharyngeal exudate.  Eyes:     General: Lids are normal.     Conjunctiva/sclera: Conjunctivae normal.  Cardiovascular:     Rate and Rhythm: Normal rate and regular rhythm.     Heart sounds: Normal heart sounds, S1 normal and S2 normal.  Pulmonary:     Breath sounds: Examination of the right-lower field reveals decreased breath sounds. Examination of the left-lower field reveals decreased breath sounds. Decreased breath sounds present. No wheezing, rhonchi or rales.  Abdominal:     Palpations: Abdomen is soft.     Tenderness: There is no abdominal tenderness.  Musculoskeletal:     Right upper leg: No swelling.     Left upper leg: No swelling.  Skin:    General: Skin is warm.     Comments: Left buttock has a  draining area  Neurological:     Mental Status: He is alert.     Comments: Answer some simple yes/no questions appropriately for me.      Scheduled Meds:  amLODipine  10 mg Oral QPM   atorvastatin  80 mg Oral Daily   carvedilol  25 mg Oral BID   cloNIDine  0.1 mg Oral BID   epoetin (EPOGEN/PROCRIT) injection  8,000 Units Intravenous Q M,W,F-HD   feeding supplement  237 mL Oral TID BM   gabapentin  100 mg Oral QHS   hydrALAZINE  100 mg Oral Q8H   insulin aspart  2 Units Subcutaneous TID WC   isosorbide mononitrate  30 mg Oral Daily   levETIRAcetam  750 mg Oral Q1200   levofloxacin  500 mg Oral Q48H   lisinopril  40 mg Oral Daily   multivitamin  1 tablet Oral QHS   sevelamer carbonate  800 mg Oral TID WC   Continuous Infusions:  sodium chloride 10 mL/hr at 11/06/21 2233   Brief history 61 year old man with past medical history of hydradenitis suppurativa, hypertension, intracranial hemorrhage, stroke, type 2 diabetes mellitus, cardiomyopathy, anemia and peripheral vascular disease status post bilateral BKA.[place]  He presented with hypoglycemia and found to have clinical sepsis with draining hydradenitis suppurativa.  The patient was started on antibiotics.  The patient had  some worsening mental status and was diagnosed with pneumonia and started on Levaquin yesterday.  Assessment/Plan:  Acute metabolic encephalopathy.  Repeat chest x-ray showing worsening pneumonia.  Patient's wife thinks his mental status is not back to baseline yet.  Patient started on Levaquin yesterday after dialysis.  White blood cell count has come down from 21.5 to 11.7. Acute hypoxic respiratory failure.  Patient had a pulse ox of 86% on room air and required oxygen.  As of this afternoon able to come off the oxygen. Clinical sepsis, present on admission with leukocytosis hypothermia and infected hidradenitis suppurativa.  Patient on [name] as outpatient.  Can go back on that weekly dosing.  Seen by general  surgery and they signed off.  Patient initially on doxycycline but patient's wife said these will never heal and did not think antibiotics were necessary.  Doxycycline discontinued yesterday.  Wound culture growing few gram-positive rods, few gram-positive cocci, few gram-negative rods, few corynebacterium stratum. Acute on chronic anemia.  Today's hemoglobin 7.3, I will give another unit of packed red blood cells today.  Received a unit of packed red blood cells on the second day of hospitalization for hemoglobin of 6.9.  Epogen with dialysis. End-stage renal disease on dialysis Monday Wednesday Friday.  Will have dialysis tomorrow Hyperlipidemia unspecified on atorvastatin Seizure disorder on Keppra Type 2 diabetes mellitus with diabetic neuropathy on gabapentin.  Initially the patient did have hypoglycemia upon coming into the hospital.  I am giving 2 units insulin with meals.  Watch sugars closely with being on Levaquin. Numerous pressure ulcers, present on admission.  See full description below Peripheral vascular disease.  Patient has bilateral lower extremity BKA's  Pressure Injury 05/20/21 Sacrum Medial Stage 2 -  Partial thickness loss of dermis presenting as a shallow open injury with a red, pink wound bed without slough. pink/red sloughing, superficial sin loss (Active)  05/20/21 0800  Location: Sacrum  Location Orientation: Medial  Staging: Stage 2 -  Partial thickness loss of dermis presenting as a shallow open injury with a red, pink wound bed without slough.  Wound Description (Comments): pink/red sloughing, superficial sin loss  Present on Admission: Yes     Pressure Injury 07/27/21 Sacrum Stage 2 -  Partial thickness loss of dermis presenting as a shallow open injury with a red, pink wound bed without slough. 3x3cm stage 2 sacral decub present on admission (Active)  07/27/21 1330  Location: Sacrum  Location Orientation:   Staging: Stage 2 -  Partial thickness loss of dermis  presenting as a shallow open injury with a red, pink wound bed without slough.  Wound Description (Comments): 3x3cm stage 2 sacral decub present on admission  Present on Admission: Yes     Pressure Injury Thigh Distal;Posterior;Right Stage 1 -  Intact skin with non-blanchable redness of a localized area usually over a bony prominence. (Active)     Location: Thigh  Location Orientation: Distal;Posterior;Right  Staging: Stage 1 -  Intact skin with non-blanchable redness of a localized area usually over a bony prominence.  Wound Description (Comments):   Present on Admission: Yes       Code Status:     Code Status Orders  (From admission, onward)           Start     Ordered   11/06/21 2012  Full code  Continuous        11/06/21 2011           Code Status History  Date Active Date Inactive Code Status Order ID Comments User Context   07/27/2021 1321 08/02/2021 2249 Full Code [id number]  Ivor Costa, MD ED   06/09/2021 1039 06/13/2021 2348 Full Code [id number]  Collier Bullock, MD ED   05/18/2021 0539 05/26/2021 2146 Full Code [id number]  Rise Patience, MD ED   05/12/2021 2306 05/15/2021 2240 Full Code [id number]  Clance Boll, MD ED   02/06/2021 2254 02/10/2021 2225 Full Code [id number]  Athena Masse, MD ED   11/18/2020 1508 11/25/2020 1534 Full Code [id number]  Ivor Costa, MD ED   09/12/2020 1917 09/14/2020 2156 Full Code [id number]  Para Skeans, MD ED   01/05/2019 2016 01/08/2019 1759 Full Code [id number]  Fritzi Mandes, MD Inpatient   12/04/2017 0511 12/12/2017 2355 Full Code [id number]  Salary, Avel Peace, MD Inpatient   08/22/2017 1244 08/22/2017 1800 Full Code [id number]  Algernon Huxley, MD Inpatient   04/21/2017 1819 04/25/2017 1915 Full Code [id number]  Dustin Flock, MD Inpatient   11/06/2015 2035 11/07/2015 1818 Full Code [id number]  Aldean Jewett, MD ED      Family Communication: Updated patient's wife on the phone Disposition Plan: Status is: Inpatient  Evaluate on a  daily basis on disposition back to facility.  Watching another day here in the hospital with altered mental status not back to baseline  Antibiotics: [place]  [facility]

## 2021-11-11 ENCOUNTER — Telehealth: Payer: Self-pay

## 2021-11-11 LAB — GLUCOSE, CAPILLARY
Glucose-Capillary: 120 mg/dL — ABNORMAL HIGH (ref 70–99)
Glucose-Capillary: 189 mg/dL — ABNORMAL HIGH (ref 70–99)

## 2021-11-11 LAB — BASIC METABOLIC PANEL
Anion gap: 10 (ref 5–15)
BUN: 48 mg/dL — ABNORMAL HIGH (ref 6–20)
CO2: 28 mmol/L (ref 22–32)
Calcium: 8.9 mg/dL (ref 8.9–10.3)
Chloride: 99 mmol/L (ref 98–111)
Creatinine, Ser: 4.23 mg/dL — ABNORMAL HIGH (ref 0.61–1.24)
GFR, Estimated: 15 mL/min — ABNORMAL LOW (ref >60)
Glucose, Bld: 108 mg/dL — ABNORMAL HIGH (ref 70–99)
Potassium: 3.5 mmol/L (ref 3.5–5.1)
Sodium: 137 mmol/L (ref 135–145)

## 2021-11-11 LAB — BPAM RBC
Blood Product Expiration Date: 202302132359
Blood Product Expiration Date: 202302132359
ISSUE DATE / TIME: 202301211439
ISSUE DATE / TIME: 202301241233
Unit Type and Rh: 7300
Unit Type and Rh: 7300

## 2021-11-11 LAB — RESP PANEL BY RT-PCR (FLU A&B, COVID) ARPGX2
Influenza A by PCR: NEGATIVE
Influenza B by PCR: NEGATIVE
SARS Coronavirus 2 by RT PCR: NEGATIVE

## 2021-11-11 LAB — TYPE AND SCREEN
ABO/RH(D): B POS
Antibody Screen: NEGATIVE
Unit division: 0
Unit division: 0

## 2021-11-11 LAB — CBC
HCT: 25.6 % — ABNORMAL LOW (ref 39.0–52.0)
Hemoglobin: 8.2 g/dL — ABNORMAL LOW (ref 13.0–17.0)
MCH: 28.2 pg (ref 26.0–34.0)
MCHC: 32 g/dL (ref 30.0–36.0)
MCV: 88 fL (ref 80.0–100.0)
Platelets: 137 10*3/uL — ABNORMAL LOW (ref 150–400)
RBC: 2.91 MIL/uL — ABNORMAL LOW (ref 4.22–5.81)
RDW: 17.2 % — ABNORMAL HIGH (ref 11.5–15.5)
WBC: 12.2 10*3/uL — ABNORMAL HIGH (ref 4.0–10.5)
nRBC: 0 % (ref 0.0–0.2)

## 2021-11-11 LAB — AEROBIC/ANAEROBIC CULTURE W GRAM STAIN (SURGICAL/DEEP WOUND): Special Requests: NORMAL

## 2021-11-11 NOTE — TOC Progression Note (Signed)
Transition of Care [facility]) - Progression Note    Patient Details  Name: [name] MRN: [id number] Date of Birth: 04/09/1961  Transition of Care [facility]) CM/SW Contact  Beverly Sessions, RN Phone Number: 11/11/2021, 1:55 PM  Clinical Narrative:    Per MD anticipated DC tomorrow. Hilda Blades at [facility] notified Requested MD to order covid test    Expected Discharge Plan: [place] Barriers to Discharge: Continued Medical Work up  Expected Discharge Plan and Services Expected Discharge Plan: [place]       Living arrangements for the past 2 months: [place]                                       Social Determinants of Health (SDOH) Interventions    Readmission Risk Interventions Readmission Risk Prevention Plan 11/09/2021 07/28/2021 02/10/2021  Transportation Screening Complete Complete Complete  PCP or Specialist Appt within 5-7 Days - - -  Home Care Screening - - -  Medication Review (RN CM) - - -  HRI or Home Care Consult - - Complete  Palliative Care Screening - - Complete  Medication Review ([place]) Complete Complete Complete  PCP or Specialist appointment within 3-5 days of discharge - Complete -  [place] or [place] (No Data) (No Data) -  Palliative Care Screening Not Applicable Not Applicable -  [place] Complete Not Applicable -  Some recent data might be hidden

## 2021-11-11 NOTE — Telephone Encounter (Signed)
Phone call returned to [place]. See separate phone message.

## 2021-11-11 NOTE — Progress Notes (Signed)
FAST staging done by interview with patient and caregiver on 11/11/2021   Functional Assessment Staging of Alzheimers Disease. (FAST)  2. Complains of forgetting location of objects. Subjective word finding difficulties. - TRUE  3. Decreased job function evident to co-workers;difficulty in traveling to new locations. Decreased organizational capacity  - TRUE  4. Decreased ability to perform complex tasks (e.g., planning dinner for guests), -TRUE  5. Require assistance in choosing proper clothing to wear for the day - TRUE- DAILY  6a .Does he Improperly put on clothes without assistance or prompting - TRUE - DAILY 6b. Unable to bathe properly - TRUE DAILY 6c. Inability to handle mechanics of toileting - 6d.   Urinary incontinence - TRUE - DAILY 6e.   Fecal incontinence - TRUE -DAILY  7a. Is Ability to speak limited to approximately a half-dozen intelligible different worse - TRUE- DAILY 7b. Speech ability limited to the use of a single intelligible word in an average day - TRUE DAILY 7c. Ambulatory ability lost (cannot walk without personal assistance). - TRUE 7d. Ability to sit up without assistance lost - TRUE 7e. Loss of the ability to smile. - TRUE

## 2021-11-11 NOTE — Telephone Encounter (Signed)
I called and spoke with patients wife [name] and completed assessment questions. Please review responses below.   Functional Assessment Staging   Does he have forget location of objects or have work difficulties? YES     Does he have Difficulty traveling to new locations?  YES     Does he have decreased organizational capacity? YES     Does he have decreased ability to perform complex tasks, e.g., planning dinner for guests, handling personal  finances (such as forgetting to pay bills), difficulty marketing, etc.?  YES   Does he require assistance in choosing proper clothing to wear for the day, e.g.,  patient may wear the same clothing repeatedly or clothes for the wrong season? (occasionally) or (more frequently over the past weeks)  YES. DAILY. more frequently over the past weeks   Does he Improperly put on clothes without assistance or prompting (e.g., may put street clothes on  over night clothes? (occasionally) or (more frequently over the past weeks)  YES. DAILY. more frequently over the past weeks   Does he put shoes on wrong feet or have difficulty buttoning clothing? (occasionally) or (more frequently over the past weeks)  YES. DAILY. more frequently over the past weeks   Is he Unable to bathe properly (e.g., difficulty adjusting bathwater temp.)? (occasionally) or (more frequently over the past weeks)  YES. DAILY. more frequently over the past weeks     Does he have  Inability to handle mechanics of toileting (e.g., forgets to flush the toilet, does not wipe properly  or properly dispose of toilet tissue)? (occasionally) or (more frequently over the past weeks)  YES. DAILY. more frequently over the past weeks   Does he have . Urinary incontinence? (occasionally) or (more frequently over the past weeks)  YES. DAILY. more frequently over the past weeks. Wears a depend adult diaper.   Does he have  Fecal incontinence? (occasionally) or (more frequently over the  past weeks)  YES. DAILY. more frequently over the past weeks. Wears a depend adult diaper.     Is Ability to speak limited to approximately a half-dozen intelligible different  words or fewer in the course of an average day or in the course of an  intensive interview?  Yes. fewer in the course of an average day or in the course of an intensive interview.   Can he ambulate without personal assistance? NO     Can he sit up without assistance? NO     Can he smile? NO. Has not shown change in facial expression recently.     Can he hold his head up independently? NO. He keeps his head facing a downward position.

## 2021-11-11 NOTE — Telephone Encounter (Signed)
Copied from [place] [id number]. Topic: General - Other >> Nov 11, 2021  9:30 AM Tessa Lerner A wrote: Reason for CRM: The patient's wife has called requesting to speak with a member of staff when possible  The patient's wife has called for an update on previously submitted paperwork related to [employer]  Please contact further when possible

## 2021-11-11 NOTE — Progress Notes (Signed)
Patient A&Ox4 during this shift. Requesting O2 even though sats were above 92%. Put on 1L for comfort. Removed around 6am and sats 93%RA.

## 2021-11-11 NOTE — Progress Notes (Signed)
Patient has refused all finger sticks on my shift (7a-7p).

## 2021-11-11 NOTE — Progress Notes (Signed)
HD completed, tolerated well, UF net removed  1574ml

## 2021-11-11 NOTE — Progress Notes (Signed)
PROGRESS NOTE    [name]  SKS:[id number] DOB: 04-18-1961 DOA: 11/06/2021 PCP: [name], MD    Assessment & Plan:   Principal Problem:   Sepsis [facility]) Active Problems:   Hyperlipidemia   Essential hypertension   Hidradenitis suppurativa   Type 2 diabetes mellitus with hypoglycemia without coma ([place])   ESRD (end stage renal disease) ([place])   S/P bilateral BKA (below knee amputation) (HCC)   Chronic systolic CHF (congestive heart failure) ([place])   Type 2 diabetes mellitus with diabetic neuropathy, without long-term current use of insulin (HCC)   Anemia in ESRD (end-stage renal disease) ([place])   Multifocal pneumonia  Acute metabolic encephalopathy:  possibly secondary to pneumonia. Continue on IV levaquin. Mental status is back to baseline today   Acute hypoxic respiratory failure: pulse ox of 86% on room air. Weaned off of supplemental oxygen.  Sepsis: met criteria w/ leukocytosis, hypothermia & infected hidradenitis suppurativa, present on admission. Hold home dose of humira. Abxs were already d/c. Wound culture growing few gram-positive rods, few gram-positive cocci, few gram-negative rods, few corynebacterium stratum. Non-healing wounds as per pt's wife  ACD: H&H are labile. Transfused 1 unit of pRBCs so far this admission  ESRD: on HD MWF. Management as per nephro  Thrombocytopenia: etiology unclear. Will continue to monitor  Leukocytosis: infection vs reactive. Continue on IV levaquin    HLD: continue on statin  Seizure disorder: continue on keppra  DM2: likely poorly controlled. Continue on SSI  w/ accuchecks  Peripheral neuropathy: continue on home dose of gabapentin   Numerous pressure ulcers: present on admission. Continue w/ wound care  PVD: s/p b/l LE BKAs   Dementia: as per pt's wife. Continue w/ supportive care   DVT prophylaxis: SCDs Code Status: full  Family Communication: discussed pt's care w/ pt's wife, [name] and answered her  questions Disposition Plan: d/c back to home facility   Level of care: Telemetry Medical  Status is: Inpatient  Remains inpatient appropriate because: severity of illness, can likely d/c back to home facility tomorrow      Consultants:  Nephro   Procedures:   Antimicrobials: levaquin    Subjective: Pt c/o fatigue   Objective: Vitals:   11/10/21 1927 11/10/21 2034 11/11/21 0345 11/11/21 0756  BP:  (!) 136/55 (!) 146/65 (!) 159/74  Pulse: 64 62 64 73  Resp:  _0 Temp:  98.2 F (36.8 C) 98.2 F (36.8 C) 98.6 F (37 C)  TempSrc:  Oral  Oral  SpO2: 95% 97% 95% 97%  Weight:      Height:        Intake/Output Summary (Last 24 hours) at 11/11/2021 0807 Last data filed at 11/10/2021 1536 Gross per 24 hour  Intake 496.67 ml  Output --  Net 496.67 ml   Filed Weights   11/06/21 0958 11/09/21 0928 11/09/21 1252  Weight: 61.6 kg 63.1 kg 60.1 kg    Examination:  General exam: Appears calm and comfortable  Respiratory system: decreased breath sounds b/l otherwise clear Cardiovascular system: S1 & S2 +. No rubs, gallops or clicks.  Gastrointestinal system: Abdomen is nondistended, soft and nontender.  Normal bowel sounds heard. Central nervous system: Alert and oriented. Moves all extremities  Psychiatry: Judgement and insight appear normal. Flat mood and affect     Data Reviewed: I have personally reviewed following labs and imaging studies  CBC: Recent Labs  Lab 11/06/21 1008 11/07/21 0459 11/08/21 7471 11/09/21 0539 11/10/21 0431 11/11/21 8550  WBC 19.8* 12.2* 16.6* 21.5* 11.7* 12.2*  NEUTROABS 18.4*  --   --   --   --   --   HGB 7.5* 6.9* 8.2* 8.6* 7.3* 8.2*  HCT 24.8* 22.6* 26.3* 27.2* 23.3* 25.6*  MCV 91.9 90.4 88.9 88.9 88.3 88.0  PLT 205 195 223 206 157 295*   Basic Metabolic Panel: Recent Labs  Lab 11/06/21 1008 11/07/21 0459 11/08/21 0822 11/09/21 0539 11/11/21 0439  NA 134* 136 137 135 137  K 4.3 3.6 4.4 4.9 3.5  CL 94* 95* 95*  96* 99  CO2 _0 GLUCOSE 132* 150* 159* 240* 108*  BUN 43* 27* 44* 62* 48*  CREATININE 4.16* 2.73* 3.71* 4.77* 4.23*  CALCIUM 9.2 8.8* 9.1 9.1 8.9   GFR: Estimated Creatinine Clearance: 16 mL/min (A) (by C-G formula based on SCr of 4.23 mg/dL (H)). Liver Function Tests: Recent Labs  Lab 11/06/21 1008  AST 15  ALT 9  ALKPHOS 80  BILITOT 0.2*  PROT 7.1  ALBUMIN 2.2*   No results for input(s): LIPASE, AMYLASE in the last 168 hours. No results for input(s): AMMONIA in the last 168 hours. Coagulation Profile: Recent Labs  Lab 11/07/21 0459  INR 1.2   Cardiac Enzymes: No results for input(s): CKTOTAL, CKMB, CKMBINDEX, TROPONINI in the last 168 hours. BNP (last 3 results) No results for input(s): PROBNP in the last 8760 hours. HbA1C: No results for input(s): HGBA1C in the last 72 hours. CBG: Recent Labs  Lab 11/09/21 2339 11/10/21 0402 11/10/21 1929 11/10/21 2131 11/11/21 0012  GLUCAP 161* 140* 177* 146* 120*   Lipid Profile: No results for input(s): CHOL, HDL, LDLCALC, TRIG, CHOLHDL, LDLDIRECT in the last 72 hours. Thyroid Function Tests: No results for input(s): TSH, T4TOTAL, FREET4, T3FREE, THYROIDAB in the last 72 hours. Anemia Panel: No results for input(s): VITAMINB12, FOLATE, FERRITIN, TIBC, IRON, RETICCTPCT in the last 72 hours. Sepsis Labs: Recent Labs  Lab 11/06/21 1221 11/07/21 0459  PROCALCITON  --  30.43  LATICACIDVEN 0.8  --     Recent Results (from the past 240 hour(s))  Resp Panel by RT-PCR (Flu A&B, Covid) Nasopharyngeal Swab     Status: None   Collection Time: 11/06/21 11:14 AM   Specimen: Nasopharyngeal Swab; Nasopharyngeal(NP) swabs in vial transport medium  Result Value Ref Range Status   SARS Coronavirus 2 by RT PCR NEGATIVE NEGATIVE Final    Comment: (NOTE) SARS-CoV-2 target nucleic acids are NOT DETECTED.  The SARS-CoV-2 RNA is generally detectable in upper respiratory specimens during the acute phase of infection. The  lowest concentration of SARS-CoV-2 viral copies this assay can detect is 138 copies/mL. A negative result does not preclude SARS-Cov-2 infection and should not be used as the sole basis for treatment or other patient management decisions. A negative result may occur with  improper specimen collection/handling, submission of specimen other than nasopharyngeal swab, presence of viral mutation(s) within the areas targeted by this assay, and inadequate number of viral copies(<138 copies/mL). A negative result must be combined with clinical observations, patient history, and epidemiological information. The expected result is Negative.  Fact Sheet for Patients:  EntrepreneurPulse.com.au  Fact Sheet for Healthcare Providers:  IncredibleEmployment.be  This test is no t yet approved or cleared by the [place] FDA and  has been authorized for detection and/or diagnosis of SARS-CoV-2 by FDA under an Emergency Use Authorization (EUA). This EUA will remain  in effect (meaning this test can be used) for the duration  of the COVID-19 declaration under Section 564(b)(1) of the Act, 21 U.S.C.section 360bbb-3(b)(1), unless the authorization is terminated  or revoked sooner.       Influenza A by PCR NEGATIVE NEGATIVE Final   Influenza B by PCR NEGATIVE NEGATIVE Final    Comment: (NOTE) The Xpert Xpress SARS-CoV-2/FLU/RSV plus assay is intended as an aid in the diagnosis of influenza from Nasopharyngeal swab specimens and should not be used as a sole basis for treatment. Nasal washings and aspirates are unacceptable for Xpert Xpress SARS-CoV-2/FLU/RSV testing.  Fact Sheet for Patients: EntrepreneurPulse.com.au  Fact Sheet for Healthcare Providers: IncredibleEmployment.be  This test is not yet approved or cleared by the [place] FDA and has been authorized for detection and/or diagnosis of SARS-CoV-2 by FDA under  an Emergency Use Authorization (EUA). This EUA will remain in effect (meaning this test can be used) for the duration of the COVID-19 declaration under Section 564(b)(1) of the Act, 21 U.S.C. section 360bbb-3(b)(1), unless the authorization is terminated or revoked.  Performed at [facility], [address] [place], [place] [address]   Culture, blood (single)     Status: None (Preliminary result)   Collection Time: 11/06/21 12:21 PM   Specimen: BLOOD  Result Value Ref Range Status   Specimen Description BLOOD BLOOD RIGHT ARM  Final   Special Requests   Final    BOTTLES DRAWN AEROBIC AND ANAEROBIC Blood Culture results may not be optimal due to an excessive volume of blood received in culture bottles   Culture   Final    NO GROWTH 4 DAYS Performed at [facility], [address], [place], [place] [address]    Report Status PENDING  Incomplete  MRSA Next Gen by PCR, Nasal     Status: None   Collection Time: 11/07/21  5:08 AM   Specimen: Nasal Mucosa; Nasal Swab  Result Value Ref Range Status   MRSA by PCR Next Gen NOT DETECTED NOT DETECTED Final    Comment: (NOTE) The GeneXpert MRSA Assay (FDA approved for NASAL specimens only), is one component of a comprehensive MRSA colonization surveillance program. It is not intended to diagnose MRSA infection nor to guide or monitor treatment for MRSA infections. Test performance is not FDA approved in patients less than 27 years old. Performed at [facility], [address] [place], [place] [address]   Aerobic/Anaerobic Culture w Gram Stain (surgical/deep wound)     Status: None (Preliminary result)   Collection Time: 11/07/21 11:30 AM   Specimen: Buttocks; Abscess  Result Value Ref Range Status   Specimen Description   Final    BUTTOCKS Performed at [facility], [address] [place], [place] [address]    Special Requests   Final    Normal Performed at [facility], [place], [place] [address]    Gram Stain   Final    RARE WBC PRESENT,BOTH PMN AND MONONUCLEAR FEW GRAM POSITIVE RODS FEW GRAM POSITIVE COCCI FEW GRAM NEGATIVE RODS    Culture   Final    FEW CORYNEBACTERIUM STRIATUM Standardized susceptibility testing for this organism is not available. HOLDING FOR POSSIBLE ANAEROBE Performed at [facility], [place] [address], [place], [place] [address]    Report Status PENDING  Incomplete         Radiology Studies: No results found.      Scheduled Meds:  amLODipine  10 mg Oral QPM   atorvastatin  80 mg Oral Daily  carvedilol  25 mg Oral BID   cloNIDine  0.1 mg Oral BID   epoetin (EPOGEN/PROCRIT) injection  8,000 Units Intravenous Q M,W,F-HD   feeding supplement  237 mL Oral TID BM   gabapentin  100 mg Oral QHS   hydrALAZINE  100 mg Oral Q8H   insulin aspart  2 Units Subcutaneous TID WC   isosorbide mononitrate  30 mg Oral Daily   levETIRAcetam  750 mg Oral Q1200   levofloxacin  500 mg Oral Q48H   lisinopril  40 mg Oral Daily   multivitamin  1 tablet Oral QHS   sevelamer carbonate  800 mg Oral TID WC   Continuous Infusions:  sodium chloride 10 mL/hr at 11/06/21 2233     LOS: 5 days    Time spent: 30 mins     Wyvonnia Dusky, MD [facility] Pager [phone]  If 7PM-7AM, please contact night-coverage 11/11/2021, 8:07 AM

## 2021-11-11 NOTE — Progress Notes (Signed)
[facility]  ROUNDING NOTE   Subjective:   [name] is a 61 y.o. male with a past medical history of anemia, diabetes, hypertension, stroke, bilateral BKA, chronic systolic heart failure, and end stage renal disease on dialysis. Patient presents from [facility] with reported glucose of 39. He is also found to have increased WBCs on labs.   Patient seen resting quietly in bed Poor appetite remains Denies shortness of breath  Dialysis scheduled for later today   Objective:  Vital signs in last 24 hours:  Temp:  [98.2 F (36.8 C)-98.6 F (37 C)] 98.6 F (37 C) (01/25 0756) Pulse Rate:  [60-73] 73 (01/25 0756) Resp:  [16-21] 18 (01/25 1318) BP: (136-159)/(55-74) 159/74 (01/25 0756) SpO2:  [95 %-100 %] 97 % (01/25 0756)  Weight change:  Filed Weights   11/06/21 0958 11/09/21 0928 11/09/21 1252  Weight: 61.6 kg 63.1 kg 60.1 kg    Intake/Output: I/O last 3 completed shifts: In: 496.7 [P.O.:120; I.V.:10; Blood:366.7] Out: -    Intake/Output this shift:  Total I/O In: 240 [P.O.:240] Out: -   Physical Exam: General: In no acute distress  Head: Moist oral mucosal membranes  Eyes: Anicteric  Lungs:  Normal effort and symmetrical respiratory effort, lungs clear  Heart: S1S2,no rubs or gallops  Abdomen:  Soft, nontender,non distended  Extremities: No peripheral edema.Bilateral BKA  Neurologic: Awake,alert,oriented  Skin: No lesions or rashes  Access: Left AVF    Basic Metabolic Panel: Recent Labs  Lab 11/06/21 1008 11/07/21 0459 11/08/21 0822 11/09/21 0539 11/11/21 0439  NA 134* 136 137 135 137  K 4.3 3.6 4.4 4.9 3.5  CL 94* 95* 95* 96* 99  CO2 29 29 29 24 28   GLUCOSE 132* 150* 159* 240* 108*  BUN 43* 27* 44* 62* 48*  CREATININE 4.16* 2.73* 3.71* 4.77* 4.23*  CALCIUM 9.2 8.8* 9.1 9.1 8.9     Liver Function Tests: Recent Labs  Lab 11/06/21 1008  AST 15  ALT 9  ALKPHOS 80  BILITOT 0.2*  PROT 7.1  ALBUMIN 2.2*    No results  for input(s): LIPASE, AMYLASE in the last 168 hours. No results for input(s): AMMONIA in the last 168 hours.  CBC: Recent Labs  Lab 11/06/21 1008 11/07/21 0459 11/08/21 0822 11/09/21 0539 11/10/21 0431 11/11/21 0439  WBC 19.8* 12.2* 16.6* 21.5* 11.7* 12.2*  NEUTROABS 18.4*  --   --   --   --   --   HGB 7.5* 6.9* 8.2* 8.6* 7.3* 8.2*  HCT 24.8* 22.6* 26.3* 27.2* 23.3* 25.6*  MCV 91.9 90.4 88.9 88.9 88.3 88.0  PLT 205 195 223 206 157 137*     Cardiac Enzymes: No results for input(s): CKTOTAL, CKMB, CKMBINDEX, TROPONINI in the last 168 hours.  BNP: Invalid input(s): POCBNP  CBG: Recent Labs  Lab 11/09/21 2339 11/10/21 0402 11/10/21 1929 11/10/21 2131 11/11/21 0012  GLUCAP 161* 140* 177* 146* 120*     Microbiology: Results for orders placed or performed during the hospital encounter of 11/06/21  Resp Panel by RT-PCR (Flu A&B, Covid) Nasopharyngeal Swab     Status: None   Collection Time: 11/06/21 11:14 AM   Specimen: Nasopharyngeal Swab; Nasopharyngeal(NP) swabs in vial transport medium  Result Value Ref Range Status   SARS Coronavirus 2 by RT PCR NEGATIVE NEGATIVE Final    Comment: (NOTE) SARS-CoV-2 target nucleic acids are NOT DETECTED.  The SARS-CoV-2 RNA is generally detectable in upper respiratory specimens during the acute phase of  infection. The lowest concentration of SARS-CoV-2 viral copies this assay can detect is 138 copies/mL. A negative result does not preclude SARS-Cov-2 infection and should not be used as the sole basis for treatment or other patient management decisions. A negative result may occur with  improper specimen collection/handling, submission of specimen other than nasopharyngeal swab, presence of viral mutation(s) within the areas targeted by this assay, and inadequate number of viral copies(<138 copies/mL). A negative result must be combined with clinical observations, patient history, and epidemiological information. The expected  result is Negative.  Fact Sheet for Patients:  EntrepreneurPulse.com.au  Fact Sheet for Healthcare Providers:  IncredibleEmployment.be  This test is no t yet approved or cleared by the [place] FDA and  has been authorized for detection and/or diagnosis of SARS-CoV-2 by FDA under an Emergency Use Authorization (EUA). This EUA will remain  in effect (meaning this test can be used) for the duration of the COVID-19 declaration under Section 564(b)(1) of the Act, 21 U.S.C.section 360bbb-3(b)(1), unless the authorization is terminated  or revoked sooner.       Influenza A by PCR NEGATIVE NEGATIVE Final   Influenza B by PCR NEGATIVE NEGATIVE Final    Comment: (NOTE) The Xpert Xpress SARS-CoV-2/FLU/RSV plus assay is intended as an aid in the diagnosis of influenza from Nasopharyngeal swab specimens and should not be used as a sole basis for treatment. Nasal washings and aspirates are unacceptable for Xpert Xpress SARS-CoV-2/FLU/RSV testing.  Fact Sheet for Patients: EntrepreneurPulse.com.au  Fact Sheet for Healthcare Providers: IncredibleEmployment.be  This test is not yet approved or cleared by the [place] FDA and has been authorized for detection and/or diagnosis of SARS-CoV-2 by FDA under an Emergency Use Authorization (EUA). This EUA will remain in effect (meaning this test can be used) for the duration of the COVID-19 declaration under Section 564(b)(1) of the Act, 21 U.S.C. section 360bbb-3(b)(1), unless the authorization is terminated or revoked.  Performed at [facility], [address] [place], [place] [address]   Culture, blood (single)     Status: None (Preliminary result)   Collection Time: 11/06/21 12:21 PM   Specimen: BLOOD  Result Value Ref Range Status   Specimen Description BLOOD BLOOD RIGHT ARM  Final   Special Requests   Final    BOTTLES DRAWN AEROBIC AND  ANAEROBIC Blood Culture results may not be optimal due to an excessive volume of blood received in culture bottles   Culture   Final    NO GROWTH 4 DAYS Performed at [facility], [address], [place], [place] [address]    Report Status PENDING  Incomplete  MRSA Next Gen by PCR, Nasal     Status: None   Collection Time: 11/07/21  5:08 AM   Specimen: Nasal Mucosa; Nasal Swab  Result Value Ref Range Status   MRSA by PCR Next Gen NOT DETECTED NOT DETECTED Final    Comment: (NOTE) The GeneXpert MRSA Assay (FDA approved for NASAL specimens only), is one component of a comprehensive MRSA colonization surveillance program. It is not intended to diagnose MRSA infection nor to guide or monitor treatment for MRSA infections. Test performance is not FDA approved in patients less than 13 years old. Performed at [facility], [address] [place], [place] [address]   Aerobic/Anaerobic Culture w Gram Stain (surgical/deep wound)     Status: None   Collection Time: 11/07/21 11:30 AM   Specimen: Buttocks; Abscess  Result Value Ref Range Status   Specimen  Description   Final    BUTTOCKS Performed at [facility], [address], [place], [place] [address]    Special Requests   Final    Normal Performed at [facility], [place], [place] [address]    Gram Stain   Final    RARE WBC PRESENT,BOTH PMN AND MONONUCLEAR FEW GRAM POSITIVE RODS FEW GRAM POSITIVE COCCI FEW GRAM NEGATIVE RODS    Culture   Final    FEW CORYNEBACTERIUM STRIATUM Standardized susceptibility testing for this organism is not available. FEW BACTEROIDES OVATUS BETA LACTAMASE POSITIVE Performed at [facility], [place] [address], [place], [place] [address]    Report Status 11/11/2021 FINAL  Final    Coagulation Studies: No results for input(s): LABPROT, INR in the last 72 hours.   Urinalysis: No results for input(s): COLORURINE, LABSPEC, PHURINE, GLUCOSEU,  HGBUR, BILIRUBINUR, KETONESUR, PROTEINUR, UROBILINOGEN, NITRITE, LEUKOCYTESUR in the last 72 hours.  Invalid input(s): APPERANCEUR    Imaging: No results found.   Medications:    sodium chloride 10 mL/hr at 11/06/21 2233    amLODipine  10 mg Oral QPM   atorvastatin  80 mg Oral Daily   carvedilol  25 mg Oral BID   cloNIDine  0.1 mg Oral BID   epoetin (EPOGEN/PROCRIT) injection  8,000 Units Intravenous Q M,W,F-HD   feeding supplement  237 mL Oral TID BM   gabapentin  100 mg Oral QHS   hydrALAZINE  100 mg Oral Q8H   insulin aspart  2 Units Subcutaneous TID WC   isosorbide mononitrate  30 mg Oral Daily   levETIRAcetam  750 mg Oral Q1200   levofloxacin  500 mg Oral Q48H   lisinopril  40 mg Oral Daily   multivitamin  1 tablet Oral QHS   sevelamer carbonate  800 mg Oral TID WC     Assessment/ Plan:  [name] is a 61 y.o.  male  with a past medical history of anemia, diabetes, hypertension, stroke, bilateral BKA, chronic systolic heart failure, and end stage renal disease on dialysis. Patient presents from [facility] with reported glucose of 39. He is also found to have increased WBCs on labs.  [facility] [address] MWF L AVF /61 kg  #End-stage renal disease on hemodialysis.  Will maintain outpatient schedule during this admission.  Plan to receive dialysis later today, UF goal 1 to 1.5 L as tolerated.  Next scheduled treatment on Friday.   #Diabetes mellitus type II with chronic kidney disease insulin dependent. Home regimen includes Lantus, NovoLog, and [name].  Lab Results  Component Value Date   HGBA1C 6.5 (H) 07/29/2021  Glucose stable Primary team to manage SSI   #Anemia of chronic kidney disease  Lab Results  Component Value Date   HGB 8.2 (L) 11/11/2021   Patient received blood transfusion this admission. Continue EPO 8000 units IV with dialysis.   #Secondary Hyperparathyroidism  Lab Results  Component Value Date   PTH 283 (H)  01/08/2019   CALCIUM 8.9 11/11/2021   CAION 1.10 (L) 09/12/2020   PHOS 6.9 (H) 05/19/2021  We will continue to monitor bone minerals during this admission Continue Sevelamer 800 mg with meals  #Hidradenitis of the buttocks. Chronic seropurulent drainage Currently being treated with broad-spectrum antibiotics.   LOS: 5   1/25/20232:21 PM

## 2021-11-12 LAB — BASIC METABOLIC PANEL
Anion gap: 8 (ref 5–15)
BUN: 25 mg/dL — ABNORMAL HIGH (ref 6–20)
CO2: 32 mmol/L (ref 22–32)
Calcium: 8.3 mg/dL — ABNORMAL LOW (ref 8.9–10.3)
Chloride: 97 mmol/L — ABNORMAL LOW (ref 98–111)
Creatinine, Ser: 2.84 mg/dL — ABNORMAL HIGH (ref 0.61–1.24)
GFR, Estimated: 25 mL/min — ABNORMAL LOW (ref >60)
Glucose, Bld: 121 mg/dL — ABNORMAL HIGH (ref 70–99)
Potassium: 3.1 mmol/L — ABNORMAL LOW (ref 3.5–5.1)
Sodium: 137 mmol/L (ref 135–145)

## 2021-11-12 LAB — CBC
HCT: 25 % — ABNORMAL LOW (ref 39.0–52.0)
Hemoglobin: 7.8 g/dL — ABNORMAL LOW (ref 13.0–17.0)
MCH: 27.6 pg (ref 26.0–34.0)
MCHC: 31.2 g/dL (ref 30.0–36.0)
MCV: 88.3 fL (ref 80.0–100.0)
Platelets: 162 10*3/uL (ref 150–400)
RBC: 2.83 MIL/uL — ABNORMAL LOW (ref 4.22–5.81)
RDW: 17 % — ABNORMAL HIGH (ref 11.5–15.5)
WBC: 11.9 10*3/uL — ABNORMAL HIGH (ref 4.0–10.5)
nRBC: 0 % (ref 0.0–0.2)

## 2021-11-12 LAB — GLUCOSE, CAPILLARY
Glucose-Capillary: 97 mg/dL (ref 70–99)
Glucose-Capillary: 146 mg/dL — ABNORMAL HIGH (ref 70–99)

## 2021-11-12 MED ORDER — LEVOFLOXACIN 500 MG PO TABS: 500.0000 mg | ORAL_TABLET | ORAL | 0 refills | Status: AC

## 2021-11-12 MED ORDER — ACETAMINOPHEN 325 MG PO TABS
650.0000 mg | ORAL_TABLET | Freq: Once | ORAL | Status: AC
  Administered 2021-11-12: 650 mg via ORAL
  Filled 2021-11-12: qty 2

## 2021-11-12 NOTE — Progress Notes (Signed)
[facility]  ROUNDING NOTE   Subjective:   [name] is a 61 y.o. male with a past medical history of anemia, diabetes, hypertension, stroke, bilateral BKA, chronic systolic heart failure, and end stage renal disease on dialysis. Patient presents from [facility] with reported glucose of 39. He is also found to have increased WBCs on labs.   Patient seen resting in bed, alert and oriented Denies shortness of breath Tolerating meals without nausea and vomiting  Dialysis received yesterday, tolerated well   Objective:  Vital signs in last 24 hours:  Temp:  [98.2 F (36.8 C)-98.5 F (36.9 C)] 98.5 F (36.9 C) (01/26 1401) Pulse Rate:  [56-78] 70 (01/26 1401) Resp:  [14-26] 16 (01/26 1401) BP: (131-159)/(63-78) 131/65 (01/26 1401) SpO2:  [94 %-100 %] 100 % (01/26 1401) Weight:  [58.2 kg] 58.2 kg (01/25 1730)  Weight change:  Filed Weights   11/09/21 1252 11/11/21 1430 11/11/21 1730  Weight: 60.1 kg 56.1 kg 58.2 kg    Intake/Output: I/O last 3 completed shifts: In: 240 [P.O.:240] Out: 1505 [Other:1505]   Intake/Output this shift:  Total I/O In: 240 [P.O.:240] Out: -   Physical Exam: General: In no acute distress  Head: Moist oral mucosal membranes  Eyes: Anicteric  Lungs:  Normal effort and symmetrical respiratory effort, lungs clear  Heart: S1S2,no rubs or gallops  Abdomen:  Soft, nontender,non distended  Extremities: No peripheral edema.Bilateral BKA  Neurologic: Awake,alert,oriented  Skin: No lesions or rashes  Access: Left AVF    Basic Metabolic Panel: Recent Labs  Lab 11/07/21 0459 11/08/21 0822 11/09/21 0539 11/11/21 0439 11/12/21 0429  NA 136 137 135 137 137  K 3.6 4.4 4.9 3.5 3.1*  CL 95* 95* 96* 99 97*  CO2 29 29 24 28  32  GLUCOSE 150* 159* 240* 108* 121*  BUN 27* 44* 62* 48* 25*  CREATININE 2.73* 3.71* 4.77* 4.23* 2.84*  CALCIUM 8.8* 9.1 9.1 8.9 8.3*     Liver Function Tests: Recent Labs  Lab 11/06/21 1008  AST  15  ALT 9  ALKPHOS 80  BILITOT 0.2*  PROT 7.1  ALBUMIN 2.2*    No results for input(s): LIPASE, AMYLASE in the last 168 hours. No results for input(s): AMMONIA in the last 168 hours.  CBC: Recent Labs  Lab 11/06/21 1008 11/07/21 0459 11/08/21 0822 11/09/21 0539 11/10/21 0431 11/11/21 0439 11/12/21 0429  WBC 19.8*   < > 16.6* 21.5* 11.7* 12.2* 11.9*  NEUTROABS 18.4*  --   --   --   --   --   --   HGB 7.5*   < > 8.2* 8.6* 7.3* 8.2* 7.8*  HCT 24.8*   < > 26.3* 27.2* 23.3* 25.6* 25.0*  MCV 91.9   < > 88.9 88.9 88.3 88.0 88.3  PLT 205   < > 223 206 157 137* 162   < > = values in this interval not displayed.     Cardiac Enzymes: No results for input(s): CKTOTAL, CKMB, CKMBINDEX, TROPONINI in the last 168 hours.  BNP: Invalid input(s): POCBNP  CBG: Recent Labs  Lab 11/10/21 2131 11/11/21 0012 11/11/21 2258 11/12/21 0813 11/12/21 1151  GLUCAP 146* 120* 189* 97 146*     Microbiology: Results for orders placed or performed during the hospital encounter of 11/06/21  Resp Panel by RT-PCR (Flu A&B, Covid) Nasopharyngeal Swab     Status: None   Collection Time: 11/06/21 11:14 AM   Specimen: Nasopharyngeal Swab; Nasopharyngeal(NP) swabs in vial  transport medium  Result Value Ref Range Status   SARS Coronavirus 2 by RT PCR NEGATIVE NEGATIVE Final    Comment: (NOTE) SARS-CoV-2 target nucleic acids are NOT DETECTED.  The SARS-CoV-2 RNA is generally detectable in upper respiratory specimens during the acute phase of infection. The lowest concentration of SARS-CoV-2 viral copies this assay can detect is 138 copies/mL. A negative result does not preclude SARS-Cov-2 infection and should not be used as the sole basis for treatment or other patient management decisions. A negative result may occur with  improper specimen collection/handling, submission of specimen other than nasopharyngeal swab, presence of viral mutation(s) within the areas targeted by this assay, and  inadequate number of viral copies(<138 copies/mL). A negative result must be combined with clinical observations, patient history, and epidemiological information. The expected result is Negative.  Fact Sheet for Patients:  EntrepreneurPulse.com.au  Fact Sheet for Healthcare Providers:  IncredibleEmployment.be  This test is no t yet approved or cleared by the [place] FDA and  has been authorized for detection and/or diagnosis of SARS-CoV-2 by FDA under an Emergency Use Authorization (EUA). This EUA will remain  in effect (meaning this test can be used) for the duration of the COVID-19 declaration under Section 564(b)(1) of the Act, 21 U.S.C.section 360bbb-3(b)(1), unless the authorization is terminated  or revoked sooner.       Influenza A by PCR NEGATIVE NEGATIVE Final   Influenza B by PCR NEGATIVE NEGATIVE Final    Comment: (NOTE) The Xpert Xpress SARS-CoV-2/FLU/RSV plus assay is intended as an aid in the diagnosis of influenza from Nasopharyngeal swab specimens and should not be used as a sole basis for treatment. Nasal washings and aspirates are unacceptable for Xpert Xpress SARS-CoV-2/FLU/RSV testing.  Fact Sheet for Patients: EntrepreneurPulse.com.au  Fact Sheet for Healthcare Providers: IncredibleEmployment.be  This test is not yet approved or cleared by the [place] FDA and has been authorized for detection and/or diagnosis of SARS-CoV-2 by FDA under an Emergency Use Authorization (EUA). This EUA will remain in effect (meaning this test can be used) for the duration of the COVID-19 declaration under Section 564(b)(1) of the Act, 21 U.S.C. section 360bbb-3(b)(1), unless the authorization is terminated or revoked.  Performed at [facility], [address] [place], [place] [address]   Culture, blood (single)     Status: None (Preliminary result)   Collection Time: 11/06/21  12:21 PM   Specimen: BLOOD  Result Value Ref Range Status   Specimen Description BLOOD BLOOD RIGHT ARM  Final   Special Requests   Final    BOTTLES DRAWN AEROBIC AND ANAEROBIC Blood Culture results may not be optimal due to an excessive volume of blood received in culture bottles   Culture   Final    NO GROWTH 4 DAYS Performed at [facility], [address], [place], [place] [address]    Report Status PENDING  Incomplete  MRSA Next Gen by PCR, Nasal     Status: None   Collection Time: 11/07/21  5:08 AM   Specimen: Nasal Mucosa; Nasal Swab  Result Value Ref Range Status   MRSA by PCR Next Gen NOT DETECTED NOT DETECTED Final    Comment: (NOTE) The GeneXpert MRSA Assay (FDA approved for NASAL specimens only), is one component of a comprehensive MRSA colonization surveillance program. It is not intended to diagnose MRSA infection nor to guide or monitor treatment for MRSA infections. Test performance is not FDA approved in patients less than 104 years old. Performed  at [facility], [address] [place], [place] [address]   Aerobic/Anaerobic Culture w Gram Stain (surgical/deep wound)     Status: None   Collection Time: 11/07/21 11:30 AM   Specimen: Buttocks; Abscess  Result Value Ref Range Status   Specimen Description   Final    BUTTOCKS Performed at [facility], [address] [place], [place] [address]    Special Requests   Final    Normal Performed at [facility], [place], [place] [address]    Gram Stain   Final    RARE WBC PRESENT,BOTH PMN AND MONONUCLEAR FEW GRAM POSITIVE RODS FEW GRAM POSITIVE COCCI FEW GRAM NEGATIVE RODS    Culture   Final    FEW CORYNEBACTERIUM STRIATUM Standardized susceptibility testing for this organism is not available. FEW BACTEROIDES OVATUS BETA LACTAMASE POSITIVE Performed at [facility], [place] [address] [place], [place] [address]    Report Status 11/11/2021 FINAL  Final   Resp Panel by RT-PCR (Flu A&B, Covid) Nasopharyngeal Swab     Status: None   Collection Time: 11/11/21  6:30 PM   Specimen: Nasopharyngeal Swab; Nasopharyngeal(NP) swabs in vial transport medium  Result Value Ref Range Status   SARS Coronavirus 2 by RT PCR NEGATIVE NEGATIVE Final    Comment: (NOTE) SARS-CoV-2 target nucleic acids are NOT DETECTED.  The SARS-CoV-2 RNA is generally detectable in upper respiratory specimens during the acute phase of infection. The lowest concentration of SARS-CoV-2 viral copies this assay can detect is 138 copies/mL. A negative result does not preclude SARS-Cov-2 infection and should not be used as the sole basis for treatment or other patient management decisions. A negative result may occur with  improper specimen collection/handling, submission of specimen other than nasopharyngeal swab, presence of viral mutation(s) within the areas targeted by this assay, and inadequate number of viral copies(<138 copies/mL). A negative result must be combined with clinical observations, patient history, and epidemiological information. The expected result is Negative.  Fact Sheet for Patients:  EntrepreneurPulse.com.au  Fact Sheet for Healthcare Providers:  IncredibleEmployment.be  This test is no t yet approved or cleared by the [place] FDA and  has been authorized for detection and/or diagnosis of SARS-CoV-2 by FDA under an Emergency Use Authorization (EUA). This EUA will remain  in effect (meaning this test can be used) for the duration of the COVID-19 declaration under Section 564(b)(1) of the Act, 21 U.S.C.section 360bbb-3(b)(1), unless the authorization is terminated  or revoked sooner.       Influenza A by PCR NEGATIVE NEGATIVE Final   Influenza B by PCR NEGATIVE NEGATIVE Final    Comment: (NOTE) The Xpert Xpress SARS-CoV-2/FLU/RSV plus assay is intended as an aid in the diagnosis of influenza from  Nasopharyngeal swab specimens and should not be used as a sole basis for treatment. Nasal washings and aspirates are unacceptable for Xpert Xpress SARS-CoV-2/FLU/RSV testing.  Fact Sheet for Patients: EntrepreneurPulse.com.au  Fact Sheet for Healthcare Providers: IncredibleEmployment.be  This test is not yet approved or cleared by the [place] FDA and has been authorized for detection and/or diagnosis of SARS-CoV-2 by FDA under an Emergency Use Authorization (EUA). This EUA will remain in effect (meaning this test can be used) for the duration of the COVID-19 declaration under Section 564(b)(1) of the Act, 21 U.S.C. section 360bbb-3(b)(1), unless the authorization is terminated or revoked.  Performed at [facility], [address] [place], [place] [address]     Coagulation  Studies: No results for input(s): LABPROT, INR in the last 72 hours.   Urinalysis: No results for input(s): COLORURINE, LABSPEC, PHURINE, GLUCOSEU, HGBUR, BILIRUBINUR, KETONESUR, PROTEINUR, UROBILINOGEN, NITRITE, LEUKOCYTESUR in the last 72 hours.  Invalid input(s): APPERANCEUR    Imaging: No results found.   Medications:    sodium chloride 10 mL/hr at 11/06/21 2233    amLODipine  10 mg Oral QPM   atorvastatin  80 mg Oral Daily   carvedilol  25 mg Oral BID   cloNIDine  0.1 mg Oral BID   epoetin (EPOGEN/PROCRIT) injection  8,000 Units Intravenous Q M,W,F-HD   feeding supplement  237 mL Oral TID BM   gabapentin  100 mg Oral QHS   hydrALAZINE  100 mg Oral Q8H   insulin aspart  2 Units Subcutaneous TID WC   isosorbide mononitrate  30 mg Oral Daily   levETIRAcetam  750 mg Oral Q1200   levofloxacin  500 mg Oral Q48H   lisinopril  40 mg Oral Daily   multivitamin  1 tablet Oral QHS   sevelamer carbonate  800 mg Oral TID WC     Assessment/ Plan:  [name] is a 61 y.o.  male  with a past medical history of anemia, diabetes,  hypertension, stroke, bilateral BKA, chronic systolic heart failure, and end stage renal disease on dialysis. Patient presents from [facility] with reported glucose of 39. He is also found to have increased WBCs on labs.  [facility] [address] MWF L AVF /61 kg  #End-stage renal disease on hemodialysis.  Will maintain outpatient schedule during this admission.  Patient received dialysis yesterday, UF goal 1.5 L achieved.  Next treatment scheduled for Friday.   #Diabetes mellitus type II with chronic kidney disease insulin dependent. Home regimen includes Lantus, NovoLog, and [name].  Lab Results  Component Value Date   HGBA1C 6.5 (H) 07/29/2021   Primary team to manage SSI   #Anemia of chronic kidney disease  Lab Results  Component Value Date   HGB 7.8 (L) 11/12/2021   Patient received blood transfusion this admission. EPO with dialysis.   #Secondary Hyperparathyroidism  Lab Results  Component Value Date   PTH 283 (H) 01/08/2019   CALCIUM 8.3 (L) 11/12/2021   CAION 1.10 (L) 09/12/2020   PHOS 6.9 (H) 05/19/2021  We will continue to monitor bone minerals during this admission Continue Sevelamer 800 mg with meals  #Hidradenitis of the buttocks. Chronic seropurulent drainage Currently being treated with broad-spectrum antibiotics.   LOS: [place] 1/26/20234:03 PM

## 2021-11-12 NOTE — Progress Notes (Signed)
CCMD called re Pt being off monitor to which staff went to room and replaced leads, not long after another call came from [place] about Pt again being off monitor. When staff spoke to Pt he informed that the leads were aggravating him and he would like to keep it off. NP was sought WRT toDC TELE, however it was decided to place TELE on stand by, Pt was educated on the plan and he stated that he would try to keep the leads on. CCMD, NP and CN were all made aware of the PT's request.

## 2021-11-12 NOTE — Progress Notes (Signed)
Patient discharging to facility- [facility].  Wife aware.  Discharge packet with instructions reviewed and report given to [facility] nurse who verbalized acceptance.  PIV removed prior to d/c.  Awaiting EMS to transport.  Discharge instructions in packet for facility.

## 2021-11-12 NOTE — Discharge Summary (Signed)
Physician Discharge Summary  °[name] MRN:[id number] DOB: 03/03/1961 DOA: 11/06/2021 ° °PCP: [name], Donald E, MD ° °Admit date: 11/06/2021 °Discharge date: 11/12/2021 ° °Admitted From: home facility  °Disposition:  home facility  ° °Recommendations for Outpatient Follow-up:  °Follow up with PCP in 1-2 weeks °F/u w/ nephro in 1-2 weeks ° °Home Health: no  °Equipment/Devices: ° °Discharge Condition: stable  °CODE STATUS: full  °Diet recommendation: Carb Modified / renal ° °Brief/Interim Summary: °HPI was taken from Dr. Agbata: °[name] is a 60 y.o. male with medical history significant for ESRD on HD (M/W/F), history of peripheral vascular disease status post bilateral BKA, hypertension, CVA, chronic systolic heart failure, history of anemia of chronic disease status post recent blood transfusion, who was sent to the ER from the skilled nursing facility where he resides for evaluation change in mental status.  Patient was noted to be hypoglycemic with a blood sugar of 39. °Patient's wife states that his oral intake has been very poor, he will drink liquids but refuses to eat most of the time.  She notes that his blood sugar was elevated the night prior to his admission in the 300s and he received insulin.  On the morning of his admission his blood sugar was 39 and so EMS was called. °I am unable to do a review of systems on this patient because he refuses to answer questions. °Sodium 134, potassium 4.3, chloride 94, bicarb 29, glucose 132, BUN 43, creatinine 4.16, calcium 9.2, alkaline phosphatase 80, albumin 2.2, AST 15, ALT 9, total protein 7.1, lactic acid 0.8, white count 19.8, hemoglobin 7.5, hematocrit 24.8, MCV 91.9, RDW 16.9, platelet count 205. °Respiratory viral panel is negative °CT scan of the pelvis with contrast shows extensive subcutaneous soft tissue thickening about the bilateral buttocks, anus, medial thighs and groin similar in appearance and in keeping with patient's known history of  hidradenitis somewhat asymmetrically worse on the left.  No discrete fluid collection.  Numerous enlarged bilateral inguinal lymph nodes, likely reactive to infection. °Chest x-ray reviewed by me shows patchy bilateral opacities with interstitial prominence which may reflect edema or multifocal pneumonia. °  °ED Course: Patient is a 60-year-old male with a history of end-stage renal disease on hemodialysis who was sent to the ER for evaluation of mental status. °Patient was noted to be hypoglycemic with blood sugar of 39 at the skilled nursing facility. This has improved and patient is more awake and alert and able to tolerate oral intake. °He has marked leukocytosis of 19,000 and was hypothermic when he arrived to ER.  He has findings suggestive of cellulitis around the sacral area and will be admitted to the hospital for further evaluation. ° °As per Dr. Wieting: °60-year-old man with past medical history of hydradenitis suppurativa, hypertension, intracranial hemorrhage, stroke, type 2 diabetes mellitus, cardiomyopathy, anemia and peripheral vascular disease status post bilateral BKA..  He presented with hypoglycemia and found to have clinical sepsis with draining hydradenitis suppurativa.  The patient was started on antibiotics.  The patient had some worsening mental status and was diagnosed with pneumonia and started on Levaquin yesterday. ° °Pressure Injury 05/20/21 Sacrum Medial Stage 2 -  Partial thickness loss of dermis presenting as a shallow open injury with a red, pink wound bed without slough. pink/red sloughing, superficial sin loss (Active)  °05/20/21 0800  °Location: Sacrum  °Location Orientation: Medial  °Staging: Stage 2 -  Partial thickness loss of dermis presenting as a shallow open injury with a red,   pink wound bed without slough.  °Wound Description (Comments): pink/red sloughing, superficial sin loss  °Present on Admission: Yes  °   °Pressure Injury 07/27/21 Sacrum Stage 2 -  Partial thickness  loss of dermis presenting as a shallow open injury with a red, pink wound bed without slough. 3x3cm stage 2 sacral decub present on admission (Active)  °07/27/21 1330  °Location: Sacrum  °Location Orientation:   °Staging: Stage 2 -  Partial thickness loss of dermis presenting as a shallow open injury with a red, pink wound bed without slough.  °Wound Description (Comments): 3x3cm stage 2 sacral decub present on admission  °Present on Admission: Yes  °   °Pressure Injury Thigh Distal;Posterior;Right Stage 1 -  Intact skin with non-blanchable redness of a localized area usually over a bony prominence. (Active)  °   °Location: Thigh  °Location Orientation: Distal;Posterior;Right  °Staging: Stage 1 -  Intact skin with non-blanchable redness of a localized area usually over a bony prominence.  °Wound Description (Comments):   °Present on Admission: Yes  ° ° °As per Dr.  1/25-1/26/23: Pt's mental status had returned to baseline. Pt will continue on levaquin for 2 more doses to complete his course of abxs for pneumonia. Pt will return to his home facility at d/c. For more information, please see previous progress/consult notes.   ° ° ° °Discharge Diagnoses:  °Principal Problem: °  Sepsis (HCC) °Active Problems: °  Hyperlipidemia °  Essential hypertension °  Hidradenitis suppurativa °  Type 2 diabetes mellitus with hypoglycemia without coma (HCC) °  ESRD (end stage renal disease) (HCC) °  S/P bilateral BKA (below knee amputation) (HCC) °  Chronic systolic CHF (congestive heart failure) (HCC) °  Type 2 diabetes mellitus with diabetic neuropathy, without long-term current use of insulin (HCC) °  Anemia in ESRD (end-stage renal disease) (HCC) °  Multifocal pneumonia ° °Acute metabolic encephalopathy:  possibly secondary to pneumonia. Continue on levaquin. Mental status is back to baseline  °  °Acute hypoxic respiratory failure: pulse ox of 86% on room air. Weaned off of supplemental oxygen. °  °Sepsis: met criteria w/  leukocytosis, hypothermia & infected hidradenitis suppurativa, present on admission. Restart home dose of humira at d/c. Abxs were already d/c. Wound culture growing few gram-positive rods, few gram-positive cocci, few gram-negative rods, few corynebacterium stratum. Non-healing wounds as per pt's wife °  °ACD: H&H are labile. Transfused 1 unit of pRBCs this admission °  °ESRD: on HD MWF. Management as per nephro °  °Thrombocytopenia: resolved  °  °Leukocytosis: infection vs reactive. Continue on IV levaquin   °  °HLD: continue on statin °  °Seizure disorder: continue on keppra °  °DM2: likely poorly controlled. Continue on home anti-DM meds  °  °Peripheral neuropathy: continue on home dose of gabapentin  °  °Numerous pressure ulcers: present on admission. Continue w/ wound care °  °PVD: s/p b/l LE BKAs  °  °Dementia: as per pt's wife. Continue w/ supportive care  ° ° °Discharge Instructions ° °Discharge Instructions   ° ° Diet Carb Modified   Complete by: As directed °  ° with a renal diet  ° Discharge instructions   Complete by: As directed °  ° F/u w/ PCP in 1-2 weeks. F/u w/ nephro in 1-2 weeks  ° Increase activity slowly   Complete by: As directed °  ° No wound care   Complete by: As directed °  ° °  ° °Allergies as of 11/12/2021   ° °     Reactions   Methotrexate Other (See Comments)   Blood count drops   Vancomycin Shortness Of Breath   Eyes watering, SOB, wheezing   Cefepime Other (See Comments)   Confusion    Tape         Medication List     STOP taking these medications    spironolactone 25 MG tablet Commonly known as: ALDACTONE       TAKE these medications    acetaminophen 500 MG tablet Commonly known as: TYLENOL Take 1,000 mg by mouth 2 (two) times daily as needed for moderate pain, headache, mild pain or fever.   Alcohol Swabs Pads Use as directed to check blood sugar three times daily for insulin dependent type 2 diabetes.   amLODipine 10 MG tablet Commonly known as:  NORVASC Take 1 tablet (10 mg total) by mouth every evening.   atorvastatin 80 MG tablet Commonly known as: LIPITOR TAKE 1 TABLET(80 MG) BY MOUTH DAILY   carvedilol 25 MG tablet Commonly known as: COREG Take 1 tablet (25 mg total) by mouth 2 (two) times daily.   cloNIDine 0.2 MG tablet Commonly known as: CATAPRES Take 0.5 tablets (0.1 mg total) by mouth 2 (two) times daily.   feeding supplement Liqd Take 237 mLs by mouth 3 (three) times daily between meals.   gabapentin 100 MG capsule Commonly known as: NEURONTIN Take 1 capsule (100 mg total) by mouth at bedtime.   [name] Pen 40 MG/0.8ML Pnkt Generic drug: Adalimumab Inject 40 mg into the skin once a week.   hydrALAZINE 100 MG tablet Commonly known as: APRESOLINE Take 1 tablet (100 mg total) by mouth every 8 (eight) hours.   Insulin Aspart FlexPen 100 UNIT/ML Commonly known as: NOVOLOG Inject 2-12 Units into the skin 4 (four) times daily -  before meals and at bedtime. Per sliding scale 200-249= 2 units 250-299= 4 units 300-349= 6 units 350-399= 8 units 400-449= 10 units  450-499= 12 units 500 or greater= Call MD   insulin glargine-yfgn 100 UNIT/ML injection Commonly known as: SEMGLEE Inject 0.05 mLs (5 Units total) into the skin daily.   isosorbide mononitrate 30 MG 24 hr tablet Commonly known as: IMDUR Take 30 mg by mouth daily.   levETIRAcetam 750 MG tablet Commonly known as: KEPPRA Take 750 mg by mouth daily at 12 noon.   levofloxacin 500 MG tablet Commonly known as: LEVAQUIN Take 1 tablet (500 mg total) by mouth every other day for 4 days. Start taking on: November 13, 2021   lisinopril 40 MG tablet Commonly known as: ZESTRIL Take 40 mg by mouth daily.   ONE TOUCH ULTRA 2 w/Device Kit Use as directed to check blood sugar three times daily. E11.9   oxymetazoline 0.05 % nasal spray Commonly known as: AFRIN Place 1 spray into both nostrils every 2 (two) hours as needed (breakthrough bleeding).    sevelamer carbonate 800 MG tablet Commonly known as: RENVELA TAKE 1 TABLET(800 MG) BY MOUTH THREE TIMES DAILY   THEREMS PO Take 1 tablet by mouth daily. multivitamin   traMADol 50 MG tablet Commonly known as: ULTRAM Take 50 mg by mouth 2 (two) times daily as needed.        Allergies  Allergen Reactions   Methotrexate Other (See Comments)    Blood count drops   Vancomycin Shortness Of Breath    Eyes watering, SOB, wheezing   Cefepime Other (See Comments)    Confusion    Tape     Consultations: Nephro General surg  Procedures/Studies: °DG Chest 2 View ° °Result Date: 11/06/2021 °CLINICAL DATA:  Leukocytosis, altered mental status EXAM: CHEST - 2 VIEW COMPARISON:  October 2022 FINDINGS: Patchy bilateral opacities with interstitial prominence. Trace pleural effusions. No pneumothorax. Similar cardiomegaly. Left innominate and subclavian venous stents. IMPRESSION: Patchy bilateral opacities with interstitial prominence may reflect edema or multifocal pneumonia. Similar cardiomegaly. Electronically Signed   By: Praneil  Patel M.D.   On: 11/06/2021 12:28  ° °CT PELVIS W CONTRAST ° °Result Date: 11/06/2021 °CLINICAL DATA:  History of hidradenitis, meeting sepsis criteria, pain from buttock wound, evaluate for underlying abscess EXAM: CT PELVIS WITH CONTRAST TECHNIQUE: Multidetector CT imaging of the pelvis was performed using the standard protocol following the bolus administration of intravenous contrast. RADIATION DOSE REDUCTION: This exam was performed according to the departmental dose-optimization program which includes automated exposure control, adjustment of the mA and/or kV according to patient size and/or use of iterative reconstruction technique. CONTRAST:  75mL OMNIPAQUE IOHEXOL 300 MG/ML  SOLN COMPARISON:  07/27/2021 FINDINGS: Urinary Tract:  No abnormality visualized. Bowel:  Large burden of stool in the distal colon and rectum. Vascular/Lymphatic: Numerous enlarged bilateral  inguinal lymph nodes, measuring up to 2.0 x 0.9 cm on the left (series 2, image 41) aortic atherosclerosis. Extensive vascular calcinosis. Reproductive:  No mass or other significant abnormality Other: Very extensive soft tissue thickening about the bilateral buttocks, anus, medial thighs, and groin, similar in appearance to prior examination, somewhat asymmetrically worse on the left. There is no discrete fluid collection appreciated. Musculoskeletal: No suspicious bone lesions identified. IMPRESSION: 1. Very extensive subcutaneous soft tissue thickening about the bilateral buttocks, anus, medial thighs, and groin, similar in appearance to prior examination and in keeping with hidradenitis, somewhat asymmetrically worse on the left. There is no discrete fluid collection appreciated. 2. Numerous enlarged bilateral inguinal lymph nodes, likely reactive to infection. 3. Large burden of stool in the distal colon and rectum. Aortic Atherosclerosis (ICD10-I70.0). Electronically Signed   By: Alex D Bibbey M.D.   On: 11/06/2021 12:50  ° °DG Chest Port 1 View ° °Result Date: 11/09/2021 °CLINICAL DATA:  60-year-old male with history of hypoxia. EXAM: PORTABLE CHEST 1 VIEW COMPARISON:  Chest x-ray 11/06/2021. FINDINGS: Lung volumes are low. Bibasilar opacities are noted with blunting of the costophrenic sulci bilaterally. Multifocal airspace consolidation scattered throughout the lungs bilaterally. Aeration has worsened. No pneumothorax. Pulmonary vasculature is obscured. Heart size is enlarged. The patient is rotated to the left on today's exam, resulting in distortion of the mediastinal contours and reduced diagnostic sensitivity and specificity for mediastinal pathology. Atherosclerotic calcifications are noted in the thoracic aorta. Stents are noted projecting over the left axillary region and upper mediastinum. IMPRESSION: 1. Worsening severe multilobar bilateral pneumonia with small bilateral parapneumonic pleural  effusions. 2. Mild cardiomegaly. 3. Aortic atherosclerosis. Electronically Signed   By: Daniel  Entrikin M.D.   On: 11/09/2021 08:11   °(Echo, Carotid, EGD, Colonoscopy, ERCP)  ° ° °Subjective: Pt c/o fatigue  ° ° °Discharge Exam: °Vitals:  ° 11/12/21 0410 11/12/21 0813  °BP: (!) 149/68 (!) 143/63  °Pulse: 62 (!) 56  °Resp: 20 20  °Temp: 98.3 °F (36.8 °C) 98.2 °F (36.8 °C)  °SpO2: 100% 100%  ° °Vitals:  ° 11/11/21 1826 11/11/21 2022 11/12/21 0410 11/12/21 0813  °BP: (!) 157/78 (!) 159/76 (!) 149/68 (!) 143/63  °Pulse: 72 78 62 (!) 56  °Resp: 18 20 20 20  °Temp: 98.3 °F (36.8 °C) 98.4 °F (36.9 °C) 98.3 °F (36.8 °C) 98.2 °F (  36.8 C)  TempSrc:    Oral  SpO2: 97% 100% 100% 100%  Weight:      Height:        General: Pt is alert, awake, not in acute distress Cardiovascular: S1/S2 +, no rubs, no gallops Respiratory: CTA bilaterally, no wheezing, no rhonchi Abdominal: Soft, NT, ND, bowel sounds + Extremities: no edema, no cyanosis of b/l LE stumps     The results of significant diagnostics from this hospitalization (including imaging, microbiology, ancillary and laboratory) are listed below for reference.     Microbiology: Recent Results (from the past 240 hour(s))  Resp Panel by RT-PCR (Flu A&B, Covid) Nasopharyngeal Swab     Status: None   Collection Time: 11/06/21 11:14 AM   Specimen: Nasopharyngeal Swab; Nasopharyngeal(NP) swabs in vial transport medium  Result Value Ref Range Status   SARS Coronavirus 2 by RT PCR NEGATIVE NEGATIVE Final    Comment: (NOTE) SARS-CoV-2 target nucleic acids are NOT DETECTED.  The SARS-CoV-2 RNA is generally detectable in upper respiratory specimens during the acute phase of infection. The lowest concentration of SARS-CoV-2 viral copies this assay can detect is 138 copies/mL. A negative result does not preclude SARS-Cov-2 infection and should not be used as the sole basis for treatment or other patient management decisions. A negative result may occur with   improper specimen collection/handling, submission of specimen other than nasopharyngeal swab, presence of viral mutation(s) within the areas targeted by this assay, and inadequate number of viral copies(<138 copies/mL). A negative result must be combined with clinical observations, patient history, and epidemiological information. The expected result is Negative.  Fact Sheet for Patients:  EntrepreneurPulse.com.au  Fact Sheet for Healthcare Providers:  IncredibleEmployment.be  This test is no t yet approved or cleared by the [place] FDA and  has been authorized for detection and/or diagnosis of SARS-CoV-2 by FDA under an Emergency Use Authorization (EUA). This EUA will remain  in effect (meaning this test can be used) for the duration of the COVID-19 declaration under Section 564(b)(1) of the Act, 21 U.S.C.section 360bbb-3(b)(1), unless the authorization is terminated  or revoked sooner.       Influenza A by PCR NEGATIVE NEGATIVE Final   Influenza B by PCR NEGATIVE NEGATIVE Final    Comment: (NOTE) The Xpert Xpress SARS-CoV-2/FLU/RSV plus assay is intended as an aid in the diagnosis of influenza from Nasopharyngeal swab specimens and should not be used as a sole basis for treatment. Nasal washings and aspirates are unacceptable for Xpert Xpress SARS-CoV-2/FLU/RSV testing.  Fact Sheet for Patients: EntrepreneurPulse.com.au  Fact Sheet for Healthcare Providers: IncredibleEmployment.be  This test is not yet approved or cleared by the [place] FDA and has been authorized for detection and/or diagnosis of SARS-CoV-2 by FDA under an Emergency Use Authorization (EUA). This EUA will remain in effect (meaning this test can be used) for the duration of the COVID-19 declaration under Section 564(b)(1) of the Act, 21 U.S.C. section 360bbb-3(b)(1), unless the authorization is terminated  or revoked.  Performed at [facility], [address] [place], [place] [address]   Culture, blood (single)     Status: None (Preliminary result)   Collection Time: 11/06/21 12:21 PM   Specimen: BLOOD  Result Value Ref Range Status   Specimen Description BLOOD BLOOD RIGHT ARM  Final   Special Requests   Final    BOTTLES DRAWN AEROBIC AND ANAEROBIC Blood Culture results may not be optimal due to an excessive volume of blood received in culture bottles  Culture   Final    NO GROWTH 4 DAYS Performed at [facility], [address] [place], [place] [address]    Report Status PENDING  Incomplete  MRSA Next Gen by PCR, Nasal     Status: None   Collection Time: 11/07/21  5:08 AM   Specimen: Nasal Mucosa; Nasal Swab  Result Value Ref Range Status   MRSA by PCR Next Gen NOT DETECTED NOT DETECTED Final    Comment: (NOTE) The GeneXpert MRSA Assay (FDA approved for NASAL specimens only), is one component of a comprehensive MRSA colonization surveillance program. It is not intended to diagnose MRSA infection nor to guide or monitor treatment for MRSA infections. Test performance is not FDA approved in patients less than 7 years old. Performed at [facility], [address] [place], [place] [address]   Aerobic/Anaerobic Culture w Gram Stain (surgical/deep wound)     Status: None   Collection Time: 11/07/21 11:30 AM   Specimen: Buttocks; Abscess  Result Value Ref Range Status   Specimen Description   Final    BUTTOCKS Performed at [facility], [address], [place], [place] [address]    Special Requests   Final    Normal Performed at [facility], [place], [place] [address]    Gram Stain   Final    RARE WBC PRESENT,BOTH PMN AND MONONUCLEAR FEW GRAM POSITIVE RODS FEW GRAM POSITIVE COCCI FEW GRAM NEGATIVE RODS    Culture   Final    FEW CORYNEBACTERIUM STRIATUM Standardized susceptibility testing for this  organism is not available. FEW BACTEROIDES OVATUS BETA LACTAMASE POSITIVE Performed at [facility], [place] [address] [place], [place] [address]    Report Status 11/11/2021 FINAL  Final  Resp Panel by RT-PCR (Flu A&B, Covid) Nasopharyngeal Swab     Status: None   Collection Time: 11/11/21  6:30 PM   Specimen: Nasopharyngeal Swab; Nasopharyngeal(NP) swabs in vial transport medium  Result Value Ref Range Status   SARS Coronavirus 2 by RT PCR NEGATIVE NEGATIVE Final    Comment: (NOTE) SARS-CoV-2 target nucleic acids are NOT DETECTED.  The SARS-CoV-2 RNA is generally detectable in upper respiratory specimens during the acute phase of infection. The lowest concentration of SARS-CoV-2 viral copies this assay can detect is 138 copies/mL. A negative result does not preclude SARS-Cov-2 infection and should not be used as the sole basis for treatment or other patient management decisions. A negative result may occur with  improper specimen collection/handling, submission of specimen other than nasopharyngeal swab, presence of viral mutation(s) within the areas targeted by this assay, and inadequate number of viral copies(<138 copies/mL). A negative result must be combined with clinical observations, patient history, and epidemiological information. The expected result is Negative.  Fact Sheet for Patients:  EntrepreneurPulse.com.au  Fact Sheet for Healthcare Providers:  IncredibleEmployment.be  This test is no t yet approved or cleared by the [place] FDA and  has been authorized for detection and/or diagnosis of SARS-CoV-2 by FDA under an Emergency Use Authorization (EUA). This EUA will remain  in effect (meaning this test can be used) for the duration of the COVID-19 declaration under Section 564(b)(1) of the Act, 21 U.S.C.section 360bbb-3(b)(1), unless the authorization is terminated  or revoked sooner.       Influenza A by PCR NEGATIVE  NEGATIVE Final   Influenza B by PCR NEGATIVE NEGATIVE Final    Comment: (NOTE) The Xpert Xpress SARS-CoV-2/FLU/RSV plus assay is intended  as an aid °in the diagnosis of influenza from Nasopharyngeal swab specimens and °should not be used as a sole basis for treatment. Nasal washings and °aspirates are unacceptable for Xpert Xpress SARS-CoV-2/FLU/RSV °testing. ° °Fact Sheet for Patients: °https://www.fda.gov/media/152166/download ° °Fact Sheet for Healthcare Providers: °https://www.fda.gov/media/152162/download ° °This test is not yet approved or cleared by the [place] FDA and °has been authorized for detection and/or diagnosis of SARS-CoV-2 by °FDA under an Emergency Use Authorization (EUA). This EUA will remain °in effect (meaning this test can be used) for the duration of the °COVID-19 declaration under Section 564(b)(1) of the Act, 21 U.S.C. °section 360bbb-3(b)(1), unless the authorization is terminated or °revoked. ° °Performed at [facility], [address] [place], °[place] [address] °  °  ° °Labs: °BNP (last 3 results) °Recent Labs  °  05/11/21 °1628 05/12/21 °1928 05/18/21 °0143  °BNP 3,779.1* >4,500.0* >4,500.0*  ° °Basic Metabolic Panel: °Recent Labs  °Lab 11/07/21 °0459 11/08/21 °0822 11/09/21 °0539 11/11/21 °0439 11/12/21 °0429  °NA 136 137 135 137 137  °K 3.6 4.4 4.9 3.5 3.1*  °CL 95* 95* 96* 99 97*  °CO2 29 29 24 28 32  °GLUCOSE 150* 159* 240* 108* 121*  °BUN 27* 44* 62* 48* 25*  °CREATININE 2.73* 3.71* 4.77* 4.23* 2.84*  °CALCIUM 8.8* 9.1 9.1 8.9 8.3*  ° °Liver Function Tests: °Recent Labs  °Lab 11/06/21 °1008  °AST 15  °ALT 9  °ALKPHOS 80  °BILITOT 0.2*  °PROT 7.1  °ALBUMIN 2.2*  ° °No results for input(s): LIPASE, AMYLASE in the last 168 hours. °No results for input(s): AMMONIA in the last 168 hours. °CBC: °Recent Labs  °Lab 11/06/21 °1008 11/07/21 °0459 11/08/21 °0822 11/09/21 °0539 11/10/21 °0431 11/11/21 °0439 11/12/21 °0429  °WBC 19.8*   < > 16.6* 21.5* 11.7* 12.2* 11.9*   °NEUTROABS 18.4*  --   --   --   --   --   --   °HGB 7.5*   < > 8.2* 8.6* 7.3* 8.2* 7.8*  °HCT 24.8*   < > 26.3* 27.2* 23.3* 25.6* 25.0*  °MCV 91.9   < > 88.9 88.9 88.3 88.0 88.3  °PLT 205   < > 223 206 157 137* 162  ° < > = values in this interval not displayed.  ° °Cardiac Enzymes: °No results for input(s): CKTOTAL, CKMB, CKMBINDEX, TROPONINI in the last 168 hours. °BNP: °Invalid input(s): POCBNP °CBG: °Recent Labs  °Lab 11/10/21 °1929 11/10/21 °2131 11/11/21 °0012 11/11/21 °2258 11/12/21 °0813  °GLUCAP 177* 146* 120* 189* 97  ° °D-Dimer °No results for input(s): DDIMER in the last 72 hours. °Hgb A1c °No results for input(s): HGBA1C in the last 72 hours. °Lipid Profile °No results for input(s): CHOL, HDL, LDLCALC, TRIG, CHOLHDL, LDLDIRECT in the last 72 hours. °Thyroid function studies °No results for input(s): TSH, T4TOTAL, T3FREE, THYROIDAB in the last 72 hours. ° °Invalid input(s): FREET3 °Anemia work up °No results for input(s): VITAMINB12, FOLATE, FERRITIN, TIBC, IRON, RETICCTPCT in the last 72 hours. °Urinalysis °   °Component Value Date/Time  ° COLORURINE YELLOW (A) 07/27/2021 0724  ° APPEARANCEUR HAZY (A) 07/27/2021 0724  ° LABSPEC 1.011 07/27/2021 0724  ° PHURINE 7.0 07/27/2021 0724  ° GLUCOSEU 150 (A) 07/27/2021 0724  ° HGBUR NEGATIVE 07/27/2021 0724  ° BILIRUBINUR NEGATIVE 07/27/2021 0724  ° KETONESUR NEGATIVE 07/27/2021 0724  ° PROTEINUR >=300 (A) 07/27/2021 0724  ° NITRITE NEGATIVE 07/27/2021 0724  ° LEUKOCYTESUR NEGATIVE 07/27/2021 0724  ° °Sepsis Labs °Invalid input(s): PROCALCITONIN,    WBC,  LACTICIDVEN °Microbiology °Recent Results (from the past 240 hour(s))  °Resp Panel by RT-PCR (Flu A&B, Covid) Nasopharyngeal Swab     Status: None  ° Collection Time: 11/06/21 11:14 AM  ° Specimen: Nasopharyngeal Swab; Nasopharyngeal(NP) swabs in vial transport medium  °Result Value Ref Range Status  ° SARS Coronavirus 2 by RT PCR NEGATIVE NEGATIVE Final  °  Comment: (NOTE) °SARS-CoV-2 target nucleic acids are  NOT DETECTED. ° °The SARS-CoV-2 RNA is generally detectable in upper respiratory °specimens during the acute phase of infection. The lowest °concentration of SARS-CoV-2 viral copies this assay can detect is °138 copies/mL. A negative result does not preclude SARS-Cov-2 °infection and should not be used as the sole basis for treatment or °other patient management decisions. A negative result may occur with  °improper specimen collection/handling, submission of specimen other °than nasopharyngeal swab, presence of viral mutation(s) within the °areas targeted by this assay, and inadequate number of viral °copies(<138 copies/mL). A negative result must be combined with °clinical observations, patient history, and epidemiological °information. The expected result is Negative. ° °Fact Sheet for Patients:  °https://www.fda.gov/media/152166/download ° °Fact Sheet for Healthcare Providers:  °https://www.fda.gov/media/152162/download ° °This test is no t yet approved or cleared by the [place] FDA and  °has been authorized for detection and/or diagnosis of SARS-CoV-2 by °FDA under an Emergency Use Authorization (EUA). This EUA will remain  °in effect (meaning this test can be used) for the duration of the °COVID-19 declaration under Section 564(b)(1) of the Act, 21 °U.S.C.section 360bbb-3(b)(1), unless the authorization is terminated  °or revoked sooner.  ° ° °  ° Influenza A by PCR NEGATIVE NEGATIVE Final  ° Influenza B by PCR NEGATIVE NEGATIVE Final  °  Comment: (NOTE) °The Xpert Xpress SARS-CoV-2/FLU/RSV plus assay is intended as an aid °in the diagnosis of influenza from Nasopharyngeal swab specimens and °should not be used as a sole basis for treatment. Nasal washings and °aspirates are unacceptable for Xpert Xpress SARS-CoV-2/FLU/RSV °testing. ° °Fact Sheet for Patients: °https://www.fda.gov/media/152166/download ° °Fact Sheet for Healthcare Providers: °https://www.fda.gov/media/152162/download ° °This test is not  yet approved or cleared by the [place] FDA and °has been authorized for detection and/or diagnosis of SARS-CoV-2 by °FDA under an Emergency Use Authorization (EUA). This EUA will remain °in effect (meaning this test can be used) for the duration of the °COVID-19 declaration under Section 564(b)(1) of the Act, 21 U.S.C. °section 360bbb-3(b)(1), unless the authorization is terminated or °revoked. ° °Performed at [facility], [address] [place], °[place] [address] °  °Culture, blood (single)     Status: None (Preliminary result)  ° Collection Time: 11/06/21 12:21 PM  ° Specimen: BLOOD  °Result Value Ref Range Status  ° Specimen Description BLOOD BLOOD RIGHT ARM  Final  ° Special Requests   Final  °  BOTTLES DRAWN AEROBIC AND ANAEROBIC Blood Culture results may not be optimal due to an excessive volume of blood received in culture bottles  ° Culture   Final  °  NO GROWTH 4 DAYS °Performed at  Hospital Lab, [address] [place], [place] [address] °  ° Report Status PENDING  Incomplete  °MRSA Next Gen by PCR, Nasal     Status: None  ° Collection Time: 11/07/21  5:08 AM  ° Specimen: Nasal Mucosa; Nasal Swab  °Result Value Ref Range Status  ° MRSA by PCR Next Gen NOT DETECTED NOT DETECTED Final  °  Comment: (NOTE) °The GeneXpert MRSA Assay (FDA approved for NASAL   specimens only), °is one component of a comprehensive MRSA colonization surveillance °program. It is not intended to diagnose MRSA infection nor to guide °or monitor treatment for MRSA infections. °Test performance is not FDA approved in patients less than 2 years °old. °Performed at [facility], [address] [place], °[place] [address] °  °Aerobic/Anaerobic Culture w Gram Stain (surgical/deep wound)     Status: None  ° Collection Time: 11/07/21 11:30 AM  ° Specimen: Buttocks; Abscess  °Result Value Ref Range Status  ° Specimen Description   Final  °  BUTTOCKS °Performed at [facility], [address] [place], [place] [address] °  ° Special Requests   Final  °  Normal °Performed at [facility], [address] , [place] [address] °  ° Gram Stain   Final  °  RARE WBC PRESENT,BOTH PMN AND MONONUCLEAR °FEW GRAM POSITIVE RODS °FEW GRAM POSITIVE COCCI °FEW GRAM NEGATIVE RODS °  ° Culture   Final  °  FEW CORYNEBACTERIUM STRIATUM °Standardized susceptibility testing for this organism is not available. °FEW BACTEROIDES OVATUS °BETA LACTAMASE POSITIVE °Performed at [facility], [address] [place], [place] [address] °  ° Report Status 11/11/2021 FINAL  Final  °Resp Panel by RT-PCR (Flu A&B, Covid) Nasopharyngeal Swab     Status: None  ° Collection Time: 11/11/21  6:30 PM  ° Specimen: Nasopharyngeal Swab; Nasopharyngeal(NP) swabs in vial transport medium  °Result Value Ref Range Status  ° SARS Coronavirus 2 by RT PCR NEGATIVE NEGATIVE Final  °  Comment: (NOTE) °SARS-CoV-2 target nucleic acids are NOT DETECTED. ° °The SARS-CoV-2 RNA is generally detectable in upper respiratory °specimens during the acute phase of infection. The lowest °concentration of SARS-CoV-2 viral copies this assay can detect is °138 copies/mL. A negative result does not preclude SARS-Cov-2 °infection and should not be used as the sole basis for treatment or °other patient management decisions. A negative result may occur with  °improper specimen collection/handling, submission of specimen other °than nasopharyngeal swab, presence of viral mutation(s) within the °areas targeted by this assay, and inadequate number of viral °copies(<138 copies/mL). A negative result must be combined with °clinical observations, patient history, and epidemiological °information. The expected result is Negative. ° °Fact Sheet for Patients:  °https://www.fda.gov/media/152166/download ° °Fact Sheet for Healthcare Providers:  °https://www.fda.gov/media/152162/download ° °This test is no t yet approved or cleared by the [place] FDA and  °has  been authorized for detection and/or diagnosis of SARS-CoV-2 by °FDA under an Emergency Use Authorization (EUA). This EUA will remain  °in effect (meaning this test can be used) for the duration of the °COVID-19 declaration under Section 564(b)(1) of the Act, 21 °U.S.C.section 360bbb-3(b)(1), unless the authorization is terminated  °or revoked sooner.  ° ° °  ° Influenza A by PCR NEGATIVE NEGATIVE Final  ° Influenza B by PCR NEGATIVE NEGATIVE Final  °  Comment: (NOTE) °The Xpert Xpress SARS-CoV-2/FLU/RSV plus assay is intended as an aid °in the diagnosis of influenza from Nasopharyngeal swab specimens and °should not be used as a sole basis for treatment. Nasal washings and °aspirates are unacceptable for Xpert Xpress SARS-CoV-2/FLU/RSV °testing. ° °Fact Sheet for Patients: °https://www.fda.gov/media/152166/download ° °Fact Sheet for Healthcare Providers: °https://www.fda.gov/media/152162/download ° °This test is not yet approved or cleared by the [place] FDA and °has been authorized for detection and/or diagnosis of SARS-CoV-2 by °FDA under an Emergency Use Authorization (EUA). This EUA will remain °in effect (meaning this test can be   used) for the duration of the °COVID-19 declaration under Section 564(b)(1) of the Act, 21 U.S.C. °section 360bbb-3(b)(1), unless the authorization is terminated or °revoked. ° °Performed at [facility], [address] [place], °[place] [address] °  ° ° ° °Time coordinating discharge: Over 30 minutes ° °SIGNED: ° ° ° M , MD  °[facility] °11/12/2021, 11:35 AM °Pager  ° °If 7PM-7AM, please contact night-coverage ° °

## 2021-11-12 NOTE — TOC Transition Note (Signed)
Transition of Care [facility]) - CM/SW Discharge Note   Patient Details  Name: [name] MRN: [id number] Date of Birth: [birth year]  [facility]) CM/SW Contact:  Beverly Sessions, RN Phone Number: 11/12/2021, 12:01 PM   Clinical Narrative:     Patient will DC to:[facility] Anticipated DC date: 11/12/21  Family notified:MD notified wife Transport by:EMS  Per MD patient ready for DC to . RN, patient, patient's family, and facility notified of DC. Discharge Summary sent to facility. RN given number for report. DC packet on chart. Ambulance transport requested for patient.  TOC signing off.  Isaias Cowman [facility] [phone]      Barriers to Discharge: Continued Medical Work up   Patient Goals and CMS Choice        Discharge Placement                       Discharge Plan and Services                                     Social Determinants of Health (SDOH) Interventions     Readmission Risk Interventions Readmission Risk Prevention Plan 11/09/2021 07/28/2021 02/10/2021  Transportation Screening Complete Complete Complete  PCP or Specialist Appt within 5-7 Days - - -  Home Care Screening - - -  Medication Review (RN CM) - - -  [place] or [place] - - Complete  Palliative Care Screening - - Complete  Medication Review ([place]) Complete Complete Complete  PCP or Specialist appointment within 3-5 days of discharge - Complete -  [place] or [place] (No Data) (No Data) -  Palliative Care Screening Not Applicable Not Applicable -  [place] Complete Not Applicable -  Some recent data might be hidden

## 2021-11-13 ENCOUNTER — Encounter: Payer: Self-pay | Admitting: Internal Medicine

## 2021-11-14 LAB — CULTURE, BLOOD (SINGLE): Culture: NO GROWTH

## 2021-12-29 ENCOUNTER — Ambulatory Visit (INDEPENDENT_AMBULATORY_CARE_PROVIDER_SITE_OTHER): Payer: Medicare Other

## 2021-12-29 VITALS — Wt 128.0 lb

## 2021-12-29 DIAGNOSIS — Z0000 Encounter for general adult medical examination without abnormal findings: Secondary | ICD-10-CM | POA: Diagnosis not present

## 2021-12-29 NOTE — Patient Instructions (Addendum)
Mr. Firestine , ?Thank you for taking time to come for your Medicare Wellness Visit. I appreciate your ongoing commitment to your health goals. Please review the following plan we discussed and let me know if I can assist you in the future.  ? ?Screening recommendations/referrals: ?Colonoscopy: 11/21/20 ?Recommended yearly ophthalmology/optometry visit for glaucoma screening and checkup ?Recommended yearly dental visit for hygiene and checkup ? ?Vaccinations: ?Influenza vaccine: 08/14/21 ?Pneumococcal vaccine: 10/01/11 ?Tdap vaccine: 01/31/18 ?Shingles vaccine: n/d   ?Covid-19: 12/24/19, 01/21/20, 11/17/20 ? ?Advanced directives: no ? ?Conditions/risks identified: none ? ?Next appointment: Follow up in one year for your annual wellness visit - 01/03/23 @ 2:20pm by phone ? ?Preventive Care 40-64 Years, Male ?Preventive care refers to lifestyle choices and visits with your health care provider that can promote health and wellness. ?What does preventive care include? ?A yearly physical exam. This is also called an annual well check. ?Dental exams once or twice a year. ?Routine eye exams. Ask your health care provider how often you should have your eyes checked. ?Personal lifestyle choices, including: ?Daily care of your teeth and gums. ?Regular physical activity. ?Eating a healthy diet. ?Avoiding tobacco and drug use. ?Limiting alcohol use. ?Practicing safe sex. ?Taking low-dose aspirin every day starting at age 50. ?What happens during an annual well check? ?The services and screenings done by your health care provider during your annual well check will depend on your age, overall health, lifestyle risk factors, and family history of disease. ?Counseling  ?Your health care provider may ask you questions about your: ?Alcohol use. ?Tobacco use. ?Drug use. ?Emotional well-being. ?Home and relationship well-being. ?Sexual activity. ?Eating habits. ?Work and work Statistician. ?Screening  ?You may have the following tests or  measurements: ?Height, weight, and BMI. ?Blood pressure. ?Lipid and cholesterol levels. These may be checked every 5 years, or more frequently if you are over 83 years old. ?Skin check. ?Lung cancer screening. You may have this screening every year starting at age 52 if you have a 30-pack-year history of smoking and currently smoke or have quit within the past 15 years. ?Fecal occult blood test (FOBT) of the stool. You may have this test every year starting at age 3. ?Flexible sigmoidoscopy or colonoscopy. You may have a sigmoidoscopy every 5 years or a colonoscopy every 10 years starting at age 61. ?Prostate cancer screening. Recommendations will vary depending on your family history and other risks. ?Hepatitis C blood test. ?Hepatitis B blood test. ?Sexually transmitted disease (STD) testing. ?Diabetes screening. This is done by checking your blood sugar (glucose) after you have not eaten for a while (fasting). You may have this done every 1-3 years. ?Discuss your test results, treatment options, and if necessary, the need for more tests with your health care provider. ?Vaccines  ?Your health care provider may recommend certain vaccines, such as: ?Influenza vaccine. This is recommended every year. ?Tetanus, diphtheria, and acellular pertussis (Tdap, Td) vaccine. You may need a Td booster every 10 years. ?Zoster vaccine. You may need this after age 85. ?Pneumococcal 13-valent conjugate (PCV13) vaccine. You may need this if you have certain conditions and have not been vaccinated. ?Pneumococcal polysaccharide (PPSV23) vaccine. You may need one or two doses if you smoke cigarettes or if you have certain conditions. ?Talk to your health care provider about which screenings and vaccines you need and how often you need them. ?This information is not intended to replace advice given to you by your health care provider. Make sure you discuss any questions you  have with your health care provider. ?Document Released:  10/31/2015 Document Revised: 06/23/2016 Document Reviewed: 08/05/2015 ?Elsevier Interactive Patient Education ? 2017 [place]. ? ?Fall Prevention in the Home ?Falls can cause injuries. They can happen to people of all ages. There are many things you can do to make your home safe and to help prevent falls. ?What can I do on the outside of my home? ?Regularly fix the edges of walkways and driveways and fix any cracks. ?Remove anything that might make you trip as you walk through a door, such as a raised step or threshold. ?Trim any bushes or trees on the path to your home. ?Use bright outdoor lighting. ?Clear any walking paths of anything that might make someone trip, such as rocks or tools. ?Regularly check to see if handrails are loose or broken. Make sure that both sides of any steps have handrails. ?Any raised decks and porches should have guardrails on the edges. ?Have any leaves, snow, or ice cleared regularly. ?Use sand or salt on walking paths during winter. ?Clean up any spills in your garage right away. This includes oil or grease spills. ?What can I do in the bathroom? ?Use night lights. ?Install grab bars by the toilet and in the tub and shower. Do not use towel bars as grab bars. ?Use non-skid mats or decals in the tub or shower. ?If you need to sit down in the shower, use a plastic, non-slip stool. ?Keep the floor dry. Clean up any water that spills on the floor as soon as it happens. ?Remove soap buildup in the tub or shower regularly. ?Attach bath mats securely with double-sided non-slip rug tape. ?Do not have throw rugs and other things on the floor that can make you trip. ?What can I do in the bedroom? ?Use night lights. ?Make sure that you have a light by your bed that is easy to reach. ?Do not use any sheets or blankets that are too big for your bed. They should not hang down onto the floor. ?Have a firm chair that has side arms. You can use this for support while you get dressed. ?Do not have  throw rugs and other things on the floor that can make you trip. ?What can I do in the kitchen? ?Clean up any spills right away. ?Avoid walking on wet floors. ?Keep items that you use a lot in easy-to-reach places. ?If you need to reach something above you, use a strong step stool that has a grab bar. ?Keep electrical cords out of the way. ?Do not use floor polish or wax that makes floors slippery. If you must use wax, use non-skid floor wax. ?Do not have throw rugs and other things on the floor that can make you trip. ?What can I do with my stairs? ?Do not leave any items on the stairs. ?Make sure that there are handrails on both sides of the stairs and use them. Fix handrails that are broken or loose. Make sure that handrails are as long as the stairways. ?Check any carpeting to make sure that it is firmly attached to the stairs. Fix any carpet that is loose or worn. ?Avoid having throw rugs at the top or bottom of the stairs. If you do have throw rugs, attach them to the floor with carpet tape. ?Make sure that you have a light switch at the top of the stairs and the bottom of the stairs. If you do not have them, ask someone to add them for you. ?  What else can I do to help prevent falls? ?Wear shoes that: ?Do not have high heels. ?Have rubber bottoms. ?Are comfortable and fit you well. ?Are closed at the toe. Do not wear sandals. ?If you use a stepladder: ?Make sure that it is fully opened. Do not climb a closed stepladder. ?Make sure that both sides of the stepladder are locked into place. ?Ask someone to hold it for you, if possible. ?Clearly mark and make sure that you can see: ?Any grab bars or handrails. ?First and last steps. ?Where the edge of each step is. ?Use tools that help you move around (mobility aids) if they are needed. These include: ?Canes. ?Walkers. ?Scooters. ?Crutches. ?Turn on the lights when you go into a dark area. Replace any light bulbs as soon as they burn out. ?Set up your furniture so  you have a clear path. Avoid moving your furniture around. ?If any of your floors are uneven, fix them. ?If there are any pets around you, be aware of where they are. ?Review your medicines with your doctor. Some me

## 2021-12-29 NOTE — Progress Notes (Signed)
?Virtual Visit via Telephone Note ? ?I connected with  [name] on 12/29/21 at  3:00 PM EDT by telephone and verified that I am speaking with the correct person using two identifiers. ?Spoke w/ his wife. ? ?Location: ?Patient: facility ?Provider: [facility] ?Persons participating in the virtual visit: patient/Nurse Health Advisor ?  ?I discussed the limitations, risks, security and privacy concerns of performing an evaluation and management service by telephone and the availability of in person appointments. The patient expressed understanding and agreed to proceed. ? ?Interactive audio and video telecommunications were attempted between this nurse and patient, however failed, due to patient having technical difficulties OR patient did not have access to video capability.  We continued and completed visit with audio only. ? ?Some vital signs may be absent or patient reported.  ? ?Dionisio David, LPN ? ?Subjective:  ? [name] is a 61 y.o. male who presents for Medicare Annual/Subsequent preventive examination. ? ?Review of Systems    ? ?  ? ?   ?Objective:  ?  ?Today's Vitals  ? 12/29/21 1458  ?Weight: 128 lb (58.1 kg)  ? ?Body mass index is 17.85 kg/m?. ? ?Advanced Directives 11/06/2021 07/27/2021 06/10/2021 06/09/2021 05/18/2021 05/13/2021 05/12/2021  ?Does Patient Have a Medical Advance Directive? No Unable to assess, patient is non-responsive or altered mental status No No No - No  ?Does patient want to make changes to medical advance directive? - - - - - - -  ?Would patient like information on creating a medical advance directive? No - Patient declined - No - Patient declined - No - Patient declined No - Patient declined -  ? ? ?Current Medications (verified) ?Outpatient Encounter Medications as of 12/29/2021  ?Medication Sig  ? Adalimumab 40 MG/0.4ML PNKT Inject 80 mg (2pens) subcutaneously weekly  ? amoxicillin-clavulanate (AUGMENTIN) 500-125 MG tablet Take by mouth.  ? acetaminophen (TYLENOL) 500 MG tablet  Take 1,000 mg by mouth 2 (two) times daily as needed for moderate pain, headache, mild pain or fever.  ? Alcohol Swabs PADS Use as directed to check blood sugar three times daily for insulin dependent type 2 diabetes.  ? amLODipine (NORVASC) 10 MG tablet Take 1 tablet (10 mg total) by mouth every evening.  ? atorvastatin (LIPITOR) 80 MG tablet TAKE 1 TABLET(80 MG) BY MOUTH DAILY  ? Blood Glucose Monitoring Suppl (ONE TOUCH ULTRA 2) w/Device KIT Use as directed to check blood sugar three times daily. E11.9  ? carvedilol (COREG) 25 MG tablet Take 1 tablet (25 mg total) by mouth 2 (two) times daily.  ? cloNIDine (CATAPRES) 0.1 MG tablet Take 0.1 mg by mouth 2 (two) times daily.  ? cloNIDine (CATAPRES) 0.2 MG tablet Take 0.5 tablets (0.1 mg total) by mouth 2 (two) times daily.  ? feeding supplement (ENSURE ENLIVE / ENSURE PLUS) LIQD Take 237 mLs by mouth 3 (three) times daily between meals. (Patient not taking: Reported on 09/24/2021)  ? gabapentin (NEURONTIN) 100 MG capsule Take 1 capsule (100 mg total) by mouth at bedtime.  ? [name] PEN 40 MG/0.8ML PNKT Inject 40 mg into the skin once a week.  ? hydrALAZINE (APRESOLINE) 100 MG tablet Take 1 tablet (100 mg total) by mouth every 8 (eight) hours.  ? Insulin Aspart FlexPen (NOVOLOG) 100 UNIT/ML Inject 2-12 Units into the skin 4 (four) times daily -  before meals and at bedtime. Per sliding scale ?200-249= 2 units ?250-299= 4 units ?300-349= 6 units ?350-399= 8 units ?400-449= 10 units  ?450-499=  12 units ?500 or greater= Call MD  ? insulin glargine-yfgn (SEMGLEE) 100 UNIT/ML injection Inject 0.05 mLs (5 Units total) into the skin daily.  ? isosorbide mononitrate (IMDUR) 30 MG 24 hr tablet Take 30 mg by mouth daily.  ? levETIRAcetam (KEPPRA) 750 MG tablet Take 750 mg by mouth daily at 12 noon.  ? lisinopril (ZESTRIL) 40 MG tablet Take 40 mg by mouth daily.  ? Multiple Vitamin (THEREMS PO) Take 1 tablet by mouth daily. multivitamin  ? oxymetazoline (AFRIN) 0.05 % nasal spray  Place 1 spray into both nostrils every 2 (two) hours as needed (breakthrough bleeding).  ? sevelamer carbonate (RENVELA) 800 MG tablet TAKE 1 TABLET(800 MG) BY MOUTH THREE TIMES DAILY (Patient not taking: Reported on 11/06/2021)  ? traMADol (ULTRAM) 50 MG tablet Take 50 mg by mouth 2 (two) times daily as needed.  ? ?No facility-administered encounter medications on file as of 12/29/2021.  ? ? ?Allergies (verified) ?Methotrexate, Vancomycin, Cefepime, and Tape  ? ?History: ?Past Medical History:  ?Diagnosis Date  ? Acute metabolic encephalopathy 55/73/2202  ? Anemia   ? Cardiomyopathy ([place])   ? a. 08/2020 Echo: EF 40-45%, glbo HK, GrII DD; b. 04/2021 Echo: EF 30-35%, glob HK, mild LVH, GrII DD.  ? Coronary artery calcification seen on CT scan   ? Crohn disease ([place])   ? Diabetes mellitus without complication ([place])   ? DVT of lower extremity (deep venous thrombosis) ([place]) 2016  ? Empyema ([place]) 05/20/2017  ? Encephalopathy 12/04/2017  ? ESRD (end stage renal disease) on hemodyalisis ([place])   ? Fall at home, initial encounter 09/12/2020  ? HFrEF (heart failure with reduced ejection fraction) ([place])   ? a. 08/2020 Echo: EF 40-45%, glbo HK, mod conc LVH, GrII DD, Ao root 80m; b. 04/2021 Echo: EF 30-35%, glob HK, mild LVH, GrII DD, mod red RV fxn, nl PASP, sev dil LA, mild-mod MR, mod TR, mild-mod AoV sclerosis w/o stenosis.  ? Hidradenitis suppurativa   ? Hypertension   ? I[place] ([place]rebral hemorrhage) ([place]   ? Peritonitis ([place] 04/21/2017  ? Py[place] 02/04/2016  ? Sepsis ([place] 01/12/2018  ? Stroke ([facility]   ? ?Past Surgical History:  ?Procedure Laterality Date  ? A/V FISTULAGRAM Left 02/24/2021  ? Procedure: A/V FISTULAGRAM;  Surgeon: SKatha Cabal MD;  [place];  Service: Cardiovascular;  Laterality: Left;  ? ABDOMINAL SURGERY    ? AMPUTATION FINGER Left 06/2019  ? PR AMPUTATION LONG FINGER/THUMB+FLAPS [facility]  ? [facility]   [facility]  ? BELOW KNEE  LEG AMPUTATION Right 08/2017  ? [facility]  ? COLONOSCOPY    ? COLONOSCOPY WITH PROPOFOL N/A 10/28/2020  ? Procedure: COLONOSCOPY WITH PROPOFOL;  Surgeon: VLin Landsman MD;  [facility];  Service: Gastroenterology;  Laterality: N/A;  ? COLONOSCOPY WITH PROPOFOL N/A 11/21/2020  ? Procedure: COLONOSCOPY WITH PROPOFOL;  Surgeon: WLucilla Lame MD;  [facility];  Service: Endoscopy;  Laterality: N/A;  ? DIALYSIS/PERMA CATHETER INSERTION N/A 12/09/2017  ? Procedure: DIALYSIS/PERMA CATHETER INSERTION;  Surgeon: SKatha Cabal MD;  [place];  Service: Cardiovascular;  Laterality: N/A;  ? DIALYSIS/PERMA CATHETER INSERTION N/A 12/12/2017  ? Procedure: DIALYSIS/PERMA CATHETER INSERTION;  Surgeon: DAlgernon Huxley MD;  [place];  Service: Cardiovascular;  Laterality: N/A;  ? DIALYSIS/PERMA CATHETER REMOVAL Left 12/09/2017  ? Procedure: DIALYSIS/PERMA CATHETER REMOVAL;  Surgeon: SKatha Cabal MD;  [facility];  Service: Cardiovascular;  Laterality: Left;  ? ESOPHAGOGASTRODUODENOSCOPY (EGD) WITH PROPOFOL N/A 11/20/2020  ? Procedure: ESOPHAGOGASTRODUODENOSCOPY (EGD) WITH PROPOFOL;  Surgeon: [name], MD;  Location: [facility] ENDOSCOPY;  Service: Endoscopy;  Laterality: N/A;  ? KNEE SURGERY Left 02/04/2016  ? [facility]  ? LEG AMPUTATION THROUGH LOWER TIBIA AND FIBULA Left 06/22/2018  ? [facility]  ? LOWER EXTREMITY ANGIOGRAPHY Right 08/08/2017  ? Procedure: Lower Extremity Angiography;  Surgeon: Algernon Huxley, MD;  Location: [place] CV LAB;  Service: Cardiovascular;  Laterality: Right;  ? LOWER EXTREMITY ANGIOGRAPHY Right 08/22/2017  ? Procedure: Lower Extremity Angiography;  Surgeon: Algernon Huxley, MD;  Location: [place] CV LAB;  Service: Cardiovascular;  Laterality: Right;  ? LOWER EXTREMITY INTERVENTION  08/08/2017  ? Procedure: LOWER EXTREMITY INTERVENTION;  Surgeon: Algernon Huxley, MD;  Location: [place] CV LAB;  Service: Cardiovascular;;  ?  LOWER EXTREMITY INTERVENTION  08/22/2017  ? Procedure: LOWER EXTREMITY INTERVENTION;  Surgeon: Algernon Huxley, MD;  Location: [place] CV LAB;  Service: Cardiovascular;;  ? ?Family History  ?Problem

## 2021-12-31 ENCOUNTER — Ambulatory Visit (INDEPENDENT_AMBULATORY_CARE_PROVIDER_SITE_OTHER): Payer: Medicare Other | Admitting: Vascular Surgery

## 2021-12-31 ENCOUNTER — Encounter (INDEPENDENT_AMBULATORY_CARE_PROVIDER_SITE_OTHER): Payer: Medicare Other

## 2022-01-14 ENCOUNTER — Ambulatory Visit: Payer: Medicare Other | Admitting: Nurse Practitioner

## 2022-01-14 NOTE — Progress Notes (Deleted)
? ? ?Office Visit  ?  ?Patient Name: [name] ?Date of Encounter: 01/14/2022 ? ?Primary Care Provider:  [name], Donald E, MD ?Primary Cardiologist:   End, MD ? ?Chief Complaint  ?  ?61-year-old male with a history of HFrEF, end-stage renal disease on hemodialysis, stroke/intracranial hemorrhage, seizure disorder, transfusion dependent anemia of chronic disease, DVT, peripheral arterial disease status post bilateral BKA's, type 2 diabetes mellitus, possible vascular dementia, hypertension, hyperlipidemia, coronary calcifications on CT, and hydradenitis suppurativa, who presents for follow-up related to heart failure and dilated cardiomyopathy. ? ?Past Medical History  ?  ?Past Medical History:  ?Diagnosis Date  ? Acute metabolic encephalopathy 11/18/2020  ? Anemia   ? Cardiomyopathy (HCC)   ? a. 08/2020 Echo: EF 40-45%, glbo HK, GrII DD; b. 04/2021 Echo: EF 30-35%, glob HK, mild LVH, GrII DD.  ? Coronary artery calcification seen on CT scan   ? Crohn disease (HCC)   ? Diabetes mellitus without complication (HCC)   ? DVT of lower extremity (deep venous thrombosis) (H[facility]) 2016  ? Empyema (HCC) 05/20/2017  ? Encephalopathy 12/04/2017  ? ESRD (end stage renal disease) on hemodyalisis (H[facility])   ? Fall at home, initial encounter 09/12/2020  ? HFrEF (heart failure with reduced ejection fraction) (HCC)   ? a. 08/2020 Echo: EF 40-45%, glbo HK, mod conc LVH, GrII DD, Ao root 40mm; b. 04/2021 Echo: EF 30-35%, glob HK, mild LVH, GrII DD, mod red RV fxn, nl PASP, sev dil LA, mild-mod MR, mod TR, mild-mod AoV sclerosis w/o stenosis.  ? Hidradenitis suppurativa   ? Hypertension   ? ICH (intracerebral hemorrhage) (HCC)   ? Peritonitis (HCC) 04/21/2017  ? Pyogenic arthritis of knee (HCC) 02/04/2016  ? Sepsis (HCC) 01/12/2018  ? Stroke (HCC)   ? ?Past Surgical History:  ?Procedure Laterality Date  ? A/V FISTULAGRAM Left 02/24/2021  ? Procedure: A/V FISTULAGRAM;  Surgeon: Schnier, Gregory G, MD;  Location: [facility] INVASIVE CV  LAB;  Service: Cardiovascular;  Laterality: Left;  ? ABDOMINAL SURGERY    ? AMPUTATION FINGER Left 06/2019  ? PR AMPUTATION LONG FINGER/THUMB+FLAPS [facility]  ? ANGIOPLASTY Left   ? [facility]  ? BELOW KNEE LEG AMPUTATION Right 08/2017  ? [facility]  ? COLONOSCOPY    ? COLONOSCOPY WITH PROPOFOL N/A 10/28/2020  ? Procedure: COLONOSCOPY WITH PROPOFOL;  Surgeon: Vanga, Rohini Reddy, MD;  Location: [facility] ENDOSCOPY;  Service: Gastroenterology;  Laterality: N/A;  ? COLONOSCOPY WITH PROPOFOL N/A 11/21/2020  ? Procedure: COLONOSCOPY WITH PROPOFOL;  Surgeon: Wohl, Darren, MD;  Location: [facility] ENDOSCOPY;  Service: Endoscopy;  Laterality: N/A;  ? DIALYSIS/PERMA CATHETER INSERTION N/A 12/09/2017  ? Procedure: DIALYSIS/PERMA CATHETER INSERTION;  Surgeon: Schnier, Gregory G, MD;  Location: [facility] INVASIVE CV LAB;  Service: Cardiovascular;  Laterality: N/A;  ? DIALYSIS/PERMA CATHETER INSERTION N/A 12/12/2017  ? Procedure: DIALYSIS/PERMA CATHETER INSERTION;  Surgeon: Dew, Jason S, MD;  Location: [facility] INVASIVE CV LAB;  Service: Cardiovascular;  Laterality: N/A;  ? DIALYSIS/PERMA CATHETER REMOVAL Left 12/09/2017  ? Procedure: DIALYSIS/PERMA CATHETER REMOVAL;  Surgeon: Schnier, Gregory G, MD;  Location: [facility] INVASIVE CV LAB;  Service: Cardiovascular;  Laterality: Left;  ? ESOPHAGOGASTRODUODENOSCOPY (EGD) WITH PROPOFOL N/A 11/20/2020  ? Procedure: ESOPHAGOGASTRODUODENOSCOPY (EGD) WITH PROPOFOL;  Surgeon: Wohl, Darren, MD;  Location: [facility] ENDOSCOPY;  Service: Endoscopy;  Laterality: N/A;  ? KNEE SURGERY Left 02/04/2016  ? [facility]  ? LEG AMPUTATION THROUGH LOWER TIBIA AND FIBULA Left 06/22/2018  ? [facility]  ? LOWER EXTREMITY ANGIOGRAPHY Right 08/08/2017  ?   Procedure: Lower Extremity Angiography;  Surgeon: Dew, Jason S, MD;  Location: [facility] INVASIVE CV LAB;  Service: Cardiovascular;  Laterality: Right;  ? LOWER EXTREMITY ANGIOGRAPHY Right 08/22/2017  ? Procedure: Lower Extremity Angiography;  Surgeon: Dew, Jason S, MD;  Location: [facility] INVASIVE CV LAB;   Service: Cardiovascular;  Laterality: Right;  ? LOWER EXTREMITY INTERVENTION  08/08/2017  ? Procedure: LOWER EXTREMITY INTERVENTION;  Surgeon: Dew, Jason S, MD;  Location: [facility] INVASIVE CV LAB;  Service: Cardiovascular;;  ? LOWER EXTREMITY INTERVENTION  08/22/2017  ? Procedure: LOWER EXTREMITY INTERVENTION;  Surgeon: Dew, Jason S, MD;  Location: [facility] INVASIVE CV LAB;  Service: Cardiovascular;;  ? ? ?Allergies ? ?Allergies  ?Allergen Reactions  ? Methotrexate Other (See Comments)  ?  Blood count drops  ? Vancomycin Shortness Of Breath  ?  Eyes watering, SOB, wheezing  ? Cefepime Other (See Comments)  ?  Confusion   ? Tape   ? ? ?History of Present Illness  ?  ?61-year-old male with above complex past medical history including HFrEF, end-stage renal disease on hemodialysis, stroke/intracranial hemorrhage, seizure disorder, transfusion dependent anemia of chronic disease, DVT, peripheral arterial disease status post bilateral BKA's, type 2 diabetes mellitus, question of vascular dementia, hypertension, hyperlipidemia, coronary calcifications on CT, and hydradenitis suppurativa.  Prior echo November 2021 showed an EF of 40 to 45% with global hypokinesis and grade 2 diastolic dysfunction.  In July 2022, he was admitted to [facility] with weakness and falls.  He was volume overloaded and required hemodialysis.  Echo showed an EF of 30 to 35% with global hypokinesis, mild LVH, grade 2 diastolic dysfunction, and moderately reduced RV systolic function.  He was readmitted in August 2022 with recurrent hypoxic respiratory failure requiring BiPAP secondary to pneumonia, pleural effusion, and worsening anemia with a hemoglobin of 5.8 requiring transfusion.  He required left-sided thoracentesis during admission.  CT of the chest was negative for PE but showed coronary calcifications.  We saw him during that admission related to LV dysfunction noted on prior echo and given multiple comorbidities, deferred ischemic work-up.   Patient and family later expressed preference for conservative management.  He required repeat admission in October 2022 secondary to altered mental status, aspiration pneumonia, and epistaxis with hemoglobin of 6.2 requiring a unit of blood.  He was most recently admitted in late January 2023 in the setting of altered mental status, hypoglycemia (glucose 39), poor p.o. intake sepsis with draining hydradenitis suppurativa, pneumonia with hypoxic respiratory failure, and anemia requiring transfusion of 1 unit.  He was discharged back to skilled nursing facility on January 26. ? ?Since most recent hospitalization, ? ?Home Medications  ?  ?Current Outpatient Medications  ?Medication Sig Dispense Refill  ? acetaminophen (TYLENOL) 500 MG tablet Take 1,000 mg by mouth 2 (two) times daily as needed for moderate pain, headache, mild pain or fever.    ? Adalimumab 40 MG/0.4ML PNKT Inject 80 mg (2pens) subcutaneously weekly (Patient not taking: Reported on 12/29/2021)    ? Alcohol Swabs PADS Use as directed to check blood sugar three times daily for insulin dependent type 2 diabetes. 100 each 12  ? amLODipine (NORVASC) 10 MG tablet Take 1 tablet (10 mg total) by mouth every evening.    ? amoxicillin-clavulanate (AUGMENTIN) 500-125 MG tablet Take by mouth. (Patient not taking: Reported on 12/29/2021)    ? atorvastatin (LIPITOR) 80 MG tablet TAKE 1 TABLET(80 MG) BY MOUTH DAILY 90 tablet 3  ? Blood Glucose Monitoring Suppl (ONE TOUCH ULTRA 2)   w/Device KIT Use as directed to check blood sugar three times daily. E11.9 1 each 0  ? carvedilol (COREG) 25 MG tablet Take 1 tablet (25 mg total) by mouth 2 (two) times daily. 60 tablet 12  ? cloNIDine (CATAPRES) 0.1 MG tablet Take 0.1 mg by mouth 2 (two) times daily.    ? cloNIDine (CATAPRES) 0.2 MG tablet Take 0.5 tablets (0.1 mg total) by mouth 2 (two) times daily.    ? feeding supplement (ENSURE ENLIVE / ENSURE PLUS) LIQD Take 237 mLs by mouth 3 (three) times daily between meals. 237 mL  12  ? gabapentin (NEURONTIN) 100 MG capsule Take 1 capsule (100 mg total) by mouth at bedtime.    ? [name] PEN 40 MG/0.8ML PNKT Inject 40 mg into the skin once a week.    ? hydrALAZINE (APRESOLINE) 100

## 2022-01-25 ENCOUNTER — Inpatient Hospital Stay
Admission: EM | Admit: 2022-01-25 | Discharge: 2022-02-15 | DRG: 208 | Disposition: E | Payer: Medicare Other | Source: Other Acute Inpatient Hospital | Attending: Internal Medicine | Admitting: Internal Medicine

## 2022-01-25 ENCOUNTER — Other Ambulatory Visit: Payer: Self-pay

## 2022-01-25 ENCOUNTER — Emergency Department: Payer: Medicare Other

## 2022-01-25 DIAGNOSIS — Z20822 Contact with and (suspected) exposure to covid-19: Secondary | ICD-10-CM | POA: Diagnosis present

## 2022-01-25 DIAGNOSIS — I2510 Atherosclerotic heart disease of native coronary artery without angina pectoris: Secondary | ICD-10-CM | POA: Diagnosis present

## 2022-01-25 DIAGNOSIS — E1122 Type 2 diabetes mellitus with diabetic chronic kidney disease: Secondary | ICD-10-CM | POA: Diagnosis present

## 2022-01-25 DIAGNOSIS — E8809 Other disorders of plasma-protein metabolism, not elsewhere classified: Secondary | ICD-10-CM | POA: Diagnosis present

## 2022-01-25 DIAGNOSIS — J690 Pneumonitis due to inhalation of food and vomit: Secondary | ICD-10-CM | POA: Diagnosis present

## 2022-01-25 DIAGNOSIS — G3184 Mild cognitive impairment, so stated: Secondary | ICD-10-CM | POA: Diagnosis present

## 2022-01-25 DIAGNOSIS — J9602 Acute respiratory failure with hypercapnia: Secondary | ICD-10-CM | POA: Diagnosis present

## 2022-01-25 DIAGNOSIS — Z992 Dependence on renal dialysis: Secondary | ICD-10-CM | POA: Diagnosis not present

## 2022-01-25 DIAGNOSIS — J9809 Other diseases of bronchus, not elsewhere classified: Secondary | ICD-10-CM | POA: Diagnosis present

## 2022-01-25 DIAGNOSIS — Z751 Person awaiting admission to adequate facility elsewhere: Secondary | ICD-10-CM

## 2022-01-25 DIAGNOSIS — R54 Age-related physical debility: Secondary | ICD-10-CM | POA: Diagnosis present

## 2022-01-25 DIAGNOSIS — D631 Anemia in chronic kidney disease: Secondary | ICD-10-CM | POA: Diagnosis present

## 2022-01-25 DIAGNOSIS — Z7189 Other specified counseling: Secondary | ICD-10-CM | POA: Diagnosis not present

## 2022-01-25 DIAGNOSIS — R64 Cachexia: Secondary | ICD-10-CM | POA: Diagnosis present

## 2022-01-25 DIAGNOSIS — I5043 Acute on chronic combined systolic (congestive) and diastolic (congestive) heart failure: Secondary | ICD-10-CM | POA: Diagnosis present

## 2022-01-25 DIAGNOSIS — E7800 Pure hypercholesterolemia, unspecified: Secondary | ICD-10-CM | POA: Diagnosis present

## 2022-01-25 DIAGNOSIS — N400 Enlarged prostate without lower urinary tract symptoms: Secondary | ICD-10-CM | POA: Diagnosis present

## 2022-01-25 DIAGNOSIS — N186 End stage renal disease: Secondary | ICD-10-CM | POA: Diagnosis present

## 2022-01-25 DIAGNOSIS — T17500A Unspecified foreign body in bronchus causing asphyxiation, initial encounter: Secondary | ICD-10-CM | POA: Diagnosis present

## 2022-01-25 DIAGNOSIS — Z8673 Personal history of transient ischemic attack (TIA), and cerebral infarction without residual deficits: Secondary | ICD-10-CM

## 2022-01-25 DIAGNOSIS — K50919 Crohn's disease, unspecified, with unspecified complications: Secondary | ICD-10-CM | POA: Diagnosis not present

## 2022-01-25 DIAGNOSIS — J984 Other disorders of lung: Secondary | ICD-10-CM | POA: Diagnosis present

## 2022-01-25 DIAGNOSIS — I132 Hypertensive heart and chronic kidney disease with heart failure and with stage 5 chronic kidney disease, or end stage renal disease: Secondary | ICD-10-CM | POA: Diagnosis present

## 2022-01-25 DIAGNOSIS — E11649 Type 2 diabetes mellitus with hypoglycemia without coma: Secondary | ICD-10-CM | POA: Diagnosis not present

## 2022-01-25 DIAGNOSIS — R7989 Other specified abnormal findings of blood chemistry: Secondary | ICD-10-CM | POA: Diagnosis present

## 2022-01-25 DIAGNOSIS — Z1621 Resistance to vancomycin: Secondary | ICD-10-CM | POA: Diagnosis present

## 2022-01-25 DIAGNOSIS — Z66 Do not resuscitate: Secondary | ICD-10-CM | POA: Diagnosis not present

## 2022-01-25 DIAGNOSIS — L732 Hidradenitis suppurativa: Secondary | ICD-10-CM | POA: Diagnosis present

## 2022-01-25 DIAGNOSIS — Z833 Family history of diabetes mellitus: Secondary | ICD-10-CM

## 2022-01-25 DIAGNOSIS — R4189 Other symptoms and signs involving cognitive functions and awareness: Secondary | ICD-10-CM

## 2022-01-25 DIAGNOSIS — J9811 Atelectasis: Secondary | ICD-10-CM | POA: Diagnosis present

## 2022-01-25 DIAGNOSIS — Z515 Encounter for palliative care: Secondary | ICD-10-CM | POA: Diagnosis not present

## 2022-01-25 DIAGNOSIS — G40909 Epilepsy, unspecified, not intractable, without status epilepticus: Secondary | ICD-10-CM | POA: Diagnosis present

## 2022-01-25 DIAGNOSIS — K5090 Crohn's disease, unspecified, without complications: Secondary | ICD-10-CM | POA: Diagnosis present

## 2022-01-25 DIAGNOSIS — N2581 Secondary hyperparathyroidism of renal origin: Secondary | ICD-10-CM | POA: Diagnosis present

## 2022-01-25 DIAGNOSIS — E43 Unspecified severe protein-calorie malnutrition: Secondary | ICD-10-CM | POA: Diagnosis present

## 2022-01-25 DIAGNOSIS — I5022 Chronic systolic (congestive) heart failure: Secondary | ICD-10-CM

## 2022-01-25 DIAGNOSIS — Z8261 Family history of arthritis: Secondary | ICD-10-CM

## 2022-01-25 DIAGNOSIS — Z8249 Family history of ischemic heart disease and other diseases of the circulatory system: Secondary | ICD-10-CM

## 2022-01-25 DIAGNOSIS — E876 Hypokalemia: Secondary | ICD-10-CM | POA: Diagnosis present

## 2022-01-25 DIAGNOSIS — I10 Essential (primary) hypertension: Secondary | ICD-10-CM

## 2022-01-25 DIAGNOSIS — G9341 Metabolic encephalopathy: Secondary | ICD-10-CM | POA: Diagnosis present

## 2022-01-25 DIAGNOSIS — E1165 Type 2 diabetes mellitus with hyperglycemia: Secondary | ICD-10-CM | POA: Diagnosis present

## 2022-01-25 DIAGNOSIS — R778 Other specified abnormalities of plasma proteins: Secondary | ICD-10-CM | POA: Diagnosis not present

## 2022-01-25 DIAGNOSIS — J9601 Acute respiratory failure with hypoxia: Secondary | ICD-10-CM | POA: Diagnosis present

## 2022-01-25 DIAGNOSIS — Z86718 Personal history of other venous thrombosis and embolism: Secondary | ICD-10-CM

## 2022-01-25 DIAGNOSIS — E785 Hyperlipidemia, unspecified: Secondary | ICD-10-CM | POA: Diagnosis not present

## 2022-01-25 DIAGNOSIS — F32A Depression, unspecified: Secondary | ICD-10-CM | POA: Diagnosis present

## 2022-01-25 DIAGNOSIS — Z89512 Acquired absence of left leg below knee: Secondary | ICD-10-CM

## 2022-01-25 DIAGNOSIS — Z87891 Personal history of nicotine dependence: Secondary | ICD-10-CM

## 2022-01-25 DIAGNOSIS — B952 Enterococcus as the cause of diseases classified elsewhere: Secondary | ICD-10-CM | POA: Diagnosis present

## 2022-01-25 DIAGNOSIS — Z89511 Acquired absence of right leg below knee: Secondary | ICD-10-CM

## 2022-01-25 DIAGNOSIS — E1151 Type 2 diabetes mellitus with diabetic peripheral angiopathy without gangrene: Secondary | ICD-10-CM | POA: Diagnosis present

## 2022-01-25 DIAGNOSIS — I429 Cardiomyopathy, unspecified: Secondary | ICD-10-CM | POA: Diagnosis present

## 2022-01-25 DIAGNOSIS — Z681 Body mass index (BMI) 19 or less, adult: Secondary | ICD-10-CM

## 2022-01-25 DIAGNOSIS — F320 Major depressive disorder, single episode, mild: Secondary | ICD-10-CM | POA: Diagnosis not present

## 2022-01-25 LAB — BRAIN NATRIURETIC PEPTIDE: B Natriuretic Peptide: >4500 pg/mL — ABNORMAL HIGH (ref 0.0–100.0)

## 2022-01-25 LAB — BLOOD GAS, VENOUS
Acid-Base Excess: 6.2 mmol/L — ABNORMAL HIGH (ref 0.0–2.0)
Bicarbonate: 35.7 mmol/L — ABNORMAL HIGH (ref 20.0–28.0)
FIO2: 100 %
O2 Saturation: 69.5 %
Patient temperature: 37
pCO2, Ven: 76 mmHg (ref 44–60)
pH, Ven: 7.28 (ref 7.25–7.43)
pO2, Ven: 44 mmHg (ref 32–45)

## 2022-01-25 LAB — COMPREHENSIVE METABOLIC PANEL
ALT: 8 U/L (ref 0–44)
AST: 16 U/L (ref 15–41)
Albumin: 2.1 g/dL — ABNORMAL LOW (ref 3.5–5.0)
Alkaline Phosphatase: 87 U/L (ref 38–126)
Anion gap: 10 (ref 5–15)
BUN: 30 mg/dL — ABNORMAL HIGH (ref 6–20)
CO2: 31 mmol/L (ref 22–32)
Calcium: 8.3 mg/dL — ABNORMAL LOW (ref 8.9–10.3)
Chloride: 96 mmol/L — ABNORMAL LOW (ref 98–111)
Creatinine, Ser: 4.13 mg/dL — ABNORMAL HIGH (ref 0.61–1.24)
GFR, Estimated: 16 mL/min — ABNORMAL LOW (ref >60)
Glucose, Bld: 158 mg/dL — ABNORMAL HIGH (ref 70–99)
Potassium: 3.4 mmol/L — ABNORMAL LOW (ref 3.5–5.1)
Sodium: 137 mmol/L (ref 135–145)
Total Bilirubin: 0.7 mg/dL (ref 0.3–1.2)
Total Protein: 7.8 g/dL (ref 6.5–8.1)

## 2022-01-25 LAB — GLUCOSE, CAPILLARY
Glucose-Capillary: 181 mg/dL — ABNORMAL HIGH (ref 70–99)
Glucose-Capillary: 174 mg/dL — ABNORMAL HIGH (ref 70–99)

## 2022-01-25 LAB — TROPONIN I (HIGH SENSITIVITY)
Troponin I (High Sensitivity): 19 ng/L — ABNORMAL HIGH (ref <18)
Troponin I (High Sensitivity): 18 ng/L — ABNORMAL HIGH (ref <18)

## 2022-01-25 LAB — CBC WITH DIFFERENTIAL/PLATELET
Abs Immature Granulocytes: 0.36 10*3/uL — ABNORMAL HIGH (ref 0.00–0.07)
Basophils Absolute: 0 10*3/uL (ref 0.0–0.1)
Basophils Relative: 0 %
Eosinophils Absolute: 0.2 10*3/uL (ref 0.0–0.5)
Eosinophils Relative: 1 %
HCT: 37.9 % — ABNORMAL LOW (ref 39.0–52.0)
Hemoglobin: 10.6 g/dL — ABNORMAL LOW (ref 13.0–17.0)
Immature Granulocytes: 3 %
Lymphocytes Relative: 11 %
Lymphs Abs: 1.3 10*3/uL (ref 0.7–4.0)
MCH: 25.2 pg — ABNORMAL LOW (ref 26.0–34.0)
MCHC: 28 g/dL — ABNORMAL LOW (ref 30.0–36.0)
MCV: 90.2 fL (ref 80.0–100.0)
Monocytes Absolute: 0.7 10*3/uL (ref 0.1–1.0)
Monocytes Relative: 6 %
Neutro Abs: 10 10*3/uL — ABNORMAL HIGH (ref 1.7–7.7)
Neutrophils Relative %: 79 %
Platelets: 202 10*3/uL (ref 150–400)
RBC: 4.2 MIL/uL — ABNORMAL LOW (ref 4.22–5.81)
RDW: 22.1 % — ABNORMAL HIGH (ref 11.5–15.5)
Smear Review: NORMAL
WBC: 12.6 10*3/uL — ABNORMAL HIGH (ref 4.0–10.5)
nRBC: 0 % (ref 0.0–0.2)

## 2022-01-25 LAB — RESP PANEL BY RT-PCR (FLU A&B, COVID) ARPGX2
Influenza A by PCR: NEGATIVE
Influenza B by PCR: NEGATIVE
SARS Coronavirus 2 by RT PCR: NEGATIVE

## 2022-01-25 LAB — PROCALCITONIN: Procalcitonin: 0.33 ng/mL

## 2022-01-25 LAB — MRSA NEXT GEN BY PCR, NASAL: MRSA by PCR Next Gen: NOT DETECTED

## 2022-01-25 LAB — LACTIC ACID, PLASMA
Lactic Acid, Venous: 0.9 mmol/L (ref 0.5–1.9)
Lactic Acid, Venous: 1.2 mmol/L (ref 0.5–1.9)

## 2022-01-25 MED ORDER — ALBUTEROL SULFATE (2.5 MG/3ML) 0.083% IN NEBU
2.5000 mg | INHALATION_SOLUTION | RESPIRATORY_TRACT | Status: DC | PRN
  Administered 2022-02-02: 2.5 mg via RESPIRATORY_TRACT
  Filled 2022-01-25: qty 3

## 2022-01-25 MED ORDER — CHLORHEXIDINE GLUCONATE CLOTH 2 % EX PADS
6.0000 | MEDICATED_PAD | Freq: Every day | CUTANEOUS | Status: DC
  Administered 2022-01-29 – 2022-02-02 (×4): 6 via TOPICAL

## 2022-01-25 MED ORDER — HYDROMORPHONE HCL 1 MG/ML IJ SOLN
0.5000 mg | Freq: Once | INTRAMUSCULAR | Status: AC
  Administered 2022-01-25: 0.5 mg via INTRAVENOUS
  Filled 2022-01-25: qty 1

## 2022-01-25 MED ORDER — HYDRALAZINE HCL 50 MG PO TABS: 100.0000 mg | ORAL_TABLET | Freq: Three times a day (TID) | ORAL | Status: DC

## 2022-01-25 MED ORDER — SEVELAMER CARBONATE 800 MG PO TABS: 800.0000 mg | ORAL_TABLET | ORAL | Status: DC

## 2022-01-25 MED ORDER — HEPARIN SODIUM (PORCINE) 5000 UNIT/ML IJ SOLN
5000.0000 [IU] | Freq: Three times a day (TID) | INTRAMUSCULAR | Status: DC
  Administered 2022-01-25 – 2022-01-31 (×15): 5000 [IU] via SUBCUTANEOUS
  Filled 2022-01-25 (×15): qty 1

## 2022-01-25 MED ORDER — ISOSORBIDE MONONITRATE ER 30 MG PO TB24
30.0000 mg | ORAL_TABLET | Freq: Every day | ORAL | Status: DC
  Administered 2022-01-26: 30 mg via ORAL
  Filled 2022-01-25: qty 1

## 2022-01-25 MED ORDER — SEVELAMER CARBONATE 800 MG PO TABS: 800.0000 mg | ORAL_TABLET | Freq: Two times a day (BID) | ORAL | Status: DC

## 2022-01-25 MED ORDER — LISINOPRIL 20 MG PO TABS
40.0000 mg | ORAL_TABLET | Freq: Every day | ORAL | Status: DC
  Administered 2022-01-26: 40 mg via ORAL
  Filled 2022-01-25: qty 2

## 2022-01-25 MED ORDER — INSULIN ASPART 100 UNIT/ML IJ SOLN: 0.0000 [IU] | Freq: Every day | INTRAMUSCULAR | Status: DC

## 2022-01-25 MED ORDER — LEVETIRACETAM 750 MG PO TABS
750.0000 mg | ORAL_TABLET | Freq: Every day | ORAL | Status: DC
  Filled 2022-01-25 (×2): qty 1

## 2022-01-25 MED ORDER — CARVEDILOL 25 MG PO TABS
25.0000 mg | ORAL_TABLET | Freq: Two times a day (BID) | ORAL | Status: DC
  Administered 2022-01-26: 25 mg via ORAL
  Filled 2022-01-25: qty 1

## 2022-01-25 MED ORDER — ENSURE ENLIVE PO LIQD
237.0000 mL | Freq: Three times a day (TID) | ORAL | Status: DC
  Administered 2022-01-26: 237 mL via ORAL

## 2022-01-25 MED ORDER — GABAPENTIN 100 MG PO CAPS
100.0000 mg | ORAL_CAPSULE | Freq: Every day | ORAL | Status: DC
  Administered 2022-01-26: 100 mg via ORAL
  Filled 2022-01-25: qty 1

## 2022-01-25 MED ORDER — SODIUM CHLORIDE 0.9 % IV SOLN: 500.0000 mg | Freq: Once | INTRAVENOUS | Status: DC

## 2022-01-25 MED ORDER — CHLORHEXIDINE GLUCONATE CLOTH 2 % EX PADS
6.0000 | MEDICATED_PAD | Freq: Every day | CUTANEOUS | Status: DC
  Administered 2022-01-25 – 2022-02-02 (×7): 6 via TOPICAL

## 2022-01-25 MED ORDER — ONDANSETRON HCL 4 MG/2ML IJ SOLN
4.0000 mg | Freq: Three times a day (TID) | INTRAMUSCULAR | Status: DC | PRN
  Administered 2022-01-28 – 2022-01-29 (×2): 4 mg via INTRAVENOUS
  Filled 2022-01-25 (×2): qty 2

## 2022-01-25 MED ORDER — ACETAMINOPHEN 325 MG PO TABS
650.0000 mg | ORAL_TABLET | Freq: Four times a day (QID) | ORAL | Status: DC | PRN
  Administered 2022-01-26: 650 mg via ORAL
  Filled 2022-01-25: qty 2

## 2022-01-25 MED ORDER — LEVOFLOXACIN IN D5W 750 MG/150ML IV SOLN: 750.0000 mg | Freq: Once | INTRAVENOUS | Status: DC

## 2022-01-25 MED ORDER — CLONIDINE HCL 0.1 MG PO TABS
0.1000 mg | ORAL_TABLET | Freq: Two times a day (BID) | ORAL | Status: DC
  Administered 2022-01-26: 0.1 mg via ORAL
  Filled 2022-01-25: qty 1

## 2022-01-25 MED ORDER — ONDANSETRON HCL 4 MG/2ML IJ SOLN
4.0000 mg | Freq: Once | INTRAMUSCULAR | Status: AC
Start: 2022-01-25 — End: 2022-01-25
  Administered 2022-01-25: 4 mg via INTRAVENOUS
  Filled 2022-01-25: qty 2

## 2022-01-25 MED ORDER — HYDRALAZINE HCL 20 MG/ML IJ SOLN: 5.0000 mg | INTRAMUSCULAR | Status: DC | PRN

## 2022-01-25 MED ORDER — ATORVASTATIN CALCIUM 20 MG PO TABS: 80.0000 mg | ORAL_TABLET | Freq: Every day | ORAL | Status: DC

## 2022-01-25 MED ORDER — LORAZEPAM 2 MG/ML IJ SOLN
1.0000 mg | INTRAMUSCULAR | Status: DC | PRN
  Administered 2022-01-28: 1 mg via INTRAVENOUS
  Filled 2022-01-25: qty 1

## 2022-01-25 MED ORDER — TRAMADOL HCL 50 MG PO TABS
50.0000 mg | ORAL_TABLET | Freq: Four times a day (QID) | ORAL | Status: DC | PRN
  Administered 2022-01-26: 50 mg via ORAL
  Filled 2022-01-25: qty 1

## 2022-01-25 MED ORDER — INSULIN ASPART 100 UNIT/ML IJ SOLN: 0.0000 [IU] | Freq: Three times a day (TID) | INTRAMUSCULAR | Status: DC

## 2022-01-25 MED ORDER — IPRATROPIUM-ALBUTEROL 0.5-2.5 (3) MG/3ML IN SOLN
3.0000 mL | RESPIRATORY_TRACT | Status: DC
Start: 2022-01-25 — End: 2022-01-28
  Administered 2022-01-25 – 2022-01-28 (×20): 3 mL via RESPIRATORY_TRACT
  Filled 2022-01-25 (×19): qty 3

## 2022-01-25 MED ORDER — SODIUM CHLORIDE 0.9 % IV SOLN: 2.0000 g | Freq: Once | INTRAVENOUS | Status: DC

## 2022-01-25 MED ORDER — ADULT MULTIVITAMIN W/MINERALS CH: 1.0000 | ORAL_TABLET | Freq: Every day | ORAL | Status: DC

## 2022-01-25 MED ORDER — SODIUM CHLORIDE 0.9 % IV SOLN
1.0000 g | INTRAVENOUS | Status: DC
  Administered 2022-01-25: 1 g via INTRAVENOUS
  Filled 2022-01-25: qty 10
  Filled 2022-01-25: qty 1

## 2022-01-25 MED ORDER — HYDROCODONE-ACETAMINOPHEN 10-325 MG PO TABS: 1.0000 | ORAL_TABLET | Freq: Every day | ORAL | Status: DC

## 2022-01-25 MED ORDER — AMLODIPINE BESYLATE 10 MG PO TABS
10.0000 mg | ORAL_TABLET | Freq: Every evening | ORAL | Status: DC
  Filled 2022-01-25: qty 1

## 2022-01-25 MED ORDER — SODIUM CHLORIDE 0.9 % IV SOLN
500.0000 mg | INTRAVENOUS | Status: DC
  Administered 2022-01-25: 500 mg via INTRAVENOUS
  Filled 2022-01-25: qty 5
  Filled 2022-01-25: qty 500

## 2022-01-25 MED ORDER — SEVELAMER CARBONATE 800 MG PO TABS: 800.0000 mg | ORAL_TABLET | Freq: Three times a day (TID) | ORAL | Status: DC

## 2022-01-25 MED ORDER — DM-GUAIFENESIN ER 30-600 MG PO TB12
1.0000 | ORAL_TABLET | Freq: Two times a day (BID) | ORAL | Status: DC | PRN
  Administered 2022-01-28: 1 via ORAL
  Filled 2022-01-25: qty 1

## 2022-01-25 NOTE — Consult Note (Addendum)
? ?NAME:  [name], MRN:  [id number], DOB:  1961/03/11, LOS: 0 ?ADMISSION DATE:  01/28/2022, CONSULTATION DATE:  02/10/2022 ?REFERRING MD:  Dr. Blaine Brady, CHIEF COMPLAINT:  Unresponsiveness, Hypoxia  ? ?Brief Pt Description / Synopsis:  ?61 y.o. Male admitted with Acute Hypoxic and Hypercapnic Respiratory Failure in the setting of Volume Overload/Pulmonary Edema, Mucus Plugging, and possible Pneumonia requiring BiPAP. ? ?History of Present Illness:  ?[name] is a 61 year old male with a significant past medical history as listed below who presented to [facility] ED on 02/07/2022 from his outpatient hemodialysis center due to unresponsiveness and hypoxia. ? ?Patient is currently lethargic and BiPAP mask is in place, therefore history is obtained from patient's spouse [name] at bedside, along with chart review.  Reportedly patient went to dialysis for his regular scheduled dialysis today, but about 15 minutes into the session, he became unresponsive and was noted to be hypoxic with O2 saturations in the 70s.  He was noted to have multiple secretions that had to be suctioned.  EMS placed nonrebreather mask with improvement of O2 sats into the 80s.  Upon arrival to the ED he was placed on BiPAP.  His mental status improved on BiPAP and no focal neuro deficits were noted. ? ?ED Course: ?Triage vital signs: Temperature 98 Fahrenheit orally, respiratory rate 22, pulse 77, blood pressure 163/73, SPO2 79% on nonrebreather ?Significant labs: Potassium 3.4, chloride 96, glucose 158, BUN 30, creatinine 4.13, BNP greater than 4500, high-sensitivity troponin 19, procalcitonin 0.33, lactic acid 0.9, WBC 12.6 with neutrophilia, hemoglobin 10.6, hematocrit 37.9 ?VBG on nonrebreather mask: pH 7.28/PCO2 76/PO2 40/bicarbonate 35.7 ?COVID-19 and influenza PCR negative ?Imaging: ?Chest x-ray>>IMPRESSION: ?1. Complete opacification of the right lung with associated volume ?loss, presumably secondary to mucous plugging. ?2. Improved aeration  of the left lung with mild residual pulmonary ?venous congestion. ?Medications administered: Azithromycin and ceftriaxone ? ?He was admitted to the stepdown unit by the hospitalist for further work-up and treatment.   ? ?PCCM is asked for pulmonary consultation to further evaluate mucous plugging and need for possible bronchoscopy. ? ?Pertinent  Medical History  ?ESRD on Hemodialysis ?HFrEF (LVEF 30-35%) ?Hypertension ?Hyperlipidemia ?Peripheral Vascular Disease s/p bilateral BKA's ?Diabetes Mellitus ?Stroke ?ICH ?Seizure ?DVT ?Crohn's Disease ?Peritonitis ?BPH ?Anemia ?Hidradenitis Suppurativa on [name] ? ?Micro Data:  ?4/10: SARS-CoV-2 & Influenza PCR>>negative ?4/10: Blood culture>> ?4/10: Sputum>> ?4/10: Strep pneumo urinary antigen>> ?4/10: Legionella urinary antigen>> ? ?Antimicrobials:  ?Azithromycin 4/10>> ?Ceftriaxone 4/10>> ? ?Significant Hospital Events: ?Including procedures, antibiotic start and stop dates in addition to other pertinent events   ?4/10: Admitted to stepdown by the hospitalist.  PCCM asked for pulmonary consult due to mucous plugging and possible need for bronchoscopy ? ?Interim History / Subjective:  ?-Patient met about the hospitalist ?-Currently on BiPAP and tolerating, looks comfortable, able arouse to voice and follows simple commands ?-PCCM consulted due to right mucous plugging and possible need for bronchoscopy ?-Discussed with Dr. Rexene Brady, will implement conservative measures and aggressive pulmonary toilet for now and reevaluate with chest x-ray in the morning ? ?Objective   ?Blood pressure 134/64, pulse 83, temperature 97.7 ?F (36.5 ?C), temperature source Axillary, resp. rate 16, height 5' 11" (1.803 m), weight 75.3 kg, SpO2 95 %. ?   ?FiO2 (%):  [40 %-60 %] 40 %  ?No intake or output data in the 24 hours ending 01/28/2022 1716 ?Filed Weights  ? 01/19/2022 1338  ?Weight: 75.3 kg  ? ? ?Examination: ?General: Acute on chronically ill-appearing frail male, sitting in bed,  on BiPAP,  no acute distress ?HENT: Atraumatic, normocephalic, neck supple, no JVD ?Lungs: Coarse breath sounds throughout with right lower field diminished, BiPAP assisted, even, nonlabored ?Cardiovascular: Bradycardia, regular rhythm, S1-S2, no murmurs, rubs, gallop ?Abdomen: Soft, nontender, nondistended, no guarding rebound tenderness, bowel sounds positive x4 ?Extremities: Bilateral BKA's well-healed, no cyanosis or clubbing ?Neuro: Lethargic, arouses easily to voice, moves all extremities to command, no focal deficits, difficult to assess orientation due to BiPAP ?GU: Deferred ? ?Resolved Hospital Problem list   ? ? ?Assessment & Plan:  ? ?Acute Hypoxic and Hypercapnic Respiratory Failure in the setting of Volume Overload/Pulmonary Edema, Mucus Plugging, and suspected Pneumonia ?-Supplemental O2 as needed to maintain O2 sats >92% ?-BiPAP, wean as tolerated ?-Follow intermittent Chest X-ray & ABG as needed ?-Bronchodilators ?-ABX as above ?-Volume removal with Hemodialysis ?-Discussed with Dr. Lanney Brady ~ will implement Aggressive Pulmonary toilet as able (Incentive spirometry/flutter valve, chest PT) with follow up CXR in the morning to see if Bronchoscopy is needed ? ?Acute on Chronic HFrEF (LVEF 30-35%) ?Mildly elevated troponin, suspect demand ischemia ?Hypertension ?-Continuous cardiac monitoring ?-Maintain MAP >65 ?-BNP >4500 ?-Volume removal with Hemodialysis ?-HS Troponin peaked at 19 ?-Lactic acid is normal at 0.9 ?-Echocardiogram 05/13/21: LVEF 30-35%, Grade II diastolic dysfunction, moderately reduced RV systolic function, mild to moderate Mitral regurgitation, moderate tricuspid regurgitation ?-Resume home antihypertensives when able to tolerate p.o. ? ?Concern for possible Pneumonia ?Meet SIRS criteria upon admission (RR 22, WBC 12.6) ?-Monitor fever curve ?-Trend WBC's & Procalcitonin ?-Follow cultures as above ?-Continue empiric Azithromycin & Ceftriaxone pending cultures & sensitivities ? ?ESRD on  Hemodialysis ?Mild Hypokalemia ?-Monitor I&O's / urinary output ?-Follow BMP ?-Ensure adequate renal perfusion ?-Avoid nephrotoxic agents as able ?-Replace electrolytes as indicated ?-Nephrology consulted, appreciate input ~ HD as per Nephrology ? ?Acute Metabolic Encephalopathy, suspect due to Hypoxia ?PMHx: Stroke, Seizure disorder ?-Provide supportive care ?-Treatment of Hypoxia as outlined above ?-Avoid sedating meds ?-Continue home Keppra ? ?Anemia of Chronic Disease ?-Monitor for S/Sx of bleeding ?-Trend CBC ?-Heparin SQ for VTE Prophylaxis  ?-Transfuse for Hgb <7 ? ?Diabetes Mellitus ?-CBG's q4h; Target range of 140 to 180 ?-SSI ?-Follow ICU Hypo/Hyperglycemia protocol ? ? ? ? ? ?Best Practice (right click and "Reselect all SmartList Selections" daily)  ? ?Diet/type: NPO while on BiPAP ?DVT prophylaxis: prophylactic heparin  ?GI prophylaxis: N/A ?Lines: N/A ?Foley:  N/A ?Code Status:  full code ?Last date of multidisciplinary goals of care discussion [N/A] ? ?Updated pt's spouse [name] at bedside 4/10.  All questions answered to her satisfaction. ? ?Labs   ?CBC: ?Recent Labs  ?Lab 01/24/2022 ?1333  ?WBC 12.6*  ?NEUTROABS 10.0*  ?HGB 10.6*  ?HCT 37.9*  ?MCV 90.2  ?PLT 202  ? ? ?Basic Metabolic Panel: ?Recent Labs  ?Lab 02/10/2022 ?1333  ?NA 137  ?K 3.4*  ?CL 96*  ?CO2 31  ?GLUCOSE 158*  ?BUN 30*  ?CREATININE 4.13*  ?CALCIUM 8.3*  ? ?GFR: ?Estimated Creatinine Clearance: 20.5 mL/min (A) (by C-G formula based on SCr of 4.13 mg/dL (H)). ?Recent Labs  ?Lab 02/10/2022 ?1333 02/06/2022 ?1628  ?WBC 12.6*  --   ?LATICACIDVEN  --  0.9  ? ? ?Liver Function Tests: ?Recent Labs  ?Lab 01/27/2022 ?1333  ?AST 16  ?ALT 8  ?ALKPHOS 87  ?BILITOT 0.7  ?PROT 7.8  ?ALBUMIN 2.1*  ? ?No results for input(s): LIPASE, AMYLASE in the last 168 hours. ?No results for input(s): AMMONIA in the last 168 hours. ? ?ABG ?   ?Component Value Date/Time  ?  PHART 7.41 04/13/2017 1455  ? PCO2ART 36 04/13/2017 1455  ? PO2ART 53 (L) 04/13/2017 1455  ? HCO3  35.7 (H) 02/14/2022 1334  ? TCO2 25 09/12/2020 1119  ? ACIDBASEDEF 1.5 04/13/2017 1455  ? O2SAT 69.5 02/01/2022 1334  ?  ? ?Coagulation Profile: ?No results for input(s): INR, PROTIME in the last 168 hours. ? ?Ca

## 2022-01-25 NOTE — Consult Note (Signed)
WOC Nurse Consult Note: ?Reason for Consult:Wounds to buttocks and sacral area related to hidradenitis suppurativa.Patient is on [name]. Known to our department from previous admissions. The photograph taken by Dr. Blaine Hamper on admission is appreciated. ?Wound type: Autoimmune ?Pressure Injury POA: N/A ?Wound MMI:TVIF, moist ?Drainage (amount, consistency, odor) small amounts of seropurulent drainage ?Periwound: scarring, induration ?Dressing procedure/placement/frequency: ?I will provide Nursing with conservative guidance for the topical care of these lesions; if further care or input is sought I recommend consultation with Surgery.  ?I will suggest daily care using a soap and water cleanse of the affected areas followed by rinsing and patting dry. Topical care will consist of an antimicrobial absorbent dressing (Aquacel Ag+ Advantage, a silver hydrofiber) that can be changed daily. It is to be topped with dry gauze and secured with a silicone foam dressing for atraumatic changes.  ?Turning and repositioning is in place; time in the supine position is to be minimized.  ? ?[place] nursing team will not follow, but will remain available to this patient, the nursing and medical teams.  Please re-consult if needed. ?Thanks, ?Maudie Flakes, MSN, RN, [place], [place], CWON-AP, [place]  ?Pager# [phone]  ? ? ?  ?

## 2022-01-25 NOTE — Sepsis Progress Note (Signed)
Sepsis protocol monitored by eLink 

## 2022-01-25 NOTE — ED Notes (Signed)
Pt is from [place] ?

## 2022-01-25 NOTE — ED Triage Notes (Signed)
BIB EMS from dialysis. dialysis stopped after 15 mins of treatmemt pt became unresponsive and they called 911. EMS placed NRB at 15 liters and could not get above 90%. EMS had to suction airway 3 times.  ? ?160 CBG ?176/75 ? ?

## 2022-01-25 NOTE — Consult Note (Addendum)
CODE SEPSIS - PHARMACY COMMUNICATION ? ?**Broad Spectrum Antibiotics should be administered within 1 hour of Sepsis diagnosis** ? ?Time Code Sepsis Called/Page Received: 5369 ? ?Antibiotics Ordered: Azithro,Rocephin ? ?Time of 1st antibiotic administration: 2230 ? ?Additional action taken by pharmacy: none ? ?If necessary, Name of Provider/Nurse Contacted: n/a ? ? ? ?Pearla Dubonnet ,PharmD ?Clinical Pharmacist  ?02/05/2022  6:16 PM ? ?

## 2022-01-25 NOTE — Progress Notes (Addendum)
Patient admitted from ED with BiPAP on 60%. Patient quickly weaned to 35%. NPO. Bilateral  BKAs with prosthesis noted. Patient slightly confused. Thinks it is Monday,not sure of month. Wife staes he has had declining health for the past few months. Was admitted to [facility] August of 2022 after he had received his prothesis. Wife reports he has lost a lot of weight since then. Patient barely eats therefore wife refuses his insulin and finger sticks at SNF. Wife reports all his issues stem from being admitted to SNF ie heart problems,weakness etc. She states he has not walked since amputations and could only stand with prosthesis. Now resting quietly with BiPaP on. Wife and son in room. ?

## 2022-01-25 NOTE — H&P (Signed)
?History and Physical  ? ? ?[name] OMV:[id number] DOB: May 25, 1961 DOA: 02/01/2022 ? ?Referring MD/NP/PA:  ? ?PCP: [name], MD  ? ?Patient coming from:  The patient is coming from home.  At baseline, pt is independent for most of ADL.       ? ?Chief Complaint: Unresponsiveness ? ?HPI: [name] is a 61 y.o. male with medical history significant of ESRD-HD (MWF), hidradenitis suppurativa on [name], hypertension, hyperlipidemia, diabetes mellitus, stroke, peritonitis, ICH, DVT, s/p for bilateral BKA, Crohn's disease, anemia, seizure, PVD, mild cognitive impairment, sCHF with EF 30-35%, BPH, hidradenitis suppurativa on Cosentyx injection, presents with unresponsiveness. ? ?Per report, pt went to dialysis center for his regular dialysis today, but he became unresponsive at about 15 minutes after started dialysis. Pt did not have any injury, particularly no head injury.  Patient was in chair the whole time. No seizure activity when EMS got there. Pt was noted to have oxygen desaturation to the 70s.  Initially nonrebreather was started but saturation in the 80s, then BiPAP was started in ED. Per report, patient had multiple secretions that had to be removed. When I saw pt, he already woke up. He is alert and oriented x3.  Moves all extremities.  No facial droop or slurred speech. Patient has cough, denies any chest pain. No nausea, vomiting, diarrhea or abdominal pain.  No symptoms of UTI. ? ?Data Reviewed and ED Course: pt was found to have WBC 12.6, troponin level 19, BMP> 4500, negative COVID PCR, potassium 3.4, bicarbonate of 31, creatinine 4.13, BUN 30, temperature normal, blood pressure 158/69, heart rate 77, RR 31, chest x-ray showed complete right lung opacity due to possible mucus plug.  VBG with pH 7.28, CO2 76, O2 44. Patient is admitted to stepdown as inpatient.  Dr. Delmar Landau of ICU  and Dr. Holley Raring are consulted. ? ?EKG: I have personally reviewed.  Sinus rhythm, QTc 461, PVC,  nonspecific T wave change ? ? ?Review of Systems:  ? ?General: no fevers, chills, no body weight gain, fatigue ?HEENT: no blurry vision, hearing changes or sore throat ?Respiratory: has dyspnea, coughing, no wheezing ?CV: no chest pain, no palpitations ?GI: no nausea, vomiting, abdominal pain, diarrhea, constipation ?GU: no dysuria, burning on urination, increased urinary frequency, hematuria  ?Ext: no leg edema. S/p of bilateral BKA ?Neuro: no unilateral weakness, numbness, or tingling, no vision change or hearing loss. Has unresponsiveness. ?Skin: has hidradenitis suppurativa lesions in sacral area ?MSK: No muscle spasm, no deformity, no limitation of range of movement in spin ?Heme: No easy bruising.  ?Travel history: No recent long distant travel. ? ? ?Allergy:  ?Allergies  ?Allergen Reactions  ? Methotrexate Other (See Comments)  ?  Blood count drops  ? Vancomycin Shortness Of Breath  ?  Eyes watering, SOB, wheezing  ? Cefepime Other (See Comments)  ?  Confusion   ? Tape   ? ? ?Past Medical History:  ?Diagnosis Date  ? Acute metabolic encephalopathy 62/83/6629  ? Anemia   ? Cardiomyopathy ([place])   ? a. 08/2020 Echo: EF 40-45%, glbo HK, GrII DD; b. 04/2021 Echo: EF 30-35%, glob HK, mild LVH, GrII DD.  ? Coronary artery calcification seen on CT scan   ? Crohn disease ([place])   ? Diabetes mellitus without complication ([place])   ? DVT of lower extremity (deep venous thrombosis) ([place]) 2016  ? Empyema ([place]) 05/20/2017  ? Encephalopathy 12/04/2017  ? ESRD (end stage renal disease) on hemodyalisis ([place])   ?  Fall at home, initial encounter 09/12/2020  ? HFrEF (heart failure with reduced ejection fraction) ([place])   ? a. 08/2020 Echo: EF 40-45%, glbo HK, mod conc LVH, GrII DD, Ao root 5m; b. 04/2021 Echo: EF 30-35%, glob HK, mild LVH, GrII DD, mod red RV fxn, nl PASP, sev dil LA, mild-mod MR, mod TR, mild-mod AoV sclerosis w/o stenosis.  ? Hidradenitis suppurativa   ? Hypertension   ? ICH (intracerebral hemorrhage) ([place]   ?  [place]itonitis ([place] 04/21/2017  ? Pyogenic arthritis of [place] ([place] 02/04/2016  ? Sepsis ([place] 01/12/2018  ? Stroke ([facility]   ? ? ?Past Surgical History:  ?Procedure Laterality Date  ? A/V FISTULAGRAM Left 02/24/2021  ? Procedure: A/V FISTULAGRAM;  Surgeon: SKatha Cabal MD;  [facility];  Service: Cardiovascular;  Laterality: Left;  ? ABDOMINAL SURGERY    ? AMPUTATION FINGER Left 06/2019  ? PR AMPUTATION LONG FINGER/THUMB+FLAPS [facility]  ? [facility]   ? [facility]  ? BELOW KNEE LEG AMPUTATION Right 08/2017  ? [facility]  ? COLONOSCOPY    ? COLONOSCOPY WITH PROPOFOL N/A 10/28/2020  ? Procedure: COLONOSCOPY WITH PROPOFOL;  Surgeon: VLin Landsman MD;  [facility];  Service: Gastroenterology;  Laterality: N/A;  ? COLONOSCOPY WITH PROPOFOL N/A 11/21/2020  ? Procedure: COLONOSCOPY WITH PROPOFOL;  Surgeon: WLucilla Lame MD;  [facility];  Service: Endoscopy;  Laterality: N/A;  ? DIALYSIS/PERMA CATHETER INSERTION N/A 12/09/2017  ? Procedure: DIALYSIS/PERMA CATHETER INSERTION;  Surgeon: SKatha Cabal MD;  [place];  Service: Cardiovascular;  Laterality: N/A;  ? DIALYSIS/PERMA CATHETER INSERTION N/A 12/12/2017  ? Procedure: DIALYSIS/PERMA CATHETER INSERTION;  Surgeon: DAlgernon Huxley MD;  [place] [facility] [place];  Service: Cardiovascular;  Laterality: N/A;  ? DIALYSIS/PERMA CATHETER REMOVAL Left 12/09/2017  ? Procedure: DIALYSIS/PERMA CATHETER REMOVAL;  Surgeon: SKatha Cabal MD;  [place] [facility] [place];  Service: Cardiovascular;  Laterality: Left;  ? ESOPHAGOGASTRODUODENOSCOPY (EGD) WITH PROPOFOL N/A 11/20/2020  ? Procedure: ESOPHAGOGASTRODUODENOSCOPY (EGD) WITH PROPOFOL;  Surgeon: WLucilla Lame MD;  [facility];  Service: Endoscopy;  Laterality: N/A;  ? KNEE SURGERY Left 02/04/2016  ? [facility]  ? LEG AMPUTATION THROUGH LOWER TIBIA AND FIBULA Left 06/22/2018  ? [facility]  ? LOWER EXTREMITY ANGIOGRAPHY Right 08/08/2017   ? Procedure: Lower Extremity Angiography;  Surgeon: DAlgernon Huxley MD;  [place];  Service: Cardiovascular;  Laterality: Right;  ? LOWER EXTREMITY ANGIOGRAPHY Right 08/22/2017  ? Procedure: Lower Extremity Angiography;  Surgeon: DAlgernon Huxley MD;  [place] [facility];  Service: Cardiovascular;  Laterality: Right;  ? LOWER EXTREMITY INTERVENTION  08/08/2017  ? Procedure: LOWER EXTREMITY INTERVENTION;  Surgeon: DAlgernon Huxley MD;  [place] [facility];  Service: Cardiovascular;;  ? LOWER EXTREMITY INTERVENTION  08/22/2017  ? Procedure: LOWER EXTREMITY INTERVENTION;  Surgeon: DAlgernon Huxley MD;  [place] [facility];  Service: Cardiovascular;;  ? ? ?Social History:  reports that he has quit smoking. He has never used smokeless tobacco. He reports current drug use. Drug: Marijuana. He reports that he does not drink alcohol. ? ?Family History:  ?Family History  ?Problem Relation Age of Onset  ? Irritable bowel syndrome Sister   ? Diabetes Sister   ? Heart disease Mother   ? Diabetes Mother   ? Heart disease Father   ? Rheumatic fever Father   ?     as child  ?  Psoriasis Brother   ? Arthritis Brother   ? Diabetes Sister   ? Diabetes Sister   ?  ? ?Prior to Admission medications   ?Medication Sig Start Date End Date Taking? Authorizing Provider  ?amLODipine (NORVASC) 10 MG tablet Take 1 tablet (10 mg total) by mouth every evening. 08/02/21  Yes Wouk, Ailene Rud, MD  ?amoxicillin-clavulanate (AUGMENTIN) 500-125 MG tablet Take 1 tablet by mouth in the morning and at bedtime. 12/09/21  Yes [provider]  ?atorvastatin (LIPITOR) 80 MG tablet TAKE 1 TABLET(80 MG) BY MOUTH DAILY ?Patient taking differently: Take 80 mg by mouth at bedtime. 06/25/20  Yes [name], MD  ?carvedilol (COREG) 25 MG tablet Take 1 tablet (25 mg total) by mouth 2 (two) times daily. 06/27/20  Yes [name], MD  ?cloNIDine (CATAPRES) 0.1 MG tablet Take 0.1 mg by mouth 2 (two) times  daily. 11/05/21  Yes [provider]  ?COSENTYX, 300 MG DOSE, 150 MG/ML SOSY Inject 300 mg into the skin every 30 (thirty) days. Every 4 weeks 01/12/22  Yes [provider]  ?gabapentin (NEURONTIN) 1

## 2022-01-25 NOTE — ED Provider Notes (Signed)
? ?[facility] ?Provider Note ? ? ? Event Date/Time  ? First MD Initiated Contact with Patient 02/05/2022 1325   ?  (approximate) ? ? ?History  ? ?Respiratory Distress ? ? ?HPI ? ?[name] is a 61 y.o. male with ESRD who comes in with concern for hypoxia.  Patient was 15 minutes into treatment when he became unresponsive.  Did not hit his head was in the chair the whole time no seizure activity when EMS got there his sats were in the 70s and placed on a nonrebreather but sats were in the 80s.  Patient had multiple secretions that had to be removed.  On my arrival patient was satting in the 71s on a nonrebreather.  He reports shortness of breath started today.  No abdominal pain no falls no other issues. ? ? ?Physical Exam  ? ?Triage Vital Signs: ?ED Triage Vitals [01/27/2022 1338]  ?Enc Vitals Group  ?   BP (!) 163/73  ?   Pulse Rate 77  ?   Resp (!) 22  ?   Temp 98 ?F (36.7 ?C)  ?   Temp Source Oral  ?   SpO2 (!) 79 %  ?   Weight 166 lb 0.1 oz (75.3 kg)  ?   Height 5' 11"  (1.803 m)  ?   Head Circumference   ?   Peak Flow   ?   Pain Score 0  ?   Pain Loc   ?   Pain Edu?   ?   Excl. in [place]?   ? ? ?Most recent vital signs: ?Vitals:  ? 01/26/2022 1338  ?BP: (!) 163/73  ?Pulse: 77  ?Resp: (!) 22  ?Temp: 98 ?F (36.7 ?C)  ?SpO2: (!) 79%  ? ? ? ?General: Awake, no distress.  ?CV:  Good peripheral perfusion.  ?Resp:  Normal effort.  Coarse breath sounds satting in the 80s on nonrebreather ?Abd:  No distention.  Nontender ?Other:  Bilateral amputation ? ? ?ED Results / Procedures / Treatments  ? ?Labs ?(all labs ordered are listed, but only abnormal results are displayed) ?Labs Reviewed  ?CBC WITH DIFFERENTIAL/PLATELET - Abnormal; Notable for the following components:  ?    Result Value  ? WBC 12.6 (*)   ? RBC 4.20 (*)   ? Hemoglobin 10.6 (*)   ? HCT 37.9 (*)   ? MCH 25.2 (*)   ? MCHC 28.0 (*)   ? RDW 22.1 (*)   ? Neutro Abs 10.0 (*)   ? Abs Immature Granulocytes 0.36 (*)   ? All other components within  normal limits  ?COMPREHENSIVE METABOLIC PANEL - Abnormal; Notable for the following components:  ? Potassium 3.4 (*)   ? Chloride 96 (*)   ? Glucose, Bld 158 (*)   ? BUN 30 (*)   ? Creatinine, Ser 4.13 (*)   ? Calcium 8.3 (*)   ? Albumin 2.1 (*)   ? GFR, Estimated 16 (*)   ? All other components within normal limits  ?BLOOD GAS, VENOUS - Abnormal; Notable for the following components:  ? pCO2, Ven 76 (*)   ? Bicarbonate 35.7 (*)   ? Acid-Base Excess 6.2 (*)   ? All other components within normal limits  ?TROPONIN I (HIGH SENSITIVITY) - Abnormal; Notable for the following components:  ? Troponin I (High Sensitivity) 19 (*)   ? All other components within normal limits  ?RESP PANEL BY RT-PCR (FLU A&B, COVID) ARPGX2  ?BRAIN NATRIURETIC PEPTIDE  ? ? ? ?  EKG ? ?My interpretation of EKG: ? ?Normal sinus rate of 76 without any ST elevation and a little bit of ST depression in aVF otherwise normal intervals ? ?RADIOLOGY ?I have reviewed the xray personally patient has some white out of the right lung ? ? ?PROCEDURES: ? ?Critical Care performed: yes  ? ?.1-3 Lead EKG Interpretation ?Performed by: Vanessa [place], MD ?Authorized by: Vanessa [place], MD  ? ?  Interpretation: normal   ?  ECG rate:  70 ?  ECG rate assessment: normal   ?  Rhythm: sinus rhythm   ?  Ectopy: none   ?  Conduction: normal   ?.Critical Care ?Performed by: Vanessa Melville, MD ?Authorized by: Vanessa Singer, MD  ? ?Critical care provider statement:  ?  Critical care time (minutes):  30 ?  Critical care was necessary to treat or prevent imminent or life-threatening deterioration of the following conditions:  Respiratory failure ?  Critical care was time spent personally by me on the following activities:  Development of treatment plan with patient or surrogate, discussions with consultants, evaluation of patient's response to treatment, examination of patient, ordering and review of laboratory studies, ordering and review of radiographic studies, ordering and  performing treatments and interventions, pulse oximetry, re-evaluation of patient's condition and review of old charts ? ? ?MEDICATIONS ORDERED IN ED: ?Medications  ?ipratropium-albuterol (DUONEB) 0.5-2.5 (3) MG/3ML nebulizer solution 3 mL (has no administration in time range)  ?albuterol (PROVENTIL) (2.5 MG/3ML) 0.083% nebulizer solution 2.5 mg (has no administration in time range)  ?dextromethorphan-guaiFENesin (MUCINEX DM) 30-600 MG per 12 hr tablet 1 tablet (has no administration in time range)  ?cefTRIAXone (ROCEPHIN) 1 g in sodium chloride 0.9 % 100 mL IVPB (has no administration in time range)  ?azithromycin (ZITHROMAX) 500 mg in sodium chloride 0.9 % 250 mL IVPB (has no administration in time range)  ?LORazepam (ATIVAN) injection 1 mg (has no administration in time range)  ?ondansetron (ZOFRAN) injection 4 mg (has no administration in time range)  ?acetaminophen (TYLENOL) tablet 650 mg (has no administration in time range)  ?hydrALAZINE (APRESOLINE) injection 5 mg (has no administration in time range)  ?insulin aspart (novoLOG) injection 0-9 Units (has no administration in time range)  ?insulin aspart (novoLOG) injection 0-5 Units (has no administration in time range)  ?HYDROmorphone (DILAUDID) injection 0.5 mg (0.5 mg Intravenous Given 01/24/2022 1452)  ?ondansetron (ZOFRAN) injection 4 mg (4 mg Intravenous Given 02/11/2022 1451)  ? ? ? ?IMPRESSION / MDM / ASSESSMENT AND PLAN / ED COURSE  ?I reviewed the triage vital signs and the nursing notes. ?             ?               ? ? ?Patient with ESRD who comes in with syncopal episode with hypoxia.  Patient sounds very coarse upon examination I suspect that this could be fluid overload due to missing dialysis.  Patient was placed on BiPAP, chest x-ray ordered, cardiac markers to evaluate for ACS. ? ?VBG does show slightly elevated CO2 which patient is on BiPAP.  White count is slightly elevated at 12.6.  Hemoglobin is stable.  CMP shows known elevated creatinine but  potassium is normal at 3.4. trop slightly elevated but down from prior. ? ?X-ray concerning for whiteout right lung which I discussed the case with IR who thinks this more likely from mucous plugging then a pleural effusion. ? ?Patient's white count is elevated so we will get  blood cultures, lactate and start on some antibiotics due to allergies will start on levofloxacin.  Discussed the case with Dr. Raul Del from pulmonary to see if he could potentially need bronchoscopy.  He recommended conservative treatment with incentive spirometry, chest physiotherapy and a repeat chest x-ray in the morning to see if improving.  Patient looks comfortable currently on the BiPAP I did discuss the hospital team for admission ? ?The patient is on the cardiac monitor to evaluate for evidence of arrhythmia and/or significant heart rate changes. ?  ? ? ?FINAL CLINICAL IMPRESSION(S) / ED DIAGNOSES  ? ?Final diagnoses:  ?Acute respiratory failure with hypoxia ([place])  ?ESRD (end stage renal disease) ([place])  ? ? ? ?Rx / DC Orders  ? ?ED Discharge Orders   ? ? None  ? ?  ? ? ? ?Note:  This document was prepared using Dragon voice recognition software and may include unintentional dictation errors. ?  ?Vanessa [place], MD ?01/30/2022 1601 ? ?

## 2022-01-25 NOTE — Consult Note (Incomplete)
CODE SEPSIS - PHARMACY COMMUNICATION ? ?**Broad Spectrum Antibiotics should be administered within 1 hour of Sepsis diagnosis** ? ?Time Code Sepsis Called/Page Received: 1519 ? ?Antibiotics Ordered: 1530 ? ?Time of 1st antibiotic administration: *** ? ?Additional action taken by pharmacy: N/A ? ?If necessary, Name of Provider/Nurse Contacted: N/A ? ? ? ?Darnelle Bos ,PharmD ?Clinical Pharmacist  ?02/10/2022  3:29 PM ? ?

## 2022-01-25 NOTE — Consult Note (Signed)
PHARMACY -  BRIEF ANTIBIOTIC NOTE  ? ?Pharmacy has received consult(s) for Levaquin in the setting of a beta-lactam allergy from an ED provider.  The patient's profile has been reviewed for ht/wt/allergies/indication/available labs.   ? ?Patient has an allergy to cefepime listed as confusion, patient has tolerated ceftriaxone in the past.  ?One time order(s) placed for ceftriaxone 2 g and azithromycin 500 mg IV  ? ?Further antibiotics/pharmacy consults should be ordered by admitting physician if indicated.       ?                ?Thank you, ?Darnelle Bos, PharmD ?02/02/2022  3:31 PM ? ?

## 2022-01-25 NOTE — ED Notes (Signed)
[name], pt wife at bedside. Pt is complaining of pain in his bottom area. He has wounds on his bottom. MD made aware.  ?

## 2022-01-26 ENCOUNTER — Inpatient Hospital Stay: Payer: Medicare Other

## 2022-01-26 DIAGNOSIS — Z7189 Other specified counseling: Secondary | ICD-10-CM

## 2022-01-26 DIAGNOSIS — R4189 Other symptoms and signs involving cognitive functions and awareness: Secondary | ICD-10-CM | POA: Diagnosis not present

## 2022-01-26 LAB — CBC
HCT: 33 % — ABNORMAL LOW (ref 39.0–52.0)
Hemoglobin: 9.5 g/dL — ABNORMAL LOW (ref 13.0–17.0)
MCH: 25.5 pg — ABNORMAL LOW (ref 26.0–34.0)
MCHC: 28.8 g/dL — ABNORMAL LOW (ref 30.0–36.0)
MCV: 88.5 fL (ref 80.0–100.0)
Platelets: 164 10*3/uL (ref 150–400)
RBC: 3.73 MIL/uL — ABNORMAL LOW (ref 4.22–5.81)
RDW: 21.6 % — ABNORMAL HIGH (ref 11.5–15.5)
WBC: 10 10*3/uL (ref 4.0–10.5)
nRBC: 0 % (ref 0.0–0.2)

## 2022-01-26 LAB — BASIC METABOLIC PANEL
Anion gap: 10 (ref 5–15)
BUN: 20 mg/dL (ref 6–20)
CO2: 30 mmol/L (ref 22–32)
Calcium: 8.3 mg/dL — ABNORMAL LOW (ref 8.9–10.3)
Chloride: 98 mmol/L (ref 98–111)
Creatinine, Ser: 2.92 mg/dL — ABNORMAL HIGH (ref 0.61–1.24)
GFR, Estimated: 24 mL/min — ABNORMAL LOW (ref >60)
Glucose, Bld: 74 mg/dL (ref 70–99)
Potassium: 3.1 mmol/L — ABNORMAL LOW (ref 3.5–5.1)
Sodium: 138 mmol/L (ref 135–145)

## 2022-01-26 LAB — PROCALCITONIN: Procalcitonin: 1.11 ng/mL

## 2022-01-26 LAB — GLUCOSE, CAPILLARY
Glucose-Capillary: 62 mg/dL — ABNORMAL LOW (ref 70–99)
Glucose-Capillary: 63 mg/dL — ABNORMAL LOW (ref 70–99)
Glucose-Capillary: 73 mg/dL (ref 70–99)
Glucose-Capillary: 75 mg/dL (ref 70–99)
Glucose-Capillary: 78 mg/dL (ref 70–99)
Glucose-Capillary: 86 mg/dL (ref 70–99)
Glucose-Capillary: 90 mg/dL (ref 70–99)

## 2022-01-26 LAB — HEPATITIS B SURFACE ANTIGEN: Hepatitis B Surface Ag: NONREACTIVE

## 2022-01-26 LAB — MAGNESIUM: Magnesium: 1.8 mg/dL (ref 1.7–2.4)

## 2022-01-26 LAB — PHOSPHORUS: Phosphorus: 2.9 mg/dL (ref 2.5–4.6)

## 2022-01-26 LAB — HEPATITIS B SURFACE ANTIBODY,QUALITATIVE: Hep B S Ab: REACTIVE — AB

## 2022-01-26 MED ORDER — FENTANYL CITRATE PF 50 MCG/ML IJ SOSY: 50.0000 ug | PREFILLED_SYRINGE | Freq: Once | INTRAMUSCULAR | Status: DC

## 2022-01-26 MED ORDER — SEVELAMER CARBONATE 0.8 G PO PACK
0.8000 g | PACK | ORAL | Status: DC
  Filled 2022-01-26 (×3): qty 1

## 2022-01-26 MED ORDER — ASCORBIC ACID 500 MG PO TABS
500.0000 mg | ORAL_TABLET | Freq: Two times a day (BID) | ORAL | Status: DC
  Administered 2022-01-27: 500 mg
  Filled 2022-01-26: qty 1

## 2022-01-26 MED ORDER — LIDOCAINE HCL (PF) 1 % IJ SOLN
5.0000 mL | INTRAMUSCULAR | Status: DC | PRN
  Filled 2022-01-26: qty 5

## 2022-01-26 MED ORDER — ALTEPLASE 2 MG IJ SOLR: 2.0000 mg | Freq: Once | INTRAMUSCULAR | Status: DC | PRN

## 2022-01-26 MED ORDER — FENTANYL 2500MCG IN NS 250ML (10MCG/ML) PREMIX INFUSION
50.0000 ug/h | INTRAVENOUS | Status: DC
  Administered 2022-01-26: 100 ug/h via INTRAVENOUS
  Administered 2022-01-27: 200 ug/h via INTRAVENOUS
  Filled 2022-01-26 (×2): qty 250

## 2022-01-26 MED ORDER — FENTANYL BOLUS VIA INFUSION
50.0000 ug | INTRAVENOUS | Status: DC | PRN
  Administered 2022-01-26 – 2022-01-27 (×3): 100 ug via INTRAVENOUS
  Filled 2022-01-26: qty 100

## 2022-01-26 MED ORDER — ASCORBIC ACID 500 MG PO TABS
500.0000 mg | ORAL_TABLET | Freq: Two times a day (BID) | ORAL | Status: DC
  Administered 2022-01-26: 500 mg via ORAL
  Filled 2022-01-26: qty 1

## 2022-01-26 MED ORDER — TRAMADOL HCL 50 MG PO TABS: 50.0000 mg | ORAL_TABLET | Freq: Four times a day (QID) | ORAL | Status: DC | PRN

## 2022-01-26 MED ORDER — ACETAMINOPHEN 160 MG/5ML PO SOLN
650.0000 mg | Freq: Four times a day (QID) | ORAL | Status: DC | PRN
  Filled 2022-01-26: qty 20.3

## 2022-01-26 MED ORDER — ADULT MULTIVITAMIN W/MINERALS CH
1.0000 | ORAL_TABLET | Freq: Every day | ORAL | Status: DC
Start: 2022-01-27 — End: 2022-01-28
  Administered 2022-01-27: 1
  Filled 2022-01-26: qty 1

## 2022-01-26 MED ORDER — GABAPENTIN 250 MG/5ML PO SOLN
100.0000 mg | Freq: Every day | ORAL | Status: DC
  Filled 2022-01-26 (×2): qty 2

## 2022-01-26 MED ORDER — HYDROCODONE-ACETAMINOPHEN 10-325 MG PO TABS
1.0000 | ORAL_TABLET | Freq: Every day | ORAL | Status: DC
  Administered 2022-01-27 – 2022-01-28 (×2): 1
  Filled 2022-01-26 (×2): qty 1

## 2022-01-26 MED ORDER — ROCURONIUM BROMIDE 10 MG/ML (PF) SYRINGE
70.0000 mg | PREFILLED_SYRINGE | Freq: Once | INTRAVENOUS | Status: AC
  Administered 2022-01-26: 70 mg via INTRAVENOUS
  Filled 2022-01-26: qty 7

## 2022-01-26 MED ORDER — LISINOPRIL 20 MG PO TABS
40.0000 mg | ORAL_TABLET | Freq: Every day | ORAL | Status: DC
  Filled 2022-01-26: qty 2

## 2022-01-26 MED ORDER — AMLODIPINE BESYLATE 10 MG PO TABS: 10.0000 mg | ORAL_TABLET | Freq: Every evening | ORAL | Status: DC

## 2022-01-26 MED ORDER — SODIUM CHLORIDE 0.9 % IV SOLN: 100.0000 mL | INTRAVENOUS | Status: DC | PRN

## 2022-01-26 MED ORDER — HEPARIN SODIUM (PORCINE) 1000 UNIT/ML DIALYSIS
1000.0000 [IU] | INTRAMUSCULAR | Status: DC | PRN
  Filled 2022-01-26: qty 1

## 2022-01-26 MED ORDER — CLONIDINE HCL 0.1 MG PO TABS
0.1000 mg | ORAL_TABLET | Freq: Two times a day (BID) | ORAL | Status: DC
  Administered 2022-01-26 – 2022-01-28 (×2): 0.1 mg
  Filled 2022-01-26 (×3): qty 1

## 2022-01-26 MED ORDER — LIDOCAINE-PRILOCAINE 2.5-2.5 % EX CREA
1.0000 "application " | TOPICAL_CREAM | CUTANEOUS | Status: DC | PRN
  Filled 2022-01-26: qty 5

## 2022-01-26 MED ORDER — ISOSORBIDE DINITRATE 30 MG PO TABS
15.0000 mg | ORAL_TABLET | Freq: Two times a day (BID) | ORAL | Status: DC
  Administered 2022-01-28: 15 mg
  Filled 2022-01-26 (×3): qty 0.5

## 2022-01-26 MED ORDER — ENSURE ENLIVE PO LIQD
237.0000 mL | Freq: Three times a day (TID) | ORAL | Status: DC
  Administered 2022-01-28: 237 mL

## 2022-01-26 MED ORDER — DOCUSATE SODIUM 50 MG/5ML PO LIQD
100.0000 mg | Freq: Two times a day (BID) | ORAL | Status: DC
  Administered 2022-01-26 – 2022-01-27 (×2): 100 mg
  Filled 2022-01-26 (×2): qty 10

## 2022-01-26 MED ORDER — CARVEDILOL 25 MG PO TABS
25.0000 mg | ORAL_TABLET | Freq: Two times a day (BID) | ORAL | Status: DC
  Administered 2022-01-26 – 2022-01-28 (×2): 25 mg
  Filled 2022-01-26 (×3): qty 1

## 2022-01-26 MED ORDER — INSULIN ASPART 100 UNIT/ML IJ SOLN
0.0000 [IU] | INTRAMUSCULAR | Status: DC
  Administered 2022-01-29: 1 [IU] via SUBCUTANEOUS
  Filled 2022-01-26: qty 1

## 2022-01-26 MED ORDER — SODIUM CHLORIDE 0.9 % IV SOLN
3.0000 g | INTRAVENOUS | Status: DC
  Administered 2022-01-26 – 2022-01-29 (×4): 3 g via INTRAVENOUS
  Filled 2022-01-26 (×4): qty 3

## 2022-01-26 MED ORDER — POLYETHYLENE GLYCOL 3350 17 G PO PACK
17.0000 g | PACK | Freq: Every day | ORAL | Status: DC
  Administered 2022-01-27: 17 g
  Filled 2022-01-26: qty 1

## 2022-01-26 MED ORDER — PANTOPRAZOLE SODIUM 40 MG IV SOLR
40.0000 mg | Freq: Two times a day (BID) | INTRAVENOUS | Status: DC
  Administered 2022-01-26 – 2022-01-31 (×10): 40 mg via INTRAVENOUS
  Filled 2022-01-26 (×10): qty 10

## 2022-01-26 MED ORDER — PENTAFLUOROPROP-TETRAFLUOROETH EX AERO
1.0000 "application " | INHALATION_SPRAY | CUTANEOUS | Status: DC | PRN
  Filled 2022-01-26: qty 30

## 2022-01-26 MED ORDER — DEXTROSE 50 % IV SOLN
12.5000 g | INTRAVENOUS | Status: AC
  Administered 2022-01-27: 12.5 g via INTRAVENOUS

## 2022-01-26 MED ORDER — MIDAZOLAM HCL 2 MG/2ML IJ SOLN
2.0000 mg | INTRAMUSCULAR | Status: DC | PRN
  Administered 2022-01-27 (×2): 2 mg via INTRAVENOUS
  Filled 2022-01-26 (×2): qty 2

## 2022-01-26 MED ORDER — ATORVASTATIN CALCIUM 20 MG PO TABS
80.0000 mg | ORAL_TABLET | Freq: Every day | ORAL | Status: DC
  Administered 2022-01-26: 80 mg
  Filled 2022-01-26: qty 4

## 2022-01-26 MED ORDER — FENTANYL CITRATE (PF) 100 MCG/2ML IJ SOLN
100.0000 ug | Freq: Once | INTRAMUSCULAR | Status: AC
  Administered 2022-01-26: 100 ug via INTRAVENOUS
  Filled 2022-01-26: qty 2

## 2022-01-26 MED ORDER — SODIUM CHLORIDE 0.9 % IV SOLN
750.0000 mg | Freq: Once | INTRAVENOUS | Status: AC
  Administered 2022-01-26: 750 mg via INTRAVENOUS
  Filled 2022-01-26: qty 7.5

## 2022-01-26 MED ORDER — SEVELAMER CARBONATE 0.8 G PO PACK
0.8000 g | PACK | ORAL | Status: DC
  Administered 2022-01-27: 0.8 g
  Filled 2022-01-26 (×3): qty 1

## 2022-01-26 MED ORDER — ENSURE ENLIVE PO LIQD: 237.0000 mL | Freq: Three times a day (TID) | ORAL | Status: DC

## 2022-01-26 MED ORDER — MIDAZOLAM HCL 2 MG/2ML IJ SOLN
2.0000 mg | Freq: Once | INTRAMUSCULAR | Status: AC
  Administered 2022-01-26: 2 mg via INTRAVENOUS
  Filled 2022-01-26: qty 2

## 2022-01-26 MED ORDER — ETOMIDATE 2 MG/ML IV SOLN
20.0000 mg | Freq: Once | INTRAVENOUS | Status: AC
  Administered 2022-01-26: 20 mg via INTRAVENOUS
  Filled 2022-01-26: qty 10

## 2022-01-26 MED ORDER — DEXTROSE 50 % IV SOLN: 12.5000 g | INTRAVENOUS | Status: AC

## 2022-01-26 MED ORDER — HYDRALAZINE HCL 50 MG PO TABS
100.0000 mg | ORAL_TABLET | Freq: Three times a day (TID) | ORAL | Status: DC
  Administered 2022-01-26: 100 mg
  Filled 2022-01-26 (×2): qty 2

## 2022-01-26 MED ORDER — DEXTROSE 50 % IV SOLN
INTRAVENOUS | Status: AC
Start: 2022-01-26 — End: 2022-01-26
  Administered 2022-01-26: 12.5 g via INTRAVENOUS
  Filled 2022-01-26: qty 50

## 2022-01-26 NOTE — Procedures (Signed)
?PROCEDURE: BRONCHOSCOPY ?Therapeutic Aspiration of Tracheobronchial Tree ?And Bronchoalveolar lavage  ? ?PROCEDURE DATE: 01/26/2022  TIME:  ?NAME:  [name]  DOB:07-06-1961  MRN: [id number] ?LOC:  IC08A/IC08A-AA    HOSP DAY: _0 @ ?CODE STATUS:   ? ?  ?Code Status Orders  ?(From admission, onward)  ?  ? ? ?  ? ?  Start     Ordered  ? 02/06/2022 1606  Full code  Continuous       ? 02/04/2022 1606  ? ?  ?  ? ?  ? ?Code Status History   ? ? Date Active Date Inactive Code Status Order ID Comments User Context  ? 11/06/2021 2011 11/12/2021 1916 Full Code [id number]  Collier Bullock, MD Inpatient  ? 07/27/2021 1321 08/02/2021 2249 Full Code [id number]  Ivor Costa, MD ED  ? 06/09/2021 1039 06/13/2021 2348 Full Code [id number]  Collier Bullock, MD ED  ? 05/18/2021 0539 05/26/2021 2146 Full Code [id number]  Rise Patience, MD ED  ? 05/12/2021 2306 05/15/2021 2240 Full Code [id number]  Clance Boll, MD ED  ? 02/06/2021 2254 02/10/2021 2225 Full Code [id number]  Athena Masse, MD ED  ? 11/18/2020 1508 11/25/2020 1534 Full Code [id number]  Ivor Costa, MD ED  ? 09/12/2020 1917 09/14/2020 2156 Full Code [id number]  Para Skeans, MD ED  ? 01/05/2019 2016 01/08/2019 1759 Full Code [id number]  Fritzi Mandes, MD Inpatient  ? 12/04/2017 0511 12/12/2017 2355 Full Code [id number]  SalaryAvel Peace, MD Inpatient  ? 08/22/2017 1244 08/22/2017 1800 Full Code [id number]  Algernon Huxley, MD Inpatient  ? 04/21/2017 1819 04/25/2017 1915 Full Code [id number]  Dustin Flock, MD Inpatient  ? 11/06/2015 2035 11/07/2015 1818 Full Code [id number]  Aldean Jewett, MD ED  ? ?  ?   ? ? ? ?Indications/Preliminary Diagnosis:  ? ?Consent: (Place X beside choice/s below) ? The benefits, risks and possible complications of the procedure were        explained to: ? ___ patient ? _x__ patient's family ? ___ other:___________  ?who verbalized understanding and gave: ? ___ verbal ? ___ written ? ___ verbal and written ? ___ telephone ? ___ other:________ consent.  ?   ?  Unable to obtain consent; procedure performed on emergent basis.   ?  Other:   ? ? ?  ?PRESEDATION ASSESSMENT: History and Physical has been performed. Patient meds and allergies have been reviewed. Presedation airway examination has been performed and documented. Baseline vital signs, sedation score, oxygenation status, and cardiac rhythm were reviewed. Patient was deemed to be in satisfactory condition to undergo the procedure.  ? ? ?PREMEDICATIONS:  ? Sedative/Narcotic Amt Dose  ? Versed 2 mg  ? Fentanyl 100 mcg  ?Diprivan 0 mg  ?    ? ? ? ? ? ? ?PROCEDURE DETAILS: Timeout performed and correct patient, name, & ID confirmed. Following prep per Pulmonary policy, appropriate sedation was administered. The Bronchoscope was inserted in to oral cavity with bite block in place. Therapeutic aspiration of Tracheobronchial tree was performed.  Airway exam proceeded with findings, technical procedures, and specimen collection as noted below. At the end of exam the scope was withdrawn without incident. Impression and Plan as noted below.  ? ? ? ? ? ? ? ? ? Airway Prep (Place X beside choice below)  ? 1% Transtracheal Lidocaine Anesthetization 7 cc  ? Patient prepped per Bronchoscopy Lab Policy  ?   ? ? Insertion Route (Place X beside choice below)  ?  Nasal  ? Oral  ?x Endotracheal Tube  ? Tracheostomy  ? ?INTRAPROCEDURE MEDICATIONS: ? Sedative/Narcotic Amt Dose  ? Versed  mg  ? Fentanyl gtt mcg  ?Diprivan  mg  ?    ? ?Medication Amt Dose  Medication Amt Dose  ?Lidocaine 1%  cc  Epinephrine 1:10,000 sol  cc  ?Xylocaine 4%  cc  Cocaine  cc  ? ?TECHNICAL PROCEDURES: (Place X beside choice below) ?  Procedures  Description  ?  None   ?  Electrocautery   ?  Cryotherapy   ?  Balloon Dilatation   ?  Bronchography   ?  Stent Placement   ?x  Therapeutic Aspiration RUL, RML, RLL, left mainstem  ?  Laser/Argon Plasma   ? Brachytherapy Catheter Placement   ? Foreign Body Removal    ? ? ? ? ? ?SPECIMENS (Sites): (Place X beside  choice below) ? Specimens Description  ? No Specimens Obtained    ? Washings   ?x Lavage RML  ? Biopsies   ? Fine Needle Aspirates   ? Brushings   ? Sputum   ? ?FINDINGS:  ?ESTIMATED BLOOD LOSS: none ?COMPLICATIONS/RESOLUTION: none ? ? ? ? ? ?IMPRESSION:POST-PROCEDURE DX:  ? ?Severe mucus plugging of right lung and partial mucus plugging of Left lung ? ?RECOMMENDATION/PLAN:  ? ?BAL from RML awaiting microbiology ? ? ? ?Ottie Glazier, M.D.  ?Pulmonary & Critical Care Medicine  ?[place]  ? ? ? ?

## 2022-01-26 NOTE — [place] FL2 (Signed)
?[place] MEDICAID FL2 LEVEL OF CARE SCREENING TOOL  ?  ? ?IDENTIFICATION  ?Patient Name: ?[name] Birthdate: 1961-05-27 Sex: male Admission Date (Current Location): ?02/07/2022  ?[place] and [place] Number: ? [place] ?  Facility and Address:  ?[facility], [address], [place], [place] [address] ?     Provider Number: ?[id number]  ?Attending Physician Name and Address:  ?Ottie Glazier, MD ? Relative Name and Phone Number:  ?[name] Caillier- spouse [phone] ?   ?Current Level of Care: ?Hospital Recommended Level of Care: ?[place] Prior Approval Number: ?  ? ?Date Approved/Denied: ?  PASRR Number: ?  ? ?Discharge Plan: ?SNF ?  ? ?Current Diagnoses: ?Patient Active Problem List  ? Diagnosis Date Noted  ? Unresponsiveness 01/23/2022  ? Mucus plugging of bronchi 02/02/2022  ? HLD (hyperlipidemia) 02/14/2022  ? Elevated troponin 01/27/2022  ? Multifocal pneumonia   ? Type 2 diabetes mellitus with diabetic neuropathy, without long-term current use of insulin ([place]) 07/27/2021  ? Anemia in ESRD (end-stage renal disease) ([place]) 07/27/2021  ? Hypoglycemia 07/27/2021  ? Hypothermia 07/27/2021  ? COVID-19 virus infection 07/27/2021  ? Aspiration pneumonia ([place]) 07/27/2021  ? Unstageable pressure ulcer of sacral region ([place]) 05/22/2021  ? Acute decompensated heart failure ([place]) 05/19/2021  ? Acute respiratory failure with hypoxia ([place]) 05/18/2021  ? Protein-calorie malnutrition, severe 05/14/2021  ? Acute pulmonary edema (H[facility])   ? Shortness of breath   ? Fluid overload 05/12/2021  ? Cellulitis and abscess of buttock   ? History of GI bleed 02/06/2021  ? Long term current use of immunosuppressive drug 02/06/2021  ? Disorder of skin due to Crohn's disease ([place]) 02/06/2021  ? Complication of vascular access for dialysis 01/22/2021  ? Polyp of transverse colon   ? Acute metabolic encephalopathy 17/91/5056  ? Acute on chronic anemia 11/18/2020  ? Chronic systolic CHF  (congestive heart failure) ([place]) 11/18/2020  ? Cerebrovascular disease 09/12/2020  ? Transient alteration of awareness 09/12/2020  ? Hyperkalemia 09/12/2020  ? Mild cognitive impairment 09/30/2019  ? Peripheral vascular disease ([place]) 06/28/2018  ? Sepsis ([place]) 01/12/2018  ? Pressure injury of skin 12/04/2017  ? S/P bilateral BKA (below knee amputation) ([place]) 10/20/2017  ? Atherosclerosis of native arteries of extremity with rest pain ([place]) 08/05/2017  ? History of CVA (cerebrovascular accident) 04/15/2017  ? Seizure disorder ([place]) 04/15/2017  ? ESRD (end stage renal disease) ([place]) 04/12/2016  ? Aphthae 02/20/2016  ? Hidradenitis suppurativa 02/20/2016  ? Leg pain 02/20/2016  ? Neuropathy 02/20/2016  ? Narrowing of intervertebral disc space 08/29/2015  ? Vascular disorder of lower extremity 08/29/2015  ? Failure of erection 08/29/2015  ? Hyperlipidemia 08/29/2015  ? Essential hypertension 08/29/2015  ? Anemia due to chronic kidney disease 05/26/2015  ? Venous insufficiency of leg 09/04/2014  ? History of deep vein thrombosis (DVT) of lower extremity 08/16/2014  ? Prostatic intraepithelial neoplasia 11/02/2013  ? Elevated prostate specific antigen (PSA) 09/11/2013  ? Benign prostatic hyperplasia with urinary obstruction 08/13/2013  ? Spermatocele 08/13/2013  ? Avitaminosis D 01/25/2013  ? Type 2 diabetes mellitus with hypoglycemia without coma ([place]) 03/28/2012  ? Crohn disease ([place]) 08/03/2011  ? ? ?Orientation RESPIRATION BLADDER Height & Weight   ?  ?Self ? O2  (anuric) Weight: 73.8 kg ?Height:  5' 11"  (180.3 cm)  ?BEHAVIORAL SYMPTOMS/MOOD NEUROLOGICAL BOWEL NUTRITION STATUS  ?    Continent Diet (see discharge summary)  ?AMBULATORY STATUS COMMUNICATION OF NEEDS Skin   ?Extensive Assist Verbally PU  Stage and Appropriate Care (several areas of drainage from left upper leg posterior, stage 2 to cocyxx - foam dressings) ?  ?PU Stage 2 Dressing: Daily ?  ?    ?     ?     ? ? ?Personal Care Assistance Level of Assistance   ?Bathing, Feeding, Dressing Bathing Assistance: Limited assistance ?Feeding assistance: Limited assistance ?Dressing Assistance: Limited assistance ?   ? ?Functional Limitations Info  ?Sight, Hearing, Speech Sight Info: Adequate ?Hearing Info: Adequate ?Speech Info: Adequate  ? ? ?SPECIAL CARE FACTORS FREQUENCY  ?    ?  ?  ?  ?  ?  ?  ?   ? ? ?Contractures Contractures Info: Not present  ? ? ?Additional Factors Info  ?Code Status, Allergies Code Status Info: Full ?Allergies Info: Methotrexate, vancomycin, cefepime, tape ?  ?  ?  ?   ? ?Current Medications (01/26/2022):  This is the current hospital active medication list ?Current Facility-Administered Medications  ?Medication Dose Route Frequency Provider Last Rate Last Admin  ? 0.9 %  sodium chloride infusion  100 mL Intravenous PRN Lateef, Munsoor, MD      ? 0.9 %  sodium chloride infusion  100 mL Intravenous PRN Lateef, Munsoor, MD      ? acetaminophen (TYLENOL) tablet 650 mg  650 mg Oral Q6H PRN Ivor Costa, MD      ? albuterol (PROVENTIL) (2.5 MG/3ML) 0.083% nebulizer solution 2.5 mg  2.5 mg Nebulization Q4H PRN Ivor Costa, MD      ? alteplase (CATHFLO ACTIVASE) injection 2 mg  2 mg Intracatheter Once PRN Holley Raring, Munsoor, MD      ? amLODipine (NORVASC) tablet 10 mg  10 mg Oral QPM Ivor Costa, MD      ? Ampicillin-Sulbactam (UNASYN) 3 g in sodium chloride 0.9 % 100 mL IVPB  3 g Intravenous Q24H Mickeal Skinner A, RPH      ? ascorbic acid (VITAMIN C) tablet 500 mg  500 mg Oral BID Sreenath, Sudheer B, MD      ? atorvastatin (LIPITOR) tablet 80 mg  80 mg Oral QHS Ivor Costa, MD      ? carvedilol (COREG) tablet 25 mg  25 mg Oral BID Ivor Costa, MD   25 mg at 01/26/22 0800  ? Chlorhexidine Gluconate Cloth 2 % PADS 6 each  6 each Topical Q0600 Ivor Costa, MD   6 each at 01/28/2022 1605  ? Chlorhexidine Gluconate Cloth 2 % PADS 6 each  6 each Topical Q0600 Lateef, Munsoor, MD      ? cloNIDine (CATAPRES) tablet 0.1 mg  0.1 mg Oral BID Ivor Costa, MD   0.1 mg at 01/26/22  0800  ? dextromethorphan-guaiFENesin (MUCINEX DM) 30-600 MG per 12 hr tablet 1 tablet  1 tablet Oral BID PRN Ivor Costa, MD      ? docusate (COLACE) 50 MG/5ML liquid 100 mg  100 mg Per Tube BID Teressa Lower, NP      ? Derrill Memo ON 01/27/2022] feeding supplement (ENSURE ENLIVE / ENSURE PLUS) liquid 237 mL  237 mL Oral TID BM Sreenath, Sudheer B, MD      ? fentaNYL (SUBLIMAZE) bolus via infusion 50-100 mcg  50-100 mcg Intravenous Q15 min PRN Teressa Lower, NP      ? fentaNYL (SUBLIMAZE) injection 50 mcg  50 mcg Intravenous Once Teressa Lower, NP      ? fentaNYL 2573mg in NS 2527m(1026mml) infusion-PREMIX  50-200 mcg/hr Intravenous Continuous NelTeressa Lower  NP 10 mL/hr at 01/26/22 1147 100 mcg/hr at 01/26/22 1147  ? gabapentin (NEURONTIN) capsule 100 mg  100 mg Oral QHS Ivor Costa, MD      ? heparin injection 1,000 Units  1,000 Units Dialysis PRN Anthonette Legato, MD      ? heparin injection 5,000 Units  5,000 Units Subcutaneous Q8H Ivor Costa, MD   5,000 Units at 01/26/22 5872  ? hydrALAZINE (APRESOLINE) injection 5 mg  5 mg Intravenous Q2H PRN Ivor Costa, MD      ? hydrALAZINE (APRESOLINE) tablet 100 mg  100 mg Oral Q8H Ivor Costa, MD      ? HYDROcodone-acetaminophen (NORCO) 10-325 MG per tablet 1 tablet  1 tablet Oral Daily Ivor Costa, MD      ? insulin aspart (novoLOG) injection 0-5 Units  0-5 Units Subcutaneous QHS Ivor Costa, MD      ? insulin aspart (novoLOG) injection 0-9 Units  0-9 Units Subcutaneous TID WC Ivor Costa, MD      ? ipratropium-albuterol (DUONEB) 0.5-2.5 (3) MG/3ML nebulizer solution 3 mL  3 mL Nebulization Q4H Ivor Costa, MD   3 mL at 01/26/22 1144  ? isosorbide mononitrate (IMDUR) 24 hr tablet 30 mg  30 mg Oral Daily Ivor Costa, MD   30 mg at 01/26/22 1230  ? levETIRAcetam (KEPPRA) 750 mg in sodium chloride 0.9 % 100 mL IVPB  750 mg Intravenous Once Ottie Glazier, MD      ? levETIRAcetam (KEPPRA) tablet 750 mg  750 mg Oral Q1200 Wynelle Cleveland, RPH      ? lidocaine (PF) (XYLOCAINE) 1 %  injection 5 mL  5 mL Intradermal PRN Lateef, Munsoor, MD      ? lidocaine-prilocaine (EMLA) cream 1 application.  1 application. Topical PRN Lateef, Munsoor, MD      ? lisinopril (ZESTRIL) tablet 40 mg  40 mg O

## 2022-01-26 NOTE — Consult Note (Signed)
? ?NAME:  [name], MRN:  [id number], DOB:  1961/01/23, LOS: 1 ?ADMISSION DATE:  01/26/2022, CONSULTATION DATE:  01/31/2022 ?REFERRING MD:  Dr. Blaine Hamper, CHIEF COMPLAINT:  Unresponsiveness, Hypoxia  ? ?Brief Pt Description / Synopsis:  ?61 y.o. Male admitted with Acute Hypoxic and Hypercapnic Respiratory Failure in the setting of Volume Overload/Pulmonary Edema, Mucus Plugging, and possible Pneumonia requiring BiPAP. ? ?History of Present Illness:  ?[name] is a 61 year old male with a significant past medical history as listed below who presented to [facility] ED on 01/31/2022 from his outpatient hemodialysis center due to unresponsiveness and hypoxia. ? ?Patient is currently lethargic and BiPAP mask is in place, therefore history is obtained from patient's spouse [name] at bedside, along with chart review.  Reportedly patient went to dialysis for his regular scheduled dialysis today, but about 15 minutes into the session, he became unresponsive and was noted to be hypoxic with O2 saturations in the 70s.  He was noted to have multiple secretions that had to be suctioned.  EMS placed nonrebreather mask with improvement of O2 sats into the 80s.  Upon arrival to the ED he was placed on BiPAP.  His mental status improved on BiPAP and no focal neuro deficits were noted. ? ?ED Course: ?Triage vital signs: Temperature 98 Fahrenheit orally, respiratory rate 22, pulse 77, blood pressure 163/73, SPO2 79% on nonrebreather ?Significant labs: Potassium 3.4, chloride 96, glucose 158, BUN 30, creatinine 4.13, BNP greater than 4500, high-sensitivity troponin 19, procalcitonin 0.33, lactic acid 0.9, WBC 12.6 with neutrophilia, hemoglobin 10.6, hematocrit 37.9 ?VBG on nonrebreather mask: pH 7.28/PCO2 76/PO2 40/bicarbonate 35.7 ?COVID-19 and influenza PCR negative ?Imaging: ?Chest x-ray>>IMPRESSION: ?1. Complete opacification of the right lung with associated volume ?loss, presumably secondary to mucous plugging. ?2. Improved aeration  of the left lung with mild residual pulmonary ?venous congestion. ?Medications administered: Azithromycin and ceftriaxone ? ?He was admitted to the stepdown unit by the hospitalist for further work-up and treatment.   ? ?PCCM is asked for pulmonary consultation to further evaluate mucous plugging and need for possible bronchoscopy. ? ?Pertinent  Medical History  ?ESRD on Hemodialysis ?HFrEF (LVEF 30-35%) ?Hypertension ?Hyperlipidemia ?Peripheral Vascular Disease s/p bilateral BKA's ?Diabetes Mellitus ?Stroke ?ICH ?Seizure ?DVT ?Crohn's Disease ?Peritonitis ?BPH ?Anemia ?Hidradenitis Suppurativa on [name] ? ?Micro Data:  ?4/10: SARS-CoV-2 & Influenza PCR>>negative ?4/10: Blood culture>> ?4/10: Sputum>> ?4/10: Strep pneumo urinary antigen>> ?4/10: Legionella urinary antigen>> ? ?Antimicrobials:  ?Azithromycin 4/10>> ?Ceftriaxone 4/10>> ? ?Significant Hospital Events: ?Including procedures, antibiotic start and stop dates in addition to other pertinent events   ?4/10: Admitted to stepdown by the hospitalist.  PCCM asked for pulmonary consult due to mucous plugging and possible need for bronchoscopy ? ?Interim History / Subjective:  ?-Patient met about the hospitalist ?-Currently on BiPAP and tolerating, looks comfortable, able arouse to voice and follows simple commands ?-PCCM consulted due to right mucous plugging and possible need for bronchoscopy ?-Discussed with Dr. Rexene Agent, will implement conservative measures and aggressive pulmonary toilet for now and reevaluate with chest x-ray in the morning ?-4/11 - patient has not had improvement with non invasive modalities an dremains with collapse of right lung hypoxemic. Plan for bronchoscopy today ? ?Objective   ?Blood pressure (!) 151/69, pulse 77, temperature 97.8 ?F (36.6 ?C), resp. rate 18, height 5' 11"  (1.803 m), weight 73.8 kg, SpO2 100 %. ?   ?Vent Mode: PRVC ?FiO2 (%):  [35 %-60 %] 40 % ?Set Rate:  [18 bmp] 18 bmp ?Vt Set:  [400 mL] 400  mL ?PEEP:  [5 cmH20] 5  cmH20 ?Plateau Pressure:  [33 cmH20] 33 cmH20  ? ?Intake/Output Summary (Last 24 hours) at 01/26/2022 1228 ?Last data filed at 01/26/2022 0115 ?Gross per 24 hour  ?Intake 350 ml  ?Output 1500 ml  ?Net -1150 ml  ? ?Filed Weights  ? 01/18/2022 1338 02/06/2022 2246 01/26/22 0115  ?Weight: 75.3 kg 75.3 kg 73.8 kg  ? ? ?Examination: ?General: Acute on chronically ill-appearing frail male, sitting in bed, on BiPAP, no acute distress ?HENT: Atraumatic, normocephalic, neck supple, no JVD ?Lungs: Coarse breath sounds throughout with right lower field diminished, BiPAP assisted, even, nonlabored ?Cardiovascular: Bradycardia, regular rhythm, S1-S2, no murmurs, rubs, gallop ?Abdomen: Soft, nontender, nondistended, no guarding rebound tenderness, bowel sounds positive x4 ?Extremities: Bilateral BKA's well-healed, no cyanosis or clubbing ?Neuro: Lethargic, arouses easily to voice, moves all extremities to command, no focal deficits, difficult to assess orientation due to BiPAP ?GU: Deferred ? ?Resolved Hospital Problem list   ? ? ?Assessment & Plan:  ? ?Acute Hypoxic and Hypercapnic Respiratory Failure in the setting of Volume Overload/Pulmonary Edema, Mucus Plugging, and suspected Pneumonia ?-Supplemental O2 as needed to maintain O2 sats >92% ?-BiPAP, wean as tolerated ?-Follow intermittent Chest X-ray & ABG as needed ?-Bronchodilators ?-ABX as above ?-Volume removal with Hemodialysis ?-Discussed with Dr. Lanney [name] ~ will implement Aggressive Pulmonary toilet as able (Incentive spirometry/flutter valve, chest PT) with follow up CXR in the morning to see if Bronchoscopy is needed ? ?Acute on Chronic HFrEF (LVEF 30-35%) ?Mildly elevated troponin, suspect demand ischemia ?Hypertension ?-Continuous cardiac monitoring ?-Maintain MAP >65 ?-BNP >4500 ?-Volume removal with Hemodialysis ?-HS Troponin peaked at 19 ?-Lactic acid is normal at 0.9 ?-Echocardiogram 05/13/21: LVEF 30-35%, Grade II diastolic dysfunction, moderately reduced RV systolic  function, mild to moderate Mitral regurgitation, moderate tricuspid regurgitation ?-Resume home antihypertensives when able to tolerate p.o. ? ?Concern for possible Pneumonia ?Meet SIRS criteria upon admission (RR 22, WBC 12.6) ?-Monitor fever curve ?-Trend WBC's & Procalcitonin ?-Follow cultures as above ?-Continue empiric Azithromycin & Ceftriaxone pending cultures & sensitivities ? ?ESRD on Hemodialysis ?Mild Hypokalemia ?-Monitor I&O's / urinary output ?-Follow BMP ?-Ensure adequate renal perfusion ?-Avoid nephrotoxic agents as able ?-Replace electrolytes as indicated ?-Nephrology consulted, appreciate input ~ HD as per Nephrology ? ?Acute Metabolic Encephalopathy, suspect due to Hypoxia ?PMHx: Stroke, Seizure disorder ?-Provide supportive care ?-Treatment of Hypoxia as outlined above ?-Avoid sedating meds ?-Continue home Keppra ? ?Anemia of Chronic Disease ?-Monitor for S/Sx of bleeding ?-Trend CBC ?-Heparin SQ for VTE Prophylaxis  ?-Transfuse for Hgb <7 ? ?Diabetes Mellitus ?-CBG's q4h; Target range of 140 to 180 ?-SSI ?-Follow ICU Hypo/Hyperglycemia protocol ? ? ? ? ? ?Best Practice (right click and "Reselect all SmartList Selections" daily)  ? ?Diet/type: NPO while on BiPAP ?DVT prophylaxis: prophylactic heparin  ?GI prophylaxis: N/A ?Lines: N/A ?Foley:  N/A ?Code Status:  full code ?Last date of multidisciplinary goals of care discussion [N/A] ? ?Updated pt's spouse [name] at bedside 4/10.  All questions answered to her satisfaction. ? ?Labs   ?CBC: ?Recent Labs  ?Lab 01/29/2022 ?1333 01/26/22 ?0434  ?WBC 12.6* 10.0  ?NEUTROABS 10.0*  --   ?HGB 10.6* 9.5*  ?HCT 37.9* 33.0*  ?MCV 90.2 88.5  ?PLT 202 164  ? ? ? ?Basic Metabolic Panel: ?Recent Labs  ?Lab 01/24/2022 ?1333 01/26/22 ?0434 01/26/22 ?5790  ?NA 137 138  --   ?K 3.4* 3.1*  --   ?CL 96* 98  --   ?CO2 31 30  --   ?  GLUCOSE 158* 74  --   ?BUN 30* 20  --   ?CREATININE 4.13* 2.92*  --   ?CALCIUM 8.3* 8.3*  --   ?MG  --   --  1.8  ?PHOS  --  2.9  --    ? ? ?GFR: ?Estimated Creatinine Clearance: 28.4 mL/min (A) (by C-G formula based on SCr of 2.92 mg/dL (H)). ?Recent Labs  ?Lab 01/26/2022 ?1333 02/10/2022 ?1628 02/07/2022 ?1942 01/26/22 ?0434  ?PROCALCITON 0.33  -

## 2022-01-26 NOTE — Progress Notes (Signed)
Brief progress note.  Nonbillable note.  Patient was admitted 4/10 to hospitalist service.  Required NIPPV.  Per chart review patient went to regular scheduled dialysis but became unresponsive and hypoxic about 15 minutes into the session.  Transported to the ED.  Significant infiltrate/whiteout noted on right lung on chest x-ray.  Worsening on 4/11. ? ?Case discussed with PCCM attending.  Suspect mucous plugging.  Patient will need bronchoscopy.  Patient required endotracheal intubation and mechanical ventilation to undergo procedure.   ? ?PCCM to assume primary care of this patient at this time.   ?[facility] hospitalist service to sign off. ? ?Please reach out to [place] when patient stable for transfer back to general medical service. ? ?Ralene Muskrat MD ? ?No charge ?

## 2022-01-26 NOTE — Progress Notes (Signed)
? ?NAME:  [name], MRN:  [id number], DOB:  1961-09-07, LOS: 1 ?ADMISSION DATE:  01/22/2022, CONSULTATION DATE:  01/29/2022 ?REFERRING MD:  Dr. Blaine Hamper, CHIEF COMPLAINT:  Unresponsiveness, Hypoxia  ? ?Brief Pt Description / Synopsis:  ?61 y.o. Male admitted with Acute Hypoxic and Hypercapnic Respiratory Failure in the setting of Volume Overload/Pulmonary Edema, Mucus Plugging, and possible Pneumonia requiring BiPAP. ? ?History of Present Illness:  ?[name] is a 61 year old male with a significant past medical history as listed below who presented to [facility] ED on 02/12/2022 from his outpatient hemodialysis center due to unresponsiveness and hypoxia. ? ?Patient is currently lethargic and BiPAP mask is in place, therefore history is obtained from patient's spouse [name] at bedside, along with chart review.  Reportedly patient went to dialysis for his regular scheduled dialysis today, but about 15 minutes into the session, he became unresponsive and was noted to be hypoxic with O2 saturations in the 70s.  He was noted to have multiple secretions that had to be suctioned.  EMS placed nonrebreather mask with improvement of O2 sats into the 80s.  Upon arrival to the ED he was placed on BiPAP.  His mental status improved on BiPAP and no focal neuro deficits were noted. ? ?01/26/22 - patient failed SBT post procedure due to low Vt on pressure support 5/5.  ? ?Pertinent  Medical History  ?ESRD on Hemodialysis ?HFrEF (LVEF 30-35%) ?Hypertension ?Hyperlipidemia ?Peripheral Vascular Disease s/p bilateral BKA's ?Diabetes Mellitus ?Stroke ?ICH ?Seizure ?DVT ?Crohn's Disease ?Peritonitis ?BPH ?Anemia ?Hidradenitis Suppurativa on [name] ? ?Micro Data:  ?4/10: SARS-CoV-2 & Influenza PCR>>negative ?4/10: Blood culture>> ?4/10: Sputum>> ?4/10: Strep pneumo urinary antigen>> ?4/10: Legionella urinary antigen>> ? ?Antimicrobials:  ?Azithromycin 4/10>> ?Ceftriaxone 4/10>> ? ?Significant Hospital Events: ?Including procedures, antibiotic  start and stop dates in addition to other pertinent events   ?4/10: Admitted to stepdown by the hospitalist.  PCCM asked for pulmonary consult due to mucous plugging and possible need for bronchoscopy ? ? ? ?Objective   ?Blood pressure (!) 129/51, pulse 72, temperature 97.8 ?F (36.6 ?C), resp. rate 18, height 5' 11"  (1.803 m), weight 73.8 kg, SpO2 96 %. ?   ?FiO2 (%):  [35 %-60 %] 35 %  ? ?Intake/Output Summary (Last 24 hours) at 01/26/2022 0846 ?Last data filed at 01/26/2022 0115 ?Gross per 24 hour  ?Intake 350 ml  ?Output 1500 ml  ?Net -1150 ml  ? ?Filed Weights  ? 02/06/2022 1338 01/26/2022 2246 01/26/22 0115  ?Weight: 75.3 kg 75.3 kg 73.8 kg  ? ? ?Examination: ?General: Acute on chronically ill-appearing frail male, sitting in bed, on BiPAP, no acute distress ?HENT: Atraumatic, normocephalic, neck supple, no JVD ?Lungs: Coarse breath sounds throughout with right lower field diminished, BiPAP assisted, even, nonlabored ?Cardiovascular: Bradycardia, regular rhythm, S1-S2, no murmurs, rubs, gallop ?Abdomen: Soft, nontender, nondistended, no guarding rebound tenderness, bowel sounds positive x4 ?Extremities: Bilateral BKA's well-healed, no cyanosis or clubbing ?Neuro: Lethargic, arouses easily to voice, moves all extremities to command, no focal deficits, difficult to assess orientation due to BiPAP ?GU: Deferred ? ?Resolved Hospital Problem list   ? ? ?Assessment & Plan:  ? ?Acute Hypoxic and Hypercapnic Respiratory Failure in the setting of Volume Overload/Pulmonary Edema, Mucus Plugging, and suspected Pneumonia ?-Supplemental O2 as needed to maintain O2 sats >92% ?-BiPAP, wean as tolerated ?-Follow intermittent Chest X-ray & ABG as needed ?-Bronchodilators ?-ABX as above ?-Volume removal with Hemodialysis ?-s/p bronch - heavy mucus plugging worse on right ? ?Acute on Chronic HFrEF (LVEF 30-35%) ?  Mildly elevated troponin, suspect demand ischemia ?Hypertension ?-Continuous cardiac monitoring ?-Maintain MAP >65 ?-BNP  >4500 ?-Volume removal with Hemodialysis ?-HS Troponin peaked at 19 ?-Lactic acid is normal at 0.9 ?-Echocardiogram 05/13/21: LVEF 30-35%, Grade II diastolic dysfunction, moderately reduced RV systolic function, mild to moderate Mitral regurgitation, moderate tricuspid regurgitation ?-Resume home antihypertensives when able to tolerate p.o. ? ?Concern for possible Pneumonia ?Meet SIRS criteria upon admission (RR 22, WBC 12.6) ?-Monitor fever curve ?-Trend WBC's & Procalcitonin ?-Follow cultures as above ?-Continue empiric Azithromycin & Ceftriaxone pending cultures & sensitivities ? ?ESRD on Hemodialysis ?Mild Hypokalemia ?-Monitor I&O's / urinary output ?-Follow BMP ?-Ensure adequate renal perfusion ?-Avoid nephrotoxic agents as able ?-Replace electrolytes as indicated ?-Nephrology consulted, appreciate input ~ HD as per Nephrology ? ?Acute Metabolic Encephalopathy, suspect due to Hypoxia ?PMHx: Stroke, Seizure disorder ?-Provide supportive care ?-Treatment of Hypoxia as outlined above ?-Avoid sedating meds ?-Continue home Keppra ? ?Anemia of Chronic Disease ?-Monitor for S/Sx of bleeding ?-Trend CBC ?-Heparin SQ for VTE Prophylaxis  ?-Transfuse for Hgb <7 ? ?Diabetes Mellitus ?-CBG's q4h; Target range of 140 to 180 ?-SSI ?-Follow ICU Hypo/Hyperglycemia protocol ? ? ? ? ? ?Best Practice (right click and "Reselect all SmartList Selections" daily)  ? ?Diet/type: NPO while on BiPAP ?DVT prophylaxis: prophylactic heparin  ?GI prophylaxis: N/A ?Lines: N/A ?Foley:  N/A ?Code Status:  full code ?Last date of multidisciplinary goals of care discussion [N/A] ? ?Updated pt's spouse [name] at bedside 4/10.  All questions answered to her satisfaction. ? ?Labs   ?CBC: ?Recent Labs  ?Lab 01/16/2022 ?1333 01/26/22 ?0434  ?WBC 12.6* 10.0  ?NEUTROABS 10.0*  --   ?HGB 10.6* 9.5*  ?HCT 37.9* 33.0*  ?MCV 90.2 88.5  ?PLT 202 164  ? ? ? ?Basic Metabolic Panel: ?Recent Labs  ?Lab 01/29/2022 ?1333 01/26/22 ?0434 01/26/22 ?6962  ?NA 137 138  --    ?K 3.4* 3.1*  --   ?CL 96* 98  --   ?CO2 31 30  --   ?GLUCOSE 158* 74  --   ?BUN 30* 20  --   ?CREATININE 4.13* 2.92*  --   ?CALCIUM 8.3* 8.3*  --   ?MG  --   --  1.8  ?PHOS  --  2.9  --   ? ? ?GFR: ?Estimated Creatinine Clearance: 28.4 mL/min (A) (by C-G formula based on SCr of 2.92 mg/dL (H)). ?Recent Labs  ?Lab 02/09/2022 ?1333 01/17/2022 ?1628 01/17/2022 ?1942 01/26/22 ?0434  ?PROCALCITON 0.33  --   --  1.11  ?WBC 12.6*  --   --  10.0  ?LATICACIDVEN  --  0.9 1.2  --   ? ? ? ?Liver Function Tests: ?Recent Labs  ?Lab 02/12/2022 ?1333  ?AST 16  ?ALT 8  ?ALKPHOS 87  ?BILITOT 0.7  ?PROT 7.8  ?ALBUMIN 2.1*  ? ? ?No results for input(s): LIPASE, AMYLASE in the last 168 hours. ?No results for input(s): AMMONIA in the last 168 hours. ? ?ABG ?   ?Component Value Date/Time  ? PHART 7.41 04/13/2017 1455  ? PCO2ART 36 04/13/2017 1455  ? PO2ART 53 (L) 04/13/2017 1455  ? HCO3 35.7 (H) 01/24/2022 1334  ? TCO2 25 09/12/2020 1119  ? ACIDBASEDEF 1.5 04/13/2017 1455  ? O2SAT 69.5 01/28/2022 1334  ? ?  ? ?Coagulation Profile: ?No results for input(s): INR, PROTIME in the last 168 hours. ? ?Cardiac Enzymes: ?No results for input(s): CKTOTAL, CKMB, CKMBINDEX, TROPONINI in the last 168 hours. ? ?HbA1C: ?Hemoglobin A1C  ?Date/Time Value Ref  Range Status  ?07/24/2014 03:24 PM 8.3 (H) 4.2 - 6.3 % Final  ?  Comment:  ?  The American Diabetes Association recommends that a primary goal of ?therapy should be <7% and that physicians should reevaluate the ?treatment regimen in patients with HbA1c values consistently >8%. ?  ? ?Hgb A1c MFr Bld  ?Date/Time Value Ref Range Status  ?07/29/2021 05:02 AM 6.5 (H) 4.8 - 5.6 % Final  ?  Comment:  ?  (NOTE) ?        Prediabetes: 5.7 - 6.4 ?        Diabetes: >6.4 ?        Glycemic control for adults with diabetes: <7.0 ?  ?05/18/2021 05:47 AM 6.0 (H) 4.8 - 5.6 % Final  ?  Comment:  ?  (NOTE) ?Pre diabetes:          5.7%-6.4% ? ?Diabetes:              >6.4% ? ?Glycemic control for   <7.0% ?adults with diabetes ?   ? ? ?CBG: ?Recent Labs  ?Lab 02/04/2022 ?1614 01/24/2022 ?2135 01/26/22 ?0755 01/26/22 ?0221  ?GLUCAP 181* 174* 73 86  ? ? ? ?Review of Systems:   ?Unable to assess due to BiPAP ? ? ?Past Medical History:

## 2022-01-26 NOTE — TOC Initial Note (Signed)
Transition of Care (TOC) - Initial/Assessment Note  ? ? ?Patient Details  ?Name: [name] ?MRN: [id number] ?Date of Birth: 1960/10/24 ? ?Transition of Care (TOC) CM/SW Contact:    ?Shelbie Hutching, RN ?Phone Number: ?01/26/2022, 2:44 PM ? ?Clinical Narrative:                 ?Patient admitted to the hospital with acute respiratory failure, ESRD.  Patient came in from dialysis, he is long term care resident at [facility].  Baseline wheelchair bound on oxygen [place] 2L. ? ?Patient is currently intubated, required intubation for bronchoscopy.  Will get dialysis at the bedside today. ? ?TOC will cont to follow.  ? ?Expected Discharge Plan: [place] ?Barriers to Discharge: Continued Medical Work up ? ? ?Patient Goals and CMS Choice ?Patient states their goals for this hospitalization and ongoing recovery are:: patient currently intubated ?CMS Medicare.gov Compare Post Acute Care list provided to:: Patient ?Choice offered to / list presented to : Patient, Spouse ? ?Expected Discharge Plan and Services ?Expected Discharge Plan: [place] ?  ?Discharge Planning Services: CM Consult ?Post Acute Care Choice: Resumption of Svcs/PTA Provider, [place] ?Living arrangements for the past 2 months: [place] ?                ?DME Arranged: N/A ?DME Agency: NA ?  ?  ?  ?HH Arranged: NA ?[place] Agency: NA ?  ?  ?  ? ?Prior Living Arrangements/Services ?Living arrangements for the past 2 months: [place] ?Lives with:: Facility Resident ?Patient language and need for interpreter reviewed:: Yes ?Do you feel safe going back to the place where you live?: Yes      ?Need for Family Participation in Patient Care: Yes (Comment) (ESRD) ?Care giver support system in place?: Yes (comment) (wife) ?Current home services: DME (wheelchair and oxygen) ?Criminal Activity/Legal Involvement Pertinent to Current Situation/Hospitalization: No - Comment as needed ? ?Activities  of Daily Living ?Home Assistive Devices/Equipment: [employer], Nebulizer, Bathtub lift, Eyeglasses, Oxygen, CBG Meter, Wheelchair ?ADL Screening (condition at time of admission) ?Patient's cognitive ability adequate to safely complete daily activities?: No ?Is the patient deaf or have difficulty hearing?: No ?Does the patient have difficulty seeing, even when wearing glasses/contacts?: Yes ?Does the patient have difficulty concentrating, remembering, or making decisions?: Yes ?Patient able to express need for assistance with ADLs?: Yes ?Does the patient have difficulty dressing or bathing?: Yes ?Independently performs ADLs?: No ?Communication: Dependent ?Is this a change from baseline?: Pre-admission baseline ?Dressing (OT): Dependent ?Is this a change from baseline?: Pre-admission baseline ?Grooming: Dependent ?Is this a change from baseline?: Pre-admission baseline ?Feeding: Dependent ?Is this a change from baseline?: Pre-admission baseline ?Bathing: Dependent ?Is this a change from baseline?: Pre-admission baseline ?Toileting: Dependent ?Is this a change from baseline?: Pre-admission baseline ?In/Out Bed: Dependent ?Is this a change from baseline?: Pre-admission baseline ?Walks in Home: Dependent ?Is this a change from baseline?: Pre-admission baseline ?Does the patient have difficulty walking or climbing stairs?: Yes ?Weakness of Legs: Both ?Weakness of Arms/Hands: Both ? ?Permission Sought/Granted ?Permission sought to share information with : Case Manager, Customer service manager, Family Supports ?Permission granted to share information with : Yes, Verbal Permission Granted ? Share Information with NAME: [name] Tokarski ? Permission granted to share info w AGENCY: [facility] ? Permission granted to share info w Relationship: spouse ? Permission granted to share info w Contact Information: [phone] ? ?Emotional Assessment ?Appearance:: Appears older than  stated age ?Attitude/Demeanor/Rapport:  Intubated (Following Commands or Not Following Commands) ?Affect (typically observed): Unable to Assess ?Orientation: : Oriented to Self ?Alcohol / Substance Use: Not Applicable ?Psych Involvement: No (comment) ? ?Admission diagnosis:  ESRD (end stage renal disease) ([place]) [N18.6] ?Acute respiratory failure with hypoxia ([place]) [J96.01] ?Unresponsiveness [R41.89] ?Patient Active Problem List  ? Diagnosis Date Noted  ? Unresponsiveness 01/23/2022  ? Mucus plugging of bronchi 01/18/2022  ? HLD (hyperlipidemia) 01/17/2022  ? Elevated troponin 01/28/2022  ? Multifocal pneumonia   ? Type 2 diabetes mellitus with diabetic neuropathy, without long-term current use of insulin ([place]) 07/27/2021  ? Anemia in ESRD (end-stage renal disease) ([place]) 07/27/2021  ? Hypoglycemia 07/27/2021  ? Hypothermia 07/27/2021  ? COVID-19 virus infection 07/27/2021  ? Aspiration pneumonia ([place]) 07/27/2021  ? Unstageable pressure ulcer of sacral region ([place]) 05/22/2021  ? Acute decompensated heart failure ([place]) 05/19/2021  ? Acute respiratory failure with hypoxia ([place]) 05/18/2021  ? Protein-calorie malnutrition, severe 05/14/2021  ? Acute pulmonary edema (HCC)   ? Shortness of breath   ? Fluid overload 05/12/2021  ? Cellulitis and abscess of buttock   ? History of GI bleed 02/06/2021  ? Long term current use of immunosuppressive drug 02/06/2021  ? Disorder of skin due to Crohn's disease ([place]) 02/06/2021  ? Complication of vascular access for dialysis 01/22/2021  ? Polyp of transverse colon   ? Acute metabolic encephalopathy 16/07/9603  ? Acute on chronic anemia 11/18/2020  ? Chronic systolic CHF (congestive heart failure) ([place]) 11/18/2020  ? Cerebrovascular disease 09/12/2020  ? Transient alteration of awareness 09/12/2020  ? Hyperkalemia 09/12/2020  ? Mild cognitive impairment 09/30/2019  ? Peripheral vascular disease ([place]) 06/28/2018  ? Sepsis ([place]) 01/12/2018  ? Pressure injury of skin 12/04/2017  ? S/P bilateral BKA (below knee amputation) ([place])  10/20/2017  ? Atherosclerosis of native arteries of extremity with rest pain ([place]) 08/05/2017  ? History of CVA (cerebrovascular accident) 04/15/2017  ? Seizure disorder ([place]) 04/15/2017  ? ESRD (end stage renal disease) ([place]) 04/12/2016  ? Aphthae 02/20/2016  ? Hidradenitis suppurativa 02/20/2016  ? Leg pain 02/20/2016  ? Neuropathy 02/20/2016  ? Narrowing of intervertebral disc space 08/29/2015  ? Vascular disorder of lower extremity 08/29/2015  ? Failure of erection 08/29/2015  ? Hyperlipidemia 08/29/2015  ? Essential hypertension 08/29/2015  ? Anemia due to chronic kidney disease 05/26/2015  ? Venous insufficiency of leg 09/04/2014  ? History of deep vein thrombosis (DVT) of lower extremity 08/16/2014  ? Prostatic intraepithelial neoplasia 11/02/2013  ? Elevated prostate specific antigen (PSA) 09/11/2013  ? Benign prostatic hyperplasia with urinary obstruction 08/13/2013  ? Spermatocele 08/13/2013  ? Avitaminosis D 01/25/2013  ? Type 2 diabetes mellitus with hypoglycemia without coma ([place]) 03/28/2012  ? Crohn disease ([place]) 08/03/2011  ? ?PCP:  [name], MD ?Pharmacy:   ?[facility] [place], [address] AT [facility] [address] ?[place] ?[place] [address] ?Phone: [phone] Fax: [phone] ? ?[place] ?[place] [address] ?Phone: [phone] Fax: [phone] ? ? ? ? ?Social Determinants of Health (SDOH) Interventions ?  ? ?Readmission Risk Interventions ? ?  11/09/2021  ?  3:37 PM 07/28/2021  ?  2:32 PM 02/10/2021  ?  9:31 AM  ?Readmission Risk Prevention Plan  ?Transportation Screening Complete Complete Complete  ?[place] or Home Care Consult   Complete  ?Palliative  Care Screening   Complete  ?Medication Review Press photographer) Complete Complete Complete  ?PCP or Specialist appointment within 3-5 days of discharge  Complete   ?Palliative Care Screening Not Applicable Not  Applicable   ?Skilled Nursing Facility Complete Not Applicable   ? ? ? ?

## 2022-01-26 NOTE — Consult Note (Signed)
? ?                                                                                ?Consultation Note ?Date: 01/26/2022  ? ?Patient Name: [name]  ?DOB: Feb 04, 1961  MRN: [id number]  Age / Sex: 61 y.o., male  ?PCP: [name], MD ?Referring Physician: Ottie Glazier, MD ? ?Reason for Consultation: Establishing goals of care ? ?HPI/Patient Profile:  [name] is a 61 y.o. male with medical history significant of ESRD-HD (MWF), hidradenitis suppurativa on [name], hypertension, hyperlipidemia, diabetes mellitus, stroke, peritonitis, ICH, DVT, s/p for bilateral BKA, Crohn's disease, anemia, seizure, PVD, mild cognitive impairment, sCHF with EF 30-35%, BPH, hidradenitis suppurativa on Cosentyx injection, presents with unresponsiveness. ? ?Clinical Assessment and Goals of Care: ?Notes and labs reviewed. Patient is resting in bed on dialysis. He has mittens in place. His wife is at bedside. She tells me they have been together over 40 years. She tells me thye have 2 children. ? ?She discusses his decline since August. She discusses his drastic weight loss. She tells me he suffers and he is tired. She states he wanted to stop dialysis a couple years ago, but the home health person described weeping edema that would "get the couch dirty", and shortness of breath from filling up with fluid. She states her husband never mentioned stopping dialysis again.  ? ?We discussed his diagnoses, prognosis, GOC, EOL wishes disposition and options. ? ?Created space and opportunity for patient  to explore thoughts and feelings regarding current medical information.  ? ?A detailed discussion was had today regarding advanced directives.  Concepts specific to code status, artifical feeding and hydration, IV antibiotics and rehospitalization were discussed.  The difference between an aggressive medical intervention path and a comfort care path was discussed.  Values and goals of care important to patient and family were  attempted to be elicited. ? ?Discussed limitations of medical interventions to prolong quality of life in some situations and discussed the concept of human mortality. She states they are people of faith, and know this world is not the end.  ? ?She states her daughter is in favor of comfort focused care, but she just is not there yet. She states she is not ready to let him go. She states she prays to God.  We discussed God's sovereignty and his will vs ours. She questions why he is suffering. She understands what is being done to keep him here on earth; we discussed what stopping dialysis would look like. As we talk he is attempting to pull his tube. He attempts to get her attention and nod his head as we talk. When she asked if he is listening he nodded his head. She understands prognosis is very poor. She states she wants to have a conversation with him and will allow him to make decisions and give him permission to proceed with care on his terms.  ?  ? ?SUMMARY OF RECOMMENDATIONS   ?Wife is considering care moving forward. I will follow up on Thursday on my return.  ? ? ? ?  ? ?Primary Diagnoses: ?Present on Admission: ? Unresponsiveness ? Mucus plugging of bronchi ?  Chronic systolic CHF (congestive heart failure) ([place]) ? ESRD (end stage renal disease) ([place]) ? Essential hypertension ? Crohn disease ([place]) ? Acute respiratory failure with hypoxia ([place]) ? HLD (hyperlipidemia) ? Elevated troponin ? Anemia in ESRD (end-stage renal disease) ([place]) ? Hidradenitis suppurativa ? ? ?I have reviewed the medical record, interviewed the patient and family, and examined the patient. The following aspects are pertinent. ? ?Past Medical History:  ?Diagnosis Date  ? Acute metabolic encephalopathy 56/31/4970  ? Anemia   ? Cardiomyopathy ([place])   ? a. 08/2020 Echo: EF 40-45%, glbo HK, GrII DD; b. 04/2021 Echo: EF 30-35%, glob HK, mild LVH, GrII DD.  ? Coronary artery calcification seen on CT scan   ? Crohn disease ([place])   ? Diabetes  mellitus without complication ([place])   ? DVT of lower extremity (deep venous thrombosis) ([place]) 2016  ? Empyema ([place]) 05/20/2017  ? Encephalopathy 12/04/2017  ? ESRD (end stage renal disease) on hemodyalisis ([place])   ? Fall at home, initial encounter 09/12/2020  ? HFrEF (heart failure with reduced ejection fraction) ([place])   ? a. 08/2020 Echo: EF 40-45%, glbo HK, mod conc LVH, GrII DD, Ao root 62m; b. 04/2021 Echo: EF 30-35%, glob HK, mild LVH, GrII DD, mod red RV fxn, nl PASP, sev dil LA, mild-mod MR, mod TR, mild-mod AoV sclerosis w/o stenosis.  ? Hidradenitis suppurativa   ? Hypertension   ? ICH (intracerebral hemorrhage) ([place]   ? Peritonitis ([place] 04/21/2017  ? Pyogenic arthritis of knee ([place] 02/04/2016  ? Sepsis ([place] 01/12/2018  ? Stroke ([facility]   ? ?Social History  ? ?Socioeconomic History  ? Marital status: Married  ?  Spouse name: Not on file  ? Number of children: 2  ? Years of education: Not on file  ? Highest education level: Associate degree: occupational, tHotel manager or vocational program  ?Occupational History  ? Occupation: disable  ?Tobacco Use  ? Smoking status: Former  ? Smokeless tobacco: Never  ? Tobacco comments:  ?  smokes marijuana  ?Vaping Use  ? Vaping Use: Never used  ?Substance and Sexual Activity  ? Alcohol use: No  ? Drug use: Yes  ?  Types: Marijuana  ? Sexual activity: Not Currently  ?Other Topics Concern  ? Not on file  ?Social History Narrative  ? Not on file  ? ?Social Determinants of Health  ? ?Financial Resource Strain: Low Risk   ? Difficulty of Paying Living Expenses: Not hard at all  ?Food Insecurity: No Food Insecurity  ? Worried About RCharity fundraiserin the Last Year: Never true  ? Ran Out of Food in the Last Year: Never true  ?Transportation Needs: No Transportation Needs  ? Lack of Transportation (Medical): No  ? Lack of Transportation (Non-Medical): No  ?Physical Activity: Inactive  ? Days of Exercise per Week: 0 days  ? Minutes of Exercise per Session: 0 min  ?Stress: No  Stress Concern Present  ? Feeling of Stress : Not at all  ?Social Connections: Moderately Isolated  ? Frequency of Communication with Friends and Family: Twice a week  ? Frequency of Social Gatherings with Friends and Family: More than three times a week  ? Attends Religious Services: Never  ? Active Member of Clubs or Organizations: No  ? Attends CArchivistMeetings: Never  ? Marital Status: Married  ? ?Family History  ?Problem Relation Age of Onset  ? Irritable bowel syndrome Sister   ? Diabetes Sister   ?  Heart disease Mother   ? Diabetes Mother   ? Heart disease Father   ? Rheumatic fever Father   ?     as child  ? Psoriasis Brother   ? Arthritis Brother   ? Diabetes Sister   ? Diabetes Sister   ? ?Scheduled Meds: ? amLODipine  10 mg Oral QPM  ? vitamin C  500 mg Oral BID  ? atorvastatin  80 mg Oral QHS  ? carvedilol  25 mg Oral BID  ? Chlorhexidine Gluconate Cloth  6 each Topical Q0600  ? Chlorhexidine Gluconate Cloth  6 each Topical Q0600  ? cloNIDine  0.1 mg Oral BID  ? docusate  100 mg Per Tube BID  ? [START ON 01/27/2022] feeding supplement  237 mL Oral TID BM  ? fentaNYL (SUBLIMAZE) injection  50 mcg Intravenous Once  ? gabapentin  100 mg Oral QHS  ? heparin  5,000 Units Subcutaneous Q8H  ? hydrALAZINE  100 mg Oral Q8H  ? HYDROcodone-acetaminophen  1 tablet Oral Daily  ? insulin aspart  0-5 Units Subcutaneous QHS  ? insulin aspart  0-9 Units Subcutaneous TID WC  ? ipratropium-albuterol  3 mL Nebulization Q4H  ? isosorbide mononitrate  30 mg Oral Daily  ? levETIRAcetam  750 mg Oral Q1200  ? lisinopril  40 mg Oral Daily  ? multivitamin with minerals  1 tablet Oral Daily  ? polyethylene glycol  17 g Per Tube Daily  ? sevelamer carbonate  800 mg Oral 3 times per day on Sun Tue Thu Sat  ? sevelamer carbonate  800 mg Oral 2 times per day on Mon Wed Fri  ? ?Continuous Infusions: ? sodium chloride    ? sodium chloride    ? ampicillin-sulbactam (UNASYN) IV    ? fentaNYL infusion INTRAVENOUS 100 mcg/hr  (01/26/22 1147)  ? levETIRAcetam    ? ?PRN Meds:.sodium chloride, sodium chloride, acetaminophen, albuterol, alteplase, dextromethorphan-guaiFENesin, fentaNYL, heparin, hydrALAZINE, lidocaine (PF), lidocaine-p

## 2022-01-26 NOTE — Progress Notes (Signed)
?[facility]  ?ROUNDING NOTE  ? ?Subjective:  ? ?[name] is a 61 year old male with past medical history including seizures, anemia, bilateral BKA, hypertension, hyperlipidemia, mild cognitive impairment, CHF with EF 30 to 35%, BPH, hydradenitis suppurativa, and end-stage renal disease on hemodialysis.  Patient presents to the emergency department after being found unresponsive at dialysis clinic.  Patient has been admitted for ESRD (end stage renal disease) ([place]) [N18.6] ?Acute respiratory failure with hypoxia ([place]) [J96.01] ?Unresponsiveness [R41.89] ? ?Patient is known to our practice and receives outpatient dialysis treatments at [facility] on a Monday Wednesday Friday schedule, supervised by [facility] physicians.  Patient became unresponsive at outpatient dialysis center after starting dialysis treatment.  Reports statement patient did not follow will suffer head injury during this event.  Patient unable to recall event, chart review conducted.  Report states no seizure activity reported.  Patient with oxygen saturations initially in the 70s.  Patient placed on nonrebreather and eventually BiPAP on arrival to ED.  During emergency department visit, patient's mentation recovered and was able to answer simple questions and move all extremities. ? ?Labs on ED arrival include potassium 3.4, glucose 158, BUN 30, creatinine 4.13 with GFR 16, albumin 2.1.  Troponin 19 and BNP greater than 4500.  WBC elevated 12.6.Respiratory panel negative for influenza and covid 19. Chest X-ray show right lung complete opacification presumably secondary to mucous plug.  Follow-up chest x-ray showed right hemithorax.  With mediastinal shift to the right. ? ?We have been consulted to manage dialysis during this admission. ? ? ?Objective:  ?Vital signs in last 24 hours:  ?Temp:  [97.7 ?F (36.5 ?C)-98 ?F (36.7 ?C)] 97.8 ?F (36.6 ?C) (04/11 0700) ?Pulse Rate:  [57-83] 72 (04/11 0809) ?Resp:  [13-31] 18 (04/11  0809) ?BP: (126-164)/(48-76) 129/51 (04/11 0800) ?SpO2:  [79 %-99 %] 96 % (04/11 0809) ?FiO2 (%):  [35 %-60 %] 35 % (04/10 1923) ?Weight:  [73.8 kg-75.3 kg] 73.8 kg (04/11 0115) ? ?Weight change:  ?Filed Weights  ? 02/01/2022 1338 02/14/2022 2246 01/26/22 0115  ?Weight: 75.3 kg 75.3 kg 73.8 kg  ? ? ?Intake/Output: ?I/O last 3 completed shifts: ?In: 350 [IV Piggyback:350] ?Out: 1500 [Other:1500] ?  ?Intake/Output this shift: ? No intake/output data recorded. ? ?Physical Exam: ?General: NAD  ?Head: Normocephalic, atraumatic. Moist oral mucosal membranes  ?Eyes: Anicteric  ?Lungs:  Left-sided rhonchi, diminished right, normal effort, [place] O2  ?Heart: Regular rate and rhythm  ?Abdomen:  Soft, nontender, bowel sounds present  ?Extremities:  No peripheral edema.  ?Neurologic: Nonfocal, moving all four extremities  ?Skin: No lesions  ?Access: Left aVF  ? ? ?Basic Metabolic Panel: ?Recent Labs  ?Lab 01/19/2022 ?1333 01/26/22 ?0434 01/26/22 ?0865  ?NA 137 138  --   ?K 3.4* 3.1*  --   ?CL 96* 98  --   ?CO2 31 30  --   ?GLUCOSE 158* 74  --   ?BUN 30* 20  --   ?CREATININE 4.13* 2.92*  --   ?CALCIUM 8.3* 8.3*  --   ?MG  --   --  1.8  ?PHOS  --  2.9  --   ? ? ?Liver Function Tests: ?Recent Labs  ?Lab 01/24/2022 ?1333  ?AST 16  ?ALT 8  ?ALKPHOS 87  ?BILITOT 0.7  ?PROT 7.8  ?ALBUMIN 2.1*  ? ?No results for input(s): LIPASE, AMYLASE in the last 168 hours. ?No results for input(s): AMMONIA in the last 168 hours. ? ?CBC: ?Recent Labs  ?Lab 01/20/2022 ?[facility]  01/26/22 ?0434  ?WBC 12.6* 10.0  ?NEUTROABS 10.0*  --   ?HGB 10.6* 9.5*  ?HCT 37.9* 33.0*  ?MCV 90.2 88.5  ?PLT 202 164  ? ? ?Cardiac Enzymes: ?No results for input(s): CKTOTAL, CKMB, CKMBINDEX, TROPONINI in the last 168 hours. ? ?BNP: ?Invalid input(s): POCBNP ? ?CBG: ?Recent Labs  ?Lab 01/31/2022 ?1614 02/08/2022 ?2135 01/26/22 ?0755 01/26/22 ?6333  ?GLUCAP 181* 174* 73 86  ? ? ?Microbiology: ?Results for orders placed or performed during the hospital encounter of 01/21/2022  ?Resp Panel by RT-PCR  (Flu A&B, Covid) Nasopharyngeal Swab     Status: None  ? Collection Time: 01/26/2022  1:35 PM  ? Specimen: Nasopharyngeal Swab; Nasopharyngeal(NP) swabs in vial transport medium  ?Result Value Ref Range Status  ? SARS Coronavirus 2 by RT PCR NEGATIVE NEGATIVE Final  ?  Comment: (NOTE) ?SARS-CoV-2 target nucleic acids are NOT DETECTED. ? ?The SARS-CoV-2 RNA is generally detectable in upper respiratory ?specimens during the acute phase of infection. The lowest ?concentration of SARS-CoV-2 viral copies this assay can detect is ?138 copies/mL. A negative result does not preclude SARS-Cov-2 ?infection and should not be used as the sole basis for treatment or ?other patient management decisions. A negative result may occur with  ?improper specimen collection/handling, submission of specimen other ?than nasopharyngeal swab, presence of viral mutation(s) within the ?areas targeted by this assay, and inadequate number of viral ?copies(<138 copies/mL). A negative result must be combined with ?clinical observations, patient history, and epidemiological ?information. The expected result is Negative. ? ?Fact Sheet for Patients:  ?EntrepreneurPulse.com.au ? ?Fact Sheet for Healthcare Providers:  ?IncredibleEmployment.be ? ?This test is no t yet approved or cleared by the [place] FDA and  ?has been authorized for detection and/or diagnosis of SARS-CoV-2 by ?FDA under an Emergency Use Authorization (EUA). This EUA will remain  ?in effect (meaning this test can be used) for the duration of the ?COVID-19 declaration under Section 564(b)(1) of the Act, 21 ?U.S.C.section 360bbb-3(b)(1), unless the authorization is terminated  ?or revoked sooner.  ? ? ?  ? Influenza A by PCR NEGATIVE NEGATIVE Final  ? Influenza B by PCR NEGATIVE NEGATIVE Final  ?  Comment: (NOTE) ?The Xpert Xpress SARS-CoV-2/FLU/RSV plus assay is intended as an aid ?in the diagnosis of influenza from Nasopharyngeal swab specimens  and ?should not be used as a sole basis for treatment. Nasal washings and ?aspirates are unacceptable for Xpert Xpress SARS-CoV-2/FLU/RSV ?testing. ? ?Fact Sheet for Patients: ?EntrepreneurPulse.com.au ? ?Fact Sheet for Healthcare Providers: ?IncredibleEmployment.be ? ?This test is not yet approved or cleared by the [place] FDA and ?has been authorized for detection and/or diagnosis of SARS-CoV-2 by ?FDA under an Emergency Use Authorization (EUA). This EUA will remain ?in effect (meaning this test can be used) for the duration of the ?COVID-19 declaration under Section 564(b)(1) of the Act, 21 U.S.C. ?section 360bbb-3(b)(1), unless the authorization is terminated or ?revoked. ? ?Performed at [facility], [place], ?[place] [address] ?  ?Culture, blood (routine x 2) Call MD if unable to obtain prior to antibiotics being given     Status: None (Preliminary result)  ? Collection Time: 01/23/2022  4:29 PM  ? Specimen: BLOOD  ?Result Value Ref Range Status  ? Specimen Description BLOOD BLOOD RIGHT HAND  Final  ? Special Requests   Final  ?  BOTTLES DRAWN AEROBIC AND ANAEROBIC Blood Culture adequate volume  ? Culture   Final  ?  NO GROWTH < 12 HOURS ?Performed  at [facility], [address] [place], [place] [address] ?  ? Report Status PENDING  Incomplete  ?Culture, blood (routine x 2) Call MD if unable to obtain prior to antibiotics being given     Status: None (Preliminary result)  ? Collection Time: 01/22/2022  5:54 PM  ? Specimen: BLOOD  ?Result Value Ref Range Status  ? Specimen Description BLOOD BLOOD RIGHT HAND  Final  ? Special Requests   Final  ?  BOTTLES DRAWN AEROBIC AND ANAEROBIC Blood Culture adequate volume  ? Culture   Final  ?  NO GROWTH < 12 HOURS ?Performed at [facility], [address], [place], [place] [address] ?  ? Report Status PENDING  Incomplete  ?MRSA Next Gen by PCR, Nasal     Status: None  ? Collection Time:  01/17/2022  6:03 PM  ? Specimen: Nasal Mucosa; Nasal Swab  ?Result Value Ref Range Status  ? MRSA by PCR Next Gen NOT DETECTED NOT DETECTED Final  ?  Comment: (NOTE) ?The GeneXpert MRSA Assay (FDA approved fo

## 2022-01-26 NOTE — Progress Notes (Addendum)
Pharmacy Antibiotic Note ? ?[name] is a 61 y.o. male admitted on 02/13/2022 with pneumonia. PMH includes ESRD on Hemodialysis MWF, HFrEF (LVEF 30-35%), Hypertension, Hyperlipidemia, Peripheral Vascular Disease s/p bilateral BKA's, T2DM, Stroke, ICH, seizures, Crohn's Disease, BPH, Hidradenitis Suppurativa on Cosentyx. Pharmacy has been consulted for Unasyn dosing. ? ?Presented with becoming unresponsive while at dialysis with oxygen desaturation after 15 minutes of HD session. Previously noncompliant with HD sessions. Now admitted with volume overload and concern for possible pneumonia. WBC slightly elevated. Today 4/11 WBC is within normal limits. Remains afebrile. ? ?Plan: ?Ampicillin/sulbactam 3 grams every 24 hours ? ?Monitor HD plans, clinical improvement (respiratory status, wbc, fevers), and duration of therapy. ? ?Height: 5' 11"  (180.3 cm) ?Weight: 73.8 kg (162 lb 11.2 oz) ?IBW/kg (Calculated) : 75.3 ? ?Temp (24hrs), Avg:97.8 ?F (36.6 ?C), Min:97.7 ?F (36.5 ?C), Max:98 ?F (36.7 ?C) ? ?Recent Labs  ?Lab 01/22/2022 ?1333 01/29/2022 ?1628 01/24/2022 ?1942 01/26/22 ?0434  ?WBC 12.6*  --   --  10.0  ?CREATININE 4.13*  --   --  2.92*  ?LATICACIDVEN  --  0.9 1.2  --   ?  ?Estimated Creatinine Clearance: 28.4 mL/min (A) (by C-G formula based on SCr of 2.92 mg/dL (H)).   ? ?Allergies  ?Allergen Reactions  ? Methotrexate Other (See Comments)  ?  Blood count drops  ? Vancomycin Shortness Of Breath  ?  Eyes watering, SOB, wheezing  ? Cefepime Other (See Comments)  ?  Confusion   ? Tape   ? ? ?Antimicrobials this admission: ?4/10 azithromycin >>  ?4/10 ceftriaxone >>  ?4/11 ampicillin/sulbactam ? ?Dose adjustments this admission: ?N/a ? ?Microbiology results: ?4/10 BCx: No growth < 12 hours ?4/10 MRSA PCR: Not detected ? ?Thank you for allowing pharmacy to be a part of this patient?s care. ? ?Wynelle Cleveland, PharmD ?Pharmacy Resident  ?01/26/2022 ?8:47 AM ? ?

## 2022-01-26 NOTE — Progress Notes (Addendum)
Initial Nutrition Assessment ? ?DOCUMENTATION CODES:  ? ?Severe malnutrition in context of chronic illness ? ?INTERVENTION:  ? ?Ensure Enlive po TID, each supplement provides 350 kcal and 20 grams of protein. ? ?MVI po daily  ? ?Vitamin C 519m po BID ? ?Recommend liberal diet  ? ?Pt at high refeed risk; recommend monitor potassium, magnesium and phosphorus labs daily until stable ? ?NUTRITION DIAGNOSIS:  ? ?Severe Malnutrition related to chronic illness (ESRD on HD, DM, Crohn's disease, CHF, CVA) as evidenced by severe fat depletion, severe muscle depletion. ? ?GOAL:  ? ?Patient will meet greater than or equal to 90% of their needs ? ?MONITOR:  ? ?Diet advancement, Labs, Weight trends, Skin, I & O's ? ?REASON FOR ASSESSMENT:  ? ?Consult ?Assessment of nutrition requirement/status ? ?ASSESSMENT:  ? ?61y/o male with h/o DM, bilateral BKAs, CHF, ESRD on HD, BPH, anemia, perianal Crohn's disease/hidradenitis suppurativa with perianal fistulas (with remicade infusions and azothiaprine), ICH, seizures, PVD, COVID 19 (07/2021), DVT, peritonitis, HTN, cardiomyopathy and recent admission for sepsis, hypoglcemia, PNA and cellulitis of buttocks/sacrum who is now admitted with mucus plug and volume overload. ? ?Met with pt and pts' wife in room today. Pt is well known to this RD from numerous previous admissions. Pt reports poor appetite and oral intake at baseline. [facility] wife reports that pt usually eats good for breakfast but then mainly snacks on chips and cookies during the day. Pt does drink chocolate Ensure. [facility]. RD will add supplements and vitamins to help pt meet his estimated needs and to support wound healing. Per chart, pt appears to be down ~19lbs(13%) over the past year. Pt currently up ~35lbs from his UBW. Plan today is for bronchoscopy and HD.  ? ?Medications reviewed and include:  colace, heparin, insulin, MVI, miralax, renvela, unasyn ? ?Labs reviewed: K 3.1(L), creat 2.92(H), P 2.9 wnl, Mg 1.8 wnl ?BNP >4500- 4/10 ?Hgb 9.5(L), Hct 33.0(L) ?Cbgs- 78, 86, 73 x 24 hrs ?AIC 6.5(H)- 10/22 ? ?NUTRITION - FOCUSED PHYSICAL EXAM: ? ?Flowsheet Row Most Recent Value  ?Orbital Region Severe depletion  ?Upper Arm Region Severe depletion  ?Thoracic and Lumbar Region Severe depletion  ?Buccal Region Severe depletion  ?Temple Region Severe depletion  ?Clavicle Bone Region Severe depletion  ?Clavicle and Acromion Bone Region Severe depletion  ?Scapular Bone Region Severe depletion  ?Dorsal Hand Severe depletion  ?Patellar Region Unable to assess  ?Anterior Thigh Region Unable to assess  ?Posterior Calf Region Unable to assess  ?Edema (RD Assessment) None  ?Hair Reviewed  ?Eyes Reviewed  ?Mouth Reviewed  ?Skin Reviewed  ?Nails Reviewed  ? ?Diet Order:   ?Diet Order   ? ?       ?  Diet NPO time specified  Diet effective now       ?  ? ?  ?  ? ?  ? ?EDUCATION NEEDS:  ? ?Education needs have been addressed ? ?Skin:  Skin Assessment: Reviewed RN Assessment (Wounds to buttocks and sacral area related to hidradenitis suppurativa.) ? ?Last BM:  4/10- type 6 ? ?Height:  ? ?Ht Readings from Last 1 Encounters:  ?01/27/2022 _0  (1.803 m)  ? ? ?Weight:  ? ?Wt Readings from Last 1 Encounters:  ?01/26/22 73.8 kg  ? ?BMI:  Body mass index is 22.69 kg/m?. ? ?Estimated Nutritional Needs:  ? ?Kcal:  1900-2200kcal/day ? ?  Protein:  95-110g/day ? ?Fluid:  1.8-2.1L/day ? ?Koleen Distance MS, RD, LDN ?Please refer to [facility] for RD and/or RD on-call/weekend/after hours pager ? ?

## 2022-01-26 NOTE — Progress Notes (Signed)
Hemodialysis Post Treatment Note ? ?25 January 2022 ? ?Access: LUA AVF ? ?UF Removed: 1.5 liters ? ?Duration of Treatment: 2 hours ? ?Next Scheduled Treatment: TBD ? ?Unscheduled patient completes urgent treatment having been seen in the ED for a syncopal episode from his chronic outpatient dialysis center. Patient is on Bi-Pap responses to verbal questioning. LUA AVF cannulates with ease, needle from earlier treatment present. Targeted UF of 1500 ml met, without adverse reaction. Patient remained stable throughout session, becoming less confused as session progressed. VSS, Bi-PAP in place, report given to primary nurse. Note that labs ordered were drawn and sent to lab.  ?

## 2022-01-26 NOTE — Progress Notes (Addendum)
0730 Changed to 4L nasal canula from BiPAP. Patient ask to be moved. When moved as requested he yelled at nurse. States he is in pain. After ice chips and a few sips of water given prn Tramadol for pain. 0800 Repositioned as requested again. 1130 Patient sedated for intubation and bronchoscopy.1145 Tolerated intubation well.67 Tolerated bronchoscopy well. Attempted to wean patient to 35% FIO2. Tolerated weaning of oxygen but not spontaneous mode on ventilator x 2. Patient was unable to pull appropriate lung volumes to extubate. Remains on ventilator at 35%. ?

## 2022-01-26 NOTE — Procedures (Signed)
Endotracheal Intubation: ?Patient required placement of an artificial airway to perform bronchscopy  ?  ?Consent: Emergent.  ?  ?Hand washing performed prior to starting the procedure.  ?  ?Medications administered for sedation prior to procedure:  ?Fentanyl 100 mcg IV, 20 mg Etomidate IV, 2 mg Versed IV, Rocuronium 70 mg IV ?  ?  ?A time out procedure was called and correct patient, name, & ID confirmed. Needed supplies and equipment were assembled and checked to include ETT, 10 ml syringe, Glidescope, Mac and Miller blades, suction, oxygen and bag mask valve, end tidal CO2 monitor.  ?  ?Patient was positioned to align the mouth and pharynx to facilitate visualization of the glottis.  ?  ?Heart rate, SpO2 and blood pressure was continuously monitored during the procedure. Pre-oxygenation was conducted prior to intubation and endotracheal tube was placed through the vocal cords into the trachea.  ?  ?  ? The artificial airway was placed under direct visualization via glidescope route using a 8 cm   ETT on the first attempt. ?  ?ETT was secured at 24 cm   . ?  ?Placement was confirmed by auscuitation of lungs with good breath sounds bilaterally and no stomach sounds.  Condensation was noted on endotracheal tube.   ?Pulse ox 95%   CO2 detector in place with appropriate color change.  ?  ?Complications: None .  ?  ?  ?  ?Chest radiograph ordered and pending.  ? ?Donell Beers, AGNP  ?Pulmonary/Critical Care ?Pager [phone] (please enter 7 digits) ?PCCM Consult Pager [phone] (please enter 7 digits)  ?

## 2022-01-27 ENCOUNTER — Inpatient Hospital Stay: Payer: Medicare Other

## 2022-01-27 LAB — GLUCOSE, CAPILLARY
Glucose-Capillary: 104 mg/dL — ABNORMAL HIGH (ref 70–99)
Glucose-Capillary: 56 mg/dL — ABNORMAL LOW (ref 70–99)
Glucose-Capillary: 60 mg/dL — ABNORMAL LOW (ref 70–99)
Glucose-Capillary: 64 mg/dL — ABNORMAL LOW (ref 70–99)
Glucose-Capillary: 71 mg/dL (ref 70–99)
Glucose-Capillary: 71 mg/dL (ref 70–99)
Glucose-Capillary: 74 mg/dL (ref 70–99)
Glucose-Capillary: 75 mg/dL (ref 70–99)
Glucose-Capillary: 86 mg/dL (ref 70–99)
Glucose-Capillary: 94 mg/dL (ref 70–99)

## 2022-01-27 LAB — CBC
HCT: 31 % — ABNORMAL LOW (ref 39.0–52.0)
Hemoglobin: 8.7 g/dL — ABNORMAL LOW (ref 13.0–17.0)
MCH: 24.9 pg — ABNORMAL LOW (ref 26.0–34.0)
MCHC: 28.1 g/dL — ABNORMAL LOW (ref 30.0–36.0)
MCV: 88.6 fL (ref 80.0–100.0)
Platelets: 113 10*3/uL — ABNORMAL LOW (ref 150–400)
RBC: 3.5 MIL/uL — ABNORMAL LOW (ref 4.22–5.81)
RDW: 21.6 % — ABNORMAL HIGH (ref 11.5–15.5)
WBC: 7.8 10*3/uL (ref 4.0–10.5)
nRBC: 0 % (ref 0.0–0.2)

## 2022-01-27 LAB — RENAL FUNCTION PANEL
Albumin: 1.7 g/dL — ABNORMAL LOW (ref 3.5–5.0)
Anion gap: 9 (ref 5–15)
BUN: 16 mg/dL (ref 6–20)
CO2: 27 mmol/L (ref 22–32)
Calcium: 8 mg/dL — ABNORMAL LOW (ref 8.9–10.3)
Chloride: 99 mmol/L (ref 98–111)
Creatinine, Ser: 2.47 mg/dL — ABNORMAL HIGH (ref 0.61–1.24)
GFR, Estimated: 29 mL/min — ABNORMAL LOW (ref >60)
Glucose, Bld: 72 mg/dL (ref 70–99)
Phosphorus: 2.9 mg/dL (ref 2.5–4.6)
Potassium: 3.2 mmol/L — ABNORMAL LOW (ref 3.5–5.1)
Sodium: 135 mmol/L (ref 135–145)

## 2022-01-27 LAB — PROCALCITONIN: Procalcitonin: 1.61 ng/mL

## 2022-01-27 LAB — HEPATITIS B SURFACE ANTIBODY, QUANTITATIVE: Hep B S AB Quant (Post): 15.2 m[IU]/mL (ref >9.9)

## 2022-01-27 LAB — PARATHYROID HORMONE, INTACT (NO CA): PTH: 67 pg/mL — ABNORMAL HIGH (ref 15–65)

## 2022-01-27 MED ORDER — LEVETIRACETAM 100 MG/ML PO SOLN
750.0000 mg | Freq: Every day | ORAL | Status: DC
  Administered 2022-01-27 – 2022-01-28 (×2): 750 mg
  Filled 2022-01-27 (×2): qty 7.5

## 2022-01-27 MED ORDER — ORAL CARE MOUTH RINSE
15.0000 mL | Freq: Two times a day (BID) | OROMUCOSAL | Status: DC
  Administered 2022-01-28 – 2022-02-02 (×7): 15 mL via OROMUCOSAL

## 2022-01-27 MED ORDER — FENTANYL CITRATE PF 50 MCG/ML IJ SOSY
25.0000 ug | PREFILLED_SYRINGE | INTRAMUSCULAR | Status: DC | PRN
Start: 2022-01-27 — End: 2022-01-31
  Administered 2022-01-27 – 2022-01-30 (×3): 25 ug via INTRAVENOUS
  Filled 2022-01-27 (×3): qty 1

## 2022-01-27 MED ORDER — CHLORHEXIDINE GLUCONATE 0.12 % MT SOLN
15.0000 mL | Freq: Two times a day (BID) | OROMUCOSAL | Status: DC
  Administered 2022-01-27 – 2022-02-02 (×8): 15 mL via OROMUCOSAL
  Filled 2022-01-27 (×12): qty 15

## 2022-01-27 MED ORDER — CHLORHEXIDINE GLUCONATE 0.12% ORAL RINSE (MEDLINE KIT)
15.0000 mL | Freq: Two times a day (BID) | OROMUCOSAL | Status: DC
  Administered 2022-01-27: 15 mL via OROMUCOSAL

## 2022-01-27 MED ORDER — ORAL CARE MOUTH RINSE
15.0000 mL | OROMUCOSAL | Status: DC
  Administered 2022-01-27 (×4): 15 mL via OROMUCOSAL

## 2022-01-27 MED ORDER — DEXTROSE 50 % IV SOLN
12.5000 g | INTRAVENOUS | Status: AC
  Administered 2022-01-27: 12.5 g via INTRAVENOUS
  Filled 2022-01-27: qty 50

## 2022-01-27 NOTE — Progress Notes (Signed)
Pressure dressing applied under bipap mask by RN.  Patient tolerating well.  ?

## 2022-01-27 NOTE — Progress Notes (Signed)
PSV wean attempted 10/5 30%, pt failed twice with Tidal Volume <236m, O2 sat <83%, and increased agitation ?

## 2022-01-27 NOTE — Progress Notes (Signed)
Pt extubated to BiPAP per MD request ?

## 2022-01-27 NOTE — Progress Notes (Signed)
?[facility]  ?ROUNDING NOTE  ? ?Subjective:  ? ?[name] is a 61 year old male with past medical history including seizures, anemia, bilateral BKA, hypertension, hyperlipidemia, mild cognitive impairment, CHF with EF 30 to 35%, BPH, hydradenitis suppurativa, and end-stage renal disease on hemodialysis.  Patient presents to the emergency department after being found unresponsive at dialysis clinic.  Patient has been admitted for ESRD (end stage renal disease) ([place]) [N18.6] ?Acute respiratory failure with hypoxia ([place]) [J96.01] ?Unresponsiveness [R41.89] ? ?Patient is known to our practice and receives outpatient dialysis treatments at [facility] on a Monday Wednesday Friday schedule, supervised by [facility] physicians.   ? ?Patient drowsy ?Remains intubated on vent ?No sedation ?Plans to attempt weaning later today, after dialysis ? ? ?Objective:  ?Vital signs in last 24 hours:  ?Temp:  [97.5 ?F (36.4 ?C)-101.7 ?F (38.7 ?C)] 97.5 ?F (36.4 ?C) (04/12 1114) ?Pulse Rate:  [53-78] 57 (04/12 1124) ?Resp:  [10-30] 18 (04/12 1124) ?BP: (75-151)/(42-69) 139/53 (04/12 1100) ?SpO2:  [96 %-100 %] 100 % (04/12 1124) ?FiO2 (%):  [30 %-40 %] 30 % (04/12 1124) ?Weight:  [73.8 kg] 73.8 kg (04/11 1500) ? ?Weight change: -1.5 kg ?Filed Weights  ? 01/26/2022 2246 01/26/22 0115 01/26/22 1500  ?Weight: 75.3 kg 73.8 kg 73.8 kg  ? ? ?Intake/Output: ?I/O last 3 completed shifts: ?In: 1814.7 [I.V.:996.7; NG/GT:60; IV Piggyback:758] ?Out: 1000 [Other:1000] ?  ?Intake/Output this shift: ? Total I/O ?In: 68 [I.V.:34] ?Out: 0  ? ?Physical Exam: ?General: NAD  ?Head: Normocephalic, atraumatic. Moist oral mucosal membranes  ?Eyes: Anicteric  ?Lungs:  vent  ?Heart: Regular rate and rhythm  ?Abdomen:  Soft, nontender, bowel sounds present  ?Extremities:  No peripheral edema.  ?Neurologic: drowsy  ?Skin: No lesions  ?Access: Left aVF  ? ? ?Basic Metabolic Panel: ?Recent Labs  ?Lab 02/14/2022 ?1333 01/26/22 ?0434 01/26/22 ?4332  01/27/22 ?0940  ?NA 137 138  --  135  ?K 3.4* 3.1*  --  3.2*  ?CL 96* 98  --  99  ?CO2 31 30  --  27  ?GLUCOSE 158* 74  --  72  ?BUN 30* 20  --  16  ?CREATININE 4.13* 2.92*  --  2.47*  ?CALCIUM 8.3* 8.3*  --  8.0*  ?MG  --   --  1.8  --   ?PHOS  --  2.9  --  2.9  ? ? ? ?Liver Function Tests: ?Recent Labs  ?Lab 01/21/2022 ?1333 01/27/22 ?0940  ?AST 16  --   ?ALT 8  --   ?ALKPHOS 87  --   ?BILITOT 0.7  --   ?PROT 7.8  --   ?ALBUMIN 2.1* 1.7*  ? ? ?No results for input(s): LIPASE, AMYLASE in the last 168 hours. ?No results for input(s): AMMONIA in the last 168 hours. ? ?CBC: ?Recent Labs  ?Lab 01/22/2022 ?1333 01/26/22 ?0434 01/27/22 ?0940  ?WBC 12.6* 10.0 7.8  ?NEUTROABS 10.0*  --   --   ?HGB 10.6* 9.5* 8.7*  ?HCT 37.9* 33.0* 31.0*  ?MCV 90.2 88.5 88.6  ?PLT 202 164 113*  ? ? ? ?Cardiac Enzymes: ?No results for input(s): CKTOTAL, CKMB, CKMBINDEX, TROPONINI in the last 168 hours. ? ?BNP: ?Invalid input(s): POCBNP ? ?CBG: ?Recent Labs  ?Lab 01/27/22 ?0022 01/27/22 ?0351 01/27/22 ?9518 01/27/22 ?0716 01/27/22 ?1134  ?GLUCAP 94 64* 104* 74 75  ? ? ? ?Microbiology: ?Results for orders placed or performed during the hospital encounter of 01/31/2022  ?Resp Panel by RT-PCR (Flu A&B,  Covid) Nasopharyngeal Swab     Status: None  ? Collection Time: 01/29/2022  1:35 PM  ? Specimen: Nasopharyngeal Swab; Nasopharyngeal(NP) swabs in vial transport medium  ?Result Value Ref Range Status  ? SARS Coronavirus 2 by RT PCR NEGATIVE NEGATIVE Final  ?  Comment: (NOTE) ?SARS-CoV-2 target nucleic acids are NOT DETECTED. ? ?The SARS-CoV-2 RNA is generally detectable in upper respiratory ?specimens during the acute phase of infection. The lowest ?concentration of SARS-CoV-2 viral copies this assay can detect is ?138 copies/mL. A negative result does not preclude SARS-Cov-2 ?infection and should not be used as the sole basis for treatment or ?other patient management decisions. A negative result may occur with  ?improper specimen collection/handling,  submission of specimen other ?than nasopharyngeal swab, presence of viral mutation(s) within the ?areas targeted by this assay, and inadequate number of viral ?copies(<138 copies/mL). A negative result must be combined with ?clinical observations, patient history, and epidemiological ?information. The expected result is Negative. ? ?Fact Sheet for Patients:  ?EntrepreneurPulse.com.au ? ?Fact Sheet for Healthcare Providers:  ?IncredibleEmployment.be ? ?This test is no t yet approved or cleared by the [place] FDA and  ?has been authorized for detection and/or diagnosis of SARS-CoV-2 by ?FDA under an Emergency Use Authorization (EUA). This EUA will remain  ?in effect (meaning this test can be used) for the duration of the ?COVID-19 declaration under Section 564(b)(1) of the Act, 21 ?U.S.C.section 360bbb-3(b)(1), unless the authorization is terminated  ?or revoked sooner.  ? ? ?  ? Influenza A by PCR NEGATIVE NEGATIVE Final  ? Influenza B by PCR NEGATIVE NEGATIVE Final  ?  Comment: (NOTE) ?The Xpert Xpress SARS-CoV-2/FLU/RSV plus assay is intended as an aid ?in the diagnosis of influenza from Nasopharyngeal swab specimens and ?should not be used as a sole basis for treatment. Nasal washings and ?aspirates are unacceptable for Xpert Xpress SARS-CoV-2/FLU/RSV ?testing. ? ?Fact Sheet for Patients: ?EntrepreneurPulse.com.au ? ?Fact Sheet for Healthcare Providers: ?IncredibleEmployment.be ? ?This test is not yet approved or cleared by the [place] FDA and ?has been authorized for detection and/or diagnosis of SARS-CoV-2 by ?FDA under an Emergency Use Authorization (EUA). This EUA will remain ?in effect (meaning this test can be used) for the duration of the ?COVID-19 declaration under Section 564(b)(1) of the Act, 21 U.S.C. ?section 360bbb-3(b)(1), unless the authorization is terminated or ?revoked. ? ?Performed at [facility], [place], ?[place] [address] ?  ?Culture, blood (routine x 2) Call MD if unable to obtain prior to antibiotics being given     Status: None (Preliminary result)  ? Collection Time: 01/18/2022  4:29 PM  ? Specimen: BLOOD  ?Result Value Ref Range Status  ? Specimen Description BLOOD BLOOD RIGHT HAND  Final  ? Special Requests   Final  ?  BOTTLES DRAWN AEROBIC AND ANAEROBIC Blood Culture adequate volume  ? Culture   Final  ?  NO GROWTH 2 DAYS ?Performed at [facility], [address], [place], [place] [address] ?  ? Report Status PENDING  Incomplete  ?Culture, blood (routine x 2) Call MD if unable to obtain prior to antibiotics being given     Status: None (Preliminary result)  ? Collection Time: 02/14/2022  5:54 PM  ? Specimen: BLOOD  ?Result Value Ref Range Status  ? Specimen Description BLOOD BLOOD RIGHT HAND  Final  ? Special Requests   Final  ?  BOTTLES DRAWN AEROBIC AND ANAEROBIC Blood Culture adequate volume  ? Culture  Final  ?  NO GROWTH 2 DAYS ?Performed at [facility], [address] [place], [place] [address] ?  ? Report Status PENDING  Incomplete  ?MRSA Next Gen by PCR, Nasal     Status: None  ? Collection Time: 01/27/2022  6:03 PM  ? Specimen: Nasal Mucosa; Nasal Swab  ?Result Value Ref Range Status  ? MRSA by PCR Next Gen NOT DETECTED NOT DETECTED Final  ?  Comment: (NOTE) ?The GeneXpert MRSA Assay (FDA approved for NASAL specimens only), ?is one component of a comprehensive MRSA colonization surveillance ?program. It is not intended to diagnose MRSA infection nor to guide ?or monitor treatment for MRSA infections. ?Test performance is not FDA approved in patients less than 2 years ?old. ?Performed at [facility], [place], ?[place] [address] ?  ?Culture, Respiratory w Gram Stain     Status: None (Preliminary result)  ? Collection Time: 01/26/22 12:34 PM  ? Specimen: SPU  ?Result Value Ref Range Status  ? Specimen Description   Final  ?   SPUTUM ?Performed at [facility], [address], [place], [place] [address] ?  ? Special Requests   Final  ?  NONE ?Performed at [facility], [address], [place], [place] [address] ?  ? Gram S

## 2022-01-27 NOTE — Progress Notes (Signed)
SLP Cancellation Note ? ?Patient Details ?Name: [name] ?MRN: [id number] ?DOB: [birth year] ? ? ?Cancelled treatment:       Reason Eval/Treat Not Completed: Medical issues which prohibited therapy Per RN, bronchoscopy completed on 01/26/22. Pt remains intubated at this time and having difficulty with extubation trials. SLP will follow up for evaluation when pt medically ready and schedule allows.  ? ?Martinique J Clapp MS, CCC-SLP ? ?Martinique J Clapp ?01/27/2022, 9:12 AM ?

## 2022-01-27 NOTE — Progress Notes (Signed)
PHARMACY CONSULT NOTE - FOLLOW UP ? ?Pharmacy Consult for Electrolyte Monitoring and Replacement  ? ?Recent Labs: ?Potassium (mmol/L)  ?Date Value  ?01/27/2022 3.2 (L)  ?07/24/2014 4.3  ? ?Magnesium (mg/dL)  ?Date Value  ?01/26/2022 1.8  ? ?Calcium (mg/dL)  ?Date Value  ?01/27/2022 8.0 (L)  ? ?Calcium, Total (mg/dL)  ?Date Value  ?07/24/2014 9.3  ? ?Albumin (g/dL)  ?Date Value  ?01/27/2022 1.7 (L)  ?05/11/2021 3.3 (L)  ? ?Phosphorus (mg/dL)  ?Date Value  ?01/27/2022 2.9  ? ?Sodium (mmol/L)  ?Date Value  ?01/27/2022 135  ?05/11/2021 140  ?07/24/2014 134 (L)  ? ? ?Assessment: ?Bryten Maher Punt is a 61 y.o. male admitted on 01/28/2022 with pneumonia. PMH includes ESRD on Hemodialysis MWF, HFrEF (LVEF 30-35%), Hypertension, Hyperlipidemia, Peripheral Vascular Disease s/p bilateral BKA's, T2DM, Stroke, ICH, seizures, Crohn's Disease, BPH, Hidradenitis Suppurativa on Cosentyx. Pharmacy has been consulted for electrolytes. ?  ?Presented with becoming unresponsive while at dialysis with oxygen desaturation after 15 minutes of HD session. Previously noncompliant with HD sessions. Now admitted with volume overload and concern for possible pneumonia.  ? ?Goal of Therapy:  ?Electrolytes within normal limits ? ?Plan:  ?K 3.2. Nephrology planning for HD 4/12. Returning to MWF schedule. No replacement for today. ?Electrolytes in the AM ? ? ?Wynelle Cleveland, PharmD ?Pharmacy Resident  ?01/27/2022 ?11:01 AM ?

## 2022-01-27 NOTE — Progress Notes (Signed)
? ?NAME:  [name], MRN:  [id number], DOB:  05-16-1961, LOS: 2 ?ADMISSION DATE:  02/13/2022, CONSULTATION DATE:  02/01/2022 ?REFERRING MD:  Dr. Blaine Hamper, CHIEF COMPLAINT:  Unresponsiveness, Hypoxia  ? ?Brief Pt Description / Synopsis:  ?61 y.o. Male admitted with Acute Hypoxic and Hypercapnic Respiratory Failure in the setting of Volume Overload/Pulmonary Edema, Mucus Plugging, and possible Pneumonia requiring BiPAP. ? ?History of Present Illness:  ?[name] is a 61 year old male with a significant past medical history as listed below who presented to [facility] ED on 01/30/2022 from his outpatient hemodialysis center due to unresponsiveness and hypoxia. ? ?Patient is currently lethargic and BiPAP mask is in place, therefore history is obtained from patient's spouse [name] at bedside, along with chart review.  Reportedly patient went to dialysis for his regular scheduled dialysis today, but about 15 minutes into the session, he became unresponsive and was noted to be hypoxic with O2 saturations in the 70s.  He was noted to have multiple secretions that had to be suctioned.  EMS placed nonrebreather mask with improvement of O2 sats into the 80s.  Upon arrival to the ED he was placed on BiPAP.  His mental status improved on BiPAP and no focal neuro deficits were noted. ?01/27/22 - patient extubated to BIPAP in no distress and receiving dialysis.  ? ?Pertinent  Medical History  ?ESRD on Hemodialysis ?HFrEF (LVEF 30-35%) ?Hypertension ?Hyperlipidemia ?Peripheral Vascular Disease s/p bilateral BKA's ?Diabetes Mellitus ?Stroke ?ICH ?Seizure ?DVT ?Crohn's Disease ?Peritonitis ?BPH ?Anemia ?Hidradenitis Suppurativa on [name] ? ?Micro Data:  ?4/10: SARS-CoV-2 & Influenza PCR>>negative ?4/10: Blood culture>> ?4/10: Sputum>> ?4/10: Strep pneumo urinary antigen>> ?4/10: Legionella urinary antigen>> ? ?Antimicrobials:  ?Azithromycin 4/10>> ?Ceftriaxone 4/10>> ? ?Significant Hospital Events: ?Including procedures, antibiotic start and  stop dates in addition to other pertinent events   ?4/10: Admitted to stepdown by the hospitalist.  PCCM asked for pulmonary consult due to mucous plugging and possible need for bronchoscopy ? ?Interim History / Subjective:  ?-Patient met about the hospitalist ?-Currently on BiPAP and tolerating, looks comfortable, able arouse to voice and follows simple commands ?-PCCM consulted due to right mucous plugging and possible need for bronchoscopy ?-will implement conservative measures and aggressive pulmonary toilet for now and reevaluate with chest x-ray in the morning ?-4/11 - patient has not had improvement with non invasive modalities an dremains with collapse of right lung hypoxemic. Plan for bronchoscopy today ? ?Objective   ?Blood pressure (!) 150/60, pulse 70, temperature 98.1 ?F (36.7 ?C), temperature source Oral, resp. rate 11, height 5' 11"  (1.803 m), weight 73.8 kg, SpO2 96 %. ?   ?Vent Mode: PRVC ?FiO2 (%):  [30 %-35 %] 30 % ?Set Rate:  [18 bmp] 18 bmp ?Vt Set:  [400 mL] 400 mL ?PEEP:  [5 cmH20] 5 cmH20  ? ?Intake/Output Summary (Last 24 hours) at 01/27/2022 1751 ?Last data filed at 01/27/2022 1114 ?Gross per 24 hour  ?Intake 1498.73 ml  ?Output -500 ml  ?Net 1998.73 ml  ? ? ?Filed Weights  ? 01/21/2022 2246 01/26/22 0115 01/26/22 1500  ?Weight: 75.3 kg 73.8 kg 73.8 kg  ? ? ?Examination: ?General: Acute on chronically ill-appearing frail male, sitting in bed, on BiPAP, no acute distress ?HENT: Atraumatic, normocephalic, neck supple, no JVD ?Lungs: Coarse breath sounds throughout with right lower field diminished, BiPAP assisted, even, nonlabored ?Cardiovascular: Bradycardia, regular rhythm, S1-S2, no murmurs, rubs, gallop ?Abdomen: Soft, nontender, nondistended, no guarding rebound tenderness, bowel sounds positive x4 ?Extremities: Bilateral BKA's well-healed, no cyanosis or clubbing ?  Neuro: Lethargic, arouses easily to voice, moves all extremities to command, no focal deficits, difficult to assess orientation  due to BiPAP ?GU: Deferred ? ?Resolved Hospital Problem list   ? ? ?Assessment & Plan:  ? ?Acute Hypoxic and Hypercapnic Respiratory Failure in the setting of Volume Overload/Pulmonary Edema, Mucus Plugging, and suspected Pneumonia ?-Supplemental O2 as needed to maintain O2 sats >92% ?-BiPAP, wean as tolerated ?-Follow intermittent Chest X-ray & ABG as needed ?-Bronchodilators ?-ABX as above ?-Volume removal with Hemodialysis ?-Discussed with Dr. Lanney [name] ~ will implement Aggressive Pulmonary toilet as able (Incentive spirometry/flutter valve, chest PT) with follow up CXR in the morning to see if Bronchoscopy is needed ? ?Acute on Chronic HFrEF (LVEF 30-35%) ?Mildly elevated troponin, suspect demand ischemia ?Hypertension ?-Continuous cardiac monitoring ?-Maintain MAP >65 ?-BNP >4500 ?-Volume removal with Hemodialysis ?-HS Troponin peaked at 19 ?-Lactic acid is normal at 0.9 ?-Echocardiogram 05/13/21: LVEF 30-35%, Grade II diastolic dysfunction, moderately reduced RV systolic function, mild to moderate Mitral regurgitation, moderate tricuspid regurgitation ?-Resume home antihypertensives when able to tolerate p.o. ? ?Concern for possible Pneumonia ?Meet SIRS criteria upon admission (RR 22, WBC 12.6) ?-Monitor fever curve ?-Trend WBC's & Procalcitonin ?-Follow cultures as above ?-Continue empiric Azithromycin & Ceftriaxone pending cultures & sensitivities ? ?ESRD on Hemodialysis ?Mild Hypokalemia ?-Monitor I&O's / urinary output ?-Follow BMP ?-Ensure adequate renal perfusion ?-Avoid nephrotoxic agents as able ?-Replace electrolytes as indicated ?-Nephrology consulted, appreciate input ~ HD as per Nephrology ? ?Acute Metabolic Encephalopathy, suspect due to Hypoxia ?PMHx: Stroke, Seizure disorder ?-Provide supportive care ?-Treatment of Hypoxia as outlined above ?-Avoid sedating meds ?-Continue home Keppra ? ?Anemia of Chronic Disease ?-Monitor for S/Sx of bleeding ?-Trend CBC ?-Heparin SQ for VTE Prophylaxis   ?-Transfuse for Hgb <7 ? ?Diabetes Mellitus ?-CBG's q4h; Target range of 140 to 180 ?-SSI ?-Follow ICU Hypo/Hyperglycemia protocol ? ? ? ? ? ?Best Practice (right click and "Reselect all SmartList Selections" daily)  ? ?Diet/type: NPO while on BiPAP ?DVT prophylaxis: prophylactic heparin  ?GI prophylaxis: N/A ?Lines: N/A ?Foley:  N/A ?Code Status:  full code ?Last date of multidisciplinary goals of care discussion [N/A] ? ?Updated pt's spouse [name] at bedside 4/10.  All questions answered to her satisfaction. ? ?Labs   ?CBC: ?Recent Labs  ?Lab 02/13/2022 ?1333 01/26/22 ?0434 01/27/22 ?0940  ?WBC 12.6* 10.0 7.8  ?NEUTROABS 10.0*  --   --   ?HGB 10.6* 9.5* 8.7*  ?HCT 37.9* 33.0* 31.0*  ?MCV 90.2 88.5 88.6  ?PLT 202 164 113*  ? ? ? ?Basic Metabolic Panel: ?Recent Labs  ?Lab 02/01/2022 ?1333 01/26/22 ?0434 01/26/22 ?9371 01/27/22 ?0940  ?NA 137 138  --  135  ?K 3.4* 3.1*  --  3.2*  ?CL 96* 98  --  99  ?CO2 31 30  --  27  ?GLUCOSE 158* 74  --  72  ?BUN 30* 20  --  16  ?CREATININE 4.13* 2.92*  --  2.47*  ?CALCIUM 8.3* 8.3*  --  8.0*  ?MG  --   --  1.8  --   ?PHOS  --  2.9  --  2.9  ? ? ?GFR: ?Estimated Creatinine Clearance: 33.6 mL/min (A) (by C-G formula based on SCr of 2.47 mg/dL (H)). ?Recent Labs  ?Lab 01/23/2022 ?1333 01/31/2022 ?1628 02/02/2022 ?1942 01/26/22 ?0434 01/27/22 ?0236 01/27/22 ?0940  ?PROCALCITON 0.33  --   --  1.11 1.61  --   ?WBC 12.6*  --   --  10.0  --  7.8  ?LATICACIDVEN  --  0.9 1.2  --   --   --   ? ? ? ?Liver Function Tests: ?Recent Labs  ?Lab 01/23/2022 ?1333 01/27/22 ?0940  ?AST 16  --   ?ALT 8  --   ?ALKPHOS 87  --   ?BILITOT 0.7  --   ?PROT 7.8  --   ?ALBUMIN 2.1* 1.7*  ? ? ?No results for input(s): LIPASE, AMYLASE in the last 168 hours. ?No results for input(s): AMMONIA in the last 168 hours. ? ?ABG ?   ?Component Value Date/Time  ? PHART 7.41 04/13/2017 1455  ? PCO2ART 36 04/13/2017 1455  ? PO2ART 53 (L) 04/13/2017 1455  ? HCO3 35.7 (H) 01/19/2022 1334  ? TCO2 25 09/12/2020 1119  ? ACIDBASEDEF 1.5  04/13/2017 1455  ? O2SAT 69.5 01/31/2022 1334  ? ?  ? ?Coagulation Profile: ?No results for input(s): INR, PROTIME in the last 168 hours. ? ?Cardiac Enzymes: ?No results for input(s): CKTOTAL, CKMB, CKMBINDEX,

## 2022-01-28 DIAGNOSIS — Z7189 Other specified counseling: Secondary | ICD-10-CM | POA: Diagnosis not present

## 2022-01-28 DIAGNOSIS — F32A Depression, unspecified: Secondary | ICD-10-CM | POA: Diagnosis present

## 2022-01-28 DIAGNOSIS — Z992 Dependence on renal dialysis: Secondary | ICD-10-CM | POA: Diagnosis not present

## 2022-01-28 DIAGNOSIS — D631 Anemia in chronic kidney disease: Secondary | ICD-10-CM | POA: Diagnosis not present

## 2022-01-28 DIAGNOSIS — R4189 Other symptoms and signs involving cognitive functions and awareness: Secondary | ICD-10-CM | POA: Diagnosis not present

## 2022-01-28 DIAGNOSIS — F320 Major depressive disorder, single episode, mild: Secondary | ICD-10-CM | POA: Diagnosis not present

## 2022-01-28 DIAGNOSIS — J9601 Acute respiratory failure with hypoxia: Secondary | ICD-10-CM | POA: Diagnosis not present

## 2022-01-28 DIAGNOSIS — N186 End stage renal disease: Secondary | ICD-10-CM | POA: Diagnosis not present

## 2022-01-28 LAB — CBC
HCT: 30.1 % — ABNORMAL LOW (ref 39.0–52.0)
Hemoglobin: 8.6 g/dL — ABNORMAL LOW (ref 13.0–17.0)
MCH: 24.9 pg — ABNORMAL LOW (ref 26.0–34.0)
MCHC: 28.6 g/dL — ABNORMAL LOW (ref 30.0–36.0)
MCV: 87 fL (ref 80.0–100.0)
Platelets: 139 10*3/uL — ABNORMAL LOW (ref 150–400)
RBC: 3.46 MIL/uL — ABNORMAL LOW (ref 4.22–5.81)
RDW: 21.2 % — ABNORMAL HIGH (ref 11.5–15.5)
WBC: 9.4 10*3/uL (ref 4.0–10.5)
nRBC: 0 % (ref 0.0–0.2)

## 2022-01-28 LAB — CULTURE, RESPIRATORY W GRAM STAIN
Culture: NORMAL
Gram Stain: NONE SEEN

## 2022-01-28 LAB — GLUCOSE, CAPILLARY
Glucose-Capillary: 73 mg/dL (ref 70–99)
Glucose-Capillary: 115 mg/dL — ABNORMAL HIGH (ref 70–99)
Glucose-Capillary: 57 mg/dL — ABNORMAL LOW (ref 70–99)
Glucose-Capillary: 61 mg/dL — ABNORMAL LOW (ref 70–99)
Glucose-Capillary: 98 mg/dL (ref 70–99)
Glucose-Capillary: 115 mg/dL — ABNORMAL HIGH (ref 70–99)
Glucose-Capillary: 143 mg/dL — ABNORMAL HIGH (ref 70–99)

## 2022-01-28 LAB — BASIC METABOLIC PANEL
Anion gap: 9 (ref 5–15)
BUN: 19 mg/dL (ref 6–20)
CO2: 29 mmol/L (ref 22–32)
Calcium: 8 mg/dL — ABNORMAL LOW (ref 8.9–10.3)
Chloride: 98 mmol/L (ref 98–111)
Creatinine, Ser: 2.49 mg/dL — ABNORMAL HIGH (ref 0.61–1.24)
GFR, Estimated: 29 mL/min — ABNORMAL LOW (ref >60)
Glucose, Bld: 138 mg/dL — ABNORMAL HIGH (ref 70–99)
Potassium: 3.4 mmol/L — ABNORMAL LOW (ref 3.5–5.1)
Sodium: 136 mmol/L (ref 135–145)

## 2022-01-28 LAB — MAGNESIUM: Magnesium: 1.9 mg/dL (ref 1.7–2.4)

## 2022-01-28 LAB — PHOSPHORUS: Phosphorus: 3.3 mg/dL (ref 2.5–4.6)

## 2022-01-28 MED ORDER — SEVELAMER CARBONATE 800 MG PO TABS
800.0000 mg | ORAL_TABLET | ORAL | Status: DC
  Administered 2022-01-28: 800 mg via ORAL

## 2022-01-28 MED ORDER — MIRTAZAPINE 15 MG PO TBDP
7.5000 mg | ORAL_TABLET | Freq: Every day | ORAL | Status: DC
  Administered 2022-01-28 – 2022-01-30 (×3): 7.5 mg via ORAL
  Filled 2022-01-28 (×4): qty 0.5

## 2022-01-28 MED ORDER — POLYETHYLENE GLYCOL 3350 17 G PO PACK
17.0000 g | PACK | Freq: Every day | ORAL | Status: DC
Start: 2022-01-29 — End: 2022-01-31
  Administered 2022-01-29: 17 g via ORAL
  Filled 2022-01-28 (×3): qty 1

## 2022-01-28 MED ORDER — HYDROCODONE-ACETAMINOPHEN 10-325 MG PO TABS
1.0000 | ORAL_TABLET | Freq: Every day | ORAL | Status: DC
  Administered 2022-01-29 – 2022-02-02 (×5): 1 via ORAL
  Filled 2022-01-28 (×5): qty 1

## 2022-01-28 MED ORDER — GLYCOPYRROLATE 0.2 MG/ML IJ SOLN
0.1000 mg | Freq: Once | INTRAMUSCULAR | Status: AC
  Administered 2022-01-28: 0.1 mg via INTRAVENOUS

## 2022-01-28 MED ORDER — GABAPENTIN 250 MG/5ML PO SOLN
100.0000 mg | Freq: Every day | ORAL | Status: DC
  Administered 2022-01-28 – 2022-01-29 (×2): 100 mg via ORAL
  Filled 2022-01-28 (×5): qty 2

## 2022-01-28 MED ORDER — HYDRALAZINE HCL 50 MG PO TABS
100.0000 mg | ORAL_TABLET | Freq: Three times a day (TID) | ORAL | Status: DC
  Administered 2022-01-28 – 2022-01-31 (×7): 100 mg via ORAL
  Filled 2022-01-28 (×8): qty 2

## 2022-01-28 MED ORDER — LEVETIRACETAM 750 MG PO TABS
750.0000 mg | ORAL_TABLET | Freq: Every day | ORAL | Status: DC
  Administered 2022-01-29 – 2022-01-31 (×3): 750 mg via ORAL
  Filled 2022-01-28 (×3): qty 1

## 2022-01-28 MED ORDER — CARVEDILOL 25 MG PO TABS
25.0000 mg | ORAL_TABLET | Freq: Two times a day (BID) | ORAL | Status: DC
  Administered 2022-01-28 – 2022-01-31 (×5): 25 mg via ORAL
  Filled 2022-01-28 (×5): qty 1

## 2022-01-28 MED ORDER — IPRATROPIUM-ALBUTEROL 0.5-2.5 (3) MG/3ML IN SOLN
3.0000 mL | Freq: Four times a day (QID) | RESPIRATORY_TRACT | Status: DC
  Administered 2022-01-29: 3 mL via RESPIRATORY_TRACT
  Filled 2022-01-28: qty 3

## 2022-01-28 MED ORDER — ACETAMINOPHEN 160 MG/5ML PO SOLN
650.0000 mg | Freq: Four times a day (QID) | ORAL | Status: DC | PRN
  Filled 2022-01-28: qty 20.3

## 2022-01-28 MED ORDER — ISOSORBIDE DINITRATE 30 MG PO TABS
15.0000 mg | ORAL_TABLET | Freq: Two times a day (BID) | ORAL | Status: DC
Start: 2022-01-28 — End: 2022-01-31
  Administered 2022-01-28 – 2022-01-31 (×5): 15 mg via ORAL
  Filled 2022-01-28 (×6): qty 0.5

## 2022-01-28 MED ORDER — ASCORBIC ACID 500 MG PO TABS
500.0000 mg | ORAL_TABLET | Freq: Two times a day (BID) | ORAL | Status: DC
  Administered 2022-01-28 – 2022-01-31 (×6): 500 mg via ORAL
  Filled 2022-01-28 (×6): qty 1

## 2022-01-28 MED ORDER — DOCUSATE SODIUM 100 MG PO CAPS
100.0000 mg | ORAL_CAPSULE | Freq: Two times a day (BID) | ORAL | Status: DC
  Administered 2022-01-28 – 2022-01-30 (×3): 100 mg via ORAL
  Filled 2022-01-28 (×4): qty 1

## 2022-01-28 MED ORDER — CLONIDINE HCL 0.1 MG PO TABS
0.1000 mg | ORAL_TABLET | Freq: Two times a day (BID) | ORAL | Status: DC
  Administered 2022-01-28 – 2022-01-31 (×6): 0.1 mg via ORAL
  Filled 2022-01-28 (×6): qty 1

## 2022-01-28 MED ORDER — DEXTROSE 50 % IV SOLN
25.0000 mL | Freq: Once | INTRAVENOUS | Status: AC
  Administered 2022-01-28: 25 mL via INTRAVENOUS

## 2022-01-28 MED ORDER — LISINOPRIL 20 MG PO TABS
40.0000 mg | ORAL_TABLET | Freq: Every day | ORAL | Status: DC
  Administered 2022-01-29 – 2022-01-31 (×3): 40 mg via ORAL
  Filled 2022-01-28 (×3): qty 2

## 2022-01-28 MED ORDER — SEVELAMER CARBONATE 800 MG PO TABS
800.0000 mg | ORAL_TABLET | ORAL | Status: DC
  Administered 2022-01-29: 800 mg via ORAL
  Filled 2022-01-28: qty 1

## 2022-01-28 MED ORDER — NEPRO/CARBSTEADY PO LIQD
237.0000 mL | Freq: Three times a day (TID) | ORAL | Status: DC
Start: 2022-01-28 — End: 2022-02-03
  Administered 2022-01-28 – 2022-02-02 (×4): 237 mL via ORAL

## 2022-01-28 MED ORDER — AMLODIPINE BESYLATE 10 MG PO TABS
10.0000 mg | ORAL_TABLET | Freq: Every evening | ORAL | Status: DC
  Administered 2022-01-28 – 2022-01-30 (×3): 10 mg via ORAL
  Filled 2022-01-28 (×3): qty 1

## 2022-01-28 MED ORDER — ADULT MULTIVITAMIN W/MINERALS CH
1.0000 | ORAL_TABLET | Freq: Every day | ORAL | Status: DC
Start: 2022-01-29 — End: 2022-01-31
  Administered 2022-01-29 – 2022-01-31 (×3): 1 via ORAL
  Filled 2022-01-28 (×3): qty 1

## 2022-01-28 MED ORDER — ATORVASTATIN CALCIUM 80 MG PO TABS
80.0000 mg | ORAL_TABLET | Freq: Every day | ORAL | Status: DC
Start: 2022-01-28 — End: 2022-01-31
  Administered 2022-01-28 – 2022-01-30 (×3): 80 mg via ORAL
  Filled 2022-01-28 (×3): qty 1

## 2022-01-28 MED ORDER — NEPRO/CARBSTEADY PO LIQD: 237.0000 mL | Freq: Three times a day (TID) | ORAL | Status: DC

## 2022-01-28 NOTE — Consult Note (Signed)
NAME: [name]  ?DOB: June 16, 1961  ?MRN: [id number]  ?Date/Time: 01/28/2022 2:47 PM ? ?REQUESTING PROVIDER: Dr.Aleskerov ?Subjective:  ?REASON FOR CONSULT: wound infection ?Patient is patient is too weak and chronically ill to give any history.  Wife at bedside.  Chart reviewed in great detail.  All notes including Care Everywhere and [facility] records reviewed ?[name] is a 61 y.o. male with a history of ESRD, CVA, CAD, cardiomyopathy, DM, , Hidradenitis presented on 02/05/2022 from dialysis center after he became unresponsive after 10 min into dialysis. He was hypoxic and sats 70% . He had lot of secretions which had to be suctioned by EMS ?Vitals BP 163/73, rr 22, temp 98, sats 79% ?Pulse 77 ?Labs showed wbc of 12.6, HB 10.6, glucose 158 ?CXR showed white out rt lung ?He was started on broad spectrum antibiotics after sending out cultures ?He was admitted to ICU and underwent broch for collapse rt lung and mucus plus were removed on 01/26/22, BAL culture neg ?Blood culture neg ?I am asked to see him for efecium from the hidradenitis swab ?He gets cosentyx for HS- followed by [facility] derm ? ?Past Medical History:  ?Diagnosis Date  ? Acute metabolic encephalopathy 33/54/5625  ? Anemia   ? Cardiomyopathy ([place])   ? a. 08/2020 Echo: EF 40-45%, glbo HK, GrII DD; b. 04/2021 Echo: EF 30-35%, glob HK, mild LVH, GrII DD.  ? Coronary artery calcification seen on CT scan   ? Crohn disease ([place])   ? Diabetes mellitus without complication ([place])   ? DVT of lower extremity (deep venous thrombosis) ([place]) 2016  ? Empyema ([place]) 05/20/2017  ? Encephalopathy 12/04/2017  ? ESRD (end stage renal disease) on hemodyalisis ([place])   ? Fall at home, initial encounter 09/12/2020  ? HFrEF (heart failure with reduced ejection fraction) ([place])   ? a. 08/2020 Echo: EF 40-45%, glbo HK, mod conc LVH, GrII DD, Ao root 19m; b. 04/2021 Echo: EF 30-35%, glob HK, mild LVH, GrII DD, mod red RV fxn, nl PASP, sev dil LA, mild-mod MR, mod TR, mild-mod AoV  sclerosis w/o stenosis.  ? Hidradenitis suppurativa   ? Hypertension   ? ICH (intracerebral hemorrhage) ([place]   ? Peritonitis ([place] 04/21/2017  ? Pyogenic arthritis of knee ([place] 02/04/2016  ? Sepsis ([place] 01/12/2018  ? Stroke ([facility]   ? H/o streptococcal arthritis left knee ?Osteo of finger left hand ?Past Surgical History:  ?Procedure Laterality Date  ? A/V FISTULAGRAM Left 02/24/2021  ? Procedure: A/V FISTULAGRAM;  Surgeon: SKatha Cabal MD;  [facility];  Service: Cardiovascular;  Laterality: Left;  ? ABDOMINAL SURGERY    ? AMPUTATION FINGER Left 06/2019  ? PR AMPUTATION LONG FINGER/THUMB+FLAPS [facility]  ? [facility]   [facility]  ? BELOW KNEE LEG AMPUTATION Right 08/2017  ? [facility]  ? COLONOSCOPY    ? COLONOSCOPY WITH PROPOFOL N/A 10/28/2020  ? Procedure: COLONOSCOPY WITH PROPOFOL;  Surgeon: VLin Landsman MD;  [facility];  Service: Gastroenterology;  Laterality: N/A;  ? COLONOSCOPY WITH PROPOFOL N/A 11/21/2020  ? Procedure: COLONOSCOPY WITH PROPOFOL;  Surgeon: WLucilla Lame MD;  [facility];  Service: Endoscopy;  Laterality: N/A;  ? DIALYSIS/PERMA CATHETER INSERTION N/A 12/09/2017  ? Procedure: DIALYSIS/PERMA CATHETER INSERTION;  Surgeon: SKatha Cabal MD;  [facility];  Service: Cardiovascular;  Laterality: N/A;  ? DIALYSIS/PERMA CATHETER INSERTION N/A 12/12/2017  ? Procedure: DIALYSIS/PERMA CATHETER INSERTION;  Surgeon: DAlgernon Huxley  MD;  Location: [place] CV LAB;  Service: Cardiovascular;  Laterality: N/A;  ? DIALYSIS/PERMA CATHETER REMOVAL Left 12/09/2017  ? Procedure: DIALYSIS/PERMA CATHETER REMOVAL;  Surgeon: Katha Cabal, MD;  Location: [place] CV LAB;  Service: Cardiovascular;  Laterality: Left;  ? ESOPHAGOGASTRODUODENOSCOPY (EGD) WITH PROPOFOL N/A 11/20/2020  ? Procedure: ESOPHAGOGASTRODUODENOSCOPY (EGD) WITH PROPOFOL;  Surgeon: [name], MD;  [facility];  Service: Endoscopy;   Laterality: N/A;  ? KNEE SURGERY Left 02/04/2016  ? [facility]  ? LEG AMPUTATION THROUGH LOWER TIBIA AND FIBULA Left 06/22/2018  ? [facility]  ? LOWER EXTREMITY ANGIOGRAPHY Right 08/08/2017  ? Procedure: Lower Extremity Angiography;  Surgeon: Algernon Huxley, MD;  Location: [place] CV LAB;  Service: Cardiovascular;  Laterality: Right;  ? LOWER EXTREMITY ANGIOGRAPHY Right 08/22/2017  ? Procedure: Lower Extremity Angiography;  Surgeon: Algernon Huxley, MD;  Location: [place] CV LAB;  Service: Cardiovascular;  Laterality: Right;  ? LOWER EXTREMITY INTERVENTION  08/08/2017  ? Procedure: LOWER EXTREMITY INTERVENTION;  Surgeon: Algernon Huxley, MD;  Location: [place] CV LAB;  Service: Cardiovascular;;  ? LOWER EXTREMITY INTERVENTION  08/22/2017  ? Procedure: LOWER EXTREMITY INTERVENTION;  Surgeon: Algernon Huxley, MD;  Location: [place] CV LAB;  Service: Cardiovascular;;  ?  ?Social History  ? ?Socioeconomic History  ? Marital status: Married  ?  Spouse name: Not on file  ? Number of children: 2  ? Years of education: Not on file  ? Highest education level: Associate degree: occupational, Hotel manager, or vocational program  ?Occupational History  ? Occupation: disable  ?Tobacco Use  ? Smoking status: Former  ? Smokeless tobacco: Never  ? Tobacco comments:  ?  smokes marijuana  ?Vaping Use  ? Vaping Use: Never used  ?Substance and Sexual Activity  ? Alcohol use: No  ? Drug use: Yes  ?  Types: Marijuana  ? Sexual activity: Not Currently  ?Other Topics Concern  ? Not on file  ?Social History Narrative  ? Not on file  ? ?Social Determinants of Health  ? ?Financial Resource Strain: Low Risk   ? Difficulty of Paying Living Expenses: Not hard at all  ?Food Insecurity: No Food Insecurity  ? Worried About Charity fundraiser in the Last Year: Never true  ? Ran Out of Food in the Last Year: Never true  ?Transportation Needs: No Transportation Needs  ? Lack of Transportation (Medical): No  ? Lack of Transportation (Non-Medical): No   ?Physical Activity: Inactive  ? Days of Exercise per Week: 0 days  ? Minutes of Exercise per Session: 0 min  ?Stress: No Stress Concern Present  ? Feeling of Stress : Not at all  ?Social Connections: Moderately Isolated  ? Frequency of Communication with Friends and Family: Twice a week  ? Frequency of Social Gatherings with Friends and Family: More than three times a week  ? Attends Religious Services: Never  ? Active Member of Clubs or Organizations: No  ? Attends Archivist Meetings: Never  ? Marital Status: Married  ?Intimate Partner Violence: Not At Risk  ? Fear of Current or Ex-Partner: No  ? Emotionally Abused: No  ? Physically Abused: No  ? Sexually Abused: No  ?  ?Family History  ?Problem Relation Age of Onset  ? Irritable bowel syndrome Sister   ? Diabetes Sister   ? Heart disease Mother   ? Diabetes Mother   ? Heart disease Father   ? Rheumatic fever Father   ?  as child  ? Psoriasis Brother   ? Arthritis Brother   ? Diabetes Sister   ? Diabetes Sister   ? ?Allergies  ?Allergen Reactions  ? Methotrexate Other (See Comments)  ?  Blood count drops  ? Vancomycin Shortness Of Breath  ?  Eyes watering, SOB, wheezing  ? Cefepime Other (See Comments)  ?  Confusion   ? Tape   ? ?I? ?Current Facility-Administered Medications  ?Medication Dose Route Frequency Provider Last Rate Last Admin  ? 0.9 %  sodium chloride infusion  100 mL Intravenous PRN Lateef, Munso[place], MD      ? 0.9 %  sodium chloride infusion  100 mL Intravenous PRN Lateef, Munsoor, MD      ? acetaminophen (TYLENOL) 160 MG/5ML solution 650 mg  650 mg Oral Q6H PRN Benita Gutter, RPH      ? albuterol (PROVENTIL) (2.5 MG/3ML) 0.083% nebulizer solution 2.5 mg  2.5 mg Nebulization Q4H PRN Ivor Costa, MD      ? alteplase (CATHFLO ACTIVASE) injection 2 mg  2 mg Intracatheter Once PRN Lateef, Munsoor, MD      ? amLODipine (NORVASC) tablet 10 mg  10 mg Oral QPM Benita Gutter, RPH      ? Ampicillin-Sulbactam (UNASYN) 3 g in sodium chloride 0.9  % 100 mL IVPB  3 g Intravenous Q24H Wynelle Cleveland, [facility]   Stopped at 01/27/22 2038  ? ascorbic acid (VITAMIN C) tablet 500 mg  500 mg Oral BID Ottie Glazier, MD      ? atorvastatin (LIPITOR) tablet 80 mg

## 2022-01-28 NOTE — Progress Notes (Signed)
Nutrition Follow Up Note  ? ?DOCUMENTATION CODES:  ? ?Severe malnutrition in context of chronic illness ? ?INTERVENTION:  ? ?Nepro Shake po TID, each supplement provides 425 kcal and 19 grams protein ? ?Magic cup TID with meals, each supplement provides 290 kcal and 9 grams of protein ? ?MVI po daily  ? ?Vitamin C 568m po BID ? ?Pt at high refeed risk; recommend monitor potassium, magnesium and phosphorus labs daily until stable ? ?NUTRITION DIAGNOSIS:  ? ?Severe Malnutrition related to chronic illness (ESRD on HD, DM, Crohn's disease, CHF, CVA) as evidenced by severe fat depletion, severe muscle depletion. ? ?GOAL:  ? ?Patient will meet greater than or equal to 90% of their needs ?-not met  ? ?MONITOR:  ? ?PO intake, Supplement acceptance, Labs, Weight trends, Skin, I & O's ? ?ASSESSMENT:  ? ?61y/o male with h/o DM, bilateral BKAs, CHF, ESRD on HD, BPH, anemia, perianal Crohn's disease/hidradenitis suppurativa with perianal fistulas (with remicade infusions and azothiaprine), ICH, seizures, PVD, COVID 19 (07/2021), DVT, peritonitis, HTN, cardiomyopathy and recent admission for sepsis, hypoglcemia, PNA and cellulitis of buttocks/sacrum who is now admitted with mucus plug and volume overload. ? ?Pt intubated 4/11 for bronchoscopy; pt extubated yesterday. Wife refusing enteral nutrition while pt ventilated. Wife is also refusing G-tube placement. Met with pt in room today. Pt sitting up in chair at time of RD visit. Pt agitated today, cursing at his nurse and trying to hit people at times. Pt seen by SLP today and placed on a dysphagia 3/nectar thick diet. Pt drank a chocolate Glucerna for breakfast this morning and is asking for a vanilla supplement at time of RD visit. RD provided pt with a vanilla Nepro. RD will add supplements and MVI to help pt meet his estimated needs. Pt is at refeed risk. Palliative care is following. Psychiatry consult is pending.  ? ?Per chart, pt is down 3lbs(2%) since admit.   ? ?Medications reviewed and include: colace, heparin, insulin, MVI, protonix, miralax, renvela, unasyn ? ?Labs reviewed: K 3.2(L), creat 2.47(H), P 2.9 wnl, Mg 1.8 wnl ?Hgb 8.7(L), Hct 31.0(L), MCH 24.9(L), MCHC 28.1(L) ?Cbgs- 98, 115, 61, 57 x 24 hrs ? ?Diet Order:   ?Diet Order   ? ?       ?  DIET DYS 3 Room service appropriate? Yes; Fluid consistency: Nectar Thick  Diet effective now       ?  ? ?  ?  ? ?  ? ?EDUCATION NEEDS:  ? ?Education needs have been addressed ? ?Skin:  Skin Assessment: Reviewed RN Assessment (Wounds to buttocks and sacral area related to hidradenitis suppurativa.) ? ?Last BM:  4/10- type 6 ? ?Height:  ? ?Ht Readings from Last 1 Encounters:  ?02/13/2022 5' 11"  (1.803 m)  ? ? ?Weight:  ? ?Wt Readings from Last 1 Encounters:  ?01/26/22 73.8 kg  ? ?BMI:  Body mass index is 22.69 kg/m?. ? ?Estimated Nutritional Needs:  ? ?Kcal:  1900-2200kcal/day ? ?Protein:  95-110g/day ? ?Fluid:  1.8-2.1L/day ? ?CKoleen DistanceMS, RD, LDN ?Please refer to AMION for RD and/or RD on-call/weekend/after hours pager ? ?

## 2022-01-28 NOTE — Progress Notes (Signed)
0800 Patient started out the day well but that quickly changed with his bath and buttock dressing changes. Patient tried to refuse bath but agreed after a few minutes. Tried to Counselling psychologist and cursed BOTH nurse and NT. Dressing changes to buttocks and scrotum done. 1100 Up in chair using Hoyer lift with help from PT. Patient not happy. After 10 minutes patient ready to go back to bed. Explained that being up in chair was helpful for his lungs and overall well being. Cursed nurse and NT. Lunch ordered. Patient stated his buttocks hurt repositioned in chair. Attempted to give patient hydrocodone for pain but patient refused to swallow his crushed medication. Patient fed lunch by family. Patient ate well. Repositioned in chair x 4 times before he was up an hour. Continued to refuse his pain medication and other medications crushed in ice cream. Dr. Lanney [name] in to talk with patient per patient request. Given Ativan 1 mg and glycopyrolate per MD order.1300 As promised patient was put back in bed per [employer]. Patient rude,cursing and yelling for his brother in law [place]. Patient stated he was going to sue the hospital and nurse. When ask why patient states because we dropped him. Patient was lifted from bed to chair without incident. Cleaned of small BM and left sacral dressing changed because of soiling with stool. [place] with Pallative care in to see patient. ?

## 2022-01-28 NOTE — Progress Notes (Signed)
?[facility]  ?ROUNDING NOTE  ? ?Subjective:  ? ?[name] is a 61 year old male with past medical history including seizures, anemia, bilateral BKA, hypertension, hyperlipidemia, mild cognitive impairment, CHF with EF 30 to 35%, BPH, hydradenitis suppurativa, and end-stage renal disease on hemodialysis.  Patient presents to the emergency department after being found unresponsive at dialysis clinic.  Patient has been admitted for ESRD (end stage renal disease) ([place]) [N18.6] ?Acute respiratory failure with hypoxia ([place]) [J96.01] ?Unresponsiveness [R41.89] ? ?Patient is known to our practice and receives outpatient dialysis treatments at [facility] on a Monday Wednesday Friday schedule, supervised by [facility] physicians.   ? ?Update:   ?Patient now extubated. ?Appears to be breathing comfortably. ?Underwent hemodialysis treatment yesterday. ? ? ?Objective:  ?Vital signs in last 24 hours:  ?Temp:  [97.5 ?F (36.4 ?C)-98.1 ?F (36.7 ?C)] 97.8 ?F (36.6 ?C) (04/12 2000) ?Pulse Rate:  [56-80] 71 (04/13 0800) ?Resp:  [0-25] 19 (04/13 0800) ?BP: (129-164)/(40-76) 149/53 (04/13 0800) ?SpO2:  [90 %-100 %] 95 % (04/13 0800) ?FiO2 (%):  [30 %] 30 % (04/13 0400) ? ?Weight change:  ?Filed Weights  ? 02/04/2022 2246 01/26/22 0115 01/26/22 1500  ?Weight: 75.3 kg 73.8 kg 73.8 kg  ? ? ?Intake/Output: ?I/O last 3 completed shifts: ?In: 698.7 [I.V.:330.7; NG/GT:60; IV CVELFYBOF:751] ?Out: 501 [Other:501] ?  ?Intake/Output this shift: ? No intake/output data recorded. ? ?Physical Exam: ?General: NAD  ?Head: Normocephalic, atraumatic. Moist oral mucosal membranes  ?Eyes: Anicteric  ?Lungs:  vent  ?Heart: Regular rate and rhythm  ?Abdomen:  Soft, nontender, bowel sounds present  ?Extremities: No peripheral edema. LE amputations  ?Neurologic: Awake, alert, will follow simple commands  ?Skin: No lesions  ?Access: Left aVF  ? ? ?Basic Metabolic Panel: ?Recent Labs  ?Lab 02/08/2022 ?1333 01/26/22 ?0434 01/26/22 ?0258  01/27/22 ?0940  ?NA 137 138  --  135  ?K 3.4* 3.1*  --  3.2*  ?CL 96* 98  --  99  ?CO2 31 30  --  27  ?GLUCOSE 158* 74  --  72  ?BUN 30* 20  --  16  ?CREATININE 4.13* 2.92*  --  2.47*  ?CALCIUM 8.3* 8.3*  --  8.0*  ?MG  --   --  1.8  --   ?PHOS  --  2.9  --  2.9  ? ? ? ?Liver Function Tests: ?Recent Labs  ?Lab 01/26/2022 ?1333 01/27/22 ?0940  ?AST 16  --   ?ALT 8  --   ?ALKPHOS 87  --   ?BILITOT 0.7  --   ?PROT 7.8  --   ?ALBUMIN 2.1* 1.7*  ? ? ?No results for input(s): LIPASE, AMYLASE in the last 168 hours. ?No results for input(s): AMMONIA in the last 168 hours. ? ?CBC: ?Recent Labs  ?Lab 01/17/2022 ?1333 01/26/22 ?0434 01/27/22 ?0940  ?WBC 12.6* 10.0 7.8  ?NEUTROABS 10.0*  --   --   ?HGB 10.6* 9.5* 8.7*  ?HCT 37.9* 33.0* 31.0*  ?MCV 90.2 88.5 88.6  ?PLT 202 164 113*  ? ? ? ?Cardiac Enzymes: ?No results for input(s): CKTOTAL, CKMB, CKMBINDEX, TROPONINI in the last 168 hours. ? ?BNP: ?Invalid input(s): POCBNP ? ?CBG: ?Recent Labs  ?Lab 01/27/22 ?2357 01/28/22 ?5277 01/28/22 ?8242 01/28/22 ?0750 01/28/22 ?0810  ?GLUCAP 86 73 57* 61* 115*  ? ? ? ?Microbiology: ?Results for orders placed or performed during the hospital encounter of 01/28/2022  ?Resp Panel by RT-PCR (Flu A&B, Covid) Nasopharyngeal Swab     Status: None  ?  Collection Time: 02/11/2022  1:35 PM  ? Specimen: Nasopharyngeal Swab; Nasopharyngeal(NP) swabs in vial transport medium  ?Result Value Ref Range Status  ? SARS Coronavirus 2 by RT PCR NEGATIVE NEGATIVE Final  ?  Comment: (NOTE) ?SARS-CoV-2 target nucleic acids are NOT DETECTED. ? ?The SARS-CoV-2 RNA is generally detectable in upper respiratory ?specimens during the acute phase of infection. The lowest ?concentration of SARS-CoV-2 viral copies this assay can detect is ?138 copies/mL. A negative result does not preclude SARS-Cov-2 ?infection and should not be used as the sole basis for treatment or ?other patient management decisions. A negative result may occur with  ?improper specimen collection/handling,  submission of specimen other ?than nasopharyngeal swab, presence of viral mutation(s) within the ?areas targeted by this assay, and inadequate number of viral ?copies(<138 copies/mL). A negative result must be combined with ?clinical observations, patient history, and epidemiological ?information. The expected result is Negative. ? ?Fact Sheet for Patients:  ?EntrepreneurPulse.com.au ? ?Fact Sheet for Healthcare Providers:  ?IncredibleEmployment.be ? ?This test is no t yet approved or cleared by the [place] FDA and  ?has been authorized for detection and/or diagnosis of SARS-CoV-2 by ?FDA under an Emergency Use Authorization (EUA). This EUA will remain  ?in effect (meaning this test can be used) for the duration of the ?COVID-19 declaration under Section 564(b)(1) of the Act, 21 ?U.S.C.section 360bbb-3(b)(1), unless the authorization is terminated  ?or revoked sooner.  ? ? ?  ? Influenza A by PCR NEGATIVE NEGATIVE Final  ? Influenza B by PCR NEGATIVE NEGATIVE Final  ?  Comment: (NOTE) ?The Xpert Xpress SARS-CoV-2/FLU/RSV plus assay is intended as an aid ?in the diagnosis of influenza from Nasopharyngeal swab specimens and ?should not be used as a sole basis for treatment. Nasal washings and ?aspirates are unacceptable for Xpert Xpress SARS-CoV-2/FLU/RSV ?testing. ? ?Fact Sheet for Patients: ?EntrepreneurPulse.com.au ? ?Fact Sheet for Healthcare Providers: ?IncredibleEmployment.be ? ?This test is not yet approved or cleared by the [place] FDA and ?has been authorized for detection and/or diagnosis of SARS-CoV-2 by ?FDA under an Emergency Use Authorization (EUA). This EUA will remain ?in effect (meaning this test can be used) for the duration of the ?COVID-19 declaration under Section 564(b)(1) of the Act, 21 U.S.C. ?section 360bbb-3(b)(1), unless the authorization is terminated or ?revoked. ? ?Performed at [facility], [place], ?[place] [address] ?  ?Culture, blood (routine x 2) Call MD if unable to obtain prior to antibiotics being given     Status: None (Preliminary result)  ? Collection Time: 02/14/2022  4:29 PM  ? Specimen: BLOOD  ?Result Value Ref Range Status  ? Specimen Description BLOOD BLOOD RIGHT HAND  Final  ? Special Requests   Final  ?  BOTTLES DRAWN AEROBIC AND ANAEROBIC Blood Culture adequate volume  ? Culture   Final  ?  NO GROWTH 2 DAYS ?Performed at [facility], [address], [place], [place] [address] ?  ? Report Status PENDING  Incomplete  ?Culture, blood (routine x 2) Call MD if unable to obtain prior to antibiotics being given     Status: None (Preliminary result)  ? Collection Time: 01/19/2022  5:54 PM  ? Specimen: BLOOD  ?Result Value Ref Range Status  ? Specimen Description BLOOD BLOOD RIGHT HAND  Final  ? Special Requests   Final  ?  BOTTLES DRAWN AEROBIC AND ANAEROBIC Blood Culture adequate volume  ? Culture   Final  ?  NO GROWTH 2 DAYS ?Performed at  [facility], [address], [place], [place] [address] ?  ? Report Status PENDING  Incomplete  ?MRSA Next Gen by PCR, Nasal     Status: None  ? Collection Time: 01/27/2022  6:03 PM  ? Specimen: Nasal Mucosa; Nasal Swab  ?Result Value Ref Range Status  ? MRSA by PCR Next Gen NOT DETECTED NOT DETECTED Final  ?  Comment: (NOTE) ?The GeneXpert MRSA Assay (FDA approved for NASAL specimens only), ?is one component of a comprehensive MRSA colonization surveillance ?program. It is not intended to diagnose MRSA infection nor to guide ?or monitor treatment for MRSA infections. ?Test performance is not FDA approved in patients less than 2 years ?old. ?Performed at [facility], [place], ?[place] [address] ?  ?Culture, Respiratory w Gram Stain     Status: None (Preliminary result)  ? Collection Time: 01/26/22 12:34 PM  ? Specimen: SPU  ?Result Value Ref Range Status  ? Specimen Description   Final  ?   SPUTUM ?Performed at [facility], [address], [place], [place] [address] ?  ? Special Requests   Final  ?  NONE ?Performed at [facility], [address] [place], [place] [address] ?  ? Gram Stain

## 2022-01-28 NOTE — Progress Notes (Signed)
PHARMACY CONSULT NOTE - FOLLOW UP ? ?Pharmacy Consult for Electrolyte Monitoring and Replacement  ? ?Recent Labs: ?Potassium (mmol/L)  ?Date Value  ?01/27/2022 3.2 (L)  ?07/24/2014 4.3  ? ?Magnesium (mg/dL)  ?Date Value  ?01/26/2022 1.8  ? ?Calcium (mg/dL)  ?Date Value  ?01/27/2022 8.0 (L)  ? ?Calcium, Total (mg/dL)  ?Date Value  ?07/24/2014 9.3  ? ?Albumin (g/dL)  ?Date Value  ?01/27/2022 1.7 (L)  ?05/11/2021 3.3 (L)  ? ?Phosphorus (mg/dL)  ?Date Value  ?01/27/2022 2.9  ? ?Sodium (mmol/L)  ?Date Value  ?01/27/2022 135  ?05/11/2021 140  ?07/24/2014 134 (L)  ? ? ?Assessment: ?[name] is a 61 y.o. male admitted on 01/31/2022 with pneumonia. PMH includes ESRD on Hemodialysis MWF, HFrEF (LVEF 30-35%), Hypertension, Hyperlipidemia, Peripheral Vascular Disease s/p bilateral BKA's, T2DM, Stroke, ICH, seizures, Crohn's Disease, BPH, Hidradenitis Suppurativa on Cosentyx. Pharmacy has been consulted for electrolytes. ?  ?Presented with becoming unresponsive while at dialysis with oxygen desaturation after 15 minutes of HD session. Previously noncompliant with HD sessions. Now admitted with volume overload and concern for possible pneumonia. Extubated 4/12. ? ?Goal of Therapy:  ?Electrolytes within normal limits ? ?Plan:  ?Refused labs this AM. ?Electrolytes ordered for 4/14 AM ? ? ?Wynelle Cleveland, PharmD ?Pharmacy Resident  ?01/28/2022 ?8:26 AM ?

## 2022-01-28 NOTE — Evaluation (Addendum)
Clinical/Bedside Swallow Evaluation ?Patient Details  ?Name: [name] ?MRN: [id number] ?Date of Birth: 1961-08-01 ? ?Today's Date: 01/28/2022 ?Time: SLP Start Time (ACUTE ONLY): 8882 SLP Stop Time (ACUTE ONLY): 1020 ?SLP Time Calculation (min) (ACUTE ONLY): 25 min ? ?Past Medical History:  ?Past Medical History:  ?Diagnosis Date  ? Acute metabolic encephalopathy 80/12/4915  ? Anemia   ? Cardiomyopathy ([place])   ? a. 08/2020 Echo: EF 40-45%, glbo HK, GrII DD; b. 04/2021 Echo: EF 30-35%, glob HK, mild LVH, GrII DD.  ? Coronary artery calcification seen on CT scan   ? Crohn disease ([place])   ? Diabetes mellitus without complication ([place])   ? DVT of lower extremity (deep venous thrombosis) ([place]) 2016  ? Empyema ([place]) 05/20/2017  ? Encephalopathy 12/04/2017  ? ESRD (end stage renal disease) on hemodyalisis ([place])   ? Fall at home, initial encounter 09/12/2020  ? HFrEF (heart failure with reduced ejection fraction) ([place])   ? a. 08/2020 Echo: EF 40-45%, glbo HK, mod conc LVH, GrII DD, Ao root 39m; b. 04/2021 Echo: EF 30-35%, glob HK, mild LVH, GrII DD, mod red RV fxn, nl PASP, sev dil LA, mild-mod MR, mod TR, mild-mod AoV sclerosis w/o stenosis.  ? Hidradenitis suppurativa   ? Hypertension   ? ICH (intracerebral hemorrhage) ([place]   ? Peritonitis ([place] 04/21/2017  ? Pyogenic arthritis of knee ([place] 02/04/2016  ? Sepsis ([place] 01/12/2018  ? Stroke ([facility]   ? ?Past Surgical History:  ?Past Surgical History:  ?Procedure Laterality Date  ? A/V FISTULAGRAM Left 02/24/2021  ? Procedure: A/V FISTULAGRAM;  Surgeon: SKatha Cabal MD;  [facility] [place];  Service: Cardiovascular;  Laterality: Left;  ? ABDOMINAL SURGERY    ? AMPUTATION FINGER Left 06/2019  ? PR AMPUTATION LONG FINGER/THUMB+FLAPS [facility]  ? [facility]   [facility]  ? BELOW KNEE LEG AMPUTATION Right 08/2017  ? [facility]  ? COLONOSCOPY    ? COLONOSCOPY WITH PROPOFOL N/A 10/28/2020  ? Procedure: COLONOSCOPY WITH PROPOFOL;  Surgeon: VLin Landsman MD;  [facility];  Service: Gastroenterology;  Laterality: N/A;  ? COLONOSCOPY WITH PROPOFOL N/A 11/21/2020  ? Procedure: COLONOSCOPY WITH PROPOFOL;  Surgeon: WLucilla Lame MD;  [facility];  Service: Endoscopy;  Laterality: N/A;  ? DIALYSIS/PERMA CATHETER INSERTION N/A 12/09/2017  ? Procedure: DIALYSIS/PERMA CATHETER INSERTION;  Surgeon: SKatha Cabal MD;  [place] [facility];  Service: Cardiovascular;  Laterality: N/A;  ? DIALYSIS/PERMA CATHETER INSERTION N/A 12/12/2017  ? Procedure: DIALYSIS/PERMA CATHETER INSERTION;  Surgeon: DAlgernon Huxley MD;  [place];  Service: Cardiovascular;  Laterality: N/A;  ? DIALYSIS/PERMA CATHETER REMOVAL Left 12/09/2017  ? Procedure: DIALYSIS/PERMA CATHETER REMOVAL;  Surgeon: SKatha Cabal MD;  [place] [facility];  Service: Cardiovascular;  Laterality: Left;  ? ESOPHAGOGASTRODUODENOSCOPY (EGD) WITH PROPOFOL N/A 11/20/2020  ? Procedure: ESOPHAGOGASTRODUODENOSCOPY (EGD) WITH PROPOFOL;  Surgeon: WLucilla Lame MD;  [facility];  Service: Endoscopy;  Laterality: N/A;  ? KNEE SURGERY Left 02/04/2016  ? [facility]  ? LEG AMPUTATION THROUGH LOWER TIBIA AND FIBULA Left 06/22/2018  ? [facility]  ? LOWER EXTREMITY ANGIOGRAPHY Right 08/08/2017  ? Procedure: Lower Extremity Angiography;  Surgeon: DAlgernon Huxley MD;  [place] [place];  Service: Cardiovascular;  Laterality: Right;  ? LOWER EXTREMITY ANGIOGRAPHY Right 08/22/2017  ? Procedure: Lower Extremity Angiography;  Surgeon: DAlgernon Huxley MD;  [place] [facility];  Service: Cardiovascular;  Laterality: Right;  ? LOWER EXTREMITY INTERVENTION  08/08/2017  ? Procedure: LOWER EXTREMITY INTERVENTION;  Surgeon: Algernon Huxley, MD;  Location: [place] [facility] LAB;  Service: Cardiovascular;;  ? LOWER EXTREMITY INTERVENTION  08/22/2017  ? Procedure: LOWER EXTREMITY INTERVENTION;  Surgeon: Algernon Huxley, MD;  Location: [place] [facility];   Service: Cardiovascular;;  ? ?HPI:  ?Per 29 H&P "[name] is a 61 y.o. male with medical history significant of ESRD-HD (MWF), hidradenitis suppurativa on [name], hypertension, hyperlipidemia, diabetes mellitus, stroke, peritonitis, ICH, DVT, s/p for bilateral BKA, Crohn's disease, anemia, seizure, PVD, mild cognitive impairment, sCHF with EF 30-35%, BPH, hidradenitis suppurativa on Cosentyx injection, presents with unresponsiveness.     Per report, pt went to dialysis center for his regular dialysis today, but he became unresponsive at about 15 minutes after started dialysis. Pt did not have any injury, particularly no head injury.  Patient was in chair the whole time. No seizure activity when EMS got there. Pt was noted to have oxygen desaturation to the 70s.  Initially nonrebreather was started but saturation in the 80s, then BiPAP was started in ED. Per report, patient had multiple secretions that had to be removed. When I saw pt, he already woke up. He is alert and oriented x3.  Moves all extremities.  No facial droop or slurred speech. Patient has cough, denies any chest pain. No nausea, vomiting, diarrhea or abdominal pain.  No symptoms of UTI."  ?Most recent CXR, 01/27/22, "1. Regressed mucous plugging and improving right lung ventilation since yesterday morning. Up to moderate residual right perihilar and basilar opacity. ?2.  Stable lines and tubes." ?  ?Assessment / Plan / Recommendation  ?Clinical Impression ? Pt seen for clinical swallowing evaluation.  Pt alert. Agitated at times. On 2L/min O2 via [place]. Congested, productive cough noted. Pt utilizing [place] to clear expectorating from oral cavity. Cleared with RN. ? ?Pt known to SLP services from admission in August 2022 which recommended a mech soft/regular diet with thin liquids.  ? ?Oral motor examination significant for reduced lingual ROM bilaterally (vs effort) and concern for reduced secretion management.  ? ?Pt presented with  trials of thin liquids (via straw), nectar-thick liquids (via tsp, cup, straw), pureed and solid. Pt presents with s/sx mild oral dysphagia c/b mildly prolonged mastication of solids likely due to mental status and exacerbated by mental status/participation. Pt with s/sx concerning for pharyngeal dysphagia with thin liquids via cup including multiple swallows palpation and immediate throat clearing with delayed expectoration of white, frothy material. Despite education/encouragement, pt declined trials of nectar-thick liquids via cup or straw sip. No overt s/sx concerning for pharyngeal dysphagia with nectar-thick liquids via tsp, pureed, or solid. No change to vocal quality or vital signs across trials. To palpation, pt with seemingly timely swallow initiation and seemingly adequate laryngeal elevation. Despite cueing/education, pt declined to self feed during evaluation. ? ?Recommend initiation of mech soft diet with nectar-thick liquids via teaspoon only. Safe swallowing strategies/aspiration precautions as outlined below.  ? ?SLP to f/u per POC for diet tolerance and trials of upgraded textures.  ? ?Pt and RN made aware of results, recommendations, and SLP POC. Pt verbalized understanding/agreement,  ?full understanding by pt. White board updated with diet recommendations and safe swallowing strategies/aspiration precautions for reinforcement by medical team.  ? ?SLP Visit Diagnosis: Dysphagia, oropharyngeal phase (R13.12) ?   ?Aspiration Risk ? Mild aspiration risk;Moderate aspiration risk  ?  ?Diet Recommendation Dysphagia 3 (Mech soft);Nectar-thick liquid (liquids  via teaspoon)  ? ?Liquid Administration via: Spoon ?Medication Administration: Crushed with puree ?Supervision: Staff to assist with self feeding;Full supervision/cueing for compensatory strategies ?Compensations: Minimize environmental distractions;Slow rate;Small sips/bites ?Postural Changes: Seated upright at 90 degrees;Remain upright for at  least 30 minutes after po intake  ?  ?Other  Recommendations Oral Care Recommendations: Staff/trained caregiver to provide oral care;Oral care QID ?Other Recommendations: Have oral suction available;Remove water pitch

## 2022-01-28 NOTE — TOC Progression Note (Signed)
Transition of Care (TOC) - Progression Note  ? ? ?Patient Details  ?Name: [name] ?MRN: [id number] ?Date of Birth: February 07, 1961 ? ?Transition of Care (TOC) CM/SW Contact  ?Shelbie Hutching, RN ?Phone Number: ?01/28/2022, 10:06 AM ? ?Clinical Narrative:    ?Patient extubated yesterday, tolerating [place] 2L.  Palliative care is following.   ? ? ?Expected Discharge Plan: [place] ?Barriers to Discharge: Continued Medical Work up ? ?Expected Discharge Plan and Services ?Expected Discharge Plan: [place] ?  ?Discharge Planning Services: CM Consult ?Post Acute Care Choice: Resumption of Svcs/PTA Provider, [place] ?Living arrangements for the past 2 months: [place] ?                ?DME Arranged: N/A ?DME Agency: NA ?  ?  ?  ?HH Arranged: NA ?[place] Agency: NA ?  ?  ?  ? ? ?Social Determinants of Health (SDOH) Interventions ?  ? ?Readmission Risk Interventions ? ?  11/09/2021  ?  3:37 PM 07/28/2021  ?  2:32 PM 02/10/2021  ?  9:31 AM  ?Readmission Risk Prevention Plan  ?Transportation Screening Complete Complete Complete  ?[place] or Home Care Consult   Complete  ?Palliative Care Screening   Complete  ?Medication Review Press photographer) Complete Complete Complete  ?PCP or Specialist appointment within 3-5 days of discharge  Complete   ?Palliative Care Screening Not Applicable Not Applicable   ?Skilled Nursing Facility Complete Not Applicable   ? ? ?

## 2022-01-28 NOTE — Progress Notes (Signed)
Transferred pt to room 259 with all belongings. Report given previously. Family is aware of the transfer.  ?

## 2022-01-28 NOTE — Progress Notes (Addendum)
? ?                                                                                                                                                     ?                                                   ?Daily Progress Note  ? ?Patient Name: [name]       Date: 01/28/2022 ?DOB: 02-09-1961  Age: 61 y.o. MRN#: [id number] ?Attending Physician: Ottie Glazier, MD ?Primary Care Physician: [name], MD ?Admit Date: 01/21/2022 ? ?Reason for Consultation/Follow-up: Establishing goals of care ? ?Subjective: ?Notes and labs reviewed. Spoke with staff; patient was placed in chair earlier and was not happy. Patient is resting in bed. No family at bedside. Patient is soft spoken. Discussed his status. Discussed his feelings. He states he wants to try to live. With deeper discussion, the decision is impacted by a previous conversation where someone painted a picture of horribly weeping edema, and suffocation if he stops dialysis. Upon discussing comfort care he understands this, and states "yes, hospice". He states he does not believe his wife would be okay with him shifting to comfort care. We discussed his poor PO intake at baseline and over 100 pound weight loss, and now a restricted diet. He confirms his weight loss and that he just enjoys eating chocolate chip cookies. He states he does not like the nectar thick liquids. He coughs and it sounds extremely congested. He states he would never want a feeding tube. We discussed aggressive care vs a comfort path. When we discussed CPR and resuscitative efforts vs dying peacefully and naturally, he states he would want to die peacefully and naturally and then states "no" when clarifying question was asked if he would want CPR to try to restart his heart. Asked him if he would ever want a breathing tube placed again, or if he would want to be placed back on the ventilator, the machine that breathes for you and he states "no." Clarified that even if it means death, he  would rather be comfortable until death rather than have a breathing tube placed in the future for any reason. He discusses that if his wife is on board with comfort care "I will need to then take it to God." ? ?Discussed talking with his wife. He requests that she come to bedside to talk to him privately. He advises he does not want to change code status formally until talking with her first. Per staff she is coming. Agreed that I would follow up tomorrow.    ? ?Length of Stay: 3 ? ?Current Medications: ?Scheduled  Meds:  ? amLODipine  10 mg Oral QPM  ? vitamin C  500 mg Oral BID  ? atorvastatin  80 mg Oral QHS  ? carvedilol  25 mg Oral BID WC  ? chlorhexidine  15 mL Mouth Rinse BID  ? Chlorhexidine Gluconate Cloth  6 each Topical Q0600  ? Chlorhexidine Gluconate Cloth  6 each Topical Q0600  ? cloNIDine  0.1 mg Oral BID  ? docusate sodium  100 mg Oral BID  ? feeding supplement (NEPRO CARB STEADY)  237 mL Oral TID BM  ? gabapentin  100 mg Oral QHS  ? heparin  5,000 Units Subcutaneous Q8H  ? hydrALAZINE  100 mg Oral Q8H  ? [START ON 01/29/2022] HYDROcodone-acetaminophen  1 tablet Oral Daily  ? insulin aspart  0-6 Units Subcutaneous Q4H  ? ipratropium-albuterol  3 mL Nebulization Q4H  ? isosorbide dinitrate  15 mg Oral BID  ? [START ON 01/29/2022] levETIRAcetam  750 mg Oral Daily  ? [START ON 01/29/2022] lisinopril  40 mg Oral Daily  ? mouth rinse  15 mL Mouth Rinse q12n4p  ? [START ON 01/29/2022] multivitamin with minerals  1 tablet Oral Daily  ? pantoprazole (PROTONIX) IV  40 mg Intravenous Q12H  ? [START ON 01/29/2022] polyethylene glycol  17 g Oral Daily  ? [START ON 01/29/2022] sevelamer carbonate  800 mg Oral 2 times per day on Mon Wed Fri  ? sevelamer carbonate  800 mg Oral 3 times per day on Sun Tue Thu Sat  ? ? ?Continuous Infusions: ? sodium chloride    ? sodium chloride    ? ampicillin-sulbactam (UNASYN) IV Stopped (01/27/22 2038)  ? ? ?PRN Meds: ?sodium chloride, sodium chloride, acetaminophen (TYLENOL) oral  liquid 160 mg/5 mL, albuterol, alteplase, dextromethorphan-guaiFENesin, fentaNYL (SUBLIMAZE) injection, heparin, hydrALAZINE, lidocaine (PF), lidocaine-prilocaine, LORazepam, midazolam, ondansetron (ZOFRAN) IV, pentafluoroprop-tetrafluoroeth ? ?Physical Exam ?Pulmonary:  ?   Effort: Pulmonary effort is normal.  ?   Comments: Congested cough. ?Neurological:  ?   Mental Status: He is alert.  ?         ? ?Vital Signs: BP (!) 155/63   Pulse 94   Temp 97.6 ?F (36.4 ?C)   Resp 19   Ht 5' 11"  (1.803 m)   Wt 73.8 kg   SpO2 99%   BMI 22.69 kg/m?  ?SpO2: SpO2: 99 % ?O2 Device: O2 Device: Room Air ?O2 Flow Rate: O2 Flow Rate (L/min): 2 L/min ? ?Intake/output summary:  ?Intake/Output Summary (Last 24 hours) at 01/28/2022 1444 ?Last data filed at 01/27/2022 2200 ?Gross per 24 hour  ?Intake 100 ml  ?Output 501 ml  ?Net -401 ml  ? ?LBM: Last BM Date : 01/18/2022 ?Baseline Weight: Weight: 75.3 kg ?Most recent weight: Weight: 73.8 kg ? ?     ? ?Patient Active Problem List  ? Diagnosis Date Noted  ? Unresponsiveness 02/07/2022  ? Mucus plugging of bronchi 02/11/2022  ? HLD (hyperlipidemia) 02/14/2022  ? Elevated troponin 01/17/2022  ? Multifocal pneumonia   ? Type 2 diabetes mellitus with diabetic neuropathy, without long-term current use of insulin ([place]) 07/27/2021  ? Anemia in ESRD (end-stage renal disease) ([place]) 07/27/2021  ? Hypoglycemia 07/27/2021  ? Hypothermia 07/27/2021  ? COVID-19 virus infection 07/27/2021  ? Aspiration pneumonia ([place]) 07/27/2021  ? Unstageable pressure ulcer of sacral region ([place]) 05/22/2021  ? Acute decompensated heart failure ([place]) 05/19/2021  ? Acute respiratory failure with hypoxia ([place]) 05/18/2021  ? Protein-calorie malnutrition, severe 05/14/2021  ? Acute pulmonary edema ([facility])   ?  Shortness of breath   ? Fluid overload 05/12/2021  ? Cellulitis and abscess of buttock   ? History of GI bleed 02/06/2021  ? Long term current use of immunosuppressive drug 02/06/2021  ? Disorder of skin due to Crohn's  disease ([place]) 02/06/2021  ? Complication of vascular access for dialysis 01/22/2021  ? Polyp of transverse colon   ? Acute metabolic encephalopathy 74/16/3845  ? Acute on chronic anemia 11/18/2020  ? Chronic systolic CHF (congestive heart failure) ([place]) 11/18/2020  ? Cerebrovascular disease 09/12/2020  ? Transient alteration of awareness 09/12/2020  ? Hyperkalemia 09/12/2020  ? Mild cognitive impairment 09/30/2019  ? Peripheral vascular disease ([place]) 06/28/2018  ? Sepsis ([place]) 01/12/2018  ? Pressure injury of skin 12/04/2017  ? S/P bilateral BKA (below knee amputation) ([place]) 10/20/2017  ? Atherosclerosis of native arteries of extremity with rest pain ([place]) 08/05/2017  ? History of CVA (cerebrovascular accident) 04/15/2017  ? Seizure disorder ([place]) 04/15/2017  ? ESRD (end stage renal disease) ([place]) 04/12/2016  ? Aphthae 02/20/2016  ? Hidradenitis suppurativa 02/20/2016  ? Leg pain 02/20/2016  ? Neuropathy 02/20/2016  ? Narrowing of intervertebral disc space 08/29/2015  ? Vascular disorder of lower extremity 08/29/2015  ? Failure of erection 08/29/2015  ? Hyperlipidemia 08/29/2015  ? Essential hypertension 08/29/2015  ? Anemia due to chronic kidney disease 05/26/2015  ? Venous insufficiency of leg 09/04/2014  ? History of deep vein thrombosis (DVT) of lower extremity 08/16/2014  ? Prostatic intraepithelial neoplasia 11/02/2013  ? Elevated prostate specific antigen (PSA) 09/11/2013  ? Benign prostatic hyperplasia with urinary obstruction 08/13/2013  ? Spermatocele 08/13/2013  ? Avitaminosis D 01/25/2013  ? Type 2 diabetes mellitus with hypoglycemia without coma ([place]) 03/28/2012  ? Crohn disease ([place]) 08/03/2011  ? ? ?Palliative Care Assessment & Plan  ? ?Recommendations/Plan: ?Patient does not want feeding tube, resuscitation or to be intubated in the future. Wants to talk with his wife before making changes. Considering feelings on comfort care, and states if his wife is on board "I will need to then take it to  God." ?PMT will follow. ? ?Code Status: ? ?  ?Code Status Orders  ?(From admission, onward)  ?  ? ? ?  ? ?  Start     Ordered  ? 02/02/2022 1606  Full code  Continuous       ? 01/31/2022 1606  ? ?  ?  ? ?  ? ?Code Status H

## 2022-01-28 NOTE — Consult Note (Addendum)
[facility] Face-to-Face Psychiatry Consult  ? ?Reason for Consult:  Depression ?Referring Physician:  Lanney [name] ?Patient Identification: [name] ?MRN:  [id number] ?Principal Diagnosis: Unresponsiveness ?Diagnosis:  Principal Problem: ?  Unresponsiveness ?Active Problems: ?  Essential hypertension ?  Crohn disease ([place]) ?  Hidradenitis suppurativa ?  ESRD (end stage renal disease) ([place]) ?  History of CVA (cerebrovascular accident) ?  Seizure disorder ([place]) ?  Chronic systolic CHF (congestive heart failure) ([place]) ?  Acute respiratory failure with hypoxia ([place]) ?  Anemia in ESRD (end-stage renal disease) ([place]) ?  Mucus plugging of bronchi ?  HLD (hyperlipidemia) ?  Elevated troponin ? ? ?Total Time spent with patient: 30 minutes ? ?Subjective: "I do get depressed." ?[name] is a 61 y.o. male patient admitted with multiple medical diagnoses as listed above. ? ?HPI: Chart was reviewed.  Patient is a resident of skilled nursing facility, [facility].  He is nonambulatory, bilateral below-knee amputation.  He was admitted to 4 medical management of diagnoses noted above. ? ?Patient was seen in the ICU, with wife at bedside.  Wife is very interactive in his care.  Patient was alert and oriented x3.  He was awake and answering questions appropriately.  Very weak voice but appeared to understand questions as they were asked.  Patient clearly states that he is not suicidal, but says that he is depressed.  He denies doing anything in the past to harm himself.  Discussed medication that may be helpful for his depression, as it is understood that he is in chronic pain which certainly can have an impact on his mood.  At first, wife stated "he does not want anything for depression., because I don't know that he is depressed."  But when asked directly, patient does say that he is depressed.  Discussed medication options for antidepressant.  Both indicate that he is on so many medicines right now that they have to think  about it.  Discussed trying Remeron at a very low dose at night, as patient is very thin and has poor appetite, and this can be effective.  Wife states that he has had that before and does not remember if it was helpful.  She encourages patient to try it because she is concerned that his appetite has decreased.  Patient agrees to have it prescribed, knowing that he can refuse it if he decides he does not want to take it.  Wife agrees with this. ? ?Past Psychiatric History: Denies ? ?Risk to Self:   ?Risk to Others:   ?Prior Inpatient Therapy:   ?Prior Outpatient Therapy:   ? ?Past Medical History:  ?Past Medical History:  ?Diagnosis Date  ? Acute metabolic encephalopathy 35/00/9381  ? Anemia   ? Cardiomyopathy ([place])   ? a. 08/2020 Echo: EF 40-45%, glbo HK, GrII DD; b. 04/2021 Echo: EF 30-35%, glob HK, mild LVH, GrII DD.  ? Coronary artery calcification seen on CT scan   ? Crohn disease ([place])   ? Diabetes mellitus without complication ([place])   ? DVT of lower extremity (deep venous thrombosis) ([place]) 2016  ? Empyema ([place]) 05/20/2017  ? Encephalopathy 12/04/2017  ? ESRD (end stage renal disease) on hemodyalisis ([place])   ? Fall at home, initial encounter 09/12/2020  ? HFrEF (heart failure with reduced ejection fraction) ([place])   ? a. 08/2020 Echo: EF 40-45%, glbo HK, mod conc LVH, GrII DD, Ao root 51m; b. 04/2021 Echo: EF 30-35%, glob HK, mild LVH, GrII DD, mod red  RV fxn, nl PASP, sev dil LA, mild-mod MR, mod TR, mild-mod AoV sclerosis w/o stenosis.  ? Hidradenitis suppurativa   ? Hypertension   ? ICH (intracerebral hemorrhage) ([place])   ? Peritonitis ([place]) 04/21/2017  ? Pyogenic arthritis of knee ([place]) 02/04/2016  ? Sepsis ([place]) 01/12/2018  ? Stroke [facility])   ?  ?Past Surgical History:  ?Procedure Laterality Date  ? A/V FISTULAGRAM Left 02/24/2021  ? Procedure: A/V FISTULAGRAM;  Surgeon: Katha Cabal, MD;  Location: [place] [facility];  Service: Cardiovascular;  Laterality: Left;  ? ABDOMINAL SURGERY    ? AMPUTATION  FINGER Left 06/2019  ? PR AMPUTATION LONG FINGER/THUMB+FLAPS [facility]  ? ANGIOPLASTY Left   ? left fem-pop at [facility] 04-11-2018  ? BELOW KNEE LEG AMPUTATION Right 08/2017  ? [facility]  ? COLONOSCOPY    ? COLONOSCOPY WITH PROPOFOL N/A 10/28/2020  ? Procedure: COLONOSCOPY WITH PROPOFOL;  Surgeon: Lin Landsman, MD;  Location: [facility] ENDOSCOPY;  Service: Gastroenterology;  Laterality: N/A;  ? COLONOSCOPY WITH PROPOFOL N/A 11/21/2020  ? Procedure: COLONOSCOPY WITH PROPOFOL;  Surgeon: [name], MD;  Location: [facility] ENDOSCOPY;  Service: Endoscopy;  Laterality: N/A;  ? DIALYSIS/PERMA CATHETER INSERTION N/A 12/09/2017  ? Procedure: DIALYSIS/PERMA CATHETER INSERTION;  Surgeon: Katha Cabal, MD;  Location: [place] [facility];  Service: Cardiovascular;  Laterality: N/A;  ? DIALYSIS/PERMA CATHETER INSERTION N/A 12/12/2017  ? Procedure: DIALYSIS/PERMA CATHETER INSERTION;  Surgeon: Algernon Huxley, MD;  Location: [place] [facility];  Service: Cardiovascular;  Laterality: N/A;  ? DIALYSIS/PERMA CATHETER REMOVAL Left 12/09/2017  ? Procedure: DIALYSIS/PERMA CATHETER REMOVAL;  Surgeon: Katha Cabal, MD;  Location: [place] [facility];  Service: Cardiovascular;  Laterality: Left;  ? ESOPHAGOGASTRODUODENOSCOPY (EGD) WITH PROPOFOL N/A 11/20/2020  ? Procedure: ESOPHAGOGASTRODUODENOSCOPY (EGD) WITH PROPOFOL;  Surgeon: [name], MD;  Location: [facility] ENDOSCOPY;  Service: Endoscopy;  Laterality: N/A;  ? KNEE SURGERY Left 02/04/2016  ? [facility]  ? LEG AMPUTATION THROUGH LOWER TIBIA AND FIBULA Left 06/22/2018  ? [facility]  ? LOWER EXTREMITY ANGIOGRAPHY Right 08/08/2017  ? Procedure: Lower Extremity Angiography;  Surgeon: Algernon Huxley, MD;  Location: [place] [facility];  Service: Cardiovascular;  Laterality: Right;  ? LOWER EXTREMITY ANGIOGRAPHY Right 08/22/2017  ? Procedure: Lower Extremity Angiography;  Surgeon: Algernon Huxley, MD;  Location: [place] [facility];  Service: Cardiovascular;  Laterality: Right;  ? LOWER EXTREMITY INTERVENTION  08/08/2017   ? Procedure: LOWER EXTREMITY INTERVENTION;  Surgeon: Algernon Huxley, MD;  Location: [place] [facility];  Service: Cardiovascular;;  ? LOWER EXTREMITY INTERVENTION  08/22/2017  ? Procedure: LOWER EXTREMITY INTERVENTION;  Surgeon: Algernon Huxley, MD;  Location: [place] [facility];  Service: Cardiovascular;;  ? ?Family History:  ?Family History  ?Problem Relation Age of Onset  ? Irritable bowel syndrome Sister   ? Diabetes Sister   ? Heart disease Mother   ? Diabetes Mother   ? Heart disease Father   ? Rheumatic fever Father   ?     as child  ? Psoriasis Brother   ? Arthritis Brother   ? Diabetes Sister   ? Diabetes Sister   ? ?Family Psychiatric  History: None noted ?Social History:  ?Social History  ? ?Substance and Sexual Activity  ?Alcohol Use No  ?   ?Social History  ? ?Substance and Sexual Activity  ?Drug Use Yes  ? Types: Marijuana  ?  ?Social History  ? ?Socioeconomic History  ? Marital status: Married  ?  Spouse name: Not on file  ? Number of children: 2  ? Years of education: Not on file  ? Highest education level: Associate degree: occupational, Hotel manager, or vocational program  ?Occupational History  ? Occupation: disable  ?Tobacco Use  ? Smoking status: Former  ? Smokeless tobacco: Never  ? Tobacco comments:  ?  smokes marijuana  ?Vaping Use  ? Vaping Use: Never used  ?Substance and Sexual Activity  ? Alcohol use: No  ? Drug use: Yes  ?  Types: Marijuana  ? Sexual activity: Not Currently  ?Other Topics Concern  ? Not on file  ?Social History Narrative  ? Not on file  ? ?Social Determinants of Health  ? ?Financial Resource Strain: Low Risk   ? Difficulty of Paying Living Expenses: Not hard at all  ?Food Insecurity: No Food Insecurity  ? Worried About Charity fundraiser in the Last Year: Never true  ? Ran Out of Food in the Last Year: Never true  ?Transportation Needs: No Transportation Needs  ? Lack of Transportation (Medical): No  ? Lack of Transportation (Non-Medical): No  ?Physical Activity: Inactive  ?  Days of Exercise per Week: 0 days  ? Minutes of Exercise per Session: 0 min  ?Stress: No Stress Concern Present  ? Feeling of Stress : Not at all  ?Social Connections: Moderately Isolated  ? Frequency of

## 2022-01-28 NOTE — Progress Notes (Signed)
? ?NAME:  [name], MRN:  [id number], DOB:  1960/10/26, LOS: 3 ?ADMISSION DATE:  02/07/2022, CONSULTATION DATE:  02/09/2022 ?REFERRING MD:  Dr. Blaine Hamper, CHIEF COMPLAINT:  Unresponsiveness, Hypoxia  ? ?Brief Pt Description / Synopsis:  ?61 y.o. Male admitted with Acute Hypoxic and Hypercapnic Respiratory Failure in the setting of Volume Overload/Pulmonary Edema, Mucus Plugging, and possible Pneumonia requiring BiPAP. ? ?History of Present Illness:  ?[name] is a 61 year old male with a significant past medical history as listed below who presented to [facility] ED on 02/01/2022 from his outpatient hemodialysis center due to unresponsiveness and hypoxia. ? ?Patient is currently lethargic and BiPAP mask is in place, therefore history is obtained from patient's spouse [name] at bedside, along with chart review.  Reportedly patient went to dialysis for his regular scheduled dialysis today, but about 15 minutes into the session, he became unresponsive and was noted to be hypoxic with O2 saturations in the 70s.  He was noted to have multiple secretions that had to be suctioned.  EMS placed nonrebreather mask with improvement of O2 sats into the 80s.  Upon arrival to the ED he was placed on BiPAP.  His mental status improved on BiPAP and no focal neuro deficits were noted. ?01/27/22 - patient extubated to BIPAP in no distress and receiving dialysis.  ?01/28/22- patient being optimzied for TRH transfer, currently requiring BIPAP support.  He is non-compliant with labwork and therapy.  Will order psych evalaution - suspect severe MDD due to medical disease. ? ? ?Pertinent  Medical History  ?ESRD on Hemodialysis ?HFrEF (LVEF 30-35%) ?Hypertension ?Hyperlipidemia ?Peripheral Vascular Disease s/p bilateral BKA's ?Diabetes Mellitus ?Stroke ?ICH ?Seizure ?DVT ?Crohn's Disease ?Peritonitis ?BPH ?Anemia ?Hidradenitis Suppurativa on [name] ? ?Micro Data:  ?4/10: SARS-CoV-2 & Influenza PCR>>negative ?4/10: Blood culture>> ?4/10:  Sputum>> ?4/10: Strep pneumo urinary antigen>> ?4/10: Legionella urinary antigen>> ? ?Antimicrobials:  ?Azithromycin 4/10>> ?Ceftriaxone 4/10>> ? ?Significant Hospital Events: ?Including procedures, antibiotic start and stop dates in addition to other pertinent events   ?4/10: Admitted to stepdown by the hospitalist.  PCCM asked for pulmonary consult due to mucous plugging and possible need for bronchoscopy ? ?Interim History / Subjective:  ?-Patient met about the hospitalist ?-Currently on BiPAP and tolerating, looks comfortable, able arouse to voice and follows simple commands ?-PCCM consulted due to right mucous plugging and possible need for bronchoscopy ?-will implement conservative measures and aggressive pulmonary toilet for now and reevaluate with chest x-ray in the morning ?-4/11 - patient has not had improvement with non invasive modalities an dremains with collapse of right lung hypoxemic. Plan for bronchoscopy today ? ?Objective   ?Blood pressure (!) 149/53, pulse 71, temperature 97.8 ?F (36.6 ?C), temperature source Axillary, resp. rate 19, height _0  (1.803 m), weight 73.8 kg, SpO2 95 %. ?   ?Vent Mode: PRVC ?FiO2 (%):  [30 %] 30 % ?Set Rate:  [18 bmp] 18 bmp ?Vt Set:  [400 mL] 400 mL ?PEEP:  [5 cmH20] 5 cmH20  ? ?Intake/Output Summary (Last 24 hours) at 01/28/2022 0933 ?Last data filed at 01/27/2022 2200 ?Gross per 24 hour  ?Intake 100 ml  ?Output 501 ml  ?Net -401 ml  ? ? ?Filed Weights  ? 01/24/2022 2246 01/26/22 0115 01/26/22 1500  ?Weight: 75.3 kg 73.8 kg 73.8 kg  ? ? ?Examination: ?General: Acute on chronically ill-appearing frail male, sitting in bed, on BiPAP, no acute distress ?HENT: Atraumatic, normocephalic, neck supple, no JVD ?Lungs: Coarse breath sounds throughout with right lower field diminished, BiPAP  assisted, even, nonlabored ?Cardiovascular: Bradycardia, regular rhythm, S1-S2, no murmurs, rubs, gallop ?Abdomen: Soft, nontender, nondistended, no guarding rebound tenderness, bowel  sounds positive x4 ?Extremities: Bilateral BKA's well-healed, no cyanosis or clubbing ?Neuro: Lethargic, arouses easily to voice, moves all extremities to command, no focal deficits, difficult to assess orientation due to BiPAP ?GU: Deferred ? ?Resolved Hospital Problem list   ? ? ?Assessment & Plan:  ? ?Acute Hypoxic and Hypercapnic Respiratory Failure in the setting of Volume Overload/Pulmonary Edema, Mucus Plugging, and suspected Pneumonia ?-Supplemental O2 as needed to maintain O2 sats >92% ?-BiPAP, wean as tolerated ?-Follow intermittent Chest X-ray & ABG as needed ?-Bronchodilators ?-ABX as above ?-Volume removal with Hemodialysis ?-Discussed with Dr. Lanney [name] ~ will implement Aggressive Pulmonary toilet as able (Incentive spirometry/flutter valve, chest PT) with follow up CXR in the morning to see if Bronchoscopy is needed ? ?Acute on Chronic HFrEF (LVEF 30-35%) ?Mildly elevated troponin, suspect demand ischemia ?Hypertension ?-Continuous cardiac monitoring ?-Maintain MAP >65 ?-BNP >4500 ?-Volume removal with Hemodialysis ?-HS Troponin peaked at 19 ?-Lactic acid is normal at 0.9 ?-Echocardiogram 05/13/21: LVEF 30-35%, Grade II diastolic dysfunction, moderately reduced RV systolic function, mild to moderate Mitral regurgitation, moderate tricuspid regurgitation ?-Resume home antihypertensives when able to tolerate p.o. ? ?Concern for possible Pneumonia ?Meet SIRS criteria upon admission (RR 22, WBC 12.6) ?-Monitor fever curve ?-Trend WBC's & Procalcitonin ?-Follow cultures as above ?-Continue empiric Azithromycin & Ceftriaxone pending cultures & sensitivities ? ?ESRD on Hemodialysis ?Mild Hypokalemia ?-Monitor I&O's / urinary output ?-Follow BMP ?-Ensure adequate renal perfusion ?-Avoid nephrotoxic agents as able ?-Replace electrolytes as indicated ?-Nephrology consulted, appreciate input ~ HD as per Nephrology ? ?Acute Metabolic Encephalopathy, suspect due to Hypoxia ?PMHx: Stroke, Seizure disorder ?-Provide  supportive care ?-Treatment of Hypoxia as outlined above ?-Avoid sedating meds ?-Continue home Keppra ? ?Anemia of Chronic Disease ?-Monitor for S/Sx of bleeding ?-Trend CBC ?-Heparin SQ for VTE Prophylaxis  ?-Transfuse for Hgb <7 ? ?Diabetes Mellitus ?-CBG's q4h; Target range of 140 to 180 ?-SSI ?-Follow ICU Hypo/Hyperglycemia protocol ? ? ? ? ? ?Best Practice (right click and "Reselect all SmartList Selections" daily)  ? ?Diet/type: NPO while on BiPAP ?DVT prophylaxis: prophylactic heparin  ?GI prophylaxis: N/A ?Lines: N/A ?Foley:  N/A ?Code Status:  full code ?Last date of multidisciplinary goals of care discussion [N/A] ? ?Updated pt's spouse [name] at bedside 4/10.  All questions answered to her satisfaction. ? ?Labs   ?CBC: ?Recent Labs  ?Lab 02/14/2022 ?1333 01/26/22 ?0434 01/27/22 ?0940  ?WBC 12.6* 10.0 7.8  ?NEUTROABS 10.0*  --   --   ?HGB 10.6* 9.5* 8.7*  ?HCT 37.9* 33.0* 31.0*  ?MCV 90.2 88.5 88.6  ?PLT 202 164 113*  ? ? ? ?Basic Metabolic Panel: ?Recent Labs  ?Lab 01/29/2022 ?1333 01/26/22 ?0434 01/26/22 ?9562 01/27/22 ?0940  ?NA 137 138  --  135  ?K 3.4* 3.1*  --  3.2*  ?CL 96* 98  --  99  ?CO2 31 30  --  27  ?GLUCOSE 158* 74  --  72  ?BUN 30* 20  --  16  ?CREATININE 4.13* 2.92*  --  2.47*  ?CALCIUM 8.3* 8.3*  --  8.0*  ?MG  --   --  1.8  --   ?PHOS  --  2.9  --  2.9  ? ? ?GFR: ?Estimated Creatinine Clearance: 33.6 mL/min (A) (by C-G formula based on SCr of 2.47 mg/dL (H)). ?Recent Labs  ?Lab 01/22/2022 ?1333 01/23/2022 ?1628 01/22/2022 ?1942 01/26/22 ?1308 01/27/22 ?0236  01/27/22 ?0940  ?PROCALCITON 0.33  --   --  1.11 1.61  --   ?WBC 12.6*  --   --  10.0  --  7.8  ?LATICACIDVEN  --  0.9 1.2  --   --   --   ? ? ? ?Liver Function Tests: ?Recent Labs  ?Lab 01/27/2022 ?1333 01/27/22 ?0940  ?AST 16  --   ?ALT 8  --   ?ALKPHOS 87  --   ?BILITOT 0.7  --   ?PROT 7.8  --   ?ALBUMIN 2.1* 1.7*  ? ? ?No results for input(s): LIPASE, AMYLASE in the last 168 hours. ?No results for input(s): AMMONIA in the last 168  hours. ? ?ABG ?   ?Component Value Date/Time  ? PHART 7.41 04/13/2017 1455  ? PCO2ART 36 04/13/2017 1455  ? PO2ART 53 (L) 04/13/2017 1455  ? HCO3 35.7 (H) 02/08/2022 1334  ? TCO2 25 09/12/2020 1119  ? ACIDBASEDEF 1.5 04/14/19

## 2022-01-29 ENCOUNTER — Encounter: Payer: Self-pay | Admitting: Internal Medicine

## 2022-01-29 DIAGNOSIS — F320 Major depressive disorder, single episode, mild: Secondary | ICD-10-CM | POA: Diagnosis not present

## 2022-01-29 DIAGNOSIS — Z992 Dependence on renal dialysis: Secondary | ICD-10-CM | POA: Diagnosis not present

## 2022-01-29 DIAGNOSIS — D631 Anemia in chronic kidney disease: Secondary | ICD-10-CM | POA: Diagnosis not present

## 2022-01-29 DIAGNOSIS — I5022 Chronic systolic (congestive) heart failure: Secondary | ICD-10-CM | POA: Diagnosis not present

## 2022-01-29 DIAGNOSIS — N186 End stage renal disease: Secondary | ICD-10-CM | POA: Diagnosis not present

## 2022-01-29 DIAGNOSIS — L732 Hidradenitis suppurativa: Secondary | ICD-10-CM | POA: Diagnosis not present

## 2022-01-29 DIAGNOSIS — J9601 Acute respiratory failure with hypoxia: Secondary | ICD-10-CM | POA: Diagnosis not present

## 2022-01-29 DIAGNOSIS — Z7189 Other specified counseling: Secondary | ICD-10-CM | POA: Diagnosis not present

## 2022-01-29 LAB — AEROBIC/ANAEROBIC CULTURE W GRAM STAIN (SURGICAL/DEEP WOUND)
Gram Stain: NONE SEEN
Gram Stain: NONE SEEN

## 2022-01-29 LAB — BASIC METABOLIC PANEL
Anion gap: 9 (ref 5–15)
BUN: 22 mg/dL — ABNORMAL HIGH (ref 6–20)
CO2: 30 mmol/L (ref 22–32)
Calcium: 8.6 mg/dL — ABNORMAL LOW (ref 8.9–10.3)
Chloride: 98 mmol/L (ref 98–111)
Creatinine, Ser: 2.77 mg/dL — ABNORMAL HIGH (ref 0.61–1.24)
GFR, Estimated: 26 mL/min — ABNORMAL LOW (ref >60)
Glucose, Bld: 115 mg/dL — ABNORMAL HIGH (ref 70–99)
Potassium: 3.5 mmol/L (ref 3.5–5.1)
Sodium: 137 mmol/L (ref 135–145)

## 2022-01-29 LAB — CBC
HCT: 30.4 % — ABNORMAL LOW (ref 39.0–52.0)
Hemoglobin: 8.7 g/dL — ABNORMAL LOW (ref 13.0–17.0)
MCH: 25.2 pg — ABNORMAL LOW (ref 26.0–34.0)
MCHC: 28.6 g/dL — ABNORMAL LOW (ref 30.0–36.0)
MCV: 88.1 fL (ref 80.0–100.0)
Platelets: 155 10*3/uL (ref 150–400)
RBC: 3.45 MIL/uL — ABNORMAL LOW (ref 4.22–5.81)
RDW: 21 % — ABNORMAL HIGH (ref 11.5–15.5)
WBC: 7.9 10*3/uL (ref 4.0–10.5)
nRBC: 0 % (ref 0.0–0.2)

## 2022-01-29 LAB — GLUCOSE, CAPILLARY
Glucose-Capillary: 106 mg/dL — ABNORMAL HIGH (ref 70–99)
Glucose-Capillary: 136 mg/dL — ABNORMAL HIGH (ref 70–99)
Glucose-Capillary: 152 mg/dL — ABNORMAL HIGH (ref 70–99)
Glucose-Capillary: 95 mg/dL (ref 70–99)
Glucose-Capillary: 98 mg/dL (ref 70–99)

## 2022-01-29 MED ORDER — ACETAMINOPHEN 160 MG/5ML PO SOLN
650.0000 mg | Freq: Four times a day (QID) | ORAL | Status: DC | PRN
  Administered 2022-01-30: 650 mg via ORAL
  Filled 2022-01-29 (×2): qty 20.3

## 2022-01-29 MED ORDER — EPOETIN ALFA 10000 UNIT/ML IJ SOLN: 10000.0000 [IU] | INTRAMUSCULAR | Status: DC

## 2022-01-29 MED ORDER — INSULIN ASPART 100 UNIT/ML IJ SOLN: 0.0000 [IU] | Freq: Three times a day (TID) | INTRAMUSCULAR | Status: DC

## 2022-01-29 MED ORDER — LINEZOLID 600 MG PO TABS
600.0000 mg | ORAL_TABLET | Freq: Two times a day (BID) | ORAL | Status: DC
  Administered 2022-01-29: 600 mg via ORAL
  Filled 2022-01-29 (×2): qty 1

## 2022-01-29 MED ORDER — IPRATROPIUM-ALBUTEROL 0.5-2.5 (3) MG/3ML IN SOLN
3.0000 mL | Freq: Three times a day (TID) | RESPIRATORY_TRACT | Status: DC
Start: 2022-01-29 — End: 2022-01-31
  Administered 2022-01-29 – 2022-01-31 (×7): 3 mL via RESPIRATORY_TRACT
  Filled 2022-01-29 (×8): qty 3

## 2022-01-29 NOTE — Progress Notes (Addendum)
Pt refused BIPAP even with education. Pt was palced back on 2 liters [place] respiratory made aware. Will continue to monitor/. ?

## 2022-01-29 NOTE — Progress Notes (Signed)
?[facility]  ?ROUNDING NOTE  ? ?Subjective:  ? ?Cassey B Kwasny is a 61 year old male with past medical history including seizures, anemia, bilateral BKA, hypertension, hyperlipidemia, mild cognitive impairment, CHF with EF 30 to 35%, BPH, hydradenitis suppurativa, and end-stage renal disease on hemodialysis.  Patient presents to the emergency department after being found unresponsive at dialysis clinic.  Patient has been admitted for ESRD (end stage renal disease) ([place]) [N18.6] ?Acute respiratory failure with hypoxia ([place]) [J96.01] ?Unresponsiveness [R41.89] ? ?Patient is known to our practice and receives outpatient dialysis treatments at [facility] on a Monday Wednesday Friday schedule, supervised by [facility] physicians.   ? ?Update:   ?Patient appears quite weak. ?Did undergo hemodialysis treatment successfully today. ?Resting comfortably. ? ? ?Objective:  ?Vital signs in last 24 hours:  ?Temp:  [98 ?F (36.7 ?C)-98.9 ?F (37.2 ?C)] 98 ?F (36.7 ?C) (04/14 1326) ?Pulse Rate:  [70-79] 77 (04/14 1326) ?Resp:  [16-25] 18 (04/14 1326) ?BP: (104-166)/(47-103) 144/69 (04/14 1326) ?SpO2:  [90 %-100 %] 100 % (04/14 1326) ?Weight:  [54.8 kg-59.7 kg] 54.8 kg (04/14 1245) ? ?Weight change:  ?Filed Weights  ? 01/29/22 0352 01/29/22 0849 01/29/22 1245  ?Weight: 59.7 kg 56.1 kg 54.8 kg  ? ? ?Intake/Output: ?I/O last 3 completed shifts: ?In: 100 [IV Piggyback:100] ?Out: 501 [Other:501] ?  ?Intake/Output this shift: ? Total I/O ?In: -  ?Out: 1000 [Other:1000] ? ?Physical Exam: ?General: NAD  ?Head: Normocephalic, atraumatic. Moist oral mucosal membranes  ?Eyes: Anicteric  ?Lungs:  Scattered rhonchi, normal effort  ?Heart: Regular rate and rhythm  ?Abdomen:  Soft, nontender, bowel sounds present  ?Extremities: No peripheral edema. LE amputations  ?Neurologic: Awake, alert, will follow simple commands  ?Skin: No lesions  ?Access: Left aVF  ? ? ?Basic Metabolic Panel: ?Recent Labs  ?Lab 02/13/2022 ?1333 01/26/22 ?0434  01/26/22 ?9675 01/27/22 ?9163 01/28/22 ?2154 01/29/22 ?8466  ?NA 137 138  --  135 136 137  ?K 3.4* 3.1*  --  3.2* 3.4* 3.5  ?CL 96* 98  --  99 98 98  ?CO2 31 30  --  27 29 30   ?GLUCOSE 158* 74  --  72 138* 115*  ?BUN 30* 20  --  16 19 22*  ?CREATININE 4.13* 2.92*  --  2.47* 2.49* 2.77*  ?CALCIUM 8.3* 8.3*  --  8.0* 8.0* 8.6*  ?MG  --   --  1.8  --  1.9  --   ?PHOS  --  2.9  --  2.9 3.3  --   ? ? ? ?Liver Function Tests: ?Recent Labs  ?Lab 01/29/2022 ?1333 01/27/22 ?0940  ?AST 16  --   ?ALT 8  --   ?ALKPHOS 87  --   ?BILITOT 0.7  --   ?PROT 7.8  --   ?ALBUMIN 2.1* 1.7*  ? ? ?No results for input(s): LIPASE, AMYLASE in the last 168 hours. ?No results for input(s): AMMONIA in the last 168 hours. ? ?CBC: ?Recent Labs  ?Lab 01/28/2022 ?1333 01/26/22 ?0434 01/27/22 ?5993 01/28/22 ?2154 01/29/22 ?5701  ?WBC 12.6* 10.0 7.8 9.4 7.9  ?NEUTROABS 10.0*  --   --   --   --   ?HGB 10.6* 9.5* 8.7* 8.6* 8.7*  ?HCT 37.9* 33.0* 31.0* 30.1* 30.4*  ?MCV 90.2 88.5 88.6 87.0 88.1  ?PLT 202 164 113* 139* 155  ? ? ? ?Cardiac Enzymes: ?No results for input(s): CKTOTAL, CKMB, CKMBINDEX, TROPONINI in the last 168 hours. ? ?BNP: ?Invalid input(s): POCBNP ? ?CBG: ?Recent  Labs  ?Lab 01/28/22 ?1615 01/28/22 ?1947 01/29/22 ?0021 01/29/22 ?0539 01/29/22 ?1341  ?GLUCAP 115* 143* 136* 152* 106*  ? ? ? ?Microbiology: ?Results for orders placed or performed during the hospital encounter of 02/10/2022  ?Resp Panel by RT-PCR (Flu A&B, Covid) Nasopharyngeal Swab     Status: None  ? Collection Time: 01/17/2022  1:35 PM  ? Specimen: Nasopharyngeal Swab; Nasopharyngeal(NP) swabs in vial transport medium  ?Result Value Ref Range Status  ? SARS Coronavirus 2 by RT PCR NEGATIVE NEGATIVE Final  ?  Comment: (NOTE) ?SARS-CoV-2 target nucleic acids are NOT DETECTED. ? ?The SARS-CoV-2 RNA is generally detectable in upper respiratory ?specimens during the acute phase of infection. The lowest ?concentration of SARS-CoV-2 viral copies this assay can detect is ?138 copies/mL. A  negative result does not preclude SARS-Cov-2 ?infection and should not be used as the sole basis for treatment or ?other patient management decisions. A negative result may occur with  ?improper specimen collection/handling, submission of specimen other ?than nasopharyngeal swab, presence of viral mutation(s) within the ?areas targeted by this assay, and inadequate number of viral ?copies(<138 copies/mL). A negative result must be combined with ?clinical observations, patient history, and epidemiological ?information. The expected result is Negative. ? ?Fact Sheet for Patients:  ?EntrepreneurPulse.com.au ? ?Fact Sheet for Healthcare Providers:  ?IncredibleEmployment.be ? ?This test is no t yet approved or cleared by the [place] FDA and  ?has been authorized for detection and/or diagnosis of SARS-CoV-2 by ?FDA under an Emergency Use Authorization (EUA). This EUA will remain  ?in effect (meaning this test can be used) for the duration of the ?COVID-19 declaration under Section 564(b)(1) of the Act, 21 ?U.S.C.section 360bbb-3(b)(1), unless the authorization is terminated  ?or revoked sooner.  ? ? ?  ? Influenza A by PCR NEGATIVE NEGATIVE Final  ? Influenza B by PCR NEGATIVE NEGATIVE Final  ?  Comment: (NOTE) ?The Xpert Xpress SARS-CoV-2/FLU/RSV plus assay is intended as an aid ?in the diagnosis of influenza from Nasopharyngeal swab specimens and ?should not be used as a sole basis for treatment. Nasal washings and ?aspirates are unacceptable for Xpert Xpress SARS-CoV-2/FLU/RSV ?testing. ? ?Fact Sheet for Patients: ?EntrepreneurPulse.com.au ? ?Fact Sheet for Healthcare Providers: ?IncredibleEmployment.be ? ?This test is not yet approved or cleared by the [place] FDA and ?has been authorized for detection and/or diagnosis of SARS-CoV-2 by ?FDA under an Emergency Use Authorization (EUA). This EUA will remain ?in effect (meaning this test can  be used) for the duration of the ?COVID-19 declaration under Section 564(b)(1) of the Act, 21 U.S.C. ?section 360bbb-3(b)(1), unless the authorization is terminated or ?revoked. ? ?Performed at [facility], [place], ?[place] [address] ?  ?Culture, blood (routine x 2) Call MD if unable to obtain prior to antibiotics being given     Status: None (Preliminary result)  ? Collection Time: 01/21/2022  4:29 PM  ? Specimen: BLOOD  ?Result Value Ref Range Status  ? Specimen Description BLOOD BLOOD RIGHT HAND  Final  ? Special Requests   Final  ?  BOTTLES DRAWN AEROBIC AND ANAEROBIC Blood Culture adequate volume  ? Culture   Final  ?  NO GROWTH 4 DAYS ?Performed at [facility], [address], [place], [place] [address] ?  ? Report Status PENDING  Incomplete  ?Culture, blood (routine x 2) Call MD if unable to obtain prior to antibiotics being given     Status: None (Preliminary result)  ? Collection Time: 01/27/2022  5:54  PM  ? Specimen: BLOOD  ?Result Value Ref Range Status  ? Specimen Description BLOOD BLOOD RIGHT HAND  Final  ? Special Requests   Final  ?  BOTTLES DRAWN AEROBIC AND ANAEROBIC Blood Culture adequate volume  ? Culture   Final  ?  NO GROWTH 4 DAYS ?Performed at [facility], [address], [place], [place] [address] ?  ? Report Status PENDING  Incomplete  ?MRSA Next Gen by PCR, Nasal     Status: None  ? Collection Time: 02/04/2022  6:03 PM  ? Specimen: Nasal Mucosa; Nasal Swab  ?Result Value Ref Range Status  ? MRSA by PCR Next Gen NOT DETECTED NOT DETECTED Final  ?  Comment: (NOTE) ?The GeneXpert MRSA Assay (FDA approved for NASAL specimens only), ?is one component of a comprehensive MRSA colonization surveillance ?program. It is not intended to diagnose MRSA infection nor to guide ?or monitor treatment for MRSA infections. ?Test performance is not FDA approved in patients less than 2 years ?old. ?Performed at [facility], [place], ?[place] [address] ?  ?Culture, Respiratory w Gram Stain     Status: None  ? Collection Time: 01/26/22 12:34 PM  ? Specimen: SPU  ?Result Value Ref Range Status  ? Specimen Description   Final  ?  SPUTUM ?P

## 2022-01-29 NOTE — Plan of Care (Signed)
?  Problem: Clinical Measurements: ?Goal: Respiratory complications will improve ?Outcome: Progressing ?  ?Problem: Clinical Measurements: ?Goal: Cardiovascular complication will be avoided ?Outcome: Progressing ?  ?Problem: Coping: ?Goal: Level of anxiety will decrease ?Outcome: Progressing ?  ?Problem: Pain Managment: ?Goal: General experience of comfort will improve ?Outcome: Progressing ?  ?Problem: Safety: ?Goal: Ability to remain free from injury will improve ?Outcome: Progressing ?  ?

## 2022-01-29 NOTE — Progress Notes (Signed)
ID ?Pt out of ICU ?Says he has no appetite ?No chest pain ?No fever ? ?O/e awake, weak, emaciated chronically ill, oriented to place and person ?BP (!) 160/75 (BP Location: Right Arm)   Pulse (!) 55   Temp 98.5 ?F (36.9 ?C)   Resp 18   Ht 5' 11"  (1.803 m)   Wt 54.8 kg   SpO2 90%   BMI 16.85 kg/m?   ?Chest b/l air entry ?Few rhonchi and crepts rt > left ?Hss1s2 ?Abd soft ?Left AV fistula ?CNS- generalized weakness ?Skin gluteal region indurated skin with sup fistulas ? ?Labs ? ?  Latest Ref Rng & Units 01/29/2022  ?  6:59 AM 01/28/2022  ?  9:54 PM 01/27/2022  ?  9:40 AM  ?CBC  ?WBC 4.0 - 10.5 K/uL 7.9   9.4   7.8    ?Hemoglobin 13.0 - 17.0 g/dL 8.7   8.6   8.7    ?Hematocrit 39.0 - 52.0 % 30.4   30.1   31.0    ?Platelets 150 - 400 K/uL 155   139   113    ?  ? ?  Latest Ref Rng & Units 01/29/2022  ?  6:59 AM 01/28/2022  ?  9:54 PM 01/27/2022  ?  9:40 AM  ?CMP  ?Glucose 70 - 99 mg/dL 115   138   72    ?BUN 6 - 20 mg/dL 22   19   16     ?Creatinine 0.61 - 1.24 mg/dL 2.77   2.49   2.47    ?Sodium 135 - 145 mmol/L 137   136   135    ?Potassium 3.5 - 5.1 mmol/L 3.5   3.4   3.2    ?Chloride 98 - 111 mmol/L 98   98   99    ?CO2 22 - 32 mmol/L 30   29   27     ?Calcium 8.9 - 10.3 mg/dL 8.6   8.0   8.0    ?  ?Impression/recommendation ?Acute hypoxia secondary to right lung collapse from mucous plug.  Status post bronchoscopy and aspiration of mucous ?CHF also in play ?Currently on Unasyn ?Culture negative ? ?Acute loss of consciousness secondary to hypoxia has resolved ?  ?CAD, cardiomyopathy ?End-stage renal disease on dialysis ? ?Severe hidradenitis suppurativa of the gluteal region.  On Unasyn , e.faecium R to ampicillin- so will ad dlinezolid for 7 days ?Takes Cosentyx.  Followed at [facility] by Dr. Otho Ket ?  ?Anemia of chronic disease and end-stage renal disease ?Hypoalbuminemia ? ? ?History of treated septic arthritis of the knee ?History of treated finger osteo ? ?___________________________________________________ ?Discussed  with care team ? ? ? ?

## 2022-01-29 NOTE — Progress Notes (Signed)
Hemodialysis Post Treatment Note: ? ?Tx date: 01/29/2022 ?Tx time:3 hours and 30 minutes ?Access: Left AVF ?UF Removed: 1L ? ?Note: HD completed, tolerated well, No complications, Pt asymptomatic ? ? ? ? ? ? ? ? ?  ?

## 2022-01-29 NOTE — Plan of Care (Signed)
PMT note: ? ?Patient is currently off the unit and in dialysis. Will follow up Monday. If he discharges prior to Monday, I recommend outpatient palliative to follow. ?

## 2022-01-29 NOTE — Progress Notes (Signed)
? ?                                                                                                                                                     ?                                                   ?Daily Progress Note  ? ?Patient Name: [name]       Date: 01/29/2022 ?DOB: [birth year]  Age: 61 y.o. MRN#: [id number] ?Attending Physician: Jennye Boroughs, MD ?Primary Care Physician: [name], MD ?Admit Date: 02/12/2022 ? ?Reason for Consultation/Follow-up: Establishing goals of care ? ?Subjective: ?Notes reviewed. Patient is resting in bed with no family at bedside. He tells me he has talked to his wife. He states he does want to do things on his terms and has let her know his wishes. He states "I don't want to be rushed out of this world", but is clear he is tired. He states he does not want a feeding tube. He wants to eat as he wishes. He does not like the thickened liquids. He understands his BMI, and his prognosis just based on this. He states he is really ready to leave this earth but wants his son to be okay with his wishes. He is considering his wishes on stopping dialysis because his family is not in full support of comfort care. Inquired his thoughts on CPR and ventilator support. He states he would not want CPR, resuscitation, or ventilator support, but wants his family to be okay with this.  He asked me to call his wife.  ? ?With much conversation, wife states their children are okay with his wishes. She states really she is the one who is having difficulty. She states she understands his wishes, but she is having difficulty with letting him go. She states her daughter has advised her that it is his body, and we should do what he wants, not what we want. She states she does believe he is suffering. She states she will talk with him further.   ? ? ?Length of Stay: 4 ? ?Current Medications: ?Scheduled Meds:  ? amLODipine  10 mg Oral QPM  ? vitamin C  500 mg Oral BID  ? atorvastatin  80 mg  Oral QHS  ? carvedilol  25 mg Oral BID WC  ? chlorhexidine  15 mL Mouth Rinse BID  ? Chlorhexidine Gluconate Cloth  6 each Topical Q0600  ? Chlorhexidine Gluconate Cloth  6 each Topical Q0600  ? cloNIDine  0.1 mg Oral BID  ? docusate sodium  100 mg Oral BID  ? feeding supplement (NEPRO CARB STEADY)  237  mL Oral TID BM  ? gabapentin  100 mg Oral QHS  ? heparin  5,000 Units Subcutaneous Q8H  ? hydrALAZINE  100 mg Oral Q8H  ? HYDROcodone-acetaminophen  1 tablet Oral Daily  ? insulin aspart  0-6 Units Subcutaneous TID WC  ? ipratropium-albuterol  3 mL Nebulization TID  ? isosorbide dinitrate  15 mg Oral BID  ? levETIRAcetam  750 mg Oral Daily  ? linezolid  600 mg Oral Q12H  ? lisinopril  40 mg Oral Daily  ? mouth rinse  15 mL Mouth Rinse q12n4p  ? mirtazapine  7.5 mg Oral QHS  ? multivitamin with minerals  1 tablet Oral Daily  ? pantoprazole (PROTONIX) IV  40 mg Intravenous Q12H  ? polyethylene glycol  17 g Oral Daily  ? sevelamer carbonate  800 mg Oral 2 times per day on Mon Wed Fri  ? sevelamer carbonate  800 mg Oral 3 times per day on Sun Tue Thu Sat  ? ? ?Continuous Infusions: ? ampicillin-sulbactam (UNASYN) IV 3 g (01/28/22 2000)  ? ? ?PRN Meds: ?acetaminophen (TYLENOL) oral liquid 160 mg/5 mL, albuterol, dextromethorphan-guaiFENesin, fentaNYL (SUBLIMAZE) injection, hydrALAZINE, LORazepam, ondansetron (ZOFRAN) IV ? ?Physical Exam ?Pulmonary:  ?   Effort: Pulmonary effort is normal.  ?Neurological:  ?   Mental Status: He is alert.  ?         ? ?Vital Signs: BP (!) 144/69 (BP Location: Right Arm)   Pulse 77   Temp 98 ?F (36.7 ?C)   Resp 18   Ht 5' 11"  (1.803 m)   Wt 54.8 kg   SpO2 100%   BMI 16.85 kg/m?  ?SpO2: SpO2: 100 % ?O2 Device: O2 Device: Nasal Cannula ?O2 Flow Rate: O2 Flow Rate (L/min): 2 L/min ? ?Intake/output summary:  ?Intake/Output Summary (Last 24 hours) at 01/29/2022 1433 ?Last data filed at 01/29/2022 1234 ?Gross per 24 hour  ?Intake --  ?Output 1000 ml  ?Net -1000 ml  ? ?LBM: Last BM Date :  01/18/2022 ?Baseline Weight: Weight: 75.3 kg ?Most recent weight: Weight: 54.8 kg ? ?     ? ? ?Patient Active Problem List  ? Diagnosis Date Noted  ? Depression 01/28/2022  ? Unresponsiveness 02/07/2022  ? Mucus plugging of bronchi 02/01/2022  ? HLD (hyperlipidemia) 02/05/2022  ? Elevated troponin 01/30/2022  ? Multifocal pneumonia   ? Type 2 diabetes mellitus with diabetic neuropathy, without long-term current use of insulin ([place]) 07/27/2021  ? Anemia in ESRD (end-stage renal disease) ([place]) 07/27/2021  ? Hypoglycemia 07/27/2021  ? Hypothermia 07/27/2021  ? COVID-19 virus infection 07/27/2021  ? Aspiration pneumonia ([place]) 07/27/2021  ? Unstageable pressure ulcer of sacral region ([place]) 05/22/2021  ? Acute decompensated heart failure ([place]) 05/19/2021  ? Acute respiratory failure with hypoxia ([place]) 05/18/2021  ? Protein-calorie malnutrition, severe 05/14/2021  ? Acute pulmonary edema (H[facility])   ? Shortness of breath   ? Fluid overload 05/12/2021  ? Cellulitis and abscess of buttock   ? History of GI bleed 02/06/2021  ? Long term current use of immunosuppressive drug 02/06/2021  ? Disorder of skin due to Crohn's disease ([place]) 02/06/2021  ? Complication of vascular access for dialysis 01/22/2021  ? Polyp of transverse colon   ? Acute metabolic encephalopathy 32/44/0102  ? Acute on chronic anemia 11/18/2020  ? Chronic systolic CHF (congestive heart failure) ([place]) 11/18/2020  ? Cerebrovascular disease 09/12/2020  ? Transient alteration of awareness 09/12/2020  ? Hyperkalemia 09/12/2020  ? Mild cognitive impairment 09/30/2019  ? Peripheral  vascular disease ([place]) 06/28/2018  ? Sepsis ([place]) 01/12/2018  ? Pressure injury of skin 12/04/2017  ? S/P bilateral BKA (below knee amputation) ([place]) 10/20/2017  ? Atherosclerosis of native arteries of extremity with rest pain ([place]) 08/05/2017  ? History of CVA (cerebrovascular accident) 04/15/2017  ? Seizure disorder ([place]) 04/15/2017  ? ESRD (end stage renal disease) ([place]) 04/12/2016  ?  Aphthae 02/20/2016  ? Hidradenitis suppurativa 02/20/2016  ? Leg pain 02/20/2016  ? Neuropathy 02/20/2016  ? Narrowing of intervertebral disc space 08/29/2015  ? Vascular disorder of lower extremity 08/29/2015  ? Failure of erection 08/29/2015  ? Hyperlipidemia 08/29/2015  ? Essential hypertension 08/29/2015  ? Anemia due to chronic kidney disease 05/26/2015  ? Venous insufficiency of leg 09/04/2014  ? History of deep vein thrombosis (DVT) of lower extremity 08/16/2014  ? Prostatic intraepithelial neoplasia 11/02/2013  ? Elevated prostate specific antigen (PSA) 09/11/2013  ? Benign prostatic hyperplasia with urinary obstruction 08/13/2013  ? Spermatocele 08/13/2013  ? Avitaminosis D 01/25/2013  ? Type 2 diabetes mellitus with hypoglycemia without coma ([place]) 03/28/2012  ? Crohn disease ([place]) 08/03/2011  ? ? ?Palliative Care Assessment & Plan  ? ? ?Recommendations/Plan: ?Patient wants his family to be in agreement with his wishes. They will talk further over the weekend.  ? ?Code Status: ? ?  ?Code Status Orders  ?(From admission, onward)  ?  ? ? ?  ? ?  Start     Ordered  ? 02/01/2022 1606  Full code  Continuous       ? 02/14/2022 1606  ? ?  ?  ? ?  ? ?Code Status History   ? ? Date Active Date Inactive Code Status Order ID Comments User Context  ? 11/06/2021 2011 11/12/2021 1916 Full Code [id number]  Collier Bullock, MD Inpatient  ? 07/27/2021 1321 08/02/2021 2249 Full Code [id number]  Ivor Costa, MD ED  ? 06/09/2021 1039 06/13/2021 2348 Full Code [id number]  Collier Bullock, MD ED  ? 05/18/2021 0539 05/26/2021 2146 Full Code [id number]  Rise Patience, MD ED  ? 05/12/2021 2306 05/15/2021 2240 Full Code [id number]  Clance Boll, MD ED  ? 02/06/2021 2254 02/10/2021 2225 Full Code [id number]  Athena Masse, MD ED  ? 11/18/2020 1508 11/25/2020 1534 Full Code [id number]  Ivor Costa, MD ED  ? 09/12/2020 1917 09/14/2020 2156 Full Code [id number]  Para Skeans, MD ED  ? 01/05/2019 2016 01/08/2019 1759 Full Code [id number]  Fritzi Mandes,  MD Inpatient  ? 12/04/2017 0511 12/12/2017 2355 Full Code [id number]  SalaryAvel Peace, MD Inpatient  ? 08/22/2017 1244 08/22/2017 1800 Full Code [id number]  Algernon Huxley, MD Inpatient  ? 04/21/2017 1819 04/25/2017 1915 Full Co

## 2022-01-29 NOTE — Progress Notes (Signed)
SLP Cancellation Note ? ?Patient Details ?Name: [name] ?MRN: [id number] ?DOB: Sep 12, 1961 ? ? ?Cancelled treatment:       Reason Eval/Treat Not Completed: Patient at procedure or test/unavailable (chart reviewed; consulted NSG re: pt's status; attempted to see pt post HD) ?Upon coming to room x2, pt was first being seen by Palliative Care for consult, GOC. Upon 2nd attempt, pt was having a breathing tx. Per consultation w/ NSG, pt was exhibiting mild oral confusion both this AM prior to HD, then after. NSG noted pt not fully aware to oral residue b/t bites of pills crushed in applesauce.  ?Will hold on dysphagia tx/trials to upgrade diet today allowing pt to rest post his HD(which can be fatiguing). ST services will f/u tomorrow for ongoing assessment. Recommend ongoing aspiration precautions and Nectar liquids at meals. NSG agreed.  ? ? ? ? ?Orinda Kenner, MS, CCC-SLP ?Speech Language Pathologist ?Re[facility]; [place] ?[phone] (ascom) ?Quantel Mcinturff ?01/29/2022, 2:52 PM ?

## 2022-01-29 NOTE — Progress Notes (Addendum)
Progress Note    [name]  NWG:[id number] DOB: Mar 30, 1961  DOA: Jan 29, 2022 PCP: Malva Limes, MD      Brief Narrative:    Medical records reviewed and are as summarized below:  [name] is a 61 y.o. male with medical history significant for hypertension, type 2 diabetes mellitus, ESRD on hemodialysis, seizure disorder, history of stroke, chronic systolic and diastolic CHF, chronic anemia, hyperlipidemia, hidradenitis suppurativa.  He was brought to the hospital (from outpatient dialysis unit) because of unresponsiveness during hemodialysis.  He was admitted to the hospital for acute hypoxic respiratory failure from acute on chronic systolic and diastolic CHF, mucous plugging and probable pneumonia.  Unresponsiveness was attributed to acute metabolic encephalopathy.  He required intubation and mechanical ventilation.  He was treated with empiric IV antibiotics.  He underwent bronchoscopy with aspiration of mucous plug.  He was seen by the nephrologist for hemodialysis for CHF exacerbation and ESRD.  He also has hidradenitis suppurativa which was treated with IV Unasyn.  He was seen by the psychiatrist for depression and he has been started on mirtazapine.  He was transferred from the ICU to the hospitalist service on 01/29/2022.   Assessment/Plan:   Principal Problem:   Unresponsiveness Active Problems:   Acute respiratory failure with hypoxia (HCC)   Mucus plugging of bronchi   Essential hypertension   Crohn disease (HCC)   Hidradenitis suppurativa   ESRD (end stage renal disease) (HCC)   History of CVA (cerebrovascular accident)   Seizure disorder (HCC)   Chronic systolic CHF (congestive heart failure) (HCC)   Anemia in ESRD (end-stage renal disease) (HCC)   HLD (hyperlipidemia)   Elevated troponin   Depression   Nutrition Problem: Severe Malnutrition Etiology: chronic illness (ESRD on HD, DM, Crohn's disease, CHF, CVA)  Signs/Symptoms: severe fat  depletion, severe muscle depletion   Body mass index is 16.85 kg/m.  (Underweight)   Acute hypoxic respiratory failure: Improving.  S/p extubation on 01/27/2022.  Continue 2 L/min oxygen via nasal cannula and taper off as able.  Acute on chronic systolic and diastolic CHF, mildly elevated troponin: Volume management with hemodialysis.  Elevated troponin is thought to be due to demand ischemia. 2D echo in July 2022 showed EF estimated at 30 to 35%, grade 2 diastolic dysfunction, mild to moderate MR, moderate TR., mildly elevated troponin:  Probable pneumonia, s/p bronchoscopy with aspiration of mucous plug, hidradenitis suppurativa of the gluteal and perineal regions: Continue IV Unasyn.  Follow-up with ID for further recommendations  Acute metabolic encephalopathy: Improved  ESRD on HD: Follow-up with nephrologist for hemodialysis  Hypokalemia: Improved  Type II DM with hypoglycemia: Encourage adequate oral intake.  Monitor glucose levels closely.  Depression: He has been started on mirtazapine.  Appreciate input from psychiatrist.  Generalized weakness: Consult PT and OT  Other comorbidities include seizure disorder, history of stroke, anemia of chronic disease, type 2 diabetes mellitus, hypertension   Diet Order             DIET DYS 3 Room service appropriate? Yes; Fluid consistency: Nectar Thick  Diet effective now                      Consultants: Intensivist Nephrologist Infectious disease  Procedures: Bronchoscopy on 01/26/2022 Intubation on 01/26/2022    Medications:    amLODipine  10 mg Oral QPM   vitamin C  500 mg Oral BID   atorvastatin  80 mg Oral QHS  carvedilol  25 mg Oral BID WC   chlorhexidine  15 mL Mouth Rinse BID   Chlorhexidine Gluconate Cloth  6 each Topical Q0600   Chlorhexidine Gluconate Cloth  6 each Topical Q0600   cloNIDine  0.1 mg Oral BID   docusate sodium  100 mg Oral BID   [START ON 02/01/2022] epoetin (EPOGEN/PROCRIT)  injection  10,000 Units Intravenous Q M,W,F-HD   feeding supplement (NEPRO CARB STEADY)  237 mL Oral TID BM   gabapentin  100 mg Oral QHS   heparin  5,000 Units Subcutaneous Q8H   hydrALAZINE  100 mg Oral Q8H   HYDROcodone-acetaminophen  1 tablet Oral Daily   insulin aspart  0-6 Units Subcutaneous TID WC   ipratropium-albuterol  3 mL Nebulization TID   isosorbide dinitrate  15 mg Oral BID   levETIRAcetam  750 mg Oral Daily   linezolid  600 mg Oral Q12H   lisinopril  40 mg Oral Daily   mouth rinse  15 mL Mouth Rinse q12n4p   mirtazapine  7.5 mg Oral QHS   multivitamin with minerals  1 tablet Oral Daily   pantoprazole (PROTONIX) IV  40 mg Intravenous Q12H   polyethylene glycol  17 g Oral Daily   sevelamer carbonate  800 mg Oral 2 times per day on Mon Wed Fri   sevelamer carbonate  800 mg Oral 3 times per day on Sun Tue Thu Sat   Continuous Infusions:  ampicillin-sulbactam (UNASYN) IV 3 g (01/28/22 2000)     Anti-infectives (From admission, onward)    Start     Dose/Rate Route Frequency Ordered Stop   01/29/22 1600  linezolid (ZYVOX) tablet 600 mg        600 mg Oral Every 12 hours 01/29/22 1428     01/26/22 1800  Ampicillin-Sulbactam (UNASYN) 3 g in sodium chloride 0.9 % 100 mL IVPB        3 g 200 mL/hr over 30 Minutes Intravenous Every 24 hours 01/26/22 0850     01/25/22 1600  cefTRIAXone (ROCEPHIN) 1 g in sodium chloride 0.9 % 100 mL IVPB  Status:  Discontinued        1 g 200 mL/hr over 30 Minutes Intravenous Every 24 hours 01/25/22 1549 01/26/22 0829   01/25/22 1600  azithromycin (ZITHROMAX) 500 mg in sodium chloride 0.9 % 250 mL IVPB  Status:  Discontinued        500 mg 250 mL/hr over 60 Minutes Intravenous Every 24 hours 01/25/22 1549 01/26/22 0829   01/25/22 1545  cefTRIAXone (ROCEPHIN) 2 g in sodium chloride 0.9 % 100 mL IVPB  Status:  Discontinued        2 g 200 mL/hr over 30 Minutes Intravenous  Once 01/25/22 1530 01/25/22 1548   01/25/22 1545  azithromycin (ZITHROMAX)  500 mg in sodium chloride 0.9 % 250 mL IVPB  Status:  Discontinued        500 mg 250 mL/hr over 60 Minutes Intravenous  Once 01/25/22 1530 01/25/22 1548   01/25/22 1530  levofloxacin (LEVAQUIN) IVPB 750 mg  Status:  Discontinued        750 mg 100 mL/hr over 90 Minutes Intravenous  Once 01/25/22 1519 01/25/22 1530              Family Communication/Anticipated D/C date and plan/Code Status   DVT prophylaxis: heparin injection 5,000 Units Start: 01/25/22 1615     Code Status: Full Code  Family Communication: None Disposition Plan: Plan to discharge home versus SNF  when medically stable   Status is: Inpatient Remains inpatient appropriate because: On IV antibiotics       Subjective:   Interval events noted.  No shortness of breath, cough or chest pain.  Objective:    Vitals:   01/29/22 1230 01/29/22 1245 01/29/22 1326 01/29/22 1521  BP: 136/62 (!) 143/49 (!) 144/69 (!) 153/64  Pulse:   77 73  Resp: (!) 21 (!) 21 18 18   Temp:  98.3 F (36.8 C) 98 F (36.7 C) 99 F (37.2 C)  TempSrc:  Oral    SpO2:   100% 93%  Weight:  54.8 kg    Height:       No data found.   Intake/Output Summary (Last 24 hours) at 01/29/2022 1539 Last data filed at 01/29/2022 1234 Gross per 24 hour  Intake --  Output 1000 ml  Net -1000 ml   Filed Weights   01/29/22 0352 01/29/22 0849 01/29/22 1245  Weight: 59.7 kg 56.1 kg 54.8 kg    Exam:  GEN: NAD SKIN: Warm and dry.  Multiple wounds on lower back, buttocks and perineal region EYES: No pallor or icterus ENT: MMM CV: RRR PULM: CTA B ABD: soft, ND, NT, +BS CNS: AAO x 3, non focal.  Speech is soft EXT: Bilateral BKA.  No edema or tenderness     Data Reviewed:   I have personally reviewed following labs and imaging studies:  Labs: Labs show the following:   Basic Metabolic Panel: Recent Labs  Lab 2022-02-21 1333 01/26/22 0434 01/26/22 0611 01/27/22 0940 01/28/22 2154 01/29/22 0659  NA 137 138  --  135 136  137  K 3.4* 3.1*  --  3.2* 3.4* 3.5  CL 96* 98  --  99 98 98  CO2 31 30  --  27 29 30   GLUCOSE 158* 74  --  72 138* 115*  BUN 30* 20  --  16 19 22*  CREATININE 4.13* 2.92*  --  2.47* 2.49* 2.77*  CALCIUM 8.3* 8.3*  --  8.0* 8.0* 8.6*  MG  --   --  1.8  --  1.9  --   PHOS  --  2.9  --  2.9 3.3  --    GFR Estimated Creatinine Clearance: 22.3 mL/min (A) (by C-G formula based on SCr of 2.77 mg/dL (H)). Liver Function Tests: Recent Labs  Lab 02/21/2022 1333 01/27/22 0940  AST 16  --   ALT 8  --   ALKPHOS 87  --   BILITOT 0.7  --   PROT 7.8  --   ALBUMIN 2.1* 1.7*   No results for input(s): LIPASE, AMYLASE in the last 168 hours. No results for input(s): AMMONIA in the last 168 hours. Coagulation profile No results for input(s): INR, PROTIME in the last 168 hours.  CBC: Recent Labs  Lab 02-21-2022 1333 01/26/22 0434 01/27/22 0940 01/28/22 2154 01/29/22 0659  WBC 12.6* 10.0 7.8 9.4 7.9  NEUTROABS 10.0*  --   --   --   --   HGB 10.6* 9.5* 8.7* 8.6* 8.7*  HCT 37.9* 33.0* 31.0* 30.1* 30.4*  MCV 90.2 88.5 88.6 87.0 88.1  PLT 202 164 113* 139* 155   Cardiac Enzymes: No results for input(s): CKTOTAL, CKMB, CKMBINDEX, TROPONINI in the last 168 hours. BNP (last 3 results) No results for input(s): PROBNP in the last 8760 hours. CBG: Recent Labs  Lab 01/28/22 1615 01/28/22 1947 01/29/22 0021 01/29/22 0417 01/29/22 1341  GLUCAP 115* 143* 136* 152*  106*   D-Dimer: No results for input(s): DDIMER in the last 72 hours. Hgb A1c: No results for input(s): HGBA1C in the last 72 hours. Lipid Profile: No results for input(s): CHOL, HDL, LDLCALC, TRIG, CHOLHDL, LDLDIRECT in the last 72 hours. Thyroid function studies: No results for input(s): TSH, T4TOTAL, T3FREE, THYROIDAB in the last 72 hours.  Invalid input(s): FREET3 Anemia work up: No results for input(s): VITAMINB12, FOLATE, FERRITIN, TIBC, IRON, RETICCTPCT in the last 72 hours. Sepsis Labs: Recent Labs  Lab  02/10/22 1333 2022-02-10 1628 02-10-2022 1942 01/26/22 0434 01/27/22 0236 01/27/22 0940 01/28/22 2154 01/29/22 0659  PROCALCITON 0.33  --   --  1.11 1.61  --   --   --   WBC 12.6*  --   --  10.0  --  7.8 9.4 7.9  LATICACIDVEN  --  0.9 1.2  --   --   --   --   --     Microbiology Recent Results (from the past 240 hour(s))  Resp Panel by RT-PCR (Flu A&B, Covid) Nasopharyngeal Swab     Status: None   Collection Time: 02-10-22  1:35 PM   Specimen: Nasopharyngeal Swab; Nasopharyngeal(NP) swabs in vial transport medium  Result Value Ref Range Status   SARS Coronavirus 2 by RT PCR NEGATIVE NEGATIVE Final    Comment: (NOTE) SARS-CoV-2 target nucleic acids are NOT DETECTED.  The SARS-CoV-2 RNA is generally detectable in upper respiratory specimens during the acute phase of infection. The lowest concentration of SARS-CoV-2 viral copies this assay can detect is 138 copies/mL. A negative result does not preclude SARS-Cov-2 infection and should not be used as the sole basis for treatment or other patient management decisions. A negative result may occur with  improper specimen collection/handling, submission of specimen other than nasopharyngeal swab, presence of viral mutation(s) within the areas targeted by this assay, and inadequate number of viral copies(<138 copies/mL). A negative result must be combined with clinical observations, patient history, and epidemiological information. The expected result is Negative.  Fact Sheet for Patients:  BloggerCourse.com  Fact Sheet for Healthcare Providers:  SeriousBroker.it  This test is no t yet approved or cleared by the [place] FDA and  has been authorized for detection and/or diagnosis of SARS-CoV-2 by FDA under an Emergency Use Authorization (EUA). This EUA will remain  in effect (meaning this test can be used) for the duration of the COVID-19 declaration under Section 564(b)(1) of  the Act, 21 U.S.C.section 360bbb-3(b)(1), unless the authorization is terminated  or revoked sooner.       Influenza A by PCR NEGATIVE NEGATIVE Final   Influenza B by PCR NEGATIVE NEGATIVE Final    Comment: (NOTE) The Xpert Xpress SARS-CoV-2/FLU/RSV plus assay is intended as an aid in the diagnosis of influenza from Nasopharyngeal swab specimens and should not be used as a sole basis for treatment. Nasal washings and aspirates are unacceptable for Xpert Xpress SARS-CoV-2/FLU/RSV testing.  Fact Sheet for Patients: BloggerCourse.com  Fact Sheet for Healthcare Providers: SeriousBroker.it  This test is not yet approved or cleared by the [place] FDA and has been authorized for detection and/or diagnosis of SARS-CoV-2 by FDA under an Emergency Use Authorization (EUA). This EUA will remain in effect (meaning this test can be used) for the duration of the COVID-19 declaration under Section 564(b)(1) of the Act, 21 U.S.C. section 360bbb-3(b)(1), unless the authorization is terminated or revoked.  Performed at [facility], [address] [place], [place] [address]   Culture,  blood (routine x 2) Call MD if unable to obtain prior to antibiotics being given     Status: None (Preliminary result)   Collection Time: 2022-02-13  4:29 PM   Specimen: BLOOD  Result Value Ref Range Status   Specimen Description BLOOD BLOOD RIGHT HAND  Final   Special Requests   Final    BOTTLES DRAWN AEROBIC AND ANAEROBIC Blood Culture adequate volume   Culture   Final    NO GROWTH 4 DAYS Performed at [facility], [address], [place], [place] [address]    Report Status PENDING  Incomplete  Culture, blood (routine x 2) Call MD if unable to obtain prior to antibiotics being given     Status: None (Preliminary result)   Collection Time: 13-Feb-2022  5:54 PM   Specimen: BLOOD  Result Value Ref Range Status   Specimen Description BLOOD  BLOOD RIGHT HAND  Final   Special Requests   Final    BOTTLES DRAWN AEROBIC AND ANAEROBIC Blood Culture adequate volume   Culture   Final    NO GROWTH 4 DAYS Performed at [facility], [address] [place], [place] [address]    Report Status PENDING  Incomplete  MRSA Next Gen by PCR, Nasal     Status: None   Collection Time: 2022-02-13  6:03 PM   Specimen: Nasal Mucosa; Nasal Swab  Result Value Ref Range Status   MRSA by PCR Next Gen NOT DETECTED NOT DETECTED Final    Comment: (NOTE) The GeneXpert MRSA Assay (FDA approved for NASAL specimens only), is one component of a comprehensive MRSA colonization surveillance program. It is not intended to diagnose MRSA infection nor to guide or monitor treatment for MRSA infections. Test performance is not FDA approved in patients less than 65 years old. Performed at [facility], [address] [place], [place] [address]   Culture, Respiratory w Gram Stain     Status: None   Collection Time: 01/26/22 12:34 PM   Specimen: SPU  Result Value Ref Range Status   Specimen Description   Final    SPUTUM Performed at [facility], [address], [place], [place] [address]    Special Requests   Final    NONE Performed at [facility], [address] [place], [place] [address]    Gram Stain   Final    NO SQUAMOUS EPITHELIAL CELLS SEEN MODERATE WBC SEEN RARE GRAM POSITIVE COCCI    Culture   Final    FEW Normal respiratory flora-no Staph aureus or Pseudomonas seen Performed at [facility], [address], [place], [place] [address]    Report Status 01/28/2022 FINAL  Final  Aerobic/Anaerobic Culture w Gram Stain (surgical/deep wound)     Status: None   Collection Time: 01/26/22  2:40 PM   Specimen: Groin; Abscess  Result Value Ref Range Status   Specimen Description   Final    GROIN LEFT Performed at [facility], [address], [place], [place] [address]    Special Requests    Final    Immunocompromised Performed at [facility], [address] [place], [place] [address]    Gram Stain   Final    NO SQUAMOUS EPITHELIAL CELLS SEEN FEW WBC SEEN FEW GRAM NEGATIVE RODS FEW GRAM POSITIVE COCCI    Culture   Final    FEW CORYNEBACTERIUM STRIATUM FEW ENTEROCOCCUS FAECIUM VANCOMYCIN RESISTANT ENTEROCOCCUS ISOLATED RARE BACTEROIDES OVATUS BETA LACTAMASE POSITIVE Performed at  [facility], [address], [place], [place] [address]    Report Status 01/29/2022 FINAL  Final   Organism ID, Bacteria ENTEROCOCCUS FAECIUM  Final      Susceptibility   Enterococcus faecium - MIC*    AMPICILLIN >=32 RESISTANT Resistant     VANCOMYCIN >=32 RESISTANT Resistant     GENTAMICIN SYNERGY SENSITIVE Sensitive     LINEZOLID 2 SENSITIVE Sensitive     * FEW ENTEROCOCCUS FAECIUM  Aerobic/Anaerobic Culture w Gram Stain (surgical/deep wound)     Status: None   Collection Time: 01/26/22  2:40 PM   Specimen: Buttocks; Abscess  Result Value Ref Range Status   Specimen Description   Final    BUTTOCKS Performed at [facility], [address], [place], [place] [address]    Special Requests   Final    Immunocompromised Performed at [facility], [address] [place], [place] [address]    Gram Stain   Final    NO SQUAMOUS EPITHELIAL CELLS SEEN FEW WBC SEEN NO ORGANISMS SEEN    Culture   Final    RARE CORYNEBACTERIUM STRIATUM RARE ENTEROCOCCUS FAECIUM VANCOMYCIN RESISTANT ENTEROCOCCUS ISOLATED RARE BACTEROIDES OVATUS BETA LACTAMASE POSITIVE Performed at [facility], [address], [place], [place] [address]    Report Status 01/29/2022 FINAL  Final   Organism ID, Bacteria ENTEROCOCCUS FAECIUM  Final      Susceptibility   Enterococcus faecium - MIC*    AMPICILLIN >=32 RESISTANT Resistant     VANCOMYCIN >=32 RESISTANT Resistant     GENTAMICIN SYNERGY SENSITIVE Sensitive     LINEZOLID 2 SENSITIVE Sensitive     * RARE ENTEROCOCCUS  FAECIUM    Procedures and diagnostic studies:  No results found.             LOS: 4 days   Mirjana Tarleton  [facility]   Pager on [email or web address]. If 7PM-7AM, please contact night-coverage at [email or web address]     01/29/2022, 3:39 PM

## 2022-01-29 NOTE — Progress Notes (Signed)
Patient alert and oriented to self and place. Patient assisted with meals- patient not fully clearing applesauce or food after each bite. Patient refusing meals and difficult to get pills in due to refusal of PO intake. I informed Speech therapy about swallowing difficulties. I also informed Dr. Mal Misty about poor PO intake this shift. Patient denies pain, but does state "I am tired".  ?

## 2022-01-29 NOTE — Progress Notes (Signed)
PHARMACY CONSULT NOTE - FOLLOW UP ? ?Pharmacy Consult for Electrolyte Monitoring and Replacement  ? ?Recent Labs: ?Potassium (mmol/L)  ?Date Value  ?01/29/2022 3.5  ?07/24/2014 4.3  ? ?Magnesium (mg/dL)  ?Date Value  ?01/28/2022 1.9  ? ?Calcium (mg/dL)  ?Date Value  ?01/29/2022 8.6 (L)  ? ?Calcium, Total (mg/dL)  ?Date Value  ?07/24/2014 9.3  ? ?Albumin (g/dL)  ?Date Value  ?01/27/2022 1.7 (L)  ?05/11/2021 3.3 (L)  ? ?Phosphorus (mg/dL)  ?Date Value  ?01/28/2022 3.3  ? ?Sodium (mmol/L)  ?Date Value  ?01/29/2022 137  ?05/11/2021 140  ?07/24/2014 134 (L)  ? ? ?Assessment: ?[name] is a 61 y.o. male admitted on 01/31/2022 with pneumonia. PMH includes ESRD on Hemodialysis MWF, HFrEF (LVEF 30-35%), Hypertension, Hyperlipidemia, Peripheral Vascular Disease s/p bilateral BKA's, T2DM, Stroke, ICH, seizures, Crohn's Disease, BPH, Hidradenitis Suppurativa on Cosentyx. Pharmacy has been consulted for electrolytes. ?  ?Presented with becoming unresponsive while at dialysis with oxygen desaturation after 15 minutes of HD session. Previously noncompliant with HD sessions. Now admitted with volume overload and concern for possible pneumonia. Extubated 4/12. ? ?Goal of Therapy:  ?Electrolytes within normal limits ? ?Plan:  ?Patient transferred from CCU. Pharmacy will sign off. Re-consult if needed.  ? ? ?Oswald Hillock, PharmD ?01/29/2022 ?7:40 AM ?

## 2022-01-30 DIAGNOSIS — J9601 Acute respiratory failure with hypoxia: Secondary | ICD-10-CM | POA: Diagnosis not present

## 2022-01-30 DIAGNOSIS — N186 End stage renal disease: Secondary | ICD-10-CM | POA: Diagnosis not present

## 2022-01-30 DIAGNOSIS — R4189 Other symptoms and signs involving cognitive functions and awareness: Secondary | ICD-10-CM | POA: Diagnosis not present

## 2022-01-30 DIAGNOSIS — T17500A Unspecified foreign body in bronchus causing asphyxiation, initial encounter: Secondary | ICD-10-CM | POA: Diagnosis not present

## 2022-01-30 LAB — BASIC METABOLIC PANEL
Anion gap: 8 (ref 5–15)
BUN: 16 mg/dL (ref 6–20)
CO2: 30 mmol/L (ref 22–32)
Calcium: 8.4 mg/dL — ABNORMAL LOW (ref 8.9–10.3)
Chloride: 99 mmol/L (ref 98–111)
Creatinine, Ser: 1.97 mg/dL — ABNORMAL HIGH (ref 0.61–1.24)
GFR, Estimated: 38 mL/min — ABNORMAL LOW (ref >60)
Glucose, Bld: 78 mg/dL (ref 70–99)
Potassium: 3.1 mmol/L — ABNORMAL LOW (ref 3.5–5.1)
Sodium: 137 mmol/L (ref 135–145)

## 2022-01-30 LAB — CULTURE, BLOOD (ROUTINE X 2)
Culture: NO GROWTH
Culture: NO GROWTH
Special Requests: ADEQUATE
Special Requests: ADEQUATE

## 2022-01-30 LAB — CBC
HCT: 27.6 % — ABNORMAL LOW (ref 39.0–52.0)
Hemoglobin: 7.9 g/dL — ABNORMAL LOW (ref 13.0–17.0)
MCH: 24.9 pg — ABNORMAL LOW (ref 26.0–34.0)
MCHC: 28.6 g/dL — ABNORMAL LOW (ref 30.0–36.0)
MCV: 87.1 fL (ref 80.0–100.0)
Platelets: 121 10*3/uL — ABNORMAL LOW (ref 150–400)
RBC: 3.17 MIL/uL — ABNORMAL LOW (ref 4.22–5.81)
RDW: 20.8 % — ABNORMAL HIGH (ref 11.5–15.5)
WBC: 5.1 10*3/uL (ref 4.0–10.5)
nRBC: 0 % (ref 0.0–0.2)

## 2022-01-30 LAB — GLUCOSE, CAPILLARY
Glucose-Capillary: 101 mg/dL — ABNORMAL HIGH (ref 70–99)
Glucose-Capillary: 113 mg/dL — ABNORMAL HIGH (ref 70–99)
Glucose-Capillary: 123 mg/dL — ABNORMAL HIGH (ref 70–99)
Glucose-Capillary: 72 mg/dL (ref 70–99)

## 2022-01-30 LAB — MAGNESIUM: Magnesium: 1.7 mg/dL (ref 1.7–2.4)

## 2022-01-30 LAB — PHOSPHORUS: Phosphorus: 2.6 mg/dL (ref 2.5–4.6)

## 2022-01-30 MED ORDER — SODIUM CHLORIDE 0.9 % IV SOLN
3.0000 g | INTRAVENOUS | Status: DC
  Administered 2022-01-30: 3 g via INTRAVENOUS
  Filled 2022-01-30: qty 3

## 2022-01-30 MED ORDER — LINEZOLID 600 MG/300ML IV SOLN
600.0000 mg | Freq: Two times a day (BID) | INTRAVENOUS | Status: AC
  Administered 2022-01-30: 600 mg via INTRAVENOUS
  Filled 2022-01-30: qty 300

## 2022-01-30 MED ORDER — SODIUM CHLORIDE 0.9 % IV SOLN
3.0000 g | Freq: Three times a day (TID) | INTRAVENOUS | Status: DC
  Filled 2022-01-30: qty 8

## 2022-01-30 MED ORDER — LINEZOLID 600 MG PO TABS
600.0000 mg | ORAL_TABLET | Freq: Two times a day (BID) | ORAL | Status: DC
  Administered 2022-01-30 – 2022-01-31 (×2): 600 mg via ORAL
  Filled 2022-01-30 (×2): qty 1

## 2022-01-30 NOTE — Evaluation (Signed)
Physical Therapy Evaluation ?Patient Details ?Name: [name] ?MRN: [id number] ?DOB: [birth year] ?Today's Date: 01/30/2022 ? ?History of Present Illness ? Pt is a 61 y/o M admitted on 01/21/2022 after being brought in from the outpatient dialysis unit 2/2 unresponsiveness during dialysis. He was admitted  for acute hypoxic respiratory failure from acute on chronic systolic & diastolic CHF, mucous plugging & probable PNA. Unresponsiveness was attributed to acute metabolic encephalopathy.  He required intubation & mechanical ventilation.  He was treated with empiric IV antibiotics.  He underwent bronchoscopy with aspiration of mucous plug.  He was seen by the nephrologist for hemodialysis for CHF exacerbation and ESRD. PMH: HTN, DM2, ESRD on HD, seizure disorder, stroke, chronic systolic & diastolic CHF, chronic anemia, HLD, hidradenitis suppurativa, BLE BKA with prosthetics  ?Clinical Impression ? Pt seen for PT evaluation with co-tx with OT for pt & therapists' safety. Pt received in bed & agreeable to tx, reporting pain in R buttock with pt positioned on R side. PT/OT encouraged pt to attempt sitting EOB to attempt to wash face & change positions but pt requires max<>total assist +2 for rolling L<>R & pt with c/o significant pain in buttocks when therapist attempted to place clean chuck pad underneath him. Pt then appears fatigued & requests to rest. PT educated pt on purpose of therapy (maintain CLOF & current strength levels & to work on pt's goals). Offered to contact pt's wife re: therapy services but pt declines this at this time & just request to rest. Will attempt to see pt on a PT trial basis.   ? ?Recommendations for follow up therapy are one component of a multi-disciplinary discharge planning process, led by the attending physician.  Recommendations may be updated based on patient status, additional functional criteria and insurance authorization. ? ?Follow Up Recommendations Skilled nursing-short term  rehab (<3 hours/day) ? ?  ?Assistance Recommended at Discharge Frequent or constant Supervision/Assistance  ?Patient can return home with the following ? Two people to help with walking and/or transfers;Two people to help with bathing/dressing/bathroom;Direct supervision/assist for medications management;Help with stairs or ramp for entrance;Assist for transportation;Assistance with feeding;Assistance with cooking/housework;Direct supervision/assist for financial management ? ?  ?Equipment Recommendations Wheelchair (measurements PT);Wheelchair cushion (measurements PT);Hospital bed (hoyer lift with sling)  ?Recommendations for Other Services ?    ?  ?Functional Status Assessment Patient has had a recent decline in their functional status and demonstrates the ability to make significant improvements in function in a reasonable and predictable amount of time.  ? ?  ?Precautions / Restrictions Precautions ?Precautions: Fall ?Precaution Comments: hx B BKA with prosthesis ?Restrictions ?Weight Bearing Restrictions: No  ? ?  ? ?Mobility ? Bed Mobility ?Overal bed mobility: Needs Assistance ?Bed Mobility: Rolling ?Rolling: Max assist, Total assist, +2 for physical assistance ?  ?  ?  ?  ?General bed mobility comments: cuing for use/how to use bed rails to assist but pt with little effort ?  ? ?Transfers ?  ?  ?  ?  ?  ?  ?  ?  ?  ?  ?  ? ?Ambulation/Gait ?  ?  ?  ?  ?  ?  ?  ?  ? ?Stairs ?  ?  ?  ?  ?  ? ?Wheelchair Mobility ?  ? ?Modified Rankin (Stroke Patients Only) ?  ? ?  ? ?Balance   ?  ?  ?  ?  ?  ?  ?  ?  ?  ?  ?  ?  ?  ?  ?  ?  ?  ?  ?   ? ? ? ?  Pertinent Vitals/Pain Pain Assessment ?Pain Assessment: Faces ?Faces Pain Scale: Hurts even more ?Pain Location: R hip/buttocks ?Pain Descriptors / Indicators: Grimacing ?Pain Intervention(s): Repositioned, Monitored during session  ? ? ?Home Living Family/patient expects to be discharged to:: Skilled nursing facility ?  ?  ?  ?  ?  ?  ?  ?  ?  ?Additional Comments: Pt  reports he was in rehab prior to admission  ?  ?Prior Function Prior Level of Function : Needs assist ?  ?  ?  ?Physical Assist : Mobility (physical);ADLs (physical) ?  ?  ?Mobility Comments: Pt unable to elaborate/report PLOF, but states he was in rehab so anticipate pt required some assistance. 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  ? ? ?Hand Dominance  ?   ? ?  ?Extremity/Trunk Assessment  ? Upper Extremity Assessment ?Upper Extremity Assessment: Generalized weakness ?  ? ?Lower Extremity Assessment ?Lower Extremity Assessment: Generalized weakness (hx of B BKA) ?  ? ?   ?Communication  ? Communication: No difficulties (decreased volume of verbal communication)  ?Cognition Arousal/Alertness: Lethargic ?Behavior During Therapy: Flat affect ?Overall Cognitive Status: No family/caregiver present to determine baseline cognitive functioning ?Area of Impairment: Following commands ?  ?  ?  ?  ?  ?  ?  ?  ?  ?  ?  ?Following Commands: Follows one step commands inconsistently ?  ?  ?  ?  ?  ?  ? ?  ?General Comments General comments (skin integrity, edema, etc.): pt with sacral wounds ? ?  ?Exercises    ? ?Assessment/Plan  ?  ?PT Assessment Patient needs continued PT services  ?PT Problem List Decreased strength;Decreased mobility;Decreased safety awareness;Decreased balance;Decreased knowledge of use of DME;Pain;Decreased skin integrity;Decreased cognition;Decreased activity tolerance ? ?   ?  ?PT Treatment Interventions DME instruction;Therapeutic activities;Modalities;Gait training;Therapeutic exercise;Patient/family education;Balance training;Wheelchair mobility training;Functional mobility training;Neuromuscular re-education;Manual techniques   ? ?PT Goals (Current goals can be found in the Care Plan section)  ?Acute Rehab PT Goals ?Patient Stated Goal: to rest ?PT Goal Formulation: With patient ?Time For Goal Achievement: 02/13/22 ?Potential to Achieve Goals: Poor ? ?   ?Frequency Min 2X/week ?  ? ? ?Co-evaluation PT/OT/SLP Co-Evaluation/Treatment: Yes ?Reason for Co-Treatment: Complexity of the patient's impairments (multi-system involvement);For patient/therapist safety;To address functional/ADL transfers ?PT goals addressed during session: Mobility/safety with mobility ?  ?  ? ? ?  ?AM-PAC PT "6 Clicks" Mobility  ?Outcome Measure Help needed turning from your back to your side while in a flat bed without using bedrails?: Total ?Help needed moving from lying on your back to sitting on the side of a flat bed without using bedrails?: Total ?Help needed moving to and from a bed to a chair (including a wheelchair)?: Total ?Help needed standing up from a chair using your arms (e.g., wheelchair or bedside chair)?: Total ?Help needed to walk in hospital room?: Total ?Help needed climbing 3-5 steps with a railing? : Total ?6 Click Score: 6 ? ?  ?End of Session   ?Activity Tolerance: Patient limited by fatigue;Patient limited by pain ?Patient left: in bed;with call bell/phone within reach;with bed alarm set ?Nurse Communication: Mobility status ?PT Visit Diagnosis: Difficulty in walking, not elsewhere classified (R26.2);Muscle weakness (generalized) (M62.81);Other abnormalities of gait and mobility (R26.89) ?  ? ?Time: 5638-9373 ?PT Time Calculation (min) (ACUTE ONLY): 14 min ? ? ?Charges:   PT Evaluation ?$PT Eval High Complexity: 1 High ?  ?  ?   ? ? ?  Lavone Nian, PT, DPT ?01/30/22, 1:31 PM ? ? ?Waunita Schooner ?01/30/2022, 1:28 PM ? ?

## 2022-01-30 NOTE — Evaluation (Signed)
Occupational Therapy Evaluation ?Patient Details ?Name: [name] ?MRN: [id number] ?DOB: May 16, 1961 ?Today's Date: 01/30/2022 ? ? ?History of Present Illness Pt is a 61 year old male admitted with acute hypoxic respiratory failure from acute on chronic systolic and diastolic CHF, mucous plugging and probable pneumonia. Pt was was brought to the hospital (from outpatient dialysis unit) because of unresponsiveness during hemodialysis.  Unresponsiveness was attributed to acute metabolic encephalopathy.  PMH significant for hypertension, type 2 diabetes mellitus, ESRD on hemodialysis, seizure disorder, history of stroke, chronic systolic and diastolic CHF, chronic anemia, hyperlipidemia, hidradenitis suppurativa.  ? ?Clinical Impression ?  ?Chart reviewed to date, RN cleared pt for participation in OT evaluation. Co -tx completed with PT on this date. Pt is lethargic on this date, responsive to therapist questions however closing eyes at time and eventually requesting to rest. Poor tolerance for mobility with reported pain with rolling to change chuck pad, MAX A +2 required for rolling. Pt declined further mobility. MAX A-TOTAL A required for all ADL. Pt appears agreeable to trial of OT to assess further tolerance for therapy at this time. Pt is left as received, NAD, all needs met. OT will continue to follow for a trial period.  ?   ? ?Recommendations for follow up therapy are one component of a multi-disciplinary discharge planning process, led by the attending physician.  Recommendations may be updated based on patient status, additional functional criteria and insurance authorization.  ? ?Follow Up Recommendations ? Skilled nursing-short term rehab (<3 hours/day)  ?  ?Assistance Recommended at Discharge Frequent or constant Supervision/Assistance  ?Patient can return home with the following Two people to help with walking and/or transfers;Two people to help with bathing/dressing/bathroom;Help with stairs or ramp  for entrance;Assist for transportation;Direct supervision/assist for financial management;Assistance with cooking/housework ? ?  ?Functional Status Assessment ? Patient has had a recent decline in their functional status and demonstrates the ability to make significant improvements in function in a reasonable and predictable amount of time.  ?Equipment Recommendations ? None recommended by OT;Other (comment) (per next venue of care)  ?  ?Recommendations for Other Services   ? ? ?  ?Precautions / Restrictions Precautions ?Precautions: None ?Precaution Comments: bilaterak BKA  ? ?  ? ?Mobility Bed Mobility ?Overal bed mobility: Needs Assistance ?Bed Mobility: Rolling ?Rolling: Max assist, Total assist, +2 for physical assistance ?  ?  ?  ?  ?General bed mobility comments: with use of bed rails ?  ? ?Transfers ?  ?  ?  ?  ?  ?  ?  ?  ?  ?General transfer comment: pt declined on this date stating "I want to rest" ?  ? ?  ?Balance   ?  ?  ?  ?  ?  ?  ?  ?  ?  ?  ?  ?  ?  ?  ?  ?  ?  ?  ?   ? ?ADL either performed or assessed with clinical judgement  ? ?ADL Overall ADL's : Needs assistance/impaired ?  ?  ?  ?  ?  ?  ?  ?  ?  ?  ?  ?  ?  ?  ?  ?  ?  ?  ?  ?General ADL Comments: MAX A-TOTAL A at this time for all ADL; MAX A for grooming tasks  ? ? ? ?Vision   ?   ?   ?Perception   ?  ?Praxis   ?  ? ?Pertinent  Vitals/Pain Pain Assessment ?Pain Assessment: Faces ?Faces Pain Scale: Hurts little more ?Pain Descriptors / Indicators: Grimacing ?Pain Intervention(s): Repositioned  ? ? ? ?Hand Dominance   ?  ?Extremity/Trunk Assessment Upper Extremity Assessment ?Upper Extremity Assessment: Generalized weakness ?  ?Lower Extremity Assessment ?Lower Extremity Assessment: Generalized weakness (B BKA) ?  ?  ?  ?Communication   ?  ?Cognition Arousal/Alertness: Lethargic ?Behavior During Therapy: Flat affect ?Overall Cognitive Status: No family/caregiver present to determine baseline cognitive functioning ?Area of Impairment: Following  commands ?  ?  ?  ?  ?  ?  ?  ?  ?  ?  ?  ?Following Commands: Follows one step commands inconsistently ?  ?  ?  ?  ?  ?  ?General Comments  pt with sacral wounds ? ?  ?Exercises Other Exercises ?Other Exercises: edu; re role of OT, role of rehab in the hospital ?  ?Shoulder Instructions    ? ? ?Home Living Family/patient expects to be discharged to:: Skilled nursing facility ?  ?  ?  ?  ?  ?  ?  ?  ?  ?  ?  ?  ?  ?  ?  ?  ?  ?  ? ?  ?Prior Functioning/Environment Prior Level of Function : Needs assist ?  ?  ?  ?Physical Assist : Mobility (physical);ADLs (physical) ?  ?  ?  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  ? ?  ?  ?OT Problem List: Decreased strength;Decreased activity tolerance ?  ?   ?OT Treatment/Interventions: Self-care/ADL training;Therapeutic exercise;DME and/or AE instruction;Therapeutic activities;Patient/family education  ?  ?OT Goals(Current goals can be found in the care plan section) Acute Rehab OT Goals ?Patient Stated Goal: rest ?OT Goal Formulation: With patient ?Time For Goal Achievement: 02/13/22 ?Potential to Achieve Goals: Good  ?OT Frequency: Min 2X/week ?  ? ?Co-evaluation   ?  ?  ?  ?  ? ?  ?AM-PAC OT "6 Clicks" Daily Activity     ?Outcome Measure Help from another person eating meals?: A Lot ?Help from another person taking care of personal grooming?: Total ?Help from another person toileting, which includes using toliet, bedpan, or urinal?: Total ?Help from another person bathing (including washing, rinsing, drying)?: Total ?Help from another person to put on and taking off regular upper body clothing?: A Lot ?Help from another person to put on and taking off regular lower body clothing?: Total ?6 Click Score: 8 ?  ?End of Session Nurse Communication: Mobility status ? ?Activity Tolerance: Patient limited by lethargy ?Patient left: in bed;with call bell/phone within reach;with bed alarm set ? ?OT Visit Diagnosis: Muscle weakness (generalized)  (M62.81)  ?              ?Time: 7414-2395 ?OT Time Calculation (min): 14 min ?Charges:  OT General Charges ?$OT Visit: 1 Visit ?OT Evaluation ?$OT Eval Moderate Complexity: 1 Mod ? ?Shanon Payor, OTD OTR/L  ?01/30/22, 1:24 PM  ?

## 2022-01-30 NOTE — Progress Notes (Signed)
?[facility]  ?ROUNDING NOTE  ? ?Subjective:  ? ?Alessander B Agrusa is a 61 year old male with past medical history including seizures, anemia, bilateral BKA, hypertension, hyperlipidemia, mild cognitive impairment, CHF with EF 30 to 35%, BPH, hydradenitis suppurativa, and end-stage renal disease on hemodialysis.  Patient presents to the emergency department after being found unresponsive at dialysis clinic.  Patient has been admitted for ESRD (end stage renal disease) ([place]) [N18.6] ?Acute respiratory failure with hypoxia ([place]) [J96.01] ?Unresponsiveness [R41.89] ? ?Patient is known to our practice and receives outpatient dialysis treatments at [facility] on a Monday Wednesday Friday schedule, supervised by [facility] physicians.   ? ?Update:   ?Patient appears quite weak. ?Has a wet appearing cough ?Very lethargic today.  Did not interact much. ? ? ?Objective:  ?Vital signs in last 24 hours:  ?Temp:  [97.5 ?F (36.4 ?C)-99 ?F (37.2 ?C)] 98.7 ?F (37.1 ?C) (04/15 9826) ?Pulse Rate:  [55-79] 68 (04/15 0731) ?Resp:  [16-24] 19 (04/15 0731) ?BP: (104-165)/(49-103) 153/70 (04/15 0731) ?SpO2:  [90 %-100 %] 94 % (04/15 0731) ?Weight:  [54.8 kg-55.7 kg] 55.7 kg (04/15 0530) ? ?Weight change: -3.6 kg ?Filed Weights  ? 01/29/22 0849 01/29/22 1245 01/30/22 0530  ?Weight: 56.1 kg 54.8 kg 55.7 kg  ? ? ?Intake/Output: ?I/O last 3 completed shifts: ?In: -  ?Out: 1000 [Other:1000] ?  ?Intake/Output this shift: ? No intake/output data recorded. ? ?Physical Exam: ?General: NAD  ?Head: Normocephalic, atraumatic. Moist oral mucosal membranes  ?Eyes: Anicteric  ?Lungs:  Scattered rhonchi, normal effort, [place] O2  ?Heart: Regular rate and rhythm  ?Abdomen:  Soft, nontender, bowel sounds present  ?Extremities: No peripheral edema. LE amputations  ?Neurologic: Awake   ?Skin: No lesions  ?Access: Left aVF  ? ? ?Basic Metabolic Panel: ?Recent Labs  ?Lab 01/26/22 ?0434 01/26/22 ?4158 01/27/22 ?3094 01/28/22 ?2154 01/29/22 ?0768  01/30/22 ?0502  ?NA 138  --  135 136 137 137  ?K 3.1*  --  3.2* 3.4* 3.5 3.1*  ?CL 98  --  99 98 98 99  ?CO2 30  --  27 29 30 30   ?GLUCOSE 74  --  72 138* 115* 78  ?BUN 20  --  16 19 22* 16  ?CREATININE 2.92*  --  2.47* 2.49* 2.77* 1.97*  ?CALCIUM 8.3*  --  8.0* 8.0* 8.6* 8.4*  ?MG  --  1.8  --  1.9  --  1.7  ?PHOS 2.9  --  2.9 3.3  --  2.6  ? ? ? ?Liver Function Tests: ?Recent Labs  ?Lab 02/14/2022 ?1333 01/27/22 ?0940  ?AST 16  --   ?ALT 8  --   ?ALKPHOS 87  --   ?BILITOT 0.7  --   ?PROT 7.8  --   ?ALBUMIN 2.1* 1.7*  ? ? ?No results for input(s): LIPASE, AMYLASE in the last 168 hours. ?No results for input(s): AMMONIA in the last 168 hours. ? ?CBC: ?Recent Labs  ?Lab 02/13/2022 ?1333 01/26/22 ?0434 01/27/22 ?0881 01/28/22 ?2154 01/29/22 ?1031 01/30/22 ?0502  ?WBC 12.6* 10.0 7.8 9.4 7.9 5.1  ?NEUTROABS 10.0*  --   --   --   --   --   ?HGB 10.6* 9.5* 8.7* 8.6* 8.7* 7.9*  ?HCT 37.9* 33.0* 31.0* 30.1* 30.4* 27.6*  ?MCV 90.2 88.5 88.6 87.0 88.1 87.1  ?PLT 202 164 113* 139* 155 121*  ? ? ? ?Cardiac Enzymes: ?No results for input(s): CKTOTAL, CKMB, CKMBINDEX, TROPONINI in the last 168 hours. ? ?BNP: ?Invalid  input(s): POCBNP ? ?CBG: ?Recent Labs  ?Lab 01/29/22 ?2423 01/29/22 ?1341 01/29/22 ?1632 01/29/22 ?2253 01/30/22 ?5361  ?GLUCAP 152* 106* 98 95 72  ? ? ? ?Microbiology: ?Results for orders placed or performed during the hospital encounter of 02/10/2022  ?Resp Panel by RT-PCR (Flu A&B, Covid) Nasopharyngeal Swab     Status: None  ? Collection Time: 01/18/2022  1:35 PM  ? Specimen: Nasopharyngeal Swab; Nasopharyngeal(NP) swabs in vial transport medium  ?Result Value Ref Range Status  ? SARS Coronavirus 2 by RT PCR NEGATIVE NEGATIVE Final  ?  Comment: (NOTE) ?SARS-CoV-2 target nucleic acids are NOT DETECTED. ? ?The SARS-CoV-2 RNA is generally detectable in upper respiratory ?specimens during the acute phase of infection. The lowest ?concentration of SARS-CoV-2 viral copies this assay can detect is ?138 copies/mL. A negative  result does not preclude SARS-Cov-2 ?infection and should not be used as the sole basis for treatment or ?other patient management decisions. A negative result may occur with  ?improper specimen collection/handling, submission of specimen other ?than nasopharyngeal swab, presence of viral mutation(s) within the ?areas targeted by this assay, and inadequate number of viral ?copies(<138 copies/mL). A negative result must be combined with ?clinical observations, patient history, and epidemiological ?information. The expected result is Negative. ? ?Fact Sheet for Patients:  ?EntrepreneurPulse.com.au ? ?Fact Sheet for Healthcare Providers:  ?IncredibleEmployment.be ? ?This test is no t yet approved or cleared by the [place] FDA and  ?has been authorized for detection and/or diagnosis of SARS-CoV-2 by ?FDA under an Emergency Use Authorization (EUA). This EUA will remain  ?in effect (meaning this test can be used) for the duration of the ?COVID-19 declaration under Section 564(b)(1) of the Act, 21 ?U.S.C.section 360bbb-3(b)(1), unless the authorization is terminated  ?or revoked sooner.  ? ? ?  ? Influenza A by PCR NEGATIVE NEGATIVE Final  ? Influenza B by PCR NEGATIVE NEGATIVE Final  ?  Comment: (NOTE) ?The Xpert Xpress SARS-CoV-2/FLU/RSV plus assay is intended as an aid ?in the diagnosis of influenza from Nasopharyngeal swab specimens and ?should not be used as a sole basis for treatment. Nasal washings and ?aspirates are unacceptable for Xpert Xpress SARS-CoV-2/FLU/RSV ?testing. ? ?Fact Sheet for Patients: ?EntrepreneurPulse.com.au ? ?Fact Sheet for Healthcare Providers: ?IncredibleEmployment.be ? ?This test is not yet approved or cleared by the [place] FDA and ?has been authorized for detection and/or diagnosis of SARS-CoV-2 by ?FDA under an Emergency Use Authorization (EUA). This EUA will remain ?in effect (meaning this test can be used)  for the duration of the ?COVID-19 declaration under Section 564(b)(1) of the Act, 21 U.S.C. ?section 360bbb-3(b)(1), unless the authorization is terminated or ?revoked. ? ?Performed at [facility], [place], ?[place] [address] ?  ?Culture, blood (routine x 2) Call MD if unable to obtain prior to antibiotics being given     Status: None  ? Collection Time: 01/24/2022  4:29 PM  ? Specimen: BLOOD  ?Result Value Ref Range Status  ? Specimen Description BLOOD BLOOD RIGHT HAND  Final  ? Special Requests   Final  ?  BOTTLES DRAWN AEROBIC AND ANAEROBIC Blood Culture adequate volume  ? Culture   Final  ?  NO GROWTH 5 DAYS ?Performed at [facility], [address] [place], [place] [address] ?  ? Report Status 01/30/2022 FINAL  Final  ?Culture, blood (routine x 2) Call MD if unable to obtain prior to antibiotics being given     Status: None  ? Collection Time: 02/11/2022  5:54 PM  ? Specimen: BLOOD  ?Result Value Ref Range Status  ? Specimen Description BLOOD BLOOD RIGHT HAND  Final  ? Special Requests   Final  ?  BOTTLES DRAWN AEROBIC AND ANAEROBIC Blood Culture adequate volume  ? Culture   Final  ?  NO GROWTH 5 DAYS ?Performed at [facility], [address], [place], [place] [address] ?  ? Report Status 01/30/2022 FINAL  Final  ?MRSA Next Gen by PCR, Nasal     Status: None  ? Collection Time: 01/16/2022  6:03 PM  ? Specimen: Nasal Mucosa; Nasal Swab  ?Result Value Ref Range Status  ? MRSA by PCR Next Gen NOT DETECTED NOT DETECTED Final  ?  Comment: (NOTE) ?The GeneXpert MRSA Assay (FDA approved for NASAL specimens only), ?is one component of a comprehensive MRSA colonization surveillance ?program. It is not intended to diagnose MRSA infection nor to guide ?or monitor treatment for MRSA infections. ?Test performance is not FDA approved in patients less than 2 years ?old. ?Performed at [facility], [place], ?[place] [address] ?  ?Culture, Respiratory w Gram  Stain     Status: None  ? Collection Time: 01/26/22 12:34 PM  ? Specimen: SPU  ?Result Value Ref Range Status  ? Specimen Description   Final  ?  SPUTUM ?Performed at [facility], [place]

## 2022-01-30 NOTE — Progress Notes (Addendum)
Speech Language Pathology Treatment: Dysphagia  ?Patient Details ?Name: [name] ?MRN: [id number] ?DOB: August 17, 1961 ?Today's Date: 01/30/2022 ?Time: 0925-1010 ?SLP Time Calculation (min) (ACUTE ONLY): 45 min ? ?Assessment / Plan / Recommendation ?Clinical Impression ? Pt seen for ongoing assessment of swallowing; trials to upgrade diet if appropriate today. Yesterday, NSG noted he appeared drowsy and less orally aware w/ residue in his mouth when taking crushed pills. Pt had HD yesterday which may have been fatiguing to pt -- trials to ugprade diet were held yesterday d/t this. This morning, he is alert, verbally responsive and engaged somewhat w/ SLP and NSG during trials and pill swallowing, respectively. Pt is on [place] O2 support; wbc wnl.  ? ?Pt explained general aspiration precautions and agreed verbally to the need for following them, though he needed some encouragement in remaining sitting upright for oral intake/trials -- supported behind the back for full upright sitting. (In between getting po's for pt, he reclined his bed, encouragement given again to sit Upright.)  ?Pt assisted w/ positioning and tray setup d/t min weakness. He was then encouraged to feed himself breakfast meal items and trials of thin liquids via cup/straw. NO overt clinical s/s of aspiration were noted w/ any consistency; no cough, respiratory status remained calm and unlabored, vocal quality clear b/t trials(voice c/b low volume). Oral phase appeared grossly [facility] for bolus management and timely A-P transfer for swallowing of thin liquids and puree; min+ increased Time noted for bolus management and oral clearing w/ increased textured consistencies(mech soft foods). Educated pt and gave hands-on examples of cutting foods small and moistening the foods for easier mastication and oral phase management. ?Pt was also given Pills Whole and crushed(bigger ones) in Puree (nsg present) which he tolerated well; the puree providing cohesion for  swallowing tablets.  ? ?Per Palliative Care notes, pt states "he does not like the thickened liquids". Discussion in the PC note is focused on his Stamps w/ potential transition to more comfort GOC -- see Palliative Care note.  ?Pt does have increased risk for aspiration in setting of acute illness and deconditioned status. Also noted BMI; severe malnutrition per Dietician's notes.   ?At this session today, pt appears at reduced risk for aspiration w/ oral intake when following general aspiration precautions including sitting Upright and taking Small sips w/ thin liquids. Will initiate trial of thin liquids w/ NSG monitoring status at meals.  ? ?Recommend continue a mech soft diet for ease of soft foods w/ gravies(conservation of energy); Thin liquids. Recommend general aspiration precautions; Pills Whole in Puree; tray setup and positioning assistance at meals, w/ feeding assistance at meals as needed. REFLUX precautions. ST services will monitor pt's status and changes in [place] per notes while admitted. NSG updated. Precautions posted on board in room. Pt agreed nodding head he enjoyed the regular coffee he was drinking.   ? ? ? ?  ?HPI HPI: Per 80 H&P "[name] is a 61 y.o. male with medical history significant of ESRD-HD (MWF), hidradenitis suppurativa on [name], hypertension, hyperlipidemia, diabetes mellitus, stroke, peritonitis, ICH, DVT, s/p for bilateral BKA, Crohn's disease, anemia, seizure, PVD, mild cognitive impairment, sCHF with EF 30-35%, BPH, hidradenitis suppurativa on Cosentyx injection, presents with unresponsiveness.     Per report, pt went to dialysis center for his regular dialysis today, but he became unresponsive at about 15 minutes after started dialysis. Pt did not have any injury, particularly no head injury.  Patient was in chair the whole time. No  seizure activity when EMS got there. Pt was noted to have oxygen desaturation to the 70s.  Initially nonrebreather was started but  saturation in the 80s, then BiPAP was started in ED. Per report, patient had multiple secretions that had to be removed. When I saw pt, he already woke up. He is alert and oriented x3.  Moves all extremities.  No facial droop or slurred speech. Patient has cough, denies any chest pain. No nausea, vomiting, diarrhea or abdominal pain.  No symptoms of UTI." ?  ?   ?SLP Plan ? Continue with current plan of care (monitor toleration of diet) ? ?  ?  ?Recommendations for follow up therapy are one component of a multi-disciplinary discharge planning process, led by the attending physician.  Recommendations may be updated based on patient status, additional functional criteria and insurance authorization. ?  ? ?Recommendations  ?Diet recommendations: Dysphagia 3 (mechanical soft);Thin liquid (moistened) ?Liquids provided via: Cup;Straw ?Medication Administration: Whole meds with puree ?Supervision: Patient able to self feed;Intermittent supervision to cue for compensatory strategies (setup support) ?Compensations: Minimize environmental distractions;Slow rate;Small sips/bites;Lingual sweep for clearance of pocketing;Follow solids with liquid ?Postural Changes and/or Swallow Maneuvers: Out of bed for meals;Seated upright 90 degrees;Upright 30-60 min after meal  ?   ?    ?   ? ? ? ? General recommendations:  (Dietician f/u; Palliative Care f/u) ?Oral Care Recommendations: Oral care BID;Oral care before and after PO;Patient independent with oral care (setup support) ?Follow Up Recommendations: Skilled nursing-short term rehab (<3 hours/day) (TBD) ?Assistance recommended at discharge: Intermittent Supervision/Assistance ?SLP Visit Diagnosis: Dysphagia, oral phase (R13.11) (mild) ?Plan: Continue with current plan of care (monitor toleration of diet) ? ? ? ? ?  ?  ? ? ? ? ?Orinda Kenner, MS, CCC-SLP ?Speech Language Pathologist ?Re[facility]; [place] ?[phone] (ascom) ? ?Shariq Puig ? ?01/30/2022, 11:30  AM ?

## 2022-01-30 NOTE — Progress Notes (Addendum)
? ? ? ?Progress Note  ? ? ?[name]  BDZ:[id number] DOB: 08/01/1961  DOA: 01/21/2022 ?PCP: [name], MD  ? ? ? ? ?Brief Narrative:  ? ? ?Medical records reviewed and are as summarized below: ? ?[name] is a 61 y.o. male with medical history significant for hypertension, type 2 diabetes mellitus, ESRD on hemodialysis, seizure disorder, history of stroke, chronic systolic and diastolic CHF, chronic anemia, hyperlipidemia, hidradenitis suppurativa.  He was brought to the hospital (from outpatient dialysis unit) because of unresponsiveness during hemodialysis. ? ?He was admitted to the hospital for acute hypoxic respiratory failure from acute on chronic systolic and diastolic CHF, mucous plugging and probable pneumonia.  Unresponsiveness was attributed to acute metabolic encephalopathy.  He required intubation and mechanical ventilation.  He was treated with empiric IV antibiotics.  He underwent bronchoscopy with aspiration of mucous plug.  He was seen by the nephrologist for hemodialysis for CHF exacerbation and ESRD. ? ?He also has hidradenitis suppurativa which was treated with IV Unasyn.  He was seen by the psychiatrist for depression and he has been started on mirtazapine.  He was transferred from the ICU to the hospitalist service on 01/29/2022. ? ? ?Assessment/Plan:  ? ?Principal Problem: ?  Unresponsiveness ?Active Problems: ?  Acute respiratory failure with hypoxia ([place]) ?  Mucus plugging of bronchi ?  Essential hypertension ?  Crohn disease ([place]) ?  Hidradenitis suppurativa ?  ESRD (end stage renal disease) ([place]) ?  History of CVA (cerebrovascular accident) ?  Seizure disorder ([place]) ?  Chronic systolic CHF (congestive heart failure) ([place]) ?  Anemia in ESRD (end-stage renal disease) ([place]) ?  HLD (hyperlipidemia) ?  Elevated troponin ?  Depression ? ? ?Nutrition Problem: Severe Malnutrition ?Etiology: chronic illness (ESRD on HD, DM, Crohn's disease, CHF, CVA) ? ?Signs/Symptoms: severe fat  depletion, severe muscle depletion ? ? ?Body mass index is 17.13 kg/m?.  (Underweight) ? ? ?Acute hypoxic respiratory failure: Improving.  S/p extubation on 01/27/2022.  Continue 2 L/min oxygen via nasal cannula and taper off as able. ? ?Acute on chronic systolic and diastolic CHF, mildly elevated troponin: Volume management with hemodialysis.  Elevated troponin is thought to be due to demand ischemia. ?2D echo in July 2022 showed EF estimated at 30 to 34%, grade 2 diastolic dysfunction, mild to moderate MR, moderate TR., mildly elevated troponin: ? ?Probable pneumonia, s/p bronchoscopy with aspiration of mucous plug, hidradenitis suppurativa of the gluteal and perineal regions: Wound culture from buttock wound showed vancomycin resistant Enterococcus PCM.  ID recommended additional linezolid for 7 days.  Continue IV Unasyn.   ? ?Acute metabolic encephalopathy: Improved ? ?ESRD on HD: Follow-up with nephrologist for hemodialysis ? ?Hypokalemia: Improved ? ?Type II DM with hypoglycemia: Encourage adequate oral intake.  Monitor glucose levels closely. ? ?Depression: He has been started on mirtazapine.  Appreciate input from psychiatrist. ? ?Generalized weakness: PT and OT recommend discharge to SNF ? ?Other comorbidities include seizure disorder, history of stroke, anemia of chronic disease, type 2 diabetes mellitus, hypertension ? ?Discussed goals of care with the patient.  He wants to discuss this further with his wife. ? ? ?Diet Order   ? ?       ?  DIET DYS 3 Room service appropriate? Yes with Assist; Fluid consistency: Thin  Diet effective now       ?  ? ?  ?  ? ?  ? ? ? ? ? ?Consultants: ?Intensivist ?Nephrologist ?Infectious disease ? ?Procedures: ?Bronchoscopy  on 01/26/2022 ?Intubation on 01/26/2022 ? ? ? ?Medications:  ? ? amLODipine  10 mg Oral QPM  ? vitamin C  500 mg Oral BID  ? atorvastatin  80 mg Oral QHS  ? carvedilol  25 mg Oral BID WC  ? chlorhexidine  15 mL Mouth Rinse BID  ? Chlorhexidine Gluconate  Cloth  6 each Topical Q0600  ? Chlorhexidine Gluconate Cloth  6 each Topical Q0600  ? cloNIDine  0.1 mg Oral BID  ? docusate sodium  100 mg Oral BID  ? [START ON 02/01/2022] epoetin (EPOGEN/PROCRIT) injection  10,000 Units Intravenous Q M,W,F-HD  ? feeding supplement (NEPRO CARB STEADY)  237 mL Oral TID BM  ? gabapentin  100 mg Oral QHS  ? heparin  5,000 Units Subcutaneous Q8H  ? hydrALAZINE  100 mg Oral Q8H  ? HYDROcodone-acetaminophen  1 tablet Oral Daily  ? insulin aspart  0-6 Units Subcutaneous TID WC  ? ipratropium-albuterol  3 mL Nebulization TID  ? isosorbide dinitrate  15 mg Oral BID  ? levETIRAcetam  750 mg Oral Daily  ? linezolid  600 mg Oral Q12H  ? lisinopril  40 mg Oral Daily  ? mouth rinse  15 mL Mouth Rinse q12n4p  ? mirtazapine  7.5 mg Oral QHS  ? multivitamin with minerals  1 tablet Oral Daily  ? pantoprazole (PROTONIX) IV  40 mg Intravenous Q12H  ? polyethylene glycol  17 g Oral Daily  ? sevelamer carbonate  800 mg Oral 2 times per day on Mon Wed Fri  ? sevelamer carbonate  800 mg Oral 3 times per day on Sun Tue Thu Sat  ? ?Continuous Infusions: ? ampicillin-sulbactam (UNASYN) IV    ? ? ? ?Anti-infectives (From admission, onward)  ? ? Start     Dose/Rate Route Frequency Ordered Stop  ? 01/30/22 2200  linezolid (ZYVOX) tablet 600 mg       ?See Hyperspace for full Linked Orders Report.  ? 600 mg Oral Every 12 hours 01/30/22 0646 02/05/22 2159  ? 01/30/22 1800  Ampicillin-Sulbactam (UNASYN) 3 g in sodium chloride 0.9 % 100 mL IVPB       ? 3 g ?200 mL/hr over 30 Minutes Intravenous Every 24 hours 01/30/22 1148    ? 01/30/22 1300  Ampicillin-Sulbactam (UNASYN) 3 g in sodium chloride 0.9 % 100 mL IVPB  Status:  Discontinued       ? 3 g ?200 mL/hr over 30 Minutes Intravenous Every 8 hours 01/30/22 1147 01/30/22 1148  ? 01/30/22 1000  linezolid (ZYVOX) IVPB 600 mg       ?See Hyperspace for full Linked Orders Report.  ? 600 mg ?300 mL/hr over 60 Minutes Intravenous Every 12 hours 01/30/22 0646 01/30/22 1118   ? 01/29/22 1600  linezolid (ZYVOX) tablet 600 mg  Status:  Discontinued       ? 600 mg Oral Every 12 hours 01/29/22 1428 01/30/22 0646  ? 01/26/22 1800  Ampicillin-Sulbactam (UNASYN) 3 g in sodium chloride 0.9 % 100 mL IVPB  Status:  Discontinued       ? 3 g ?200 mL/hr over 30 Minutes Intravenous Every 24 hours 01/26/22 0850 01/30/22 1147  ? 01/31/2022 1600  cefTRIAXone (ROCEPHIN) 1 g in sodium chloride 0.9 % 100 mL IVPB  Status:  Discontinued       ? 1 g ?200 mL/hr over 30 Minutes Intravenous Every 24 hours 01/27/2022 1549 01/26/22 0829  ? 02/06/2022 1600  azithromycin (ZITHROMAX) 500 mg in sodium chloride 0.9 %  250 mL IVPB  Status:  Discontinued       ? 500 mg ?250 mL/hr over 60 Minutes Intravenous Every 24 hours 01/16/2022 1549 01/26/22 0829  ? 02/08/2022 1545  cefTRIAXone (ROCEPHIN) 2 g in sodium chloride 0.9 % 100 mL IVPB  Status:  Discontinued       ? 2 g ?200 mL/hr over 30 Minutes Intravenous  Once 02/05/2022 1530 01/17/2022 1548  ? 01/23/2022 1545  azithromycin (ZITHROMAX) 500 mg in sodium chloride 0.9 % 250 mL IVPB  Status:  Discontinued       ? 500 mg ?250 mL/hr over 60 Minutes Intravenous  Once 01/18/2022 1530 01/28/2022 1548  ? 02/13/2022 1530  levofloxacin (LEVAQUIN) IVPB 750 mg  Status:  Discontinued       ? 750 mg ?100 mL/hr over 90 Minutes Intravenous  Once 02/14/2022 1519 01/26/2022 1530  ? ?  ? ? ? ? ? ? ? ? ? ?Family Communication/Anticipated D/C date and plan/Code Status  ? ?DVT prophylaxis: heparin injection 5,000 Units Start: 01/30/2022 1615 ? ? ?  Code Status: Full Code ? ?Family Communication: None ?Disposition Plan: Plan to discharge home versus SNF when medically stable ? ? ?Status is: Inpatient ?Remains inpatient appropriate because: On IV antibiotics ? ? ? ? ? ? ?Subjective:  ? ?He complains of fatigue and generalized weakness ? ?Objective:  ? ? ?Vitals:  ? 01/30/22 0530 01/30/22 0731 01/30/22 1206 01/30/22 1620  ?BP: (!) 161/73 (!) 153/70 (!) 151/69 (!) 141/70  ?Pulse: 71 68 60 66  ?Resp: 16 19 19 16   ?Temp: 99 ?F  (37.2 ?C) 98.7 ?F (37.1 ?C) 98.9 ?F (37.2 ?C) 98.8 ?F (37.1 ?C)  ?TempSrc:      ?SpO2: 96% 94% 91% 92%  ?Weight: 55.7 kg     ?Height:      ? ?No data found. ? ? ?Intake/Output Summary (Last 24 hours) at 4

## 2022-01-31 DIAGNOSIS — J690 Pneumonitis due to inhalation of food and vomit: Secondary | ICD-10-CM

## 2022-01-31 DIAGNOSIS — R4189 Other symptoms and signs involving cognitive functions and awareness: Secondary | ICD-10-CM | POA: Diagnosis not present

## 2022-01-31 DIAGNOSIS — N186 End stage renal disease: Secondary | ICD-10-CM | POA: Diagnosis not present

## 2022-01-31 DIAGNOSIS — L732 Hidradenitis suppurativa: Secondary | ICD-10-CM

## 2022-01-31 DIAGNOSIS — J9601 Acute respiratory failure with hypoxia: Secondary | ICD-10-CM | POA: Diagnosis not present

## 2022-01-31 LAB — CBC
HCT: 27.8 % — ABNORMAL LOW (ref 39.0–52.0)
Hemoglobin: 7.8 g/dL — ABNORMAL LOW (ref 13.0–17.0)
MCH: 24.6 pg — ABNORMAL LOW (ref 26.0–34.0)
MCHC: 28.1 g/dL — ABNORMAL LOW (ref 30.0–36.0)
MCV: 87.7 fL (ref 80.0–100.0)
Platelets: 127 10*3/uL — ABNORMAL LOW (ref 150–400)
RBC: 3.17 MIL/uL — ABNORMAL LOW (ref 4.22–5.81)
RDW: 20.6 % — ABNORMAL HIGH (ref 11.5–15.5)
WBC: 5.2 10*3/uL (ref 4.0–10.5)
nRBC: 0 % (ref 0.0–0.2)

## 2022-01-31 LAB — GLUCOSE, CAPILLARY
Glucose-Capillary: 93 mg/dL (ref 70–99)
Glucose-Capillary: 94 mg/dL (ref 70–99)

## 2022-01-31 LAB — BASIC METABOLIC PANEL
Anion gap: 7 (ref 5–15)
BUN: 24 mg/dL — ABNORMAL HIGH (ref 6–20)
CO2: 30 mmol/L (ref 22–32)
Calcium: 8.1 mg/dL — ABNORMAL LOW (ref 8.9–10.3)
Chloride: 99 mmol/L (ref 98–111)
Creatinine, Ser: 2.88 mg/dL — ABNORMAL HIGH (ref 0.61–1.24)
GFR, Estimated: 24 mL/min — ABNORMAL LOW (ref >60)
Glucose, Bld: 100 mg/dL — ABNORMAL HIGH (ref 70–99)
Potassium: 3.3 mmol/L — ABNORMAL LOW (ref 3.5–5.1)
Sodium: 136 mmol/L (ref 135–145)

## 2022-01-31 MED ORDER — SODIUM CHLORIDE 0.9 % IV SOLN
3.0000 g | INTRAVENOUS | Status: DC
  Administered 2022-01-31: 3 g via INTRAVENOUS
  Filled 2022-01-31: qty 3

## 2022-01-31 MED ORDER — SODIUM CHLORIDE 0.9 % IV SOLN
3.0000 g | Freq: Two times a day (BID) | INTRAVENOUS | Status: DC
  Filled 2022-01-31: qty 8

## 2022-01-31 MED ORDER — MORPHINE 100MG IN NS 100ML (1MG/ML) PREMIX INFUSION
3.0000 mg/h | INTRAVENOUS | Status: DC
  Administered 2022-01-31: 3 mg/h via INTRAVENOUS
  Filled 2022-01-31: qty 100

## 2022-01-31 MED ORDER — LORAZEPAM 2 MG/ML IJ SOLN
1.0000 mg | INTRAMUSCULAR | Status: DC | PRN
  Administered 2022-02-02: 1 mg via INTRAVENOUS
  Filled 2022-01-31: qty 1

## 2022-01-31 NOTE — Progress Notes (Signed)
Patient has made the decision to no longer do dialysis and does not want to take scheduled medications. Wife is agreeable with patients decision. Dr Mal Misty was made aware and told the family is at bedside if he would like to come and speak with them. Also made dr. Candiss Norse aware that patient no longer wants to receive dialysis.  ?

## 2022-01-31 NOTE — Progress Notes (Addendum)
? ? ? ?Progress Note  ? ? ?[name]  DJM:[id number] DOB: 1961-04-08  DOA: 02/06/2022 ?PCP: [name], MD  ? ? ? ? ?Brief Narrative:  ? ? ?Medical records reviewed and are as summarized below: ? ?[name] is a 61 y.o. male with medical history significant for hypertension, type 2 diabetes mellitus, ESRD on hemodialysis, seizure disorder, history of stroke, chronic systolic and diastolic CHF, chronic anemia, hyperlipidemia, hidradenitis suppurativa.  He was brought to the hospital (from outpatient dialysis unit) because of unresponsiveness during hemodialysis. ? ?He was admitted to the hospital for acute hypoxic respiratory failure from acute on chronic systolic and diastolic CHF, mucous plugging and probable pneumonia.  Unresponsiveness was attributed to acute metabolic encephalopathy.  He required intubation and mechanical ventilation.  He was treated with empiric IV antibiotics.  He underwent bronchoscopy with aspiration of mucous plug.  He was seen by the nephrologist for hemodialysis for CHF exacerbation and ESRD. ? ?He also has hidradenitis suppurativa which was treated with IV Unasyn.  He was seen by the psychiatrist for depression and he has been started on mirtazapine.  He was transferred from the ICU to the hospitalist service on 01/29/2022. ? ? ?Assessment/Plan:  ? ?Principal Problem: ?  Unresponsiveness ?Active Problems: ?  Acute respiratory failure with hypoxia ([place]) ?  Mucus plugging of bronchi ?  Essential hypertension ?  Crohn disease ([place]) ?  Hidradenitis suppurativa ?  ESRD (end stage renal disease) ([place]) ?  History of CVA (cerebrovascular accident) ?  Seizure disorder ([place]) ?  Chronic systolic CHF (congestive heart failure) ([place]) ?  Anemia in ESRD (end-stage renal disease) ([place]) ?  Aspiration pneumonia ([place]) ?  HLD (hyperlipidemia) ?  Elevated troponin ?  Depression ? ? ?Nutrition Problem: Severe Malnutrition ?Etiology: chronic illness (ESRD on HD, DM, Crohn's disease, CHF,  CVA) ? ?Signs/Symptoms: severe fat depletion, severe muscle depletion ? ? ?Body mass index is 17.37 kg/m?.  (Underweight) ? ? ?Acute hypoxic respiratory failure: Improving.  S/p extubation on 01/27/2022.  Continue 2 L/min oxygen via nasal cannula and taper off as able. ? ?Acute on chronic systolic and diastolic CHF, mildly elevated troponin: Volume management with hemodialysis.  Elevated troponin is thought to be due to demand ischemia. ?2D echo in July 2022 showed EF estimated at 30 to 22%, grade 2 diastolic dysfunction, mild to moderate MR, moderate TR., mildly elevated troponin: ? ?Probable pneumonia, s/p bronchoscopy with aspiration of mucous plug, hidradenitis suppurativa of the gluteal and perineal regions: Wound culture from buttock wound showed vancomycin resistant Enterococcus fecium.  Completed IV Unasyn on 02/01/2022 and complete oral Zyvox on 02/05/2022.   ? ?Acute metabolic encephalopathy: Improved ? ?ESRD on HD: Follow-up with nephrologist for hemodialysis ? ?Hypokalemia: Improved ? ?Type II DM with hypoglycemia: Encourage adequate oral intake.  Monitor glucose levels closely. ? ?Depression: He has been started on mirtazapine.  Appreciate input from psychiatrist. ? ?Generalized weakness: PT and OT recommend discharge to SNF ? ?Other comorbidities include seizure disorder, history of stroke, anemia of chronic disease, type 2 diabetes mellitus, hypertension ? ? ? ?ADDENDUM ? ?I spoke with patient and his family Hansel Feinstein, daughter Sherlon Handing) and son-in-law) at the bedside.  Patient requested comfort measures.  He no longer wants to continue with treatment and hemodialysis.  His family is agreeable.  He will be transition to comfort measures.  CODE STATUS has been changed to DNR.  Discontinue all nonessential medications at this point.  Start IV morphine drip.  Consult hospice team  to assist with disposition.  Transfer to [facility]. ? ? ? ?Diet Order   ? ?       ?  DIET DYS 3 Room service appropriate? Yes with  Assist; Fluid consistency: Thin  Diet effective now       ?  ? ?  ?  ? ?  ? ? ? ? ? ?Consultants: ?Intensivist ?Nephrologist ?Infectious disease ? ?Procedures: ?Bronchoscopy on 01/26/2022 ?Intubation on 01/26/2022 ? ? ? ?Medications:  ? ? amLODipine  10 mg Oral QPM  ? vitamin C  500 mg Oral BID  ? atorvastatin  80 mg Oral QHS  ? carvedilol  25 mg Oral BID WC  ? chlorhexidine  15 mL Mouth Rinse BID  ? Chlorhexidine Gluconate Cloth  6 each Topical Q0600  ? Chlorhexidine Gluconate Cloth  6 each Topical Q0600  ? cloNIDine  0.1 mg Oral BID  ? docusate sodium  100 mg Oral BID  ? [START ON 02/01/2022] epoetin (EPOGEN/PROCRIT) injection  10,000 Units Intravenous Q M,W,F-HD  ? feeding supplement (NEPRO CARB STEADY)  237 mL Oral TID BM  ? gabapentin  100 mg Oral QHS  ? heparin  5,000 Units Subcutaneous Q8H  ? hydrALAZINE  100 mg Oral Q8H  ? HYDROcodone-acetaminophen  1 tablet Oral Daily  ? insulin aspart  0-6 Units Subcutaneous TID WC  ? ipratropium-albuterol  3 mL Nebulization TID  ? isosorbide dinitrate  15 mg Oral BID  ? levETIRAcetam  750 mg Oral Daily  ? linezolid  600 mg Oral Q12H  ? lisinopril  40 mg Oral Daily  ? mouth rinse  15 mL Mouth Rinse q12n4p  ? mirtazapine  7.5 mg Oral QHS  ? multivitamin with minerals  1 tablet Oral Daily  ? pantoprazole (PROTONIX) IV  40 mg Intravenous Q12H  ? polyethylene glycol  17 g Oral Daily  ? sevelamer carbonate  800 mg Oral 2 times per day on Mon Wed Fri  ? sevelamer carbonate  800 mg Oral 3 times per day on Sun Tue Thu Sat  ? ?Continuous Infusions: ? ampicillin-sulbactam (UNASYN) IV 3 g (01/31/22 1154)  ? ? ? ?Anti-infectives (From admission, onward)  ? ? Start     Dose/Rate Route Frequency Ordered Stop  ? 01/31/22 1200  Ampicillin-Sulbactam (UNASYN) 3 g in sodium chloride 0.9 % 100 mL IVPB  Status:  Discontinued       ? 3 g ?200 mL/hr over 30 Minutes Intravenous Every 12 hours 01/31/22 1040 01/31/22 1044  ? 01/31/22 1130  Ampicillin-Sulbactam (UNASYN) 3 g in sodium chloride 0.9 % 100  mL IVPB       ? 3 g ?200 mL/hr over 30 Minutes Intravenous Every 24 hours 01/31/22 1043 01/05/27 1129  ? 01/30/22 2200  linezolid (ZYVOX) tablet 600 mg       ?See Hyperspace for full Linked Orders Report.  ? 600 mg Oral Every 12 hours 01/30/22 0646 02/05/22 2159  ? 01/30/22 1800  Ampicillin-Sulbactam (UNASYN) 3 g in sodium chloride 0.9 % 100 mL IVPB  Status:  Discontinued       ? 3 g ?200 mL/hr over 30 Minutes Intravenous Every 24 hours 01/30/22 1148 01/31/22 1040  ? 01/30/22 1300  Ampicillin-Sulbactam (UNASYN) 3 g in sodium chloride 0.9 % 100 mL IVPB  Status:  Discontinued       ? 3 g ?200 mL/hr over 30 Minutes Intravenous Every 8 hours 01/30/22 1147 01/30/22 1148  ? 01/30/22 1000  linezolid (ZYVOX) IVPB 600 mg       ?  See Hyperspace for full Linked Orders Report.  ? 600 mg ?300 mL/hr over 60 Minutes Intravenous Every 12 hours 01/30/22 0646 01/30/22 1118  ? 01/29/22 1600  linezolid (ZYVOX) tablet 600 mg  Status:  Discontinued       ? 600 mg Oral Every 12 hours 01/29/22 1428 01/30/22 0646  ? 01/26/22 1800  Ampicillin-Sulbactam (UNASYN) 3 g in sodium chloride 0.9 % 100 mL IVPB  Status:  Discontinued       ? 3 g ?200 mL/hr over 30 Minutes Intravenous Every 24 hours 01/26/22 0850 01/30/22 1147  ? 01/21/2022 1600  cefTRIAXone (ROCEPHIN) 1 g in sodium chloride 0.9 % 100 mL IVPB  Status:  Discontinued       ? 1 g ?200 mL/hr over 30 Minutes Intravenous Every 24 hours 01/24/2022 1549 01/26/22 0829  ? 01/20/2022 1600  azithromycin (ZITHROMAX) 500 mg in sodium chloride 0.9 % 250 mL IVPB  Status:  Discontinued       ? 500 mg ?250 mL/hr over 60 Minutes Intravenous Every 24 hours 01/24/2022 1549 01/26/22 0829  ? 01/18/2022 1545  cefTRIAXone (ROCEPHIN) 2 g in sodium chloride 0.9 % 100 mL IVPB  Status:  Discontinued       ? 2 g ?200 mL/hr over 30 Minutes Intravenous  Once 02/13/2022 1530 02/13/2022 1548  ? 01/18/2022 1545  azithromycin (ZITHROMAX) 500 mg in sodium chloride 0.9 % 250 mL IVPB  Status:  Discontinued       ? 500 mg ?250 mL/hr over 60  Minutes Intravenous  Once 02/04/2022 1530 01/24/2022 1548  ? 02/08/2022 1530  levofloxacin (LEVAQUIN) IVPB 750 mg  Status:  Discontinued       ? 750 mg ?100 mL/hr over 90 Minutes Intravenous  Once 02/07/2022 1519 04/10/

## 2022-01-31 NOTE — Progress Notes (Signed)
?[facility]  ?ROUNDING NOTE  ? ?Subjective:  ? ?[name] is a 61 year old male with past medical history including seizures, anemia, bilateral BKA, hypertension, hyperlipidemia, mild cognitive impairment, CHF with EF 30 to 35%, BPH, hydradenitis suppurativa, and end-stage renal disease on hemodialysis.  Patient presents to the emergency department after being found unresponsive at dialysis clinic.  Patient has been admitted for ESRD (end stage renal disease) ([place]) [N18.6] ?Acute respiratory failure with hypoxia ([place]) [J96.01] ?Unresponsiveness [R41.89] ? ?Patient is known to our practice and receives outpatient dialysis treatments at [facility] on a Monday Wednesday Friday schedule, supervised by [facility] physicians.   ? ?Update:   ?More alert this morning.  Trying to eat breakfast when seen. ?Denies any acute complaints. ? ? ?Objective:  ?Vital signs in last 24 hours:  ?Temp:  [97.6 ?F (36.4 ?C)-99.4 ?F (37.4 ?C)] 98 ?F (36.7 ?C) (04/16 9038) ?Pulse Rate:  [56-67] 61 (04/16 0738) ?Resp:  [16-19] 19 (04/16 0738) ?BP: (113-151)/(52-72) 145/64 (04/16 3338) ?SpO2:  [88 %-100 %] 100 % (04/16 0738) ?FiO2 (%):  [30 %] 30 % (04/15 2320) ?Weight:  [56.5 kg] 56.5 kg (04/16 0527) ? ?Weight change: 0.4 kg ?Filed Weights  ? 01/29/22 1245 01/30/22 0530 01/31/22 0527  ?Weight: 54.8 kg 55.7 kg 56.5 kg  ? ? ?Intake/Output: ?I/O last 3 completed shifts: ?In: 400 [IV VANVBTYOM:600] ?Out: -  ?  ?Intake/Output this shift: ? No intake/output data recorded. ? ?Physical Exam: ?General: NAD  ?Head: Normocephalic, atraumatic. Moist oral mucosal membranes  ?Eyes: Anicteric  ?Lungs:  Scattered rhonchi, normal effort, [place] O2  ?Heart: Regular rate and rhythm  ?Abdomen:  Soft, nontender, bowel sounds present  ?Extremities: No peripheral edema. LE amputations  ?Neurologic: Awake   ?Skin: No lesions  ?Access: Left aVF  ? ? ?Basic Metabolic Panel: ?Recent Labs  ?Lab 01/26/22 ?0434 01/26/22 ?4599 01/27/22 ?7741 01/28/22 ?2154  01/29/22 ?4239 01/30/22 ?0502 01/31/22 ?0441  ?NA 138  --  135 136 137 137 136  ?K 3.1*  --  3.2* 3.4* 3.5 3.1* 3.3*  ?CL 98  --  99 98 98 99 99  ?CO2 30  --  27 29 30 30 30   ?GLUCOSE 74  --  72 138* 115* 78 100*  ?BUN 20  --  16 19 22* 16 24*  ?CREATININE 2.92*  --  2.47* 2.49* 2.77* 1.97* 2.88*  ?CALCIUM 8.3*  --  8.0* 8.0* 8.6* 8.4* 8.1*  ?MG  --  1.8  --  1.9  --  1.7  --   ?PHOS 2.9  --  2.9 3.3  --  2.6  --   ? ? ? ?Liver Function Tests: ?Recent Labs  ?Lab 01/16/2022 ?1333 01/27/22 ?0940  ?AST 16  --   ?ALT 8  --   ?ALKPHOS 87  --   ?BILITOT 0.7  --   ?PROT 7.8  --   ?ALBUMIN 2.1* 1.7*  ? ? ?No results for input(s): LIPASE, AMYLASE in the last 168 hours. ?No results for input(s): AMMONIA in the last 168 hours. ? ?CBC: ?Recent Labs  ?Lab 02/12/2022 ?1333 01/26/22 ?0434 01/27/22 ?5320 01/28/22 ?2154 01/29/22 ?2334 01/30/22 ?0502 01/31/22 ?0441  ?WBC 12.6*   < > 7.8 9.4 7.9 5.1 5.2  ?NEUTROABS 10.0*  --   --   --   --   --   --   ?HGB 10.6*   < > 8.7* 8.6* 8.7* 7.9* 7.8*  ?HCT 37.9*   < > 31.0* 30.1* 30.4*  27.6* 27.8*  ?MCV 90.2   < > 88.6 87.0 88.1 87.1 87.7  ?PLT 202   < > 113* 139* 155 121* 127*  ? < > = values in this interval not displayed.  ? ? ? ?Cardiac Enzymes: ?No results for input(s): CKTOTAL, CKMB, CKMBINDEX, TROPONINI in the last 168 hours. ? ?BNP: ?Invalid input(s): POCBNP ? ?CBG: ?Recent Labs  ?Lab 01/30/22 ?0824 01/30/22 ?1208 01/30/22 ?1617 01/30/22 ?2341 01/31/22 ?0857  ?GLUCAP 72 113* 101* 123* 93  ? ? ? ?Microbiology: ?Results for orders placed or performed during the hospital encounter of 02/06/2022  ?Resp Panel by RT-PCR (Flu A&B, Covid) Nasopharyngeal Swab     Status: None  ? Collection Time: 01/31/2022  1:35 PM  ? Specimen: Nasopharyngeal Swab; Nasopharyngeal(NP) swabs in vial transport medium  ?Result Value Ref Range Status  ? SARS Coronavirus 2 by RT PCR NEGATIVE NEGATIVE Final  ?  Comment: (NOTE) ?SARS-CoV-2 target nucleic acids are NOT DETECTED. ? ?The SARS-CoV-2 RNA is generally detectable in  upper respiratory ?specimens during the acute phase of infection. The lowest ?concentration of SARS-CoV-2 viral copies this assay can detect is ?138 copies/mL. A negative result does not preclude SARS-Cov-2 ?infection and should not be used as the sole basis for treatment or ?other patient management decisions. A negative result may occur with  ?improper specimen collection/handling, submission of specimen other ?than nasopharyngeal swab, presence of viral mutation(s) within the ?areas targeted by this assay, and inadequate number of viral ?copies(<138 copies/mL). A negative result must be combined with ?clinical observations, patient history, and epidemiological ?information. The expected result is Negative. ? ?Fact Sheet for Patients:  ?EntrepreneurPulse.com.au ? ?Fact Sheet for Healthcare Providers:  ?IncredibleEmployment.be ? ?This test is no t yet approved or cleared by the [place] FDA and  ?has been authorized for detection and/or diagnosis of SARS-CoV-2 by ?FDA under an Emergency Use Authorization (EUA). This EUA will remain  ?in effect (meaning this test can be used) for the duration of the ?COVID-19 declaration under Section 564(b)(1) of the Act, 21 ?U.S.C.section 360bbb-3(b)(1), unless the authorization is terminated  ?or revoked sooner.  ? ? ?  ? Influenza A by PCR NEGATIVE NEGATIVE Final  ? Influenza B by PCR NEGATIVE NEGATIVE Final  ?  Comment: (NOTE) ?The Xpert Xpress SARS-CoV-2/FLU/RSV plus assay is intended as an aid ?in the diagnosis of influenza from Nasopharyngeal swab specimens and ?should not be used as a sole basis for treatment. Nasal washings and ?aspirates are unacceptable for Xpert Xpress SARS-CoV-2/FLU/RSV ?testing. ? ?Fact Sheet for Patients: ?EntrepreneurPulse.com.au ? ?Fact Sheet for Healthcare Providers: ?IncredibleEmployment.be ? ?This test is not yet approved or cleared by the [place] FDA and ?has been  authorized for detection and/or diagnosis of SARS-CoV-2 by ?FDA under an Emergency Use Authorization (EUA). This EUA will remain ?in effect (meaning this test can be used) for the duration of the ?COVID-19 declaration under Section 564(b)(1) of the Act, 21 U.S.C. ?section 360bbb-3(b)(1), unless the authorization is terminated or ?revoked. ? ?Performed at [facility], [place], ?[place] [address] ?  ?Culture, blood (routine x 2) Call MD if unable to obtain prior to antibiotics being given     Status: None  ? Collection Time: 02/05/2022  4:29 PM  ? Specimen: BLOOD  ?Result Value Ref Range Status  ? Specimen Description BLOOD BLOOD RIGHT HAND  Final  ? Special Requests   Final  ?  BOTTLES DRAWN AEROBIC AND ANAEROBIC Blood Culture adequate volume  ? Culture  Final  ?  NO GROWTH 5 DAYS ?Performed at [facility], [address], [place], [place] [address] ?  ? Report Status 01/30/2022 FINAL  Final  ?Culture, blood (routine x 2) Call MD if unable to obtain prior to antibiotics being given     Status: None  ? Collection Time: 01/20/2022  5:54 PM  ? Specimen: BLOOD  ?Result Value Ref Range Status  ? Specimen Description BLOOD BLOOD RIGHT HAND  Final  ? Special Requests   Final  ?  BOTTLES DRAWN AEROBIC AND ANAEROBIC Blood Culture adequate volume  ? Culture   Final  ?  NO GROWTH 5 DAYS ?Performed at [facility], [address] [place], [place] [address] ?  ? Report Status 01/30/2022 FINAL  Final  ?MRSA Next Gen by PCR, Nasal     Status: None  ? Collection Time: 01/20/2022  6:03 PM  ? Specimen: Nasal Mucosa; Nasal Swab  ?Result Value Ref Range Status  ? MRSA by PCR Next Gen NOT DETECTED NOT DETECTED Final  ?  Comment: (NOTE) ?The GeneXpert MRSA Assay (FDA approved for NASAL specimens only), ?is one component of a comprehensive MRSA colonization surveillance ?program. It is not intended to diagnose MRSA infection nor to guide ?or monitor treatment for MRSA infections. ?Test  performance is not FDA approved in patients less than 2 years ?old. ?Performed at [facility], [place], ?[place] [address] ?  ?Culture, Respiratory w Gram Stain     Status: None  ?

## 2022-02-01 DIAGNOSIS — Z7189 Other specified counseling: Secondary | ICD-10-CM | POA: Diagnosis not present

## 2022-02-01 DIAGNOSIS — N186 End stage renal disease: Secondary | ICD-10-CM | POA: Diagnosis not present

## 2022-02-01 DIAGNOSIS — J9601 Acute respiratory failure with hypoxia: Secondary | ICD-10-CM | POA: Diagnosis not present

## 2022-02-01 DIAGNOSIS — R4189 Other symptoms and signs involving cognitive functions and awareness: Secondary | ICD-10-CM | POA: Diagnosis not present

## 2022-02-01 DIAGNOSIS — J690 Pneumonitis due to inhalation of food and vomit: Secondary | ICD-10-CM | POA: Diagnosis not present

## 2022-02-01 LAB — GLUCOSE, CAPILLARY
Glucose-Capillary: 39 mg/dL — CL (ref 70–99)
Glucose-Capillary: 47 mg/dL — ABNORMAL LOW (ref 70–99)

## 2022-02-01 MED ORDER — HYDROMORPHONE HCL 1 MG/ML IJ SOLN
1.0000 mg | INTRAMUSCULAR | Status: DC | PRN
  Administered 2022-02-01 (×2): 1 mg via INTRAVENOUS
  Filled 2022-02-01 (×2): qty 1

## 2022-02-01 MED ORDER — GLYCOPYRROLATE 0.2 MG/ML IJ SOLN
0.1000 mg | Freq: Two times a day (BID) | INTRAMUSCULAR | Status: DC
  Administered 2022-02-01 – 2022-02-02 (×4): 0.1 mg via INTRAVENOUS
  Filled 2022-02-01 (×5): qty 0.5
  Filled 2022-02-01: qty 1

## 2022-02-01 NOTE — Progress Notes (Signed)
?  [facility]  ?ROUNDING NOTE  ? ?Subjective:  ? ?[name] is a 61 year old male with past medical history including seizures, anemia, bilateral BKA, hypertension, hyperlipidemia, mild cognitive impairment, CHF with EF 30 to 35%, BPH, hydradenitis suppurativa, and end-stage renal disease on hemodialysis.  Patient presents to the emergency department after being found unresponsive at dialysis clinic.  Patient has been admitted for ESRD (end stage renal disease) ([place]) [N18.6] ?Acute respiratory failure with hypoxia ([place]) [J96.01] ?Unresponsiveness [R41.89] ? ?Patient is known to our practice and receives outpatient dialysis treatments at [place] on a Monday Wednesday Friday schedule, supervised by [facility] physicians.   ? ?Update:   ?Patient resting comfortably this morning ?Currently eating breakfast with assistance.  ? ?Assessment/ Plan:  ?[name] is a 61 y.o.  male with past medical history including seizures, anemia, bilateral BKA, hypertension, hyperlipidemia, mild cognitive impairment, CHF with EF 30 to 35%, BPH, hydradenitis suppurativa, and end-stage renal disease on hemodialysis.  Patient presents to the emergency department after being found unresponsive at home.  Patient has been admitted for ESRD (end stage renal disease) ([place]) [N18.6] ?Acute respiratory failure with hypoxia ([place]) [J96.01] ?Unresponsiveness [R41.89] ? ?[facility]/MWF/left aVF ? ?End-stage renal disease with hypokalemia on hemodialysis.  Patient has decided to discontinue dialysis and proceed with Hospice services. This was confirmed this morning during my visit.  ? ?2. Anemia of chronic kidney disease ? Normocytic ?Lab Results  ?Component Value Date  ? HGB 7.8 (L) 01/31/2022  ?Mircera received outpatient ? ?3. Secondary Hyperparathyroidism: ?Lab Results  ?Component Value Date  ? PTH 67 (H) 01/26/2022  ? CALCIUM 8.1 (L) 01/31/2022  ? CAION 1.10 (L) 09/12/2020  ? PHOS 2.6 01/30/2022  ?  ?Calcium  and phosphorus at target. ? ?4.  Acute respiratory failure with hypoxemia likely due to right lung collapse from mucous plugging. Pulmonology following and Bronch performed 01/26/22.  Extubated 01/27/22.    ? ?Due to patient wishes for hospice, we will sign off at this time. Feel free to contact us with any concerns or questions.  ? ? ? LOS: 7 ?[place] ?4/17/20231:37 PM ?  ?

## 2022-02-01 NOTE — Progress Notes (Signed)
SLP Cancellation Note ? ?Patient Details ?Name: [name] ?MRN: [id number] ?DOB: 21-Feb-1961 ? ? ?Cancelled treatment:       Reason Eval/Treat Not Completed:  (chart reviewed; consulted NSG) ?Per chart notes, pt was made [place] by Attending Team yesterday; Palliative Care following today w/ family. Hospice Home is evaluating today.  ?No changes in diet recommended; do recommend continued aspiration precautions and support at meals. ST services can be available if any further needs while admitted.  ? ? ? ? ?Orinda Kenner, MS, CCC-SLP ?Speech Language Pathologist ?Rehab Services; [place] ?[phone] (ascom) ?Tarance Balan ?02/01/2022, 3:02 PM ?

## 2022-02-01 NOTE — Progress Notes (Addendum)
? ?                                                                                                                                                     ?                                                   ?Daily Progress Note  ? ?Patient Name: [name]       Date: 02/01/2022 ?DOB: [birth year]  Age: 61 y.o. MRN#: [id number] ?Attending Physician: Jennye Boroughs, MD ?Primary Care Physician: [name], MD ?Admit Date: 01/30/2022 ? ?Reason for Consultation/Follow-up: Establishing goals of care and Terminal Care ? ?Subjective: ?Notes and labs reviewed. Patient was made comfort care by attending team yesterday. In to see patient. Sister is at bedside with other family members. Patient opens his eyes to look at me, but does not speak, and closes his eyes again. No distress noted at this time. Met wife walking out of room. She is at peace with the decision and is grateful for his care. Educated on dying process, and his care moving forward. Answered questions. She discusses the children, and family dynamics.  Recommend working towards hospice facility placement.  ? ?Length of Stay: 7 ? ?Current Medications: ?Scheduled Meds:  ? chlorhexidine  15 mL Mouth Rinse BID  ? Chlorhexidine Gluconate Cloth  6 each Topical Q0600  ? Chlorhexidine Gluconate Cloth  6 each Topical Q0600  ? feeding supplement (NEPRO CARB STEADY)  237 mL Oral TID BM  ? glycopyrrolate  0.1 mg Intravenous BID  ? HYDROcodone-acetaminophen  1 tablet Oral Daily  ? mouth rinse  15 mL Mouth Rinse q12n4p  ? ? ?Continuous Infusions: ? morphine 3 mg/hr (01/31/22 1657)  ? ? ?PRN Meds: ?acetaminophen (TYLENOL) oral liquid 160 mg/5 mL, albuterol, LORazepam ? ?Physical Exam ?Pulmonary:  ?   Effort: Pulmonary effort is normal.  ?Neurological:  ?   Mental Status: He is alert.  ?         ? ?Vital Signs: BP (!) 151/73 (BP Location: Left Arm)   Pulse 68   Temp 98.4 ?F (36.9 ?C) (Oral)   Resp 12   Ht _0  (1.803 m)   Wt 56.5 kg   SpO2 (!) 84%   BMI 17.37 kg/m?   ?SpO2: SpO2: (!) 84 % ?O2 Device: O2 Device: Nasal Cannula ?O2 Flow Rate: O2 Flow Rate (L/min): 2 L/min ? ?Intake/output summary:  ?Intake/Output Summary (Last 24 hours) at 02/01/2022 1114 ?Last data filed at 02/01/2022 0300 ?Gross per 24 hour  ?Intake 100 ml  ?Output 200 ml  ?Net -100 ml  ? ?LBM: Last BM Date : 01/30/22 ?Baseline Weight: Weight:  75.3 kg ?Most recent weight: Weight: 56.5 kg ? ? ?Patient Active Problem List  ? Diagnosis Date Noted  ? Depression 01/28/2022  ? Unresponsiveness 02/01/2022  ? Mucus plugging of bronchi 01/17/2022  ? HLD (hyperlipidemia) 01/22/2022  ? Elevated troponin 02/12/2022  ? Multifocal pneumonia   ? Type 2 diabetes mellitus with diabetic neuropathy, without long-term current use of insulin ([place]) 07/27/2021  ? Anemia in ESRD (end-stage renal disease) ([place]) 07/27/2021  ? Hypoglycemia 07/27/2021  ? Hypothermia 07/27/2021  ? COVID-19 virus infection 07/27/2021  ? Aspiration pneumonia ([place]) 07/27/2021  ? Unstageable pressure ulcer of sacral region ([place]) 05/22/2021  ? Acute decompensated heart failure ([place]) 05/19/2021  ? Acute respiratory failure with hypoxia ([place]) 05/18/2021  ? Protein-calorie malnutrition, severe 05/14/2021  ? Acute pulmonary edema (HCC)   ? Shortness of breath   ? Fluid overload 05/12/2021  ? Cellulitis and abscess of buttock   ? History of GI bleed 02/06/2021  ? Long term current use of immunosuppressive drug 02/06/2021  ? Disorder of skin due to Crohn's disease ([place]) 02/06/2021  ? Complication of vascular access for dialysis 01/22/2021  ? Polyp of transverse colon   ? Acute metabolic encephalopathy 17/79/3903  ? Acute on chronic anemia 11/18/2020  ? Chronic systolic CHF (congestive heart failure) ([place]) 11/18/2020  ? Cerebrovascular disease 09/12/2020  ? Transient alteration of awareness 09/12/2020  ? Hyperkalemia 09/12/2020  ? Mild cognitive impairment 09/30/2019  ? Peripheral vascular disease ([place]) 06/28/2018  ? Sepsis ([place]) 01/12/2018  ? Pressure injury of skin  12/04/2017  ? S/P bilateral BKA (below knee amputation) ([place]) 10/20/2017  ? Atherosclerosis of native arteries of extremity with rest pain ([place]) 08/05/2017  ? History of CVA (cerebrovascular accident) 04/15/2017  ? Seizure disorder ([place]) 04/15/2017  ? ESRD (end stage renal disease) ([place]) 04/12/2016  ? Aphthae 02/20/2016  ? Hidradenitis suppurativa 02/20/2016  ? Leg pain 02/20/2016  ? Neuropathy 02/20/2016  ? Narrowing of intervertebral disc space 08/29/2015  ? Vascular disorder of lower extremity 08/29/2015  ? Failure of erection 08/29/2015  ? Hyperlipidemia 08/29/2015  ? Essential hypertension 08/29/2015  ? Anemia due to chronic kidney disease 05/26/2015  ? Venous insufficiency of leg 09/04/2014  ? History of deep vein thrombosis (DVT) of lower extremity 08/16/2014  ? Prostatic intraepithelial neoplasia 11/02/2013  ? Elevated prostate specific antigen (PSA) 09/11/2013  ? Benign prostatic hyperplasia with urinary obstruction 08/13/2013  ? Spermatocele 08/13/2013  ? Avitaminosis D 01/25/2013  ? Type 2 diabetes mellitus with hypoglycemia without coma ([place]) 03/28/2012  ? Crohn disease ([place]) 08/03/2011  ? ? ?Palliative Care Assessment & Plan  ? ? ?Recommendations/Plan: ?Patient is comfortable at this time. Attending team could rotate Morphine infusion to Dilaudid or Fentanyl as patient has renal failure.  ? ?Would recommend working towards hospice facility placement.  ? ? ? ? ?Code Status: ? ?  ?Code Status Orders  ?(From admission, onward)  ?  ? ? ?  ? ?  Start     Ordered  ? 01/31/22 1622  Do not attempt resuscitation (DNR)  Continuous       ?Question Answer Comment  ?In the event of cardiac or respiratory ARREST Do not call a ?code blue?   ?In the event of cardiac or respiratory ARREST Do not perform Intubation, CPR, defibrillation or ACLS   ?In the event of cardiac or respiratory ARREST Use medication by any route, position, wound care, and other measures to relive pain and suffering. May use oxygen, suction and  manual treatment  of airway obstruction as needed for comfort.   ?  ? 01/31/22 1622  ? ?  ?  ? ?  ? ?Code Status History   ? ? Date Active Date Inactive Code Status Order ID Comments User Context  ? 01/19/2022 1606 01/31/2022 1622 Full Code [id number]  Ivor Costa, MD ED  ? 11/06/2021 2011 11/12/2021 1916 Full Code [id number]  Collier Bullock, MD Inpatient  ? 07/27/2021 1321 08/02/2021 2249 Full Code [id number]  Ivor Costa, MD ED  ? 06/09/2021 1039 06/13/2021 2348 Full Code [id number]  Collier Bullock, MD ED  ? 05/18/2021 0539 05/26/2021 2146 Full Code [id number]  Rise Patience, MD ED  ? 05/12/2021 2306 05/15/2021 2240 Full Code [id number]  Clance Boll, MD ED  ? 02/06/2021 2254 02/10/2021 2225 Full Code [id number]  Athena Masse, MD ED  ? 11/18/2020 1508 11/25/2020 1534 Full Code [id number]  Ivor Costa, MD ED  ? 09/12/2020 1917 09/14/2020 2156 Full Code [id number]  Para Skeans, MD ED  ? 01/05/2019 2016 01/08/2019 1759 Full Code [id number]  Fritzi Mandes, MD Inpatient  ? 12/04/2017 0511 12/12/2017 2355 Full Code [id number]  SalaryAvel Peace, MD Inpatient  ? 08/22/2017 1244 08/22/2017 1800 Full Code [id number]  Algernon Huxley, MD Inpatient  ? 04/21/2017 1819 04/25/2017 1915 Full Code [id number]  Dustin Flock, MD Inpatient  ? 11/06/2015 2035 11/07/2015 1818 Full Code [id number]  Aldean Jewett, MD ED  ? ?  ? ? ?Prognosis: ? < 2 weeks ? ? ? ?Care plan was discussed with Attending and TOC by epic chat.  ? ?Thank you for allowing the Palliative Medicine Team to assist in the care of this patient. ? ?Asencion Gowda, NP ? ?Please contact Palliative Medicine Team phone at [phone] for questions and concerns.  ? ? ? ? ? ?

## 2022-02-01 NOTE — Progress Notes (Addendum)
? ? ? ?Progress Note  ? ? ?[name]  HKV:[id number] DOB: 1961/03/19  DOA: 01/20/2022 ?PCP: [name], MD  ? ? ? ? ?Brief Narrative:  ? ? ?Medical records reviewed and are as summarized below: ? ?[name] is a 61 y.o. male with medical history significant for hypertension, type 2 diabetes mellitus, ESRD on hemodialysis, seizure disorder, history of stroke, chronic systolic and diastolic CHF, chronic anemia, hyperlipidemia, hidradenitis suppurativa.  He was brought to the hospital (from outpatient dialysis unit) because of unresponsiveness during hemodialysis. ? ?He was admitted to the hospital for acute hypoxic respiratory failure from acute on chronic systolic and diastolic CHF, mucous plugging and probable pneumonia.  Unresponsiveness was attributed to acute metabolic encephalopathy.  He required intubation and mechanical ventilation.  He was treated with empiric IV antibiotics.  He underwent bronchoscopy with aspiration of mucous plug.  He was seen by the nephrologist for hemodialysis for CHF exacerbation and ESRD. ? ?He also has hidradenitis suppurativa which was treated with IV Unasyn.  He was seen by the psychiatrist for depression and he has been started on mirtazapine.  He was transferred from the ICU to the hospitalist service on 01/29/2022. ? ? ?Assessment/Plan:  ? ?Principal Problem: ?  Unresponsiveness ?Active Problems: ?  Acute respiratory failure with hypoxia ([place]) ?  Mucus plugging of bronchi ?  Essential hypertension ?  Crohn disease ([place]) ?  Hidradenitis suppurativa ?  ESRD (end stage renal disease) ([place]) ?  History of CVA (cerebrovascular accident) ?  Seizure disorder ([place]) ?  Chronic systolic CHF (congestive heart failure) ([place]) ?  Anemia in ESRD (end-stage renal disease) ([place]) ?  Aspiration pneumonia ([place]) ?  HLD (hyperlipidemia) ?  Elevated troponin ?  Depression ? ? ?Nutrition Problem: Severe Malnutrition ?Etiology: chronic illness (ESRD on HD, DM, Crohn's disease, CHF,  CVA) ? ?Signs/Symptoms: severe fat depletion, severe muscle depletion ? ? ?Body mass index is 17.37 kg/m?.  (Underweight) ? ?Acute hypoxic respiratory failure ?Acute on chronic systolic and diastolic CHF ?ESRD on hemodialysis ?Probable aspiration pneumonia ?Mucous plug of bronchi ?Elevated troponin ?Hidradenitis suppurativa ?Acute metabolic encephalopathy/unresponsiveness ?Generalized weakness ?Severe protein caloric malnutrition ?Seizure disorder ?History of stroke ?Anemia of chronic disease ?Hyperlipidemia ?Hypertension ?Type 2 diabetes mellitus ?Depression ? ? ? ?PLAN ? ?Discontinue IV morphine drip.  Dilaudid as needed for pain ?Continue IV Ativan as needed for anxiety ?Robinul has been ordered for secretions. ?Awaiting placement to hospice facility ?Plan discussed with his family at the bedside ? ? ?Diet Order   ? ?       ?  DIET DYS 3 Room service appropriate? Yes with Assist; Fluid consistency: Thin  Diet effective now       ?  ? ?  ?  ? ?  ? ? ? ? ? ?Consultants: ?Intensivist ?Nephrologist ?Infectious disease ?Palliative care and hospice ? ?Procedures: ?Bronchoscopy on 01/26/2022 ?Intubation on 01/26/2022 ? ? ? ?Medications:  ? ? chlorhexidine  15 mL Mouth Rinse BID  ? Chlorhexidine Gluconate Cloth  6 each Topical Q0600  ? Chlorhexidine Gluconate Cloth  6 each Topical Q0600  ? feeding supplement (NEPRO CARB STEADY)  237 mL Oral TID BM  ? glycopyrrolate  0.1 mg Intravenous BID  ? HYDROcodone-acetaminophen  1 tablet Oral Daily  ? mouth rinse  15 mL Mouth Rinse q12n4p  ? ?Continuous Infusions: ? ? ? ? ?Anti-infectives (From admission, onward)  ? ? Start     Dose/Rate Route Frequency Ordered Stop  ? 01/31/22 1200  Ampicillin-Sulbactam (UNASYN) 3 g in sodium chloride 0.9 % 100 mL IVPB  Status:  Discontinued       ? 3 g ?200 mL/hr over 30 Minutes Intravenous Every 12 hours 01/31/22 1040 01/31/22 1044  ? 01/31/22 1130  Ampicillin-Sulbactam (UNASYN) 3 g in sodium chloride 0.9 % 100 mL IVPB  Status:  Discontinued        ? 3 g ?200 mL/hr over 30 Minutes Intravenous Every 24 hours 01/31/22 1043 01/31/22 1622  ? 01/30/22 2200  linezolid (ZYVOX) tablet 600 mg  Status:  Discontinued       ?See Hyperspace for full Linked Orders Report.  ? 600 mg Oral Every 12 hours 01/30/22 0646 01/31/22 1622  ? 01/30/22 1800  Ampicillin-Sulbactam (UNASYN) 3 g in sodium chloride 0.9 % 100 mL IVPB  Status:  Discontinued       ? 3 g ?200 mL/hr over 30 Minutes Intravenous Every 24 hours 01/30/22 1148 01/31/22 1040  ? 01/30/22 1300  Ampicillin-Sulbactam (UNASYN) 3 g in sodium chloride 0.9 % 100 mL IVPB  Status:  Discontinued       ? 3 g ?200 mL/hr over 30 Minutes Intravenous Every 8 hours 01/30/22 1147 01/30/22 1148  ? 01/30/22 1000  linezolid (ZYVOX) IVPB 600 mg       ?See Hyperspace for full Linked Orders Report.  ? 600 mg ?300 mL/hr over 60 Minutes Intravenous Every 12 hours 01/30/22 0646 01/30/22 1118  ? 01/29/22 1600  linezolid (ZYVOX) tablet 600 mg  Status:  Discontinued       ? 600 mg Oral Every 12 hours 01/29/22 1428 01/30/22 0646  ? 01/26/22 1800  Ampicillin-Sulbactam (UNASYN) 3 g in sodium chloride 0.9 % 100 mL IVPB  Status:  Discontinued       ? 3 g ?200 mL/hr over 30 Minutes Intravenous Every 24 hours 01/26/22 0850 01/30/22 1147  ? 02/01/2022 1600  cefTRIAXone (ROCEPHIN) 1 g in sodium chloride 0.9 % 100 mL IVPB  Status:  Discontinued       ? 1 g ?200 mL/hr over 30 Minutes Intravenous Every 24 hours 01/17/2022 1549 01/26/22 0829  ? 02/02/2022 1600  azithromycin (ZITHROMAX) 500 mg in sodium chloride 0.9 % 250 mL IVPB  Status:  Discontinued       ? 500 mg ?250 mL/hr over 60 Minutes Intravenous Every 24 hours 02/14/2022 1549 01/26/22 0829  ? 01/27/2022 1545  cefTRIAXone (ROCEPHIN) 2 g in sodium chloride 0.9 % 100 mL IVPB  Status:  Discontinued       ? 2 g ?200 mL/hr over 30 Minutes Intravenous  Once 02/06/2022 1530 01/17/2022 1548  ? 01/23/2022 1545  azithromycin (ZITHROMAX) 500 mg in sodium chloride 0.9 % 250 mL IVPB  Status:  Discontinued       ? 500 mg ?250  mL/hr over 60 Minutes Intravenous  Once 02/09/2022 1530 01/27/2022 1548  ? 01/28/2022 1530  levofloxacin (LEVAQUIN) IVPB 750 mg  Status:  Discontinued       ? 750 mg ?100 mL/hr over 90 Minutes Intravenous  Once 01/17/2022 1519 02/02/2022 1530  ? ?  ? ? ? ? ? ? ? ? ? ?Family Communication/Anticipated D/C date and plan/Code Status  ? ?DVT prophylaxis:  ? ? ?  Code Status: DNR ? ?Family Communication: None ?Disposition Plan: Plan to discharge to hospice facility when bed becomes available  ? ? ?Status is: Inpatient ?Remains inpatient appropriate because: Awaiting placement to hospice house ? ? ? ? ? ? ?Subjective:  ? ?[name]  awaiting placement to hospice houseal events noted.  He does not provide much history.  He complains of generalized weakness and poor appetite. ? ?Objective:  ? ? ?Vitals:  ? 01/31/22 0738 01/31/22 1142 01/31/22 2050 02/01/22 0819  ?BP: (!) 145/64 134/63 139/66 (!) 151/73  ?Pulse: 61 (!) 56 62 68  ?Resp: 19 20 20 12   ?Temp: 98 ?F (36.7 ?C) 98.4 ?F (36.9 ?C) 99.3 ?F (37.4 ?C) 98.4 ?F (36.9 ?C)  ?TempSrc:   Oral Oral  ?SpO2: 100% 99% 94% (!) 84%  ?Weight:      ?Height:      ? ?No data found. ? ? ?Intake/Output Summary (Last 24 hours) at 02/01/2022 1532 ?Last data filed at 02/01/2022 1500 ?Gross per 24 hour  ?Intake 56.57 ml  ?Output 200 ml  ?Net -143.43 ml  ? ?Filed Weights  ? 01/29/22 1245 01/30/22 0530 01/31/22 0527  ?Weight: 54.8 kg 55.7 kg 56.5 kg  ? ? ?Exam: ? ?GEN: NAD, frail, ill-looking ?SKIN: Wounds on buttocks and perineum ?EYES: No pallor or icterus ?ENT: MMM ?CV: RRR ?PULM: CTA B ?ABD: soft, ND, NT, +BS ?CNS: AAO x 3, non focal ?EXT: Bilateral BKA.  No edema or tenderness ? ? ? ? ? ? ? ?Data Reviewed:  ? ?I have personally reviewed following labs and imaging studies: ? ?Labs: ?Labs show the following:  ? ?Basic Metabolic Panel: ?Recent Labs  ?Lab 01/26/22 ?0434 01/26/22 ?7793 01/27/22 ?9688 01/28/22 ?2154 01/29/22 ?6484 01/30/22 ?0502 01/31/22 ?0441  ?NA 138  --  135 136 137 137 136  ?K 3.1*  --   3.2* 3.4* 3.5 3.1* 3.3*  ?CL 98  --  99 98 98 99 99  ?CO2 30  --  27 29 30 30 30   ?GLUCOSE 74  --  72 138* 115* 78 100*  ?BUN 20  --  16 19 22* 16 24*  ?CREATININE 2.92*  --  2.47* 2.49* 2.77* 1.97* 2.88*  ?CALCIUM

## 2022-02-01 NOTE — Progress Notes (Signed)
Manufacturing engineer [facility]) Hospital Liaison Note ? ?Received request from [employer] [place] for family interest in [facility]. Visited patient at bedside and spoke with wife/Carmen/[phone] to confirm interest and explain services. ? ?Approval for Hospice Home is determined by [facility] MD. Once [facility] MD has determined Hospice Home eligibility, [place] will update hospital staff and family. ? ?Please do not hesitate to call with any hospice related questions.  ?  ?Thank you for the opportunity to participate in this patient's care. ? ?Daphene Calamity, MSW ?[place]  ?[phone] ? ?

## 2022-02-02 DIAGNOSIS — N186 End stage renal disease: Secondary | ICD-10-CM | POA: Diagnosis not present

## 2022-02-02 DIAGNOSIS — J690 Pneumonitis due to inhalation of food and vomit: Secondary | ICD-10-CM | POA: Diagnosis not present

## 2022-02-02 DIAGNOSIS — T17500A Unspecified foreign body in bronchus causing asphyxiation, initial encounter: Secondary | ICD-10-CM | POA: Diagnosis not present

## 2022-02-02 DIAGNOSIS — Z7189 Other specified counseling: Secondary | ICD-10-CM | POA: Diagnosis not present

## 2022-02-02 DIAGNOSIS — R4189 Other symptoms and signs involving cognitive functions and awareness: Secondary | ICD-10-CM | POA: Diagnosis not present

## 2022-02-02 DIAGNOSIS — Z515 Encounter for palliative care: Secondary | ICD-10-CM

## 2022-02-02 DIAGNOSIS — I5022 Chronic systolic (congestive) heart failure: Secondary | ICD-10-CM | POA: Diagnosis not present

## 2022-02-02 NOTE — Progress Notes (Signed)
? ?                                                                                                                                                     ?                                                   ?Daily Progress Note  ? ?Patient Name: [name]       Date: 02/02/2022 ?DOB: [birth year]  Age: 61 y.o. MRN#: [id number] ?Attending Physician: Jennye Boroughs, MD ?Primary Care Physician: [name], MD ?Admit Date: 01/28/2022 ? ?Reason for Consultation/Follow-up: Establishing goals of care ? ?Subjective: ?Patient is resting in bed with family at bedside. He appears comfortable and denies complaint. Patient is waiting for hospice facility placement. No changes to symptom management regimen recommended.  ? ?Length of Stay: 8 ? ?Current Medications: ?Scheduled Meds:  ? chlorhexidine  15 mL Mouth Rinse BID  ? Chlorhexidine Gluconate Cloth  6 each Topical Q0600  ? Chlorhexidine Gluconate Cloth  6 each Topical Q0600  ? feeding supplement (NEPRO CARB STEADY)  237 mL Oral TID BM  ? glycopyrrolate  0.1 mg Intravenous BID  ? HYDROcodone-acetaminophen  1 tablet Oral Daily  ? mouth rinse  15 mL Mouth Rinse q12n4p  ? ? ?Continuous Infusions: ? ? ?PRN Meds: ?acetaminophen (TYLENOL) oral liquid 160 mg/5 mL, albuterol, HYDROmorphone (DILAUDID) injection, LORazepam ? ?Physical Exam ?Pulmonary:  ?   Effort: Pulmonary effort is normal.  ?Neurological:  ?   Mental Status: He is alert.  ?         ? ?Vital Signs: BP (!) 143/74 (BP Location: Right Arm)   Pulse 78   Temp 98.2 ?F (36.8 ?C)   Resp 16   Ht 5' 11"  (1.803 m)   Wt 56.5 kg   SpO2 (!) 89%   BMI 17.37 kg/m?  ?SpO2: SpO2: (!) 89 % ?O2 Device: O2 Device: Nasal Cannula ?O2 Flow Rate: O2 Flow Rate (L/min): 2 L/min ? ?Intake/output summary:  ?Intake/Output Summary (Last 24 hours) at 02/02/2022 1259 ?Last data filed at 02/01/2022 1500 ?Gross per 24 hour  ?Intake 0 ml  ?Output --  ?Net 0 ml  ? ?LBM: Last BM Date : 01/30/22 ?Baseline Weight: Weight: 75.3 kg ?Most recent  weight: Weight: 56.5 kg ? ? ? ?Patient Active Problem List  ? Diagnosis Date Noted  ? Depression 01/28/2022  ? Unresponsiveness 02/11/2022  ? Mucus plugging of bronchi 02/04/2022  ? HLD (hyperlipidemia) 02/05/2022  ? Elevated troponin 01/17/2022  ? Multifocal pneumonia   ? Type 2 diabetes mellitus with diabetic neuropathy, without long-term current use of insulin ([place]) 07/27/2021  ? Anemia in ESRD (  end-stage renal disease) ([place]) 07/27/2021  ? Hypoglycemia 07/27/2021  ? Hypothermia 07/27/2021  ? COVID-19 virus infection 07/27/2021  ? Aspiration pneumonia ([place]) 07/27/2021  ? Unstageable pressure ulcer of sacral region ([place]) 05/22/2021  ? Acute decompensated heart failure ([place]) 05/19/2021  ? Acute respiratory failure with hypoxia ([place]) 05/18/2021  ? Protein-calorie malnutrition, severe 05/14/2021  ? Acute pulmonary edema ([facility])   ? Shortness of breath   ? Fluid overload 05/12/2021  ? Cellulitis and abscess of buttock   ? History of GI bleed 02/06/2021  ? Long term current use of immunosuppressive drug 02/06/2021  ? Disorder of skin due to Crohn's disease ([place]) 02/06/2021  ? Complication of vascular access for dialysis 01/22/2021  ? Polyp of transverse colon   ? Acute metabolic encephalopathy 71/21/9758  ? Acute on chronic anemia 11/18/2020  ? Chronic systolic CHF (congestive heart failure) ([place]) 11/18/2020  ? Cerebrovascular disease 09/12/2020  ? Transient alteration of awareness 09/12/2020  ? Hyperkalemia 09/12/2020  ? Mild cognitive impairment 09/30/2019  ? Peripheral vascular disease ([place]) 06/28/2018  ? Sepsis ([place]) 01/12/2018  ? Pressure injury of skin 12/04/2017  ? S/P bilateral BKA (below knee amputation) ([place]) 10/20/2017  ? Atherosclerosis of native arteries of extremity with rest pain ([place]) 08/05/2017  ? History of CVA (cerebrovascular accident) 04/15/2017  ? Seizure disorder ([place]) 04/15/2017  ? ESRD (end stage renal disease) ([place]) 04/12/2016  ? Aphthae 02/20/2016  ? Hidradenitis suppurativa 02/20/2016  ? Leg  pain 02/20/2016  ? Neuropathy 02/20/2016  ? Narrowing of intervertebral disc space 08/29/2015  ? Vascular disorder of lower extremity 08/29/2015  ? Failure of erection 08/29/2015  ? Hyperlipidemia 08/29/2015  ? Essential hypertension 08/29/2015  ? Anemia due to chronic kidney disease 05/26/2015  ? Venous insufficiency of leg 09/04/2014  ? History of deep vein thrombosis (DVT) of lower extremity 08/16/2014  ? Prostatic intraepithelial neoplasia 11/02/2013  ? Elevated prostate specific antigen (PSA) 09/11/2013  ? Benign prostatic hyperplasia with urinary obstruction 08/13/2013  ? Spermatocele 08/13/2013  ? Avitaminosis D 01/25/2013  ? Type 2 diabetes mellitus with hypoglycemia without coma ([place]) 03/28/2012  ? Crohn disease ([place]) 08/03/2011  ? ? ?Palliative Care Assessment & Plan  ? ? ?Recommendations/Plan: ?Patient on full comfort care waiting for placement at hospice facility.  ?No changes recommended to symptom management at this time as patient is comfortable.  ? ?Code Status: ? ?  ?Code Status Orders  ?(From admission, onward)  ?  ? ? ?  ? ?  Start     Ordered  ? 01/31/22 1622  Do not attempt resuscitation (DNR)  Continuous       ?Question Answer Comment  ?In the event of cardiac or respiratory ARREST Do not call a ?code blue?   ?In the event of cardiac or respiratory ARREST Do not perform Intubation, CPR, defibrillation or ACLS   ?In the event of cardiac or respiratory ARREST Use medication by any route, position, wound care, and other measures to relive pain and suffering. May use oxygen, suction and manual treatment of airway obstruction as needed for comfort.   ?  ? 01/31/22 1622  ? ?  ?  ? ?  ? ?Code Status History   ? ? Date Active Date Inactive Code Status Order ID Comments User Context  ? 01/26/2022 1606 01/31/2022 1622 Full Code [id number]  Ivor Costa, MD ED  ? 11/06/2021 2011 11/12/2021 1916 Full Code [id number]  Collier Bullock, MD Inpatient  ? 07/27/2021 1321 08/02/2021 2249 Full Code [id number]  Ivor Costa,  MD ED  ? 06/09/2021 1039 06/13/2021 2348 Full Code [id number]  Collier Bullock, MD ED  ? 05/18/2021 0539 05/26/2021 2146 Full Code [id number]  Rise Patience, MD ED  ? 05/12/2021 2306 05/15/2021 2240 Full Code [id number]  Clance Boll, MD ED  ? 02/06/2021 2254 02/10/2021 2225 Full Code [id number]  Athena Masse, MD ED  ? 11/18/2020 1508 11/25/2020 1534 Full Code [id number]  Ivor Costa, MD ED  ? 09/12/2020 1917 09/14/2020 2156 Full Code [id number]  Para Skeans, MD ED  ? 01/05/2019 2016 01/08/2019 1759 Full Code [id number]  Fritzi Mandes, MD Inpatient  ? 12/04/2017 0511 12/12/2017 2355 Full Code [id number]  SalaryAvel Peace, MD Inpatient  ? 08/22/2017 1244 08/22/2017 1800 Full Code [id number]  Algernon Huxley, MD Inpatient  ? 04/21/2017 1819 04/25/2017 1915 Full Code [id number]  Dustin Flock, MD Inpatient  ? 11/06/2015 2035 11/07/2015 1818 Full Code [id number]  Aldean Jewett, MD ED  ? ?  ? ? ?Prognosis: ? < 2 weeks Stopped dialysis ? ? ? ? ? ?Thank you for allowing the Palliative Medicine Team to assist in the care of this patient. ? ? ?Asencion Gowda, NP ? ?Please contact Palliative Medicine Team phone at [phone] for questions and concerns.  ? ? ? ? ? ?

## 2022-02-02 NOTE — Progress Notes (Addendum)
? ? ? ?Progress Note  ? ? ?[name]  DEY:[id number] DOB: 1961-08-12  DOA: 01/16/2022 ?PCP: [name], MD  ? ? ? ? ?Brief Narrative:  ? ? ?Medical records reviewed and are as summarized below: ? ?[name] is a 61 y.o. male with medical history significant for hypertension, type 2 diabetes mellitus, ESRD on hemodialysis, seizure disorder, history of stroke, chronic systolic and diastolic CHF, chronic anemia, hyperlipidemia, hidradenitis suppurativa.  He was brought to the hospital (from outpatient dialysis unit) because of unresponsiveness during hemodialysis. ? ?He was admitted to the hospital for acute hypoxic respiratory failure from acute on chronic systolic and diastolic CHF, mucous plugging and probable pneumonia.  Unresponsiveness was attributed to acute metabolic encephalopathy.  He required intubation and mechanical ventilation.  He was treated with empiric IV antibiotics.  He underwent bronchoscopy with aspiration of mucous plug.  He was seen by the nephrologist for hemodialysis for CHF exacerbation and ESRD. ? ?He also has hidradenitis suppurativa which was treated with IV Unasyn.  He was seen by the psychiatrist for depression and he has been started on mirtazapine.  He was transferred from the ICU to the hospitalist service on 01/29/2022. ? ?Goals of care were discussed.  Patient decided to be a DO NOT RESUSCITATE and opted for comfort measures with hospice.  Hospice team was consulted and patient is deemed eligible for discharge to hospice house.  His family is in agreement with the plan. ? ? ?Assessment/Plan:  ? ?Principal Problem: ?  Unresponsiveness ?Active Problems: ?  Acute respiratory failure with hypoxia ([place]) ?  Mucus plugging of bronchi ?  Essential hypertension ?  Crohn disease ([place]) ?  Hidradenitis suppurativa ?  ESRD (end stage renal disease) ([place]) ?  History of CVA (cerebrovascular accident) ?  Seizure disorder ([place]) ?  Chronic systolic CHF (congestive heart failure)  ([place]) ?  Anemia in ESRD (end-stage renal disease) ([place]) ?  Aspiration pneumonia ([place]) ?  HLD (hyperlipidemia) ?  Elevated troponin ?  Depression ? ? ?Nutrition Problem: Severe Malnutrition ?Etiology: chronic illness (ESRD on HD, DM, Crohn's disease, CHF, CVA) ? ?Signs/Symptoms: severe fat depletion, severe muscle depletion ? ? ?Body mass index is 17.37 kg/m?.  (Underweight) ? ?Acute hypoxic respiratory failure ?Acute on chronic systolic and diastolic CHF ?ESRD on hemodialysis ?Probable aspiration pneumonia ?Mucous plug of bronchi ?Elevated troponin ?Hidradenitis suppurativa ?Acute metabolic encephalopathy/unresponsiveness ?Generalized weakness ?Severe protein caloric malnutrition ?Seizure disorder ?History of stroke ?Anemia of chronic disease ?Hyperlipidemia ?Hypertension ?Type 2 diabetes mellitus ?Depression ? ? ? ?PLAN ? ?Continue IV Dilaudid as needed for pain and IV Ativan as needed for anxiety. ?Continue comfort care measures. ?Awaiting placement to hospice facility.  Likely discharge to hospice facility tomorrow. ? ? ? ?Diet Order   ? ?       ?  DIET DYS 3 Room service appropriate? Yes with Assist; Fluid consistency: Thin  Diet effective now       ?  ? ?  ?  ? ?  ? ? ? ? ? ?Consultants: ?Intensivist ?Nephrologist ?Infectious disease ?Palliative care and hospice ? ?Procedures: ?Bronchoscopy on 01/26/2022 ?Intubation on 01/26/2022 ? ? ? ?Medications:  ? ? chlorhexidine  15 mL Mouth Rinse BID  ? Chlorhexidine Gluconate Cloth  6 each Topical Q0600  ? Chlorhexidine Gluconate Cloth  6 each Topical Q0600  ? feeding supplement (NEPRO CARB STEADY)  237 mL Oral TID BM  ? glycopyrrolate  0.1 mg Intravenous BID  ? HYDROcodone-acetaminophen  1 tablet Oral  Daily  ? mouth rinse  15 mL Mouth Rinse q12n4p  ? ?Continuous Infusions: ? ? ? ? ?Anti-infectives (From admission, onward)  ? ? Start     Dose/Rate Route Frequency Ordered Stop  ? 01/31/22 1200  Ampicillin-Sulbactam (UNASYN) 3 g in sodium chloride 0.9 % 100 mL IVPB  Status:   Discontinued       ? 3 g ?200 mL/hr over 30 Minutes Intravenous Every 12 hours 01/31/22 1040 01/31/22 1044  ? 01/31/22 1130  Ampicillin-Sulbactam (UNASYN) 3 g in sodium chloride 0.9 % 100 mL IVPB  Status:  Discontinued       ? 3 g ?200 mL/hr over 30 Minutes Intravenous Every 24 hours 01/31/22 1043 01/31/22 1622  ? 01/30/22 2200  linezolid (ZYVOX) tablet 600 mg  Status:  Discontinued       ?See Hyperspace for full Linked Orders Report.  ? 600 mg Oral Every 12 hours 01/30/22 0646 01/31/22 1622  ? 01/30/22 1800  Ampicillin-Sulbactam (UNASYN) 3 g in sodium chloride 0.9 % 100 mL IVPB  Status:  Discontinued       ? 3 g ?200 mL/hr over 30 Minutes Intravenous Every 24 hours 01/30/22 1148 01/31/22 1040  ? 01/30/22 1300  Ampicillin-Sulbactam (UNASYN) 3 g in sodium chloride 0.9 % 100 mL IVPB  Status:  Discontinued       ? 3 g ?200 mL/hr over 30 Minutes Intravenous Every 8 hours 01/30/22 1147 01/30/22 1148  ? 01/30/22 1000  linezolid (ZYVOX) IVPB 600 mg       ?See Hyperspace for full Linked Orders Report.  ? 600 mg ?300 mL/hr over 60 Minutes Intravenous Every 12 hours 01/30/22 0646 01/30/22 1118  ? 01/29/22 1600  linezolid (ZYVOX) tablet 600 mg  Status:  Discontinued       ? 600 mg Oral Every 12 hours 01/29/22 1428 01/30/22 0646  ? 01/26/22 1800  Ampicillin-Sulbactam (UNASYN) 3 g in sodium chloride 0.9 % 100 mL IVPB  Status:  Discontinued       ? 3 g ?200 mL/hr over 30 Minutes Intravenous Every 24 hours 01/26/22 0850 01/30/22 1147  ? 01/31/2022 1600  cefTRIAXone (ROCEPHIN) 1 g in sodium chloride 0.9 % 100 mL IVPB  Status:  Discontinued       ? 1 g ?200 mL/hr over 30 Minutes Intravenous Every 24 hours 01/18/2022 1549 01/26/22 0829  ? 01/23/2022 1600  azithromycin (ZITHROMAX) 500 mg in sodium chloride 0.9 % 250 mL IVPB  Status:  Discontinued       ? 500 mg ?250 mL/hr over 60 Minutes Intravenous Every 24 hours 01/27/2022 1549 01/26/22 0829  ? 02/07/2022 1545  cefTRIAXone (ROCEPHIN) 2 g in sodium chloride 0.9 % 100 mL IVPB  Status:   Discontinued       ? 2 g ?200 mL/hr over 30 Minutes Intravenous  Once 02/01/2022 1530 01/29/2022 1548  ? 01/19/2022 1545  azithromycin (ZITHROMAX) 500 mg in sodium chloride 0.9 % 250 mL IVPB  Status:  Discontinued       ? 500 mg ?250 mL/hr over 60 Minutes Intravenous  Once 01/28/2022 1530 01/24/2022 1548  ? 01/21/2022 1530  levofloxacin (LEVAQUIN) IVPB 750 mg  Status:  Discontinued       ? 750 mg ?100 mL/hr over 90 Minutes Intravenous  Once 01/30/2022 1519 01/19/2022 1530  ? ?  ? ? ? ? ? ? ? ? ? ?Family Communication/Anticipated D/C date and plan/Code Status  ? ?DVT prophylaxis:  ? ? ?  Code Status: DNR ? ?  Family Communication: None ?Disposition Plan: Plan to discharge to hospice house tomorrow ? ? ?Status is: Inpatient ?Remains inpatient appropriate because: Awaiting placement to hospice ? ? ? ? ? ?Subjective:  ? ?No complaints.  No pain.  He feels comfortable. ? ?Objective:  ? ? ?Vitals:  ? 02/01/22 0819 02/01/22 1944 02/01/22 2343 02/02/22 0940  ?BP: (!) 151/73 (!) 137/58 (!) 143/74   ?Pulse: 68 69 78   ?Resp: 12 16 19 16   ?Temp: 98.4 ?F (36.9 ?C) 98.2 ?F (36.8 ?C) 98.2 ?F (36.8 ?C)   ?TempSrc: Oral     ?SpO2: (!) 84% 92% (!) 89%   ?Weight:      ?Height:      ? ?No data found. ? ? ?Intake/Output Summary (Last 24 hours) at 02/02/2022 1609 ?Last data filed at 02/02/2022 1300 ?Gross per 24 hour  ?Intake 0 ml  ?Output --  ?Net 0 ml  ? ?Filed Weights  ? 01/29/22 1245 01/30/22 0530 01/31/22 0527  ?Weight: 54.8 kg 55.7 kg 56.5 kg  ? ? ?Exam: ? ?GEN: NAD, frail and ill looking ?SKIN: Warm and dry ?EYES: No pallor or icterus ?ENT: MMM ?CV: RRR ?PULM: CTA B ?ABD: soft, ND, NT, +BS ?CNS: AAO x 3, non focal ?EXT: Bilateral BKA ? ? ? ? ? ?Data Reviewed:  ? ?I have personally reviewed following labs and imaging studies: ? ?Labs: ?Labs show the following:  ? ?Basic Metabolic Panel: ?Recent Labs  ?Lab 01/27/22 ?2902 01/28/22 ?2154 01/29/22 ?1115 01/30/22 ?0502 01/31/22 ?0441  ?NA 135 136 137 137 136  ?K 3.2* 3.4* 3.5 3.1* 3.3*  ?CL 99 98 98 99 99   ?CO2 27 29 30 30 30   ?GLUCOSE 72 138* 115* 78 100*  ?BUN 16 19 22* 16 24*  ?CREATININE 2.47* 2.49* 2.77* 1.97* 2.88*  ?CALCIUM 8.0* 8.0* 8.6* 8.4* 8.1*  ?MG  --  1.9  --  1.7  --   ?PHOS 2.9 3.3  --  2.6  --

## 2022-02-02 NOTE — Progress Notes (Signed)
[facility]) Hospital Liaison Note ?  ?Unfortunately, Hospice Home is not able to offer a room today. Plan is for patient to transfer to [facility] tomorrow as family is completing consents today. Family and Ashley/TOC Manager aware hospital liaison will follow up tomorrow or sooner if a room becomes available.  ?  ?Please do not hesitate to call with any hospice related questions.  ?  ?Thank you for the opportunity to participate in this patient's care. ?  ?Daphene Calamity, MSW ?[place] ?[phone] ? ?

## 2022-02-02 NOTE — Progress Notes (Signed)
Patient daily care performed. Wound dressing change completed for patient's left buttock and sacral foam applied. CHG bath completed and full linen and gown change. ?

## 2022-02-02 NOTE — Consult Note (Signed)
[facility] CM Inpatient Consult ? ? ?02/02/2022 ? ?[place] B [place]niers ?[birth year] ?[id number] ? ?[facility] CM) ?  ?Patient chart reviewed for potential [facility] CM services with noted high risk score for unplanned readmission. Per review, disposition is for hospice care. No [facility] CM needs. ? ?Of note, [facility] Care Management services does not replace or interfere with any services that are arranged by inpatient case management or social work.  ?  ?Netta Cedars, MSN, RN ?[facility] Liaison ?Phone [phone] ?Toll free office [phone] ?

## 2022-02-03 DIAGNOSIS — I5043 Acute on chronic combined systolic (congestive) and diastolic (congestive) heart failure: Secondary | ICD-10-CM

## 2022-02-04 ENCOUNTER — Ambulatory Visit (INDEPENDENT_AMBULATORY_CARE_PROVIDER_SITE_OTHER): Payer: Medicare Other | Admitting: Vascular Surgery

## 2022-02-04 ENCOUNTER — Encounter (INDEPENDENT_AMBULATORY_CARE_PROVIDER_SITE_OTHER): Payer: Medicare Other

## 2022-02-15 NOTE — Significant Event (Signed)
? ?      CROSS COVER NOTE ? ?NAME: [name] ?MRN: [id number] ?DOB : 11-16-1960 ? ? ? ? ?Notified by RN that patient has expired at 0333 ? ?Patient was DNR and comfort care ? ?2 RN verified. ? ?Family was not present; RN will contact wife. ? ? ?Neomia Glass MHA, MSN, FNP-BC ?Nurse Practitioner ?[facility] ?[place] ?Pager [phone] ? ?

## 2022-02-15 NOTE — Progress Notes (Signed)
?   2022-02-15 0333  ?Attending Physican Contact  ?Attending Physician Notified Y  ?Attending Physician (First and Last Name) Neomia Glass, NP  ?Post Mortem Checklist  ?Date of Death 15-Feb-2022  ?Time of Death [phone]  ?Pronounced By Eliseo Squires, RN  ?Next of kin notified Yes  ?Name of next of kin notified of death [name] Bellisario  ?Contact Person's Relationship to Patient Spouse  ?Contact Person's Phone Number [phone]  ?Contact Person's address [address] [place], [place] [address]  ?Family Communication Notes N/A  ?Was the patient a No Code Blue or a Limited Code Blue? Yes  ?Did the patient die unattended? Yes  ?Patient restrained? Not applicable  ?Height 5' 11"  (1.803 m)  ?Weight 56.5 kg  ?Body preparation complete Y  ?[facility] (previously known as [employer])  ?Notification Date 2022-02-15  ?Notification Time 0352  ?[facility] Number [phone]  ?Is patient a potential donor? N  ?Autopsy  ?Autopsy requested by N/A  ?Patient and [place] Returned  ?Patient is satisfied that all belongings have been returned? Not applicable  ?Dermatherapy linen/gowns NOT sent with patient or transporter Not applicable  ?Medical Examiner  ?Is this a medical examiner's case? N  ?Funeral Home  ?Funeral home name/address/phone #  ?[facility] [address] [place], [place])  ?Planned location of pickup Morgue  ? ?Post Mortem Note: ?Pt expired at 0333. Provider notified. AC at [facility] notified. Family notified. [facility] notified. Post mortem care performed by staff.  ?

## 2022-02-15 NOTE — Death Summary Note (Signed)
? ?DEATH SUMMARY  ? ?Patient Details  ?Name: [name] ?MRN: [id number] ?DOB: 25-Feb-1961 ?JKK:[id number], [name], MD ?Admission/Discharge Information  ? ?Admit Date:  03-Feb-2022  ?Date of Death: Date of Death: 02/12/22  ?Time of Death: Time of Death: 0333  ?Length of Stay: 9  ? ?Principle Cause of death: Congestive heart failure ? ?Hospital Diagnoses: ?Principal Problem: ?  Acute on chronic combined systolic and diastolic CHF (congestive heart failure) ([place]) ?Active Problems: ?  Unresponsiveness ?  Acute respiratory failure with hypoxia ([place]) ?  Mucus plugging of bronchi ?  Essential hypertension ?  Crohn disease ([place]) ?  Hidradenitis suppurativa ?  ESRD (end stage renal disease) ([place]) ?  History of CVA (cerebrovascular accident) ?  Seizure disorder ([place]) ?  Anemia in ESRD (end-stage renal disease) ([place]) ?  Aspiration pneumonia ([place]) ?  HLD (hyperlipidemia) ?  Elevated troponin ?  Depression ? ? ?Hospital Course: ? ?[name] is a 61 y.o. male with medical history significant for hypertension, type 2 diabetes mellitus, ESRD on hemodialysis, seizure disorder, history of stroke, chronic systolic and diastolic CHF, chronic anemia, hyperlipidemia, hidradenitis suppurativa.  He was brought to the hospital (from outpatient dialysis unit) because of unresponsiveness during hemodialysis. ?  ?He was admitted to the hospital for acute hypoxic respiratory failure from acute on chronic systolic and diastolic CHF, mucous plugging and probable pneumonia.  Unresponsiveness was attributed to acute metabolic encephalopathy.  He required intubation and mechanical ventilation.  He was treated with empiric IV antibiotics.  He underwent bronchoscopy with aspiration of mucous plug.  He was seen by the nephrologist for hemodialysis for CHF exacerbation and ESRD. ?  ?He also had hidradenitis suppurativa which was treated with IV Unasyn and zyvox.  He was seen by the psychiatrist for depression and he was started on mirtazapine.  He  was transferred from the ICU to the hospitalist service on 01/29/2022. ?  ?Goals of care were discussed.  Patient decided to be a DO NOT RESUSCITATE and opted for comfort measures with hospice.  Hospice team was consulted and he was deemed eligible for discharge to hospice house.  His family was in agreement with the plan. He expired in the hospital while awaiting transfer to hospice house.  ? ? ? ? ?  ? ? ?Procedures: Bronchoscopy, Intubation ? ?Consultations: Intensivist, nephrologist, infectious disease, palliative care, hospice ? ?The results of significant diagnostics from this hospitalization (including imaging, microbiology, ancillary and laboratory) are listed below for reference.  ? ?Significant Diagnostic Studies: ?DG Abd 1 View ? ?Result Date: 01/26/2022 ?CLINICAL DATA:  Status post ET tube placement.  OG tube placement. EXAM: ABDOMEN - 1 VIEW COMPARISON:  None. FINDINGS: Nasogastric tube with the tip projecting over the stomach, but the proximal port is above the esophagogastric junction. Recommend advancing the nasogastric tube 10 cm. No bowel dilatation to suggest obstruction. No evidence of pneumoperitoneum, portal venous gas or pneumatosis. No pathologic calcifications along the expected course of the ureters. No acute osseous abnormality. IMPRESSION: 1. Nasogastric tube with the tip projecting over the stomach, but the proximal port is above the esophagogastric junction. Recommend advancing the nasogastric tube 10 cm. Electronically Signed   By: Kathreen Devoid M.D.   On: 01/26/2022 12:06  ? ?DG Chest Port 1 View ? ?Result Date: 01/27/2022 ?CLINICAL DATA:  61 year old male with respiratory failure. Intubated. EXAM: PORTABLE CHEST 1 VIEW COMPARISON:  Portable chest 01/26/2022 and earlier. FINDINGS: Portable AP semi upright view at 0827 hours. Stable lines and tubes.  Superior mediastinal and left axillary vascular stents redemonstrated. Continued improved right lung ventilation following complete right  hemithorax opacification at 0608 hours yesterday. Suspect regression of mucous plugging. Residual Patchy and confluent perihilar and basilar opacity on that side. Mediastinal contours are stable. Left lung ventilation is stable and within normal limits. No pneumothorax identified. Negative visible bowel gas. Stable visualized osseous structures. IMPRESSION: 1. Regressed mucous plugging and improving right lung ventilation since yesterday morning. Up to moderate residual right perihilar and basilar opacity. 2.  Stable lines and tubes. Electronically Signed   By: Genevie Ann M.D.   On: 01/27/2022 08:45  ? ?DG Chest Port 1 View ? ?Result Date: 01/26/2022 ?CLINICAL DATA:  Status post intubation and OG tube placement EXAM: PORTABLE CHEST - 1 VIEW COMPARISON:  01/26/2022 FINDINGS: Cardiomediastinal silhouette and pulmonary vasculature unchanged in appearance. Slight interval improvement in aeration of the right lung. Left lung remains clear. ET tube tip located 5 cm above the carina. Nasogastric tube terminates in the left upper quadrant. Left axillary and brachiocephalic vein stents again noted. IMPRESSION: 1. Lines and tubes as above. 2. Slight interval improvement in aeration of the right lung with majority of the right hemithorax still opacified. Electronically Signed   By: Miachel Roux M.D.   On: 01/26/2022 12:05  ? ?DG Chest Port 1 View ? ?Result Date: 01/26/2022 ?CLINICAL DATA:  Respiratory distress EXAM: PORTABLE CHEST 1 VIEW COMPARISON:  Portable exam 0608 hours compared to 01/21/2022 FINDINGS: Again identified complete opacification of RIGHT hemithorax with volume loss and mediastinal shift to RIGHT. Abrupt cut off of RIGHT mainstem bronchus. Scattered interstitial infiltrates LEFT lung. No LEFT pleural effusion or pneumothorax. Scattered vascular stents LEFT axilla, LEFT brachiocephalic vein. Again identified displaced fracture of the RIGHT lateral seventh rib. IMPRESSION: Persistent complete opacification of the  RIGHT hemithorax by atelectasis with mediastinal shift to the RIGHT. Abrupt cut off of RIGHT mainstem bronchus, question mucous plugging. Scattered interstitial infiltrates LEFT lung. Electronically Signed   By: Lavonia Dana M.D.   On: 01/26/2022 08:18  ? ?DG Chest Portable 1 View ? ?Result Date: 01/31/2022 ?CLINICAL DATA:  Unresponsive at dialysis. EXAM: PORTABLE CHEST 1 VIEW COMPARISON:  11/09/2021; 11/06/2021; 07/31/2021 FINDINGS: There is obscuration of the right heart border secondary to atelectasis/collapse of the right lung, presumably secondary to mucous plugging. This is associated with expected right-sided volume loss and deviation of the cardiomediastinal structures to the right. Post coronary stent placement. Improved aeration of the left lung with mild residual pulmonary venous congestion. No left-sided pleural effusion. No pneumothorax. Vascular stents overlie the left axilla and left brachiocephalic vein. Postoperative change of the posterior aspect of the right seventh rib. No acute osseous abnormalities. IMPRESSION: 1. Complete opacification of the right lung with associated volume loss, presumably secondary to mucous plugging. 2. Improved aeration of the left lung with mild residual pulmonary venous congestion. Critical Value/emergent results were called by telephone at the time of interpretation on 01/24/2022 at 2:40 pm to provider [facility] , who verbally acknowledged these results. Electronically Signed   By: Sandi Mariscal M.D.   On: 02/11/2022 14:42   ? ?Microbiology: ?Recent Results (from the past 240 hour(s))  ?Culture, Respiratory w Gram Stain     Status: None  ? Collection Time: 01/26/22 12:34 PM  ? Specimen: SPU  ?Result Value Ref Range Status  ? Specimen Description   Final  ?  SPUTUM ?Performed at [facility], [address], [place], [place] [address] ?  ? Special Requests  Final  ?  NONE ?Performed at [facility], [address], [place], [place] [address] ?  ? Gram  Stain   Final  ?  NO SQUAMOUS EPITHELIAL CELLS SEEN ?MODERATE WBC SEEN ?RARE GRAM POSITIVE COCCI ?  ? Culture   Final  ?  FEW Normal respiratory flora-no Staph aureus or Pseudomonas seen ?Performed at [facility]

## 2022-02-15 DEATH — deceased

## 2022-02-16 ENCOUNTER — Ambulatory Visit: Payer: Medicare Other | Admitting: Nurse Practitioner
